# Patient Record
Sex: Female | Born: 1945 | Race: White | Hispanic: No | Marital: Married | Smoking: Current every day smoker
Health system: Southern US, Community
[De-identification: ages and names within clinical notes are randomized; demographics above are authoritative.]

## PROBLEM LIST (undated history)

## (undated) DIAGNOSIS — F439 Reaction to severe stress, unspecified: Secondary | ICD-10-CM

## (undated) DIAGNOSIS — J449 Chronic obstructive pulmonary disease, unspecified: Secondary | ICD-10-CM

## (undated) DIAGNOSIS — I700 Atherosclerosis of aorta: Secondary | ICD-10-CM

## (undated) DIAGNOSIS — M545 Low back pain, unspecified: Secondary | ICD-10-CM

## (undated) DIAGNOSIS — E119 Type 2 diabetes mellitus without complications: Secondary | ICD-10-CM

## (undated) DIAGNOSIS — K219 Gastro-esophageal reflux disease without esophagitis: Secondary | ICD-10-CM

## (undated) DIAGNOSIS — E1140 Type 2 diabetes mellitus with diabetic neuropathy, unspecified: Secondary | ICD-10-CM

## (undated) DIAGNOSIS — F1210 Cannabis abuse, uncomplicated: Secondary | ICD-10-CM

## (undated) DIAGNOSIS — Z7902 Long term (current) use of antithrombotics/antiplatelets: Secondary | ICD-10-CM

## (undated) DIAGNOSIS — G8929 Other chronic pain: Secondary | ICD-10-CM

## (undated) DIAGNOSIS — N200 Calculus of kidney: Secondary | ICD-10-CM

## (undated) DIAGNOSIS — M961 Postlaminectomy syndrome, not elsewhere classified: Secondary | ICD-10-CM

## (undated) DIAGNOSIS — M7542 Impingement syndrome of left shoulder: Secondary | ICD-10-CM

## (undated) DIAGNOSIS — F329 Major depressive disorder, single episode, unspecified: Secondary | ICD-10-CM

## (undated) DIAGNOSIS — I493 Ventricular premature depolarization: Secondary | ICD-10-CM

## (undated) DIAGNOSIS — G4730 Sleep apnea, unspecified: Secondary | ICD-10-CM

## (undated) DIAGNOSIS — Z720 Tobacco use: Secondary | ICD-10-CM

## (undated) DIAGNOSIS — G4733 Obstructive sleep apnea (adult) (pediatric): Secondary | ICD-10-CM

## (undated) DIAGNOSIS — M7582 Other shoulder lesions, left shoulder: Secondary | ICD-10-CM

## (undated) DIAGNOSIS — I447 Left bundle-branch block, unspecified: Secondary | ICD-10-CM

## (undated) DIAGNOSIS — I219 Acute myocardial infarction, unspecified: Secondary | ICD-10-CM

## (undated) DIAGNOSIS — F32A Depression, unspecified: Secondary | ICD-10-CM

## (undated) DIAGNOSIS — I5181 Takotsubo syndrome: Secondary | ICD-10-CM

## (undated) DIAGNOSIS — E785 Hyperlipidemia, unspecified: Secondary | ICD-10-CM

## (undated) DIAGNOSIS — E039 Hypothyroidism, unspecified: Secondary | ICD-10-CM

## (undated) DIAGNOSIS — N281 Cyst of kidney, acquired: Secondary | ICD-10-CM

## (undated) DIAGNOSIS — K5790 Diverticulosis of intestine, part unspecified, without perforation or abscess without bleeding: Secondary | ICD-10-CM

## (undated) DIAGNOSIS — N183 Chronic kidney disease, stage 3 unspecified: Secondary | ICD-10-CM

## (undated) DIAGNOSIS — I2510 Atherosclerotic heart disease of native coronary artery without angina pectoris: Secondary | ICD-10-CM

## (undated) DIAGNOSIS — I10 Essential (primary) hypertension: Secondary | ICD-10-CM

## (undated) DIAGNOSIS — H269 Unspecified cataract: Secondary | ICD-10-CM

## (undated) DIAGNOSIS — Z955 Presence of coronary angioplasty implant and graft: Secondary | ICD-10-CM

## (undated) HISTORY — DX: Hypothyroidism, unspecified: E03.9

## (undated) HISTORY — DX: Low back pain, unspecified: M54.50

## (undated) HISTORY — DX: Other chronic pain: G89.29

## (undated) HISTORY — DX: Hyperlipidemia, unspecified: E78.5

## (undated) HISTORY — DX: Major depressive disorder, single episode, unspecified: F32.9

## (undated) HISTORY — DX: Low back pain: M54.5

## (undated) HISTORY — DX: Unspecified cataract: H26.9

## (undated) HISTORY — PX: APPENDECTOMY: SHX54

## (undated) HISTORY — DX: Tobacco use: Z72.0

## (undated) HISTORY — PX: ABDOMINAL HYSTERECTOMY: SHX81

## (undated) HISTORY — DX: Atherosclerotic heart disease of native coronary artery without angina pectoris: I25.10

## (undated) HISTORY — DX: Diverticulosis of intestine, part unspecified, without perforation or abscess without bleeding: K57.90

## (undated) HISTORY — DX: Essential (primary) hypertension: I10

## (undated) HISTORY — DX: Depression, unspecified: F32.A

## (undated) HISTORY — PX: EYE SURGERY: SHX253

## (undated) HISTORY — PX: BACK SURGERY: SHX140

---

## 2002-01-12 ENCOUNTER — Encounter
Admission: RE | Admit: 2002-01-12 | Discharge: 2002-01-12 | Payer: Self-pay | Admitting: Physical Medicine and Rehabilitation

## 2002-01-12 ENCOUNTER — Encounter: Payer: Self-pay | Admitting: Physical Medicine and Rehabilitation

## 2002-02-14 ENCOUNTER — Encounter: Payer: Self-pay | Admitting: Orthopedic Surgery

## 2002-02-21 ENCOUNTER — Inpatient Hospital Stay (HOSPITAL_COMMUNITY): Admission: RE | Admit: 2002-02-21 | Discharge: 2002-02-22 | Payer: Self-pay | Admitting: Orthopedic Surgery

## 2002-02-21 ENCOUNTER — Encounter: Payer: Self-pay | Admitting: Orthopedic Surgery

## 2004-05-22 ENCOUNTER — Ambulatory Visit: Payer: Self-pay | Admitting: Pain Medicine

## 2004-06-20 ENCOUNTER — Ambulatory Visit: Payer: Self-pay | Admitting: Pain Medicine

## 2004-07-18 ENCOUNTER — Ambulatory Visit: Payer: Self-pay | Admitting: Pain Medicine

## 2004-07-29 ENCOUNTER — Ambulatory Visit: Payer: Self-pay | Admitting: Pain Medicine

## 2004-08-13 ENCOUNTER — Ambulatory Visit: Payer: Self-pay | Admitting: Pain Medicine

## 2004-09-16 ENCOUNTER — Ambulatory Visit: Payer: Self-pay | Admitting: Pain Medicine

## 2004-10-10 ENCOUNTER — Ambulatory Visit: Payer: Self-pay | Admitting: Pain Medicine

## 2004-10-30 ENCOUNTER — Ambulatory Visit: Payer: Self-pay | Admitting: Pain Medicine

## 2004-11-14 ENCOUNTER — Ambulatory Visit: Payer: Self-pay | Admitting: Pain Medicine

## 2006-08-13 ENCOUNTER — Ambulatory Visit: Payer: Self-pay | Admitting: Pain Medicine

## 2006-08-26 ENCOUNTER — Ambulatory Visit: Payer: Self-pay | Admitting: Pain Medicine

## 2006-10-08 ENCOUNTER — Ambulatory Visit: Payer: Self-pay | Admitting: Pain Medicine

## 2006-10-15 ENCOUNTER — Ambulatory Visit: Payer: Self-pay | Admitting: Pain Medicine

## 2006-10-19 ENCOUNTER — Ambulatory Visit: Payer: Self-pay | Admitting: Pain Medicine

## 2006-11-28 ENCOUNTER — Inpatient Hospital Stay: Payer: Self-pay | Admitting: Internal Medicine

## 2006-11-28 ENCOUNTER — Other Ambulatory Visit: Payer: Self-pay

## 2006-12-03 ENCOUNTER — Ambulatory Visit: Payer: Self-pay | Admitting: Pain Medicine

## 2006-12-07 ENCOUNTER — Ambulatory Visit: Payer: Self-pay | Admitting: Pain Medicine

## 2006-12-30 ENCOUNTER — Ambulatory Visit: Payer: Self-pay | Admitting: Pain Medicine

## 2007-01-28 ENCOUNTER — Ambulatory Visit: Payer: Self-pay | Admitting: Pain Medicine

## 2007-02-01 ENCOUNTER — Ambulatory Visit: Payer: Self-pay | Admitting: Pain Medicine

## 2007-02-03 ENCOUNTER — Ambulatory Visit: Payer: Self-pay | Admitting: Pain Medicine

## 2007-03-02 ENCOUNTER — Ambulatory Visit: Payer: Self-pay | Admitting: Pain Medicine

## 2007-03-08 ENCOUNTER — Ambulatory Visit: Payer: Self-pay | Admitting: Pain Medicine

## 2007-03-15 ENCOUNTER — Ambulatory Visit: Payer: Self-pay

## 2007-03-18 ENCOUNTER — Ambulatory Visit: Payer: Self-pay | Admitting: Pain Medicine

## 2007-04-30 ENCOUNTER — Ambulatory Visit: Payer: Self-pay | Admitting: Pain Medicine

## 2007-05-10 ENCOUNTER — Ambulatory Visit: Payer: Self-pay | Admitting: Pain Medicine

## 2007-05-27 ENCOUNTER — Ambulatory Visit: Payer: Self-pay | Admitting: Pain Medicine

## 2007-05-31 ENCOUNTER — Ambulatory Visit: Payer: Self-pay | Admitting: Internal Medicine

## 2007-06-02 ENCOUNTER — Ambulatory Visit: Payer: Self-pay | Admitting: Pain Medicine

## 2007-06-09 ENCOUNTER — Ambulatory Visit: Payer: Self-pay | Admitting: Pain Medicine

## 2007-06-22 ENCOUNTER — Ambulatory Visit: Payer: Self-pay | Admitting: Pain Medicine

## 2007-07-07 ENCOUNTER — Ambulatory Visit: Payer: Self-pay | Admitting: Pain Medicine

## 2007-09-02 ENCOUNTER — Ambulatory Visit: Payer: Self-pay | Admitting: Pain Medicine

## 2007-09-22 ENCOUNTER — Ambulatory Visit: Payer: Self-pay | Admitting: Pain Medicine

## 2007-09-29 ENCOUNTER — Ambulatory Visit: Payer: Self-pay | Admitting: Pain Medicine

## 2007-10-20 ENCOUNTER — Ambulatory Visit: Payer: Self-pay | Admitting: Pain Medicine

## 2008-01-18 ENCOUNTER — Ambulatory Visit: Payer: Self-pay | Admitting: Internal Medicine

## 2008-03-17 ENCOUNTER — Encounter
Admission: RE | Admit: 2008-03-17 | Discharge: 2008-03-17 | Payer: Self-pay | Admitting: Physical Medicine & Rehabilitation

## 2009-05-04 ENCOUNTER — Ambulatory Visit: Payer: Self-pay | Admitting: Internal Medicine

## 2009-05-16 ENCOUNTER — Ambulatory Visit: Payer: Self-pay | Admitting: Internal Medicine

## 2009-05-29 ENCOUNTER — Ambulatory Visit (HOSPITAL_COMMUNITY): Admission: RE | Admit: 2009-05-29 | Discharge: 2009-05-29 | Payer: Self-pay | Admitting: Neurology

## 2010-07-21 HISTORY — PX: CORONARY ANGIOPLASTY: SHX604

## 2010-10-23 LAB — GLUCOSE, CAPILLARY: Glucose-Capillary: 142 mg/dL — ABNORMAL HIGH (ref 70–99)

## 2010-12-06 NOTE — Op Note (Signed)
Sarah Payne, Sarah Payne                          ACCOUNT NO.:  [id number]   MEDICAL RECORD NO.:  [id number]                   PATIENT TYPE:  AMB   LOCATION:  DAY                                  FACILITY:  [facility]   PHYSICIAN:  Ronald A. Darrelyn Hillock, M.D.             DATE OF BIRTH:  [birth year]   DATE OF PROCEDURE:  02/21/2002  DATE OF DISCHARGE:                                 OPERATIVE REPORT   SURGEON:  Georges Lynch. Darrelyn Hillock, M.D.   ASSISTANT:  Patricia Nettle, M.D.   PREOPERATIVE DIAGNOSES:  1. Bulging disk L4-5, central.  2. 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   POSTOPERATIVE DIAGNOSES:  1. Bulging disk L4-5, central.  2. 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   OPERATION:  1. Complete bilateral decompressive lumbar laminectomy and foraminotomies.  2. Exploration of the L4-5 disk space.  No diskectomy was done.   DESCRIPTION OF PROCEDURE:  Under general anesthesia, a routine orthopedic  prep and drape of the back was carried out.  The patient had 1 g of IV Ancef  preop.  Two needles were placed in the back for localization purposes.  X-  ray was taken to verify our position.  Once we went down and stripped the  muscle from the lamina and spinous process, the spinous process was tagged  with a Kocher clamp.  Another x-ray was taken to verify our L4-5 position.  I then excised the spinous process of L4 and did a complete decompression of  L4-5 interspace.  We went above and below the space to make sure there was  no further stenosis.  Basically the problem also was a lateral recess  stenosis.  We decompressed the lateral recesses until we were out even with  the pedicles.  We were able to easily identify both nerve roots.  We traced  those out through the foramen.  We did foraminotomies.  The roots were free.  We then examined the disk space.  She had a bulging disk, but there was no  definite rupture, no compression on the root.  We felt  for stability  purposes to leave well enough alone.  The roots were now nicely  decompressed.  We thoroughly irrigated out the area; another x-ray was taken  at the end to verify our exact position.  We marked the disk space with a  D'Errico.  After this, I then loosely applied some thrombin-soaked Gelfoam  over the dura and closed the wound in layers in the usual fashion.  I left  the superior deep part of the wound open for drainage purposes.  Sterile  dressings were applied.  The patient left the operating room in satisfactory  condition.  She had 30 mg IV Toradol prior to discharge from the OR.  Ronald A. Darrelyn Hillock, M.D.    RAG/MEDQ  D:  02/21/2002  T:  02/25/2002  Job:  [id number]

## 2010-12-06 NOTE — H&P (Signed)
[facility]  Patient:    Sarah Payne, Sarah Payne. Visit Number: [id number] MRN: [id number]          Service Type: Attending:  Georges Lynch. Darrelyn Hillock, M.D. Dictated by:   Druscilla Brownie. Shela Nevin, P.A. Adm. Date:  02/21/02                           History and Physical  DATE OF BIRTH:  05/21/1946  CHIEF COMPLAINT:  "Pain in my back and leg."  HISTORY OF PRESENT ILLNESS:  This 65 year old female has been having progressive problems concerning pain in her low back as well as lower extremity.  The patient has had documented lumbar degenerative disk disease at L3-4 and L4-5.  She has been treated by Dr. Sheran Luz for intradiskal as well as other steroid injections to the lumbar spine.  Unfortunately, she has not benefited with surgical intervention; specifically, now with at least two Celebrex per day.  She also has to use Norco 10 mg for her discomfort.  Due to progressive problems and the fact that she is not relieved with pain medications as well as the previous steroid injections, it was felt she would benefit from surgical intervention and is being admitted for a central decompressive lumbar laminectomy L4-5, with microdiskectomy at the same level. Dr. Noel Gerold will assist.  PAST MEDICAL HISTORY:  The patient has been in relatively good health throughout her lifetime.  She now has: 1. Diabetes type 2. 2. Hypothyroidism. 3. Hypertension.  PAST SURGICAL HISTORY: 1. Bilateral oophorectomy. 2. Hysterectomy. 3. Appendectomy.  CURRENT MEDICATIONS: 1. Hydrocodone for pain. 2. Flexeril as a muscle relaxant. 3. Paxil 25 mg 1 q.d. 4. ______ 4 mg/500 mg 1 b.i.d. 5. Synthroid 112 mcg 1 q.d. 6. Protonix 40 mg 1 q.d.  7. Altace 10 mg 1 b.i.d.  8. Celebrex 200 mg b.i.d.  9. Premarin 0.625 mg q.d. 10. Pravachol 40 mg 1 q.d. 11. Paxil CR 25 mg q.d.  ALLERGIES:  No known drug allergies.  SOCIAL HISTORY:  The patient smokes one pack of cigarettes per day.  Has  no intake of ETOH.  FAMILY HISTORY:  Positive for stroke and MI in the mother.  FAMILY PHYSICIAN:  Dr. Shella Spearing in [place].  REVIEW OF SYSTEMS:  CNS:  No seizure disorder, paralysis, double vision.  The patient has a radiculopathy consistent with her spinal stenosis at L4-5. CARDIOVASCULAR:  No chest pain, no angina, no orthopnea.  GASTROINTESTINAL: No nausea, vomiting, melena, bloody stools.  GENITOURINARY:  No discharge, dysuria, hematuria.  MUSCULOSKELETAL:  Primarily in present illness.  PHYSICAL EXAMINATION:  GENERAL:  Alert, cooperative, friendly.  Somewhat uncomfortable 65 year old white female.  VITAL SIGNS:  Blood pressure 142/90, pulse 80, respirations 12.  HEENT:  Normocephalic.  PERRLA.  EOMs intact.  Oropharynx is clear.  CHEST:  Clear to auscultation.  No rhonchi.  No rales.  HEART:  Regular rate and rhythm.  No murmurs are heard.  ABDOMEN:  Soft and nontender.  Liver, spleen not felt.  GENITOURINARY, RECTAL, PELVIC, BREASTS:  Not done, not pertinent to present illness.  ADMISSION DIAGNOSES:  1. Herniated nucleus pulposus centrally with stenosis at L4-5.  2. Hypertension.  3. Hypothyroidism.  4. Type 2 diabetes.  PLAN:  The patient will be admitted for complete decompression lumbar laminectomy with microdiskectomy at L4-5.  Dr. Noel Gerold will assist.  Will probably put her on a sliding scale postoperatively. Dictated by:   Druscilla Brownie. Underwood III, P.A.  Attending:  Georges Lynch. Darrelyn Hillock, M.D. DD:  02/10/02 TD:  02/14/02 Job: [id number]

## 2011-01-31 ENCOUNTER — Ambulatory Visit: Admitting: Internal Medicine

## 2011-02-05 ENCOUNTER — Inpatient Hospital Stay: Admitting: Internal Medicine

## 2011-02-06 DIAGNOSIS — R7989 Other specified abnormal findings of blood chemistry: Secondary | ICD-10-CM

## 2011-02-06 DIAGNOSIS — R079 Chest pain, unspecified: Secondary | ICD-10-CM

## 2011-02-06 DIAGNOSIS — I2510 Atherosclerotic heart disease of native coronary artery without angina pectoris: Secondary | ICD-10-CM

## 2011-02-07 DIAGNOSIS — I214 Non-ST elevation (NSTEMI) myocardial infarction: Secondary | ICD-10-CM

## 2011-02-10 DIAGNOSIS — R0602 Shortness of breath: Secondary | ICD-10-CM

## 2011-02-12 ENCOUNTER — Inpatient Hospital Stay: Admitting: Internal Medicine

## 2011-02-12 DIAGNOSIS — R079 Chest pain, unspecified: Secondary | ICD-10-CM

## 2011-02-24 ENCOUNTER — Ambulatory Visit (INDEPENDENT_AMBULATORY_CARE_PROVIDER_SITE_OTHER): Payer: Medicare Other | Admitting: Cardiovascular Disease

## 2011-02-24 ENCOUNTER — Encounter: Payer: Self-pay | Admitting: Cardiovascular Disease

## 2011-02-24 DIAGNOSIS — I2510 Atherosclerotic heart disease of native coronary artery without angina pectoris: Secondary | ICD-10-CM | POA: Insufficient documentation

## 2011-02-24 DIAGNOSIS — Z720 Tobacco use: Secondary | ICD-10-CM | POA: Insufficient documentation

## 2011-02-24 DIAGNOSIS — E782 Mixed hyperlipidemia: Secondary | ICD-10-CM | POA: Insufficient documentation

## 2011-02-24 DIAGNOSIS — F17200 Nicotine dependence, unspecified, uncomplicated: Secondary | ICD-10-CM

## 2011-02-24 DIAGNOSIS — R42 Dizziness and giddiness: Secondary | ICD-10-CM

## 2011-02-24 DIAGNOSIS — IMO0001 Reserved for inherently not codable concepts without codable children: Secondary | ICD-10-CM

## 2011-02-24 DIAGNOSIS — E785 Hyperlipidemia, unspecified: Secondary | ICD-10-CM | POA: Insufficient documentation

## 2011-02-24 NOTE — Assessment & Plan Note (Signed)
She does have symptoms of dizziness and his orthostatic on today's visit with systolic pressures in the 90s with standing. We have suggested she liberalize her salt intake and monitor her blood pressure. If she continues to be orthostatic, we could consider Florinef.

## 2011-02-24 NOTE — Progress Notes (Signed)
Patient ID: Sarah Payne, female    DOB: 07-15-1946, 65 y.o.   MRN: [id number]  HPI Comments: 65 year old woman with a long history of smoking, coronary artery disease, diabetes depression, hypertension, chronic low back pain who recently slid down several stairs and hurt her back presented to [facility] with nausea vomiting shortness of breath and chest pain, elevated cardiac enzymes, taken to the cardiac Cath Lab showing an occluded mid left circumflex with stent x3 placed all drug-eluting stents with residual moderate to severe proximal to mid LAD disease after the diagonal, moderate distal LAD disease, ejection fraction 50% we presented to [facility] one week later with chest pain with negative stress test at that time for ischemia who now presents to establish care in the office.  She reports that since her last seen her in the hospital, she has not had any further chest pain. She has stopped smoking 2 weeks ago and wears a nicotine patch. She has been dizzy dizzy with standing. She is not taking any medications for blood pressure or heart rate control secondary to hypotension. Overall she feels well apart from fatigue.  Echocardiogram done February 07, 2011 shows normal LV systolic function, diastolic dysfunction, moderate inferior wall hypokinesis, normal right ventricular systolic pressures.  EKG shows normal sinus rhythm with rate 80 beats per minute with T-wave abnormality in leads V3 to V6, one and aVL   Outpatient Encounter Prescriptions as of 02/24/2011  Medication Sig Dispense Refill  . aspirin 81 MG tablet Take 81 mg by mouth daily.        Marland Kitchen atorvastatin (LIPITOR) 80 MG tablet Take 80 mg by mouth daily.        . Calcium & Magnesium Carbonates (MYLANTA PO) Take by mouth.        . clopidogrel (PLAVIX) 75 MG tablet Take 75 mg by mouth daily.        . Cyanocobalamin (VITAMIN B-12) 1000 MCG SUBL Place under the tongue.        . DULoxetine (CYMBALTA) 30 MG capsule Take 30 mg by mouth daily.        Marland Kitchen  glipiZIDE (GLUCOTROL XL) 2.5 MG 24 hr tablet Take 2.5 mg by mouth daily.        Marland Kitchen levothyroxine (SYNTHROID, LEVOTHROID) 125 MCG tablet Take 125 mcg by mouth daily.        . magnesium oxide (MAG-OX) 400 MG tablet Take 400 mg by mouth daily.        . metFORMIN (GLUMETZA) 1000 MG (MOD) 24 hr tablet Take 1,000 mg by mouth 2 (two) times daily with a meal.        . metoCLOPramide (REGLAN) 10 MG tablet Take 10 mg by mouth 4 (four) times daily.        . nicotine (NICODERM CQ - DOSED IN MG/24 HOURS) 14 mg/24hr patch Place 1 patch onto the skin daily.        . nitroGLYCERIN (NITROSTAT) 0.4 MG SL tablet Place 0.4 mg under the tongue every 5 (five) minutes as needed.        . traZODone (DESYREL) 100 MG tablet Take 100 mg by mouth at bedtime.          Review of Systems  Constitutional: Negative.   HENT: Negative.   Eyes: Negative.   Respiratory: Negative.   Cardiovascular: Negative.   Gastrointestinal: Negative.   Musculoskeletal: Negative.   Skin: Negative.   Neurological: Negative.   Hematological: Negative.   Psychiatric/Behavioral: Negative.   All other systems  reviewed and are negative.    BP 93/66  Pulse 91  Ht 5\' 7"  (1.702 m)  Wt 134 lb (60.782 kg)  BMI 20.99 kg/m2  Physical Exam  Nursing note and vitals reviewed. Constitutional: She is oriented to person, place, and time. She appears well-developed and well-nourished.  HENT:  Head: Normocephalic.  Nose: Nose normal.  Mouth/Throat: Oropharynx is clear and moist.  Eyes: Conjunctivae are normal. Pupils are equal, round, and reactive to light.  Neck: Normal range of motion. Neck supple. No JVD present.  Cardiovascular: Normal rate, regular rhythm, S1 normal, S2 normal, normal heart sounds and intact distal pulses.  Exam reveals no gallop and no friction rub.   No murmur heard. Pulmonary/Chest: Effort normal and breath sounds normal. No respiratory distress. She has no wheezes. She has no rales. She exhibits no tenderness.    Abdominal: Soft. Bowel sounds are normal. She exhibits no distension. There is no tenderness.  Musculoskeletal: Normal range of motion. She exhibits no edema and no tenderness.  Lymphadenopathy:    She has no cervical adenopathy.  Neurological: She is alert and oriented to person, place, and time. Coordination normal.  Skin: Skin is warm and dry. No rash noted. No erythema.  Psychiatric: She has a normal mood and affect. Her behavior is normal. Judgment and thought content normal.         Assessment and Plan

## 2011-02-24 NOTE — Patient Instructions (Signed)
You are doing well. No medication changes were made. Please call us if you have new issues that need to be addressed before your next appt.  We will call you for a follow up Appt. In 6 months  

## 2011-02-24 NOTE — Assessment & Plan Note (Signed)
She stopped smoking 2 weeks ago. We have congratulated her on smoking cessation

## 2011-02-24 NOTE — Assessment & Plan Note (Signed)
No further episodes of chest pain. Will continue aggressive medical management.

## 2011-02-24 NOTE — Assessment & Plan Note (Signed)
We have suggested she check her cholesterol in several months time on the higher dose Lipitor.

## 2011-03-11 ENCOUNTER — Ambulatory Visit (INDEPENDENT_AMBULATORY_CARE_PROVIDER_SITE_OTHER): Payer: Medicare Other | Admitting: Internal Medicine

## 2011-03-11 ENCOUNTER — Encounter: Payer: Self-pay | Admitting: Internal Medicine

## 2011-03-11 VITALS — BP 126/80 | HR 66 | Temp 98.6°F | Resp 14 | Ht 66.0 in | Wt 125.5 lb

## 2011-03-11 DIAGNOSIS — R5381 Other malaise: Secondary | ICD-10-CM

## 2011-03-11 DIAGNOSIS — IMO0001 Reserved for inherently not codable concepts without codable children: Secondary | ICD-10-CM

## 2011-03-11 DIAGNOSIS — R42 Dizziness and giddiness: Secondary | ICD-10-CM

## 2011-03-11 DIAGNOSIS — I2510 Atherosclerotic heart disease of native coronary artery without angina pectoris: Secondary | ICD-10-CM

## 2011-03-11 DIAGNOSIS — E039 Hypothyroidism, unspecified: Secondary | ICD-10-CM

## 2011-03-11 DIAGNOSIS — Z79899 Other long term (current) drug therapy: Secondary | ICD-10-CM

## 2011-03-11 DIAGNOSIS — R5383 Other fatigue: Secondary | ICD-10-CM

## 2011-03-11 DIAGNOSIS — E785 Hyperlipidemia, unspecified: Secondary | ICD-10-CM

## 2011-03-11 DIAGNOSIS — E119 Type 2 diabetes mellitus without complications: Secondary | ICD-10-CM

## 2011-03-11 DIAGNOSIS — F17200 Nicotine dependence, unspecified, uncomplicated: Secondary | ICD-10-CM

## 2011-03-11 NOTE — Progress Notes (Signed)
  Subjective:    Patient ID: Sarah Payne, female    DOB: [birth year], 65 y.o.   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  She had a readmission for severe hypotension secondary to medications and was discharged withouta  betablocker and ACE Inhibitor.  She has had her hospital followup with Dr. Mariah Milling who made no medication changes. She feels generally fatigued but has no dyspnea or chest pain since last discharged.  She quit smoking after her AMI .  Appetite is good.  No LE swelling .    Blood sugars have been well controlled.  Review of Systems  Constitutional: Positive for fatigue. Negative for fever, chills and appetite change.  HENT: Negative for hearing loss, ear pain, nosebleeds, congestion, sore throat, facial swelling, rhinorrhea, sneezing, mouth sores, trouble swallowing, neck pain, neck stiffness, voice change, postnasal drip, sinus pressure, tinnitus and ear discharge.   Eyes: Negative for pain, discharge, redness and visual disturbance.  Respiratory: Negative for cough, chest tightness, shortness of breath, wheezing and stridor.   Cardiovascular: Negative for chest pain, palpitations and leg swelling.  Genitourinary: Negative for frequency.  Musculoskeletal: Positive for back pain. Negative for myalgias and arthralgias.  Skin: Negative for color change and rash.  Neurological: Positive for weakness. Negative for dizziness, light-headedness and headaches.  Hematological: Negative for adenopathy. Bruises/bleeds easily.  Psychiatric/Behavioral: Negative for sleep disturbance. The patient is not nervous/anxious.        Objective:   Physical Exam  Constitutional: She is oriented to person, place, and time. She appears well-developed and well-nourished. No distress.  Eyes: EOM are normal. Pupils are equal, round, and reactive to light.  Neck: Normal range of  motion. Neck supple. No JVD present. No thyromegaly present.  Cardiovascular: Normal rate, regular rhythm, normal heart sounds and intact distal pulses.   Pulmonary/Chest: Effort normal and breath sounds normal. She exhibits no tenderness.  Abdominal: Soft. Bowel sounds are normal.  Musculoskeletal: Normal range of motion. She exhibits no edema.  Neurological: She is alert and oriented to person, place, and time.  Skin: Skin is warm and dry. She is not diaphoretic.  Psychiatric: She has a normal mood and affect.          Assessment & Plan:

## 2011-03-11 NOTE — Patient Instructions (Signed)
Try reducing the trazadone to 1/2 tablet at bedtime . We will refer you for a sleep study and for cardiopulmonary rehabilitation. Return for fasting bloodwork at your convenience.

## 2011-03-12 DIAGNOSIS — R5383 Other fatigue: Secondary | ICD-10-CM | POA: Insufficient documentation

## 2011-03-12 NOTE — Assessment & Plan Note (Signed)
Applauded her for her tobacco cessation.  Continue nicotine patches as needed

## 2011-03-12 NOTE — Assessment & Plan Note (Signed)
Currently asymptomatic.  BP still too soft to start beta blocker or ACE inhibitor.

## 2011-03-12 NOTE — Assessment & Plan Note (Signed)
Secondary to oversedation with trazadone as well as deconditioning and history of DM with vascular dementia all likely contributing.  Decrease trazadone dose.

## 2011-03-12 NOTE — Assessment & Plan Note (Signed)
Repeat lipids due on current lipitor dose.

## 2011-03-18 ENCOUNTER — Other Ambulatory Visit: Payer: Self-pay | Admitting: Internal Medicine

## 2011-03-18 ENCOUNTER — Other Ambulatory Visit (INDEPENDENT_AMBULATORY_CARE_PROVIDER_SITE_OTHER): Payer: Medicare Other | Admitting: *Deleted

## 2011-03-18 DIAGNOSIS — E039 Hypothyroidism, unspecified: Secondary | ICD-10-CM

## 2011-03-18 DIAGNOSIS — E119 Type 2 diabetes mellitus without complications: Secondary | ICD-10-CM

## 2011-03-18 DIAGNOSIS — I2510 Atherosclerotic heart disease of native coronary artery without angina pectoris: Secondary | ICD-10-CM

## 2011-03-18 DIAGNOSIS — Z79899 Other long term (current) drug therapy: Secondary | ICD-10-CM

## 2011-03-18 LAB — COMPREHENSIVE METABOLIC PANEL
ALT: 16 U/L (ref 0–35)
AST: 19 U/L (ref 0–37)
Albumin: 4.1 g/dL (ref 3.5–5.2)
Alkaline Phosphatase: 66 U/L (ref 39–117)
BUN: 21 mg/dL (ref 6–23)
CO2: 28 mEq/L (ref 19–32)
Calcium: 9.2 mg/dL (ref 8.4–10.5)
Chloride: 101 mEq/L (ref 96–112)
Creatinine, Ser: 1 mg/dL (ref 0.4–1.2)
GFR: 60.2 mL/min (ref >60.00)
Glucose, Bld: 157 mg/dL — ABNORMAL HIGH (ref 70–99)
Potassium: 4 mEq/L (ref 3.5–5.1)
Sodium: 140 mEq/L (ref 135–145)
Total Bilirubin: 0.4 mg/dL (ref 0.3–1.2)
Total Protein: 7.2 g/dL (ref 6.0–8.3)

## 2011-03-18 LAB — LIPID PANEL
Cholesterol: 203 mg/dL — ABNORMAL HIGH (ref 0–200)
HDL: 61 mg/dL (ref >39.00)
Total CHOL/HDL Ratio: 3
Triglycerides: 206 mg/dL — ABNORMAL HIGH (ref 0.0–149.0)
VLDL: 41.2 mg/dL — ABNORMAL HIGH (ref 0.0–40.0)

## 2011-03-18 LAB — TSH: TSH: 63.8 u[IU]/mL — ABNORMAL HIGH (ref 0.35–5.50)

## 2011-03-18 LAB — HEMOGLOBIN A1C: Hgb A1c MFr Bld: 7.4 % — ABNORMAL HIGH (ref 4.6–6.5)

## 2011-03-18 LAB — LDL CHOLESTEROL, DIRECT: Direct LDL: 123 mg/dL

## 2011-03-18 LAB — CK: Total CK: 99 U/L (ref 7–177)

## 2011-03-18 NOTE — Telephone Encounter (Signed)
WANT REFILL PAIN MEDS  [facility] [address]

## 2011-03-19 MED ORDER — LEVOTHYROXINE SODIUM 150 MCG PO TABS: 150.0000 ug | ORAL_TABLET | Freq: Every day | ORAL | Status: DC

## 2011-03-19 NOTE — Progress Notes (Signed)
Quick Note:  Thyroid function is very underactive on current synthroid dose. We need to increase her dose immediately. Confirm that she is taking it in the AM 30 minutes prior to other medications and all food. I will e mail new rx to pharmacy ______ 

## 2011-03-19 NOTE — Progress Notes (Signed)
Addended by: Duncan Dull on: 03/19/2011 06:38 PM   Modules accepted: Orders

## 2011-03-27 NOTE — Telephone Encounter (Signed)
I called pharmacy they stated her last refill was 01/14/11.

## 2011-03-28 MED ORDER — HYDROCODONE-ACETAMINOPHEN 5-500 MG PO TABS: 1.0000 | ORAL_TABLET | Freq: Two times a day (BID) | ORAL | Status: DC | PRN

## 2011-04-16 ENCOUNTER — Other Ambulatory Visit: Payer: Self-pay | Admitting: Internal Medicine

## 2011-04-16 MED ORDER — DONEPEZIL HCL 5 MG PO TABS: 5.0000 mg | ORAL_TABLET | Freq: Every day | ORAL | Status: DC

## 2011-04-22 ENCOUNTER — Encounter: Admitting: Cardiovascular Disease

## 2011-04-25 ENCOUNTER — Telehealth: Payer: Self-pay | Admitting: Internal Medicine

## 2011-04-25 NOTE — Telephone Encounter (Signed)
Patient needs to make an appt prior to an x ray being ordered.

## 2011-04-25 NOTE — Telephone Encounter (Signed)
[phone]  Pt called to get rx for her lower back pain she also would like  to get a xray on her back . [facility]

## 2011-04-28 NOTE — Telephone Encounter (Signed)
Appointment 04/30/11 with dr Darrick Huntsman

## 2011-04-30 ENCOUNTER — Ambulatory Visit (INDEPENDENT_AMBULATORY_CARE_PROVIDER_SITE_OTHER): Payer: Medicare Other | Admitting: Internal Medicine

## 2011-04-30 ENCOUNTER — Encounter: Payer: Self-pay | Admitting: Internal Medicine

## 2011-04-30 DIAGNOSIS — E039 Hypothyroidism, unspecified: Secondary | ICD-10-CM

## 2011-04-30 DIAGNOSIS — R5383 Other fatigue: Secondary | ICD-10-CM

## 2011-04-30 DIAGNOSIS — F329 Major depressive disorder, single episode, unspecified: Secondary | ICD-10-CM

## 2011-04-30 DIAGNOSIS — F17200 Nicotine dependence, unspecified, uncomplicated: Secondary | ICD-10-CM

## 2011-04-30 DIAGNOSIS — M549 Dorsalgia, unspecified: Secondary | ICD-10-CM

## 2011-04-30 DIAGNOSIS — F32A Depression, unspecified: Secondary | ICD-10-CM

## 2011-04-30 DIAGNOSIS — E785 Hyperlipidemia, unspecified: Secondary | ICD-10-CM

## 2011-04-30 DIAGNOSIS — R5381 Other malaise: Secondary | ICD-10-CM

## 2011-04-30 LAB — CBC WITH DIFFERENTIAL/PLATELET
Basophils Absolute: 0 10*3/uL (ref 0.0–0.1)
Basophils Relative: 0.3 % (ref 0.0–3.0)
Eosinophils Absolute: 0.1 10*3/uL (ref 0.0–0.7)
Eosinophils Relative: 1 % (ref 0.0–5.0)
HCT: 39.5 % (ref 36.0–46.0)
Hemoglobin: 12.9 g/dL (ref 12.0–15.0)
Lymphocytes Relative: 34.1 % (ref 12.0–46.0)
Lymphs Abs: 2.1 10*3/uL (ref 0.7–4.0)
MCHC: 32.7 g/dL (ref 30.0–36.0)
MCV: 96.6 fl (ref 78.0–100.0)
Monocytes Absolute: 0.4 10*3/uL (ref 0.1–1.0)
Monocytes Relative: 7.3 % (ref 3.0–12.0)
Neutro Abs: 3.5 10*3/uL (ref 1.4–7.7)
Neutrophils Relative %: 57.3 % (ref 43.0–77.0)
Platelets: 301 10*3/uL (ref 150.0–400.0)
RBC: 4.08 Mil/uL (ref 3.87–5.11)
RDW: 14 % (ref 11.5–14.6)
WBC: 6.1 10*3/uL (ref 4.5–10.5)

## 2011-04-30 LAB — LIPID PANEL
Cholesterol: 150 mg/dL (ref 0–200)
HDL: 41.5 mg/dL (ref >39.00)
LDL Cholesterol: 70 mg/dL (ref 0–99)
Total CHOL/HDL Ratio: 4
Triglycerides: 193 mg/dL — ABNORMAL HIGH (ref 0.0–149.0)
VLDL: 38.6 mg/dL (ref 0.0–40.0)

## 2011-04-30 LAB — COMPREHENSIVE METABOLIC PANEL
ALT: 12 U/L (ref 0–35)
AST: 14 U/L (ref 0–37)
Albumin: 4.2 g/dL (ref 3.5–5.2)
Alkaline Phosphatase: 63 U/L (ref 39–117)
BUN: 20 mg/dL (ref 6–23)
CO2: 27 mEq/L (ref 19–32)
Calcium: 9.2 mg/dL (ref 8.4–10.5)
Chloride: 103 mEq/L (ref 96–112)
Creatinine, Ser: 0.8 mg/dL (ref 0.4–1.2)
GFR: 76.96 mL/min (ref >60.00)
Glucose, Bld: 144 mg/dL — ABNORMAL HIGH (ref 70–99)
Potassium: 4.4 mEq/L (ref 3.5–5.1)
Sodium: 140 mEq/L (ref 135–145)
Total Bilirubin: 0.2 mg/dL — ABNORMAL LOW (ref 0.3–1.2)
Total Protein: 7.3 g/dL (ref 6.0–8.3)

## 2011-04-30 LAB — TSH: TSH: 0.73 u[IU]/mL (ref 0.35–5.50)

## 2011-04-30 MED ORDER — MIRTAZAPINE 15 MG PO TABS: 15.0000 mg | ORAL_TABLET | Freq: Every day | ORAL | Status: DC

## 2011-04-30 MED ORDER — OXYCODONE-ACETAMINOPHEN 7.5-325 MG PO TABS: 1.0000 | ORAL_TABLET | Freq: Every evening | ORAL | Status: DC | PRN

## 2011-04-30 NOTE — Patient Instructions (Signed)
Dr. Darrick Huntsman is adding mirtazipine at bedtime to help your depression and your energery level,  She is adding one percocet at bedtime to manage your back and hip pain.  She is referring you to Cardiopulmonary rehab, done at the hospital to help build your strength back up.  Return in one month.

## 2011-04-30 NOTE — Progress Notes (Signed)
Subjective:    Patient ID: Sarah Payne, female    DOB: February 08, 1946, 65 y.o.   MRN: [id number]  HPI  65 yo white female with histoy of early dementia, hypothyrodiism, tobacco abuse and CAD recently hospitalized for AMI, presents with 2 week history of feeling bad.  Seh has been taking her medications as prescribed and is no longer smoking, but feels despondent  ( "I feel unloved by myt husband") and reports frequent tearfulness.  Since her heart atack her husbaqnd has been very distant and detached.  Despite being in the home with her during the day he does not provide companionship or assistance in her day to day activities.  Her son is cooking her meals and her daughter is filling her medication boxes.  Physically she feels dyspneic with minimal exertion, walking on an even surface.  She has been referred to cardiopulmonary rehab but does not remember this referral and and has not attended any sessions sicne  Dc from hospitla in August. Past Medical History  Diagnosis Date  . Diabetes mellitus   . Hypertension   . Hypothyroidism   . Depression   . Vascular dementia   . Coronary artery disease    Current Outpatient Prescriptions on File Prior to Visit  Medication Sig Dispense Refill  . aspirin 81 MG tablet Take 81 mg by mouth daily.        . Calcium & Magnesium Carbonates (MYLANTA PO) Take by mouth.        . clopidogrel (PLAVIX) 75 MG tablet Take 75 mg by mouth daily.        . Cyanocobalamin (VITAMIN B-12) 1000 MCG SUBL Place under the tongue.        . donepezil (ARICEPT) 5 MG tablet Take 1 tablet (5 mg total) by mouth at bedtime.  30 tablet  5  . HYDROcodone-acetaminophen (VICODIN) 5-500 MG per tablet Take 1 tablet by mouth 2 (two) times daily as needed for pain.  60 tablet  3  . levothyroxine (SYNTHROID) 150 MCG tablet Take 1 tablet (150 mcg total) by mouth daily.  30 tablet  3  . magnesium oxide (MAG-OX) 400 MG tablet Take 400 mg by mouth daily.        . metFORMIN (GLUMETZA) 1000 MG (MOD)  24 hr tablet Take 1,000 mg by mouth 2 (two) times daily with a meal.        . nitroGLYCERIN (NITROSTAT) 0.4 MG SL tablet Place 0.4 mg under the tongue every 5 (five) minutes as needed.        . traZODone (DESYREL) 100 MG tablet Take 100 mg by mouth at bedtime.           Review of Systems  Constitutional: Negative for fever, chills and unexpected weight change.  HENT: Negative for hearing loss, ear pain, nosebleeds, congestion, sore throat, facial swelling, rhinorrhea, sneezing, mouth sores, trouble swallowing, neck pain, neck stiffness, voice change, postnasal drip, sinus pressure, tinnitus and ear discharge.   Eyes: Negative for pain, discharge, redness and visual disturbance.  Respiratory: Positive for shortness of breath. Negative for cough, chest tightness, wheezing and stridor.   Cardiovascular: Negative for chest pain, palpitations and leg swelling.  Musculoskeletal: Negative for myalgias and arthralgias.  Skin: Negative for color change and rash.  Neurological: Positive for weakness. Negative for dizziness, light-headedness and headaches.  Hematological: Negative for adenopathy.  Psychiatric/Behavioral: Positive for dysphoric mood.       Objective:   Physical Exam  Constitutional: She is oriented to person,  place, and time. She appears well-developed and well-nourished.  HENT:  Mouth/Throat: Oropharynx is clear and moist.  Eyes: EOM are normal. Pupils are equal, round, and reactive to light. No scleral icterus.  Neck: Normal range of motion. Neck supple. No JVD present. No thyromegaly present.  Cardiovascular: Normal rate, regular rhythm, normal heart sounds and intact distal pulses.   Pulmonary/Chest: Effort normal and breath sounds normal.  Abdominal: Soft. Bowel sounds are normal. She exhibits no mass. There is no tenderness.  Musculoskeletal: Normal range of motion. She exhibits no edema.  Lymphadenopathy:    She has no cervical adenopathy.  Neurological: She is alert and  oriented to person, place, and time.  Skin: Skin is warm and dry.  Psychiatric: She has a normal mood and affect.          Assessment & Plan:

## 2011-05-03 DIAGNOSIS — E039 Hypothyroidism, unspecified: Secondary | ICD-10-CM | POA: Insufficient documentation

## 2011-05-03 MED ORDER — PRAVASTATIN SODIUM 40 MG PO TABS: 40.0000 mg | ORAL_TABLET | Freq: Every evening | ORAL | Status: DC

## 2011-05-03 NOTE — Assessment & Plan Note (Signed)
Thyroid function is finally normalized on current dose of Synthroid,  No changes today.

## 2011-05-03 NOTE — Assessment & Plan Note (Signed)
She was referred to [facility] for C/P rehab at discharge but has not showed for her appt.  Her family will be contacted to remind them that she would benefit from PT>

## 2011-05-03 NOTE — Assessment & Plan Note (Addendum)
She was prescribed a statin at dc from [facility] in August but her current medications were reviewed and lacking. Current LDL is 123, goal i s70,  Will start pravastatin at 40 mg daily

## 2011-05-03 NOTE — Assessment & Plan Note (Signed)
She quit during recent hospitalization for AMI.

## 2011-05-12 ENCOUNTER — Other Ambulatory Visit: Payer: Self-pay | Admitting: Internal Medicine

## 2011-05-12 DIAGNOSIS — M549 Dorsalgia, unspecified: Secondary | ICD-10-CM

## 2011-05-12 MED ORDER — OXYCODONE-ACETAMINOPHEN 7.5-325 MG PO TABS: 1.0000 | ORAL_TABLET | Freq: Two times a day (BID) | ORAL | Status: DC | PRN

## 2011-05-12 NOTE — Telephone Encounter (Signed)
Patient is waiting on a referral for MRI.

## 2011-05-12 NOTE — Telephone Encounter (Signed)
Patient is taking  Percocet 2 times a day.

## 2011-06-10 ENCOUNTER — Ambulatory Visit: Payer: Medicare Other | Admitting: Internal Medicine

## 2011-06-10 DIAGNOSIS — Z0289 Encounter for other administrative examinations: Secondary | ICD-10-CM

## 2011-06-13 ENCOUNTER — Other Ambulatory Visit: Payer: Self-pay | Admitting: Internal Medicine

## 2011-06-13 MED ORDER — CARVEDILOL 3.125 MG PO TABS: 3.1250 mg | ORAL_TABLET | Freq: Two times a day (BID) | ORAL | Status: DC

## 2011-06-13 MED ORDER — ENALAPRIL MALEATE 10 MG PO TABS: 10.0000 mg | ORAL_TABLET | Freq: Every day | ORAL | Status: DC

## 2011-06-20 ENCOUNTER — Other Ambulatory Visit: Payer: Self-pay | Admitting: Internal Medicine

## 2011-06-20 DIAGNOSIS — M549 Dorsalgia, unspecified: Secondary | ICD-10-CM

## 2011-06-20 MED ORDER — OXYCODONE-ACETAMINOPHEN 7.5-325 MG PO TABS: 1.0000 | ORAL_TABLET | Freq: Two times a day (BID) | ORAL | Status: DC | PRN

## 2011-06-20 NOTE — Telephone Encounter (Signed)
Patient notified

## 2011-06-20 NOTE — Telephone Encounter (Signed)
She will need to make appt for the x ray.  i do not handle theses type of requests  by phone

## 2011-06-20 NOTE — Telephone Encounter (Signed)
Patient also wants an x-ray on her back.

## 2011-06-24 ENCOUNTER — Encounter: Payer: Self-pay | Admitting: Internal Medicine

## 2011-06-24 ENCOUNTER — Ambulatory Visit (INDEPENDENT_AMBULATORY_CARE_PROVIDER_SITE_OTHER): Payer: Medicare Other | Admitting: Internal Medicine

## 2011-06-24 VITALS — BP 114/60 | HR 63 | Temp 97.6°F | Resp 14 | Ht 66.0 in | Wt 142.0 lb

## 2011-06-24 DIAGNOSIS — M5126 Other intervertebral disc displacement, lumbar region: Secondary | ICD-10-CM

## 2011-06-24 DIAGNOSIS — M545 Low back pain, unspecified: Secondary | ICD-10-CM

## 2011-06-24 DIAGNOSIS — F329 Major depressive disorder, single episode, unspecified: Secondary | ICD-10-CM

## 2011-06-24 DIAGNOSIS — F32A Depression, unspecified: Secondary | ICD-10-CM | POA: Insufficient documentation

## 2011-06-24 DIAGNOSIS — F17200 Nicotine dependence, unspecified, uncomplicated: Secondary | ICD-10-CM

## 2011-06-24 MED ORDER — DULOXETINE HCL 60 MG PO CPEP: 60.0000 mg | ORAL_CAPSULE | Freq: Every day | ORAL | Status: DC

## 2011-06-24 MED ORDER — HYDROCODONE-ACETAMINOPHEN 5-500 MG PO TABS: 1.0000 | ORAL_TABLET | Freq: Four times a day (QID) | ORAL | Status: DC | PRN

## 2011-06-24 NOTE — Progress Notes (Signed)
Subjective:    Patient ID: Sarah Payne, female    DOB: 1945/08/23, 65 y.o.   MRN: [id number]  HPI  Sarah Payne is a 65 yo white female with a history of CAD s/p PTCA/stent for AMI  In 2012, hypothyroidism, and early dementia secondary to severe untreated hypothyroidism and vascular disease who presents with a 3 month history of low back pain that radiates to suprapubic area .  Her pain began after flexing to the right andwas accompanied by a popping sensation.   She has a history of prior lumbar spine surgery at [facility] several years ago with hardware , and is worried that the hardware has become loose. Her pain is constant, 10 of 10.   Has been taking vicodin for pain control which helps for about 4 to 5 hours . Past Medical History  Diagnosis Date  . Diabetes mellitus   . Hypertension   . Hypothyroidism   . Depression   . Vascular dementia   . Coronary artery disease    Current Outpatient Prescriptions on File Prior to Visit  Medication Sig Dispense Refill  . aspirin 81 MG tablet Take 81 mg by mouth daily.        . Calcium & Magnesium Carbonates (MYLANTA PO) Take by mouth.        . carvedilol (COREG) 3.125 MG tablet Take 1 tablet (3.125 mg total) by mouth 2 (two) times daily.  60 tablet  3  . Cyanocobalamin (VITAMIN B-12) 1000 MCG SUBL Place under the tongue.        . enalapril (VASOTEC) 10 MG tablet Take 1 tablet (10 mg total) by mouth daily.  30 tablet  3  . levothyroxine (SYNTHROID) 150 MCG tablet Take 1 tablet (150 mcg total) by mouth daily.  30 tablet  3  . magnesium oxide (MAG-OX) 400 MG tablet Take 400 mg by mouth daily.        . metFORMIN (GLUMETZA) 1000 MG (MOD) 24 hr tablet Take 1,000 mg by mouth 2 (two) times daily with a meal.        . nitroGLYCERIN (NITROSTAT) 0.4 MG SL tablet Place 0.4 mg under the tongue every 5 (five) minutes as needed.        . pravastatin (PRAVACHOL) 40 MG tablet Take 1 tablet (40 mg total) by mouth every evening.  30 tablet  11  . traZODone (DESYREL) 100  MG tablet Take 100 mg by mouth at bedtime.          Review of Systems  Constitutional: Negative for fever, chills and unexpected weight change.  HENT: Negative for hearing loss, ear pain, nosebleeds, congestion, sore throat, facial swelling, rhinorrhea, sneezing, mouth sores, trouble swallowing, neck pain, neck stiffness, voice change, postnasal drip, sinus pressure, tinnitus and ear discharge.   Eyes: Negative for pain, discharge, redness and visual disturbance.  Respiratory: Negative for cough, chest tightness, shortness of breath, wheezing and stridor.   Cardiovascular: Negative for chest pain, palpitations and leg swelling.  Musculoskeletal: Positive for back pain. Negative for myalgias and arthralgias.  Skin: Negative for color change and rash.  Neurological: Negative for dizziness, weakness, light-headedness and headaches.  Hematological: Negative for adenopathy.  Psychiatric/Behavioral: Positive for sleep disturbance.       Objective:   Physical Exam  Constitutional: She is oriented to person, place, and time. She appears well-developed and well-nourished.  HENT:  Mouth/Throat: Oropharynx is clear and moist.  Eyes: EOM are normal. Pupils are equal, round, and reactive to light. No scleral  icterus.  Neck: Normal range of motion. Neck supple. No JVD present. No thyromegaly present.  Cardiovascular: Normal rate, regular rhythm, normal heart sounds and intact distal pulses.   Pulmonary/Chest: Effort normal and breath sounds normal.  Abdominal: Soft. Bowel sounds are normal. She exhibits no mass. There is no tenderness.  Musculoskeletal: She exhibits tenderness. She exhibits no edema.       Lumbar back: She exhibits decreased range of motion, tenderness, pain and spasm.  Lymphadenopathy:    She has no cervical adenopathy.  Neurological: She is alert and oriented to person, place, and time.  Skin: Skin is warm and dry.  Psychiatric: She has a normal mood and affect.            Assessment & Plan:

## 2011-06-24 NOTE — Patient Instructions (Signed)
Please let me know if the cymbalta higher dose (60 mg ) is helping with your depression.  You can increase your vicodin to 4 pills daily  For control of back pain

## 2011-06-25 ENCOUNTER — Encounter: Payer: Self-pay | Admitting: Internal Medicine

## 2011-06-27 ENCOUNTER — Other Ambulatory Visit: Payer: Self-pay | Admitting: Internal Medicine

## 2011-06-27 MED ORDER — CLOPIDOGREL BISULFATE 75 MG PO TABS: 75.0000 mg | ORAL_TABLET | Freq: Every day | ORAL | Status: DC

## 2011-06-29 ENCOUNTER — Encounter: Payer: Self-pay | Admitting: Internal Medicine

## 2011-06-29 DIAGNOSIS — Z981 Arthrodesis status: Secondary | ICD-10-CM | POA: Insufficient documentation

## 2011-06-29 NOTE — Assessment & Plan Note (Signed)
She has had persistent to moderate to severe  back pain for the past several months which does not radiate to leg. Plain films ordered to rule out hardware displacement and vertebral fracture.

## 2011-06-29 NOTE — Assessment & Plan Note (Signed)
Worsening, likely aggravated by her constant pain.  She is tolerating cymbalta, will increase dose to 60 mg daily.

## 2011-06-29 NOTE — Assessment & Plan Note (Signed)
She has been abstinent since her AMI several months ago.

## 2011-07-04 ENCOUNTER — Telehealth: Payer: Self-pay | Admitting: Internal Medicine

## 2011-07-04 DIAGNOSIS — M545 Low back pain, unspecified: Secondary | ICD-10-CM

## 2011-07-04 MED ORDER — TRAMADOL HCL 50 MG PO TABS: 50.0000 mg | ORAL_TABLET | Freq: Four times a day (QID) | ORAL | Status: AC | PRN

## 2011-07-04 NOTE — Telephone Encounter (Signed)
[phone] Pt walked in wanted to get her rx for vicodin she only has 2 left [facility]

## 2011-07-04 NOTE — Telephone Encounter (Signed)
[name] was given an rx for 120 vicodin on Dec 4th.  If she has gone through 118 pills in 10 days she has taken entirely too many and may have damaged her liver from taking them in excess,  She cannot have any refills,  Because of her dementia I cannot give her any more narcotics at this time.  She should not take tylenol.  She can have tramadol 50 mg one tablet four times daily as needed for pain  Maximum 4 daily  .  You can call that in .

## 2011-07-04 NOTE — Telephone Encounter (Signed)
Patient notified. She will try the tramadol. Rx sent to pharmacy.

## 2011-07-09 ENCOUNTER — Other Ambulatory Visit: Payer: Self-pay | Admitting: *Deleted

## 2011-07-10 ENCOUNTER — Telehealth: Payer: Self-pay | Admitting: *Deleted

## 2011-07-10 DIAGNOSIS — M545 Low back pain, unspecified: Secondary | ICD-10-CM

## 2011-07-10 MED ORDER — HYDROCODONE-ACETAMINOPHEN 5-500 MG PO TABS: 1.0000 | ORAL_TABLET | Freq: Four times a day (QID) | ORAL | Status: DC | PRN

## 2011-07-10 NOTE — Telephone Encounter (Signed)
rx called in

## 2011-07-10 NOTE — Telephone Encounter (Signed)
Ok to refill the vicodin 5/500 one tablet every 6 hours prn pain maxium 4 daily  #120 1 refill

## 2011-07-10 NOTE — Telephone Encounter (Signed)
Refill  On Hydrocodone/Ace last filled on 06/11/11 #60

## 2011-07-11 NOTE — Telephone Encounter (Signed)
Rx called to pharmacy

## 2011-07-22 HISTORY — PX: CORONARY ANGIOPLASTY WITH STENT PLACEMENT: SHX49

## 2011-07-22 HISTORY — PX: CARDIAC CATHETERIZATION: SHX172

## 2011-07-25 ENCOUNTER — Other Ambulatory Visit: Payer: Self-pay | Admitting: Internal Medicine

## 2011-08-25 ENCOUNTER — Telehealth: Payer: Self-pay | Admitting: *Deleted

## 2011-08-25 NOTE — Telephone Encounter (Signed)
That's because her chart says she is taking trazodone.  she should not take both if she is stopping the trazodone he can call in prescription for mirtazapine 15 mg one tablet daily at bedtime quantity 30 with 3 refills

## 2011-08-25 NOTE — Telephone Encounter (Signed)
Refill request for mirtazapine 15 mg's.  Last filled 05/26/11 for quantity of 30.  Pharmacy is [facility].  This isn't on med list.

## 2011-08-25 NOTE — Telephone Encounter (Signed)
Per chart states that she is taking trazodone at bedtime she cannot take trazodone and mirtazapine. If she stops the trazodone she can take mirtazapine 15 mg one tablet daily quantity 30 with 3 refills

## 2011-08-27 ENCOUNTER — Ambulatory Visit (INDEPENDENT_AMBULATORY_CARE_PROVIDER_SITE_OTHER)
Admission: RE | Admit: 2011-08-27 | Discharge: 2011-08-27 | Disposition: A | Discharge status: Home / Self Care | Payer: Medicare Other | Source: Ambulatory Visit | Attending: Internal Medicine | Admitting: Internal Medicine

## 2011-08-27 ENCOUNTER — Encounter: Payer: Self-pay | Admitting: Internal Medicine

## 2011-08-27 ENCOUNTER — Ambulatory Visit (INDEPENDENT_AMBULATORY_CARE_PROVIDER_SITE_OTHER): Payer: Medicare Other | Admitting: Internal Medicine

## 2011-08-27 VITALS — BP 126/80 | HR 84 | Temp 98.4°F | Wt 145.0 lb

## 2011-08-27 DIAGNOSIS — M549 Dorsalgia, unspecified: Secondary | ICD-10-CM

## 2011-08-27 DIAGNOSIS — F17200 Nicotine dependence, unspecified, uncomplicated: Secondary | ICD-10-CM

## 2011-08-27 DIAGNOSIS — E039 Hypothyroidism, unspecified: Secondary | ICD-10-CM

## 2011-08-27 DIAGNOSIS — F329 Major depressive disorder, single episode, unspecified: Secondary | ICD-10-CM

## 2011-08-27 DIAGNOSIS — F32A Depression, unspecified: Secondary | ICD-10-CM

## 2011-08-27 DIAGNOSIS — E1159 Type 2 diabetes mellitus with other circulatory complications: Secondary | ICD-10-CM

## 2011-08-27 DIAGNOSIS — E1151 Type 2 diabetes mellitus with diabetic peripheral angiopathy without gangrene: Secondary | ICD-10-CM

## 2011-08-27 DIAGNOSIS — E559 Vitamin D deficiency, unspecified: Secondary | ICD-10-CM

## 2011-08-27 DIAGNOSIS — Z720 Tobacco use: Secondary | ICD-10-CM

## 2011-08-27 DIAGNOSIS — M5126 Other intervertebral disc displacement, lumbar region: Secondary | ICD-10-CM | POA: Diagnosis not present

## 2011-08-27 DIAGNOSIS — E538 Deficiency of other specified B group vitamins: Secondary | ICD-10-CM

## 2011-08-27 NOTE — Progress Notes (Signed)
Subjective:    Patient ID: Sarah Payne, female    DOB: 05/26/1946, 66 y.o.   MRN: [id number]  HPI  Sarah Payne is a 66 year old white female with a history of vascular dementia chronic low back pain with prior lumbar surgery coronary artery disease with recent acute MI now on Plavix post intervention who returns with persistent low back pain desiring further workup. At her last visit she was referred for x-rays but she did not get them. I explained to her today that she needs to bring her family with her when she comes to appointments because she does not remember what was discussed and does not show them after visit summary each visit. Her pain is persistent and located in the lower lumbar spine and both legs. It is aggravated by walking. It is partially removed with Vicodin. She has no history of recent fall or activity which may have aggravated it.  Past Medical History  Diagnosis Date  . Diabetes mellitus   . Hypertension   . Hypothyroidism   . Depression   . Vascular dementia   . Coronary artery disease     Past Surgical History  Procedure Date  . Back surgery   . Abdominal hysterectomy   . Appendectomy   . Cardiac catheterization   . Coronary angioplasty 2012    stent x 3     Current Outpatient Prescriptions on File Prior to Visit  Medication Sig Dispense Refill  . aspirin 81 MG tablet Take 81 mg by mouth daily.        . Calcium & Magnesium Carbonates (MYLANTA PO) Take by mouth.        . carvedilol (COREG) 3.125 MG tablet Take 1 tablet (3.125 mg total) by mouth 2 (two) times daily.  60 tablet  3  . clopidogrel (PLAVIX) 75 MG tablet Take 1 tablet (75 mg total) by mouth daily.  30 tablet  3  . Cyanocobalamin (VITAMIN B-12) 1000 MCG SUBL Place under the tongue.        . DULoxetine (CYMBALTA) 60 MG capsule Take 1 capsule (60 mg total) by mouth daily.  30 capsule  2  . enalapril (VASOTEC) 10 MG tablet Take 1 tablet (10 mg total) by mouth daily.  30 tablet  3  .  HYDROcodone-acetaminophen (VICODIN) 5-500 MG per tablet Take 1 tablet by mouth 4 (four) times daily as needed for pain.  120 tablet  1  . levothyroxine (SYNTHROID, LEVOTHROID) 150 MCG tablet TAKE 1 TABLET (150 MCG TOTAL) BY MOUTH DAILY.  30 tablet  3  . magnesium oxide (MAG-OX) 400 MG tablet Take 400 mg by mouth daily.        . metFORMIN (GLUMETZA) 1000 MG (MOD) 24 hr tablet Take 1,000 mg by mouth 2 (two) times daily with a meal.        . nitroGLYCERIN (NITROSTAT) 0.4 MG SL tablet Place 0.4 mg under the tongue every 5 (five) minutes as needed.        . pravastatin (PRAVACHOL) 40 MG tablet Take 1 tablet (40 mg total) by mouth every evening.  30 tablet  11  . traZODone (DESYREL) 100 MG tablet Take 1 tablet (100 mg total) by mouth at bedtime.  30 tablet  6    Review of Systems  Constitutional: Negative for fever, chills and unexpected weight change.  HENT: Negative for hearing loss, ear pain, nosebleeds, congestion, sore throat, facial swelling, rhinorrhea, sneezing, mouth sores, trouble swallowing, neck pain, neck stiffness, voice change, postnasal drip,  sinus pressure, tinnitus and ear discharge.   Eyes: Negative for pain, discharge, redness and visual disturbance.  Respiratory: Negative for cough, chest tightness, shortness of breath, wheezing and stridor.   Cardiovascular: Negative for chest pain, palpitations and leg swelling.  Musculoskeletal: Positive for back pain. Negative for myalgias and arthralgias.  Skin: Negative for color change and rash.  Neurological: Negative for dizziness, weakness, light-headedness and headaches.  Hematological: Negative for adenopathy.  Psychiatric/Behavioral: Positive for sleep disturbance.          Objective:   Physical Exam  Constitutional: She is oriented to person, place, and time. She appears well-developed and well-nourished.  HENT:  Mouth/Throat: Oropharynx is clear and moist.  Eyes: EOM are normal. Pupils are equal, round, and reactive to  light. No scleral icterus.  Neck: Normal range of motion. Neck supple. No JVD present. No thyromegaly present.  Cardiovascular: Normal rate, regular rhythm, normal heart sounds and intact distal pulses.   Pulmonary/Chest: Effort normal and breath sounds normal.  Abdominal: Soft. Bowel sounds are normal. She exhibits no mass. There is no tenderness.  Musculoskeletal: She exhibits tenderness. She exhibits no edema.       Lumbar back: She exhibits decreased range of motion, tenderness, pain and spasm.  Lymphadenopathy:    She has no cervical adenopathy.  Neurological: She is alert and oriented to person, place, and time.  Skin: Skin is warm and dry.  Psychiatric: She has a normal mood and affect.          Assessment & Plan:   Diabetes mellitus type 2 with peripheral artery disease Her diabetes has been well-controlled with diet and metformin.  She is due for hemoglobin A1c. She has resumed smoking despite her history of acute MI last year. She is taking aspirin Plavix statin and ACE inhibitor. We'll monitor her diabetic eye exam has been given.  We'll also need to consider that her bilateral lower extremity pain may not be radiation from her back to maybe do to peripheral vascular disease. If her plain films do not show evidence of significant stenosis we will make an evaluation for her with vascular surgery to assess her circulation.  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 Plain films have been ordered to evaluate her current source of pain. There is severe intervertebral disc disease noted on prior films will consider MRI of lumbar spine for further evaluation. Continue judicious use of Vicodin as needed for pain.  Depression Complicated by dementia and chronic pain. Continue Cymbalta and trazodone for insomnia.  Tobacco abuse She had been abstinent up until December secondary to suffering from a recent MI. She has  resumed smoking. I again counseled her on the risks for recurrent stenosis and recurrent MI. She does not appear to be contemplative about quitting currently.    Updated Medication List Outpatient Encounter Prescriptions as of 08/27/2011  Medication Sig Dispense Refill  . aspirin 81 MG tablet Take 81 mg by mouth daily.        . Calcium & Magnesium Carbonates (MYLANTA PO) Take by mouth.        . carvedilol (COREG) 3.125 MG tablet Take 1 tablet (3.125 mg total) by mouth 2 (two) times daily.  60 tablet  3  . clopidogrel (PLAVIX) 75 MG tablet Take 1 tablet (75 mg total) by mouth daily.  30 tablet  3  . Cyanocobalamin (VITAMIN B-12) 1000 MCG SUBL Place under the tongue.        Marland Kitchen  DULoxetine (CYMBALTA) 60 MG capsule Take 1 capsule (60 mg total) by mouth daily.  30 capsule  2  . enalapril (VASOTEC) 10 MG tablet Take 1 tablet (10 mg total) by mouth daily.  30 tablet  3  . HYDROcodone-acetaminophen (VICODIN) 5-500 MG per tablet Take 1 tablet by mouth 4 (four) times daily as needed for pain.  120 tablet  1  . levothyroxine (SYNTHROID, LEVOTHROID) 150 MCG tablet TAKE 1 TABLET (150 MCG TOTAL) BY MOUTH DAILY.  30 tablet  3  . magnesium oxide (MAG-OX) 400 MG tablet Take 400 mg by mouth daily.        . metFORMIN (GLUMETZA) 1000 MG (MOD) 24 hr tablet Take 1,000 mg by mouth 2 (two) times daily with a meal.        . nitroGLYCERIN (NITROSTAT) 0.4 MG SL tablet Place 0.4 mg under the tongue every 5 (five) minutes as needed.        . pravastatin (PRAVACHOL) 40 MG tablet Take 1 tablet (40 mg total) by mouth every evening.  30 tablet  11  . DISCONTD: traZODone (DESYREL) 100 MG tablet Take 100 mg by mouth at bedtime.

## 2011-08-27 NOTE — Telephone Encounter (Signed)
Pt states she is taking trazodone, doesn't want the mirtazepine.  She says that is on automatic refill at her pharmacy, I suggested she have them remove that so that we dont continue to get the refill requests.

## 2011-08-27 NOTE — Patient Instructions (Signed)
Please go to the [facility] at [place] to have your back x rayed today.  They are located on [address] , across from [employer] [employer] [address]

## 2011-08-28 ENCOUNTER — Other Ambulatory Visit: Payer: Self-pay | Admitting: *Deleted

## 2011-08-28 ENCOUNTER — Encounter: Payer: Self-pay | Admitting: Internal Medicine

## 2011-08-28 DIAGNOSIS — E11649 Type 2 diabetes mellitus with hypoglycemia without coma: Secondary | ICD-10-CM | POA: Insufficient documentation

## 2011-08-28 DIAGNOSIS — Z794 Long term (current) use of insulin: Secondary | ICD-10-CM | POA: Insufficient documentation

## 2011-08-28 DIAGNOSIS — E1151 Type 2 diabetes mellitus with diabetic peripheral angiopathy without gangrene: Secondary | ICD-10-CM | POA: Insufficient documentation

## 2011-08-28 MED ORDER — TRAZODONE HCL 100 MG PO TABS: 100.0000 mg | ORAL_TABLET | Freq: Every day | ORAL | Status: DC

## 2011-08-28 NOTE — Assessment & Plan Note (Signed)
Complicated by dementia and chronic pain. Continue Cymbalta and trazodone for insomnia.

## 2011-08-28 NOTE — Assessment & Plan Note (Addendum)
Her diabetes has been well-controlled with diet and metformin.  She is due for hemoglobin A1c. She has resumed smoking despite her history of acute MI last year. She is taking aspirin Plavix statin and ACE inhibitor. We'll monitor her diabetic eye exam has been given.  We'll also need to consider that her bilateral lower extremity pain may not be radiation from her back to maybe do to peripheral vascular disease. If her plain films do not show evidence of significant stenosis we will make an evaluation for her with vascular surgery to assess her circulation.

## 2011-08-28 NOTE — Telephone Encounter (Signed)
Ok to fill trazodone 30 with 6 refills

## 2011-08-28 NOTE — Assessment & Plan Note (Signed)
She has a prior history of lumbar back surgery with fusion. Plain films have been ordered to evaluate her current source of pain. There is severe intervertebral disc disease noted on prior films will consider MRI of lumbar spine for further evaluation. Continue judicious use of Vicodin as needed for pain.

## 2011-08-28 NOTE — Assessment & Plan Note (Signed)
She had been abstinent up until December secondary to suffering from a recent MI. She has resumed smoking. I again counseled her on the risks for recurrent stenosis and recurrent MI. She does not appear to be contemplative about quitting currently.

## 2011-08-28 NOTE — Telephone Encounter (Signed)
Faxed request from [facility], last filled 05/26/11.

## 2011-08-28 NOTE — Telephone Encounter (Signed)
Addended by: Duncan Dull on: 08/28/2011 08:19 AM   Modules accepted: Orders

## 2011-08-29 NOTE — Telephone Encounter (Signed)
Refill denied to pharmacy, pt no longer takes this.

## 2011-09-01 ENCOUNTER — Other Ambulatory Visit: Payer: Self-pay | Admitting: *Deleted

## 2011-09-01 MED ORDER — TRAZODONE HCL 100 MG PO TABS: 100.0000 mg | ORAL_TABLET | Freq: Every day | ORAL | Status: DC

## 2011-09-01 NOTE — Telephone Encounter (Signed)
Faxed request from [facility], last filled 07/30/11.

## 2011-09-15 ENCOUNTER — Encounter: Payer: Self-pay | Admitting: Cardiovascular Disease

## 2011-09-15 DIAGNOSIS — R0789 Other chest pain: Secondary | ICD-10-CM | POA: Diagnosis not present

## 2011-09-15 DIAGNOSIS — R52 Pain, unspecified: Secondary | ICD-10-CM | POA: Diagnosis not present

## 2011-09-15 DIAGNOSIS — R0602 Shortness of breath: Secondary | ICD-10-CM | POA: Diagnosis not present

## 2011-09-15 DIAGNOSIS — R6889 Other general symptoms and signs: Secondary | ICD-10-CM | POA: Diagnosis not present

## 2011-09-15 DIAGNOSIS — F17200 Nicotine dependence, unspecified, uncomplicated: Secondary | ICD-10-CM | POA: Diagnosis not present

## 2011-09-15 DIAGNOSIS — E119 Type 2 diabetes mellitus without complications: Secondary | ICD-10-CM | POA: Diagnosis not present

## 2011-09-15 DIAGNOSIS — R079 Chest pain, unspecified: Secondary | ICD-10-CM | POA: Diagnosis not present

## 2011-09-15 DIAGNOSIS — E785 Hyperlipidemia, unspecified: Secondary | ICD-10-CM | POA: Diagnosis not present

## 2011-09-15 DIAGNOSIS — I10 Essential (primary) hypertension: Secondary | ICD-10-CM | POA: Diagnosis not present

## 2011-09-15 LAB — CBC
HCT: 35.6 % (ref 35.0–47.0)
HGB: 12.1 g/dL (ref 12.0–16.0)
MCH: 32.1 pg (ref 26.0–34.0)
MCHC: 33.9 g/dL (ref 32.0–36.0)
MCV: 95 fL (ref 80–100)
Platelet: 263 10*3/uL (ref 150–440)
RBC: 3.76 10*6/uL — ABNORMAL LOW (ref 3.80–5.20)
RDW: 13.4 % (ref 11.5–14.5)
WBC: 10.1 10*3/uL (ref 3.6–11.0)

## 2011-09-15 LAB — BASIC METABOLIC PANEL
Anion Gap: 13 (ref 7–16)
BUN: 16 mg/dL (ref 7–18)
Calcium, Total: 8.7 mg/dL (ref 8.5–10.1)
Chloride: 107 mmol/L (ref 98–107)
Co2: 24 mmol/L (ref 21–32)
Creatinine: 1.2 mg/dL (ref 0.60–1.30)
EGFR (African American): 58 — ABNORMAL LOW
EGFR (Non-African Amer.): 48 — ABNORMAL LOW
Glucose: 152 mg/dL — ABNORMAL HIGH (ref 65–99)
Osmolality: 291 (ref 275–301)
Potassium: 4 mmol/L (ref 3.5–5.1)
Sodium: 144 mmol/L (ref 136–145)

## 2011-09-15 LAB — MAGNESIUM: Magnesium: 1.3 mg/dL — ABNORMAL LOW

## 2011-09-15 LAB — TROPONIN I: Troponin-I: 0.05 ng/mL

## 2011-09-15 LAB — PRO B NATRIURETIC PEPTIDE: B-Type Natriuretic Peptide: 225 pg/mL — ABNORMAL HIGH (ref 0–125)

## 2011-09-16 ENCOUNTER — Encounter: Payer: Self-pay | Admitting: Cardiovascular Disease

## 2011-09-16 ENCOUNTER — Telehealth: Payer: Self-pay | Admitting: Internal Medicine

## 2011-09-16 ENCOUNTER — Inpatient Hospital Stay: Payer: Self-pay | Admitting: Internal Medicine

## 2011-09-16 DIAGNOSIS — R748 Abnormal levels of other serum enzymes: Secondary | ICD-10-CM

## 2011-09-16 DIAGNOSIS — E785 Hyperlipidemia, unspecified: Secondary | ICD-10-CM | POA: Diagnosis not present

## 2011-09-16 DIAGNOSIS — I10 Essential (primary) hypertension: Secondary | ICD-10-CM | POA: Diagnosis not present

## 2011-09-16 DIAGNOSIS — I219 Acute myocardial infarction, unspecified: Secondary | ICD-10-CM | POA: Diagnosis not present

## 2011-09-16 DIAGNOSIS — R079 Chest pain, unspecified: Secondary | ICD-10-CM | POA: Diagnosis not present

## 2011-09-16 DIAGNOSIS — I2510 Atherosclerotic heart disease of native coronary artery without angina pectoris: Secondary | ICD-10-CM | POA: Diagnosis not present

## 2011-09-16 DIAGNOSIS — E119 Type 2 diabetes mellitus without complications: Secondary | ICD-10-CM | POA: Diagnosis not present

## 2011-09-16 LAB — COMPREHENSIVE METABOLIC PANEL
Albumin: 3.6 g/dL (ref 3.4–5.0)
Alkaline Phosphatase: 56 U/L (ref 50–136)
Anion Gap: 10 (ref 7–16)
BUN: 15 mg/dL (ref 7–18)
Bilirubin,Total: 0.2 mg/dL (ref 0.2–1.0)
Calcium, Total: 8.5 mg/dL (ref 8.5–10.1)
Chloride: 106 mmol/L (ref 98–107)
Co2: 26 mmol/L (ref 21–32)
Creatinine: 0.96 mg/dL (ref 0.60–1.30)
EGFR (African American): >60
EGFR (Non-African Amer.): >60
Glucose: 122 mg/dL — ABNORMAL HIGH (ref 65–99)
Osmolality: 285 (ref 275–301)
Potassium: 3.8 mmol/L (ref 3.5–5.1)
SGOT(AST): 25 U/L (ref 15–37)
SGPT (ALT): 19 U/L
Sodium: 142 mmol/L (ref 136–145)
Total Protein: 6.6 g/dL (ref 6.4–8.2)

## 2011-09-16 LAB — LIPID PANEL
Cholesterol: 250 mg/dL — ABNORMAL HIGH (ref 0–200)
HDL Cholesterol: 28 mg/dL — ABNORMAL LOW (ref 40–60)
Triglycerides: 549 mg/dL — ABNORMAL HIGH (ref 0–200)

## 2011-09-16 LAB — CBC WITH DIFFERENTIAL/PLATELET
Basophil #: 0 10*3/uL (ref 0.0–0.1)
Basophil %: 0.3 %
Eosinophil #: 0.1 10*3/uL (ref 0.0–0.7)
Eosinophil %: 1.5 %
HCT: 34.8 % — ABNORMAL LOW (ref 35.0–47.0)
HGB: 11.8 g/dL — ABNORMAL LOW (ref 12.0–16.0)
Lymphocyte #: 3 10*3/uL (ref 1.0–3.6)
Lymphocyte %: 37.9 %
MCH: 31.8 pg (ref 26.0–34.0)
MCHC: 33.9 g/dL (ref 32.0–36.0)
MCV: 94 fL (ref 80–100)
Monocyte #: 0.5 10*3/uL (ref 0.0–0.7)
Monocyte %: 6.5 %
Neutrophil #: 4.3 10*3/uL (ref 1.4–6.5)
Neutrophil %: 53.8 %
Platelet: 241 10*3/uL (ref 150–440)
RBC: 3.71 10*6/uL — ABNORMAL LOW (ref 3.80–5.20)
RDW: 13.7 % (ref 11.5–14.5)
WBC: 8 10*3/uL (ref 3.6–11.0)

## 2011-09-16 LAB — CK-MB
CK-MB: 2.7 ng/mL (ref 0.5–3.6)
CK-MB: 5.3 ng/mL — ABNORMAL HIGH (ref 0.5–3.6)

## 2011-09-16 LAB — CK TOTAL AND CKMB (NOT AT ARMC)
CK, Total: 183 U/L (ref 21–215)
CK-MB: 6.3 ng/mL — ABNORMAL HIGH (ref 0.5–3.6)

## 2011-09-16 LAB — TROPONIN I
Troponin-I: 1.13 ng/mL — ABNORMAL HIGH
Troponin-I: 0.99 ng/mL — ABNORMAL HIGH

## 2011-09-16 LAB — APTT: Activated PTT: 39.4 secs — ABNORMAL HIGH (ref 23.6–35.9)

## 2011-09-16 NOTE — Telephone Encounter (Signed)
Called to let you know that pt is in [facility] @ [facility]

## 2011-09-17 ENCOUNTER — Encounter: Payer: Self-pay | Admitting: Cardiovascular Disease

## 2011-09-17 DIAGNOSIS — I219 Acute myocardial infarction, unspecified: Secondary | ICD-10-CM | POA: Diagnosis not present

## 2011-09-17 DIAGNOSIS — I10 Essential (primary) hypertension: Secondary | ICD-10-CM | POA: Diagnosis not present

## 2011-09-17 DIAGNOSIS — I200 Unstable angina: Secondary | ICD-10-CM | POA: Diagnosis not present

## 2011-09-17 DIAGNOSIS — E785 Hyperlipidemia, unspecified: Secondary | ICD-10-CM | POA: Diagnosis not present

## 2011-09-17 DIAGNOSIS — E119 Type 2 diabetes mellitus without complications: Secondary | ICD-10-CM | POA: Diagnosis not present

## 2011-09-17 DIAGNOSIS — I2510 Atherosclerotic heart disease of native coronary artery without angina pectoris: Secondary | ICD-10-CM | POA: Diagnosis not present

## 2011-09-17 LAB — MAGNESIUM: Magnesium: 1.8 mg/dL

## 2011-09-17 LAB — APTT: Activated PTT: 57.7 secs — ABNORMAL HIGH (ref 23.6–35.9)

## 2011-09-18 ENCOUNTER — Encounter: Payer: Self-pay | Admitting: Cardiovascular Disease

## 2011-09-18 DIAGNOSIS — E119 Type 2 diabetes mellitus without complications: Secondary | ICD-10-CM | POA: Diagnosis not present

## 2011-09-18 DIAGNOSIS — R079 Chest pain, unspecified: Secondary | ICD-10-CM | POA: Diagnosis not present

## 2011-09-18 DIAGNOSIS — I10 Essential (primary) hypertension: Secondary | ICD-10-CM | POA: Diagnosis not present

## 2011-09-18 DIAGNOSIS — R748 Abnormal levels of other serum enzymes: Secondary | ICD-10-CM | POA: Diagnosis not present

## 2011-09-18 DIAGNOSIS — I219 Acute myocardial infarction, unspecified: Secondary | ICD-10-CM | POA: Diagnosis not present

## 2011-09-18 DIAGNOSIS — E785 Hyperlipidemia, unspecified: Secondary | ICD-10-CM | POA: Diagnosis not present

## 2011-09-18 LAB — BASIC METABOLIC PANEL
Anion Gap: 9 (ref 7–16)
BUN: 10 mg/dL (ref 7–18)
Calcium, Total: 8.5 mg/dL (ref 8.5–10.1)
Chloride: 105 mmol/L (ref 98–107)
Co2: 27 mmol/L (ref 21–32)
Creatinine: 0.93 mg/dL (ref 0.60–1.30)
EGFR (African American): >60
EGFR (Non-African Amer.): >60
Glucose: 114 mg/dL — ABNORMAL HIGH (ref 65–99)
Osmolality: 281 (ref 275–301)
Potassium: 4 mmol/L (ref 3.5–5.1)
Sodium: 141 mmol/L (ref 136–145)

## 2011-09-18 LAB — PLATELET COUNT: Platelet: 211 10*3/uL (ref 150–440)

## 2011-09-18 LAB — CK TOTAL AND CKMB (NOT AT ARMC)
CK, Total: 96 U/L (ref 21–215)
CK-MB: 5.7 ng/mL — ABNORMAL HIGH (ref 0.5–3.6)

## 2011-09-18 LAB — HEMOGLOBIN: HGB: 10.6 g/dL — ABNORMAL LOW (ref 12.0–16.0)

## 2011-09-23 ENCOUNTER — Encounter: Payer: Self-pay | Admitting: Nurse Practitioner

## 2011-09-23 ENCOUNTER — Ambulatory Visit (INDEPENDENT_AMBULATORY_CARE_PROVIDER_SITE_OTHER): Payer: Medicare Other | Admitting: Nurse Practitioner

## 2011-09-23 VITALS — BP 141/98 | HR 70 | Ht 67.0 in | Wt 145.8 lb

## 2011-09-23 DIAGNOSIS — I214 Non-ST elevation (NSTEMI) myocardial infarction: Secondary | ICD-10-CM | POA: Diagnosis not present

## 2011-09-23 DIAGNOSIS — Z720 Tobacco use: Secondary | ICD-10-CM

## 2011-09-23 DIAGNOSIS — E1151 Type 2 diabetes mellitus with diabetic peripheral angiopathy without gangrene: Secondary | ICD-10-CM

## 2011-09-23 DIAGNOSIS — I2510 Atherosclerotic heart disease of native coronary artery without angina pectoris: Secondary | ICD-10-CM

## 2011-09-23 DIAGNOSIS — E785 Hyperlipidemia, unspecified: Secondary | ICD-10-CM

## 2011-09-23 DIAGNOSIS — F17200 Nicotine dependence, unspecified, uncomplicated: Secondary | ICD-10-CM | POA: Diagnosis not present

## 2011-09-23 DIAGNOSIS — R079 Chest pain, unspecified: Secondary | ICD-10-CM | POA: Diagnosis not present

## 2011-09-23 DIAGNOSIS — E1159 Type 2 diabetes mellitus with other circulatory complications: Secondary | ICD-10-CM

## 2011-09-23 NOTE — Patient Instructions (Signed)
Your physician has recommended you make the following change in your medication: START 09/23/11 COREG 3.125 MG TWICE DAILY; START 09/24/11 PLAVIX 75 MG 1 TAB DAILY  Your physician recommends that you schedule a follow-up appointment in: 1 MONTH WITH DR. Mariah Milling

## 2011-09-23 NOTE — Progress Notes (Signed)
Patient Name: Sarah Payne Date of Encounter: 09/23/2011  Primary Care Provider:  Duncan Dull, MD, MD Primary Cardiologist:  Concha Se, MD  Patient Profile  66 year old female with prior history of CAD status post recent non-ST elevation MI who presents for followup.  Problem List   Past Medical History  Diagnosis Date  . Diabetes mellitus   . Hypertension   . Hypothyroidism   . Depression   . Vascular dementia   . Coronary artery disease     a. s/p PCI/DES LCX x 3;  b. NSTEMI 09/16/11;  c.   09/17/11 Cath [facility]) Severe RCA/LCX dzs - RCA treated, LCX intervention pending w/ Dr. Juliann Pares  . Tobacco abuse     a. 45 pack year history - quit 09/16/11  . Chronic low back pain    Past Surgical History  Procedure Date  . Back surgery   . Abdominal hysterectomy   . Appendectomy   . Cardiac catheterization   . Coronary angioplasty 2012    stent x 3     Allergies  No Known Allergies  HPI  66 year old female with the above problem list.  She has a prior history of CAD status post multiple left circumflex interventions in the setting of recurrent in-stent restenosis.  Most recently, patient was hospitalized a week ago at [facility] secondary to recurrent chest pain and elevated cardiac markers.  Catheterization revealed new right coronary artery disease as well as circumflex stenosis.  The right coronary artery was treated and she's currently pending outpatient percutaneous intervention to the circumflex.  Since her discharge less than a week ago, patient reports that she never went to her local pharmacy to pick up her Plavix or Coreg prescriptions.  She has been taking a baby aspirin.  Yesterday, patient felt exhausted for most of the day and noted very mild chest discomfort that lasted for several hours and resolved spontaneously.  She did not take any nitroglycerin as overall she didn't think too much of the discomfort.  She is pain-free this morning and feels that her fatigue  is slightly improved.  She hasn't wearing a nicotine patch and says she's only smoked one cigarette since her discharge.  Unfortunately this was done with the patch in place.  She says she no longer has cigarettes in her home.  Home Medications  Prior to Admission medications   Medication Sig Start Date End Date Taking? Authorizing Provider  aspirin 81 MG tablet Take 81 mg by mouth daily.     Yes Historical Provider, MD  Calcium & Magnesium Carbonates (MYLANTA PO) Take by mouth.     Yes Historical Provider, MD  cyanocobalamin (,VITAMIN B-12,) 1000 MCG/ML injection Inject 1,000 mcg into the muscle once.   Yes Historical Provider, MD  DULoxetine (CYMBALTA) 60 MG capsule Take 1 capsule (60 mg total) by mouth daily. 06/24/11 06/23/12 Yes Duncan Dull, MD  enalapril (VASOTEC) 10 MG tablet Take 10 mg by mouth daily.   Yes Historical Provider, MD  HYDROcodone-acetaminophen (VICODIN) 5-500 MG per tablet Take 1 tablet by mouth 4 (four) times daily as needed for pain. 07/10/11 07/09/12 Yes Duncan Dull, MD  levothyroxine (SYNTHROID, LEVOTHROID) 150 MCG tablet TAKE 1 TABLET (150 MCG TOTAL) BY MOUTH DAILY. 07/25/11  Yes Duncan Dull, MD  metFORMIN (GLUMETZA) 1000 MG (MOD) 24 hr tablet Take 1,000 mg by mouth 2 (two) times daily with a meal.     Yes Historical Provider, MD  nicotine (NICODERM CQ - DOSED IN MG/24 HOURS) 14 mg/24hr  patch Place 1 patch onto the skin daily.   Yes Historical Provider, MD  nitroGLYCERIN (NITROSTAT) 0.4 MG SL tablet Place 0.4 mg under the tongue every 5 (five) minutes as needed.     Yes Historical Provider, MD  pravastatin (PRAVACHOL) 40 MG tablet Take 1 tablet (40 mg total) by mouth every evening. 05/03/11 05/02/12 Yes Duncan Dull, MD  traZODone (DESYREL) 100 MG tablet Take 1 tablet (100 mg total) by mouth at bedtime. 09/01/11  Yes Duncan Dull, MD  carvedilol (COREG) 3.125 MG tablet Take 1 tablet (3.125 mg total) by mouth 2 (two) times daily. 06/13/11 06/12/12  Duncan Dull, MD    clopidogrel (PLAVIX) 75 MG tablet Take 1 tablet (75 mg total) by mouth daily. 06/27/11   Duncan Dull, MD  magnesium oxide (MAG-OX) 400 MG tablet Take 400 mg by mouth daily.      Historical Provider, MD    Family History  Family History  Problem Relation Age of Onset  . Heart attack Mother     First MI @ 34 - Died @ 54  . Heart disease Mother   . Heart disease Father     Died @ 36  . Throat cancer Brother     Social History  History   Social History  . Marital Status: Married    Spouse Name: N/A    Number of Children: N/A  . Years of Education: N/A   Occupational History  . Not on file.   Social History Main Topics  . Smoking status: Former Smoker -- 1.0 packs/day for 45 years    Types: Cigarettes    Quit date: 09/16/2011  . Smokeless tobacco: Never Used   Comment: Has cut back, trying to quit.  . Alcohol Use: No  . Drug Use: No  . Sexually Active: Not on file   Other Topics Concern  . Not on file   Social History Narrative   Lives at home with her husband in [place].  Previously used marijuana - quit.     Review of Systems General:  No chills, fever, night sweats or weight changes.  Cardiovascular:  Mild chest pain yesterday.  No dyspnea on exertion, edema, orthopnea, palpitations, paroxysmal nocturnal dyspnea.  Easy fatigability. Dermatological: No rash, lesions/masses Respiratory: No cough, dyspnea Urologic: No hematuria, dysuria Abdominal:   No nausea, vomiting, diarrhea, bright red blood per rectum, melena, or hematemesis Neurologic:  No visual changes, wkns, changes in mental status. All other systems reviewed and are otherwise negative except as noted above.  Physical Exam  Blood pressure 141/98, pulse 70, height 5\' 7"  (1.702 m), weight 145 lb 12.8 oz (66.134 kg).  General: Pleasant, NAD Psych: Flat affect. Neuro: Alert and oriented X 3. Moves all extremities spontaneously. HEENT: Normal  Neck: Supple without bruits or JVD. Lungs:  Resp  regular and unlabored, CTA. Heart: RRR no s3, s4, or murmurs. Abdomen: Soft, non-tender, non-distended, BS + x 4.  Extremities: No clubbing, cyanosis or edema. DP/PT/Radials 2+ and equal bilaterally.  Right groin site w/o bleeding/bruit/hematoma.  Accessory Clinical Findings  ECG - RSR, 70, no acute st/t changes  Assessment & Plan  1.  CAD s/p NSTEMI:  Pt is s/p recent pci/stenting of RCA with known residual LCX dzs.  Unfortunately, pt did not retrieve her plavix or coreg from the pharmacy following her d/c from the hospital last week.  Yesterday, she had c/p and felt exhausted for much of the day.  C/P resolved last night spontaneously.  Her ECG today is  non-acute and she has had no further chest pain.  Fatigue has improved some.  Discussed case with Dr. Mariah Milling.  In the absence of adequate platelet inhibition since last Thursday, we felt that pt required reloading of p2y12 inhibitor.  She was therefor administered Effient 60mg  PO x 1 here in the office today.  She is going to her pharmacy to pick up her plavix, which she will start tomorrow morning.  She will start coreg tonight and cont her home dose of pravachol.  We've been in touch with Dr. Glennis Brink office and she is scheduled for PCI of the LCX on 3/14 @ 12:30pm.  I advised her that if she has any recurrent, nitrate unresponsive chest pain, that she should present to the [facility] ED for eval.  2.  HTN:  BP up today.  She's going to start coreg tonight.  3.  HL:  Cont pravachol.  4.  Tobacco abuse:  Since d/c, she has been wearing a nicotine patch.  She says she has smoked 1 cigarette (with patch on) since d/c.  I encouraged her to cease all cigarette usage and advised that if she plans on smoking, she should remove the nicotine patch.  5.  DM:  On metformin.  This will need to be held prior to her cath/pci.    Nicolasa Ducking, NP 09/23/2011, 4:37 PM

## 2011-09-24 ENCOUNTER — Other Ambulatory Visit: Payer: Self-pay | Admitting: Internal Medicine

## 2011-09-24 DIAGNOSIS — M545 Low back pain, unspecified: Secondary | ICD-10-CM

## 2011-09-24 MED ORDER — HYDROCODONE-ACETAMINOPHEN 5-500 MG PO TABS: 1.0000 | ORAL_TABLET | Freq: Four times a day (QID) | ORAL | Status: DC | PRN

## 2011-09-25 ENCOUNTER — Ambulatory Visit (INDEPENDENT_AMBULATORY_CARE_PROVIDER_SITE_OTHER): Payer: Medicare Other | Admitting: Internal Medicine

## 2011-09-25 ENCOUNTER — Other Ambulatory Visit: Payer: Self-pay | Admitting: Internal Medicine

## 2011-09-25 ENCOUNTER — Encounter: Payer: Self-pay | Admitting: Internal Medicine

## 2011-09-25 ENCOUNTER — Telehealth: Payer: Self-pay | Admitting: *Deleted

## 2011-09-25 DIAGNOSIS — I798 Other disorders of arteries, arterioles and capillaries in diseases classified elsewhere: Secondary | ICD-10-CM

## 2011-09-25 DIAGNOSIS — I208 Other forms of angina pectoris: Secondary | ICD-10-CM

## 2011-09-25 DIAGNOSIS — I209 Angina pectoris, unspecified: Secondary | ICD-10-CM

## 2011-09-25 DIAGNOSIS — F17200 Nicotine dependence, unspecified, uncomplicated: Secondary | ICD-10-CM

## 2011-09-25 DIAGNOSIS — E1159 Type 2 diabetes mellitus with other circulatory complications: Secondary | ICD-10-CM

## 2011-09-25 DIAGNOSIS — E1151 Type 2 diabetes mellitus with diabetic peripheral angiopathy without gangrene: Secondary | ICD-10-CM

## 2011-09-25 DIAGNOSIS — R079 Chest pain, unspecified: Secondary | ICD-10-CM

## 2011-09-25 DIAGNOSIS — I2510 Atherosclerotic heart disease of native coronary artery without angina pectoris: Secondary | ICD-10-CM

## 2011-09-25 DIAGNOSIS — I739 Peripheral vascular disease, unspecified: Secondary | ICD-10-CM

## 2011-09-25 DIAGNOSIS — I2089 Other forms of angina pectoris: Secondary | ICD-10-CM | POA: Insufficient documentation

## 2011-09-25 DIAGNOSIS — Z720 Tobacco use: Secondary | ICD-10-CM

## 2011-09-25 LAB — CK TOTAL AND CKMB (NOT AT ARMC)
CK, Total: 95 U/L (ref 21–215)
CK-MB: 1.5 ng/mL (ref 0.5–3.6)

## 2011-09-25 LAB — PRO B NATRIURETIC PEPTIDE: B-Type Natriuretic Peptide: 182 pg/mL — ABNORMAL HIGH (ref 0–125)

## 2011-09-25 LAB — TROPONIN I: Troponin-I: 0.06 ng/mL — ABNORMAL HIGH

## 2011-09-25 MED ORDER — ISOSORBIDE MONONITRATE ER 30 MG PO TB24: 30.0000 mg | ORAL_TABLET | Freq: Every day | ORAL | Status: DC

## 2011-09-25 NOTE — Telephone Encounter (Signed)
[facility] called with critical troponin on patient- 0.06, verbally advised DR. TUllo and routed phone note to her.

## 2011-09-25 NOTE — Assessment & Plan Note (Addendum)
She has not been using her NTG.  She was not taking Plavix until Tuesday when Dr. Mariah Milling reviewed her D/C meds and this one was missing although prescribed at discharge.  Her EKG was repeated today . There is a slight change from March 5 with TWI in anterior leads. Cardiac enzymes done at [facility] today were CK 95,  CKMB 1.5 and Trop I was  0.06.  I have discussed her presentation, EKG and troponin levels with Dr. Mariah Milling and reviewed her recent hospitalization. She is supposed to followup with Dr. Juliann Pares in two weeks for PTCA/stent of the mid circumflerx in-stent restenosis that was seen on her 2/27 cath.  I am going to start some Imdur today, which I have [facility] to [facility] on [address].  I have also advised husband to take patient to the [facility] ER if she has any escalation of her chest pain, so that the [facility] cardiologists in [place] will admit her and do her catheterization there.   A total of 60 minutes was spent on patient today, over half of which was coordinating patient care.

## 2011-09-25 NOTE — Progress Notes (Signed)
Subjective:    Patient ID: Sarah Payne, female    DOB: September 18, 1945, 66 y.o.   MRN: [id number]  HPI   Cc : chest pain .   66 yr old white female with history of CAD prior NSTEMI July 2012 with  PTCA/stent to mid circumflex x 3, admitted on Feb 26 with chest pain secondary to NSTEMI presents for hospital followup.  During hospitalization recurrent stenosis of mid curcflex was founf but not stented, and 95% occluded RCA was stented sucessfully. nd, stent placed, awaiting followup cath /stent to be done by [facility].   Discharged one week ago. Monday, 4 days ago had chest pain all day,  Did not take a nitroglycerin,  Saw Dr. Mariah Milling on Tuesday but pain was not present so she was not readmitted .  Pain recurred on Wednesday and is present today . Describes it as heaviness in chest, mild pain.Marland Kitchen accompanied by shortness of breath even at rest.  No nausea,  Appetite good, Pain is reminiscent but less severe than on the day of recent admission .    Past Medical History  Diagnosis Date  . Diabetes mellitus   . Hypertension   . Hypothyroidism   . Depression   . Vascular dementia   . Coronary artery disease     a. s/p PCI/DES LCX x 3;  b. NSTEMI 09/16/11;  c.   09/17/11 Cath [facility]) Severe RCA/LCX dzs - RCA treated, LCX intervention pending w/ Dr. Juliann Pares  . Tobacco abuse     a. 45 pack year history - quit 09/16/11  . Chronic low back pain    Current Outpatient Prescriptions on File Prior to Visit  Medication Sig Dispense Refill  . aspirin 81 MG tablet Take 81 mg by mouth daily.        . Calcium & Magnesium Carbonates (MYLANTA PO) Take by mouth.        . carvedilol (COREG) 3.125 MG tablet Take 1 tablet (3.125 mg total) by mouth 2 (two) times daily.  60 tablet  3  . clopidogrel (PLAVIX) 75 MG tablet Take 1 tablet (75 mg total) by mouth daily.  30 tablet  3  . cyanocobalamin (,VITAMIN B-12,) 1000 MCG/ML injection Inject 1,000 mcg into the muscle once.      . DULoxetine (CYMBALTA) 60 MG capsule Take 1  capsule (60 mg total) by mouth daily.  30 capsule  2  . enalapril (VASOTEC) 10 MG tablet Take 10 mg by mouth daily.      Marland Kitchen HYDROcodone-acetaminophen (VICODIN) 5-500 MG per tablet Take 1 tablet by mouth 4 (four) times daily as needed for pain.  120 tablet  1  . levothyroxine (SYNTHROID, LEVOTHROID) 150 MCG tablet TAKE 1 TABLET (150 MCG TOTAL) BY MOUTH DAILY.  30 tablet  3  . magnesium oxide (MAG-OX) 400 MG tablet Take 400 mg by mouth daily.        . metFORMIN (GLUMETZA) 1000 MG (MOD) 24 hr tablet Take 1,000 mg by mouth 2 (two) times daily with a meal.        . nicotine (NICODERM CQ - DOSED IN MG/24 HOURS) 14 mg/24hr patch Place 1 patch onto the skin daily.      . nitroGLYCERIN (NITROSTAT) 0.4 MG SL tablet Place 0.4 mg under the tongue every 5 (five) minutes as needed.        . pravastatin (PRAVACHOL) 40 MG tablet Take 1 tablet (40 mg total) by mouth every evening.  30 tablet  11  . traZODone (  DESYREL) 100 MG tablet Take 1 tablet (100 mg total) by mouth at bedtime.  30 tablet  3       Review of Systems  Constitutional: Positive for fatigue. Negative for fever, chills and unexpected weight change.  HENT: Negative for hearing loss, ear pain, nosebleeds, congestion, sore throat, facial swelling, rhinorrhea, sneezing, mouth sores, trouble swallowing, neck pain, neck stiffness, voice change, postnasal drip, sinus pressure, tinnitus and ear discharge.   Eyes: Negative for pain, discharge, redness and visual disturbance.  Respiratory: Positive for shortness of breath. Negative for cough, chest tightness, wheezing and stridor.   Cardiovascular: Positive for chest pain. Negative for palpitations and leg swelling.  Musculoskeletal: Negative for myalgias and arthralgias.  Skin: Negative for color change and rash.  Neurological: Negative for dizziness, weakness, light-headedness and headaches.  Hematological: Negative for adenopathy.  Psychiatric/Behavioral: Positive for confusion and dysphoric mood.     BP 126/72  Pulse 90  Temp(Src) 97.9 F (36.6 C) (Oral)  Resp 16  Ht 5\' 7"  (1.702 m)  Wt 143 lb 8 oz (65.091 kg)  BMI 22.48 kg/m2  SpO2 99%     Objective:   Physical Exam  Constitutional: She is oriented to person, place, and time. She appears well-developed and well-nourished.  HENT:  Mouth/Throat: Oropharynx is clear and moist.  Eyes: EOM are normal. Pupils are equal, round, and reactive to light. No scleral icterus.  Neck: Normal range of motion. Neck supple. No JVD present. No thyromegaly present.  Cardiovascular: Normal rate, regular rhythm, normal heart sounds and intact distal pulses.   Pulmonary/Chest: Effort normal and breath sounds normal.  Abdominal: Soft. Bowel sounds are normal. She exhibits no mass. There is no tenderness.  Musculoskeletal: Normal range of motion. She exhibits no edema.  Lymphadenopathy:    She has no cervical adenopathy.  Neurological: She is alert and oriented to person, place, and time.  Skin: Skin is warm and dry.  Psychiatric: Her speech is normal and behavior is normal. Judgment and thought content normal. Cognition and memory are impaired. She exhibits a depressed mood.      Assessment & Plan:   Angina at rest She has not been using her NTG.  She was not taking Plavix until Tuesday when Dr. Mariah Milling reviewed her D/C meds and this one was missing although prescribed at discharge.  Her EKG was repeated today . There is a slight change from March 5 with TWI in anterior leads. Cardiac enzymes done at [facility] today were CK 95,  CKMB 1.5 and Trop I was  0.06.  I have discussed her presentation, EKG and troponin levels with Dr. Mariah Milling and reviewed her recent hospitalization. She is supposed to followup with Dr. Juliann Pares in two weeks for PTCA/stent of the mid circumflerx in-stent restenosis that was seen on her 2/27 cath.  I am going to start some Imdur today, [facility] on [address].  I have also advised husband to take patient to  the [facility] ER if she has any escalation of her chest pain, so that the [facility] cardiologists in [place] will admit her and do her catheterization there.   A total of 60 minutes was spent on patient today, over half of which was coordinating patient care.    CAD, multiple vessel With NSTEMI July 2012 s/p 3 stents to circumflex, followed by NSTEMI Sep 14 2010 s/p stent to RCA on 09/16/10 and plans to restent the mid circumflex by [facility], for unclear reasons, in two  weeks.  Patient has diabetes, vascular dementia, and had resumed tobacco use several months ago. Discharged home on 2/28 on Plavix , ASA. Coreg, lipitor, and vasotec but did not fill the rx for plavix and coreg until Tuesday's cardiology followup.  Was loaded with Effient  in office on March 5 and resumed Plavix on march 6   Diabetes mellitus type 2 with peripheral artery disease Continue metformin and glipizide. She is overdue for hgba1c .  We will have her return next week for recheck and labs.   Tobacco abuse Prior cessation after first MI , then resumed  And had another MI. Now wearing nicotine patch, has had one ciagarette since dc per Cardiology note.  Patient and husband were told in no uncertain terms today  that her continued tobacco use has contributed  significantly to her repeat MI and restenosis of prior stents.     Updated Medication List Outpatient Encounter Prescriptions as of 09/25/2011  Medication Sig Dispense Refill  . aspirin 81 MG tablet Take 81 mg by mouth daily.        . Calcium & Magnesium Carbonates (MYLANTA PO) Take by mouth.        . carvedilol (COREG) 3.125 MG tablet Take 1 tablet (3.125 mg total) by mouth 2 (two) times daily.  60 tablet  3  . clopidogrel (PLAVIX) 75 MG tablet Take 1 tablet (75 mg total) by mouth daily.  30 tablet  3  . cyanocobalamin (,VITAMIN B-12,) 1000 MCG/ML injection Inject 1,000 mcg into the muscle once.      . DULoxetine (CYMBALTA) 60 MG capsule Take 1 capsule (60 mg total)  by mouth daily.  30 capsule  2  . enalapril (VASOTEC) 10 MG tablet Take 10 mg by mouth daily.      Marland Kitchen HYDROcodone-acetaminophen (VICODIN) 5-500 MG per tablet Take 1 tablet by mouth 4 (four) times daily as needed for pain.  120 tablet  1  . levothyroxine (SYNTHROID, LEVOTHROID) 150 MCG tablet TAKE 1 TABLET (150 MCG TOTAL) BY MOUTH DAILY.  30 tablet  3  . magnesium oxide (MAG-OX) 400 MG tablet Take 400 mg by mouth daily.        . metFORMIN (GLUMETZA) 1000 MG (MOD) 24 hr tablet Take 1,000 mg by mouth 2 (two) times daily with a meal.        . nicotine (NICODERM CQ - DOSED IN MG/24 HOURS) 14 mg/24hr patch Place 1 patch onto the skin daily.      . nitroGLYCERIN (NITROSTAT) 0.4 MG SL tablet Place 0.4 mg under the tongue every 5 (five) minutes as needed.        . pravastatin (PRAVACHOL) 40 MG tablet Take 1 tablet (40 mg total) by mouth every evening.  30 tablet  11  . isosorbide mononitrate (IMDUR) 30 MG 24 hr tablet Take 1 tablet (30 mg total) by mouth daily.  30 tablet  6  . traZODone (DESYREL) 100 MG tablet Take 1 tablet (100 mg total) by mouth at bedtime.  30 tablet  3

## 2011-09-25 NOTE — Assessment & Plan Note (Addendum)
Continue metformin and glipizide. She is overdue for hgba1c .  We will have her return next week for recheck and labs.

## 2011-09-25 NOTE — Assessment & Plan Note (Addendum)
With NSTEMI July 2012 s/p 3 stents to circumflex, followed by NSTEMI Sep 14 2010 s/p stent to RCA on 09/16/10 and plans to restent the mid circumflex by [facility], for unclear reasons, in two weeks.  Patient has diabetes, vascular dementia, and had resumed tobacco use several months ago. Discharged home on 2/28 on Plavix , ASA. Coreg, lipitor, and vasotec but did not fill the rx for plavix and coreg until Tuesday's cardiology followup.  Was loaded with Effient  in office on March 5 and resumed Plavix on march 6

## 2011-09-25 NOTE — Telephone Encounter (Signed)
I have discussed her presentation, EKG and troponin levels with Dr. Mariah Milling and reviewed her recent hospitalization. She is not having an MIR.  I have discussed this with her and her husband,  She does not need to be admitted to thr hospital. She is going to start a new medication, Imdur, which [facility] [address].  I have also advised husband to take patient to the [facility] ER if she has any escalation of her chest pain, so that the [facility] cardiologists in [place] will admit her and do her cathterization there.

## 2011-09-25 NOTE — Assessment & Plan Note (Signed)
Prior cessation after first MI , then resumed  And had another MI. Now wearing nicotine patch, has had one ciagarette since dc per Cardiology note.  Patient and husband were told in no uncertain terms today  that her continued tobacco use has contributed  significantly to her repeat MI and restenosis of prior stents.

## 2011-09-29 ENCOUNTER — Other Ambulatory Visit: Payer: Self-pay | Admitting: Internal Medicine

## 2011-09-29 MED ORDER — TRAZODONE HCL 100 MG PO TABS: 100.0000 mg | ORAL_TABLET | Freq: Every day | ORAL | Status: DC

## 2011-10-01 ENCOUNTER — Encounter: Payer: Self-pay | Admitting: Internal Medicine

## 2011-10-02 ENCOUNTER — Ambulatory Visit: Payer: Medicare Other | Admitting: Internal Medicine

## 2011-10-02 ENCOUNTER — Ambulatory Visit: Payer: Self-pay | Admitting: Internal Medicine

## 2011-10-02 DIAGNOSIS — Z9889 Other specified postprocedural states: Secondary | ICD-10-CM | POA: Diagnosis not present

## 2011-10-02 DIAGNOSIS — G8929 Other chronic pain: Secondary | ICD-10-CM | POA: Diagnosis not present

## 2011-10-02 DIAGNOSIS — Z823 Family history of stroke: Secondary | ICD-10-CM | POA: Diagnosis not present

## 2011-10-02 DIAGNOSIS — I10 Essential (primary) hypertension: Secondary | ICD-10-CM | POA: Diagnosis not present

## 2011-10-02 DIAGNOSIS — I672 Cerebral atherosclerosis: Secondary | ICD-10-CM | POA: Diagnosis not present

## 2011-10-02 DIAGNOSIS — F0150 Vascular dementia without behavioral disturbance: Secondary | ICD-10-CM | POA: Diagnosis not present

## 2011-10-02 DIAGNOSIS — Z7982 Long term (current) use of aspirin: Secondary | ICD-10-CM | POA: Diagnosis not present

## 2011-10-02 DIAGNOSIS — E039 Hypothyroidism, unspecified: Secondary | ICD-10-CM | POA: Diagnosis not present

## 2011-10-02 DIAGNOSIS — Z9861 Coronary angioplasty status: Secondary | ICD-10-CM | POA: Diagnosis not present

## 2011-10-02 DIAGNOSIS — E785 Hyperlipidemia, unspecified: Secondary | ICD-10-CM | POA: Diagnosis not present

## 2011-10-02 DIAGNOSIS — Z90710 Acquired absence of both cervix and uterus: Secondary | ICD-10-CM | POA: Diagnosis not present

## 2011-10-02 DIAGNOSIS — I252 Old myocardial infarction: Secondary | ICD-10-CM | POA: Diagnosis not present

## 2011-10-02 DIAGNOSIS — I2510 Atherosclerotic heart disease of native coronary artery without angina pectoris: Secondary | ICD-10-CM | POA: Diagnosis not present

## 2011-10-02 DIAGNOSIS — Z808 Family history of malignant neoplasm of other organs or systems: Secondary | ICD-10-CM | POA: Diagnosis not present

## 2011-10-02 DIAGNOSIS — Z8249 Family history of ischemic heart disease and other diseases of the circulatory system: Secondary | ICD-10-CM | POA: Diagnosis not present

## 2011-10-02 DIAGNOSIS — Z79899 Other long term (current) drug therapy: Secondary | ICD-10-CM | POA: Diagnosis not present

## 2011-10-02 DIAGNOSIS — F329 Major depressive disorder, single episode, unspecified: Secondary | ICD-10-CM | POA: Diagnosis not present

## 2011-10-02 DIAGNOSIS — K219 Gastro-esophageal reflux disease without esophagitis: Secondary | ICD-10-CM | POA: Diagnosis not present

## 2011-10-02 DIAGNOSIS — I200 Unstable angina: Secondary | ICD-10-CM | POA: Diagnosis not present

## 2011-10-02 DIAGNOSIS — K319 Disease of stomach and duodenum, unspecified: Secondary | ICD-10-CM | POA: Diagnosis not present

## 2011-10-02 DIAGNOSIS — M549 Dorsalgia, unspecified: Secondary | ICD-10-CM | POA: Diagnosis not present

## 2011-10-02 DIAGNOSIS — Z0289 Encounter for other administrative examinations: Secondary | ICD-10-CM

## 2011-10-02 DIAGNOSIS — Z7902 Long term (current) use of antithrombotics/antiplatelets: Secondary | ICD-10-CM | POA: Diagnosis not present

## 2011-10-02 DIAGNOSIS — E1169 Type 2 diabetes mellitus with other specified complication: Secondary | ICD-10-CM | POA: Diagnosis not present

## 2011-10-02 DIAGNOSIS — T82897A Other specified complication of cardiac prosthetic devices, implants and grafts, initial encounter: Secondary | ICD-10-CM | POA: Diagnosis not present

## 2011-10-02 LAB — CBC WITH DIFFERENTIAL/PLATELET
Basophil #: 0 10*3/uL (ref 0.0–0.1)
Basophil %: 0.3 %
Eosinophil #: 0.1 10*3/uL (ref 0.0–0.7)
Eosinophil %: 1.4 %
HCT: 34 % — ABNORMAL LOW (ref 35.0–47.0)
HGB: 11.6 g/dL — ABNORMAL LOW (ref 12.0–16.0)
Lymphocyte #: 2 10*3/uL (ref 1.0–3.6)
Lymphocyte %: 18.7 %
MCH: 32.1 pg (ref 26.0–34.0)
MCHC: 34.1 g/dL (ref 32.0–36.0)
MCV: 94 fL (ref 80–100)
Monocyte #: 0.7 10*3/uL (ref 0.0–0.7)
Monocyte %: 6.4 %
Neutrophil #: 7.7 10*3/uL — ABNORMAL HIGH (ref 1.4–6.5)
Neutrophil %: 73.2 %
Platelet: 310 10*3/uL (ref 150–440)
RBC: 3.6 10*6/uL — ABNORMAL LOW (ref 3.80–5.20)
RDW: 13.4 % (ref 11.5–14.5)
WBC: 10.5 10*3/uL (ref 3.6–11.0)

## 2011-10-02 LAB — BASIC METABOLIC PANEL
Anion Gap: 11 (ref 7–16)
BUN: 24 mg/dL — ABNORMAL HIGH (ref 7–18)
Calcium, Total: 9.2 mg/dL (ref 8.5–10.1)
Chloride: 104 mmol/L (ref 98–107)
Co2: 25 mmol/L (ref 21–32)
Creatinine: 1.24 mg/dL (ref 0.60–1.30)
EGFR (African American): 56 — ABNORMAL LOW
EGFR (Non-African Amer.): 46 — ABNORMAL LOW
Glucose: 149 mg/dL — ABNORMAL HIGH (ref 65–99)
Osmolality: 286 (ref 275–301)
Potassium: 4.3 mmol/L (ref 3.5–5.1)
Sodium: 140 mmol/L (ref 136–145)

## 2011-10-03 DIAGNOSIS — R079 Chest pain, unspecified: Secondary | ICD-10-CM

## 2011-10-03 DIAGNOSIS — Z9861 Coronary angioplasty status: Secondary | ICD-10-CM | POA: Diagnosis not present

## 2011-10-03 DIAGNOSIS — I10 Essential (primary) hypertension: Secondary | ICD-10-CM | POA: Diagnosis not present

## 2011-10-03 DIAGNOSIS — R748 Abnormal levels of other serum enzymes: Secondary | ICD-10-CM | POA: Diagnosis not present

## 2011-10-03 DIAGNOSIS — I252 Old myocardial infarction: Secondary | ICD-10-CM | POA: Diagnosis not present

## 2011-10-03 DIAGNOSIS — T82897A Other specified complication of cardiac prosthetic devices, implants and grafts, initial encounter: Secondary | ICD-10-CM | POA: Diagnosis not present

## 2011-10-03 DIAGNOSIS — I2510 Atherosclerotic heart disease of native coronary artery without angina pectoris: Secondary | ICD-10-CM | POA: Diagnosis not present

## 2011-10-03 DIAGNOSIS — Z7902 Long term (current) use of antithrombotics/antiplatelets: Secondary | ICD-10-CM | POA: Diagnosis not present

## 2011-10-03 LAB — BASIC METABOLIC PANEL
Anion Gap: 8 (ref 7–16)
BUN: 19 mg/dL — ABNORMAL HIGH (ref 7–18)
Calcium, Total: 8.5 mg/dL (ref 8.5–10.1)
Chloride: 108 mmol/L — ABNORMAL HIGH (ref 98–107)
Co2: 25 mmol/L (ref 21–32)
Creatinine: 0.95 mg/dL (ref 0.60–1.30)
EGFR (African American): >60
EGFR (Non-African Amer.): >60
Glucose: 130 mg/dL — ABNORMAL HIGH (ref 65–99)
Osmolality: 285 (ref 275–301)
Potassium: 4.1 mmol/L (ref 3.5–5.1)
Sodium: 141 mmol/L (ref 136–145)

## 2011-10-03 LAB — CK TOTAL AND CKMB (NOT AT ARMC)
CK, Total: 64 U/L (ref 21–215)
CK-MB: 4 ng/mL — ABNORMAL HIGH (ref 0.5–3.6)

## 2011-10-07 ENCOUNTER — Other Ambulatory Visit: Payer: Self-pay | Admitting: *Deleted

## 2011-10-08 MED ORDER — DULOXETINE HCL 60 MG PO CPEP: 60.0000 mg | ORAL_CAPSULE | Freq: Every day | ORAL | Status: DC

## 2011-10-10 ENCOUNTER — Other Ambulatory Visit: Payer: Self-pay | Admitting: Internal Medicine

## 2011-10-10 MED ORDER — ATORVASTATIN CALCIUM 80 MG PO TABS: 80.0000 mg | ORAL_TABLET | Freq: Every day | ORAL | Status: DC

## 2011-10-13 ENCOUNTER — Encounter: Payer: Self-pay | Admitting: Internal Medicine

## 2011-10-13 ENCOUNTER — Ambulatory Visit (INDEPENDENT_AMBULATORY_CARE_PROVIDER_SITE_OTHER): Payer: Medicare Other | Admitting: Internal Medicine

## 2011-10-13 VITALS — BP 134/78 | HR 80 | Temp 98.4°F | Resp 16 | Wt 148.5 lb

## 2011-10-13 DIAGNOSIS — E119 Type 2 diabetes mellitus without complications: Secondary | ICD-10-CM | POA: Diagnosis not present

## 2011-10-13 DIAGNOSIS — R51 Headache: Secondary | ICD-10-CM

## 2011-10-13 DIAGNOSIS — E039 Hypothyroidism, unspecified: Secondary | ICD-10-CM | POA: Diagnosis not present

## 2011-10-13 DIAGNOSIS — R197 Diarrhea, unspecified: Secondary | ICD-10-CM

## 2011-10-13 DIAGNOSIS — I739 Peripheral vascular disease, unspecified: Secondary | ICD-10-CM | POA: Diagnosis not present

## 2011-10-13 DIAGNOSIS — R5381 Other malaise: Secondary | ICD-10-CM

## 2011-10-13 DIAGNOSIS — E1159 Type 2 diabetes mellitus with other circulatory complications: Secondary | ICD-10-CM | POA: Diagnosis not present

## 2011-10-13 DIAGNOSIS — E1151 Type 2 diabetes mellitus with diabetic peripheral angiopathy without gangrene: Secondary | ICD-10-CM

## 2011-10-13 DIAGNOSIS — R5383 Other fatigue: Secondary | ICD-10-CM

## 2011-10-13 DIAGNOSIS — E785 Hyperlipidemia, unspecified: Secondary | ICD-10-CM

## 2011-10-13 DIAGNOSIS — E538 Deficiency of other specified B group vitamins: Secondary | ICD-10-CM | POA: Diagnosis not present

## 2011-10-13 DIAGNOSIS — I798 Other disorders of arteries, arterioles and capillaries in diseases classified elsewhere: Secondary | ICD-10-CM | POA: Diagnosis not present

## 2011-10-13 DIAGNOSIS — I2510 Atherosclerotic heart disease of native coronary artery without angina pectoris: Secondary | ICD-10-CM

## 2011-10-13 LAB — COMPLETE METABOLIC PANEL WITH GFR
ALT: 12 U/L (ref 0–35)
AST: 13 U/L (ref 0–37)
Albumin: 4.1 g/dL (ref 3.5–5.2)
Alkaline Phosphatase: 74 U/L (ref 39–117)
BUN: 16 mg/dL (ref 6–23)
CO2: 25 mEq/L (ref 19–32)
Calcium: 9.5 mg/dL (ref 8.4–10.5)
Chloride: 106 mEq/L (ref 96–112)
Creat: 1.06 mg/dL (ref 0.50–1.10)
GFR, Est African American: 65 mL/min
GFR, Est Non African American: 56 mL/min — ABNORMAL LOW
Glucose, Bld: 102 mg/dL — ABNORMAL HIGH (ref 70–99)
Potassium: 4.3 mEq/L (ref 3.5–5.3)
Sodium: 139 mEq/L (ref 135–145)
Total Bilirubin: 0.3 mg/dL (ref 0.3–1.2)
Total Protein: 6.5 g/dL (ref 6.0–8.3)

## 2011-10-13 MED ORDER — PRAVASTATIN SODIUM 80 MG PO TABS: 80.0000 mg | ORAL_TABLET | Freq: Every evening | ORAL | Status: DC

## 2011-10-13 NOTE — Patient Instructions (Signed)
You appear to be dehydrated from the diarrhea  Please collect the next runny stool in the vials we send you home with so we can rule out infection as a cause

## 2011-10-13 NOTE — Progress Notes (Signed)
Patient ID: Sarah Payne, female   DOB: [birth year], 66 y.o.   MRN: [id number]    Patient Active Problem List  Diagnoses  . CAD, multiple vessel  . Tobacco abuse  . Hyperlipidemia  . Dizziness  . Fatigue  . Hypothyroidism  . Depression  . 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 Diabetes mellitus type 2 with peripheral artery disease  . Non-ST elevation myocardial infarction (NSTEMI), subendocardial infarction, subsequent episode of care  . Angina at rest  . Headache  . Diarrhea    Subjective:  CC:   Chief Complaint  Patient presents with  . Headache  . Fatigue    HPI:   Sarah Payne a 66 y.o. female who presents prior to scheduled hospital follow up with a multiple symptoms needing attention.  She has had a headache every day for the last 10  Days,  Apparently since being discharged from the hospital..  She underwent a second cardiac catheterization recently by [facility] for stenting of the LCX that was found to be nearly occluded on prior cath during admission 2/26 for NSTEMI.  She reports extreme fatigue and hypersomnia, as well as frequent loose stools which started around  the time of her last hospitalization and have been occurring several times daily.  They are associated with occasional  nausea and vomiting of watery nonbloody material. She denies any new medications. Has not been taking metformin or her statin because of confusion about her discharge medications.   Past Medical History  Diagnosis Date  . Diabetes mellitus   . Hypertension   . Hypothyroidism   . Depression   . Vascular dementia   . Coronary artery disease     a. s/p PCI/DES LCX x 3;  b. NSTEMI 09/16/11;  c.   09/17/11 Cath [facility]) Severe RCA/LCX dzs - RCA treated, LCX intervention pending w/ Dr. Juliann Pares  . Tobacco abuse     a. 45 pack year history - quit 09/16/11  . Chronic low back pain     Past Surgical History  Procedure Date  . Back surgery   . Abdominal hysterectomy   .  Appendectomy   . Cardiac catheterization   . Coronary angioplasty 2012    stent x 3          The following portions of the patient's history were reviewed and updated as appropriate: Allergies, current medications, and problem list.    Review of Systems:   12 Pt  review of systems was negative except those addressed in the HPI,     History   Social History  . Marital Status: Married    Spouse Name: N/A    Number of Children: N/A  . Years of Education: N/A   Occupational History  . Not on file.   Social History Main Topics  . Smoking status: Former Smoker -- 1.0 packs/day for 45 years    Types: Cigarettes    Quit date: 09/16/2011  . Smokeless tobacco: Never Used   Comment: Has cut back, trying to quit.  . Alcohol Use: No  . Drug Use: No  . Sexually Active: Not on file   Other Topics Concern  . Not on file   Social History Narrative   Lives at home with her husband in [place].  Previously used marijuana - quit.    Objective:  BP 134/78  Pulse 80  Temp(Src) 98.4 F (36.9 C) (Oral)  Resp 16  Wt 148 lb 8 oz (67.359 kg)  SpO2 98%  General appearance: alert, cooperative and appears stated age Ears: normal TM's and external ear canals both ears Throat: lips, mucosa, and tongue normal; teeth and gums normal Neck: no adenopathy, no carotid bruit, supple, symmetrical, trachea midline and thyroid not enlarged, symmetric, no tenderness/mass/nodules Back: symmetric, no curvature. ROM normal. No CVA tenderness. Lungs: clear to auscultation bilaterally Heart: regular rate and rhythm, S1, S2 normal, no murmur, click, rub or gallop Abdomen: soft, non-tender; bowel sounds normal; no masses,  no organomegaly Pulses: 2+ and symmetric Skin: Skin color, texture, turgor normal. No rashes or lesions Lymph nodes: Cervical, supraclavicular, and axillary nodes normal.  Assessment and Plan:  Diabetes mellitus type 2 with peripheral artery disease Given her persistent  nausea will suspend metformin indeifinitely.  hgba1c is 7.2 currently.  Hyperlipidemia LDL is 82 but trigs are 529.  Will repeat prior to adding additional medications.  Continue pravastatin 80 mg daily for now  Hypothyroidism With wide fluctuations due to medication lapses complicated by patient's dementia and family's inconsistent monitoring of medication adherence.  TSH is suppressed today,  Suggesting they have been more diligent.  Will reduce Synthroid dose and recheck in 6 weeks.   Headache With nonfocal neurologic exam.  May be due to Imdur whhich was started several weeks ago pending cardiac cath.  Will suspend for now.   Diarrhea Likely secondary to norovirus.  She had a large normal stool here in the office so I think this problem is likely resolving, but she was sent home with sterile stool containers to fill should the diarrhea return, to rule out C dificile given recent hospitalization. Instructions on dietary modificatiosn for adult diarrhea given to daughter for use if symptoms recur.   CAD, multiple vessel With recent NSTEMI and cardiac cath x 2 with PTCA x 3 of RCA and LCA.  Encourage patient to abstain from tobacco, continue Plavix and all other medications, and will enroll in outpatient C/P rehab at [facility] when feeling better.  Daughterseemss committed to taking patient.     Updated Medication List Outpatient Encounter Prescriptions as of 10/13/2011  Medication Sig Dispense Refill  . aspirin 81 MG tablet Take 81 mg by mouth daily.        . Calcium & Magnesium Carbonates (MYLANTA PO) Take by mouth.        . carvedilol (COREG) 3.125 MG tablet Take 1 tablet (3.125 mg total) by mouth 2 (two) times daily.  60 tablet  3  . clopidogrel (PLAVIX) 75 MG tablet Take 1 tablet (75 mg total) by mouth daily.  30 tablet  3  . cyanocobalamin (,VITAMIN B-12,) 1000 MCG/ML injection Inject 1,000 mcg into the muscle once.      . DULoxetine (CYMBALTA) 60 MG capsule Take 1 capsule (60 mg total)  by mouth daily.  30 capsule  2  . enalapril (VASOTEC) 10 MG tablet Take 10 mg by mouth daily.      Marland Kitchen HYDROcodone-acetaminophen (VICODIN) 5-500 MG per tablet Take 1 tablet by mouth 4 (four) times daily as needed for pain.  120 tablet  1  . levothyroxine (SYNTHROID, LEVOTHROID) 137 MCG tablet Take 1 tablet (137 mcg total) by mouth daily.  30 tablet  3  . magnesium oxide (MAG-OX) 400 MG tablet Take 400 mg by mouth daily.        . nicotine (NICODERM CQ - DOSED IN MG/24 HOURS) 14 mg/24hr patch Place 1 patch onto the skin daily.      . nitroGLYCERIN (NITROSTAT) 0.4 MG SL  tablet Place 0.4 mg under the tongue every 5 (five) minutes as needed.        . pravastatin (PRAVACHOL) 80 MG tablet Take 1 tablet (80 mg total) by mouth every evening.  30 tablet  11  . traZODone (DESYREL) 100 MG tablet Take 1 tablet (100 mg total) by mouth at bedtime.  30 tablet  5  . DISCONTD: atorvastatin (LIPITOR) 80 MG tablet Take 1 tablet (80 mg total) by mouth daily.  90 tablet  3  . DISCONTD: isosorbide mononitrate (IMDUR) 30 MG 24 hr tablet Take 1 tablet (30 mg total) by mouth daily.  30 tablet  6  . DISCONTD: levothyroxine (SYNTHROID, LEVOTHROID) 150 MCG tablet TAKE 1 TABLET (150 MCG TOTAL) BY MOUTH DAILY.  30 tablet  3  . DISCONTD: metFORMIN (GLUMETZA) 1000 MG (MOD) 24 hr tablet Take 1,000 mg by mouth 2 (two) times daily with a meal.        . DISCONTD: pravastatin (PRAVACHOL) 40 MG tablet Take 1 tablet (40 mg total) by mouth every evening.  30 tablet  11     Orders Placed This Encounter  Procedures  . Stool Culture  . Ova and parasite examination  . CBC with Differential  . Magnesium  . CBC with Differential  . LDL cholesterol, direct    No Follow-up on file.

## 2011-10-14 LAB — CBC WITH DIFFERENTIAL/PLATELET
Basophils Absolute: 0 10*3/uL (ref 0.0–0.1)
Basophils Relative: 0.4 % (ref 0.0–3.0)
Eosinophils Absolute: 0.2 10*3/uL (ref 0.0–0.7)
Eosinophils Relative: 2.2 % (ref 0.0–5.0)
HCT: 35.6 % — ABNORMAL LOW (ref 36.0–46.0)
Hemoglobin: 12 g/dL (ref 12.0–15.0)
Lymphocytes Relative: 32.5 % (ref 12.0–46.0)
Lymphs Abs: 2.4 10*3/uL (ref 0.7–4.0)
MCHC: 33.8 g/dL (ref 30.0–36.0)
MCV: 94.7 fl (ref 78.0–100.0)
Monocytes Absolute: 0.6 10*3/uL (ref 0.1–1.0)
Monocytes Relative: 7.6 % (ref 3.0–12.0)
Neutro Abs: 4.2 10*3/uL (ref 1.4–7.7)
Neutrophils Relative %: 57.3 % (ref 43.0–77.0)
Platelets: 296 10*3/uL (ref 150.0–400.0)
RBC: 3.76 Mil/uL — ABNORMAL LOW (ref 3.87–5.11)
RDW: 13.3 % (ref 11.5–14.6)
WBC: 7.3 10*3/uL (ref 4.5–10.5)

## 2011-10-14 LAB — LIPID PANEL
Cholesterol: 164 mg/dL (ref 0–200)
HDL: 37.9 mg/dL — ABNORMAL LOW (ref >39.00)
Total CHOL/HDL Ratio: 4
Triglycerides: 529 mg/dL — ABNORMAL HIGH (ref 0.0–149.0)
VLDL: 105.8 mg/dL — ABNORMAL HIGH (ref 0.0–40.0)

## 2011-10-14 LAB — MAGNESIUM: Magnesium: 1.8 mg/dL (ref 1.5–2.5)

## 2011-10-14 LAB — VITAMIN B12: Vitamin B-12: 542 pg/mL (ref 211–911)

## 2011-10-14 LAB — TSH: TSH: 0.18 u[IU]/mL — ABNORMAL LOW (ref 0.35–5.50)

## 2011-10-14 LAB — LDL CHOLESTEROL, DIRECT: Direct LDL: 81.6 mg/dL

## 2011-10-14 LAB — HEMOGLOBIN A1C: Hgb A1c MFr Bld: 7.2 % — ABNORMAL HIGH (ref 4.6–6.5)

## 2011-10-15 ENCOUNTER — Encounter: Payer: Self-pay | Admitting: Internal Medicine

## 2011-10-15 DIAGNOSIS — R519 Headache, unspecified: Secondary | ICD-10-CM | POA: Insufficient documentation

## 2011-10-15 DIAGNOSIS — R51 Headache: Secondary | ICD-10-CM | POA: Insufficient documentation

## 2011-10-15 DIAGNOSIS — R197 Diarrhea, unspecified: Secondary | ICD-10-CM | POA: Insufficient documentation

## 2011-10-15 MED ORDER — LEVOTHYROXINE SODIUM 137 MCG PO TABS: 137.0000 ug | ORAL_TABLET | Freq: Every day | ORAL | Status: DC

## 2011-10-15 NOTE — Assessment & Plan Note (Signed)
With nonfocal neurologic exam.  May be due to Imdur whhich was started several weeks ago pending cardiac cath.  Will suspend for now.

## 2011-10-15 NOTE — Assessment & Plan Note (Signed)
Given her persistent nausea will suspend metformin indeifinitely.  hgba1c is 7.2 currently.

## 2011-10-15 NOTE — Assessment & Plan Note (Signed)
Likely secondary to norovirus.  She had a large normal stool here in the office so I think this problem is likely resolving, but she was sent home with sterile stool containers to fill should the diarrhea return, to rule out C dificile given recent hospitalization. Instructions on dietary modificatiosn for adult diarrhea given to daughter for use if symptoms recur.

## 2011-10-15 NOTE — Assessment & Plan Note (Signed)
With wide fluctuations due to medication lapses complicated by patient's dementia and family's inconsistent monitoring of medication adherence.  TSH is suppressed today,  Suggesting they have been more diligent.  Will reduce Synthroid dose and recheck in 6 weeks.

## 2011-10-15 NOTE — Assessment & Plan Note (Signed)
With recent NSTEMI and cardiac cath x 2 with PTCA x 3 of RCA and LCA.  Encourage patient to abstain from tobacco, continue Plavix and all other medications, and will enroll in outpatient C/P rehab at [facility] when feeling better.  Daughterseemss committed to taking patient.

## 2011-10-15 NOTE — Assessment & Plan Note (Signed)
LDL is 82 but trigs are 529.  Will repeat prior to adding additional medications.  Continue pravastatin 80 mg daily for now

## 2011-10-16 ENCOUNTER — Ambulatory Visit: Payer: Medicare Other | Admitting: Internal Medicine

## 2011-10-20 ENCOUNTER — Other Ambulatory Visit: Payer: Self-pay | Admitting: Internal Medicine

## 2011-10-20 MED ORDER — DULOXETINE HCL 60 MG PO CPEP: 60.0000 mg | ORAL_CAPSULE | Freq: Every day | ORAL | Status: DC

## 2011-10-21 ENCOUNTER — Other Ambulatory Visit: Payer: Medicare Other

## 2011-10-28 ENCOUNTER — Ambulatory Visit (INDEPENDENT_AMBULATORY_CARE_PROVIDER_SITE_OTHER): Payer: Medicare Other | Admitting: Cardiovascular Disease

## 2011-10-28 DIAGNOSIS — R0989 Other specified symptoms and signs involving the circulatory and respiratory systems: Secondary | ICD-10-CM

## 2011-10-29 ENCOUNTER — Encounter: Payer: Self-pay | Admitting: Cardiovascular Disease

## 2011-10-29 DIAGNOSIS — I252 Old myocardial infarction: Secondary | ICD-10-CM | POA: Diagnosis not present

## 2011-10-29 DIAGNOSIS — Z9861 Coronary angioplasty status: Secondary | ICD-10-CM | POA: Diagnosis not present

## 2011-10-29 DIAGNOSIS — Z5189 Encounter for other specified aftercare: Secondary | ICD-10-CM | POA: Diagnosis not present

## 2011-10-30 NOTE — Progress Notes (Signed)
   Patient ID: Sarah Payne, female    DOB: 1946-04-11, 66 y.o.   MRN: [id number]  HPI Comments: error     Review of Systems    Physical Exam

## 2011-10-31 ENCOUNTER — Encounter: Payer: Self-pay | Admitting: Cardiovascular Disease

## 2011-11-10 ENCOUNTER — Other Ambulatory Visit: Payer: Self-pay | Admitting: Internal Medicine

## 2011-11-10 DIAGNOSIS — E785 Hyperlipidemia, unspecified: Secondary | ICD-10-CM

## 2011-11-10 MED ORDER — CLOPIDOGREL BISULFATE 75 MG PO TABS: 75.0000 mg | ORAL_TABLET | Freq: Every day | ORAL | Status: DC

## 2011-11-10 MED ORDER — TRAZODONE HCL 100 MG PO TABS: 100.0000 mg | ORAL_TABLET | Freq: Every day | ORAL | Status: DC

## 2011-11-10 MED ORDER — PRAVASTATIN SODIUM 80 MG PO TABS: 80.0000 mg | ORAL_TABLET | Freq: Every evening | ORAL | Status: DC

## 2011-11-10 MED ORDER — DONEPEZIL HCL 5 MG PO TABS: 5.0000 mg | ORAL_TABLET | Freq: Every evening | ORAL | Status: DC | PRN

## 2011-11-11 ENCOUNTER — Other Ambulatory Visit: Payer: Self-pay | Admitting: Internal Medicine

## 2011-11-11 MED ORDER — CARVEDILOL 3.125 MG PO TABS: 3.1250 mg | ORAL_TABLET | Freq: Two times a day (BID) | ORAL | Status: DC

## 2011-11-11 MED ORDER — ENALAPRIL MALEATE 10 MG PO TABS: 10.0000 mg | ORAL_TABLET | Freq: Every day | ORAL | Status: DC

## 2011-11-19 ENCOUNTER — Encounter: Payer: Self-pay | Admitting: Cardiovascular Disease

## 2011-11-24 ENCOUNTER — Ambulatory Visit (INDEPENDENT_AMBULATORY_CARE_PROVIDER_SITE_OTHER): Payer: Medicare Other | Admitting: Internal Medicine

## 2011-11-24 ENCOUNTER — Other Ambulatory Visit: Payer: Self-pay | Admitting: Internal Medicine

## 2011-11-24 ENCOUNTER — Encounter: Payer: Self-pay | Admitting: Internal Medicine

## 2011-11-24 ENCOUNTER — Other Ambulatory Visit: Payer: Self-pay | Admitting: *Deleted

## 2011-11-24 VITALS — BP 118/74 | HR 85 | Temp 97.9°F | Resp 18 | Wt 147.2 lb

## 2011-11-24 DIAGNOSIS — M545 Low back pain, unspecified: Secondary | ICD-10-CM

## 2011-11-24 DIAGNOSIS — E039 Hypothyroidism, unspecified: Secondary | ICD-10-CM | POA: Diagnosis not present

## 2011-11-24 DIAGNOSIS — G4700 Insomnia, unspecified: Secondary | ICD-10-CM | POA: Diagnosis not present

## 2011-11-24 DIAGNOSIS — Z79899 Other long term (current) drug therapy: Secondary | ICD-10-CM

## 2011-11-24 DIAGNOSIS — R51 Headache: Secondary | ICD-10-CM | POA: Diagnosis not present

## 2011-11-24 DIAGNOSIS — E1151 Type 2 diabetes mellitus with diabetic peripheral angiopathy without gangrene: Secondary | ICD-10-CM

## 2011-11-24 DIAGNOSIS — E1159 Type 2 diabetes mellitus with other circulatory complications: Secondary | ICD-10-CM | POA: Diagnosis not present

## 2011-11-24 DIAGNOSIS — E785 Hyperlipidemia, unspecified: Secondary | ICD-10-CM | POA: Diagnosis not present

## 2011-11-24 DIAGNOSIS — I798 Other disorders of arteries, arterioles and capillaries in diseases classified elsewhere: Secondary | ICD-10-CM

## 2011-11-24 DIAGNOSIS — I739 Peripheral vascular disease, unspecified: Secondary | ICD-10-CM

## 2011-11-24 DIAGNOSIS — R519 Headache, unspecified: Secondary | ICD-10-CM

## 2011-11-24 LAB — COMPLETE METABOLIC PANEL WITH GFR
ALT: <8 U/L (ref 0–35)
AST: 12 U/L (ref 0–37)
Albumin: 4.1 g/dL (ref 3.5–5.2)
Alkaline Phosphatase: 63 U/L (ref 39–117)
BUN: 18 mg/dL (ref 6–23)
CO2: 24 mEq/L (ref 19–32)
Calcium: 9.6 mg/dL (ref 8.4–10.5)
Chloride: 102 mEq/L (ref 96–112)
Creat: 1.07 mg/dL (ref 0.50–1.10)
GFR, Est African American: 64 mL/min
GFR, Est Non African American: 55 mL/min — ABNORMAL LOW
Glucose, Bld: 160 mg/dL — ABNORMAL HIGH (ref 70–99)
Potassium: 3.9 mEq/L (ref 3.5–5.3)
Sodium: 135 mEq/L (ref 135–145)
Total Bilirubin: 0.3 mg/dL (ref 0.3–1.2)
Total Protein: 6.6 g/dL (ref 6.0–8.3)

## 2011-11-24 MED ORDER — HYDROCODONE-ACETAMINOPHEN 5-500 MG PO TABS: 1.0000 | ORAL_TABLET | Freq: Four times a day (QID) | ORAL | Status: DC | PRN

## 2011-11-24 MED ORDER — AMITRIPTYLINE HCL 150 MG PO TABS: ORAL_TABLET | ORAL | Status: DC

## 2011-11-24 NOTE — Progress Notes (Signed)
Patient ID: NHI BUTRUM, female   DOB: [birth year], 66 y.o.   MRN: [id number]  Patient Active Problem List  Diagnoses  . CAD, multiple vessel  . Tobacco abuse  . Hyperlipidemia  . Dizziness  . Fatigue  . Hypothyroidism  . Depression  . 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 Diabetes mellitus type 2 with peripheral artery disease  . Non-ST elevation myocardial infarction (NSTEMI), subendocardial infarction, subsequent episode of care  . Angina at rest  . Headache  . Diarrhea    Subjective:  CC:   Chief Complaint  Patient presents with  . Follow-up    HPI:   Sarah Payne a 66 y.o. female who presents Follow up on mulitple  chronic issues,  Last seen late April .  unaccompanied by her family.   still having daily occipital headaches which radiate to her neck.  Not responding to vicodin 3 times daily, helping her low back pain , but doesn't touch her headaches which last all day.  Occur daily after she wakes up and has her coffee .    Nausea is recurrent ,  Occurring once or twice weekly generally between 10 and 2.  First meal of the day is a sandwich around noon.  She states that she has stopped her metformin and is often forgetting to take her glipizide as directed.  Sarah Payne is filling her pill box  And checks it daily.     Past Medical History  Diagnosis Date  . Diabetes mellitus   . Hypertension   . Hypothyroidism   . Depression   . Vascular dementia   . Coronary artery disease     a. s/p PCI/DES LCX x 3;  b. NSTEMI 09/16/11;  c.   09/17/11 Cath [facility]) Severe RCA/LCX dzs - RCA treated, LCX intervention pending w/ Dr. Juliann Pares  . Tobacco abuse     a. 45 pack year history - quit 09/16/11  . Chronic low back pain     Past Surgical History  Procedure Date  . Back surgery   . Abdominal hysterectomy   . Appendectomy   . Cardiac catheterization   . Coronary angioplasty 2012    stent x 3          The following portions of the patient's history were  reviewed and updated as appropriate: Allergies, current medications, and problem list.    Review of Systems:   12 Pt  review of systems was negative except those addressed in the HPI,     History   Social History  . Marital Status: Married    Spouse Name: N/A    Number of Children: N/A  . Years of Education: N/A   Occupational History  . Not on file.   Social History Main Topics  . Smoking status: Former Smoker -- 1.0 packs/day for 45 years    Types: Cigarettes    Quit date: 09/16/2011  . Smokeless tobacco: Never Used   Comment: Has cut back, trying to quit.  . Alcohol Use: No  . Drug Use: No  . Sexually Active: Not on file   Other Topics Concern  . Not on file   Social History Narrative   Lives at home with her husband in [place].  Previously used marijuana - quit.    Objective:  BP 118/74  Pulse 85  Temp(Src) 97.9 F (36.6 C) (Oral)  Resp 18  Wt 147 lb 4 oz (66.792 kg)  SpO2 97%  General appearance: alert, cooperative  and appears stated age Ears: normal TM's and external ear canals both ears Throat: lips, mucosa, and tongue normal; teeth and gums normal Neck: no adenopathy, no carotid bruit, supple, symmetrical, trachea midline and thyroid not enlarged, symmetric, no tenderness/mass/nodules Back: symmetric, no curvature. ROM normal. No CVA tenderness. Lungs: clear to auscultation bilaterally Heart: regular rate and rhythm, S1, S2 normal, no murmur, click, rub or gallop Abdomen: soft, non-tender; bowel sounds normal; no masses,  no organomegaly Pulses: 2+ and symmetric Skin: Skin color, texture, turgor normal. No rashes or lesions Lymph nodes: Cervical, supraclavicular, and axillary nodes normal.  Assessment and Plan:  Headache ddx may be cervicalgia, OSA , migraines less likely given the chronicity.  Stopping Imdur did not help. Plain films of cervical spine ordered.   Diabetes mellitus type 2 with peripheral artery disease a1c was 7,2 in late  march,. And metformin was stopped due to persistent diarrhea ,  Her fasting glucose was 160 today.  Will need to add a 2nd agent at next visit.   Hyperlipidemia Her triglycerides were over 500 and LDL was well above goal on current medication.  Her family does not appear to be monitoring her adequately with regard to medications. Labs were drawn at 3 pm but patient may not have been fasting although she stated that she was . Will have her repeat labs before addint Tricor.     Updated Medication List Outpatient Encounter Prescriptions as of 11/24/2011  Medication Sig Dispense Refill  . aspirin 81 MG tablet Take 81 mg by mouth daily.        . Calcium & Magnesium Carbonates (MYLANTA PO) Take by mouth.        . carvedilol (COREG) 3.125 MG tablet Take 1 tablet (3.125 mg total) by mouth 2 (two) times daily.  60 tablet  3  . clopidogrel (PLAVIX) 75 MG tablet Take 1 tablet (75 mg total) by mouth daily.  90 tablet  3  . cyanocobalamin (,VITAMIN B-12,) 1000 MCG/ML injection Inject 1,000 mcg into the muscle once.      . donepezil (ARICEPT) 5 MG tablet Take 1 tablet (5 mg total) by mouth at bedtime as needed.  90 tablet  3  . DULoxetine (CYMBALTA) 60 MG capsule Take 1 capsule (60 mg total) by mouth daily.  30 capsule  2  . enalapril (VASOTEC) 10 MG tablet Take 1 tablet (10 mg total) by mouth daily.  30 tablet  3  . levothyroxine (SYNTHROID, LEVOTHROID) 137 MCG tablet Take 1 tablet (137 mcg total) by mouth daily.  30 tablet  3  . magnesium oxide (MAG-OX) 400 MG tablet Take 400 mg by mouth daily.        . metoprolol succinate (TOPROL-XL) 25 MG 24 hr tablet Take 1/2 tablet daily.      . nitroGLYCERIN (NITROSTAT) 0.4 MG SL tablet Place 0.4 mg under the tongue every 5 (five) minutes as needed.        . pravastatin (PRAVACHOL) 80 MG tablet Take 1 tablet (80 mg total) by mouth every evening.  90 tablet  3  . traZODone (DESYREL) 100 MG tablet Take 1 tablet (100 mg total) by mouth at bedtime.  90 tablet  3  .  DISCONTD: HYDROcodone-acetaminophen (VICODIN) 5-500 MG per tablet Take 1 tablet by mouth 4 (four) times daily as needed for pain.  120 tablet  1  . amitriptyline (ELAVIL) 150 MG tablet Take 1 hour before bedtime every night for insomnia  30 tablet  3  . DISCONTD:  nicotine (NICODERM CQ - DOSED IN MG/24 HOURS) 14 mg/24hr patch Place 1 patch onto the skin daily.         Orders Placed This Encounter  Procedures  . DG Cervical Spine Complete  . TSH  . COMPLETE METABOLIC PANEL WITH GFR    Return in about 1 month (around 12/25/2011).

## 2011-11-24 NOTE — Patient Instructions (Addendum)
We are adding amitryptiline : take 1 tablet one one hour before bedtime for insomnia and headaches.   I am ordering xrays of your neck to look for causes of your headache. Please get them done at the [place] location at [facility] ( on [address] before you get to [employer] course)    We need to get a sleep evaluation because  Of your morning nausea, persistent headaches, and snoring .  Please buy Ms. Hirschmann a nutritional supplement  Like Ensure or carnation instant breakfast drink (powdered: mix with mild or with or almond milk)   PLEASE BRING A FAMILY MEMBER TO YOUR VISITS!!!!!!! YOUR MEMORY PROBLEMS ARE INTERFERING  WITH YOUR PROGRESS  I DO NOT RECOMMEND A TANNING BED.,  YOU HAVE SEVERE SKIN DAMAGE FROM ALL OF YOUR TANNING AND YOUR SKIN IS TOO DRY

## 2011-11-25 ENCOUNTER — Other Ambulatory Visit: Payer: Self-pay | Admitting: Internal Medicine

## 2011-11-25 ENCOUNTER — Encounter: Payer: Self-pay | Admitting: Internal Medicine

## 2011-11-25 DIAGNOSIS — M545 Low back pain, unspecified: Secondary | ICD-10-CM

## 2011-11-25 LAB — TSH: TSH: 0.235 u[IU]/mL — ABNORMAL LOW (ref 0.350–4.500)

## 2011-11-25 NOTE — Assessment & Plan Note (Signed)
ddx may be cervicalgia, OSA , migraines less likely given the chronicity.  Stopping Imdur did not help. Plain films of cervical spine ordered.

## 2011-11-25 NOTE — Assessment & Plan Note (Addendum)
Her triglycerides were over 500 and LDL was well above goal on current medication.  Her family does not appear to be monitoring her adequately with regard to medications. Labs were drawn at 3 pm but patient may not have been fasting although she stated that she was . Will have her repeat labs before addint Tricor.

## 2011-11-25 NOTE — Assessment & Plan Note (Signed)
a1c was 7,2 in late march,. And metformin was stopped due to persistent diarrhea ,  Her fasting glucose was 160 today.  Will need to add a 2nd agent at next visit.

## 2011-11-25 NOTE — Telephone Encounter (Signed)
Opened in error

## 2011-11-26 ENCOUNTER — Ambulatory Visit (INDEPENDENT_AMBULATORY_CARE_PROVIDER_SITE_OTHER)
Admission: RE | Admit: 2011-11-26 | Discharge: 2011-11-26 | Disposition: A | Discharge status: Home / Self Care | Payer: Medicare Other | Source: Ambulatory Visit | Attending: Internal Medicine | Admitting: Internal Medicine

## 2011-11-26 DIAGNOSIS — M5030 Other cervical disc degeneration, unspecified cervical region: Secondary | ICD-10-CM | POA: Diagnosis not present

## 2011-11-26 DIAGNOSIS — M47812 Spondylosis without myelopathy or radiculopathy, cervical region: Secondary | ICD-10-CM | POA: Diagnosis not present

## 2011-11-26 DIAGNOSIS — R519 Headache, unspecified: Secondary | ICD-10-CM

## 2011-11-26 DIAGNOSIS — R51 Headache: Secondary | ICD-10-CM

## 2011-11-28 MED ORDER — LEVOTHYROXINE SODIUM 125 MCG PO TABS: 125.0000 ug | ORAL_TABLET | Freq: Every day | ORAL | Status: DC

## 2011-11-28 NOTE — Progress Notes (Signed)
Addended by: Duncan Dull on: 11/28/2011 05:20 PM   Modules accepted: Orders

## 2011-12-18 ENCOUNTER — Other Ambulatory Visit: Payer: Self-pay | Admitting: Internal Medicine

## 2011-12-18 DIAGNOSIS — M545 Low back pain, unspecified: Secondary | ICD-10-CM

## 2011-12-18 MED ORDER — HYDROCODONE-ACETAMINOPHEN 5-500 MG PO TABS: 1.0000 | ORAL_TABLET | Freq: Four times a day (QID) | ORAL | Status: DC | PRN

## 2011-12-20 ENCOUNTER — Encounter: Payer: Self-pay | Admitting: Cardiovascular Disease

## 2012-02-20 ENCOUNTER — Telehealth: Payer: Self-pay | Admitting: Internal Medicine

## 2012-02-20 NOTE — Telephone Encounter (Signed)
Caller: Melanie/Child; PCP: Duncan Dull; CB#: [phone]; ;Pt number [phone] ; Call regarding Cough/Congestion;  Onset- 2 weeks per pt.  Afebrile. Pt c/o of cough for two weeks that makes her SOB. She states that breathing has gotten worse, she barely takes a few steps and she is out of breath. Emergent s/s of Breathing problems  protocol r/o. Pt to ED. Spoke with nurse Morrie Sheldon in office who agrees pt to ED.

## 2012-02-26 ENCOUNTER — Other Ambulatory Visit: Payer: Self-pay | Admitting: Internal Medicine

## 2012-02-26 DIAGNOSIS — M545 Low back pain, unspecified: Secondary | ICD-10-CM

## 2012-02-26 LAB — HM DIABETES EYE EXAM

## 2012-02-27 MED ORDER — HYDROCODONE-ACETAMINOPHEN 5-500 MG PO TABS: 1.0000 | ORAL_TABLET | Freq: Four times a day (QID) | ORAL | Status: DC | PRN

## 2012-03-06 ENCOUNTER — Other Ambulatory Visit: Payer: Self-pay | Admitting: Internal Medicine

## 2012-04-07 ENCOUNTER — Other Ambulatory Visit: Payer: Self-pay | Admitting: Internal Medicine

## 2012-04-19 ENCOUNTER — Other Ambulatory Visit: Payer: Self-pay | Admitting: Internal Medicine

## 2012-04-19 ENCOUNTER — Other Ambulatory Visit: Payer: Self-pay

## 2012-05-17 ENCOUNTER — Other Ambulatory Visit: Payer: Self-pay | Admitting: Internal Medicine

## 2012-05-18 NOTE — Telephone Encounter (Signed)
Refill authorized .  Patient due for ov asap

## 2012-05-19 ENCOUNTER — Other Ambulatory Visit: Payer: Self-pay

## 2012-05-19 NOTE — Telephone Encounter (Signed)
Hydrocodone Acetaminophen 5-500 mg faxed to [facility] [phone]

## 2012-05-20 ENCOUNTER — Ambulatory Visit (INDEPENDENT_AMBULATORY_CARE_PROVIDER_SITE_OTHER): Payer: Medicare Other | Admitting: Internal Medicine

## 2012-05-20 ENCOUNTER — Encounter: Payer: Self-pay | Admitting: Internal Medicine

## 2012-05-20 ENCOUNTER — Telehealth: Payer: Self-pay | Admitting: Internal Medicine

## 2012-05-20 ENCOUNTER — Emergency Department: Payer: Self-pay | Admitting: Emergency Medicine

## 2012-05-20 VITALS — BP 112/72 | HR 94 | Temp 97.4°F | Ht 66.5 in | Wt 149.2 lb

## 2012-05-20 DIAGNOSIS — J4489 Other specified chronic obstructive pulmonary disease: Secondary | ICD-10-CM

## 2012-05-20 DIAGNOSIS — E039 Hypothyroidism, unspecified: Secondary | ICD-10-CM | POA: Diagnosis not present

## 2012-05-20 DIAGNOSIS — R079 Chest pain, unspecified: Secondary | ICD-10-CM | POA: Diagnosis not present

## 2012-05-20 DIAGNOSIS — F0390 Unspecified dementia without behavioral disturbance: Secondary | ICD-10-CM

## 2012-05-20 DIAGNOSIS — K0500 Acute gingivitis, plaque induced: Secondary | ICD-10-CM | POA: Insufficient documentation

## 2012-05-20 DIAGNOSIS — Z79899 Other long term (current) drug therapy: Secondary | ICD-10-CM | POA: Diagnosis not present

## 2012-05-20 DIAGNOSIS — E1159 Type 2 diabetes mellitus with other circulatory complications: Secondary | ICD-10-CM

## 2012-05-20 DIAGNOSIS — L940 Localized scleroderma [morphea]: Secondary | ICD-10-CM

## 2012-05-20 DIAGNOSIS — L900 Lichen sclerosus et atrophicus: Secondary | ICD-10-CM

## 2012-05-20 DIAGNOSIS — K0510 Chronic gingivitis, plaque induced: Secondary | ICD-10-CM

## 2012-05-20 DIAGNOSIS — I509 Heart failure, unspecified: Secondary | ICD-10-CM

## 2012-05-20 DIAGNOSIS — I10 Essential (primary) hypertension: Secondary | ICD-10-CM | POA: Diagnosis not present

## 2012-05-20 DIAGNOSIS — R531 Weakness: Secondary | ICD-10-CM

## 2012-05-20 DIAGNOSIS — R5381 Other malaise: Secondary | ICD-10-CM

## 2012-05-20 DIAGNOSIS — E119 Type 2 diabetes mellitus without complications: Secondary | ICD-10-CM | POA: Diagnosis not present

## 2012-05-20 DIAGNOSIS — F17200 Nicotine dependence, unspecified, uncomplicated: Secondary | ICD-10-CM

## 2012-05-20 DIAGNOSIS — I739 Peripheral vascular disease, unspecified: Secondary | ICD-10-CM

## 2012-05-20 DIAGNOSIS — I2510 Atherosclerotic heart disease of native coronary artery without angina pectoris: Secondary | ICD-10-CM | POA: Diagnosis not present

## 2012-05-20 DIAGNOSIS — Z23 Encounter for immunization: Secondary | ICD-10-CM | POA: Diagnosis not present

## 2012-05-20 DIAGNOSIS — K117 Disturbances of salivary secretion: Secondary | ICD-10-CM

## 2012-05-20 DIAGNOSIS — N904 Leukoplakia of vulva: Secondary | ICD-10-CM

## 2012-05-20 DIAGNOSIS — Z720 Tobacco use: Secondary | ICD-10-CM

## 2012-05-20 DIAGNOSIS — E1151 Type 2 diabetes mellitus with diabetic peripheral angiopathy without gangrene: Secondary | ICD-10-CM

## 2012-05-20 DIAGNOSIS — Z95818 Presence of other cardiac implants and grafts: Secondary | ICD-10-CM | POA: Diagnosis not present

## 2012-05-20 DIAGNOSIS — J449 Chronic obstructive pulmonary disease, unspecified: Secondary | ICD-10-CM

## 2012-05-20 DIAGNOSIS — E785 Hyperlipidemia, unspecified: Secondary | ICD-10-CM

## 2012-05-20 DIAGNOSIS — Z9079 Acquired absence of other genital organ(s): Secondary | ICD-10-CM | POA: Diagnosis not present

## 2012-05-20 LAB — CBC WITH DIFFERENTIAL/PLATELET
Basophils Absolute: 0 10*3/uL (ref 0.0–0.1)
Basophils Relative: 0.3 % (ref 0.0–3.0)
Eosinophils Absolute: 0 10*3/uL (ref 0.0–0.7)
Eosinophils Relative: 0.6 % (ref 0.0–5.0)
HCT: 40.6 % (ref 36.0–46.0)
Hemoglobin: 13.5 g/dL (ref 12.0–15.0)
Lymphocytes Relative: 21.9 % (ref 12.0–46.0)
Lymphs Abs: 1.6 10*3/uL (ref 0.7–4.0)
MCHC: 33.3 g/dL (ref 30.0–36.0)
MCV: 97.1 fl (ref 78.0–100.0)
Monocytes Absolute: 0.4 10*3/uL (ref 0.1–1.0)
Monocytes Relative: 5.9 % (ref 3.0–12.0)
Neutro Abs: 5.3 10*3/uL (ref 1.4–7.7)
Neutrophils Relative %: 71.3 % (ref 43.0–77.0)
Platelets: 278 10*3/uL (ref 150.0–400.0)
RBC: 4.18 Mil/uL (ref 3.87–5.11)
RDW: 12.9 % (ref 11.5–14.6)
WBC: 7.4 10*3/uL (ref 4.5–10.5)

## 2012-05-20 LAB — COMPREHENSIVE METABOLIC PANEL
ALT: 16 U/L (ref 0–35)
AST: 19 U/L (ref 0–37)
Albumin: 4.1 g/dL (ref 3.5–5.2)
Albumin: 4.7 g/dL (ref 3.4–5.0)
Alkaline Phosphatase: 108 U/L (ref 39–117)
Alkaline Phosphatase: 181 U/L — ABNORMAL HIGH (ref 50–136)
Anion Gap: 13 (ref 7–16)
BUN: 23 mg/dL (ref 6–23)
BUN: 24 mg/dL — ABNORMAL HIGH (ref 7–18)
Bilirubin,Total: 0.3 mg/dL (ref 0.2–1.0)
CO2: 27 mEq/L (ref 19–32)
Calcium, Total: 9.6 mg/dL (ref 8.5–10.1)
Calcium: 9.1 mg/dL (ref 8.4–10.5)
Chloride: 87 mEq/L — ABNORMAL LOW (ref 96–112)
Chloride: 91 mmol/L — ABNORMAL LOW (ref 98–107)
Co2: 24 mmol/L (ref 21–32)
Creatinine, Ser: 1.5 mg/dL — ABNORMAL HIGH (ref 0.4–1.2)
Creatinine: 1.41 mg/dL — ABNORMAL HIGH (ref 0.60–1.30)
EGFR (African American): 46 — ABNORMAL LOW
EGFR (Non-African Amer.): 39 — ABNORMAL LOW
GFR: 37.71 mL/min — ABNORMAL LOW (ref >60.00)
Glucose, Bld: 642 mg/dL (ref 70–99)
Glucose: 571 mg/dL (ref 65–99)
Osmolality: 287 (ref 275–301)
Potassium: 3.9 mEq/L (ref 3.5–5.1)
Potassium: 3.8 mmol/L (ref 3.5–5.1)
SGOT(AST): 17 U/L (ref 15–37)
SGPT (ALT): 23 U/L (ref 12–78)
Sodium: 124 mEq/L — ABNORMAL LOW (ref 135–145)
Sodium: 128 mmol/L — ABNORMAL LOW (ref 136–145)
Total Bilirubin: 0.5 mg/dL (ref 0.3–1.2)
Total Protein: 7.2 g/dL (ref 6.0–8.3)
Total Protein: 8.6 g/dL — ABNORMAL HIGH (ref 6.4–8.2)

## 2012-05-20 LAB — URINALYSIS, COMPLETE
Bacteria: NONE SEEN
Bilirubin,UR: NEGATIVE
Glucose,UR: >=500 mg/dL (ref 0–75)
Ketone: NEGATIVE
Leukocyte Esterase: NEGATIVE
Nitrite: NEGATIVE
Ph: 5 (ref 4.5–8.0)
Protein: NEGATIVE
RBC,UR: 3 /HPF (ref 0–5)
Specific Gravity: 1.029 (ref 1.003–1.030)
Squamous Epithelial: NONE SEEN
WBC UR: <1 /HPF (ref 0–5)

## 2012-05-20 LAB — CBC
HCT: 41 % (ref 35.0–47.0)
HGB: 14.3 g/dL (ref 12.0–16.0)
MCH: 33.6 pg (ref 26.0–34.0)
MCHC: 34.8 g/dL (ref 32.0–36.0)
MCV: 97 fL (ref 80–100)
Platelet: 291 10*3/uL (ref 150–440)
RBC: 4.24 10*6/uL (ref 3.80–5.20)
RDW: 12.7 % (ref 11.5–14.5)
WBC: 9.7 10*3/uL (ref 3.6–11.0)

## 2012-05-20 LAB — TSH: TSH: 13.62 u[IU]/mL — ABNORMAL HIGH (ref 0.35–5.50)

## 2012-05-20 MED ORDER — ZOLPIDEM TARTRATE 5 MG PO TABS: 10.0000 mg | ORAL_TABLET | Freq: Every evening | ORAL | Status: DC | PRN

## 2012-05-20 MED ORDER — CLOBETASOL PROPIONATE 0.05 % EX OINT: TOPICAL_OINTMENT | CUTANEOUS | Status: DC

## 2012-05-20 MED ORDER — CHLORHEXIDINE GLUCONATE 0.12 % MT SOLN: OROMUCOSAL | Status: DC

## 2012-05-20 NOTE — Progress Notes (Addendum)
Patient ID: Sarah Payne, female   DOB: 10/25/1945, 66 y.o.   MRN: [id number]    Patient Active Problem List  Diagnosis  . CAD, multiple vessel  . Tobacco abuse  . Hyperlipidemia  . Dizziness  . Fatigue  . Hypothyroidism  . Depression  . 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 Diabetes mellitus type 2 with peripheral artery disease  . Angina at rest  . Headache  . Diarrhea  . Gingivitis, acute, plaque induced  . Lichen sclerosus et atrophicus    Subjective:  CC:   Chief Complaint  Patient presents with  . Vaginal Pain    HPI:   Sarah Payne a 66 y.o. female who presents Multiple acute and chronic issues.  Patient has 2 vessel CAD with prior PTCA/stents and restenosis secondary to ongoing tobacco abuse and diabetes complicated by dementia who presents with exertional dyspnea (chronic), generalized weakness )chronic) , vaginal burning,  Persistent cough, and gingivitis. Brought in by daughter.  She missed her cardiology appt with [facility] in April due to illness but did not reschedule.  She has not been seen for diabetes or hypothyroid followup since May.  She is still smoking.  Her husband is 100% disabled from PTSD and per daughter is not actively engaged in patient's health matters or even sup[ervising her medication administration.  Patient reports feeling  profoundly weak in the legs,  And has developed painful burning sensation in both legs.  Has occasional dizziness  No chest pain , fevers and chills, no nausea or fluid retention. Has had a cough for 2.5 weeks per daughter.  Her Coughing to the point of gagging, currently nonproductive.  Smoking a pack of cigarettes daily. Per daughter drinks several sodas daily,  Has noticed red gums and teeth loosening and pain with chewing.    Past Medical History  Diagnosis Date  . Diabetes mellitus   . Hypertension   . Hypothyroidism   . Depression   . Vascular dementia   . Coronary artery disease     a. s/p PCI/DES  LCX x 3;  b. NSTEMI 09/16/11;  c.   09/17/11 Cath [facility]) Severe RCA/LCX dzs - RCA treated, LCX intervention pending w/ Dr. Juliann Pares  . Tobacco abuse     a. 45 pack year history - quit 09/16/11  . Chronic low back pain     Past Surgical History  Procedure Date  . Back surgery   . Abdominal hysterectomy   . Appendectomy   . Cardiac catheterization   . Coronary angioplasty 2012    stent x 3          The following portions of the patient's history were reviewed and updated as appropriate: Allergies, current medications, and problem list.    Review of Systems:   12 Pt  review of systems was negative except those addressed in the HPI,     History   Social History  . Marital Status: Married    Spouse Name: N/A    Number of Children: N/A  . Years of Education: N/A   Occupational History  . Not on file.   Social History Main Topics  . Smoking status: Former Smoker -- 1.0 packs/day for 45 years    Types: Cigarettes    Quit date: 09/16/2011  . Smokeless tobacco: Never Used   Comment: Has cut back, trying to quit.  . Alcohol Use: No  . Drug Use: No  . Sexually Active: Not on file   Other  Topics Concern  . Not on file   Social History Narrative   Lives at home with her husband in [place].  Previously used marijuana - quit.    Objective:  BP 112/72  Pulse 94  Temp 97.4 F (36.3 C) (Oral)  Ht 5' 6.5" (1.689 m)  Wt 149 lb 4 oz (67.699 kg)  BMI 23.73 kg/m2  SpO2 95%  General appearance: alert, cooperative and appears stated age Ears: normal TM's and external ear canals both ears Throat: lips, mucosa, and tongue normal; teeth and gums normal Neck: no adenopathy, no carotid bruit, supple, symmetrical, trachea midline and thyroid not enlarged, symmetric, no tenderness/mass/nodules Back: symmetric, no curvature. ROM normal. No CVA tenderness. Lungs: clear to auscultation bilaterally Heart: regular rate and rhythm, S1, S2 normal, no murmur, click, rub or  gallop Abdomen: soft, non-tender; bowel sounds normal; no masses,  no organomegaly Pulses: 2+ and symmetric Skin: Skin color, texture, turgor normal. No rashes or lesions Lymph nodes: Cervical, supraclavicular, and axillary nodes normal. Pelvic exam: vulvar erythema without excoriations, or lesions.  Vaginal vault normal, no erythema or discharge.   Assessment and Plan:\  Gingivitis, acute, plaque induced Secondary to dry mouth and poor oral hygiene with no dental follow up in over one  Year. Chlorhexidine rise twice daily and referral to [facility]  Oral surgery for evaluation.   Diabetes mellitus type 2 with peripheral artery disease Does not check sugars or follow low glyemic index diet.  hgba1c due. Not sure if medications are being taken correctly.   CAD, multiple vessel  She is currently asymptomatic with regard to chest pain. EKG was run today because of her exertional dyspnea. There were no acute changes on it. Her symptoms of exertional dyspnea likely due to stage III of heart failure given her prior MI. at high risk for restenosis given her ongoing tobacco abuse. Per daughter she has been taking her medications as directed using a pill box, however daughter is not supervising their administration daily and husband is not either. We'll reestablish connection with Dr. Mariah Milling as soon as possible. Home health referral for home care and [facility] referral  also underway.  Hyperlipidemia She did not present in a fasting state nor did she return after last visit as requested for repeat labs so her triglycerides were not repeated. Once home health is establish we'll ask them to draw a fasting lipid panel on her so we can treat.  Hypothyroidism Her Synthroid dose was reduced after last visit due to suppressed TSH but she did not return for repeat TSH. That is being drawn today.  Tobacco abuse She buys her own cigarettes. There is very little chance of getting this patient to quit smoking since she  suffers from dementia and does not remember my tirades even 5 minutes after speaking to her. Her daughter is supportive but has her own family to manage. Her husband is nonsupportive because he is disabled from his own mental illness. Her prognosis is poor .  Lichen sclerosus et atrophicus Suspected by vaginal exam today. Trial of clobetasol ointment to be applied daily for 3 weeks. Avoid  douches and to keep area dry between treatments.  Bronchitis, chronic obstructive Secondary to ongoing tobacco abuse. She has no signs of pneumonia or COPD exacerbation currently. Encouraged to quit smoking.   Updated Medication List Outpatient Encounter Prescriptions as of 05/20/2012  Medication Sig Dispense Refill  . aspirin 81 MG tablet Take 81 mg by mouth daily.        Marland Kitchen  Calcium & Magnesium Carbonates (MYLANTA PO) Take by mouth.        . carvedilol (COREG) 3.125 MG tablet Take 1 tablet (3.125 mg total) by mouth 2 (two) times daily.  60 tablet  3  . clopidogrel (PLAVIX) 75 MG tablet Take 1 tablet (75 mg total) by mouth daily.  90 tablet  3  . cyanocobalamin (,VITAMIN B-12,) 1000 MCG/ML injection Inject 1,000 mcg into the muscle once.      . CYMBALTA 60 MG capsule TAKE 1 CAPSULE BY MOUTH DAILY.  30 capsule  2  . donepezil (ARICEPT) 5 MG tablet Take 1 tablet (5 mg total) by mouth at bedtime as needed.  90 tablet  3  . enalapril (VASOTEC) 10 MG tablet Take 1 tablet (10 mg total) by mouth daily.  30 tablet  3  . HYDROcodone-acetaminophen (VICODIN) 5-500 MG per tablet TAKE 1 TABLET BY MOUTH 4 TIMES A DAY AS NEEDED FOR PAIN  120 tablet  1  . levothyroxine (SYNTHROID, LEVOTHROID) 125 MCG tablet Take 1 tablet (125 mcg total) by mouth daily.  90 tablet  3  . magnesium oxide (MAG-OX) 400 MG tablet Take 400 mg by mouth daily.        . metoprolol succinate (TOPROL-XL) 25 MG 24 hr tablet Take 1/2 tablet daily.      . nitroGLYCERIN (NITROSTAT) 0.4 MG SL tablet Place 0.4 mg under the tongue every 5 (five) minutes as  needed.        . pravastatin (PRAVACHOL) 80 MG tablet Take 1 tablet (80 mg total) by mouth every evening.  90 tablet  3  . traZODone (DESYREL) 100 MG tablet Take 1 tablet (100 mg total) by mouth at bedtime.  90 tablet  3  . DISCONTD: amitriptyline (ELAVIL) 150 MG tablet TAKE 1 HOUR BEFORE BEDTIME EVERY NIGHT FOR INSOMNIA  30 tablet  3  . chlorhexidine (PERIDEX) 0.12 % solution Swish 1 capful in mouth for 30 seconds twice daily after brushing teeth until gone  240 mL  1  . clobetasol ointment (TEMOVATE) 0.05 % Apply every night to vaginal folds  30 g  1  . zolpidem (AMBIEN) 5 MG tablet Take 2 tablets (10 mg total) by mouth at bedtime as needed for sleep.  30 tablet  1

## 2012-05-20 NOTE — Assessment & Plan Note (Addendum)
Suspected by vaginal exam today. Trial of clobetasol ointment to be applied daily for 3 weeks. Patient and family did not allow patient to douche and to keep area dry between treatments.

## 2012-05-20 NOTE — Telephone Encounter (Signed)
Patient advised via telephone, appt scheduled with Dr. Darrick Huntsman on 05/21/12 at 3:45.

## 2012-05-20 NOTE — Assessment & Plan Note (Signed)
Secondary to ongoing tobacco abuse. She has no signs of pneumonia or COPD exacerbation currently. Encouraged to quit smoking.

## 2012-05-20 NOTE — Patient Instructions (Addendum)
STOP SMOKING!!!   Stop the amitryptiline.  It is contributing to your dry mouth.  I will call in ambien to help you sleep.  You should drink 3 16 ounce bottles of water or flavored water daily,  And one or less sodas daily   DO NOT USE DOUCHES.  I will call in an ointment for you to apply to the outside of your vaginal nightly until your itching resolves (miniumum of 3 weeks)  I will call in a mouthwash for your gingivitis.  Please keep you appt with Dr. Mariah Milling when his office calls you  I am referring you to [facility] to help you at home. Gingivitis  Gingivitis is an infection of the teeth and bones that support the teeth. Your gums become red, sore, and puffy (swollen). It is caused by germs that build up on your teeth and gums (plaque). HOME CARE  Floss and then brush your teeth.   Brush at least twice a day.   Floss at least once a day.   Avoid sugar between meals.   Do not drink juice before bed. Only drink water.   Make and keep your regular checkups and cleanings with your dentist.   Use any mouth care product or toothpaste as told by your dentist.  GET HELP RIGHT AWAY IF:  You have painful, red tissue around your teeth.   You have trouble chewing.   You have loose or infected teeth.  MAKE SURE YOU:  Understand these instructions.   Will watch your condition.   Will get help right away if you are not doing well or get worse.  Document Released: 08/09/2010 Document Revised: 09/29/2011 Document Reviewed: 08/09/2010 [facility] Patient Information 2013 [place], [place].

## 2012-05-20 NOTE — Telephone Encounter (Signed)
BG extremely high at 642.   Has she taken medication for diabetes in the past? Given that she also has decline in kidney function and BG very high, would recommend that she return to clinic tomorrow and discuss starting Levemir or Lantus. Please instruct pt to monitor BG 2-3 times daily and record values. She MUST limit intake of sugared beverages as discussed with Dr. Darrick Huntsman at her visit.

## 2012-05-20 NOTE — Assessment & Plan Note (Signed)
She buys her own cigarettes. There is very little chance of getting this patient to quit smoking since she suffers from dementia and does not remember my tirades even 5 minutes after speaking to her. Her daughter is supportive but has her own family to manage. Her husband is nonsupportive because he is disabled from his own mental illness. Her prognosis is poor .

## 2012-05-20 NOTE — Progress Notes (Signed)
CALL REPORT [facility], TONYA: GLUCOSE [id number].  Printed and given to Dr. Walker. 

## 2012-05-20 NOTE — Assessment & Plan Note (Signed)
She did not present in a fasting state nor did she return after last visit as requested for repeat labs so her triglycerides were not repeated. Once home health is establish we'll ask them to draw a fasting lipid panel on her so we can treat.

## 2012-05-20 NOTE — Assessment & Plan Note (Signed)
Does not check sugars or follow low glyemic index diet.  hgba1c due. Not sure if medications are being taken correctly.

## 2012-05-20 NOTE — Progress Notes (Signed)
CALL REPORT [facility], TONYA: GLUCOSE 642.  Printed and given to Dr. Dan Humphreys.

## 2012-05-20 NOTE — Assessment & Plan Note (Signed)
Her Synthroid dose was reduced after last visit due to suppressed TSH but she did not return for repeat TSH. That is being drawn today.

## 2012-05-20 NOTE — Assessment & Plan Note (Addendum)
Secondary to dry mouth and poor oral hygiene with no dental follow up in over one  Year. Chlorhexidine rise twice daily and referral to [facility]  Oral surgery for evaluation.

## 2012-05-20 NOTE — Assessment & Plan Note (Signed)
  She is currently asymptomatic with regard to chest pain. Her symptoms of exertional dyspnea likely due to stage III of heart failure given her prior MI. at high risk for restenosis given her ongoing tobacco abuse. Per daughter she has been taking her medications as directed however daughter is now supervising and husband is not either. We'll reestablish connection with Dr. Thomasena Edis as soon as possible. Home health referral for home care and medication administration in order. Tried health network referral also underway.

## 2012-05-21 ENCOUNTER — Encounter: Payer: Self-pay | Admitting: Cardiovascular Disease

## 2012-05-21 ENCOUNTER — Telehealth: Payer: Self-pay | Admitting: Internal Medicine

## 2012-05-21 ENCOUNTER — Ambulatory Visit (INDEPENDENT_AMBULATORY_CARE_PROVIDER_SITE_OTHER): Payer: Medicare Other | Admitting: Cardiovascular Disease

## 2012-05-21 ENCOUNTER — Ambulatory Visit: Payer: Medicare Other | Admitting: Internal Medicine

## 2012-05-21 VITALS — BP 128/82 | HR 81 | Ht 66.0 in | Wt 151.8 lb

## 2012-05-21 DIAGNOSIS — I5022 Chronic systolic (congestive) heart failure: Secondary | ICD-10-CM | POA: Diagnosis not present

## 2012-05-21 DIAGNOSIS — I509 Heart failure, unspecified: Secondary | ICD-10-CM

## 2012-05-21 DIAGNOSIS — R0602 Shortness of breath: Secondary | ICD-10-CM | POA: Diagnosis not present

## 2012-05-21 DIAGNOSIS — I2510 Atherosclerotic heart disease of native coronary artery without angina pectoris: Secondary | ICD-10-CM | POA: Diagnosis not present

## 2012-05-21 MED ORDER — METFORMIN HCL 500 MG PO TABS: ORAL_TABLET | ORAL | Status: DC

## 2012-05-21 MED ORDER — FREESTYLE SYSTEM KIT: 1.0000 | PACK | Status: DC | PRN

## 2012-05-21 MED ORDER — GLIPIZIDE 10 MG PO TABS: ORAL_TABLET | ORAL | Status: DC

## 2012-05-21 MED ORDER — CARVEDILOL 6.25 MG PO TABS: 6.2500 mg | ORAL_TABLET | Freq: Two times a day (BID) | ORAL | Status: DC

## 2012-05-21 NOTE — Telephone Encounter (Signed)
I have no idea.   i have never referred to a periodontist.  Any in [place]?

## 2012-05-21 NOTE — Telephone Encounter (Signed)
[facility] will not see this patient for gingivitis they only extract teeth.  She will need to go to a periodontist  Who would you like me to call

## 2012-05-21 NOTE — Assessment & Plan Note (Signed)
Sarah Payne presents today for further evaluation. She's been having more shortness breath but she also has been having many issues with hypoglycemia and continued cigarette smoking. Her glucose levels yesterday were above 600.  She is not having episodes of angina.  I did notice that she's on metoprolol and carvedilol. We'll discontinue the metoprolol and start her on carvedilol 6.25 mg twice a day.  I've recommended that she stop smoking. I've recommended she get any of the nicotine replacement products which I think will be better for cigarette smoking. I suspect that she has some degree of COPD.  She will follow up with Dr. Mariah Milling in 2-3 months.

## 2012-05-21 NOTE — Assessment & Plan Note (Addendum)
There has been some comment that Sarah Payne had chronic systolic congestive heart failure. Her husband thought that her ejection fraction was around 25. He remembers hearing that number.  We'll schedule her for an echocardiogram for further evaluation. She is already on enalapril and carvedilol. She'll followup with Dr. Mariah Milling.

## 2012-05-21 NOTE — Patient Instructions (Addendum)
Your physician has requested that you have an echocardiogram. Echocardiography is a painless test that uses sound waves to create images of your heart. It provides your doctor with information about the size and shape of your heart and how well your heart's chambers and valves are working. This procedure takes approximately one hour. There are no restrictions for this procedure.  Your physician has recommended you make the following change in your medication:  -stop metoprolol -increase coreg to 6.25 mg twice daily  Your physician has requested that you have an echocardiogram. Echocardiography is a painless test that uses sound waves to create images of your heart. It provides your doctor with information about the size and shape of your heart and how well your heart's chambers and valves are working. This procedure takes approximately one hour. There are no restrictions for this procedure.  Your physician wants you to follow-up in: 2-3 months with Dr. Mariah Milling. You will receive a reminder letter in the mail two months in advance. If you don't receive a letter, please call our office to schedule the follow-up appointment.

## 2012-05-21 NOTE — Telephone Encounter (Signed)
Patient was treated in ER with 2 L IV fluids and sent home but was told by me prior to sending to Er that she did not need to keep her appt today. Not currently taking any meds for diabetes.  Will resuem metformin 500 mg bid and glipizide 10 mg bid.  rtx for these and new glucometer

## 2012-05-21 NOTE — Progress Notes (Signed)
Sarah Payne Date of Birth  24-Feb-1946       [facility]    [place] [address]  [address] [place], [place]  [address]   [place], [place]  [address] [phone]     [phone]   Fax  [phone]    Fax [phone]  Problem List: 1. CAD - s/p stenting of LCx and RCA 2. Diabetes Mellitus - gucose is poorly controlled. 3.  dementia 4. HTN 5. Hypothyroidism 6. hyperlipidemia  History of Present Illness:  Pt has not been back to see Dr. Mariah Milling in a while.  She was seen yesterday by Dr. Darrick Huntsman.  She was found to have markedly elevated glucose level. She was also found to be hypothyroid with a TSH level of 13. She was instructed to go to the ER. She went to the emergency room and received 2 L of normal saline his medicine. Her glucose levels improved to 350 and she was discharged home.    Current Outpatient Prescriptions on File Prior to Visit  Medication Sig Dispense Refill  . aspirin 81 MG tablet Take 81 mg by mouth daily.        . Calcium & Magnesium Carbonates (MYLANTA PO) Take by mouth.        . carvedilol (COREG) 3.125 MG tablet Take 1 tablet (3.125 mg total) by mouth 2 (two) times daily.  60 tablet  3  . chlorhexidine (PERIDEX) 0.12 % solution Swish 1 capful in mouth for 30 seconds twice daily after brushing teeth until gone  240 mL  1  . clobetasol ointment (TEMOVATE) 0.05 % Apply every night to vaginal folds  30 g  1  . clopidogrel (PLAVIX) 75 MG tablet Take 1 tablet (75 mg total) by mouth daily.  90 tablet  3  . cyanocobalamin (,VITAMIN B-12,) 1000 MCG/ML injection Inject 1,000 mcg into the muscle once.      . CYMBALTA 60 MG capsule TAKE 1 CAPSULE BY MOUTH DAILY.  30 capsule  2  . donepezil (ARICEPT) 5 MG tablet Take 1 tablet (5 mg total) by mouth at bedtime as needed.  90 tablet  3  . enalapril (VASOTEC) 10 MG tablet Take 1 tablet (10 mg total) by mouth daily.  30 tablet  3  . HYDROcodone-acetaminophen (VICODIN) 5-500 MG  per tablet TAKE 1 TABLET BY MOUTH 4 TIMES A DAY AS NEEDED FOR PAIN  120 tablet  1  . levothyroxine (SYNTHROID, LEVOTHROID) 125 MCG tablet Take 1 tablet (125 mcg total) by mouth daily.  90 tablet  3  . magnesium oxide (MAG-OX) 400 MG tablet Take 400 mg by mouth daily.        . metoprolol succinate (TOPROL-XL) 25 MG 24 hr tablet Take 1/2 tablet daily.      . nitroGLYCERIN (NITROSTAT) 0.4 MG SL tablet Place 0.4 mg under the tongue every 5 (five) minutes as needed.        . pravastatin (PRAVACHOL) 80 MG tablet Take 1 tablet (80 mg total) by mouth every evening.  90 tablet  3  . traZODone (DESYREL) 100 MG tablet Take 1 tablet (100 mg total) by mouth at bedtime.  90 tablet  3  . zolpidem (AMBIEN) 5 MG tablet Take 2 tablets (10 mg total) by mouth at bedtime as needed for sleep.  30 tablet  1  . DISCONTD: metFORMIN (GLUMETZA) 1000 MG (MOD) 24 hr tablet Take 1,000 mg by mouth 2 (two) times daily with  a meal.          No Known Allergies  Past Medical History  Diagnosis Date  . Diabetes mellitus   . Hypertension   . Hypothyroidism   . Depression   . Vascular dementia   . Coronary artery disease     a. s/p PCI/DES LCX x 3;  b. NSTEMI 09/16/11;  c.   09/17/11 Cath [facility]) Severe RCA/LCX dzs - RCA treated, LCX intervention pending w/ Dr. Juliann Pares  . Tobacco abuse     a. 45 pack year history - quit 09/16/11  . Chronic low back pain     Past Surgical History  Procedure Date  . Back surgery   . Abdominal hysterectomy   . Appendectomy   . Cardiac catheterization   . Coronary angioplasty 2012    stent x 3     History  Smoking status  . Current Every Day Smoker -- 0.2 packs/day  . Types: Cigarettes  Smokeless tobacco  . Never Used  Comment: Has cut back, trying to quit.    History  Alcohol Use No    Family History  Problem Relation Age of Onset  . Heart attack Mother     First MI @ 54 - Died @ 24  . Heart disease Mother   . Heart disease Father     Died @ 61  . Throat cancer Brother      Reviw of Systems:  Reviewed in the HPI.  All other systems are negative.  Physical Exam: Blood pressure 128/82, pulse 81, height 5\' 6"  (1.676 m), weight 151 lb 12 oz (68.833 kg). General: Well developed, well nourished, in no acute distress.  Head: Normocephalic, atraumatic, sclera non-icteric, mucus membranes are moist,   Neck: Supple. Carotids are 2 + without bruits. No JVD  Lungs: Clear bilaterally to auscultation.  Heart: regular rate.  normal  S1 S2. No murmurs, gallops or rubs.  Abdomen: Soft, non-tender, non-distended with normal bowel sounds. No hepatomegaly. No rebound/guarding. No masses.  Msk:  Strength and tone are normal  Extremities: No clubbing or cyanosis. No edema.  Distal pedal pulses are 2+ and equal bilaterally.  Neuro: Alert and oriented X 3. Moves all extremities spontaneously.  Psych:  Responds to questions appropriately with a normal affect.  ECG: Nov. 1, 2013-normal sinus rhythm at 81 beats a minute. She has a first-degree AV block. She has poor R-wave progression is likely due to lead placement.  Assessment / Plan:

## 2012-05-25 ENCOUNTER — Other Ambulatory Visit: Payer: Medicare Other

## 2012-05-25 DIAGNOSIS — R0989 Other specified symptoms and signs involving the circulatory and respiratory systems: Secondary | ICD-10-CM

## 2012-06-02 ENCOUNTER — Telehealth: Payer: Self-pay

## 2012-06-02 MED ORDER — FREESTYLE SYSTEM KIT: 1.0000 | PACK | Status: DC | PRN

## 2012-06-02 NOTE — Telephone Encounter (Signed)
Patient is needing a new rx for meter she stated that the pharmacy do not have the one that you ordered.

## 2012-06-02 NOTE — Telephone Encounter (Signed)
Sent!

## 2012-06-07 ENCOUNTER — Telehealth: Payer: Self-pay | Admitting: Internal Medicine

## 2012-06-07 NOTE — Telephone Encounter (Signed)
Please call in test stripfor her meter.  #6-0/month with 11 refills   Directions,  Check blood sugars 2 times daily .

## 2012-06-07 NOTE — Telephone Encounter (Signed)
Pt came in today  Stating dr Darrick Huntsman called in glucose meter but not the test strips Pt needs test strips called into   [facility]

## 2012-06-09 ENCOUNTER — Other Ambulatory Visit: Payer: Self-pay

## 2012-06-09 ENCOUNTER — Telehealth: Payer: Self-pay

## 2012-06-09 NOTE — Telephone Encounter (Signed)
Patient request Test strips for Freestyle Glucose test strips. Could you please give directions and quantities so I can call it in to pharmacy.

## 2012-06-10 ENCOUNTER — Other Ambulatory Visit: Payer: Self-pay

## 2012-06-10 MED ORDER — GLUCOSE BLOOD VI STRP: ORAL_STRIP | Status: DC

## 2012-06-10 NOTE — Telephone Encounter (Signed)
Glucose blood test strips # [id number] R sent electronic to [facility]

## 2012-06-11 ENCOUNTER — Encounter: Payer: Self-pay | Admitting: Cardiovascular Disease

## 2012-06-16 ENCOUNTER — Other Ambulatory Visit: Payer: Self-pay

## 2012-06-16 ENCOUNTER — Other Ambulatory Visit: Payer: Self-pay | Admitting: Internal Medicine

## 2012-06-16 MED ORDER — DULOXETINE HCL 60 MG PO CPEP: 60.0000 mg | ORAL_CAPSULE | Freq: Every day | ORAL | Status: DC

## 2012-06-16 NOTE — Telephone Encounter (Signed)
Refill request for Cymbalta 60 mg. Ok to refill? 

## 2012-06-30 ENCOUNTER — Encounter: Payer: Self-pay | Admitting: Internal Medicine

## 2012-06-30 ENCOUNTER — Ambulatory Visit (INDEPENDENT_AMBULATORY_CARE_PROVIDER_SITE_OTHER): Payer: Medicare Other | Admitting: Internal Medicine

## 2012-06-30 VITALS — BP 126/82 | HR 81 | Temp 98.5°F | Resp 12 | Ht 67.0 in | Wt 149.2 lb

## 2012-06-30 DIAGNOSIS — I739 Peripheral vascular disease, unspecified: Secondary | ICD-10-CM

## 2012-06-30 DIAGNOSIS — M542 Cervicalgia: Secondary | ICD-10-CM

## 2012-06-30 DIAGNOSIS — R112 Nausea with vomiting, unspecified: Secondary | ICD-10-CM

## 2012-06-30 DIAGNOSIS — E1151 Type 2 diabetes mellitus with diabetic peripheral angiopathy without gangrene: Secondary | ICD-10-CM

## 2012-06-30 DIAGNOSIS — E119 Type 2 diabetes mellitus without complications: Secondary | ICD-10-CM

## 2012-06-30 DIAGNOSIS — F32A Depression, unspecified: Secondary | ICD-10-CM

## 2012-06-30 DIAGNOSIS — R51 Headache: Secondary | ICD-10-CM

## 2012-06-30 DIAGNOSIS — Z720 Tobacco use: Secondary | ICD-10-CM

## 2012-06-30 DIAGNOSIS — M4722 Other spondylosis with radiculopathy, cervical region: Secondary | ICD-10-CM

## 2012-06-30 DIAGNOSIS — F329 Major depressive disorder, single episode, unspecified: Secondary | ICD-10-CM

## 2012-06-30 DIAGNOSIS — E1159 Type 2 diabetes mellitus with other circulatory complications: Secondary | ICD-10-CM

## 2012-06-30 DIAGNOSIS — M5412 Radiculopathy, cervical region: Secondary | ICD-10-CM

## 2012-06-30 DIAGNOSIS — F17200 Nicotine dependence, unspecified, uncomplicated: Secondary | ICD-10-CM

## 2012-06-30 MED ORDER — ONDANSETRON 8 MG PO TBDP: 8.0000 mg | ORAL_TABLET | Freq: Three times a day (TID) | ORAL | Status: DC | PRN

## 2012-06-30 MED ORDER — HYDROCODONE-ACETAMINOPHEN 10-325 MG PO TABS: 1.0000 | ORAL_TABLET | Freq: Three times a day (TID) | ORAL | Status: DC | PRN

## 2012-06-30 NOTE — Progress Notes (Signed)
Patient ID: Sarah Payne, female   DOB: 17-Sep-1945, 66 y.o.   MRN: [id number]  Patient Active Problem List  Diagnosis  . CAD, multiple vessel  . Tobacco abuse  . Hyperlipidemia  . Hypothyroidism  . Depression  . 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 Diabetes mellitus type 2 with peripheral artery disease  . Angina at rest  . Headache  . Lichen sclerosus et atrophicus  . Bronchitis, chronic obstructive  . Chronic systolic congestive heart failure    Subjective:  CC:   Chief Complaint  Patient presents with  . Emesis    HPI:   ADRINA Payne a 66 y.o. female who presents with multiple issues.  Last seen 6 weeks ago with gingitivitis and loss to cardiology followup.   1) Recurrent nausea and occasional vomiting. The daily nausea has been occurring for the past month. It is present when she wakes up .  She has relief only when she is eating,  After 30 minutes it begins to hurt again.  She hs no  change in stools.   Has had several episodes of vomiting of digested food .  No coffee grounds. She has no abdominal pain but noted some muscle spasm under both ribs last week. 2) She has a daily headache in the occipital area and bilateral shoulder pain which is also constant.   Not relieved with current use of vicodin once or twice daily. 3) Diabetes followup.  Her blood sugars running 130 to 180 on metformin and glipizide.    Past Medical History  Diagnosis Date  . Diabetes mellitus   . Hypertension   . Hypothyroidism   . Depression   . Vascular dementia   . Coronary artery disease     a. s/p PCI/DES LCX x 3;  b. NSTEMI 09/16/11;  c.   09/17/11 Cath [facility]) Severe RCA/LCX dzs - RCA treated, LCX intervention pending w/ Dr. Juliann Payne  . Tobacco abuse     a. 45 pack year history - quit 09/16/11  . Chronic low back pain     Past Surgical History  Procedure Date  . Back surgery   . Abdominal hysterectomy   . Appendectomy   . Cardiac catheterization   . Coronary  angioplasty 2012    stent x 3      The following portions of the patient's history were reviewed and updated as appropriate: Allergies, current medications, and problem list.    Review of Systems:   12 Pt  review of systems was negative except those addressed in the HPI,     History   Social History  . Marital Status: Married    Spouse Name: N/A    Number of Children: N/A  . Years of Education: N/A   Occupational History  . Not on file.   Social History Main Topics  . Smoking status: Current Every Day Smoker -- 0.2 packs/day    Types: Cigarettes  . Smokeless tobacco: Never Used     Comment: Has cut back, trying to quit.  . Alcohol Use: No  . Drug Use: No  . Sexually Active: Not on file   Other Topics Concern  . Not on file   Social History Narrative   Lives at home with her husband in [place].  Previously used marijuana - quit.    Objective:  BP 126/82  Pulse 81  Temp 98.5 F (36.9 C) (Oral)  Resp 12  Ht 5\' 7"  (1.702 m)  Wt 149 lb  4 oz (67.699 kg)  BMI 23.38 kg/m2  SpO2 96%  General appearance: alert, cooperative and appears stated age Ears: normal TM's and external ear canals both ears Throat: lips, mucosa, and tongue normal; teeth and gums normal Neck: no adenopathy, no carotid bruit, supple, symmetrical, trachea midline and thyroid not enlarged, symmetric, no tenderness/mass/nodules Back: symmetric, no curvature. ROM normal. No CVA tenderness. Lungs: clear to auscultation bilaterally Heart: regular rate and rhythm, S1, S2 normal, no murmur, click, rub or gallop Abdomen: soft, non-tender; bowel sounds normal; no masses,  no organomegaly Pulses: 2+ and symmetric Skin: Skin color, texture, turgor normal. No rashes or lesions Lymph nodes: Cervical, supraclavicular, and axillary nodes normal.  Assessment and Plan:  Diabetes mellitus type 2 with peripheral artery disease Her DM is uncontrolled despite reports of blood usgars in the 130 to 180  range by husband. hgba1c is now 14. Will start twice daily insulin if there is a responsible family member who can monitor its use.  Metformin stopped due to current history of nausea and vomiting. Januvia samples given to patient to use in the interim .  LDl was 81 on current statin dose several months ago. Reminder for eye exam given.   Depression Currently not controlled due to persistent pain,  Cymbalta dose is maximal.  Patient very pessimistic abut her long term prognosis BC of health issues,  But is not suicidal.   Headache I suspect that her daily headache and shoulder pain are referred from cervical disk disease.  vicodin increased,  tylenol dose reduced, and cervical spine MRI ordered.   Tobacco abuse Spent 3 minutes discussing risk of continued tobacco abuse, including but not limited to CAD, PAD, hypertension, and CA.  She is not interested in pharmacotherapy at this time.  Nausea and vomiting in adult Lipase, H Pylori and CMET normal. Etiology may be gastritis vs gastroparesis vs medication side effect.  metformin stopped, ranitidine twice daily started (given concern for interaction with concurrent use of  PPI and Plavix  and lack of close supervision of medication administration ).  Return n 4 weeks, and if no improvement with changes made will initiate PPI therapy and obtain gastric emptying study.   Updated Medication List Outpatient Encounter Prescriptions as of 06/30/2012  Medication Sig Dispense Refill  . aspirin 81 MG tablet Take 81 mg by mouth daily.        . Calcium & Magnesium Carbonates (MYLANTA PO) Take by mouth.        . carvedilol (COREG) 6.25 MG tablet Take 1 tablet (6.25 mg total) by mouth 2 (two) times daily.  180 tablet  3  . chlorhexidine (PERIDEX) 0.12 % solution Swish 1 capful in mouth for 30 seconds twice daily after brushing teeth until gone  240 mL  1  . clobetasol ointment (TEMOVATE) 0.05 % Apply every night to vaginal folds  30 g  1  . clopidogrel  (PLAVIX) 75 MG tablet Take 1 tablet (75 mg total) by mouth daily.  90 tablet  3  . cyanocobalamin (,VITAMIN B-12,) 1000 MCG/ML injection Inject 1,000 mcg into the muscle once.      . donepezil (ARICEPT) 5 MG tablet Take 1 tablet (5 mg total) by mouth at bedtime as needed.  90 tablet  3  . DULoxetine (CYMBALTA) 60 MG capsule Take 1 capsule (60 mg total) by mouth daily.  30 capsule  2  . enalapril (VASOTEC) 10 MG tablet Take 1 tablet (10 mg total) by mouth daily.  30 tablet  3  . glipiZIDE (GLUCOTROL) 10 MG tablet Before breakfast and supper (evening meal)  60 tablet  3  . glucose blood (FREESTYLE LITE) test strip Check blood sugar 2 times daily.  60 each  11  . glucose monitoring kit (FREESTYLE) monitoring kit 1 each by Does not apply route as needed for other. PHARMACY PLEASE SUBSTITUTE WHATEVER GLUCOMETER YOU CARRY  1 each  1  . levothyroxine (SYNTHROID, LEVOTHROID) 125 MCG tablet Take 1 tablet (125 mcg total) by mouth daily.  90 tablet  3  . magnesium oxide (MAG-OX) 400 MG tablet Take 400 mg by mouth daily.        . nitroGLYCERIN (NITROSTAT) 0.4 MG SL tablet Place 0.4 mg under the tongue every 5 (five) minutes as needed.        . pravastatin (PRAVACHOL) 80 MG tablet Take 1 tablet (80 mg total) by mouth every evening.  90 tablet  3  . traZODone (DESYREL) 100 MG tablet Take 1 tablet (100 mg total) by mouth at bedtime.  90 tablet  3  . zolpidem (AMBIEN) 5 MG tablet Take 2 tablets (10 mg total) by mouth at bedtime as needed for sleep.  30 tablet  1  . [DISCONTINUED] HYDROcodone-acetaminophen (VICODIN) 5-500 MG per tablet TAKE 1 TABLET BY MOUTH 4 TIMES A DAY AS NEEDED FOR PAIN  120 tablet  1  . [DISCONTINUED] metFORMIN (GLUCOPHAGE) 500 MG tablet Before breakfast and supper  180 tablet  3  . HYDROcodone-acetaminophen (NORCO) 10-325 MG per tablet Take 1 tablet by mouth every 8 (eight) hours as needed for pain.  90 tablet  1  . ondansetron (ZOFRAN-ODT) 8 MG disintegrating tablet Take 1 tablet (8 mg total)  by mouth every 8 (eight) hours as needed for nausea.  20 tablet  0  . sitaGLIPtin (JANUVIA) 100 MG tablet Take 1 tablet (100 mg total) by mouth daily.  30 tablet  11

## 2012-06-30 NOTE — Patient Instructions (Addendum)
We will check labs today to rule out liver and kidney casues of nausea  Stop the metformin and your ibuprofen  to see if the nausea resolves  Take the ranitidine  twice daily with breakfast and dinner for your stomach.  If your blood sugars without the metformin start to stay above 160,  Start the samples of Januvia.  One tablet daily   Am ordering an MRI of the cervical spine to investigate your headaches   I have increased the vicodin strength,  You can take it up to 3 times daily

## 2012-07-01 LAB — HEMOGLOBIN A1C: Hgb A1c MFr Bld: 14.2 % — ABNORMAL HIGH (ref 4.6–6.5)

## 2012-07-01 LAB — COMPREHENSIVE METABOLIC PANEL
ALT: 18 U/L (ref 0–35)
AST: 20 U/L (ref 0–37)
Albumin: 4.4 g/dL (ref 3.5–5.2)
Alkaline Phosphatase: 60 U/L (ref 39–117)
BUN: 20 mg/dL (ref 6–23)
CO2: 27 mEq/L (ref 19–32)
Calcium: 9.6 mg/dL (ref 8.4–10.5)
Chloride: 101 mEq/L (ref 96–112)
Creatinine, Ser: 1.1 mg/dL (ref 0.4–1.2)
GFR: 53.66 mL/min — ABNORMAL LOW (ref >60.00)
Glucose, Bld: 134 mg/dL — ABNORMAL HIGH (ref 70–99)
Potassium: 3.9 mEq/L (ref 3.5–5.1)
Sodium: 136 mEq/L (ref 135–145)
Total Bilirubin: 0.5 mg/dL (ref 0.3–1.2)
Total Protein: 7.6 g/dL (ref 6.0–8.3)

## 2012-07-01 LAB — LIPASE: Lipase: 31 U/L (ref 11.0–59.0)

## 2012-07-02 LAB — H. PYLORI ANTIBODY, IGG: H Pylori IgG: NEGATIVE

## 2012-07-03 ENCOUNTER — Telehealth: Payer: Self-pay | Admitting: Internal Medicine

## 2012-07-03 ENCOUNTER — Encounter: Payer: Self-pay | Admitting: Internal Medicine

## 2012-07-03 DIAGNOSIS — R112 Nausea with vomiting, unspecified: Secondary | ICD-10-CM | POA: Insufficient documentation

## 2012-07-03 MED ORDER — SITAGLIPTIN PHOSPHATE 100 MG PO TABS: 100.0000 mg | ORAL_TABLET | Freq: Every day | ORAL | Status: DC

## 2012-07-03 NOTE — Assessment & Plan Note (Signed)
Her DM is uncontrolled despite reports of blood usgars in the 130 to 180 range by husband. hgba1c is now 14. Will start twice daily insulin if there is a responsible family member who can monitor its use.  Metformin stopped due to current history of nausea and vomiting. Januvia samples given to patient to use in the interim .  LDl was 81 on current statin dose several months ago. Reminder for eye exam given.

## 2012-07-03 NOTE — Assessment & Plan Note (Signed)
Spent 3 minutes discussing risk of continued tobacco abuse, including but not limited to CAD, PAD, hypertension, and CA.  She is not interested in pharmacotherapy at this time. 

## 2012-07-03 NOTE — Telephone Encounter (Signed)
Her labs were normal except her a1c was sky high at 14, which  indicates thatthe blood sugar reading they are getting at home are not accurate ore reflective of her control. . Please ask her husband to check her sugars twice daily until she returns in 4 weeks and the start taking the Januvia samples I gave him for her .  She will  need to start insulin  When she returns so make sure the family figures out who is going ot help her give it to herself,

## 2012-07-03 NOTE — Assessment & Plan Note (Addendum)
I suspect that her daily headache and shoulder pain are referred from cervical disk disease.  vicodin increased,  tylenol dose reduced, and cervical spine MRI ordered.

## 2012-07-03 NOTE — Assessment & Plan Note (Addendum)
Lipase, H Pylori and CMET normal. Etiology may be gastritis vs gastroparesis vs medication side effect.  metformin stopped, ranitidine twice daily started (given concern for interaction with concurrent use of  PPI and Plavix  and lack of close supervision of medication administration ).  Return n 4 weeks, and if no improvement with changes made will initiate PPI therapy and obtain gastric emptying study.

## 2012-07-03 NOTE — Assessment & Plan Note (Addendum)
Currently not controlled due to persistent pain,  Cymbalta dose is maximal.  Patient very pessimistic abut her long term prognosis BC of health issues,  But is not suicidal.

## 2012-07-05 NOTE — Telephone Encounter (Signed)
Left detailed message on patient vm with lab results and instructions. 

## 2012-07-09 ENCOUNTER — Ambulatory Visit (HOSPITAL_COMMUNITY)
Admission: RE | Admit: 2012-07-09 | Discharge: 2012-07-09 | Disposition: A | Discharge status: Home / Self Care | Payer: Medicare Other | Source: Ambulatory Visit | Attending: Internal Medicine | Admitting: Internal Medicine

## 2012-07-09 DIAGNOSIS — M5020 Other cervical disc displacement, unspecified cervical region: Secondary | ICD-10-CM | POA: Diagnosis not present

## 2012-07-09 DIAGNOSIS — M47812 Spondylosis without myelopathy or radiculopathy, cervical region: Secondary | ICD-10-CM | POA: Diagnosis not present

## 2012-07-09 DIAGNOSIS — R51 Headache: Secondary | ICD-10-CM | POA: Diagnosis not present

## 2012-07-09 DIAGNOSIS — M542 Cervicalgia: Secondary | ICD-10-CM | POA: Diagnosis not present

## 2012-07-09 DIAGNOSIS — R209 Unspecified disturbances of skin sensation: Secondary | ICD-10-CM | POA: Diagnosis not present

## 2012-07-09 DIAGNOSIS — M4722 Other spondylosis with radiculopathy, cervical region: Secondary | ICD-10-CM

## 2012-07-09 DIAGNOSIS — M5030 Other cervical disc degeneration, unspecified cervical region: Secondary | ICD-10-CM | POA: Diagnosis not present

## 2012-07-09 DIAGNOSIS — M5412 Radiculopathy, cervical region: Secondary | ICD-10-CM | POA: Diagnosis not present

## 2012-07-11 ENCOUNTER — Other Ambulatory Visit: Payer: Self-pay | Admitting: Internal Medicine

## 2012-07-11 DIAGNOSIS — M542 Cervicalgia: Secondary | ICD-10-CM

## 2012-07-19 ENCOUNTER — Telehealth: Payer: Self-pay | Admitting: Internal Medicine

## 2012-07-19 NOTE — Telephone Encounter (Signed)
Patient Information:  Caller Name: Oriana  Phone: [phone]  Patient: Sarah Payne, Sarah Payne  Gender: Female  DOB: 1945-09-16  Age: 66 Years  PCP: Duncan Dull (Adults only)  Office Follow Up:  Does the office need to follow up with this patient?: No  Instructions For The Office: N/A  RN Note:  Advised 911  Symptoms  Reason For Call & Symptoms: Having chest pain that is radiating into her back.  "I am sure that it is my heart that is hurting."   Advised 911.  Reviewed Health History In EMR: N/A  Reviewed Medications In EMR: N/A  Reviewed Allergies In EMR: N/A  Reviewed Surgeries / Procedures: N/A  Date of Onset of Symptoms: 07/18/2012  Guideline(s) Used:  Chest Pain  Disposition Per Guideline:   Call EMS 911 Now  Reason For Disposition Reached:   Chest pain lasting longer than 5 minutes and ANY of the following:  Over 33 years old Over 26 years old and at least one cardiac risk factor (i.e., high blood pressure, diabetes, high cholesterol, obesity, smoker or strong family history of heart disease) Pain is crushing, pressure-like, or heavy  Took nitroglycerin and chest pain was not relieved History of heart disease (i.e., angina, heart attack, bypass surgery, angioplasty, CHF)  Advice Given:  N/A

## 2012-07-27 ENCOUNTER — Ambulatory Visit (INDEPENDENT_AMBULATORY_CARE_PROVIDER_SITE_OTHER): Payer: Medicare Other | Admitting: Internal Medicine

## 2012-07-27 ENCOUNTER — Encounter: Payer: Self-pay | Admitting: Internal Medicine

## 2012-07-27 VITALS — BP 130/84 | HR 73 | Temp 97.7°F | Resp 16 | Wt 146.5 lb

## 2012-07-27 DIAGNOSIS — I208 Other forms of angina pectoris: Secondary | ICD-10-CM

## 2012-07-27 DIAGNOSIS — I2089 Other forms of angina pectoris: Secondary | ICD-10-CM

## 2012-07-27 DIAGNOSIS — I739 Peripheral vascular disease, unspecified: Secondary | ICD-10-CM | POA: Diagnosis not present

## 2012-07-27 DIAGNOSIS — E1159 Type 2 diabetes mellitus with other circulatory complications: Secondary | ICD-10-CM | POA: Diagnosis not present

## 2012-07-27 DIAGNOSIS — E1151 Type 2 diabetes mellitus with diabetic peripheral angiopathy without gangrene: Secondary | ICD-10-CM

## 2012-07-27 DIAGNOSIS — L900 Lichen sclerosus et atrophicus: Secondary | ICD-10-CM

## 2012-07-27 DIAGNOSIS — I209 Angina pectoris, unspecified: Secondary | ICD-10-CM | POA: Diagnosis not present

## 2012-07-27 DIAGNOSIS — L940 Localized scleroderma [morphea]: Secondary | ICD-10-CM

## 2012-07-27 MED ORDER — INSULIN DETEMIR 100 UNIT/ML ~~LOC~~ SOLN: 20.0000 [IU] | Freq: Every day | SUBCUTANEOUS | Status: DC

## 2012-07-27 MED ORDER — CLOBETASOL PROPIONATE 0.05 % EX OINT: TOPICAL_OINTMENT | CUTANEOUS | Status: DC

## 2012-07-27 NOTE — Patient Instructions (Addendum)
Please resume your glipizide and Januvia.  You need both medications along with a daily shot of Insulin to get your diabetes under control  Please make an appt to return with your husband  to see my Nurse any afternoon she is available for a teaching session so Rookie can get comfortable giving you the shot  Pick up the pen from your pharmacy first!!!!  Let us know which mail order pharmacy your insurance wants [place] to use for your 90 day refills on everything   appt with Dr Mariah Milling to be scheduled

## 2012-07-27 NOTE — Progress Notes (Signed)
Patient ID: Sarah Payne, female   DOB: Aug 29, 1945, 67 y.o.   MRN: [id number]  Patient Active Problem List  Diagnosis  . CAD, multiple vessel  . Tobacco abuse  . Hyperlipidemia  . Hypothyroidism  . Depression  . 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 Diabetes mellitus type 2 with peripheral artery disease  . Angina at rest  . Headache  . Lichen sclerosus et atrophicus  . Bronchitis, chronic obstructive  . Chronic systolic congestive heart failure  . Nausea and vomiting in adult    Subjective:  CC:   Chief Complaint  Patient presents with  . Diabetes    HPI:   Sarah Payne a 67 y.o. female who presents for follow up on 1) uncontrolled diabetes.  Her  hgba1c 14.2  Last week.  Blood sugars running 200 to 300 all the time despite using [place] started several weeks ago;  however she  stopped the glipizide for unclear reasons.   Her daughter  fills her pill box every 2 weeks. Although she missed sunday's meds.  States that husband is willing to give her a daily injection. 2) chest pain   Refused to go to ER on Dec 30th for chest pain bc she did not want to sit in th ER. .  Husband has PTSD and cannot sit in the ER bc he is having a difficult time won't see his MD.  Patient is having recurrent chest pain with minimal exertion for several weeks accompanied by episodes of AMS  brought on by exertion in the house. She denies headache and vertigo but feels off balance when it happens, and although she's had no falls she has walked into a wall a few times.  Has no energy to do household activities and receives no help from  her 66 yr old son or  herhusband who works and goes to school.   3) vaginitis. Needs refill on clobetasol.    Past Medical History  Diagnosis Date  . Diabetes mellitus   . Hypertension   . Hypothyroidism   . Depression   . Vascular dementia   . Coronary artery disease     a. s/p PCI/DES LCX x 3;  b. NSTEMI 09/16/11;  c.   09/17/11 Cath [facility]) Severe  RCA/LCX dzs - RCA treated, LCX intervention pending w/ Dr. Juliann Pares  . Tobacco abuse     a. 45 pack year history - quit 09/16/11  . Chronic low back pain     Past Surgical History  Procedure Date  . Back surgery   . Abdominal hysterectomy   . Appendectomy   . Cardiac catheterization   . Coronary angioplasty 2012    stent x 3    The following portions of the patient's history were reviewed and updated as appropriate: Allergies, current medications, and problem list.  Review of Systems:  Patient denies headache, fevers, malaise, unintentional weight loss, skin rash, eye pain, sinus congestion and sinus pain, sore throat, dysphagia,  hemoptysis , cough, dyspnea, wheezing, , palpitations, orthopnea, edema, abdominal pain, nausea, melena, diarrhea, constipation, flank pain, dysuria, hematuria, urinary  Frequency, nocturia, numbness, tingling, seizures,  Focal weakness, Loss of consciousness,  Tremor, insomnia, depression, anxiety, and suicidal ideation.       History   Social History  . Marital Status: Married    Spouse Name: N/A    Number of Children: N/A  . Years of Education: N/A   Occupational History  . Not on file.   Social  History Main Topics  . Smoking status: Current Every Day Smoker -- 0.2 packs/day    Types: Cigarettes  . Smokeless tobacco: Never Used     Comment: Has cut back, trying to quit.  . Alcohol Use: No  . Drug Use: No  . Sexually Active: Not on file   Other Topics Concern  . Not on file   Social History Narrative   Lives at home with her husband in [place].  Previously used marijuana - quit.    Objective:  BP 130/84  Pulse 73  Temp 97.7 F (36.5 C) (Oral)  Resp 16  Wt 146 lb 8 oz (66.452 kg)  SpO2 98%  General appearance: alert, cooperative and appears stated age Ears: normal TM's and external ear canals both ears Throat: lips, mucosa, and tongue normal; teeth and gums normal Neck: no adenopathy, no carotid bruit, supple, symmetrical,  trachea midline and thyroid not enlarged, symmetric, no tenderness/mass/nodules Back: symmetric, no curvature. ROM normal. No CVA tenderness. Lungs: clear to auscultation bilaterally Heart: regular rate and rhythm, S1, S2 normal, no murmur, click, rub or gallop Abdomen: soft, non-tender; bowel sounds normal; no masses,  no organomegaly Pulses: 2+ and symmetric Skin: Skin color, texture, turgor normal. No rashes or lesions Lymph nodes: Cervical, supraclavicular, and axillary nodes normal.  Assessment and Plan:  Angina at rest She needs referral back to Dr. Mariah Milling for assessment of cardiac function given her current chest pain.  She is not  taking a long-acting nitrate.  She has been instructed to call 911  The next time she has chest pain not relieved with NTG x 3.  Diabetes mellitus type 2 with peripheral artery disease She needs to start insulin therapy as her raises the uncontrolled. She will bring her husband back for instruction on how to administer insulin. We'll start with Levemir 20 units daily. Continue Januvia and resume glipizide  Lichen sclerosus et atrophicus resume clobetasol ointment.   Updated Medication List Outpatient Encounter Prescriptions as of 07/27/2012  Medication Sig Dispense Refill  . aspirin 81 MG tablet Take 81 mg by mouth daily.        . Calcium & Magnesium Carbonates (MYLANTA PO) Take by mouth.        . carvedilol (COREG) 6.25 MG tablet Take 1 tablet (6.25 mg total) by mouth 2 (two) times daily.  180 tablet  3  . chlorhexidine (PERIDEX) 0.12 % solution Swish 1 capful in mouth for 30 seconds twice daily after brushing teeth until gone  240 mL  1  . clobetasol ointment (TEMOVATE) 0.05 % Apply every night to vaginal folds  30 g  1  . clopidogrel (PLAVIX) 75 MG tablet Take 1 tablet (75 mg total) by mouth daily.  90 tablet  3  . cyanocobalamin (,VITAMIN B-12,) 1000 MCG/ML injection Inject 1,000 mcg into the muscle once.      . donepezil (ARICEPT) 5 MG tablet Take  1 tablet (5 mg total) by mouth at bedtime as needed.  90 tablet  3  . DULoxetine (CYMBALTA) 60 MG capsule Take 1 capsule (60 mg total) by mouth daily.  30 capsule  2  . enalapril (VASOTEC) 10 MG tablet Take 1 tablet (10 mg total) by mouth daily.  30 tablet  3  . glucose blood (FREESTYLE LITE) test strip Check blood sugar 2 times daily.  60 each  11  . glucose monitoring kit (FREESTYLE) monitoring kit 1 each by Does not apply route as needed for other. PHARMACY PLEASE SUBSTITUTE WHATEVER  GLUCOMETER YOU CARRY  1 each  1  . HYDROcodone-acetaminophen (NORCO) 10-325 MG per tablet Take 1 tablet by mouth every 8 (eight) hours as needed for pain.  90 tablet  1  . levothyroxine (SYNTHROID, LEVOTHROID) 125 MCG tablet Take 1 tablet (125 mcg total) by mouth daily.  90 tablet  3  . magnesium oxide (MAG-OX) 400 MG tablet Take 400 mg by mouth daily.        . nitroGLYCERIN (NITROSTAT) 0.4 MG SL tablet Place 0.4 mg under the tongue every 5 (five) minutes as needed.        . ondansetron (ZOFRAN-ODT) 8 MG disintegrating tablet Take 1 tablet (8 mg total) by mouth every 8 (eight) hours as needed for nausea.  20 tablet  0  . pravastatin (PRAVACHOL) 80 MG tablet Take 1 tablet (80 mg total) by mouth every evening.  90 tablet  3  . traZODone (DESYREL) 100 MG tablet Take 1 tablet (100 mg total) by mouth at bedtime.  90 tablet  3  . zolpidem (AMBIEN) 5 MG tablet Take 2 tablets (10 mg total) by mouth at bedtime as needed for sleep.  30 tablet  1  . [DISCONTINUED] clobetasol ointment (TEMOVATE) 0.05 % Apply every night to vaginal folds  30 g  1  . glipiZIDE (GLUCOTROL) 10 MG tablet Before breakfast and supper (evening meal)  60 tablet  3  . insulin detemir (LEVEMIR FLEXPEN) 100 UNIT/ML injection Inject 20 Units into the skin at bedtime.  15 mL  12  . sitaGLIPtin (JANUVIA) 100 MG tablet Take 1 tablet (100 mg total) by mouth daily.  30 tablet  11     No orders of the defined types were placed in this encounter.    No  Follow-up on file.

## 2012-07-27 NOTE — Assessment & Plan Note (Addendum)
She needs referral back to Dr. Mariah Milling for assessment of cardiac function given her current chest pain.  She is not  taking a long-acting nitrate.  She has been instructed to return should repeat R. she has another episode number and nitrates.

## 2012-07-27 NOTE — Assessment & Plan Note (Signed)
She needs to start insulin therapy as her raises the uncontrolled. She will bring her husband back for instruction on how to administer insulin. We'll start with Levemir 20 units daily. Continue Januvia and resume glipizide

## 2012-07-27 NOTE — Assessment & Plan Note (Signed)
resume clobetasol ointment.

## 2012-07-28 ENCOUNTER — Ambulatory Visit: Payer: Medicare Other

## 2012-07-28 ENCOUNTER — Other Ambulatory Visit: Payer: Self-pay | Admitting: Internal Medicine

## 2012-07-29 ENCOUNTER — Ambulatory Visit: Payer: Medicare Other

## 2012-07-29 ENCOUNTER — Other Ambulatory Visit: Payer: Self-pay | Admitting: Internal Medicine

## 2012-07-29 MED ORDER — NITROGLYCERIN 0.4 MG SL SUBL: 0.4000 mg | SUBLINGUAL_TABLET | SUBLINGUAL | Status: DC | PRN

## 2012-07-29 NOTE — Telephone Encounter (Signed)
Med filled.  

## 2012-07-29 NOTE — Telephone Encounter (Signed)
Pt is needing nitroglycen (Heart Meds). She uses [facility] on [address]

## 2012-07-30 NOTE — Telephone Encounter (Signed)
Routing to Dr. Tullo 

## 2012-08-02 MED ORDER — ZOLPIDEM TARTRATE 5 MG PO TABS: 10.0000 mg | ORAL_TABLET | Freq: Every evening | ORAL | Status: DC | PRN

## 2012-08-02 NOTE — Addendum Note (Signed)
Addended by: Sherlene Shams on: 08/02/2012 01:43 PM   Modules accepted: Orders

## 2012-08-02 NOTE — Telephone Encounter (Signed)
Ok to refill,  Authorized in epic 

## 2012-08-04 ENCOUNTER — Ambulatory Visit: Payer: Medicare Other | Admitting: Internal Medicine

## 2012-08-05 ENCOUNTER — Ambulatory Visit: Payer: Medicare Other

## 2012-08-11 ENCOUNTER — Ambulatory Visit (INDEPENDENT_AMBULATORY_CARE_PROVIDER_SITE_OTHER): Payer: Medicare Other | Admitting: Cardiovascular Disease

## 2012-08-11 ENCOUNTER — Encounter: Payer: Self-pay | Admitting: Cardiovascular Disease

## 2012-08-11 ENCOUNTER — Telehealth: Payer: Self-pay | Admitting: Internal Medicine

## 2012-08-11 VITALS — BP 140/80 | HR 68 | Ht 67.0 in | Wt 146.0 lb

## 2012-08-11 DIAGNOSIS — E118 Type 2 diabetes mellitus with unspecified complications: Secondary | ICD-10-CM

## 2012-08-11 DIAGNOSIS — I209 Angina pectoris, unspecified: Secondary | ICD-10-CM

## 2012-08-11 DIAGNOSIS — Z5181 Encounter for therapeutic drug level monitoring: Secondary | ICD-10-CM | POA: Diagnosis not present

## 2012-08-11 DIAGNOSIS — E785 Hyperlipidemia, unspecified: Secondary | ICD-10-CM

## 2012-08-11 DIAGNOSIS — R0602 Shortness of breath: Secondary | ICD-10-CM | POA: Diagnosis not present

## 2012-08-11 DIAGNOSIS — R079 Chest pain, unspecified: Secondary | ICD-10-CM | POA: Diagnosis not present

## 2012-08-11 DIAGNOSIS — I2510 Atherosclerotic heart disease of native coronary artery without angina pectoris: Secondary | ICD-10-CM

## 2012-08-11 DIAGNOSIS — E1151 Type 2 diabetes mellitus with diabetic peripheral angiopathy without gangrene: Secondary | ICD-10-CM

## 2012-08-11 DIAGNOSIS — Z720 Tobacco use: Secondary | ICD-10-CM

## 2012-08-11 DIAGNOSIS — F0390 Unspecified dementia without behavioral disturbance: Secondary | ICD-10-CM

## 2012-08-11 DIAGNOSIS — I2089 Other forms of angina pectoris: Secondary | ICD-10-CM

## 2012-08-11 DIAGNOSIS — I208 Other forms of angina pectoris: Secondary | ICD-10-CM

## 2012-08-11 DIAGNOSIS — I739 Peripheral vascular disease, unspecified: Secondary | ICD-10-CM

## 2012-08-11 DIAGNOSIS — E1159 Type 2 diabetes mellitus with other circulatory complications: Secondary | ICD-10-CM

## 2012-08-11 DIAGNOSIS — F17200 Nicotine dependence, unspecified, uncomplicated: Secondary | ICD-10-CM

## 2012-08-11 DIAGNOSIS — E1165 Type 2 diabetes mellitus with hyperglycemia: Secondary | ICD-10-CM

## 2012-08-11 MED ORDER — ISOSORBIDE MONONITRATE ER 30 MG PO TB24: 30.0000 mg | ORAL_TABLET | Freq: Every day | ORAL | Status: DC

## 2012-08-11 NOTE — Patient Instructions (Signed)
Please start isosorbide one a day Ok to take with nitro under the tongue  We will schedule a cardiac cath for 1/30 Thursday in Am  Please call us if you have new issues that need to be addressed before your next appt.

## 2012-08-11 NOTE — Telephone Encounter (Signed)
Patient Information:  Caller Name: Lynisha  Phone: [phone]  Patient: Sarah Payne, Sarah Payne  Gender: Female  DOB: [birth year]  Age: 67 Years  PCP: Duncan Dull (Adults only)  Office Follow Up:  Does the office need to follow up with this patient?: Yes  Instructions For The Office: OFFICE PLEASE FOLLOW UP WITH PATIENT REGARDING BS >400  RN Note:  pt feels that  Symptoms  Reason For Call & Symptoms: pt states pt was recently placed on insulin on 08/09/12.  Pt has checked her BS this am and it is 400.  Pt does not take any other medication for DM except insulin at night  Reviewed Health History In EMR: Yes  Reviewed Medications In EMR: Yes  Reviewed Allergies In EMR: Yes  Reviewed Surgeries / Procedures: Yes  Date of Onset of Symptoms: 08/11/2012  Guideline(s) Used:  Diabetes - High Blood Sugar  Disposition Per Guideline:   Discuss with PCP and Callback by Nurse within 1 Hour  Reason For Disposition Reached:   Blood glucose > 400 mg/dl (22 mmol/l)  Advice Given:  Treatment - Liquids  Drink at least one glass (8 oz or 240 ml) of water per hour for the next 4 hours. (Reason: adequate hydration will reduce hyperglycemia).  Generally, you should try to drink 6-8 glasses of water each day.  Call Back If:  Rapid breathing occurs  You become worse.

## 2012-08-11 NOTE — Telephone Encounter (Signed)
Increase her levemir to 30 units  Daily and make appt for patient with her husband who is givng her the insulin   Referral to [facility] underway

## 2012-08-11 NOTE — Assessment & Plan Note (Signed)
Worsening chest pain concerning for underlying coronary artery disease. We will schedule a cardiac catheterization in the next week. We'll start her on isosorbide mononitrate 30 mg daily for symptom relief. Asked her to stay on aspirin and Plavix.

## 2012-08-11 NOTE — Assessment & Plan Note (Signed)
Cholesterol should improve as diabetes numbers improve

## 2012-08-11 NOTE — Assessment & Plan Note (Signed)
She reports sugar levels in the 400s. We have suggested that she call Dr. Darrick Huntsman. She may want to increase her insulin this evening and take additional insulin in the morning.

## 2012-08-11 NOTE — Progress Notes (Signed)
Patient ID: Sarah Payne, female    DOB: Sep 09, 1945, 67 y.o.   MRN: [id number]  HPI Comments: 67 year old woman with a long history of smoking, coronary artery disease, prior stent x3 placed to the mid left circumflex with moderate to severe LAD disease , stent placed to her distal RCA in February 2013, stent to the mid left circumflex in March 2013 , poorly controlled diabetes, depression, hypertension, who presents to the office today with worsening chest pain .  She has had problems in the past with medication compliance including taking her Plavix . She reports today having worsening chest pain over the past month. Chest pain on daily basis having to take nitroglycerin sometimes 2 at a time for chest pain symptoms. Symptoms occur at rest and with stress, intensity 6-7/10. She continues to smoke 1-1/2 packs per day. Recently changed to insulin and her sugar levels have been running 400 on a consistent basis. She takes 20 units in the evening .   she reports her chest pain symptoms are consistent with previous blockage requiring stent.  Echocardiogram February 07, 2011 shows normal LV systolic function, diastolic dysfunction, moderate inferior wall hypokinesis, normal right ventricular systolic pressures.  EKG shows normal sinus rhythm with rate 68 beats per minute with T-wave abnormality in leads V3 to V6, one and aVL   Outpatient Encounter Prescriptions as of 08/11/2012  Medication Sig Dispense Refill  . aspirin 81 MG tablet Take 81 mg by mouth daily.        . Calcium & Magnesium Carbonates (MYLANTA PO) Take by mouth.        . carvedilol (COREG) 6.25 MG tablet Take 1 tablet (6.25 mg total) by mouth 2 (two) times daily.  180 tablet  3  . chlorhexidine (PERIDEX) 0.12 % solution Swish 1 capful in mouth for 30 seconds twice daily after brushing teeth until gone  240 mL  1  . clobetasol ointment (TEMOVATE) 0.05 % Apply every night to vaginal folds  30 g  1  . clopidogrel (PLAVIX) 75 MG tablet Take 1  tablet (75 mg total) by mouth daily.  90 tablet  3  . cyanocobalamin (,VITAMIN B-12,) 1000 MCG/ML injection Inject 1,000 mcg into the muscle once.      . donepezil (ARICEPT) 5 MG tablet Take 1 tablet (5 mg total) by mouth at bedtime as needed.  90 tablet  3  . DULoxetine (CYMBALTA) 60 MG capsule Take 1 capsule (60 mg total) by mouth daily.  30 capsule  2  . enalapril (VASOTEC) 10 MG tablet Take 1 tablet (10 mg total) by mouth daily.  30 tablet  3  . glipiZIDE (GLUCOTROL) 10 MG tablet Before breakfast and supper (evening meal)  60 tablet  3  . glucose blood (FREESTYLE LITE) test strip Check blood sugar 2 times daily.  60 each  11  . glucose monitoring kit (FREESTYLE) monitoring kit 1 each by Does not apply route as needed for other. PHARMACY PLEASE SUBSTITUTE WHATEVER GLUCOMETER YOU CARRY  1 each  1  . HYDROcodone-acetaminophen (NORCO) 10-325 MG per tablet Take 1 tablet by mouth every 8 (eight) hours as needed for pain.  90 tablet  1  . insulin detemir (LEVEMIR FLEXPEN) 100 UNIT/ML injection Inject 20 Units into the skin at bedtime.  15 mL  12  . levothyroxine (SYNTHROID, LEVOTHROID) 125 MCG tablet Take 1 tablet (125 mcg total) by mouth daily.  90 tablet  3  . magnesium oxide (MAG-OX) 400 MG tablet Take 400  mg by mouth daily.        . nitroGLYCERIN (NITROSTAT) 0.4 MG SL tablet Place 1 tablet (0.4 mg total) under the tongue every 5 (five) minutes as needed.  30 tablet  1  . ondansetron (ZOFRAN-ODT) 8 MG disintegrating tablet Take 1 tablet (8 mg total) by mouth every 8 (eight) hours as needed for nausea.  20 tablet  0  . pravastatin (PRAVACHOL) 80 MG tablet Take 1 tablet (80 mg total) by mouth every evening.  90 tablet  3  . sitaGLIPtin (JANUVIA) 100 MG tablet Take 1 tablet (100 mg total) by mouth daily.  30 tablet  11  . traZODone (DESYREL) 100 MG tablet Take 1 tablet (100 mg total) by mouth at bedtime.  90 tablet  3  . zolpidem (AMBIEN) 5 MG tablet Take 2 tablets (10 mg total) by mouth at bedtime as  needed for sleep.  60 tablet  3  . isosorbide mononitrate (IMDUR) 30 MG 24 hr tablet Take 1 tablet (30 mg total) by mouth daily.  30 tablet  6    Review of Systems  Constitutional: Negative.   HENT: Negative.   Eyes: Negative.   Respiratory: Negative.   Cardiovascular: Positive for chest pain.  Gastrointestinal: Negative.   Musculoskeletal: Negative.   Skin: Negative.   Neurological: Negative.   Hematological: Negative.   Psychiatric/Behavioral: Negative.   All other systems reviewed and are negative.    BP 140/80  Pulse 68  Ht 5\' 7"  (1.702 m)  Wt 146 lb (66.225 kg)  BMI 22.87 kg/m2  Physical Exam  Nursing note and vitals reviewed. Constitutional: She is oriented to person, place, and time. She appears well-developed and well-nourished.  HENT:  Head: Normocephalic.  Nose: Nose normal.  Mouth/Throat: Oropharynx is clear and moist.  Eyes: Conjunctivae normal are normal. Pupils are equal, round, and reactive to light.  Neck: Normal range of motion. Neck supple. No JVD present.  Cardiovascular: Normal rate, regular rhythm, S1 normal, S2 normal, normal heart sounds and intact distal pulses.  Exam reveals no gallop and no friction rub.   No murmur heard. Pulmonary/Chest: Effort normal and breath sounds normal. No respiratory distress. She has no wheezes. She has no rales. She exhibits no tenderness.  Abdominal: Soft. Bowel sounds are normal. She exhibits no distension. There is no tenderness.  Musculoskeletal: Normal range of motion. She exhibits no edema and no tenderness.  Lymphadenopathy:    She has no cervical adenopathy.  Neurological: She is alert and oriented to person, place, and time. Coordination normal.  Skin: Skin is warm and dry. No rash noted. No erythema.  Psychiatric: She has a normal mood and affect. Her behavior is normal. Judgment and thought content normal.         Assessment and Plan

## 2012-08-11 NOTE — Assessment & Plan Note (Signed)
She reports that she is going to try an electronic cigarette. We have strongly encouraged her to stop smoking.

## 2012-08-12 LAB — PROTIME-INR
INR: 1 (ref 0.8–1.2)
Prothrombin Time: 10.3 s (ref 9.1–12.0)

## 2012-08-12 LAB — BASIC METABOLIC PANEL
BUN/Creatinine Ratio: 19 (ref 11–26)
BUN: 17 mg/dL (ref 8–27)
CO2: 23 mmol/L (ref 19–28)
Calcium: 9.9 mg/dL (ref 8.6–10.2)
Chloride: 97 mmol/L (ref 97–108)
Creatinine, Ser: 0.9 mg/dL (ref 0.57–1.00)
GFR calc Af Amer: 78 mL/min/{1.73_m2} (ref >59)
GFR calc non Af Amer: 68 mL/min/{1.73_m2} (ref >59)
Glucose: 166 mg/dL — ABNORMAL HIGH (ref 65–99)
Potassium: 3.9 mmol/L (ref 3.5–5.2)
Sodium: 134 mmol/L (ref 134–144)

## 2012-08-12 LAB — CBC WITH DIFFERENTIAL/PLATELET
Basophils Absolute: 0 10*3/uL (ref 0.0–0.2)
Basos: 0 % (ref 0–3)
Eos: 1 % (ref 0–5)
Eosinophils Absolute: 0.1 10*3/uL (ref 0.0–0.4)
HCT: 40.3 % (ref 34.0–46.6)
Hemoglobin: 14 g/dL (ref 11.1–15.9)
Immature Grans (Abs): 0 10*3/uL (ref 0.0–0.1)
Immature Granulocytes: 0 % (ref 0–2)
Lymphocytes Absolute: 2.9 10*3/uL (ref 0.7–3.1)
Lymphs: 24 % (ref 14–46)
MCH: 32.6 pg (ref 26.6–33.0)
MCHC: 34.7 g/dL (ref 31.5–35.7)
MCV: 94 fL (ref 79–97)
Monocytes Absolute: 0.9 10*3/uL (ref 0.1–0.9)
Monocytes: 7 % (ref 4–12)
Neutrophils Absolute: 8.1 10*3/uL — ABNORMAL HIGH (ref 1.4–7.0)
Neutrophils Relative %: 68 % (ref 40–74)
RBC: 4.3 x10E6/uL (ref 3.77–5.28)
RDW: 13.5 % (ref 12.3–15.4)
WBC: 12.1 10*3/uL — ABNORMAL HIGH (ref 3.4–10.8)

## 2012-08-12 NOTE — Telephone Encounter (Signed)
Pt notified. Also stated that next Thursday pt is going to have stint repair with Dr. Mariah Milling.

## 2012-08-14 ENCOUNTER — Other Ambulatory Visit: Payer: Self-pay | Admitting: Internal Medicine

## 2012-08-19 ENCOUNTER — Ambulatory Visit: Payer: Self-pay | Admitting: Cardiovascular Disease

## 2012-08-19 DIAGNOSIS — I798 Other disorders of arteries, arterioles and capillaries in diseases classified elsewhere: Secondary | ICD-10-CM | POA: Diagnosis not present

## 2012-08-19 DIAGNOSIS — E785 Hyperlipidemia, unspecified: Secondary | ICD-10-CM | POA: Diagnosis not present

## 2012-08-19 DIAGNOSIS — E1159 Type 2 diabetes mellitus with other circulatory complications: Secondary | ICD-10-CM | POA: Diagnosis not present

## 2012-08-19 DIAGNOSIS — R0602 Shortness of breath: Secondary | ICD-10-CM | POA: Diagnosis not present

## 2012-08-19 DIAGNOSIS — F17200 Nicotine dependence, unspecified, uncomplicated: Secondary | ICD-10-CM | POA: Diagnosis not present

## 2012-08-19 DIAGNOSIS — R079 Chest pain, unspecified: Secondary | ICD-10-CM | POA: Diagnosis not present

## 2012-08-19 DIAGNOSIS — I209 Angina pectoris, unspecified: Secondary | ICD-10-CM | POA: Diagnosis not present

## 2012-08-19 DIAGNOSIS — I10 Essential (primary) hypertension: Secondary | ICD-10-CM | POA: Diagnosis not present

## 2012-08-19 DIAGNOSIS — Z7982 Long term (current) use of aspirin: Secondary | ICD-10-CM | POA: Diagnosis not present

## 2012-08-19 DIAGNOSIS — I2510 Atherosclerotic heart disease of native coronary artery without angina pectoris: Secondary | ICD-10-CM | POA: Diagnosis not present

## 2012-08-19 DIAGNOSIS — Z79899 Other long term (current) drug therapy: Secondary | ICD-10-CM | POA: Diagnosis not present

## 2012-08-19 HISTORY — PX: CORONARY ANGIOPLASTY WITH STENT PLACEMENT: SHX49

## 2012-08-19 LAB — CK-MB: CK-MB: 11.9 ng/mL — ABNORMAL HIGH (ref 0.5–3.6)

## 2012-08-19 NOTE — Telephone Encounter (Signed)
Pt stating she needs prescriptions and needs to speak with a nurse.  Please call.

## 2012-08-20 ENCOUNTER — Encounter: Payer: Self-pay | Admitting: Cardiovascular Disease

## 2012-08-20 DIAGNOSIS — I209 Angina pectoris, unspecified: Secondary | ICD-10-CM | POA: Diagnosis not present

## 2012-08-20 LAB — BASIC METABOLIC PANEL
Anion Gap: 6 — ABNORMAL LOW (ref 7–16)
BUN: 13 mg/dL (ref 7–18)
Calcium, Total: 9 mg/dL (ref 8.5–10.1)
Chloride: 103 mmol/L (ref 98–107)
Co2: 29 mmol/L (ref 21–32)
Creatinine: 0.71 mg/dL (ref 0.60–1.30)
EGFR (African American): >60
EGFR (Non-African Amer.): >60
Glucose: 188 mg/dL — ABNORMAL HIGH (ref 65–99)
Osmolality: 281 (ref 275–301)
Potassium: 3.7 mmol/L (ref 3.5–5.1)
Sodium: 138 mmol/L (ref 136–145)

## 2012-08-20 MED ORDER — NITROGLYCERIN 0.4 MG SL SUBL: 0.4000 mg | SUBLINGUAL_TABLET | SUBLINGUAL | Status: DC | PRN

## 2012-08-20 MED ORDER — CARVEDILOL 6.25 MG PO TABS: 6.2500 mg | ORAL_TABLET | Freq: Two times a day (BID) | ORAL | Status: DC

## 2012-08-20 MED ORDER — ENALAPRIL MALEATE 10 MG PO TABS: 10.0000 mg | ORAL_TABLET | Freq: Every day | ORAL | Status: DC

## 2012-08-20 MED ORDER — PRAVASTATIN SODIUM 80 MG PO TABS: 80.0000 mg | ORAL_TABLET | Freq: Every evening | ORAL | Status: DC

## 2012-08-20 MED ORDER — GLUCOSE BLOOD VI STRP: ORAL_STRIP | Status: DC

## 2012-08-20 MED ORDER — SITAGLIPTIN PHOSPHATE 100 MG PO TABS: 100.0000 mg | ORAL_TABLET | Freq: Every day | ORAL | Status: DC

## 2012-08-20 MED ORDER — ISOSORBIDE MONONITRATE ER 30 MG PO TB24: 30.0000 mg | ORAL_TABLET | Freq: Every day | ORAL | Status: DC

## 2012-08-20 MED ORDER — CLOPIDOGREL BISULFATE 75 MG PO TABS: 75.0000 mg | ORAL_TABLET | Freq: Every day | ORAL | Status: DC

## 2012-08-20 MED ORDER — LEVOTHYROXINE SODIUM 125 MCG PO TABS: 125.0000 ug | ORAL_TABLET | Freq: Every day | ORAL | Status: DC

## 2012-08-20 MED ORDER — TRAZODONE HCL 100 MG PO TABS: 100.0000 mg | ORAL_TABLET | Freq: Every day | ORAL | Status: DC

## 2012-08-20 MED ORDER — INSULIN DETEMIR 100 UNIT/ML ~~LOC~~ SOLN: 30.0000 [IU] | Freq: Every day | SUBCUTANEOUS | Status: DC

## 2012-08-20 MED ORDER — ZOLPIDEM TARTRATE 5 MG PO TABS: 10.0000 mg | ORAL_TABLET | Freq: Every evening | ORAL | Status: DC | PRN

## 2012-08-20 MED ORDER — DONEPEZIL HCL 5 MG PO TABS: 5.0000 mg | ORAL_TABLET | Freq: Every day | ORAL | Status: DC

## 2012-08-20 MED ORDER — GLIPIZIDE 10 MG PO TABS: ORAL_TABLET | ORAL | Status: DC

## 2012-08-20 NOTE — Telephone Encounter (Signed)
Need a hard copy of all her prescriptions for 90 days so her husband may send them in through the [place]. When this is done, please call patient so she may come to pick them up.Lambert Keto

## 2012-08-20 NOTE — Telephone Encounter (Signed)
If ok to fill just sign the orders and I will grab for you to sign.

## 2012-08-20 NOTE — Telephone Encounter (Signed)
Ok to do , I will sign

## 2012-08-20 NOTE — Telephone Encounter (Signed)
Signed will call pt on Monday.

## 2012-08-23 NOTE — Telephone Encounter (Signed)
Placed up front for pick up. Pt notified.

## 2012-08-27 ENCOUNTER — Encounter: Payer: Medicare Other | Admitting: Cardiovascular Disease

## 2012-08-31 ENCOUNTER — Encounter: Payer: Self-pay | Admitting: Cardiovascular Disease

## 2012-08-31 ENCOUNTER — Ambulatory Visit (INDEPENDENT_AMBULATORY_CARE_PROVIDER_SITE_OTHER): Payer: Medicare Other | Admitting: Cardiovascular Disease

## 2012-08-31 VITALS — BP 108/64 | HR 65 | Ht 67.0 in | Wt 151.0 lb

## 2012-08-31 DIAGNOSIS — E785 Hyperlipidemia, unspecified: Secondary | ICD-10-CM | POA: Diagnosis not present

## 2012-08-31 DIAGNOSIS — E1159 Type 2 diabetes mellitus with other circulatory complications: Secondary | ICD-10-CM

## 2012-08-31 DIAGNOSIS — I739 Peripheral vascular disease, unspecified: Secondary | ICD-10-CM

## 2012-08-31 DIAGNOSIS — Z720 Tobacco use: Secondary | ICD-10-CM

## 2012-08-31 DIAGNOSIS — F17200 Nicotine dependence, unspecified, uncomplicated: Secondary | ICD-10-CM | POA: Diagnosis not present

## 2012-08-31 DIAGNOSIS — E1151 Type 2 diabetes mellitus with diabetic peripheral angiopathy without gangrene: Secondary | ICD-10-CM

## 2012-08-31 DIAGNOSIS — I2510 Atherosclerotic heart disease of native coronary artery without angina pectoris: Secondary | ICD-10-CM

## 2012-08-31 NOTE — Progress Notes (Signed)
Patient ID: Sarah Payne, female    DOB: [birth year], 67 y.o.   MRN: [id number]  HPI Comments: 67 year old woman with a long history of smoking, coronary artery disease, prior stent x3 placed to the mid left circumflex with moderate to severe LAD disease , stent placed to her distal RCA in February 2013, stent to the mid left circumflex in March 2013 , poorly controlled diabetes, depression, hypertension, and recent weight several weeks ago for chest pain, cardiac catheterization 08/19/2012 showing moderate mid LAD disease at the takeoff of the diagonal vessel, severe ostial to mid RCA disease, ejection fraction greater than 55%. She had FFR pressure wire showing severe disease of the RCA with drug-eluting stent x2 placed.   In followup today she reports that her chest pain has resolved Since the stents were placed.she has felt very fatigued. She's not checking her sugars. She is drinking many Pepsi's during the daytime. She ran out of glucose test strips. Blood pressure also running low.  She is sleeping well if she takes her sleeping medication. She is taking her aspirin and Plavix, significant bruising. She is trying to cut back on her smoking, still half pack per day  Echocardiogram February 07, 2011 shows normal LV systolic function, diastolic dysfunction, moderate inferior wall hypokinesis, normal right ventricular systolic pressures.  EKG shows normal sinus rhythm with rate 65 beats per minute with T-wave abnormality in leads V3 to V6, one and aVL   Outpatient Encounter Prescriptions as of 08/31/2012  Medication Sig Dispense Refill  . amitriptyline (ELAVIL) 150 MG tablet TAKE 1 HOUR BEFORE BEDTIME EVERY NIGHT FOR INSOMNIA  30 tablet  3  . aspirin 81 MG tablet Take 81 mg by mouth daily.        . Calcium & Magnesium Carbonates (MYLANTA PO) Take by mouth.        . carvedilol (COREG) 6.25 MG tablet Take 1 tablet (6.25 mg total) by mouth 2 (two) times daily.  180 tablet  3  . chlorhexidine (PERIDEX)  0.12 % solution Swish 1 capful in mouth for 30 seconds twice daily after brushing teeth until gone  240 mL  1  . clobetasol ointment (TEMOVATE) 0.05 % Apply every night to vaginal folds  30 g  1  . clopidogrel (PLAVIX) 75 MG tablet Take 1 tablet (75 mg total) by mouth daily.  90 tablet  3  . cyanocobalamin (,VITAMIN B-12,) 1000 MCG/ML injection Inject 1,000 mcg into the muscle once.      . CYMBALTA 60 MG capsule TAKE 1 CAPSULE BY MOUTH DAILY.  30 capsule  2  . donepezil (ARICEPT) 5 MG tablet Take 1 tablet (5 mg total) by mouth at bedtime.  90 tablet  3  . DULoxetine (CYMBALTA) 60 MG capsule Take 1 capsule (60 mg total) by mouth daily.  30 capsule  2  . enalapril (VASOTEC) 10 MG tablet Take 1 tablet (10 mg total) by mouth daily.  90 tablet  3  . glipiZIDE (GLUCOTROL) 10 MG tablet Before breakfast and supper (evening meal)  180 tablet  3  . glucose blood (FREESTYLE LITE) test strip Check blood sugar 2 times daily.  200 each  3  . glucose monitoring kit (FREESTYLE) monitoring kit 1 each by Does not apply route as needed for other. PHARMACY PLEASE SUBSTITUTE WHATEVER GLUCOMETER YOU CARRY  1 each  1  . HYDROcodone-acetaminophen (NORCO) 10-325 MG per tablet Take 1 tablet by mouth every 8 (eight) hours as needed for pain.  90 tablet  1  . insulin detemir (LEVEMIR FLEXPEN) 100 UNIT/ML injection Inject 30 Units into the skin at bedtime.  45 mL  3  . isosorbide mononitrate (IMDUR) 30 MG 24 hr tablet Take 1 tablet (30 mg total) by mouth daily.  90 tablet  3  . levothyroxine (SYNTHROID, LEVOTHROID) 125 MCG tablet Take 1 tablet (125 mcg total) by mouth daily.  90 tablet  3  . magnesium oxide (MAG-OX) 400 MG tablet Take 400 mg by mouth daily.        . nitroGLYCERIN (NITROSTAT) 0.4 MG SL tablet Place 1 tablet (0.4 mg total) under the tongue every 5 (five) minutes as needed.  30 tablet  1  . ondansetron (ZOFRAN-ODT) 8 MG disintegrating tablet Take 1 tablet (8 mg total) by mouth every 8 (eight) hours as needed for  nausea.  20 tablet  0  . pravastatin (PRAVACHOL) 80 MG tablet Take 1 tablet (80 mg total) by mouth every evening.  90 tablet  3  . sitaGLIPtin (JANUVIA) 100 MG tablet Take 1 tablet (100 mg total) by mouth daily.  90 tablet  3  . traZODone (DESYREL) 100 MG tablet Take 1 tablet (100 mg total) by mouth at bedtime.  90 tablet  3  . zolpidem (AMBIEN) 5 MG tablet Take 2 tablets (10 mg total) by mouth at bedtime as needed for sleep.  180 tablet  3    Review of Systems  Constitutional: Positive for fatigue.  HENT: Negative.   Eyes: Negative.   Respiratory: Negative.   Gastrointestinal: Negative.   Musculoskeletal: Negative.   Skin: Negative.   Neurological: Negative.   Psychiatric/Behavioral: Negative.   All other systems reviewed and are negative.    BP 108/64  Pulse 65  Ht 5\' 7"  (1.702 m)  Wt 151 lb (68.493 kg)  BMI 23.64 kg/m2  Physical Exam  Nursing note and vitals reviewed. Constitutional: She is oriented to person, place, and time. She appears well-developed and well-nourished.  HENT:  Head: Normocephalic.  Nose: Nose normal.  Mouth/Throat: Oropharynx is clear and moist.  Eyes: Conjunctivae are normal. Pupils are equal, round, and reactive to light.  Neck: Normal range of motion. Neck supple. No JVD present.  Cardiovascular: Normal rate, regular rhythm, S1 normal, S2 normal, normal heart sounds and intact distal pulses.  Exam reveals no gallop and no friction rub.   No murmur heard. Pulmonary/Chest: Effort normal and breath sounds normal. No respiratory distress. She has no wheezes. She has no rales. She exhibits no tenderness.  Abdominal: Soft. Bowel sounds are normal. She exhibits no distension. There is no tenderness.  Musculoskeletal: Normal range of motion. She exhibits no edema and no tenderness.  Lymphadenopathy:    She has no cervical adenopathy.  Neurological: She is alert and oriented to person, place, and time. Coordination normal.  Skin: Skin is warm and dry. No  rash noted. No erythema.  Psychiatric: She has a normal mood and affect. Her behavior is normal. Judgment and thought content normal.    Assessment and Plan

## 2012-08-31 NOTE — Assessment & Plan Note (Signed)
Feels better after drug-eluting stent x2 to the proximal and mid RCA. No further angina. We'll continue aspirin Plavix. I recommended she stop smoking, better diabetes control.

## 2012-08-31 NOTE — Assessment & Plan Note (Signed)
Continue statin. Goal LDL less than 70 

## 2012-08-31 NOTE — Assessment & Plan Note (Signed)
We have encouraged her to continue to work on weaning her cigarettes and smoking cessation. She will continue to work on this and does not want any assistance with chantix.  

## 2012-08-31 NOTE — Assessment & Plan Note (Signed)
She needs dietary guidance. Told her to stop drinking Pepsi. She needs followup with Dr. Darrick Huntsman. She reports that she will call and make an appointment.

## 2012-08-31 NOTE — Patient Instructions (Addendum)
  Please hold the enalapril, monitor your blood pressure. If you still feel fatigue, restart a 1/2 dose of enalapril  No Pepsi!  Monitor your sugars  Please call us if you have new issues that need to be addressed before your next appt.  Your physician wants you to follow-up in: 6 months.  You will receive a reminder letter in the mail two months in advance. If you don't receive a letter, please call our office to schedule the follow-up appointment.

## 2012-09-02 ENCOUNTER — Encounter: Payer: Medicare Other | Admitting: Cardiovascular Disease

## 2012-09-14 ENCOUNTER — Other Ambulatory Visit: Payer: Self-pay | Admitting: Internal Medicine

## 2012-09-15 NOTE — Telephone Encounter (Signed)
Ok to refill,  Authorized in epic 

## 2012-09-16 MED ORDER — HYDROCODONE-ACETAMINOPHEN 5-325 MG PO TABS: 1.0000 | ORAL_TABLET | Freq: Four times a day (QID) | ORAL | Status: DC | PRN

## 2012-09-16 NOTE — Telephone Encounter (Signed)
rx called into pharmacy

## 2012-09-16 NOTE — Addendum Note (Signed)
Addended by: Sueanne Margarita on: 09/16/2012 11:38 AM   Modules accepted: Orders, Medications

## 2012-10-06 ENCOUNTER — Encounter: Payer: Self-pay | Admitting: Cardiovascular Disease

## 2012-10-06 ENCOUNTER — Encounter: Payer: Self-pay | Admitting: Internal Medicine

## 2012-10-06 ENCOUNTER — Ambulatory Visit (INDEPENDENT_AMBULATORY_CARE_PROVIDER_SITE_OTHER): Payer: Medicare Other | Admitting: Internal Medicine

## 2012-10-06 VITALS — BP 122/82 | HR 94 | Temp 97.9°F | Resp 16 | Wt 148.8 lb

## 2012-10-06 DIAGNOSIS — G629 Polyneuropathy, unspecified: Secondary | ICD-10-CM

## 2012-10-06 DIAGNOSIS — M549 Dorsalgia, unspecified: Secondary | ICD-10-CM

## 2012-10-06 DIAGNOSIS — G589 Mononeuropathy, unspecified: Secondary | ICD-10-CM

## 2012-10-06 DIAGNOSIS — E039 Hypothyroidism, unspecified: Secondary | ICD-10-CM

## 2012-10-06 DIAGNOSIS — R5383 Other fatigue: Secondary | ICD-10-CM

## 2012-10-06 DIAGNOSIS — R29898 Other symptoms and signs involving the musculoskeletal system: Secondary | ICD-10-CM | POA: Diagnosis not present

## 2012-10-06 DIAGNOSIS — E1159 Type 2 diabetes mellitus with other circulatory complications: Secondary | ICD-10-CM | POA: Diagnosis not present

## 2012-10-06 DIAGNOSIS — R5381 Other malaise: Secondary | ICD-10-CM

## 2012-10-06 DIAGNOSIS — I739 Peripheral vascular disease, unspecified: Secondary | ICD-10-CM

## 2012-10-06 DIAGNOSIS — G4710 Hypersomnia, unspecified: Secondary | ICD-10-CM

## 2012-10-06 DIAGNOSIS — Z9889 Other specified postprocedural states: Secondary | ICD-10-CM

## 2012-10-06 DIAGNOSIS — Z9861 Coronary angioplasty status: Secondary | ICD-10-CM | POA: Diagnosis not present

## 2012-10-06 DIAGNOSIS — Z5189 Encounter for other specified aftercare: Secondary | ICD-10-CM | POA: Diagnosis not present

## 2012-10-06 DIAGNOSIS — E1151 Type 2 diabetes mellitus with diabetic peripheral angiopathy without gangrene: Secondary | ICD-10-CM

## 2012-10-06 NOTE — Progress Notes (Signed)
Patient ID: Sarah Payne, female   DOB: March 19, 1946, 67 y.o.   MRN: [id number]  Patient Active Problem List  Diagnosis  . CAD, multiple vessel  . Tobacco abuse  . Hyperlipidemia  . Hypothyroidism  . Depression  . History of lumbar discectomy  . Diabetes mellitus type 2 with peripheral artery disease  . Angina at rest  . Headache  . Lichen sclerosus et atrophicus  . Bronchitis, chronic obstructive  . Chronic systolic congestive heart failure  . Other malaise and fatigue  . Leg weakness, bilateral    Subjective:  CC:   Chief Complaint  Patient presents with  . Leg issues    legs give out on pt started this am    HPI:   Sarah Payne a 67 y.o. female who presents Fatigue.  She is sleeping 3 to 4 hours during the day and  7 to 8 hours at night. No prior sleep study, doesn't want ot have one .  Her husband has sleep apnea so her sleeping habits are not noticed   Recurrent  Episodes of leg weakness., left greater than right with no serious falls but has been having some stumbling which occurs with rising from chairs or going up and down stairs. Denies claudication .  Reports aht both legs have felt heavy since  Her last cardiac stent was placed about a month ago.  She is attending cardiopulmonary rehab,  But is unable to participate fully in the exercises due to leg weakness.   DM followup:  She is not following a diabetic diet.  She gave up soft drinks last month after Dr Mariah Milling advised her to last month.  She is currently drinking a sweet tea from [employer].  She states that  home she has replaced sugar with Splenda.  Does not know what her sugars are running.  Glucometer is not working most of the time . The RN at cardiopulmonary rehab has offered to evalute her glucometer with next visit so she did not bring it with her today      Past Medical History  Diagnosis Date  . Diabetes mellitus   . Hypertension   . Hypothyroidism   . Depression   . Vascular dementia   . Tobacco  abuse     a. 45 pack year history - quit 09/16/11  . Chronic low back pain   . Coronary artery disease     a. s/p PCI/DES LCX x 3;  b. NSTEMI 09/16/11;  c.   09/17/11 Cath [facility]) Severe RCA/LCX dzs - RCA treated, LCX intervention pending w/ Dr. Juliann Pares    Past Surgical History  Procedure Laterality Date  . Back surgery    . Abdominal hysterectomy    . Appendectomy    . Cardiac catheterization  2013  . Coronary angioplasty  2012    stent x 3   . Coronary angioplasty with stent placement  2013       The following portions of the patient's history were reviewed and updated as appropriate: Allergies, current medications, and problem list.    Review of Systems:   12 Pt  review of systems was negative except those addressed in the HPI,     History   Social History  . Marital Status: Married    Spouse Name: N/A    Number of Children: N/A  . Years of Education: N/A   Occupational History  . Not on file.   Social History Main Topics  . Smoking status: Current  Every Day Smoker -- 0.50 packs/day    Types: Cigarettes  . Smokeless tobacco: Never Used     Comment: Has cut back, trying to quit.  . Alcohol Use: No  . Drug Use: No  . Sexually Active: Not on file   Other Topics Concern  . Not on file   Social History Narrative   Lives at home with her husband in [place].  Previously used marijuana - quit.    Objective:  BP 122/82  Pulse 94  Temp(Src) 97.9 F (36.6 C) (Oral)  Resp 16  Wt 148 lb 12 oz (67.473 kg)  BMI 23.29 kg/m2  SpO2 97%  General appearance: alert, cooperative and appears stated age Ears: normal TM's and external ear canals both ears Throat: lips, mucosa, and tongue normal; teeth and gums normal Neck: no adenopathy, no carotid bruit, supple, symmetrical, trachea midline and thyroid not enlarged, symmetric, no tenderness/mass/nodules Back: symmetric, no curvature. ROM normal. No CVA tenderness. Lungs: clear to auscultation bilaterally Heart:  regular rate and rhythm, S1, S2 normal, no murmur, click, rub or gallop Abdomen: soft, non-tender; bowel sounds normal; no masses,  no organomegaly Pulses: 2+ and symmetric Skin: Skin color, texture, turgor normal. No rashes or lesions Lymph nodes: Cervical, supraclavicular, and axillary nodes normal.  Assessment and Plan:  Other malaise and fatigue Give her hypersomnolence, CAD and COPD, I suspect that she has sleep apnea or nocturnal hypoxia from other causes.  Ordering sleep study.   Leg weakness, bilateral Her neurologic exam today is notable for left greater than right leg weakness with decreased patellar and Achilles reflexes on the left side. She has a history of chronic low back pain with no prior lumbar MRI done. She had a cardiac stent placed in early February, which may preclude MRI imaging at this time. If this is the case we will refer to neurology for EMG/nerve conduction studies.   History of lumbar discectomy She has a prior history of lumbar back surgery with fusion. Plain films in 2012 showed narrowing of the space at L2-L3 and intact hardware from L3 to S1. will consider MRI of lumbar spine for further evaluation. Continue judicious use of Vicodin as needed for pain.    Diabetes mellitus type 2 with peripheral artery disease Uncontrolled.  diabetes control has improved from a hemoglobin A1c of 14.6 to currently 9.9. All previous attempts to maintain and improve diabetes control have been  hindered by lack of  Available family support and patient's vascular dementia.  Her husband has PTSD and cannot be counted on to help monitor her diabetes. Her daughter does which she can but she has run family to take care of. Referral to diabetes education will again be attempted.    Updated Medication List Outpatient Encounter Prescriptions as of 10/06/2012  Medication Sig Dispense Refill  . amitriptyline (ELAVIL) 150 MG tablet TAKE 1 HOUR BEFORE BEDTIME EVERY NIGHT FOR INSOMNIA  30  tablet  3  . aspirin 81 MG tablet Take 81 mg by mouth daily.        . Calcium & Magnesium Carbonates (MYLANTA PO) Take by mouth.        . carvedilol (COREG) 6.25 MG tablet Take 1 tablet (6.25 mg total) by mouth 2 (two) times daily.  180 tablet  3  . chlorhexidine (PERIDEX) 0.12 % solution Swish 1 capful in mouth for 30 seconds twice daily after brushing teeth until gone  240 mL  1  . clobetasol ointment (TEMOVATE) 0.05 % Apply every night  to vaginal folds  30 g  1  . clopidogrel (PLAVIX) 75 MG tablet Take 1 tablet (75 mg total) by mouth daily.  90 tablet  3  . cyanocobalamin (,VITAMIN B-12,) 1000 MCG/ML injection Inject 1,000 mcg into the muscle once.      . donepezil (ARICEPT) 5 MG tablet Take 1 tablet (5 mg total) by mouth at bedtime.  90 tablet  3  . DULoxetine (CYMBALTA) 60 MG capsule Take 1 capsule (60 mg total) by mouth daily.  30 capsule  2  . enalapril (VASOTEC) 10 MG tablet Take 1 tablet (10 mg total) by mouth daily.  90 tablet  3  . glipiZIDE (GLUCOTROL) 10 MG tablet Before breakfast and supper (evening meal)  180 tablet  3  . glucose blood (FREESTYLE LITE) test strip Check blood sugar 2 times daily.  200 each  3  . glucose monitoring kit (FREESTYLE) monitoring kit 1 each by Does not apply route as needed for other. PHARMACY PLEASE SUBSTITUTE WHATEVER GLUCOMETER YOU CARRY  1 each  1  . HYDROcodone-acetaminophen (NORCO/VICODIN) 5-325 MG per tablet Take 1 tablet by mouth every 6 (six) hours as needed for pain.  90 tablet  1  . insulin detemir (LEVEMIR FLEXPEN) 100 UNIT/ML injection Inject 30 Units into the skin at bedtime.  45 mL  3  . isosorbide mononitrate (IMDUR) 30 MG 24 hr tablet Take 1 tablet (30 mg total) by mouth daily.  90 tablet  3  . levothyroxine (SYNTHROID, LEVOTHROID) 125 MCG tablet Take 1 tablet (125 mcg total) by mouth daily.  90 tablet  3  . magnesium oxide (MAG-OX) 400 MG tablet Take 400 mg by mouth daily.        . nitroGLYCERIN (NITROSTAT) 0.4 MG SL tablet Place 1 tablet  (0.4 mg total) under the tongue every 5 (five) minutes as needed.  30 tablet  1  . ondansetron (ZOFRAN-ODT) 8 MG disintegrating tablet Take 1 tablet (8 mg total) by mouth every 8 (eight) hours as needed for nausea.  20 tablet  0  . pravastatin (PRAVACHOL) 80 MG tablet Take 1 tablet (80 mg total) by mouth every evening.  90 tablet  3  . sitaGLIPtin (JANUVIA) 100 MG tablet Take 1 tablet (100 mg total) by mouth daily.  90 tablet  3  . traZODone (DESYREL) 100 MG tablet Take 1 tablet (100 mg total) by mouth at bedtime.  90 tablet  3  . zolpidem (AMBIEN) 5 MG tablet Take 2 tablets (10 mg total) by mouth at bedtime as needed for sleep.  180 tablet  3  . [DISCONTINUED] CYMBALTA 60 MG capsule TAKE 1 CAPSULE BY MOUTH DAILY.  30 capsule  2   No facility-administered encounter medications on file as of 10/06/2012.     Orders Placed This Encounter  Procedures  . MR Lumbar Spine Wo Contrast  . Comprehensive metabolic panel  . Hemoglobin A1c  . TSH  . Ambulatory referral to Neurology  . Ambulatory referral to diabetic education  . Nocturnal polysomnography (NPSG)    Return in about 3 months (around 01/06/2013).

## 2012-10-06 NOTE — Patient Instructions (Addendum)
You leg weakness may be coming from your lower back  I am ordering a neurology referraql for additional studies  Please stop dringing sugary drinks  And follow a low glycemic index diet like the one below   This is  One version of a  "Low GI"  Diet:  It will still lower your blood sugars and allow you to lose 4 to 8  lbs  per month if you follow it carefully and combine it with 30 minutes of aerobic exercise 5 days per week .   All of the foods can be found at grocery stores and in bulk at [employer].  The Atkins protein bars and shakes are available in more varieties at [employer], [employer] and [employer].     7 AM Breakfast:  Choose from the following:  Low carbohydrate Protein  Shakes (I recommend the EAS AdvantEdge "Carb Control" shakes  Or the low carb shakes by Atkins.    2.5 carbs   Arnold's "Sandwhich Thin"toasted  w/ peanut butter (no jelly: about 20 net carbs  "Bagel Thin" with cream cheese and salmon: about 20 carbs   a scrambled egg/bacon/cheese burrito made with Mission's "carb balance" whole wheat tortilla  (about 10 net carbs )   Avoid cereal and bananas, oatmeal and cream of wheat and grits. They are loaded with carbohydrates!   10 AM: high protein snack  Protein bar by Atkins (the snack size, under 200 cal, usually < 6 net carbs).    A stick of cheese:  Around 1 carb,  100 cal     Dannon Light n Fit [place] Yogurt  (80 cal, 8 carbs)  Other so called "protein bars" and Greek yogurts tend to be loaded with carbohydrates.  Remember, in food advertising, the word "energy" is synonymous for " carbohydrate."  Lunch:   A Sandwich using the bread choices listed, Can use any  Eggs,  lunchmeat, grilled meat or canned tuna), avocado, regular mayo/mustard  and cheese.  A Salad using clue cheese, ranch,  Goddess or vinagrette,  No croutons or "confetti" and no "candied nuts" but regular nuts OK.   No pretzels or chips.  Pickles and miniature sweet peppers are a good low carb alternative  The bread is the only source or carbohydrate that can be decreased (Joseph's makes a pita bread and a flat bread that are 50 cal and 4 net carbs ; Toufayan makes a low carb flatbread that's 100 cal and 9 net carbs  and  Mission's carb balance whole wheat tortilla  That is 210 cal and 6 net carbs) Avoid "Low fat dressings, as well as Reyne Dumas and [address] dressings They are loaded with sugar!   3 PM/ Mid day  Snack:  Consider  1 ounce of  almonds, walnuts, pistachios, pecans, peanuts,  Macadamia nuts or a nut medley.  Avoid "granola"; the dried cranberries and raisins are loaded with carbohydrates. Mixed nuts ok if no raisins or cranberries or dried fruit.     6 PM  Dinner:    "mean and green, "  Meat/chicken/fish with a green salad, and broccoli, cauliflower, green beans, spinach, brussel sprouts or  Lima beans::       There is a low carb pasta by Dreamfield's available at [employer] that is acceptable and tastes great only 5 diestible carbs/serving.   Try Michel Angelo's chicken piccata or chicken or eggplant parm over low carb pasta.   Clifton Custard Sanchez's "Carnitas" (pulled pork, no sauce,  0  carbs) or his beef pot roast to make a dinner burrito  Whole wheat pasta is still full of digestible carbs and  Not as low in glycemic index as Dreamfield's.   Jenne rice is still rice,  So skip the rice and noodles if you eat [name] or [place]  9 PM snack :   Breyer's "low carb" fudgsicle or  ice cream bar (Carb Smart line), or  Weight Watcher's ice cream bar , or another "no sugar added" ice cream;  a serving of fresh berries/cherries with whipped cream   Cheese or yoguty  Avoid bananas, pineapple, grapes  and watermelon on a regular basis because they are high in sugar)   Remember that snack Substitutions should be less than 10 carbs per serving and meals < 20 carbs. Remember to subtract fiber grams to get the "net carbs."

## 2012-10-06 NOTE — Assessment & Plan Note (Signed)
Give her hypersomnolence, CAD and COPD, I suspect that she has sleep apnea or nocturnal hypoxia from other causes.  Ordering sleep study.

## 2012-10-07 ENCOUNTER — Telehealth: Payer: Self-pay | Admitting: Internal Medicine

## 2012-10-07 ENCOUNTER — Encounter: Payer: Self-pay | Admitting: Internal Medicine

## 2012-10-07 DIAGNOSIS — R29898 Other symptoms and signs involving the musculoskeletal system: Secondary | ICD-10-CM | POA: Insufficient documentation

## 2012-10-07 LAB — COMPREHENSIVE METABOLIC PANEL
ALT: 11 U/L (ref 0–35)
AST: 12 U/L (ref 0–37)
Albumin: 3.8 g/dL (ref 3.5–5.2)
Alkaline Phosphatase: 76 U/L (ref 39–117)
BUN: 17 mg/dL (ref 6–23)
CO2: 27 mEq/L (ref 19–32)
Calcium: 9.2 mg/dL (ref 8.4–10.5)
Chloride: 100 mEq/L (ref 96–112)
Creatinine, Ser: 1 mg/dL (ref 0.4–1.2)
GFR: 59.22 mL/min — ABNORMAL LOW (ref >60.00)
Glucose, Bld: 269 mg/dL — ABNORMAL HIGH (ref 70–99)
Potassium: 4.1 mEq/L (ref 3.5–5.1)
Sodium: 135 mEq/L (ref 135–145)
Total Bilirubin: 0.4 mg/dL (ref 0.3–1.2)
Total Protein: 6.9 g/dL (ref 6.0–8.3)

## 2012-10-07 LAB — TSH: TSH: 0.17 u[IU]/mL — ABNORMAL LOW (ref 0.35–5.50)

## 2012-10-07 LAB — HEMOGLOBIN A1C: Hgb A1c MFr Bld: 9.9 % — ABNORMAL HIGH (ref 4.6–6.5)

## 2012-10-07 MED ORDER — LEVOTHYROXINE SODIUM 112 MCG PO TABS: 112.0000 ug | ORAL_TABLET | Freq: Every day | ORAL | Status: DC

## 2012-10-07 NOTE — Assessment & Plan Note (Signed)
TSH was overactive on 125 mcg dose. Dose has been  Reduced  to 112 mcg.

## 2012-10-07 NOTE — Addendum Note (Signed)
Addended by: Sherlene Shams on: 10/07/2012 05:46 PM   Modules accepted: Orders, Medications

## 2012-10-07 NOTE — Assessment & Plan Note (Signed)
She has a prior history of lumbar back surgery with fusion. Plain films in 2012 showed narrowing of the space at L2-L3 and intact hardware from L3 to S1. will consider MRI of lumbar spine for further evaluation. Continue judicious use of Vicodin as needed for pain.

## 2012-10-07 NOTE — Telephone Encounter (Signed)
Her diabetes control has improved but she still is still very uncontrolled. Given her problems maintaining a diabetic diet I am referring her to [facility] pharmacy further diabetes education program. I strongly recommend her family do with her.  I am also trying to get an MRI of her lumbar spine ordered, however we may be unable to do this because of her recent stent placement. If that is the case I will be referring her to neurology for EMG nerve conduction studies which will help [place] determine why her legs are so weak.

## 2012-10-07 NOTE — Assessment & Plan Note (Addendum)
Uncontrolled.  diabetes control has improved from a hemoglobin A1c of 14.6 to currently 9.9. All previous attempts to maintain and improve diabetes control have been  hindered by lack of  Available family support and patient's vascular dementia.  Her husband has PTSD and cannot be counted on to help monitor her diabetes. Her daughter does which she can but she has run family to take care of. Referral to diabetes education will again be attempted.

## 2012-10-07 NOTE — Assessment & Plan Note (Signed)
Her neurologic exam today is notable for left greater than right leg weakness with decreased patellar and Achilles reflexes on the left side. She has a history of chronic low back pain with no prior lumbar MRI done. She had a cardiac stent placed in early February, which may preclude MRI imaging at this time. If this is the case we will refer to neurology for EMG/nerve conduction studies.

## 2012-10-08 ENCOUNTER — Encounter: Payer: Self-pay | Admitting: General Practice

## 2012-10-08 NOTE — Telephone Encounter (Signed)
Pt.notified

## 2012-10-14 ENCOUNTER — Other Ambulatory Visit: Payer: Self-pay | Admitting: Internal Medicine

## 2012-10-14 ENCOUNTER — Ambulatory Visit: Payer: Self-pay | Admitting: Internal Medicine

## 2012-10-14 DIAGNOSIS — M545 Low back pain, unspecified: Secondary | ICD-10-CM | POA: Diagnosis not present

## 2012-10-14 DIAGNOSIS — Z09 Encounter for follow-up examination after completed treatment for conditions other than malignant neoplasm: Secondary | ICD-10-CM | POA: Diagnosis not present

## 2012-10-14 DIAGNOSIS — R29898 Other symptoms and signs involving the musculoskeletal system: Secondary | ICD-10-CM | POA: Diagnosis not present

## 2012-10-14 DIAGNOSIS — Z9889 Other specified postprocedural states: Secondary | ICD-10-CM | POA: Diagnosis not present

## 2012-10-14 DIAGNOSIS — G589 Mononeuropathy, unspecified: Secondary | ICD-10-CM | POA: Diagnosis not present

## 2012-10-18 ENCOUNTER — Telehealth: Payer: Self-pay | Admitting: Internal Medicine

## 2012-10-18 DIAGNOSIS — R29898 Other symptoms and signs involving the musculoskeletal system: Secondary | ICD-10-CM

## 2012-10-18 NOTE — Telephone Encounter (Signed)
Her MRI of the lumbar spine showed a significant disc bulge at the L2 level which may be causing her persistent back pain I recommended that she see a neurosurgeon. You'll need to communicate this to her husband because she has dementia and often does not keep appointments. Make sure that they are willing to be seen by  neurosurgeon and whether they have a preference

## 2012-10-18 NOTE — Telephone Encounter (Signed)
Pt has an appt with Dr. Malvin Johns on 4/8.

## 2012-10-19 ENCOUNTER — Encounter: Payer: Self-pay | Admitting: Cardiovascular Disease

## 2012-10-26 ENCOUNTER — Telehealth: Payer: Self-pay | Admitting: General Practice

## 2012-10-26 DIAGNOSIS — R413 Other amnesia: Secondary | ICD-10-CM | POA: Diagnosis not present

## 2012-10-26 DIAGNOSIS — G478 Other sleep disorders: Secondary | ICD-10-CM | POA: Diagnosis not present

## 2012-10-26 DIAGNOSIS — M6281 Muscle weakness (generalized): Secondary | ICD-10-CM | POA: Diagnosis not present

## 2012-10-26 DIAGNOSIS — IMO0001 Reserved for inherently not codable concepts without codable children: Secondary | ICD-10-CM | POA: Diagnosis not present

## 2012-10-26 NOTE — Telephone Encounter (Signed)
Pt had been called in regards to refusal to schedule with [facility] in regards to her diabetes. Waiting for pt to return call to advise whether or not she would like to attend.

## 2012-10-27 NOTE — Telephone Encounter (Signed)
Pt stats she will call when she is ready to attend the classes.

## 2012-10-30 ENCOUNTER — Other Ambulatory Visit: Payer: Self-pay | Admitting: Internal Medicine

## 2012-10-31 MED ORDER — LEVOTHYROXINE SODIUM 112 MCG PO TABS: 112.0000 ug | ORAL_TABLET | Freq: Every day | ORAL | Status: DC

## 2012-10-31 NOTE — Telephone Encounter (Signed)
She has submitted the wrong levothyroxine dose for refill.  Her dose was reduced to 112 mcg after last lab checked showed that her thyroid was overactie on 125 mcg dose. Patient has early dementia and family is supposed to be supervising her medications.  Please notify husband. i have sent the corrected refill to pharmacy

## 2012-10-31 NOTE — Addendum Note (Signed)
Addended by: Sherlene Shams on: 10/31/2012 08:12 AM   Modules accepted: Orders

## 2012-11-01 NOTE — Telephone Encounter (Signed)
Patient notified as instructed and patient has already picked up new medication.

## 2012-11-08 ENCOUNTER — Other Ambulatory Visit: Payer: Self-pay | Admitting: *Deleted

## 2012-11-08 MED ORDER — HYDROCODONE-ACETAMINOPHEN 5-325 MG PO TABS: 1.0000 | ORAL_TABLET | Freq: Four times a day (QID) | ORAL | Status: DC | PRN

## 2012-11-08 NOTE — Telephone Encounter (Signed)
Pt called requesting RF on Hydrocodone. Please advise.

## 2012-11-08 NOTE — Telephone Encounter (Signed)
Ok to refill,  Authorized in epic 

## 2012-11-09 NOTE — Telephone Encounter (Signed)
Phoned Rx into pharmacy

## 2012-11-13 ENCOUNTER — Other Ambulatory Visit: Payer: Self-pay | Admitting: Internal Medicine

## 2012-11-16 ENCOUNTER — Other Ambulatory Visit: Payer: Self-pay | Admitting: *Deleted

## 2012-11-16 MED ORDER — ENALAPRIL MALEATE 10 MG PO TABS: 10.0000 mg | ORAL_TABLET | Freq: Every day | ORAL | Status: DC

## 2012-11-17 NOTE — Telephone Encounter (Signed)
Script faxed to pharmacy

## 2012-11-18 ENCOUNTER — Encounter: Payer: Self-pay | Admitting: Internal Medicine

## 2012-12-07 ENCOUNTER — Other Ambulatory Visit: Payer: Self-pay | Admitting: *Deleted

## 2012-12-07 ENCOUNTER — Other Ambulatory Visit: Payer: Self-pay | Admitting: Internal Medicine

## 2012-12-07 NOTE — Telephone Encounter (Signed)
Pt has refills available, confirmed with pharmacy

## 2012-12-08 MED ORDER — AMITRIPTYLINE HCL 150 MG PO TABS: ORAL_TABLET | ORAL | Status: DC

## 2012-12-08 NOTE — Telephone Encounter (Signed)
Rx sent to pharmacy by escript  

## 2012-12-13 ENCOUNTER — Other Ambulatory Visit: Payer: Self-pay | Admitting: Internal Medicine

## 2012-12-22 ENCOUNTER — Other Ambulatory Visit: Payer: Self-pay | Admitting: *Deleted

## 2012-12-23 ENCOUNTER — Other Ambulatory Visit: Payer: Self-pay | Admitting: *Deleted

## 2012-12-28 ENCOUNTER — Other Ambulatory Visit: Payer: Self-pay | Admitting: *Deleted

## 2013-01-04 MED ORDER — CYANOCOBALAMIN 1000 MCG/ML IJ SOLN: 1000.0000 ug | Freq: Once | INTRAMUSCULAR | Status: DC

## 2013-01-18 ENCOUNTER — Ambulatory Visit (INDEPENDENT_AMBULATORY_CARE_PROVIDER_SITE_OTHER): Payer: Medicare Other | Admitting: Adult Health

## 2013-01-18 ENCOUNTER — Encounter: Payer: Self-pay | Admitting: Adult Health

## 2013-01-18 VITALS — BP 100/66 | HR 73 | Temp 97.8°F | Resp 12 | Wt 153.0 lb

## 2013-01-18 DIAGNOSIS — L940 Localized scleroderma [morphea]: Secondary | ICD-10-CM

## 2013-01-18 DIAGNOSIS — R5381 Other malaise: Secondary | ICD-10-CM

## 2013-01-18 DIAGNOSIS — L900 Lichen sclerosus et atrophicus: Secondary | ICD-10-CM

## 2013-01-18 DIAGNOSIS — G4730 Sleep apnea, unspecified: Secondary | ICD-10-CM

## 2013-01-18 DIAGNOSIS — R946 Abnormal results of thyroid function studies: Secondary | ICD-10-CM | POA: Diagnosis not present

## 2013-01-18 DIAGNOSIS — R7989 Other specified abnormal findings of blood chemistry: Secondary | ICD-10-CM

## 2013-01-18 DIAGNOSIS — R5383 Other fatigue: Secondary | ICD-10-CM

## 2013-01-18 NOTE — Assessment & Plan Note (Signed)
Will evaluate reason for her fatigue. Order sleep study and check TSH

## 2013-01-18 NOTE — Assessment & Plan Note (Signed)
Given the long history with problems and no resolution with current medication, will refer patient to GYN

## 2013-01-18 NOTE — Progress Notes (Signed)
Subjective:    Patient ID: Sarah Payne, female    DOB: March 26, 1946, 67 y.o.   MRN: [id number]  HPI  Patient is a pleasant 67 year old female who presents to clinic with the following concerns:  1. Fatigue - patient reports feeling tired almost immediately after she wakes up. Her husband is with her during the visit and he reports that he has observed patient with episodes of sleep apnea. She has been offered to have a sleep study in the past but had not quite decided on doing this. She is agreeable to evaluate if part of her problem is attributable to sleep apnea. Patient is smoking less than one pack per day. She recently had abnormal TSH. Her levothyroxine level was changed.  2. Burning, irritation, itching of the genitalia. She reports extreme discomfort when urine comes in contact with skin. She has been applying clobetasol for a little over one year without significant improvement. This is becoming extremely painful especially during intercourse. Patient has a hx of lichen sclerosus.  Current Outpatient Prescriptions on File Prior to Visit  Medication Sig Dispense Refill  . amitriptyline (ELAVIL) 150 MG tablet TAKE 1 HOUR BEFORE BEDTIME EVERY NIGHT FOR INSOMNIA  30 tablet  2  . aspirin 81 MG tablet Take 81 mg by mouth daily.        . Calcium & Magnesium Carbonates (MYLANTA PO) Take by mouth.        . carvedilol (COREG) 6.25 MG tablet Take 1 tablet (6.25 mg total) by mouth 2 (two) times daily.  180 tablet  3  . clopidogrel (PLAVIX) 75 MG tablet TAKE 1 TABLET (75 MG TOTAL) BY MOUTH DAILY.  90 tablet  3  . cyanocobalamin (,VITAMIN B-12,) 1000 MCG/ML injection Inject 1 mL (1,000 mcg total) into the muscle once.  1 mL  1  . donepezil (ARICEPT) 5 MG tablet Take 1 tablet (5 mg total) by mouth at bedtime.  90 tablet  3  . DULoxetine (CYMBALTA) 60 MG capsule Take 1 capsule (60 mg total) by mouth daily.  30 capsule  2  . enalapril (VASOTEC) 10 MG tablet Take 1 tablet (10 mg total) by mouth daily.   90 tablet  1  . glipiZIDE (GLUCOTROL) 10 MG tablet Before breakfast and supper (evening meal)  180 tablet  3  . glucose blood (FREESTYLE LITE) test strip Check blood sugar 2 times daily.  200 each  3  . glucose monitoring kit (FREESTYLE) monitoring kit 1 each by Does not apply route as needed for other. PHARMACY PLEASE SUBSTITUTE WHATEVER GLUCOMETER YOU CARRY  1 each  1  . HYDROcodone-acetaminophen (NORCO/VICODIN) 5-325 MG per tablet Take 1 tablet by mouth every 6 (six) hours as needed for pain.  90 tablet  1  . insulin detemir (LEVEMIR FLEXPEN) 100 UNIT/ML injection Inject 30 Units into the skin at bedtime.  45 mL  3  . isosorbide mononitrate (IMDUR) 30 MG 24 hr tablet Take 1 tablet (30 mg total) by mouth daily.  90 tablet  3  . levothyroxine (SYNTHROID, LEVOTHROID) 112 MCG tablet Take 1 tablet (112 mcg total) by mouth daily.  90 tablet  3  . magnesium oxide (MAG-OX) 400 MG tablet Take 400 mg by mouth daily.        . nitroGLYCERIN (NITROSTAT) 0.4 MG SL tablet Place 1 tablet (0.4 mg total) under the tongue every 5 (five) minutes as needed.  30 tablet  1  . ondansetron (ZOFRAN-ODT) 8 MG disintegrating tablet Take 1 tablet (8  mg total) by mouth every 8 (eight) hours as needed for nausea.  20 tablet  0  . pravastatin (PRAVACHOL) 80 MG tablet TAKE 1 TABLET BY MOUTH EVERY DAY  90 tablet  3  . sitaGLIPtin (JANUVIA) 100 MG tablet Take 1 tablet (100 mg total) by mouth daily.  90 tablet  3  . traZODone (DESYREL) 100 MG tablet TAKE 1 TABLET (100 MG TOTAL) BY MOUTH AT BEDTIME.  90 tablet  2  . zolpidem (AMBIEN) 5 MG tablet Take 2 tablets (10 mg total) by mouth at bedtime as needed for sleep.  180 tablet  3  . clobetasol ointment (TEMOVATE) 0.05 % APPLY EVERY NIGHT TO VAGINAL FOLDS  30 g  1   No current facility-administered medications on file prior to visit.     Review of Systems  Constitutional: Positive for fatigue. Negative for fever and chills.  HENT: Negative.   Eyes: Negative.   Respiratory:  Negative.   Cardiovascular: Negative.   Gastrointestinal: Negative.   Genitourinary:       Pain, itching and burning on external genitalia  Neurological: Negative.   Psychiatric/Behavioral: Negative.        Objective:   Physical Exam  Constitutional: She is oriented to person, place, and time. She appears well-developed and well-nourished. No distress.  HENT:  Head: Normocephalic and atraumatic.  Right Ear: External ear normal.  Left Ear: External ear normal.  Nose: Nose normal.  Mouth/Throat: Oropharynx is clear and moist.  Cardiovascular: Normal rate, regular rhythm and normal heart sounds.  Exam reveals no gallop.   No murmur heard. Pulmonary/Chest: Effort normal and breath sounds normal. No respiratory distress. She has no wheezes. She has no rales.  Musculoskeletal: Normal range of motion. She exhibits no edema.  Lymphadenopathy:    She has no cervical adenopathy.  Neurological: She is alert and oriented to person, place, and time.  Skin: Skin is warm and dry.  Psychiatric: She has a normal mood and affect. Her behavior is normal. Judgment and thought content normal.          Assessment & Plan:

## 2013-01-19 ENCOUNTER — Ambulatory Visit: Payer: Self-pay | Admitting: Internal Medicine

## 2013-01-19 DIAGNOSIS — R0609 Other forms of dyspnea: Secondary | ICD-10-CM | POA: Diagnosis not present

## 2013-01-19 DIAGNOSIS — G4734 Idiopathic sleep related nonobstructive alveolar hypoventilation: Secondary | ICD-10-CM | POA: Diagnosis not present

## 2013-01-19 DIAGNOSIS — G2589 Other specified extrapyramidal and movement disorders: Secondary | ICD-10-CM | POA: Diagnosis not present

## 2013-01-19 DIAGNOSIS — I2510 Atherosclerotic heart disease of native coronary artery without angina pectoris: Secondary | ICD-10-CM | POA: Diagnosis not present

## 2013-01-19 DIAGNOSIS — R0902 Hypoxemia: Secondary | ICD-10-CM | POA: Diagnosis not present

## 2013-01-19 DIAGNOSIS — J42 Unspecified chronic bronchitis: Secondary | ICD-10-CM | POA: Diagnosis not present

## 2013-01-19 DIAGNOSIS — G2581 Restless legs syndrome: Secondary | ICD-10-CM | POA: Diagnosis not present

## 2013-01-19 DIAGNOSIS — G4733 Obstructive sleep apnea (adult) (pediatric): Secondary | ICD-10-CM | POA: Diagnosis not present

## 2013-01-19 LAB — TSH: TSH: 18.48 u[IU]/mL — ABNORMAL HIGH (ref 0.35–5.50)

## 2013-01-20 ENCOUNTER — Other Ambulatory Visit: Payer: Self-pay | Admitting: Adult Health

## 2013-01-20 ENCOUNTER — Other Ambulatory Visit: Payer: Self-pay | Admitting: *Deleted

## 2013-01-20 DIAGNOSIS — E039 Hypothyroidism, unspecified: Secondary | ICD-10-CM

## 2013-01-20 MED ORDER — HYDROCODONE-ACETAMINOPHEN 5-325 MG PO TABS: 1.0000 | ORAL_TABLET | Freq: Four times a day (QID) | ORAL | Status: DC | PRN

## 2013-01-20 MED ORDER — LEVOTHYROXINE SODIUM 137 MCG PO CAPS: 137.0000 ug | ORAL_CAPSULE | Freq: Every day | ORAL | Status: DC

## 2013-01-20 NOTE — Progress Notes (Signed)
  TSH 18.48 on 112 mcg levothyroxine. Increase dose to 137 mcg daily. Recheck TSH in 6 weeks.

## 2013-01-20 NOTE — Telephone Encounter (Signed)
Rx called to pharmacy

## 2013-01-20 NOTE — Addendum Note (Signed)
Addended by: Sherlene Shams on: 01/20/2013 04:43 PM   Modules accepted: Orders

## 2013-01-20 NOTE — Telephone Encounter (Signed)
Called pt to advise results of TSH and change in levothyroxine dose. Pt requesting refill on Hydrocodone. Ok?

## 2013-01-20 NOTE — Telephone Encounter (Signed)
Ok to refill,  Authorized in epic 

## 2013-01-20 NOTE — Telephone Encounter (Signed)
Pt notified. Rx called to pharmacy. 

## 2013-01-24 ENCOUNTER — Ambulatory Visit: Payer: Medicare Other | Admitting: Internal Medicine

## 2013-02-02 DIAGNOSIS — B373 Candidiasis of vulva and vagina: Secondary | ICD-10-CM | POA: Diagnosis not present

## 2013-02-02 DIAGNOSIS — N760 Acute vaginitis: Secondary | ICD-10-CM | POA: Diagnosis not present

## 2013-02-03 DIAGNOSIS — N898 Other specified noninflammatory disorders of vagina: Secondary | ICD-10-CM | POA: Diagnosis not present

## 2013-02-09 ENCOUNTER — Ambulatory Visit: Payer: Self-pay | Admitting: Internal Medicine

## 2013-02-09 DIAGNOSIS — G4761 Periodic limb movement disorder: Secondary | ICD-10-CM | POA: Diagnosis not present

## 2013-02-09 DIAGNOSIS — G4733 Obstructive sleep apnea (adult) (pediatric): Secondary | ICD-10-CM | POA: Diagnosis not present

## 2013-02-09 DIAGNOSIS — R0609 Other forms of dyspnea: Secondary | ICD-10-CM | POA: Diagnosis not present

## 2013-02-09 DIAGNOSIS — R0989 Other specified symptoms and signs involving the circulatory and respiratory systems: Secondary | ICD-10-CM | POA: Diagnosis not present

## 2013-02-16 ENCOUNTER — Encounter: Payer: Medicare Other | Admitting: Internal Medicine

## 2013-02-25 ENCOUNTER — Encounter: Payer: Self-pay | Admitting: Internal Medicine

## 2013-02-25 ENCOUNTER — Ambulatory Visit (INDEPENDENT_AMBULATORY_CARE_PROVIDER_SITE_OTHER): Payer: Medicare Other | Admitting: Internal Medicine

## 2013-02-25 VITALS — BP 100/66 | HR 70 | Temp 97.4°F | Resp 14 | Ht 66.0 in | Wt 154.5 lb

## 2013-02-25 DIAGNOSIS — E1159 Type 2 diabetes mellitus with other circulatory complications: Secondary | ICD-10-CM

## 2013-02-25 DIAGNOSIS — E039 Hypothyroidism, unspecified: Secondary | ICD-10-CM

## 2013-02-25 DIAGNOSIS — F17200 Nicotine dependence, unspecified, uncomplicated: Secondary | ICD-10-CM

## 2013-02-25 DIAGNOSIS — Z720 Tobacco use: Secondary | ICD-10-CM

## 2013-02-25 DIAGNOSIS — L940 Localized scleroderma [morphea]: Secondary | ICD-10-CM

## 2013-02-25 DIAGNOSIS — E559 Vitamin D deficiency, unspecified: Secondary | ICD-10-CM

## 2013-02-25 DIAGNOSIS — E1151 Type 2 diabetes mellitus with diabetic peripheral angiopathy without gangrene: Secondary | ICD-10-CM

## 2013-02-25 DIAGNOSIS — L900 Lichen sclerosus et atrophicus: Secondary | ICD-10-CM

## 2013-02-25 DIAGNOSIS — I739 Peripheral vascular disease, unspecified: Secondary | ICD-10-CM

## 2013-02-25 DIAGNOSIS — I509 Heart failure, unspecified: Secondary | ICD-10-CM

## 2013-02-25 DIAGNOSIS — G4733 Obstructive sleep apnea (adult) (pediatric): Secondary | ICD-10-CM

## 2013-02-25 DIAGNOSIS — I5022 Chronic systolic (congestive) heart failure: Secondary | ICD-10-CM

## 2013-02-25 DIAGNOSIS — J449 Chronic obstructive pulmonary disease, unspecified: Secondary | ICD-10-CM

## 2013-02-25 DIAGNOSIS — R5381 Other malaise: Secondary | ICD-10-CM

## 2013-02-25 DIAGNOSIS — Z1239 Encounter for other screening for malignant neoplasm of breast: Secondary | ICD-10-CM

## 2013-02-25 DIAGNOSIS — E785 Hyperlipidemia, unspecified: Secondary | ICD-10-CM

## 2013-02-25 DIAGNOSIS — Z9119 Patient's noncompliance with other medical treatment and regimen: Secondary | ICD-10-CM

## 2013-02-25 DIAGNOSIS — K2970 Gastritis, unspecified, without bleeding: Secondary | ICD-10-CM

## 2013-02-25 DIAGNOSIS — R5383 Other fatigue: Secondary | ICD-10-CM

## 2013-02-25 LAB — COMPREHENSIVE METABOLIC PANEL
ALT: 14 U/L (ref 0–35)
AST: 14 U/L (ref 0–37)
Albumin: 3.9 g/dL (ref 3.5–5.2)
Alkaline Phosphatase: 72 U/L (ref 39–117)
BUN: 13 mg/dL (ref 6–23)
CO2: 26 mEq/L (ref 19–32)
Calcium: 9.6 mg/dL (ref 8.4–10.5)
Chloride: 91 mEq/L — ABNORMAL LOW (ref 96–112)
Creatinine, Ser: 1.2 mg/dL (ref 0.4–1.2)
GFR: 49.83 mL/min — ABNORMAL LOW (ref >60.00)
Glucose, Bld: 320 mg/dL — ABNORMAL HIGH (ref 70–99)
Potassium: 4.4 mEq/L (ref 3.5–5.1)
Sodium: 131 mEq/L — ABNORMAL LOW (ref 135–145)
Total Bilirubin: 0.3 mg/dL (ref 0.3–1.2)
Total Protein: 7.1 g/dL (ref 6.0–8.3)

## 2013-02-25 LAB — CBC WITH DIFFERENTIAL/PLATELET
Basophils Absolute: 0 10*3/uL (ref 0.0–0.1)
Basophils Relative: 0.4 % (ref 0.0–3.0)
Eosinophils Absolute: 0.1 10*3/uL (ref 0.0–0.7)
Eosinophils Relative: 0.6 % (ref 0.0–5.0)
HCT: 41.6 % (ref 36.0–46.0)
Hemoglobin: 13.7 g/dL (ref 12.0–15.0)
Lymphocytes Relative: 24.1 % (ref 12.0–46.0)
Lymphs Abs: 2.3 10*3/uL (ref 0.7–4.0)
MCHC: 32.9 g/dL (ref 30.0–36.0)
MCV: 96.5 fl (ref 78.0–100.0)
Monocytes Absolute: 0.6 10*3/uL (ref 0.1–1.0)
Monocytes Relative: 6 % (ref 3.0–12.0)
Neutro Abs: 6.5 10*3/uL (ref 1.4–7.7)
Neutrophils Relative %: 68.9 % (ref 43.0–77.0)
Platelets: 311 10*3/uL (ref 150.0–400.0)
RBC: 4.31 Mil/uL (ref 3.87–5.11)
RDW: 13.5 % (ref 11.5–14.6)
WBC: 9.5 10*3/uL (ref 4.5–10.5)

## 2013-02-25 LAB — HEMOGLOBIN A1C: Hgb A1c MFr Bld: 12.6 % — ABNORMAL HIGH (ref 4.6–6.5)

## 2013-02-25 LAB — LIPID PANEL
Cholesterol: 212 mg/dL — ABNORMAL HIGH (ref 0–200)
HDL: 37.4 mg/dL — ABNORMAL LOW (ref >39.00)
Total CHOL/HDL Ratio: 6
Triglycerides: 572 mg/dL — ABNORMAL HIGH (ref 0.0–149.0)
VLDL: 114.4 mg/dL — ABNORMAL HIGH (ref 0.0–40.0)

## 2013-02-25 LAB — TSH: TSH: 5.03 u[IU]/mL (ref 0.35–5.50)

## 2013-02-25 LAB — LDL CHOLESTEROL, DIRECT: Direct LDL: 111 mg/dL

## 2013-02-25 MED ORDER — FLUTICASONE PROPIONATE HFA 220 MCG/ACT IN AERO: 1.0000 | INHALATION_SPRAY | Freq: Two times a day (BID) | RESPIRATORY_TRACT | Status: DC

## 2013-02-25 NOTE — Progress Notes (Addendum)
Patient ID: Sarah Payne, female   DOB: [birth year], 67 y.o.   MRN: [id number]  Patient Active Problem List   Diagnosis Date Noted  . Noncompliance with diabetes treatment 02/27/2013  . Obstructive sleep apnea 02/25/2013  . Leg weakness, bilateral 10/07/2012  . Other malaise and fatigue 10/06/2012  . Chronic systolic congestive heart failure 05/21/2012  . Lichen sclerosus et atrophicus 05/20/2012  . Bronchitis, chronic obstructive 05/20/2012  . Headache 10/15/2011  . Angina at rest 09/25/2011  . Diabetes mellitus type 2 with peripheral artery disease 08/28/2011  . History of lumbar discectomy 06/29/2011  . Depression 06/24/2011  . Hypothyroidism 05/03/2011  . CAD, multiple vessel 02/24/2011  . Tobacco abuse 02/24/2011  . Hyperlipidemia 02/24/2011    Subjective:  CC:   Chief Complaint  Patient presents with  . Annual Exam    HPI:   Sarah Payne a 67 y.o. female who presents for her  "annual exam' but was overdue for follow up on chronic issues including vascular dementia, uncontrolled DM Type 2, CAD with prior MI and interventions,  ongoing tobacco abuse and chronic back pain with spinal stenosis suggested by lumbar MRI . She was last seen by me in March, at which time her reports of persistent fatigue resulted in a referral for sleep study and cardiopulmonary rehab program.  She did  not keep the sleep study appt and only attended the orientation rehab appt .  The . Her diabetes was uncontrolled and she was not checking her blood sugars. [facility]. Per  the report I received from [facility] from their singular visit to the home in May, at which time her blood sugar was over 400, they advised patient to  "Make an urgent appt to see me rather than wait for her scheduled appt."  She did not do so and actually  missed her last appointment. However she did see Orville Govern, NP on July 1 and finally agreed to have a sleep study which noted mild sleep apnea and  persistent significant nocturnal hypoxemia with arrhythmias. Patient was fitted for CPAP mask during study and felt significantly more refreshed upon waking after 4 hours of use of CPAP shoe. She is very anxious to start using CPAP.  She is accompanied by her husband today .  Her cc today is "constant heartburn",  patient states that the symptoms are occurring frequently and persist unless she takes the Prevacid which she has been using recently. She denies chest pressure which she is accustomed to having with exertion and with prior admissions for myocardial ischemia.  2) Her second complaint is chronic shortness of breath with minimal exertion. She was referred to cardiopulmonary rehabilitation in March but only went to the orientation due to being exhausted after minimal exertion. She has been advised on multiple occasions to quit smoking given her known coronary artery disease.    She has not had prior pulmonary function testing despite prior referrals.  She is using the  E cigarette which has allowed her to reduce her tobacco abuse to an average of 10 cigarettes a day .     3) DM:  She continues to have elevated blood sugars at home. She has not brought her glucometer or her sugar log with her. She states that her Fasting blood sugar was 130 today but her post prandial sugars remain elevated unless she uses insulin. Her dietary compliance is poor. It is unclear whether she takes her medications as directed. Her husband  is not involved in medication administration admittedly due to his own psychiatric problems.    Past Medical History  Diagnosis Date  . Diabetes mellitus   . Hypertension   . Hypothyroidism   . Depression   . Vascular dementia   . Tobacco abuse     a. 45 pack year history - quit 09/16/11  . Chronic low back pain   . Coronary artery disease     a. s/p PCI/DES LCX x 3;  b. NSTEMI 09/16/11;  c.   09/17/11 Cath [facility]) Severe RCA/LCX dzs - RCA treated, LCX intervention pending w/ Dr.  Juliann Pares    Past Surgical History  Procedure Laterality Date  . Back surgery    . Abdominal hysterectomy    . Appendectomy    . Cardiac catheterization  2013  . Coronary angioplasty  2012    stent x 3   . Coronary angioplasty with stent placement  2013       The following portions of the patient's history were reviewed and updated as appropriate: Allergies, current medications, and problem list.    Review of Systems:   12 Pt  review of systems was negative except those addressed in the HPI,     History   Social History  . Marital Status: Married    Spouse Name: N/A    Number of Children: N/A  . Years of Education: N/A   Occupational History  . Not on file.   Social History Main Topics  . Smoking status: Current Every Day Smoker -- 0.50 packs/day    Types: Cigarettes  . Smokeless tobacco: Never Used     Comment: Has cut back, trying to quit.  . Alcohol Use: No  . Drug Use: No  . Sexually Active: Not on file   Other Topics Concern  . Not on file   Social History Narrative   Lives at home with her husband in [place].  Previously used marijuana - quit.    Objective:  Filed Vitals:   02/25/13 1104  BP: 100/66  Pulse: 70  Temp: 97.4 F (36.3 C)  Resp: 14     General appearance: alert, cooperative and appears stated age Ears: normal TM's and external ear canals both ears Throat: lips, mucosa, and tongue normal; teeth and gums normal Neck: no adenopathy, no carotid bruit, supple, symmetrical, trachea midline and thyroid not enlarged, symmetric, no tenderness/mass/nodules Back: symmetric, no curvature. ROM normal. No CVA tenderness. Lungs: clear to auscultation bilaterally Heart: regular rate and rhythm, S1, S2 normal, no murmur, click, rub or gallop Abdomen: soft, non-tender; bowel sounds normal; no masses,  no organomegaly Pulses: 2+ and symmetric Skin: Skin color, texture, turgor normal. No rashes or lesions Lymph nodes: Cervical,  supraclavicular, and axillary nodes normal.  Assessment and Plan:  Tobacco abuse Smoking cessation instruction/counseling given:  counseled patient on the dangers of tobacco use, advised patient to stop smoking, and reviewed strategies to maximize success. Her barriers to successful quitting including her dementia, and lack of family support.  Obstructive sleep apnea CPAP with supplemental oxygen has been ordered.  Lichen sclerosus et atrophicus Patient has been referred to GYN since her s49mptoms have not resolved with clobetasol.   Hypothyroidism Difficult to maintain therapeutic thyroid function due to  noncompliance with medication secondary to dementia.. Her dose was changed recently and repeat TSH is just within normal limits. No changes today.  Hyperlipidemia Her statin needs to be changed to atorvastatin .  I suspect her triglycerides are elevated because  she has not fasted. We will repeat in 6 weeks. If triglycerides are still elevated at 6 weeks we will consider changing her to Crestor and fenofibrate/TriCor  Diabetes mellitus type 2 with peripheral artery disease Uncontrolled despite addition of Levemir at last visit. A1c is back up to 12.6  She was referred to diabetes education at last visit but did not go.  Again her lack of family support and concurrent dementia are significant barriers to control. I made a referral to [facility] after her last visit for application of available community support measures but they have been ineffective. I will change her insulin today to NPH using  30 units twice daily to start with and will see her back every two weeks  until she can be seen by our endocrinology.. Continue oral medications.  Noncompliance with diabetes treatment Patient is being referred to Dr. Lafe Garin for diabetes management.   Bronchitis, chronic obstructive Pulmonary function tests have been ordered and Flovent metered-dose inhaler given to patient via samples. Instruction has  been given. Patient and husband strongly admonished to continue every attempt to stop smoking.  Unspecified gastritis and gastroduodenitis without mention of hemorrhage Patient advised to continue daily use of Prevacid. She is taking Plavix for history of multiple drug-eluting stents. She has not had a colonoscopy in over 10 years. Her last endoscopy was in 2008 during hospitalization for persistent nausea and vomiting at which time gastric erosions were noted.   A total of 40 minutes was spent with patient more than half of which was spent in counseling, reviewing records from other prviders and coordination of care.  Updated Medication List Outpatient Encounter Prescriptions as of 02/25/2013  Medication Sig Dispense Refill  . amitriptyline (ELAVIL) 150 MG tablet TAKE 1 HOUR BEFORE BEDTIME EVERY NIGHT FOR INSOMNIA  30 tablet  2  . aspirin 81 MG tablet Take 81 mg by mouth daily.        . Calcium & Magnesium Carbonates (MYLANTA PO) Take by mouth.        . carvedilol (COREG) 6.25 MG tablet Take 1 tablet (6.25 mg total) by mouth 2 (two) times daily.  180 tablet  3  . clobetasol ointment (TEMOVATE) 0.05 % APPLY EVERY NIGHT TO VAGINAL FOLDS  30 g  1  . clopidogrel (PLAVIX) 75 MG tablet TAKE 1 TABLET (75 MG TOTAL) BY MOUTH DAILY.  90 tablet  3  . cyanocobalamin (,VITAMIN B-12,) 1000 MCG/ML injection Inject 1 mL (1,000 mcg total) into the muscle once.  1 mL  1  . donepezil (ARICEPT) 5 MG tablet Take 1 tablet (5 mg total) by mouth at bedtime.  90 tablet  3  . DULoxetine (CYMBALTA) 60 MG capsule Take 1 capsule (60 mg total) by mouth daily.  30 capsule  2  . enalapril (VASOTEC) 10 MG tablet Take 1 tablet (10 mg total) by mouth daily.  90 tablet  1  . glipiZIDE (GLUCOTROL) 10 MG tablet Before breakfast and supper (evening meal)  180 tablet  3  . glucose blood (FREESTYLE LITE) test strip Check blood sugar 2 times daily.  200 each  3  . glucose monitoring kit (FREESTYLE) monitoring kit 1 each by Does not  apply route as needed for other. PHARMACY PLEASE SUBSTITUTE WHATEVER GLUCOMETER YOU CARRY  1 each  1  . HYDROcodone-acetaminophen (NORCO/VICODIN) 5-325 MG per tablet Take 1 tablet by mouth every 6 (six) hours as needed for pain.  90 tablet  1  . isosorbide mononitrate (IMDUR) 30 MG 24  hr tablet Take 1 tablet (30 mg total) by mouth daily.  90 tablet  3  . Levothyroxine Sodium 137 MCG CAPS Take 1 capsule (137 mcg total) by mouth daily before breakfast.  30 capsule  2  . magnesium oxide (MAG-OX) 400 MG tablet Take 400 mg by mouth daily.        . nitroGLYCERIN (NITROSTAT) 0.4 MG SL tablet Place 1 tablet (0.4 mg total) under the tongue every 5 (five) minutes as needed.  30 tablet  1  . ondansetron (ZOFRAN-ODT) 8 MG disintegrating tablet Take 1 tablet (8 mg total) by mouth every 8 (eight) hours as needed for nausea.  20 tablet  0  . sitaGLIPtin (JANUVIA) 100 MG tablet Take 1 tablet (100 mg total) by mouth daily.  90 tablet  3  . traZODone (DESYREL) 100 MG tablet TAKE 1 TABLET (100 MG TOTAL) BY MOUTH AT BEDTIME.  90 tablet  2  . zolpidem (AMBIEN) 5 MG tablet Take 2 tablets (10 mg total) by mouth at bedtime as needed for sleep.  180 tablet  3  . [DISCONTINUED] insulin detemir (LEVEMIR FLEXPEN) 100 UNIT/ML injection Inject 30 Units into the skin at bedtime.  45 mL  3  . [DISCONTINUED] pravastatin (PRAVACHOL) 80 MG tablet TAKE 1 TABLET BY MOUTH EVERY DAY  90 tablet  3  . atorvastatin (LIPITOR) 40 MG tablet Take 1 tablet (40 mg total) by mouth daily.  90 tablet  3  . fluticasone (FLOVENT HFA) 220 MCG/ACT inhaler Inhale 1 puff into the lungs 2 (two) times daily.  1 Inhaler  12  . insulin NPH (NOVOLIN N) 100 UNIT/ML injection Inject 30 Units into the skin 2 (two) times daily at 8 am and 10 pm.  1 vial  11  . lansoprazole (PREVACID 24HR) 15 MG capsule Take 1 capsule (15 mg total) by mouth daily.  90 capsule  3   No facility-administered encounter medications on file as of 02/25/2013.

## 2013-02-25 NOTE — Patient Instructions (Addendum)
1) You need to have your annual diabetic eye exam.  I am setting you up for this today at [facility]      I will adjust your insulin once review your labs    2)  I have started you on a breathing medication called flovent.  1 puff twice daily RINSE YOUR MOUTH AFTER EACH USE OR YOU WILL GET A TONGUE INFECTION CALLED THRUSH   I have ordered Pulmonary Function tests to be set up today as well. This will be followed by appt with Dr Kendrick Fries,  the pulmonologist      3) The pulses in your feet are weak.  I have made a referral for a test on your circulation in your legs with [facility]      4) YOU MUST QUIT SMOKING!!!! YOUR LUNGS AND YOUR HEART ARE BEING HURT EVERY TIME YOU SMOKE

## 2013-02-26 LAB — VITAMIN D 25 HYDROXY (VIT D DEFICIENCY, FRACTURES): Vit D, 25-Hydroxy: 41 ng/mL (ref 30–89)

## 2013-02-27 DIAGNOSIS — Z9119 Patient's noncompliance with other medical treatment and regimen: Secondary | ICD-10-CM | POA: Insufficient documentation

## 2013-02-27 DIAGNOSIS — K2970 Gastritis, unspecified, without bleeding: Secondary | ICD-10-CM | POA: Insufficient documentation

## 2013-02-27 MED ORDER — LANSOPRAZOLE 15 MG PO CPDR: 15.0000 mg | DELAYED_RELEASE_CAPSULE | Freq: Every day | ORAL | Status: DC

## 2013-02-27 MED ORDER — ATORVASTATIN CALCIUM 40 MG PO TABS: 40.0000 mg | ORAL_TABLET | Freq: Every day | ORAL | Status: DC

## 2013-02-27 MED ORDER — INSULIN NPH (HUMAN) (ISOPHANE) 100 UNIT/ML ~~LOC~~ SUSP: 30.0000 [IU] | Freq: Two times a day (BID) | SUBCUTANEOUS | Status: DC

## 2013-02-27 NOTE — Assessment & Plan Note (Addendum)
Pulmonary function tests have been ordered and Flovent metered-dose inhaler given to patient via samples. Instruction has been given. Patient and husband strongly admonished to continue every attempt to stop smoking.

## 2013-02-27 NOTE — Assessment & Plan Note (Signed)
Patient advised to continue daily use of Prevacid. She is taking Plavix for history of multiple drug-eluting stents. She has not had a colonoscopy in over 10 years. Her last endoscopy was in 2008 during hospitalization for persistent nausea and vomiting at which time gastric erosions were noted.

## 2013-02-27 NOTE — Assessment & Plan Note (Addendum)
Uncontrolled despite addition of Levemir at last visit. A1c is back up to 12.6  She was referred to diabetes education at last visit but did not go.  Again her lack of family support and concurrent dementia are significant barriers to control. I made a referral to [facility] after her last visit for application of available community support measures but they have been ineffective. I will change her insulin today to NPH using  30 units twice daily to start with and will see her back every two weeks  until she can be seen by our endocrinology.. Continue oral medications.

## 2013-02-27 NOTE — Assessment & Plan Note (Signed)
Patient has been referred to GYN since her s79mptoms have not resolved with clobetasol.

## 2013-02-27 NOTE — Assessment & Plan Note (Addendum)
Difficult to maintain therapeutic thyroid function due to  noncompliance with medication secondary to dementia.. Her dose was changed recently and repeat TSH is just within normal limits. No changes today.

## 2013-02-27 NOTE — Assessment & Plan Note (Addendum)
Her statin needs to be changed to atorvastatin .  I suspect her triglycerides are elevated because she has not fasted. We will repeat in 6 weeks. If triglycerides are still elevated at 6 weeks we will consider changing her to Crestor and fenofibrate/TriCor

## 2013-02-27 NOTE — Assessment & Plan Note (Signed)
Smoking cessation instruction/counseling given:  counseled patient on the dangers of tobacco use, advised patient to stop smoking, and reviewed strategies to maximize success. Her barriers to successful quitting including her dementia, and lack of family support.

## 2013-02-27 NOTE — Assessment & Plan Note (Signed)
CPAP with supplemental oxygen has been ordered.

## 2013-02-27 NOTE — Assessment & Plan Note (Signed)
Patient is being referred to Dr. Lafe Garin for diabetes management.

## 2013-02-27 NOTE — Addendum Note (Signed)
Addended by: Sherlene Shams on: 02/27/2013 09:52 AM   Modules accepted: Orders

## 2013-02-28 ENCOUNTER — Other Ambulatory Visit: Payer: Self-pay | Admitting: Internal Medicine

## 2013-03-02 ENCOUNTER — Ambulatory Visit: Payer: Medicare Other | Admitting: Cardiovascular Disease

## 2013-03-17 ENCOUNTER — Telehealth: Payer: Self-pay | Admitting: Internal Medicine

## 2013-03-17 ENCOUNTER — Other Ambulatory Visit: Payer: Self-pay | Admitting: Internal Medicine

## 2013-03-17 ENCOUNTER — Ambulatory Visit: Payer: Self-pay | Admitting: Internal Medicine

## 2013-03-17 DIAGNOSIS — J449 Chronic obstructive pulmonary disease, unspecified: Secondary | ICD-10-CM | POA: Diagnosis not present

## 2013-03-17 DIAGNOSIS — R0602 Shortness of breath: Secondary | ICD-10-CM | POA: Diagnosis not present

## 2013-03-17 NOTE — Telephone Encounter (Signed)
Patient is requesting needles called in to [facility] [address] For her insulin. She did request a few from our office and Kriste Basque has already brought them up front.

## 2013-03-18 ENCOUNTER — Telehealth: Payer: Self-pay | Admitting: Internal Medicine

## 2013-03-18 DIAGNOSIS — I700 Atherosclerosis of aorta: Secondary | ICD-10-CM | POA: Diagnosis not present

## 2013-03-18 DIAGNOSIS — E119 Type 2 diabetes mellitus without complications: Secondary | ICD-10-CM | POA: Diagnosis not present

## 2013-03-18 DIAGNOSIS — I2510 Atherosclerotic heart disease of native coronary artery without angina pectoris: Secondary | ICD-10-CM | POA: Diagnosis not present

## 2013-03-18 DIAGNOSIS — M79609 Pain in unspecified limb: Secondary | ICD-10-CM | POA: Diagnosis not present

## 2013-03-18 LAB — PULMONARY FUNCTION TEST

## 2013-03-18 MED ORDER — "INSULIN SYRINGE 28G X 1/2"" 1 ML MISC": Status: DC

## 2013-03-18 NOTE — Telephone Encounter (Signed)
Refill completed, see other telephone note. 

## 2013-03-18 NOTE — Telephone Encounter (Signed)
Pt is needing needles for her insulin shot. [facility] [address] Pt is completely out.

## 2013-03-20 ENCOUNTER — Other Ambulatory Visit: Payer: Self-pay | Admitting: Internal Medicine

## 2013-03-23 ENCOUNTER — Telehealth: Payer: Self-pay | Admitting: Internal Medicine

## 2013-03-23 DIAGNOSIS — J449 Chronic obstructive pulmonary disease, unspecified: Secondary | ICD-10-CM

## 2013-03-23 NOTE — Telephone Encounter (Signed)
Got it;  Thanks for passing PFT data along

## 2013-03-23 NOTE — Telephone Encounter (Signed)
Patient has appointment with you in the near future, the results of the pulmonary function tests that were done at [facility]  Pulmonary function tests done at [facility] [place] 03/18/2013 Forced vital capacity 2.6 L he 5% of predicted Forced expiratory volume 1.2 L 67% of predicted Forced expiratory volume/forced vital capacity ratio 75% of predicted. Diffusion 72% of predicted Vital capacity 2.8 L 88% predicted Residual volume 1.45 L 68% of predicted Total lung capacity 4.25 L 78% of predicted. Volume/Flow Loops : normal. These were read by Dr. Belia Heman  I don't believe them for 1 minute, given her extensive tobacco history unless all of her respiratory issues are due to cardiomyopathy

## 2013-03-24 ENCOUNTER — Encounter: Payer: Self-pay | Admitting: Internal Medicine

## 2013-03-24 ENCOUNTER — Ambulatory Visit (INDEPENDENT_AMBULATORY_CARE_PROVIDER_SITE_OTHER): Payer: Medicare Other | Admitting: Cardiovascular Disease

## 2013-03-24 ENCOUNTER — Ambulatory Visit (INDEPENDENT_AMBULATORY_CARE_PROVIDER_SITE_OTHER): Payer: Medicare Other | Admitting: Adult Health

## 2013-03-24 ENCOUNTER — Encounter: Payer: Self-pay | Admitting: *Deleted

## 2013-03-24 ENCOUNTER — Encounter: Payer: Self-pay | Admitting: Adult Health

## 2013-03-24 ENCOUNTER — Encounter: Payer: Self-pay | Admitting: Cardiovascular Disease

## 2013-03-24 VITALS — BP 102/72 | HR 70 | Ht 67.0 in | Wt 155.5 lb

## 2013-03-24 VITALS — BP 118/70 | HR 92 | Temp 98.2°F | Resp 14 | Ht 67.0 in | Wt 155.0 lb

## 2013-03-24 DIAGNOSIS — F17200 Nicotine dependence, unspecified, uncomplicated: Secondary | ICD-10-CM | POA: Diagnosis not present

## 2013-03-24 DIAGNOSIS — Z720 Tobacco use: Secondary | ICD-10-CM

## 2013-03-24 DIAGNOSIS — I2510 Atherosclerotic heart disease of native coronary artery without angina pectoris: Secondary | ICD-10-CM | POA: Diagnosis not present

## 2013-03-24 DIAGNOSIS — R5381 Other malaise: Secondary | ICD-10-CM | POA: Diagnosis not present

## 2013-03-24 DIAGNOSIS — R52 Pain, unspecified: Secondary | ICD-10-CM | POA: Diagnosis not present

## 2013-03-24 DIAGNOSIS — IMO0001 Reserved for inherently not codable concepts without codable children: Secondary | ICD-10-CM

## 2013-03-24 DIAGNOSIS — E1151 Type 2 diabetes mellitus with diabetic peripheral angiopathy without gangrene: Secondary | ICD-10-CM

## 2013-03-24 DIAGNOSIS — E785 Hyperlipidemia, unspecified: Secondary | ICD-10-CM

## 2013-03-24 DIAGNOSIS — R61 Generalized hyperhidrosis: Secondary | ICD-10-CM | POA: Diagnosis not present

## 2013-03-24 LAB — POCT INFLUENZA A/B
Influenza A, POC: NEGATIVE
Influenza B, POC: NEGATIVE

## 2013-03-24 MED ORDER — "INSULIN SYRINGE 28G X 1/2"" 1 ML MISC": Status: DC

## 2013-03-24 MED ORDER — AMOXICILLIN-POT CLAVULANATE 875-125 MG PO TABS: 1.0000 | ORAL_TABLET | Freq: Two times a day (BID) | ORAL | Status: DC

## 2013-03-24 NOTE — Progress Notes (Signed)
Patient ID: Sarah Payne, female    DOB: 1945-11-06, 67 y.o.   MRN: [id number]  HPI Comments: 67 year old woman with a long history of smoking (now smoking electronic cigarette), coronary artery disease, prior stent x3 placed to the mid left circumflex with moderate to severe LAD disease , stent placed to her distal RCA in February 2013, stent to the mid left circumflex in March 2013 , poorly controlled diabetes, depression, hypertension, cardiac catheterization 08/19/2012 showing moderate mid LAD disease at the takeoff of the diagonal vessel, severe ostial to mid RCA disease, ejection fraction greater than 55%. She had FFR pressure wire showing severe disease of the RCA with drug-eluting stent x2 placed.  She reports that she has a virus. Everybody in her family also has upper respiratory infection. Wearing a mask in the exam room today. Significant malaise, aching, sweating, cough. No production/no sputum. She has had this for several days.  She does report having sweating even before she was ill with his virus. She would sweat in the daytime and nighttime. Unrelated to her pain medication. She reports having menopausal symptoms in her late 65s. No significant chest pain or shortness of breath with exertion. No anginal symptoms.  Echocardiogram February 07, 2011 shows normal LV systolic function, diastolic dysfunction, moderate inferior wall hypokinesis, normal right ventricular systolic pressures.  EKG shows normal sinus rhythm with rate 70 beats per minute with T-wave abnormality in leads  one and aVL   Outpatient Encounter Prescriptions as of 03/24/2013  Medication Sig Dispense Refill  . amitriptyline (ELAVIL) 150 MG tablet TAKE 1 HOUR BEFORE BEDTIME EVERY NIGHT FOR INSOMNIA  30 tablet  2  . aspirin 81 MG tablet Take 81 mg by mouth daily.        Marland Kitchen atorvastatin (LIPITOR) 40 MG tablet Take 1 tablet (40 mg total) by mouth daily.  90 tablet  3  . Calcium & Magnesium Carbonates (MYLANTA PO) Take by  mouth.        . carvedilol (COREG) 6.25 MG tablet Take 1 tablet (6.25 mg total) by mouth 2 (two) times daily.  180 tablet  3  . clobetasol ointment (TEMOVATE) 0.05 % APPLY EVERY NIGHT TO VAGINAL FOLDS  30 g  0  . clopidogrel (PLAVIX) 75 MG tablet TAKE 1 TABLET (75 MG TOTAL) BY MOUTH DAILY.  90 tablet  3  . cyanocobalamin (,VITAMIN B-12,) 1000 MCG/ML injection INJECT 1 ML (1,000 MCG TOTAL) INTO THE MUSCLE ONCE.  1 mL  3  . donepezil (ARICEPT) 5 MG tablet Take 1 tablet (5 mg total) by mouth at bedtime.  90 tablet  3  . DULoxetine (CYMBALTA) 60 MG capsule TAKE 1 CAPSULE BY MOUTH DAILY.  30 capsule  2  . enalapril (VASOTEC) 10 MG tablet Take 1 tablet (10 mg total) by mouth daily.  90 tablet  1  . fluticasone (FLOVENT HFA) 220 MCG/ACT inhaler Inhale 1 puff into the lungs 2 (two) times daily.  1 Inhaler  12  . glipiZIDE (GLUCOTROL) 10 MG tablet Before breakfast and supper (evening meal)  180 tablet  3  . glucose blood (FREESTYLE LITE) test strip Check blood sugar 2 times daily.  200 each  3  . glucose monitoring kit (FREESTYLE) monitoring kit 1 each by Does not apply route as needed for other. PHARMACY PLEASE SUBSTITUTE WHATEVER GLUCOMETER YOU CARRY  1 each  1  . HYDROcodone-acetaminophen (NORCO/VICODIN) 5-325 MG per tablet Take 1 tablet by mouth every 6 (six) hours as needed for pain.  90 tablet  1  . insulin NPH (HUMULIN N,NOVOLIN N) 100 UNIT/ML injection Inject 30 Units into the skin 2 (two) times daily at 8 am and 10 pm.      . Insulin Syringe-Needle U-100 (INSULIN SYRINGE 1CC/28G) 28G X 1/2" 1 ML MISC For insulin administration  100 each  5  . isosorbide mononitrate (IMDUR) 30 MG 24 hr tablet Take 1 tablet (30 mg total) by mouth daily.  90 tablet  3  . lansoprazole (PREVACID 24HR) 15 MG capsule Take 1 capsule (15 mg total) by mouth daily.  90 capsule  3  . Levothyroxine Sodium 137 MCG CAPS Take 1 capsule (137 mcg total) by mouth daily before breakfast.  30 capsule  2  . magnesium oxide (MAG-OX) 400  MG tablet Take 400 mg by mouth daily.        . nitroGLYCERIN (NITROSTAT) 0.4 MG SL tablet Place 1 tablet (0.4 mg total) under the tongue every 5 (five) minutes as needed.  30 tablet  1  . ondansetron (ZOFRAN-ODT) 8 MG disintegrating tablet Take 1 tablet (8 mg total) by mouth every 8 (eight) hours as needed for nausea.  20 tablet  0  . sitaGLIPtin (JANUVIA) 100 MG tablet Take 1 tablet (100 mg total) by mouth daily.  90 tablet  3  . traZODone (DESYREL) 100 MG tablet TAKE 1 TABLET (100 MG TOTAL) BY MOUTH AT BEDTIME.  90 tablet  2  . zolpidem (AMBIEN) 5 MG tablet Take 2 tablets (10 mg total) by mouth at bedtime as needed for sleep.  180 tablet  3    Review of Systems  Constitutional: Positive for fatigue.       Malaise, sweating  HENT: Negative.   Eyes: Negative.   Respiratory: Negative.   Gastrointestinal: Negative.   Musculoskeletal: Negative.   Skin: Negative.   Neurological: Negative.   Psychiatric/Behavioral: Negative.   All other systems reviewed and are negative.   BP 102/72  Pulse 70  Ht 5\' 7"  (1.702 m)  Wt 155 lb 8 oz (70.534 kg)  BMI 24.35 kg/m2  Physical Exam  Nursing note and vitals reviewed. Constitutional: She is oriented to person, place, and time. She appears well-developed and well-nourished.  HENT:  Head: Normocephalic.  Nose: Nose normal.  Mouth/Throat: Oropharynx is clear and moist.  Eyes: Conjunctivae are normal. Pupils are equal, round, and reactive to light.  Neck: Normal range of motion. Neck supple. No JVD present.  Cardiovascular: Normal rate, regular rhythm, S1 normal, S2 normal, normal heart sounds and intact distal pulses.  Exam reveals no gallop and no friction rub.   No murmur heard. Pulmonary/Chest: Effort normal and breath sounds normal. No respiratory distress. She has no wheezes. She has no rales. She exhibits no tenderness.  Abdominal: Soft. Bowel sounds are normal. She exhibits no distension. There is no tenderness.  Musculoskeletal: Normal  range of motion. She exhibits no edema and no tenderness.  Lymphadenopathy:    She has no cervical adenopathy.  Neurological: She is alert and oriented to person, place, and time. Coordination normal.  Skin: Skin is warm and dry. No rash noted. No erythema.  Psychiatric: She has a normal mood and affect. Her behavior is normal. Judgment and thought content normal.    Assessment and Plan

## 2013-03-24 NOTE — Patient Instructions (Addendum)
You are doing well. No medication changes were made.  Monitor your blood pressure. If you have dizzy spells from low blood pressure, Hold the enalapril and call the office  Please call us if you have new issues that need to be addressed before your next appt.  Your physician wants you to follow-up in: 6 months.  You will receive a reminder letter in the mail two months in advance. If you don't receive a letter, please call our office to schedule the follow-up appointment.

## 2013-03-24 NOTE — Assessment & Plan Note (Signed)
Recommended she stay on her Lipitor. If repeat lipids continue to be above goal despite medication compliance, may need to add zetia or increase the Lipitor dose to 80 mg daily

## 2013-03-24 NOTE — Assessment & Plan Note (Addendum)
Body aches, fever, cough. Swabbed for flu and negative. Start Augmentin bid x 10 days. RTC if no improvement in 3-4 days.

## 2013-03-24 NOTE — Assessment & Plan Note (Signed)
Viral URI on today's visit. She has followup with Dr. Darrick Huntsman today and early next week.

## 2013-03-24 NOTE — Progress Notes (Signed)
Subjective:    Patient ID: Sarah Payne, female    DOB: [birth year], 67 y.o.   MRN: [id number]  HPI  Patient presents with c/o sinus pressure, chest congestion, drainage, fever of 101. Symptoms began on Sunday. She has not taken any medication other than what she is prescribed. She reports having body aches everywhere. Recent sick contacts, all with the same symptoms. No shortness of breath or chest pain.   Current Outpatient Prescriptions on File Prior to Visit  Medication Sig Dispense Refill  . amitriptyline (ELAVIL) 150 MG tablet TAKE 1 HOUR BEFORE BEDTIME EVERY NIGHT FOR INSOMNIA  30 tablet  2  . aspirin 81 MG tablet Take 81 mg by mouth daily.        Marland Kitchen atorvastatin (LIPITOR) 40 MG tablet Take 1 tablet (40 mg total) by mouth daily.  90 tablet  3  . Calcium & Magnesium Carbonates (MYLANTA PO) Take by mouth.        . carvedilol (COREG) 6.25 MG tablet Take 1 tablet (6.25 mg total) by mouth 2 (two) times daily.  180 tablet  3  . clobetasol ointment (TEMOVATE) 0.05 % APPLY EVERY NIGHT TO VAGINAL FOLDS  30 g  0  . clopidogrel (PLAVIX) 75 MG tablet TAKE 1 TABLET (75 MG TOTAL) BY MOUTH DAILY.  90 tablet  3  . cyanocobalamin (,VITAMIN B-12,) 1000 MCG/ML injection INJECT 1 ML (1,000 MCG TOTAL) INTO THE MUSCLE ONCE.  1 mL  3  . donepezil (ARICEPT) 5 MG tablet Take 1 tablet (5 mg total) by mouth at bedtime.  90 tablet  3  . DULoxetine (CYMBALTA) 60 MG capsule TAKE 1 CAPSULE BY MOUTH DAILY.  30 capsule  2  . enalapril (VASOTEC) 10 MG tablet Take 1 tablet (10 mg total) by mouth daily.  90 tablet  1  . fluticasone (FLOVENT HFA) 220 MCG/ACT inhaler Inhale 1 puff into the lungs 2 (two) times daily.  1 Inhaler  12  . glipiZIDE (GLUCOTROL) 10 MG tablet Before breakfast and supper (evening meal)  180 tablet  3  . glucose blood (FREESTYLE LITE) test strip Check blood sugar 2 times daily.  200 each  3  . glucose monitoring kit (FREESTYLE) monitoring kit 1 each by Does not apply route as needed for other.  PHARMACY PLEASE SUBSTITUTE WHATEVER GLUCOMETER YOU CARRY  1 each  1  . HYDROcodone-acetaminophen (NORCO/VICODIN) 5-325 MG per tablet Take 1 tablet by mouth every 6 (six) hours as needed for pain.  90 tablet  1  . insulin NPH (HUMULIN N,NOVOLIN N) 100 UNIT/ML injection Inject 30 Units into the skin 2 (two) times daily at 8 am and 10 pm.      . Insulin Syringe-Needle U-100 (INSULIN SYRINGE 1CC/28G) 28G X 1/2" 1 ML MISC For insulin administration  100 each  5  . isosorbide mononitrate (IMDUR) 30 MG 24 hr tablet Take 1 tablet (30 mg total) by mouth daily.  90 tablet  3  . lansoprazole (PREVACID 24HR) 15 MG capsule Take 1 capsule (15 mg total) by mouth daily.  90 capsule  3  . Levothyroxine Sodium 137 MCG CAPS Take 1 capsule (137 mcg total) by mouth daily before breakfast.  30 capsule  2  . magnesium oxide (MAG-OX) 400 MG tablet Take 400 mg by mouth daily.        . nitroGLYCERIN (NITROSTAT) 0.4 MG SL tablet Place 1 tablet (0.4 mg total) under the tongue every 5 (five) minutes as needed.  30 tablet  1  .  ondansetron (ZOFRAN-ODT) 8 MG disintegrating tablet Take 1 tablet (8 mg total) by mouth every 8 (eight) hours as needed for nausea.  20 tablet  0  . sitaGLIPtin (JANUVIA) 100 MG tablet Take 1 tablet (100 mg total) by mouth daily.  90 tablet  3  . traZODone (DESYREL) 100 MG tablet TAKE 1 TABLET (100 MG TOTAL) BY MOUTH AT BEDTIME.  90 tablet  2  . zolpidem (AMBIEN) 5 MG tablet Take 2 tablets (10 mg total) by mouth at bedtime as needed for sleep.  180 tablet  3   No current facility-administered medications on file prior to visit.     Review of Systems  Constitutional: Positive for fever and chills.  HENT: Positive for congestion and sinus pressure. Negative for sore throat.   Respiratory: Positive for cough. Negative for shortness of breath and wheezing.   Gastrointestinal: Negative for nausea, vomiting and diarrhea.  Neurological: Positive for weakness.    BP 118/70  Pulse 92  Temp(Src) 98.2 F  (36.8 C) (Oral)  Resp 14  Ht 5\' 7"  (1.702 m)  Wt 155 lb (70.308 kg)  BMI 24.27 kg/m2  SpO2 96%     Objective:   Physical Exam  Constitutional: She is oriented to person, place, and time.  67 y/o female who appears acutely ill.  HENT:  Head: Normocephalic and atraumatic.  Right Ear: External ear normal.  Left Ear: External ear normal.  Mouth/Throat: Oropharynx is clear and moist.  Cardiovascular: Normal rate, regular rhythm, normal heart sounds and intact distal pulses.  Exam reveals no gallop and no friction rub.   No murmur heard. Pulmonary/Chest: Effort normal and breath sounds normal. No respiratory distress. She has no wheezes. She has no rales.  Abdominal: Soft. Bowel sounds are normal.  Lymphadenopathy:    She has cervical adenopathy.  Neurological: She is alert and oriented to person, place, and time.  Skin: Skin is warm.  Psychiatric: She has a normal mood and affect. Her behavior is normal. Judgment and thought content normal.       Assessment & Plan:

## 2013-03-24 NOTE — Patient Instructions (Addendum)
  Start Augmentin twice a day for 10 days.  Stay hydrated by drinking plenty of fluids.  Call if you are not any better by Monday.

## 2013-03-24 NOTE — Assessment & Plan Note (Signed)
Recommended smoking cessation. She reports that she is using electronic cigarette predominantly

## 2013-03-24 NOTE — Assessment & Plan Note (Signed)
Currently with no symptoms of angina. No further workup at this time. Continue current medication regimen. 

## 2013-03-24 NOTE — Assessment & Plan Note (Signed)
Very sweaty on today's visit. Uncertain what part of this is from her viral URI. She does report having this even before she was sick. Etiology is not clear. May need reevaluation once or viral URI has improved.

## 2013-03-25 ENCOUNTER — Encounter: Payer: Self-pay | Admitting: *Deleted

## 2013-03-28 ENCOUNTER — Encounter: Payer: Self-pay | Admitting: Internal Medicine

## 2013-03-28 ENCOUNTER — Ambulatory Visit (INDEPENDENT_AMBULATORY_CARE_PROVIDER_SITE_OTHER): Payer: Medicare Other | Admitting: Internal Medicine

## 2013-03-28 VITALS — BP 108/62 | HR 80 | Temp 98.4°F | Resp 16 | Ht 67.0 in | Wt 158.0 lb

## 2013-03-28 DIAGNOSIS — Z23 Encounter for immunization: Secondary | ICD-10-CM | POA: Diagnosis not present

## 2013-03-28 DIAGNOSIS — Z91199 Patient's noncompliance with other medical treatment and regimen due to unspecified reason: Secondary | ICD-10-CM

## 2013-03-28 DIAGNOSIS — I739 Peripheral vascular disease, unspecified: Secondary | ICD-10-CM | POA: Diagnosis not present

## 2013-03-28 DIAGNOSIS — Z9119 Patient's noncompliance with other medical treatment and regimen: Secondary | ICD-10-CM

## 2013-03-28 DIAGNOSIS — F17200 Nicotine dependence, unspecified, uncomplicated: Secondary | ICD-10-CM | POA: Diagnosis not present

## 2013-03-28 DIAGNOSIS — E1159 Type 2 diabetes mellitus with other circulatory complications: Secondary | ICD-10-CM | POA: Diagnosis not present

## 2013-03-28 DIAGNOSIS — E1151 Type 2 diabetes mellitus with diabetic peripheral angiopathy without gangrene: Secondary | ICD-10-CM

## 2013-03-28 DIAGNOSIS — Z720 Tobacco use: Secondary | ICD-10-CM

## 2013-03-28 MED ORDER — GLUCOSE BLOOD VI STRP: ORAL_STRIP | Status: DC

## 2013-03-28 MED ORDER — ZOLPIDEM TARTRATE 5 MG PO TABS: 5.0000 mg | ORAL_TABLET | Freq: Every evening | ORAL | Status: DC | PRN

## 2013-03-28 MED ORDER — ALBUTEROL SULFATE HFA 108 (90 BASE) MCG/ACT IN AERS: 2.0000 | INHALATION_SPRAY | Freq: Four times a day (QID) | RESPIRATORY_TRACT | Status: DC | PRN

## 2013-03-28 MED ORDER — HYDROCODONE-ACETAMINOPHEN 5-325 MG PO TABS: 1.0000 | ORAL_TABLET | Freq: Four times a day (QID) | ORAL | Status: DC | PRN

## 2013-03-28 NOTE — Assessment & Plan Note (Signed)
Secondary to lack of family support and vascular dementia. Lonf discussion with husband gtoday about her prognosis without control of diabetes.  Referral to Endocrine in late October .

## 2013-03-28 NOTE — Assessment & Plan Note (Addendum)
Increase evening NPH dose to 35 units.  Return in 2 weeks with log of blood sugars .Marland Kitchen  Foot exam normal

## 2013-03-28 NOTE — Patient Instructions (Addendum)
Increase the evening insulin dose to 35 units .  This should helpo lower your fasting sugars to 120 (goal is 80 to 120 for fasting)  Please check sugars in the afternoon 2 hrs after lunch along with fasting   Return in 2 weeks for review of sugars    You received your Pneumonia vaccine and your TDaP (tetanus diptheria  Acellular Pertussis) vaccines today  You missed your appt with Dr Druscilla Brownie on August 21.  It has been rescheduled for  September 10 at 2:15

## 2013-03-28 NOTE — Progress Notes (Signed)
Patient ID: Sarah Payne, female   DOB: 23-Dec-1945, 67 y.o.   MRN: [id number]   Patient Active Problem List   Diagnosis Date Noted  . Sweating 03/24/2013  . Malaise 03/24/2013  . Noncompliance with diabetes treatment 02/27/2013  . Unspecified gastritis and gastroduodenitis without mention of hemorrhage 02/27/2013  . Obstructive sleep apnea 02/25/2013  . Leg weakness, bilateral 10/07/2012  . Other malaise and fatigue 10/06/2012  . Chronic systolic congestive heart failure 05/21/2012  . Lichen sclerosus et atrophicus 05/20/2012  . Bronchitis, chronic obstructive 05/20/2012  . Headache 10/15/2011  . Angina at rest 09/25/2011  . Diabetes mellitus type 2 with peripheral artery disease 08/28/2011  . History of lumbar discectomy 06/29/2011  . Depression 06/24/2011  . Hypothyroidism 05/03/2011  . CAD, multiple vessel 02/24/2011  . Tobacco abuse 02/24/2011  . Hyperlipidemia 02/24/2011    Subjective:  CC:   Chief Complaint  Patient presents with  . Follow-up    blood sugars    HPI:   Sarah Payne a 67 y.o. female who presents Follow up on COPD exacerbation , uncontrolled diabetes mellitus complicated by PAD, CAD and vascular dementia.    hgba1c was recently > 11.  She did not check her sugars for several months due to reports of malfunctioning glucometer. Her husband calims that he spoke with someone at the office requesting assistance in getting a new one but there are no docuemented calls.  Shw missed her appt with [facility] for annual dilated eye exam last week due to illness.  The only CBGs she can relay are the last 2 fastings of 166 , yesterday 161 .  She is taking NPH 30 units at 9 am and 8 PM.  She is requesting refills on her ambien and vicodin     Past Medical History  Diagnosis Date  . Diabetes mellitus   . Hypertension   . Hypothyroidism   . Depression   . Vascular dementia   . Tobacco abuse     a. 45 pack year history - quit 09/16/11  . Chronic low back pain    . Coronary artery disease     a. s/p PCI/DES LCX x 3;  b. NSTEMI 09/16/11;  c.   09/17/11 Cath [facility] Severe RCA/LCX dzs - RCA treated, LCX intervention pending w/ Dr. Juliann Pares    Past Surgical History  Procedure Laterality Date  . Back surgery    . Abdominal hysterectomy    . Appendectomy    . Cardiac catheterization  2013  . Coronary angioplasty  2012    stent x 3   . Coronary angioplasty with stent placement  2013       The following portions of the patient's history were reviewed and updated as appropriate: Allergies, current medications, and problem list.    Review of Systems:   12 Pt  review of systems was negative except those addressed in the HPI,     History   Social History  . Marital Status: Married    Spouse Name: N/A    Number of Children: N/A  . Years of Education: N/A   Occupational History  . Not on file.   Social History Main Topics  . Smoking status: Current Every Day Smoker -- 0.25 packs/day    Types: Cigarettes  . Smokeless tobacco: Never Used     Comment: Has cut back, trying to quit. Using the E-cigarette.  . Alcohol Use: No  . Drug Use: No  . Sexual Activity: Not on  file   Other Topics Concern  . Not on file   Social History Narrative   Lives at home with her husband in [place].  Previously used marijuana - quit.    Objective:  Filed Vitals:   03/28/13 1616  BP: 108/62  Pulse: 80  Temp: 98.4 F (36.9 C)  Resp: 16     General appearance: alert, cooperative and appears stated age Ears: normal TM's and external ear canals both ears Throat: lips, mucosa, and tongue normal; teeth and gums normal Neck: no adenopathy, no carotid bruit, supple, symmetrical, trachea midline and thyroid not enlarged, symmetric, no tenderness/mass/nodules Back: symmetric, no curvature. ROM normal. No CVA tenderness. Lungs: clear to auscultation bilaterally Heart: regular rate and rhythm, S1, S2 normal, no murmur, click, rub or gallop Abdomen:  soft, non-tender; bowel sounds normal; no masses,  no organomegaly Pulses: 2+ and symmetric Skin: Skin color, texture, turgor normal. No rashes or lesions Lymph nodes: Cervical, supraclavicular, and axillary nodes normal.  Assessment and Plan:  Tobacco abuse She has reduced her consumption but not stopped.  Using e cigarettes.  Noncompliance with diabetes treatment Secondary to lack of family support and vascular dementia. Lonf discussion with husband gtoday about her prognosis without control of diabetes.  Referral to Endocrine in late October .   Diabetes mellitus type 2 with peripheral artery disease Increase evening NPH dose to 35 units.  Return in 2 weeks with log of blood sugars .Marland Kitchen  Foot exam normal   A total of 40 minutes was spent with patient more than half of which was spent in counseling, reviewing records from other prviders and coordination of care.   Updated Medication List Outpatient Encounter Prescriptions as of 03/28/2013  Medication Sig Dispense Refill  . amoxicillin-clavulanate (AUGMENTIN) 875-125 MG per tablet Take 1 tablet by mouth 2 (two) times daily.  20 tablet  0  . aspirin 81 MG tablet Take 81 mg by mouth daily.        Marland Kitchen atorvastatin (LIPITOR) 40 MG tablet Take 1 tablet (40 mg total) by mouth daily.  90 tablet  3  . Calcium & Magnesium Carbonates (MYLANTA PO) Take by mouth.        . carvedilol (COREG) 6.25 MG tablet Take 1 tablet (6.25 mg total) by mouth 2 (two) times daily.  180 tablet  3  . clobetasol ointment (TEMOVATE) 0.05 % APPLY EVERY NIGHT TO VAGINAL FOLDS  30 g  0  . clopidogrel (PLAVIX) 75 MG tablet TAKE 1 TABLET (75 MG TOTAL) BY MOUTH DAILY.  90 tablet  3  . cyanocobalamin (,VITAMIN B-12,) 1000 MCG/ML injection INJECT 1 ML (1,000 MCG TOTAL) INTO THE MUSCLE ONCE.  1 mL  3  . donepezil (ARICEPT) 5 MG tablet Take 1 tablet (5 mg total) by mouth at bedtime.  90 tablet  3  . DULoxetine (CYMBALTA) 60 MG capsule TAKE 1 CAPSULE BY MOUTH DAILY.  30 capsule  2   . enalapril (VASOTEC) 10 MG tablet Take 1 tablet (10 mg total) by mouth daily.  90 tablet  1  . fluticasone (FLOVENT HFA) 220 MCG/ACT inhaler Inhale 1 puff into the lungs 2 (two) times daily.  1 Inhaler  12  . glipiZIDE (GLUCOTROL) 10 MG tablet Before breakfast and supper (evening meal)  180 tablet  3  . glucose blood (FREESTYLE LITE) test strip Check blood sugar 2 times daily to check blood sugars  .,  Uncontrolled diabetes mellitus  100 each  11  . glucose monitoring kit (FREESTYLE)  monitoring kit 1 each by Does not apply route as needed for other. PHARMACY PLEASE SUBSTITUTE WHATEVER GLUCOMETER YOU CARRY  1 each  1  . HYDROcodone-acetaminophen (NORCO/VICODIN) 5-325 MG per tablet Take 1 tablet by mouth every 6 (six) hours as needed for pain.  90 tablet  1  . insulin NPH (HUMULIN N,NOVOLIN N) 100 UNIT/ML injection Inject 30 Units into the skin 2 (two) times daily at 8 am and 10 pm.      . Insulin Syringe-Needle U-100 (INSULIN SYRINGE 1CC/28G) 28G X 1/2" 1 ML MISC For insulin administration  100 each  5  . isosorbide mononitrate (IMDUR) 30 MG 24 hr tablet Take 1 tablet (30 mg total) by mouth daily.  90 tablet  3  . lansoprazole (PREVACID 24HR) 15 MG capsule Take 1 capsule (15 mg total) by mouth daily.  90 capsule  3  . Levothyroxine Sodium 137 MCG CAPS Take 1 capsule (137 mcg total) by mouth daily before breakfast.  30 capsule  2  . magnesium oxide (MAG-OX) 400 MG tablet Take 400 mg by mouth daily.        . nitroGLYCERIN (NITROSTAT) 0.4 MG SL tablet Place 1 tablet (0.4 mg total) under the tongue every 5 (five) minutes as needed.  30 tablet  1  . sitaGLIPtin (JANUVIA) 100 MG tablet Take 1 tablet (100 mg total) by mouth daily.  90 tablet  3  . traZODone (DESYREL) 100 MG tablet TAKE 1 TABLET (100 MG TOTAL) BY MOUTH AT BEDTIME.  90 tablet  2  . zolpidem (AMBIEN) 5 MG tablet Take 1 tablet (5 mg total) by mouth at bedtime as needed for sleep.  90 tablet  3  . [DISCONTINUED] glucose blood (FREESTYLE LITE)  test strip Check blood sugar 2 times daily.  200 each  3  . [DISCONTINUED] HYDROcodone-acetaminophen (NORCO/VICODIN) 5-325 MG per tablet Take 1 tablet by mouth every 6 (six) hours as needed for pain.  90 tablet  1  . [DISCONTINUED] zolpidem (AMBIEN) 5 MG tablet Take 2 tablets (10 mg total) by mouth at bedtime as needed for sleep.  180 tablet  3  . albuterol (PROVENTIL HFA;VENTOLIN HFA) 108 (90 BASE) MCG/ACT inhaler Inhale 2 puffs into the lungs every 6 (six) hours as needed for wheezing.  1 Inhaler  11  . amitriptyline (ELAVIL) 150 MG tablet TAKE 1 HOUR BEFORE BEDTIME EVERY NIGHT FOR INSOMNIA  30 tablet  2  . ondansetron (ZOFRAN-ODT) 8 MG disintegrating tablet Take 1 tablet (8 mg total) by mouth every 8 (eight) hours as needed for nausea.  20 tablet  0   No facility-administered encounter medications on file as of 03/28/2013.     Orders Placed This Encounter  Procedures  . Tdap vaccine greater than or equal to 7yo IM  . Pneumococcal polysaccharide vaccine 23-valent greater than or equal to 2yo subcutaneous/IM  . HM DIABETES EYE EXAM    No Follow-up on file.

## 2013-03-28 NOTE — Assessment & Plan Note (Signed)
She has reduced her consumption but not stopped.  Using e cigarettes.

## 2013-03-29 ENCOUNTER — Ambulatory Visit (INDEPENDENT_AMBULATORY_CARE_PROVIDER_SITE_OTHER): Payer: Medicare Other | Admitting: Pulmonary Disease

## 2013-03-29 ENCOUNTER — Encounter: Payer: Self-pay | Admitting: Pulmonary Disease

## 2013-03-29 VITALS — BP 102/62 | HR 74 | Temp 98.2°F | Ht 67.0 in | Wt 157.0 lb

## 2013-03-29 DIAGNOSIS — J441 Chronic obstructive pulmonary disease with (acute) exacerbation: Secondary | ICD-10-CM | POA: Diagnosis not present

## 2013-03-29 DIAGNOSIS — J449 Chronic obstructive pulmonary disease, unspecified: Secondary | ICD-10-CM | POA: Insufficient documentation

## 2013-03-29 MED ORDER — ALBUTEROL SULFATE (2.5 MG/3ML) 0.083% IN NEBU: 2.5000 mg | INHALATION_SOLUTION | Freq: Four times a day (QID) | RESPIRATORY_TRACT | Status: DC | PRN

## 2013-03-29 MED ORDER — TIOTROPIUM BROMIDE MONOHYDRATE 18 MCG IN CAPS: 18.0000 ug | ORAL_CAPSULE | Freq: Every day | RESPIRATORY_TRACT | Status: DC

## 2013-03-29 NOTE — Patient Instructions (Signed)
Start taking the spiriva daily in addition to the Flovent Use the albuterol nebulizer every 4-6 hours at home for the next 2-3 days, then as needed  Keep the albuterol inhaler with you when you go out and use it as needed for shortness of breath (2 puffs every 2-4 hours)  Take mucinex for chest congestoin  Take phenylephrine tablets over the counter as directed for sinus congestion  Take saline rinses (Neil Med rinses with distilled water) at least twice a day for the sinus congestion  We will see you back in 3-4 weeks or sooner if needed

## 2013-03-29 NOTE — Assessment & Plan Note (Addendum)
[name]'s shortness of breath and wheezing in the setting of recent bronchitis are representative of a COPD exacerbation. She needs to have a chest x-ray to rule out another pulmonary infection such as pneumonia but I am not strongly suspicious of this.  I do not feel like starting prednisone is wise right now because of her poor glucose control. She should continue her current antibiotic.  I'm going to start Spiriva and ask her to use albuterol nebulized on a regular basis for the next few days to help her get through this. I've also asked her to start using Mucinex. She was instructed to call us if she does not have improvement in her symptoms by the end of the week and then she will likely need prednisone as well as another antibiotic.  She really needs to quit smoking.

## 2013-03-29 NOTE — Assessment & Plan Note (Signed)
COPD: GOLD Grade A or B, needs mMRC/CAT when not sick Combined recommendations from the [employer], [employer], Designer, television/film set, European Respiratory Society (Qaseem A et al, Ann Intern Med. 2011;155(3):179) recommends tobacco cessation, pulmonary rehab (for symptomatic patients with an FEV1 < 50% predicted), supplemental oxygen (for patients with SaO2 <88% or paO2 <55), and appropriate bronchodilator therapy.  In regards to long acting bronchodilators, they recommend monotherapy (FEV1 60-80% with symptoms weak evidence, FEV1 with symptoms <60% strong evidence), or combination therapy (FEV1 <60% with symptoms, strong recommendation, moderate evidence).  One should also provide patients with annual immunizations and consider therapy for prevention of COPD exacerbations (ie. roflumilast or azithromycin) when appopriate.  -O2 therapy: Not indicated -Immunizations: Next visit -Tobacco use: Advised at length to quit -Exercise: encourage regular exercise -Bronchodilator therapy: Start spiriva -Exacerbation prevention: spiriva

## 2013-03-29 NOTE — Progress Notes (Signed)
Subjective:    Patient ID: Sarah Payne, female    DOB: [birth year], 67 y.o.   MRN: [id number]  HPI  9/9/2014HPI >>   This is a 67 year old female with a past medical history significant for smoking who comes to our clinic today for evaluation of cough, wheezing, and shortness of breath. She's never been diagnosed with COPD but she has smoked at least one pack of cigarettes daily from the age of 49 until currently. She states in the last several weeks she's tried to cut back to at least one half pack of cigarettes daily. For the last 10 days she's had a cough, fatigue, malaise. It started with fevers and chills but those have now subsided. She is producing some sputum but no hemoptysis. She has not had chest pain. She's never been told past that she had any respiratory problems. She's never had a cold this bad in the past. She's never had a chest x-ray to evaluate this illness. She has had some wheezing in the last few days. She was given Flovent a couple weeks ago and she's been using it twice a day but states it hasn't helped a whole lot. She has not used the albuterol inhaler she was given yesterday. She also notes some sinus congestion and has been taking Augmentin for the last 4 days.   Past Medical History  Diagnosis Date  . Diabetes mellitus   . Hypertension   . Hypothyroidism   . Depression   . Vascular dementia   . Tobacco abuse     a. 45 pack year history - quit 09/16/11  . Chronic low back pain   . Coronary artery disease     a. s/p PCI/DES LCX x 3;  b. NSTEMI 09/16/11;  c.   09/17/11 Cath [facility]) Severe RCA/LCX dzs - RCA treated, LCX intervention pending w/ Dr. Juliann Pares     Family History  Problem Relation Age of Onset  . Heart attack Mother     First MI @ 46 - Died @ 103  . Heart disease Mother   . Heart disease Father     Died @ 63  . Throat cancer Brother      History   Social History  . Marital Status: Married    Spouse Name: N/A    Number of Children: N/A  .  Years of Education: N/A   Occupational History  . Not on file.   Social History Main Topics  . Smoking status: Current Every Day Smoker -- 0.25 packs/day    Types: Cigarettes  . Smokeless tobacco: Never Used     Comment: Has cut back, trying to quit. Using the E-cigarette.  . Alcohol Use: No  . Drug Use: No  . Sexual Activity: Not on file   Other Topics Concern  . Not on file   Social History Narrative   Lives at home with her husband in [place].  Previously used marijuana - quit.     No Known Allergies   Outpatient Prescriptions Prior to Visit  Medication Sig Dispense Refill  . albuterol (PROVENTIL HFA;VENTOLIN HFA) 108 (90 BASE) MCG/ACT inhaler Inhale 2 puffs into the lungs every 6 (six) hours as needed for wheezing.  1 Inhaler  11  . amitriptyline (ELAVIL) 150 MG tablet TAKE 1 HOUR BEFORE BEDTIME EVERY NIGHT FOR INSOMNIA  30 tablet  2  . amoxicillin-clavulanate (AUGMENTIN) 875-125 MG per tablet Take 1 tablet by mouth 2 (two) times daily.  20 tablet  0  .  aspirin 81 MG tablet Take 81 mg by mouth daily.        Marland Kitchen atorvastatin (LIPITOR) 40 MG tablet Take 1 tablet (40 mg total) by mouth daily.  90 tablet  3  . Calcium & Magnesium Carbonates (MYLANTA PO) Take by mouth.        . carvedilol (COREG) 6.25 MG tablet Take 1 tablet (6.25 mg total) by mouth 2 (two) times daily.  180 tablet  3  . clobetasol ointment (TEMOVATE) 0.05 % APPLY EVERY NIGHT TO VAGINAL FOLDS  30 g  0  . clopidogrel (PLAVIX) 75 MG tablet TAKE 1 TABLET (75 MG TOTAL) BY MOUTH DAILY.  90 tablet  3  . cyanocobalamin (,VITAMIN B-12,) 1000 MCG/ML injection INJECT 1 ML (1,000 MCG TOTAL) INTO THE MUSCLE ONCE.  1 mL  3  . donepezil (ARICEPT) 5 MG tablet Take 1 tablet (5 mg total) by mouth at bedtime.  90 tablet  3  . DULoxetine (CYMBALTA) 60 MG capsule TAKE 1 CAPSULE BY MOUTH DAILY.  30 capsule  2  . enalapril (VASOTEC) 10 MG tablet Take 1 tablet (10 mg total) by mouth daily.  90 tablet  1  . fluticasone (FLOVENT HFA)  220 MCG/ACT inhaler Inhale 1 puff into the lungs 2 (two) times daily.  1 Inhaler  12  . glipiZIDE (GLUCOTROL) 10 MG tablet Before breakfast and supper (evening meal)  180 tablet  3  . glucose blood (FREESTYLE LITE) test strip Check blood sugar 2 times daily to check blood sugars  .,  Uncontrolled diabetes mellitus  100 each  11  . glucose monitoring kit (FREESTYLE) monitoring kit 1 each by Does not apply route as needed for other. PHARMACY PLEASE SUBSTITUTE WHATEVER GLUCOMETER YOU CARRY  1 each  1  . HYDROcodone-acetaminophen (NORCO/VICODIN) 5-325 MG per tablet Take 1 tablet by mouth every 6 (six) hours as needed for pain.  90 tablet  1  . insulin NPH (HUMULIN N,NOVOLIN N) 100 UNIT/ML injection Inject 30 Units into the skin 2 (two) times daily at 8 am and 10 pm.      . Insulin Syringe-Needle U-100 (INSULIN SYRINGE 1CC/28G) 28G X 1/2" 1 ML MISC For insulin administration  100 each  5  . isosorbide mononitrate (IMDUR) 30 MG 24 hr tablet Take 1 tablet (30 mg total) by mouth daily.  90 tablet  3  . lansoprazole (PREVACID 24HR) 15 MG capsule Take 1 capsule (15 mg total) by mouth daily.  90 capsule  3  . Levothyroxine Sodium 137 MCG CAPS Take 1 capsule (137 mcg total) by mouth daily before breakfast.  30 capsule  2  . magnesium oxide (MAG-OX) 400 MG tablet Take 400 mg by mouth daily.        . nitroGLYCERIN (NITROSTAT) 0.4 MG SL tablet Place 1 tablet (0.4 mg total) under the tongue every 5 (five) minutes as needed.  30 tablet  1  . ondansetron (ZOFRAN-ODT) 8 MG disintegrating tablet Take 1 tablet (8 mg total) by mouth every 8 (eight) hours as needed for nausea.  20 tablet  0  . sitaGLIPtin (JANUVIA) 100 MG tablet Take 1 tablet (100 mg total) by mouth daily.  90 tablet  3  . traZODone (DESYREL) 100 MG tablet TAKE 1 TABLET (100 MG TOTAL) BY MOUTH AT BEDTIME.  90 tablet  2  . zolpidem (AMBIEN) 5 MG tablet Take 1 tablet (5 mg total) by mouth at bedtime as needed for sleep.  90 tablet  3   No  facility-administered medications prior to visit.      Review of Systems  Constitutional: Negative for fever, chills, diaphoresis, activity change, appetite change, fatigue and unexpected weight change.  HENT: Positive for congestion, rhinorrhea, sneezing and postnasal drip. Negative for hearing loss, ear pain, nosebleeds, sore throat, facial swelling, mouth sores, trouble swallowing, neck pain, neck stiffness, dental problem, voice change, sinus pressure, tinnitus and ear discharge.   Eyes: Negative for photophobia, discharge, itching and visual disturbance.  Respiratory: Positive for cough, chest tightness, shortness of breath and wheezing. Negative for apnea, choking and stridor.   Cardiovascular: Negative for chest pain, palpitations and leg swelling.  Gastrointestinal: Negative for nausea, vomiting, abdominal pain, constipation, blood in stool and abdominal distention.  Genitourinary: Negative for dysuria, urgency, frequency, hematuria, flank pain, decreased urine volume and difficulty urinating.  Musculoskeletal: Negative for myalgias, back pain, joint swelling, arthralgias and gait problem.  Skin: Negative for color change, pallor and rash.  Neurological: Negative for dizziness, tremors, seizures, syncope, speech difficulty, weakness, light-headedness, numbness and headaches.  Hematological: Negative for adenopathy. Does not bruise/bleed easily.  Psychiatric/Behavioral: Negative for confusion, sleep disturbance and agitation. The patient is not nervous/anxious.        Objective:   Physical Exam  Filed Vitals:   03/29/13 1417  BP: 102/62  Pulse: 74  Temp: 98.2 F (36.8 C)  TempSrc: Oral  Height: 5\' 7"  (1.702 m)  Weight: 157 lb (71.215 kg)  SpO2: 96%   Gen: well appearing, no acute distress HEENT: NCAT, PERRL, EOMi, OP clear, neck supple without masses PULM: exp wheezing bilaterally, good air movement, hyperresonant to percussion CV: RRR, no mgr, no JVD AB: BS+, soft,  nontender, no hsm Ext: warm, no edema, no clubbing, no cyanosis Derm: no rash or skin breakdown Neuro: A&Ox4, CN II-XII intact, strength 5/5 in all 4 extremities  03/29/2013 simple spirometry > ratio 65% FEV1 1.54L (60% pred)     Assessment & Plan:   COPD, 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 2011;155(3):179) recommends tobacco cessation, pulmonary rehab (for symptomatic patients with an FEV1 < 50% predicted), supplemental oxygen (for patients with SaO2 <88% or paO2 <55), and appropriate bronchodilator therapy.  In regards to long acting bronchodilators, they recommend monotherapy (FEV1 60-80% with symptoms weak evidence, FEV1 with symptoms <60% strong evidence), or combination therapy (FEV1 <60% with symptoms, strong recommendation, moderate evidence).  One should also provide patients with annual immunizations and consider therapy for prevention of COPD exacerbations (ie. roflumilast or azithromycin) when appopriate.  -O2 therapy: Not indicated -Immunizations: Next visit -Tobacco use: Advised at length to quit -Exercise: encourage regular exercise -Bronchodilator therapy: Start spiriva -Exacerbation prevention: spiriva   COPD exacerbation Anzlee's shortness of breath and wheezing in the setting of recent bronchitis are representative of a COPD exacerbation. She needs to have a chest x-ray to rule out another pulmonary infection such as pneumonia but I am not strongly suspicious of this.  I do not feel like starting prednisone is wise right now because of her poor glucose control. She should continue her current antibiotic.  I'm going to start Spiriva and ask her to use albuterol nebulized on a regular basis for the next few days to help her get through this. I've also asked  her to start using Mucinex. She was instructed to call us if she does  not have improvement in her symptoms by the end of the week and then she will likely need prednisone as well as another antibiotic.  She really needs to quit smoking.    Updated Medication List Outpatient Encounter Prescriptions as of 03/29/2013  Medication Sig Dispense Refill  . albuterol (PROVENTIL HFA;VENTOLIN HFA) 108 (90 BASE) MCG/ACT inhaler Inhale 2 puffs into the lungs every 6 (six) hours as needed for wheezing.  1 Inhaler  11  . amitriptyline (ELAVIL) 150 MG tablet TAKE 1 HOUR BEFORE BEDTIME EVERY NIGHT FOR INSOMNIA  30 tablet  2  . amoxicillin-clavulanate (AUGMENTIN) 875-125 MG per tablet Take 1 tablet by mouth 2 (two) times daily.  20 tablet  0  . aspirin 81 MG tablet Take 81 mg by mouth daily.        Marland Kitchen atorvastatin (LIPITOR) 40 MG tablet Take 1 tablet (40 mg total) by mouth daily.  90 tablet  3  . Calcium & Magnesium Carbonates (MYLANTA PO) Take by mouth.        . carvedilol (COREG) 6.25 MG tablet Take 1 tablet (6.25 mg total) by mouth 2 (two) times daily.  180 tablet  3  . clobetasol ointment (TEMOVATE) 0.05 % APPLY EVERY NIGHT TO VAGINAL FOLDS  30 g  0  . clopidogrel (PLAVIX) 75 MG tablet TAKE 1 TABLET (75 MG TOTAL) BY MOUTH DAILY.  90 tablet  3  . cyanocobalamin (,VITAMIN B-12,) 1000 MCG/ML injection INJECT 1 ML (1,000 MCG TOTAL) INTO THE MUSCLE ONCE.  1 mL  3  . donepezil (ARICEPT) 5 MG tablet Take 1 tablet (5 mg total) by mouth at bedtime.  90 tablet  3  . DULoxetine (CYMBALTA) 60 MG capsule TAKE 1 CAPSULE BY MOUTH DAILY.  30 capsule  2  . enalapril (VASOTEC) 10 MG tablet Take 1 tablet (10 mg total) by mouth daily.  90 tablet  1  . fluticasone (FLOVENT HFA) 220 MCG/ACT inhaler Inhale 1 puff into the lungs 2 (two) times daily.  1 Inhaler  12  . glipiZIDE (GLUCOTROL) 10 MG tablet Before breakfast and supper (evening meal)  180 tablet  3  . glucose blood (FREESTYLE LITE) test strip Check blood sugar 2 times  daily to check blood sugars  .,  Uncontrolled diabetes mellitus  100 each  11  . glucose monitoring kit (FREESTYLE) monitoring kit 1 each by Does not apply route as needed for other. PHARMACY PLEASE SUBSTITUTE WHATEVER GLUCOMETER YOU CARRY  1 each  1  . HYDROcodone-acetaminophen (NORCO/VICODIN) 5-325 MG per tablet Take 1 tablet by mouth every 6 (six) hours as needed for pain.  90 tablet  1  . insulin NPH (HUMULIN N,NOVOLIN N) 100 UNIT/ML injection Inject 30 Units into the skin 2 (two) times daily at 8 am and 10 pm.      . Insulin Syringe-Needle U-100 (INSULIN SYRINGE 1CC/28G) 28G X 1/2" 1 ML MISC For insulin administration  100 each  5  . isosorbide mononitrate (IMDUR) 30 MG 24 hr tablet Take 1 tablet (30 mg total) by mouth daily.  90 tablet  3  . lansoprazole (PREVACID 24HR) 15 MG capsule Take 1 capsule (15 mg total) by mouth daily.  90 capsule  3  . Levothyroxine Sodium 137 MCG CAPS Take 1 capsule (137 mcg total) by mouth daily before breakfast.  30 capsule  2  . magnesium oxide (MAG-OX) 400 MG tablet Take 400 mg by mouth daily.        . nitroGLYCERIN (NITROSTAT) 0.4 MG SL  tablet Place 1 tablet (0.4 mg total) under the tongue every 5 (five) minutes as needed.  30 tablet  1  . ondansetron (ZOFRAN-ODT) 8 MG disintegrating tablet Take 1 tablet (8 mg total) by mouth every 8 (eight) hours as needed for nausea.  20 tablet  0  . sitaGLIPtin (JANUVIA) 100 MG tablet Take 1 tablet (100 mg total) by mouth daily.  90 tablet  3  . traZODone (DESYREL) 100 MG tablet TAKE 1 TABLET (100 MG TOTAL) BY MOUTH AT BEDTIME.  90 tablet  2  . zolpidem (AMBIEN) 5 MG tablet Take 1 tablet (5 mg total) by mouth at bedtime as needed for sleep.  90 tablet  3  . albuterol (PROVENTIL) (2.5 MG/3ML) 0.083% nebulizer solution Take 3 mLs (2.5 mg total) by nebulization every 6 (six) hours as needed for wheezing.  75 mL  12  . tiotropium (SPIRIVA HANDIHALER) 18 MCG inhalation capsule Place 1 capsule (18 mcg total) into inhaler and inhale  daily.  30 capsule  2   No facility-administered encounter medications on file as of 03/29/2013.

## 2013-03-30 DIAGNOSIS — H25019 Cortical age-related cataract, unspecified eye: Secondary | ICD-10-CM | POA: Diagnosis not present

## 2013-03-30 LAB — HM DIABETES EYE EXAM

## 2013-04-01 ENCOUNTER — Telehealth: Payer: Self-pay | Admitting: Pulmonary Disease

## 2013-04-01 MED ORDER — LEVOFLOXACIN 500 MG PO TABS: 500.0000 mg | ORAL_TABLET | Freq: Every day | ORAL | Status: DC

## 2013-04-01 MED ORDER — PREDNISONE 10 MG PO TABS: ORAL_TABLET | ORAL | Status: DC

## 2013-04-01 NOTE — Telephone Encounter (Signed)
Spoke with the pt and notified of recs per Dr Vassie Loll She verbalized understanding and states nothing further needed Rxs were sent to pharm and pt instructed to call back for appt if not improving

## 2013-04-01 NOTE — Telephone Encounter (Signed)
Spoke with the pt She states feels no improvement since ov with BQ on 03/29/13 She is having increased SOB, wheezing, hoarseness and prod cough with large amounts of white sputum  She states that she has been taking amoxicillin x 2 wks  Declined appt since too far for her to drive  Please advise, thanks! No Known Allergies

## 2013-04-01 NOTE — Telephone Encounter (Signed)
Per BQ note, Prednisone 10 mg tabs  Take 2 tabs daily with food x 5ds, then 1 tab daily with food x 5ds then STOP This may affect her sugars levaquin 500 x 5 days She has to stop smoking

## 2013-04-05 ENCOUNTER — Telehealth: Payer: Self-pay | Admitting: Internal Medicine

## 2013-04-05 NOTE — Telephone Encounter (Signed)
Pt states she recently started Novolin and now cannot lift her arms.  Pt states she started having arm pain that has progressed to not being able to lift her left arm at all.  She can use her right arm a little.  States it is very painful.

## 2013-04-06 ENCOUNTER — Telehealth: Payer: Self-pay | Admitting: Pulmonary Disease

## 2013-04-06 ENCOUNTER — Encounter: Payer: Self-pay | Admitting: Adult Health

## 2013-04-06 ENCOUNTER — Ambulatory Visit (INDEPENDENT_AMBULATORY_CARE_PROVIDER_SITE_OTHER): Payer: Medicare Other | Admitting: Adult Health

## 2013-04-06 VITALS — BP 128/80 | HR 67 | Temp 98.0°F | Resp 12 | Wt 159.0 lb

## 2013-04-06 DIAGNOSIS — M791 Myalgia, unspecified site: Secondary | ICD-10-CM | POA: Insufficient documentation

## 2013-04-06 DIAGNOSIS — M79609 Pain in unspecified limb: Secondary | ICD-10-CM

## 2013-04-06 DIAGNOSIS — IMO0001 Reserved for inherently not codable concepts without codable children: Secondary | ICD-10-CM

## 2013-04-06 DIAGNOSIS — M79601 Pain in right arm: Secondary | ICD-10-CM

## 2013-04-06 MED ORDER — OXYCODONE-ACETAMINOPHEN 5-325 MG PO TABS: 1.0000 | ORAL_TABLET | ORAL | Status: DC | PRN

## 2013-04-06 NOTE — Telephone Encounter (Signed)
Tried to reach patient have been unable to reach have left message to call office with no return. FYI

## 2013-04-06 NOTE — Telephone Encounter (Signed)
Duplicate message. Pt already has been spoken too. Will sign off message

## 2013-04-06 NOTE — Progress Notes (Signed)
Subjective:    Patient ID: Sarah Payne, female    DOB: [birth year], 67 y.o.   MRN: [id number]  HPI  Patient is a pleasant 67 year old female who presents to clinic with complaints of severe left and right shoulder pain. On 03/24/2013 she was seen in clinic with complaint of recent sick contacts and feeling general malaise. She was started on Augmentin. She did not feel her symptoms improved on the Augmentin so she saw Dr. Kendrick Fries on 03/29/2013. He did not change her antibiotic at the time but reported that if her symptoms did not improve within a few days he would add a prednisone taper and an additional antibiotic. On 04/01/2013 she was still not feeling well and Dr. Kendrick Fries started her on a prednisone taper and Levaquin 500 mg x5 days. On 04/02/2013 she began to experience left shoulder pain and then on September 14 the pain was in both the left and right shoulder with inability to move her left arm. The right arm has a 20-30 abduction. She reports finishing the Levaquin yesterday. She stopped the prednisone on Monday thinking that symptoms may be coming from this medication. She denies pain elsewhere. She does not have any temporal pain or headache. She has not had any chest pain although patient has known cardiac history.   Current Outpatient Prescriptions on File Prior to Visit  Medication Sig Dispense Refill  . albuterol (PROVENTIL HFA;VENTOLIN HFA) 108 (90 BASE) MCG/ACT inhaler Inhale 2 puffs into the lungs every 6 (six) hours as needed for wheezing.  1 Inhaler  11  . albuterol (PROVENTIL) (2.5 MG/3ML) 0.083% nebulizer solution Take 3 mLs (2.5 mg total) by nebulization every 6 (six) hours as needed for wheezing.  75 mL  12  . amitriptyline (ELAVIL) 150 MG tablet TAKE 1 HOUR BEFORE BEDTIME EVERY NIGHT FOR INSOMNIA  30 tablet  2  . aspirin 81 MG tablet Take 81 mg by mouth daily.        Marland Kitchen atorvastatin (LIPITOR) 40 MG tablet Take 1 tablet (40 mg total) by mouth daily.  90 tablet  3  . Calcium &  Magnesium Carbonates (MYLANTA PO) Take by mouth.        . carvedilol (COREG) 6.25 MG tablet Take 1 tablet (6.25 mg total) by mouth 2 (two) times daily.  180 tablet  3  . clobetasol ointment (TEMOVATE) 0.05 % APPLY EVERY NIGHT TO VAGINAL FOLDS  30 g  0  . clopidogrel (PLAVIX) 75 MG tablet TAKE 1 TABLET (75 MG TOTAL) BY MOUTH DAILY.  90 tablet  3  . cyanocobalamin (,VITAMIN B-12,) 1000 MCG/ML injection INJECT 1 ML (1,000 MCG TOTAL) INTO THE MUSCLE ONCE.  1 mL  3  . donepezil (ARICEPT) 5 MG tablet Take 1 tablet (5 mg total) by mouth at bedtime.  90 tablet  3  . DULoxetine (CYMBALTA) 60 MG capsule TAKE 1 CAPSULE BY MOUTH DAILY.  30 capsule  2  . enalapril (VASOTEC) 10 MG tablet Take 1 tablet (10 mg total) by mouth daily.  90 tablet  1  . fluticasone (FLOVENT HFA) 220 MCG/ACT inhaler Inhale 1 puff into the lungs 2 (two) times daily.  1 Inhaler  12  . glipiZIDE (GLUCOTROL) 10 MG tablet Before breakfast and supper (evening meal)  180 tablet  3  . glucose blood (FREESTYLE LITE) test strip Check blood sugar 2 times daily to check blood sugars  .,  Uncontrolled diabetes mellitus  100 each  11  . glucose monitoring kit (FREESTYLE) monitoring kit  1 each by Does not apply route as needed for other. PHARMACY PLEASE SUBSTITUTE WHATEVER GLUCOMETER YOU CARRY  1 each  1  . HYDROcodone-acetaminophen (NORCO/VICODIN) 5-325 MG per tablet Take 1 tablet by mouth every 6 (six) hours as needed for pain.  90 tablet  1  . insulin NPH (HUMULIN N,NOVOLIN N) 100 UNIT/ML injection Inject 30 Units into the skin 2 (two) times daily at 8 am and 10 pm.      . Insulin Syringe-Needle U-100 (INSULIN SYRINGE 1CC/28G) 28G X 1/2" 1 ML MISC For insulin administration  100 each  5  . isosorbide mononitrate (IMDUR) 30 MG 24 hr tablet Take 1 tablet (30 mg total) by mouth daily.  90 tablet  3  . lansoprazole (PREVACID 24HR) 15 MG capsule Take 1 capsule (15 mg total) by mouth daily.  90 capsule  3  . Levothyroxine Sodium 137 MCG CAPS Take 1  capsule (137 mcg total) by mouth daily before breakfast.  30 capsule  2  . magnesium oxide (MAG-OX) 400 MG tablet Take 400 mg by mouth daily.        . nitroGLYCERIN (NITROSTAT) 0.4 MG SL tablet Place 1 tablet (0.4 mg total) under the tongue every 5 (five) minutes as needed.  30 tablet  1  . ondansetron (ZOFRAN-ODT) 8 MG disintegrating tablet Take 1 tablet (8 mg total) by mouth every 8 (eight) hours as needed for nausea.  20 tablet  0  . sitaGLIPtin (JANUVIA) 100 MG tablet Take 1 tablet (100 mg total) by mouth daily.  90 tablet  3  . tiotropium (SPIRIVA HANDIHALER) 18 MCG inhalation capsule Place 1 capsule (18 mcg total) into inhaler and inhale daily.  30 capsule  2  . traZODone (DESYREL) 100 MG tablet TAKE 1 TABLET (100 MG TOTAL) BY MOUTH AT BEDTIME.  90 tablet  2  . zolpidem (AMBIEN) 5 MG tablet Take 1 tablet (5 mg total) by mouth at bedtime as needed for sleep.  90 tablet  3   No current facility-administered medications on file prior to visit.     Review of Systems  HENT: Negative.   Respiratory: Negative.   Cardiovascular: Negative for chest pain and palpitations.  Gastrointestinal: Negative.   Musculoskeletal: Positive for myalgias and arthralgias.       Severe in left shoulder with inability to move arm. Right shoulder also extremely painful but not as severe as the right. Some movement of arm.  Neurological: Negative.   Psychiatric/Behavioral: Negative.   All other systems reviewed and are negative.       Objective:   Physical Exam  Constitutional: She is oriented to person, place, and time.  67 year old female appears uncomfortable and in pain  Cardiovascular: Normal rate, regular rhythm, normal heart sounds and intact distal pulses.  Exam reveals no gallop and no friction rub.   No murmur heard. EKG   Pulmonary/Chest: Effort normal and breath sounds normal. No respiratory distress. She has no wheezes. She has no rales.  Musculoskeletal: She exhibits no edema.  No point  tenderness bilateral shoulders. Inability to abduct left arm. Right arm abduction 20-30 - severe pain with abduction.  Neurological: She is alert and oriented to person, place, and time.  Skin: Skin is warm and dry. No rash noted. No erythema.  Psychiatric: She has a normal mood and affect. Her behavior is normal. Thought content normal.    BP 128/80  Pulse 67  Temp(Src) 98 F (36.7 C) (Oral)  Resp 12  Wt 159 lb (  72.122 kg)  BMI 24.9 kg/m2  SpO2 97%     Assessment & Plan:

## 2013-04-06 NOTE — Assessment & Plan Note (Addendum)
?   Etiology - patient recently started on prednisone taper and levaquin both of which can cause myalgias. Finished levaquin yesterday. She stopped prednisone on Monday thinking it was that. EKG today. Reviewed by Dr. Mariah Milling - shows no acute changes from 03/24/13. Warm compress to area. Rest, immobilize, elevate arms on pillow for support. Percocet short course. RTC if no improvement in 1 week. She has a follow up appt with Dr. Darrick Huntsman on 04/11/13.

## 2013-04-06 NOTE — Telephone Encounter (Signed)
Pt in office seeing Orville Govern, NP

## 2013-04-06 NOTE — Patient Instructions (Signed)
  Percocet 1 tablet every 4 hours as needed for severe pain in your shoulders.  Warm compresses to the shoulders. You can also try applying some ice. Do this for 15-20 min, 3-4 times a day.  Rest your arms. You can support your arms on pillows when you lie down.  Call if your symptoms do not improve within 1 week or sooner if symptoms worsen.

## 2013-04-06 NOTE — Telephone Encounter (Signed)
Did not realize patient had appointment with Orville Govern NP.

## 2013-04-06 NOTE — Telephone Encounter (Signed)
I spoke with pt. She stated Friday she started prednisone. Saturday night her left arm/shoulder and her right arm was in severe pain and could barely move her arms. She stated today her left arm she can not even raise it up it is in that much pain. She took 3 norco yesterday and it did not phase her pain. I advised pt she needs to call her PCP and get in to see them if she is in that much pain. She will do so and nothing further needed

## 2013-04-08 ENCOUNTER — Encounter: Payer: Self-pay | Admitting: *Deleted

## 2013-04-08 ENCOUNTER — Encounter: Payer: Self-pay | Admitting: Internal Medicine

## 2013-04-11 ENCOUNTER — Ambulatory Visit (INDEPENDENT_AMBULATORY_CARE_PROVIDER_SITE_OTHER): Payer: Medicare Other | Admitting: Internal Medicine

## 2013-04-11 ENCOUNTER — Encounter: Payer: Self-pay | Admitting: Internal Medicine

## 2013-04-11 VITALS — BP 128/78 | HR 73 | Temp 97.8°F | Resp 14 | Ht 67.0 in | Wt 161.0 lb

## 2013-04-11 DIAGNOSIS — E1159 Type 2 diabetes mellitus with other circulatory complications: Secondary | ICD-10-CM | POA: Diagnosis not present

## 2013-04-11 DIAGNOSIS — E1151 Type 2 diabetes mellitus with diabetic peripheral angiopathy without gangrene: Secondary | ICD-10-CM

## 2013-04-11 DIAGNOSIS — I739 Peripheral vascular disease, unspecified: Secondary | ICD-10-CM

## 2013-04-11 DIAGNOSIS — M791 Myalgia, unspecified site: Secondary | ICD-10-CM

## 2013-04-11 DIAGNOSIS — K2970 Gastritis, unspecified, without bleeding: Secondary | ICD-10-CM | POA: Diagnosis not present

## 2013-04-11 DIAGNOSIS — IMO0001 Reserved for inherently not codable concepts without codable children: Secondary | ICD-10-CM | POA: Diagnosis not present

## 2013-04-11 DIAGNOSIS — M25519 Pain in unspecified shoulder: Secondary | ICD-10-CM | POA: Diagnosis not present

## 2013-04-11 DIAGNOSIS — M25512 Pain in left shoulder: Secondary | ICD-10-CM

## 2013-04-11 DIAGNOSIS — M25529 Pain in unspecified elbow: Secondary | ICD-10-CM

## 2013-04-11 DIAGNOSIS — M25522 Pain in left elbow: Secondary | ICD-10-CM

## 2013-04-11 MED ORDER — GABAPENTIN 100 MG PO CAPS: 100.0000 mg | ORAL_CAPSULE | Freq: Three times a day (TID) | ORAL | Status: DC

## 2013-04-11 NOTE — Progress Notes (Signed)
Patient ID: Sarah Payne, female   DOB: 10-11-1945, 67 y.o.   MRN: [id number]  Patient Active Problem List   Diagnosis Date Noted  . Pain in joint, upper arm 04/12/2013  . Myalgia 04/06/2013  . COPD exacerbation 03/29/2013  . COPD, moderate 03/29/2013  . Sweating 03/24/2013  . Malaise 03/24/2013  . Noncompliance with diabetes treatment 02/27/2013  . Unspecified gastritis and gastroduodenitis without mention of hemorrhage 02/27/2013  . Obstructive sleep apnea 02/25/2013  . Leg weakness, bilateral 10/07/2012  . Other malaise and fatigue 10/06/2012  . Chronic systolic congestive heart failure 05/21/2012  . Lichen sclerosus et atrophicus 05/20/2012  . Bronchitis, chronic obstructive 05/20/2012  . Headache 10/15/2011  . Angina at rest 09/25/2011  . Diabetes mellitus type 2 with peripheral artery disease 08/28/2011  . History of lumbar discectomy 06/29/2011  . Depression 06/24/2011  . Hypothyroidism 05/03/2011  . CAD, multiple vessel 02/24/2011  . Tobacco abuse 02/24/2011  . Hyperlipidemia 02/24/2011    Subjective:  CC:   Chief Complaint  Patient presents with  . Follow-up    Saw Nurse practioner on 04/06/13 .Cannot lift left arm nausea and vomiting.    HPI:   Sarah Payne a 67 y.o. female who presents  Past Medical History  Diagnosis Date  . Diabetes mellitus   . Hypertension   . Hypothyroidism   . Depression   . Vascular dementia   . Tobacco abuse     a. 45 pack year history - quit 09/16/11  . Chronic low back pain   . Coronary artery disease     a. s/p PCI/DES LCX x 3;  b. NSTEMI 09/16/11;  c.   09/17/11 [facility]) Severe RCA/LCX dzs - RCA treated, LCX intervention pending w/ Dr. Juliann Pares    Past Surgical History  Procedure Laterality Date  . Back surgery    . Abdominal hysterectomy    . Appendectomy    . Cardiac catheterization  2013  . Coronary angioplasty  2012    stent x 3   . Coronary angioplasty with stent placement  2013       The following  portions of the patient's history were reviewed and updated as appropriate: Allergies, current medications, and problem list.    Review of Systems:   12 Pt  review of systems was negative except those addressed in the HPI,     History   Social History  . Marital Status: Married    Spouse Name: N/A    Number of Children: N/A  . Years of Education: N/A   Occupational History  . Not on file.   Social History Main Topics  . Smoking status: Current Every Day Smoker -- 0.25 packs/day    Types: Cigarettes  . Smokeless tobacco: Never Used     Comment: Has cut back, trying to quit. Using the E-cigarette.  . Alcohol Use: No  . Drug Use: No  . Sexual Activity: Not on file   Other Topics Concern  . Not on file   Social History Narrative   Lives at home with her husband in [place].  Previously used marijuana - quit.    Objective:  Filed Vitals:   04/11/13 1536  BP: 128/78  Pulse: 73  Temp: 97.8 F (36.6 C)  Resp: 14     General appearance: alert, cooperative and appears stated age Ears: normal TM's and external ear canals both ears Throat: lips, mucosa, and tongue normal; teeth and gums normal Neck: no adenopathy, no carotid bruit,  supple, symmetrical, trachea midline and thyroid not enlarged, symmetric, no tenderness/mass/nodules Back: symmetric, no curvature. ROM normal. No CVA tenderness. Lungs: clear to auscultation bilaterally Heart: regular rate and rhythm, S1, S2 normal, no murmur, click, rub or gallop Abdomen: soft, non-tender; bowel sounds normal; no masses,  no organomegaly Pulses: 2+ and symmetric Skin: Skin color, texture, turgor normal. No rashes or lesions Lymph nodes: Cervical, supraclavicular, and axillary nodes normal.  Assessment and Plan:  Unspecified gastritis and gastroduodenitis without mention of hemorrhage She has a history of gastric erosions by EGD done in 2008. She was prescribed Prevacid but it is unclear whether she is taking this or  not. I will presume that she is able escalate therapy to Nexium 30 mg daily. 3 weeks of samples given. If symptoms do not improve she will need repeat EGD.  Diabetes mellitus type 2 with peripheral artery disease Uncontrolled, most recent A1c over 11. Patient and family were advised that if they cannot comply with treatment she will be referred to endocrinology. She returns today without a log of her blood sugars but with reports of recurrent hypoglycemia occurring in the afternoon. I have reduced her morning dose of NPH from 30 units to 25 units. Continue 30 units in the evening and return in 2 weeks.  Pain in joint, upper arm The cause of her shoulder pain is unclear. It is unlikely to be severe arthritis since it worsened while taking prednisone. She is now having nausea using ibuprofen. I've asked her stop ibuprofen, elavil and ambien and start gabapentin 100 mg tid and continue the Vicodin as needed. Plain films, CK, the sedimentation rate and CRP all ordered. Referral to neurology for consideration of statin-induced myopathy.  A total of 40 minutes, more than half of which was face to face time, was spent in evaluation and treatment of patient, including reviewing records from other providers and recent laboratory data.   Updated Medication List Outpatient Encounter Prescriptions as of 04/11/2013  Medication Sig Dispense Refill  . albuterol (PROVENTIL HFA;VENTOLIN HFA) 108 (90 BASE) MCG/ACT inhaler Inhale 2 puffs into the lungs every 6 (six) hours as needed for wheezing.  1 Inhaler  11  . albuterol (PROVENTIL) (2.5 MG/3ML) 0.083% nebulizer solution Take 3 mLs (2.5 mg total) by nebulization every 6 (six) hours as needed for wheezing.  75 mL  12  . aspirin 81 MG tablet Take 81 mg by mouth daily.        . Calcium & Magnesium Carbonates (MYLANTA PO) Take by mouth.        . carvedilol (COREG) 6.25 MG tablet Take 1 tablet (6.25 mg total) by mouth 2 (two) times daily.  180 tablet  3  . clobetasol  ointment (TEMOVATE) 0.05 % APPLY EVERY NIGHT TO VAGINAL FOLDS  30 g  0  . clopidogrel (PLAVIX) 75 MG tablet TAKE 1 TABLET (75 MG TOTAL) BY MOUTH DAILY.  90 tablet  3  . cyanocobalamin (,VITAMIN B-12,) 1000 MCG/ML injection INJECT 1 ML (1,000 MCG TOTAL) INTO THE MUSCLE ONCE.  1 mL  3  . donepezil (ARICEPT) 5 MG tablet Take 1 tablet (5 mg total) by mouth at bedtime.  90 tablet  3  . DULoxetine (CYMBALTA) 60 MG capsule TAKE 1 CAPSULE BY MOUTH DAILY.  30 capsule  2  . enalapril (VASOTEC) 10 MG tablet Take 1 tablet (10 mg total) by mouth daily.  90 tablet  1  . fluticasone (FLOVENT HFA) 220 MCG/ACT inhaler Inhale 1 puff into the lungs 2 (  two) times daily.  1 Inhaler  12  . glipiZIDE (GLUCOTROL) 10 MG tablet Before breakfast and supper (evening meal)  180 tablet  3  . glucose blood (FREESTYLE LITE) test strip Check blood sugar 2 times daily to check blood sugars  .,  Uncontrolled diabetes mellitus  100 each  11  . glucose monitoring kit (FREESTYLE) monitoring kit 1 each by Does not apply route as needed for other. PHARMACY PLEASE SUBSTITUTE WHATEVER GLUCOMETER YOU CARRY  1 each  1  . HYDROcodone-acetaminophen (NORCO/VICODIN) 5-325 MG per tablet Take 1 tablet by mouth every 6 (six) hours as needed for pain.  90 tablet  1  . insulin NPH (HUMULIN N,NOVOLIN N) 100 UNIT/ML injection Inject 30 Units into the skin 2 (two) times daily at 8 am and 10 pm.      . Insulin Syringe-Needle U-100 (INSULIN SYRINGE 1CC/28G) 28G X 1/2" 1 ML MISC For insulin administration  100 each  5  . isosorbide mononitrate (IMDUR) 30 MG 24 hr tablet Take 1 tablet (30 mg total) by mouth daily.  90 tablet  3  . Levothyroxine Sodium 137 MCG CAPS Take 1 capsule (137 mcg total) by mouth daily before breakfast.  30 capsule  2  . magnesium oxide (MAG-OX) 400 MG tablet Take 400 mg by mouth daily.        . nitroGLYCERIN (NITROSTAT) 0.4 MG SL tablet Place 1 tablet (0.4 mg total) under the tongue every 5 (five) minutes as needed.  30 tablet  1  .  ondansetron (ZOFRAN-ODT) 8 MG disintegrating tablet Take 1 tablet (8 mg total) by mouth every 8 (eight) hours as needed for nausea.  20 tablet  0  . sitaGLIPtin (JANUVIA) 100 MG tablet Take 1 tablet (100 mg total) by mouth daily.  90 tablet  3  . tiotropium (SPIRIVA HANDIHALER) 18 MCG inhalation capsule Place 1 capsule (18 mcg total) into inhaler and inhale daily.  30 capsule  2  . traZODone (DESYREL) 100 MG tablet TAKE 1 TABLET (100 MG TOTAL) BY MOUTH AT BEDTIME.  90 tablet  2  . [DISCONTINUED] amitriptyline (ELAVIL) 150 MG tablet TAKE 1 HOUR BEFORE BEDTIME EVERY NIGHT FOR INSOMNIA  30 tablet  2  . [DISCONTINUED] atorvastatin (LIPITOR) 40 MG tablet Take 1 tablet (40 mg total) by mouth daily.  90 tablet  3  . [DISCONTINUED] lansoprazole (PREVACID 24HR) 15 MG capsule Take 1 capsule (15 mg total) by mouth daily.  90 capsule  3  . [DISCONTINUED] zolpidem (AMBIEN) 5 MG tablet Take 1 tablet (5 mg total) by mouth at bedtime as needed for sleep.  90 tablet  3  . gabapentin (NEURONTIN) 100 MG capsule Take 1 capsule (100 mg total) by mouth 3 (three) times daily.  90 capsule  3  . [DISCONTINUED] oxyCODONE-acetaminophen (PERCOCET/ROXICET) 5-325 MG per tablet Take 1 tablet by mouth every 4 (four) hours as needed for pain.  45 tablet  0   No facility-administered encounter medications on file as of 04/11/2013.     Orders Placed This Encounter  Procedures  . DG Shoulder Left  . CK  . Aldolase  . Hepatic function panel  . Sedimentation rate  . C-reactive protein    No Follow-up on file.

## 2013-04-11 NOTE — Patient Instructions (Addendum)
For your diabetes:   Your afternoon sugars are too low.    Please decrease your insulin dose to 25 units it the morning and continue 30 units in the evening.    For your shoulder pain :   Trial of gabapentin 100 mg three times daily   To see if it helps your arm pain    Continue the hydrocdone    Stop the elavil (amitryptiline) and the [employer] today and x rays of  Both shoulders when you are feeling better     Neurology evaluation   For your acid reflux and nausea:   Stop the prevacid and start nexium once daily

## 2013-04-12 ENCOUNTER — Encounter: Payer: Self-pay | Admitting: Internal Medicine

## 2013-04-12 ENCOUNTER — Ambulatory Visit: Payer: Self-pay | Admitting: Internal Medicine

## 2013-04-12 DIAGNOSIS — M25529 Pain in unspecified elbow: Secondary | ICD-10-CM | POA: Insufficient documentation

## 2013-04-12 DIAGNOSIS — M19019 Primary osteoarthritis, unspecified shoulder: Secondary | ICD-10-CM | POA: Diagnosis not present

## 2013-04-12 LAB — HEPATIC FUNCTION PANEL
ALT: 11 U/L (ref 0–35)
AST: 16 U/L (ref 0–37)
Albumin: 3.4 g/dL — ABNORMAL LOW (ref 3.5–5.2)
Alkaline Phosphatase: 71 U/L (ref 39–117)
Bilirubin, Direct: 0.1 mg/dL (ref 0.0–0.3)
Total Bilirubin: 0.3 mg/dL (ref 0.3–1.2)
Total Protein: 6.4 g/dL (ref 6.0–8.3)

## 2013-04-12 LAB — C-REACTIVE PROTEIN: CRP: 2.1 mg/dL (ref 0.5–20.0)

## 2013-04-12 LAB — CK: Total CK: 199 U/L — ABNORMAL HIGH (ref 7–177)

## 2013-04-12 LAB — SEDIMENTATION RATE: Sed Rate: 58 mm/hr — ABNORMAL HIGH (ref 0–22)

## 2013-04-12 NOTE — Assessment & Plan Note (Signed)
Uncontrolled, most recent A1c over 11. Patient and family were advised that if they cannot comply with treatment she will be referred to endocrinology. She returns today without a log of her blood sugars but with reports of recurrent hypoglycemia occurring in the afternoon. I have reduced her morning dose of NPH from 30 units to 25 units. Continue 30 units in the evening and return in 2 weeks.

## 2013-04-12 NOTE — Assessment & Plan Note (Signed)
She has a history of gastric erosions by EGD done in 2008. She was prescribed Prevacid but it is unclear whether she is taking this or not. I will presume that she is able escalate therapy to Nexium 30 mg daily. 3 weeks of samples given. If symptoms do not improve she will need repeat EGD.

## 2013-04-12 NOTE — Assessment & Plan Note (Signed)
The cause of her shoulder pain is unclear. It is unlikely to be severe arthritis since it worsened while taking prednisone. She is now having nausea using ibuprofen. I've asked her stop ibuprofen, elavil and ambien and start gabapentin 100 mg tid and continue the Vicodin as needed. Plain films, CK, the sedimentation rate and CRP all ordered. Referral to neurology for consideration of statin-induced myopathy.

## 2013-04-13 LAB — ALDOLASE: Aldolase: 6 U/L (ref <=8.1)

## 2013-04-15 ENCOUNTER — Telehealth: Payer: Self-pay | Admitting: Internal Medicine

## 2013-04-15 NOTE — Telephone Encounter (Signed)
X rays of left shoulder show degenerative changes only.  n fracture.  Neurology referral is in process.

## 2013-04-15 NOTE — Telephone Encounter (Signed)
Patient notified as requested and patient stated shoulder pain is some better compared to last visit.

## 2013-04-20 ENCOUNTER — Ambulatory Visit (INDEPENDENT_AMBULATORY_CARE_PROVIDER_SITE_OTHER): Payer: Medicare Other | Admitting: *Deleted

## 2013-04-20 DIAGNOSIS — Z23 Encounter for immunization: Secondary | ICD-10-CM | POA: Diagnosis not present

## 2013-04-26 ENCOUNTER — Telehealth: Payer: Self-pay | Admitting: Internal Medicine

## 2013-04-26 NOTE — Telephone Encounter (Signed)
Pt left vm, states her arm hurts, could not move it at all yesterday.  Asking if we can give her a shot for the pain or if she will have to go to ER.  States the hydrocodone is not helping.

## 2013-04-26 NOTE — Telephone Encounter (Signed)
Called verified patient  Left shoulder pain has returned pain is much worse and would like to see the NP appointment scheduled. FYI

## 2013-04-27 ENCOUNTER — Encounter (INDEPENDENT_AMBULATORY_CARE_PROVIDER_SITE_OTHER): Payer: Self-pay

## 2013-04-27 ENCOUNTER — Ambulatory Visit (INDEPENDENT_AMBULATORY_CARE_PROVIDER_SITE_OTHER): Payer: Medicare Other | Admitting: Adult Health

## 2013-04-27 ENCOUNTER — Encounter: Payer: Self-pay | Admitting: Adult Health

## 2013-04-27 VITALS — BP 118/78 | HR 82 | Temp 98.0°F | Resp 12 | Wt 161.0 lb

## 2013-04-27 DIAGNOSIS — M25519 Pain in unspecified shoulder: Secondary | ICD-10-CM | POA: Diagnosis not present

## 2013-04-27 DIAGNOSIS — Z79899 Other long term (current) drug therapy: Secondary | ICD-10-CM | POA: Diagnosis not present

## 2013-04-27 DIAGNOSIS — M25512 Pain in left shoulder: Secondary | ICD-10-CM

## 2013-04-27 DIAGNOSIS — M25522 Pain in left elbow: Secondary | ICD-10-CM

## 2013-04-27 DIAGNOSIS — M25529 Pain in unspecified elbow: Secondary | ICD-10-CM

## 2013-04-27 MED ORDER — OXYCODONE-ACETAMINOPHEN 5-325 MG PO TABS: 1.0000 | ORAL_TABLET | Freq: Four times a day (QID) | ORAL | Status: DC | PRN

## 2013-04-27 NOTE — Progress Notes (Signed)
Subjective:    Patient ID: Sarah Payne, female    DOB: 13-Apr-1946, 67 y.o.   MRN: [id number]  HPI  Patient is a pleasant 67 year old female who presents to clinic for followup of left shoulder pain. She was first seen in clinic in the middle of September with new onset left shoulder pain. At the time she denied any injury. She had even stated that she thought it was coming from medication which she had been started on. Patient had been referred to neurology thinking that this pain was possibly myalgia secondary to statin medication. Today the patient "comes clean" with the real reason for her left shoulder pain. She reports that she was having intercourse with her husband which she had not done so in over 4 years. She rolled over on her shoulder and felt something snap. The pain has been ongoing since then. She reports that she did not say this initially because she was too embarrassed. After laughing about it, I explained that it was important for her to tell us what happened because we were trying to figure out why she would have a spontaneous, sudden onset pain without any reported injury.  Current Outpatient Prescriptions on File Prior to Visit  Medication Sig Dispense Refill  . albuterol (PROVENTIL HFA;VENTOLIN HFA) 108 (90 BASE) MCG/ACT inhaler Inhale 2 puffs into the lungs every 6 (six) hours as needed for wheezing.  1 Inhaler  11  . albuterol (PROVENTIL) (2.5 MG/3ML) 0.083% nebulizer solution Take 3 mLs (2.5 mg total) by nebulization every 6 (six) hours as needed for wheezing.  75 mL  12  . aspirin 81 MG tablet Take 81 mg by mouth daily.        . Calcium & Magnesium Carbonates (MYLANTA PO) Take by mouth.        . carvedilol (COREG) 6.25 MG tablet Take 1 tablet (6.25 mg total) by mouth 2 (two) times daily.  180 tablet  3  . clobetasol ointment (TEMOVATE) 0.05 % APPLY EVERY NIGHT TO VAGINAL FOLDS  30 g  0  . clopidogrel (PLAVIX) 75 MG tablet TAKE 1 TABLET (75 MG TOTAL) BY MOUTH DAILY.  90  tablet  3  . cyanocobalamin (,VITAMIN B-12,) 1000 MCG/ML injection INJECT 1 ML (1,000 MCG TOTAL) INTO THE MUSCLE ONCE.  1 mL  3  . donepezil (ARICEPT) 5 MG tablet Take 1 tablet (5 mg total) by mouth at bedtime.  90 tablet  3  . DULoxetine (CYMBALTA) 60 MG capsule TAKE 1 CAPSULE BY MOUTH DAILY.  30 capsule  2  . enalapril (VASOTEC) 10 MG tablet Take 1 tablet (10 mg total) by mouth daily.  90 tablet  1  . fluticasone (FLOVENT HFA) 220 MCG/ACT inhaler Inhale 1 puff into the lungs 2 (two) times daily.  1 Inhaler  12  . gabapentin (NEURONTIN) 100 MG capsule Take 1 capsule (100 mg total) by mouth 3 (three) times daily.  90 capsule  3  . glipiZIDE (GLUCOTROL) 10 MG tablet Before breakfast and supper (evening meal)  180 tablet  3  . glucose blood (FREESTYLE LITE) test strip Check blood sugar 2 times daily to check blood sugars  .,  Uncontrolled diabetes mellitus  100 each  11  . glucose monitoring kit (FREESTYLE) monitoring kit 1 each by Does not apply route as needed for other. PHARMACY PLEASE SUBSTITUTE WHATEVER GLUCOMETER YOU CARRY  1 each  1  . HYDROcodone-acetaminophen (NORCO/VICODIN) 5-325 MG per tablet Take 1 tablet by mouth every 6 (six) hours  as needed for pain.  90 tablet  1  . insulin NPH (HUMULIN N,NOVOLIN N) 100 UNIT/ML injection Inject 30 Units into the skin 2 (two) times daily at 8 am and 10 pm.      . Insulin Syringe-Needle U-100 (INSULIN SYRINGE 1CC/28G) 28G X 1/2" 1 ML MISC For insulin administration  100 each  5  . isosorbide mononitrate (IMDUR) 30 MG 24 hr tablet Take 1 tablet (30 mg total) by mouth daily.  90 tablet  3  . Levothyroxine Sodium 137 MCG CAPS Take 1 capsule (137 mcg total) by mouth daily before breakfast.  30 capsule  2  . magnesium oxide (MAG-OX) 400 MG tablet Take 400 mg by mouth daily.        . nitroGLYCERIN (NITROSTAT) 0.4 MG SL tablet Place 1 tablet (0.4 mg total) under the tongue every 5 (five) minutes as needed.  30 tablet  1  . ondansetron (ZOFRAN-ODT) 8 MG  disintegrating tablet Take 1 tablet (8 mg total) by mouth every 8 (eight) hours as needed for nausea.  20 tablet  0  . sitaGLIPtin (JANUVIA) 100 MG tablet Take 1 tablet (100 mg total) by mouth daily.  90 tablet  3  . tiotropium (SPIRIVA HANDIHALER) 18 MCG inhalation capsule Place 1 capsule (18 mcg total) into inhaler and inhale daily.  30 capsule  2  . traZODone (DESYREL) 100 MG tablet TAKE 1 TABLET (100 MG TOTAL) BY MOUTH AT BEDTIME.  90 tablet  2   No current facility-administered medications on file prior to visit.     Review of Systems  Musculoskeletal:       Left shoulder pain. Improved slightly.       Objective:   Physical Exam  Musculoskeletal:  Considerable left shoulder pain with abduction. Arm in sling          Assessment & Plan:

## 2013-04-27 NOTE — Assessment & Plan Note (Signed)
Apply ice to the area for 15 min 3-4 times a day. Percocet for pain. Refer to Ortho.

## 2013-04-29 DIAGNOSIS — M25519 Pain in unspecified shoulder: Secondary | ICD-10-CM | POA: Diagnosis not present

## 2013-05-02 ENCOUNTER — Telehealth: Payer: Self-pay | Admitting: Internal Medicine

## 2013-05-02 ENCOUNTER — Encounter: Payer: Self-pay | Admitting: Internal Medicine

## 2013-05-02 NOTE — Telephone Encounter (Signed)
I cannot give her anything stronger than oxycodone which Raquel gave her last week during her visit. I understand that she has finally admitted that the pain occurred after hearing a "snap" when she rolled over,  Which means she may have ruptured a tendon.  If she needs something stronger than oxycodone she will have to go to the ER.  I will, however, order an MRI of the shoulder to determine what has been ruptured, if she is willing to have it.

## 2013-05-02 NOTE — Telephone Encounter (Signed)
Pt states she went to see Dr. Claris Gladden At [facility] about her arm pain.  Pt states the doctor tried to raise her arm over her head and the pt had to grab her arm to stop her due to the pain.  Pt states that was the most hateful doctor she has ever met.  Pt states the doctor would not help her and would not give her any pain medication.  Pt states she is in so much pain she even thought about taking bunch of sleeping pills so this all would go away.  States she has been in bed all weekend.  Pt would like a call to discuss this.

## 2013-05-02 NOTE — Telephone Encounter (Signed)
Patient is wanting stronger pain medication.

## 2013-05-03 ENCOUNTER — Emergency Department: Payer: Self-pay | Admitting: Emergency Medicine

## 2013-05-03 DIAGNOSIS — I252 Old myocardial infarction: Secondary | ICD-10-CM | POA: Diagnosis not present

## 2013-05-03 DIAGNOSIS — IMO0002 Reserved for concepts with insufficient information to code with codable children: Secondary | ICD-10-CM | POA: Diagnosis not present

## 2013-05-03 DIAGNOSIS — K219 Gastro-esophageal reflux disease without esophagitis: Secondary | ICD-10-CM | POA: Diagnosis not present

## 2013-05-03 DIAGNOSIS — Z9889 Other specified postprocedural states: Secondary | ICD-10-CM | POA: Diagnosis not present

## 2013-05-03 DIAGNOSIS — Z794 Long term (current) use of insulin: Secondary | ICD-10-CM | POA: Diagnosis not present

## 2013-05-03 DIAGNOSIS — I10 Essential (primary) hypertension: Secondary | ICD-10-CM | POA: Diagnosis not present

## 2013-05-03 DIAGNOSIS — E785 Hyperlipidemia, unspecified: Secondary | ICD-10-CM | POA: Diagnosis not present

## 2013-05-03 DIAGNOSIS — M542 Cervicalgia: Secondary | ICD-10-CM | POA: Diagnosis not present

## 2013-05-03 DIAGNOSIS — Z95818 Presence of other cardiac implants and grafts: Secondary | ICD-10-CM | POA: Diagnosis not present

## 2013-05-03 DIAGNOSIS — T148XXA Other injury of unspecified body region, initial encounter: Secondary | ICD-10-CM | POA: Diagnosis not present

## 2013-05-03 DIAGNOSIS — M19019 Primary osteoarthritis, unspecified shoulder: Secondary | ICD-10-CM | POA: Diagnosis not present

## 2013-05-03 DIAGNOSIS — Z79899 Other long term (current) drug therapy: Secondary | ICD-10-CM | POA: Diagnosis not present

## 2013-05-03 DIAGNOSIS — E119 Type 2 diabetes mellitus without complications: Secondary | ICD-10-CM | POA: Diagnosis not present

## 2013-05-03 DIAGNOSIS — F411 Generalized anxiety disorder: Secondary | ICD-10-CM | POA: Diagnosis not present

## 2013-05-03 NOTE — Telephone Encounter (Signed)
Spoke with pt, had gone to ED and got injection for pain. Has an MRI scheduled for tomorrow though Dr. Claris Gladden.

## 2013-05-07 ENCOUNTER — Emergency Department: Payer: Self-pay | Admitting: Internal Medicine

## 2013-05-07 DIAGNOSIS — E119 Type 2 diabetes mellitus without complications: Secondary | ICD-10-CM | POA: Diagnosis not present

## 2013-05-07 DIAGNOSIS — Z79899 Other long term (current) drug therapy: Secondary | ICD-10-CM | POA: Diagnosis not present

## 2013-05-07 DIAGNOSIS — E7800 Pure hypercholesterolemia, unspecified: Secondary | ICD-10-CM | POA: Diagnosis not present

## 2013-05-07 DIAGNOSIS — I10 Essential (primary) hypertension: Secondary | ICD-10-CM | POA: Diagnosis not present

## 2013-05-07 DIAGNOSIS — S4980XA Other specified injuries of shoulder and upper arm, unspecified arm, initial encounter: Secondary | ICD-10-CM | POA: Diagnosis not present

## 2013-05-07 DIAGNOSIS — Z9889 Other specified postprocedural states: Secondary | ICD-10-CM | POA: Diagnosis not present

## 2013-05-07 DIAGNOSIS — Z7982 Long term (current) use of aspirin: Secondary | ICD-10-CM | POA: Diagnosis not present

## 2013-05-07 DIAGNOSIS — I252 Old myocardial infarction: Secondary | ICD-10-CM | POA: Diagnosis not present

## 2013-05-11 ENCOUNTER — Ambulatory Visit: Payer: Self-pay | Admitting: Orthopedic Surgery

## 2013-05-11 DIAGNOSIS — S46919A Strain of unspecified muscle, fascia and tendon at shoulder and upper arm level, unspecified arm, initial encounter: Secondary | ICD-10-CM | POA: Diagnosis not present

## 2013-05-11 DIAGNOSIS — M19019 Primary osteoarthritis, unspecified shoulder: Secondary | ICD-10-CM | POA: Diagnosis not present

## 2013-05-11 DIAGNOSIS — M67919 Unspecified disorder of synovium and tendon, unspecified shoulder: Secondary | ICD-10-CM | POA: Diagnosis not present

## 2013-05-11 DIAGNOSIS — M25519 Pain in unspecified shoulder: Secondary | ICD-10-CM | POA: Diagnosis not present

## 2013-05-13 ENCOUNTER — Telehealth: Payer: Self-pay | Admitting: Internal Medicine

## 2013-05-13 DIAGNOSIS — M7500 Adhesive capsulitis of unspecified shoulder: Secondary | ICD-10-CM | POA: Diagnosis not present

## 2013-05-13 NOTE — Telephone Encounter (Signed)
Left message for patient to return my call.

## 2013-05-13 NOTE — Telephone Encounter (Signed)
I will not prescribe oxycodone for her shoulder pain.  She needs to stick with Dr Lennox Grumbles for treatment of the shoulder.  Regarding the blood sugars and medication plam given her history of not following directions,  She will need to e mail or call in her blood sugars every 3 days and I will adjust her diabetes medications by phone.

## 2013-05-13 NOTE — Telephone Encounter (Signed)
Pt came in today wanting to talk to cathy.  Ms Crawshaw went to see shalini ramasunder @ [facility] clinic  Dr Claris Gladden does not prescribe any kind of pain meds unless she does surgery.   1)Dr ramasunder wanted to know if ms Worthington could do steriod injection  If yes needs a medication plan if pt blood sugar rises. 2) Can pt take nsaid for 1 month Pt wanted to know if dr Darrick Huntsman would prescribe oxicodone for her shoulder pain

## 2013-05-13 NOTE — Telephone Encounter (Signed)
Patient on telephone conversation is being coached by someone as to identifying pain issue and as to what medication to ask for, I could hear in background someone coach patient to ask for oxycodone and that she could not this pain you will kill yourself. Please advise.

## 2013-05-17 ENCOUNTER — Telehealth: Payer: Self-pay

## 2013-05-17 ENCOUNTER — Telehealth: Payer: Self-pay | Admitting: Internal Medicine

## 2013-05-17 DIAGNOSIS — M25512 Pain in left shoulder: Secondary | ICD-10-CM

## 2013-05-17 MED ORDER — HYDROCODONE-ACETAMINOPHEN 5-325 MG PO TABS: 1.0000 | ORAL_TABLET | Freq: Four times a day (QID) | ORAL | Status: DC | PRN

## 2013-05-17 NOTE — Telephone Encounter (Signed)
Spoke w/ pt.  She is crying and very upset, as she has "frozen shoulder syndrome and the doctor told me it's the worst pain that you can have." Reports that her orthopedic MD wants her to have surgery, but she refuses b/c of her stents.  She does not know the doctor's name, but states that he refuses to give her more pain meds.  She states "nobody will give me pain medicine because of this one doctor.  Even the emergency room doctors won't help me, they just walk out of the room.  My primary care doctor won't help me.  Please help me, Mandi." When I asked what I can do to help he, she states "Get Dr. Mariah Milling to get me some pain medicine." Explained to pt that Dr. Mariah Milling cannot give her pain medication and she should speak with her PCP about a possible referral to the pain clinic.   Pt verbalizes understanding and is agreeable to this.

## 2013-05-17 NOTE — Telephone Encounter (Signed)
Notified patient script ready for pick up and that she needs to Dr. Claris Gladden  Patient refuses stating they will not help her. Referred with MD was advised to call for notes. Called for notes as requested. Reception stated they are to see patient again in November, and that patient has had an MRI on shoulder, they will forward notes.

## 2013-05-17 NOTE — Telephone Encounter (Signed)
Pt asking if she can be referred to Pain Clinic, not Dr. Metta Clines.  Asking if it is okay if she gets the shots?  States the shots will relieve her pain.  Please advise pt.

## 2013-05-17 NOTE — Telephone Encounter (Signed)
I will refill the hydrocodone for 30 days only,  She needs to go back to ramasunder for the steroid injection or accept a referral to the pain clinic.  Which is she willing to do?

## 2013-05-17 NOTE — Telephone Encounter (Signed)
No I do not inject shoulders .  She has an appt with Dr Elvera Lennox in nOvember  She needs to keep that

## 2013-05-17 NOTE — Telephone Encounter (Signed)
Patient ask if we could give shot in shoulder joint?

## 2013-05-17 NOTE — Telephone Encounter (Signed)
Patient returned call today and ask if would give refill for Hydrocodone that Dr Lennox Grumbles says she has frozen shoulder but will not prescribe pain med unless patient has surgery. Please advise

## 2013-05-18 ENCOUNTER — Encounter: Payer: Self-pay | Admitting: Internal Medicine

## 2013-05-18 ENCOUNTER — Ambulatory Visit (INDEPENDENT_AMBULATORY_CARE_PROVIDER_SITE_OTHER): Payer: Medicare Other | Admitting: Internal Medicine

## 2013-05-18 VITALS — BP 140/88 | HR 82 | Temp 98.3°F | Resp 12 | Ht 67.0 in | Wt 157.8 lb

## 2013-05-18 DIAGNOSIS — E1159 Type 2 diabetes mellitus with other circulatory complications: Secondary | ICD-10-CM | POA: Diagnosis not present

## 2013-05-18 DIAGNOSIS — I739 Peripheral vascular disease, unspecified: Secondary | ICD-10-CM | POA: Diagnosis not present

## 2013-05-18 DIAGNOSIS — E1151 Type 2 diabetes mellitus with diabetic peripheral angiopathy without gangrene: Secondary | ICD-10-CM

## 2013-05-18 MED ORDER — GLIPIZIDE 10 MG PO TABS: ORAL_TABLET | ORAL | Status: DC

## 2013-05-18 MED ORDER — GLUCOSE BLOOD VI STRP: ORAL_STRIP | Status: DC

## 2013-05-18 MED ORDER — "INSULIN SYRINGE-NEEDLE U-100 30G X 1/2"" 0.5 ML MISC": Status: DC

## 2013-05-18 MED ORDER — METFORMIN HCL 500 MG PO TABS: 500.0000 mg | ORAL_TABLET | Freq: Two times a day (BID) | ORAL | Status: DC

## 2013-05-18 MED ORDER — SITAGLIPTIN PHOSPHATE 100 MG PO TABS: 100.0000 mg | ORAL_TABLET | Freq: Every day | ORAL | Status: DC

## 2013-05-18 MED ORDER — INSULIN NPH (HUMAN) (ISOPHANE) 100 UNIT/ML ~~LOC~~ SUSP: 30.0000 [IU] | Freq: Two times a day (BID) | SUBCUTANEOUS | Status: DC

## 2013-05-18 NOTE — Patient Instructions (Addendum)
Please return in 1 month with your sugar log.  Continue: - NPH insulin 30 units 2x a day (please inject the second dose at bedtime) - Glipizide 10 mg 2x a day - Januvia 100 mg in am - Please start Metformin 500 mg with dinner x 3 days. If you tolerate this well, add another Metformin tablet (500 mg) with breakfast. Continue with 500 mg of metformin twice a day with breakfast and dinner. Please call me with sugars <80 and >200. We will call in your refills to [facility].  PATIENT INSTRUCTIONS FOR TYPE 2 DIABETES:  DIET AND EXERCISE Diet and exercise is an important part of diabetic treatment.  We recommended aerobic exercise in the form of brisk walking (working between 40-60% of maximal aerobic capacity, similar to brisk walking) for 150 minutes per week (such as 30 minutes five days per week) along with 3 times per week performing 'resistance' training (using various gauge rubber tubes with handles) 5-10 exercises involving the major muscle groups (upper body, lower body and core) performing 10-15 repetitions (or near fatigue) each exercise. Start at half the above goal but build slowly to reach the above goals. If limited by weight, joint pain, or disability, we recommend daily walking in a swimming pool with water up to waist to reduce pressure from joints while allow for adequate exercise.    BLOOD GLUCOSES Monitoring your blood glucoses is important for continued management of your diabetes. Please check your blood glucoses 2-4 times a day: fasting, before meals and at bedtime (you can rotate these measurements - e.g. one day check before the 3 meals, the next day check before 2 of the meals and before bedtime, etc.   HYPOGLYCEMIA (low blood sugar) Hypoglycemia is usually a reaction to not eating, exercising, or taking too much insulin/ other diabetes drugs.  Symptoms include tremors, sweating, hunger, confusion, headache, etc. Treat IMMEDIATELY with 15 grams of Carbs:   4 glucose tablets     cup regular juice/soda   2 tablespoons raisins   4 teaspoons sugar   1 tablespoon honey Recheck blood glucose in 15 mins and repeat above if still symptomatic/blood glucose <100. Please contact our office at [phone] if you have questions about how to next handle your insulin.  RECOMMENDATIONS TO REDUCE YOUR RISK OF DIABETIC COMPLICATIONS: * Take your prescribed MEDICATION(S). * Follow a DIABETIC diet: Complex carbs, fiber rich foods, heart healthy fish twice weekly, (monounsaturated and polyunsaturated) fats * AVOID saturated/trans fats, high fat foods, >2,300 mg salt per day. * EXERCISE at least 5 times a week for 30 minutes or preferably daily.  * DO NOT SMOKE OR DRINK more than 1 drink a day. * Check your FEET every day. Do not wear tightfitting shoes. Contact us if you develop an ulcer * See your EYE doctor once a year or more if needed * Get a FLU shot once a year * Get a PNEUMONIA vaccine once before and once after age 33 years  GOALS:  * Your Hemoglobin A1c of <7%  * fasting sugars need to be <130 * after meals sugars need to be <180 (2h after you start eating) * Your Systolic BP should be 140 or lower  * Your Diastolic BP should be 80 or lower  * Your HDL (Good Cholesterol) should be 40 or higher  * Your LDL (Bad Cholesterol) should be 100 or lower  * Your Triglycerides should be 150 or lower  * Your Urine microalbumin (kidney function) should be <30 *  Your Body Mass Index should be 25 or lower   We will be glad to help you achieve these goals. Our telephone number is: [phone].

## 2013-05-18 NOTE — Progress Notes (Signed)
Patient ID: Sarah Payne, female   DOB: Oct 10, 1945, 67 y.o.   MRN: [id number]  HPI: Sarah Payne is a 67 y.o.-year-old female, referred by her PCP, Dr. Darrick Huntsman, for management of DM2, insulin-dependent, uncontrolled, with complications (CAD, sCHF, PAD, CKD, PN) and also complicated by dementia and medication noncompliance.   Patient has been diagnosed with diabetes in 2003; she started insulin 2013. Last hemoglobin A1c was: Lab Results  Component Value Date   HGBA1C 12.6* 02/25/2013   HGBA1C 9.9* 10/06/2012   HGBA1C 14.2* 06/30/2012   Pt is on a regimen of: - NPH 30 units bid (second dose after dinner) - Glipizide 10 mg bid - Januvia 100 mg daily She was started on Levemir pen ~ 6 mo ago.  Pt checks her sugars 3-4x a day and they are: - am: 145-288 - before lunch: 89-212 - before dinner: 64-219 - bedtime: 91-155 No lows. Lowest sugar was 64 x 1; she has ? hypoglycemia awareness. Highest sugar was 288.  Pt's meals are: - Breakfast: skips; if eats: an egg sandwich or sausage biscuit - Lunch: sandwich - Dinner: meat + veggies + starch - Snacks: she "nibbles" - at night, pork rinds, rarely sweets Drinks 3-4 sweet sodas + tea/day.   - + CKD, last BUN/creatinine:  Lab Results  Component Value Date   BUN 13 02/25/2013   CREATININE 1.2 02/25/2013  She is on Enalapril. - last set of lipids: Lab Results  Component Value Date   CHOL 212* 02/25/2013   HDL 37.40* 02/25/2013   LDLCALC 70 04/30/2011   LDLDIRECT 111.0 02/25/2013   TRIG 572.0* 02/25/2013   CHOLHDL 6 02/25/2013  She is not on a statin >> now off ? She is on ASA 81. - last eye exam was in 6 mo ago. No DR.  - + numbness and tingling in her feet.  Pt has FH of DM in mother, 5 other siblings.  ROS: Constitutional: + weight gain, + fatigue, + hot flushes, + poor sleep, + excessive urination Eyes: + blurry vision, no xerophthalmia ENT: no sore throat, no nodules palpated in throat, no dysphagia/odynophagia, no  hoarseness Cardiovascular: no CP/SOB/palpitations/leg swelling Respiratory: no cough/SOB Gastrointestinal: + N/+ V/no D/C Musculoskeletal: + muscle/+ joint aches Skin: no rashes, + easy bruising and itching Neurological: no tremors/numbness/tingling/dizziness Psychiatric: no depression/+ anxiety Low libido  Past Medical History  Diagnosis Date  . Diabetes mellitus   . Hypertension   . Hypothyroidism   . Depression   . Vascular dementia   . Tobacco abuse     a. 45 pack year history - quit 09/16/11  . Chronic low back pain   . Coronary artery disease     a. s/p PCI/DES LCX x 3;  b. NSTEMI 09/16/11;  c.   09/17/11 Cath [facility]) Severe RCA/LCX dzs - RCA treated, LCX intervention pending w/ Dr. Juliann Pares   Past Surgical History  Procedure Laterality Date  . Back surgery    . Abdominal hysterectomy    . Appendectomy    . Cardiac catheterization  2013  . Coronary angioplasty  2012    stent x 3   . Coronary angioplasty with stent placement  2013   History   Social History  . Marital Status: Married    Spouse Name: N/A    Number of Children: 3   Occupational History  . retired   Social History Main Topics  . Smoking status: Current Every Day Smoker -- 0.25 packs/day    Types: Cigarettes  .  Smokeless tobacco: Never Used     Comment: Has cut back, trying to quit. Using the E-cigarette.  . Alcohol Use: No  . Drug Use: No   Social History Narrative   Lives at home with her husband in [place].  Previously used marijuana - quit.      Regular exercise: no/ pain from a frozen rotator cuff   Caffeine use: coffee daily and pepsi   Current Outpatient Prescriptions on File Prior to Visit  Medication Sig Dispense Refill  . albuterol (PROVENTIL HFA;VENTOLIN HFA) 108 (90 BASE) MCG/ACT inhaler Inhale 2 puffs into the lungs every 6 (six) hours as needed for wheezing.  1 Inhaler  11  . albuterol (PROVENTIL) (2.5 MG/3ML) 0.083% nebulizer solution Take 3 mLs (2.5 mg total) by nebulization  every 6 (six) hours as needed for wheezing.  75 mL  12  . aspirin 81 MG tablet Take 81 mg by mouth daily.        . Calcium & Magnesium Carbonates (MYLANTA PO) Take by mouth.        . carvedilol (COREG) 6.25 MG tablet Take 1 tablet (6.25 mg total) by mouth 2 (two) times daily.  180 tablet  3  . clobetasol ointment (TEMOVATE) 0.05 % APPLY EVERY NIGHT TO VAGINAL FOLDS  30 g  0  . clopidogrel (PLAVIX) 75 MG tablet TAKE 1 TABLET (75 MG TOTAL) BY MOUTH DAILY.  90 tablet  3  . cyanocobalamin (,VITAMIN B-12,) 1000 MCG/ML injection INJECT 1 ML (1,000 MCG TOTAL) INTO THE MUSCLE ONCE.  1 mL  3  . donepezil (ARICEPT) 5 MG tablet Take 1 tablet (5 mg total) by mouth at bedtime.  90 tablet  3  . DULoxetine (CYMBALTA) 60 MG capsule TAKE 1 CAPSULE BY MOUTH DAILY.  30 capsule  2  . enalapril (VASOTEC) 10 MG tablet Take 1 tablet (10 mg total) by mouth daily.  90 tablet  1  . fluticasone (FLOVENT HFA) 220 MCG/ACT inhaler Inhale 1 puff into the lungs 2 (two) times daily.  1 Inhaler  12  . gabapentin (NEURONTIN) 100 MG capsule Take 1 capsule (100 mg total) by mouth 3 (three) times daily.  90 capsule  3  . glucose monitoring kit (FREESTYLE) monitoring kit 1 each by Does not apply route as needed for other. PHARMACY PLEASE SUBSTITUTE WHATEVER GLUCOMETER YOU CARRY  1 each  1  . HYDROcodone-acetaminophen (NORCO/VICODIN) 5-325 MG per tablet Take 1 tablet by mouth every 6 (six) hours as needed for pain.  90 tablet  0  . isosorbide mononitrate (IMDUR) 30 MG 24 hr tablet Take 1 tablet (30 mg total) by mouth daily.  90 tablet  3  . Levothyroxine Sodium 137 MCG CAPS Take 1 capsule (137 mcg total) by mouth daily before breakfast.  30 capsule  2  . magnesium oxide (MAG-OX) 400 MG tablet Take 400 mg by mouth daily.        . nitroGLYCERIN (NITROSTAT) 0.4 MG SL tablet Place 1 tablet (0.4 mg total) under the tongue every 5 (five) minutes as needed.  30 tablet  1  . ondansetron (ZOFRAN-ODT) 8 MG disintegrating tablet Take 1 tablet (8 mg  total) by mouth every 8 (eight) hours as needed for nausea.  20 tablet  0  . oxyCODONE-acetaminophen (PERCOCET/ROXICET) 5-325 MG per tablet Take 1 tablet by mouth every 6 (six) hours as needed for pain.  30 tablet  0  . tiotropium (SPIRIVA HANDIHALER) 18 MCG inhalation capsule Place 1 capsule (18 mcg total) into  inhaler and inhale daily.  30 capsule  2  . traZODone (DESYREL) 100 MG tablet TAKE 1 TABLET (100 MG TOTAL) BY MOUTH AT BEDTIME.  90 tablet  2   No current facility-administered medications on file prior to visit.   No Active Allergies Family History  Problem Relation Age of Onset  . Heart attack Mother     First MI @ 47 - Died @ 18  . Heart disease Mother   . Heart disease Father     Died @ 17  . Throat cancer Brother    PE: BP 140/88  Pulse 82  Temp(Src) 98.3 F (36.8 C) (Oral)  Resp 12  Ht 5\' 7"  (1.702 m)  Wt 157 lb 12.8 oz (71.578 kg)  BMI 24.71 kg/m2  SpO2 97% Wt Readings from Last 3 Encounters:  05/18/13 157 lb 12.8 oz (71.578 kg)  04/27/13 161 lb (73.029 kg)  04/11/13 161 lb (73.029 kg)   Constitutional: normal weight, in NAD Eyes: PERRLA, EOMI, no exophthalmos ENT: moist mucous membranes, no thyromegaly, no cervical lymphadenopathy Cardiovascular: RRR, No MRG Respiratory: CTA B Gastrointestinal: abdomen soft, NT, ND, BS+ Musculoskeletal: no deformities, strength intact in all 4 Skin: moist, warm, no rashes Neurological: no tremor with outstretched hands, DTR normal in all 4  ASSESSMENT: 1. DM2, insulin-dependent, uncontrolled, with complications - med noncompliance (dementia) - CAD - had 3 AMI in 2011 >> 7 stents placed - sCHF - PAD - CKD - PN  PLAN:  1. Patient with long-standing, recently more uncontrolled diabetes, on oral + insulin antidiabetic regimen, with poor med compliance. NPH 30 units bid was added after last HbA1C back 2 mo ago (before she was on 30 units Levemir daily)>> sugars improved per review of her log compared to previous HbA1C.  She tells me she does not miss her meds. - We discussed about options for treatment, and I suggested to:  Patient Instructions  Please return in 1 month with your sugar log.  Continue: - NPH insulin 30 units 2x a day (please inject the second dose at bedtime) - Glipizide 10 mg 2x a day - Januvia 100 mg in am Please start Metformin 500 mg with dinner x 3 days. If you tolerate this well, add another Metformin tablet (500 mg) with breakfast. Continue with 500 mg of metformin twice a day with breakfast and dinner. Please call me with sugars <80 and >200. We will call in your refills to [facility]. - Strongly advised her to start checking sugars at different times of the day - check 2-3 times a day, rotating checks - given sugar log and advised how to fill it and to bring it at next appt  - given foot care handout and explained the principles  - given instructions for hypoglycemia management "15-15 rule"  - Advised to reduce soda intake - advised for yearly eye exams > She is up to date. - Return to clinic in 1 mo with sugar log

## 2013-05-19 ENCOUNTER — Encounter: Payer: Self-pay | Admitting: Internal Medicine

## 2013-05-19 DIAGNOSIS — M25519 Pain in unspecified shoulder: Secondary | ICD-10-CM | POA: Diagnosis not present

## 2013-05-19 MED ORDER — ETODOLAC 400 MG PO TABS: 400.0000 mg | ORAL_TABLET | Freq: Two times a day (BID) | ORAL | Status: DC

## 2013-05-19 NOTE — Telephone Encounter (Signed)
Patient did receive MRI as noted.

## 2013-05-19 NOTE — Telephone Encounter (Signed)
Correction:  On october 24th Dr Claris Gladden ordered specific PT based on the results of Sarah Payne's MRI, 9which  I now have along with her notes) so whatever instructions she wrote out for her therapist m she needs to take that to her first PT meeting  She also watns her to take an anti inflammatory called etodolac , I will send rx to pharmacy.  She needs to follow up with Dr. Elvera Lennox after 4 weeks of PT

## 2013-05-19 NOTE — Telephone Encounter (Signed)
Notified patient as instructed patient. Patient stated she would call Dr. Elvera Lennox office to find the location of PT.

## 2013-05-19 NOTE — Telephone Encounter (Signed)
PT ordered.  Did she get the MRI that Dr Claris Gladden ordered?

## 2013-05-19 NOTE — Telephone Encounter (Signed)
Patient returned call and notified patient of message and patient ask if she could be set up to have physical therapy patient stated Dr. Elvera Lennox stated to primary for referral to physical therapy.

## 2013-05-20 DIAGNOSIS — M25519 Pain in unspecified shoulder: Secondary | ICD-10-CM | POA: Diagnosis not present

## 2013-05-21 DIAGNOSIS — M25519 Pain in unspecified shoulder: Secondary | ICD-10-CM | POA: Diagnosis not present

## 2013-05-24 ENCOUNTER — Ambulatory Visit: Payer: Medicare Other | Admitting: Adult Health

## 2013-05-30 ENCOUNTER — Encounter: Payer: Self-pay | Admitting: Internal Medicine

## 2013-05-30 ENCOUNTER — Ambulatory Visit (INDEPENDENT_AMBULATORY_CARE_PROVIDER_SITE_OTHER): Payer: Medicare Other | Admitting: Internal Medicine

## 2013-05-30 VITALS — BP 128/80 | HR 79 | Temp 97.7°F | Resp 12 | Wt 159.2 lb

## 2013-05-30 DIAGNOSIS — E1151 Type 2 diabetes mellitus with diabetic peripheral angiopathy without gangrene: Secondary | ICD-10-CM

## 2013-05-30 DIAGNOSIS — G894 Chronic pain syndrome: Secondary | ICD-10-CM

## 2013-05-30 DIAGNOSIS — M19019 Primary osteoarthritis, unspecified shoulder: Secondary | ICD-10-CM | POA: Diagnosis not present

## 2013-05-30 DIAGNOSIS — M12812 Other specific arthropathies, not elsewhere classified, left shoulder: Secondary | ICD-10-CM

## 2013-05-30 DIAGNOSIS — M25529 Pain in unspecified elbow: Secondary | ICD-10-CM | POA: Diagnosis not present

## 2013-05-30 DIAGNOSIS — M25522 Pain in left elbow: Secondary | ICD-10-CM

## 2013-05-30 DIAGNOSIS — I739 Peripheral vascular disease, unspecified: Secondary | ICD-10-CM

## 2013-05-30 DIAGNOSIS — E1159 Type 2 diabetes mellitus with other circulatory complications: Secondary | ICD-10-CM | POA: Diagnosis not present

## 2013-05-30 LAB — HM DIABETES FOOT EXAM: HM Diabetic Foot Exam: ABNORMAL

## 2013-05-30 MED ORDER — HYDROCODONE-ACETAMINOPHEN 5-325 MG PO TABS: 1.0000 | ORAL_TABLET | Freq: Four times a day (QID) | ORAL | Status: DC | PRN

## 2013-05-30 MED ORDER — HYDROCODONE-ACETAMINOPHEN 10-325 MG PO TABS: 1.0000 | ORAL_TABLET | Freq: Four times a day (QID) | ORAL | Status: DC | PRN

## 2013-05-30 NOTE — Patient Instructions (Addendum)
Please suspend the metformin for a week to see if the nausea resolves.  Do not omit the insulin dose because it is  slow acting and will take effect hours later.  You can reduce the insulin dose to 15 units if your blood sugar is < 120.  I will refer you to [facility] for a second opinion on your shoulder  You will need to take the MRI images with you on a disk that that the Hospital's Radiology dept  Will give you.  Take this to your appt with the new orthopedist   I have changed your pain pill to a higher strength,  You should not take more than 4 daily (1 every 6 hours )

## 2013-05-30 NOTE — Progress Notes (Signed)
Pre-visit discussion using our clinic review tool. No additional management support is needed unless otherwise documented below in the visit note.  

## 2013-05-30 NOTE — Progress Notes (Signed)
Patient ID: Sarah Payne, female   DOB: May 16, 1946, 66 y.o.   MRN: [id number]   Patient Active Problem List   Diagnosis Date Noted  . Pain in joint, upper arm 04/12/2013  . Myalgia 04/06/2013  . COPD exacerbation 03/29/2013  . COPD, moderate 03/29/2013  . Sweating 03/24/2013  . Malaise 03/24/2013  . Noncompliance with diabetes treatment 02/27/2013  . Unspecified gastritis and gastroduodenitis without mention of hemorrhage 02/27/2013  . Obstructive sleep apnea 02/25/2013  . Leg weakness, bilateral 10/07/2012  . Other malaise and fatigue 10/06/2012  . Chronic systolic congestive heart failure 05/21/2012  . Lichen sclerosus et atrophicus 05/20/2012  . Bronchitis, chronic obstructive 05/20/2012  . Headache 10/15/2011  . Angina at rest 09/25/2011  . Diabetes mellitus type 2 with peripheral artery disease 08/28/2011  . History of lumbar discectomy 06/29/2011  . Depression 06/24/2011  . Hypothyroidism 05/03/2011  . CAD, multiple vessel 02/24/2011  . Tobacco abuse 02/24/2011  . Hyperlipidemia 02/24/2011    Subjective:  CC:   Chief Complaint  Patient presents with  . Follow-up    pain bilateral shoulder and to the top of both arms.    HPI:   Sarah Payne a 67 y.o. female who presents Follow up on chronic issues including uncontrolled DM.  Has beenreferred to Dr. Lafe Garin due to persistent noncompliance resulting in uncontrolled DM.  Was  started on NPH 30 units bid and metformin by Dr Lafe Garin (first OV 10/29) and has suspended the NPH shots at times due to blood sugar being  In normal range.  Taking metformin, glipizide and Januvia.  Has been having a lot of emesis in the morning.   Shoulder pain:  MRI ordered by Ramashunder showed infraspinatal tear, tendinosis and degenerative changes.  Has been advised to have surgery , which she declined becaseu of her extensive CAD ,  But reqeusting articular injection which Dr Claris Gladden has not done, for unclear reasons.,  Patient tearful  today,  In constant pain which is limiting her daily activities.    Past Medical History  Diagnosis Date  . Diabetes mellitus   . Hypertension   . Hypothyroidism   . Depression   . Vascular dementia   . Tobacco abuse     a. 45 pack year history - quit 09/16/11  . Chronic low back pain   . Coronary artery disease     a. s/p PCI/DES LCX x 3;  b. NSTEMI 09/16/11;  c.   09/17/11 Cath [facility]) Severe RCA/LCX dzs - RCA treated, LCX intervention pending w/ Dr. Juliann Pares    Past Surgical History  Procedure Laterality Date  . Back surgery    . Abdominal hysterectomy    . Appendectomy    . Cardiac catheterization  2013  . Coronary angioplasty  2012    stent x 3   . Coronary angioplasty with stent placement  2013       The following portions of the patient's history were reviewed and updated as appropriate: Allergies, current medications, and problem list.    Review of Systems:   12 Pt  review of systems was negative except those addressed in the HPI,     History   Social History  . Marital Status: Married    Spouse Name: N/A    Number of Children: N/A  . Years of Education: N/A   Occupational History  . Not on file.   Social History Main Topics  . Smoking status: Current Every Day Smoker -- 0.25 packs/day  Types: Cigarettes  . Smokeless tobacco: Never Used     Comment: Has cut back, trying to quit. Using the E-cigarette.  . Alcohol Use: No  . Drug Use: No  . Sexual Activity: Not on file   Other Topics Concern  . Not on file   Social History Narrative   Lives at home with her husband in [place].  Previously used marijuana - quit.      Regular exercise: no/ pain from a frozen rotator cuff   Caffeine use: coffee daily and pepsi    Objective:  Filed Vitals:   05/30/13 1603  BP: 128/80  Pulse: 79  Temp: 97.7 F (36.5 C)  Resp: 12     General appearance: alert, cooperative and appears stated age Ears: normal TM's and external ear canals both  ears Throat: lips, mucosa, and tongue normal; teeth and gums normal Neck: no adenopathy, no carotid bruit, supple, symmetrical, trachea midline and thyroid not enlarged, symmetric, no tenderness/mass/nodules Back: symmetric, no curvature. ROM normal. No CVA tenderness. Lungs: clear to auscultation bilaterally Heart: regular rate and rhythm, S1, S2 normal, no murmur, click, rub or gallop Abdomen: soft, non-tender; bowel sounds normal; no masses,  no organomegaly Pulses: 2+ and symmetric Skin: Skin color, texture, turgor normal. No rashes or lesions Lymph nodes: Cervical, supraclavicular, and axillary nodes normal.  Assessment and Plan:  Diabetes mellitus type 2 with peripheral artery disease Follow up with Dr Lafe Garin as advised.,  Explained to patient that the NPH should be continued bid but dose could be reduced to 15 units for normoglycemic readings. Foot exam was done today.  a1c is due  Pain in joint, upper arm MRI showing rotator cuff tendinosis with partial infraspinatus tear.  2nd orthopedic evaluation advised for treatment.  She is using 6 Vicodin daily. With incomplete relief of pain .  Percocet Rx given Referral to [facility]  A total of 40 minutes was spent with patient more than half of which was spent in counseling, reviewing records from other prviders and coordination of care.  Updated Medication List Outpatient Encounter Prescriptions as of 05/30/2013  Medication Sig  . albuterol (PROVENTIL) (2.5 MG/3ML) 0.083% nebulizer solution Take 3 mLs (2.5 mg total) by nebulization every 6 (six) hours as needed for wheezing.  Marland Kitchen aspirin 81 MG tablet Take 81 mg by mouth daily.    . Calcium & Magnesium Carbonates (MYLANTA PO) Take by mouth.    . carvedilol (COREG) 6.25 MG tablet Take 1 tablet (6.25 mg total) by mouth 2 (two) times daily.  . clobetasol ointment (TEMOVATE) 0.05 % APPLY EVERY NIGHT TO VAGINAL FOLDS  . clopidogrel (PLAVIX) 75 MG tablet TAKE 1 TABLET (75 MG TOTAL) BY  MOUTH DAILY.  . cyanocobalamin (,VITAMIN B-12,) 1000 MCG/ML injection INJECT 1 ML (1,000 MCG TOTAL) INTO THE MUSCLE ONCE.  Marland Kitchen donepezil (ARICEPT) 5 MG tablet Take 1 tablet (5 mg total) by mouth at bedtime.  . DULoxetine (CYMBALTA) 60 MG capsule TAKE 1 CAPSULE BY MOUTH DAILY.  Marland Kitchen enalapril (VASOTEC) 10 MG tablet Take 1 tablet (10 mg total) by mouth daily.  . fluticasone (FLOVENT HFA) 220 MCG/ACT inhaler Inhale 1 puff into the lungs 2 (two) times daily.  Marland Kitchen gabapentin (NEURONTIN) 100 MG capsule Take 1 capsule (100 mg total) by mouth 3 (three) times daily.  Marland Kitchen glipiZIDE (GLUCOTROL) 10 MG tablet Before breakfast and supper (evening meal)  . glucose blood (FREESTYLE LITE) test strip Check blood sugar 3 times daily to check blood sugars - Uncontrolled diabetes mellitus  .  glucose monitoring kit (FREESTYLE) monitoring kit 1 each by Does not apply route as needed for other. PHARMACY PLEASE SUBSTITUTE WHATEVER GLUCOMETER YOU CARRY  . insulin NPH (HUMULIN N,NOVOLIN N) 100 UNIT/ML injection Inject 30 Units into the skin 2 (two) times daily at 8 am and 10 pm.  . Insulin Syringe-Needle U-100 (B-D INS SYR ULTRAFINE .5CC/30G) 30G X 1/2" 0.5 ML MISC Inject 2x a day  . isosorbide mononitrate (IMDUR) 30 MG 24 hr tablet Take 1 tablet (30 mg total) by mouth daily.  . Levothyroxine Sodium 137 MCG CAPS Take 1 capsule (137 mcg total) by mouth daily before breakfast.  . magnesium oxide (MAG-OX) 400 MG tablet Take 400 mg by mouth daily.    . metFORMIN (GLUCOPHAGE) 500 MG tablet Take 1 tablet (500 mg total) by mouth 2 (two) times daily with a meal.  . nitroGLYCERIN (NITROSTAT) 0.4 MG SL tablet Place 1 tablet (0.4 mg total) under the tongue every 5 (five) minutes as needed.  . ondansetron (ZOFRAN-ODT) 8 MG disintegrating tablet Take 1 tablet (8 mg total) by mouth every 8 (eight) hours as needed for nausea.  . sitaGLIPtin (JANUVIA) 100 MG tablet Take 1 tablet (100 mg total) by mouth daily.  Marland Kitchen tiotropium (SPIRIVA HANDIHALER) 18  MCG inhalation capsule Place 1 capsule (18 mcg total) into inhaler and inhale daily.  . traZODone (DESYREL) 100 MG tablet TAKE 1 TABLET (100 MG TOTAL) BY MOUTH AT BEDTIME.  . [DISCONTINUED] HYDROcodone-acetaminophen (NORCO/VICODIN) 5-325 MG per tablet Take 1 tablet by mouth every 6 (six) hours as needed for pain.  . [DISCONTINUED] HYDROcodone-acetaminophen (NORCO/VICODIN) 5-325 MG per tablet Take 1 tablet by mouth every 6 (six) hours as needed.  . etodolac (LODINE) 400 MG tablet Take 1 tablet (400 mg total) by mouth 2 (two) times daily.  Marland Kitchen HYDROcodone-acetaminophen (NORCO) 10-325 MG per tablet Take 1 tablet by mouth every 6 (six) hours as needed. MAXIMUM 4 TABLETS DAILY  . [DISCONTINUED] albuterol (PROVENTIL HFA;VENTOLIN HFA) 108 (90 BASE) MCG/ACT inhaler Inhale 2 puffs into the lungs every 6 (six) hours as needed for wheezing.  . [DISCONTINUED] oxyCODONE-acetaminophen (PERCOCET/ROXICET) 5-325 MG per tablet Take 1 tablet by mouth every 6 (six) hours as needed for pain.     Orders Placed This Encounter  Procedures  . Microalbumin / creatinine urine ratio  . Hemoglobin A1c  . Comp Met (CMET)  . Drug Screen, Urine  . AMB referral to orthopedics  . HM DIABETES EYE EXAM  . HM DIABETES FOOT EXAM    No Follow-up on file.

## 2013-05-31 DIAGNOSIS — Z79899 Other long term (current) drug therapy: Secondary | ICD-10-CM | POA: Diagnosis not present

## 2013-05-31 LAB — COMPREHENSIVE METABOLIC PANEL
ALT: 14 U/L (ref 0–35)
AST: 19 U/L (ref 0–37)
Albumin: 4.1 g/dL (ref 3.5–5.2)
Alkaline Phosphatase: 62 U/L (ref 39–117)
BUN: 12 mg/dL (ref 6–23)
CO2: 30 mEq/L (ref 19–32)
Calcium: 8.8 mg/dL (ref 8.4–10.5)
Chloride: 102 mEq/L (ref 96–112)
Creatinine, Ser: 1 mg/dL (ref 0.4–1.2)
GFR: 60.49 mL/min (ref >60.00)
Glucose, Bld: 107 mg/dL — ABNORMAL HIGH (ref 70–99)
Potassium: 3.5 mEq/L (ref 3.5–5.1)
Sodium: 137 mEq/L (ref 135–145)
Total Bilirubin: 0.6 mg/dL (ref 0.3–1.2)
Total Protein: 6.9 g/dL (ref 6.0–8.3)

## 2013-05-31 LAB — DRUG SCREEN, URINE
Amphetamine Screen, Ur: POSITIVE — AB
Barbiturate Quant, Ur: NEGATIVE
Benzodiazepines.: NEGATIVE
Cocaine Metabolites: NEGATIVE
Creatinine,U: 117.23 mg/dL
Marijuana Metabolite: POSITIVE — AB
Methadone: NEGATIVE
Opiates: POSITIVE — AB
Phencyclidine (PCP): NEGATIVE
Propoxyphene: NEGATIVE

## 2013-05-31 LAB — MICROALBUMIN / CREATININE URINE RATIO
Creatinine,U: 115.1 mg/dL
Microalb Creat Ratio: 1 mg/g (ref 0.0–30.0)
Microalb, Ur: 1.2 mg/dL (ref 0.0–1.9)

## 2013-05-31 LAB — HEMOGLOBIN A1C: Hgb A1c MFr Bld: 10 % — ABNORMAL HIGH (ref 4.6–6.5)

## 2013-05-31 NOTE — Assessment & Plan Note (Addendum)
Follow up with Dr Lafe Garin as advised.,  Explained to patient that the NPH should be continued bid but dose could be reduced to 15 units for normoglycemic readings. Foot exam was done today.  a1c is due

## 2013-05-31 NOTE — Assessment & Plan Note (Signed)
MRI showing rotator cuff tendinosis with partial infraspinatus tear.  2nd orthopedic evaluation advised for treatment.  She is using 6 Vicodin daily. With incomplete relief of pain .  Percocet Rx given Referral to [facility]

## 2013-06-01 DIAGNOSIS — M25519 Pain in unspecified shoulder: Secondary | ICD-10-CM | POA: Diagnosis not present

## 2013-06-07 DIAGNOSIS — M7500 Adhesive capsulitis of unspecified shoulder: Secondary | ICD-10-CM | POA: Diagnosis not present

## 2013-06-10 ENCOUNTER — Encounter: Payer: Self-pay | Admitting: Emergency Medicine

## 2013-06-15 ENCOUNTER — Encounter: Payer: Self-pay | Admitting: Internal Medicine

## 2013-06-15 ENCOUNTER — Ambulatory Visit (INDEPENDENT_AMBULATORY_CARE_PROVIDER_SITE_OTHER): Payer: Medicare Other | Admitting: Internal Medicine

## 2013-06-15 VITALS — BP 122/80 | HR 104 | Temp 98.0°F | Resp 12 | Wt 157.0 lb

## 2013-06-15 DIAGNOSIS — I739 Peripheral vascular disease, unspecified: Secondary | ICD-10-CM

## 2013-06-15 DIAGNOSIS — E1151 Type 2 diabetes mellitus with diabetic peripheral angiopathy without gangrene: Secondary | ICD-10-CM

## 2013-06-15 DIAGNOSIS — E1159 Type 2 diabetes mellitus with other circulatory complications: Secondary | ICD-10-CM

## 2013-06-15 MED ORDER — INSULIN NPH (HUMAN) (ISOPHANE) 100 UNIT/ML ~~LOC~~ SUSP: 20.0000 [IU] | Freq: Two times a day (BID) | SUBCUTANEOUS | Status: DC

## 2013-06-15 NOTE — Progress Notes (Signed)
Patient ID: Sarah Payne, female   DOB: 1946/02/26, 67 y.o.   MRN: [id number]  HPI: Sarah Payne is a 67 y.o.-year-old female, returning for f/u for DM2, dx 2003, insulin-dependent since 2013, uncontrolled, with complications (CAD, sCHF, PAD, CKD, PN) and also complicated by dementia and medication noncompliance. Last visit 1 mo ago.  Last hemoglobin A1c was: Lab Results  Component Value Date   HGBA1C 10.0* 05/30/2013   HGBA1C 12.6* 02/25/2013   HGBA1C 9.9* 10/06/2012   Pt is on a regimen of: - NPH 30 units bid (second dose after dinner)  - take 1x a day >> sugars in the 200s when she does not take it - Glipizide 10 mg bid - Januvia 100 mg daily - I advised her to start Metformin at last visit She was started on Levemir pen ~ 7 mo ago.  Pt checks her sugars 3-4x a day and they are: - am: 145-288 >> 80-200 (max 286) - 2h after b'fast: 156-267 - before lunch: 89-212 >> 159-185 - 2h after lunch: 134-230 - before dinner: 64-219 >> 140-202 - 2h after dinner: 71, 305 - bedtime: 91-155 >> 119-170 No lows. Lowest sugar was 71 x 1; she has ? hypoglycemia awareness. Highest sugar was 305.  Pt's meals are: - Breakfast: skips; if eats: an egg sandwich or sausage biscuit - Lunch: sandwich - Dinner: meat + veggies + starch - Snacks: she "nibbles" - at night, pork rinds, rarely sweets Drinks 3-4 sweet sodas + tea/day.   She will have a DM class on 06/23/2013.   Pt describes getting nauseated in am.   - + CKD, last BUN/creatinine:  Lab Results  Component Value Date   BUN 12 05/30/2013   CREATININE 1.0 05/30/2013  She is on Enalapril. - last set of lipids: Lab Results  Component Value Date   CHOL 212* 02/25/2013   HDL 37.40* 02/25/2013   LDLCALC 70 04/30/2011   LDLDIRECT 111.0 02/25/2013   TRIG 572.0* 02/25/2013   CHOLHDL 6 02/25/2013  She is not on a statin. She is on ASA 81. - last eye exam was in 7 mo ago. No DR.  - + numbness and tingling in her feet.  I reviewed pt's medications,  allergies, PMH, social hx, family hx and no changes required, except as mentioned above.  ROS: Constitutional: no weight gain/loss, + fatigue, + chills, + excessive urination Eyes: + blurry vision, no xerophthalmia ENT: no sore throat, no nodules palpated in throat, no dysphagia/odynophagia, no hoarseness Cardiovascular: no CP/+SOB/no palpitations/leg swelling Respiratory: no cough/+ SOB Gastrointestinal: + N/+ V/no D/C Musculoskeletal: + muscle/+ joint aches Skin: no rashes, + easy bruising  Neurological: no tremors/numbness/tingling/dizziness Low libido  PE: BP 122/80  Pulse 104  Temp(Src) 98 F (36.7 C) (Oral)  Resp 12  Wt 157 lb (71.215 kg)  SpO2 97% Wt Readings from Last 3 Encounters:  06/15/13 157 lb (71.215 kg)  05/30/13 159 lb 4 oz (72.235 kg)  05/18/13 157 lb 12.8 oz (71.578 kg)   Constitutional: normal weight, in NAD, strong tobacco smell Eyes: PERRLA, EOMI, no exophthalmos ENT: moist mucous membranes, no thyromegaly, no cervical lymphadenopathy Cardiovascular: RRR, No MRG Respiratory: CTA B Gastrointestinal: abdomen soft, NT, ND, BS+ Musculoskeletal: no deformities, strength intact in all 4 Skin: moist, warm, no rashes, very dry skin Neurological: no tremor with outstretched hands, DTR normal in all 4  ASSESSMENT: 1. DM2, insulin-dependent, uncontrolled, with complications - med noncompliance (dementia) - CAD - had 3 AMI in 2011 >> 7  stents placed - sCHF - PAD - CKD - PN  PLAN:  1. Patient with long-standing uncontrolled diabetes, on oral + insulin antidiabetic regimen, with poor med compliance. She tells me she did not start Metformin yet (picked it up from the pharmacy last time), and she only took NPH once a day as her sugars occaionally dropped after taking the NPH.  - We discussed about options for treatment, and I suggested to:  Patient Instructions  Please return in 1.5 months with your sugar log.  Decrease NPH to 20 units 2x a day - take the  second dose at bedtime. Continue Glipizide. Continue Januvia (can try to stay off this for 1-2 weeks to see if nausea improves).  Please start Metformin 500 mg with dinner x 4 days. If you tolerate this well, add another Metformin tablet (500 mg) with breakfast x 4 days. If you tolerate this well, add another metformin tablet with dinner (total 1000 mg) x 4 days. If you tolerate this well, add another metformin tablets with breakfast (total 1000 mg). Continue with 1000 mg of metformin twice a day with breakfast and dinner. - continue checking sugars at different times of the day - check 2-3 times a day, rotating checks - she did a great job checking these and writing them down - She is up to date with eye exams - Return to clinic in 1 mo with sugar log

## 2013-06-15 NOTE — Patient Instructions (Signed)
Please return in 1.5 months with your sugar log.  Decrease NPH to 20 units 2x a day - take the second dose at bedtime. Continue Glipizide. Continue Januvia (can try to stay off this for 1-2 weeks to see if nausea improves).  Please start Metformin 500 mg with dinner x 4 days. If you tolerate this well, add another Metformin tablet (500 mg) with breakfast x 4 days. If you tolerate this well, add another metformin tablet with dinner (total 1000 mg) x 4 days. If you tolerate this well, add another metformin tablets with breakfast (total 1000 mg). Continue with 1000 mg of metformin twice a day with breakfast and dinner.

## 2013-06-21 ENCOUNTER — Encounter: Payer: Self-pay | Admitting: Internal Medicine

## 2013-07-12 DIAGNOSIS — M7500 Adhesive capsulitis of unspecified shoulder: Secondary | ICD-10-CM | POA: Diagnosis not present

## 2013-07-21 DIAGNOSIS — A498 Other bacterial infections of unspecified site: Secondary | ICD-10-CM

## 2013-07-21 HISTORY — DX: Other bacterial infections of unspecified site: A49.8

## 2013-07-27 ENCOUNTER — Ambulatory Visit: Payer: Medicare Other | Admitting: Internal Medicine

## 2013-07-27 ENCOUNTER — Encounter: Payer: Self-pay | Admitting: Internal Medicine

## 2013-07-27 ENCOUNTER — Telehealth: Payer: Self-pay | Admitting: *Deleted

## 2013-07-27 DIAGNOSIS — Z720 Tobacco use: Secondary | ICD-10-CM

## 2013-07-27 DIAGNOSIS — J449 Chronic obstructive pulmonary disease, unspecified: Secondary | ICD-10-CM

## 2013-07-27 DIAGNOSIS — E1151 Type 2 diabetes mellitus with diabetic peripheral angiopathy without gangrene: Secondary | ICD-10-CM

## 2013-07-27 MED ORDER — TRAZODONE HCL 100 MG PO TABS: ORAL_TABLET | ORAL | Status: DC

## 2013-07-27 MED ORDER — MAGNESIUM OXIDE 400 MG PO TABS: 400.0000 mg | ORAL_TABLET | Freq: Every day | ORAL | Status: DC

## 2013-07-27 MED ORDER — GABAPENTIN 100 MG PO CAPS: 100.0000 mg | ORAL_CAPSULE | Freq: Three times a day (TID) | ORAL | Status: DC

## 2013-07-27 MED ORDER — CARVEDILOL 6.25 MG PO TABS: 6.2500 mg | ORAL_TABLET | Freq: Two times a day (BID) | ORAL | Status: DC

## 2013-07-27 MED ORDER — ALBUTEROL SULFATE (2.5 MG/3ML) 0.083% IN NEBU: 2.5000 mg | INHALATION_SOLUTION | Freq: Four times a day (QID) | RESPIRATORY_TRACT | Status: DC | PRN

## 2013-07-27 MED ORDER — ENALAPRIL MALEATE 10 MG PO TABS: 10.0000 mg | ORAL_TABLET | Freq: Every day | ORAL | Status: DC

## 2013-07-27 MED ORDER — METFORMIN HCL 500 MG PO TABS: 500.0000 mg | ORAL_TABLET | Freq: Two times a day (BID) | ORAL | Status: DC

## 2013-07-27 MED ORDER — LEVOTHYROXINE SODIUM 137 MCG PO CAPS: 137.0000 ug | ORAL_CAPSULE | Freq: Every day | ORAL | Status: DC

## 2013-07-27 MED ORDER — CLOBETASOL PROPIONATE 0.05 % EX OINT: TOPICAL_OINTMENT | CUTANEOUS | Status: DC

## 2013-07-27 MED ORDER — CYANOCOBALAMIN 1000 MCG/ML IJ SOLN: INTRAMUSCULAR | Status: DC

## 2013-07-27 MED ORDER — FLUTICASONE PROPIONATE HFA 220 MCG/ACT IN AERO: 1.0000 | INHALATION_SPRAY | Freq: Two times a day (BID) | RESPIRATORY_TRACT | Status: DC

## 2013-07-27 MED ORDER — DULOXETINE HCL 60 MG PO CPEP: ORAL_CAPSULE | ORAL | Status: DC

## 2013-07-27 MED ORDER — ETODOLAC 400 MG PO TABS: 400.0000 mg | ORAL_TABLET | Freq: Two times a day (BID) | ORAL | Status: DC

## 2013-07-27 MED ORDER — DONEPEZIL HCL 5 MG PO TABS: 5.0000 mg | ORAL_TABLET | Freq: Every day | ORAL | Status: DC

## 2013-07-27 MED ORDER — CLOPIDOGREL BISULFATE 75 MG PO TABS: ORAL_TABLET | ORAL | Status: DC

## 2013-07-27 MED ORDER — SITAGLIPTIN PHOSPHATE 100 MG PO TABS: 100.0000 mg | ORAL_TABLET | Freq: Every day | ORAL | Status: DC

## 2013-07-27 MED ORDER — GLIPIZIDE 10 MG PO TABS: ORAL_TABLET | ORAL | Status: DC

## 2013-07-27 MED ORDER — ISOSORBIDE MONONITRATE ER 30 MG PO TB24: 30.0000 mg | ORAL_TABLET | Freq: Every day | ORAL | Status: DC

## 2013-07-27 NOTE — Telephone Encounter (Signed)
Patient came into office requesting all most of her medications be printed for 90 day supply. She would like them mailed to her home so she may mail them to mail order pharmacy. Prescriptions were printed to be signed and mailed to patient. Some were sent to [facility] in error but the pharmacist was called and instructed to disregard any prescriptions received on this patient .

## 2013-07-27 NOTE — Telephone Encounter (Signed)
Letter with meds placed upfront and  Patient notified.

## 2013-07-27 NOTE — Telephone Encounter (Signed)
Sarah Payne patient returned your call

## 2013-07-27 NOTE — Telephone Encounter (Signed)
Patient came into facility and left med list to be filled on 07/26/13/  Patient called back and stated after three hours that she did not know what was wrong but that if Dr. Derrel Nip did not want to be her Dr. Ky Barban the least could be done is to fill her meds that she is out of meds and she needs them. Accusing staff of not answering her messages. Hour later a second call " i don't know what's wrong with ya'll  You don't answer my call, if Dr. Derrel Nip is firing me fine, but ya'll need to get my meds ready I don't understand why i can't just have my meds." yelling in conversation and demanding. "Should I keep my appointment with her for 07/27/13 or does she want  Me to cancel? If she does fine I won't come but she needs to fill my meds." Patient has no appointment with this facility. Tried to reach patient today to see if I could send scripts electronically patient did not answer left message to return call to office.

## 2013-07-27 NOTE — Telephone Encounter (Signed)
Scripts printed and patient notified to pick up letter placed with scripts,

## 2013-07-27 NOTE — Telephone Encounter (Signed)
Your medication refill request will be addressed according to our refill policy which requires 48 hours notice . I have not fired you  as a patient, I simply can no longer refill your narcotics because your urine drug screen was positive for marijuana, which is illegal in [place].   However, if you  continue to harass my staff., you will have to find yourself another physician because I will not tolerate abuse of my staff .

## 2013-07-27 NOTE — Telephone Encounter (Signed)
I have put the message from Dr. Derrel Nip into a letter from her request and I have had her sign the letter and mailed the letter.

## 2013-08-10 DIAGNOSIS — M7500 Adhesive capsulitis of unspecified shoulder: Secondary | ICD-10-CM | POA: Diagnosis not present

## 2013-08-14 ENCOUNTER — Other Ambulatory Visit: Payer: Self-pay | Admitting: Internal Medicine

## 2013-08-16 ENCOUNTER — Telehealth: Payer: Self-pay | Admitting: Internal Medicine

## 2013-08-16 MED ORDER — CARVEDILOL 6.25 MG PO TABS: 6.2500 mg | ORAL_TABLET | Freq: Two times a day (BID) | ORAL | Status: DC

## 2013-08-16 MED ORDER — ISOSORBIDE MONONITRATE ER 30 MG PO TB24: 30.0000 mg | ORAL_TABLET | Freq: Every day | ORAL | Status: DC

## 2013-08-16 MED ORDER — CLOPIDOGREL BISULFATE 75 MG PO TABS: ORAL_TABLET | ORAL | Status: DC

## 2013-08-16 MED ORDER — LEVOTHYROXINE SODIUM 137 MCG PO CAPS: 137.0000 ug | ORAL_CAPSULE | Freq: Every day | ORAL | Status: DC

## 2013-08-16 NOTE — Telephone Encounter (Signed)
rx sent in electronically 

## 2013-08-16 NOTE — Telephone Encounter (Signed)
The patient has been out of her medication for a couple of days she is needing a weeks worth of her medication called into the pharmacy.  carvedilol (COREG) 6.25 MG tablet   clopidogrel (PLAVIX) 75 MG tablet   isosorbide mononitrate (IMDUR) 30 MG 24 hr tablet   Levothyroxine Sodium 137 MCG CAPS

## 2013-08-17 ENCOUNTER — Ambulatory Visit (INDEPENDENT_AMBULATORY_CARE_PROVIDER_SITE_OTHER): Payer: Medicare Other | Admitting: Internal Medicine

## 2013-08-17 ENCOUNTER — Encounter: Payer: Self-pay | Admitting: Internal Medicine

## 2013-08-17 VITALS — BP 118/72 | HR 70 | Temp 98.0°F | Resp 12 | Wt 147.8 lb

## 2013-08-17 DIAGNOSIS — E1159 Type 2 diabetes mellitus with other circulatory complications: Secondary | ICD-10-CM | POA: Diagnosis not present

## 2013-08-17 DIAGNOSIS — I798 Other disorders of arteries, arterioles and capillaries in diseases classified elsewhere: Secondary | ICD-10-CM | POA: Diagnosis not present

## 2013-08-17 DIAGNOSIS — E1151 Type 2 diabetes mellitus with diabetic peripheral angiopathy without gangrene: Secondary | ICD-10-CM

## 2013-08-17 MED ORDER — METFORMIN HCL 500 MG PO TABS: 1000.0000 mg | ORAL_TABLET | Freq: Two times a day (BID) | ORAL | Status: DC

## 2013-08-17 NOTE — Progress Notes (Signed)
Patient ID: Sarah Payne, female   DOB: May 11, 1946, 68 y.o.   MRN: [id number]  HPI: Sarah Payne is a 68 y.o.-year-old female, returning for f/u for DM2, dx 2003, insulin-dependent since 2013, uncontrolled, with complications (CAD, sCHF, PAD, CKD, PN) and also complicated by dementia and medication noncompliance. Last visit 2 mo ago.  Last hemoglobin A1c was: Lab Results  Component Value Date   HGBA1C 10.0* 05/30/2013   HGBA1C 12.6* 02/25/2013   HGBA1C 9.9* 10/06/2012   Pt is on a regimen of: - NPH 20 << 30 units bid (second dose after dinner) - still takes 30 units NPH once a day in am only if sugars if ~ 200 - Januvia 100 mg daily - takes this every day - Glipizide 10 mg bid - takes this every day - I advised her to start Metformin at last visit - only if sugars <200 at night - now on 500 mg prn  Pt checks her sugars 4-5x a day and they are (no log!): - am: 145-288 >> 80-200 (max 286) >> 68-204 - 2h after b'fast: 156-267 >> ~100 - before lunch: 89-212 >> 159-185 >> 140-170 - 2h after lunch: 134-230 >> lowest 63-204 - before dinner: 64-219 >> 140-202 >> 98 - 2h after dinner: 71, 305 >> ~100 - bedtime: 91-155 >> 119-170 >> ~100 No lows. Lowest sugar was 71 x 1; she has ? hypoglycemia awareness. Highest sugar was 305.  - + CKD, last BUN/creatinine:  Lab Results  Component Value Date   BUN 12 05/30/2013   CREATININE 1.0 05/30/2013  She is on Enalapril. - last set of lipids: Lab Results  Component Value Date   CHOL 212* 02/25/2013   HDL 37.40* 02/25/2013   LDLCALC 70 04/30/2011   LDLDIRECT 111.0 02/25/2013   TRIG 572.0* 02/25/2013   CHOLHDL 6 02/25/2013  She is not on a statin. She is on ASA 81. - last eye exam was in 9 mo ago. No DR.  - + numbness and tingling in her feet.  I reviewed pt's medications, allergies, PMH, social hx, family hx and no changes required, except as mentioned above.  ROS: Constitutional: + weight loss, + fatigue, + chills, + excessive urination Eyes: +  blurry vision, no xerophthalmia ENT: no sore throat, no nodules palpated in throat, no dysphagia/odynophagia, no hoarseness Cardiovascular: + CP/+SOB/no palpitations/leg swelling Respiratory: no cough/+ SOB Gastrointestinal: no N/ V/no D/C Musculoskeletal: + muscle/+ joint aches Skin: no rashes, + easy bruising  Neurological: no tremors/numbness/tingling/dizziness Low libido  PE: BP 118/72  Pulse 70  Temp(Src) 98 F (36.7 C) (Oral)  Resp 12  Wt 147 lb 12.8 oz (67.042 kg)  SpO2 96% Wt Readings from Last 3 Encounters:  08/17/13 147 lb 12.8 oz (67.042 kg)  06/15/13 157 lb (71.215 kg)  05/30/13 159 lb 4 oz (72.235 kg)   Constitutional: normal weight, in NAD, strong tobacco smell Eyes: PERRLA, EOMI, no exophthalmos ENT: moist mucous membranes, no thyromegaly, no cervical lymphadenopathy Cardiovascular: RRR, No MRG Respiratory: CTA B Gastrointestinal: abdomen soft, NT, ND, BS+ Musculoskeletal: no deformities, strength intact in all 4 Skin: moist, warm, no rashes, very dry skin Neurological: no tremor with outstretched hands, DTR normal in all 4  ASSESSMENT: 1. DM2, insulin-dependent, uncontrolled, with complications - med noncompliance (dementia) - CAD - had 3 AMI in 2011 >> 7 stents placed - sCHF - PAD - CKD - PN  PLAN:  1. Patient with long-standing uncontrolled diabetes, on oral + insulin antidiabetic regimen, with poor  med compliance. She lost 10 lbs in last 2 mo! She takes 500 mg Metformin prn and NPH is also on a prn basis! She does not bring a CBG log!  - I suggested to:  Patient Instructions  Please return in 1 month with your sugar log.  Stop NPH. Continue Glipizide. Continue Januvia.  Please add another Metformin tablet (500 mg) with breakfast x 3 days. If you tolerate this well, add another metformin tablet with dinner (total 1000 mg) x 3 days. If you tolerate this well, add another metformin tablets with breakfast (total 1000 mg). Continue with 1000 mg of  metformin twice a day with breakfast and dinner. - If continues to have lows, will decrease the Glipizide - continue checking sugars at different times of the day - check 3 times a day, rotating checks - She is up to date with eye exams >> needs a new one soon - Return to clinic in 1 mo with sugar log

## 2013-08-17 NOTE — Patient Instructions (Signed)
Patient Instructions  Please return in 1 month with your sugar log.  Stop NPH. Continue Glipizide. Continue Januvia.  Please add another Metformin tablet (500 mg) with breakfast x 3 days. If you tolerate this well, add another metformin tablet with dinner (total 1000 mg) x 3 days. If you tolerate this well, add another metformin tablets with breakfast (total 1000 mg). Continue with 1000 mg of metformin twice a day with breakfast and dinner.  Please stop at the lab.

## 2013-08-19 ENCOUNTER — Observation Stay: Payer: Self-pay | Admitting: Internal Medicine

## 2013-08-19 ENCOUNTER — Telehealth: Payer: Self-pay | Admitting: *Deleted

## 2013-08-19 DIAGNOSIS — N058 Unspecified nephritic syndrome with other morphologic changes: Secondary | ICD-10-CM | POA: Diagnosis not present

## 2013-08-19 DIAGNOSIS — Z8249 Family history of ischemic heart disease and other diseases of the circulatory system: Secondary | ICD-10-CM | POA: Diagnosis not present

## 2013-08-19 DIAGNOSIS — Z9119 Patient's noncompliance with other medical treatment and regimen: Secondary | ICD-10-CM | POA: Diagnosis not present

## 2013-08-19 DIAGNOSIS — F3289 Other specified depressive episodes: Secondary | ICD-10-CM | POA: Diagnosis not present

## 2013-08-19 DIAGNOSIS — E785 Hyperlipidemia, unspecified: Secondary | ICD-10-CM | POA: Diagnosis not present

## 2013-08-19 DIAGNOSIS — Z9114 Patient's other noncompliance with medication regimen: Secondary | ICD-10-CM

## 2013-08-19 DIAGNOSIS — J449 Chronic obstructive pulmonary disease, unspecified: Secondary | ICD-10-CM | POA: Diagnosis not present

## 2013-08-19 DIAGNOSIS — M549 Dorsalgia, unspecified: Secondary | ICD-10-CM | POA: Diagnosis not present

## 2013-08-19 DIAGNOSIS — Z9861 Coronary angioplasty status: Secondary | ICD-10-CM | POA: Diagnosis not present

## 2013-08-19 DIAGNOSIS — F0150 Vascular dementia without behavioral disturbance: Secondary | ICD-10-CM | POA: Diagnosis not present

## 2013-08-19 DIAGNOSIS — E1149 Type 2 diabetes mellitus with other diabetic neurological complication: Secondary | ICD-10-CM | POA: Diagnosis not present

## 2013-08-19 DIAGNOSIS — E1142 Type 2 diabetes mellitus with diabetic polyneuropathy: Secondary | ICD-10-CM | POA: Diagnosis not present

## 2013-08-19 DIAGNOSIS — G8929 Other chronic pain: Secondary | ICD-10-CM | POA: Diagnosis not present

## 2013-08-19 DIAGNOSIS — F329 Major depressive disorder, single episode, unspecified: Secondary | ICD-10-CM | POA: Diagnosis not present

## 2013-08-19 DIAGNOSIS — N189 Chronic kidney disease, unspecified: Secondary | ICD-10-CM | POA: Diagnosis not present

## 2013-08-19 DIAGNOSIS — R5383 Other fatigue: Secondary | ICD-10-CM | POA: Diagnosis not present

## 2013-08-19 DIAGNOSIS — I129 Hypertensive chronic kidney disease with stage 1 through stage 4 chronic kidney disease, or unspecified chronic kidney disease: Secondary | ICD-10-CM | POA: Diagnosis not present

## 2013-08-19 DIAGNOSIS — I10 Essential (primary) hypertension: Secondary | ICD-10-CM | POA: Diagnosis not present

## 2013-08-19 DIAGNOSIS — N390 Urinary tract infection, site not specified: Secondary | ICD-10-CM | POA: Diagnosis not present

## 2013-08-19 DIAGNOSIS — Z91199 Patient's noncompliance with other medical treatment and regimen due to unspecified reason: Secondary | ICD-10-CM | POA: Diagnosis not present

## 2013-08-19 DIAGNOSIS — Z823 Family history of stroke: Secondary | ICD-10-CM | POA: Diagnosis not present

## 2013-08-19 DIAGNOSIS — E1169 Type 2 diabetes mellitus with other specified complication: Secondary | ICD-10-CM | POA: Diagnosis not present

## 2013-08-19 DIAGNOSIS — K219 Gastro-esophageal reflux disease without esophagitis: Secondary | ICD-10-CM | POA: Diagnosis not present

## 2013-08-19 DIAGNOSIS — F17200 Nicotine dependence, unspecified, uncomplicated: Secondary | ICD-10-CM | POA: Diagnosis not present

## 2013-08-19 DIAGNOSIS — R5381 Other malaise: Secondary | ICD-10-CM | POA: Diagnosis not present

## 2013-08-19 DIAGNOSIS — E039 Hypothyroidism, unspecified: Secondary | ICD-10-CM | POA: Diagnosis not present

## 2013-08-19 DIAGNOSIS — F0390 Unspecified dementia without behavioral disturbance: Secondary | ICD-10-CM

## 2013-08-19 DIAGNOSIS — E119 Type 2 diabetes mellitus without complications: Secondary | ICD-10-CM | POA: Diagnosis not present

## 2013-08-19 DIAGNOSIS — E1129 Type 2 diabetes mellitus with other diabetic kidney complication: Secondary | ICD-10-CM | POA: Diagnosis not present

## 2013-08-19 DIAGNOSIS — I2510 Atherosclerotic heart disease of native coronary artery without angina pectoris: Secondary | ICD-10-CM | POA: Diagnosis not present

## 2013-08-19 DIAGNOSIS — R0602 Shortness of breath: Secondary | ICD-10-CM | POA: Diagnosis not present

## 2013-08-19 DIAGNOSIS — Z800 Family history of malignant neoplasm of digestive organs: Secondary | ICD-10-CM | POA: Diagnosis not present

## 2013-08-19 DIAGNOSIS — R079 Chest pain, unspecified: Secondary | ICD-10-CM | POA: Diagnosis not present

## 2013-08-19 DIAGNOSIS — I209 Angina pectoris, unspecified: Secondary | ICD-10-CM | POA: Diagnosis not present

## 2013-08-19 LAB — URINALYSIS, COMPLETE
Bilirubin,UR: NEGATIVE
Blood: NEGATIVE
Glucose,UR: NEGATIVE mg/dL (ref 0–75)
Hyaline Cast: 1
Ketone: NEGATIVE
Nitrite: POSITIVE
Ph: 6 (ref 4.5–8.0)
Protein: NEGATIVE
RBC,UR: 1 /HPF (ref 0–5)
Specific Gravity: 1.008 (ref 1.003–1.030)
Squamous Epithelial: 2
WBC UR: 5 /HPF (ref 0–5)

## 2013-08-19 LAB — CBC WITH DIFFERENTIAL/PLATELET
Basophil #: 0.1 10*3/uL (ref 0.0–0.1)
Basophil %: 0.5 %
Eosinophil #: 0.1 10*3/uL (ref 0.0–0.7)
Eosinophil %: 0.8 %
HCT: 43.8 % (ref 35.0–47.0)
HGB: 14.8 g/dL (ref 12.0–16.0)
Lymphocyte #: 2.5 10*3/uL (ref 1.0–3.6)
Lymphocyte %: 24 %
MCH: 31.8 pg (ref 26.0–34.0)
MCHC: 33.8 g/dL (ref 32.0–36.0)
MCV: 94 fL (ref 80–100)
Monocyte #: 0.7 x10 3/mm (ref 0.2–0.9)
Monocyte %: 6.8 %
Neutrophil #: 7 10*3/uL — ABNORMAL HIGH (ref 1.4–6.5)
Neutrophil %: 67.9 %
Platelet: 271 10*3/uL (ref 150–440)
RBC: 4.66 10*6/uL (ref 3.80–5.20)
RDW: 14.3 % (ref 11.5–14.5)
WBC: 10.3 10*3/uL (ref 3.6–11.0)

## 2013-08-19 LAB — DRUG SCREEN, URINE
Amphetamines, Ur Screen: NEGATIVE (ref ?–1000)
Barbiturates, Ur Screen: NEGATIVE (ref ?–200)
Benzodiazepine, Ur Scrn: NEGATIVE (ref ?–200)
Cannabinoid 50 Ng, Ur ~~LOC~~: POSITIVE (ref ?–50)
Cocaine Metabolite,Ur ~~LOC~~: NEGATIVE (ref ?–300)
MDMA (Ecstasy)Ur Screen: NEGATIVE (ref ?–500)
Methadone, Ur Screen: NEGATIVE (ref ?–300)
Opiate, Ur Screen: NEGATIVE (ref ?–300)
Phencyclidine (PCP) Ur S: NEGATIVE (ref ?–25)
Tricyclic, Ur Screen: POSITIVE (ref ?–1000)

## 2013-08-19 LAB — HEMOGLOBIN A1C: Hemoglobin A1C: 7.3 % — ABNORMAL HIGH (ref 4.2–6.3)

## 2013-08-19 LAB — BASIC METABOLIC PANEL
Anion Gap: 6 — ABNORMAL LOW (ref 7–16)
BUN: 20 mg/dL — ABNORMAL HIGH (ref 7–18)
Calcium, Total: 9 mg/dL (ref 8.5–10.1)
Chloride: 100 mmol/L (ref 98–107)
Co2: 27 mmol/L (ref 21–32)
Creatinine: 1.22 mg/dL (ref 0.60–1.30)
EGFR (African American): 54 — ABNORMAL LOW
EGFR (Non-African Amer.): 46 — ABNORMAL LOW
Glucose: 180 mg/dL — ABNORMAL HIGH (ref 65–99)
Osmolality: 274 (ref 275–301)
Potassium: 4.1 mmol/L (ref 3.5–5.1)
Sodium: 133 mmol/L — ABNORMAL LOW (ref 136–145)

## 2013-08-19 LAB — CK TOTAL AND CKMB (NOT AT ARMC)
CK, Total: 125 U/L (ref 21–215)
CK, Total: 120 U/L (ref 21–215)
CK, Total: 107 U/L (ref 21–215)
CK-MB: 1.8 ng/mL (ref 0.5–3.6)
CK-MB: 1.5 ng/mL (ref 0.5–3.6)
CK-MB: 1.4 ng/mL (ref 0.5–3.6)

## 2013-08-19 LAB — TROPONIN I
Troponin-I: <0.02 ng/mL
Troponin-I: <0.02 ng/mL
Troponin-I: <0.02 ng/mL

## 2013-08-19 LAB — TSH: Thyroid Stimulating Horm: 44.2 u[IU]/mL — ABNORMAL HIGH

## 2013-08-19 NOTE — Telephone Encounter (Signed)
Is the referral for [facility] no longer active?  They were seeing her this time last year.  Never mind, they are not helping  [facility] referral in process

## 2013-08-19 NOTE — Telephone Encounter (Signed)
Patient daughter called in reference to patient being sleepy and not being able to stay away. Per daughter her mother had been out of her medication for almost 3 weeks, she did not taper off of them or anything. She thinks on Sunday her mother began taking all her medications again, for about the last 3 days she has had trouble staying awake, off and on intermittent pain between shoulder blades and feeling like short of breath. Informed her to take patient to the ED to be evaluated. While on the phone with patient daughter I asked her if patient went to her appointment with Dr. Renne Crigler, I heard patient in the background tell her no. However patient did show up for appointment, at her appointment all her vitals were within normal limits. Daughter also stated her mother has been confused a lot lately and would like some help from a home health agency. Her mother needs someone to come in to check on her during the day and make sure she is taking her medication as directed. Daughter is taking her to the ED and would like referral to home health if possible.

## 2013-08-19 NOTE — Telephone Encounter (Signed)
Left detailed message on voicemail that referral to [facility] was done and someone will be contacting you.

## 2013-08-20 DIAGNOSIS — R079 Chest pain, unspecified: Secondary | ICD-10-CM | POA: Diagnosis not present

## 2013-08-20 DIAGNOSIS — N390 Urinary tract infection, site not specified: Secondary | ICD-10-CM | POA: Diagnosis not present

## 2013-08-20 DIAGNOSIS — I10 Essential (primary) hypertension: Secondary | ICD-10-CM | POA: Diagnosis not present

## 2013-08-20 DIAGNOSIS — E119 Type 2 diabetes mellitus without complications: Secondary | ICD-10-CM | POA: Diagnosis not present

## 2013-08-20 LAB — COMPREHENSIVE METABOLIC PANEL
Albumin: 3.5 g/dL (ref 3.4–5.0)
Alkaline Phosphatase: 72 U/L
Anion Gap: 4 — ABNORMAL LOW (ref 7–16)
BUN: 19 mg/dL — ABNORMAL HIGH (ref 7–18)
Bilirubin,Total: 0.3 mg/dL (ref 0.2–1.0)
Calcium, Total: 8.8 mg/dL (ref 8.5–10.1)
Chloride: 102 mmol/L (ref 98–107)
Co2: 27 mmol/L (ref 21–32)
Creatinine: 1.2 mg/dL (ref 0.60–1.30)
EGFR (African American): 55 — ABNORMAL LOW
EGFR (Non-African Amer.): 47 — ABNORMAL LOW
Glucose: 143 mg/dL — ABNORMAL HIGH (ref 65–99)
Osmolality: 271 (ref 275–301)
Potassium: 4.2 mmol/L (ref 3.5–5.1)
SGOT(AST): 16 U/L (ref 15–37)
SGPT (ALT): 17 U/L (ref 12–78)
Sodium: 133 mmol/L — ABNORMAL LOW (ref 136–145)
Total Protein: 6.7 g/dL (ref 6.4–8.2)

## 2013-08-20 LAB — CBC WITH DIFFERENTIAL/PLATELET
Basophil #: 0 10*3/uL (ref 0.0–0.1)
Basophil %: 0.5 %
Eosinophil #: 0.1 10*3/uL (ref 0.0–0.7)
Eosinophil %: 0.9 %
HCT: 41 % (ref 35.0–47.0)
HGB: 13.9 g/dL (ref 12.0–16.0)
Lymphocyte #: 2.9 10*3/uL (ref 1.0–3.6)
Lymphocyte %: 30 %
MCH: 32 pg (ref 26.0–34.0)
MCHC: 34 g/dL (ref 32.0–36.0)
MCV: 94 fL (ref 80–100)
Monocyte #: 0.6 x10 3/mm (ref 0.2–0.9)
Monocyte %: 6.7 %
Neutrophil #: 5.9 10*3/uL (ref 1.4–6.5)
Neutrophil %: 61.9 %
Platelet: 243 10*3/uL (ref 150–440)
RBC: 4.34 10*6/uL (ref 3.80–5.20)
RDW: 14.3 % (ref 11.5–14.5)
WBC: 9.5 10*3/uL (ref 3.6–11.0)

## 2013-08-21 LAB — URINE CULTURE

## 2013-08-23 ENCOUNTER — Telehealth: Payer: Self-pay | Admitting: *Deleted

## 2013-08-23 NOTE — Telephone Encounter (Signed)
Patient contacted regarding discharge from [facility] on 08/20/13.  Patient understands to follow up with provider Gollan on 08/31/13 at 0930am at [facility] Patient understands discharge instructions? yes Patient understands medications and regiment? yes Patient understands to bring all medications to this visit? yes

## 2013-08-31 ENCOUNTER — Ambulatory Visit (INDEPENDENT_AMBULATORY_CARE_PROVIDER_SITE_OTHER): Payer: Medicare Other | Admitting: Cardiovascular Disease

## 2013-08-31 ENCOUNTER — Encounter: Payer: Self-pay | Admitting: Cardiovascular Disease

## 2013-08-31 VITALS — BP 100/80 | HR 88 | Ht 67.0 in | Wt 144.5 lb

## 2013-08-31 DIAGNOSIS — J449 Chronic obstructive pulmonary disease, unspecified: Secondary | ICD-10-CM

## 2013-08-31 DIAGNOSIS — R0602 Shortness of breath: Secondary | ICD-10-CM

## 2013-08-31 DIAGNOSIS — E785 Hyperlipidemia, unspecified: Secondary | ICD-10-CM

## 2013-08-31 DIAGNOSIS — I2510 Atherosclerotic heart disease of native coronary artery without angina pectoris: Secondary | ICD-10-CM

## 2013-08-31 DIAGNOSIS — I5022 Chronic systolic (congestive) heart failure: Secondary | ICD-10-CM

## 2013-08-31 DIAGNOSIS — Z720 Tobacco use: Secondary | ICD-10-CM

## 2013-08-31 DIAGNOSIS — E1151 Type 2 diabetes mellitus with diabetic peripheral angiopathy without gangrene: Secondary | ICD-10-CM

## 2013-08-31 NOTE — Assessment & Plan Note (Signed)
Recommended smoking cessation. 

## 2013-08-31 NOTE — Assessment & Plan Note (Addendum)
No changes to the medications were made.

## 2013-08-31 NOTE — Assessment & Plan Note (Signed)
We have encouraged her to continue to work on weaning her cigarettes and smoking cessation. She will continue to work on this and does not want any assistance with chantix.  

## 2013-08-31 NOTE — Progress Notes (Signed)
 Patient ID: Sarah Payne, female    DOB: 06/13/1946, 67 y.o.   MRN: [id number]  HPI Comments: 67-year-old woman with a long history of smoking, coronary artery disease, prior stent x3 placed to the mid left circumflex with moderate to severe LAD disease , stent placed to her distal RCA in February 2013, stent to the mid left circumflex in March 2013 , poorly controlled diabetes with hemoglobin A1c of 10, depression, hypertension, cardiac catheterization 08/19/2012 showing moderate mid LAD disease at the takeoff of the diagonal vessel, severe ostial to mid RCA disease, ejection fraction greater than 55%. She had FFR pressure wire showing severe disease of the RCA with drug-eluting stent x2 placed.  In followup today, she reports that she ran out of her medications and was unable to get refills. She did not feel well, was very sleepy and presented to the hospital. Was evaluated by cardiology on 08/19/2013. It was felt that her atypical chest pain symptoms was secondary to being off her medications. TSH was 44. As recommended that she stop smoking. Hemoglobin A1c at that time was 7.3. It was felt that she had mild dementia and home health referral was made by primary care physician. In followup today, she reports that she continues to be tired. She is requesting refills of her cholesterol medication, Lipitor and nitroglycerin. She was discharged on 08/20/2013  Lab work from August 2014 shows total cholesterol 212, HDL 37, triglycerides 572, LDL 111  No significant chest pain or shortness of breath with exertion. No anginal symptoms.  Echocardiogram February 07, 2011 shows normal LV systolic function, diastolic dysfunction, moderate inferior wall hypokinesis, normal right ventricular systolic pressures.  EKG shows normal sinus rhythm with rate 88 beats per minute with T-wave abnormality in leads  one and aVL   Outpatient Encounter Prescriptions as of 08/31/2013  Medication Sig  . albuterol (PROVENTIL) (2.5  MG/3ML) 0.083% nebulizer solution Take 3 mLs (2.5 mg total) by nebulization every 6 (six) hours as needed for wheezing.  . aspirin 81 MG tablet Take 81 mg by mouth daily.    . Calcium & Magnesium Carbonates (MYLANTA PO) Take by mouth.    . carvedilol (COREG) 6.25 MG tablet Take 1 tablet (6.25 mg total) by mouth 2 (two) times daily.  . clobetasol ointment (TEMOVATE) 0.05 % APPLY EVERY NIGHT TO VAGINAL FOLDS  . clopidogrel (PLAVIX) 75 MG tablet TAKE 1 TABLET (75 MG TOTAL) BY MOUTH DAILY.  . cyanocobalamin (,VITAMIN B-12,) 1000 MCG/ML injection INJECT 1 ML (1,000 MCG TOTAL) INTO THE MUSCLE ONCE.  . donepezil (ARICEPT) 5 MG tablet Take 1 tablet (5 mg total) by mouth at bedtime.  . DULoxetine (CYMBALTA) 60 MG capsule TAKE 1 CAPSULE BY MOUTH DAILY.  . enalapril (VASOTEC) 10 MG tablet Take 1 tablet (10 mg total) by mouth daily.  . etodolac (LODINE) 400 MG tablet Take 1 tablet (400 mg total) by mouth 2 (two) times daily.  . fluticasone (FLOVENT HFA) 220 MCG/ACT inhaler Inhale 1 puff into the lungs 2 (two) times daily.  . gabapentin (NEURONTIN) 100 MG capsule Take 1 capsule (100 mg total) by mouth 3 (three) times daily.  . glipiZIDE (GLUCOTROL) 10 MG tablet Before breakfast and supper (evening meal)  . glucose blood (FREESTYLE LITE) test strip Check blood sugar 3 times daily to check blood sugars - Uncontrolled diabetes mellitus  . glucose monitoring kit (FREESTYLE) monitoring kit 1 each by Does not apply route as needed for other. PHARMACY PLEASE SUBSTITUTE WHATEVER GLUCOMETER YOU CARRY  .   isosorbide mononitrate (IMDUR) 30 MG 24 hr tablet Take 1 tablet (30 mg total) by mouth daily.  . Levothyroxine Sodium 137 MCG CAPS Take 1 capsule (137 mcg total) by mouth daily before breakfast.  . magnesium oxide (MAG-OX) 400 MG tablet Take 1 tablet (400 mg total) by mouth daily.  . metFORMIN (GLUCOPHAGE) 500 MG tablet Take 2 tablets (1,000 mg total) by mouth 2 (two) times daily with a meal.  . nitroGLYCERIN  (NITROSTAT) 0.4 MG SL tablet Place 1 tablet (0.4 mg total) under the tongue every 5 (five) minutes as needed.  . ondansetron (ZOFRAN-ODT) 8 MG disintegrating tablet Take 1 tablet (8 mg total) by mouth every 8 (eight) hours as needed for nausea.  . sitaGLIPtin (JANUVIA) 100 MG tablet Take 1 tablet (100 mg total) by mouth daily.  . tiotropium (SPIRIVA HANDIHALER) 18 MCG inhalation capsule Place 1 capsule (18 mcg total) into inhaler and inhale daily.  . traZODone (DESYREL) 100 MG tablet TAKE 1 TABLET (100 MG TOTAL) BY MOUTH AT BEDTIME.   Review of Systems  Constitutional: Positive for fatigue.       Malaise, sweating  HENT: Negative.   Eyes: Negative.   Respiratory: Negative.   Cardiovascular: Negative.   Gastrointestinal: Negative.   Endocrine: Negative.   Musculoskeletal: Negative.   Skin: Negative.   Allergic/Immunologic: Negative.   Neurological: Negative.   Hematological: Negative.   Psychiatric/Behavioral: Negative.   All other systems reviewed and are negative.   BP 100/80  Pulse 88  Ht 5' 7" (1.702 m)  Wt 144 lb 8 oz (65.545 kg)  BMI 22.63 kg/m2  Physical Exam  Nursing note and vitals reviewed. Constitutional: She is oriented to person, place, and time. She appears well-developed and well-nourished.  HENT:  Head: Normocephalic.  Nose: Nose normal.  Mouth/Throat: Oropharynx is clear and moist.  Eyes: Conjunctivae are normal. Pupils are equal, round, and reactive to light.  Neck: Normal range of motion. Neck supple. No JVD present.  Cardiovascular: Normal rate, regular rhythm, S1 normal, S2 normal, normal heart sounds and intact distal pulses.  Exam reveals no gallop and no friction rub.   No murmur heard. Pulmonary/Chest: Effort normal and breath sounds normal. No respiratory distress. She has no wheezes. She has no rales. She exhibits no tenderness.  Abdominal: Soft. Bowel sounds are normal. She exhibits no distension. There is no tenderness.  Musculoskeletal: Normal  range of motion. She exhibits no edema and no tenderness.  Lymphadenopathy:    She has no cervical adenopathy.  Neurological: She is alert and oriented to person, place, and time. Coordination normal.  Skin: Skin is warm and dry. No rash noted. No erythema.  Psychiatric: She has a normal mood and affect. Her behavior is normal. Judgment and thought content normal.    Assessment and Plan        

## 2013-08-31 NOTE — Assessment & Plan Note (Signed)
Appears to be relatively euvolemic on today's visit. No changes to her medications

## 2013-08-31 NOTE — Patient Instructions (Signed)
You are doing well. No medication changes were made.  We will order an echocardiogram for shortness of breath  Please call us if you have new issues that need to be addressed before your next appt.  Your physician wants you to follow-up in: 6 months.  You will receive a reminder letter in the mail two months in advance. If you don't receive a letter, please call our office to schedule the follow-up appointment.

## 2013-08-31 NOTE — Assessment & Plan Note (Signed)
We have encouraged continued exercise, careful diet management in an effort to lose weight. 

## 2013-08-31 NOTE — Assessment & Plan Note (Signed)
Currently with no symptoms of angina. No further workup at this time. Continue current medication regimen. 

## 2013-09-06 ENCOUNTER — Other Ambulatory Visit: Payer: Medicare Other

## 2013-09-16 ENCOUNTER — Other Ambulatory Visit (INDEPENDENT_AMBULATORY_CARE_PROVIDER_SITE_OTHER): Payer: Medicare Other

## 2013-09-16 ENCOUNTER — Other Ambulatory Visit: Payer: Self-pay

## 2013-09-16 DIAGNOSIS — I509 Heart failure, unspecified: Secondary | ICD-10-CM

## 2013-09-16 DIAGNOSIS — R0602 Shortness of breath: Secondary | ICD-10-CM

## 2013-09-16 DIAGNOSIS — I2510 Atherosclerotic heart disease of native coronary artery without angina pectoris: Secondary | ICD-10-CM | POA: Diagnosis not present

## 2013-09-16 HISTORY — DX: Heart failure, unspecified: I50.9

## 2013-09-18 DIAGNOSIS — E1159 Type 2 diabetes mellitus with other circulatory complications: Secondary | ICD-10-CM | POA: Diagnosis not present

## 2013-09-18 DIAGNOSIS — I798 Other disorders of arteries, arterioles and capillaries in diseases classified elsewhere: Secondary | ICD-10-CM

## 2013-09-18 DIAGNOSIS — I509 Heart failure, unspecified: Secondary | ICD-10-CM

## 2013-09-18 DIAGNOSIS — I5022 Chronic systolic (congestive) heart failure: Secondary | ICD-10-CM

## 2013-09-18 DIAGNOSIS — J449 Chronic obstructive pulmonary disease, unspecified: Secondary | ICD-10-CM

## 2013-09-18 DIAGNOSIS — E785 Hyperlipidemia, unspecified: Secondary | ICD-10-CM

## 2013-09-18 DIAGNOSIS — I2510 Atherosclerotic heart disease of native coronary artery without angina pectoris: Secondary | ICD-10-CM | POA: Diagnosis not present

## 2013-09-18 DIAGNOSIS — R0602 Shortness of breath: Secondary | ICD-10-CM | POA: Diagnosis not present

## 2013-09-18 DIAGNOSIS — F17200 Nicotine dependence, unspecified, uncomplicated: Secondary | ICD-10-CM

## 2013-09-21 ENCOUNTER — Ambulatory Visit: Payer: Medicare Other | Admitting: Cardiovascular Disease

## 2013-10-05 ENCOUNTER — Encounter: Payer: Self-pay | Admitting: Internal Medicine

## 2013-10-05 ENCOUNTER — Ambulatory Visit (INDEPENDENT_AMBULATORY_CARE_PROVIDER_SITE_OTHER): Payer: Medicare Other | Admitting: Internal Medicine

## 2013-10-05 VITALS — BP 114/78 | HR 94 | Temp 97.9°F | Resp 12 | Wt 147.0 lb

## 2013-10-05 DIAGNOSIS — E1159 Type 2 diabetes mellitus with other circulatory complications: Secondary | ICD-10-CM | POA: Diagnosis not present

## 2013-10-05 DIAGNOSIS — I798 Other disorders of arteries, arterioles and capillaries in diseases classified elsewhere: Secondary | ICD-10-CM

## 2013-10-05 DIAGNOSIS — E1151 Type 2 diabetes mellitus with diabetic peripheral angiopathy without gangrene: Secondary | ICD-10-CM

## 2013-10-05 DIAGNOSIS — I2510 Atherosclerotic heart disease of native coronary artery without angina pectoris: Secondary | ICD-10-CM | POA: Diagnosis not present

## 2013-10-05 NOTE — Progress Notes (Signed)
Patient ID: Sarah Payne, female   DOB: January 04, 1946, 68 y.o.   MRN: [id number]  HPI: Sarah Payne is a 68 y.o.-year-old female, returning for f/u for DM2, dx 2003, insulin-dependent since 2013, uncontrolled, with complications (CAD, sCHF, PAD, CKD, PN) and also complicated by dementia and medication noncompliance. Last visit 1.5 mo ago.  Last hemoglobin A1c was: Lab Results  Component Value Date   HGBA1C 10.0* 05/30/2013   HGBA1C 12.6* 02/25/2013   HGBA1C 9.9* 10/06/2012   Pt is on a regimen of: - Januvia 100 mg daily - Glipizide 10 mg bid - Metformin 500 mg at lunch and 500 mg after dinner << despite advice to take 1000 mg bid  We stopped NPH at last visit , but she restarted injecting NPH 20 units in am >> lows in pm  Pt checks her sugars 4-5x a day and they are (per her log - which is great): - am: 145-288 >> 80-200 (max 286) >> 68-204 >> 78-204 - 2h after b'fast: 156-267 >> ~100 >> 131, 135, 146, 149 - before lunch: 89-212 >> 159-185 >> 140-170 >> 78-178 - 2h after lunch: 134-230 >> lowest 63-204 >> 60-189, most <140 - before dinner: 64-219 >> 140-202 >> 98 >> 69-125 - 2h after dinner: 71, 305 >> ~100 >> 79-108 - bedtime: 91-155 >> 119-170 >> ~100 >> 110-149 (253) - nighttime: 54, 58, 145,  No lows. Lowest sugar was 54; she has ? hypoglycemia awareness. Highest sugar was 253, but most of the sugars <170.  - + CKD, last BUN/creatinine:  Lab Results  Component Value Date   BUN 12 05/30/2013   CREATININE 1.0 05/30/2013  She is on Enalapril. - last set of lipids: Lab Results  Component Value Date   CHOL 212* 02/25/2013   HDL 37.40* 02/25/2013   LDLCALC 70 04/30/2011   LDLDIRECT 111.0 02/25/2013   TRIG 572.0* 02/25/2013   CHOLHDL 6 02/25/2013  She is not on a statin. She is on ASA 81. - last eye exam was in 10.5 mo ago. No DR.  - + numbness and tingling in her feet.  I reviewed pt's medications, allergies, PMH, social hx, family hx and no changes required, except as mentioned  above.  ROS: Constitutional: no more weight loss, + fatigue, + feeling hot, + excessive urination Eyes: no more blurry vision, no xerophthalmia ENT: no sore throat, no nodules palpated in throat, no dysphagia/odynophagia, no hoarseness Cardiovascular: no CP/SOB/no palpitations/leg swelling Respiratory: no cough/SOB Gastrointestinal: no N/ V/no D/C Musculoskeletal: + muscle/+ joint aches Skin: no rashes Neurological: no tremors/numbness/tingling/dizziness  PE: BP 114/78  Pulse 94  Temp(Src) 97.9 F (36.6 C) (Oral)  Resp 12  Wt 147 lb (66.679 kg)  SpO2 99% Wt Readings from Last 3 Encounters:  10/05/13 147 lb (66.679 kg)  08/31/13 144 lb 8 oz (65.545 kg)  08/17/13 147 lb 12.8 oz (67.042 kg)   Constitutional: normal weight, in NAD Eyes: PERRLA, EOMI, no exophthalmos ENT: moist mucous membranes, no thyromegaly, no cervical lymphadenopathy Cardiovascular: RRR, No MRG Respiratory: CTA B Gastrointestinal: abdomen soft, NT, ND, BS+ Musculoskeletal: no deformities, strength intact in all 4 Skin: moist, warm, no rashes, very dry skin Neurological: no tremor with outstretched hands, DTR normal in all 4  ASSESSMENT: 1. DM2, insulin-dependent, uncontrolled, with complications - med noncompliance (dementia) - CAD - had 3 AMI in 2011 >> 7 stents placed - sCHF - PAD - CKD - PN  PLAN:  1. Patient with long-standing uncontrolled diabetes, on oral +  insulin antidiabetic regimen, with poor med compliance >> appears to understand the instructions at the time of the visit, but then comes up with her own regimen... I need to give her a more clear set of instructions. She takes 500 mg Metformin only bid and added back NPH in am despite my advice to use Metformin 1000 mg bid and stop NPH advice! She still has lows at night and in the afternoon. - I suggested to:  Patient Instructions  Please continue Januvia 100 mg in am. Decrease Glipizide to 5 mg 2x a day: with breakfast and with  dinner. Increase Metformin to 1000 mg 2x a day: with breakfast and with dinner. Stop NPH.   Breakfast Lunch Dinner  Januvia 100 mg  1 tab x x  Glipizide 10 mg  1/2 tab x 1/2 tab  Metformin 500 mg  2 tabs x 2 tabs   Please stop at the lab. - continue checking sugars at different times of the day - check 3 times a day, rotating checks - doing a great job with this! Now has a binder of logs put together by her future daughter in law! - She is up to date with eye exams >> needs a new one soon - check A1c today. - Return to clinic in 1.5 mo with sugar log   Orders Placed This Encounter  Procedures  . Hemoglobin A1c    Standing Status: Future     Number of Occurrences:      Standing Expiration Date: 10/06/2014   Pt did not stop at the lab >> will check A1c at next visit.

## 2013-10-05 NOTE — Patient Instructions (Addendum)
Please continue Januvia 100 mg in am. Decrease Glipizide to 5 mg 2x a day: with breakfast and with dinner. Increase Metformin to 1000 mg 2x a day: with breakfast and with dinner. Stop NPH.   Breakfast Lunch Dinner  Januvia 100 mg  1 tab x x  Glipizide 10 mg  1/2 tab xx 1/2 tab  Metformin 500 mg  2 tabs x 2 tabs   Please stop at the lab.

## 2013-10-12 DIAGNOSIS — M7500 Adhesive capsulitis of unspecified shoulder: Secondary | ICD-10-CM | POA: Diagnosis not present

## 2013-11-22 ENCOUNTER — Ambulatory Visit (INDEPENDENT_AMBULATORY_CARE_PROVIDER_SITE_OTHER): Payer: Medicare Other | Admitting: Family Medicine

## 2013-11-22 ENCOUNTER — Encounter: Payer: Self-pay | Admitting: Family Medicine

## 2013-11-22 VITALS — BP 100/60 | HR 62 | Temp 98.0°F | Ht 67.0 in | Wt 146.5 lb

## 2013-11-22 DIAGNOSIS — E039 Hypothyroidism, unspecified: Secondary | ICD-10-CM | POA: Diagnosis not present

## 2013-11-22 DIAGNOSIS — Z720 Tobacco use: Secondary | ICD-10-CM

## 2013-11-22 DIAGNOSIS — I798 Other disorders of arteries, arterioles and capillaries in diseases classified elsewhere: Secondary | ICD-10-CM | POA: Diagnosis not present

## 2013-11-22 DIAGNOSIS — R892 Abnormal level of other drugs, medicaments and biological substances in specimens from other organs, systems and tissues: Secondary | ICD-10-CM

## 2013-11-22 DIAGNOSIS — F17200 Nicotine dependence, unspecified, uncomplicated: Secondary | ICD-10-CM

## 2013-11-22 DIAGNOSIS — R825 Elevated urine levels of drugs, medicaments and biological substances: Secondary | ICD-10-CM | POA: Insufficient documentation

## 2013-11-22 DIAGNOSIS — I2510 Atherosclerotic heart disease of native coronary artery without angina pectoris: Secondary | ICD-10-CM

## 2013-11-22 DIAGNOSIS — E1159 Type 2 diabetes mellitus with other circulatory complications: Secondary | ICD-10-CM | POA: Diagnosis not present

## 2013-11-22 DIAGNOSIS — E1151 Type 2 diabetes mellitus with diabetic peripheral angiopathy without gangrene: Secondary | ICD-10-CM

## 2013-11-22 LAB — TSH: TSH: 0.06 u[IU]/mL — ABNORMAL LOW (ref 0.35–4.50)

## 2013-11-22 LAB — T4, FREE: Free T4: 1.58 ng/dL (ref 0.60–1.60)

## 2013-11-22 LAB — HEMOGLOBIN A1C: Hgb A1c MFr Bld: 8 % — ABNORMAL HIGH (ref 4.6–6.5)

## 2013-11-22 MED ORDER — ETODOLAC 400 MG PO TABS: 400.0000 mg | ORAL_TABLET | Freq: Two times a day (BID) | ORAL | Status: DC

## 2013-11-22 NOTE — Patient Instructions (Signed)
It was nice to meet you. I will call you with your lab results.  Please come see me for a medicare wellness visit.

## 2013-11-22 NOTE — Assessment & Plan Note (Signed)
We had a long discussion about this today. She is aware that I am not going to prescribe controlled substances for her at this time but should we need them for any reason, she will need to sign another contract with [place] and undergo UDS. She agreed with plan.

## 2013-11-22 NOTE — Assessment & Plan Note (Signed)
Lab Results  Component Value Date   TSH 5.03 02/25/2013   Recheck thyroid function due to fatigue.

## 2013-11-22 NOTE — Progress Notes (Signed)
Subjective:   Patient ID: Sarah Payne, female    DOB: 17-Oct-1945, 67 y.o.   MRN: [id number]  Sarah Payne is a pleasant 68 y.o. year old female who presents to clinic today with [place]  on 11/22/2013  HPI: Establishing care from Dr. Derrel Nip- she said because "she did not like that I smoked marijuana." UDS results reviewed- on 05/30/13- tested positive for opiates, amphetamines and marijuana.  Per pt, at that time, she had frozen shoulder and was taking hydrocodone for this (Dr. [name]) and does admit to smoking "weed."  She denies taking amphetamines.  DM- was seeing Dr. Cruzita Lederer but does not want to drive to [place] anymore.  Last saw her on 10/05/13- note reviewed. FSBS much better- checking them three times daily.  Brings in log today- ranging 78-139.  Denies episodes of hypoglycemia. On glucotrol 10 mg twice daily, Metformin 1000 mg twice daily, and Januvia 100 mg daily. Does have sweats from Metformin. Lab Results  Component Value Date   HGBA1C 10.0* 05/30/2013   Patient Active Problem List   Diagnosis Date Noted  . Positive urine drug screen 11/22/2013  . Pain in joint, upper arm 04/12/2013  . Myalgia 04/06/2013  . COPD exacerbation 03/29/2013  . COPD, moderate 03/29/2013  . Sweating 03/24/2013  . Malaise 03/24/2013  . Noncompliance with diabetes treatment 02/27/2013  . Unspecified gastritis and gastroduodenitis without mention of hemorrhage 02/27/2013  . Obstructive sleep apnea 02/25/2013  . Leg weakness, bilateral 10/07/2012  . Other malaise and fatigue 10/06/2012  . Chronic systolic congestive heart failure 05/21/2012  . Lichen sclerosus et atrophicus 05/20/2012  . Bronchitis, chronic obstructive 05/20/2012  . Headache 10/15/2011  . Angina at rest 09/25/2011  . Diabetes mellitus type 2 with peripheral artery disease 08/28/2011  . History of lumbar discectomy 06/29/2011  . Depression 06/24/2011  . Hypothyroidism 05/03/2011  . CAD, multiple vessel 02/24/2011  .  Tobacco abuse 02/24/2011  . Hyperlipidemia 02/24/2011   Past Medical History  Diagnosis Date  . Diabetes mellitus   . Hypertension   . Hypothyroidism   . Depression   . Vascular dementia   . Tobacco abuse     a. 45 pack year history - quit 09/16/11  . Chronic low back pain   . Coronary artery disease     a. s/p PCI/DES LCX x 3;  b. NSTEMI 09/16/11;  c.   09/17/11 Cath [facility]) Severe RCA/LCX dzs - RCA treated, LCX intervention pending w/ Dr. Clayborn Bigness   Past Surgical History  Procedure Laterality Date  . Back surgery    . Abdominal hysterectomy    . Appendectomy    . Cardiac catheterization  2013  . Coronary angioplasty  2012    stent x 3   . Coronary angioplasty with stent placement  2013   History  Substance Use Topics  . Smoking status: Current Every Day Smoker -- 0.25 packs/day for 45 years    Types: Cigarettes  . Smokeless tobacco: Never Used     Comment: Has cut back, trying to quit. Using the E-cigarette.  . Alcohol Use: No   Family History  Problem Relation Age of Onset  . Heart attack Mother     First MI @ 84 - Died @ 41  . Heart disease Mother   . Heart disease Father     Died @ 68  . Throat cancer Brother    Allergies  Allergen Reactions  . No Known Allergies    Current Outpatient Prescriptions  on File Prior to Visit  Medication Sig Dispense Refill  . albuterol (PROVENTIL) (2.5 MG/3ML) 0.083% nebulizer solution Take 3 mLs (2.5 mg total) by nebulization every 6 (six) hours as needed for wheezing.  75 mL  3  . aspirin 81 MG tablet Take 81 mg by mouth daily.        Marland Kitchen atorvastatin (LIPITOR) 40 MG tablet Take 1 tablet (40 mg total) by mouth daily.  90 tablet  3  . Calcium & Magnesium Carbonates (MYLANTA PO) Take by mouth.        . carvedilol (COREG) 6.25 MG tablet Take 1 tablet (6.25 mg total) by mouth 2 (two) times daily.  14 tablet  0  . clobetasol ointment (TEMOVATE) 0.05 % APPLY EVERY NIGHT TO VAGINAL FOLDS  60 g  1  . clopidogrel (PLAVIX) 75 MG tablet TAKE  1 TABLET (75 MG TOTAL) BY MOUTH DAILY.  7 tablet  0  . cyanocobalamin (,VITAMIN B-12,) 1000 MCG/ML injection INJECT 1 ML (1,000 MCG TOTAL) INTO THE MUSCLE ONCE.  10 mL  3  . donepezil (ARICEPT) 5 MG tablet Take 1 tablet (5 mg total) by mouth at bedtime.  90 tablet  1  . DULoxetine (CYMBALTA) 60 MG capsule TAKE 1 CAPSULE BY MOUTH DAILY.  90 capsule  1  . enalapril (VASOTEC) 10 MG tablet Take 1 tablet (10 mg total) by mouth daily.  90 tablet  1  . fluticasone (FLOVENT HFA) 220 MCG/ACT inhaler Inhale 1 puff into the lungs 2 (two) times daily.  2 Inhaler  1  . gabapentin (NEURONTIN) 100 MG capsule Take 1 capsule (100 mg total) by mouth 3 (three) times daily.  270 capsule  1  . glipiZIDE (GLUCOTROL) 10 MG tablet Before breakfast and supper (evening meal)  180 tablet  1  . glucose blood (FREESTYLE LITE) test strip Check blood sugar 3 times daily to check blood sugars - Uncontrolled diabetes mellitus  300 each  3  . glucose monitoring kit (FREESTYLE) monitoring kit 1 each by Does not apply route as needed for other. PHARMACY PLEASE SUBSTITUTE WHATEVER GLUCOMETER YOU CARRY  1 each  1  . isosorbide mononitrate (IMDUR) 30 MG 24 hr tablet Take 1 tablet (30 mg total) by mouth daily.  7 tablet  0  . Levothyroxine Sodium 137 MCG CAPS Take 1 capsule (137 mcg total) by mouth daily before breakfast.  7 capsule  0  . magnesium oxide (MAG-OX) 400 MG tablet Take 1 tablet (400 mg total) by mouth daily.  90 tablet  1  . metFORMIN (GLUCOPHAGE) 500 MG tablet Take 2 tablets (1,000 mg total) by mouth 2 (two) times daily with a meal.  180 tablet  1  . nitroGLYCERIN (NITROSTAT) 0.4 MG SL tablet Place 1 tablet (0.4 mg total) under the tongue every 5 (five) minutes as needed.  30 tablet  1  . ondansetron (ZOFRAN-ODT) 8 MG disintegrating tablet Take 1 tablet (8 mg total) by mouth every 8 (eight) hours as needed for nausea.  20 tablet  0  . sitaGLIPtin (JANUVIA) 100 MG tablet Take 1 tablet (100 mg total) by mouth daily.  90 tablet   1  . tiotropium (SPIRIVA HANDIHALER) 18 MCG inhalation capsule Place 1 capsule (18 mcg total) into inhaler and inhale daily.  30 capsule  2  . traZODone (DESYREL) 100 MG tablet TAKE 1 TABLET (100 MG TOTAL) BY MOUTH AT BEDTIME.  90 tablet  1   No current facility-administered medications on file prior to visit.  The PMH, PSH, Social History, Family History, Medications, and allergies have been reviewed in [facility], and have been updated if relevant.   Review of Systems See HPI +fatigue    Objective:    BP 100/60  Pulse 62  Temp(Src) 98 F (36.7 C) (Oral)  Ht 5' 7" (1.702 m)  Wt 146 lb 8 oz (66.452 kg)  BMI 22.94 kg/m2   Physical Exam  Gen:  Alert, pleasant, NAD Psych:  Good eye contact, not anxious or depressed appearing      Assessment & Plan:   Diabetes mellitus type 2 with peripheral artery disease - Plan: Hemoglobin A1c  Hypothyroidism - Plan: TSH, T4, Free  Tobacco abuse Return in about 1 week (around 11/29/2013) for medicare wellness visit.

## 2013-11-22 NOTE — Progress Notes (Signed)
Pre visit review using our clinic review tool, if applicable. No additional management support is needed unless otherwise documented below in the visit note. 

## 2013-11-22 NOTE — Assessment & Plan Note (Signed)
>  35 minutes spent in face to face time with patient, >50% spent in counselling or coordination of care. Explained that a1c has been very high but since her FSBS are improved, if a1c ok today, I can manage this.  She is seeing Dr. Rockey Situ as well. Check labs today. Orders Placed This Encounter  Procedures  . Hemoglobin A1c  . TSH  . T4, Free

## 2013-11-23 ENCOUNTER — Telehealth: Payer: Self-pay

## 2013-11-23 NOTE — Telephone Encounter (Signed)
Left detailed msg on VM per HIPAA letting pt know her Rx for Etodolac is ready to be picked up and placed in the front office

## 2013-11-25 ENCOUNTER — Other Ambulatory Visit: Payer: Self-pay | Admitting: Family Medicine

## 2013-11-25 DIAGNOSIS — IMO0001 Reserved for inherently not codable concepts without codable children: Secondary | ICD-10-CM

## 2013-11-25 DIAGNOSIS — E1165 Type 2 diabetes mellitus with hyperglycemia: Principal | ICD-10-CM

## 2013-11-30 ENCOUNTER — Telehealth: Payer: Self-pay

## 2013-11-30 DIAGNOSIS — M25519 Pain in unspecified shoulder: Secondary | ICD-10-CM | POA: Diagnosis not present

## 2013-11-30 DIAGNOSIS — M7500 Adhesive capsulitis of unspecified shoulder: Secondary | ICD-10-CM | POA: Diagnosis not present

## 2013-11-30 NOTE — Telephone Encounter (Signed)
Relevant patient education assigned to patient using Emmi. ° °

## 2013-12-07 DIAGNOSIS — G909 Disorder of the autonomic nervous system, unspecified: Secondary | ICD-10-CM | POA: Diagnosis not present

## 2013-12-07 DIAGNOSIS — F17200 Nicotine dependence, unspecified, uncomplicated: Secondary | ICD-10-CM | POA: Diagnosis not present

## 2013-12-07 DIAGNOSIS — E1149 Type 2 diabetes mellitus with other diabetic neurological complication: Secondary | ICD-10-CM | POA: Diagnosis not present

## 2013-12-21 ENCOUNTER — Ambulatory Visit (INDEPENDENT_AMBULATORY_CARE_PROVIDER_SITE_OTHER): Payer: Medicare Other | Admitting: Pulmonary Disease

## 2013-12-21 ENCOUNTER — Encounter: Payer: Self-pay | Admitting: Pulmonary Disease

## 2013-12-21 VITALS — BP 116/70 | HR 78 | Ht 67.0 in | Wt 148.0 lb

## 2013-12-21 DIAGNOSIS — I2510 Atherosclerotic heart disease of native coronary artery without angina pectoris: Secondary | ICD-10-CM

## 2013-12-21 DIAGNOSIS — J4489 Other specified chronic obstructive pulmonary disease: Secondary | ICD-10-CM

## 2013-12-21 DIAGNOSIS — J449 Chronic obstructive pulmonary disease, unspecified: Secondary | ICD-10-CM

## 2013-12-21 MED ORDER — ALBUTEROL SULFATE HFA 108 (90 BASE) MCG/ACT IN AERS: 2.0000 | INHALATION_SPRAY | Freq: Four times a day (QID) | RESPIRATORY_TRACT | Status: DC | PRN

## 2013-12-21 MED ORDER — BENZONATATE 100 MG PO CAPS: 100.0000 mg | ORAL_CAPSULE | Freq: Four times a day (QID) | ORAL | Status: DC | PRN

## 2013-12-21 MED ORDER — TIOTROPIUM BROMIDE MONOHYDRATE 18 MCG IN CAPS: 18.0000 ug | ORAL_CAPSULE | Freq: Every day | RESPIRATORY_TRACT | Status: DC

## 2013-12-21 NOTE — Patient Instructions (Signed)
You need to try to suppress your cough to allow your larynx (voice box) to heal.  For three days don't talk, laugh, sing, or clear your throat. Do everything you can to suppress the cough during this time. Use hard candies (sugarless Jolly Ranchers) or non-mint or non-menthol containing cough drops during this time to soothe your throat.  Use a cough suppressant (tessalon) around the clock during this time.  After three days, gradually increase the use of your voice and back off on the cough suppressants.  Restart Spiriva daily Restart albuterol as needed for shortness of breath or cough  We will see you back in 6 months or sooner if needed

## 2013-12-21 NOTE — Assessment & Plan Note (Signed)
This is been a stable interval for [facility]. She needs to resume Spiriva daily.  She has moderate COPD and has not had an exacerbation since her last visit which is a good thing. If she needs surgery she would be low risk of a perioperative pulmonary complication.  Plan: -Resume Spiriva -Resume albuterol -If she needs surgery then she needs to get out of bed as soon as possible, use incentive spirometry, and continue using her Spiriva on a daily basis -Flu shot in the fall -Followup 6 months

## 2013-12-21 NOTE — Progress Notes (Signed)
Subjective:    Patient ID: Sarah Payne, female    DOB: 1946-04-24, 68 y.o.   MRN: [id number]  Synopsis: GOLD GRADE B COPD 03/29/2013 simple spirometry > ratio 65% FEV1 1.54L (60% pred   HPI  12/21/2013 ROV > [name] has been doing okay since the last visit. She stop taking the Spriva on a regular basis and has not been using albuterol very often recently. Unfortunately she may need to have shoulder surgery so she is here today to see me for perioperative risk stratification. She says that lately she has not had much trouble with shortness of breath. She has had a dry cough which is been associated with an itching sensation in her throat. She denies postnasal drip or acid reflux.  Past Medical History  Diagnosis Date  . Diabetes mellitus   . Hypertension   . Hypothyroidism   . Depression   . Vascular dementia   . Tobacco abuse     a. 45 pack year history - quit 09/16/11  . Chronic low back pain   . Coronary artery disease     a. s/p PCI/DES LCX x 3;  b. NSTEMI 09/16/11;  c.   09/17/11 Cath [facility]) Severe RCA/LCX dzs - RCA treated, LCX intervention pending w/ Dr. Clayborn Bigness     Review of Systems  Constitutional: Negative for fever and unexpected weight change.  HENT: Positive for congestion and postnasal drip. Negative for dental problem, ear pain, nosebleeds, rhinorrhea, sinus pressure, sneezing, sore throat and trouble swallowing.   Eyes: Negative for redness and itching.  Respiratory: Positive for cough and shortness of breath. Negative for chest tightness and wheezing.   Cardiovascular: Negative for palpitations and leg swelling.  Gastrointestinal: Negative for nausea and vomiting.  Genitourinary: Negative for dysuria.  Musculoskeletal: Negative for joint swelling.  Skin: Negative for rash.  Neurological: Negative for headaches.  Hematological: Does not bruise/bleed easily.  Psychiatric/Behavioral: Negative for dysphoric mood. The patient is not nervous/anxious.        Objective:    Physical Exam Filed Vitals:   12/21/13 1431  BP: 116/70  Pulse: 78  Height: 5' 7"  (1.702 m)  Weight: 148 lb (67.132 kg)  SpO2: 97%    Gen: well appearing, no acute distress HEENT: NCAT, EOMi, OP clear, PULM: CTA B CV: RRR, no mgr, no JVD AB: BS+, soft, nontender, no hsm Ext: warm, no edema, no clubbing, no cyanosis     Assessment & Plan:   COPD, moderate This is been a stable interval for [place]. She needs to resume Spiriva daily.  She has moderate COPD and has not had an exacerbation since her last visit which is a good thing. If she needs surgery she would be low risk of a perioperative pulmonary complication.  Plan: -Resume Spiriva -Resume albuterol -If she needs surgery then she needs to get out of bed as soon as possible, use incentive spirometry, and continue using her Spiriva on a daily basis -Flu shot in the fall -Followup 6 months    Updated Medication List Outpatient Encounter Prescriptions as of 12/21/2013  Medication Sig  . albuterol (PROVENTIL) (2.5 MG/3ML) 0.083% nebulizer solution Take 3 mLs (2.5 mg total) by nebulization every 6 (six) hours as needed for wheezing.  Marland Kitchen aspirin 81 MG tablet Take 81 mg by mouth daily.    Marland Kitchen atorvastatin (LIPITOR) 40 MG tablet Take 1 tablet (40 mg total) by mouth daily.  . Calcium & Magnesium Carbonates (MYLANTA PO) Take by mouth.    Marland Kitchen  carvedilol (COREG) 6.25 MG tablet Take 1 tablet (6.25 mg total) by mouth 2 (two) times daily.  . clobetasol ointment (TEMOVATE) 0.05 % APPLY EVERY NIGHT TO VAGINAL FOLDS  . clopidogrel (PLAVIX) 75 MG tablet TAKE 1 TABLET (75 MG TOTAL) BY MOUTH DAILY.  . cyanocobalamin (,VITAMIN B-12,) 1000 MCG/ML injection every 30 (thirty) days. INJECT 1 ML (1,000 MCG TOTAL) INTO THE MUSCLE ONCE.  Marland Kitchen donepezil (ARICEPT) 5 MG tablet Take 1 tablet (5 mg total) by mouth at bedtime.  . DULoxetine (CYMBALTA) 60 MG capsule TAKE 1 CAPSULE BY MOUTH DAILY.  Marland Kitchen enalapril (VASOTEC) 10 MG tablet Take 1 tablet (10 mg total)  by mouth daily.  Marland Kitchen etodolac (LODINE) 400 MG tablet Take 1 tablet (400 mg total) by mouth 2 (two) times daily.  . fluticasone (FLOVENT HFA) 220 MCG/ACT inhaler Inhale 1 puff into the lungs 2 (two) times daily.  Marland Kitchen gabapentin (NEURONTIN) 100 MG capsule Take 1 capsule (100 mg total) by mouth 3 (three) times daily.  Marland Kitchen glipiZIDE (GLUCOTROL) 10 MG tablet Before breakfast and supper (evening meal)  . glucose blood (FREESTYLE LITE) test strip Check blood sugar 3 times daily to check blood sugars - Uncontrolled diabetes mellitus  . glucose monitoring kit (FREESTYLE) monitoring kit 1 each by Does not apply route as needed for other. PHARMACY PLEASE SUBSTITUTE WHATEVER GLUCOMETER YOU CARRY  . isosorbide mononitrate (IMDUR) 30 MG 24 hr tablet Take 1 tablet (30 mg total) by mouth daily.  . Levothyroxine Sodium 137 MCG CAPS Take 1 capsule (137 mcg total) by mouth daily before breakfast.  . magnesium oxide (MAG-OX) 400 MG tablet Take 1 tablet (400 mg total) by mouth daily.  . metFORMIN (GLUCOPHAGE) 500 MG tablet Take 2 tablets (1,000 mg total) by mouth 2 (two) times daily with a meal.  . nitroGLYCERIN (NITROSTAT) 0.4 MG SL tablet Place 1 tablet (0.4 mg total) under the tongue every 5 (five) minutes as needed.  . ondansetron (ZOFRAN-ODT) 8 MG disintegrating tablet Take 1 tablet (8 mg total) by mouth every 8 (eight) hours as needed for nausea.  . sitaGLIPtin (JANUVIA) 100 MG tablet Take 1 tablet (100 mg total) by mouth daily.  Marland Kitchen tiotropium (SPIRIVA HANDIHALER) 18 MCG inhalation capsule Place 1 capsule (18 mcg total) into inhaler and inhale daily.  . traZODone (DESYREL) 100 MG tablet TAKE 1 TABLET (100 MG TOTAL) BY MOUTH AT BEDTIME.  . [DISCONTINUED] cyanocobalamin (,VITAMIN B-12,) 1000 MCG/ML injection INJECT 1 ML (1,000 MCG TOTAL) INTO THE MUSCLE ONCE.

## 2013-12-22 ENCOUNTER — Other Ambulatory Visit: Payer: Self-pay

## 2013-12-22 MED ORDER — ALBUTEROL SULFATE HFA 108 (90 BASE) MCG/ACT IN AERS: 2.0000 | INHALATION_SPRAY | Freq: Four times a day (QID) | RESPIRATORY_TRACT | Status: DC | PRN

## 2013-12-22 NOTE — Progress Notes (Signed)
Called [facility] Their e-scribe capabilities are down.  After waiting on hold for over 20 minutes, I printed the rx and faxed it to the direct fax number.  Nothing further needed.

## 2014-01-09 ENCOUNTER — Ambulatory Visit: Payer: Medicare Other | Admitting: Cardiovascular Disease

## 2014-01-18 DIAGNOSIS — R059 Cough, unspecified: Secondary | ICD-10-CM | POA: Diagnosis not present

## 2014-01-18 DIAGNOSIS — J019 Acute sinusitis, unspecified: Secondary | ICD-10-CM | POA: Diagnosis not present

## 2014-01-18 DIAGNOSIS — R05 Cough: Secondary | ICD-10-CM | POA: Diagnosis not present

## 2014-01-18 DIAGNOSIS — E119 Type 2 diabetes mellitus without complications: Secondary | ICD-10-CM | POA: Diagnosis not present

## 2014-01-25 ENCOUNTER — Ambulatory Visit (INDEPENDENT_AMBULATORY_CARE_PROVIDER_SITE_OTHER): Payer: Medicare Other | Admitting: Cardiovascular Disease

## 2014-01-25 ENCOUNTER — Telehealth: Payer: Self-pay

## 2014-01-25 ENCOUNTER — Encounter: Payer: Self-pay | Admitting: Cardiovascular Disease

## 2014-01-25 VITALS — BP 110/70 | HR 84 | Ht 66.0 in | Wt 145.0 lb

## 2014-01-25 DIAGNOSIS — I2510 Atherosclerotic heart disease of native coronary artery without angina pectoris: Secondary | ICD-10-CM | POA: Diagnosis not present

## 2014-01-25 DIAGNOSIS — R079 Chest pain, unspecified: Secondary | ICD-10-CM

## 2014-01-25 DIAGNOSIS — E1159 Type 2 diabetes mellitus with other circulatory complications: Secondary | ICD-10-CM

## 2014-01-25 DIAGNOSIS — F329 Major depressive disorder, single episode, unspecified: Secondary | ICD-10-CM | POA: Diagnosis not present

## 2014-01-25 DIAGNOSIS — J449 Chronic obstructive pulmonary disease, unspecified: Secondary | ICD-10-CM | POA: Diagnosis not present

## 2014-01-25 DIAGNOSIS — E785 Hyperlipidemia, unspecified: Secondary | ICD-10-CM

## 2014-01-25 DIAGNOSIS — F3289 Other specified depressive episodes: Secondary | ICD-10-CM

## 2014-01-25 DIAGNOSIS — Z720 Tobacco use: Secondary | ICD-10-CM

## 2014-01-25 DIAGNOSIS — E038 Other specified hypothyroidism: Secondary | ICD-10-CM

## 2014-01-25 DIAGNOSIS — F32A Depression, unspecified: Secondary | ICD-10-CM

## 2014-01-25 DIAGNOSIS — F17200 Nicotine dependence, unspecified, uncomplicated: Secondary | ICD-10-CM | POA: Diagnosis not present

## 2014-01-25 DIAGNOSIS — I798 Other disorders of arteries, arterioles and capillaries in diseases classified elsewhere: Secondary | ICD-10-CM

## 2014-01-25 DIAGNOSIS — E1151 Type 2 diabetes mellitus with diabetic peripheral angiopathy without gangrene: Secondary | ICD-10-CM

## 2014-01-25 MED ORDER — LEVOFLOXACIN 500 MG PO TABS: 500.0000 mg | ORAL_TABLET | Freq: Every day | ORAL | Status: DC

## 2014-01-25 NOTE — Assessment & Plan Note (Signed)
She reports that she is depressed on today's visit. She also reports poor sleep. Suggested she increase her trazodone up to 150, even 200 mg in the evenings for sleep

## 2014-01-25 NOTE — Assessment & Plan Note (Signed)
Recommended continued aggressive management of her diabetes. Cholesterol climbed last August 2014 with poorly controlled diabetes. We'll continue her on her statin

## 2014-01-25 NOTE — Assessment & Plan Note (Signed)
We have encouraged continued exercise, careful diet management in an effort to lose weight. Most recent hemoglobin A1c of 8

## 2014-01-25 NOTE — Progress Notes (Signed)
Patient ID: Sarah Payne, female    DOB: Mar 19, 1946, 68 y.o.   MRN: [id number]  HPI Comments: 68 year old woman with a long history of smoking, coronary artery disease, prior stent x3 placed to the mid left circumflex with moderate to severe LAD disease , stent placed to her distal RCA in February 2013, stent to the mid left circumflex in March 2013 , poorly controlled diabetes with hemoglobin A1c of 10, depression, hypertension, cardiac catheterization 08/19/2012 showing moderate mid LAD disease at the takeoff of the diagonal vessel, severe ostial to mid RCA disease, ejection fraction greater than 55%. She had FFR pressure wire showing severe disease of the RCA with drug-eluting stent x2 placed.  In followup today, she reports that she has had upper respiratory infection with sinus pressure for the past 3 weeks. She went to urgent care several weeks ago and was given amoxicillin. She states that this did not help her symptoms and she is miserable. Today she has sinus pressure, sinus and chest congestion, severe coughing at nighttime with sputum. Her sleep has been poor. She is requesting further help. No significant chest pain or shortness of breath with exertion. No anginal symptoms.  She also states that she has been very depressed. Prior hemoglobin A1c last September 2014 with 12.6, followup lab work showing improvement down to 10, and 8. She is followed by endocrine at [facility].  Previously ran out of her medications January 2015 and was unable to get refills. She did not feel well, was very sleepy and presented to the hospital. Was evaluated by cardiology on 08/19/2013. It was felt that her atypical chest pain symptoms was secondary to being off her medications. TSH was 44. As recommended that she stop smoking. Hemoglobin A1c at that time was 7.3. It was felt that she had mild dementia and home health referral was made by primary care physician. In followup today, she reports that she continues to  be tired. She is requesting refills of her cholesterol medication, Lipitor and nitroglycerin. She was discharged on 08/20/2013  Lab work from August 2014 shows total cholesterol 212, HDL 37, triglycerides 572, LDL 111. Sugar has improved since that time  Echocardiogram February 07, 2011 shows normal LV systolic function, diastolic dysfunction, moderate inferior wall hypokinesis, normal right ventricular systolic pressures.  EKG shows normal sinus rhythm with rate 84 beats per minute with T-wave abnormality in leads  one and aVL   Outpatient Encounter Prescriptions as of 01/25/2014  Medication Sig  . albuterol (PROVENTIL HFA;VENTOLIN HFA) 108 (90 BASE) MCG/ACT inhaler Inhale 2 puffs into the lungs every 6 (six) hours as needed for wheezing.  Marland Kitchen aspirin 81 MG tablet Take 81 mg by mouth daily.    Marland Kitchen atorvastatin (LIPITOR) 40 MG tablet Take 1 tablet (40 mg total) by mouth daily.  . benzonatate (TESSALON) 100 MG capsule Take 1 capsule (100 mg total) by mouth every 6 (six) hours as needed for cough.  . Calcium & Magnesium Carbonates (MYLANTA PO) Take by mouth.    . carvedilol (COREG) 6.25 MG tablet Take 1 tablet (6.25 mg total) by mouth 2 (two) times daily.  . clobetasol ointment (TEMOVATE) 0.05 % APPLY EVERY NIGHT TO VAGINAL FOLDS  . clopidogrel (PLAVIX) 75 MG tablet TAKE 1 TABLET (75 MG TOTAL) BY MOUTH DAILY.  . cyanocobalamin (,VITAMIN B-12,) 1000 MCG/ML injection every 30 (thirty) days. INJECT 1 ML (1,000 MCG TOTAL) INTO THE MUSCLE ONCE.  Marland Kitchen donepezil (ARICEPT) 5 MG tablet Take 1 tablet (5 mg total) by  mouth at bedtime.  . DULoxetine (CYMBALTA) 60 MG capsule TAKE 1 CAPSULE BY MOUTH DAILY.  Marland Kitchen enalapril (VASOTEC) 10 MG tablet Take 1 tablet (10 mg total) by mouth daily.  Marland Kitchen etodolac (LODINE) 400 MG tablet Take 1 tablet (400 mg total) by mouth 2 (two) times daily.  . fluticasone (FLOVENT HFA) 220 MCG/ACT inhaler Inhale 1 puff into the lungs 2 (two) times daily.  Marland Kitchen gabapentin (NEURONTIN) 100 MG capsule Take  1 capsule (100 mg total) by mouth 3 (three) times daily.  Marland Kitchen glipiZIDE (GLUCOTROL) 10 MG tablet Before breakfast and supper (evening meal)  . glucose blood (FREESTYLE LITE) test strip Check blood sugar 3 times daily to check blood sugars - Uncontrolled diabetes mellitus  . glucose monitoring kit (FREESTYLE) monitoring kit 1 each by Does not apply route as needed for other. PHARMACY PLEASE SUBSTITUTE WHATEVER GLUCOMETER YOU CARRY  . isosorbide mononitrate (IMDUR) 30 MG 24 hr tablet Take 1 tablet (30 mg total) by mouth daily.  . Levothyroxine Sodium 137 MCG CAPS Take 1 capsule (137 mcg total) by mouth daily before breakfast.  . magnesium oxide (MAG-OX) 400 MG tablet Take 1 tablet (400 mg total) by mouth daily.  . metFORMIN (GLUCOPHAGE) 500 MG tablet Take 2 tablets (1,000 mg total) by mouth 2 (two) times daily with a meal.  . nitroGLYCERIN (NITROSTAT) 0.4 MG SL tablet Place 1 tablet (0.4 mg total) under the tongue every 5 (five) minutes as needed.  . ondansetron (ZOFRAN-ODT) 8 MG disintegrating tablet Take 1 tablet (8 mg total) by mouth every 8 (eight) hours as needed for nausea.  . sitaGLIPtin (JANUVIA) 100 MG tablet Take 1 tablet (100 mg total) by mouth daily.  Marland Kitchen tiotropium (SPIRIVA HANDIHALER) 18 MCG inhalation capsule Place 1 capsule (18 mcg total) into inhaler and inhale daily.  . traZODone (DESYREL) 100 MG tablet TAKE 1 TABLET (100 MG TOTAL) BY MOUTH AT BEDTIME.    Review of Systems  Constitutional: Positive for fatigue.  HENT: Positive for congestion and sinus pressure.   Eyes: Negative.   Respiratory: Negative.   Cardiovascular: Negative.   Gastrointestinal: Negative.   Endocrine: Negative.   Musculoskeletal: Negative.   Skin: Negative.   Allergic/Immunologic: Negative.   Neurological: Negative.   Hematological: Negative.   Psychiatric/Behavioral: Negative.   All other systems reviewed and are negative.  BP 110/70  Pulse 84  Ht 5' 6"  (1.676 m)  Wt 145 lb (65.772 kg)  BMI 23.41  kg/m2  Physical Exam  Nursing note and vitals reviewed. Constitutional: She is oriented to person, place, and time. She appears well-developed and well-nourished.  HENT:  Head: Normocephalic.  Nose: Nose normal.  Mouth/Throat: Oropharynx is clear and moist.  Eyes: Conjunctivae are normal. Pupils are equal, round, and reactive to light.  Neck: Normal range of motion. Neck supple. No JVD present.  Cardiovascular: Normal rate, regular rhythm, S1 normal, S2 normal, normal heart sounds and intact distal pulses.  Exam reveals no gallop and no friction rub.   No murmur heard. Pulmonary/Chest: Effort normal and breath sounds normal. No respiratory distress. She has no wheezes. She has no rales. She exhibits no tenderness.  Abdominal: Soft. Bowel sounds are normal. She exhibits no distension. There is no tenderness.  Musculoskeletal: Normal range of motion. She exhibits no edema and no tenderness.  Lymphadenopathy:    She has no cervical adenopathy.  Neurological: She is alert and oriented to person, place, and time. Coordination normal.  Skin: Skin is warm and dry. No rash noted. No  erythema.  Psychiatric: She has a normal mood and affect. Her behavior is normal. Judgment and thought content normal.    Assessment and Plan

## 2014-01-25 NOTE — Patient Instructions (Signed)
You are doing well.  Please start levaquin once a day for 10 days If symptoms do not improve after antibiotics are complete,  Call the office  Try extra 1/2 or whole trazodone for sleep (150 mg up to 200 mg)  Please call us if you have new issues that need to be addressed before your next appt.  Your physician wants you to follow-up in: 6 months.  You will receive a reminder letter in the mail two months in advance. If you don't receive a letter, please call our office to schedule the follow-up appointment

## 2014-01-25 NOTE — Assessment & Plan Note (Signed)
We have encouraged her to continue to work on weaning her cigarettes and smoking cessation. She will continue to work on this and does not want any assistance with chantix.  

## 2014-01-25 NOTE — Assessment & Plan Note (Signed)
She presents today with chest congestion, sinus congestion, URI symptoms. She did not respond to amoxicillin. We have given her a prescription for Levaquin

## 2014-01-25 NOTE — Assessment & Plan Note (Signed)
She reports that she has followup with Dr. Gabriel Carina of endocrinology

## 2014-01-25 NOTE — Telephone Encounter (Signed)
Pt needs surgical clearance from today's visit. Please call.

## 2014-01-26 DIAGNOSIS — M25519 Pain in unspecified shoulder: Secondary | ICD-10-CM | POA: Diagnosis not present

## 2014-02-02 ENCOUNTER — Telehealth: Payer: Self-pay

## 2014-02-02 NOTE — Telephone Encounter (Signed)
Faxed cardiac clearance to [facility] for pt to proceed w/ left shoulder scope to [facility] at [phone]. Per Christell Faith, PA, "Cleared from a cardiac standpoint only.  Had URI symptoms in office on 01/25/14.  If MD wants to stop Plavix for procedure, may do so 5 days prior, then restart.  Prefer to keep 81mg  aspirin on board."

## 2014-02-18 DIAGNOSIS — M25519 Pain in unspecified shoulder: Secondary | ICD-10-CM | POA: Diagnosis not present

## 2014-02-21 ENCOUNTER — Encounter: Payer: Medicare Other | Admitting: Family Medicine

## 2014-02-21 DIAGNOSIS — Z0289 Encounter for other administrative examinations: Secondary | ICD-10-CM

## 2014-02-27 ENCOUNTER — Telehealth: Payer: Self-pay | Admitting: Family Medicine

## 2014-02-27 NOTE — Telephone Encounter (Signed)
Lm on pts vm requesting a call back 

## 2014-02-27 NOTE — Telephone Encounter (Signed)
Pt was cleared for shoulder surgery by Dr. Karl Bales and Dr. Lake Bells. [facility] is stating they need clearance from her PCP before they will schedule her surgery. Pt needs this done asap. Please advise.  [phone]

## 2014-02-28 NOTE — Telephone Encounter (Signed)
Attempted to contact pt; vm did not pickup to leave message 

## 2014-02-28 NOTE — Telephone Encounter (Signed)
Spoke to pts daughter [name]. Surgical clearance appt sched for 03/10/14-first avail

## 2014-03-10 ENCOUNTER — Ambulatory Visit (INDEPENDENT_AMBULATORY_CARE_PROVIDER_SITE_OTHER): Payer: Medicare Other | Admitting: Family Medicine

## 2014-03-10 ENCOUNTER — Encounter: Payer: Self-pay | Admitting: Family Medicine

## 2014-03-10 VITALS — BP 108/76 | HR 65 | Temp 98.5°F | Ht 65.25 in | Wt 138.8 lb

## 2014-03-10 DIAGNOSIS — R634 Abnormal weight loss: Secondary | ICD-10-CM

## 2014-03-10 DIAGNOSIS — Z01818 Encounter for other preprocedural examination: Secondary | ICD-10-CM

## 2014-03-10 DIAGNOSIS — Z720 Tobacco use: Secondary | ICD-10-CM

## 2014-03-10 DIAGNOSIS — F3289 Other specified depressive episodes: Secondary | ICD-10-CM

## 2014-03-10 DIAGNOSIS — I2510 Atherosclerotic heart disease of native coronary artery without angina pectoris: Secondary | ICD-10-CM

## 2014-03-10 DIAGNOSIS — E1159 Type 2 diabetes mellitus with other circulatory complications: Secondary | ICD-10-CM

## 2014-03-10 DIAGNOSIS — F17200 Nicotine dependence, unspecified, uncomplicated: Secondary | ICD-10-CM | POA: Diagnosis not present

## 2014-03-10 DIAGNOSIS — F329 Major depressive disorder, single episode, unspecified: Secondary | ICD-10-CM

## 2014-03-10 DIAGNOSIS — F32A Depression, unspecified: Secondary | ICD-10-CM

## 2014-03-10 DIAGNOSIS — J449 Chronic obstructive pulmonary disease, unspecified: Secondary | ICD-10-CM

## 2014-03-10 DIAGNOSIS — E1151 Type 2 diabetes mellitus with diabetic peripheral angiopathy without gangrene: Secondary | ICD-10-CM

## 2014-03-10 DIAGNOSIS — R197 Diarrhea, unspecified: Secondary | ICD-10-CM | POA: Insufficient documentation

## 2014-03-10 DIAGNOSIS — F4321 Adjustment disorder with depressed mood: Secondary | ICD-10-CM | POA: Insufficient documentation

## 2014-03-10 DIAGNOSIS — E785 Hyperlipidemia, unspecified: Secondary | ICD-10-CM

## 2014-03-10 DIAGNOSIS — I798 Other disorders of arteries, arterioles and capillaries in diseases classified elsewhere: Secondary | ICD-10-CM

## 2014-03-10 MED ORDER — CLONAZEPAM 0.5 MG PO TABS: 0.5000 mg | ORAL_TABLET | Freq: Every evening | ORAL | Status: DC | PRN

## 2014-03-10 NOTE — Assessment & Plan Note (Signed)
Improved- she is cutting back. Urged her to try to quit prior to surgery. The patient indicates understanding of these issues and agrees with the plan.

## 2014-03-10 NOTE — Progress Notes (Signed)
Pre visit review using our clinic review tool, if applicable. No additional management support is needed unless otherwise documented below in the visit note. 

## 2014-03-10 NOTE — Assessment & Plan Note (Signed)
Per pt, improved, likely due to decreased appetite and weight loss. Encouraged her to call Dr. Gabriel Carina to make a follow up appt.

## 2014-03-10 NOTE — Assessment & Plan Note (Signed)
Cleared from cardiac and pulm standpoint. Low risk for surgery.  Discussed that she was advised to hold Plavix 5 days prior to surgery. She may also hold Metformin day of surgery as well. The patient indicates understanding of these issues and agrees with the plan.

## 2014-03-10 NOTE — Patient Instructions (Signed)
Great to see you. Hang in there.  I am so sorry for your recent loses.  Use Klonopin only when you absolutely need it to fall back asleep.  This is habit forming and will make you sleepy.

## 2014-03-10 NOTE — Assessment & Plan Note (Signed)
Deteriorated due to acute grief. She does not want to change antidepressants- feels she is coping ok, just "Sad"   She is having more anxiety and difficulty staying asleep- Given rx for klonopin to use prn insomnia prn qhs. She is aware of sedation and addiction potential- short term use only. The patient indicates understanding of these issues and agrees with the plan.

## 2014-03-10 NOTE — Assessment & Plan Note (Signed)
New- IBS vs gastroparesis. Advised follow up after her surgery- may benefit from Reglan.

## 2014-03-10 NOTE — Progress Notes (Signed)
 Subjective:   Patient ID: Sarah Payne, female    DOB: 09/02/1945, 68 y.o.   MRN: [id number]  Sarah Payne is a pleasant 68 y.o. year old female who presents to clinic today with surgical clearance and Anorexia  on 03/10/2014  HPI:  Plans on having left shoulder scope by Dr. Murphy for frozen shoulder. Has never had issues with anesthesia in past. Has cut back to 1/2 ppd tobacco.  H/o CAD- followed by Dr. Gollan.  Most recently saw him on 01/25/2014- note reviewed. 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   According to telephone note from 02/02/2014- cardiac clearance was faxed from Dr. Gollan to [facility] stating that she was "cleared from cardiac standpoint."  Advised to stop plavix 5 days prior to procedure then restart.  COPD, moderate- followed by Dr. McQuaid.  Per note from 6/3/2-15- he has cleared her for surgery stating that she would be low risk of perioperative pulmonary complication.  DM- sees Dr. Sollum.  Last note we have from her is 12/07/13-  Was supposed to have appointment in August but she thinks she missed it.  Taking Metformin 500 mg twice daily. Checks her FSBS daily- usually 80-90s fasting.  She feels that her diabetes is "better than ever" because of loss of appetite for past 2 months.  Lab Results  Component Value Date   CHOL 212* 02/25/2013   HDL 37.40* 02/25/2013   LDLCALC 70 04/30/2011   LDLDIRECT 111.0 02/25/2013   TRIG 572.0* 02/25/2013   CHOLHDL 6 02/25/2013     Loss of appetite/weight loss-past two months, going through a lot of stressors.  Brother died of cancer and she was taking care of him until his death. Step mother died as well. She states she does not feel depressed, just sad.  Lost of appetite and not sleeping well.  Increased Trazodone has not helped much- still wakes up 3 or four times a night. Denies any SI. Wt Readings from Last 3 Encounters:  03/10/14 138 lb 12  oz (62.937 kg)  01/25/14 145 lb (65.772 kg)  12/21/13 148 lb (67.132 kg)    Lab Results  Component Value Date   HGBA1C 8.0* 11/22/2013   Current Outpatient Prescriptions on File Prior to Visit  Medication Sig Dispense Refill  . albuterol (PROVENTIL HFA;VENTOLIN HFA) 108 (90 BASE) MCG/ACT inhaler Inhale 2 puffs into the lungs every 6 (six) hours as needed for wheezing.  1 Inhaler  11  . aspirin 81 MG tablet Take 81 mg by mouth daily.        . atorvastatin (LIPITOR) 40 MG tablet Take 1 tablet (40 mg total) by mouth daily.  90 tablet  3  . Calcium & Magnesium Carbonates (MYLANTA PO) Take by mouth.        . carvedilol (COREG) 6.25 MG tablet Take 1 tablet (6.25 mg total) by mouth 2 (two) times daily.  14 tablet  0  . clobetasol ointment (TEMOVATE) 0.05 % APPLY EVERY NIGHT TO VAGINAL FOLDS  60 g  1  . clopidogrel (PLAVIX) 75 MG tablet TAKE 1 TABLET (75 MG TOTAL) BY MOUTH DAILY.  7 tablet  0  . cyanocobalamin (,VITAMIN B-12,) 1000 MCG/ML injection every 30 (thirty) days. INJECT 1 ML (1,000 MCG TOTAL) INTO THE MUSCLE ONCE.      . donepezil (ARICEPT) 5 MG tablet Take 1 tablet (5 mg total) by mouth at bedtime.  90 tablet  1  .   DULoxetine (CYMBALTA) 60 MG capsule TAKE 1 CAPSULE BY MOUTH DAILY.  90 capsule  1  . enalapril (VASOTEC) 10 MG tablet Take 1 tablet (10 mg total) by mouth daily.  90 tablet  1  . etodolac (LODINE) 400 MG tablet Take 1 tablet (400 mg total) by mouth 2 (two) times daily.  180 tablet  1  . fluticasone (FLOVENT HFA) 220 MCG/ACT inhaler Inhale 1 puff into the lungs 2 (two) times daily.  2 Inhaler  1  . gabapentin (NEURONTIN) 100 MG capsule Take 1 capsule (100 mg total) by mouth 3 (three) times daily.  270 capsule  1  . glipiZIDE (GLUCOTROL) 10 MG tablet Before breakfast and supper (evening meal)  180 tablet  1  . glucose blood (FREESTYLE LITE) test strip Check blood sugar 3 times daily to check blood sugars - Uncontrolled diabetes mellitus  300 each  3  . glucose monitoring kit  (FREESTYLE) monitoring kit 1 each by Does not apply route as needed for other. PHARMACY PLEASE SUBSTITUTE WHATEVER GLUCOMETER YOU CARRY  1 each  1  . isosorbide mononitrate (IMDUR) 30 MG 24 hr tablet Take 1 tablet (30 mg total) by mouth daily.  7 tablet  0  . Levothyroxine Sodium 137 MCG CAPS Take 1 capsule (137 mcg total) by mouth daily before breakfast.  7 capsule  0  . magnesium oxide (MAG-OX) 400 MG tablet Take 1 tablet (400 mg total) by mouth daily.  90 tablet  1  . metFORMIN (GLUCOPHAGE) 500 MG tablet Take 2 tablets (1,000 mg total) by mouth 2 (two) times daily with a meal.  180 tablet  1  . nitroGLYCERIN (NITROSTAT) 0.4 MG SL tablet Place 1 tablet (0.4 mg total) under the tongue every 5 (five) minutes as needed.  30 tablet  1  . ondansetron (ZOFRAN-ODT) 8 MG disintegrating tablet Take 1 tablet (8 mg total) by mouth every 8 (eight) hours as needed for nausea.  20 tablet  0  . sitaGLIPtin (JANUVIA) 100 MG tablet Take 1 tablet (100 mg total) by mouth daily.  90 tablet  1  . tiotropium (SPIRIVA HANDIHALER) 18 MCG inhalation capsule Place 1 capsule (18 mcg total) into inhaler and inhale daily.  30 capsule  2  . traZODone (DESYREL) 100 MG tablet TAKE 1 TABLET (100 MG TOTAL) BY MOUTH AT BEDTIME.  90 tablet  1   No current facility-administered medications on file prior to visit.    Allergies  Allergen Reactions  . No Known Allergies     Past Medical History  Diagnosis Date  . Diabetes mellitus   . Hypertension   . Hypothyroidism   . Depression   . Vascular dementia   . Tobacco abuse     a. 45 pack year history - quit 09/16/11  . Chronic low back pain   . Coronary artery disease     a. s/p PCI/DES LCX x 3;  b. NSTEMI 09/16/11;  c.   09/17/11 Cath ([facility]) Severe RCA/LCX dzs - RCA treated, LCX intervention pending w/ Dr. Callwood    Past Surgical History  Procedure Laterality Date  . Back surgery    . Abdominal hysterectomy    . Appendectomy    . Cardiac catheterization  2013  .  Coronary angioplasty  2012    stent x 3   . Coronary angioplasty with stent placement  2013    Family History  Problem Relation Age of Onset  . Heart attack Mother     First MI @   107 - Died @ 73  . Heart disease Mother   . Heart disease Father     Died @ 6  . Throat cancer Brother     History   Social History  . Marital Status: Married    Spouse Name: N/A    Number of Children: N/A  . Years of Education: N/A   Occupational History  . Not on file.   Social History Main Topics  . Smoking status: Current Every Day Smoker -- 0.25 packs/day for 45 years    Types: Cigarettes  . Smokeless tobacco: Never Used     Comment: Has cut back, trying to quit.   . Alcohol Use: No  . Drug Use: Yes     Comment: marj  . Sexual Activity: Not on file   Other Topics Concern  . Not on file   Social History Narrative   Lives at home with her husband in [place].  Previously used marijuana - quit.      Regular exercise: no/ pain from a frozen rotator cuff   Caffeine use: coffee daily and pepsi   The PMH, PSH, Social History, Family History, Medications, and allergies have been reviewed in [facility], and have been updated if relevant.  Review of Systems See HPI Denies CP or SOB No nausea +loss of appetite +diarrhea- "everything I eat runs through me" No blood in stool No SI or HI No LE edema No recent cough or fever    Objective:    BP 108/76  Pulse 65  Temp(Src) 98.5 F (36.9 C) (Oral)  Ht 5' 5.25" (1.657 m)  Wt 138 lb 12 oz (62.937 kg)  BMI 22.92 kg/m2  SpO2 99%   Physical Exam  Nursing note and vitals reviewed. Constitutional: She is oriented to person, place, and time. She appears well-developed and well-nourished. No distress.  HENT:  Head: Normocephalic and atraumatic.  Eyes: Pupils are equal, round, and reactive to light.  Neck: Normal range of motion.  Cardiovascular: Normal rate and regular rhythm.   Pulmonary/Chest: Effort normal and breath sounds normal. No  respiratory distress. She has no wheezes.  Abdominal: Soft.  Musculoskeletal: Normal range of motion.  Neurological: She is alert and oriented to person, place, and time.  Skin: Skin is warm and dry.  Psychiatric:  Tearful but appropriate          Assessment & Plan:   Pre-operative clearance  Tobacco abuse  Diabetes mellitus type 2 with peripheral artery disease  Grief No Follow-up on file.

## 2014-03-10 NOTE — Assessment & Plan Note (Signed)
Chronic, followed by pulm. Cleared from pulm for surgery- see note.

## 2014-03-10 NOTE — Assessment & Plan Note (Signed)
New- likely due to acute grief but cannot rule out gastroparesis. See below.

## 2014-03-13 ENCOUNTER — Encounter (HOSPITAL_BASED_OUTPATIENT_CLINIC_OR_DEPARTMENT_OTHER): Payer: Self-pay | Admitting: *Deleted

## 2014-03-13 NOTE — Progress Notes (Signed)
Has seen pcp,cardiology and endocrine-cleared for surgery-will bring all meds and overnight bag in case she has to stay-copd-smokes-needs [employer]

## 2014-03-15 ENCOUNTER — Encounter (HOSPITAL_BASED_OUTPATIENT_CLINIC_OR_DEPARTMENT_OTHER): Payer: Medicare Other | Admitting: Certified Registered"

## 2014-03-15 ENCOUNTER — Encounter (HOSPITAL_BASED_OUTPATIENT_CLINIC_OR_DEPARTMENT_OTHER): Payer: Self-pay | Admitting: Physician Assistant

## 2014-03-15 ENCOUNTER — Encounter (HOSPITAL_BASED_OUTPATIENT_CLINIC_OR_DEPARTMENT_OTHER)
Admission: RE | Disposition: A | Discharge status: Home / Self Care | Payer: Self-pay | Source: Ambulatory Visit | Attending: Orthopedic Surgery

## 2014-03-15 ENCOUNTER — Ambulatory Visit (HOSPITAL_BASED_OUTPATIENT_CLINIC_OR_DEPARTMENT_OTHER)
Admission: RE | Admit: 2014-03-15 | Discharge: 2014-03-15 | Disposition: A | Discharge status: Home / Self Care | Payer: Medicare Other | Source: Ambulatory Visit | Attending: Orthopedic Surgery | Admitting: Orthopedic Surgery

## 2014-03-15 ENCOUNTER — Ambulatory Visit (HOSPITAL_BASED_OUTPATIENT_CLINIC_OR_DEPARTMENT_OTHER): Payer: Medicare Other | Admitting: Certified Registered"

## 2014-03-15 DIAGNOSIS — E039 Hypothyroidism, unspecified: Secondary | ICD-10-CM | POA: Diagnosis present

## 2014-03-15 DIAGNOSIS — F329 Major depressive disorder, single episode, unspecified: Secondary | ICD-10-CM | POA: Diagnosis not present

## 2014-03-15 DIAGNOSIS — G8929 Other chronic pain: Secondary | ICD-10-CM | POA: Insufficient documentation

## 2014-03-15 DIAGNOSIS — M25819 Other specified joint disorders, unspecified shoulder: Secondary | ICD-10-CM | POA: Insufficient documentation

## 2014-03-15 DIAGNOSIS — I509 Heart failure, unspecified: Secondary | ICD-10-CM | POA: Insufficient documentation

## 2014-03-15 DIAGNOSIS — Y929 Unspecified place or not applicable: Secondary | ICD-10-CM | POA: Insufficient documentation

## 2014-03-15 DIAGNOSIS — I252 Old myocardial infarction: Secondary | ICD-10-CM | POA: Diagnosis not present

## 2014-03-15 DIAGNOSIS — M24119 Other articular cartilage disorders, unspecified shoulder: Secondary | ICD-10-CM | POA: Diagnosis not present

## 2014-03-15 DIAGNOSIS — I739 Peripheral vascular disease, unspecified: Secondary | ICD-10-CM | POA: Diagnosis not present

## 2014-03-15 DIAGNOSIS — J449 Chronic obstructive pulmonary disease, unspecified: Secondary | ICD-10-CM | POA: Diagnosis not present

## 2014-03-15 DIAGNOSIS — G4730 Sleep apnea, unspecified: Secondary | ICD-10-CM | POA: Insufficient documentation

## 2014-03-15 DIAGNOSIS — Z7982 Long term (current) use of aspirin: Secondary | ICD-10-CM | POA: Diagnosis not present

## 2014-03-15 DIAGNOSIS — I5022 Chronic systolic (congestive) heart failure: Secondary | ICD-10-CM | POA: Diagnosis present

## 2014-03-15 DIAGNOSIS — M75120 Complete rotator cuff tear or rupture of unspecified shoulder, not specified as traumatic: Secondary | ICD-10-CM | POA: Diagnosis not present

## 2014-03-15 DIAGNOSIS — IMO0002 Reserved for concepts with insufficient information to code with codable children: Secondary | ICD-10-CM | POA: Insufficient documentation

## 2014-03-15 DIAGNOSIS — I10 Essential (primary) hypertension: Secondary | ICD-10-CM | POA: Insufficient documentation

## 2014-03-15 DIAGNOSIS — Z79899 Other long term (current) drug therapy: Secondary | ICD-10-CM | POA: Diagnosis not present

## 2014-03-15 DIAGNOSIS — F17200 Nicotine dependence, unspecified, uncomplicated: Secondary | ICD-10-CM | POA: Diagnosis not present

## 2014-03-15 DIAGNOSIS — F32A Depression, unspecified: Secondary | ICD-10-CM | POA: Diagnosis present

## 2014-03-15 DIAGNOSIS — I2510 Atherosclerotic heart disease of native coronary artery without angina pectoris: Secondary | ICD-10-CM | POA: Diagnosis not present

## 2014-03-15 DIAGNOSIS — Z9119 Patient's noncompliance with other medical treatment and regimen: Secondary | ICD-10-CM | POA: Insufficient documentation

## 2014-03-15 DIAGNOSIS — M19019 Primary osteoarthritis, unspecified shoulder: Secondary | ICD-10-CM | POA: Insufficient documentation

## 2014-03-15 DIAGNOSIS — X58XXXA Exposure to other specified factors, initial encounter: Secondary | ICD-10-CM | POA: Diagnosis not present

## 2014-03-15 DIAGNOSIS — Z794 Long term (current) use of insulin: Secondary | ICD-10-CM | POA: Diagnosis present

## 2014-03-15 DIAGNOSIS — J4489 Other specified chronic obstructive pulmonary disease: Secondary | ICD-10-CM | POA: Diagnosis present

## 2014-03-15 DIAGNOSIS — M545 Low back pain, unspecified: Secondary | ICD-10-CM | POA: Insufficient documentation

## 2014-03-15 DIAGNOSIS — G4733 Obstructive sleep apnea (adult) (pediatric): Secondary | ICD-10-CM | POA: Diagnosis present

## 2014-03-15 DIAGNOSIS — M7582 Other shoulder lesions, left shoulder: Secondary | ICD-10-CM | POA: Diagnosis present

## 2014-03-15 DIAGNOSIS — Z9861 Coronary angioplasty status: Secondary | ICD-10-CM | POA: Diagnosis not present

## 2014-03-15 DIAGNOSIS — R29898 Other symptoms and signs involving the musculoskeletal system: Secondary | ICD-10-CM

## 2014-03-15 DIAGNOSIS — E1165 Type 2 diabetes mellitus with hyperglycemia: Secondary | ICD-10-CM | POA: Diagnosis not present

## 2014-03-15 DIAGNOSIS — M7580 Other shoulder lesions, unspecified shoulder: Secondary | ICD-10-CM

## 2014-03-15 DIAGNOSIS — Z91199 Patient's noncompliance with other medical treatment and regimen due to unspecified reason: Secondary | ICD-10-CM | POA: Diagnosis not present

## 2014-03-15 DIAGNOSIS — F3289 Other specified depressive episodes: Secondary | ICD-10-CM | POA: Diagnosis not present

## 2014-03-15 DIAGNOSIS — M7542 Impingement syndrome of left shoulder: Secondary | ICD-10-CM | POA: Diagnosis present

## 2014-03-15 DIAGNOSIS — G8918 Other acute postprocedural pain: Secondary | ICD-10-CM | POA: Diagnosis not present

## 2014-03-15 DIAGNOSIS — E1151 Type 2 diabetes mellitus with diabetic peripheral angiopathy without gangrene: Secondary | ICD-10-CM | POA: Diagnosis present

## 2014-03-15 DIAGNOSIS — E118 Type 2 diabetes mellitus with unspecified complications: Secondary | ICD-10-CM

## 2014-03-15 DIAGNOSIS — S43429A Sprain of unspecified rotator cuff capsule, initial encounter: Secondary | ICD-10-CM | POA: Insufficient documentation

## 2014-03-15 HISTORY — DX: Impingement syndrome of left shoulder: M75.42

## 2014-03-15 HISTORY — DX: Other shoulder lesions, left shoulder: M75.82

## 2014-03-15 HISTORY — DX: Sleep apnea, unspecified: G47.30

## 2014-03-15 LAB — POCT I-STAT, CHEM 8
BUN: 16 mg/dL (ref 6–23)
Calcium, Ion: 1.27 mmol/L (ref 1.13–1.30)
Chloride: 103 mEq/L (ref 96–112)
Creatinine, Ser: 1 mg/dL (ref 0.50–1.10)
Glucose, Bld: 194 mg/dL — ABNORMAL HIGH (ref 70–99)
HCT: 44 % (ref 36.0–46.0)
Hemoglobin: 15 g/dL (ref 12.0–15.0)
Potassium: 3.4 mEq/L — ABNORMAL LOW (ref 3.7–5.3)
Sodium: 138 mEq/L (ref 137–147)
TCO2: 23 mmol/L (ref 0–100)

## 2014-03-15 LAB — GLUCOSE, CAPILLARY: Glucose-Capillary: 224 mg/dL — ABNORMAL HIGH (ref 70–99)

## 2014-03-15 SURGERY — SHOULDER ARTHROSCOPY WITH SUBACROMIAL DECOMPRESSION AND DISTAL CLAVICLE EXCISION
Anesthesia: Regional | Site: Shoulder | Laterality: Left

## 2014-03-15 MED ORDER — DIAZEPAM 2 MG PO TABS: 2.0000 mg | ORAL_TABLET | Freq: Three times a day (TID) | ORAL | Status: DC | PRN

## 2014-03-15 MED ORDER — FENTANYL CITRATE 0.05 MG/ML IJ SOLN
INTRAMUSCULAR | Status: AC
  Filled 2014-03-15: qty 6

## 2014-03-15 MED ORDER — BUPIVACAINE-EPINEPHRINE 0.25% -1:200000 IJ SOLN
INTRAMUSCULAR | Status: DC | PRN
  Administered 2014-03-15: 10 mL

## 2014-03-15 MED ORDER — FENTANYL CITRATE 0.05 MG/ML IJ SOLN
50.0000 ug | INTRAMUSCULAR | Status: DC | PRN
  Administered 2014-03-15: 100 ug via INTRAVENOUS

## 2014-03-15 MED ORDER — METHYLPREDNISOLONE ACETATE 40 MG/ML IJ SUSP
INTRAMUSCULAR | Status: AC
  Filled 2014-03-15: qty 1

## 2014-03-15 MED ORDER — LACTATED RINGERS IV SOLN
INTRAVENOUS | Status: DC
Start: 2014-03-15 — End: 2014-03-15
  Administered 2014-03-15 (×2): via INTRAVENOUS

## 2014-03-15 MED ORDER — SUCCINYLCHOLINE CHLORIDE 20 MG/ML IJ SOLN
INTRAMUSCULAR | Status: AC
  Filled 2014-03-15: qty 1

## 2014-03-15 MED ORDER — OXYCODONE HCL 5 MG/5ML PO SOLN: 5.0000 mg | Freq: Once | ORAL | Status: DC | PRN

## 2014-03-15 MED ORDER — BUPIVACAINE-EPINEPHRINE (PF) 0.25% -1:200000 IJ SOLN
INTRAMUSCULAR | Status: AC
  Filled 2014-03-15: qty 30

## 2014-03-15 MED ORDER — MIDAZOLAM HCL 2 MG/2ML IJ SOLN
INTRAMUSCULAR | Status: AC
  Filled 2014-03-15: qty 2

## 2014-03-15 MED ORDER — ONDANSETRON HCL 4 MG/2ML IJ SOLN: 4.0000 mg | Freq: Four times a day (QID) | INTRAMUSCULAR | Status: DC | PRN

## 2014-03-15 MED ORDER — PROPOFOL 10 MG/ML IV BOLUS
INTRAVENOUS | Status: DC | PRN
  Administered 2014-03-15: 120 mg via INTRAVENOUS

## 2014-03-15 MED ORDER — DEXAMETHASONE SODIUM PHOSPHATE 4 MG/ML IJ SOLN
INTRAMUSCULAR | Status: DC | PRN
  Administered 2014-03-15: 4 mg via INTRAVENOUS

## 2014-03-15 MED ORDER — FENTANYL CITRATE 0.05 MG/ML IJ SOLN
INTRAMUSCULAR | Status: DC | PRN
  Administered 2014-03-15: 50 ug via INTRAVENOUS

## 2014-03-15 MED ORDER — BUPIVACAINE-EPINEPHRINE (PF) 0.5% -1:200000 IJ SOLN
INTRAMUSCULAR | Status: DC | PRN
  Administered 2014-03-15: 30 mL via PERINEURAL

## 2014-03-15 MED ORDER — ONDANSETRON HCL 4 MG/2ML IJ SOLN
INTRAMUSCULAR | Status: DC | PRN
  Administered 2014-03-15: 4 mg via INTRAVENOUS

## 2014-03-15 MED ORDER — SODIUM CHLORIDE 0.9 % IR SOLN
Status: DC | PRN
  Administered 2014-03-15: 3000 mL

## 2014-03-15 MED ORDER — OXYCODONE HCL 5 MG PO TABS: ORAL_TABLET | ORAL | Status: DC

## 2014-03-15 MED ORDER — PHENYLEPHRINE HCL 10 MG/ML IJ SOLN
10.0000 mg | INTRAVENOUS | Status: DC | PRN
  Administered 2014-03-15: 25 ug/min via INTRAVENOUS

## 2014-03-15 MED ORDER — OXYCODONE HCL 5 MG PO TABS: 5.0000 mg | ORAL_TABLET | Freq: Once | ORAL | Status: DC | PRN

## 2014-03-15 MED ORDER — METHYLPREDNISOLONE ACETATE 80 MG/ML IJ SUSP
INTRAMUSCULAR | Status: DC | PRN
  Administered 2014-03-15: 80 mg

## 2014-03-15 MED ORDER — CHLORHEXIDINE GLUCONATE 4 % EX LIQD: 60.0000 mL | Freq: Once | CUTANEOUS | Status: DC

## 2014-03-15 MED ORDER — FENTANYL CITRATE 0.05 MG/ML IJ SOLN
INTRAMUSCULAR | Status: AC
  Filled 2014-03-15: qty 2

## 2014-03-15 MED ORDER — PROPOFOL 10 MG/ML IV EMUL
INTRAVENOUS | Status: AC
  Filled 2014-03-15: qty 50

## 2014-03-15 MED ORDER — MIDAZOLAM HCL 2 MG/2ML IJ SOLN
1.0000 mg | INTRAMUSCULAR | Status: DC | PRN
  Administered 2014-03-15: 2 mg via INTRAVENOUS

## 2014-03-15 MED ORDER — CEFAZOLIN SODIUM-DEXTROSE 2-3 GM-% IV SOLR
2.0000 g | INTRAVENOUS | Status: AC
  Administered 2014-03-15: 2 g via INTRAVENOUS

## 2014-03-15 MED ORDER — HYDROMORPHONE HCL PF 1 MG/ML IJ SOLN: 0.2500 mg | INTRAMUSCULAR | Status: DC | PRN

## 2014-03-15 MED ORDER — SUCCINYLCHOLINE CHLORIDE 20 MG/ML IJ SOLN
INTRAMUSCULAR | Status: DC | PRN
  Administered 2014-03-15: 50 mg via INTRAVENOUS

## 2014-03-15 MED ORDER — LIDOCAINE HCL (CARDIAC) 20 MG/ML IV SOLN
INTRAVENOUS | Status: DC | PRN
  Administered 2014-03-15: 30 mg via INTRAVENOUS

## 2014-03-15 SURGICAL SUPPLY — 73 items
BENZOIN TINCTURE PRP APPL 2/3 (GAUZE/BANDAGES/DRESSINGS) IMPLANT
BLADE CLIPPER SURG (BLADE) IMPLANT
BLADE CUDA 5.5 (BLADE) IMPLANT
BLADE CUTTER GATOR 3.5 (BLADE) ×2 IMPLANT
BLADE GREAT WHITE 4.2 (BLADE) IMPLANT
BLADE SURG 15 STRL LF DISP TIS (BLADE) IMPLANT
BLADE SURG 15 STRL SS (BLADE)
BNDG COHESIVE 4X5 TAN STRL (GAUZE/BANDAGES/DRESSINGS) IMPLANT
BUR OVAL 6.0 (BURR) ×2 IMPLANT
CANISTER SUCT 3000ML (MISCELLANEOUS) IMPLANT
CANNULA TWIST IN 8.25X7CM (CANNULA) IMPLANT
DECANTER SPIKE VIAL GLASS SM (MISCELLANEOUS) IMPLANT
DRAPE SHOULDER BEACH CHAIR (DRAPES) ×2 IMPLANT
DRAPE U-SHAPE 47X51 STRL (DRAPES) ×4 IMPLANT
DRSG PAD ABDOMINAL 8X10 ST (GAUZE/BANDAGES/DRESSINGS) ×2 IMPLANT
DURAPREP 26ML APPLICATOR (WOUND CARE) ×2 IMPLANT
ELECT REM PT RETURN 9FT ADLT (ELECTROSURGICAL) ×2
ELECTRODE REM PT RTRN 9FT ADLT (ELECTROSURGICAL) ×1 IMPLANT
GAUZE SPONGE 4X4 12PLY STRL (GAUZE/BANDAGES/DRESSINGS) ×2 IMPLANT
GAUZE XEROFORM 1X8 LF (GAUZE/BANDAGES/DRESSINGS) ×2 IMPLANT
GLOVE BIO SURGEON STRL SZ7 (GLOVE) IMPLANT
GLOVE BIOGEL PI IND STRL 7.0 (GLOVE) IMPLANT
GLOVE BIOGEL PI IND STRL 7.5 (GLOVE) ×1 IMPLANT
GLOVE BIOGEL PI INDICATOR 7.0 (GLOVE)
GLOVE BIOGEL PI INDICATOR 7.5 (GLOVE) ×1
GLOVE SS BIOGEL STRL SZ 7.5 (GLOVE) ×1 IMPLANT
GLOVE SUPERSENSE BIOGEL SZ 7.5 (GLOVE) ×1
GOWN STRL REUS W/ TWL LRG LVL3 (GOWN DISPOSABLE) ×3 IMPLANT
GOWN STRL REUS W/TWL LRG LVL3 (GOWN DISPOSABLE) ×3
LOOP 2 FIBERLINK CLOSED (SUTURE) IMPLANT
MANIFOLD NEPTUNE II (INSTRUMENTS) ×2 IMPLANT
NDL SAFETY ECLIPSE 18X1.5 (NEEDLE) ×1 IMPLANT
NDL SUT 6 .5 CRC .975X.05 MAYO (NEEDLE) IMPLANT
NEEDLE 1/2 CIR CATGUT .05X1.09 (NEEDLE) IMPLANT
NEEDLE HYPO 18GX1.5 SHARP (NEEDLE) ×1
NEEDLE MAYO TAPER (NEEDLE)
NEEDLE SCORPION MULTI FIRE (NEEDLE) IMPLANT
PACK ARTHROSCOPY DSU (CUSTOM PROCEDURE TRAY) ×2 IMPLANT
PACK BASIN DAY SURGERY FS (CUSTOM PROCEDURE TRAY) ×2 IMPLANT
PAD ALCOHOL SWAB (MISCELLANEOUS) ×4 IMPLANT
PENCIL BUTTON HOLSTER BLD 10FT (ELECTRODE) IMPLANT
SET ARTHROSCOPY TUBING (MISCELLANEOUS) ×1
SET ARTHROSCOPY TUBING LN (MISCELLANEOUS) ×1 IMPLANT
SHEET MEDIUM DRAPE 40X70 STRL (DRAPES) IMPLANT
SLEEVE SCD COMPRESS KNEE MED (MISCELLANEOUS) IMPLANT
SLING ARM IMMOBILIZER MED (SOFTGOODS) IMPLANT
SLING ARM LRG ADULT FOAM STRAP (SOFTGOODS) IMPLANT
SLING ARM MED ADULT FOAM STRAP (SOFTGOODS) IMPLANT
SLING ARM XL FOAM STRAP (SOFTGOODS) IMPLANT
SLING ULTRA III MED (ORTHOPEDIC SUPPLIES) IMPLANT
SPONGE LAP 4X18 X RAY DECT (DISPOSABLE) IMPLANT
STRIP CLOSURE SKIN 1/2X4 (GAUZE/BANDAGES/DRESSINGS) IMPLANT
SUCTION FRAZIER TIP 10 FR DISP (SUCTIONS) IMPLANT
SUT ETHILON 3 0 PS 1 (SUTURE) ×2 IMPLANT
SUT FIBERWIRE #2 38 T-5 BLUE (SUTURE)
SUT PDS AB 2-0 CT2 27 (SUTURE) IMPLANT
SUT PROLENE 3 0 PS 2 (SUTURE) IMPLANT
SUT TIGER TAPE 7 IN WHITE (SUTURE) IMPLANT
SUT VIC AB 0 SH 27 (SUTURE) IMPLANT
SUT VIC AB 2-0 PS2 27 (SUTURE) IMPLANT
SUT VIC AB 2-0 SH 27 (SUTURE)
SUT VIC AB 2-0 SH 27XBRD (SUTURE) IMPLANT
SUTURE FIBERWR #2 38 T-5 BLUE (SUTURE) IMPLANT
SYR 20CC LL (SYRINGE) IMPLANT
SYR 5ML LL (SYRINGE) ×2 IMPLANT
SYR BULB 3OZ (MISCELLANEOUS) IMPLANT
TAPE FIBER 2MM 7IN #2 BLUE (SUTURE) IMPLANT
TAPE HYPAFIX 6X30 (GAUZE/BANDAGES/DRESSINGS) IMPLANT
TAPE STRIPS DRAPE STRL (GAUZE/BANDAGES/DRESSINGS) ×2 IMPLANT
TOWEL OR 17X24 6PK STRL BLUE (TOWEL DISPOSABLE) ×2 IMPLANT
TUBE CONNECTING 20X1/4 (TUBING) IMPLANT
WAND STAR VAC 90 (SURGICAL WAND) ×2 IMPLANT
WATER STERILE IRR 1000ML POUR (IV SOLUTION) ×2 IMPLANT

## 2014-03-15 NOTE — Transfer of Care (Signed)
Immediate Anesthesia Transfer of Care Note  Patient: Sarah Payne  Procedure(s) Performed: Procedure(s): LEFT SHOULDER ARTHROSCOPY DEBRIDEMENT SUBACROMIAL DECOMPRESSION DISTAL CLAVICLE EXCISION  (Left)  Patient Location: PACU  Anesthesia Type:GA combined with regional for post-op pain  Level of Consciousness: awake, alert , oriented and patient cooperative  Airway & Oxygen Therapy: Patient Spontanous Breathing and Patient connected to face mask oxygen  Post-op Assessment: Report given to PACU RN and Post -op Vital signs reviewed and stable  Post vital signs: Reviewed and stable  Complications: No apparent anesthesia complications

## 2014-03-15 NOTE — Discharge Instructions (Signed)
°  Post Anesthesia Home Care Instructions ° °Activity: °Get plenty of rest for the remainder of the day. A responsible adult should stay with you for 24 hours following the procedure.  °For the next 24 hours, DO NOT: °-Drive a car °-Operate machinery °-Drink alcoholic beverages °-Take any medication unless instructed by your physician °-Make any legal decisions or sign important papers. ° °Meals: °Start with liquid foods such as gelatin or soup. Progress to regular foods as tolerated. Avoid greasy, spicy, heavy foods. If nausea and/or vomiting occur, drink only clear liquids until the nausea and/or vomiting subsides. Call your physician if vomiting continues. ° °Special Instructions/Symptoms: °Your throat may feel dry or sore from the anesthesia or the breathing tube placed in your throat during surgery. If this causes discomfort, gargle with warm salt water. The discomfort should disappear within 24 hours. ° °Regional Anesthesia Blocks ° °1. Numbness or the inability to move the "blocked" extremity may last from 3-48 hours after placement. The length of time depends on the medication injected and your individual response to the medication. If the numbness is not going away after 48 hours, call your surgeon. ° °2. The extremity that is blocked will need to be protected until the numbness is gone and the  Strength has returned. Because you cannot feel it, you will need to take extra care to avoid injury. Because it may be weak, you may have difficulty moving it or using it. You may not know what position it is in without looking at it while the block is in effect. ° °3. For blocks in the legs and feet, returning to weight bearing and walking needs to be done carefully. You will need to wait until the numbness is entirely gone and the strength has returned. You should be able to move your leg and foot normally before you try and bear weight or walk. You will need someone to be with you when you first try to ensure you  do not fall and possibly risk injury. ° °4. Bruising and tenderness at the needle site are common side effects and will resolve in a few days. ° °5. Persistent numbness or new problems with movement should be communicated to the surgeon or the [facility] ([phone])/ [facility] ([phone]). °

## 2014-03-15 NOTE — H&P (Signed)
Sarah Payne is an 68 y.o. female.   Chief Complaint: left shoulder pain HPI: 89 yof with history of left shoulder pain that has failed antiinflammatories, intraarticular cortisone injections and home exercise program.  xrays show a type 2 acromion and MRI shows a partial cuff tear with capsular thickening.  She has pain with limited range of motion that interfers with activities of daily living.  Past Medical History  Diagnosis Date  . Diabetes mellitus   . Hypertension   . Hypothyroidism   . Depression   . Vascular dementia   . Tobacco abuse     a. 45 pack year history - quit 09/16/11  . Chronic low back pain   . Coronary artery disease     a. s/p PCI/DES LCX x 3;  b. NSTEMI 09/16/11;  c.   09/17/11 Cath [facility]) Severe RCA/LCX dzs - RCA treated, LCX intervention pending w/ Dr. Clayborn Bigness  . Sleep apnea     mild-does not use cpap  . Impingement syndrome of left shoulder   . Tendonitis of left rotator cuff     Past Surgical History  Procedure Laterality Date  . Back surgery    . Abdominal hysterectomy    . Appendectomy    . Cardiac catheterization  2013  . Coronary angioplasty  2012    stent x 3   . Coronary angioplasty with stent placement  2013    Family History  Problem Relation Age of Onset  . Heart attack Mother     First MI @ 51 - Died @ 18  . Heart disease Mother   . Heart disease Father     Died @ 64  . Throat cancer Brother    Social History:  reports that she has been smoking Cigarettes.  She has a 22.5 pack-year smoking history. She has never used smokeless tobacco. She reports that she uses illicit drugs (Marijuana). She reports that she does not drink alcohol.  Allergies:  Allergies  Allergen Reactions  . No Known Allergies     No prescriptions prior to admission  No current facility-administered medications for this encounter. Current outpatient prescriptions:albuterol (PROVENTIL HFA;VENTOLIN HFA) 108 (90 BASE) MCG/ACT inhaler, Inhale 2 puffs into the  lungs every 6 (six) hours as needed for wheezing., Disp: 1 Inhaler, Rfl: 11;  aspirin 81 MG tablet, Take 81 mg by mouth daily.  , Disp: , Rfl: ;  atorvastatin (LIPITOR) 40 MG tablet, Take 1 tablet (40 mg total) by mouth daily., Disp: 90 tablet, Rfl: 3 Calcium & Magnesium Carbonates (MYLANTA PO), Take by mouth.  , Disp: , Rfl: ;  carvedilol (COREG) 6.25 MG tablet, Take 1 tablet (6.25 mg total) by mouth 2 (two) times daily., Disp: 14 tablet, Rfl: 0;  clobetasol ointment (TEMOVATE) 0.05 %, APPLY EVERY NIGHT TO VAGINAL FOLDS, Disp: 60 g, Rfl: 1;  clonazePAM (KLONOPIN) 0.5 MG tablet, Take 1 tablet (0.5 mg total) by mouth at bedtime as needed for anxiety., Disp: 20 tablet, Rfl: 1 clopidogrel (PLAVIX) 75 MG tablet, TAKE 1 TABLET (75 MG TOTAL) BY MOUTH DAILY., Disp: 7 tablet, Rfl: 0;  cyanocobalamin (,VITAMIN B-12,) 1000 MCG/ML injection, every 30 (thirty) days. INJECT 1 ML (1,000 MCG TOTAL) INTO THE MUSCLE ONCE., Disp: , Rfl: ;  donepezil (ARICEPT) 5 MG tablet, Take 1 tablet (5 mg total) by mouth at bedtime., Disp: 90 tablet, Rfl: 1 DULoxetine (CYMBALTA) 60 MG capsule, TAKE 1 CAPSULE BY MOUTH DAILY., Disp: 90 capsule, Rfl: 1;  enalapril (VASOTEC) 10 MG tablet, Take  1 tablet (10 mg total) by mouth daily., Disp: 90 tablet, Rfl: 1;  etodolac (LODINE) 400 MG tablet, Take 1 tablet (400 mg total) by mouth 2 (two) times daily., Disp: 180 tablet, Rfl: 1;  fluticasone (FLOVENT HFA) 220 MCG/ACT inhaler, Inhale 1 puff into the lungs 2 (two) times daily., Disp: 2 Inhaler, Rfl: 1 gabapentin (NEURONTIN) 100 MG capsule, Take 1 capsule (100 mg total) by mouth 3 (three) times daily., Disp: 270 capsule, Rfl: 1;  glipiZIDE (GLUCOTROL) 10 MG tablet, Before breakfast and supper (evening meal), Disp: 180 tablet, Rfl: 1;  glucose blood (FREESTYLE LITE) test strip, Check blood sugar 3 times daily to check blood sugars - Uncontrolled diabetes mellitus, Disp: 300 each, Rfl: 3 glucose monitoring kit (FREESTYLE) monitoring kit, 1 each by Does  not apply route as needed for other. PHARMACY PLEASE SUBSTITUTE WHATEVER GLUCOMETER YOU CARRY, Disp: 1 each, Rfl: 1;  isosorbide mononitrate (IMDUR) 30 MG 24 hr tablet, Take 1 tablet (30 mg total) by mouth daily., Disp: 7 tablet, Rfl: 0;  Levothyroxine Sodium 137 MCG CAPS, Take 1 capsule (137 mcg total) by mouth daily before breakfast., Disp: 7 capsule, Rfl: 0 magnesium oxide (MAG-OX) 400 MG tablet, Take 1 tablet (400 mg total) by mouth daily., Disp: 90 tablet, Rfl: 1;  metFORMIN (GLUCOPHAGE) 500 MG tablet, Take 2 tablets (1,000 mg total) by mouth 2 (two) times daily with a meal., Disp: 180 tablet, Rfl: 1;  nitroGLYCERIN (NITROSTAT) 0.4 MG SL tablet, Place 1 tablet (0.4 mg total) under the tongue every 5 (five) minutes as needed., Disp: 30 tablet, Rfl: 1 ondansetron (ZOFRAN-ODT) 8 MG disintegrating tablet, Take 1 tablet (8 mg total) by mouth every 8 (eight) hours as needed for nausea., Disp: 20 tablet, Rfl: 0;  sitaGLIPtin (JANUVIA) 100 MG tablet, Take 1 tablet (100 mg total) by mouth daily., Disp: 90 tablet, Rfl: 1;  tiotropium (SPIRIVA HANDIHALER) 18 MCG inhalation capsule, Place 1 capsule (18 mcg total) into inhaler and inhale daily., Disp: 30 capsule, Rfl: 2 traZODone (DESYREL) 100 MG tablet, TAKE 1 TABLET (100 MG TOTAL) BY MOUTH AT BEDTIME., Disp: 90 tablet, Rfl: 1  No results found for this or any previous visit (from the past 48 hour(s)). No results found.  Review of Systems  Constitutional: Negative.   HENT: Negative.   Eyes: Negative.   Cardiovascular: Negative.   Gastrointestinal: Negative.   Genitourinary: Positive for dysuria.  Musculoskeletal: Positive for back pain and joint pain.       Left shoulder pain  Skin: Negative.   Neurological: Negative.   Endo/Heme/Allergies: Negative.     Height 5' 5.25" (1.657 m), weight 62.596 kg (138 lb). Physical Exam  Constitutional: She is oriented to person, place, and time. She appears well-developed and well-nourished.  HENT:  Head:  Normocephalic and atraumatic.  Mouth/Throat: Oropharynx is clear and moist.  Eyes: Conjunctivae and EOM are normal.  Neck: Neck supple.  Cardiovascular: Normal rate.   Respiratory: Effort normal.  Genitourinary:  Not pertinent to current symptomatology therefore not examined.  Musculoskeletal:  Left shoulder pain with limited range of motion.  She has a positive impingement and pain with rotator cuff stressing.  She has normal sensation and 2+ radial pulses.   Right shoulder has range of motion within normal limits with minimal pain and normal neurologic exam.  Neurological: She is alert and oriented to person, place, and time.  Skin: Skin is warm and dry.  Psychiatric: She has a normal mood and affect.     Assessment Principal Problem:  Impingement syndrome of left shoulder Active Problems:   CAD, multiple vessel   Hypothyroidism   Depression   Diabetes mellitus type 2 with peripheral artery disease   Bronchitis, chronic obstructive   Chronic systolic congestive heart failure   Obstructive sleep apnea   Noncompliance with diabetes treatment   Tendonitis of left rotator cuff    Plan Left shoulder arthroscopy with subacromial decompression with rotator cuff tear repair vs debridement.  The risks, benefits, and possible complications of the procedure were discussed in detail with the patient.  The patient is without question.  , J 03/15/2014, 7:00 AM

## 2014-03-15 NOTE — Anesthesia Postprocedure Evaluation (Signed)
Anesthesia Post Note  Patient: Sarah Payne  Procedure(s) Performed: Procedure(s) (LRB): LEFT SHOULDER ARTHROSCOPY DEBRIDEMENT SUBACROMIAL DECOMPRESSION DEBRIDEMENT LABRUM AND ROTATOR CUFF (Left)  Anesthesia type: General  Patient location: PACU  Post pain: Pain level controlled and Adequate analgesia  Post assessment: Post-op Vital signs reviewed, Patient's Cardiovascular Status Stable, Respiratory Function Stable, Patent Airway and Pain level controlled  Last Vitals:  Filed Vitals:   03/15/14 1015  BP: 138/70  Pulse: 73  Temp:   Resp: 17    Post vital signs: Reviewed and stable  Level of consciousness: awake, alert  and oriented  Complications: No apparent anesthesia complications

## 2014-03-15 NOTE — Interval H&P Note (Signed)
History and Physical Interval Note:  03/15/2014 8:40 AM  Sarah Payne  has presented today for surgery, with the diagnosis of IMPINGEMENT SYNDROME LEFT SHOULDER   The various methods of treatment have been discussed with the patient and family. After consideration of risks, benefits and other options for treatment, the patient has consented to  Procedure(s): LEFT SHOULDER ARTHROSCOPY DEBRIDEMENT SUBACROMIAL DECOMPRESSION DISTAL CLAVICLE EXCISION  (Left) as a surgical intervention .  The patient's history has been reviewed, patient examined, no change in status, stable for surgery.  I have reviewed the patient's chart and labs.  Questions were answered to the patient's satisfaction.     Elsie Saas A

## 2014-03-15 NOTE — Progress Notes (Signed)
Assisted Dr. Hodierne with left, ultrasound guided, interscalene  block. Side rails up, monitors on throughout procedure. See vital signs in flow sheet. Tolerated Procedure well. 

## 2014-03-15 NOTE — Anesthesia Preprocedure Evaluation (Signed)
Anesthesia Evaluation  Patient identified by MRN, date of birth, ID band Patient awake    Reviewed: Allergy & Precautions, H&P , NPO status , Patient's Chart, lab work & pertinent test results  Airway Mallampati: II  Neck ROM: full    Dental   Pulmonary sleep apnea , COPDCurrent Smoker,          Cardiovascular hypertension, + angina + CAD, + Peripheral Vascular Disease and +CHF     Neuro/Psych  Headaches, Depression    GI/Hepatic   Endo/Other  diabetes, Type 2Hypothyroidism   Renal/GU      Musculoskeletal   Abdominal   Peds  Hematology   Anesthesia Other Findings   Reproductive/Obstetrics                           Anesthesia Physical Anesthesia Plan  ASA: III  Anesthesia Plan: General and Regional   Post-op Pain Management: MAC Combined w/ Regional for Post-op pain   Induction: Intravenous  Airway Management Planned: Oral ETT  Additional Equipment:   Intra-op Plan:   Post-operative Plan: Extubation in OR  Informed Consent: I have reviewed the patients History and Physical, chart, labs and discussed the procedure including the risks, benefits and alternatives for the proposed anesthesia with the patient or authorized representative who has indicated his/her understanding and acceptance.     Plan Discussed with: CRNA, Anesthesiologist and Surgeon  Anesthesia Plan Comments:         Anesthesia Quick Evaluation

## 2014-03-15 NOTE — Anesthesia Procedure Notes (Addendum)
Anesthesia Regional Block:  Interscalene brachial plexus block  Pre-Anesthetic Checklist: ,, timeout performed, Correct Patient, Correct Site, Correct Laterality, Correct Procedure, Correct Position, site marked, Risks and benefits discussed,  Surgical consent,  Pre-op evaluation,  At surgeon's request and post-op pain management  Laterality: Left  Prep: chloraprep       Needles:  Injection technique: Single-shot  Needle Type: Echogenic Stimulator Needle     Needle Length: 5cm 5 cm Needle Gauge: 22 and 22 G    Additional Needles:  Procedures: ultrasound guided (picture in chart) and nerve stimulator Interscalene brachial plexus block  Nerve Stimulator or Paresthesia:  Response: biceps flexion, 0.45 mA,   Additional Responses:   Narrative:  Start time: 03/15/2014 8:35 AM End time: 03/15/2014 8:44 AM Injection made incrementally with aspirations every 5 mL.  Performed by: Personally  Anesthesiologist: Dr Marcie Bal  Additional Notes: Functioning IV was confirmed and monitors were applied.  A 61m 22ga Arrow echogenic stimulator needle was used. Sterile prep and drape,hand hygiene and sterile gloves were used.  Negative aspiration and negative test dose prior to incremental administration of local anesthetic. The patient tolerated the procedure well.  Ultrasound guidance: relevent anatomy identified, needle position confirmed, local anesthetic spread visualized around nerve(s), vascular puncture avoided.  Image printed for medical record.    Procedure Name: Intubation Date/Time: 03/15/2014 8:55 AM Performed by: Kenyanna Grzesiak Pre-anesthesia Checklist: Patient identified, Emergency Drugs available, Suction available and Patient being monitored Patient Re-evaluated:Patient Re-evaluated prior to inductionOxygen Delivery Method: Circle System Utilized Preoxygenation: Pre-oxygenation with 100% oxygen Intubation Type: IV induction Ventilation: Mask ventilation without  difficulty Laryngoscope Size: Mac and 3 Grade View: Grade I Tube type: Oral Number of attempts: 1 Airway Equipment and Method: stylet and LTA kit utilized Placement Confirmation: ETT inserted through vocal cords under direct vision,  positive ETCO2 and breath sounds checked- equal and bilateral Secured at: 21 cm Tube secured with: Tape Dental Injury: Teeth and Oropharynx as per pre-operative assessment

## 2014-03-16 NOTE — Op Note (Signed)
NAMEFAYELYNN, Sarah Payne                 ACCOUNT NO.:  [id number]  MEDICAL RECORD NO.:  [id number]  LOCATION:                                 FACILITY:  PHYSICIAN:  [name]. [name], M.D. DATE OF BIRTH:  1945/11/17  DATE OF PROCEDURE:  03/15/2014 DATE OF DISCHARGE:  03/15/2014                              OPERATIVE REPORT   PREOPERATIVE DIAGNOSES: 1. Left shoulder partial rotator cuff and partial labrum tear. 2. Left shoulder degenerative joint disease. 3. Left shoulder impingement.  POSTOPERATIVE DIAGNOSES: 1. Left shoulder partial rotator cuff and partial labrum tear. 2. Left shoulder degenerative joint disease. 3. Left shoulder impingement.  PROCEDURE: 1. Left shoulder EUA followed by arthroscopic debridement, partial     rotator cuff and partial labrum tear. 2. Left shoulder chondroplasty. 3. Left shoulder subacromial decompression.  SURGEON:  [name]. [name], M.D.  ASSISTANT:  Matthew Saras, PA-C  ANESTHESIA:  General.  OPERATIVE TIME:  45 minutes.  COMPLICATIONS:  None.  INDICATION FOR PROCEDURE:  Sarah Payne is a 68 year old woman who has had 2-3 years of increasing left shoulder pain with exam and MRI documenting partial rotator cuff, partial labrum tear, with moderate DJD with impingement.  She has failed multiple conservative modalities and is now to undergo arthroscopy.  DESCRIPTION OF PROCEDURE:  Sarah Payne was brought to the operating room on March 15, 2014, after an interscalene block, was placed in holding room by Anesthesia.  She was placed on operative table in supine position.  She received antibiotics preoperatively for prophylaxis. After being placed under general anesthesia, her left shoulder was examined.  She had full range of motion, her shoulder was stable to ligamentous exam.  She was then placed in beach-chair position.  Her shoulder and arm was prepped using sterile DuraPrep and draped using sterile technique.  Time-out procedure was  called and the correct left shoulder identified.  Initially, through a posterior arthroscopic portal, the arthroscope with a pump attached was placed, through an anterior portal an arthroscopic probe was placed.  On initial inspection, the articular cartilage in the glenohumeral joint showed 50% grade 4 and 50% grade 3 chondromalacia, which was debrided.  She had partial tearing of the anterior, superior, and posterior labrum 25-30%, which was debrided.  The anterior-inferior labrum and anterior-inferior glenohumeral ligament complex was intact.  Biceps tendon anchor was moderately lax but otherwise attached.  The biceps tendon was intact but it was slightly subluxed medially with partial tear of the subscapularis 25% and this was debrided.  The supraspinatus and infraspinatus were intact.  Teres minor was intact.  Inferior capsular recess showed moderate synovitis which was debrided, otherwise, it was free of pathology.  Subacromial space was entered and a lateral arthroscopic portal was made.  Moderately thickened bursitis was resected.  The rotator cuff was somewhat inflamed on the bursal surface, but no evidence of a tear.  Impingement was noted and a subacromial decompression was carried out removing 6-8 mm of the undersurface of the anterior, anterolateral, and anteromedial acromion and CA ligament release carried out as well.  The [facility] joint showed no significant spurring or degenerative changes and thus, this was not disturbed.  At this  point, the shoulder could be brought through full range of motion, with no impingement on the rotator cuff.  At this point, it was felt that all pathology had been satisfactorily addressed.  Instruments were removed.  The portal was closed with 3-0 nylon suture and the shoulder injected with 80 mg of Depo-Medrol and 10 mL of 0.25% Marcaine with epinephrine.  Sterile dressings and a sling applied, and the patient awakened and taken to recovery room in  stable condition.  FOLLOWUP CARE:  Sarah Payne will be followed as an outpatient, on OxyIR and valium with early physical therapy.  She will be seen back in the office in a week for sutures out and followup.     Mike Berntsen A. [name], M.D.     RAW/MEDQ  D:  03/15/2014  T:  03/15/2014  Job:  [id number]

## 2014-03-21 DIAGNOSIS — M25519 Pain in unspecified shoulder: Secondary | ICD-10-CM | POA: Diagnosis not present

## 2014-03-30 ENCOUNTER — Telehealth: Payer: Self-pay

## 2014-03-30 NOTE — Telephone Encounter (Signed)
Pt request refills on 15 medications; pt said there are some refills on the labels on the different bottles. Advised pt to contact [facility] and see if refills available; if no refills available pharmacy will send request for meds that needs refills. Pt will cb if needed.

## 2014-04-03 ENCOUNTER — Other Ambulatory Visit: Payer: Self-pay

## 2014-04-03 NOTE — Telephone Encounter (Signed)
Pt left v/m requesting refill levothyroxine [facility]; last refilled on med list from Dr Derrel Nip for quantity # 7.Please advise. Pt said Dr Deborra Medina has not filled before. And pt said she has been out of med for 1 week. Please advise.

## 2014-04-04 MED ORDER — LEVOTHYROXINE SODIUM 137 MCG PO CAPS: 137.0000 ug | ORAL_CAPSULE | Freq: Every day | ORAL | Status: DC

## 2014-04-04 NOTE — Telephone Encounter (Signed)
Ok to refill one month but needs to come in for TSH and FT4 if she is not seeing an endocrinologist anymore.

## 2014-04-26 ENCOUNTER — Encounter: Payer: Self-pay | Admitting: Family Medicine

## 2014-04-26 ENCOUNTER — Ambulatory Visit (INDEPENDENT_AMBULATORY_CARE_PROVIDER_SITE_OTHER): Payer: Medicare Other | Admitting: Family Medicine

## 2014-04-26 VITALS — BP 128/76 | HR 67 | Temp 97.9°F | Resp 16 | Wt 140.2 lb

## 2014-04-26 DIAGNOSIS — E038 Other specified hypothyroidism: Secondary | ICD-10-CM

## 2014-04-26 DIAGNOSIS — E1159 Type 2 diabetes mellitus with other circulatory complications: Secondary | ICD-10-CM | POA: Diagnosis not present

## 2014-04-26 DIAGNOSIS — Z23 Encounter for immunization: Secondary | ICD-10-CM | POA: Diagnosis not present

## 2014-04-26 DIAGNOSIS — E785 Hyperlipidemia, unspecified: Secondary | ICD-10-CM | POA: Diagnosis not present

## 2014-04-26 DIAGNOSIS — F32A Depression, unspecified: Secondary | ICD-10-CM

## 2014-04-26 DIAGNOSIS — I739 Peripheral vascular disease, unspecified: Secondary | ICD-10-CM

## 2014-04-26 DIAGNOSIS — E1151 Type 2 diabetes mellitus with diabetic peripheral angiopathy without gangrene: Secondary | ICD-10-CM

## 2014-04-26 DIAGNOSIS — I2510 Atherosclerotic heart disease of native coronary artery without angina pectoris: Secondary | ICD-10-CM

## 2014-04-26 DIAGNOSIS — J441 Chronic obstructive pulmonary disease with (acute) exacerbation: Secondary | ICD-10-CM | POA: Diagnosis not present

## 2014-04-26 DIAGNOSIS — F329 Major depressive disorder, single episode, unspecified: Secondary | ICD-10-CM

## 2014-04-26 LAB — LIPID PANEL
Cholesterol: 287 mg/dL — ABNORMAL HIGH (ref 0–200)
HDL: 34.3 mg/dL — ABNORMAL LOW (ref >39.00)
NonHDL: 252.7
Total CHOL/HDL Ratio: 8
Triglycerides: 425 mg/dL — ABNORMAL HIGH (ref 0.0–149.0)
VLDL: 85 mg/dL — ABNORMAL HIGH (ref 0.0–40.0)

## 2014-04-26 LAB — COMPREHENSIVE METABOLIC PANEL
ALT: 14 U/L (ref 0–35)
AST: 14 U/L (ref 0–37)
Albumin: 3.6 g/dL (ref 3.5–5.2)
Alkaline Phosphatase: 68 U/L (ref 39–117)
BUN: 22 mg/dL (ref 6–23)
CO2: 25 mEq/L (ref 19–32)
Calcium: 9.5 mg/dL (ref 8.4–10.5)
Chloride: 99 mEq/L (ref 96–112)
Creatinine, Ser: 1.1 mg/dL (ref 0.4–1.2)
GFR: 55.1 mL/min — ABNORMAL LOW (ref >60.00)
Glucose, Bld: 244 mg/dL — ABNORMAL HIGH (ref 70–99)
Potassium: 4.4 mEq/L (ref 3.5–5.1)
Sodium: 134 mEq/L — ABNORMAL LOW (ref 135–145)
Total Bilirubin: 0.5 mg/dL (ref 0.2–1.2)
Total Protein: 7.4 g/dL (ref 6.0–8.3)

## 2014-04-26 LAB — HEMOGLOBIN A1C: Hgb A1c MFr Bld: 8.8 % — ABNORMAL HIGH (ref 4.6–6.5)

## 2014-04-26 LAB — LDL CHOLESTEROL, DIRECT: Direct LDL: 174.5 mg/dL

## 2014-04-26 LAB — TSH: TSH: 0.99 u[IU]/mL (ref 0.35–4.50)

## 2014-04-26 LAB — T4, FREE: Free T4: 1.03 ng/dL (ref 0.60–1.60)

## 2014-04-26 MED ORDER — ALBUTEROL SULFATE HFA 108 (90 BASE) MCG/ACT IN AERS: 2.0000 | INHALATION_SPRAY | Freq: Four times a day (QID) | RESPIRATORY_TRACT | Status: DC | PRN

## 2014-04-26 MED ORDER — CLOPIDOGREL BISULFATE 75 MG PO TABS
ORAL_TABLET | ORAL | Status: DC
Start: 2014-04-26 — End: 2016-06-06

## 2014-04-26 MED ORDER — ATORVASTATIN CALCIUM 40 MG PO TABS: 40.0000 mg | ORAL_TABLET | Freq: Every day | ORAL | Status: DC

## 2014-04-26 MED ORDER — SITAGLIPTIN PHOSPHATE 100 MG PO TABS: 100.0000 mg | ORAL_TABLET | Freq: Every day | ORAL | Status: DC

## 2014-04-26 MED ORDER — METFORMIN HCL 500 MG PO TABS: 1000.0000 mg | ORAL_TABLET | Freq: Two times a day (BID) | ORAL | Status: DC

## 2014-04-26 MED ORDER — TRAZODONE HCL 100 MG PO TABS
ORAL_TABLET | ORAL | Status: DC
Start: 2014-04-26 — End: 2015-12-03

## 2014-04-26 MED ORDER — ENALAPRIL MALEATE 10 MG PO TABS: 10.0000 mg | ORAL_TABLET | Freq: Every day | ORAL | Status: DC

## 2014-04-26 MED ORDER — ISOSORBIDE MONONITRATE ER 30 MG PO TB24: 30.0000 mg | ORAL_TABLET | Freq: Every day | ORAL | Status: DC

## 2014-04-26 MED ORDER — DULOXETINE HCL 60 MG PO CPEP: ORAL_CAPSULE | ORAL | Status: DC

## 2014-04-26 MED ORDER — CARVEDILOL 6.25 MG PO TABS: 6.2500 mg | ORAL_TABLET | Freq: Two times a day (BID) | ORAL | Status: DC

## 2014-04-26 NOTE — Assessment & Plan Note (Signed)
She still has not kept appointment with an endocrinologist. >25 minutes spent in face to face time with patient, >50% spent in counselling or coordination of care discussing her diabetes and other chronic health issues. She agreed to go see Dr. Gabriel Carina if I referred her. Recheck labs today. She is on a statin and ACEI.

## 2014-04-26 NOTE — Addendum Note (Signed)
Addended by: Geni Bers on: 04/26/2014 07:54 AM   Modules accepted: Orders

## 2014-04-26 NOTE — Assessment & Plan Note (Signed)
Over due for lab work. Orders placed today.

## 2014-04-26 NOTE — Assessment & Plan Note (Signed)
Clinically euthyroid. Will recheck labs today.

## 2014-04-26 NOTE — Progress Notes (Signed)
Pre visit review using our clinic review tool, if applicable. No additional management support is needed unless otherwise documented below in the visit note. 

## 2014-04-26 NOTE — Progress Notes (Signed)
Subjective:   Patient ID: Sarah Payne, female    DOB: November 12, 1945, 68 y.o.   MRN: [id number]  Sarah Payne is a pleasant 68 y.o. year old female who presents to clinic today with Follow-up and Medication Refill  on 04/26/2014  HPI: Established care with me from Dr. Derrel Nip in 11/2013.  S/p left shoulder surgery 1 month ago.  Feels like she is getting better everyday.  Still doing physical therapy.  DM- was seeing Dr. Cruzita Lederer but does not want to drive to [place] anymore.  a1c remains elevated but improved.  She told me that she had an endocrinologist in [place].  She says she must have missed the appointment but "they never call me to remind me."  FSBS much better per pt- checking three times daily.  Running 78-105. Denies episodes of hypoglycemia. On glucotrol 10 mg twice daily, Metformin 1000 mg twice daily, and Januvia 100 mg daily. Sweats from Metformin have resolved. Lab Results  Component Value Date   HGBA1C 8.0* 11/22/2013   HLD- does take Lipitor- cholesterol medication daily.  She has been out of her medication for a couple of weeks. Lab Results  Component Value Date   CHOL 212* 02/25/2013   HDL 37.40* 02/25/2013   LDLCALC 70 04/30/2011   LDLDIRECT 111.0 02/25/2013   TRIG 572.0* 02/25/2013   CHOLHDL 6 02/25/2013   Lab Results  Component Value Date   ALT 14 05/30/2013   AST 19 05/30/2013   ALKPHOS 62 05/30/2013   BILITOT 0.6 05/30/2013    Patient Active Problem List   Diagnosis Date Noted  . Impingement syndrome of left shoulder   . Tendonitis of left rotator cuff   . Pre-operative clearance 03/10/2014  . Grief 03/10/2014  . Loss of weight 03/10/2014  . Diarrhea 03/10/2014  . Positive urine drug screen 11/22/2013  . Pain in joint, upper arm 04/12/2013  . Myalgia 04/06/2013  . COPD exacerbation 03/29/2013  . COPD, moderate 03/29/2013  . Sweating 03/24/2013  . Malaise 03/24/2013  . Noncompliance with diabetes treatment 02/27/2013  . Unspecified gastritis and  gastroduodenitis without mention of hemorrhage 02/27/2013  . Obstructive sleep apnea 02/25/2013  . Leg weakness, bilateral 10/07/2012  . Other malaise and fatigue 10/06/2012  . Chronic systolic congestive heart failure 05/21/2012  . Lichen sclerosus et atrophicus 05/20/2012  . Bronchitis, chronic obstructive 05/20/2012  . Headache 10/15/2011  . Angina at rest 09/25/2011  . Diabetes mellitus type 2 with peripheral artery disease 08/28/2011  . History of lumbar discectomy 06/29/2011  . Depression 06/24/2011  . Hypothyroidism 05/03/2011  . CAD, multiple vessel 02/24/2011  . Tobacco abuse 02/24/2011  . Hyperlipidemia 02/24/2011   Past Medical History  Diagnosis Date  . Diabetes mellitus   . Hypertension   . Hypothyroidism   . Depression   . Vascular dementia   . Tobacco abuse     a. 45 pack year history - quit 09/16/11  . Chronic low back pain   . Coronary artery disease     a. s/p PCI/DES LCX x 3;  b. NSTEMI 09/16/11;  c.   09/17/11 Cath [facility]) Severe RCA/LCX dzs - RCA treated, LCX intervention pending w/ Dr. Clayborn Bigness  . Sleep apnea     mild-does not use cpap  . Impingement syndrome of left shoulder   . Tendonitis of left rotator cuff    Past Surgical History  Procedure Laterality Date  . Back surgery    . Abdominal hysterectomy    . Appendectomy    .  Cardiac catheterization  2013  . Coronary angioplasty  2012    stent x 3   . Coronary angioplasty with stent placement  2013   History  Substance Use Topics  . Smoking status: Current Every Day Smoker -- 0.50 packs/day for 45 years    Types: Cigarettes  . Smokeless tobacco: Never Used     Comment: Has cut back, trying to quit.   . Alcohol Use: No   Family History  Problem Relation Age of Onset  . Heart attack Mother     First MI @ 23 - Died @ 72  . Heart disease Mother   . Heart disease Father     Died @ 25  . Throat cancer Brother    Allergies  Allergen Reactions  . No Known Allergies    Current Outpatient  Prescriptions on File Prior to Visit  Medication Sig Dispense Refill  . aspirin 81 MG tablet Take 81 mg by mouth daily.        . Calcium & Magnesium Carbonates (MYLANTA PO) Take by mouth.        . clobetasol ointment (TEMOVATE) 0.05 % APPLY EVERY NIGHT TO VAGINAL FOLDS  60 g  1  . clonazePAM (KLONOPIN) 0.5 MG tablet Take 1 tablet (0.5 mg total) by mouth at bedtime as needed for anxiety.  20 tablet  1  . cyanocobalamin (,VITAMIN B-12,) 1000 MCG/ML injection every 30 (thirty) days. INJECT 1 ML (1,000 MCG TOTAL) INTO THE MUSCLE ONCE.      Marland Kitchen diazepam (VALIUM) 2 MG tablet Take 1 tablet (2 mg total) by mouth every 8 (eight) hours as needed for muscle spasms.  20 tablet  0  . donepezil (ARICEPT) 5 MG tablet Take 1 tablet (5 mg total) by mouth at bedtime.  90 tablet  1  . etodolac (LODINE) 400 MG tablet Take 1 tablet (400 mg total) by mouth 2 (two) times daily.  180 tablet  1  . fluticasone (FLOVENT HFA) 220 MCG/ACT inhaler Inhale 1 puff into the lungs 2 (two) times daily.  2 Inhaler  1  . gabapentin (NEURONTIN) 100 MG capsule Take 1 capsule (100 mg total) by mouth 3 (three) times daily.  270 capsule  1  . glipiZIDE (GLUCOTROL) 10 MG tablet Before breakfast and supper (evening meal)  180 tablet  1  . glucose blood (FREESTYLE LITE) test strip Check blood sugar 3 times daily to check blood sugars - Uncontrolled diabetes mellitus  300 each  3  . glucose monitoring kit (FREESTYLE) monitoring kit 1 each by Does not apply route as needed for other. PHARMACY PLEASE SUBSTITUTE WHATEVER GLUCOMETER YOU CARRY  1 each  1  . Levothyroxine Sodium 137 MCG CAPS Take 1 capsule (137 mcg total) by mouth daily before breakfast.  30 capsule  0  . magnesium oxide (MAG-OX) 400 MG tablet Take 1 tablet (400 mg total) by mouth daily.  90 tablet  1  . nitroGLYCERIN (NITROSTAT) 0.4 MG SL tablet Place 1 tablet (0.4 mg total) under the tongue every 5 (five) minutes as needed.  30 tablet  1  . ondansetron (ZOFRAN-ODT) 8 MG  disintegrating tablet Take 1 tablet (8 mg total) by mouth every 8 (eight) hours as needed for nausea.  20 tablet  0  . oxyCODONE (ROXICODONE) 5 MG immediate release tablet 1-2 tablets every 4-6 hrs as needed for pain  60 tablet  0  . tiotropium (SPIRIVA HANDIHALER) 18 MCG inhalation capsule Place 1 capsule (18 mcg total) into inhaler  and inhale daily.  30 capsule  2   No current facility-administered medications on file prior to visit.   The PMH, PSH, Social History, Family History, Medications, and allergies have been reviewed in [facility], and have been updated if relevant.   Review of Systems See HPI +fatigue +left shoulder pain- improved ROM No episodes of hypoglycemia- sweats have resolved No CP or SOB No myalgias  No recent cough or wheezing Objective:    BP 128/76  Pulse 67  Temp(Src) 97.9 F (36.6 C) (Oral)  Resp 16  Wt 140 lb 4 oz (63.617 kg)  SpO2 98%   Physical Exam  Gen:  Alert, pleasant, NAD Resp:  CTA bilaterally CVS:  RRR Ext:  No edema +FROM of shoulders bilaterally Psych:  Good eye contact, not anxious or depressed appearing Neuro:  Normal gait, no tremor     Assessment & Plan:   Diabetes mellitus type 2 with peripheral artery disease - Plan: Ambulatory referral to Endocrinology, metFORMIN (GLUCOPHAGE) 500 MG tablet, sitaGLIPtin (JANUVIA) 100 MG tablet, Hemoglobin A1c, Comprehensive metabolic panel  CAD, multiple vessel - Plan: Lipid panel  Other specified hypothyroidism - Plan: TSH, T4, Free  COPD exacerbation  Hyperlipidemia  Depression No Follow-up on file.

## 2014-04-26 NOTE — Patient Instructions (Signed)
Great to see you. I will call you with your lab results.   

## 2014-04-27 ENCOUNTER — Telehealth: Payer: Self-pay | Admitting: Family Medicine

## 2014-04-27 NOTE — Telephone Encounter (Signed)
emmi emailed °

## 2014-05-05 DIAGNOSIS — F17200 Nicotine dependence, unspecified, uncomplicated: Secondary | ICD-10-CM | POA: Insufficient documentation

## 2014-05-05 DIAGNOSIS — G629 Polyneuropathy, unspecified: Secondary | ICD-10-CM | POA: Diagnosis not present

## 2014-05-05 DIAGNOSIS — E1140 Type 2 diabetes mellitus with diabetic neuropathy, unspecified: Secondary | ICD-10-CM | POA: Diagnosis not present

## 2014-05-05 DIAGNOSIS — E782 Mixed hyperlipidemia: Secondary | ICD-10-CM | POA: Diagnosis not present

## 2014-05-08 ENCOUNTER — Ambulatory Visit (INDEPENDENT_AMBULATORY_CARE_PROVIDER_SITE_OTHER): Payer: Medicare Other | Admitting: Cardiovascular Disease

## 2014-05-08 ENCOUNTER — Encounter: Payer: Self-pay | Admitting: Cardiovascular Disease

## 2014-05-08 VITALS — BP 132/82 | HR 68 | Ht 66.0 in | Wt 138.2 lb

## 2014-05-08 DIAGNOSIS — R079 Chest pain, unspecified: Secondary | ICD-10-CM

## 2014-05-08 DIAGNOSIS — E785 Hyperlipidemia, unspecified: Secondary | ICD-10-CM | POA: Diagnosis not present

## 2014-05-08 DIAGNOSIS — I2510 Atherosclerotic heart disease of native coronary artery without angina pectoris: Secondary | ICD-10-CM | POA: Diagnosis not present

## 2014-05-08 DIAGNOSIS — R197 Diarrhea, unspecified: Secondary | ICD-10-CM

## 2014-05-08 DIAGNOSIS — E1159 Type 2 diabetes mellitus with other circulatory complications: Secondary | ICD-10-CM

## 2014-05-08 DIAGNOSIS — Z720 Tobacco use: Secondary | ICD-10-CM

## 2014-05-08 DIAGNOSIS — E1151 Type 2 diabetes mellitus with diabetic peripheral angiopathy without gangrene: Secondary | ICD-10-CM

## 2014-05-08 DIAGNOSIS — I739 Peripheral vascular disease, unspecified: Secondary | ICD-10-CM

## 2014-05-08 MED ORDER — NITROGLYCERIN 0.4 MG SL SUBL: 0.4000 mg | SUBLINGUAL_TABLET | SUBLINGUAL | Status: DC | PRN

## 2014-05-08 MED ORDER — OMEPRAZOLE 20 MG PO CPDR: 20.0000 mg | DELAYED_RELEASE_CAPSULE | Freq: Two times a day (BID) | ORAL | Status: DC

## 2014-05-08 NOTE — Assessment & Plan Note (Signed)
Etiology of her diarrhea for the past month, recent nausea vomiting for the past 3 days is unclear. Possibly from stopping Cymbalta acutely, now restarting. Recent symptoms over the past weekend possibly viral

## 2014-05-08 NOTE — Assessment & Plan Note (Signed)
Currently with no symptoms of angina. No further workup at this time. Continue current medication regimen. Atypical fleeting sharp pains in her chest which she describes as a sharp nerve pain. No further workup at this time

## 2014-05-08 NOTE — Patient Instructions (Addendum)
Your next appointment will be scheduled in our new office located at :  [address], [place], [place] [address]  Please start omeprazole one pill twice a day Nitro for prolonged chest pain  Please call us if you have new issues that need to be addressed before your next appt.  Your physician wants you to follow-up in: 6 months.  You will receive a reminder letter in the mail two months in advance. If you don't receive a letter, please call our office to schedule the follow-up appointment.

## 2014-05-08 NOTE — Assessment & Plan Note (Signed)
Stressed the importance of taking her cholesterol medication. Several episodes of running out of her medications

## 2014-05-08 NOTE — Assessment & Plan Note (Signed)
We have encouraged her to continue to work on weaning her cigarettes and smoking cessation. She will continue to work on this and does not want any assistance with chantix.  

## 2014-05-08 NOTE — Progress Notes (Signed)
Patient ID: Sarah Payne, female    DOB: Nov 24, 1945, 68 y.o.   MRN: [id number]  HPI Comments: 68 year old woman with a long history of smoking, coronary artery disease, prior stent x3 placed to the mid left circumflex with moderate to severe LAD disease , stent placed to her distal RCA in February 2013, stent to the mid left circumflex in March 2013 , poorly controlled diabetes with hemoglobin A1c of 10, depression, hypertension, cardiac catheterization 08/19/2012 showing moderate mid LAD disease at the takeoff of the diagonal vessel, severe ostial to mid RCA disease, ejection fraction greater than 55%. She had FFR pressure wire showing severe disease of the RCA with drug-eluting stent x2 placed.  In followup today, she reports that she has been out of her medications for one month. Was restarted on her medications 4 days ago. History of running out of her medications such as in January 2015. TSH at that time 44 Started having diarrhea approximately one month ago, nausea vomiting started over the weekend 3 days ago. Is having sharp fleeting chest pain at rest off and on, not with exertion Overall does not feel well going off her medications and now restarting. Poor sleep which has been a chronic issue. History of depression  Lab work showing hemoglobin A1c 8.8, total cholesterol 287, LDL 174  No shortness of breath with exertion. No typical anginal symptoms.  Echocardiogram February 07, 2011 shows normal LV systolic function, diastolic dysfunction, moderate inferior wall hypokinesis, normal right ventricular systolic pressures.  EKG shows normal sinus rhythm with rate 68 beats per minute with T-wave abnormality in leads  one and aVL   Outpatient Encounter Prescriptions as of 05/08/2014  Medication Sig  . albuterol (PROVENTIL HFA;VENTOLIN HFA) 108 (90 BASE) MCG/ACT inhaler Inhale 2 puffs into the lungs every 6 (six) hours as needed for wheezing.  Marland Kitchen aspirin 81 MG tablet Take 81 mg by mouth daily.     Marland Kitchen atorvastatin (LIPITOR) 40 MG tablet Take 1 tablet (40 mg total) by mouth daily.  . Calcium & Magnesium Carbonates (MYLANTA PO) Take by mouth.    . carvedilol (COREG) 6.25 MG tablet Take 1 tablet (6.25 mg total) by mouth 2 (two) times daily.  . clobetasol ointment (TEMOVATE) 0.05 % APPLY EVERY NIGHT TO VAGINAL FOLDS  . clonazePAM (KLONOPIN) 0.5 MG tablet Take 1 tablet (0.5 mg total) by mouth at bedtime as needed for anxiety.  . clopidogrel (PLAVIX) 75 MG tablet TAKE 1 TABLET (75 MG TOTAL) BY MOUTH DAILY.  . cyanocobalamin (,VITAMIN B-12,) 1000 MCG/ML injection every 30 (thirty) days. INJECT 1 ML (1,000 MCG TOTAL) INTO THE MUSCLE ONCE.  Marland Kitchen diazepam (VALIUM) 2 MG tablet Take 1 tablet (2 mg total) by mouth every 8 (eight) hours as needed for muscle spasms.  Marland Kitchen donepezil (ARICEPT) 5 MG tablet Take 1 tablet (5 mg total) by mouth at bedtime.  . DULoxetine (CYMBALTA) 60 MG capsule TAKE 1 CAPSULE BY MOUTH DAILY.  Marland Kitchen enalapril (VASOTEC) 10 MG tablet Take 1 tablet (10 mg total) by mouth daily.  Marland Kitchen etodolac (LODINE) 400 MG tablet Take 1 tablet (400 mg total) by mouth 2 (two) times daily.  . fluticasone (FLOVENT HFA) 220 MCG/ACT inhaler Inhale 1 puff into the lungs 2 (two) times daily.  Marland Kitchen gabapentin (NEURONTIN) 100 MG capsule Take 1 capsule (100 mg total) by mouth 3 (three) times daily.  Marland Kitchen glipiZIDE (GLUCOTROL) 10 MG tablet Before breakfast and supper (evening meal)  . glucose blood (FREESTYLE LITE) test strip Check blood sugar  3 times daily to check blood sugars - Uncontrolled diabetes mellitus  . glucose monitoring kit (FREESTYLE) monitoring kit 1 each by Does not apply route as needed for other. PHARMACY PLEASE SUBSTITUTE WHATEVER GLUCOMETER YOU CARRY  . isosorbide mononitrate (IMDUR) 30 MG 24 hr tablet Take 1 tablet (30 mg total) by mouth daily.  . Levothyroxine Sodium 137 MCG CAPS Take 1 capsule (137 mcg total) by mouth daily before breakfast.  . magnesium oxide (MAG-OX) 400 MG tablet Take 1 tablet (400  mg total) by mouth daily.  . metFORMIN (GLUCOPHAGE) 500 MG tablet Take 2 tablets (1,000 mg total) by mouth 2 (two) times daily with a meal.  . nitroGLYCERIN (NITROSTAT) 0.4 MG SL tablet Place 1 tablet (0.4 mg total) under the tongue every 5 (five) minutes as needed.  . ondansetron (ZOFRAN-ODT) 8 MG disintegrating tablet Take 1 tablet (8 mg total) by mouth every 8 (eight) hours as needed for nausea.  Marland Kitchen oxyCODONE (ROXICODONE) 5 MG immediate release tablet 1-2 tablets every 4-6 hrs as needed for pain  . sitaGLIPtin (JANUVIA) 100 MG tablet Take 1 tablet (100 mg total) by mouth daily.  Marland Kitchen tiotropium (SPIRIVA HANDIHALER) 18 MCG inhalation capsule Place 1 capsule (18 mcg total) into inhaler and inhale daily.  . traZODone (DESYREL) 100 MG tablet TAKE 1 TABLET (100 MG TOTAL) BY MOUTH AT BEDTIME.    Review of Systems  Constitutional: Positive for fatigue.  HENT: Negative.   Eyes: Negative.   Respiratory: Negative.   Cardiovascular: Negative.   Gastrointestinal: Positive for nausea and diarrhea.  Endocrine: Negative.   Musculoskeletal: Negative.   Skin: Negative.   Allergic/Immunologic: Negative.   Neurological: Positive for weakness.  Hematological: Negative.   Psychiatric/Behavioral: Negative.   All other systems reviewed and are negative.  BP 132/82  Pulse 68  Ht _0  (1.676 m)  Wt 138 lb 4 oz (62.71 kg)  BMI 22.32 kg/m2  Physical Exam  Nursing note and vitals reviewed. Constitutional: She is oriented to person, place, and time. She appears well-developed and well-nourished.  HENT:  Head: Normocephalic.  Nose: Nose normal.  Mouth/Throat: Oropharynx is clear and moist.  Eyes: Conjunctivae are normal. Pupils are equal, round, and reactive to light.  Neck: Normal range of motion. Neck supple. No JVD present.  Cardiovascular: Normal rate, regular rhythm, S1 normal, S2 normal, normal heart sounds and intact distal pulses.  Exam reveals no gallop and no friction rub.   No murmur  heard. Pulmonary/Chest: Effort normal and breath sounds normal. No respiratory distress. She has no wheezes. She has no rales. She exhibits no tenderness.  Abdominal: Soft. Bowel sounds are normal. She exhibits no distension. There is no tenderness.  Musculoskeletal: Normal range of motion. She exhibits no edema and no tenderness.  Lymphadenopathy:    She has no cervical adenopathy.  Neurological: She is alert and oriented to person, place, and time. Coordination normal.  Skin: Skin is warm and dry. No rash noted. No erythema.  Psychiatric: She has a normal mood and affect. Her behavior is normal. Judgment and thought content normal.    Assessment and Plan

## 2014-05-08 NOTE — Assessment & Plan Note (Signed)
We have encouraged  careful diet management. Stressed the importance of medication compliance

## 2014-05-15 ENCOUNTER — Emergency Department: Payer: Self-pay | Admitting: Emergency Medicine

## 2014-05-15 ENCOUNTER — Ambulatory Visit: Payer: Medicare Other | Admitting: Internal Medicine

## 2014-05-15 DIAGNOSIS — Z7902 Long term (current) use of antithrombotics/antiplatelets: Secondary | ICD-10-CM | POA: Diagnosis not present

## 2014-05-15 DIAGNOSIS — Z720 Tobacco use: Secondary | ICD-10-CM | POA: Diagnosis not present

## 2014-05-15 DIAGNOSIS — Z79899 Other long term (current) drug therapy: Secondary | ICD-10-CM | POA: Diagnosis not present

## 2014-05-15 DIAGNOSIS — R1084 Generalized abdominal pain: Secondary | ICD-10-CM | POA: Diagnosis not present

## 2014-05-15 DIAGNOSIS — R197 Diarrhea, unspecified: Secondary | ICD-10-CM | POA: Diagnosis not present

## 2014-05-15 DIAGNOSIS — Z7982 Long term (current) use of aspirin: Secondary | ICD-10-CM | POA: Diagnosis not present

## 2014-05-15 DIAGNOSIS — E119 Type 2 diabetes mellitus without complications: Secondary | ICD-10-CM | POA: Diagnosis not present

## 2014-05-15 DIAGNOSIS — I10 Essential (primary) hypertension: Secondary | ICD-10-CM | POA: Diagnosis not present

## 2014-05-15 DIAGNOSIS — R112 Nausea with vomiting, unspecified: Secondary | ICD-10-CM | POA: Diagnosis not present

## 2014-05-15 DIAGNOSIS — R1110 Vomiting, unspecified: Secondary | ICD-10-CM | POA: Diagnosis not present

## 2014-05-15 DIAGNOSIS — R109 Unspecified abdominal pain: Secondary | ICD-10-CM | POA: Diagnosis not present

## 2014-05-15 DIAGNOSIS — Z8719 Personal history of other diseases of the digestive system: Secondary | ICD-10-CM | POA: Diagnosis not present

## 2014-05-15 DIAGNOSIS — N390 Urinary tract infection, site not specified: Secondary | ICD-10-CM | POA: Diagnosis not present

## 2014-05-15 DIAGNOSIS — Z0289 Encounter for other administrative examinations: Secondary | ICD-10-CM

## 2014-05-15 LAB — URINALYSIS, COMPLETE
Bilirubin,UR: NEGATIVE
Blood: NEGATIVE
Glucose,UR: NEGATIVE mg/dL (ref 0–75)
Hyaline Cast: 11
Ketone: NEGATIVE
Nitrite: NEGATIVE
Ph: 5 (ref 4.5–8.0)
Protein: NEGATIVE
RBC,UR: 1 /HPF (ref 0–5)
Specific Gravity: 1.019 (ref 1.003–1.030)
Squamous Epithelial: <1
WBC UR: 14 /HPF (ref 0–5)

## 2014-05-15 LAB — CBC WITH DIFFERENTIAL/PLATELET
Basophil #: 0 10*3/uL (ref 0.0–0.1)
Basophil %: 0.5 %
Eosinophil #: 0.1 10*3/uL (ref 0.0–0.7)
Eosinophil %: 1.2 %
HCT: 42.1 % (ref 35.0–47.0)
HGB: 13.5 g/dL (ref 12.0–16.0)
Lymphocyte #: 2.4 10*3/uL (ref 1.0–3.6)
Lymphocyte %: 26 %
MCH: 30.4 pg (ref 26.0–34.0)
MCHC: 32.1 g/dL (ref 32.0–36.0)
MCV: 95 fL (ref 80–100)
Monocyte #: 0.7 x10 3/mm (ref 0.2–0.9)
Monocyte %: 7.3 %
Neutrophil #: 6 10*3/uL (ref 1.4–6.5)
Neutrophil %: 65 %
Platelet: 297 10*3/uL (ref 150–440)
RBC: 4.46 10*6/uL (ref 3.80–5.20)
RDW: 14.9 % — ABNORMAL HIGH (ref 11.5–14.5)
WBC: 9.3 10*3/uL (ref 3.6–11.0)

## 2014-05-15 LAB — COMPREHENSIVE METABOLIC PANEL
Albumin: 3.8 g/dL (ref 3.4–5.0)
Alkaline Phosphatase: 81 U/L
Anion Gap: 8 (ref 7–16)
BUN: 15 mg/dL (ref 7–18)
Bilirubin,Total: 0.3 mg/dL (ref 0.2–1.0)
Calcium, Total: 8.9 mg/dL (ref 8.5–10.1)
Chloride: 104 mmol/L (ref 98–107)
Co2: 25 mmol/L (ref 21–32)
Creatinine: 1.1 mg/dL (ref 0.60–1.30)
EGFR (African American): >60
EGFR (Non-African Amer.): 53 — ABNORMAL LOW
Glucose: 188 mg/dL — ABNORMAL HIGH (ref 65–99)
Osmolality: 280 (ref 275–301)
Potassium: 4 mmol/L (ref 3.5–5.1)
SGOT(AST): 18 U/L (ref 15–37)
SGPT (ALT): 20 U/L
Sodium: 137 mmol/L (ref 136–145)
Total Protein: 7.3 g/dL (ref 6.4–8.2)

## 2014-05-15 LAB — LIPASE, BLOOD: Lipase: 251 U/L (ref 73–393)

## 2014-05-17 ENCOUNTER — Encounter: Payer: Self-pay | Admitting: Gastroenterology

## 2014-05-17 ENCOUNTER — Ambulatory Visit (INDEPENDENT_AMBULATORY_CARE_PROVIDER_SITE_OTHER): Payer: Medicare Other | Admitting: Family Medicine

## 2014-05-17 ENCOUNTER — Encounter: Payer: Self-pay | Admitting: Family Medicine

## 2014-05-17 VITALS — BP 110/66 | HR 74 | Temp 98.1°F | Wt 140.8 lb

## 2014-05-17 DIAGNOSIS — I739 Peripheral vascular disease, unspecified: Secondary | ICD-10-CM

## 2014-05-17 DIAGNOSIS — E1159 Type 2 diabetes mellitus with other circulatory complications: Secondary | ICD-10-CM

## 2014-05-17 DIAGNOSIS — K5730 Diverticulosis of large intestine without perforation or abscess without bleeding: Secondary | ICD-10-CM | POA: Diagnosis not present

## 2014-05-17 DIAGNOSIS — R197 Diarrhea, unspecified: Secondary | ICD-10-CM | POA: Diagnosis not present

## 2014-05-17 DIAGNOSIS — R1110 Vomiting, unspecified: Secondary | ICD-10-CM

## 2014-05-17 DIAGNOSIS — I2510 Atherosclerotic heart disease of native coronary artery without angina pectoris: Secondary | ICD-10-CM | POA: Diagnosis not present

## 2014-05-17 DIAGNOSIS — E1151 Type 2 diabetes mellitus with diabetic peripheral angiopathy without gangrene: Secondary | ICD-10-CM

## 2014-05-17 LAB — CBC WITH DIFFERENTIAL/PLATELET
Basophils Absolute: 0 10*3/uL (ref 0.0–0.1)
Basophils Relative: 0.3 % (ref 0.0–3.0)
Eosinophils Absolute: 0.1 10*3/uL (ref 0.0–0.7)
Eosinophils Relative: 0.9 % (ref 0.0–5.0)
HCT: 36.8 % (ref 36.0–46.0)
Hemoglobin: 12.1 g/dL (ref 12.0–15.0)
Lymphocytes Relative: 21.4 % (ref 12.0–46.0)
Lymphs Abs: 1.9 10*3/uL (ref 0.7–4.0)
MCHC: 33 g/dL (ref 30.0–36.0)
MCV: 92.7 fl (ref 78.0–100.0)
Monocytes Absolute: 0.6 10*3/uL (ref 0.1–1.0)
Monocytes Relative: 7.1 % (ref 3.0–12.0)
Neutro Abs: 6.4 10*3/uL (ref 1.4–7.7)
Neutrophils Relative %: 70.3 % (ref 43.0–77.0)
Platelets: 243 10*3/uL (ref 150.0–400.0)
RBC: 3.97 Mil/uL (ref 3.87–5.11)
RDW: 14.9 % (ref 11.5–15.5)
WBC: 9.1 10*3/uL (ref 4.0–10.5)

## 2014-05-17 LAB — COMPREHENSIVE METABOLIC PANEL
ALT: 7 U/L (ref 0–35)
AST: 16 U/L (ref 0–37)
Albumin: 3.3 g/dL — ABNORMAL LOW (ref 3.5–5.2)
Alkaline Phosphatase: 67 U/L (ref 39–117)
BUN: 8 mg/dL (ref 6–23)
CO2: 25 mEq/L (ref 19–32)
Calcium: 9.1 mg/dL (ref 8.4–10.5)
Chloride: 103 mEq/L (ref 96–112)
Creatinine, Ser: 1 mg/dL (ref 0.4–1.2)
GFR: 58.25 mL/min — ABNORMAL LOW (ref >60.00)
Glucose, Bld: 194 mg/dL — ABNORMAL HIGH (ref 70–99)
Potassium: 3.8 mEq/L (ref 3.5–5.1)
Sodium: 136 mEq/L (ref 135–145)
Total Bilirubin: 0.5 mg/dL (ref 0.2–1.2)
Total Protein: 6.6 g/dL (ref 6.0–8.3)

## 2014-05-17 LAB — LIPASE: Lipase: 44 U/L (ref 11.0–59.0)

## 2014-05-17 MED ORDER — METOCLOPRAMIDE HCL 5 MG PO TABS: 5.0000 mg | ORAL_TABLET | Freq: Three times a day (TID) | ORAL | Status: DC

## 2014-05-17 NOTE — Progress Notes (Signed)
Subjective:    Patient ID: Sarah Payne, female    DOB: 01-11-1946, 68 y.o.   MRN: [id number]  HPI  Here for ER follow up.  Went to [facility] on 10/26 for diarrhea and abdominal pain. Notes from [facility] reviewed.  She had acute onset of abdominal pain and diarrhea- so severe, she has been incontinent multiple times.  Vomited only once.  Stools now normal color but watery.    CBC wnl CMET unremarkable Lipase 251 CT of abdomen/pelvis with contrast- large bowel diverticulosis without evidence of acute inflammatory change.  Given Macrobid for UTI and 7 day course of flagyl for "diarrhea."  No further vomiting but she feels her diarrhea is actually worse.  Was in hospital last month for shoulder surgery.  No recent abx.  Does have h/o CAD- EKG wnl.  Per pt, evaluated by her cardiologist in ER and told her it was "probably reflux."  Current Outpatient Prescriptions on File Prior to Visit  Medication Sig Dispense Refill  . albuterol (PROVENTIL HFA;VENTOLIN HFA) 108 (90 BASE) MCG/ACT inhaler Inhale 2 puffs into the lungs every 6 (six) hours as needed for wheezing.  1 Inhaler  11  . aspirin 81 MG tablet Take 81 mg by mouth daily.        Marland Kitchen atorvastatin (LIPITOR) 40 MG tablet Take 1 tablet (40 mg total) by mouth daily.  90 tablet  3  . Calcium & Magnesium Carbonates (MYLANTA PO) Take by mouth.        . carvedilol (COREG) 6.25 MG tablet Take 1 tablet (6.25 mg total) by mouth 2 (two) times daily.  180 tablet  3  . clobetasol ointment (TEMOVATE) 0.05 % APPLY EVERY NIGHT TO VAGINAL FOLDS  60 g  1  . clonazePAM (KLONOPIN) 0.5 MG tablet Take 1 tablet (0.5 mg total) by mouth at bedtime as needed for anxiety.  20 tablet  1  . clopidogrel (PLAVIX) 75 MG tablet TAKE 1 TABLET (75 MG TOTAL) BY MOUTH DAILY.  90 tablet  3  . cyanocobalamin (,VITAMIN B-12,) 1000 MCG/ML injection every 30 (thirty) days. INJECT 1 ML (1,000 MCG TOTAL) INTO THE MUSCLE ONCE.      Marland Kitchen diazepam (VALIUM) 2 MG tablet Take 1 tablet (2 mg  total) by mouth every 8 (eight) hours as needed for muscle spasms.  20 tablet  0  . donepezil (ARICEPT) 5 MG tablet Take 1 tablet (5 mg total) by mouth at bedtime.  90 tablet  1  . DULoxetine (CYMBALTA) 60 MG capsule TAKE 1 CAPSULE BY MOUTH DAILY.  90 capsule  3  . enalapril (VASOTEC) 10 MG tablet Take 1 tablet (10 mg total) by mouth daily.  90 tablet  3  . etodolac (LODINE) 400 MG tablet Take 1 tablet (400 mg total) by mouth 2 (two) times daily.  180 tablet  1  . fluticasone (FLOVENT HFA) 220 MCG/ACT inhaler Inhale 1 puff into the lungs 2 (two) times daily.  2 Inhaler  1  . gabapentin (NEURONTIN) 100 MG capsule Take 1 capsule (100 mg total) by mouth 3 (three) times daily.  270 capsule  1  . glipiZIDE (GLUCOTROL) 10 MG tablet Before breakfast and supper (evening meal)  180 tablet  1  . glucose blood (FREESTYLE LITE) test strip Check blood sugar 3 times daily to check blood sugars - Uncontrolled diabetes mellitus  300 each  3  . glucose monitoring kit (FREESTYLE) monitoring kit 1 each by Does not apply route as needed for other. PHARMACY  PLEASE SUBSTITUTE WHATEVER GLUCOMETER YOU CARRY  1 each  1  . isosorbide mononitrate (IMDUR) 30 MG 24 hr tablet Take 1 tablet (30 mg total) by mouth daily.  90 tablet  3  . Levothyroxine Sodium 137 MCG CAPS Take 1 capsule (137 mcg total) by mouth daily before breakfast.  30 capsule  0  . magnesium oxide (MAG-OX) 400 MG tablet Take 1 tablet (400 mg total) by mouth daily.  90 tablet  1  . metFORMIN (GLUCOPHAGE) 500 MG tablet Take 2 tablets (1,000 mg total) by mouth 2 (two) times daily with a meal.  180 tablet  3  . nitroGLYCERIN (NITROSTAT) 0.4 MG SL tablet Place 1 tablet (0.4 mg total) under the tongue every 5 (five) minutes as needed.  25 tablet  2  . omeprazole (PRILOSEC) 20 MG capsule Take 1 capsule (20 mg total) by mouth 2 (two) times daily before a meal.  60 capsule  11  . ondansetron (ZOFRAN-ODT) 8 MG disintegrating tablet Take 1 tablet (8 mg total) by mouth  every 8 (eight) hours as needed for nausea.  20 tablet  0  . oxyCODONE (ROXICODONE) 5 MG immediate release tablet 1-2 tablets every 4-6 hrs as needed for pain  60 tablet  0  . sitaGLIPtin (JANUVIA) 100 MG tablet Take 1 tablet (100 mg total) by mouth daily.  90 tablet  3  . tiotropium (SPIRIVA HANDIHALER) 18 MCG inhalation capsule Place 1 capsule (18 mcg total) into inhaler and inhale daily.  30 capsule  2  . traZODone (DESYREL) 100 MG tablet TAKE 1 TABLET (100 MG TOTAL) BY MOUTH AT BEDTIME.  90 tablet  3   No current facility-administered medications on file prior to visit.    Allergies  Allergen Reactions  . No Known Allergies     Past Medical History  Diagnosis Date  . Diabetes mellitus   . Hypertension   . Hypothyroidism   . Depression   . Vascular dementia   . Tobacco abuse     a. 45 pack year history - quit 09/16/11  . Chronic low back pain   . Coronary artery disease     a. s/p PCI/DES LCX x 3;  b. NSTEMI 09/16/11;  c.   09/17/11 Cath [facility]) Severe RCA/LCX dzs - RCA treated, LCX intervention pending w/ Dr. Clayborn Bigness  . Sleep apnea     mild-does not use cpap  . Impingement syndrome of left shoulder   . Tendonitis of left rotator cuff     Past Surgical History  Procedure Laterality Date  . Back surgery    . Abdominal hysterectomy    . Appendectomy    . Cardiac catheterization  2013  . Coronary angioplasty  2012    stent x 3   . Coronary angioplasty with stent placement  2013    Family History  Problem Relation Age of Onset  . Heart attack Mother     First MI @ 25 - Died @ 66  . Heart disease Mother   . Heart disease Father     Died @ 77  . Throat cancer Brother     History   Social History  . Marital Status: Married    Spouse Name: N/A    Number of Children: N/A  . Years of Education: N/A   Occupational History  . Not on file.   Social History Main Topics  . Smoking status: Current Every Day Smoker -- 0.50 packs/day for 45 years    Types: Cigarettes    .  Smokeless tobacco: Never Used     Comment: Has cut back, trying to quit.   . Alcohol Use: No  . Drug Use: Yes    Special: Marijuana     Comment: smoked a "couple of nights ago"  . Sexual Activity: Not on file   Other Topics Concern  . Not on file   Social History Narrative   Lives at home with her husband in [place].  Previously used marijuana - quit.      Regular exercise: no/ pain from a frozen rotator cuff   Caffeine use: coffee daily and pepsi   The PMH, PSH, Social History, Family History, Medications, and allergies have been reviewed in [facility], and have been updated if relevant.   Review of Systems  Constitutional: Positive for fatigue. Negative for fever.  Gastrointestinal: Positive for vomiting, abdominal pain and diarrhea. Negative for nausea and blood in stool.  Genitourinary: Negative.   All other systems reviewed and are negative.      Objective:   Physical Exam  Nursing note and vitals reviewed. Constitutional: She is oriented to person, place, and time. She appears well-developed and well-nourished. No distress.  HENT:  Head: Normocephalic and atraumatic.  Abdominal: Soft. Normal appearance and bowel sounds are normal. There is generalized tenderness. There is no rebound.  Neurological: She is alert and oriented to person, place, and time.  Skin: Skin is warm and dry.  Psychiatric: She has a normal mood and affect. Her behavior is normal. Judgment and thought content normal.   BP 110/66  Pulse 74  Temp(Src) 98.1 F (36.7 C) (Oral)  Wt 140 lb 12 oz (63.844 kg)  SpO2 98% Wt Readings from Last 3 Encounters:  05/17/14 140 lb 12 oz (63.844 kg)  05/08/14 138 lb 4 oz (62.71 kg)  04/26/14 140 lb 4 oz (63.617 kg)          Assessment & Plan:

## 2014-05-17 NOTE — Patient Instructions (Signed)
Good to see you. Try Reglan this evening and with meals.  I will call you with your results from today.  Please stop by to see Rosaria Ferries after you go to the lab to set up your appointment with a stomach doctor.

## 2014-05-17 NOTE — Addendum Note (Signed)
Addended by: Daralene Milch C on: 05/17/2014 01:50 PM   Modules accepted: Orders

## 2014-05-17 NOTE — Progress Notes (Signed)
Pre visit review using our clinic review tool, if applicable. No additional management support is needed unless otherwise documented below in the visit note. 

## 2014-05-17 NOTE — Assessment & Plan Note (Signed)
New- persistent. Wide differential diagnosis at this point.  Could be viral although symptoms are concerning for other processes. Given poorly controlled diabetes, I question if she has a component of gastroparesis. Given rx for reglan- advised to use for a day or two and not to use longer than that if it does not improve her symptoms.  She will call me with an update. Will also order C diff given recent hospitalization. Given coronary disease- cardiac evaluation was appropriate. Will also refer to GI. Repeat labs today given severity of symptoms. Push fluids.

## 2014-05-19 DIAGNOSIS — R197 Diarrhea, unspecified: Secondary | ICD-10-CM | POA: Diagnosis not present

## 2014-05-20 LAB — C. DIFFICILE GDH AND TOXIN A/B
C. difficile GDH: NOT DETECTED
C. difficile Toxin A/B: NOT DETECTED

## 2014-05-23 ENCOUNTER — Encounter: Payer: Self-pay | Admitting: Gastroenterology

## 2014-05-23 ENCOUNTER — Telehealth: Payer: Self-pay | Admitting: *Deleted

## 2014-05-23 ENCOUNTER — Ambulatory Visit (INDEPENDENT_AMBULATORY_CARE_PROVIDER_SITE_OTHER): Payer: Medicare Other | Admitting: Gastroenterology

## 2014-05-23 VITALS — BP 110/60 | HR 63 | Ht 66.0 in | Wt 141.0 lb

## 2014-05-23 DIAGNOSIS — K219 Gastro-esophageal reflux disease without esophagitis: Secondary | ICD-10-CM

## 2014-05-23 DIAGNOSIS — Z7901 Long term (current) use of anticoagulants: Secondary | ICD-10-CM | POA: Diagnosis not present

## 2014-05-23 DIAGNOSIS — I2510 Atherosclerotic heart disease of native coronary artery without angina pectoris: Secondary | ICD-10-CM

## 2014-05-23 DIAGNOSIS — R197 Diarrhea, unspecified: Secondary | ICD-10-CM | POA: Diagnosis not present

## 2014-05-23 DIAGNOSIS — R1110 Vomiting, unspecified: Secondary | ICD-10-CM

## 2014-05-23 DIAGNOSIS — R112 Nausea with vomiting, unspecified: Secondary | ICD-10-CM | POA: Insufficient documentation

## 2014-05-23 MED ORDER — NA SULFATE-K SULFATE-MG SULF 17.5-3.13-1.6 GM/177ML PO SOLN: ORAL | Status: DC

## 2014-05-23 NOTE — Patient Instructions (Addendum)
You have been given a separate informational sheet regarding your tobacco use, the importance of quitting and local resources to help you quit.  You have been scheduled for a colonoscopy/endoscopy. Please follow written instructions given to you at your visit today.  Please pick up your prep kit at the pharmacy within the next 1-3 days. If you use inhalers (even only as needed), please bring them with you on the day of your procedure. Your physician has requested that you go to www.[email or web address].com (SENT TO E-MAIL)and enter the access code given to you at your visit today. This web site gives a general overview about your procedure. However, you should still follow specific instructions given to you by our office regarding your preparation for the procedure.

## 2014-05-23 NOTE — Progress Notes (Signed)
05/23/2014 Sarah Payne [id number] Feb 03, 1946   HISTORY OF PRESENT ILLNESS:  This is a 68 year old female who is new to our practice and was referred here by Dr. Deborra Medina for ongoing issues with diarrhea, nausea, and vomiting.  Patient is her today with her daughter.  Has never had colonoscopy; has not seen a GI doctor in probably 30 years.  She says that 3 weeks ago she had sudden onset of diarrhea, nausea, and vomiting.  She refers to this as an "upset stomach" or "belly ache"; says that it is the worst that she has ever had.  Had had incontinence of her bowels during the night since she's had diarrhea.  Having about 6 or so diarrhea stools per day.  Some lower abdominal pains.  Poor appetite.  Has lost 7 pounds since this began.  Denies blood in her stool.  Cdiff was negative but other stool studies were not checked.  Prior to three weeks ago she was feeling just fine.  Went to the ED in [place] where she had CT scan of the abdomen and pelvis with contrast that showed diverticulosis without diverticulitis and hepatic steatosis.  Recent CBC, lipase, TSH, and CMP were unremarkable.  Was given Zofran, which does help with the nausea to some degree.  None of her medications are new except for the Januvia.  She is on Plavix, which is prescribed by Dr. Evert Kohl for her CAD with PCI and stents placed in the past.  She also reports long-standing reflux issues that have been overall well controlled with omeprazole 20 mg BID for the past 3 years.    Past Medical History  Diagnosis Date  . Diabetes mellitus   . Hypertension   . Hypothyroidism   . Depression   . Vascular dementia   . Tobacco abuse     a. 45 pack year history - quit 09/16/11  . Chronic low back pain   . Coronary artery disease     a. s/p PCI/DES LCX x 3;  b. NSTEMI 09/16/11;  c.   09/17/11 Cath [facility]) Severe RCA/LCX dzs - RCA treated, LCX intervention pending w/ Dr. Clayborn Bigness  . Sleep apnea     mild-does not use cpap  . Impingement  syndrome of left shoulder   . Tendonitis of left rotator cuff   . Diverticulosis    Past Surgical History  Procedure Laterality Date  . Back surgery    . Abdominal hysterectomy    . Appendectomy    . Cardiac catheterization  2013  . Coronary angioplasty  2012    stent x 3   . Coronary angioplasty with stent placement  2013    reports that she has been smoking Cigarettes.  She has a 22.5 pack-year smoking history. She has never used smokeless tobacco. She reports that she uses illicit drugs (Marijuana). She reports that she does not drink alcohol. family history includes Heart attack in her mother; Heart disease in her father and mother; Throat cancer in her brother. Allergies  Allergen Reactions  . No Known Allergies       Outpatient Encounter Prescriptions as of 05/23/2014  Medication Sig  . albuterol (PROVENTIL HFA;VENTOLIN HFA) 108 (90 BASE) MCG/ACT inhaler Inhale 2 puffs into the lungs every 6 (six) hours as needed for wheezing.  Marland Kitchen aspirin 81 MG tablet Take 81 mg by mouth daily.    Marland Kitchen atorvastatin (LIPITOR) 40 MG tablet Take 1 tablet (40 mg total) by mouth daily.  . carvedilol (  COREG) 6.25 MG tablet Take 1 tablet (6.25 mg total) by mouth 2 (two) times daily.  . clonazePAM (KLONOPIN) 0.5 MG tablet Take 1 tablet (0.5 mg total) by mouth at bedtime as needed for anxiety.  . clopidogrel (PLAVIX) 75 MG tablet TAKE 1 TABLET (75 MG TOTAL) BY MOUTH DAILY.  . cyanocobalamin (,VITAMIN B-12,) 1000 MCG/ML injection every 30 (thirty) days. INJECT 1 ML (1,000 MCG TOTAL) INTO THE MUSCLE ONCE.  Marland Kitchen diazepam (VALIUM) 2 MG tablet Take 1 tablet (2 mg total) by mouth every 8 (eight) hours as needed for muscle spasms.  Marland Kitchen donepezil (ARICEPT) 5 MG tablet Take 1 tablet (5 mg total) by mouth at bedtime.  . DULoxetine (CYMBALTA) 60 MG capsule TAKE 1 CAPSULE BY MOUTH DAILY.  Marland Kitchen enalapril (VASOTEC) 10 MG tablet Take 1 tablet (10 mg total) by mouth daily.  Marland Kitchen etodolac (LODINE) 400 MG tablet Take 1 tablet (400 mg  total) by mouth 2 (two) times daily.  . fluticasone (FLOVENT HFA) 220 MCG/ACT inhaler Inhale 1 puff into the lungs 2 (two) times daily.  Marland Kitchen gabapentin (NEURONTIN) 100 MG capsule Take 1 capsule (100 mg total) by mouth 3 (three) times daily.  Marland Kitchen glipiZIDE (GLUCOTROL) 10 MG tablet Before breakfast and supper (evening meal)  . glucose blood (FREESTYLE LITE) test strip Check blood sugar 3 times daily to check blood sugars - Uncontrolled diabetes mellitus  . glucose monitoring kit (FREESTYLE) monitoring kit 1 each by Does not apply route as needed for other. PHARMACY PLEASE SUBSTITUTE WHATEVER GLUCOMETER YOU CARRY  . isosorbide mononitrate (IMDUR) 30 MG 24 hr tablet Take 1 tablet (30 mg total) by mouth daily.  . Levothyroxine Sodium 137 MCG CAPS Take 1 capsule (137 mcg total) by mouth daily before breakfast.  . magnesium oxide (MAG-OX) 400 MG tablet Take 1 tablet (400 mg total) by mouth daily.  . metFORMIN (GLUCOPHAGE) 500 MG tablet Take 2 tablets (1,000 mg total) by mouth 2 (two) times daily with a meal.  . metoCLOPramide (REGLAN) 5 MG tablet Take 1 tablet (5 mg total) by mouth 4 (four) times daily -  before meals and at bedtime.  . nitroGLYCERIN (NITROSTAT) 0.4 MG SL tablet Place 1 tablet (0.4 mg total) under the tongue every 5 (five) minutes as needed.  Marland Kitchen omeprazole (PRILOSEC) 20 MG capsule Take 1 capsule (20 mg total) by mouth 2 (two) times daily before a meal.  . ondansetron (ZOFRAN-ODT) 8 MG disintegrating tablet Take 1 tablet (8 mg total) by mouth every 8 (eight) hours as needed for nausea.  Marland Kitchen oxyCODONE (ROXICODONE) 5 MG immediate release tablet 1-2 tablets every 4-6 hrs as needed for pain  . sitaGLIPtin (JANUVIA) 100 MG tablet Take 1 tablet (100 mg total) by mouth daily.  Marland Kitchen tiotropium (SPIRIVA HANDIHALER) 18 MCG inhalation capsule Place 1 capsule (18 mcg total) into inhaler and inhale daily.  . traZODone (DESYREL) 100 MG tablet TAKE 1 TABLET (100 MG TOTAL) BY MOUTH AT BEDTIME.  . Calcium &  Magnesium Carbonates (MYLANTA PO) Take by mouth.    . clobetasol ointment (TEMOVATE) 0.05 % APPLY EVERY NIGHT TO VAGINAL FOLDS  . Na Sulfate-K Sulfate-Mg Sulf (SUPREP BOWEL PREP) SOLN USE PER PREP INSTRUCTIONS     REVIEW OF SYSTEMS  : All other systems reviewed and negative except where noted in the History of Present Illness.   PHYSICAL EXAM: BP 110/60 mmHg  Pulse 63  Ht _0  (1.676 m)  Wt 141 lb (63.957 kg)  BMI 22.77 kg/m2 General: Well developed white  female in no acute distress Head: Normocephalic and atraumatic Eyes:  Sclerae anicteric, conjunctiva pink. Ears: Normal auditory acuity Lungs: Clear throughout to auscultation Heart: Regular rate and rhythm Abdomen: Soft, non-distended.  Normal bowel sounds.  Mild lower abdominal TTP without R/R/G.Marland Kitchen Rectal:  Will be done at the time of colonoscopy. Musculoskeletal: Symmetrical with no gross deformities  Skin: No lesions on visible extremities Extremities: No edema  Neurological: Alert oriented x 4, grossly non-focal Psychological:  Alert and cooperative. Normal mood and affect  ASSESSMENT AND PLAN: -Diarrhea with nausea, vomiting, and abdominal pain:  Sudden onset 3 weeks ago.  Sounds infectious.  Cdiff negative.  CT scan negative.  Will check stool GI pathogen panel and O & P to rule out other infectious sources.  If infectious sources ruled out then ok to use Imodium for now.  Or could consider treating her empirically with a course of flagyl to see if this helps.  Will schedule for colonoscopy as well since she has never had one and she will need that for evaluation if no other diarrhea source is found.  The risks, benefits, and alternatives were discussed with the patient and she consents to proceed.  The risks benefits and alternatives to a temporary hold of anti-coagulants/anti-platelets for the procedure were discussed with the patient she consents to proceed. Obtain clearance from Dr. Evert Kohl regarding holding her plavix prior  to procedure.  Continue Zofran prn for now. -Chronic reflux:  Overall controlled with omeprazole 20 mg BID, but in the face on this ongoing nausea and vomiting recently as well (which sounds like it may be part of an infectious process), will evaluate with EGD as well.  The risks, benefits, and alternatives were discussed with the patient and she consents to proceed.

## 2014-05-23 NOTE — Telephone Encounter (Signed)
  05/23/2014   RE: SATHVIKA OJO DOB: Feb 19, 1946 MRN: [id number]   Dear Dr. Rockey Situ,    We have scheduled the above patient for an Colonoscopy and Endoscopy. Our records show that she is on anticoagulation therapy.   Please advise as to how long the patient may come off her therapy of Plavix prior to the procedure, which is scheduled for 07-25-2014  Please route the completed form to Evette Georges., CMA   Sincerely,    Hope Pigeon

## 2014-05-24 NOTE — Progress Notes (Signed)
Jess I think this came to me in error.

## 2014-05-24 NOTE — Progress Notes (Signed)
Recommend empiric therapy with Cipro and Flagyl for presumed infectious enteritis.  This is following stool pathogen panel.  Would defer colonoscopy until acute GI symptoms have entirely subsided.

## 2014-05-25 ENCOUNTER — Other Ambulatory Visit (INDEPENDENT_AMBULATORY_CARE_PROVIDER_SITE_OTHER): Payer: Medicare Other

## 2014-05-25 DIAGNOSIS — R1110 Vomiting, unspecified: Secondary | ICD-10-CM

## 2014-05-25 DIAGNOSIS — R197 Diarrhea, unspecified: Secondary | ICD-10-CM

## 2014-05-26 ENCOUNTER — Telehealth: Payer: Self-pay | Admitting: Internal Medicine

## 2014-05-26 LAB — GASTROINTESTINAL PATHOGEN PANEL PCR
C. difficile Tox A/B, PCR: NEGATIVE
Campylobacter, PCR: NEGATIVE
Cryptosporidium, PCR: NEGATIVE
E coli (ETEC) LT/ST PCR: NEGATIVE
E coli (STEC) stx1/stx2, PCR: POSITIVE — CR
E coli 0157, PCR: NEGATIVE
Giardia lamblia, PCR: NEGATIVE
Norovirus, PCR: NEGATIVE
Rotavirus A, PCR: NEGATIVE
Salmonella, PCR: NEGATIVE
Shigella, PCR: NEGATIVE

## 2014-05-26 LAB — OVA AND PARASITE EXAMINATION: OP: NONE SEEN

## 2014-05-26 NOTE — Telephone Encounter (Signed)
GI path panel + for STEC E. Coli Best not to treat with abx or imodium, both have been associated with higher rates of HUS Informed pt by phone.  Encourage hydration, supportive care, hand washing Will alert Dr. Deatra Ina and Alonza Bogus, [facility]

## 2014-05-28 NOTE — Telephone Encounter (Signed)
Ok to come off plavix for 5 days Would stay on ASA

## 2014-05-29 ENCOUNTER — Telehealth: Payer: Self-pay | Admitting: *Deleted

## 2014-05-29 NOTE — Telephone Encounter (Signed)
Patient wanted [place] to know that she has food poisoning from meat at [facility]   Patient notified her doctor and [employer]  Wanted Janett Billow to be aware as well Will forward message

## 2014-05-29 NOTE — Telephone Encounter (Signed)
Patient notified that she can hold Plavix 5 days prior to procedure Patient notified to continue Aspirin  Patient verbalized understanding  Patient wanted [place] to know that she has food poisoning from meat at [facility]   Patient notified her doctor and [employer]  Wanted Sarah Payne to be aware as well Will forward message

## 2014-06-09 ENCOUNTER — Telehealth: Payer: Self-pay

## 2014-06-09 NOTE — Telephone Encounter (Signed)
No she would need to be seen for this.

## 2014-06-09 NOTE — Telephone Encounter (Signed)
Pt said Dr Deborra Medina called in [place] taking 1 -2 tabs daily prn for nausea (pt spelled med for me) to [place]. Not on med list and [facility] does not know what med is. I asked pt if could be Reglan and she said no. Pt request note sent to Dr Deborra Medina for refill. Please advise.pt request cb.

## 2014-06-09 NOTE — Telephone Encounter (Signed)
Spoke to pt and appt sched for 11/23

## 2014-06-12 ENCOUNTER — Ambulatory Visit (INDEPENDENT_AMBULATORY_CARE_PROVIDER_SITE_OTHER): Payer: Medicare Other | Admitting: Family Medicine

## 2014-06-12 ENCOUNTER — Encounter: Payer: Self-pay | Admitting: Family Medicine

## 2014-06-12 ENCOUNTER — Emergency Department: Payer: Self-pay | Admitting: Emergency Medicine

## 2014-06-12 VITALS — BP 144/82 | HR 68 | Temp 98.2°F | Wt 138.2 lb

## 2014-06-12 DIAGNOSIS — R197 Diarrhea, unspecified: Secondary | ICD-10-CM

## 2014-06-12 DIAGNOSIS — I2510 Atherosclerotic heart disease of native coronary artery without angina pectoris: Secondary | ICD-10-CM

## 2014-06-12 DIAGNOSIS — K2970 Gastritis, unspecified, without bleeding: Secondary | ICD-10-CM

## 2014-06-12 DIAGNOSIS — R110 Nausea: Secondary | ICD-10-CM

## 2014-06-12 DIAGNOSIS — R1110 Vomiting, unspecified: Secondary | ICD-10-CM | POA: Diagnosis not present

## 2014-06-12 DIAGNOSIS — K2990 Gastroduodenitis, unspecified, without bleeding: Secondary | ICD-10-CM | POA: Diagnosis not present

## 2014-06-12 DIAGNOSIS — R112 Nausea with vomiting, unspecified: Secondary | ICD-10-CM

## 2014-06-12 DIAGNOSIS — E119 Type 2 diabetes mellitus without complications: Secondary | ICD-10-CM | POA: Diagnosis not present

## 2014-06-12 DIAGNOSIS — I10 Essential (primary) hypertension: Secondary | ICD-10-CM | POA: Diagnosis not present

## 2014-06-12 LAB — URINALYSIS, COMPLETE
Bacteria: NONE SEEN
Bilirubin,UR: NEGATIVE
Blood: NEGATIVE
Glucose,UR: NEGATIVE mg/dL (ref 0–75)
Ketone: NEGATIVE
Nitrite: NEGATIVE
Ph: 5 (ref 4.5–8.0)
Protein: NEGATIVE
RBC,UR: 1 /HPF (ref 0–5)
Specific Gravity: 1.01 (ref 1.003–1.030)
Squamous Epithelial: 2
WBC UR: 6 /HPF (ref 0–5)

## 2014-06-12 LAB — COMPREHENSIVE METABOLIC PANEL
Albumin: 3.5 g/dL (ref 3.4–5.0)
Alkaline Phosphatase: 73 U/L
Anion Gap: 9 (ref 7–16)
BUN: 18 mg/dL (ref 7–18)
Bilirubin,Total: 0.3 mg/dL (ref 0.2–1.0)
Calcium, Total: 8.9 mg/dL (ref 8.5–10.1)
Chloride: 102 mmol/L (ref 98–107)
Co2: 26 mmol/L (ref 21–32)
Creatinine: 1.28 mg/dL (ref 0.60–1.30)
EGFR (African American): 54 — ABNORMAL LOW
EGFR (Non-African Amer.): 44 — ABNORMAL LOW
Glucose: 153 mg/dL — ABNORMAL HIGH (ref 65–99)
Osmolality: 279 (ref 275–301)
Potassium: 3.8 mmol/L (ref 3.5–5.1)
SGOT(AST): 21 U/L (ref 15–37)
SGPT (ALT): 16 U/L
Sodium: 137 mmol/L (ref 136–145)
Total Protein: 6.9 g/dL (ref 6.4–8.2)

## 2014-06-12 LAB — CBC WITH DIFFERENTIAL/PLATELET
Basophil #: 0 10*3/uL (ref 0.0–0.1)
Basophil %: 0.5 %
Eosinophil #: 0.1 10*3/uL (ref 0.0–0.7)
Eosinophil %: 1.4 %
HCT: 39.5 % (ref 35.0–47.0)
HGB: 12.7 g/dL (ref 12.0–16.0)
Lymphocyte #: 2.9 10*3/uL (ref 1.0–3.6)
Lymphocyte %: 31.6 %
MCH: 30.5 pg (ref 26.0–34.0)
MCHC: 32.2 g/dL (ref 32.0–36.0)
MCV: 95 fL (ref 80–100)
Monocyte #: 0.5 x10 3/mm (ref 0.2–0.9)
Monocyte %: 4.9 %
Neutrophil #: 5.7 10*3/uL (ref 1.4–6.5)
Neutrophil %: 61.6 %
Platelet: 269 10*3/uL (ref 150–440)
RBC: 4.17 10*6/uL (ref 3.80–5.20)
RDW: 14.6 % — ABNORMAL HIGH (ref 11.5–14.5)
WBC: 9.2 10*3/uL (ref 3.6–11.0)

## 2014-06-12 LAB — LIPASE, BLOOD: Lipase: 217 U/L (ref 73–393)

## 2014-06-12 LAB — MAGNESIUM: Magnesium: 1.1 mg/dL — ABNORMAL LOW

## 2014-06-12 MED ORDER — ONDANSETRON 8 MG PO TBDP
8.0000 mg | ORAL_TABLET | Freq: Three times a day (TID) | ORAL | Status: DC | PRN
Start: 2014-06-12 — End: 2014-08-16

## 2014-06-12 NOTE — Progress Notes (Signed)
Pre visit review using our clinic review tool, if applicable. No additional management support is needed unless otherwise documented below in the visit note. 

## 2014-06-12 NOTE — Assessment & Plan Note (Signed)
Deteriorated with persistent nausea and weight loss. + STEC E.coli.  I am concerned about dehydration.  Advised that she go to [facility] for fluids, needs to have renal function/electrolytes checked as well (defered her since she is going to [facility]).  Zofran eRx sent as well.  Triage nurse at [facility] contacted. The patient indicates understanding of these issues and agrees with the plan.

## 2014-06-12 NOTE — Progress Notes (Signed)
Subjective:    Patient ID: Sarah Payne, female    DOB: 05-Dec-1945, 68 y.o.   MRN: [id number]  HPI  Here for persistent diarrhea, nausea and poor po intake.  Went to [facility] on 10/26 for diarrhea and abdominal pain. Notes from [facility] reviewed.  She had acute onset of abdominal pain and diarrhea- so severe, she has been incontinent multiple times.  Vomited only once.  Stools now normal color but watery.    CBC wnl CMET unremarkable Lipase 251 CT of abdomen/pelvis with contrast- large bowel diverticulosis without evidence of acute inflammatory change.  Given Macrobid for UTI and 7 day course of flagyl for "diarrhea."   Does have h/o CAD- EKG wnl.  Per pt, evaluated by her cardiologist in ER and told her it was "probably reflux."  I referred her to GI- she saw Janett Billow on 11/3- GI path + for STEC E. Coli.  Dr. Hilarie Fredrickson advised supportive care. Does have endoscopy scheduled for 07/25/14.  She says she is actually feeling worse.  She is a diabetic and has not been able to eat much.  Vomiting has stopped but still constantly nauseated with diarrhea.  Wants something for her nausea.  I did send in rx for reglan at last OV but she did not pick it up.  Current Outpatient Prescriptions on File Prior to Visit  Medication Sig Dispense Refill  . albuterol (PROVENTIL HFA;VENTOLIN HFA) 108 (90 BASE) MCG/ACT inhaler Inhale 2 puffs into the lungs every 6 (six) hours as needed for wheezing. 1 Inhaler 11  . aspirin 81 MG tablet Take 81 mg by mouth daily.      Marland Kitchen atorvastatin (LIPITOR) 40 MG tablet Take 1 tablet (40 mg total) by mouth daily. 90 tablet 3  . Calcium & Magnesium Carbonates (MYLANTA PO) Take by mouth.      . carvedilol (COREG) 6.25 MG tablet Take 1 tablet (6.25 mg total) by mouth 2 (two) times daily. 180 tablet 3  . clobetasol ointment (TEMOVATE) 0.05 % APPLY EVERY NIGHT TO VAGINAL FOLDS 60 g 1  . clonazePAM (KLONOPIN) 0.5 MG tablet Take 1 tablet (0.5 mg total) by mouth at bedtime as needed for  anxiety. 20 tablet 1  . clopidogrel (PLAVIX) 75 MG tablet TAKE 1 TABLET (75 MG TOTAL) BY MOUTH DAILY. 90 tablet 3  . cyanocobalamin (,VITAMIN B-12,) 1000 MCG/ML injection every 30 (thirty) days. INJECT 1 ML (1,000 MCG TOTAL) INTO THE MUSCLE ONCE.    Marland Kitchen diazepam (VALIUM) 2 MG tablet Take 1 tablet (2 mg total) by mouth every 8 (eight) hours as needed for muscle spasms. 20 tablet 0  . donepezil (ARICEPT) 5 MG tablet Take 1 tablet (5 mg total) by mouth at bedtime. 90 tablet 1  . DULoxetine (CYMBALTA) 60 MG capsule TAKE 1 CAPSULE BY MOUTH DAILY. 90 capsule 3  . enalapril (VASOTEC) 10 MG tablet Take 1 tablet (10 mg total) by mouth daily. 90 tablet 3  . etodolac (LODINE) 400 MG tablet Take 1 tablet (400 mg total) by mouth 2 (two) times daily. 180 tablet 1  . fluticasone (FLOVENT HFA) 220 MCG/ACT inhaler Inhale 1 puff into the lungs 2 (two) times daily. 2 Inhaler 1  . gabapentin (NEURONTIN) 100 MG capsule Take 1 capsule (100 mg total) by mouth 3 (three) times daily. 270 capsule 1  . glipiZIDE (GLUCOTROL) 10 MG tablet Before breakfast and supper (evening meal) 180 tablet 1  . glucose blood (FREESTYLE LITE) test strip Check blood sugar 3 times daily to check  blood sugars - Uncontrolled diabetes mellitus 300 each 3  . glucose monitoring kit (FREESTYLE) monitoring kit 1 each by Does not apply route as needed for other. PHARMACY PLEASE SUBSTITUTE WHATEVER GLUCOMETER YOU CARRY 1 each 1  . isosorbide mononitrate (IMDUR) 30 MG 24 hr tablet Take 1 tablet (30 mg total) by mouth daily. 90 tablet 3  . Levothyroxine Sodium 137 MCG CAPS Take 1 capsule (137 mcg total) by mouth daily before breakfast. 30 capsule 0  . magnesium oxide (MAG-OX) 400 MG tablet Take 1 tablet (400 mg total) by mouth daily. 90 tablet 1  . metFORMIN (GLUCOPHAGE) 500 MG tablet Take 2 tablets (1,000 mg total) by mouth 2 (two) times daily with a meal. 180 tablet 3  . metoCLOPramide (REGLAN) 5 MG tablet Take 1 tablet (5 mg total) by mouth 4 (four) times  daily -  before meals and at bedtime. 20 tablet 0  . Na Sulfate-K Sulfate-Mg Sulf (SUPREP BOWEL PREP) SOLN USE PER PREP INSTRUCTIONS 1 Bottle 0  . nitroGLYCERIN (NITROSTAT) 0.4 MG SL tablet Place 1 tablet (0.4 mg total) under the tongue every 5 (five) minutes as needed. 25 tablet 2  . omeprazole (PRILOSEC) 20 MG capsule Take 1 capsule (20 mg total) by mouth 2 (two) times daily before a meal. 60 capsule 11  . oxyCODONE (ROXICODONE) 5 MG immediate release tablet 1-2 tablets every 4-6 hrs as needed for pain 60 tablet 0  . sitaGLIPtin (JANUVIA) 100 MG tablet Take 1 tablet (100 mg total) by mouth daily. 90 tablet 3  . tiotropium (SPIRIVA HANDIHALER) 18 MCG inhalation capsule Place 1 capsule (18 mcg total) into inhaler and inhale daily. 30 capsule 2  . traZODone (DESYREL) 100 MG tablet TAKE 1 TABLET (100 MG TOTAL) BY MOUTH AT BEDTIME. 90 tablet 3   No current facility-administered medications on file prior to visit.    Allergies  Allergen Reactions  . No Known Allergies     Past Medical History  Diagnosis Date  . Diabetes mellitus   . Hypertension   . Hypothyroidism   . Depression   . Vascular dementia   . Tobacco abuse     a. 45 pack year history - quit 09/16/11  . Chronic low back pain   . Coronary artery disease     a. s/p PCI/DES LCX x 3;  b. NSTEMI 09/16/11;  c.   09/17/11 Cath [facility]) Severe RCA/LCX dzs - RCA treated, LCX intervention pending w/ Dr. Clayborn Bigness  . Sleep apnea     mild-does not use cpap  . Impingement syndrome of left shoulder   . Tendonitis of left rotator cuff   . Diverticulosis     Past Surgical History  Procedure Laterality Date  . Back surgery    . Abdominal hysterectomy    . Appendectomy    . Cardiac catheterization  2013  . Coronary angioplasty  2012    stent x 3   . Coronary angioplasty with stent placement  2013    Family History  Problem Relation Age of Onset  . Heart attack Mother     First MI @ 25 - Died @ 65  . Heart disease Mother   . Heart  disease Father     Died @ 59  . Throat cancer Brother     History   Social History  . Marital Status: Married    Spouse Name: N/A    Number of Children: N/A  . Years of Education: N/A   Occupational History  .  Not on file.   Social History Main Topics  . Smoking status: Current Every Day Smoker -- 0.50 packs/day for 45 years    Types: Cigarettes  . Smokeless tobacco: Never Used     Comment: Has cut back, trying to quit.   . Alcohol Use: No  . Drug Use: Yes    Special: Marijuana     Comment: smoked a "couple of nights ago"  . Sexual Activity: Not on file   Other Topics Concern  . Not on file   Social History Narrative   Lives at home with her husband in [place].  Previously used marijuana - quit.      Regular exercise: no/ pain from a frozen rotator cuff   Caffeine use: coffee daily and pepsi   The PMH, PSH, Social History, Family History, Medications, and allergies have been reviewed in [facility], and have been updated if relevant.   Review of Systems  Constitutional: Positive for fatigue. Negative for fever.  Gastrointestinal: Positive for vomiting, abdominal pain and diarrhea. Negative for nausea and blood in stool.  Genitourinary: Negative.   All other systems reviewed and are negative.      Objective:   Physical Exam  Constitutional: She is oriented to person, place, and time.  Appears thin, tired today  HENT:  Head: Normocephalic.  Abdominal: Soft.  Neurological: She is alert and oriented to person, place, and time.  Skin: Skin is warm and dry.  Psychiatric: She has a normal mood and affect. Her behavior is normal. Thought content normal.  Nursing note and vitals reviewed.  BP 144/82 mmHg  Pulse 68  Temp(Src) 98.2 F (36.8 C) (Oral)  Wt 138 lb 4 oz (62.71 kg)  SpO2 96% Wt Readings from Last 3 Encounters:  06/12/14 138 lb 4 oz (62.71 kg)  05/23/14 141 lb (63.957 kg)  05/17/14 140 lb 12 oz (63.844 kg)          Assessment & Plan:

## 2014-06-13 ENCOUNTER — Telehealth: Payer: Self-pay | Admitting: Family Medicine

## 2014-06-13 DIAGNOSIS — R197 Diarrhea, unspecified: Secondary | ICD-10-CM

## 2014-06-13 NOTE — Telephone Encounter (Signed)
Pt called stating the dr at hospital wanted pt to have a c-diff test done and pt wants to know if she could come here to get the test kit. Her daughter wants to pick up test today

## 2014-06-13 NOTE — Telephone Encounter (Signed)
I ordered that test on 10/30 and it was negative.  How is she feeling today?

## 2014-06-13 NOTE — Telephone Encounter (Signed)
Pt states that she will contact her daughter and inform her that she has had previous test completed and will contact us should she decide to repeat it

## 2014-06-13 NOTE — Telephone Encounter (Signed)
We can certainly recheck c diff if she would like.

## 2014-06-13 NOTE — Telephone Encounter (Signed)
Spoke to pt and advised that she has already had cdiff test completed, and was advised of results. Pt states that she was seen at [facility] on 11/23 and "is not feeling any better." She was given "a bag of fluids," and magnesium while at the ED. She states the Dr at the ED has questions if [facility] had turned into CDiff. Pt is questioning if she is needing to repeat test. Records requested from [facility]

## 2014-06-14 DIAGNOSIS — R197 Diarrhea, unspecified: Secondary | ICD-10-CM | POA: Diagnosis not present

## 2014-06-14 NOTE — Addendum Note (Signed)
Addended by: Ellamae Sia on: 06/14/2014 04:55 PM   Modules accepted: Orders

## 2014-06-15 LAB — C. DIFFICILE GDH AND TOXIN A/B
C. difficile GDH: DETECTED — AB
C. difficile Toxin A/B: NOT DETECTED

## 2014-06-15 LAB — CLOSTRIDIUM DIFFICILE BY PCR: Toxigenic C. Difficile by PCR: DETECTED — CR

## 2014-06-16 ENCOUNTER — Telehealth: Payer: Self-pay | Admitting: *Deleted

## 2014-06-16 MED ORDER — METRONIDAZOLE 500 MG PO TABS: 500.0000 mg | ORAL_TABLET | Freq: Three times a day (TID) | ORAL | Status: DC

## 2014-06-16 NOTE — Telephone Encounter (Signed)
plz notify C diff test returned positive. Will treat with flagyl course 500mg  tid x 14 days - if not improving on this please let us know as may need different antibiotic (she was on shorter flagyl course earlier in the month).

## 2014-06-16 NOTE — Telephone Encounter (Signed)
Received call report from [facility] lab, that c dif was detected in pt's specimen.

## 2014-06-16 NOTE — Telephone Encounter (Signed)
Patient notified. Advised her that C-Diff was very contagious and to avoid contact with people if at all possible and to be diligent about bleaching surfaces after use, using a separate restroom if possible and hand washing. She verbalized understanding.

## 2014-06-29 ENCOUNTER — Encounter: Payer: Medicare Other | Admitting: Family Medicine

## 2014-07-07 ENCOUNTER — Telehealth: Payer: Self-pay | Admitting: Internal Medicine

## 2014-07-07 NOTE — Telephone Encounter (Signed)
Patient called and said she was diagnosed with c-diff.  Patient said she's not having anymore symptoms.  Patient's 66 year old grandson is coming to visit and she wants to know if she could still be contagious?

## 2014-07-07 NOTE — Telephone Encounter (Signed)
Pt states she does want to get retested--look at telephone note from Dr Deborra Medina stated she was okay with pt having c diff stool test again

## 2014-07-07 NOTE — Telephone Encounter (Signed)
She is considered contagious until she has a negative c diff stool

## 2014-07-10 ENCOUNTER — Other Ambulatory Visit: Payer: Medicare Other

## 2014-07-10 DIAGNOSIS — R197 Diarrhea, unspecified: Secondary | ICD-10-CM

## 2014-07-11 ENCOUNTER — Telehealth: Payer: Self-pay | Admitting: Family Medicine

## 2014-07-11 LAB — C. DIFFICILE GDH AND TOXIN A/B
C. difficile GDH: NOT DETECTED
C. difficile Toxin A/B: NOT DETECTED

## 2014-07-11 NOTE — Telephone Encounter (Signed)
Pt request call back with c diff results  [phone]

## 2014-07-21 HISTORY — PX: CARDIAC CATHETERIZATION: SHX172

## 2014-07-25 ENCOUNTER — Telehealth: Payer: Self-pay

## 2014-07-25 ENCOUNTER — Encounter: Payer: Medicare Other | Admitting: Gastroenterology

## 2014-07-25 NOTE — Telephone Encounter (Signed)
-----   Message from Marvel Plan sent at 07/24/2014 12:54 PM EST ----- Pt has cancelled her procedure that was scheduled for 07/25/14 with Dr. Deatra Ina.  She states she had e. Coli and C. Diff and has been violently ill.  Will call back to schedule another procedure.

## 2014-07-25 NOTE — Telephone Encounter (Signed)
ok 

## 2014-07-25 NOTE — Telephone Encounter (Signed)
Procedure cancelled.

## 2014-08-01 ENCOUNTER — Ambulatory Visit (INDEPENDENT_AMBULATORY_CARE_PROVIDER_SITE_OTHER): Payer: Medicare Other | Admitting: Internal Medicine

## 2014-08-01 ENCOUNTER — Encounter: Payer: Self-pay | Admitting: Internal Medicine

## 2014-08-01 ENCOUNTER — Observation Stay: Payer: Self-pay | Admitting: Internal Medicine

## 2014-08-01 VITALS — BP 110/82 | HR 72 | Temp 98.1°F | Wt 138.0 lb

## 2014-08-01 DIAGNOSIS — E785 Hyperlipidemia, unspecified: Secondary | ICD-10-CM | POA: Diagnosis not present

## 2014-08-01 DIAGNOSIS — R0789 Other chest pain: Secondary | ICD-10-CM | POA: Diagnosis not present

## 2014-08-01 DIAGNOSIS — K3184 Gastroparesis: Secondary | ICD-10-CM | POA: Diagnosis not present

## 2014-08-01 DIAGNOSIS — R5383 Other fatigue: Secondary | ICD-10-CM

## 2014-08-01 DIAGNOSIS — I358 Other nonrheumatic aortic valve disorders: Secondary | ICD-10-CM | POA: Diagnosis not present

## 2014-08-01 DIAGNOSIS — N289 Disorder of kidney and ureter, unspecified: Secondary | ICD-10-CM | POA: Diagnosis not present

## 2014-08-01 DIAGNOSIS — I350 Nonrheumatic aortic (valve) stenosis: Secondary | ICD-10-CM

## 2014-08-01 DIAGNOSIS — Z955 Presence of coronary angioplasty implant and graft: Secondary | ICD-10-CM | POA: Diagnosis not present

## 2014-08-01 DIAGNOSIS — J449 Chronic obstructive pulmonary disease, unspecified: Secondary | ICD-10-CM | POA: Diagnosis not present

## 2014-08-01 DIAGNOSIS — E039 Hypothyroidism, unspecified: Secondary | ICD-10-CM | POA: Diagnosis not present

## 2014-08-01 DIAGNOSIS — R079 Chest pain, unspecified: Secondary | ICD-10-CM

## 2014-08-01 DIAGNOSIS — R9431 Abnormal electrocardiogram [ECG] [EKG]: Secondary | ICD-10-CM | POA: Diagnosis not present

## 2014-08-01 DIAGNOSIS — Z800 Family history of malignant neoplasm of digestive organs: Secondary | ICD-10-CM | POA: Diagnosis not present

## 2014-08-01 DIAGNOSIS — F0150 Vascular dementia without behavioral disturbance: Secondary | ICD-10-CM | POA: Diagnosis not present

## 2014-08-01 DIAGNOSIS — I252 Old myocardial infarction: Secondary | ICD-10-CM | POA: Diagnosis not present

## 2014-08-01 DIAGNOSIS — Z794 Long term (current) use of insulin: Secondary | ICD-10-CM | POA: Diagnosis not present

## 2014-08-01 DIAGNOSIS — Z823 Family history of stroke: Secondary | ICD-10-CM | POA: Diagnosis not present

## 2014-08-01 DIAGNOSIS — R0602 Shortness of breath: Secondary | ICD-10-CM | POA: Diagnosis not present

## 2014-08-01 DIAGNOSIS — F17200 Nicotine dependence, unspecified, uncomplicated: Secondary | ICD-10-CM | POA: Diagnosis not present

## 2014-08-01 DIAGNOSIS — I25110 Atherosclerotic heart disease of native coronary artery with unstable angina pectoris: Secondary | ICD-10-CM | POA: Diagnosis not present

## 2014-08-01 DIAGNOSIS — Z7982 Long term (current) use of aspirin: Secondary | ICD-10-CM | POA: Diagnosis not present

## 2014-08-01 DIAGNOSIS — E781 Pure hyperglyceridemia: Secondary | ICD-10-CM | POA: Diagnosis not present

## 2014-08-01 DIAGNOSIS — K219 Gastro-esophageal reflux disease without esophagitis: Secondary | ICD-10-CM | POA: Diagnosis not present

## 2014-08-01 DIAGNOSIS — K319 Disease of stomach and duodenum, unspecified: Secondary | ICD-10-CM | POA: Diagnosis not present

## 2014-08-01 DIAGNOSIS — Z8249 Family history of ischemic heart disease and other diseases of the circulatory system: Secondary | ICD-10-CM | POA: Diagnosis not present

## 2014-08-01 DIAGNOSIS — I10 Essential (primary) hypertension: Secondary | ICD-10-CM | POA: Diagnosis not present

## 2014-08-01 DIAGNOSIS — E1143 Type 2 diabetes mellitus with diabetic autonomic (poly)neuropathy: Secondary | ICD-10-CM | POA: Diagnosis not present

## 2014-08-01 DIAGNOSIS — M549 Dorsalgia, unspecified: Secondary | ICD-10-CM | POA: Diagnosis not present

## 2014-08-01 DIAGNOSIS — I2510 Atherosclerotic heart disease of native coronary artery without angina pectoris: Secondary | ICD-10-CM | POA: Diagnosis not present

## 2014-08-01 LAB — PRO B NATRIURETIC PEPTIDE: B-Type Natriuretic Peptide: 91 pg/mL (ref 0–125)

## 2014-08-01 LAB — CBC
HCT: 37.3 % (ref 35.0–47.0)
HGB: 12.2 g/dL (ref 12.0–16.0)
MCH: 31.7 pg (ref 26.0–34.0)
MCHC: 32.8 g/dL (ref 32.0–36.0)
MCV: 97 fL (ref 80–100)
Platelet: 279 10*3/uL (ref 150–440)
RBC: 3.86 10*6/uL (ref 3.80–5.20)
RDW: 15.4 % — ABNORMAL HIGH (ref 11.5–14.5)
WBC: 8.2 10*3/uL (ref 3.6–11.0)

## 2014-08-01 LAB — BASIC METABOLIC PANEL
Anion Gap: 8 (ref 7–16)
BUN: 13 mg/dL (ref 7–18)
Calcium, Total: 8.8 mg/dL (ref 8.5–10.1)
Chloride: 96 mmol/L — ABNORMAL LOW (ref 98–107)
Co2: 29 mmol/L (ref 21–32)
Creatinine: 1.33 mg/dL — ABNORMAL HIGH (ref 0.60–1.30)
EGFR (African American): 51 — ABNORMAL LOW
EGFR (Non-African Amer.): 42 — ABNORMAL LOW
Glucose: 299 mg/dL — ABNORMAL HIGH (ref 65–99)
Osmolality: 278 (ref 275–301)
Potassium: 3.5 mmol/L (ref 3.5–5.1)
Sodium: 133 mmol/L — ABNORMAL LOW (ref 136–145)

## 2014-08-01 LAB — TROPONIN I
Troponin-I: <0.02 ng/mL
Troponin-I: <0.02 ng/mL
Troponin-I: <0.02 ng/mL

## 2014-08-01 LAB — PROTIME-INR
INR: 0.9
Prothrombin Time: 12.2 secs (ref 11.5–14.7)

## 2014-08-01 LAB — CK TOTAL AND CKMB (NOT AT ARMC)
CK, Total: 273 U/L — ABNORMAL HIGH (ref 26–192)
CK, Total: 264 U/L — ABNORMAL HIGH (ref 26–192)
CK, Total: 272 U/L — ABNORMAL HIGH (ref 26–192)
CK-MB: 4.9 ng/mL — ABNORMAL HIGH (ref 0.5–3.6)
CK-MB: 4.3 ng/mL — ABNORMAL HIGH (ref 0.5–3.6)
CK-MB: 4.5 ng/mL — ABNORMAL HIGH (ref 0.5–3.6)

## 2014-08-01 LAB — LIPASE, BLOOD: Lipase: 217 U/L (ref 73–393)

## 2014-08-01 NOTE — Patient Instructions (Signed)

## 2014-08-01 NOTE — Progress Notes (Signed)
Subjective:    Patient ID: Sarah Payne, female    DOB: 02/07/1946, 69 y.o.   MRN: [id number]  HPI  Pt presents to the clinic today with c/o fatigue. She reports she was treated for [facility] 05/2014. She did have a negative Cdiff 07/10/14. Her stool has returned to normal. Her appetite is normal. She is nauseated at times. But she can't seem to get over the fatigue. She also reports chest pain that radiates to the middle of her back. This comes and goes. She also c/o worsening shortness of breath, with exertion. She is not on oxygen. She does have CHF, COPD, DM2, CAD. She has a follow up with her cardiologist next week.  Review of Systems      Past Medical History  Diagnosis Date  . Diabetes mellitus   . Hypertension   . Hypothyroidism   . Depression   . Vascular dementia   . Tobacco abuse     a. 45 pack year history - quit 09/16/11  . Chronic low back pain   . Coronary artery disease     a. s/p PCI/DES LCX x 3;  b. NSTEMI 09/16/11;  c.   09/17/11 [facility]) Severe RCA/LCX dzs - RCA treated, LCX intervention pending w/ Dr. Clayborn Bigness  . Sleep apnea     mild-does not use cpap  . Impingement syndrome of left shoulder   . Tendonitis of left rotator cuff   . Diverticulosis     Current Outpatient Prescriptions  Medication Sig Dispense Refill  . albuterol (PROVENTIL HFA;VENTOLIN HFA) 108 (90 BASE) MCG/ACT inhaler Inhale 2 puffs into the lungs every 6 (six) hours as needed for wheezing. 1 Inhaler 11  . aspirin 81 MG tablet Take 81 mg by mouth daily.      Marland Kitchen atorvastatin (LIPITOR) 40 MG tablet Take 1 tablet (40 mg total) by mouth daily. 90 tablet 3  . Calcium & Magnesium Carbonates (MYLANTA PO) Take by mouth.      . carvedilol (COREG) 6.25 MG tablet Take 1 tablet (6.25 mg total) by mouth 2 (two) times daily. 180 tablet 3  . clobetasol ointment (TEMOVATE) 0.05 % APPLY EVERY NIGHT TO VAGINAL FOLDS 60 g 1  . clonazePAM (KLONOPIN) 0.5 MG tablet Take 1 tablet (0.5 mg total) by mouth at bedtime  as needed for anxiety. 20 tablet 1  . clopidogrel (PLAVIX) 75 MG tablet TAKE 1 TABLET (75 MG TOTAL) BY MOUTH DAILY. 90 tablet 3  . cyanocobalamin (,VITAMIN B-12,) 1000 MCG/ML injection every 30 (thirty) days. INJECT 1 ML (1,000 MCG TOTAL) INTO THE MUSCLE ONCE.    Marland Kitchen diazepam (VALIUM) 2 MG tablet Take 1 tablet (2 mg total) by mouth every 8 (eight) hours as needed for muscle spasms. 20 tablet 0  . DULoxetine (CYMBALTA) 60 MG capsule TAKE 1 CAPSULE BY MOUTH DAILY. 90 capsule 3  . enalapril (VASOTEC) 10 MG tablet Take 1 tablet (10 mg total) by mouth daily. 90 tablet 3  . etodolac (LODINE) 400 MG tablet Take 1 tablet (400 mg total) by mouth 2 (two) times daily. 180 tablet 1  . fluticasone (FLOVENT HFA) 220 MCG/ACT inhaler Inhale 1 puff into the lungs 2 (two) times daily. 2 Inhaler 1  . gabapentin (NEURONTIN) 100 MG capsule Take 1 capsule (100 mg total) by mouth 3 (three) times daily. 270 capsule 1  . glipiZIDE (GLUCOTROL) 10 MG tablet Before breakfast and supper (evening meal) 180 tablet 1  . glucose blood (FREESTYLE LITE) test strip Check blood sugar  3 times daily to check blood sugars - Uncontrolled diabetes mellitus 300 each 3  . glucose monitoring kit (FREESTYLE) monitoring kit 1 each by Does not apply route as needed for other. PHARMACY PLEASE SUBSTITUTE WHATEVER GLUCOMETER YOU CARRY 1 each 1  . isosorbide mononitrate (IMDUR) 30 MG 24 hr tablet Take 1 tablet (30 mg total) by mouth daily. 90 tablet 3  . Levothyroxine Sodium 137 MCG CAPS Take 1 capsule (137 mcg total) by mouth daily before breakfast. 30 capsule 0  . magnesium oxide (MAG-OX) 400 MG tablet Take 1 tablet (400 mg total) by mouth daily. 90 tablet 1  . metFORMIN (GLUCOPHAGE) 500 MG tablet Take 2 tablets (1,000 mg total) by mouth 2 (two) times daily with a meal. 180 tablet 3  . Na Sulfate-K Sulfate-Mg Sulf (SUPREP BOWEL PREP) SOLN USE PER PREP INSTRUCTIONS 1 Bottle 0  . nitroGLYCERIN (NITROSTAT) 0.4 MG SL tablet Place 1 tablet (0.4 mg total)  under the tongue every 5 (five) minutes as needed. 25 tablet 2  . omeprazole (PRILOSEC) 20 MG capsule Take 1 capsule (20 mg total) by mouth 2 (two) times daily before a meal. 60 capsule 11  . ondansetron (ZOFRAN-ODT) 8 MG disintegrating tablet Take 1 tablet (8 mg total) by mouth every 8 (eight) hours as needed for nausea. 20 tablet 0  . oxyCODONE (ROXICODONE) 5 MG immediate release tablet 1-2 tablets every 4-6 hrs as needed for pain 60 tablet 0  . sitaGLIPtin (JANUVIA) 100 MG tablet Take 1 tablet (100 mg total) by mouth daily. 90 tablet 3  . tiotropium (SPIRIVA HANDIHALER) 18 MCG inhalation capsule Place 1 capsule (18 mcg total) into inhaler and inhale daily. 30 capsule 2  . traZODone (DESYREL) 100 MG tablet TAKE 1 TABLET (100 MG TOTAL) BY MOUTH AT BEDTIME. 90 tablet 3  . donepezil (ARICEPT) 5 MG tablet Take 1 tablet (5 mg total) by mouth at bedtime. 90 tablet 1   No current facility-administered medications for this visit.    Allergies  Allergen Reactions  . No Known Allergies     Family History  Problem Relation Age of Onset  . Heart attack Mother     First MI @ 39 - Died @ 66  . Heart disease Mother   . Heart disease Father     Died @ 65  . Throat cancer Brother     History   Social History  . Marital Status: Married    Spouse Name: N/A    Number of Children: N/A  . Years of Education: N/A   Occupational History  . Not on file.   Social History Main Topics  . Smoking status: Current Every Day Smoker -- 0.50 packs/day for 45 years    Types: Cigarettes  . Smokeless tobacco: Never Used     Comment: Has cut back, trying to quit.   . Alcohol Use: No  . Drug Use: Yes    Special: Marijuana     Comment: smoked a "couple of nights ago"  . Sexual Activity: Not on file   Other Topics Concern  . Not on file   Social History Narrative   Lives at home with her husband in [place].  Previously used marijuana - quit.      Regular exercise: no/ pain from a frozen rotator  cuff   Caffeine use: coffee daily and pepsi     Constitutional: Pt reports fatigue. Denies fever, malaise, headache or abrupt weight changes.  Respiratory: Pt reports shortness of breath. Denies difficulty  breathing, cough or sputum production.   Cardiovascular: Pt reports chest pain. Denies chest tightness, palpitations or swelling in the hands or feet.  Gastrointestinal: Denies abdominal pain, bloating, constipation, diarrhea or blood in the stool.   Skin: Denies redness, rashes, lesions or ulcercations.  Neurological: Denies dizziness, difficulty with memory, difficulty with speech or problems with balance and coordination.   No other specific complaints in a complete review of systems (except as listed in HPI above).  Objective:   Physical Exam   BP 110/82 mmHg  Pulse 72  Temp(Src) 98.1 F (36.7 C) (Oral)  Wt 138 lb (62.596 kg)  SpO2 98% Wt Readings from Last 3 Encounters:  08/01/14 138 lb (62.596 kg)  06/12/14 138 lb 4 oz (62.71 kg)  05/23/14 141 lb (63.957 kg)    General: Appears her stated age, chronically ill appearing in NAD. Skin: Warm, dry and intact.  Cardiovascular: Normal rate and rhythm. S1,S2 noted.  No murmur, rubs or gallops noted. No JVD or BLE edema. Pulmonary/Chest: Normal effort and positive vesicular breath sounds. No respiratory distress. No wheezes, rales or ronchi noted.  Neurological: Alert and oriented.    BMET    Component Value Date/Time   NA 136 05/17/2014 1414   NA 134 08/11/2012 1603   K 3.8 05/17/2014 1414   CL 103 05/17/2014 1414   CO2 25 05/17/2014 1414   GLUCOSE 194* 05/17/2014 1414   GLUCOSE 166* 08/11/2012 1603   BUN 8 05/17/2014 1414   BUN 17 08/11/2012 1603   CREATININE 1.0 05/17/2014 1414   CREATININE 1.07 11/24/2011 1541   CALCIUM 9.1 05/17/2014 1414   GFRNONAA 68 08/11/2012 1603   GFRNONAA 55* 11/24/2011 1541   GFRAA 78 08/11/2012 1603   GFRAA 64 11/24/2011 1541    Lipid Panel     Component Value Date/Time   CHOL  287* 04/26/2014 0801   TRIG * 04/26/2014 0801    425.0 Triglyceride is over 400; calculations on Lipids are invalid.   HDL 34.30* 04/26/2014 0801   CHOLHDL 8 04/26/2014 0801   VLDL 85.0* 04/26/2014 0801   LDLCALC 70 04/30/2011 1341    CBC    Component Value Date/Time   WBC 9.1 05/17/2014 1414   WBC 12.1* 08/11/2012 1603   RBC 3.97 05/17/2014 1414   RBC 4.30 08/11/2012 1603   HGB 12.1 05/17/2014 1414   HCT 36.8 05/17/2014 1414   PLT 243.0 05/17/2014 1414   MCV 92.7 05/17/2014 1414   MCH 32.6 08/11/2012 1603   MCHC 33.0 05/17/2014 1414   MCHC 34.7 08/11/2012 1603   RDW 14.9 05/17/2014 1414   RDW 13.5 08/11/2012 1603   LYMPHSABS 1.9 05/17/2014 1414   LYMPHSABS 2.9 08/11/2012 1603   MONOABS 0.6 05/17/2014 1414   EOSABS 0.1 05/17/2014 1414   EOSABS 0.1 08/11/2012 1603   BASOSABS 0.0 05/17/2014 1414   BASOSABS 0.0 08/11/2012 1603    Hgb A1C Lab Results  Component Value Date   HGBA1C 8.8* 04/26/2014        Assessment & Plan:  Fatigue, chest pain and SOB:  Will check ECG today- new t wave inversions in V1-V6, PVC's noted, questionable bundle branch block. Discussed case with Dr. Silvio Pate and Dr. Deborra Medina- they agree to call [phone]  and have pt transferred to [facility].

## 2014-08-01 NOTE — Progress Notes (Signed)
Pre visit review using our clinic review tool, if applicable. No additional management support is needed unless otherwise documented below in the visit note. 

## 2014-08-02 DIAGNOSIS — E785 Hyperlipidemia, unspecified: Secondary | ICD-10-CM | POA: Diagnosis not present

## 2014-08-02 DIAGNOSIS — I200 Unstable angina: Secondary | ICD-10-CM | POA: Diagnosis not present

## 2014-08-02 DIAGNOSIS — E119 Type 2 diabetes mellitus without complications: Secondary | ICD-10-CM | POA: Diagnosis not present

## 2014-08-02 DIAGNOSIS — E039 Hypothyroidism, unspecified: Secondary | ICD-10-CM | POA: Diagnosis not present

## 2014-08-02 DIAGNOSIS — R079 Chest pain, unspecified: Secondary | ICD-10-CM | POA: Diagnosis not present

## 2014-08-02 HISTORY — PX: LEFT HEART CATH AND CORONARY ANGIOGRAPHY: CATH118249

## 2014-08-02 LAB — CBC WITH DIFFERENTIAL/PLATELET
Basophil #: 0 10*3/uL (ref 0.0–0.1)
Basophil %: 0.5 %
Eosinophil #: 0 10*3/uL (ref 0.0–0.7)
Eosinophil %: 0.8 %
HCT: 37 % (ref 35.0–47.0)
HGB: 12.2 g/dL (ref 12.0–16.0)
Lymphocyte #: 2.4 10*3/uL (ref 1.0–3.6)
Lymphocyte %: 39.6 %
MCH: 31.8 pg (ref 26.0–34.0)
MCHC: 33 g/dL (ref 32.0–36.0)
MCV: 96 fL (ref 80–100)
Monocyte #: 0.5 x10 3/mm (ref 0.2–0.9)
Monocyte %: 7.9 %
Neutrophil #: 3.1 10*3/uL (ref 1.4–6.5)
Neutrophil %: 51.2 %
Platelet: 234 10*3/uL (ref 150–440)
RBC: 3.84 10*6/uL (ref 3.80–5.20)
RDW: 15.6 % — ABNORMAL HIGH (ref 11.5–14.5)
WBC: 6 10*3/uL (ref 3.6–11.0)

## 2014-08-02 LAB — BASIC METABOLIC PANEL
Anion Gap: 8 (ref 7–16)
BUN: 15 mg/dL (ref 7–18)
Calcium, Total: 8.5 mg/dL (ref 8.5–10.1)
Chloride: 100 mmol/L (ref 98–107)
Co2: 28 mmol/L (ref 21–32)
Creatinine: 1.24 mg/dL (ref 0.60–1.30)
EGFR (African American): 56 — ABNORMAL LOW
EGFR (Non-African Amer.): 46 — ABNORMAL LOW
Glucose: 137 mg/dL — ABNORMAL HIGH (ref 65–99)
Osmolality: 275 (ref 275–301)
Potassium: 3.1 mmol/L — ABNORMAL LOW (ref 3.5–5.1)
Sodium: 136 mmol/L (ref 136–145)

## 2014-08-02 LAB — TSH: Thyroid Stimulating Horm: >100 u[IU]/mL

## 2014-08-02 LAB — HEMOGLOBIN A1C: Hemoglobin A1C: 10.9 % — ABNORMAL HIGH (ref 4.2–6.3)

## 2014-08-02 LAB — LIPID PANEL
Cholesterol: 292 mg/dL — ABNORMAL HIGH (ref 0–200)
HDL Cholesterol: 34 mg/dL — ABNORMAL LOW (ref 40–60)
Ldl Cholesterol, Calc: 180 mg/dL — ABNORMAL HIGH (ref 0–100)
Triglycerides: 388 mg/dL — ABNORMAL HIGH (ref 0–200)
VLDL Cholesterol, Calc: 78 mg/dL — ABNORMAL HIGH (ref 5–40)

## 2014-08-02 LAB — T4, FREE: Free Thyroxine: 0.18 ng/dL — ABNORMAL LOW (ref 0.76–1.46)

## 2014-08-03 ENCOUNTER — Encounter: Payer: Self-pay | Admitting: Cardiology

## 2014-08-07 ENCOUNTER — Ambulatory Visit: Payer: Self-pay | Admitting: Internal Medicine

## 2014-08-07 DIAGNOSIS — E119 Type 2 diabetes mellitus without complications: Secondary | ICD-10-CM | POA: Diagnosis not present

## 2014-08-08 ENCOUNTER — Ambulatory Visit (INDEPENDENT_AMBULATORY_CARE_PROVIDER_SITE_OTHER): Payer: Medicare Other | Admitting: Cardiovascular Disease

## 2014-08-08 ENCOUNTER — Encounter: Payer: Self-pay | Admitting: Cardiovascular Disease

## 2014-08-08 VITALS — BP 90/64 | HR 58 | Ht 66.0 in | Wt 139.0 lb

## 2014-08-08 DIAGNOSIS — E1151 Type 2 diabetes mellitus with diabetic peripheral angiopathy without gangrene: Secondary | ICD-10-CM

## 2014-08-08 DIAGNOSIS — I951 Orthostatic hypotension: Secondary | ICD-10-CM | POA: Diagnosis not present

## 2014-08-08 DIAGNOSIS — E1159 Type 2 diabetes mellitus with other circulatory complications: Secondary | ICD-10-CM

## 2014-08-08 DIAGNOSIS — I2510 Atherosclerotic heart disease of native coronary artery without angina pectoris: Secondary | ICD-10-CM | POA: Diagnosis not present

## 2014-08-08 DIAGNOSIS — E785 Hyperlipidemia, unspecified: Secondary | ICD-10-CM | POA: Diagnosis not present

## 2014-08-08 DIAGNOSIS — J4489 Other specified chronic obstructive pulmonary disease: Secondary | ICD-10-CM

## 2014-08-08 DIAGNOSIS — Z720 Tobacco use: Secondary | ICD-10-CM

## 2014-08-08 DIAGNOSIS — R079 Chest pain, unspecified: Secondary | ICD-10-CM | POA: Diagnosis not present

## 2014-08-08 DIAGNOSIS — I739 Peripheral vascular disease, unspecified: Secondary | ICD-10-CM

## 2014-08-08 DIAGNOSIS — J449 Chronic obstructive pulmonary disease, unspecified: Secondary | ICD-10-CM

## 2014-08-08 MED ORDER — ENALAPRIL MALEATE 5 MG PO TABS: 5.0000 mg | ORAL_TABLET | Freq: Every day | ORAL | Status: DC

## 2014-08-08 MED ORDER — CARVEDILOL 3.125 MG PO TABS: 6.2500 mg | ORAL_TABLET | Freq: Two times a day (BID) | ORAL | Status: DC

## 2014-08-08 NOTE — Assessment & Plan Note (Signed)
Blood pressure very low today. We have recommended that she decrease the carvedilol down to 3.125 mill grams twice a day, decrease the enalapril down to 5 mg daily. If blood pressure continues to run low, suggested that she call the office.

## 2014-08-08 NOTE — Assessment & Plan Note (Signed)
Cholesterol poorly controlled likely secondary to medication noncompliance. Stressed the importance of staying on her statin, goal LDL less than 70. Better diabetes control will help. This was discussed with her

## 2014-08-08 NOTE — Patient Instructions (Addendum)
You are doing well.  Work on sugars Stay on the atorvastatin 40 mg daily Try to quit smoking  Cut the down the enalapril down to 5 mg a day (1/2 pill) Cut the coreg down to 3.125 mg twice a day (1/2 pill twice a day)  Please call us if you have new issues that need to be addressed before your next appt.  Your physician wants you to follow-up in: 6 months.  You will receive a reminder letter in the mail two months in advance. If you don't receive a letter, please call our office to schedule the follow-up appointment.

## 2014-08-08 NOTE — Progress Notes (Signed)
Patient ID: Sarah Payne, female    DOB: 11-02-1945, 69 y.o.   MRN: [id number]  HPI Comments: 69 year old woman with a long history of smoking, coronary artery disease, prior stent x3 placed to the mid left circumflex with moderate to severe LAD disease , stent placed to her distal RCA in February 2013, stent to the mid left circumflex in March 2013 , poorly controlled diabetes with hemoglobin A1c of 10, depression, hypertension, cardiac catheterization 08/19/2012 showing moderate mid LAD disease at the takeoff of the diagonal vessel, severe ostial to mid RCA disease, ejection fraction greater than 55%. She had FFR pressure wire showing severe disease of the RCA with drug-eluting stent x2 placed. Recent C. difficile late 2015 after antibiotics Previous history of running out of her medications  She presents today for follow-up of her coronary artery disease  Recently in the hospital January 12, discharged 08/02/2014. Diagnosed with unstable angina. Had cardiac catheterization showing severe stable coronary disease, Echocardiogram with normal ejection fraction Total cholesterol 292  Since her discharge, she denies any further chest pain. She reports that she is taking her medications. Recently been to diabetes instruction classes, scheduled to start insulin. She has been compliant with her statin per the patient She has developed a cough since her discharge from the hospital. She does not want antibiotics given prior history of C. Difficile at the end of 2015  EKG on today's visit shows normal sinus rhythm with rate 58 bpm, no significant ST abnormality, there are T-wave abnormality anterolateral leads  Details of the echocardiogram 08/01/2014; ejection fraction 55-60%, normal right ventricular systolic pressure, normal right heart systolic function Catheterization details, unchanged from prior catheterization. Lab report has not been signed off in the computer. Catheterization by Dr. Ellyn Hack.  Medical management was recommended  Previous Lab work showing hemoglobin A1c 8.8, total cholesterol 287, LDL 174  Echocardiogram February 07, 2011 shows normal LV systolic function, diastolic dysfunction, moderate inferior wall hypokinesis, normal right ventricular systolic pressures.  Allergies  Allergen Reactions  . No Known Allergies     Outpatient Encounter Prescriptions as of 08/08/2014  Medication Sig  . albuterol (PROVENTIL HFA;VENTOLIN HFA) 108 (90 BASE) MCG/ACT inhaler Inhale 2 puffs into the lungs every 6 (six) hours as needed for wheezing.  Marland Kitchen aspirin 81 MG tablet Take 81 mg by mouth daily.    Marland Kitchen atorvastatin (LIPITOR) 40 MG tablet Take 1 tablet (40 mg total) by mouth daily.  . Calcium & Magnesium Carbonates (MYLANTA PO) Take by mouth.    . carvedilol (COREG) 6.25 MG tablet Take 1 tablet (6.25 mg total) by mouth 2 (two) times daily.  . clobetasol ointment (TEMOVATE) 0.05 % APPLY EVERY NIGHT TO VAGINAL FOLDS  . clonazePAM (KLONOPIN) 0.5 MG tablet Take 1 tablet (0.5 mg total) by mouth at bedtime as needed for anxiety.  . clopidogrel (PLAVIX) 75 MG tablet TAKE 1 TABLET (75 MG TOTAL) BY MOUTH DAILY.  . cyanocobalamin (,VITAMIN B-12,) 1000 MCG/ML injection every 30 (thirty) days. INJECT 1 ML (1,000 MCG TOTAL) INTO THE MUSCLE ONCE.  Marland Kitchen diazepam (VALIUM) 2 MG tablet Take 1 tablet (2 mg total) by mouth every 8 (eight) hours as needed for muscle spasms.  Marland Kitchen donepezil (ARICEPT) 5 MG tablet Take 1 tablet (5 mg total) by mouth at bedtime.  . DULoxetine (CYMBALTA) 60 MG capsule TAKE 1 CAPSULE BY MOUTH DAILY.  Marland Kitchen enalapril (VASOTEC) 10 MG tablet Take 1 tablet (10 mg total) by mouth daily.  Marland Kitchen etodolac (LODINE) 400 MG tablet Take  1 tablet (400 mg total) by mouth 2 (two) times daily.  . fluticasone (FLOVENT HFA) 220 MCG/ACT inhaler Inhale 1 puff into the lungs 2 (two) times daily.  Marland Kitchen gabapentin (NEURONTIN) 100 MG capsule Take 1 capsule (100 mg total) by mouth 3 (three) times daily.  Marland Kitchen glipiZIDE  (GLUCOTROL) 10 MG tablet Before breakfast and supper (evening meal)  . glucose blood (FREESTYLE LITE) test strip Check blood sugar 3 times daily to check blood sugars - Uncontrolled diabetes mellitus  . glucose monitoring kit (FREESTYLE) monitoring kit 1 each by Does not apply route as needed for other. PHARMACY PLEASE SUBSTITUTE WHATEVER GLUCOMETER YOU CARRY  . isosorbide mononitrate (IMDUR) 30 MG 24 hr tablet Take 1 tablet (30 mg total) by mouth daily.  . Levothyroxine Sodium 137 MCG CAPS Take 1 capsule (137 mcg total) by mouth daily before breakfast.  . magnesium oxide (MAG-OX) 400 MG tablet Take 1 tablet (400 mg total) by mouth daily.  . metFORMIN (GLUCOPHAGE) 500 MG tablet Take 2 tablets (1,000 mg total) by mouth 2 (two) times daily with a meal.  . Na Sulfate-K Sulfate-Mg Sulf (SUPREP BOWEL PREP) SOLN USE PER PREP INSTRUCTIONS  . nitroGLYCERIN (NITROSTAT) 0.4 MG SL tablet Place 1 tablet (0.4 mg total) under the tongue every 5 (five) minutes as needed.  Marland Kitchen omeprazole (PRILOSEC) 20 MG capsule Take 1 capsule (20 mg total) by mouth 2 (two) times daily before a meal.  . ondansetron (ZOFRAN-ODT) 8 MG disintegrating tablet Take 1 tablet (8 mg total) by mouth every 8 (eight) hours as needed for nausea.  Marland Kitchen oxyCODONE (ROXICODONE) 5 MG immediate release tablet 1-2 tablets every 4-6 hrs as needed for pain  . sitaGLIPtin (JANUVIA) 100 MG tablet Take 1 tablet (100 mg total) by mouth daily.  Marland Kitchen tiotropium (SPIRIVA HANDIHALER) 18 MCG inhalation capsule Place 1 capsule (18 mcg total) into inhaler and inhale daily.  . traZODone (DESYREL) 100 MG tablet TAKE 1 TABLET (100 MG TOTAL) BY MOUTH AT BEDTIME.  . metoCLOPramide (REGLAN) 5 MG tablet Take 5 mg by mouth.   . metroNIDAZOLE (FLAGYL) 500 MG tablet Take 500 mg by mouth.     Past Medical History  Diagnosis Date  . Diabetes mellitus   . Hypertension   . Hypothyroidism   . Depression   . Vascular dementia   . Tobacco abuse     a. 45 pack year history - quit  09/16/11  . Chronic low back pain   . Coronary artery disease     a. s/p PCI/DES LCX x 3;  b. NSTEMI 09/16/11;  c.   09/17/11 Cath [facility]) Severe RCA/LCX dzs - RCA treated, LCX intervention pending w/ Dr. Clayborn Bigness  . Sleep apnea     mild-does not use cpap  . Impingement syndrome of left shoulder   . Tendonitis of left rotator cuff   . Diverticulosis     Past Surgical History  Procedure Laterality Date  . Back surgery    . Abdominal hysterectomy    . Appendectomy    . Cardiac catheterization  2013  . Coronary angioplasty  2012    stent x 3   . Coronary angioplasty with stent placement  2013  . Cardiac catheterization  2016    Social History  reports that she has been smoking Cigarettes.  She has a 11.25 pack-year smoking history. She has never used smokeless tobacco. She reports that she uses illicit drugs (Marijuana). She reports that she does not drink alcohol.  Family History family history  includes Heart attack in her mother; Heart disease in her father and mother; Throat cancer in her brother.   Review of Systems  Constitutional: Negative.   Respiratory: Positive for cough.   Cardiovascular: Negative.   Gastrointestinal: Positive for nausea and diarrhea.  Endocrine: Negative.   Musculoskeletal: Negative.   Skin: Negative.   Neurological: Negative.   Hematological: Negative.   Psychiatric/Behavioral: Negative.   All other systems reviewed and are negative.  BP 90/64 mmHg  Pulse 58  Ht _0  (1.676 m)  Wt 139 lb (63.05 kg)  BMI 22.45 kg/m2 Blood pressure low even on recheck, systolic of 80 Physical Exam  Constitutional: She is oriented to person, place, and time. She appears well-developed and well-nourished.  HENT:  Head: Normocephalic.  Nose: Nose normal.  Mouth/Throat: Oropharynx is clear and moist.  Eyes: Conjunctivae are normal. Pupils are equal, round, and reactive to light.  Neck: Normal range of motion. Neck supple. No JVD present.  Cardiovascular:  Normal rate, regular rhythm, S1 normal, S2 normal, normal heart sounds and intact distal pulses.  Exam reveals no gallop and no friction rub.   No murmur heard. Pulmonary/Chest: Effort normal and breath sounds normal. No respiratory distress. She has no wheezes. She has no rales. She exhibits no tenderness.  Abdominal: Soft. Bowel sounds are normal. She exhibits no distension. There is no tenderness.  Musculoskeletal: Normal range of motion. She exhibits no edema or tenderness.  Lymphadenopathy:    She has no cervical adenopathy.  Neurological: She is alert and oriented to person, place, and time. Coordination normal.  Skin: Skin is warm and dry. No rash noted. No erythema.  Psychiatric: She has a normal mood and affect. Her behavior is normal. Judgment and thought content normal.    Assessment and Plan  Nursing note and vitals reviewed.

## 2014-08-08 NOTE — Assessment & Plan Note (Signed)
She reports worsening cough in the past week or so, particularly since she left the hospital. She does not want antibiotics on today's visit given recent C. difficile at the end of 2015. Recommended she call if symptoms get worse

## 2014-08-08 NOTE — Assessment & Plan Note (Signed)
Stressed importance of following up with endocrine, starting her insulin as planned, goal hemoglobin A1c less than 7. Recent lab work results discussed with her

## 2014-08-08 NOTE — Assessment & Plan Note (Signed)
Recent catheterization showing severe three-vessel disease. Medical management recommended. Discussed the importance of better diabetes control, compliance with her cholesterol medication, smoking cessation

## 2014-08-08 NOTE — Assessment & Plan Note (Signed)
We have encouraged her to continue to work on weaning her cigarettes and smoking cessation. She will continue to work on this and does not want any assistance with chantix.  

## 2014-08-16 ENCOUNTER — Encounter: Payer: Self-pay | Admitting: Family Medicine

## 2014-08-16 ENCOUNTER — Ambulatory Visit (INDEPENDENT_AMBULATORY_CARE_PROVIDER_SITE_OTHER): Payer: Medicare Other | Admitting: Family Medicine

## 2014-08-16 VITALS — BP 142/82 | HR 61 | Temp 98.0°F | Wt 139.5 lb

## 2014-08-16 DIAGNOSIS — E1159 Type 2 diabetes mellitus with other circulatory complications: Secondary | ICD-10-CM | POA: Diagnosis not present

## 2014-08-16 DIAGNOSIS — I739 Peripheral vascular disease, unspecified: Secondary | ICD-10-CM

## 2014-08-16 DIAGNOSIS — F4323 Adjustment disorder with mixed anxiety and depressed mood: Secondary | ICD-10-CM

## 2014-08-16 DIAGNOSIS — E038 Other specified hypothyroidism: Secondary | ICD-10-CM

## 2014-08-16 DIAGNOSIS — E785 Hyperlipidemia, unspecified: Secondary | ICD-10-CM | POA: Diagnosis not present

## 2014-08-16 DIAGNOSIS — R634 Abnormal weight loss: Secondary | ICD-10-CM | POA: Diagnosis not present

## 2014-08-16 DIAGNOSIS — Z720 Tobacco use: Secondary | ICD-10-CM

## 2014-08-16 DIAGNOSIS — I2510 Atherosclerotic heart disease of native coronary artery without angina pectoris: Secondary | ICD-10-CM

## 2014-08-16 DIAGNOSIS — E1151 Type 2 diabetes mellitus with diabetic peripheral angiopathy without gangrene: Secondary | ICD-10-CM

## 2014-08-16 DIAGNOSIS — I951 Orthostatic hypotension: Secondary | ICD-10-CM | POA: Diagnosis not present

## 2014-08-16 LAB — TSH: TSH: 60.34 u[IU]/mL — ABNORMAL HIGH (ref 0.35–4.50)

## 2014-08-16 LAB — T4, FREE: Free T4: 0.3 ng/dL — ABNORMAL LOW (ref 0.60–1.60)

## 2014-08-16 MED ORDER — ESCITALOPRAM OXALATE 10 MG PO TABS
10.0000 mg | ORAL_TABLET | Freq: Every day | ORAL | Status: DC
Start: 2014-08-16 — End: 2015-02-01

## 2014-08-16 MED ORDER — INSULIN GLARGINE 100 UNIT/ML ~~LOC~~ SOLN: SUBCUTANEOUS | Status: DC

## 2014-08-16 MED ORDER — LEVOTHYROXINE SODIUM 150 MCG PO CAPS: 150.0000 ug | ORAL_CAPSULE | Freq: Every day | ORAL | Status: DC

## 2014-08-16 MED ORDER — CYANOCOBALAMIN 1000 MCG/ML IJ SOLN: 1000.0000 ug | INTRAMUSCULAR | Status: DC

## 2014-08-16 NOTE — Assessment & Plan Note (Signed)
Will need to recheck thyroid studies today. A1c, thyroid and lipids all poorly controlled in hospital and are likely effecting results of one another. She absolutely needs to take her synthroid as directed- she needs refilled today which I will refill but she must see endocrinology. The patient indicates understanding of these issues and agrees with the plan. Orders Placed This Encounter  Procedures  . TSH  . T4, Free

## 2014-08-16 NOTE — Assessment & Plan Note (Signed)
Poorly controlled and non compliant. Advised taking Lantus as well as her other rxs and to call Dr. Gabriel Carina today. The Sarah Payne indicates understanding of these issues and agrees with the plan.

## 2014-08-16 NOTE — Assessment & Plan Note (Signed)
Encouraged her to continue cutting back and then quit smoking.

## 2014-08-16 NOTE — Progress Notes (Signed)
Subjective:   Patient ID: Sarah Payne, female    DOB: [birth year], 69 y.o.   MRN: [id number]  Sarah Payne is a pleasant 69 y.o. year old female who presents to clinic today with Hospitalization Follow-up  on 10/27/8117  HPI:  Complicated medical history, including CAD s/p stent with current long term tobacco use, PAD, poorly controlled DM, hypothyroidism and depression.  Hospitalized 1/23- 1/13 - [facility]- with chest pain- notes reviewed. Diagnosed with unstable angina- cardiac cath was done which showed severe yet stable CAD.  EF normal. Lab Results  Component Value Date   CHOL 287* 04/26/2014   HDL 34.30* 04/26/2014   LDLCALC 70 04/30/2011   LDLDIRECT 174.5 04/26/2014   TRIG * 04/26/2014    425.0 Triglyceride is over 400; calculations on Lipids are invalid.   CHOLHDL 8 04/26/2014   Was followed by endocrinology for her hypothyroidism and diabetes but she has not been compliant with follow up. Did not want to drive to [place] anymore to see Dr. Cruzita Lederer so I referred her to another endocrinology group in [facility]- Dr. Gabriel Carina. Also going to diabetic educator now. Checking FSBS twice daily- today FSBS 199.  In hospital, LDL 180 and her TSH was very elevated as well, with FT4 of 0.18 a1c 10.9  She is out of her levothyroxine.   Lantus 15 units added to her home diabetes rx.  Saw Dr. Rockey Situ on 1/19-16- note reviewed. Hypotensive so coreg and enalapril doses decreased.  She has been trying to cut back on smoking- down to 3 cigs per day.  Has been very "depressed" since she was discharged.  Step mom died and brother had cancer all within in this past year.  She is tired of feeling sick.  Not sleeping well.  Tearful.  Appetite not good- Wt Readings from Last 3 Encounters:  08/16/14 139 lb 8 oz (63.277 kg)  08/08/14 139 lb (63.05 kg)  08/01/14 138 lb (62.596 kg)   Denies SI or HI.  Current Outpatient Prescriptions on File Prior to Visit  Medication Sig Dispense Refill  .  albuterol (PROVENTIL HFA;VENTOLIN HFA) 108 (90 BASE) MCG/ACT inhaler Inhale 2 puffs into the lungs every 6 (six) hours as needed for wheezing. 1 Inhaler 11  . aspirin 81 MG tablet Take 81 mg by mouth daily.      Marland Kitchen atorvastatin (LIPITOR) 40 MG tablet Take 1 tablet (40 mg total) by mouth daily. 90 tablet 3  . Calcium & Magnesium Carbonates (MYLANTA PO) Take by mouth.      . carvedilol (COREG) 3.125 MG tablet Take 2 tablets (6.25 mg total) by mouth 2 (two) times daily. 180 tablet 3  . clobetasol ointment (TEMOVATE) 0.05 % APPLY EVERY NIGHT TO VAGINAL FOLDS 60 g 1  . clonazePAM (KLONOPIN) 0.5 MG tablet Take 1 tablet (0.5 mg total) by mouth at bedtime as needed for anxiety. 20 tablet 1  . clopidogrel (PLAVIX) 75 MG tablet TAKE 1 TABLET (75 MG TOTAL) BY MOUTH DAILY. 90 tablet 3  . cyanocobalamin (,VITAMIN B-12,) 1000 MCG/ML injection every 30 (thirty) days. INJECT 1 ML (1,000 MCG TOTAL) INTO THE MUSCLE ONCE.    Marland Kitchen diazepam (VALIUM) 2 MG tablet Take 1 tablet (2 mg total) by mouth every 8 (eight) hours as needed for muscle spasms. 20 tablet 0  . DULoxetine (CYMBALTA) 60 MG capsule TAKE 1 CAPSULE BY MOUTH DAILY. 90 capsule 3  . enalapril (VASOTEC) 5 MG tablet Take 1 tablet (5 mg total) by mouth daily. 90 tablet  3  . etodolac (LODINE) 400 MG tablet Take 1 tablet (400 mg total) by mouth 2 (two) times daily. 180 tablet 1  . fluticasone (FLOVENT HFA) 220 MCG/ACT inhaler Inhale 1 puff into the lungs 2 (two) times daily. 2 Inhaler 1  . gabapentin (NEURONTIN) 100 MG capsule Take 1 capsule (100 mg total) by mouth 3 (three) times daily. 270 capsule 1  . glipiZIDE (GLUCOTROL) 10 MG tablet Before breakfast and supper (evening meal) 180 tablet 1  . glucose blood (FREESTYLE LITE) test strip Check blood sugar 3 times daily to check blood sugars - Uncontrolled diabetes mellitus 300 each 3  . glucose monitoring kit (FREESTYLE) monitoring kit 1 each by Does not apply route as needed for other. PHARMACY PLEASE SUBSTITUTE  WHATEVER GLUCOMETER YOU CARRY 1 each 1  . isosorbide mononitrate (IMDUR) 30 MG 24 hr tablet Take 1 tablet (30 mg total) by mouth daily. 90 tablet 3  . magnesium oxide (MAG-OX) 400 MG tablet Take 1 tablet (400 mg total) by mouth daily. 90 tablet 1  . metFORMIN (GLUCOPHAGE) 500 MG tablet Take 2 tablets (1,000 mg total) by mouth 2 (two) times daily with a meal. 180 tablet 3  . Na Sulfate-K Sulfate-Mg Sulf (SUPREP BOWEL PREP) SOLN USE PER PREP INSTRUCTIONS 1 Bottle 0  . nitroGLYCERIN (NITROSTAT) 0.4 MG SL tablet Place 1 tablet (0.4 mg total) under the tongue every 5 (five) minutes as needed. 25 tablet 2  . omeprazole (PRILOSEC) 20 MG capsule Take 1 capsule (20 mg total) by mouth 2 (two) times daily before a meal. 60 capsule 11  . sitaGLIPtin (JANUVIA) 100 MG tablet Take 1 tablet (100 mg total) by mouth daily. 90 tablet 3  . tiotropium (SPIRIVA HANDIHALER) 18 MCG inhalation capsule Place 1 capsule (18 mcg total) into inhaler and inhale daily. 30 capsule 2  . traZODone (DESYREL) 100 MG tablet TAKE 1 TABLET (100 MG TOTAL) BY MOUTH AT BEDTIME. 90 tablet 3  . donepezil (ARICEPT) 5 MG tablet Take 1 tablet (5 mg total) by mouth at bedtime. 90 tablet 1   No current facility-administered medications on file prior to visit.    Allergies  Allergen Reactions  . No Known Allergies     Past Medical History  Diagnosis Date  . Diabetes mellitus   . Hypertension   . Hypothyroidism   . Depression   . Vascular dementia   . Tobacco abuse     a. 45 pack year history - quit 09/16/11  . Chronic low back pain   . Coronary artery disease     a. s/p PCI/DES LCX x 3;  b. NSTEMI 09/16/11;  c.   09/17/11 Cath [facility] Severe RCA/LCX dzs - RCA treated, LCX intervention pending w/ Dr. Clayborn Bigness  . Sleep apnea     mild-does not use cpap  . Impingement syndrome of left shoulder   . Tendonitis of left rotator cuff   . Diverticulosis     Past Surgical History  Procedure Laterality Date  . Back surgery    . Abdominal  hysterectomy    . Appendectomy    . Cardiac catheterization  2013  . Coronary angioplasty  2012    stent x 3   . Coronary angioplasty with stent placement  2013  . Cardiac catheterization  2016    Family History  Problem Relation Age of Onset  . Heart attack Mother     First MI @ 35 - Died @ 6  . Heart disease Mother   .  Heart disease Father     Died @ 2  . Throat cancer Brother     History   Social History  . Marital Status: Married    Spouse Name: N/A    Number of Children: N/A  . Years of Education: N/A   Occupational History  . Not on file.   Social History Main Topics  . Smoking status: Current Every Day Smoker -- 0.25 packs/day for 45 years    Types: Cigarettes  . Smokeless tobacco: Never Used     Comment: Has cut back, trying to quit.   . Alcohol Use: No  . Drug Use: Yes    Special: Marijuana     Comment: smoked a "couple of nights ago"  . Sexual Activity: Not on file   Other Topics Concern  . Not on file   Social History Narrative   Lives at home with her husband in [place].  Previously used marijuana - quit.      Regular exercise: no/ pain from a frozen rotator cuff   Caffeine use: coffee daily and pepsi   The PMH, PSH, Social History, Family History, Medications, and allergies have been reviewed in [facility], and have been updated if relevant.  Review of Systems  Constitutional: Positive for fatigue.  Respiratory: Positive for cough. Negative for shortness of breath.   Cardiovascular: Negative.   Gastrointestinal: Positive for diarrhea. Negative for nausea, blood in stool, anal bleeding and rectal pain.  Musculoskeletal: Negative.   Psychiatric/Behavioral: Positive for sleep disturbance and dysphoric mood. Negative for suicidal ideas and self-injury. The patient is nervous/anxious.   All other systems reviewed and are negative.      Objective:    BP 142/82 mmHg  Pulse 61  Temp(Src) 98 F (36.7 C) (Oral)  Wt 139 lb 8 oz (63.277 kg)  SpO2  98%   Physical Exam  Constitutional: She is oriented to person, place, and time. She appears well-developed and well-nourished. No distress.  HENT:  Head: Normocephalic.  Eyes: Conjunctivae are normal.  Neck: Normal range of motion.  Cardiovascular: Normal rate and regular rhythm.   Pulmonary/Chest: Breath sounds normal. No respiratory distress. She has no wheezes. She has no rales. She exhibits no tenderness.  Abdominal: Soft.  Musculoskeletal: Normal range of motion. She exhibits no edema.  Neurological: She is alert and oriented to person, place, and time. No cranial nerve deficit.  Skin: Skin is warm and dry.  Psychiatric:  [place], good eye contact and pleasant  Nursing note and vitals reviewed.         Assessment & Plan:   CAD, multiple vessel  Other specified hypothyroidism  Diabetes mellitus type 2 with peripheral artery disease No Follow-up on file.

## 2014-08-16 NOTE — Assessment & Plan Note (Signed)
New- Discussed tx options. She is declining psychotherapy but does want to try rx. eRx sent for lexapro 10 mg daily. She will call me in 3 weeks with an update. The patient indicates understanding of these issues and agrees with the plan.

## 2014-08-16 NOTE — Assessment & Plan Note (Signed)
Has gained a few pounds which is reassuring. Likely due to both depression and fear of eating due to bout of c diff. She is trying to slowly increase her intake.

## 2014-08-16 NOTE — Progress Notes (Signed)
Pre visit review using our clinic review tool, if applicable. No additional management support is needed unless otherwise documented below in the visit note. 

## 2014-08-16 NOTE — Assessment & Plan Note (Signed)
Resolved with decreased dosages of coreg and enalapril. BP almost at goal for diabetic today.

## 2014-08-16 NOTE — Patient Instructions (Addendum)
Good to see you. Please call Dr. Joycie Peek office today and take your thyroid and diabetes medications as prescribed. We will call you with your lab results. We are starting Lexapro 10 mg daily- please call me in a few weeks with an update.   How to Kick the Smoking Habit   Why should I quit smoking?  Quitting smoking is the most important thing you can do for your health. Smoking can cause cancer, lung disease, heart disease, and many other health problems. Secondhand smoke can be dangerous too. It can cause lung cancer and heart disease in adults. It can make asthma worse or cause ear infections in kids.   You'll see benefits as soon as you quit smoking. Your heart rate and blood pressure will go down. You'll breathe easier. It will be easier to exercise. Your sense of smell and taste will be better. You'll lower your risk of cancer, lung disease, and heart disease. You'll even live longer!   Why is it so hard to quit smoking?  Nicotine is a strong drug. Your body becomes addicted to nicotine when you smoke. You may have withdrawal symptoms or cravings when you stop smoking. You may become anxious or irritable. You might have trouble sleeping or want to eat more. These symptoms are usually worst the first week after quitting. The good news is nicotine withdrawal symptoms only last a few weeks for most people.  The routines and habits that go along with smoking can make it tough to quit too. Some people often smoke a cigarette when they drive, after a meal, or when they're on the phone. Smoking can become a part of these routines. After you quit smoking these habits can be a trigger to make you want to smoke again. It's important to separate smoking from these routines when you quit.  How can I make it easier to quit?  You don't have to quit "cold [place]." You can double or triple the chance that you'll stop smoking if you use a medicine and counseling together. There are many medicines  available. These medicines work in different ways to help manage nicotine withdrawal. Many can be bought off the shelves at your local pharmacy. Some require a prescription. Talk to your pharmacist or prescriber about what medicines may be right for you.  It is very important to have counseling when you quit. Medicines can help you cope with nicotine withdrawal. Counseling can help you develop skills to break smoking habits. There are lots of counseling options available. Many of these are free. Some options are local support groups, telephone quitlines, online services, and texting programs.   Start thinking now about how you plan to quit. Think about why you want to quit. Look at triggers that make you want to smoke. Plan for challenges you might face when trying to quit. Talk to your pharmacist about how to get help.   Where can I learn more?  Toll-free Quitlines and Websites:  In the [place].: 1-800-QUIT-NOW(1-571-548-4313); http://smokefree.gov    These websites include online support, live chat, and text messaging programs.

## 2014-08-16 NOTE — Assessment & Plan Note (Signed)
Recent stable cardiac cath and recent follow up with cardiology. No changes made today. Strongly advised smoking cessation again today.

## 2014-08-18 ENCOUNTER — Telehealth: Payer: Self-pay

## 2014-08-18 NOTE — Telephone Encounter (Signed)
Pt request status of thyroid med refill to [facility] [address].advised pt Dr Deborra Medina sent electronically to [address] on 08/16/14. Pt will ck with pharmacy.

## 2014-08-21 ENCOUNTER — Ambulatory Visit: Payer: Self-pay | Admitting: Internal Medicine

## 2014-08-30 ENCOUNTER — Encounter: Payer: Self-pay | Admitting: Family Medicine

## 2014-08-30 ENCOUNTER — Telehealth: Payer: Self-pay | Admitting: Family Medicine

## 2014-08-30 ENCOUNTER — Ambulatory Visit (INDEPENDENT_AMBULATORY_CARE_PROVIDER_SITE_OTHER): Payer: Medicare Other | Admitting: Family Medicine

## 2014-08-30 VITALS — BP 170/100 | HR 64 | Temp 97.9°F | Wt 138.8 lb

## 2014-08-30 DIAGNOSIS — E038 Other specified hypothyroidism: Secondary | ICD-10-CM | POA: Diagnosis not present

## 2014-08-30 DIAGNOSIS — F4323 Adjustment disorder with mixed anxiety and depressed mood: Secondary | ICD-10-CM | POA: Diagnosis not present

## 2014-08-30 DIAGNOSIS — I2510 Atherosclerotic heart disease of native coronary artery without angina pectoris: Secondary | ICD-10-CM

## 2014-08-30 DIAGNOSIS — Z720 Tobacco use: Secondary | ICD-10-CM

## 2014-08-30 DIAGNOSIS — R110 Nausea: Secondary | ICD-10-CM

## 2014-08-30 DIAGNOSIS — R197 Diarrhea, unspecified: Secondary | ICD-10-CM | POA: Diagnosis not present

## 2014-08-30 LAB — COMPREHENSIVE METABOLIC PANEL
ALT: 10 U/L (ref 0–35)
AST: 12 U/L (ref 0–37)
Albumin: 4.3 g/dL (ref 3.5–5.2)
Alkaline Phosphatase: 60 U/L (ref 39–117)
BUN: 15 mg/dL (ref 6–23)
CO2: 31 mEq/L (ref 19–32)
Calcium: 9.5 mg/dL (ref 8.4–10.5)
Chloride: 102 mEq/L (ref 96–112)
Creatinine, Ser: 1.02 mg/dL (ref 0.40–1.20)
GFR: 57.54 mL/min — ABNORMAL LOW (ref >60.00)
Glucose, Bld: 156 mg/dL — ABNORMAL HIGH (ref 70–99)
Potassium: 3.8 mEq/L (ref 3.5–5.1)
Sodium: 139 mEq/L (ref 135–145)
Total Bilirubin: 0.4 mg/dL (ref 0.2–1.2)
Total Protein: 6.9 g/dL (ref 6.0–8.3)

## 2014-08-30 LAB — CBC WITH DIFFERENTIAL/PLATELET
Basophils Absolute: 0 10*3/uL (ref 0.0–0.1)
Basophils Relative: 0.5 % (ref 0.0–3.0)
Eosinophils Absolute: 0 10*3/uL (ref 0.0–0.7)
Eosinophils Relative: 0.7 % (ref 0.0–5.0)
HCT: 34.1 % — ABNORMAL LOW (ref 36.0–46.0)
Hemoglobin: 11.4 g/dL — ABNORMAL LOW (ref 12.0–15.0)
Lymphocytes Relative: 30.4 % (ref 12.0–46.0)
Lymphs Abs: 1.9 10*3/uL (ref 0.7–4.0)
MCHC: 33.6 g/dL (ref 30.0–36.0)
MCV: 94.8 fl (ref 78.0–100.0)
Monocytes Absolute: 0.4 10*3/uL (ref 0.1–1.0)
Monocytes Relative: 7 % (ref 3.0–12.0)
Neutro Abs: 3.9 10*3/uL (ref 1.4–7.7)
Neutrophils Relative %: 61.4 % (ref 43.0–77.0)
Platelets: 315 10*3/uL (ref 150.0–400.0)
RBC: 3.6 Mil/uL — ABNORMAL LOW (ref 3.87–5.11)
RDW: 16.1 % — ABNORMAL HIGH (ref 11.5–15.5)
WBC: 6.4 10*3/uL (ref 4.0–10.5)

## 2014-08-30 MED ORDER — ONDANSETRON 4 MG PO TBDP
4.0000 mg | ORAL_TABLET | Freq: Once | ORAL | Status: AC
  Administered 2014-08-30: 4 mg via ORAL

## 2014-08-30 MED ORDER — METRONIDAZOLE 500 MG PO TABS: 500.0000 mg | ORAL_TABLET | Freq: Three times a day (TID) | ORAL | Status: DC

## 2014-08-30 MED ORDER — ONDANSETRON HCL 4 MG PO TABS: 4.0000 mg | ORAL_TABLET | Freq: Three times a day (TID) | ORAL | Status: DC | PRN

## 2014-08-30 NOTE — Patient Instructions (Addendum)
I am also concerned for repeat C diff infection. zofran for nausea. Blood work today. We have sent you home with c diff test as well. Collect sample then start flagyl antibiotic. If worsening dehydration, you may need to go to ER for IV fluids.  Clostridium Difficile Infection Clostridium difficile (C. difficile) is a bacteria found in the intestinal tract or colon. Under certain conditions, it causes diarrhea and sometimes severe disease. The severe form of the disease is known as pseudomembranous colitis (often called C. difficile colitis). This disease can damage the lining of the colon or cause the colon to become enlarged (toxic megacolon). CAUSES Your colon normally contains many different bacteria, including C. difficile. The balance of bacteria in your colon can change during illness. This is especially true when you take antibiotic medicine. Taking antibiotics may allow the C. difficile to grow, multiply excessively, and make a toxin that then causes illness. The elderly and people with certain medical conditions have a greater risk of getting C. difficile infections. SYMPTOMS  Watery diarrhea.  Fever.  Fatigue.  Loss of appetite.  Nausea.  Abdominal swelling, pain, or tenderness.  Dehydration. DIAGNOSIS Your symptoms may make your caregiver suspect a C. difficile infection, especially if you have used antibiotics in the preceding weeks. However, there are only 2 ways to know for certain whether you have a C. difficile infection:  A lab test that finds the toxin in your stool.  The specific appearance of an abnormality (pseudomembrane) in your colon. This can only be seen by doing a sigmoidoscopy or colonoscopy. These procedures involve passing an instrument through your rectum to look at the inside of your colon. Your caregiver will help determine if these tests are necessary. TREATMENT  Most people are successfully treated with one of two specific antibiotics, usually  given by mouth. Other antibiotics you are receiving are stopped if possible.  Intravenous (IV) fluids and correction of electrolyte imbalance may be necessary.  Rarely, surgery may be needed to remove the infected part of the intestines.  Careful hand washing by you and your caregivers is important to prevent the spread of infection. In the hospital, your caregivers may also put on gowns and gloves to prevent the spread of the C. difficile bacteria. Your room is also cleaned regularly with a solution containing bleach or a product that is known to kill C. difficile. HOME CARE INSTRUCTIONS  Drink enough fluids to keep your urine clear or pale yellow. Avoid milk, caffeine, and alcohol.  Ask your caregiver for specific rehydration instructions.  Try eating small, frequent meals rather than large meals.  Take your antibiotics as directed. Finish them even if you start to feel better.  Do not use medicines to slow diarrhea. This could delay healing or cause complications.  Wash your hands thoroughly after using the bathroom and before preparing food.  Make sure people who live with you wash their hands often, too.  Carefully disinfect all surfaces with a product that contains chlorine bleach. SEEK MEDICAL CARE IF:  Diarrhea persists longer than expected or recurs after completing your course of antibiotic treatment for the C. difficile infection.  You have trouble staying hydrated. SEEK IMMEDIATE MEDICAL CARE IF:  You develop a new fever.  You have increasing abdominal pain or tenderness.  There is blood in your stools, or your stools are dark black and tarry.  You cannot hold down food or liquids. MAKE SURE YOU:  Understand these instructions.  Will watch your condition.  Will get  help right away if you are not doing well or get worse. Document Released: 04/16/2005 Document Revised: 11/21/2013 Document Reviewed: 12/13/2010 [facility] Patient Information 2015 [place], [place].  This information is not intended to replace advice given to you by your health care provider. Make sure you discuss any questions you have with your health care provider.

## 2014-08-30 NOTE — Assessment & Plan Note (Signed)
Remains depressed with recent illnesses - continue cymbalta and recently started lexapro.

## 2014-08-30 NOTE — Telephone Encounter (Signed)
Pt already has appt with Dr Darnell Level on 08/30/14 at 12:30 PM.

## 2014-08-30 NOTE — Assessment & Plan Note (Signed)
Blood pressure uncontrolled today, anticipate to stress of current diarrheal illness. Will need close monitoring when feeling better.

## 2014-08-30 NOTE — Progress Notes (Signed)
Pre visit review using our clinic review tool, if applicable. No additional management support is needed unless otherwise documented below in the visit note. 

## 2014-08-30 NOTE — Assessment & Plan Note (Signed)
Continue to encourage cessation, down to 5 cig/day

## 2014-08-30 NOTE — Telephone Encounter (Signed)
PLEASE NOTE: All timestamps contained within this report are represented as [place] Standard Time. CONFIDENTIALTY NOTICE: This fax transmission is intended only for the addressee. It contains information that is legally privileged, confidential or otherwise protected from use or disclosure. If you are not the intended recipient, you are strictly prohibited from reviewing, disclosing, copying using or disseminating any of this information or taking any action in reliance on or regarding this information. If you have received this fax in error, please notify us immediately by telephone so that we can arrange for its return to [place]. Phone: [phone], Toll-Free: [phone], Fax: [phone] Page: 1 of 1 Call Id: [id number] [place] Patient Name: Sarah Payne DOB: 1946/06/23 Initial Comment Caller states she thinks she has c--diff and light headed when she stands, Nurse Assessment Nurse: Ronnald Ramp, RN, Miranda Date/Time (Eastern Time): 08/30/2014 10:18:54 AM Confirm and document reason for call. If symptomatic, describe symptoms. ---Caller states she is having nausea, feeling lightheaded, and diarrhea for the last 2-3 days. This morning she is having diarrhea (lose of control of stool during the night). Concerned she could be having symptoms of C-Diff again. She had been treated up until a couple of weeks ago and had a negative stool. Has the patient traveled out of the country within the last 30 days? ---No Does the patient require triage? ---Yes Related visit to physician within the last 2 weeks? ---No Does the PT have any chronic conditions? (i.e. diabetes, asthma, etc.) ---Yes List chronic conditions. ---Heart, High Cholesterol, Diabetes, HTN Guidelines Guideline Title Affirmed Question Affirmed Notes Diarrhea [1] Age > 60 years AND [2] > 6 diarrhea stools in past 24 hours Final Disposition  User See Physician within 4 Hours (or PCP triage) Ronnald Ramp, RN, Miranda Comments Appt scheduled for 1230 with Dr. Danise Mina

## 2014-08-30 NOTE — Assessment & Plan Note (Addendum)
Recurrent diarrheal episodes over last 3 days along with nausea, and stool incontinence, but no fevers, blood in stool, or abdominal pain.  In setting of recent STEC Ecoli infection then C diff infection 05/2014. Concern for recurrent C diff infection Does not seem dehydrated today. Will check CBC, CMP, stool cultures and c diff EIA today and then start empiric flagyl 500mg  tid x 2 weeks while we await culture results. Discussed importance of maintaining hydration status as well as red flags to seek urgent care. Pt agrees with plan

## 2014-08-30 NOTE — Assessment & Plan Note (Signed)
Just restarted levothyroxine. Will call in 3 wks to schedule f/u lab appt.

## 2014-08-30 NOTE — Progress Notes (Addendum)
BP 170/100 mmHg  Pulse 64  Temp(Src) 97.9 F (36.6 C) (Oral)  Wt 138 lb 12 oz (62.937 kg)   CC: diarrhea  Subjective:    Patient ID: Sarah Payne, female    DOB: 1945/09/30, 69 y.o.   MRN: [id number]  HPI: Sarah Payne is a 69 y.o. female presenting on 08/30/2014 for Diarrhea and Dizziness   Complicated patient of Dr Hulen Shouts with diabetes, hypertension, hypothyroidism, depression, vascular dementia, tobacco abuse (down to 5 cig/day), CAD, and mild sleep apnea presents with 3d h/o nausea, watery diarrhea (3-4 x/day) associated with stool incontinence. Feels exhausted.   No fevers/chills, nausea, abdominal pain. No blood or mucous in stool. No new foods or restaurants. No recent travel. No sick contacts at home.  Recent E coli infection that led to C diff infection treated with flagyl 500mg  tid for 2 weeks. This resolved sxs.   Was off thyroid med for 1 month, now back on this. Restarted levothyroxine 1 week ago.   Recently started on antidepressant lexapro 10mg  QHS, also on cymbalta 60mg  daily.   Strong CAD hx s/p 5 stents.   Relevant past medical, surgical, family and social history reviewed and updated as indicated. Interim medical history since our last visit reviewed. Allergies and medications reviewed and updated. Current Outpatient Prescriptions on File Prior to Visit  Medication Sig  . albuterol (PROVENTIL HFA;VENTOLIN HFA) 108 (90 BASE) MCG/ACT inhaler Inhale 2 puffs into the lungs every 6 (six) hours as needed for wheezing.  Marland Kitchen aspirin 81 MG tablet Take 81 mg by mouth daily.    Marland Kitchen atorvastatin (LIPITOR) 40 MG tablet Take 1 tablet (40 mg total) by mouth daily.  . Calcium & Magnesium Carbonates (MYLANTA PO) Take by mouth.    . carvedilol (COREG) 3.125 MG tablet Take 2 tablets (6.25 mg total) by mouth 2 (two) times daily.  . clobetasol ointment (TEMOVATE) 0.05 % APPLY EVERY NIGHT TO VAGINAL FOLDS  . clonazePAM (KLONOPIN) 0.5 MG tablet Take 1 tablet (0.5 mg total) by mouth at  bedtime as needed for anxiety.  . clopidogrel (PLAVIX) 75 MG tablet TAKE 1 TABLET (75 MG TOTAL) BY MOUTH DAILY.  . cyanocobalamin (,VITAMIN B-12,) 1000 MCG/ML injection Inject 1 mL (1,000 mcg total) into the muscle every 30 (thirty) days. INJECT 1 ML (1,000 MCG TOTAL) INTO THE MUSCLE ONCE.  Marland Kitchen diazepam (VALIUM) 2 MG tablet Take 1 tablet (2 mg total) by mouth every 8 (eight) hours as needed for muscle spasms.  . DULoxetine (CYMBALTA) 60 MG capsule TAKE 1 CAPSULE BY MOUTH DAILY.  Marland Kitchen enalapril (VASOTEC) 5 MG tablet Take 1 tablet (5 mg total) by mouth daily.  Marland Kitchen escitalopram (LEXAPRO) 10 MG tablet Take 1 tablet (10 mg total) by mouth at bedtime.  . fluticasone (FLOVENT HFA) 220 MCG/ACT inhaler Inhale 1 puff into the lungs 2 (two) times daily.  Marland Kitchen gabapentin (NEURONTIN) 100 MG capsule Take 1 capsule (100 mg total) by mouth 3 (three) times daily.  Marland Kitchen glipiZIDE (GLUCOTROL) 10 MG tablet Before breakfast and supper (evening meal)  . glucose blood (FREESTYLE LITE) test strip Check blood sugar 3 times daily to check blood sugars - Uncontrolled diabetes mellitus  . glucose monitoring kit (FREESTYLE) monitoring kit 1 each by Does not apply route as needed for other. PHARMACY PLEASE SUBSTITUTE WHATEVER GLUCOMETER YOU CARRY  . insulin glargine (LANTUS) 100 UNIT/ML injection 15 units qhs  . isosorbide mononitrate (IMDUR) 30 MG 24 hr tablet Take 1 tablet (30 mg total) by mouth daily.  Marland Kitchen  Levothyroxine Sodium 150 MCG CAPS Take 1 capsule (150 mcg total) by mouth daily before breakfast.  . magnesium oxide (MAG-OX) 400 MG tablet Take 1 tablet (400 mg total) by mouth daily.  . metFORMIN (GLUCOPHAGE) 500 MG tablet Take 2 tablets (1,000 mg total) by mouth 2 (two) times daily with a meal.  . Na Sulfate-K Sulfate-Mg Sulf (SUPREP BOWEL PREP) SOLN USE PER PREP INSTRUCTIONS  . nitroGLYCERIN (NITROSTAT) 0.4 MG SL tablet Place 1 tablet (0.4 mg total) under the tongue every 5 (five) minutes as needed.  Marland Kitchen omeprazole (PRILOSEC) 20 MG  capsule Take 1 capsule (20 mg total) by mouth 2 (two) times daily before a meal.  . sitaGLIPtin (JANUVIA) 100 MG tablet Take 1 tablet (100 mg total) by mouth daily.  Marland Kitchen tiotropium (SPIRIVA HANDIHALER) 18 MCG inhalation capsule Place 1 capsule (18 mcg total) into inhaler and inhale daily.  . traZODone (DESYREL) 100 MG tablet TAKE 1 TABLET (100 MG TOTAL) BY MOUTH AT BEDTIME.  Marland Kitchen donepezil (ARICEPT) 5 MG tablet Take 1 tablet (5 mg total) by mouth at bedtime.  Marland Kitchen etodolac (LODINE) 400 MG tablet Take 1 tablet (400 mg total) by mouth 2 (two) times daily. (Patient not taking: Reported on 08/30/2014)   No current facility-administered medications on file prior to visit.    Review of Systems Per HPI unless specifically indicated above     Objective:    BP 170/100 mmHg  Pulse 64  Temp(Src) 97.9 F (36.6 C) (Oral)  Wt 138 lb 12 oz (62.937 kg)  Wt Readings from Last 3 Encounters:  08/30/14 138 lb 12 oz (62.937 kg)  08/16/14 139 lb 8 oz (63.277 kg)  08/08/14 139 lb (63.05 kg)    Physical Exam  Constitutional: She appears well-developed and well-nourished. No distress.  Thin, tired but nontoxic  HENT:  Mouth/Throat: Oropharynx is clear and moist. No oropharyngeal exudate.  Cardiovascular: Normal rate, regular rhythm, normal heart sounds and intact distal pulses.   No murmur heard. Pulmonary/Chest: Effort normal and breath sounds normal. No respiratory distress. She has no wheezes. She has no rales.  Abdominal: Soft. Normal appearance and bowel sounds are normal. She exhibits no distension and no mass. There is no hepatosplenomegaly. There is no tenderness (but there is diffuse discomfort). There is no rebound, no guarding and no CVA tenderness.  Musculoskeletal: She exhibits no edema.  Skin: Skin is warm and dry.  Nursing note and vitals reviewed.  Results for orders placed or performed in visit on 08/16/14  TSH  Result Value Ref Range   TSH 60.34 (H) 0.35 - 4.50 uIU/mL  T4, Free  Result  Value Ref Range   Free T4 0.30 (L) 0.60 - 1.60 ng/dL  orthostatic vital signs negative today.    Assessment & Plan:   Problem List Items Addressed This Visit    Tobacco abuse    Continue to encourage cessation, down to 5 cig/day      Hypothyroidism    Just restarted levothyroxine. Will call in 3 wks to schedule f/u lab appt.      Diarrhea - Primary    Recurrent diarrheal episodes over last 3 days along with nausea, and stool incontinence, but no fevers, blood in stool, or abdominal pain.  In setting of recent STEC Ecoli infection then C diff infection 05/2014. Concern for recurrent C diff infection Does not seem dehydrated today. Will check CBC, CMP, stool cultures and c diff EIA today and then start empiric flagyl 528m tid x 2 weeks while  we await culture results. Discussed importance of maintaining hydration status as well as red flags to seek urgent care. Pt agrees with plan       Relevant Orders   C. difficile GDH and Toxin A/B   Stool culture   CAD, multiple vessel    Blood pressure uncontrolled today, anticipate to stress of current diarrheal illness. Will need close monitoring when feeling better.      Adjustment disorder with mixed anxiety and depressed mood    Remains depressed with recent illnesses - continue cymbalta and recently started lexapro.       Other Visit Diagnoses    Nausea        Relevant Medications    ondansetron (ZOFRAN-ODT) disintegrating tablet 4 mg (Completed)    Other Relevant Orders    Comprehensive metabolic panel    CBC with Differential/Platelet        Follow up plan: Return if symptoms worsen or fail to improve.

## 2014-09-05 NOTE — Addendum Note (Signed)
Addended by: Ellamae Sia on: 09/05/2014 10:49 AM   Modules accepted: Orders

## 2014-09-06 DIAGNOSIS — R197 Diarrhea, unspecified: Secondary | ICD-10-CM | POA: Diagnosis not present

## 2014-09-06 NOTE — Addendum Note (Signed)
Addended by: Ellamae Sia on: 09/06/2014 10:15 AM   Modules accepted: Orders

## 2014-09-07 LAB — C. DIFFICILE GDH AND TOXIN A/B
C. difficile GDH: NOT DETECTED
C. difficile Toxin A/B: NOT DETECTED
Source: 0

## 2014-09-10 LAB — STOOL CULTURE

## 2014-09-18 ENCOUNTER — Telehealth: Payer: Self-pay | Admitting: Family Medicine

## 2014-09-18 MED ORDER — PROMETHAZINE HCL 12.5 MG PO TABS: 12.5000 mg | ORAL_TABLET | Freq: Three times a day (TID) | ORAL | Status: DC | PRN

## 2014-09-18 NOTE — Telephone Encounter (Signed)
Phenergan is stronger than zofran but it causes more sedation.  I can send this in but if her symptoms are lasting this long, she really needs to see GI again.  Please advise her to call GI to reschedule an appointment and I will send in rx for phenergan.

## 2014-09-18 NOTE — Telephone Encounter (Signed)
[facility] Patient Name: Sarah Payne DOB: [birth year] Initial Comment Caller states taking abx for c-diff, very nauseated can't eat Nurse Assessment Nurse: Donalynn Furlong, RN, Myna Hidalgo Date/Time Eilene Ghazi Time): 09/18/2014 12:14:43 PM Confirm and document reason for call. If symptomatic, describe symptoms. ---Caller states she is taking her nausea med, but can only eat "[employer]. I want to have an appetite to eat other things too, but the medicine doesn't last long". States "poop sample" was negative for C-Diff. Has been taking antibiotics and Zofran, however would like to try something different for nausea. Has the patient traveled out of the country within the last 30 days? ---Not Applicable Does the patient require triage? ---No Guidelines Guideline Title Affirmed Question Affirmed Notes Medication Question Call Caller has NON-URGENT medication question about med that PCP prescribed and triager unable to answer question Final Disposition User Call PCP within 24 Hours Donalynn Furlong, RN, Myna Hidalgo Comments "Wants to try a different nausea medicine"

## 2014-09-18 NOTE — Telephone Encounter (Signed)
Spoke to pt and advised per Dr Aron; pt verbally expressed understanding.  

## 2014-09-25 ENCOUNTER — Telehealth: Payer: Self-pay | Admitting: Gastroenterology

## 2014-09-25 NOTE — Telephone Encounter (Signed)
A user error has taken place.

## 2014-09-25 NOTE — Telephone Encounter (Signed)
Spoke with patient and she is having diarrhea and nausea. She states she had a stool study that was negative for c. Diff but she is still sick. Scheduled with Dr. Deatra Ina on Wednesday at 9:45 AM.

## 2014-09-27 ENCOUNTER — Ambulatory Visit (INDEPENDENT_AMBULATORY_CARE_PROVIDER_SITE_OTHER): Payer: Medicare Other | Admitting: Gastroenterology

## 2014-09-27 ENCOUNTER — Other Ambulatory Visit: Payer: Medicare Other

## 2014-09-27 ENCOUNTER — Encounter: Payer: Self-pay | Admitting: Gastroenterology

## 2014-09-27 VITALS — BP 150/92 | HR 84 | Ht 66.0 in | Wt 139.5 lb

## 2014-09-27 DIAGNOSIS — R197 Diarrhea, unspecified: Secondary | ICD-10-CM

## 2014-09-27 DIAGNOSIS — I2510 Atherosclerotic heart disease of native coronary artery without angina pectoris: Secondary | ICD-10-CM

## 2014-09-27 MED ORDER — VANCOMYCIN HCL 125 MG PO CAPS: 125.0000 mg | ORAL_CAPSULE | Freq: Four times a day (QID) | ORAL | Status: DC

## 2014-09-27 NOTE — Progress Notes (Signed)
      History of Present Illness:  Sarah Payne has returned for evaluation of diarrhea.  She was seen in November, 2015 what turned out to be an Escherichia coli infection.  She claims she was treated with antibiotics but I do not see that indicated on her record.  In late November she tested positive for C. difficile and was treated with Flagyl.  She did well until approximately 3 weeks ago.  Diarrhea has recurred and is daily event that occurs multiple times accompanied by urgency and incontinence.  C. difficile from several weeks ago was negative.  She was empirically treated with Flagyl without improvement.  She complains of nausea and mild abdominal discomfort.    Review of Systems: Pertinent positive and negative review of systems were noted in the above HPI section. All other review of systems were otherwise negative.    Current Medications, Allergies, Past Medical History, Past Surgical History, Family History and Social History were reviewed in [place] record  Vital signs were reviewed in today's medical record. Physical Exam: General: Slightly ill-appearing female in no acute distress On abdominal exam is mild.  Umbilical tenderness without guarding or rebound  See Assessment and Plan under Problem List

## 2014-09-27 NOTE — Patient Instructions (Addendum)
Go to the basement for labs today Discontinue Flagyl A new prescription will be sent to your pharmacy (Vancomycin)  Purchase Flora Q over the counter

## 2014-09-27 NOTE — Assessment & Plan Note (Signed)
She initially had pseudomembranous colitis that improved with Flagyl.  Now with 3 week history of severe diarrhea, C. difficile toxin negative, without improvement with Flagyl.  Recurrent Pseudomonas colitis and enteric infections should be ruled out.  Postinfectious IBS is also a consideration.  Recommendations #1 repeat stool pathogen panel #2 empiric therapy with vancomycin 125 mg 4 times a day; patient was instructed to call back in 5-6 days to report progress #3 to consider colonoscopy pending results of above (note patient is on Plavix)

## 2014-10-04 ENCOUNTER — Telehealth: Payer: Self-pay | Admitting: Gastroenterology

## 2014-10-05 LAB — GI PATHOGEN PANEL BY PCR, STOOL
C difficile Toxin A/B: NOT DETECTED
Campylobacter: NOT DETECTED
Cryptosporidium: NOT DETECTED
E. coli O157: NOT DETECTED
Enterotoxigenic E coli (ETEC): NOT DETECTED
Giardia lamblia: NOT DETECTED
Norovirus GI/GII: NOT DETECTED
Rotavirus A: NOT DETECTED
Salmonella: NOT DETECTED
Shiga Toxin-producing E. coli: NOT DETECTED
Shigella: NOT DETECTED

## 2014-10-05 NOTE — Telephone Encounter (Signed)
Spoke with the patient. She is actually feeling better today. She states she has "turned the corner". Will complete antibiotics. Understands to call if she acutely worsens or fails to continue improving.

## 2014-10-17 ENCOUNTER — Telehealth: Payer: Self-pay | Admitting: Gastroenterology

## 2014-10-17 NOTE — Telephone Encounter (Signed)
Dr. Deatra Ina would like the patient to have a colonoscopy asap. May the pt hold her plavix for the colonoscopy? Dr. Rockey Situ please advise.

## 2014-10-17 NOTE — Telephone Encounter (Signed)
Pt states she has been on 3 different antibiotics for cdiff. States she has 3 pills of vancomycin left and she is still having 3-4 diarrhea stools/day. States there is a foul odor to the stool, her stomach feels "sick" and she is having some nausea. Pts gi pathogen panel from 09/27/14 was negative. Please advise.

## 2014-10-17 NOTE — Telephone Encounter (Signed)
DC vancomycin. Begin Florastor one daily Patient needs colonoscopy ASAP.  Check to see whether we can hold Plavix

## 2014-10-17 NOTE — Telephone Encounter (Signed)
It should be okay to hold the Plavix at least 5 days Would continue aspirin 81 mg daily We will call patient and tell him to expect call on the timing of the colonoscopy

## 2014-10-17 NOTE — Telephone Encounter (Signed)
Dr. Deatra Ina pt may hold her plavix for 5 days prior to colon. 1st available slot is May 3rd in the [facility]. Does she need to be done prior to this date? Please advise.

## 2014-10-18 ENCOUNTER — Other Ambulatory Visit: Payer: Self-pay

## 2014-10-18 DIAGNOSIS — R197 Diarrhea, unspecified: Secondary | ICD-10-CM

## 2014-10-18 NOTE — Telephone Encounter (Signed)
Yes.  I have opened up a hospital morning in the middle of April for procedures.

## 2014-10-18 NOTE — Telephone Encounter (Signed)
Pt scheduled for previsit 10/25/14@3 :30pm, colon scheduled at [facility] 11/02/14@8 :30am, pt to arrive there at 7am. Pt aware of appt.

## 2014-10-25 ENCOUNTER — Ambulatory Visit (AMBULATORY_SURGERY_CENTER): Payer: Self-pay

## 2014-10-25 VITALS — Ht 66.5 in | Wt 140.0 lb

## 2014-10-25 DIAGNOSIS — R197 Diarrhea, unspecified: Secondary | ICD-10-CM

## 2014-10-25 MED ORDER — SUPREP BOWEL PREP KIT 17.5-3.13-1.6 GM/177ML PO SOLN: 1.0000 | Freq: Once | ORAL | Status: DC

## 2014-10-25 NOTE — Progress Notes (Signed)
No allergies to eggs or soy No diet/weight loss meds No home oxygen No past problems with anesthesia   

## 2014-11-02 ENCOUNTER — Encounter (HOSPITAL_COMMUNITY)
Admission: RE | Disposition: A | Discharge status: Home / Self Care | Payer: Self-pay | Source: Ambulatory Visit | Attending: Gastroenterology

## 2014-11-02 ENCOUNTER — Ambulatory Visit (HOSPITAL_COMMUNITY)
Admission: RE | Admit: 2014-11-02 | Discharge: 2014-11-02 | Disposition: A | Discharge status: Home / Self Care | Payer: Medicare Other | Source: Ambulatory Visit | Attending: Gastroenterology | Admitting: Gastroenterology

## 2014-11-02 ENCOUNTER — Encounter (HOSPITAL_COMMUNITY): Payer: Self-pay | Admitting: *Deleted

## 2014-11-02 DIAGNOSIS — D123 Benign neoplasm of transverse colon: Secondary | ICD-10-CM | POA: Diagnosis not present

## 2014-11-02 DIAGNOSIS — D122 Benign neoplasm of ascending colon: Secondary | ICD-10-CM | POA: Diagnosis not present

## 2014-11-02 DIAGNOSIS — K5730 Diverticulosis of large intestine without perforation or abscess without bleeding: Secondary | ICD-10-CM | POA: Insufficient documentation

## 2014-11-02 DIAGNOSIS — K648 Other hemorrhoids: Secondary | ICD-10-CM | POA: Diagnosis not present

## 2014-11-02 DIAGNOSIS — D126 Benign neoplasm of colon, unspecified: Secondary | ICD-10-CM

## 2014-11-02 DIAGNOSIS — Z800 Family history of malignant neoplasm of digestive organs: Secondary | ICD-10-CM

## 2014-11-02 DIAGNOSIS — D120 Benign neoplasm of cecum: Secondary | ICD-10-CM

## 2014-11-02 DIAGNOSIS — D124 Benign neoplasm of descending colon: Secondary | ICD-10-CM | POA: Diagnosis not present

## 2014-11-02 DIAGNOSIS — R197 Diarrhea, unspecified: Secondary | ICD-10-CM

## 2014-11-02 HISTORY — PX: COLONOSCOPY: SHX5424

## 2014-11-02 SURGERY — COLONOSCOPY
Anesthesia: Moderate Sedation

## 2014-11-02 MED ORDER — MIDAZOLAM HCL 5 MG/5ML IJ SOLN
INTRAMUSCULAR | Status: DC | PRN
  Administered 2014-11-02: 2 mg via INTRAVENOUS
  Administered 2014-11-02 (×3): 1 mg via INTRAVENOUS

## 2014-11-02 MED ORDER — FENTANYL CITRATE 0.05 MG/ML IJ SOLN
INTRAMUSCULAR | Status: AC
  Filled 2014-11-02: qty 4

## 2014-11-02 MED ORDER — DIPHENHYDRAMINE HCL 50 MG/ML IJ SOLN
INTRAMUSCULAR | Status: AC
  Filled 2014-11-02: qty 1

## 2014-11-02 MED ORDER — MIDAZOLAM HCL 5 MG/ML IJ SOLN
INTRAMUSCULAR | Status: AC
  Filled 2014-11-02: qty 3

## 2014-11-02 MED ORDER — FENTANYL CITRATE 0.05 MG/ML IJ SOLN
INTRAMUSCULAR | Status: DC | PRN
  Administered 2014-11-02: 25 ug via INTRAVENOUS
  Administered 2014-11-02 (×2): 12.5 ug via INTRAVENOUS
  Administered 2014-11-02: 25 ug via INTRAVENOUS

## 2014-11-02 MED ORDER — DIPHENHYDRAMINE HCL 50 MG/ML IJ SOLN
INTRAMUSCULAR | Status: DC | PRN
  Administered 2014-11-02 (×2): 12.5 mg via INTRAVENOUS

## 2014-11-02 MED ORDER — SODIUM CHLORIDE 0.9 % IV SOLN
INTRAVENOUS | Status: DC
  Administered 2014-11-02: 08:00:00 via INTRAVENOUS

## 2014-11-02 NOTE — Op Note (Signed)
[facility] [place], [id number]   COLONOSCOPY PROCEDURE REPORT     EXAM DATE: 11/02/2014  PATIENT NAME:      Sarah Payne, Sarah Payne           MR #:      [id number]  BIRTHDATE:       [birth year]      VISIT #:     [id number]  ATTENDING:     Inda Castle, MD     STATUS:     outpatient ASSISTANT:      Elspeth Cho and Tory Emerald  INDICATIONS:  The patient is a 69 yr old female here for a colonoscopy due to unexplained diarrhea and patient's immediate family history of colon cancer. PROCEDURE PERFORMED:     Colonoscopy with snare polypectomy Colonoscopy with biopsy MEDICATIONS:     Versed 5 mg IV, Fentanyl 75 mcg IV, and Benadryl 25 mg IV ESTIMATED BLOOD LOSS:     None  CONSENT: The patient understands the risks and benefits of the procedure and understands that these risks include, but are not limited to: sedation, allergic reaction, infection, perforation and/or bleeding. Alternative means of evaluation and treatment include, among others: physical exam, x-rays, and/or surgical intervention. The patient elects to proceed with this endoscopic procedure.  DESCRIPTION OF PROCEDURE: During intra-op preparation period all mechanical & medical equipment was checked for proper function. Hand hygiene and appropriate measures for infection prevention was taken. After the risks, benefits and alternatives of the procedure were thoroughly explained, Informed consent was verified, confirmed and timeout was successfully executed by the treatment team. A digital exam revealed no abnormalities of the rectum. The Pentax Adult Colon [id number] endoscope was introduced through the anus and advanced to the ileum. (Suprep was used) excellent. The instrument was then slowly withdrawn as the colon was fully examined.Estimated blood loss is zero unless otherwise noted in this procedure report.   COLON FINDINGS: Two sessile polyps measuring 6 mm in size  were found at the cecum.  Polypectomies were performed with a cold snare.  The resection was complete, the polyp tissue was completely retrieved and sent to histology.   A sessile polyp measuring 8 mm in size was found in the distal transverse colon.  A polypectomy was performed with a cold snare.  The resection was complete, the polyp tissue was completely retrieved and sent to histology.   There was mild diverticulosis noted in the sigmoid colon.   Internal hemorrhoids were found.   Random biopsies were taken throughout the colon to rule out microscopic colitis. Retroflexed views revealed no abnormalities. The scope was then completely withdrawn from the patient and the procedure terminated. SCOPE WITHDRAWAL TIME: 12 minutes    ADVERSE EVENTS:      There were no immediate complications.  IMPRESSIONS:     1.  Two sessile polyps were found at the cecum; polypectomies were performed with a cold snare 2.  Sessile polyp was found in the distal transverse colon; polypectomy was performed with a cold snare 3.  Mild diverticulosis was noted in the sigmoid colon 4.  Internal hemorrhoids  RECOMMENDATIONS:     If the polyp(s) removed today are proven to be adenomatous (pre-cancerous) polyps, you will need a colonoscopy in 3 years.  Otherwise you should have colonoscopy every 5 years, in view of your family history of colon cancer.  You will receive a letter within 1-2 weeks with the results of your biopsy as well as final  recommendations.  Please call my office if you have not received a letter after 3 weeks. begin Flora Q 1 tab daily and fiber supplementation daily office visit 3-4 weeks resume Plavix in 5 days RECALL:  _____________________________ Inda Castle, MD eSigned:  Inda Castle, MD 11/02/2014 9:33 AM   cc:  Ria Bush, M.D.   CPT CODES: ICD CODES:  The ICD and CPT codes recommended by this software are interpretations from the data that the clinical  staff has captured with the software.  The verification of the translation of this report to the ICD and CPT codes and modifiers is the sole responsibility of the health care institution and practicing physician where this report was generated.  [employer] will not be held responsible for the validity of the ICD and CPT codes included on this report.  AMA assumes no liability for data contained or not contained herein. CPT is a Designer, television/film set of the [employer].   PATIENT NAME:  Ayesha, Markwell MR#: [id number]

## 2014-11-02 NOTE — H&P (Signed)
   History of Present Illness: Ms. Lintner has returned for evaluation of diarrhea. She was seen in November, 2015 what turned out to be an Escherichia coli infection. She claims she was treated with antibiotics but I do not see that indicated on her record. In late November she tested positive for C. difficile and was treated with Flagyl. She did well until approximately 3 weeks ago. Diarrhea has recurred and is daily event that occurs multiple times accompanied by urgency and incontinence. C. difficile from several weeks ago was negative. She was empirically treated with Flagyl without improvement. She complains of nausea and mild abdominal discomfort.  Stool pathogen panel was negative and patient did not improve after another round of vancomycin.    Review of Systems: Pertinent positive and negative review of systems were noted in the above HPI section. All other review of systems were otherwise negative.    Current Medications, Allergies, Past Medical History, Past Surgical History, Family History and Social History were reviewed in [place] record  Vital signs were reviewed in today's medical record. Physical Exam: General: Slightly ill-appearing female in no acute distress On abdominal exam is mild. Umbilical tenderness without guarding or rebound  Impression - persistent diarrhea with history of pseudomembranous colitis several months ago   Plan-colonoscopy

## 2014-11-02 NOTE — Discharge Instructions (Signed)
Resume Plavix in 5 days  Colonoscopy, Care After These instructions give you information on caring for yourself after your procedure. Your doctor may also give you more specific instructions. Call your doctor if you have any problems or questions after your procedure. HOME CARE  Do not drive for 24 hours.  Do not sign important papers or use machinery for 24 hours.  You may shower.  You may go back to your usual activities, but go slower for the first 24 hours.  Take rest breaks often during the first 24 hours.  Walk around or use warm packs on your belly (abdomen) if you have belly cramping or gas.  Drink enough fluids to keep your pee (urine) clear or pale yellow.  Resume your normal diet. Avoid heavy or fried foods.  Avoid drinking alcohol for 24 hours or as told by your doctor.  Only take medicines as told by your doctor. If a tissue sample (biopsy) was taken during the procedure:   Do not take aspirin or blood thinners for 7 days, or as told by your doctor.  Do not drink alcohol for 7 days, or as told by your doctor.  Eat soft foods for the first 24 hours. GET HELP IF: You still have a small amount of blood in your poop (stool) 2-3 days after the procedure. GET HELP RIGHT AWAY IF:  You have more than a small amount of blood in your poop.  You see clumps of tissue (blood clots) in your poop.  Your belly is puffy (swollen).  You feel sick to your stomach (nauseous) or throw up (vomit).  You have a fever.  You have belly pain that gets worse and medicine does not help. MAKE SURE YOU:  Understand these instructions.  Will watch your condition.  Will get help right away if you are not doing well or get worse. Document Released: 08/09/2010 Document Revised: 07/12/2013 Document Reviewed: 03/14/2013 [facility] Patient Information 2015 [place], [place]. This information is not intended to replace advice given to you by your health care provider. Make sure you discuss  any questions you have with your health care provider.   Conscious Sedation, Adult, Care After Refer to this sheet in the next few weeks. These instructions provide you with information on caring for yourself after your procedure. Your health care provider may also give you more specific instructions. Your treatment has been planned according to current medical practices, but problems sometimes occur. Call your health care provider if you have any problems or questions after your procedure. WHAT TO EXPECT AFTER THE PROCEDURE  After your procedure:  You may feel sleepy, clumsy, and have poor balance for several hours.  Vomiting may occur if you eat too soon after the procedure. HOME CARE INSTRUCTIONS  Do not participate in any activities where you could become injured for at least 24 hours. Do not:  Drive.  Swim.  Ride a bicycle.  Operate heavy machinery.  Cook.  Use power tools.  Climb ladders.  Work from a high place.  Do not make important decisions or sign legal documents until you are improved.  If you vomit, drink water, juice, or soup when you can drink without vomiting. Make sure you have little or no nausea before eating solid foods.  Only take over-the-counter or prescription medicines for pain, discomfort, or fever as directed by your health care provider.  Make sure you and your family fully understand everything about the medicines given to you, including what side effects may  occur.  You should not drink alcohol, take sleeping pills, or take medicines that cause drowsiness for at least 24 hours.  If you smoke, do not smoke without supervision.  If you are feeling better, you may resume normal activities 24 hours after you were sedated.  Keep all appointments with your health care provider. SEEK MEDICAL CARE IF:  Your skin is pale or bluish in color.  You continue to feel nauseous or vomit.  Your pain is getting worse and is not helped by  medicine.  You have bleeding or swelling.  You are still sleepy or feeling clumsy after 24 hours. SEEK IMMEDIATE MEDICAL CARE IF:  You develop a rash.  You have difficulty breathing.  You develop any type of allergic problem.  You have a fever. MAKE SURE YOU:  Understand these instructions.  Will watch your condition.  Will get help right away if you are not doing well or get worse. Document Released: 04/27/2013 Document Reviewed: 04/27/2013 [facility] Patient Information 2015 [place], [place]. This information is not intended to replace advice given to you by your health care provider. Make sure you discuss any questions you have with your health care provider.

## 2014-11-03 ENCOUNTER — Encounter (HOSPITAL_COMMUNITY): Payer: Self-pay | Admitting: Gastroenterology

## 2014-11-03 ENCOUNTER — Other Ambulatory Visit: Payer: Self-pay

## 2014-11-03 ENCOUNTER — Ambulatory Visit (INDEPENDENT_AMBULATORY_CARE_PROVIDER_SITE_OTHER): Payer: Medicare Other | Admitting: Internal Medicine

## 2014-11-03 ENCOUNTER — Encounter: Payer: Self-pay | Admitting: Gastroenterology

## 2014-11-03 VITALS — BP 104/76 | HR 66 | Temp 97.8°F | Wt 141.5 lb

## 2014-11-03 DIAGNOSIS — R112 Nausea with vomiting, unspecified: Secondary | ICD-10-CM | POA: Diagnosis not present

## 2014-11-03 DIAGNOSIS — I2510 Atherosclerotic heart disease of native coronary artery without angina pectoris: Secondary | ICD-10-CM

## 2014-11-03 DIAGNOSIS — R197 Diarrhea, unspecified: Secondary | ICD-10-CM | POA: Diagnosis not present

## 2014-11-03 MED ORDER — FLORA-Q PO CAPS: 1.0000 | ORAL_CAPSULE | Freq: Every day | ORAL | Status: DC

## 2014-11-03 MED ORDER — CHOLESTYRAMINE LIGHT 4 G PO PACK: 4.0000 g | PACK | Freq: Two times a day (BID) | ORAL | Status: DC

## 2014-11-03 NOTE — Progress Notes (Signed)
Pre visit review using our clinic review tool, if applicable. No additional management support is needed unless otherwise documented below in the visit note. 

## 2014-11-03 NOTE — Patient Instructions (Signed)

## 2014-11-03 NOTE — Progress Notes (Signed)
Subjective:    Patient ID: Sarah Payne, female    DOB: 12/15/1945, 69 y.o.   MRN: [id number]  HPI  Pt presents to the clinic today withy c/o vomiting and diarrhea. She had a colonoscopy yesterday by Dr. Deatra Ina. He excised a few polyps, found some internal hemorrhoids and diverticulosis. This has been a persistent issue for her since 05/2014. She developed CDIFF at that time and was treated with a course of Flagyl. She had recurrent symptoms 08/30/2014, and although her CDIFF culture at that time was negative, she was treated empirically with Flagyl again, although this time her symptoms did not improve. She saw Dr. Deatra Ina 09/27/14 for the same. He suggested that they repeat stool studies and obtain a colonoscopy, which she had done yesterday. The stool study results are not yet available. She is concerned because she is having 3-4 loose stools per day, it is not watery or bloody. She has associated nausea with occasional vomiting but denies abdominal cramping. She reports she has decreased appetite, she reports she has lost 15 lbs since this all started but looking in the EMR, her weight is stable. She is taking Phenergan but she reports it does not help at all. She is not taking any Zofran. She has not tried OTC. She has not follow up appt scheduled with GI.  Review of Systems      Past Medical History  Diagnosis Date  . Diabetes mellitus   . Hypertension   . Hypothyroidism   . Depression   . Vascular dementia   . Tobacco abuse     a. 45 pack year history - quit 09/16/11  . Chronic low back pain   . Coronary artery disease     a. s/p PCI/DES LCX x 3;  b. NSTEMI 09/16/11;  c.   09/17/11 Cath [facility]) Severe RCA/LCX dzs - RCA treated, LCX intervention pending w/ Dr. Clayborn Bigness  . Sleep apnea     mild-does not use cpap  . Impingement syndrome of left shoulder   . Tendonitis of left rotator cuff   . Diverticulosis     Current Outpatient Prescriptions  Medication Sig Dispense Refill  .  albuterol (PROVENTIL HFA;VENTOLIN HFA) 108 (90 BASE) MCG/ACT inhaler Inhale 2 puffs into the lungs every 6 (six) hours as needed for wheezing. 1 Inhaler 11  . aspirin 81 MG tablet Take 81 mg by mouth daily.      Marland Kitchen atorvastatin (LIPITOR) 40 MG tablet Take 1 tablet (40 mg total) by mouth daily. 90 tablet 3  . Calcium & Magnesium Carbonates (MYLANTA PO) Take by mouth.      . carvedilol (COREG) 3.125 MG tablet Take 2 tablets (6.25 mg total) by mouth 2 (two) times daily. 180 tablet 3  . clonazePAM (KLONOPIN) 0.5 MG tablet Take 1 tablet (0.5 mg total) by mouth at bedtime as needed for anxiety. 20 tablet 1  . clopidogrel (PLAVIX) 75 MG tablet TAKE 1 TABLET (75 MG TOTAL) BY MOUTH DAILY. 90 tablet 3  . cyanocobalamin (,VITAMIN B-12,) 1000 MCG/ML injection Inject 1 mL (1,000 mcg total) into the muscle every 30 (thirty) days. INJECT 1 ML (1,000 MCG TOTAL) INTO THE MUSCLE ONCE. 1 mL 0  . diazepam (VALIUM) 2 MG tablet Take 1 tablet (2 mg total) by mouth every 8 (eight) hours as needed for muscle spasms. 20 tablet 0  . DULoxetine (CYMBALTA) 60 MG capsule TAKE 1 CAPSULE BY MOUTH DAILY. 90 capsule 3  . enalapril (VASOTEC) 5 MG tablet Take 1  tablet (5 mg total) by mouth daily. 90 tablet 3  . escitalopram (LEXAPRO) 10 MG tablet Take 1 tablet (10 mg total) by mouth at bedtime. 30 tablet 0  . fluticasone (FLOVENT HFA) 220 MCG/ACT inhaler Inhale 1 puff into the lungs 2 (two) times daily. 2 Inhaler 1  . glipiZIDE (GLUCOTROL) 10 MG tablet Before breakfast and supper (evening meal) 180 tablet 1  . glucose blood (FREESTYLE LITE) test strip Check blood sugar 3 times daily to check blood sugars - Uncontrolled diabetes mellitus 300 each 3  . glucose monitoring kit (FREESTYLE) monitoring kit 1 each by Does not apply route as needed for other. PHARMACY PLEASE SUBSTITUTE WHATEVER GLUCOMETER YOU CARRY 1 each 1  . insulin glargine (LANTUS) 100 UNIT/ML injection 15 units qhs 10 mL 0  . isosorbide mononitrate (IMDUR) 30 MG 24 hr  tablet Take 1 tablet (30 mg total) by mouth daily. 90 tablet 3  . Levothyroxine Sodium 150 MCG CAPS Take 1 capsule (150 mcg total) by mouth daily before breakfast. 30 capsule 0  . metFORMIN (GLUCOPHAGE) 500 MG tablet Take 2 tablets (1,000 mg total) by mouth 2 (two) times daily with a meal. 180 tablet 3  . metroNIDAZOLE (FLAGYL) 500 MG tablet Take 1 tablet (500 mg total) by mouth 3 (three) times daily. Avoid alcohol 42 tablet 0  . Na Sulfate-K Sulfate-Mg Sulf (SUPREP BOWEL PREP) SOLN USE PER PREP INSTRUCTIONS 1 Bottle 0  . nitroGLYCERIN (NITROSTAT) 0.4 MG SL tablet Place 1 tablet (0.4 mg total) under the tongue every 5 (five) minutes as needed. 25 tablet 2  . omeprazole (PRILOSEC) 20 MG capsule Take 1 capsule (20 mg total) by mouth 2 (two) times daily before a meal. 60 capsule 11  . ondansetron (ZOFRAN) 4 MG tablet Take 1 tablet (4 mg total) by mouth 3 (three) times daily as needed for nausea or vomiting. 30 tablet 0  . promethazine (PHENERGAN) 12.5 MG tablet Take 1 tablet (12.5 mg total) by mouth every 8 (eight) hours as needed for nausea or vomiting. 20 tablet 0  . sitaGLIPtin (JANUVIA) 100 MG tablet Take 1 tablet (100 mg total) by mouth daily. 90 tablet 3  . SUPREP BOWEL PREP SOLN Take 1 kit by mouth once. 354 mL 0  . tiotropium (SPIRIVA HANDIHALER) 18 MCG inhalation capsule Place 1 capsule (18 mcg total) into inhaler and inhale daily. 30 capsule 2  . traZODone (DESYREL) 100 MG tablet TAKE 1 TABLET (100 MG TOTAL) BY MOUTH AT BEDTIME. 90 tablet 3   No current facility-administered medications for this visit.    Allergies  Allergen Reactions  . No Known Allergies     Family History  Problem Relation Age of Onset  . Heart attack Mother     First MI @ 67 - Died @ 20  . Heart disease Mother   . Heart disease Father     Died @ 94  . Throat cancer Brother   . Liver cancer Brother   . Colon cancer Sister     History   Social History  . Marital Status: Married    Spouse Name: N/A  .  Number of Children: N/A  . Years of Education: N/A   Occupational History  . Not on file.   Social History Main Topics  . Smoking status: Current Every Day Smoker -- 0.25 packs/day for 45 years    Types: Cigarettes  . Smokeless tobacco: Never Used     Comment: Has cut back, trying to quit.   Marland Kitchen  Alcohol Use: No  . Drug Use: Yes    Special: Marijuana     Comment: last night 4.13.16  . Sexual Activity: Not on file   Other Topics Concern  . Not on file   Social History Narrative   Lives at home with her husband in [place].  Previously used marijuana - quit.      Regular exercise: no/ pain from a frozen rotator cuff   Caffeine use: coffee daily and pepsi     Constitutional: Denies fever, malaise, fatigue, headache or abrupt weight changes.  Respiratory: Denies difficulty breathing, shortness of breath, cough or sputum production.   Cardiovascular: Denies chest pain, chest tightness, palpitations or swelling in the hands or feet.  Gastrointestinal: Pt reports vomiting and diarrhea. Denies abdominal pain, bloating, constipation, or blood in the stool.   No other specific complaints in a complete review of systems (except as listed in HPI above).  Objective:   Physical Exam   BP 104/76 mmHg  Pulse 66  Temp(Src) 97.8 F (36.6 C) (Oral)  Wt 141 lb 8 oz (64.184 kg)  SpO2 97% Wt Readings from Last 3 Encounters:  11/03/14 141 lb 8 oz (64.184 kg)  10/25/14 140 lb (63.504 kg)  09/27/14 139 lb 8 oz (63.277 kg)    General: Appears her stated age, chronically ill appearing in NAD. Skin: Warm, dry and intact.  Cardiovascular: Normal rate and rhythm. S1,S2 noted.  No murmur, rubs or gallops noted.  Pulmonary/Chest: Normal effort and positive vesicular breath sounds. No respiratory distress. No wheezes, rales or ronchi noted.  Abdomen: Soft and nontender. Hyperactivee bowel sounds, no bruits noted. No distention or masses noted.   BMET    Component Value Date/Time   NA 139  08/30/2014 1356   NA 134 08/11/2012 1603   K 3.8 08/30/2014 1356   CL 102 08/30/2014 1356   CO2 31 08/30/2014 1356   GLUCOSE 156* 08/30/2014 1356   GLUCOSE 166* 08/11/2012 1603   BUN 15 08/30/2014 1356   BUN 17 08/11/2012 1603   CREATININE 1.02 08/30/2014 1356   CREATININE 1.07 11/24/2011 1541   CALCIUM 9.5 08/30/2014 1356   GFRNONAA 68 08/11/2012 1603   GFRNONAA 55* 11/24/2011 1541   GFRAA 78 08/11/2012 1603   GFRAA 64 11/24/2011 1541    Lipid Panel     Component Value Date/Time   CHOL 287* 04/26/2014 0801   TRIG * 04/26/2014 0801    425.0 Triglyceride is over 400; calculations on Lipids are invalid.   HDL 34.30* 04/26/2014 0801   CHOLHDL 8 04/26/2014 0801   VLDL 85.0* 04/26/2014 0801   LDLCALC 70 04/30/2011 1341    CBC    Component Value Date/Time   WBC 6.4 08/30/2014 1356   WBC 12.1* 08/11/2012 1603   RBC 3.60* 08/30/2014 1356   RBC 4.30 08/11/2012 1603   HGB 11.4* 08/30/2014 1356   HCT 34.1* 08/30/2014 1356   PLT 315.0 08/30/2014 1356   MCV 94.8 08/30/2014 1356   MCH 32.6 08/11/2012 1603   MCHC 33.6 08/30/2014 1356   MCHC 34.7 08/11/2012 1603   RDW 16.1* 08/30/2014 1356   RDW 13.5 08/11/2012 1603   LYMPHSABS 1.9 08/30/2014 1356   LYMPHSABS 2.9 08/11/2012 1603   MONOABS 0.4 08/30/2014 1356   EOSABS 0.0 08/30/2014 1356   EOSABS 0.1 08/11/2012 1603   BASOSABS 0.0 08/30/2014 1356   BASOSABS 0.0 08/11/2012 1603    Hgb A1C Lab Results  Component Value Date   HGBA1C 8.8* 04/26/2014  Assessment & Plan:   Nausea, Vomiting, and Diarrhea, persistent:  Dr. Kelby Fam colonoscopy note reviewed 2 most recent office visits related to this problem reviewed Recent labwork reviewed Advised her to stop Phenergan and start Zofran Try Imodium no more than 1 tab BID for now Try a probiotic like Align Go ahead and schedule a GI appt with Dr. Deatra Ina  RTC as needed or if symptoms persist or worsen

## 2014-11-07 ENCOUNTER — Ambulatory Visit: Admitting: Family Medicine

## 2014-11-10 NOTE — Consult Note (Signed)
General Aspect 69 year old woman with a long history of smoking, coronary artery disease, prior stent x3 placed to the mid left circumflex with moderate to severe LAD disease , stent placed to her distal RCA in February 2013, stent to the mid left circumflex in March 2013 , poorly controlled diabetes, depression, hypertension, who presents to the office 08/11/2012 with worsening chest pain .  She has had problems in the past with medication compliance including taking her Plavix . She reports today having worsening chest pain over the past month. Chest pain on daily basis having to take nitroglycerin sometimes 2 at a time for chest pain symptoms. Symptoms occur at rest and with stress, intensity 6-7/10. She continues to smoke 1-1/2 packs per day. Recently changed to insulin and her sugar levels have been running 400 on a consistent basis. She takes 20 units in the evening .    she reports her chest pain symptoms are consistent with previous blockage requiring stent.   Echocardiogram February 07, 2011 shows normal LV systolic function, diastolic dysfunction, moderate inferior wall hypokinesis, normal right ventricular systolic pressures.  EKG shows normal sinus rhythm with rate 68 beats per minute with T-wave abnormality in leads V3 to V6, one and aVL    Present Illness . Outpatient Encounter Prescriptions as of 08/11/2012   Medication  Sig  Dispense  Refill   ???  aspirin 81 MG tablet  Take 81 mg by mouth daily.            ???  carvedilol (COREG) 6.25 MG tablet  Take 1 tablet (6.25 mg total) by mouth 2 (two) times daily.   180 tablet   3   ???  clopidogrel (PLAVIX) 75 MG tablet  Take 1 tablet (75 mg total) by mouth daily.   90 tablet   3       ???  donepezil (ARICEPT) 5 MG tablet  Take 1 tablet (5 mg total) by mouth at bedtime as needed.   90 tablet   ???  DULoxetine (CYMBALTA) 60 MG capsule  Take 1 capsule (60 mg total) by mouth daily.   30 capsule   2   ???  enalapril (VASOTEC) 10 MG tablet  Take  1 tablet (10 mg total) by mouth daily.   30 tablet   3   ???  glipiZIDE (GLUCOTROL) 10 MG tablet  Before breakfast and supper (evening meal)   60 tablet   3   ???  HYDROcodone-acetaminophen (NORCO) 10-325 MG per tablet  Take 1 tablet by mouth every 8 (eight) hours as needed for pain.   90 tablet   1  insulin detemir (LEVEMIR FLEXPEN) 100 UNIT/ML injection  Inject 20 Units into the skin at bedtime.   15 mL   12   ???  levothyroxine (SYNTHROID, LEVOTHROID) 125 MCG tablet  Take 1 tablet (125 mcg total) by mouth daily.   90 tablet   3   ???  pravastatin (PRAVACHOL) 80 MG tablet  Take 1 tablet (80 mg total) by mouth every evening.   90 tablet   3   ???  sitaGLIPtin (JANUVIA) 100 MG tablet  Take 1 tablet (100 mg total) by mouth daily.   30 tablet   11  traZODone (DESYREL) 100 MG tablet  Take 1 tablet (100 mg total) by mouth at bedtime.   90 tablet   3   ???  zolpidem (AMBIEN) 5 MG tablet  Take 2 tablets (10 mg total) by mouth at bedtime as needed  for sleep.   60 tablet   3   ???  isosorbide mononitrate (IMDUR) 30 MG 24 hr tablet  Take 1 tablet (30 mg total) by mouth daily.   Lab Results: Cardiology:  30-Jan-14 10:05    Ventricular Rate 57   Atrial Rate 57   P-R Interval 214   QRS Duration 86   QT 432   QTc 420   P Axis 73   R Axis 68   T Axis 91   ECG interpretation Sinus bradycardia with 1st degree A-V block Otherwise normal ECG When compared with ECG of 02-Oct-2011 14:27, No significant change was found ----------unconfirmed---------- Confirmed by OVERREAD, NOT ([id number]), editor PEARSON, BARBARA ([id number]) on 08/19/2012 12:13:44 PM    No Known Allergies:     Impression Assessment and Plan       Angina at rest -  Worsening chest pain concerning for underlying coronary artery disease. We'll start her on isosorbide mononitrate 30 mg daily for symptom relief. Asked her to stay on aspirin and Plavix. We'll schedule a cardiac catheterization for 08/19/2012  Diabetes mellitus type 2 with peripheral  artery disease -  She reports sugar levels in the 400s. We have suggested that she call Dr. Derrel Nip. She may want to increase her insulin this evening and take additional insulin in the morning.  Tobacco abuse -  She reports that she is going to try an electronic cigarette. We have strongly encouraged her to stop smoking.  Hyperlipidemia -  Cholesterol should improve as diabetes numbers improve   Electronic Signatures: Ida Rogue (MD)  (Signed 30-Jan-14 13:51)  Authored: General Aspect/Present Illness, Labs, Allergies, Impression/Plan   Last Updated: 30-Jan-14 13:51 by Ida Rogue (MD)

## 2014-11-10 NOTE — Discharge Summary (Signed)
Dates of Admission and Diagnosis:   Date of Admission 19-Aug-2012    Date of Discharge 20-Aug-2012    Admitting Diagnosis angina, CAD    Final Diagnosis angina,    Discharge Diagnosis 1 right coronary artery stenosis    2 s/p PCI with stent x 2 placed to proximal to mid RCA    3 smoker    4 PAD     Chief Complaint/History of Present Illness chest pain as an outpt, worsening angina cardiac cath scheduled for 08/19/2012   Routine Chem:  31-Jan-14 04:19    BUN 13   Creatinine (comp) 0.71   Sodium, Serum 138   Potassium, Serum 3.7   Chloride, Serum 103   CO2, Serum 29   Calcium (Total), Serum 9.0   Anion Gap  6   Osmolality (calc) 281   eGFR (African American) >60   eGFR (Non-African American) >60 (eGFR values <38m/min/1.73 m2 may be an indication of chronic kidney disease (CKD). Calculated eGFR is useful in patients with stable renal function. The eGFR calculation will not be reliable in acutely ill patients when serum creatinine is changing rapidly. It is not useful in  patients on dialysis. The eGFR calculation may not be applicable to patients at the low and high extremes of body sizes, pregnant women, and vegetarians.)   Hospital Course:   Hospital Course cardiac cath yesterday: stent x 2 placed to RCA also with moderate mid LAd disease (medical management recommended for this lesion)    Condition on Discharge Good   DISCHARGE INSTRUCTIONS HOME MEDS:  Medication Reconciliation:  Patient's Home Medications at Discharge:     Medication Instructions  levothyroxine 125 mcg (0.125 mg) oral tablet  1 tab(s) orally once a day in the morning ..Marland Kitchen0 mins prior to food and other medications   clopidogrel 75 mg oral tablet  1 tab(s) orally once a day   trazodone 100 mg oral tablet  1 tab(s) orally once a day (at bedtime)   nitrostat 0.4 mg sublingual tablet  0.5 tab(s) sublingual every 5 minutes, As Needed for chest pain not relieved with mylanta   vitamin b-12 1000  mcg/ml injectable solution  Inject 1 milliliter(s) subcutaneous once a month. **pt last shot was on 08/2011**   vasotec 10 mg oral tablet  1 tab(s) orally once a day   coreg 6.25 mg oral tablet  1 tab(s) orally 2 times a day   cymbalta 661moral delayed release capsule  1   once a day   glipizide 10 mg oral tablet, extended releas  1   once a day   mylanta   orally ,no dose specified   peridex 0.12% mucous membrane liquid  15 milliliter(s) mucous membrane 2 times a day   temovate 0.05% topical cream  Apply topically to affected area    aricept 5 mg oral tablet  1 tab(s) orally once a day (at bedtime), As Needed   norco 10 mg-325 mg oral tablet  tab(s) orally  every 8 hours ,for Pain   levemir flexpen 100 units/ml subcutaneous solution  20 unit(s) subcutaneous once a day (at bedtime)   magnesium oxide 400 mg (241.3 mg elemental magnesium) oral tablet  1 tab(s) orally once a day   pravastatin 80 mg oral tablet  1 tab(s) orally once a day (at bedtime)   januvia 100 mg oral tablet  1 tab(s) orally once a day   ambien 5 mg oral tablet  1 tab(s) orally once a day (at bedtime), As  Needed   imdur 30 mg oral tablet, extended release  1 tab(s) orally once a day (in the morning)   aspirin enteric coated 325 mg oral delayed release tablet  1 tab(s) orally once a day   aspirin 325 mg oral delayed release tablet  1 tab(s) orally once a day   glipizide 10 mg oral tablet  1 tab(s) orally 2 times a day (before meals)   duloxetine 60 mg oral delayed release capsule  1 cap(s) orally once a day     PRESCRIPTIONS: no new medications   STOP TAKING THE FOLLOWING MEDICATION(S):    zofran odt 8 mg oral tablet, disintegrating: 1 tab(s) orally every 8 hours, As Needed- for Nausea, Vomiting   Physician's Instructions:   Home Health? No    Treatments None    Dressing Care Replace dressing as necessary.  May shower.    Home Oxygen? No    Diet Regular    Diet Consistency Regular Consistency    Activity  Limitations As tolerated  No heavy lifting  no heavy lifting until wednesday, 08/25/12    Referrals None    Return to Work 1 week    Time frame for Follow Up Appointment 1-2 weeks   Electronic Signatures: Ida Rogue (MD)  (Signed 31-Jan-14 10:56)  Authored: ADMISSION DATE AND DIAGNOSIS, CHIEF COMPLAINT/HPI, PERTINENT [facility] MEDS, PATIENT INSTRUCTIONS   Last Updated: 31-Jan-14 10:56 by Ida Rogue (MD)

## 2014-11-11 NOTE — H&P (Signed)
PATIENT NAME:  Sarah Payne, GRULKE MR#:  [id number] DATE OF BIRTH:  1946/04/14  DATE OF ADMISSION:  08/19/2013  PRIMARY CARE PHYSICIAN: Deborra Medina, MD  CARDIOLOGIST: Minna Merritts, MD  The patient is a 69 year old Caucasian female with past medical history significant for history of diabetes mellitus, hypertension, hyperlipidemia, coronary artery disease, status post MI in February 2013 with multiple angioplasties in the past, most recent in January 2014 to proximal  RCA, 2 stents were placed, who presents to the hospital with complaints of feeling weak and not staying awake. Apparently, the patient was also complaining of chest pains. The patient describes the chest pain as achiness in the mid to left side of the sternal area, radiating to  between shoulder blades area. She had this pain on Tuesday all day long, with some help with nitroglycerin. She is also complaining of not being able to stay awake. She was apparently evaluated for the  obstructive sleep apnea per recommendations of Dr. Rockey Situ, and she was found not to have obstructive sleep apnea. However, she also admits of smoking marijuana and tells me that she does this at least twice a day.   PAST MEDICAL HISTORY: Significant for history of coronary artery disease, status post non-Q-wave MI in February 2013. The patient had 3 stents to mid left circumflex, as well as moderate to severe left anterior descending  coronary artery stenosis, stents in distal RCA were placed in February 2013, stents in mid left circumflex in March 2013, status post 2 stents in proximal to mid RCA in January 2014. History of hypertension, hyperlipidemia, diabetes mellitus, depression, vascular dementia, gastroesophageal reflux disease, chronic back pain, diabetic gastropathy, hypothyroidism, chronic smoker with history of ongoing smoking, history of depression, marijuana use in the past as well as currently.  PAST SURGICAL HISTORY: Back surgery, hysterectomy,  appendectomy.  MEDICATIONS: According to medical records, the patient is on albuterol nebulizers every 6 hours as needed, aspirin 81 mg p.o. daily, calcium with magnesium once daily, carvedilol 6.25 mg p.o. twice daily, clobetasol 0.05% to vaginal folds daily, clopidogrel 75 mg p.o. daily, vitamin B12 with 1000 mcg intramuscularly once monthly, duloxetine 60 mg p.o. daily, donepezil 5 mg p.o. at bedtime, enalapril 10 mg p.o. daily, etodolac 400 mg p.o. twice daily, fluticasone propionate 220 mcg inhalation twice daily, gabapentin 100 mg p.o. 3 times daily, glipizide 10 mg p.o. twice daily, insulin N 20 units twice daily, isosorbide mononitrate 30 mg p.o. daily, levothyroxine 137 mcg p.o. daily, magnesium oxide 400 mg p.o. daily, metformin 500 mg p.o. twice daily, nitroglycerin 0.4 mg p.o. every 5 minutes as needed, Zofran 8 mg every 8 hours as needed, Januvia 100 mg p.o. daily, Spiriva 18 mcg inhalation daily, Desyrel 100 mg p.o. at bedtime, and it is unclear if she is taking 200 mg or 100 mg of trazodone (which is Desyrel).   FAMILY HISTORY: Hypertension in the patient's mother, CVA, myocardial infarction. The patient's father was healthy. The patient's brother died of throat cancer.   SOCIAL HISTORY: The patient is extensive tobacco user, 1 pack a day for the past 35 years or so. No alcohol abuse, marijuana use. She is married and lives with her husband.   REVIEW OF SYSTEMS: Positive for chest pains, weakness, fatigue, feeling cold all the time, weight loss of approximately 10 pounds in the past one month, poor p.o. intake, some cough as well as wheezes as well as shortness of breath, intermittent arrhythmias as well as dyspnea on exertion as well as feeling  lightheaded, and some intermittent diarrhea.  CONSTITUTIONAL: Otherwise denies any fevers, weight gain.  EYES: Denies any double vision, glaucoma or cataracts.  EARS, NOSE, THROAT: Denies any tinnitus, allergies, epistaxis, sinus pain, dentures,  difficulty swallowing. RESPIRATORY: Denies any hemoptysis, asthma, COPD.  CARDIOVASCULAR: Denies any  orthopnea, edema, palpitations or syncope.  GASTROINTESTINAL: Denies any nausea, vomiting, rectal bleeding, change in bowel habits.  GENITOURINARY: Denies dysuria, hematuria, frequency or incontinence. ENDOCRINE: Denies any polydipsia, nocturia, thyroid problems, heat or cold intolerance or thirst. HEMATOLOGIC: Denies any anemia, easy bruising, bleeding, swollen glands. SKIN: Denies any acne, rashes, lesions, change in moles. MUSCULOSKELETAL: Denies arthritis, cramps, swelling.  NEUROLOGIC: No numbness, epilepsy or tremor. PSYCHIATRIC: Denies anxiety, insomnia, depression.   PHYSICAL EXAMINATION:   VITAL SIGNS: On arrival to the hospital, the patient's temperature is 97.7, pulse 81, respiration rate 18, blood pressure 143/92. Saturation was 98% on room air. GENERAL: This is a well-developed, well-nourished Caucasian female, pale and grayish looking, lying on the stretcher.  HEENT: Her pupils are equal, reactive to light. Extraocular movements intact. No icterus or conjunctivitis. Has normal hearing. No pharyngeal erythema. Mucosa is moist.  NECK: No masses. Supple, nontender. Thyroid not enlarged. No adenopathy. No JVD or carotid bruits bilaterally. Full range of motion.  LUNGS: Clear to auscultation in all fields. A few crackles were heard anteriorly, but there were no significant rales, rhonchi or wheezing. No labored inspirations, increased effort, dullness to percussion, overt respiratory distress.  CARDIOVASCULAR: S1, S2 appreciated. No murmurs, gallops or rubs noted. PMI not lateralized. Chest is nontender to palpation. Pedal pulses 1+. No lower extremity edema, calf tenderness or cyanosis was noted.  ABDOMEN: Soft, nontender. Bowel sounds were present. No hepatosplenomegaly or masses were noted.  RECTAL: Deferred.  MUSCULOSKELETAL: Muscle strength: Able to move all extremities. No  cyanosis, degenerative joint disease or kyphosis. Gait was not tested.  SKIN: Denies any rashes, lesion, erythema, nodularity or induration.  BACK: There was no tenderness on spine palpation or thoracic spine palpation or CVA tenderness on percussion.  PSYCHIATRIC: The patient is alert, oriented to person and place, cooperative. Memory is good. No significant confusion, agitation or depression was noted.  LABORATORY DATA: Glucose 180, BUN 20, sodium 133; otherwise, BMP was unremarkable. Cardiac enzymes: Troponin was less than 0.02. CBC within normal limits, except absolute neutrophil count was elevated at 7.0, which was about 68%. Urinalysis revealed 5 white blood cells and positive for nitrites, 1+ leukocyte esterase.   ASSESSMENT AND PLAN: 1.  Chest pain, concerning for possible unstable angina: Admit patient to medical floor. Continue her on Coreg, nitroglycerin, Plavix, as well as aspirin. Get cardiology consultation. I discussed the patient's case with Dr. Fletcher Anon, and cardiology will see her shortly. Questionable cardiac catheterization.  2.  Hypertension: We will continue medications as well as adding nitroglycerin topically. 3.  Diabetes mellitus: Will get hemoglobin A1c. Will continue home medications, except metformin for possible cardiac catheterization.  4.  Somnolence: Get urine drug screen as well as TSH. Will follow.  5.  Tobacco abuse: Discussed cessation for about 4 minutes. Nicotine replacement therapy will be initiated.  6.  Questionable urinary tract infection: Get urine cultures. Start the patient on antibiotics orally.   TIME SPENT: 50 minutes.    ____________________________ Theodoro Grist, MD rv:jcm D: 08/19/2013 15:00:10 ET T: 08/19/2013 15:34:55 ET JOB#: [id number]  cc: Theodoro Grist, MD, <Dictator> Minna Merritts, MD Deborra Medina, MD  [place] MD ELECTRONICALLY SIGNED 09/20/2013 19:43

## 2014-11-11 NOTE — Discharge Summary (Signed)
PATIENT NAME:  Sarah Payne, Sarah Payne MR#:  [id number] DATE OF BIRTH:  [birth year]  DATE OF ADMISSION:  08/19/2013 DATE OF DISCHARGE:  08/20/2013  For a detailed note, please take a look at the history and physical done on admission by Dr. Ether Griffins.   DIAGNOSES AT DISCHARGE: As follows: 1.  Chest pain, likely related to stable angina. 2.  History of coronary artery disease, status post percutaneous coronary intervention. 3.  Chronic obstructive pulmonary disease.  4.  Ongoing tobacco abuse.  5.  Diabetes.  6.  Diabetic neuropathy.  7.  Hypothyroidism.  8.  Dementia.  9.  Depression.   DIET: The patient is being discharged on a low-sodium, low-fat, American Diabetic Association diet.   ACTIVITY: As tolerated.   FOLLOWUP: With Dr. Fletcher Anon in the next 1 to 2 weeks. Also follow up with Dr. Derrel Nip in the next 1 to 2 weeks.   DISCHARGE MEDICATIONS: Plavix 75 mg daily, trazodone 100 mg at bedtime, Vasotec 10 mg daily, Coreg 6.25 mg b.i.d., Mylanta as needed, magnesium oxide 400 mg daily, Januvia 100 mg daily, Imdur 30 mg daily, glipizide 10 mg b.i.d., aspirin 81 mg daily, Cymbalta 60 mg daily, Synthroid 137 mcg daily, sublingual nitroglycerin as needed, Norco 10/325 one tab q.6 hours as needed, vitamin B12 with 1000 mcg monthly, albuterol nebulizer q.6 hours as needed, Aricept 5 mg at bedtime, Spiriva 1 puff daily, clobetasol topical ointment to be applied daily as needed, Zofran 8 mg disintegrating tab q.8 hours as needed, metformin 500 mg 2 tabs b.i.d., gabapentin 100 mg t.i.d., Flovent 1 puff b.i.d., etodolac 400 mg b.i.d., Pravachol 20 mg at bedtime.   CONSULTANTS DURING THE HOSPITAL COURSE: Dr. Rockey Situ from cardiology.   PERTINENT STUDIES DONE DURING THE HOSPITAL COURSE: As follows: Three sets of cardiac markers checked which were negative. Urine toxicology positive for marijuana and tricyclic antidepressants. A chest x-ray done on admission showing no acute cardiopulmonary disease.   HOSPITAL COURSE:  This is a 69 year old female with medical problems as mentioned above, presented to the hospital due to chest pain.  1.  Chest pain: The most likely cause of the patient's chest pain was related to her stable angina. The patient apparently had been out of her home medications including her aspirin, Plavix, her Imdur for about 2 weeks prior to coming to the hospital and was not taking them. She was seen by cardiology, who did not think that the patient needed acute cardiac intervention but continue aggressive medical management. She was maintained back on her aspirin, Plavix, and beta blocker. She was actually not on a statin, which was started prior to discharge. She is currently chest pain-free and hemodynamically stable, and her cardiac markers were negative. She is therefore being discharged home with close followup with her cardiologist, Dr. Rockey Situ, as an outpatient.  2.  Diabetes: The patient will resume her metformin, Januvia, and glipizide. Her blood sugars remain stable.  3.  Diabetic neuropathy: The patient was maintained on Neurontin. She will resume that.  4.  Hypertension: The patient was maintained on her Coreg and enalapril. She will resume that.  5.  Hypothyroidism: The patient's TSH was significantly elevated. This was likely secondary to noncompliance, as she had not taken her medications in 2 weeks. She will resume her Synthroid and likely have a TSH and thyroid function tests checked in the next 6 weeks.  6.  Dementia: The patient was maintained on her Aricept, and she will resume that upon discharge too.  7.  COPD:  The patient had no evidence of an acute COPD exacerbation. She was maintained on some p.r.n. DuoNebs. She will resume albuterol nebulizers as stated.   CODE STATUS: The patient is a full code.   DISPOSITION: She is being discharged home with followup with her cardiologist and also her primary care physician in the next 1 to 2 weeks.   TIME SPENT: 40 minutes.    ____________________________ Belia Heman. Verdell Carmine, MD [id number] D: 08/20/2013 15:22:54 ET T: 08/20/2013 20:47:27 ET JOB#: [id number]  cc: Belia Heman. Verdell Carmine, MD, <Dictator> Muhammad A. Fletcher Anon, MD Deborra Medina, MD Henreitta Leber MD ELECTRONICALLY SIGNED 08/25/2013 11:49

## 2014-11-11 NOTE — Consult Note (Signed)
General Aspect Sarah Payne is a 69yo Caucasian female w/ PMHx s/f dementia (mild), CAD (s/p multiple PCIs), DM2 w/ diabetic nephropathy, neuropathy, ongoing tobacco abuse, COPD, hypothyroidism, HTN, HLD, h/o marijuana use who was admitted to [facility] today with chest pain.   She is a patient of Dr. Donivan Scull. Coronary history-  s/p multiple PCIs to [facility], stent dRCA 08/2011 in setting of NSTEMI, 75% prox-mid LCx ISR 09/2011 s/p DES. Repeat cath 07/2012- DES-60% pRCA (FFR 0.77, overlapped w/ mRCA stent), DES-70% mRCA ISR, 70% LAD stenosis distal to D1 (FFR 1.0), jailed RV branch w/ TIMI 1 flow. She has been maintained on ASA, Plavix, Coreg, Imdur, NTG SL PRN. She reports running out of all meds the 2nd week of December, and only being able to refill them last Sunday. Issues with dementia as well per daughter.   Since that time, she reports increased somnolence (had been w/o Synthroid for hypothyroidism). Over the past 2 weeks, she has been experiencing two types of chest discomfort. She notes substernal chest pain radiating between her shoulder blades while laying flat, not aggravated by exertion, alleviated by NTG SL and sitting up/leaning forward and lasting for only a few minutes. C/w her prior anginal pain per pt. Denies associated diaphoresis or nausea. She has also noted increased DOE when walking from one room to another. She continues to smoke < 1/2 PPD. She endorses a cough. Denies fever or chills. She also described upper chest tightness relieved with rubbing the affected area. Denies PND, orthopnea, LE edema, weight increase. + palpitations, - syncope. She had a prolonged episode of interscapular/substernal pain yseterday. She presented to the ED mostly for hypersomnolence/fatigue; however, given her cardiac history and chest pain c/w prior angina, cardiology has been requested to consult.   Present Illness In the ED, EKG revealed unchanged lateral TWIs from prior tracings. Initial TnI WNL. U/a- 1+ LEs,  positive nitrites. CXR- no acute process. TSH 44.2. Hgb A1C 7.3%. CBC WNL. BNP- Na 133.  PAST MEDICAL HISTORY CAD s/p multiple PCIs Uncontrolled DM2 w/ nephrologic/vascular sequelae Hypothyroidism HTN HLD Ongoing tobacco abuse H/o marijuana use Chronic pain  PAST SURGICAL HISTORY Multiple prior cardiac catheterizations/PCIs TAH Appendectomy Back surgery  SOCIAL HISTORY Smokes < 1/2 PPD. Denies EtOH abuse. H/o marijuana use, denies recent illicit drug use.   FAMILY HISTORY Heart disease- mother, first MI 38, passed in 17s Heart disease- father, passeda at age 37 Throat CA- brother  ALLERGIES No known drug allergies   Physical Exam:  GEN no acute distress   HEENT pink conjunctivae, PERRL, hearing intact to voice   NECK supple  No masses  trachea midline  no JVD or bruits   RESP normal resp effort  clear BS  no use of accessory muscles  no appreciable wheezes, rales or rhonchi   CARD Regular rate and rhythm  Normal, S1, S2  No murmur   ABD denies tenderness  soft  normal BS   EXTR negative cyanosis/clubbing, negative edema   SKIN normal to palpation   NEURO follows commands, motor/sensory function intact   PSYCH alert, A+O to time, place, person   Review of Systems:  Subjective/Chief Complaint chest pain, hypersomnolence   General: Fatigue   Respiratory: Frequent cough  Short of breath   Cardiovascular: Chest pain or discomfort  Tightness  Palpitations  Dyspnea   Review of Systems: All other systems were reviewed and found to be negative   Home Medications: Medication Instructions Status  glipiZIDE 10 mg oral tablet 1 tab(s) orally  2 times a day (before meals) Active  aspirin 81 mg oral tablet 1 tab(s) orally once a day Active  Cymbalta 60 mg oral delayed release capsule 1 cap(s) orally once a day Active  levothyroxine 137 mcg (0.137 mg) oral tablet 1 tab(s) orally once a day Active  Nitrostat 0.4 mg sublingual tablet 1 tab(s) sublingual every 5  minutes, As Needed Active  Norco 10 mg-325 mg oral tablet 1 tab(s) orally every 6 hours, As Needed Active  Vitamin B-12 1000 mcg/mL injectable solution 1 milliliter(s) injectable once Active  albuterol 2.5 mg/3 mL (0.083%) inhalation solution 3 milliliter(s) inhaled every 6 hours, As Needed - for Wheezing Active  clobetasol topical 0.05% topical ointment Apply topically to affected area once a day Active  donepezil 5 mg oral tablet 1 tab(s) orally once a day (at bedtime) Active  Spiriva 18 mcg inhalation capsule 1 cap(s) inhaled once a day Active  Zofran ODT 8 mg oral tablet, disintegrating 1 tab(s) orally every 8 hours, As Needed Active  metFORMIN 500 mg oral tablet 2 tab(s) orally 2 times a day Active  gabapentin 100 mg oral capsule 1 cap(s) orally 3 times a day Active  Flovent HFA CFC free 220 mcg/inh inhalation aerosol 1 puff(s) inhaled 2 times a day Active  etodolac 400 mg oral tablet 1 tab(s) orally 2 times a day Active  clopidogrel 75 mg oral tablet 1 tab(s) orally once a day Active  trazodone 100 mg oral tablet 1 tab(s) orally once a day (at bedtime) Active  Vasotec 10 mg oral tablet 1 tab(s) orally once a day Active  Coreg 6.25 mg oral tablet 1 tab(s) orally 2 times a day Active  Mylanta  orally ,no dose specified Active  magnesium oxide 400 mg (241.3 mg elemental magnesium) oral tablet 1 tab(s) orally once a day Active  Januvia 100 mg oral tablet 1 tab(s) orally once a day Active  Imdur 30 mg oral tablet, extended release 1 tab(s) orally once a day (in the morning) Active   Lab Results:  Thyroid:  30-Jan-15 12:19   Thyroid Stimulating Hormone  44.2 (0.45-4.50 (International Unit)  ----------------------- Pregnant patients have  different reference  ranges for TSH:  - - - - - - - - - -  Pregnant, first trimetser:  0.36 - 2.50 uIU/mL)  Routine Chem:  30-Jan-15 12:19   Hemoglobin A1c ([facility])  7.3 (The American Diabetes Association recommends that a primary goal of therapy  should be <7% and that physicians should reevaluate the treatment regimen in patients with HbA1c values consistently >8%.)  Glucose, Serum  180  BUN  20  Creatinine (comp) 1.22  Sodium, Serum  133  Potassium, Serum 4.1  Chloride, Serum 100  CO2, Serum 27  Calcium (Total), Serum 9.0  Anion Gap  6  Osmolality (calc) 274  eGFR (African American)  54  eGFR (Non-African American)  46 (eGFR values <78m/min/1.73 m2 may be an indication of chronic kidney disease (CKD). Calculated eGFR is useful in patients with stable renal function. The eGFR calculation will not be reliable in acutely ill patients when serum creatinine is changing rapidly. It is not useful in  patients on dialysis. The eGFR calculation may not be applicable to patients at the low and high extremes of body sizes, pregnant women, and vegetarians.)  Cardiac:  30-Jan-15 12:19   Troponin I < 0.02 (0.00-0.05 0.05 ng/mL or less: NEGATIVE  Repeat testing in 3-6 hrs  if clinically indicated. >0.05 ng/mL: POTENTIAL  MYOCARDIAL INJURY. Repeat  testing in 3-6 hrs if  clinically indicated. NOTE: An increase or decrease  of 30% or more on serial  testing suggests a  clinically important change)  Routine UA:  30-Jan-15 12:19   Color (UA) Yellow  Clarity (UA) Clear  Glucose (UA) Negative  Bilirubin (UA) Negative  Ketones (UA) Negative  Specific Gravity (UA) 1.008  Blood (UA) Negative  pH (UA) 6.0  Protein (UA) Negative  Nitrite (UA) Positive  Leukocyte Esterase (UA) 1+ (Result(s) reported on 19 Aug 2013 at 12:55PM.)  RBC (UA) 1 /HPF  WBC (UA) 5 /HPF  Bacteria (UA) TRACE  Epithelial Cells (UA) 2 /HPF  Hyaline Cast (UA) 1 /LPF (Result(s) reported on 19 Aug 2013 at 12:55PM.)  Routine Hem:  30-Jan-15 12:19   WBC (CBC) 10.3  RBC (CBC) 4.66  Hemoglobin (CBC) 14.8  Hematocrit (CBC) 43.8  Platelet Count (CBC) 271  MCV 94  MCH 31.8  MCHC 33.8  RDW 14.3  Neutrophil % 67.9  Lymphocyte % 24.0  Monocyte % 6.8   Eosinophil % 0.8  Basophil % 0.5  Neutrophil #  7.0  Lymphocyte # 2.5  Monocyte # 0.7  Eosinophil # 0.1  Basophil # 0.1 (Result(s) reported on 19 Aug 2013 at 12:46PM.)   EKG:  Interpretation NSR, TWIs I, aVL, no ST changes otherwise   Rate 74   EKG Comparision Not changed from  03/2013 office tracing   Radiology Results: XRay:    30-Jan-15 13:49, Chest PA and Lateral  Chest PA and Lateral   REASON FOR EXAM:    dyspnea  COMMENTS:       PROCEDURE: DXR - DXR CHEST PA (OR AP) AND LATERAL  - Aug 19 2013  1:49PM     CLINICAL DATA:  Shortness of breath    EXAM:  CHEST  2 VIEW    COMPARISON:  02/12/2011    FINDINGS:  Cardiac shadow is stable. The lungs are clear bilaterally. No focal  infiltrate or sizable effusion is noted. Diffuse coronary stenting  is noted. No acute bony abnormality is seen.     IMPRESSION:  No acute abnormality noted.      Electronically Signed    By: Inez Catalina M.D.    On: 08/19/2013 13:54         Verified By: Everlene Farrier, M.D.,    No Known Allergies:    Impression 69yo Caucasian female w/ PMHx s/f dementia (mild), CAD (s/p multiple PCIs), DM2 w/ diabetic nephropathy, neuropathy, ongoing tobacco abuse, COPD, hypothyroidism, HTN, HLD, h/o marijuana use who was admitted to [facility] today with chest pain.   1. Chest pain The patient has an extensive ischemic cardiac history requiring multiple PCIs, most recently 07/2012. Stable w/o anginal symptoms on 03/2013 follow-up w/ Dr. Rockey Situ. Since that time, she has been without her medication for the past 1.5 months, only restarted them this past weekend. Miscommunication w/ PCP. Daughter notes a concern w/ taking meds appropriately d/t dementia as well. She continues to smoke cigarettes. She has been without several of her antianginals during this time. She has had to use NTG SL x 2 and had an episode of chest pain lasting "all day" yesterday, per daughter. Also DOE. Despite this, initial TnI WNL. EKG w/o  new ischemic findings. No STEs or PR depressions. Hx limited by memory deficitis. Description also overlaps w/ chronic pain complaints. She states the discomfort is similar to prior anginal episodes. Recent cough. DDx includes angina, pericarditis, pleuritis, chest wall pain, chronic pain. We  know she has stable angina at baseline. Unclear if this has been more unstable d/t disease progression/ISR or lack of antianginals.  -- Restart cardiac meds- ASA, Plavix, carvedilol, Imdur, NTG SL PRN. Could consider increasing Imdur +/- Ranexa or amlodipine (BP permitting). STOP SMOKING (vasoconstriction, esp w/ RCA dz, jailed RV). -- Would benefit from high-potency statin if able to tolerate -- Cycle cardiac biomarkers for r/o -- Short trial of NSAIDs for inflammatory etiology (recent cough, ? viral) -- Outpatient ischemic evaluation if rules out  2. Hypothyroidism, uncontrolled TSH elevated, off Synthroid for several weeks. Explains hypersomnolence. Contributing to DOE? -- Restart Synthroid +/- titrate per primary team   Plan 3. UTI Evidenced on u/a.  -- Cover empirically per primary team  4. Dementia Seemingly impeding her ability to take her medications reliably, manage refills, etc.  -- Home health referral has been made by PCP  5. Ongoing tobacco abuse Needs to stop smoking.  -- Offer nicotine replacement therapy for cessation assistance  6. DM2 w/ diabetic nephropathy, neuropathy Hgb A1C 7.3% -- CKD too far advanced for ACEi benefit? Need room for antianginals also -- Glycemic control per primary team   Electronic Signatures for Addendum Section:  Kathlyn Sacramento (MD) (Signed Addendum 30-Jan-15 17:31)  The patient was seen and examined. Agree with the above. She has known history of CAD with multiple PCIs and multiple chronic medical conditions. She ran out of her medications since December. She presented to the ED due to fatigue and sleeping for 3 days. She has chronic chest pain. No  murmurs by exam.  Negative TnI. TSH is very elevated.  I suspect that most of her symptoms are due to untreated hypothyroidism. I recommend resuming home medications. No further cardiac work up is recommended. An outpatient stress test can be considered if she continues to have chest pain with medical therapy.   Electronic Signatures: Meriel Pica (PA-C)  (Signed 30-Jan-15 16:10)  Authored: General Aspect/Present Illness, History and Physical Exam, Review of System, Home Medications, Labs, EKG , Radiology, Allergies, Impression/Plan Kathlyn Sacramento (MD)  (Signed 30-Jan-15 17:31)  Co-Signer: General Aspect/Present Illness, History and Physical Exam, Review of System, Home Medications, Labs, EKG , Radiology, Allergies, Impression/Plan   Last Updated: 30-Jan-15 17:31 by Kathlyn Sacramento (MD)

## 2014-11-12 NOTE — H&P (Signed)
PATIENT NAME:  Sarah Payne, Sarah Payne MR#:  [id number] DATE OF BIRTH:  11/12/1945  DATE OF ADMISSION:  09/15/2011  PRIMARY CARE PHYSICIAN:  Dr. Derrel Nip  HISTORY OF PRESENT ILLNESS:  The patient is a 69 year old Caucasian female with past medical history significant for history of non-Q-wave myocardial infarction, status post cardiac catheterization 01/2011 which revealed a left circumflex occlusion, status post three drug-eluting stent placements by Dr. Rockey Situ.  She presented back to the hospital with complaints of chest pains. According to the patient she was doing well up until 3:00 p.m. on the day of admission when she started having midsternal chest pain while she was lying in bed. Pain was sharp, constant, with no alleviating or aggravating factors, radiating to the right shoulder as well as the right arm, 10/10 in intensity initially and now down to 4 to 5/10 in intensity. She admits lightheadedness as well as nausea but no vomiting or feeling sweaty as well as short of breath. She had very similar pains in the past; in fact she was admitted for non-Q-wave myocardial infarction in 07/12, and she states that her pain is very similar to the same she had in 01/2011. She denies any other symptoms other than as mentioned above. She had cardiac catheterization done in July 2012 which revealed, as mentioned above, left circumflex occlusion, status post three drug-eluting stent placements by Dr. Rockey Situ at the same time. She had a stress test done at the end of 01/2011, which showed no inducible ischemia. Ejection fraction was found to be normal at 56%.  PAST MEDICAL HISTORY:  1. Diabetes mellitus.  2. Hypertension.  3. Hyperlipidemia.  4. Hypothyroidism.  5. Depression. 6. Non-Q-wave myocardial infarction in 01/2011. Normal recent stress test in 01/2011 with ejection fraction of 56%, normal. 7. Status post cardiac catheterization by Dr. Rockey Situ with three drug-eluting stents placed in the left circumflex as  mentioned above.  8. History of vascular dementia. 9. Gastroesophageal reflux disease.  10. Chronic back pains, on opiates.  11. History of diabetic gastropathy. 12. Smoking ongoing. 13. History of marijuana use in the past.   PAST SURGICAL HISTORY: 1. Back surgery.  2. Hysterectomy.  3. Appendectomy.   MEDICATIONS: According to the medical records the patient was on: 1. Levothyroxine 125 mg p.o. daily.  2. Aspirin 81 mg p.o. daily.  3. Nicotine patch daily, however she is not using anymore. 4. Reglan 10 mg 3 times daily as needed.  5. Metformin 1 gram twice daily.  6. Hydroxyzine 25 mg q. 6 hours as needed.   7. Trazodone 100 mg p.o. daily.  8. Lipitor 80 mg p.o. daily.  9. Glipizide 2.5 mg p.o. daily.  10. B12 shot monthly.  11. Cymbalta 30 mg p.o. daily.  12. Vasotec 10 mg p.o. daily.  13. Coreg 3.125 mg p.o. daily.  14. Plavix 75 mg p.o. daily.  15. Nitroglycerin as needed.   FAMILY HISTORY: The patient's mother had hypertension, cerebrovascular accident, and myocardial infarction. The patient's father is healthy. The patient's brother died of throat cancer.   SOCIAL HISTORY: The patient has extensive history of tobacco abuse, 1 pack per day for the past 30 years, quit for a short period of time and now she is back smoking. Apparently she was using some nicotine replacement therapy at home. However, the patch caused her to have a rash. No alcohol abuse. Used to smoke marijuana but now quit. She is married, lives with her husband.  REVIEW OF SYSTEMS:  Positive for fatigue and weakness,  pains in her chest, some shortness of breath and nausea earlier today, and diarrhea earlier today as well as dry nasal cavity. Otherwise denies fevers, chills, weight loss or gain. EYES: In regards to eyes, denies any blurry vision, double vision, glaucoma, or cataracts. ENT: Denies any tinnitus, allergies, epistaxis, sinus pain, dentures, or difficulty swallowing. RESPIRATORY: Denies any cough,  wheeze, asthma, or chronic obstructive pulmonary disease. CARDIOVASCULAR: Denies orthopnea, edema, arrhythmias, or palpitations. However, the patient admits that initially when she first had chest pain she had some palpitations. Denies any syncope. GASTROINTESTINAL: Denies any diarrhea now or vomiting. No hematemesis. No rectal bleeding or change in bowel habits. GENITOURINARY: Denies dysuria, hematuria, frequency, or incontinence. ENDOCRINE: Denies any polydipsia, nocturia, thyroid problems, heat or cold intolerance, or thirst. HEMATOLOGIC: Denies anemia, easy bruising, bleeding, or swollen glands. SKIN: Denies any acne, rashes, lesions, or change in moles. MUSCULOSKELETAL: Denies arthritis, cramps, swelling, or gout. NEUROLOGIC:  Denies numbness, epilepsy, or tremor. PSYCHIATRIC: Denies anxiety, insomnia, or depression.   PHYSICAL EXAMINATION:  VITAL SIGNS: On arrival to the hospital, temperature 95.6, pulse 68, respiration rate 24, blood pressure 137/82, saturation 99% on oxygen therapy.   GENERAL: This is a well-developed, well-nourished Caucasian female in no significant distress lying on the stretcher.  HEENT:  Pupils equal and reactive to light.  Extraocular movements intact.  No icterus or conjunctivitis. Has normal hearing. No pharyngeal erythema. Mucosa is moist.   NECK: No masses, supple, nontender. Thyroid is not enlarged. No adenopathy. No JVD or carotid bruits bilaterally. Full range of motion.   LUNGS: A few rhonchi were heard as well as some rales. However, seem to improve when she takes a deep breath. No diminished breath sounds or wheezing. No labored inspiration, increased effort, dullness to percussion, or overt respiratory distress.   CARDIOVASCULAR: S1, S2 appreciated.  No murmurs, rubs, or gallops noted. PMI not lateralized. Chest is tender to palpation.  However, the patient denies that this tenderness is what  brought her to the Emergency Room.  No lower extremity edema. Normal  pedal pulses. No calf tenderness or cyanosis was noted.  Abdomen was otherwise soft, nontender. Bowel sounds are present. No hepatosplenomegaly or masses were noted.   RECTAL: Deferred.   MUSCULOSKELETAL: Able to move all extremities. No cyanosis, degenerative joint disease, or kyphosis. Gait is not tested.   SKIN: No rashes, lesions, erythema, nodularity, or induration.  It warm and dry to palpation.   LYMPH: No adenopathy in the cervical region.   NEUROLOGIC: Cranial nerves grossly intact. Sensory is intact. No dysarthria or aphasia.   PSYCH: The patient is alert, oriented to time, person, and place, cooperative. Memory is good. No significant confusion, agitation, or depression noted.   LABORATORY/ DIAGNOSTIC DATA:  09/15/2011 glucose 152. Beta-type natriuretic peptide of  225, otherwise unremarkable study. The patient's liver enzymes are not done. Cardiac enzymes, troponin 0.05. White blood cell count is normal at 10.1, hemoglobin 12.1, platelets 263. EKG showed normal sinus rhythm at 65 beats per minute, first degree A-V block, nonspecific T-wave abnormality. No acute ST-T changes were noted. The patient's EKG is compared to prior EKG done on the 25th of July 2012. At that time the patient had ST depressions in V3 as well as V4 and V5. However those ST depressions are gone now. No acute ST-T changes were noted otherwise.   RADIOLOGIC STUDIES: Chest x-ray, portable single view:  No acute cardiopulmonary disease.   ASSESSMENT AND PLAN:  1. Chest pain:  Admit the patient to  observation and telemetry. We will start the patient on Coreg, nitroglycerin topically as well as continue her on aspirin, Plavix, and heparin subcutaneous. We will follow her cardiac enzymes times three.  We will get  cardiologist Dr. Rockey Situ to see the patient. 2. Hypertension:  Seems to be well controlled. Add nitroglycerin topically for now.  3. Hyperlipidemia: Continue Lipitor. 4. Diabetes mellitus: Continue glipizide  as well as sliding scale insulin.  5. Ongoing tobacco abuse: Nicotine replacement.  This was discussed with the patient for four minutes and we will be giving the patient nicotine replacement therapy upon discharge.   TIME SPENT:  50 minutes. ____________________________ Theodoro Grist, MD rv:bjt D:  09/15/2011 21:33:30 ET         T: 09/16/2011 06:42:04 ET        JOB#: [id number]  cc: Deborra Medina, MD [place] MD ELECTRONICALLY SIGNED 09/17/2011 14:32

## 2014-11-12 NOTE — Consult Note (Signed)
General Aspect 69 year old female with past medical history of diabetes, hypertension, hypothyroidism, depression,  smoking since age 30 yo, chronic low back pain, CAD with three vessel disease with occluded mid LCX, s/p PCI with DES stent x 3 placed, now presenting with chest pain. Cardiology was consulted for chest pain and elevated cardiac enz.  She reports chest pain started yesterday at 3pm. Sx were severe, center of her chest, similar to previous epsiodes of chest pain in July prior to PCI.   Pain did not radiate. No lightheadedness or dizziness. She has been taking her medications as prescribed. She is smoking again after she stopped for a few months. Chest pain resolved in the late PM. Currently she is pain-free. Prior to this epsiode, she had been feeling well.    She does have chronic back pain and hx of back surgery with rods.    Present Illness . Cardiac cath: Occluded mid LCX after OM2 (likely culprit for symptoms and NSTEMI). Severely diseased small OM2, moderate to Severe proximal to Mid LAD disease after Diagonal branch. moderate distal LAD disease. Moderate PDA and PLV disease.  SOCIAL HISTORY: The patient has smoked for the past 30 years. She denies any alcohol. She used to smoke marijuana, has now quit. She lives with her husband.   FAMILY HISTORY: Mother had hypertension, CVA, and MI.  Father was healthy.  Brother died of throat cancer.   Physical Exam:   GEN WD, WN, NAD    HEENT red conjunctivae    NECK supple  No masses    RESP normal resp effort  clear BS    CARD Regular rate and rhythm  No murmur    ABD denies tenderness  soft    LYMPH negative axillae    EXTR negative edema    NEURO cranial nerves intact, motor/sensory function intact    PSYCH alert, A+O to time, place, person, good insight   Review of Systems:   Subjective/Chief Complaint Chest pain    Skin: No Complaints    ENT: No Complaints    Eyes: No Complaints    Neck: No Complaints     Respiratory: No Complaints    Cardiovascular: Chest pain or discomfort    Gastrointestinal: No Complaints    Genitourinary: No Complaints    Vascular: No Complaints    Musculoskeletal: No Complaints    Neurologic: No Complaints    Hematologic: No Complaints    Endocrine: No Complaints    Psychiatric: No Complaints    Review of Systems: All other systems were reviewed and found to be negative    Medications/Allergies Reviewed Medications/Allergies reviewed     Anxiety:    Diabetes:    GERD:    Back Pain:    HTN:    Hypothyroid:    cardiac stent placed friday x three.:    mi:    Back Surgery:    Hysterectomy:        Admit Diagnosis:   CHEST PAIN: 16-Sep-2011, Active, CHEST PAIN      Admit Reason:   Chest pain: (786.50) Active, ICD9, Unspecified chest pain  Home Medications: Medication Instructions Status  metformin 1000 mg oral tablet 1 tab(s) orally 2 times a day Active  levothyroxine 125 mcg (0.125 mg) oral tablet 1 tab(s) orally once a day in the morning .Marland Kitchen30 mins prior to food and other medications Active  Cymbalta 30 mg oral delayed release capsule 1 cap(s) orally once a day Active  clopidogrel 75 mg oral tablet 1  tab(s) orally once a day Active  atorvastatin 80 mg oral tablet 1 tab(s) orally once a day (at bedtime) Active  glipiZIDE 2.5 mg oral tablet, extended release 1 tab(s) orally 30 minutes before meals. **do not crush** Active  trazodone 100 mg oral tablet 1 tab(s) orally once a day (at bedtime) Active  Bayer Aspirin Regimen 325 mg oral delayed release tablet 1 tab(s) orally once a day Active  nicotine 14 mg/24 hr transdermal film, extended release 1 patch transdermal once a day Active  magnesium oxide 400 mg oral tablet 1 tab(s) orally 2 times a day with food Active  Mylanta 1  Tablespoon for chest pain before resorting to nitroglycerin. Active  Nitrostat 0.4 mg sublingual tablet 0.5 tab(s) sublingual every 5 minutes, As Needed for  chest pain not relieved with mylanta Active  Vitamin B-12 1000 mcg/mL injectable solution Inject 1 milliliter(s) subcutaneous once a month. **pt last shot was on 08/2011** Active     Routine Hem:  26-Feb-13 04:23    WBC (CBC) 8.0   RBC (CBC) 3.71   Hemoglobin (CBC) 11.8   Hematocrit (CBC) 34.8   Platelet Count (CBC) 241   MCV 94   MCH 31.8   MCHC 33.9   RDW 13.7  Routine Chem:  26-Feb-13 04:23    Glucose, Serum 122   BUN 15   Creatinine (comp) 0.96   Sodium, Serum 142   Potassium, Serum 3.8   Chloride, Serum 106   CO2, Serum 26   Calcium (Total), Serum 8.5   Anion Gap 10   Osmolality (calc) 285   eGFR (African American) >60   eGFR (Non-African American) >60  Cardiac:  26-Feb-13 04:23    Troponin I 1.13   CPK-MB, Serum 6.3  Routine Hem:  26-Feb-13 04:23    Neutrophil % 53.8   Lymphocyte % 37.9   Monocyte % 6.5   Eosinophil % 1.5   Basophil % 0.3   Neutrophil # 4.3   Lymphocyte # 3.0   Monocyte # 0.5   Eosinophil # 0.1   Basophil # 0.0  Hepatic:  26-Feb-13 04:23    Bilirubin, Total 0.2   Alkaline Phosphatase 56   SGPT (ALT) 19   SGOT (AST) 25   Total Protein, Serum 6.6   Albumin, Serum 3.6  Cardiac:  26-Feb-13 04:23    CK, Total 183   EKG:   Interpretation EKG shows NSR with nonspecific T wave abn    Rate 53   Radiology Results: XRay:    25-Feb-13 17:12, Chest Portable Single View   Chest Portable Single View    REASON FOR EXAM:    Chest Pain  COMMENTS:       PROCEDURE: DXR - DXR PORTABLE CHEST SINGLE VIEW  - Sep 15 2011  5:12PM     RESULT: Comparison is made to the study dated 12 February 2011.    The patient is rotated toward the left. Cardiac monitoring electrodes are   present. The lungs are clear. The heart and pulmonary vessels are normal.   The bony and mediastinal structures are unremarkable. There is no   effusion. There is no pneumothorax or evidence of congestive failure.    IMPRESSION:  No acute cardiopulmonary  disease.        Verified By: Sundra Aland, M.D., MD    No Known Allergies:   Vital Signs/Nurse's Notes: **Vital Signs.:   26-Feb-13 03:47   Vital Signs Type Q 4hr   Temperature Temperature (F) 97.9  Celsius 36.6   Temperature Source oral   Pulse Pulse 58   Pulse source per Dinamap   Respirations Respirations 20   Systolic BP Systolic BP 072   Diastolic BP (mmHg) Diastolic BP (mmHg) 68   Mean BP 82   Pulse Ox % Pulse Ox % 96   Pulse Ox Activity Level  At rest   Oxygen Delivery Room Air/ 21 %     Impression 69 year old female with past medical history of diabetes, hypertension, hypothyroidism, depression,  smoking since age 3 yo, chronic low back, three vessel CAD with occluded mid LCX, s/p PCI with DES stent x 3 placed to LCX, now presenting with chest pain.   Symptoms concerning for unstable angina,  cardiac enz climbing consistent with NSTEMI Currently on heparin, b-blocker, aspririn, plavix, ACE, statin Nonspecific EKG changes noted. Currentlt with mild discomforft, much improved on medical management.   A/P: 1) Chest pain NSTEMI, cardiac cath planned for today Would continue heparin ggt, b-blocker, (hold ACE) continue plavix/asa.  2)Smoking  nicotine patch, counseling  3) CAD, h/o PCI three vessel disease by previous cardiac cath in 2012 Cath planned today  4) CHronic back pain on pain meds, s/p surgery   Electronic Signatures: Ida Rogue (MD)  (Signed 26-Feb-13 09:17)  Authored: General Aspect/Present Illness, History and Physical Exam, Review of System, Past Medical History, Health Issues, Home Medications, Labs, EKG , Radiology, Allergies, Vital Signs/Nurse's Notes, Impression/Plan   Last Updated: 26-Feb-13 09:17 by Ida Rogue (MD)

## 2014-11-12 NOTE — Discharge Summary (Signed)
PATIENT NAME:  Sarah Payne, Sarah Payne MR#:  [id number] DATE OF BIRTH:  Jul 17, 1946  DATE OF ADMISSION:  09/16/2011 DATE OF DISCHARGE:  09/18/2011  ADMITTING PHYSICIAN: Dr. Theodoro Grist. DISCHARGING PHYSICIAN: Dr. Deanne Coffer.  PRIMARY CARE PHYSICIAN:  Dr. Derrel Nip.   CONSULTANTS:  1. Dr. Rockey Situ. 2. Dr. Clayborn Bigness.   ADMITTING DIAGNOSIS: Chest pain.   DISCHARGE DIAGNOSES:  1. Chest pain secondary to non-ST elevated myocardial infarction.  2. Coronary artery disease status post prior stent status post cardiac catheterization requiring percutaneous coronary intervention of the right coronary artery.  3. Hyperlipidemia. 4. Hypertension. 5. Diabetes. 6. Ongoing tobacco abuse.   TESTS DONE DURING THIS HOSPITALIZATION:  1. Chest x-ray on 09/15/2011 showed no acute cardiopulmonary disease.  2. Cardiac catheterization was done by Dr. Clayborn Bigness on 09/17/2011 showed percutaneous coronary intervention stent RCA. Mid circumflex in-stent restenosis was not stented, but will be re-evaluated in two weeks   HOSPITAL COURSE: Initial history and physical were done by Dr. Ether Griffins. Please refer to her note dated 09/15/2011 for complete details. In brief, this is a 69 year old white female with past medical history of non-ST myocardial infarction status post cardiac catheterization 01/2011 which revealed left circumflex occlusion, status post three drug-eluting stent placements. She has presented back to the hospital with complaints of chest pain. She had very similar complaints when she had her last myocardial infarction. She had elevated troponins and was admitted to the hospitalist service.  1. Non-ST elevated myocardial infarction. The patient was seen by Dr. Rockey Situ. She had diagnostic cardiac catheterization done on 09/17/2011 which showed 95% RCA stenosis and LAD lesion proximal. She was on heparin drip, aspirin, Plavix, Coreg, enalapril and statin. She had the above-noted cardiac catheterization done by Dr. Clayborn Bigness  on 02/27 which only stented the RCA. He will re-evaluate the patient's stents again in two weeks.  2. Coronary artery disease status post prior stents. The patient was continued on aspirin, Plavix, Coreg and heparin drip.  3. Hypertension. The patient was continued on Coreg and enalapril and was stable.  4. Hyperlipidemia. The patient was given Lipitor.  5. She has hypertriglyceridemia noted on labs. She had TriCor added.  6. Diabetes. The patient was on Glipizide, sliding scale insulin. Metformin was held due to her cardiac catheterization.  7. Nicotine abuse. The patient was put on nicotine patch. She was counseled about cessation at length.  8. Deep vein thrombosis prophylaxis. The patient was on heparin drip.   DISCHARGE PHYSICAL EXAMINATION:   The patient was discharged on 09/18/2011. The patient's temperature was 98.5, heart rate 70, blood pressure 121/78, respiratory rate 14, sating 96% on room air. LUNGS: Clear to auscultation. CARDIOVASCULAR: Regular rate and rhythm. ABDOMEN: Benign.   DISCHARGE MEDICATIONS:  1. Metformin 1000 mg 1 tablet twice a day.  2. Levothyroxine 1 tablet daily in the morning 30 minutes prior to food. 3. Cymbalta 30 mg once a day. 4. Plavix 75 mg daily.  5. Atorvastatin 80 mg daily.  6. Glipizide 2.5 mg extended-release 1 tablet daily.  7. Trazodone 100 mg at bedtime.  8. Nitrostat 0.4 mg 1/2 tablet sublingual every five minutes p.r.n. chest pain. 9. Vitamin B12 1000 mcg injected monthly.  10. Aspirin 81 mg daily.  11. Reglan 10 mg t.i.d.  12. Coreg 3.125 mg p.o. b.i.d.  13. Vasotec 10 mg daily.   DIET: Low sodium ADA, low fat diet.   ACTIVITY: No exertional activity.  FOLLOW-UP:  1. Followup with Dr. Rockey Situ in one week. 2. Followup with Dr. Clayborn Bigness for  cardiac catheterization in two weeks.  3. See Dr. Derrel Nip next week as well.   CODE STATUS: FULL CODE.   Thank you for allowing me to participate in the care of this patient.   TOTAL TIME SPENT ON  DISCHARGE: 45 minutes.    ____________________________ Judeth Horn Royden Purl, MD aaf:ap D: 09/22/2011 13:46:52 ET T: 09/23/2011 11:04:09 ET JOB#: [id number]  cc: Mike Craze A. Royden Purl, MD, <Dictator> Minna Merritts, MD Deborra Medina, MD Dwayne D. Clayborn Bigness, MD Joaquin Bend MD ELECTRONICALLY SIGNED 09/23/2011 14:06

## 2014-11-12 NOTE — H&P (Signed)
PATIENT NAME:  Sarah Payne, Sarah Payne MR#:  [id number] DATE OF BIRTH:  August 10, 1945  DATE OF ADMISSION:  10/02/2011  PRIMARY CARE PHYSICIAN: Deborra Medina, MD   CARDIOLOGIST: Ida Rogue, MD     INDICATIONS: 75% in-stent proximal circumflex with angina and chest pain.   HISTORY OF PRESENT ILLNESS: Sarah Payne is a 69 year old white female with a history of non-Q-wave myocardial infarction on 09/15/2011.  She ended up having an angioplasty and stenting of the proximal circumflex with three overlapping drug-eluting stents. She did reasonably well. She presented with chest pain about a month later and was found to have worsening disease of the circumflex but a new lesion in the right coronary artery. She had angioplasty and stent in the right coronary artery and did reasonably well. She now presents today for staged intervention of the circumflex of the in-stent lesion, which is 75%. The patient was placed on anticoagulation by Dr. Rockey Situ and should be on Plavix. There is some question about whether she continued to take her regular dose today, and she may have gotten a bolus loading dose of Effient, but today got 300 of Plavix preprocedure and now is prepared for angioplasty and stenting.   PAST MEDICAL HISTORY:  1. Diabetes. 2. Hypertension. 3. Hyperlipidemia. 4. Hyperthyroidism. 5. Depression. 6. Non-Q-wave myocardial infarction.  7. Cardiac catheterization.  8. Vascular dementia.  9. Gastroesophageal reflux.  10. Chronic back pains.  11. Diabetic gastropathy.     PAST SURGICAL HISTORY:  1. Angioplasty and stenting of circumflex on 01/25/2011. Non-Q-wave myocardial infarction for which she had angioplasty and stenting of the proximal circumflex with drug-eluting stent at that time.  2. Subsequent angioplasty and stenting the end of February 2013 to the right coronary artery.  3. Back surgery. 4. Hysterectomy. 5. Appendectomy.   MEDICATIONS:  1. Levothyroxine 225 mcg a day.  2. Aspirin 81 mg a  day.  3. NicoDerm patch 21 mg a day.  4. Reglan 10 mg t.i.d. as needed.  5. Metformin 1 gram b.i.d.   6. Hydroxyzine 25 every 6 hours p.r.n.  7. Trazodone 100 mg a day.  8. Lipitor 10 mg a day.  9. Glipizide 2.5 mg a day.  10. B12 shots monthly.  11. Cymbalta 30 mg a day.  12. Vasotec 10 mg a day.  13. Coreg 3.125 mg b.i.d.  14. Plavix 75 mg a day.  15. Nitroglycerin p.r.n.   FAMILY HISTORY: Cerebrovascular accident, myocardial infarction, throat cancer.   SOCIAL HISTORY: Smoking. Denies alcohol consumption. She is married, lives with her husband.   PHYSICAL EXAMINATION:  VITAL SIGNS: Blood pressure was 140/80, pulse 65, respiratory rate 18, afebrile.   HEENT: Normocephalic, atraumatic. Pupils are reactive to light.   NECK: Exam was supple.  No jugular venous distention, bruit or adenopathy.   LUNGS: Exam clear to auscultation, no wheezing, rhonchi, or rales.  HEART: Regular rate and rhythm.   ABDOMEN: Exam is benign.   EXTREMITIES: Exam within normal limits.   NEUROLOGIC: Exam is intact.   SKIN: Exam is normal.   LABORATORY AND DIAGNOSTIC DATA: Pending.   ASSESSMENT:  1. Unstable angina. 2. Coronary artery disease. 3. In-stent restenosis.  4. Hyperlipidemia.  5. Hypertension.  6. History of smoking.  7. Diabetes.   PLAN: Proceed with angioplasty and stenting of the proximal circumflex in-stent 75%. Hopefully we will place a drug-eluting stent inside the stent to treat.   ____________________________ Loran Senters. Clayborn Bigness, MD ddc:cbb D: 10/02/2011 14:28:32 ET T: 10/02/2011 15:08:58 ET JOB#: [id number]  cc: Dalissa Lovin D. Clayborn Bigness, MD, <Dictator> Yolonda Kida MD ELECTRONICALLY SIGNED 10/08/2011 8:29

## 2014-11-19 NOTE — Consult Note (Signed)
General Aspect Primary Cardiologist: Dr. Rockey Situ, MD _________________  69 year old female with history of CAD (s/p multiple PCIs), DM2 w/ diabetic nephropathy, ongoing tobacco abuse, COPD, mild dementia, hypothyroidism, HTN, HLD, h/o marijuana use who was sent to [facility] from her PCP's office today 2/2 back pain and abnormal EKG with new lateral TWI.  ________________  PMH: 1. CAD s/p multiple PCIs as below 2. DM2 3. Diabetic neuropathy 4. Ongoing tobacco abuse 5. COPD 6. Mild demtia 7. Hypothyroidism 8. HTN 9. HLD 10. H/o MJ abuse _________________   Present Illness She has known CAD s/p multiple PCIs to [facility], stent dRCA 08/2011 in setting of NSTEMI, 75% prox-mid LCx ISR 09/2011 s/p DES. Repeat cath 07/2012- DES-60% pRCA (FFR 0.77, overlapped w/ mRCA stent), DES-70% mRCA ISR, 70% LAD stenosis distal to D1 (FFR 1.0), jailed RV branch w/ TIMI 1 flow. She has been maintained on ASA, Plavix, Coreg, Imdur, NTG SL PRN. She reports running out of all meds multiple times in the past, once in December 2014, a second time January 2015.   She was recently diagnosed with C diff infection in November 2015 and has recovered from this for the most part except for her energy. She continues to be fatigued. She was C diff negative on 07/10/2014. Stool is normal and appetite is normal.   She was seen by her PCP's NP today for a 1-2 week history of non-exertional mib-back pain that is tender to palpation. She denies any chest pain. Pain is similar to pain during prior admissions. She reports taking all of her medications daily and that she has not ran out of any. She denies any SOB, nausea, vomiting, or diaphoresis. At her PCP's office an EKG was taken that showed new TWI along the lateral leads prompting her to be sent to [facility] for further evaluation. EKG here shows improvement of this TWI. Troponin is negative x 1 so far. CXR without acute abnormality. She is currently without any back/chest pain.   Physical  Exam:  GEN well developed, no acute distress   HEENT hearing intact to voice   NECK supple   RESP normal resp effort  wheezing   CARD Regular rate and rhythm  No murmur   ABD denies tenderness  soft   EXTR negative edema   SKIN patient is TTP along the left thoracic paraspinal muscles   NEURO cranial nerves intact   PSYCH alert   Review of Systems:  Subjective/Chief Complaint back pain   General: No Complaints   Skin: No Complaints   ENT: No Complaints   Eyes: No Complaints   Neck: No Complaints   Respiratory: No Complaints   Cardiovascular: No Complaints   Gastrointestinal: No Complaints   Genitourinary: No Complaints   Vascular: No Complaints   Musculoskeletal: positive for back pain   Neurologic: No Complaints   Hematologic: No Complaints   Endocrine: No Complaints   Psychiatric: No Complaints   Review of Systems: All other systems were reviewed and found to be negative   Medications/Allergies Reviewed Medications/Allergies reviewed   Family & Social History:  Family and Social History:  Family History Coronary Artery Disease  mother: cad s/p mi; father cad   Social History negative ETOH, negative Illicit drugs   + Tobacco Current (within 1 year)     Hyperlipidemia:    Anxiety:    Diabetes:    GERD:    Back Pain:    HTN:    Hypothyroid:    Cardiac Stent x  3:    mi:    Back Surgery:    Hysterectomy:        Admit Diagnosis:   ANGINA: Onset Date: 01-Aug-2014, Status: Active, Description: ANGINA  Home Medications: Medication Instructions Status  metoclopramide 5 mg tablet 1 tab(s) orally 3 to 4 times a day, As Needed Active  Pravachol 20 mg oral tablet 1 tab(s) orally once a day (at bedtime) Active  glipiZIDE 10 mg oral tablet 1 tab(s) orally 2 times a day (before meals) Active  aspirin 81 mg oral tablet 1 tab(s) orally once a day Active  Cymbalta 60 mg oral delayed release capsule 1 cap(s) orally once a day Active   levothyroxine 137 mcg (0.137 mg) oral tablet 1 tab(s) orally once a day Active  Nitrostat 0.4 mg sublingual tablet 1 tab(s) sublingual every 5 minutes, As Needed Active  Vitamin B-12 1000 mcg/mL injectable solution 1 milliliter(s) injectable once Active  albuterol 2.5 mg/3 mL (0.083%) inhalation solution 3 milliliter(s) inhaled every 6 hours, As Needed - for Wheezing Active  donepezil 5 mg oral tablet 1 tab(s) orally once a day (at bedtime) Active  Spiriva 18 mcg inhalation capsule 1 cap(s) inhaled once a day Active  Zofran ODT 8 mg oral tablet, disintegrating 1 tab(s) orally every 8 hours, As Needed Active  metFORMIN 500 mg oral tablet 2 tab(s) orally 2 times a day Active  gabapentin 100 mg oral capsule 1 cap(s) orally 3 times a day Active  etodolac 400 mg oral tablet 1 tab(s) orally 2 times a day Active  clopidogrel 75 mg oral tablet 1 tab(s) orally once a day Active  trazodone 100 mg oral tablet 1 tab(s) orally once a day (at bedtime) Active  Vasotec 10 mg oral tablet 1 tab(s) orally once a day Active  Coreg 6.25 mg oral tablet 1 tab(s) orally 2 times a day Active  magnesium oxide 400 mg (241.3 mg elemental magnesium) oral tablet 1 tab(s) orally once a day Active  Januvia 100 mg oral tablet 1 tab(s) orally once a day Active  Imdur 30 mg oral tablet, extended release 1 tab(s) orally once a day (in the morning) Active   Lab Results:  Routine Chem:  12-Jan-16 12:53   Lipase 217 (Result(s) reported on 01 Aug 2014 at 02:43PM.)  Glucose, Serum  299  BUN 13  Creatinine (comp)  1.33  Sodium, Serum  133  Potassium, Serum 3.5  Chloride, Serum  96  CO2, Serum 29  Calcium (Total), Serum 8.8  Anion Gap 8  Osmolality (calc) 278  eGFR (African American)  51  eGFR (Non-African American)  42 (eGFR values <12m/min/1.73 m2 may be an indication of chronic kidney disease (CKD). Calculated eGFR, using the MRDR Study equation, is useful in  patients with stable renal function. The eGFR  calculation will not be reliable in acutely ill patients when serum creatinine is changing rapidly. It is not useful in patients on dialysis. The eGFR calculation may not be applicable to patients at the low and high extremes of body sizes, pregnant women, and vegetarians.)  B-Type Natriuretic Peptide ([facility]) 91 (Result(s) reported on 01 Aug 2014 at 01:25PM.)  Cardiac:  12-Jan-16 12:53   Troponin I < 0.02 (0.00-0.05 0.05 ng/mL or less: NEGATIVE  Repeat testing in 3-6 hrs  if clinically indicated. >0.05 ng/mL: POTENTIAL  MYOCARDIAL INJURY. Repeat  testing in 3-6 hrs if  clinically indicated. NOTE: An increase or decrease  of 30% or more on serial  testing suggests a  clinically important  change)  CK, Total  273  CPK-MB, Serum  4.9 (Result(s) reported on 01 Aug 2014 at 04:02PM.)  Routine Coag:  12-Jan-16 12:53   Prothrombin 12.2  INR 0.9 (INR reference interval applies to patients on anticoagulant therapy. A single INR therapeutic range for coumarins is not optimal for all indications; however, the suggested range for most indications is 2.0 - 3.0. Exceptions to the INR Reference Range may include: Prosthetic heart valves, acute myocardial infarction, prevention of myocardial infarction, and combinations of aspirin and anticoagulant. The need for a higher or lower target INR must be assessed individually. Reference: The Pharmacology and Management of the Vitamin K  antagonists: the seventh ACCP Conference on Antithrombotic and Thrombolytic Therapy. PNTIR.4431 Sept:126 (3suppl): [id number]. A HCT value >55% may artifactually increase the PT.  In one study,  the increase was an average of 25%. Reference:  "Effect on Routine and Special Coagulation Testing Values of Citrate Anticoagulant Adjustment in Patients with High HCT Values." American Journal of Clinical Pathology 2006;126:400-405.)  Routine Hem:  12-Jan-16 12:53   WBC (CBC) 8.2  RBC (CBC) 3.86  Hemoglobin (CBC) 12.2   Hematocrit (CBC) 37.3  Platelet Count (CBC) 279 (Result(s) reported on 01 Aug 2014 at 01:05PM.)  MCV 97  MCH 31.7  MCHC 32.8  RDW  15.4   EKG:  EKG Interp. by me   Interpretation EKG shows NSR, 62, rare PVC, new lateral TWI (improving from PCP's office)   Radiology Results: XRay:    12-Jan-16 13:07, Chest Portable Single View  Chest Portable Single View   REASON FOR EXAM:    cp  COMMENTS:       PROCEDURE: DXR - DXR PORTABLE CHEST SINGLE VIEW  - Aug 01 2014  1:07PM     CLINICAL DATA:  Chest pain started this week.  COPD.  Cough.    EXAM:  PORTABLE CHEST - 1 VIEW    COMPARISON:  08/19/2013    FINDINGS:  The heart size and mediastinal contours are within normal limits.  Both lungs are clear. The visualized skeletal structures are  unremarkable.     IMPRESSION:  No active disease.      Electronically Signed    By: Kathreen Devoid    On: 08/01/2014 13:43         Verified By: Jennette Banker, M.D., MD    No Known Allergies:   Vital Signs/Nurse's Notes: **Vital Signs.:   12-Jan-16 15:58  Vital Signs Type Admission  Temperature Temperature (F) 98  Celsius 36.6  Temperature Source oral  Pulse Pulse 63  Respirations Respirations 18  Systolic BP Systolic BP 540  Diastolic BP (mmHg) Diastolic BP (mmHg) 81  Mean BP 96  Pulse Ox % Pulse Ox % 96  Pulse Ox Activity Level  At rest  Oxygen Delivery Room Air/ 21 %    Impression 69 year old female with history of CAD (s/p multiple PCIs as above), DM2 w/ diabetic nephropathy, ongoing tobacco abuse, COPD, mild dementia, hypothyroidism, HTN, HLD, h/o marijuana use who was sent to [facility] from her PCP's office today 2/2 back pain and abnormal EKG with new lateral TWI.  1. Back pain: anginal equivalent Similar to prior angional episodes, priro to stent placement ("not muscle or ligament, this is my heart!") -She is currently pain free. New EKG changes Long discussion with her, including risks and benefits of testing. She  prefers cardiac cath given sx are similar to previous anginal sx. Will schedule cardiac cath tomorrow #3 in Am  2. Abnormal EKG: -New lateral TWI that is improving when compared to study that was done at PCP's office -Initial troponin negative Normal EF on echo -Continue current cardiac medications, including aspirin and Plavix -Could advance Imdur to 60 mg daily cardiac cath tomorrow  3. Renal insufficiency:  -Likely 2/2 dehydration -Monitor  4. Ongoing tobacco abuse: -Cessation  5. Hypothyroidism: -Check TSH, previously uncontrolled -Possibly driving force if uncontrolled   6. DM2: -Uncontrolled -Per IM   Electronic Signatures: Rise Mu (PA-C)  (Signed 12-Jan-16 17:10)  Authored: General Aspect/Present Illness, History and Physical Exam, Review of System, Family & Social History, Past Medical History, Home Medications, Labs, EKG , Radiology, Allergies, Vital Signs/Nurse's Notes, Impression/Plan Ida Rogue (MD)  (Signed 12-Jan-16 19:50)  Authored: General Aspect/Present Illness, History and Physical Exam, Review of System, Family & Social History, Health Issues, EKG , Vital Signs/Nurse's Notes, Impression/Plan  Co-Signer: General Aspect/Present Illness, History and Physical Exam, Review of System, Family & Social History, Past Medical History, Home Medications, Labs, EKG , Radiology, Allergies, Vital Signs/Nurse's Notes, Impression/Plan   Last Updated: 12-Jan-16 19:50 by Ida Rogue (MD)

## 2014-11-19 NOTE — H&P (Signed)
PATIENT NAME:  Sarah Payne, Sarah Payne MR#:  [id number] DATE OF BIRTH:  21-Jul-1946  DATE OF ADMISSION:  08/01/2014  PRIMARY CARE PHYSICIAN: Marciano Sequin. Deborra Medina, MD with [facility].   PRIMARY CARDIOLOGIST: Minna Merritts, MD with [facility].  CHIEF COMPLAINT: Chest pain.   REFERRING EMERGENCY ROOM PHYSICIAN: Lennette Bihari A. Paduchowski, MD  CHIEF COMPLAINT: Chest pain on and off.   HISTORY OF PRESENT ILLNESS: A 69 year old female who has history of coronary artery disease and stent placement, hyperlipidemia, diabetes, hypertension, hypothyroid and smoking ongoing. She lives with family and states for the last 1 week she has been feeling some chest and back pain with every time, whenever she tried to walk even minimal 10-15 steps, from her bedroom to the bathroom. She also feels short of breath with that, and these symptoms go away when she takes some rest. On my further questioning, she said that when she had her MI 69 years old, she had this similar pain, which was in her back and not in her chest. For this pain, she did not seek any attention until today. She continued taking all of her usual medications. Finally yesterday night, she started taking nitroglycerin also, which was helping to relieve the pain. Today, she went to her PMD's office. She has an appointment with Dr. Rockey Situ next week, and so she thought she would just speak to Dr. Rockey Situ next week when she goes over there. When she went to work PMD's office today, after listening to the symptoms, they did an EKG and found some new changes and sent her to the Emergency Room today.   In the Emergency Room, her troponin was found to be negative, but there were some T-wave inversion changes, which are new compared to previous EKG, and so given to the hospitalist team for further management.   REVIEW OF SYSTEMS:  CONSTITUTIONAL: Negative for fever, fatigue, weakness, pain, or weight loss.  EYES: No blurred or double vision, discharge, or redness.   EARS, NOSE, THROAT: No tinnitus, ear pain, or hearing loss.  RESPIRATORY: No cough, wheezing. No tightness or shortness of breath.  CARDIOVASCULAR: There is some chest pain, but no edema, arrhythmia, or palpitation.  GASTROINTESTINAL: No nausea, vomiting, diarrhea, abdominal pain.  GENITOURINARY: No dysuria, hematuria, increased frequency.  ENDOCRINE: No heat or cold intolerance. No excessive sweating.  SKIN: No acne, rashes, or lesions.  MUSCULOSKELETAL: No pain or swelling in the joints.  NEUROLOGICAL: No numbness, weakness, tremor, or vertigo.  PSYCHIATRIC: No anxiety, insomnia, bipolar disorder.   PAST MEDICAL HISTORY:  1.  Coronary artery disease, status post non-Q-wave myocardial infarction in February 2013  stent to mid circumflex, moderate to severe left anterior descending coronary artery stenosis, and stent in the distal RCA. She again had myocardial infarction in January 2014, where she had 2 stents in the proximal and mid RCA.  2.  History of hypertension.  3.  Hyperlipidemia.  4.  Diabetes.  5.  History of depression.  6.  Vascular dementia.  7.  Gastroesophageal reflux disease.  8.  Chronic back pain.  9.  Diabetic gastropathy.  10.  Hypothyroidism.  11.  Chronic smoker with ongoing smoking.  12.  Marijuana use in the past.   PAST SURGICAL HISTORY: Back surgery, hysterectomy, and gallbladder surgery.   FAMILY HISTORY: Hypertension, cerebrovascular accident and myocardial infarction in her mother. Father had dementia, but overall healthy. Brother died of throat cancer.   SOCIAL HISTORY: Extensive tobacco use, 1 pack of cigarettes every day for  the last almost 40 years. No alcohol. She uses marijuana. She lives with her husband.   HOME MEDICATIONS:  1.  Zofran 8 mg every 8 hours as needed.  2.  Vitamin B12 1000 mcg injection once a month.  3.  Vasotec 10 mg oral tablet once a day.  4.  Trazodone 100 mg oral once a day.  5.  Spiriva 18 mcg inhalation once a day.  6.   Pravachol 20 mg oral tablet once a day.  7.  Nitroglycerin 0.4 mg tablet every 5 minutes as needed for chest pain.  8.  Metoclopramide 5 mg oral tablet 3 to 4 times a day as needed for nausea.  9.  Metformin 500 mg oral 2 tablets 2 times a day.  10.  Magnesium oxide 400 mg once a day.  11.  Levothyroxine 137 mcg once a day.  12.  Januvia 100 mg oral tablet once a day.  13.  Imdur 30 mg oral tablet once a day.  14.  Glipizide 10 mg oral tablet 2 times a day.  15.  Gabapentin 100 mg oral 3 times a day.  16.  Donepezil 5 mg oral tablet once a day.  17.  Cymbalta 60 mg oral once a day. 18.  carvedilol 6.25 mg oral 2 times a day.  19.  Plavix 75 mg oral once a day.  20.  Aspirin 81 mg once a day.  21.  Albuterol 3 mL inhalation every 6 hours as needed.   PHYSICAL EXAMINATION:  VITAL SIGNS: In ER, temperature 98, pulse 62, respirations 18, blood pressure 124/96 and pulse oximetry 100 on room air.   LABORATORY DATA: Potassium 3.5, chloride 96, CO2 of 29, calcium 8.8. Lipase 217. Troponin less than 0.02. WBC 8.2, hemoglobin 12.2, platelet count 279,000. INR is 0.9. Chest X-ray: No active disease. EKG shows some T-wave inversion in lateral chest leads, which is new.   ASSESSMENT AND PLAN: A 69 year old female who has an extensive cardiac history and hypertension, hyperlipidemia, active smoker, came to the Emergency Room with having exertional chest pain for the last 1 week.  1.  Exercise-induced angina. As the patient has a strong cardiac history, we will admit to telemetry floor, follow serial troponins. Currently troponin is negative. If it started going up then we might have to start on heparin or Lovenox. Will call cardiology consult. Meanwhile, Dr. Rockey Situ to decide further course. Will continue her cardiac medications over here, aspirin, Plavix and beta blockers meanwhile.  2.  Diabetes. Continued home medication while she is in the hospital. Blood sugar is stable.  3.  Hypertension. Continue  carvedilol, enalapril, isosorbide mononitrate.  4.  Hyperlipidemia. Continue pravastatin.  5.  Smoking. Smoking cessation counseling is done for 4 minutes. She agreed to try Nicotrol inhalers while here in the hospital.   CODE STATUS: Full Code.   TOTAL TIME SPENT ON THIS ADMISSION: 50 minutes.   ____________________________ Ceasar Lund Anselm Jungling, MD vgv:MT D: 08/01/2014 15:35:05 ET T: 08/01/2014 16:23:37 ET JOB#: [id number]  cc: Ceasar Lund. Anselm Jungling, MD, <Dictator> Marciano Sequin. Deborra Medina, MD Minna Merritts, MD Rosalio Macadamia [facility] MD ELECTRONICALLY SIGNED 08/14/2014 23:09

## 2014-11-19 NOTE — Discharge Summary (Signed)
PATIENT NAME:  Sarah Payne, Sarah Payne MR#:  [id number] DATE OF BIRTH:  Jun 11, 1946  DATE OF ADMISSION:  08/01/2014 DATE OF DISCHARGE:  08/02/2014  ADMITTING DIAGNOSIS: Unstable angina.   DISCHARGE DIAGNOSES:  1.  Back pain, suspected cardiac angina with normal cardiac enzymes x 3, normal echocardiogram, and no new changes on cardiac catheterization performed on 08/02/2014 by Dr. Rockey Situ.  2.  Hyperlipidemia, hypertriglyceridemia due to poorly controlled hypothyroidism.  3.  Hypothyroidism with TSH of 100.  4.  Diabetes mellitus with hemoglobin A1c 10.9, now on insulin therapy.  5.  Tobacco abuse, ongoing.   DISCHARGE CONDITION: Stable.   DISCHARGE MEDICATIONS:  1. The patient is to continue Plavix 75 mg p.o. daily.  2. Trazodone 100 mg p.o. at bedtime.  3. Vasotec 20 mg p.o. daily.  4. Coreg 6.25 mg p.o. twice daily.  5. Magnesium oxide 400 mg p.o. daily.  6. Januvia 100 mg p.o. daily.  7. Glipizide 10 mg p.o. twice daily before meals.  8. Aspirin 81 mg p.o. daily.  9. Cymbalta 60 mg p.o. daily.  10. Nitrostat 0.4 mg sublingually every 5 minutes as needed.  11. Vitamin B12, 1000 mcg injectable once monthly.  12. Albuterol 3 mL every 6 hours as needed.  13. Donepezil 5 mg p.o. at bedtime.  14. Spiriva 1 inhalation once daily.  15. Zofran ODT 8 mg every 8 hours as needed.  16. Metformin 500 mg 2 tablets twice daily.  17. Gabapentin 100 mg 3 times daily.  18. Etodolac 400 mg p.o. twice daily.  19. Pravachol 10 mg p.o. at bedtime.  20. Reglan 5 mg 3-4 times daily as needed.  21. Imdur 60 mg p.o. daily.  22. Levothyroxine 150 mcg p.o. daily.  23. Nicotine oral inhaler 1 inhalation every 1-2 hours as needed.  24. Insulin Lantus 15 units subcutaneously at bedtime.   HOME OXYGEN: None.   DIET: 2 gram salt, low-fat, low-cholesterol, carbohydrate -controlled diet, regular consistency.  ACTIVITY LIMITATIONS: As tolerated.   FOLLOW-UP APPOINTMENTS:  With Dr. Arnette Norris in 2 days after  discharge, Dr. Rockey Situ in 1 week after discharge. The patient was also advised to follow up with [place] for diabetic teaching and insulin injections. She was agreeable to have home health nurse to come to her home once only. She otherwise refused home health, physical therapy, or RN visits.    CONSULTANTS: Care management, social work, Christell Faith, [place] for Dr. Fletcher Anon, Dr. Rockey Situ.   RADIOLOGIC STUDIES: Chest x-ray portable single view 08/01/2014 showed no active disease. Echocardiogram 08/01/2014 showed left ventricular ejection fraction by visual estimation 55%-60%, normal global left ventricular systolic function, impaired relaxation pattern of left ventricular diastolic filling, normal right ventricular size and systolic function, mild aortic valve sclerosis without stenosis, normal right ventricular systolic pressure.   HOSPITAL COURSE:  The patient is a 69 year old female who presented to the hospital with complaints of back pains on and off. Please refer to Dr. Nichola Sizer admission note on 08/01/2014.  Apparently she was having some chest as well as back pain with exertion for the past 1 week. On arrival to the Emergency Room she was noted to have some EKG changes which were new and she was admitted. In the Emergency Room the patient's vital signs, temperature was 98, pulse was 62, respiration rate was 18, blood pressure 124/96, saturation was 100% on room air. Physical exam was unremarkable. The patient's EKG showed T wave inversions in lateral leads which were new. The patient's laboratory data done on  arrival to the hospital 08/01/2014 revealed glucose level of 299, creatinine of 1.33, sodium 133, otherwise BMP was unremarkable. The patient's lipase level was normal at 217. Cardiac enzymes revealed mild elevation of CK total of 273, MB fraction was 4.9, however troponin was normal at less than 0.02 through all 3 sets of cardiac enzymes. The patient's CBC was within normal limits with white blood  cell count 8.2, hemoglobin 12.2, platelet count 279,000. Coagulation panel was unremarkable. The patient's chest x-ray was normal. The patient was admitted to the hospital for further evaluation. Her cardiac enzymes were cycled and consultation with cardiologist was obtained, Mr. Christell Faith saw the patient in consultation 08/01/2014 and recommended cardiac catheterization because of concerns of anginal equivalent.  The patient underwent cardiac catheterization and according to the cardiologist's note post cardiac catheterization the patient's cardiac catheterization revealed stable disease when compared to prior study from December 2014. According to cardiologist if she still has symptoms she may benefit from a nuclear stress test to evaluate the LAD.   On the day of discharge the patient denied any chest pains. She was able to ambulate with no significant discomfort. She is being discharged home with the above-mentioned medications on followup.  Her Imdur however was advanced to 60 mg p.o. daily dose.   In regards to the hyperlipidemia the patient's lipid panel was performed while she was in the hospital and it was found to be markedly abnormal. The patient's LDL was found to be 180, total cholesterol was 292, triglycerides were 388, and HDL was 34. It was felt that the patient's hyperlipidemia and hypertriglyceridemia were likely due to poorly controlled diabetes as well as hypothyroidism. The patient's hemoglobin A1c was found to be 10.9. The patient was advised to advance her insulin dose and follow up with [employer].    In regards to thyroid function, the patient's TSH was checked and was found to be more than 100, the patient's free thyroxine was low at 0.18. The patient's Synthroid was advanced to current doses and the patient was advised to take Synthroid very early in the morning and not to eat or drink for approximately 1 hour after she takes Synthroid to allow absorption. It is recommended to  follow the patient's TSH as an outpatient in the next few weeks after discharge.   In regards to tobacco abuse, the patient was counseled and nicotine replacement therapy was initiated. The patient felt satisfactory and is being discharged home with the above-mentioned medications and followup.   On the day of discharge temperature was 98.7, pulse was 59-62, respiration rate was 18-20, blood pressure 109/69, saturation was 97% on room air at rest.   TIME SPENT: 40 minutes.   ____________________________ Theodoro Grist, MD rv:bu D: 08/02/2014 19:46:21 ET T: 08/02/2014 20:50:15 ET JOB#: [id number]  cc: Theodoro Grist, MD, <Dictator> Marciano Sequin. Deborra Medina, MD Minna Merritts, MD [place] MD ELECTRONICALLY SIGNED 08/10/2014 10:03

## 2014-11-28 ENCOUNTER — Telehealth: Payer: Self-pay | Admitting: Gastroenterology

## 2014-11-29 ENCOUNTER — Ambulatory Visit (INDEPENDENT_AMBULATORY_CARE_PROVIDER_SITE_OTHER): Payer: Medicare Other | Admitting: Physician Assistant

## 2014-11-29 ENCOUNTER — Encounter: Payer: Self-pay | Admitting: Physician Assistant

## 2014-11-29 VITALS — BP 108/70 | HR 72 | Ht 64.75 in | Wt 133.4 lb

## 2014-11-29 DIAGNOSIS — R110 Nausea: Secondary | ICD-10-CM | POA: Diagnosis not present

## 2014-11-29 DIAGNOSIS — R634 Abnormal weight loss: Secondary | ICD-10-CM

## 2014-11-29 DIAGNOSIS — Z8619 Personal history of other infectious and parasitic diseases: Secondary | ICD-10-CM | POA: Diagnosis not present

## 2014-11-29 DIAGNOSIS — I2510 Atherosclerotic heart disease of native coronary artery without angina pectoris: Secondary | ICD-10-CM | POA: Diagnosis not present

## 2014-11-29 DIAGNOSIS — R197 Diarrhea, unspecified: Secondary | ICD-10-CM

## 2014-11-29 MED ORDER — SACCHAROMYCES BOULARDII 250 MG PO CAPS: 250.0000 mg | ORAL_CAPSULE | Freq: Two times a day (BID) | ORAL | Status: DC

## 2014-11-29 MED ORDER — ONDANSETRON HCL 4 MG PO TABS: ORAL_TABLET | ORAL | Status: DC

## 2014-11-29 NOTE — Progress Notes (Signed)
Patient ID: Sarah Payne, female   DOB: 1945-09-14, 69 y.o.   MRN: [id number]   Subjective:    Patient ID: Sarah Payne, female    DOB: Apr 15, 1946, 69 y.o.   MRN: [id number]  HPI Sarah Payne is a 70 year old white female known to Dr. Deatra Ina. HEENT in March 2016 at that time complaining of persistent diarrhea. She comes in today with complaints of ongoing diarrhea abdominal discomfort nausea queasiness fatigue and weight loss. Patient. He had an Escherichia coli infectious colitis in November 2015 and then shortly after that developed recurrent diarrhea and tested positive for C. difficile. She had been treated with a course of metronidazole and says that her symptoms improved but the diarrhea came back a few weeks later. Basically ever since that time she has had ongoing problems with diarrhea with 6 or 7 bowel movements per day. Other symptoms as outlined above. She has lost about 18 pounds over the past 7 months. She says she has no energy and feels bad all the time. She was retested with a GI pathogen panel in March and C. difficile negative. She was given a prescription for vancomycin and a probiotic but did not understand that she should proceed with the vancomycin even when the stool cultures came back negative. Exline She also had colonoscopy in April 2016 for the diarrhea random biopsies were done and these were unremarkable she did have 3 tubular adenomas removed and is now slated for 3 year interval follow-up. At that time there was no obvious evidence for C. difficile.  Review of Systems Pertinent positive and negative review of systems were noted in the above HPI section.  All other review of systems was otherwise negative.  Outpatient Encounter Prescriptions as of 11/29/2014  Medication Sig  . albuterol (PROVENTIL HFA;VENTOLIN HFA) 108 (90 BASE) MCG/ACT inhaler Inhale 2 puffs into the lungs every 6 (six) hours as needed for wheezing.  Marland Kitchen aspirin 81 MG tablet Take 81 mg by mouth daily.    Marland Kitchen  atorvastatin (LIPITOR) 40 MG tablet Take 1 tablet (40 mg total) by mouth daily.  . Calcium & Magnesium Carbonates (MYLANTA PO) Take by mouth.    . carvedilol (COREG) 3.125 MG tablet Take 2 tablets (6.25 mg total) by mouth 2 (two) times daily.  . cholestyramine light (PREVALITE) 4 G packet Take 1 packet (4 g total) by mouth 2 (two) times daily.  . clonazePAM (KLONOPIN) 0.5 MG tablet Take 1 tablet (0.5 mg total) by mouth at bedtime as needed for anxiety.  . clopidogrel (PLAVIX) 75 MG tablet TAKE 1 TABLET (75 MG TOTAL) BY MOUTH DAILY.  . cyanocobalamin (,VITAMIN B-12,) 1000 MCG/ML injection Inject 1 mL (1,000 mcg total) into the muscle every 30 (thirty) days. INJECT 1 ML (1,000 MCG TOTAL) INTO THE MUSCLE ONCE.  Marland Kitchen diazepam (VALIUM) 2 MG tablet Take 1 tablet (2 mg total) by mouth every 8 (eight) hours as needed for muscle spasms.  . DULoxetine (CYMBALTA) 60 MG capsule TAKE 1 CAPSULE BY MOUTH DAILY.  Marland Kitchen enalapril (VASOTEC) 5 MG tablet Take 1 tablet (5 mg total) by mouth daily.  Marland Kitchen escitalopram (LEXAPRO) 10 MG tablet Take 1 tablet (10 mg total) by mouth at bedtime.  Marland Kitchen FLORA-Q (FLORA-Q) CAPS capsule Take 1 capsule by mouth daily.  . fluticasone (FLOVENT HFA) 220 MCG/ACT inhaler Inhale 1 puff into the lungs 2 (two) times daily.  Marland Kitchen glipiZIDE (GLUCOTROL) 10 MG tablet Before breakfast and supper (evening meal)  . glucose blood (FREESTYLE LITE) test strip Check  blood sugar 3 times daily to check blood sugars - Uncontrolled diabetes mellitus  . glucose monitoring kit (FREESTYLE) monitoring kit 1 each by Does not apply route as needed for other. PHARMACY PLEASE SUBSTITUTE WHATEVER GLUCOMETER YOU CARRY  . insulin glargine (LANTUS) 100 UNIT/ML injection 15 units qhs  . isosorbide mononitrate (IMDUR) 30 MG 24 hr tablet Take 1 tablet (30 mg total) by mouth daily.  . Levothyroxine Sodium 150 MCG CAPS Take 1 capsule (150 mcg total) by mouth daily before breakfast.  . metFORMIN (GLUCOPHAGE) 500 MG tablet Take 2 tablets  (1,000 mg total) by mouth 2 (two) times daily with a meal.  . metroNIDAZOLE (FLAGYL) 500 MG tablet Take 1 tablet (500 mg total) by mouth 3 (three) times daily. Avoid alcohol  . Na Sulfate-K Sulfate-Mg Sulf (SUPREP BOWEL PREP) SOLN USE PER PREP INSTRUCTIONS  . nitroGLYCERIN (NITROSTAT) 0.4 MG SL tablet Place 1 tablet (0.4 mg total) under the tongue every 5 (five) minutes as needed.  Marland Kitchen omeprazole (PRILOSEC) 20 MG capsule Take 1 capsule (20 mg total) by mouth 2 (two) times daily before a meal.  . ondansetron (ZOFRAN) 4 MG tablet Take 1 tab every 6 hours as needed for nausea.  . promethazine (PHENERGAN) 12.5 MG tablet Take 1 tablet (12.5 mg total) by mouth every 8 (eight) hours as needed for nausea or vomiting.  . sitaGLIPtin (JANUVIA) 100 MG tablet Take 1 tablet (100 mg total) by mouth daily.  Marland Kitchen tiotropium (SPIRIVA HANDIHALER) 18 MCG inhalation capsule Place 1 capsule (18 mcg total) into inhaler and inhale daily.  . traZODone (DESYREL) 100 MG tablet TAKE 1 TABLET (100 MG TOTAL) BY MOUTH AT BEDTIME.  . [DISCONTINUED] ondansetron (ZOFRAN) 4 MG tablet Take 1 tablet (4 mg total) by mouth 3 (three) times daily as needed for nausea or vomiting.  . [DISCONTINUED] SUPREP BOWEL PREP SOLN Take 1 kit by mouth once.  . saccharomyces boulardii (FLORASTOR) 250 MG capsule Take 1 capsule (250 mg total) by mouth 2 (two) times daily.   No facility-administered encounter medications on file as of 11/29/2014.   Allergies  Allergen Reactions  . No Known Allergies    Patient Active Problem List   Diagnosis Date Noted  . Benign neoplasm of colon 11/02/2014  . Family history of colon cancer 11/02/2014  . Internal hemorrhoids 11/02/2014  . Adjustment disorder with mixed anxiety and depressed mood 08/16/2014  . Orthostatic hypotension 08/08/2014  . Nausea with vomiting 05/23/2014  . Esophageal reflux 05/23/2014  . Chronic anticoagulation 05/23/2014  . Diverticulosis of colon without hemorrhage 05/17/2014  .  Impingement syndrome of left shoulder   . Tendonitis of left rotator cuff   . Pre-operative clearance 03/10/2014  . Grief 03/10/2014  . Loss of weight 03/10/2014  . Diarrhea 03/10/2014  . Positive urine drug screen 11/22/2013  . Pain in joint, upper arm 04/12/2013  . Myalgia 04/06/2013  . COPD exacerbation 03/29/2013  . COPD, moderate 03/29/2013  . Sweating 03/24/2013  . Malaise 03/24/2013  . Noncompliance with diabetes treatment 02/27/2013  . Gastritis and gastroduodenitis 02/27/2013  . Obstructive sleep apnea 02/25/2013  . Leg weakness, bilateral 10/07/2012  . Other malaise and fatigue 10/06/2012  . Chronic systolic congestive heart failure 05/21/2012  . Lichen sclerosus et atrophicus 05/20/2012  . Bronchitis, chronic obstructive 05/20/2012  . Headache(784.0) 10/15/2011  . Angina at rest 09/25/2011  . Diabetes mellitus type 2 with peripheral artery disease 08/28/2011  . History of lumbar discectomy 06/29/2011  . Depression 06/24/2011  . Hypothyroidism 05/03/2011  .  CAD, multiple vessel 02/24/2011  . Tobacco abuse 02/24/2011  . Hyperlipidemia 02/24/2011   History   Social History  . Marital Status: Married    Spouse Name: N/A  . Number of Children: N/A  . Years of Education: N/A   Occupational History  . Not on file.   Social History Main Topics  . Smoking status: Current Every Day Smoker -- 0.25 packs/day for 45 years    Types: Cigarettes  . Smokeless tobacco: Never Used     Comment: Has cut back, trying to quit.   . Alcohol Use: No  . Drug Use: Yes    Special: Marijuana     Comment: last night 4.13.16  . Sexual Activity: Not on file   Other Topics Concern  . Not on file   Social History Narrative   Lives at home with her husband in [place].  Previously used marijuana - quit.      Regular exercise: no/ pain from a frozen rotator cuff   Caffeine use: coffee daily and pepsi    Ms. Burby's family history includes Colon cancer in her sister; Heart  attack in her mother; Heart disease in her father and mother; Liver cancer in her brother; Throat cancer in her brother.      Objective:    Filed Vitals:   11/29/14 1408  BP: 108/70  Pulse: 72    Physical Exam  well-developed older white female in no acute distress, pleasant blood pressure 108/70 pulse 72 height 5 foot 4 weight 133. HEENT; nontraumatic normocephalic EOMI PERRLA sclera anicteric, Supple; no JVD, Cardiovascular; regular rate and rhythm with S1-S2 no murmur or gallop, Pulmonary; clear bilaterally, Abdomen; soft she has some tenderness in the left mid and left lower quadrant there is no guarding or rebound no palpable mass or hepatosplenomegaly bowel sounds are hyperactive, Rectal ;exam not done, Ext; no clubbing cyanosis or edema skin warm and dry, Psych; mood and affect appropriate       Assessment & Plan:   #1 69 yo female with 7 month  hx of diarrhea, nausea, abdominal discomfort ,fatigue,and weight loss. Pt with documented cdiff 05/2014- brief resolution after a course of flagyl- I susoect she has had relapsed Cdiff  Random bx negative for microscopic colitis #2 adenomatous polyps #3 CAD- on Plavix #4 AODM #5 OSA #6 CHF  Plan;Will proceed with treatment for presumed Cdiff with vancomycin 250 mg 4 x daily x 14 days Florastor BID x one month Pt is asked to call in 10-12 days if improved but still symptomatic will give a longer tapered course of Vancomycin . Zofran 4 mg po q 6 hours prn nausea Will see back in office in 3 weeks    Amy Genia Harold PA-C 11/29/2014   Cc: Lucille Passy, MD

## 2014-11-29 NOTE — Telephone Encounter (Signed)
States she has intense nausea. She has not been able to eat normally for a week. She can force liquids. She says she cannot go on like this. Appointment made evaluation. She states none of the medications help with her symptoms.

## 2014-11-29 NOTE — Patient Instructions (Signed)
We sent prescriptions to [facility]. 1. Florastor probiotic 2. Zofran 4 mg for nausea.  Call us back in 10-12 days if you are not feeling better as we may need a longer course of the antibiotic ( Vancomycin).   Call after you get back from the beach and make a follow up appointment to see [facility].

## 2014-11-30 NOTE — Progress Notes (Signed)
Reviewed and agree with management. Sarah Payne D. Yailene Badia, M.D., FACG  

## 2014-12-19 ENCOUNTER — Telehealth: Payer: Self-pay | Admitting: Gastroenterology

## 2014-12-19 NOTE — Telephone Encounter (Signed)
Noted  

## 2014-12-21 ENCOUNTER — Other Ambulatory Visit: Payer: Self-pay | Admitting: Family Medicine

## 2014-12-21 ENCOUNTER — Encounter: Payer: Self-pay | Admitting: Family Medicine

## 2014-12-21 ENCOUNTER — Ambulatory Visit (INDEPENDENT_AMBULATORY_CARE_PROVIDER_SITE_OTHER): Payer: Medicare Other | Admitting: Family Medicine

## 2014-12-21 VITALS — BP 138/90 | HR 76 | Temp 98.2°F | Wt 137.5 lb

## 2014-12-21 DIAGNOSIS — R197 Diarrhea, unspecified: Secondary | ICD-10-CM

## 2014-12-21 DIAGNOSIS — E538 Deficiency of other specified B group vitamins: Secondary | ICD-10-CM

## 2014-12-21 DIAGNOSIS — E039 Hypothyroidism, unspecified: Secondary | ICD-10-CM

## 2014-12-21 DIAGNOSIS — E038 Other specified hypothyroidism: Secondary | ICD-10-CM | POA: Diagnosis not present

## 2014-12-21 DIAGNOSIS — I2510 Atherosclerotic heart disease of native coronary artery without angina pectoris: Secondary | ICD-10-CM | POA: Diagnosis not present

## 2014-12-21 LAB — TSH: TSH: 53.83 u[IU]/mL — ABNORMAL HIGH (ref 0.35–4.50)

## 2014-12-21 LAB — T4, FREE: Free T4: 0.21 ng/dL — ABNORMAL LOW (ref 0.60–1.60)

## 2014-12-21 MED ORDER — LEVOTHYROXINE SODIUM 175 MCG PO TABS: 175.0000 ug | ORAL_TABLET | Freq: Every day | ORAL | Status: DC

## 2014-12-21 MED ORDER — LEVOTHYROXINE SODIUM 150 MCG PO CAPS: 150.0000 ug | ORAL_CAPSULE | Freq: Every day | ORAL | Status: DC

## 2014-12-21 MED ORDER — CYANOCOBALAMIN 1000 MCG/ML IJ SOLN
1000.0000 ug | Freq: Once | INTRAMUSCULAR | Status: AC
  Administered 2014-12-21: 1000 ug via INTRAMUSCULAR

## 2014-12-21 NOTE — Assessment & Plan Note (Signed)
Has not been well controlled. No with recent weight loss and that she had been out of rx for a month when we lasted checked thyroid panel, will recheck today. eRx refilled.

## 2014-12-21 NOTE — Addendum Note (Signed)
Addended by: Modena Nunnery on: 12/21/2014 08:32 AM   Modules accepted: Orders

## 2014-12-21 NOTE — Progress Notes (Signed)
Subjective:   Patient ID: Sarah Payne, female    DOB: 23-Oct-1945, 69 y.o.   MRN: [id number]  Sarah Payne is a pleasant 69 y.o. year old female who presents to clinic today with Follow-up  on 12/21/2014  HPI:  Has been following with GI for past 7 months for persistent diarrhea, nausea and weight loss. In November 2015, developed infectious E coli and then was tested for C diff due to recurrent diarrhea and tested positive.  Had been treated with flagyl and symptoms initially resolved but since that time, ongoing issues with intermittent diarrhea- 6 or 7 BMs per day.  Has lost almost 20 pounds during this period of time.  In March 2016, C diff neg.  Given vancomycin and probiotic but did not take vancomycin since her stool cx neg for c diff.  Colonoscopy by Dr. Deatra Ina in 10/2014- neg, no signs of C. Diff.   3 year recall due to tubular adenomas.     Last saw Nicoletta Ba on 5/11.   Note reviewed.  Prescribed Vancomycin 250 mg 4 times daily x 14 days, Florastor twice daily.  Still weak but already feeling better.  Has not had diarrhea or needed anti emetics in 6 days.  Hypothyroidism- TSH very high in January.  Had been out of her synthroid for almost a month at that time.  Has restarted it but needs refill again today.    Lab Results  Component Value Date   TSH 60.34* 08/16/2014    Current Outpatient Prescriptions on File Prior to Visit  Medication Sig Dispense Refill  . albuterol (PROVENTIL HFA;VENTOLIN HFA) 108 (90 BASE) MCG/ACT inhaler Inhale 2 puffs into the lungs every 6 (six) hours as needed for wheezing. 1 Inhaler 11  . aspirin 81 MG tablet Take 81 mg by mouth daily.      Marland Kitchen atorvastatin (LIPITOR) 40 MG tablet Take 1 tablet (40 mg total) by mouth daily. 90 tablet 3  . Calcium & Magnesium Carbonates (MYLANTA PO) Take by mouth.      . carvedilol (COREG) 3.125 MG tablet Take 2 tablets (6.25 mg total) by mouth 2 (two) times daily. 180 tablet 3  . cholestyramine light (PREVALITE)  4 G packet Take 1 packet (4 g total) by mouth 2 (two) times daily. 60 packet 6  . clonazePAM (KLONOPIN) 0.5 MG tablet Take 1 tablet (0.5 mg total) by mouth at bedtime as needed for anxiety. 20 tablet 1  . clopidogrel (PLAVIX) 75 MG tablet TAKE 1 TABLET (75 MG TOTAL) BY MOUTH DAILY. 90 tablet 3  . cyanocobalamin (,VITAMIN B-12,) 1000 MCG/ML injection Inject 1 mL (1,000 mcg total) into the muscle every 30 (thirty) days. INJECT 1 ML (1,000 MCG TOTAL) INTO THE MUSCLE ONCE. 1 mL 0  . diazepam (VALIUM) 2 MG tablet Take 1 tablet (2 mg total) by mouth every 8 (eight) hours as needed for muscle spasms. 20 tablet 0  . DULoxetine (CYMBALTA) 60 MG capsule TAKE 1 CAPSULE BY MOUTH DAILY. 90 capsule 3  . enalapril (VASOTEC) 5 MG tablet Take 1 tablet (5 mg total) by mouth daily. 90 tablet 3  . escitalopram (LEXAPRO) 10 MG tablet Take 1 tablet (10 mg total) by mouth at bedtime. 30 tablet 0  . FLORA-Q (FLORA-Q) CAPS capsule Take 1 capsule by mouth daily. 30 capsule 1  . fluticasone (FLOVENT HFA) 220 MCG/ACT inhaler Inhale 1 puff into the lungs 2 (two) times daily. 2 Inhaler 1  . glipiZIDE (GLUCOTROL) 10 MG tablet Before  breakfast and supper (evening meal) 180 tablet 1  . glucose blood (FREESTYLE LITE) test strip Check blood sugar 3 times daily to check blood sugars - Uncontrolled diabetes mellitus 300 each 3  . glucose monitoring kit (FREESTYLE) monitoring kit 1 each by Does not apply route as needed for other. PHARMACY PLEASE SUBSTITUTE WHATEVER GLUCOMETER YOU CARRY 1 each 1  . insulin glargine (LANTUS) 100 UNIT/ML injection 15 units qhs 10 mL 0  . isosorbide mononitrate (IMDUR) 30 MG 24 hr tablet Take 1 tablet (30 mg total) by mouth daily. 90 tablet 3  . Levothyroxine Sodium 150 MCG CAPS Take 1 capsule (150 mcg total) by mouth daily before breakfast. 30 capsule 0  . metFORMIN (GLUCOPHAGE) 500 MG tablet Take 2 tablets (1,000 mg total) by mouth 2 (two) times daily with a meal. 180 tablet 3  . nitroGLYCERIN  (NITROSTAT) 0.4 MG SL tablet Place 1 tablet (0.4 mg total) under the tongue every 5 (five) minutes as needed. 25 tablet 2  . omeprazole (PRILOSEC) 20 MG capsule Take 1 capsule (20 mg total) by mouth 2 (two) times daily before a meal. 60 capsule 11  . saccharomyces boulardii (FLORASTOR) 250 MG capsule Take 1 capsule (250 mg total) by mouth 2 (two) times daily. 60 capsule 0  . sitaGLIPtin (JANUVIA) 100 MG tablet Take 1 tablet (100 mg total) by mouth daily. 90 tablet 3  . tiotropium (SPIRIVA HANDIHALER) 18 MCG inhalation capsule Place 1 capsule (18 mcg total) into inhaler and inhale daily. 30 capsule 2  . traZODone (DESYREL) 100 MG tablet TAKE 1 TABLET (100 MG TOTAL) BY MOUTH AT BEDTIME. 90 tablet 3  . ondansetron (ZOFRAN) 4 MG tablet Take 1 tab every 6 hours as needed for nausea. (Patient not taking: Reported on 12/21/2014) 30 tablet 0  . promethazine (PHENERGAN) 12.5 MG tablet Take 1 tablet (12.5 mg total) by mouth every 8 (eight) hours as needed for nausea or vomiting. (Patient not taking: Reported on 12/21/2014) 20 tablet 0   No current facility-administered medications on file prior to visit.    Allergies  Allergen Reactions  . No Known Allergies     Past Medical History  Diagnosis Date  . Diabetes mellitus   . Hypertension   . Hypothyroidism   . Depression   . Vascular dementia   . Tobacco abuse     a. 45 pack year history - quit 09/16/11  . Chronic low back pain   . Coronary artery disease     a. s/p PCI/DES LCX x 3;  b. NSTEMI 09/16/11;  c.   09/17/11 Cath [facility]) Severe RCA/LCX dzs - RCA treated, LCX intervention pending w/ Dr. Clayborn Bigness  . Sleep apnea     mild-does not use cpap  . Impingement syndrome of left shoulder   . Tendonitis of left rotator cuff   . Diverticulosis     Past Surgical History  Procedure Laterality Date  . Back surgery    . Abdominal hysterectomy    . Appendectomy    . Cardiac catheterization  2013  . Coronary angioplasty  2012    stent x 3   . Coronary  angioplasty with stent placement  2013  . Cardiac catheterization  2016  . Abdominal hysterectomy    . Colonoscopy N/A 11/02/2014    Procedure: COLONOSCOPY;  Surgeon: Inda Castle, MD;  Location: [place];  Service: Endoscopy;  Laterality: N/A;    Family History  Problem Relation Age of Onset  . Heart attack Mother  First MI @ 49 - Died @ 77  . Heart disease Mother   . Heart disease Father     Died @ 44  . Throat cancer Brother   . Liver cancer Brother   . Colon cancer Sister     History   Social History  . Marital Status: Married    Spouse Name: N/A  . Number of Children: N/A  . Years of Education: N/A   Occupational History  . Not on file.   Social History Main Topics  . Smoking status: Current Every Day Smoker -- 0.25 packs/day for 45 years    Types: Cigarettes  . Smokeless tobacco: Never Used     Comment: Has cut back, trying to quit.   . Alcohol Use: No  . Drug Use: Yes    Special: Marijuana     Comment: last night 4.13.16  . Sexual Activity: Not on file   Other Topics Concern  . Not on file   Social History Narrative   Lives at home with her husband in [place].  Previously used marijuana - quit.      Regular exercise: no/ pain from a frozen rotator cuff   Caffeine use: coffee daily and pepsi   The PMH, PSH, Social History, Family History, Medications, and allergies have been reviewed in [facility], and have been updated if relevant.    Review of Systems  Constitutional: Positive for fatigue. Negative for fever.  HENT: Negative.   Eyes: Negative.   Respiratory: Negative.   Cardiovascular: Negative.   Gastrointestinal: Negative.   Genitourinary: Negative.   Musculoskeletal: Negative.   Neurological: Negative.   Hematological: Negative.   Psychiatric/Behavioral: Negative.   All other systems reviewed and are negative.      Objective:    BP 138/90 mmHg  Pulse 76  Temp(Src) 98.2 F (36.8 C) (Oral)  Wt 137 lb 8 oz (62.37 kg)  SpO2  97%  Wt Readings from Last 3 Encounters:  12/21/14 137 lb 8 oz (62.37 kg)  11/29/14 133 lb 6 oz (60.499 kg)  11/03/14 141 lb 8 oz (64.184 kg)    Physical Exam  Constitutional: She is oriented to person, place, and time. She appears well-developed and well-nourished. No distress.  HENT:  Head: Normocephalic and atraumatic.  Eyes: Conjunctivae are normal.  Neck: Normal range of motion. Neck supple.  Cardiovascular: Normal rate.   Pulmonary/Chest: Effort normal.  Abdominal: Soft. Bowel sounds are normal. She exhibits no distension. There is no tenderness.  Musculoskeletal: She exhibits no edema.  Neurological: She is alert and oriented to person, place, and time. No cranial nerve deficit.  Skin: Skin is warm and dry.  Psychiatric: She has a normal mood and affect. Her behavior is normal. Judgment and thought content normal.  Nursing note and vitals reviewed.         Assessment & Plan:   Diarrhea No Follow-up on file.

## 2014-12-21 NOTE — Assessment & Plan Note (Signed)
>  25 minutes spent in face to face time with patient, >50% spent in counselling or coordination of care Improving- notes and studies reviewed with pt today. She will let me and GI know if symptoms return. The patient indicates understanding of these issues and agrees with the plan.

## 2014-12-21 NOTE — Progress Notes (Signed)
Pre visit review using our clinic review tool, if applicable. No additional management support is needed unless otherwise documented below in the visit note. 

## 2015-01-01 DIAGNOSIS — H2512 Age-related nuclear cataract, left eye: Secondary | ICD-10-CM | POA: Diagnosis not present

## 2015-01-01 LAB — HM DIABETES EYE EXAM

## 2015-01-02 DIAGNOSIS — E1149 Type 2 diabetes mellitus with other diabetic neurological complication: Secondary | ICD-10-CM | POA: Diagnosis not present

## 2015-01-03 ENCOUNTER — Encounter: Payer: Self-pay | Admitting: Gastroenterology

## 2015-01-03 ENCOUNTER — Ambulatory Visit (INDEPENDENT_AMBULATORY_CARE_PROVIDER_SITE_OTHER): Payer: Medicare Other | Admitting: Gastroenterology

## 2015-01-03 VITALS — BP 142/98 | HR 76 | Ht 65.5 in | Wt 133.2 lb

## 2015-01-03 DIAGNOSIS — I2510 Atherosclerotic heart disease of native coronary artery without angina pectoris: Secondary | ICD-10-CM

## 2015-01-03 DIAGNOSIS — G629 Polyneuropathy, unspecified: Secondary | ICD-10-CM | POA: Diagnosis not present

## 2015-01-03 DIAGNOSIS — R197 Diarrhea, unspecified: Secondary | ICD-10-CM

## 2015-01-03 DIAGNOSIS — F17200 Nicotine dependence, unspecified, uncomplicated: Secondary | ICD-10-CM | POA: Diagnosis not present

## 2015-01-03 DIAGNOSIS — E1149 Type 2 diabetes mellitus with other diabetic neurological complication: Secondary | ICD-10-CM | POA: Diagnosis not present

## 2015-01-03 MED ORDER — VANCOMYCIN HCL 250 MG PO CAPS: ORAL_CAPSULE | ORAL | Status: DC

## 2015-01-03 NOTE — Patient Instructions (Signed)
Go to the basement for your lab kit today  Your follow up appointment with Dr Deatra Ina is scheduled on 03/12/2015 at 8:45am

## 2015-01-03 NOTE — Progress Notes (Signed)
      History of Present Illness:  Sarah Payne is complaining of recurrent diarrhea.  On her 2 week course of vancomycin she was symptom-free.  She remained so for 2 weeks.  Beginning 4 days ago she developed diarrhea 3-4 times a day.  It is accompanied by urgency.  Symptoms are similar to what she has had in the past.    Review of Systems: Pertinent positive and negative review of systems were noted in the above HPI section. All other review of systems were otherwise negative.    Current Medications, Allergies, Past Medical History, Past Surgical History, Family History and Social History were reviewed in [place] record  Vital signs were reviewed in today's medical record. Physical Exam: General: Well developed , well nourished, no acute distress   See Assessment and Plan under Problem List

## 2015-01-03 NOTE — Assessment & Plan Note (Signed)
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  While she tested negative in the past she clearly has responded to vancomycin.  Postinfectious diarrhea is less likely.  Recommendations #1 repeat stool for C. difficile toxin, stool lactoferrin #2 tapering course of vancomycin over the next month #3 to consider fecal transplantation if she yet again has recurrent diarrhea and we can demonstrate that she is positive for C. difficile toxin

## 2015-01-04 LAB — HEMOGLOBIN A1C: Hgb A1c MFr Bld: 8.6 % — AB (ref 4.0–6.0)

## 2015-01-10 ENCOUNTER — Telehealth: Payer: Self-pay

## 2015-01-10 NOTE — Telephone Encounter (Signed)
Diabetic Bundle. Left voicemail advising pt her A1C blood test is due. Pt advised to contact PCP's office to schedule.  

## 2015-01-16 ENCOUNTER — Telehealth: Payer: Self-pay | Admitting: Family Medicine

## 2015-01-16 NOTE — Telephone Encounter (Signed)
Pt called to let us know that her diabetic doctor is taking care of her diabetes, if you have any questions you cn call pt at home number listed, thanks.

## 2015-01-18 ENCOUNTER — Telehealth: Payer: Self-pay | Admitting: Gastroenterology

## 2015-01-18 NOTE — Telephone Encounter (Signed)
Patient will follow this plan.

## 2015-01-18 NOTE — Telephone Encounter (Signed)
She is a little better. She is still on Vancomycin. She is having semi formed stools about 2 to 3 a day. Her main complaint is nausea without vomiting that is causing anorexia. She is low on energy, wanting to sleep all day, a little headache and a little low back ache. She has Zofran 4mg . She starts out the day feeling nauseated. We discussed hydration and bland foods, small amounts, etc. Could sh start out the day with a Zofran to ward off the nausea, then she maybe wouldn't get dehydrated and weak through the day. Please advise.

## 2015-01-18 NOTE — Telephone Encounter (Signed)
Yes

## 2015-01-19 DIAGNOSIS — Z9841 Cataract extraction status, right eye: Secondary | ICD-10-CM

## 2015-01-19 DIAGNOSIS — H269 Unspecified cataract: Secondary | ICD-10-CM

## 2015-01-19 HISTORY — DX: Cataract extraction status, left eye: Z98.41

## 2015-01-19 HISTORY — DX: Unspecified cataract: H26.9

## 2015-01-23 ENCOUNTER — Encounter
Admission: RE | Admit: 2015-01-23 | Discharge: 2015-01-23 | Disposition: A | Discharge status: Home / Self Care | Payer: Medicare Other | Source: Ambulatory Visit | Attending: Ophthalmology | Admitting: Ophthalmology

## 2015-01-23 DIAGNOSIS — I10 Essential (primary) hypertension: Secondary | ICD-10-CM | POA: Diagnosis not present

## 2015-01-23 DIAGNOSIS — H2512 Age-related nuclear cataract, left eye: Secondary | ICD-10-CM | POA: Diagnosis not present

## 2015-01-23 DIAGNOSIS — Z01810 Encounter for preprocedural cardiovascular examination: Secondary | ICD-10-CM | POA: Insufficient documentation

## 2015-01-24 ENCOUNTER — Other Ambulatory Visit: Payer: Medicare Other

## 2015-01-24 ENCOUNTER — Telehealth: Payer: Self-pay | Admitting: *Deleted

## 2015-01-24 NOTE — Telephone Encounter (Signed)
Lm on pts vm regarding scheduling mammogram

## 2015-01-30 ENCOUNTER — Encounter: Payer: Self-pay | Admitting: *Deleted

## 2015-01-30 ENCOUNTER — Ambulatory Visit: Payer: Medicare Other | Admitting: Anesthesiology

## 2015-01-30 ENCOUNTER — Ambulatory Visit
Admission: RE | Admit: 2015-01-30 | Discharge: 2015-01-30 | Disposition: A | Discharge status: Home / Self Care | Payer: Medicare Other | Source: Ambulatory Visit | Attending: Ophthalmology | Admitting: Ophthalmology

## 2015-01-30 ENCOUNTER — Encounter
Admission: RE | Disposition: A | Discharge status: Home / Self Care | Payer: Self-pay | Source: Ambulatory Visit | Attending: Ophthalmology

## 2015-01-30 DIAGNOSIS — G4730 Sleep apnea, unspecified: Secondary | ICD-10-CM | POA: Diagnosis not present

## 2015-01-30 DIAGNOSIS — F17200 Nicotine dependence, unspecified, uncomplicated: Secondary | ICD-10-CM | POA: Insufficient documentation

## 2015-01-30 DIAGNOSIS — I2510 Atherosclerotic heart disease of native coronary artery without angina pectoris: Secondary | ICD-10-CM | POA: Insufficient documentation

## 2015-01-30 DIAGNOSIS — I252 Old myocardial infarction: Secondary | ICD-10-CM | POA: Diagnosis not present

## 2015-01-30 DIAGNOSIS — M1990 Unspecified osteoarthritis, unspecified site: Secondary | ICD-10-CM | POA: Insufficient documentation

## 2015-01-30 DIAGNOSIS — I739 Peripheral vascular disease, unspecified: Secondary | ICD-10-CM | POA: Insufficient documentation

## 2015-01-30 DIAGNOSIS — Z955 Presence of coronary angioplasty implant and graft: Secondary | ICD-10-CM | POA: Insufficient documentation

## 2015-01-30 DIAGNOSIS — R51 Headache: Secondary | ICD-10-CM | POA: Insufficient documentation

## 2015-01-30 DIAGNOSIS — F329 Major depressive disorder, single episode, unspecified: Secondary | ICD-10-CM | POA: Diagnosis not present

## 2015-01-30 DIAGNOSIS — I10 Essential (primary) hypertension: Secondary | ICD-10-CM | POA: Diagnosis not present

## 2015-01-30 DIAGNOSIS — E780 Pure hypercholesterolemia: Secondary | ICD-10-CM | POA: Diagnosis not present

## 2015-01-30 DIAGNOSIS — I509 Heart failure, unspecified: Secondary | ICD-10-CM | POA: Diagnosis not present

## 2015-01-30 DIAGNOSIS — E119 Type 2 diabetes mellitus without complications: Secondary | ICD-10-CM | POA: Insufficient documentation

## 2015-01-30 DIAGNOSIS — J449 Chronic obstructive pulmonary disease, unspecified: Secondary | ICD-10-CM | POA: Insufficient documentation

## 2015-01-30 DIAGNOSIS — H2512 Age-related nuclear cataract, left eye: Secondary | ICD-10-CM | POA: Insufficient documentation

## 2015-01-30 HISTORY — PX: CATARACT EXTRACTION W/PHACO: SHX586

## 2015-01-30 LAB — GLUCOSE, CAPILLARY: Glucose-Capillary: 146 mg/dL — ABNORMAL HIGH (ref 65–99)

## 2015-01-30 SURGERY — PHACOEMULSIFICATION, CATARACT, WITH IOL INSERTION
Anesthesia: Monitor Anesthesia Care | Site: Eye | Laterality: Left | Wound class: Clean

## 2015-01-30 MED ORDER — EPINEPHRINE HCL 1 MG/ML IJ SOLN
INTRAMUSCULAR | Status: AC
  Filled 2015-01-30: qty 1

## 2015-01-30 MED ORDER — EPINEPHRINE HCL 1 MG/ML IJ SOLN
INTRAMUSCULAR | Status: DC | PRN
  Administered 2015-01-30: 200 mL

## 2015-01-30 MED ORDER — TETRACAINE HCL 0.5 % OP SOLN
1.0000 [drp] | OPHTHALMIC | Status: DC | PRN
  Administered 2015-01-30: 1 [drp] via OPHTHALMIC

## 2015-01-30 MED ORDER — SODIUM CHLORIDE 0.9 % IV SOLN
INTRAVENOUS | Status: DC
  Administered 2015-01-30 (×2): via INTRAVENOUS

## 2015-01-30 MED ORDER — TETRACAINE HCL 0.5 % OP SOLN
OPHTHALMIC | Status: AC
  Administered 2015-01-30: 1 [drp] via OPHTHALMIC
  Filled 2015-01-30: qty 2

## 2015-01-30 MED ORDER — POVIDONE-IODINE 5 % OP SOLN
OPHTHALMIC | Status: AC
  Administered 2015-01-30: 1 via OPHTHALMIC
  Filled 2015-01-30: qty 30

## 2015-01-30 MED ORDER — MOXIFLOXACIN HCL 0.5 % OP SOLN: 1.0000 [drp] | OPHTHALMIC | Status: DC | PRN

## 2015-01-30 MED ORDER — CEFUROXIME OPHTHALMIC INJECTION 1 MG/0.1 ML
INJECTION | OPHTHALMIC | Status: AC
  Filled 2015-01-30: qty 0.1

## 2015-01-30 MED ORDER — MOXIFLOXACIN HCL 0.5 % OP SOLN
OPHTHALMIC | Status: DC | PRN
Start: 2015-01-30 — End: 2015-01-30
  Administered 2015-01-30: 1 [drp] via OPHTHALMIC

## 2015-01-30 MED ORDER — MOXIFLOXACIN HCL 0.5 % OP SOLN
OPHTHALMIC | Status: AC
  Filled 2015-01-30: qty 3

## 2015-01-30 MED ORDER — CARBACHOL 0.01 % IO SOLN
INTRAOCULAR | Status: DC | PRN
  Administered 2015-01-30: 0.5 mL via INTRAOCULAR

## 2015-01-30 MED ORDER — ARMC OPHTHALMIC DILATING GEL
OPHTHALMIC | Status: AC
  Administered 2015-01-30: 1 via OPHTHALMIC
  Filled 2015-01-30: qty 0.25

## 2015-01-30 MED ORDER — NA CHONDROIT SULF-NA HYALURON 40-17 MG/ML IO SOLN
INTRAOCULAR | Status: AC
  Filled 2015-01-30: qty 1

## 2015-01-30 MED ORDER — POVIDONE-IODINE 5 % OP SOLN
1.0000 "application " | OPHTHALMIC | Status: DC | PRN
  Administered 2015-01-30: 1 via OPHTHALMIC

## 2015-01-30 MED ORDER — MIDAZOLAM HCL 2 MG/2ML IJ SOLN
INTRAMUSCULAR | Status: DC | PRN
  Administered 2015-01-30: 1 mg via INTRAVENOUS

## 2015-01-30 MED ORDER — ARMC OPHTHALMIC DILATING GEL
1.0000 "application " | OPHTHALMIC | Status: DC | PRN
  Administered 2015-01-30: 1 via OPHTHALMIC

## 2015-01-30 MED ORDER — CEFUROXIME OPHTHALMIC INJECTION 1 MG/0.1 ML
INJECTION | OPHTHALMIC | Status: DC | PRN
  Administered 2015-01-30: 0.1 mL via INTRACAMERAL

## 2015-01-30 MED ORDER — FENTANYL CITRATE (PF) 100 MCG/2ML IJ SOLN
INTRAMUSCULAR | Status: DC | PRN
  Administered 2015-01-30: 50 ug via INTRAVENOUS

## 2015-01-30 SURGICAL SUPPLY — 20 items
CANNULA ANT/CHMB 27GA (MISCELLANEOUS) ×2 IMPLANT
GLOVE BIO SURGEON STRL SZ8 (GLOVE) ×2 IMPLANT
GLOVE BIOGEL M 6.5 STRL (GLOVE) ×2 IMPLANT
GLOVE SURG LX 8.0 MICRO (GLOVE) ×1
GLOVE SURG LX STRL 8.0 MICRO (GLOVE) ×1 IMPLANT
GOWN STRL REUS W/ TWL LRG LVL3 (GOWN DISPOSABLE) ×2 IMPLANT
GOWN STRL REUS W/TWL LRG LVL3 (GOWN DISPOSABLE) ×2
LENS IOL TECNIS 22.0 (Intraocular Lens) ×2 IMPLANT
LENS IOL TECNIS MONO 1P 22.0 (Intraocular Lens) ×1 IMPLANT
PACK CATARACT (MISCELLANEOUS) ×2 IMPLANT
PACK CATARACT BRASINGTON LX (MISCELLANEOUS) ×2 IMPLANT
PACK EYE AFTER SURG (MISCELLANEOUS) ×2 IMPLANT
SOL BSS BAG (MISCELLANEOUS) ×2
SOL PREP PVP 2OZ (MISCELLANEOUS) ×2
SOLUTION BSS BAG (MISCELLANEOUS) ×1 IMPLANT
SOLUTION PREP PVP 2OZ (MISCELLANEOUS) ×1 IMPLANT
SYR 5ML LL (SYRINGE) ×2 IMPLANT
SYR TB 1ML 27GX1/2 LL (SYRINGE) ×2 IMPLANT
WATER STERILE IRR 1000ML POUR (IV SOLUTION) ×2 IMPLANT
WIPE NON LINTING 3.25X3.25 (MISCELLANEOUS) ×2 IMPLANT

## 2015-01-30 NOTE — Op Note (Signed)
PREOPERATIVE DIAGNOSIS:  Nuclear sclerotic cataract of the left eye.   POSTOPERATIVE DIAGNOSIS:  nuclear sclerotic cataract left eye   OPERATIVE PROCEDURE:  Procedure(s): CATARACT EXTRACTION PHACO AND INTRAOCULAR LENS PLACEMENT (IOC)   SURGEON:  Birder Robson, MD.   ANESTHESIA:   Anesthesiologist: Gunnar Bulla, MD CRNA: Nelda Marseille, CRNA  1.      Managed anesthesia care. 2.      Topical tetracaine drops followed by 2% Xylocaine jelly applied in the preoperative holding area.   COMPLICATIONS:  None.   TECHNIQUE:   Stop and chop   DESCRIPTION OF PROCEDURE:  The patient was examined and consented in the preoperative holding area where the aforementioned topical anesthesia was applied to the left eye and then brought back to the Operating Room where the left eye was prepped and draped in the usual sterile ophthalmic fashion and a lid speculum was placed. A paracentesis was created with the side port blade and the anterior chamber was filled with viscoelastic. A near clear corneal incision was performed with the steel keratome. A continuous curvilinear capsulorrhexis was performed with a cystotome followed by the capsulorrhexis forceps. Hydrodissection and hydrodelineation were carried out with BSS on a blunt cannula. The lens was removed in a stop and chop  technique and the remaining cortical material was removed with the irrigation-aspiration handpiece. The capsular bag was inflated with viscoelastic and the Technis ZCB00 lens was placed in the capsular bag without complication. The remaining viscoelastic was removed from the eye with the irrigation-aspiration handpiece. The wounds were hydrated. The anterior chamber was flushed with Miostat and the eye was inflated to physiologic pressure. 0.1 mL of cefuroxime concentration 10 mg/mL was placed in the anterior chamber. The wounds were found to be water tight. The eye was dressed with Vigamox. The patient was given protective glasses to wear  throughout the day and a shield with which to sleep tonight. The patient was also given drops with which to begin a drop regimen today and will follow-up with me in one day.  Implant Name Type Inv. Item Serial No. Manufacturer Lot No. LRB No. Used  LENS IMPL INTRAOC ZCB00 22.0 - [id number] Intraocular Lens LENS IMPL INTRAOC ZCB00 22.0 [id number] AMO  Left 1  LENS IMPL INTRAOC ZCB00 22.0 - [id number] Intraocular Lens LENS IMPL INTRAOC ZCB00 22.0 [id number] AMO   Left 1   Procedure(s) with comments: CATARACT EXTRACTION PHACO AND INTRAOCULAR LENS PLACEMENT (IOC) (Left) - [place] 00:47 AP% 19.3 CDE 9.26 fluid pack lot #[id number]  Electronically signed: Hay[place] 01/30/2015 12:26 PM

## 2015-01-30 NOTE — Anesthesia Procedure Notes (Signed)
Procedure Name: MAC Date/Time: 01/30/2015 12:08 PM Performed by: Nelda Marseille Pre-anesthesia Checklist: Patient identified, Emergency Drugs available, Suction available, Patient being monitored and Timeout performed Oxygen Delivery Method: Nasal cannula

## 2015-01-30 NOTE — Anesthesia Preprocedure Evaluation (Signed)
Anesthesia Evaluation    Airway Mallampati: II  TM Distance: >3 FB     Dental  (+) Upper Dentures, Lower Dentures   Pulmonary sleep apnea , COPDCurrent Smoker,          Cardiovascular hypertension, + angina + CAD, + Peripheral Vascular Disease and +CHF     Neuro/Psych  Headaches, PSYCHIATRIC DISORDERS Depression    GI/Hepatic   Endo/Other  diabetesHypothyroidism   Renal/GU      Musculoskeletal  (+) Arthritis -,   Abdominal   Peds  Hematology   Anesthesia Other Findings   Reproductive/Obstetrics                             Anesthesia Physical Anesthesia Plan  ASA: II  Anesthesia Plan: MAC   Post-op Pain Management:    Induction:   Airway Management Planned: Nasal Cannula  Additional Equipment:   Intra-op Plan:   Post-operative Plan:   Informed Consent: I have reviewed the patients History and Physical, chart, labs and discussed the procedure including the risks, benefits and alternatives for the proposed anesthesia with the patient or authorized representative who has indicated his/her understanding and acceptance.     Plan Discussed with: CRNA  Anesthesia Plan Comments:         Anesthesia Quick Evaluation

## 2015-01-30 NOTE — H&P (Signed)
  All labs reviewed. Abnormal studies sent to patients PCP when indicated.  Previous H&P reviewed, patient examined, there are NO CHANGES.  Sarah Payne LOUIS7/12/201611:55 AM

## 2015-01-30 NOTE — Discharge Instructions (Signed)

## 2015-01-30 NOTE — Transfer of Care (Signed)
Immediate Anesthesia Transfer of Care Note  Patient: Sarah Payne  Procedure(s) Performed: Procedure(s) with comments: CATARACT EXTRACTION PHACO AND INTRAOCULAR LENS PLACEMENT (IOC) (Left) - [place] 00:47 AP% 19.3 CDE 9.26 fluid pack lot #[id number]  Patient Location: PACU  Anesthesia Type:MAC  Level of Consciousness: awake, alert  and oriented  Airway & Oxygen Therapy: Patient Spontanous Breathing  Post-op Assessment: Report given to RN and Post -op Vital signs reviewed and stable  Post vital signs: Reviewed and stable  Last Vitals:  Filed Vitals:   01/30/15 1112  BP: 155/74  Pulse: 60  Temp: 36.7 C  Resp: 14    Complications: No apparent anesthesia complications

## 2015-01-30 NOTE — Anesthesia Postprocedure Evaluation (Signed)
  Anesthesia Post-op Note  Patient: Sarah Payne  Procedure(s) Performed: Procedure(s) with comments: CATARACT EXTRACTION PHACO AND INTRAOCULAR LENS PLACEMENT (IOC) (Left) - [place] 00:47 AP% 19.3 CDE 9.26 fluid pack lot #[id number]  Anesthesia type:MAC  Patient location: PACU  Post pain: Pain level controlled  Post assessment: Post-op Vital signs reviewed, Patient's Cardiovascular Status Stable, Respiratory Function Stable, Patent Airway and No signs of Nausea or vomiting  Post vital signs: Reviewed and stable  Last Vitals:  Filed Vitals:   01/30/15 1112  BP: 155/74  Pulse: 60  Temp: 36.7 C  Resp: 14    Level of consciousness: awake, alert  and patient cooperative  Complications: No apparent anesthesia complications

## 2015-02-01 ENCOUNTER — Ambulatory Visit (INDEPENDENT_AMBULATORY_CARE_PROVIDER_SITE_OTHER): Payer: Medicare Other | Admitting: Cardiovascular Disease

## 2015-02-01 ENCOUNTER — Encounter: Payer: Self-pay | Admitting: Cardiovascular Disease

## 2015-02-01 VITALS — BP 136/84 | HR 70 | Wt 136.0 lb

## 2015-02-01 DIAGNOSIS — J449 Chronic obstructive pulmonary disease, unspecified: Secondary | ICD-10-CM

## 2015-02-01 DIAGNOSIS — E1151 Type 2 diabetes mellitus with diabetic peripheral angiopathy without gangrene: Secondary | ICD-10-CM

## 2015-02-01 DIAGNOSIS — I2510 Atherosclerotic heart disease of native coronary artery without angina pectoris: Secondary | ICD-10-CM | POA: Diagnosis not present

## 2015-02-01 DIAGNOSIS — E785 Hyperlipidemia, unspecified: Secondary | ICD-10-CM

## 2015-02-01 DIAGNOSIS — I5022 Chronic systolic (congestive) heart failure: Secondary | ICD-10-CM | POA: Diagnosis not present

## 2015-02-01 DIAGNOSIS — E1159 Type 2 diabetes mellitus with other circulatory complications: Secondary | ICD-10-CM

## 2015-02-01 DIAGNOSIS — Z720 Tobacco use: Secondary | ICD-10-CM

## 2015-02-01 DIAGNOSIS — I739 Peripheral vascular disease, unspecified: Secondary | ICD-10-CM

## 2015-02-01 NOTE — Assessment & Plan Note (Signed)
Improved hemoglobin A1c, managed by endocrinology On insulin twice a day

## 2015-02-01 NOTE — Assessment & Plan Note (Signed)
We have encouraged her to continue to work on weaning her cigarettes and smoking cessation. She will continue to work on this and does not want any assistance with chantix.  

## 2015-02-01 NOTE — Assessment & Plan Note (Signed)
Hemoglobin A1c has improved, recommended she stay on her Lipitor 40 mg daily. Goal LDL less than 70. We'll recheck on her next visit

## 2015-02-01 NOTE — Patient Instructions (Signed)
You are doing well. No medication changes were made.  Please call us if you have new issues that need to be addressed before your next appt.  Your physician wants you to follow-up in: 6 months.  You will receive a reminder letter in the mail two months in advance. If you don't receive a letter, please call our office to schedule the follow-up appointment.   

## 2015-02-01 NOTE — Assessment & Plan Note (Signed)
Currently with no symptoms of angina. No further workup at this time. Continue current medication regimen. 

## 2015-02-01 NOTE — Assessment & Plan Note (Signed)
She is taking very few inhalers. She reports she does not need them Still continues to smoke

## 2015-02-01 NOTE — Assessment & Plan Note (Signed)
Euvolemic on today's visit. We will continue her Coreg, enalapril, isosorbide

## 2015-02-01 NOTE — Progress Notes (Signed)
Patient ID: Sarah Payne, female    DOB: Apr 14, 1946, 69 y.o.   MRN: [id number]  HPI Comments: 69 year old woman with a long history of smoking, coronary artery disease, prior stent x3 placed to the mid left circumflex with moderate to severe LAD disease , stent placed to her distal RCA in February 2013, stent to the mid left circumflex in March 2013 , poorly controlled diabetes with hemoglobin A1c of 10, depression, hypertension, cardiac catheterization 08/19/2012 showing moderate mid LAD disease at the takeoff of the diagonal vessel, severe ostial to mid RCA disease, ejection fraction greater than 55%. She had FFR pressure wire showing severe disease of the RCA with drug-eluting stent x2 placed. Recent C. difficile late 2015 after antibiotics Previous history of running out of her medications She presents today for follow-up of her coronary artery disease  In follow-up today, she reports that she is doing well She reports that her diarrhea has resolved after taking vancomycin She has lost weight, hemoglobin A1c has improved down to 8.7. Managed by endocrine at [facility]. She is taking insulin twice a day as well as other meds. Metformin held for diarrhea She denies any symptoms concerning for angina Compliant with her Lipitor Still smoking daily, trying to quit  EKG not performed on today's visit  Other past medical history  in the hospital January 12, discharged 08/02/2014. Diagnosed with unstable angina. Had cardiac catheterization showing severe stable coronary disease, Echocardiogram with normal ejection fraction Total cholesterol 292  Since her discharge, she denies any further chest pain. She reports that she is taking her medications. Recently been to diabetes instruction classes, scheduled to start insulin. She has been compliant with her statin per the patient She has developed a cough since her discharge from the hospital. She does not want antibiotics given prior history of C.  Difficile at the end of 2015  Details of the echocardiogram 08/01/2014; ejection fraction 55-60%, normal right ventricular systolic pressure, normal right heart systolic function Catheterization details, unchanged from prior catheterization. Lab report has not been signed off in the computer. Catheterization by Dr. Ellyn Hack. Medical management was recommended  Previous Lab work showing hemoglobin A1c 8.8, total cholesterol 287, LDL 174  Echocardiogram February 07, 2011 shows normal LV systolic function, diastolic dysfunction, moderate inferior wall hypokinesis, normal right ventricular systolic pressures.  Allergies  Allergen Reactions  . No Known Allergies     Outpatient Encounter Prescriptions as of 02/01/2015  Medication Sig  . albuterol (PROVENTIL HFA;VENTOLIN HFA) 108 (90 BASE) MCG/ACT inhaler Inhale 2 puffs into the lungs every 6 (six) hours as needed for wheezing. (Patient not taking: Reported on 01/30/2015)  . aspirin 81 MG tablet Take 81 mg by mouth daily.    Marland Kitchen atorvastatin (LIPITOR) 40 MG tablet Take 1 tablet (40 mg total) by mouth daily.  . Calcium & Magnesium Carbonates (MYLANTA PO) Take by mouth.    . carvedilol (COREG) 3.125 MG tablet Take 2 tablets (6.25 mg total) by mouth 2 (two) times daily.  . cholestyramine light (PREVALITE) 4 G packet Take 1 packet (4 g total) by mouth 2 (two) times daily. (Patient not taking: Reported on 01/30/2015)  . clonazePAM (KLONOPIN) 0.5 MG tablet Take 1 tablet (0.5 mg total) by mouth at bedtime as needed for anxiety.  . clopidogrel (PLAVIX) 75 MG tablet TAKE 1 TABLET (75 MG TOTAL) BY MOUTH DAILY.  . cyanocobalamin (,VITAMIN B-12,) 1000 MCG/ML injection Inject 1 mL (1,000 mcg total) into the muscle every 30 (thirty) days. INJECT 1 ML (  1,000 MCG TOTAL) INTO THE MUSCLE ONCE.  Marland Kitchen donepezil (ARICEPT) 10 MG tablet Take by mouth.  . DULoxetine (CYMBALTA) 60 MG capsule TAKE 1 CAPSULE BY MOUTH DAILY.  Marland Kitchen enalapril (VASOTEC) 5 MG tablet Take 1 tablet (5 mg total)  by mouth daily.  Marland Kitchen FLORA-Q (FLORA-Q) CAPS capsule Take 1 capsule by mouth daily.  Marland Kitchen glipiZIDE (GLUCOTROL) 10 MG tablet Before breakfast and supper (evening meal)  . glucose blood (FREESTYLE LITE) test strip Check blood sugar 3 times daily to check blood sugars - Uncontrolled diabetes mellitus  . glucose monitoring kit (FREESTYLE) monitoring kit 1 each by Does not apply route as needed for other. PHARMACY PLEASE SUBSTITUTE WHATEVER GLUCOMETER YOU CARRY  . insulin glargine (LANTUS) 100 UNIT/ML injection 15 units qhs  . isosorbide mononitrate (IMDUR) 30 MG 24 hr tablet Take 1 tablet (30 mg total) by mouth daily.  . lansoprazole (PREVACID) 15 MG capsule Take by mouth.  . levothyroxine (SYNTHROID) 175 MCG tablet Take 1 tablet (175 mcg total) by mouth daily before breakfast.  . Levothyroxine Sodium 150 MCG CAPS Take 1 capsule (150 mcg total) by mouth daily before breakfast. (Patient not taking: Reported on 01/30/2015)  . magnesium 30 MG tablet Take by mouth.  . metFORMIN (GLUCOPHAGE) 500 MG tablet Take 2 tablets (1,000 mg total) by mouth 2 (two) times daily with a meal. (Patient not taking: Reported on 01/30/2015)  . nitroGLYCERIN (NITROSTAT) 0.4 MG SL tablet Place 1 tablet (0.4 mg total) under the tongue every 5 (five) minutes as needed.  Marland Kitchen omeprazole (PRILOSEC) 20 MG capsule Take 1 capsule (20 mg total) by mouth 2 (two) times daily before a meal.  . ondansetron (ZOFRAN) 4 MG tablet Take 1 tab every 6 hours as needed for nausea.  . promethazine (PHENERGAN) 12.5 MG tablet Take 1 tablet (12.5 mg total) by mouth every 8 (eight) hours as needed for nausea or vomiting.  . sitaGLIPtin (JANUVIA) 100 MG tablet Take 1 tablet (100 mg total) by mouth daily.  . traZODone (DESYREL) 100 MG tablet TAKE 1 TABLET (100 MG TOTAL) BY MOUTH AT BEDTIME.  . [DISCONTINUED] alum & mag hydroxide-simeth (MAALOX/MYLANTA) 200-200-20 MG/5ML suspension Take by mouth.  . [DISCONTINUED] diazepam (VALIUM) 2 MG tablet Take 1 tablet (2 mg  total) by mouth every 8 (eight) hours as needed for muscle spasms. (Patient not taking: Reported on 01/30/2015)  . [DISCONTINUED] escitalopram (LEXAPRO) 10 MG tablet Take 1 tablet (10 mg total) by mouth at bedtime.  . [DISCONTINUED] fluticasone (FLOVENT HFA) 220 MCG/ACT inhaler Inhale 1 puff into the lungs 2 (two) times daily. (Patient not taking: Reported on 01/30/2015)  . [DISCONTINUED] saccharomyces boulardii (FLORASTOR) 250 MG capsule Take 1 capsule (250 mg total) by mouth 2 (two) times daily. (Patient not taking: Reported on 01/30/2015)  . [DISCONTINUED] vancomycin (VANCOCIN) 250 MG capsule Use 1 cap  four times a day for 1 week Use 1 cap three times a day for 1 week Use 1 cap two times a day for 1 week Use 1 cap a day for one week Then Discontinue (Patient not taking: Reported on 01/30/2015)   No facility-administered encounter medications on file as of 02/01/2015.    Past Medical History  Diagnosis Date  . Diabetes mellitus   . Hypertension   . Hypothyroidism   . Depression   . Vascular dementia   . Tobacco abuse     a. 45 pack year history - quit 09/16/11  . Chronic low back pain   . Coronary artery disease  a. s/p PCI/DES LCX x 3;  b. NSTEMI 09/16/11;  c.   09/17/11 Cath [facility]) Severe RCA/LCX dzs - RCA treated, LCX intervention pending w/ Dr. Clayborn Bigness  . Sleep apnea     mild-does not use cpap  . Impingement syndrome of left shoulder   . Tendonitis of left rotator cuff   . Diverticulosis     Past Surgical History  Procedure Laterality Date  . Back surgery    . Abdominal hysterectomy    . Appendectomy    . Cardiac catheterization  2013  . Coronary angioplasty  2012    stent x 3   . Coronary angioplasty with stent placement  2013  . Cardiac catheterization  2016  . Abdominal hysterectomy    . Colonoscopy N/A 11/02/2014    Procedure: COLONOSCOPY;  Surgeon: Inda Castle, MD;  Location: [facility];  Service: Endoscopy;  Laterality: N/A;  . Cataract extraction w/phaco  Left 01/30/2015    Procedure: CATARACT EXTRACTION PHACO AND INTRAOCULAR LENS PLACEMENT (IOC);  Surgeon: Birder Robson, MD;  Location: [facility];  Service: Ophthalmology;  Laterality: Left;  [place] 00:47 AP% 19.3 CDE 9.26 fluid pack lot #[id number]    Social History  reports that she has been smoking Cigarettes.  She has a 11.25 pack-year smoking history. She has never used smokeless tobacco. She reports that she uses illicit drugs (Marijuana). She reports that she does not drink alcohol.  Family History family history includes Colon cancer in her sister; Heart attack in her mother; Heart disease in her father and mother; Liver cancer in her brother; Throat cancer in her brother.   Review of Systems  Constitutional: Negative.   Respiratory: Negative.   Cardiovascular: Negative.   Gastrointestinal: Positive for nausea and diarrhea.  Endocrine: Negative.   Musculoskeletal: Negative.   Neurological: Negative.   Hematological: Negative.   Psychiatric/Behavioral: Negative.   All other systems reviewed and are negative.  BP 136/84 mmHg  Pulse 70  Wt 136 lb (61.689 kg)  Physical Exam  Constitutional: She is oriented to person, place, and time. She appears well-developed and well-nourished.  HENT:  Head: Normocephalic.  Nose: Nose normal.  Mouth/Throat: Oropharynx is clear and moist.  Eyes: Conjunctivae are normal. Pupils are equal, round, and reactive to light.  Neck: Normal range of motion. Neck supple. No JVD present.  Cardiovascular: Normal rate, regular rhythm, S1 normal, S2 normal, normal heart sounds and intact distal pulses.  Exam reveals no gallop and no friction rub.   No murmur heard. Pulmonary/Chest: Effort normal and breath sounds normal. No respiratory distress. She has no wheezes. She has no rales. She exhibits no tenderness.  Abdominal: Soft. Bowel sounds are normal. She exhibits no distension. There is no tenderness.  Musculoskeletal: Normal range of motion. She exhibits  no edema or tenderness.  Lymphadenopathy:    She has no cervical adenopathy.  Neurological: She is alert and oriented to person, place, and time. Coordination normal.  Skin: Skin is warm and dry. No rash noted. No erythema.  Psychiatric: She has a normal mood and affect. Her behavior is normal. Judgment and thought content normal.    Assessment and Plan  Nursing note and vitals reviewed.

## 2015-02-08 MED ORDER — FENTANYL CITRATE (PF) 100 MCG/2ML IJ SOLN
INTRAMUSCULAR | Status: AC
  Filled 2015-02-08: qty 2

## 2015-02-09 DIAGNOSIS — H2511 Age-related nuclear cataract, right eye: Secondary | ICD-10-CM | POA: Diagnosis not present

## 2015-02-12 ENCOUNTER — Encounter: Payer: Self-pay | Admitting: *Deleted

## 2015-02-12 DIAGNOSIS — I509 Heart failure, unspecified: Secondary | ICD-10-CM | POA: Diagnosis not present

## 2015-02-12 DIAGNOSIS — R51 Headache: Secondary | ICD-10-CM | POA: Diagnosis not present

## 2015-02-12 DIAGNOSIS — E780 Pure hypercholesterolemia: Secondary | ICD-10-CM | POA: Diagnosis not present

## 2015-02-12 DIAGNOSIS — K219 Gastro-esophageal reflux disease without esophagitis: Secondary | ICD-10-CM | POA: Diagnosis not present

## 2015-02-12 DIAGNOSIS — H2511 Age-related nuclear cataract, right eye: Secondary | ICD-10-CM | POA: Diagnosis not present

## 2015-02-12 DIAGNOSIS — I252 Old myocardial infarction: Secondary | ICD-10-CM | POA: Diagnosis not present

## 2015-02-12 DIAGNOSIS — G4730 Sleep apnea, unspecified: Secondary | ICD-10-CM | POA: Diagnosis not present

## 2015-02-12 DIAGNOSIS — J449 Chronic obstructive pulmonary disease, unspecified: Secondary | ICD-10-CM | POA: Diagnosis not present

## 2015-02-12 DIAGNOSIS — M1990 Unspecified osteoarthritis, unspecified site: Secondary | ICD-10-CM | POA: Diagnosis not present

## 2015-02-12 DIAGNOSIS — E119 Type 2 diabetes mellitus without complications: Secondary | ICD-10-CM | POA: Diagnosis not present

## 2015-02-12 DIAGNOSIS — Z955 Presence of coronary angioplasty implant and graft: Secondary | ICD-10-CM | POA: Diagnosis not present

## 2015-02-12 DIAGNOSIS — I10 Essential (primary) hypertension: Secondary | ICD-10-CM | POA: Diagnosis not present

## 2015-02-12 DIAGNOSIS — F17200 Nicotine dependence, unspecified, uncomplicated: Secondary | ICD-10-CM | POA: Diagnosis not present

## 2015-02-12 DIAGNOSIS — I739 Peripheral vascular disease, unspecified: Secondary | ICD-10-CM | POA: Diagnosis not present

## 2015-02-12 DIAGNOSIS — I2510 Atherosclerotic heart disease of native coronary artery without angina pectoris: Secondary | ICD-10-CM | POA: Diagnosis not present

## 2015-02-13 ENCOUNTER — Encounter: Payer: Self-pay | Admitting: *Deleted

## 2015-02-13 ENCOUNTER — Ambulatory Visit: Payer: Medicare Other | Admitting: Anesthesiology

## 2015-02-13 ENCOUNTER — Ambulatory Visit
Admission: RE | Admit: 2015-02-13 | Discharge: 2015-02-13 | Disposition: A | Discharge status: Home / Self Care | Payer: Medicare Other | Source: Ambulatory Visit | Attending: Ophthalmology | Admitting: Ophthalmology

## 2015-02-13 ENCOUNTER — Encounter
Admission: RE | Disposition: A | Discharge status: Home / Self Care | Payer: Self-pay | Source: Ambulatory Visit | Attending: Ophthalmology

## 2015-02-13 DIAGNOSIS — I252 Old myocardial infarction: Secondary | ICD-10-CM | POA: Insufficient documentation

## 2015-02-13 DIAGNOSIS — E119 Type 2 diabetes mellitus without complications: Secondary | ICD-10-CM | POA: Insufficient documentation

## 2015-02-13 DIAGNOSIS — M1990 Unspecified osteoarthritis, unspecified site: Secondary | ICD-10-CM | POA: Diagnosis not present

## 2015-02-13 DIAGNOSIS — I10 Essential (primary) hypertension: Secondary | ICD-10-CM | POA: Insufficient documentation

## 2015-02-13 DIAGNOSIS — I2510 Atherosclerotic heart disease of native coronary artery without angina pectoris: Secondary | ICD-10-CM | POA: Insufficient documentation

## 2015-02-13 DIAGNOSIS — K219 Gastro-esophageal reflux disease without esophagitis: Secondary | ICD-10-CM | POA: Insufficient documentation

## 2015-02-13 DIAGNOSIS — I509 Heart failure, unspecified: Secondary | ICD-10-CM | POA: Insufficient documentation

## 2015-02-13 DIAGNOSIS — F17200 Nicotine dependence, unspecified, uncomplicated: Secondary | ICD-10-CM | POA: Diagnosis not present

## 2015-02-13 DIAGNOSIS — E780 Pure hypercholesterolemia: Secondary | ICD-10-CM | POA: Insufficient documentation

## 2015-02-13 DIAGNOSIS — R51 Headache: Secondary | ICD-10-CM | POA: Insufficient documentation

## 2015-02-13 DIAGNOSIS — H2511 Age-related nuclear cataract, right eye: Secondary | ICD-10-CM | POA: Insufficient documentation

## 2015-02-13 DIAGNOSIS — I739 Peripheral vascular disease, unspecified: Secondary | ICD-10-CM | POA: Insufficient documentation

## 2015-02-13 DIAGNOSIS — J449 Chronic obstructive pulmonary disease, unspecified: Secondary | ICD-10-CM | POA: Insufficient documentation

## 2015-02-13 DIAGNOSIS — G4730 Sleep apnea, unspecified: Secondary | ICD-10-CM | POA: Insufficient documentation

## 2015-02-13 DIAGNOSIS — Z955 Presence of coronary angioplasty implant and graft: Secondary | ICD-10-CM | POA: Insufficient documentation

## 2015-02-13 HISTORY — DX: Acute myocardial infarction, unspecified: I21.9

## 2015-02-13 HISTORY — DX: Gastro-esophageal reflux disease without esophagitis: K21.9

## 2015-02-13 HISTORY — PX: CATARACT EXTRACTION W/PHACO: SHX586

## 2015-02-13 LAB — URINE DRUG SCREEN, QUALITATIVE (ARMC ONLY)
Amphetamines, Ur Screen: NOT DETECTED
Barbiturates, Ur Screen: NOT DETECTED
Benzodiazepine, Ur Scrn: NOT DETECTED
Cannabinoid 50 Ng, Ur ~~LOC~~: POSITIVE — AB
Cocaine Metabolite,Ur ~~LOC~~: NOT DETECTED
MDMA (Ecstasy)Ur Screen: NOT DETECTED
Methadone Scn, Ur: NOT DETECTED
Opiate, Ur Screen: NOT DETECTED
Phencyclidine (PCP) Ur S: NOT DETECTED
Tricyclic, Ur Screen: NOT DETECTED

## 2015-02-13 LAB — GLUCOSE, CAPILLARY: Glucose-Capillary: 190 mg/dL — ABNORMAL HIGH (ref 65–99)

## 2015-02-13 SURGERY — PHACOEMULSIFICATION, CATARACT, WITH IOL INSERTION
Anesthesia: Monitor Anesthesia Care | Laterality: Right

## 2015-02-13 MED ORDER — TETRACAINE HCL 0.5 % OP SOLN
OPHTHALMIC | Status: AC
  Administered 2015-02-13: 1 [drp] via OPHTHALMIC
  Filled 2015-02-13: qty 2

## 2015-02-13 MED ORDER — CARBACHOL 0.01 % IO SOLN
INTRAOCULAR | Status: DC | PRN
  Administered 2015-02-13: .5 mL via INTRAOCULAR

## 2015-02-13 MED ORDER — MOXIFLOXACIN HCL 0.5 % OP SOLN
OPHTHALMIC | Status: AC
  Filled 2015-02-13: qty 3

## 2015-02-13 MED ORDER — PHENYLEPHRINE HCL 10 % OP SOLN
3.0000 [drp] | OPHTHALMIC | Status: DC | PRN
  Administered 2015-02-13: 3 [drp] via OPHTHALMIC

## 2015-02-13 MED ORDER — TETRACAINE HCL 0.5 % OP SOLN
1.0000 [drp] | OPHTHALMIC | Status: DC | PRN
  Administered 2015-02-13: 1 [drp] via OPHTHALMIC

## 2015-02-13 MED ORDER — SODIUM CHLORIDE 0.9 % IV SOLN
INTRAVENOUS | Status: DC
  Administered 2015-02-13: 10:00:00 via INTRAVENOUS

## 2015-02-13 MED ORDER — MOXIFLOXACIN HCL 0.5 % OP SOLN
1.0000 [drp] | Freq: Once | OPHTHALMIC | Status: AC
  Administered 2015-02-13: 2 [drp] via OPHTHALMIC
  Filled 2015-02-13: qty 3

## 2015-02-13 MED ORDER — CEFUROXIME OPHTHALMIC INJECTION 1 MG/0.1 ML
INJECTION | OPHTHALMIC | Status: DC | PRN
  Administered 2015-02-13: .1 mL via INTRACAMERAL

## 2015-02-13 MED ORDER — POVIDONE-IODINE 5 % OP SOLN
1.0000 "application " | OPHTHALMIC | Status: DC | PRN
  Administered 2015-02-13: 1 via OPHTHALMIC

## 2015-02-13 MED ORDER — EPINEPHRINE HCL 1 MG/ML IJ SOLN
INTRAOCULAR | Status: DC | PRN
  Administered 2015-02-13: 250 mL

## 2015-02-13 MED ORDER — NA CHONDROIT SULF-NA HYALURON 40-17 MG/ML IO SOLN
INTRAOCULAR | Status: AC
  Filled 2015-02-13: qty 1

## 2015-02-13 MED ORDER — EPINEPHRINE HCL 1 MG/ML IJ SOLN
INTRAMUSCULAR | Status: AC
  Filled 2015-02-13: qty 1

## 2015-02-13 MED ORDER — CEFUROXIME OPHTHALMIC INJECTION 1 MG/0.1 ML
INJECTION | OPHTHALMIC | Status: AC
Start: 2015-02-13 — End: 2015-02-13
  Filled 2015-02-13: qty 0.1

## 2015-02-13 MED ORDER — PHENYLEPHRINE HCL 10 % OP SOLN
OPHTHALMIC | Status: AC
  Administered 2015-02-13: 10:00:00
  Filled 2015-02-13: qty 5

## 2015-02-13 MED ORDER — ARMC OPHTHALMIC DILATING GEL
1.0000 "application " | OPHTHALMIC | Status: DC | PRN
  Administered 2015-02-13 (×2): 1 via OPHTHALMIC

## 2015-02-13 MED ORDER — MIDAZOLAM HCL 2 MG/2ML IJ SOLN
INTRAMUSCULAR | Status: DC | PRN
  Administered 2015-02-13: 1 mg via INTRAVENOUS

## 2015-02-13 MED ORDER — FENTANYL CITRATE (PF) 100 MCG/2ML IJ SOLN
INTRAMUSCULAR | Status: DC | PRN
  Administered 2015-02-13: 50 ug via INTRAVENOUS

## 2015-02-13 MED ORDER — POVIDONE-IODINE 5 % OP SOLN
OPHTHALMIC | Status: AC
  Administered 2015-02-13: 1 via OPHTHALMIC
  Filled 2015-02-13: qty 30

## 2015-02-13 SURGICAL SUPPLY — 20 items
CANNULA ANT/CHMB 27GA (MISCELLANEOUS) ×2 IMPLANT
GLOVE BIO SURGEON STRL SZ8 (GLOVE) ×4 IMPLANT
GLOVE BIOGEL M 6.5 STRL (GLOVE) ×2 IMPLANT
GLOVE SURG LX 8.0 MICRO (GLOVE) ×1
GLOVE SURG LX STRL 8.0 MICRO (GLOVE) ×1 IMPLANT
GOWN STRL REUS W/ TWL LRG LVL3 (GOWN DISPOSABLE) ×2 IMPLANT
GOWN STRL REUS W/TWL LRG LVL3 (GOWN DISPOSABLE) ×2
LENS IOL TECNIS 22.0 (Intraocular Lens) ×2 IMPLANT
LENS IOL TECNIS MONO 1P 22.0 (Intraocular Lens) ×1 IMPLANT
PACK CATARACT (MISCELLANEOUS) ×2 IMPLANT
PACK CATARACT BRASINGTON LX (MISCELLANEOUS) ×2 IMPLANT
PACK EYE AFTER SURG (MISCELLANEOUS) ×2 IMPLANT
SOL BSS BAG (MISCELLANEOUS) ×2
SOL PREP PVP 2OZ (MISCELLANEOUS)
SOLUTION BSS BAG (MISCELLANEOUS) ×1 IMPLANT
SOLUTION PREP PVP 2OZ (MISCELLANEOUS) IMPLANT
SYR 5ML LL (SYRINGE) ×2 IMPLANT
SYR TB 1ML 27GX1/2 LL (SYRINGE) ×2 IMPLANT
WATER STERILE IRR 1000ML POUR (IV SOLUTION) ×2 IMPLANT
WIPE NON LINTING 3.25X3.25 (MISCELLANEOUS) ×2 IMPLANT

## 2015-02-13 NOTE — Progress Notes (Signed)
UDS - pos for marijuana - Dr Andree Elk aware of same

## 2015-02-13 NOTE — Anesthesia Postprocedure Evaluation (Signed)
  Anesthesia Post-op Note  Patient: Sarah Payne  Procedure(s) Performed: Procedure(s) with comments: CATARACT EXTRACTION PHACO AND INTRAOCULAR LENS PLACEMENT (IOC) (Right) - cassette lot # [id number] H [place]  00:29.9 AP  20.7 CDE  6.20  Anesthesia type:MAC  Patient location: PACU  Post pain: Pain level controlled  Post assessment: Post-op Vital signs reviewed, Patient's Cardiovascular Status Stable, Respiratory Function Stable, Patent Airway and No signs of Nausea or vomiting  Post vital signs: Reviewed and stable  Last Vitals:  Filed Vitals:   02/13/15 1116  BP: 152/99  Pulse:   Temp:   Resp:     Level of consciousness: awake, alert  and patient cooperative  Complications: No apparent anesthesia complications

## 2015-02-13 NOTE — Anesthesia Preprocedure Evaluation (Signed)
Anesthesia Evaluation  Patient identified by MRN, date of birth, ID band Patient awake    Reviewed: Allergy & Precautions, H&P , NPO status , Patient's Chart, lab work & pertinent test results, reviewed documented beta blocker date and time   Airway Mallampati: II  TM Distance: >3 FB Neck ROM: full    Dental no notable dental hx. (+) Teeth Intact   Pulmonary neg pulmonary ROS, sleep apnea , COPDCurrent Smoker,  breath sounds clear to auscultation  Pulmonary exam normal       Cardiovascular Exercise Tolerance: Poor hypertension, + angina with exertion + CAD, + Past MI, + Peripheral Vascular Disease and +CHF negative cardio ROS  Rhythm:regular Rate:Normal     Neuro/Psych  Headaches, negative neurological ROS  negative psych ROS   GI/Hepatic negative GI ROS, Neg liver ROS, GERD-  ,  Endo/Other  negative endocrine ROSdiabetesHypothyroidism   Renal/GU      Musculoskeletal   Abdominal   Peds  Hematology negative hematology ROS (+)   Anesthesia Other Findings   Reproductive/Obstetrics negative OB ROS                             Anesthesia Physical Anesthesia Plan  ASA: III  Anesthesia Plan: MAC   Post-op Pain Management:    Induction:   Airway Management Planned:   Additional Equipment:   Intra-op Plan:   Post-operative Plan:   Informed Consent: I have reviewed the patients History and Physical, chart, labs and discussed the procedure including the risks, benefits and alternatives for the proposed anesthesia with the patient or authorized representative who has indicated his/her understanding and acceptance.     Plan Discussed with: CRNA  Anesthesia Plan Comments:         Anesthesia Quick Evaluation

## 2015-02-13 NOTE — Transfer of Care (Signed)
Immediate Anesthesia Transfer of Care Note  Patient: Sarah Payne  Procedure(s) Performed: Procedure(s) with comments: CATARACT EXTRACTION PHACO AND INTRAOCULAR LENS PLACEMENT (IOC) (Right) - cassette lot # [id number] H [place]  00:29.9 AP  20.7 CDE  6.20  Patient Location: PACU  Anesthesia Type:MAC  Level of Consciousness: awake, alert , oriented and patient cooperative  Airway & Oxygen Therapy: Patient Spontanous Breathing  Post-op Assessment: Report given to RN and Post -op Vital signs reviewed and stable  Post vital signs: Reviewed and stable  Last Vitals:  Filed Vitals:   02/13/15 1115  BP: 152/99  Pulse:   Temp: 36.6 C  Resp: 16    Complications: No apparent anesthesia complications

## 2015-02-13 NOTE — Op Note (Signed)
PREOPERATIVE DIAGNOSIS:  Nuclear sclerotic cataract of the right eye.   POSTOPERATIVE DIAGNOSIS: nuclear sclerotic cataract right eye   OPERATIVE PROCEDURE:  Procedure(s): CATARACT EXTRACTION PHACO AND INTRAOCULAR LENS PLACEMENT (IOC)   SURGEON:  Birder Robson, MD.   ANESTHESIA:  Anesthesiologist: Molli Barrows, MD CRNA: Bernardo Heater, CRNA  1.      Managed anesthesia care. 2.      Topical tetracaine drops followed by 2% Xylocaine jelly applied in the preoperative holding area.   COMPLICATIONS:  None.   TECHNIQUE:   Stop and chop   DESCRIPTION OF PROCEDURE:  The patient was examined and consented in the preoperative holding area where the aforementioned topical anesthesia was applied to the right eye and then brought back to the Operating Room where the right eye was prepped and draped in the usual sterile ophthalmic fashion and a lid speculum was placed. A paracentesis was created with the side port blade and the anterior chamber was filled with viscoelastic. A near clear corneal incision was performed with the steel keratome. A continuous curvilinear capsulorrhexis was performed with a cystotome followed by the capsulorrhexis forceps. Hydrodissection and hydrodelineation were carried out with BSS on a blunt cannula. The lens was removed in a stop and chop  technique and the remaining cortical material was removed with the irrigation-aspiration handpiece. The capsular bag was inflated with viscoelastic and the Technis ZCB00  lens was placed in the capsular bag without complication. The remaining viscoelastic was removed from the eye with the irrigation-aspiration handpiece. The wounds were hydrated. The anterior chamber was flushed with Miostat and the eye was inflated to physiologic pressure. 0.1 mL of cefuroxime concentration 10 mg/mL was placed in the anterior chamber. The wounds were found to be water tight. The eye was dressed with Vigamox. The patient was given protective glasses to  wear throughout the day and a shield with which to sleep tonight. The patient was also given drops with which to begin a drop regimen today and will follow-up with me in one day.  Implant Name Type Inv. Item Serial No. Manufacturer Lot No. LRB No. Used  LENS IMPL INTRAOC ZCB00 22.0 - [id number] Intraocular Lens LENS IMPL INTRAOC ZCB00 22.0 [id number]   Right 1   Procedure(s) with comments: CATARACT EXTRACTION PHACO AND INTRAOCULAR LENS PLACEMENT (IOC) (Right) - cassette lot # [id number] H [place]  00:29.9 AP  20.7 CDE  6.20  Electronically signed: [place] 02/13/2015 11:12 AM

## 2015-02-13 NOTE — H&P (Signed)
  All labs reviewed. Abnormal studies sent to patients PCP when indicated.  Previous H&P reviewed, patient examined, there are NO CHANGES.  Sarah Payne LOU[place]7/26/201610:07 AM

## 2015-02-13 NOTE — Discharge Instructions (Signed)
AMBULATORY SURGERY  °DISCHARGE INSTRUCTIONS ° ° °1) The drugs that you were given will stay in your system until tomorrow so for the next 24 hours you should not: ° °A) Drive an automobile °B) Make any legal decisions °C) Drink any alcoholic beverage ° ° °2) You may resume regular meals tomorrow.  Today it is better to start with liquids and gradually work up to solid foods. ° °You may eat anything you prefer, but it is better to start with liquids, then soup and crackers, and gradually work up to solid foods. ° ° °3) Please notify your doctor immediately if you have any unusual bleeding, trouble breathing, redness and pain at the surgery site, drainage, fever, or pain not relieved by medication. ° ° ° °4) Additional Instructions: ° ° ° °Cataract Surgery °Care After °Refer to this sheet in the next few weeks. These instructions provide you with information on caring for yourself after your procedure. Your caregiver may also give you more specific instructions. Your treatment has been planned according to current medical practices, but problems sometimes occur. Call your caregiver if you have any problems or questions after your procedure.  °HOME CARE INSTRUCTIONS  °· Avoid strenuous activities as directed by your caregiver. °· Ask your caregiver when you can resume driving. °· Use eyedrops or other medicines to help healing and control pressure inside your eye as directed by your caregiver. °· Only take over-the-counter or prescription medicines for pain, discomfort, or fever as directed by your caregiver. °· Do not to touch or rub your eyes. °· You may be instructed to use a protective shield during the first few days and nights after surgery. If not, wear sunglasses to protect your eyes. This is to protect the eye from pressure or from being accidentally bumped. °· Keep the area around your eye clean and dry. Avoid swimming or allowing water to hit you directly in the face while showering. Keep soap and shampoo  out of your eyes. °· Do not bend or lift heavy objects. Bending increases pressure in the eye. You can walk, climb stairs, and do light household chores. °· Do not put a contact lens into the eye that had surgery until your caregiver says it is okay to do so. °· Ask your doctor when you can return to work. This will depend on the kind of work that you do. If you work in a dusty environment, you may be advised to wear protective eyewear for a period of time. °· Ask your caregiver when it will be safe to engage in sexual activity. °· Continue with your regular eye exams as directed by your caregiver. °What to expect: °· It is normal to feel itching and mild discomfort for a few days after cataract surgery. Some fluid discharge is also common, and your eye may be sensitive to light and touch. °· After 1 to 2 days, even moderate discomfort should disappear. In most cases, healing will take about 6 weeks. °· If you received an intraocular lens (IOL), you may notice that colors are very bright or have a blue tinge. Also, if you have been in bright sunlight, everything may appear reddish for a few hours. If you see these color tinges, it is because your lens is clear and no longer cloudy. Within a few months after receiving an IOL, these extra colors should go away. When you have healed, you will probably need new glasses. °SEEK MEDICAL CARE IF:  °· You have increased bruising around your   eye. °· You have discomfort not helped by medicine. °SEEK IMMEDIATE MEDICAL CARE IF:  °· You have a  fever. °· You have a worsening or sudden vision loss. °· You have redness, swelling, or increasing pain in the eye. °· You have a thick discharge from the eye that had surgery. °MAKE SURE YOU: °· Understand these instructions. °· Will watch your condition. °· Will get help right away if you are not doing well or get worse. °Document Released: 01/24/2005 Document Revised: 09/29/2011 Document Reviewed: 02/28/2011 °Exit[employer]® Patient  Information ©2015 [employer]. This information is not intended to replace advice given to you by your health care provider. Make sure you discuss any questions you have with your health care provider. ° ° ° ° °Please contact your physician with any problems or Same Day Surgery at [phone], Monday through Friday 6 am to 4 pm, or [place] at [place] Main number at [phone]. °

## 2015-02-19 ENCOUNTER — Other Ambulatory Visit: Payer: Self-pay | Admitting: Family Medicine

## 2015-02-23 DIAGNOSIS — E1165 Type 2 diabetes mellitus with hyperglycemia: Secondary | ICD-10-CM | POA: Diagnosis not present

## 2015-02-23 DIAGNOSIS — F17200 Nicotine dependence, unspecified, uncomplicated: Secondary | ICD-10-CM | POA: Diagnosis not present

## 2015-02-23 DIAGNOSIS — E1149 Type 2 diabetes mellitus with other diabetic neurological complication: Secondary | ICD-10-CM | POA: Diagnosis not present

## 2015-02-23 DIAGNOSIS — E1142 Type 2 diabetes mellitus with diabetic polyneuropathy: Secondary | ICD-10-CM | POA: Diagnosis not present

## 2015-02-27 NOTE — Telephone Encounter (Signed)
Left 2nd voicemail requesting pt to call office back 

## 2015-03-01 ENCOUNTER — Other Ambulatory Visit: Payer: Self-pay | Admitting: Family Medicine

## 2015-03-01 NOTE — Telephone Encounter (Signed)
Pt left v/m requesting status of levothyroxine refill; spoke to [place] at [address] and rx ready for pick up.pt voiced understanding and will pick up at pharmacy.

## 2015-03-12 ENCOUNTER — Ambulatory Visit (INDEPENDENT_AMBULATORY_CARE_PROVIDER_SITE_OTHER): Payer: Medicare Other | Admitting: Family Medicine

## 2015-03-12 ENCOUNTER — Ambulatory Visit: Payer: Medicare Other | Admitting: Gastroenterology

## 2015-03-12 ENCOUNTER — Encounter: Payer: Self-pay | Admitting: Family Medicine

## 2015-03-12 VITALS — BP 148/92 | HR 63 | Temp 98.0°F | Ht 65.5 in | Wt 145.2 lb

## 2015-03-12 DIAGNOSIS — Z0000 Encounter for general adult medical examination without abnormal findings: Secondary | ICD-10-CM

## 2015-03-12 DIAGNOSIS — F4323 Adjustment disorder with mixed anxiety and depressed mood: Secondary | ICD-10-CM

## 2015-03-12 DIAGNOSIS — I10 Essential (primary) hypertension: Secondary | ICD-10-CM

## 2015-03-12 DIAGNOSIS — E038 Other specified hypothyroidism: Secondary | ICD-10-CM

## 2015-03-12 DIAGNOSIS — E785 Hyperlipidemia, unspecified: Secondary | ICD-10-CM

## 2015-03-12 DIAGNOSIS — I2510 Atherosclerotic heart disease of native coronary artery without angina pectoris: Secondary | ICD-10-CM

## 2015-03-12 DIAGNOSIS — J449 Chronic obstructive pulmonary disease, unspecified: Secondary | ICD-10-CM

## 2015-03-12 DIAGNOSIS — Z720 Tobacco use: Secondary | ICD-10-CM | POA: Diagnosis not present

## 2015-03-12 DIAGNOSIS — E1151 Type 2 diabetes mellitus with diabetic peripheral angiopathy without gangrene: Secondary | ICD-10-CM

## 2015-03-12 DIAGNOSIS — E039 Hypothyroidism, unspecified: Secondary | ICD-10-CM | POA: Diagnosis not present

## 2015-03-12 DIAGNOSIS — E1159 Type 2 diabetes mellitus with other circulatory complications: Secondary | ICD-10-CM | POA: Diagnosis not present

## 2015-03-12 DIAGNOSIS — I739 Peripheral vascular disease, unspecified: Secondary | ICD-10-CM

## 2015-03-12 LAB — CBC WITH DIFFERENTIAL/PLATELET
Basophils Absolute: 0 10*3/uL (ref 0.0–0.1)
Basophils Relative: 0.5 % (ref 0.0–3.0)
Eosinophils Absolute: 0.1 10*3/uL (ref 0.0–0.7)
Eosinophils Relative: 1.7 % (ref 0.0–5.0)
HCT: 38.4 % (ref 36.0–46.0)
Hemoglobin: 12.7 g/dL (ref 12.0–15.0)
Lymphocytes Relative: 23.8 % (ref 12.0–46.0)
Lymphs Abs: 1.8 10*3/uL (ref 0.7–4.0)
MCHC: 33 g/dL (ref 30.0–36.0)
MCV: 95.5 fl (ref 78.0–100.0)
Monocytes Absolute: 0.5 10*3/uL (ref 0.1–1.0)
Monocytes Relative: 6.5 % (ref 3.0–12.0)
Neutro Abs: 5.2 10*3/uL (ref 1.4–7.7)
Neutrophils Relative %: 67.5 % (ref 43.0–77.0)
Platelets: 270 10*3/uL (ref 150.0–400.0)
RBC: 4.02 Mil/uL (ref 3.87–5.11)
RDW: 14.6 % (ref 11.5–15.5)
WBC: 7.7 10*3/uL (ref 4.0–10.5)

## 2015-03-12 LAB — COMPREHENSIVE METABOLIC PANEL
ALT: 10 U/L (ref 0–35)
AST: 14 U/L (ref 0–37)
Albumin: 4 g/dL (ref 3.5–5.2)
Alkaline Phosphatase: 66 U/L (ref 39–117)
BUN: 15 mg/dL (ref 6–23)
CO2: 33 mEq/L — ABNORMAL HIGH (ref 19–32)
Calcium: 9.5 mg/dL (ref 8.4–10.5)
Chloride: 101 mEq/L (ref 96–112)
Creatinine, Ser: 1.22 mg/dL — ABNORMAL HIGH (ref 0.40–1.20)
GFR: 46.72 mL/min — ABNORMAL LOW (ref >60.00)
Glucose, Bld: 128 mg/dL — ABNORMAL HIGH (ref 70–99)
Potassium: 4.4 mEq/L (ref 3.5–5.1)
Sodium: 140 mEq/L (ref 135–145)
Total Bilirubin: 0.3 mg/dL (ref 0.2–1.2)
Total Protein: 6.7 g/dL (ref 6.0–8.3)

## 2015-03-12 LAB — T4, FREE: Free T4: 0.57 ng/dL — ABNORMAL LOW (ref 0.60–1.60)

## 2015-03-12 LAB — LIPID PANEL
Cholesterol: 225 mg/dL — ABNORMAL HIGH (ref 0–200)
HDL: 57.2 mg/dL (ref >39.00)
LDL Cholesterol: 146 mg/dL — ABNORMAL HIGH (ref 0–99)
NonHDL: 167.52
Total CHOL/HDL Ratio: 4
Triglycerides: 109 mg/dL (ref 0.0–149.0)
VLDL: 21.8 mg/dL (ref 0.0–40.0)

## 2015-03-12 LAB — TSH: TSH: 40.79 u[IU]/mL — ABNORMAL HIGH (ref 0.35–4.50)

## 2015-03-12 MED ORDER — INSULIN GLARGINE 100 UNIT/ML ~~LOC~~ SOLN: SUBCUTANEOUS | Status: DC

## 2015-03-12 MED ORDER — NICOTINE 14 MG/24HR TD PT24: 14.0000 mg | MEDICATED_PATCH | Freq: Every day | TRANSDERMAL | Status: DC

## 2015-03-12 NOTE — Patient Instructions (Signed)
Great to see you. Please think about getting a yearly low radiation chest CT to look for lung cancer.

## 2015-03-12 NOTE — Assessment & Plan Note (Signed)
Due for labs. Continue current dose of lipitor.

## 2015-03-12 NOTE — Progress Notes (Signed)
69 yo pleasant female with h/o CAD, COPD, poorly controlled DM, HLD and hypothyroidism here for CPX and follow up of chronic medical conditions.  I have personally reviewed the Medicare Annual Wellness questionnaire and have noted 1. The patient's medical and social history 2. Their use of alcohol, tobacco or illicit drugs 3. Their current medications and supplements 4. The patient's functional ability including ADL's, fall risks, home safety risks and hearing or visual             impairment. 5. Diet and physical activities 6. Evidence for depression or mood disorders  Colonoscopy 11/02/14- 10 year recall Tdap 03/28/13 Pneumovax 03/28/13 Prevnar 13 04/26/14 Remote h/o hysterectomy Declines zostavax  End of life wishes discussed and updated in Social History.  The roster of all physicians providing medical care to patient - is listed in the CareTeams section of the chart.  Wt Readings from Last 3 Encounters:  03/12/15 145 lb 4 oz (65.885 kg)  02/13/15 136 lb (61.689 kg)  02/01/15 136 lb (61.689 kg)     Uncontrolled type 2 diabetes - followed by endocrinology at [facility]. Last seen on 02/23/2015- note reviewed-  Increased lantus dose to 30 units in PM and advised to continue to increase dose 3 units every 3 days until fasting FSBS less than 130.  Advised to check FSBS twice daily- FSBS having been around 120 fasting now.  Currently taking Lantus 20 units every morning and 30 units at bedtime.   Lab Results  Component Value Date   HGBA1C 8.6* 01/04/2015    CAD- saw Dr. Rockey Situ on 02/01/15.  Note reviewed. Advised to continue current rxs.  Tobacco abuse-  Smoking less than 1 ppd.  She thinks "she may be ready to quit."  HLD- taking lipitor 40 mg daily. Lab Results  Component Value Date   CHOL 292* 08/02/2014   HDL 34* 08/02/2014   LDLCALC 180* 08/02/2014   LDLDIRECT 174.5 04/26/2014   TRIG 388* 08/02/2014   CHOLHDL 8 04/26/2014   Lab Results  Component Value Date   ALT 10  08/30/2014   AST 12 08/30/2014   ALKPHOS 60 08/30/2014   BILITOT 0.4 08/30/2014   Hypothyroidism- currently taking synthroid 175 mcg daily. Very tired but was out of her synthroid for 1 month.  Lab Results  Component Value Date   TSH 53.83* 12/21/2014   Current Outpatient Prescriptions on File Prior to Visit  Medication Sig Dispense Refill  . aspirin 81 MG tablet Take 81 mg by mouth daily.      Marland Kitchen atorvastatin (LIPITOR) 40 MG tablet Take 1 tablet (40 mg total) by mouth daily. 90 tablet 3  . Calcium & Magnesium Carbonates (MYLANTA PO) Take by mouth.      . carvedilol (COREG) 3.125 MG tablet Take 2 tablets (6.25 mg total) by mouth 2 (two) times daily. 180 tablet 3  . clopidogrel (PLAVIX) 75 MG tablet TAKE 1 TABLET (75 MG TOTAL) BY MOUTH DAILY. 90 tablet 3  . cyanocobalamin (,VITAMIN B-12,) 1000 MCG/ML injection Inject 1 mL (1,000 mcg total) into the muscle every 30 (thirty) days. INJECT 1 ML (1,000 MCG TOTAL) INTO THE MUSCLE ONCE. 1 mL 0  . donepezil (ARICEPT) 10 MG tablet Take by mouth.    . DULoxetine (CYMBALTA) 60 MG capsule TAKE 1 CAPSULE BY MOUTH DAILY. 90 capsule 3  . enalapril (VASOTEC) 5 MG tablet Take 1 tablet (5 mg total) by mouth daily. 90 tablet 3  . glipiZIDE (GLUCOTROL) 10 MG tablet Before breakfast and  supper (evening meal) 180 tablet 1  . glucose blood (FREESTYLE LITE) test strip Check blood sugar 3 times daily to check blood sugars - Uncontrolled diabetes mellitus 300 each 3  . glucose monitoring kit (FREESTYLE) monitoring kit 1 each by Does not apply route as needed for other. PHARMACY PLEASE SUBSTITUTE WHATEVER GLUCOMETER YOU CARRY 1 each 1  . insulin glargine (LANTUS) 100 UNIT/ML injection 15 units qhs 10 mL 0  . isosorbide mononitrate (IMDUR) 30 MG 24 hr tablet Take 1 tablet (30 mg total) by mouth daily. 90 tablet 3  . lansoprazole (PREVACID) 15 MG capsule Take by mouth.    . levothyroxine (SYNTHROID, LEVOTHROID) 175 MCG tablet TAKE ONE TABLET BY MOUTH ONCE DAILY BEFORE  BREAKFAST 30 tablet 3  . magnesium 30 MG tablet Take by mouth.    . nitroGLYCERIN (NITROSTAT) 0.4 MG SL tablet Place 1 tablet (0.4 mg total) under the tongue every 5 (five) minutes as needed. 25 tablet 2  . ondansetron (ZOFRAN) 4 MG tablet Take 1 tab every 6 hours as needed for nausea. 30 tablet 0  . sitaGLIPtin (JANUVIA) 100 MG tablet Take 1 tablet (100 mg total) by mouth daily. 90 tablet 3  . traZODone (DESYREL) 100 MG tablet TAKE 1 TABLET (100 MG TOTAL) BY MOUTH AT BEDTIME. 90 tablet 3  . zolpidem (AMBIEN) 10 MG tablet Take by mouth at bedtime as needed for sleep.     No current facility-administered medications on file prior to visit.    Allergies  Allergen Reactions  . No Known Allergies     Past Medical History  Diagnosis Date  . Diabetes mellitus   . Hypertension   . Hypothyroidism   . Depression   . Vascular dementia   . Tobacco abuse     a. 45 pack year history - quit 09/16/11  . Chronic low back pain   . Coronary artery disease     a. s/p PCI/DES LCX x 3;  b. NSTEMI 09/16/11;  c.   09/17/11 Cath [facility]) Severe RCA/LCX dzs - RCA treated, LCX intervention pending w/ Dr. Clayborn Bigness  . Sleep apnea     mild-does not use cpap  . Impingement syndrome of left shoulder   . Tendonitis of left rotator cuff   . Diverticulosis   . Myocardial infarction     x 5  . GERD (gastroesophageal reflux disease)   . Anginal pain     Past Surgical History  Procedure Laterality Date  . Back surgery    . Abdominal hysterectomy    . Appendectomy    . Abdominal hysterectomy    . Colonoscopy N/A 11/02/2014    Procedure: COLONOSCOPY;  Surgeon: Inda Castle, MD;  Location: [place];  Service: Endoscopy;  Laterality: N/A;  . Cataract extraction w/phaco Left 01/30/2015    Procedure: CATARACT EXTRACTION PHACO AND INTRAOCULAR LENS PLACEMENT (IOC);  Surgeon: Birder Robson, MD;  Location: [facility];  Service: Ophthalmology;  Laterality: Left;  [place] 00:47 AP% 19.3 CDE 9.26 fluid pack lot  #[id number]  . Cardiac catheterization  2013  . Coronary angioplasty  2012    stent x 3   . Coronary angioplasty with stent placement  2013  . Cardiac catheterization  2016  . Cataract extraction w/phaco Right 02/13/2015    Procedure: CATARACT EXTRACTION PHACO AND INTRAOCULAR LENS PLACEMENT (IOC);  Surgeon: Birder Robson, MD;  Location: [facility];  Service: Ophthalmology;  Laterality: Right;  cassette lot # [id number] H [place]  00:29.9 AP  20.7 CDE  6.20  Family History  Problem Relation Age of Onset  . Heart attack Mother     First MI @ 46 - Died @ 33  . Heart disease Mother   . Heart disease Father     Died @ 36  . Throat cancer Brother   . Liver cancer Brother   . Colon cancer Sister     Social History   Social History  . Marital Status: Married    Spouse Name: N/A  . Number of Children: N/A  . Years of Education: N/A   Occupational History  . Not on file.   Social History Main Topics  . Smoking status: Current Every Day Smoker -- 0.25 packs/day for 45 years    Types: Cigarettes  . Smokeless tobacco: Never Used     Comment: Has cut back, trying to quit.   . Alcohol Use: No  . Drug Use: Yes    Special: Marijuana     Comment: last night 4.13.16  . Sexual Activity: Not on file   Other Topics Concern  . Not on file   Social History Narrative   Lives at home with her husband in [place].  Previously used marijuana - quit.      Regular exercise: no/ pain from a frozen rotator cuff   Caffeine use: coffee daily and pepsi   The PMH, PSH, Social History, Family History, Medications, and allergies have been reviewed in [facility], and have been updated if relevant.  Review of Systems  Constitutional: Positive for fatigue. Negative for unexpected weight change.  HENT: Negative.   Eyes: Negative.   Respiratory: Negative.   Gastrointestinal: Negative.   Endocrine: Negative.   Genitourinary: Negative.   Musculoskeletal: Positive for back pain.  Skin: Negative.    Allergic/Immunologic: Negative.   Neurological: Negative.   Hematological: Negative.   Psychiatric/Behavioral: Negative.  Negative for agitation.  All other systems reviewed and are negative.      Physical Exam  Constitutional: She is oriented to person, place, and time. She appears well-developed and well-nourished. No distress.  HENT:  Head: Normocephalic and atraumatic.  Eyes: Conjunctivae are normal.  Neck: Normal range of motion. Neck supple. No thyromegaly present.  Cardiovascular: Normal rate, regular rhythm, normal heart sounds and intact distal pulses.   Pulmonary/Chest: Effort normal and breath sounds normal. No respiratory distress. She has no wheezes.  Abdominal: Soft.  Musculoskeletal: Normal range of motion. She exhibits no edema.  Neurological: She is alert and oriented to person, place, and time. No cranial nerve deficit.  Skin: Skin is warm and dry.  Psychiatric: She has a normal mood and affect. Her behavior is normal. Judgment and thought content normal.  Nursing note and vitals reviewed.  BP 148/92 mmHg  Pulse 63  Temp(Src) 98 F (36.7 C) (Oral)  Ht 5' 5.5" (1.664 m)  Wt 145 lb 4 oz (65.885 kg)  BMI 23.79 kg/m2  SpO2 97%

## 2015-03-12 NOTE — Assessment & Plan Note (Signed)
Smoking cessation instruction/counseling given:  counseled patient on the dangers of tobacco use, advised patient to stop smoking, and reviewed strategies to maximize success   eRx sent for Nicoderm

## 2015-03-12 NOTE — Assessment & Plan Note (Signed)
Improving. Followed by endo Continue current rxs. Pneumococcal vaccines UTD.

## 2015-03-12 NOTE — Progress Notes (Signed)
Pre visit review using our clinic review tool, if applicable. No additional management support is needed unless otherwise documented below in the visit note. 

## 2015-03-12 NOTE — Assessment & Plan Note (Signed)
Followed by cardiology. Continue current rxs- on ASA, plavix, statin, imdur. She is considering smoking cessation. See below.

## 2015-03-12 NOTE — Assessment & Plan Note (Signed)
Due to have labs rechecked. Just restarted synthroid. She is having symptoms of hypothyroidism.

## 2015-03-12 NOTE — Addendum Note (Signed)
Addended by: Lucille Passy on: 03/12/2015 11:30 AM   Modules accepted: Miquel Dunn

## 2015-03-12 NOTE — Assessment & Plan Note (Addendum)
The patients weight, height, BMI and visual acuity have been recorded in the chart I have made referrals, counseling and provided education to the patient based review of the above and I have provided the pt with a written personalized care plan for preventive services.  Declines zostavax, mammogram, DEXA  Discussed low radiation dose CT for lung cancer screening- she will think about it.  Orders Placed This Encounter  Procedures  . Hemoglobin A1c  . CBC with Differential/Platelet  . Comprehensive metabolic panel  . Lipid panel  . TSH  . T4, Free  . Hepatitis C Antibody

## 2015-03-13 LAB — HEPATITIS C ANTIBODY: HCV Ab: NEGATIVE

## 2015-03-14 DIAGNOSIS — E1165 Type 2 diabetes mellitus with hyperglycemia: Secondary | ICD-10-CM | POA: Diagnosis not present

## 2015-03-16 DIAGNOSIS — F17200 Nicotine dependence, unspecified, uncomplicated: Secondary | ICD-10-CM | POA: Diagnosis not present

## 2015-03-16 DIAGNOSIS — E039 Hypothyroidism, unspecified: Secondary | ICD-10-CM | POA: Diagnosis not present

## 2015-03-16 DIAGNOSIS — E1149 Type 2 diabetes mellitus with other diabetic neurological complication: Secondary | ICD-10-CM | POA: Diagnosis not present

## 2015-03-16 DIAGNOSIS — E1165 Type 2 diabetes mellitus with hyperglycemia: Secondary | ICD-10-CM | POA: Diagnosis not present

## 2015-04-19 ENCOUNTER — Ambulatory Visit (INDEPENDENT_AMBULATORY_CARE_PROVIDER_SITE_OTHER): Payer: Medicare Other | Admitting: Family Medicine

## 2015-04-19 ENCOUNTER — Encounter: Payer: Self-pay | Admitting: Family Medicine

## 2015-04-19 VITALS — BP 120/86 | HR 80 | Temp 97.7°F | Wt 142.5 lb

## 2015-04-19 DIAGNOSIS — I2510 Atherosclerotic heart disease of native coronary artery without angina pectoris: Secondary | ICD-10-CM

## 2015-04-19 DIAGNOSIS — R21 Rash and other nonspecific skin eruption: Secondary | ICD-10-CM | POA: Insufficient documentation

## 2015-04-19 MED ORDER — IVERMECTIN 0.5 % EX LOTN
TOPICAL_LOTION | CUTANEOUS | Status: DC
Start: 2015-04-19 — End: 2015-08-07

## 2015-04-19 NOTE — Patient Instructions (Signed)
Head and Pubic Lice °Lice are tiny, light [place] insects with claws on the ends of their legs. They are small parasites that live on the human body. Lice often make their home in your hair. They hatch from little round eggs (nits), which are attached to the base of hairs. They spread by: °· Direct contact with an infested person. °· Infested personal items such as combs, brushes, towels, clothing, pillow cases and sheets. °The parasite that causes your condition may also live in clothes which have been worn within the week before treatment. Therefore, it is necessary to wash your clothes, bed linens, towels, combs and brushes. Any woolens can be put in an air-tight plastic bag for one week. You need to use fresh clothes, towels and sheets after your treatment is completed. Re-treatment is usually not necessary if instructions are followed. If necessary, treatment may be repeated in 7 days. The entire family may require treatment. Sexual partners should be treated if the nits are present in the pubic area. °TREATMENT °· Apply enough medicated shampoo or cream to wet hair and skin in and around the infected areas. °· Work thoroughly into hair and leave in according to instructions. °· Add a small amount of water until a good lather forms. °· Rinse thoroughly. °· Towel briskly. °· When hair is dry, any remaining nits, cream or shampoo may be removed with a fine-tooth comb or tweezers. The nits resemble dandruff; however they are glued to the hair follicle and are difficult to brush out. Frequent fine combing and shampoos are necessary. A towel soaked in white vinegar and left on the hair for 2 hours will also help soften the glue which holds the nits on the hair. °Medicated shampoo or cream should not be used on children or pregnant women without a caregiver's prescription or instructions. °SEEK MEDICAL CARE IF:  °· You or your child develops sores that look infected. °· The rash does not go away in one week. °· The  lice or nits return or persist in spite of treatment. °Document Released: 07/07/2005 Document Revised: 09/29/2011 Document Reviewed: 02/03/2007 °ExitCare® Patient Information ©2015 [employer]. This information is not intended to replace advice given to you by your health care Sarah Payne. Make sure you discuss any questions you have with your health care Sarah Payne. ° °

## 2015-04-19 NOTE — Assessment & Plan Note (Signed)
New- consistent with lice. Discussed treatment and prevention of lice infestation. Rx sent for sklice. Call or return to clinic prn if these symptoms worsen or fail to improve as anticipated. The patient indicates understanding of these issues and agrees with the plan.

## 2015-04-19 NOTE — Progress Notes (Signed)
Pre visit review using our clinic review tool, if applicable. No additional management support is needed unless otherwise documented below in the visit note. 

## 2015-04-19 NOTE — Progress Notes (Signed)
Subjective:   Patient ID: Sarah Payne, female    DOB: [birth year], 69 y.o.   MRN: [id number]  Sarah Payne is a pleasant 69 y.o. year old female who presents to clinic today with Rash  on 04/19/2015  HPI:  Head has been very itching for days, especially around her ears and back of neck.  [place] daughter was treated for lice last week but she says she has had her own head checked multiple times and has been told she does not have lice.  Current Outpatient Prescriptions on File Prior to Visit  Medication Sig Dispense Refill  . aspirin 81 MG tablet Take 81 mg by mouth daily.      Marland Kitchen atorvastatin (LIPITOR) 40 MG tablet Take 1 tablet (40 mg total) by mouth daily. 90 tablet 3  . Calcium & Magnesium Carbonates (MYLANTA PO) Take by mouth.      . carvedilol (COREG) 3.125 MG tablet Take 2 tablets (6.25 mg total) by mouth 2 (two) times daily. 180 tablet 3  . clopidogrel (PLAVIX) 75 MG tablet TAKE 1 TABLET (75 MG TOTAL) BY MOUTH DAILY. 90 tablet 3  . cyanocobalamin (,VITAMIN B-12,) 1000 MCG/ML injection Inject 1 mL (1,000 mcg total) into the muscle every 30 (thirty) days. INJECT 1 ML (1,000 MCG TOTAL) INTO THE MUSCLE ONCE. 1 mL 0  . donepezil (ARICEPT) 10 MG tablet Take by mouth.    . DULoxetine (CYMBALTA) 60 MG capsule TAKE 1 CAPSULE BY MOUTH DAILY. 90 capsule 3  . enalapril (VASOTEC) 5 MG tablet Take 1 tablet (5 mg total) by mouth daily. 90 tablet 3  . glipiZIDE (GLUCOTROL) 10 MG tablet Before breakfast and supper (evening meal) 180 tablet 1  . glucose blood (FREESTYLE LITE) test strip Check blood sugar 3 times daily to check blood sugars - Uncontrolled diabetes mellitus 300 each 3  . glucose monitoring kit (FREESTYLE) monitoring kit 1 each by Does not apply route as needed for other. PHARMACY PLEASE SUBSTITUTE WHATEVER GLUCOMETER YOU CARRY 1 each 1  . insulin glargine (LANTUS) 100 UNIT/ML injection 20 units every morning and 30 units qhs 10 mL 0  . isosorbide mononitrate (IMDUR) 30 MG 24 hr tablet  Take 1 tablet (30 mg total) by mouth daily. 90 tablet 3  . lansoprazole (PREVACID) 15 MG capsule Take by mouth.    . levothyroxine (SYNTHROID, LEVOTHROID) 175 MCG tablet TAKE ONE TABLET BY MOUTH ONCE DAILY BEFORE BREAKFAST 30 tablet 3  . magnesium 30 MG tablet Take by mouth.    . nicotine (NICODERM CQ) 14 mg/24hr patch Place 1 patch (14 mg total) onto the skin daily. 28 patch 0  . nitroGLYCERIN (NITROSTAT) 0.4 MG SL tablet Place 1 tablet (0.4 mg total) under the tongue every 5 (five) minutes as needed. 25 tablet 2  . ondansetron (ZOFRAN) 4 MG tablet Take 1 tab every 6 hours as needed for nausea. 30 tablet 0  . sitaGLIPtin (JANUVIA) 100 MG tablet Take 1 tablet (100 mg total) by mouth daily. 90 tablet 3  . traZODone (DESYREL) 100 MG tablet TAKE 1 TABLET (100 MG TOTAL) BY MOUTH AT BEDTIME. 90 tablet 3  . zolpidem (AMBIEN) 10 MG tablet Take by mouth at bedtime as needed for sleep.     No current facility-administered medications on file prior to visit.    Allergies  Allergen Reactions  . No Known Allergies     Past Medical History  Diagnosis Date  . Diabetes mellitus   . Hypertension   .  Hypothyroidism   . Depression   . Vascular dementia   . Tobacco abuse     a. 45 pack year history - quit 09/16/11  . Chronic low back pain   . Coronary artery disease     a. s/p PCI/DES LCX x 3;  b. NSTEMI 09/16/11;  c.   09/17/11 Cath [facility]) Severe RCA/LCX dzs - RCA treated, LCX intervention pending w/ Dr. Clayborn Bigness  . Sleep apnea     mild-does not use cpap  . Impingement syndrome of left shoulder   . Tendonitis of left rotator cuff   . Diverticulosis   . Myocardial infarction     x 5  . GERD (gastroesophageal reflux disease)   . Anginal pain     Past Surgical History  Procedure Laterality Date  . Back surgery    . Abdominal hysterectomy    . Appendectomy    . Abdominal hysterectomy    . Colonoscopy N/A 11/02/2014    Procedure: COLONOSCOPY;  Surgeon: Inda Castle, MD;  Location: [place];  Service: Endoscopy;  Laterality: N/A;  . Cataract extraction w/phaco Left 01/30/2015    Procedure: CATARACT EXTRACTION PHACO AND INTRAOCULAR LENS PLACEMENT (IOC);  Surgeon: Birder Robson, MD;  Location: [facility];  Service: Ophthalmology;  Laterality: Left;  [place] 00:47 AP% 19.3 CDE 9.26 fluid pack lot #[id number]  . Cardiac catheterization  2013  . Coronary angioplasty  2012    stent x 3   . Coronary angioplasty with stent placement  2013  . Cardiac catheterization  2016  . Cataract extraction w/phaco Right 02/13/2015    Procedure: CATARACT EXTRACTION PHACO AND INTRAOCULAR LENS PLACEMENT (IOC);  Surgeon: Birder Robson, MD;  Location: [facility];  Service: Ophthalmology;  Laterality: Right;  cassette lot # [id number] H [place]  00:29.9 AP  20.7 CDE  6.20    Family History  Problem Relation Age of Onset  . Heart attack Mother     First MI @ 29 - Died @ 66  . Heart disease Mother   . Heart disease Father     Died @ 62  . Throat cancer Brother   . Liver cancer Brother   . Colon cancer Sister     Social History   Social History  . Marital Status: Married    Spouse Name: N/A  . Number of Children: N/A  . Years of Education: N/A   Occupational History  . Not on file.   Social History Main Topics  . Smoking status: Current Every Day Smoker -- 0.25 packs/day for 45 years    Types: Cigarettes  . Smokeless tobacco: Never Used     Comment: Has cut back, trying to quit.   . Alcohol Use: No  . Drug Use: Yes    Special: Marijuana     Comment: last night 4.13.16  . Sexual Activity: Not on file   Other Topics Concern  . Not on file   Social History Narrative   Lives at home with her husband in [place].  Previously used marijuana - quit.      Regular exercise: no/ pain from a frozen rotator cuff   Caffeine use: coffee daily and pepsi      Does not have a living will.   Daughters and husband know her wishes- would desire CPR but not prolonged life support if futile   The  PMH, PSH, Social History, Family History, Medications, and allergies have been reviewed in [facility], and have been updated if relevant.   Review  of Systems  Constitutional: Negative.   Skin: Positive for rash.  Neurological: Negative.   Hematological: Negative.   Psychiatric/Behavioral: Negative.   All other systems reviewed and are negative.      Objective:    BP 120/86 mmHg  Pulse 80  Temp(Src) 97.7 F (36.5 C) (Oral)  Wt 142 lb 8 oz (64.638 kg)   Physical Exam  Constitutional: She is oriented to person, place, and time. She appears well-developed and well-nourished. No distress.  HENT:  Head: Normocephalic.    Eyes: Conjunctivae are normal.  Cardiovascular: Normal rate.   Pulmonary/Chest: Effort normal.  Neurological: She is alert and oriented to person, place, and time. No cranial nerve deficit.  Skin:  Excoriation marks behind ears bilaterally and neck at base of scalp  Psychiatric: She has a normal mood and affect. Her behavior is normal. Judgment and thought content normal.  Nursing note and vitals reviewed.         Assessment & Plan:   Rash and nonspecific skin eruption No Follow-up on file.

## 2015-05-29 ENCOUNTER — Ambulatory Visit: Payer: Medicare Other

## 2015-06-01 ENCOUNTER — Ambulatory Visit (INDEPENDENT_AMBULATORY_CARE_PROVIDER_SITE_OTHER): Payer: Medicare Other | Admitting: Family Medicine

## 2015-06-01 VITALS — BP 130/80 | HR 78 | Temp 97.9°F | Ht 65.5 in | Wt 145.1 lb

## 2015-06-01 DIAGNOSIS — R5383 Other fatigue: Secondary | ICD-10-CM | POA: Insufficient documentation

## 2015-06-01 DIAGNOSIS — R531 Weakness: Secondary | ICD-10-CM | POA: Diagnosis not present

## 2015-06-01 DIAGNOSIS — Z23 Encounter for immunization: Secondary | ICD-10-CM | POA: Diagnosis not present

## 2015-06-01 DIAGNOSIS — I2510 Atherosclerotic heart disease of native coronary artery without angina pectoris: Secondary | ICD-10-CM

## 2015-06-01 LAB — CBC
HCT: 44.3 % (ref 36.0–46.0)
Hemoglobin: 14.8 g/dL (ref 12.0–15.0)
MCHC: 33.4 g/dL (ref 30.0–36.0)
MCV: 90.9 fl (ref 78.0–100.0)
Platelets: 339 10*3/uL (ref 150.0–400.0)
RBC: 4.87 Mil/uL (ref 3.87–5.11)
RDW: 14 % (ref 11.5–15.5)
WBC: 11.3 10*3/uL — ABNORMAL HIGH (ref 4.0–10.5)

## 2015-06-01 LAB — COMPREHENSIVE METABOLIC PANEL
ALT: 14 U/L (ref 0–35)
AST: 15 U/L (ref 0–37)
Albumin: 4.1 g/dL (ref 3.5–5.2)
Alkaline Phosphatase: 96 U/L (ref 39–117)
BUN: 15 mg/dL (ref 6–23)
CO2: 27 mEq/L (ref 19–32)
Calcium: 9.6 mg/dL (ref 8.4–10.5)
Chloride: 92 mEq/L — ABNORMAL LOW (ref 96–112)
Creatinine, Ser: 1.35 mg/dL — ABNORMAL HIGH (ref 0.40–1.20)
GFR: 41.54 mL/min — ABNORMAL LOW (ref >60.00)
Glucose, Bld: 456 mg/dL — ABNORMAL HIGH (ref 70–99)
Potassium: 4.3 mEq/L (ref 3.5–5.1)
Sodium: 129 mEq/L — ABNORMAL LOW (ref 135–145)
Total Bilirubin: 0.5 mg/dL (ref 0.2–1.2)
Total Protein: 6.7 g/dL (ref 6.0–8.3)

## 2015-06-01 LAB — TSH: TSH: 53.23 u[IU]/mL — ABNORMAL HIGH (ref 0.35–4.50)

## 2015-06-01 NOTE — Progress Notes (Signed)
Subjective:  Patient ID: Sarah Payne, female    DOB: June 25, 1946  Age: 69 y.o. MRN: [id number]  CC: Weakness, fatigue  HPI:  69 year old female with a complicated past medical history including tobacco abuse, hypothyroidism, CAD, systolic heart failure, COPD, diabetes 2 with complications presents for an acute visit with the above complaints.  Weakness/fatigue  Patient states that she has not been feeling well since this past Thursday after suffering a fall in the middle of the night.  She states that she fell after going to the restroom. She states that she fell and hit the floor.  She states that since that time she's been experiencing significant weakness and feeling fatigued/exhausted.  She denies any associated fever. She does state that she always has chills.  She states that she has shortness of breath but this is at baseline. Denies chest pain.  No exacerbating or relieving factors.  No recent illness.  Patient is unsure of what is going on.  Social Hx   Social History   Social History  . Marital Status: Married    Spouse Name: N/A  . Number of Children: N/A  . Years of Education: N/A   Social History Main Topics  . Smoking status: Current Every Day Smoker -- 0.25 packs/day for 45 years    Types: Cigarettes  . Smokeless tobacco: Never Used     Comment: Has cut back, trying to quit.   . Alcohol Use: No  . Drug Use: Yes    Special: Marijuana     Comment: last night 4.13.16  . Sexual Activity: Not on file   Other Topics Concern  . Not on file   Social History Narrative   Lives at home with her husband in [place].  Previously used marijuana - quit.      Regular exercise: no/ pain from a frozen rotator cuff   Caffeine use: coffee daily and pepsi      Does not have a living will.   Daughters and husband know her wishes- would desire CPR but not prolonged life support if futile   Review of Systems  Constitutional: Positive for chills. Negative for  fever.  Respiratory: Positive for shortness of breath.        SOB @ baseline.  Cardiovascular: Negative for chest pain.   Objective:  BP 130/80 mmHg  Pulse 78  Temp(Src) 97.9 F (36.6 C) (Oral)  Ht 5' 5.5" (1.664 m)  Wt 145 lb 2 oz (65.828 kg)  BMI 23.77 kg/m2  SpO2 95%  BP/Weight 06/01/2015 04/19/2015 [id number]  Systolic BP [id number]  Diastolic BP 80 86 92  Wt. (Lbs) 145.13 142.5 145.25  BMI 23.77 23.34 23.79   Physical Exam  Constitutional: She appears well-developed. No distress.  HENT:  Head: Normocephalic and atraumatic.  Mouth/Throat: Oropharynx is clear and moist.  Eyes: Conjunctivae are normal.  Cardiovascular: Normal rate and regular rhythm.   No LE edema.   Pulmonary/Chest: Effort normal. She has no wheezes. She has no rales.  Abdominal: Soft. She exhibits no distension. There is no tenderness.  Neurological: She is alert.  Skin:  No bruising noted from recent fall.  Psychiatric: She has a normal mood and affect.  Vitals reviewed.   Lab Results  Component Value Date   WBC 7.7 03/12/2015   HGB 12.7 03/12/2015   HCT 38.4 03/12/2015   PLT 270.0 03/12/2015   GLUCOSE 128* 03/12/2015   CHOL 225* 03/12/2015   TRIG 109.0 03/12/2015   HDL 57.20 03/12/2015  LDLDIRECT 174.5 04/26/2014   LDLCALC 146* 03/12/2015   ALT 10 03/12/2015   AST 14 03/12/2015   NA 140 03/12/2015   K 4.4 03/12/2015   CL 101 03/12/2015   CREATININE 1.22* 03/12/2015   BUN 15 03/12/2015   CO2 33* 03/12/2015   TSH 40.79* 03/12/2015   INR 0.9 08/01/2014   HGBA1C 8.6* 01/04/2015   MICROALBUR 1.2 05/30/2013    Assessment & Plan:   Problem List Items Addressed This Visit    Fatigue - Primary    This is likely multifactorial in nature given patient's long list of medical problems. Obtaining labs today to assess for underlying organic pathology. History and exam unrevealing today. She denies any chest pain. No evidence of volume overload. No signs of ischemia. Advised her to  follow-up closely with her PCP.        Relevant Orders   CBC   Comprehensive metabolic panel   TSH    Other Visit Diagnoses    Weakness        Relevant Orders    CBC    Comprehensive metabolic panel    TSH      Follow-up: Advised close follow-up with PCP.  Thersa Salt, DO

## 2015-06-01 NOTE — Patient Instructions (Signed)
It was nice to see you today.  Your exam was normal.  We will check some labs today.  Follow up closely with Dr. Deborra Medina  Take care  Dr. Lacinda Axon

## 2015-06-01 NOTE — Assessment & Plan Note (Signed)
This is likely multifactorial in nature given patient's long list of medical problems. Obtaining labs today to assess for underlying organic pathology. History and exam unrevealing today. She denies any chest pain. No evidence of volume overload. No signs of ischemia. Advised her to follow-up closely with her PCP.

## 2015-06-01 NOTE — Progress Notes (Signed)
Pre visit review using our clinic review tool, if applicable. No additional management support is needed unless otherwise documented below in the visit note. 

## 2015-06-05 ENCOUNTER — Ambulatory Visit (INDEPENDENT_AMBULATORY_CARE_PROVIDER_SITE_OTHER): Payer: Medicare Other | Admitting: Family Medicine

## 2015-06-05 ENCOUNTER — Encounter: Payer: Self-pay | Admitting: Family Medicine

## 2015-06-05 ENCOUNTER — Telehealth: Payer: Self-pay | Admitting: Family Medicine

## 2015-06-05 VITALS — BP 113/59 | HR 51 | Temp 97.7°F | Ht 65.5 in | Wt 148.5 lb

## 2015-06-05 DIAGNOSIS — Z91199 Patient's noncompliance with other medical treatment and regimen due to unspecified reason: Secondary | ICD-10-CM

## 2015-06-05 DIAGNOSIS — E1151 Type 2 diabetes mellitus with diabetic peripheral angiopathy without gangrene: Secondary | ICD-10-CM

## 2015-06-05 DIAGNOSIS — R42 Dizziness and giddiness: Secondary | ICD-10-CM

## 2015-06-05 DIAGNOSIS — R5383 Other fatigue: Secondary | ICD-10-CM

## 2015-06-05 DIAGNOSIS — I2510 Atherosclerotic heart disease of native coronary artery without angina pectoris: Secondary | ICD-10-CM | POA: Diagnosis not present

## 2015-06-05 DIAGNOSIS — Z9119 Patient's noncompliance with other medical treatment and regimen: Secondary | ICD-10-CM | POA: Diagnosis not present

## 2015-06-05 DIAGNOSIS — R112 Nausea with vomiting, unspecified: Secondary | ICD-10-CM

## 2015-06-05 DIAGNOSIS — E038 Other specified hypothyroidism: Secondary | ICD-10-CM

## 2015-06-05 LAB — GLUCOSE, POCT (MANUAL RESULT ENTRY): POC Glucose: 204 mg/dl — AB (ref 70–99)

## 2015-06-05 MED ORDER — ONDANSETRON HCL 4 MG PO TABS: ORAL_TABLET | ORAL | Status: DC

## 2015-06-05 NOTE — Assessment & Plan Note (Signed)
No clear infection. Doubt Gi viral illness as no diarrhea, fever.  May be due to poor sugar control . Treat with zofran prn. Try to eat regular healthy meals.

## 2015-06-05 NOTE — Progress Notes (Signed)
Pre visit review using our clinic review tool, if applicable. No additional management support is needed unless otherwise documented below in the visit note. 

## 2015-06-05 NOTE — Telephone Encounter (Signed)
Pt has appt 06/05/15 at 9:45 with Dr Diona Browner.

## 2015-06-05 NOTE — Patient Instructions (Addendum)
Push water. Keep meds the same. Check blood sugar 3-4 times a day.  Bring meds to appointment.   Dr. Gabriel Carina will call you for an appointment.

## 2015-06-05 NOTE — Telephone Encounter (Signed)
Patient Name: Sarah Payne DOB: 11/13/1945 Initial Comment Caller states they're dizzy, has fell twice, feels like they're drunk. Walking into walls. Nurse Assessment Nurse: Ronnald Ramp, RN, Miranda Date/Time (Eastern Time): 06/05/2015 8:48:19 AM Confirm and document reason for call. If symptomatic, describe symptoms. ---Caller states she has had a dizziness and feeling unsteady since Thursday. She fell x 2 on Thursday. She was seen on Thursday but no diagnosis or directives given. She states the drew labs but she has not heard anything about the results. She does not feel like she is getting better. Also with nausea (vomited x 1 on Saturday). Has the patient traveled out of the country within the last 30 days? ---Not Applicable Does the patient have any new or worsening symptoms? ---Yes Will a triage be completed? ---Yes Related visit to physician within the last 2 weeks? ---Yes Does the PT have any chronic conditions? (i.e. diabetes, asthma, etc.) ---Yes List chronic conditions. ---Heart, Thyroid, Diabetes, Guidelines Guideline Title Affirmed Question Affirmed Notes Dizziness - Vertigo [1] MODERATE dizziness (e.g., vertigo; feels very unsteady, interferes with normal activities) AND [2] has NOT been evaluated by physician for this Final Disposition User See Physician within 24 Hours Ronnald Ramp, RN, Miranda Comments Appt scheduled for today at 9:45 am with Dr. Diona Browner. Referrals REFERRED TO PCP OFFICE

## 2015-06-05 NOTE — Assessment & Plan Note (Addendum)
Pt back on thyroid medication.  Given pt and PCP confusion in past.. Asked Dr. Gabriel Carina to take over treatment of thyroid. She has agreed.

## 2015-06-05 NOTE — Assessment & Plan Note (Signed)
Spoke with her  ENDO, Dr. Gabriel Carina . She recommended having pt take blood sugar Rx for strips given) 3-4 times daily, push fluids, and continue other med. Dr. Joycie Peek office will call pt with appointment to be seen in next few days.

## 2015-06-05 NOTE — Progress Notes (Signed)
Subjective:    Patient ID: Sarah Payne, female    DOB: 1946/02/13, 69 y.o.   MRN: [id number]  HPI  69 year old female pt of Dr. Hulen Shouts with history of CAD,hypothyroidism, DM presents with  continue weakness and fatogue.  She was seen for weakness and fatigue at [facility] on 11/11 by Dr. Lacinda Axon. Had lab eval CBG was found to be 456!! TSH elevated at 53.  sodium low but  Normal after corrected for hyperglycemia.  Today she report nausea, out of balance, weakness. She fell twice last week. She  Had emesis 2 times over the weekend, none since. No diarrhea NO VERTIGO. She has not had any food and liquids today .Marland Kitchen Because of nausea.  She reports she has been taking morning dose, but often forgets night time dose often. Taking glucotrol and [place] every day.  She is out of test strips so cannot check blood sugar. Reviewed last OV 03/2015 of ENDO. Was to start Trul[facility] but  [facility] it. She never called [facility]O regarding it.  She was out of thyroid med for 1 week prior to blood test. Now back on in last 5 days.  She is seeing Dr. Gabriel Carina.. Last OV September.   Review of Systems  Constitutional: Positive for fatigue. Negative for fever.  HENT: Negative for ear pain.   Eyes: Negative for pain.  Respiratory: Positive for shortness of breath. Negative for cough, chest tightness and wheezing.        She is a smoker  Cardiovascular: Negative for chest pain, palpitations and leg swelling.  Gastrointestinal: Negative for abdominal pain.  Genitourinary: Negative for dysuria.  Musculoskeletal: Positive for back pain.       Objective:   Physical Exam  Constitutional: Vital signs are normal. She appears well-developed and well-nourished. She appears lethargic. She is cooperative.  Non-toxic appearance. She does not appear ill. No distress.  HENT:  Head: Normocephalic.  Right Ear: Hearing, tympanic membrane, external ear and ear canal normal. Tympanic membrane is not erythematous, not  retracted and not bulging.  Left Ear: Hearing, tympanic membrane, external ear and ear canal normal. Tympanic membrane is not erythematous, not retracted and not bulging.  Nose: No mucosal edema or rhinorrhea. Right sinus exhibits no maxillary sinus tenderness and no frontal sinus tenderness. Left sinus exhibits no maxillary sinus tenderness and no frontal sinus tenderness.  Mouth/Throat: Uvula is midline, oropharynx is clear and moist and mucous membranes are normal.  Eyes: Conjunctivae, EOM and lids are normal. Pupils are equal, round, and reactive to light. Lids are everted and swept, no foreign bodies found.  Neck: Trachea normal and normal range of motion. Neck supple. Carotid bruit is not present. No thyroid mass and no thyromegaly present.  Cardiovascular: Normal rate, regular rhythm, S1 normal, S2 normal, normal heart sounds, intact distal pulses and normal pulses.  Exam reveals no gallop and no friction rub.   No murmur heard. Pulmonary/Chest: Effort normal and breath sounds normal. No tachypnea. No respiratory distress. She has no decreased breath sounds. She has no wheezes. She has no rhonchi. She has no rales.  Abdominal: Soft. Normal appearance and bowel sounds are normal. There is no tenderness.  Neurological: She appears lethargic.  Skin: Skin is warm, dry and intact. No rash noted.  Psychiatric: Her speech is normal and behavior is normal. Judgment and thought content normal. Her mood appears not anxious. Cognition and memory are normal. She does not exhibit a depressed mood.  Assessment & Plan:

## 2015-06-05 NOTE — Assessment & Plan Note (Signed)
Likely due to noncompliance and poorly controlled glucose and poor thyroid control.

## 2015-06-18 ENCOUNTER — Ambulatory Visit (INDEPENDENT_AMBULATORY_CARE_PROVIDER_SITE_OTHER): Payer: Medicare Other | Admitting: Family Medicine

## 2015-06-18 ENCOUNTER — Encounter: Payer: Self-pay | Admitting: Family Medicine

## 2015-06-18 VITALS — BP 149/104 | HR 65 | Temp 98.2°F | Wt 148.0 lb

## 2015-06-18 DIAGNOSIS — I2510 Atherosclerotic heart disease of native coronary artery without angina pectoris: Secondary | ICD-10-CM

## 2015-06-18 DIAGNOSIS — R112 Nausea with vomiting, unspecified: Secondary | ICD-10-CM

## 2015-06-18 DIAGNOSIS — E038 Other specified hypothyroidism: Secondary | ICD-10-CM | POA: Diagnosis not present

## 2015-06-18 DIAGNOSIS — Z9119 Patient's noncompliance with other medical treatment and regimen: Secondary | ICD-10-CM | POA: Diagnosis not present

## 2015-06-18 DIAGNOSIS — Z91199 Patient's noncompliance with other medical treatment and regimen due to unspecified reason: Secondary | ICD-10-CM

## 2015-06-18 LAB — CBC WITH DIFFERENTIAL/PLATELET
Basophils Absolute: 0 10*3/uL (ref 0.0–0.1)
Basophils Relative: 0.2 % (ref 0.0–3.0)
Eosinophils Absolute: 0.1 10*3/uL (ref 0.0–0.7)
Eosinophils Relative: 1.4 % (ref 0.0–5.0)
HCT: 39.1 % (ref 36.0–46.0)
Hemoglobin: 12.8 g/dL (ref 12.0–15.0)
Lymphocytes Relative: 26.2 % (ref 12.0–46.0)
Lymphs Abs: 2 10*3/uL (ref 0.7–4.0)
MCHC: 32.7 g/dL (ref 30.0–36.0)
MCV: 93.6 fl (ref 78.0–100.0)
Monocytes Absolute: 0.6 10*3/uL (ref 0.1–1.0)
Monocytes Relative: 7.4 % (ref 3.0–12.0)
Neutro Abs: 5 10*3/uL (ref 1.4–7.7)
Neutrophils Relative %: 64.8 % (ref 43.0–77.0)
Platelets: 225 10*3/uL (ref 150.0–400.0)
RBC: 4.18 Mil/uL (ref 3.87–5.11)
RDW: 14.6 % (ref 11.5–15.5)
WBC: 7.8 10*3/uL (ref 4.0–10.5)

## 2015-06-18 LAB — COMPREHENSIVE METABOLIC PANEL
ALT: 12 U/L (ref 0–35)
AST: 14 U/L (ref 0–37)
Albumin: 3.9 g/dL (ref 3.5–5.2)
Alkaline Phosphatase: 68 U/L (ref 39–117)
BUN: 14 mg/dL (ref 6–23)
CO2: 30 mEq/L (ref 19–32)
Calcium: 9.5 mg/dL (ref 8.4–10.5)
Chloride: 100 mEq/L (ref 96–112)
Creatinine, Ser: 1.01 mg/dL (ref 0.40–1.20)
GFR: 58.06 mL/min — ABNORMAL LOW (ref >60.00)
Glucose, Bld: 211 mg/dL — ABNORMAL HIGH (ref 70–99)
Potassium: 4.1 mEq/L (ref 3.5–5.1)
Sodium: 137 mEq/L (ref 135–145)
Total Bilirubin: 0.5 mg/dL (ref 0.2–1.2)
Total Protein: 6.8 g/dL (ref 6.0–8.3)

## 2015-06-18 LAB — H. PYLORI ANTIBODY, IGG: H Pylori IgG: NEGATIVE

## 2015-06-18 LAB — TSH: TSH: 0.52 u[IU]/mL (ref 0.35–4.50)

## 2015-06-18 LAB — LIPASE: Lipase: 43 U/L (ref 11.0–59.0)

## 2015-06-18 MED ORDER — METOCLOPRAMIDE HCL 10 MG PO TABS: 10.0000 mg | ORAL_TABLET | Freq: Four times a day (QID) | ORAL | Status: DC

## 2015-06-18 NOTE — Assessment & Plan Note (Signed)
Deteriorated with multiple medical problems that are not under good control. Fasting FSBS today is 197.  She admits to not checking her finger sticks but says she is taking her rxs as prescribed.  Has follow up scheduled with endo. I question if symptoms are related to some gastroparesis- start reglan, check labs today. The patient indicates understanding of these issues and agrees with the plan.  Orders Placed This Encounter  Procedures  . Comprehensive metabolic panel  . TSH  . CBC with Differential/Platelet  . H. pylori antibody, IgG  . Lipase

## 2015-06-18 NOTE — Progress Notes (Signed)
Subjective:   Patient ID: Sarah Payne, female    DOB: 1946-03-13, 69 y.o.   MRN: [id number]  Sarah Payne is a pleasant 69 y.o. year old female who presents to clinic today with Fatigue and Nausea  on 06/18/2015  HPI:  Worsening nausea and vomiting.  Chronic issue.  Followed by Dr. Deatra Ina.  Was last seen by GI in 12/2014.  Saw my partner, Dr. Diona Browner recently on 06/05/3015- note reviewed. Was seen several days prior to that by Dr. Lacinda Axon for weakness.  Persistent complaints of nausea, vomiting, weakness.   Has fallen twice.  Gets dizzy right before she falls. No vertigo.  Past week, just nauseated, no vomiting.  Seeing Dr. Gabriel Carina for poorly controlled DM and now she is also managing her hypothyroidism- compliance has been a persistent issue. She has not been checking her FSBS.     Lab Results  Component Value Date   TSH 53.23* 06/01/2015   Lab Results  Component Value Date   HGBA1C 8.6* 01/04/2015   Dr. Diona Browner refilled her rx for zofran, Sarah Payne feels this is not helping.   Has a h/o c diff colitis but this "feels different."  No diarrhea.    Recent Results (from the past 2160 hour(s))  CBC     Status: Abnormal   Collection Time: 06/01/15 11:50 AM  Result Value Ref Range   WBC 11.3 (H) 4.0 - 10.5 K/uL   RBC 4.87 3.87 - 5.11 Mil/uL   Platelets 339.0 150.0 - 400.0 K/uL   Hemoglobin 14.8 12.0 - 15.0 g/dL   HCT 44.3 36.0 - 46.0 %   MCV 90.9 78.0 - 100.0 fl   MCHC 33.4 30.0 - 36.0 g/dL   RDW 14.0 11.5 - 15.5 %  Comprehensive metabolic panel     Status: Abnormal   Collection Time: 06/01/15 11:50 AM  Result Value Ref Range   Sodium 129 (L) 135 - 145 mEq/L   Potassium 4.3 3.5 - 5.1 mEq/L   Chloride 92 (L) 96 - 112 mEq/L   CO2 27 19 - 32 mEq/L   Glucose, Bld 456 (H) 70 - 99 mg/dL   BUN 15 6 - 23 mg/dL   Creatinine, Ser 1.35 (H) 0.40 - 1.20 mg/dL   Total Bilirubin 0.5 0.2 - 1.2 mg/dL   Alkaline Phosphatase 96 39 - 117 U/L   AST 15 0 - 37 U/L   ALT 14 0 - 35  U/L   Total Protein 6.7 6.0 - 8.3 g/dL   Albumin 4.1 3.5 - 5.2 g/dL   Calcium 9.6 8.4 - 10.5 mg/dL   GFR 41.54 (L) >60.00 mL/min  TSH     Status: Abnormal   Collection Time: 06/01/15 11:50 AM  Result Value Ref Range   TSH 53.23 (H) 0.35 - 4.50 uIU/mL  POCT glucose (manual entry)     Status: Abnormal   Collection Time: 06/05/15 11:06 AM  Result Value Ref Range   POC Glucose 204 (A) 70 - 99 mg/dl    Review of Systems  Constitutional: Positive for fatigue. Negative for fever.  Eyes: Negative.   Respiratory: Negative.   Cardiovascular: Negative.   Gastrointestinal: Positive for nausea, abdominal pain and abdominal distention. Negative for vomiting, diarrhea, constipation, blood in stool, anal bleeding and rectal pain.  Endocrine: Positive for polydipsia and polyuria.  Genitourinary: Negative.   Musculoskeletal: Positive for back pain. Negative for myalgias and arthralgias.  Neurological: Positive for dizziness and weakness. Negative for tremors, seizures, syncope, facial asymmetry,  speech difficulty, light-headedness, numbness and headaches.  All other systems reviewed and are negative.      Objective:    BP 149/104 mmHg  Pulse 65  Temp(Src) 98.2 F (36.8 C) (Oral)  Wt 148 lb (67.132 kg)  SpO2 98%  Wt Readings from Last 3 Encounters:  06/18/15 148 lb (67.132 kg)  06/05/15 148 lb 8 oz (67.359 kg)  06/01/15 145 lb 2 oz (65.828 kg)    Physical Exam  Constitutional: She is oriented to person, place, and time. She appears well-developed and well-nourished. No distress.  HENT:  Head: Normocephalic and atraumatic.  Dix hallpike neg  Eyes: Conjunctivae are normal.  Cardiovascular: Normal rate.   Pulmonary/Chest: Effort normal.  Abdominal: Soft. She exhibits no distension. There is tenderness. There is no rebound and no guarding.  Musculoskeletal: Normal range of motion.  Neurological: She is alert and oriented to person, place, and time. No cranial nerve deficit.  Skin:  Skin is warm and dry.  Psychiatric: She has a normal mood and affect. Her behavior is normal. Judgment and thought content normal.  Nursing note and vitals reviewed.         Assessment & Plan:   Nausea and vomiting, intractability of vomiting not specified, unspecified vomiting type No Follow-up on file.

## 2015-06-21 ENCOUNTER — Telehealth: Payer: Self-pay | Admitting: *Deleted

## 2015-06-21 NOTE — Telephone Encounter (Signed)
Sarah Payne, we are the PCP, but the documentation will need to be done by the person who gave it

## 2015-06-21 NOTE — Telephone Encounter (Signed)
Please send to PCP

## 2015-06-21 NOTE — Telephone Encounter (Signed)
I sent you a staff message, but i'm not sure if you received it. This pt was seen in office on 11/28 and states that she received a flu shot at her 06/01/15 appt in her R arm. pls document accordingly. Thanks

## 2015-06-21 NOTE — Telephone Encounter (Signed)
This is not our patient. Notify PCP.

## 2015-07-09 DIAGNOSIS — E1142 Type 2 diabetes mellitus with diabetic polyneuropathy: Secondary | ICD-10-CM | POA: Diagnosis not present

## 2015-07-09 DIAGNOSIS — F17200 Nicotine dependence, unspecified, uncomplicated: Secondary | ICD-10-CM | POA: Diagnosis not present

## 2015-07-09 DIAGNOSIS — Z794 Long term (current) use of insulin: Secondary | ICD-10-CM | POA: Diagnosis not present

## 2015-07-09 DIAGNOSIS — E1165 Type 2 diabetes mellitus with hyperglycemia: Secondary | ICD-10-CM | POA: Diagnosis not present

## 2015-07-09 DIAGNOSIS — E039 Hypothyroidism, unspecified: Secondary | ICD-10-CM | POA: Diagnosis not present

## 2015-07-22 HISTORY — PX: SHOULDER SURGERY: SHX246

## 2015-08-07 ENCOUNTER — Ambulatory Visit (INDEPENDENT_AMBULATORY_CARE_PROVIDER_SITE_OTHER): Payer: Medicare Other | Admitting: Family Medicine

## 2015-08-07 ENCOUNTER — Encounter: Payer: Self-pay | Admitting: Family Medicine

## 2015-08-07 VITALS — BP 128/82 | HR 64 | Temp 98.1°F | Wt 145.0 lb

## 2015-08-07 DIAGNOSIS — R1030 Lower abdominal pain, unspecified: Secondary | ICD-10-CM

## 2015-08-07 DIAGNOSIS — R350 Frequency of micturition: Secondary | ICD-10-CM | POA: Diagnosis not present

## 2015-08-07 DIAGNOSIS — E1151 Type 2 diabetes mellitus with diabetic peripheral angiopathy without gangrene: Secondary | ICD-10-CM

## 2015-08-07 DIAGNOSIS — R81 Glycosuria: Secondary | ICD-10-CM | POA: Diagnosis not present

## 2015-08-07 LAB — COMPREHENSIVE METABOLIC PANEL
ALT: 7 U/L (ref 0–35)
AST: 8 U/L (ref 0–37)
Albumin: 3.8 g/dL (ref 3.5–5.2)
Alkaline Phosphatase: 87 U/L (ref 39–117)
BUN: 14 mg/dL (ref 6–23)
CO2: 29 mEq/L (ref 19–32)
Calcium: 9.6 mg/dL (ref 8.4–10.5)
Chloride: 101 mEq/L (ref 96–112)
Creatinine, Ser: 1 mg/dL (ref 0.40–1.20)
GFR: 58.7 mL/min — ABNORMAL LOW (ref >60.00)
Glucose, Bld: 304 mg/dL — ABNORMAL HIGH (ref 70–99)
Potassium: 3.9 mEq/L (ref 3.5–5.1)
Sodium: 137 mEq/L (ref 135–145)
Total Bilirubin: 0.3 mg/dL (ref 0.2–1.2)
Total Protein: 7 g/dL (ref 6.0–8.3)

## 2015-08-07 LAB — CBC WITH DIFFERENTIAL/PLATELET
Basophils Absolute: 0 10*3/uL (ref 0.0–0.1)
Basophils Relative: 0.4 % (ref 0.0–3.0)
Eosinophils Absolute: 0.1 10*3/uL (ref 0.0–0.7)
Eosinophils Relative: 0.8 % (ref 0.0–5.0)
HCT: 39.8 % (ref 36.0–46.0)
Hemoglobin: 13.2 g/dL (ref 12.0–15.0)
Lymphocytes Relative: 26.4 % (ref 12.0–46.0)
Lymphs Abs: 2.2 10*3/uL (ref 0.7–4.0)
MCHC: 33.2 g/dL (ref 30.0–36.0)
MCV: 91.8 fl (ref 78.0–100.0)
Monocytes Absolute: 0.6 10*3/uL (ref 0.1–1.0)
Monocytes Relative: 7.7 % (ref 3.0–12.0)
Neutro Abs: 5.4 10*3/uL (ref 1.4–7.7)
Neutrophils Relative %: 64.7 % (ref 43.0–77.0)
Platelets: 273 10*3/uL (ref 150.0–400.0)
RBC: 4.33 Mil/uL (ref 3.87–5.11)
RDW: 13.4 % (ref 11.5–15.5)
WBC: 8.4 10*3/uL (ref 4.0–10.5)

## 2015-08-07 LAB — POC URINALSYSI DIPSTICK (AUTOMATED)
Bilirubin, UA: NEGATIVE
Blood, UA: NEGATIVE
Ketones, UA: NEGATIVE
Leukocytes, UA: NEGATIVE
Nitrite, UA: NEGATIVE
Protein, UA: NEGATIVE
Spec Grav, UA: 1.02
Urobilinogen, UA: 0.2
pH, UA: 6

## 2015-08-07 LAB — LIPASE: Lipase: 53 U/L (ref 11.0–59.0)

## 2015-08-07 NOTE — Assessment & Plan Note (Signed)
No acute abdomen today. UA/micro with glucosuria but no evidence of infection. Concern for hyperglycemia leading to some of these symptoms  Further eval abdominal pain with labwork - CBC, CMP, lipase. Will update with results.

## 2015-08-07 NOTE — Patient Instructions (Addendum)
I am worried about high sugars causing some of your symptoms. I want you to push fluids and take lantus and novolog per Dr Joycie Peek recommendations.  labwork today to evaluate for other causes of abdominal and back pain.  If worsening pain, or nausea/vomiting, please seek urgent care. No urine infection today.

## 2015-08-07 NOTE — Progress Notes (Signed)
Pre visit review using our clinic review tool, if applicable. No additional management support is needed unless otherwise documented below in the visit note. 

## 2015-08-07 NOTE — Assessment & Plan Note (Signed)
Concern for hyperglycemia accounting for several sxs today - further eval with labwork including lipase, CBC, CMP today. Advised push fluids, reviewed insulin dosing - pt thinks she received 2 lantus pens instead of novolog flexpen and has been taking her insulin incorrectly - will go directly to pharmacy from here to pick up novolog.  Has f/u with endo scheduled for next week.

## 2015-08-07 NOTE — Progress Notes (Signed)
BP 128/82 mmHg  Pulse 64  Temp(Src) 98.1 F (36.7 C) (Oral)  Wt 145 lb (65.772 kg)   CC: "I'm hurting"  Subjective:    Patient ID: Sarah Payne, female    DOB: 1946/03/10, 70 y.o.   MRN: [id number]  HPI: Sarah Payne is a 70 y.o. female presenting on 08/07/2015 for Urinary Tract Infection   2-3 wk h/o lower abdominal, lower back pain, urgency, frequency and urinary burning. Pain described as dull cramping ache. Self treating with increased water intake. Also tried naprosyn which helped.   No fevers/chills, nausea/vomiting, hematuria, blood in stool or diarrhea/constipation. No yeast infection sxs (rash, discharge)  No h/o kidney infection in the past.   Uncontrolled diabetes despite reported compliance with glipizide 68m bid, lantus 20u am 30u pm, januvia 1019mdaily. Endorses sugars out of control recently >200 over last 4-5 months. Sees Dr SoGabriel Carinandocrinologist - added ?mealtime insulin - pt very unclear about this, thinks both shots she receives are actually lantus. We will call [facility] to verify what insulin she is on. Has f/u with endo next week. Verified with [facility] - pt should be on lantus 40u QPM, novolog flexpen 12u TID AC. Pt will go to pharmacy today to verify this is what they gave her. Med list updated.  Lab Results  Component Value Date   HGBA1C 8.6* 01/04/2015     On aspirin/plavix in h/o CAD with 5 MI.  Continued smoker.  Relevant past medical, surgical, family and social history reviewed and updated as indicated. Interim medical history since our last visit reviewed. Allergies and medications reviewed and updated. Current Outpatient Prescriptions on File Prior to Visit  Medication Sig  . aspirin 81 MG tablet Take 81 mg by mouth daily.    . Marland Kitchentorvastatin (LIPITOR) 40 MG tablet Take 1 tablet (40 mg total) by mouth daily.  . Calcium & Magnesium Carbonates (MYLANTA PO) Take by mouth.    . carvedilol (COREG) 3.125 MG tablet Take 2 tablets (6.25 mg total) by mouth  2 (two) times daily.  . clopidogrel (PLAVIX) 75 MG tablet TAKE 1 TABLET (75 MG TOTAL) BY MOUTH DAILY.  . cyanocobalamin (,VITAMIN B-12,) 1000 MCG/ML injection Inject 1 mL (1,000 mcg total) into the muscle every 30 (thirty) days. INJECT 1 ML (1,000 MCG TOTAL) INTO THE MUSCLE ONCE.  . Marland Kitchenonepezil (ARICEPT) 10 MG tablet Take by mouth.  . DULoxetine (CYMBALTA) 60 MG capsule TAKE 1 CAPSULE BY MOUTH DAILY.  . Marland Kitchennalapril (VASOTEC) 5 MG tablet Take 1 tablet (5 mg total) by mouth daily.  . Marland KitchenlipiZIDE (GLUCOTROL) 10 MG tablet Before breakfast and supper (evening meal)  . glucose blood (FREESTYLE LITE) test strip Check blood sugar 3 times daily to check blood sugars - Uncontrolled diabetes mellitus  . glucose monitoring kit (FREESTYLE) monitoring kit 1 each by Does not apply route as needed for other. PHARMACY PLEASE SUBSTITUTE WHATEVER GLUCOMETER YOU CARRY  . isosorbide mononitrate (IMDUR) 30 MG 24 hr tablet Take 1 tablet (30 mg total) by mouth daily.  . lansoprazole (PREVACID) 15 MG capsule Take by mouth.  . levothyroxine (SYNTHROID, LEVOTHROID) 175 MCG tablet TAKE ONE TABLET BY MOUTH ONCE DAILY BEFORE BREAKFAST  . magnesium 30 MG tablet Take by mouth.  . metoCLOPramide (REGLAN) 10 MG tablet Take 1 tablet (10 mg total) by mouth 4 (four) times daily.  . nicotine (NICODERM CQ) 14 mg/24hr patch Place 1 patch (14 mg total) onto the skin daily.  . nitroGLYCERIN (NITROSTAT) 0.4 MG SL tablet Place  1 tablet (0.4 mg total) under the tongue every 5 (five) minutes as needed.  . ondansetron (ZOFRAN) 4 MG tablet Take 1 tab every 6 hours as needed for nausea.  . sitaGLIPtin (JANUVIA) 100 MG tablet Take 1 tablet (100 mg total) by mouth daily.  . traZODone (DESYREL) 100 MG tablet TAKE 1 TABLET (100 MG TOTAL) BY MOUTH AT BEDTIME.  Marland Kitchen zolpidem (AMBIEN) 10 MG tablet Take by mouth at bedtime as needed for sleep.   No current facility-administered medications on file prior to visit.   Past Medical History  Diagnosis Date  .  Diabetes mellitus   . Hypertension   . Hypothyroidism   . Depression   . Vascular dementia   . Tobacco abuse     a. 45 pack year history - quit 09/16/11  . Chronic low back pain   . Coronary artery disease     a. s/p PCI/DES LCX x 3;  b. NSTEMI 09/16/11;  c.   09/17/11 Cath [facility]) Severe RCA/LCX dzs - RCA treated, LCX intervention pending w/ Dr. Clayborn Bigness  . Sleep apnea     mild-does not use cpap  . Impingement syndrome of left shoulder   . Tendonitis of left rotator cuff   . Diverticulosis   . Myocardial infarction ([place])     x 5  . GERD (gastroesophageal reflux disease)   . Anginal pain ([place])     Review of Systems Per HPI unless specifically indicated in ROS section     Objective:    BP 128/82 mmHg  Pulse 64  Temp(Src) 98.1 F (36.7 C) (Oral)  Wt 145 lb (65.772 kg)  Wt Readings from Last 3 Encounters:  08/07/15 145 lb (65.772 kg)  06/18/15 148 lb (67.132 kg)  06/05/15 148 lb 8 oz (67.359 kg)    Physical Exam  Constitutional: She appears well-developed and well-nourished. No distress.  Abdominal: Soft. Bowel sounds are normal. She exhibits no distension and no mass. There is no hepatosplenomegaly. There is tenderness (mild) in the right lower quadrant and suprapubic area. There is CVA tenderness. There is no rigidity, no rebound, no guarding and negative Murphy's sign.  Musculoskeletal: She exhibits no edema.  Generalized tenderness to palpation lumbar spine without point tenderness  Skin: Skin is warm and dry. No rash noted.  Nursing note and vitals reviewed.  Results for orders placed or performed in visit on 08/07/15  POCT Urinalysis Dipstick (Automated)  Result Value Ref Range   Color, UA Yellow    Clarity, UA Clear    Glucose, UA 3+    Bilirubin, UA Negative    Ketones, UA Negative    Spec Grav, UA 1.020    Blood, UA Negative    pH, UA 6.0    Protein, UA Negative    Urobilinogen, UA 0.2    Nitrite, UA Negative    Leukocytes, UA Negative Negative    Micro: WBC 0 RBC rare Bact tr Casts none Epi 0    Assessment & Plan:   Problem List Items Addressed This Visit    Lower abdominal pain - Primary    No acute abdomen today. UA/micro with glucosuria but no evidence of infection. Concern for hyperglycemia leading to some of these symptoms  Further eval abdominal pain with labwork - CBC, CMP, lipase. Will update with results.       Relevant Orders   Comprehensive metabolic panel   CBC with Differential/Platelet   Lipase   Diabetes mellitus type 2 with peripheral artery disease ([place])  Concern for hyperglycemia accounting for several sxs today - further eval with labwork including lipase, CBC, CMP today. Advised push fluids, reviewed insulin dosing - pt thinks she received 2 lantus pens instead of novolog flexpen and has been taking her insulin incorrectly - will go directly to pharmacy from here to pick up novolog.  Has f/u with endo scheduled for next week.       Relevant Medications   insulin aspart (NOVOLOG FLEXPEN) 100 UNIT/ML FlexPen   Insulin Glargine (LANTUS SOLOSTAR) 100 UNIT/ML Solostar Pen   Other Relevant Orders   Comprehensive metabolic panel    Other Visit Diagnoses    Urine frequency        Relevant Orders    POCT Urinalysis Dipstick (Automated) (Completed)    Glucosuria        Relevant Orders    Comprehensive metabolic panel        Follow up plan: Return if symptoms worsen or fail to improve.

## 2015-08-08 ENCOUNTER — Encounter: Payer: Self-pay | Admitting: Cardiovascular Disease

## 2015-08-08 ENCOUNTER — Ambulatory Visit (INDEPENDENT_AMBULATORY_CARE_PROVIDER_SITE_OTHER): Payer: Medicare Other | Admitting: Cardiovascular Disease

## 2015-08-08 VITALS — BP 120/80 | HR 89 | Ht 66.5 in | Wt 142.0 lb

## 2015-08-08 DIAGNOSIS — R0602 Shortness of breath: Secondary | ICD-10-CM

## 2015-08-08 DIAGNOSIS — I208 Other forms of angina pectoris: Secondary | ICD-10-CM | POA: Diagnosis not present

## 2015-08-08 DIAGNOSIS — E1151 Type 2 diabetes mellitus with diabetic peripheral angiopathy without gangrene: Secondary | ICD-10-CM

## 2015-08-08 DIAGNOSIS — Z720 Tobacco use: Secondary | ICD-10-CM

## 2015-08-08 DIAGNOSIS — J449 Chronic obstructive pulmonary disease, unspecified: Secondary | ICD-10-CM

## 2015-08-08 DIAGNOSIS — M549 Dorsalgia, unspecified: Secondary | ICD-10-CM

## 2015-08-08 DIAGNOSIS — I5022 Chronic systolic (congestive) heart failure: Secondary | ICD-10-CM

## 2015-08-08 DIAGNOSIS — E785 Hyperlipidemia, unspecified: Secondary | ICD-10-CM

## 2015-08-08 MED ORDER — ATORVASTATIN CALCIUM 80 MG PO TABS: 80.0000 mg | ORAL_TABLET | Freq: Every day | ORAL | Status: DC

## 2015-08-08 MED ORDER — VARENICLINE TARTRATE 1 MG PO TABS: 1.0000 mg | ORAL_TABLET | Freq: Two times a day (BID) | ORAL | Status: DC

## 2015-08-08 MED ORDER — EZETIMIBE 10 MG PO TABS: 10.0000 mg | ORAL_TABLET | Freq: Every day | ORAL | Status: DC

## 2015-08-08 NOTE — Progress Notes (Signed)
Patient ID: Sarah Payne, female    DOB: May 29, 1946, 70 y.o.   MRN: [id number]  HPI Comments: 70 year old woman with a long history of smoking, coronary artery disease, prior stent x3 placed to the mid left circumflex with moderate to severe LAD disease , stent placed to her distal RCA in February 2013, stent to the mid left circumflex in March 2013 , poorly controlled diabetes with hemoglobin A1c of 10, depression, hypertension, cardiac catheterization 08/19/2012 showing moderate mid LAD disease at the takeoff of the diagonal vessel, severe ostial to mid RCA disease, ejection fraction greater than 55%. She had FFR pressure wire showing severe disease of the RCA with drug-eluting stent x2 placed. Recent C. difficile late 2015 after antibiotics Previous history of running out of her medications She presents today for follow-up of her coronary artery disease  In follow-up today, she denies any significant chest pain on exertion. She continues to smoke. She is interested in Chantix Diabetes poorly controlled, recent lab work showing glucose 300, managed by endocrinology. Has developed mid and lower back pain for the past 2 weeks Previous lab work reviewed with her showing total cholesterol 225. She has been compliant on Lipitor 40 mg daily.  EKG on today's visit shows normal sinus rhythm with rate 90 bpm, no significant ST or T-wave changes  Other past medical history  in the hospital January 12, discharged 08/02/2014. Diagnosed with unstable angina. Had cardiac catheterization showing severe stable coronary disease, Echocardiogram with normal ejection fraction Total cholesterol 292  Since her discharge, she denies any further chest pain. She reports that she is taking her medications. Recently been to diabetes instruction classes, scheduled to start insulin. She has been compliant with her statin per the patient She has developed a cough since her discharge from the hospital. She does not want  antibiotics given prior history of C. Difficile at the end of 2015  Details of the echocardiogram 08/01/2014; ejection fraction 55-60%, normal right ventricular systolic pressure, normal right heart systolic function Catheterization details, unchanged from prior catheterization. Lab report has not been signed off in the computer. Catheterization by Dr. Ellyn Hack. Medical management was recommended  Previous Lab work showing hemoglobin A1c 8.8, total cholesterol 287, LDL 174  Echocardiogram February 07, 2011 shows normal LV systolic function, diastolic dysfunction, moderate inferior wall hypokinesis, normal right ventricular systolic pressures.  Allergies  Allergen Reactions  . No Known Allergies     Outpatient Encounter Prescriptions as of 08/08/2015  Medication Sig  . aspirin 81 MG tablet Take 81 mg by mouth daily.    Marland Kitchen atorvastatin (LIPITOR) 80 MG tablet Take 1 tablet (80 mg total) by mouth daily.  . Calcium & Magnesium Carbonates (MYLANTA PO) Take by mouth.    . carvedilol (COREG) 3.125 MG tablet Take 2 tablets (6.25 mg total) by mouth 2 (two) times daily.  . clopidogrel (PLAVIX) 75 MG tablet TAKE 1 TABLET (75 MG TOTAL) BY MOUTH DAILY.  . cyanocobalamin (,VITAMIN B-12,) 1000 MCG/ML injection Inject 1 mL (1,000 mcg total) into the muscle every 30 (thirty) days. INJECT 1 ML (1,000 MCG TOTAL) INTO THE MUSCLE ONCE.  Marland Kitchen donepezil (ARICEPT) 10 MG tablet Take by mouth.  . DULoxetine (CYMBALTA) 60 MG capsule TAKE 1 CAPSULE BY MOUTH DAILY.  Marland Kitchen enalapril (VASOTEC) 5 MG tablet Take 1 tablet (5 mg total) by mouth daily.  Marland Kitchen glipiZIDE (GLUCOTROL) 10 MG tablet Before breakfast and supper (evening meal)  . glucose blood (FREESTYLE LITE) test strip Check blood sugar 3 times  daily to check blood sugars - Uncontrolled diabetes mellitus  . glucose monitoring kit (FREESTYLE) monitoring kit 1 each by Does not apply route as needed for other. PHARMACY PLEASE SUBSTITUTE WHATEVER GLUCOMETER YOU CARRY  . insulin aspart  (NOVOLOG FLEXPEN) 100 UNIT/ML FlexPen Inject 12 Units into the skin 3 (three) times daily with meals.  . Insulin Glargine (LANTUS SOLOSTAR) 100 UNIT/ML Solostar Pen Inject 40 Units into the skin as directed. Daily between 8PM and 10PM  . isosorbide mononitrate (IMDUR) 30 MG 24 hr tablet Take 1 tablet (30 mg total) by mouth daily.  . lansoprazole (PREVACID) 15 MG capsule Take by mouth.  . levothyroxine (SYNTHROID, LEVOTHROID) 175 MCG tablet TAKE ONE TABLET BY MOUTH ONCE DAILY BEFORE BREAKFAST  . magnesium 30 MG tablet Take by mouth.  . metoCLOPramide (REGLAN) 10 MG tablet Take 1 tablet (10 mg total) by mouth 4 (four) times daily.  . nicotine (NICODERM CQ) 14 mg/24hr patch Place 1 patch (14 mg total) onto the skin daily.  . nitroGLYCERIN (NITROSTAT) 0.4 MG SL tablet Place 1 tablet (0.4 mg total) under the tongue every 5 (five) minutes as needed.  . ondansetron (ZOFRAN) 4 MG tablet Take 1 tab every 6 hours as needed for nausea.  . sitaGLIPtin (JANUVIA) 100 MG tablet Take 1 tablet (100 mg total) by mouth daily.  . traZODone (DESYREL) 100 MG tablet TAKE 1 TABLET (100 MG TOTAL) BY MOUTH AT BEDTIME.  Marland Kitchen zolpidem (AMBIEN) 10 MG tablet Take by mouth at bedtime as needed for sleep.  . [DISCONTINUED] atorvastatin (LIPITOR) 40 MG tablet Take 1 tablet (40 mg total) by mouth daily.  Marland Kitchen ezetimibe (ZETIA) 10 MG tablet Take 1 tablet (10 mg total) by mouth daily.  . varenicline (CHANTIX CONTINUING MONTH PAK) 1 MG tablet Take 1 tablet (1 mg total) by mouth 2 (two) times daily.   No facility-administered encounter medications on file as of 08/08/2015.    Past Medical History  Diagnosis Date  . Diabetes mellitus   . Hypertension   . Hypothyroidism   . Depression   . Vascular dementia   . Tobacco abuse     a. 45 pack year history - quit 09/16/11  . Chronic low back pain   . Coronary artery disease     a. s/p PCI/DES LCX x 3;  b. NSTEMI 09/16/11;  c.   09/17/11 Cath [facility]) Severe RCA/LCX dzs - RCA treated, LCX  intervention pending w/ Dr. Clayborn Bigness  . Sleep apnea     mild-does not use cpap  . Impingement syndrome of left shoulder   . Tendonitis of left rotator cuff   . Diverticulosis   . Myocardial infarction ([place])     x 5  . GERD (gastroesophageal reflux disease)   . Anginal pain [facility])     Past Surgical History  Procedure Laterality Date  . Back surgery    . Appendectomy    . Abdominal hysterectomy    . Colonoscopy N/A 11/02/2014    Procedure: COLONOSCOPY;  Surgeon: Inda Castle, MD;  Location: [place];  Service: Endoscopy;  Laterality: N/A;  . Cataract extraction w/phaco Left 01/30/2015    Procedure: CATARACT EXTRACTION PHACO AND INTRAOCULAR LENS PLACEMENT (IOC);  Surgeon: Birder Robson, MD;  Location: [facility];  Service: Ophthalmology;  Laterality: Left;  [place] 00:47   . Cardiac catheterization  2013  . Coronary angioplasty  2012    stent x 3   . Coronary angioplasty with stent placement  2013  . Cardiac catheterization  2016  . Cataract extraction w/phaco Right 02/13/2015    Procedure: CATARACT EXTRACTION PHACO AND INTRAOCULAR LENS PLACEMENT (IOC);  Surgeon: Birder Robson, MD;  Location: [facility];  Service: Ophthalmology;  Laterality: Right;  cassette lot # [id number] H [place]  00:29.9 AP  20.7 CDE  6.20    Social History  reports that she has been smoking Cigarettes.  She has a 11.25 pack-year smoking history. She has never used smokeless tobacco. She reports that she uses illicit drugs (Marijuana). She reports that she does not drink alcohol.  Family History family history includes Colon cancer in her sister; Heart attack in her mother; Heart disease in her father and mother; Liver cancer in her brother; Throat cancer in her brother.   Review of Systems  Constitutional: Negative.   Respiratory: Negative.   Cardiovascular: Negative.   Gastrointestinal: Negative.   Endocrine: Negative.   Musculoskeletal: Negative.   Neurological: Negative.   Hematological: Negative.    Psychiatric/Behavioral: Negative.   All other systems reviewed and are negative.  BP 120/80 mmHg  Pulse 89  Ht 5' 6.5" (1.689 m)  Wt 142 lb (64.411 kg)  BMI 22.58 kg/m2  Physical Exam  Constitutional: She is oriented to person, place, and time. She appears well-developed and well-nourished.  HENT:  Head: Normocephalic.  Nose: Nose normal.  Mouth/Throat: Oropharynx is clear and moist.  Eyes: Conjunctivae are normal. Pupils are equal, round, and reactive to light.  Neck: Normal range of motion. Neck supple. No JVD present.  Cardiovascular: Normal rate, regular rhythm, S1 normal, S2 normal, normal heart sounds and intact distal pulses.  Exam reveals no gallop and no friction rub.   No murmur heard. Pulmonary/Chest: Effort normal and breath sounds normal. No respiratory distress. She has no wheezes. She has no rales. She exhibits no tenderness.  Abdominal: Soft. Bowel sounds are normal. She exhibits no distension. There is no tenderness.  Musculoskeletal: Normal range of motion. She exhibits no edema or tenderness.  Lymphadenopathy:    She has no cervical adenopathy.  Neurological: She is alert and oriented to person, place, and time. Coordination normal.  Skin: Skin is warm and dry. No rash noted. No erythema.  Psychiatric: She has a normal mood and affect. Her behavior is normal. Judgment and thought content normal.    Assessment and Plan  Nursing note and vitals reviewed.

## 2015-08-08 NOTE — Assessment & Plan Note (Signed)
Recommended she continue on her carvedilol, enalapril , Imdur  Appears relatively euvolemic

## 2015-08-08 NOTE — Assessment & Plan Note (Signed)
We have provided a prescription for Chantix  Long discussion concerning other options for smoking cessation

## 2015-08-08 NOTE — Assessment & Plan Note (Signed)
She has close follow-up with endocrinology Reports sugars have been very elevated recently We have encouraged continued exercise, careful diet management

## 2015-08-08 NOTE — Patient Instructions (Signed)
You are doing well.  Please increase the lipitor up to 80 mg daily Please start zetia one a day  Start chantix slowly by 1/2 doses up to twice a day over a few weeks  Please call us if you have new issues that need to be addressed before your next appt.  Your physician wants you to follow-up in: 6 months.  You will receive a reminder letter in the mail two months in advance. If you don't receive a letter, please call our office to schedule the follow-up appointment.

## 2015-08-08 NOTE — Assessment & Plan Note (Signed)
We have recommended she increase Lipitor up to 80 mg daily, we will also start zetia  10 mg daily  recheck lipid panel in several months time, goal LDL less than 70  Numbers may improve as diabetes numbers decrease

## 2015-08-08 NOTE — Assessment & Plan Note (Signed)
She currently denies any symptoms concerning for angina No further ischemia workup at this time Smoking cessation recommended

## 2015-08-08 NOTE — Assessment & Plan Note (Signed)
Smoking cessation recommended  currently not on inhalers

## 2015-08-10 ENCOUNTER — Encounter: Payer: Self-pay | Admitting: Family Medicine

## 2015-08-10 ENCOUNTER — Ambulatory Visit (INDEPENDENT_AMBULATORY_CARE_PROVIDER_SITE_OTHER)
Admission: RE | Admit: 2015-08-10 | Discharge: 2015-08-10 | Disposition: A | Discharge status: Home / Self Care | Payer: Medicare Other | Source: Ambulatory Visit | Attending: Family Medicine | Admitting: Family Medicine

## 2015-08-10 ENCOUNTER — Ambulatory Visit (INDEPENDENT_AMBULATORY_CARE_PROVIDER_SITE_OTHER): Payer: Medicare Other | Admitting: Family Medicine

## 2015-08-10 VITALS — BP 110/64 | HR 68 | Temp 97.8°F | Wt 146.0 lb

## 2015-08-10 DIAGNOSIS — R1030 Lower abdominal pain, unspecified: Secondary | ICD-10-CM | POA: Diagnosis not present

## 2015-08-10 DIAGNOSIS — M545 Low back pain, unspecified: Secondary | ICD-10-CM

## 2015-08-10 DIAGNOSIS — I208 Other forms of angina pectoris: Secondary | ICD-10-CM

## 2015-08-10 DIAGNOSIS — E1151 Type 2 diabetes mellitus with diabetic peripheral angiopathy without gangrene: Secondary | ICD-10-CM | POA: Diagnosis not present

## 2015-08-10 DIAGNOSIS — M4327 Fusion of spine, lumbosacral region: Secondary | ICD-10-CM | POA: Diagnosis not present

## 2015-08-10 NOTE — Assessment & Plan Note (Signed)
Reviewed insulin regimen. Endorses low sugars after 14u mealtime novolog. Advised she was prescribed 12 u novolog with meals but if hypoglycemia to decrease mealtime insulin until endo appt on Wednesday.

## 2015-08-10 NOTE — Addendum Note (Signed)
Addended by: Ria Bush on: 08/10/2015 11:52 AM   Modules accepted: Level of Service

## 2015-08-10 NOTE — Assessment & Plan Note (Signed)
Update lumbar films today given back pain + h/o back surgeries.

## 2015-08-10 NOTE — Assessment & Plan Note (Addendum)
She does not have acute abdomen today. Persistent lower abd pain > lower back pain today, new symptom of loose stools and chills in significant h/o C diff infection last year - will recheck this today. Pt agrees.

## 2015-08-10 NOTE — Progress Notes (Signed)
Pre visit review using our clinic review tool, if applicable. No additional management support is needed unless otherwise documented below in the visit note. 

## 2015-08-10 NOTE — Patient Instructions (Addendum)
Lower back xray today Pass by lab for stool test for C diff. We will be in touch with results.

## 2015-08-10 NOTE — Progress Notes (Signed)
BP 110/64 mmHg  Pulse 68  Temp(Src) 97.8 F (36.6 C) (Oral)  Wt 146 lb (66.225 kg)   CC: f/u visit  Subjective:    Patient ID: Sarah Payne, female    DOB: [birth year], 70 y.o.   MRN: [id number]  HPI: Sarah Payne is a 70 y.o. female presenting on 08/10/2015 for Follow-up   See prior note for details - briefly, seen here with 2-3 wk h/o lower abd and lower back pain described as dull cramping ache. Labwork stable. UA negative for infection. Concern for symptoms from hyperglycemia as she was not taking insulin prescribed by endo appropriately. Since then, has started taking appropriate insulin regimen of lantus 40u nightly and novolog 12 u TID AC. Brings log of sugars over last 2 days - 88-296. Has been prescribed trulicity.   Lower back has improved some. Persistent lower abdominal pain and now bowels have loosened. Loose to watery stools that started yesterday. Has had 1 episode of loose stool today. Bad day yesterday - even had accident at [facility]. Possible chill yesterday.   No blood in stool, no fevers. No new restaurants, new foods, no sick contacts at home.   H/o prolonged C diff infection last year that lasted 7 months - worried about recurrence.   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  Continued smoker.  Relevant past medical, surgical, family and social history reviewed and updated as indicated. Interim medical history since our last visit reviewed. Allergies and medications reviewed and updated. Current Outpatient Prescriptions on File Prior to Visit  Medication Sig  . aspirin 81 MG tablet Take 81 mg by mouth daily.    Marland Kitchen atorvastatin (LIPITOR) 80 MG tablet Take 1 tablet (80 mg total) by mouth daily.  . Calcium & Magnesium Carbonates (MYLANTA PO) Take by mouth.    . carvedilol (COREG) 3.125 MG tablet Take 2 tablets (6.25 mg total) by mouth 2 (two) times daily.  . clopidogrel (PLAVIX) 75 MG tablet TAKE 1 TABLET (75 MG TOTAL) BY MOUTH DAILY.  . cyanocobalamin (,VITAMIN  B-12,) 1000 MCG/ML injection Inject 1 mL (1,000 mcg total) into the muscle every 30 (thirty) days. INJECT 1 ML (1,000 MCG TOTAL) INTO THE MUSCLE ONCE.  Marland Kitchen donepezil (ARICEPT) 10 MG tablet Take by mouth.  . DULoxetine (CYMBALTA) 60 MG capsule TAKE 1 CAPSULE BY MOUTH DAILY.  Marland Kitchen enalapril (VASOTEC) 5 MG tablet Take 1 tablet (5 mg total) by mouth daily.  Marland Kitchen ezetimibe (ZETIA) 10 MG tablet Take 1 tablet (10 mg total) by mouth daily.  Marland Kitchen glipiZIDE (GLUCOTROL) 10 MG tablet Before breakfast and supper (evening meal)  . glucose blood (FREESTYLE LITE) test strip Check blood sugar 3 times daily to check blood sugars - Uncontrolled diabetes mellitus  . glucose monitoring kit (FREESTYLE) monitoring kit 1 each by Does not apply route as needed for other. PHARMACY PLEASE SUBSTITUTE WHATEVER GLUCOMETER YOU CARRY  . insulin aspart (NOVOLOG FLEXPEN) 100 UNIT/ML FlexPen Inject 12 Units into the skin 3 (three) times daily with meals.  . Insulin Glargine (LANTUS SOLOSTAR) 100 UNIT/ML Solostar Pen Inject 40 Units into the skin as directed. Daily between 8PM and 10PM  . isosorbide mononitrate (IMDUR) 30 MG 24 hr tablet Take 1 tablet (30 mg total) by mouth daily.  . lansoprazole (PREVACID) 15 MG capsule Take by mouth.  . levothyroxine (SYNTHROID, LEVOTHROID) 175 MCG tablet TAKE ONE TABLET BY MOUTH ONCE DAILY BEFORE BREAKFAST  . magnesium 30 MG tablet Take by mouth.  . metoCLOPramide (REGLAN)  10 MG tablet Take 1 tablet (10 mg total) by mouth 4 (four) times daily.  . nicotine (NICODERM CQ) 14 mg/24hr patch Place 1 patch (14 mg total) onto the skin daily.  . nitroGLYCERIN (NITROSTAT) 0.4 MG SL tablet Place 1 tablet (0.4 mg total) under the tongue every 5 (five) minutes as needed.  . ondansetron (ZOFRAN) 4 MG tablet Take 1 tab every 6 hours as needed for nausea.  . sitaGLIPtin (JANUVIA) 100 MG tablet Take 1 tablet (100 mg total) by mouth daily.  . traZODone (DESYREL) 100 MG tablet TAKE 1 TABLET (100 MG TOTAL) BY MOUTH AT BEDTIME.   . varenicline (CHANTIX CONTINUING MONTH PAK) 1 MG tablet Take 1 tablet (1 mg total) by mouth 2 (two) times daily.  Marland Kitchen zolpidem (AMBIEN) 10 MG tablet Take by mouth at bedtime as needed for sleep.   No current facility-administered medications on file prior to visit.    Review of Systems Per HPI unless specifically indicated in ROS section     Objective:    BP 110/64 mmHg  Pulse 68  Temp(Src) 97.8 F (36.6 C) (Oral)  Wt 146 lb (66.225 kg)  Wt Readings from Last 3 Encounters:  08/10/15 146 lb (66.225 kg)  08/08/15 142 lb (64.411 kg)  08/07/15 145 lb (65.772 kg)    Physical Exam  Constitutional: She appears well-developed and well-nourished. No distress.  HENT:  Mouth/Throat: Oropharynx is clear and moist. No oropharyngeal exudate.  Cardiovascular: Normal rate, regular rhythm, normal heart sounds and intact distal pulses.   No murmur heard. Pulmonary/Chest: Effort normal and breath sounds normal. No respiratory distress. She has no wheezes. She has no rales.  Abdominal: Soft. Normal appearance and bowel sounds are normal. She exhibits no distension and no mass. There is no hepatosplenomegaly. There is tenderness (mild) in the right lower quadrant, epigastric area and left lower quadrant. There is no rigidity, no rebound, no guarding, no CVA tenderness and negative Murphy's sign.  Musculoskeletal: She exhibits no edema.  + pain lower lumbar midline spine pain No paraspinous mm tenderness  Skin: Skin is warm and dry. No rash noted.  Nursing note and vitals reviewed.  Results for orders placed or performed in visit on 08/07/15  Comprehensive metabolic panel  Result Value Ref Range   Sodium 137 135 - 145 mEq/L   Potassium 3.9 3.5 - 5.1 mEq/L   Chloride 101 96 - 112 mEq/L   CO2 29 19 - 32 mEq/L   Glucose, Bld 304 (H) 70 - 99 mg/dL   BUN 14 6 - 23 mg/dL   Creatinine, Ser 1.00 0.40 - 1.20 mg/dL   Total Bilirubin 0.3 0.2 - 1.2 mg/dL   Alkaline Phosphatase 87 39 - 117 U/L   AST 8  0 - 37 U/L   ALT 7 0 - 35 U/L   Total Protein 7.0 6.0 - 8.3 g/dL   Albumin 3.8 3.5 - 5.2 g/dL   Calcium 9.6 8.4 - 10.5 mg/dL   GFR 58.70 (L) >60.00 mL/min  CBC with Differential/Platelet  Result Value Ref Range   WBC 8.4 4.0 - 10.5 K/uL   RBC 4.33 3.87 - 5.11 Mil/uL   Hemoglobin 13.2 12.0 - 15.0 g/dL   HCT 39.8 36.0 - 46.0 %   MCV 91.8 78.0 - 100.0 fl   MCHC 33.2 30.0 - 36.0 g/dL   RDW 13.4 11.5 - 15.5 %   Platelets 273.0 150.0 - 400.0 K/uL   Neutrophils Relative % 64.7 43.0 - 77.0 %  Lymphocytes Relative 26.4 12.0 - 46.0 %   Monocytes Relative 7.7 3.0 - 12.0 %   Eosinophils Relative 0.8 0.0 - 5.0 %   Basophils Relative 0.4 0.0 - 3.0 %   Neutro Abs 5.4 1.4 - 7.7 K/uL   Lymphs Abs 2.2 0.7 - 4.0 K/uL   Monocytes Absolute 0.6 0.1 - 1.0 K/uL   Eosinophils Absolute 0.1 0.0 - 0.7 K/uL   Basophils Absolute 0.0 0.0 - 0.1 K/uL  Lipase  Result Value Ref Range   Lipase 53.0 11.0 - 59.0 U/L  POCT Urinalysis Dipstick (Automated)  Result Value Ref Range   Color, UA Yellow    Clarity, UA Clear    Glucose, UA 3+    Bilirubin, UA Negative    Ketones, UA Negative    Spec Grav, UA 1.020    Blood, UA Negative    pH, UA 6.0    Protein, UA Negative    Urobilinogen, UA 0.2    Nitrite, UA Negative    Leukocytes, UA Negative Negative      Assessment & Plan:   Problem List Items Addressed This Visit    Midline low back pain without sciatica    Update lumbar films today given back pain + h/o back surgeries.       Relevant Orders   DG Lumbar Spine Complete   Lower abdominal pain - Primary    She does not have acute abdomen today. Persistent lower abd pain > lower back pain today, new symptom of loose stools and chills in significant h/o C diff infection last year - will recheck this today. Pt agrees.       Relevant Orders   C. difficile GDH and Toxin A/B   Diabetes mellitus type 2 with peripheral artery disease ([place])    Reviewed insulin regimen. Endorses low sugars after 14u  mealtime novolog. Advised she was prescribed 12 u novolog with meals but if hypoglycemia to decrease mealtime insulin until endo appt on Wednesday.          Follow up plan: Return if symptoms worsen or fail to improve.

## 2015-08-13 DIAGNOSIS — R1030 Lower abdominal pain, unspecified: Secondary | ICD-10-CM | POA: Diagnosis not present

## 2015-08-13 DIAGNOSIS — R109 Unspecified abdominal pain: Secondary | ICD-10-CM | POA: Diagnosis not present

## 2015-08-14 LAB — C. DIFFICILE GDH AND TOXIN A/B
C. difficile GDH: NOT DETECTED
C. difficile Toxin A/B: NOT DETECTED

## 2015-08-17 ENCOUNTER — Ambulatory Visit
Admission: RE | Admit: 2015-08-17 | Discharge: 2015-08-17 | Disposition: A | Discharge status: Home / Self Care | Payer: Medicare Other | Source: Ambulatory Visit | Attending: Family Medicine | Admitting: Family Medicine

## 2015-08-17 ENCOUNTER — Other Ambulatory Visit: Payer: Self-pay | Admitting: Family Medicine

## 2015-08-17 DIAGNOSIS — N281 Cyst of kidney, acquired: Secondary | ICD-10-CM | POA: Insufficient documentation

## 2015-08-17 DIAGNOSIS — R1030 Lower abdominal pain, unspecified: Secondary | ICD-10-CM

## 2015-08-17 DIAGNOSIS — I2510 Atherosclerotic heart disease of native coronary artery without angina pectoris: Secondary | ICD-10-CM | POA: Insufficient documentation

## 2015-08-17 DIAGNOSIS — R197 Diarrhea, unspecified: Secondary | ICD-10-CM

## 2015-08-17 DIAGNOSIS — R109 Unspecified abdominal pain: Secondary | ICD-10-CM | POA: Diagnosis not present

## 2015-08-17 DIAGNOSIS — K5730 Diverticulosis of large intestine without perforation or abscess without bleeding: Secondary | ICD-10-CM | POA: Insufficient documentation

## 2015-08-17 DIAGNOSIS — I700 Atherosclerosis of aorta: Secondary | ICD-10-CM | POA: Diagnosis not present

## 2015-08-17 MED ORDER — IOHEXOL 300 MG/ML  SOLN
100.0000 mL | Freq: Once | INTRAMUSCULAR | Status: AC | PRN
  Administered 2015-08-17: 100 mL via INTRAVENOUS

## 2015-08-20 ENCOUNTER — Other Ambulatory Visit: Payer: Self-pay | Admitting: Family Medicine

## 2015-08-20 MED ORDER — METRONIDAZOLE 500 MG PO TABS: 500.0000 mg | ORAL_TABLET | Freq: Three times a day (TID) | ORAL | Status: DC

## 2015-08-20 MED ORDER — CIPROFLOXACIN HCL 500 MG PO TABS: 500.0000 mg | ORAL_TABLET | Freq: Two times a day (BID) | ORAL | Status: DC

## 2015-08-21 ENCOUNTER — Telehealth: Payer: Self-pay | Admitting: Family Medicine

## 2015-08-21 NOTE — Telephone Encounter (Signed)
Patient returned Kim's call. °

## 2015-08-21 NOTE — Telephone Encounter (Signed)
Spoke with patient.

## 2015-08-27 ENCOUNTER — Telehealth: Payer: Self-pay

## 2015-08-27 DIAGNOSIS — K529 Noninfective gastroenteritis and colitis, unspecified: Secondary | ICD-10-CM

## 2015-08-27 DIAGNOSIS — R1030 Lower abdominal pain, unspecified: Secondary | ICD-10-CM

## 2015-08-27 NOTE — Telephone Encounter (Signed)
Patient notified and verbalized understanding. 

## 2015-08-27 NOTE — Telephone Encounter (Signed)
Pt has been on abx for 6 days and does not feel better; pt states she cannot eat anything;pt can keep pepsi down; last vomited 08/25/15; last 24 hours watery diarrhea x 2. Urinating normally. No fever. Please see CT abd result note. [facility] [address]. Pt request cb.

## 2015-08-27 NOTE — Telephone Encounter (Signed)
referral placed back to GI.  Will try and expedite. If fever,recurrent vomiting, worsening abd pain or unable to keep liquids down, please seek ER care.

## 2015-08-28 ENCOUNTER — Encounter: Payer: Self-pay | Admitting: Gastroenterology

## 2015-08-28 ENCOUNTER — Ambulatory Visit (INDEPENDENT_AMBULATORY_CARE_PROVIDER_SITE_OTHER): Payer: Medicare Other | Admitting: Gastroenterology

## 2015-08-28 VITALS — BP 150/90 | HR 98 | Ht 66.5 in | Wt 137.0 lb

## 2015-08-28 DIAGNOSIS — I208 Other forms of angina pectoris: Secondary | ICD-10-CM

## 2015-08-28 DIAGNOSIS — R197 Diarrhea, unspecified: Secondary | ICD-10-CM | POA: Diagnosis not present

## 2015-08-28 DIAGNOSIS — R110 Nausea: Secondary | ICD-10-CM | POA: Diagnosis not present

## 2015-08-28 MED ORDER — ONDANSETRON HCL 4 MG PO TABS: ORAL_TABLET | ORAL | Status: DC

## 2015-08-28 NOTE — Telephone Encounter (Signed)
Appt made with [facility] and patient aware. Appt 08/28/15 with GI P.A.

## 2015-08-28 NOTE — Patient Instructions (Signed)
Your physician has requested that you go to the basement for  lab work before leaving today. Clear liquid diet advanced to bland diet as tolerated. We have sent the following medications to your pharmacy for you to pick up at your convenience: Zofran

## 2015-09-05 ENCOUNTER — Telehealth: Payer: Self-pay | Admitting: Gastroenterology

## 2015-09-05 NOTE — Telephone Encounter (Signed)
Left a message for patient to call back. 

## 2015-09-05 NOTE — Telephone Encounter (Signed)
Patient states she did not know she needed to have stool studies or labs. She states she is still having diarrhea and vomiting. She will come to lab today to get kit and do labs.

## 2015-09-07 ENCOUNTER — Other Ambulatory Visit (INDEPENDENT_AMBULATORY_CARE_PROVIDER_SITE_OTHER): Payer: Medicare Other

## 2015-09-07 DIAGNOSIS — R110 Nausea: Secondary | ICD-10-CM | POA: Diagnosis not present

## 2015-09-07 DIAGNOSIS — R197 Diarrhea, unspecified: Secondary | ICD-10-CM

## 2015-09-07 LAB — IGA: IgA: 231 mg/dL (ref 68–378)

## 2015-09-08 LAB — CLOSTRIDIUM DIFFICILE BY PCR: Toxigenic C. Difficile by PCR: NOT DETECTED

## 2015-09-08 LAB — FECAL LACTOFERRIN, QUANT: Lactoferrin: NEGATIVE

## 2015-09-11 ENCOUNTER — Encounter: Payer: Self-pay | Admitting: Gastroenterology

## 2015-09-11 DIAGNOSIS — R197 Diarrhea, unspecified: Secondary | ICD-10-CM | POA: Insufficient documentation

## 2015-09-11 DIAGNOSIS — R110 Nausea: Secondary | ICD-10-CM | POA: Insufficient documentation

## 2015-09-11 LAB — TISSUE TRANSGLUTAMINASE, IGA: Tissue Transglutaminase Ab, IgA: 1 U/mL (ref <4)

## 2015-09-11 NOTE — Progress Notes (Signed)
     08/28/2015 Sarah Payne [id number] 10/13/1945   History of Present Illness:  This is a 70 year old female who was previously known to Dr. Deatra Ina.  Will be followed by Dr. Silverio Decamp in his absence.  Has recurrent complaints of nausea, vomiting, and diarrhea.  Did have [place] in 05/2014.  Returns to our office today with the same complaints.  Says that for the past 3 weeks she had these symptoms.  Even had stool incontinence at times.  Currently she is feeling better just since yesterday.  Her appetite was poor throughout this as well and she says that she has not eaten much.  Was also having some lower abdominal pain that radiated around to her back.  CT scan 08/17/15 showed "Long segment of mild symmetric descending and sigmoid wall thickening of the background of diverticulosis, likely representing colitis, which may be infectious, inflammatory, or ischemic.  No evidence of focal diverticulitis, perforation, or abscess formation."  Saw her PCP and was started on Cipro and Flagyl on 1/30 empirically for 10 days course to treat infectious source.  Cdiff was negative.  Last colonoscopy 10/2014 with some polyps (tubular adenomas), diverticulosis, and internal hemorrhoids.  Random biopsies normal.   Current Medications, Allergies, Past Medical History, Past Surgical History, Family History and Social History were reviewed in [employer] record.   Physical Exam: BP 150/90 mmHg  Pulse 98  Ht 5' 6.5" (1.689 m)  Wt 137 lb (62.143 kg)  BMI 21.78 kg/m2 General: Well developed white female in no acute distress Head: Normocephalic and atraumatic Eyes:  Sclerae anicteric, conjunctiva pink  Ears: Normal auditory acuity Lungs: Clear throughout to auscultation Heart: Regular rate and rhythm Abdomen: Soft, non-distended.  Normal bowel sounds.  Non-tender. Musculoskeletal: Symmetrical with no gross deformities  Extremities: No edema  Neurological: Alert oriented x 4, grossly  non-focal Psychological:  Alert and cooperative. Normal mood and affect  Assessment and Recommendations: -Nausea, vomiting, and diarrhea:  Symptoms resolved at the point.  Certainly sounds infectious and CT scan showing segmental colitis (suspect infectious over inflammatory and ischemic), but was somewhat prolonged.  She's also been seen a number of other times over the past year in our office for these same complaints, mostly diarrhea.  She did have Cdiff in 05/2014, but that was negative recently. Will check celiac labs.  Also, if diarrhea returns then will check stool studies for fecal elastase and O&P.  Will give Zofran to keep on-hand for nausea.  Complete course of cipro and flagyl that was given to her by her PCP.

## 2015-09-12 NOTE — Progress Notes (Signed)
Reviewed and agree with documentation and assessment and plan. K. Veena Stacy Sailer , MD   

## 2015-09-20 ENCOUNTER — Encounter: Payer: Self-pay | Admitting: Family Medicine

## 2015-09-20 ENCOUNTER — Other Ambulatory Visit: Payer: Self-pay | Admitting: Family Medicine

## 2015-09-20 ENCOUNTER — Ambulatory Visit (INDEPENDENT_AMBULATORY_CARE_PROVIDER_SITE_OTHER): Payer: Medicare Other | Admitting: Family Medicine

## 2015-09-20 VITALS — BP 160/92 | HR 74 | Temp 98.4°F | Wt 143.5 lb

## 2015-09-20 DIAGNOSIS — I10 Essential (primary) hypertension: Secondary | ICD-10-CM | POA: Diagnosis not present

## 2015-09-20 DIAGNOSIS — E1151 Type 2 diabetes mellitus with diabetic peripheral angiopathy without gangrene: Secondary | ICD-10-CM | POA: Diagnosis not present

## 2015-09-20 DIAGNOSIS — E038 Other specified hypothyroidism: Secondary | ICD-10-CM | POA: Diagnosis not present

## 2015-09-20 DIAGNOSIS — E785 Hyperlipidemia, unspecified: Secondary | ICD-10-CM

## 2015-09-20 DIAGNOSIS — I208 Other forms of angina pectoris: Secondary | ICD-10-CM

## 2015-09-20 DIAGNOSIS — Z794 Long term (current) use of insulin: Principal | ICD-10-CM

## 2015-09-20 DIAGNOSIS — E039 Hypothyroidism, unspecified: Secondary | ICD-10-CM

## 2015-09-20 DIAGNOSIS — E1140 Type 2 diabetes mellitus with diabetic neuropathy, unspecified: Secondary | ICD-10-CM

## 2015-09-20 LAB — COMPREHENSIVE METABOLIC PANEL
ALT: 9 U/L (ref 0–35)
AST: 11 U/L (ref 0–37)
Albumin: 4.1 g/dL (ref 3.5–5.2)
Alkaline Phosphatase: 90 U/L (ref 39–117)
BUN: 14 mg/dL (ref 6–23)
CO2: 29 mEq/L (ref 19–32)
Calcium: 9.6 mg/dL (ref 8.4–10.5)
Chloride: 96 mEq/L (ref 96–112)
Creatinine, Ser: 1.06 mg/dL (ref 0.40–1.20)
GFR: 54.87 mL/min — ABNORMAL LOW (ref >60.00)
Glucose, Bld: 313 mg/dL — ABNORMAL HIGH (ref 70–99)
Potassium: 3.8 mEq/L (ref 3.5–5.1)
Sodium: 132 mEq/L — ABNORMAL LOW (ref 135–145)
Total Bilirubin: 0.3 mg/dL (ref 0.2–1.2)
Total Protein: 7 g/dL (ref 6.0–8.3)

## 2015-09-20 LAB — LIPID PANEL
Cholesterol: 339 mg/dL — ABNORMAL HIGH (ref 0–200)
HDL: 57.1 mg/dL (ref >39.00)
NonHDL: 281.93
Total CHOL/HDL Ratio: 6
Triglycerides: 224 mg/dL — ABNORMAL HIGH (ref 0.0–149.0)
VLDL: 44.8 mg/dL — ABNORMAL HIGH (ref 0.0–40.0)

## 2015-09-20 LAB — TSH: TSH: 51.35 u[IU]/mL — ABNORMAL HIGH (ref 0.35–4.50)

## 2015-09-20 LAB — LDL CHOLESTEROL, DIRECT: Direct LDL: 237 mg/dL

## 2015-09-20 LAB — T4, FREE: Free T4: 0.27 ng/dL — ABNORMAL LOW (ref 0.60–1.60)

## 2015-09-20 LAB — HEMOGLOBIN A1C: Hgb A1c MFr Bld: 10.8 % — ABNORMAL HIGH (ref 4.6–6.5)

## 2015-09-20 NOTE — Progress Notes (Addendum)
70 yo pleasant female with h/o CAD, COPD, poorly controlled DM, HLD and hypothyroidism here for follow up of chronic medical conditions.   Uncontrolled type 2 diabetes - was followed by endocrinology at [facility].   Currently taking lantus dose to 30 units in PM and advised to continue to increase dose 3 units every 3 days until fasting FSBS less than 130.  Advised to check FSBS twice daily- FSBS having been around 130 fasting. Currently taking Lantus 20 units every morning and 30 units at bedtime. Per pt, "they dropped me because I missed two appointments."  Lab Results  Component Value Date   HGBA1C 8.6* 01/04/2015    CAD- last saw Dr. Rockey Situ on 08/08/15.  Note reviewed. Advised to increase Lipitor to 80 mg daily and added Zetia.  LDL goal less than 70.   Lab Results  Component Value Date   CHOL 225* 03/12/2015   HDL 57.20 03/12/2015   LDLCALC 146* 03/12/2015   LDLDIRECT 174.5 04/26/2014   TRIG 109.0 03/12/2015   CHOLHDL 4 03/12/2015   Lab Results  Component Value Date   ALT 7 08/07/2015   AST 8 08/07/2015   ALKPHOS 87 08/07/2015   BILITOT 0.3 08/07/2015   Hypothyroidism- currently taking synthroid 175 mcg daily.  BP very elevated today- has not been taking her blood pressure medication for over a week.   Had diarrhea last week. Those symptoms have improved.  Denies HA, blurred vision, CP or SOB.  Lab Results  Component Value Date   TSH 0.52 06/18/2015   Current Outpatient Prescriptions on File Prior to Visit  Medication Sig Dispense Refill  . aspirin 81 MG tablet Take 81 mg by mouth daily.      Marland Kitchen atorvastatin (LIPITOR) 80 MG tablet Take 1 tablet (80 mg total) by mouth daily. 90 tablet 3  . Calcium & Magnesium Carbonates (MYLANTA PO) Take by mouth.      . carvedilol (COREG) 3.125 MG tablet Take 2 tablets (6.25 mg total) by mouth 2 (two) times daily. 180 tablet 3  . clopidogrel (PLAVIX) 75 MG tablet TAKE 1 TABLET (75 MG TOTAL) BY MOUTH DAILY. 90 tablet 3  . DULoxetine  (CYMBALTA) 60 MG capsule TAKE 1 CAPSULE BY MOUTH DAILY. 90 capsule 3  . enalapril (VASOTEC) 5 MG tablet Take 1 tablet (5 mg total) by mouth daily. 90 tablet 3  . ezetimibe (ZETIA) 10 MG tablet Take 1 tablet (10 mg total) by mouth daily. 90 tablet 3  . glipiZIDE (GLUCOTROL) 10 MG tablet Before breakfast and supper (evening meal) 180 tablet 1  . glucose blood (FREESTYLE LITE) test strip Check blood sugar 3 times daily to check blood sugars - Uncontrolled diabetes mellitus 300 each 3  . glucose monitoring kit (FREESTYLE) monitoring kit 1 each by Does not apply route as needed for other. PHARMACY PLEASE SUBSTITUTE WHATEVER GLUCOMETER YOU CARRY 1 each 1  . insulin aspart (NOVOLOG FLEXPEN) 100 UNIT/ML FlexPen Inject 12 Units into the skin 3 (three) times daily with meals.    . Insulin Glargine (LANTUS SOLOSTAR) 100 UNIT/ML Solostar Pen Inject 40 Units into the skin as directed. Daily between 8PM and 10PM    . isosorbide mononitrate (IMDUR) 30 MG 24 hr tablet Take 1 tablet (30 mg total) by mouth daily. 90 tablet 3  . lansoprazole (PREVACID) 15 MG capsule Take by mouth.    . levothyroxine (SYNTHROID, LEVOTHROID) 175 MCG tablet TAKE ONE TABLET BY MOUTH ONCE DAILY BEFORE BREAKFAST 30 tablet 3  . metoCLOPramide (REGLAN)  10 MG tablet Take 1 tablet (10 mg total) by mouth 4 (four) times daily. 90 tablet 0  . nitroGLYCERIN (NITROSTAT) 0.4 MG SL tablet Place 1 tablet (0.4 mg total) under the tongue every 5 (five) minutes as needed. 25 tablet 2  . ondansetron (ZOFRAN) 4 MG tablet Take 1 tab every 6 hours as needed for nausea. 30 tablet 2  . sitaGLIPtin (JANUVIA) 100 MG tablet Take 1 tablet (100 mg total) by mouth daily. 90 tablet 3  . traZODone (DESYREL) 100 MG tablet TAKE 1 TABLET (100 MG TOTAL) BY MOUTH AT BEDTIME. 90 tablet 3  . magnesium 30 MG tablet Take by mouth. Reported on 09/20/2015     No current facility-administered medications on file prior to visit.    Allergies  Allergen Reactions  . No Known  Allergies     Past Medical History  Diagnosis Date  . Diabetes mellitus   . Hypertension   . Hypothyroidism   . Depression   . Vascular dementia   . Tobacco abuse     a. 45 pack year history - quit 09/16/11  . Chronic low back pain   . Coronary artery disease     a. s/p PCI/DES LCX x 3;  b. NSTEMI 09/16/11;  c.   09/17/11 [facility]) Severe RCA/LCX dzs - RCA treated, LCX intervention pending w/ Dr. Clayborn Bigness  . Sleep apnea     mild-does not use cpap  . Impingement syndrome of left shoulder   . Tendonitis of left rotator cuff   . Diverticulosis   . Myocardial infarction ([place])     x 5  . GERD (gastroesophageal reflux disease)   . Anginal pain [facility])     Past Surgical History  Procedure Laterality Date  . Back surgery    . Appendectomy    . Abdominal hysterectomy    . Colonoscopy N/A 11/02/2014    Procedure: COLONOSCOPY;  Surgeon: Inda Castle, MD;  Location: [facility];  Service: Endoscopy;  Laterality: N/A;  . Cataract extraction w/phaco Left 01/30/2015    Procedure: CATARACT EXTRACTION PHACO AND INTRAOCULAR LENS PLACEMENT (IOC);  Surgeon: Birder Robson, MD;  Location: [facility] ORS;  Service: Ophthalmology;  Laterality: Left;  [place] 00:47   . Cardiac catheterization  2013  . Coronary angioplasty  2012    stent x 3   . Coronary angioplasty with stent placement  2013  . Cardiac catheterization  2016  . Cataract extraction w/phaco Right 02/13/2015    Procedure: CATARACT EXTRACTION PHACO AND INTRAOCULAR LENS PLACEMENT (IOC);  Surgeon: Birder Robson, MD;  Location: [facility] OR[facility];  Service: Ophthalmology;  Laterality: Right;  cassette lot # [id number] H [place]  00:29.9 AP  20.7 CDE  6.20    Family History  Problem Relation Age of Onset  . Heart attack Mother     First MI @ 59 - Died @ 27  . Heart disease Mother   . Heart disease Father     Died @ 36  . Throat cancer Brother   . Liver cancer Brother   . Colon cancer Sister     Social History   Social History  . Marital Status:  Married    Spouse Name: N/A  . Number of Children: N/A  . Years of Education: N/A   Occupational History  . Not on file.   Social History Main Topics  . Smoking status: Current Every Day Smoker -- 0.25 packs/day for 45 years    Types: Cigarettes  . Smokeless tobacco: Never Used  Comment: Has cut back, trying to quit.   . Alcohol Use: No  . Drug Use: Yes    Special: Marijuana     Comment: last night 4.13.16  . Sexual Activity: Not on file   Other Topics Concern  . Not on file   Social History Narrative   Lives at home with her husband in [place].  Previously used marijuana - quit.      Regular exercise: no/ pain from a frozen rotator cuff   Caffeine use: coffee daily and pepsi      Does not have a living will.   Daughters and husband know her wishes- would desire CPR but not prolonged life support if futile   The PMH, PSH, Social History, Family History, Medications, and allergies have been reviewed in [facility], and have been updated if relevant. .lat Review of Systems  Constitutional: Negative for fatigue and unexpected weight change.  HENT: Negative.   Eyes: Negative.   Respiratory: Negative.   Gastrointestinal: Negative.   Endocrine: Negative.   Genitourinary: Negative.   Musculoskeletal: Negative for back pain.  Skin: Negative.   Allergic/Immunologic: Negative.   Neurological: Negative.   Hematological: Negative.   Psychiatric/Behavioral: Negative.  Negative for agitation.  All other systems reviewed and are negative.      Physical Exam  Constitutional: She is oriented to person, place, and time. She appears well-developed and well-nourished. No distress.  HENT:  Head: Normocephalic and atraumatic.  Eyes: Conjunctivae are normal.  Neck: Normal range of motion. Neck supple. No thyromegaly present.  Cardiovascular: Normal rate, regular rhythm, normal heart sounds and intact distal pulses.   Pulmonary/Chest: Effort normal and breath sounds normal. No  respiratory distress. She has no wheezes.  Abdominal: Soft.  Musculoskeletal: Normal range of motion. She exhibits no edema.  Neurological: She is alert and oriented to person, place, and time. No cranial nerve deficit.  Skin: Skin is warm and dry.  Psychiatric: She has a normal mood and affect. Her behavior is normal. Judgment and thought content normal.  Nursing note and vitals reviewed.  BP 160/92 mmHg  Pulse 74  Temp(Src) 98.4 F (36.9 C) (Oral)  Wt 143 lb 8 oz (65.091 kg)  SpO2 98% BP Readings from Last 3 Encounters:  09/20/15 160/92  08/28/15 150/90  08/10/15 110/64

## 2015-09-20 NOTE — Assessment & Plan Note (Signed)
Recheck labs today given change in rxs. The patient indicates understanding of these issues and agrees with the plan.

## 2015-09-20 NOTE — Progress Notes (Signed)
Pre visit review using our clinic review tool, if applicable. No additional management support is needed unless otherwise documented below in the visit note. 

## 2015-09-20 NOTE — Patient Instructions (Signed)
Great to see you. I will call you with your lab results from today.  Please take your blood pressure as soon as you get home.

## 2015-09-20 NOTE — Assessment & Plan Note (Signed)
Check a1c today.  If reasonable control, I can manage her diabetes meds.  If not, will refer to another endocrinologist. The patient indicates understanding of these issues and agrees with the plan.

## 2015-09-20 NOTE — Assessment & Plan Note (Signed)
Was followed by endo. Recheck labs today. The patient indicates understanding of these issues and agrees with the plan.

## 2015-09-20 NOTE — Assessment & Plan Note (Signed)
Elevated today- asymptomatic.  I advised her to take her rx as soon as she gets home. The patient indicates understanding of these issues and agrees with the plan.

## 2015-10-03 DIAGNOSIS — E119 Type 2 diabetes mellitus without complications: Secondary | ICD-10-CM | POA: Diagnosis not present

## 2015-10-08 ENCOUNTER — Telehealth: Payer: Self-pay | Admitting: Gastroenterology

## 2015-10-08 ENCOUNTER — Encounter: Payer: Self-pay | Admitting: Family Medicine

## 2015-10-08 ENCOUNTER — Ambulatory Visit (INDEPENDENT_AMBULATORY_CARE_PROVIDER_SITE_OTHER): Payer: Medicare Other | Admitting: Family Medicine

## 2015-10-08 VITALS — BP 152/86 | HR 82 | Temp 98.2°F | Wt 146.5 lb

## 2015-10-08 DIAGNOSIS — I10 Essential (primary) hypertension: Secondary | ICD-10-CM | POA: Diagnosis not present

## 2015-10-08 DIAGNOSIS — I208 Other forms of angina pectoris: Secondary | ICD-10-CM

## 2015-10-08 DIAGNOSIS — Z9119 Patient's noncompliance with other medical treatment and regimen: Secondary | ICD-10-CM | POA: Diagnosis not present

## 2015-10-08 DIAGNOSIS — Z91199 Patient's noncompliance with other medical treatment and regimen due to unspecified reason: Secondary | ICD-10-CM

## 2015-10-08 DIAGNOSIS — E038 Other specified hypothyroidism: Secondary | ICD-10-CM

## 2015-10-08 DIAGNOSIS — E785 Hyperlipidemia, unspecified: Secondary | ICD-10-CM

## 2015-10-08 MED ORDER — LEVOTHYROXINE SODIUM 175 MCG PO TABS: ORAL_TABLET | ORAL | Status: DC

## 2015-10-08 NOTE — Progress Notes (Signed)
70 yo pleasant female with h/o CAD, COPD, poorly controlled DM, HLD and hypothyroidism here for 2 week follow up.  Uncontrolled type 2 diabetes - when I saw her two weeks ago, she admitted that she was not following up with endo. I checked and a1c which was very elevated, appt made for her to see Dr. Cruzita Lederer- first available- next week, and advised her to come see me here today.  Currently taking lantus dose to 30 units in PM and advised to continue to increase dose 3 units every 3 days until fasting FSBS less than 130.  Advised to check FSBS twice daily- FSBS having been around 130 fasting. Also takes Januvia 100 mg daily.   Lab Results  Component Value Date   HGBA1C 10.8* 09/20/2015     Lab Results  Component Value Date   CHOL 339* 09/20/2015   HDL 57.10 09/20/2015   LDLCALC 146* 03/12/2015   LDLDIRECT 237.0 09/20/2015   TRIG 224.0* 09/20/2015   CHOLHDL 6 09/20/2015   Lab Results  Component Value Date   ALT 9 09/20/2015   AST 11 09/20/2015   ALKPHOS 90 09/20/2015   BILITOT 0.3 09/20/2015   Hypothyroidism- currently taking synthroid 175 mcg daily.  Thyroid studies also very abnormal but she admitted to not being compliant with her medications in the weeks prior. She still has not restarted this rx.   Lab Results  Component Value Date   TSH 51.35* 09/20/2015   BP was also very elevated- had not been taking her blood pressure medication for over a week but was asymptomatic.  Current Outpatient Prescriptions on File Prior to Visit  Medication Sig Dispense Refill  . aspirin 81 MG tablet Take 81 mg by mouth daily.      Marland Kitchen atorvastatin (LIPITOR) 80 MG tablet Take 1 tablet (80 mg total) by mouth daily. 90 tablet 3  . Calcium & Magnesium Carbonates (MYLANTA PO) Take by mouth.      . carvedilol (COREG) 3.125 MG tablet Take 2 tablets (6.25 mg total) by mouth 2 (two) times daily. 180 tablet 3  . clopidogrel (PLAVIX) 75 MG tablet TAKE 1 TABLET (75 MG TOTAL) BY MOUTH DAILY. 90  tablet 3  . DULoxetine (CYMBALTA) 60 MG capsule TAKE 1 CAPSULE BY MOUTH DAILY. 90 capsule 3  . enalapril (VASOTEC) 5 MG tablet Take 1 tablet (5 mg total) by mouth daily. 90 tablet 3  . ezetimibe (ZETIA) 10 MG tablet Take 1 tablet (10 mg total) by mouth daily. 90 tablet 3  . glipiZIDE (GLUCOTROL) 10 MG tablet Before breakfast and supper (evening meal) 180 tablet 1  . glucose blood (FREESTYLE LITE) test strip Check blood sugar 3 times daily to check blood sugars - Uncontrolled diabetes mellitus 300 each 3  . glucose monitoring kit (FREESTYLE) monitoring kit 1 each by Does not apply route as needed for other. PHARMACY PLEASE SUBSTITUTE WHATEVER GLUCOMETER YOU CARRY 1 each 1  . insulin aspart (NOVOLOG FLEXPEN) 100 UNIT/ML FlexPen Inject 12 Units into the skin 3 (three) times daily with meals.    . Insulin Glargine (LANTUS SOLOSTAR) 100 UNIT/ML Solostar Pen Inject 40 Units into the skin as directed. Daily between 8PM and 10PM    . isosorbide mononitrate (IMDUR) 30 MG 24 hr tablet Take 1 tablet (30 mg total) by mouth daily. 90 tablet 3  . lansoprazole (PREVACID) 15 MG capsule Take by mouth.    . magnesium 30 MG tablet Take by mouth. Reported on 09/20/2015    . metoCLOPramide (  REGLAN) 10 MG tablet Take 1 tablet (10 mg total) by mouth 4 (four) times daily. 90 tablet 0  . nitroGLYCERIN (NITROSTAT) 0.4 MG SL tablet Place 1 tablet (0.4 mg total) under the tongue every 5 (five) minutes as needed. 25 tablet 2  . ondansetron (ZOFRAN) 4 MG tablet Take 1 tab every 6 hours as needed for nausea. 30 tablet 2  . sitaGLIPtin (JANUVIA) 100 MG tablet Take 1 tablet (100 mg total) by mouth daily. 90 tablet 3  . traZODone (DESYREL) 100 MG tablet TAKE 1 TABLET (100 MG TOTAL) BY MOUTH AT BEDTIME. 90 tablet 3   No current facility-administered medications on file prior to visit.    Allergies  Allergen Reactions  . No Known Allergies     Past Medical History  Diagnosis Date  . Diabetes mellitus   . Hypertension   .  Hypothyroidism   . Depression   . Vascular dementia   . Tobacco abuse     a. 45 pack year history - quit 09/16/11  . Chronic low back pain   . Coronary artery disease     a. s/p PCI/DES LCX x 3;  b. NSTEMI 09/16/11;  c.   09/17/11 [facility]) Severe RCA/LCX dzs - RCA treated, LCX intervention pending w/ Dr. Clayborn Bigness  . Sleep apnea     mild-does not use cpap  . Impingement syndrome of left shoulder   . Tendonitis of left rotator cuff   . Diverticulosis   . Myocardial infarction ([place])     x 5  . GERD (gastroesophageal reflux disease)   . Anginal pain [facility])     Past Surgical History  Procedure Laterality Date  . Back surgery    . Appendectomy    . Abdominal hysterectomy    . Colonoscopy N/A 11/02/2014    Procedure: COLONOSCOPY;  Surgeon: Inda Castle, MD;  Location: [place];  Service: Endoscopy;  Laterality: N/A;  . Cataract extraction w/phaco Left 01/30/2015    Procedure: CATARACT EXTRACTION PHACO AND INTRAOCULAR LENS PLACEMENT (IOC);  Surgeon: Birder Robson, MD;  Location: [facility];  Service: Ophthalmology;  Laterality: Left;  [place] 00:47   . Cardiac catheterization  2013  . Coronary angioplasty  2012    stent x 3   . Coronary angioplasty with stent placement  2013  . Cardiac catheterization  2016  . Cataract extraction w/phaco Right 02/13/2015    Procedure: CATARACT EXTRACTION PHACO AND INTRAOCULAR LENS PLACEMENT (IOC);  Surgeon: Birder Robson, MD;  Location: [facility];  Service: Ophthalmology;  Laterality: Right;  cassette lot # [id number] H [place]  00:29.9 AP  20.7 CDE  6.20    Family History  Problem Relation Age of Onset  . Heart attack Mother     First MI @ 39 - Died @ 52  . Heart disease Mother   . Heart disease Father     Died @ 32  . Throat cancer Brother   . Liver cancer Brother   . Colon cancer Sister     Social History   Social History  . Marital Status: Married    Spouse Name: N/A  . Number of Children: N/A  . Years of Education: N/A   Occupational  History  . Not on file.   Social History Main Topics  . Smoking status: Current Every Day Smoker -- 0.25 packs/day for 45 years    Types: Cigarettes  . Smokeless tobacco: Never Used     Comment: Has cut back, trying to quit.   Marland Kitchen  Alcohol Use: No  . Drug Use: Yes    Special: Marijuana     Comment: last night 4.13.16  . Sexual Activity: Not on file   Other Topics Concern  . Not on file   Social History Narrative   Lives at home with her husband in [place].  Previously used marijuana - quit.      Regular exercise: no/ pain from a frozen rotator cuff   Caffeine use: coffee daily and pepsi      Does not have a living will.   Daughters and husband know her wishes- would desire CPR but not prolonged life support if futile   The PMH, PSH, Social History, Family History, Medications, and allergies have been reviewed in [facility], and have been updated if relevant. .lat Review of Systems  Constitutional: Negative for fatigue and unexpected weight change.  HENT: Negative.   Eyes: Negative.   Respiratory: Negative.   Gastrointestinal: Negative.   Endocrine: Negative.   Genitourinary: Negative.   Musculoskeletal: Negative for back pain.  Skin: Negative.   Allergic/Immunologic: Negative.   Neurological: Negative.   Hematological: Negative.   Psychiatric/Behavioral: Negative.  Negative for agitation.  All other systems reviewed and are negative.      Physical Exam  Constitutional: She is oriented to person, place, and time. She appears well-developed and well-nourished. No distress.  HENT:  Head: Normocephalic and atraumatic.  Eyes: Conjunctivae are normal.  Neck: Normal range of motion. Neck supple. No thyromegaly present.  Cardiovascular: Normal rate, regular rhythm, normal heart sounds and intact distal pulses.   Pulmonary/Chest: Effort normal and breath sounds normal. No respiratory distress. She has no wheezes.  Abdominal: Soft.  Musculoskeletal: Normal range of motion. She  exhibits no edema.  Neurological: She is alert and oriented to person, place, and time. No cranial nerve deficit.  Skin: Skin is warm and dry.  Psychiatric: She has a normal mood and affect. Her behavior is normal. Judgment and thought content normal.  Nursing note and vitals reviewed.  BP 152/86 mmHg  Pulse 82  Temp(Src) 98.2 F (36.8 C) (Oral)  Wt 146 lb 8 oz (66.452 kg)  SpO2 94% BP Readings from Last 3 Encounters:  10/08/15 152/86  09/20/15 160/92  08/28/15 150/90

## 2015-10-08 NOTE — Assessment & Plan Note (Signed)
Improved but still not quite at goal. She did not take rx yet today but states that she has restarted this as well.

## 2015-10-08 NOTE — Assessment & Plan Note (Signed)
Pt states that she has not received rx yet from mail order. 30 day supply sent to [facility] and she will pick up and restart rx today.

## 2015-10-08 NOTE — Progress Notes (Signed)
Pre visit review using our clinic review tool, if applicable. No additional management support is needed unless otherwise documented below in the visit note. 

## 2015-10-08 NOTE — Telephone Encounter (Signed)
Spoke with patient and she states her PCP wants her to see GI for constipation and abdominal pain. Scheduled with Alonza Bogus, PA on 10/12/15 at 10:00 AM.

## 2015-10-08 NOTE — Assessment & Plan Note (Signed)
Per pt, she states that now that she is feeling better, she has restarted all of her rxs. She has appt scheduled with Dr. Cruzita Lederer on Monday.

## 2015-10-12 ENCOUNTER — Encounter: Payer: Self-pay | Admitting: Gastroenterology

## 2015-10-12 ENCOUNTER — Ambulatory Visit (INDEPENDENT_AMBULATORY_CARE_PROVIDER_SITE_OTHER): Payer: Medicare Other | Admitting: Gastroenterology

## 2015-10-12 ENCOUNTER — Telehealth: Payer: Self-pay

## 2015-10-12 VITALS — BP 158/98 | HR 72 | Ht 64.75 in | Wt 147.1 lb

## 2015-10-12 DIAGNOSIS — R1013 Epigastric pain: Secondary | ICD-10-CM | POA: Insufficient documentation

## 2015-10-12 DIAGNOSIS — R112 Nausea with vomiting, unspecified: Secondary | ICD-10-CM | POA: Diagnosis not present

## 2015-10-12 DIAGNOSIS — I208 Other forms of angina pectoris: Secondary | ICD-10-CM

## 2015-10-12 DIAGNOSIS — R1110 Vomiting, unspecified: Secondary | ICD-10-CM | POA: Insufficient documentation

## 2015-10-12 MED ORDER — PANTOPRAZOLE SODIUM 40 MG PO TBEC: 40.0000 mg | DELAYED_RELEASE_TABLET | Freq: Every day | ORAL | Status: DC

## 2015-10-12 NOTE — Patient Instructions (Addendum)
  You have been scheduled for an endoscopy. Please follow written instructions given to you at your visit today. If you use inhalers (even only as needed), please bring them with you on the day of your procedure. Your physician has requested that you go to www.startemmi.com and enter the access code given to you at your visit today. This web site gives a general overview about your procedure. However, you should still follow specific instructions given to you by our office regarding your preparation for the procedure.  You will be contacted by our office prior to your procedure for directions on holding your Plavix.  If you do not hear from our office 1 week prior to your scheduled procedure, please call [phone] to discuss.   I appreciate the opportunity to care for you.

## 2015-10-12 NOTE — Progress Notes (Signed)
     10/12/2015 NINTI ANTILL [id number] [birth year]   History of Present Illness:  This is a 70 year old female returning for ongoing complaints of nausea and vomiting.  Was seen here in February for complaints of nausea, vomiting, and diarrhea.  The diarrhea has resolved, but continues to have nausea and vomiting.  Also with upper abdominal pain.  Does not matter whether or not she has eaten.  Usually just vomits yellow acid/bile.  Has been diabetic for 10 years or so.  Is on insulin and PO medication.  Despite her ongoing complaints, she has managed to gain 10 pounds since her last visit in February.    CT scan 08/17/15 showed "Long segment of mild symmetric descending and sigmoid wall thickening of the background of diverticulosis, likely representing colitis, which may be infectious, inflammatory, or ischemic.  No evidence of focal diverticulitis, perforation, or abscess formation."  Saw her PCP and was started on Cipro and Flagyl on 1/30 empirically for 10 days course to treat infectious source.  Cdiff was negative.  Once again, diarrhea has resolved.  Last colonoscopy 10/2014 with some polyps (tubular adenomas), diverticulosis, and internal hemorrhoids.  Random biopsies normal.  Has CAD and is on Plavix by her cardiologist, Dr. Rockey Situ.   Current Medications, Allergies, Past Medical History, Past Surgical History, Family History and Social History were reviewed in [employer] record.   Physical Exam: BP 158/98 mmHg  Pulse 72  Ht 5' 4.75" (1.645 m)  Wt 147 lb 2 oz (66.735 kg)  BMI 24.66 kg/m2 General: Well developed white female in no acute distress Head: Normocephalic and atraumatic Eyes:  Sclerae anicteric, conjunctiva pink  Ears: Normal auditory acuity Lungs: Clear throughout to auscultation Heart: Regular rate and rhythm Abdomen: Soft, non-distended.  Normal bowel sounds.  Mild diffuse TTP. Musculoskeletal: Symmetrical with no gross deformities    Extremities: No edema  Neurological: Alert oriented x 4, grossly non-focal Psychological:  Alert and cooperative. Normal mood and affect  Assessment and Recommendations: -Nausea, vomiting:  Ongoing.  Will schedule EGD with Dr. Silverio Decamp to rule out ulcer disease, etc.  Will start her on pantoprazole 40 mg daily.  ? If she diabetic gastroparesis with long-standing DM and will need GES.  ? HIDA scan. -CAD on Plavix:  Hold Plavix for 5 days before procedure - will instruct when and how to resume after procedure. Risks and benefits of procedure including bleeding, perforation, infection, missed lesions, medication reactions and possible hospitalization or surgery if complications occur explained. Additional rare but real risk of cardiovascular event such as heart attack or ischemia/infarct of other organs off Plavix explained and need to seek urgent help if this occurs. Will communicate by phone or EMR with patient's prescribing provider, Dr. Rockey Situ, that to confirm holding Plavix is reasonable in this case.

## 2015-10-12 NOTE — Telephone Encounter (Signed)
[facility] [address] [place] [address]  10/12/2015   RE: Sarah Payne DOB: August 23, 1945 MRN: [id number]   Dear Ida Rogue MD,    We have scheduled the above patient for an endoscopic procedure. Our records show that she is on anticoagulation therapy.   Please advise as to how long the patient may come off her therapy of Plavix prior to the EGD procedure, which is scheduled for 11/06/15.  Please fax back/ or route the completed form to [place], [place] at [phone].   Sincerely,    Dr. Silverio Decamp

## 2015-10-14 NOTE — Telephone Encounter (Signed)
Ok to come off plavix for 5 days prior to procedure, Stay on aspirin Restart plavix once procedure complete

## 2015-10-15 ENCOUNTER — Ambulatory Visit: Payer: Medicare Other | Admitting: Internal Medicine

## 2015-10-15 NOTE — Progress Notes (Signed)
Reviewed and agree with documentation and assessment and plan. K. Veena Inessa Wardrop , MD   

## 2015-10-15 NOTE — Telephone Encounter (Signed)
Patient informed to hold the Plavix 5 days prior to procedure and to continue the ASA.  She verbalized understanding.

## 2015-10-19 ENCOUNTER — Encounter: Payer: Self-pay | Admitting: Internal Medicine

## 2015-10-23 ENCOUNTER — Telehealth: Payer: Self-pay | Admitting: Gastroenterology

## 2015-10-23 NOTE — Telephone Encounter (Signed)
Ok to refill 

## 2015-10-23 NOTE — Telephone Encounter (Signed)
Jess, Can patient have this refill of Zofran. Looks like you seen her in March

## 2015-10-24 MED ORDER — ONDANSETRON HCL 4 MG PO TABS: ORAL_TABLET | ORAL | Status: DC

## 2015-10-24 NOTE — Telephone Encounter (Signed)
REFILLED PATIENTS ZOFRAN

## 2015-11-06 ENCOUNTER — Encounter: Payer: Self-pay | Admitting: Gastroenterology

## 2015-11-06 ENCOUNTER — Ambulatory Visit (AMBULATORY_SURGERY_CENTER): Payer: Medicare Other | Admitting: Gastroenterology

## 2015-11-06 VITALS — BP 133/65 | HR 63 | Temp 97.7°F | Resp 17 | Ht 64.75 in | Wt 147.0 lb

## 2015-11-06 DIAGNOSIS — R112 Nausea with vomiting, unspecified: Secondary | ICD-10-CM | POA: Diagnosis not present

## 2015-11-06 DIAGNOSIS — E119 Type 2 diabetes mellitus without complications: Secondary | ICD-10-CM | POA: Diagnosis not present

## 2015-11-06 DIAGNOSIS — R1013 Epigastric pain: Secondary | ICD-10-CM | POA: Diagnosis not present

## 2015-11-06 DIAGNOSIS — I252 Old myocardial infarction: Secondary | ICD-10-CM | POA: Diagnosis not present

## 2015-11-06 LAB — GLUCOSE, CAPILLARY
Glucose-Capillary: 243 mg/dL — ABNORMAL HIGH (ref 65–99)
Glucose-Capillary: 192 mg/dL — ABNORMAL HIGH (ref 65–99)

## 2015-11-06 MED ORDER — SODIUM CHLORIDE 0.9 % IV SOLN: 500.0000 mL | INTRAVENOUS | Status: DC

## 2015-11-06 NOTE — Op Note (Addendum)
[facility] Patient Name: Sarah Payne Procedure Date: 11/06/2015 2:18 PM MRN: [id number] Endoscopist: Mauri Pole , MD Age: 70 Date of Birth: 1946-06-10 Gender: Female Procedure:                Upper GI endoscopy Indications:              Persistent vomiting of unknown cause, Functional                            Dyspepsia Medicines:                Monitored Anesthesia Care Procedure:                Pre-Anesthesia Assessment:                           - Prior to the procedure, a History and Physical                            was performed, and patient medications and                            allergies were reviewed. The patient's tolerance of                            previous anesthesia was also reviewed. The risks                            and benefits of the procedure and the sedation                            options and risks were discussed with the patient.                            All questions were answered, and informed consent                            was obtained. Prior Anticoagulants: The patient has                            taken no previous anticoagulant or antiplatelet                            agents. ASA Grade Assessment: II - A patient with                            mild systemic disease. After reviewing the risks                            and benefits, the patient was deemed in                            satisfactory condition to undergo the procedure.  After obtaining informed consent, the endoscope was                            passed under direct vision. Throughout the                            procedure, the patient's blood pressure, pulse, and                            oxygen saturations were monitored continuously. The                            Model GIF-[id number]) scope was introduced                            through the mouth, and advanced to the second part                            of  duodenum. The upper GI endoscopy was                            accomplished without difficulty. The patient                            tolerated the procedure well. Scope In: Scope Out: Findings:                 Biopsies were taken with a cold forceps on the                            anterior wall of the gastric antrum, on the greater                            curvature of the gastric antrum, on the lesser                            curvature of the gastric antrum and on the                            posterior wall of the gastric antrum for                            Helicobacter pylori testing.                           The exam was otherwise without abnormality. Complications:            No immediate complications. Estimated Blood Loss:     Estimated blood loss was minimal. Impression:               - The examination was otherwise normal.                           - Biopsies were taken with a cold forceps for  Helicobacter pylori testing. Recommendation:           - Patient has a contact number available for                            emergencies. The signs and symptoms of potential                            delayed complications were discussed with the                            patient. Return to normal activities tomorrow.                            Written discharge instructions were provided to the                            patient.                           - Resume previous diet.                           - Continue present medications.                           - Await pathology results.                           - No repeat upper endoscopy.                           - Return to GI office in 2 months. Mauri Pole, MD 11/06/2015 2:41:15 PM This report has been signed electronically. Addendum Number: 1   Addendum Date: 11/06/2015 2:47:44 PM      Ok to restart Plavix today Mauri Pole, MD 11/06/2015 2:47:57 PM This report has been  signed electronically.

## 2015-11-06 NOTE — Progress Notes (Signed)
Called to room to assist during endoscopic procedure.  Patient ID and intended procedure confirmed with present staff. Received instructions for my participation in the procedure from the performing physician.  

## 2015-11-06 NOTE — Patient Instructions (Addendum)
YOU HAD AN ENDOSCOPIC PROCEDURE TODAY AT [facility]:   Refer to the procedure report that was given to you for any specific questions about what was found during the examination.  If the procedure report does not answer your questions, please call your gastroenterologist to clarify.  If you requested that your care partner not be given the details of your procedure findings, then the procedure report has been included in a sealed envelope for you to review at your convenience later.  YOU SHOULD EXPECT: Some feelings of bloating in the abdomen. Passage of more gas than usual.  Walking can help get rid of the air that was put into your GI tract during the procedure and reduce the bloating. If you had a lower endoscopy (such as a colonoscopy or flexible sigmoidoscopy) you may notice spotting of blood in your stool or on the toilet paper. If you underwent a bowel prep for your procedure, you may not have a normal bowel movement for a few days.  Please Note:  You might notice some irritation and congestion in your nose or some drainage.  This is from the oxygen used during your procedure.  There is no need for concern and it should clear up in a day or so.  SYMPTOMS TO REPORT IMMEDIATELY:   Following upper endoscopy (EGD)  Vomiting of blood or coffee ground material  New chest pain or pain under the shoulder blades  Painful or persistently difficult swallowing  New shortness of breath  Fever of 100F or higher  Black, tarry-looking stools  For urgent or emergent issues, a gastroenterologist can be reached at any hour by calling [phone].   DIET: Your first meal following the procedure should be a small meal and then it is ok to progress to your normal diet. Heavy or fried foods are harder to digest and may make you feel nauseous or bloated.  Likewise, meals heavy in dairy and vegetables can increase bloating.  Drink plenty of fluids but you should avoid alcoholic beverages for  24 hours.  ACTIVITY:  You should plan to take it easy for the rest of today and you should NOT DRIVE or use heavy machinery until tomorrow (because of the sedation medicines used during the test).    FOLLOW UP: Our staff will call the number listed on your records the next business day following your procedure to check on you and address any questions or concerns that you may have regarding the information given to you following your procedure. If we do not reach you, we will leave a message.  However, if you are feeling well and you are not experiencing any problems, there is no need to return our call.  We will assume that you have returned to your regular daily activities without incident.  If any biopsies were taken you will be contacted by phone or by letter within the next 1-3 weeks.  Please call us at [phone] if you have not heard about the biopsies in 3 weeks.    SIGNATURES/CONFIDENTIALITY: You and/or your care partner have signed paperwork which will be entered into your electronic medical record.  These signatures attest to the fact that that the information above on your After Visit Summary has been reviewed and is understood.  Full responsibility of the confidentiality of this discharge information lies with you and/or your care-partner.  Await pathology results. Resume Plavix tomorrow.

## 2015-11-06 NOTE — Progress Notes (Signed)
Patient awakening,vss,report to rn 

## 2015-11-07 ENCOUNTER — Telehealth: Payer: Self-pay | Admitting: *Deleted

## 2015-11-07 NOTE — Telephone Encounter (Signed)
  Follow up Call-  Call back number 11/06/2015  Post procedure Call Back phone  # [phone]  Permission to leave phone message Yes     Patient questions:  Do you have a fever, pain , or abdominal swelling? No. Pain Score  0 *  Have you tolerated food without any problems? Yes.    Have you been able to return to your normal activities? Yes.    Do you have any questions about your discharge instructions: Diet   No. Medications  No. Follow up visit  No.  Do you have questions or concerns about your Care? No.  Actions: * If pain score is 4 or above: No action needed, pain <4.

## 2015-11-08 ENCOUNTER — Other Ambulatory Visit: Payer: Self-pay

## 2015-11-14 ENCOUNTER — Encounter: Payer: Self-pay | Admitting: Gastroenterology

## 2015-11-28 ENCOUNTER — Other Ambulatory Visit: Payer: Self-pay

## 2015-11-28 MED ORDER — LEVOTHYROXINE SODIUM 175 MCG PO TABS: ORAL_TABLET | ORAL | Status: DC

## 2015-11-28 NOTE — Telephone Encounter (Signed)
Pt request refill levothyroxine 175 mcg to [facility] [address]. Pt seen 10/08/15 and TSH was abnormal; will refill # 30 and discuss further refills at 12/03/15 appt. Pt voiced understanding.

## 2015-12-03 ENCOUNTER — Ambulatory Visit (INDEPENDENT_AMBULATORY_CARE_PROVIDER_SITE_OTHER): Payer: Medicare Other | Admitting: Family Medicine

## 2015-12-03 ENCOUNTER — Encounter: Payer: Self-pay | Admitting: Family Medicine

## 2015-12-03 VITALS — BP 136/84 | HR 66 | Temp 98.2°F | Wt 141.2 lb

## 2015-12-03 DIAGNOSIS — I208 Other forms of angina pectoris: Secondary | ICD-10-CM | POA: Diagnosis not present

## 2015-12-03 DIAGNOSIS — N760 Acute vaginitis: Secondary | ICD-10-CM | POA: Diagnosis not present

## 2015-12-03 DIAGNOSIS — E038 Other specified hypothyroidism: Secondary | ICD-10-CM

## 2015-12-03 DIAGNOSIS — E1151 Type 2 diabetes mellitus with diabetic peripheral angiopathy without gangrene: Secondary | ICD-10-CM | POA: Diagnosis not present

## 2015-12-03 MED ORDER — FLUCONAZOLE 150 MG PO TABS: ORAL_TABLET | ORAL | Status: DC

## 2015-12-03 MED ORDER — DULOXETINE HCL 60 MG PO CPEP: ORAL_CAPSULE | ORAL | Status: DC

## 2015-12-03 MED ORDER — TRAZODONE HCL 100 MG PO TABS: ORAL_TABLET | ORAL | Status: DC

## 2015-12-03 MED ORDER — ENALAPRIL MALEATE 5 MG PO TABS: 5.0000 mg | ORAL_TABLET | Freq: Every day | ORAL | Status: DC

## 2015-12-03 NOTE — Progress Notes (Signed)
Pre visit review using our clinic review tool, if applicable. No additional management support is needed unless otherwise documented below in the visit note. 

## 2015-12-03 NOTE — Assessment & Plan Note (Signed)
Continue current rx.  Follow up with Dr. Cruzita Lederer.

## 2015-12-03 NOTE — Assessment & Plan Note (Signed)
Remains poorly controlled. Increase dose of Lantus to 43 units nightly. Keep follow up with Dr. Cruzita Lederer next week.

## 2015-12-03 NOTE — Assessment & Plan Note (Signed)
New- likely exacerbated by poorly controlled DM. Diflucan 150 mg po every 3 days x 3 doses.

## 2015-12-03 NOTE — Patient Instructions (Signed)
Please keep your appointment with Dr. Cruzita Lederer. Increase your Lantus to 43 units nightly.

## 2015-12-03 NOTE — Progress Notes (Signed)
70 yo pleasant female with h/o CAD, COPD, poorly controlled DM, HLD and hypothyroidism here for 2 week follow up.  Uncontrolled type 2 diabetes - when I saw her  In March she admitted that she was not following up with endo. I checked and a1c which was very elevated, appt made for her to see Dr. Cruzita Lederer- first available-3/27 which she did not keep. Now has appt with Dr. Cruzita Lederer on 12/11/15.  Currently taking lantus dose to 40 units in PM and 14 units of novalog.  Advised to check FSBS twice daily- FSBS having been around 150s- 200s Also takes Januvia 100 mg daily.  Lab Results  Component Value Date   HGBA1C 10.8* 09/20/2015     Lab Results  Component Value Date   CHOL 339* 09/20/2015   HDL 57.10 09/20/2015   LDLCALC 146* 03/12/2015   LDLDIRECT 237.0 09/20/2015   TRIG 224.0* 09/20/2015   CHOLHDL 6 09/20/2015   Lab Results  Component Value Date   ALT 9 09/20/2015   AST 11 09/20/2015   ALKPHOS 90 09/20/2015   BILITOT 0.3 09/20/2015   Hypothyroidism- currently taking synthroid 175 mcg daily.  Thyroid studies also very abnormal but she admitted to not being compliant with her medications in the weeks prior. She still has not restarted this rx.   Lab Results  Component Value Date   TSH 51.35* 09/20/2015   ?yeast infection- over a week and a half or redness and irritation of the vulva extending to rectum.  OTC topical monistat has not been effective.   Current Outpatient Prescriptions on File Prior to Visit  Medication Sig Dispense Refill  . aspirin 81 MG tablet Take 81 mg by mouth daily.      Marland Kitchen atorvastatin (LIPITOR) 80 MG tablet Take 1 tablet (80 mg total) by mouth daily. 90 tablet 3  . Calcium & Magnesium Carbonates (MYLANTA PO) Take by mouth.      . carvedilol (COREG) 3.125 MG tablet Take 2 tablets (6.25 mg total) by mouth 2 (two) times daily. 180 tablet 3  . clopidogrel (PLAVIX) 75 MG tablet TAKE 1 TABLET (75 MG TOTAL) BY MOUTH DAILY. 90 tablet 3  . DULoxetine  (CYMBALTA) 60 MG capsule TAKE 1 CAPSULE BY MOUTH DAILY. 90 capsule 3  . enalapril (VASOTEC) 5 MG tablet Take 1 tablet (5 mg total) by mouth daily. 90 tablet 3  . ezetimibe (ZETIA) 10 MG tablet Take 1 tablet (10 mg total) by mouth daily. 90 tablet 3  . glipiZIDE (GLUCOTROL) 10 MG tablet Before breakfast and supper (evening meal) 180 tablet 1  . glucose blood (FREESTYLE LITE) test strip Check blood sugar 3 times daily to check blood sugars - Uncontrolled diabetes mellitus 300 each 3  . glucose monitoring kit (FREESTYLE) monitoring kit 1 each by Does not apply route as needed for other. PHARMACY PLEASE SUBSTITUTE WHATEVER GLUCOMETER YOU CARRY 1 each 1  . insulin aspart (NOVOLOG FLEXPEN) 100 UNIT/ML FlexPen Inject 12 Units into the skin 3 (three) times daily with meals.    . Insulin Glargine (LANTUS SOLOSTAR) 100 UNIT/ML Solostar Pen Inject 40 Units into the skin as directed. Daily between 8PM and 10PM    . isosorbide mononitrate (IMDUR) 30 MG 24 hr tablet Take 1 tablet (30 mg total) by mouth daily. 90 tablet 3  . lansoprazole (PREVACID) 15 MG capsule Take by mouth. Reported on 11/06/2015    . levothyroxine (SYNTHROID, LEVOTHROID) 175 MCG tablet TAKE ONE TABLET BY MOUTH ONCE DAILY BEFORE BREAKFAST 30 tablet  0  . magnesium 30 MG tablet Take by mouth. Reported on 09/20/2015    . metoCLOPramide (REGLAN) 10 MG tablet Take 1 tablet (10 mg total) by mouth 4 (four) times daily. 90 tablet 0  . nitroGLYCERIN (NITROSTAT) 0.4 MG SL tablet Place 1 tablet (0.4 mg total) under the tongue every 5 (five) minutes as needed. 25 tablet 2  . ondansetron (ZOFRAN) 4 MG tablet Take 1 tab every 6 hours as needed for nausea. 30 tablet 2  . pantoprazole (PROTONIX) 40 MG tablet Take 1 tablet (40 mg total) by mouth daily. 90 tablet 3  . sitaGLIPtin (JANUVIA) 100 MG tablet Take 1 tablet (100 mg total) by mouth daily. 90 tablet 3  . traZODone (DESYREL) 100 MG tablet TAKE 1 TABLET (100 MG TOTAL) BY MOUTH AT BEDTIME. 90 tablet 3   No  current facility-administered medications on file prior to visit.    Allergies  Allergen Reactions  . No Known Allergies     Past Medical History  Diagnosis Date  . Diabetes mellitus   . Hypertension   . Hypothyroidism   . Depression   . Vascular dementia   . Tobacco abuse     a. 45 pack year history - quit 09/16/11  . Chronic low back pain   . Coronary artery disease     a. s/p PCI/DES LCX x 3;  b. NSTEMI 09/16/11;  c.   09/17/11 Cath [facility]) Severe RCA/LCX dzs - RCA treated, LCX intervention pending w/ Dr. Clayborn Bigness  . Sleep apnea     mild-does not use cpap  . Impingement syndrome of left shoulder   . Tendonitis of left rotator cuff   . Diverticulosis   . Myocardial infarction ([employer])     x 5  . GERD (gastroesophageal reflux disease)   . Anginal pain ([place])   . Cataract     BILATERAL REMOVED  . Hyperlipidemia     Past Surgical History  Procedure Laterality Date  . Back surgery    . Appendectomy    . Abdominal hysterectomy    . Colonoscopy N/A 11/02/2014    Procedure: COLONOSCOPY;  Surgeon: Inda Castle, MD;  Location: [facility];  Service: Endoscopy;  Laterality: N/A;  . Cataract extraction w/phaco Left 01/30/2015    Procedure: CATARACT EXTRACTION PHACO AND INTRAOCULAR LENS PLACEMENT (IOC);  Surgeon: Birder Robson, MD;  Location: [facility];  Service: Ophthalmology;  Laterality: Left;  [place] 00:47   . Cardiac catheterization  2013  . Coronary angioplasty  2012    stent x 3   . Coronary angioplasty with stent placement  2013  . Cardiac catheterization  2016  . Cataract extraction w/phaco Right 02/13/2015    Procedure: CATARACT EXTRACTION PHACO AND INTRAOCULAR LENS PLACEMENT (IOC);  Surgeon: Birder Robson, MD;  Location: [facility];  Service: Ophthalmology;  Laterality: Right;  cassette lot # [id number] H [place]  00:29.9 AP  20.7 CDE  6.20    Family History  Problem Relation Age of Onset  . Heart attack Mother     First MI @ 50 - Died @ 32  . Heart disease Mother   . Heart  disease Father     Died @ 20  . Throat cancer Brother   . Liver cancer Brother   . Colon cancer Sister     Social History   Social History  . Marital Status: Married    Spouse Name: N/A  . Number of Children: N/A  . Years of Education: N/A   Occupational  History  . Not on file.   Social History Main Topics  . Smoking status: Current Every Day Smoker -- 0.25 packs/day for 45 years    Types: Cigarettes  . Smokeless tobacco: Never Used     Comment: Has cut back, trying to quit.   . Alcohol Use: No  . Drug Use: Yes    Special: Marijuana     Comment: last night 4.13.16  . Sexual Activity: Not on file   Other Topics Concern  . Not on file   Social History Narrative   Lives at home with her husband in [place].  Previously used marijuana - quit.      Regular exercise: no/ pain from a frozen rotator cuff   Caffeine use: coffee daily and pepsi      Does not have a living will.   Daughters and husband know her wishes- would desire CPR but not prolonged life support if futile   The PMH, PSH, Social History, Family History, Medications, and allergies have been reviewed in [facility], and have been updated if relevant. .lat Review of Systems  Constitutional: Negative for fatigue and unexpected weight change.  HENT: Negative.   Eyes: Negative.   Respiratory: Negative.   Gastrointestinal: Negative.   Endocrine: Negative.   Genitourinary: Positive for vaginal discharge and vaginal pain.  Musculoskeletal: Negative for back pain.  Skin: Negative.   Allergic/Immunologic: Negative.   Neurological: Negative.   Hematological: Negative.   Psychiatric/Behavioral: Negative.  Negative for agitation.  All other systems reviewed and are negative.      Physical Exam  Constitutional: She is oriented to person, place, and time. She appears well-developed and well-nourished. No distress.  HENT:  Head: Normocephalic and atraumatic.  Eyes: Conjunctivae are normal.  Neck: Normal range of  motion. Neck supple. No thyromegaly present.  Cardiovascular: Normal rate, regular rhythm, normal heart sounds and intact distal pulses.   Pulmonary/Chest: Effort normal and breath sounds normal. No respiratory distress. She has no wheezes.  Abdominal: Soft.  Genitourinary:     Musculoskeletal: Normal range of motion. She exhibits no edema.  Neurological: She is alert and oriented to person, place, and time. No cranial nerve deficit.  Skin: Skin is warm and dry.  Psychiatric: She has a normal mood and affect. Her behavior is normal. Judgment and thought content normal.  Nursing note and vitals reviewed.  BP 136/84 mmHg  Pulse 66  Temp(Src) 98.2 F (36.8 C) (Oral)  Wt 141 lb 4 oz (64.071 kg)  SpO2 98% BP Readings from Last 3 Encounters:  12/03/15 136/84  11/06/15 133/65  10/12/15 158/98

## 2015-12-03 NOTE — Addendum Note (Signed)
Addended by: Lucille Passy on: 12/03/2015 02:46 PM   Modules accepted: Orders

## 2015-12-11 ENCOUNTER — Encounter: Payer: Self-pay | Admitting: Internal Medicine

## 2015-12-11 ENCOUNTER — Ambulatory Visit (INDEPENDENT_AMBULATORY_CARE_PROVIDER_SITE_OTHER): Payer: Medicare Other | Admitting: Internal Medicine

## 2015-12-11 VITALS — BP 124/78 | HR 85 | Temp 98.0°F | Resp 12 | Wt 145.0 lb

## 2015-12-11 DIAGNOSIS — E1151 Type 2 diabetes mellitus with diabetic peripheral angiopathy without gangrene: Secondary | ICD-10-CM

## 2015-12-11 DIAGNOSIS — I208 Other forms of angina pectoris: Secondary | ICD-10-CM | POA: Diagnosis not present

## 2015-12-11 MED ORDER — GLIPIZIDE 10 MG PO TABS: ORAL_TABLET | ORAL | Status: DC

## 2015-12-11 NOTE — Progress Notes (Signed)
Patient ID: Sarah Payne, female   DOB: 07/04/1946, 70 y.o.   MRN: [id number]  HPI: Sarah Payne is a 70 y.o.-year-old female, returning for f/u for DM2, dx 2003, insulin-dependent since 2013, uncontrolled, with complications (CAD, sCHF, PAD, CKD, PN) and also complicated by dementia and medication noncompliance. She also has hypothyroidism, also uncontrolled. Last visit >2 years ago!  DM2: Last hemoglobin A1c was: Lab Results  Component Value Date   HGBA1C 10.8* 09/20/2015   HGBA1C 8.6* 01/04/2015   HGBA1C 10.9* 08/02/2014   Pt is on a regimen of:   Breakfast Lunch Dinner Bedtime   Januvia 100 mg  1 tab     Glipizide 10 mg  1 tab  1 tab   Lantus     40 units   NovoLog  Skips b'fast 14 units  Not taking    She has been on NPH insulin in the past, which we were able to stop at last visit. Since then, she was started on Lantus and NovoLog by PCP.  Pt checks her sugars 4-5x a day and they are: - am: 145-288 >> 80-200 (max 286) >> 68-204 >> 78-204 >> 99-317 - 2h after b'fast: 156-267 >> ~100 >> 131, 135, 146, 149 >> 224, 355 - before lunch: 89-212 >> 159-185 >> 140-170 >> 78-178 >> 60-311 - 2h after lunch: 134-230 >> lowest 63-204 >> 60-189, most <140 >> 94-438 - before dinner: 64-219 >> 140-202 >> 98 >> 69-125 >> 68- 249 - 2h after dinner: 71, 305 >> ~100 >> 79-108 >> 370 - bedtime: 91-155 >> 119-170 >> ~100 >> 110-149 (253) >> see above. - nighttime: 54, 58, 145,  + lows. Lowest sugar was 54 >> 60; she has ? hypoglycemia awareness.  Highest sugar was 253 >> 398.  - + CKD, last BUN/creatinine:  Lab Results  Component Value Date   BUN 14 09/20/2015   CREATININE 1.06 09/20/2015  She is on Enalapril. - last set of lipids: Lab Results  Component Value Date   CHOL 339* 09/20/2015   HDL 57.10 09/20/2015   LDLCALC 146* 03/12/2015   LDLDIRECT 237.0 09/20/2015   TRIG 224.0* 09/20/2015   CHOLHDL 6 09/20/2015  She is on Lipitor.She is on Zetia. She is on ASA 81. - last eye exam  was in 2016. No DR. She had cataract sx. - + numbness and tingling in her feet.  Hypothyroidism: She is on levothyroxine 175 g daily  She takes levothyroxine: - in am, fasting - with water - Separated by more than 30 minutes from breakfast - On calcium, acid reflux medicines >> she takes them along with levothyroxine! - No multivitamins - No iron   Latest TFTs: Lab Results  Component Value Date   TSH 51.35* 09/20/2015   TSH 0.52 06/18/2015   TSH 53.23* 06/01/2015   TSH 40.79* 03/12/2015   TSH 53.83* 12/21/2014   TSH 60.34* 08/16/2014   TSH > 100 08/02/2014   TSH 0.99 04/26/2014   TSH 0.06* 11/22/2013   TSH 44.2* 08/19/2013   FREET4 0.27* 09/20/2015   FREET4 0.57* 03/12/2015   FREET4 0.21* 12/21/2014   FREET4 0.30* 08/16/2014   FREET4 1.03 04/26/2014   FREET4 1.58 11/22/2013   ROS: Constitutional: no more weight loss, + fatigue, + excessive urination, Especially nocturia Eyes: no more blurry vision, no xerophthalmia ENT: no sore throat, no nodules palpated in throat, no dysphagia/odynophagia, no hoarseness Cardiovascular: no CP/SOB/no palpitations/leg swelling Respiratory: no cough/SOB Gastrointestinal: no N/ V/no D/C Musculoskeletal: no muscle/joint  aches Skin: no rashes Neurological: no tremors/numbness/tingling/dizziness  I reviewed pt's medications, allergies, PMH, social hx, family hx, and changes were documented in the history of present illness. Otherwise, unchanged from my initial visit note.  PE: BP 124/78 mmHg  Pulse 85  Temp(Src) 98 F (36.7 C) (Oral)  Resp 12  Wt 145 lb (65.772 kg)  SpO2 98% Body mass index is 24.31 kg/(m^2). Wt Readings from Last 3 Encounters:  12/11/15 145 lb (65.772 kg)  12/03/15 141 lb 4 oz (64.071 kg)  11/06/15 147 lb (66.679 kg)   Constitutional: normal weight, in NAD Eyes: PERRLA, EOMI, no exophthalmos ENT: moist mucous membranes, no thyromegaly, no cervical lymphadenopathy Cardiovascular: RRR, No MRG Respiratory: CTA  B Gastrointestinal: abdomen soft, NT, ND, BS+ Musculoskeletal: no deformities, strength intact in all 4 Skin: moist, warm, no rashes, very dry skin Neurological: no tremor with outstretched hands, DTR normal in all 4  ASSESSMENT: 1. DM2, insulin-dependent, uncontrolled, with complications - med noncompliance (dementia) - CAD - had 3 AMI in 2011 >> 7 stents placed - sCHF - PAD - CKD - PN  2. Hypothyroidism - Uncontrolled  PLAN:  1. Patient with long-standing uncontrolled diabetes, on oral + insulin antidiabetic regimen, with history of poor med compliance, now returning after a long absence, with very poor control of her diabetes, despite starting a basal-bolus insulin regimen since last visit. She mentions that she is usually not taking the NovoLog except for the lunchtime dose, because she drops her sugars very low if she is takes it. We discussed that this is usually caused by too much NovoLog, and decided to reduce the doses so she can take it consistently. - Because her lows could also be due to too much basal insulin, with decreased the Lantus by 10 units and decreased the glipizide to 5 mg twice a day - I suggested to:  Patient Instructions  Please decrease: - Lantus to 30 units at bedtime - Novolog (take this 10 min before a meal): 5 units before a small meal 7 units before a regular meal 9 units before a larger meal - Glipizide to 5 mg 2x a day  Continue: - Januvia 100 mg daily in am, before b'fast    Breakfast Lunch Dinner Bedtime   Januvia 100 mg  1 tab     Glipizide 10 mg  1/2 tab  1/2 tab   Lantus     30 units   NovoLog  5-9 units 5-9 units 5-9 units    Please return in 1.5 months with your sugar log.   - continue checking sugars at different times of the day - check 3 times a day, rotating checks - She is up to date with eye exams >> needs a new one soon - Reviewed her latest HbA1c, which was very high, at 10.8% - Return to clinic in 1.5 mo with sugar  log   2. Hypothyroidism - Patient's thyroid tests have been very uncontrolled, and this could be from poor medication compliance +/- taking her levothyroxine along with calcium and PPIs. - We discussed about taking levothyroxine correctly: Every day, fasting, separated by at least 30 minutes from breakfast, and separated by at least 4 hours from calcium, iron, multivitamins, acid reflux medicines. She will move the calcium and acid reflux medicines more than 4 hours later.  - For now, will continue levothyroxine 175 g daily - I will recheck her TFTs at next visit   - We did discuss about the importance of getting  her thyroid disease under control and about consequences of prolonged hypothyroidism   - time spent with the patient: 40 minutes, of which >50% was spent in obtaining information about her diabetes and hypothyroidism, reviewing her previous labs, evaluations, and treatments, counseling her about both her endocrine conditions (please see the discussed topics above), and developing a plan to further treat them.

## 2015-12-11 NOTE — Patient Instructions (Signed)
Please continue Levothyroxine 175 mcg daily.  Take the thyroid hormone every day, with water, at least 30 minutes before breakfast, separated by at least 4 hours from: - acid reflux medications - calcium - iron - multivitamins  Please decrease: - Lantus to 30 units at bedtime - Novolog (take this 10 min before a meal): 5 units before a small meal 7 units before a regular meal 9 units before a larger meal - Glipizide to 5 mg 2x a day  Continue: - Januvia 100 mg daily in am, before b'fast    Breakfast Lunch Dinner Bedtime   Januvia 100 mg  1 tab     Glipizide 10 mg  1/2 tab  1/2 tab   Lantus     30 units   NovoLog  5-9 units 5-9 units 5-9 units    Please return in 1.5 months with your sugar log.

## 2015-12-28 ENCOUNTER — Ambulatory Visit: Payer: Medicare Other | Admitting: Gastroenterology

## 2016-01-01 ENCOUNTER — Other Ambulatory Visit: Payer: Self-pay | Admitting: *Deleted

## 2016-01-01 ENCOUNTER — Other Ambulatory Visit: Payer: Self-pay

## 2016-01-01 ENCOUNTER — Encounter (HOSPITAL_COMMUNITY)
Admission: EM | Disposition: A | Discharge status: Home / Self Care | Payer: Self-pay | Source: Home / Self Care | Attending: Cardiology

## 2016-01-01 ENCOUNTER — Telehealth: Payer: Self-pay | Admitting: Cardiovascular Disease

## 2016-01-01 ENCOUNTER — Emergency Department: Payer: Medicare Other

## 2016-01-01 ENCOUNTER — Ambulatory Visit (HOSPITAL_COMMUNITY): Admission: EM | Admit: 2016-01-01 | Payer: Medicare Other | Admitting: Interventional Cardiology

## 2016-01-01 ENCOUNTER — Inpatient Hospital Stay (HOSPITAL_COMMUNITY)
Admission: EM | Admit: 2016-01-01 | Discharge: 2016-01-02 | DRG: 286 | Disposition: A | Discharge status: Home / Self Care | Payer: Medicare Other | Attending: Cardiology | Admitting: Cardiology

## 2016-01-01 ENCOUNTER — Emergency Department
Admission: EM | Admit: 2016-01-01 | Discharge: 2016-01-01 | Disposition: A | Discharge status: Other Acute Inpatient Hospital | Payer: Medicare Other | Attending: Student | Admitting: Student

## 2016-01-01 ENCOUNTER — Encounter: Payer: Self-pay | Admitting: Emergency Medicine

## 2016-01-01 ENCOUNTER — Encounter (HOSPITAL_COMMUNITY): Payer: Self-pay | Admitting: *Deleted

## 2016-01-01 DIAGNOSIS — F17210 Nicotine dependence, cigarettes, uncomplicated: Secondary | ICD-10-CM | POA: Diagnosis not present

## 2016-01-01 DIAGNOSIS — Z794 Long term (current) use of insulin: Secondary | ICD-10-CM | POA: Diagnosis not present

## 2016-01-01 DIAGNOSIS — F17200 Nicotine dependence, unspecified, uncomplicated: Secondary | ICD-10-CM | POA: Diagnosis present

## 2016-01-01 DIAGNOSIS — E785 Hyperlipidemia, unspecified: Secondary | ICD-10-CM | POA: Diagnosis not present

## 2016-01-01 DIAGNOSIS — F329 Major depressive disorder, single episode, unspecified: Secondary | ICD-10-CM | POA: Diagnosis present

## 2016-01-01 DIAGNOSIS — G8929 Other chronic pain: Secondary | ICD-10-CM | POA: Diagnosis present

## 2016-01-01 DIAGNOSIS — I25119 Atherosclerotic heart disease of native coronary artery with unspecified angina pectoris: Secondary | ICD-10-CM | POA: Diagnosis present

## 2016-01-01 DIAGNOSIS — E119 Type 2 diabetes mellitus without complications: Secondary | ICD-10-CM | POA: Insufficient documentation

## 2016-01-01 DIAGNOSIS — I10 Essential (primary) hypertension: Secondary | ICD-10-CM | POA: Insufficient documentation

## 2016-01-01 DIAGNOSIS — Z720 Tobacco use: Secondary | ICD-10-CM | POA: Diagnosis not present

## 2016-01-01 DIAGNOSIS — R079 Chest pain, unspecified: Secondary | ICD-10-CM

## 2016-01-01 DIAGNOSIS — I493 Ventricular premature depolarization: Secondary | ICD-10-CM

## 2016-01-01 DIAGNOSIS — J449 Chronic obstructive pulmonary disease, unspecified: Secondary | ICD-10-CM | POA: Insufficient documentation

## 2016-01-01 DIAGNOSIS — I5181 Takotsubo syndrome: Secondary | ICD-10-CM | POA: Diagnosis not present

## 2016-01-01 DIAGNOSIS — M545 Low back pain: Secondary | ICD-10-CM | POA: Diagnosis present

## 2016-01-01 DIAGNOSIS — I213 ST elevation (STEMI) myocardial infarction of unspecified site: Secondary | ICD-10-CM | POA: Diagnosis not present

## 2016-01-01 DIAGNOSIS — Z7982 Long term (current) use of aspirin: Secondary | ICD-10-CM

## 2016-01-01 DIAGNOSIS — I429 Cardiomyopathy, unspecified: Secondary | ICD-10-CM | POA: Diagnosis not present

## 2016-01-01 DIAGNOSIS — I5021 Acute systolic (congestive) heart failure: Secondary | ICD-10-CM

## 2016-01-01 DIAGNOSIS — F430 Acute stress reaction: Secondary | ICD-10-CM

## 2016-01-01 DIAGNOSIS — F1210 Cannabis abuse, uncomplicated: Secondary | ICD-10-CM | POA: Diagnosis not present

## 2016-01-01 DIAGNOSIS — Z7902 Long term (current) use of antithrombotics/antiplatelets: Secondary | ICD-10-CM

## 2016-01-01 DIAGNOSIS — Z955 Presence of coronary angioplasty implant and graft: Secondary | ICD-10-CM | POA: Diagnosis not present

## 2016-01-01 DIAGNOSIS — E1151 Type 2 diabetes mellitus with diabetic peripheral angiopathy without gangrene: Secondary | ICD-10-CM | POA: Diagnosis not present

## 2016-01-01 DIAGNOSIS — K219 Gastro-esophageal reflux disease without esophagitis: Secondary | ICD-10-CM | POA: Diagnosis present

## 2016-01-01 DIAGNOSIS — E782 Mixed hyperlipidemia: Secondary | ICD-10-CM | POA: Diagnosis present

## 2016-01-01 DIAGNOSIS — E039 Hypothyroidism, unspecified: Secondary | ICD-10-CM | POA: Diagnosis present

## 2016-01-01 DIAGNOSIS — I110 Hypertensive heart disease with heart failure: Secondary | ICD-10-CM | POA: Diagnosis present

## 2016-01-01 DIAGNOSIS — I252 Old myocardial infarction: Secondary | ICD-10-CM

## 2016-01-01 DIAGNOSIS — I2121 ST elevation (STEMI) myocardial infarction involving left circumflex coronary artery: Secondary | ICD-10-CM | POA: Diagnosis not present

## 2016-01-01 DIAGNOSIS — I425 Other restrictive cardiomyopathy: Secondary | ICD-10-CM | POA: Diagnosis not present

## 2016-01-01 DIAGNOSIS — Z8249 Family history of ischemic heart disease and other diseases of the circulatory system: Secondary | ICD-10-CM

## 2016-01-01 DIAGNOSIS — Z79899 Other long term (current) drug therapy: Secondary | ICD-10-CM | POA: Insufficient documentation

## 2016-01-01 DIAGNOSIS — I2129 ST elevation (STEMI) myocardial infarction involving other sites: Secondary | ICD-10-CM | POA: Insufficient documentation

## 2016-01-01 DIAGNOSIS — I2510 Atherosclerotic heart disease of native coronary artery without angina pectoris: Secondary | ICD-10-CM | POA: Diagnosis present

## 2016-01-01 DIAGNOSIS — F0150 Vascular dementia without behavioral disturbance: Secondary | ICD-10-CM | POA: Diagnosis present

## 2016-01-01 HISTORY — DX: Cannabis abuse, uncomplicated: F12.10

## 2016-01-01 HISTORY — DX: Ventricular premature depolarization: I49.3

## 2016-01-01 HISTORY — DX: Takotsubo syndrome: I51.81

## 2016-01-01 HISTORY — DX: Chronic obstructive pulmonary disease, unspecified: J44.9

## 2016-01-01 HISTORY — PX: CARDIAC CATHETERIZATION: SHX172

## 2016-01-01 HISTORY — DX: Acute systolic (congestive) heart failure: I50.21

## 2016-01-01 LAB — MRSA PCR SCREENING: MRSA by PCR: NEGATIVE

## 2016-01-01 LAB — BASIC METABOLIC PANEL
Anion gap: 11 (ref 5–15)
Anion gap: 8 (ref 5–15)
BUN: 16 mg/dL (ref 6–20)
BUN: 13 mg/dL (ref 6–20)
CO2: 24 mmol/L (ref 22–32)
CO2: 24 mmol/L (ref 22–32)
Calcium: 9.6 mg/dL (ref 8.9–10.3)
Calcium: 9.6 mg/dL (ref 8.9–10.3)
Chloride: 100 mmol/L — ABNORMAL LOW (ref 101–111)
Chloride: 104 mmol/L (ref 101–111)
Creatinine, Ser: 0.99 mg/dL (ref 0.44–1.00)
Creatinine, Ser: 0.8 mg/dL (ref 0.44–1.00)
GFR calc Af Amer: >60 mL/min (ref >60)
GFR calc Af Amer: >60 mL/min (ref >60)
GFR calc non Af Amer: 58 mL/min — ABNORMAL LOW (ref >60)
GFR calc non Af Amer: >60 mL/min (ref >60)
Glucose, Bld: 292 mg/dL — ABNORMAL HIGH (ref 65–99)
Glucose, Bld: 261 mg/dL — ABNORMAL HIGH (ref 65–99)
Potassium: 3.8 mmol/L (ref 3.5–5.1)
Potassium: 3.8 mmol/L (ref 3.5–5.1)
Sodium: 135 mmol/L (ref 135–145)
Sodium: 136 mmol/L (ref 135–145)

## 2016-01-01 LAB — POCT I-STAT, CHEM 8
BUN: 14 mg/dL (ref 6–20)
Calcium, Ion: 1.2 mmol/L (ref 1.13–1.30)
Chloride: 101 mmol/L (ref 101–111)
Creatinine, Ser: 0.8 mg/dL (ref 0.44–1.00)
Glucose, Bld: 266 mg/dL — ABNORMAL HIGH (ref 65–99)
HCT: 40 % (ref 36.0–46.0)
Hemoglobin: 13.6 g/dL (ref 12.0–15.0)
Potassium: 3.8 mmol/L (ref 3.5–5.1)
Sodium: 137 mmol/L (ref 135–145)
TCO2: 23 mmol/L (ref 0–100)

## 2016-01-01 LAB — CBC WITH DIFFERENTIAL/PLATELET
Basophils Absolute: 0 10*3/uL (ref 0.0–0.1)
Basophils Relative: 0 %
Eosinophils Absolute: 0.1 10*3/uL (ref 0.0–0.7)
Eosinophils Relative: 1 %
HCT: 38.8 % (ref 36.0–46.0)
Hemoglobin: 12.9 g/dL (ref 12.0–15.0)
Lymphocytes Relative: 24 %
Lymphs Abs: 2.1 10*3/uL (ref 0.7–4.0)
MCH: 30.4 pg (ref 26.0–34.0)
MCHC: 33.2 g/dL (ref 30.0–36.0)
MCV: 91.3 fL (ref 78.0–100.0)
Monocytes Absolute: 0.7 10*3/uL (ref 0.1–1.0)
Monocytes Relative: 7 %
Neutro Abs: 6 10*3/uL (ref 1.7–7.7)
Neutrophils Relative %: 68 %
Platelets: 251 10*3/uL (ref 150–400)
RBC: 4.25 MIL/uL (ref 3.87–5.11)
RDW: 13.2 % (ref 11.5–15.5)
WBC: 8.8 10*3/uL (ref 4.0–10.5)

## 2016-01-01 LAB — CBC
HCT: 40.5 % (ref 35.0–47.0)
Hemoglobin: 13.7 g/dL (ref 12.0–16.0)
MCH: 31 pg (ref 26.0–34.0)
MCHC: 33.8 g/dL (ref 32.0–36.0)
MCV: 91.9 fL (ref 80.0–100.0)
Platelets: 248 10*3/uL (ref 150–440)
RBC: 4.41 MIL/uL (ref 3.80–5.20)
RDW: 13.8 % (ref 11.5–14.5)
WBC: 7.5 10*3/uL (ref 3.6–11.0)

## 2016-01-01 LAB — APTT
aPTT: 31 seconds (ref 24–36)
aPTT: 109 seconds — ABNORMAL HIGH (ref 24–37)

## 2016-01-01 LAB — PROTIME-INR
INR: 0.92
INR: 1.01 (ref 0.00–1.49)
Prothrombin Time: 12.6 seconds (ref 11.4–15.0)
Prothrombin Time: 13.5 seconds (ref 11.6–15.2)

## 2016-01-01 LAB — POCT I-STAT TROPONIN I: Troponin i, poc: 1.67 ng/mL (ref 0.00–0.08)

## 2016-01-01 LAB — GLUCOSE, CAPILLARY
Glucose-Capillary: 218 mg/dL — ABNORMAL HIGH (ref 65–99)
Glucose-Capillary: 206 mg/dL — ABNORMAL HIGH (ref 65–99)

## 2016-01-01 LAB — TROPONIN I: Troponin I: 0.81 ng/mL — ABNORMAL HIGH (ref <0.031)

## 2016-01-01 LAB — POCT ACTIVATED CLOTTING TIME: Activated Clotting Time: 153 seconds

## 2016-01-01 SURGERY — LEFT HEART CATH AND CORONARY ANGIOGRAPHY
Anesthesia: LOCAL

## 2016-01-01 MED ORDER — LIDOCAINE HCL (PF) 1 % IJ SOLN
INTRAMUSCULAR | Status: DC | PRN
  Administered 2016-01-01: 14 mL

## 2016-01-01 MED ORDER — INSULIN GLARGINE 100 UNIT/ML ~~LOC~~ SOLN
30.0000 [IU] | Freq: Every day | SUBCUTANEOUS | Status: DC
  Administered 2016-01-01: 30 [IU] via SUBCUTANEOUS
  Filled 2016-01-01 (×2): qty 0.3

## 2016-01-01 MED ORDER — ALPRAZOLAM 0.25 MG PO TABS: 0.2500 mg | ORAL_TABLET | Freq: Two times a day (BID) | ORAL | Status: DC | PRN

## 2016-01-01 MED ORDER — EZETIMIBE 10 MG PO TABS: 10.0000 mg | ORAL_TABLET | Freq: Every day | ORAL | Status: DC

## 2016-01-01 MED ORDER — CARVEDILOL 6.25 MG PO TABS
6.2500 mg | ORAL_TABLET | Freq: Two times a day (BID) | ORAL | Status: DC
Start: 2016-01-01 — End: 2016-01-01

## 2016-01-01 MED ORDER — LEVOTHYROXINE SODIUM 75 MCG PO TABS
175.0000 ug | ORAL_TABLET | Freq: Every day | ORAL | Status: DC
  Administered 2016-01-02: 175 ug via ORAL
  Filled 2016-01-01: qty 1

## 2016-01-01 MED ORDER — SODIUM CHLORIDE 0.9% FLUSH: 3.0000 mL | INTRAVENOUS | Status: DC | PRN

## 2016-01-01 MED ORDER — IOPAMIDOL (ISOVUE-370) INJECTION 76%
INTRAVENOUS | Status: AC
  Filled 2016-01-01: qty 100

## 2016-01-01 MED ORDER — HEPARIN (PORCINE) IN NACL 2-0.9 UNIT/ML-% IJ SOLN
INTRAMUSCULAR | Status: DC | PRN
  Administered 2016-01-01: 1000 mL

## 2016-01-01 MED ORDER — LIDOCAINE HCL (PF) 1 % IJ SOLN
INTRAMUSCULAR | Status: AC
  Filled 2016-01-01: qty 30

## 2016-01-01 MED ORDER — TRAZODONE HCL 50 MG PO TABS
100.0000 mg | ORAL_TABLET | Freq: Every evening | ORAL | Status: DC | PRN
  Administered 2016-01-02: 100 mg via ORAL
  Filled 2016-01-01: qty 2

## 2016-01-01 MED ORDER — FENTANYL CITRATE (PF) 100 MCG/2ML IJ SOLN
INTRAMUSCULAR | Status: DC | PRN
  Administered 2016-01-01: 25 ug via INTRAVENOUS

## 2016-01-01 MED ORDER — ACETAMINOPHEN 325 MG PO TABS
650.0000 mg | ORAL_TABLET | ORAL | Status: DC | PRN
  Administered 2016-01-02: 650 mg via ORAL
  Filled 2016-01-01: qty 2

## 2016-01-01 MED ORDER — MIDAZOLAM HCL 2 MG/2ML IJ SOLN
INTRAMUSCULAR | Status: DC | PRN
  Administered 2016-01-01: 1 mg via INTRAVENOUS

## 2016-01-01 MED ORDER — FENTANYL CITRATE (PF) 100 MCG/2ML IJ SOLN
INTRAMUSCULAR | Status: AC
  Filled 2016-01-01: qty 2

## 2016-01-01 MED ORDER — CLOPIDOGREL BISULFATE 75 MG PO TABS: 75.0000 mg | ORAL_TABLET | Freq: Every day | ORAL | Status: DC

## 2016-01-01 MED ORDER — INSULIN GLARGINE 100 UNIT/ML SOLOSTAR PEN
30.0000 [IU] | PEN_INJECTOR | SUBCUTANEOUS | Status: DC
Start: 2016-01-01 — End: 2016-01-01

## 2016-01-01 MED ORDER — GLIPIZIDE 5 MG PO TABS
5.0000 mg | ORAL_TABLET | Freq: Two times a day (BID) | ORAL | Status: DC
  Administered 2016-01-01 – 2016-01-02 (×2): 5 mg via ORAL
  Filled 2016-01-01 (×4): qty 1

## 2016-01-01 MED ORDER — DULOXETINE HCL 60 MG PO CPEP: 60.0000 mg | ORAL_CAPSULE | Freq: Every day | ORAL | Status: DC

## 2016-01-01 MED ORDER — CLOPIDOGREL BISULFATE 75 MG PO TABS
75.0000 mg | ORAL_TABLET | Freq: Every day | ORAL | Status: DC
  Administered 2016-01-02: 75 mg via ORAL
  Filled 2016-01-01: qty 1

## 2016-01-01 MED ORDER — LORAZEPAM 2 MG/ML IJ SOLN
0.5000 mg | Freq: Once | INTRAMUSCULAR | Status: AC
  Administered 2016-01-01: 0.5 mg via INTRAVENOUS

## 2016-01-01 MED ORDER — PANTOPRAZOLE SODIUM 40 MG PO TBEC: 40.0000 mg | DELAYED_RELEASE_TABLET | Freq: Every day | ORAL | Status: DC

## 2016-01-01 MED ORDER — ENALAPRIL MALEATE 5 MG PO TABS
5.0000 mg | ORAL_TABLET | Freq: Every day | ORAL | Status: DC
Start: 2016-01-02 — End: 2016-01-02
  Administered 2016-01-02: 5 mg via ORAL
  Filled 2016-01-01: qty 1

## 2016-01-01 MED ORDER — ONDANSETRON HCL 4 MG/2ML IJ SOLN: 4.0000 mg | Freq: Four times a day (QID) | INTRAMUSCULAR | Status: DC | PRN

## 2016-01-01 MED ORDER — ENALAPRIL MALEATE 5 MG PO TABS: 5.0000 mg | ORAL_TABLET | Freq: Every day | ORAL | Status: DC

## 2016-01-01 MED ORDER — LORAZEPAM 2 MG/ML IJ SOLN
INTRAMUSCULAR | Status: AC
  Administered 2016-01-01: 0.5 mg via INTRAVENOUS
  Filled 2016-01-01: qty 1

## 2016-01-01 MED ORDER — INSULIN ASPART 100 UNIT/ML ~~LOC~~ SOLN
9.0000 [IU] | Freq: Three times a day (TID) | SUBCUTANEOUS | Status: DC
Start: 2016-01-02 — End: 2016-01-02
  Administered 2016-01-02: 9 [IU] via SUBCUTANEOUS

## 2016-01-01 MED ORDER — PANTOPRAZOLE SODIUM 40 MG PO TBEC
40.0000 mg | DELAYED_RELEASE_TABLET | Freq: Every day | ORAL | Status: DC
Start: 2016-01-02 — End: 2016-01-02
  Administered 2016-01-02: 40 mg via ORAL
  Filled 2016-01-01: qty 1

## 2016-01-01 MED ORDER — HEPARIN (PORCINE) IN NACL 2-0.9 UNIT/ML-% IJ SOLN
INTRAMUSCULAR | Status: AC
  Filled 2016-01-01: qty 1000

## 2016-01-01 MED ORDER — ASPIRIN EC 81 MG PO TBEC
81.0000 mg | DELAYED_RELEASE_TABLET | Freq: Every day | ORAL | Status: DC
  Administered 2016-01-02: 81 mg via ORAL
  Filled 2016-01-01: qty 1

## 2016-01-01 MED ORDER — HEPARIN BOLUS VIA INFUSION: 3800.0000 [IU] | Freq: Once | INTRAVENOUS | Status: DC

## 2016-01-01 MED ORDER — METOCLOPRAMIDE HCL 10 MG PO TABS
10.0000 mg | ORAL_TABLET | Freq: Four times a day (QID) | ORAL | Status: DC
  Administered 2016-01-01 – 2016-01-02 (×3): 10 mg via ORAL
  Filled 2016-01-01 (×3): qty 1

## 2016-01-01 MED ORDER — ASPIRIN 81 MG PO TABS: 81.0000 mg | ORAL_TABLET | Freq: Every day | ORAL | Status: DC

## 2016-01-01 MED ORDER — IOPAMIDOL (ISOVUE-370) INJECTION 76%
INTRAVENOUS | Status: DC | PRN
  Administered 2016-01-01: 40 mL via INTRAVENOUS

## 2016-01-01 MED ORDER — ATORVASTATIN CALCIUM 80 MG PO TABS
80.0000 mg | ORAL_TABLET | Freq: Every day | ORAL | Status: DC
Start: 2016-01-01 — End: 2016-01-01

## 2016-01-01 MED ORDER — ASPIRIN 81 MG PO CHEW
324.0000 mg | CHEWABLE_TABLET | Freq: Once | ORAL | Status: AC
  Administered 2016-01-01: 324 mg via ORAL

## 2016-01-01 MED ORDER — MIDAZOLAM HCL 2 MG/2ML IJ SOLN
INTRAMUSCULAR | Status: AC
  Filled 2016-01-01: qty 2

## 2016-01-01 MED ORDER — EZETIMIBE 10 MG PO TABS
10.0000 mg | ORAL_TABLET | Freq: Every day | ORAL | Status: DC
  Administered 2016-01-02: 10 mg via ORAL
  Filled 2016-01-01: qty 1

## 2016-01-01 MED ORDER — ISOSORBIDE MONONITRATE ER 30 MG PO TB24: 30.0000 mg | ORAL_TABLET | Freq: Every day | ORAL | Status: DC

## 2016-01-01 MED ORDER — ONDANSETRON HCL 4 MG PO TABS: 4.0000 mg | ORAL_TABLET | Freq: Three times a day (TID) | ORAL | Status: DC | PRN

## 2016-01-01 MED ORDER — LINAGLIPTIN 5 MG PO TABS
5.0000 mg | ORAL_TABLET | Freq: Every day | ORAL | Status: DC
  Administered 2016-01-02: 5 mg via ORAL
  Filled 2016-01-01: qty 1

## 2016-01-01 MED ORDER — CLOPIDOGREL BISULFATE 75 MG PO TABS
600.0000 mg | ORAL_TABLET | Freq: Once | ORAL | Status: AC
Start: 2016-01-01 — End: 2016-01-01
  Administered 2016-01-01: 600 mg via ORAL

## 2016-01-01 MED ORDER — ONDANSETRON HCL 4 MG/2ML IJ SOLN
4.0000 mg | Freq: Once | INTRAMUSCULAR | Status: AC
  Administered 2016-01-01: 4 mg via INTRAVENOUS

## 2016-01-01 MED ORDER — NITROGLYCERIN 0.4 MG SL SUBL: 0.4000 mg | SUBLINGUAL_TABLET | SUBLINGUAL | Status: DC | PRN

## 2016-01-01 MED ORDER — HEPARIN SODIUM (PORCINE) 5000 UNIT/ML IJ SOLN
60.0000 [IU]/kg | Freq: Once | INTRAMUSCULAR | Status: AC
  Administered 2016-01-01: 3800 [IU] via INTRAVENOUS

## 2016-01-01 MED ORDER — NITROGLYCERIN 0.4 MG SL SUBL
0.4000 mg | SUBLINGUAL_TABLET | SUBLINGUAL | Status: DC | PRN
  Administered 2016-01-01: 0.4 mg via SUBLINGUAL

## 2016-01-01 MED ORDER — VERAPAMIL HCL 2.5 MG/ML IV SOLN
INTRAVENOUS | Status: AC
  Filled 2016-01-01: qty 2

## 2016-01-01 MED ORDER — BOOST / RESOURCE BREEZE PO LIQD
1.0000 | Freq: Three times a day (TID) | ORAL | Status: DC
  Administered 2016-01-01 – 2016-01-02 (×2): 1 via ORAL

## 2016-01-01 MED ORDER — HEPARIN SODIUM (PORCINE) 1000 UNIT/ML IJ SOLN
INTRAMUSCULAR | Status: AC
  Filled 2016-01-01: qty 1

## 2016-01-01 MED ORDER — SODIUM CHLORIDE 0.9 % IV SOLN
INTRAVENOUS | Status: DC | PRN
  Administered 2016-01-01: 75 mL/h via INTRAVENOUS

## 2016-01-01 MED ORDER — SODIUM CHLORIDE 0.9% FLUSH
3.0000 mL | Freq: Two times a day (BID) | INTRAVENOUS | Status: DC
  Administered 2016-01-02: 3 mL via INTRAVENOUS

## 2016-01-01 MED ORDER — ISOSORBIDE MONONITRATE ER 30 MG PO TB24
30.0000 mg | ORAL_TABLET | Freq: Every day | ORAL | Status: DC
  Administered 2016-01-02: 30 mg via ORAL
  Filled 2016-01-01: qty 1

## 2016-01-01 MED ORDER — SODIUM CHLORIDE 0.9% FLUSH
3.0000 mL | Freq: Two times a day (BID) | INTRAVENOUS | Status: DC
  Administered 2016-01-01 – 2016-01-02 (×3): 3 mL via INTRAVENOUS

## 2016-01-01 MED ORDER — DULOXETINE HCL 60 MG PO CPEP
60.0000 mg | ORAL_CAPSULE | Freq: Every day | ORAL | Status: DC
Start: 2016-01-02 — End: 2016-01-02
  Administered 2016-01-02: 60 mg via ORAL
  Filled 2016-01-01: qty 1

## 2016-01-01 MED ORDER — ATORVASTATIN CALCIUM 80 MG PO TABS
80.0000 mg | ORAL_TABLET | Freq: Every day | ORAL | Status: DC
Start: 2016-01-02 — End: 2016-01-02
  Administered 2016-01-02: 80 mg via ORAL
  Filled 2016-01-01: qty 1

## 2016-01-01 MED ORDER — INSULIN ASPART 100 UNIT/ML FLEXPEN: 5.0000 [IU] | PEN_INJECTOR | Freq: Three times a day (TID) | SUBCUTANEOUS | Status: DC

## 2016-01-01 MED ORDER — CARVEDILOL 6.25 MG PO TABS
6.2500 mg | ORAL_TABLET | Freq: Two times a day (BID) | ORAL | Status: DC
  Administered 2016-01-01 – 2016-01-02 (×2): 6.25 mg via ORAL
  Filled 2016-01-01 (×2): qty 1

## 2016-01-01 MED ORDER — SODIUM CHLORIDE 0.9 % IV SOLN: 250.0000 mL | INTRAVENOUS | Status: DC | PRN

## 2016-01-01 MED ORDER — ONDANSETRON HCL 4 MG/2ML IJ SOLN
INTRAMUSCULAR | Status: AC
  Administered 2016-01-01: 4 mg via INTRAVENOUS
  Filled 2016-01-01: qty 2

## 2016-01-01 MED ORDER — HEPARIN (PORCINE) IN NACL 100-0.45 UNIT/ML-% IJ SOLN: 750.0000 [IU]/h | INTRAMUSCULAR | Status: DC

## 2016-01-01 MED ORDER — SODIUM CHLORIDE 0.9 % IV SOLN
250.0000 mL | INTRAVENOUS | Status: DC | PRN
  Administered 2016-01-01: 10 mL/h via INTRAVENOUS

## 2016-01-01 SURGICAL SUPPLY — 10 items
CATH INFINITI 5FR MULTPACK ANG (CATHETERS) ×2 IMPLANT
GLIDESHEATH SLEND SS 6F .021 (SHEATH) IMPLANT
KIT HEART LEFT (KITS) ×2 IMPLANT
PACK CARDIAC CATHETERIZATION (CUSTOM PROCEDURE TRAY) ×2 IMPLANT
SHEATH PINNACLE 6F 10CM (SHEATH) ×2 IMPLANT
SYR MEDRAD MARK V 150ML (SYRINGE) ×2 IMPLANT
TRANSDUCER W/STOPCOCK (MISCELLANEOUS) ×2 IMPLANT
TUBING CIL FLEX 10 FLL-RA (TUBING) ×2 IMPLANT
WIRE EMERALD 3MM-J .035X150CM (WIRE) ×2 IMPLANT
WIRE SAFE-T 1.5MM-J .035X260CM (WIRE) IMPLANT

## 2016-01-01 NOTE — H&P (Addendum)
Patient ID: Sarah Payne MRN: [id number], DOB/AGE: [birth year]   Admit date: 01/01/2016   Primary Physician: Arnette Norris, MD Primary Cardiologist:Dr Gollan  Pt. Profile:  STEMI  Problem List  Past Medical History  Diagnosis Date  . Diabetes mellitus   . Hypertension   . Hypothyroidism   . Depression   . Vascular dementia   . Tobacco abuse     a. 45 pack year history - quit 09/16/11  . Chronic low back pain   . Coronary artery disease     a. s/p PCI/DES LCX x 3;  b. NSTEMI 09/16/11;  c.   09/17/11 Cath [facility]) Severe RCA/LCX dzs - RCA treated, LCX intervention pending w/ Dr. Clayborn Bigness  . Sleep apnea     mild-does not use cpap  . Impingement syndrome of left shoulder   . Tendonitis of left rotator cuff   . Diverticulosis   . Myocardial infarction ([place])     x 5  . GERD (gastroesophageal reflux disease)   . Anginal pain ([place])   . Cataract     BILATERAL REMOVED  . Hyperlipidemia     Past Surgical History  Procedure Laterality Date  . Back surgery    . Appendectomy    . Abdominal hysterectomy    . Colonoscopy N/A 11/02/2014    Procedure: COLONOSCOPY;  Surgeon: Inda Castle, MD;  Location: [place];  Service: Endoscopy;  Laterality: N/A;  . Cataract extraction w/phaco Left 01/30/2015    Procedure: CATARACT EXTRACTION PHACO AND INTRAOCULAR LENS PLACEMENT (IOC);  Surgeon: Birder Robson, MD;  Location: [facility];  Service: Ophthalmology;  Laterality: Left;  [place] 00:47   . Cardiac catheterization  2013  . Coronary angioplasty  2012    stent x 3   . Coronary angioplasty with stent placement  2013  . Cardiac catheterization  2016  . Cataract extraction w/phaco Right 02/13/2015    Procedure: CATARACT EXTRACTION PHACO AND INTRAOCULAR LENS PLACEMENT (IOC);  Surgeon: Birder Robson, MD;  Location: [facility];  Service: Ophthalmology;  Laterality: Right;  cassette lot # [id number] H [place]  00:29.9 AP  20.7 CDE  6.20     Allergies  Allergies  Allergen Reactions  . No Known  Allergies     HPI  Patient is a 70 y.o. female with a PMHx of coronary artery disease, prior stents times 3 laced into the mid left circumflex with moderate to severe LAD disease , stent placed to her distal RCA in February 2013, stent to the mid left circumflex in March 2013 , poorly controlled diabetes with hemoglobin A1c of 10, depression, hypertension, cardiac catheterization 08/19/2012 showing moderate mid LAD disease at the takeoff of the diagonal vessel, severe ostial to mid RCA disease, ejection fraction greater than 55%. She had FFR pressure wire showing severe disease of the RCA with drug-eluting stent x2 placed. She is an ongoing smoker.  Patient has been through incredible stress, her nephew committed suicide a week ago and her sister passed away this morning. She developed acute retrosternal chest tightness radiating to her back, she states this is her typical angina slightly milder than her prior heart attacks.Her pain improved with nitroglycerin that she received in the ambulance. Currently 2 out of 10. She has mild shortness of breath no diaphoresis no nausea. Until yesterday she had no orthopnea, paroxysmal nocturnal dyspnea no palpitations or syncope. She has been compliant to all of her medicines.  Home Medications  Prior to Admission medications   Medication Sig Start Date  End Date Taking? Authorizing Provider  aspirin 81 MG tablet Take 81 mg by mouth daily.      Historical Provider, MD  atorvastatin (LIPITOR) 80 MG tablet Take 1 tablet (80 mg total) by mouth daily. 08/08/15   Minna Merritts, MD  Calcium & Magnesium Carbonates (MYLANTA PO) Take by mouth.      Historical Provider, MD  carvedilol (COREG) 3.125 MG tablet Take 2 tablets (6.25 mg total) by mouth 2 (two) times daily. 08/08/14   Minna Merritts, MD  clopidogrel (PLAVIX) 75 MG tablet TAKE 1 TABLET (75 MG TOTAL) BY MOUTH DAILY. 04/26/14   Lucille Passy, MD  DULoxetine (CYMBALTA) 60 MG capsule TAKE 1 CAPSULE BY MOUTH  DAILY. 12/03/15   Lucille Passy, MD  enalapril (VASOTEC) 5 MG tablet Take 1 tablet (5 mg total) by mouth daily. 12/03/15   Lucille Passy, MD  ezetimibe (ZETIA) 10 MG tablet Take 1 tablet (10 mg total) by mouth daily. 08/08/15   Minna Merritts, MD  fluconazole (DIFLUCAN) 150 MG tablet 1 tab by mouth every 3 days 12/03/15   Lucille Passy, MD  glipiZIDE (GLUCOTROL) 10 MG tablet Take 5 mg Before breakfast and supper 12/11/15   Philemon Kingdom, MD  glucose blood (FREESTYLE LITE) test strip Check blood sugar 3 times daily to check blood sugars - Uncontrolled diabetes mellitus 05/18/13   Philemon Kingdom, MD  glucose monitoring kit (FREESTYLE) monitoring kit 1 each by Does not apply route as needed for other. PHARMACY PLEASE SUBSTITUTE WHATEVER GLUCOMETER YOU CARRY 06/02/12   Crecencio Mc, MD  insulin aspart (NOVOLOG FLEXPEN) 100 UNIT/ML FlexPen Inject 5-9 Units into the skin 3 (three) times daily with meals.    Historical Provider, MD  Insulin Glargine (LANTUS SOLOSTAR) 100 UNIT/ML Solostar Pen Inject 30 Units into the skin as directed.    Historical Provider, MD  isosorbide mononitrate (IMDUR) 30 MG 24 hr tablet Take 1 tablet (30 mg total) by mouth daily. 04/26/14   Lucille Passy, MD  lansoprazole (PREVACID) 15 MG capsule Take by mouth. Reported on 11/06/2015    Historical Provider, MD  levothyroxine (SYNTHROID, LEVOTHROID) 175 MCG tablet TAKE ONE TABLET BY MOUTH ONCE DAILY BEFORE BREAKFAST 11/28/15   Lucille Passy, MD  magnesium 30 MG tablet Take by mouth. Reported on 09/20/2015    Historical Provider, MD  metoCLOPramide (REGLAN) 10 MG tablet Take 1 tablet (10 mg total) by mouth 4 (four) times daily. 06/18/15   Lucille Passy, MD  nitroGLYCERIN (NITROSTAT) 0.4 MG SL tablet Place 1 tablet (0.4 mg total) under the tongue every 5 (five) minutes as needed. 05/08/14   Minna Merritts, MD  ondansetron (ZOFRAN) 4 MG tablet Take 1 tab every 6 hours as needed for nausea. 10/24/15   Jessica D Zehr, PA-C  pantoprazole  (PROTONIX) 40 MG tablet Take 1 tablet (40 mg total) by mouth daily. 10/12/15   Jessica D Zehr, PA-C  sitaGLIPtin (JANUVIA) 100 MG tablet Take 1 tablet (100 mg total) by mouth daily. 04/26/14   Lucille Passy, MD  traZODone (DESYREL) 100 MG tablet TAKE 1 TABLET (100 MG TOTAL) BY MOUTH AT BEDTIME. 12/03/15   Lucille Passy, MD    Family History  Family History  Problem Relation Age of Onset  . Heart attack Mother     First MI @ 8 - Died @ 72  . Heart disease Mother   . Heart disease Father     Died @ 34  .  Throat cancer Brother   . Liver cancer Brother   . Colon cancer Sister     Social History  Social History   Social History  . Marital Status: Married    Spouse Name: N/A  . Number of Children: N/A  . Years of Education: N/A   Occupational History  . Not on file.   Social History Main Topics  . Smoking status: Current Every Day Smoker -- 0.25 packs/day for 45 years    Types: Cigarettes  . Smokeless tobacco: Never Used     Comment: Has cut back, trying to quit.   . Alcohol Use: No  . Drug Use: Yes    Special: Marijuana     Comment: last night 4.13.16  . Sexual Activity: Not on file   Other Topics Concern  . Not on file   Social History Narrative   Lives at home with her husband in [place].  Previously used marijuana - quit.      Regular exercise: no/ pain from a frozen rotator cuff   Caffeine use: coffee daily and pepsi      Does not have a living will.   Daughters and husband know her wishes- would desire CPR but not prolonged life support if futile     Review of Systems General:  No chills, fever, night sweats or weight changes.  Cardiovascular:  No chest pain, dyspnea on exertion, edema, orthopnea, palpitations, paroxysmal nocturnal dyspnea. Dermatological: No rash, lesions/masses Respiratory: No cough, dyspnea Urologic: No hematuria, dysuria Abdominal:   No nausea, vomiting, diarrhea, bright red blood per rectum, melena, or hematemesis Neurologic:  No  visual changes, wkns, changes in mental status. All other systems reviewed and are otherwise negative except as noted above.  Physical Exam  Blood pressure 114/88, pulse 85, temperature 97.9 F (36.6 C), temperature source Oral, resp. rate 16, SpO2 100 %.  General: Pleasant, NAD Psych: Normal affect. Neuro: Alert and oriented X 3. Moves all extremities spontaneously. HEENT: Normal  Neck: Supple without bruits or JVD. Lungs:  Resp regular and unlabored, CTA. Heart: RRR no s3, s4, or murmurs. Abdomen: Soft, non-tender, non-distended, BS + x 4.  Extremities: No clubbing, cyanosis or edema. DP/PT/Radials 2+ and equal bilaterally.  Labs   Recent Labs  01/01/16 1144  TROPONINI 0.81*   Lab Results  Component Value Date   WBC 8.8 01/01/2016   HGB 13.6 01/01/2016   HCT 40.0 01/01/2016   MCV 91.3 01/01/2016   PLT 251 01/01/2016    Recent Labs Lab 01/01/16 1144 01/01/16 1318  NA 135 137  K 3.8 3.8  CL 100* 101  CO2 24  --   BUN 16 14  CREATININE 0.99 0.80  CALCIUM 9.6  --   GLUCOSE 292* 266*   Lab Results  Component Value Date   CHOL 339* 09/20/2015   HDL 57.10 09/20/2015   LDLCALC 146* 03/12/2015   TRIG 224.0* 09/20/2015   No results found for: DDIMER Invalid input(s): POCBNP   Radiology/Studies  No results found.  Echocardiogram 09/16/2013 - Left ventricle: The cavity size was normal. Systolic function was normal. The estimated ejection fraction was in the range of 55% to 60%. Numerous images concerning for moderate inferior wall hypokinesis. Doppler parameters are consistent with abnormal left ventricular relaxation (grade 1 diastolic dysfunction). - Left atrium: The atrium was normal in size. - Right ventricle: Systolic function was normal. - Pulmonary arteries: Systolic pressure was within the normal range.  ECG: sinus rhythm with 1 mm ST elevation in  the inferolateral leads,that are new when compared to prior EKG.    ASSESSMENT AND  PLAN  STEMI CAD Hypertensive heart disease without CHF Hyperlipidemia Smoking  The patient was admitted with acute STEMI, in the settings of known CAD active chest pain relieved by nitroglycerin, and ST elevation in the inferolateral leads she'll be taken acutely to the Cath Lab by Dr. Irish Lack. STEMI pager was initiated. She is hemodynamically stable.her initial troponin is 0.8, follow-up here 1.67.   Signed, Ena Dawley, MD, [facility] 01/01/2016, 1:32 PM

## 2016-01-01 NOTE — Telephone Encounter (Signed)
Pt contacted office stating she has had 2 recent deaths in the family. Her nephew committed suicide last week, and her sister died this am. Pt is questioning if she "can have something for her nerves." Pt is currently on cymbalta. pls advise

## 2016-01-01 NOTE — ED Provider Notes (Signed)
[facility] Emergency Department Provider Note   ____________________________________________  Time seen: Approximately 11:55 AM  I have reviewed the triage vital signs and the nursing notes.   HISTORY  Chief Complaint Chest Pain    HPI Sarah Payne is a 70 y.o. female smoking, coronary artery disease, prior stent x3 placed to the mid left circumflex with moderate to severe LAD disease , stent placed to her distal RCA in February 2013, stent to the mid left circumflex in March 2013 , poorly controlled diabetes with hemoglobin A1c of 10, depression, hypertension, cardiac catheterization 08/19/2012 showing moderate mid LAD disease at the takeoff of the diagonal vessel, severe ostial to mid RCA disease, ejection fraction greater than 55% who presents for evaluation of several hours of chest pain today, gradual onset, constant, currently moderate. Patient reports that she developed this pain this morning after hearing that her sister had died. She reports that she is also having back pain which she had previously when she had a heart attack, she is not sure whether not the chest and back pain are related. Pain does radiate to the back at times.    Past Medical History  Diagnosis Date  . Diabetes mellitus   . Hypertension   . Hypothyroidism   . Depression   . Vascular dementia   . Tobacco abuse     a. 45 pack year history - quit 09/16/11  . Chronic low back pain   . Coronary artery disease     a. s/p PCI/DES LCX x 3;  b. NSTEMI 09/16/11;  c.   09/17/11 Cath [facility]) Severe RCA/LCX dzs - RCA treated, LCX intervention pending w/ Dr. Clayborn Bigness  . Sleep apnea     mild-does not use cpap  . Impingement syndrome of left shoulder   . Tendonitis of left rotator cuff   . Diverticulosis   . Myocardial infarction ([place])     x 5  . GERD (gastroesophageal reflux disease)   . Anginal pain ([place])   . Cataract     BILATERAL REMOVED  . Hyperlipidemia     Patient Active  Problem List   Diagnosis Date Noted  . Vaginitis and vulvovaginitis 12/03/2015  . Emesis 10/12/2015  . HTN (hypertension) 09/20/2015  . Nausea with vomiting 06/05/2015  . Benign neoplasm of colon 11/02/2014  . Family history of colon cancer 11/02/2014  . Internal hemorrhoids 11/02/2014  . Adjustment disorder with mixed anxiety and depressed mood 08/16/2014  . Esophageal reflux 05/23/2014  . Chronic anticoagulation 05/23/2014  . Diverticulosis of colon without hemorrhage 05/17/2014  . COPD, moderate ([place]) 03/29/2013  . Noncompliance with diabetes treatment 02/27/2013  . Obstructive sleep apnea 02/25/2013  . Chronic systolic congestive heart failure ([place]) 05/21/2012  . Lichen sclerosus et atrophicus 05/20/2012  . Bronchitis, chronic obstructive ([place]) 05/20/2012  . Angina at rest [facility]) 09/25/2011  . Diabetes mellitus type 2 with peripheral artery disease ([place]) 08/28/2011  . History of lumbar discectomy 06/29/2011  . Depression 06/24/2011  . Hypothyroidism 05/03/2011  . CAD, multiple vessel 02/24/2011  . Tobacco abuse 02/24/2011  . Hyperlipidemia 02/24/2011    Past Surgical History  Procedure Laterality Date  . Back surgery    . Appendectomy    . Abdominal hysterectomy    . Colonoscopy N/A 11/02/2014    Procedure: COLONOSCOPY;  Surgeon: Inda Castle, MD;  Location: [place];  Service: Endoscopy;  Laterality: N/A;  . Cataract extraction w/phaco Left 01/30/2015    Procedure: CATARACT EXTRACTION PHACO AND  INTRAOCULAR LENS PLACEMENT (IOC);  Surgeon: Birder Robson, MD;  Location: [facility] ORS;  Service: Ophthalmology;  Laterality: Left;  [place] 00:47   . Cardiac catheterization  2013  . Coronary angioplasty  2012    stent x 3   . Coronary angioplasty with stent placement  2013  . Cardiac catheterization  2016  . Cataract extraction w/phaco Right 02/13/2015    Procedure: CATARACT EXTRACTION PHACO AND INTRAOCULAR LENS PLACEMENT (IOC);  Surgeon: Birder Robson, MD;  Location: [facility]  ORS;  Service: Ophthalmology;  Laterality: Right;  cassette lot # [id number] H [place]  00:29.9 AP  20.7 CDE  6.20    Current Outpatient Rx  Name  Route  Sig  Dispense  Refill  . aspirin 81 MG tablet   Oral   Take 81 mg by mouth daily.           Marland Kitchen atorvastatin (LIPITOR) 80 MG tablet   Oral   Take 1 tablet (80 mg total) by mouth daily.   90 tablet   3   . Calcium & Magnesium Carbonates (MYLANTA PO)   Oral   Take by mouth.           . carvedilol (COREG) 3.125 MG tablet   Oral   Take 2 tablets (6.25 mg total) by mouth 2 (two) times daily.   180 tablet   3   . clopidogrel (PLAVIX) 75 MG tablet      TAKE 1 TABLET (75 MG TOTAL) BY MOUTH DAILY.   90 tablet   3   . DULoxetine (CYMBALTA) 60 MG capsule      TAKE 1 CAPSULE BY MOUTH DAILY.   90 capsule   3   . enalapril (VASOTEC) 5 MG tablet   Oral   Take 1 tablet (5 mg total) by mouth daily.   90 tablet   3   . ezetimibe (ZETIA) 10 MG tablet   Oral   Take 1 tablet (10 mg total) by mouth daily.   90 tablet   3   . fluconazole (DIFLUCAN) 150 MG tablet      1 tab by mouth every 3 days   3 tablet   0   . glipiZIDE (GLUCOTROL) 10 MG tablet      Take 5 mg Before breakfast and supper   180 tablet   1   . glucose blood (FREESTYLE LITE) test strip      Check blood sugar 3 times daily to check blood sugars - Uncontrolled diabetes mellitus   300 each   3   . glucose monitoring kit (FREESTYLE) monitoring kit   Does not apply   1 each by Does not apply route as needed for other. PHARMACY PLEASE SUBSTITUTE WHATEVER GLUCOMETER YOU CARRY   1 each   1   . insulin aspart (NOVOLOG FLEXPEN) 100 UNIT/ML FlexPen   Subcutaneous   Inject 5-9 Units into the skin 3 (three) times daily with meals.         . Insulin Glargine (LANTUS SOLOSTAR) 100 UNIT/ML Solostar Pen   Subcutaneous   Inject 30 Units into the skin as directed.         . isosorbide mononitrate (IMDUR) 30 MG 24 hr tablet   Oral   Take 1 tablet (30 mg total) by  mouth daily.   90 tablet   3   . lansoprazole (PREVACID) 15 MG capsule   Oral   Take by mouth. Reported on 11/06/2015         .  levothyroxine (SYNTHROID, LEVOTHROID) 175 MCG tablet      TAKE ONE TABLET BY MOUTH ONCE DAILY BEFORE BREAKFAST   30 tablet   0   . magnesium 30 MG tablet   Oral   Take by mouth. Reported on 09/20/2015         . metoCLOPramide (REGLAN) 10 MG tablet   Oral   Take 1 tablet (10 mg total) by mouth 4 (four) times daily.   90 tablet   0   . nitroGLYCERIN (NITROSTAT) 0.4 MG SL tablet   Sublingual   Place 1 tablet (0.4 mg total) under the tongue every 5 (five) minutes as needed.   25 tablet   2   . ondansetron (ZOFRAN) 4 MG tablet      Take 1 tab every 6 hours as needed for nausea.   30 tablet   2   . pantoprazole (PROTONIX) 40 MG tablet   Oral   Take 1 tablet (40 mg total) by mouth daily.   90 tablet   3   . sitaGLIPtin (JANUVIA) 100 MG tablet   Oral   Take 1 tablet (100 mg total) by mouth daily.   90 tablet   3   . traZODone (DESYREL) 100 MG tablet      TAKE 1 TABLET (100 MG TOTAL) BY MOUTH AT BEDTIME.   90 tablet   3     Allergies No known allergies  Family History  Problem Relation Age of Onset  . Heart attack Mother     First MI @ 25 - Died @ 65  . Heart disease Mother   . Heart disease Father     Died @ 74  . Throat cancer Brother   . Liver cancer Brother   . Colon cancer Sister     Social History Social History  Substance Use Topics  . Smoking status: Current Every Day Smoker -- 0.25 packs/day for 45 years    Types: Cigarettes  . Smokeless tobacco: Never Used     Comment: Has cut back, trying to quit.   . Alcohol Use: No    Review of Systems Constitutional: No fever/chills Eyes: No visual changes. ENT: No sore throat. Cardiovascular: + chest pain. Respiratory: Denies shortness of breath. Gastrointestinal: No abdominal pain.  No nausea, no vomiting.  No diarrhea.  No constipation. Genitourinary: Negative  for dysuria. Musculoskeletal: Positive for back pain. Skin: Negative for rash. Neurological: Negative for headaches, focal weakness or numbness.  10-point ROS otherwise negative.  ____________________________________________   PHYSICAL EXAM:  VITAL SIGNS: ED Triage Vitals  Enc Vitals Group     BP 01/01/16 1137 133/95 mmHg     Pulse Rate 01/01/16 1137 88     Resp 01/01/16 1137 18     Temp 01/01/16 1137 97.7 F (36.5 C)     Temp Source 01/01/16 1137 Oral     SpO2 01/01/16 1137 98 %     Weight 01/01/16 1137 140 lb (63.504 kg)     Height 01/01/16 1137 '5\' 6"'$  (1.676 m)     Head Cir --      Peak Flow --      Pain Score 01/01/16 1138 5     Pain Loc --      Pain Edu? --      Excl. in [place]? --     Constitutional: Alert and oriented. Generally well-appearing but intermittently tearful when discussing her sister. Eyes: Conjunctivae are normal. PERRL. EOMI. Head: Atraumatic. Nose: No congestion/rhinnorhea. Mouth/Throat: Mucous  membranes are moist.  Oropharynx non-erythematous. Neck: No stridor. Supple without meningismus. Cardiovascular: Normal rate, regular rhythm. Grossly normal heart sounds.  Good peripheral circulation. Respiratory: Normal respiratory effort.  No retractions. Lungs CTAB. Gastrointestinal: Soft and nontender. No distention.  No CVA tenderness. Genitourinary: deferred Musculoskeletal: No lower extremity tenderness nor edema.  No joint effusions. Neurologic:  Normal speech and language. 5 out of 5 strength bilateral upper and lower extremities, sensation intact to light touch throughout. Skin:  Skin is warm, dry and intact. No rash noted. Psychiatric: Mood and affect are normal. Speech and behavior are normal.  ____________________________________________   LABS (all labs ordered are listed, but only abnormal results are displayed)  Labs Reviewed  CBC  PROTIME-INR  APTT  BASIC METABOLIC PANEL  TROPONIN I  HEPARIN LEVEL (UNFRACTIONATED)    ____________________________________________  EKG  ED ECG REPORT I, Joanne Gavel, the attending physician, personally viewed and interpreted this ECG.   Date: 01/01/2016  EKG Time: 11:43  Rate: 92  Rhythm: normal sinus rhythm  Axis: normal  Intervals:none  ST&T Change: One millimeter of discordant ST elevation in V4, V5, no reciprocal changes, multiple PVCs.  ED ECG REPORT I, Joanne Gavel, the attending physician, personally viewed and interpreted this ECG.   Date: 01/01/2016  EKG Time: 11:51  Rate: 89  Rhythm: normal sinus rhythm  Axis: normal  Intervals:none  ST&T Change: 1 mm concordant ST elevation in V5, V6 without reciprocal change. Multiple PVCs   ____________________________________________  RADIOLOGY  None ____________________________________________   PROCEDURES  Procedure(s) performed: None  Critical Care performed: Yes. Total critical care time spent 40 minutes.  CRITICAL CARE Performed by: Loura Pardon A   Total critical care time: 40 minutes  Critical care time was exclusive of separately billable procedures and treating other patients.  Critical care was necessary to treat or prevent imminent or life-threatening deterioration.  Critical care was time spent personally by me on the following activities: development of treatment plan with patient and/or surrogate as well as nursing, discussions with consultants, evaluation of patient's response to treatment, examination of patient, obtaining history from patient or surrogate, ordering and performing treatments and interventions, ordering and review of laboratory studies, ordering and review of radiographic studies, pulse oximetry and re-evaluation of patient's condition.  ____________________________________________   INITIAL IMPRESSION / ASSESSMENT AND PLAN / ED COURSE  Pertinent labs & imaging results that were available during my care of the patient were reviewed by me and considered in  my medical decision making (see chart for details).  Sarah Payne is a 70 y.o. female smoking, coronary artery disease, prior stent x3 placed to the mid left circumflex with moderate to severe LAD disease , stent placed to her distal RCA in February 2013, stent to the mid left circumflex in March 2013 , poorly controlled diabetes with hemoglobin A1c of 10, depression, hypertension, cardiac catheterization 08/19/2012 showing moderate mid LAD disease at the takeoff of the diagonal vessel, severe ostial to mid RCA disease, ejection fraction greater than 55% who presents for evaluation of several hours of chest pain today. On exam, she is intermittently tearful but does not appear to be in any pain, not diaphoretic. Her vital signs are stable, she is afebrile. EKG is concerning for possible STEMI given 1 mm discordant millimeter ST elevation in V4, V5. Repeat EKG shows  1 mm concordant  ST elevation in V5 and V6, no reciprocal change. We'll obtain screening labs, discuss with [facility] cardiology as we have no STEMI  coverage today. She has good peripheral circulation and I think acute aortic dissection is much less likely.  ----------------------------------------- 12:06 PM on 01/01/2016 ----------------------------------------- Discussed with Dr. Pernell Dupre of [facility] cardiology who Has reviewed the EKGs which may represent  STEMI though not definite given only 1 mm of ST elevation, no reciprocal change. However, EKG certainly is changed from prior and the patient has multiple risk factors for ACS, her EKG is also changing/evolving since arrival to the emergency department. Dr. Tamala Julian recommends heparin load, aspirin, Plavix, nitroglycerin and transport to [facility] as we have no STEMI coverage here. She will be evaluated on arrival and the decision will be made at that time whether not to take her to the catheterization lab emergently. She reports her chest pain is down to a 1 out of 10 after sublingual nitroglycerin.    ____________________________________________   FINAL CLINICAL IMPRESSION(S) / ED DIAGNOSES  Final diagnoses:  Chest pain, unspecified chest pain type  ST elevation myocardial infarction (STEMI) of lateral wall (HCC)      NEW MEDICATIONS STARTED DURING THIS VISIT:  New Prescriptions   No medications on file     Note:  This document was prepared using Dragon voice recognition software and may include unintentional dictation errors.    Joanne Gavel, MD 01/01/16 1258

## 2016-01-01 NOTE — ED Notes (Signed)
Pt to ER as transfer from [facility]. Pt reports sudden onset chest pain after finding out sister passed away this morning. Radiates into back. Received 324 mg asprin and 1 nitro as well as plavix and ativan. Minimal chest pain on arrival. Cardiac hx. STEMI team at bedside.

## 2016-01-01 NOTE — Telephone Encounter (Signed)
I am so sorry for her loss.  We can call in an rx for a few xanax to help with severe panic attacks.  Please call in rx as entered below.

## 2016-01-01 NOTE — Progress Notes (Signed)
eLink Physician-Brief Progress Note Patient Name: Sarah Payne DOB: 04/07/1946 MRN: [id number]   Date of Service  01/01/2016  HPI/Events of Note  70 yo female with history of severe CAD, poorly controlled DM. Now with CP/STEMI after 2 recent deaths in the family over 2 weeks.  Currently pt awake, alert, vitals stable. Conversant and appears comfortable.   eICU Interventions  Continue management per cardiology.         Laverle Hobby 01/01/2016, 4:35 PM

## 2016-01-01 NOTE — Progress Notes (Signed)
ANTICOAGULATION CONSULT NOTE - Initial Consult  Pharmacy Consult for Heparin  Indication: chest pain/ACS  Allergies  Allergen Reactions  . No Known Allergies     Patient Measurements: Height: 5\' 6"  (167.6 cm) Weight: 140 lb (63.504 kg) IBW/kg (Calculated) : 59.3 Heparin Dosing Weight: 63.5 kg   Vital Signs: Temp: 97.7 F (36.5 C) (06/13 1202) Temp Source: Oral (06/13 1137) BP: 144/101 mmHg (06/13 1202) Pulse Rate: 97 (06/13 1202)  Labs: No results for input(s): HGB, HCT, PLT, APTT, LABPROT, INR, HEPARINUNFRC, HEPRLOWMOCWT, CREATININE, CKTOTAL, CKMB, TROPONINI in the last 72 hours.  CrCl cannot be calculated (Patient has no serum creatinine result on file.).   Medical History: Past Medical History  Diagnosis Date  . Diabetes mellitus   . Hypertension   . Hypothyroidism   . Depression   . Vascular dementia   . Tobacco abuse     a. 45 pack year history - quit 09/16/11  . Chronic low back pain   . Coronary artery disease     a. s/p PCI/DES LCX x 3;  b. NSTEMI 09/16/11;  c.   09/17/11 Cath [facility]) Severe RCA/LCX dzs - RCA treated, LCX intervention pending w/ Dr. Clayborn Bigness  . Sleep apnea     mild-does not use cpap  . Impingement syndrome of left shoulder   . Tendonitis of left rotator cuff   . Diverticulosis   . Myocardial infarction ([place])     x 5  . GERD (gastroesophageal reflux disease)   . Anginal pain ([place])   . Cataract     BILATERAL REMOVED  . Hyperlipidemia     Medications:   (Not in a hospital admission) Scheduled:   Infusions:  . heparin      Assessment: Pharmacy consulted to dose and monitor heparin therapy in this 69 year old female being treated for acute coronary syndrome. Patient has been having active chest pain with significant shortness of breath. Patient has a PMH significant for HTN, tobacco abuse, CAD, MI x 3, HLD, Type II DM, etc.    Goal of Therapy:  Heparin level 0.3-0.7 units/ml Monitor platelets by anticoagulation protocol: Yes    Plan:  Give 3800 units bolus x 1 Start heparin infusion at 750 units/hr Check anti-Xa level in 6 hours and daily while on heparin Continue to monitor H&H and platelets   Heparin level ordered to be drawn @ 1700  Nieve Rojero D 01/01/2016,12:38 PM

## 2016-01-01 NOTE — ED Notes (Signed)
C/O chronic cough.  Cough became painful to back and chest this morning after finding out that her sister had died.  Also c/o headache.

## 2016-01-01 NOTE — ED Provider Notes (Signed)
CSN: [id number]     Arrival date & time 01/01/16  1250 History   First MD Initiated Contact with Patient 01/01/16 1254     No chief complaint on file.    (Consider location/radiation/quality/duration/timing/severity/associated sxs/prior Treatment) HPI 70 year old female who presents with chest pain. History of CAD s/p stenting x 3, DM, HTN. Reports several hours of chest pain this morning after hearing news of the death of a family member. Presented to [facility] with new EKG changes. Initially Code STEMI, but upon further discussion with cardiology Dr. Tamala Julian, it was recommended that she be transferred to [facility] ED for re-evaluation. Received 1SL nitro, full dose aspirin, plavix and heparin bolus PTA. She on arrival reports minimal chest discomfort.   Past Medical History  Diagnosis Date  . Diabetes mellitus   . Hypertension   . Hypothyroidism   . Depression   . Vascular dementia   . Tobacco abuse     a. 45 pack year history - quit 09/16/11  . Chronic low back pain   . Coronary artery disease     a. s/p PCI/DES LCX x 3;  b. NSTEMI 09/16/11;  c.   09/17/11 Cath [facility]) Severe RCA/LCX dzs - RCA treated, LCX intervention pending w/ Dr. Clayborn Bigness  . Sleep apnea     mild-does not use cpap  . Impingement syndrome of left shoulder   . Tendonitis of left rotator cuff   . Diverticulosis   . Myocardial infarction ([place])     x 5  . GERD (gastroesophageal reflux disease)   . Anginal pain ([place])   . Cataract     BILATERAL REMOVED  . Hyperlipidemia    Past Surgical History  Procedure Laterality Date  . Back surgery    . Appendectomy    . Abdominal hysterectomy    . Colonoscopy N/A 11/02/2014    Procedure: COLONOSCOPY;  Surgeon: Inda Castle, MD;  Location: [place];  Service: Endoscopy;  Laterality: N/A;  . Cataract extraction w/phaco Left 01/30/2015    Procedure: CATARACT EXTRACTION PHACO AND INTRAOCULAR LENS PLACEMENT (IOC);  Surgeon: Birder Robson, MD;  Location: [facility]  ORS;  Service: Ophthalmology;  Laterality: Left;  [place] 00:47   . Cardiac catheterization  2013  . Coronary angioplasty  2012    stent x 3   . Coronary angioplasty with stent placement  2013  . Cardiac catheterization  2016  . Cataract extraction w/phaco Right 02/13/2015    Procedure: CATARACT EXTRACTION PHACO AND INTRAOCULAR LENS PLACEMENT (IOC);  Surgeon: Birder Robson, MD;  Location: [facility] ORS;  Service: Ophthalmology;  Laterality: Right;  cassette lot # [id number] H [place]  00:29.9 AP  20.7 CDE  6.20   Family History  Problem Relation Age of Onset  . Heart attack Mother     First MI @ 60 - Died @ 67  . Heart disease Mother   . Heart disease Father     Died @ 68  . Throat cancer Brother   . Liver cancer Brother   . Colon cancer Sister    Social History  Substance Use Topics  . Smoking status: Current Every Day Smoker -- 0.25 packs/day for 45 years    Types: Cigarettes  . Smokeless tobacco: Never Used     Comment: Has cut back, trying to quit.   . Alcohol Use: No   OB History    No data available     Review of Systems  Constitutional: Negative for diaphoresis.  Respiratory: Positive for shortness  of breath.   Cardiovascular: Positive for chest pain.  Gastrointestinal: Negative for nausea and vomiting.  All other systems reviewed and are negative.    Allergies  No known allergies  Home Medications   Prior to Admission medications   Medication Sig Start Date End Date Taking? Authorizing Provider  aspirin 81 MG tablet Take 81 mg by mouth daily.      Historical Provider, MD  atorvastatin (LIPITOR) 80 MG tablet Take 1 tablet (80 mg total) by mouth daily. 08/08/15   Minna Merritts, MD  Calcium & Magnesium Carbonates (MYLANTA PO) Take by mouth.      Historical Provider, MD  carvedilol (COREG) 3.125 MG tablet Take 2 tablets (6.25 mg total) by mouth 2 (two) times daily. 08/08/14   Minna Merritts, MD  clopidogrel (PLAVIX) 75 MG tablet TAKE 1 TABLET (75 MG TOTAL) BY MOUTH DAILY.  04/26/14   Lucille Passy, MD  DULoxetine (CYMBALTA) 60 MG capsule TAKE 1 CAPSULE BY MOUTH DAILY. 12/03/15   Lucille Passy, MD  enalapril (VASOTEC) 5 MG tablet Take 1 tablet (5 mg total) by mouth daily. 12/03/15   Lucille Passy, MD  ezetimibe (ZETIA) 10 MG tablet Take 1 tablet (10 mg total) by mouth daily. 08/08/15   Minna Merritts, MD  fluconazole (DIFLUCAN) 150 MG tablet 1 tab by mouth every 3 days 12/03/15   Lucille Passy, MD  glipiZIDE (GLUCOTROL) 10 MG tablet Take 5 mg Before breakfast and supper 12/11/15   Philemon Kingdom, MD  glucose blood (FREESTYLE LITE) test strip Check blood sugar 3 times daily to check blood sugars - Uncontrolled diabetes mellitus 05/18/13   Philemon Kingdom, MD  glucose monitoring kit (FREESTYLE) monitoring kit 1 each by Does not apply route as needed for other. PHARMACY PLEASE SUBSTITUTE WHATEVER GLUCOMETER YOU CARRY 06/02/12   Crecencio Mc, MD  insulin aspart (NOVOLOG FLEXPEN) 100 UNIT/ML FlexPen Inject 5-9 Units into the skin 3 (three) times daily with meals.    Historical Provider, MD  Insulin Glargine (LANTUS SOLOSTAR) 100 UNIT/ML Solostar Pen Inject 30 Units into the skin as directed.    Historical Provider, MD  isosorbide mononitrate (IMDUR) 30 MG 24 hr tablet Take 1 tablet (30 mg total) by mouth daily. 04/26/14   Lucille Passy, MD  lansoprazole (PREVACID) 15 MG capsule Take by mouth. Reported on 11/06/2015    Historical Provider, MD  levothyroxine (SYNTHROID, LEVOTHROID) 175 MCG tablet TAKE ONE TABLET BY MOUTH ONCE DAILY BEFORE BREAKFAST 11/28/15   Lucille Passy, MD  magnesium 30 MG tablet Take by mouth. Reported on 09/20/2015    Historical Provider, MD  metoCLOPramide (REGLAN) 10 MG tablet Take 1 tablet (10 mg total) by mouth 4 (four) times daily. 06/18/15   Lucille Passy, MD  nitroGLYCERIN (NITROSTAT) 0.4 MG SL tablet Place 1 tablet (0.4 mg total) under the tongue every 5 (five) minutes as needed. 05/08/14   Minna Merritts, MD  ondansetron (ZOFRAN) 4 MG tablet Take 1 tab  every 6 hours as needed for nausea. 10/24/15   Jessica D Zehr, PA-C  pantoprazole (PROTONIX) 40 MG tablet Take 1 tablet (40 mg total) by mouth daily. 10/12/15   Jessica D Zehr, PA-C  sitaGLIPtin (JANUVIA) 100 MG tablet Take 1 tablet (100 mg total) by mouth daily. 04/26/14   Lucille Passy, MD  traZODone (DESYREL) 100 MG tablet TAKE 1 TABLET (100 MG TOTAL) BY MOUTH AT BEDTIME. 12/03/15   Lucille Passy, MD   BP  114/88 mmHg  Pulse 85  Temp(Src) 97.9 F (36.6 C) (Oral)  Resp 16  SpO2 100% Physical Exam Physical Exam  Nursing note and vitals reviewed. Constitutional:  non-toxic, and in no acute distress Head: Normocephalic and atraumatic.  Mouth/Throat: Oropharynx is clear and moist.  Neck: Normal range of motion. Neck supple.  Cardiovascular: Normal rate and regular rhythm.   Pulmonary/Chest: Effort normal and breath sounds normal.  Abdominal: Soft. There is no tenderness. There is no rebound and no guarding.  Musculoskeletal: Normal range of motion.  Neurological: Alert, no facial droop, fluent speech, moves all extremities symmetrically Skin: Skin is warm and dry.  Psychiatric: Cooperative  ED Course  Procedures (including critical care time) Labs Review Labs Reviewed  CBC WITH DIFFERENTIAL/PLATELET  BASIC METABOLIC PANEL  PROTIME-INR  APTT  I-STAT CHEM 8, ED  I-STAT TROPOININ, ED    Imaging Review No results found. I have personally reviewed and evaluated these images and lab results as part of my medical decision-making.   EKG Interpretation None     CRITICAL CARE Performed by: Forde Dandy   Total critical care time: 30 minutes  Critical care time was exclusive of separately billable procedures and treating other patients.  Critical care was necessary to treat or prevent imminent or life-threatening deterioration.  Critical care was time spent personally by me on the following activities: development of treatment plan with patient and/or surrogate as well as nursing,  discussions with consultants, evaluation of patient's response to treatment, examination of patient, obtaining history from patient or surrogate, ordering and performing treatments and interventions, ordering and review of laboratory studies, ordering and review of radiographic studies, pulse oximetry and re-evaluation of patient's condition.  MDM   Final diagnoses:  Chest pain, unspecified chest pain type    Evaluated by Dr. Meda Coffee from cardiology on arrival. Repeat ekg c/f anterior ST elevation and flattening of t-waves laterally. Dr. Tamala Julian to accept for catheterization for potential STEMI.     Forde Dandy, MD 01/01/16 1310

## 2016-01-01 NOTE — Telephone Encounter (Signed)
Rx called in to requested pharmacy 

## 2016-01-01 NOTE — ED Notes (Signed)
Clothing belongings given to daughter Binnie Kand

## 2016-01-01 NOTE — Telephone Encounter (Signed)
Pt daughter in law calling stating pt is having CP   Pt c/o of Chest Pain: STAT if CP now or developed within 24 hours  1. Are you having CP right now? Yes    2. Are you experiencing any other symptoms (ex. SOB, nausea, vomiting, sweating)? Lot of SOB  3. How long have you been experiencing CP? A week now but getting worst.  4. Is your CP continuous or coming and going? consistent   5. Have you taken Nitroglycerin? She doesn't have any  ?

## 2016-01-01 NOTE — Telephone Encounter (Signed)
Spoke w/ pt's daughter, Cassandria Santee. She states that their family is going through a lot, 2 deaths in the past 2 weeks. Pt just received call that her sister passed away. Pt is having active chest pain, 5/10 pain, w/ significant SOB. Pt ran out of nitro, no longer has any on hand. Advised Tabatha to take pt to the ED for eval. She is agreeable, as pt's sx are worsening. She will proceed there now.

## 2016-01-02 ENCOUNTER — Telehealth: Payer: Self-pay | Admitting: Physician Assistant

## 2016-01-02 ENCOUNTER — Encounter (HOSPITAL_COMMUNITY): Payer: Self-pay

## 2016-01-02 ENCOUNTER — Inpatient Hospital Stay (HOSPITAL_COMMUNITY): Payer: Medicare Other

## 2016-01-02 ENCOUNTER — Other Ambulatory Visit: Payer: Self-pay | Admitting: Physician Assistant

## 2016-01-02 DIAGNOSIS — I429 Cardiomyopathy, unspecified: Secondary | ICD-10-CM

## 2016-01-02 DIAGNOSIS — I493 Ventricular premature depolarization: Secondary | ICD-10-CM

## 2016-01-02 DIAGNOSIS — I5181 Takotsubo syndrome: Principal | ICD-10-CM

## 2016-01-02 DIAGNOSIS — I425 Other restrictive cardiomyopathy: Secondary | ICD-10-CM

## 2016-01-02 DIAGNOSIS — I5021 Acute systolic (congestive) heart failure: Secondary | ICD-10-CM

## 2016-01-02 DIAGNOSIS — F430 Acute stress reaction: Secondary | ICD-10-CM

## 2016-01-02 LAB — COMPREHENSIVE METABOLIC PANEL
ALT: 13 U/L — ABNORMAL LOW (ref 14–54)
AST: 20 U/L (ref 15–41)
Albumin: 3.1 g/dL — ABNORMAL LOW (ref 3.5–5.0)
Alkaline Phosphatase: 70 U/L (ref 38–126)
Anion gap: 8 (ref 5–15)
BUN: 10 mg/dL (ref 6–20)
CO2: 24 mmol/L (ref 22–32)
Calcium: 9.1 mg/dL (ref 8.9–10.3)
Chloride: 103 mmol/L (ref 101–111)
Creatinine, Ser: 0.92 mg/dL (ref 0.44–1.00)
GFR calc Af Amer: >60 mL/min (ref >60)
GFR calc non Af Amer: >60 mL/min (ref >60)
Glucose, Bld: 280 mg/dL — ABNORMAL HIGH (ref 65–99)
Potassium: 3.8 mmol/L (ref 3.5–5.1)
Sodium: 135 mmol/L (ref 135–145)
Total Bilirubin: 0.7 mg/dL (ref 0.3–1.2)
Total Protein: 5.8 g/dL — ABNORMAL LOW (ref 6.5–8.1)

## 2016-01-02 LAB — ECHOCARDIOGRAM COMPLETE
E decel time: 215 msec
E/e' ratio: 10.85
FS: 25 % — AB (ref 28–44)
Height: 66 in
IVS/LV PW RATIO, ED: 2.31
LA ID, A-P, ES: 32 mm
LA diam end sys: 32 mm
LA diam index: 1.84 cm/m2
LA vol A4C: 16.1 ml
LA vol index: 14.6 mL/m2
LA vol: 25.4 mL
LV E/e' medial: 10.85
LV E/e'average: 10.85
LV PW d: 8.28 mm — AB (ref 0.6–1.1)
LV e' LATERAL: 6.2 cm/s
LVOT area: 3.46 cm2
LVOT diameter: 21 mm
MV Dec: 215
MV pk A vel: 82.6 m/s
MV pk E vel: 67.3 m/s
TAPSE: 11.5 mm
TDI e' lateral: 6.2
TDI e' medial: 4.03
Weight: 2275.15 oz

## 2016-01-02 LAB — GLUCOSE, CAPILLARY
Glucose-Capillary: 230 mg/dL — ABNORMAL HIGH (ref 65–99)
Glucose-Capillary: 181 mg/dL — ABNORMAL HIGH (ref 65–99)
Glucose-Capillary: 208 mg/dL — ABNORMAL HIGH (ref 65–99)

## 2016-01-02 MED ORDER — PERFLUTREN LIPID MICROSPHERE
INTRAVENOUS | Status: AC
  Filled 2016-01-02: qty 10

## 2016-01-02 MED ORDER — ALPRAZOLAM 0.25 MG PO TABS: 0.2500 mg | ORAL_TABLET | Freq: Two times a day (BID) | ORAL | Status: DC | PRN

## 2016-01-02 MED ORDER — LIVING BETTER WITH HEART FAILURE BOOK: Freq: Once | Status: DC

## 2016-01-02 MED ORDER — PERFLUTREN LIPID MICROSPHERE
1.0000 mL | INTRAVENOUS | Status: AC | PRN
  Administered 2016-01-02: 2 mL via INTRAVENOUS
  Filled 2016-01-02: qty 10

## 2016-01-02 MED FILL — Verapamil HCl IV Soln 2.5 MG/ML: INTRAVENOUS | Qty: 2 | Status: AC

## 2016-01-02 NOTE — Progress Notes (Signed)
Patient Name: Sarah Payne Date of Encounter: 01/02/2016  Active Problems:   Pain in the chest   Acute systolic heart failure (HCC)   Acute systolic (congestive) heart failure ([place])   Length of Stay: 1  SUBJECTIVE  She feels significantly better today, denies chest pain or SOB.   CURRENT MEDS . aspirin EC  81 mg Oral Daily  . atorvastatin  80 mg Oral Daily  . carvedilol  6.25 mg Oral BID  . clopidogrel  75 mg Oral Daily  . DULoxetine  60 mg Oral Daily  . enalapril  5 mg Oral Daily  . ezetimibe  10 mg Oral Daily  . feeding supplement  1 Container Oral TID BM  . glipiZIDE  5 mg Oral BID AC  . insulin aspart  9 Units Subcutaneous TID WC  . insulin glargine  30 Units Subcutaneous Q2200  . isosorbide mononitrate  30 mg Oral Daily  . levothyroxine  175 mcg Oral QAC breakfast  . linagliptin  5 mg Oral Daily  . metoCLOPramide  10 mg Oral QID  . pantoprazole  40 mg Oral Daily  . sodium chloride flush  3 mL Intravenous Q12H  . sodium chloride flush  3 mL Intravenous Q12H   OBJECTIVE  Filed Vitals:   01/02/16 0600 01/02/16 0700 01/02/16 0724 01/02/16 0800  BP: 113/68 96/69 96/69  106/75  Pulse: 72 71 68 73  Temp:   97.3 F (36.3 C)   TempSrc:   Oral   Resp: 13 19 14    Height:      Weight:      SpO2: 93% 93% 98% 98%    Intake/Output Summary (Last 24 hours) at 01/02/16 0854 Last data filed at 01/02/16 0800  Gross per 24 hour  Intake    880 ml  Output   1250 ml  Net   -370 ml   Filed Weights   01/01/16 1551  Weight: 142 lb 3.2 oz (64.5 kg)    PHYSICAL EXAM  General: Pleasant, NAD. Neuro: Alert and oriented X 3. Moves all extremities spontaneously. Psych: Normal affect. HEENT:  Normal  Neck: Supple without bruits or JVD. Lungs:  Resp regular and unlabored, CTA. Heart: RRR no s3, s4, or murmurs. Abdomen: Soft, non-tender, non-distended, BS + x 4.  Extremities: No clubbing, cyanosis or edema. DP/PT/Radials 2+ and equal bilaterally.  Accessory Clinical  Findings  CBC  Recent Labs  01/01/16 1144 01/01/16 1312 01/01/16 1318  WBC 7.5 8.8  --   NEUTROABS  --  6.0  --   HGB 13.7 12.9 13.6  HCT 40.5 38.8 40.0  MCV 91.9 91.3  --   PLT 248 251  --    Basic Metabolic Panel  Recent Labs  01/01/16 1144 01/01/16 1312 01/01/16 1318  NA 135 136 137  K 3.8 3.8 3.8  CL 100* 104 101  CO2 24 24  --   GLUCOSE 292* 261* 266*  BUN 16 13 14   CREATININE 0.99 0.80 0.80  CALCIUM 9.6 9.6  --     Recent Labs  01/01/16 1144  TROPONINI 0.81*   TELE:   ECG  ECHO: pending  LHC: 01/01/2016  Patent stents in the circumflex and RCA.  There is severe left ventricular systolic dysfunction in a pattern of Takotsubo cardiomyopathy. Medical therapy.    ASSESSMENT AND PLAN  70 year old female with prior CAD  1. STEMI - patent stents but severe left ventricular systolic dysfunction in a pattern of Takotsubo cardiomyopathy.  Her telemetry shows frequent PVCs in a pattern of trigeminy but no nsVTs, she denies palpitations, dizziness, no SOB. Ideally I would keep her another night in the hospital, however she is insisting on discharge because she wants to attend her sister's funeral. Given the circumstances we will discharge with precaations - rest at home for at least a week.  Follow up in our clinic on Monday 01/06/16 with repeat echocardiogram to reassess LVEF.  Home meds: ASA, carvedilol, enalapril, she likes to take nitrates, at the next visit if no symptoms, decrease imdur and increase carvedilol and ACEI. Advised not to take sl NTG as her BP is soft.  Given the circumstances of two tragic events in her family resulting in her condition we will give her Xanax 0.5 mg po x 20 tablets. No refills.   2. CAD - continue ASA, plavix atorvastatin, enalapril, carvedilol.   3. HLP - atorvastatin  4. HTN - rather soft BP   Signed, Ena Dawley MD, [facility] 01/02/2016

## 2016-01-02 NOTE — Progress Notes (Signed)
DC instructions given to pt at this time.  Verbalized understanding of all instructions.  CHF education given prophylactically.  No c/o pain.  No s/s of any acute distress.

## 2016-01-02 NOTE — Discharge Summary (Signed)
Discharge Summary    Patient ID: Sarah Payne,  MRN: [id number], DOB/AGE: 1946/01/25 70 y.o.  Admit date: 01/01/2016 Discharge date: 01/02/2016  Primary Care Provider: Arnette Norris Primary Cardiologist: Dr. Rockey Situ  Discharge Diagnoses    Principal Problem:   Takotsubo cardiomyopathy Active Problems:   CAD, multiple vessel   Tobacco abuse   Hyperlipidemia   HTN (hypertension)   Pain in the chest   Acute systolic (congestive) heart failure (HCC)   Frequent PVCs   Diabetes mellitus with circulatory complication (HCC)   Acute stress reaction    Diagnostic Studies/Procedures    1. Cath & echo this admission - see below. _____________   History of Present Illness & Hospital Course    Sarah Payne is a 70 y/o F with CAD s/p multiple PCIs, history of mild dementia, DM2 with diabetic nephropathy, neuropathy, tobacco abuse, h/o marijuana use, COPD, hypothyroidism, HTN, HLD who presented to [facility] as a possible STEMI. Per prior history she has history of multiple PCIs to the [facility], stent to [facility] 08/2011 in setting of NSTEMI, DES to prox-mid LCx for ISR 09/2011, DESx2 to prox-mRCA 07/2012. She has occasional history of chest pain as well as history of running out of her medications. Repeat cath 07/2014 without PCI after FFR of RCA. She was admitted yesterday with complaints of acute retrosternal chest tightness radiating to her back, reminiscent of prior angina. She has been under incredible stress, her nephew committed suicide a week ago and her sister passed away earlier that morning. EKG was suggestive of acute STEMI with 1 mm ST elevation in the inferolateral leads,that are new when compared to prior EKG. She underwent urgent LHC which showed patent stents in the Cx and RCA, with severe left ventricular systolic dysfunction in a pattern of Takotsubo cardiomyopathy. Medical therapy was recommended. 2D echo today showed severe focal basal hypertrophy of the septum, EF 25-30%, with akinesis of the  mid-apicalanteroseptal, lateral, inferior, inferoseptal, and apical myocardium; grade 1 DD. Telemetry showed PVCs in a pattern of trigeminy at times but no NSVT. Dr. Meda Coffee wished to keep the patient another day for recovery but the patient insisted on discharge because she wants to attend her sister's funeral. Dr. Meda Coffee felt that given the circumstances the patient could be discharged with precautions to rest, and return for any recurrent symptoms. She also authorized a short term prescription of Xanax to help with acute stress reaction to the patient's recent tragic events. She will follow-up in the office early next week with an echocardiogram. I spoke with the [facility] scheduling staff who will be working on accommodating this early follow-up. They will call her with her appointments. Discharge instructions are listed below.  Dr. Meda Coffee recommended that at follow-up appointment if she did not have any symptoms, to decrease Imdur and increase carvedilol and ACEI (she reports the patient likes to take nitrates). Dr. Meda Coffee advised her not to take sl NTG as her BP is soft. Regarding med list, I clarified that the patient does in fact take 2 PPIs, neither of which is currently known to affect clopidogrel. She was given education regarding observing for CHF sx, along with recommendation for salt/fluid restriction prior to DC.  Consultants: N/A _____________  Discharge Vitals Blood pressure 118/82, pulse 69, temperature 97.6 F (36.4 C), temperature source Oral, resp. rate 14, height 5' 6"  (1.676 m), weight 142 lb 3.2 oz (64.5 kg), SpO2 98 %.  Filed Weights   01/01/16 1551  Weight: 142 lb 3.2 oz (64.5 kg)  Labs & Radiologic Studies    CBC  Recent Labs  01/01/16 1144 01/01/16 1312 01/01/16 1318  WBC 7.5 8.8  --   NEUTROABS  --  6.0  --   HGB 13.7 12.9 13.6  HCT 40.5 38.8 40.0  MCV 91.9 91.3  --   PLT 248 251  --    Basic Metabolic Panel  Recent Labs  01/01/16 1312 01/01/16 1318  01/02/16 0935  NA 136 137 135  K 3.8 3.8 3.8  CL 104 101 103  CO2 24  --  24  GLUCOSE 261* 266* 280*  BUN 13 14 10   CREATININE 0.80 0.80 0.92  CALCIUM 9.6  --  9.1   Liver Function Tests  Recent Labs  01/02/16 0935  AST 20  ALT 13*  ALKPHOS 70  BILITOT 0.7  PROT 5.8*  ALBUMIN 3.1*   Cardiac Enzymes  Recent Labs  01/01/16 1144  TROPONINI 0.81*  _____________  No results found. Disposition   Pt is being discharged home today in good condition.  Follow-up Plans & Appointments    Follow-up Information    Follow up with [facility].   Specialty:  Cardiology   Why:  Office will call you for your followup appointment. Call office if you have not heard back by Friday morning.   Contact information:   [address] [place] [phone]     Discharge Instructions    Diet - low sodium heart healthy    Complete by:  As directed      Increase activity slowly    Complete by:  As directed   No driving for 1 week. Once you resume driving, do not drive while taking alprazolam until you know how this affects you, as it can make you sleepy or impaired. No lifting over 10 lbs for 2 weeks. No sexual activity for 2 weeks. You may not return to work until cleared by cardiologist, if applicable. Keep procedure site clean & dry. If you notice increased pain, swelling, bleeding or pus, call/return!  You may shower, but no soaking baths/hot tubs/pools for 1 week.  One of your heart tests showed weakness of the heart muscle this admission. This may make you more susceptible to weight gain from fluid retention, which can lead to symptoms that we call heart failure. Please follow these special instructions: 1. Follow a low-salt diet and watch your fluid intake. In general, you should not be taking in more than 2 liters of fluid per day (no more than 8 glasses per day). Some patients are restricted to less than 1.5 liters of fluid per  day (no more than 6 glasses per day). This includes sources of water in foods like soup, coffee, tea, milk, etc. 2. Weigh yourself on the same scale at same time of day and keep a log. 3. Call your doctor: (Anytime you feel any of the following symptoms)  - 3-4 pound weight gain in 1-2 days or 2 pounds overnight  - Shortness of breath, with or without a dry hacking cough  - Swelling in the hands, feet or stomach  - If you have to sleep on extra pillows at night in order to breathe   IT IS IMPORTANT TO LET YOUR DOCTOR KNOW EARLY ON IF YOU ARE HAVING SYMPTOMS SO WE CAN HELP YOU!           Discharge Medications   Current Discharge Medication List    START taking these medications  Details  ALPRAZolam (XANAX) 0.25 MG tablet Take 1 tablet (0.25 mg total) by mouth 2 (two) times daily as needed for anxiety. Qty: 20 tablet, Refills: 0      CONTINUE these medications which have NOT CHANGED   Details  aspirin 81 MG tablet Take 81 mg by mouth daily.      atorvastatin (LIPITOR) 80 MG tablet Take 1 tablet (80 mg total) by mouth daily.     Calcium & Magnesium Carbonates (MYLANTA PO) Take 15 mLs by mouth as needed (for indegestion).     carvedilol (COREG) 3.125 MG tablet Take 2 tablets (6.25 mg total) by mouth 2 (two) times daily.     clopidogrel (PLAVIX) 75 MG tablet TAKE 1 TABLET (75 MG TOTAL) BY MOUTH DAILY.     DULoxetine (CYMBALTA) 60 MG capsule TAKE 1 CAPSULE BY MOUTH DAILY.     enalapril (VASOTEC) 5 MG tablet Take 1 tablet (5 mg total) by mouth daily.     ezetimibe (ZETIA) 10 MG tablet Take 1 tablet (10 mg total) by mouth daily.     glipiZIDE (GLUCOTROL) 10 MG tablet Take 5 mg Before breakfast and supper    Associated Diagnoses: Diabetes mellitus type 2 with peripheral artery disease (HCC)    glucose blood (FREESTYLE LITE) test strip Check blood sugar 3 times daily to check blood sugars - Uncontrolled diabetes mellitus    Associated Diagnoses: Diabetes mellitus type 2  with peripheral artery disease (HCC)    glucose monitoring kit (FREESTYLE) monitoring kit 1 each by Does not apply route as needed for other. PHARMACY PLEASE SUBSTITUTE WHATEVER GLUCOMETER YOU CARRY     insulin aspart (NOVOLOG FLEXPEN) 100 UNIT/ML FlexPen Inject 12-14 Units into the skin 3 (three) times daily with meals. Normally takes 12 units    Insulin Glargine (LANTUS SOLOSTAR) 100 UNIT/ML Solostar Pen Inject 40 Units into the skin at bedtime.     isosorbide mononitrate (IMDUR) 30 MG 24 hr tablet Take 1 tablet (30 mg total) by mouth daily.     lansoprazole (PREVACID) 15 MG capsule Take 15 mg by mouth daily. Reported on 11/06/2015    levothyroxine (SYNTHROID, LEVOTHROID) 175 MCG tablet TAKE ONE TABLET BY MOUTH ONCE DAILY BEFORE BREAKFAST     magnesium 30 MG tablet Take 30 mg by mouth daily. Reported on 09/20/2015    metoCLOPramide (REGLAN) 10 MG tablet Take 1 tablet (10 mg total) by mouth 4 (four) times daily.     ondansetron (ZOFRAN) 4 MG tablet Take 1 tab every 6 hours as needed for nausea.     pantoprazole (PROTONIX) 40 MG tablet Take 1 tablet (40 mg total) by mouth daily.     sitaGLIPtin (JANUVIA) 100 MG tablet Take 1 tablet (100 mg total) by mouth daily.    Associated Diagnoses: Diabetes mellitus type 2 with peripheral artery disease (HCC)    traZODone (DESYREL) 100 MG tablet TAKE 1 TABLET (100 MG TOTAL) BY MOUTH AT BEDTIME.       STOP taking these medications     fluconazole (DIFLUCAN) 150 MG tablet - no longer taking      nitroGLYCERIN (NITROSTAT) 0.4 MG SL tablet - stopped due to hypotension          Allergies:  Allergies  Allergen Reactions  . No Known Allergies     Aspirin prescribed at discharge?  Yes High Intensity Statin Prescribed? (Lipitor 40-20m or Crestor 20-435m: Yes Beta Blocker Prescribed? Yes For EF <40%, was ACEI/ARB Prescribed? Yes ADP Receptor Inhibitor Prescribed? (i.e. Plavix  etc.-Includes Medically Managed Patients): Yes For EF  <40%, Aldosterone Inhibitor Prescribed? No: hypotension Was EF assessed during THIS hospitalization? Yes Was Cardiac Rehab II ordered? (Included Medically managed Patients): No: Takotsubo event rather than true MI.   Outstanding Labs/Studies   None  Duration of Discharge Encounter   Greater than 30 minutes including physician time.  Signed, Charlie Pitter PA-C 01/02/2016, 11:42 AM

## 2016-01-02 NOTE — Telephone Encounter (Signed)
Hi there - this is a mutual patient of our cardiology and yours. We are discharging her today after she was diagnosed with Takotsubo (stress-induced) cardiomyopathy which we suspect was related to the unfortunate back-to-back deaths in her family. Per the doctor I am working with, she will be discharged with a paper prescription for Xanax short-term.  I noticed a prescription for this was also sent in by your office yesterday as well - but it was sent to mail order, so it won't likely be available to the patient for 7-14 days. I just wanted to give you a heads up that the patient was given a prescription to fill locally immediately today by [place] in case you wanted to addend or cancel the one that would be arriving in 1-2 weeks. We gave her 0.25mg  BID PRN, #20.  Thanks. Cathalina Barcia PA-C

## 2016-01-02 NOTE — Telephone Encounter (Signed)
Thank you so much for letting me know and for taking such good care of her.

## 2016-01-02 NOTE — Progress Notes (Signed)
  Echocardiogram 2D Echocardiogram with Definity has been performed.  Darlina Sicilian M 01/02/2016, 8:58 AM

## 2016-01-02 NOTE — Care Management Important Message (Signed)
Important Message  Patient Details  Name: Sarah Payne MRN: [id number] Date of Birth: 1945/09/10   Medicare Important Message Given:  Yes    Loann Quill 01/02/2016, 8:07 AM

## 2016-01-03 ENCOUNTER — Telehealth: Payer: Self-pay | Admitting: Internal Medicine

## 2016-01-03 MED ORDER — LEVOTHYROXINE SODIUM 175 MCG PO TABS: ORAL_TABLET | ORAL | Status: DC

## 2016-01-03 NOTE — Telephone Encounter (Signed)
Please call in refill for thyroid med to [facility] she has 4 pills left

## 2016-01-03 NOTE — Telephone Encounter (Signed)
rx submitted.  

## 2016-01-07 ENCOUNTER — Ambulatory Visit (INDEPENDENT_AMBULATORY_CARE_PROVIDER_SITE_OTHER): Payer: Medicare Other | Admitting: Physician Assistant

## 2016-01-07 ENCOUNTER — Encounter: Payer: Self-pay | Admitting: Physician Assistant

## 2016-01-07 VITALS — BP 120/80 | HR 79 | Ht 66.0 in | Wt 140.4 lb

## 2016-01-07 DIAGNOSIS — E785 Hyperlipidemia, unspecified: Secondary | ICD-10-CM

## 2016-01-07 DIAGNOSIS — I5022 Chronic systolic (congestive) heart failure: Secondary | ICD-10-CM | POA: Diagnosis not present

## 2016-01-07 DIAGNOSIS — I2510 Atherosclerotic heart disease of native coronary artery without angina pectoris: Secondary | ICD-10-CM | POA: Diagnosis not present

## 2016-01-07 DIAGNOSIS — I10 Essential (primary) hypertension: Secondary | ICD-10-CM

## 2016-01-07 DIAGNOSIS — I5181 Takotsubo syndrome: Secondary | ICD-10-CM | POA: Diagnosis not present

## 2016-01-07 DIAGNOSIS — Z720 Tobacco use: Secondary | ICD-10-CM

## 2016-01-07 DIAGNOSIS — I208 Other forms of angina pectoris: Secondary | ICD-10-CM

## 2016-01-07 MED ORDER — CARVEDILOL 12.5 MG PO TABS: 12.5000 mg | ORAL_TABLET | Freq: Two times a day (BID) | ORAL | Status: DC

## 2016-01-07 MED ORDER — ENALAPRIL MALEATE 10 MG PO TABS: 10.0000 mg | ORAL_TABLET | Freq: Every day | ORAL | Status: DC

## 2016-01-07 NOTE — Patient Instructions (Signed)
Medication Instructions:  Your physician has recommended you make the following change in your medication:  INCREASE coreg to 12.5mg  twice daily INCREASE enalapril to 10mg  once daily STOP taking imdur   Labwork: none  Testing/Procedures: Keep June 27 echo appointment  Follow-Up: Your physician recommends that you schedule a follow-up appointment in: 3 weeks with Christell Faith, PA-C   Any Other Special Instructions Will Be Listed Below (If Applicable).     If you need a refill on your cardiac medications before your next appointment, please call your pharmacy.

## 2016-01-07 NOTE — Progress Notes (Signed)
Cardiology Office Note Date:  01/07/2016  Patient ID:  Sarah, Payne 02-17-1946, MRN [id number] PCP:  Arnette Norris, MD  Cardiologist:  Dr. Rockey Situ, MD    Chief Complaint: Hospital follow up  History of Present Illness: Sarah Payne is a 70 y.o. female with history of CAD s/p multiple PCIs, history of mild dementia, DM2 with diabetic neurpathy, tobacco abuse, history of marijuana abuse, COPD, hypothyroidism, HTN, and HLD who presents for hospital follow up of Takotsubo cardiomyopathy at [facility] from 6/13-6/14/17.   Per prior history she has history of multiple PCIs to the [facility], stent to [facility] 08/2011 in setting of NSTEMI, DES to prox-mid LCx for ISR 09/2011, DESx2 to prox-mRCA 07/2012. She has occasional history of chest pain as well as history of running out of her medications. Repeat cath 07/2014 without PCI after FFR of RCA. She presented as a possible STEMI on 6/13 with acute onset of retrosternal chest pain. She had been under increased stress as of late with her nephew committing suicide the week prior to her admission and her sister passed away that morning as well. EKG was suggestive of acute STEMI with 1 mm ST elevation in the inferolateral leads,that are new when compared to prior EKG. She underwent urgent LHC which showed patent stents in the Cx and RCA, with severe left ventricular systolic dysfunction in a pattern of Takotsubo cardiomyopathy. Medical therapy was recommended. 2D echo on 01/02/16 showed severe focal basal hypertrophy of the septum, EF 25-30%, with akinesis of the mid-apicalanteroseptal, lateral, inferior, inferoseptal, and apical myocardium; grade 1 DD. Telemetry showed PVCs in a pattern of trigeminy at times but no NSVT. Cardiology want to keep the patient for another day for recovery, however the patient insisted on discharge to be able to go to her sister's funeral.   Since her admission, she has done well with the help of her daughter. She has been eating a  low-sodium diet and limiting her PO fluid consumption. She has had a weight stable at 140 pounds daily with daily weights. She denies any LE edema. She has chronic 2-pillow orthopnea for years. She is tolerating all of her medications without an issue. She does not have a BP cuff yet. She has cut back on her tobacco abuse to 0.25 ppd, previously at 1 ppd. She has not had any further chest pain. She continues to be tearful at times given her above loses.   decrease Imdur  Increase Coreg/ACEi  check echo  breathing  weight  fluids  Past Medical History  Diagnosis Date  . Diabetes mellitus   . Hypertension   . Hypothyroidism   . Depression   . Vascular dementia   . Tobacco abuse   . Chronic low back pain   . Coronary artery disease     a. multiple PCIs to the mLCx. b. stent to dRCA 08/2011 in setting of NSTEMI. c. DES to prox-mid LCx for ISR 09/2011. d.  DESx2 to prox-mRCA 07/2012. e. Takotsubo event 12/2015 with patent stents.  . Sleep apnea     mild-does not use cpap  . Impingement syndrome of left shoulder   . Tendonitis of left rotator cuff   . Diverticulosis   . Myocardial infarction ([place])     x 5  . GERD (gastroesophageal reflux disease)   . Cataract     BILATERAL REMOVED  . Hyperlipidemia   . Marijuana abuse   . COPD (chronic obstructive pulmonary disease) ([place])   . Takotsubo  cardiomyopathy     a. 12/2015 - nephew committed suicide 1 week prior, sister died the morning of presentation - initially called a STEMI; cath with patent stents. LVEF 25-30%.  . Frequent PVCs     a. Noted in hospital 12/2015.    Past Surgical History  Procedure Laterality Date  . Back surgery    . Appendectomy    . Abdominal hysterectomy    . Colonoscopy N/A 11/02/2014    Procedure: COLONOSCOPY;  Surgeon: Inda Castle, MD;  Location: [place];  Service: Endoscopy;  Laterality: N/A;  . Cataract extraction w/phaco Left 01/30/2015    Procedure: CATARACT EXTRACTION PHACO AND INTRAOCULAR LENS  PLACEMENT (IOC);  Surgeon: Birder Robson, MD;  Location: [facility];  Service: Ophthalmology;  Laterality: Left;  [place] 00:47   . Cardiac catheterization  2013  . Coronary angioplasty  2012    stent x 3   . Coronary angioplasty with stent placement  2013  . Cardiac catheterization  2016  . Cataract extraction w/phaco Right 02/13/2015    Procedure: CATARACT EXTRACTION PHACO AND INTRAOCULAR LENS PLACEMENT (IOC);  Surgeon: Birder Robson, MD;  Location: [facility];  Service: Ophthalmology;  Laterality: Right;  cassette lot # [id number] H [place]  00:29.9 AP  20.7 CDE  6.20  . Cardiac catheterization N/A 01/01/2016    Procedure: Left Heart Cath and Coronary Angiography;  Surgeon: Jettie Booze, MD;  Location: [facility];  Service: Cardiovascular;  Laterality: N/A;    Current Outpatient Prescriptions  Medication Sig Dispense Refill  . ALPRAZolam (XANAX) 0.25 MG tablet Take 1 tablet (0.25 mg total) by mouth 2 (two) times daily as needed for anxiety. 20 tablet 0  . aspirin 81 MG tablet Take 81 mg by mouth daily.      Marland Kitchen atorvastatin (LIPITOR) 80 MG tablet Take 1 tablet (80 mg total) by mouth daily. 90 tablet 3  . Calcium & Magnesium Carbonates (MYLANTA PO) Take 15 mLs by mouth as needed (for indegestion).     . carvedilol (COREG) 3.125 MG tablet Take 2 tablets (6.25 mg total) by mouth 2 (two) times daily. 180 tablet 3  . clopidogrel (PLAVIX) 75 MG tablet TAKE 1 TABLET (75 MG TOTAL) BY MOUTH DAILY. 90 tablet 3  . DULoxetine (CYMBALTA) 60 MG capsule TAKE 1 CAPSULE BY MOUTH DAILY. 90 capsule 3  . enalapril (VASOTEC) 5 MG tablet Take 1 tablet (5 mg total) by mouth daily. 90 tablet 3  . ezetimibe (ZETIA) 10 MG tablet Take 1 tablet (10 mg total) by mouth daily. 90 tablet 3  . glipiZIDE (GLUCOTROL) 10 MG tablet Take 5 mg Before breakfast and supper 180 tablet 1  . glucose blood (FREESTYLE LITE) test strip Check blood sugar 3 times daily to check blood sugars - Uncontrolled diabetes mellitus 300 each 3  .  glucose monitoring kit (FREESTYLE) monitoring kit 1 each by Does not apply route as needed for other. PHARMACY PLEASE SUBSTITUTE WHATEVER GLUCOMETER YOU CARRY 1 each 1  . insulin aspart (NOVOLOG FLEXPEN) 100 UNIT/ML FlexPen Inject 12-14 Units into the skin 3 (three) times daily with meals. Normally takes 12 units    . Insulin Glargine (LANTUS SOLOSTAR) 100 UNIT/ML Solostar Pen Inject 40 Units into the skin at bedtime.     . isosorbide mononitrate (IMDUR) 30 MG 24 hr tablet Take 1 tablet (30 mg total) by mouth daily. 90 tablet 3  . lansoprazole (PREVACID) 15 MG capsule Take 15 mg by mouth daily. Reported on 11/06/2015    .  levothyroxine (SYNTHROID, LEVOTHROID) 175 MCG tablet TAKE ONE TABLET BY MOUTH ONCE DAILY BEFORE BREAKFAST 30 tablet 2  . magnesium 30 MG tablet Take 30 mg by mouth daily. Reported on 09/20/2015    . metoCLOPramide (REGLAN) 10 MG tablet Take 1 tablet (10 mg total) by mouth 4 (four) times daily. 90 tablet 0  . ondansetron (ZOFRAN) 4 MG tablet Take 1 tab every 6 hours as needed for nausea. 30 tablet 2  . pantoprazole (PROTONIX) 40 MG tablet Take 1 tablet (40 mg total) by mouth daily. 90 tablet 3  . sitaGLIPtin (JANUVIA) 100 MG tablet Take 1 tablet (100 mg total) by mouth daily. 90 tablet 3  . traZODone (DESYREL) 100 MG tablet TAKE 1 TABLET (100 MG TOTAL) BY MOUTH AT BEDTIME. (Patient taking differently: Take 200 mg by mouth at bedtime. ) 90 tablet 3   No current facility-administered medications for this visit.    Allergies:   No known allergies   Social History:  The patient  reports that she has been smoking Cigarettes.  She has a 45 pack-year smoking history. She has never used smokeless tobacco. She reports that she uses illicit drugs (Marijuana). She reports that she does not drink alcohol.   Family History:  The patient's family history includes Colon cancer in her sister; Heart attack in her mother; Heart disease in her father and mother; Liver cancer in her brother; Throat  cancer in her brother.  ROS:   Review of Systems  Constitutional: Positive for malaise/fatigue. Negative for fever, chills, weight loss and diaphoresis.  HENT: Negative for congestion.   Eyes: Negative for discharge and redness.  Respiratory: Positive for cough. Negative for hemoptysis, sputum production, shortness of breath and wheezing.   Cardiovascular: Positive for orthopnea. Negative for chest pain, palpitations, claudication, leg swelling and PND.       Stable 2-pillow orthopnea for "years"  Gastrointestinal: Negative for heartburn, nausea, vomiting and abdominal pain.  Musculoskeletal: Negative for myalgias and falls.  Skin: Negative for rash.  Neurological: Negative for dizziness, tingling, tremors, sensory change, speech change, focal weakness, loss of consciousness and weakness.  Endo/Heme/Allergies: Does not bruise/bleed easily.  Psychiatric/Behavioral: Positive for substance abuse. The patient is not nervous/anxious.        Ongoing tobacco abuse, currently at 0.25 ppd, previously 1 ppd  All other systems reviewed and are negative.    PHYSICAL EXAM:  VS:  BP 120/80 mmHg  Pulse 79  Ht 5' 6"  (1.676 m)  Wt 140 lb 6.4 oz (63.685 kg)  BMI 22.67 kg/m2 BMI: Body mass index is 22.67 kg/(m^2). Well nourished, well developed, in no acute distress HEENT: normocephalic, atraumatic Neck: no JVD, carotid bruits or masses Cardiac: normal S1, S2; RRR; no murmurs, rubs, or gallops Lungs: clear to auscultation bilaterally, no wheezing, rhonchi or rales Abd: soft, nontender, no hepatomegaly, + BS MS: no deformity or atrophy Ext: no edema Skin: warm and dry, no rash Neuro:  moves all extremities spontaneously, no focal abnormalities noted, follows commands Psych: euthymic mood, full affect   EKG:  Was ordered and interpreted by me today. Shows NER, 79 bpm, nonspecific anterolateral st/t changes  Recent Labs: 09/20/2015: TSH 51.35* 01/01/2016: Hemoglobin 13.6; Platelets 251 01/02/2016:  ALT 13*; BUN 10; Creatinine, Ser 0.92; Potassium 3.8; Sodium 135  03/12/2015: LDL Cholesterol 146* 09/20/2015: Cholesterol 339*; Direct LDL 237.0; HDL 57.10; Total CHOL/HDL Ratio 6; Triglycerides 224.0*; VLDL 44.8*   Estimated Creatinine Clearance: 55.5 mL/min (by C-G formula based on Cr of 0.92).  Wt Readings from Last 3 Encounters:  01/07/16 140 lb 6.4 oz (63.685 kg)  01/01/16 142 lb 3.2 oz (64.5 kg)  01/01/16 140 lb (63.504 kg)     Other studies reviewed: Additional studies/records reviewed today include: summarized above  ASSESSMENT AND PLAN:  1. Chronic systolic CHF/Takotsubo cardiomyopathy: Doing well. She does not appear to be volume overloaded at this time. She is scheduled for repeat echo 01/15/16 at 2 PM. Increase Coreg to 12.5 mg bid and enalapril to 10 mg daily. CHF education provided, including diet, fluids, and warning signs. Get BP cuff.   2. CAD as above: Cardiac cath as above without evidence of occlusive CAD. Continue aspirin 81 mg and Plavix 75 mg daily. Increase Coreg as above. Increase enalapril as above. No further angina. Stop Imdur to allow for titration of her HF medications.   3. HLD: Lipitor 80 mg daily.   4. HTN: Well controlled today at 120/80. Increase Coreg to 12.5 mg bid and enalapril to 10 mg daily as above. Healthy diet as above.   5. Stress reaction: On Xanax s/p hospital discharge given her above loses. Has only needed two tabs (one thte day of her sister's funeral and a 2nd today pre-office visit). Further refill will need to come from her PCP.  6. Care: Her daughter is now her primary care taker and will need to be off work at times for her mother's health and office visits. She will drop FMLA paperwork by our office for [place] to complete.   7. Tobacco abuse: She is working on cessation on her own at this time.   Disposition: F/u with me 1-2 weeks after echo on 01/15/16  Current medicines are reviewed at length with the patient today.  The patient did  not have any concerns regarding medicines.  Melvern Banker PA-C 01/07/2016 2:25 PM     [address], [place] [address] [phone]

## 2016-01-08 ENCOUNTER — Telehealth: Payer: Self-pay

## 2016-01-08 ENCOUNTER — Telehealth: Payer: Self-pay | Admitting: Internal Medicine

## 2016-01-08 DIAGNOSIS — E1151 Type 2 diabetes mellitus with diabetic peripheral angiopathy without gangrene: Secondary | ICD-10-CM

## 2016-01-08 MED ORDER — GLIPIZIDE 10 MG PO TABS: ORAL_TABLET | ORAL | Status: DC

## 2016-01-08 MED ORDER — INSULIN GLARGINE 100 UNIT/ML SOLOSTAR PEN: 40.0000 [IU] | PEN_INJECTOR | Freq: Every day | SUBCUTANEOUS | Status: DC

## 2016-01-08 MED ORDER — INSULIN PEN NEEDLE 32G X 4 MM MISC: Status: DC

## 2016-01-08 MED ORDER — INSULIN ASPART 100 UNIT/ML FLEXPEN: 12.0000 [IU] | PEN_INJECTOR | Freq: Three times a day (TID) | SUBCUTANEOUS | Status: DC

## 2016-01-08 MED ORDER — LEVOTHYROXINE SODIUM 175 MCG PO TABS: ORAL_TABLET | ORAL | Status: DC

## 2016-01-08 NOTE — Telephone Encounter (Signed)
PT called and needs her Glipizide and Levothyroxine called into [facility] on [address] in [place]. PT also needs Novolog, Lantus, and Pen Needles sent into the [facility]

## 2016-01-08 NOTE — Telephone Encounter (Signed)
Pt called for refills on protonix, zofran and zetia; per pts med list pt should have available refills and pt will ck with pharmacies.pt will have pharmacy cb if needed.

## 2016-01-08 NOTE — Telephone Encounter (Signed)
Rx's submitted per pt's request listed below.

## 2016-01-09 ENCOUNTER — Emergency Department
Admission: EM | Admit: 2016-01-09 | Discharge: 2016-01-09 | Disposition: A | Discharge status: Home / Self Care | Payer: Medicare Other | Attending: Emergency Medicine | Admitting: Emergency Medicine

## 2016-01-09 ENCOUNTER — Telehealth: Payer: Self-pay | Admitting: Cardiovascular Disease

## 2016-01-09 ENCOUNTER — Emergency Department: Payer: Medicare Other

## 2016-01-09 DIAGNOSIS — G8918 Other acute postprocedural pain: Secondary | ICD-10-CM | POA: Insufficient documentation

## 2016-01-09 DIAGNOSIS — R1030 Lower abdominal pain, unspecified: Secondary | ICD-10-CM | POA: Insufficient documentation

## 2016-01-09 DIAGNOSIS — I252 Old myocardial infarction: Secondary | ICD-10-CM | POA: Diagnosis not present

## 2016-01-09 DIAGNOSIS — I2510 Atherosclerotic heart disease of native coronary artery without angina pectoris: Secondary | ICD-10-CM | POA: Insufficient documentation

## 2016-01-09 DIAGNOSIS — I5022 Chronic systolic (congestive) heart failure: Secondary | ICD-10-CM | POA: Insufficient documentation

## 2016-01-09 DIAGNOSIS — Z794 Long term (current) use of insulin: Secondary | ICD-10-CM | POA: Diagnosis not present

## 2016-01-09 DIAGNOSIS — E785 Hyperlipidemia, unspecified: Secondary | ICD-10-CM | POA: Insufficient documentation

## 2016-01-09 DIAGNOSIS — E039 Hypothyroidism, unspecified: Secondary | ICD-10-CM | POA: Insufficient documentation

## 2016-01-09 DIAGNOSIS — Z79899 Other long term (current) drug therapy: Secondary | ICD-10-CM | POA: Diagnosis not present

## 2016-01-09 DIAGNOSIS — Z7982 Long term (current) use of aspirin: Secondary | ICD-10-CM | POA: Diagnosis not present

## 2016-01-09 DIAGNOSIS — E119 Type 2 diabetes mellitus without complications: Secondary | ICD-10-CM | POA: Insufficient documentation

## 2016-01-09 DIAGNOSIS — S301XXA Contusion of abdominal wall, initial encounter: Secondary | ICD-10-CM | POA: Diagnosis not present

## 2016-01-09 DIAGNOSIS — R1031 Right lower quadrant pain: Secondary | ICD-10-CM

## 2016-01-09 DIAGNOSIS — F329 Major depressive disorder, single episode, unspecified: Secondary | ICD-10-CM | POA: Insufficient documentation

## 2016-01-09 DIAGNOSIS — I110 Hypertensive heart disease with heart failure: Secondary | ICD-10-CM | POA: Diagnosis not present

## 2016-01-09 DIAGNOSIS — F17210 Nicotine dependence, cigarettes, uncomplicated: Secondary | ICD-10-CM | POA: Diagnosis not present

## 2016-01-09 DIAGNOSIS — J449 Chronic obstructive pulmonary disease, unspecified: Secondary | ICD-10-CM | POA: Insufficient documentation

## 2016-01-09 DIAGNOSIS — R52 Pain, unspecified: Secondary | ICD-10-CM

## 2016-01-09 DIAGNOSIS — Z9889 Other specified postprocedural states: Secondary | ICD-10-CM

## 2016-01-09 DIAGNOSIS — T814XXA Infection following a procedure, initial encounter: Secondary | ICD-10-CM | POA: Diagnosis not present

## 2016-01-09 LAB — CBC WITH DIFFERENTIAL/PLATELET
Basophils Absolute: 0.1 10*3/uL (ref 0–0.1)
Basophils Relative: 1 %
Eosinophils Absolute: 0.1 10*3/uL (ref 0–0.7)
Eosinophils Relative: 1 %
HCT: 40.6 % (ref 35.0–47.0)
Hemoglobin: 13.9 g/dL (ref 12.0–16.0)
Lymphocytes Relative: 20 %
Lymphs Abs: 2.2 10*3/uL (ref 1.0–3.6)
MCH: 31.1 pg (ref 26.0–34.0)
MCHC: 34.2 g/dL (ref 32.0–36.0)
MCV: 91 fL (ref 80.0–100.0)
Monocytes Absolute: 0.7 10*3/uL (ref 0.2–0.9)
Monocytes Relative: 6 %
Neutro Abs: 8.2 10*3/uL — ABNORMAL HIGH (ref 1.4–6.5)
Neutrophils Relative %: 72 %
Platelets: 266 10*3/uL (ref 150–440)
RBC: 4.47 MIL/uL (ref 3.80–5.20)
RDW: 13.1 % (ref 11.5–14.5)
WBC: 11.3 10*3/uL — ABNORMAL HIGH (ref 3.6–11.0)

## 2016-01-09 LAB — COMPREHENSIVE METABOLIC PANEL
ALT: 16 U/L (ref 14–54)
AST: 18 U/L (ref 15–41)
Albumin: 4.2 g/dL (ref 3.5–5.0)
Alkaline Phosphatase: 81 U/L (ref 38–126)
Anion gap: 10 (ref 5–15)
BUN: 16 mg/dL (ref 6–20)
CO2: 25 mmol/L (ref 22–32)
Calcium: 9.8 mg/dL (ref 8.9–10.3)
Chloride: 101 mmol/L (ref 101–111)
Creatinine, Ser: 0.92 mg/dL (ref 0.44–1.00)
GFR calc Af Amer: >60 mL/min (ref >60)
GFR calc non Af Amer: >60 mL/min (ref >60)
Glucose, Bld: 242 mg/dL — ABNORMAL HIGH (ref 65–99)
Potassium: 4.1 mmol/L (ref 3.5–5.1)
Sodium: 136 mmol/L (ref 135–145)
Total Bilirubin: 0.4 mg/dL (ref 0.3–1.2)
Total Protein: 7.5 g/dL (ref 6.5–8.1)

## 2016-01-09 LAB — TROPONIN I: Troponin I: <0.03 ng/mL (ref <0.031)

## 2016-01-09 LAB — PROTIME-INR
INR: 1.01
Prothrombin Time: 13.5 seconds (ref 11.4–15.0)

## 2016-01-09 MED ORDER — ONDANSETRON HCL 4 MG/2ML IJ SOLN
4.0000 mg | Freq: Once | INTRAMUSCULAR | Status: AC
  Administered 2016-01-09: 4 mg via INTRAVENOUS
  Filled 2016-01-09: qty 2

## 2016-01-09 MED ORDER — MORPHINE SULFATE (PF) 4 MG/ML IV SOLN
4.0000 mg | Freq: Once | INTRAVENOUS | Status: AC
  Administered 2016-01-09: 4 mg via INTRAVENOUS
  Filled 2016-01-09: qty 1

## 2016-01-09 MED ORDER — TRAMADOL HCL 50 MG PO TABS: 50.0000 mg | ORAL_TABLET | Freq: Four times a day (QID) | ORAL | Status: AC | PRN

## 2016-01-09 NOTE — Discharge Instructions (Signed)
Please seek medical attention for any high fevers, chest pain, shortness of breath, change in behavior, persistent vomiting, bloody stool or any other new or concerning symptoms.  Hematoma A hematoma is a collection of blood. The collection of blood can turn into a hard, painful lump under the skin. Your skin may turn blue or yellow if the hematoma is close to the surface of the skin. Most hematomas get better in a few days to weeks. Some hematomas are serious and need medical care. Hematomas can be very small or very big. HOME CARE  Apply ice to the injured area:  Put ice in a plastic bag.  Place a towel between your skin and the bag.  Leave the ice on for 20 minutes, 2-3 times a day for the first 1 to 2 days.  After the first 2 days, switch to using warm packs on the injured area.  Raise (elevate) the injured area to lessen pain and puffiness (swelling). You may also wrap the area with an elastic bandage. Make sure the bandage is not wrapped too tight.  If you have a painful hematoma on your leg or foot, you may use crutches for a couple days.  Only take medicines as told by your doctor. GET HELP RIGHT AWAY IF:   Your pain gets worse.  Your pain is not controlled with medicine.  You have a fever.  Your puffiness gets worse.  Your skin turns more blue or yellow.  Your skin over the hematoma breaks or starts bleeding.  Your hematoma is in your chest or belly (abdomen) and you are short of breath, feel weak, or have a change in consciousness.  Your hematoma is on your scalp and you have a headache that gets worse or a change in alertness or consciousness. MAKE SURE YOU:   Understand these instructions.  Will watch your condition.  Will get help right away if you are not doing well or get worse.   This information is not intended to replace advice given to you by your health care provider. Make sure you discuss any questions you have with your health care provider.     Document Released: 08/14/2004 Document Revised: 03/09/2013 Document Reviewed: 12/15/2012 Elsevier Interactive Patient Education [employer].

## 2016-01-09 NOTE — ED Notes (Signed)

## 2016-01-09 NOTE — ED Notes (Signed)
Patient transported to Ultrasound via stretcher 

## 2016-01-09 NOTE — ED Provider Notes (Signed)
-----------------------------------------   4:56 PM on 01/09/2016 -----------------------------------------  IMPRESSION: 1. Right groin subcutaneous 7.9 cm hematoma without pseudoaneurysm or extravasation. 2. Suggestion of possible small AV fistula. If symptoms persist, CTA pelvis with contrast may be useful for further characterization.  On exam at this time I did not feel a thrill, nor did I hear a bruit. Patient was tender over the right groin with ecchymosis present. Will discuss with vascular surgery  ----------------------------------------- 6:26 PM on 01/09/2016 -----------------------------------------  Discussed with Dr. Delana Meyer who reviewed the imaging. Given normal venous waveform thinks unlikely AV fistula or possibly a small one. Is willing to follow up with patient in clinic. Patient has appointment with cardiologist tomorrow. Will plan on discharge home with pain medication. Think pain likely from hematoma.  Nance Pear, MD 01/09/16 [phone]

## 2016-01-09 NOTE — Telephone Encounter (Signed)
Spoke with patient and she reports that she has a lump to her right groin that is bigger in size, site is warm to touch, having pain in her abdomen, hip, and down right leg. Pain is 8 to 9 on scale of 1-10. She reports trouble sitting down and also with changes in position. Based on her reports I instructed her that she should go to emergency room for further evaluation. She verbalized understanding and reports that she should be there in 30 minutes. Called ED and spoke with Raliegh Ip RN in [facility] ED to let her know that patient would be coming in for evaluation.

## 2016-01-09 NOTE — ED Provider Notes (Signed)
 [facility] Emergency Department Provider Note   ____________________________________________  Time seen: Approximately 2:27 PM  I have reviewed the triage vital signs and the nursing notes.   HISTORY  Chief Complaint Post-op Problem    HPI Sarah Payne is a 70 y.o. female who is complaining of severe pain in her right groin that started in the past 24 hours. She also has a knot in that area. Patient had a cardiac catheter done about one week ago at [facility] and was drawn fine up to this point. Patient denies any numbness tingling or weakness to her extremities. Patient also denies any fever or chills or difficulty urinating or having a bowel movement. She called her cardiologist who told her to come and have the leg evaluated for a clot or an aneurysm.   Past Medical History  Diagnosis Date  . Diabetes mellitus   . Hypertension   . Hypothyroidism   . Depression   . Vascular dementia   . Tobacco abuse   . Chronic low back pain   . Coronary artery disease     a. multiple PCIs to the mLCx. b. stent to dRCA 08/2011 in setting of NSTEMI. c. DES to prox-mid LCx for ISR 09/2011. d.  DESx2 to prox-mRCA 07/2012. e. Takotsubo event 12/2015 with patent stents.  . Sleep apnea     mild-does not use cpap  . Impingement syndrome of left shoulder   . Tendonitis of left rotator cuff   . Diverticulosis   . Myocardial infarction (HCC)     x 5  . GERD (gastroesophageal reflux disease)   . Cataract     BILATERAL REMOVED  . Hyperlipidemia   . Marijuana abuse   . COPD (chronic obstructive pulmonary disease) (HCC)   . Takotsubo cardiomyopathy     a. 12/2015 - nephew committed suicide 1 week prior, sister died the morning of presentation - initially called a STEMI; cath with patent stents. LVEF 25-30%.  . Frequent PVCs     a. Noted in hospital 12/2015.    Patient Active Problem List   Diagnosis Date Noted  . Takotsubo cardiomyopathy 01/02/2016  .  Frequent PVCs 01/02/2016  . Diabetes mellitus with circulatory complication (HCC) 01/02/2016  . Acute stress reaction 01/02/2016  . Acute systolic (congestive) heart failure (HCC) 01/01/2016  . Pain in the chest   . Vaginitis and vulvovaginitis 12/03/2015  . Emesis 10/12/2015  . HTN (hypertension) 09/20/2015  . Benign neoplasm of colon 11/02/2014  . Family history of colon cancer 11/02/2014  . Internal hemorrhoids 11/02/2014  . Adjustment disorder with mixed anxiety and depressed mood 08/16/2014  . Esophageal reflux 05/23/2014  . Diverticulosis of colon without hemorrhage 05/17/2014  . COPD, moderate (HCC) 03/29/2013  . Noncompliance with diabetes treatment 02/27/2013  . Obstructive sleep apnea 02/25/2013  . Chronic systolic congestive heart failure (HCC) 05/21/2012  . Lichen sclerosus et atrophicus 05/20/2012  . Bronchitis, chronic obstructive (HCC) 05/20/2012  . Diabetes mellitus type 2 with peripheral artery disease (HCC) 08/28/2011  . History of lumbar discectomy 06/29/2011  . Depression 06/24/2011  . Hypothyroidism 05/03/2011  . CAD, multiple vessel 02/24/2011  . Tobacco abuse 02/24/2011  . Hyperlipidemia 02/24/2011    Past Surgical History  Procedure Laterality Date  . Back surgery    . Appendectomy    . Abdominal hysterectomy    . Colonoscopy N/A 11/02/2014    Procedure: COLONOSCOPY;  Surgeon: Robert D Kaplan, MD;  Location: [facility] ENDOSCOPY;  Service: Endoscopy;    Laterality: N/A;  . Cataract extraction w/phaco Left 01/30/2015    Procedure: CATARACT EXTRACTION PHACO AND INTRAOCULAR LENS PLACEMENT (IOC);  Surgeon: William Porfilio, MD;  Location: [facility] ORS;  Service: Ophthalmology;  Laterality: Left;  US 00:47   . Cardiac catheterization  2013  . Coronary angioplasty  2012    stent x 3   . Coronary angioplasty with stent placement  2013  . Cardiac catheterization  2016  . Cataract extraction w/phaco Right 02/13/2015    Procedure: CATARACT EXTRACTION PHACO AND INTRAOCULAR  LENS PLACEMENT (IOC);  Surgeon: William Porfilio, MD;  Location: [facility] ORS;  Service: Ophthalmology;  Laterality: Right;  cassette lot # [id number]  00:29.9 AP  20.7 CDE  6.20  . Cardiac catheterization N/A 01/01/2016    Procedure: Left Heart Cath and Coronary Angiography;  Surgeon: Jayadeep S Varanasi, MD;  Location: [facility] INVASIVE CV LAB;  Service: Cardiovascular;  Laterality: N/A;    Current Outpatient Rx  Name  Route  Sig  Dispense  Refill  . ALPRAZolam (XANAX) 0.25 MG tablet   Oral   Take 1 tablet (0.25 mg total) by mouth 2 (two) times daily as needed for anxiety.   20 tablet   0   . aspirin 81 MG tablet   Oral   Take 81 mg by mouth daily.           . atorvastatin (LIPITOR) 80 MG tablet   Oral   Take 1 tablet (80 mg total) by mouth daily.   90 tablet   3   . Calcium & Magnesium Carbonates (MYLANTA PO)   Oral   Take 15 mLs by mouth as needed (for indegestion).          . carvedilol (COREG) 12.5 MG tablet   Oral   Take 1 tablet (12.5 mg total) by mouth 2 (two) times daily.   60 tablet   3   . clopidogrel (PLAVIX) 75 MG tablet      TAKE 1 TABLET (75 MG TOTAL) BY MOUTH DAILY.   90 tablet   3   . DULoxetine (CYMBALTA) 60 MG capsule      TAKE 1 CAPSULE BY MOUTH DAILY.   90 capsule   3   . enalapril (VASOTEC) 10 MG tablet   Oral   Take 1 tablet (10 mg total) by mouth daily.   30 tablet   3   . ezetimibe (ZETIA) 10 MG tablet   Oral   Take 1 tablet (10 mg total) by mouth daily.   90 tablet   3   . glipiZIDE (GLUCOTROL) 10 MG tablet      Take 5 mg Before breakfast and supper   180 tablet   1   . glucose blood (FREESTYLE LITE) test strip      Check blood sugar 3 times daily to check blood sugars - Uncontrolled diabetes mellitus   300 each   3   . glucose monitoring kit (FREESTYLE) monitoring kit   Does not apply   1 each by Does not apply route as needed for other. PHARMACY PLEASE SUBSTITUTE WHATEVER GLUCOMETER YOU CARRY   1 each   1   . insulin  aspart (NOVOLOG FLEXPEN) 100 UNIT/ML FlexPen   Subcutaneous   Inject 12-14 Units into the skin 3 (three) times daily with meals. Normally takes 12 units   15 mL   3   . Insulin Glargine (LANTUS SOLOSTAR) 100 UNIT/ML Solostar Pen   Subcutaneous   Inject 40 Units into   the skin at bedtime.   15 mL   3   . Insulin Pen Needle (INSUPEN PEN NEEDLES) 32G X 4 MM MISC      Use to inject insulin 4 times daily.   250 each   1   . lansoprazole (PREVACID) 15 MG capsule   Oral   Take 15 mg by mouth daily. Reported on 11/06/2015         . levothyroxine (SYNTHROID, LEVOTHROID) 175 MCG tablet      TAKE ONE TABLET BY MOUTH ONCE DAILY BEFORE BREAKFAST   30 tablet   2   . magnesium 30 MG tablet   Oral   Take 30 mg by mouth daily. Reported on 09/20/2015         . metoCLOPramide (REGLAN) 10 MG tablet   Oral   Take 1 tablet (10 mg total) by mouth 4 (four) times daily.   90 tablet   0   . ondansetron (ZOFRAN) 4 MG tablet      Take 1 tab every 6 hours as needed for nausea.   30 tablet   2   . pantoprazole (PROTONIX) 40 MG tablet   Oral   Take 1 tablet (40 mg total) by mouth daily.   90 tablet   3   . sitaGLIPtin (JANUVIA) 100 MG tablet   Oral   Take 1 tablet (100 mg total) by mouth daily.   90 tablet   3   . traZODone (DESYREL) 100 MG tablet      TAKE 1 TABLET (100 MG TOTAL) BY MOUTH AT BEDTIME. Patient taking differently: Take 200 mg by mouth at bedtime.    90 tablet   3     Allergies No known allergies  Family History  Problem Relation Age of Onset  . Heart attack Mother     First MI @ 16 - Died @ 61  . Heart disease Mother   . Heart disease Father     Died @ 63  . Throat cancer Brother   . Liver cancer Brother   . Colon cancer Sister     Social History Social History  Substance Use Topics  . Smoking status: Current Every Day Smoker -- 1.00 packs/day for 45 years    Types: Cigarettes  . Smokeless tobacco: Never Used     Comment: Has cut back, trying to  quit.   . Alcohol Use: No    Review of Systems Constitutional: No fever/chills Eyes: No visual changes. ENT: No sore throat. Cardiovascular: Denies chest pain. Respiratory: Denies shortness of breath. Gastrointestinal: No abdominal pain.  No nausea, no vomiting.  No diarrhea.  No constipation. Genitourinary: Negative for dysuria. Musculoskeletal: Negative for back pain.Patient was severe pain in her right groin and a knot in her right groin. Skin: Negative for rash. Neurological: Negative for headaches, focal weakness or numbness.  10-point ROS otherwise negative.  ____________________________________________   PHYSICAL EXAM:  VITAL SIGNS: ED Triage Vitals  Enc Vitals Group     BP 01/09/16 1346 82/56 mmHg     Pulse Rate 01/09/16 1346 55     Resp 01/09/16 1346 18     Temp 01/09/16 1346 97.8 F (36.6 C)     Temp Source 01/09/16 1346 Oral     SpO2 01/09/16 1346 96 %     Weight 01/09/16 1346 140 lb (63.504 kg)     Height 01/09/16 1346 5' 6" (1.676 m)     Head Cir --  Peak Flow --      Pain Score 01/09/16 1402 10     Pain Loc --      Pain Edu? --      Excl. in GC? --     Constitutional: Alert and oriented. Well appearing and in Moderate acute distress. Eyes: Conjunctivae are normal. PERRL. EOMI. Head: Atraumatic. Nose: No congestion/rhinnorhea. Mouth/Throat: Mucous membranes are moist.  Oropharynx non-erythematous. Neck: No stridor.   Cardiovascular: Normal rate, regular rhythm. Grossly normal heart sounds.  Good peripheral circulation. Respiratory: Normal respiratory effort.  No retractions. Lungs CTAB. Gastrointestinal: Soft and nontender. No distention. No abdominal bruits. No CVA tenderness. Musculoskeletal: Patient with tenderness to her right groin and obvious hematoma to the lower abdomen and upper right groin area. She still has good pulses throughout. She has limited range of motion at that hip secondary to pain. no edema.  No joint effusions. Neurologic:   Normal speech and language. No gross focal neurologic deficits are appreciated. No gait instability. Skin:  Skin is warm, dry and intact. No rash noted. Psychiatric: Mood and affect are normal. Speech and behavior are normal.  ____________________________________________   LABS (all labs ordered are listed, but only abnormal results are displayed)  Labs Reviewed  CBC WITH DIFFERENTIAL/PLATELET - Abnormal; Notable for the following:    WBC 11.3 (*)    Neutro Abs 8.2 (*)    All other components within normal limits  COMPREHENSIVE METABOLIC PANEL - Abnormal; Notable for the following:    Glucose, Bld 242 (*)    All other components within normal limits  TROPONIN I  PROTIME-INR   ____________________________________________  EKG   ____________________________________________  RADIOLOGY   ____________________________________________   PROCEDURES  Procedure(s) performed: None  Critical Care performed: No ____________________________________________   INITIAL IMPRESSION / ASSESSMENT AND PLAN / ED COURSE  Pertinent labs & imaging results that were available during my care of the patient were reviewed by me and considered in my medical decision making (see chart for details). 3:00 PM Patient will be given some pain and nausea medicine and she will get Doppler of her right lower extremity limited to rule out hematoma or pseudoaneurysm status post a cardiac catheter. Patient will be signed out to Dr. Goodman at 4 PM. ____________________________________________   FINAL CLINICAL IMPRESSION(S) / ED DIAGNOSES  Final diagnoses:  Pain  Right groin pain  Recent surgical procedure on lower extremity      NEW MEDICATIONS STARTED DURING THIS VISIT:  New Prescriptions   No medications on file     Note:  This document was prepared using Dragon voice recognition software and may include unintentional dictation errors.    Josepha M , MD 01/09/16 1500 

## 2016-01-09 NOTE — Telephone Encounter (Signed)
-   LMOV  With patient son Marylyn Ishihara  regarding release of information packet.  - Mailed release packet to patient.  -Faxed over paperwork to [facility]

## 2016-01-09 NOTE — Telephone Encounter (Signed)
No answer. No voicemail. 

## 2016-01-09 NOTE — ED Notes (Signed)
Pt presents today with c/o pain to her R groin. Pt states approx 8 days ago had a cardiac cath, states this morning woke up with pain to the site, pt c/o bruising at this time, no obvious bruising noted on observation by this RN. Pt states she thinks she is having bleeding from her artery, no obvious swelling noted at this time.

## 2016-01-09 NOTE — ED Notes (Signed)
Report received from [place], [place]. This RN assumed patient care.

## 2016-01-09 NOTE — Telephone Encounter (Signed)
Patient having pain s/p R Cath on Tuesday 6/13 C/o Abdomen hip and R Leg pain 8-9 on pain scale Can't lay on R side struggling to stand and sit on toilet  Site  Has lump bigger now that post procedure and feels warm/hot like fever to touch  Patient denies redness but states the site is real bruised.  Please call to discuss.

## 2016-01-10 ENCOUNTER — Encounter: Payer: Self-pay | Admitting: Cardiovascular Disease

## 2016-01-10 ENCOUNTER — Ambulatory Visit (INDEPENDENT_AMBULATORY_CARE_PROVIDER_SITE_OTHER): Payer: Medicare Other | Admitting: Cardiovascular Disease

## 2016-01-10 VITALS — BP 98/65 | HR 58 | Ht 66.0 in | Wt 140.2 lb

## 2016-01-10 DIAGNOSIS — I5022 Chronic systolic (congestive) heart failure: Secondary | ICD-10-CM

## 2016-01-10 DIAGNOSIS — I5181 Takotsubo syndrome: Secondary | ICD-10-CM

## 2016-01-10 DIAGNOSIS — E785 Hyperlipidemia, unspecified: Secondary | ICD-10-CM | POA: Diagnosis not present

## 2016-01-10 DIAGNOSIS — E1151 Type 2 diabetes mellitus with diabetic peripheral angiopathy without gangrene: Secondary | ICD-10-CM

## 2016-01-10 DIAGNOSIS — I2510 Atherosclerotic heart disease of native coronary artery without angina pectoris: Secondary | ICD-10-CM | POA: Diagnosis not present

## 2016-01-10 NOTE — Patient Instructions (Signed)
Medication Instructions:  Please check pressure in the AM daily  Cut the enalapril in 1/2 daily Am  If pressures continue to run low, Cut the isosorbide in 1/2 daily in the evening  For groin Take it easy,  If you get more swelling and pain, Lay down, apply pressure, call me We would do a CT scan  Echocardiogram in 3 months   Follow-Up: It was a pleasure seeing you in the office today. Please call us if you have new issues that need to be addressed before your next appt.  [phone]  Your physician wants you to follow-up in: 3 months after echocardiogram You will receive a reminder letter in the mail two months in advance. If you don't receive a letter, please call our office to schedule the follow-up appointment.  If you need a refill on your cardiac medications before your next appointment, please call your pharmacy.

## 2016-01-10 NOTE — Addendum Note (Signed)
Addended by: Dede Query R on: 01/10/2016 11:05 AM   Modules accepted: Orders

## 2016-01-10 NOTE — Progress Notes (Signed)
Patient ID: Sarah Payne, female   DOB: 04/21/1946, 70 y.o.   MRN: [id number] Cardiology Office Note  Date:  01/10/2016   ID:  Sarah Payne, Sarah Payne 08-15-1945, MRN [id number]  PCP:  Arnette Norris, MD   Chief Complaint  Patient presents with  . other    Groin pain and low BP. Meds reviewed verbally with pt.    HPI:  70 year old woman with a long history of smoking, coronary artery disease, prior stent x3 placed to the mid left circumflex with moderate to severe LAD disease , stent placed to her distal RCA in February 2013, stent to the mid left circumflex in March 2013 , poorly controlled diabetes with hemoglobin A1c of 10, depression, hypertension, cardiac catheterization 08/19/2012 showing moderate mid LAD disease at the takeoff of the diagonal vessel, severe ostial to mid RCA disease, ejection fraction greater than 55%. She had FFR pressure wire showing severe disease of the RCA with drug-eluting stent x2 placed. C. 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 as ejection fraction was depressed below her baseline ejection fraction which was previously normal in 2015  She reports that she is doing well for at least a week following the procedure with no groin issues Presenting to the ER yesterday for groin pain, acute onset  ultrasound performed in the emergency room Ultrasound confirmed 7.9 cm x 2.8 x 1 cm hematoma Unable to exclude small AV fistula,  Pain is somewhat better today, able to ambulate Denies excessive activity  Blood pressure  running low, enalapril was increased while she was in the hospital after catheterization  Echocardiogram 01/02/2016 showing ejection fraction 25-30%, felt secondary to stress cardiomyopathy  Continues to have problems with her diabetes, hemoglobin A1c of 10 compliant on Lipitor 40 mg daily.   Other past medical history in the hospital January 12, discharged 08/02/2014. Diagnosed with unstable angina. Had cardiac catheterization showing severe stable coronary disease, Echocardiogram with normal ejection fraction Total cholesterol 292  Since her discharge, she denies any further chest pain. She reports that she is taking her medications. Recently been to diabetes instruction classes, scheduled to start insulin. She has been compliant with her statin per the patient She has developed a cough since her discharge from the hospital. She does not want antibiotics given prior history of C. Difficile at the end of 2015  Details of the echocardiogram 08/01/2014; ejection fraction 55-60%, normal right ventricular systolic pressure, normal right heart systolic function Catheterization details, unchanged from prior catheterization. Lab report has not been signed off in the computer. Catheterization by Dr. Ellyn Hack. Medical management was recommended  Previous Lab work showing hemoglobin A1c 8.8, total cholesterol 287, LDL 174  Echocardiogram February 07, 2011 shows normal LV systolic function, diastolic dysfunction, moderate inferior wall hypokinesis, normal right ventricular systolic pressures.  PMH:   has a past medical history of Diabetes mellitus; Hypertension; Hypothyroidism; Depression; Vascular dementia; Tobacco abuse; Chronic low back pain; Coronary artery disease; Sleep apnea; Impingement syndrome of left shoulder; Tendonitis of left rotator cuff; Diverticulosis; Myocardial infarction ([place]); GERD (gastroesophageal reflux disease); Cataract; Hyperlipidemia; Marijuana abuse; COPD (chronic obstructive  pulmonary disease) ([place]); Takotsubo cardiomyopathy;  and Frequent PVCs.  PSH:    Past Surgical History  Procedure Laterality Date  . Back surgery    . Appendectomy    . Abdominal hysterectomy    . Colonoscopy N/A 11/02/2014    Procedure: COLONOSCOPY;  Surgeon: Inda Castle, MD;  Location: [facility];  Service: Endoscopy;  Laterality: N/A;  . Cataract extraction w/phaco Left 01/30/2015    Procedure: CATARACT EXTRACTION PHACO AND INTRAOCULAR LENS PLACEMENT (IOC);  Surgeon: Birder Robson, MD;  Location: [facility];  Service: Ophthalmology;  Laterality: Left;  [place] 00:47   . Cardiac catheterization  2013  . Coronary angioplasty  2012    stent x 3   . Coronary angioplasty with stent placement  2013  . Cardiac catheterization  2016  . Cataract extraction w/phaco Right 02/13/2015    Procedure: CATARACT EXTRACTION PHACO AND INTRAOCULAR LENS PLACEMENT (IOC);  Surgeon: Birder Robson, MD;  Location: [facility];  Service: Ophthalmology;  Laterality: Right;  cassette lot # [id number]  00:29.9 AP  20.7 CDE  6.20  . Cardiac catheterization N/A 01/01/2016    Procedure: Left Heart Cath and Coronary Angiography;  Surgeon: Jettie Booze, MD;  Location: [facility];  Service: Cardiovascular;  Laterality: N/A;    Current Outpatient Prescriptions  Medication Sig Dispense Refill  . ALPRAZolam (XANAX) 0.25 MG tablet Take 1 tablet (0.25 mg total) by mouth 2 (two) times daily as needed for anxiety. 20 tablet 0  . aspirin 81 MG tablet Take 81 mg by mouth daily.      Marland Kitchen atorvastatin (LIPITOR) 80 MG tablet Take 1 tablet (80 mg total) by mouth daily. 90 tablet 3  . Calcium & Magnesium Carbonates (MYLANTA PO) Take 15 mLs by mouth as needed (for indegestion).     . carvedilol (COREG) 3.125 MG tablet Take 6.25 mg by mouth 2 (two) times daily with a meal.    . clopidogrel (PLAVIX) 75 MG tablet TAKE 1 TABLET (75 MG TOTAL) BY MOUTH DAILY. 90 tablet 3  . DULoxetine (CYMBALTA) 60 MG capsule TAKE 1 CAPSULE BY  MOUTH DAILY. 90 capsule 3  . enalapril (VASOTEC) 10 MG tablet Take 1 tablet (10 mg total) by mouth daily. 30 tablet 3  . ezetimibe (ZETIA) 10 MG tablet Take 1 tablet (10 mg total) by mouth daily. 90 tablet 3  . glipiZIDE (GLUCOTROL) 10 MG tablet Take 5 mg Before breakfast and supper 180 tablet 1  . glucose blood (FREESTYLE LITE) test strip Check blood sugar 3 times daily to check blood sugars - Uncontrolled diabetes mellitus 300 each 3  . glucose monitoring kit (FREESTYLE) monitoring kit 1 each by Does not apply route as needed for other. PHARMACY PLEASE SUBSTITUTE WHATEVER GLUCOMETER YOU CARRY 1 each 1  . insulin aspart (NOVOLOG FLEXPEN) 100 UNIT/ML FlexPen Inject 12-14 Units into the skin 3 (three) times daily with meals. Normally takes 12 units 15 mL 3  . Insulin Glargine (LANTUS SOLOSTAR) 100 UNIT/ML Solostar Pen Inject 40 Units into the skin at bedtime. 15 mL 3  . Insulin Pen Needle (INSUPEN PEN NEEDLES) 32G X 4 MM MISC Use to inject insulin 4 times daily. 250 each 1  . isosorbide mononitrate (IMDUR) 30 MG 24 hr tablet Take 30 mg by mouth daily.    . lansoprazole (PREVACID) 15 MG capsule Take 15 mg by mouth daily. Reported on 11/06/2015    . levothyroxine (SYNTHROID, LEVOTHROID) 175 MCG tablet TAKE ONE TABLET BY MOUTH ONCE DAILY BEFORE BREAKFAST 30 tablet 2  .  magnesium 30 MG tablet Take 30 mg by mouth daily. Reported on 09/20/2015    . ondansetron (ZOFRAN) 4 MG tablet Take 1 tab every 6 hours as needed for nausea. 30 tablet 2  . pantoprazole (PROTONIX) 40 MG tablet Take 1 tablet (40 mg total) by mouth daily. 90 tablet 3  . sitaGLIPtin (JANUVIA) 100 MG tablet Take 1 tablet (100 mg total) by mouth daily. 90 tablet 3  . traMADol (ULTRAM) 50 MG tablet Take 1 tablet (50 mg total) by mouth every 6 (six) hours as needed. 12 tablet 0  . traZODone (DESYREL) 100 MG tablet TAKE 1 TABLET (100 MG TOTAL) BY MOUTH AT BEDTIME. (Patient taking differently: Take 200 mg by mouth at bedtime. ) 90 tablet 3   No  current facility-administered medications for this visit.     Allergies:   No known allergies   Social History:  The patient  reports that she has been smoking Cigarettes.  She has a 45 pack-year smoking history. She has never used smokeless tobacco. She reports that she uses illicit drugs (Marijuana). She reports that she does not drink alcohol.   Family History:   family history includes Colon cancer in her sister; Heart attack in her mother; Heart disease in her father and mother; Liver cancer in her brother; Throat cancer in her brother.    Review of Systems: Review of Systems  Constitutional: Negative.   Respiratory: Negative.   Cardiovascular: Negative.   Gastrointestinal: Negative.   Musculoskeletal: Negative.        Right groin pain  Neurological: Negative.   Psychiatric/Behavioral: Negative.   All other systems reviewed and are negative.    PHYSICAL EXAM: VS:  BP 98/65 mmHg  Pulse 58  Ht _0  (1.676 m)  Wt 140 lb 4 oz (63.617 kg)  BMI 22.65 kg/m2 , BMI Body mass index is 22.65 kg/(m^2). GEN: Well nourished, well developed, in no acute distress HEENT: normal Neck: no JVD, carotid bruits, or masses Cardiac: RRR; no murmurs, rubs, or gallops,no edema  Respiratory:  clear to auscultation bilaterally, normal work of breathing GI: soft, nontender, nondistended, + BS MS: no deformity or atrophy Skin: warm and dry, no rash Neuro:  Strength and sensation are intact Psych: euthymic mood, full affect Right groin was not inspected, family in the exam room with her today    Recent Labs: 09/20/2015: TSH 51.35* 01/09/2016: ALT 16; BUN 16; Creatinine, Ser 0.92; Hemoglobin 13.9; Platelets 266; Potassium 4.1; Sodium 136    Lipid Panel Lab Results  Component Value Date   CHOL 339* 09/20/2015   HDL 57.10 09/20/2015   LDLCALC 146* 03/12/2015   TRIG 224.0* 09/20/2015      Wt Readings from Last 3 Encounters:  01/10/16 140 lb 4 oz (63.617 kg)  01/09/16 140 lb (63.504 kg)   01/07/16 140 lb 6.4 oz (63.685 kg)       ASSESSMENT AND PLAN:  CAD, multiple vessel Patent stents on recent cardiac catheterization Postop right groin hematoma  Diabetes mellitus type 2 with peripheral artery disease (HCC) Recommended strict diet, no potato, low carbohydrates, she has follow-up with endocrine  Chronic systolic congestive heart failure (HCC) Appears relatively euvolemic on today's visit, blood pressure running low She feels symptomatic, lightheaded since medication change with higher enalapril Initial systolic pressure in the 00X on arrival, repeat in the 90s Recommended she decrease enalapril back to 5 mg daily If blood pressure continues to run low and she is symptomatic, we'll decrease his isosorbide down to  15 mg daily   hyperlipidemia Encouraged her to stay on her Lipitor  Takotsubo cardiomyopathy  Hospital records reviewed, recent loss in the family   depressed ejection fraction on echocardiogram  recommended repeat echocardiogram in 3 months time   groin hematoma  We'll need to monitor closely, feels better today, softer, less pain on ambulation  Recommended if she has recurrent symptoms of pain and swelling that she lay down, apply pressure, call our office  We would likely need a CT scan     Total encounter time more than 25 minutes  Greater than 50% was spent in counseling and coordination of care with the patient    Disposition:   F/U  3 months  No orders of the defined types were placed in this encounter.     Signed, Esmond Plants, M.D., Ph.D. 01/10/2016  [place], [place]  \

## 2016-01-15 ENCOUNTER — Other Ambulatory Visit: Payer: Medicare Other

## 2016-01-15 ENCOUNTER — Telehealth: Payer: Self-pay | Admitting: Cardiovascular Disease

## 2016-01-15 NOTE — Telephone Encounter (Signed)
Faxed FMLA forms to Raina Mina at [employer]

## 2016-01-15 NOTE — Telephone Encounter (Signed)
LMOV to patient regarding release of information packet.  - Mailed release packet to patient.  Received records reques VOYA, FMLA, forwarded to [facility] for processing.

## 2016-01-16 ENCOUNTER — Other Ambulatory Visit: Payer: Self-pay

## 2016-01-16 ENCOUNTER — Telehealth: Payer: Self-pay | Admitting: Internal Medicine

## 2016-01-16 DIAGNOSIS — E1151 Type 2 diabetes mellitus with diabetic peripheral angiopathy without gangrene: Secondary | ICD-10-CM

## 2016-01-16 MED ORDER — GLIPIZIDE 10 MG PO TABS: ORAL_TABLET | ORAL | Status: DC

## 2016-01-16 NOTE — Telephone Encounter (Signed)
Pt called and requested that her Glipizide be refilled and sent to [place] [place] in [place].

## 2016-02-01 ENCOUNTER — Ambulatory Visit: Payer: Medicare Other | Admitting: Physician Assistant

## 2016-02-01 ENCOUNTER — Telehealth: Payer: Self-pay | Admitting: Cardiovascular Disease

## 2016-02-01 NOTE — Telephone Encounter (Signed)
Patient called and is complaining of diarreah since her heart attack (1 month ago). Please call patient.

## 2016-02-01 NOTE — Telephone Encounter (Signed)
Patient states that she has had diarrhea since her heart attack. Told her to contact her primary care physician as they may be able to assess this problem. She verbalized understanding and had no further questions at this time.

## 2016-02-05 ENCOUNTER — Encounter: Payer: Self-pay | Admitting: Family Medicine

## 2016-02-05 ENCOUNTER — Ambulatory Visit: Payer: Medicare Other | Admitting: Internal Medicine

## 2016-02-05 ENCOUNTER — Ambulatory Visit (INDEPENDENT_AMBULATORY_CARE_PROVIDER_SITE_OTHER): Payer: Medicare Other | Admitting: Family Medicine

## 2016-02-05 VITALS — BP 102/60 | HR 61 | Temp 98.0°F | Wt 142.5 lb

## 2016-02-05 DIAGNOSIS — R1031 Right lower quadrant pain: Secondary | ICD-10-CM | POA: Insufficient documentation

## 2016-02-05 DIAGNOSIS — I208 Other forms of angina pectoris: Secondary | ICD-10-CM | POA: Diagnosis not present

## 2016-02-05 DIAGNOSIS — R197 Diarrhea, unspecified: Secondary | ICD-10-CM | POA: Insufficient documentation

## 2016-02-05 LAB — COMPREHENSIVE METABOLIC PANEL
ALT: 12 U/L (ref 0–35)
AST: 11 U/L (ref 0–37)
Albumin: 4 g/dL (ref 3.5–5.2)
Alkaline Phosphatase: 84 U/L (ref 39–117)
BUN: 19 mg/dL (ref 6–23)
CO2: 32 mEq/L (ref 19–32)
Calcium: 9.6 mg/dL (ref 8.4–10.5)
Chloride: 100 mEq/L (ref 96–112)
Creatinine, Ser: 1.01 mg/dL (ref 0.40–1.20)
GFR: 57.95 mL/min — ABNORMAL LOW (ref >60.00)
Glucose, Bld: 152 mg/dL — ABNORMAL HIGH (ref 70–99)
Potassium: 3.9 mEq/L (ref 3.5–5.1)
Sodium: 137 mEq/L (ref 135–145)
Total Bilirubin: 0.4 mg/dL (ref 0.2–1.2)
Total Protein: 6.9 g/dL (ref 6.0–8.3)

## 2016-02-05 LAB — POC URINALSYSI DIPSTICK (AUTOMATED)
Bilirubin, UA: NEGATIVE
Blood, UA: NEGATIVE
Ketones, UA: NEGATIVE
Leukocytes, UA: NEGATIVE
Nitrite, UA: NEGATIVE
Protein, UA: NEGATIVE
Spec Grav, UA: 1.025
Urobilinogen, UA: 0.2
pH, UA: 6

## 2016-02-05 NOTE — Progress Notes (Signed)
 Subjective:   Patient ID: Sarah Payne, female    DOB: 10/25/1945, 69 y.o.   MRN: [id number]  Sarah Payne is a pleasant 69 y.o. year old female who presents to clinic today with [place] Pain  on 02/05/2016  HPI:  Has had diarrhea and abdominal pain intermittently since her MI one month ago, acutely worsening over past 4 days.  H/o recurrent C diff, nausea vomiting and diarrhea- followed by GI. Was last seen by Jessica Zehr, PA on 10/12/15.  Notes reviewed.  Upper endoscopy on 11/06/15, colonoscopy 11/02/14.  Takes zofran as needed for nausea.  Feels feverish, no blood in stool.  CLINICAL DATA: Lower abdominal pain for 3 weeks. Diarrhea.  EXAM: CT ABDOMEN AND PELVIS WITH CONTRAST  TECHNIQUE: Multidetector CT imaging of the abdomen and pelvis was performed using the standard protocol following bolus administration of intravenous contrast.  CONTRAST: 100mL OMNIPAQUE IOHEXOL 300 MG/ML SOLN  COMPARISON: 05/15/2014  FINDINGS: Lower chest: No acute findings. Calcified atherosclerotic disease of the coronary arteries and cardiac stents are seen. There is a minimal pericardial effusion. The heart is normal in size.  Hepatobiliary: No masses or other significant abnormality.  Pancreas: No mass, inflammatory changes, or other significant abnormality.  Spleen: Within normal limits in size and appearance.  Adrenals/Urinary Tract: No masses identified. No evidence of hydronephrosis. Left partially exophytic benign-appearing renal cysts are noted. There is bilateral nonspecific perirenal fat stranding.  Stomach/Bowel: No evidence of obstruction, or abnormal fluid collections. There is a long segment of diffuse symmetric mild bowel wall thickening of the descending colon and sigmoid colon, on the background of scattered diverticulosis.  Vascular/Lymphatic: No pathologically enlarged lymph nodes. No evidence of abdominal aortic aneurysm. There is tortuosity  and advanced noncalcified and calcified atherosclerotic disease of the aorta. There is a segment of the infrarenal abdominal aorta, slightly inferior to the takeoff of the left renal artery, heavily affected by noncalcified plaque, where there is a focal intramural thrombus or ulcerated noncalcified plaque along the left posterior lateral aortic wall, image 37 /sequence 2 and image 70/ sequence 6.  Reproductive: No mass or other significant abnormality, post hysterectomy.  Other: None.  Musculoskeletal: No suspicious bone lesions identified. Post L3-S1 lumpectomy and posterior and anterior spinal fusion with intact orthopedic hardware, and extensive osteoarthritic changes.  IMPRESSION: Long segment of mild symmetric descending and sigmoid colon wall thickening on the background of scattered diverticulosis, likely representing colitis, which may be infectious inflammatory or ischemic. No evidence of focal diverticulitis, perforation or abscess formation.  Advance calcified and noncalcified atherosclerotic disease of the aorta. Focal intramural thrombus versus ulcerated noncalcified plaque within proximal infrarenal abdominal aorta.  Minimal, likely trivial, pericardial effusion.   Electronically Signed  By: Dobrinka Dimitrova M.D.  On: 08/17/2015 12:10 Current Outpatient Prescriptions on File Prior to Visit  Medication Sig Dispense Refill  . ALPRAZolam (XANAX) 0.25 MG tablet Take 1 tablet (0.25 mg total) by mouth 2 (two) times daily as needed for anxiety. 20 tablet 0  . aspirin 81 MG tablet Take 81 mg by mouth daily.      . atorvastatin (LIPITOR) 80 MG tablet Take 1 tablet (80 mg total) by mouth daily. 90 tablet 3  . Calcium & Magnesium Carbonates (MYLANTA PO) Take 15 mLs by mouth as needed (for indegestion).     . carvedilol (COREG) 3.125 MG tablet Take 6.25 mg by mouth 2 (two) times daily with a meal.    . clopidogrel (PLAVIX) 75 MG tablet TAKE 1 TABLET (75   MG  TOTAL) BY MOUTH DAILY. 90 tablet 3  . DULoxetine (CYMBALTA) 60 MG capsule TAKE 1 CAPSULE BY MOUTH DAILY. 90 capsule 3  . enalapril (VASOTEC) 10 MG tablet Take 1 tablet (10 mg total) by mouth daily. 30 tablet 3  . ezetimibe (ZETIA) 10 MG tablet Take 1 tablet (10 mg total) by mouth daily. 90 tablet 3  . glipiZIDE (GLUCOTROL) 10 MG tablet Take 5 mg Before breakfast and supper 180 tablet 1  . glucose blood (FREESTYLE LITE) test strip Check blood sugar 3 times daily to check blood sugars - Uncontrolled diabetes mellitus 300 each 3  . glucose monitoring kit (FREESTYLE) monitoring kit 1 each by Does not apply route as needed for other. PHARMACY PLEASE SUBSTITUTE WHATEVER GLUCOMETER YOU CARRY 1 each 1  . insulin aspart (NOVOLOG FLEXPEN) 100 UNIT/ML FlexPen Inject 12-14 Units into the skin 3 (three) times daily with meals. Normally takes 12 units 15 mL 3  . Insulin Glargine (LANTUS SOLOSTAR) 100 UNIT/ML Solostar Pen Inject 40 Units into the skin at bedtime. 15 mL 3  . Insulin Pen Needle (INSUPEN PEN NEEDLES) 32G X 4 MM MISC Use to inject insulin 4 times daily. 250 each 1  . isosorbide mononitrate (IMDUR) 30 MG 24 hr tablet Take 30 mg by mouth daily.    . lansoprazole (PREVACID) 15 MG capsule Take 15 mg by mouth daily. Reported on 11/06/2015    . levothyroxine (SYNTHROID, LEVOTHROID) 175 MCG tablet TAKE ONE TABLET BY MOUTH ONCE DAILY BEFORE BREAKFAST 30 tablet 2  . magnesium 30 MG tablet Take 30 mg by mouth daily. Reported on 09/20/2015    . ondansetron (ZOFRAN) 4 MG tablet Take 1 tab every 6 hours as needed for nausea. 30 tablet 2  . pantoprazole (PROTONIX) 40 MG tablet Take 1 tablet (40 mg total) by mouth daily. 90 tablet 3  . sitaGLIPtin (JANUVIA) 100 MG tablet Take 1 tablet (100 mg total) by mouth daily. 90 tablet 3  . traMADol (ULTRAM) 50 MG tablet Take 1 tablet (50 mg total) by mouth every 6 (six) hours as needed. 12 tablet 0  . traZODone (DESYREL) 100 MG tablet TAKE 1 TABLET (100 MG TOTAL) BY MOUTH AT  BEDTIME. (Patient taking differently: Take 200 mg by mouth at bedtime. ) 90 tablet 3   No current facility-administered medications on file prior to visit.    Allergies  Allergen Reactions  . No Known Allergies     Past Medical History  Diagnosis Date  . Diabetes mellitus   . Hypertension   . Hypothyroidism   . Depression   . Vascular dementia   . Tobacco abuse   . Chronic low back pain   . Coronary artery disease     a. multiple PCIs to the mLCx. b. stent to dRCA 08/2011 in setting of NSTEMI. c. DES to prox-mid LCx for ISR 09/2011. d.  DESx2 to prox-mRCA 07/2012. e. Takotsubo event 12/2015 with patent stents.  . Sleep apnea     mild-does not use cpap  . Impingement syndrome of left shoulder   . Tendonitis of left rotator cuff   . Diverticulosis   . Myocardial infarction ([place])     x 5  . GERD (gastroesophageal reflux disease)   . Cataract     BILATERAL REMOVED  . Hyperlipidemia   . Marijuana abuse   . COPD (chronic obstructive pulmonary disease) ([place])   . Takotsubo cardiomyopathy     a. 12/2015 - nephew committed suicide 1 week prior, sister  died the morning of presentation - initially called a STEMI; cath with patent stents. LVEF 25-30%.  . Frequent PVCs     a. Noted in hospital 12/2015.    Past Surgical History  Procedure Laterality Date  . Back surgery    . Appendectomy    . Abdominal hysterectomy    . Colonoscopy N/A 11/02/2014    Procedure: COLONOSCOPY;  Surgeon: Robert D Kaplan, MD;  Location: [facility];  Service: Endoscopy;  Laterality: N/A;  . Cataract extraction w/phaco Left 01/30/2015    Procedure: CATARACT EXTRACTION PHACO AND INTRAOCULAR LENS PLACEMENT (IOC);  Surgeon: William Porfilio, MD;  Location: [facility];  Service: Ophthalmology;  Laterality: Left;  [place] 00:47   . Cardiac catheterization  2013  . Coronary angioplasty  2012    stent x 3   . Coronary angioplasty with stent placement  2013  . Cardiac catheterization  2016  . Cataract extraction w/phaco  Right 02/13/2015    Procedure: CATARACT EXTRACTION PHACO AND INTRAOCULAR LENS PLACEMENT (IOC);  Surgeon: William Porfilio, MD;  Location: [facility];  Service: Ophthalmology;  Laterality: Right;  cassette lot # [id number]  00:29.9 AP  20.7 CDE  6.20  . Cardiac catheterization N/A 01/01/2016    Procedure: Left Heart Cath and Coronary Angiography;  Surgeon: Jayadeep S Varanasi, MD;  Location: [facility];  Service: Cardiovascular;  Laterality: N/A;    Family History  Problem Relation Age of Onset  . Heart attack Mother     First MI @ 54 - Died @ 73  . Heart disease Mother   . Heart disease Father     Died @ 90  . Throat cancer Brother   . Liver cancer Brother   . Colon cancer Sister     Social History   Social History  . Marital Status: Married    Spouse Name: N/A  . Number of Children: N/A  . Years of Education: N/A   Occupational History  . Not on file.   Social History Main Topics  . Smoking status: Current Every Day Smoker -- 1.00 packs/day for 45 years    Types: Cigarettes  . Smokeless tobacco: Never Used     Comment: Has cut back, trying to quit.   . Alcohol Use: No  . Drug Use: Yes    Special: Marijuana     Comment: last night 4.13.16  . Sexual Activity: Not on file   Other Topics Concern  . Not on file   Social History Narrative   Lives at home with her husband in [place].  Previously used marijuana - quit.      Regular exercise: no/ pain from a frozen rotator cuff   Caffeine use: coffee daily and pepsi      Does not have a living will.   Daughters and husband know her wishes- would desire CPR but not prolonged life support if futile   The PMH, PSH, Social History, Family History, Medications, and allergies have been reviewed in [facility], and have been updated if relevant.    Review of Systems  Constitutional: Positive for fever and fatigue.  Gastrointestinal: Positive for abdominal pain and diarrhea. Negative for nausea, constipation and blood in  stool.  Genitourinary: Negative.   Musculoskeletal: Negative.   Neurological: Negative.   All other systems reviewed and are negative.      Objective:    BP 102/60 mmHg  Pulse 61  Temp(Src) 98 F (36.7 C) (Oral)  Wt 142 lb 8 oz (64.638 kg)    SpO2 96%   Physical Exam  Constitutional: She is oriented to person, place, and time. She appears well-developed and well-nourished. No distress.  HENT:  Head: Normocephalic.  Eyes: Conjunctivae are normal.  Cardiovascular: Normal rate.   Pulmonary/Chest: Effort normal.  Abdominal:  TTP over right lower quadrant +guarding, no rebound tenderness  Musculoskeletal: Normal range of motion.  Neurological: She is alert and oriented to person, place, and time. No cranial nerve deficit.  Skin: Skin is warm and dry. She is not diaphoretic.  Psychiatric: She has a normal mood and affect. Her behavior is normal. Judgment and thought content normal.  Nursing note and vitals reviewed.         Assessment & Plan:   No diagnosis found. No Follow-up on file.   

## 2016-02-05 NOTE — Patient Instructions (Signed)
Please stop by to see Sarah Payne on your way out.  I am so sorry for your recent losses.

## 2016-02-05 NOTE — Assessment & Plan Note (Signed)
New with diarrhea- per pt, similar to previous symptoms of colitis. CBC, c diff toxin, CMET and CT of abd/pelvis for further evaluation. The patient indicates understanding of these issues and agrees with the plan.

## 2016-02-05 NOTE — Addendum Note (Signed)
Addended by: Ellamae Sia on: 02/05/2016 02:25 PM   Modules accepted: Orders

## 2016-02-05 NOTE — Progress Notes (Signed)
Pre visit review using our clinic review tool, if applicable. No additional management support is needed unless otherwise documented below in the visit note. 

## 2016-02-06 ENCOUNTER — Ambulatory Visit (INDEPENDENT_AMBULATORY_CARE_PROVIDER_SITE_OTHER): Payer: Medicare Other | Admitting: Internal Medicine

## 2016-02-06 ENCOUNTER — Encounter: Payer: Self-pay | Admitting: Internal Medicine

## 2016-02-06 ENCOUNTER — Ambulatory Visit: Payer: Medicare Other | Admitting: Cardiovascular Disease

## 2016-02-06 ENCOUNTER — Ambulatory Visit (INDEPENDENT_AMBULATORY_CARE_PROVIDER_SITE_OTHER)
Admission: RE | Admit: 2016-02-06 | Discharge: 2016-02-06 | Disposition: A | Discharge status: Home / Self Care | Payer: Medicare Other | Source: Ambulatory Visit | Attending: Family Medicine | Admitting: Family Medicine

## 2016-02-06 VITALS — BP 140/84 | HR 55 | Ht 66.5 in | Wt 142.0 lb

## 2016-02-06 DIAGNOSIS — K5790 Diverticulosis of intestine, part unspecified, without perforation or abscess without bleeding: Secondary | ICD-10-CM | POA: Diagnosis not present

## 2016-02-06 DIAGNOSIS — E1151 Type 2 diabetes mellitus with diabetic peripheral angiopathy without gangrene: Secondary | ICD-10-CM

## 2016-02-06 DIAGNOSIS — R197 Diarrhea, unspecified: Secondary | ICD-10-CM | POA: Diagnosis not present

## 2016-02-06 DIAGNOSIS — I208 Other forms of angina pectoris: Secondary | ICD-10-CM | POA: Diagnosis not present

## 2016-02-06 DIAGNOSIS — R1031 Right lower quadrant pain: Secondary | ICD-10-CM | POA: Diagnosis not present

## 2016-02-06 DIAGNOSIS — E038 Other specified hypothyroidism: Secondary | ICD-10-CM

## 2016-02-06 LAB — CBC WITH DIFFERENTIAL/PLATELET
Basophils Absolute: 0 10*3/uL (ref 0.0–0.1)
Basophils Relative: 0.3 % (ref 0.0–3.0)
Eosinophils Absolute: 0.2 10*3/uL (ref 0.0–0.7)
Eosinophils Relative: 2 % (ref 0.0–5.0)
HCT: 39.6 % (ref 36.0–46.0)
Hemoglobin: 13.1 g/dL (ref 12.0–15.0)
Lymphocytes Relative: 25.9 % (ref 12.0–46.0)
Lymphs Abs: 2.7 10*3/uL (ref 0.7–4.0)
MCHC: 33.1 g/dL (ref 30.0–36.0)
MCV: 91.5 fl (ref 78.0–100.0)
Monocytes Absolute: 0.5 10*3/uL (ref 0.1–1.0)
Monocytes Relative: 4.8 % (ref 3.0–12.0)
Neutro Abs: 7 10*3/uL (ref 1.4–7.7)
Neutrophils Relative %: 67 % (ref 43.0–77.0)
Platelets: 245 10*3/uL (ref 150.0–400.0)
RBC: 4.32 Mil/uL (ref 3.87–5.11)
RDW: 13.2 % (ref 11.5–15.5)
WBC: 10.4 10*3/uL (ref 4.0–10.5)

## 2016-02-06 LAB — T4, FREE: Free T4: 2.16 ng/dL — ABNORMAL HIGH (ref 0.60–1.60)

## 2016-02-06 LAB — TSH: TSH: 0.24 u[IU]/mL — ABNORMAL LOW (ref 0.35–4.50)

## 2016-02-06 LAB — HEMOGLOBIN A1C: Hgb A1c MFr Bld: 11.2 % — ABNORMAL HIGH (ref 4.6–6.5)

## 2016-02-06 MED ORDER — LEVOTHYROXINE SODIUM 150 MCG PO TABS: 150.0000 ug | ORAL_TABLET | Freq: Every day | ORAL | Status: DC

## 2016-02-06 NOTE — Progress Notes (Signed)
Patient ID: Sarah Payne, female   DOB: 08-15-1945, 70 y.o.   MRN: [id number]  HPI: Sarah Payne is a 70 y.o.-year-old female, returning for f/u for DM2, dx 2003, insulin-dependent since 2013, uncontrolled, with complications (CAD, sCHF, PAD, CKD, PN) and also complicated by dementia and medication noncompliance. She also has hypothyroidism, also uncontrolled. Last visit 2 mo ago.  Her sister died this summer. Her nephew killed himself this summer, also. She had an AMI earlier this month.She is very depressed.  She also has colitis.  DM2: Last hemoglobin A1c was: Lab Results  Component Value Date   HGBA1C 10.8* 09/20/2015   HGBA1C 8.6* 01/04/2015   HGBA1C 10.9* 08/02/2014   Pt is on a regimen of: - Lantus 30 >> 40 units at bedtime - Novolog (take this 10 min before a meal): taking 14 units with lunch only  - Glipizide to 5 mg 2x a day - Januvia 100 mg daily in am, before b'fast   Breakfast Lunch Dinner Bedtime   Januvia 100 mg  1 tab     Glipizide 10 mg  1/2 tab  1/2 tab   Lantus     30 >> 40 units   NovoLog   5-9 units       She has been on NPH insulin in the past, which we were able to stop at last visit. Since then, she was started on Lantus and NovoLog by PCP.  Pt checks her sugars 4-5x a day and they are: - am: 145-288 >> 80-200 (max 286) >> 68-204 >> 78-204 >> 99-317 >> 160-277, 282 - 2h after b'fast: 156-267 >> ~100 >> 131, 135, 146, 149 >> 224, 355 >> 178 - before lunch: 89-212 >> 159-185 >> 140-170 >> 78-178 >> 60-311 >> 217 - 2h after lunch: 134-230 >> lowest 63-204 >> 60-189, most <140 >> 94-438 >> 79-166  - before dinner: 64-219 >> 140-202 >> 98 >> 69-125 >> 68- 249 >> 72 - 2h after dinner: 71, 305 >> ~100 >> 79-108 >> 370 >> 278 (300-400 in the hospital) - bedtime: 91-155 >> 119-170 >> ~100 >> 110-149 (253) >> see above >> 192 - nighttime: 54, 58, 145 >> 270 + lows. Lowest sugar was 54 >> 60 >> 72; she has ? hypoglycemia awareness.  Highest sugar was 253 >> 398  >> 400s.  - + CKD, last BUN/creatinine:  Lab Results  Component Value Date   BUN 16 01/09/2016   CREATININE 0.92 01/09/2016  She is on Enalapril. - last set of lipids: Lab Results  Component Value Date   CHOL 339* 09/20/2015   HDL 57.10 09/20/2015   LDLCALC 146* 03/12/2015   LDLDIRECT 237.0 09/20/2015   TRIG 224.0* 09/20/2015   CHOLHDL 6 09/20/2015  She is on Lipitor.She is on Zetia. She is on ASA 81. - last eye exam was in 2016. No DR. She had cataract sx. - + numbness and tingling in her feet.  Hypothyroidism: She is on levothyroxine 175 g - now takes it daily.  She takes levothyroxine: - in am, fasting - with water - Separated by more than 30 minutes from breakfast - On calcium, acid reflux medicines >> later - No multivitamins - No iron   Latest TFTs: Lab Results  Component Value Date   TSH 51.35* 09/20/2015   TSH 0.52 06/18/2015   TSH 53.23* 06/01/2015   TSH 40.79* 03/12/2015   TSH 53.83* 12/21/2014   TSH 60.34* 08/16/2014   TSH > 100 08/02/2014  TSH 0.99 04/26/2014   TSH 0.06* 11/22/2013   TSH 44.2* 08/19/2013   FREET4 0.27* 09/20/2015   FREET4 0.57* 03/12/2015   FREET4 0.21* 12/21/2014   FREET4 0.30* 08/16/2014   FREET4 1.03 04/26/2014   FREET4 1.58 11/22/2013   ROS: Constitutional: no more weight loss, + fatigue, no urination, no nocturia, + feeling hot Eyes: no more blurry vision, no xerophthalmia ENT: no sore throat, no nodules palpated in throat, no dysphagia/odynophagia, no hoarseness Cardiovascular: no CP/SOB/no palpitations/leg swelling Respiratory: + cough/no SOB Gastrointestinal: + N/ V/+ D/no C/+ heartburn Musculoskeletal: + both: muscle/joint aches Skin: no rashes Neurological: no tremors/numbness/tingling/dizziness  I reviewed pt's medications, allergies, PMH, social hx, family hx, and changes were documented in the history of present illness. Otherwise, unchanged from my initial visit note.  PE: BP 140/84 mmHg  Pulse 55  Ht 5'  6.5" (1.689 m)  Wt 142 lb (64.411 kg)  BMI 22.58 kg/m2  SpO2 97% Body mass index is 22.58 kg/(m^2). Wt Readings from Last 3 Encounters:  02/06/16 142 lb (64.411 kg)  02/05/16 142 lb 8 oz (64.638 kg)  01/10/16 140 lb 4 oz (63.617 kg)   Constitutional: normal weight, in NAD Eyes: PERRLA, EOMI, no exophthalmos ENT: moist mucous membranes, no thyromegaly, no cervical lymphadenopathy Cardiovascular: RRR, No MRG Respiratory: CTA B Gastrointestinal: abdomen soft, NT, ND, BS+ Musculoskeletal: no deformities, strength intact in all 4 Skin: moist, warm, no rashes, very dry skin Neurological: no tremor with outstretched hands, DTR normal in all 4  ASSESSMENT: 1. DM2, insulin-dependent, uncontrolled, with complications - med noncompliance (dementia) - CAD - had 3 AMI in 2011 >> 7 stents placed; new AMI February 15, 2016 after her sister's death - sCHF - PAD - CKD - PN  2. Hypothyroidism - Uncontrolled  PLAN:  1. Patient with long-standing uncontrolled diabetes, on oral + insulin antidiabetic regimen, with history of poor med compliance, now slightly better control. Highest sugars are at bedtime and subsequently in am >> will add Novolog with dinner (did not add as advised at last visit) - I suggested to:  Patient Instructions   Please continue - Lantus 40 units at bedtime - Glipizide 5 mg 2x a day - Januvia 100 mg daily in am, before b'fast  Change: - Novolog (take this 10 min before a meal): 10-14 units before lunch 8-12 units before dinner   Breakfast Lunch Dinner Bedtime   Januvia 100 mg  1 tab     Glipizide 10 mg  1/2 tab  1/2 tab   Lantus     40 units   NovoLog   1-14 units 8-12 units    Please return in 1.5 months with your sugar log.   - continue checking sugars at different times of the day - check 3 times a day, rotating checks - She is up to date with eye exams >> needs a new one soon - Reviewed her latest HbA1c, which was very high, at 10.8% >> recheck today - Return to  clinic in 1.5 mo with sugar log   2. Hypothyroidism - Patient's thyroid tests have been very uncontrolled, and this could be from poor medication compliance +/- taking her levothyroxine along with calcium and PPIs. - We discussed about taking levothyroxine correctly: Every day, fasting, separated by at least 30 minutes from breakfast, and separated by at least 4 hours from calcium, iron, multivitamins, acid reflux medicines. She is now taking it correctly- For now, will continue levothyroxine 175 g daily - I will recheck her TFTs  today  Needs a refill LT4 at [facility] on 02/06/2016  Component Date Value Ref Range Status  . Free T4 02/06/2016 2.16* 0.60 - 1.60 ng/dL Final  . TSH 02/06/2016 0.24* 0.35 - 4.50 uIU/mL Final  . Hgb A1c MFr Bld 02/06/2016 11.2* 4.6 - 6.5 % Final   Glycemic Control Guidelines for People with Diabetes:Non Diabetic:  <6%Goal of Therapy: <7%Additional Action Suggested:  >8%    HbA1c very high >> needs compliance with meds! TSH low >> will need to decrease the LT4 dose to 150 mcg daily. Will recheck TFTs at next visit.

## 2016-02-06 NOTE — Patient Instructions (Signed)
Please continue Levothyroxine 175 mcg daily.  Take the thyroid hormone every day, with water, at least 30 minutes before breakfast, separated by at least 4 hours from: - acid reflux medications - calcium - iron - multivitamins  Please continue - Lantus 40 units at bedtime - Glipizide 5 mg 2x a day - Januvia 100 mg daily in am, before b'fast  Change: - Novolog (take this 10 min before a meal): 10-14 units before lunch 8-12 units before dinner   Breakfast Lunch Dinner Bedtime   Januvia 100 mg  1 tab     Glipizide 10 mg  1/2 tab  1/2 tab   Lantus     40 units   NovoLog   1-14 units 8-12 units    Please return in 1.5 months with your sugar log.

## 2016-02-06 NOTE — Addendum Note (Signed)
Addended by: Daralene Milch C on: 02/06/2016 01:07 PM   Modules accepted: Orders

## 2016-02-07 LAB — C. DIFFICILE GDH AND TOXIN A/B
C. difficile GDH: NOT DETECTED
C. difficile Toxin A/B: NOT DETECTED

## 2016-03-27 ENCOUNTER — Ambulatory Visit: Payer: Medicare Other | Admitting: Internal Medicine

## 2016-04-04 ENCOUNTER — Ambulatory Visit (INDEPENDENT_AMBULATORY_CARE_PROVIDER_SITE_OTHER)
Admission: RE | Admit: 2016-04-04 | Discharge: 2016-04-04 | Disposition: A | Discharge status: Home / Self Care | Payer: Medicare Other | Source: Ambulatory Visit | Attending: Primary Care | Admitting: Primary Care

## 2016-04-04 ENCOUNTER — Encounter: Payer: Self-pay | Admitting: Primary Care

## 2016-04-04 ENCOUNTER — Ambulatory Visit: Payer: Medicare Other | Admitting: Family Medicine

## 2016-04-04 ENCOUNTER — Ambulatory Visit (INDEPENDENT_AMBULATORY_CARE_PROVIDER_SITE_OTHER): Payer: Medicare Other | Admitting: Primary Care

## 2016-04-04 VITALS — BP 124/86 | HR 76 | Temp 98.6°F | Ht 66.5 in | Wt 146.1 lb

## 2016-04-04 DIAGNOSIS — M549 Dorsalgia, unspecified: Secondary | ICD-10-CM | POA: Diagnosis not present

## 2016-04-04 DIAGNOSIS — I639 Cerebral infarction, unspecified: Secondary | ICD-10-CM | POA: Diagnosis not present

## 2016-04-04 DIAGNOSIS — I208 Other forms of angina pectoris: Secondary | ICD-10-CM

## 2016-04-04 LAB — POC URINALSYSI DIPSTICK (AUTOMATED)
Bilirubin, UA: NEGATIVE
Blood, UA: NEGATIVE
Glucose, UA: NEGATIVE
Ketones, UA: NEGATIVE
Leukocytes, UA: NEGATIVE
Nitrite, UA: NEGATIVE
Protein, UA: NEGATIVE
Spec Grav, UA: >=1.03
Urobilinogen, UA: NEGATIVE
pH, UA: 6

## 2016-04-04 LAB — CBC WITH DIFFERENTIAL/PLATELET
Basophils Absolute: 0 cells/uL (ref 0–200)
Basophils Relative: 0 %
Eosinophils Absolute: 170 cells/uL (ref 15–500)
Eosinophils Relative: 2 %
HCT: 39 % (ref 35.0–45.0)
Hemoglobin: 13.1 g/dL (ref 11.7–15.5)
Lymphocytes Relative: 30 %
Lymphs Abs: 2550 cells/uL (ref 850–3900)
MCH: 30.4 pg (ref 27.0–33.0)
MCHC: 33.6 g/dL (ref 32.0–36.0)
MCV: 90.5 fL (ref 80.0–100.0)
MPV: 11.4 fL (ref 7.5–12.5)
Monocytes Absolute: 510 cells/uL (ref 200–950)
Monocytes Relative: 6 %
Neutro Abs: 5270 cells/uL (ref 1500–7800)
Neutrophils Relative %: 62 %
Platelets: 267 10*3/uL (ref 140–400)
RBC: 4.31 MIL/uL (ref 3.80–5.10)
RDW: 14.2 % (ref 11.0–15.0)
WBC: 8.5 10*3/uL (ref 3.8–10.8)

## 2016-04-04 MED ORDER — METHOCARBAMOL 500 MG PO TABS: 500.0000 mg | ORAL_TABLET | Freq: Three times a day (TID) | ORAL | 0 refills | Status: DC | PRN

## 2016-04-04 NOTE — Progress Notes (Signed)
Subjective:    Patient ID: Sarah Payne, female    DOB: [birth year], 70 y.o.   MRN: [id number]  HPI  Ms. Sparlin is a 70 year old female who presents today with a chief complaint of urinary frequency. She also reports pelvic, flank, and lower back pain, nausea. Her symptoms have been present for the past 3 days. She describes her low back pain as a tightness/spasm. She's taken AZO without any improvement. Denies dysuria, hematuria, vaginal discharge, vaginal itching, recent injury or trauma, history of kidney stones. She doesn't drink much water, as she mostly drinks sweet tea and pepsi.   Review of Systems  Constitutional: Negative for fatigue and fever.  Respiratory: Negative for shortness of breath.   Gastrointestinal: Negative for abdominal pain and nausea.  Genitourinary: Positive for flank pain, frequency and pelvic pain. Negative for difficulty urinating, dysuria, hematuria and vaginal discharge.  Neurological: Negative for dizziness and weakness.       Past Medical History:  Diagnosis Date  . Cataract    BILATERAL REMOVED  . Chronic low back pain   . COPD (chronic obstructive pulmonary disease) ([place])   . Coronary artery disease    a. multiple PCIs to the mLCx. b. stent to dRCA 08/2011 in setting of NSTEMI. c. DES to prox-mid LCx for ISR 09/2011. d.  DESx2 to prox-mRCA 07/2012. e. Takotsubo event 12/2015 with patent stents.  . Depression   . Diabetes mellitus   . Diverticulosis   . Frequent PVCs    a. Noted in hospital 12/2015.  Marland Kitchen GERD (gastroesophageal reflux disease)   . Hyperlipidemia   . Hypertension   . Hypothyroidism   . Impingement syndrome of left shoulder   . Marijuana abuse   . Myocardial infarction ([place])    x 5  . Sleep apnea    mild-does not use cpap  . Takotsubo cardiomyopathy    a. 12/2015 - nephew committed suicide 1 week prior, sister died the morning of presentation - initially called a STEMI; cath with patent stents. LVEF 25-30%.  . Tendonitis of left  rotator cuff   . Tobacco abuse   . Vascular dementia      Social History   Social History  . Marital status: Married    Spouse name: N/A  . Number of children: N/A  . Years of education: N/A   Occupational History  . Not on file.   Social History Main Topics  . Smoking status: Current Every Day Smoker    Packs/day: 1.00    Years: 45.00    Types: Cigarettes  . Smokeless tobacco: Never Used     Comment: Has cut back, trying to quit.   . Alcohol use No  . Drug use:     Types: Marijuana     Comment: last night 4.13.16  . Sexual activity: Not on file   Other Topics Concern  . Not on file   Social History Narrative   Lives at home with her husband in [place].  Previously used marijuana - quit.      Regular exercise: no/ pain from a frozen rotator cuff   Caffeine use: coffee daily and pepsi      Does not have a living will.   Daughters and husband know her wishes- would desire CPR but not prolonged life support if futile    Past Surgical History:  Procedure Laterality Date  . ABDOMINAL HYSTERECTOMY    . APPENDECTOMY    . BACK SURGERY    .  CARDIAC CATHETERIZATION  2013  . CARDIAC CATHETERIZATION  2016  . CARDIAC CATHETERIZATION N/A 01/01/2016   Procedure: Left Heart Cath and Coronary Angiography;  Surgeon: Jettie Booze, MD;  Location: [facility];  Service: Cardiovascular;  Laterality: N/A;  . CATARACT EXTRACTION W/PHACO Left 01/30/2015   Procedure: CATARACT EXTRACTION PHACO AND INTRAOCULAR LENS PLACEMENT (IOC);  Surgeon: Birder Robson, MD;  Location: [facility];  Service: Ophthalmology;  Laterality: Left;  [place] 00:47   . CATARACT EXTRACTION W/PHACO Right 02/13/2015   Procedure: CATARACT EXTRACTION PHACO AND INTRAOCULAR LENS PLACEMENT (IOC);  Surgeon: Birder Robson, MD;  Location: [facility];  Service: Ophthalmology;  Laterality: Right;  cassette lot # [id number] H [place]  00:29.9 AP  20.7 CDE  6.20  . COLONOSCOPY N/A 11/02/2014   Procedure: COLONOSCOPY;   Surgeon: Inda Castle, MD;  Location: [facility];  Service: Endoscopy;  Laterality: N/A;  . CORONARY ANGIOPLASTY  2012   stent x 3   . CORONARY ANGIOPLASTY WITH STENT PLACEMENT  2013    Family History  Problem Relation Age of Onset  . Heart attack Mother     First MI @ 32 - Died @ 50  . Heart disease Mother   . Heart disease Father     Died @ 52  . Throat cancer Brother   . Liver cancer Brother   . Colon cancer Sister     Allergies  Allergen Reactions  . No Known Allergies     Current Outpatient Prescriptions on File Prior to Visit  Medication Sig Dispense Refill  . ALPRAZolam (XANAX) 0.25 MG tablet Take 1 tablet (0.25 mg total) by mouth 2 (two) times daily as needed for anxiety. 20 tablet 0  . aspirin 81 MG tablet Take 81 mg by mouth daily.      Marland Kitchen atorvastatin (LIPITOR) 80 MG tablet Take 1 tablet (80 mg total) by mouth daily. 90 tablet 3  . Calcium & Magnesium Carbonates (MYLANTA PO) Take 15 mLs by mouth as needed (for indegestion).     . carvedilol (COREG) 3.125 MG tablet Take 6.25 mg by mouth 2 (two) times daily with a meal.    . clopidogrel (PLAVIX) 75 MG tablet TAKE 1 TABLET (75 MG TOTAL) BY MOUTH DAILY. 90 tablet 3  . DULoxetine (CYMBALTA) 60 MG capsule TAKE 1 CAPSULE BY MOUTH DAILY. 90 capsule 3  . enalapril (VASOTEC) 10 MG tablet Take 1 tablet (10 mg total) by mouth daily. 30 tablet 3  . ezetimibe (ZETIA) 10 MG tablet Take 1 tablet (10 mg total) by mouth daily. 90 tablet 3  . glipiZIDE (GLUCOTROL) 10 MG tablet Take 5 mg Before breakfast and supper (Patient taking differently: Take 5 mg Before breakfast and supper) 180 tablet 1  . glucose blood (FREESTYLE LITE) test strip Check blood sugar 3 times daily to check blood sugars - Uncontrolled diabetes mellitus 300 each 3  . glucose monitoring kit (FREESTYLE) monitoring kit 1 each by Does not apply route as needed for other. PHARMACY PLEASE SUBSTITUTE WHATEVER GLUCOMETER YOU CARRY 1 each 1  . insulin aspart (NOVOLOG  FLEXPEN) 100 UNIT/ML FlexPen Inject 12-14 Units into the skin 3 (three) times daily with meals. Normally takes 12 units 15 mL 3  . Insulin Glargine (LANTUS SOLOSTAR) 100 UNIT/ML Solostar Pen Inject 40 Units into the skin at bedtime. 15 mL 3  . Insulin Pen Needle (INSUPEN PEN NEEDLES) 32G X 4 MM MISC Use to inject insulin 4 times daily. 250 each 1  . isosorbide mononitrate (  IMDUR) 30 MG 24 hr tablet Take 30 mg by mouth daily.    . lansoprazole (PREVACID) 15 MG capsule Take 15 mg by mouth daily. Reported on 11/06/2015    . levothyroxine (SYNTHROID, LEVOTHROID) 150 MCG tablet Take 1 tablet (150 mcg total) by mouth daily. 45 tablet 1  . magnesium 30 MG tablet Take 30 mg by mouth daily. Reported on 09/20/2015    . ondansetron (ZOFRAN) 4 MG tablet Take 1 tab every 6 hours as needed for nausea. 30 tablet 2  . pantoprazole (PROTONIX) 40 MG tablet Take 1 tablet (40 mg total) by mouth daily. 90 tablet 3  . sitaGLIPtin (JANUVIA) 100 MG tablet Take 1 tablet (100 mg total) by mouth daily. 90 tablet 3  . traMADol (ULTRAM) 50 MG tablet Take 1 tablet (50 mg total) by mouth every 6 (six) hours as needed. 12 tablet 0  . traZODone (DESYREL) 100 MG tablet TAKE 1 TABLET (100 MG TOTAL) BY MOUTH AT BEDTIME. (Patient taking differently: Take 200 mg by mouth at bedtime. ) 90 tablet 3   No current facility-administered medications on file prior to visit.     BP 124/86   Pulse 76   Temp 98.6 F (37 C) (Oral)   Ht 5' 6.5" (1.689 m)   Wt 146 lb 1.9 oz (66.3 kg)   SpO2 98%   BMI 23.23 kg/m    Objective:   Physical Exam  Constitutional: She is oriented to person, place, and time. She appears well-nourished.  Appears uncomfortable  Neck: Neck supple.  Cardiovascular: Normal rate and regular rhythm.   Pulmonary/Chest: Effort normal and breath sounds normal.  Abdominal: Soft. Bowel sounds are normal. There is tenderness in the suprapubic area. There is CVA tenderness. There is no tenderness at McBurney's point and  negative Murphy's sign.  Neurological: She is alert and oriented to person, place, and time.  Skin: Skin is warm and dry.          Assessment & Plan:  Back/flank pain:  Also with urinary frequency for the past 3 days. No vaginal symptoms or other urinary symptoms. No improvement with AZO OTC. Exam today with CVA tenderness bilaterally, suprapubic tenderness. Appears uncomfortable, not acutely ill or sickly. Vital stable. UA: Negative. Culture sent given symptoms and presentation. KUB today to rule out renal stone. CBC and CMP obtained today given moderate CVA tenderness, concern for pyelonephritis although lacks other symptoms. Could be MSK related given most of pain to lower back. We'll trial short-term supply of Robaxin to use as needed. Will await results. Emergency department precautions provided over the weekend. Patient appears stable for outpatient treatment at this time.  Sheral Flow, NP

## 2016-04-04 NOTE — Patient Instructions (Signed)
Complete lab and xray work prior to leaving today. I will notify you of your results once received.   You may take Robaxin tablets every 8 hours as needed for low back pain/smasm.  Ensure you are consuming 64 ounces of water daily.  Please go to Urgent Care or the Emergency Department over the weekend if your symptoms progress.  It was a pleasure meeting you!

## 2016-04-04 NOTE — Addendum Note (Signed)
Addended by: Royann Shivers A on: 04/04/2016 05:21 PM   Modules accepted: Orders

## 2016-04-04 NOTE — Progress Notes (Signed)
Pre visit review using our clinic review tool, if applicable. No additional management support is needed unless otherwise documented below in the visit note. 

## 2016-04-05 LAB — BASIC METABOLIC PANEL
BUN: 19 mg/dL (ref 7–25)
CO2: 24 mmol/L (ref 20–31)
Calcium: 9.4 mg/dL (ref 8.6–10.4)
Chloride: 102 mmol/L (ref 98–110)
Creat: 1.06 mg/dL — ABNORMAL HIGH (ref 0.50–0.99)
Glucose, Bld: 151 mg/dL — ABNORMAL HIGH (ref 65–99)
Potassium: 4.2 mmol/L (ref 3.5–5.3)
Sodium: 139 mmol/L (ref 135–146)

## 2016-04-06 LAB — URINE CULTURE

## 2016-04-07 ENCOUNTER — Telehealth: Payer: Self-pay | Admitting: Family Medicine

## 2016-04-07 NOTE — Telephone Encounter (Signed)
Patient visited last week with back pain, not abdominal pain. Is she having abdominal pain? If so where is her pain located? Any vomiting or diarrhea?

## 2016-04-07 NOTE — Telephone Encounter (Signed)
Pt called stating her stomach pain is not any better she is at a pain level of 10   And wanted to know what she needed to do  Pt stated she had xray done friday

## 2016-04-08 NOTE — Telephone Encounter (Signed)
Patient stated that she had this pain since last Wednesday. The pain is on the lower left side and pain can be felt to the lower back. Patient had diarrhea and lots of nausea.

## 2016-04-09 ENCOUNTER — Other Ambulatory Visit: Payer: Self-pay

## 2016-04-09 ENCOUNTER — Ambulatory Visit (INDEPENDENT_AMBULATORY_CARE_PROVIDER_SITE_OTHER): Payer: Medicare Other

## 2016-04-09 DIAGNOSIS — E1151 Type 2 diabetes mellitus with diabetic peripheral angiopathy without gangrene: Secondary | ICD-10-CM

## 2016-04-09 DIAGNOSIS — I5022 Chronic systolic (congestive) heart failure: Secondary | ICD-10-CM

## 2016-04-09 DIAGNOSIS — I2510 Atherosclerotic heart disease of native coronary artery without angina pectoris: Secondary | ICD-10-CM

## 2016-04-09 DIAGNOSIS — I5181 Takotsubo syndrome: Secondary | ICD-10-CM

## 2016-04-09 DIAGNOSIS — E785 Hyperlipidemia, unspecified: Secondary | ICD-10-CM

## 2016-04-09 NOTE — Telephone Encounter (Signed)
This is a difficult case with recurrent pain that has been followed by GI.  I would ask her to follow up with GI since your work up was appropriate and negative. I question if this is referred pain from her abdomen What are your thoughts?

## 2016-04-09 NOTE — Telephone Encounter (Signed)
Thanks. I agree. With a normal CT scan in July, unremarkable assessment, normal labs and UA recently, I really don't have much to add. I'll have her follow up with GI.  Sarah Payne, please call patient and have her follow up with her GI doctor if her symptoms have persisted. Both Dr. Deborra Medina and are unsure of the cause of her pain as her recent labs and CT scan in July was stable.

## 2016-04-09 NOTE — Telephone Encounter (Signed)
Spoken and notified patient of [name]'s comments. Patient verbalized understanding. 

## 2016-04-09 NOTE — Telephone Encounter (Signed)
Sarah Payne,  Take a look at my note from her visit. Normal labs. CT abdomen/pelvis completed in July and looks good.  Any recommendations?

## 2016-04-10 ENCOUNTER — Telehealth: Payer: Self-pay | Admitting: Gastroenterology

## 2016-04-10 ENCOUNTER — Other Ambulatory Visit: Payer: Self-pay

## 2016-04-10 DIAGNOSIS — R1013 Epigastric pain: Secondary | ICD-10-CM

## 2016-04-10 NOTE — Telephone Encounter (Signed)
Based on description likely musculoskeletal/spasm causing the bandlike pain around her lower abdomen radiating to the back. Agree with using Robaxin as needed. I reviewed the recent labs, and x-ray. Please advise patient to do lipase, amylase and LFT to exclude possible acute pancreatitis though seems unlikely

## 2016-04-10 NOTE — Progress Notes (Signed)
Labs ordered. Patient agrees to come in tomorrow.

## 2016-04-10 NOTE — Telephone Encounter (Signed)
Patient has been seen by her PCP. Was told to contact GI. She used a laxative on Monday. Not constipated or having any diarrhea. Her abdomen hurts below her waist wrapping around to her back. She cannot pinpoint the area. UTI was ruled out. Abdominal xray unremarkable. She has not taken the Robaxin given to her by PCP. She has not tried heat. Afebrile.Hurts standing or sitting but not when lying down.I have asked her to try the Robaxin and a low setting of her heating pad. Is there anything else she should do. No appointment openings for 2 weeks. No APP openings until October.

## 2016-04-11 ENCOUNTER — Other Ambulatory Visit: Payer: Medicare Other

## 2016-04-11 ENCOUNTER — Telehealth: Payer: Self-pay | Admitting: Internal Medicine

## 2016-04-11 ENCOUNTER — Other Ambulatory Visit (INDEPENDENT_AMBULATORY_CARE_PROVIDER_SITE_OTHER): Payer: Medicare Other

## 2016-04-11 DIAGNOSIS — R1013 Epigastric pain: Secondary | ICD-10-CM | POA: Diagnosis not present

## 2016-04-11 DIAGNOSIS — R748 Abnormal levels of other serum enzymes: Secondary | ICD-10-CM

## 2016-04-11 LAB — HEPATIC FUNCTION PANEL
ALT: 12 U/L (ref 0–35)
AST: 10 U/L (ref 0–37)
Albumin: 3.8 g/dL (ref 3.5–5.2)
Alkaline Phosphatase: 95 U/L (ref 39–117)
Bilirubin, Direct: 0 mg/dL (ref 0.0–0.3)
Total Bilirubin: 0.3 mg/dL (ref 0.2–1.2)
Total Protein: 6.9 g/dL (ref 6.0–8.3)

## 2016-04-11 LAB — AMYLASE: Amylase: 32 U/L (ref 27–131)

## 2016-04-11 LAB — LIPASE: Lipase: 55 U/L (ref 11.0–59.0)

## 2016-04-11 NOTE — Telephone Encounter (Signed)
Patient need a refill of medication levothyroxine (SYNTHROID, LEVOTHROID) [facility] [address], [place] [phone] (Phone) [phone] (Fax)

## 2016-04-17 ENCOUNTER — Encounter: Payer: Self-pay | Admitting: Internal Medicine

## 2016-04-17 ENCOUNTER — Ambulatory Visit (INDEPENDENT_AMBULATORY_CARE_PROVIDER_SITE_OTHER): Payer: Medicare Other | Admitting: Internal Medicine

## 2016-04-17 VITALS — BP 122/76 | HR 78 | Ht 66.5 in | Wt 148.0 lb

## 2016-04-17 DIAGNOSIS — E039 Hypothyroidism, unspecified: Secondary | ICD-10-CM | POA: Diagnosis not present

## 2016-04-17 DIAGNOSIS — E1151 Type 2 diabetes mellitus with diabetic peripheral angiopathy without gangrene: Secondary | ICD-10-CM

## 2016-04-17 DIAGNOSIS — Z23 Encounter for immunization: Secondary | ICD-10-CM

## 2016-04-17 DIAGNOSIS — I208 Other forms of angina pectoris: Secondary | ICD-10-CM

## 2016-04-17 NOTE — Patient Instructions (Addendum)
Please continue Levothyroxine 150 mcg daily.  Take the thyroid hormone every day, with water, at least 30 minutes before breakfast, separated by at least 4 hours from: - acid reflux medications - calcium - iron - multivitamins  Please continue - Lantus 40 units at bedtime - Glipizide 5 mg 2x a day - Januvia 100 mg daily in am, before b'fast - Novolog (take this 10 min before a meal): 14-18 units before L and D    Breakfast Lunch Dinner Bedtime   Januvia 100 mg  1 tab     Glipizide 10 mg  1/2 tab  1/2 tab   Lantus     40 units   NovoLog   14 units for a smaller meal 18 units for a larger meal 14 units for a smaller meal 18 units for a larger meal    Please stop at the lab.  Please return in 1.5 months with your sugar log.

## 2016-04-17 NOTE — Progress Notes (Signed)
Patient ID: Sarah Payne, female   DOB: 21-Mar-1946, 70 y.o.   MRN: [id number]  HPI: Sarah Payne is a 70 y.o.-year-old female, returning for f/u for DM2, dx 2003, insulin-dependent since 2013, uncontrolled, with complications (CAD, sCHF, PAD, CKD, PN) and also complicated by dementia and medication noncompliance. She also has hypothyroidism, also uncontrolled. Last visit 2.5 mo ago.  Her sister died, her nephew killed himself and she had an AMI in summer 2017. She is still very depressed. She also has back pain.  Latest HbA1c was very high, at 11.2% >> noncompliance with meds!  DM2: Last hemoglobin A1c was: Lab Results  Component Value Date   HGBA1C 11.2 (H) 02/06/2016   HGBA1C 10.8 (H) 09/20/2015   HGBA1C 8.6 (A) 01/04/2015   Pt is on a regimen of: - Lantus 40 units at bedtime - Glipizide 5 mg 2x a day - Januvia 100 mg daily in am, before b'fast - Novolog (take this 10 min before a meal): 10-14 units before lunch 8-12 units before dinner   Breakfast Lunch Dinner Bedtime   Januvia 100 mg  1 tab     Glipizide 10 mg  1/2 tab  1/2 tab   Lantus     40 units (out of this for 1 week)  NovoLog   14 units (misses this) 14 units    She has been on NPH insulin in the past, which we were able to stop at last visit. Since then, she was started on Lantus and NovoLog by PCP.  Pt checks her sugars 1-2x a day and they are: - am: 145-288 >> 80-200 (max 286) >> 68-204 >> 78-204 >> 99-317 >> 160-277, 282 >> >> 140-379, 433 - 2h after b'fast: 156-267 >> ~100 >> 131, 135, 146, 149 >> 224, 355 >> 178 >> 340 - before lunch: 89-212 >> 159-185 >> 140-170 >> 78-178 >> 60-311 >> 217 >> n/c - 2h after lunch: 134-230 >> lowest 63-204 >> 60-189, most <140 >> 94-438 >> 79-166 >> 197-416 - before dinner: 64-219 >> 140-202 >> 98 >> 69-125 >> 68- 249 >> 72 >> 131-260 - 2h after dinner: 71, 305 >> ~100 >> 79-108 >> 370 >> 278 (300-400 in the hospital) >> n/c - bedtime: 91-155 >> 119-170 >> ~100 >> 110-149 (253)  >> see above >> 192 >> 382 - nighttime: 54, 58, 145 >> 270 >> n/c + lows. Lowest sugar was 54 >> 60 >> 72 >> 140; she has ? hypoglycemia awareness.  Highest sugar was 253 >> 398 >> 400s >> 433.  - + CKD, last BUN/creatinine:  Lab Results  Component Value Date   BUN 19 04/04/2016   CREATININE 1.06 (H) 04/04/2016  She is on Enalapril. - last set of lipids: Lab Results  Component Value Date   CHOL 339 (H) 09/20/2015   HDL 57.10 09/20/2015   LDLCALC 146 (H) 03/12/2015   LDLDIRECT 237.0 09/20/2015   TRIG 224.0 (H) 09/20/2015   CHOLHDL 6 09/20/2015  She is on Lipitor.She is on Zetia. She is on ASA 81. - last eye exam was in 2016. No DR. She had cataract sx. - + numbness and tingling in her feet.  Hypothyroidism: She is on levothyroxine 175 >> 150 g - takes it daily.  She takes levothyroxine: - in am, fasting - with water - Separated by more than 30 minutes from breakfast - On calcium, acid reflux medicines >> later in the day - No multivitamins - No iron   Latest TSH  low >> we decreased LT4 to 150: Lab Results  Component Value Date   TSH 0.24 (L) 02/06/2016   TSH 51.35 (H) 09/20/2015   TSH 0.52 06/18/2015   TSH 53.23 (H) 06/01/2015   TSH 40.79 (H) 03/12/2015   TSH 53.83 (H) 12/21/2014   TSH 60.34 (H) 08/16/2014   TSH > 100 08/02/2014   TSH 0.99 04/26/2014   TSH 0.06 (L) 11/22/2013   FREET4 2.16 (H) 02/06/2016   FREET4 0.27 (L) 09/20/2015   FREET4 0.57 (L) 03/12/2015   FREET4 0.21 (L) 12/21/2014   FREET4 0.30 (L) 08/16/2014   FREET4 1.03 04/26/2014   FREET4 1.58 11/22/2013   ROS: Constitutional: no more weight loss, no fatigue, no urination, no nocturia Eyes: no more blurry vision, no xerophthalmia ENT: no sore throat, no nodules palpated in throat, no dysphagia/odynophagia, no hoarseness Cardiovascular: no CP/SOB/no palpitations/leg swelling Respiratory: + cough/no SOB Gastrointestinal: no N/ V/D/C/heartburn Musculoskeletal: + both: muscle/joint aches Skin:  no rashes Neurological: no tremors/numbness/tingling/dizziness  I reviewed pt's medications, allergies, PMH, social hx, family hx, and changes were documented in the history of present illness. Otherwise, unchanged from my initial visit note.  PE: BP 122/76   Pulse 78   Ht 5' 6.5" (1.689 m)   Wt 148 lb (67.1 kg)   BMI 23.53 kg/m  Body mass index is 23.53 kg/m. Wt Readings from Last 3 Encounters:  04/17/16 148 lb (67.1 kg)  04/04/16 146 lb 1.9 oz (66.3 kg)  02/06/16 142 lb (64.4 kg)   Constitutional: normal weight, in NAD Eyes: PERRLA, EOMI, no exophthalmos ENT: moist mucous membranes, no thyromegaly, no cervical lymphadenopathy Cardiovascular: RRR, No MRG Respiratory: CTA B Gastrointestinal: abdomen soft, NT, ND, BS+ Musculoskeletal: no deformities, strength intact in all 4 Skin: moist, warm, no rashes, very dry skin Neurological: no tremor with outstretched hands, DTR normal in all 4  ASSESSMENT: 1. DM2, insulin-dependent, uncontrolled, with complications - med noncompliance (dementia) - CAD - had 3 AMI in 2011 >> 7 stents placed; new AMI 2016-01-26 after her sister's death - sCHF - PAD - CKD - PN  2. Hypothyroidism - Uncontrolled  PLAN:  1. Patient with long-standing uncontrolled diabetes, on oral + insulin antidiabetic regimen, with poor med compliance  >> needs compliance with meds!. I strongly advised her to take NovoLog before each meal but we will also increase the dose for larger meals.  - Latest HbA1c was very high, at 11.2%. - I suggested to:  Patient Instructions   Please continue Levothyroxine 150 mcg daily.  Take the thyroid hormone every day, with water, at least 30 minutes before breakfast, separated by at least 4 hours from: - acid reflux medications - calcium - iron - multivitamins  Please continue - Lantus 40 units at bedtime - Glipizide 5 mg 2x a day - Januvia 100 mg daily in am, before b'fast - Novolog (take this 10 min before a meal): 14-18  units before L and D    Breakfast Lunch Dinner Bedtime   Januvia 100 mg  1 tab     Glipizide 10 mg  1/2 tab  1/2 tab   Lantus     40 units   NovoLog   14 units for a smaller meal 18 units for a larger meal 14 units for a smaller meal 18 units for a larger meal    Please stop at the lab.  Please return in 1.5 months with your sugar log.   - continue checking sugars at different times of the  day - check 3 times a day, rotating checks - She is up to date with eye exams >> needs a new one  - will give her the flu shot today - Return to clinic in 1.5 mo with sugar log   2. Hypothyroidism - Patient's thyroid tests have been very uncontrolled, and this could be from poor medication compliance +/- taking her levothyroxine along with calcium and PPIs. - We discussed about taking levothyroxine correctly: Every day, fasting, separated by at least 30 minutes from breakfast, and separated by at least 4 hours from calcium, iron, multivitamins, acid reflux medicines. She is now taking it correctly- For now, will continue levothyroxine 150 g daily - I will recheck her TFTs today  Office Visit on 04/17/2016  Component Date Value Ref Range Status  . TSH 04/18/2016 0.36  0.35 - 4.50 uIU/mL Final  . Free T4 04/18/2016 1.21  0.60 - 1.60 ng/dL Final   Normal thyroid tests!  Philemon Kingdom, MD PhD [facility]

## 2016-04-18 LAB — TSH: TSH: 0.36 u[IU]/mL (ref 0.35–4.50)

## 2016-04-18 LAB — T4, FREE: Free T4: 1.21 ng/dL (ref 0.60–1.60)

## 2016-04-21 ENCOUNTER — Emergency Department
Admission: EM | Admit: 2016-04-21 | Discharge: 2016-04-21 | Disposition: A | Discharge status: Home / Self Care | Payer: Medicare Other | Attending: Emergency Medicine | Admitting: Emergency Medicine

## 2016-04-21 ENCOUNTER — Encounter: Payer: Self-pay | Admitting: Emergency Medicine

## 2016-04-21 DIAGNOSIS — J449 Chronic obstructive pulmonary disease, unspecified: Secondary | ICD-10-CM | POA: Insufficient documentation

## 2016-04-21 DIAGNOSIS — E039 Hypothyroidism, unspecified: Secondary | ICD-10-CM | POA: Diagnosis not present

## 2016-04-21 DIAGNOSIS — F1290 Cannabis use, unspecified, uncomplicated: Secondary | ICD-10-CM | POA: Diagnosis not present

## 2016-04-21 DIAGNOSIS — Z955 Presence of coronary angioplasty implant and graft: Secondary | ICD-10-CM | POA: Diagnosis not present

## 2016-04-21 DIAGNOSIS — Z7984 Long term (current) use of oral hypoglycemic drugs: Secondary | ICD-10-CM | POA: Diagnosis not present

## 2016-04-21 DIAGNOSIS — F17210 Nicotine dependence, cigarettes, uncomplicated: Secondary | ICD-10-CM | POA: Insufficient documentation

## 2016-04-21 DIAGNOSIS — I2510 Atherosclerotic heart disease of native coronary artery without angina pectoris: Secondary | ICD-10-CM | POA: Insufficient documentation

## 2016-04-21 DIAGNOSIS — Z7982 Long term (current) use of aspirin: Secondary | ICD-10-CM | POA: Insufficient documentation

## 2016-04-21 DIAGNOSIS — M545 Low back pain, unspecified: Secondary | ICD-10-CM

## 2016-04-21 DIAGNOSIS — E119 Type 2 diabetes mellitus without complications: Secondary | ICD-10-CM | POA: Diagnosis not present

## 2016-04-21 DIAGNOSIS — I10 Essential (primary) hypertension: Secondary | ICD-10-CM | POA: Diagnosis not present

## 2016-04-21 DIAGNOSIS — Z794 Long term (current) use of insulin: Secondary | ICD-10-CM | POA: Insufficient documentation

## 2016-04-21 DIAGNOSIS — Z79899 Other long term (current) drug therapy: Secondary | ICD-10-CM | POA: Diagnosis not present

## 2016-04-21 LAB — URINALYSIS COMPLETE WITH MICROSCOPIC (ARMC ONLY)
Bacteria, UA: NONE SEEN
Bilirubin Urine: NEGATIVE
Glucose, UA: >500 mg/dL — AB
Hgb urine dipstick: NEGATIVE
Ketones, ur: NEGATIVE mg/dL
Leukocytes, UA: NEGATIVE
Nitrite: NEGATIVE
Protein, ur: NEGATIVE mg/dL
RBC / HPF: NONE SEEN RBC/hpf (ref 0–5)
Specific Gravity, Urine: 1.017 (ref 1.005–1.030)
pH: 7 (ref 5.0–8.0)

## 2016-04-21 MED ORDER — OXYCODONE-ACETAMINOPHEN 5-325 MG PO TABS: 1.0000 | ORAL_TABLET | ORAL | 0 refills | Status: DC | PRN

## 2016-04-21 MED ORDER — KETOROLAC TROMETHAMINE 60 MG/2ML IM SOLN
60.0000 mg | Freq: Once | INTRAMUSCULAR | Status: AC
  Administered 2016-04-21: 60 mg via INTRAMUSCULAR
  Filled 2016-04-21: qty 2

## 2016-04-21 NOTE — ED Triage Notes (Signed)
Patient states she has had lumbar back pain X 2 weeks.  Denies injury.  Has been seen by "every one of my Doctors".  Placed on muscle relaxer with no relief.  Denies radiation of pain.  Patient states there "is a little bit of weakness in my legs".  Denies any difficulty with bowels or bladder.  Describes pain as "hard steady pain".

## 2016-04-21 NOTE — ED Notes (Addendum)
Pt presents with lower back pain x 2 weeks. Saw PCP but has had no relief from muscle relaxers prescribed then. She states she did not call back to PCP and decided to come here because pain was too bad. Pt states she had back surgery several years ago for same type of problem. Pt states her hips have begun to hurt as well. Pt alert & oriented with NAD noted.

## 2016-04-23 ENCOUNTER — Telehealth: Payer: Self-pay | Admitting: Family Medicine

## 2016-04-23 NOTE — Telephone Encounter (Signed)
Patient Name: Sarah Payne  DOB: 07-04-1946    Initial Comment Caller states she's on Tramodol, and Oxycodone. She went to ER for lower back pain, that goes to the stomach. She states it's past a 10. Severe pain.   Nurse Assessment  Nurse: Raphael Gibney, RN, Vera Date/Time (Eastern Time): 04/23/2016 12:51:18 PM  Confirm and document reason for call. If symptomatic, describe symptoms. You must click the next button to save text entered. ---Caller states she is on tramadol and oxycodone for back pain. Went to the ER on Sunday. She had spinal fusion 6 years ago. Pain is radiating from her back to her abd. Pain level 10+.  Has the patient traveled out of the country within the last 30 days? ---Not Applicable  Does the patient have any new or worsening symptoms? ---Yes  Will a triage be completed? ---Yes  Related visit to physician within the last 2 weeks? ---Yes  Does the PT have any chronic conditions? (i.e. diabetes, asthma, etc.) ---Yes  List chronic conditions. ---spinal fusion; MI x 7; diabetes; thyroid  Is this a behavioral health or substance abuse call? ---No     Guidelines    Guideline Title Affirmed Question Affirmed Notes  Back Pain Weakness of a leg or foot (e.g., unable to bear weight, dragging foot)    Final Disposition User   Go to ED Now (or PCP triage) Raphael Gibney, RN, Vera    Comments  Pt states she can not come into the office within 4 hrs as her spouse has a cardilogy appt today.  Called back line and spoke to [place] and gave report that pt is having severe back pain with triage outcome of go to ER now (or PCP triage) but states she can not go to the ER or come into the office within 4 hrs as her spouse has a cardology appt.   Referrals  GO TO FACILITY REFUSED   Disagree/Comply: Comply

## 2016-05-06 ENCOUNTER — Ambulatory Visit (INDEPENDENT_AMBULATORY_CARE_PROVIDER_SITE_OTHER): Payer: Medicare Other | Admitting: Family Medicine

## 2016-05-06 ENCOUNTER — Other Ambulatory Visit: Payer: Self-pay | Admitting: Family Medicine

## 2016-05-06 ENCOUNTER — Ambulatory Visit (INDEPENDENT_AMBULATORY_CARE_PROVIDER_SITE_OTHER)
Admission: RE | Admit: 2016-05-06 | Discharge: 2016-05-06 | Disposition: A | Discharge status: Home / Self Care | Payer: Medicare Other | Source: Ambulatory Visit | Attending: Family Medicine | Admitting: Family Medicine

## 2016-05-06 ENCOUNTER — Encounter: Payer: Self-pay | Admitting: Family Medicine

## 2016-05-06 VITALS — BP 168/102 | HR 98 | Temp 98.3°F | Wt 147.5 lb

## 2016-05-06 DIAGNOSIS — M5440 Lumbago with sciatica, unspecified side: Secondary | ICD-10-CM

## 2016-05-06 DIAGNOSIS — I208 Other forms of angina pectoris: Secondary | ICD-10-CM | POA: Diagnosis not present

## 2016-05-06 DIAGNOSIS — Z981 Arthrodesis status: Secondary | ICD-10-CM

## 2016-05-06 DIAGNOSIS — M545 Low back pain, unspecified: Secondary | ICD-10-CM | POA: Insufficient documentation

## 2016-05-06 NOTE — Progress Notes (Signed)
SUBJECTIVE:  Sarah Payne is a 70 y.o. female who complains of low back pain for 3 week(s), positional with bending or lifting, with radiation down the legs. Precipitating factors: none recalled by the patient.  There is no numbness in the legs.  Went to ER on 04/21/16 because pain was so severe.  Notes reviewed.  Appears she left prior to being seen.  Was given toradol IM injection.  She did not have much relief with this.  She is asking for narcotics today, very tearful.  She does have a h/o of prior back surgeries.  She has not contacted her neurosurgeon.  Current Outpatient Prescriptions on File Prior to Visit  Medication Sig Dispense Refill  . ALPRAZolam (XANAX) 0.25 MG tablet Take 1 tablet (0.25 mg total) by mouth 2 (two) times daily as needed for anxiety. 20 tablet 0  . aspirin 81 MG tablet Take 81 mg by mouth daily.      Marland Kitchen atorvastatin (LIPITOR) 80 MG tablet Take 1 tablet (80 mg total) by mouth daily. 90 tablet 3  . Calcium & Magnesium Carbonates (MYLANTA PO) Take 15 mLs by mouth as needed (for indegestion).     . carvedilol (COREG) 3.125 MG tablet Take 6.25 mg by mouth 2 (two) times daily with a meal.    . clopidogrel (PLAVIX) 75 MG tablet TAKE 1 TABLET (75 MG TOTAL) BY MOUTH DAILY. 90 tablet 3  . DULoxetine (CYMBALTA) 60 MG capsule TAKE 1 CAPSULE BY MOUTH DAILY. 90 capsule 3  . enalapril (VASOTEC) 10 MG tablet Take 1 tablet (10 mg total) by mouth daily. 30 tablet 3  . ezetimibe (ZETIA) 10 MG tablet Take 1 tablet (10 mg total) by mouth daily. 90 tablet 3  . glipiZIDE (GLUCOTROL) 10 MG tablet Take 5 mg Before breakfast and supper (Patient taking differently: Take 5 mg Before breakfast and supper) 180 tablet 1  . glucose blood (FREESTYLE LITE) test strip Check blood sugar 3 times daily to check blood sugars - Uncontrolled diabetes mellitus 300 each 3  . glucose monitoring kit (FREESTYLE) monitoring kit 1 each by Does not apply route as needed for other. PHARMACY PLEASE SUBSTITUTE  WHATEVER GLUCOMETER YOU CARRY 1 each 1  . insulin aspart (NOVOLOG FLEXPEN) 100 UNIT/ML FlexPen Inject 12-14 Units into the skin 3 (three) times daily with meals. Normally takes 12 units 15 mL 3  . Insulin Glargine (LANTUS SOLOSTAR) 100 UNIT/ML Solostar Pen Inject 40 Units into the skin at bedtime. 15 mL 3  . Insulin Pen Needle (INSUPEN PEN NEEDLES) 32G X 4 MM MISC Use to inject insulin 4 times daily. 250 each 1  . isosorbide mononitrate (IMDUR) 30 MG 24 hr tablet Take 30 mg by mouth daily.    . lansoprazole (PREVACID) 15 MG capsule Take 15 mg by mouth daily. Reported on 11/06/2015    . levothyroxine (SYNTHROID, LEVOTHROID) 150 MCG tablet Take 1 tablet (150 mcg total) by mouth daily. 45 tablet 1  . magnesium 30 MG tablet Take 30 mg by mouth daily. Reported on 09/20/2015    . methocarbamol (ROBAXIN) 500 MG tablet Take 1 tablet (500 mg total) by mouth every 8 (eight) hours as needed for muscle spasms. 15 tablet 0  . ondansetron (ZOFRAN) 4 MG tablet Take 1 tab every 6 hours as needed for nausea. 30 tablet 2  . oxyCODONE-acetaminophen (ROXICET) 5-325 MG tablet Take 1 tablet by mouth every 4 (four) hours as needed for severe pain. 20 tablet 0  . pantoprazole (PROTONIX) 40 MG  tablet Take 1 tablet (40 mg total) by mouth daily. 90 tablet 3  . sitaGLIPtin (JANUVIA) 100 MG tablet Take 1 tablet (100 mg total) by mouth daily. 90 tablet 3  . traMADol (ULTRAM) 50 MG tablet Take 1 tablet (50 mg total) by mouth every 6 (six) hours as needed. 12 tablet 0  . traZODone (DESYREL) 100 MG tablet TAKE 1 TABLET (100 MG TOTAL) BY MOUTH AT BEDTIME. (Patient taking differently: Take 200 mg by mouth at bedtime. ) 90 tablet 3   No current facility-administered medications on file prior to visit.     Allergies  Allergen Reactions  . No Known Allergies     Past Medical History:  Diagnosis Date  . Cataract    BILATERAL REMOVED  . Chronic low back pain   . COPD (chronic obstructive pulmonary disease) ([place])   . Coronary  artery disease    a. multiple PCIs to the mLCx. b. stent to dRCA 08/2011 in setting of NSTEMI. c. DES to prox-mid LCx for ISR 09/2011. d.  DESx2 to prox-mRCA 07/2012. e. Takotsubo event 12/2015 with patent stents.  . Depression   . Diabetes mellitus   . Diverticulosis   . Frequent PVCs    a. Noted in hospital 12/2015.  Marland Kitchen GERD (gastroesophageal reflux disease)   . Hyperlipidemia   . Hypertension   . Hypothyroidism   . Impingement syndrome of left shoulder   . Marijuana abuse   . Myocardial infarction    x 5  . Sleep apnea    mild-does not use cpap  . Takotsubo cardiomyopathy    a. 12/2015 - nephew committed suicide 1 week prior, sister died the morning of presentation - initially called a STEMI; cath with patent stents. LVEF 25-30%.  . Tendonitis of left rotator cuff   . Tobacco abuse   . Vascular dementia     Past Surgical History:  Procedure Laterality Date  . ABDOMINAL HYSTERECTOMY    . APPENDECTOMY    . BACK SURGERY    . CARDIAC CATHETERIZATION  2013  . CARDIAC CATHETERIZATION  2016  . CARDIAC CATHETERIZATION N/A 01/01/2016   Procedure: Left Heart Cath and Coronary Angiography;  Surgeon: Jettie Booze, MD;  Location: [facility];  Service: Cardiovascular;  Laterality: N/A;  . CATARACT EXTRACTION W/PHACO Left 01/30/2015   Procedure: CATARACT EXTRACTION PHACO AND INTRAOCULAR LENS PLACEMENT (IOC);  Surgeon: Birder Robson, MD;  Location: [facility];  Service: Ophthalmology;  Laterality: Left;  [place] 00:47   . CATARACT EXTRACTION W/PHACO Right 02/13/2015   Procedure: CATARACT EXTRACTION PHACO AND INTRAOCULAR LENS PLACEMENT (IOC);  Surgeon: Birder Robson, MD;  Location: [facility];  Service: Ophthalmology;  Laterality: Right;  cassette lot # [id number] H [place]  00:29.9 AP  20.7 CDE  6.20  . COLONOSCOPY N/A 11/02/2014   Procedure: COLONOSCOPY;  Surgeon: Inda Castle, MD;  Location: [facility];  Service: Endoscopy;  Laterality: N/A;  . CORONARY ANGIOPLASTY  2012   stent x 3   .  CORONARY ANGIOPLASTY WITH STENT PLACEMENT  2013    Family History  Problem Relation Age of Onset  . Heart attack Mother     First MI @ 67 - Died @ 33  . Heart disease Mother   . Heart disease Father     Died @ 37  . Throat cancer Brother   . Liver cancer Brother   . Colon cancer Sister     Social History   Social History  . Marital status: Married  Spouse name: N/A  . Number of children: N/A  . Years of education: N/A   Occupational History  . Not on file.   Social History Main Topics  . Smoking status: Current Every Day Smoker    Packs/day: 1.00    Years: 45.00    Types: Cigarettes  . Smokeless tobacco: Never Used     Comment: Has cut back, trying to quit.   . Alcohol use No  . Drug use:     Types: Marijuana     Comment: last night 4.13.16  . Sexual activity: Not on file   Other Topics Concern  . Not on file   Social History Narrative   Lives at home with her husband in [place].  Previously used marijuana - quit.      Regular exercise: no/ pain from a frozen rotator cuff   Caffeine use: coffee daily and pepsi      Does not have a living will.   Daughters and husband know her wishes- would desire CPR but not prolonged life support if futile   The PMH, PSH, Social History, Family History, Medications, and allergies have been reviewed in [facility], and have been updated if relevant.  OBJECTIVE: BP (!) 168/102   Pulse 98   Temp 98.3 F (36.8 C) (Oral)   Wt 147 lb 8 oz (66.9 kg)   SpO2 97%   BMI 23.45 kg/m   BP (!) 168/102   Pulse 98   Temp 98.3 F (36.8 C) (Oral)   Wt 147 lb 8 oz (66.9 kg)   SpO2 97%   BMI 23.45 kg/m   Patient appears to be in mild to moderate pain, antalgic gait noted. Lumbosacral spine area reveals no local tenderness or mass.  Painful and reduced LS ROM noted. Straight leg raise is negative at 45 degrees on bilateral. DTR's, motor strength and sensation normal, including heel and toe gait.  Peripheral pulses are palpable. X-Ray:  ordered, but results not yet available.  ASSESSMENT:  Severe back pain  PLAN: Xray today, will likely need MRI. She is very tearful, asking for narcotics which I did not prescribe.  I did advise that she call her neurosurgeon as soon as possible.  For acute pain, rest, intermittent application of heat (do not sleep on heating pad), analgesics and muscle relaxants are recommended. Discussed longer term treatment plan of prn NSAID's and discussed a home back care exercise program with flexion exercise routine. Proper lifting with avoidance of heavy lifting discussed.

## 2016-05-06 NOTE — Progress Notes (Signed)
Pre visit review using our clinic review tool, if applicable. No additional management support is needed unless otherwise documented below in the visit note. 

## 2016-05-07 MED ORDER — OXYCODONE-ACETAMINOPHEN 5-325 MG PO TABS: 1.0000 | ORAL_TABLET | ORAL | 0 refills | Status: DC | PRN

## 2016-05-07 NOTE — Progress Notes (Signed)
Spoke to pt and informed her Rx is available for pickup from the front desk 

## 2016-05-09 ENCOUNTER — Telehealth: Payer: Self-pay | Admitting: Internal Medicine

## 2016-05-09 ENCOUNTER — Other Ambulatory Visit: Payer: Self-pay

## 2016-05-09 MED ORDER — INSULIN GLARGINE 100 UNIT/ML SOLOSTAR PEN: 40.0000 [IU] | PEN_INJECTOR | Freq: Every day | SUBCUTANEOUS | 3 refills | Status: DC

## 2016-05-09 NOTE — Telephone Encounter (Signed)
Pt needs her Lantus refilled and sent to [facility].

## 2016-05-09 NOTE — ED Provider Notes (Signed)
[facility] Emergency Department Provider Note    First MD Initiated Contact with Patient 04/21/16 1234     (approximate)  I have reviewed the triage vital signs and the nursing notes.   HISTORY  Chief Complaint Back Pain   HPI Sarah Payne is a 70 y.o. female with history of nontraumatic low back pain 2 weeks. Patient denies any lower extremity weakness numbness or gait instability. Patient states that she saw her PCP for this and was prescribed muscle relaxants without improvement of pain. She states that her current pain score is 9 out of 10.   Past Medical History:  Diagnosis Date  . Cataract    BILATERAL REMOVED  . Chronic low back pain   . COPD (chronic obstructive pulmonary disease) ([place])   . Coronary artery disease    a. multiple PCIs to the mLCx. b. stent to dRCA 08/2011 in setting of NSTEMI. c. DES to prox-mid LCx for ISR 09/2011. d.  DESx2 to prox-mRCA 07/2012. e. Takotsubo event 12/2015 with patent stents.  . Depression   . Diabetes mellitus   . Diverticulosis   . Frequent PVCs    a. Noted in hospital 12/2015.  Marland Kitchen GERD (gastroesophageal reflux disease)   . Hyperlipidemia   . Hypertension   . Hypothyroidism   . Impingement syndrome of left shoulder   . Marijuana abuse   . Myocardial infarction    x 5  . Sleep apnea    mild-does not use cpap  . Takotsubo cardiomyopathy    a. 12/2015 - nephew committed suicide 1 week prior, sister died the morning of presentation - initially called a STEMI; cath with patent stents. LVEF 25-30%.  . Tendonitis of left rotator cuff   . Tobacco abuse   . Vascular dementia     Patient Active Problem List   Diagnosis Date Noted  . Lumbar pain 05/06/2016  . Diarrhea 02/05/2016  . RLQ abdominal pain 02/05/2016  . Takotsubo cardiomyopathy 01/02/2016  . Frequent PVCs 01/02/2016  . Diabetes mellitus with circulatory complication ([place]) 64/33/2951  . Acute stress reaction 01/02/2016  . Acute systolic  (congestive) heart failure 01/01/2016  . Emesis 10/12/2015  . HTN (hypertension) 09/20/2015  . Benign neoplasm of colon 11/02/2014  . Family history of colon cancer 11/02/2014  . Internal hemorrhoids 11/02/2014  . Adjustment disorder with mixed anxiety and depressed mood 08/16/2014  . Esophageal reflux 05/23/2014  . Diverticulosis of colon without hemorrhage 05/17/2014  . COPD, moderate ([place]) 03/29/2013  . Noncompliance with diabetes treatment 02/27/2013  . Obstructive sleep apnea 02/25/2013  . Chronic systolic congestive heart failure ([place]) 05/21/2012  . Lichen sclerosus et atrophicus 05/20/2012  . Bronchitis, chronic obstructive ([place]) 05/20/2012  . 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 History of lumbar discectomy 06/29/2011  . Depression 06/24/2011  . Hypothyroidism 05/03/2011  . CAD, multiple vessel 02/24/2011  . Tobacco abuse 02/24/2011  . Hyperlipidemia 02/24/2011    Past Surgical History:  Procedure Laterality Date  . ABDOMINAL HYSTERECTOMY    . APPENDECTOMY    . BACK SURGERY    . CARDIAC CATHETERIZATION  2013  . CARDIAC CATHETERIZATION  2016  . CARDIAC CATHETERIZATION N/A 01/01/2016   Procedure: Left Heart Cath and Coronary Angiography;  Surgeon: Jettie Booze, MD;  Location: [place] [facility];  Service: Cardiovascular;  Laterality: N/A;  . CATARACT EXTRACTION W/PHACO Left 01/30/2015   Procedure: CATARACT EXTRACTION PHACO AND INTRAOCULAR LENS PLACEMENT (IOC);  Surgeon: Gwyndolyn Saxon  Porfilio, MD;  Location: [facility];  Service: Ophthalmology;  Laterality: Left;  [place] 00:47   . CATARACT EXTRACTION W/PHACO Right 02/13/2015   Procedure: CATARACT EXTRACTION PHACO AND INTRAOCULAR LENS PLACEMENT (IOC);  Surgeon: Birder Robson, MD;  Location: [facility];  Service: Ophthalmology;  Laterality: Right;  cassette lot # [id number] H [place]  00:29.9 AP  20.7 CDE  6.20  . COLONOSCOPY N/A 11/02/2014   Procedure: COLONOSCOPY;  Surgeon: Inda Castle, MD;   Location: [place];  Service: Endoscopy;  Laterality: N/A;  . CORONARY ANGIOPLASTY  2012   stent x 3   . CORONARY ANGIOPLASTY WITH STENT PLACEMENT  2013    Prior to Admission medications   Medication Sig Start Date End Date Taking? Authorizing Provider  ALPRAZolam (XANAX) 0.25 MG tablet Take 1 tablet (0.25 mg total) by mouth 2 (two) times daily as needed for anxiety. 01/02/16   Dayna N Dunn, PA-C  aspirin 81 MG tablet Take 81 mg by mouth daily.      Historical Provider, MD  atorvastatin (LIPITOR) 80 MG tablet Take 1 tablet (80 mg total) by mouth daily. 08/08/15   Minna Merritts, MD  Calcium & Magnesium Carbonates (MYLANTA PO) Take 15 mLs by mouth as needed (for indegestion).     Historical Provider, MD  carvedilol (COREG) 3.125 MG tablet Take 6.25 mg by mouth 2 (two) times daily with a meal.    Historical Provider, MD  clopidogrel (PLAVIX) 75 MG tablet TAKE 1 TABLET (75 MG TOTAL) BY MOUTH DAILY. 04/26/14   Lucille Passy, MD  DULoxetine (CYMBALTA) 60 MG capsule TAKE 1 CAPSULE BY MOUTH DAILY. 12/03/15   Lucille Passy, MD  enalapril (VASOTEC) 10 MG tablet Take 1 tablet (10 mg total) by mouth daily. 01/07/16   Rise Mu, PA-C  ezetimibe (ZETIA) 10 MG tablet Take 1 tablet (10 mg total) by mouth daily. 08/08/15   Minna Merritts, MD  glipiZIDE (GLUCOTROL) 10 MG tablet Take 5 mg Before breakfast and supper Patient taking differently: Take 5 mg Before breakfast and supper 01/16/16   Philemon Kingdom, MD  glucose blood (FREESTYLE LITE) test strip Check blood sugar 3 times daily to check blood sugars - Uncontrolled diabetes mellitus 05/18/13   Philemon Kingdom, MD  glucose monitoring kit (FREESTYLE) monitoring kit 1 each by Does not apply route as needed for other. PHARMACY PLEASE SUBSTITUTE WHATEVER GLUCOMETER YOU CARRY 06/02/12   Crecencio Mc, MD  insulin aspart (NOVOLOG FLEXPEN) 100 UNIT/ML FlexPen Inject 12-14 Units into the skin 3 (three) times daily with meals. Normally takes 12 units 01/08/16    Philemon Kingdom, MD  Insulin Glargine (LANTUS SOLOSTAR) 100 UNIT/ML Solostar Pen Inject 40 Units into the skin at bedtime. 01/08/16   Philemon Kingdom, MD  Insulin Pen Needle (INSUPEN PEN NEEDLES) 32G X 4 MM MISC Use to inject insulin 4 times daily. 01/08/16   Philemon Kingdom, MD  isosorbide mononitrate (IMDUR) 30 MG 24 hr tablet Take 30 mg by mouth daily.    Historical Provider, MD  lansoprazole (PREVACID) 15 MG capsule Take 15 mg by mouth daily. Reported on 11/06/2015    Historical Provider, MD  levothyroxine (SYNTHROID, LEVOTHROID) 150 MCG tablet Take 1 tablet (150 mcg total) by mouth daily. 02/06/16   Philemon Kingdom, MD  magnesium 30 MG tablet Take 30 mg by mouth daily. Reported on 09/20/2015    Historical Provider, MD  methocarbamol (ROBAXIN) 500 MG tablet Take 1 tablet (500 mg total) by mouth every 8 (eight) hours as  needed for muscle spasms. 04/04/16   [name], NP  ondansetron (ZOFRAN) 4 MG tablet Take 1 tab every 6 hours as needed for nausea. 10/24/15   Laban Emperor Zehr, PA-C  oxyCODONE-acetaminophen (ROXICET) 5-325 MG tablet Take 1 tablet by mouth every 4 (four) hours as needed for severe pain. 05/07/16   Lucille Passy, MD  pantoprazole (PROTONIX) 40 MG tablet Take 1 tablet (40 mg total) by mouth daily. 10/12/15   Jessica D Zehr, PA-C  sitaGLIPtin (JANUVIA) 100 MG tablet Take 1 tablet (100 mg total) by mouth daily. 04/26/14   Lucille Passy, MD  traMADol (ULTRAM) 50 MG tablet Take 1 tablet (50 mg total) by mouth every 6 (six) hours as needed. 01/09/16 01/08/17  Nance Pear, MD  traZODone (DESYREL) 100 MG tablet TAKE 1 TABLET (100 MG TOTAL) BY MOUTH AT BEDTIME. Patient taking differently: Take 200 mg by mouth at bedtime.  12/03/15   Lucille Passy, MD    Allergies No known allergies  Family History  Problem Relation Age of Onset  . Heart attack Mother     First MI @ 41 - Died @ 20  . Heart disease Mother   . Heart disease Father     Died @ 27  . Throat cancer Brother   . Liver cancer  Brother   . Colon cancer Sister     Social History Social History  Substance Use Topics  . Smoking status: Current Every Day Smoker    Packs/day: 1.00    Years: 45.00    Types: Cigarettes  . Smokeless tobacco: Never Used     Comment: Has cut back, trying to quit.   . Alcohol use No    Review of Systems Constitutional: No fever/chills Eyes: No visual changes. ENT: No sore throat. Cardiovascular: Denies chest pain. Respiratory: Denies shortness of breath. Gastrointestinal: No abdominal pain.  No nausea, no vomiting.  No diarrhea.  No constipation. Genitourinary: Negative for dysuria. Musculoskeletal: Positive for back pain. Skin: Negative for rash. Neurological: Negative for headaches, focal weakness or numbness.  10-point ROS otherwise negative.  ____________________________________________   PHYSICAL EXAM:  VITAL SIGNS: ED Triage Vitals  Enc Vitals Group     BP 04/21/16 1047 138/76     Pulse Rate 04/21/16 1047 79     Resp 04/21/16 1047 20     Temp 04/21/16 1047 98.3 F (36.8 C)     Temp Source 04/21/16 1047 Oral     SpO2 04/21/16 1047 96 %     Weight 04/21/16 1048 145 lb (65.8 kg)     Height 04/21/16 1048 5' 6.5" (1.689 m)     Head Circumference --      Peak Flow --      Pain Score 04/21/16 1049 10     Pain Loc --      Pain Edu? --      Excl. in [place]? --     Constitutional: Alert and oriented. Well appearing and in no acute distress. Eyes: Conjunctivae are normal. PERRL. EOMI. Head: Atraumatic. Mouth/Throat: Mucous membranes are moist.  Oropharynx non-erythematous. Cardiovascular: Normal rate, regular rhythm. Good peripheral circulation. Grossly normal heart sounds. Respiratory: Normal respiratory effort.  No retractions. Lungs CTAB. Gastrointestinal: Soft and nontender. No distention.  Musculoskeletal: No lower extremity tenderness nor edema. No gross deformities of extremities. Neurologic:  Normal speech and language. No gross focal neurologic deficits  are appreciated.  Skin:  Skin is warm, dry and intact. No rash noted. Psychiatric: Mood and  affect are normal. Speech and behavior are normal.  ____________________________________________   LABS (all labs ordered are listed, but only abnormal results are displayed)  Labs Reviewed  URINALYSIS COMPLETEWITH MICROSCOPIC ([place]) - Abnormal; Notable for the following:       Result Value   Color, Urine STRAW (*)    APPearance CLEAR (*)    Glucose, UA >500 (*)    Squamous Epithelial / LPF 0-5 (*)    All other components within normal limits     Procedures      INITIAL IMPRESSION / ASSESSMENT AND PLAN / ED COURSE  Pertinent labs & imaging results that were available during my care of the patient were reviewed by me and considered in my medical decision making (see chart for details).  Patient received his 60 mg IM Toradol emergency Department with improvement of pain   Clinical Course    ____________________________________________  FINAL CLINICAL IMPRESSION(S) / ED DIAGNOSES  Final diagnoses:  Acute bilateral low back pain without sciatica     MEDICATIONS GIVEN DURING THIS VISIT:  Medications  ketorolac (TORADOL) injection 60 mg (60 mg Intramuscular Given 04/21/16 1335)     NEW OUTPATIENT MEDICATIONS STARTED DURING THIS VISIT:  Discharge Medication List as of 04/21/2016  2:03 PM      Discharge Medication List as of 04/21/2016  2:03 PM      Discharge Medication List as of 04/21/2016  2:03 PM       Note:  This document was prepared using Dragon voice recognition software and may include unintentional dictation errors.    Gregor Hams, MD 05/12/16 [phone]

## 2016-05-15 DIAGNOSIS — M961 Postlaminectomy syndrome, not elsewhere classified: Secondary | ICD-10-CM | POA: Diagnosis not present

## 2016-05-23 ENCOUNTER — Ambulatory Visit (INDEPENDENT_AMBULATORY_CARE_PROVIDER_SITE_OTHER): Payer: Medicare Other | Admitting: Cardiovascular Disease

## 2016-05-23 ENCOUNTER — Encounter: Payer: Self-pay | Admitting: Cardiovascular Disease

## 2016-05-23 VITALS — BP 130/70 | HR 70 | Ht 66.5 in | Wt 151.5 lb

## 2016-05-23 DIAGNOSIS — M546 Pain in thoracic spine: Secondary | ICD-10-CM

## 2016-05-23 DIAGNOSIS — I208 Other forms of angina pectoris: Secondary | ICD-10-CM | POA: Diagnosis not present

## 2016-05-23 DIAGNOSIS — E7800 Pure hypercholesterolemia, unspecified: Secondary | ICD-10-CM | POA: Diagnosis not present

## 2016-05-23 DIAGNOSIS — I5022 Chronic systolic (congestive) heart failure: Secondary | ICD-10-CM | POA: Diagnosis not present

## 2016-05-23 DIAGNOSIS — I10 Essential (primary) hypertension: Secondary | ICD-10-CM

## 2016-05-23 DIAGNOSIS — E1151 Type 2 diabetes mellitus with diabetic peripheral angiopathy without gangrene: Secondary | ICD-10-CM | POA: Diagnosis not present

## 2016-05-23 DIAGNOSIS — G8929 Other chronic pain: Secondary | ICD-10-CM

## 2016-05-23 DIAGNOSIS — I2510 Atherosclerotic heart disease of native coronary artery without angina pectoris: Secondary | ICD-10-CM

## 2016-05-23 NOTE — Patient Instructions (Addendum)
Medication Instructions:   No medication changes made  Labwork:  No new labs needed  Testing/Procedures:  No further testing at this time   Follow-Up: It was a pleasure seeing you in the office today. Please call us if you have new issues that need to be addressed before your next appt.  [phone]  Your physician wants you to follow-up in: 6 months.  You will receive a reminder letter in the mail two months in advance. If you don't receive a letter, please call our office to schedule the follow-up appointment.  If you need a refill on your cardiac medications before your next appointment, please call your pharmacy.    Recheck in

## 2016-05-23 NOTE — Progress Notes (Signed)
Cardiology Office Note  Date:  05/23/2016   ID:  Sarah Payne, DOB 12-17-1945, MRN [id number]  PCP:  Arnette Norris, MD   Chief Complaint  Patient presents with  . other    6 month follow up. Meds reviewed by the pt. verbally. "doing well."     HPI:  70 year old woman with a long history of smoking, coronary artery disease, prior stent x3 placed to the mid left circumflex with moderate to severe LAD disease , stent placed to her distal RCA in February 2013, stent to the mid left circumflex in March 2013 , poorly controlled diabetes with hemoglobin A1c of 10, depression, hypertension, cardiac catheterization 08/19/2012 showing moderate mid LAD disease at the takeoff of the diagonal vessel, severe ostial to mid RCA disease, ejection fraction greater than 55%. She had FFR pressure wire showing severe disease of the RCA with drug-eluting stent x2 placed. C. difficile late 2015 after antibiotics Previous history of running out of her medications She presents today for follow-up of her coronary artery disease,  Recent hospitalization at [facility], cardiac catheterization performed showing patent stents Postop procedure complication of groin hematoma  She presented to  01/01/2016, significant stress in the family Troponin elevation, felt to be acute STEMI, cardiac catheterization confirming patent stents Felt to be stress related cardiomyopathy as ejection fraction was depressed Echocardiogram June 2017, ejection fraction 25-30% She had follow-up echocardiogram   September 2017 showing normal LV function  On today's visit, she reports having severe back pain, Has an appt next Wednesday with neurosurgery.  Was told she might need a nerve stimulator  Limited in her ability to move, exert herself Has had previous cortisone shots to her back Reports she is scheduled to see primary care for routine physical and lab work  Denies any cardiac issues, no shortness of breath, no chest pain on  exertion Previous cardiac workup reviewed with her  Other past medical history reviewed Continues to have problems with her diabetes, hemoglobin A1c of 11 compliant on Lipitor 40 mg daily.  in the hospital January 12, discharged 08/02/2014. Diagnosed with unstable angina. Had cardiac catheterization showing severe stable coronary disease, Echocardiogram with normal ejection fraction Total cholesterol 292  Since her discharge, she denies any further chest pain. She reports that she is taking her medications. Recently been to diabetes instruction classes, scheduled to start insulin. She has been compliant with her statin per the patient She has developed a cough since her discharge from the hospital. She does not want antibiotics given prior history of C. Difficile at the end of 2015  Details of the echocardiogram 08/01/2014; ejection fraction 55-60%, normal right ventricular systolic pressure, normal right heart systolic function Catheterization details, unchanged from prior catheterization. Lab report has not been signed off in the computer. Catheterization by Dr. Ellyn Hack. Medical management was recommended  Previous Lab work showing hemoglobin A1c 8.8, total cholesterol 287, LDL 174  Echocardiogram February 07, 2011 shows normal LV systolic function, diastolic dysfunction, moderate inferior wall hypokinesis, normal right ventricular systolic pressures.  PMH:   has a past medical history of Cataract; Chronic low back pain; COPD (chronic obstructive pulmonary disease) ([place]); Coronary artery disease; Depression; Diabetes mellitus; Diverticulosis; Frequent PVCs; GERD (gastroesophageal reflux disease); Hyperlipidemia; Hypertension; Hypothyroidism; Impingement syndrome of left shoulder; Marijuana abuse; Myocardial infarction; Sleep apnea; Takotsubo cardiomyopathy; Tendonitis of left rotator cuff; Tobacco abuse; and Vascular dementia.  PSH:    Past Surgical History:  Procedure Laterality Date   . ABDOMINAL HYSTERECTOMY    .  APPENDECTOMY    . BACK SURGERY    . CARDIAC CATHETERIZATION  2013  . CARDIAC CATHETERIZATION  2016  . CARDIAC CATHETERIZATION N/A 01/01/2016   Procedure: Left Heart Cath and Coronary Angiography;  Surgeon: Jettie Booze, MD;  Location: [place] [facility];  Service: Cardiovascular;  Laterality: N/A;  . CATARACT EXTRACTION W/PHACO Left 01/30/2015   Procedure: CATARACT EXTRACTION PHACO AND INTRAOCULAR LENS PLACEMENT (IOC);  Surgeon: Birder Robson, MD;  Location: [facility];  Service: Ophthalmology;  Laterality: Left;  [place] 00:47   . CATARACT EXTRACTION W/PHACO Right 02/13/2015   Procedure: CATARACT EXTRACTION PHACO AND INTRAOCULAR LENS PLACEMENT (IOC);  Surgeon: Birder Robson, MD;  Location: [facility];  Service: Ophthalmology;  Laterality: Right;  cassette lot # [id number] H [place]  00:29.9 AP  20.7 CDE  6.20  . COLONOSCOPY N/A 11/02/2014   Procedure: COLONOSCOPY;  Surgeon: Inda Castle, MD;  Location: [place];  Service: Endoscopy;  Laterality: N/A;  . CORONARY ANGIOPLASTY  2012   stent x 3   . CORONARY ANGIOPLASTY WITH STENT PLACEMENT  2013    Current Outpatient Prescriptions  Medication Sig Dispense Refill  . ALPRAZolam (XANAX) 0.25 MG tablet Take 1 tablet (0.25 mg total) by mouth 2 (two) times daily as needed for anxiety. 20 tablet 0  . aspirin 81 MG tablet Take 81 mg by mouth daily.      Marland Kitchen atorvastatin (LIPITOR) 80 MG tablet Take 1 tablet (80 mg total) by mouth daily. 90 tablet 3  . Calcium & Magnesium Carbonates (MYLANTA PO) Take 15 mLs by mouth as needed (for indegestion).     . carvedilol (COREG) 3.125 MG tablet Take 6.25 mg by mouth 2 (two) times daily with a meal.    . clopidogrel (PLAVIX) 75 MG tablet TAKE 1 TABLET (75 MG TOTAL) BY MOUTH DAILY. 90 tablet 3  . DULoxetine (CYMBALTA) 60 MG capsule TAKE 1 CAPSULE BY MOUTH DAILY. 90 capsule 3  . enalapril (VASOTEC) 10 MG tablet Take 1 tablet (10 mg total) by mouth daily. 30 tablet 3  . ezetimibe  (ZETIA) 10 MG tablet Take 1 tablet (10 mg total) by mouth daily. 90 tablet 3  . glipiZIDE (GLUCOTROL) 10 MG tablet Take 5 mg Before breakfast and supper (Patient taking differently: Take 5 mg Before breakfast and supper) 180 tablet 1  . glucose blood (FREESTYLE LITE) test strip Check blood sugar 3 times daily to check blood sugars - Uncontrolled diabetes mellitus 300 each 3  . glucose monitoring kit (FREESTYLE) monitoring kit 1 each by Does not apply route as needed for other. PHARMACY PLEASE SUBSTITUTE WHATEVER GLUCOMETER YOU CARRY 1 each 1  . insulin aspart (NOVOLOG FLEXPEN) 100 UNIT/ML FlexPen Inject 12-14 Units into the skin 3 (three) times daily with meals. Normally takes 12 units 15 mL 3  . Insulin Glargine (LANTUS SOLOSTAR) 100 UNIT/ML Solostar Pen Inject 40 Units into the skin at bedtime. 15 mL 3  . Insulin Pen Needle (INSUPEN PEN NEEDLES) 32G X 4 MM MISC Use to inject insulin 4 times daily. 250 each 1  . isosorbide mononitrate (IMDUR) 30 MG 24 hr tablet Take 30 mg by mouth daily.    . lansoprazole (PREVACID) 15 MG capsule Take 15 mg by mouth daily. Reported on 11/06/2015    . levothyroxine (SYNTHROID, LEVOTHROID) 150 MCG tablet Take 1 tablet (150 mcg total) by mouth daily. 45 tablet 1  . magnesium 30 MG tablet Take 30 mg by mouth daily. Reported on 09/20/2015    . methocarbamol (  ROBAXIN) 500 MG tablet Take 1 tablet (500 mg total) by mouth every 8 (eight) hours as needed for muscle spasms. 15 tablet 0  . ondansetron (ZOFRAN) 4 MG tablet Take 1 tab every 6 hours as needed for nausea. 30 tablet 2  . oxyCODONE-acetaminophen (ROXICET) 5-325 MG tablet Take 1 tablet by mouth every 4 (four) hours as needed for severe pain. 20 tablet 0  . pantoprazole (PROTONIX) 40 MG tablet Take 1 tablet (40 mg total) by mouth daily. 90 tablet 3  . sitaGLIPtin (JANUVIA) 100 MG tablet Take 1 tablet (100 mg total) by mouth daily. 90 tablet 3  . traMADol (ULTRAM) 50 MG tablet Take 1 tablet (50 mg total) by mouth every 6  (six) hours as needed. 12 tablet 0  . traZODone (DESYREL) 100 MG tablet TAKE 1 TABLET (100 MG TOTAL) BY MOUTH AT BEDTIME. (Patient taking differently: Take 200 mg by mouth at bedtime. ) 90 tablet 3   No current facility-administered medications for this visit.      Allergies:   No known allergies   Social History:  The patient  reports that she has been smoking Cigarettes.  She has a 45.00 pack-year smoking history. She has never used smokeless tobacco. She reports that she uses drugs, including Marijuana. She reports that she does not drink alcohol.   Family History:   family history includes Colon cancer in her sister; Heart attack in her mother; Heart disease in her father and mother; Liver cancer in her brother; Throat cancer in her brother.    Review of Systems: Review of Systems  Constitutional: Negative.   Respiratory: Negative.   Cardiovascular: Negative.   Gastrointestinal: Negative.   Musculoskeletal: Positive for back pain.  Neurological: Negative.   Psychiatric/Behavioral: Negative.   All other systems reviewed and are negative.    PHYSICAL EXAM: VS:  BP 130/70 (BP Location: Left Arm, Patient Position: Sitting, Cuff Size: Normal)   Pulse 70   Ht 5' 6.5" (1.689 m)   Wt 151 lb 8 oz (68.7 kg)   BMI 24.09 kg/m  , BMI Body mass index is 24.09 kg/m. GEN: Well nourished, well developed, in no acute distress  HEENT: normal  Neck: no JVD, carotid bruits, or masses Cardiac: RRR; no murmurs, rubs, or gallops,no edema  Respiratory:  clear to auscultation bilaterally, normal work of breathing GI: soft, nontender, nondistended, + BS MS: no deformity or atrophy  Skin: warm and dry, no rash Neuro:  Strength and sensation are intact Psych: euthymic mood, full affect    Recent Labs: 04/04/2016: BUN 19; Creat 1.06; Hemoglobin 13.1; Platelets 267; Potassium 4.2; Sodium 139 04/11/2016: ALT 12 04/17/2016: TSH 0.36    Lipid Panel Lab Results  Component Value Date   CHOL 339  (H) 09/20/2015   HDL 57.10 09/20/2015   LDLCALC 146 (H) 03/12/2015   TRIG 224.0 (H) 09/20/2015      Wt Readings from Last 3 Encounters:  05/23/16 151 lb 8 oz (68.7 kg)  05/06/16 147 lb 8 oz (66.9 kg)  04/21/16 145 lb (65.8 kg)       ASSESSMENT AND PLAN:  CAD, multiple vessel - Plan: EKG 12-Lead Currently with no symptoms of angina. No further workup at this time. Continue current medication regimen.  Chronic systolic congestive heart failure (HCC) - Plan: EKG 12-Lead Recent echocardiogram with normal ejection fraction We'll continue carvedilol, enalapril, Imdur  Pure hypercholesterolemia She reports lab work to be done through primary care at routine physical  Diabetes mellitus type 2  with peripheral artery disease ([place]) Long discussion concerning her diabetes control She reports has follow-up with primary care and endocrine  Essential hypertension Blood pressure is well controlled on today's visit. No changes made to the medications.  Back pain Long discussion concerning her chronic back pain   scheduled for Dr. visits next week, possibly to discuss nerve stimulator   Total encounter time more than 25 minutes  Greater than 50% was spent in counseling and coordination of care with the patient   Disposition:   F/U  6 months   Orders Placed This Encounter  Procedures  . EKG 12-Lead     Signed, Esmond Plants, M.D., Ph.D. 05/23/2016  [place], [place]

## 2016-05-30 ENCOUNTER — Ambulatory Visit: Payer: Medicare Other

## 2016-06-02 ENCOUNTER — Ambulatory Visit (INDEPENDENT_AMBULATORY_CARE_PROVIDER_SITE_OTHER): Payer: Medicare Other | Admitting: Family Medicine

## 2016-06-02 ENCOUNTER — Encounter: Payer: Self-pay | Admitting: Family Medicine

## 2016-06-02 VITALS — HR 69 | Temp 98.0°F | Wt 149.5 lb

## 2016-06-02 DIAGNOSIS — F329 Major depressive disorder, single episode, unspecified: Secondary | ICD-10-CM

## 2016-06-02 DIAGNOSIS — E1151 Type 2 diabetes mellitus with diabetic peripheral angiopathy without gangrene: Secondary | ICD-10-CM

## 2016-06-02 DIAGNOSIS — Z9889 Other specified postprocedural states: Secondary | ICD-10-CM

## 2016-06-02 DIAGNOSIS — Z9119 Patient's noncompliance with other medical treatment and regimen: Secondary | ICD-10-CM

## 2016-06-02 DIAGNOSIS — E7800 Pure hypercholesterolemia, unspecified: Secondary | ICD-10-CM | POA: Diagnosis not present

## 2016-06-02 DIAGNOSIS — Z0000 Encounter for general adult medical examination without abnormal findings: Secondary | ICD-10-CM

## 2016-06-02 DIAGNOSIS — J449 Chronic obstructive pulmonary disease, unspecified: Secondary | ICD-10-CM

## 2016-06-02 DIAGNOSIS — E1351 Other specified diabetes mellitus with diabetic peripheral angiopathy without gangrene: Secondary | ICD-10-CM

## 2016-06-02 DIAGNOSIS — F32A Depression, unspecified: Secondary | ICD-10-CM

## 2016-06-02 DIAGNOSIS — I5022 Chronic systolic (congestive) heart failure: Secondary | ICD-10-CM | POA: Diagnosis not present

## 2016-06-02 DIAGNOSIS — Z720 Tobacco use: Secondary | ICD-10-CM

## 2016-06-02 DIAGNOSIS — I208 Other forms of angina pectoris: Secondary | ICD-10-CM | POA: Diagnosis not present

## 2016-06-02 DIAGNOSIS — I10 Essential (primary) hypertension: Secondary | ICD-10-CM

## 2016-06-02 DIAGNOSIS — Z91199 Patient's noncompliance with other medical treatment and regimen due to unspecified reason: Secondary | ICD-10-CM

## 2016-06-02 DIAGNOSIS — I2510 Atherosclerotic heart disease of native coronary artery without angina pectoris: Secondary | ICD-10-CM | POA: Diagnosis not present

## 2016-06-02 DIAGNOSIS — M545 Low back pain: Secondary | ICD-10-CM

## 2016-06-02 DIAGNOSIS — E038 Other specified hypothyroidism: Secondary | ICD-10-CM

## 2016-06-02 LAB — COMPREHENSIVE METABOLIC PANEL
ALT: 15 U/L (ref 0–35)
AST: 15 U/L (ref 0–37)
Albumin: 4.3 g/dL (ref 3.5–5.2)
Alkaline Phosphatase: 110 U/L (ref 39–117)
BUN: 15 mg/dL (ref 6–23)
CO2: 33 mEq/L — ABNORMAL HIGH (ref 19–32)
Calcium: 10.2 mg/dL (ref 8.4–10.5)
Chloride: 96 mEq/L (ref 96–112)
Creatinine, Ser: 1.04 mg/dL (ref 0.40–1.20)
GFR: 55.97 mL/min — ABNORMAL LOW (ref >60.00)
Glucose, Bld: 306 mg/dL — ABNORMAL HIGH (ref 70–99)
Potassium: 4.2 mEq/L (ref 3.5–5.1)
Sodium: 136 mEq/L (ref 135–145)
Total Bilirubin: 0.4 mg/dL (ref 0.2–1.2)
Total Protein: 7.3 g/dL (ref 6.0–8.3)

## 2016-06-02 LAB — CBC WITH DIFFERENTIAL/PLATELET
Basophils Absolute: 0 10*3/uL (ref 0.0–0.1)
Basophils Relative: 0.3 % (ref 0.0–3.0)
Eosinophils Absolute: 0.1 10*3/uL (ref 0.0–0.7)
Eosinophils Relative: 1.4 % (ref 0.0–5.0)
HCT: 43.7 % (ref 36.0–46.0)
Hemoglobin: 14.7 g/dL (ref 12.0–15.0)
Lymphocytes Relative: 25.6 % (ref 12.0–46.0)
Lymphs Abs: 2.5 10*3/uL (ref 0.7–4.0)
MCHC: 33.7 g/dL (ref 30.0–36.0)
MCV: 91.9 fl (ref 78.0–100.0)
Monocytes Absolute: 0.6 10*3/uL (ref 0.1–1.0)
Monocytes Relative: 5.9 % (ref 3.0–12.0)
Neutro Abs: 6.5 10*3/uL (ref 1.4–7.7)
Neutrophils Relative %: 66.8 % (ref 43.0–77.0)
Platelets: 289 10*3/uL (ref 150.0–400.0)
RBC: 4.75 Mil/uL (ref 3.87–5.11)
RDW: 14.6 % (ref 11.5–15.5)
WBC: 9.8 10*3/uL (ref 4.0–10.5)

## 2016-06-02 LAB — LIPID PANEL
Cholesterol: 301 mg/dL — ABNORMAL HIGH (ref 0–200)
HDL: 45.1 mg/dL (ref >39.00)
NonHDL: 255.58
Total CHOL/HDL Ratio: 7
Triglycerides: 387 mg/dL — ABNORMAL HIGH (ref 0.0–149.0)
VLDL: 77.4 mg/dL — ABNORMAL HIGH (ref 0.0–40.0)

## 2016-06-02 LAB — LDL CHOLESTEROL, DIRECT: Direct LDL: 207 mg/dL

## 2016-06-02 LAB — HEMOGLOBIN A1C: Hgb A1c MFr Bld: 11.7 % — ABNORMAL HIGH (ref 4.6–6.5)

## 2016-06-02 NOTE — Assessment & Plan Note (Signed)
S/p NSTEMI, CATH, stents On ASA, plavix, beta blocker, statin. Followed by cardiology.

## 2016-06-02 NOTE — Assessment & Plan Note (Signed)
Continue current rx. Followed by endo.

## 2016-06-02 NOTE — Assessment & Plan Note (Signed)
Followed by neurosurgery.  Has another appt this week. ? Possible nerve stimulator placement.

## 2016-06-02 NOTE — Assessment & Plan Note (Addendum)
With history of noncompliance. Remains poorly controlled. Followed by endo. Has appt scheduled for next week.

## 2016-06-02 NOTE — Patient Instructions (Signed)
Great to see you.  We will call you with your results and you can view them online. 

## 2016-06-02 NOTE — Assessment & Plan Note (Signed)
Followed by cardiology. Normotensive today. No changes made to rxs.

## 2016-06-02 NOTE — Assessment & Plan Note (Signed)
The patients weight, height, BMI and visual acuity have been recorded in the chart.  Cognitive function assessed.   I have made referrals, counseling and provided education to the patient based review of the above and I have provided the pt with a written personalized care plan for preventive services.  

## 2016-06-02 NOTE — Progress Notes (Addendum)
Subjective:   Patient ID: Sarah Payne, female    DOB: 1946/03/19, 70 y.o.   MRN: [id number]  Sarah Payne is a pleasant 70 y.o. year old female who presents to clinic today with Annual Exam (Medicare)  and follow up of chronic medical conditions on 06/02/2016  HPI:  I have personally reviewed the Medicare Annual Wellness questionnaire and have noted 1. The patient's medical and social history 2. Their use of alcohol, tobacco or illicit drugs 3. Their current medications and supplements 4. The patient's functional ability including ADL's, fall risks, home safety risks and hearing or visual             impairment. 5. Diet and physical activities 6. Evidence for depression or mood disorders  End of life wishes discussed and updated in Social History.  The roster of all physicians providing medical care to patient - is listed in the CareTeams section of the chart.   Severe back pain- referred her to see neurosurgery.  She has had severe low back pain for more than 6 weeks.  History of fusion 2008. Pain radiates to right hip and groin/lower abdomen, sometimes radiates to both thighs.  Saw Dr. Izora Ribas on 05/15/16- note reviewed.  Has follow up scheduled two days from now (06/04/16). ? Nerve stimulator as treatment option  Remote h/o hysterectomy Colonoscopy 11/02/14 ? Mammogram- she is refusing these at this time. Pneumovax 03/28/13 Prevnar 13 04/26/14 Influenza vaccine 04/17/16  DM- followed by Dr. Cruzita Lederer. Last seen on 04/17/16.  Note reviewed. Remains poorly controlled due to non compliance. Advised to increase dose of novolog, continue januvia, glipizide and lantus. Lab Results  Component Value Date   HGBA1C 11.2 (H) 02/06/2016   Hypothyroidism- also followed by Dr. Cruzita Lederer, endo. Lab Results  Component Value Date   TSH 0.36 04/17/2016   Note reviewed from 04/17/16.   CAD, HTN, CHF- h/o stents, MI- followed by Dr. Rockey Situ.  Last saw him on 05/23/16- note  reviewed. Advised to continue carvedilol, enalapril, Imdur and also encouraged compliance with rxs.  Lab Results  Component Value Date   CREATININE 1.06 (H) 04/04/2016    Does take statin when she remembers. Lab Results  Component Value Date   CHOL 339 (H) 09/20/2015   HDL 57.10 09/20/2015   LDLCALC 146 (H) 03/12/2015   LDLDIRECT 237.0 09/20/2015   TRIG 224.0 (H) 09/20/2015   CHOLHDL 6 09/20/2015   Lab Results  Component Value Date   ALT 12 04/11/2016   AST 10 04/11/2016   ALKPHOS 95 04/11/2016   BILITOT 0.3 04/11/2016    Current Outpatient Prescriptions on File Prior to Visit  Medication Sig Dispense Refill  . ALPRAZolam (XANAX) 0.25 MG tablet Take 1 tablet (0.25 mg total) by mouth 2 (two) times daily as needed for anxiety. 20 tablet 0  . aspirin 81 MG tablet Take 81 mg by mouth daily.      Marland Kitchen atorvastatin (LIPITOR) 80 MG tablet Take 1 tablet (80 mg total) by mouth daily. 90 tablet 3  . Calcium & Magnesium Carbonates (MYLANTA PO) Take 15 mLs by mouth as needed (for indegestion).     . carvedilol (COREG) 3.125 MG tablet Take 6.25 mg by mouth 2 (two) times daily with a meal.    . clopidogrel (PLAVIX) 75 MG tablet TAKE 1 TABLET (75 MG TOTAL) BY MOUTH DAILY. 90 tablet 3  . enalapril (VASOTEC) 10 MG tablet Take 1 tablet (10 mg total) by mouth daily. 30 tablet 3  . ezetimibe (  ZETIA) 10 MG tablet Take 1 tablet (10 mg total) by mouth daily. 90 tablet 3  . glipiZIDE (GLUCOTROL) 10 MG tablet Take 5 mg Before breakfast and supper (Patient taking differently: Take 5 mg Before breakfast and supper) 180 tablet 1  . glucose blood (FREESTYLE LITE) test strip Check blood sugar 3 times daily to check blood sugars - Uncontrolled diabetes mellitus 300 each 3  . glucose monitoring kit (FREESTYLE) monitoring kit 1 each by Does not apply route as needed for other. PHARMACY PLEASE SUBSTITUTE WHATEVER GLUCOMETER YOU CARRY 1 each 1  . insulin aspart (NOVOLOG FLEXPEN) 100 UNIT/ML FlexPen Inject 12-14  Units into the skin 3 (three) times daily with meals. Normally takes 12 units 15 mL 3  . Insulin Glargine (LANTUS SOLOSTAR) 100 UNIT/ML Solostar Pen Inject 40 Units into the skin at bedtime. 15 mL 3  . Insulin Pen Needle (INSUPEN PEN NEEDLES) 32G X 4 MM MISC Use to inject insulin 4 times daily. 250 each 1  . isosorbide mononitrate (IMDUR) 30 MG 24 hr tablet Take 30 mg by mouth daily.    . lansoprazole (PREVACID) 15 MG capsule Take 15 mg by mouth daily. Reported on 11/06/2015    . levothyroxine (SYNTHROID, LEVOTHROID) 150 MCG tablet Take 1 tablet (150 mcg total) by mouth daily. 45 tablet 1  . magnesium 30 MG tablet Take 30 mg by mouth daily. Reported on 09/20/2015    . methocarbamol (ROBAXIN) 500 MG tablet Take 1 tablet (500 mg total) by mouth every 8 (eight) hours as needed for muscle spasms. 15 tablet 0  . ondansetron (ZOFRAN) 4 MG tablet Take 1 tab every 6 hours as needed for nausea. 30 tablet 2  . oxyCODONE-acetaminophen (ROXICET) 5-325 MG tablet Take 1 tablet by mouth every 4 (four) hours as needed for severe pain. 20 tablet 0  . pantoprazole (PROTONIX) 40 MG tablet Take 1 tablet (40 mg total) by mouth daily. 90 tablet 3  . sitaGLIPtin (JANUVIA) 100 MG tablet Take 1 tablet (100 mg total) by mouth daily. 90 tablet 3  . traMADol (ULTRAM) 50 MG tablet Take 1 tablet (50 mg total) by mouth every 6 (six) hours as needed. 12 tablet 0  . traZODone (DESYREL) 100 MG tablet TAKE 1 TABLET (100 MG TOTAL) BY MOUTH AT BEDTIME. (Patient taking differently: Take 200 mg by mouth at bedtime. ) 90 tablet 3   No current facility-administered medications on file prior to visit.     Allergies  Allergen Reactions  . No Known Allergies     Past Medical History:  Diagnosis Date  . Cataract    BILATERAL REMOVED  . Chronic low back pain   . COPD (chronic obstructive pulmonary disease) ([place])   . Coronary artery disease    a. multiple PCIs to the mLCx. b. stent to dRCA 08/2011 in setting of NSTEMI. c. DES to  prox-mid LCx for ISR 09/2011. d.  DESx2 to prox-mRCA 07/2012. e. Takotsubo event 12/2015 with patent stents.  . Depression   . Diabetes mellitus   . Diverticulosis   . Frequent PVCs    a. Noted in hospital 12/2015.  Marland Kitchen GERD (gastroesophageal reflux disease)   . Hyperlipidemia   . Hypertension   . Hypothyroidism   . Impingement syndrome of left shoulder   . Marijuana abuse   . Myocardial infarction    x 5  . Sleep apnea    mild-does not use cpap  . Takotsubo cardiomyopathy    a. 12/2015 - nephew committed suicide  1 week prior, sister died the morning of presentation - initially called a STEMI; cath with patent stents. LVEF 25-30%.  . Tendonitis of left rotator cuff   . Tobacco abuse   . Vascular dementia     Past Surgical History:  Procedure Laterality Date  . ABDOMINAL HYSTERECTOMY    . APPENDECTOMY    . BACK SURGERY    . CARDIAC CATHETERIZATION  2013  . CARDIAC CATHETERIZATION  2016  . CARDIAC CATHETERIZATION N/A 01/01/2016   Procedure: Left Heart Cath and Coronary Angiography;  Surgeon: Jettie Booze, MD;  Location: [facility];  Service: Cardiovascular;  Laterality: N/A;  . CATARACT EXTRACTION W/PHACO Left 01/30/2015   Procedure: CATARACT EXTRACTION PHACO AND INTRAOCULAR LENS PLACEMENT (IOC);  Surgeon: Birder Robson, MD;  Location: [facility];  Service: Ophthalmology;  Laterality: Left;  [place] 00:47   . CATARACT EXTRACTION W/PHACO Right 02/13/2015   Procedure: CATARACT EXTRACTION PHACO AND INTRAOCULAR LENS PLACEMENT (IOC);  Surgeon: Birder Robson, MD;  Location: [facility];  Service: Ophthalmology;  Laterality: Right;  cassette lot # [id number] H [place]  00:29.9 AP  20.7 CDE  6.20  . COLONOSCOPY N/A 11/02/2014   Procedure: COLONOSCOPY;  Surgeon: Inda Castle, MD;  Location: [facility];  Service: Endoscopy;  Laterality: N/A;  . CORONARY ANGIOPLASTY  2012   stent x 3   . CORONARY ANGIOPLASTY WITH STENT PLACEMENT  2013    Family History  Problem Relation Age of Onset  .  Heart attack Mother     First MI @ 14 - Died @ 34  . Heart disease Mother   . Heart disease Father     Died @ 63  . Throat cancer Brother   . Liver cancer Brother   . Colon cancer Sister     Social History   Social History  . Marital status: Married    Spouse name: N/A  . Number of children: N/A  . Years of education: N/A   Occupational History  . Not on file.   Social History Main Topics  . Smoking status: Current Every Day Smoker    Packs/day: 1.00    Years: 45.00    Types: Cigarettes  . Smokeless tobacco: Never Used     Comment: Has cut back, trying to quit.   . Alcohol use No  . Drug use:     Types: Marijuana     Comment: last night 4.13.16  . Sexual activity: Not on file   Other Topics Concern  . Not on file   Social History Narrative   Lives at home with her husband in [place].  Previously used marijuana - quit.      Regular exercise: no/ pain from a frozen rotator cuff   Caffeine use: coffee daily and pepsi      Does not have a living will.   Daughters and husband know her wishes- would desire CPR but not prolonged life support if futile   The PMH, PSH, Social History, Family History, Medications, and allergies have been reviewed in [facility], and have been updated if relevant.      Review of Systems  Constitutional: Negative.   HENT: Negative.   Eyes: Negative.   Respiratory: Negative.   Cardiovascular: Negative.   Gastrointestinal: Positive for diarrhea and nausea. Negative for rectal pain.  Endocrine: Negative.   Genitourinary: Negative.   Musculoskeletal: Positive for back pain.  Skin: Negative.   Allergic/Immunologic: Negative.   Hematological: Negative.   Psychiatric/Behavioral: Negative.   All other  systems reviewed and are negative.      Objective:    Pulse 69   Temp 98 F (36.7 C) (Oral)   Wt 149 lb 8 oz (67.8 kg)   SpO2 96%   BMI 23.77 kg/m    Physical Exam  Constitutional: She is oriented to person, place, and time. She  appears well-developed and well-nourished. No distress.  HENT:  Head: Normocephalic.  Eyes: Conjunctivae are normal.  Neck: Normal range of motion. No thyromegaly present.  Cardiovascular: Normal rate and regular rhythm.   Pulmonary/Chest: Effort normal and breath sounds normal.  Abdominal: Soft.  Musculoskeletal: Normal range of motion.  Neurological: She is alert and oriented to person, place, and time. No cranial nerve deficit.  Skin: Skin is warm and dry. She is not diaphoretic.  Psychiatric: She has a normal mood and affect. Her behavior is normal. Judgment and thought content normal.  Nursing note and vitals reviewed.         Assessment & Plan:   Medicare annual wellness visit, subsequent - Plan: CBC with Differential/Platelet  CAD, multiple vessel  Chronic systolic congestive heart failure (HCC)  COPD, moderate (HCC)  Depression, unspecified depression type  Diabetes mellitus type 2 with peripheral artery disease ([place]) - Plan: Hemoglobin A1c  Essential hypertension  Pure hypercholesterolemia - Plan: Comprehensive metabolic panel, Lipid panel  Other specified hypothyroidism  Tobacco abuse  Noncompliance with diabetes treatment  Low back pain, unspecified back pain laterality, unspecified chronicity, with sciatica presence unspecified  History of lumbar discectomy  Other specified diabetes mellitus with diabetic peripheral angiopathy without gangrene, unspecified long term insulin use status ([place]) No Follow-up on file.

## 2016-06-02 NOTE — Progress Notes (Signed)
Pre visit review using our clinic review tool, if applicable. No additional management support is needed unless otherwise documented below in the visit note. 

## 2016-06-02 NOTE — Assessment & Plan Note (Signed)
Due for repeat lipid panel today. No changes made to rxs. Stressed importance of compliance.

## 2016-06-04 DIAGNOSIS — G894 Chronic pain syndrome: Secondary | ICD-10-CM | POA: Diagnosis not present

## 2016-06-04 DIAGNOSIS — M5416 Radiculopathy, lumbar region: Secondary | ICD-10-CM | POA: Diagnosis not present

## 2016-06-06 ENCOUNTER — Telehealth: Payer: Self-pay | Admitting: Cardiovascular Disease

## 2016-06-06 ENCOUNTER — Other Ambulatory Visit: Payer: Self-pay

## 2016-06-06 ENCOUNTER — Other Ambulatory Visit: Payer: Self-pay | Admitting: Internal Medicine

## 2016-06-06 DIAGNOSIS — E1151 Type 2 diabetes mellitus with diabetic peripheral angiopathy without gangrene: Secondary | ICD-10-CM

## 2016-06-06 MED ORDER — INSULIN GLARGINE 100 UNIT/ML SOLOSTAR PEN: 40.0000 [IU] | PEN_INJECTOR | Freq: Every day | SUBCUTANEOUS | 3 refills | Status: DC

## 2016-06-06 MED ORDER — ATORVASTATIN CALCIUM 80 MG PO TABS: 80.0000 mg | ORAL_TABLET | Freq: Every day | ORAL | 3 refills | Status: DC

## 2016-06-06 MED ORDER — CARVEDILOL 3.125 MG PO TABS: 6.2500 mg | ORAL_TABLET | Freq: Two times a day (BID) | ORAL | 3 refills | Status: DC

## 2016-06-06 MED ORDER — INSULIN ASPART 100 UNIT/ML FLEXPEN: 12.0000 [IU] | PEN_INJECTOR | Freq: Three times a day (TID) | SUBCUTANEOUS | 3 refills | Status: DC

## 2016-06-06 MED ORDER — ISOSORBIDE MONONITRATE ER 30 MG PO TB24: 30.0000 mg | ORAL_TABLET | Freq: Every day | ORAL | 3 refills | Status: DC

## 2016-06-06 MED ORDER — CLOPIDOGREL BISULFATE 75 MG PO TABS: ORAL_TABLET | ORAL | 3 refills | Status: DC

## 2016-06-06 MED ORDER — PANTOPRAZOLE SODIUM 40 MG PO TBEC: 40.0000 mg | DELAYED_RELEASE_TABLET | Freq: Every day | ORAL | 3 refills | Status: DC

## 2016-06-06 MED ORDER — GLIPIZIDE 10 MG PO TABS
ORAL_TABLET | ORAL | 1 refills | Status: DC
Start: 2016-06-06 — End: 2016-08-14

## 2016-06-06 MED ORDER — ENALAPRIL MALEATE 10 MG PO TABS: 10.0000 mg | ORAL_TABLET | Freq: Every day | ORAL | 3 refills | Status: DC

## 2016-06-06 NOTE — Telephone Encounter (Signed)
Patient need a refill of all her diabetic medication  Send to  [place], [place] [address] [phone] (Phone) [phone] (Fax)

## 2016-06-06 NOTE — Telephone Encounter (Signed)
Sent!

## 2016-06-06 NOTE — Telephone Encounter (Signed)
°*  STAT* If patient is at the pharmacy, call can be transferred to refill team.   1. Which medications need to be refilled? (please list name of each medication and dose if known)  Isosorbide  Lisinopril  Ond Protonix  SirFLIPTIN  Carvedilol Clopidogrel  Atorvastatin ENALAPRIL  2. Which pharmacy/location (including street and city if local pharmacy) is medication to be sent to? [facility] on [address]  3. Do they need a 30 day or 90 day supply? 90 day

## 2016-06-10 NOTE — Telephone Encounter (Signed)
Lantus sent 06/06/16.  Levothyroxine rx is pending, ok to refill?

## 2016-06-10 NOTE — Telephone Encounter (Signed)
Pt needs her Lantus and her Thyroid medication refilled and sent to [facility] on [address].

## 2016-06-11 MED ORDER — LEVOTHYROXINE SODIUM 150 MCG PO TABS: 150.0000 ug | ORAL_TABLET | Freq: Every day | ORAL | 1 refills | Status: DC

## 2016-06-11 NOTE — Telephone Encounter (Signed)
thanks

## 2016-06-11 NOTE — Telephone Encounter (Signed)
Refill submitted. 

## 2016-06-19 ENCOUNTER — Ambulatory Visit: Payer: Medicare Other | Admitting: Internal Medicine

## 2016-06-23 ENCOUNTER — Ambulatory Visit (INDEPENDENT_AMBULATORY_CARE_PROVIDER_SITE_OTHER): Payer: Medicare Other | Admitting: Internal Medicine

## 2016-06-23 ENCOUNTER — Encounter: Payer: Self-pay | Admitting: Internal Medicine

## 2016-06-23 VITALS — BP 142/80 | HR 73 | Wt 151.0 lb

## 2016-06-23 DIAGNOSIS — E039 Hypothyroidism, unspecified: Secondary | ICD-10-CM

## 2016-06-23 DIAGNOSIS — I208 Other forms of angina pectoris: Secondary | ICD-10-CM

## 2016-06-23 DIAGNOSIS — E1151 Type 2 diabetes mellitus with diabetic peripheral angiopathy without gangrene: Secondary | ICD-10-CM

## 2016-06-23 MED ORDER — INSULIN GLARGINE 100 UNIT/ML SOLOSTAR PEN: 40.0000 [IU] | PEN_INJECTOR | Freq: Every day | SUBCUTANEOUS | 3 refills | Status: DC

## 2016-06-23 NOTE — Progress Notes (Signed)
Patient ID: Sarah Payne, female   DOB: [birth year], 70 y.o.   MRN: [id number]  HPI: Sarah Payne is a 70 y.o.-year-old female, returning for f/u for DM2, dx 2003, insulin-dependent since 2013, uncontrolled, with complications (CAD, sCHF, PAD, CKD, PN) and also complicated by dementia and medication noncompliance. She also has hypothyroidism, also uncontrolled. Last visit 2.5 mo ago.  She again feels very stressed: Her sister died, her nephew killed himself and she had an AMI in summer 2017. She also has back pain >> now on pain meds started 2 weeks ago >> better. She will have back surgery.  DM2: Last hemoglobin A1c remains very high: Lab Results  Component Value Date   HGBA1C 11.7 (H) 06/02/2016   HGBA1C 11.2 (H) 02/06/2016   HGBA1C 10.8 (H) 09/20/2015   Pt is on a regimen of: - Lantus 40 units at bedtime - Glipizide 5 mg 2x a day - Januvia 100 mg daily in am, before b'fast - Novolog (take this 10 min before a meal):   Breakfast Lunch Dinner Bedtime   Januvia 100 mg  1 tab     Glipizide 10 mg  1/2 tab  1/2 tab   Lantus     40 units   NovoLog   14 units for a smaller meal 18 units for a larger meal 14 units for a smaller meal 18 units for a larger meal     Pt checks her sugars 1-2x a day and they are extremely fluctuating: - am:  68-204 >> 78-204 >> 99-317 >> 160-277, 282 >> >> 140-379, 433 >> 82, 129, 182-345 - 2h after b'fast: 156-267 >> ~100 >> 131, 135, 146, 149 >> 224, 355 >> 178 >> 340 >> n/c - before lunch: 89-212 >> 159-185 >> 140-170 >> 78-178 >> 60-311 >> 217 >> n/c >> 172-334 - 2h after lunch: lowest 63-204 >> 60-189, most <140 >> 94-438 >> 79-166 >> 197-416 >> 74, 81, 163-299 - before dinner: 64-219 >> 140-202 >> 98 >> 69-125 >> 68- 249 >> 72 >> 131-260 >> 68, 169-278 - 2h after dinner: 71, 305 >> ~100 >> 79-108 >> 370 >> 278 (300-400 in the hospital) >> n/c >> 77-277 - bedtime: 91-155 >> 119-170 >> ~100 >> 110-149 (253) >> see above >> 192 >> 382 >> 273-352 -  nighttime: 54, 58, 145 >> 270 >> n/c + lows. Lowest sugar was 54 >> 60 >> 72 >> 140 >> 68; she has ? hypoglycemia awareness.  Highest sugar was 253 >> 398 >> 400s >> 433 >> 352.  - + CKD, last BUN/creatinine:  Lab Results  Component Value Date   BUN 15 06/02/2016   CREATININE 1.04 06/02/2016  She is on Enalapril. - last set of lipids: Lab Results  Component Value Date   CHOL 301 (H) 06/02/2016   HDL 45.10 06/02/2016   LDLCALC 146 (H) 03/12/2015   LDLDIRECT 207.0 06/02/2016   TRIG 387.0 (H) 06/02/2016   CHOLHDL 7 06/02/2016  She is on Lipitor.She is on Zetia. She is on ASA 81. - last eye exam was in 2016. No DR. She had cataract sx. - + numbness and tingling in her feet.  Hypothyroidism: She is on levothyroxine 150 g - takes it daily.  She takes levothyroxine: - in am, fasting - with water - Separated by more than 30 minutes from breakfast - On calcium, acid reflux medicines >> later in the day - No multivitamins - No iron   Latest TSH: Lab Results  Component Value Date   TSH 0.36 04/17/2016   TSH 0.24 (L) 02/06/2016   TSH 51.35 (H) 09/20/2015   TSH 0.52 06/18/2015   TSH 53.23 (H) 06/01/2015   TSH 40.79 (H) 03/12/2015   TSH 53.83 (H) 12/21/2014   TSH 60.34 (H) 08/16/2014   TSH > 100 08/02/2014   TSH 0.99 04/26/2014   FREET4 1.21 04/17/2016   FREET4 2.16 (H) 02/06/2016   FREET4 0.27 (L) 09/20/2015   FREET4 0.57 (L) 03/12/2015   FREET4 0.21 (L) 12/21/2014   FREET4 0.30 (L) 08/16/2014   FREET4 1.03 04/26/2014   FREET4 1.58 11/22/2013   ROS: Constitutional: no weight loss/gain, no fatigue, no urination, no nocturia Eyes: no more blurry vision, no xerophthalmia ENT: no sore throat, no nodules neck, no dysphagia/odynophagia, no hoarseness Cardiovascular: no CP/SOB/no palpitations/leg swelling Respiratory: + cough/no SOB Gastrointestinal: no N/ V/D/C/heartburn Musculoskeletal: + both: muscle/joint aches Skin: no rashes Neurological: no  tremors/numbness/tingling/dizziness  I reviewed pt's medications, allergies, PMH, social hx, family hx, and changes were documented in the history of present illness. Otherwise, unchanged from my initial visit note.  PE: BP (!) 142/80   Pulse 73   Wt 151 lb (68.5 kg)   BMI 24.01 kg/m  Body mass index is 24.01 kg/m. Wt Readings from Last 3 Encounters:  06/23/16 151 lb (68.5 kg)  06/02/16 149 lb 8 oz (67.8 kg)  05/23/16 151 lb 8 oz (68.7 kg)   Constitutional: normal weight, in NAD Eyes: PERRLA, EOMI, no exophthalmos ENT: moist mucous membranes, no thyromegaly, no cervical lymphadenopathy Cardiovascular: RRR, No MRG Respiratory: CTA B Gastrointestinal: abdomen soft, NT, ND, BS+ Musculoskeletal: no deformities, strength intact in all 4 Skin: moist, warm, no rashes, very dry skin Neurological: no tremor with outstretched hands, DTR normal in all 4  ASSESSMENT: 1. DM2, insulin-dependent, uncontrolled, with complications - med noncompliance (dementia) - CAD - had 3 AMI in 2011 >> 7 stents placed; new AMI 02/20/16 after her sister's death - sCHF - PAD - CKD - PN  2. Hypothyroidism - Uncontrolled  PLAN:  1. Patient with long-standing uncontrolled diabetes, on oral + insulin antidiabetic regimen, with poor med compliance. She tells me she is now compliant with most doses, however, her sugars are very variable, with mostly hyperglycemia but also several lows. Because of the low blood sugars, we cannot increase her insulin doses. We discussed about trying to eliminate the lows and then start to increase her insulin doses. We'll stop her glipizide today In the meantime, she needs to work on compliance. I will also advised her to keep food and activity log for to 2 weeks before her next visit. - Latest HbA1c was very high, at 11.2%. - I suggested to:  Patient Instructions  Please stop Glipizide.  Please continue: - Lantus 40 units at bedtime - Januvia 100 mg daily in am, before  b'fast - Novolog (take this 10 min before a meal): 14-18 units before L and D    Breakfast Lunch Dinner Bedtime   Januvia 100 mg  1 tab     Lantus     40 units   NovoLog   14 units for a smaller meal 18 units for a larger meal 14 units for a smaller meal 18 units for a larger meal    Please start a food and activity log for 2 weeks before next visit. Marland Kitchen  Please return in 1.5 months with your sugar log.    - continue checking sugars at different times of the  day - check 3 times a day, rotating checks - She needs a new eye exam - We gave her the flu shot at last - Return to clinic in 1.5 mo with sugar log   2. Hypothyroidism - Patient's thyroid tests have been very uncontrolled, and this could have been from poor medication compliance +/- taking her levothyroxine along with calcium and PPIs. Latest levels from last visit were normal, though. - We discussed about taking levothyroxine correctly: Every day, fasting, separated by at least 30 minutes from breakfast, and separated by at least 4 hours from calcium, iron, multivitamins, acid reflux medicines. She is now taking it correctly. - For now, will continue levothyroxine 150 g daily  Philemon Kingdom, MD PhD [facility] Endocrinology

## 2016-06-23 NOTE — Patient Instructions (Addendum)
Please continue Levothyroxine 150 mcg daily.  Take the thyroid hormone every day, with water, at least 30 minutes before breakfast, separated by at least 4 hours from: - acid reflux medications - calcium - iron - multivitamins  Please stop Glipizide.  Please continue: - Lantus 40 units at bedtime - Januvia 100 mg daily in am, before b'fast - Novolog (take this 10 min before a meal): 14-18 units before L and D    Breakfast Lunch Dinner Bedtime   Januvia 100 mg  1 tab     Lantus     40 units   NovoLog   14 units for a smaller meal 18 units for a larger meal 14 units for a smaller meal 18 units for a larger meal    Please start a food and activity log for 2 weeks before next visit. Marland Kitchen  Please return in 1.5 months with your sugar log.

## 2016-06-25 DIAGNOSIS — Z01818 Encounter for other preprocedural examination: Secondary | ICD-10-CM | POA: Diagnosis not present

## 2016-06-25 DIAGNOSIS — G894 Chronic pain syndrome: Secondary | ICD-10-CM | POA: Diagnosis not present

## 2016-06-25 DIAGNOSIS — M5416 Radiculopathy, lumbar region: Secondary | ICD-10-CM | POA: Diagnosis not present

## 2016-06-27 ENCOUNTER — Other Ambulatory Visit: Payer: Self-pay

## 2016-07-17 DIAGNOSIS — M549 Dorsalgia, unspecified: Secondary | ICD-10-CM | POA: Diagnosis not present

## 2016-07-17 DIAGNOSIS — G8929 Other chronic pain: Secondary | ICD-10-CM | POA: Diagnosis not present

## 2016-08-08 ENCOUNTER — Telehealth: Payer: Self-pay | Admitting: Internal Medicine

## 2016-08-08 ENCOUNTER — Other Ambulatory Visit: Payer: Self-pay

## 2016-08-08 MED ORDER — LEVOTHYROXINE SODIUM 150 MCG PO TABS: 150.0000 ug | ORAL_TABLET | Freq: Every day | ORAL | 1 refills | Status: DC

## 2016-08-08 MED ORDER — INSULIN GLARGINE 100 UNIT/ML SOLOSTAR PEN
40.0000 [IU] | PEN_INJECTOR | Freq: Every day | SUBCUTANEOUS | 3 refills | Status: DC
Start: 2016-08-08 — End: 2016-09-22

## 2016-08-08 NOTE — Telephone Encounter (Signed)
Refill of  levothyroxine (SYNTHROID, LEVOTHROID) 150 MCG tablet   Insulin Glargine (LANTUS SOLOSTAR) 100 UNIT/ML Solostar Pen

## 2016-08-11 DIAGNOSIS — M5416 Radiculopathy, lumbar region: Secondary | ICD-10-CM | POA: Diagnosis not present

## 2016-08-11 DIAGNOSIS — G894 Chronic pain syndrome: Secondary | ICD-10-CM | POA: Diagnosis not present

## 2016-08-14 ENCOUNTER — Ambulatory Visit (INDEPENDENT_AMBULATORY_CARE_PROVIDER_SITE_OTHER): Payer: Medicare Other | Admitting: Internal Medicine

## 2016-08-14 ENCOUNTER — Encounter: Payer: Self-pay | Admitting: Internal Medicine

## 2016-08-14 VITALS — BP 128/88 | HR 77 | Ht 66.0 in | Wt 141.0 lb

## 2016-08-14 DIAGNOSIS — E039 Hypothyroidism, unspecified: Secondary | ICD-10-CM | POA: Diagnosis not present

## 2016-08-14 DIAGNOSIS — E1151 Type 2 diabetes mellitus with diabetic peripheral angiopathy without gangrene: Secondary | ICD-10-CM

## 2016-08-14 MED ORDER — SITAGLIPTIN PHOSPHATE 100 MG PO TABS: 100.0000 mg | ORAL_TABLET | Freq: Every day | ORAL | 3 refills | Status: DC

## 2016-08-14 NOTE — Patient Instructions (Addendum)
Please continue: - Lantus 40 units at bedtime - Januvia 100 mg daily in am, before b'fast  Stop Glipizide.  Change: - Novolog (take this 10 min before a meal): 14-18 units before L and D    Breakfast Lunch Dinner Bedtime   Januvia 100 mg  1 tab     Lantus     40 units   NovoLog   14 units for a smaller meal 18 units for a larger meal 14 units for a smaller meal 18 units for a larger meal    Do not skip Novolog or Lantus.  Please return in 3 months with your sugar log.

## 2016-08-14 NOTE — Progress Notes (Signed)
Patient ID: Sarah Payne, female   DOB: [birth year], 71 y.o.   MRN: [id number]  HPI: Sarah Payne is a 71 y.o.-year-old female, returning for f/u for DM2, dx 2003, insulin-dependent since 2013, uncontrolled, with complications (CAD, sCHF, PAD, CKD, PN) and also complicated by dementia and medication noncompliance. She also has hypothyroidism, also uncontrolled. Last visit 2.5 mo ago.  Will need to have back sx soon. On Oxycodone.  DM2: Last hemoglobin A1c remains very high: Lab Results  Component Value Date   HGBA1C 11.7 (H) 06/02/2016   HGBA1C 11.2 (H) 02/06/2016   HGBA1C 10.8 (H) 09/20/2015   Pt is on a regimen of: - Lantus 40 units at bedtime (misses doses) - Glipizide 5 mg 1x a day in am (did not stop it as advised at last visit) - Januvia 100 mg daily in am, before b'fast - Novolog (take this 10 min before a meal):   Breakfast Lunch Dinner Bedtime   Januvia 100 mg  1 tab     Glipizide 10 mg  1/2 tab     Lantus     40 units   NovoLog  Despite multiple promptings, she still takes Novolog inconsistently, 2h after meals!  14 units for a smaller meal 18 units for a larger meal 14 units for a smaller meal 18 units for a larger meal     Pt checks her sugars 1-2x a day and they are extremely fluctuating: - am:  68-204 >> 78-204 >> 99-317 >> 160-277, 282 >> >> 140-379, 433 >> 82, 129, 182-345 >> 110-338, 376 - 2h after b'fast: 156-267 >> ~100 >> 131, 135, 146, 149 >> 224, 355 >> 178 >> 340 >> n/c - before lunch: 89-212 >> 159-185 >> 140-170 >> 78-178 >> 60-311 >> 217 >> n/c >> 172-334 >> 219 - 2h after lunch: lowest 63-204 >> 60-189, most <140 >> 94-438 >> 79-166 >> 197-416 >> 74, 81, 163-299 >> 57, 100-319 - before dinner: 64-219 >> 140-202 >> 98 >> 69-125 >> 68- 249 >> 72 >> 131-260 >> 68, 169-278 >> 74-299 - 2h after dinner: 71, 305 >> ~100 >> 79-108 >> 370 >> 278 (300-400 in the hospital) >> n/c >> 77-277 >> 94-240 - bedtime: 91-155 >> 119-170 >> ~100 >> 110-149 (253) >> see  above >> 192 >> 382 >> 273-352 >> 62, 126-296 - nighttime: 54, 58, 145 >> 270 >> n/c + lows. Lowest sugar was 54 >> 60 >> 72 >> 140 >> 68 >> 57 x1; she has ? hypoglycemia awareness.  Highest sugar was 253 >> 398 >> 400s >> 433 >> 352 >> 376.  - + CKD, last BUN/creatinine:  Lab Results  Component Value Date   BUN 15 06/02/2016   CREATININE 1.04 06/02/2016  She is on Enalapril. - last set of lipids: Lab Results  Component Value Date   CHOL 301 (H) 06/02/2016   HDL 45.10 06/02/2016   LDLCALC 146 (H) 03/12/2015   LDLDIRECT 207.0 06/02/2016   TRIG 387.0 (H) 06/02/2016   CHOLHDL 7 06/02/2016  She is on Lipitor.She is on Zetia. She is on ASA 81. - last eye exam was in 2016. No DR. She had cataract sx. - + numbness and tingling in her feet.  Hypothyroidism: She is on levothyroxine 150 g - takes it daily.  She takes levothyroxine: - in am, fasting - with water - Separated by more than 30 minutes from breakfast - On calcium, acid reflux medicines >> later in the day - No  multivitamins - No iron   Latest TSH: Lab Results  Component Value Date   TSH 0.36 04/17/2016   TSH 0.24 (L) 02/06/2016   TSH 51.35 (H) 09/20/2015   TSH 0.52 06/18/2015   TSH 53.23 (H) 06/01/2015   TSH 40.79 (H) 03/12/2015   TSH 53.83 (H) 12/21/2014   TSH 60.34 (H) 08/16/2014   TSH > 100 08/02/2014   TSH 0.99 04/26/2014   FREET4 1.21 04/17/2016   FREET4 2.16 (H) 02/06/2016   FREET4 0.27 (L) 09/20/2015   FREET4 0.57 (L) 03/12/2015   FREET4 0.21 (L) 12/21/2014   FREET4 0.30 (L) 08/16/2014   FREET4 1.03 04/26/2014   FREET4 1.58 11-25-2013   Her sister died, her nephew killed himself and she had an AMI in summer 2017.  ROS: Constitutional: no weight loss/gain, _+ fatigue, + excessive urination, + poor sleep Eyes: no more blurry vision, no xerophthalmia ENT: no sore throat, no nodules neck, no dysphagia/odynophagia, no hoarseness Cardiovascular: no CP/SOB/no palpitations/leg swelling Respiratory: no  cough/no SOB Gastrointestinal: no N/ V/D/C/heartburn Musculoskeletal: no muscle/joint aches Skin: no rashes Neurological: no tremors/numbness/tingling/dizziness  I reviewed pt's medications, allergies, PMH, social hx, family hx, and changes were documented in the history of present illness. Otherwise, unchanged from my initial visit note.  PE: BP 128/88 (BP Location: Left Arm, Patient Position: Sitting)   Pulse 77   Ht 5\' 6"  (1.676 m)   Wt 141 lb (64 kg)   SpO2 98%   BMI 22.76 kg/m  Body mass index is 22.76 kg/m. Wt Readings from Last 3 Encounters:  08/14/16 141 lb (64 kg)  06/23/16 151 lb (68.5 kg)  06/02/16 149 lb 8 oz (67.8 kg)   Constitutional: normal weight, in NAD Eyes: PERRLA, EOMI, no exophthalmos ENT: moist mucous membranes, no thyromegaly, no cervical lymphadenopathy Cardiovascular: RRR, No MRG Respiratory: CTA B Gastrointestinal: abdomen soft, NT, ND, BS+ Musculoskeletal: no deformities, strength intact in all 4 Skin: moist, warm, no rashes, very dry skin Neurological: no tremor with outstretched hands, DTR normal in all 4  ASSESSMENT: 1. DM2, insulin-dependent, uncontrolled, with complications - med noncompliance (dementia) - CAD - had 3 AMI in 2011 >> 7 stents placed; new AMI February 21, 2016 after her sister's death - sCHF - PAD - CKD - PN  2. Hypothyroidism - Uncontrolled  PLAN:  1. Patient with long-standing uncontrolled diabetes, on oral + insulin antidiabetic regimen, with poor med compliance. Despite multiple promptings, she still takes Novolog inconsistently, 2h after meals! As a consequence, sugars are high after meals and drop after correction.  - We again went over correct insulin dosing and given new logs. Strongly advised to check sugars and take Novolog 10 min before eating and always take the Lantus. - Latest HbA1c was very high, at 11.2%. - I suggested to:  Patient Instructions   Please continue: - Lantus 40 units at bedtime - Januvia 100 mg  daily in am, before b'fast  Stop Glipizide.  Change: - Novolog (take this 10 min before a meal): 14-18 units before L and D    Breakfast Lunch Dinner Bedtime   Januvia 100 mg  1 tab     Lantus     40 units   NovoLog   14 units for a smaller meal 18 units for a larger meal 14 units for a smaller meal 18 units for a larger meal    Do not skip Novolog or Lantus.  Please return in 3 months with your sugar log.   - continue checking sugars  at different times of the day - check 3 times a day, rotating checks - She needs a new eye exam >> again advised to schedule - Return to clinic in 3 mo with sugar log   2. Hypothyroidism - Patient's thyroid tests have been very uncontrolled, and this could have been from poor medication compliance +/- taking her levothyroxine along with calcium and PPIs. Latest levels from last visit were normal, though. - We discussed about taking levothyroxine correctly: Every day, fasting, separated by at least 30 minutes from breakfast, and separated by at least 4 hours from calcium, iron, multivitamins, acid reflux medicines. She is now taking it correctly. - will continue levothyroxine 150 g daily  Philemon Kingdom, MD PhD [facility] Endocrinology

## 2016-08-15 ENCOUNTER — Telehealth: Payer: Self-pay | Admitting: Cardiovascular Disease

## 2016-08-15 NOTE — Telephone Encounter (Signed)
Received cardiac clearance request for pt to proceed w/ spinal cord stimulator trial w/ Dr. Tyler Pita. "We would need for Sarah Payne to hold her Plavix 7 days prior and for the duration of the trial which would be 3-5 days". Please send clearance and recommendation to [facility] @ [phone], Attn:  Jacklynn Bue.

## 2016-08-17 NOTE — Telephone Encounter (Signed)
Would call and confirm there has been no change in her cardiac symptoms Make sure no sx concerning for unstable angina If no change, would be acceptable risk for back surgery and ok to hold plavix for 7 days before and few days after Restart plavix when done Stay on aspirin

## 2016-08-18 ENCOUNTER — Other Ambulatory Visit: Payer: Self-pay

## 2016-08-18 MED ORDER — INSULIN PEN NEEDLE 32G X 4 MM MISC: 1 refills | Status: DC

## 2016-08-18 NOTE — Telephone Encounter (Addendum)
Spoke w/ pt.  She denies any recent chest pain or SOB. Asked her to call back if we can be of further assistance.   Routed to fax # provided.

## 2016-08-18 NOTE — Telephone Encounter (Signed)
rx submitted.  

## 2016-08-18 NOTE — Telephone Encounter (Signed)
Refill  Of  Insulin Pen Needle (INSUPEN PEN NEEDLES) 32G X 4 MM MISC 250 each 1 01/08/2016    Sig: Use to inject insulin 4 times daily.    [facility] [address], [place] [id number] (Phone) [phone] (Fax)

## 2016-09-03 DIAGNOSIS — R4189 Other symptoms and signs involving cognitive functions and awareness: Secondary | ICD-10-CM | POA: Insufficient documentation

## 2016-09-03 DIAGNOSIS — F1210 Cannabis abuse, uncomplicated: Secondary | ICD-10-CM | POA: Insufficient documentation

## 2016-09-03 DIAGNOSIS — F4323 Adjustment disorder with mixed anxiety and depressed mood: Secondary | ICD-10-CM | POA: Diagnosis not present

## 2016-09-04 ENCOUNTER — Other Ambulatory Visit: Payer: Self-pay

## 2016-09-04 ENCOUNTER — Telehealth: Payer: Self-pay | Admitting: Internal Medicine

## 2016-09-04 DIAGNOSIS — E1151 Type 2 diabetes mellitus with diabetic peripheral angiopathy without gangrene: Secondary | ICD-10-CM

## 2016-09-04 MED ORDER — GLUCOSE BLOOD VI STRP: ORAL_STRIP | 5 refills | Status: DC

## 2016-09-04 NOTE — Telephone Encounter (Signed)
rx submitted.  

## 2016-09-04 NOTE — Telephone Encounter (Signed)
Pt called in and needs a refill of her Freestyle Lite Test Strips, testing 5x daily sent to [facility] on [address] in [place].

## 2016-09-15 ENCOUNTER — Other Ambulatory Visit: Payer: Medicare Other

## 2016-09-15 DIAGNOSIS — G894 Chronic pain syndrome: Secondary | ICD-10-CM | POA: Diagnosis not present

## 2016-09-15 DIAGNOSIS — M5416 Radiculopathy, lumbar region: Secondary | ICD-10-CM | POA: Diagnosis not present

## 2016-09-16 ENCOUNTER — Ambulatory Visit (INDEPENDENT_AMBULATORY_CARE_PROVIDER_SITE_OTHER): Payer: Medicare Other | Admitting: Family Medicine

## 2016-09-16 ENCOUNTER — Encounter: Payer: Self-pay | Admitting: Family Medicine

## 2016-09-16 VITALS — BP 132/70 | HR 89 | Temp 97.8°F | Ht 66.0 in | Wt 142.4 lb

## 2016-09-16 DIAGNOSIS — I2510 Atherosclerotic heart disease of native coronary artery without angina pectoris: Secondary | ICD-10-CM | POA: Diagnosis not present

## 2016-09-16 DIAGNOSIS — E7800 Pure hypercholesterolemia, unspecified: Secondary | ICD-10-CM | POA: Diagnosis not present

## 2016-09-16 DIAGNOSIS — E1351 Other specified diabetes mellitus with diabetic peripheral angiopathy without gangrene: Secondary | ICD-10-CM

## 2016-09-16 DIAGNOSIS — E039 Hypothyroidism, unspecified: Secondary | ICD-10-CM

## 2016-09-16 DIAGNOSIS — E1151 Type 2 diabetes mellitus with diabetic peripheral angiopathy without gangrene: Secondary | ICD-10-CM | POA: Diagnosis not present

## 2016-09-16 LAB — HEMOGLOBIN A1C: Hgb A1c MFr Bld: 10.4 % — ABNORMAL HIGH (ref 4.6–6.5)

## 2016-09-16 NOTE — Progress Notes (Signed)
Pre visit review using our clinic review tool, if applicable. No additional management support is needed unless otherwise documented below in the visit note. 

## 2016-09-16 NOTE — Progress Notes (Signed)
Subjective:   Patient ID: Sarah Payne, female    DOB: 13-Aug-1945, 71 y.o.   MRN: [id number]  Sarah Payne is a pleasant 71 y.o. year old female who presents to clinic today with Follow-up  and follow up of chronic medical conditions on 09/16/2016  HPI:   DM- followed by Dr. Cruzita Lederer. Last seen on 08/14/16.  Note reviewed. Remains poorly controlled due to non compliance. Advised to stop glipizide.  Continue lantus and januvia and novalog dose changed.  She is having low blood sugars at home- often in the 50s and she is symptomatic with this.  Having high FSBS as well.  Brings her meter in with her today to compare to our glucometer.  Lab Results  Component Value Date   HGBA1C 11.7 (H) 06/02/2016   Hypothyroidism- also followed by Dr. Cruzita Lederer, endo. Lab Results  Component Value Date   TSH 0.36 04/17/2016    CAD, HTN, CHF- h/o stents, MI- followed by Dr. Rockey Situ.  Last saw him on 05/23/16- note reviewed. Advised to continue carvedilol, enalapril, Imdur and also encouraged compliance with rxs.  Lab Results  Component Value Date   CREATININE 1.04 06/02/2016     Lab Results  Component Value Date   CHOL 301 (H) 06/02/2016   HDL 45.10 06/02/2016   LDLCALC 146 (H) 03/12/2015   LDLDIRECT 207.0 06/02/2016   TRIG 387.0 (H) 06/02/2016   CHOLHDL 7 06/02/2016   Lab Results  Component Value Date   ALT 15 06/02/2016   AST 15 06/02/2016   ALKPHOS 110 06/02/2016   BILITOT 0.4 06/02/2016    Current Outpatient Prescriptions on File Prior to Visit  Medication Sig Dispense Refill  . ALPRAZolam (XANAX) 0.25 MG tablet Take 1 tablet (0.25 mg total) by mouth 2 (two) times daily as needed for anxiety. 20 tablet 0  . aspirin 81 MG tablet Take 81 mg by mouth daily.      Marland Kitchen atorvastatin (LIPITOR) 80 MG tablet Take 1 tablet (80 mg total) by mouth daily. 90 tablet 3  . Calcium & Magnesium Carbonates (MYLANTA PO) Take 15 mLs by mouth as needed (for indegestion).     . carvedilol (COREG) 3.125  MG tablet Take 2 tablets (6.25 mg total) by mouth 2 (two) times daily with a meal. 360 tablet 3  . clopidogrel (PLAVIX) 75 MG tablet TAKE 1 TABLET (75 MG TOTAL) BY MOUTH DAILY. 90 tablet 3  . enalapril (VASOTEC) 10 MG tablet Take 1 tablet (10 mg total) by mouth daily. 90 tablet 3  . ezetimibe (ZETIA) 10 MG tablet Take 1 tablet (10 mg total) by mouth daily. 90 tablet 3  . glucose blood (FREESTYLE LITE) test strip Check blood sugar 5 times daily to check blood sugars - Uncontrolled diabetes mellitus 400 each 5  . glucose monitoring kit (FREESTYLE) monitoring kit 1 each by Does not apply route as needed for other. PHARMACY PLEASE SUBSTITUTE WHATEVER GLUCOMETER YOU CARRY 1 each 1  . insulin aspart (NOVOLOG FLEXPEN) 100 UNIT/ML FlexPen Inject 12-14 Units into the skin 3 (three) times daily with meals. Normally takes 12 units 15 mL 3  . Insulin Glargine (LANTUS SOLOSTAR) 100 UNIT/ML Solostar Pen Inject 40 Units into the skin at bedtime. 15 mL 3  . Insulin Pen Needle (INSUPEN PEN NEEDLES) 32G X 4 MM MISC Use to inject insulin 4 times daily. 250 each 1  . isosorbide mononitrate (IMDUR) 30 MG 24 hr tablet Take 1 tablet (30 mg total) by mouth daily. 90 tablet  3  . lansoprazole (PREVACID) 15 MG capsule Take 15 mg by mouth daily. Reported on 11/06/2015    . levothyroxine (SYNTHROID, LEVOTHROID) 150 MCG tablet Take 1 tablet (150 mcg total) by mouth daily. 45 tablet 1  . magnesium 30 MG tablet Take 30 mg by mouth daily. Reported on 09/20/2015    . methocarbamol (ROBAXIN) 500 MG tablet Take 1 tablet (500 mg total) by mouth every 8 (eight) hours as needed for muscle spasms. 15 tablet 0  . ondansetron (ZOFRAN) 4 MG tablet Take 1 tab every 6 hours as needed for nausea. 30 tablet 2  . oxyCODONE-acetaminophen (ROXICET) 5-325 MG tablet Take 1 tablet by mouth every 4 (four) hours as needed for severe pain. 20 tablet 0  . pantoprazole (PROTONIX) 40 MG tablet Take 1 tablet (40 mg total) by mouth daily. 90 tablet 3  .  sitaGLIPtin (JANUVIA) 100 MG tablet Take 1 tablet (100 mg total) by mouth daily. 90 tablet 3  . traMADol (ULTRAM) 50 MG tablet Take 1 tablet (50 mg total) by mouth every 6 (six) hours as needed. 12 tablet 0  . traZODone (DESYREL) 100 MG tablet TAKE 1 TABLET (100 MG TOTAL) BY MOUTH AT BEDTIME. (Patient taking differently: Take 200 mg by mouth at bedtime. ) 90 tablet 3   No current facility-administered medications on file prior to visit.     Allergies  Allergen Reactions  . No Known Allergies     Past Medical History:  Diagnosis Date  . Cataract    BILATERAL REMOVED  . Chronic low back pain   . COPD (chronic obstructive pulmonary disease) ([place])   . Coronary artery disease    a. multiple PCIs to the mLCx. b. stent to dRCA 08/2011 in setting of NSTEMI. c. DES to prox-mid LCx for ISR 09/2011. d.  DESx2 to prox-mRCA 07/2012. e. Takotsubo event 12/2015 with patent stents.  . Depression   . Diabetes mellitus   . Diverticulosis   . Frequent PVCs    a. Noted in hospital 12/2015.  Marland Kitchen GERD (gastroesophageal reflux disease)   . Hyperlipidemia   . Hypertension   . Hypothyroidism   . Impingement syndrome of left shoulder   . Marijuana abuse   . Myocardial infarction    x 5  . Sleep apnea    mild-does not use cpap  . Takotsubo cardiomyopathy    a. 12/2015 - nephew committed suicide 1 week prior, sister died the morning of presentation - initially called a STEMI; cath with patent stents. LVEF 25-30%.  . Tendonitis of left rotator cuff   . Tobacco abuse   . Vascular dementia     Past Surgical History:  Procedure Laterality Date  . ABDOMINAL HYSTERECTOMY    . APPENDECTOMY    . BACK SURGERY    . CARDIAC CATHETERIZATION  2013  . CARDIAC CATHETERIZATION  2016  . CARDIAC CATHETERIZATION N/A 01/01/2016   Procedure: Left Heart Cath and Coronary Angiography;  Surgeon: Jettie Booze, MD;  Location: [facility];  Service: Cardiovascular;  Laterality: N/A;  . CATARACT EXTRACTION W/PHACO  Left 01/30/2015   Procedure: CATARACT EXTRACTION PHACO AND INTRAOCULAR LENS PLACEMENT (IOC);  Surgeon: Birder Robson, MD;  Location: [facility];  Service: Ophthalmology;  Laterality: Left;  [place] 00:47   . CATARACT EXTRACTION W/PHACO Right 02/13/2015   Procedure: CATARACT EXTRACTION PHACO AND INTRAOCULAR LENS PLACEMENT (IOC);  Surgeon: Birder Robson, MD;  Location: [facility];  Service: Ophthalmology;  Laterality: Right;  cassette lot # [id number] H [place]  00:29.9 AP  20.7 CDE  6.20  . COLONOSCOPY N/A 11/02/2014   Procedure: COLONOSCOPY;  Surgeon: Inda Castle, MD;  Location: [facility];  Service: Endoscopy;  Laterality: N/A;  . CORONARY ANGIOPLASTY  2012   stent x 3   . CORONARY ANGIOPLASTY WITH STENT PLACEMENT  2013    Family History  Problem Relation Age of Onset  . Heart attack Mother     First MI @ 40 - Died @ 43  . Heart disease Mother   . Heart disease Father     Died @ 17  . Throat cancer Brother   . Liver cancer Brother   . Colon cancer Sister     Social History   Social History  . Marital status: Married    Spouse name: N/A  . Number of children: N/A  . Years of education: N/A   Occupational History  . Not on file.   Social History Main Topics  . Smoking status: Current Every Day Smoker    Packs/day: 1.00    Years: 45.00    Types: Cigarettes  . Smokeless tobacco: Never Used     Comment: Has cut back, trying to quit.   . Alcohol use No  . Drug use: Yes    Types: Marijuana     Comment: last night 4.13.16  . Sexual activity: Not on file   Other Topics Concern  . Not on file   Social History Narrative   Lives at home with her husband in [place].  Previously used marijuana - quit.      Regular exercise: no/ pain from a frozen rotator cuff   Caffeine use: coffee daily and pepsi      Does not have a living will.   Daughters and husband know her wishes- would desire CPR but not prolonged life support if futile   The PMH, PSH, Social History, Family History,  Medications, and allergies have been reviewed in [facility], and have been updated if relevant.      Review of Systems  Constitutional: Negative.   HENT: Negative.   Eyes: Negative.   Respiratory: Negative.   Cardiovascular: Negative.   Gastrointestinal: Positive for diarrhea. Negative for nausea and rectal pain.  Endocrine: Negative.   Genitourinary: Negative.   Musculoskeletal: Negative.   Skin: Negative.   Allergic/Immunologic: Negative.   Neurological: Positive for light-headedness.  Hematological: Negative.   Psychiatric/Behavioral: Negative.   All other systems reviewed and are negative.      Objective:    BP 132/70 (BP Location: Right Arm, Patient Position: Sitting, Cuff Size: Normal)   Pulse 89   Temp 97.8 F (36.6 C) (Oral)   Ht 5' 6"  (1.676 m)   Wt 142 lb 6.4 oz (64.6 kg)   SpO2 97%   BMI 22.98 kg/m    Physical Exam  Constitutional: She is oriented to person, place, and time. She appears well-developed and well-nourished. No distress.  HENT:  Head: Normocephalic.  Eyes: Conjunctivae are normal.  Neck: Normal range of motion. No thyromegaly present.  Cardiovascular: Normal rate and regular rhythm.   Pulmonary/Chest: Effort normal and breath sounds normal.  Abdominal: Soft.  Musculoskeletal: Normal range of motion.  Neurological: She is alert and oriented to person, place, and time. No cranial nerve deficit.  Skin: Skin is warm and dry. She is not diaphoretic.  Psychiatric: She has a normal mood and affect. Her behavior is normal. Judgment and thought content normal.  Nursing note and vitals reviewed.  Assessment & Plan:   Type II diabetes mellitus with peripheral circulatory disorder (HCC) - Plan: Hemoglobin A1c  CAD, multiple vessel  Pure hypercholesterolemia  Hypothyroidism, unspecified type  Diabetes mellitus type 2 with peripheral artery disease ([place])  Other specified diabetes mellitus with diabetic peripheral angiopathy without  gangrene, unspecified long term insulin use status ([place]) No Follow-up on file.

## 2016-09-16 NOTE — Assessment & Plan Note (Signed)
FSBS here is 324, compared to her meter- 282. Advised calling Dr. Cruzita Lederer ASAP when she leaves here and also to get a new glucometer. a1c checked as well. The patient indicates understanding of these issues and agrees with the plan.

## 2016-09-17 ENCOUNTER — Telehealth: Payer: Self-pay

## 2016-09-17 NOTE — Telephone Encounter (Signed)
-----   Message from Philemon Kingdom, MD sent at 09/17/2016  8:04 AM EST ----- Almyra Free, this patient is having fluctuating blood sugars and I need to see her sooner than her appointment in April. Can she come tomorrow at 4:15?

## 2016-09-17 NOTE — Telephone Encounter (Signed)
Called patient to advise of lab work, and per Dr.Gherghe's note, she would like her to come in tomorrow to be seen instead of April. Patient understood and is coming then. No other questions at this time.

## 2016-09-18 ENCOUNTER — Ambulatory Visit (INDEPENDENT_AMBULATORY_CARE_PROVIDER_SITE_OTHER): Payer: Medicare Other | Admitting: Internal Medicine

## 2016-09-18 ENCOUNTER — Encounter: Payer: Self-pay | Admitting: Internal Medicine

## 2016-09-18 VITALS — BP 118/62 | HR 98 | Ht 66.0 in | Wt 140.0 lb

## 2016-09-18 DIAGNOSIS — E1151 Type 2 diabetes mellitus with diabetic peripheral angiopathy without gangrene: Secondary | ICD-10-CM

## 2016-09-18 DIAGNOSIS — E039 Hypothyroidism, unspecified: Secondary | ICD-10-CM

## 2016-09-18 DIAGNOSIS — I2510 Atherosclerotic heart disease of native coronary artery without angina pectoris: Secondary | ICD-10-CM | POA: Diagnosis not present

## 2016-09-18 NOTE — Patient Instructions (Addendum)
Please continue: - Januvia 100 mg in am, before b'fast  Decrease: - Lantus to 30 units at bedtime - Novolog to: 8 units before a smaller meal 10 units before a regular meal 12 units before a larger meal  If sugars before the meal are: 70 or lower, do not take insulin before that meal 71- 99, take no more than 6 units with that meal 100 or above, take the whole dose of insulin  Do not skip Novolog or Lantus.  Please return in 1.5 months with your sugar log.

## 2016-09-18 NOTE — Progress Notes (Signed)
Patient ID: Sarah Payne, female   DOB: [birth year], 71 y.o.   MRN: [id number]  HPI: Sarah Payne is a 71 y.o.-year-old female, returning for f/u for DM2, dx 2003, insulin-dependent since 2013, uncontrolled, with complications (CAD, sCHF, PAD, CKD, PN) and also complicated by dementia and medication noncompliance, also hypothyroidism, uncontrolled. Last visit 1.5 mo ago.  DM2: Last hemoglobin A1c remains very high: Lab Results  Component Value Date   HGBA1C 10.4 (H) 09/16/2016   HGBA1C 11.7 (H) 06/02/2016   HGBA1C 11.2 (H) 02/06/2016   Pt is on a regimen of: - Lantus 40 units at bedtime - Januvia 100 mg daily in am, before b'fast - Novolog (take this 10 min before a meal): 14-18 units before L and D    Breakfast Lunch Dinner Bedtime   Januvia 100 mg  1 tab     Lantus     40 units   NovoLog   14 units for a smaller meal 18 units for a larger meal 14 units for a smaller meal 18 units for a larger meal    Pt checks her sugars 1-2x a day and they are still extremely fluctuating: - am: 160-277, 282 >> 140-379, 433 >> 82, 129, 182-345 >> 110-338, 376 >> 80 x1, 100 x1, 170-267 - 2h after b'fast:  131, 135, 146, 149 >> 224, 355 >> 178 >> 340 >> n/c >> 245-400 - before lunch:  78-178 >> 60-311 >> 217 >> n/c >> 172-334 >> 219 >> 57 (correction of 400), 137-256 - 2h after lunch:  94-438 >> 79-166 >> 197-416 >> 74, 81, 163-299 >> 57, 100-319 >> 69 x1, 70 x1, 93-298 - before dinner: 69-125 >> 68- 249 >> 72 >> 131-260 >> 68, 169-278 >> 74-299 >> 56, 74-222 - 2h after dinner: 278 (300-400 in the hospital) >> n/c >> 77-277 >> 94-240 >> 55-285 - bedtime: 110-149 (253) >> see above >> 192 >> 382 >> 273-352 >> 62, 126-296 >> 69, 150-242 - nighttime: 54, 58, 145 >> 270 >> n/c + lows. Lowest sugar was 57 x1 >> 55; she has ? hypoglycemia awareness.  Highest sugar was 376 >> 400  - + CKD, last BUN/creatinine:  Lab Results  Component Value Date   BUN 15 06/02/2016   CREATININE 1.04 06/02/2016  She  is on Enalapril. - last set of lipids: Lab Results  Component Value Date   CHOL 301 (H) 06/02/2016   HDL 45.10 06/02/2016   LDLCALC 146 (H) 03/12/2015   LDLDIRECT 207.0 06/02/2016   TRIG 387.0 (H) 06/02/2016   CHOLHDL 7 06/02/2016  She is on Lipitor.She is on Zetia. She is on ASA 81. - last eye exam was in 2016. No DR. She had cataract sx. - + numbness and tingling in her feet.  Hypothyroidism: She is on levothyroxine 150 g - takes it daily.  She takes levothyroxine: - in am, fasting - with water - Separated by more than 30 minutes from breakfast - On calcium, acid reflux medicines >> later in the day - No multivitamins - No iron   Latest TSH: Lab Results  Component Value Date   TSH 0.36 04/17/2016   TSH 0.24 (L) 02/06/2016   TSH 51.35 (H) 09/20/2015   TSH 0.52 06/18/2015   TSH 53.23 (H) 06/01/2015   TSH 40.79 (H) 03/12/2015   TSH 53.83 (H) 12/21/2014   TSH 60.34 (H) 08/16/2014   TSH > 100 08/02/2014   TSH 0.99 04/26/2014   FREET4 1.21 04/17/2016  FREET4 2.16 (H) 02/06/2016   FREET4 0.27 (L) 09/20/2015   FREET4 0.57 (L) 03/12/2015   FREET4 0.21 (L) 12/21/2014   FREET4 0.30 (L) 08/16/2014   FREET4 1.03 04/26/2014   FREET4 1.58 2013/11/24   Her sister died, her nephew killed himself and she had an AMI in summer 2017.  ROS: Constitutional: no weight loss/gain, + fatigue, + excessive urination, + poor sleep Eyes: no more blurry vision, no xerophthalmia ENT: no sore throat, no nodules neck, no dysphagia/odynophagia, no hoarseness Cardiovascular: no CP/SOB/+ palpitations/no leg swelling Respiratory: no cough/no SOB Gastrointestinal: no N/ V/D/C/heartburn Musculoskeletal: no muscle/joint aches Skin: no rashes Neurological: no tremors/numbness/tingling/dizziness  I reviewed pt's medications, allergies, PMH, social hx, family hx, and changes were documented in the history of present illness. Otherwise, unchanged from my initial visit note.  PE: BP 118/62 (BP  Location: Left Arm, Patient Position: Sitting)   Pulse 98   Ht 5\' 6"  (1.676 m)   Wt 140 lb (63.5 kg)   SpO2 96%   BMI 22.60 kg/m  Body mass index is 22.6 kg/m. Wt Readings from Last 3 Encounters:  09/18/16 140 lb (63.5 kg)  09/16/16 142 lb 6.4 oz (64.6 kg)  08/14/16 141 lb (64 kg)   Constitutional: normal weight, in NAD Eyes: PERRLA, EOMI, no exophthalmos ENT: moist mucous membranes, no thyromegaly, no cervical lymphadenopathy Cardiovascular: Tachycardia, RR, No MRG Respiratory: CTA B Gastrointestinal: abdomen soft, NT, ND, BS+ Musculoskeletal: no deformities, strength intact in all 4 Skin: moist, warm, no rashes, very dry skin Neurological: + Mild tremor with outstretched hands, DTR normal in all 4  ASSESSMENT: 1. DM2, insulin-dependent, uncontrolled, with complications - med noncompliance (dementia) - CAD - had 3 AMI in 2011 >> 7 stents placed; new AMI Feb 20, 2016 after her sister's death - sCHF - PAD - CKD - PN  2. Hypothyroidism - Uncontrolled  PLAN:  1. Patient with long-standing uncontrolled diabetes, on oral + insulin antidiabetic regimen, with poor med compliance, which is slowly improving. She used to take her mealtime insulin after she ate, with subsequent large fluctuations in her blood sugars. She still has fluctuations, mainly due to increased hypoglycemia after meals which prompts her to avoid taking her long-acting insulin, with subsequent hyperglycemia. Therefore, today, we will decrease her NovoLog doses with meals and I will also give her instructions about NovoLog dosing give her sugars are lower before meals. We'll also decrease Lantus at bedtime to avoid hypoglycemia during the night. - Latest HbA1c reviewed: slightly better, at 10.4%. - I suggested to:  Patient Instructions  Please continue: - Januvia 100 mg in am, before b'fast  Decrease: - Lantus to 30 units at bedtime - Novolog to: 8 units before a smaller meal 10 units before a regular meal 12  units before a larger meal  If sugars before the meal are: 70 or lower, do not take insulin before that meal 71- 99, take no more than 6 units with that meal 100 or above, take the whole dose of insulin  Do not skip Novolog or Lantus.  Please return in 1.5 months with your sugar log.    - continue checking sugars at different times of the day - check 3 times a day, rotating checks - She needs a new eye exam >> again advised to schedule - Return to clinic in 1.5 mo with sugar log   2. Hypothyroidism - Patient's thyroid tests have been very uncontrolled, and this could have been from poor medication compliance +/- taking her levothyroxine  along with calcium and PPIs. Latest levels from last visit were normal. - However, today, she complains of feeling shaky, chest discomfort, and she also has tachycardia on exam. Therefore, I will repeat her TFTs today. - We discussed about taking levothyroxine correctly: Every day, fasting, separated by at least 30 minutes from breakfast, and separated by at least 4 hours from calcium, iron, multivitamins, acid reflux medicines. She is now taking it correctly. - For now, will continue levothyroxine 150 g daily  Component     Latest Ref Rng & Units 09/18/2016  TSH     0.35 - 4.50 uIU/mL 0.07 (L)  T4,Free(Direct)     0.60 - 1.60 ng/dL 0.97   Pt's TFTs show thyrotoxicosis >> will need to decrease her LT4 to 125 mcg daily. Will need to recheck TFTs in 1.5 months. We'll also ask her to skip the dose of levothyroxine for the next 2 days.  - time spent with the patient: 40 min, of which >50% was spent in reviewing her log and insulin doses, discussing her hypo- and hyper-glycemic episodes, reviewing previous labs and developing a plan to avoid hypo- and hyper-glycemia. We also addressed her hypothyroidism today.   Philemon Kingdom, MD PhD [facility] Endocrinology

## 2016-09-19 ENCOUNTER — Telehealth: Payer: Self-pay | Admitting: Cardiovascular Disease

## 2016-09-19 LAB — T4, FREE: Free T4: 0.97 ng/dL (ref 0.60–1.60)

## 2016-09-19 LAB — TSH: TSH: 0.07 u[IU]/mL — ABNORMAL LOW (ref 0.35–4.50)

## 2016-09-19 MED ORDER — LEVOTHYROXINE SODIUM 125 MCG PO TABS: 125.0000 ug | ORAL_TABLET | Freq: Every day | ORAL | 1 refills | Status: DC

## 2016-09-19 NOTE — Telephone Encounter (Signed)
Pt states she has had the "jitters" for about 3 weeks now. She thought it was her sugar, but she went to her "sugar Dr." yesterday,  and her Dr. Rockey Situ her her HR was elevated. Pt states the Dr. Did not tell her what her HR was. She just told her it was elevated. Pt states 2 days ago her chest was hurting. She states it has eased off, and is not hurting as much.

## 2016-09-19 NOTE — Telephone Encounter (Signed)
Patient states that has been having some chest soreness the last couple of days. She states this started on Wednesday when she had a stressful episode and developed chest pain. She states that she went home and rested and it resolved. Then she started experiencing chest soreness which would hurt worse when she pushes on it. She denied any other symptoms at this time. Instructed her to try one dose of ibuprofen to see if that would help. Scheduled her to come in to see Dr. Rockey Situ on 09/30/16 at 2:20PM. Instructed her to go to emergency room if symptoms persist or worsen and if she should develop any additional symptoms such as shortness of breath. She verbalized understanding of our conversation, agreement with plan, and had no further questions at this time.

## 2016-09-22 ENCOUNTER — Telehealth: Payer: Self-pay | Admitting: Internal Medicine

## 2016-09-22 ENCOUNTER — Other Ambulatory Visit: Payer: Self-pay

## 2016-09-22 MED ORDER — INSULIN ASPART 100 UNIT/ML FLEXPEN: 12.0000 [IU] | PEN_INJECTOR | Freq: Three times a day (TID) | SUBCUTANEOUS | 3 refills | Status: DC

## 2016-09-22 MED ORDER — INSULIN GLARGINE 100 UNIT/ML SOLOSTAR PEN: 40.0000 [IU] | PEN_INJECTOR | Freq: Every day | SUBCUTANEOUS | 3 refills | Status: DC

## 2016-09-22 NOTE — Telephone Encounter (Signed)
Pt needs her Lantus and Novolog refilled and sent to the [facility] on [address] in [place].

## 2016-09-29 NOTE — Progress Notes (Signed)
Cardiology Office Note  Date:  09/30/2016   ID:  Sarah Payne, DOB 11/01/1945, MRN [id number]  PCP:  Arnette Norris, MD   Chief Complaint  Patient presents with  . other    Pt. c/o chest pain about 1 week ago but has subsided since; pt. was having some stress with a family member.     HPI:  71 year old woman with a long history of smoking, coronary artery disease, prior stent x3 placed to the mid left circumflex with moderate to severe LAD disease , stent placed to her distal RCA in February 2013, stent to the mid left circumflex in March 2013 , poorly controlled diabetes with hemoglobin A1c of 10, depression, hypertension, cardiac catheterization 08/19/2012 showing moderate mid LAD disease at the takeoff of the diagonal vessel, severe ostial to mid RCA disease, ejection fraction greater than 55%. She had FFR pressure wire showing severe disease of the RCA with drug-eluting stent x2 placed. C. difficile late 2015 after antibiotics Previous history of running out of her medications She presents today for follow-up of her coronary artery disease, Recent hospitalization at [facility], cardiac catheterization performed showing patent stents Postop procedure complication of groin hematoma  Stress at home, Grandson "committed" to psych ward  Still smoking a little HBA1C 10 Taking her lipitor 80 daily with zetia (compliant per the patient)  Chronic severe back pain, Seen by neurosurgery.  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 Diagnosed with unstable angina. Had cardiac catheterization showing severe stable coronary disease, Echocardiogram with normal ejection fraction Total cholesterol 292  Since her discharge, she denies any further chest pain. She reports that she is taking her medications. Recently been to diabetes instruction classes, scheduled to start insulin. She has been compliant with her statin per the patient She has developed a cough since her discharge from the hospital. She does not want antibiotics given prior history of C. Difficile at the end of 2015  Details of the echocardiogram 08/01/2014; ejection fraction 55-60%, normal right ventricular systolic pressure, normal right heart systolic function Catheterization details, unchanged from prior catheterization. Lab report has not been signed off in the computer. Catheterization by Dr. Ellyn Hack. Medical management was recommended  Previous Lab work showing hemoglobin A1c 8.8, total cholesterol 287, LDL 174  Echocardiogram February 07, 2011 shows normal LV systolic function, diastolic dysfunction, moderate inferior wall hypokinesis, normal right ventricular systolic pressures.    PMH:   has a past medical history of Cataract; Chronic low back pain; COPD (chronic obstructive pulmonary disease) ([place]); Coronary artery disease; Depression; Diabetes mellitus; Diverticulosis; Frequent PVCs; GERD (gastroesophageal reflux disease); Hyperlipidemia; Hypertension; Hypothyroidism; Impingement syndrome of left shoulder; Marijuana abuse; Myocardial infarction; Sleep apnea; Takotsubo cardiomyopathy; Tendonitis of left rotator  cuff; Tobacco  abuse; and Vascular dementia.  PSH:    Past Surgical History:  Procedure Laterality Date  . ABDOMINAL HYSTERECTOMY    . APPENDECTOMY    . BACK SURGERY    . CARDIAC CATHETERIZATION  2013  . CARDIAC CATHETERIZATION  2016  . CARDIAC CATHETERIZATION N/A 01/01/2016   Procedure: Left Heart Cath and Coronary Angiography;  Surgeon: Jettie Booze, MD;  Location: [facility];  Service: Cardiovascular;  Laterality: N/A;  . CATARACT EXTRACTION W/PHACO Left 01/30/2015   Procedure: CATARACT EXTRACTION PHACO AND INTRAOCULAR LENS PLACEMENT (IOC);  Surgeon: Birder Robson, MD;  Location: [facility];  Service: Ophthalmology;  Laterality: Left;  [place] 00:47   . CATARACT EXTRACTION W/PHACO Right 02/13/2015   Procedure: CATARACT EXTRACTION PHACO AND INTRAOCULAR LENS PLACEMENT (IOC);  Surgeon: Birder Robson, MD;  Location: [facility];  Service: Ophthalmology;  Laterality: Right;  cassette lot # [id number] H [place]  00:29.9 AP  20.7 CDE  6.20  . COLONOSCOPY N/A 11/02/2014   Procedure: COLONOSCOPY;  Surgeon: Inda Castle, MD;  Location: [facility];  Service: Endoscopy;  Laterality: N/A;  . CORONARY ANGIOPLASTY  2012   stent x 3   . CORONARY ANGIOPLASTY WITH STENT PLACEMENT  2013    Current Outpatient Prescriptions  Medication Sig Dispense Refill  . ALPRAZolam (XANAX) 0.25 MG tablet Take 1 tablet (0.25 mg total) by mouth 2 (two) times daily as needed for anxiety. 20 tablet 0  . aspirin 81 MG tablet Take 81 mg by mouth daily.      . Calcium & Magnesium Carbonates (MYLANTA PO) Take 15 mLs by mouth as needed (for indegestion).     . carvedilol (COREG) 3.125 MG tablet Take 2 tablets (6.25 mg total) by mouth 2 (two) times daily with a meal. 360 tablet 3  . clopidogrel (PLAVIX) 75 MG tablet TAKE 1 TABLET (75 MG TOTAL) BY MOUTH DAILY. 90 tablet 3  . enalapril (VASOTEC) 10 MG tablet Take 1 tablet (10 mg total) by mouth daily. 90 tablet 3  . ezetimibe (ZETIA) 10 MG tablet Take 1 tablet (10 mg total)  by mouth daily. 90 tablet 3  . glucose blood (FREESTYLE LITE) test strip Check blood sugar 5 times daily to check blood sugars - Uncontrolled diabetes mellitus 400 each 5  . glucose monitoring kit (FREESTYLE) monitoring kit 1 each by Does not apply route as needed for other. PHARMACY PLEASE SUBSTITUTE WHATEVER GLUCOMETER YOU CARRY 1 each 1  . insulin aspart (NOVOLOG FLEXPEN) 100 UNIT/ML FlexPen Inject 12-14 Units into the skin 3 (three) times daily with meals. Normally takes 12 units 15 mL 3  . Insulin Glargine (LANTUS SOLOSTAR) 100 UNIT/ML Solostar Pen Inject 40 Units into the skin at bedtime. 15 mL 3  . Insulin Pen Needle (INSUPEN PEN NEEDLES) 32G X 4 MM MISC Use to inject insulin 4 times daily. 250 each 1  . isosorbide mononitrate (IMDUR) 30 MG 24 hr tablet Take 1 tablet (30 mg total) by mouth daily. 90 tablet 3  . lansoprazole (PREVACID) 15 MG capsule Take 15 mg by mouth daily. Reported on 11/06/2015    . levothyroxine (SYNTHROID, LEVOTHROID) 125 MCG tablet Take 1 tablet (125 mcg total) by mouth daily. 45 tablet 1  . magnesium 30 MG tablet Take 30 mg by mouth daily. Reported on 09/20/2015    . methocarbamol (ROBAXIN) 500 MG tablet Take 1 tablet (500 mg total) by mouth every 8 (eight) hours as needed for muscle spasms. 15 tablet 0  . ondansetron (ZOFRAN) 4 MG  tablet Take 1 tab every 6 hours as needed for nausea. 30 tablet 2  . oxyCODONE-acetaminophen (ROXICET) 5-325 MG tablet Take 1 tablet by mouth every 4 (four) hours as needed for severe pain. 20 tablet 0  . pantoprazole (PROTONIX) 40 MG tablet Take 1 tablet (40 mg total) by mouth daily. 90 tablet 3  . sitaGLIPtin (JANUVIA) 100 MG tablet Take 1 tablet (100 mg total) by mouth daily. 90 tablet 3  . traMADol (ULTRAM) 50 MG tablet Take 1 tablet (50 mg total) by mouth every 6 (six) hours as needed. 12 tablet 0  . traZODone (DESYREL) 100 MG tablet TAKE 1 TABLET (100 MG TOTAL) BY MOUTH AT BEDTIME. (Patient taking differently: Take 200 mg by mouth at  bedtime. ) 90 tablet 3  . rosuvastatin (CRESTOR) 40 MG tablet Take 1 tablet (40 mg total) by mouth daily. 90 tablet 3   No current facility-administered medications for this visit.      Allergies:   No known allergies   Social History:  The patient  reports that she has been smoking Cigarettes.  She has a 45.00 pack-year smoking history. She has never used smokeless tobacco. She reports that she uses drugs, including Marijuana. She reports that she does not drink alcohol.   Family History:   family history includes Colon cancer in her sister; Heart attack in her mother; Heart disease in her father and mother; Liver cancer in her brother; Throat cancer in her brother.    Review of Systems: Review of Systems  Constitutional: Negative.   Respiratory: Positive for cough.   Cardiovascular: Negative.   Gastrointestinal: Negative.   Musculoskeletal: Negative.   Neurological: Negative.   Psychiatric/Behavioral: The patient is nervous/anxious.   All other systems reviewed and are negative.    PHYSICAL EXAM: VS:  BP 140/82 (BP Location: Left Arm, Patient Position: Sitting, Cuff Size: Normal)   Ht 5' 6.5" (1.689 m)   Wt 141 lb 12 oz (64.3 kg)   BMI 22.54 kg/m  , BMI Body mass index is 22.54 kg/m. GEN: Well nourished, well developed, in no acute distress  HEENT: normal  Neck: no JVD, + carotid bruits, no masses Cardiac: RRR; no murmurs, rubs, or gallops,no edema  Respiratory:  clear to auscultation bilaterally, normal work of breathing GI: soft, nontender, nondistended, + BS MS: no deformity or atrophy  Skin: warm and dry, no rash Neuro:  Strength and sensation are intact Psych: euthymic mood, full affect    Recent Labs: 06/02/2016: ALT 15; BUN 15; Creatinine, Ser 1.04; Hemoglobin 14.7; Platelets 289.0; Potassium 4.2; Sodium 136 09/18/2016: TSH 0.07    Lipid Panel Lab Results  Component Value Date   CHOL 301 (H) 06/02/2016   HDL 45.10 06/02/2016   LDLCALC 146 (H) 03/12/2015    TRIG 387.0 (H) 06/02/2016      Wt Readings from Last 3 Encounters:  09/30/16 141 lb 12 oz (64.3 kg)  09/18/16 140 lb (63.5 kg)  09/16/16 142 lb 6.4 oz (64.6 kg)       ASSESSMENT AND PLAN:  CAD, multiple vessel - Plan: EKG 12-Lead, VAS US CAROTID Currently with no symptoms of angina. No further workup at this time. Continue current medication regimen.  Mixed hyperlipidemia - Plan: EKG 12-Lead, VAS US CAROTID Long discussion with her concerning various treatment options She reports that she is compliant with her Lipitor and Zetia Dramatic climb in her numbers over the past 4 years Recommended she stop the Lipitor, start Crestor 40 mg daily with Zetia  Recheck cholesterol in 2-3 months If numbers continue to run high, she would likely benefit from praluent or repatha New Medications discussed with her  Diabetes mellitus type 2 with peripheral artery disease ([place]) - stressed importance of strict diet, working closely with endocrinology, medication compliance Numbers still markedly elevated typically in light of coronary disease, PAD  Chronic systolic congestive heart failure ([place]) - Plan: EKG 12-Lead, VAS US CAROTID Appears relatively euvolemic on today's visit. No medication changes made  She is on ACE inhibitor, Imdur, carvedilol   Essential hypertension - Plan: EKG 12-Lead, VAS US CAROTID Medications as above, well controlled   Tobacco abuse - Plan: EKG 12-Lead, VAS US CAROTID Still smoking  We have encouraged her to continue to work on weaning her cigarettes and smoking cessation. She will continue to work on this and does not want any assistance with chantix.   COPD, moderate ([place]) - Plan: EKG 12-Lead, VAS US CAROTID Smoking cessation recommended   Total encounter time more than 25 minutes  Greater than 50% was spent in counseling and coordination of care with the patient   Disposition:   F/U  6 months   Orders Placed This Encounter  Procedures  . EKG 12-Lead      Signed, Esmond Plants, M.D., Ph.D. 09/30/2016  [place], [place]

## 2016-09-30 ENCOUNTER — Ambulatory Visit (INDEPENDENT_AMBULATORY_CARE_PROVIDER_SITE_OTHER): Payer: Medicare Other | Admitting: Cardiovascular Disease

## 2016-09-30 ENCOUNTER — Encounter: Payer: Self-pay | Admitting: Cardiovascular Disease

## 2016-09-30 VITALS — BP 140/82 | Ht 66.5 in | Wt 141.8 lb

## 2016-09-30 DIAGNOSIS — E1151 Type 2 diabetes mellitus with diabetic peripheral angiopathy without gangrene: Secondary | ICD-10-CM

## 2016-09-30 DIAGNOSIS — E782 Mixed hyperlipidemia: Secondary | ICD-10-CM | POA: Diagnosis not present

## 2016-09-30 DIAGNOSIS — I2510 Atherosclerotic heart disease of native coronary artery without angina pectoris: Secondary | ICD-10-CM | POA: Diagnosis not present

## 2016-09-30 DIAGNOSIS — I5022 Chronic systolic (congestive) heart failure: Secondary | ICD-10-CM

## 2016-09-30 DIAGNOSIS — I10 Essential (primary) hypertension: Secondary | ICD-10-CM | POA: Diagnosis not present

## 2016-09-30 DIAGNOSIS — J449 Chronic obstructive pulmonary disease, unspecified: Secondary | ICD-10-CM | POA: Diagnosis not present

## 2016-09-30 DIAGNOSIS — Z720 Tobacco use: Secondary | ICD-10-CM

## 2016-09-30 MED ORDER — ATORVASTATIN CALCIUM 80 MG PO TABS: 80.0000 mg | ORAL_TABLET | Freq: Every day | ORAL | 3 refills | Status: DC

## 2016-09-30 MED ORDER — ENALAPRIL MALEATE 10 MG PO TABS: 10.0000 mg | ORAL_TABLET | Freq: Every day | ORAL | 3 refills | Status: DC

## 2016-09-30 MED ORDER — ISOSORBIDE MONONITRATE ER 30 MG PO TB24
30.0000 mg | ORAL_TABLET | Freq: Every day | ORAL | 3 refills | Status: DC
Start: 2016-09-30 — End: 2017-02-03

## 2016-09-30 MED ORDER — ROSUVASTATIN CALCIUM 40 MG PO TABS: 40.0000 mg | ORAL_TABLET | Freq: Every day | ORAL | 3 refills | Status: DC

## 2016-09-30 MED ORDER — EZETIMIBE 10 MG PO TABS: 10.0000 mg | ORAL_TABLET | Freq: Every day | ORAL | 3 refills | Status: DC

## 2016-09-30 MED ORDER — CARVEDILOL 3.125 MG PO TABS: 6.2500 mg | ORAL_TABLET | Freq: Two times a day (BID) | ORAL | 3 refills | Status: DC

## 2016-09-30 MED ORDER — CLOPIDOGREL BISULFATE 75 MG PO TABS
ORAL_TABLET | ORAL | 3 refills | Status: DC
Start: 2016-09-30 — End: 2017-02-03

## 2016-09-30 NOTE — Patient Instructions (Addendum)
Medication Instructions:   Please stop the lipitor Start crestor one a day Stay on zetia   Labwork:  Check cholesterol in 2 to months, fasting  Testing/Procedures:  We will order a carotid arterial ultrasound for bruit, known PAD   I recommend watching educational videos on topics of interest to you at:       [email or web address]  Enter code: HEARTCARE    Follow-Up: It was a pleasure seeing you in the office today. Please call us if you have new issues that need to be addressed before your next appt.  [phone]  Your physician wants you to follow-up in: 6 months.  You will receive a reminder letter in the mail two months in advance. If you don't receive a letter, please call our office to schedule the follow-up appointment.  If you need a refill on your cardiac medications before your next appointment, please call your pharmacy.

## 2016-10-21 ENCOUNTER — Other Ambulatory Visit: Payer: Self-pay

## 2016-10-21 ENCOUNTER — Telehealth: Payer: Self-pay | Admitting: Internal Medicine

## 2016-10-21 DIAGNOSIS — E1151 Type 2 diabetes mellitus with diabetic peripheral angiopathy without gangrene: Secondary | ICD-10-CM

## 2016-10-21 MED ORDER — INSULIN PEN NEEDLE 32G X 4 MM MISC: 1 refills | Status: DC

## 2016-10-21 MED ORDER — GLUCOSE BLOOD VI STRP: ORAL_STRIP | 5 refills | Status: DC

## 2016-10-21 NOTE — Telephone Encounter (Signed)
Patient is requesting refill on rx  glucose blood (FREESTYLE LITE) test strip and needs both sets of needles refilled. Please call patient to advise.   [facility] [address] [place] [phone] (Phone) [phone] (Fax)

## 2016-10-21 NOTE — Telephone Encounter (Signed)
Submitted

## 2016-10-23 ENCOUNTER — Telehealth: Payer: Self-pay | Admitting: Internal Medicine

## 2016-10-23 ENCOUNTER — Other Ambulatory Visit: Payer: Self-pay

## 2016-10-23 MED ORDER — INSULIN ASPART 100 UNIT/ML FLEXPEN: 12.0000 [IU] | PEN_INJECTOR | Freq: Three times a day (TID) | SUBCUTANEOUS | 3 refills | Status: DC

## 2016-10-23 MED ORDER — INSULIN GLARGINE 100 UNIT/ML SOLOSTAR PEN: 40.0000 [IU] | PEN_INJECTOR | Freq: Every day | SUBCUTANEOUS | 3 refills | Status: DC

## 2016-10-23 NOTE — Telephone Encounter (Signed)
Refill of   insulin aspart (NOVOLOG FLEXPEN) 100 UNIT/ML FlexPen  Insulin Glargine (LANTUS SOLOSTAR) 100 UNIT/ML Solostar Pen 15 mL   [facility] [address], [place] - [place] [phone] (Phone) [phone] (Fax)

## 2016-10-23 NOTE — Telephone Encounter (Signed)
Submitted

## 2016-11-12 ENCOUNTER — Ambulatory Visit: Payer: Medicare Other | Admitting: Internal Medicine

## 2016-11-24 ENCOUNTER — Telehealth: Payer: Self-pay | Admitting: *Deleted

## 2016-11-24 NOTE — Telephone Encounter (Signed)
Pt is requesting to know if she can have a Rx for 800mg  of ibuprofen. Medication is not on pts current or Hx med list. She reports she has been taking her husbands Rx and is the only thing that helps with her day to day pain.  Advised OV may be required. pls advise

## 2016-11-27 ENCOUNTER — Other Ambulatory Visit: Payer: Self-pay | Admitting: Cardiovascular Disease

## 2016-11-27 ENCOUNTER — Ambulatory Visit: Payer: Medicare Other

## 2016-11-27 DIAGNOSIS — J449 Chronic obstructive pulmonary disease, unspecified: Secondary | ICD-10-CM

## 2016-11-27 DIAGNOSIS — I2510 Atherosclerotic heart disease of native coronary artery without angina pectoris: Secondary | ICD-10-CM

## 2016-11-27 DIAGNOSIS — R0989 Other specified symptoms and signs involving the circulatory and respiratory systems: Secondary | ICD-10-CM | POA: Diagnosis not present

## 2016-11-27 DIAGNOSIS — E782 Mixed hyperlipidemia: Secondary | ICD-10-CM

## 2016-11-27 DIAGNOSIS — E1151 Type 2 diabetes mellitus with diabetic peripheral angiopathy without gangrene: Secondary | ICD-10-CM

## 2016-11-27 DIAGNOSIS — I10 Essential (primary) hypertension: Secondary | ICD-10-CM

## 2016-11-27 DIAGNOSIS — Z720 Tobacco use: Secondary | ICD-10-CM

## 2016-11-27 DIAGNOSIS — I5022 Chronic systolic (congestive) heart failure: Secondary | ICD-10-CM

## 2016-11-27 LAB — VAS US CAROTID
LEFT ECA DIAS: -17 cm/s
LEFT VERTEBRAL DIAS: -16 cm/s
Left CCA dist dias: -38 cm/s
Left CCA dist sys: -115 cm/s
Left CCA prox dias: 16 cm/s
Left CCA prox sys: 77 cm/s
Left ICA dist dias: -38 cm/s
Left ICA dist sys: -126 cm/s
Left ICA prox dias: 14 cm/s
Left ICA prox sys: 37 cm/s
RIGHT ECA DIAS: -15 cm/s
RIGHT VERTEBRAL DIAS: -20 cm/s
Right CCA prox dias: 15 cm/s
Right CCA prox sys: 65 cm/s
Right cca dist sys: -86 cm/s

## 2016-11-28 ENCOUNTER — Other Ambulatory Visit: Payer: Self-pay

## 2016-11-28 ENCOUNTER — Telehealth: Payer: Self-pay | Admitting: Internal Medicine

## 2016-11-28 MED ORDER — LEVOTHYROXINE SODIUM 125 MCG PO TABS: 125.0000 ug | ORAL_TABLET | Freq: Every day | ORAL | 1 refills | Status: DC

## 2016-11-28 NOTE — Telephone Encounter (Signed)
Submitted to pharmacy 

## 2016-11-28 NOTE — Telephone Encounter (Signed)
Refill of   levothyroxine (SYNTHROID, LEVOTHROID) 125 MCG tablet Insulin Glargine (LANTUS SOLOSTAR) 100 UNIT/ML Solostar Pen 15 mL   Send to  Insulin Glargine (LANTUS SOLOSTAR) 100 UNIT/ML Solostar Pen 15 mL

## 2016-11-30 IMAGING — DX DG ABDOMEN 1V
1 series · 1 of 1 positions shown · non-contrast
Comparison: CT 02/06/2016

CLINICAL DATA: CVA tenderness

EXAM:
ABDOMEN - 1 VIEW

[abdomen kub]
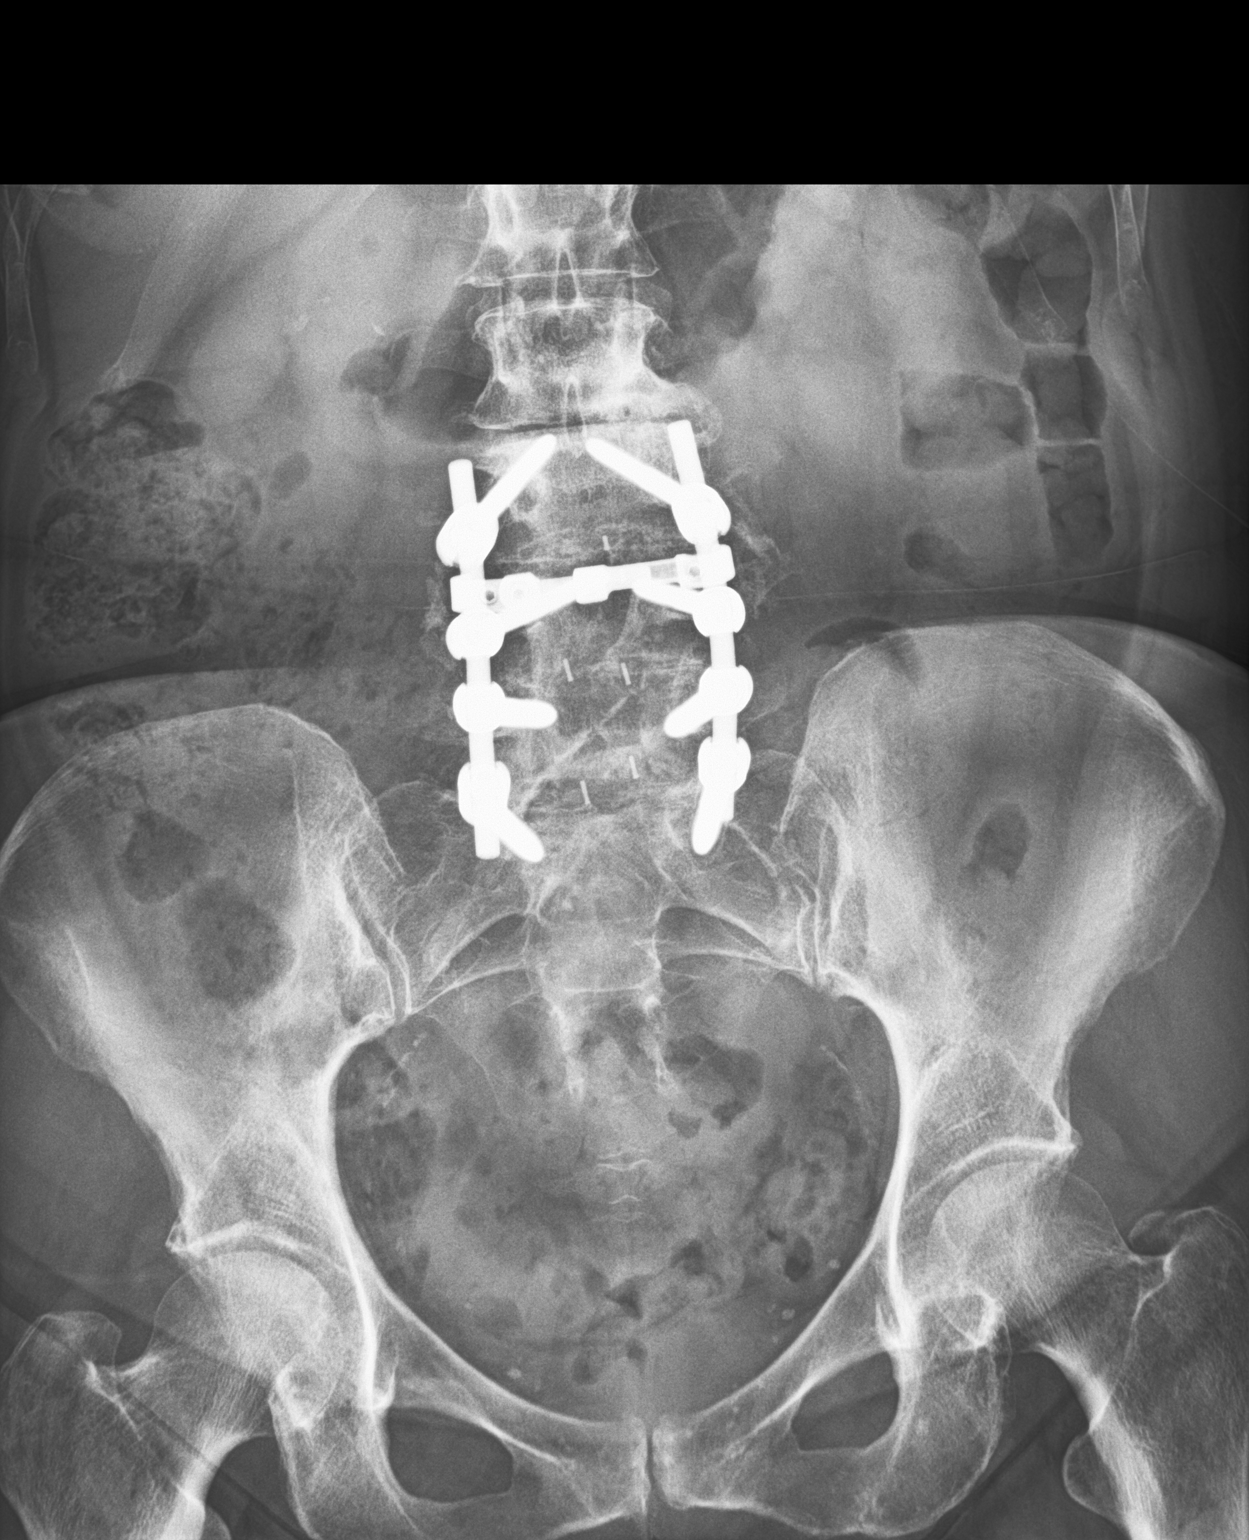

[1 of 1 positions shown; findings below may reference images not displayed]

FINDINGS: Calcification projecting of the right kidney were shown to be
vascular atherosclerotic calcifications on prior CT. Nonobstructive
bowel gas pattern. No suspicious calcifications or organomegaly. No
free air. Postoperative changes in the lower lumbar spine from
posterior fusion. No acute bony abnormality.
IMPRESSION: No acute findings.

## 2016-12-16 ENCOUNTER — Other Ambulatory Visit: Payer: Self-pay

## 2016-12-16 MED ORDER — INSULIN GLARGINE 100 UNIT/ML SOLOSTAR PEN: 40.0000 [IU] | PEN_INJECTOR | Freq: Every day | SUBCUTANEOUS | 3 refills | Status: DC

## 2016-12-16 NOTE — Telephone Encounter (Signed)
Patient states that pharmacy did not get the Lantus rx. The pharmacy did get the levothyroxine. Please resend.

## 2016-12-16 NOTE — Telephone Encounter (Signed)
Submitted the Lantus

## 2017-01-01 IMAGING — DX DG LUMBAR SPINE COMPLETE 4+V
5 series · 5 of 5 positions shown · non-contrast
Comparison: Abdominal radiograph 04/04/2016

CLINICAL DATA: Low back pain

EXAM:
LUMBAR SPINE - COMPLETE 4+ VIEW

[l-spine ap]
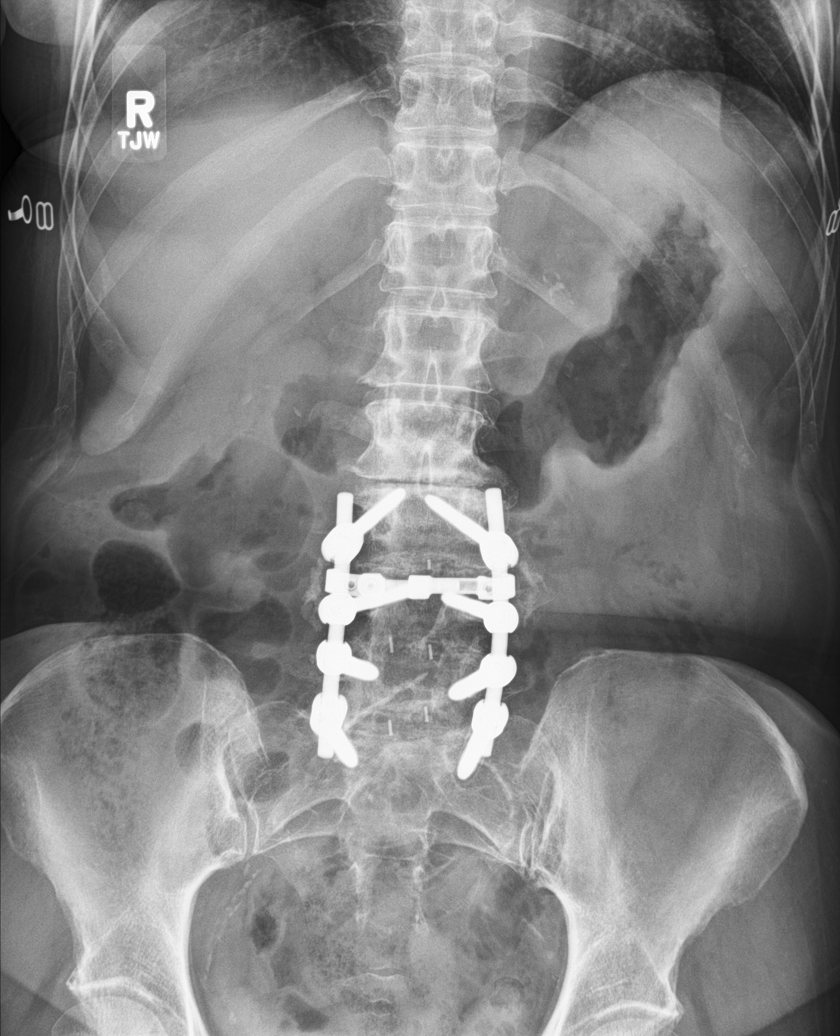

[l-spine obl (1 of 2)]
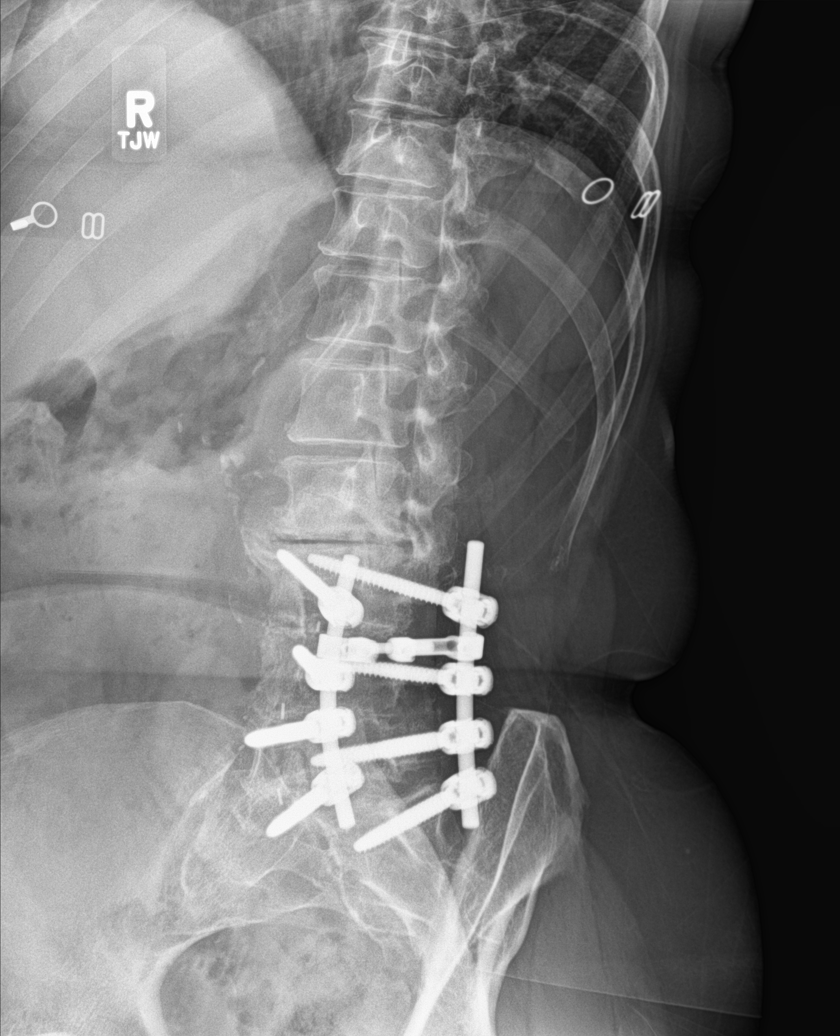

[l-spine obl (2 of 2)]
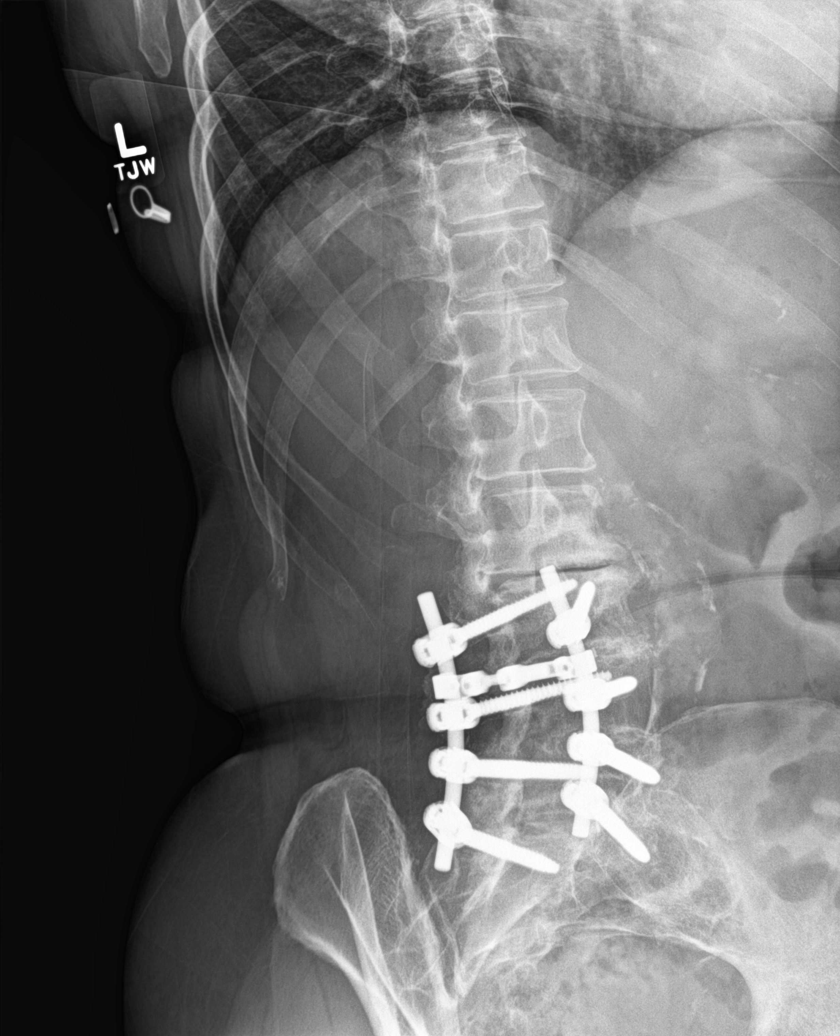

[l-spine lat]
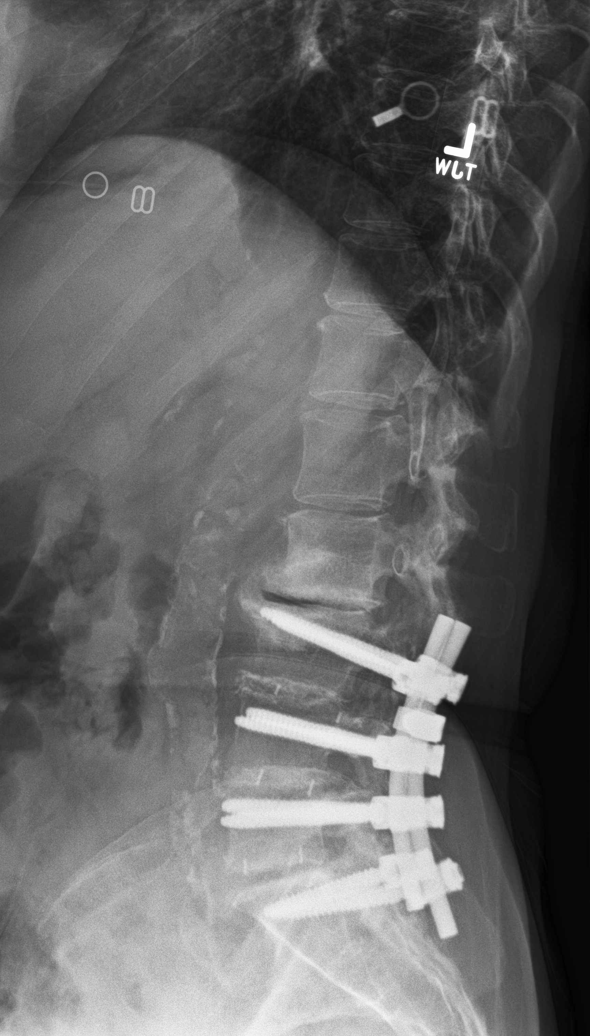

[l-spine l5/s1]
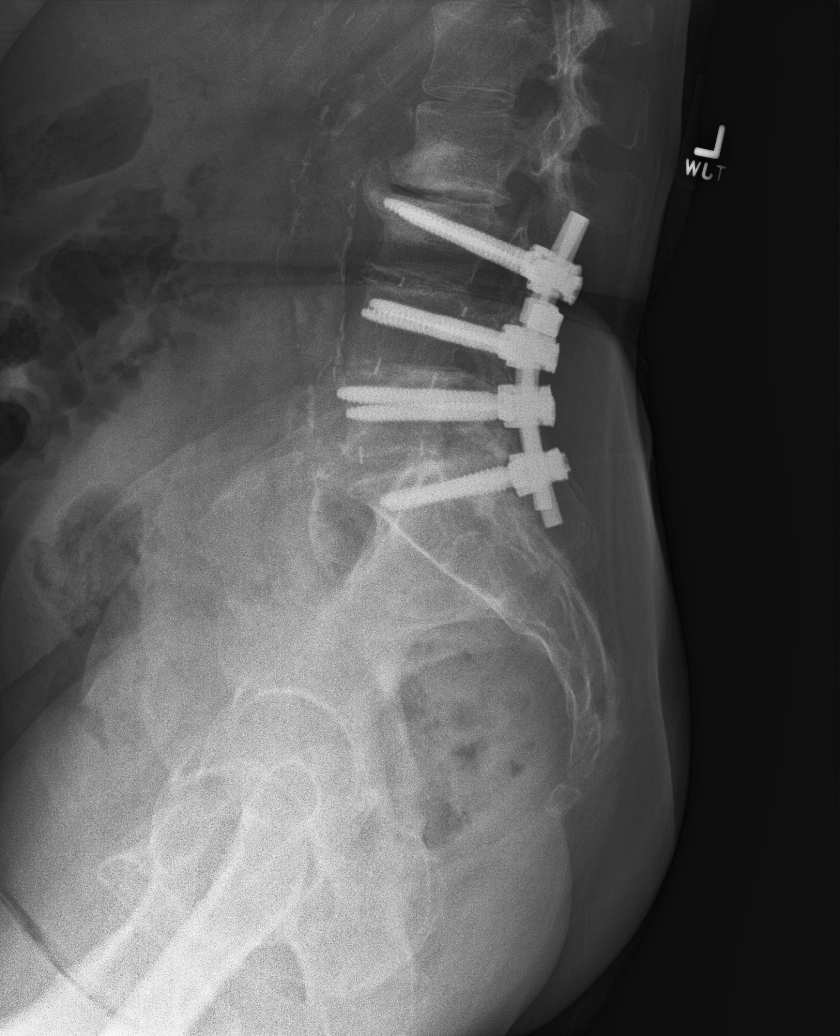

[5 of 5 positions shown; findings below may reference images not displayed]

FINDINGS: There is posterior fusion hardware extending from L3-S1 with
bilateral transpedicular screws and spinal rods, with a crossbar at
the L3-4 level. There is disc spacer material at L3-4, L4-5 and
L5-S1. Alignment at diffuse segments is normal. There is no clear
evidence of hardware loosening or infection.

There is marked narrowing of the intervertebral disc space at L2-L3
with associated formation of an anterior osteophyte. No acute
fracture. The sacroiliac joints and visualized pelvic structures are
unremarkable.
IMPRESSION: 1. Posterior spinal fusion from L3-S1 in normal alignment. No
hardware abnormality.
2. Adjacent segment disease at L2-L3 with disc space narrowing,
osteophyte formation and endplate sclerosis.

## 2017-01-08 ENCOUNTER — Other Ambulatory Visit: Payer: Self-pay

## 2017-01-08 ENCOUNTER — Telehealth: Payer: Self-pay | Admitting: Internal Medicine

## 2017-01-08 MED ORDER — INSULIN ASPART 100 UNIT/ML FLEXPEN: 12.0000 [IU] | PEN_INJECTOR | Freq: Three times a day (TID) | SUBCUTANEOUS | 3 refills | Status: DC

## 2017-01-08 MED ORDER — INSULIN GLARGINE 100 UNIT/ML SOLOSTAR PEN: 40.0000 [IU] | PEN_INJECTOR | Freq: Every day | SUBCUTANEOUS | 3 refills | Status: DC

## 2017-01-08 MED ORDER — LEVOTHYROXINE SODIUM 125 MCG PO TABS: 125.0000 ug | ORAL_TABLET | Freq: Every day | ORAL | 1 refills | Status: DC

## 2017-01-08 MED ORDER — INSULIN PEN NEEDLE 32G X 4 MM MISC: 1 refills | Status: DC

## 2017-01-08 NOTE — Telephone Encounter (Signed)
Made in error

## 2017-01-12 ENCOUNTER — Ambulatory Visit (INDEPENDENT_AMBULATORY_CARE_PROVIDER_SITE_OTHER): Payer: Medicare Other | Admitting: Family Medicine

## 2017-01-12 ENCOUNTER — Encounter: Payer: Self-pay | Admitting: Family Medicine

## 2017-01-12 DIAGNOSIS — G4700 Insomnia, unspecified: Secondary | ICD-10-CM | POA: Diagnosis not present

## 2017-01-12 DIAGNOSIS — I2510 Atherosclerotic heart disease of native coronary artery without angina pectoris: Secondary | ICD-10-CM

## 2017-01-12 MED ORDER — TRAZODONE HCL 100 MG PO TABS: 200.0000 mg | ORAL_TABLET | Freq: Every day | ORAL | 3 refills | Status: DC

## 2017-01-12 NOTE — Assessment & Plan Note (Signed)
>  25 min spent with patient, at least half of which was spent on counseling insomnia.  The problem of recurrent insomnia is discussed. Avoidance of caffeine sources is strongly encouraged. Sleep hygiene issues are reviewed.  Trazodone eRx refills sent. Call or return to clinic prn if these symptoms worsen or fail to improve as anticipated. The patient indicates understanding of these issues and agrees with the plan.

## 2017-01-12 NOTE — Progress Notes (Signed)
Subjective:   Patient ID: Sarah Payne, female    DOB: 1946/03/29, 71 y.o.   MRN: [id number]  JEFFRIE STANDER is a pleasant 71 y.o. year old female who presents to clinic today with Personal Problem  on 01/12/2017  HPI:   Insomnia- ran out of her trazodone. Was taking 200 mg qhs and felt it was very effective.  Wants to restart this.  Under more stress lately with her husband's failing health.  Denies feeling depressed. No SI or HI.  She is anxious but feels sleeping better would help with this as welll.  Current Outpatient Prescriptions on File Prior to Visit  Medication Sig Dispense Refill  . ALPRAZolam (XANAX) 0.25 MG tablet Take 1 tablet (0.25 mg total) by mouth 2 (two) times daily as needed for anxiety. 20 tablet 0  . aspirin 81 MG tablet Take 81 mg by mouth daily.      . Calcium & Magnesium Carbonates (MYLANTA PO) Take 15 mLs by mouth as needed (for indegestion).     . carvedilol (COREG) 3.125 MG tablet Take 2 tablets (6.25 mg total) by mouth 2 (two) times daily with a meal. 360 tablet 3  . clopidogrel (PLAVIX) 75 MG tablet TAKE 1 TABLET (75 MG TOTAL) BY MOUTH DAILY. 90 tablet 3  . enalapril (VASOTEC) 10 MG tablet Take 1 tablet (10 mg total) by mouth daily. 90 tablet 3  . ezetimibe (ZETIA) 10 MG tablet Take 1 tablet (10 mg total) by mouth daily. 90 tablet 3  . glucose blood (FREESTYLE LITE) test strip Check blood sugar 5 times daily to check blood sugars - Uncontrolled diabetes mellitus 400 each 5  . glucose monitoring kit (FREESTYLE) monitoring kit 1 each by Does not apply route as needed for other. PHARMACY PLEASE SUBSTITUTE WHATEVER GLUCOMETER YOU CARRY 1 each 1  . insulin aspart (NOVOLOG FLEXPEN) 100 UNIT/ML FlexPen Inject 12-14 Units into the skin 3 (three) times daily with meals. Normally takes 12 units 15 mL 3  . Insulin Glargine (LANTUS SOLOSTAR) 100 UNIT/ML Solostar Pen Inject 40 Units into the skin at bedtime. 15 mL 3  . Insulin Pen Needle (INSUPEN PEN NEEDLES) 32G X  4 MM MISC Use to inject insulin 4 times daily. 250 each 1  . isosorbide mononitrate (IMDUR) 30 MG 24 hr tablet Take 1 tablet (30 mg total) by mouth daily. 90 tablet 3  . lansoprazole (PREVACID) 15 MG capsule Take 15 mg by mouth daily. Reported on 11/06/2015    . levothyroxine (SYNTHROID, LEVOTHROID) 125 MCG tablet Take 1 tablet (125 mcg total) by mouth daily. 45 tablet 1  . magnesium 30 MG tablet Take 30 mg by mouth daily. Reported on 09/20/2015    . methocarbamol (ROBAXIN) 500 MG tablet Take 1 tablet (500 mg total) by mouth every 8 (eight) hours as needed for muscle spasms. 15 tablet 0  . ondansetron (ZOFRAN) 4 MG tablet Take 1 tab every 6 hours as needed for nausea. 30 tablet 2  . oxyCODONE-acetaminophen (ROXICET) 5-325 MG tablet Take 1 tablet by mouth every 4 (four) hours as needed for severe pain. 20 tablet 0  . pantoprazole (PROTONIX) 40 MG tablet Take 1 tablet (40 mg total) by mouth daily. 90 tablet 3  . rosuvastatin (CRESTOR) 40 MG tablet Take 1 tablet (40 mg total) by mouth daily. 90 tablet 3  . sitaGLIPtin (JANUVIA) 100 MG tablet Take 1 tablet (100 mg total) by mouth daily. 90 tablet 3   No current facility-administered medications on file  prior to visit.     Allergies  Allergen Reactions  . No Known Allergies     Past Medical History:  Diagnosis Date  . Cataract    BILATERAL REMOVED  . Chronic low back pain   . COPD (chronic obstructive pulmonary disease) ([place])   . Coronary artery disease    a. multiple PCIs to the mLCx. b. stent to dRCA 08/2011 in setting of NSTEMI. c. DES to prox-mid LCx for ISR 09/2011. d.  DESx2 to prox-mRCA 07/2012. e. Takotsubo event 12/2015 with patent stents.  . Depression   . Diabetes mellitus   . Diverticulosis   . Frequent PVCs    a. Noted in hospital 12/2015.  Marland Kitchen GERD (gastroesophageal reflux disease)   . Hyperlipidemia   . Hypertension   . Hypothyroidism   . Impingement syndrome of left shoulder   . Marijuana abuse   . Myocardial infarction ([place])     x 5  . Sleep apnea    mild-does not use cpap  . Takotsubo cardiomyopathy    a. 12/2015 - nephew committed suicide 1 week prior, sister died the morning of presentation - initially called a STEMI; cath with patent stents. LVEF 25-30%.  . Tendonitis of left rotator cuff   . Tobacco abuse   . Vascular dementia     Past Surgical History:  Procedure Laterality Date  . ABDOMINAL HYSTERECTOMY    . APPENDECTOMY    . BACK SURGERY    . CARDIAC CATHETERIZATION  2013  . CARDIAC CATHETERIZATION  2016  . CARDIAC CATHETERIZATION N/A 01/01/2016   Procedure: Left Heart Cath and Coronary Angiography;  Surgeon: Jettie Booze, MD;  Location: [facility];  Service: Cardiovascular;  Laterality: N/A;  . CATARACT EXTRACTION W/PHACO Left 01/30/2015   Procedure: CATARACT EXTRACTION PHACO AND INTRAOCULAR LENS PLACEMENT (IOC);  Surgeon: Birder Robson, MD;  Location: [facility];  Service: Ophthalmology;  Laterality: Left;  [place] 00:47   . CATARACT EXTRACTION W/PHACO Right 02/13/2015   Procedure: CATARACT EXTRACTION PHACO AND INTRAOCULAR LENS PLACEMENT (IOC);  Surgeon: Birder Robson, MD;  Location: [facility];  Service: Ophthalmology;  Laterality: Right;  cassette lot # [id number] H [place]  00:29.9 AP  20.7 CDE  6.20  . COLONOSCOPY N/A 11/02/2014   Procedure: COLONOSCOPY;  Surgeon: Inda Castle, MD;  Location: [facility];  Service: Endoscopy;  Laterality: N/A;  . CORONARY ANGIOPLASTY  2012   stent x 3   . CORONARY ANGIOPLASTY WITH STENT PLACEMENT  2013    Family History  Problem Relation Age of Onset  . Heart attack Mother        First MI @ 29 - Died @ 58  . Heart disease Mother   . Heart disease Father        Died @ 69  . Throat cancer Brother   . Liver cancer Brother   . Colon cancer Sister     Social History   Social History  . Marital status: Married    Spouse name: N/A  . Number of children: N/A  . Years of education: N/A   Occupational History  . Not on file.   Social History  Main Topics  . Smoking status: Current Every Day Smoker    Packs/day: 1.00    Years: 45.00    Types: Cigarettes  . Smokeless tobacco: Never Used     Comment: Has cut back, trying to quit.   . Alcohol use No  . Drug use: Yes    Types: Marijuana  Comment: last night 4.13.16  . Sexual activity: Not on file   Other Topics Concern  . Not on file   Social History Narrative   Lives at home with her husband in [place].  Previously used marijuana - quit.      Regular exercise: no/ pain from a frozen rotator cuff   Caffeine use: coffee daily and pepsi      Does not have a living will.   Daughters and husband know her wishes- would desire CPR but not prolonged life support if futile   The PMH, PSH, Social History, Family History, Medications, and allergies have been reviewed in [facility], and have been updated if relevant.   Review of Systems  Psychiatric/Behavioral: Positive for sleep disturbance. Negative for agitation, behavioral problems, confusion, decreased concentration, dysphoric mood, hallucinations, self-injury and suicidal ideas. The patient is nervous/anxious. The patient is not hyperactive.   All other systems reviewed and are negative.      Objective:    BP (!) 142/88 (BP Location: Left Arm, Patient Position: Sitting, Cuff Size: Normal)   Pulse 75   Temp 97.4 F (36.3 C) (Oral)   Wt 142 lb 4 oz (64.5 kg)   SpO2 96%   BMI 22.62 kg/m    Physical Exam  Constitutional: She is oriented to person, place, and time. She appears well-developed and well-nourished. No distress.  HENT:  Head: Normocephalic and atraumatic.  Eyes: Conjunctivae are normal.  Cardiovascular: Normal rate.   Pulmonary/Chest: Effort normal.  Musculoskeletal: Normal range of motion.  Neurological: She is alert and oriented to person, place, and time. No cranial nerve deficit.  Skin: Skin is warm and dry. She is not diaphoretic.  Psychiatric: She has a normal mood and affect. Her behavior is  normal. Judgment and thought content normal.  Nursing note and vitals reviewed.         Assessment & Plan:   Insomnia, unspecified type No Follow-up on file.

## 2017-01-26 ENCOUNTER — Telehealth: Payer: Self-pay | Admitting: Internal Medicine

## 2017-01-26 ENCOUNTER — Telehealth: Payer: Self-pay

## 2017-01-26 NOTE — Telephone Encounter (Signed)
Called home number, no answer and no voicemail. Called the mobile number on file, it was the daughters number I left a message explaining that I needed to discuss insulin changes with Ms.Carandang for the patient to call us back to discuss.  Gave call back number.

## 2017-01-26 NOTE — Telephone Encounter (Signed)
Attempted to contact patient again. No answer, and no voicemail to leave a message. Will try again.

## 2017-01-26 NOTE — Telephone Encounter (Signed)
Called home number, no answer and no voicemail. Called the mobile number on file, it was the daughters number I left a message explaining that I needed to discuss insulin changes with Ms.Mans for the patient to call us back to discuss.  Gave call back number.

## 2017-01-26 NOTE — Telephone Encounter (Signed)
Please advise. Thank you

## 2017-01-26 NOTE — Telephone Encounter (Signed)
Sarah Payne, can you please call pt:  Please increase: - Lantus to 40 units at bedtime - Novolog to: 10 units before a smaller meal 12-14 units before a regular meal   16 units before a larger meal She also needs to schedule an appt.

## 2017-01-26 NOTE — Telephone Encounter (Signed)
BS have been consistently higher, she has been going thru a lot of stress  7/8 255 AM 7/4 334 AM, after lunch 117, before dinner 210; before bed it was 263 7/6 341 AM; after lunch 258;  7/7 159 AM 7/9 right now 432  Scattered, sorry

## 2017-01-27 ENCOUNTER — Telehealth: Payer: Self-pay

## 2017-01-27 MED ORDER — INSULIN ASPART 100 UNIT/ML FLEXPEN: 12.0000 [IU] | PEN_INJECTOR | Freq: Three times a day (TID) | SUBCUTANEOUS | 3 refills | Status: DC

## 2017-01-27 MED ORDER — LEVOTHYROXINE SODIUM 125 MCG PO TABS: 125.0000 ug | ORAL_TABLET | Freq: Every day | ORAL | 1 refills | Status: DC

## 2017-01-27 MED ORDER — INSULIN GLARGINE 100 UNIT/ML SOLOSTAR PEN: 40.0000 [IU] | PEN_INJECTOR | Freq: Every day | SUBCUTANEOUS | 3 refills | Status: DC

## 2017-01-27 NOTE — Telephone Encounter (Signed)
Called and notified patient of changes in insulin, patient made appointment at next available. Patient will call if she has any other issues.

## 2017-02-03 ENCOUNTER — Telehealth: Payer: Self-pay | Admitting: Cardiovascular Disease

## 2017-02-03 ENCOUNTER — Other Ambulatory Visit: Payer: Self-pay | Admitting: *Deleted

## 2017-02-03 MED ORDER — EZETIMIBE 10 MG PO TABS: 10.0000 mg | ORAL_TABLET | Freq: Every day | ORAL | 3 refills | Status: DC

## 2017-02-03 MED ORDER — ENALAPRIL MALEATE 10 MG PO TABS: 10.0000 mg | ORAL_TABLET | Freq: Every day | ORAL | 3 refills | Status: DC

## 2017-02-03 MED ORDER — ISOSORBIDE MONONITRATE ER 30 MG PO TB24: 30.0000 mg | ORAL_TABLET | Freq: Every day | ORAL | 3 refills | Status: DC

## 2017-02-03 MED ORDER — CARVEDILOL 3.125 MG PO TABS: 6.2500 mg | ORAL_TABLET | Freq: Two times a day (BID) | ORAL | 3 refills | Status: DC

## 2017-02-03 MED ORDER — PANTOPRAZOLE SODIUM 40 MG PO TBEC: 40.0000 mg | DELAYED_RELEASE_TABLET | Freq: Every day | ORAL | 3 refills | Status: DC

## 2017-02-03 MED ORDER — CLOPIDOGREL BISULFATE 75 MG PO TABS: ORAL_TABLET | ORAL | 3 refills | Status: DC

## 2017-02-03 MED ORDER — ROSUVASTATIN CALCIUM 40 MG PO TABS: 40.0000 mg | ORAL_TABLET | Freq: Every day | ORAL | 3 refills | Status: DC

## 2017-02-03 NOTE — Telephone Encounter (Signed)
Requested Prescriptions   Signed Prescriptions Disp Refills  . carvedilol (COREG) 3.125 MG tablet 360 tablet 3    Sig: Take 2 tablets (6.25 mg total) by mouth 2 (two) times daily with a meal.    Authorizing Provider: Minna Merritts    Ordering User: LOPEZ, MARINA C  . clopidogrel (PLAVIX) 75 MG tablet 90 tablet 3    Sig: TAKE 1 TABLET (75 MG TOTAL) BY MOUTH DAILY.    Authorizing Provider: Minna Merritts    Ordering User: Eugenio Hoes, MARINA C  . enalapril (VASOTEC) 10 MG tablet 90 tablet 3    Sig: Take 1 tablet (10 mg total) by mouth daily.    Authorizing Provider: Minna Merritts    Ordering User: Othelia Pulling C  . ezetimibe (ZETIA) 10 MG tablet 90 tablet 3    Sig: Take 1 tablet (10 mg total) by mouth daily.    Authorizing Provider: Minna Merritts    Ordering User: Britt Bottom  . isosorbide mononitrate (IMDUR) 30 MG 24 hr tablet 90 tablet 3    Sig: Take 1 tablet (30 mg total) by mouth daily.    Authorizing Provider: Minna Merritts    Ordering User: Eugenio Hoes, MARINA C  . rosuvastatin (CRESTOR) 40 MG tablet 90 tablet 3    Sig: Take 1 tablet (40 mg total) by mouth daily.    Authorizing Provider: Minna Merritts    Ordering User: Eugenio Hoes, MARINA C  . pantoprazole (PROTONIX) 40 MG tablet 90 tablet 3    Sig: Take 1 tablet (40 mg total) by mouth daily.    Authorizing Provider: Minna Merritts    Ordering User: Britt Bottom

## 2017-02-03 NOTE — Telephone Encounter (Signed)
Pt calling asking if we can send in refills on her medication all the ones we prescribed to [facility] Please advise.

## 2017-02-03 NOTE — Telephone Encounter (Signed)
Requested Prescriptions   Signed Prescriptions Disp Refills  . carvedilol (COREG) 3.125 MG tablet 360 tablet 3    Sig: Take 2 tablets (6.25 mg total) by mouth 2 (two) times daily with a meal.    Authorizing Provider: Minna Merritts    Ordering User: Sharmel Ballantine C  . clopidogrel (PLAVIX) 75 MG tablet 90 tablet 3    Sig: TAKE 1 TABLET (75 MG TOTAL) BY MOUTH DAILY.    Authorizing Provider: Minna Merritts    Ordering User: Eugenio Hoes, Awilda Covin C  . enalapril (VASOTEC) 10 MG tablet 90 tablet 3    Sig: Take 1 tablet (10 mg total) by mouth daily.    Authorizing Provider: Minna Merritts    Ordering User: Othelia Pulling C  . ezetimibe (ZETIA) 10 MG tablet 90 tablet 3    Sig: Take 1 tablet (10 mg total) by mouth daily.    Authorizing Provider: Minna Merritts    Ordering User: Britt Bottom  . isosorbide mononitrate (IMDUR) 30 MG 24 hr tablet 90 tablet 3    Sig: Take 1 tablet (30 mg total) by mouth daily.    Authorizing Provider: Minna Merritts    Ordering User: Eugenio Hoes, Evangelene Vora C  . rosuvastatin (CRESTOR) 40 MG tablet 90 tablet 3    Sig: Take 1 tablet (40 mg total) by mouth daily.    Authorizing Provider: Minna Merritts    Ordering User: Eugenio Hoes, Lauree Yurick C  . pantoprazole (PROTONIX) 40 MG tablet 90 tablet 3    Sig: Take 1 tablet (40 mg total) by mouth daily.    Authorizing Provider: Minna Merritts    Ordering User: Britt Bottom

## 2017-04-13 ENCOUNTER — Ambulatory Visit: Payer: Self-pay | Admitting: Internal Medicine

## 2017-04-13 DIAGNOSIS — Z0289 Encounter for other administrative examinations: Secondary | ICD-10-CM

## 2017-04-17 ENCOUNTER — Encounter: Payer: Self-pay | Admitting: Internal Medicine

## 2017-04-17 ENCOUNTER — Ambulatory Visit (INDEPENDENT_AMBULATORY_CARE_PROVIDER_SITE_OTHER): Payer: Medicare Other | Admitting: Internal Medicine

## 2017-04-17 ENCOUNTER — Other Ambulatory Visit: Payer: Self-pay

## 2017-04-17 VITALS — BP 140/88 | HR 71 | Ht 66.0 in | Wt 144.0 lb

## 2017-04-17 DIAGNOSIS — E1151 Type 2 diabetes mellitus with diabetic peripheral angiopathy without gangrene: Secondary | ICD-10-CM

## 2017-04-17 DIAGNOSIS — Z23 Encounter for immunization: Secondary | ICD-10-CM

## 2017-04-17 DIAGNOSIS — I2510 Atherosclerotic heart disease of native coronary artery without angina pectoris: Secondary | ICD-10-CM

## 2017-04-17 DIAGNOSIS — E039 Hypothyroidism, unspecified: Secondary | ICD-10-CM | POA: Diagnosis not present

## 2017-04-17 LAB — POCT GLYCOSYLATED HEMOGLOBIN (HGB A1C): Hemoglobin A1C: 11

## 2017-04-17 LAB — T4, FREE: Free T4: 0.78 ng/dL (ref 0.60–1.60)

## 2017-04-17 LAB — TSH: TSH: 2.38 u[IU]/mL (ref 0.35–4.50)

## 2017-04-17 MED ORDER — INSULIN ASPART 100 UNIT/ML FLEXPEN: 12.0000 [IU] | PEN_INJECTOR | Freq: Three times a day (TID) | SUBCUTANEOUS | 3 refills | Status: DC

## 2017-04-17 MED ORDER — LEVOTHYROXINE SODIUM 125 MCG PO TABS: 125.0000 ug | ORAL_TABLET | Freq: Every day | ORAL | 3 refills | Status: DC

## 2017-04-17 MED ORDER — SITAGLIPTIN PHOSPHATE 100 MG PO TABS: 100.0000 mg | ORAL_TABLET | Freq: Every day | ORAL | 3 refills | Status: DC

## 2017-04-17 MED ORDER — INSULIN GLARGINE 100 UNIT/ML SOLOSTAR PEN: 40.0000 [IU] | PEN_INJECTOR | Freq: Every day | SUBCUTANEOUS | 3 refills | Status: DC

## 2017-04-17 NOTE — Progress Notes (Signed)
Patient ID: Sarah Payne, female   DOB: Oct 17, 1945, 71 y.o.   MRN: [id number]  HPI: [name] is a 71 y.o.-year-old female, returning for f/u for DM2, dx 2003, insulin-dependent since 2013, uncontrolled, with complications (CAD, sCHF, PAD, CKD, PN) and also complicated by dementia and medication noncompliance, also hypothyroidism, uncontrolled. Last visit 6 mo ago.  She is not compliant with her meds and appts.  Her husband had a stroke and sister died 2 weeks ago from cancer.  DM2: Last hemoglobin A1c still high: Lab Results  Component Value Date   HGBA1C 10.4 (H) 09/16/2016   HGBA1C 11.7 (H) 06/02/2016   HGBA1C 11.2 (H) 02/06/2016   Pt is on a regimen of: - Januvia 100 mg in am, before b'fast >> stopped... Unclear why? - Lantus 40 units at bedtime  - Novolog: she was off this for 2 weeks, restarted 1 mo ago 10 units before a smaller meal 12-14 units before a regular meal   16 units before a larger meal If sugars before the meal are: 70 or lower, do not take insulin before that meal 71- 99, take no more than 6 units with that meal 100 or above, take the whole dose of insulin  Pt checks her sugars 1-2x a day - downloaded meter: - am:110-338, 376 >> 80 x1, 100 x1, 170-267 >> 175, 220-257, 300 - 2h after b'fast: 340 >> n/c >> 245-400 >> n/c  - before lunch:   219 >> 57 (correction of 400), 137-256 >> 124, 266, 368 - 2h after lunch:   57, 100-319 >> 69 x1, 70 x1, 93-298 >> n/c - before dinner: 68, 169-278 >> 74-299 >> 56, 74-222 >> 179-230 - 2h after dinner: n/c >> 77-277 >> 94-240 >> 55-285 >> n/c - bedtime: 273-352 >> 62, 126-296 >> 69, 150-242 >> n/c - nighttime: 54, 58, 145 >> 270 >> n/c Lowest sugar was 57 x1 >> 55 >> 79  Highest sugar was 376 >> 400 >> 398  - + CKD, last BUN/creatinine:  Lab Results  Component Value Date   BUN 15 06/02/2016   CREATININE 1.04 06/02/2016  On Enalapril. - + HL; last set of lipids: Lab Results  Component Value Date   CHOL 301 (H)  06/02/2016   HDL 45.10 06/02/2016   LDLCALC 146 (H) 03/12/2015   LDLDIRECT 207.0 06/02/2016   TRIG 387.0 (H) 06/02/2016   CHOLHDL 7 06/02/2016  She is on Lipitor and Zetia. She is on ASA 81 - last eye exam was in 2016 >> No DR She had cataract sx. - she has numbness and tingling in her feet.  Hypothyroidism: She is on levothyroxine 125 mcg daily >> decreased dose at last visit >> did not return for repeat labs...  She takes the LT4: - in am - fasting - at least 30 min from b'fast - no Fe, MVI - no calcium, acid reflux medicines  - not on Biotin  Latest TSH was suppressed: Lab Results  Component Value Date   TSH 0.07 (L) 09/18/2016   TSH 0.36 04/17/2016   TSH 0.24 (L) 02/06/2016   TSH 51.35 (H) 09/20/2015   TSH 0.52 06/18/2015   TSH 53.23 (H) 06/01/2015   TSH 40.79 (H) 03/12/2015   TSH 53.83 (H) 12/21/2014   TSH 60.34 (H) 08/16/2014   TSH > 100 08/02/2014   FREET4 0.97 09/18/2016   FREET4 1.21 04/17/2016   FREET4 2.16 (H) 02/06/2016   FREET4 0.27 (L) 09/20/2015   FREET4 0.57 (L)  03/12/2015   FREET4 0.21 (L) 12/21/2014   FREET4 0.30 (L) 08/16/2014   FREET4 1.03 04/26/2014   FREET4 1.58 12/05/13   Her sister died, her nephew killed himself and she had an AMI in summer 2017.  ROS: Constitutional: no weight gain/no weight loss, no fatigue, no subjective hyperthermia, no subjective hypothermia Eyes: no blurry vision, no xerophthalmia ENT: no sore throat, no nodules palpated in throat, no dysphagia, no odynophagia, no hoarseness Cardiovascular: no CP/no SOB/no palpitations/no leg swelling Respiratory: no cough/no SOB/no wheezing Gastrointestinal: no N/no V/no D/no C/no acid reflux Musculoskeletal: no muscle aches/no joint aches Skin: no rashes, no hair loss Neurological: no tremors/no numbness/no tingling/no dizziness  I reviewed pt's medications, allergies, PMH, social hx, family hx, and changes were documented in the history of present illness. Otherwise,  unchanged from my initial visit note.  PE: BP 140/88 (BP Location: Left Arm, Patient Position: Sitting)   Pulse 71   Ht 5\' 6"  (1.676 m)   Wt 144 lb (65.3 kg)   SpO2 97%   BMI 23.24 kg/m  Body mass index is 23.24 kg/m. Wt Readings from Last 3 Encounters:  04/17/17 144 lb (65.3 kg)  01/12/17 142 lb 4 oz (64.5 kg)  09/30/16 141 lb 12 oz (64.3 kg)   Constitutional: normal weight, in NAD Eyes: PERRLA, EOMI, no exophthalmos ENT: moist mucous membranes, no thyromegaly, no cervical lymphadenopathy Cardiovascular: RRR, No MRG Respiratory: CTA B Gastrointestinal: abdomen soft, NT, ND, BS+ Musculoskeletal: no deformities, strength intact in all 4 Skin: moist, warm, no rashes Neurological: no tremor with outstretched hands, DTR normal in all 4  ASSESSMENT: 1. DM2, insulin-dependent, uncontrolled, with complications - med noncompliance (dementia) - CAD - had 3 AMI in 2011 >> 7 stents placed; new AMI 02/29/2016 after her sister's death - sCHF - PAD - CKD - PN  2. Hypothyroidism - Uncontrolled  PLAN:  1. Patient with long-standing uncontrolled diabetes, With history of medication noncompliance and also noncompliant with the appointments. She returns after a long absence. Sugars are higher. She had a period of time off her rapid acting insulin >> sugars much worse, and they have not increased significantly yet. We will refill all her medicines today for a year, so she does not run out anymore. We will also increase NovoLog doses and add back Januvia (she does not remember that she was supposed to be on this).  - I suggested to:  Patient Instructions  Please continue: - Lantus 40 units at bedtime  Please restart: - Januvia 100 mg in am, before b'fast  Please increase: - Novolog: 14 units before a smaller meal 18 units before a regular meal   22 units before a larger meal or if you have dessert  If sugars before the meal are: 70 or lower, do not take insulin before that meal 71-  99, take no more than 8 units with that meal 100 or above, take the whole dose of insulin  Do not skip insulin doses.  Please stop at the lab.  Please return in 3 months with your sugar log.    - today, HbA1c is 11% (higher) - continue checking sugars at different times of the day - check 3x a day, rotating checks - advised for yearly eye exams >> she needs one - will give her the flu shot today - will refill her Rx's - Return to clinic in 3 mo with sugar log   2. Hypothyroidism - Patient's thyroid tests have been very uncontrolled, and this  could have been from poor medication compliance +/- taking her levothyroxine along with calcium and PPIs. Now off these and not missing doses - latest thyroid labs reviewed with pt >> TSH was low >> we decreased her LT4 dose to 125 mcg daily - pt feels good on this dose. - we discussed about taking the thyroid hormone every day, with water, >30 minutes before breakfast, separated by >4 hours from acid reflux medications, calcium, iron, multivitamins. Pt. is taking it correctly now. - will check thyroid tests today: TSH and fT4 - If labs are abnormal, she will need to return for repeat TFTs in 1.5 months - OTW, RTC in 3 mo  Office Visit on 04/17/2017  Component Date Value Ref Range Status  . Hemoglobin A1C 04/17/2017 11.0   Final  . Free T4 04/17/2017 0.78  0.60 - 1.60 ng/dL Final   Comment: Specimens from patients who are undergoing biotin therapy and /or ingesting biotin supplements may contain high levels of biotin.  The higher biotin concentration in these specimens interferes with this Free T4 assay.  Specimens that contain high levels  of biotin may cause false high results for this Free T4 assay.  Please interpret results in light of the total clinical presentation of the patient.    Marland Kitchen TSH 04/17/2017 2.38  0.35 - 4.50 uIU/mL Final   Needs refills LT4.  Philemon Kingdom, MD PhD [facility]

## 2017-04-17 NOTE — Patient Instructions (Addendum)
Please continue: - Lantus 40 units at bedtime  Please restart: - Januvia 100 mg in am, before b'fast  Please increase: - Novolog: 14 units before a smaller meal 18 units before a regular meal   22 units before a larger meal or if you have dessert  If sugars before the meal are: 70 or lower, do not take insulin before that meal 71- 99, take no more than 8 units with that meal 100 or above, take the whole dose of insulin  Do not skip insulin doses.  Please stop at the lab.  Please return in 3 months with your sugar log.

## 2017-04-30 ENCOUNTER — Telehealth: Payer: Self-pay | Admitting: Internal Medicine

## 2017-04-30 NOTE — Telephone Encounter (Signed)
Patient stated she need a refill of synthroid, she is out haven't had any in a week. Please advise

## 2017-05-01 MED ORDER — LEVOTHYROXINE SODIUM 125 MCG PO TABS: 125.0000 ug | ORAL_TABLET | Freq: Every day | ORAL | 3 refills | Status: DC

## 2017-05-01 NOTE — Telephone Encounter (Signed)
Patient advised script called in ,re sent script .

## 2017-05-18 ENCOUNTER — Encounter: Payer: Self-pay | Admitting: Primary Care

## 2017-05-18 ENCOUNTER — Ambulatory Visit (INDEPENDENT_AMBULATORY_CARE_PROVIDER_SITE_OTHER): Payer: Medicare Other | Admitting: Primary Care

## 2017-05-18 VITALS — BP 132/78 | HR 96 | Temp 97.7°F | Ht 66.0 in | Wt 145.4 lb

## 2017-05-18 DIAGNOSIS — G8929 Other chronic pain: Secondary | ICD-10-CM | POA: Diagnosis not present

## 2017-05-18 DIAGNOSIS — I2510 Atherosclerotic heart disease of native coronary artery without angina pectoris: Secondary | ICD-10-CM

## 2017-05-18 DIAGNOSIS — M549 Dorsalgia, unspecified: Secondary | ICD-10-CM

## 2017-05-18 MED ORDER — CYCLOBENZAPRINE HCL 10 MG PO TABS: 10.0000 mg | ORAL_TABLET | Freq: Three times a day (TID) | ORAL | 0 refills | Status: DC | PRN

## 2017-05-18 NOTE — Progress Notes (Signed)
Subjective:    Patient ID: Sarah Payne, female    DOB: 1946-05-27, 71 y.o.   MRN: [id number]  HPI  Sarah Payne is a 71 year old female with a significant cardiac history,  history of depression, chronic back pain, lumbar discectomy, spinal fusion who presents today with a chief complaint of acute on chronic back pain.   Her pain is located to the mid thoracic to lumbar spine that has been present for the past three weeks. She was once managed on Oxycodone for which she's not taken in the last several months. She's been taking Ibuprofen 600 mg in the morning and 600 mg at night without much improvement. She's also taking methocarbamol (old Rx) and celebrex (mother's Rx) with better improvement. She denies numbness/tinlging/pain to lower extremities, bowel and bladder incontinence, groin numbness, recent injury/trauma.   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 She's been told in the past that she needs another surgery but she's been unable to follow back up with the neurosurgeon due to the driving distance.   Review of Systems  Musculoskeletal: Positive for arthralgias, back pain and myalgias.  Skin: Negative for color change.  Neurological: Negative for weakness and numbness.       Past Medical History:  Diagnosis Date  . Cataract    BILATERAL REMOVED  . Chronic low back pain   . COPD (chronic obstructive pulmonary disease) ([place])   . Coronary artery disease    a. multiple PCIs to the mLCx. b. stent to dRCA 08/2011 in setting of NSTEMI. c. DES to prox-mid LCx for ISR 09/2011. d.  DESx2 to prox-mRCA 07/2012. e. Takotsubo event 12/2015 with patent stents.  . Depression   . Diabetes mellitus   . Diverticulosis   . Frequent PVCs    a. Noted in hospital 12/2015.  Marland Kitchen GERD  (gastroesophageal reflux disease)   . Hyperlipidemia   . Hypertension   . Hypothyroidism   . Impingement syndrome of left shoulder   . Marijuana abuse   . Myocardial infarction ([place])    x 5  . Sleep apnea    mild-does not use cpap  . Takotsubo cardiomyopathy    a. 12/2015 - nephew committed suicide 1 week prior, sister died the morning of presentation - initially called a STEMI; cath with patent stents. LVEF 25-30%.  . Tendonitis of left rotator cuff   . Tobacco abuse   . Vascular dementia      Social History   Social History  . Marital status: Married    Spouse name: N/A  . Number of children: N/A  . Years of education: N/A   Occupational History  . Not on file.   Social History Main Topics  . Smoking status: Current Every Day Smoker    Packs/day: 1.00    Years: 45.00    Types: Cigarettes  . Smokeless tobacco: Never Used     Comment: Has cut back, trying to quit.   . Alcohol use No  . Drug use: Yes    Types: Marijuana     Comment: last night 4.13.16  . Sexual activity: Not on file   Other Topics Concern  . Not on file   Social History Narrative   Lives at home with her husband in [place].  Previously used marijuana - quit.  Regular exercise: no/ pain from a frozen rotator cuff   Caffeine use: coffee daily and pepsi      Does not have a living will.   Daughters and husband know her wishes- would desire CPR but not prolonged life support if futile    Past Surgical History:  Procedure Laterality Date  . ABDOMINAL HYSTERECTOMY    . APPENDECTOMY    . BACK SURGERY    . CARDIAC CATHETERIZATION  2013  . CARDIAC CATHETERIZATION  2016  . CARDIAC CATHETERIZATION N/A 01/01/2016   Procedure: Left Heart Cath and Coronary Angiography;  Surgeon: Jettie Booze, MD;  Location: [facility];  Service: Cardiovascular;  Laterality: N/A;  . CATARACT EXTRACTION W/PHACO Left 01/30/2015   Procedure: CATARACT EXTRACTION PHACO AND INTRAOCULAR LENS PLACEMENT  (IOC);  Surgeon: Birder Robson, MD;  Location: [facility];  Service: Ophthalmology;  Laterality: Left;  [place] 00:47   . CATARACT EXTRACTION W/PHACO Right 02/13/2015   Procedure: CATARACT EXTRACTION PHACO AND INTRAOCULAR LENS PLACEMENT (IOC);  Surgeon: Birder Robson, MD;  Location: [facility];  Service: Ophthalmology;  Laterality: Right;  cassette lot # [id number] H [place]  00:29.9 AP  20.7 CDE  6.20  . COLONOSCOPY N/A 11/02/2014   Procedure: COLONOSCOPY;  Surgeon: Inda Castle, MD;  Location: [facility];  Service: Endoscopy;  Laterality: N/A;  . CORONARY ANGIOPLASTY  2012   stent x 3   . CORONARY ANGIOPLASTY WITH STENT PLACEMENT  2013    Family History  Problem Relation Age of Onset  . Heart attack Mother        First MI @ 69 - Died @ 2  . Heart disease Mother   . Heart disease Father        Died @ 71  . Throat cancer Brother   . Liver cancer Brother   . Colon cancer Sister     Allergies  Allergen Reactions  . No Known Allergies     Current Outpatient Prescriptions on File Prior to Visit  Medication Sig Dispense Refill  . ALPRAZolam (XANAX) 0.25 MG tablet Take 1 tablet (0.25 mg total) by mouth 2 (two) times daily as needed for anxiety. 20 tablet 0  . aspirin 81 MG tablet Take 81 mg by mouth daily.      . Calcium & Magnesium Carbonates (MYLANTA PO) Take 15 mLs by mouth as needed (for indegestion).     . carvedilol (COREG) 3.125 MG tablet Take 2 tablets (6.25 mg total) by mouth 2 (two) times daily with a meal. 360 tablet 3  . clopidogrel (PLAVIX) 75 MG tablet TAKE 1 TABLET (75 MG TOTAL) BY MOUTH DAILY. 90 tablet 3  . enalapril (VASOTEC) 10 MG tablet Take 1 tablet (10 mg total) by mouth daily. 90 tablet 3  . ezetimibe (ZETIA) 10 MG tablet Take 1 tablet (10 mg total) by mouth daily. 90 tablet 3  . glucose blood (FREESTYLE LITE) test strip Check blood sugar 5 times daily to check blood sugars - Uncontrolled diabetes mellitus 400 each 5  . glucose monitoring kit (FREESTYLE) monitoring kit  1 each by Does not apply route as needed for other. PHARMACY PLEASE SUBSTITUTE WHATEVER GLUCOMETER YOU CARRY 1 each 1  . insulin aspart (NOVOLOG FLEXPEN) 100 UNIT/ML FlexPen Inject 12-14 Units into the skin 3 (three) times daily with meals. Normally takes 12 units 15 pen 3  . Insulin Glargine (LANTUS SOLOSTAR) 100 UNIT/ML Solostar Pen Inject 40 Units into the skin at bedtime. 15 pen 3  . Insulin Pen  Needle (INSUPEN PEN NEEDLES) 32G X 4 MM MISC Use to inject insulin 4 times daily. 250 each 1  . isosorbide mononitrate (IMDUR) 30 MG 24 hr tablet Take 1 tablet (30 mg total) by mouth daily. 90 tablet 3  . lansoprazole (PREVACID) 15 MG capsule Take 15 mg by mouth daily. Reported on 11/06/2015    . levothyroxine (SYNTHROID, LEVOTHROID) 125 MCG tablet Take 1 tablet (125 mcg total) by mouth daily. 90 tablet 3  . magnesium 30 MG tablet Take 30 mg by mouth daily. Reported on 09/20/2015    . ondansetron (ZOFRAN) 4 MG tablet Take 1 tab every 6 hours as needed for nausea. 30 tablet 2  . pantoprazole (PROTONIX) 40 MG tablet Take 1 tablet (40 mg total) by mouth daily. 90 tablet 3  . rosuvastatin (CRESTOR) 40 MG tablet Take 1 tablet (40 mg total) by mouth daily. 90 tablet 3  . sitaGLIPtin (JANUVIA) 100 MG tablet Take 1 tablet (100 mg total) by mouth daily. 90 tablet 3  . traZODone (DESYREL) 100 MG tablet Take 2 tablets (200 mg total) by mouth at bedtime. 180 tablet 3  . oxyCODONE-acetaminophen (ROXICET) 5-325 MG tablet Take 1 tablet by mouth every 4 (four) hours as needed for severe pain. (Patient not taking: Reported on 05/18/2017) 20 tablet 0   No current facility-administered medications on file prior to visit.     BP 132/78   Pulse 96   Temp 97.7 F (36.5 C) (Oral)   Ht _0  (1.676 m)   Wt 145 lb 6.4 oz (66 kg)   SpO2 97%   BMI 23.47 kg/m    Objective:   Physical Exam  Constitutional: She appears well-nourished.  Neck: Neck supple.  Cardiovascular: Normal rate and regular rhythm.     Pulmonary/Chest: Effort normal and breath sounds normal.  Musculoskeletal:       Back:  Moderate tenderness to bilateral thoracic and lumbar spine  Skin: Skin is warm and dry.          Assessment & Plan:

## 2017-05-18 NOTE — Patient Instructions (Signed)
You may take the cyclobenzaprine 10 mg tablets three times daily as needed for muscle spasms. Caution as these may cause drowsiness.  Continue Ibuprofen 600 mg twice daily. You can alternate with tylenol in between.  You will be contacted regarding your referral to physical therapy.  Please let us know if you have not heard back within one week.   Start stretching as discussed today, take a look at the information below.   It was a pleasure to see you today!   Back Exercises The following exercises strengthen the muscles that help to support the back. They also help to keep the lower back flexible. Doing these exercises can help to prevent back pain or lessen existing pain. If you have back pain or discomfort, try doing these exercises 2-3 times each day or as told by your health care provider. When the pain goes away, do them once each day, but increase the number of times that you repeat the steps for each exercise (do more repetitions). If you do not have back pain or discomfort, do these exercises once each day or as told by your health care provider. Exercises Single Knee to Chest  Repeat these steps 3-5 times for each leg: 1. Lie on your back on a firm bed or the floor with your legs extended. 2. Bring one knee to your chest. Your other leg should stay extended and in contact with the floor. 3. Hold your knee in place by grabbing your knee or thigh. 4. Pull on your knee until you feel a gentle stretch in your lower back. 5. Hold the stretch for 10-30 seconds. 6. Slowly release and straighten your leg.  Pelvic Tilt  Repeat these steps 5-10 times: 1. Lie on your back on a firm bed or the floor with your legs extended. 2. Bend your knees so they are pointing toward the ceiling and your feet are flat on the floor. 3. Tighten your lower abdominal muscles to press your lower back against the floor. This motion will tilt your pelvis so your tailbone points up toward the ceiling instead  of pointing to your feet or the floor. 4. With gentle tension and even breathing, hold this position for 5-10 seconds.  Cat-Cow  Repeat these steps until your lower back becomes more flexible: 1. Get into a hands-and-knees position on a firm surface. Keep your hands under your shoulders, and keep your knees under your hips. You may place padding under your knees for comfort. 2. Let your head hang down, and point your tailbone toward the floor so your lower back becomes rounded like the back of a cat. 3. Hold this position for 5 seconds. 4. Slowly lift your head and point your tailbone up toward the ceiling so your back forms a sagging arch like the back of a cow. 5. Hold this position for 5 seconds.  Press-Ups  Repeat these steps 5-10 times: 1. Lie on your abdomen (face-down) on the floor. 2. Place your palms near your head, about shoulder-width apart. 3. While you keep your back as relaxed as possible and keep your hips on the floor, slowly straighten your arms to raise the top half of your body and lift your shoulders. Do not use your back muscles to raise your upper torso. You may adjust the placement of your hands to make yourself more comfortable. 4. Hold this position for 5 seconds while you keep your back relaxed. 5. Slowly return to lying flat on the floor.  Constance Haw  Repeat these steps 10 times: 1. Lie on your back on a firm surface. 2. Bend your knees so they are pointing toward the ceiling and your feet are flat on the floor. 3. Tighten your buttocks muscles and lift your buttocks off of the floor until your waist is at almost the same height as your knees. You should feel the muscles working in your buttocks and the back of your thighs. If you do not feel these muscles, slide your feet 1-2 inches farther away from your buttocks. 4. Hold this position for 3-5 seconds. 5. Slowly lower your hips to the starting position, and allow your buttocks muscles to relax completely.  If  this exercise is too easy, try doing it with your arms crossed over your chest. Abdominal Crunches  Repeat these steps 5-10 times: 1. Lie on your back on a firm bed or the floor with your legs extended. 2. Bend your knees so they are pointing toward the ceiling and your feet are flat on the floor. 3. Cross your arms over your chest. 4. Tip your chin slightly toward your chest without bending your neck. 5. Tighten your abdominal muscles and slowly raise your trunk (torso) high enough to lift your shoulder blades a tiny bit off of the floor. Avoid raising your torso higher than that, because it can put too much stress on your low back and it does not help to strengthen your abdominal muscles. 6. Slowly return to your starting position.  Back Lifts Repeat these steps 5-10 times: 1. Lie on your abdomen (face-down) with your arms at your sides, and rest your forehead on the floor. 2. Tighten the muscles in your legs and your buttocks. 3. Slowly lift your chest off of the floor while you keep your hips pressed to the floor. Keep the back of your head in line with the curve in your back. Your eyes should be looking at the floor. 4. Hold this position for 3-5 seconds. 5. Slowly return to your starting position.  Contact a health care provider if:  Your back pain or discomfort gets much worse when you do an exercise.  Your back pain or discomfort does not lessen within 2 hours after you exercise. If you have any of these problems, stop doing these exercises right away. Do not do them again unless your health care provider says that you can. Get help right away if:  You develop sudden, severe back pain. If this happens, stop doing the exercises right away. Do not do them again unless your health care provider says that you can. This information is not intended to replace advice given to you by your health care provider. Make sure you discuss any questions you have with your health care  provider. Document Released: 08/14/2004 Document Revised: 11/14/2015 Document Reviewed: 08/31/2014 Elsevier Interactive Patient Education  2017 [employer].

## 2017-05-19 DIAGNOSIS — G8929 Other chronic pain: Secondary | ICD-10-CM | POA: Insufficient documentation

## 2017-05-19 DIAGNOSIS — M549 Dorsalgia, unspecified: Secondary | ICD-10-CM

## 2017-05-19 NOTE — Assessment & Plan Note (Addendum)
Acute on chronic issue today. Seems very much MSK given tenderness and presentation. Rx for Flexeril sent to pharmacy. Also discussed use of heating pads and stretching. Referral placed to physical therapy for further evaluation. Consider plain films vs neurosurgery consultation if no improvement.

## 2017-06-02 ENCOUNTER — Ambulatory Visit: Payer: Medicare Other

## 2017-06-02 ENCOUNTER — Ambulatory Visit: Payer: Medicare Other | Attending: Primary Care

## 2017-06-08 ENCOUNTER — Ambulatory Visit: Payer: Medicare Other

## 2017-06-23 ENCOUNTER — Ambulatory Visit (INDEPENDENT_AMBULATORY_CARE_PROVIDER_SITE_OTHER): Payer: Medicare Other | Admitting: Primary Care

## 2017-06-23 DIAGNOSIS — I2510 Atherosclerotic heart disease of native coronary artery without angina pectoris: Secondary | ICD-10-CM | POA: Diagnosis not present

## 2017-06-23 DIAGNOSIS — I5181 Takotsubo syndrome: Secondary | ICD-10-CM | POA: Diagnosis not present

## 2017-06-23 DIAGNOSIS — I5022 Chronic systolic (congestive) heart failure: Secondary | ICD-10-CM

## 2017-06-23 DIAGNOSIS — E1151 Type 2 diabetes mellitus with diabetic peripheral angiopathy without gangrene: Secondary | ICD-10-CM | POA: Diagnosis not present

## 2017-06-23 DIAGNOSIS — E039 Hypothyroidism, unspecified: Secondary | ICD-10-CM

## 2017-06-23 DIAGNOSIS — K219 Gastro-esophageal reflux disease without esophagitis: Secondary | ICD-10-CM

## 2017-06-23 DIAGNOSIS — M549 Dorsalgia, unspecified: Secondary | ICD-10-CM | POA: Diagnosis not present

## 2017-06-23 DIAGNOSIS — I10 Essential (primary) hypertension: Secondary | ICD-10-CM

## 2017-06-23 DIAGNOSIS — G4700 Insomnia, unspecified: Secondary | ICD-10-CM

## 2017-06-23 DIAGNOSIS — E782 Mixed hyperlipidemia: Secondary | ICD-10-CM | POA: Diagnosis not present

## 2017-06-23 DIAGNOSIS — G8929 Other chronic pain: Secondary | ICD-10-CM | POA: Diagnosis not present

## 2017-06-23 MED ORDER — CYCLOBENZAPRINE HCL 10 MG PO TABS: 5.0000 mg | ORAL_TABLET | Freq: Three times a day (TID) | ORAL | 0 refills | Status: DC | PRN

## 2017-06-23 NOTE — Assessment & Plan Note (Addendum)
Very problematic and debilitating for patient, although Improved since initiation of cyclobenzaprine, couldn't attend PT given pain. Referral placed to pain management for further evaluation.

## 2017-06-23 NOTE — Progress Notes (Signed)
Subjective:    Patient ID: Sarah Payne, female    DOB: [birth year], 71 y.o.   MRN: [id number]  HPI  [name] is a 71 year old female who presents today to transfer care from Dr. Deborra Medina.  1) CAD/CHF/Takosdubo cardiomyopathy/Hypertension/Hyperlipidemia: Currently managed on aspirin 81 mg, carvedilol, Imdur, clopidogrel, enalapril, Zetia, rosuvastatin. Currently following with cardiology Rockey Situ) with last visit in March 2018. Follows every six months.   2) Type 2 Diabetes: Currently managed on Lantus 40 units at bedtime and Novolog 12-14 units three times daily, and Januvia 100 mg. Currently following with endocrinology. Last visit in September 2018 with A1C of 11.0. Due for next visit in December 2018.   3) Hypothyroidism: Currently managed on levothyroxine 125 mcg. TSH in September 2018 unremarkable.  4) GERD: Currently managed on prevacid 15 mg and pantoprazole 40 mg. She's not taken either medications in months. She doesn't take them together.   5) Insomnia: Currently managed on Trazodone 200 mg, she will fall asleep without difficulty, but will wake at 3am-4 am most every night. She often forgets to take her Trazodone, no recent use.   6) Chronic Back Pain: Present for years. History of back surgery in 2009. Currently taking cyclobenzaprine tablets that were prescribed in late October. She did not attend physical therapy due to pain. She was once seeing pain management years ago, underwent injections without much improvement so It was then decided to move forward with surgery at that time (2009).   Review of Systems  Eyes: Negative for visual disturbance.  Respiratory: Negative for shortness of breath.   Cardiovascular: Negative for chest pain.  Gastrointestinal:       Denies esophageal reflux  Musculoskeletal: Positive for back pain.  Psychiatric/Behavioral: Positive for sleep disturbance.       Past Medical History:  Diagnosis Date  . Cataract    BILATERAL REMOVED  . Chronic  low back pain   . COPD (chronic obstructive pulmonary disease) ([place])   . Coronary artery disease    a. multiple PCIs to the mLCx. b. stent to dRCA 08/2011 in setting of NSTEMI. c. DES to prox-mid LCx for ISR 09/2011. d.  DESx2 to prox-mRCA 07/2012. e. Takotsubo event 12/2015 with patent stents.  . Depression   . Diabetes mellitus   . Diverticulosis   . Frequent PVCs    a. Noted in hospital 12/2015.  Marland Kitchen GERD (gastroesophageal reflux disease)   . Hyperlipidemia   . Hypertension   . Hypothyroidism   . Impingement syndrome of left shoulder   . Marijuana abuse   . Myocardial infarction ([place])    x 5  . Sleep apnea    mild-does not use cpap  . Takotsubo cardiomyopathy    a. 12/2015 - nephew committed suicide 1 week prior, sister died the morning of presentation - initially called a STEMI; cath with patent stents. LVEF 25-30%.  . Tendonitis of left rotator cuff   . Tobacco abuse   . Vascular dementia      Social History   Socioeconomic History  . Marital status: Married    Spouse name: Not on file  . Number of children: Not on file  . Years of education: Not on file  . Highest education level: Not on file  Social Needs  . Financial resource strain: Not on file  . Food insecurity - worry: Not on file  . Food insecurity - inability: Not on file  . Transportation needs - medical: Not on file  . Transportation  needs - non-medical: Not on file  Occupational History  . Not on file  Tobacco Use  . Smoking status: Current Every Day Smoker    Packs/day: 1.00    Years: 45.00    Pack years: 45.00    Types: Cigarettes  . Smokeless tobacco: Never Used  . Tobacco comment: Has cut back, trying to quit.   Substance and Sexual Activity  . Alcohol use: No    Alcohol/week: 0.0 oz  . Drug use: Yes    Types: Marijuana    Comment: last night 4.13.16  . Sexual activity: Not on file  Other Topics Concern  . Not on file  Social History Narrative   Lives at home with her husband in [place].   Previously used marijuana - quit.      Regular exercise: no/ pain from a frozen rotator cuff   Caffeine use: coffee daily and pepsi      Does not have a living will.   Daughters and husband know her wishes- would desire CPR but not prolonged life support if futile    Past Surgical History:  Procedure Laterality Date  . ABDOMINAL HYSTERECTOMY    . APPENDECTOMY    . BACK SURGERY    . CARDIAC CATHETERIZATION  2013  . CARDIAC CATHETERIZATION  2016  . CARDIAC CATHETERIZATION N/A 01/01/2016   Procedure: Left Heart Cath and Coronary Angiography;  Surgeon: Jettie Booze, MD;  Location: [facility];  Service: Cardiovascular;  Laterality: N/A;  . CATARACT EXTRACTION W/PHACO Left 01/30/2015   Procedure: CATARACT EXTRACTION PHACO AND INTRAOCULAR LENS PLACEMENT (IOC);  Surgeon: Birder Robson, MD;  Location: [facility];  Service: Ophthalmology;  Laterality: Left;  [place] 00:47   . CATARACT EXTRACTION W/PHACO Right 02/13/2015   Procedure: CATARACT EXTRACTION PHACO AND INTRAOCULAR LENS PLACEMENT (IOC);  Surgeon: Birder Robson, MD;  Location: [facility];  Service: Ophthalmology;  Laterality: Right;  cassette lot # [id number] H [place]  00:29.9 AP  20.7 CDE  6.20  . COLONOSCOPY N/A 11/02/2014   Procedure: COLONOSCOPY;  Surgeon: Inda Castle, MD;  Location: [facility];  Service: Endoscopy;  Laterality: N/A;  . CORONARY ANGIOPLASTY  2012   stent x 3   . CORONARY ANGIOPLASTY WITH STENT PLACEMENT  2013    Family History  Problem Relation Age of Onset  . Heart attack Mother        First MI @ 66 - Died @ 64  . Heart disease Mother   . Heart disease Father        Died @ 65  . Throat cancer Brother   . Liver cancer Brother   . Colon cancer Sister     Allergies  Allergen Reactions  . No Known Allergies     Current Outpatient Medications on File Prior to Visit  Medication Sig Dispense Refill  . aspirin 81 MG tablet Take 81 mg by mouth daily.      . Calcium & Magnesium Carbonates (MYLANTA PO)  Take 15 mLs by mouth as needed (for indegestion).     . carvedilol (COREG) 3.125 MG tablet Take 2 tablets (6.25 mg total) by mouth 2 (two) times daily with a meal. 360 tablet 3  . clopidogrel (PLAVIX) 75 MG tablet TAKE 1 TABLET (75 MG TOTAL) BY MOUTH DAILY. 90 tablet 3  . enalapril (VASOTEC) 10 MG tablet Take 1 tablet (10 mg total) by mouth daily. 90 tablet 3  . ezetimibe (ZETIA) 10 MG tablet Take 1 tablet (10 mg total) by mouth  daily. 90 tablet 3  . glucose blood (FREESTYLE LITE) test strip Check blood sugar 5 times daily to check blood sugars - Uncontrolled diabetes mellitus 400 each 5  . glucose monitoring kit (FREESTYLE) monitoring kit 1 each by Does not apply route as needed for other. PHARMACY PLEASE SUBSTITUTE WHATEVER GLUCOMETER YOU CARRY 1 each 1  . insulin aspart (NOVOLOG FLEXPEN) 100 UNIT/ML FlexPen Inject 12-14 Units into the skin 3 (three) times daily with meals. Normally takes 12 units 15 pen 3  . Insulin Glargine (LANTUS SOLOSTAR) 100 UNIT/ML Solostar Pen Inject 40 Units into the skin at bedtime. 15 pen 3  . Insulin Pen Needle (INSUPEN PEN NEEDLES) 32G X 4 MM MISC Use to inject insulin 4 times daily. 250 each 1  . isosorbide mononitrate (IMDUR) 30 MG 24 hr tablet Take 1 tablet (30 mg total) by mouth daily. 90 tablet 3  . lansoprazole (PREVACID) 15 MG capsule Take 15 mg by mouth daily. Reported on 11/06/2015    . levothyroxine (SYNTHROID, LEVOTHROID) 125 MCG tablet Take 1 tablet (125 mcg total) by mouth daily. 90 tablet 3  . pantoprazole (PROTONIX) 40 MG tablet Take 1 tablet (40 mg total) by mouth daily. 90 tablet 3  . rosuvastatin (CRESTOR) 40 MG tablet Take 1 tablet (40 mg total) by mouth daily. 90 tablet 3  . sitaGLIPtin (JANUVIA) 100 MG tablet Take 1 tablet (100 mg total) by mouth daily. 90 tablet 3  . traZODone (DESYREL) 100 MG tablet Take 2 tablets (200 mg total) by mouth at bedtime. 180 tablet 3   No current facility-administered medications on file prior to visit.     BP  136/82   Pulse 72   Temp 98.2 F (36.8 C) (Oral)   Ht 5' 6"  (1.676 m)   Wt 146 lb 6.4 oz (66.4 kg)   SpO2 97%   BMI 23.63 kg/m    Objective:   Physical Exam  Constitutional: She appears well-nourished.  Neck: Neck supple.  Cardiovascular: Normal rate and regular rhythm.  Pulmonary/Chest: Effort normal and breath sounds normal.  Musculoskeletal:       Back:  Ambulates well in the clinic. Mild decrease in ROM with forward flexion.   Skin: Skin is warm and dry.          Assessment & Plan:

## 2017-06-23 NOTE — Assessment & Plan Note (Signed)
TSH stable, continue levothyroxine 125 mcg.

## 2017-06-23 NOTE — Assessment & Plan Note (Signed)
Encouraged to stop smoking. Continue plavix, aspirin, Crestor.

## 2017-06-23 NOTE — Assessment & Plan Note (Signed)
Uncontrolled. Following with endocrinology. Compliant to insulin and Januvia.

## 2017-06-23 NOTE — Assessment & Plan Note (Signed)
Following with cardiology. 

## 2017-06-23 NOTE — Assessment & Plan Note (Signed)
Discussed to use Trazodone as prescribed for insomnia.

## 2017-06-23 NOTE — Assessment & Plan Note (Signed)
Following with cardiology. Stable in the office today. Continue current regimen.

## 2017-06-23 NOTE — Patient Instructions (Signed)
Continue cyclobenzaprine 10 mg tablets. Take 1/2 to 1 tablet by mouth every 8 hours as needed for muscle spasms.   You will be contacted regarding your referral to pain management.  Please let us know if you have not heard back within one week.   Please schedule a medicare wellness visit with our nurse and physical with me in 6 months.  It was a pleasure to see you today!   Back Exercises The following exercises strengthen the muscles that help to support the back. They also help to keep the lower back flexible. Doing these exercises can help to prevent back pain or lessen existing pain. If you have back pain or discomfort, try doing these exercises 2-3 times each day or as told by your health care provider. When the pain goes away, do them once each day, but increase the number of times that you repeat the steps for each exercise (do more repetitions). If you do not have back pain or discomfort, do these exercises once each day or as told by your health care provider. Exercises Single Knee to Chest  Repeat these steps 3-5 times for each leg: 1. Lie on your back on a firm bed or the floor with your legs extended. 2. Bring one knee to your chest. Your other leg should stay extended and in contact with the floor. 3. Hold your knee in place by grabbing your knee or thigh. 4. Pull on your knee until you feel a gentle stretch in your lower back. 5. Hold the stretch for 10-30 seconds. 6. Slowly release and straighten your leg.  Pelvic Tilt  Repeat these steps 5-10 times: 1. Lie on your back on a firm bed or the floor with your legs extended. 2. Bend your knees so they are pointing toward the ceiling and your feet are flat on the floor. 3. Tighten your lower abdominal muscles to press your lower back against the floor. This motion will tilt your pelvis so your tailbone points up toward the ceiling instead of pointing to your feet or the floor. 4. With gentle tension and even breathing, hold  this position for 5-10 seconds.  Cat-Cow  Repeat these steps until your lower back becomes more flexible: 1. Get into a hands-and-knees position on a firm surface. Keep your hands under your shoulders, and keep your knees under your hips. You may place padding under your knees for comfort. 2. Let your head hang down, and point your tailbone toward the floor so your lower back becomes rounded like the back of a cat. 3. Hold this position for 5 seconds. 4. Slowly lift your head and point your tailbone up toward the ceiling so your back forms a sagging arch like the back of a cow. 5. Hold this position for 5 seconds.  Press-Ups  Repeat these steps 5-10 times: 1. Lie on your abdomen (face-down) on the floor. 2. Place your palms near your head, about shoulder-width apart. 3. While you keep your back as relaxed as possible and keep your hips on the floor, slowly straighten your arms to raise the top half of your body and lift your shoulders. Do not use your back muscles to raise your upper torso. You may adjust the placement of your hands to make yourself more comfortable. 4. Hold this position for 5 seconds while you keep your back relaxed. 5. Slowly return to lying flat on the floor.  Bridges  Repeat these steps 10 times: 1. Lie on your back on a  firm surface. 2. Bend your knees so they are pointing toward the ceiling and your feet are flat on the floor. 3. Tighten your buttocks muscles and lift your buttocks off of the floor until your waist is at almost the same height as your knees. You should feel the muscles working in your buttocks and the back of your thighs. If you do not feel these muscles, slide your feet 1-2 inches farther away from your buttocks. 4. Hold this position for 3-5 seconds. 5. Slowly lower your hips to the starting position, and allow your buttocks muscles to relax completely.  If this exercise is too easy, try doing it with your arms crossed over your  chest. Abdominal Crunches  Repeat these steps 5-10 times: 1. Lie on your back on a firm bed or the floor with your legs extended. 2. Bend your knees so they are pointing toward the ceiling and your feet are flat on the floor. 3. Cross your arms over your chest. 4. Tip your chin slightly toward your chest without bending your neck. 5. Tighten your abdominal muscles and slowly raise your trunk (torso) high enough to lift your shoulder blades a tiny bit off of the floor. Avoid raising your torso higher than that, because it can put too much stress on your low back and it does not help to strengthen your abdominal muscles. 6. Slowly return to your starting position.  Back Lifts Repeat these steps 5-10 times: 1. Lie on your abdomen (face-down) with your arms at your sides, and rest your forehead on the floor. 2. Tighten the muscles in your legs and your buttocks. 3. Slowly lift your chest off of the floor while you keep your hips pressed to the floor. Keep the back of your head in line with the curve in your back. Your eyes should be looking at the floor. 4. Hold this position for 3-5 seconds. 5. Slowly return to your starting position.  Contact a health care provider if:  Your back pain or discomfort gets much worse when you do an exercise.  Your back pain or discomfort does not lessen within 2 hours after you exercise. If you have any of these problems, stop doing these exercises right away. Do not do them again unless your health care provider says that you can. Get help right away if:  You develop sudden, severe back pain. If this happens, stop doing the exercises right away. Do not do them again unless your health care provider says that you can. This information is not intended to replace advice given to you by your health care provider. Make sure you discuss any questions you have with your health care provider. Document Released: 08/14/2004 Document Revised: 11/14/2015 Document  Reviewed: 08/31/2014 Elsevier Interactive Patient Education  2017 [employer].

## 2017-06-23 NOTE — Assessment & Plan Note (Signed)
Overdue for lipid panel, not fasting today. Will have her return for lipids when fasting.  Continue Crestor and Zetia as prescribed,

## 2017-06-23 NOTE — Assessment & Plan Note (Signed)
Discussed to avoid combination of Prevacid and pantoprazole. No recent use of either.

## 2017-07-17 ENCOUNTER — Other Ambulatory Visit: Payer: Self-pay

## 2017-07-17 ENCOUNTER — Telehealth: Payer: Self-pay | Admitting: Cardiovascular Disease

## 2017-07-17 ENCOUNTER — Ambulatory Visit (INDEPENDENT_AMBULATORY_CARE_PROVIDER_SITE_OTHER): Payer: Medicare Other | Admitting: Internal Medicine

## 2017-07-17 ENCOUNTER — Encounter: Payer: Self-pay | Admitting: Internal Medicine

## 2017-07-17 VITALS — BP 150/102 | HR 81 | Ht 66.0 in | Wt 147.4 lb

## 2017-07-17 DIAGNOSIS — I2510 Atherosclerotic heart disease of native coronary artery without angina pectoris: Secondary | ICD-10-CM | POA: Diagnosis not present

## 2017-07-17 DIAGNOSIS — E1151 Type 2 diabetes mellitus with diabetic peripheral angiopathy without gangrene: Secondary | ICD-10-CM | POA: Diagnosis not present

## 2017-07-17 DIAGNOSIS — E039 Hypothyroidism, unspecified: Secondary | ICD-10-CM | POA: Diagnosis not present

## 2017-07-17 LAB — POCT GLYCOSYLATED HEMOGLOBIN (HGB A1C): Hemoglobin A1C: 10.8

## 2017-07-17 MED ORDER — INSULIN GLARGINE 100 UNIT/ML SOLOSTAR PEN: 40.0000 [IU] | PEN_INJECTOR | Freq: Every day | SUBCUTANEOUS | 3 refills | Status: DC

## 2017-07-17 MED ORDER — EZETIMIBE 10 MG PO TABS: 10.0000 mg | ORAL_TABLET | Freq: Every day | ORAL | 3 refills | Status: DC

## 2017-07-17 MED ORDER — ENALAPRIL MALEATE 10 MG PO TABS: 10.0000 mg | ORAL_TABLET | Freq: Every day | ORAL | 3 refills | Status: DC

## 2017-07-17 MED ORDER — CLOPIDOGREL BISULFATE 75 MG PO TABS: ORAL_TABLET | ORAL | 3 refills | Status: DC

## 2017-07-17 MED ORDER — CARVEDILOL 3.125 MG PO TABS: 6.2500 mg | ORAL_TABLET | Freq: Two times a day (BID) | ORAL | 3 refills | Status: DC

## 2017-07-17 MED ORDER — INSULIN ASPART 100 UNIT/ML FLEXPEN: 14.0000 [IU] | PEN_INJECTOR | Freq: Three times a day (TID) | SUBCUTANEOUS | 3 refills | Status: DC

## 2017-07-17 MED ORDER — PANTOPRAZOLE SODIUM 40 MG PO TBEC: 40.0000 mg | DELAYED_RELEASE_TABLET | Freq: Every day | ORAL | 3 refills | Status: DC

## 2017-07-17 MED ORDER — ISOSORBIDE MONONITRATE ER 30 MG PO TB24: 30.0000 mg | ORAL_TABLET | Freq: Every day | ORAL | 3 refills | Status: DC

## 2017-07-17 MED ORDER — ROSUVASTATIN CALCIUM 40 MG PO TABS: 40.0000 mg | ORAL_TABLET | Freq: Every day | ORAL | 3 refills | Status: DC

## 2017-07-17 NOTE — Progress Notes (Signed)
Patient ID: Sarah Payne, female   DOB: [birth year], 71 y.o.   MRN: [id number]  HPI: Sarah Payne is a 71 y.o.-year-old female, returning for f/u for DM2, dx 2003, insulin-dependent since 2013, uncontrolled, with complications (CAD, sCHF, PAD, CKD, PN) and also complicated by dementia and medication noncompliance, also hypothyroidism, uncontrolled. Last visit 3 mo ago.  Her brother also died since last visit.  She tells me that out of 13 children, there are only for left.  DM2: Last hemoglobin A1c: Lab Results  Component Value Date   HGBA1C 11.0 04/17/2017   HGBA1C 10.4 (H) 09/16/2016   HGBA1C 11.7 (H) 06/02/2016   Pt is on a regimen of: - Lantus 40 units at bedtime - Januvia 100 mg in am, before b'fast - Novolog: 14 units before a smaller meal. Only uses this and takes it after meals.     If sugars before the meal are: 70 or lower, do not take insulin before that meal 71- 99, take no more than 8 units with that meal 100 or above, take the whole dose of insulin  Pt checks her sugars 1-2 times a day-  she only checked -for a period of approximately 1 week  Before this appointment: - am:110-338, 376 >> 80 x1, 100 x1, 170-267 >> 175, 220-257, 300 >> 174-341 - 2h after b'fast: 340 >> n/c >> 245-400 >> n/c >> 98 - before lunch:   219 >> 57 (correction of 400), 137-256 >> 124, 266, 368 >> 422 - 2h after lunch:   57, 100-319 >> 69 x1, 70 x1, 93-298 >> n/c >> 117-361 - before dinner: 68, 169-278 >> 74-299 >> 56, 74-222 >> 179-230 >> 210, 269 - 2h after dinner: n/c >> 77-277 >> 94-240 >> 55-285 >> n/c >> 189 - bedtime: 273-352 >> 62, 126-296 >> 69, 150-242 >> n/c >> 263 - nighttime: 54, 58, 145 >> 270 >> n/c Lowest sugar was 57 x1 >> 55 >> 79 >> 98 Highest sugar was 376 >> 400 >> 398 >> 422  -+ CKD, last BUN/creatinine:  Lab Results  Component Value Date   BUN 15 06/02/2016   CREATININE 1.04 06/02/2016  On enalapril. -+ HL; last set of lipids: Lab Results  Component Value Date   CHOL 301 (H) 06/02/2016   HDL 45.10 06/02/2016   LDLCALC 146 (H) 03/12/2015   LDLDIRECT 207.0 06/02/2016   TRIG 387.0 (H) 06/02/2016   CHOLHDL 7 06/02/2016  She is on Crestor and Zetia. She is on ASA 81 - last eye exam was in 2016: No DR.  She had cataract sx. -+ Numbness and tingling in her feet.  Hypothyroidism: -Previous history of medication noncompliance  Pt is on levothyroxine 125 mcg daily, taken: - in am - fasting - at least 30 min from b'fast - no Ca, Fe, MVI, PPIs - not on Biotin  Latest TSH was normal: Lab Results  Component Value Date   TSH 2.38 04/17/2017   TSH 0.07 (L) 09/18/2016   TSH 0.36 04/17/2016   TSH 0.24 (L) 02/06/2016   TSH 51.35 (H) 09/20/2015   TSH 0.52 06/18/2015   TSH 53.23 (H) 06/01/2015   TSH 40.79 (H) 03/12/2015   TSH 53.83 (H) 12/21/2014   TSH 60.34 (H) 08/16/2014   FREET4 0.78 04/17/2017   FREET4 0.97 09/18/2016   FREET4 1.21 04/17/2016   FREET4 2.16 (H) 02/06/2016   FREET4 0.27 (L) 09/20/2015   FREET4 0.57 (L) 03/12/2015   FREET4 0.21 (L) 12/21/2014   FREET4  0.30 (L) 08/16/2014   FREET4 1.03 04/26/2014   FREET4 1.58 11/22/2013   Pt denies: - feeling nodules in neck - hoarseness - dysphagia - choking - SOB with lying down  Her sister died, her nephew killed himself and she had an AMI in summer 2017.  ROS: Constitutional: + weight gain/no weight loss, + fatigue, no subjective hyperthermia, no subjective hypothermia Eyes: no blurry vision, no xerophthalmia ENT: no sore throat, + see HPI Cardiovascular: no CP/no SOB/no palpitations/no leg swelling Respiratory: no cough/no SOB/no wheezing Gastrointestinal: no N/no V/no D/no C/no acid reflux Musculoskeletal: + muscle aches/no joint aches Skin: no rashes, no hair loss Neurological: no tremors/+ numbness/+ tingling/no dizziness  I reviewed pt's medications, allergies, PMH, social hx, family hx, and changes were documented in the history of present illness. Otherwise, unchanged  from my initial visit note.  PE: BP (!) 150/102   Pulse 81   Ht 5\' 6"  (1.676 m)   Wt 147 lb 6.4 oz (66.9 kg)   SpO2 98%   BMI 23.79 kg/m  Body mass index is 23.79 kg/m. Wt Readings from Last 3 Encounters:  07/17/17 147 lb 6.4 oz (66.9 kg)  06/23/17 146 lb 6.4 oz (66.4 kg)  05/18/17 145 lb 6.4 oz (66 kg)   Constitutional: Normal weight, in NAD Eyes: PERRLA, EOMI, no exophthalmos ENT: moist mucous membranes, no thyromegaly, no cervical lymphadenopathy Cardiovascular: RRR, No MRG Respiratory: CTA B Gastrointestinal: abdomen soft, NT, ND, BS+ Musculoskeletal: no deformities, strength intact in all 4 Skin: moist, warm, no rashes Neurological: no tremor with outstretched hands, DTR normal in all 4  ASSESSMENT: 1. DM2, insulin-dependent, uncontrolled, with complications - med noncompliance (dementia) - CAD - had 3 AMI in 2011 >> 7 stents placed; new AMI 2016/03/15 after her sister's death - sCHF - PAD - CKD - PN  2. Hypothyroidism - Uncontrolled  PLAN:  1. Patient with long-standing, uncontrolled, diabetes, with history of medication and visit noncompliance, returning with only few sugars checked since last visit.  Also, she did not follow instructions about increasing her NovoLog and taking it before meals.  She takes the lowest dose possible and takes it after meals.  I advised her that she does need more insulin with her meals and gave her new NovoLog dosing instructions.  We also discussed that it would be best to take it 10-50 minutes before she actually plans to eat. - No other changes are necessary for now. - I suggested to:  Patient Instructions  Please continue: - Lantus 40 units at bedtime - Januvia 100 mg in am, before b'fast  Increase: - Novolog: (14 units before a smaller meal) 18 units before a regular meal   22 units before a larger meal or if you have dessert  If sugars before the meal are: 70 or lower, do not take insulin before that meal 71- 99, take no  more than 8 units with that meal 100 or above, take the whole dose of insulin  Do not skip insulin doses.  Please return in 3 months with your sugar log.       - today, HbA1c is 10.8% (not significantly improved) - continue checking sugars at different times of the day - check 3x a day, rotating checks - advised for yearly eye exams >> she is not up-to-date. - Return to clinic in 3 mo with sugar log   2. Hypothyroidism - Patient's thyroid tests have been very uncontrolled, and this could have been from poor medication compliance +/-  taking her levothyroxine along with calcium and PPIs.  Now off these medications and not missing doses. - latest thyroid labs reviewed with pt >> normal 3 months ago - she continues on LT4 125 Mcg daily - pt feels good on this dose. - we discussed about taking the thyroid hormone every day, with water, >30 minutes before breakfast, separated by >4 hours from acid reflux medications, calcium, iron, multivitamins. Pt. is taking it correctly   Philemon Kingdom, MD PhD [facility]

## 2017-07-17 NOTE — Telephone Encounter (Signed)
°*  STAT* If patient is at the pharmacy, call can be transferred to refill team.   1. Which medications need to be refilled? (please list name of each medication and dose if known)  Carvedilol (COREG) 3.125 Plavix 75 MG Enalapril 10 MG Zetia 10 MG IMDUR 30 MG PROTONIX 40 MG CRESTOR 40 MG   2. Which pharmacy/location (including street and city if local pharmacy) is medication to be sent to? [place]  3. Do they need a 30 day or 90 day supply? 90 day

## 2017-07-17 NOTE — Addendum Note (Signed)
Addended by: Drucilla Schmidt on: 07/17/2017 11:31 AM   Modules accepted: Orders

## 2017-07-17 NOTE — Telephone Encounter (Signed)
Requested Prescriptions   Signed Prescriptions Disp Refills  . carvedilol (COREG) 3.125 MG tablet 360 tablet 3    Sig: Take 2 tablets (6.25 mg total) by mouth 2 (two) times daily with a meal.    Authorizing Provider: Minna Merritts    Ordering User: Janan Ridge  . clopidogrel (PLAVIX) 75 MG tablet 90 tablet 3    Sig: TAKE 1 TABLET (75 MG TOTAL) BY MOUTH DAILY.    Authorizing Provider: Minna Merritts    Ordering User: Janan Ridge enalapril (VASOTEC) 10 MG tablet 90 tablet 3    Sig: Take 1 tablet (10 mg total) by mouth daily.    Authorizing Provider: Minna Merritts    Ordering User: Janan Ridge ezetimibe (ZETIA) 10 MG tablet 90 tablet 3    Sig: Take 1 tablet (10 mg total) by mouth daily.    Authorizing Provider: Minna Merritts    Ordering User: Janan Ridge pantoprazole (PROTONIX) 40 MG tablet 90 tablet 3    Sig: Take 1 tablet (40 mg total) by mouth daily.    Authorizing Provider: Minna Merritts    Ordering User: Janan Ridge isosorbide mononitrate (IMDUR) 30 MG 24 hr tablet 90 tablet 3    Sig: Take 1 tablet (30 mg total) by mouth daily.    Authorizing Provider: Minna Merritts    Ordering User: Janan Ridge rosuvastatin (CRESTOR) 40 MG tablet 90 tablet 3    Sig: Take 1 tablet (40 mg total) by mouth daily.    Authorizing Provider: Minna Merritts    Ordering User: Janan Ridge

## 2017-07-17 NOTE — Patient Instructions (Addendum)
Please continue: - Lantus 40 units at bedtime - Januvia 100 mg in am, before b'fast  Increase: - Novolog: (14 units before a smaller meal) 18 units before a regular meal   22 units before a larger meal or if you have dessert  If sugars before the meal are: 70 or lower, do not take insulin before that meal 71- 99, take no more than 8 units with that meal 100 or above, take the whole dose of insulin  Do not skip insulin doses.  Please return in 3 months with your sugar log.

## 2017-07-23 ENCOUNTER — Ambulatory Visit (INDEPENDENT_AMBULATORY_CARE_PROVIDER_SITE_OTHER): Payer: Medicare Other | Admitting: Cardiovascular Disease

## 2017-07-23 ENCOUNTER — Encounter: Payer: Self-pay | Admitting: Cardiovascular Disease

## 2017-07-23 VITALS — BP 154/82 | HR 64 | Ht 66.5 in | Wt 147.5 lb

## 2017-07-23 DIAGNOSIS — J449 Chronic obstructive pulmonary disease, unspecified: Secondary | ICD-10-CM | POA: Diagnosis not present

## 2017-07-23 DIAGNOSIS — I5022 Chronic systolic (congestive) heart failure: Secondary | ICD-10-CM

## 2017-07-23 DIAGNOSIS — Z720 Tobacco use: Secondary | ICD-10-CM

## 2017-07-23 DIAGNOSIS — I2510 Atherosclerotic heart disease of native coronary artery without angina pectoris: Secondary | ICD-10-CM

## 2017-07-23 DIAGNOSIS — E1151 Type 2 diabetes mellitus with diabetic peripheral angiopathy without gangrene: Secondary | ICD-10-CM | POA: Diagnosis not present

## 2017-07-23 DIAGNOSIS — I10 Essential (primary) hypertension: Secondary | ICD-10-CM

## 2017-07-23 NOTE — Patient Instructions (Signed)

## 2017-07-23 NOTE — Progress Notes (Signed)
Cardiology Office Note  Date:  07/23/2017   ID:  Sarah Payne, DOB 22-Apr-1946, MRN [id number]  PCP:  [name], NP   Chief Complaint  Patient presents with  . Other    over due 6 monht follow up. Patient current does not have any medications that Dr. Rockey Situ prescribed. Patient denies chest pain and SOB. Meds reviewed verbally with patient.     HPI:  72 year old woman with a long history of  smoking,  coronary artery disease,  prior stent x3 placed to the mid left circumflex with moderate to severe LAD disease ,  stent placed to her distal RCA in February 2013,  stent to the mid left circumflex in March 2013 ,  poorly controlled diabetes with hemoglobin A1c of 10,  depression,  hypertension,  cardiac catheterization 08/19/2012 showing moderate mid LAD disease at the takeoff of the diagonal vessel, severe ostial to mid RCA disease, ejection fraction greater than 55%. She had FFR pressure wire showing severe disease of the RCA with drug-eluting stent x2 placed. C. difficile late 2015 after antibiotics Previous history of running out of her medications She presents today for follow-up of her coronary artery disease, Recent hospitalization at [facility], cardiac catheterization performed showing patent stents Postop procedure complication of groin hematoma  In follow-up today she is not taking many of her medications Prescriptions ran out Came by our office December 28, they were renewed Still waiting for delivery from the [place]  Denies any chest pain or shortness of breath on exertion Continues to smoke 1 pack a day, starting a vapor cigarette  No regular exercise program Diabetes continues to be poorly controlled Lab work reviewed A1c 10.8  No recent lipid panel available, previous total cholesterol 300 Ran out of her Zetia and Crestor  EKG personally reviewed by myself on todays visit normal sinus rhythm with rate 82 bpm no significant ST or T-wave changes  Other past  medical history reviewed At baseline with Chronic severe back pain, Seen by neurosurgery.  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 Diagnosed with unstable angina. Had cardiac catheterization showing severe stable coronary disease, Echocardiogram with normal ejection fraction Total cholesterol 292  Since her discharge, she denies any further chest pain. She reports that she is taking her medications. Recently been to diabetes instruction classes, scheduled to start insulin. She has been compliant with her statin per the patient She has developed a cough since her discharge from the hospital. She does not want antibiotics given prior history of C. Difficile at the end of 2015  Details of the echocardiogram 08/01/2014; ejection fraction 55-60%, normal right ventricular systolic pressure, normal right heart systolic function Catheterization details, unchanged from prior catheterization. Lab report has not been signed off in the computer. Catheterization by Dr. Ellyn Hack. Medical management was recommended  Echocardiogram February 07, 2011 shows normal LV systolic function, diastolic dysfunction, moderate inferior wall hypokinesis, normal right ventricular systolic pressures.    PMH:   has a past medical  history of Cataract, Chronic low back pain, COPD (chronic obstructive pulmonary disease) ([place]), Coronary artery disease, Depression,  Diabetes mellitus, Diverticulosis, Frequent PVCs, GERD (gastroesophageal reflux disease), Hyperlipidemia, Hypertension, Hypothyroidism, Impingement syndrome of left shoulder, Marijuana abuse, Myocardial infarction [facility]), Sleep apnea, Takotsubo cardiomyopathy, Tendonitis of left rotator cuff, Tobacco abuse, and Vascular dementia.  PSH:    Past Surgical History:  Procedure Laterality Date  . ABDOMINAL HYSTERECTOMY    . APPENDECTOMY    . BACK SURGERY    . CARDIAC CATHETERIZATION  2013  . CARDIAC CATHETERIZATION  2016  . CARDIAC CATHETERIZATION N/A 01/01/2016   Procedure: Left Heart Cath and Coronary Angiography;  Surgeon: Jettie Booze, MD;  Location: [facility];  Service: Cardiovascular;  Laterality: N/A;  . CATARACT EXTRACTION W/PHACO Left 01/30/2015   Procedure: CATARACT EXTRACTION PHACO AND INTRAOCULAR LENS PLACEMENT (IOC);  Surgeon: Birder Robson, MD;  Location: [facility];  Service: Ophthalmology;  Laterality: Left;  [place] 00:47   . CATARACT EXTRACTION W/PHACO Right 02/13/2015   Procedure: CATARACT EXTRACTION PHACO AND INTRAOCULAR LENS PLACEMENT (IOC);  Surgeon: Birder Robson, MD;  Location: [facility];  Service: Ophthalmology;  Laterality: Right;  cassette lot # [id number] H [place]  00:29.9 AP  20.7 CDE  6.20  . COLONOSCOPY N/A 11/02/2014   Procedure: COLONOSCOPY;  Surgeon: Inda Castle, MD;  Location: [facility];  Service: Endoscopy;  Laterality: N/A;  . CORONARY ANGIOPLASTY  2012   stent x 3   . CORONARY ANGIOPLASTY WITH STENT PLACEMENT  2013    Current Outpatient Medications  Medication Sig Dispense Refill  . aspirin 81 MG tablet Take 81 mg by mouth daily.      . Calcium & Magnesium Carbonates (MYLANTA PO) Take 15 mLs by mouth as needed (for indegestion).     . carvedilol (COREG) 3.125 MG tablet Take 2 tablets (6.25 mg total) by mouth 2 (two) times daily with a meal. 360 tablet 3  . clopidogrel (PLAVIX) 75 MG tablet TAKE 1 TABLET (75 MG TOTAL) BY MOUTH DAILY. 90 tablet 3  .  cyclobenzaprine (FLEXERIL) 10 MG tablet Take 0.5-1 tablets (5-10 mg total) by mouth 3 (three) times daily as needed for muscle spasms. 30 tablet 0  . enalapril (VASOTEC) 10 MG tablet Take 1 tablet (10 mg total) by mouth daily. 90 tablet 3  . ezetimibe (ZETIA) 10 MG tablet Take 1 tablet (10 mg total) by mouth daily. 90 tablet 3  . glucose blood (FREESTYLE LITE) test strip Check blood sugar 5 times daily to check blood sugars - Uncontrolled diabetes mellitus 400 each 5  . glucose monitoring kit (FREESTYLE) monitoring kit 1 each by Does not apply route as needed for other. PHARMACY PLEASE SUBSTITUTE WHATEVER GLUCOMETER YOU CARRY 1 each 1  . insulin aspart (NOVOLOG FLEXPEN) 100 UNIT/ML FlexPen Inject 14-22 Units into the skin 3 (three) times daily with meals. Normally takes 12 units 15 pen 3  . Insulin Glargine (LANTUS SOLOSTAR) 100 UNIT/ML Solostar Pen Inject 40 Units into the skin at bedtime. 15 pen 3  . Insulin Pen Needle (INSUPEN PEN NEEDLES) 32G X 4 MM MISC Use to inject insulin 4 times daily. 250 each 1  . isosorbide mononitrate (IMDUR) 30 MG 24 hr tablet Take 1 tablet (30 mg total) by mouth daily. 90 tablet 3  . lansoprazole (PREVACID) 15 MG capsule Take 15 mg by mouth daily. Reported on 11/06/2015    . levothyroxine (SYNTHROID, LEVOTHROID) 125 MCG tablet Take 1 tablet (125 mcg total) by mouth daily. 90 tablet  3  . pantoprazole (PROTONIX) 40 MG tablet Take 1 tablet (40 mg total) by mouth daily. 90 tablet 3  . rosuvastatin (CRESTOR) 40 MG tablet Take 1 tablet (40 mg total) by mouth daily. 90 tablet 3  . sitaGLIPtin (JANUVIA) 100 MG tablet Take 1 tablet (100 mg total) by mouth daily. 90 tablet 3  . traZODone (DESYREL) 100 MG tablet Take 2 tablets (200 mg total) by mouth at bedtime. 180 tablet 3   No current facility-administered medications for this visit.      Allergies:   No known allergies   Social History:  The patient  reports that she has been smoking cigarettes.  She has a 45.00  pack-year smoking history. she has never used smokeless tobacco. She reports that she uses drugs. Drug: Marijuana. She reports that she does not drink alcohol.   Family History:   family history includes Colon cancer in her sister; Heart attack in her mother; Heart disease in her father and mother; Liver cancer in her brother; Throat cancer in her brother.    Review of Systems: Review of Systems  Constitutional: Negative.   Respiratory: Negative.   Cardiovascular: Negative.   Gastrointestinal: Negative.   Musculoskeletal: Negative.   Neurological: Negative.   Psychiatric/Behavioral: Negative.   All other systems reviewed and are negative.    PHYSICAL EXAM: VS:  BP (!) 154/82 (BP Location: Left Arm, Patient Position: Sitting, Cuff Size: Normal)   Pulse 64   Ht 5' 6.5" (1.689 m)   Wt 147 lb 8 oz (66.9 kg)   BMI 23.45 kg/m  , BMI Body mass index is 23.45 kg/m. GEN: Well nourished, well developed, in no acute distress  HEENT: normal  Neck: no JVD, + carotid bruits, no masses Cardiac: RRR; no murmurs, rubs, or gallops,no edema  Respiratory:  clear to auscultation bilaterally, normal work of breathing GI: soft, nontender, nondistended, + BS MS: no deformity or atrophy  Skin: warm and dry, no rash Neuro:  Strength and sensation are intact Psych: euthymic mood, full affect    Recent Labs: 04/17/2017: TSH 2.38    Lipid Panel Lab Results  Component Value Date   CHOL 301 (H) 06/02/2016   HDL 45.10 06/02/2016   LDLCALC 146 (H) 03/12/2015   TRIG 387.0 (H) 06/02/2016      Wt Readings from Last 3 Encounters:  07/23/17 147 lb 8 oz (66.9 kg)  07/17/17 147 lb 6.4 oz (66.9 kg)  06/23/17 146 lb 6.4 oz (66.4 kg)       ASSESSMENT AND PLAN:  CAD, multiple vessel -  Currently with no symptoms of angina. No further workup at this time. Continue current medication regimen.  Mixed hyperlipidemia -  Recommended that she stay on Crestor and Zetia  Diabetes mellitus type 2 with  peripheral artery disease ([place]) -  Working with endocrinology Numbers continue to be poorly controlled Taking insulin 4 times a day per the patient Prior history of medication compliance  Chronic systolic congestive heart failure (H[facility]) -  Currently not on medications, they have been renewed She is on ACE inhibitor, Imdur, carvedilol   Essential hypertension - Plan: EKG 12-Lead, VAS US CAROTID Blood pressure elevated as she is not taking her medications, they should arrive in the mail tomorrow  Tobacco abuse - Plan: EKG 12-Lead, VAS US CAROTID Still smoking .  Discussed with her again today We have encouraged her to continue to work on weaning her cigarettes and smoking cessation. She will continue to work on this and  does not want any assistance with chantix.   COPD, moderate ([place]) - Plan: EKG 12-Lead, VAS US CAROTID Smoking cessation recommended   Total encounter time more than 25 minutes  Greater than 50% was spent in counseling and coordination of care with the patient   Disposition:   F/U  6 months   No orders of the defined types were placed in this encounter.    Signed, Esmond Plants, M.D., Ph.D. 07/23/2017  [place], [place]

## 2017-08-25 ENCOUNTER — Ambulatory Visit
Payer: Medicare Other | Attending: Student in an Organized Health Care Education/Training Program | Admitting: Student in an Organized Health Care Education/Training Program

## 2017-08-25 ENCOUNTER — Encounter: Payer: Self-pay | Admitting: Student in an Organized Health Care Education/Training Program

## 2017-08-25 ENCOUNTER — Other Ambulatory Visit: Payer: Self-pay

## 2017-08-25 VITALS — BP 155/80 | HR 79 | Temp 98.0°F | Resp 16 | Ht 67.0 in | Wt 145.0 lb

## 2017-08-25 DIAGNOSIS — F4323 Adjustment disorder with mixed anxiety and depressed mood: Secondary | ICD-10-CM | POA: Diagnosis not present

## 2017-08-25 DIAGNOSIS — J449 Chronic obstructive pulmonary disease, unspecified: Secondary | ICD-10-CM | POA: Diagnosis not present

## 2017-08-25 DIAGNOSIS — K219 Gastro-esophageal reflux disease without esophagitis: Secondary | ICD-10-CM | POA: Insufficient documentation

## 2017-08-25 DIAGNOSIS — Z955 Presence of coronary angioplasty implant and graft: Secondary | ICD-10-CM | POA: Insufficient documentation

## 2017-08-25 DIAGNOSIS — I5022 Chronic systolic (congestive) heart failure: Secondary | ICD-10-CM | POA: Insufficient documentation

## 2017-08-25 DIAGNOSIS — F1290 Cannabis use, unspecified, uncomplicated: Secondary | ICD-10-CM | POA: Insufficient documentation

## 2017-08-25 DIAGNOSIS — M4802 Spinal stenosis, cervical region: Secondary | ICD-10-CM | POA: Insufficient documentation

## 2017-08-25 DIAGNOSIS — L280 Lichen simplex chronicus: Secondary | ICD-10-CM | POA: Insufficient documentation

## 2017-08-25 DIAGNOSIS — M545 Low back pain, unspecified: Secondary | ICD-10-CM

## 2017-08-25 DIAGNOSIS — G4700 Insomnia, unspecified: Secondary | ICD-10-CM | POA: Diagnosis not present

## 2017-08-25 DIAGNOSIS — M5106 Intervertebral disc disorders with myelopathy, lumbar region: Secondary | ICD-10-CM | POA: Diagnosis not present

## 2017-08-25 DIAGNOSIS — M5136 Other intervertebral disc degeneration, lumbar region: Secondary | ICD-10-CM | POA: Diagnosis not present

## 2017-08-25 DIAGNOSIS — Z800 Family history of malignant neoplasm of digestive organs: Secondary | ICD-10-CM | POA: Diagnosis not present

## 2017-08-25 DIAGNOSIS — E1169 Type 2 diabetes mellitus with other specified complication: Secondary | ICD-10-CM | POA: Insufficient documentation

## 2017-08-25 DIAGNOSIS — Z9114 Patient's other noncompliance with medication regimen: Secondary | ICD-10-CM | POA: Diagnosis not present

## 2017-08-25 DIAGNOSIS — I5181 Takotsubo syndrome: Secondary | ICD-10-CM | POA: Diagnosis not present

## 2017-08-25 DIAGNOSIS — G4733 Obstructive sleep apnea (adult) (pediatric): Secondary | ICD-10-CM | POA: Insufficient documentation

## 2017-08-25 DIAGNOSIS — I110 Hypertensive heart disease with heart failure: Secondary | ICD-10-CM | POA: Diagnosis not present

## 2017-08-25 DIAGNOSIS — F329 Major depressive disorder, single episode, unspecified: Secondary | ICD-10-CM | POA: Diagnosis not present

## 2017-08-25 DIAGNOSIS — I739 Peripheral vascular disease, unspecified: Secondary | ICD-10-CM | POA: Insufficient documentation

## 2017-08-25 DIAGNOSIS — I2510 Atherosclerotic heart disease of native coronary artery without angina pectoris: Secondary | ICD-10-CM | POA: Insufficient documentation

## 2017-08-25 DIAGNOSIS — F17210 Nicotine dependence, cigarettes, uncomplicated: Secondary | ICD-10-CM | POA: Insufficient documentation

## 2017-08-25 DIAGNOSIS — Z794 Long term (current) use of insulin: Secondary | ICD-10-CM | POA: Insufficient documentation

## 2017-08-25 DIAGNOSIS — M4716 Other spondylosis with myelopathy, lumbar region: Secondary | ICD-10-CM | POA: Diagnosis not present

## 2017-08-25 DIAGNOSIS — E039 Hypothyroidism, unspecified: Secondary | ICD-10-CM | POA: Diagnosis not present

## 2017-08-25 DIAGNOSIS — Z981 Arthrodesis status: Secondary | ICD-10-CM | POA: Diagnosis not present

## 2017-08-25 DIAGNOSIS — E785 Hyperlipidemia, unspecified: Secondary | ICD-10-CM | POA: Diagnosis not present

## 2017-08-25 DIAGNOSIS — K5730 Diverticulosis of large intestine without perforation or abscess without bleeding: Secondary | ICD-10-CM | POA: Diagnosis not present

## 2017-08-25 DIAGNOSIS — Z79899 Other long term (current) drug therapy: Secondary | ICD-10-CM | POA: Insufficient documentation

## 2017-08-25 DIAGNOSIS — G8929 Other chronic pain: Secondary | ICD-10-CM | POA: Diagnosis not present

## 2017-08-25 DIAGNOSIS — Z7982 Long term (current) use of aspirin: Secondary | ICD-10-CM | POA: Insufficient documentation

## 2017-08-25 DIAGNOSIS — I252 Old myocardial infarction: Secondary | ICD-10-CM | POA: Insufficient documentation

## 2017-08-25 MED ORDER — AMITRIPTYLINE HCL 25 MG PO TABS: ORAL_TABLET | ORAL | 1 refills | Status: DC

## 2017-08-25 MED ORDER — MELOXICAM 15 MG PO TABS: 15.0000 mg | ORAL_TABLET | Freq: Every day | ORAL | 0 refills | Status: AC

## 2017-08-25 NOTE — Progress Notes (Signed)
Patient's Name: Sarah Payne  MRN: [id number]  Referring Provider: [name], NP  DOB: 08/20/1945  PCP: [name], NP  DOS: 08/25/2017  Note by: Gillis Santa, MD  Service setting: Ambulatory outpatient  Specialty: Interventional Pain Management  Location: [facility] (AMB) Pain Management Facility  Visit type: Initial Patient Evaluation  Patient type: New Patient   Primary Reason(s) for Visit: Encounter for initial evaluation of one or more chronic problems (new to examiner) potentially causing chronic pain, and posing a threat to normal musculoskeletal function. (Level of risk: High) CC: Back Pain (lower)  HPI  Sarah Payne is a 72 y.o. year old, female patient, who comes today to see [place] for the first time for an initial evaluation of her chronic pain. She has CAD, multiple vessel; Tobacco abuse; Hyperlipidemia; Hypothyroidism; Depression; History of lumbar discectomy; Diabetes mellitus type 2 with peripheral artery disease ([place]); Lichen sclerosus et atrophicus; Bronchitis, chronic obstructive ([place]); Chronic systolic congestive heart failure ([place]); Obstructive sleep apnea; Noncompliance with diabetes treatment; COPD, moderate ([place]); Diverticulosis of colon without hemorrhage; Esophageal reflux; Adjustment disorder with mixed anxiety and depressed mood; Benign neoplasm of colon; Family history of colon cancer; Internal hemorrhoids; HTN (hypertension); Emesis; Acute systolic (congestive) heart failure ([place]); Takotsubo cardiomyopathy; Frequent PVCs; Insomnia; and Chronic bilateral back pain on their problem list. Today she comes in for evaluation of her Back Pain (lower)  Pain Assessment: Location: Lower Back Radiating: hips/buttocks bilateral Onset: More than a month ago Duration: Chronic pain Quality: Aching, Sharp, Discomfort(severe pain, deep) Severity:  3/10 (self-reported pain score)  Note: Reported level is compatible with observation.                         When using our objective  Pain Scale, levels between 6 and 10/10 are said to belong in an emergency room, as it progressively worsens from a 6/10, described as severely limiting, requiring emergency care not usually available at an outpatient pain management facility. At a 6/10 level, communication becomes difficult and requires great effort. Assistance to reach the emergency department may be required. Facial flushing and profuse sweating along with potentially dangerous increases in heart rate and blood pressure will be evident. Effect on ADL: 'I cant do anything" Timing: Constant Modifying factors: OTC   Onset and Duration: Present longer than 3 months Cause of pain: Unknown Severity: No change since onset Timing: During activity or exercise and After activity or exercise Aggravating Factors: Bending, Lifiting, Motion, Prolonged standing and Twisting Alleviating Factors: Hot packs, Lying down and TENS Associated Problems: Fatigue and Pain that does not allow patient to sleep Quality of Pain: Agonizing, Deep, Disabling, Pressure-like, Sharp and Throbbing Previous Examinations or Tests: MRI scan and X-rays Previous Treatments: Relaxation therapy and TENS  The patient comes into the clinics today for the first time for a chronic pain management evaluation.   72 year old female who presents with a chief complaint of axial low back pain that radiates into bilateral buttocks occasionally.  Patient has a history of a posterior fusion from L3-S1 over 10 years ago.  Patient describes a very focal area that is exquisitely painful especially with lumbar extension.  She has been taking ibuprofen and Tylenol for her pain.  Her last opioid fill was September 16, 2016 for Percocet 5 mg, quantity 60.  Her previous pain doctor was in [place] but this was too far for her.  Patient's prior urine drug screens have been positive for THC.  She states  that this was a one-time event however when asked about obtaining a UDS on today's visit,  patient stated that it would be positive for THC as well.  Upon further discussion, patient states that she does smoke marijuana to help out with her pain symptoms.  Is also helps out with her nausea as well.  Patient does have a very significant cardiac history which includes coronary artery disease status post 7 MIs with over 4 stents in place.  Patient is currently anticoagulated with Plavix has been on this medication for many years and has never been off of it.  Today I took the time to provide the patient with information regarding my pain practice. The patient was informed that my practice is divided into two sections: an interventional pain management section, as well as a completely separate and distinct medication management section. I explained that I have procedure days for my interventional therapies, and evaluation days for follow-ups and medication management. Because of the amount of documentation required during both, they are kept separated. This means that there is the possibility that she may be scheduled for a procedure on one day, and medication management the next. I have also informed her that because of staffing and facility limitations, I no longer take patients for medication management only. To illustrate the reasons for this, I gave the patient the example of surgeons, and how inappropriate it would be to refer a patient to his/her care, just to write for the post-surgical antibiotics on a surgery done by a different surgeon.   Because interventional pain management is my board-certified specialty, the patient was informed that joining my practice means that they are open to any and all interventional therapies. I made it clear that this does not mean that they will be forced to have any procedures done. What this means is that I believe interventional therapies to be essential part of the diagnosis and proper management of chronic pain conditions. Therefore, patients not  interested in these interventional alternatives will be better served under the care of a different practitioner.  The patient was also made aware of my Comprehensive Pain Management Safety Guidelines where by joining my practice, they limit all of their nerve blocks and joint injections to those done by our practice, for as long as we are retained to manage their care.   Historic Controlled Substance Pharmacotherapy Review  PMP and historical list of controlled substances:  Her last opioid fill was September 16, 2016 for Percocet 5 mg, quantity 60  MME/day: 15 mg/day Medications: The patient did not bring the medication(s) to the appointment, as requested in our "New Patient Package" Pharmacodynamics: Desired effects: Analgesia: The patient reports >50% benefit. Reported improvement in function: The patient reports medication allows her to accomplish basic ADLs. Clinically meaningful improvement in function (CMIF): Sustained CMIF goals met Perceived effectiveness: Described as relatively effective, allowing for increase in activities of daily living (ADL) Undesirable effects: Side-effects or Adverse reactions: None reported Historical Monitoring: The patient  reports that she uses drugs. Drug: Marijuana. List of all UDS Test(s): Lab Results  Component Value Date   MDMA NONE DETECTED 02/13/2015   MDMA NEGATIVE 08/19/2013   COCAINSCRNUR NONE DETECTED 02/13/2015   COCAINSCRNUR NEGATIVE 08/19/2013   COCAINSCRNUR NEG 05/30/2013   PCPSCRNUR NONE DETECTED 02/13/2015   PCPSCRNUR NEGATIVE 08/19/2013   PCPSCRNUR NEG 05/30/2013   THCU POSITIVE (A) 02/13/2015   THCU POSITIVE 08/19/2013   List of other Serum/Urine Drug Screening Test(s):  Lab Results  Component  Value Date   COCAINSCRNUR NONE DETECTED 02/13/2015   COCAINSCRNUR NEGATIVE 08/19/2013   COCAINSCRNUR NEG 05/30/2013   THCU POSITIVE (A) 02/13/2015   THCU POSITIVE 08/19/2013   Historical Background Evaluation: [place] PMP: Six (6) year  initial data search conducted.             [place] Department of public safety, offender search: Editor, commissioning Information) Non-contributory Risk Assessment Profile: Aberrant behavior: None observed or detected today Risk factors for fatal opioid overdose: None identified today Fatal overdose hazard ratio (HR): Calculation deferred Non-fatal overdose hazard ratio (HR): Calculation deferred Risk of opioid abuse or dependence: 0.7-3.0% with doses ? 36 MME/day and 6.1-26% with doses ? 120 MME/day. Substance use disorder (SUD) risk level: Moderate Opioid risk tool (ORT) (Total Score): 0 Opioid Risk Tool - 08/25/17 1337      Family History of Substance Abuse   Alcohol  Negative    Illegal Drugs  Negative    Rx Drugs  Negative      Personal History of Substance Abuse   Alcohol  Negative    Illegal Drugs  Negative    Rx Drugs  Negative      Psychological Disease   Psychological Disease  Negative      Total Score   Opioid Risk Tool Scoring  0    Opioid Risk Interpretation  Low Risk      ORT Scoring interpretation table:  Score <3 = Low Risk for SUD  Score between 4-7 = Moderate Risk for SUD  Score >8 = High Risk for Opioid Abuse   PHQ-2 Depression Scale:  Total score: 0  PHQ-2 Scoring interpretation table: (Score and probability of major depressive disorder)  Score 0 = No depression  Score 1 = 15.4% Probability  Score 2 = 21.1% Probability  Score 3 = 38.4% Probability  Score 4 = 45.5% Probability  Score 5 = 56.4% Probability  Score 6 = 78.6% Probability   PHQ-9 Depression Scale:  Total score: 0  PHQ-9 Scoring interpretation table:  Score 0-4 = No depression  Score 5-9 = Mild depression  Score 10-14 = Moderate depression  Score 15-19 = Moderately severe depression  Score 20-27 = Severe depression (2.4 times higher risk of SUD and 2.89 times higher risk of overuse)   Pharmacologic Plan: No opioid analgesics. Sarah Payne states being against the use of "opioid analgesics", as part of  her treatment plan. Initial impression: Poor candidate for opioid analgesics.  Concomitant marijuana use.  We will not be candidate for opioid therapy at our clinic.  However I will continue to support her and manage her pain through non-opioid analgesics.  Meds   Current Outpatient Medications:  .  aspirin 81 MG tablet, Take 81 mg by mouth daily.  , Disp: , Rfl:  .  carvedilol (COREG) 3.125 MG tablet, Take 2 tablets (6.25 mg total) by mouth 2 (two) times daily with a meal., Disp: 360 tablet, Rfl: 3 .  clopidogrel (PLAVIX) 75 MG tablet, TAKE 1 TABLET (75 MG TOTAL) BY MOUTH DAILY., Disp: 90 tablet, Rfl: 3 .  cyclobenzaprine (FLEXERIL) 10 MG tablet, Take 0.5-1 tablets (5-10 mg total) by mouth 3 (three) times daily as needed for muscle spasms., Disp: 30 tablet, Rfl: 0 .  enalapril (VASOTEC) 10 MG tablet, Take 1 tablet (10 mg total) by mouth daily., Disp: 90 tablet, Rfl: 3 .  ezetimibe (ZETIA) 10 MG tablet, Take 1 tablet (10 mg total) by mouth daily., Disp: 90 tablet, Rfl: 3 .  glucose blood (  FREESTYLE LITE) test strip, Check blood sugar 5 times daily to check blood sugars - Uncontrolled diabetes mellitus, Disp: 400 each, Rfl: 5 .  glucose monitoring kit (FREESTYLE) monitoring kit, 1 each by Does not apply route as needed for other. PHARMACY PLEASE SUBSTITUTE WHATEVER GLUCOMETER YOU CARRY, Disp: 1 each, Rfl: 1 .  insulin aspart (NOVOLOG FLEXPEN) 100 UNIT/ML FlexPen, Inject 14-22 Units into the skin 3 (three) times daily with meals. Normally takes 12 units, Disp: 15 pen, Rfl: 3 .  Insulin Glargine (LANTUS SOLOSTAR) 100 UNIT/ML Solostar Pen, Inject 40 Units into the skin at bedtime., Disp: 15 pen, Rfl: 3 .  Insulin Pen Needle (INSUPEN PEN NEEDLES) 32G X 4 MM MISC, Use to inject insulin 4 times daily., Disp: 250 each, Rfl: 1 .  isosorbide mononitrate (IMDUR) 30 MG 24 hr tablet, Take 1 tablet (30 mg total) by mouth daily., Disp: 90 tablet, Rfl: 3 .  lansoprazole (PREVACID) 15 MG capsule, Take 15 mg by  mouth daily. Reported on 11/06/2015, Disp: , Rfl:  .  levothyroxine (SYNTHROID, LEVOTHROID) 125 MCG tablet, Take 1 tablet (125 mcg total) by mouth daily., Disp: 90 tablet, Rfl: 3 .  pantoprazole (PROTONIX) 40 MG tablet, Take 1 tablet (40 mg total) by mouth daily., Disp: 90 tablet, Rfl: 3 .  rosuvastatin (CRESTOR) 40 MG tablet, Take 1 tablet (40 mg total) by mouth daily., Disp: 90 tablet, Rfl: 3 .  sitaGLIPtin (JANUVIA) 100 MG tablet, Take 1 tablet (100 mg total) by mouth daily., Disp: 90 tablet, Rfl: 3 .  traZODone (DESYREL) 100 MG tablet, Take 2 tablets (200 mg total) by mouth at bedtime., Disp: 180 tablet, Rfl: 3 .  amitriptyline (ELAVIL) 25 MG tablet, 25 mg nightly for 2 weeks then 50 mg nightly, Disp: 60 tablet, Rfl: 1 .  Calcium & Magnesium Carbonates (MYLANTA PO), Take 15 mLs by mouth as needed (for indegestion). , Disp: , Rfl:  .  meloxicam (MOBIC) 15 MG tablet, Take 1 tablet (15 mg total) by mouth daily., Disp: 30 tablet, Rfl: 0  Imaging Review  Cervical Imaging: Cervical MR wo contrast:  Results for orders placed during the hospital encounter of 07/09/12  MR Cervical Spine Wo Contrast   Narrative *RADIOLOGY REPORT*  Clinical Data: 72 year old female with severe headache.  Cervical spine disease, bilateral hand numbness and headaches.  MRI CERVICAL SPINE WITHOUT CONTRAST  Technique:  Multiplanar and multiecho pulse sequences of the cervical spine, to include the craniocervical junction and cervicothoracic junction, were obtained according to standard protocol without intravenous contrast.  Comparison: [facility] cervical radiographs 11/26/2011.  Findings: Cervicomedullary junction is within normal limits. Spinal cord signal is within normal limits at all visualized levels.  Visualized paraspinal soft tissues are within normal limits.  Vertebral height and alignment within normal limits. No marrow edema or evidence of acute osseous abnormality.  C2-C3:  Severe right  facet hypertrophy.  Mild to moderate ligament flavum hypertrophy.  No spinal stenosis.  Mild to moderate right C3 foraminal stenosis.  C3-C4:  Small cephalad disc protrusion with annular tear. Narrowing the ventral CSF space but no significant spinal stenosis. Mild to moderate right facet hypertrophy.  No significant foraminal stenosis.  C4-C5:  Mild circumferential disc bulge.  Right greater than left uncovertebral hypertrophy.  Mild facet hypertrophy also greater on the right.  No spinal stenosis.  Moderate to severe right and mild left C5 foraminal stenosis.  C5-C6:  Small caudal central disc protrusion with annular tear. Mild ligament flavum hypertrophy.  No significant spinal  stenosis. Mild facet and uncovertebral hypertrophy greater on the left. Moderate left C6 foraminal stenosis.  C6-C7:  Minimal disc bulge.  No stenosis.  C7-T1:  Mild disc bulge.  Right greater than left uncovertebral hypertrophy.  No significant stenosis. Negative visualized upper thoracic levels.  IMPRESSION: 1.  Severe facet degeneration at C2-C3 on the right. Multilevel other cervical facet degeneration is less pronounced.  Moderate right C3 and moderate to severe right C5 and left C6 foraminal stenosis. 2.  Multiple small cervical disc herniations.  No significant cervical spinal stenosis.   Original Report Authenticated By: Roselyn Reef, M.D.    Cervical DG complete:  Results for orders placed during the hospital encounter of 11/26/11  DG Cervical Spine Complete   Narrative *RADIOLOGY REPORT*  Clinical Data: Recurrent occipital headache and neck pain  CERVICAL SPINE - COMPLETE 4+ VIEW  Comparison: None.  Findings: Five views of the cervical spine submitted.  No acute fracture or subluxation.  C1-C2 relationship is unremarkable. There is mild disc space flattening with mild anterior spurring at C5-C6 level.  Minimal disc space flattening noted at C4-C5 and C6- C7 level.  Mild disc  space flattening with mild anterior spurring noted at C7-T1 level.  Mild anterior spurring noted lower endplate of C4 and C6 vertebral body. Bilateral mild narrowing of the neural foramina at C5-C6 level.  No prevertebral soft tissue swelling.  Cervical airway is patent.  IMPRESSION:  No acute fracture or subluxation.  Degenerative changes as described above.  Original Report Authenticated By: Lahoma Crocker, M.D.  it. Lumbar CT w contrast:  Results for orders placed in visit on 01/12/02  CT Lumbar Spine W Contrast   Narrative FINDINGS CLINICAL DATA:  BACK PAIN WITHOUT RADIATION. LUMBAR MYELOGRAM FOLLOWING INFORMED CONSENT, STERILE PREPARATION OF THE BACK, AND ADEQUATE LOCAL ANESTHESIA, A LUMBAR PUNCTURE WAS PERFORMED USING A 22 GAUGE NEEDLE AT L2-3 FROM A LEFT PARASPINOUS APPROACH. FLUID WAS CLEAR AND COLORLESS.  15 CC OF OMNIPAQUE WAS INSTILLED INTO THE SUBARACHNOID SPACE.  AP, LATERAL, AND OBLIQUE VIEWS DEMONSTRATE MILD WAIST-LIKE NARROWING AT L4-5 WITH MILD EFFACEMENT OF BOTH L5 ROOTS.  NO OTHER AREAS OF SPINAL STENOSIS ARE SEEN.  THERE IS DISK SPACE NARROWING AT L3-4 AND L4-5.  A MODERATE SIZED VENTRAL DEFECT IS SEEN AT L4-5 REPRESENTING DISK MATERIAL.  A SIMILAR SLIGHTLY LESS PROMINENT DEFECT IS SEEN AT L3-4.   MILD ANNULAR BULGING IS IDENTIFIED AT L2-3. FLEXION/EXTENSION SHOW FAIR RANGE OF MOTION WITHOUT ABNORMAL MOVEMENT. POST MYELOGRAM CT THE LOWER FIVE DISK SPACES WERE EXAMINED: L1-2:  NORMAL INTERSPACE. L2-3:  MILD ANNULAR BULGING.  NO STENOSIS OR DISK PROTRUSION. L3-4:  MILD FACET HYPERTROPHY.  BROAD-BASED CENTRAL DISK PROTRUSION.  NO L4 NERVE ROOT ENCROACHMENT. SOME AIR IS SEEN IN THE FORAMEN AT L3-4 ON THE LEFT.  I SUSPECT THIS IS RELATED TO MY LUMBAR PUNCTURE. L4-5:  BROAD-BASED DISK PROTRUSION CENTRALLY WITH ASSOCIATED POSTERIOR ELEMENT HYPERTROPHY.  MILD BILATERAL L5 NERVE ROOT EFFACEMENT IS SEEN. L5-S1:  NORMAL INTERSPACE. IMPRESSION 1)    BROAD-BASED DISK  PROTRUSION L4-5 CENTRALLY WITH POSTERIOR ELEMENT HYPERTROPHY; MILD BILATERAL L5 NERVE ROOT ENCROACHMENT IS PRESENT. 2)   SIMILAR, BUT SMALLER DISK PROTRUSION L3-4 WITHOUT L4 NERVE ROOT ENCROACHMENT. 3)   MILD ANNULAR BULGING L2-3.   Results for orders placed during the hospital encounter of 08/27/11  DG Lumbar Spine 2-3 Views   Narrative *RADIOLOGY REPORT*  Clinical Data: 72 year old female with back pain radiating to both lower extremities.  LUMBAR SPINE - 2-3 VIEW  Comparison: None.  Findings: Normal lumbar segmentation.  Previous transpedicular and interbody fusion from L3-L4 to L5-S1 plus decompression. Transpedicular hardware appears intact.  Severe disc space loss, vacuum disc phenomena, and endplate spurring at X0-R6.  Negative visualized sacrum and SI joints.  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  Original Report Authenticated By: Randall An, M.D.   Lumbar DG (Complete) 4+V:  Results for orders placed during the hospital encounter of 05/06/16  DG Lumbar Spine Complete   Narrative CLINICAL DATA:  Low back pain  EXAM: LUMBAR SPINE - COMPLETE 4+ VIEW  COMPARISON:  Abdominal radiograph 04/04/2016  FINDINGS: There is posterior fusion hardware extending from L3-S1 with bilateral transpedicular screws and spinal rods, with a crossbar at the L3-4 level. There is disc spacer material at L3-4, L4-5 and L5-S1. Alignment at diffuse segments is normal. There is no clear evidence of hardware loosening or infection.  There is marked narrowing of the intervertebral disc space at L2-L3 with associated formation of an anterior osteophyte. No acute fracture. The sacroiliac joints and visualized pelvic structures are unremarkable.  IMPRESSION: 1. Posterior spinal fusion from L3-S1 in normal alignment. No hardware abnormality. 2. 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   Electronically Signed   By: Ulyses Jarred M.D.   On: 05/06/2016 14:18     Lumbar DG Myelogram views:  Results for orders placed in visit on 01/12/02  DG Myelogram Lumbar   Narrative FINDINGS CLINICAL DATA:  BACK PAIN WITHOUT RADIATION. LUMBAR MYELOGRAM FOLLOWING INFORMED CONSENT, STERILE PREPARATION OF THE BACK, AND ADEQUATE LOCAL ANESTHESIA, A LUMBAR PUNCTURE WAS PERFORMED USING A 22 GAUGE NEEDLE AT L2-3 FROM A LEFT PARASPINOUS APPROACH. FLUID WAS CLEAR AND COLORLESS.  15 CC OF OMNIPAQUE WAS INSTILLED INTO THE SUBARACHNOID SPACE.  AP, LATERAL, AND OBLIQUE VIEWS DEMONSTRATE MILD WAIST-LIKE NARROWING AT L4-5 WITH MILD EFFACEMENT OF BOTH L5 ROOTS.  NO OTHER AREAS OF SPINAL STENOSIS ARE SEEN.  THERE IS DISK SPACE NARROWING AT L3-4 AND L4-5.  A MODERATE SIZED VENTRAL DEFECT IS SEEN AT L4-5 REPRESENTING DISK MATERIAL.  A SIMILAR SLIGHTLY LESS PROMINENT DEFECT IS SEEN AT L3-4.   MILD ANNULAR BULGING IS IDENTIFIED AT L2-3. FLEXION/EXTENSION SHOW FAIR RANGE OF MOTION WITHOUT ABNORMAL MOVEMENT. POST MYELOGRAM CT THE LOWER FIVE DISK SPACES WERE EXAMINED: L1-2:  NORMAL INTERSPACE. L2-3:  MILD ANNULAR BULGING.  NO STENOSIS OR DISK PROTRUSION. L3-4:  MILD FACET HYPERTROPHY.  BROAD-BASED CENTRAL DISK PROTRUSION.  NO L4 NERVE ROOT ENCROACHMENT. SOME AIR IS SEEN IN THE FORAMEN AT L3-4 ON THE LEFT.  I SUSPECT THIS IS RELATED TO MY LUMBAR PUNCTURE. L4-5:  BROAD-BASED DISK PROTRUSION CENTRALLY WITH ASSOCIATED POSTERIOR ELEMENT HYPERTROPHY.  MILD BILATERAL L5 NERVE ROOT EFFACEMENT IS SEEN. L5-S1:  NORMAL INTERSPACE. IMPRESSION 1)    BROAD-BASED DISK PROTRUSION L4-5 CENTRALLY WITH POSTERIOR ELEMENT HYPERTROPHY; MILD BILATERAL L5 NERVE ROOT ENCROACHMENT IS PRESENT. 2)   SIMILAR, BUT SMALLER DISK PROTRUSION L3-4 WITHOUT L4 NERVE ROOT ENCROACHMENT. 3)   MILD ANNULAR BULGING L2-3.    Complexity Note: Imaging results reviewed. Results shared with Sarah Payne, using  Layman's terms.                         ROS  Cardiovascular History: Heart trouble, High blood pressure, Heart attack ( Date: has had 7; last one was 2 years ago), Heart surgery and Blood thinners:  Anticoagulant Pulmonary or Respiratory History: Smoking  and Snoring  Neurological History: No reported neurological signs or symptoms such as seizures, abnormal skin sensations, urinary and/or fecal incontinence, being born with an abnormal open spine and/or a tethered spinal cord Review of Past Neurological Studies: No results found for this or any previous visit. Psychological-Psychiatric History: No reported psychological or psychiatric signs or symptoms such as difficulty sleeping, anxiety, depression, delusions or hallucinations (schizophrenial), mood swings (bipolar disorders) or suicidal ideations or attempts Gastrointestinal History: No reported gastrointestinal signs or symptoms such as vomiting or evacuating blood, reflux, heartburn, alternating episodes of diarrhea and constipation, inflamed or scarred liver, or pancreas or irrregular and/or infrequent bowel movements Genitourinary History: No reported renal or genitourinary signs or symptoms such as difficulty voiding or producing urine, peeing blood, non-functioning kidney, kidney stones, difficulty emptying the bladder, difficulty controlling the flow of urine, or chronic kidney disease Hematological History: Brusing easily Endocrine History: No reported endocrine signs or symptoms such as high or low blood sugar, rapid heart rate due to high thyroid levels, obesity or weight gain due to slow thyroid or thyroid disease Rheumatologic History: No reported rheumatological signs and symptoms such as fatigue, joint pain, tenderness, swelling, redness, heat, stiffness, decreased range of motion, with or without associated rash Musculoskeletal History: Negative for myasthenia gravis, muscular dystrophy, multiple sclerosis or malignant  hyperthermia Work History: Disabled  Allergies  Sarah Payne is allergic to no known allergies.  Laboratory Chemistry  Inflammation Markers (CRP: Acute Phase) (ESR: Chronic Phase) Lab Results  Component Value Date   CRP 2.1 04/11/2013   ESRSEDRATE 58 (H) 04/11/2013                 Rheumatology Markers No results found for: Elayne Guerin, [facility]              Renal Function Markers Lab Results  Component Value Date   BUN 15 06/02/2016   CREATININE 1.04 06/02/2016   GFRAA >60 01/09/2016   GFRNONAA >60 01/09/2016                 Hepatic Function Markers Lab Results  Component Value Date   AST 15 06/02/2016   ALT 15 06/02/2016   ALBUMIN 4.3 06/02/2016   ALKPHOS 110 06/02/2016   HCVAB NEGATIVE 03/12/2015   AMYLASE 32 04/11/2016   LIPASE 55.0 04/11/2016                 Electrolytes Lab Results  Component Value Date   NA 136 06/02/2016   K 4.2 06/02/2016   CL 96 06/02/2016   CALCIUM 10.2 06/02/2016   MG 1.1 (L) 06/12/2014                 Neuropathy Markers Lab Results  Component Value Date   VITAMINB12 542 10/13/2011   HGBA1C 10.8 07/17/2017                 Bone Pathology Markers Lab Results  Component Value Date   VD25OH 41 02/25/2013                 Coagulation Parameters Lab Results  Component Value Date   INR 1.01 01/09/2016   LABPROT 13.5 01/09/2016   APTT 109 (H) 01/01/2016   PLT 289.0 06/02/2016                 Cardiovascular Markers Lab Results  Component Value Date   BNP 91 08/01/2014   CKTOTAL 272 (H) 08/01/2014   CKMB 4.5 (H) 08/01/2014  TROPONINI <0.03 01/09/2016   HGB 14.7 06/02/2016   HCT 43.7 06/02/2016                 CA Markers No results found for: CEA, CA125, LABCA2               Note: Lab results reviewed.  [place]  Drug: Sarah Payne  reports that she uses drugs. Drug: Marijuana. Alcohol:  reports that she does not drink alcohol. Tobacco:  reports that she has been smoking cigarettes.  She has  a 45.00 pack-year smoking history. she has never used smokeless tobacco. Medical:  has a past medical history of Cataract, Chronic low back pain, COPD (chronic obstructive pulmonary disease) ([place]), Coronary artery disease, Depression, Diabetes mellitus, Diverticulosis, Frequent PVCs, GERD (gastroesophageal reflux disease), Hyperlipidemia, Hypertension, Hypothyroidism, Impingement syndrome of left shoulder, Marijuana abuse, Myocardial infarction [facility]), Sleep apnea, Takotsubo cardiomyopathy, Tendonitis of left rotator cuff, Tobacco abuse, and Vascular dementia. Family: family history includes Colon cancer in her sister; Heart attack in her mother; Heart disease in her father and mother; Liver cancer in her brother; Throat cancer in her brother.  Past Surgical History:  Procedure Laterality Date  . ABDOMINAL HYSTERECTOMY    . APPENDECTOMY    . BACK SURGERY    . CARDIAC CATHETERIZATION  2013  . CARDIAC CATHETERIZATION  2016  . CARDIAC CATHETERIZATION N/A 01/01/2016   Procedure: Left Heart Cath and Coronary Angiography;  Surgeon: Jettie Booze, MD;  Location: [facility];  Service: Cardiovascular;  Laterality: N/A;  . CATARACT EXTRACTION W/PHACO Left 01/30/2015   Procedure: CATARACT EXTRACTION PHACO AND INTRAOCULAR LENS PLACEMENT (IOC);  Surgeon: Birder Robson, MD;  Location: [facility];  Service: Ophthalmology;  Laterality: Left;  [place] 00:47   . CATARACT EXTRACTION W/PHACO Right 02/13/2015   Procedure: CATARACT EXTRACTION PHACO AND INTRAOCULAR LENS PLACEMENT (IOC);  Surgeon: Birder Robson, MD;  Location: [facility];  Service: Ophthalmology;  Laterality: Right;  cassette lot # [id number] H [place]  00:29.9 AP  20.7 CDE  6.20  . COLONOSCOPY N/A 11/02/2014   Procedure: COLONOSCOPY;  Surgeon: Inda Castle, MD;  Location: [facility];  Service: Endoscopy;  Laterality: N/A;  . CORONARY ANGIOPLASTY  2012   stent x 3   . CORONARY ANGIOPLASTY WITH STENT PLACEMENT  2013   Active Ambulatory Problems     Diagnosis Date Noted  . CAD, multiple vessel 02/24/2011  . Tobacco abuse 02/24/2011  . Hyperlipidemia 02/24/2011  . Hypothyroidism 05/03/2011  . Depression 06/24/2011  . History of lumbar discectomy 06/29/2011  . Diabetes mellitus type 2 with peripheral artery disease ([place]) 08/28/2011  . Lichen sclerosus et atrophicus 05/20/2012  . Bronchitis, chronic obstructive ([place]) 05/20/2012  . Chronic systolic congestive heart failure ([place]) 05/21/2012  . Obstructive sleep apnea 02/25/2013  . Noncompliance with diabetes treatment 02/27/2013  . COPD, moderate ([place]) 03/29/2013  . Diverticulosis of colon without hemorrhage 05/17/2014  . Esophageal reflux 05/23/2014  . Adjustment disorder with mixed anxiety and depressed mood 08/16/2014  . Benign neoplasm of colon 11/02/2014  . Family history of colon cancer 11/02/2014  . Internal hemorrhoids 11/02/2014  . HTN (hypertension) 09/20/2015  . Emesis 10/12/2015  . Acute systolic (congestive) heart failure ([place]) 01/01/2016  . Takotsubo cardiomyopathy 01/02/2016  . Frequent PVCs 01/02/2016  . Insomnia 01/12/2017  . Chronic bilateral back pain 05/19/2017   Resolved Ambulatory Problems    Diagnosis Date Noted  . Dizziness 02/24/2011  . Fatigue 03/12/2011  . Non-ST elevation myocardial infarction (NSTEMI), subendocardial infarction,  subsequent episode of care ([place]) 09/23/2011  . Angina at rest [facility]) 09/25/2011  . Headache(784.0) 10/15/2011  . Diarrhea 10/15/2011  . Gingivitis, acute, plaque induced 05/20/2012  . Nausea and vomiting in adult 07/03/2012  . Other malaise and fatigue 10/06/2012  . Leg weakness, bilateral 10/07/2012  . Gastritis and gastroduodenitis 02/27/2013  . Sweating 03/24/2013  . Malaise 03/24/2013  . COPD exacerbation ([place]) 03/29/2013  . Myalgia 04/06/2013  . Pain in joint, upper arm 04/12/2013  . Positive urine drug screen 11/22/2013  . Pre-operative clearance 03/10/2014  . Grief 03/10/2014  . Loss of weight 03/10/2014  .  Diarrhea 03/10/2014  . Impingement syndrome of left shoulder   . Tendonitis of left rotator cuff   . Vomiting and diarrhea 05/17/2014  . Nausea with vomiting 05/23/2014  . Orthostatic hypotension 08/08/2014  . Medicare annual wellness visit, subsequent 03/12/2015  . Rash and nonspecific skin eruption 04/19/2015  . Fatigue 06/01/2015  . Nausea with vomiting 06/05/2015  . Lower abdominal pain 08/07/2015  . Midline low back pain without sciatica 08/10/2015  . Diarrhea 09/11/2015  . Nausea without vomiting 09/11/2015  . Epigastric pain 10/12/2015  . Vaginitis and vulvovaginitis 12/03/2015  . Pain in the chest   . Acute stress reaction 01/02/2016  . Diarrhea 02/05/2016  . RLQ abdominal pain 02/05/2016  . Lumbar pain 05/06/2016  . Medicare annual wellness visit, subsequent 06/02/2016   Past Medical History:  Diagnosis Date  . Cataract   . Chronic low back pain   . COPD (chronic obstructive pulmonary disease) ([place])   . Coronary artery disease   . Depression   . Diabetes mellitus   . Diverticulosis   . Frequent PVCs   . GERD (gastroesophageal reflux disease)   . Hyperlipidemia   . Hypertension   . Hypothyroidism   . Impingement syndrome of left shoulder   . Marijuana abuse   . Myocardial infarction ([place])   . Sleep apnea   . Takotsubo cardiomyopathy   . Tendonitis of left rotator cuff   . Tobacco abuse   . Vascular dementia    Constitutional Exam  General appearance: Well nourished, well developed, and well hydrated. In no apparent acute distress Vitals:   08/25/17 1325  BP: (!) 155/80  Pulse: 79  Resp: 16  Temp: 98 F (36.7 C)  SpO2: 100%  Weight: 145 lb (65.8 kg)  Height: 5' 7" (1.702 m)   BMI Assessment: Estimated body mass index is 22.71 kg/m as calculated from the following:   Height as of this encounter: 5' 7" (1.702 m).   Weight as of this encounter: 145 lb (65.8 kg).  BMI interpretation table: BMI level Category Range association with higher incidence  of chronic pain  <18 kg/m2 Underweight   18.5-24.9 kg/m2 Ideal body weight   25-29.9 kg/m2 Overweight Increased incidence by 20%  30-34.9 kg/m2 Obese (Class I) Increased incidence by 68%  35-39.9 kg/m2 Severe obesity (Class II) Increased incidence by 136%  >40 kg/m2 Extreme obesity (Class III) Increased incidence by 254%   BMI Readings from Last 4 Encounters:  08/25/17 22.71 kg/m  07/23/17 23.45 kg/m  07/17/17 23.79 kg/m  06/23/17 23.63 kg/m   Wt Readings from Last 4 Encounters:  08/25/17 145 lb (65.8 kg)  07/23/17 147 lb 8 oz (66.9 kg)  07/17/17 147 lb 6.4 oz (66.9 kg)  06/23/17 146 lb 6.4 oz (66.4 kg)  Psych/Mental status: Alert, oriented x 3 (person, place, & time)       Eyes: PERLA Respiratory:  No evidence of acute respiratory distress  Cervical Spine Area Exam  Skin & Axial Inspection: No masses, redness, edema, swelling, or associated skin lesions Alignment: Symmetrical Functional ROM: Unrestricted ROM      Stability: No instability detected Muscle Tone/Strength: Functionally intact. No obvious neuro-muscular anomalies detected. Sensory (Neurological): Unimpaired Palpation: No palpable anomalies              Upper Extremity (UE) Exam    Side: Right upper extremity  Side: Left upper extremity  Skin & Extremity Inspection: Skin color, temperature, and hair growth are WNL. No peripheral edema or cyanosis. No masses, redness, swelling, asymmetry, or associated skin lesions. No contractures.  Skin & Extremity Inspection: Skin color, temperature, and hair growth are WNL. No peripheral edema or cyanosis. No masses, redness, swelling, asymmetry, or associated skin lesions. No contractures.  Functional ROM: Unrestricted ROM          Functional ROM: Unrestricted ROM          Muscle Tone/Strength: Functionally intact. No obvious neuro-muscular anomalies detected.  Muscle Tone/Strength: Functionally intact. No obvious neuro-muscular anomalies detected.  Sensory (Neurological):  Unimpaired          Sensory (Neurological): Unimpaired          Palpation: No palpable anomalies              Palpation: No palpable anomalies              Specialized Test(s): Deferred         Specialized Test(s): Deferred          Thoracic Spine Area Exam  Skin & Axial Inspection: No masses, redness, or swelling Alignment: Symmetrical Functional ROM: Unrestricted ROM Stability: No instability detected Muscle Tone/Strength: Functionally intact. No obvious neuro-muscular anomalies detected. Sensory (Neurological): Unimpaired Muscle strength & Tone: No palpable anomalies  Lumbar Spine Area Exam  Skin & Axial Inspection: Well healed scar from previous spine surgery detected Alignment: Scoliosis detected Functional ROM: Decreased ROM, bilaterally Stability: No instability detected Muscle Tone/Strength: Functionally intact. No obvious neuro-muscular anomalies detected. Sensory (Neurological): Articular pain pattern Palpation: Complains of area being tender to palpation       Provocative Tests: Lumbar Hyperextension and rotation test: Positive bilaterally for facet joint pain. Lumbar Lateral bending test: Positive due to fusion restriction. Patrick's Maneuver: evaluation deferred today                    Gait & Posture Assessment  Ambulation: Unassisted Gait: Relatively normal for age and body habitus Posture: WNL   Lower Extremity Exam    Side: Right lower extremity  Side: Left lower extremity  Skin & Extremity Inspection: Skin color, temperature, and hair growth are WNL. No peripheral edema or cyanosis. No masses, redness, swelling, asymmetry, or associated skin lesions. No contractures.  Skin & Extremity Inspection: Skin color, temperature, and hair growth are WNL. No peripheral edema or cyanosis. No masses, redness, swelling, asymmetry, or associated skin lesions. No contractures.  Functional ROM: Unrestricted ROM          Functional ROM: Unrestricted ROM          Muscle  Tone/Strength: Functionally intact. No obvious neuro-muscular anomalies detected.  Muscle Tone/Strength: Functionally intact. No obvious neuro-muscular anomalies detected.  Sensory (Neurological): Unimpaired  Sensory (Neurological): Unimpaired  Palpation: No palpable anomalies  Palpation: No palpable anomalies   Assessment  Primary Diagnosis & Pertinent Problem List: The primary encounter diagnosis was History of lumbar fusion. Diagnoses of Lumbar degenerative disc  disease, Lumbar spondylosis with myelopathy, and Chronic bilateral low back pain without sciatica were also pertinent to this visit.  Visit Diagnosis (New problems to examiner): 1. History of lumbar fusion   2. Lumbar degenerative disc disease   3. Lumbar spondylosis with myelopathy   4. Chronic bilateral low back pain without sciatica    General Recommendations: The pain condition that the patient suffers from is best treated with a multidisciplinary approach that involves an increase in physical activity to prevent de-conditioning and worsening of the pain cycle, as well as psychological counseling (formal and/or informal) to address the co-morbid psychological affects of pain. Treatment will often involve judicious use of pain medications and interventional procedures to decrease the pain, allowing the patient to participate in the physical activity that will ultimately produce long-lasting pain reductions. The goal of the multidisciplinary approach is to return the patient to a higher level of overall function and to restore their ability to perform activities of daily living.  72 year old female who presents with a chief complaint of axial low back pain that radiates into bilateral buttocks occasionally.  Patient has a history of a posterior fusion from L3-S1 over 10 years ago.  Patient describes a very focal area that is exquisitely painful especially with lumbar extension.  She has been taking ibuprofen and Tylenol for her pain.  Patient does utilize marijuana for pain control and nausea management.  I had an extensive discussion with the patient that she would not be a candidate for opioid therapy if she is utilizing marijuana.  Patient does find marijuana beneficial and states that she prefers that over opioid medications which I think is reasonable.  Treatment plan will consist of non-opioid analgesics.  In regards to interventional therapies, the patient could benefit from a lumbar epidural steroid injection however given her cardiac history and multiple stents, I am reluctant to have her discontinue her Plavix given her history of coronary artery disease.  Patient does have severely limited lumbar extension due to her lumbar spinal fusion  I also had a discussion with the patient about stopping smoking.  This is going to have many downstream positive effects in terms of her cardiovascular health, energy levels, fatigue, overall pain.  Today we discussed starting amitriptyline 25 mg nightly for 2 weeks and increasing to 50 mg nightly thereafter.  I will also prescribe her meloxicam 15 mg daily for 30 days.  I have instructed the patient to discontinue ibuprofen or any other NSAIDs while she is taking the meloxicam.  Patient will follow-up in 4 weeks for medication management.  Pharmacotherapy (current): Medications ordered:  Meds ordered this encounter  Medications  . amitriptyline (ELAVIL) 25 MG tablet    Sig: 25 mg nightly for 2 weeks then 50 mg nightly    Dispense:  60 tablet    Refill:  1  . meloxicam (MOBIC) 15 MG tablet    Sig: Take 1 tablet (15 mg total) by mouth daily.    Dispense:  30 tablet    Refill:  0    Do not place this medication, or any other prescription from our practice, on "Automatic Refill". Patient may have prescription filled one day early if pharmacy is closed on scheduled refill date.   Medications administered during this visit: Sarah Payne. Sarah Payne had no medications administered during this  visit.    Provider-requested follow-up: Return in about 5 weeks (around 09/29/2017) for Medication Management.  Future Appointments  Date Time Provider [place]  09/29/2017  9:45 AM  Gillis Santa, MD [facility] None  10/16/2017 10:30 AM Philemon Kingdom, MD [facility] None    Primary Care Physician: [name], NP Location: [facility] Note by: Gillis Santa, M.D, Date: 08/25/2017; Time: 3:00 PM  There are no Patient Instructions on file for this visit.

## 2017-08-25 NOTE — Progress Notes (Signed)
Safety precautions to be maintained throughout the outpatient stay will include: orient to surroundings, keep bed in low position, maintain call bell within reach at all times, provide assistance with transfer out of bed and ambulation.  

## 2017-09-29 ENCOUNTER — Ambulatory Visit
Payer: Medicare Other | Attending: Student in an Organized Health Care Education/Training Program | Admitting: Student in an Organized Health Care Education/Training Program

## 2017-09-29 ENCOUNTER — Other Ambulatory Visit: Payer: Self-pay

## 2017-09-29 ENCOUNTER — Encounter: Payer: Self-pay | Admitting: Student in an Organized Health Care Education/Training Program

## 2017-09-29 VITALS — BP 155/87 | HR 77 | Temp 97.0°F | Ht 67.0 in | Wt 145.0 lb

## 2017-09-29 DIAGNOSIS — I5022 Chronic systolic (congestive) heart failure: Secondary | ICD-10-CM | POA: Diagnosis not present

## 2017-09-29 DIAGNOSIS — M5106 Intervertebral disc disorders with myelopathy, lumbar region: Secondary | ICD-10-CM | POA: Insufficient documentation

## 2017-09-29 DIAGNOSIS — I2510 Atherosclerotic heart disease of native coronary artery without angina pectoris: Secondary | ICD-10-CM | POA: Diagnosis not present

## 2017-09-29 DIAGNOSIS — Z90710 Acquired absence of both cervix and uterus: Secondary | ICD-10-CM | POA: Diagnosis not present

## 2017-09-29 DIAGNOSIS — G8929 Other chronic pain: Secondary | ICD-10-CM | POA: Insufficient documentation

## 2017-09-29 DIAGNOSIS — M545 Low back pain, unspecified: Secondary | ICD-10-CM

## 2017-09-29 DIAGNOSIS — Z981 Arthrodesis status: Secondary | ICD-10-CM | POA: Diagnosis not present

## 2017-09-29 DIAGNOSIS — Z7982 Long term (current) use of aspirin: Secondary | ICD-10-CM | POA: Diagnosis not present

## 2017-09-29 DIAGNOSIS — Z794 Long term (current) use of insulin: Secondary | ICD-10-CM | POA: Diagnosis not present

## 2017-09-29 DIAGNOSIS — E119 Type 2 diabetes mellitus without complications: Secondary | ICD-10-CM | POA: Diagnosis not present

## 2017-09-29 DIAGNOSIS — F17210 Nicotine dependence, cigarettes, uncomplicated: Secondary | ICD-10-CM | POA: Insufficient documentation

## 2017-09-29 DIAGNOSIS — K219 Gastro-esophageal reflux disease without esophagitis: Secondary | ICD-10-CM | POA: Insufficient documentation

## 2017-09-29 DIAGNOSIS — Z9119 Patient's noncompliance with other medical treatment and regimen: Secondary | ICD-10-CM | POA: Insufficient documentation

## 2017-09-29 DIAGNOSIS — M4716 Other spondylosis with myelopathy, lumbar region: Secondary | ICD-10-CM | POA: Diagnosis not present

## 2017-09-29 DIAGNOSIS — G4733 Obstructive sleep apnea (adult) (pediatric): Secondary | ICD-10-CM | POA: Diagnosis not present

## 2017-09-29 DIAGNOSIS — G4700 Insomnia, unspecified: Secondary | ICD-10-CM | POA: Diagnosis not present

## 2017-09-29 DIAGNOSIS — M5136 Other intervertebral disc degeneration, lumbar region: Secondary | ICD-10-CM | POA: Diagnosis not present

## 2017-09-29 DIAGNOSIS — Z79899 Other long term (current) drug therapy: Secondary | ICD-10-CM | POA: Diagnosis not present

## 2017-09-29 DIAGNOSIS — J449 Chronic obstructive pulmonary disease, unspecified: Secondary | ICD-10-CM | POA: Insufficient documentation

## 2017-09-29 DIAGNOSIS — Z9889 Other specified postprocedural states: Secondary | ICD-10-CM | POA: Insufficient documentation

## 2017-09-29 DIAGNOSIS — E785 Hyperlipidemia, unspecified: Secondary | ICD-10-CM | POA: Diagnosis not present

## 2017-09-29 DIAGNOSIS — F329 Major depressive disorder, single episode, unspecified: Secondary | ICD-10-CM | POA: Diagnosis not present

## 2017-09-29 DIAGNOSIS — I110 Hypertensive heart disease with heart failure: Secondary | ICD-10-CM | POA: Insufficient documentation

## 2017-09-29 DIAGNOSIS — E039 Hypothyroidism, unspecified: Secondary | ICD-10-CM | POA: Insufficient documentation

## 2017-09-29 DIAGNOSIS — Z5181 Encounter for therapeutic drug level monitoring: Secondary | ICD-10-CM | POA: Insufficient documentation

## 2017-09-29 MED ORDER — MELOXICAM 15 MG PO TABS: 15.0000 mg | ORAL_TABLET | Freq: Every day | ORAL | 2 refills | Status: DC

## 2017-09-29 MED ORDER — AMITRIPTYLINE HCL 50 MG PO TABS: 50.0000 mg | ORAL_TABLET | Freq: Every day | ORAL | 2 refills | Status: DC

## 2017-09-29 NOTE — Patient Instructions (Signed)
You have got prescriptiions at the pharmacy to pick up.

## 2017-09-29 NOTE — Progress Notes (Signed)
Safety precautions to be maintained throughout the outpatient stay will include: orient to surroundings, keep bed in low position, maintain call bell within reach at all times, provide assistance with transfer out of bed and ambulation.  

## 2017-09-29 NOTE — Progress Notes (Signed)
Patient's Name: Sarah Payne  MRN: [id number]  Referring Provider: [name], NP  DOB: Dec 18, 1945  PCP: [name], NP  DOS: 09/29/2017  Note by: Gillis Santa, MD  Service setting: Ambulatory outpatient  Specialty: Interventional Pain Management  Location: [facility] (AMB) Pain Management Facility    Patient type: Established   Primary Reason(s) for Visit: Encounter for prescription drug management. (Level of risk: moderate)  CC: Back Pain (low)  HPI  Sarah Payne is a 72 y.o. year old, female patient, who comes today for a medication management evaluation. She has CAD, multiple vessel; Tobacco abuse; Hyperlipidemia; Hypothyroidism; Depression; History of lumbar fusion; Diabetes mellitus type 2 with peripheral artery disease ([place]); Lichen sclerosus et atrophicus; Bronchitis, chronic obstructive ([place]); Chronic systolic congestive heart failure ([place]); Obstructive sleep apnea; Noncompliance with diabetes treatment; COPD, moderate ([place]); Diverticulosis of colon without hemorrhage; Esophageal reflux; Adjustment disorder with mixed anxiety and depressed mood; Benign neoplasm of colon; Family history of colon cancer; Internal hemorrhoids; HTN (hypertension); Emesis; Acute systolic (congestive) heart failure ([place]); Takotsubo cardiomyopathy; Frequent PVCs; Insomnia; Chronic bilateral back pain; Lumbar degenerative disc disease; and Lumbar spondylosis with myelopathy on their problem list. Her primarily concern today is the Back Pain (low)  Pain Assessment: Location: Lower Back Radiating: radiates dwon both legs to thighs in the whole leg Onset: More than a month ago Duration: Chronic pain Quality: Throbbing Severity: 0-No pain/10 (self-reported pain score)  Note: Reported level is compatible with observation.                         When using our objective Pain Scale, levels between 6 and 10/10 are said to belong in an emergency room, as it progressively worsens from a 6/10, described as severely  limiting, requiring emergency care not usually available at an outpatient pain management facility. At a 6/10 level, communication becomes difficult and requires great effort. Assistance to reach the emergency department may be required. Facial flushing and profuse sweating along with potentially dangerous increases in heart rate and blood pressure will be evident. Effect on ADL:   Timing: Intermittent Modifying factors: medication  Sarah Payne was last scheduled for an appointment on 08/25/2017 for medication management. During today's appointment we reviewed Sarah Payne's chronic pain status, as well as her outpatient medication regimen.  The patient  reports that she uses drugs. Drug: Marijuana. Her body mass index is 22.71 kg/m.  Further details on both, my assessment(s), as well as the proposed treatment plan, please see below.  Controlled Substance Pharmacotherapy Assessment REMS (Risk Evaluation and Mitigation Strategy)  Non-opioid management only given active and regular marijuana use.  Laboratory Chemistry  Inflammation Markers (CRP: Acute Phase) (ESR: Chronic Phase) Lab Results  Component Value Date   CRP 2.1 04/11/2013   ESRSEDRATE 58 (H) 04/11/2013                         Rheumatology Markers No results found for: RF, ANA, Therisa Doyne, [facility]              Renal Function Markers Lab Results  Component Value Date   BUN 15 06/02/2016   CREATININE 1.04 06/02/2016   GFRAA >60 01/09/2016   GFRNONAA >60 01/09/2016                 Hepatic Function Markers Lab Results  Component Value Date   AST 15 06/02/2016   ALT 15 06/02/2016   ALBUMIN  4.3 06/02/2016   ALKPHOS 110 06/02/2016   HCVAB NEGATIVE 03/12/2015   AMYLASE 32 04/11/2016   LIPASE 55.0 04/11/2016                 Electrolytes Lab Results  Component Value Date   NA 136 06/02/2016   K 4.2 06/02/2016   CL 96 06/02/2016   CALCIUM 10.2 06/02/2016   MG 1.1 (L) 06/12/2014                         Neuropathy Markers Lab Results  Component Value Date   QHUTMLYY50 354 10/13/2011   HGBA1C 10.8 07/17/2017                 Bone Pathology Markers Lab Results  Component Value Date   VD25OH 41 02/25/2013                         Coagulation Parameters Lab Results  Component Value Date   INR 1.01 01/09/2016   LABPROT 13.5 01/09/2016   APTT 109 (H) 01/01/2016   PLT 289.0 06/02/2016                 Cardiovascular Markers Lab Results  Component Value Date   BNP 91 08/01/2014   CKTOTAL 272 (H) 08/01/2014   CKMB 4.5 (H) 08/01/2014   TROPONINI <0.03 01/09/2016   HGB 14.7 06/02/2016   HCT 43.7 06/02/2016                 CA Markers No results found for: CEA, CA125, LABCA2               Note: Lab results reviewed.  Recent Diagnostic Imaging Results  DG Lumbar Spine Complete CLINICAL DATA:  Low back pain  EXAM: LUMBAR SPINE - COMPLETE 4+ VIEW  COMPARISON:  Abdominal radiograph 04/04/2016  FINDINGS: There is posterior fusion hardware extending from L3-S1 with bilateral transpedicular screws and spinal rods, with a crossbar at the L3-4 level. There is disc spacer material at L3-4, L4-5 and L5-S1. Alignment at diffuse segments is normal. There is no clear evidence of hardware loosening or infection.  There is marked narrowing of the intervertebral disc space at L2-L3 with associated formation of an anterior osteophyte. No acute fracture. The sacroiliac joints and visualized pelvic structures are unremarkable.  IMPRESSION: 1. Posterior spinal fusion from L3-S1 in normal alignment. No hardware abnormality. 2. Adjacent segment disease at L2-L3 with disc space narrowing, osteophyte formation and endplate sclerosis.  Electronically Signed   By: Ulyses Jarred M.D.   On: 05/06/2016 14:18  Complexity Note: Imaging results reviewed. Results shared with Ms. Owens Shark, using Layman's terms.                         Meds   Current Outpatient Medications:  .  aspirin 81  MG tablet, Take 81 mg by mouth daily.  , Disp: , Rfl:  .  Calcium & Magnesium Carbonates (MYLANTA PO), Take 15 mLs by mouth as needed (for indegestion). , Disp: , Rfl:  .  carvedilol (COREG) 3.125 MG tablet, Take 2 tablets (6.25 mg total) by mouth 2 (two) times daily with a meal., Disp: 360 tablet, Rfl: 3 .  clopidogrel (PLAVIX) 75 MG tablet, TAKE 1 TABLET (75 MG TOTAL) BY MOUTH DAILY., Disp: 90 tablet, Rfl: 3 .  cyclobenzaprine (FLEXERIL) 10 MG tablet, Take 0.5-1 tablets (5-10 mg total) by mouth 3 (  three) times daily as needed for muscle spasms., Disp: 30 tablet, Rfl: 0 .  enalapril (VASOTEC) 10 MG tablet, Take 1 tablet (10 mg total) by mouth daily., Disp: 90 tablet, Rfl: 3 .  ezetimibe (ZETIA) 10 MG tablet, Take 1 tablet (10 mg total) by mouth daily., Disp: 90 tablet, Rfl: 3 .  glucose blood (FREESTYLE LITE) test strip, Check blood sugar 5 times daily to check blood sugars - Uncontrolled diabetes mellitus, Disp: 400 each, Rfl: 5 .  glucose monitoring kit (FREESTYLE) monitoring kit, 1 each by Does not apply route as needed for other. PHARMACY PLEASE SUBSTITUTE WHATEVER GLUCOMETER YOU CARRY, Disp: 1 each, Rfl: 1 .  insulin aspart (NOVOLOG FLEXPEN) 100 UNIT/ML FlexPen, Inject 14-22 Units into the skin 3 (three) times daily with meals. Normally takes 12 units, Disp: 15 pen, Rfl: 3 .  Insulin Glargine (LANTUS SOLOSTAR) 100 UNIT/ML Solostar Pen, Inject 40 Units into the skin at bedtime., Disp: 15 pen, Rfl: 3 .  Insulin Pen Needle (INSUPEN PEN NEEDLES) 32G X 4 MM MISC, Use to inject insulin 4 times daily., Disp: 250 each, Rfl: 1 .  isosorbide mononitrate (IMDUR) 30 MG 24 hr tablet, Take 1 tablet (30 mg total) by mouth daily., Disp: 90 tablet, Rfl: 3 .  lansoprazole (PREVACID) 15 MG capsule, Take 15 mg by mouth daily. Reported on 11/06/2015, Disp: , Rfl:  .  levothyroxine (SYNTHROID, LEVOTHROID) 125 MCG tablet, Take 1 tablet (125 mcg total) by mouth daily., Disp: 90 tablet, Rfl: 3 .  meloxicam (MOBIC) 15 MG  tablet, Take 1 tablet (15 mg total) by mouth daily., Disp: 30 tablet, Rfl: 2 .  pantoprazole (PROTONIX) 40 MG tablet, Take 1 tablet (40 mg total) by mouth daily., Disp: 90 tablet, Rfl: 3 .  rosuvastatin (CRESTOR) 40 MG tablet, Take 1 tablet (40 mg total) by mouth daily., Disp: 90 tablet, Rfl: 3 .  sitaGLIPtin (JANUVIA) 100 MG tablet, Take 1 tablet (100 mg total) by mouth daily., Disp: 90 tablet, Rfl: 3 .  traZODone (DESYREL) 100 MG tablet, Take 2 tablets (200 mg total) by mouth at bedtime., Disp: 180 tablet, Rfl: 3 .  amitriptyline (ELAVIL) 50 MG tablet, Take 1 tablet (50 mg total) by mouth at bedtime., Disp: 30 tablet, Rfl: 2  ROS  Constitutional: Denies any fever or chills Gastrointestinal: No reported hemesis, hematochezia, vomiting, or acute GI distress Musculoskeletal: Denies any acute onset joint swelling, redness, loss of ROM, or weakness Neurological: No reported episodes of acute onset apraxia, aphasia, dysarthria, agnosia, amnesia, paralysis, loss of coordination, or loss of consciousness  Allergies  Sarah Payne is allergic to no known allergies.  [place]  Drug: Sarah Payne  reports that she uses drugs. Drug: Marijuana. Alcohol:  reports that she does not drink alcohol. Tobacco:  reports that she has been smoking cigarettes.  She has a 45.00 pack-year smoking history. she has never used smokeless tobacco. Medical:  has a past medical history of Cataract, Chronic low back pain, COPD (chronic obstructive pulmonary disease) ([place]), Coronary artery disease, Depression, Diabetes mellitus, Diverticulosis, Frequent PVCs, GERD (gastroesophageal reflux disease), Hyperlipidemia, Hypertension, Hypothyroidism, Impingement syndrome of left shoulder, Marijuana abuse, Myocardial infarction [facility]), Sleep apnea, Takotsubo cardiomyopathy, Tendonitis of left rotator cuff, Tobacco abuse, and Vascular dementia. Surgical: Sarah Payne  has a past surgical history that includes Back surgery; Appendectomy; Abdominal  hysterectomy; Colonoscopy (N/A, 11/02/2014); Cataract extraction w/PHACO (Left, 01/30/2015); Cardiac catheterization (2013); Coronary angioplasty (2012); Coronary angioplasty with stent (2013); Cardiac catheterization (2016); Cataract extraction w/PHACO (Right, 02/13/2015); and Cardiac  catheterization (N/A, 01/01/2016). Family: family history includes Colon cancer in her sister; Heart attack in her mother; Heart disease in her father and mother; Liver cancer in her brother; Throat cancer in her brother.  Constitutional Exam  General appearance: Well nourished, well developed, and well hydrated. In no apparent acute distress Vitals:   09/29/17 1008 09/29/17 1009  BP: (!) 144/101 (!) 155/87  Pulse: 77   Temp: (!) 97 F (36.1 C)   SpO2: 100%   Weight: 145 lb (65.8 kg)   Height: '5\' 7"'$  (1.702 m)    BMI Assessment: Estimated body mass index is 22.71 kg/m as calculated from the following:   Height as of this encounter: '5\' 7"'$  (1.702 m).   Weight as of this encounter: 145 lb (65.8 kg).  BMI interpretation table: BMI level Category Range association with higher incidence of chronic pain  <18 kg/m2 Underweight   18.5-24.9 kg/m2 Ideal body weight   25-29.9 kg/m2 Overweight Increased incidence by 20%  30-34.9 kg/m2 Obese (Class I) Increased incidence by 68%  35-39.9 kg/m2 Severe obesity (Class II) Increased incidence by 136%  >40 kg/m2 Extreme obesity (Class III) Increased incidence by 254%   BMI Readings from Last 4 Encounters:  09/29/17 22.71 kg/m  08/25/17 22.71 kg/m  07/23/17 23.45 kg/m  07/17/17 23.79 kg/m   Wt Readings from Last 4 Encounters:  09/29/17 145 lb (65.8 kg)  08/25/17 145 lb (65.8 kg)  07/23/17 147 lb 8 oz (66.9 kg)  07/17/17 147 lb 6.4 oz (66.9 kg)  Psych/Mental status: Alert, oriented x 3 (person, place, & time)       Eyes: PERLA Respiratory: No evidence of acute respiratory distress  Cervical Spine Area Exam  Skin & Axial Inspection: No masses, redness, edema,  swelling, or associated skin lesions Alignment: Symmetrical Functional ROM: Unrestricted ROM      Stability: No instability detected Muscle Tone/Strength: Functionally intact. No obvious neuro-muscular anomalies detected. Sensory (Neurological): Unimpaired Palpation: No palpable anomalies              Upper Extremity (UE) Exam    Side: Right upper extremity  Side: Left upper extremity  Skin & Extremity Inspection: Skin color, temperature, and hair growth are WNL. No peripheral edema or cyanosis. No masses, redness, swelling, asymmetry, or associated skin lesions. No contractures.  Skin & Extremity Inspection: Skin color, temperature, and hair growth are WNL. No peripheral edema or cyanosis. No masses, redness, swelling, asymmetry, or associated skin lesions. No contractures.  Functional ROM: Unrestricted ROM          Functional ROM: Unrestricted ROM          Muscle Tone/Strength: Functionally intact. No obvious neuro-muscular anomalies detected.  Muscle Tone/Strength: Functionally intact. No obvious neuro-muscular anomalies detected.  Sensory (Neurological): Unimpaired          Sensory (Neurological): Unimpaired          Palpation: No palpable anomalies              Palpation: No palpable anomalies              Specialized Test(s): Deferred         Specialized Test(s): Deferred          Thoracic Spine Area Exam  Skin & Axial Inspection: No masses, redness, or swelling Alignment: Symmetrical Functional ROM: Unrestricted ROM Stability: No instability detected Muscle Tone/Strength: Functionally intact. No obvious neuro-muscular anomalies detected. Sensory (Neurological): Unimpaired Muscle strength & Tone: No palpable anomalies  Lumbar Spine Area Exam  Skin & Axial Inspection: Well healed scar from previous spine surgery detected Alignment: Symmetrical Functional ROM: Decreased ROM      Stability: No instability detected Muscle Tone/Strength: Functionally intact. No obvious neuro-muscular  anomalies detected. Sensory (Neurological): Unimpaired Palpation: No palpable anomalies       Provocative Tests: Lumbar Hyperextension and rotation test: Positive due to fusion restriction. Lumbar Lateral bending test: Positive due to fusion restriction. Patrick's Maneuver: evaluation deferred today                    Gait & Posture Assessment  Ambulation: Unassisted Gait: Relatively normal for age and body habitus Posture: WNL   Lower Extremity Exam    Side: Right lower extremity  Side: Left lower extremity  Skin & Extremity Inspection: Skin color, temperature, and hair growth are WNL. No peripheral edema or cyanosis. No masses, redness, swelling, asymmetry, or associated skin lesions. No contractures.  Skin & Extremity Inspection: Skin color, temperature, and hair growth are WNL. No peripheral edema or cyanosis. No masses, redness, swelling, asymmetry, or associated skin lesions. No contractures.  Functional ROM: Unrestricted ROM          Functional ROM: Unrestricted ROM          Muscle Tone/Strength: Functionally intact. No obvious neuro-muscular anomalies detected.  Muscle Tone/Strength: Functionally intact. No obvious neuro-muscular anomalies detected.  Sensory (Neurological): Unimpaired  Sensory (Neurological): Unimpaired  Palpation: No palpable anomalies  Palpation: No palpable anomalies   Assessment  Primary Diagnosis & Pertinent Problem List: The primary encounter diagnosis was History of lumbar fusion. Diagnoses of Lumbar degenerative disc disease, Lumbar spondylosis with myelopathy, and Chronic bilateral low back pain without sciatica were also pertinent to this visit.  Status Diagnosis  Controlled Controlled Controlled 1. History of lumbar fusion   2. Lumbar degenerative disc disease   3. Lumbar spondylosis with myelopathy   4. Chronic bilateral low back pain without sciatica     Problems updated and reviewed during this visit: Problem  Lumbar Degenerative Disc Disease   Lumbar Spondylosis With Myelopathy  History of Lumbar Fusion   General Recommendations: The pain condition that the patient suffers from is best treated with a multidisciplinary approach that involves an increase in physical activity to prevent de-conditioning and worsening of the pain cycle, as well as psychological counseling (formal and/or informal) to address the co-morbid psychological affects of pain. Treatment will often involve judicious use of pain medications and interventional procedures to decrease the pain, allowing the patient to participate in the physical activity that will ultimately produce long-lasting pain reductions. The goal of the multidisciplinary approach is to return the patient to a higher level of overall function and to restore their ability to perform activities of daily living.  72 year old female who presents with a chief complaint of axial low back pain that radiates into bilateral buttocks occasionally.  Patient has a history of a posterior fusion from L3-S1 over 10 years ago.  Patient describes a very focal area that is exquisitely painful especially with lumbar extension.  She has been taking ibuprofen and Tylenol for her pain. Patient does utilize marijuana for pain control and nausea management.  I had an extensive discussion with the patient that she would not be a candidate for opioid therapy if she is utilizing marijuana.  Patient does find marijuana beneficial and states that she prefers that over opioid medications which I think is reasonable.  Treatment plan will consist of non-opioid analgesics.  In regards to interventional therapies, the  patient could benefit from a lumbar epidural steroid injection however given her cardiac history and multiple stents, I am reluctant to have her discontinue her Plavix given her history of coronary artery disease.  Patient does have severely limited lumbar extension due to her lumbar spinal fusion.  Patient returns today for  follow-up and medication refill.  She finds significant benefit after starting meloxicam 15 mg daily along with amitriptyline 50 mill grams nightly.  She states that she no longer has any significant pain and is rating her pain score today is 0.  She is able to perform activities of daily living with greater ease and has improved functional status.   Plan of Care  Pharmacotherapy (Medications Ordered): Meds ordered this encounter  Medications  . meloxicam (MOBIC) 15 MG tablet    Sig: Take 1 tablet (15 mg total) by mouth daily.    Dispense:  30 tablet    Refill:  2  . amitriptyline (ELAVIL) 50 MG tablet    Sig: Take 1 tablet (50 mg total) by mouth at bedtime.    Dispense:  30 tablet    Refill:  2   Future considerations: Gabapentin, Lyrica, tizanidine, Cymbalta  Provider-requested follow-up: Return in about 3 months (around 12/30/2017) for Medication Management. Time Note: Greater than 50% of the 25 minute(s) of face-to-face time spent with Sarah Payne, was spent in counseling/coordination of care regarding: Sarah Payne primary cause of pain, the treatment plan, medication side effects, realistic expectations and the goals of pain management (increased in functionality). Future Appointments  Date Time Provider [place]  10/16/2017 10:30 AM Philemon Kingdom, MD [id number] None  12/29/2017  9:45 AM Gillis Santa, MD [facility] None    Primary Care Physician: [name], NP Location: [facility] Note by: Gillis Santa, M.D Date: 09/29/2017; Time: 10:30 AM  Patient Instructions  You have got prescriptiions at the pharmacy to pick up.

## 2017-10-16 ENCOUNTER — Encounter: Payer: Self-pay | Admitting: Internal Medicine

## 2017-10-16 ENCOUNTER — Ambulatory Visit (INDEPENDENT_AMBULATORY_CARE_PROVIDER_SITE_OTHER): Payer: Medicare Other | Admitting: Internal Medicine

## 2017-10-16 VITALS — BP 186/102 | HR 95 | Ht 67.0 in | Wt 147.0 lb

## 2017-10-16 DIAGNOSIS — I2510 Atherosclerotic heart disease of native coronary artery without angina pectoris: Secondary | ICD-10-CM

## 2017-10-16 DIAGNOSIS — E1151 Type 2 diabetes mellitus with diabetic peripheral angiopathy without gangrene: Secondary | ICD-10-CM

## 2017-10-16 LAB — POCT GLYCOSYLATED HEMOGLOBIN (HGB A1C): Hemoglobin A1C: 10.2

## 2017-10-16 NOTE — Patient Instructions (Addendum)
Please continue: - Lantus 40 units at bedtime - Januvia 100 mg in am, before b'fast  Please increase: - NovoLog 18 units before a smaller meal   22 units before a larger meal or if you have dessert  If sugars before the meal are: 70 or lower, do not take insulin before that meal 71- 99, take no more than 8 units with that meal 100 or above, take the whole dose of insulin  Do not skip insulin doses. Take Novolog with you at lunch.  Please return in 3 months with your sugar log.

## 2017-10-16 NOTE — Progress Notes (Addendum)
Patient ID: Sarah Payne, female   DOB: June 17, 1946, 72 y.o.   MRN: [id number]  HPI: Sarah Payne is a 72 y.o.-year-old female, returning for f/u for DM2, dx 2003, insulin-dependent since 2013, uncontrolled, with complications (CAD, sCHF, PAD, CKD, PN) and also complicated by dementia and medication noncompliance, also hypothyroidism, uncontrolled. Last visit 3 mo ago.  DM2: Last hemoglobin A1c: Lab Results  Component Value Date   HGBA1C 10.8 07/17/2017   HGBA1C 11.0 04/17/2017   HGBA1C 10.4 (H) 09/16/2016   Pt is on a regimen of: - Lantus 40 units at bedtime - Januvia 100 mg in am, before b'fast - NovoLog (skips lunch insulin usually, she is still not taking the higher dose is recommended) (14 units before a smaller meal)    If sugars before the meal are: 70 or lower, do not take insulin before that meal 71- 99, take no more than 8 units with that meal 100 or above, take the whole dose of insulin  Pt checks her sugars 1-2x a day: - am: 174-341 >> 101-190, 235, 288 (forgot Lantus) - 2h after b'fast: 245-400 >> n/c >> 98 >> 190, 210 - before lunch: 124, 266, 368 >> 422 >> 200 - 2h after lunch: n/c >> 117-361 >> 200-230 - before dinner: 179-230 >> 210, 269 >> 78 x1 (not eating, sick) - 2h after dinner: 55-285 >> n/c >> 189 >> 270-289  - bedtime:  69, 150-242 >> n/c >> 263 >> n/c - nighttime: 54, 58, 145 >> 270 >> n/c Lowest sugar was 98 >> 78. Highest sugar was 422 >> 288.  -+ Mild CKD, last BUN/creatinine:  Lab Results  Component Value Date   BUN 15 06/02/2016   CREATININE 1.04 06/02/2016  On enalapril. -+ HL; last set of lipids: Lab Results  Component Value Date   CHOL 301 (H) 06/02/2016   HDL 45.10 06/02/2016   LDLCALC 146 (H) 03/12/2015   LDLDIRECT 207.0 06/02/2016   TRIG 387.0 (H) 06/02/2016   CHOLHDL 7 06/02/2016  She is on Crestor and Zetia. She is on ASA 81. - last eye exam was in 2016: No DR.  She had cataract surgery. -+ Numbness and tingling in her  feet.  Hypothyroidism: -Previous history of  medication noncompliance  Pt is on levothyroxine 125 mcg daily, taken: - in am - fasting - at least 30 min from b'fast - no Ca, Fe, MVI, PPIs - not on Biotin  Latest TSH was normal: Lab Results  Component Value Date   TSH 2.38 04/17/2017   TSH 0.07 (L) 09/18/2016   TSH 0.36 04/17/2016   TSH 0.24 (L) 02/06/2016   TSH 51.35 (H) 09/20/2015   TSH 0.52 06/18/2015   TSH 53.23 (H) 06/01/2015   TSH 40.79 (H) 03/12/2015   TSH 53.83 (H) 12/21/2014   TSH 60.34 (H) 08/16/2014   FREET4 0.78 04/17/2017   FREET4 0.97 09/18/2016   FREET4 1.21 04/17/2016   FREET4 2.16 (H) 02/06/2016   FREET4 0.27 (L) 09/20/2015   FREET4 0.57 (L) 03/12/2015   FREET4 0.21 (L) 12/21/2014   FREET4 0.30 (L) 08/16/2014   FREET4 1.03 04/26/2014   FREET4 1.58 11/22/2013   Pt denies: - feeling nodules in neck - hoarseness - dysphagia - choking - SOB with lying down  Her sister died, her nephew killed himself and she had an AMI in summer 2017.  ROS: Constitutional: no weight gain/no weight loss, no fatigue, no subjective hyperthermia, no subjective hypothermia Eyes: no blurry vision, no xerophthalmia  ENT: no sore throat, + see HPI Cardiovascular: no CP/no SOB/no palpitations/no leg swelling Respiratory: no cough/no SOB/no wheezing Gastrointestinal: no N/no V/no D/no C/no acid reflux Musculoskeletal: no muscle aches/+ joint aches Skin: no rashes, no hair loss Neurological: no tremors/+ numbness/+ tingling/no dizziness  I reviewed pt's medications, allergies, PMH, social hx, family hx, and changes were documented in the history of present illness. Otherwise, unchanged from my initial visit note.  PE: BP (!) 186/102   Pulse 95   Ht 5\' 7"  (1.702 m)   Wt 147 lb (66.7 kg)   SpO2 97%   BMI 23.02 kg/m  Body mass index is 23.02 kg/m. Wt Readings from Last 3 Encounters:  10/16/17 147 lb (66.7 kg)  09/29/17 145 lb (65.8 kg)  08/25/17 145 lb (65.8 kg)    Constitutional: normal weight, in NAD Eyes: PERRLA, EOMI, no exophthalmos ENT: moist mucous membranes, no thyromegaly, no cervical lymphadenopathy Cardiovascular: no tachycardia at the time of exam, RR, No MRG Respiratory: CTA B Gastrointestinal: abdomen soft, NT, ND, BS+ Musculoskeletal: no deformities, strength intact in all 4 Skin: moist, warm, no rashes Neurological: no tremor with outstretched hands, DTR normal in all 4  ASSESSMENT: 1. DM2, insulin-dependent, uncontrolled, with complications - med noncompliance (dementia) - CAD - had 3 AMI in 2011 >> 7 stents placed; new AMI 2016-02-25 after her sister's death - sCHF - PAD - CKD - PN  2. Hypothyroidism - Uncontrolled  PLAN:  1. Patient with long-standing, uncontrolled, diabetes, with history of medication and visit noncompliance.  At last visit, she only had few sugars checked and she was not following instructions about increasing her NovoLog and taking it before meals.  She was taking the lowest dose recommended only and was taking it after meals.  We again discussed about how to take NovoLog correctly but otherwise we did not change her regimen at last visit. - At this visit, sugars are better, especially in the morning, but the increase throughout the day.   - She is still not taking the recommended higher doses of NovoLog.  Also, she is asking me today whether she needs to take this before after a meal.  We discussed multiple times and I wrote it down for her that this needs to be taken before meals.  I emphasized this again today, and advised her to take it 10-15 minutes before a meal.  I again advised her to increase the NovoLog doses.  She is also missing NovoLog doses at lunch during the weekdays and whenever she eats out so sugars are higher In the second half of the day.  I advised her to take the NovoLog pen with her when she eats out. -We will not change the Lantus dose since this appears appropriate. - I suggested to:   Patient Instructions  Please continue: - Lantus 40 units at bedtime - Januvia 100 mg in am, before b'fast  Please increase: - NovoLog 18 units before a smaller meal   22 units before a larger meal or if you have dessert  If sugars before the meal are: 70 or lower, do not take insulin before that meal 71- 99, take no more than 8 units with that meal 100 or above, take the whole dose of insulin  Do not skip insulin doses. Take Novolog with you at lunch.  Please return in 3 months with your sugar log.    - today, HbA1c is 10.2% (slightly he lower, but still high) - continue checking sugars at different  times of the day - check 3-4x a day, rotating checks - advised for yearly eye exams >> she is UTD - Return to clinic in 3 mo with sugar log    2. Hypothyroidism - Patient's TFTs have been very uncontrolled, due to poor medication compliance and taking her levothyroxine along with calcium and PPIs in the past.  She is now off these interfering substances and not missing doses. - latest thyroid labs reviewed with pt >> normal 03/2017 - she continues on LT4 125 mcg daily - pt feels good on this dose. - we discussed about taking the thyroid hormone every day, with water, >30 minutes before breakfast, separated by >4 hours from acid reflux medications, calcium, iron, multivitamins. Pt. is taking it correctly.  Addendum: Blood pressure checked at the end of the appointment was 154/96, improved.  Philemon Kingdom, MD PhD [facility]

## 2017-10-19 ENCOUNTER — Encounter: Payer: Self-pay | Admitting: Gastroenterology

## 2017-11-11 ENCOUNTER — Other Ambulatory Visit: Payer: Self-pay | Admitting: Internal Medicine

## 2017-11-11 DIAGNOSIS — E1151 Type 2 diabetes mellitus with diabetic peripheral angiopathy without gangrene: Secondary | ICD-10-CM

## 2017-11-13 ENCOUNTER — Other Ambulatory Visit: Payer: Self-pay | Admitting: Primary Care

## 2017-11-13 DIAGNOSIS — E039 Hypothyroidism, unspecified: Secondary | ICD-10-CM

## 2017-11-13 DIAGNOSIS — I10 Essential (primary) hypertension: Secondary | ICD-10-CM

## 2017-11-13 DIAGNOSIS — E785 Hyperlipidemia, unspecified: Secondary | ICD-10-CM

## 2017-11-17 ENCOUNTER — Ambulatory Visit (INDEPENDENT_AMBULATORY_CARE_PROVIDER_SITE_OTHER): Payer: Medicare Other

## 2017-11-17 VITALS — BP 152/98 | HR 68 | Temp 98.0°F | Ht 65.0 in | Wt 147.5 lb

## 2017-11-17 DIAGNOSIS — E785 Hyperlipidemia, unspecified: Secondary | ICD-10-CM

## 2017-11-17 DIAGNOSIS — E039 Hypothyroidism, unspecified: Secondary | ICD-10-CM

## 2017-11-17 DIAGNOSIS — Z0000 Encounter for general adult medical examination without abnormal findings: Secondary | ICD-10-CM

## 2017-11-17 DIAGNOSIS — R300 Dysuria: Secondary | ICD-10-CM | POA: Diagnosis not present

## 2017-11-17 DIAGNOSIS — F5101 Primary insomnia: Secondary | ICD-10-CM

## 2017-11-17 DIAGNOSIS — I10 Essential (primary) hypertension: Secondary | ICD-10-CM | POA: Diagnosis not present

## 2017-11-17 DIAGNOSIS — R350 Frequency of micturition: Secondary | ICD-10-CM

## 2017-11-17 LAB — POC URINALSYSI DIPSTICK (AUTOMATED)
Bilirubin, UA: NEGATIVE
Blood, UA: NEGATIVE
Glucose, UA: NEGATIVE
Ketones, UA: NEGATIVE
Leukocytes, UA: NEGATIVE
Nitrite, UA: NEGATIVE
Protein, UA: NEGATIVE
Spec Grav, UA: >=1.03 — AB (ref 1.010–1.025)
Urobilinogen, UA: 0.2 E.U./dL
pH, UA: 6 (ref 5.0–8.0)

## 2017-11-17 LAB — LDL CHOLESTEROL, DIRECT: Direct LDL: 152 mg/dL

## 2017-11-17 LAB — COMPREHENSIVE METABOLIC PANEL
ALT: 12 U/L (ref 0–35)
AST: 14 U/L (ref 0–37)
Albumin: 3.9 g/dL (ref 3.5–5.2)
Alkaline Phosphatase: 76 U/L (ref 39–117)
BUN: 12 mg/dL (ref 6–23)
CO2: 32 mEq/L (ref 19–32)
Calcium: 9.3 mg/dL (ref 8.4–10.5)
Chloride: 102 mEq/L (ref 96–112)
Creatinine, Ser: 0.99 mg/dL (ref 0.40–1.20)
GFR: 58.99 mL/min — ABNORMAL LOW (ref >60.00)
Glucose, Bld: 125 mg/dL — ABNORMAL HIGH (ref 70–99)
Potassium: 3.6 mEq/L (ref 3.5–5.1)
Sodium: 141 mEq/L (ref 135–145)
Total Bilirubin: 0.3 mg/dL (ref 0.2–1.2)
Total Protein: 6.6 g/dL (ref 6.0–8.3)

## 2017-11-17 LAB — LIPID PANEL
Cholesterol: 218 mg/dL — ABNORMAL HIGH (ref 0–200)
HDL: 43 mg/dL (ref >39.00)
NonHDL: 175.16
Total CHOL/HDL Ratio: 5
Triglycerides: 201 mg/dL — ABNORMAL HIGH (ref 0.0–149.0)
VLDL: 40.2 mg/dL — ABNORMAL HIGH (ref 0.0–40.0)

## 2017-11-17 LAB — TSH: TSH: 10.5 u[IU]/mL — ABNORMAL HIGH (ref 0.35–4.50)

## 2017-11-17 NOTE — Progress Notes (Signed)
Patient has requested refill of Trazodone 100 mg tablets. [place] is pharmacy.

## 2017-11-17 NOTE — Progress Notes (Signed)
Subjective:   Sarah Payne is a 72 y.o. female who presents for Medicare Annual (Subsequent) preventive examination.  Review of Systems:  N/A Cardiac Risk Factors include: advanced age (>22mn, >>58women);diabetes mellitus;dyslipidemia;hypertension;smoking/ tobacco exposure     Objective:     Vitals: BP (!) 152/98 (BP Location: Right Arm, Patient Position: Sitting, Cuff Size: Normal)   Pulse 68   Temp 98 F (36.7 C) (Oral)   Ht 5' 5"  (1.651 m) Comment: no shoes  Wt 147 lb 8 oz (66.9 kg)   SpO2 96%   BMI 24.55 kg/m   Body mass index is 24.55 kg/m.  Advanced Directives 11/17/2017 09/29/2017 04/21/2016 01/01/2016 01/01/2016 02/13/2015 01/30/2015  Does Patient Have a Medical Advance Directive? No No No No No No No  Would patient like information on creating a medical advance directive? Yes (MAU/Ambulatory/Procedural Areas - Information given) - No - patient declined information No - patient declined information Yes - Educational materials given No - patient declined information No - patient declined information    Tobacco Social History   Tobacco Use  Smoking Status Current Every Day Smoker  . Packs/day: 1.00  . Years: 45.00  . Pack years: 45.00  . Types: Cigarettes  Smokeless Tobacco Never Used  Tobacco Comment   Has cut back, trying to quit.      Ready to quit: Yes Counseling given: No Comment: Has cut back, trying to quit.    Clinical Intake:  Pre-visit preparation completed: Yes  Pain : No/denies pain Pain Score: 4      Nutritional Status: BMI of 19-24  Normal Nutritional Risks: None Diabetes: Yes CBG done?: No Did pt. bring in CBG monitor from home?: No  How often do you need to have someone help you when you read instructions, pamphlets, or other written materials from your doctor or pharmacy?: 1 - Never What is the last grade level you completed in school?: 12th grade + cosmoetology school  Interpreter Needed?: No  Comments: pt lives with  spouse Information entered by :: LPinson, LPN  Past Medical History:  Diagnosis Date  . Cataract    BILATERAL REMOVED  . Chronic low back pain   . COPD (chronic obstructive pulmonary disease) ([place]   . Coronary artery disease    a. multiple PCIs to the mLCx. b. stent to dRCA 08/2011 in setting of NSTEMI. c. DES to prox-mid LCx for ISR 09/2011. d.  DESx2 to prox-mRCA 07/2012. e. Takotsubo event 12/2015 with patent stents.  . Depression   . Diabetes mellitus   . Diverticulosis   . Frequent PVCs    a. Noted in hospital 12/2015.  .Marland KitchenGERD (gastroesophageal reflux disease)   . Hyperlipidemia   . Hypertension   . Hypothyroidism   . Impingement syndrome of left shoulder   . Marijuana abuse   . Myocardial infarction ([place]    x 5  . Sleep apnea    mild-does not use cpap  . Takotsubo cardiomyopathy    a. 12/2015 - nephew committed suicide 1 week prior, sister died the morning of presentation - initially called a STEMI; cath with patent stents. LVEF 25-30%.  . Tendonitis of left rotator cuff   . Tobacco abuse   . Vascular dementia    Past Surgical History:  Procedure Laterality Date  . ABDOMINAL HYSTERECTOMY    . APPENDECTOMY    . BACK SURGERY    . CARDIAC CATHETERIZATION  2013  . CARDIAC CATHETERIZATION  2016  . CARDIAC CATHETERIZATION N/A 01/01/2016  Procedure: Left Heart Cath and Coronary Angiography;  Surgeon: Jettie Booze, MD;  Location: [place] [facility];  Service: Cardiovascular;  Laterality: N/A;  . CATARACT EXTRACTION W/PHACO Left 01/30/2015   Procedure: CATARACT EXTRACTION PHACO AND INTRAOCULAR LENS PLACEMENT (IOC);  Surgeon: Birder Robson, MD;  Location: [facility];  Service: Ophthalmology;  Laterality: Left;  [place] 00:47   . CATARACT EXTRACTION W/PHACO Right 02/13/2015   Procedure: CATARACT EXTRACTION PHACO AND INTRAOCULAR LENS PLACEMENT (IOC);  Surgeon: Birder Robson, MD;  Location: [facility];  Service: Ophthalmology;  Laterality: Right;  cassette lot # [id number] H [place]   00:29.9 AP  20.7 CDE  6.20  . COLONOSCOPY N/A 11/02/2014   Procedure: COLONOSCOPY;  Surgeon: Inda Castle, MD;  Location: [place];  Service: Endoscopy;  Laterality: N/A;  . CORONARY ANGIOPLASTY  2012   stent x 3   . CORONARY ANGIOPLASTY WITH STENT PLACEMENT  2013   Family History  Problem Relation Age of Onset  . Heart attack Mother        First MI @ 14 - Died @ 67  . Heart disease Mother   . Heart disease Father        Died @ 68  . Throat cancer Brother   . Liver cancer Brother   . Colon cancer Sister    Social History   Socioeconomic History  . Marital status: Married    Spouse name: Not on file  . Number of children: Not on file  . Years of education: Not on file  . Highest education level: Not on file  Occupational History  . Not on file  Social Needs  . Financial resource strain: Not on file  . Food insecurity:    Worry: Not on file    Inability: Not on file  . Transportation needs:    Medical: Not on file    Non-medical: Not on file  Tobacco Use  . Smoking status: Current Every Day Smoker    Packs/day: 1.00    Years: 45.00    Pack years: 45.00    Types: Cigarettes  . Smokeless tobacco: Never Used  . Tobacco comment: Has cut back, trying to quit.   Substance and Sexual Activity  . Alcohol use: No    Alcohol/week: 0.0 oz  . Drug use: Yes    Types: Marijuana    Comment: last night 4.13.16  . Sexual activity: Not on file  Lifestyle  . Physical activity:    Days per week: Not on file    Minutes per session: Not on file  . Stress: Not on file  Relationships  . Social connections:    Talks on phone: Not on file    Gets together: Not on file    Attends religious service: Not on file    Active member of club or organization: Not on file    Attends meetings of clubs or organizations: Not on file    Relationship status: Not on file  Other Topics Concern  . Not on file  Social History Narrative   Lives at home with her husband in [place].   Previously used marijuana - quit.      Regular exercise: no/ pain from a frozen rotator cuff   Caffeine use: coffee daily and pepsi      Does not have a living will.   Daughters and husband know her wishes- would desire CPR but not prolonged life support if futile    Outpatient Encounter Medications as of 11/17/2017  Medication  Sig  . amitriptyline (ELAVIL) 50 MG tablet Take 1 tablet (50 mg total) by mouth at bedtime.  Marland Kitchen aspirin 81 MG tablet Take 81 mg by mouth daily.    . Calcium & Magnesium Carbonates (MYLANTA PO) Take 15 mLs by mouth as needed (for indegestion).   . carvedilol (COREG) 3.125 MG tablet Take 2 tablets (6.25 mg total) by mouth 2 (two) times daily with a meal.  . clopidogrel (PLAVIX) 75 MG tablet TAKE 1 TABLET (75 MG TOTAL) BY MOUTH DAILY.  . cyclobenzaprine (FLEXERIL) 10 MG tablet Take 0.5-1 tablets (5-10 mg total) by mouth 3 (three) times daily as needed for muscle spasms.  . enalapril (VASOTEC) 10 MG tablet Take 1 tablet (10 mg total) by mouth daily.  Marland Kitchen ezetimibe (ZETIA) 10 MG tablet Take 1 tablet (10 mg total) by mouth daily.  Marland Kitchen glucose blood (FREESTYLE LITE) test strip USE 1 STRIP TO CHECK BLOOD SUGAR 5 TIMES DAILY FOR UNCONTROLLED DIABETES MELLITUS  . glucose monitoring kit (FREESTYLE) monitoring kit 1 each by Does not apply route as needed for other. PHARMACY PLEASE SUBSTITUTE WHATEVER GLUCOMETER YOU CARRY  . insulin aspart (NOVOLOG FLEXPEN) 100 UNIT/ML FlexPen Inject 14-22 Units into the skin 3 (three) times daily with meals. Normally takes 12 units  . Insulin Glargine (LANTUS SOLOSTAR) 100 UNIT/ML Solostar Pen Inject 40 Units into the skin at bedtime.  . Insulin Pen Needle (INSUPEN PEN NEEDLES) 32G X 4 MM MISC Use to inject insulin 4 times daily.  . isosorbide mononitrate (IMDUR) 30 MG 24 hr tablet Take 1 tablet (30 mg total) by mouth daily.  . lansoprazole (PREVACID) 15 MG capsule Take 15 mg by mouth daily. Reported on 11/06/2015  . levothyroxine (SYNTHROID,  LEVOTHROID) 125 MCG tablet Take 1 tablet (125 mcg total) by mouth daily.  . meloxicam (MOBIC) 15 MG tablet Take 1 tablet (15 mg total) by mouth daily.  . pantoprazole (PROTONIX) 40 MG tablet Take 1 tablet (40 mg total) by mouth daily.  . rosuvastatin (CRESTOR) 40 MG tablet Take 1 tablet (40 mg total) by mouth daily.  . sitaGLIPtin (JANUVIA) 100 MG tablet Take 1 tablet (100 mg total) by mouth daily.  . traZODone (DESYREL) 100 MG tablet Take 2 tablets (200 mg total) by mouth at bedtime.   No facility-administered encounter medications on file as of 11/17/2017.     Activities of Daily Living In your present state of health, do you have any difficulty performing the following activities: 11/17/2017  Hearing? N  Vision? N  Difficulty concentrating or making decisions? Y  Walking or climbing stairs? Y  Dressing or bathing? N  Doing errands, shopping? N  Preparing Food and eating ? N  Using the Toilet? N  In the past six months, have you accidently leaked urine? N  Do you have problems with loss of bowel control? N  Managing your Medications? N  Managing your Finances? N  Housekeeping or managing your Housekeeping? N  Some recent data might be hidden    Patient Care Team: [name], NP as PCP - General (Internal Medicine) Rockey Situ, Kathlene November, MD (Cardiology) Inda Castle, MD (Inactive) as Consulting Physician (Gastroenterology) Meade Maw, MD as Consulting Physician (Neurosurgery) Philemon Kingdom, MD as Consulting Physician (Internal Medicine) Minna Merritts, MD as Consulting Physician (Cardiology)    Assessment:   This is a routine wellness examination for [place].  Exercise Activities and Dietary recommendations Current Exercise Habits: The patient does not participate in regular exercise at present, Exercise limited  by: None identified  Goals    . Patient Stated     Starting 11/17/2017, I will continue to take medications as prescribed.        Fall  Risk Fall Risk  11/17/2017 09/29/2017 08/25/2017 06/27/2016 06/02/2016  Falls in the past year? No No No No No  Comment - - - Emmi Telephone Survey: data to providers prior to load -   Depression Screen PHQ 2/9 Scores 11/17/2017 09/29/2017 08/25/2017 06/02/2016  PHQ - 2 Score 5 0 0 0  PHQ- 9 Score 16 - - -    .Cognitive Function MMSE - Mini Mental State Exam 11/17/2017  Orientation to time 5  Orientation to Place 5  Registration 3  Attention/ Calculation 0  Recall 3  Language- name 2 objects 0  Language- repeat 1  Language- follow 3 step command 3  Language- read & follow direction 0  Write a sentence 0  Copy design 0  Total score 20     PLEASE NOTE: A Mini-Cog screen was completed. Maximum score is 20. A value of 0 denotes this part of [place] MMSE was not completed or the patient failed this part of the Mini-Cog screening.   Mini-Cog Screening Orientation to Time - Max 5 pts Orientation to Place - Max 5 pts Registration - Max 3 pts Recall - Max 3 pts Language Repeat - Max 1 pts Language Follow 3 Step Command - Max 3 pts     Immunization History  Administered Date(s) Administered  . Influenza Split 05/20/2012  . Influenza, High Dose Seasonal PF 04/17/2016, 04/17/2017  . Influenza, Seasonal, Injecte, Preservative Fre 05/07/2006  . Influenza,inj,Quad PF,6+ Mos 04/20/2013, 04/26/2014, 06/01/2015  . Pneumococcal Conjugate-13 04/26/2014  . Pneumococcal Polysaccharide-23 03/28/2013  . Tdap 03/28/2013   Screening Tests Health Maintenance  Topic Date Due  . FOOT EXAM  11/24/2017 (Originally 06/02/2017)  . MAMMOGRAM  03/09/2018 (Originally 03/01/1998)  . OPHTHALMOLOGY EXAM  11/18/2018 (Originally 01/01/2016)  . INFLUENZA VACCINE  02/18/2018  . HEMOGLOBIN A1C  04/18/2018  . TETANUS/TDAP  03/29/2023  . COLONOSCOPY  11/01/2024  . DEXA SCAN  Completed  . Hepatitis C Screening  Completed  . PNA vac Low Risk Adult  Completed      Plan:     I have personally reviewed, addressed,  and noted the following in the patient's chart:  A. Medical and social history B. Use of alcohol, tobacco or illicit drugs  C. Current medications and supplements D. Functional ability and status E.  Nutritional status F.  Physical activity G. Advance directives H. List of other physicians I.  Hospitalizations, surgeries, and ER visits in previous 12 months J.  [place] to include hearing, vision, cognitive, depression L. Referrals and appointments - none  In addition, I have reviewed and discussed with patient certain preventive protocols, quality metrics, and best practice recommendations. A written personalized care plan for preventive services as well as general preventive health recommendations were provided to patient.  See attached scanned questionnaire for additional information.   Signed,   Lindell Noe, MHA, BS, LPN Health Coach

## 2017-11-17 NOTE — Progress Notes (Signed)
PCP notes:   Health maintenance:  Foot exam - PCP please address at next appt  Abnormal screenings:   Depression score: 16 Depression screen [facility] 2/9 11/17/2017 09/29/2017 08/25/2017 06/02/2016 03/12/2015  Decreased Interest 3 0 0 0 0  Down, Depressed, Hopeless 2 0 0 0 1  PHQ - 2 Score 5 0 0 0 1  Altered sleeping 2 - - - -  Tired, decreased energy 3 - - - -  Change in appetite 2 - - - -  Feeling bad or failure about yourself  2 - - - -  Trouble concentrating 0 - - - -  Moving slowly or fidgety/restless 1 - - - -  Suicidal thoughts 1 - - - -  PHQ-9 Score 16 - - - -  Difficult doing work/chores Very difficult - - - -  Some recent data might be hidden    Patient concerns:   Urinary frequency with burning - UA and C&S ordered by PCP  Gastric discomfort - per pt, she feels a "sick stomach"   Refill request for Trazodone - PCP notified.   Itching inside both ears  Nurse concerns:  None  Next PCP appt:   11/24/17 @ 1400

## 2017-11-17 NOTE — Patient Instructions (Signed)
Sarah Payne , Thank you for taking time to come for your Medicare Wellness Visit. I appreciate your ongoing commitment to your health goals. Please review the following plan we discussed and let me know if I can assist you in the future.   These are the goals we discussed: Goals    . Patient Stated     Starting 11/17/2017, I will continue to take medications as prescribed.        This is a list of the screening recommended for you and due dates:  Health Maintenance  Topic Date Due  . Complete foot exam   11/24/2017*  . Mammogram  03/09/2018*  . Eye exam for diabetics  11/18/2018*  . Flu Shot  02/18/2018  . Hemoglobin A1C  04/18/2018  . Tetanus Vaccine  03/29/2023  . Colon Cancer Screening  11/01/2024  . DEXA scan (bone density measurement)  Completed  .  Hepatitis C: One time screening is recommended by Center for Disease Control  (CDC) for  adults born from [birth year] through 1963.   Completed  . Pneumonia vaccines  Completed  *Topic was postponed. The date shown is not the original due date.   Preventive Care for Adults  A healthy lifestyle and preventive care can promote health and wellness. Preventive health guidelines for adults include the following key practices.  . A routine yearly physical is a good way to check with your health care provider about your health and preventive screening. It is a chance to share any concerns and updates on your health and to receive a thorough exam.  . Visit your dentist for a routine exam and preventive care every 6 months. Brush your teeth twice a day and floss once a day. Good oral hygiene prevents tooth decay and gum disease.  . The frequency of eye exams is based on your age, health, family medical history, use  of contact lenses, and other factors. Follow your health care provider's recommendations for frequency of eye exams.  . Eat a healthy diet. Foods like vegetables, fruits, whole grains, low-fat dairy products, and lean protein foods  contain the nutrients you need without too many calories. Decrease your intake of foods high in solid fats, added sugars, and salt. Eat the right amount of calories for you. Get information about a proper diet from your health care provider, if necessary.  . Regular physical exercise is one of the most important things you can do for your health. Most adults should get at least 150 minutes of moderate-intensity exercise (any activity that increases your heart rate and causes you to sweat) each week. In addition, most adults need muscle-strengthening exercises on 2 or more days a week.  Silver Sneakers may be a benefit available to you. To determine eligibility, you may visit the website: www.silversneakers.com or contact program at (928) 863-0774 Mon-Fri between 8AM-8PM.   . Maintain a healthy weight. The body mass index (BMI) is a screening tool to identify possible weight problems. It provides an estimate of body fat based on height and weight. Your health care provider can find your BMI and can help you achieve or maintain a healthy weight.   For adults 20 years and older: ? A BMI below 18.5 is considered underweight. ? A BMI of 18.5 to 24.9 is normal. ? A BMI of 25 to 29.9 is considered overweight. ? A BMI of 30 and above is considered obese.   . Maintain normal blood lipids and cholesterol levels by exercising and minimizing your intake  of saturated fat. Eat a balanced diet with plenty of fruit and vegetables. Blood tests for lipids and cholesterol should begin at age 62 and be repeated every 5 years. If your lipid or cholesterol levels are high, you are over 50, or you are at high risk for heart disease, you may need your cholesterol levels checked more frequently. Ongoing high lipid and cholesterol levels should be treated with medicines if diet and exercise are not working.  . If you smoke, find out from your health care provider how to quit. If you do not use tobacco, please do not  start.  . If you choose to drink alcohol, please do not consume more than 2 drinks per day. One drink is considered to be 12 ounces (355 mL) of beer, 5 ounces (148 mL) of wine, or 1.5 ounces (44 mL) of liquor.  . If you are 52-35 years old, ask your health care provider if you should take aspirin to prevent strokes.  . Use sunscreen. Apply sunscreen liberally and repeatedly throughout the day. You should seek shade when your shadow is shorter than you. Protect yourself by wearing long sleeves, pants, a wide-brimmed hat, and sunglasses year round, whenever you are outdoors.  . Once a month, do a whole body skin exam, using a mirror to look at the skin on your back. Tell your health care provider of new moles, moles that have irregular borders, moles that are larger than a pencil eraser, or moles that have changed in shape or color.

## 2017-11-18 LAB — URINE CULTURE
MICRO NUMBER:: 90525273
Result:: NO GROWTH
SPECIMEN QUALITY:: ADEQUATE

## 2017-11-18 MED ORDER — TRAZODONE HCL 100 MG PO TABS: 200.0000 mg | ORAL_TABLET | Freq: Every day | ORAL | 1 refills | Status: DC

## 2017-11-18 NOTE — Progress Notes (Signed)
I reviewed health advisor's note, was available for consultation, and agree with documentation and plan.  Refilled Trazodone.

## 2017-11-24 ENCOUNTER — Encounter: Payer: Self-pay | Admitting: Primary Care

## 2017-11-24 ENCOUNTER — Ambulatory Visit (INDEPENDENT_AMBULATORY_CARE_PROVIDER_SITE_OTHER): Payer: Medicare Other | Admitting: Primary Care

## 2017-11-24 VITALS — BP 150/90 | HR 68 | Temp 98.0°F | Ht 65.0 in | Wt 147.2 lb

## 2017-11-24 DIAGNOSIS — Z1211 Encounter for screening for malignant neoplasm of colon: Secondary | ICD-10-CM | POA: Diagnosis not present

## 2017-11-24 DIAGNOSIS — G8929 Other chronic pain: Secondary | ICD-10-CM

## 2017-11-24 DIAGNOSIS — F5101 Primary insomnia: Secondary | ICD-10-CM | POA: Diagnosis not present

## 2017-11-24 DIAGNOSIS — E039 Hypothyroidism, unspecified: Secondary | ICD-10-CM | POA: Diagnosis not present

## 2017-11-24 DIAGNOSIS — I10 Essential (primary) hypertension: Secondary | ICD-10-CM | POA: Diagnosis not present

## 2017-11-24 DIAGNOSIS — E1151 Type 2 diabetes mellitus with diabetic peripheral angiopathy without gangrene: Secondary | ICD-10-CM | POA: Diagnosis not present

## 2017-11-24 DIAGNOSIS — E2839 Other primary ovarian failure: Secondary | ICD-10-CM | POA: Diagnosis not present

## 2017-11-24 DIAGNOSIS — D126 Benign neoplasm of colon, unspecified: Secondary | ICD-10-CM | POA: Diagnosis not present

## 2017-11-24 DIAGNOSIS — I5022 Chronic systolic (congestive) heart failure: Secondary | ICD-10-CM

## 2017-11-24 DIAGNOSIS — I2510 Atherosclerotic heart disease of native coronary artery without angina pectoris: Secondary | ICD-10-CM | POA: Diagnosis not present

## 2017-11-24 DIAGNOSIS — K219 Gastro-esophageal reflux disease without esophagitis: Secondary | ICD-10-CM | POA: Diagnosis not present

## 2017-11-24 DIAGNOSIS — J449 Chronic obstructive pulmonary disease, unspecified: Secondary | ICD-10-CM

## 2017-11-24 DIAGNOSIS — I5181 Takotsubo syndrome: Secondary | ICD-10-CM

## 2017-11-24 DIAGNOSIS — E782 Mixed hyperlipidemia: Secondary | ICD-10-CM

## 2017-11-24 DIAGNOSIS — M549 Dorsalgia, unspecified: Secondary | ICD-10-CM

## 2017-11-24 DIAGNOSIS — F4323 Adjustment disorder with mixed anxiety and depressed mood: Secondary | ICD-10-CM

## 2017-11-24 MED ORDER — TRAZODONE HCL 100 MG PO TABS: 100.0000 mg | ORAL_TABLET | Freq: Every day | ORAL | 1 refills | Status: DC

## 2017-11-24 MED ORDER — ENALAPRIL MALEATE 20 MG PO TABS
ORAL_TABLET | ORAL | 0 refills | Status: DC
Start: 2017-11-24 — End: 2018-05-17

## 2017-11-24 NOTE — Assessment & Plan Note (Signed)
Following with endocrinology. Home blood glucose levels seem improved. Continue current regimen.

## 2017-11-24 NOTE — Patient Instructions (Addendum)
We've increased your blood pressure medication, enalapril, to 20 mg. You may take two of your 10 mg tablets to equal 20 mg until your bottle is empty. I sent a new prescription to your pharmacy.  Start monitoring your blood pressure daily, around the same time of day, for the next 2 weeks.  Ensure that you have rested for 30 minutes prior to checking your blood pressure. Record your readings and bring them to your next visit.  You will be called regarding your bone density scan and colonoscopy.  Continue taking your levothyroxine as prescribed, on an empty stomach with water only. Do not take any other medications within 30 minutes.   Please consider meeting with one of our therapists as discussed.  Schedule a lab only appointment in 4 weeks to repeat your thyroid function.  Schedule a follow up visit in 2 weeks for blood pressure check.  It was a pleasure to see you today!

## 2017-11-24 NOTE — Assessment & Plan Note (Signed)
LDL above goal. Endorses compliance to rosuvastatin and Zetia. Will notify cardiology regarding recent labs.

## 2017-11-24 NOTE — Assessment & Plan Note (Signed)
Following with cardiology. 

## 2017-11-24 NOTE — Assessment & Plan Note (Signed)
Due for repeat colonoscopy, referral placed to GI. 

## 2017-11-24 NOTE — Assessment & Plan Note (Signed)
Doing well on Trazodone 100 mg HS, amitriptyline 50 mg HS, and cyclobenzaprine.

## 2017-11-24 NOTE — Progress Notes (Signed)
Subjective:    Patient ID: Sarah Payne, female    DOB: February 16, 1946, 72 y.o.   MRN: [id number]  HPI  Sarah Payne is a 72 year old female who presents today for Reserve Part 2. She saw our Health Advisor last week.  During her visit with our health advisor she endorsed urinary frequency with burning. Urinalysis was negative, urine culture was negative.  She's checking her glucose levels 4-5 times daily. She is following with endocrinology. AM fasting: 110-115 2 hours after lunch: 170's Bedtime: 150-160's.   She is taking her levothyroxine every morning with water only. She is not eating or taking her other medications within 30 minutes. She is not taking her pantoprazole at all. Denies calcium use. She follows with endocrinology.   She endorses compliance to her atorvastatin 40 mg BID and Zetia once daily. She also follows with cardiology.  She's feeling down/depressed as her husband is going through depression and relies on her for certain needs. She is managed on Trazodone 100 mg nightly and amitriptyline 50 mg HS. She has never met with a therapist in the past. PHQ 9 score of 16 on 11/17/17  BP Readings from Last 3 Encounters:  11/24/17 (!) 150/90  11/17/17 (!) 152/98  10/16/17 (!) 186/102      Immunizations: -Tetanus: Completed in 2014 -Influenza: Completed last sesaon -Pneumonia: Completed both. UTD -Shingles: Declines  Eye exam: Overdue, plans on scheduling. Colonoscopy: Completed in 2016, due now.  Dexa: Due, agrees Pap Smear: Hysterectomy  Mammogram: Declines  Hep C Screen: Completed in 2016   Review of Systems  Constitutional: Negative for unexpected weight change.  HENT: Negative for rhinorrhea.   Respiratory: Negative for cough and shortness of breath.   Cardiovascular: Negative for chest pain.  Gastrointestinal: Negative for constipation and diarrhea.  Genitourinary: Negative for difficulty urinating and menstrual problem.  Musculoskeletal: Negative for  arthralgias and myalgias.  Skin: Negative for rash.  Allergic/Immunologic: Negative for environmental allergies.  Neurological: Negative for dizziness, numbness and headaches.  Psychiatric/Behavioral: Negative for suicidal ideas.       See HPI. PHQ 9 score of 16   Wt Readings from Last 3 Encounters:  11/24/17 147 lb 4 oz (66.8 kg)  11/17/17 147 lb 8 oz (66.9 kg)  10/16/17 147 lb (66.7 kg)        Past Medical History:  Diagnosis Date  . Cataract    BILATERAL REMOVED  . Chronic low back pain   . COPD (chronic obstructive pulmonary disease) ([place])   . Coronary artery disease    a. multiple PCIs to the mLCx. b. stent to dRCA 08/2011 in setting of NSTEMI. c. DES to prox-mid LCx for ISR 09/2011. d.  DESx2 to prox-mRCA 07/2012. e. Takotsubo event 12/2015 with patent stents.  . Depression   . Diabetes mellitus   . Diverticulosis   . Frequent PVCs    a. Noted in hospital 12/2015.  [place]land Kitchen GERD (gastroesophageal reflux disease)   . Hyperlipidemia   . Hypertension   . Hypothyroidism   . Impingement syndrome of left shoulder   . Marijuana abuse   . Myocardial infarction ([place])    x 5  . Sleep apnea    mild-does not use cpap  . Takotsubo cardiomyopathy    a. 12/2015 - nephew committed suicide 1 week prior, sister died the morning of presentation - initially called a STEMI; cath with patent stents. LVEF 25-30%.  . Tendonitis of left rotator cuff   . Tobacco abuse   .  Vascular dementia      Social History   Socioeconomic History  . Marital status: Married    Spouse name: Not on file  . Number of children: Not on file  . Years of education: Not on file  . Highest education level: Not on file  Occupational History  . Not on file  Social Needs  . Financial resource strain: Not on file  . Food insecurity:    Worry: Not on file    Inability: Not on file  . Transportation needs:    Medical: Not on file    Non-medical: Not on file  Tobacco Use  . Smoking status: Current Every Day Smoker      Packs/day: 1.00    Years: 45.00    Pack years: 45.00    Types: Cigarettes  . Smokeless tobacco: Never Used  . Tobacco comment: Has cut back, trying to quit.   Substance and Sexual Activity  . Alcohol use: No    Alcohol/week: 0.0 oz  . Drug use: Yes    Types: Marijuana    Comment: last night 4.13.16  . Sexual activity: Not on file  Lifestyle  . Physical activity:    Days per week: Not on file    Minutes per session: Not on file  . Stress: Not on file  Relationships  . Social connections:    Talks on phone: Not on file    Gets together: Not on file    Attends religious service: Not on file    Active member of club or organization: Not on file    Attends meetings of clubs or organizations: Not on file    Relationship status: Not on file  . Intimate partner violence:    Fear of current or ex partner: Not on file    Emotionally abused: Not on file    Physically abused: Not on file    Forced sexual activity: Not on file  Other Topics Concern  . Not on file  Social History Narrative   Lives at home with her husband in [place].  Previously used marijuana - quit.      Regular exercise: no/ pain from a frozen rotator cuff   Caffeine use: coffee daily and pepsi      Does not have a living will.   Daughters and husband know her wishes- would desire CPR but not prolonged life support if futile    Past Surgical History:  Procedure Laterality Date  . ABDOMINAL HYSTERECTOMY    . APPENDECTOMY    . BACK SURGERY    . CARDIAC CATHETERIZATION  2013  . CARDIAC CATHETERIZATION  2016  . CARDIAC CATHETERIZATION N/A 01/01/2016   Procedure: Left Heart Cath and Coronary Angiography;  Surgeon: Jettie Booze, MD;  Location: [facility];  Service: Cardiovascular;  Laterality: N/A;  . CATARACT EXTRACTION W/PHACO Left 01/30/2015   Procedure: CATARACT EXTRACTION PHACO AND INTRAOCULAR LENS PLACEMENT (IOC);  Surgeon: Birder Robson, MD;  Location: [facility];  Service:  Ophthalmology;  Laterality: Left;  [place] 00:47   . CATARACT EXTRACTION W/PHACO Right 02/13/2015   Procedure: CATARACT EXTRACTION PHACO AND INTRAOCULAR LENS PLACEMENT (IOC);  Surgeon: Birder Robson, MD;  Location: [facility];  Service: Ophthalmology;  Laterality: Right;  cassette lot # [id number] H [place]  00:29.9 AP  20.7 CDE  6.20  . COLONOSCOPY N/A 11/02/2014   Procedure: COLONOSCOPY;  Surgeon: Inda Castle, MD;  Location: [facility];  Service: Endoscopy;  Laterality: N/A;  . CORONARY ANGIOPLASTY  2012  stent x 3   . CORONARY ANGIOPLASTY WITH STENT PLACEMENT  2013    Family History  Problem Relation Age of Onset  . Heart attack Mother        First MI @ 3 - Died @ 56  . Heart disease Mother   . Heart disease Father        Died @ 8  . Throat cancer Brother   . Liver cancer Brother   . Colon cancer Sister     Allergies  Allergen Reactions  . No Known Allergies     Current Outpatient Medications on File Prior to Visit  Medication Sig Dispense Refill  . amitriptyline (ELAVIL) 50 MG tablet Take 1 tablet (50 mg total) by mouth at bedtime. 30 tablet 2  . aspirin 81 MG tablet Take 81 mg by mouth daily.      . Calcium & Magnesium Carbonates (MYLANTA PO) Take 15 mLs by mouth as needed (for indegestion).     . carvedilol (COREG) 3.125 MG tablet Take 2 tablets (6.25 mg total) by mouth 2 (two) times daily with a meal. 360 tablet 3  . clopidogrel (PLAVIX) 75 MG tablet TAKE 1 TABLET (75 MG TOTAL) BY MOUTH DAILY. 90 tablet 3  . cyclobenzaprine (FLEXERIL) 10 MG tablet Take 0.5-1 tablets (5-10 mg total) by mouth 3 (three) times daily as needed for muscle spasms. 30 tablet 0  . ezetimibe (ZETIA) 10 MG tablet Take 1 tablet (10 mg total) by mouth daily. 90 tablet 3  . glucose blood (FREESTYLE LITE) test strip USE 1 STRIP TO CHECK BLOOD SUGAR 5 TIMES DAILY FOR UNCONTROLLED DIABETES MELLITUS 400 each 5  . glucose monitoring kit (FREESTYLE) monitoring kit 1 each by Does not apply route as needed for  other. PHARMACY PLEASE SUBSTITUTE WHATEVER GLUCOMETER YOU CARRY 1 each 1  . insulin aspart (NOVOLOG FLEXPEN) 100 UNIT/ML FlexPen Inject 14-22 Units into the skin 3 (three) times daily with meals. Normally takes 12 units 15 pen 3  . Insulin Glargine (LANTUS SOLOSTAR) 100 UNIT/ML Solostar Pen Inject 40 Units into the skin at bedtime. 15 pen 3  . Insulin Pen Needle (INSUPEN PEN NEEDLES) 32G X 4 MM MISC Use to inject insulin 4 times daily. 250 each 1  . isosorbide mononitrate (IMDUR) 30 MG 24 hr tablet Take 1 tablet (30 mg total) by mouth daily. 90 tablet 3  . lansoprazole (PREVACID) 15 MG capsule Take 15 mg by mouth daily. Reported on 11/06/2015    . levothyroxine (SYNTHROID, LEVOTHROID) 125 MCG tablet Take 1 tablet (125 mcg total) by mouth daily. 90 tablet 3  . meloxicam (MOBIC) 15 MG tablet Take 1 tablet (15 mg total) by mouth daily. 30 tablet 2  . pantoprazole (PROTONIX) 40 MG tablet Take 1 tablet (40 mg total) by mouth daily. 90 tablet 3  . rosuvastatin (CRESTOR) 40 MG tablet Take 1 tablet (40 mg total) by mouth daily. 90 tablet 3  . sitaGLIPtin (JANUVIA) 100 MG tablet Take 1 tablet (100 mg total) by mouth daily. 90 tablet 3   No current facility-administered medications on file prior to visit.     BP (!) 150/90   Pulse 68   Temp 98 F (36.7 C) (Oral)   Ht 5' 5" (1.651 m)   Wt 147 lb 4 oz (66.8 kg)   SpO2 96%   BMI 24.50 kg/m    Objective:   Physical Exam  Constitutional: She is oriented to person, place, and time. She appears well-nourished.  HENT:  Right Ear: Tympanic membrane and ear canal normal.  Left Ear: Tympanic membrane and ear canal normal.  Nose: Nose normal.  Mouth/Throat: Oropharynx is clear and moist.  Eyes: Pupils are equal, round, and reactive to light. Conjunctivae and EOM are normal.  Neck: Neck supple. No thyromegaly present.  Cardiovascular: Normal rate and regular rhythm.  No murmur heard. Pulmonary/Chest: Effort normal and breath sounds normal. She has no  rales.  Abdominal: Soft. Bowel sounds are normal. There is no tenderness.  Musculoskeletal: Normal range of motion.  Lymphadenopathy:    She has no cervical adenopathy.  Neurological: She is alert and oriented to person, place, and time. She has normal reflexes. No cranial nerve deficit.  Skin: Skin is warm and dry. No rash noted.  Psychiatric: She has a normal mood and affect.          Assessment & Plan:

## 2017-11-24 NOTE — Assessment & Plan Note (Signed)
Doing better on amitriptyline HS and cyclobenzaprine. Discussed to continue reduced dose of Trazodone at 100 mg HS to avoid excess serotonin.

## 2017-11-24 NOTE — Assessment & Plan Note (Signed)
Following with endocrinology. Recent TSH of 10. She is taking medication appropriately, no PPI use, calcium, magnesium use at all. Will repeat TSH in 4 weeks, will send result to endocrinology as FYI.

## 2017-11-24 NOTE — Assessment & Plan Note (Signed)
Managed on ACE, beta blocker. Appears euvolemic, weight stable. Following with cardiology.

## 2017-11-24 NOTE — Assessment & Plan Note (Signed)
PHQ 9 score of 16 last week. Managed on Trazodone and amitriptyline. Discussed options for treatment including therapy as she would likely benefit. She will think about this and notify us at her next visit in 2 weeks. If she declines then consider psychiatry evaluation.

## 2017-11-24 NOTE — Assessment & Plan Note (Signed)
Not taking either pantoprazole or lansoprazole. Continue off both.

## 2017-11-24 NOTE — Assessment & Plan Note (Signed)
Stable, not on inhalers. Lungs clear on exam.

## 2017-11-24 NOTE — Assessment & Plan Note (Signed)
Uncontrolled. Increase enalapril to 20 mg. Little to no room for beta blocker adjustment. Continue Imdur.   Follow up in 2 weeks for BP check.

## 2017-11-24 NOTE — Assessment & Plan Note (Signed)
Managed on statin, LDL above goal. Will send recent lipid panel results to cardiology as FYI. Will treat hypertension today.

## 2017-12-08 ENCOUNTER — Telehealth: Payer: Self-pay | Admitting: *Deleted

## 2017-12-08 ENCOUNTER — Ambulatory Visit: Payer: Medicare Other | Admitting: Primary Care

## 2017-12-08 DIAGNOSIS — I2510 Atherosclerotic heart disease of native coronary artery without angina pectoris: Secondary | ICD-10-CM

## 2017-12-08 DIAGNOSIS — Z0289 Encounter for other administrative examinations: Secondary | ICD-10-CM

## 2017-12-08 NOTE — Telephone Encounter (Signed)
-----   Message from Minna Merritts, MD sent at 11/25/2017 11:21 PM EDT ----- Regarding: RE: Lipids Triage,  Can we see if she would be interested in repatha or praluent If yes, we can put a script through thx TG  ----- Message ----- From: [name], NP Sent: 11/24/2017   5:07 PM To: Minna Merritts, MD Subject: Lipids                                         Hi Dr. Rockey Situ,  I saw our patient today for follow up. Lipids with improved LDL of 152 on Zetia 10 mg and rosuvastatin 40 mg.   She is compliant to her aspirin, rosuvastatin, Zetia. Given her significant history of CAD do you think she'd be a good candidate for the PCSK9 inhibitors via lipid clinic?  I'm really not sure if there's anything else that I can do for her so I just wanted to give you the heads up.  Have a good evening! Allie Bossier, NP-C

## 2017-12-08 NOTE — Telephone Encounter (Signed)
No answer. No voicemail. 

## 2017-12-18 NOTE — Telephone Encounter (Signed)
Spoke with patient and reviewed Dr. Donivan Scull recommendations regarding start of injectable cholesterol medication. She stated that she has so much going on right now. Reviewed medication options and our lipid clinic in [place]. She was agreeable with this plan and will place referral for her to go and speak with pharmacist regarding these medications.

## 2017-12-28 ENCOUNTER — Encounter: Payer: Self-pay | Admitting: Family Medicine

## 2017-12-28 ENCOUNTER — Encounter: Payer: Self-pay | Admitting: Primary Care

## 2017-12-28 ENCOUNTER — Ambulatory Visit (INDEPENDENT_AMBULATORY_CARE_PROVIDER_SITE_OTHER): Payer: Medicare Other | Admitting: Family Medicine

## 2017-12-28 VITALS — BP 102/68 | HR 94 | Temp 97.6°F | Ht 65.0 in | Wt 145.2 lb

## 2017-12-28 DIAGNOSIS — K529 Noninfective gastroenteritis and colitis, unspecified: Secondary | ICD-10-CM

## 2017-12-28 DIAGNOSIS — R112 Nausea with vomiting, unspecified: Secondary | ICD-10-CM | POA: Diagnosis not present

## 2017-12-28 DIAGNOSIS — I2510 Atherosclerotic heart disease of native coronary artery without angina pectoris: Secondary | ICD-10-CM | POA: Diagnosis not present

## 2017-12-28 MED ORDER — ONDANSETRON 4 MG PO TBDP
8.0000 mg | ORAL_TABLET | Freq: Once | ORAL | Status: AC
  Administered 2017-12-28: 8 mg via ORAL

## 2017-12-28 MED ORDER — ONDANSETRON 8 MG PO TBDP: 8.0000 mg | ORAL_TABLET | Freq: Three times a day (TID) | ORAL | 0 refills | Status: DC | PRN

## 2017-12-28 NOTE — Progress Notes (Signed)
Subjective:    Patient ID: Sarah Payne, female    DOB: 08/25/1945, 72 y.o.   MRN: [id number]  HPI This is a 72 yo female who presents today with nausea and vomiting. Started yesterday morning when she was drinking her coffee. Vomited 5 times yesterday, none today. Some crampy abdominal pain, no sharp or severe pain. No diarrhea, a little constipation recently. Subjective low grade fever yesterday. No sick contacts. Has not recently eaten out. Previously was feeling fine. Blood sugars running normal. Has been able to keep water down today, ate a piece of dry toast yesterday and threw it up. No dysuria, no hematuria, no back pain. Slept well last night.    Past Medical History:  Diagnosis Date  . Cataract    BILATERAL REMOVED  . Chronic low back pain   . COPD (chronic obstructive pulmonary disease) ([place])   . Coronary artery disease    a. multiple PCIs to the mLCx. b. stent to dRCA 08/2011 in setting of NSTEMI. c. DES to prox-mid LCx for ISR 09/2011. d.  DESx2 to prox-mRCA 07/2012. e. Takotsubo event 12/2015 with patent stents.  . Depression   . Diabetes mellitus   . Diverticulosis   . Frequent PVCs    a. Noted in hospital 12/2015.  Marland Kitchen GERD (gastroesophageal reflux disease)   . Hyperlipidemia   . Hypertension   . Hypothyroidism   . Impingement syndrome of left shoulder   . Marijuana abuse   . Myocardial infarction ([place])    x 5  . Sleep apnea    mild-does not use cpap  . Takotsubo cardiomyopathy    a. 12/2015 - nephew committed suicide 1 week prior, sister died the morning of presentation - initially called a STEMI; cath with patent stents. LVEF 25-30%.  . Tendonitis of left rotator cuff   . Tobacco abuse   . Vascular dementia    Past Surgical History:  Procedure Laterality Date  . ABDOMINAL HYSTERECTOMY    . APPENDECTOMY    . BACK SURGERY    . CARDIAC CATHETERIZATION  2013  . CARDIAC CATHETERIZATION  2016  . CARDIAC CATHETERIZATION N/A 01/01/2016   Procedure: Left Heart Cath and  Coronary Angiography;  Surgeon: Jettie Booze, MD;  Location: [facility];  Service: Cardiovascular;  Laterality: N/A;  . CATARACT EXTRACTION W/PHACO Left 01/30/2015   Procedure: CATARACT EXTRACTION PHACO AND INTRAOCULAR LENS PLACEMENT (IOC);  Surgeon: Birder Robson, MD;  Location: [facility];  Service: Ophthalmology;  Laterality: Left;  [place] 00:47   . CATARACT EXTRACTION W/PHACO Right 02/13/2015   Procedure: CATARACT EXTRACTION PHACO AND INTRAOCULAR LENS PLACEMENT (IOC);  Surgeon: Birder Robson, MD;  Location: [facility];  Service: Ophthalmology;  Laterality: Right;  cassette lot # [id number] H [place]  00:29.9 AP  20.7 CDE  6.20  . COLONOSCOPY N/A 11/02/2014   Procedure: COLONOSCOPY;  Surgeon: Inda Castle, MD;  Location: [facility];  Service: Endoscopy;  Laterality: N/A;  . CORONARY ANGIOPLASTY  2012   stent x 3   . CORONARY ANGIOPLASTY WITH STENT PLACEMENT  2013   Family History  Problem Relation Age of Onset  . Heart attack Mother        First MI @ 110 - Died @ 62  . Heart disease Mother   . Heart disease Father        Died @ 90  . Throat cancer Brother   . Liver cancer Brother   . Colon cancer Sister    Social History  Tobacco Use  . Smoking status: Current Every Day Smoker    Packs/day: 1.00    Years: 45.00    Pack years: 45.00    Types: Cigarettes  . Smokeless tobacco: Never Used  . Tobacco comment: Has cut back, trying to quit.   Substance Use Topics  . Alcohol use: No    Alcohol/week: 0.0 oz  . Drug use: Yes    Types: Marijuana    Comment: last night 4.13.16      Review of Systems Per HPI    Objective:   Physical Exam  Constitutional: She is oriented to person, place, and time. She appears well-developed and well-nourished. No distress.  HENT:  Head: Normocephalic and atraumatic.  Eyes: Conjunctivae are normal.  Cardiovascular: Normal rate, regular rhythm and normal heart sounds.  Pulmonary/Chest: Effort normal and breath sounds normal.    Abdominal: Soft. Bowel sounds are increased. There is tenderness in the epigastric area. There is no rigidity, no rebound and no guarding.  Neurological: She is alert and oriented to person, place, and time.  Skin: Skin is warm and dry. She is not diaphoretic.  Vitals reviewed.     BP 102/68   Pulse 94   Temp 97.6 F (36.4 C) (Oral)   Ht 5\' 5"  (1.651 m)   Wt 145 lb 4 oz (65.9 kg)   BMI 24.17 kg/m  Wt Readings from Last 3 Encounters:  12/28/17 145 lb 4 oz (65.9 kg)  11/24/17 147 lb 4 oz (66.8 kg)  11/17/17 147 lb 8 oz (66.9 kg)       Assessment & Plan:  1. Non-intractable vomiting with nausea, unspecified vomiting type - ondansetron (ZOFRAN-ODT) disintegrating tablet 8 mg - ondansetron (ZOFRAN-ODT) 8 MG disintegrating tablet; Take 1 tablet (8 mg total) by mouth every 8 (eight) hours as needed for nausea.  Dispense: 20 tablet; Refill: 0  2. Gastroenteritis - Provided written and verbal information regarding diagnosis and treatment. - Instructed on hydration, advancing diet, insulin administration, RTC/ER precautions reviewed   Clarene Reamer, FNP-BC  [facility] [place]  12/28/2017 9:02 PM

## 2017-12-28 NOTE — Patient Instructions (Addendum)
Good to see you today  I hope you are feeling better soon  Continue to drink enough water to make your urine light yellow  Start with small amounts bland food  Hold your Novolog insulin until your blood sugar is over 200, continue to take your night time insulin    Viral Gastroenteritis, Adult Viral gastroenteritis is also known as the stomach flu. This condition is caused by certain germs (viruses). These germs can be passed from person to person very easily (are very contagious). This condition can cause sudden watery poop (diarrhea), fever, and throwing up (vomiting). Having watery poop and throwing up can make you feel weak and cause you to get dehydrated. Dehydration can make you tired and thirsty, make you have a dry mouth, and make it so you pee (urinate) less often. Older adults and people with other diseases or a weak defense system (immune system) are at higher risk for dehydration. It is important to replace the fluids that you lose from having watery poop and throwing up. Follow these instructions at home: Follow instructions from your doctor about how to care for yourself at home. Eating and drinking  Follow these instructions as told by your doctor:  Take an oral rehydration solution (ORS). This is a drink that is sold at pharmacies and stores.  Drink clear fluids in small amounts as you are able, such as: ? Water. ? Ice chips. ? Diluted fruit juice. ? Low-calorie sports drinks.  Eat bland, easy-to-digest foods in small amounts as you are able, such as: ? Bananas. ? Applesauce. ? Rice. ? Low-fat (lean) meats. ? Toast. ? Crackers.  Avoid fluids that have a lot of sugar or caffeine in them.  Avoid alcohol.  Avoid spicy or fatty foods.  General instructions  Drink enough fluid to keep your pee (urine) clear or pale yellow.  Wash your hands often. If you cannot use soap and water, use hand sanitizer.  Make sure that all people in your home wash their hands  well and often.  Rest at home while you get better.  Take over-the-counter and prescription medicines only as told by your doctor.  Watch your condition for any changes.  Take a warm bath to help with any burning or pain from having watery poop.  Keep all follow-up visits as told by your doctor. This is important. Contact a doctor if:  You cannot keep fluids down.  Your symptoms get worse.  You have new symptoms.  You feel light-headed or dizzy.  You have muscle cramps. Get help right away if:  You have chest pain.  You feel very weak or you pass out (faint).  You see blood in your throw-up.  Your throw-up looks like coffee grounds.  You have bloody or black poop (stools) or poop that look like tar.  You have a very bad headache, a stiff neck, or both.  You have a rash.  You have very bad pain, cramping, or bloating in your belly (abdomen).  You have trouble breathing.  You are breathing very quickly.  Your heart is beating very quickly.  Your skin feels cold and clammy.  You feel confused.  You have pain when you pee.  You have signs of dehydration, such as: ? Dark pee, hardly any pee, or no pee. ? Cracked lips. ? Dry mouth. ? Sunken eyes. ? Sleepiness. ? Weakness. This information is not intended to replace advice given to you by your health care provider. Make sure you discuss any  questions you have with your health care provider. Document Released: 12/24/2007 Document Revised: 01/25/2016 Document Reviewed: 03/13/2015 Elsevier Interactive Patient Education  2017 [employer].

## 2017-12-29 ENCOUNTER — Encounter: Payer: Medicare Other | Admitting: Student in an Organized Health Care Education/Training Program

## 2018-01-01 ENCOUNTER — Telehealth: Payer: Self-pay | Admitting: Internal Medicine

## 2018-01-01 NOTE — Telephone Encounter (Signed)
Faxed through epic

## 2018-01-01 NOTE — Telephone Encounter (Signed)
[facility] is calling in regards for order that was put in for glucose monitor They need office notes from the most recent visit  [facility]- [phone]

## 2018-01-05 ENCOUNTER — Encounter: Payer: Self-pay | Admitting: *Deleted

## 2018-01-05 ENCOUNTER — Ambulatory Visit
Admission: RE | Admit: 2018-01-05 | Discharge: 2018-01-05 | Disposition: A | Discharge status: Home / Self Care | Payer: Medicare Other | Source: Ambulatory Visit | Attending: Primary Care | Admitting: Primary Care

## 2018-01-05 DIAGNOSIS — E2839 Other primary ovarian failure: Secondary | ICD-10-CM | POA: Diagnosis not present

## 2018-01-05 DIAGNOSIS — Z13820 Encounter for screening for osteoporosis: Secondary | ICD-10-CM | POA: Diagnosis not present

## 2018-01-11 ENCOUNTER — Other Ambulatory Visit: Payer: Self-pay | Admitting: Student in an Organized Health Care Education/Training Program

## 2018-01-12 ENCOUNTER — Ambulatory Visit: Payer: Medicare Other | Admitting: Pharmacist

## 2018-01-12 NOTE — Progress Notes (Deleted)
Patient ID: [name]                 DOB: 1946/06/14                    MRN: [id number]     HPI: Sarah Payne is a 72 y.o. female patient referred to lipid clinic by Dr Sarah Payne. PMH is significant for CAD s/p DES x3, STEMI 12/2015, DM, HTN, and depression. Her LDL remains 152 above goal < 70 on max dose rosuvastatin and ezetimibe and she presents to lipid clinic for further management.  Still smoking 1 PPD [id number] [employer]  Current Medications: rosuvastatin 27m daily, ezetimibe 160mdaily Risk Factors: CAD s/p DES, STEMI, DM, HTN, tobacco abuse LDL goal: <7015mL  Diet:   Exercise:   Family History: Mother with first MI at 54,81ied at 73.30ather died at 90 35om heart disease.  Social History: The patient  reports that she smokes 1 PPD.  She has a 45.00 pack-year smoking history. she has never used smokeless tobacco. She reports that she uses drugs. Drug: Marijuana. She reports that she does not drink alcohol.   Labs: 11/17/17: TC 218, TG 201, HDL 43, LDL-D 152, nonHDL 175 (rosuvastatin 29m32mily, ezetimibe 10mg65mly)  Past Medical History:  Diagnosis Date  . Cataract    BILATERAL REMOVED  . Chronic low back pain   . COPD (chronic obstructive pulmonary disease) ([facility]) [place] Coronary artery disease    a. multiple PCIs to the mLCx. b. stent to dRCA 08/2011 in setting of NSTEMI. c. DES to prox-mid LCx for ISR 09/2011. d.  DESx2 to prox-mRCA 07/2012. e. Takotsubo event 12/2015 with patent stents.  . Depression   . Diabetes mellitus   . Diverticulosis   . Frequent PVCs    a. Noted in hospital 12/2015.  . GERMarland Kitchen (gastroesophageal reflux disease)   . Hyperlipidemia   . Hypertension   . Hypothyroidism   . Impingement syndrome of left shoulder   . Marijuana abuse   . Myocardial infarction ([facility]) [place]  . Sleep apnea    mild-does not use cpap  . Takotsubo cardiomyopathy    a. 12/2015 - nephew committed suicide 1 week prior, sister died the morning of presentation - initially  called a STEMI; cath with patent stents. LVEF 25-30%.  . Tendonitis of left rotator cuff   . Tobacco abuse   . Vascular dementia     Current Outpatient Medications on File Prior to Visit  Medication Sig Dispense Refill  . amitriptyline (ELAVIL) 50 MG tablet Take 1 tablet (50 mg total) by mouth at bedtime. 30 tablet 2  . aspirin 81 MG tablet Take 81 mg by mouth daily.      . Calcium & Magnesium Carbonates (MYLANTA PO) Take 15 mLs by mouth as needed (for indegestion).     . carvedilol (COREG) 3.125 MG tablet Take 2 tablets (6.25 mg total) by mouth 2 (two) times daily with a meal. 360 tablet 3  . clopidogrel (PLAVIX) 75 MG tablet TAKE 1 TABLET (75 MG TOTAL) BY MOUTH DAILY. 90 tablet 3  . cyclobenzaprine (FLEXERIL) 10 MG tablet Take 0.5-1 tablets (5-10 mg total) by mouth 3 (three) times daily as needed for muscle spasms. 30 tablet 0  . enalapril (VASOTEC) 20 MG tablet Take 1 tablet by mouth once daily for blood pressure. 90 tablet 0  . ezetimibe (ZETIA) 10 MG tablet Take 1 tablet (10 mg  total) by mouth daily. 90 tablet 3  . glucose blood (FREESTYLE LITE) test strip USE 1 STRIP TO CHECK BLOOD SUGAR 5 TIMES DAILY FOR UNCONTROLLED DIABETES MELLITUS 400 each 5  . glucose monitoring kit (FREESTYLE) monitoring kit 1 each by Does not apply route as needed for other. PHARMACY PLEASE SUBSTITUTE WHATEVER GLUCOMETER YOU CARRY 1 each 1  . insulin aspart (NOVOLOG FLEXPEN) 100 UNIT/ML FlexPen Inject 14-22 Units into the skin 3 (three) times daily with meals. Normally takes 12 units 15 pen 3  . Insulin Glargine (LANTUS SOLOSTAR) 100 UNIT/ML Solostar Pen Inject 40 Units into the skin at bedtime. 15 pen 3  . Insulin Pen Needle (INSUPEN PEN NEEDLES) 32G X 4 MM MISC Use to inject insulin 4 times daily. 250 each 1  . isosorbide mononitrate (IMDUR) 30 MG 24 hr tablet Take 1 tablet (30 mg total) by mouth daily. 90 tablet 3  . levothyroxine (SYNTHROID, LEVOTHROID) 125 MCG tablet Take 1 tablet (125 mcg total) by mouth  daily. 90 tablet 3  . meloxicam (MOBIC) 15 MG tablet Take 1 tablet (15 mg total) by mouth daily. 30 tablet 2  . ondansetron (ZOFRAN-ODT) 8 MG disintegrating tablet Take 1 tablet (8 mg total) by mouth every 8 (eight) hours as needed for nausea. 20 tablet 0  . pantoprazole (PROTONIX) 40 MG tablet Take 1 tablet (40 mg total) by mouth daily. 90 tablet 3  . rosuvastatin (CRESTOR) 40 MG tablet Take 1 tablet (40 mg total) by mouth daily. 90 tablet 3  . sitaGLIPtin (JANUVIA) 100 MG tablet Take 1 tablet (100 mg total) by mouth daily. 90 tablet 3  . traZODone (DESYREL) 100 MG tablet Take 1 tablet (100 mg total) by mouth at bedtime. 90 tablet 1   No current facility-administered medications on file prior to visit.     Allergies  Allergen Reactions  . No Known Allergies     Assessment/Plan:  1. Hyperlipidemia -

## 2018-01-18 ENCOUNTER — Ambulatory Visit (INDEPENDENT_AMBULATORY_CARE_PROVIDER_SITE_OTHER): Payer: Medicare Other | Admitting: Internal Medicine

## 2018-01-18 ENCOUNTER — Encounter: Payer: Self-pay | Admitting: Internal Medicine

## 2018-01-18 VITALS — BP 146/80 | HR 70 | Ht 65.0 in | Wt 147.6 lb

## 2018-01-18 DIAGNOSIS — E782 Mixed hyperlipidemia: Secondary | ICD-10-CM | POA: Diagnosis not present

## 2018-01-18 DIAGNOSIS — E1151 Type 2 diabetes mellitus with diabetic peripheral angiopathy without gangrene: Secondary | ICD-10-CM

## 2018-01-18 DIAGNOSIS — E039 Hypothyroidism, unspecified: Secondary | ICD-10-CM

## 2018-01-18 DIAGNOSIS — I2510 Atherosclerotic heart disease of native coronary artery without angina pectoris: Secondary | ICD-10-CM | POA: Diagnosis not present

## 2018-01-18 LAB — T4, FREE: Free T4: 0.95 ng/dL (ref 0.60–1.60)

## 2018-01-18 LAB — POCT GLYCOSYLATED HEMOGLOBIN (HGB A1C): Hemoglobin A1C: 9.2 % — AB (ref 4.0–5.6)

## 2018-01-18 LAB — TSH: TSH: 2.26 u[IU]/mL (ref 0.35–4.50)

## 2018-01-18 NOTE — Addendum Note (Signed)
Addended by: Drucilla Schmidt on: 01/18/2018 01:56 PM   Modules accepted: Orders

## 2018-01-18 NOTE — Patient Instructions (Signed)
Please continue: - Lantus 40 units at bedtime - Januvia 100 mg in am, before b'fast - NovoLog 14 units before a smaller meal 15 units before a larger meal or if you have dessert  If sugars before the meal are: 70 or lower, do not take insulin before that meal 71- 99, take no more than 10 units with that meal 100 or above, take the whole dose of insulin  Do not skip any insulin doses.  Please return in 3 months with your sugar log.

## 2018-01-18 NOTE — Progress Notes (Signed)
Patient ID: Sarah Payne, female   DOB: [birth year], 72 y.o.   MRN: [id number]  HPI: Sarah Payne is a 72 y.o.-year-old female, returning for f/u for DM2, dx 2003, insulin-dependent since 2013, uncontrolled, with complications (CAD, sCHF, PAD, CKD, PN) and also complicated by dementia and medication noncompliance, also hypothyroidism, uncontrolled. Last visit 3 months ago.  Since last visit, and especially since her last visit with PCP in 10/2017, she started to take her medication more consistently and her sugars improved.  They are still fluctuating, as she is still missing quite a few doses, especially NovoLog.  DM2: Last hemoglobin A1c: Lab Results  Component Value Date   HGBA1C 10.2 10/16/2017   HGBA1C 10.8 07/17/2017   HGBA1C 11.0 04/17/2017   Pt is on a regimen of: - Lantus 40 units at bedtime - Januvia 100 mg in am, before b'fast - NovoLog - skips doses 14 units before a smaller meal 15 units before a larger meal or if you have dessert If sugars before the meal are: 70 or lower, do not take insulin before that meal 71- 99, take no more than 8 units with that meal 100 or above, take the whole dose of insulin  Pt checks her sugars 1-2X a day: - am: 174-341 >> 101-190, 235, 288 (forgot Lantus) >> 109-187, 227, 255 - 2h after b'fast: 245-400 >> n/c >> 98 >> 190, 210 >> 98-170 - before lunch: 124, 266, 368 >> 422 >> 200 >> n/c - 2h after lunch: n/c >> 117-361 >> 200-230 >> 64-181, 207, 217 - before dinner: 179-230 >> 210, 269 >> 78 x1 (not eating, sick) >> 59-130 - 2h after dinner: 55-285 >> n/c >> 189 >> 270-289 >> 92, 198-282, 334 (sick) - bedtime:  69, 150-242 >> n/c >> 263 >> n/c >> 56, 96-207, 258 - nighttime: 54, 58, 145 >> 270 >> n/c Lowest sugar was 98 >> 78 >> 56. Highest sugar was 422 >> 288 >> 334.  -+ Mild CKD, last BUN/creatinine:  Lab Results  Component Value Date   BUN 12 11/17/2017   CREATININE 0.99 11/17/2017  On enalapril. -+ HL; last set of  lipids: Lab Results  Component Value Date   CHOL 218 (H) 11/17/2017   HDL 43.00 11/17/2017   LDLCALC 146 (H) 03/12/2015   LDLDIRECT 152.0 11/17/2017   TRIG 201.0 (H) 11/17/2017   CHOLHDL 5 11/17/2017  She is on Crestor and Zetia. She is on  ASA 81. - last eye exam was in 2016, no DR.   she has a history of cataract surgery. - + Numbness and tingling in her feet.  Hypothyroidism: -With medication noncompliance  Pt is on levothyroxine 125 mcg daily, taken: - in am - fasting - at least 30 min from b'fast - no Ca, Fe, MVI, PPIs - not on Biotin  Latest TSH was again high: Lab Results  Component Value Date   TSH 10.50 (H) 11/17/2017   TSH 2.38 04/17/2017   TSH 0.07 (L) 09/18/2016   TSH 0.36 04/17/2016   TSH 0.24 (L) 02/06/2016   TSH 51.35 (H) 09/20/2015   TSH 0.52 06/18/2015   TSH 53.23 (H) 06/01/2015   TSH 40.79 (H) 03/12/2015   TSH 53.83 (H) 12/21/2014   FREET4 0.78 04/17/2017   FREET4 0.97 09/18/2016   FREET4 1.21 04/17/2016   FREET4 2.16 (H) 02/06/2016   FREET4 0.27 (L) 09/20/2015   FREET4 0.57 (L) 03/12/2015   FREET4 0.21 (L) 12/21/2014   FREET4 0.30 (L) 08/16/2014  FREET4 1.03 04/26/2014   FREET4 1.58 11/22/2013   Pt denies: - feeling nodules in neck - hoarseness - dysphagia - choking - SOB with lying down  Her sister died, her nephew killed himself and she had an AMI in summer 2017.  ROS: Constitutional: no weight gain/no weight loss, no fatigue, no subjective hyperthermia, no subjective hypothermia Eyes: no blurry vision, no xerophthalmia ENT: no sore throat, no nodules palpated in throat, no dysphagia, no odynophagia, no hoarseness Cardiovascular: no CP/no SOB/no palpitations/no leg swelling Respiratory: no cough/no SOB/no wheezing Gastrointestinal: no N/no V/no D/no C/no acid reflux Musculoskeletal: no muscle aches/no joint aches Skin: no rashes, no hair loss Neurological: no tremors/+ numbness/+ tingling/no dizziness  I reviewed pt's  medications, allergies, PMH, social hx, family hx, and changes were documented in the history of present illness. Otherwise, unchanged from my initial visit note.  Past Medical History:  Diagnosis Date  . Cataract    BILATERAL REMOVED  . Chronic low back pain   . COPD (chronic obstructive pulmonary disease) ([place])   . Coronary artery disease    a. multiple PCIs to the mLCx. b. stent to dRCA 08/2011 in setting of NSTEMI. c. DES to prox-mid LCx for ISR 09/2011. d.  DESx2 to prox-mRCA 07/2012. e. Takotsubo event 12/2015 with patent stents.  . Depression   . Diabetes mellitus   . Diverticulosis   . Frequent PVCs    a. Noted in hospital 12/2015.  Marland Kitchen GERD (gastroesophageal reflux disease)   . Hyperlipidemia   . Hypertension   . Hypothyroidism   . Impingement syndrome of left shoulder   . Marijuana abuse   . Myocardial infarction ([place])    x 5  . Sleep apnea    mild-does not use cpap  . Takotsubo cardiomyopathy    a. 12/2015 - nephew committed suicide 1 week prior, sister died the morning of presentation - initially called a STEMI; cath with patent stents. LVEF 25-30%.  . Tendonitis of left rotator cuff   . Tobacco abuse   . Vascular dementia    Past Surgical History:  Procedure Laterality Date  . ABDOMINAL HYSTERECTOMY    . APPENDECTOMY    . BACK SURGERY    . CARDIAC CATHETERIZATION  2013  . CARDIAC CATHETERIZATION  2016  . CARDIAC CATHETERIZATION N/A 01/01/2016   Procedure: Left Heart Cath and Coronary Angiography;  Surgeon: Jettie Booze, MD;  Location: [facility];  Service: Cardiovascular;  Laterality: N/A;  . CATARACT EXTRACTION W/PHACO Left 01/30/2015   Procedure: CATARACT EXTRACTION PHACO AND INTRAOCULAR LENS PLACEMENT (IOC);  Surgeon: Birder Robson, MD;  Location: [facility];  Service: Ophthalmology;  Laterality: Left;  [place] 00:47   . CATARACT EXTRACTION W/PHACO Right 02/13/2015   Procedure: CATARACT EXTRACTION PHACO AND INTRAOCULAR LENS PLACEMENT (IOC);  Surgeon:  Birder Robson, MD;  Location: [facility];  Service: Ophthalmology;  Laterality: Right;  cassette lot # [id number] H [place]  00:29.9 AP  20.7 CDE  6.20  . COLONOSCOPY N/A 11/02/2014   Procedure: COLONOSCOPY;  Surgeon: Inda Castle, MD;  Location: [facility];  Service: Endoscopy;  Laterality: N/A;  . CORONARY ANGIOPLASTY  2012   stent x 3   . CORONARY ANGIOPLASTY WITH STENT PLACEMENT  2013   Social History   Socioeconomic History  . Marital status: Married    Spouse name: Not on file  . Number of children: Not on file  . Years of education: Not on file  . Highest education level: Not on file  Occupational History  . Not on file  Social Needs  . Financial resource strain: Not on file  . Food insecurity:    Worry: Not on file    Inability: Not on file  . Transportation needs:    Medical: Not on file    Non-medical: Not on file  Tobacco Use  . Smoking status: Current Every Day Smoker    Packs/day: 1.00    Years: 45.00    Pack years: 45.00    Types: Cigarettes  . Smokeless tobacco: Never Used  . Tobacco comment: Has cut back, trying to quit.   Substance and Sexual Activity  . Alcohol use: No    Alcohol/week: 0.0 oz  . Drug use: Yes    Types: Marijuana    Comment: last night 4.13.16  . Sexual activity: Not on file  Lifestyle  . Physical activity:    Days per week: Not on file    Minutes per session: Not on file  . Stress: Not on file  Relationships  . Social connections:    Talks on phone: Not on file    Gets together: Not on file    Attends religious service: Not on file    Active member of club or organization: Not on file    Attends meetings of clubs or organizations: Not on file    Relationship status: Not on file  . Intimate partner violence:    Fear of current or ex partner: Not on file    Emotionally abused: Not on file    Physically abused: Not on file    Forced sexual activity: Not on file  Other Topics Concern  . Not on file  Social History Narrative    Lives at home with her husband in [place].  Previously used marijuana - quit.      Regular exercise: no/ pain from a frozen rotator cuff   Caffeine use: coffee daily and pepsi      Does not have a living will.   Daughters and husband know her wishes- would desire CPR but not prolonged life support if futile   Current Outpatient Medications on File Prior to Visit  Medication Sig Dispense Refill  . amitriptyline (ELAVIL) 50 MG tablet Take 1 tablet (50 mg total) by mouth at bedtime. 30 tablet 2  . aspirin 81 MG tablet Take 81 mg by mouth daily.      . Calcium & Magnesium Carbonates (MYLANTA PO) Take 15 mLs by mouth as needed (for indegestion).     . carvedilol (COREG) 3.125 MG tablet Take 2 tablets (6.25 mg total) by mouth 2 (two) times daily with a meal. 360 tablet 3  . clopidogrel (PLAVIX) 75 MG tablet TAKE 1 TABLET (75 MG TOTAL) BY MOUTH DAILY. 90 tablet 3  . cyclobenzaprine (FLEXERIL) 10 MG tablet Take 0.5-1 tablets (5-10 mg total) by mouth 3 (three) times daily as needed for muscle spasms. 30 tablet 0  . enalapril (VASOTEC) 20 MG tablet Take 1 tablet by mouth once daily for blood pressure. 90 tablet 0  . ezetimibe (ZETIA) 10 MG tablet Take 1 tablet (10 mg total) by mouth daily. 90 tablet 3  . glucose blood (FREESTYLE LITE) test strip USE 1 STRIP TO CHECK BLOOD SUGAR 5 TIMES DAILY FOR UNCONTROLLED DIABETES MELLITUS 400 each 5  . glucose monitoring kit (FREESTYLE) monitoring kit 1 each by Does not apply route as needed for other. PHARMACY PLEASE SUBSTITUTE WHATEVER GLUCOMETER YOU CARRY 1 each 1  . insulin aspart (NOVOLOG  FLEXPEN) 100 UNIT/ML FlexPen Inject 14-22 Units into the skin 3 (three) times daily with meals. Normally takes 12 units 15 pen 3  . Insulin Glargine (LANTUS SOLOSTAR) 100 UNIT/ML Solostar Pen Inject 40 Units into the skin at bedtime. 15 pen 3  . Insulin Pen Needle (INSUPEN PEN NEEDLES) 32G X 4 MM MISC Use to inject insulin 4 times daily. 250 each 1  . isosorbide mononitrate  (IMDUR) 30 MG 24 hr tablet Take 1 tablet (30 mg total) by mouth daily. 90 tablet 3  . levothyroxine (SYNTHROID, LEVOTHROID) 125 MCG tablet Take 1 tablet (125 mcg total) by mouth daily. 90 tablet 3  . meloxicam (MOBIC) 15 MG tablet Take 1 tablet (15 mg total) by mouth daily. 30 tablet 2  . ondansetron (ZOFRAN-ODT) 8 MG disintegrating tablet Take 1 tablet (8 mg total) by mouth every 8 (eight) hours as needed for nausea. 20 tablet 0  . pantoprazole (PROTONIX) 40 MG tablet Take 1 tablet (40 mg total) by mouth daily. 90 tablet 3  . rosuvastatin (CRESTOR) 40 MG tablet Take 1 tablet (40 mg total) by mouth daily. 90 tablet 3  . sitaGLIPtin (JANUVIA) 100 MG tablet Take 1 tablet (100 mg total) by mouth daily. 90 tablet 3  . traZODone (DESYREL) 100 MG tablet Take 1 tablet (100 mg total) by mouth at bedtime. 90 tablet 1   No current facility-administered medications on file prior to visit.    Allergies  Allergen Reactions  . No Known Allergies    Family History  Problem Relation Age of Onset  . Heart attack Mother        First MI @ 24 - Died @ 51  . Heart disease Mother   . Heart disease Father        Died @ 36  . Throat cancer Brother   . Liver cancer Brother   . Colon cancer Sister     PE: BP (!) 146/80   Pulse 70   Ht 5' 5"  (1.651 m)   Wt 147 lb 9.6 oz (67 kg)   SpO2 97%   BMI 24.56 kg/m  Body mass index is 24.56 kg/m. Wt Readings from Last 3 Encounters:  01/18/18 147 lb 9.6 oz (67 kg)  12/28/17 145 lb 4 oz (65.9 kg)  11/24/17 147 lb 4 oz (66.8 kg)   Constitutional: Normal weight, in NAD Eyes: PERRLA, EOMI, no exophthalmos ENT: moist mucous membranes, no thyromegaly, no cervical lymphadenopathy Cardiovascular: RRR, No MRG Respiratory: CTA B Gastrointestinal: abdomen soft, NT, ND, BS+ Musculoskeletal: no deformities, strength intact in all 4 Skin: moist, warm, no rashes Neurological: no tremor with outstretched hands, DTR normal in all 4  ASSESSMENT: 1. DM2,  insulin-dependent, uncontrolled, with complications - med noncompliance (dementia) - CAD - had 3 AMI in 2011 >> 7 stents placed; new AMI 2016/02/19 after her sister's death - sCHF - PAD - CKD - PN  2. Hypothyroidism - Uncontrolled  3. HL  PLAN:  1. Patient with long-standing, uncontrolled, type 2 diabetes, with history of medication and visit noncompliance.  We again discussed about the importance of taking all the NovoLog doses as prescribed, before every meal, at last visit.  The sugars were a little better especially in the morning, but increasing during the day.  She was still not taking the entire dose of NovoLog recommended.  We increased her NovoLog doses then. - At this visit, she is missing less doses, but still misses a significant amount of NovoLog, especially when she is  out as she does not take her NovoLog pen with her.  We discussed about how to do so and the fact that she should not miss any doses.  I am afraid that these high postprandial CBGs that she gets after missing her insulin injections will skew her HbA1c towards higher numbers. - I suggested to:  Patient Instructions  Please continue: - Lantus 40 units at bedtime - Januvia 100 mg in am, before b'fast - NovoLog 14 units before a smaller meal 15 units before a larger meal or if you have dessert  If sugars before the meal are: 70 or lower, do not take insulin before that meal 71- 99, take no more than 10 units with that meal 100 or above, take the whole dose of insulin  Do not skip any insulin doses.  Please return in 3 months with your sugar log.    - today, HbA1c is 9.2% (better, but still high) - continue checking sugars at different times of the day - check 3x a day, rotating checks - advised for yearly eye exams >> she is not UTD - Return to clinic in 3 mo with sugar log     2. Hypothyroidism - Patient with very uncontrolled hypothyroidism due to poor medication compliance - latest thyroid labs  reviewed with pt >> TSH again high 10/2017 while checked by PCP.  Levothyroxine dose was not changed then as she was missing doses - she continues on LT4 125 mcg daily - pt feels good on this dose. - we discussed about taking the thyroid hormone every day, with water, >30 minutes before breakfast, separated by >4 hours from acid reflux medications, calcium, iron, multivitamins. Pt. is taking it correctly. - will check thyroid tests today: TSH and fT4 - If labs are abnormal, she will need to return for repeat TFTs in 1.5 months  3. HL - Reviewed latest lipid panel in 10/2017 >> LDL higher (missing meds) - Continues crestor and zetia without side effects.   Philemon Kingdom, MD PhD [facility]

## 2018-01-19 ENCOUNTER — Ambulatory Visit: Payer: Medicare Other | Attending: Nurse Practitioner | Admitting: Nurse Practitioner

## 2018-01-19 ENCOUNTER — Ambulatory Visit
Admission: RE | Admit: 2018-01-19 | Discharge: 2018-01-19 | Disposition: A | Discharge status: Home / Self Care | Payer: Medicare Other | Source: Ambulatory Visit | Attending: Nurse Practitioner | Admitting: Nurse Practitioner

## 2018-01-19 ENCOUNTER — Encounter: Payer: Self-pay | Admitting: Nurse Practitioner

## 2018-01-19 ENCOUNTER — Other Ambulatory Visit: Payer: Self-pay

## 2018-01-19 VITALS — BP 180/100 | HR 76 | Temp 98.2°F | Ht 65.0 in | Wt 147.0 lb

## 2018-01-19 DIAGNOSIS — M542 Cervicalgia: Secondary | ICD-10-CM | POA: Diagnosis not present

## 2018-01-19 DIAGNOSIS — Z5181 Encounter for therapeutic drug level monitoring: Secondary | ICD-10-CM | POA: Diagnosis not present

## 2018-01-19 DIAGNOSIS — M25511 Pain in right shoulder: Secondary | ICD-10-CM | POA: Insufficient documentation

## 2018-01-19 DIAGNOSIS — E039 Hypothyroidism, unspecified: Secondary | ICD-10-CM | POA: Diagnosis not present

## 2018-01-19 DIAGNOSIS — F329 Major depressive disorder, single episode, unspecified: Secondary | ICD-10-CM | POA: Diagnosis not present

## 2018-01-19 DIAGNOSIS — Z800 Family history of malignant neoplasm of digestive organs: Secondary | ICD-10-CM | POA: Insufficient documentation

## 2018-01-19 DIAGNOSIS — M19011 Primary osteoarthritis, right shoulder: Secondary | ICD-10-CM | POA: Diagnosis not present

## 2018-01-19 DIAGNOSIS — Z955 Presence of coronary angioplasty implant and graft: Secondary | ICD-10-CM | POA: Insufficient documentation

## 2018-01-19 DIAGNOSIS — Z9889 Other specified postprocedural states: Secondary | ICD-10-CM | POA: Insufficient documentation

## 2018-01-19 DIAGNOSIS — Z9841 Cataract extraction status, right eye: Secondary | ICD-10-CM | POA: Insufficient documentation

## 2018-01-19 DIAGNOSIS — I509 Heart failure, unspecified: Secondary | ICD-10-CM | POA: Diagnosis not present

## 2018-01-19 DIAGNOSIS — Z794 Long term (current) use of insulin: Secondary | ICD-10-CM | POA: Insufficient documentation

## 2018-01-19 DIAGNOSIS — Z9119 Patient's noncompliance with other medical treatment and regimen: Secondary | ICD-10-CM | POA: Insufficient documentation

## 2018-01-19 DIAGNOSIS — D126 Benign neoplasm of colon, unspecified: Secondary | ICD-10-CM | POA: Diagnosis not present

## 2018-01-19 DIAGNOSIS — I10 Essential (primary) hypertension: Secondary | ICD-10-CM | POA: Diagnosis not present

## 2018-01-19 DIAGNOSIS — M5030 Other cervical disc degeneration, unspecified cervical region: Secondary | ICD-10-CM | POA: Diagnosis not present

## 2018-01-19 DIAGNOSIS — F1210 Cannabis abuse, uncomplicated: Secondary | ICD-10-CM | POA: Insufficient documentation

## 2018-01-19 DIAGNOSIS — G4733 Obstructive sleep apnea (adult) (pediatric): Secondary | ICD-10-CM | POA: Insufficient documentation

## 2018-01-19 DIAGNOSIS — Z7902 Long term (current) use of antithrombotics/antiplatelets: Secondary | ICD-10-CM | POA: Insufficient documentation

## 2018-01-19 DIAGNOSIS — E1165 Type 2 diabetes mellitus with hyperglycemia: Secondary | ICD-10-CM | POA: Insufficient documentation

## 2018-01-19 DIAGNOSIS — J449 Chronic obstructive pulmonary disease, unspecified: Secondary | ICD-10-CM | POA: Insufficient documentation

## 2018-01-19 DIAGNOSIS — K5730 Diverticulosis of large intestine without perforation or abscess without bleeding: Secondary | ICD-10-CM | POA: Insufficient documentation

## 2018-01-19 DIAGNOSIS — E1136 Type 2 diabetes mellitus with diabetic cataract: Secondary | ICD-10-CM | POA: Insufficient documentation

## 2018-01-19 DIAGNOSIS — L900 Lichen sclerosus et atrophicus: Secondary | ICD-10-CM | POA: Insufficient documentation

## 2018-01-19 DIAGNOSIS — E782 Mixed hyperlipidemia: Secondary | ICD-10-CM | POA: Diagnosis not present

## 2018-01-19 DIAGNOSIS — E1149 Type 2 diabetes mellitus with other diabetic neurological complication: Secondary | ICD-10-CM | POA: Diagnosis not present

## 2018-01-19 DIAGNOSIS — I2510 Atherosclerotic heart disease of native coronary artery without angina pectoris: Secondary | ICD-10-CM | POA: Insufficient documentation

## 2018-01-19 DIAGNOSIS — I493 Ventricular premature depolarization: Secondary | ICD-10-CM | POA: Insufficient documentation

## 2018-01-19 DIAGNOSIS — G894 Chronic pain syndrome: Secondary | ICD-10-CM | POA: Diagnosis not present

## 2018-01-19 DIAGNOSIS — I110 Hypertensive heart disease with heart failure: Secondary | ICD-10-CM | POA: Insufficient documentation

## 2018-01-19 DIAGNOSIS — M4716 Other spondylosis with myelopathy, lumbar region: Secondary | ICD-10-CM | POA: Insufficient documentation

## 2018-01-19 DIAGNOSIS — F17210 Nicotine dependence, cigarettes, uncomplicated: Secondary | ICD-10-CM | POA: Insufficient documentation

## 2018-01-19 DIAGNOSIS — G4700 Insomnia, unspecified: Secondary | ICD-10-CM | POA: Diagnosis not present

## 2018-01-19 DIAGNOSIS — M50323 Other cervical disc degeneration at C6-C7 level: Secondary | ICD-10-CM | POA: Diagnosis not present

## 2018-01-19 DIAGNOSIS — Z791 Long term (current) use of non-steroidal anti-inflammatories (NSAID): Secondary | ICD-10-CM | POA: Insufficient documentation

## 2018-01-19 DIAGNOSIS — Z79899 Other long term (current) drug therapy: Secondary | ICD-10-CM | POA: Insufficient documentation

## 2018-01-19 DIAGNOSIS — Z8249 Family history of ischemic heart disease and other diseases of the circulatory system: Secondary | ICD-10-CM | POA: Insufficient documentation

## 2018-01-19 DIAGNOSIS — Z7982 Long term (current) use of aspirin: Secondary | ICD-10-CM | POA: Insufficient documentation

## 2018-01-19 MED ORDER — MELOXICAM 15 MG PO TABS: 15.0000 mg | ORAL_TABLET | Freq: Every day | ORAL | 2 refills | Status: DC

## 2018-01-19 MED ORDER — CYCLOBENZAPRINE HCL 5 MG PO TABS: 5.0000 mg | ORAL_TABLET | Freq: Three times a day (TID) | ORAL | 0 refills | Status: DC | PRN

## 2018-01-19 MED ORDER — AMITRIPTYLINE HCL 50 MG PO TABS: 50.0000 mg | ORAL_TABLET | Freq: Every day | ORAL | 2 refills | Status: DC

## 2018-01-19 NOTE — Patient Instructions (Addendum)
  ____________________________________________________________________________________________  Appointment Policy Summary  It is our goal and responsibility to provide the medical community with assistance in the evaluation and management of patients with chronic pain. Unfortunately our resources are limited. Because we do not have an unlimited amount of time, or available appointments, we are required to closely monitor and manage their use. The following rules exist to maximize their use:  Patient's responsibilities: 1. Punctuality:  At what time should I arrive? You should be physically present in our office 30 minutes before your scheduled appointment. Your scheduled appointment is with your assigned healthcare provider. However, it takes 5-10 minutes to be "checked-in", and another 15 minutes for the nurses to do the admission. If you arrive to our office at the time you were given for your appointment, you will end up being at least 20-25 minutes late to your appointment with the provider. 2. Tardiness:  What happens if I arrive only a few minutes after my scheduled appointment time? You will need to reschedule your appointment. The cutoff is your appointment time. This is why it is so important that you arrive at least 30 minutes before that appointment. If you have an appointment scheduled for 10:00 AM and you arrive at 10:01, you will be required to reschedule your appointment.  3. Plan ahead:  Always assume that you will encounter traffic on your way in. Plan for it. If you are dependent on a driver, make sure they understand these rules and the need to arrive early. 4. Other appointments and responsibilities:  Avoid scheduling any other appointments before or after your pain clinic appointments.  5. Be prepared:  Write down everything that you need to discuss with your healthcare provider and give this information to the admitting nurse. Write down the medications that you will need  refilled. Bring your pills and bottles (even the empty ones), to all of your appointments, except for those where a procedure is scheduled. 6. No children or pets:  Find someone to take care of them. It is not appropriate to bring them in. 7. Scheduling changes:  We request "advanced notification" of any changes or cancellations. 8. Advanced notification:  Defined as a time period of more than 24 hours prior to the originally scheduled appointment. This allows for the appointment to be offered to other patients. 9. Rescheduling:  When a visit is rescheduled, it will require the cancellation of the original appointment. For this reason they both fall within the category of "Cancellations".  10. Cancellations:  They require advanced notification. Any cancellation less than 24 hours before the  appointment will be recorded as a "No Show". 11. No Show:  Defined as an unkept appointment where the patient failed to notify or declare to the practice their intention or inability to keep the appointment.  Corrective process for repeat offenders:  1. Tardiness: Three (3) episodes of rescheduling due to late arrivals will be recorded as one (1) "No Show". 2. Cancellation or reschedule: Three (3) cancellations or rescheduling will be recorded as one (1) "No Show". 3. "No Shows": Three (3) "No Shows" within a 12 month period will result in discharge from the practice.  ____________________________________________________________________________________________   

## 2018-01-19 NOTE — Progress Notes (Signed)
Patient's Name: Sarah Payne  MRN: [id number]  Referring Provider: [name], NP  DOB: 06-21-1946  PCP: [name], NP  DOS: 01/19/2018  Note by: Vevelyn Francois NP  Service setting: Ambulatory outpatient  Specialty: Interventional Pain Management  Location: [facility] (AMB) Pain Management Facility    Patient type: Established    Primary Reason(s) for Visit: Encounter for prescription drug management. (Level of risk: moderate)  CC: Arm Pain (right)  HPI  Sarah Payne is a 72 y.o. year old, female patient, who comes today for a medication management evaluation. She has CAD, multiple vessel; Tobacco abuse; Mixed hyperlipidemia; Hypothyroidism; Depression; History of lumbar fusion; Type II diabetes mellitus with neurological manifestations ([place]); Lichen sclerosus et atrophicus; Bronchitis, chronic obstructive ([place]); Chronic systolic congestive heart failure ([place]); Obstructive sleep apnea; Noncompliance with diabetes treatment; COPD, moderate ([place]); Diverticulosis of colon without hemorrhage; Esophageal reflux; Adjustment disorder with mixed anxiety and depressed mood; Benign neoplasm of colon; Internal hemorrhoids; HTN (hypertension); Acute systolic (congestive) heart failure ([place]); Takotsubo cardiomyopathy; Frequent PVCs; Insomnia; Chronic bilateral back pain; Lumbar degenerative disc disease; Lumbar spondylosis with myelopathy; Cannabis use disorder, mild, abuse; Cognitive changes; Peripheral polyneuropathy; Tobacco dependence; Type 2 diabetes mellitus, uncontrolled ([place]); Neck pain; and Right shoulder pain on their problem list. Her primarily concern today is the Arm Pain (right)  Pain Assessment: Location: Right Arm Radiating: Radiates down arm to arm becomes Numb down to three finger next to thumb Onset: More than a month ago Duration: Chronic pain Quality: Numbness, Aching, Burning, Throbbing, Constant, Discomfort Severity: 7 /10 (subjective, self-reported pain score)  Note: Reported  level is compatible with observation. Clinically the patient looks like a 2/10 A 2/10 is viewed as "Mild to Moderate" and described as noticeable and distracting. Impossible to hide from other people. More frequent flare-ups. Still possible to adapt and function close to normal. It can be very annoying and may have occasional stronger flare-ups. With discipline, patients may get used to it and adapt. Information on the proper use of the pain scale provided to the patient today. When using our objective Pain Scale, levels between 6 and 10/10 are said to belong in an emergency room, as it progressively worsens from a 6/10, described as severely limiting, requiring emergency care not usually available at an outpatient pain management facility. At a 6/10 level, communication becomes difficult and requires great effort. Assistance to reach the emergency department may be required. Facial flushing and profuse sweating along with potentially dangerous increases in heart rate and blood pressure will be evident. Effect on ADL: limits daily activities Timing: Constant Modifying factors: lay down, meds  BP: (!) 180/100  HR: 76  Sarah Payne was last scheduled for an appointment on Visit date not found for medication management. During today's appointment we reviewed [name]'s chronic pain status, as well as her outpatient medication regimen. She admits that her back pain is doing well. She states that she is having right arm pain. She states that she is the primary care giver for her husband secondary to his stroke. She denies doing anything that may have caused this pain. She felt like it worse after waking up on morning a week or so ago. She admits there is numbness and weakness. She has the numbness in her right hand fingers 1-3. She admits that she did have left rotator cuff repair in the past. She admits that she did have PT after her rotator cuff surgery.   The patient  reports that she has  current or past drug  history. Drug: Marijuana. Her body mass index is 24.46 kg/m.  Further details on both, my assessment(s), as well as the proposed treatment plan, please see below.   Laboratory Chemistry  Inflammation Markers (CRP: Acute Phase) (ESR: Chronic Phase) Lab Results  Component Value Date   CRP 2.1 04/11/2013   ESRSEDRATE 58 (H) 04/11/2013                         Rheumatology Markers No results found for: RF, ANA, LABURIC, URICUR, LYMEIGGIGMAB, LYMEABIGMQN, HLAB27                      Renal Function Markers Lab Results  Component Value Date   BUN 12 11/17/2017   CREATININE 0.99 11/17/2017   BCR 19 08/11/2012   GFRAA >60 01/09/2016   GFRNONAA >60 01/09/2016                             Hepatic Function Markers Lab Results  Component Value Date   AST 14 11/17/2017   ALT 12 11/17/2017   ALBUMIN 3.9 11/17/2017   ALKPHOS 76 11/17/2017   HCVAB NEGATIVE 03/12/2015   AMYLASE 32 04/11/2016   LIPASE 55.0 04/11/2016                        Electrolytes Lab Results  Component Value Date   NA 141 11/17/2017   K 3.6 11/17/2017   CL 102 11/17/2017   CALCIUM 9.3 11/17/2017   MG 1.1 (L) 06/12/2014                        Neuropathy Markers Lab Results  Component Value Date   VITAMINB12 542 10/13/2011   HGBA1C 9.2 (A) 01/18/2018                        Bone Pathology Markers Lab Results  Component Value Date   VD25OH 41 02/25/2013                         Coagulation Parameters Lab Results  Component Value Date   INR 1.01 01/09/2016   LABPROT 13.5 01/09/2016   APTT 109 (H) 01/01/2016   PLT 289.0 06/02/2016                        Cardiovascular Markers Lab Results  Component Value Date   BNP 91 08/01/2014   CKTOTAL 272 (H) 08/01/2014   CKMB 4.5 (H) 08/01/2014   TROPONINI <0.03 01/09/2016   HGB 14.7 06/02/2016   HCT 43.7 06/02/2016                         CA Markers No results found for: CEA, CA125, LABCA2                      Note: Lab results reviewed.  Recent  Diagnostic Imaging Results  DG Bone Density EXAM: DUAL X-RAY ABSORPTIOMETRY (DXA) FOR BONE MINERAL DENSITY  IMPRESSION: Dear Dr. Carlis Abbott,  Your patient Sarah Payne completed a BMD test on 01/05/2018 using the [place] (analysis version: 14.10) manufactured by [employer]. The following summarizes the results of our evaluation.  PATIENT BIOGRAPHICAL: Name: Sonni, Barse Patient ID:  [id number] Birth Date: 12/12/1945 Height: 65.0 in. Gender: Female Exam Date: 01/05/2018 Weight: 146.0 lbs. Indications: COPD, Diabetic, Hypothyroid Fractures: Treatments: Synthroid  ASSESSMENT: The BMD measured at Femur Neck Left is 0.930 g/cm2 with a T-score of -0.8. This patient is considered normal according to [facility]) criteria. Lumbar spine was not utilized due to surgical hardware.  Site Region Measured Measured WHO Young Adult BMD Date       Age      Classification T-score DualFemur Neck Left 01/05/2018 69.8 Normal -0.8 0.930 g/cm2 DualFemur Neck Left 05/16/2009 61.2 Normal 0.0 1.035 g/cm2  DualFemur Total Mean 01/05/2018 69.8 Normal -0.3 0.972 g/cm2 DualFemur Total Mean 05/16/2009 61.2 Normal 0.4 1.054 g/cm2  Left Forearm Radius 33% 01/05/2018 69.8 Normal -0.1 0.868 g/cm2 Left Forearm Radius 33% 05/16/2009 61.2 Normal 0.0 0.879 g/cm2  World Health Organization [facility]) criteria for post-menopausal, Caucasian Women: Normal:       T-score at or above -1 SD Osteopenia:   T-score between -1 and -2.5 SD Osteoporosis: T-score at or below -2.5 SD  RECOMMENDATIONS: 1. All patients should optimize calcium and vitamin D intake. 2. Consider FDA-approved medical therapies in postmenopausal women and men aged 86 years and older, based on the following: a. A hip or vertebral(clinical or morphometric) fracture b. T-score < -2.5 at the femoral neck or spine after appropriate evaluation to exclude secondary causes c. Low bone mass (T-score between -1.0 and -2.5 at  the femoral neck or spine) and a 10-year probability of a hip fracture > 3% or a 10-year probability of a major osteoporosis-related fracture > 20% based on the [place]-adapted WHO algorithm d. Clinician judgment and/or patient preferences may indicate treatment for people with 10-year fracture probabilities above or below these levels  FOLLOW-UP: People with diagnosed cases of osteoporosis or at high risk for fracture should have regular bone mineral density tests. For patients eligible for Medicare, routine testing is allowed once every 2 years. The testing frequency can be increased to one year for patients who have rapidly progressing disease, those who are receiving or discontinuing medical therapy to restore bone mass, or have additional risk factors.  I have reviewed this report, and agree with the above findings.  [facility] Radiology  Electronically Signed   By: Lowella Grip III M.D.   On: 01/05/2018 13:26  Complexity Note: Imaging results reviewed. Results shared with Ms. Owens Shark, using Layman's terms.                         Meds   Current Outpatient Medications:  .  aspirin 81 MG tablet, Take 81 mg by mouth daily.  , Disp: , Rfl:  .  carvedilol (COREG) 3.125 MG tablet, Take 2 tablets (6.25 mg total) by mouth 2 (two) times daily with a meal., Disp: 360 tablet, Rfl: 3 .  enalapril (VASOTEC) 20 MG tablet, Take 1 tablet by mouth once daily for blood pressure., Disp: 90 tablet, Rfl: 0 .  ezetimibe (ZETIA) 10 MG tablet, Take 1 tablet (10 mg total) by mouth daily., Disp: 90 tablet, Rfl: 3 .  glucose blood (FREESTYLE LITE) test strip, USE 1 STRIP TO CHECK BLOOD SUGAR 5 TIMES DAILY FOR UNCONTROLLED DIABETES MELLITUS, Disp: 400 each, Rfl: 5 .  glucose monitoring kit (FREESTYLE) monitoring kit, 1 each by Does not apply route as needed for other. PHARMACY PLEASE SUBSTITUTE WHATEVER GLUCOMETER YOU CARRY, Disp: 1 each, Rfl: 1 .  insulin aspart (NOVOLOG FLEXPEN) 100 UNIT/ML FlexPen,  Inject  14-22 Units into the skin 3 (three) times daily with meals. Normally takes 12 units, Disp: 15 pen, Rfl: 3 .  Insulin Glargine (LANTUS SOLOSTAR) 100 UNIT/ML Solostar Pen, Inject 40 Units into the skin at bedtime., Disp: 15 pen, Rfl: 3 .  Insulin Pen Needle (INSUPEN PEN NEEDLES) 32G X 4 MM MISC, Use to inject insulin 4 times daily., Disp: 250 each, Rfl: 1 .  isosorbide mononitrate (IMDUR) 30 MG 24 hr tablet, Take 1 tablet (30 mg total) by mouth daily., Disp: 90 tablet, Rfl: 3 .  levothyroxine (SYNTHROID, LEVOTHROID) 125 MCG tablet, Take 1 tablet (125 mcg total) by mouth daily., Disp: 90 tablet, Rfl: 3 .  ondansetron (ZOFRAN-ODT) 8 MG disintegrating tablet, Take 1 tablet (8 mg total) by mouth every 8 (eight) hours as needed for nausea., Disp: 20 tablet, Rfl: 0 .  pantoprazole (PROTONIX) 40 MG tablet, Take 1 tablet (40 mg total) by mouth daily., Disp: 90 tablet, Rfl: 3 .  rosuvastatin (CRESTOR) 40 MG tablet, Take 1 tablet (40 mg total) by mouth daily., Disp: 90 tablet, Rfl: 3 .  sitaGLIPtin (JANUVIA) 100 MG tablet, Take 1 tablet (100 mg total) by mouth daily., Disp: 90 tablet, Rfl: 3 .  traZODone (DESYREL) 100 MG tablet, Take 1 tablet (100 mg total) by mouth at bedtime., Disp: 90 tablet, Rfl: 1 .  amitriptyline (ELAVIL) 50 MG tablet, Take 1 tablet (50 mg total) by mouth at bedtime., Disp: 30 tablet, Rfl: 2 .  cyclobenzaprine (FLEXERIL) 5 MG tablet, Take 1 tablet (5 mg total) by mouth 3 (three) times daily as needed for muscle spasms., Disp: 90 tablet, Rfl: 0 .  meloxicam (MOBIC) 15 MG tablet, Take 1 tablet (15 mg total) by mouth daily., Disp: 30 tablet, Rfl: 2  ROS  Constitutional: Denies any fever or chills Gastrointestinal: No reported hemesis, hematochezia, vomiting, or acute GI distress Musculoskeletal: Denies any acute onset joint swelling, redness, loss of ROM, or weakness Neurological: No reported episodes of acute onset apraxia, aphasia, dysarthria, agnosia, amnesia, paralysis, loss of  coordination, or loss of consciousness  Allergies  Ms. Claycomb is allergic to no known allergies.  [place]  Drug: Ms. Wiederholt  reports that she has current or past drug history. Drug: Marijuana. Alcohol:  reports that she does not drink alcohol. Tobacco:  reports that she has been smoking cigarettes.  She has a 45.00 pack-year smoking history. She has never used smokeless tobacco. Medical:  has a past medical history of Cataract, Chronic low back pain, COPD (chronic obstructive pulmonary disease) ([place]), Coronary artery disease, Depression, Diabetes mellitus, Diverticulosis, Frequent PVCs, GERD (gastroesophageal reflux disease), Hyperlipidemia, Hypertension, Hypothyroidism, Impingement syndrome of left shoulder, Marijuana abuse, Myocardial infarction [facility]), Sleep apnea, Takotsubo cardiomyopathy, Tendonitis of left rotator cuff, Tobacco abuse, and Vascular dementia. Surgical: Ms. Quaranta  has a past surgical history that includes Back surgery; Appendectomy; Abdominal hysterectomy; Colonoscopy (N/A, 11/02/2014); Cataract extraction w/PHACO (Left, 01/30/2015); Cardiac catheterization (2013); Coronary angioplasty (2012); Coronary angioplasty with stent (2013); Cardiac catheterization (2016); Cataract extraction w/PHACO (Right, 02/13/2015); and Cardiac catheterization (N/A, 01/01/2016). Family: family history includes Colon cancer in her sister; Heart attack in her mother; Heart disease in her father and mother; Liver cancer in her brother; Throat cancer in her brother.  Constitutional Exam  General appearance: Well nourished, well developed, and well hydrated. In no apparent acute distress Vitals:   01/19/18 0837  BP: (!) 180/100  Pulse: 76  Temp: 98.2 F (36.8 C)  SpO2: 100%  Weight: 147 lb (66.7 kg)  Height: 5'  5" (1.651 m)   BMI Assessment: Estimated body mass index is 24.46 kg/m as calculated from the following:   Height as of this encounter: 5' 5"  (1.651 m).   Weight as of this encounter: 147 lb (66.7  kg). Psych/Mental status: Alert, oriented x 3 (person, place, & time)       Eyes: PERLA Respiratory: No evidence of acute respiratory distress  Cervical Spine Area Exam  Skin & Axial Inspection: No masses, redness, edema, swelling, or associated skin lesions Alignment: Symmetrical Functional ROM: Unrestricted ROM      painful with right rotation Stability: No instability detected Muscle Tone/Strength: Functionally intact. No obvious neuro-muscular anomalies detected. Sensory (Neurological): Unimpaired Palpation: Complains of area being tender to palpation Cervical compression test positive      on the right  Upper Extremity (UE) Exam    Side: Right upper extremity  Side: Left upper extremity  Skin & Extremity Inspection: Skin color, temperature, and hair growth are WNL. No peripheral edema or cyanosis. No masses, redness, swelling, asymmetry, or associated skin lesions. No contractures.  Skin & Extremity Inspection: Skin color, temperature, and hair growth are WNL. No peripheral edema or cyanosis. No masses, redness, swelling, asymmetry, or associated skin lesions. No contractures.  Functional ROM: Pain restricted ROM for shoulder  Functional ROM: Unrestricted ROM          Muscle Tone/Strength: Functionally intact. No obvious neuro-muscular anomalies detected.  Muscle Tone/Strength: Functionally intact. No obvious neuro-muscular anomalies detected.  Sensory (Neurological): Unimpaired          Sensory (Neurological): Unimpaired          Palpation: Tender              Palpation: No palpable anomalies              Provocative Test(s):  Phalen's test: deferred Tinel's test: deferred Apley's scratch test (touch opposite shoulder):  Action 1 (Across chest): deferred Action 2 (Overhead): Decreased ROM Action 3 (LB reach): Decreased ROM   Provocative Test(s):  Phalen's test: deferred Tinel's test: deferred Apley's scratch test (touch opposite shoulder):  Action 1 (Across chest):  deferred Action 2 (Overhead): deferred Action 3 (LB reach): deferred    Gait & Posture Assessment  Ambulation: Unassisted Gait: Relatively normal for age and body habitus Posture: WNL    Assessment  Primary Diagnosis & Pertinent Problem List: The primary encounter diagnosis was Neck pain. Diagnoses of Right shoulder pain, unspecified chronicity and Lumbar spondylosis with myelopathy were also pertinent to this visit.  Status Diagnosis  Worsening Worsening Controlled 1. Neck pain   2. Right shoulder pain, unspecified chronicity   3. Lumbar spondylosis with myelopathy     Problems updated and reviewed during this visit: Problem  Neck Pain  Right Shoulder Pain  Cannabis Use Disorder, Mild, Abuse  Cognitive Changes  Type 2 Diabetes Mellitus, Uncontrolled ([facility])  Peripheral Polyneuropathy  Tobacco Dependence  Type II Diabetes Mellitus With Neurological Manifestations ([facility])   ABIs done by [facility] Aug 2014 1.22 right   1.23 left    Mixed Hyperlipidemia   Plan of Care  Pharmacotherapy (Medications Ordered): Meds ordered this encounter  Medications  . amitriptyline (ELAVIL) 50 MG tablet    Sig: Take 1 tablet (50 mg total) by mouth at bedtime.    Dispense:  30 tablet    Refill:  2    Order Specific Question:   Supervising Provider    Answer:   Milinda Pointer [phone]  . meloxicam (MOBIC) 15 MG tablet  Sig: Take 1 tablet (15 mg total) by mouth daily.    Dispense:  30 tablet    Refill:  2    Order Specific Question:   Supervising Provider    Answer:   Milinda Pointer [phone]  . cyclobenzaprine (FLEXERIL) 5 MG tablet    Sig: Take 1 tablet (5 mg total) by mouth 3 (three) times daily as needed for muscle spasms.    Dispense:  90 tablet    Refill:  0    Do not place this medication, or any other prescription from our practice, on "Automatic Refill". Patient may have prescription filled one day early if pharmacy is closed on scheduled refill date.    Order Specific  Question:   Supervising Provider    Answer:   Milinda Pointer [phone]   New Prescriptions   CYCLOBENZAPRINE (FLEXERIL) 5 MG TABLET    Take 1 tablet (5 mg total) by mouth 3 (three) times daily as needed for muscle spasms.   Medications administered today: Megan Salon. [place] had no medications administered during this visit. Lab-work, procedure(s), and/or referral(s): Orders Placed This Encounter  Procedures  . DG Cervical Spine With Flex & Extend  . DG Shoulder Right   Imaging and/or referral(s): None  Interventional therapies: Planned, scheduled, and/or pending:   Not at this time.    Provider-requested follow-up: Return in about 3 months (around 04/21/2018) for MedMgmt with Me Donella Stade Edison Pace).  Future Appointments  Date Time Provider [place]  04/21/2018  9:00 AM Vevelyn Francois, NP [facility] None  05/04/2018 10:15 AM Philemon Kingdom, MD [facility] None  11/24/2018 10:00 AM Eustace Pen, LPN [facility]  11/21/6501  2:20 PM [name], NP [facility]   Primary Care Physician: [name], NP Location: [facility] Management Facility Note by: Vevelyn Francois NP Date: 01/19/2018; Time: 12:51 PM  Pain Score Disclaimer: We use the NRS-11 scale. This is a self-reported, subjective measurement of pain severity with only modest accuracy. It is used primarily to identify changes within a particular patient. It must be understood that outpatient pain scales are significantly less accurate that those used for research, where they can be applied under ideal controlled circumstances with minimal exposure to variables. In reality, the score is likely to be a combination of pain intensity and pain affect, where pain affect describes the degree of emotional arousal or changes in action readiness caused by the sensory experience of pain. Factors such as social and work situation, setting, emotional state, anxiety levels, expectation, and prior pain experience  may influence pain perception and show large inter-individual differences that may also be affected by time variables.  Patient instructions provided during this appointment: Patient Instructions  ____________________________________________________________________________________________  Appointment Policy Summary  It is our goal and responsibility to provide the medical community with assistance in the evaluation and management of patients with chronic pain. Unfortunately our resources are limited. Because we do not have an unlimited amount of time, or available appointments, we are required to closely monitor and manage their use. The following rules exist to maximize their use:  Patient's responsibilities: 1. Punctuality:  At what time should I arrive? You should be physically present in our office 30 minutes before your scheduled appointment. Your scheduled appointment is with your assigned healthcare provider. However, it takes 5-10 minutes to be "checked-in", and another 15 minutes for the nurses to do the admission. If you arrive to our office at the time you were given for your appointment,  you will end up being at least 20-25 minutes late to your appointment with the provider. 2. Tardiness:  What happens if I arrive only a few minutes after my scheduled appointment time? You will need to reschedule your appointment. The cutoff is your appointment time. This is why it is so important that you arrive at least 30 minutes before that appointment. If you have an appointment scheduled for 10:00 AM and you arrive at 10:01, you will be required to reschedule your appointment.  3. Plan ahead:  Always assume that you will encounter traffic on your way in. Plan for it. If you are dependent on a driver, make sure they understand these rules and the need to arrive early. 4. Other appointments and responsibilities:  Avoid scheduling any other appointments before or after your pain clinic appointments.   5. Be prepared:  Write down everything that you need to discuss with your healthcare provider and give this information to the admitting nurse. Write down the medications that you will need refilled. Bring your pills and bottles (even the empty ones), to all of your appointments, except for those where a procedure is scheduled. 6. No children or pets:  Find someone to take care of them. It is not appropriate to bring them in. 7. Scheduling changes:  We request "advanced notification" of any changes or cancellations. 8. Advanced notification:  Defined as a time period of more than 24 hours prior to the originally scheduled appointment. This allows for the appointment to be offered to other patients. 9. Rescheduling:  When a visit is rescheduled, it will require the cancellation of the original appointment. For this reason they both fall within the category of "Cancellations".  10. Cancellations:  They require advanced notification. Any cancellation less than 24 hours before the  appointment will be recorded as a "No Show". 11. No Show:  Defined as an unkept appointment where the patient failed to notify or declare to the practice their intention or inability to keep the appointment.  Corrective process for repeat offenders:  1. Tardiness: Three (3) episodes of rescheduling due to late arrivals will be recorded as one (1) "No Show". 2. Cancellation or reschedule: Three (3) cancellations or rescheduling will be recorded as one (1) "No Show". 3. "No Shows": Three (3) "No Shows" within a 12 month period will result in discharge from the practice. ____________________________________________________________________________________________  .

## 2018-01-20 NOTE — Progress Notes (Signed)
Results were reviewed and found to be: mildly abnormal  No acute injury or pathology identified  Review would suggest interventional pain management techniques may be of benefit 

## 2018-01-22 ENCOUNTER — Encounter: Payer: Self-pay | Admitting: Emergency Medicine

## 2018-01-22 ENCOUNTER — Emergency Department
Admission: EM | Admit: 2018-01-22 | Discharge: 2018-01-22 | Disposition: A | Discharge status: Home / Self Care | Payer: Medicare Other | Attending: Emergency Medicine | Admitting: Emergency Medicine

## 2018-01-22 ENCOUNTER — Other Ambulatory Visit: Payer: Self-pay

## 2018-01-22 DIAGNOSIS — I110 Hypertensive heart disease with heart failure: Secondary | ICD-10-CM | POA: Insufficient documentation

## 2018-01-22 DIAGNOSIS — E039 Hypothyroidism, unspecified: Secondary | ICD-10-CM | POA: Insufficient documentation

## 2018-01-22 DIAGNOSIS — J449 Chronic obstructive pulmonary disease, unspecified: Secondary | ICD-10-CM | POA: Diagnosis not present

## 2018-01-22 DIAGNOSIS — M5030 Other cervical disc degeneration, unspecified cervical region: Secondary | ICD-10-CM | POA: Diagnosis not present

## 2018-01-22 DIAGNOSIS — Z7982 Long term (current) use of aspirin: Secondary | ICD-10-CM | POA: Diagnosis not present

## 2018-01-22 DIAGNOSIS — F0150 Vascular dementia without behavioral disturbance: Secondary | ICD-10-CM | POA: Insufficient documentation

## 2018-01-22 DIAGNOSIS — M5412 Radiculopathy, cervical region: Secondary | ICD-10-CM

## 2018-01-22 DIAGNOSIS — I2510 Atherosclerotic heart disease of native coronary artery without angina pectoris: Secondary | ICD-10-CM | POA: Diagnosis not present

## 2018-01-22 DIAGNOSIS — F17210 Nicotine dependence, cigarettes, uncomplicated: Secondary | ICD-10-CM | POA: Diagnosis not present

## 2018-01-22 DIAGNOSIS — I252 Old myocardial infarction: Secondary | ICD-10-CM | POA: Insufficient documentation

## 2018-01-22 DIAGNOSIS — M5010 Cervical disc disorder with radiculopathy, unspecified cervical region: Secondary | ICD-10-CM | POA: Insufficient documentation

## 2018-01-22 DIAGNOSIS — Z794 Long term (current) use of insulin: Secondary | ICD-10-CM | POA: Insufficient documentation

## 2018-01-22 DIAGNOSIS — E119 Type 2 diabetes mellitus without complications: Secondary | ICD-10-CM | POA: Insufficient documentation

## 2018-01-22 DIAGNOSIS — Z955 Presence of coronary angioplasty implant and graft: Secondary | ICD-10-CM | POA: Diagnosis not present

## 2018-01-22 DIAGNOSIS — F1210 Cannabis abuse, uncomplicated: Secondary | ICD-10-CM | POA: Insufficient documentation

## 2018-01-22 DIAGNOSIS — Z79899 Other long term (current) drug therapy: Secondary | ICD-10-CM | POA: Diagnosis not present

## 2018-01-22 DIAGNOSIS — F329 Major depressive disorder, single episode, unspecified: Secondary | ICD-10-CM | POA: Diagnosis not present

## 2018-01-22 DIAGNOSIS — M542 Cervicalgia: Secondary | ICD-10-CM | POA: Diagnosis present

## 2018-01-22 DIAGNOSIS — I5022 Chronic systolic (congestive) heart failure: Secondary | ICD-10-CM | POA: Insufficient documentation

## 2018-01-22 MED ORDER — GABAPENTIN 300 MG PO CAPS: 300.0000 mg | ORAL_CAPSULE | Freq: Three times a day (TID) | ORAL | 0 refills | Status: DC

## 2018-01-22 MED ORDER — KETOROLAC TROMETHAMINE 10 MG PO TABS: 10.0000 mg | ORAL_TABLET | Freq: Three times a day (TID) | ORAL | 0 refills | Status: DC

## 2018-01-22 MED ORDER — KETOROLAC TROMETHAMINE 30 MG/ML IJ SOLN
30.0000 mg | Freq: Once | INTRAMUSCULAR | Status: AC
  Administered 2018-01-22: 30 mg via INTRAMUSCULAR
  Filled 2018-01-22: qty 1

## 2018-01-22 NOTE — ED Notes (Signed)
See triage note  Presents with pain to right side of neck which is moving into right arm and fingers    Denies any injury  Good pulses

## 2018-01-22 NOTE — ED Provider Notes (Signed)
[facility] Emergency Department Provider Note ____________________________________________  Time seen: 1103  I have reviewed the triage vital signs and the nursing notes.  HISTORY  Chief Complaint  Neck Pain  HPI Sarah Payne is a 72 y.o. female since herself to the ED for evaluation of continued neck pain with some right upper remedy radicular symptoms.  Patient apparently was evaluated by her provider at the pain clinic for the same complaint last week.  She admits that x-rays were performed, but reports she was never given the results of the x-ray images.  She presents today with complaints of tingling in the right index middle and long fingers.  She denies any preceding injury, accident, trauma.  Patient does have a history of chronic DDD lumbar spine.  She has most recently been placed on meloxicam and cyclobenzaprine for this complaint.  Past Medical History:  Diagnosis Date  . Cataract    BILATERAL REMOVED  . Chronic low back pain   . COPD (chronic obstructive pulmonary disease) ([place])   . Coronary artery disease    a. multiple PCIs to the mLCx. b. stent to dRCA 08/2011 in setting of NSTEMI. c. DES to prox-mid LCx for ISR 09/2011. d.  DESx2 to prox-mRCA 07/2012. e. Takotsubo event 12/2015 with patent stents.  . Depression   . Diabetes mellitus   . Diverticulosis   . Frequent PVCs    a. Noted in hospital 12/2015.  Marland Kitchen GERD (gastroesophageal reflux disease)   . Hyperlipidemia   . Hypertension   . Hypothyroidism   . Impingement syndrome of left shoulder   . Marijuana abuse   . Myocardial infarction ([place])    x 5  . Sleep apnea    mild-does not use cpap  . Takotsubo cardiomyopathy    a. 12/2015 - nephew committed suicide 1 week prior, sister died the morning of presentation - initially called a STEMI; cath with patent stents. LVEF 25-30%.  . Tendonitis of left rotator cuff   . Tobacco abuse   . Vascular dementia     Patient Active Problem List   Diagnosis Date Noted  . Neck pain 01/19/2018  . Right shoulder pain 01/19/2018  . Chronic pain syndrome 01/19/2018  . Lumbar degenerative disc disease 09/29/2017  . Lumbar spondylosis with myelopathy 09/29/2017  . Chronic bilateral back pain 05/19/2017  . Insomnia 01/12/2017  . Cannabis use disorder, mild, abuse 09/03/2016  . Cognitive changes 09/03/2016  . Takotsubo cardiomyopathy 01/02/2016  . Frequent PVCs 01/02/2016  . Acute systolic (congestive) heart failure ([place]) 01/01/2016  . HTN (hypertension) 09/20/2015  . Type 2 diabetes mellitus, uncontrolled ([place]) 02/23/2015  . Benign neoplasm of colon 11/02/2014  . Internal hemorrhoids 11/02/2014  . Adjustment disorder with mixed anxiety and depressed mood 08/16/2014  . Esophageal reflux 05/23/2014  . Diverticulosis of colon without hemorrhage 05/17/2014  . Peripheral polyneuropathy 05/05/2014  . Tobacco dependence 05/05/2014  . COPD, moderate ([place]) 03/29/2013  . Noncompliance with diabetes treatment 02/27/2013  . Obstructive sleep apnea 02/25/2013  . Chronic systolic congestive heart failure ([place]) 05/21/2012  . Lichen sclerosus et atrophicus 05/20/2012  . Bronchitis, chronic obstructive ([place]) 05/20/2012  . Type II diabetes mellitus with neurological manifestations ([place]) 08/28/2011  . History of lumbar fusion 06/29/2011  . Depression 06/24/2011  . Hypothyroidism 05/03/2011  . CAD, multiple vessel 02/24/2011  . Tobacco abuse 02/24/2011  . Mixed hyperlipidemia 02/24/2011    Past Surgical History:  Procedure Laterality Date  . ABDOMINAL HYSTERECTOMY    . APPENDECTOMY    .  BACK SURGERY    . CARDIAC CATHETERIZATION  2013  . CARDIAC CATHETERIZATION  2016  . CARDIAC CATHETERIZATION N/A 01/01/2016   Procedure: Left Heart Cath and Coronary Angiography;  Surgeon: Jettie Booze, MD;  Location: [facility];  Service: Cardiovascular;  Laterality: N/A;  . CATARACT EXTRACTION W/PHACO Left 01/30/2015   Procedure: CATARACT  EXTRACTION PHACO AND INTRAOCULAR LENS PLACEMENT (IOC);  Surgeon: Birder Robson, MD;  Location: [facility];  Service: Ophthalmology;  Laterality: Left;  [place] 00:47   . CATARACT EXTRACTION W/PHACO Right 02/13/2015   Procedure: CATARACT EXTRACTION PHACO AND INTRAOCULAR LENS PLACEMENT (IOC);  Surgeon: Birder Robson, MD;  Location: [facility];  Service: Ophthalmology;  Laterality: Right;  cassette lot # [id number] H [place]  00:29.9 AP  20.7 CDE  6.20  . COLONOSCOPY N/A 11/02/2014   Procedure: COLONOSCOPY;  Surgeon: Inda Castle, MD;  Location: [place];  Service: Endoscopy;  Laterality: N/A;  . CORONARY ANGIOPLASTY  2012   stent x 3   . CORONARY ANGIOPLASTY WITH STENT PLACEMENT  2013    Prior to Admission medications   Medication Sig Start Date End Date Taking? Authorizing Provider  amitriptyline (ELAVIL) 50 MG tablet Take 1 tablet (50 mg total) by mouth at bedtime. 01/19/18   Vevelyn Francois, NP  aspirin 81 MG tablet Take 81 mg by mouth daily.      [provider]  carvedilol (COREG) 3.125 MG tablet Take 2 tablets (6.25 mg total) by mouth 2 (two) times daily with a meal. 07/17/17   Gollan, Kathlene November, MD  cyclobenzaprine (FLEXERIL) 5 MG tablet Take 1 tablet (5 mg total) by mouth 3 (three) times daily as needed for muscle spasms. 01/19/18 02/18/18  Vevelyn Francois, NP  enalapril (VASOTEC) 20 MG tablet Take 1 tablet by mouth once daily for blood pressure. 11/24/17   [name], NP  ezetimibe (ZETIA) 10 MG tablet Take 1 tablet (10 mg total) by mouth daily. 07/17/17   Minna Merritts, MD  gabapentin (NEURONTIN) 300 MG capsule Take 1 capsule (300 mg total) by mouth 3 (three) times daily. 01/22/18 02/21/18  Iszabella Hebenstreit, Dannielle Karvonen, PA-C  glucose blood (FREESTYLE LITE) test strip USE 1 STRIP TO CHECK BLOOD SUGAR 5 TIMES DAILY FOR UNCONTROLLED DIABETES MELLITUS 11/11/17   Philemon Kingdom, MD  glucose monitoring kit (FREESTYLE) monitoring kit 1 each by Does not apply route as needed for other. PHARMACY  PLEASE SUBSTITUTE WHATEVER GLUCOMETER YOU CARRY 06/02/12   Crecencio Mc, MD  insulin aspart (NOVOLOG FLEXPEN) 100 UNIT/ML FlexPen Inject 14-22 Units into the skin 3 (three) times daily with meals. Normally takes 12 units 07/17/17   Philemon Kingdom, MD  Insulin Glargine (LANTUS SOLOSTAR) 100 UNIT/ML Solostar Pen Inject 40 Units into the skin at bedtime. 07/17/17   Philemon Kingdom, MD  Insulin Pen Needle (INSUPEN PEN NEEDLES) 32G X 4 MM MISC Use to inject insulin 4 times daily. 01/08/17   Philemon Kingdom, MD  isosorbide mononitrate (IMDUR) 30 MG 24 hr tablet Take 1 tablet (30 mg total) by mouth daily. 07/17/17   Minna Merritts, MD  ketorolac (TORADOL) 10 MG tablet Take 1 tablet (10 mg total) by mouth every 8 (eight) hours. 01/22/18   Tyrone Pautsch, Dannielle Karvonen, PA-C  levothyroxine (SYNTHROID, LEVOTHROID) 125 MCG tablet Take 1 tablet (125 mcg total) by mouth daily. 05/01/17   Philemon Kingdom, MD  meloxicam (MOBIC) 15 MG tablet Take 1 tablet (15 mg total) by mouth daily. 01/19/18   Dionisio David  M, NP  ondansetron (ZOFRAN-ODT) 8 MG disintegrating tablet Take 1 tablet (8 mg total) by mouth every 8 (eight) hours as needed for nausea. 12/28/17   Elby Beck, FNP  pantoprazole (PROTONIX) 40 MG tablet Take 1 tablet (40 mg total) by mouth daily. 07/17/17   Minna Merritts, MD  rosuvastatin (CRESTOR) 40 MG tablet Take 1 tablet (40 mg total) by mouth daily. 07/17/17 07/17/18  Minna Merritts, MD  sitaGLIPtin (JANUVIA) 100 MG tablet Take 1 tablet (100 mg total) by mouth daily. 04/17/17   Philemon Kingdom, MD  traZODone (DESYREL) 100 MG tablet Take 1 tablet (100 mg total) by mouth at bedtime. 11/24/17   [name], NP    Allergies No known allergies  Family History  Problem Relation Age of Onset  . Heart attack Mother        First MI @ 56 - Died @ 59  . Heart disease Mother   . Heart disease Father        Died @ 33  . Throat cancer Brother   . Liver cancer Brother   . Colon cancer  Sister     Social History Social History   Tobacco Use  . Smoking status: Current Every Day Smoker    Packs/day: 1.00    Years: 45.00    Pack years: 45.00    Types: Cigarettes  . Smokeless tobacco: Never Used  . Tobacco comment: Has cut back, trying to quit.   Substance Use Topics  . Alcohol use: No    Alcohol/week: 0.0 oz  . Drug use: Yes    Types: Marijuana    Comment: last night 4.13.16    Review of Systems  Constitutional: Negative for fever. Eyes: Negative for visual changes. ENT: Negative for sore throat. Cardiovascular: Negative for chest pain. Respiratory: Negative for shortness of breath. Gastrointestinal: Negative for abdominal pain, vomiting and diarrhea. Genitourinary: Negative for dysuria. Musculoskeletal: Negative for back pain.  Reports neck pain as above. Skin: Negative for rash. Neurological: Negative for headaches, focal weakness or numbness.  Reports RUE radicular symptoms as above. ____________________________________________  PHYSICAL EXAM:  VITAL SIGNS: ED Triage Vitals  Enc Vitals Group     BP 01/22/18 1031 (!) 154/84     Pulse Rate 01/22/18 1031 67     Resp 01/22/18 1031 16     Temp --      Temp src --      SpO2 01/22/18 1031 97 %     Weight 01/22/18 1032 147 lb (66.7 kg)     Height 01/22/18 1032 _0  (1.651 m)     Head Circumference --      Peak Flow --      Pain Score 01/22/18 1032 8     Pain Loc --      Pain Edu? --      Excl. in [place]? --     Constitutional: Alert and oriented. Well appearing and in no distress. Head: Normocephalic and atraumatic. Eyes: Conjunctivae are normal. Normal extraocular movements Neck: Supple.  Normal range of motion without crepitus.   Cardiovascular: Normal rate, regular rhythm. Normal distal pulses. Respiratory: Normal respiratory effort. No wheezes/rales/rhonchi. Musculoskeletal: Nontender with normal range of motion in all extremities.  Neurologic: Cranial nerves II through XII grossly intact.   Normal UE DTRs bilaterally.  Normal gait without ataxia. Normal speech and language. No gross focal neurologic deficits are appreciated. Skin:  Skin is warm, dry and intact. No rash noted. ____________________________________________   OIZTIWPYK  Cervical Spine (01/19/18)  IMPRESSION: Mild multilevel degenerative disc and facet joint change. No evidence of ligamentous instability. No acute bony abnormality. ____________________________________________  PROCEDURES  Procedures Toradol 30 mg IM ____________________________________________  INITIAL IMPRESSION / ASSESSMENT AND PLAN / ED COURSE  Patient has radicular symptoms to the right upper extremity, likely due to her significant DDD and bone spurs of the cervical spine.  She reports significant improvement of her symptoms following IM medication administration.  She will be discharged with a prescription for ketorolac that she may dose in lieu of her meloxicam for the next 5 days.  She is also given a prescription for gabapentin which she may start this evening.  She will follow-up with her primary provider, pain management specialist, or return to the ED as discussed. ____________________________________________  FINAL CLINICAL IMPRESSION(S) / ED DIAGNOSES  Final diagnoses:  Cervical radiculopathy  DDD (degenerative disc disease), cervical      Carmie End, Dannielle Karvonen, PA-C 01/22/18 1845    Arta Silence, MD 01/23/18 1103

## 2018-01-22 NOTE — ED Triage Notes (Signed)
Pt to ED via POV c/o neck pain x 1 week. Pt states that the pain radiates down her right arm and 3 fingers on her right hand are numb. Pt denies any known injury. Pt states that she was seen at the pain clinic last week for same complaint and had x-rays done but has not heard back from these. Pt is in NAD at this time.

## 2018-01-22 NOTE — Discharge Instructions (Signed)
You appear to be having nerve irritation which may represent radiculopathy from your cervical degenerative disc disease. Take the prescription meds as directed. Put your previously prescribed Meloxicam for the next 5 days.

## 2018-01-26 NOTE — Progress Notes (Signed)
Results were reviewed and found to be: mildly abnormal  No acute injury or pathology identified  Review would suggest interventional pain management techniques may be of benefit 

## 2018-02-01 ENCOUNTER — Telehealth: Payer: Self-pay

## 2018-02-01 NOTE — Telephone Encounter (Signed)
Pt called and states pain in neck is worse and radiating down her right arm with numbness from elbow to fingers, she's having a hard time using her arm. Pt state's from her last visit [facility] advised her that we might have to refer her to someone who could fix her problem. ? Referral

## 2018-02-01 NOTE — Telephone Encounter (Signed)
Her xrays did not show anything significant  I will but in for a NCS and have her follow up with Dr. Holley Raring for a CESI . If she wants surgery then we can have her evaluated by neurosurgery I will allow them to order an MRI if she wants to do this options.

## 2018-02-02 NOTE — Telephone Encounter (Signed)
Patient notified of above.  Will discuss at next appointment with Dr Holley Raring.

## 2018-02-09 ENCOUNTER — Ambulatory Visit: Payer: Self-pay | Admitting: *Deleted

## 2018-02-09 NOTE — Telephone Encounter (Signed)
Pt reports 1 blister lower abdomen, under pannus with clear drainage and foul odor.  Noted 3 days ago. "Horrible odor from blister."  States fluid filled blister is 1 in long and 1/2 in wide. States entire fold is red, itchy and "Inflammed." States she has an incision line at area from OR 20 years ago and 'It's along there." States area has been moist. Has been placing Clobetasol  cream on area she had from previous "problem with skin." (RX 2015) Afebrile. Appt made with Glenda Chroman for tomorrow. Care advise given per protocol. Instructed pt to stop the clobetasol ointment,gently cleanse entire area with antibacterial soap, rinse well,dry well, keep pannus dry, apply dry sterile non adherent dressing to blister.  Reason for Disposition . Looks like a boil or deep ulcer    Fluid filled blister but with foul odor and drainage  Answer Assessment - Initial Assessment Questions 1. APPEARANCE of SORES: "What do the sores look like?"     Blister, fluid filled 2. NUMBER: "How many sores are there?"     one 3. SIZE: "How big is the largest sore?"     1 in x 1/2 in wide 4. LOCATION: "Where are the sores located?"     Lower abdomen, under pannus 5. ONSET: "When did the sores begin?"     Noted 3 days ago 6. CAUSE: "What do you think is causing the sores?"     Unsure 7. OTHER SYMPTOMS: "Do you have any other symptoms?" (e.g., fever, new weakness)    No.  Itchy and burning, foul odor, clear drainage  Protocols used: SORES-A-AH

## 2018-02-09 NOTE — Telephone Encounter (Signed)
Pt has 30' appt on 02/10/18 at 2pm with Glenda Chroman FNP.

## 2018-02-10 ENCOUNTER — Ambulatory Visit: Payer: Medicare Other | Attending: Nurse Practitioner | Admitting: Nurse Practitioner

## 2018-02-10 ENCOUNTER — Encounter: Payer: Self-pay | Admitting: Nurse Practitioner

## 2018-02-10 ENCOUNTER — Ambulatory Visit (INDEPENDENT_AMBULATORY_CARE_PROVIDER_SITE_OTHER): Payer: Medicare Other | Admitting: Family Medicine

## 2018-02-10 ENCOUNTER — Encounter: Payer: Self-pay | Admitting: Family Medicine

## 2018-02-10 VITALS — BP 114/60 | HR 79 | Temp 97.7°F | Ht 65.0 in | Wt 148.5 lb

## 2018-02-10 VITALS — BP 139/84 | HR 65 | Temp 97.5°F | Resp 16 | Ht 65.0 in | Wt 147.0 lb

## 2018-02-10 DIAGNOSIS — G894 Chronic pain syndrome: Secondary | ICD-10-CM | POA: Diagnosis not present

## 2018-02-10 DIAGNOSIS — Z794 Long term (current) use of insulin: Secondary | ICD-10-CM | POA: Insufficient documentation

## 2018-02-10 DIAGNOSIS — E782 Mixed hyperlipidemia: Secondary | ICD-10-CM | POA: Insufficient documentation

## 2018-02-10 DIAGNOSIS — I5022 Chronic systolic (congestive) heart failure: Secondary | ICD-10-CM | POA: Insufficient documentation

## 2018-02-10 DIAGNOSIS — I110 Hypertensive heart disease with heart failure: Secondary | ICD-10-CM | POA: Diagnosis not present

## 2018-02-10 DIAGNOSIS — I2510 Atherosclerotic heart disease of native coronary artery without angina pectoris: Secondary | ICD-10-CM | POA: Diagnosis not present

## 2018-02-10 DIAGNOSIS — L304 Erythema intertrigo: Secondary | ICD-10-CM

## 2018-02-10 DIAGNOSIS — M4712 Other spondylosis with myelopathy, cervical region: Secondary | ICD-10-CM | POA: Diagnosis not present

## 2018-02-10 DIAGNOSIS — Z981 Arthrodesis status: Secondary | ICD-10-CM | POA: Insufficient documentation

## 2018-02-10 DIAGNOSIS — M5126 Other intervertebral disc displacement, lumbar region: Secondary | ICD-10-CM | POA: Insufficient documentation

## 2018-02-10 DIAGNOSIS — M48061 Spinal stenosis, lumbar region without neurogenic claudication: Secondary | ICD-10-CM | POA: Diagnosis not present

## 2018-02-10 DIAGNOSIS — M545 Low back pain: Secondary | ICD-10-CM | POA: Diagnosis not present

## 2018-02-10 DIAGNOSIS — M5136 Other intervertebral disc degeneration, lumbar region: Secondary | ICD-10-CM | POA: Insufficient documentation

## 2018-02-10 DIAGNOSIS — D126 Benign neoplasm of colon, unspecified: Secondary | ICD-10-CM | POA: Insufficient documentation

## 2018-02-10 DIAGNOSIS — M79601 Pain in right arm: Secondary | ICD-10-CM

## 2018-02-10 DIAGNOSIS — I252 Old myocardial infarction: Secondary | ICD-10-CM | POA: Insufficient documentation

## 2018-02-10 DIAGNOSIS — J449 Chronic obstructive pulmonary disease, unspecified: Secondary | ICD-10-CM | POA: Insufficient documentation

## 2018-02-10 DIAGNOSIS — M4802 Spinal stenosis, cervical region: Secondary | ICD-10-CM | POA: Insufficient documentation

## 2018-02-10 DIAGNOSIS — Z9119 Patient's noncompliance with other medical treatment and regimen: Secondary | ICD-10-CM | POA: Insufficient documentation

## 2018-02-10 DIAGNOSIS — R51 Headache: Secondary | ICD-10-CM | POA: Insufficient documentation

## 2018-02-10 DIAGNOSIS — K219 Gastro-esophageal reflux disease without esophagitis: Secondary | ICD-10-CM | POA: Insufficient documentation

## 2018-02-10 DIAGNOSIS — F329 Major depressive disorder, single episode, unspecified: Secondary | ICD-10-CM | POA: Diagnosis not present

## 2018-02-10 DIAGNOSIS — M79602 Pain in left arm: Secondary | ICD-10-CM | POA: Diagnosis not present

## 2018-02-10 DIAGNOSIS — E1165 Type 2 diabetes mellitus with hyperglycemia: Secondary | ICD-10-CM | POA: Insufficient documentation

## 2018-02-10 DIAGNOSIS — G4700 Insomnia, unspecified: Secondary | ICD-10-CM | POA: Insufficient documentation

## 2018-02-10 DIAGNOSIS — M25511 Pain in right shoulder: Secondary | ICD-10-CM | POA: Insufficient documentation

## 2018-02-10 DIAGNOSIS — M47812 Spondylosis without myelopathy or radiculopathy, cervical region: Secondary | ICD-10-CM | POA: Insufficient documentation

## 2018-02-10 DIAGNOSIS — F4323 Adjustment disorder with mixed anxiety and depressed mood: Secondary | ICD-10-CM | POA: Insufficient documentation

## 2018-02-10 DIAGNOSIS — I493 Ventricular premature depolarization: Secondary | ICD-10-CM | POA: Diagnosis not present

## 2018-02-10 DIAGNOSIS — I5181 Takotsubo syndrome: Secondary | ICD-10-CM | POA: Insufficient documentation

## 2018-02-10 DIAGNOSIS — M542 Cervicalgia: Secondary | ICD-10-CM

## 2018-02-10 DIAGNOSIS — K5730 Diverticulosis of large intestine without perforation or abscess without bleeding: Secondary | ICD-10-CM | POA: Insufficient documentation

## 2018-02-10 DIAGNOSIS — L900 Lichen sclerosus et atrophicus: Secondary | ICD-10-CM | POA: Insufficient documentation

## 2018-02-10 DIAGNOSIS — G8929 Other chronic pain: Secondary | ICD-10-CM | POA: Diagnosis not present

## 2018-02-10 DIAGNOSIS — G4733 Obstructive sleep apnea (adult) (pediatric): Secondary | ICD-10-CM | POA: Diagnosis not present

## 2018-02-10 DIAGNOSIS — Z7982 Long term (current) use of aspirin: Secondary | ICD-10-CM | POA: Insufficient documentation

## 2018-02-10 DIAGNOSIS — E039 Hypothyroidism, unspecified: Secondary | ICD-10-CM | POA: Insufficient documentation

## 2018-02-10 DIAGNOSIS — F17210 Nicotine dependence, cigarettes, uncomplicated: Secondary | ICD-10-CM | POA: Insufficient documentation

## 2018-02-10 DIAGNOSIS — Z79899 Other long term (current) drug therapy: Secondary | ICD-10-CM | POA: Insufficient documentation

## 2018-02-10 MED ORDER — NYSTATIN 100000 UNIT/GM EX CREA: 1.0000 "application " | TOPICAL_CREAM | Freq: Two times a day (BID) | CUTANEOUS | 0 refills | Status: DC

## 2018-02-10 NOTE — Progress Notes (Signed)
Subjective:    Patient ID: Sarah [facility], female    DOB: 05/02/1946, 72 y.o.   MRN: [id number]  HPI This is a 72 yo female who presents today with skin irritation on lower abdomen. States this is at site of hysterectomy. Started 4-5 days ago. Some clear drainage. She has been applying clobetasol cream which seems to be making it worse. She reports good blood sugar control. Denies additional location of rash.   Past Medical History:  Diagnosis Date  . Cataract    BILATERAL REMOVED  . Chronic low back pain   . COPD (chronic obstructive pulmonary disease) ([place])   . Coronary artery disease    a. multiple PCIs to the mLCx. b. stent to dRCA 08/2011 in setting of NSTEMI. c. DES to prox-mid LCx for ISR 09/2011. d.  DESx2 to prox-mRCA 07/2012. e. Takotsubo event 12/2015 with patent stents.  . Depression   . Diabetes mellitus   . Diverticulosis   . Frequent PVCs    a. Noted in hospital 12/2015.  Marland Kitchen GERD (gastroesophageal reflux disease)   . Hyperlipidemia   . Hypertension   . Hypothyroidism   . Impingement syndrome of left shoulder   . Marijuana abuse   . Myocardial infarction ([place])    x 5  . Sleep apnea    mild-does not use cpap  . Takotsubo cardiomyopathy    a. 12/2015 - nephew committed suicide 1 week prior, sister died the morning of presentation - initially called a STEMI; cath with patent stents. LVEF 25-30%.  . Tendonitis of left rotator cuff   . Tobacco abuse   . Vascular dementia    Past Surgical History:  Procedure Laterality Date  . ABDOMINAL HYSTERECTOMY    . APPENDECTOMY    . BACK SURGERY    . CARDIAC CATHETERIZATION  2013  . CARDIAC CATHETERIZATION  2016  . CARDIAC CATHETERIZATION N/A 01/01/2016   Procedure: Left Heart Cath and Coronary Angiography;  Surgeon: Jettie Booze, MD;  Location: [facility];  Service: Cardiovascular;  Laterality: N/A;  . CATARACT EXTRACTION W/PHACO Left 01/30/2015   Procedure: CATARACT EXTRACTION PHACO AND INTRAOCULAR LENS PLACEMENT  (IOC);  Surgeon: Birder Robson, MD;  Location: [facility];  Service: Ophthalmology;  Laterality: Left;  [place] 00:47   . CATARACT EXTRACTION W/PHACO Right 02/13/2015   Procedure: CATARACT EXTRACTION PHACO AND INTRAOCULAR LENS PLACEMENT (IOC);  Surgeon: Birder Robson, MD;  Location: [facility];  Service: Ophthalmology;  Laterality: Right;  cassette lot # [id number] H [place]  00:29.9 AP  20.7 CDE  6.20  . COLONOSCOPY N/A 11/02/2014   Procedure: COLONOSCOPY;  Surgeon: Inda Castle, MD;  Location: [place];  Service: Endoscopy;  Laterality: N/A;  . CORONARY ANGIOPLASTY  2012   stent x 3   . CORONARY ANGIOPLASTY WITH STENT PLACEMENT  2013   Family History  Problem Relation Age of Onset  . Heart attack Mother        First MI @ 21 - Died @ 46  . Heart disease Mother   . Heart disease Father        Died @ 13  . Throat cancer Brother   . Liver cancer Brother   . Colon cancer Sister    Social History   Tobacco Use  . Smoking status: Current Every Day Smoker    Packs/day: 1.00    Years: 45.00    Pack years: 45.00    Types: Cigarettes  . Smokeless tobacco: Never Used  . Tobacco comment:  Has cut back, trying to quit.   Substance Use Topics  . Alcohol use: No    Alcohol/week: 0.0 oz  . Drug use: Yes    Types: Marijuana    Comment: last night 4.13.16      Review of Systems Per HPI    Objective:   Physical Exam  Constitutional: She is oriented to person, place, and time. She appears well-developed and well-nourished. No distress.  HENT:  Head: Normocephalic and atraumatic.  Eyes: Conjunctivae are normal.  Cardiovascular: Normal rate.  Pulmonary/Chest: Effort normal.  Neurological: She is alert and oriented to person, place, and time.  Skin: Skin is warm and dry. Rash noted. She is not diaphoretic.  Lower abdomen with small pannus, area under pannus with confluent, erythematous, macular rash. Slightly moist appearing, no frank drainage.   Psychiatric: She has a normal mood and  affect. Her behavior is normal. Judgment and thought content normal.  Vitals reviewed.     BP 114/60 (BP Location: Right Arm, Patient Position: Sitting, Cuff Size: Normal)   Pulse 79   Temp 97.7 F (36.5 C) (Oral)   Ht 5\' 5"  (1.651 m)   Wt 148 lb 8 oz (67.4 kg)   SpO2 98%   BMI 24.71 kg/m  Wt Readings from Last 3 Encounters:  02/10/18 148 lb 8 oz (67.4 kg)  02/10/18 147 lb (66.7 kg)  01/22/18 147 lb (66.7 kg)       Assessment & Plan:  1. [place]trigo - Provided written and verbal information regarding diagnosis and treatment. - RTC precautions reviewed - Encouraged her to keep area clean and dry, call place guaze in pannus to help with dryness and irritation - nystatin cream (MYCOSTATIN); Apply 1 application topically 2 (two) times daily. Apply a thin layer to clean and dry skin for up to 3 weeks.  Dispense: 30 g; Refill: 0   Clarene Reamer, FNP-BC  [facility]  02/10/2018 2:24 PM

## 2018-02-10 NOTE — Progress Notes (Signed)
Patient's Name: Sarah Payne  MRN: [id number]  Referring Provider: [name], NP  DOB: 10/02/1945  PCP: [name], NP  DOS: 02/10/2018  Note by: Vevelyn Francois NP  Service setting: Ambulatory outpatient  Specialty: Interventional Pain Management  Location: [facility] (AMB) Pain Management Facility    Patient type: Established    Primary Reason(s) for Visit: Evaluation of chronic illnesses with exacerbation, or progression (Level of risk: moderate) CC: Neck Pain; Back Pain (lower bilateral ); and Arm Pain (bilateral, right is worse )  HPI  Sarah Payne is a 72 y.o. year old, female patient, who comes today for a follow-up evaluation. She has CAD, multiple vessel; Tobacco abuse; Mixed hyperlipidemia; Hypothyroidism; Depression; History of lumbar fusion; Type II diabetes mellitus with neurological manifestations ([place]); Lichen sclerosus et atrophicus; Bronchitis, chronic obstructive ([place]); Chronic systolic congestive heart failure ([place]); Obstructive sleep apnea; Noncompliance with diabetes treatment; COPD, moderate ([place]); Diverticulosis of colon without hemorrhage; Esophageal reflux; Adjustment disorder with mixed anxiety and depressed mood; Benign neoplasm of colon; Internal hemorrhoids; HTN (hypertension); Acute systolic (congestive) heart failure ([place]); Takotsubo cardiomyopathy; Frequent PVCs; Insomnia; Chronic bilateral back pain; Lumbar degenerative disc disease; Lumbar spondylosis with myelopathy; Cannabis use disorder, mild, abuse; Cognitive changes; Peripheral polyneuropathy; Tobacco dependence; Type 2 diabetes mellitus, uncontrolled ([place]); Neck pain; Right shoulder pain; Chronic pain syndrome; Chronic neck pain; Chronic pain of right upper extremity; and Cervical spondylosis on their problem list. [name] was last seen on 01/19/2018. Her primarily concern today is the Neck Pain; Back Pain (lower bilateral ); and Arm Pain (bilateral, right is worse )  Pain Assessment: Location: Left, Right  Neck(lower back) Radiating: neck pain is going down both arms, worse on the right  Onset: More than a month ago Duration: Chronic pain Quality: Discomfort, Aching, Tingling, Numbness(right arm is numb from elbow down and the first three fingers., ) Severity: 2 /10 (subjective, self-reported pain score)  Note: Reported level is compatible with observation.                          Effect on ADL: when in pain she is unable to do anything.  has good days and bad days.  Timing: Constant Modifying factors: medications BP: 139/84  HR: 65  Further details on both, my assessment(s), as well as the proposed treatment plan, please see below. She states that she is in today to have her injection scheduled. She admits that she was seen in the ER because the pain got so bad in her right arms. She states that the medication was not efective. Sheadmits that she wants to get the injection to see if this woud help with her arm pain. She states that she was given the result of her images but the apt CESI was not sehcedule. She does not want to have any surgery. She states that "one rod in her spine was enough."    Laboratory Chemistry  Inflammation Markers (CRP: Acute Phase) (ESR: Chronic Phase) Lab Results  Component Value Date   CRP 2.1 04/11/2013   ESRSEDRATE 58 (H) 04/11/2013                         Rheumatology Markers No results found for: RF, ANA, LABURIC, URICUR, LYMEIGGIGMAB, LYMEABIGMQN, HLAB27                      Renal Function Markers Lab Results  Component Value Date  BUN 12 11/17/2017   CREATININE 0.99 11/17/2017   BCR 19 08/11/2012   GFRAA >60 01/09/2016   GFRNONAA >60 01/09/2016                             Hepatic Function Markers Lab Results  Component Value Date   AST 14 11/17/2017   ALT 12 11/17/2017   ALBUMIN 3.9 11/17/2017   ALKPHOS 76 11/17/2017   HCVAB NEGATIVE 03/12/2015   AMYLASE 32 04/11/2016   LIPASE 55.0 04/11/2016                        Electrolytes Lab  Results  Component Value Date   NA 141 11/17/2017   K 3.6 11/17/2017   CL 102 11/17/2017   CALCIUM 9.3 11/17/2017   MG 1.1 (L) 06/12/2014                        Neuropathy Markers Lab Results  Component Value Date   VITAMINB12 542 10/13/2011   HGBA1C 9.2 (A) 01/18/2018                        Bone Pathology Markers Lab Results  Component Value Date   VD25OH 41 02/25/2013                         Coagulation Parameters Lab Results  Component Value Date   INR 1.01 01/09/2016   LABPROT 13.5 01/09/2016   APTT 109 (H) 01/01/2016   PLT 289.0 06/02/2016                        Cardiovascular Markers Lab Results  Component Value Date   BNP 91 08/01/2014   CKTOTAL 272 (H) 08/01/2014   CKMB 4.5 (H) 08/01/2014   TROPONINI <0.03 01/09/2016   HGB 14.7 06/02/2016   HCT 43.7 06/02/2016                         CA Markers No results found for: CEA, CA125, LABCA2                      Note: Lab results reviewed.  Recent Diagnostic Imaging Review  Cervical Imaging: Cervical MR wo contrast:  Results for orders placed during the hospital encounter of 07/09/12  MR Cervical Spine Wo Contrast   Narrative *RADIOLOGY REPORT*  Clinical Data: 72 year old female with severe headache.  Cervical spine disease, bilateral hand numbness and headaches.  MRI CERVICAL SPINE WITHOUT CONTRAST  Technique:  Multiplanar and multiecho pulse sequences of the cervical spine, to include the craniocervical junction and cervicothoracic junction, were obtained according to standard protocol without intravenous contrast.  Comparison: [place] cervical radiographs 11/26/2011.  Findings: Cervicomedullary junction is within normal limits. Spinal cord signal is within normal limits at all visualized levels.  Visualized paraspinal soft tissues are within normal limits.  Vertebral height and alignment within normal limits. No marrow edema or evidence of acute osseous abnormality.  C2-C3:   Severe right facet hypertrophy.  Mild to moderate ligament flavum hypertrophy.  No spinal stenosis.  Mild to moderate right C3 foraminal stenosis.  C3-C4:  Small cephalad disc protrusion with annular tear. Narrowing the ventral CSF space but no significant spinal stenosis. Mild to moderate right facet hypertrophy.  No significant foraminal  stenosis.  C4-C5:  Mild circumferential disc bulge.  Right greater than left uncovertebral hypertrophy.  Mild facet hypertrophy also greater on the right.  No spinal stenosis.  Moderate to severe right and mild left C5 foraminal stenosis.  C5-C6:  Small caudal central disc protrusion with annular tear. Mild ligament flavum hypertrophy.  No significant spinal stenosis. Mild facet and uncovertebral hypertrophy greater on the left. Moderate left C6 foraminal stenosis.  C6-C7:  Minimal disc bulge.  No stenosis.  C7-T1:  Mild disc bulge.  Right greater than left uncovertebral hypertrophy.  No significant stenosis. Negative visualized upper thoracic levels.  IMPRESSION: 1.  Severe facet degeneration at C2-C3 on the right. Multilevel other cervical facet degeneration is less pronounced.  Moderate right C3 and moderate to severe right C5 and left C6 foraminal stenosis. 2.  Multiple small cervical disc herniations.  No significant cervical spinal stenosis.   Original Report Authenticated By: Roselyn Reef, M.D.    Cervical DG Bending/F/E views:  Results for orders placed during the hospital encounter of 01/19/18  DG Cervical Spine With Flex & Extend   Narrative CLINICAL DATA:  Neck pain radiating into the right shoulder with right hand numbness and tingling for the past week. No known injury.  EXAM: CERVICAL SPINE COMPLETE WITH FLEXION AND EXTENSION VIEWS  COMPARISON:  MRI of the cervical spine of October 15, 2006  FINDINGS: The cervical vertebral bodies are preserved in height. There is mild disc space narrowing at C4-5, C5-6, and C6-7. There  are anterior endplate osteophytes at these levels. There is mild facet joint hypertrophy. The spinous processes are intact. As the patient moves from the neutral to the flexed and to the extended positions. There is no significant instability of the vertebral bodies. The odontoid is intact. The prevertebral soft tissue spaces are normal.  IMPRESSION: Mild multilevel degenerative disc and facet joint change. No evidence of ligamentous instability. No acute bony abnormality.   Electronically Signed   By: David  Martinique M.D.   On: 01/19/2018 13:17    Cervical DG complete:  Results for orders placed during the hospital encounter of 11/26/11  DG Cervical Spine Complete   Narrative *RADIOLOGY REPORT*  Clinical Data: Recurrent occipital headache and neck pain  CERVICAL SPINE - COMPLETE 4+ VIEW  Comparison: None.  Findings: Five views of the cervical spine submitted.  No acute fracture or subluxation.  C1-C2 relationship is unremarkable. There is mild disc space flattening with mild anterior spurring at C5-C6 level.  Minimal disc space flattening noted at C4-C5 and C6- C7 level.  Mild disc space flattening with mild anterior spurring noted at C7-T1 level.  Mild anterior spurring noted lower endplate of C4 and C6 vertebral body. Bilateral mild narrowing of the neural foramina at C5-C6 level.  No prevertebral soft tissue swelling.  Cervical airway is patent.  IMPRESSION:  No acute fracture or subluxation.  Degenerative changes as described above.  Original Report Authenticated By: Lahoma Crocker, M.D.  Shoulder Imaging: Shoulder-R DG:  Results for orders placed during the hospital encounter of 01/19/18  DG Shoulder Right   Narrative CLINICAL DATA:  Neck pain radiating into the right shoulder and right hand with numbness and tingling for the past week. No report of injury.  EXAM: RIGHT SHOULDER - 2+ VIEW  COMPARISON:  No recent studies in [facility]  FINDINGS: The bones are  subjectively adequately mineralized. There is minimal spurring from the inferior lip of the glenoid. The joint spaces reasonably well-maintained. The [facility] joint exhibits mild  narrowing without significant osteophyte formation. The subacromial subdeltoid space is normal.  IMPRESSION: Mild degenerative change of the glenohumeral and AC joints without significant joint space loss. No acute fracture or dislocation.   Electronically Signed   By: David  Martinique M.D.   On: 01/19/2018 13:18    Shoulder-L DG:  Results for orders placed in visit on 05/02/13  DG Shoulder Left   Lumbosacral Imaging:  Results for orders placed in visit on 01/12/02  CT Lumbar Spine W Contrast   Narrative FINDINGS CLINICAL DATA:  BACK PAIN WITHOUT RADIATION. LUMBAR MYELOGRAM FOLLOWING INFORMED CONSENT, STERILE PREPARATION OF THE BACK, AND ADEQUATE LOCAL ANESTHESIA, A LUMBAR PUNCTURE WAS PERFORMED USING A 22 GAUGE NEEDLE AT L2-3 FROM A LEFT PARASPINOUS APPROACH. FLUID WAS CLEAR AND COLORLESS.  15 CC OF OMNIPAQUE WAS INSTILLED INTO THE SUBARACHNOID SPACE.  AP, LATERAL, AND OBLIQUE VIEWS DEMONSTRATE MILD WAIST-LIKE NARROWING AT L4-5 WITH MILD EFFACEMENT OF BOTH L5 ROOTS.  NO OTHER AREAS OF SPINAL STENOSIS ARE SEEN.  THERE IS DISK SPACE NARROWING AT L3-4 AND L4-5.  A MODERATE SIZED VENTRAL DEFECT IS SEEN AT L4-5 REPRESENTING DISK MATERIAL.  A SIMILAR SLIGHTLY LESS PROMINENT DEFECT IS SEEN AT L3-4.   MILD ANNULAR BULGING IS IDENTIFIED AT L2-3. FLEXION/EXTENSION SHOW FAIR RANGE OF MOTION WITHOUT ABNORMAL MOVEMENT. POST MYELOGRAM CT THE LOWER FIVE DISK SPACES WERE EXAMINED: L1-2:  NORMAL INTERSPACE. L2-3:  MILD ANNULAR BULGING.  NO STENOSIS OR DISK PROTRUSION. L3-4:  MILD FACET HYPERTROPHY.  BROAD-BASED CENTRAL DISK PROTRUSION.  NO L4 NERVE ROOT ENCROACHMENT. SOME AIR IS SEEN IN THE FORAMEN AT L3-4 ON THE LEFT.  I SUSPECT THIS IS RELATED TO MY LUMBAR PUNCTURE. L4-5:  BROAD-BASED DISK PROTRUSION CENTRALLY WITH  ASSOCIATED POSTERIOR ELEMENT HYPERTROPHY.  MILD BILATERAL L5 NERVE ROOT EFFACEMENT IS SEEN. L5-S1:  NORMAL INTERSPACE. IMPRESSION 1)    BROAD-BASED DISK PROTRUSION L4-5 CENTRALLY WITH POSTERIOR ELEMENT HYPERTROPHY; MILD BILATERAL L5 NERVE ROOT ENCROACHMENT IS PRESENT. 2)   SIMILAR, BUT SMALLER DISK PROTRUSION L3-4 WITHOUT L4 NERVE ROOT ENCROACHMENT. 3)   MILD ANNULAR BULGING L2-3.   Results for orders placed during the hospital encounter of 08/27/11  DG Lumbar Spine 2-3 Views   Narrative *RADIOLOGY REPORT*  Clinical Data: 72 year old female with back pain radiating to both lower extremities.  LUMBAR SPINE - 2-3 VIEW  Comparison: None.  Findings: Normal lumbar segmentation.  Previous transpedicular and interbody fusion from L3-L4 to L5-S1 plus decompression. Transpedicular hardware appears intact.  Severe disc space loss, vacuum disc phenomena, and endplate spurring at W4-X3.  Negative visualized sacrum and SI joints.  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  Original Report Authenticated By: Randall An, M.D.   Lumbar DG (Complete) 4+V:  Results for orders placed during the hospital encounter of 05/06/16  DG Lumbar Spine Complete   Narrative CLINICAL DATA:  Low back pain  EXAM: LUMBAR SPINE - COMPLETE 4+ VIEW  COMPARISON:  Abdominal radiograph 04/04/2016  FINDINGS: There is posterior fusion hardware extending from L3-S1 with bilateral transpedicular screws and spinal rods, with a crossbar at the L3-4 level. There is disc spacer material at L3-4, L4-5 and L5-S1. Alignment at diffuse segments is normal. There is no clear evidence of hardware loosening or infection.  There is marked narrowing of the intervertebral disc space at L2-L3 with associated formation of an anterior osteophyte. No acute fracture. The sacroiliac joints and visualized pelvic structures are unremarkable.  IMPRESSION: 1.  Posterior spinal fusion from L3-S1 in  normal alignment. No hardware abnormality. 2. Adjacent segment disease at L2-L3 with disc space narrowing, osteophyte formation and endplate sclerosis.   Electronically Signed   By: Ulyses Jarred M.D.   On: 05/06/2016 14:18    Lumbar DG Myelogram views:  Results for orders placed in visit on 01/12/02  DG Myelogram Lumbar   Narrative FINDINGS CLINICAL DATA:  BACK PAIN WITHOUT RADIATION. LUMBAR MYELOGRAM FOLLOWING INFORMED CONSENT, STERILE PREPARATION OF THE BACK, AND ADEQUATE LOCAL ANESTHESIA, A LUMBAR PUNCTURE WAS PERFORMED USING A 22 GAUGE NEEDLE AT L2-3 FROM A LEFT PARASPINOUS APPROACH. FLUID WAS CLEAR AND COLORLESS.  15 CC OF OMNIPAQUE WAS INSTILLED INTO THE SUBARACHNOID SPACE.  AP, LATERAL, AND OBLIQUE VIEWS DEMONSTRATE MILD WAIST-LIKE NARROWING AT L4-5 WITH MILD EFFACEMENT OF BOTH L5 ROOTS.  NO OTHER AREAS OF SPINAL STENOSIS ARE SEEN.  THERE IS DISK SPACE NARROWING AT L3-4 AND L4-5.  A MODERATE SIZED VENTRAL DEFECT IS SEEN AT L4-5 REPRESENTING DISK MATERIAL.  A SIMILAR SLIGHTLY LESS PROMINENT DEFECT IS SEEN AT L3-4.   MILD ANNULAR BULGING IS IDENTIFIED AT L2-3. FLEXION/EXTENSION SHOW FAIR RANGE OF MOTION WITHOUT ABNORMAL MOVEMENT. POST MYELOGRAM CT THE LOWER FIVE DISK SPACES WERE EXAMINED: L1-2:  NORMAL INTERSPACE. L2-3:  MILD ANNULAR BULGING.  NO STENOSIS OR DISK PROTRUSION. L3-4:  MILD FACET HYPERTROPHY.  BROAD-BASED CENTRAL DISK PROTRUSION.  NO L4 NERVE ROOT ENCROACHMENT. SOME AIR IS SEEN IN THE FORAMEN AT L3-4 ON THE LEFT.  I SUSPECT THIS IS RELATED TO MY LUMBAR PUNCTURE. L4-5:  BROAD-BASED DISK PROTRUSION CENTRALLY WITH ASSOCIATED POSTERIOR ELEMENT HYPERTROPHY.  MILD BILATERAL L5 NERVE ROOT EFFACEMENT IS SEEN. L5-S1:  NORMAL INTERSPACE. IMPRESSION 1)    BROAD-BASED DISK PROTRUSION L4-5 CENTRALLY WITH POSTERIOR ELEMENT HYPERTROPHY; MILD BILATERAL L5 NERVE ROOT ENCROACHMENT IS PRESENT. 2)   SIMILAR, BUT SMALLER DISK PROTRUSION L3-4  WITHOUT L4 NERVE ROOT ENCROACHMENT. 3)   MILD ANNULAR BULGING L2-3.  Complexity Note: Imaging results reviewed. Results shared with Ms. Owens Shark, using Layman's terms.                         Meds   Current Outpatient Medications:  .  amitriptyline (ELAVIL) 50 MG tablet, Take 1 tablet (50 mg total) by mouth at bedtime., Disp: 30 tablet, Rfl: 2 .  aspirin 81 MG tablet, Take 81 mg by mouth daily.  , Disp: , Rfl:  .  carvedilol (COREG) 3.125 MG tablet, Take 2 tablets (6.25 mg total) by mouth 2 (two) times daily with a meal., Disp: 360 tablet, Rfl: 3 .  cyclobenzaprine (FLEXERIL) 5 MG tablet, Take 1 tablet (5 mg total) by mouth 3 (three) times daily as needed for muscle spasms., Disp: 90 tablet, Rfl: 0 .  enalapril (VASOTEC) 20 MG tablet, Take 1 tablet by mouth once daily for blood pressure., Disp: 90 tablet, Rfl: 0 .  ezetimibe (ZETIA) 10 MG tablet, Take 1 tablet (10 mg total) by mouth daily., Disp: 90 tablet, Rfl: 3 .  gabapentin (NEURONTIN) 300 MG capsule, Take 1 capsule (300 mg total) by mouth 3 (three) times daily., Disp: 90 capsule, Rfl: 0 .  glucose blood (FREESTYLE LITE) test strip, USE 1 STRIP TO CHECK BLOOD SUGAR 5 TIMES DAILY FOR UNCONTROLLED DIABETES MELLITUS, Disp: 400 each, Rfl: 5 .  glucose monitoring kit (FREESTYLE) monitoring kit, 1 each by Does not apply route as needed for other. PHARMACY PLEASE SUBSTITUTE WHATEVER GLUCOMETER YOU CARRY, Disp: 1 each, Rfl: 1 .  insulin aspart (NOVOLOG FLEXPEN) 100 UNIT/ML FlexPen,  Inject 14-22 Units into the skin 3 (three) times daily with meals. Normally takes 12 units, Disp: 15 pen, Rfl: 3 .  Insulin Glargine (LANTUS SOLOSTAR) 100 UNIT/ML Solostar Pen, Inject 40 Units into the skin at bedtime., Disp: 15 pen, Rfl: 3 .  Insulin Pen Needle (INSUPEN PEN NEEDLES) 32G X 4 MM MISC, Use to inject insulin 4 times daily., Disp: 250 each, Rfl: 1 .  isosorbide mononitrate (IMDUR) 30 MG 24 hr tablet, Take 1 tablet (30 mg total) by mouth daily., Disp: 90 tablet,  Rfl: 3 .  ketorolac (TORADOL) 10 MG tablet, Take 1 tablet (10 mg total) by mouth every 8 (eight) hours., Disp: 15 tablet, Rfl: 0 .  levothyroxine (SYNTHROID, LEVOTHROID) 125 MCG tablet, Take 1 tablet (125 mcg total) by mouth daily., Disp: 90 tablet, Rfl: 3 .  ondansetron (ZOFRAN-ODT) 8 MG disintegrating tablet, Take 1 tablet (8 mg total) by mouth every 8 (eight) hours as needed for nausea., Disp: 20 tablet, Rfl: 0 .  pantoprazole (PROTONIX) 40 MG tablet, Take 1 tablet (40 mg total) by mouth daily., Disp: 90 tablet, Rfl: 3 .  rosuvastatin (CRESTOR) 40 MG tablet, Take 1 tablet (40 mg total) by mouth daily., Disp: 90 tablet, Rfl: 3 .  sitaGLIPtin (JANUVIA) 100 MG tablet, Take 1 tablet (100 mg total) by mouth daily., Disp: 90 tablet, Rfl: 3 .  traZODone (DESYREL) 100 MG tablet, Take 1 tablet (100 mg total) by mouth at bedtime., Disp: 90 tablet, Rfl: 1 .  nystatin cream (MYCOSTATIN), Apply 1 application topically 2 (two) times daily. Apply a thin layer to clean and dry skin for up to 3 weeks., Disp: 30 g, Rfl: 0  ROS  Constitutional: Denies any fever or chills Gastrointestinal: No reported hemesis, hematochezia, vomiting, or acute GI distress Musculoskeletal: Denies any acute onset joint swelling, redness, loss of ROM, or weakness Neurological: No reported episodes of acute onset apraxia, aphasia, dysarthria, agnosia, amnesia, paralysis, loss of coordination, or loss of consciousness  Allergies  Ms. Mysliwiec is allergic to no known allergies.  [place]  Drug: Ms. Dorion  reports that she has current or past drug history. Drug: Marijuana. Alcohol:  reports that she does not drink alcohol. Tobacco:  reports that she has been smoking cigarettes.  She has a 45.00 pack-year smoking history. She has never used smokeless tobacco. Medical:  has a past medical history of Cataract, Chronic low back pain, COPD (chronic obstructive pulmonary disease) ([place]), Coronary artery disease, Depression, Diabetes mellitus,  Diverticulosis, Frequent PVCs, GERD (gastroesophageal reflux disease), Hyperlipidemia, Hypertension, Hypothyroidism, Impingement syndrome of left shoulder, Marijuana abuse, Myocardial infarction [facility]), Sleep apnea, Takotsubo cardiomyopathy, Tendonitis of left rotator cuff, Tobacco abuse, and Vascular dementia. Surgical: Ms. Tarpley  has a past surgical history that includes Back surgery; Appendectomy; Abdominal hysterectomy; Colonoscopy (N/A, 11/02/2014); Cataract extraction w/PHACO (Left, 01/30/2015); Cardiac catheterization (2013); Coronary angioplasty (2012); Coronary angioplasty with stent (2013); Cardiac catheterization (2016); Cataract extraction w/PHACO (Right, 02/13/2015); and Cardiac catheterization (N/A, 01/01/2016). Family: family history includes Colon cancer in her sister; Heart attack in her mother; Heart disease in her father and mother; Liver cancer in her brother; Throat cancer in her brother.  Constitutional Exam  General appearance: Well nourished, well developed, and well hydrated. In no apparent acute distress Vitals:   02/10/18 1034  BP: 139/84  Pulse: 65  Resp: 16  Temp: (!) 97.5 F (36.4 C)  TempSrc: Oral  SpO2: 98%  Weight: 147 lb (66.7 kg)  Height: 5' 5" (1.651 m)   BMI Assessment: Estimated  body mass index is 24.46 kg/m as calculated from the following:   Height as of this encounter: 5' 5" (1.651 m).   Weight as of this encounter: 147 lb (66.7 kg). Psych/Mental status: Alert, oriented x 3 (person, place, & time)       Eyes: PERLA Respiratory: No evidence of acute respiratory distress  Cervical Spine Area Exam  Skin & Axial Inspection: No masses, redness, edema, swelling, or associated skin lesions Alignment: Symmetrical Functional ROM: Decreased ROM      Stability: No instability detected Muscle Tone/Strength: Guarding observed Sensory (Neurological): Movement-associated pain Palpation: Complains of area being tender to palpation Cervical compression test  positive for cervical facet disease  Upper Extremity (UE) Exam    Side: Right upper extremity  Side: Left upper extremity  Skin & Extremity Inspection: Skin color, temperature, and hair growth are WNL. No peripheral edema or cyanosis. No masses, redness, swelling, asymmetry, or associated skin lesions. No contractures.  Skin & Extremity Inspection: Skin color, temperature, and hair growth are WNL. No peripheral edema or cyanosis. No masses, redness, swelling, asymmetry, or associated skin lesions. No contractures.  Functional ROM: adequate  ROM for wrist and hand  Functional ROM: Unrestricted ROM          Muscle Tone/Strength: Movement possible against some resistance (4/5)  Muscle Tone/Strength: Functionally intact. No obvious neuro-muscular anomalies detected.  Sensory (Neurological): Dermatomal pain pattern        forearm and finger digit 1-3  Sensory (Neurological): Unimpaired          Palpation: No palpable anomalies              Palpation: No palpable anomalies              Provocative Test(s):  Phalen's test: deferred Tinel's test: deferred Apley's scratch test (touch opposite shoulder):  Action 1 (Across chest): deferred Action 2 (Overhead): deferred Action 3 (LB reach): deferred   Provocative Test(s):  Phalen's test: deferred Tinel's test: deferred Apley's scratch test (touch opposite shoulder):  Action 1 (Across chest): deferred Action 2 (Overhead): deferred Action 3 (LB reach): deferred    Gait & Posture Assessment  Ambulation: Unassisted Gait: Relatively normal for age and body habitus Posture: WNL   Assessment  Primary Diagnosis & Pertinent Problem List: The primary encounter diagnosis was Osteoarthritis of cervical spine, unspecified spinal osteoarthritis complication status. Diagnoses of Chronic pain of right upper extremity, Chronic neck pain, and Chronic pain syndrome were also pertinent to this visit.  Status Diagnosis  Controlled Controlled Controlled 1.  Osteoarthritis of cervical spine, unspecified spinal osteoarthritis complication status   2. Chronic pain of right upper extremity   3. Chronic neck pain   4. Chronic pain syndrome     Problems updated and reviewed during this visit: Problem  Chronic Neck Pain  Chronic Pain of Right Upper Extremity  Cervical Spondylosis  Neck Pain   Plan of Care  Pharmacotherapy (Medications Ordered): No orders of the defined types were placed in this encounter. Medications administered today: Megan Salon. Kees had no medications administered during this visit. Lab-work, procedure(s), and/or referral(s): Orders Placed This Encounter  Procedures  . Cervical Epidural Injection   Imaging and/or referral(s): None  Interventional therapies: Planned, scheduled, and/or pending:   Not at this time. Spoke with patient on the phone. She admits that she can discontinue the use of Plavix and she has had this clearance in the past. We will send a cardiac clearance to Dr Rockey Situ office . Patient was made  aware that if she did not hear from our office that she was to call us by July 30 to see if he has approved her discontinuing the Plavix.    Considering:   [facility]    Provider-requested follow-up: Return for Appointment As Scheduled, in addition, Procedure(w/Sedation), w/ Dr. Holley Raring.  Future Appointments  Date Time Provider [place]  02/24/2018  9:30 AM Gillis Santa, MD [facility] None  04/21/2018  9:00 AM Vevelyn Francois, NP [facility] None  05/04/2018 10:15 AM Philemon Kingdom, MD [facility] None  11/24/2018 10:00 AM Eustace Pen, LPN [facility]  11/22/979  2:20 PM [name], NP [facility]   Primary Care Physician: [name], NP Location: [facility] Note by: Vevelyn Francois NP Date: 02/10/2018; Time: 4:56 PM  Pain Score Disclaimer: We use the NRS-11 scale. This is a self-reported, subjective measurement of pain severity with only modest  accuracy. It is used primarily to identify changes within a particular patient. It must be understood that outpatient pain scales are significantly less accurate that those used for research, where they can be applied under ideal controlled circumstances with minimal exposure to variables. In reality, the score is likely to be a combination of pain intensity and pain affect, where pain affect describes the degree of emotional arousal or changes in action readiness caused by the sensory experience of pain. Factors such as social and work situation, setting, emotional state, anxiety levels, expectation, and prior pain experience may influence pain perception and show large inter-individual differences that may also be affected by time variables.  Patient instructions provided during this appointment: Patient Instructions   ____________________________________________________________________________________________  Pain Scale  Introduction: The pain score used by this practice is the Verbal Numerical Rating Scale (VNRS-11). This is an 11-point scale. It is for adults and children 10 years or older. There are significant differences in how the pain score is reported, used, and applied. Forget everything you learned in the past and learn this scoring system.  General Information: The scale should reflect your current level of pain. Unless you are specifically asked for the level of your worst pain, or your average pain. If you are asked for one of these two, then it should be understood that it is over the past 24 hours.  Basic Activities of Daily Living (ADL): Personal hygiene, dressing, eating, transferring, and using restroom.  Instructions: Most patients tend to report their level of pain as a combination of two factors, their physical pain and their psychosocial pain. This last one is also known as "suffering" and it is reflection of how physical pain affects you socially and psychologically. From  now on, report them separately. From this point on, when asked to report your pain level, report only your physical pain. Use the following table for reference.  Pain Clinic Pain Levels (0-5/10)  Pain Level Score  Description  No Pain 0   Mild pain 1 Nagging, annoying, but does not interfere with basic activities of daily living (ADL). Patients are able to eat, bathe, get dressed, toileting (being able to get on and off the toilet and perform personal hygiene functions), transfer (move in and out of bed or a chair without assistance), and maintain continence (able to control bladder and bowel functions). Blood pressure and heart rate are unaffected. A normal heart rate for a healthy adult ranges from 60 to 100 bpm (beats per minute).   Mild to moderate pain 2 Noticeable and distracting. Impossible  to hide from other people. More frequent flare-ups. Still possible to adapt and function close to normal. It can be very annoying and may have occasional stronger flare-ups. With discipline, patients may get used to it and adapt.   Moderate pain 3 Interferes significantly with activities of daily living (ADL). It becomes difficult to feed, bathe, get dressed, get on and off the toilet or to perform personal hygiene functions. Difficult to get in and out of bed or a chair without assistance. Very distracting. With effort, it can be ignored when deeply involved in activities.   Moderately severe pain 4 Impossible to ignore for more than a few minutes. With effort, patients may still be able to manage work or participate in some social activities. Very difficult to concentrate. Signs of autonomic nervous system discharge are evident: dilated pupils (mydriasis); mild sweating (diaphoresis); sleep interference. Heart rate becomes elevated (>115 bpm). Diastolic blood pressure (lower number) rises above 100 mmHg. Patients find relief in laying down and not moving.   Severe pain 5 Intense and extremely unpleasant.  Associated with frowning face and frequent crying. Pain overwhelms the senses.  Ability to do any activity or maintain social relationships becomes significantly limited. Conversation becomes difficult. Pacing back and forth is common, as getting into a comfortable position is nearly impossible. Pain wakes you up from deep sleep. Physical signs will be obvious: pupillary dilation; increased sweating; goosebumps; brisk reflexes; cold, clammy hands and feet; nausea, vomiting or dry heaves; loss of appetite; significant sleep disturbance with inability to fall asleep or to remain asleep. When persistent, significant weight loss is observed due to the complete loss of appetite and sleep deprivation.  Blood pressure and heart rate becomes significantly elevated. Caution: If elevated blood pressure triggers a pounding headache associated with blurred vision, then the patient should immediately seek attention at an urgent or emergency care unit, as these may be signs of an impending stroke.    Emergency Department Pain Levels (6-10/10)  Emergency Room Pain 6 Severely limiting. Requires emergency care and should not be seen or managed at an outpatient pain management facility. Communication becomes difficult and requires great effort. Assistance to reach the emergency department may be required. Facial flushing and profuse sweating along with potentially dangerous increases in heart rate and blood pressure will be evident.   Distressing pain 7 Self-care is very difficult. Assistance is required to transport, or use restroom. Assistance to reach the emergency department will be required. Tasks requiring coordination, such as bathing and getting dressed become very difficult.   Disabling pain 8 Self-care is no longer possible. At this level, pain is disabling. The individual is unable to do even the most "basic" activities such as walking, eating, bathing, dressing, transferring to a bed, or toileting. Fine motor  skills are lost. It is difficult to think clearly.   Incapacitating pain 9 Pain becomes incapacitating. Thought processing is no longer possible. Difficult to remember your own name. Control of movement and coordination are lost.   The worst pain imaginable 10 At this level, most patients pass out from pain. When this level is reached, collapse of the autonomic nervous system occurs, leading to a sudden drop in blood pressure and heart rate. This in turn results in a temporary and dramatic drop in blood flow to the brain, leading to a loss of consciousness. Fainting is one of the body's self defense mechanisms. Passing out puts the brain in a calmed state and causes it to shut down for a while,  in order to begin the healing process.    Summary: 1. Refer to this scale when providing [place] with your pain level. 2. Be accurate and careful when reporting your pain level. This will help with your care. 3. Over-reporting your pain level will lead to loss of credibility. 4. Even a level of 1/10 means that there is pain and will be treated at our facility. 5. High, inaccurate reporting will be documented as "Symptom Exaggeration", leading to loss of credibility and suspicions of possible secondary gains such as obtaining more narcotics, or wanting to appear disabled, for fraudulent reasons. 6. Only pain levels of 5 or below will be seen at our facility. 7. Pain levels of 6 and above will be sent to the Emergency Department and the appointment cancelled. ____________________________________________________________________________________________   ____________________________________________________________________________________________  General Risks and Possible Complications  Patient Responsibilities: It is important that you read this as it is part of your informed consent. It is our duty to inform you of the risks and possible complications associated with treatments offered to you. It is your  responsibility as a patient to read this and to ask questions about anything that is not clear or that you believe was not covered in this document.  Patient's Rights: You have the right to refuse treatment. You also have the right to change your mind, even after initially having agreed to have the treatment done. However, under this last option, if you wait until the last second to change your mind, you may be charged for the materials used up to that point.  Introduction: Medicine is not an Chief Strategy Officer. Everything in Medicine, including the lack of treatment(s), carries the potential for danger, harm, or loss (which is by definition: Risk). In Medicine, a complication is a secondary problem, condition, or disease that can aggravate an already existing one. All treatments carry the risk of possible complications. The fact that a side effects or complications occurs, does not imply that the treatment was conducted incorrectly. It must be clearly understood that these can happen even when everything is done following the highest safety standards.  No treatment: You can choose not to proceed with the proposed treatment alternative. The "PRO(s)" would include: avoiding the risk of complications associated with the therapy. The "CON(s)" would include: not getting any of the treatment benefits. These benefits fall under one of three categories: diagnostic; therapeutic; and/or palliative. Diagnostic benefits include: getting information which can ultimately lead to improvement of the disease or symptom(s). Therapeutic benefits are those associated with the successful treatment of the disease. Finally, palliative benefits are those related to the decrease of the primary symptoms, without necessarily curing the condition (example: decreasing the pain from a flare-up of a chronic condition, such as incurable terminal cancer).  General Risks and Complications: These are associated to most interventional treatments.  They can occur alone, or in combination. They fall under one of the following six (6) categories: no benefit or worsening of symptoms; bleeding; infection; nerve damage; allergic reactions; and/or death. 1. No benefits or worsening of symptoms: In Medicine there are no guarantees, only probabilities. No healthcare provider can ever guarantee that a medical treatment will work, they can only state the probability that it may. Furthermore, there is always the possibility that the condition may worsen, either directly, or indirectly, as a consequence of the treatment. 2. Bleeding: This is more common if the patient is taking a blood thinner, either prescription or over the counter (example: Goody Powders, Fish oil, Aspirin,  Garlic, etc.), or if suffering a condition associated with impaired coagulation (example: Hemophilia, cirrhosis of the liver, low platelet counts, etc.). However, even if you do not have one on these, it can still happen. If you have any of these conditions, or take one of these drugs, make sure to notify your treating physician. 3. Infection: This is more common in patients with a compromised immune system, either due to disease (example: diabetes, cancer, human immunodeficiency virus [HIV], etc.), or due to medications or treatments (example: therapies used to treat cancer and rheumatological diseases). However, even if you do not have one on these, it can still happen. If you have any of these conditions, or take one of these drugs, make sure to notify your treating physician. 4. Nerve Damage: This is more common when the treatment is an invasive one, but it can also happen with the use of medications, such as those used in the treatment of cancer. The damage can occur to small secondary nerves, or to large primary ones, such as those in the spinal cord and brain. This damage may be temporary or permanent and it may lead to impairments that can range from temporary numbness to permanent  paralysis and/or brain death. 5. Allergic Reactions: Any time a substance or material comes in contact with our body, there is the possibility of an allergic reaction. These can range from a mild skin rash (contact dermatitis) to a severe systemic reaction (anaphylactic reaction), which can result in death. 6. Death: In general, any medical intervention can result in death, most of the time due to an unforeseen complication. ____________________________________________________________________________________________  Epidural Steroid Injection Patient Information  Description: The epidural space surrounds the nerves as they exit the spinal cord.  In some patients, the nerves can be compressed and inflamed by a bulging disc or a tight spinal canal (spinal stenosis).  By injecting steroids into the epidural space, we can bring irritated nerves into direct contact with a potentially helpful medication.  These steroids act directly on the irritated nerves and can reduce swelling and inflammation which often leads to decreased pain.  Epidural steroids may be injected anywhere along the spine and from the neck to the low back depending upon the location of your pain.   After numbing the skin with local anesthetic (like Novocaine), a small needle is passed into the epidural space slowly.  You may experience a sensation of pressure while this is being done.  The entire block usually last less than 10 minutes.  Conditions which may be treated by epidural steroids:   Low back and leg pain  Neck and arm pain  Spinal stenosis  Post-laminectomy syndrome  Herpes zoster (shingles) pain  Pain from compression fractures  Preparation for the injection:  1. Do not eat any solid food or dairy products within 8 hours of your appointment.  2. You may drink clear liquids up to 3 hours before appointment.  Clear liquids include water, black coffee, juice or soda.  No milk or cream please. 3. You may take your  regular medication, including pain medications, with a sip of water before your appointment  Diabetics should hold regular insulin (if taken separately) and take 1/2 normal NPH dos the morning of the procedure.  Carry some sugar containing items with you to your appointment. 4. A driver must accompany you and be prepared to drive you home after your procedure.  5. Bring all your current medications with your. 6. An IV may be inserted and sedation  may be given at the discretion of the physician.   7. A blood pressure cuff, EKG and other monitors will often be applied during the procedure.  Some patients may need to have extra oxygen administered for a short period. 8. You will be asked to provide medical information, including your allergies, prior to the procedure.  We must know immediately if you are taking blood thinners (like Coumadin/Warfarin)  Or if you are allergic to IV iodine contrast (dye). We must know if you could possible be pregnant.  Possible side-effects:  Bleeding from needle site  Infection (rare, may require surgery)  Nerve injury (rare)  Numbness & tingling (temporary)  Difficulty urinating (rare, temporary)  Spinal headache ( a headache worse with upright posture)  Light -headedness (temporary)  Pain at injection site (several days)  Decreased blood pressure (temporary)  Weakness in arm/leg (temporary)  Pressure sensation in back/neck (temporary)  Call if you experience:  Fever/chills associated with headache or increased back/neck pain.  Headache worsened by an upright position.  New onset weakness or numbness of an extremity below the injection site  Hives or difficulty breathing (go to the emergency room)  Inflammation or drainage at the infection site  Severe back/neck pain  Any new symptoms which are concerning to you  Please note:  Although the local anesthetic injected can often make your back or neck feel good for several hours after the  injection, the pain will likely return.  It takes 3-7 days for steroids to work in the epidural space.  You may not notice any pain relief for at least that one week.  If effective, we will often do a series of three injections spaced 3-6 weeks apart to maximally decrease your pain.  After the initial series, we generally will wait several months before considering a repeat injection of the same type.  If you have any questions, please call [phone] [facility]

## 2018-02-10 NOTE — Patient Instructions (Addendum)
____________________________________________________________________________________________  Pain Scale  Introduction: The pain score used by this practice is the Verbal Numerical Rating Scale (VNRS-11). This is an 11-point scale. It is for adults and children 10 years or older. There are significant differences in how the pain score is reported, used, and applied. Forget everything you learned in the past and learn this scoring system.  General Information: The scale should reflect your current level of pain. Unless you are specifically asked for the level of your worst pain, or your average pain. If you are asked for one of these two, then it should be understood that it is over the past 24 hours.  Basic Activities of Daily Living (ADL): Personal hygiene, dressing, eating, transferring, and using restroom.  Instructions: Most patients tend to report their level of pain as a combination of two factors, their physical pain and their psychosocial pain. This last one is also known as "suffering" and it is reflection of how physical pain affects you socially and psychologically. From now on, report them separately. From this point on, when asked to report your pain level, report only your physical pain. Use the following table for reference.  Pain Clinic Pain Levels (0-5/10)  Pain Level Score  Description  No Pain 0   Mild pain 1 Nagging, annoying, but does not interfere with basic activities of daily living (ADL). Patients are able to eat, bathe, get dressed, toileting (being able to get on and off the toilet and perform personal hygiene functions), transfer (move in and out of bed or a chair without assistance), and maintain continence (able to control bladder and bowel functions). Blood pressure and heart rate are unaffected. A normal heart rate for a healthy adult ranges from 60 to 100 bpm (beats per minute).   Mild to moderate pain 2 Noticeable and distracting. Impossible to hide from other  people. More frequent flare-ups. Still possible to adapt and function close to normal. It can be very annoying and may have occasional stronger flare-ups. With discipline, patients may get used to it and adapt.   Moderate pain 3 Interferes significantly with activities of daily living (ADL). It becomes difficult to feed, bathe, get dressed, get on and off the toilet or to perform personal hygiene functions. Difficult to get in and out of bed or a chair without assistance. Very distracting. With effort, it can be ignored when deeply involved in activities.   Moderately severe pain 4 Impossible to ignore for more than a few minutes. With effort, patients may still be able to manage work or participate in some social activities. Very difficult to concentrate. Signs of autonomic nervous system discharge are evident: dilated pupils (mydriasis); mild sweating (diaphoresis); sleep interference. Heart rate becomes elevated (>115 bpm). Diastolic blood pressure (lower number) rises above 100 mmHg. Patients find relief in laying down and not moving.   Severe pain 5 Intense and extremely unpleasant. Associated with frowning face and frequent crying. Pain overwhelms the senses.  Ability to do any activity or maintain social relationships becomes significantly limited. Conversation becomes difficult. Pacing back and forth is common, as getting into a comfortable position is nearly impossible. Pain wakes you up from deep sleep. Physical signs will be obvious: pupillary dilation; increased sweating; goosebumps; brisk reflexes; cold, clammy hands and feet; nausea, vomiting or dry heaves; loss of appetite; significant sleep disturbance with inability to fall asleep or to remain asleep. When persistent, significant weight loss is observed due to the complete loss of appetite and sleep deprivation.  Blood   pressure and heart rate becomes significantly elevated. Caution: If elevated blood pressure triggers a pounding headache  associated with blurred vision, then the patient should immediately seek attention at an urgent or emergency care unit, as these may be signs of an impending stroke.    Emergency Department Pain Levels (6-10/10)  Emergency Room Pain 6 Severely limiting. Requires emergency care and should not be seen or managed at an outpatient pain management facility. Communication becomes difficult and requires great effort. Assistance to reach the emergency department may be required. Facial flushing and profuse sweating along with potentially dangerous increases in heart rate and blood pressure will be evident.   Distressing pain 7 Self-care is very difficult. Assistance is required to transport, or use restroom. Assistance to reach the emergency department will be required. Tasks requiring coordination, such as bathing and getting dressed become very difficult.   Disabling pain 8 Self-care is no longer possible. At this level, pain is disabling. The individual is unable to do even the most "basic" activities such as walking, eating, bathing, dressing, transferring to a bed, or toileting. Fine motor skills are lost. It is difficult to think clearly.   Incapacitating pain 9 Pain becomes incapacitating. Thought processing is no longer possible. Difficult to remember your own name. Control of movement and coordination are lost.   The worst pain imaginable 10 At this level, most patients pass out from pain. When this level is reached, collapse of the autonomic nervous system occurs, leading to a sudden drop in blood pressure and heart rate. This in turn results in a temporary and dramatic drop in blood flow to the brain, leading to a loss of consciousness. Fainting is one of the body's self defense mechanisms. Passing out puts the brain in a calmed state and causes it to shut down for a while, in order to begin the healing process.    Summary: 1. Refer to this scale when providing [place] with your pain level. 2. Be  accurate and careful when reporting your pain level. This will help with your care. 3. Over-reporting your pain level will lead to loss of credibility. 4. Even a level of 1/10 means that there is pain and will be treated at our facility. 5. High, inaccurate reporting will be documented as "Symptom Exaggeration", leading to loss of credibility and suspicions of possible secondary gains such as obtaining more narcotics, or wanting to appear disabled, for fraudulent reasons. 6. Only pain levels of 5 or below will be seen at our facility. 7. Pain levels of 6 and above will be sent to the Emergency Department and the appointment cancelled. ____________________________________________________________________________________________   ____________________________________________________________________________________________  General Risks and Possible Complications  Patient Responsibilities: It is important that you read this as it is part of your informed consent. It is our duty to inform you of the risks and possible complications associated with treatments offered to you. It is your responsibility as a patient to read this and to ask questions about anything that is not clear or that you believe was not covered in this document.  Patient's Rights: You have the right to refuse treatment. You also have the right to change your mind, even after initially having agreed to have the treatment done. However, under this last option, if you wait until the last second to change your mind, you may be charged for the materials used up to that point.  Introduction: Medicine is not an Chief Strategy Officer. Everything in Medicine, including the lack of treatment(s), carries the  potential for danger, harm, or loss (which is by definition: Risk). In Medicine, a complication is a secondary problem, condition, or disease that can aggravate an already existing one. All treatments carry the risk of possible complications. The  fact that a side effects or complications occurs, does not imply that the treatment was conducted incorrectly. It must be clearly understood that these can happen even when everything is done following the highest safety standards.  No treatment: You can choose not to proceed with the proposed treatment alternative. The "PRO(s)" would include: avoiding the risk of complications associated with the therapy. The "CON(s)" would include: not getting any of the treatment benefits. These benefits fall under one of three categories: diagnostic; therapeutic; and/or palliative. Diagnostic benefits include: getting information which can ultimately lead to improvement of the disease or symptom(s). Therapeutic benefits are those associated with the successful treatment of the disease. Finally, palliative benefits are those related to the decrease of the primary symptoms, without necessarily curing the condition (example: decreasing the pain from a flare-up of a chronic condition, such as incurable terminal cancer).  General Risks and Complications: These are associated to most interventional treatments. They can occur alone, or in combination. They fall under one of the following six (6) categories: no benefit or worsening of symptoms; bleeding; infection; nerve damage; allergic reactions; and/or death. 1. No benefits or worsening of symptoms: In Medicine there are no guarantees, only probabilities. No healthcare provider can ever guarantee that a medical treatment will work, they can only state the probability that it may. Furthermore, there is always the possibility that the condition may worsen, either directly, or indirectly, as a consequence of the treatment. 2. Bleeding: This is more common if the patient is taking a blood thinner, either prescription or over the counter (example: Goody Powders, Fish oil, Aspirin, Garlic, etc.), or if suffering a condition associated with impaired coagulation (example: Hemophilia,  cirrhosis of the liver, low platelet counts, etc.). However, even if you do not have one on these, it can still happen. If you have any of these conditions, or take one of these drugs, make sure to notify your treating physician. 3. Infection: This is more common in patients with a compromised immune system, either due to disease (example: diabetes, cancer, human immunodeficiency virus [HIV], etc.), or due to medications or treatments (example: therapies used to treat cancer and rheumatological diseases). However, even if you do not have one on these, it can still happen. If you have any of these conditions, or take one of these drugs, make sure to notify your treating physician. 4. Nerve Damage: This is more common when the treatment is an invasive one, but it can also happen with the use of medications, such as those used in the treatment of cancer. The damage can occur to small secondary nerves, or to large primary ones, such as those in the spinal cord and brain. This damage may be temporary or permanent and it may lead to impairments that can range from temporary numbness to permanent paralysis and/or brain death. 5. Allergic Reactions: Any time a substance or material comes in contact with our body, there is the possibility of an allergic reaction. These can range from a mild skin rash (contact dermatitis) to a severe systemic reaction (anaphylactic reaction), which can result in death. 6. Death: In general, any medical intervention can result in death, most of the time due to an unforeseen complication. ____________________________________________________________________________________________  Epidural Steroid Injection Patient Information  Description: The epidural space  surrounds the nerves as they exit the spinal cord.  In some patients, the nerves can be compressed and inflamed by a bulging disc or a tight spinal canal (spinal stenosis).  By injecting steroids into the epidural space, we can  bring irritated nerves into direct contact with a potentially helpful medication.  These steroids act directly on the irritated nerves and can reduce swelling and inflammation which often leads to decreased pain.  Epidural steroids may be injected anywhere along the spine and from the neck to the low back depending upon the location of your pain.   After numbing the skin with local anesthetic (like Novocaine), a small needle is passed into the epidural space slowly.  You may experience a sensation of pressure while this is being done.  The entire block usually last less than 10 minutes.  Conditions which may be treated by epidural steroids:   Low back and leg pain  Neck and arm pain  Spinal stenosis  Post-laminectomy syndrome  Herpes zoster (shingles) pain  Pain from compression fractures  Preparation for the injection:  1. Do not eat any solid food or dairy products within 8 hours of your appointment.  2. You may drink clear liquids up to 3 hours before appointment.  Clear liquids include water, black coffee, juice or soda.  No milk or cream please. 3. You may take your regular medication, including pain medications, with a sip of water before your appointment  Diabetics should hold regular insulin (if taken separately) and take 1/2 normal NPH dos the morning of the procedure.  Carry some sugar containing items with you to your appointment. 4. A driver must accompany you and be prepared to drive you home after your procedure.  5. Bring all your current medications with your. 6. An IV may be inserted and sedation may be given at the discretion of the physician.   7. A blood pressure cuff, EKG and other monitors will often be applied during the procedure.  Some patients may need to have extra oxygen administered for a short period. 8. You will be asked to provide medical information, including your allergies, prior to the procedure.  We must know immediately if you are taking blood thinners  (like Coumadin/Warfarin)  Or if you are allergic to IV iodine contrast (dye). We must know if you could possible be pregnant.  Possible side-effects:  Bleeding from needle site  Infection (rare, may require surgery)  Nerve injury (rare)  Numbness & tingling (temporary)  Difficulty urinating (rare, temporary)  Spinal headache ( a headache worse with upright posture)  Light -headedness (temporary)  Pain at injection site (several days)  Decreased blood pressure (temporary)  Weakness in arm/leg (temporary)  Pressure sensation in back/neck (temporary)  Call if you experience:  Fever/chills associated with headache or increased back/neck pain.  Headache worsened by an upright position.  New onset weakness or numbness of an extremity below the injection site  Hives or difficulty breathing (go to the emergency room)  Inflammation or drainage at the infection site  Severe back/neck pain  Any new symptoms which are concerning to you  Please note:  Although the local anesthetic injected can often make your back or neck feel good for several hours after the injection, the pain will likely return.  It takes 3-7 days for steroids to work in the epidural space.  You may not notice any pain relief for at least that one week.  If effective, we will often do a series of  three injections spaced 3-6 weeks apart to maximally decrease your pain.  After the initial series, we generally will wait several months before considering a repeat injection of the same type.  If you have any questions, please call [phone] [facility]

## 2018-02-10 NOTE — Progress Notes (Signed)
Safety precautions to be maintained throughout the outpatient stay will include: orient to surroundings, keep bed in low position, maintain call bell within reach at all times, provide assistance with transfer out of bed and ambulation.   Went to ED approx 3 weeks ago for severe pain in right arm and was found to have problems with the top 4 vertebrae.

## 2018-02-10 NOTE — Patient Instructions (Signed)
Apply cream twice a day for up to 3 weeks,  If not better in 5-7 days or if worse, please come in for follow up  Skin Yeast Infection Skin yeast infection is a condition in which there is an overgrowth of yeast (candida) that normally lives on the skin. This condition usually occurs in areas of the skin that are constantly warm and moist, such as the armpits or the groin. What are the causes? This condition is caused by a change in the normal balance of the yeast and bacteria that live on the skin. What increases the risk? This condition is more likely to develop in:  People who are obese.  Pregnant women.  Women who take birth control pills.  People who have diabetes.  People who take antibiotic medicines.  People who take steroid medicines.  People who are malnourished.  People who have a weak defense (immune) system.  People who are 74 years of age or older.  What are the signs or symptoms? Symptoms of this condition include:  A red, swollen area of the skin.  Bumps on the skin.  Itchiness.  How is this diagnosed? This condition is diagnosed with a medical history and physical exam. Your health care provider may check for yeast by taking light scrapings of the skin to be viewed under a microscope. How is this treated? This condition is treated with medicine. Medicines may be prescribed or be available over-the-counter. The medicines may be:  Taken by mouth (orally).  Applied as a cream.  Follow these instructions at home:  Take or apply over-the-counter and prescription medicines only as told by your health care provider.  Eat more yogurt. This may help to keep your yeast infection from returning.  Maintain a healthy weight. If you need help losing weight, talk with your health care provider.  Keep your skin clean and dry.  If you have diabetes, keep your blood sugar under control. Contact a health care provider if:  Your symptoms go away and then  return.  Your symptoms do not get better with treatment.  Your symptoms get worse.  Your rash spreads.  You have a fever or chills.  You have new symptoms.  You have new warmth or redness of your skin. This information is not intended to replace advice given to you by your health care provider. Make sure you discuss any questions you have with your health care provider. Document Released: 03/25/2011 Document Revised: 03/02/2016 Document Reviewed: 01/08/2015 Elsevier Interactive Patient Education  [employer].

## 2018-02-17 ENCOUNTER — Telehealth: Payer: Self-pay | Admitting: *Deleted

## 2018-02-17 NOTE — Telephone Encounter (Signed)
Per Dr. Rockey Situ, ok to stop Plavix 7 days for procedure. Recommends to remain on ASA 81 mg daily. Attempted to contact patient at both numbers, messages left at both numbers.

## 2018-02-24 ENCOUNTER — Ambulatory Visit: Payer: Medicare Other | Admitting: Student in an Organized Health Care Education/Training Program

## 2018-03-01 ENCOUNTER — Ambulatory Visit: Payer: Medicare Other | Admitting: Student in an Organized Health Care Education/Training Program

## 2018-03-03 ENCOUNTER — Ambulatory Visit: Payer: Medicare Other | Admitting: Student in an Organized Health Care Education/Training Program

## 2018-03-18 ENCOUNTER — Other Ambulatory Visit: Payer: Self-pay | Admitting: Nurse Practitioner

## 2018-03-24 ENCOUNTER — Telehealth: Payer: Self-pay | Admitting: Nurse Practitioner

## 2018-03-24 ENCOUNTER — Other Ambulatory Visit: Payer: Self-pay | Admitting: Nurse Practitioner

## 2018-03-24 MED ORDER — CYCLOBENZAPRINE HCL 5 MG PO TABS: 5.0000 mg | ORAL_TABLET | Freq: Three times a day (TID) | ORAL | 0 refills | Status: DC | PRN

## 2018-03-24 NOTE — Telephone Encounter (Addendum)
Patient states she is out of meds and needs them sent in to pharmacy,  flexiril and gabapentin.

## 2018-03-24 NOTE — Telephone Encounter (Signed)
Crystal,            This patient did not get any refills of her Flexeril at last med refill appt. You do not prescribe Gabapentin, so I can explain that to her. You did give two refills on the Elavil, but none on the Flexeril. Please advise.  Thanks  --      Anderson [place]

## 2018-03-24 NOTE — Telephone Encounter (Signed)
Sent!

## 2018-03-25 NOTE — Telephone Encounter (Signed)
Called patient and left voicemail that Flexeril had been e-scribed and that she will need to check who prescribed her Gabapentin and call them for refills of that medication.

## 2018-04-15 ENCOUNTER — Encounter: Payer: Self-pay | Admitting: Nurse Practitioner

## 2018-04-15 ENCOUNTER — Ambulatory Visit: Payer: Medicare Other | Attending: Nurse Practitioner | Admitting: Nurse Practitioner

## 2018-04-15 VITALS — BP 136/84 | HR 64 | Temp 98.2°F | Resp 16 | Ht 66.0 in | Wt 142.0 lb

## 2018-04-15 DIAGNOSIS — M542 Cervicalgia: Secondary | ICD-10-CM | POA: Diagnosis not present

## 2018-04-15 DIAGNOSIS — M79601 Pain in right arm: Secondary | ICD-10-CM | POA: Diagnosis not present

## 2018-04-15 DIAGNOSIS — K5730 Diverticulosis of large intestine without perforation or abscess without bleeding: Secondary | ICD-10-CM | POA: Diagnosis not present

## 2018-04-15 DIAGNOSIS — M25511 Pain in right shoulder: Secondary | ICD-10-CM | POA: Insufficient documentation

## 2018-04-15 DIAGNOSIS — F1210 Cannabis abuse, uncomplicated: Secondary | ICD-10-CM | POA: Insufficient documentation

## 2018-04-15 DIAGNOSIS — G629 Polyneuropathy, unspecified: Secondary | ICD-10-CM | POA: Diagnosis not present

## 2018-04-15 DIAGNOSIS — G8929 Other chronic pain: Secondary | ICD-10-CM

## 2018-04-15 DIAGNOSIS — G894 Chronic pain syndrome: Secondary | ICD-10-CM | POA: Diagnosis not present

## 2018-04-15 DIAGNOSIS — I5022 Chronic systolic (congestive) heart failure: Secondary | ICD-10-CM | POA: Diagnosis not present

## 2018-04-15 DIAGNOSIS — M47812 Spondylosis without myelopathy or radiculopathy, cervical region: Secondary | ICD-10-CM

## 2018-04-15 DIAGNOSIS — K648 Other hemorrhoids: Secondary | ICD-10-CM | POA: Insufficient documentation

## 2018-04-15 DIAGNOSIS — I5181 Takotsubo syndrome: Secondary | ICD-10-CM | POA: Insufficient documentation

## 2018-04-15 DIAGNOSIS — M5136 Other intervertebral disc degeneration, lumbar region: Secondary | ICD-10-CM | POA: Diagnosis not present

## 2018-04-15 DIAGNOSIS — I110 Hypertensive heart disease with heart failure: Secondary | ICD-10-CM | POA: Insufficient documentation

## 2018-04-15 DIAGNOSIS — G4733 Obstructive sleep apnea (adult) (pediatric): Secondary | ICD-10-CM | POA: Insufficient documentation

## 2018-04-15 DIAGNOSIS — M79651 Pain in right thigh: Secondary | ICD-10-CM | POA: Insufficient documentation

## 2018-04-15 DIAGNOSIS — M19011 Primary osteoarthritis, right shoulder: Secondary | ICD-10-CM | POA: Insufficient documentation

## 2018-04-15 DIAGNOSIS — I252 Old myocardial infarction: Secondary | ICD-10-CM | POA: Insufficient documentation

## 2018-04-15 DIAGNOSIS — M545 Low back pain: Secondary | ICD-10-CM | POA: Diagnosis not present

## 2018-04-15 DIAGNOSIS — I2510 Atherosclerotic heart disease of native coronary artery without angina pectoris: Secondary | ICD-10-CM | POA: Diagnosis not present

## 2018-04-15 DIAGNOSIS — E1140 Type 2 diabetes mellitus with diabetic neuropathy, unspecified: Secondary | ICD-10-CM | POA: Diagnosis not present

## 2018-04-15 DIAGNOSIS — Z9119 Patient's noncompliance with other medical treatment and regimen: Secondary | ICD-10-CM | POA: Insufficient documentation

## 2018-04-15 DIAGNOSIS — M79652 Pain in left thigh: Secondary | ICD-10-CM | POA: Insufficient documentation

## 2018-04-15 DIAGNOSIS — M25512 Pain in left shoulder: Secondary | ICD-10-CM | POA: Insufficient documentation

## 2018-04-15 DIAGNOSIS — M7542 Impingement syndrome of left shoulder: Secondary | ICD-10-CM | POA: Insufficient documentation

## 2018-04-15 DIAGNOSIS — Z9889 Other specified postprocedural states: Secondary | ICD-10-CM | POA: Insufficient documentation

## 2018-04-15 DIAGNOSIS — E1149 Type 2 diabetes mellitus with other diabetic neurological complication: Secondary | ICD-10-CM | POA: Insufficient documentation

## 2018-04-15 DIAGNOSIS — Z794 Long term (current) use of insulin: Secondary | ICD-10-CM | POA: Insufficient documentation

## 2018-04-15 DIAGNOSIS — E782 Mixed hyperlipidemia: Secondary | ICD-10-CM | POA: Diagnosis not present

## 2018-04-15 DIAGNOSIS — E039 Hypothyroidism, unspecified: Secondary | ICD-10-CM | POA: Insufficient documentation

## 2018-04-15 DIAGNOSIS — F17210 Nicotine dependence, cigarettes, uncomplicated: Secondary | ICD-10-CM | POA: Diagnosis not present

## 2018-04-15 DIAGNOSIS — Z981 Arthrodesis status: Secondary | ICD-10-CM | POA: Diagnosis not present

## 2018-04-15 DIAGNOSIS — J449 Chronic obstructive pulmonary disease, unspecified: Secondary | ICD-10-CM | POA: Insufficient documentation

## 2018-04-15 DIAGNOSIS — M4712 Other spondylosis with myelopathy, cervical region: Secondary | ICD-10-CM | POA: Diagnosis not present

## 2018-04-15 DIAGNOSIS — K219 Gastro-esophageal reflux disease without esophagitis: Secondary | ICD-10-CM | POA: Insufficient documentation

## 2018-04-15 DIAGNOSIS — F329 Major depressive disorder, single episode, unspecified: Secondary | ICD-10-CM | POA: Insufficient documentation

## 2018-04-15 DIAGNOSIS — Z79899 Other long term (current) drug therapy: Secondary | ICD-10-CM | POA: Insufficient documentation

## 2018-04-15 MED ORDER — GABAPENTIN 100 MG PO CAPS: 100.0000 mg | ORAL_CAPSULE | Freq: Three times a day (TID) | ORAL | 0 refills | Status: DC

## 2018-04-15 NOTE — Progress Notes (Signed)
Safety precautions to be maintained throughout the outpatient stay will include: orient to surroundings, keep bed in low position, maintain call bell within reach at all times, provide assistance with transfer out of bed and ambulation.  

## 2018-04-15 NOTE — Progress Notes (Signed)
Patient's Name: Sarah Payne  MRN: [id number]  Referring Provider: [name], NP  DOB: 11-Apr-1946  PCP: [name], NP  DOS: 04/15/2018  Note by: Vevelyn Francois NP  Service setting: Ambulatory outpatient  Specialty: Interventional Pain Management  Location: [facility] (AMB) Pain Management Facility    Patient type: Established    Primary Reason(s) for Visit: Encounter for prescription drug management. (Level of risk: moderate)  CC: Back Pain (lumbar middle) and Shoulder Pain (bilateral s/p rotator cuff repair on Left )  HPI  Sarah Payne is a 72 y.o. year old, female patient, who comes today for a medication management evaluation. She has CAD, multiple vessel; Tobacco abuse; Mixed hyperlipidemia; Hypothyroidism; Depression; History of lumbar fusion; Type II diabetes mellitus with neurological manifestations ([place]); Lichen sclerosus et atrophicus; Bronchitis, chronic obstructive ([place]); Chronic systolic congestive heart failure ([place]); Obstructive sleep apnea; Noncompliance with diabetes treatment; COPD, moderate ([place]); Diverticulosis of colon without hemorrhage; Esophageal reflux; Adjustment disorder with mixed anxiety and depressed mood; Benign neoplasm of colon; Internal hemorrhoids; HTN (hypertension); Acute systolic (congestive) heart failure ([place]); Takotsubo cardiomyopathy; Frequent PVCs; Insomnia; Chronic bilateral back pain; Lumbar degenerative disc disease; Lumbar spondylosis with myelopathy; Cannabis use disorder, mild, abuse; Cognitive changes; Peripheral polyneuropathy; Tobacco dependence; Type 2 diabetes mellitus, uncontrolled ([place]); Neck pain; Right shoulder pain; Chronic pain syndrome; Chronic neck pain; Chronic pain of right upper extremity; and Cervical spondylosis on their problem list. Her primarily concern today is the Back Pain (lumbar middle) and Shoulder Pain (bilateral s/p rotator cuff repair on Left )  Pain Assessment: Location: Lower, Mid Back Radiating: shoulder pain  into right arm causing numbness in first 3 digits.  back pain travels down both legs to about mid thigh and hamstring area.  Onset: More than a month ago Duration: Chronic pain Quality: Discomfort, Numbness, Sharp, Constant Severity: 6 /10 (subjective, self-reported pain score)  Note: Reported level is compatible with observation. Clinically the patient looks like a 1/10 A 1/10 is viewed as "Mild" and described as nagging, annoying, but not interfering with basic activities of daily living (ADL). Sarah Payne is able to eat, bathe, get dressed, do toileting (being able to get on and off the toilet and perform personal hygiene functions), transfer (move in and out of bed or a chair without assistance), and maintain continence (able to control bladder and bowel functions). Physiologic parameters such as blood pressure and heart rate apear wnl. Information on the proper use of the pain scale provided to the patient today. When using our objective Pain Scale, levels between 6 and 10/10 are said to belong in an emergency room, as it progressively worsens from a 6/10, described as severely limiting, requiring emergency care not usually available at an outpatient pain management facility. At a 6/10 level, communication becomes difficult and requires great effort. Assistance to reach the emergency department may be required. Facial flushing and profuse sweating along with potentially dangerous increases in heart rate and blood pressure will be evident. Effect on ADL: barely doing anything at all, does not function well.  unable to household chores  Timing: Constant Modifying factors: patient takes meloxicam and was taking gabapentin as well which had been prescribed to her at the ED.  She is currently out x 1 month and the pain has returned.  BP: 136/84  HR: 64  Sarah Payne was last scheduled for an appointment on 03/24/2018 for medication management. During today's appointment we reviewed Sarah Payne's chronic pain  status, as well as her outpatient  medication regimen. She has numbness in both arms. She has weakness only on occasion when the pain is severe like now since being out of the Gabapentin. She denies any other concerns today.   The patient  reports that she has current or past drug history. Drug: Marijuana. Her body mass index is 22.92 kg/m.   Further details on both, my assessment(s), as well as the proposed treatment plan, please see below.  Laboratory Chemistry  Inflammation Markers (CRP: Acute Phase) (ESR: Chronic Phase) Lab Results  Component Value Date   CRP 2.1 04/11/2013   ESRSEDRATE 58 (H) 04/11/2013                         Rheumatology Markers No results found for: RF, ANA, LABURIC, URICUR, LYMEIGGIGMAB, LYMEABIGMQN, HLAB27                      Renal Function Markers Lab Results  Component Value Date   BUN 12 11/17/2017   CREATININE 0.99 11/17/2017   BCR 19 08/11/2012   GFRAA >60 01/09/2016   GFRNONAA >60 01/09/2016                             Hepatic Function Markers Lab Results  Component Value Date   AST 14 11/17/2017   ALT 12 11/17/2017   ALBUMIN 3.9 11/17/2017   ALKPHOS 76 11/17/2017   HCVAB NEGATIVE 03/12/2015   AMYLASE 32 04/11/2016   LIPASE 55.0 04/11/2016                        Electrolytes Lab Results  Component Value Date   NA 141 11/17/2017   K 3.6 11/17/2017   CL 102 11/17/2017   CALCIUM 9.3 11/17/2017   MG 1.1 (L) 06/12/2014                        Neuropathy Markers Lab Results  Component Value Date   JOITGPQD82 641 10/13/2011   HGBA1C 9.2 (A) 01/18/2018                        CNS Tests No results found for: COLORCSF, APPEARCSF, RBCCOUNTCSF, WBCCSF, POLYSCSF, LYMPHSCSF, EOSCSF, PROTEINCSF, GLUCCSF, JCVIRUS, CSFOLI, IGGCSF                      Bone Pathology Markers Lab Results  Component Value Date   VD25OH 41 02/25/2013                         Coagulation Parameters Lab Results  Component Value Date   INR 1.01 01/09/2016    LABPROT 13.5 01/09/2016   APTT 109 (H) 01/01/2016   PLT 289.0 06/02/2016                        Cardiovascular Markers Lab Results  Component Value Date   BNP 91 08/01/2014   CKTOTAL 272 (H) 08/01/2014   CKMB 4.5 (H) 08/01/2014   TROPONINI <0.03 01/09/2016   HGB 14.7 06/02/2016   HCT 43.7 06/02/2016                         CA Markers No results found for: CEA, CA125, LABCA2  Note: Lab results reviewed.  Recent Diagnostic Imaging Results  DG Shoulder Right CLINICAL DATA:  Neck pain radiating into the right shoulder and right hand with numbness and tingling for the past week. No report of injury.  EXAM: RIGHT SHOULDER - 2+ VIEW  COMPARISON:  No recent studies in [facility]  FINDINGS: The bones are subjectively adequately mineralized. There is minimal spurring from the inferior lip of the glenoid. The joint spaces reasonably well-maintained. The [facility] joint exhibits mild narrowing without significant osteophyte formation. The subacromial subdeltoid space is normal.  IMPRESSION: Mild degenerative change of the glenohumeral and AC joints without significant joint space loss. No acute fracture or dislocation.  Electronically Signed   By: David  Martinique M.D.   On: 01/19/2018 13:18 DG Cervical Spine With Flex & Extend CLINICAL DATA:  Neck pain radiating into the right shoulder with right hand numbness and tingling for the past week. No known injury.  EXAM: CERVICAL SPINE COMPLETE WITH FLEXION AND EXTENSION VIEWS  COMPARISON:  MRI of the cervical spine of October 15, 2006  FINDINGS: The cervical vertebral bodies are preserved in height. There is mild disc space narrowing at C4-5, C5-6, and C6-7. There are anterior endplate osteophytes at these levels. There is mild facet joint hypertrophy. The spinous processes are intact. As the patient moves from the neutral to the flexed and to the extended positions. There is no significant instability of the vertebral  bodies. The odontoid is intact. The prevertebral soft tissue spaces are normal.  IMPRESSION: Mild multilevel degenerative disc and facet joint change. No evidence of ligamentous instability. No acute bony abnormality.  Electronically Signed   By: David  Martinique M.D.   On: 01/19/2018 13:17  Complexity Note: Imaging results reviewed. Results shared with Sarah Payne, using Layman's terms.                         Meds   Current Outpatient Medications:  .  amitriptyline (ELAVIL) 50 MG tablet, Take 1 tablet (50 mg total) by mouth at bedtime., Disp: 30 tablet, Rfl: 2 .  aspirin 81 MG tablet, Take 81 mg by mouth daily.  , Disp: , Rfl:  .  carvedilol (COREG) 3.125 MG tablet, Take 2 tablets (6.25 mg total) by mouth 2 (two) times daily with a meal., Disp: 360 tablet, Rfl: 3 .  clopidogrel (PLAVIX) 75 MG tablet, Take 75 mg by mouth daily., Disp: , Rfl:  .  cyclobenzaprine (FLEXERIL) 5 MG tablet, Take 1 tablet (5 mg total) by mouth 3 (three) times daily as needed for muscle spasms., Disp: 90 tablet, Rfl: 0 .  enalapril (VASOTEC) 20 MG tablet, Take 1 tablet by mouth once daily for blood pressure., Disp: 90 tablet, Rfl: 0 .  ezetimibe (ZETIA) 10 MG tablet, Take 1 tablet (10 mg total) by mouth daily., Disp: 90 tablet, Rfl: 3 .  glucose blood (FREESTYLE LITE) test strip, USE 1 STRIP TO CHECK BLOOD SUGAR 5 TIMES DAILY FOR UNCONTROLLED DIABETES MELLITUS, Disp: 400 each, Rfl: 5 .  glucose monitoring kit (FREESTYLE) monitoring kit, 1 each by Does not apply route as needed for other. PHARMACY PLEASE SUBSTITUTE WHATEVER GLUCOMETER YOU CARRY, Disp: 1 each, Rfl: 1 .  insulin aspart (NOVOLOG FLEXPEN) 100 UNIT/ML FlexPen, Inject 14-22 Units into the skin 3 (three) times daily with meals. Normally takes 12 units, Disp: 15 pen, Rfl: 3 .  Insulin Glargine (LANTUS SOLOSTAR) 100 UNIT/ML Solostar Pen, Inject 40 Units into the skin at bedtime., Disp: 15  pen, Rfl: 3 .  Insulin Pen Needle (INSUPEN PEN NEEDLES) 32G X 4 MM MISC,  Use to inject insulin 4 times daily., Disp: 250 each, Rfl: 1 .  isosorbide mononitrate (IMDUR) 30 MG 24 hr tablet, Take 1 tablet (30 mg total) by mouth daily., Disp: 90 tablet, Rfl: 3 .  ketorolac (TORADOL) 10 MG tablet, Take 1 tablet (10 mg total) by mouth every 8 (eight) hours., Disp: 15 tablet, Rfl: 0 .  levothyroxine (SYNTHROID, LEVOTHROID) 125 MCG tablet, Take 1 tablet (125 mcg total) by mouth daily., Disp: 90 tablet, Rfl: 3 .  nystatin cream (MYCOSTATIN), Apply 1 application topically 2 (two) times daily. Apply a thin layer to clean and dry skin for up to 3 weeks., Disp: 30 g, Rfl: 0 .  ondansetron (ZOFRAN-ODT) 8 MG disintegrating tablet, Take 1 tablet (8 mg total) by mouth every 8 (eight) hours as needed for nausea., Disp: 20 tablet, Rfl: 0 .  pantoprazole (PROTONIX) 40 MG tablet, Take 1 tablet (40 mg total) by mouth daily., Disp: 90 tablet, Rfl: 3 .  rosuvastatin (CRESTOR) 40 MG tablet, Take 1 tablet (40 mg total) by mouth daily., Disp: 90 tablet, Rfl: 3 .  sitaGLIPtin (JANUVIA) 100 MG tablet, Take 1 tablet (100 mg total) by mouth daily., Disp: 90 tablet, Rfl: 3 .  traZODone (DESYREL) 100 MG tablet, Take 1 tablet (100 mg total) by mouth at bedtime., Disp: 90 tablet, Rfl: 1 .  gabapentin (NEURONTIN) 100 MG capsule, Take 1 capsule (100 mg total) by mouth every 8 (eight) hours., Disp: 90 capsule, Rfl: 0 .  gabapentin (NEURONTIN) 300 MG capsule, Take 1 capsule (300 mg total) by mouth 3 (three) times daily., Disp: 90 capsule, Rfl: 0  ROS  Constitutional: Denies any fever or chills Gastrointestinal: No reported hemesis, hematochezia, vomiting, or acute GI distress Musculoskeletal: Denies any acute onset joint swelling, redness, loss of ROM, or weakness Neurological: No reported episodes of acute onset apraxia, aphasia, dysarthria, agnosia, amnesia, paralysis, loss of coordination, or loss of consciousness  Allergies  Sarah Payne is allergic to no known allergies.  [place]  Drug: Sarah Payne   reports that she has current or past drug history. Drug: Marijuana. Alcohol:  reports that she does not drink alcohol. Tobacco:  reports that she has been smoking cigarettes. She has a 45.00 pack-year smoking history. She has never used smokeless tobacco. Medical:  has a past medical history of Cataract, Chronic low back pain, COPD (chronic obstructive pulmonary disease) ([place]), Coronary artery disease, Depression, Diabetes mellitus, Diverticulosis, Frequent PVCs, GERD (gastroesophageal reflux disease), Hyperlipidemia, Hypertension, Hypothyroidism, Impingement syndrome of left shoulder, Marijuana abuse, Myocardial infarction [facility]), Sleep apnea, Takotsubo cardiomyopathy, Tendonitis of left rotator cuff, Tobacco abuse, and Vascular dementia. Surgical: Sarah Payne  has a past surgical history that includes Back surgery; Appendectomy; Abdominal hysterectomy; Colonoscopy (N/A, 11/02/2014); Cataract extraction w/PHACO (Left, 01/30/2015); Cardiac catheterization (2013); Coronary angioplasty (2012); Coronary angioplasty with stent (2013); Cardiac catheterization (2016); Cataract extraction w/PHACO (Right, 02/13/2015); and Cardiac catheterization (N/A, 01/01/2016). Family: family history includes Colon cancer in her sister; Heart attack in her mother; Heart disease in her father and mother; Liver cancer in her brother; Throat cancer in her brother.  Constitutional Exam  General appearance: Well nourished, well developed, and well hydrated. In no apparent acute distress Vitals:   04/15/18 0815  BP: 136/84  Pulse: 64  Resp: 16  Temp: 98.2 F (36.8 C)  TempSrc: Oral  SpO2: 100%  Weight: 142 lb (64.4 kg)  Height: 5' 6"  (1.676 m)  BMI Assessment: Estimated body mass index is 22.92 kg/m as calculated from the following:   Height as of this encounter: 5' 6"  (1.676 m).   Weight as of this encounter: 142 lb (64.4 kg). Psych/Mental status: Alert, oriented x 3 (person, place, & time)       Eyes: PERLA Respiratory:  No evidence of acute respiratory distress  Cervical Spine Area Exam  Skin & Axial Inspection: No masses, redness, edema, swelling, or associated skin lesions Alignment: Symmetrical Functional ROM: Unrestricted ROM      Stability: No instability detected Muscle Tone/Strength: Functionally intact. No obvious neuro-muscular anomalies detected. Sensory (Neurological): Unimpaired Palpation: No palpable anomalies              Upper Extremity (UE) Exam    Side: Right upper extremity  Side: Left upper extremity  Skin & Extremity Inspection: Skin color, temperature, and hair growth are WNL. No peripheral edema or cyanosis. No masses, redness, swelling, asymmetry, or associated skin lesions. No contractures.  Skin & Extremity Inspection: Skin color, temperature, and hair growth are WNL. No peripheral edema or cyanosis. No masses, redness, swelling, asymmetry, or associated skin lesions. No contractures.  Functional ROM: Unrestricted ROM          Functional ROM: Unrestricted ROM          Muscle Tone/Strength: Functionally intact. No obvious neuro-muscular anomalies detected.  Muscle Tone/Strength: Functionally intact. No obvious neuro-muscular anomalies detected.  Sensory (Neurological): Unimpaired          Sensory (Neurological): Unimpaired          Palpation: No palpable anomalies              Palpation: No palpable anomalies              Provocative Test(s):  Phalen's test: deferred Tinel's test: deferred Apley's scratch test (touch opposite shoulder):  Action 1 (Across chest): deferred Action 2 (Overhead): deferred Action 3 (LB reach): deferred   Provocative Test(s):  Phalen's test: deferred Tinel's test: deferred Apley's scratch test (touch opposite shoulder):  Action 1 (Across chest): deferred Action 2 (Overhead): deferred Action 3 (LB reach): deferred    Gait & Posture Assessment  Ambulation: Unassisted Gait: Relatively normal for age and body habitus Posture: WNL   Lower Extremity  Exam    Side: Right lower extremity  Side: Left lower extremity  Stability: No instability observed          Stability: No instability observed          Skin & Extremity Inspection: Skin color, temperature, and hair growth are WNL. No peripheral edema or cyanosis. No masses, redness, swelling, asymmetry, or associated skin lesions. No contractures.  Skin & Extremity Inspection: Skin color, temperature, and hair growth are WNL. No peripheral edema or cyanosis. No masses, redness, swelling, asymmetry, or associated skin lesions. No contractures.  Functional ROM: Unrestricted ROM                  Functional ROM: Unrestricted ROM                  Muscle Tone/Strength: Functionally intact. No obvious neuro-muscular anomalies detected.  Muscle Tone/Strength: Functionally intact. No obvious neuro-muscular anomalies detected.  Sensory (Neurological): Unimpaired  Sensory (Neurological): Unimpaired  Palpation: No palpable anomalies  Palpation: No palpable anomalies   Assessment  Primary Diagnosis & Pertinent Problem List: The primary encounter diagnosis was Osteoarthritis of cervical spine, unspecified spinal osteoarthritis complication status. Diagnoses of Chronic pain of right upper extremity,  Peripheral polyneuropathy, and Chronic pain syndrome were also pertinent to this visit.  Status Diagnosis  Persistent Worsening Worsening 1. Osteoarthritis of cervical spine, unspecified spinal osteoarthritis complication status   2. Chronic pain of right upper extremity   3. Peripheral polyneuropathy   4. Chronic pain syndrome     Problems updated and reviewed during this visit: No problems updated. Plan of Care  Pharmacotherapy (Medications Ordered): Meds ordered this encounter  Medications  . gabapentin (NEURONTIN) 100 MG capsule    Sig: Take 1 capsule (100 mg total) by mouth every 8 (eight) hours.    Dispense:  90 capsule    Refill:  0    Do not place this medication, or any other prescription from  our practice, on "Automatic Refill". Patient may have prescription filled one day early if pharmacy is closed on scheduled refill date.    Order Specific Question:   Supervising Provider    Answer:   Milinda Pointer [phone]   New Prescriptions   GABAPENTIN (NEURONTIN) 100 MG CAPSULE    Take 1 capsule (100 mg total) by mouth every 8 (eight) hours.   Medications administered today: Sarah Payne had no medications administered during this visit. Lab-work, procedure(s), and/or referral(s): No orders of the defined types were placed in this encounter.  Imaging and/or referral(s): None  Interventional therapies: Planned, scheduled, and/or pending:   Not at this time.   Provider-requested follow-up: Return for Appointment As Scheduled.  Future Appointments  Date Time Provider [place]  05/04/2018 10:15 AM Philemon Kingdom, MD [facility] None  05/12/2018  9:30 AM Vevelyn Francois, NP [facility] None  11/24/2018 10:00 AM Eustace Pen, LPN [facility]  08/26/6387  2:20 PM [name], NP [facility]   Primary Care Physician: [name], NP Location: [facility] Outpatient [facility] Management Facility Note by: Vevelyn Francois NP Date: 04/15/2018; Time: 9:28 AM  Pain Score Disclaimer: We use the NRS-11 scale. This is a self-reported, subjective measurement of pain severity with only modest accuracy. It is used primarily to identify changes within a particular patient. It must be understood that outpatient pain scales are significantly less accurate that those used for research, where they can be applied under ideal controlled circumstances with minimal exposure to variables. In reality, the score is likely to be a combination of pain intensity and pain affect, where pain affect describes the degree of emotional arousal or changes in action readiness caused by the sensory experience of pain. Factors such as social and work situation, setting, emotional state, anxiety  levels, expectation, and prior pain experience may influence pain perception and show large inter-individual differences that may also be affected by time variables.  Patient instructions provided during this appointment: Patient Instructions   ____________________________________________________________________________________________  Medication Rules  Applies to: All patients receiving prescriptions (written or electronic).  Pharmacy of record: Pharmacy where electronic prescriptions will be sent. If written prescriptions are taken to a different pharmacy, please inform the nursing staff. The pharmacy listed in the electronic medical record should be the one where you would like electronic prescriptions to be sent.  Prescription refills: Only during scheduled appointments. Applies to both, written and electronic prescriptions.  NOTE: The following applies primarily to controlled substances (Opioid* Pain Medications).   Patient's responsibilities: 1. Pain Pills: Bring all pain pills to every appointment (except for procedure appointments). 2. Pill Bottles: Bring pills in original pharmacy bottle. Always bring newest bottle. Bring bottle, even if empty. 3. Medication refills: You are responsible  for knowing and keeping track of what medications you need refilled. The day before your appointment, write a list of all prescriptions that need to be refilled. Bring that list to your appointment and give it to the admitting nurse. Prescriptions will be written only during appointments. If you forget a medication, it will not be "Called in", "Faxed", or "electronically sent". You will need to get another appointment to get these prescribed. 4. Prescription Accuracy: You are responsible for carefully inspecting your prescriptions before leaving our office. Have the discharge nurse carefully go over each prescription with you, before taking them home. Make sure that your name is accurately spelled, that  your address is correct. Check the name and dose of your medication to make sure it is accurate. Check the number of pills, and the written instructions to make sure they are clear and accurate. Make sure that you are given enough medication to last until your next medication refill appointment. 5. Taking Medication: Take medication as prescribed. Never take more pills than instructed. Never take medication more frequently than prescribed. Taking less pills or less frequently is permitted and encouraged, when it comes to controlled substances (written prescriptions).  6. Inform other Doctors: Always inform, all of your healthcare providers, of all the medications you take. 7. Pain Medication from other Providers: You are not allowed to accept any additional pain medication from any other Doctor or Healthcare provider. There are two exceptions to this rule. (see below) In the event that you require additional pain medication, you are responsible for notifying us, as stated below. 8. Medication Agreement: You are responsible for carefully reading and following our Medication Agreement. This must be signed before receiving any prescriptions from our practice. Safely store a copy of your signed Agreement. Violations to the Agreement will result in no further prescriptions. (Additional copies of our Medication Agreement are available upon request.) 9. Laws, Rules, & Regulations: All patients are expected to follow all Federal and [employer], Rules, [employer]. Ignorance of the Laws does not constitute a valid excuse. The use of any illegal substances is prohibited. 10. Adopted CDC guidelines & recommendations: Target dosing levels will be at or below 60 MME/day. Use of benzodiazepines** is not recommended.  Exceptions: There are only two exceptions to the rule of not receiving pain medications from other Healthcare Providers. 1. Exception #1 (Emergencies): In the event of an emergency (i.e.:  accident requiring emergency care), you are allowed to receive additional pain medication. However, you are responsible for: As soon as you are able, call our office [phone], at any time of the day or night, and leave a message stating your name, the date and nature of the emergency, and the name and dose of the medication prescribed. In the event that your call is answered by a member of our staff, make sure to document and save the date, time, and the name of the person that took your information.  2. Exception #2 (Planned Surgery): In the event that you are scheduled by another doctor or dentist to have any type of surgery or procedure, you are allowed (for a period no longer than 30 days), to receive additional pain medication, for the acute post-op pain. However, in this case, you are responsible for picking up a copy of our "Post-op Pain Management for Surgeons" handout, and giving it to your surgeon or dentist. This document is available at our office, and does not require an appointment to obtain it. Simply go to  our office during business hours (Monday-Thursday from 8:00 AM to 4:00 PM) (Friday 8:00 AM to 12:00 Noon) or if you have a scheduled appointment with [place], prior to your surgery, and ask for it by name. In addition, you will need to provide [place] with your name, name of your surgeon, type of surgery, and date of procedure or surgery.  *Opioid medications include: morphine, codeine, oxycodone, oxymorphone, hydrocodone, hydromorphone, meperidine, tramadol, tapentadol, buprenorphine, fentanyl, methadone. **Benzodiazepine medications include: diazepam (Valium), alprazolam (Xanax), clonazepam (Klonopine), lorazepam (Ativan), clorazepate (Tranxene), chlordiazepoxide (Librium), estazolam (Prosom), oxazepam (Serax), temazepam (Restoril), triazolam (Halcion) (Last updated:  09/17/2017) ____________________________________________________________________________________________   ____________________________________________________________________________________________  Pain Scale  Introduction: The pain score used by this practice is the Verbal Numerical Rating Scale (VNRS-11). This is an 11-point scale. It is for adults and children 10 years or older. There are significant differences in how the pain score is reported, used, and applied. Forget everything you learned in the past and learn this scoring system.  General Information: The scale should reflect your current level of pain. Unless you are specifically asked for the level of your worst pain, or your average pain. If you are asked for one of these two, then it should be understood that it is over the past 24 hours.  Basic Activities of Daily Living (ADL): Personal hygiene, dressing, eating, transferring, and using restroom.  Instructions: Most patients tend to report their level of pain as a combination of two factors, their physical pain and their psychosocial pain. This last one is also known as "suffering" and it is reflection of how physical pain affects you socially and psychologically. From now on, report them separately. From this point on, when asked to report your pain level, report only your physical pain. Use the following table for reference.  Pain Clinic Pain Levels (0-5/10)  Pain Level Score  Description  No Pain 0   Mild pain 1 Nagging, annoying, but does not interfere with basic activities of daily living (ADL). Patients are able to eat, bathe, get dressed, toileting (being able to get on and off the toilet and perform personal hygiene functions), transfer (move in and out of bed or a chair without assistance), and maintain continence (able to control bladder and bowel functions). Blood pressure and heart rate are unaffected. A normal heart rate for a healthy adult ranges from 60 to 100 bpm  (beats per minute).   Mild to moderate pain 2 Noticeable and distracting. Impossible to hide from other people. More frequent flare-ups. Still possible to adapt and function close to normal. It can be very annoying and may have occasional stronger flare-ups. With discipline, patients may get used to it and adapt.   Moderate pain 3 Interferes significantly with activities of daily living (ADL). It becomes difficult to feed, bathe, get dressed, get on and off the toilet or to perform personal hygiene functions. Difficult to get in and out of bed or a chair without assistance. Very distracting. With effort, it can be ignored when deeply involved in activities.   Moderately severe pain 4 Impossible to ignore for more than a few minutes. With effort, patients may still be able to manage work or participate in some social activities. Very difficult to concentrate. Signs of autonomic nervous system discharge are evident: dilated pupils (mydriasis); mild sweating (diaphoresis); sleep interference. Heart rate becomes elevated (>115 bpm). Diastolic blood pressure (lower number) rises above 100 mmHg. Patients find relief in laying down and not moving.   Severe pain 5 Intense and  extremely unpleasant. Associated with frowning face and frequent crying. Pain overwhelms the senses.  Ability to do any activity or maintain social relationships becomes significantly limited. Conversation becomes difficult. Pacing back and forth is common, as getting into a comfortable position is nearly impossible. Pain wakes you up from deep sleep. Physical signs will be obvious: pupillary dilation; increased sweating; goosebumps; brisk reflexes; cold, clammy hands and feet; nausea, vomiting or dry heaves; loss of appetite; significant sleep disturbance with inability to fall asleep or to remain asleep. When persistent, significant weight loss is observed due to the complete loss of appetite and sleep deprivation.  Blood pressure and heart  rate becomes significantly elevated. Caution: If elevated blood pressure triggers a pounding headache associated with blurred vision, then the patient should immediately seek attention at an urgent or emergency care unit, as these may be signs of an impending stroke.    Emergency Department Pain Levels (6-10/10)  Emergency Room Pain 6 Severely limiting. Requires emergency care and should not be seen or managed at an outpatient pain management facility. Communication becomes difficult and requires great effort. Assistance to reach the emergency department may be required. Facial flushing and profuse sweating along with potentially dangerous increases in heart rate and blood pressure will be evident.   Distressing pain 7 Self-care is very difficult. Assistance is required to transport, or use restroom. Assistance to reach the emergency department will be required. Tasks requiring coordination, such as bathing and getting dressed become very difficult.   Disabling pain 8 Self-care is no longer possible. At this level, pain is disabling. The individual is unable to do even the most "basic" activities such as walking, eating, bathing, dressing, transferring to a bed, or toileting. Fine motor skills are lost. It is difficult to think clearly.   Incapacitating pain 9 Pain becomes incapacitating. Thought processing is no longer possible. Difficult to remember your own name. Control of movement and coordination are lost.   The worst pain imaginable 10 At this level, most patients pass out from pain. When this level is reached, collapse of the autonomic nervous system occurs, leading to a sudden drop in blood pressure and heart rate. This in turn results in a temporary and dramatic drop in blood flow to the brain, leading to a loss of consciousness. Fainting is one of the body's self defense mechanisms. Passing out puts the brain in a calmed state and causes it to shut down for a while, in order to begin the  healing process.    Summary: 1. Refer to this scale when providing [place] with your pain level. 2. Be accurate and careful when reporting your pain level. This will help with your care. 3. Over-reporting your pain level will lead to loss of credibility. 4. Even a level of 1/10 means that there is pain and will be treated at our facility. 5. High, inaccurate reporting will be documented as "Symptom Exaggeration", leading to loss of credibility and suspicions of possible secondary gains such as obtaining more narcotics, or wanting to appear disabled, for fraudulent reasons. 6. Only pain levels of 5 or below will be seen at our facility. 7. Pain levels of 6 and above will be sent to the Emergency Department and the appointment cancelled. ____________________________________________________________________________________________   Gabapentin 100 mg 1 capsule three times per day escribed to pharmacy

## 2018-04-15 NOTE — Patient Instructions (Addendum)
____________________________________________________________________________________________  Medication Rules  Applies to: All patients receiving prescriptions (written or electronic).  Pharmacy of record: Pharmacy where electronic prescriptions will be sent. If written prescriptions are taken to a different pharmacy, please inform the nursing staff. The pharmacy listed in the electronic medical record should be the one where you would like electronic prescriptions to be sent.  Prescription refills: Only during scheduled appointments. Applies to both, written and electronic prescriptions.  NOTE: The following applies primarily to controlled substances (Opioid* Pain Medications).   Patient's responsibilities: 1. Pain Pills: Bring all pain pills to every appointment (except for procedure appointments). 2. Pill Bottles: Bring pills in original pharmacy bottle. Always bring newest bottle. Bring bottle, even if empty. 3. Medication refills: You are responsible for knowing and keeping track of what medications you need refilled. The day before your appointment, write a list of all prescriptions that need to be refilled. Bring that list to your appointment and give it to the admitting nurse. Prescriptions will be written only during appointments. If you forget a medication, it will not be "Called in", "Faxed", or "electronically sent". You will need to get another appointment to get these prescribed. 4. Prescription Accuracy: You are responsible for carefully inspecting your prescriptions before leaving our office. Have the discharge nurse carefully go over each prescription with you, before taking them home. Make sure that your name is accurately spelled, that your address is correct. Check the name and dose of your medication to make sure it is accurate. Check the number of pills, and the written instructions to make sure they are clear and accurate. Make sure that you are given enough medication to last  until your next medication refill appointment. 5. Taking Medication: Take medication as prescribed. Never take more pills than instructed. Never take medication more frequently than prescribed. Taking less pills or less frequently is permitted and encouraged, when it comes to controlled substances (written prescriptions).  6. Inform other Doctors: Always inform, all of your healthcare providers, of all the medications you take. 7. Pain Medication from other Providers: You are not allowed to accept any additional pain medication from any other Doctor or Healthcare provider. There are two exceptions to this rule. (see below) In the event that you require additional pain medication, you are responsible for notifying us, as stated below. 8. Medication Agreement: You are responsible for carefully reading and following our Medication Agreement. This must be signed before receiving any prescriptions from our practice. Safely store a copy of your signed Agreement. Violations to the Agreement will result in no further prescriptions. (Additional copies of our Medication Agreement are available upon request.) 9. Laws, Rules, & Regulations: All patients are expected to follow all Federal and State Laws, Statutes, Rules, & Regulations. Ignorance of the Laws does not constitute a valid excuse. The use of any illegal substances is prohibited. 10. Adopted CDC guidelines & recommendations: Target dosing levels will be at or below 60 MME/day. Use of benzodiazepines** is not recommended.  Exceptions: There are only two exceptions to the rule of not receiving pain medications from other Healthcare Providers. 1. Exception #1 (Emergencies): In the event of an emergency (i.e.: accident requiring emergency care), you are allowed to receive additional pain medication. However, you are responsible for: As soon as you are able, call our office [phone], at any time of the day or night, and leave a message stating your name, the  date and nature of the emergency, and the name and dose of the medication   prescribed. In the event that your call is answered by a member of our staff, make sure to document and save the date, time, and the name of the person that took your information.  2. Exception #2 (Planned Surgery): In the event that you are scheduled by another doctor or dentist to have any type of surgery or procedure, you are allowed (for a period no longer than 30 days), to receive additional pain medication, for the acute post-op pain. However, in this case, you are responsible for picking up a copy of our "Post-op Pain Management for Surgeons" handout, and giving it to your surgeon or dentist. This document is available at our office, and does not require an appointment to obtain it. Simply go to our office during business hours (Monday-Thursday from 8:00 AM to 4:00 PM) (Friday 8:00 AM to 12:00 Noon) or if you have a scheduled appointment with us, prior to your surgery, and ask for it by name. In addition, you will need to provide us with your name, name of your surgeon, type of surgery, and date of procedure or surgery.  *Opioid medications include: morphine, codeine, oxycodone, oxymorphone, hydrocodone, hydromorphone, meperidine, tramadol, tapentadol, buprenorphine, fentanyl, methadone. **Benzodiazepine medications include: diazepam (Valium), alprazolam (Xanax), clonazepam (Klonopine), lorazepam (Ativan), clorazepate (Tranxene), chlordiazepoxide (Librium), estazolam (Prosom), oxazepam (Serax), temazepam (Restoril), triazolam (Halcion) (Last updated: 09/17/2017) ____________________________________________________________________________________________   ____________________________________________________________________________________________  Pain Scale  Introduction: The pain score used by this practice is the Verbal Numerical Rating Scale (VNRS-11). This is an 11-point scale. It is for adults and children 10 years or  older. There are significant differences in how the pain score is reported, used, and applied. Forget everything you learned in the past and learn this scoring system.  General Information: The scale should reflect your current level of pain. Unless you are specifically asked for the level of your worst pain, or your average pain. If you are asked for one of these two, then it should be understood that it is over the past 24 hours.  Basic Activities of Daily Living (ADL): Personal hygiene, dressing, eating, transferring, and using restroom.  Instructions: Most patients tend to report their level of pain as a combination of two factors, their physical pain and their psychosocial pain. This last one is also known as "suffering" and it is reflection of how physical pain affects you socially and psychologically. From now on, report them separately. From this point on, when asked to report your pain level, report only your physical pain. Use the following table for reference.  Pain Clinic Pain Levels (0-5/10)  Pain Level Score  Description  No Pain 0   Mild pain 1 Nagging, annoying, but does not interfere with basic activities of daily living (ADL). Patients are able to eat, bathe, get dressed, toileting (being able to get on and off the toilet and perform personal hygiene functions), transfer (move in and out of bed or a chair without assistance), and maintain continence (able to control bladder and bowel functions). Blood pressure and heart rate are unaffected. A normal heart rate for a healthy adult ranges from 60 to 100 bpm (beats per minute).   Mild to moderate pain 2 Noticeable and distracting. Impossible to hide from other people. More frequent flare-ups. Still possible to adapt and function close to normal. It can be very annoying and may have occasional stronger flare-ups. With discipline, patients may get used to it and adapt.   Moderate pain 3 Interferes significantly with activities of daily  living (ADL).   It becomes difficult to feed, bathe, get dressed, get on and off the toilet or to perform personal hygiene functions. Difficult to get in and out of bed or a chair without assistance. Very distracting. With effort, it can be ignored when deeply involved in activities.   Moderately severe pain 4 Impossible to ignore for more than a few minutes. With effort, patients may still be able to manage work or participate in some social activities. Very difficult to concentrate. Signs of autonomic nervous system discharge are evident: dilated pupils (mydriasis); mild sweating (diaphoresis); sleep interference. Heart rate becomes elevated (>115 bpm). Diastolic blood pressure (lower number) rises above 100 mmHg. Patients find relief in laying down and not moving.   Severe pain 5 Intense and extremely unpleasant. Associated with frowning face and frequent crying. Pain overwhelms the senses.  Ability to do any activity or maintain social relationships becomes significantly limited. Conversation becomes difficult. Pacing back and forth is common, as getting into a comfortable position is nearly impossible. Pain wakes you up from deep sleep. Physical signs will be obvious: pupillary dilation; increased sweating; goosebumps; brisk reflexes; cold, clammy hands and feet; nausea, vomiting or dry heaves; loss of appetite; significant sleep disturbance with inability to fall asleep or to remain asleep. When persistent, significant weight loss is observed due to the complete loss of appetite and sleep deprivation.  Blood pressure and heart rate becomes significantly elevated. Caution: If elevated blood pressure triggers a pounding headache associated with blurred vision, then the patient should immediately seek attention at an urgent or emergency care unit, as these may be signs of an impending stroke.    Emergency Department Pain Levels (6-10/10)  Emergency Room Pain 6 Severely limiting. Requires emergency care  and should not be seen or managed at an outpatient pain management facility. Communication becomes difficult and requires great effort. Assistance to reach the emergency department may be required. Facial flushing and profuse sweating along with potentially dangerous increases in heart rate and blood pressure will be evident.   Distressing pain 7 Self-care is very difficult. Assistance is required to transport, or use restroom. Assistance to reach the emergency department will be required. Tasks requiring coordination, such as bathing and getting dressed become very difficult.   Disabling pain 8 Self-care is no longer possible. At this level, pain is disabling. The individual is unable to do even the most "basic" activities such as walking, eating, bathing, dressing, transferring to a bed, or toileting. Fine motor skills are lost. It is difficult to think clearly.   Incapacitating pain 9 Pain becomes incapacitating. Thought processing is no longer possible. Difficult to remember your own name. Control of movement and coordination are lost.   The worst pain imaginable 10 At this level, most patients pass out from pain. When this level is reached, collapse of the autonomic nervous system occurs, leading to a sudden drop in blood pressure and heart rate. This in turn results in a temporary and dramatic drop in blood flow to the brain, leading to a loss of consciousness. Fainting is one of the body's self defense mechanisms. Passing out puts the brain in a calmed state and causes it to shut down for a while, in order to begin the healing process.    Summary: 1. Refer to this scale when providing us with your pain level. 2. Be accurate and careful when reporting your pain level. This will help with your care. 3. Over-reporting your pain level will lead to loss of credibility. 4. Even   a level of 1/10 means that there is pain and will be treated at our facility. 5. High, inaccurate reporting will be  documented as "Symptom Exaggeration", leading to loss of credibility and suspicions of possible secondary gains such as obtaining more narcotics, or wanting to appear disabled, for fraudulent reasons. 6. Only pain levels of 5 or below will be seen at our facility. 7. Pain levels of 6 and above will be sent to the Emergency Department and the appointment cancelled. ____________________________________________________________________________________________   Gabapentin 100 mg 1 capsule three times per day escribed to pharmacy

## 2018-04-21 ENCOUNTER — Ambulatory Visit: Payer: Medicare Other | Admitting: Nurse Practitioner

## 2018-05-04 ENCOUNTER — Ambulatory Visit (INDEPENDENT_AMBULATORY_CARE_PROVIDER_SITE_OTHER): Payer: Medicare Other | Admitting: Internal Medicine

## 2018-05-04 ENCOUNTER — Encounter: Payer: Self-pay | Admitting: Internal Medicine

## 2018-05-04 VITALS — BP 160/100 | HR 102 | Ht 66.0 in | Wt 145.0 lb

## 2018-05-04 DIAGNOSIS — E1149 Type 2 diabetes mellitus with other diabetic neurological complication: Secondary | ICD-10-CM | POA: Diagnosis not present

## 2018-05-04 DIAGNOSIS — I2510 Atherosclerotic heart disease of native coronary artery without angina pectoris: Secondary | ICD-10-CM | POA: Diagnosis not present

## 2018-05-04 DIAGNOSIS — E782 Mixed hyperlipidemia: Secondary | ICD-10-CM

## 2018-05-04 DIAGNOSIS — Z23 Encounter for immunization: Secondary | ICD-10-CM

## 2018-05-04 DIAGNOSIS — E039 Hypothyroidism, unspecified: Secondary | ICD-10-CM

## 2018-05-04 LAB — POCT GLYCOSYLATED HEMOGLOBIN (HGB A1C): Hemoglobin A1C: 10.2 % — AB (ref 4.0–5.6)

## 2018-05-04 MED ORDER — INSULIN GLARGINE 100 UNIT/ML SOLOSTAR PEN: 50.0000 [IU] | PEN_INJECTOR | Freq: Every day | SUBCUTANEOUS | 3 refills | Status: DC

## 2018-05-04 NOTE — Addendum Note (Signed)
Addended by: Cardell Peach I on: 05/04/2018 01:53 PM   Modules accepted: Orders

## 2018-05-04 NOTE — Patient Instructions (Addendum)
Please increase: - Lantus 50 units at bedtime (if not improving, increase to 55-60 units)  Please use: - Novolog 10-14 units before coffee  Increase: - NovoLog 14 units before lunch 18 units before dinner  If sugars before the meal are: 70 or lower, do not take insulin before that meal 71- 99, take no more than 12 units with that meal 100 or above, take the whole dose of insulin  Do not skip any insulin doses.   Please schedule a new eye exam.  Please return in 3 months with your sugar log.

## 2018-05-04 NOTE — Progress Notes (Signed)
Patient ID: Sarah Payne, female   DOB: [birth year], 72 y.o.   MRN: [id number]  HPI: Sarah Payne is a 72 y.o.-year-old female, returning for f/u for DM2, dx 2003, insulin-dependent since 2013, uncontrolled, with complications (CAD, sCHF, PAD, CKD, PN) and also complicated by dementia and medication noncompliance, also hypothyroidism, uncontrolled. Last visit 3.5 months ago.  She again tells me that she ran out of Lantus for approximately a week >> sugars very high at that time, and they did not improve yet after she restarted it a week ago.  DM2: Last hemoglobin A1c: Lab Results  Component Value Date   HGBA1C 9.2 (A) 01/18/2018   HGBA1C 10.2 10/16/2017   HGBA1C 10.8 07/17/2017   Pt is on a regimen of: - Lantus 40 units at bedtime >> out for 1 week, getting Novolog 18 units at bedtime then - now back on 40 units of Lantus  - NovoLog 14 units before a smaller meal 15 units before a larger meal or if you have dessert  If sugars before the meal are: 70 or lower, do not take insulin before that meal 71- 99, take no more than 10 units with that meal 100 or above, take the whole dose of insulin  Freestyle Libre CGM parameters: - average: 254+/-78.4 - coefficient of variation: 30.9% (19-25%) - time in range:  - low (<70): 0% - normal (70-180): 17% - high (>180): 83%  CBG 25-75%: - am: 101-190, 235, 288 >> 109-187, 227, 255 >> 180-270 - 2h after b'fast: 98 >> 190, 210 >> 98-170 >> 220-340 - before lunch: 124, 266, 368 >> 422 >> 200 >> n/c >> 220-280 - 2h after lunch:200-230 >> 64-181, 207, 217 >> 240-280 - before dinner:  210, 269 >> 78 x1 >> 59-130 >> 180-260 - 2h after dinner:  270-289 >> 92, 198-282, 334 (sick) >> 220-340 - bedtime: n/c >> 263 >> n/c >> 56, 96-207, 258 >> see above - nighttime: 54, 58, 145 >> 270 >> n/c >> 200-310 Lowest sugar was 98 >> 78 >> 56 >> 73. Highest sugar was 422 >> 288 >> 334 >> 400s  -+ Mild CKD, last BUN/creatinine:  Lab Results  Component  Value Date   BUN 12 11/17/2017   CREATININE 0.99 11/17/2017  On enalapril. -+ HL; last set of lipids: Lab Results  Component Value Date   CHOL 218 (H) 11/17/2017   HDL 43.00 11/17/2017   LDLCALC 146 (H) 03/12/2015   LDLDIRECT 152.0 11/17/2017   TRIG 201.0 (H) 11/17/2017   CHOLHDL 5 11/17/2017  She is on Crestor and Zetia. She is on ASA 81. - last eye exam was in 2016: No DR.  + History of cataract surgery.  She did not schedule an appointment yet despite multiple promptings. - + Numbness and tingling in her feet.  Hypothyroidism: -Management hindered by medication noncompliance  Pt is on levothyroxine 125 mcg daily, taken: - in am - fasting - at least 30 min from b'fast - no Ca, Fe, MVI, PPIs - not on Biotin  Latest TSH was normal: Lab Results  Component Value Date   TSH 2.26 01/18/2018   TSH 10.50 (H) 11/17/2017   TSH 2.38 04/17/2017   TSH 0.07 (L) 09/18/2016   TSH 0.36 04/17/2016   TSH 0.24 (L) 02/06/2016   TSH 51.35 (H) 09/20/2015   TSH 0.52 06/18/2015   TSH 53.23 (H) 06/01/2015   TSH 40.79 (H) 03/12/2015   FREET4 0.95 01/18/2018   FREET4 0.78 04/17/2017  FREET4 0.97 09/18/2016   FREET4 1.21 04/17/2016   FREET4 2.16 (H) 02/06/2016   FREET4 0.27 (L) 09/20/2015   FREET4 0.57 (L) 03/12/2015   FREET4 0.21 (L) 12/21/2014   FREET4 0.30 (L) 08/16/2014   FREET4 1.03 04/26/2014   Pt denies: - feeling nodules in neck - hoarseness - dysphagia - choking - SOB with lying down  Her sister died, her nephew killed himself and she had an AMI in summer 2017.  ROS: Constitutional: no weight gain/no weight loss, no fatigue, no subjective hyperthermia, no subjective hypothermia Eyes: no blurry vision, no xerophthalmia ENT: no sore throat, + see HPI Cardiovascular: no CP/no SOB/no palpitations/no leg swelling Respiratory: no cough/no SOB/no wheezing Gastrointestinal: no N/no V/no D/no C/no acid reflux Musculoskeletal: no muscle aches/no joint aches Skin: no rashes,  no hair loss Neurological: no tremors/no numbness/no tingling/no dizziness  I reviewed pt's medications, allergies, PMH, social hx, family hx, and changes were documented in the history of present illness. Otherwise, unchanged from my initial visit note.  Past Medical History:  Diagnosis Date  . Cataract    BILATERAL REMOVED  . Chronic low back pain   . COPD (chronic obstructive pulmonary disease) ([place])   . Coronary artery disease    a. multiple PCIs to the mLCx. b. stent to dRCA 08/2011 in setting of NSTEMI. c. DES to prox-mid LCx for ISR 09/2011. d.  DESx2 to prox-mRCA 07/2012. e. Takotsubo event 12/2015 with patent stents.  . Depression   . Diabetes mellitus   . Diverticulosis   . Frequent PVCs    a. Noted in hospital 12/2015.  Marland Kitchen GERD (gastroesophageal reflux disease)   . Hyperlipidemia   . Hypertension   . Hypothyroidism   . Impingement syndrome of left shoulder   . Marijuana abuse   . Myocardial infarction ([place])    x 5  . Sleep apnea    mild-does not use cpap  . Takotsubo cardiomyopathy    a. 12/2015 - nephew committed suicide 1 week prior, sister died the morning of presentation - initially called a STEMI; cath with patent stents. LVEF 25-30%.  . Tendonitis of left rotator cuff   . Tobacco abuse   . Vascular dementia    Past Surgical History:  Procedure Laterality Date  . ABDOMINAL HYSTERECTOMY    . APPENDECTOMY    . BACK SURGERY    . CARDIAC CATHETERIZATION  2013  . CARDIAC CATHETERIZATION  2016  . CARDIAC CATHETERIZATION N/A 01/01/2016   Procedure: Left Heart Cath and Coronary Angiography;  Surgeon: Jettie Booze, MD;  Location: [facility];  Service: Cardiovascular;  Laterality: N/A;  . CATARACT EXTRACTION W/PHACO Left 01/30/2015   Procedure: CATARACT EXTRACTION PHACO AND INTRAOCULAR LENS PLACEMENT (IOC);  Surgeon: Birder Robson, MD;  Location: [facility];  Service: Ophthalmology;  Laterality: Left;  [place] 00:47   . CATARACT EXTRACTION W/PHACO Right 02/13/2015    Procedure: CATARACT EXTRACTION PHACO AND INTRAOCULAR LENS PLACEMENT (IOC);  Surgeon: Birder Robson, MD;  Location: [facility];  Service: Ophthalmology;  Laterality: Right;  cassette lot # [id number] H [place]  00:29.9 AP  20.7 CDE  6.20  . COLONOSCOPY N/A 11/02/2014   Procedure: COLONOSCOPY;  Surgeon: Inda Castle, MD;  Location: [facility];  Service: Endoscopy;  Laterality: N/A;  . CORONARY ANGIOPLASTY  2012   stent x 3   . CORONARY ANGIOPLASTY WITH STENT PLACEMENT  2013   Social History   Socioeconomic History  . Marital status: Married    Spouse name: Not  on file  . Number of children: Not on file  . Years of education: Not on file  . Highest education level: Not on file  Occupational History  . Not on file  Social Needs  . Financial resource strain: Not on file  . Food insecurity:    Worry: Not on file    Inability: Not on file  . Transportation needs:    Medical: Not on file    Non-medical: Not on file  Tobacco Use  . Smoking status: Current Every Day Smoker    Packs/day: 1.00    Years: 45.00    Pack years: 45.00    Types: Cigarettes  . Smokeless tobacco: Never Used  . Tobacco comment: Has cut back, trying to quit.   Substance and Sexual Activity  . Alcohol use: No    Alcohol/week: 0.0 standard drinks  . Drug use: Yes    Types: Marijuana    Comment: last night [id number]  . Sexual activity: Not on file  Lifestyle  . Physical activity:    Days per week: Not on file    Minutes per session: Not on file  . Stress: Not on file  Relationships  . Social connections:    Talks on phone: Not on file    Gets together: Not on file    Attends religious service: Not on file    Active member of club or organization: Not on file    Attends meetings of clubs or organizations: Not on file    Relationship status: Not on file  . Intimate partner violence:    Fear of current or ex partner: Not on file    Emotionally abused: Not on file    Physically abused: Not on file    Forced  sexual activity: Not on file  Other Topics Concern  . Not on file  Social History Narrative   Lives at home with her husband in [place].  Previously used marijuana - quit.      Regular exercise: no/ pain from a frozen rotator cuff   Caffeine use: coffee daily and pepsi      Does not have a living will.   Daughters and husband know her wishes- would desire CPR but not prolonged life support if futile   Current Outpatient Medications on File Prior to Visit  Medication Sig Dispense Refill  . amitriptyline (ELAVIL) 50 MG tablet Take 1 tablet (50 mg total) by mouth at bedtime. 30 tablet 2  . aspirin 81 MG tablet Take 81 mg by mouth daily.      . carvedilol (COREG) 3.125 MG tablet Take 2 tablets (6.25 mg total) by mouth 2 (two) times daily with a meal. 360 tablet 3  . clopidogrel (PLAVIX) 75 MG tablet Take 75 mg by mouth daily.    . enalapril (VASOTEC) 20 MG tablet Take 1 tablet by mouth once daily for blood pressure. 90 tablet 0  . ezetimibe (ZETIA) 10 MG tablet Take 1 tablet (10 mg total) by mouth daily. 90 tablet 3  . gabapentin (NEURONTIN) 100 MG capsule Take 1 capsule (100 mg total) by mouth every 8 (eight) hours. 90 capsule 0  . gabapentin (NEURONTIN) 300 MG capsule Take 1 capsule (300 mg total) by mouth 3 (three) times daily. 90 capsule 0  . glucose blood (FREESTYLE LITE) test strip USE 1 STRIP TO CHECK BLOOD SUGAR 5 TIMES DAILY FOR UNCONTROLLED DIABETES MELLITUS 400 each 5  . glucose monitoring kit (FREESTYLE) monitoring kit 1 each by Does not  apply route as needed for other. PHARMACY PLEASE SUBSTITUTE WHATEVER GLUCOMETER YOU CARRY 1 each 1  . insulin aspart (NOVOLOG FLEXPEN) 100 UNIT/ML FlexPen Inject 14-22 Units into the skin 3 (three) times daily with meals. Normally takes 12 units 15 pen 3  . Insulin Glargine (LANTUS SOLOSTAR) 100 UNIT/ML Solostar Pen Inject 40 Units into the skin at bedtime. 15 pen 3  . Insulin Pen Needle (INSUPEN PEN NEEDLES) 32G X 4 MM MISC Use to inject insulin  4 times daily. 250 each 1  . isosorbide mononitrate (IMDUR) 30 MG 24 hr tablet Take 1 tablet (30 mg total) by mouth daily. 90 tablet 3  . ketorolac (TORADOL) 10 MG tablet Take 1 tablet (10 mg total) by mouth every 8 (eight) hours. 15 tablet 0  . levothyroxine (SYNTHROID, LEVOTHROID) 125 MCG tablet Take 1 tablet (125 mcg total) by mouth daily. 90 tablet 3  . nystatin cream (MYCOSTATIN) Apply 1 application topically 2 (two) times daily. Apply a thin layer to clean and dry skin for up to 3 weeks. 30 g 0  . ondansetron (ZOFRAN-ODT) 8 MG disintegrating tablet Take 1 tablet (8 mg total) by mouth every 8 (eight) hours as needed for nausea. 20 tablet 0  . pantoprazole (PROTONIX) 40 MG tablet Take 1 tablet (40 mg total) by mouth daily. 90 tablet 3  . rosuvastatin (CRESTOR) 40 MG tablet Take 1 tablet (40 mg total) by mouth daily. 90 tablet 3  . sitaGLIPtin (JANUVIA) 100 MG tablet Take 1 tablet (100 mg total) by mouth daily. 90 tablet 3  . traZODone (DESYREL) 100 MG tablet Take 1 tablet (100 mg total) by mouth at bedtime. 90 tablet 1   No current facility-administered medications on file prior to visit.    Allergies  Allergen Reactions  . No Known Allergies    Family History  Problem Relation Age of Onset  . Heart attack Mother        First MI @ 20 - Died @ 40  . Heart disease Mother   . Heart disease Father        Died @ 20  . Throat cancer Brother   . Liver cancer Brother   . Colon cancer Sister     PE: BP (!) 160/100   Pulse (!) 102   Ht _0  (1.676 m) Comment: measured  Wt 145 lb (65.8 kg)   SpO2 98%   BMI 23.40 kg/m  Body mass index is 23.4 kg/m. Wt Readings from Last 3 Encounters:  05/04/18 145 lb (65.8 kg)  04/15/18 142 lb (64.4 kg)  02/10/18 148 lb 8 oz (67.4 kg)   Constitutional: Normal weight, in NAD Eyes: PERRLA, EOMI, no exophthalmos ENT: moist mucous membranes, no thyromegaly, no cervical lymphadenopathy Cardiovascular: tachycardia, RR, No MRG Respiratory: CTA  B Gastrointestinal: abdomen soft, NT, ND, BS+ Musculoskeletal: no deformities, strength intact in all 4 Skin: moist, warm, no rashes Neurological: no tremor with outstretched hands, DTR normal in all 4  ASSESSMENT: 1. DM2, insulin-dependent, uncontrolled, with complications - med noncompliance (dementia) - CAD - had 3 AMI in 2011 >> 7 stents placed; new AMI 02/07/16 after her sister's death - sCHF - PAD - CKD - PN  2. Hypothyroidism - Uncontrolled  3. HL  PLAN:  1. Patient with long-standing, uncontrolled, type 2 diabetes, insulin-dependent, with history of medication and visit noncompliance.  At each visit we discussed about the importance of taking all the NovoLog doses as prescribed, before each meal.  At last visit, sugars  were a little bit better especially in the morning but they were increasing during the day.  She was still missing many NovoLog doses especially if she was out and did not take her NovoLog pen with her.  We discussed about the importance of doing so.  Otherwise, we did not change her regimen at that time. -At this visit, she tells me that she again ran out of Lantus since last visit and was not able to refill it for a week.  She did not let us know about it.  I advised her to let us know if this happens in the future.  In an effort to keep her sugars down, she was injecting an extra dose of NovoLog, 18 units, at bedtime, but without success.  She was able to restart the Lantus approximately a week ago but her sugars are still very high in the 200s to 400s since then.  We will increase her Lantus dose and also her NovoLog with meals.  In the morning, per review of first CGM download, her sugars increase significantly after her morning coffee, for which she is not bolusing.  She does not eat breakfast.  I advised her to bolus 10 units and may need even 14 units before the coffee. - I suggested to:  Patient Instructions  Please increase: - Lantus 50 units at bedtime (if  not improving, increase to 55-60 units)  Please use: - Novolog 10-14 units before coffee  Increase: - NovoLog 14 units before lunch 18 units before dinner  If sugars before the meal are: 70 or lower, do not take insulin before that meal 71- 99, take no more than 12 units with that meal 100 or above, take the whole dose of insulin  Do not skip any insulin doses.   Please schedule a new eye exam.  Please return in 3 months with your sugar log.    - today, HbA1c is 10.2% (increased) - continue checking sugars at different times of the day - check 3x a day, rotating checks - advised to schedule a new yearly eye exams >> she is not UTD - + Flu shot today - Return to clinic in 3 mo with sugar log      2. Hypothyroidism - Patient with very uncontrolled hypothyroidism due to poor medication compliance. - however, latest thyroid labs reviewed with pt >> normal 01/2018 - she continues on LT4 125 mcg daily - pt feels good on this dose. - we discussed about taking the thyroid hormone every day, with water, >30 minutes before breakfast, separated by >4 hours from acid reflux medications, calcium, iron, multivitamins. Pt. is taking it correctly.  3. HL - Reviewed latest lipid panel from 10/2017: LDL was higher than before, triglycerides were also high, as was her total cholesterol.  She admitted for missing medication doses. Lab Results  Component Value Date   CHOL 218 (H) 11/17/2017   HDL 43.00 11/17/2017   LDLCALC 146 (H) 03/12/2015   LDLDIRECT 152.0 11/17/2017   TRIG 201.0 (H) 11/17/2017   CHOLHDL 5 11/17/2017  - Continues Crestor and Zetia without side effects.   Philemon Kingdom, MD PhD [facility]

## 2018-05-12 ENCOUNTER — Encounter: Payer: Self-pay | Admitting: Nurse Practitioner

## 2018-05-12 ENCOUNTER — Other Ambulatory Visit: Payer: Self-pay

## 2018-05-12 ENCOUNTER — Ambulatory Visit: Payer: Medicare Other | Attending: Nurse Practitioner | Admitting: Nurse Practitioner

## 2018-05-12 VITALS — BP 180/100 | HR 77 | Temp 97.8°F | Resp 16 | Ht 66.0 in | Wt 145.0 lb

## 2018-05-12 DIAGNOSIS — Z7982 Long term (current) use of aspirin: Secondary | ICD-10-CM | POA: Diagnosis not present

## 2018-05-12 DIAGNOSIS — M79601 Pain in right arm: Secondary | ICD-10-CM | POA: Diagnosis not present

## 2018-05-12 DIAGNOSIS — E039 Hypothyroidism, unspecified: Secondary | ICD-10-CM | POA: Diagnosis not present

## 2018-05-12 DIAGNOSIS — Z9842 Cataract extraction status, left eye: Secondary | ICD-10-CM | POA: Insufficient documentation

## 2018-05-12 DIAGNOSIS — M47812 Spondylosis without myelopathy or radiculopathy, cervical region: Secondary | ICD-10-CM | POA: Insufficient documentation

## 2018-05-12 DIAGNOSIS — Z90710 Acquired absence of both cervix and uterus: Secondary | ICD-10-CM | POA: Insufficient documentation

## 2018-05-12 DIAGNOSIS — I252 Old myocardial infarction: Secondary | ICD-10-CM | POA: Insufficient documentation

## 2018-05-12 DIAGNOSIS — G4733 Obstructive sleep apnea (adult) (pediatric): Secondary | ICD-10-CM | POA: Insufficient documentation

## 2018-05-12 DIAGNOSIS — K219 Gastro-esophageal reflux disease without esophagitis: Secondary | ICD-10-CM | POA: Insufficient documentation

## 2018-05-12 DIAGNOSIS — Z800 Family history of malignant neoplasm of digestive organs: Secondary | ICD-10-CM | POA: Diagnosis not present

## 2018-05-12 DIAGNOSIS — Z9841 Cataract extraction status, right eye: Secondary | ICD-10-CM | POA: Insufficient documentation

## 2018-05-12 DIAGNOSIS — Z7902 Long term (current) use of antithrombotics/antiplatelets: Secondary | ICD-10-CM | POA: Insufficient documentation

## 2018-05-12 DIAGNOSIS — F0150 Vascular dementia without behavioral disturbance: Secondary | ICD-10-CM | POA: Insufficient documentation

## 2018-05-12 DIAGNOSIS — I2510 Atherosclerotic heart disease of native coronary artery without angina pectoris: Secondary | ICD-10-CM | POA: Insufficient documentation

## 2018-05-12 DIAGNOSIS — M545 Low back pain: Secondary | ICD-10-CM | POA: Diagnosis present

## 2018-05-12 DIAGNOSIS — Z8249 Family history of ischemic heart disease and other diseases of the circulatory system: Secondary | ICD-10-CM | POA: Insufficient documentation

## 2018-05-12 DIAGNOSIS — Z955 Presence of coronary angioplasty implant and graft: Secondary | ICD-10-CM | POA: Insufficient documentation

## 2018-05-12 DIAGNOSIS — Z79890 Hormone replacement therapy (postmenopausal): Secondary | ICD-10-CM | POA: Insufficient documentation

## 2018-05-12 DIAGNOSIS — F1210 Cannabis abuse, uncomplicated: Secondary | ICD-10-CM | POA: Insufficient documentation

## 2018-05-12 DIAGNOSIS — M4716 Other spondylosis with myelopathy, lumbar region: Secondary | ICD-10-CM | POA: Diagnosis not present

## 2018-05-12 DIAGNOSIS — M7542 Impingement syndrome of left shoulder: Secondary | ICD-10-CM | POA: Diagnosis not present

## 2018-05-12 DIAGNOSIS — J449 Chronic obstructive pulmonary disease, unspecified: Secondary | ICD-10-CM | POA: Insufficient documentation

## 2018-05-12 DIAGNOSIS — F17210 Nicotine dependence, cigarettes, uncomplicated: Secondary | ICD-10-CM | POA: Diagnosis not present

## 2018-05-12 DIAGNOSIS — Z794 Long term (current) use of insulin: Secondary | ICD-10-CM | POA: Insufficient documentation

## 2018-05-12 DIAGNOSIS — I110 Hypertensive heart disease with heart failure: Secondary | ICD-10-CM | POA: Insufficient documentation

## 2018-05-12 DIAGNOSIS — E1142 Type 2 diabetes mellitus with diabetic polyneuropathy: Secondary | ICD-10-CM | POA: Insufficient documentation

## 2018-05-12 DIAGNOSIS — I5022 Chronic systolic (congestive) heart failure: Secondary | ICD-10-CM | POA: Insufficient documentation

## 2018-05-12 DIAGNOSIS — Z981 Arthrodesis status: Secondary | ICD-10-CM | POA: Insufficient documentation

## 2018-05-12 DIAGNOSIS — G8929 Other chronic pain: Secondary | ICD-10-CM

## 2018-05-12 DIAGNOSIS — G629 Polyneuropathy, unspecified: Secondary | ICD-10-CM

## 2018-05-12 DIAGNOSIS — G894 Chronic pain syndrome: Secondary | ICD-10-CM | POA: Insufficient documentation

## 2018-05-12 DIAGNOSIS — Z9049 Acquired absence of other specified parts of digestive tract: Secondary | ICD-10-CM | POA: Diagnosis not present

## 2018-05-12 DIAGNOSIS — Z79899 Other long term (current) drug therapy: Secondary | ICD-10-CM | POA: Diagnosis not present

## 2018-05-12 DIAGNOSIS — F4323 Adjustment disorder with mixed anxiety and depressed mood: Secondary | ICD-10-CM | POA: Insufficient documentation

## 2018-05-12 DIAGNOSIS — E782 Mixed hyperlipidemia: Secondary | ICD-10-CM | POA: Insufficient documentation

## 2018-05-12 MED ORDER — AMITRIPTYLINE HCL 50 MG PO TABS: 50.0000 mg | ORAL_TABLET | Freq: Every day | ORAL | 2 refills | Status: DC

## 2018-05-12 MED ORDER — GABAPENTIN 100 MG PO CAPS: 100.0000 mg | ORAL_CAPSULE | Freq: Three times a day (TID) | ORAL | 2 refills | Status: DC

## 2018-05-12 NOTE — Progress Notes (Signed)
Safety precautions to be maintained throughout the outpatient stay will include: orient to surroundings, keep bed in low position, maintain call bell within reach at all times, provide assistance with transfer out of bed and ambulation.  

## 2018-05-12 NOTE — Progress Notes (Addendum)
Patient's Name: Sarah Payne  MRN: [id number]  Referring Provider: [name], NP  DOB: August 02, 1945  PCP: [name], NP  DOS: 05/12/2018  Note by: Vevelyn Francois NP  Service setting: Ambulatory outpatient  Specialty: Interventional Pain Management  Location: [facility] (AMB) Pain Management Facility    Patient type: Established    Primary Reason(s) for Visit: Encounter for prescription drug management. (Level of risk: moderate)  CC: Shoulder Pain (bilateral) and Back Pain (upper,mid, and lower)  HPI  Sarah Payne is a 72 y.o. year old, female patient, who comes today for a medication management evaluation. She has CAD, multiple vessel; Tobacco abuse; Mixed hyperlipidemia; Hypothyroidism; Depression; History of lumbar fusion; Type II diabetes mellitus with neurological manifestations ([place]); Lichen sclerosus et atrophicus; Bronchitis, chronic obstructive ([place]); Chronic systolic congestive heart failure ([place]); Obstructive sleep apnea; Noncompliance with diabetes treatment; COPD, moderate ([place]); Diverticulosis of colon without hemorrhage; Esophageal reflux; Adjustment disorder with mixed anxiety and depressed mood; Benign neoplasm of colon; Internal hemorrhoids; HTN (hypertension); Acute systolic (congestive) heart failure ([place]); Takotsubo cardiomyopathy; Frequent PVCs; Insomnia; Chronic bilateral back pain; Lumbar degenerative disc disease; Lumbar spondylosis with myelopathy; Cannabis use disorder, mild, abuse; Cognitive changes; Peripheral polyneuropathy; Tobacco dependence; Neck pain; Right shoulder pain; Chronic pain syndrome; Chronic neck pain; Chronic pain of right upper extremity; and Cervical spondylosis on their problem list. Her primarily concern today is the Shoulder Pain (bilateral) and Back Pain (upper,mid, and lower)  Pain Assessment: Location: Mid, Lower, Upper Back Radiating: both shoulders Onset: More than a month ago Duration: Chronic pain Quality: Constant, Discomfort,  Sharp Severity: 5 /10 (subjective, self-reported pain score)  Note: Reported level is compatible with observation.                          Effect on ADL:   Timing: Constant Modifying factors: medications, heat, rest BP: (!) 180/100  HR: 77  Sarah Payne was last scheduled for an appointment on 04/15/2018 for medication management. During today's appointment we reviewed Sarah Payne chronic pain status, as well as her outpatient medication regimen. She admits that she is doing well with her current regimen. She admits that this helps her to function and that she is able to tolerate her pain.  She denies any side effects. She denies any concerns with her pain.  The patient  reports that she has current or past drug history. Drug: Marijuana. Her body mass index is 23.4 kg/m.  Further details on both, my assessment(s), as well as the proposed treatment plan, please see below.  Laboratory Chemistry  Inflammation Markers (CRP: Acute Phase) (ESR: Chronic Phase) Lab Results  Component Value Date   CRP 2.1 04/11/2013   ESRSEDRATE 58 (H) 04/11/2013                         Rheumatology Markers No results found for: RF, ANA, LABURIC, URICUR, LYMEIGGIGMAB, LYMEABIGMQN, HLAB27                      Renal Function Markers Lab Results  Component Value Date   BUN 12 11/17/2017   CREATININE 0.99 11/17/2017   BCR 19 08/11/2012   GFRAA >60 01/09/2016   GFRNONAA >60 01/09/2016                             Hepatic Function Markers Lab Results  Component Value Date  AST 14 11/17/2017   ALT 12 11/17/2017   ALBUMIN 3.9 11/17/2017   ALKPHOS 76 11/17/2017   HCVAB NEGATIVE 03/12/2015   AMYLASE 32 04/11/2016   LIPASE 55.0 04/11/2016                        Electrolytes Lab Results  Component Value Date   NA 141 11/17/2017   K 3.6 11/17/2017   CL 102 11/17/2017   CALCIUM 9.3 11/17/2017   MG 1.1 (L) 06/12/2014                        Neuropathy Markers Lab Results  Component Value Date    ATFTDDUK02 542 10/13/2011   HGBA1C 10.2 (A) 05/04/2018                        CNS Tests No results found for: COLORCSF, APPEARCSF, RBCCOUNTCSF, WBCCSF, POLYSCSF, LYMPHSCSF, EOSCSF, PROTEINCSF, GLUCCSF, JCVIRUS, CSFOLI, IGGCSF                      Bone Pathology Markers Lab Results  Component Value Date   VD25OH 41 02/25/2013                         Coagulation Parameters Lab Results  Component Value Date   INR 1.01 01/09/2016   LABPROT 13.5 01/09/2016   APTT 109 (H) 01/01/2016   PLT 289.0 06/02/2016                        Cardiovascular Markers Lab Results  Component Value Date   BNP 91 08/01/2014   CKTOTAL 272 (H) 08/01/2014   CKMB 4.5 (H) 08/01/2014   TROPONINI <0.03 01/09/2016   HGB 14.7 06/02/2016   HCT 43.7 06/02/2016                         CA Markers No results found for: CEA, CA125, LABCA2                      Note: Lab results reviewed.  Recent Diagnostic Imaging Results  DG Shoulder Right CLINICAL DATA:  Neck pain radiating into the right shoulder and right hand with numbness and tingling for the past week. No report of injury.  EXAM: RIGHT SHOULDER - 2+ VIEW  COMPARISON:  No recent studies in [facility]  FINDINGS: The bones are subjectively adequately mineralized. There is minimal spurring from the inferior lip of the glenoid. The joint spaces reasonably well-maintained. The [facility] joint exhibits mild narrowing without significant osteophyte formation. The subacromial subdeltoid space is normal.  IMPRESSION: Mild degenerative change of the glenohumeral and AC joints without significant joint space loss. No acute fracture or dislocation.  Electronically Signed   By: David  Martinique M.D.   On: 01/19/2018 13:18 DG Cervical Spine With Flex & Extend CLINICAL DATA:  Neck pain radiating into the right shoulder with right hand numbness and tingling for the past week. No known injury.  EXAM: CERVICAL SPINE COMPLETE WITH FLEXION AND EXTENSION  VIEWS  COMPARISON:  MRI of the cervical spine of October 15, 2006  FINDINGS: The cervical vertebral bodies are preserved in height. There is mild disc space narrowing at C4-5, C5-6, and C6-7. There are anterior endplate osteophytes at these levels. There is mild facet joint hypertrophy. The spinous processes are intact.  As the patient moves from the neutral to the flexed and to the extended positions. There is no significant instability of the vertebral bodies. The odontoid is intact. The prevertebral soft tissue spaces are normal.  IMPRESSION: Mild multilevel degenerative disc and facet joint change. No evidence of ligamentous instability. No acute bony abnormality.  Electronically Signed   By: David  Martinique M.D.   On: 01/19/2018 13:17  Complexity Note: Imaging results reviewed. Results shared with Sarah Payne, using Layman's terms.                         Meds   Current Outpatient Medications:  .  amitriptyline (ELAVIL) 50 MG tablet, Take 1 tablet (50 mg total) by mouth at bedtime., Disp: 30 tablet, Rfl: 2 .  aspirin 81 MG tablet, Take 81 mg by mouth daily.  , Disp: , Rfl:  .  carvedilol (COREG) 3.125 MG tablet, Take 2 tablets (6.25 mg total) by mouth 2 (two) times daily with a meal., Disp: 360 tablet, Rfl: 3 .  clopidogrel (PLAVIX) 75 MG tablet, Take 75 mg by mouth daily., Disp: , Rfl:  .  enalapril (VASOTEC) 20 MG tablet, Take 1 tablet by mouth once daily for blood pressure., Disp: 90 tablet, Rfl: 0 .  ezetimibe (ZETIA) 10 MG tablet, Take 1 tablet (10 mg total) by mouth daily., Disp: 90 tablet, Rfl: 3 .  gabapentin (NEURONTIN) 100 MG capsule, Take 1 capsule (100 mg total) by mouth every 8 (eight) hours., Disp: 90 capsule, Rfl: 2 .  glucose blood (FREESTYLE LITE) test strip, USE 1 STRIP TO CHECK BLOOD SUGAR 5 TIMES DAILY FOR UNCONTROLLED DIABETES MELLITUS, Disp: 400 each, Rfl: 5 .  glucose monitoring kit (FREESTYLE) monitoring kit, 1 each by Does not apply route as needed for other.  PHARMACY PLEASE SUBSTITUTE WHATEVER GLUCOMETER YOU CARRY, Disp: 1 each, Rfl: 1 .  insulin aspart (NOVOLOG FLEXPEN) 100 UNIT/ML FlexPen, Inject 14-22 Units into the skin 3 (three) times daily with meals. Normally takes 12 units, Disp: 15 pen, Rfl: 3 .  Insulin Glargine (LANTUS SOLOSTAR) 100 UNIT/ML Solostar Pen, Inject 50 Units into the skin at bedtime., Disp: 15 pen, Rfl: 3 .  Insulin Pen Needle (INSUPEN PEN NEEDLES) 32G X 4 MM MISC, Use to inject insulin 4 times daily., Disp: 250 each, Rfl: 1 .  isosorbide mononitrate (IMDUR) 30 MG 24 hr tablet, Take 1 tablet (30 mg total) by mouth daily., Disp: 90 tablet, Rfl: 3 .  levothyroxine (SYNTHROID, LEVOTHROID) 125 MCG tablet, Take 1 tablet (125 mcg total) by mouth daily., Disp: 90 tablet, Rfl: 3 .  nystatin cream (MYCOSTATIN), Apply 1 application topically 2 (two) times daily. Apply a thin layer to clean and dry skin for up to 3 weeks., Disp: 30 g, Rfl: 0 .  ondansetron (ZOFRAN-ODT) 8 MG disintegrating tablet, Take 1 tablet (8 mg total) by mouth every 8 (eight) hours as needed for nausea., Disp: 20 tablet, Rfl: 0 .  pantoprazole (PROTONIX) 40 MG tablet, Take 1 tablet (40 mg total) by mouth daily., Disp: 90 tablet, Rfl: 3 .  rosuvastatin (CRESTOR) 40 MG tablet, Take 1 tablet (40 mg total) by mouth daily., Disp: 90 tablet, Rfl: 3 .  traZODone (DESYREL) 100 MG tablet, Take 1 tablet (100 mg total) by mouth at bedtime., Disp: 90 tablet, Rfl: 1 .  gabapentin (NEURONTIN) 300 MG capsule, Take 1 capsule (300 mg total) by mouth 3 (three) times daily., Disp: 90 capsule, Rfl: 0 .  ketorolac (  TORADOL) 10 MG tablet, Take 1 tablet (10 mg total) by mouth every 8 (eight) hours. (Patient not taking: Reported on 05/12/2018), Disp: 15 tablet, Rfl: 0  ROS  Constitutional: Denies any fever or chills Gastrointestinal: No reported hemesis, hematochezia, vomiting, or acute GI distress Musculoskeletal: Denies any acute onset joint swelling, redness, loss of ROM, or  weakness Neurological: No reported episodes of acute onset apraxia, aphasia, dysarthria, agnosia, amnesia, paralysis, loss of coordination, or loss of consciousness  Allergies  Sarah Payne is allergic to no known allergies.  [place]  Drug: Sarah Payne  reports that she has current or past drug history. Drug: Marijuana. Alcohol:  reports that she does not drink alcohol. Tobacco:  reports that she has been smoking cigarettes. She has a 45.00 pack-year smoking history. She has never used smokeless tobacco. Medical:  has a past medical history of Cataract, Chronic low back pain, COPD (chronic obstructive pulmonary disease) ([place]), Coronary artery disease, Depression, Diabetes mellitus, Diverticulosis, Frequent PVCs, GERD (gastroesophageal reflux disease), Hyperlipidemia, Hypertension, Hypothyroidism, Impingement syndrome of left shoulder, Marijuana abuse, Myocardial infarction [facility]), Sleep apnea, Takotsubo cardiomyopathy, Tendonitis of left rotator cuff, Tobacco abuse, and Vascular dementia. Surgical: Sarah Payne  has a past surgical history that includes Back surgery; Appendectomy; Abdominal hysterectomy; Colonoscopy (N/A, 11/02/2014); Cataract extraction w/PHACO (Left, 01/30/2015); Cardiac catheterization (2013); Coronary angioplasty (2012); Coronary angioplasty with stent (2013); Cardiac catheterization (2016); Cataract extraction w/PHACO (Right, 02/13/2015); and Cardiac catheterization (N/A, 01/01/2016). Family: family history includes Colon cancer in her sister; Heart attack in her mother; Heart disease in her father and mother; Liver cancer in her brother; Throat cancer in her brother.  Constitutional Exam  General appearance: Well nourished, well developed, and well hydrated. In no apparent acute distress Vitals:   05/12/18 0954  BP: (!) 180/100  Pulse: 77  Resp: 16  Temp: 97.8 F (36.6 C)  TempSrc: Oral  SpO2: 98%  Weight: 145 lb (65.8 kg)  Height: 5' 6"  (1.676 m)   BMI Assessment: Estimated body  mass index is 23.4 kg/m as calculated from the following:   Height as of this encounter: 5' 6"  (1.676 m).   Weight as of this encounter: 145 lb (65.8 kg). Psych/Mental status: Alert, oriented x 3 (person, place, & time)       Eyes: PERLA Respiratory: No evidence of acute respiratory distress  Cervical Spine Area Exam  Skin & Axial Inspection: No masses, redness, edema, swelling, or associated skin lesions Alignment: Symmetrical Functional ROM: Adequate ROM      Stability: No instability detected Muscle Tone/Strength: Functionally intact. No obvious neuro-muscular anomalies detected. Sensory (Neurological): Unimpaired Palpation: No palpable anomalies              Upper Extremity (UE) Exam    Side: Right upper extremity  Side: Left upper extremity  Skin & Extremity Inspection: Skin color, temperature, and hair growth are WNL. No peripheral edema or cyanosis. No masses, redness, swelling, asymmetry, or associated skin lesions. No contractures.  Skin & Extremity Inspection: Skin color, temperature, and hair growth are WNL. No peripheral edema or cyanosis. No masses, redness, swelling, asymmetry, or associated skin lesions. No contractures.  Functional ROM: Unrestricted ROM          Functional ROM: Unrestricted ROM          Muscle Tone/Strength: Functionally intact. No obvious neuro-muscular anomalies detected.  Muscle Tone/Strength: Functionally intact. No obvious neuro-muscular anomalies detected.  Sensory (Neurological): Unimpaired          Sensory (Neurological): Unimpaired  Palpation: No palpable anomalies              Palpation: No palpable anomalies                   Thoracic Spine Area Exam  Skin & Axial Inspection: No masses, redness, or swelling Alignment: Symmetrical Functional ROM: Unrestricted ROM Stability: No instability detected Muscle Tone/Strength: Functionally intact. No obvious neuro-muscular anomalies detected. Sensory (Neurological): Unimpaired Muscle  strength & Tone: No palpable anomalies  Gait & Posture Assessment  Ambulation: Unassisted Gait: Relatively normal for age and body habitus Posture: WNL   Assessment  Primary Diagnosis & Pertinent Problem List: The primary encounter diagnosis was Cervical spondylosis. Diagnoses of Chronic pain of right upper extremity, Peripheral polyneuropathy, and Chronic pain syndrome were also pertinent to this visit.  Status Diagnosis  Controlled Controlled Controlled 1. Cervical spondylosis   2. Chronic pain of right upper extremity   3. Peripheral polyneuropathy   4. Chronic pain syndrome     Problems updated and reviewed during this visit: No problems updated. Plan of Care  Pharmacotherapy (Medications Ordered): Meds ordered this encounter  Medications  . gabapentin (NEURONTIN) 100 MG capsule    Sig: Take 1 capsule (100 mg total) by mouth every 8 (eight) hours.    Dispense:  90 capsule    Refill:  2    Do not place this medication, or any other prescription from our practice, on "Automatic Refill". Patient may have prescription filled one day early if pharmacy is closed on scheduled refill date.    Order Specific Question:   Supervising Provider    Answer:   Milinda Pointer [phone]  . amitriptyline (ELAVIL) 50 MG tablet    Sig: Take 1 tablet (50 mg total) by mouth at bedtime.    Dispense:  30 tablet    Refill:  2    Order Specific Question:   Supervising Provider    AnswerMilinda Pointer [phone]   New Prescriptions   No medications on file   Medications administered today: Sarah Payne had no medications administered during this visit. Lab-work, procedure(s), and/or referral(s): No orders of the defined types were placed in this encounter.  Imaging and/or referral(s): None  Interventional therapies: Planned, scheduled, and/or pending:   Not at this time.   Provider-requested follow-up: Return in about 3 months (around 08/12/2018) for MedMgmt.  Future  Appointments  Date Time Provider [place]  05/17/2018  9:40 AM Minna Merritts, MD [facility]  08/10/2018  9:45 AM Vevelyn Francois, NP [facility] None  08/25/2018  9:00 AM Philemon Kingdom, MD [facility] None  11/24/2018 10:00 AM Eustace Pen, LPN [facility]  11/24/4330  2:20 PM [name], NP [facility]   Primary Care Physician: [name], NP Location: [facility]patient [facility] Facility Note by: Vevelyn Francois NP Date: 05/12/2018; Time: 11:17 AM  Pain Score Disclaimer: We use the NRS-11 scale. This is a self-reported, subjective measurement of pain severity with only modest accuracy. It is used primarily to identify changes within a particular patient. It must be understood that outpatient pain scales are significantly less accurate that those used for research, where they can be applied under ideal controlled circumstances with minimal exposure to variables. In reality, the score is likely to be a combination of pain intensity and pain affect, where pain affect describes the degree of emotional arousal or changes in action readiness caused by the sensory experience of pain. Factors such  as social and work situation, setting, emotional state, anxiety levels, expectation, and prior pain experience may influence pain perception and show large inter-individual differences that may also be affected by time variables.  Patient instructions provided during this appointment: Patient Instructions    Elavil 50 mg and gabapentin 100 mg escribed to your pharmacy

## 2018-05-12 NOTE — Patient Instructions (Addendum)
   Elavil 50 mg and gabapentin 100 mg escribed to your pharmacy

## 2018-05-16 NOTE — Progress Notes (Signed)
Cardiology Office Note  Date:  05/17/2018   ID:  Sarah Payne, Sarah Payne 12/28/1945, MRN [id number]  PCP:  [name], NP   Chief Complaint  Patient presents with  . OTHER    6 month f/u c/o elevated BP, COPD, sob and coughing. Meds reviewed verbally with pt.    HPI:  72 year old woman with a long history of  smoking,  coronary artery disease,  prior stent x3 placed to the mid left circumflex with moderate to severe LAD disease ,  stent placed to her distal RCA in February 2013,  stent to the mid left circumflex in March 2013 ,  poorly controlled diabetes with hemoglobin A1c of 10,  depression,  hypertension,  cardiac catheterization 08/19/2012 showing moderate mid LAD disease at the takeoff of the diagonal vessel, severe ostial to mid RCA disease, ejection fraction greater than 55%. She had FFR pressure wire showing severe disease of the RCA with drug-eluting stent x2 placed. C. difficile late 2015 after antibiotics Previous history of running out of her medications cardiac catheterization performed showing patent stents 12/2015 Postop procedure complication of groin hematoma She presents today for follow-up of her coronary artery disease,   Husband a major stressor, He is not motivated, prior stroke, sits around and sleeps all day, up half the night Does not work, does not help except the house or do groceries or any of the housework She reports he is giving away their money to the kids He has drained their 401K and savings Kids also taking advantage of him taking the money also she helps take care of the kids/grandkids for free  Reports that she is out of some of her medications including the Crestor No chest pain, continues to smoke No exercise plan Continues to work on her diabetes with primary care Previous hemoglobin A1c in the 10 range  EKG personally reviewed by myself on todays visit normal sinus rhythm with rate 64 bpm no significant ST or T-wave  changes  Other past medical history reviewed At baseline with Chronic severe back pain, Seen by neurosurgery.  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 Diagnosed with unstable angina. Had cardiac catheterization showing severe stable coronary disease, Echocardiogram with normal ejection fraction Total cholesterol 292  Since her discharge, she denies any further chest pain. She reports that she is taking her medications. Recently been to diabetes instruction classes, scheduled to start insulin. She has been compliant with her statin per the patient She has developed a cough since her discharge from the hospital. She does not want antibiotics given prior history of C. Difficile at the end of 2015  Details of the echocardiogram 08/01/2014; ejection fraction 55-60%, normal right ventricular systolic pressure, normal right heart systolic function Catheterization details, unchanged from prior catheterization. Lab report has not been signed off in the computer. Catheterization by Dr. Ellyn Hack. Medical management was recommended  Echocardiogram February 07, 2011 shows normal LV systolic function, diastolic dysfunction, moderate inferior wall hypokinesis, normal right ventricular  systolic pressures.    PMH:   has a past medical history of Cataract, Chronic low back pain, COPD (chronic obstructive pulmonary disease) ([place]), Coronary  artery disease, Depression, Diabetes mellitus, Diverticulosis, Frequent PVCs, GERD (gastroesophageal reflux disease), Hyperlipidemia, Hypertension, Hypothyroidism, Impingement syndrome of left shoulder, Marijuana abuse, Myocardial infarction [facility]), Sleep apnea, Takotsubo cardiomyopathy, Tendonitis of left rotator cuff, Tobacco abuse, and Vascular dementia.  PSH:    Past Surgical History:  Procedure Laterality Date  . ABDOMINAL HYSTERECTOMY    . APPENDECTOMY    . BACK SURGERY    . CARDIAC CATHETERIZATION  2013  . CARDIAC CATHETERIZATION  2016  . CARDIAC CATHETERIZATION N/A 01/01/2016   Procedure: Left Heart Cath and Coronary Angiography;  Surgeon: Jettie Booze, MD;  Location: [facility];  Service: Cardiovascular;  Laterality: N/A;  . CATARACT EXTRACTION W/PHACO Left 01/30/2015   Procedure: CATARACT EXTRACTION PHACO AND INTRAOCULAR LENS PLACEMENT (IOC);  Surgeon: Birder Robson, MD;  Location: [facility];  Service: Ophthalmology;  Laterality: Left;  [place] 00:47   . CATARACT EXTRACTION W/PHACO Right 02/13/2015   Procedure: CATARACT EXTRACTION PHACO AND INTRAOCULAR LENS PLACEMENT (IOC);  Surgeon: Birder Robson, MD;  Location: [facility];  Service: Ophthalmology;  Laterality: Right;  cassette lot # [id number] H [place]  00:29.9 AP  20.7 CDE  6.20  . COLONOSCOPY N/A 11/02/2014   Procedure: COLONOSCOPY;  Surgeon: Inda Castle, MD;  Location: [place];  Service: Endoscopy;  Laterality: N/A;  . CORONARY ANGIOPLASTY  2012   stent x 3   . CORONARY ANGIOPLASTY WITH STENT PLACEMENT  2013    Current Outpatient Medications  Medication Sig Dispense Refill  . amitriptyline (ELAVIL) 50 MG tablet Take 1 tablet (50 mg total) by mouth at bedtime. 30 tablet 2  . aspirin 81 MG tablet Take 81 mg by mouth daily.      . clopidogrel (PLAVIX) 75 MG tablet Take 1 tablet (75 mg total) by mouth daily. 90 tablet 3  . enalapril (VASOTEC) 20 MG tablet Take 1 tablet by mouth once daily for blood pressure. 90 tablet  3  . ezetimibe (ZETIA) 10 MG tablet Take 1 tablet (10 mg total) by mouth daily. 90 tablet 3  . gabapentin (NEURONTIN) 300 MG capsule Take 1 capsule (300 mg total) by mouth 3 (three) times daily. 90 capsule 0  . glucose blood (FREESTYLE LITE) test strip USE 1 STRIP TO CHECK BLOOD SUGAR 5 TIMES DAILY FOR UNCONTROLLED DIABETES MELLITUS 400 each 5  . glucose monitoring kit (FREESTYLE) monitoring kit 1 each by Does not apply route as needed for other. PHARMACY PLEASE SUBSTITUTE WHATEVER GLUCOMETER YOU CARRY 1 each 1  . insulin aspart (NOVOLOG FLEXPEN) 100 UNIT/ML FlexPen Inject 14-22 Units into the skin 3 (three) times daily with meals. Normally takes 12 units 15 pen 3  . Insulin Glargine (LANTUS SOLOSTAR) 100 UNIT/ML Solostar Pen Inject 50 Units into the skin at bedtime. 15 pen 3  . Insulin Pen Needle (INSUPEN PEN NEEDLES) 32G X 4 MM MISC Use to inject insulin 4 times daily. 250 each 1  . isosorbide mononitrate (IMDUR) 30 MG 24 hr tablet Take 1 tablet (30 mg total) by mouth daily. 90 tablet 3  . ketorolac (TORADOL) 10 MG tablet Take 1 tablet (10 mg total) by mouth every 8 (eight) hours. 15 tablet 0  . levothyroxine (SYNTHROID, LEVOTHROID) 125 MCG tablet Take 1 tablet (125 mcg total) by mouth daily. 90 tablet 3  . nystatin cream (MYCOSTATIN) Apply 1 application topically 2 (two) times  daily. Apply a thin layer to clean and dry skin for up to 3 weeks. 30 g 0  . ondansetron (ZOFRAN-ODT) 8 MG disintegrating tablet Take 1 tablet (8 mg total) by mouth every 8 (eight) hours as needed for nausea. 20 tablet 0  . pantoprazole (PROTONIX) 40 MG tablet Take 1 tablet (40 mg total) by mouth daily. 90 tablet 3  . rosuvastatin (CRESTOR) 40 MG tablet Take 1 tablet (40 mg total) by mouth daily. 90 tablet 3  . traZODone (DESYREL) 100 MG tablet Take 1 tablet (100 mg total) by mouth at bedtime. 90 tablet 1  . carvedilol (COREG) 6.25 MG tablet Take 1 tablet (6.25 mg total) by mouth 2 (two) times daily. 180 tablet 3   No  current facility-administered medications for this visit.      Allergies:   No known allergies   Social History:  The patient  reports that she has been smoking cigarettes. She has a 45.00 pack-year smoking history. She has never used smokeless tobacco. She reports that she has current or past drug history. Drug: Marijuana. She reports that she does not drink alcohol.   Family History:   family history includes Colon cancer in her sister; Heart attack in her mother; Heart disease in her father and mother; Liver cancer in her brother; Throat cancer in her brother.    Review of Systems: Review of Systems  Constitutional: Negative.   Respiratory: Negative.   Cardiovascular: Negative.   Gastrointestinal: Negative.   Musculoskeletal: Negative.   Neurological: Negative.   Psychiatric/Behavioral: The patient is nervous/anxious.   All other systems reviewed and are negative.    PHYSICAL EXAM: VS:  BP 128/70 (BP Location: Left Arm, Patient Position: Sitting, Cuff Size: Normal)   Pulse 64   Ht 5' 6"  (1.676 m)   Wt 147 lb 12 oz (67 kg)   BMI 23.85 kg/m  , BMI Body mass index is 23.85 kg/m. Constitutional:  oriented to person, place, and time. No distress.  HENT:  Head: Normocephalic and atraumatic.  Eyes:  no discharge. No scleral icterus.  Neck: Normal range of motion. Neck supple. No JVD present.  Cardiovascular: Normal rate, regular rhythm, normal heart sounds and intact distal pulses. Exam reveals no gallop and no friction rub. No edema No murmur heard. Pulmonary/Chest: Effort normal and breath sounds normal. No stridor. No respiratory distress.  no wheezes.  no rales.  no tenderness.  Abdominal: Soft.  no distension.  no tenderness.  Musculoskeletal: Normal range of motion.  no  tenderness or deformity.  Neurological:  normal muscle tone. Coordination normal. No atrophy Skin: Skin is warm and dry. No rash noted. not diaphoretic.  Psychiatric:  normal mood and affect. behavior is  normal. Thought content normal.    Recent Labs: 11/17/2017: ALT 12; BUN 12; Creatinine, Ser 0.99; Potassium 3.6; Sodium 141 01/18/2018: TSH 2.26    Lipid Panel Lab Results  Component Value Date   CHOL 218 (H) 11/17/2017   HDL 43.00 11/17/2017   LDLCALC 146 (H) 03/12/2015   TRIG 201.0 (H) 11/17/2017     Wt Readings from Last 3 Encounters:  05/17/18 147 lb 12 oz (67 kg)  05/12/18 145 lb (65.8 kg)  05/04/18 145 lb (65.8 kg)     ASSESSMENT AND PLAN:  CAD, multiple vessel -  Denies any symptoms concerning for angina Stressed the importance of better diabetes control, also better control of her cholesterol Suspect medication noncompliance an issue Lots of stress at home  Mixed hyperlipidemia -  Recommended that she stay on Crestor and Zetia New prescription sent in  Diabetes mellitus type 2 with peripheral artery disease ([place]) -  Working with endocrinology Numbers continue to be poorly controlled Prior history of medication compliance Stressed the importance of taking her insulin  Chronic systolic congestive heart failure (HCC) -  on ACE inhibitor, Imdur, carvedilol  New prescription sent in  Essential hypertension - Plan: EKG 12-Lead, VAS US CAROTID Blood pressure better,  Tobacco abuse - Plan: EKG 12-Lead, VAS US CAROTID Still smoking .  Discussed with her again today She does not want Chantix  COPD, moderate ([place]) - Plan: EKG 12-Lead, VAS US CAROTID Smoking cessation recommended Breathing stable, chronic cough  Anxiety/stress Husband causing lots of stressors as are the children   Total encounter time more than 45 minutes  Greater than 50% was spent in counseling and coordination of care with the patient   Disposition:   F/U  6 months   Orders Placed This Encounter  Procedures  . EKG 12-Lead     Signed, Esmond Plants, M.D., Ph.D. 05/17/2018  [place], [place]

## 2018-05-17 ENCOUNTER — Encounter: Payer: Self-pay | Admitting: Cardiovascular Disease

## 2018-05-17 ENCOUNTER — Ambulatory Visit (INDEPENDENT_AMBULATORY_CARE_PROVIDER_SITE_OTHER): Payer: Medicare Other | Admitting: Cardiovascular Disease

## 2018-05-17 VITALS — BP 128/70 | HR 64 | Ht 66.0 in | Wt 147.8 lb

## 2018-05-17 DIAGNOSIS — E1151 Type 2 diabetes mellitus with diabetic peripheral angiopathy without gangrene: Secondary | ICD-10-CM | POA: Diagnosis not present

## 2018-05-17 DIAGNOSIS — Z720 Tobacco use: Secondary | ICD-10-CM | POA: Diagnosis not present

## 2018-05-17 DIAGNOSIS — I2510 Atherosclerotic heart disease of native coronary artery without angina pectoris: Secondary | ICD-10-CM

## 2018-05-17 DIAGNOSIS — I10 Essential (primary) hypertension: Secondary | ICD-10-CM | POA: Diagnosis not present

## 2018-05-17 DIAGNOSIS — I5022 Chronic systolic (congestive) heart failure: Secondary | ICD-10-CM | POA: Diagnosis not present

## 2018-05-17 DIAGNOSIS — J449 Chronic obstructive pulmonary disease, unspecified: Secondary | ICD-10-CM

## 2018-05-17 DIAGNOSIS — I25118 Atherosclerotic heart disease of native coronary artery with other forms of angina pectoris: Secondary | ICD-10-CM | POA: Diagnosis not present

## 2018-05-17 DIAGNOSIS — E782 Mixed hyperlipidemia: Secondary | ICD-10-CM | POA: Diagnosis not present

## 2018-05-17 MED ORDER — ISOSORBIDE MONONITRATE ER 30 MG PO TB24: 30.0000 mg | ORAL_TABLET | Freq: Every day | ORAL | 3 refills | Status: DC

## 2018-05-17 MED ORDER — PANTOPRAZOLE SODIUM 40 MG PO TBEC: 40.0000 mg | DELAYED_RELEASE_TABLET | Freq: Every day | ORAL | 3 refills | Status: DC

## 2018-05-17 MED ORDER — ROSUVASTATIN CALCIUM 40 MG PO TABS: 40.0000 mg | ORAL_TABLET | Freq: Every day | ORAL | 3 refills | Status: DC

## 2018-05-17 MED ORDER — EZETIMIBE 10 MG PO TABS: 10.0000 mg | ORAL_TABLET | Freq: Every day | ORAL | 3 refills | Status: DC

## 2018-05-17 MED ORDER — CARVEDILOL 6.25 MG PO TABS: 6.2500 mg | ORAL_TABLET | Freq: Two times a day (BID) | ORAL | 3 refills | Status: DC

## 2018-05-17 MED ORDER — CLOPIDOGREL BISULFATE 75 MG PO TABS: 75.0000 mg | ORAL_TABLET | Freq: Every day | ORAL | 3 refills | Status: DC

## 2018-05-17 MED ORDER — ENALAPRIL MALEATE 20 MG PO TABS: ORAL_TABLET | ORAL | 3 refills | Status: DC

## 2018-05-17 NOTE — Patient Instructions (Addendum)

## 2018-06-08 ENCOUNTER — Other Ambulatory Visit: Payer: Self-pay | Admitting: Internal Medicine

## 2018-07-29 ENCOUNTER — Other Ambulatory Visit: Payer: Self-pay | Admitting: Nurse Practitioner

## 2018-08-10 ENCOUNTER — Encounter: Payer: Medicare Other | Admitting: Nurse Practitioner

## 2018-08-23 ENCOUNTER — Encounter: Payer: Self-pay | Admitting: Nurse Practitioner

## 2018-08-23 ENCOUNTER — Ambulatory Visit: Payer: Medicare HMO | Attending: Nurse Practitioner | Admitting: Nurse Practitioner

## 2018-08-23 ENCOUNTER — Other Ambulatory Visit: Payer: Self-pay

## 2018-08-23 VITALS — BP 142/86 | HR 69 | Temp 97.9°F | Ht 66.0 in | Wt 145.0 lb

## 2018-08-23 DIAGNOSIS — M79601 Pain in right arm: Secondary | ICD-10-CM | POA: Diagnosis not present

## 2018-08-23 DIAGNOSIS — M5136 Other intervertebral disc degeneration, lumbar region: Secondary | ICD-10-CM | POA: Diagnosis not present

## 2018-08-23 DIAGNOSIS — M51369 Other intervertebral disc degeneration, lumbar region without mention of lumbar back pain or lower extremity pain: Secondary | ICD-10-CM

## 2018-08-23 DIAGNOSIS — G629 Polyneuropathy, unspecified: Secondary | ICD-10-CM

## 2018-08-23 DIAGNOSIS — G8929 Other chronic pain: Secondary | ICD-10-CM | POA: Diagnosis not present

## 2018-08-23 DIAGNOSIS — M47812 Spondylosis without myelopathy or radiculopathy, cervical region: Secondary | ICD-10-CM | POA: Diagnosis not present

## 2018-08-23 MED ORDER — GABAPENTIN 100 MG PO CAPS: 100.0000 mg | ORAL_CAPSULE | Freq: Three times a day (TID) | ORAL | 2 refills | Status: DC

## 2018-08-23 MED ORDER — AMITRIPTYLINE HCL 50 MG PO TABS: 50.0000 mg | ORAL_TABLET | Freq: Every day | ORAL | 2 refills | Status: DC
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
## 2018-08-24 ENCOUNTER — Encounter: Payer: Self-pay | Admitting: Emergency Medicine

## 2018-08-24 ENCOUNTER — Emergency Department: Payer: Medicare HMO

## 2018-08-24 ENCOUNTER — Other Ambulatory Visit: Payer: Self-pay

## 2018-08-24 ENCOUNTER — Emergency Department
Admission: EM | Admit: 2018-08-24 | Discharge: 2018-08-24 | Disposition: A | Discharge status: Home / Self Care | Payer: Medicare HMO | Attending: Emergency Medicine | Admitting: Emergency Medicine

## 2018-08-24 DIAGNOSIS — F329 Major depressive disorder, single episode, unspecified: Secondary | ICD-10-CM | POA: Insufficient documentation

## 2018-08-24 DIAGNOSIS — Z7902 Long term (current) use of antithrombotics/antiplatelets: Secondary | ICD-10-CM | POA: Insufficient documentation

## 2018-08-24 DIAGNOSIS — I2510 Atherosclerotic heart disease of native coronary artery without angina pectoris: Secondary | ICD-10-CM | POA: Insufficient documentation

## 2018-08-24 DIAGNOSIS — F1210 Cannabis abuse, uncomplicated: Secondary | ICD-10-CM | POA: Diagnosis not present

## 2018-08-24 DIAGNOSIS — F17210 Nicotine dependence, cigarettes, uncomplicated: Secondary | ICD-10-CM | POA: Insufficient documentation

## 2018-08-24 DIAGNOSIS — J449 Chronic obstructive pulmonary disease, unspecified: Secondary | ICD-10-CM | POA: Diagnosis not present

## 2018-08-24 DIAGNOSIS — I252 Old myocardial infarction: Secondary | ICD-10-CM | POA: Diagnosis not present

## 2018-08-24 DIAGNOSIS — I110 Hypertensive heart disease with heart failure: Secondary | ICD-10-CM | POA: Insufficient documentation

## 2018-08-24 DIAGNOSIS — Z7982 Long term (current) use of aspirin: Secondary | ICD-10-CM | POA: Diagnosis not present

## 2018-08-24 DIAGNOSIS — R1310 Dysphagia, unspecified: Secondary | ICD-10-CM

## 2018-08-24 DIAGNOSIS — Z955 Presence of coronary angioplasty implant and graft: Secondary | ICD-10-CM | POA: Insufficient documentation

## 2018-08-24 DIAGNOSIS — Z794 Long term (current) use of insulin: Secondary | ICD-10-CM | POA: Insufficient documentation

## 2018-08-24 DIAGNOSIS — J181 Lobar pneumonia, unspecified organism: Secondary | ICD-10-CM | POA: Diagnosis not present

## 2018-08-24 DIAGNOSIS — K209 Esophagitis, unspecified without bleeding: Secondary | ICD-10-CM

## 2018-08-24 DIAGNOSIS — I5020 Unspecified systolic (congestive) heart failure: Secondary | ICD-10-CM | POA: Insufficient documentation

## 2018-08-24 DIAGNOSIS — E119 Type 2 diabetes mellitus without complications: Secondary | ICD-10-CM | POA: Insufficient documentation

## 2018-08-24 DIAGNOSIS — E039 Hypothyroidism, unspecified: Secondary | ICD-10-CM | POA: Insufficient documentation

## 2018-08-24 DIAGNOSIS — Z79899 Other long term (current) drug therapy: Secondary | ICD-10-CM | POA: Diagnosis not present

## 2018-08-24 DIAGNOSIS — R079 Chest pain, unspecified: Secondary | ICD-10-CM | POA: Diagnosis not present

## 2018-08-24 DIAGNOSIS — F4323 Adjustment disorder with mixed anxiety and depressed mood: Secondary | ICD-10-CM | POA: Diagnosis not present

## 2018-08-24 DIAGNOSIS — J90 Pleural effusion, not elsewhere classified: Secondary | ICD-10-CM | POA: Diagnosis not present

## 2018-08-24 LAB — BASIC METABOLIC PANEL
Anion gap: 8 (ref 5–15)
BUN: 17 mg/dL (ref 8–23)
CO2: 28 mmol/L (ref 22–32)
Calcium: 9.2 mg/dL (ref 8.9–10.3)
Chloride: 99 mmol/L (ref 98–111)
Creatinine, Ser: 1.18 mg/dL — ABNORMAL HIGH (ref 0.44–1.00)
GFR calc Af Amer: 54 mL/min — ABNORMAL LOW (ref >60)
GFR calc non Af Amer: 47 mL/min — ABNORMAL LOW (ref >60)
Glucose, Bld: 342 mg/dL — ABNORMAL HIGH (ref 70–99)
Potassium: 3.8 mmol/L (ref 3.5–5.1)
Sodium: 135 mmol/L (ref 135–145)

## 2018-08-24 LAB — CBC
HCT: 43 % (ref 36.0–46.0)
Hemoglobin: 14.3 g/dL (ref 12.0–15.0)
MCH: 30.7 pg (ref 26.0–34.0)
MCHC: 33.3 g/dL (ref 30.0–36.0)
MCV: 92.3 fL (ref 80.0–100.0)
Platelets: 274 10*3/uL (ref 150–400)
RBC: 4.66 MIL/uL (ref 3.87–5.11)
RDW: 12.8 % (ref 11.5–15.5)
WBC: 10.9 10*3/uL — ABNORMAL HIGH (ref 4.0–10.5)
nRBC: 0 % (ref 0.0–0.2)

## 2018-08-24 LAB — GLUCOSE, CAPILLARY: Glucose-Capillary: 366 mg/dL — ABNORMAL HIGH (ref 70–99)

## 2018-08-24 LAB — TROPONIN I: Troponin I: <0.03 ng/mL (ref <0.03)

## 2018-08-24 MED ORDER — FAMOTIDINE 40 MG PO TABS: 40.0000 mg | ORAL_TABLET | Freq: Every evening | ORAL | 1 refills | Status: DC

## 2018-08-24 MED ORDER — SUCRALFATE 1 G PO TABS: 1.0000 g | ORAL_TABLET | Freq: Four times a day (QID) | ORAL | 0 refills | Status: DC

## 2018-08-24 MED ORDER — ALUM & MAG HYDROXIDE-SIMETH 200-200-20 MG/5ML PO SUSP
30.0000 mL | Freq: Once | ORAL | Status: AC
  Administered 2018-08-24: 30 mL via ORAL
  Filled 2018-08-24: qty 30

## 2018-08-24 MED ORDER — SODIUM CHLORIDE 0.9 % IV BOLUS
1000.0000 mL | Freq: Once | INTRAVENOUS | Status: AC
  Administered 2018-08-24: 1000 mL via INTRAVENOUS

## 2018-08-24 MED ORDER — LIDOCAINE VISCOUS HCL 2 % MT SOLN
15.0000 mL | Freq: Once | OROMUCOSAL | Status: AC
  Administered 2018-08-24: 15 mL via ORAL
  Filled 2018-08-24: qty 15

## 2018-08-24 NOTE — ED Notes (Signed)
Sent rainbow to lab. Gave urine cup with bag to pt. Waiting on urine sample.

## 2018-08-24 NOTE — ED Provider Notes (Signed)
[facility] Emergency Department Provider Note  ____________________________________________   I have reviewed the triage vital signs and the nursing notes.   HISTORY  Chief Complaint Difficulty swallowing  History limited by: Not Limited   HPI Sarah Payne is a 73 y.o. female who presents to the emergency department today because of concerns for difficulty with swallowing.  She states that this is now happened a couple of times over the past 2 weeks.  She will go to swallow something and feel like it gets stuck in her esophagus.  When this happened she might have some difficulty with breathing.  She is then able to get the food or liquid down.  She states that she has never had this happen to her before.  She does have siblings that have required esophageal dilations in the past. This does not remind her of when she has had heart issues in the past.   Per medical record review patient has a history of CAD.   Past Medical History:  Diagnosis Date  . Cataract    BILATERAL REMOVED  . Chronic low back pain   . COPD (chronic obstructive pulmonary disease) ([place])   . Coronary artery disease    a. multiple PCIs to the mLCx. b. stent to dRCA 08/2011 in setting of NSTEMI. c. DES to prox-mid LCx for ISR 09/2011. d.  DESx2 to prox-mRCA 07/2012. e. Takotsubo event 12/2015 with patent stents.  . Depression   . Diabetes mellitus   . Diverticulosis   . Frequent PVCs    a. Noted in hospital 12/2015.  Marland Kitchen GERD (gastroesophageal reflux disease)   . Hyperlipidemia   . Hypertension   . Hypothyroidism   . Impingement syndrome of left shoulder   . Marijuana abuse   . Myocardial infarction ([place])    x 5  . Sleep apnea    mild-does not use cpap  . Takotsubo cardiomyopathy    a. 12/2015 - nephew committed suicide 1 week prior, sister died the morning of presentation - initially called a STEMI; cath with patent stents. LVEF 25-30%.  . Tendonitis of left rotator cuff   . Tobacco  abuse   . Vascular dementia     Patient Active Problem List   Diagnosis Date Noted  . Atherosclerosis of native coronary artery of native heart with stable angina pectoris ([place]) 05/17/2018  . Chronic neck pain 02/10/2018  . Chronic pain of right upper extremity 02/10/2018  . Cervical spondylosis 02/10/2018  . Neck pain 01/19/2018  . Right shoulder pain 01/19/2018  . Chronic pain syndrome 01/19/2018  . Lumbar degenerative disc disease 09/29/2017  . Lumbar spondylosis with myelopathy 09/29/2017  . Chronic bilateral back pain 05/19/2017  . Insomnia 01/12/2017  . Cannabis use disorder, mild, abuse 09/03/2016  . Cognitive changes 09/03/2016  . Takotsubo cardiomyopathy 01/02/2016  . Frequent PVCs 01/02/2016  . Acute systolic (congestive) heart failure ([place]) 01/01/2016  . HTN (hypertension) 09/20/2015  . Benign neoplasm of colon 11/02/2014  . Internal hemorrhoids 11/02/2014  . Adjustment disorder with mixed anxiety and depressed mood 08/16/2014  . Esophageal reflux 05/23/2014  . Diverticulosis of colon without hemorrhage 05/17/2014  . Peripheral polyneuropathy 05/05/2014  . Tobacco dependence 05/05/2014  . COPD, moderate ([place]) 03/29/2013  . Noncompliance with diabetes treatment 02/27/2013  . Obstructive sleep apnea 02/25/2013  . Chronic systolic congestive heart failure ([place]) 05/21/2012  . Lichen sclerosus et atrophicus 05/20/2012  . Bronchitis, chronic obstructive ([place]) 05/20/2012  . Diabetes mellitus type 2 with peripheral  artery disease ([place]) 08/28/2011  . History of lumbar fusion 06/29/2011  . Depression 06/24/2011  . Hypothyroidism 05/03/2011  . CAD, multiple vessel 02/24/2011  . Tobacco abuse 02/24/2011  . Mixed hyperlipidemia 02/24/2011    Past Surgical History:  Procedure Laterality Date  . ABDOMINAL HYSTERECTOMY    . APPENDECTOMY    . BACK SURGERY    . CARDIAC CATHETERIZATION  2013  . CARDIAC CATHETERIZATION  2016  . CARDIAC CATHETERIZATION N/A 01/01/2016    Procedure: Left Heart Cath and Coronary Angiography;  Surgeon: Jettie Booze, MD;  Location: [facility];  Service: Cardiovascular;  Laterality: N/A;  . CATARACT EXTRACTION W/PHACO Left 01/30/2015   Procedure: CATARACT EXTRACTION PHACO AND INTRAOCULAR LENS PLACEMENT (IOC);  Surgeon: Birder Robson, MD;  Location: [facility];  Service: Ophthalmology;  Laterality: Left;  [place] 00:47   . CATARACT EXTRACTION W/PHACO Right 02/13/2015   Procedure: CATARACT EXTRACTION PHACO AND INTRAOCULAR LENS PLACEMENT (IOC);  Surgeon: Birder Robson, MD;  Location: [facility];  Service: Ophthalmology;  Laterality: Right;  cassette lot # [id number] H [place]  00:29.9 AP  20.7 CDE  6.20  . COLONOSCOPY N/A 11/02/2014   Procedure: COLONOSCOPY;  Surgeon: Inda Castle, MD;  Location: [place];  Service: Endoscopy;  Laterality: N/A;  . CORONARY ANGIOPLASTY  2012   stent x 3   . CORONARY ANGIOPLASTY WITH STENT PLACEMENT  2013    Prior to Admission medications   Medication Sig Start Date End Date Taking? Authorizing Provider  amitriptyline (ELAVIL) 50 MG tablet Take 1 tablet (50 mg total) by mouth at bedtime. 08/23/18   Vevelyn Francois, NP  aspirin 81 MG tablet Take 81 mg by mouth daily.      [provider]  carvedilol (COREG) 6.25 MG tablet Take 1 tablet (6.25 mg total) by mouth 2 (two) times daily. 05/17/18   Minna Merritts, MD  clopidogrel (PLAVIX) 75 MG tablet Take 1 tablet (75 mg total) by mouth daily. 05/17/18   Minna Merritts, MD  enalapril (VASOTEC) 20 MG tablet Take 1 tablet by mouth once daily for blood pressure. 05/17/18   Minna Merritts, MD  ezetimibe (ZETIA) 10 MG tablet Take 1 tablet (10 mg total) by mouth daily. 05/17/18   Minna Merritts, MD  gabapentin (NEURONTIN) 100 MG capsule Take 1 capsule (100 mg total) by mouth every 8 (eight) hours. 08/23/18 11/21/18  Vevelyn Francois, NP  gabapentin (NEURONTIN) 300 MG capsule Take 1 capsule (300 mg total) by mouth 3 (three) times daily. 01/22/18  05/17/18  Menshew, Dannielle Karvonen, PA-C  glucose blood (FREESTYLE LITE) test strip USE 1 STRIP TO CHECK BLOOD SUGAR 5 TIMES DAILY FOR UNCONTROLLED DIABETES MELLITUS 11/11/17   Philemon Kingdom, MD  glucose monitoring kit (FREESTYLE) monitoring kit 1 each by Does not apply route as needed for other. PHARMACY PLEASE SUBSTITUTE WHATEVER GLUCOMETER YOU CARRY 06/02/12   Crecencio Mc, MD  insulin aspart (NOVOLOG FLEXPEN) 100 UNIT/ML FlexPen Inject 14-22 Units into the skin 3 (three) times daily with meals. Normally takes 12 units 07/17/17   Philemon Kingdom, MD  Insulin Glargine (LANTUS SOLOSTAR) 100 UNIT/ML Solostar Pen Inject 50 Units into the skin at bedtime. 05/04/18   Philemon Kingdom, MD  Insulin Pen Needle (INSUPEN PEN NEEDLES) 32G X 4 MM MISC Use to inject insulin 4 times daily. 01/08/17   Philemon Kingdom, MD  isosorbide mononitrate (IMDUR) 30 MG 24 hr tablet Take 1 tablet (30 mg total) by mouth daily. 05/17/18  Minna Merritts, MD  ketorolac (TORADOL) 10 MG tablet Take 1 tablet (10 mg total) by mouth every 8 (eight) hours. Patient not taking: Reported on 08/23/2018 01/22/18   Menshew, Dannielle Karvonen, PA-C  levothyroxine (SYNTHROID, LEVOTHROID) 125 MCG tablet TAKE 1 TABLET BY MOUTH ONCE DAILY 06/08/18   Philemon Kingdom, MD  nystatin cream (MYCOSTATIN) Apply 1 application topically 2 (two) times daily. Apply a thin layer to clean and dry skin for up to 3 weeks. 02/10/18   Elby Beck, FNP  ondansetron (ZOFRAN-ODT) 8 MG disintegrating tablet Take 1 tablet (8 mg total) by mouth every 8 (eight) hours as needed for nausea. 12/28/17   Elby Beck, FNP  pantoprazole (PROTONIX) 40 MG tablet Take 1 tablet (40 mg total) by mouth daily. 05/17/18   Minna Merritts, MD  rosuvastatin (CRESTOR) 40 MG tablet Take 1 tablet (40 mg total) by mouth daily. 05/17/18 05/17/19  Minna Merritts, MD  traZODone (DESYREL) 100 MG tablet Take 1 tablet (100 mg total) by mouth at bedtime. 11/24/17   [name], NP    Allergies No known allergies  Family History  Problem Relation Age of Onset  . Heart attack Mother        First MI @ 43 - Died @ 42  . Heart disease Mother   . Heart disease Father        Died @ 79  . Throat cancer Brother   . Liver cancer Brother   . Colon cancer Sister     Social History Social History   Tobacco Use  . Smoking status: Current Every Day Smoker    Packs/day: 1.00    Years: 45.00    Pack years: 45.00    Types: Cigarettes  . Smokeless tobacco: Never Used  . Tobacco comment: Has cut back, trying to quit.   Substance Use Topics  . Alcohol use: No    Alcohol/week: 0.0 standard drinks  . Drug use: Yes    Types: Marijuana    Comment: last night 4.13.16    Review of Systems Constitutional: No fever/chills Eyes: No visual changes. ENT: No sore throat. Cardiovascular: Positive for chest discomfort.  Respiratory: Denies shortness of breath. Gastrointestinal: Positive for difficulty swallowing.   Genitourinary: Negative for dysuria. Musculoskeletal: Negative for back pain. Skin: Negative for rash. Neurological: Negative for headaches, focal weakness or numbness.  ____________________________________________   PHYSICAL EXAM:  VITAL SIGNS: ED Triage Vitals  Enc Vitals Group     BP 08/24/18 1408 108/88     Pulse Rate 08/24/18 1408 73     Resp 08/24/18 1408 20     Temp 08/24/18 1408 97.8 F (36.6 C)     Temp Source 08/24/18 1408 Oral     SpO2 08/24/18 1408 98 %     Weight 08/24/18 1409 145 lb (65.8 kg)     Height 08/24/18 1409 _0  (1.676 m)     Head Circumference --      Peak Flow --      Pain Score 08/24/18 1423 6   Constitutional: Alert and oriented.  Eyes: Conjunctivae are normal.  ENT      Head: Normocephalic and atraumatic.      Nose: No congestion/rhinnorhea.      Mouth/Throat: Mucous membranes are moist.      Neck: No stridor. Hematological/Lymphatic/Immunilogical: No cervical lymphadenopathy. Cardiovascular:  Normal rate, regular rhythm.  No murmurs, rubs, or gallops.  Respiratory: Normal respiratory effort without tachypnea nor retractions. Breath sounds  are clear and equal bilaterally. No wheezes/rales/rhonchi. Gastrointestinal: Soft and non tender. No rebound. No guarding.  Genitourinary: Deferred Musculoskeletal: Normal range of motion in all extremities. No lower extremity edema. Neurologic:  Normal speech and language. No gross focal neurologic deficits are appreciated.  Skin:  Skin is warm, dry and intact. No rash noted. Psychiatric: Mood and affect are normal. Speech and behavior are normal. Patient exhibits appropriate insight and judgment.  ____________________________________________    LABS (pertinent positives/negatives)  Trop <0.03 CBC wbc 10.9, hgb 14.3, plt 274 BMP wnl except glu 342, cr 1.18  ____________________________________________   EKG  I, Nance Pear, attending physician, personally viewed and interpreted this EKG  EKG Time: 1403 Rate: 70 Rhythm: normal sinus rhythm Axis: normal Intervals: qtc 475 QRS: narrow, q waves v1, v2, v3 ST changes: no st elevation Impression: abnormal ekg   ____________________________________________    RADIOLOGY  CXR No acute abnormality  ____________________________________________   PROCEDURES  Procedures  ____________________________________________   INITIAL IMPRESSION / ASSESSMENT AND PLAN / ED COURSE  Pertinent labs & imaging results that were available during my care of the patient were reviewed by me and considered in my medical decision making (see chart for details).   Patient presented to the emergency department today with concerns for some difficulty with swallowing.  Patient was also planing of some chest pain.  Work-up here without concerning elevation of troponin.  She did feel better after GI cocktail.  At this point I think esophagitis likely.  Also discussed with patient possibility of  esophageal strictures.  Do think she would benefit from GI evaluation so will give patient GI follow-up information.   ____________________________________________   FINAL CLINICAL IMPRESSION(S) / ED DIAGNOSES  Final diagnoses:  Nonspecific chest pain  Esophagitis  Dysphagia, unspecified type     Note: This dictation was prepared with Dragon dictation. Any transcriptional errors that result from this process are unintentional     Nance Pear, MD 08/24/18 1921

## 2018-08-24 NOTE — Discharge Instructions (Addendum)
Please seek medical attention for any high fevers, chest pain, shortness of breath, change in behavior, persistent vomiting, bloody stool or any other new or concerning symptoms.  

## 2018-08-24 NOTE — ED Notes (Signed)
ED Provider at bedside. 

## 2018-08-24 NOTE — ED Triage Notes (Signed)
Pt arrived with complaints of central chest pain that feels like a discomfort. Pt states the pain started with 2 episodes where food was stuck in pt's throat (this happened 2 weeks prior). Pt states with her significant heart history she wanted to come in and get checked due to the pain intensifying over the last 2 weeks.

## 2018-08-25 ENCOUNTER — Ambulatory Visit (INDEPENDENT_AMBULATORY_CARE_PROVIDER_SITE_OTHER): Payer: Medicare HMO | Admitting: Internal Medicine

## 2018-08-25 ENCOUNTER — Encounter: Payer: Self-pay | Admitting: Internal Medicine

## 2018-08-25 ENCOUNTER — Encounter: Payer: Self-pay | Admitting: Gastroenterology

## 2018-08-25 VITALS — BP 128/80 | HR 84 | Ht 66.0 in | Wt 143.0 lb

## 2018-08-25 DIAGNOSIS — E039 Hypothyroidism, unspecified: Secondary | ICD-10-CM | POA: Diagnosis not present

## 2018-08-25 DIAGNOSIS — E1151 Type 2 diabetes mellitus with diabetic peripheral angiopathy without gangrene: Secondary | ICD-10-CM | POA: Diagnosis not present

## 2018-08-25 DIAGNOSIS — E782 Mixed hyperlipidemia: Secondary | ICD-10-CM

## 2018-08-25 LAB — POCT GLYCOSYLATED HEMOGLOBIN (HGB A1C): Hemoglobin A1C: 13.1 % — AB (ref 4.0–5.6)

## 2018-08-25 MED ORDER — INSULIN GLARGINE 100 UNIT/ML SOLOSTAR PEN: 44.0000 [IU] | PEN_INJECTOR | Freq: Every day | SUBCUTANEOUS | 3 refills | Status: DC

## 2018-08-25 MED ORDER — FREESTYLE LIBRE 14 DAY SENSOR MISC: 1.0000 | 3 refills | Status: DC

## 2018-08-25 NOTE — Patient Instructions (Addendum)
Please continue: - Lantus 44 units at bedtime  Please increase: - Novolog   20 units before coffee 20 units before lunch 20 units before dinner If sugars before the meal are: 70 or lower, do not take NovoLog before that meal 71- 99, take no more than 15 units with that meal 100 or above, take the whole dose of insulin  Stop sodas!

## 2018-08-25 NOTE — Progress Notes (Signed)
Patient ID: Sarah Payne, female   DOB: September 14, 1945, 73 y.o.   MRN: [id number]  HPI: Sarah Payne is a 73 y.o.-year-old female, returning for f/u for DM2, dx 2003, insulin-dependent since 2013, uncontrolled, with complications (CAD, sCHF, PAD, CKD, PN) and also complicated by dementia and medication noncompliance, also hypothyroidism, uncontrolled. Last visit 4 months ago.  DM2: -Management hindered by medication noncompliance and soda drinking.  Last hemoglobin A1c: Lab Results  Component Value Date   HGBA1C 10.2 (A) 05/04/2018   HGBA1C 9.2 (A) 01/18/2018   HGBA1C 10.2 10/16/2017   Pt is on a regimen of: - Lantus 50 >> 44 units at bedtime - Novolog   10-14 >> 16 units before coffee 14 >> 16 units before lunch 18 >> 16 units before dinner If sugars before the meal are: 70 or lower, do not take NovoLog before that meal 71- 99, take no more than 12 units with that meal 100 or above, take the whole dose of insulin  Freestyle libre CGM parameters: - average: 254+/-78.4 >> 330 +86.8 - coefficient of variation: 30.9% >> 26.3%(19-25%) - time in range:  - low (<70): 0% >> 0% - normal (70-180): 17% >> 6% - high (>180): 83% >> 94%  Glucose 25-75%:  Lowest sugar was 73 >> 63 by CGM. Highest sugar was 400s >> 461.  -+ Mild CKD, last BUN/creatinine:  Lab Results  Component Value Date   BUN 17 08/24/2018   CREATININE 1.18 (H) 08/24/2018  On enalapril. -+ HL; last set of lipids: Lab Results  Component Value Date   CHOL 218 (H) 11/17/2017   HDL 43.00 11/17/2017   LDLCALC 146 (H) 03/12/2015   LDLDIRECT 152.0 11/17/2017   TRIG 201.0 (H) 11/17/2017   CHOLHDL 5 11/17/2017  She is on Crestor and Zetia, but missed many doses of the medicines before the above lipid panel returned. She is on ASA 81. - last eye exam was in 2016: No DR.  + History of cataract surgery.  She did not schedule an appointment yet despite multiple promptings. - + Numbness and tingling in her  feet.  Hypothyroidism: -Management hindered by medication noncompliance  Pt is on levothyroxine 125 mcg daily, taken: - in am - fasting - at least 30 min from b'fast - no Ca, Fe, MVI, PPIs - not on Biotin  Latest TSH was normal: Lab Results  Component Value Date   TSH 2.26 01/18/2018   TSH 10.50 (H) 11/17/2017   TSH 2.38 04/17/2017   TSH 0.07 (L) 09/18/2016   TSH 0.36 04/17/2016   TSH 0.24 (L) 02/06/2016   TSH 51.35 (H) 09/20/2015   TSH 0.52 06/18/2015   TSH 53.23 (H) 06/01/2015   TSH 40.79 (H) 03/12/2015   FREET4 0.95 01/18/2018   FREET4 0.78 04/17/2017   FREET4 0.97 09/18/2016   FREET4 1.21 04/17/2016   FREET4 2.16 (H) 02/06/2016   FREET4 0.27 (L) 09/20/2015   FREET4 0.57 (L) 03/12/2015   FREET4 0.21 (L) 12/21/2014   FREET4 0.30 (L) 08/16/2014   FREET4 1.03 04/26/2014   Pt denies: - feeling nodules in neck - hoarseness - dysphagia - choking - SOB with lying down  Her sister died, her nephew killed himself and she had an AMI in summer 2017.  ROS: Constitutional: no weight gain/no weight loss, no fatigue, no subjective hyperthermia, no subjective hypothermia Eyes: no blurry vision, no xerophthalmia ENT: no sore throat, + see HPI Cardiovascular: no CP/no SOB/no palpitations/no leg swelling Respiratory: no cough/no SOB/no wheezing Gastrointestinal:  no N/no V/no D/no C/no acid reflux Musculoskeletal: no muscle aches/no joint aches Skin: no rashes, no hair loss Neurological: no tremors/no numbness/no tingling/no dizziness  I reviewed pt's medications, allergies, PMH, social hx, family hx, and changes were documented in the history of present illness. Otherwise, unchanged from my initial visit note.  Past Medical History:  Diagnosis Date  . Cataract    BILATERAL REMOVED  . Chronic low back pain   . COPD (chronic obstructive pulmonary disease) ([place])   . Coronary artery disease    a. multiple PCIs to the mLCx. b. stent to dRCA 08/2011 in setting of NSTEMI. c.  DES to prox-mid LCx for ISR 09/2011. d.  DESx2 to prox-mRCA 07/2012. e. Takotsubo event 12/2015 with patent stents.  . Depression   . Diabetes mellitus   . Diverticulosis   . Frequent PVCs    a. Noted in hospital 12/2015.  Marland Kitchen GERD (gastroesophageal reflux disease)   . Hyperlipidemia   . Hypertension   . Hypothyroidism   . Impingement syndrome of left shoulder   . Marijuana abuse   . Myocardial infarction ([place])    x 5  . Sleep apnea    mild-does not use cpap  . Takotsubo cardiomyopathy    a. 12/2015 - nephew committed suicide 1 week prior, sister died the morning of presentation - initially called a STEMI; cath with patent stents. LVEF 25-30%.  . Tendonitis of left rotator cuff   . Tobacco abuse   . Vascular dementia    Past Surgical History:  Procedure Laterality Date  . ABDOMINAL HYSTERECTOMY    . APPENDECTOMY    . BACK SURGERY    . CARDIAC CATHETERIZATION  2013  . CARDIAC CATHETERIZATION  2016  . CARDIAC CATHETERIZATION N/A 01/01/2016   Procedure: Left Heart Cath and Coronary Angiography;  Surgeon: Jettie Booze, MD;  Location: [place] [facility];  Service: Cardiovascular;  Laterality: N/A;  . CATARACT EXTRACTION W/PHACO Left 01/30/2015   Procedure: CATARACT EXTRACTION PHACO AND INTRAOCULAR LENS PLACEMENT (IOC);  Surgeon: Birder Robson, MD;  Location: [facility];  Service: Ophthalmology;  Laterality: Left;  [place] 00:47   . CATARACT EXTRACTION W/PHACO Right 02/13/2015   Procedure: CATARACT EXTRACTION PHACO AND INTRAOCULAR LENS PLACEMENT (IOC);  Surgeon: Birder Robson, MD;  Location: [facility];  Service: Ophthalmology;  Laterality: Right;  cassette lot # [id number] H [place]  00:29.9 AP  20.7 CDE  6.20  . COLONOSCOPY N/A 11/02/2014   Procedure: COLONOSCOPY;  Surgeon: Inda Castle, MD;  Location: [place];  Service: Endoscopy;  Laterality: N/A;  . CORONARY ANGIOPLASTY  2012   stent x 3   . CORONARY ANGIOPLASTY WITH STENT PLACEMENT  2013   Social History   Socioeconomic History   . Marital status: Married    Spouse name: Not on file  . Number of children: Not on file  . Years of education: Not on file  . Highest education level: Not on file  Occupational History  . Not on file  Social Needs  . Financial resource strain: Not on file  . Food insecurity:    Worry: Not on file    Inability: Not on file  . Transportation needs:    Medical: Not on file    Non-medical: Not on file  Tobacco Use  . Smoking status: Current Every Day Smoker    Packs/day: 1.00    Years: 45.00    Pack years: 45.00    Types: Cigarettes  . Smokeless tobacco: Never Used  .  Tobacco comment: Has cut back, trying to quit.   Substance and Sexual Activity  . Alcohol use: No    Alcohol/week: 0.0 standard drinks  . Drug use: Yes    Types: Marijuana    Comment: last night [id number]  . Sexual activity: Not on file  Lifestyle  . Physical activity:    Days per week: Not on file    Minutes per session: Not on file  . Stress: Not on file  Relationships  . Social connections:    Talks on phone: Not on file    Gets together: Not on file    Attends religious service: Not on file    Active member of club or organization: Not on file    Attends meetings of clubs or organizations: Not on file    Relationship status: Not on file  . Intimate partner violence:    Fear of current or ex partner: Not on file    Emotionally abused: Not on file    Physically abused: Not on file    Forced sexual activity: Not on file  Other Topics Concern  . Not on file  Social History Narrative   Lives at home with her husband in [place].  Previously used marijuana - quit.      Regular exercise: no/ pain from a frozen rotator cuff   Caffeine use: coffee daily and pepsi      Does not have a living will.   Daughters and husband know her wishes- would desire CPR but not prolonged life support if futile   Current Outpatient Medications on File Prior to Visit  Medication Sig Dispense Refill  . amitriptyline  (ELAVIL) 50 MG tablet Take 1 tablet (50 mg total) by mouth at bedtime. 30 tablet 2  . aspirin 81 MG tablet Take 81 mg by mouth daily.      . carvedilol (COREG) 6.25 MG tablet Take 1 tablet (6.25 mg total) by mouth 2 (two) times daily. 180 tablet 3  . clopidogrel (PLAVIX) 75 MG tablet Take 1 tablet (75 mg total) by mouth daily. 90 tablet 3  . enalapril (VASOTEC) 20 MG tablet Take 1 tablet by mouth once daily for blood pressure. 90 tablet 3  . ezetimibe (ZETIA) 10 MG tablet Take 1 tablet (10 mg total) by mouth daily. 90 tablet 3  . famotidine (PEPCID) 40 MG tablet Take 1 tablet (40 mg total) by mouth every evening. 30 tablet 1  . gabapentin (NEURONTIN) 100 MG capsule Take 1 capsule (100 mg total) by mouth every 8 (eight) hours. 90 capsule 2  . gabapentin (NEURONTIN) 300 MG capsule Take 1 capsule (300 mg total) by mouth 3 (three) times daily. 90 capsule 0  . glucose blood (FREESTYLE LITE) test strip USE 1 STRIP TO CHECK BLOOD SUGAR 5 TIMES DAILY FOR UNCONTROLLED DIABETES MELLITUS 400 each 5  . glucose monitoring kit (FREESTYLE) monitoring kit 1 each by Does not apply route as needed for other. PHARMACY PLEASE SUBSTITUTE WHATEVER GLUCOMETER YOU CARRY 1 each 1  . insulin aspart (NOVOLOG FLEXPEN) 100 UNIT/ML FlexPen Inject 14-22 Units into the skin 3 (three) times daily with meals. Normally takes 12 units 15 pen 3  . Insulin Glargine (LANTUS SOLOSTAR) 100 UNIT/ML Solostar Pen Inject 50 Units into the skin at bedtime. 15 pen 3  . Insulin Pen Needle (INSUPEN PEN NEEDLES) 32G X 4 MM MISC Use to inject insulin 4 times daily. 250 each 1  . isosorbide mononitrate (IMDUR) 30 MG 24 hr tablet Take 1  tablet (30 mg total) by mouth daily. 90 tablet 3  . ketorolac (TORADOL) 10 MG tablet Take 1 tablet (10 mg total) by mouth every 8 (eight) hours. (Patient not taking: Reported on 08/23/2018) 15 tablet 0  . levothyroxine (SYNTHROID, LEVOTHROID) 125 MCG tablet TAKE 1 TABLET BY MOUTH ONCE DAILY 90 tablet 3  . nystatin cream  (MYCOSTATIN) Apply 1 application topically 2 (two) times daily. Apply a thin layer to clean and dry skin for up to 3 weeks. 30 g 0  . ondansetron (ZOFRAN-ODT) 8 MG disintegrating tablet Take 1 tablet (8 mg total) by mouth every 8 (eight) hours as needed for nausea. 20 tablet 0  . pantoprazole (PROTONIX) 40 MG tablet Take 1 tablet (40 mg total) by mouth daily. 90 tablet 3  . rosuvastatin (CRESTOR) 40 MG tablet Take 1 tablet (40 mg total) by mouth daily. 90 tablet 3  . sucralfate (CARAFATE) 1 g tablet Take 1 tablet (1 g total) by mouth 4 (four) times daily. 60 tablet 0  . traZODone (DESYREL) 100 MG tablet Take 1 tablet (100 mg total) by mouth at bedtime. 90 tablet 1   No current facility-administered medications on file prior to visit.    Allergies  Allergen Reactions  . No Known Allergies    Family History  Problem Relation Age of Onset  . Heart attack Mother        First MI @ 69 - Died @ 53  . Heart disease Mother   . Heart disease Father        Died @ 44  . Throat cancer Brother   . Liver cancer Brother   . Colon cancer Sister     PE: BP 128/80   Pulse 84   Ht _0  (1.676 m) Comment: measured  Wt 143 lb (64.9 kg)   SpO2 97%   BMI 23.08 kg/m  Body mass index is 23.08 kg/m. Wt Readings from Last 3 Encounters:  08/25/18 143 lb (64.9 kg)  08/24/18 145 lb (65.8 kg)  08/23/18 145 lb (65.8 kg)   Constitutional: Normal weight, in NAD Eyes: PERRLA, EOMI, no exophthalmos ENT: moist mucous membranes, no thyromegaly, no cervical lymphadenopathy Cardiovascular: RRR, No MRG Respiratory: CTA B Gastrointestinal: abdomen soft, NT, ND, BS+ Musculoskeletal: no deformities, strength intact in all 4 Skin: moist, warm, no rashes Neurological: no tremor with outstretched hands, DTR normal in all 4  ASSESSMENT: 1. DM2, insulin-dependent, uncontrolled, with complications - med noncompliance (dementia) - CAD - had 3 AMI in 2011 >> 7 stents placed; new AMI 2016-02-16 after her sister's  death - sCHF - PAD - CKD - PN  2. Hypothyroidism - Uncontrolled  3. HL  PLAN:  1. Patient with longstanding, uncontrolled, type 2 diabetes, insulin-dependent, with history of medication and visit noncompliance.  At each visit we discussed about the importance of taking all the NovoLog doses as prescribed, before each meal.  However, she is still missing a lot of NovoLog doses.  At last visit she again ran out of Lantus and was off the medication for a week.  He did not let us know about it.  Even though she restarted Lantus right before last visit, sugars were still very high in the 200s to 400s.  At that time, we increased her insulin doses.  As her sugars were increasing significantly after her morning coffee, I advised her to bolus NovoLog before this, also.  Of note, she usually does not eat breakfast. -At this visit, her sugars are extremely  high. She is drinking regular sodas.  At this point, we discussed that I have been seeing her for >5 years without any progress in her diabetes care.  She is either not taking her medicines as prescribed or running out of them.  Now she also mentions that she is drinking regular sodas despite advice in the past about stopping these.  Due to the lack of progress over the last years, we discussed that I do not feel that I can help her manage her diabetes anymore.  We will increase the doses of her NovoLog for now and strongly advised her to stop sodas.  We will not schedule another appointment. - I suggested to:  Patient Instructions  Please continue: - Lantus 44 units at bedtime  Please increase: - Novolog   20 units before coffee 20 units before lunch 20 units before dinner If sugars before the meal are: 70 or lower, do not take NovoLog before that meal 71- 99, take no more than 15 units with that meal 100 or above, take the whole dose of insulin  Stop sodas!  - today, HbA1c is 13.1% (higher, terrible) - continue checking sugars at different  times of the day - check 4x a day, rotating checks - advised for yearly eye exams >> she is not UTD      2. Hypothyroidism - Patient with very uncontrolled hypothyroidism due to poor medication compliance.  Since last visit, she mentions that she took it daily. - latest thyroid labs reviewed with pt >> normal 01/2018 - she continues on LT4 125 Mcg daily - pt feels good on this dose. - we discussed about taking the thyroid hormone every day, with water, >30 minutes before breakfast, separated by >4 hours from acid reflux medications, calcium, iron, multivitamins. Pt. is taking it correctly. - will check thyroid tests today: TSH and fT4 - If labs are abnormal, she will need to return for repeat TFTs in 1.5 months  3. HL - Reviewed latest lipid panel from 10/2017: LDL high, triglycerides also high.  She admitted for missing medication doses Lab Results  Component Value Date   CHOL 218 (H) 11/17/2017   HDL 43.00 11/17/2017   LDLCALC 146 (H) 03/12/2015   LDLDIRECT 152.0 11/17/2017   TRIG 201.0 (H) 11/17/2017   CHOLHDL 5 11/17/2017  - Continues Crestor and Zetia without side effects.  Pt did not stop at the lab...  Philemon Kingdom, MD PhD [facility] End[facility]

## 2018-09-01 ENCOUNTER — Encounter: Payer: Self-pay | Admitting: Internal Medicine

## 2018-09-07 ENCOUNTER — Encounter: Payer: Self-pay | Admitting: Primary Care

## 2018-09-07 ENCOUNTER — Ambulatory Visit (INDEPENDENT_AMBULATORY_CARE_PROVIDER_SITE_OTHER): Payer: Medicare HMO | Admitting: Primary Care

## 2018-09-07 DIAGNOSIS — E1151 Type 2 diabetes mellitus with diabetic peripheral angiopathy without gangrene: Secondary | ICD-10-CM

## 2018-09-07 MED ORDER — FREESTYLE LIBRE 14 DAY SENSOR MISC: 1.0000 | 3 refills | Status: DC

## 2018-09-07 MED ORDER — INSULIN ASPART 100 UNIT/ML FLEXPEN: 20.0000 [IU] | PEN_INJECTOR | Freq: Three times a day (TID) | SUBCUTANEOUS | 3 refills | Status: DC

## 2018-09-07 NOTE — Patient Instructions (Signed)
I sent refills for the [facility] sensor and your Novolog.  You will be contacted regarding your referral to endocrinology.  Please let us know if you have not been contacted within one week.   Start checking your blood sugars 3-4 times daily as discussed. Complete the glucose log sheet as provided.  Resume Lantus 44 units at bedtime and Novolog 20 units three times daily with meals. Do not inject if you see blood sugars less than 70.  Stop drinking regular sodas. Take a look at the information below.  Schedule a follow up visit for 2 weeks for diabetes checks, bring your glucose logs.  It was a pleasure to see you today!    Diabetes Mellitus and Nutrition, Adult When you have diabetes (diabetes mellitus), it is very important to have healthy eating habits because your blood sugar (glucose) levels are greatly affected by what you eat and drink. Eating healthy foods in the appropriate amounts, at about the same times every day, can help you:  Control your blood glucose.  Lower your risk of heart disease.  Improve your blood pressure.  Reach or maintain a healthy weight. Every person with diabetes is different, and each person has different needs for a meal plan. Your health care provider may recommend that you work with a diet and nutrition specialist (dietitian) to make a meal plan that is best for you. Your meal plan may vary depending on factors such as:  The calories you need.  The medicines you take.  Your weight.  Your blood glucose, blood pressure, and cholesterol levels.  Your activity level.  Other health conditions you have, such as heart or kidney disease. How do carbohydrates affect me? Carbohydrates, also called carbs, affect your blood glucose level more than any other type of food. Eating carbs naturally raises the amount of glucose in your blood. Carb counting is a method for keeping track of how many carbs you eat. Counting carbs is important to keep your blood  glucose at a healthy level, especially if you use insulin or take certain oral diabetes medicines. It is important to know how many carbs you can safely have in each meal. This is different for every person. Your dietitian can help you calculate how many carbs you should have at each meal and for each snack. Foods that contain carbs include:  Bread, cereal, rice, pasta, and crackers.  Potatoes and corn.  Peas, beans, and lentils.  Milk and yogurt.  Fruit and juice.  Desserts, such as cakes, cookies, ice cream, and candy. How does alcohol affect me? Alcohol can cause a sudden decrease in blood glucose (hypoglycemia), especially if you use insulin or take certain oral diabetes medicines. Hypoglycemia can be a life-threatening condition. Symptoms of hypoglycemia (sleepiness, dizziness, and confusion) are similar to symptoms of having too much alcohol. If your health care provider says that alcohol is safe for you, follow these guidelines:  Limit alcohol intake to no more than 1 drink per day for nonpregnant women and 2 drinks per day for men. One drink equals 12 oz of beer, 5 oz of wine, or 1 oz of hard liquor.  Do not drink on an empty stomach.  Keep yourself hydrated with water, diet soda, or unsweetened iced tea.  Keep in mind that regular soda, juice, and other mixers may contain a lot of sugar and must be counted as carbs. What are tips for following this plan?  Reading food labels  Start by checking the serving size on  the "Nutrition Facts" label of packaged foods and drinks. The amount of calories, carbs, fats, and other nutrients listed on the label is based on one serving of the item. Many items contain more than one serving per package.  Check the total grams (g) of carbs in one serving. You can calculate the number of servings of carbs in one serving by dividing the total carbs by 15. For example, if a food has 30 g of total carbs, it would be equal to 2 servings of  carbs.  Check the number of grams (g) of saturated and trans fats in one serving. Choose foods that have low or no amount of these fats.  Check the number of milligrams (mg) of salt (sodium) in one serving. Most people should limit total sodium intake to less than 2,300 mg per day.  Always check the nutrition information of foods labeled as "low-fat" or "nonfat". These foods may be higher in added sugar or refined carbs and should be avoided.  Talk to your dietitian to identify your daily goals for nutrients listed on the label. Shopping  Avoid buying canned, premade, or processed foods. These foods tend to be high in fat, sodium, and added sugar.  Shop around the outside edge of the grocery store. This includes fresh fruits and vegetables, bulk grains, fresh meats, and fresh dairy. Cooking  Use low-heat cooking methods, such as baking, instead of high-heat cooking methods like deep frying.  Cook using healthy oils, such as olive, canola, or sunflower oil.  Avoid cooking with butter, cream, or high-fat meats. Meal planning  Eat meals and snacks regularly, preferably at the same times every day. Avoid going long periods of time without eating.  Eat foods high in fiber, such as fresh fruits, vegetables, beans, and whole grains. Talk to your dietitian about how many servings of carbs you can eat at each meal.  Eat 4-6 ounces (oz) of lean protein each day, such as lean meat, chicken, fish, eggs, or tofu. One oz of lean protein is equal to: ? 1 oz of meat, chicken, or fish. ? 1 egg. ?  cup of tofu.  Eat some foods each day that contain healthy fats, such as avocado, nuts, seeds, and fish. Lifestyle  Check your blood glucose regularly.  Exercise regularly as told by your health care provider. This may include: ? 150 minutes of moderate-intensity or vigorous-intensity exercise each week. This could be brisk walking, biking, or water aerobics. ? Stretching and doing strength  exercises, such as yoga or weightlifting, at least 2 times a week.  Take medicines as told by your health care provider.  Do not use any products that contain nicotine or tobacco, such as cigarettes and e-cigarettes. If you need help quitting, ask your health care provider.  Work with a Social worker or diabetes educator to identify strategies to manage stress and any emotional and social challenges. Questions to ask a health care provider  Do I need to meet with a diabetes educator?  Do I need to meet with a dietitian?  What number can I call if I have questions?  When are the best times to check my blood glucose? Where to find more information:  American Diabetes Association: diabetes.org  Academy of Nutrition and Dietetics: www.eatright.[employer] of Diabetes and Digestive and Kidney Diseases (NIH): [employer] Summary  A healthy meal plan will help you control your blood glucose and maintain a healthy lifestyle.  Working with a diet and nutrition specialist (dietitian) can  help you make a meal plan that is best for you.  Keep in mind that carbohydrates (carbs) and alcohol have immediate effects on your blood glucose levels. It is important to count carbs and to use alcohol carefully. This information is not intended to replace advice given to you by your health care provider. Make sure you discuss any questions you have with your health care provider. Document Released: 04/03/2005 Document Revised: 02/04/2017 Document Reviewed: 08/11/2016 Elsevier Interactive Patient Education  2019 [employer].

## 2018-09-07 NOTE — Progress Notes (Signed)
Subjective:    Patient ID: Sarah Payne, female    DOB: 03/13/1946, 73 y.o.   MRN: [id number]  HPI  Sarah Payne is a 73 year old female who presents today to discuss type 2 diabetes.  She was following with endocrinology (Dr. Cruzita Lederer) for the last five years with her last visit being 08/25/18. During that visit it was noted several times that she was non compliant to her medication regimen, continues to follow an unhealthy diet despite recommendations, and has failed to schedule follow up visits as requested. Her last visit note also stated that her diabetes care would no longer be handled by her endocrinologist as she's had lack of progress over the last five years due to reasons mentioned above. It was recommended (again) that she stop drinking regular sodas. Her Lantus of 44 units was continued at bedtime, her Novolog was increased to 20 units before coffee, lunch, dinner. Her A1C that day was 13.1.  A1C from February 2020: 13.1 A1C from October 2019: 10.2 A1C from July 2019: 9.2 A1C from March 2019: 10.2 A1C from December 2018: 10.8  Today she endorses that she is weaning herself off of soda, she had one soda today. She is working on increasing her water intake, she does not like diet soda. She is compliant to her Lantus and is injecting 44 units nightly and Novolog 20 units three times daily.  She ran out of her Novolog 3-4 days ago, she's been adding in an extra 20 units of Lantus in the morning along with her 44 units in the evening.  She is not checking her glucose levels as she ran out of the [place] sensors.   Wt Readings from Last 3 Encounters:  09/07/18 136 lb 8 oz (61.9 kg)  08/25/18 143 lb (64.9 kg)  08/24/18 145 lb (65.8 kg)     Review of Systems  Constitutional: Positive for fatigue.  Neurological: Positive for numbness. Negative for dizziness and headaches.       Past Medical History:  Diagnosis Date  . Cataract    BILATERAL REMOVED  . Chronic low back pain   .  COPD (chronic obstructive pulmonary disease) ([place])   . Coronary artery disease    a. multiple PCIs to the mLCx. b. stent to dRCA 08/2011 in setting of NSTEMI. c. DES to prox-mid LCx for ISR 09/2011. d.  DESx2 to prox-mRCA 07/2012. e. Takotsubo event 12/2015 with patent stents.  . Depression   . Diabetes mellitus   . Diverticulosis   . Frequent PVCs    a. Noted in hospital 12/2015.  Marland Kitchen GERD (gastroesophageal reflux disease)   . Hyperlipidemia   . Hypertension   . Hypothyroidism   . Impingement syndrome of left shoulder   . Marijuana abuse   . Myocardial infarction ([place])    x 5  . Sleep apnea    mild-does not use cpap  . Takotsubo cardiomyopathy    a. 12/2015 - nephew committed suicide 1 week prior, sister died the morning of presentation - initially called a STEMI; cath with patent stents. LVEF 25-30%.  . Tendonitis of left rotator cuff   . Tobacco abuse   . Vascular dementia      Social History   Socioeconomic History  . Marital status: Married    Spouse name: Not on file  . Number of children: Not on file  . Years of education: Not on file  . Highest education level: Not on file  Occupational History  .  Not on file  Social Needs  . Financial resource strain: Not on file  . Food insecurity:    Worry: Not on file    Inability: Not on file  . Transportation needs:    Medical: Not on file    Non-medical: Not on file  Tobacco Use  . Smoking status: Current Every Day Smoker    Packs/day: 1.00    Years: 45.00    Pack years: 45.00    Types: Cigarettes  . Smokeless tobacco: Never Used  . Tobacco comment: Has cut back, trying to quit.   Substance and Sexual Activity  . Alcohol use: No    Alcohol/week: 0.0 standard drinks  . Drug use: Yes    Types: Marijuana    Comment: last night 4.13.16  . Sexual activity: Not on file  Lifestyle  . Physical activity:    Days per week: Not on file    Minutes per session: Not on file  . Stress: Not on file  Relationships  . Social  connections:    Talks on phone: Not on file    Gets together: Not on file    Attends religious service: Not on file    Active member of club or organization: Not on file    Attends meetings of clubs or organizations: Not on file    Relationship status: Not on file  . Intimate partner violence:    Fear of current or ex partner: Not on file    Emotionally abused: Not on file    Physically abused: Not on file    Forced sexual activity: Not on file  Other Topics Concern  . Not on file  Social History Narrative   Lives at home with her husband in [place].  Previously used marijuana - quit.      Regular exercise: no/ pain from a frozen rotator cuff   Caffeine use: coffee daily and pepsi      Does not have a living will.   Daughters and husband know her wishes- would desire CPR but not prolonged life support if futile    Past Surgical History:  Procedure Laterality Date  . ABDOMINAL HYSTERECTOMY    . APPENDECTOMY    . BACK SURGERY    . CARDIAC CATHETERIZATION  2013  . CARDIAC CATHETERIZATION  2016  . CARDIAC CATHETERIZATION N/A 01/01/2016   Procedure: Left Heart Cath and Coronary Angiography;  Surgeon: Jettie Booze, MD;  Location: [facility];  Service: Cardiovascular;  Laterality: N/A;  . CATARACT EXTRACTION W/PHACO Left 01/30/2015   Procedure: CATARACT EXTRACTION PHACO AND INTRAOCULAR LENS PLACEMENT (IOC);  Surgeon: Birder Robson, MD;  Location: [facility];  Service: Ophthalmology;  Laterality: Left;  [place] 00:47   . CATARACT EXTRACTION W/PHACO Right 02/13/2015   Procedure: CATARACT EXTRACTION PHACO AND INTRAOCULAR LENS PLACEMENT (IOC);  Surgeon: Birder Robson, MD;  Location: [facility];  Service: Ophthalmology;  Laterality: Right;  cassette lot # [id number] H [place]  00:29.9 AP  20.7 CDE  6.20  . COLONOSCOPY N/A 11/02/2014   Procedure: COLONOSCOPY;  Surgeon: Inda Castle, MD;  Location: [facility];  Service: Endoscopy;  Laterality: N/A;  . CORONARY ANGIOPLASTY  2012    stent x 3   . CORONARY ANGIOPLASTY WITH STENT PLACEMENT  2013    Family History  Problem Relation Age of Onset  . Heart attack Mother        First MI @ 6 - Died @ 26  . Heart disease Mother   . Heart  disease Father        Died @ 52  . Throat cancer Brother   . Liver cancer Brother   . Colon cancer Sister     Allergies  Allergen Reactions  . No Known Allergies     Current Outpatient Medications on File Prior to Visit  Medication Sig Dispense Refill  . amitriptyline (ELAVIL) 50 MG tablet Take 1 tablet (50 mg total) by mouth at bedtime. 30 tablet 2  . aspirin 81 MG tablet Take 81 mg by mouth daily.      . carvedilol (COREG) 6.25 MG tablet Take 1 tablet (6.25 mg total) by mouth 2 (two) times daily. 180 tablet 3  . clopidogrel (PLAVIX) 75 MG tablet Take 1 tablet (75 mg total) by mouth daily. 90 tablet 3  . enalapril (VASOTEC) 20 MG tablet Take 1 tablet by mouth once daily for blood pressure. 90 tablet 3  . ezetimibe (ZETIA) 10 MG tablet Take 1 tablet (10 mg total) by mouth daily. 90 tablet 3  . famotidine (PEPCID) 40 MG tablet Take 1 tablet (40 mg total) by mouth every evening. 30 tablet 1  . gabapentin (NEURONTIN) 100 MG capsule Take 1 capsule (100 mg total) by mouth every 8 (eight) hours. 90 capsule 2  . glucose blood (FREESTYLE LITE) test strip USE 1 STRIP TO CHECK BLOOD SUGAR 5 TIMES DAILY FOR UNCONTROLLED DIABETES MELLITUS 400 each 5  . glucose monitoring kit (FREESTYLE) monitoring kit 1 each by Does not apply route as needed for other. PHARMACY PLEASE SUBSTITUTE WHATEVER GLUCOMETER YOU CARRY 1 each 1  . Insulin Glargine (LANTUS SOLOSTAR) 100 UNIT/ML Solostar Pen Inject 44 Units into the skin at bedtime. 15 pen 3  . Insulin Pen Needle (INSUPEN PEN NEEDLES) 32G X 4 MM MISC Use to inject insulin 4 times daily. 250 each 1  . isosorbide mononitrate (IMDUR) 30 MG 24 hr tablet Take 1 tablet (30 mg total) by mouth daily. 90 tablet 3  . ketorolac (TORADOL) 10 MG tablet Take 1 tablet (10  mg total) by mouth every 8 (eight) hours. 15 tablet 0  . levothyroxine (SYNTHROID, LEVOTHROID) 125 MCG tablet TAKE 1 TABLET BY MOUTH ONCE DAILY 90 tablet 3  . nystatin cream (MYCOSTATIN) Apply 1 application topically 2 (two) times daily. Apply a thin layer to clean and dry skin for up to 3 weeks. 30 g 0  . ondansetron (ZOFRAN-ODT) 8 MG disintegrating tablet Take 1 tablet (8 mg total) by mouth every 8 (eight) hours as needed for nausea. 20 tablet 0  . pantoprazole (PROTONIX) 40 MG tablet Take 1 tablet (40 mg total) by mouth daily. 90 tablet 3  . rosuvastatin (CRESTOR) 40 MG tablet Take 1 tablet (40 mg total) by mouth daily. 90 tablet 3  . sucralfate (CARAFATE) 1 g tablet Take 1 tablet (1 g total) by mouth 4 (four) times daily. 60 tablet 0  . traZODone (DESYREL) 100 MG tablet Take 1 tablet (100 mg total) by mouth at bedtime. 90 tablet 1  . gabapentin (NEURONTIN) 300 MG capsule Take 1 capsule (300 mg total) by mouth 3 (three) times daily. 90 capsule 0   No current facility-administered medications on file prior to visit.     BP 136/86   Pulse 81   Temp 98.2 F (36.8 C) (Oral)   Ht 5' 6" (1.676 m)   Wt 136 lb 8 oz (61.9 kg)   SpO2 97%   BMI 22.03 kg/m    Objective:   Physical Exam  Constitutional: She appears well-nourished.  Cardiovascular: Normal rate and regular rhythm.  Respiratory: Effort normal. She has no wheezes. She has no rales.  Wet cough during exam  Skin: Skin is warm and dry.  Psychiatric: She has a normal mood and affect.           Assessment & Plan:

## 2018-09-07 NOTE — Assessment & Plan Note (Signed)
Uncontrolled, recently dismissed by current endocrinologist. We had a long discussion today regarding the importance of compliance and follow up, she verbalized understanding. We will refer her to another endocrinologist but in the mean time will manage her here.  I sent refills for her Novolog and [place] sensor.  We cannot make any adjustments as she is not checking glucose. She will start now. Continue Lantus 44 units HS and Novolog 20 units TID as prescribed. We will see her back in 2 weeks with glucose logs. She verbalized understanding and will follow up.

## 2018-09-09 ENCOUNTER — Other Ambulatory Visit: Payer: Self-pay

## 2018-09-09 ENCOUNTER — Ambulatory Visit (INDEPENDENT_AMBULATORY_CARE_PROVIDER_SITE_OTHER): Payer: Medicare HMO | Admitting: Gastroenterology

## 2018-09-09 ENCOUNTER — Ambulatory Visit: Payer: Medicare HMO | Admitting: Gastroenterology

## 2018-09-09 ENCOUNTER — Encounter: Payer: Self-pay | Admitting: Gastroenterology

## 2018-09-09 VITALS — BP 106/65 | HR 67 | Ht 66.0 in | Wt 139.2 lb

## 2018-09-09 DIAGNOSIS — R1310 Dysphagia, unspecified: Secondary | ICD-10-CM

## 2018-09-09 NOTE — Progress Notes (Signed)
Sarah Bellows MD, MRCP(U.K) [address]  [place]  [place], [place] [address]  Main: [phone]  Fax: [phone]   Gastroenterology Consultation  Referring Provider:     [name], NP Primary Care Physician:  [name], NP Primary Gastroenterologist:  Dr. Jonathon Payne  Reason for Consultation:     Dysphagia         HPI:   Sarah Payne is a 73 y.o. y/o female referred for consultation & management  by Dr. Carlis Abbott, Leticia Penna, NP.    She has been previously a patient of [facility] last seen in 2017 for nausea or vomiting . Concern for diabetic gastroparesis in the past . Last EGD in 10/2015 that showed no abnormalities. Never had any heartburn.   She says she started having issues with swallowing water a month back, a week later was eating a taco , had a piece of shell get stuck, finally went down. Had a sore throat for a few days.  Says diabetes poorly controlled. She lost 9 lbs , she is a smoker. She is on blood thinners. No family history of esophageal cancer. Sister had her throat stretched. Does not have lower jaw teeth. No history of asthma or allergies. She is on famotidine. Seen at the ER recently.      Past Medical History:  Diagnosis Date  . Cataract    BILATERAL REMOVED  . Chronic low back pain   . COPD (chronic obstructive pulmonary disease) ([place])   . Coronary artery disease    a. multiple PCIs to the mLCx. b. stent to dRCA 08/2011 in setting of NSTEMI. c. DES to prox-mid LCx for ISR 09/2011. d.  DESx2 to prox-mRCA 07/2012. e. Takotsubo event 12/2015 with patent stents.  . Depression   . Diabetes mellitus   . Diverticulosis   . Frequent PVCs    a. Noted in hospital 12/2015.  Marland Kitchen GERD (gastroesophageal reflux disease)   . Hyperlipidemia   . Hypertension   . Hypothyroidism   . Impingement syndrome of left shoulder   . Marijuana abuse   . Myocardial infarction ([place])    x 5  . Sleep apnea    mild-does not use cpap  . Takotsubo cardiomyopathy    a.  12/2015 - nephew committed suicide 1 week prior, sister died the morning of presentation - initially called a STEMI; cath with patent stents. LVEF 25-30%.  . Tendonitis of left rotator cuff   . Tobacco abuse   . Vascular dementia     Past Surgical History:  Procedure Laterality Date  . ABDOMINAL HYSTERECTOMY    . APPENDECTOMY    . BACK SURGERY    . CARDIAC CATHETERIZATION  2013  . CARDIAC CATHETERIZATION  2016  . CARDIAC CATHETERIZATION N/A 01/01/2016   Procedure: Left Heart Cath and Coronary Angiography;  Surgeon: Jettie Booze, MD;  Location: [facility];  Service: Cardiovascular;  Laterality: N/A;  . CATARACT EXTRACTION W/PHACO Left 01/30/2015   Procedure: CATARACT EXTRACTION PHACO AND INTRAOCULAR LENS PLACEMENT (IOC);  Surgeon: Birder Robson, MD;  Location: [facility];  Service: Ophthalmology;  Laterality: Left;  [place] 00:47   . CATARACT EXTRACTION W/PHACO Right 02/13/2015   Procedure: CATARACT EXTRACTION PHACO AND INTRAOCULAR LENS PLACEMENT (IOC);  Surgeon: Birder Robson, MD;  Location: [facility];  Service: Ophthalmology;  Laterality: Right;  cassette lot # [id number] H [place]  00:29.9 AP  20.7 CDE  6.20  . COLONOSCOPY N/A 11/02/2014   Procedure: COLONOSCOPY;  Surgeon: Herbie Baltimore  Shaaron Adler, MD;  Location: [facility];  Service: Endoscopy;  Laterality: N/A;  . CORONARY ANGIOPLASTY  2012   stent x 3   . CORONARY ANGIOPLASTY WITH STENT PLACEMENT  2013    Prior to Admission medications   Medication Sig Start Date End Date Taking? Authorizing Provider  amitriptyline (ELAVIL) 50 MG tablet Take 1 tablet (50 mg total) by mouth at bedtime. 08/23/18  Yes Vevelyn Francois, NP  aspirin 81 MG tablet Take 81 mg by mouth daily.     Yes [provider]  carvedilol (COREG) 6.25 MG tablet Take 1 tablet (6.25 mg total) by mouth 2 (two) times daily. 05/17/18  Yes Minna Merritts, MD  clopidogrel (PLAVIX) 75 MG tablet Take 1 tablet (75 mg total) by mouth daily. 05/17/18  Yes Gollan, Kathlene November, MD   Continuous Blood Gluc Sensor (FREESTYLE LIBRE 14 DAY SENSOR) MISC 1 each by Does not apply route every 14 (fourteen) days. Change every 2 weeks 09/07/18  Yes [name], NP  enalapril (VASOTEC) 20 MG tablet Take 1 tablet by mouth once daily for blood pressure. 05/17/18  Yes Minna Merritts, MD  ezetimibe (ZETIA) 10 MG tablet Take 1 tablet (10 mg total) by mouth daily. 05/17/18  Yes Minna Merritts, MD  famotidine (PEPCID) 40 MG tablet Take 1 tablet (40 mg total) by mouth every evening. 08/24/18 08/24/19 Yes Nance Pear, MD  gabapentin (NEURONTIN) 100 MG capsule Take 1 capsule (100 mg total) by mouth every 8 (eight) hours. 08/23/18 11/21/18 Yes King, Crystal M, NP  glucose blood (FREESTYLE LITE) test strip USE 1 STRIP TO CHECK BLOOD SUGAR 5 TIMES DAILY FOR UNCONTROLLED DIABETES MELLITUS 11/11/17  Yes Philemon Kingdom, MD  glucose monitoring kit (FREESTYLE) monitoring kit 1 each by Does not apply route as needed for other. PHARMACY PLEASE SUBSTITUTE WHATEVER GLUCOMETER YOU CARRY 06/02/12  Yes Crecencio Mc, MD  insulin aspart (NOVOLOG FLEXPEN) 100 UNIT/ML FlexPen Inject 20 Units into the skin 3 (three) times daily with meals. 09/07/18  Yes [name], NP  Insulin Glargine (LANTUS SOLOSTAR) 100 UNIT/ML Solostar Pen Inject 44 Units into the skin at bedtime. 08/25/18  Yes Philemon Kingdom, MD  Insulin Pen Needle (INSUPEN PEN NEEDLES) 32G X 4 MM MISC Use to inject insulin 4 times daily. 01/08/17  Yes Philemon Kingdom, MD  isosorbide mononitrate (IMDUR) 30 MG 24 hr tablet Take 1 tablet (30 mg total) by mouth daily. 05/17/18  Yes Gollan, Kathlene November, MD  ketorolac (TORADOL) 10 MG tablet Take 1 tablet (10 mg total) by mouth every 8 (eight) hours. 01/22/18  Yes Menshew, Dannielle Karvonen, PA-C  levothyroxine (SYNTHROID, LEVOTHROID) 125 MCG tablet TAKE 1 TABLET BY MOUTH ONCE DAILY 06/08/18  Yes Philemon Kingdom, MD  ondansetron (ZOFRAN-ODT) 8 MG disintegrating tablet Take 1 tablet (8 mg total) by mouth  every 8 (eight) hours as needed for nausea. 12/28/17  Yes Elby Beck, FNP  pantoprazole (PROTONIX) 40 MG tablet Take 1 tablet (40 mg total) by mouth daily. 05/17/18  Yes Minna Merritts, MD  rosuvastatin (CRESTOR) 40 MG tablet Take 1 tablet (40 mg total) by mouth daily. 05/17/18 05/17/19 Yes Gollan, Kathlene November, MD  sucralfate (CARAFATE) 1 g tablet Take 1 tablet (1 g total) by mouth 4 (four) times daily. 08/24/18  Yes Nance Pear, MD  traZODone (DESYREL) 100 MG tablet Take 1 tablet (100 mg total) by mouth at bedtime. 11/24/17  Yes [name], NP  gabapentin (NEURONTIN) 300 MG capsule  Take 1 capsule (300 mg total) by mouth 3 (three) times daily. 01/22/18 05/17/18  Menshew, Dannielle Karvonen, PA-C  nystatin cream (MYCOSTATIN) Apply 1 application topically 2 (two) times daily. Apply a thin layer to clean and dry skin for up to 3 weeks. Patient not taking: Reported on 09/09/2018 02/10/18   [place]by Beck, FNP    Family History  Problem Relation Age of Onset  . Heart attack Mother        First MI @ 31 - Died @ 44  . Heart disease Mother   . Heart disease Father        Died @ 62  . Throat cancer Brother   . Liver cancer Brother   . Colon cancer Sister      Social History   Tobacco Use  . Smoking status: Current Every Day Smoker    Packs/day: 1.00    Years: 45.00    Pack years: 45.00    Types: Cigarettes  . Smokeless tobacco: Never Used  . Tobacco comment: Has cut back, trying to quit.   Substance Use Topics  . Alcohol use: No    Alcohol/week: 0.0 standard drinks  . Drug use: Yes    Types: Marijuana    Comment: last night 4.13.16    Allergies as of 09/09/2018 - Review Complete 09/09/2018  Allergen Reaction Noted  . No known allergies  08/31/2013    Review of Systems:    All systems reviewed and negative except where noted in HPI.   Physical Exam:  BP 106/65   Pulse 67   Ht _0  (1.676 m)   Wt 139 lb 3.2 oz (63.1 kg)   BMI 22.47 kg/m  No LMP recorded.  Patient has had a hysterectomy. Psych:  Alert and cooperative. Normal mood and affect. General:   Alert,  Well-developed, well-nourished, pleasant and cooperative in NAD Head:  Normocephalic and atraumatic. Eyes:  Sclera clear, no icterus.   Conjunctiva pink. Ears:  Normal auditory acuity. Nose:  No deformity, discharge, or lesions. Mouth:  No deformity or lesions,oropharynx pink & moist. Neck:  Supple; no masses or thyromegaly. Lungs:  Respirations even and unlabored.  Clear throughout to auscultation.   No wheezes, crackles, or rhonchi. No acute distress. Heart:  Regular rate and rhythm; no murmurs, clicks, rubs, or gallops. Abdomen:  Normal bowel sounds.  No bruits.  Soft, non-tender and non-distended without masses, hepatosplenomegaly or hernias noted.  No guarding or rebound tenderness.    Neurologic:  Alert and oriented x3;  grossly normal neurologically. Skin:  Intact without significant lesions or rashes. No jaundice. Lymph Nodes:  No significant cervical adenopathy. Psych:  Alert and cooperative. Normal mood and affect.  Imaging Studies: Dg Chest 2 View  Result Date: 08/24/2018 CLINICAL DATA:  Pain in chest, hx of 5 MI, hx of hypertension, COPD, on blood thinners, smoker EXAM: CHEST - 2 VIEW COMPARISON:  08/01/2014 FINDINGS: Cardiomediastinal silhouette is normal. The lungs are free of focal consolidations and pleural effusions. No pulmonary edema. Coronary stent grafts are partially image. There is lumbar fusion, partially evaluated. There is atherosclerotic calcification of the abdominal aorta. LEFT shoulder arthropathy. IMPRESSION: No evidence for acute cardiopulmonary abnormality. Aortic atherosclerosis. (ICD10-I70.0) the Electronically Signed   By: Nolon Nations M.D.   On: 08/24/2018 15:21    Assessment and Plan:   EMILIYA CHRETIEN is a 73 y.o. y/o female has been referred for dysphagia.   Plan  1. Continue Famotidine  2. EGD I have discussed  alternative options, risks &  benefits,  which include, but are not limited to, bleeding, infection, perforation,respiratory complication & drug reaction.  The patient agrees with this plan & written consent will be obtained.    Cardiac clearance for plavix holding instructions.   Follow up in 8 weeks   Dr Sarah Bellows MD,MRCP(U.K)

## 2018-09-10 ENCOUNTER — Telehealth: Payer: Self-pay | Admitting: Cardiovascular Disease

## 2018-09-10 NOTE — Telephone Encounter (Signed)
° °  [facility] Pre-operative Risk Assessment    Request for surgical clearance:  1. What type of surgery is being performed? Endoscopy   2. When is this surgery scheduled? TBD  3. What type of clearance is required (medical clearance vs. Pharmacy clearance to hold med vs. Both)? both  4. Are there any medications that need to be held prior to surgery and how long? Plavix 75 mg po q d please advise  5. Practice name and name of physician performing surgery?  GI  6. What is your office phone number [phone]    7.   What is your office fax number [phone]  8.   Anesthesia type (None, local, MAC, general) ? General    Clarisse Gouge 09/10/2018, 11:15 AM  _________________________________________________________________   (provider comments below)

## 2018-09-12 NOTE — Telephone Encounter (Signed)
Would hold plavix 5 days prior to endoscopy, stay on asa Restart plavix when procedure complete

## 2018-09-13 NOTE — Telephone Encounter (Signed)
   Primary Cardiologist: Ida Rogue, MD  Chart reviewed as part of pre-operative protocol coverage. Patient was contacted 09/13/2018 in reference to pre-operative risk assessment for pending surgery as outlined below.  Sarah Payne was last seen on 05/17/2018 by Dr. Rockey Situ.  Since that day, Sarah Payne has been stable from a cardiac perspective. She has no new cardiac complaints. Her main complaint is difficulty swallowing.   Therefore, based on ACC/AHA guidelines, the patient would be at acceptable risk for the planned procedure without further cardiovascular testing.   Per Dr. Rockey Situ, Would hold plavix 5 days prior to endoscopy, stay on asa Restart plavix when procedure complete.  I will route this recommendation to the requesting party via Epic fax function and remove from pre-op pool.  Please call with questions.  Daune Perch, NP 09/13/2018, 11:22 AM

## 2018-09-13 NOTE — Telephone Encounter (Signed)
Routing back to [place], [place].

## 2018-09-16 ENCOUNTER — Telehealth: Payer: Self-pay | Admitting: Gastroenterology

## 2018-09-16 NOTE — Telephone Encounter (Signed)
Pt is calling  She states we have received the ok to get off her rx Plavix and is ready to schedule EGD she states waiting for  this apt has giving her anxiety she is ready to have it done please call pt

## 2018-09-16 NOTE — Telephone Encounter (Signed)
Spoke with pt and informed her that we have received her cardiac clearance and holding instructions for Plavix. Dr. Rockey Situ instructs pt to hold the Plavix 5 das prior to the endoscopy procedure and to resume immediately after the procedure. Pt understands, EGD procedure has been scheduled.

## 2018-09-20 ENCOUNTER — Encounter: Payer: Self-pay | Admitting: Student

## 2018-09-21 ENCOUNTER — Ambulatory Visit: Payer: Medicare HMO | Admitting: Certified Registered"

## 2018-09-21 ENCOUNTER — Ambulatory Visit
Admission: RE | Admit: 2018-09-21 | Discharge: 2018-09-21 | Disposition: A | Discharge status: Home / Self Care | Payer: Medicare HMO | Attending: Gastroenterology | Admitting: Gastroenterology

## 2018-09-21 ENCOUNTER — Encounter
Admission: RE | Disposition: A | Discharge status: Home / Self Care | Payer: Self-pay | Source: Home / Self Care | Attending: Gastroenterology

## 2018-09-21 ENCOUNTER — Other Ambulatory Visit: Payer: Self-pay

## 2018-09-21 ENCOUNTER — Ambulatory Visit: Payer: Medicare HMO | Admitting: Primary Care

## 2018-09-21 DIAGNOSIS — K228 Other specified diseases of esophagus: Secondary | ICD-10-CM | POA: Diagnosis not present

## 2018-09-21 DIAGNOSIS — E782 Mixed hyperlipidemia: Secondary | ICD-10-CM | POA: Diagnosis not present

## 2018-09-21 DIAGNOSIS — K2970 Gastritis, unspecified, without bleeding: Secondary | ICD-10-CM | POA: Diagnosis not present

## 2018-09-21 DIAGNOSIS — Z79899 Other long term (current) drug therapy: Secondary | ICD-10-CM | POA: Insufficient documentation

## 2018-09-21 DIAGNOSIS — E119 Type 2 diabetes mellitus without complications: Secondary | ICD-10-CM | POA: Diagnosis not present

## 2018-09-21 DIAGNOSIS — F0150 Vascular dementia without behavioral disturbance: Secondary | ICD-10-CM | POA: Insufficient documentation

## 2018-09-21 DIAGNOSIS — F329 Major depressive disorder, single episode, unspecified: Secondary | ICD-10-CM | POA: Insufficient documentation

## 2018-09-21 DIAGNOSIS — I252 Old myocardial infarction: Secondary | ICD-10-CM | POA: Diagnosis not present

## 2018-09-21 DIAGNOSIS — I5022 Chronic systolic (congestive) heart failure: Secondary | ICD-10-CM | POA: Diagnosis not present

## 2018-09-21 DIAGNOSIS — Z79890 Hormone replacement therapy (postmenopausal): Secondary | ICD-10-CM | POA: Insufficient documentation

## 2018-09-21 DIAGNOSIS — J449 Chronic obstructive pulmonary disease, unspecified: Secondary | ICD-10-CM | POA: Insufficient documentation

## 2018-09-21 DIAGNOSIS — E785 Hyperlipidemia, unspecified: Secondary | ICD-10-CM | POA: Diagnosis not present

## 2018-09-21 DIAGNOSIS — K2960 Other gastritis without bleeding: Secondary | ICD-10-CM | POA: Insufficient documentation

## 2018-09-21 DIAGNOSIS — Z7982 Long term (current) use of aspirin: Secondary | ICD-10-CM | POA: Insufficient documentation

## 2018-09-21 DIAGNOSIS — Z791 Long term (current) use of non-steroidal anti-inflammatories (NSAID): Secondary | ICD-10-CM | POA: Insufficient documentation

## 2018-09-21 DIAGNOSIS — I110 Hypertensive heart disease with heart failure: Secondary | ICD-10-CM | POA: Diagnosis not present

## 2018-09-21 DIAGNOSIS — Z955 Presence of coronary angioplasty implant and graft: Secondary | ICD-10-CM | POA: Diagnosis not present

## 2018-09-21 DIAGNOSIS — G4730 Sleep apnea, unspecified: Secondary | ICD-10-CM | POA: Diagnosis not present

## 2018-09-21 DIAGNOSIS — E039 Hypothyroidism, unspecified: Secondary | ICD-10-CM | POA: Diagnosis not present

## 2018-09-21 DIAGNOSIS — K219 Gastro-esophageal reflux disease without esophagitis: Secondary | ICD-10-CM | POA: Insufficient documentation

## 2018-09-21 DIAGNOSIS — I509 Heart failure, unspecified: Secondary | ICD-10-CM | POA: Insufficient documentation

## 2018-09-21 DIAGNOSIS — Z794 Long term (current) use of insulin: Secondary | ICD-10-CM | POA: Insufficient documentation

## 2018-09-21 DIAGNOSIS — I2510 Atherosclerotic heart disease of native coronary artery without angina pectoris: Secondary | ICD-10-CM | POA: Diagnosis not present

## 2018-09-21 DIAGNOSIS — F17210 Nicotine dependence, cigarettes, uncomplicated: Secondary | ICD-10-CM | POA: Diagnosis not present

## 2018-09-21 DIAGNOSIS — R1310 Dysphagia, unspecified: Secondary | ICD-10-CM | POA: Diagnosis not present

## 2018-09-21 HISTORY — PX: ESOPHAGOGASTRODUODENOSCOPY (EGD) WITH PROPOFOL: SHX5813

## 2018-09-21 LAB — GLUCOSE, CAPILLARY: Glucose-Capillary: 212 mg/dL — ABNORMAL HIGH (ref 70–99)

## 2018-09-21 SURGERY — ESOPHAGOGASTRODUODENOSCOPY (EGD) WITH PROPOFOL
Anesthesia: General

## 2018-09-21 MED ORDER — SODIUM CHLORIDE 0.9 % IV SOLN
INTRAVENOUS | Status: DC
  Administered 2018-09-21: 09:00:00 via INTRAVENOUS

## 2018-09-21 MED ORDER — LIDOCAINE HCL (CARDIAC) PF 100 MG/5ML IV SOSY
PREFILLED_SYRINGE | INTRAVENOUS | Status: DC | PRN
  Administered 2018-09-21: 80 mg via INTRATRACHEAL

## 2018-09-21 MED ORDER — PROPOFOL 10 MG/ML IV BOLUS
INTRAVENOUS | Status: DC | PRN
  Administered 2018-09-21 (×3): 20 mg via INTRAVENOUS
  Administered 2018-09-21: 50 mg via INTRAVENOUS

## 2018-09-21 MED ORDER — GLYCOPYRROLATE 0.2 MG/ML IJ SOLN
INTRAMUSCULAR | Status: DC | PRN
  Administered 2018-09-21: 0.2 mg via INTRAVENOUS

## 2018-09-21 NOTE — Anesthesia Post-op Follow-up Note (Signed)
Anesthesia QCDR form completed.        

## 2018-09-21 NOTE — H&P (Signed)
Sarah Bellows, MD [address] [place], [place], [place], [id number], [place], [place], [address] Phone: [phone]  Fax: [phone]  Primary Care Physician:  [name], NP   Pre-Procedure History & Physical: HPI:  Sarah Payne is a 73 y.o. female is here for an endoscopy    Past Medical History:  Diagnosis Date  . Cataract    BILATERAL REMOVED  . Chronic low back pain   . COPD (chronic obstructive pulmonary disease) ([facility])   . Coronary artery disease    a. multiple PCIs to the mLCx. b. stent to dRCA 08/2011 in setting of NSTEMI. c. DES to prox-mid LCx for ISR 09/2011. d.  DESx2 to prox-mRCA 07/2012. e. Takotsubo event 12/2015 with patent stents.  . Depression   . Diabetes mellitus   . Diverticulosis   . Frequent PVCs    a. Noted in hospital 12/2015.  Marland Kitchen GERD (gastroesophageal reflux disease)   . Hyperlipidemia   . Hypertension   . Hypothyroidism   . Impingement syndrome of left shoulder   . Marijuana abuse   . 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 Sleep apnea    mild-does not use cpap  . Takotsubo cardiomyopathy    a. 12/2015 - nephew committed suicide 1 week prior, sister died the morning of presentation - initially called a STEMI; cath with patent stents. LVEF 25-30%.  . Tendonitis of left rotator cuff   . Tobacco abuse   . Vascular dementia     Past Surgical History:  Procedure Laterality Date  . ABDOMINAL HYSTERECTOMY    . APPENDECTOMY    . BACK SURGERY    . CARDIAC CATHETERIZATION  2013  . CARDIAC CATHETERIZATION  2016  . CARDIAC CATHETERIZATION N/A 01/01/2016   Procedure: Left Heart Cath and Coronary Angiography;  Surgeon: Jettie Booze, MD;  Location: [facility];  Service: Cardiovascular;  Laterality: N/A;  . CATARACT EXTRACTION W/PHACO Left 01/30/2015   Procedure: CATARACT EXTRACTION PHACO AND INTRAOCULAR LENS PLACEMENT (IOC);  Surgeon: Birder Robson, MD;  Location: [facility];  Service:  Ophthalmology;  Laterality: Left;  [place] 00:47   . CATARACT EXTRACTION W/PHACO Right 02/13/2015   Procedure: CATARACT EXTRACTION PHACO AND INTRAOCULAR LENS PLACEMENT (IOC);  Surgeon: Birder Robson, MD;  Location: [facility];  Service: Ophthalmology;  Laterality: Right;  cassette lot # [id number] H [place]  00:29.9 AP  20.7 CDE  6.20  . COLONOSCOPY N/A 11/02/2014   Procedure: COLONOSCOPY;  Surgeon: Inda Castle, MD;  Location: [facility];  Service: Endoscopy;  Laterality: N/A;  . CORONARY ANGIOPLASTY  2012   stent x 3   . CORONARY ANGIOPLASTY WITH STENT PLACEMENT  2013    Prior to Admission medications   Medication Sig Start Date End Date Taking? Authorizing Provider  amitriptyline (ELAVIL) 50 MG tablet Take 1 tablet (50 mg total) by mouth at bedtime. 08/23/18  Yes Vevelyn Francois, NP  aspirin 81 MG tablet Take 81 mg by mouth daily.     Yes [provider]  carvedilol (COREG) 6.25 MG tablet Take 1 tablet (6.25 mg total) by mouth 2 (two) times daily. 05/17/18  Yes Minna Merritts, MD  enalapril (VASOTEC) 20 MG tablet Take 1 tablet by mouth once daily for blood pressure. 05/17/18  Yes Minna Merritts, MD  ezetimibe (ZETIA) 10 MG tablet Take 1 tablet (10 mg total) by mouth daily. 05/17/18  Yes  Minna Merritts, MD  gabapentin (NEURONTIN) 100 MG capsule Take 1 capsule (100 mg total) by mouth every 8 (eight) hours. 08/23/18 11/21/18 Yes King, Diona Foley, NP  insulin aspart (NOVOLOG FLEXPEN) 100 UNIT/ML FlexPen Inject 20 Units into the skin 3 (three) times daily with meals. 09/07/18  Yes [name], NP  Insulin Glargine (LANTUS SOLOSTAR) 100 UNIT/ML Solostar Pen Inject 44 Units into the skin at bedtime. 08/25/18  Yes Philemon Kingdom, MD  isosorbide mononitrate (IMDUR) 30 MG 24 hr tablet Take 1 tablet (30 mg total) by mouth daily. 05/17/18  Yes Gollan, Kathlene November, MD  levothyroxine (SYNTHROID, LEVOTHROID) 125 MCG tablet TAKE 1 TABLET BY MOUTH ONCE DAILY 06/08/18  Yes Philemon Kingdom, MD    rosuvastatin (CRESTOR) 40 MG tablet Take 1 tablet (40 mg total) by mouth daily. 05/17/18 05/17/19 Yes Gollan, Kathlene November, MD  traZODone (DESYREL) 100 MG tablet Take 1 tablet (100 mg total) by mouth at bedtime. 11/24/17  Yes [name], NP  clopidogrel (PLAVIX) 75 MG tablet Take 1 tablet (75 mg total) by mouth daily. 05/17/18   Minna Merritts, MD  Continuous Blood Gluc Sensor (FREESTYLE LIBRE 14 DAY SENSOR) MISC 1 each by Does not apply route every 14 (fourteen) days. Change every 2 weeks 09/07/18   [name], NP  famotidine (PEPCID) 40 MG tablet Take 1 tablet (40 mg total) by mouth every evening. 08/24/18 08/24/19  Nance Pear, MD  gabapentin (NEURONTIN) 300 MG capsule Take 1 capsule (300 mg total) by mouth 3 (three) times daily. 01/22/18 05/17/18  Menshew, Dannielle Karvonen, PA-C  glucose blood (FREESTYLE LITE) test strip USE 1 STRIP TO CHECK BLOOD SUGAR 5 TIMES DAILY FOR UNCONTROLLED DIABETES MELLITUS 11/11/17   Philemon Kingdom, MD  glucose monitoring kit (FREESTYLE) monitoring kit 1 each by Does not apply route as needed for other. PHARMACY PLEASE SUBSTITUTE WHATEVER GLUCOMETER YOU CARRY 06/02/12   Crecencio Mc, MD  Insulin Pen Needle (INSUPEN PEN NEEDLES) 32G X 4 MM MISC Use to inject insulin 4 times daily. 01/08/17   Philemon Kingdom, MD  ketorolac (TORADOL) 10 MG tablet Take 1 tablet (10 mg total) by mouth every 8 (eight) hours. 01/22/18   Menshew, Dannielle Karvonen, PA-C  nystatin cream (MYCOSTATIN) Apply 1 application topically 2 (two) times daily. Apply a thin layer to clean and dry skin for up to 3 weeks. Patient not taking: Reported on 09/09/2018 02/10/18   Elby Beck, FNP  ondansetron (ZOFRAN-ODT) 8 MG disintegrating tablet Take 1 tablet (8 mg total) by mouth every 8 (eight) hours as needed for nausea. 12/28/17   Elby Beck, FNP  pantoprazole (PROTONIX) 40 MG tablet Take 1 tablet (40 mg total) by mouth daily. 05/17/18   Minna Merritts, MD  sucralfate (CARAFATE)  1 g tablet Take 1 tablet (1 g total) by mouth 4 (four) times daily. 08/24/18   Nance Pear, MD    Allergies as of 09/17/2018 - Review Complete 09/09/2018  Allergen Reaction Noted  . No known allergies  08/31/2013    Family History  Problem Relation Age of Onset  . Heart attack Mother        First MI @ 51 - Died @ 58  . Heart disease Mother   . Heart disease Father        Died @ 27  . Throat cancer Brother   . Liver cancer Brother   . Colon cancer Sister     Social History   Socioeconomic  History  . Marital status: Married    Spouse name: Not on file  . Number of children: Not on file  . Years of education: Not on file  . Highest education level: Not on file  Occupational History  . Not on file  Social Needs  . Financial resource strain: Not on file  . Food insecurity:    Worry: Not on file    Inability: Not on file  . Transportation needs:    Medical: Not on file    Non-medical: Not on file  Tobacco Use  . Smoking status: Current Every Day Smoker    Packs/day: 1.00    Years: 45.00    Pack years: 45.00    Types: Cigarettes  . Smokeless tobacco: Never Used  . Tobacco comment: Has cut back, trying to quit.   Substance and Sexual Activity  . Alcohol use: No    Alcohol/week: 0.0 standard drinks  . Drug use: Yes    Types: Marijuana    Comment: last noc  . Sexual activity: Not on file  Lifestyle  . Physical activity:    Days per week: Not on file    Minutes per session: Not on file  . Stress: Not on file  Relationships  . Social connections:    Talks on phone: Not on file    Gets together: Not on file    Attends religious service: Not on file    Active member of club or organization: Not on file    Attends meetings of clubs or organizations: Not on file    Relationship status: Not on file  . Intimate partner violence:    Fear of current or ex partner: Not on file    Emotionally abused: Not on file    Physically abused: Not on file    Forced sexual  activity: Not on file  Other Topics Concern  . Not on file  Social History Narrative   Lives at home with her husband in [place].  Previously used marijuana - quit.      Regular exercise: no/ pain from a frozen rotator cuff   Caffeine use: coffee daily and pepsi      Does not have a living will.   Daughters and husband know her wishes- would desire CPR but not prolonged life support if futile    Review of Systems: See HPI, otherwise negative ROS  Physical Exam: BP (!) 161/90   Pulse 73   Temp (!) 96.9 F (36.1 C) (Tympanic)   Resp (!) 22   Ht 5' 6.5" (1.689 m)   Wt 62.6 kg   SpO2 95%   BMI 21.94 kg/m  General:   Alert,  pleasant and cooperative in NAD Head:  Normocephalic and atraumatic. Neck:  Supple; no masses or thyromegaly. Lungs:  Clear throughout to auscultation, normal respiratory effort.    Heart:  +S1, +S2, Regular rate and rhythm, No edema. Abdomen:  Soft, nontender and nondistended. Normal bowel sounds, without guarding, and without rebound.   Neurologic:  Alert and  oriented x4;  grossly normal neurologically.  Impression/Plan: Sarah Payne is here for an endoscopy  to be performed for  evaluation of dysphagia and possible dilation     Risks, benefits, limitations, and alternatives regarding endoscopy and dilation  have been reviewed with the patient.  Questions have been answered.  All parties agreeable.   Sarah Bellows, MD  09/21/2018, 9:40 AM

## 2018-09-21 NOTE — Anesthesia Postprocedure Evaluation (Signed)
Anesthesia Post Note  Patient: ADEOLA DENNEN  Procedure(s) Performed: ESOPHAGOGASTRODUODENOSCOPY (EGD) WITH PROPOFOL (N/A )  Patient location during evaluation: Endoscopy Anesthesia Type: General Level of consciousness: awake and alert and oriented Pain management: pain level controlled Vital Signs Assessment: post-procedure vital signs reviewed and stable Respiratory status: spontaneous breathing, nonlabored ventilation and respiratory function stable Cardiovascular status: blood pressure returned to baseline and stable Postop Assessment: no signs of nausea or vomiting Anesthetic complications: no     Last Vitals:  Vitals:   09/21/18 1028 09/21/18 1029  BP: (!) 170/90 (!) 154/88  Pulse:    Resp:    Temp:    SpO2:      Last Pain:  Vitals:   09/21/18 1034  TempSrc:   PainSc: 0-No pain                 Kaila Devries

## 2018-09-21 NOTE — Transfer of Care (Signed)
Immediate Anesthesia Transfer of Care Note  Patient: Sarah Payne  Procedure(s) Performed: ESOPHAGOGASTRODUODENOSCOPY (EGD) WITH PROPOFOL (N/A )  Patient Location: Endoscopy Unit  Anesthesia Type:General  Level of Consciousness: awake, alert , oriented and patient cooperative  Airway & Oxygen Therapy: Patient Spontanous Breathing and Patient connected to face mask oxygen  Post-op Assessment: Report given to RN and Post -op Vital signs reviewed and stable  Post vital signs: Reviewed and stable  Last Vitals:  Vitals Value Taken Time  BP 135/98 09/21/2018 10:08 AM  Temp 36.1 C 09/21/2018 10:08 AM  Pulse 85 09/21/2018 10:09 AM  Resp 10 09/21/2018 10:09 AM  SpO2 100 % 09/21/2018 10:09 AM  Vitals shown include unvalidated device data.  Last Pain:  Vitals:   09/21/18 1008  TempSrc: Tympanic         Complications: No apparent anesthesia complications

## 2018-09-21 NOTE — Anesthesia Preprocedure Evaluation (Signed)
Anesthesia Evaluation  Patient identified by MRN, date of birth, ID band Patient awake    Reviewed: Allergy & Precautions, NPO status , Patient's Chart, lab work & pertinent test results  History of Anesthesia Complications Negative for: history of anesthetic complications  Airway Mallampati: II  TM Distance: >3 FB Neck ROM: Full    Dental  (+) Poor Dentition, Missing   Pulmonary sleep apnea , COPD,  COPD inhaler, Current Smoker,    breath sounds clear to auscultation- rhonchi (-) wheezing      Cardiovascular hypertension, Pt. on medications (-) angina+ CAD, + Past MI, + Cardiac Stents and +CHF   Rhythm:Regular Rate:Normal - Systolic murmurs and - Diastolic murmurs    Neuro/Psych neg Seizures PSYCHIATRIC DISORDERS Depression Dementia    GI/Hepatic Neg liver ROS, GERD  ,  Endo/Other  diabetes, Oral Hypoglycemic AgentsHypothyroidism   Renal/GU negative Renal ROS     Musculoskeletal  (+) Arthritis ,   Abdominal (+) - obese,   Peds  Hematology negative hematology ROS (+)   Anesthesia Other Findings Past Medical History: No date: Cataract     Comment:  BILATERAL REMOVED No date: Chronic low back pain No date: COPD (chronic obstructive pulmonary disease) ([facility]) No date: Coronary artery disease     Comment:  a. multiple PCIs to the mLCx. b. stent to dRCA 08/2011 in              setting of NSTEMI. c. DES to prox-mid LCx for ISR 09/2011.              d.  DESx2 to prox-mRCA 07/2012. e. Takotsubo event 12/2015               with patent stents. No date: Depression No date: Diabetes mellitus No date: Diverticulosis No date: Frequent PVCs     Comment:  a. Noted in hospital 12/2015. No date: GERD (gastroesophageal reflux disease) No date: Hyperlipidemia No date: Hypertension No date: Hypothyroidism No date: Impingement syndrome of left shoulder No date: Marijuana abuse No date: Myocardial infarction ([facility])     Comment:  x  5 No date: Sleep apnea     Comment:  mild-does not use cpap No date: Takotsubo cardiomyopathy     Comment:  a. 12/2015 - nephew committed suicide 1 week prior,               sister died the morning of presentation - initially               called a STEMI; cath with patent stents. LVEF 25-30%. No date: Tendonitis of left rotator cuff No date: Tobacco abuse No date: Vascular dementia   Reproductive/Obstetrics                             Anesthesia Physical Anesthesia Plan  ASA: III  Anesthesia Plan: General   Post-op Pain Management:    Induction: Intravenous  PONV Risk Score and Plan: 1 and Propofol infusion  Airway Management Planned: Natural Airway  Additional Equipment:   Intra-op Plan:   Post-operative Plan:   Informed Consent: I have reviewed the patients History and Physical, chart, labs and discussed the procedure including the risks, benefits and alternatives for the proposed anesthesia with the patient or authorized representative who has indicated his/her understanding and acceptance.     Dental advisory given  Plan Discussed with: CRNA and Anesthesiologist  Anesthesia Plan Comments:  Anesthesia Quick Evaluation  

## 2018-09-22 ENCOUNTER — Encounter: Payer: Self-pay | Admitting: Gastroenterology

## 2018-09-22 NOTE — Op Note (Signed)
[facility] Patient Name: Sarah Payne Procedure Date: 09/21/2018 9:51 AM MRN: [id number] Account #: [id number] Date of Birth: 19-Apr-1946 Admit Type: Outpatient Age: 73 Room: [facility] ENDO ROOM 1 Gender: Female Note Status: Finalized Procedure:            Upper GI endoscopy Indications:          Dysphagia Providers:            [name] MD, MD Referring MD:         [name] (Referring MD) Medicines:            Monitored Anesthesia Care Complications:        No immediate complications. Procedure:            Pre-Anesthesia Assessment:                       - ASA Grade Assessment: III - A patient with severe                        systemic disease.                       After obtaining informed consent, the endoscope was                        passed under direct vision. Throughout the procedure,                        the patient's blood pressure, pulse, and oxygen                        saturations were monitored continuously. The Endoscope                        was introduced through the mouth, and advanced to the                        third part of duodenum. The upper GI endoscopy was                        accomplished with ease. The patient tolerated the                        procedure well. Findings:      The examined duodenum was normal.      Localized moderate inflammation characterized by congestion (edema) and       erythema was found in the gastric body and in the gastric antrum.       Biopsies were taken with a cold forceps for histology.      Normal mucosa was found in the entire esophagus. Biopsies were taken       with a cold forceps for histology. A TTS dilator was passed through the       scope. Dilation with a 15-16.5-18 mm balloon dilator was performed to 18       mm. Impression:           - Normal examined duodenum.                       - Gastritis. Biopsied.                       -  Normal mucosa was found in the  entire esophagus.                        Biopsied. Dilated. Recommendation:       - Discharge patient to home (with escort).                       - Resume previous diet.                       - Resume Plavix (clopidogrel) at prior dose today.                       - Await pathology results.                       - Return to my office in 2 weeks.                       - Continue PPI Procedure Code(s):    --- Professional ---                       [phone], Esophagogastroduodenoscopy, flexible, transoral;                        with transendoscopic balloon dilation of esophagus                        (less than 30 mm diameter)                       43239, 59, Esophagogastroduodenoscopy, flexible,                        transoral; with biopsy, single or multiple Diagnosis Code(s):    --- Professional ---                       K29.70, Gastritis, unspecified, without bleeding                       R13.10, Dysphagia, unspecified CPT copyright 2018 American Medical Association. All rights reserved. The codes documented in this report are preliminary and upon coder review may  be revised to meet current compliance requirements. [name], MD [name] MD, MD 09/21/2018 10:05:36 AM This report has been signed electronically. Number of Addenda: 0 Note Initiated On: 09/21/2018 9:51 AM      [facility]

## 2018-09-23 LAB — SURGICAL PATHOLOGY

## 2018-09-24 ENCOUNTER — Encounter: Payer: Self-pay | Admitting: Primary Care

## 2018-09-24 ENCOUNTER — Ambulatory Visit (INDEPENDENT_AMBULATORY_CARE_PROVIDER_SITE_OTHER): Payer: Medicare HMO | Admitting: Primary Care

## 2018-09-24 DIAGNOSIS — E1151 Type 2 diabetes mellitus with diabetic peripheral angiopathy without gangrene: Secondary | ICD-10-CM | POA: Diagnosis not present

## 2018-09-24 MED ORDER — INSULIN GLARGINE 100 UNIT/ML SOLOSTAR PEN: 44.0000 [IU] | PEN_INJECTOR | Freq: Every day | SUBCUTANEOUS | 5 refills | Status: DC

## 2018-09-24 MED ORDER — GLUCOSE BLOOD VI STRP: ORAL_STRIP | 5 refills | Status: DC

## 2018-09-24 MED ORDER — INSULIN ASPART 100 UNIT/ML FLEXPEN: PEN_INJECTOR | SUBCUTANEOUS | 5 refills | Status: DC

## 2018-09-24 MED ORDER — INSULIN ASPART 100 UNIT/ML FLEXPEN: PEN_INJECTOR | SUBCUTANEOUS | 0 refills | Status: DC

## 2018-09-24 NOTE — Patient Instructions (Signed)
We've increased your Novolog to 24 units before supper. Continue 20 units before breakfast and lunch.   Continue Lantus 44 units in the evening.  Continue to reduce your regular soda consumption and sweet tea consumption.  Congratulations on those changes, I'm so proud!  Continue to monitor your glucose levels as discussed.  Please schedule a follow up appointment in 1 month for diabetes check. Call me sooner if you see readings consistently above 200 or below 70.  It was a pleasure to see you today!

## 2018-09-24 NOTE — Assessment & Plan Note (Signed)
Checking glucose readings 3-4 times daily which seem much better. Commended her on reducing soda consumption and encouraged to continue. Suspect her diet to largely be contributing to her uncontrolled diabetes.  She is running higher before dinner so we will increase Novolog to 24 units before dinner and continue 20 units before breakfast and lunch. Continue Lantus 44 units HS for now.  We will plan to see her back in 1 month with glucose logs. She will call sooner if she sees readings consistently above 200 or below 70.

## 2018-09-24 NOTE — Progress Notes (Signed)
Subjective:    Patient ID: Sarah Payne, female    DOB: April 07, 1946, 73 y.o.   MRN: [id number]  HPI  Sarah Payne is a 73 year old female who presents today for follow up of diabetes. She was last evaluated two weeks ago. Recently dismissed from endocrinology due to lack of compliance to her diet and prescribed regimen. During her last visit she endorsed that she wasn't checking her glucose levels so her medication was not adjusted, she was asked to return 2 weeks later with glucose logs.   Since her last visit she's limiting drinking regular sodas and is doing one Pepsi every other day. She was once drinking 6-7 Pepsi's daily.   Current medications include: Lantus 44 units HS, Novolog 20 units TID  She is checking her blood glucose 3-4 times daily and is getting readings of: AM fasting: 117, 142, 140, 93, 153 2 hours after breakfast: 122, 222, 69, 110, 152 Before lunch: 280, 168, 111 2 hours after lunch: 166, 218, 91, 224, 160 Before dinner: 164, 104 2 hours after dinner: 264, 166, 301, 139, 263, 229  Last A1C: 13.1 in early February 2020. A1C has not been below 10 in 2 years. Last Eye Exam: Due in April 2020 Last Foot Exam: Due Pneumonia Vaccination: Completed in October 2015 ACE/ARB: ACE Statin: Crestor  Diet currently consists of:  Breakfast: English muffin with egg/sausage/cheese Lunch: Salad, sandwich  Dinner: Meat, vegetables, potatoes/rice Snacks: None Desserts: None Beverages: Diet soda, regular soda every other day, water, semi-sweet tea  Exercise: She is not exercising  BP Readings from Last 3 Encounters:  09/24/18 120/78  09/21/18 (!) 154/88  09/09/18 106/65     Review of Systems  Respiratory: Negative for shortness of breath.   Cardiovascular: Negative for chest pain.  Neurological: Negative for dizziness, weakness and headaches.       Past Medical History:  Diagnosis Date  . Cataract    BILATERAL REMOVED  . Chronic low back pain   . COPD (chronic  obstructive pulmonary disease) ([place])   . Coronary artery disease    a. multiple PCIs to the mLCx. b. stent to dRCA 08/2011 in setting of NSTEMI. c. DES to prox-mid LCx for ISR 09/2011. d.  DESx2 to prox-mRCA 07/2012. e. Takotsubo event 12/2015 with patent stents.  . Depression   . Diabetes mellitus   . Diverticulosis   . Frequent PVCs    a. Noted in hospital 12/2015.  Marland Kitchen GERD (gastroesophageal reflux disease)   . Hyperlipidemia   . Hypertension   . Hypothyroidism   . Impingement syndrome of left shoulder   . Marijuana abuse   . Myocardial infarction ([place])    x 5  . Sleep apnea    mild-does not use cpap  . Takotsubo cardiomyopathy    a. 12/2015 - nephew committed suicide 1 week prior, sister died the morning of presentation - initially called a STEMI; cath with patent stents. LVEF 25-30%.  . Tendonitis of left rotator cuff   . Tobacco abuse   . Vascular dementia      Social History   Socioeconomic History  . Marital status: Married    Spouse name: Not on file  . Number of children: Not on file  . Years of education: Not on file  . Highest education level: Not on file  Occupational History  . Not on file  Social Needs  . Financial resource strain: Not on file  . Food insecurity:    Worry: Not  on file    Inability: Not on file  . Transportation needs:    Medical: Not on file    Non-medical: Not on file  Tobacco Use  . Smoking status: Current Every Day Smoker    Packs/day: 1.00    Years: 45.00    Pack years: 45.00    Types: Cigarettes  . Smokeless tobacco: Never Used  . Tobacco comment: Has cut back, trying to quit.   Substance and Sexual Activity  . Alcohol use: No    Alcohol/week: 0.0 standard drinks  . Drug use: Yes    Types: Marijuana    Comment: last noc  . Sexual activity: Not on file  Lifestyle  . Physical activity:    Days per week: Not on file    Minutes per session: Not on file  . Stress: Not on file  Relationships  . Social connections:    Talks on  phone: Not on file    Gets together: Not on file    Attends religious service: Not on file    Active member of club or organization: Not on file    Attends meetings of clubs or organizations: Not on file    Relationship status: Not on file  . Intimate partner violence:    Fear of current or ex partner: Not on file    Emotionally abused: Not on file    Physically abused: Not on file    Forced sexual activity: Not on file  Other Topics Concern  . Not on file  Social History Narrative   Lives at home with her husband in [place].  Previously used marijuana - quit.      Regular exercise: no/ pain from a frozen rotator cuff   Caffeine use: coffee daily and pepsi      Does not have a living will.   Daughters and husband know her wishes- would desire CPR but not prolonged life support if futile    Past Surgical History:  Procedure Laterality Date  . ABDOMINAL HYSTERECTOMY    . APPENDECTOMY    . BACK SURGERY    . CARDIAC CATHETERIZATION  2013  . CARDIAC CATHETERIZATION  2016  . CARDIAC CATHETERIZATION N/A 01/01/2016   Procedure: Left Heart Cath and Coronary Angiography;  Surgeon: Jettie Booze, MD;  Location: [facility];  Service: Cardiovascular;  Laterality: N/A;  . CATARACT EXTRACTION W/PHACO Left 01/30/2015   Procedure: CATARACT EXTRACTION PHACO AND INTRAOCULAR LENS PLACEMENT (IOC);  Surgeon: Birder Robson, MD;  Location: [facility];  Service: Ophthalmology;  Laterality: Left;  [place] 00:47   . CATARACT EXTRACTION W/PHACO Right 02/13/2015   Procedure: CATARACT EXTRACTION PHACO AND INTRAOCULAR LENS PLACEMENT (IOC);  Surgeon: Birder Robson, MD;  Location: [facility];  Service: Ophthalmology;  Laterality: Right;  cassette lot # [id number] H [place]  00:29.9 AP  20.7 CDE  6.20  . COLONOSCOPY N/A 11/02/2014   Procedure: COLONOSCOPY;  Surgeon: Inda Castle, MD;  Location: [place];  Service: Endoscopy;  Laterality: N/A;  . CORONARY ANGIOPLASTY  2012   stent x 3   . CORONARY  ANGIOPLASTY WITH STENT PLACEMENT  2013  . ESOPHAGOGASTRODUODENOSCOPY (EGD) WITH PROPOFOL N/A 09/21/2018   Procedure: ESOPHAGOGASTRODUODENOSCOPY (EGD) WITH PROPOFOL;  Surgeon: [name], MD;  Location: [facility];  Service: Gastroenterology;  Laterality: N/A;    Family History  Problem Relation Age of Onset  . Heart attack Mother        First MI @ 33 - Died @ 52  . Heart  disease Mother   . Heart disease Father        Died @ 55  . Throat cancer Brother   . Liver cancer Brother   . Colon cancer Sister     Allergies  Allergen Reactions  . No Known Allergies     Current Outpatient Medications on File Prior to Visit  Medication Sig Dispense Refill  . amitriptyline (ELAVIL) 50 MG tablet Take 1 tablet (50 mg total) by mouth at bedtime. 30 tablet 2  . aspirin 81 MG tablet Take 81 mg by mouth daily.      . carvedilol (COREG) 6.25 MG tablet Take 1 tablet (6.25 mg total) by mouth 2 (two) times daily. 180 tablet 3  . clopidogrel (PLAVIX) 75 MG tablet Take 1 tablet (75 mg total) by mouth daily. 90 tablet 3  . Continuous Blood Gluc Sensor (FREESTYLE LIBRE 14 DAY SENSOR) MISC 1 each by Does not apply route every 14 (fourteen) days. Change every 2 weeks 6 each 3  . enalapril (VASOTEC) 20 MG tablet Take 1 tablet by mouth once daily for blood pressure. 90 tablet 3  . ezetimibe (ZETIA) 10 MG tablet Take 1 tablet (10 mg total) by mouth daily. 90 tablet 3  . famotidine (PEPCID) 40 MG tablet Take 1 tablet (40 mg total) by mouth every evening. 30 tablet 1  . gabapentin (NEURONTIN) 100 MG capsule Take 1 capsule (100 mg total) by mouth every 8 (eight) hours. 90 capsule 2  . glucose monitoring kit (FREESTYLE) monitoring kit 1 each by Does not apply route as needed for other. PHARMACY PLEASE SUBSTITUTE WHATEVER GLUCOMETER YOU CARRY 1 each 1  . Insulin Pen Needle (INSUPEN PEN NEEDLES) 32G X 4 MM MISC Use to inject insulin 4 times daily. 250 each 1  . isosorbide mononitrate (IMDUR) 30 MG 24 hr tablet Take 1  tablet (30 mg total) by mouth daily. 90 tablet 3  . ketorolac (TORADOL) 10 MG tablet Take 1 tablet (10 mg total) by mouth every 8 (eight) hours. 15 tablet 0  . levothyroxine (SYNTHROID, LEVOTHROID) 125 MCG tablet TAKE 1 TABLET BY MOUTH ONCE DAILY 90 tablet 3  . nystatin cream (MYCOSTATIN) Apply 1 application topically 2 (two) times daily. Apply a thin layer to clean and dry skin for up to 3 weeks. 30 g 0  . ondansetron (ZOFRAN-ODT) 8 MG disintegrating tablet Take 1 tablet (8 mg total) by mouth every 8 (eight) hours as needed for nausea. 20 tablet 0  . pantoprazole (PROTONIX) 40 MG tablet Take 1 tablet (40 mg total) by mouth daily. 90 tablet 3  . rosuvastatin (CRESTOR) 40 MG tablet Take 1 tablet (40 mg total) by mouth daily. 90 tablet 3  . sucralfate (CARAFATE) 1 g tablet Take 1 tablet (1 g total) by mouth 4 (four) times daily. 60 tablet 0  . traZODone (DESYREL) 100 MG tablet Take 1 tablet (100 mg total) by mouth at bedtime. 90 tablet 1  . gabapentin (NEURONTIN) 300 MG capsule Take 1 capsule (300 mg total) by mouth 3 (three) times daily. 90 capsule 0   No current facility-administered medications on file prior to visit.     BP 120/78   Pulse 63   Temp 98.1 F (36.7 C) (Oral)   Ht _0  (1.676 m)   Wt 140 lb 4 oz (63.6 kg)   SpO2 99%   BMI 22.64 kg/m    Objective:   Physical Exam  Constitutional: She appears well-nourished.  Neck: Neck supple.  Cardiovascular:  Normal rate and regular rhythm.  Respiratory: Effort normal and breath sounds normal.  Skin: Skin is warm and dry.           Assessment & Plan:

## 2018-09-30 ENCOUNTER — Encounter: Payer: Self-pay | Admitting: Gastroenterology

## 2018-10-18 ENCOUNTER — Telehealth: Payer: Self-pay | Admitting: Primary Care

## 2018-10-18 NOTE — Telephone Encounter (Signed)
Those readings look much better than her prior readings. Have her bring in her log sheets and I will take a look.

## 2018-10-18 NOTE — Telephone Encounter (Signed)
Noted. Please have patient make sure she's checking her blood sugars as we've discussed during her prior visits. She is to notify me if her glucose is running at or above 200 consistently.  She is also to work very hard on her diet by liming Pepsi as discussed.

## 2018-10-18 NOTE — Telephone Encounter (Signed)
Spoken to patient to see if we can get to do a Web Ex or telephone call. Decline offer.  Patient wants to wait. I did reschedule her follow up to June as requested. She does not want to come to the office any time soon due to covid-19.

## 2018-10-18 NOTE — Telephone Encounter (Signed)
Spoken and notified patient of Sarah Payne comments. Patient is concern that her sugar is dropping. Patient stated that her highest blood sugar was las week 244 and lowest is 56.  Sunday morning 93 and at night 108 Monday morning 78 and at night 235  Patient stated that she can bring the blood sugar log sheet if you want her too. Patient stated that overall she feels okay but when it drops she have some shakes.

## 2018-10-19 NOTE — Telephone Encounter (Signed)
Spoken and notified patient of [name]'s comments. Patient verbalized understanding.  

## 2018-10-21 ENCOUNTER — Ambulatory Visit: Payer: Medicare HMO | Admitting: Gastroenterology

## 2018-10-25 ENCOUNTER — Ambulatory Visit: Payer: Medicare HMO | Admitting: Primary Care

## 2018-10-28 ENCOUNTER — Telehealth: Payer: Self-pay | Admitting: Primary Care

## 2018-10-28 DIAGNOSIS — E1151 Type 2 diabetes mellitus with diabetic peripheral angiopathy without gangrene: Secondary | ICD-10-CM

## 2018-10-28 NOTE — Telephone Encounter (Signed)
Message left for patient to return my call.  

## 2018-10-28 NOTE — Telephone Encounter (Signed)
Pt dropped off Sugar readings for Anda Kraft. Placed in Chan's hands.

## 2018-10-28 NOTE — Assessment & Plan Note (Addendum)
Patient dropped off glucose logs for March and April.  Glucose readings have improved on current regimen, but there are lower readings before and after meals. We will inquire regarding her dose and timing of injections. Consider reducing Novolog dose prior to meals.  AM fasting readings are well controlled, continue current dose of Lantus at 44 units.

## 2018-10-28 NOTE — Telephone Encounter (Signed)
Please thank her for these glucose readings. The numbers look much better!  Is she checking her sugars before injecting her Novolog meal time insulin? She has some lower readings before lunch so I need to know if she is checking her sugars before or after her lunch Novolog injection.  Also, how much is she injecting before each meal?

## 2018-11-01 NOTE — Telephone Encounter (Signed)
Spoken and notified patient of Sarah Payne comments. She check after injection. Patient stated that she inject the 2 hours after breakfast/lunch and 20 units.

## 2018-11-01 NOTE — Telephone Encounter (Signed)
Please notify patient that she needs to check her blood sugars before meals when injecting the Novolog insulin. This is a meal time insulin and can really lower her numbers too low if she has a low number to begin with.  Have her check sugars:  Before breakfast, lunch, dinner, bedtime for now. If she sees numbers below 100 before meals then she needs to reduce her Novolog down to 10 units before every meal. She can also check sugars 2 hours after any meal but doesn't have to at this point.

## 2018-11-02 NOTE — Telephone Encounter (Signed)
Spoken and notified patient of [name]'s comments. Patient verbalized understanding.  

## 2018-11-02 NOTE — Telephone Encounter (Signed)
Message left for patient to return my call.  

## 2018-11-15 ENCOUNTER — Ambulatory Visit: Payer: Medicare HMO | Attending: Nurse Practitioner | Admitting: Nurse Practitioner

## 2018-11-15 ENCOUNTER — Other Ambulatory Visit: Payer: Self-pay

## 2018-11-15 ENCOUNTER — Encounter: Payer: Self-pay | Admitting: Nurse Practitioner

## 2018-11-15 DIAGNOSIS — G8929 Other chronic pain: Secondary | ICD-10-CM | POA: Diagnosis not present

## 2018-11-15 DIAGNOSIS — M4716 Other spondylosis with myelopathy, lumbar region: Secondary | ICD-10-CM

## 2018-11-15 DIAGNOSIS — G629 Polyneuropathy, unspecified: Secondary | ICD-10-CM

## 2018-11-15 DIAGNOSIS — M5136 Other intervertebral disc degeneration, lumbar region: Secondary | ICD-10-CM

## 2018-11-15 DIAGNOSIS — M7918 Myalgia, other site: Secondary | ICD-10-CM

## 2018-11-15 DIAGNOSIS — G894 Chronic pain syndrome: Secondary | ICD-10-CM

## 2018-11-15 MED ORDER — AMITRIPTYLINE HCL 50 MG PO TABS: 50.0000 mg | ORAL_TABLET | Freq: Every day | ORAL | 2 refills | Status: DC

## 2018-11-15 MED ORDER — CYCLOBENZAPRINE HCL 5 MG PO TABS: 5.0000 mg | ORAL_TABLET | Freq: Three times a day (TID) | ORAL | 2 refills | Status: AC | PRN

## 2018-11-15 MED ORDER — GABAPENTIN 300 MG PO CAPS: 300.0000 mg | ORAL_CAPSULE | Freq: Three times a day (TID) | ORAL | 2 refills | Status: DC

## 2018-11-15 NOTE — Progress Notes (Signed)
Pain Management Encounter Note - Virtual Visit via Telephone Telehealth (real-time audio visits between healthcare provider and patient).  Patient's Phone No. & Preferred Pharmacy:  [phone] (home); There is no such number on file (mobile).; (Preferred) [phone]  [facility] [place], [place] [address] Phone: [phone] Fax: [phone]  [facility] - [address] [place] [address] Phone: [phone] Fax: [phone]  [facility] [address], [place] - [place] [facility] [place] [address] Phone: [phone] Fax: [phone]   Pre-screening note:  Our staff contacted Ms. Owens Shark and offered her an "in person", "face-to-face" appointment versus a telephone encounter. She indicated preferring the telephone encounter, at this time.  Reason for Virtual Visit: COVID-19*  Social distancing based on CDC and AMA recommendations.   I contacted [name] on 11/15/2018 at 9:47 AM by telephone and clearly identified myself as Dionisio David, NP. I verified that I was speaking with the correct person using two identifiers (Name and date of birth: Nov 25, 1945).  Advanced Informed Consent I sought verbal advanced consent from [name] for telemedicine interactions and virtual visit. I informed Ms. Owens Shark of the security and privacy concerns, risks, and limitations associated with performing an evaluation and management service by telephone. I also informed Ms. Turbyfill of the availability of "in person" appointments and I informed her of the possibility of a patient responsible charge related to this service. Ms. Borchardt expressed understanding and agreed to proceed.   Historic Elements   Ms. JAELYN BOURGOIN is a 73 y.o. year old, female patient evaluated today after her last encounter by our practice on 08/23/2018. Ms. Bartnick  has a past medical history  of Cataract, Chronic low back pain, COPD (chronic obstructive pulmonary disease) ([place]), Coronary artery disease, Depression, Diabetes mellitus, Diverticulosis, Frequent PVCs, GERD (gastroesophageal reflux disease), Hyperlipidemia, Hypertension, Hypothyroidism, Impingement syndrome of left shoulder, Marijuana abuse, Myocardial infarction [facility]), Sleep apnea, Takotsubo cardiomyopathy, Tendonitis of left rotator cuff, Tobacco abuse, and Vascular dementia. She also  has a past surgical history that includes Back surgery; Appendectomy; Abdominal hysterectomy; Colonoscopy (N/A, 11/02/2014); Cataract extraction w/PHACO (Left, 01/30/2015); Cardiac catheterization (2013); Coronary angioplasty (2012); Coronary angioplasty with stent (2013); Cardiac catheterization (2016); Cataract extraction w/PHACO (Right, 02/13/2015); Cardiac catheterization (N/A, 01/01/2016); and Esophagogastroduodenoscopy (egd) with propofol (N/A, 09/21/2018). Ms. Boehner has a current medication list which includes the following prescription(s): amitriptyline, aspirin, carvedilol, clopidogrel, freestyle libre 14 day sensor, cyclobenzaprine, enalapril, ezetimibe, famotidine, gabapentin, glucose blood, glucose monitoring kit, insulin aspart, insulin glargine, insulin pen needle, isosorbide mononitrate, ketorolac, levothyroxine, nystatin cream, ondansetron, pantoprazole, rosuvastatin, sucralfate, and trazodone. She  reports that she has been smoking cigarettes. She has a 45.00 pack-year smoking history. She has never used smokeless tobacco. She reports current drug use. Drug: Marijuana. She reports that she does not drink alcohol. Ms. Deines is allergic to no known allergies.   HPI  I last saw her on 08/23/2018. She is being evaluated for medication management. She is having 3/10 lower back pain. She denies any leg pain. She denies numbness, tingling or weakness. She has been using the Gabapentin BID.  She has been having the Gabapentin TID. She feels like the  tightness in her muscles is what is what is causing her to hurt more. She feels like to have her muscle relaxer started back to help with pain at night.   Pharmacotherapy Assessment  Analgesic: None  MME/day:0 mg/day.   Monitoring: Pharmacotherapy: No side-effects or adverse reactions reported. [place] PMP: PDMP reviewed during this encounter.       Compliance: No problems identified. Plan: Refer to "POC".  Review of recent tests  DG Chest 2 View CLINICAL DATA:  Pain in chest, hx of 5 MI, hx of hypertension, COPD, on blood thinners, smoker  EXAM: CHEST - 2 VIEW  COMPARISON:  08/01/2014  FINDINGS: Cardiomediastinal silhouette is normal. The lungs are free of focal consolidations and pleural effusions. No pulmonary edema. Coronary stent grafts are partially image. There is lumbar fusion, partially evaluated. There is atherosclerotic calcification of the abdominal aorta.  LEFT shoulder arthropathy.  IMPRESSION: No evidence for acute cardiopulmonary abnormality. Aortic atherosclerosis. (ICD10-I70.0) the  Electronically Signed   By: Nolon Nations M.D.   On: 08/24/2018 15:21   Admission on 09/21/2018, Discharged on 09/21/2018  Component Date Value Ref Range Status  . Glucose-Capillary 09/21/2018 212* 70 - 99 mg/dL Final  . SURGICAL PATHOLOGY 09/21/2018    Final                   Value:Surgical Pathology CASE: [id number] PATIENT: Alazia Siglin Surgical Pathology Report     SPECIMEN SUBMITTED: A. Esophagus, r/o EOE; cbxs B. Stomach, r/o gastritis; cbxs  CLINICAL HISTORY: None provided  PRE-OPERATIVE DIAGNOSIS: Dysphagia R13.10  POST-OPERATIVE DIAGNOSIS: None provided.     DIAGNOSIS: A.  ESOPHAGUS; COLD BIOPSY: - SQUAMOUS MUCOSA WITH FOCAL GLYCOGEN ACANTHOSIS, NON-SPECIFIC. - NEGATIVE FOR EOSINOPHILS, DYSPLASIA, AND MALIGNANCY.  B.  STOMACH; COLD BIOPSY: - REACTIVE GASTRITIS. - NEGATIVE FOR H. PYLORI, DYSPLASIA, AND MALIGNANCY.  Comment: On endoscopy,  glycogen acanthosis can appear as a white colored plaque and represents a mild increase in glycogen within the squamous cells. Glycogen acanthosis is a non-specific finding but can mimic some of the endoscopic findings of eosinophilic esophagitis.  GROSS DESCRIPTION: A. Labeled: Esophagus C BXs, rule out EOE Received: Formalin Tissue fragment(s): Multiple Size: Aggregate, 0.6 x                          0.2 x 0.1 cm Description: Pale-tan soft tissue fragments Entirely submitted in 1 cassette.  B. Labeled: Gastric C BXs, rule out gastritis Received: Formalin Tissue fragment(s): Multiple Size: Aggregate, 1.0 x 0.2 x 0.2 cm Description: Tan soft tissue fragments Entirely submitted in 1 cassette.   Final Diagnosis performed by Quay Burow, MD.   Electronically signed 09/23/2018 2:16:44PM The electronic signature indicates that the named Attending Pathologist has evaluated the specimen  Technical component performed at [facility], [address] [place], [place] [address] Lab: [phone] Dir: Rush Farmer, MD, MMM  Professional component performed at [facility], [facility], [place], [place], [place] [address] Lab: [phone] Dir: Dellia Nims. Rubinas, MD    Assessment  The primary encounter diagnosis was Lumbar spondylosis with myelopathy. Diagnoses of Peripheral polyneuropathy, Lumbar degenerative disc disease, Chronic pain syndrome, and Chronic musculoskeletal pain were also pertinent to this visit.  Plan of Care  I am having Megan Salon. Owens Shark maintain her aspirin, glucose monitoring kit, Insulin Pen Needle, traZODone, ondansetron, ketorolac, nystatin cream, clopidogrel, enalapril, ezetimibe, isosorbide mononitrate, pantoprazole, rosuvastatin, carvedilol, levothyroxine, sucralfate, famotidine, FreeStyle Libre 14 Day Sensor, glucose blood, Insulin Glargine, insulin aspart, amitriptyline, cyclobenzaprine, and gabapentin.  Pharmacotherapy (Medications Ordered): Meds  ordered this encounter  Medications  . amitriptyline (ELAVIL) 50 MG tablet    Sig: Take 1 tablet (50 mg total) by mouth at bedtime.  Dispense:  30 tablet    Refill:  2    Order Specific Question:   Supervising Provider    Answer:   Milinda Pointer [phone]  . cyclobenzaprine (FLEXERIL) 5 MG tablet    Sig: Take 1 tablet (5 mg total) by mouth 3 (three) times daily as needed for muscle spasms.    Dispense:  90 tablet    Refill:  2    Do not place this medication, or any other prescription from our practice, on "Automatic Refill". Patient may have prescription filled one day early if pharmacy is closed on scheduled refill date.    Order Specific Question:   Supervising Provider    Answer:   Milinda Pointer [phone]  . DISCONTD: gabapentin (NEURONTIN) 300 MG capsule    Sig: Take 1 capsule (300 mg total) by mouth 3 (three) times daily.    Dispense:  90 capsule    Refill:  2    Order Specific Question:   Supervising Provider    Answer:   Milinda Pointer [phone]  . gabapentin (NEURONTIN) 300 MG capsule    Sig: Take 1 capsule (300 mg total) by mouth 3 (three) times daily.    Dispense:  90 capsule    Refill:  2    Order Specific Question:   Supervising Provider    Answer:   Milinda Pointer [phone]   Orders:  No orders of the defined types were placed in this encounter.  Follow-up plan:   Return in about 3 months (around 02/14/2019).   I discussed the assessment and treatment plan with the patient. The patient was provided an opportunity to ask questions and all were answered. The patient agreed with the plan and demonstrated an understanding of the instructions.  Patient advised to call back or seek an in-person evaluation if the symptoms or condition worsens.  Total duration of non-face-to-face encounter: 15 minutes.  Note by: Dionisio David, NP Date: 11/15/2018; Time: 10:04 AM  Disclaimer:  * Given the special circumstances of the COVID-19 pandemic, the federal  government has announced that the Office for Civil Rights (OCR) will exercise its enforcement discretion and will not impose penalties on physicians using telehealth in the event of noncompliance with regulatory requirements under the [place] (HIPAA) in connection with the good faith provision of telehealth during the IFBPP-94 national public health emergency. ([place])

## 2018-11-15 NOTE — Patient Instructions (Signed)
______________________________________________________________________________________________  Specialty Pain Scale  Introduction:  There are significant differences in how pain is reported. The word pain usually refers to physical pain, but it is also a common synonym of suffering. The medical community uses a scale from 0 (zero) to 10 (ten) to report pain level. Zero (0) is described as "no pain", while ten (10) is described as "the worse pain you can imagine". The problem with this scale is that physical pain is reported along with suffering. Suffering refers to mental pain, or more often yet it refers to any unpleasant feeling, emotion or aversion associated with the perception of harm or threat of harm. It is the psychological component of pain.  Pain Specialists prefer to separate the two components. The pain scale used by this practice is the Verbal Numerical Rating Scale (VNRS-11). This scale is for the physical pain only. DO NOT INCLUDE how your pain psychologically affects you. This scale is for adults 21 years of age and older. It has 11 (eleven) levels. The 1st level is 0/10. This means: "right now, I have no pain". In the context of pain management, it also means: "right now, my physical pain is under control with the current therapy".  General Information:  The scale should reflect your current level of pain. Unless you are specifically asked for the level of your worst pain, or your average pain. If you are asked for one of these two, then it should be understood that it is over the past 24 hours.  Levels 1 (one) through 5 (five) are described below, and can be treated as an outpatient. Ambulatory pain management facilities such as ours are more than adequate to treat these levels. Levels 6 (six) through 10 (ten) are also described below, however, these must be treated as a hospitalized patient. While levels 6 (six) and 7 (seven) may be evaluated at an urgent care facility, levels 8  (eight) through 10 (ten) constitute medical emergencies and as such, they belong in a hospital's emergency department. When having these levels (as described below), do not come to our office. Our facility is not equipped to manage these levels. Go directly to an urgent care facility or an emergency department to be evaluated.  Definitions:  Activities of Daily Living (ADL): Activities of daily living (ADL or ADLs) is a term used in healthcare to refer to people's daily self-care activities. Health professionals often use a person's ability or inability to perform ADLs as a measurement of their functional status, particularly in regard to people post injury, with disabilities and the elderly. There are two ADL levels: Basic and Instrumental. Basic Activities of Daily Living (BADL  or BADLs) consist of self-care tasks that include: Bathing and showering; personal hygiene and grooming (including brushing/combing/styling hair); dressing; Toilet hygiene (getting to the toilet, cleaning oneself, and getting back up); eating and self-feeding (not including cooking or chewing and swallowing); functional mobility, often referred to as "transferring", as measured by the ability to walk, get in and out of bed, and get into and out of a chair; the broader definition (moving from one place to another while performing activities) is useful for people with different physical abilities who are still able to get around independently. Basic ADLs include the things many people do when they get up in the morning and get ready to go out of the house: get out of bed, go to the toilet, bathe, dress, groom, and eat. On the average, loss of function typically follows a particular order.   Hygiene is the first to go, followed by loss of toilet use and locomotion. The last to go is the ability to eat. When there is only one remaining area in which the person is independent, there is a 62.9% chance that it is eating and only a 3.5% chance  that it is hygiene. Instrumental Activities of Daily Living (IADL or IADLs) are not necessary for fundamental functioning, but they let an individual live independently in a community. IADL consist of tasks that include: cleaning and maintaining the house; home establishment and maintenance; care of others (including selecting and supervising caregivers); care of pets; child rearing; managing money; managing financials (investments, etc.); meal preparation and cleanup; shopping for groceries and necessities; moving within the community; safety procedures and emergency responses; health management and maintenance (taking prescribed medications); and using the telephone or other form of communication.  Instructions:  Most patients tend to report their pain as a combination of two factors, their physical pain and their psychosocial pain. This last one is also known as "suffering" and it is reflection of how physical pain affects you socially and psychologically. From now on, report them separately.  From this point on, when asked to report your pain level, report only your physical pain. Use the following table for reference.  Pain Clinic Pain Levels (0-5/10)  Pain Level Score  Description  No Pain 0   Mild pain 1 Nagging, annoying, but does not interfere with basic activities of daily living (ADL). Patients are able to eat, bathe, get dressed, toileting (being able to get on and off the toilet and perform personal hygiene functions), transfer (move in and out of bed or a chair without assistance), and maintain continence (able to control bladder and bowel functions). Blood pressure and heart rate are unaffected. A normal heart rate for a healthy adult ranges from 60 to 100 bpm (beats per minute).   Mild to moderate pain 2 Noticeable and distracting. Impossible to hide from other people. More frequent flare-ups. Still possible to adapt and function close to normal. It can be very annoying and may have  occasional stronger flare-ups. With discipline, patients may get used to it and adapt.   Moderate pain 3 Interferes significantly with activities of daily living (ADL). It becomes difficult to feed, bathe, get dressed, get on and off the toilet or to perform personal hygiene functions. Difficult to get in and out of bed or a chair without assistance. Very distracting. With effort, it can be ignored when deeply involved in activities.   Moderately severe pain 4 Impossible to ignore for more than a few minutes. With effort, patients may still be able to manage work or participate in some social activities. Very difficult to concentrate. Signs of autonomic nervous system discharge are evident: dilated pupils (mydriasis); mild sweating (diaphoresis); sleep interference. Heart rate becomes elevated (>115 bpm). Diastolic blood pressure (lower number) rises above 100 mmHg. Patients find relief in laying down and not moving.   Severe pain 5 Intense and extremely unpleasant. Associated with frowning face and frequent crying. Pain overwhelms the senses.  Ability to do any activity or maintain social relationships becomes significantly limited. Conversation becomes difficult. Pacing back and forth is common, as getting into a comfortable position is nearly impossible. Pain wakes you up from deep sleep. Physical signs will be obvious: pupillary dilation; increased sweating; goosebumps; brisk reflexes; cold, clammy hands and feet; nausea, vomiting or dry heaves; loss of appetite; significant sleep disturbance with inability to fall asleep or to   remain asleep. When persistent, significant weight loss is observed due to the complete loss of appetite and sleep deprivation.  Blood pressure and heart rate becomes significantly elevated. Caution: If elevated blood pressure triggers a pounding headache associated with blurred vision, then the patient should immediately seek attention at an urgent or emergency care unit, as  these may be signs of an impending stroke.    Emergency Department Pain Levels (6-10/10)  Emergency Room Pain 6 Severely limiting. Requires emergency care and should not be seen or managed at an outpatient pain management facility. Communication becomes difficult and requires great effort. Assistance to reach the emergency department may be required. Facial flushing and profuse sweating along with potentially dangerous increases in heart rate and blood pressure will be evident.   Distressing pain 7 Self-care is very difficult. Assistance is required to transport, or use restroom. Assistance to reach the emergency department will be required. Tasks requiring coordination, such as bathing and getting dressed become very difficult.   Disabling pain 8 Self-care is no longer possible. At this level, pain is disabling. The individual is unable to do even the most "basic" activities such as walking, eating, bathing, dressing, transferring to a bed, or toileting. Fine motor skills are lost. It is difficult to think clearly.   Incapacitating pain 9 Pain becomes incapacitating. Thought processing is no longer possible. Difficult to remember your own name. Control of movement and coordination are lost.   The worst pain imaginable 10 At this level, most patients pass out from pain. When this level is reached, collapse of the autonomic nervous system occurs, leading to a sudden drop in blood pressure and heart rate. This in turn results in a temporary and dramatic drop in blood flow to the brain, leading to a loss of consciousness. Fainting is one of the body's self defense mechanisms. Passing out puts the brain in a calmed state and causes it to shut down for a while, in order to begin the healing process.    Summary: 1.   Refer to this scale when providing [place] with your pain level. 2.   Be accurate and careful when reporting your pain level. This will help with your care. 3.   Over-reporting your pain level  will lead to loss of credibility. 4.   Even a level of 1/10 means that there is pain and will be treated at our facility. 5.   High, inaccurate reporting will be documented as "Symptom Exaggeration", leading to loss of credibility and suspicions of possible secondary gains such as obtaining more narcotics, or wanting to appear disabled, for fraudulent reasons. 6.   Only pain levels of 5 or below will be seen at our facility. 7.   Pain levels of 6 and above will be sent to the Emergency Department and the appointment cancelled.  ______________________________________________________________________________________________    ____________________________________________________________________________________________  Appointment Policy Summary  It is our goal and responsibility to provide the medical community with assistance in the evaluation and management of patients with chronic pain. Unfortunately our resources are limited. Because we do not have an unlimited amount of time, or available appointments, we are required to closely monitor and manage their use. The following rules exist to maximize their use:  Patient's responsibilities: 1. Punctuality:  At what time should I arrive? You should be physically present in our office 30 minutes before your scheduled appointment. Your scheduled appointment is with your assigned healthcare provider. However, it takes 5-10 minutes to be "checked-in", and another 15 minutes  for the nurses to do the admission. If you arrive to our office at the time you were given for your appointment, you will end up being at least 20-25 minutes late to your appointment with the provider. 2. Tardiness:  What happens if I arrive only a few minutes after my scheduled appointment time? You will need to reschedule your appointment. The cutoff is your appointment time. This is why it is so important that you arrive at least 30 minutes before that appointment. If you have an  appointment scheduled for 10:00 AM and you arrive at 10:01, you will be required to reschedule your appointment.  3. Plan ahead:  Always assume that you will encounter traffic on your way in. Plan for it. If you are dependent on a driver, make sure they understand these rules and the need to arrive early. 4. Other appointments and responsibilities:  Avoid scheduling any other appointments before or after your pain clinic appointments.  5. Be prepared:  Write down everything that you need to discuss with your healthcare provider and give this information to the admitting nurse. Write down the medications that you will need refilled. Bring your pills and bottles (even the empty ones), to all of your appointments, except for those where a procedure is scheduled. 6. No children or pets:  Find someone to take care of them. It is not appropriate to bring them in. 7. Scheduling changes:  We request "advanced notification" of any changes or cancellations. 8. Advanced notification:  Defined as a time period of more than 24 hours prior to the originally scheduled appointment. This allows for the appointment to be offered to other patients. 9. Rescheduling:  When a visit is rescheduled, it will require the cancellation of the original appointment. For this reason they both fall within the category of "Cancellations".  10. Cancellations:  They require advanced notification. Any cancellation less than 24 hours before the  appointment will be recorded as a "No Show". 11. No Show:  Defined as an unkept appointment where the patient failed to notify or declare to the practice their intention or inability to keep the appointment.  Corrective process for repeat offenders:  1. Tardiness: Three (3) episodes of rescheduling due to late arrivals will be recorded as one (1) "No Show". 2. Cancellation or reschedule: Three (3) cancellations or rescheduling will be recorded as one (1) "No Show". 3. "No Shows": Three  (3) "No Shows" within a 12 month period will result in discharge from the practice. ____________________________________________________________________________________________

## 2018-11-24 ENCOUNTER — Ambulatory Visit: Payer: Medicare HMO

## 2018-11-25 ENCOUNTER — Other Ambulatory Visit (INDEPENDENT_AMBULATORY_CARE_PROVIDER_SITE_OTHER): Payer: Medicare HMO

## 2018-11-25 ENCOUNTER — Other Ambulatory Visit: Payer: Self-pay

## 2018-11-25 DIAGNOSIS — E039 Hypothyroidism, unspecified: Secondary | ICD-10-CM

## 2018-11-25 LAB — TSH: TSH: 0.09 u[IU]/mL — ABNORMAL LOW (ref 0.35–4.50)

## 2018-11-26 ENCOUNTER — Ambulatory Visit (INDEPENDENT_AMBULATORY_CARE_PROVIDER_SITE_OTHER): Payer: Medicare HMO | Admitting: Primary Care

## 2018-11-26 ENCOUNTER — Encounter: Payer: Self-pay | Admitting: Primary Care

## 2018-11-26 DIAGNOSIS — E1151 Type 2 diabetes mellitus with diabetic peripheral angiopathy without gangrene: Secondary | ICD-10-CM | POA: Diagnosis not present

## 2018-11-26 DIAGNOSIS — M549 Dorsalgia, unspecified: Secondary | ICD-10-CM

## 2018-11-26 DIAGNOSIS — G8929 Other chronic pain: Secondary | ICD-10-CM

## 2018-11-26 DIAGNOSIS — G4700 Insomnia, unspecified: Secondary | ICD-10-CM

## 2018-11-26 DIAGNOSIS — E039 Hypothyroidism, unspecified: Secondary | ICD-10-CM

## 2018-11-26 DIAGNOSIS — I2510 Atherosclerotic heart disease of native coronary artery without angina pectoris: Secondary | ICD-10-CM

## 2018-11-26 DIAGNOSIS — E782 Mixed hyperlipidemia: Secondary | ICD-10-CM

## 2018-11-26 DIAGNOSIS — I25118 Atherosclerotic heart disease of native coronary artery with other forms of angina pectoris: Secondary | ICD-10-CM | POA: Diagnosis not present

## 2018-11-26 DIAGNOSIS — F32A Depression, unspecified: Secondary | ICD-10-CM

## 2018-11-26 DIAGNOSIS — K219 Gastro-esophageal reflux disease without esophagitis: Secondary | ICD-10-CM | POA: Diagnosis not present

## 2018-11-26 DIAGNOSIS — I5022 Chronic systolic (congestive) heart failure: Secondary | ICD-10-CM | POA: Diagnosis not present

## 2018-11-26 DIAGNOSIS — J449 Chronic obstructive pulmonary disease, unspecified: Secondary | ICD-10-CM

## 2018-11-26 DIAGNOSIS — I10 Essential (primary) hypertension: Secondary | ICD-10-CM

## 2018-11-26 DIAGNOSIS — I5181 Takotsubo syndrome: Secondary | ICD-10-CM

## 2018-11-26 DIAGNOSIS — F4323 Adjustment disorder with mixed anxiety and depressed mood: Secondary | ICD-10-CM

## 2018-11-26 DIAGNOSIS — G629 Polyneuropathy, unspecified: Secondary | ICD-10-CM

## 2018-11-26 DIAGNOSIS — F329 Major depressive disorder, single episode, unspecified: Secondary | ICD-10-CM

## 2018-11-26 DIAGNOSIS — M542 Cervicalgia: Secondary | ICD-10-CM

## 2018-11-26 DIAGNOSIS — G894 Chronic pain syndrome: Secondary | ICD-10-CM

## 2018-11-26 MED ORDER — LEVOTHYROXINE SODIUM 112 MCG PO TABS: ORAL_TABLET | ORAL | 1 refills | Status: DC

## 2018-11-26 NOTE — Assessment & Plan Note (Signed)
Compliant to clopidogrel, statin, and aspirin. Following with cardiology. Asymptomatic.

## 2018-11-26 NOTE — Assessment & Plan Note (Signed)
BP Readings from Last 3 Encounters:  09/24/18 120/78  09/21/18 (!) 154/88  09/09/18 106/65   Prior stable readings. Continue current regimen.

## 2018-11-26 NOTE — Assessment & Plan Note (Signed)
Following with pain management, doing well on prescribed regimen.

## 2018-11-26 NOTE — Assessment & Plan Note (Signed)
Following with cardiology. 

## 2018-11-26 NOTE — Assessment & Plan Note (Signed)
Denies concerns for depression and anxiety.  Using Trazodone for sleep infrequently. Continue to monitor.

## 2018-11-26 NOTE — Assessment & Plan Note (Signed)
Compliant to statin therapy, repeat lipids pending. °

## 2018-11-26 NOTE — Assessment & Plan Note (Signed)
Glucose under much better control and at times too low. Discussed that she needs to check glucose readings prior to injecting Novolog with meals rather than just 2 hours after.  Reduce Novolog to 10 untis TID with meals. Continue Lantus 44 units HS. She will drop of log sheets.  Follow up in month.

## 2018-11-26 NOTE — Assessment & Plan Note (Signed)
Following with pain management, doing well on prescribed regimen. Continue same.

## 2018-11-26 NOTE — Assessment & Plan Note (Signed)
Asymptomatic. Continue ACE and beta blocker.

## 2018-11-26 NOTE — Assessment & Plan Note (Signed)
Following with GI. Endorses not taking PPI, H2 blocker, or Carafate.

## 2018-11-26 NOTE — Patient Instructions (Signed)
We've reduced the dose of your levothyroxine to 112 mcg. I sent this to [facility] in [place] as requested.  Be sure to take your levothyroxine (thyroid medication) every morning on an empty stomach with water only. No food or other medications for 30 minutes. No heartburn medication, iron pills, calcium, vitamin D, or magnesium pills within four hours of taking levothyroxine.   Check your blood sugars before each meal before you inject Novolog. Do not inject if your blood sugar is below 80.  We've reduced the dose of your Novolog to 10 units before meals to prevent you from dropping too low after.  Continue Lantus 44 units daily.  We will see you next month as scheduled.  It was a pleasure to see you today!

## 2018-11-26 NOTE — Progress Notes (Signed)
Subjective:    Patient ID: Sarah Payne, female    DOB: 06/14/1946, 73 y.o.   MRN: [id number]  HPI     Sarah Payne - 73 y.o. female  MRN [id number]  Date of Birth: May 20, 1946  PCP: [name], NP  This service was provided via telemedicine. Phone Visit performed on 11/26/2018    Rationale for phone visit along with limitations reviewed. Patient consented to telephone encounter.    Location of patient: Home Location of provider: Office at [employer] @ [facility] Name of referring provider: N/A   Names of persons and role in encounter: Provider: [name], NP  Patient: Sarah Payne  Other: N/A   Time on call: 12 min and 02 seconds   Subjective: Chief Complaint  Patient presents with  . Annual Exam     HPI:  Sarah Payne is a 73 year old female who presents today for follow up of medical problems.  1) Type 2 Diabetes:  Current medications include: Lantus 44 units HS, Novolog 20 units TID.  She is checking her blood glucose 3-4 times daily and is getting readings of:  AM fasting: 170 2 hours after breakfast: 46, 50, 70 2 hours after lunch: 130-150's Before dinner 65-91 Bedtime: 130-150's  Last A1C: 13.1 in February 2020 Last Eye Exam: No recent  Last Foot Exam: Completed in March 2020 Pneumonia Vaccination: Completed in 2015 ACE/ARB: ACE Statin: Crestor  2) Chronic Pain: Currently following with pain management and is managed on cyclobenzaprine 5 mg, gabapentin 300 mg TID, amitriptyline 50 mg ketorolac 10 mg. Overall doing well.   3) Essential Hypertension/Hyperlipidemia/CHF/CAD: Currently managed on carvedilol 6.25 mg BID, enalapril 20 mg, isosorbide mononitrate 30 mg, clopidogrel 75 mg, rosuvastatin 40 mg, Zetia 10 mg. She is following with cardiology with her last visit being in October 2019. She denies chest pain, dizziness, shortness of breath.   4) Hypothyroidism: Currently managed on levothyroxine 125 mcg. Her recent TSH was 0.09  compared to 2.26 10 months ago. She takes her levothyroxine every morning with coffee on an empty stomach, no food or other medications for 30 minutes. Recent TSH of 0.09.   Objective/Observations:   No physical exam or vital signs collected unless specifically identified below.   There were no vitals taken for this visit.   Respiratory status: speaks in complete sentences without evident shortness of breath.   Assessment/Plan:  See problem based charting.  No problem-specific Assessment & Plan notes found for this encounter.   I discussed the assessment and treatment plan with the patient. The patient was provided an opportunity to ask questions and all were answered. The patient agreed with the plan and demonstrated an understanding of the instructions.  Lab Orders  No laboratory test(s) ordered today    No orders of the defined types were placed in this encounter.   The patient was advised to call back or seek an in-person evaluation if the symptoms worsen or if the condition fails to improve as anticipated.  [name], NP    Review of Systems  Eyes: Negative for visual disturbance.  Respiratory: Negative for shortness of breath.   Cardiovascular: Negative for chest pain.  Neurological: Negative for dizziness and headaches.       Past Medical History:  Diagnosis Date  . Cataract    BILATERAL REMOVED  . Chronic low back pain   . COPD (chronic obstructive pulmonary disease) ([place])   . Coronary artery disease  a. multiple PCIs to the mLCx. b. stent to dRCA 08/2011 in setting of NSTEMI. c. DES to prox-mid LCx for ISR 09/2011. d.  DESx2 to prox-mRCA 07/2012. e. Takotsubo event 12/2015 with patent stents.  . Depression   . Diabetes mellitus   . Diverticulosis   . Frequent PVCs    a. Noted in hospital 12/2015.  Marland Kitchen GERD (gastroesophageal reflux disease)   . Hyperlipidemia   . Hypertension   . Hypothyroidism   . Impingement syndrome of left shoulder   . Marijuana  abuse   . Myocardial infarction ([place])    x 5  . Sleep apnea    mild-does not use cpap  . Takotsubo cardiomyopathy    a. 12/2015 - nephew committed suicide 1 week prior, sister died the morning of presentation - initially called a STEMI; cath with patent stents. LVEF 25-30%.  . Tendonitis of left rotator cuff   . Tobacco abuse   . Vascular dementia      Social History   Socioeconomic History  . Marital status: Married    Spouse name: Not on file  . Number of children: Not on file  . Years of education: Not on file  . Highest education level: Not on file  Occupational History  . Not on file  Social Needs  . Financial resource strain: Not on file  . Food insecurity:    Worry: Not on file    Inability: Not on file  . Transportation needs:    Medical: Not on file    Non-medical: Not on file  Tobacco Use  . Smoking status: Current Every Day Smoker    Packs/day: 1.00    Years: 45.00    Pack years: 45.00    Types: Cigarettes  . Smokeless tobacco: Never Used  . Tobacco comment: Has cut back, trying to quit.   Substance and Sexual Activity  . Alcohol use: No    Alcohol/week: 0.0 standard drinks  . Drug use: Yes    Types: Marijuana    Comment: last noc  . Sexual activity: Not on file  Lifestyle  . Physical activity:    Days per week: Not on file    Minutes per session: Not on file  . Stress: Not on file  Relationships  . Social connections:    Talks on phone: Not on file    Gets together: Not on file    Attends religious service: Not on file    Active member of club or organization: Not on file    Attends meetings of clubs or organizations: Not on file    Relationship status: Not on file  . Intimate partner violence:    Fear of current or ex partner: Not on file    Emotionally abused: Not on file    Physically abused: Not on file    Forced sexual activity: Not on file  Other Topics Concern  . Not on file  Social History Narrative   Lives at home with her husband  in [place].  Previously used marijuana - quit.      Regular exercise: no/ pain from a frozen rotator cuff   Caffeine use: coffee daily and pepsi      Does not have a living will.   Daughters and husband know her wishes- would desire CPR but not prolonged life support if futile    Past Surgical History:  Procedure Laterality Date  . ABDOMINAL HYSTERECTOMY    . APPENDECTOMY    . BACK SURGERY    .  CARDIAC CATHETERIZATION  2013  . CARDIAC CATHETERIZATION  2016  . CARDIAC CATHETERIZATION N/A 01/01/2016   Procedure: Left Heart Cath and Coronary Angiography;  Surgeon: Jettie Booze, MD;  Location: [facility];  Service: Cardiovascular;  Laterality: N/A;  . CATARACT EXTRACTION W/PHACO Left 01/30/2015   Procedure: CATARACT EXTRACTION PHACO AND INTRAOCULAR LENS PLACEMENT (IOC);  Surgeon: Birder Robson, MD;  Location: [facility];  Service: Ophthalmology;  Laterality: Left;  [place] 00:47   . CATARACT EXTRACTION W/PHACO Right 02/13/2015   Procedure: CATARACT EXTRACTION PHACO AND INTRAOCULAR LENS PLACEMENT (IOC);  Surgeon: Birder Robson, MD;  Location: [facility];  Service: Ophthalmology;  Laterality: Right;  cassette lot # [id number] H [place]  00:29.9 AP  20.7 CDE  6.20  . COLONOSCOPY N/A 11/02/2014   Procedure: COLONOSCOPY;  Surgeon: Inda Castle, MD;  Location: [facility];  Service: Endoscopy;  Laterality: N/A;  . CORONARY ANGIOPLASTY  2012   stent x 3   . CORONARY ANGIOPLASTY WITH STENT PLACEMENT  2013  . ESOPHAGOGASTRODUODENOSCOPY (EGD) WITH PROPOFOL N/A 09/21/2018   Procedure: ESOPHAGOGASTRODUODENOSCOPY (EGD) WITH PROPOFOL;  Surgeon: [name], MD;  Location: [facility];  Service: Gastroenterology;  Laterality: N/A;    Family History  Problem Relation Age of Onset  . Heart attack Mother        First MI @ 26 - Died @ 59  . Heart disease Mother   . Heart disease Father        Died @ 35  . Throat cancer Brother   . Liver cancer Brother   . Colon cancer Sister     Allergies   Allergen Reactions  . No Known Allergies     Current Outpatient Medications on File Prior to Visit  Medication Sig Dispense Refill  . amitriptyline (ELAVIL) 50 MG tablet Take 1 tablet (50 mg total) by mouth at bedtime. 30 tablet 2  . aspirin 81 MG tablet Take 81 mg by mouth daily.      . carvedilol (COREG) 6.25 MG tablet Take 1 tablet (6.25 mg total) by mouth 2 (two) times daily. 180 tablet 3  . clopidogrel (PLAVIX) 75 MG tablet Take 1 tablet (75 mg total) by mouth daily. 90 tablet 3  . Continuous Blood Gluc Sensor (FREESTYLE LIBRE 14 DAY SENSOR) MISC 1 each by Does not apply route every 14 (fourteen) days. Change every 2 weeks 6 each 3  . cyclobenzaprine (FLEXERIL) 5 MG tablet Take 1 tablet (5 mg total) by mouth 3 (three) times daily as needed for muscle spasms. 90 tablet 2  . enalapril (VASOTEC) 20 MG tablet Take 1 tablet by mouth once daily for blood pressure. 90 tablet 3  . ezetimibe (ZETIA) 10 MG tablet Take 1 tablet (10 mg total) by mouth daily. 90 tablet 3  . gabapentin (NEURONTIN) 300 MG capsule Take 1 capsule (300 mg total) by mouth 3 (three) times daily. 90 capsule 2  . glucose blood (FREESTYLE LITE) test strip USE 1 STRIP TO CHECK BLOOD SUGAR 5 TIMES DAILY FOR UNCONTROLLED DIABETES MELLITUS 400 each 5  . glucose monitoring kit (FREESTYLE) monitoring kit 1 each by Does not apply route as needed for other. PHARMACY PLEASE SUBSTITUTE WHATEVER GLUCOMETER YOU CARRY 1 each 1  . insulin aspart (NOVOLOG FLEXPEN) 100 UNIT/ML FlexPen Inject 20 units before breakfast and lunch, inject 24 units before supper. 30 mL 5  . Insulin Glargine (LANTUS SOLOSTAR) 100 UNIT/ML Solostar Pen Inject 44 Units into the skin at bedtime. 15 mL 5  . Insulin  Pen Needle (INSUPEN PEN NEEDLES) 32G X 4 MM MISC Use to inject insulin 4 times daily. 250 each 1  . isosorbide mononitrate (IMDUR) 30 MG 24 hr tablet Take 1 tablet (30 mg total) by mouth daily. 90 tablet 3  . ketorolac (TORADOL) 10 MG tablet Take 1 tablet (10  mg total) by mouth every 8 (eight) hours. 15 tablet 0  . nystatin cream (MYCOSTATIN) Apply 1 application topically 2 (two) times daily. Apply a thin layer to clean and dry skin for up to 3 weeks. 30 g 0  . ondansetron (ZOFRAN-ODT) 8 MG disintegrating tablet Take 1 tablet (8 mg total) by mouth every 8 (eight) hours as needed for nausea. 20 tablet 0  . rosuvastatin (CRESTOR) 40 MG tablet Take 1 tablet (40 mg total) by mouth daily. 90 tablet 3  . traZODone (DESYREL) 100 MG tablet Take 1 tablet (100 mg total) by mouth at bedtime. 90 tablet 1  . [DISCONTINUED] levothyroxine (SYNTHROID, LEVOTHROID) 125 MCG tablet TAKE 1 TABLET BY MOUTH ONCE DAILY 90 tablet 3   No current facility-administered medications on file prior to visit.     There were no vitals taken for this visit.   Objective:   Physical Exam  Constitutional: She is oriented to person, place, and time. She appears well-nourished.  Respiratory: Effort normal.  Neurological: She is alert and oriented to person, place, and time.  Psychiatric: She has a normal mood and affect.           Assessment & Plan:

## 2018-11-26 NOTE — Assessment & Plan Note (Signed)
TSH too low, over treated with levothyroxine 125 mcg. Discussed proper directions for taking levothyroxine and to take with water only. Reduce dose to 112 mcg with repeat labs at next visit.

## 2018-11-26 NOTE — Assessment & Plan Note (Signed)
Using Trazodone HS seldomly. Continue same.

## 2018-11-26 NOTE — Assessment & Plan Note (Signed)
Asymptomatic. Doing well overall.

## 2018-11-26 NOTE — Assessment & Plan Note (Signed)
Following with pain management, doing well on gabapentin.

## 2018-11-29 ENCOUNTER — Other Ambulatory Visit: Payer: Medicare HMO

## 2018-11-29 NOTE — Addendum Note (Signed)
Addended by: Ellamae Sia on: 11/29/2018 12:49 PM   Modules accepted: Orders

## 2018-12-01 ENCOUNTER — Ambulatory Visit (INDEPENDENT_AMBULATORY_CARE_PROVIDER_SITE_OTHER): Payer: Medicare HMO | Admitting: Gastroenterology

## 2018-12-01 ENCOUNTER — Ambulatory Visit: Payer: Medicare HMO | Admitting: Gastroenterology

## 2018-12-01 DIAGNOSIS — R1310 Dysphagia, unspecified: Secondary | ICD-10-CM | POA: Diagnosis not present

## 2018-12-01 NOTE — Progress Notes (Signed)
[name] , MD [address]  [place]  [place],  [address]  Main: [phone]  Fax: [phone]   Primary Care Physician: [name], NP  Virtual Visit via Telephone Note  I connected with patient on 12/01/18 at  9:00 AM EDT by telephone and verified that I am speaking with the correct person using two identifiers.   I discussed the limitations, risks, security and privacy concerns of performing an evaluation and management service by telephone and the availability of in person appointments. I also discussed with the patient that there may be a patient responsible charge related to this service. The patient expressed understanding and agreed to proceed.  Location of Patient: Home Location of Provider: Home Persons involved: Patient and provider only   History of Present Illness: Chief Complaint  Patient presents with  . Follow-up    Dysphagia    HPI: Sarah Payne is a 73 y.o. female   Summary of history : She is here today to follow up for dysphagia , initially seen in 08/2018, symptoms began in 07/2018 , for solids , absent teeth in lower jaw , on famotidine .She has been previously a patient of [facility] last seen in 2017 for nausea or vomiting . Concern for diabetic gastroparesis in the past . EGD in 10/2015 that showed no abnormalities. Never had any heartburn.    Interval history   09/09/2018-  12/01/2018  09/21/2018: EGD:  Empirical dilation to 18 F, gastritis , esophagus BX- no EOE, reactive gastritis also noted in the stomach on BX.   Swallowing better, no issues with swallowing . She is yet to get her lower teeth which she will after the COVID she states. Taking her Famotidine.  No GERD symptoms.   Current Outpatient Medications  Medication Sig Dispense Refill  . amitriptyline (ELAVIL) 50 MG tablet Take 1 tablet (50 mg total) by mouth at bedtime. 30 tablet 2  . aspirin 81 MG tablet Take 81 mg by mouth daily.      . carvedilol (COREG) 6.25 MG  tablet Take 1 tablet (6.25 mg total) by mouth 2 (two) times daily. 180 tablet 3  . clopidogrel (PLAVIX) 75 MG tablet Take 1 tablet (75 mg total) by mouth daily. 90 tablet 3  . Continuous Blood Gluc Sensor (FREESTYLE LIBRE 14 DAY SENSOR) MISC 1 each by Does not apply route every 14 (fourteen) days. Change every 2 weeks 6 each 3  . cyclobenzaprine (FLEXERIL) 5 MG tablet Take 1 tablet (5 mg total) by mouth 3 (three) times daily as needed for muscle spasms. 90 tablet 2  . enalapril (VASOTEC) 20 MG tablet Take 1 tablet by mouth once daily for blood pressure. 90 tablet 3  . ezetimibe (ZETIA) 10 MG tablet Take 1 tablet (10 mg total) by mouth daily. 90 tablet 3  . gabapentin (NEURONTIN) 300 MG capsule Take 1 capsule (300 mg total) by mouth 3 (three) times daily. 90 capsule 2  . glucose blood (FREESTYLE LITE) test strip USE 1 STRIP TO CHECK BLOOD SUGAR 5 TIMES DAILY FOR UNCONTROLLED DIABETES MELLITUS 400 each 5  . glucose monitoring kit (FREESTYLE) monitoring kit 1 each by Does not apply route as needed for other. PHARMACY PLEASE SUBSTITUTE WHATEVER GLUCOMETER YOU CARRY 1 each 1  . insulin aspart (NOVOLOG FLEXPEN) 100 UNIT/ML FlexPen Inject 20 units before breakfast and lunch, inject 24 units before supper. 30 mL 5  . Insulin Glargine (LANTUS SOLOSTAR) 100 UNIT/ML Solostar Pen Inject 44 Units into  the skin at bedtime. 15 mL 5  . Insulin Pen Needle (INSUPEN PEN NEEDLES) 32G X 4 MM MISC Use to inject insulin 4 times daily. 250 each 1  . isosorbide mononitrate (IMDUR) 30 MG 24 hr tablet Take 1 tablet (30 mg total) by mouth daily. 90 tablet 3  . levothyroxine (SYNTHROID) 112 MCG tablet Take 1 tablet by mouth every morning on an empty stomach. No food or other medications for 30 minutes. 30 tablet 1  . nystatin cream (MYCOSTATIN) Apply 1 application topically 2 (two) times daily. Apply a thin layer to clean and dry skin for up to 3 weeks. 30 g 0  . ondansetron (ZOFRAN-ODT) 8 MG disintegrating tablet Take 1 tablet  (8 mg total) by mouth every 8 (eight) hours as needed for nausea. 20 tablet 0  . rosuvastatin (CRESTOR) 40 MG tablet Take 1 tablet (40 mg total) by mouth daily. 90 tablet 3  . traZODone (DESYREL) 100 MG tablet Take 1 tablet (100 mg total) by mouth at bedtime. 90 tablet 1  . ketorolac (TORADOL) 10 MG tablet Take 1 tablet (10 mg total) by mouth every 8 (eight) hours. (Patient not taking: Reported on 12/01/2018) 15 tablet 0   No current facility-administered medications for this visit.     Allergies as of 12/01/2018 - Review Complete 12/01/2018  Allergen Reaction Noted  . No known allergies  08/31/2013    Review of Systems:    All systems reviewed and negative except where noted in HPI.   Observations/Objective:  Labs: CMP     Component Value Date/Time   NA 135 08/24/2018 1425   NA 136 08/02/2014 0408   K 3.8 08/24/2018 1425   K 3.1 (L) 08/02/2014 0408   CL 99 08/24/2018 1425   CL 100 08/02/2014 0408   CO2 28 08/24/2018 1425   CO2 28 08/02/2014 0408   GLUCOSE 342 (H) 08/24/2018 1425   GLUCOSE 137 (H) 08/02/2014 0408   BUN 17 08/24/2018 1425   BUN 15 08/02/2014 0408   CREATININE 1.18 (H) 08/24/2018 1425   CREATININE 1.06 (H) 04/04/2016 1721   CALCIUM 9.2 08/24/2018 1425   CALCIUM 8.5 08/02/2014 0408   PROT 6.6 11/17/2017 1009   PROT 6.9 06/12/2014 1703   ALBUMIN 3.9 11/17/2017 1009   ALBUMIN 3.5 06/12/2014 1703   AST 14 11/17/2017 1009   AST 21 06/12/2014 1703   ALT 12 11/17/2017 1009   ALT 16 06/12/2014 1703   ALKPHOS 76 11/17/2017 1009   ALKPHOS 73 06/12/2014 1703   BILITOT 0.3 11/17/2017 1009   BILITOT 0.3 06/12/2014 1703   GFRNONAA 47 (L) 08/24/2018 1425   GFRNONAA 46 (L) 08/02/2014 0408   GFRNONAA 47 (L) 08/20/2013 0507   GFRNONAA 55 (L) 11/24/2011 1541   GFRAA 54 (L) 08/24/2018 1425   GFRAA 56 (L) 08/02/2014 0408   GFRAA 55 (L) 08/20/2013 0507   GFRAA 64 11/24/2011 1541   Lab Results  Component Value Date   WBC 10.9 (H) 08/24/2018   HGB 14.3 08/24/2018    HCT 43.0 08/24/2018   MCV 92.3 08/24/2018   PLT 274 08/24/2018    Imaging Studies: No results found.  Assessment and Plan:   Sarah Payne is a 73 y.o. y/o female here to follow up  for dysphagia. S/p dilation and doing well, no symptoms  Plan  1. Continue Famotidine    F/u PRN    I discussed the assessment and treatment plan with the patient. The patient was provided an opportunity  to ask questions and all were answered. The patient agreed with the plan and demonstrated an understanding of the instructions.   The patient was advised to call back or seek an in-person evaluation if the symptoms worsen or if the condition fails to improve as anticipated.  I provided 11 minutes of non-face-to-face time during this encounter.  Dr [name] MD,MRCP [facility]) Gastroenterology/Hepatology Pager: [phone]   Speech recognition software was used to dictate this note.

## 2018-12-24 ENCOUNTER — Other Ambulatory Visit (INDEPENDENT_AMBULATORY_CARE_PROVIDER_SITE_OTHER): Payer: Medicare HMO

## 2018-12-24 ENCOUNTER — Telehealth: Payer: Self-pay | Admitting: Primary Care

## 2018-12-24 DIAGNOSIS — E1151 Type 2 diabetes mellitus with diabetic peripheral angiopathy without gangrene: Secondary | ICD-10-CM

## 2018-12-24 DIAGNOSIS — I10 Essential (primary) hypertension: Secondary | ICD-10-CM | POA: Diagnosis not present

## 2018-12-24 DIAGNOSIS — E782 Mixed hyperlipidemia: Secondary | ICD-10-CM

## 2018-12-24 LAB — LIPID PANEL
Cholesterol: 219 mg/dL — ABNORMAL HIGH (ref 0–200)
HDL: 38.5 mg/dL — ABNORMAL LOW (ref >39.00)
NonHDL: 180.17
Total CHOL/HDL Ratio: 6
Triglycerides: 307 mg/dL — ABNORMAL HIGH (ref 0.0–149.0)
VLDL: 61.4 mg/dL — ABNORMAL HIGH (ref 0.0–40.0)

## 2018-12-24 LAB — COMPREHENSIVE METABOLIC PANEL
ALT: 10 U/L (ref 0–35)
AST: 12 U/L (ref 0–37)
Albumin: 4.3 g/dL (ref 3.5–5.2)
Alkaline Phosphatase: 80 U/L (ref 39–117)
BUN: 19 mg/dL (ref 6–23)
CO2: 31 mEq/L (ref 19–32)
Calcium: 9.7 mg/dL (ref 8.4–10.5)
Chloride: 100 mEq/L (ref 96–112)
Creatinine, Ser: 1.05 mg/dL (ref 0.40–1.20)
GFR: 51.69 mL/min — ABNORMAL LOW (ref >60.00)
Glucose, Bld: 92 mg/dL (ref 70–99)
Potassium: 3.9 mEq/L (ref 3.5–5.1)
Sodium: 137 mEq/L (ref 135–145)
Total Bilirubin: 0.4 mg/dL (ref 0.2–1.2)
Total Protein: 7.1 g/dL (ref 6.0–8.3)

## 2018-12-24 LAB — LDL CHOLESTEROL, DIRECT: Direct LDL: 143 mg/dL

## 2018-12-24 LAB — HEMOGLOBIN A1C: Hgb A1c MFr Bld: 10.3 % — ABNORMAL HIGH (ref 4.6–6.5)

## 2018-12-24 NOTE — Telephone Encounter (Signed)
Patient dropped off her blood sugar readings for the month for her appointment with Anda Kraft on 12/27/18. Form is in the rx tower.

## 2018-12-27 ENCOUNTER — Ambulatory Visit (INDEPENDENT_AMBULATORY_CARE_PROVIDER_SITE_OTHER): Payer: Medicare HMO | Admitting: Primary Care

## 2018-12-27 DIAGNOSIS — E1151 Type 2 diabetes mellitus with diabetic peripheral angiopathy without gangrene: Secondary | ICD-10-CM

## 2018-12-27 DIAGNOSIS — E782 Mixed hyperlipidemia: Secondary | ICD-10-CM | POA: Diagnosis not present

## 2018-12-27 NOTE — Progress Notes (Signed)
Subjective:    Patient ID: Sarah Payne, female    DOB: 1945-11-24, 73 y.o.   MRN: [id number]  HPI     Sarah Payne - 73 y.o. female  MRN [id number]  Date of Birth: 05-15-1946  PCP: [name], NP  This service was provided via telemedicine. Phone Visit performed on 12/27/2018    Rationale for phone visit along with limitations reviewed. Patient consented to telephone encounter.    Location of patient: Home Location of provider: Office Sarah @ [facility] Name of referring provider: N/A   Names of persons and role in encounter: Provider: [name], NP  Patient: Sarah Payne  Other: N/A   Time on call: 8 min and 37 seconds    Subjective: No chief complaint on file.    HPI:  Sarah Payne is a 73 year old female who presents today for follow up of diabetes.  Current medications include: Lantus 44 units, Novolog 20 units breakfast and lunch, 18 before dinner.  She is checking her blood glucose 3-4 times daily and is getting readings of:  AM fasting: high 100's-low 200's Before lunch: high 90's-mid 100's Before dinner: low 100's-low 200's Bedtime: 295, 171, 242  Last A1C: 13.1 in February 2020, 10.3 in June 2020 Last Eye Exam: Due Last Foot Exam: Due in Spring 2021 Pneumonia Vaccination: Due in Fall 2020 ACE/ARB: ACE Statin: Crestor 40 mg, Zetia. Recent LDL of 143  Diet currently consists of:  Breakfast: Skips Lunch: Sandwich, french fries Dinner: Meat, vegetables, starch Snacks: Peanut butter crackers  Desserts: None Beverages: Diet soda, regular Pepsi at times. Some water.  Exercise: She is not exercising, some walking.   Objective/Observations:   No physical exam or vital signs collected unless specifically identified below.   There were no vitals taken for this visit.   Respiratory status: speaks in complete sentences without evident shortness of breath.   Assessment/Plan:  See problem based charting.  No problem-specific  Assessment & Plan notes found for this encounter.   I discussed the assessment and treatment plan with the patient. The patient was provided an opportunity to ask questions and all were answered. The patient agreed with the plan and demonstrated an understanding of the instructions.  Lab Orders  No laboratory test(s) ordered today    No orders of the defined types were placed in this encounter.   The patient was advised to call back or seek an in-person evaluation if the symptoms worsen or if the condition fails to improve as anticipated.  [name], NP    Review of Systems  Eyes: Negative for visual disturbance.  Respiratory: Negative for shortness of breath.   Cardiovascular: Negative for chest pain.  Neurological: Negative for dizziness and headaches.       Past Medical History:  Diagnosis Date  . Cataract    BILATERAL REMOVED  . Chronic low back pain   . COPD (chronic obstructive pulmonary disease) ([place])   . Coronary artery disease    a. multiple PCIs to the mLCx. b. stent to dRCA 08/2011 in setting of NSTEMI. c. DES to prox-mid LCx for ISR 09/2011. d.  DESx2 to prox-mRCA 07/2012. e. Takotsubo event 12/2015 with patent stents.  . Depression   . Diabetes mellitus   . Diverticulosis   . Frequent PVCs    a. Noted in hospital 12/2015.  Marland Kitchen GERD (gastroesophageal reflux disease)   . Hyperlipidemia   . Hypertension   . Hypothyroidism   .  Impingement syndrome of left shoulder   . Marijuana abuse   . Myocardial infarction ([place])    x 5  . Sleep apnea    mild-does not use cpap  . Takotsubo cardiomyopathy    a. 12/2015 - nephew committed suicide 1 week prior, sister died the morning of presentation - initially called a STEMI; cath with patent stents. LVEF 25-30%.  . Tendonitis of left rotator cuff   . Tobacco abuse   . Vascular dementia      Social History   Socioeconomic History  . Marital status: Married    Spouse name: Not on file  . Number of children: Not on  file  . Years of education: Not on file  . Highest education level: Not on file  Occupational History  . Not on file  Social Needs  . Financial resource strain: Not on file  . Food insecurity:    Worry: Not on file    Inability: Not on file  . Transportation needs:    Medical: Not on file    Non-medical: Not on file  Tobacco Use  . Smoking status: Current Every Day Smoker    Packs/day: 1.00    Years: 45.00    Pack years: 45.00    Types: Cigarettes  . Smokeless tobacco: Never Used  . Tobacco comment: Has cut back, trying to quit.   Substance and Sexual Activity  . Alcohol use: No    Alcohol/week: 0.0 standard drinks  . Drug use: Yes    Types: Marijuana    Comment: last noc  . Sexual activity: Not on file  Lifestyle  . Physical activity:    Days per week: Not on file    Minutes per session: Not on file  . Stress: Not on file  Relationships  . Social connections:    Talks on phone: Not on file    Gets together: Not on file    Attends religious service: Not on file    Active member of club or organization: Not on file    Attends meetings of clubs or organizations: Not on file    Relationship status: Not on file  . Intimate partner violence:    Fear of current or ex partner: Not on file    Emotionally abused: Not on file    Physically abused: Not on file    Forced sexual activity: Not on file  Other Topics Concern  . Not on file  Social History Narrative   Lives at home with her husband in [place].  Previously used marijuana - quit.      Regular exercise: no/ pain from a frozen rotator cuff   Caffeine use: coffee daily and pepsi      Does not have a living will.   Daughters and husband know her wishes- would desire CPR but not prolonged life support if futile    Past Surgical History:  Procedure Laterality Date  . ABDOMINAL HYSTERECTOMY    . APPENDECTOMY    . BACK SURGERY    . CARDIAC CATHETERIZATION  2013  . CARDIAC CATHETERIZATION  2016  . CARDIAC  CATHETERIZATION N/A 01/01/2016   Procedure: Left Heart Cath and Coronary Angiography;  Surgeon: Jettie Booze, MD;  Location: [place] [facility];  Service: Cardiovascular;  Laterality: N/A;  . CATARACT EXTRACTION W/PHACO Left 01/30/2015   Procedure: CATARACT EXTRACTION PHACO AND INTRAOCULAR LENS PLACEMENT (IOC);  Surgeon: Birder Robson, MD;  Location: [facility];  Service: Ophthalmology;  Laterality: Left;  [place] 00:47   .  CATARACT EXTRACTION W/PHACO Right 02/13/2015   Procedure: CATARACT EXTRACTION PHACO AND INTRAOCULAR LENS PLACEMENT (IOC);  Surgeon: Birder Robson, MD;  Location: [facility];  Service: Ophthalmology;  Laterality: Right;  cassette lot # [id number] H [place]  00:29.9 AP  20.7 CDE  6.20  . COLONOSCOPY N/A 11/02/2014   Procedure: COLONOSCOPY;  Surgeon: Inda Castle, MD;  Location: [place];  Service: Endoscopy;  Laterality: N/A;  . CORONARY ANGIOPLASTY  2012   stent x 3   . CORONARY ANGIOPLASTY WITH STENT PLACEMENT  2013  . ESOPHAGOGASTRODUODENOSCOPY (EGD) WITH PROPOFOL N/A 09/21/2018   Procedure: ESOPHAGOGASTRODUODENOSCOPY (EGD) WITH PROPOFOL;  Surgeon: [name], MD;  Location: [facility];  Service: Gastroenterology;  Laterality: N/A;    Family History  Problem Relation Age of Onset  . Heart attack Mother        First MI @ 15 - Died @ 1  . Heart disease Mother   . Heart disease Father        Died @ 66  . Throat cancer Brother   . Liver cancer Brother   . Colon cancer Sister     Allergies  Allergen Reactions  . No Known Allergies     Current Outpatient Medications on File Prior to Visit  Medication Sig Dispense Refill  . amitriptyline (ELAVIL) 50 MG tablet Take 1 tablet (50 mg total) by mouth at bedtime. 30 tablet 2  . aspirin 81 MG tablet Take 81 mg by mouth daily.      . carvedilol (COREG) 6.25 MG tablet Take 1 tablet (6.25 mg total) by mouth 2 (two) times daily. 180 tablet 3  . clopidogrel (PLAVIX) 75 MG tablet Take 1 tablet (75 mg total) by mouth daily. 90  tablet 3  . Continuous Blood Gluc Sensor (FREESTYLE LIBRE 14 DAY SENSOR) MISC 1 each by Does not apply route every 14 (fourteen) days. Change every 2 weeks 6 each 3  . cyclobenzaprine (FLEXERIL) 5 MG tablet Take 1 tablet (5 mg total) by mouth 3 (three) times daily as needed for muscle spasms. 90 tablet 2  . enalapril (VASOTEC) 20 MG tablet Take 1 tablet by mouth once daily for blood pressure. 90 tablet 3  . ezetimibe (ZETIA) 10 MG tablet Take 1 tablet (10 mg total) by mouth daily. 90 tablet 3  . gabapentin (NEURONTIN) 300 MG capsule Take 1 capsule (300 mg total) by mouth 3 (three) times daily. 90 capsule 2  . glucose blood (FREESTYLE LITE) test strip USE 1 STRIP TO CHECK BLOOD SUGAR 5 TIMES DAILY FOR UNCONTROLLED DIABETES MELLITUS 400 each 5  . glucose monitoring kit (FREESTYLE) monitoring kit 1 each by Does not apply route as needed for other. PHARMACY PLEASE SUBSTITUTE WHATEVER GLUCOMETER YOU CARRY 1 each 1  . insulin aspart (NOVOLOG FLEXPEN) 100 UNIT/ML FlexPen Inject 20 units before breakfast and lunch, inject 24 units before supper. 30 mL 5  . Insulin Glargine (LANTUS SOLOSTAR) 100 UNIT/ML Solostar Pen Inject 44 Units into the skin at bedtime. 15 mL 5  . Insulin Pen Needle (INSUPEN PEN NEEDLES) 32G X 4 MM MISC Use to inject insulin 4 times daily. 250 each 1  . isosorbide mononitrate (IMDUR) 30 MG 24 hr tablet Take 1 tablet (30 mg total) by mouth daily. 90 tablet 3  . ketorolac (TORADOL) 10 MG tablet Take 1 tablet (10 mg total) by mouth every 8 (eight) hours. (Patient not taking: Reported on 12/01/2018) 15 tablet 0  . levothyroxine (SYNTHROID) 112 MCG tablet Take 1 tablet by mouth  every morning on an empty stomach. No food or other medications for 30 minutes. 30 tablet 1  . nystatin cream (MYCOSTATIN) Apply 1 application topically 2 (two) times daily. Apply a thin layer to clean and dry skin for up to 3 weeks. 30 g 0  . ondansetron (ZOFRAN-ODT) 8 MG disintegrating tablet Take 1 tablet (8 mg total)  by mouth every 8 (eight) hours as needed for nausea. 20 tablet 0  . rosuvastatin (CRESTOR) 40 MG tablet Take 1 tablet (40 mg total) by mouth daily. 90 tablet 3  . traZODone (DESYREL) 100 MG tablet Take 1 tablet (100 mg total) by mouth at bedtime. 90 tablet 1   No current facility-administered medications on file prior to visit.     There were no vitals taken for this visit.   Objective:   Physical Exam  Constitutional: She is oriented to person, place, and time.  Respiratory: Effort normal.  Neurological: She is alert and oriented to person, place, and time.  Psychiatric: She has a normal mood and affect.           Assessment & Plan:

## 2018-12-27 NOTE — Assessment & Plan Note (Signed)
Compliant to Crestor and Zetia. Recent lipid panel above goal but overall stable for her. Continue current regimen.

## 2018-12-27 NOTE — Assessment & Plan Note (Signed)
Decrease in A1C from 13.1 to 10.3, commended her on this improvement.   Compliant to prescribed regimen, also checking glucose as directed. Discussed to continue to limit intake of Pepsi and junk food. Increase water intake.  Increase Novolog evening to 20 units. Continue 20 units with breakfast and lunch. Continue Lantus 44 units HS for now, consider increasing if AM fasting readings remain above 150.   We will see her back in the office in one month for follow up.

## 2018-12-27 NOTE — Patient Instructions (Signed)
We've increased your Novolog to 20 units in the evening. Continue injecting 20 units with breakfast and lunch.  Continue injecting 44 units of Lantus in the evening.   Reduce junk food and Pepsi. Increase water intake.  Please schedule a follow up appointment in 1 month.  It was a pleasure to see you today!

## 2019-01-17 ENCOUNTER — Ambulatory Visit (INDEPENDENT_AMBULATORY_CARE_PROVIDER_SITE_OTHER)
Admission: RE | Admit: 2019-01-17 | Discharge: 2019-01-17 | Disposition: A | Discharge status: Home / Self Care | Payer: Medicare HMO | Source: Ambulatory Visit | Attending: Primary Care | Admitting: Primary Care

## 2019-01-17 ENCOUNTER — Ambulatory Visit (INDEPENDENT_AMBULATORY_CARE_PROVIDER_SITE_OTHER): Payer: Medicare HMO | Admitting: Primary Care

## 2019-01-17 ENCOUNTER — Encounter: Payer: Self-pay | Admitting: Primary Care

## 2019-01-17 ENCOUNTER — Other Ambulatory Visit: Payer: Self-pay

## 2019-01-17 VITALS — BP 130/86 | HR 73 | Temp 97.5°F | Ht 66.0 in | Wt 147.8 lb

## 2019-01-17 DIAGNOSIS — M25512 Pain in left shoulder: Secondary | ICD-10-CM | POA: Insufficient documentation

## 2019-01-17 DIAGNOSIS — F5101 Primary insomnia: Secondary | ICD-10-CM | POA: Diagnosis not present

## 2019-01-17 DIAGNOSIS — M19012 Primary osteoarthritis, left shoulder: Secondary | ICD-10-CM | POA: Diagnosis not present

## 2019-01-17 DIAGNOSIS — I10 Essential (primary) hypertension: Secondary | ICD-10-CM

## 2019-01-17 DIAGNOSIS — E039 Hypothyroidism, unspecified: Secondary | ICD-10-CM

## 2019-01-17 HISTORY — DX: Pain in left shoulder: M25.512

## 2019-01-17 LAB — BASIC METABOLIC PANEL
BUN: 16 mg/dL (ref 6–23)
CO2: 23 mEq/L (ref 19–32)
Calcium: 9.2 mg/dL (ref 8.4–10.5)
Chloride: 103 mEq/L (ref 96–112)
Creatinine, Ser: 0.92 mg/dL (ref 0.40–1.20)
GFR: 60.2 mL/min (ref >60.00)
Glucose, Bld: 134 mg/dL — ABNORMAL HIGH (ref 70–99)
Potassium: 4.2 mEq/L (ref 3.5–5.1)
Sodium: 137 mEq/L (ref 135–145)

## 2019-01-17 LAB — TSH: TSH: 1.43 u[IU]/mL (ref 0.35–4.50)

## 2019-01-17 MED ORDER — KETOROLAC TROMETHAMINE 60 MG/2ML IM SOLN
60.0000 mg | Freq: Once | INTRAMUSCULAR | Status: AC
  Administered 2019-01-17: 60 mg via INTRAMUSCULAR

## 2019-01-17 MED ORDER — PREDNISONE 20 MG PO TABS: ORAL_TABLET | ORAL | 0 refills | Status: DC

## 2019-01-17 MED ORDER — TRAZODONE HCL 100 MG PO TABS: 100.0000 mg | ORAL_TABLET | Freq: Every day | ORAL | 1 refills | Status: DC

## 2019-01-17 NOTE — Addendum Note (Signed)
Addended by: Jacqualin Combes on: 01/17/2019 10:11 AM   Modules accepted: Orders

## 2019-01-17 NOTE — Progress Notes (Signed)
Subjective:    Patient ID: Sarah [facility], female    DOB: 01-07-1946, 73 y.o.   MRN: [id number]  HPI  Sarah [facility] is a 73 year old female with a history of CHF, CAD, Type 2 Diabetes, COPD, Hypothyroidism, Chronic shoulder pain, chronic MSK pain who presents today with a chief complaint of shoulder pain.   Her pain is located to the left shoulder which began about three weeks ago which starts to the left mid shoulder with radiation down to her mid biceps region. She underwent "shoulder surgery" three years ago, thinks this was rotator cuff repair.   She's been lifting her grandchild more frequently for which she thinks aggravated her discomfort. She is following with pain management and is taking gabapentin and Aleve without improvement to her left shoulder.   Review of Systems  Constitutional: Negative for fever.  Respiratory: Negative for shortness of breath.   Cardiovascular: Negative for chest pain.  Musculoskeletal: Positive for arthralgias. Negative for joint swelling.  Skin: Negative for color change.       Past Medical History:  Diagnosis Date  . Cataract    BILATERAL REMOVED  . Chronic low back pain   . COPD (chronic obstructive pulmonary disease) ([place])   . Coronary artery disease    a. multiple PCIs to the mLCx. b. stent to dRCA 08/2011 in setting of NSTEMI. c. DES to prox-mid LCx for ISR 09/2011. d.  DESx2 to prox-mRCA 07/2012. e. Takotsubo event 12/2015 with patent stents.  . Depression   . Diabetes mellitus   . Diverticulosis   . Frequent PVCs    a. Noted in hospital 12/2015.  Marland Kitchen GERD (gastroesophageal reflux disease)   . Hyperlipidemia   . Hypertension   . Hypothyroidism   . Impingement syndrome of left shoulder   . Marijuana abuse   . Myocardial infarction ([place])    x 5  . Sleep apnea    mild-does not use cpap  . Takotsubo cardiomyopathy    a. 12/2015 - nephew committed suicide 1 week prior, sister died the morning of presentation - initially called a STEMI; cath  with patent stents. LVEF 25-30%.  . Tendonitis of left rotator cuff   . Tobacco abuse   . Vascular dementia      Social History   Socioeconomic History  . Marital status: Married    Spouse name: Not on file  . Number of children: Not on file  . Years of education: Not on file  . Highest education level: Not on file  Occupational History  . Not on file  Social Needs  . Financial resource strain: Not on file  . Food insecurity    Worry: Not on file    Inability: Not on file  . Transportation needs    Medical: Not on file    Non-medical: Not on file  Tobacco Use  . Smoking status: Current Every Day Smoker    Packs/day: 1.00    Years: 45.00    Pack years: 45.00    Types: Cigarettes  . Smokeless tobacco: Never Used  . Tobacco comment: Has cut back, trying to quit.   Substance and Sexual Activity  . Alcohol use: No    Alcohol/week: 0.0 standard drinks  . Drug use: Yes    Types: Marijuana    Comment: last noc  . Sexual activity: Not on file  Lifestyle  . Physical activity    Days per week: Not on file    Minutes per session: Not  on file  . Stress: Not on file  Relationships  . Social Herbalist on phone: Not on file    Gets together: Not on file    Attends religious service: Not on file    Active member of club or organization: Not on file    Attends meetings of clubs or organizations: Not on file    Relationship status: Not on file  . Intimate partner violence    Fear of current or ex partner: Not on file    Emotionally abused: Not on file    Physically abused: Not on file    Forced sexual activity: Not on file  Other Topics Concern  . Not on file  Social History Narrative   Lives at home with her husband in [place].  Previously used marijuana - quit.      Regular exercise: no/ pain from a frozen rotator cuff   Caffeine use: coffee daily and pepsi      Does not have a living will.   Daughters and husband know her wishes- would desire CPR but  not prolonged life support if futile    Past Surgical History:  Procedure Laterality Date  . ABDOMINAL HYSTERECTOMY    . APPENDECTOMY    . BACK SURGERY    . CARDIAC CATHETERIZATION  2013  . CARDIAC CATHETERIZATION  2016  . CARDIAC CATHETERIZATION N/A 01/01/2016   Procedure: Left Heart Cath and Coronary Angiography;  Surgeon: Jettie Booze, MD;  Location: [place] [facility];  Service: Cardiovascular;  Laterality: N/A;  . CATARACT EXTRACTION W/PHACO Left 01/30/2015   Procedure: CATARACT EXTRACTION PHACO AND INTRAOCULAR LENS PLACEMENT (IOC);  Surgeon: Birder Robson, MD;  Location: [facility];  Service: Ophthalmology;  Laterality: Left;  [place] 00:47   . CATARACT EXTRACTION W/PHACO Right 02/13/2015   Procedure: CATARACT EXTRACTION PHACO AND INTRAOCULAR LENS PLACEMENT (IOC);  Surgeon: Birder Robson, MD;  Location: [facility];  Service: Ophthalmology;  Laterality: Right;  cassette lot # [id number] H [place]  00:29.9 AP  20.7 CDE  6.20  . COLONOSCOPY N/A 11/02/2014   Procedure: COLONOSCOPY;  Surgeon: Inda Castle, MD;  Location: [place];  Service: Endoscopy;  Laterality: N/A;  . CORONARY ANGIOPLASTY  2012   stent x 3   . CORONARY ANGIOPLASTY WITH STENT PLACEMENT  2013  . ESOPHAGOGASTRODUODENOSCOPY (EGD) WITH PROPOFOL N/A 09/21/2018   Procedure: ESOPHAGOGASTRODUODENOSCOPY (EGD) WITH PROPOFOL;  Surgeon: [name], MD;  Location: [facility];  Service: Gastroenterology;  Laterality: N/A;  . SHOULDER SURGERY Left 2017    Family History  Problem Relation Age of Onset  . Heart attack Mother        First MI @ 54 - Died @ 14  . Heart disease Mother   . Heart disease Father        Died @ 36  . Throat cancer Brother   . Liver cancer Brother   . Colon cancer Sister     Allergies  Allergen Reactions  . No Known Allergies     Current Outpatient Medications on File Prior to Visit  Medication Sig Dispense Refill  . amitriptyline (ELAVIL) 50 MG tablet Take 1 tablet (50 mg total) by mouth at  bedtime. 30 tablet 2  . aspirin 81 MG tablet Take 81 mg by mouth daily.      . carvedilol (COREG) 6.25 MG tablet Take 1 tablet (6.25 mg total) by mouth 2 (two) times daily. 180 tablet 3  . clopidogrel (PLAVIX) 75 MG tablet Take 1  tablet (75 mg total) by mouth daily. 90 tablet 3  . Continuous Blood Gluc Sensor (FREESTYLE LIBRE 14 DAY SENSOR) MISC 1 each by Does not apply route every 14 (fourteen) days. Change every 2 weeks 6 each 3  . cyclobenzaprine (FLEXERIL) 5 MG tablet Take 1 tablet (5 mg total) by mouth 3 (three) times daily as needed for muscle spasms. 90 tablet 2  . enalapril (VASOTEC) 20 MG tablet Take 1 tablet by mouth once daily for blood pressure. 90 tablet 3  . ezetimibe (ZETIA) 10 MG tablet Take 1 tablet (10 mg total) by mouth daily. 90 tablet 3  . gabapentin (NEURONTIN) 300 MG capsule Take 1 capsule (300 mg total) by mouth 3 (three) times daily. 90 capsule 2  . glucose blood (FREESTYLE LITE) test strip USE 1 STRIP TO CHECK BLOOD SUGAR 5 TIMES DAILY FOR UNCONTROLLED DIABETES MELLITUS 400 each 5  . glucose monitoring kit (FREESTYLE) monitoring kit 1 each by Does not apply route as needed for other. PHARMACY PLEASE SUBSTITUTE WHATEVER GLUCOMETER YOU CARRY 1 each 1  . insulin aspart (NOVOLOG FLEXPEN) 100 UNIT/ML FlexPen Inject 20 units before breakfast and lunch, inject 24 units before supper. 30 mL 5  . Insulin Glargine (LANTUS SOLOSTAR) 100 UNIT/ML Solostar Pen Inject 44 Units into the skin at bedtime. 15 mL 5  . Insulin Pen Needle (INSUPEN PEN NEEDLES) 32G X 4 MM MISC Use to inject insulin 4 times daily. 250 each 1  . isosorbide mononitrate (IMDUR) 30 MG 24 hr tablet Take 1 tablet (30 mg total) by mouth daily. 90 tablet 3  . ketorolac (TORADOL) 10 MG tablet Take 1 tablet (10 mg total) by mouth every 8 (eight) hours. 15 tablet 0  . levothyroxine (SYNTHROID) 112 MCG tablet Take 1 tablet by mouth every morning on an empty stomach. No food or other medications for 30 minutes. 30 tablet 1  .  nystatin cream (MYCOSTATIN) Apply 1 application topically 2 (two) times daily. Apply a thin layer to clean and dry skin for up to 3 weeks. 30 g 0  . ondansetron (ZOFRAN-ODT) 8 MG disintegrating tablet Take 1 tablet (8 mg total) by mouth every 8 (eight) hours as needed for nausea. 20 tablet 0  . rosuvastatin (CRESTOR) 40 MG tablet Take 1 tablet (40 mg total) by mouth daily. 90 tablet 3   No current facility-administered medications on file prior to visit.     BP 130/86   Pulse 73   Temp (!) 97.5 F (36.4 C) (Tympanic)   Ht 5' 6"  (1.676 m)   Wt 147 lb 12 oz (67 kg)   SpO2 96%   BMI 23.85 kg/m    Objective:   Physical Exam  Constitutional: She appears well-nourished.  Respiratory: Effort normal.  Musculoskeletal:     Left shoulder: She exhibits decreased range of motion and pain. She exhibits no tenderness.     Comments: Moderate decrease in ROM to left shoulder and upper extremity due to pain. 5/5 strength noted.  Skin: Skin is warm and dry. No erythema.           Assessment & Plan:

## 2019-01-17 NOTE — Assessment & Plan Note (Signed)
Repeat TSH pending as last TSH too low.  Compliant to levothyroxine 112 mcg and is taking appropriately.

## 2019-01-17 NOTE — Patient Instructions (Addendum)
Stop by the lab and xray prior to leaving today. I will notify you of your results once received.   Start the prednisone tablets tomorrow. Take 2 tablets daily for three days, then 1 tablet daily for three days.  Schedule a follow up visit with Dr. Lorelei Pont if no improvement in your symptoms within one week.  It was a pleasure to see you today!

## 2019-01-17 NOTE — Assessment & Plan Note (Signed)
Acute x 3 weeks, has been lifting her three year old granddaughter. Exam today with marked decrease in ROM due to pain, strength appears intact.  IM Toradol 60 mg provided today. Rx for prednisone course sent to pharmacy. Discussed to monitor glucose readings closely given risks for hyperglycemia on prednisone.  If no improvement after one week then she will be scheduled to see Dr. Lorelei Pont.

## 2019-01-23 NOTE — Progress Notes (Signed)
Sarah Crescenzo T. Sarah Cecere, MD Primary Care and Sports Medicine [facility] at [name], [address] Phone: [phone]   FAX: [phone]  ARMYA WESTERHOFF - 73 y.o. female   MRN [id number]   Date of Birth: 11/19/1945  Visit Date: 01/24/2019   PCP: [name], NP   Referred by: [name], NP  Chief Complaint  Patient presents with   Shoulder Pain    Left   Subjective:   Sarah Payne is a 73 y.o. very pleasant female patient who presents with the following: shoulder pain  The patient noted above presents with shoulder pain that has been ongoing for 1 month. there is no history of trauma or accident. The patient denies neck pain or radicular symptoms. No shoulder blade pain Denies dislocation, subluxation, separation of the shoulder. The patient does complain of pain with flexion, abduction, and terminal motion.  Significant restriction of motion. she describes a deep ache around the shoulder, and sometimes it will wake the patient up at night.  Diabetic patient who has chronic pain on gabapentin, elavil, and recently on oral steroids for shoulder pain:  H/o shoulder pain s/p:  Frozen shoulder.  Lab Results  Component Value Date   HGBA1C 10.3 (H) 12/24/2018    Intraarticular injection - great pain relief   Medications Tried: OTC, oral pred Ice or Heat: minimal help Tried PT: No  Prior shoulder Injury: No Prior surgery: per below Prior fracture: No  Diabetic patient who has chronic pain on gabapentin, elavil, and recently on oral steroids for shoulder pain:  H/o shoulder pain s/p:  Frozen shoulder.  Lab Results  Component Value Date   HGBA1C 10.3 (H) 12/24/2018    Intraarticular injection - great pain relief  NAME:  CHEN, HOLZMAN                 ACCOUNT NO.:  [id number]  MEDICAL RECORD NO.:  [id number]  LOCATION:                                 FACILITY:  PHYSICIAN:  [name]. Sarah Payne, M.D. DATE OF  BIRTH:  Oct 15, 1945  DATE OF PROCEDURE:  03/15/2014 DATE OF DISCHARGE:  03/15/2014                              OPERATIVE REPORT   PREOPERATIVE DIAGNOSES: 1. Left shoulder partial rotator cuff and partial labrum tear. 2. Left shoulder degenerative joint disease. 3. Left shoulder impingement.  POSTOPERATIVE DIAGNOSES: 1. Left shoulder partial rotator cuff and partial labrum tear. 2. Left shoulder degenerative joint disease. 3. Left shoulder impingement.  PROCEDURE: 1. Left shoulder EUA followed by arthroscopic debridement, partial     rotator cuff and partial labrum tear. 2. Left shoulder chondroplasty. 3. Left shoulder subacromial decompression.   Past Medical History, Surgical History, Social History, Family History, Medications, and allergies reviewed and updated if relevant.   Patient Active Problem List   Diagnosis Date Noted   Acute pain of left shoulder 01/17/2019   Chronic musculoskeletal pain 11/15/2018   Atherosclerosis of native coronary artery of native heart with stable angina pectoris ([place]) 05/17/2018   Chronic neck pain 02/10/2018   Chronic pain of right upper extremity 02/10/2018   Cervical spondylosis 02/10/2018   Neck pain 01/19/2018   Right shoulder pain 01/19/2018   Chronic pain  syndrome 01/19/2018   Lumbar degenerative disc disease 09/29/2017   Lumbar spondylosis with myelopathy 09/29/2017   Chronic bilateral back pain 05/19/2017   Insomnia 01/12/2017   Cannabis use disorder, mild, abuse 09/03/2016   Cognitive changes 09/03/2016   Takotsubo cardiomyopathy 01/02/2016   Frequent PVCs 17/40/8144   Acute systolic (congestive) heart failure ([place]) 01/01/2016   HTN (hypertension) 09/20/2015   Benign neoplasm of colon 11/02/2014   Internal hemorrhoids 11/02/2014   Adjustment disorder with mixed anxiety and depressed mood 08/16/2014   Esophageal reflux 05/23/2014   Diverticulosis of colon without hemorrhage 05/17/2014    Peripheral polyneuropathy 05/05/2014   Tobacco dependence 05/05/2014   COPD, moderate ([place]) 03/29/2013   Noncompliance with diabetes treatment 02/27/2013   Obstructive sleep apnea 81/85/6314   Chronic systolic congestive heart failure ([place]) 97/08/6376   Lichen sclerosus et atrophicus 05/20/2012   Bronchitis, chronic obstructive ([place]) 05/20/2012   Diabetes mellitus type 2 with peripheral artery disease ([place]) 08/28/2011   History of lumbar fusion 06/29/2011   Depression 06/24/2011   Hypothyroidism 05/03/2011   CAD, multiple vessel 02/24/2011   Tobacco abuse 02/24/2011   Mixed hyperlipidemia 02/24/2011    Past Medical History:  Diagnosis Date   Cataract    BILATERAL REMOVED   Chronic low back pain    COPD (chronic obstructive pulmonary disease) (HCC)    Coronary artery disease    a. multiple PCIs to the mLCx. b. stent to dRCA 08/2011 in setting of NSTEMI. c. DES to prox-mid LCx for ISR 09/2011. d.  DESx2 to prox-mRCA 07/2012. e. Takotsubo event 12/2015 with patent stents.   Depression    Diabetes mellitus    Diverticulosis    Frequent PVCs    a. Noted in hospital 12/2015.   GERD (gastroesophageal reflux disease)    Hyperlipidemia    Hypertension    Hypothyroidism    Impingement syndrome of left shoulder    Marijuana abuse    Myocardial infarction (HCC)    x 5   Sleep apnea    mild-does not use cpap   Takotsubo cardiomyopathy    a. 12/2015 - nephew committed suicide 1 week prior, sister died the morning of presentation - initially called a STEMI; cath with patent stents. LVEF 25-30%.   Tendonitis of left rotator cuff    Tobacco abuse    Vascular dementia     Past Surgical History:  Procedure Laterality Date   ABDOMINAL HYSTERECTOMY     APPENDECTOMY     BACK SURGERY     CARDIAC CATHETERIZATION  2013   CARDIAC CATHETERIZATION  2016   CARDIAC CATHETERIZATION N/A 01/01/2016   Procedure: Left Heart Cath and Coronary Angiography;  Surgeon:  Jettie Booze, MD;  Location: [facility];  Service: Cardiovascular;  Laterality: N/A;   CATARACT EXTRACTION W/PHACO Left 01/30/2015   Procedure: CATARACT EXTRACTION PHACO AND INTRAOCULAR LENS PLACEMENT (IOC);  Surgeon: Birder Robson, MD;  Location: [facility];  Service: Ophthalmology;  Laterality: Left;  [place] 00:47    CATARACT EXTRACTION W/PHACO Right 02/13/2015   Procedure: CATARACT EXTRACTION PHACO AND INTRAOCULAR LENS PLACEMENT (IOC);  Surgeon: Birder Robson, MD;  Location: [facility];  Service: Ophthalmology;  Laterality: Right;  cassette lot # [id number] H [place]  00:29.9 AP  20.7 CDE  6.20   COLONOSCOPY N/A 11/02/2014   Procedure: COLONOSCOPY;  Surgeon: Inda Castle, MD;  Location: [facility];  Service: Endoscopy;  Laterality: N/A;   CORONARY ANGIOPLASTY  2012   stent x 3    CORONARY ANGIOPLASTY  WITH STENT PLACEMENT  2013   ESOPHAGOGASTRODUODENOSCOPY (EGD) WITH PROPOFOL N/A 09/21/2018   Procedure: ESOPHAGOGASTRODUODENOSCOPY (EGD) WITH PROPOFOL;  Surgeon: [name], MD;  [facility];  Service: Gastroenterology;  Laterality: N/A;   SHOULDER SURGERY Left 2017    Social History   Socioeconomic History   Marital status: Married    Spouse name: Not on file   Number of children: Not on file   Years of education: Not on file   Highest education level: Not on file  Occupational History   Not on file  Social Needs   Financial resource strain: Not on file   Food insecurity    Worry: Not on file    Inability: Not on file   Transportation needs    Medical: Not on file    Non-medical: Not on file  Tobacco Use   Smoking status: Current Every Day Smoker    Packs/day: 1.00    Years: 45.00    Pack years: 45.00    Types: Cigarettes   Smokeless tobacco: Never Used   Tobacco comment: Has cut back, trying to quit.   Substance and Sexual Activity   Alcohol use: No    Alcohol/week: 0.0 standard drinks   Drug use: Yes    Types: Marijuana     Comment: last noc   Sexual activity: Not on file  Lifestyle   Physical activity    Days per week: Not on file    Minutes per session: Not on file   Stress: Not on file  Relationships   Social connections    Talks on phone: Not on file    Gets together: Not on file    Attends religious service: Not on file    Active member of club or organization: Not on file    Attends meetings of clubs or organizations: Not on file    Relationship status: Not on file   Intimate partner violence    Fear of current or ex partner: Not on file    Emotionally abused: Not on file    Physically abused: Not on file    Forced sexual activity: Not on file  Other Topics Concern   Not on file  Social History Narrative   Lives at home with her husband in [place].  Previously used marijuana - quit.      Regular exercise: no/ pain from a frozen rotator cuff   Caffeine use: coffee daily and pepsi      Does not have a living will.   Daughters and husband know her wishes- would desire CPR but not prolonged life support if futile    Family History  Problem Relation Age of Onset   Heart attack Mother        First MI @ 3 - Died @ 50   Heart disease Mother    Heart disease Father        Died @ 42   Throat cancer Brother    Liver cancer Brother    Colon cancer Sister     Allergies  Allergen Reactions   No Known Allergies     Medication list reviewed and updated in full in [place].  GEN: No fevers, chills. Nontoxic. Primarily MSK c/o today. MSK: Detailed in the HPI GI: tolerating PO intake without difficulty Neuro: No numbness, parasthesias, or tingling associated. Otherwise the pertinent positives of the ROS are noted above.    Objective:   Blood pressure 140/82, pulse 80, temperature 98.1 F (36.7 C), temperature  source Temporal, height 5' 6"  (1.676 m), weight 144 lb 8 oz (65.5 kg), SpO2 96 %.   GEN: WDWN, NAD, Non-toxic, Alert & Oriented x 3 HEENT: Atraumatic,  Normocephalic.  Ears and Nose: No external deformity. EXTR: No clubbing/cyanosis/edema NEURO: Normal gait.  PSYCH: Normally interactive. Conversant. Not depressed or anxious appearing.  Calm demeanor.   Shoulder: R and L Inspection: No muscle wasting or winging Ecchymosis/edema: neg  AC joint, scapula, clavicle: NT Cervical spine: NT, full ROM Spurling's: neg ABNORMAL SIDE TESTED: L UNLESS OTHERWISE NOTED, THE CONTRALATERAL SIDE HAS FULL RANGE OF MOTION. Abduction: 5/5, LIMITED TO 100 DEGREES Flexion: 5/5, LIMITED TO 100 DEGNO ROM  IR, lift-off: 5/5. TESTED AT 90 DEGREES OF ABDUCTION, LIMITED TO 0 DEGREES ER at neutral:  5/5, TESTED AT 90 DEGREES OF ABDUCTION, LIMITED TO 30 DEGREES AC crossover and compression: PAIN Drop Test: neg Empty Can: neg Supraspinatus insertion: NT Bicipital groove: NT ALL OTHER SPECIAL TESTING EQUIVOCAL GIVEN LOSS OF MOTION C5-T1 intact Sensation intact Grip 5/5   Assessment and Plan:     ICD-10-CM   1. Adhesive capsulitis of left shoulder  M75.02 methylPREDNISolone acetate (DEPO-MEDROL) injection 80 mg  2. Acute pain of left shoulder  M25.512   3. Diabetes mellitus type 2 with peripheral artery disease (HCC)  E11.51     >25 minutes spent in face to face time with patient, >50% spent in counselling or coordination of care  Patient was given a systematic ROM protocol from [employer] to be done daily.  The average length of total symptoms is 12-18 months going through 3 different phases in the freezing and thawing process. Reviewed all with patient.   Tylenol or NSAID of choice prn for pain relief Intraarticular shoulder injections discussed with patient, which have good evidence for accelerating the thawing phase.  Patient will be sent for formal PT for aggressive frozen shoulder ROM when freezing phase is oever. Will need RTC str and scapular stabilization to fix underlying mechanics.  I appreciate the opportunity to evaluate this very friendly  patient. If you have any question regarding her care or prognosis, do not hesitate to ask.  Intraarticular Shoulder Aspiration/Injection Procedure Note SYDNEI OHAVER Sep 14, 1945 Date of procedure: 01/24/2019  Procedure: Large Joint Aspiration / Injection of Shoulder, Intraarticular, LEFT Indications: Pain  Procedure Details Verbal consent was obtained from the patient. Risks including infection explained and contrasted with benefits and alternatives. Patient prepped with Chloraprep and Ethyl Chloride used for anesthesia. An intraarticular shoulder injection was performed using the posterior approach; needle placed into joint capsule without difficulty. The patient tolerated the procedure well and had decreased pain post injection. No complications. Injection: 8 cc of Lidocaine 1% and 2 mL Depo-Medrol 40 mg. Needle: 21 gauge, 2 inch   Follow-up: Return in about 8 weeks (around 03/21/2019).  Meds ordered this encounter  Medications   methylPREDNISolone acetate (DEPO-MEDROL) injection 80 mg   No orders of the defined types were placed in this encounter.   Signed,  Maud Deed. Carrell Palmatier, MD   Patient's Medications  New Prescriptions   No medications on file  Previous Medications   AMITRIPTYLINE (ELAVIL) 50 MG TABLET    Take 1 tablet (50 mg total) by mouth at bedtime.   ASPIRIN 81 MG TABLET    Take 81 mg by mouth daily.     CARVEDILOL (COREG) 6.25 MG TABLET    Take 1 tablet (6.25 mg total) by mouth 2 (two) times daily.   CLOPIDOGREL (PLAVIX) 75 MG TABLET  Take 1 tablet (75 mg total) by mouth daily.   CONTINUOUS BLOOD GLUC SENSOR (FREESTYLE LIBRE 14 DAY SENSOR) MISC    1 each by Does not apply route every 14 (fourteen) days. Change every 2 weeks   CYCLOBENZAPRINE (FLEXERIL) 5 MG TABLET    Take 1 tablet (5 mg total) by mouth 3 (three) times daily as needed for muscle spasms.   ENALAPRIL (VASOTEC) 20 MG TABLET    Take 1 tablet by mouth once daily for blood pressure.   EZETIMIBE (ZETIA)  10 MG TABLET    Take 1 tablet (10 mg total) by mouth daily.   GABAPENTIN (NEURONTIN) 300 MG CAPSULE    Take 1 capsule (300 mg total) by mouth 3 (three) times daily.   GLUCOSE BLOOD (FREESTYLE LITE) TEST STRIP    USE 1 STRIP TO CHECK BLOOD SUGAR 5 TIMES DAILY FOR UNCONTROLLED DIABETES MELLITUS   GLUCOSE MONITORING KIT (FREESTYLE) MONITORING KIT    1 each by Does not apply route as needed for other. PHARMACY PLEASE SUBSTITUTE WHATEVER GLUCOMETER YOU CARRY   INSULIN ASPART (NOVOLOG FLEXPEN) 100 UNIT/ML FLEXPEN    Inject 20 units before breakfast and lunch, inject 24 units before supper.   INSULIN GLARGINE (LANTUS SOLOSTAR) 100 UNIT/ML SOLOSTAR PEN    Inject 44 Units into the skin at bedtime.   INSULIN PEN NEEDLE (INSUPEN PEN NEEDLES) 32G X 4 MM MISC    Use to inject insulin 4 times daily.   ISOSORBIDE MONONITRATE (IMDUR) 30 MG 24 HR TABLET    Take 1 tablet (30 mg total) by mouth daily.   KETOROLAC (TORADOL) 10 MG TABLET    Take 1 tablet (10 mg total) by mouth every 8 (eight) hours.   LEVOTHYROXINE (SYNTHROID) 112 MCG TABLET    Take 1 tablet by mouth every morning on an empty stomach. No food or other medications for 30 minutes.   NYSTATIN CREAM (MYCOSTATIN)    Apply 1 application topically 2 (two) times daily. Apply a thin layer to clean and dry skin for up to 3 weeks.   ONDANSETRON (ZOFRAN-ODT) 8 MG DISINTEGRATING TABLET    Take 1 tablet (8 mg total) by mouth every 8 (eight) hours as needed for nausea.   PREDNISONE (DELTASONE) 20 MG TABLET    Take 2 tablets for 3 days, then 1 tablet for 3 days.   ROSUVASTATIN (CRESTOR) 40 MG TABLET    Take 1 tablet (40 mg total) by mouth daily.   TRAZODONE (DESYREL) 100 MG TABLET    Take 1 tablet (100 mg total) by mouth at bedtime. As needed for sleep.  Modified Medications   No medications on file  Discontinued Medications   No medications on file

## 2019-01-24 ENCOUNTER — Ambulatory Visit (INDEPENDENT_AMBULATORY_CARE_PROVIDER_SITE_OTHER): Payer: Medicare HMO | Admitting: Family Medicine

## 2019-01-24 ENCOUNTER — Other Ambulatory Visit: Payer: Self-pay

## 2019-01-24 ENCOUNTER — Encounter: Payer: Self-pay | Admitting: Family Medicine

## 2019-01-24 VITALS — BP 140/82 | HR 80 | Temp 98.1°F | Ht 66.0 in | Wt 144.5 lb

## 2019-01-24 DIAGNOSIS — E1151 Type 2 diabetes mellitus with diabetic peripheral angiopathy without gangrene: Secondary | ICD-10-CM | POA: Diagnosis not present

## 2019-01-24 DIAGNOSIS — M7502 Adhesive capsulitis of left shoulder: Secondary | ICD-10-CM

## 2019-01-24 DIAGNOSIS — M25512 Pain in left shoulder: Secondary | ICD-10-CM

## 2019-01-24 MED ORDER — METHYLPREDNISOLONE ACETATE 40 MG/ML IJ SUSP
80.0000 mg | Freq: Once | INTRAMUSCULAR | Status: AC
  Administered 2019-01-24: 80 mg via INTRA_ARTICULAR

## 2019-01-27 ENCOUNTER — Other Ambulatory Visit: Payer: Self-pay

## 2019-01-27 DIAGNOSIS — E1151 Type 2 diabetes mellitus with diabetic peripheral angiopathy without gangrene: Secondary | ICD-10-CM

## 2019-01-27 MED ORDER — NOVOLOG FLEXPEN 100 UNIT/ML ~~LOC~~ SOPN: PEN_INJECTOR | SUBCUTANEOUS | 5 refills | Status: DC

## 2019-01-27 NOTE — Telephone Encounter (Signed)
Pt left v/m requesting refill novolog pen to [facility] [address]. Pt has one pen left.Please advise.

## 2019-01-27 NOTE — Telephone Encounter (Signed)
Question on direction. Does it need to change to Inject 20 units before breakfast and lunch, inject 20 units before supper, according to the notes on 12/27/2018? Or keep it at Inject 20 units before breakfast and lunch, inject 24 units before supper?  Last prescribed on 09/24/2018. Last seen on 01/17/2019.

## 2019-01-27 NOTE — Telephone Encounter (Signed)
Noted.  Changes made to prescription.  Refill sent to pharmacy.

## 2019-02-03 ENCOUNTER — Encounter: Payer: Self-pay | Admitting: Student in an Organized Health Care Education/Training Program

## 2019-02-03 ENCOUNTER — Other Ambulatory Visit: Payer: Self-pay

## 2019-02-03 ENCOUNTER — Ambulatory Visit
Payer: Medicare HMO | Attending: Student in an Organized Health Care Education/Training Program | Admitting: Student in an Organized Health Care Education/Training Program

## 2019-02-03 NOTE — Progress Notes (Signed)
I attempted to call the patient however no response. Voicemail left instructing patient to call front desk office at [phone] to reschedule appointment. -Dr Natividad Schlosser  

## 2019-02-08 ENCOUNTER — Other Ambulatory Visit: Payer: Self-pay | Admitting: Primary Care

## 2019-02-08 DIAGNOSIS — E039 Hypothyroidism, unspecified: Secondary | ICD-10-CM

## 2019-03-20 NOTE — Progress Notes (Signed)
Sarah Coultas T. Kem Parcher, MD Primary Care and Sports Medicine [facility] at [facility] [place], [address] Phone: [phone]  FAX: [phone]  Sarah Payne - 73 y.o. female  MRN [id number]  Date of Birth: 1945/10/29  Visit Date: 03/21/2019  PCP: [name], NP  Referred by: [name], NP  Chief Complaint  Patient presents with  . Follow-up    Left Shoulder   Subjective:   Sarah Payne is a 73 y.o. very pleasant female patient with Body mass index is 24.41 kg/m. who presents with the following:  She is here in follow-up for left-sided adhesive capsulitis.  I saw her on January 24, 2019.  At that point she had had symptoms for about 1 month.  She also has a chronic pain patient who is on gabapentin, Elavil, issue recently prior to my office visit had taken some oral steroids.  She also has a quite elevated hemoglobin A1c, recently greater than 10.  In 2015 she does have a significant history for a left-sided partial rotator cuff tear and labral tear as well as some arthritis of the left glenohumeral joint.  At that point Dr. Lovena Neighbours did a debridement and partial rotator cuff tear and partial labral tear and left shoulder chondroplasty as well as a subacromial decompression.  She is here today in follow-up.  On her last office visit I did do an intra-articular injection on the left.  She is doing much better, approx 80% with decreased pain and increased rom  Past Medical History, Surgical History, Social History, Family History, Problem List, Medications, and Allergies have been reviewed and updated if relevant.  Patient Active Problem List   Diagnosis Date Noted  . Acute pain of left shoulder 01/17/2019  . Chronic musculoskeletal pain 11/15/2018  . Atherosclerosis of native coronary artery of native heart with stable angina pectoris ([place]) 05/17/2018  . Chronic neck pain 02/10/2018  . Chronic pain of right upper extremity  02/10/2018  . Cervical spondylosis 02/10/2018  . Neck pain 01/19/2018  . Right shoulder pain 01/19/2018  . Chronic pain syndrome 01/19/2018  . Lumbar degenerative disc disease 09/29/2017  . Lumbar spondylosis with myelopathy 09/29/2017  . Chronic bilateral back pain 05/19/2017  . Insomnia 01/12/2017  . Cannabis use disorder, mild, abuse 09/03/2016  . Cognitive changes 09/03/2016  . Takotsubo cardiomyopathy 01/02/2016  . Frequent PVCs 01/02/2016  . Acute systolic (congestive) heart failure ([place]) 01/01/2016  . HTN (hypertension) 09/20/2015  . Benign neoplasm of colon 11/02/2014  . Internal hemorrhoids 11/02/2014  . Adjustment disorder with mixed anxiety and depressed mood 08/16/2014  . Esophageal reflux 05/23/2014  . Diverticulosis of colon without hemorrhage 05/17/2014  . Peripheral polyneuropathy 05/05/2014  . Tobacco dependence 05/05/2014  . COPD, moderate ([place]) 03/29/2013  . Noncompliance with diabetes treatment 02/27/2013  . Obstructive sleep apnea 02/25/2013  . Chronic systolic congestive heart failure ([place]) 05/21/2012  . Lichen sclerosus et atrophicus 05/20/2012  . Bronchitis, chronic obstructive ([place]) 05/20/2012  . Diabetes mellitus type 2 with peripheral artery disease ([place]) 08/28/2011  . History of lumbar fusion 06/29/2011  . Depression 06/24/2011  . Hypothyroidism 05/03/2011  . CAD, multiple vessel 02/24/2011  . Tobacco abuse 02/24/2011  . Mixed hyperlipidemia 02/24/2011    Past Medical History:  Diagnosis Date  . Cataract    BILATERAL REMOVED  . Chronic low back pain   . COPD (chronic obstructive pulmonary disease) ([place])   . Coronary artery disease  a. multiple PCIs to the mLCx. b. stent to dRCA 08/2011 in setting of NSTEMI. c. DES to prox-mid LCx for ISR 09/2011. d.  DESx2 to prox-mRCA 07/2012. e. Takotsubo event 12/2015 with patent stents.  . Depression   . Diabetes mellitus   . Diverticulosis   . Frequent PVCs    a. Noted in hospital 12/2015.  Marland Kitchen GERD  (gastroesophageal reflux disease)   . Hyperlipidemia   . Hypertension   . Hypothyroidism   . Impingement syndrome of left shoulder   . Marijuana abuse   . 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 Sleep apnea    mild-does not use cpap  . Takotsubo cardiomyopathy    a. 12/2015 - nephew committed suicide 1 week prior, sister died the morning of presentation - initially called a STEMI; cath with patent stents. LVEF 25-30%.  . Tendonitis of left rotator cuff   . Tobacco abuse   . Vascular dementia     Past Surgical History:  Procedure Laterality Date  . ABDOMINAL HYSTERECTOMY    . APPENDECTOMY    . BACK SURGERY    . CARDIAC CATHETERIZATION  2013  . CARDIAC CATHETERIZATION  2016  . CARDIAC CATHETERIZATION N/A 01/01/2016   Procedure: Left Heart Cath and Coronary Angiography;  Surgeon: Jettie Booze, MD;  Location: [place] [facility];  Service: Cardiovascular;  Laterality: N/A;  . CATARACT EXTRACTION W/PHACO Left 01/30/2015   Procedure: CATARACT EXTRACTION PHACO AND INTRAOCULAR LENS PLACEMENT (IOC);  Surgeon: Birder Robson, MD;  Location: [facility] OR[facility];  Service: Ophthalmology;  Laterality: Left;  [place] 00:47   . CATARACT EXTRACTION W/PHACO Right 02/13/2015   Procedure: CATARACT EXTRACTION PHACO AND INTRAOCULAR LENS PLACEMENT (IOC);  Surgeon: Birder Robson, MD;  Location: [facility];  Service: Ophthalmology;  Laterality: Right;  cassette lot # [id number] H [id number]  00:29.9 AP  20.7 CDE  6.20  . COLONOSCOPY N/A 11/02/2014   Procedure: COLONOSCOPY;  Surgeon: Inda Castle, MD;  Location: [facility];  Service: Endoscopy;  Laterality: N/A;  . CORONARY ANGIOPLASTY  2012   stent x 3   . CORONARY ANGIOPLASTY WITH STENT PLACEMENT  2013  . ESOPHAGOGASTRODUODENOSCOPY (EGD) WITH PROPOFOL N/A 09/21/2018   Procedure: ESOPHAGOGASTRODUODENOSCOPY (EGD) WITH PROPOFOL;  Surgeon: [name], MD;  Location: [facility] ENDOSCOPY;  Service: Gastroenterology;  Laterality: N/A;  . SHOULDER SURGERY Left 2017    Social History    Socioeconomic History  . Marital status: Married    Spouse name: Not on file  . Number of children: Not on file  . Years of education: Not on file  . Highest education level: Not on file  Occupational History  . Not on file  Social Needs  . Financial resource strain: Not on file  . Food insecurity    Worry: Not on file    Inability: Not on file  . Transportation needs    Medical: Not on file    Non-medical: Not on file  Tobacco Use  . Smoking status: Current Every Day Smoker    Packs/day: 1.00    Years: 45.00    Pack years: 45.00    Types: Cigarettes  . Smokeless tobacco: Never Used  . Tobacco comment: Has cut back, trying to quit.   Substance and Sexual Activity  . Alcohol use: No    Alcohol/week: 0.0 standard drinks  . Drug use: Yes    Types: Marijuana    Comment: last noc  . Sexual activity: Not on file  Lifestyle  . Physical  activity    Days per week: Not on file    Minutes per session: Not on file  . Stress: Not on file  Relationships  . Social Herbalist on phone: Not on file    Gets together: Not on file    Attends religious service: Not on file    Active member of club or organization: Not on file    Attends meetings of clubs or organizations: Not on file    Relationship status: Not on file  . Intimate partner violence    Fear of current or ex partner: Not on file    Emotionally abused: Not on file    Physically abused: Not on file    Forced sexual activity: Not on file  Other Topics Concern  . Not on file  Social History Narrative   Lives at home with her husband in [place].  Previously used marijuana - quit.      Regular exercise: no/ pain from a frozen rotator cuff   Caffeine use: coffee daily and pepsi      Does not have a living will.   Daughters and husband know her wishes- would desire CPR but not prolonged life support if futile    Family History  Problem Relation Age of Onset  . Heart attack Mother        First MI @ 43  - Died @ 51  . Heart disease Mother   . Heart disease Father        Died @ 34  . Throat cancer Brother   . Liver cancer Brother   . Colon cancer Sister     Allergies  Allergen Reactions  . No Known Allergies     Medication list reviewed and updated in full in [place].  GEN: No fevers, chills. Nontoxic. Primarily MSK c/o today. MSK: Detailed in the HPI GI: tolerating PO intake without difficulty Neuro: No numbness, parasthesias, or tingling associated. Otherwise the pertinent positives of the ROS are noted above.   Objective:   BP (!) 150/78   Pulse 66   Temp 97.9 F (36.6 C) (Temporal)   Ht _0  (1.676 m)   Wt 151 lb 4 oz (68.6 kg)   SpO2 95%   BMI 24.41 kg/m    GEN: WDWN, NAD, Non-toxic, Alert & Oriented x 3 HEENT: Atraumatic, Normocephalic.  Ears and Nose: No external deformity. EXTR: No clubbing/cyanosis/edema NEURO: Normal gait.  PSYCH: Normally interactive. Conversant. Not depressed or anxious appearing.  Calm demeanor.   Shoulder: R and L Inspection: No muscle wasting or winging Ecchymosis/edema: neg  AC joint, scapula, clavicle: NT Cervical spine: NT, full ROM Spurling's: neg ABNORMAL SIDE TESTED: L UNLESS OTHERWISE NOTED, THE CONTRALATERAL SIDE HAS FULL RANGE OF MOTION. Abduction: 5/5, LIMITED TO 160 DEGREES Flexion: 5/5, LIMITED TO 160 DEGNO ROM  IR, lift-off: 5/5. TESTED AT 90 DEGREES OF ABDUCTION, LIMITED TO 15 DEGREES ER at neutral:  5/5, TESTED AT 90 DEGREES OF ABDUCTION, LIMITED TO 65 DEGREES AC crossover and compression: PAIN Drop Test: neg Empty Can: neg Supraspinatus insertion: NT Bicipital groove: NT ALL OTHER SPECIAL TESTING EQUIVOCAL GIVEN LOSS OF MOTION C5-T1 intact Sensation intact Grip 5/5   Radiology: No results found.  Assessment and Plan:     ICD-10-CM   1. Adhesive capsulitis of left shoulder  M75.02   2. Diabetes mellitus type 2 with peripheral artery disease (HCC)  E11.51    Doing a lot better.  ROM and  pain  are improved. F/u with me if stalls out, but in thawing phase  Follow-up: No follow-ups on file.  No orders of the defined types were placed in this encounter.  No orders of the defined types were placed in this encounter.   Signed,  Maud Deed. Jacey Eckerson, MD   Outpatient Encounter Medications as of 03/21/2019  Medication Sig  . aspirin 81 MG tablet Take 81 mg by mouth daily.    . carvedilol (COREG) 6.25 MG tablet Take 1 tablet (6.25 mg total) by mouth 2 (two) times daily.  . clopidogrel (PLAVIX) 75 MG tablet Take 1 tablet (75 mg total) by mouth daily.  . Continuous Blood Gluc Sensor (FREESTYLE LIBRE 14 DAY SENSOR) MISC 1 each by Does not apply route every 14 (fourteen) days. Change every 2 weeks  . enalapril (VASOTEC) 20 MG tablet Take 1 tablet by mouth once daily for blood pressure.  . EUTHYROX 112 MCG tablet TAKE 1 TABLET BY MOUTH IN THE MORNING ON  AN  EMPTY  STOMACH.  NO  FOOD  OR  OTHER  MEDICATIONS  FOR  30  MINUTES  . ezetimibe (ZETIA) 10 MG tablet Take 1 tablet (10 mg total) by mouth daily.  Marland Kitchen glucose blood (FREESTYLE LITE) test strip USE 1 STRIP TO CHECK BLOOD SUGAR 5 TIMES DAILY FOR UNCONTROLLED DIABETES MELLITUS  . glucose monitoring kit (FREESTYLE) monitoring kit 1 each by Does not apply route as needed for other. PHARMACY PLEASE SUBSTITUTE WHATEVER GLUCOMETER YOU CARRY  . insulin aspart (NOVOLOG FLEXPEN) 100 UNIT/ML FlexPen Inject 20 units before breakfast, lunch, dinner.  . Insulin Pen Needle (INSUPEN PEN NEEDLES) 32G X 4 MM MISC Use to inject insulin 4 times daily.  . isosorbide mononitrate (IMDUR) 30 MG 24 hr tablet Take 1 tablet (30 mg total) by mouth daily.  Marland Kitchen ketorolac (TORADOL) 10 MG tablet Take 1 tablet (10 mg total) by mouth every 8 (eight) hours.  Marland Kitchen nystatin cream (MYCOSTATIN) Apply 1 application topically 2 (two) times daily. Apply a thin layer to clean and dry skin for up to 3 weeks.  . ondansetron (ZOFRAN-ODT) 8 MG disintegrating tablet Take 1 tablet (8 mg  total) by mouth every 8 (eight) hours as needed for nausea.  . predniSONE (DELTASONE) 20 MG tablet Take 2 tablets for 3 days, then 1 tablet for 3 days.  . rosuvastatin (CRESTOR) 40 MG tablet Take 1 tablet (40 mg total) by mouth daily.  . traZODone (DESYREL) 100 MG tablet Take 1 tablet (100 mg total) by mouth at bedtime. As needed for sleep.  . [DISCONTINUED] Insulin Glargine (LANTUS SOLOSTAR) 100 UNIT/ML Solostar Pen Inject 44 Units into the skin at bedtime.  Marland Kitchen amitriptyline (ELAVIL) 50 MG tablet Take 1 tablet (50 mg total) by mouth at bedtime.  . gabapentin (NEURONTIN) 300 MG capsule Take 1 capsule (300 mg total) by mouth 3 (three) times daily.   No facility-administered encounter medications on file as of 03/21/2019.

## 2019-03-21 ENCOUNTER — Ambulatory Visit (INDEPENDENT_AMBULATORY_CARE_PROVIDER_SITE_OTHER): Payer: Medicare HMO | Admitting: Family Medicine

## 2019-03-21 ENCOUNTER — Telehealth: Payer: Self-pay | Admitting: Primary Care

## 2019-03-21 ENCOUNTER — Encounter: Payer: Self-pay | Admitting: Family Medicine

## 2019-03-21 ENCOUNTER — Other Ambulatory Visit: Payer: Self-pay

## 2019-03-21 VITALS — BP 150/78 | HR 66 | Temp 97.9°F | Ht 66.0 in | Wt 151.2 lb

## 2019-03-21 DIAGNOSIS — M7502 Adhesive capsulitis of left shoulder: Secondary | ICD-10-CM

## 2019-03-21 DIAGNOSIS — E1151 Type 2 diabetes mellitus with diabetic peripheral angiopathy without gangrene: Secondary | ICD-10-CM

## 2019-03-21 MED ORDER — LANTUS SOLOSTAR 100 UNIT/ML ~~LOC~~ SOPN: 48.0000 [IU] | PEN_INJECTOR | Freq: Every day | SUBCUTANEOUS | 5 refills | Status: DC

## 2019-03-21 NOTE — Telephone Encounter (Signed)
Please thank patient for the glucose logs. I'd like for her to increase her Lantus (night time insulin) to 48 units.  I'd like to see her back in the office for an appointment in early October, please schedule.

## 2019-03-21 NOTE — Telephone Encounter (Signed)
Spoken and notified patient of [name]'s comments. Patient verbalized understanding.  

## 2019-03-21 NOTE — Telephone Encounter (Signed)
Placed in [name]'s inbox. 

## 2019-03-21 NOTE — Telephone Encounter (Signed)
Pt dropped off blood sugar readings. Placed on cart.

## 2019-03-22 ENCOUNTER — Encounter: Payer: Self-pay | Admitting: Primary Care

## 2019-03-22 ENCOUNTER — Ambulatory Visit (INDEPENDENT_AMBULATORY_CARE_PROVIDER_SITE_OTHER)
Admission: RE | Admit: 2019-03-22 | Discharge: 2019-03-22 | Disposition: A | Discharge status: Home / Self Care | Payer: Medicare HMO | Source: Ambulatory Visit | Attending: Primary Care | Admitting: Primary Care

## 2019-03-22 ENCOUNTER — Ambulatory Visit (INDEPENDENT_AMBULATORY_CARE_PROVIDER_SITE_OTHER): Payer: Medicare HMO | Admitting: Primary Care

## 2019-03-22 DIAGNOSIS — R8290 Unspecified abnormal findings in urine: Secondary | ICD-10-CM | POA: Insufficient documentation

## 2019-03-22 DIAGNOSIS — R140 Abdominal distension (gaseous): Secondary | ICD-10-CM | POA: Insufficient documentation

## 2019-03-22 DIAGNOSIS — N949 Unspecified condition associated with female genital organs and menstrual cycle: Secondary | ICD-10-CM | POA: Diagnosis not present

## 2019-03-22 DIAGNOSIS — N9489 Other specified conditions associated with female genital organs and menstrual cycle: Secondary | ICD-10-CM

## 2019-03-22 HISTORY — DX: Unspecified condition associated with female genital organs and menstrual cycle: N94.9

## 2019-03-22 HISTORY — DX: Other specified conditions associated with female genital organs and menstrual cycle: N94.89

## 2019-03-22 LAB — POC URINALSYSI DIPSTICK (AUTOMATED)
Bilirubin, UA: NEGATIVE
Blood, UA: NEGATIVE
Glucose, UA: POSITIVE — AB
Ketones, UA: NEGATIVE
Leukocytes, UA: NEGATIVE
Nitrite, UA: NEGATIVE
Protein, UA: NEGATIVE
Spec Grav, UA: 1.01 (ref 1.010–1.025)
Urobilinogen, UA: 0.2 E.U./dL
pH, UA: 7 (ref 5.0–8.0)

## 2019-03-22 NOTE — Assessment & Plan Note (Addendum)
Noted on exam today abdominal exam otherwise unremarkable.  Check abdominal plain films. Consider CT scan if she develops abdominal pain, the distention becomes worse, she develops fevers.  She is having bowel movements, numerous bowel movements today.  Blockage unlikely.  Perhaps distention is secondary to constipation.  Await results.

## 2019-03-22 NOTE — Patient Instructions (Signed)
Complete xray(s) prior to leaving today. I will notify you of your results once received.  We will be in touch with your wet prep results and urine culture results once received.  I will be in touch with your xray result later today.  It was a pleasure to see you today!

## 2019-03-22 NOTE — Assessment & Plan Note (Signed)
Atrophy present on exam, otherwise unremarkable. Wet prep collected for further evaluation of symptoms. Urinalysis overall unremarkable, culture sent.  She was the me a picture of the cream that she has at home for which she has been applying.

## 2019-03-22 NOTE — Assessment & Plan Note (Signed)
UA today without obvious cause. Culture sent. Wet prep pending for further evaluation.

## 2019-03-22 NOTE — Progress Notes (Signed)
Subjective:    Patient ID: Sarah Payne, female    DOB: 02-11-1946, 73 y.o.   MRN: [id number]  HPI  Sarah Payne is a 73 year old female with a history of CAD, CHF, Type 2 Diabetes, Hypothyroidism, chronic pain, tobacco abuse, GERD who presents today with a chief complaint of foul smelling urine.  She also reports "rawness" to the vagina and rectum and will experience burning when wiping after urinating and having bowel movements. Also with urinary frequency, and abdominal bloating. This began about 2-3 weeks ago.   She typically has one bowel movement daily, yesterday she had 4-5 bowel movements. She has a cream that she's using at home that was prescribed several years ago with temporary improvement to her burning.  She denies vaginal bleeding, vaginal itching, vaginal discharge, hematuria, flank pain, nausea, vomiting. She has noticed a scant amount of bright red rectal bleeding on the toilet paper when wiping after a bowel movement.  She is eating and drinking well without difficulty.  Review of Systems  Constitutional: Negative for fever.  Gastrointestinal: Negative for abdominal pain, constipation, diarrhea, nausea and vomiting.  Genitourinary: Positive for frequency. Negative for vaginal bleeding and vaginal discharge.  Skin:       Burning to the skin of her vagina and rectum when wiping.       Past Medical History:  Diagnosis Date  . Cataract    BILATERAL REMOVED  . Chronic low back pain   . COPD (chronic obstructive pulmonary disease) ([place])   . Coronary artery disease    a. multiple PCIs to the mLCx. b. stent to dRCA 08/2011 in setting of NSTEMI. c. DES to prox-mid LCx for ISR 09/2011. d.  DESx2 to prox-mRCA 07/2012. e. Takotsubo event 12/2015 with patent stents.  . Depression   . Diabetes mellitus   . Diverticulosis   . Frequent PVCs    a. Noted in hospital 12/2015.  Marland Kitchen GERD (gastroesophageal reflux disease)   . Hyperlipidemia   . Hypertension   . Hypothyroidism   .  Impingement syndrome of left shoulder   . Marijuana abuse   . Myocardial infarction ([place])    x 5  . Sleep apnea    mild-does not use cpap  . Takotsubo cardiomyopathy    a. 12/2015 - nephew committed suicide 1 week prior, sister died the morning of presentation - initially called a STEMI; cath with patent stents. LVEF 25-30%.  . Tendonitis of left rotator cuff   . Tobacco abuse   . Vascular dementia      Social History   Socioeconomic History  . Marital status: Married    Spouse name: Not on file  . Number of children: Not on file  . Years of education: Not on file  . Highest education level: Not on file  Occupational History  . Not on file  Social Needs  . Financial resource strain: Not on file  . Food insecurity    Worry: Not on file    Inability: Not on file  . Transportation needs    Medical: Not on file    Non-medical: Not on file  Tobacco Use  . Smoking status: Current Every Day Smoker    Packs/day: 1.00    Years: 45.00    Pack years: 45.00    Types: Cigarettes  . Smokeless tobacco: Never Used  . Tobacco comment: Has cut back, trying to quit.   Substance and Sexual Activity  . Alcohol use: No    Alcohol/week:  0.0 standard drinks  . Drug use: Yes    Types: Marijuana    Comment: last noc  . Sexual activity: Not on file  Lifestyle  . Physical activity    Days per week: Not on file    Minutes per session: Not on file  . Stress: Not on file  Relationships  . Social Herbalist on phone: Not on file    Gets together: Not on file    Attends religious service: Not on file    Active member of club or organization: Not on file    Attends meetings of clubs or organizations: Not on file    Relationship status: Not on file  . Intimate partner violence    Fear of current or ex partner: Not on file    Emotionally abused: Not on file    Physically abused: Not on file    Forced sexual activity: Not on file  Other Topics Concern  . Not on file  Social  History Narrative   Lives at home with her husband in [place].  Previously used marijuana - quit.      Regular exercise: no/ pain from a frozen rotator cuff   Caffeine use: coffee daily and pepsi      Does not have a living will.   Daughters and husband know her wishes- would desire CPR but not prolonged life support if futile    Past Surgical History:  Procedure Laterality Date  . ABDOMINAL HYSTERECTOMY    . APPENDECTOMY    . BACK SURGERY    . CARDIAC CATHETERIZATION  2013  . CARDIAC CATHETERIZATION  2016  . CARDIAC CATHETERIZATION N/A 01/01/2016   Procedure: Left Heart Cath and Coronary Angiography;  Surgeon: Jettie Booze, MD;  Location: [facility];  Service: Cardiovascular;  Laterality: N/A;  . CATARACT EXTRACTION W/PHACO Left 01/30/2015   Procedure: CATARACT EXTRACTION PHACO AND INTRAOCULAR LENS PLACEMENT (IOC);  Surgeon: Birder Robson, MD;  Location: [facility] ORS;  Service: Ophthalmology;  Laterality: Left;  [place] 00:47   . CATARACT EXTRACTION W/PHACO Right 02/13/2015   Procedure: CATARACT EXTRACTION PHACO AND INTRAOCULAR LENS PLACEMENT (IOC);  Surgeon: Birder Robson, MD;  Location: [facility];  Service: Ophthalmology;  Laterality: Right;  cassette lot # [id number] H [place]  00:29.9 AP  20.7 CDE  6.20  . COLONOSCOPY N/A 11/02/2014   Procedure: COLONOSCOPY;  Surgeon: Inda Castle, MD;  Location: [facility];  Service: Endoscopy;  Laterality: N/A;  . CORONARY ANGIOPLASTY  2012   stent x 3   . CORONARY ANGIOPLASTY WITH STENT PLACEMENT  2013  . ESOPHAGOGASTRODUODENOSCOPY (EGD) WITH PROPOFOL N/A 09/21/2018   Procedure: ESOPHAGOGASTRODUODENOSCOPY (EGD) WITH PROPOFOL;  Surgeon: [name], MD;  Location: [facility]COPY;  Service: Gastroenterology;  Laterality: N/A;  . SHOULDER SURGERY Left 2017    Family History  Problem Relation Age of Onset  . Heart attack Mother        First MI @ 38 - Died @ 39  . Heart disease Mother   . Heart disease Father        Died @ 55  . Throat  cancer Brother   . Liver cancer Brother   . Colon cancer Sister     Allergies  Allergen Reactions  . No Known Allergies     Current Outpatient Medications on File Prior to Visit  Medication Sig Dispense Refill  . amitriptyline (ELAVIL) 50 MG tablet Take 1 tablet (50 mg total) by mouth at bedtime. 30 tablet  2  . aspirin 81 MG tablet Take 81 mg by mouth daily.      . carvedilol (COREG) 6.25 MG tablet Take 1 tablet (6.25 mg total) by mouth 2 (two) times daily. 180 tablet 3  . clopidogrel (PLAVIX) 75 MG tablet Take 1 tablet (75 mg total) by mouth daily. 90 tablet 3  . Continuous Blood Gluc Sensor (FREESTYLE LIBRE 14 DAY SENSOR) MISC 1 each by Does not apply route every 14 (fourteen) days. Change every 2 weeks 6 each 3  . enalapril (VASOTEC) 20 MG tablet Take 1 tablet by mouth once daily for blood pressure. 90 tablet 3  . EUTHYROX 112 MCG tablet TAKE 1 TABLET BY MOUTH IN THE MORNING ON  AN  EMPTY  STOMACH.  NO  FOOD  OR  OTHER  MEDICATIONS  FOR  30  MINUTES 90 tablet 1  . ezetimibe (ZETIA) 10 MG tablet Take 1 tablet (10 mg total) by mouth daily. 90 tablet 3  . gabapentin (NEURONTIN) 300 MG capsule Take 1 capsule (300 mg total) by mouth 3 (three) times daily. 90 capsule 2  . insulin aspart (NOVOLOG FLEXPEN) 100 UNIT/ML FlexPen Inject 20 units before breakfast, lunch, dinner. 30 mL 5  . Insulin Glargine (LANTUS SOLOSTAR) 100 UNIT/ML Solostar Pen Inject 48 Units into the skin at bedtime. 15 mL 5  . Insulin Pen Needle (INSUPEN PEN NEEDLES) 32G X 4 MM MISC Use to inject insulin 4 times daily. 250 each 1  . isosorbide mononitrate (IMDUR) 30 MG 24 hr tablet Take 1 tablet (30 mg total) by mouth daily. 90 tablet 3  . nystatin cream (MYCOSTATIN) Apply 1 application topically 2 (two) times daily. Apply a thin layer to clean and dry skin for up to 3 weeks. 30 g 0  . rosuvastatin (CRESTOR) 40 MG tablet Take 1 tablet (40 mg total) by mouth daily. 90 tablet 3  . traZODone (DESYREL) 100 MG tablet Take 1 tablet  (100 mg total) by mouth at bedtime. As needed for sleep. 90 tablet 1   No current facility-administered medications on file prior to visit.     There were no vitals taken for this visit.   Objective:   Physical Exam  Constitutional: She appears well-nourished.  Neck: Neck supple.  Cardiovascular: Normal rate and regular rhythm.  Respiratory: Effort normal and breath sounds normal.  GI: Soft. Bowel sounds are normal. She exhibits distension. There is no abdominal tenderness.  Skin: Skin is warm and dry.  Psychiatric: She has a normal mood and affect.           Assessment & Plan:

## 2019-03-24 LAB — WET PREP BY MOLECULAR PROBE
Candida species: NOT DETECTED
Gardnerella vaginalis: NOT DETECTED
MICRO NUMBER:: 834680
SPECIMEN QUALITY:: ADEQUATE
Trichomonas vaginosis: NOT DETECTED

## 2019-03-24 LAB — URINE CULTURE
MICRO NUMBER:: 834681
SPECIMEN QUALITY:: ADEQUATE

## 2019-04-25 ENCOUNTER — Encounter: Payer: Self-pay | Admitting: Primary Care

## 2019-04-25 ENCOUNTER — Ambulatory Visit (INDEPENDENT_AMBULATORY_CARE_PROVIDER_SITE_OTHER): Payer: Medicare HMO | Admitting: Primary Care

## 2019-04-25 ENCOUNTER — Other Ambulatory Visit: Payer: Self-pay

## 2019-04-25 VITALS — BP 136/76 | HR 82 | Temp 97.6°F | Ht 66.0 in | Wt 153.8 lb

## 2019-04-25 DIAGNOSIS — Z23 Encounter for immunization: Secondary | ICD-10-CM | POA: Diagnosis not present

## 2019-04-25 DIAGNOSIS — E1151 Type 2 diabetes mellitus with diabetic peripheral angiopathy without gangrene: Secondary | ICD-10-CM

## 2019-04-25 LAB — POCT GLYCOSYLATED HEMOGLOBIN (HGB A1C): Hemoglobin A1C: 9 % — AB (ref 4.0–5.6)

## 2019-04-25 MED ORDER — LANTUS SOLOSTAR 100 UNIT/ML ~~LOC~~ SOPN: 50.0000 [IU] | PEN_INJECTOR | Freq: Every day | SUBCUTANEOUS | 5 refills | Status: DC

## 2019-04-25 NOTE — Patient Instructions (Signed)
Continue Lantus at 50 units nightly.  Reduce your Novolog to 20 units before meals if eating.  Continue to work on [employer].  Start exercising. You should be getting 150 minutes of exercise weekly.  Please schedule a follow up appointment in 3 months for diabetes check.  It was a pleasure to see you today!

## 2019-04-25 NOTE — Addendum Note (Signed)
Addended by: Jacqualin Combes on: 04/25/2019 02:37 PM   Modules accepted: Orders

## 2019-04-25 NOTE — Assessment & Plan Note (Signed)
A1C today at 9.0 which has improved since her last check. Commended her on dietary changes, encouraged exercise.  She does have some readings below 60 before meals, discussed to reduce Novolog to 20 units before meals when eating. Also discussed to notify me if she sees lows below 70, 2 hours after eating.  Continue Lantus 50 units HS.  Follow up in 3 months.

## 2019-04-25 NOTE — Progress Notes (Signed)
Subjective:    Patient ID: Sarah Payne, female    DOB: Nov 22, 1945, 73 y.o.   MRN: [id number]  HPI  Ms. Janvier is a 73 year old female who presents today for follow up of type 2 diabetes.  Current medications include: Lantus 50 units HS, Novolog 22 units TID with meals. Mostly injecting Novolog 22 units BID as she doesn't eat breakfast.   She is checking her blood glucose 3-4 times daily and is getting readings of:  AM fasting low to high 100's.  2 hours after breakfast: 78, 176, 251, 182, 241, 87 2 hours after lunch: 178, 59, 111, 98, 82, 244 Before dinner: 211, 94, 71, 66, 82, 218, 118, 129, 53, 109 2 hours after dinner: 120, 128, 255, 123, 92  Last A1C: 10.3 in June 2020 Last Eye Exam:  No recent exam Last Foot Exam: Completed in March 2020 Pneumonia Vaccination: Completed in 2015 ACE/ARB: Enalapril  Statin: Crestor  Since her last visit she is only doing zero calorie sodas. She is working on eating a diet with more protein and vegetables.   BP Readings from Last 3 Encounters:  04/25/19 136/76  03/21/19 (!) 150/78  01/24/19 140/82      Review of Systems  Eyes: Negative for visual disturbance.  Respiratory: Negative for shortness of breath.   Cardiovascular: Negative for chest pain.  Neurological: Negative for dizziness and headaches.       Past Medical History:  Diagnosis Date  . Cataract    BILATERAL REMOVED  . Chronic low back pain   . COPD (chronic obstructive pulmonary disease) ([place])   . Coronary artery disease    a. multiple PCIs to the mLCx. b. stent to dRCA 08/2011 in setting of NSTEMI. c. DES to prox-mid LCx for ISR 09/2011. d.  DESx2 to prox-mRCA 07/2012. e. Takotsubo event 12/2015 with patent stents.  . Depression   . Diabetes mellitus   . Diverticulosis   . Frequent PVCs    a. Noted in hospital 12/2015.  Marland Kitchen GERD (gastroesophageal reflux disease)   . Hyperlipidemia   . Hypertension   . Hypothyroidism   . Impingement syndrome of left shoulder   .  Marijuana abuse   . Myocardial infarction ([place])    x 5  . Sleep apnea    mild-does not use cpap  . Takotsubo cardiomyopathy    a. 12/2015 - nephew committed suicide 1 week prior, sister died the morning of presentation - initially called a STEMI; cath with patent stents. LVEF 25-30%.  . Tendonitis of left rotator cuff   . Tobacco abuse   . Vascular dementia      Social History   Socioeconomic History  . Marital status: Married    Spouse name: Not on file  . Number of children: Not on file  . Years of education: Not on file  . Highest education level: Not on file  Occupational History  . Not on file  Social Needs  . Financial resource strain: Not on file  . Food insecurity    Worry: Not on file    Inability: Not on file  . Transportation needs    Medical: Not on file    Non-medical: Not on file  Tobacco Use  . Smoking status: Current Every Day Smoker    Packs/day: 1.00    Years: 45.00    Pack years: 45.00    Types: Cigarettes  . Smokeless tobacco: Never Used  . Tobacco comment: Has cut back, trying to  quit.   Substance and Sexual Activity  . Alcohol use: No    Alcohol/week: 0.0 standard drinks  . Drug use: Yes    Types: Marijuana    Comment: last noc  . Sexual activity: Not on file  Lifestyle  . Physical activity    Days per week: Not on file    Minutes per session: Not on file  . Stress: Not on file  Relationships  . Social Herbalist on phone: Not on file    Gets together: Not on file    Attends religious service: Not on file    Active member of club or organization: Not on file    Attends meetings of clubs or organizations: Not on file    Relationship status: Not on file  . Intimate partner violence    Fear of current or ex partner: Not on file    Emotionally abused: Not on file    Physically abused: Not on file    Forced sexual activity: Not on file  Other Topics Concern  . Not on file  Social History Narrative   Lives at home with her  husband in [place].  Previously used marijuana - quit.      Regular exercise: no/ pain from a frozen rotator cuff   Caffeine use: coffee daily and pepsi      Does not have a living will.   Daughters and husband know her wishes- would desire CPR but not prolonged life support if futile    Past Surgical History:  Procedure Laterality Date  . ABDOMINAL HYSTERECTOMY    . APPENDECTOMY    . BACK SURGERY    . CARDIAC CATHETERIZATION  2013  . CARDIAC CATHETERIZATION  2016  . CARDIAC CATHETERIZATION N/A 01/01/2016   Procedure: Left Heart Cath and Coronary Angiography;  Surgeon: Jettie Booze, MD;  Location: [facility];  Service: Cardiovascular;  Laterality: N/A;  . CATARACT EXTRACTION W/PHACO Left 01/30/2015   Procedure: CATARACT EXTRACTION PHACO AND INTRAOCULAR LENS PLACEMENT (IOC);  Surgeon: Birder Robson, MD;  Location: [facility];  Service: Ophthalmology;  Laterality: Left;  [place] 00:47   . CATARACT EXTRACTION W/PHACO Right 02/13/2015   Procedure: CATARACT EXTRACTION PHACO AND INTRAOCULAR LENS PLACEMENT (IOC);  Surgeon: Birder Robson, MD;  Location: [facility];  Service: Ophthalmology;  Laterality: Right;  cassette lot # [id number] H [id number]  00:29.9 AP  20.7 CDE  6.20  . COLONOSCOPY N/A 11/02/2014   Procedure: COLONOSCOPY;  Surgeon: Inda Castle, MD;  Location: [facility];  Service: Endoscopy;  Laterality: N/A;  . CORONARY ANGIOPLASTY  2012   stent x 3   . CORONARY ANGIOPLASTY WITH STENT PLACEMENT  2013  . ESOPHAGOGASTRODUODENOSCOPY (EGD) WITH PROPOFOL N/A 09/21/2018   Procedure: ESOPHAGOGASTRODUODENOSCOPY (EGD) WITH PROPOFOL;  Surgeon: [name], MD;  Location: [facility];  Service: Gastroenterology;  Laterality: N/A;  . SHOULDER SURGERY Left 2017    Family History  Problem Relation Age of Onset  . Heart attack Mother        First MI @ 26 - Died @ 66  . Heart disease Mother   . Heart disease Father        Died @ 66  . Throat cancer Brother   . Liver cancer Brother    . Colon cancer Sister     Allergies  Allergen Reactions  . No Known Allergies     Current Outpatient Medications on File Prior to Visit  Medication Sig Dispense Refill  . amitriptyline (  ELAVIL) 50 MG tablet Take 1 tablet (50 mg total) by mouth at bedtime. 30 tablet 2  . aspirin 81 MG tablet Take 81 mg by mouth daily.      . carvedilol (COREG) 6.25 MG tablet Take 1 tablet (6.25 mg total) by mouth 2 (two) times daily. 180 tablet 3  . clopidogrel (PLAVIX) 75 MG tablet Take 1 tablet (75 mg total) by mouth daily. 90 tablet 3  . Continuous Blood Gluc Sensor (FREESTYLE LIBRE 14 DAY SENSOR) MISC 1 each by Does not apply route every 14 (fourteen) days. Change every 2 weeks 6 each 3  . enalapril (VASOTEC) 20 MG tablet Take 1 tablet by mouth once daily for blood pressure. 90 tablet 3  . EUTHYROX 112 MCG tablet TAKE 1 TABLET BY MOUTH IN THE MORNING ON  AN  EMPTY  STOMACH.  NO  FOOD  OR  OTHER  MEDICATIONS  FOR  30  MINUTES 90 tablet 1  . ezetimibe (ZETIA) 10 MG tablet Take 1 tablet (10 mg total) by mouth daily. 90 tablet 3  . insulin aspart (NOVOLOG FLEXPEN) 100 UNIT/ML FlexPen Inject 20 units before breakfast, lunch, dinner. 30 mL 5  . Insulin Glargine (LANTUS SOLOSTAR) 100 UNIT/ML Solostar Pen Inject 48 Units into the skin at bedtime. 15 mL 5  . Insulin Pen Needle (INSUPEN PEN NEEDLES) 32G X 4 MM MISC Use to inject insulin 4 times daily. 250 each 1  . isosorbide mononitrate (IMDUR) 30 MG 24 hr tablet Take 1 tablet (30 mg total) by mouth daily. 90 tablet 3  . nystatin cream (MYCOSTATIN) Apply 1 application topically 2 (two) times daily. Apply a thin layer to clean and dry skin for up to 3 weeks. 30 g 0  . rosuvastatin (CRESTOR) 40 MG tablet Take 1 tablet (40 mg total) by mouth daily. 90 tablet 3  . traZODone (DESYREL) 100 MG tablet Take 1 tablet (100 mg total) by mouth at bedtime. As needed for sleep. 90 tablet 1  . gabapentin (NEURONTIN) 300 MG capsule Take 1 capsule (300 mg total) by mouth 3 (three)  times daily. 90 capsule 2   No current facility-administered medications on file prior to visit.     BP 136/76   Pulse 82   Temp 97.6 F (36.4 C) (Temporal)   Ht 5\' 6"  (1.676 m)   Wt 153 lb 12 oz (69.7 kg)   SpO2 98%   BMI 24.82 kg/m    Objective:   Physical Exam  Constitutional: She appears well-nourished.  Neck: Neck supple.  Cardiovascular: Normal rate and regular rhythm.  Respiratory: Effort normal and breath sounds normal.  Skin: Skin is warm and dry.  Psychiatric: She has a normal mood and affect.           Assessment & Plan:

## 2019-05-02 ENCOUNTER — Telehealth: Payer: Self-pay | Admitting: *Deleted

## 2019-05-02 NOTE — Telephone Encounter (Signed)
Attempted to call for pre appointment review of meds/allergies. No answer, no answering maching.

## 2019-05-03 ENCOUNTER — Encounter: Payer: Self-pay | Admitting: Student in an Organized Health Care Education/Training Program

## 2019-05-03 ENCOUNTER — Other Ambulatory Visit: Payer: Self-pay

## 2019-05-03 ENCOUNTER — Ambulatory Visit
Payer: Medicare HMO | Attending: Student in an Organized Health Care Education/Training Program | Admitting: Student in an Organized Health Care Education/Training Program

## 2019-05-03 ENCOUNTER — Telehealth: Payer: Self-pay | Admitting: *Deleted

## 2019-05-03 DIAGNOSIS — M47812 Spondylosis without myelopathy or radiculopathy, cervical region: Secondary | ICD-10-CM

## 2019-05-03 DIAGNOSIS — G894 Chronic pain syndrome: Secondary | ICD-10-CM | POA: Diagnosis not present

## 2019-05-03 DIAGNOSIS — M4716 Other spondylosis with myelopathy, lumbar region: Secondary | ICD-10-CM | POA: Diagnosis not present

## 2019-05-03 DIAGNOSIS — G629 Polyneuropathy, unspecified: Secondary | ICD-10-CM | POA: Diagnosis not present

## 2019-05-03 DIAGNOSIS — M7918 Myalgia, other site: Secondary | ICD-10-CM | POA: Diagnosis not present

## 2019-05-03 DIAGNOSIS — M5136 Other intervertebral disc degeneration, lumbar region: Secondary | ICD-10-CM

## 2019-05-03 DIAGNOSIS — G8929 Other chronic pain: Secondary | ICD-10-CM | POA: Diagnosis not present

## 2019-05-03 MED ORDER — GABAPENTIN 300 MG PO CAPS: 300.0000 mg | ORAL_CAPSULE | Freq: Three times a day (TID) | ORAL | 5 refills | Status: DC

## 2019-05-03 NOTE — Progress Notes (Signed)
Pain Management Virtual Encounter Note - Virtual Visit via Telephone Telehealth (real-time audio visits between healthcare provider and patient).   Patient's Phone No. & Preferred Pharmacy:  [phone] (home); There is no such number on file (mobile).; (Preferred) [phone] [email or web address]  [facility] - [place] [place] [address] Phone: [phone] Fax: [phone]  [facility] - [address] [place] [address] Phone: [phone] Fax: [phone]  [facility] [address], [place] - [place] [address] Phone: [phone] Fax: [phone]    Pre-screening note:  Our staff contacted Ms. [name] and offered her an "in person", "face-to-face" appointment versus a telephone encounter. She indicated preferring the telephone encounter, at this time.   Reason for Virtual Visit: COVID-19*  Social distancing based on CDC and AMA recommendations.   I contacted Sarah Payne on 05/03/2019 via telephone.      I clearly identified myself as Gillis Santa, MD. I verified that I was speaking with the correct person using two identifiers (Name: Sarah Payne, and date of birth: Oct 19, 1945).  Advanced Informed Consent I sought verbal advanced consent from Sarah Payne for virtual visit interactions. I informed Sarah Payne of possible security and privacy concerns, risks, and limitations associated with providing "not-in-person" medical evaluation and management services. I also informed Sarah Payne of the availability of "in-person" appointments. Finally, I informed her that there would be a charge for the virtual visit and that she could be  personally, fully or partially, financially responsible for it. Sarah Payne expressed understanding and agreed to proceed.   Historic Elements   Sarah Payne is a 73 y.o. year old, female  patient evaluated today after her last encounter by our practice on 05/02/2019. Sarah Payne  has a past medical history of Cataract, Chronic low back pain, COPD (chronic obstructive pulmonary disease) ([place]), Coronary artery disease, Depression, Diabetes mellitus, Diverticulosis, Frequent PVCs, GERD (gastroesophageal reflux disease), Hyperlipidemia, Hypertension, Hypothyroidism, Impingement syndrome of left shoulder, Marijuana abuse, Myocardial infarction [facility]), Sleep apnea, Takotsubo cardiomyopathy, Tendonitis of left rotator cuff, Tobacco abuse, and Vascular dementia. She also  has a past surgical history that includes Back surgery; Appendectomy; Abdominal hysterectomy; Colonoscopy (N/A, 11/02/2014); Cataract extraction w/PHACO (Left, 01/30/2015); Cardiac catheterization (2013); Coronary angioplasty (2012); Coronary angioplasty with stent (2013); Cardiac catheterization (2016); Cataract extraction w/PHACO (Right, 02/13/2015); Cardiac catheterization (N/A, 01/01/2016); Esophagogastroduodenoscopy (egd) with propofol (N/A, 09/21/2018); and Shoulder surgery (Left, 2017). Sarah Payne has a current medication list which includes the following prescription(s): amitriptyline, aspirin, carvedilol, clopidogrel, freestyle libre 14 day sensor, enalapril, euthyrox, ezetimibe, gabapentin, novolog flexpen, lantus solostar, insulin pen needle, isosorbide mononitrate, nystatin cream, rosuvastatin, and trazodone. She  reports that she has been smoking cigarettes. She has a 45.00 pack-year smoking history. She has never used smokeless tobacco. She reports current drug use. Drug: Marijuana. She reports that she does not drink alcohol. Sarah Payne is allergic to no known allergies.   HPI  Today, she is being contacted for medication management.   No change in medical history since last visit.  Patient's pain is at baseline.  Patient continues multimodal pain regimen as prescribed.  States that it provides pain relief and improvement in  functional status.  Refill of Gabapentin as below, continues Amitriptyline 50 mg qhs (does not need refill on this).   Laboratory Chemistry Profile (12 mo)  Renal: 01/17/2019: BUN 16; Creatinine, Ser 0.92  Lab Results  Component Value Date   GFR 60.20 01/17/2019   GFRAA 54 (L) 08/24/2018   GFRNONAA 47 (L) 08/24/2018   Hepatic: 12/24/2018: Albumin 4.3 Lab Results  Component Value Date   AST 12 12/24/2018   ALT 10 12/24/2018   Other: No results found for requested labs within last 8760 hours. Note: Above Lab results reviewed.  Imaging  Last 90 days:  Dg Abd 2 Views  Result Date: 03/22/2019 CLINICAL DATA:  Abdominal bloating and distention. EXAM: ABDOMEN - 2 VIEW COMPARISON:  Abdominal x-ray dated April 04, 2016. FINDINGS: The bowel gas pattern is normal. There is no evidence of free air. No large colonic stool burden. No radio-opaque calculi or other significant radiographic abnormality is seen. Calcifications projecting over the right kidney are vascular as demonstrated on prior CTs. No acute osseous abnormality. Prior L3-S1 PLIF with unchanged loosening of the left S1 pedicle screw. Severe degenerative disc disease at L1-L2 and L2-L3. L1-L2 spondylosis has progressed since 2017. IMPRESSION: No acute findings. Electronically Signed   By: Titus Dubin M.D.   On: 03/22/2019 17:57    Assessment  The primary encounter diagnosis was Chronic pain syndrome. Diagnoses of Peripheral polyneuropathy, Lumbar spondylosis with myelopathy, Lumbar degenerative disc disease, Chronic musculoskeletal pain, and Cervical spondylosis were also pertinent to this visit.  Plan of Care  I am having Sarah Payne maintain her aspirin, Insulin Pen Needle, nystatin cream, clopidogrel, enalapril, ezetimibe, isosorbide mononitrate, rosuvastatin, carvedilol, FreeStyle Libre 14 Day Sensor, amitriptyline, traZODone, NovoLOG FlexPen, Euthyrox, Lantus SoloStar, and gabapentin.  Pharmacotherapy (Medications  Ordered): Meds ordered this encounter  Medications  . gabapentin (NEURONTIN) 300 MG capsule    Sig: Take 1 capsule (300 mg total) by mouth 3 (three) times daily.    Dispense:  90 capsule    Refill:  5    Follow-up plan:   Return in about 6 months (around 11/01/2019) for Medication Management.    Recent Visits No visits were found meeting these conditions.  Showing recent visits within past 90 days and meeting all other requirements   Today's Visits Date Type Provider Dept  05/03/19 Office Visit Gillis Santa, MD [facility]  Showing today's visits and meeting all other requirements   Future Appointments No visits were found meeting these conditions.  Showing future appointments within next 90 days and meeting all other requirements   I discussed the assessment and treatment plan with the patient. The patient was provided an opportunity to ask questions and all were answered. The patient agreed with the plan and demonstrated an understanding of the instructions.  Patient advised to call back or seek an in-person evaluation if the symptoms or condition worsens.  Total duration of non-face-to-face encounter:86minutes.  Note by: Gillis Santa, MD Date: 05/03/2019; Time: 11:43 AM  Note: This dictation was prepared with Dragon dictation. Any transcriptional errors that may result from this process are unintentional.  Disclaimer:  * Given the special circumstances of the COVID-19 pandemic, the federal government has announced that the Office for Civil Rights (OCR) will exercise its enforcement discretion and will not impose penalties on physicians using telehealth in the event of noncompliance with regulatory requirements under the [place] (HIPAA) in connection with the good faith provision of telehealth during the XX123456 national public health emergency. ([place])

## 2019-05-04 ENCOUNTER — Telehealth: Payer: Self-pay | Admitting: *Deleted

## 2019-05-04 DIAGNOSIS — G629 Polyneuropathy, unspecified: Secondary | ICD-10-CM

## 2019-05-05 NOTE — Telephone Encounter (Signed)
Spoke with patient and informed her that Gabapentin was sent to Mail order pharmacy.

## 2019-05-05 NOTE — Telephone Encounter (Signed)
Dr Holley Raring, this patient is out of her Gabapentin and it was sent to mail order and should have been sent to [facility] according to patient.  Patient states she is going out of town and cant wait for it to come via mail.

## 2019-05-09 MED ORDER — GABAPENTIN 300 MG PO CAPS: 300.0000 mg | ORAL_CAPSULE | Freq: Three times a day (TID) | ORAL | 5 refills | Status: DC

## 2019-05-09 NOTE — Telephone Encounter (Signed)
Requested Prescriptions   Signed Prescriptions Disp Refills  . gabapentin (NEURONTIN) 300 MG capsule 90 capsule 5    Sig: Take 1 capsule (300 mg total) by mouth 3 (three) times daily.    Authorizing Provider: Denese Killings to [name], please call and inform patient

## 2019-05-09 NOTE — Telephone Encounter (Signed)
Patient informed. 

## 2019-07-05 ENCOUNTER — Other Ambulatory Visit: Payer: Self-pay | Admitting: Primary Care

## 2019-07-05 DIAGNOSIS — F5101 Primary insomnia: Secondary | ICD-10-CM

## 2019-08-25 ENCOUNTER — Other Ambulatory Visit: Payer: Self-pay | Admitting: Primary Care

## 2019-08-25 DIAGNOSIS — E039 Hypothyroidism, unspecified: Secondary | ICD-10-CM

## 2019-08-25 NOTE — Progress Notes (Deleted)
Office Visit    Patient Name: Sarah Payne Date of Encounter: 08/25/2019  Primary Care Provider:  [name], NP Primary Cardiologist:  Ida Rogue, MD Electrophysiologist:  None   Chief Complaint    Sarah Payne is a 74 y.o. female with a hx of CAD, tobacco abuse, chronic pain syndrome, DM2, depression, HTN presents today for follow-up of her CAD  Past Medical History    Past Medical History:  Diagnosis Date  . Cataract    BILATERAL REMOVED  . Chronic low back pain   . COPD (chronic obstructive pulmonary disease) ([place])   . Coronary artery disease    a. multiple PCIs to the mLCx. b. stent to dRCA 08/2011 in setting of NSTEMI. c. DES to prox-mid LCx for ISR 09/2011. d.  DESx2 to prox-mRCA 07/2012. e. Takotsubo event 12/2015 with patent stents.  . Depression   . Diabetes mellitus   . Diverticulosis   . Frequent PVCs    a. Noted in hospital 12/2015.  Marland Kitchen GERD (gastroesophageal reflux disease)   . Hyperlipidemia   . Hypertension   . Hypothyroidism   . Impingement syndrome of left shoulder   . Marijuana abuse   . Myocardial infarction ([place])    x 5  . Sleep apnea    mild-does not use cpap  . Takotsubo cardiomyopathy    a. 12/2015 - nephew committed suicide 1 week prior, sister died the morning of presentation - initially called a STEMI; cath with patent stents. LVEF 25-30%.  . Tendonitis of left rotator cuff   . Tobacco abuse   . Vascular dementia    Past Surgical History:  Procedure Laterality Date  . ABDOMINAL HYSTERECTOMY    . APPENDECTOMY    . BACK SURGERY    . CARDIAC CATHETERIZATION  2013  . CARDIAC CATHETERIZATION  2016  . CARDIAC CATHETERIZATION N/A 01/01/2016   Procedure: Left Heart Cath and Coronary Angiography;  Surgeon: Jettie Booze, MD;  Location: [facility];  Service: Cardiovascular;  Laterality: N/A;  . CATARACT EXTRACTION W/PHACO Left 01/30/2015   Procedure: CATARACT EXTRACTION PHACO AND INTRAOCULAR LENS PLACEMENT (IOC);  Surgeon:  Birder Robson, MD;  Location: [facility];  Service: Ophthalmology;  Laterality: Left;  [place] 00:47   . CATARACT EXTRACTION W/PHACO Right 02/13/2015   Procedure: CATARACT EXTRACTION PHACO AND INTRAOCULAR LENS PLACEMENT (IOC);  Surgeon: Birder Robson, MD;  Location: [facility];  Service: Ophthalmology;  Laterality: Right;  cassette lot # [id number] H [place]  00:29.9 AP  20.7 CDE  6.20  . COLONOSCOPY N/A 11/02/2014   Procedure: COLONOSCOPY;  Surgeon: Inda Castle, MD;  Location: [facility];  Service: Endoscopy;  Laterality: N/A;  . CORONARY ANGIOPLASTY  2012   stent x 3   . CORONARY ANGIOPLASTY WITH STENT PLACEMENT  2013  . ESOPHAGOGASTRODUODENOSCOPY (EGD) WITH PROPOFOL N/A 09/21/2018   Procedure: ESOPHAGOGASTRODUODENOSCOPY (EGD) WITH PROPOFOL;  Surgeon: [name], MD;  Location: [facility];  Service: Gastroenterology;  Laterality: N/A;  . SHOULDER SURGERY Left 2017    Allergies  Allergies  Allergen Reactions  . No Known Allergies     History of Present Illness    Sarah Payne is a 74 y.o. female with a hx of CAD (s/p DEX x3 to RCA and DESx1 LCx), tobacco abuse, chronic pain syndrome, DM2, depression, HTN, COPD last seen 04/2018 by Dr. Rockey Situ.  Her CAD is s/p DES x1 to mid left circumflex and to RCA x3 with moderate to severe LAD disease.  08/2011 distal RCA 95% stenosed, DES stent placed. 09/2011 prox circumflex with in-stent restenosis treated with DES to proximal circumflex. 2014 cath moderate mid LAD disease at take off of diagonal vessel. Moderate to severe RCA disease from ostium to mid RCA. 60-70% mid RCA stenosis prior to previously placed distal stent. LCx stent patent.  FFR of the LAD was 1 (not significnat). FFR of mid RCA was flow-limiting so DESx2 placed to mid RCA and proximal RCA. STEMI 01/01/2016 with cath showing patent stents, felt to be stress related cardiomyopathy. Echo 12/2015 EF 25-30%. Follow up echo 03/2016 normal LVEF.     EKGs/Labs/Other Studies Reviewed:   The  following studies were reviewed today:  Cardiac cath 12/2015  Patent stents in the circumflex and RCA.  There is severe left ventricular systolic dysfunction in a pattern of Takotsubo cardiomyopathy.   Medical therapy.  Echo 03/2016 Left ventricle: The cavity size was normal. Wall thickness was    normal. Systolic function was normal. The estimated ejection    fraction was in the range of 60% to 65%. Wall motion was normal;    there were no regional wall motion abnormalities. Doppler    parameters are consistent with abnormal left ventricular    relaxation (grade 1 diastolic dysfunction).  - Left atrium: The atrium was normal in size.  - Pulmonary arteries: Systolic pressure could not be accurately    estimated.  - Pericardium, extracardiac: A trivial pericardial effusion was    identified.   Carotid duplex 11/2016 Bilateral carotic with <39% stenosis EKG:  EKG is *** ordered today.  The ekg ordered today demonstrates ***  Recent Labs: 12/24/2018: ALT 10 01/17/2019: BUN 16; Creatinine, Ser 0.92; Potassium 4.2; Sodium 137; TSH 1.43  Recent Lipid Panel    Component Value Date/Time   CHOL 219 (H) 12/24/2018 1227   CHOL 292 (H) 08/02/2014 0408   TRIG 307.0 (H) 12/24/2018 1227   TRIG 388 (H) 08/02/2014 0408   HDL 38.50 (L) 12/24/2018 1227   HDL 34 (L) 08/02/2014 0408   CHOLHDL 6 12/24/2018 1227   VLDL 61.4 (H) 12/24/2018 1227   VLDL 78 (H) 08/02/2014 0408   LDLCALC 146 (H) 03/12/2015 1136   LDLCALC 180 (H) 08/02/2014 0408   LDLDIRECT 143.0 12/24/2018 1227    Home Medications   No outpatient medications have been marked as taking for the 08/29/19 encounter (Appointment) with Loel Dubonnet, NP.      Review of Systems    ***   ROS All other systems reviewed and are otherwise negative except as noted above.  Physical Exam    VS:  There were no vitals taken for this visit. , BMI There is no height or weight on file to calculate BMI. GEN: Well nourished, well developed,  in no acute distress. HEENT: normal. Neck: Supple, no JVD, carotid bruits, or masses. Cardiac: ***RRR, no murmurs, rubs, or gallops. No clubbing, cyanosis, edema.  ***Radials/DP/PT 2+ and equal bilaterally.  Respiratory:  ***Respirations regular and unlabored, clear to auscultation bilaterally. GI: Soft, nontender, nondistended, BS + x 4. MS: No deformity or atrophy. Skin: Warm and dry, no rash. Neuro:  Strength and sensation are intact. Psych: Normal affect.  Accessory Clinical Findings    ECG personally reviewed by me today - *** - no acute changes.  Assessment & Plan    1. CAD -  2. HTN -  3. HLD - Lipid profile 12/2018 with total cholesterol 219, HDL 38.5, LDL 143.  4. Tobacco abuse - Smoking  cessation encouraged. Recommend utilization of [id number]. 5. COPD -  6. DM2 - 04/25/19 A1c 9.0.   Disposition: Follow up {follow up:15908} with ***   Loel Dubonnet, NP 08/25/2019, 7:54 AM

## 2019-08-29 ENCOUNTER — Encounter: Payer: Self-pay | Admitting: Family

## 2019-08-29 ENCOUNTER — Encounter: Payer: Self-pay | Admitting: Student in an Organized Health Care Education/Training Program

## 2019-08-29 ENCOUNTER — Ambulatory Visit: Payer: Medicare HMO | Admitting: Family

## 2019-08-29 ENCOUNTER — Ambulatory Visit (INDEPENDENT_AMBULATORY_CARE_PROVIDER_SITE_OTHER): Payer: Medicare PPO | Admitting: Family

## 2019-08-29 ENCOUNTER — Other Ambulatory Visit: Payer: Self-pay

## 2019-08-29 VITALS — BP 130/80 | HR 72 | Ht 66.0 in | Wt 155.0 lb

## 2019-08-29 DIAGNOSIS — E785 Hyperlipidemia, unspecified: Secondary | ICD-10-CM

## 2019-08-29 DIAGNOSIS — I739 Peripheral vascular disease, unspecified: Secondary | ICD-10-CM

## 2019-08-29 DIAGNOSIS — J449 Chronic obstructive pulmonary disease, unspecified: Secondary | ICD-10-CM | POA: Diagnosis not present

## 2019-08-29 DIAGNOSIS — I25118 Atherosclerotic heart disease of native coronary artery with other forms of angina pectoris: Secondary | ICD-10-CM | POA: Diagnosis not present

## 2019-08-29 DIAGNOSIS — Z720 Tobacco use: Secondary | ICD-10-CM

## 2019-08-29 DIAGNOSIS — E782 Mixed hyperlipidemia: Secondary | ICD-10-CM | POA: Diagnosis not present

## 2019-08-29 DIAGNOSIS — I5022 Chronic systolic (congestive) heart failure: Secondary | ICD-10-CM

## 2019-08-29 MED ORDER — CHANTIX STARTING MONTH PAK 0.5 MG X 11 & 1 MG X 42 PO TABS: ORAL_TABLET | ORAL | 0 refills | Status: DC

## 2019-08-29 MED ORDER — VARENICLINE TARTRATE 1 MG PO TABS: 1.0000 mg | ORAL_TABLET | Freq: Two times a day (BID) | ORAL | 1 refills | Status: DC

## 2019-08-29 NOTE — Progress Notes (Signed)
Office Visit    Patient Name: Sarah Payne Date of Encounter: 08/29/2019  Primary Care Provider:  [name], NP Primary Cardiologist:  Ida Rogue, MD Electrophysiologist:  None   Chief Complaint    Sarah Payne is a 74 y.o. female with a hx of CAD, COPD, HLD, stress cardiomyopathy presents today for follow up of CAD   Past Medical History    Past Medical History:  Diagnosis Date  . Cataract    BILATERAL REMOVED  . Chronic low back pain   . COPD (chronic obstructive pulmonary disease) ([place])   . Coronary artery disease    a. multiple PCIs to the mLCx. b. stent to dRCA 08/2011 in setting of NSTEMI. c. DES to prox-mid LCx for ISR 09/2011. d.  DESx2 to prox-mRCA 07/2012. e. Takotsubo event 12/2015 with patent stents.  . Depression   . Diabetes mellitus   . Diverticulosis   . Frequent PVCs    a. Noted in hospital 12/2015.  Marland Kitchen GERD (gastroesophageal reflux disease)   . Hyperlipidemia   . Hypertension   . Hypothyroidism   . Impingement syndrome of left shoulder   . Marijuana abuse   . Myocardial infarction ([place])    x 5  . Sleep apnea    mild-does not use cpap  . Takotsubo cardiomyopathy    a. 12/2015 - nephew committed suicide 1 week prior, sister died the morning of presentation - initially called a STEMI; cath with patent stents. LVEF 25-30%.  . Tendonitis of left rotator cuff   . Tobacco abuse   . Vascular dementia    Past Surgical History:  Procedure Laterality Date  . ABDOMINAL HYSTERECTOMY    . APPENDECTOMY    . BACK SURGERY    . CARDIAC CATHETERIZATION  2013  . CARDIAC CATHETERIZATION  2016  . CARDIAC CATHETERIZATION N/A 01/01/2016   Procedure: Left Heart Cath and Coronary Angiography;  Surgeon: Jettie Booze, MD;  Location: [facility];  Service: Cardiovascular;  Laterality: N/A;  . CATARACT EXTRACTION W/PHACO Left 01/30/2015   Procedure: CATARACT EXTRACTION PHACO AND INTRAOCULAR LENS PLACEMENT (IOC);  Surgeon: Birder Robson, MD;   Location: [facility];  Service: Ophthalmology;  Laterality: Left;  [place] 00:47   . CATARACT EXTRACTION W/PHACO Right 02/13/2015   Procedure: CATARACT EXTRACTION PHACO AND INTRAOCULAR LENS PLACEMENT (IOC);  Surgeon: Birder Robson, MD;  Location: [facility];  Service: Ophthalmology;  Laterality: Right;  cassette lot # [id number] H [place]  00:29.9 AP  20.7 CDE  6.20  . COLONOSCOPY N/A 11/02/2014   Procedure: COLONOSCOPY;  Surgeon: Inda Castle, MD;  Location: [facility];  Service: Endoscopy;  Laterality: N/A;  . CORONARY ANGIOPLASTY  2012   stent x 3   . CORONARY ANGIOPLASTY WITH STENT PLACEMENT  2013  . ESOPHAGOGASTRODUODENOSCOPY (EGD) WITH PROPOFOL N/A 09/21/2018   Procedure: ESOPHAGOGASTRODUODENOSCOPY (EGD) WITH PROPOFOL;  Surgeon: [name], MD;  Location: [facility];  Service: Gastroenterology;  Laterality: N/A;  . SHOULDER SURGERY Left 2017    Allergies  Allergies  Allergen Reactions  . No Known Allergies     History of Present Illness    Sarah Payne is a 74 y.o. female with a hx of CAD, COPD, HLD, DM2, tobacco abuse, stress cardiomyopathy, depression last seen 05/17/2018 by Dr. Rockey Situ.  Reports feeling overall.  Tells me she has been very careful in the setting of the coronavirus pandemic.  She has taken up "dot painting" at home to stay busy.   Reports no  chest pain, pressure, tightness.  Reports no shortness of breath at rest.  Reports her dyspnea on exertion is stable and she attributes it mostly to her COPD.  Reports her chronic cough with COPD is stable.  Does endorse she has some pain in the bottom of her right foot with walking.  Gets better with rest.  Tells me this is has been ongoing for a few months but appears to be getting more bothersome.  She has not tried medication.  She is on gabapentin for her chronic back pain and reports her symptoms in her foot did not change.  No orthopnea, PND, edema.  She is very interested in quitting smoking and wants to utilize Chantix.    EKGs/Labs/Other Studies Reviewed:   The following studies were reviewed today:  EKG:  EKG is ordered today.  The ekg ordered today demonstrates SR 72 bpm with PVC and no acute ST/T wave inversion.   Recent Labs: 12/24/2018: ALT 10 01/17/2019: BUN 16; Creatinine, Ser 0.92; Potassium 4.2; Sodium 137; TSH 1.43  Recent Lipid Panel    Component Value Date/Time   CHOL 219 (H) 12/24/2018 1227   CHOL 292 (H) 08/02/2014 0408   TRIG 307.0 (H) 12/24/2018 1227   TRIG 388 (H) 08/02/2014 0408   HDL 38.50 (L) 12/24/2018 1227   HDL 34 (L) 08/02/2014 0408   CHOLHDL 6 12/24/2018 1227   VLDL 61.4 (H) 12/24/2018 1227   VLDL 78 (H) 08/02/2014 0408   LDLCALC 146 (H) 03/12/2015 1136   LDLCALC 180 (H) 08/02/2014 0408   LDLDIRECT 143.0 12/24/2018 1227    Home Medications   Current Meds  Medication Sig  . amitriptyline (ELAVIL) 50 MG tablet Take 1 tablet (50 mg total) by mouth at bedtime.  Marland Kitchen aspirin 81 MG tablet Take 81 mg by mouth daily.    . carvedilol (COREG) 6.25 MG tablet Take 1 tablet (6.25 mg total) by mouth 2 (two) times daily.  . clopidogrel (PLAVIX) 75 MG tablet Take 1 tablet (75 mg total) by mouth daily.  . Continuous Blood Gluc Sensor (FREESTYLE LIBRE 14 DAY SENSOR) MISC 1 each by Does not apply route every 14 (fourteen) days. Change every 2 weeks  . enalapril (VASOTEC) 20 MG tablet Take 1 tablet by mouth once daily for blood pressure.  . EUTHYROX 112 MCG tablet TAKE 1 TABLET BY MOUTH IN THE MORNING ON  AN  EMPTY  STOMACH.  NO  FOOD  OR  OTHER  MEDICATIONS  FOR  30  MINUTES  . ezetimibe (ZETIA) 10 MG tablet Take 1 tablet (10 mg total) by mouth daily.  Marland Kitchen gabapentin (NEURONTIN) 300 MG capsule Take 1 capsule (300 mg total) by mouth 3 (three) times daily.  . insulin aspart (NOVOLOG FLEXPEN) 100 UNIT/ML FlexPen Inject 20 units before breakfast, lunch, dinner.  . Insulin Glargine (LANTUS SOLOSTAR) 100 UNIT/ML Solostar Pen Inject 50 Units into the skin at bedtime.  . Insulin Pen Needle (INSUPEN  PEN NEEDLES) 32G X 4 MM MISC Use to inject insulin 4 times daily.  . isosorbide mononitrate (IMDUR) 30 MG 24 hr tablet Take 1 tablet (30 mg total) by mouth daily.  Marland Kitchen nystatin cream (MYCOSTATIN) Apply 1 application topically 2 (two) times daily. Apply a thin layer to clean and dry skin for up to 3 weeks.  . rosuvastatin (CRESTOR) 40 MG tablet Take 1 tablet (40 mg total) by mouth daily.  . traZODone (DESYREL) 100 MG tablet TAKE 1 TABLET BY MOUTH AT BEDTIME AS NEEDED FOR SLEEP  Review of Systems      Review of Systems  Constitution: Negative for chills, fever and malaise/fatigue.  Cardiovascular: Positive for claudication and dyspnea on exertion. Negative for chest pain, leg swelling, near-syncope, orthopnea, palpitations and syncope.  Respiratory: Positive for cough. Negative for shortness of breath and wheezing.   Gastrointestinal: Negative for nausea and vomiting.  Neurological: Negative for dizziness, light-headedness and weakness.   All other systems reviewed and are otherwise negative except as noted above.  Physical Exam    VS:  BP 130/80 (BP Location: Left Arm, Patient Position: Sitting, Cuff Size: Normal)   Pulse 72   Ht 5\' 6"  (1.676 m)   Wt 155 lb (70.3 kg)   SpO2 98%   BMI 25.02 kg/m  , BMI Body mass index is 25.02 kg/m. GEN: Well nourished, well developed, in no acute distress. HEENT: normal. Neck: Supple, no JVD, carotid bruits, or masses. Cardiac: RRR, no murmurs, rubs, or gallops. No clubbing, cyanosis, edema.  Radials 2+ and equal bilaterally. PT 1+ and equal bilaterally. Respiratory:  Respirations regular and unlabored, clear to auscultation bilaterally. GI: Soft, nontender, nondistended, BS + x 4. MS: No deformity or atrophy. Skin: Warm and dry, no rash. Neuro:  Strength and sensation are intact. Psych: Normal affect.  Assessment & Plan    1. CAD - Stable with no anginal symptoms. No indication for ischemic evaluation at this time. Continue GDMT aspirin,  plavix, beta blocker, statin, zetia.   2. HLD, LDL goal <70 - Most recent LDL 143. Update lipid panel today. She is presently on Crestor 40mg  daily and Zetia 10mg  daily. Reports compliance. Anticipate she will need referral to lipid clinic for initiation of PCSK9i.  3. Chronic systolic heart failure - Euvolemic and well compensated on exam. Echo 03/2016 LVEF 60-65%. Continue beta blocker/ARB. Continue low sodium diet.   4. HTN - BP well controlled. Continue present antihypertensive regimen.   5. Tobacco abuse - Smoking cessation encouraged. Recommend utilization of 1800QUITNOW. Chantix starter pack provided.  6. COPD - Reports cough and DOE are stable at baseline. Smoking cessation encouraged. Follows with her PCP>   7. Claudication - Reports lower extremity pain with ambulation worse in her RLE than LLE. Has been present over months but has gotten worse. ABI and lower extremity duplex to rule out PAD. At risk due to hx of CAD, smoking.   Disposition: ABI/LE duplex for claudication. Follow up in 6 month(s) with Dr. Rockey Situ or APP.   Loel Dubonnet, NP 08/29/2019, 9:05 AM

## 2019-08-29 NOTE — Patient Instructions (Addendum)
Medication Instructions:  Your physician has recommended you make the following change in your medication:  Start Chantix: - Starter pack x 1 month - Continuing dose pack x 2 months  Call the office when you are nearing the end of your continuing dose pack prescription so we can reassess how you are doing with your smoking. If needed, when can extend this refill for you a bit.   *If you need a refill on your cardiac medications before your next appointment, please call your pharmacy*  Lab Work: Your physician recommends that you return for lab work today: lipid panel, CMET.  If you have labs (blood work) drawn today and your tests are completely normal, you will receive your results only by: Marland Kitchen MyChart Message (if you have MyChart) OR . A paper copy in the mail If you have any lab test that is abnormal or we need to change your treatment, we will call you to review the results.  Testing/Procedures: You had an EKG today. It showed sinus rhythm which is a good result!  Your physician has requested that you have an ankle brachial index (ABI). During this test an ultrasound and blood pressure cuff are used to evaluate the arteries that supply the arms and legs with blood. Allow thirty minutes for this exam. There are no restrictions or special instructions.  Your physician has requested that you have a lower extremity arterial duplex. This test is an ultrasound of the arteries in the legs. It looks at arterial blood flow in the legs. Allow one hour for Lower Arterial scans. There are no restrictions or special instructions  Follow-Up: At [facility], you and your health needs are our priority.  As part of our continuing mission to provide you with exceptional heart care, we have created designated Provider Care Teams.  These Care Teams include your primary Cardiologist (physician) and Advanced Practice Providers (APPs -  Physician Assistants and Nurse Practitioners) who all work together to  provide you with the care you need, when you need it.  Your next appointment:   6 month(s)  The format for your next appointment:   In Person  Provider:   Ida Rogue, MD  Other Instructions   Ankle-Brachial Index Test Why am I having this test? The ankle-brachial index (ABI) test is used to diagnose peripheral vascular disease (PVD). PVD is also known as peripheral arterial disease (PAD). PVD is the blocking or hardening of the arteries anywhere within the circulatory system beyond the heart. PVD is caused by:  Cholesterol deposits in your blood vessels (atherosclerosis). This is the most common cause of this condition.  Irritation and swelling (inflammation) in the blood vessels.  Blood clots in the vessels. Cholesterol deposits cause arteries to narrow. Normal delivery of oxygen to your tissues is affected, causing muscle pain and fatigue. This is called claudication. PVD means that there may also be a buildup of cholesterol:  In your heart. This increases the risk of heart attacks.  In your brain. This increases the risk of strokes. What is being tested? The ankle-brachial index test measures the blood flow in your arms and legs. The blood flow will show if blood vessels in your legs have been narrowed by cholesterol deposits. How do I prepare for this test?  Wear loose clothing.  Do not use any tobacco products, including cigarettes, chewing tobacco, or e-cigarettes, for at least 30 minutes before the test. What happens during the test?  1. You will lie down in a resting  position. 2. Your health care provider will use a blood pressure machine and a small ultrasound device (Doppler) to measure the systolic pressures on your upper arms and ankles. Systolic pressure is the pressure inside your arteries when your heart pumps. 3. Systolic pressure measurements will be taken several times, and at several points, on both the ankle and the arm. 4. Your health care provider  will divide the highest systolic pressure of the ankle by the highest systolic pressure of the arm. The result is the ankle-brachial pressure ratio, or ABI. Sometimes this test will be repeated after you have exercised on a treadmill for 5 minutes. You may have leg pain during the exercise portion of the test if you suffer from PVD. If the index number drops after exercise, this may show that PVD is present. How are the results reported? Your test results will be reported as a value that shows the ratio of your ankle pressure to your arm pressure (ABI ratio). Your health care provider will compare your results to normal ranges that were established after testing a large group of people (reference ranges). Reference ranges may vary among labs and hospitals. For this test, a common reference range is:  ABI ratio of 0.9 to 1.3. What do the results mean? An ABI ratio that is below the reference range is considered abnormal and may indicate PVD in the legs. Talk with your health care provider about what your results mean. Questions to ask your health care provider Ask your health care provider, or the department that is doing the test:  When will my results be ready?  How will I get my results?  What are my treatment options?  What other tests do I need?  What are my next steps? Summary  The ankle-brachial index (ABI) test is used to diagnose peripheral vascular disease (PVD). PVD is also known as peripheral arterial disease (PAD).  The ankle-brachial index test measures the blood flow in your arms and legs.  The highest systolic pressure of the ankle is divided by the highest systolic pressure of the arm. The result is the ABI ratio.  An ABI ratio that is below 0.9 is considered abnormal and may indicate PVD in the legs. This information is not intended to replace advice given to you by your health care provider. Make sure you discuss any questions you have with your health care  provider. Document Revised: 04/29/2018 Document Reviewed: 03/31/2017 Elsevier Patient Education  2020 [employer].

## 2019-08-30 ENCOUNTER — Ambulatory Visit
Payer: Medicare PPO | Attending: Student in an Organized Health Care Education/Training Program | Admitting: Student in an Organized Health Care Education/Training Program

## 2019-08-30 ENCOUNTER — Encounter: Payer: Self-pay | Admitting: Student in an Organized Health Care Education/Training Program

## 2019-08-30 ENCOUNTER — Telehealth: Payer: Self-pay

## 2019-08-30 DIAGNOSIS — M4716 Other spondylosis with myelopathy, lumbar region: Secondary | ICD-10-CM

## 2019-08-30 DIAGNOSIS — G8929 Other chronic pain: Secondary | ICD-10-CM

## 2019-08-30 DIAGNOSIS — M5136 Other intervertebral disc degeneration, lumbar region: Secondary | ICD-10-CM | POA: Diagnosis not present

## 2019-08-30 DIAGNOSIS — G629 Polyneuropathy, unspecified: Secondary | ICD-10-CM | POA: Diagnosis not present

## 2019-08-30 DIAGNOSIS — G894 Chronic pain syndrome: Secondary | ICD-10-CM | POA: Diagnosis not present

## 2019-08-30 DIAGNOSIS — E785 Hyperlipidemia, unspecified: Secondary | ICD-10-CM

## 2019-08-30 DIAGNOSIS — M47812 Spondylosis without myelopathy or radiculopathy, cervical region: Secondary | ICD-10-CM

## 2019-08-30 DIAGNOSIS — M7918 Myalgia, other site: Secondary | ICD-10-CM | POA: Diagnosis not present

## 2019-08-30 LAB — LIPID PANEL
Chol/HDL Ratio: 5.9 ratio — ABNORMAL HIGH (ref 0.0–4.4)
Cholesterol, Total: 200 mg/dL — ABNORMAL HIGH (ref 100–199)
HDL: 34 mg/dL — ABNORMAL LOW (ref >39)
LDL Chol Calc (NIH): 131 mg/dL — ABNORMAL HIGH (ref 0–99)
Triglycerides: 197 mg/dL — ABNORMAL HIGH (ref 0–149)
VLDL Cholesterol Cal: 35 mg/dL (ref 5–40)

## 2019-08-30 LAB — COMPREHENSIVE METABOLIC PANEL
ALT: 14 IU/L (ref 0–32)
AST: 10 IU/L (ref 0–40)
Albumin/Globulin Ratio: 1.9 (ref 1.2–2.2)
Albumin: 4.1 g/dL (ref 3.7–4.7)
Alkaline Phosphatase: 91 IU/L (ref 39–117)
BUN/Creatinine Ratio: 14 (ref 12–28)
BUN: 15 mg/dL (ref 8–27)
Bilirubin Total: 0.4 mg/dL (ref 0.0–1.2)
CO2: 24 mmol/L (ref 20–29)
Calcium: 9.6 mg/dL (ref 8.7–10.3)
Chloride: 99 mmol/L (ref 96–106)
Creatinine, Ser: 1.11 mg/dL — ABNORMAL HIGH (ref 0.57–1.00)
GFR calc Af Amer: 58 mL/min/{1.73_m2} — ABNORMAL LOW (ref >59)
GFR calc non Af Amer: 50 mL/min/{1.73_m2} — ABNORMAL LOW (ref >59)
Globulin, Total: 2.2 g/dL (ref 1.5–4.5)
Glucose: 284 mg/dL — ABNORMAL HIGH (ref 65–99)
Potassium: 4.7 mmol/L (ref 3.5–5.2)
Sodium: 138 mmol/L (ref 134–144)
Total Protein: 6.3 g/dL (ref 6.0–8.5)

## 2019-08-30 MED ORDER — AMITRIPTYLINE HCL 50 MG PO TABS: 50.0000 mg | ORAL_TABLET | Freq: Every day | ORAL | 5 refills | Status: DC

## 2019-08-30 MED ORDER — GABAPENTIN 300 MG PO CAPS: 300.0000 mg | ORAL_CAPSULE | Freq: Three times a day (TID) | ORAL | 5 refills | Status: DC

## 2019-08-30 NOTE — Progress Notes (Signed)
Patient: Sarah Payne  Service Category: E/M  Provider: Gillis Santa, MD  DOB: 1946/01/28  DOS: 08/30/2019  Location: Office  MRN: [id number]  Setting: Ambulatory outpatient  Referring Provider: [name], NP  Type: Established Patient  Specialty: Interventional Pain Management  PCP: [name], NP  Location: Home  Delivery: TeleHealth     Virtual Encounter - Pain Management PROVIDER NOTE: Information contained herein reflects review and annotations entered in association with encounter. Interpretation of such information and data should be left to medically-trained personnel. Information provided to patient can be located elsewhere in the medical record under "Patient Instructions". Document created using STT-dictation technology, any transcriptional errors that may result from process are unintentional.    Contact & Pharmacy Preferred: [phone] Home: [phone] (home) Mobile: There is no such number on file (mobile). E-mail: [email or web address] .[facility] [address] [place] [address] Phone: [phone] Fax: [phone]   Pre-screening  Sarah Payne offered "in-person" vs "virtual" encounter. She indicated preferring virtual for this encounter.   Reason COVID-19*  Social distancing based on CDC and AMA recommendations.   I contacted Sarah Payne on 08/30/2019 via telephone.      I clearly identified myself as Gillis Santa, MD. I verified that I was speaking with the correct person using two identifiers (Name: Sarah Payne, and date of birth: October 04, 1945).  This visit was completed via telephone due to the restrictions of the COVID-19 pandemic. All issues as above were discussed and addressed but no physical exam was performed. If it was felt that the patient should be evaluated in the office, they were directed there. The patient verbally consented to this visit. Patient was unable to complete an audio/visual  visit due to Technical difficulties and/or Lack of internet. Due to the catastrophic nature of the COVID-19 pandemic, this visit was done through audio contact only.  Location of the patient: home address (see Epic for details)  Location of the provider: office   Consent I sought verbal advanced consent from Sarah Payne for virtual visit interactions. I informed Sarah Payne of possible security and privacy concerns, risks, and limitations associated with providing "not-in-person" medical evaluation and management services. I also informed Sarah Payne of the availability of "in-person" appointments. Finally, I informed her that there would be a charge for the virtual visit and that she could be  personally, fully or partially, financially responsible for it. Sarah Payne expressed understanding and agreed to proceed.   Historic Elements   Sarah Payne is a 74 y.o. year old, female patient evaluated today after her last contact with our practice on 05/04/2019. Sarah Payne  has a past medical history of Cataract, Chronic low back pain, COPD (chronic obstructive pulmonary disease) ([place]), Coronary artery disease, Depression, Diabetes mellitus, Diverticulosis, Frequent PVCs, GERD (gastroesophageal reflux disease), Hyperlipidemia, Hypertension, Hypothyroidism, Impingement syndrome of left shoulder, Marijuana abuse, Myocardial infarction [facility]), Sleep apnea, Takotsubo cardiomyopathy, Tendonitis of left rotator cuff, Tobacco abuse, and Vascular dementia. She also  has a past surgical history that includes Back surgery; Appendectomy; Abdominal hysterectomy; Colonoscopy (N/A, 11/02/2014); Cataract extraction w/PHACO (Left, 01/30/2015); Cardiac catheterization (2013); Coronary angioplasty (2012); Coronary angioplasty with stent (2013); Cardiac catheterization (2016); Cataract extraction w/PHACO (Right, 02/13/2015); Cardiac catheterization (N/A, 01/01/2016); Esophagogastroduodenoscopy (egd) with propofol (N/A, 09/21/2018); and  Shoulder surgery (Left, 2017). Sarah Payne has a current medication list which includes the following prescription(s): amitriptyline, aspirin, carvedilol, clopidogrel, freestyle libre 14 day sensor, enalapril,  euthyrox, ezetimibe, gabapentin, novolog flexpen, lantus solostar, insulin pen needle, isosorbide mononitrate, nystatin cream, rosuvastatin, trazodone, varenicline, chantix starting month pak, and amitriptyline. She  reports that she has been smoking cigarettes. She has a 45.00 pack-year smoking history. She has never used smokeless tobacco. She reports current drug use. Drug: Marijuana. She reports that she does not drink alcohol. Sarah Payne is allergic to no known allergies.   HPI  Today, she is being contacted for medication management.   No change in medical history since last visit.  Patient's pain is at baseline.  Patient continues multimodal pain regimen as prescribed.  States that it provides pain relief and improvement in functional status.  Continues Gabapentin as prescribed, refill Amitriptyline.   Laboratory Chemistry Profile   Renal Lab Results  Component Value Date   BUN 15 08/29/2019   CREATININE 1.11 (H) 08/29/2019   BCR 14 08/29/2019   GFR 60.20 01/17/2019   GFRAA 58 (L) 08/29/2019   GFRNONAA 50 (L) 08/29/2019    Hepatic Lab Results  Component Value Date   AST 10 08/29/2019   ALT 14 08/29/2019   ALBUMIN 4.1 08/29/2019   ALKPHOS 91 08/29/2019   HCVAB NEGATIVE 03/12/2015   AMYLASE 32 04/11/2016   LIPASE 55.0 04/11/2016    Electrolytes Lab Results  Component Value Date   NA 138 08/29/2019   K 4.7 08/29/2019   CL 99 08/29/2019   CALCIUM 9.6 08/29/2019   MG 1.1 (L) 06/12/2014    Bone Lab Results  Component Value Date   VD25OH 41 02/25/2013    Coagulation Lab Results  Component Value Date   INR 1.01 01/09/2016   LABPROT 13.5 01/09/2016   APTT 109 (H) 01/01/2016   PLT 274 08/24/2018    Cardiovascular Lab Results  Component Value Date   BNP 91  08/01/2014   CKTOTAL 272 (H) 08/01/2014   CKMB 4.5 (H) 08/01/2014   TROPONINI <0.03 08/24/2018   HGB 14.3 08/24/2018   HCT 43.0 08/24/2018    Inflammation (CRP: Acute Phase) (ESR: Chronic Phase) Lab Results  Component Value Date   CRP 2.1 04/11/2013   ESRSEDRATE 58 (H) 04/11/2013      Note: Above Lab results reviewed.  Imaging  DG Abd 2 Views CLINICAL DATA:  Abdominal bloating and distention.  EXAM: ABDOMEN - 2 VIEW  COMPARISON:  Abdominal x-ray dated April 04, 2016.  FINDINGS: The bowel gas pattern is normal. There is no evidence of free air. No large colonic stool burden. No radio-opaque calculi or other significant radiographic abnormality is seen. Calcifications projecting over the right kidney are vascular as demonstrated on prior CTs. No acute osseous abnormality. Prior L3-S1 PLIF with unchanged loosening of the left S1 pedicle screw. Severe degenerative disc disease at L1-L2 and L2-L3. L1-L2 spondylosis has progressed since 2017.  IMPRESSION: No acute findings.  Electronically Signed   By: Titus Dubin M.D.   On: 03/22/2019 17:57  Assessment  The primary encounter diagnosis was Peripheral polyneuropathy. Diagnoses of Lumbar spondylosis with myelopathy, Lumbar degenerative disc disease, Chronic musculoskeletal pain, Osteoarthritis of cervical spine, unspecified spinal osteoarthritis complication status, and Chronic pain syndrome were also pertinent to this visit.  Plan of Care  I am having Sarah Payne start on amitriptyline. I am also having her maintain her aspirin, Insulin Pen Needle, nystatin cream, clopidogrel, enalapril, ezetimibe, isosorbide mononitrate, rosuvastatin, carvedilol, FreeStyle Libre 14 Day Sensor, amitriptyline, NovoLOG FlexPen, Lantus SoloStar, traZODone, Euthyrox, Chantix Starting [employer], varenicline, and gabapentin.  Pharmacotherapy (Medications Ordered): Meds ordered  this encounter  Medications  . gabapentin (NEURONTIN) 300  MG capsule    Sig: Take 1 capsule (300 mg total) by mouth 3 (three) times daily.    Dispense:  90 capsule    Refill:  5  . amitriptyline (ELAVIL) 50 MG tablet    Sig: Take 1 tablet (50 mg total) by mouth at bedtime.    Dispense:  30 tablet    Refill:  5   Follow-up plan:   Return in about 6 months (around 02/27/2020) for Medication Management, in person.    Recent Visits No visits were found meeting these conditions.  Showing recent visits within past 90 days and meeting all other requirements   Today's Visits Date Type Provider Dept  08/30/19 Office Visit Gillis Santa, MD [facility]  Showing today's visits and meeting all other requirements   Future Appointments Date Type Provider Dept  11/01/19 Appointment Gillis Santa, MD [facility]  Showing future appointments within next 90 days and meeting all other requirements   I discussed the assessment and treatment plan with the patient. The patient was provided an opportunity to ask questions and all were answered. The patient agreed with the plan and demonstrated an understanding of the instructions.  Patient advised to call back or seek an in-person evaluation if the symptoms or condition worsens.  Duration of encounter: 15 minutes.  Note by: Gillis Santa, MD Date: 08/30/2019; Time: 1:52 PM

## 2019-08-30 NOTE — Telephone Encounter (Signed)
-----   Message from Loel Dubonnet, NP sent at 08/30/2019  9:27 AM EST ----- Glucose elevated, continue to monitor at home and follow with PCP for DM2. Kidney function mild decline, recommend she stay well hydrated. Normal liver function and electrolytes.. Cholesterol panel shows her LDL remains above goal of 70 (it was 131). As discussed in office visit, we will refer to lipid clinic for consideration of PCSK9i.

## 2019-08-30 NOTE — Telephone Encounter (Signed)
Call to patient to review results and POC from provider.   Pt verbalized understanding and in agreement with POC. Order placed for lipid clinic.   Advised pt to call for any further questions or concerns. Confirmed upcoming echo appt.

## 2019-09-01 ENCOUNTER — Ambulatory Visit: Payer: Medicare PPO | Admitting: Physician Assistant

## 2019-09-06 ENCOUNTER — Other Ambulatory Visit: Payer: Self-pay | Admitting: Primary Care

## 2019-09-06 DIAGNOSIS — I739 Peripheral vascular disease, unspecified: Secondary | ICD-10-CM

## 2019-09-14 ENCOUNTER — Telehealth: Payer: Self-pay | Admitting: Primary Care

## 2019-09-14 NOTE — Telephone Encounter (Signed)
Spoken to patient and she stated that she did agree for [facility] to send over the order. I placed in [name]'s inbox.

## 2019-09-14 NOTE — Telephone Encounter (Signed)
Caryl Pina calling to check status on an order that was faxed 2/23

## 2019-09-15 ENCOUNTER — Ambulatory Visit: Payer: Medicare PPO | Attending: Internal Medicine

## 2019-09-15 DIAGNOSIS — Z23 Encounter for immunization: Secondary | ICD-10-CM

## 2019-09-15 NOTE — Telephone Encounter (Signed)
Completed and handed to [place].

## 2019-09-15 NOTE — Progress Notes (Signed)
   Covid-19 Vaccination Clinic  Name:  Sarah Payne    MRN: [id number] DOB: 06-14-1946  09/15/2019  [name] was observed post Covid-19 immunization for 15 minutes without incidence. She was provided with Vaccine Information Sheet and instruction to access the V-Safe system.   [name] was instructed to call 911 with any severe reactions post vaccine: Marland Kitchen Difficulty breathing  . Swelling of your face and throat  . A fast heartbeat  . A bad rash all over your body  . Dizziness and weakness    Immunizations Administered    Name Date Dose VIS Date Route   Pfizer COVID-19 Vaccine 09/15/2019  8:26 AM 0.3 mL 07/01/2019 Intramuscular   Manufacturer: [place]   Lot: [id number]   [place]: [id number]

## 2019-09-19 ENCOUNTER — Telehealth: Payer: Self-pay

## 2019-09-19 ENCOUNTER — Telehealth: Payer: Self-pay | Admitting: Primary Care

## 2019-09-19 NOTE — Telephone Encounter (Signed)
I received a call from [place] with [employer]. They are trying to get patient approved for her [facility]. He states they need a copy of her medication list with a list of the test strips she should use, and how often she should test, as well as her most recent A1C. I do not see a list of test  information on the medication list, and how often she should test her blood sugars. Vallarie Mare, please advise.  He states this information can be faxed to [phone].

## 2019-09-19 NOTE — Telephone Encounter (Signed)
Arbie Cookey called wanting to know if you received written detail order for glucose monitoring  machine supply

## 2019-09-19 NOTE — Telephone Encounter (Signed)
This was faxed on Thursday. Well re-fax form.

## 2019-09-20 NOTE — Telephone Encounter (Signed)
This is not from [facility]. Sarah Payne is from [place] and did received fax for requested office visit. Will fax.

## 2019-09-20 NOTE — Telephone Encounter (Signed)
[place] left v/m about continuous glucose monitor. Mickel Baas said it is needed in pts note that pt test 3 x a day. Please fax to [phone]. I did not see instructions to ck BS tid in last note or on med list.Please advise.

## 2019-09-21 NOTE — Telephone Encounter (Signed)
Mickel Baas called back today about this again. I advised Mickel Baas that I am faxing over information right now.

## 2019-09-26 ENCOUNTER — Other Ambulatory Visit: Payer: Self-pay

## 2019-09-26 DIAGNOSIS — E1151 Type 2 diabetes mellitus with diabetic peripheral angiopathy without gangrene: Secondary | ICD-10-CM

## 2019-09-26 DIAGNOSIS — L900 Lichen sclerosus et atrophicus: Secondary | ICD-10-CM

## 2019-09-26 MED ORDER — CLOBETASOL PROPIONATE 0.05 % EX OINT: TOPICAL_OINTMENT | CUTANEOUS | 0 refills | Status: DC

## 2019-09-26 MED ORDER — FREESTYLE LITE TEST VI STRP: ORAL_STRIP | 5 refills | Status: DC

## 2019-09-26 NOTE — Telephone Encounter (Signed)
Pt left v/m requesting refill clobetasol propionate and diabetic test strips for freestyle light.

## 2019-09-26 NOTE — Telephone Encounter (Signed)
clobetasol 0.05% was last prescribed by Dr Derrel Nip on 10/2014. Patient stated that, she have not had this problem since then and this is the only thing that works on the rawness of the vaginal folds. It gets so bad that sometimes bleed. Patient stated that she will make appointment if Anda Kraft needs her but please refill this.

## 2019-09-26 NOTE — Telephone Encounter (Signed)
Noted, refills sent to pharmacy. 

## 2019-09-27 NOTE — Telephone Encounter (Signed)
Spoken and notified patient of [name]'s comments. Patient verbalized understanding.  

## 2019-09-30 ENCOUNTER — Telehealth: Payer: Self-pay | Admitting: Primary Care

## 2019-09-30 NOTE — Chronic Care Management (AMB) (Signed)
  Chronic Care Management   Note  09/30/2019 Name: Sarah Payne MRN: [id number] DOB: [birth year]  Sarah Payne is a 74 y.o. year old female who is a primary care patient of [name], NP. I reached out to [name] by phone today in response to a referral sent by Ms. Megan Salon Horne's PCP, [name], NP.   Sarah Payne was given information about Chronic Care Management services today including:  1. CCM service includes personalized support from designated clinical staff supervised by her physician, including individualized plan of care and coordination with other care providers 2. 24/7 contact phone numbers for assistance for urgent and routine care needs. 3. Service will only be billed when office clinical staff spend 20 minutes or more in a month to coordinate care. 4. Only one practitioner may furnish and bill the service in a calendar month. 5. The patient may stop CCM services at any time (effective at the end of the month) by phone call to the office staff.   Patient agreed to services and verbal consent obtained.   Follow up plan:   Raynicia Dukes [employer] Scheduler

## 2019-10-03 ENCOUNTER — Other Ambulatory Visit: Payer: Self-pay

## 2019-10-03 ENCOUNTER — Ambulatory Visit (INDEPENDENT_AMBULATORY_CARE_PROVIDER_SITE_OTHER): Payer: Medicare PPO

## 2019-10-03 DIAGNOSIS — I739 Peripheral vascular disease, unspecified: Secondary | ICD-10-CM

## 2019-10-11 ENCOUNTER — Ambulatory Visit: Payer: Medicare PPO | Attending: Internal Medicine

## 2019-10-11 DIAGNOSIS — Z23 Encounter for immunization: Secondary | ICD-10-CM

## 2019-10-11 NOTE — Progress Notes (Signed)
   Covid-19 Vaccination Clinic  Name:  Sarah Payne    MRN: [id number] DOB: 12-01-1945  10/11/2019  [name] was observed post Covid-19 immunization for 15 minutes without incident. She was provided with Vaccine Information Sheet and instruction to access the V-Safe system.   [name] was instructed to call 911 with any severe reactions post vaccine: Marland Kitchen Difficulty breathing  . Swelling of face and throat  . A fast heartbeat  . A bad rash all over body  . Dizziness and weakness   Immunizations Administered    Name Date Dose VIS Date Route   Pfizer COVID-19 Vaccine 10/11/2019  8:24 AM 0.3 mL 07/01/2019 Intramuscular   Manufacturer: [place]   Lot: [id number]   [place]: [id number]

## 2019-10-19 ENCOUNTER — Telehealth: Payer: Medicare PPO

## 2019-10-22 ENCOUNTER — Telehealth: Payer: Self-pay

## 2019-10-22 DIAGNOSIS — E1151 Type 2 diabetes mellitus with diabetic peripheral angiopathy without gangrene: Secondary | ICD-10-CM

## 2019-10-22 DIAGNOSIS — I10 Essential (primary) hypertension: Secondary | ICD-10-CM

## 2019-10-22 NOTE — Telephone Encounter (Signed)
I would like to request a referral for Sarah Payne to chronic care management pharmacy services for the following conditions:   Essential hypertension, benign  [I10]  Diabetes mellitus type 2 with peripheral artery disease [E11.51]  [name], PharmD Clinical Pharmacist [facility] at [facility] [phone]

## 2019-10-24 NOTE — Telephone Encounter (Signed)
Noted  Referral placed.

## 2019-10-28 ENCOUNTER — Other Ambulatory Visit: Payer: Self-pay

## 2019-10-28 ENCOUNTER — Telehealth: Payer: Self-pay

## 2019-10-28 ENCOUNTER — Ambulatory Visit: Payer: Medicare PPO

## 2019-10-28 DIAGNOSIS — Z720 Tobacco use: Secondary | ICD-10-CM

## 2019-10-28 DIAGNOSIS — F17200 Nicotine dependence, unspecified, uncomplicated: Secondary | ICD-10-CM

## 2019-10-28 DIAGNOSIS — F329 Major depressive disorder, single episode, unspecified: Secondary | ICD-10-CM

## 2019-10-28 DIAGNOSIS — E782 Mixed hyperlipidemia: Secondary | ICD-10-CM

## 2019-10-28 DIAGNOSIS — E1151 Type 2 diabetes mellitus with diabetic peripheral angiopathy without gangrene: Secondary | ICD-10-CM

## 2019-10-28 DIAGNOSIS — I10 Essential (primary) hypertension: Secondary | ICD-10-CM

## 2019-10-28 DIAGNOSIS — K219 Gastro-esophageal reflux disease without esophagitis: Secondary | ICD-10-CM

## 2019-10-28 DIAGNOSIS — G894 Chronic pain syndrome: Secondary | ICD-10-CM

## 2019-10-28 DIAGNOSIS — G4700 Insomnia, unspecified: Secondary | ICD-10-CM

## 2019-10-28 DIAGNOSIS — I2510 Atherosclerotic heart disease of native coronary artery without angina pectoris: Secondary | ICD-10-CM

## 2019-10-28 DIAGNOSIS — J449 Chronic obstructive pulmonary disease, unspecified: Secondary | ICD-10-CM

## 2019-10-28 DIAGNOSIS — E039 Hypothyroidism, unspecified: Secondary | ICD-10-CM

## 2019-10-28 DIAGNOSIS — F4323 Adjustment disorder with mixed anxiety and depressed mood: Secondary | ICD-10-CM

## 2019-10-28 DIAGNOSIS — I5022 Chronic systolic (congestive) heart failure: Secondary | ICD-10-CM

## 2019-10-28 DIAGNOSIS — F32A Depression, unspecified: Secondary | ICD-10-CM

## 2019-10-28 MED ORDER — CHANTIX STARTING MONTH PAK 0.5 MG X 11 & 1 MG X 42 PO TABS: ORAL_TABLET | ORAL | 0 refills | Status: DC

## 2019-10-28 NOTE — Telephone Encounter (Signed)
Patient reports Chantix was started by cardiology but she never started the medication due to cost. The prescription was sent to [facility] and it was not covered by insurance. She is still interested in quitting smoking (current use 1 PPD) with Chantix and would like to know if you would send the starter pack to [facility]?  [name], PharmD Clinical Pharmacist [facility] at [facility] [phone]

## 2019-10-28 NOTE — Addendum Note (Signed)
Addended by: Verlon Au on: 10/28/2019 04:58 PM   Modules accepted: Orders

## 2019-10-28 NOTE — Telephone Encounter (Signed)
Hi,  Lipid panel 08/2019 total cholesterol 200, HDL 34, LDL 131, triglycerides 197. At office visit she assured me she was on both Crestor 40mg  daily and Zetia 10mg  daily. However, now I question whether she was taking it. Doesn't look her her LDL has ever been at goal of <70.   At the time I recommended a lipid clinic referral. Looks like it was accidentally not placed. It was placed today.   When you speak with her next week let me know what she is/is not taking. If she has not been taking Crestor/Zetia would like to resume both at previous doses. I am happy to send in if I need to. She may still require PCSK9i and conversation with pharmacist in the lipid clinic, but that way we've seen what response we might get from oral medications.   In regards to Plavix, her stents were patent at cath 2017 and she has no bleeding complications. As she continues to smoke DAPT with aspirin and Plavix may be beneficial for protection. However, if it is going to cause more confusion in regards to her medication regimen and compliance aspirin alone would be acceptable.   Loel Dubonnet, NP

## 2019-10-28 NOTE — Telephone Encounter (Signed)
Will cc to cardiology, looks like she may be headed to the lipid clinic?  See other phone note and have her scheduled for next week. (April 12th-16th). She seems to be very misinformed and confused regarding her regimen. We need to get her back on track. Let me know when she has been scheduled.

## 2019-10-28 NOTE — Telephone Encounter (Signed)
Patient reports needing refills on rosuvastatin, zetia, and plavix. She has been out of these medications for unknown period of time. Reports that PCP usually sends all medication refills to the [place]. Let me know if more information is needed.  Thanks!  [name], PharmD Clinical Pharmacist [facility] at [facility] [phone]

## 2019-10-28 NOTE — Telephone Encounter (Signed)
This is regarding rosuvastatin, zetia, and plavix.

## 2019-10-28 NOTE — Patient Instructions (Signed)
October 28, 2019  Dear Sarah Payne,  It was a pleasure meeting you during our initial appointment on October 28, 2019. Below is a summary of the goals we discussed and components of chronic care management. Please contact me anytime with questions or concerns.   Visit Information  Goals Addressed            This Visit's Progress   . Pharmacy Care Plan       CARE PLAN ENTRY  Current Barriers:  . Chronic Disease Management support, education, and care coordination needs related to diabetes, hyperlipidemia, tobacco use, chronic pain, depression, CAD, CHF, hypertension, COPD, GERD, hypothyroidism, neuropathy  Pharmacist Clinical Goal(s):  Marland Kitchen Improve diabetes control with fasting blood glucose goal within 80-130 mg/dL and 2 hours after meals goal of less than 180 mg/dL. Prevent hypoglycemia (blood sugar less than 70). Patient will take Novolog 20 units 5-15 minutes before meals and Lantus 60 units at bedtime. Do not take Novolog if not eating and avoid delaying the dose until after meals. Continue to monitor blood glucose daily and recommend keeping a written log of your readings and meals to assess cause of low blood sugars. Will follow up in 2 weeks.  . Reduce cholesterol with LDL goal of less than 70. Patient will resume rosuvastatin, Zetia, and Plavix.  . Work towards goal of smoking cessation. Recommend avoiding electronic cigarettes. Patient plans to call 1-800 QUIT NOW for smoking counseling services. Will consult with Anda Kraft regarding Chantix prescription and cost.  . Resolve vaginal itching and burning. Patient will schedule appointment for evaluation with Anda Kraft.  . Remain up to date on vaccinations. Recommend the 2-doses series of the shingles vaccine (Shingrix).   Interventions: . Comprehensive medication review performed . Requested refills for rosuvastatin, Zetia, and Plavix.   Patient Self Care Activities:  . Self administers medications as prescribed . Self-monitors blood pressure  and blood glucose  Initial goal documentation       Sarah Payne was given information about Chronic Care Management services today including:  1. CCM service includes personalized support from designated clinical staff supervised by her physician, including individualized plan of care and coordination with other care providers 2. 24/7 contact phone numbers for assistance for urgent and routine care needs. 3. Standard insurance, coinsurance, copays and deductibles apply for chronic care management only during months in which we provide at least 20 minutes of these services. Most insurances cover these services at 100%, however patients may be responsible for any copay, coinsurance and/or deductible if applicable. This service may help you avoid the need for more expensive face-to-face services. 4. Only one practitioner may furnish and bill the service in a calendar month. 5. The patient may stop CCM services at any time (effective at the end of the month) by phone call to the office staff.  Patient agreed to services and verbal consent obtained.   The patient verbalized understanding of instructions provided today and agreed to receive a mailed copy of patient instruction and/or educational materials. Telephone follow up appointment with pharmacy team member scheduled for: 11/11/19 at 8:30 AM (telephone)  [name], PharmD Clinical Pharmacist [place] at [facility] [phone]   Preventing Hypoglycemia Hypoglycemia occurs when the level of sugar (glucose) in the blood is too low. Hypoglycemia can happen in people who do or do not have diabetes (diabetes mellitus). It can develop quickly, and it can be a medical emergency. For most people with diabetes, a blood glucose level below 70 mg/dL (  3.9 mmol/L) is considered hypoglycemia. Glucose is a type of sugar that provides the body's main source of energy. Certain hormones (insulin and glucagon) control the level of glucose in  the blood. Insulin lowers blood glucose, and glucagon increases blood glucose. Hypoglycemia can result from having too much insulin in the bloodstream, or from not eating enough food that contains glucose. Your risk for hypoglycemia is higher:  If you take insulin or diabetes medicines to help lower your blood glucose or help your body make more insulin.  If you skip or delay a meal or snack.  If you are ill.  During and after exercise. You can prevent hypoglycemia by working with your health care provider to adjust your meal plan as needed and by taking other precautions. How can hypoglycemia affect me? Mild symptoms Mild hypoglycemia may not cause any symptoms. If you do have symptoms, they may include:  Hunger.  Anxiety.  Sweating and feeling clammy.  Dizziness or feeling light-headed.  Sleepiness.  Nausea.  Increased heart rate.  Headache.  Blurry vision.  Irritability.  Tingling or numbness around the mouth, lips, or tongue.  A change in coordination.  Restless sleep. If mild hypoglycemia is not recognized and treated, it can quickly become moderate or severe hypoglycemia. Moderate symptoms Moderate hypoglycemia can cause:  Mental confusion and poor judgment.  Behavior changes.  Weakness.  Irregular heartbeat. Severe symptoms Severe hypoglycemia is a medical emergency. It can cause:  Fainting.  Seizures.  Loss of consciousness (coma).  Death. What nutrition changes can be made?  Work with your health care provider or diet and nutrition specialist (dietitian) to make a healthy meal plan that is right for you. Follow your meal plan carefully.  Eat meals at regular times.  If recommended by your health care provider, have snacks between meals.  Donot skip or delay meals or snacks. You can be at risk for hypoglycemia if you are not getting enough carbohydrates. What lifestyle changes can be made?   Work closely with your health care provider  to manage your blood glucose. Make sure you know: ? Your goal blood glucose levels. ? How and when to check your blood glucose. ? The symptoms of hypoglycemia. It is important to treat it right away to keep it from becoming severe.  Do not drink alcohol on an empty stomach.  When you are ill, check your blood glucose more often than usual. Follow your sick day plan whenever you cannot eat or drink normally. Make this plan in advance with your health care provider.  Always check your blood glucose before, during, and after exercise. How is this treated? This condition can often be treated by immediately eating or drinking something that contains sugar, such as:  Fruit juice, 4-6 oz (120-150 mL).  Regular (not diet) soda, 4-6 oz (120-150 mL).  Low-fat milk, 4 oz (120 mL).  Several pieces of hard candy.  Sugar or honey, 1 Tbsp (15 mL). Treating hypoglycemia if you have diabetes If you are alert and able to swallow safely, follow the 15:15 rule:  Take 15 grams of a rapid-acting carbohydrate. Talk with your health care provider about how much you should take.  Rapid-acting options include: ? Glucose pills (take 15 grams). ? 6-8 pieces of hard candy. ? 4-6 oz (120-150 mL) of fruit juice. ? 4-6 oz (120-150 mL) of regular (not diet) soda.  Check your blood glucose 15 minutes after you take the carbohydrate.  If the repeat blood glucose level is still  at or below 70 mg/dL (3.9 mmol/L), take 15 grams of a carbohydrate again.  If your blood glucose level does not increase above 70 mg/dL (3.9 mmol/L) after 3 tries, seek emergency medical care.  After your blood glucose level returns to normal, eat a meal or a snack within 1 hour. Treating severe hypoglycemia Severe hypoglycemia is when your blood glucose level is at or below 54 mg/dL (3 mmol/L). Severe hypoglycemia is a medical emergency. Get medical help right away. If you have severe hypoglycemia and you cannot eat or drink, you may  need an injection of glucagon. A family member or close friend should learn how to check your blood glucose and how to give you a glucagon injection. Ask your health care provider if you need to have an emergency glucagon injection kit available. Severe hypoglycemia may need to be treated in a hospital. The treatment may include getting glucose through an IV. You may also need treatment for the cause of your hypoglycemia. Where to find more information  American Diabetes Association: www.diabetes.[employer] of Diabetes and Digestive and Kidney Diseases: [employer] Contact a health care provider if:  You have problems keeping your blood glucose in your target range.  You have frequent episodes of hypoglycemia. Get help right away if:  You continue to have hypoglycemia symptoms after eating or drinking something containing glucose.  Your blood glucose level is at or below 54 mg/dL (3 mmol/L).  You faint.  You have a seizure. These symptoms may represent a serious problem that is an emergency. Do not wait to see if the symptoms will go away. Get medical help right away. Call your local emergency services (911 in the [place] Summary  Know the symptoms of hypoglycemia, and when you are at risk for it (such as during exercise or when you are sick). Check your blood glucose often when you are at risk for hypoglycemia.  Hypoglycemia can develop quickly, and it can be dangerous if it is not treated right away. If you have a history of severe hypoglycemia, make sure you know how to use your glucagon injection kit.  Make sure you know how to treat hypoglycemia. Keep a carbohydrate snack available when you may be at risk for hypoglycemia. This information is not intended to replace advice given to you by your health care provider. Make sure you discuss any questions you have with your health care provider. Document Revised: 10/29/2018 Document Reviewed: 03/04/2017 Elsevier  Patient Education  [place].

## 2019-10-28 NOTE — Telephone Encounter (Signed)
Verbal from [facility], missed order for ref to lipid clinic. Order placed at this time. Provider aware.

## 2019-10-28 NOTE — Telephone Encounter (Signed)
Please notify patient that I will send the starter pack of Chantix to her mail pharmacy. She has to choose her quit date before she starts Chantix. Quit date must be within the first week.  She is also overdue for diabetes follow up with me. Have this scheduled for next week if possible.

## 2019-10-28 NOTE — Chronic Care Management (AMB) (Signed)
Chronic Care Management Pharmacy  Name: Sarah Payne  MRN: [id number] DOB: November 11, 1945  Chief Complaint/ HPI  Sarah Payne,  74 y.o., female presents for their Initial CCM visit with the clinical pharmacist via telephone.  PCP : Sarah Koch, NP  Their chronic conditions include: diabetes, hyperlipidemia, tobacco use, chronic pain, depression, CAD, CHF, hypertension, COPD, GERD, hypothyroidism, neuropathy  Patient concerns: vaginal burning/itching all day for the past year, denies odor, applying cortisone cream once or twice daily per pcp with some relief, asking if related to any medications --> do not believe this is medication induced, referred to PCP for office visit; reports new medications in the last 6 months include: cyclobenzaprine, amitriptyline, gabapentin   Office Visits:  04/25/19: Sarah Payne - continue Lantus 50 units qhs, reduce Novolog to 20 units before meals, begin exercising, rtc 3 months  Consult Visit:  08/30/19: Peripheral polyneuropathy - pain is at baselines, continues gabapentin, refill amitriptyline  08/29/19: Cardiology - CAD - continue aspirin, plavix, beta blocker, statin, zetia, LDL above goal on crestor and zetia - anticipate referral to lipid clinic, begin Chantix start pack, rule out PAD, rtc 6 months, chf, copd stable   Allergies  Allergen Reactions  . No Known Allergies    Medications: Outpatient Encounter Medications as of 10/28/2019  Medication Sig  . amitriptyline (ELAVIL) 50 MG tablet Take 1 tablet (50 mg total) by mouth at bedtime.  Marland Kitchen amitriptyline (ELAVIL) 50 MG tablet Take 1 tablet (50 mg total) by mouth at bedtime.  Marland Kitchen aspirin 81 MG tablet Take 81 mg by mouth daily.    . carvedilol (COREG) 6.25 MG tablet Take 1 tablet (6.25 mg total) by mouth 2 (two) times daily.  . clobetasol ointment (TEMOVATE) 0.05 % APPLY EVERY NIGHT TO VAGINAL FOLDS  . clopidogrel (PLAVIX) 75 MG tablet Take 1 tablet (75 mg total) by mouth daily.  . Continuous Blood  Gluc Sensor (FREESTYLE LIBRE 14 DAY SENSOR) MISC 1 each by Does not apply route every 14 (fourteen) days. Change every 2 weeks  . enalapril (VASOTEC) 20 MG tablet Take 1 tablet by mouth once daily for blood pressure.  . EUTHYROX 112 MCG tablet TAKE 1 TABLET BY MOUTH IN THE MORNING ON  AN  EMPTY  STOMACH.  NO  FOOD  OR  OTHER  MEDICATIONS  FOR  30  MINUTES  . ezetimibe (ZETIA) 10 MG tablet Take 1 tablet (10 mg total) by mouth daily.  Marland Kitchen gabapentin (NEURONTIN) 300 MG capsule Take 1 capsule (300 mg total) by mouth 3 (three) times daily.  Marland Kitchen glucose blood (FREESTYLE LITE) test strip Use as instructed to test blood sugar 3 times a day  . insulin aspart (NOVOLOG FLEXPEN) 100 UNIT/ML FlexPen Inject 20 units before breakfast, lunch, dinner.  . Insulin Glargine (LANTUS SOLOSTAR) 100 UNIT/ML Solostar Pen Inject 50 Units into the skin at bedtime.  . Insulin Pen Needle (INSUPEN PEN NEEDLES) 32G X 4 MM MISC Use to inject insulin 4 times daily.  . isosorbide mononitrate (IMDUR) 30 MG 24 hr tablet Take 1 tablet (30 mg total) by mouth daily.  Marland Kitchen nystatin cream (MYCOSTATIN) Apply 1 application topically 2 (two) times daily. Apply a thin layer to clean and dry skin for up to 3 weeks.  . rosuvastatin (CRESTOR) 40 MG tablet Take 1 tablet (40 mg total) by mouth daily.  . traZODone (DESYREL) 100 MG tablet TAKE 1 TABLET BY MOUTH AT BEDTIME AS NEEDED FOR SLEEP  . varenicline (CHANTIX CONTINUING MONTH PAK) 1  MG tablet Take 1 tablet (1 mg total) by mouth 2 (two) times daily.  . varenicline (CHANTIX STARTING MONTH PAK) 0.5 MG X 11 & 1 MG X 42 tablet Take one 0.5 mg tablet by mouth once daily for 3 days, then increase to one 0.5 mg tablet twice daily for 4 days, then increase to one 1 mg tablet twice daily.   No facility-administered encounter medications on file as of 10/28/2019.   Current Diagnosis/Assessment: Goals    . Patient Stated     Starting 11/17/2017, I will continue to take medications as prescribed.     . Pharmacy  Care Plan     CARE PLAN ENTRY  Current Barriers:  . Chronic Disease Management support, education, and care coordination needs related to diabetes, hyperlipidemia, tobacco use, chronic pain, depression, CAD, CHF, hypertension, COPD, GERD, hypothyroidism, neuropathy  Pharmacist Clinical Goal(s):  Marland Kitchen Improve diabetes control with fasting blood glucose goal within 80-130 mg/dL and 2 hours after meals goal of less than 180 mg/dL. Prevent hypoglycemia (blood sugar less than 70). Patient will take Novolog 20 units 5-15 minutes before meals and Lantus 60 units at bedtime. Do not take Novolog if not eating and avoid delaying the dose until after meals. Continue to monitor blood glucose daily and recommend keeping a written log of your readings and meals to assess cause of low blood sugars. Will follow up in 2 weeks.  . Reduce cholesterol with LDL goal of less than 70. Patient will resume rosuvastatin, Zetia, and Plavix.  . Work towards goal of smoking cessation. Recommend avoiding electronic cigarettes. Patient plans to call 1-800 QUIT NOW for smoking counseling services. Will consult with Sarah Payne regarding Chantix prescription and cost.  . Resolve vaginal itching and burning. Patient will schedule appointment for evaluation with Sarah Payne.  . Remain up to date on vaccinations. Recommend the 2-doses series of the shingles vaccine (Shingrix).   Interventions: . Comprehensive medication review performed . Requested refills for rosuvastatin, Zetia, and Plavix.   Patient Self Care Activities:  . Self administers medications as prescribed . Self-monitors blood pressure and blood glucose  Initial goal documentation      Hyperlipidemia   Lipid Panel     Component Value Date/Time   CHOL 200 (H) 08/29/2019 0947   CHOL 292 (H) 08/02/2014 0408   TRIG 197 (H) 08/29/2019 0947   TRIG 388 (H) 08/02/2014 0408   HDL 34 (L) 08/29/2019 0947   HDL 34 (L) 08/02/2014 0408   CHOLHDL 5.9 (H) 08/29/2019 0947   CHOLHDL 6  12/24/2018 1227   VLDL 61.4 (H) 12/24/2018 1227   VLDL 78 (H) 08/02/2014 0408   LDLCALC 131 (H) 08/29/2019 0947   LDLCALC 180 (H) 08/02/2014 0408   LDLDIRECT 143.0 12/24/2018 1227   LABVLDL 35 08/29/2019 0947    LDL goal < 70 Patient has failed these meds in past: none Patient is currently uncontrolled on the following medications:   Rosuvastatin 40 mg - 1 tablet daily (denies taking currently)  Zetia 10 mg - 1 tablet daily (denies taking currently)  Aspirin 81 mg - 1 tablet daily  Clopidogrel 75 mg - 1 tablet daily (denies taking currently)  Isosorbide mononitrate 30 mg - 1 tablet daily   We discussed: pt realized she is out of rosuvastatin, zetia, and plavix - reports she needs new prescriptions sent to [place]  Plan: Continue current medications; Resume rosuvastatin, Zetia and Plavix as soon as possible. Will request refills.   Hypertension   CMP Latest Ref Rng &  Units 08/29/2019 01/17/2019 12/24/2018  Glucose 65 - 99 mg/dL 284(H) 134(H) 92  BUN 8 - 27 mg/dL 15 16 19   Creatinine 0.57 - 1.00 mg/dL 1.11(H) 0.92 1.05  Sodium 134 - 144 mmol/L 138 137 137  Potassium 3.5 - 5.2 mmol/L 4.7 4.2 3.9  Chloride 96 - 106 mmol/L 99 103 100  CO2 20 - 29 mmol/L 24 23 31   Calcium 8.7 - 10.3 mg/dL 9.6 9.2 9.7  Total Protein 6.0 - 8.5 g/dL 6.3 - 7.1  Total Bilirubin 0.0 - 1.2 mg/dL 0.4 - 0.4  Alkaline Phos 39 - 117 IU/L 91 - 80  AST 0 - 40 IU/L 10 - 12  ALT 0 - 32 IU/L 14 - 10   Office blood pressures are: BP Readings from Last 3 Encounters:  08/29/19 130/80  04/25/19 136/76  03/21/19 (!) 150/78   BP goal < 140/90 mmHg  Patient has failed these meds in the past: none Patient checks BP at home: 2-3 days a week Patient home BP readings are ranging: 120-130/90 mmHg  Patient is currently controlled on the following medications:   Enalapril 20 mg - 1 tablet daily   Carvedilol 6.25 mg - 1 tablet BID  We discussed: reports taking carvedilol 2 tablets in the morning rather than BID due to  forgetting second dose   Plan: Continue current medications  COPD / Tobacco   Last spirometry score: 03/2013 (pre) FEV1 60% Gold Grade: Gold 2 (FEV1 50-79%) Current COPD Classification:  A (low sx, <2 exacerbations/yr)  Eosinophil count:   Lab Results  Component Value Date/Time   EOSPCT 1.4 06/02/2016 11:37 AM   EOSPCT 0.8 08/02/2014 04:08 AM  %                               Eos (Absolute):  Lab Results  Component Value Date/Time   EOSABS 0.1 06/02/2016 11:37 AM   EOSABS 0.0 08/02/2014 04:08 AM   Tobacco Status:  Social History   Tobacco Use  Smoking Status Current Every Day Smoker  . Packs/day: 1.00  . Years: 45.00  . Pack years: 45.00  . Types: Cigarettes  Smokeless Tobacco Never Used  Tobacco Comment   Has cut back, trying to quit.    Symptoms: denies SOB as long as not walking a long distance or upstairs, limits ability to exercise, denies cough Recent hospitalizations: denies   Patient has failed these meds in past: reports used to be on Spiriva and albuterol - ran out and never restarted, open to trying again Patient is currently controlled on the following medications:   No pharmacotherapy  Chantix 1 mg - BID (reports was not covered by insurance - did not start)  Tobacco use: Current - 1 PPD; Electronic cigarettes - uses this around others when outside of the house   We discussed: Unable to exercise due to SOB; encouraged calling 1-800 QUIT NOW for counseling on quit date, patient remains interested in Chantix, reports she never started it due to cost/not covered by her insurance, she would like the Chantix prescription sent to the [place], discouraged replacing cigarettes with e-cigarette use  Plan: Will assess COPD/CAT score at follow up. Consult with Sarah Payne to see if she would like to send Chantix prescription to [place].   Diabetes   Recent Relevant Labs: Lab Results  Component Value Date/Time   HGBA1C 9.0 (A) 04/25/2019 09:06 AM   HGBA1C 10.3 (H) 12/24/2018  12:27 PM  HGBA1C 13.1 (A) 08/25/2018 11:25 AM   HGBA1C 10.4 (H) 09/16/2016 11:53 AM   HGBA1C 10.9 (H) 08/02/2014 04:08 AM   HGBA1C 7.3 (H) 08/19/2013 12:19 PM   MICROALBUR 1.2 05/30/2013 04:48 PM    Checking BG: 3-5 times per day [facility])  Recent FBG Readings: 202, 141, 127, 159, 210, 185, 207, 185, 145, 355 (today - just coffee with 3 packs of splenda- confirms taking Lantus last night) Recent pre-meal BG readings: 118,  Recent 2hr PP BG readings:  210, 207, 58 (after lunch), 75 (after lunch), 188, 115, 175, 116, 180, 58 (after lunch), 119, 148, 170 Recent HS BG readings: none reported Hypoglycemia:   About once weekly around 3-4 PM, jitters - drinks glass of orange juice  3 AM - 38 (3 weeks ago) - reports this has happened 3-4 times in the past 3 months   Patient has tried these meds in past: metformin (diarrhea), glipizide, Januvia - unknown, Trulicity - insurance coverage issue Patient is currently uncontrolled on the following medications:   Novolog - 22 units before meals   Lantus - 60 units qhs   Freestyle Libre  Last diabetic eye exam:  Lab Results  Component Value Date/Time   HMDIABEYEEXA done, [employer] 03/30/2013 12:00 AM    Last diabetic foot exam:  Lab Results  Component Value Date/Time   HMDIABFOOTEX abnormal 05/30/2013 12:00 AM    We discussed: most days only eats 2 meals (lunch and dinner), if sugar is 200 in the morning - will go ahead and take Novolog even if skipping breakfast; takes Novolog right after lunch and dinner usually, but will check her BG before the meal and if < 150, will not take Novolog and wait until 2 hours after to check and take Novolog; If BG is < 150 after dinner, skips Lantus, but reports taking it most nights --> counseled extensively on skipping Novolog if not eating and only taking Novolog before meals, not waiting 2 hours after  Diet:  Lunch: salad or sandwich  Dinner: grilled meat and 2 vegetables (creamed potatoes,  rice) with biscuits or cornbread   Denies much appetite, eats small meals  Plan: Continue current medications; Recommend reducing mealtime insulin from 22 to 20 units as instructed by PCP due to afternoon and overnight hypoglycemia; strongly recommend avoiding Novolog if not eating and only taking before or right after eating if she forgets beforehand; follow up in 2 weeks   Heart Failure   Type: Systolic  Last ejection fraction: 03/2016 60-65% NYHA Class: II (slight limitation of activity) AHA HF Stage: C (Heart disease and symptoms present)  Patient has failed these meds in past: none reported Patient is currently controlled on the following medications:   Enalapril 20 mg - 1 tablet daily   Carvedilol 6.25 mg - 1 tablet BID  We discussed: denies swelling in legs, SOB stable, followed by cardiology  Plan: Continue current medications  Hypothyroidism   TSH  Date Value Ref Range Status  01/17/2019 1.43 0.35 - 4.50 uIU/mL Final    Patient has failed these meds in past: none Patient is currently controlled on the following medications:   Euthyrox 112 mcg - 1 tablet in the morning on empty stomach  We discussed: takes 30 minutes before all medications  Plan: Continue current medications  Depression/Insomnia   Patient has failed these meds in past: none  Patient is currently controlled on the following medications:   Trazodone 100mg  - 1 tablet qhs PRN  We discussed:  takes PRN sleep, not every night, sleeping well   Plan: Continue current medications  Neuropathy   Patient has failed these meds in past: none reported Patient is currently controlled on the following medications:   Amitriptyline 50 mg - 1 tablet qhs  Gabapentin 300 mg - 1 capsule TID   We discussed: working very well, sometimes misses bedtime dose if falls asleep prior to taking, usually only takes gabapentin BID   Plan: Continue current medications  Vaccines   Reviewed and discussed patient's  vaccination history.    Immunization History  Administered Date(s) Administered  . Influenza Split 05/20/2012  . Influenza, High Dose Seasonal PF 04/17/2016, 04/17/2017, 05/04/2018  . Influenza, Seasonal, Injecte, Preservative Fre 05/07/2006  . Influenza,inj,Quad PF,6+ Mos 04/20/2013, 04/26/2014, 06/01/2015, 04/25/2019  . PFIZER SARS-COV-2 Vaccination 09/15/2019, 10/11/2019  . Pneumococcal Conjugate-13 04/26/2014  . Pneumococcal Polysaccharide-23 03/28/2013, 04/25/2019  . Tdap 03/28/2013   Plan: Recommended patient receive Shingrix vaccine. Pt declines interest due to low concern about shingles.  Medication Management  Misc: Nystatin cream - BID  OTCs: denies   Pharmacy/Benefits: [facility] and [facility]   Adherence: denies missed doses, fills 4 week pillbox  Affordability: denies concerns, medications are free through [place] Follow Up:  11/11/19 at 8:30 AM (telephone)  [name], PharmD Clinical Pharmacist [facility] at [facility] [phone]

## 2019-10-28 NOTE — Telephone Encounter (Signed)
Message left for patient to return my call.  

## 2019-10-28 NOTE — Telephone Encounter (Signed)
Spoken to patient and she is not sure what do. She thought that her primary Sarah Payne was refilling these Rx. She did not realized that Dr Donivan Scull office refill them.  Patient asked can Sarah Payne sent refill them to the [place]? Please advise.

## 2019-10-28 NOTE — Telephone Encounter (Signed)
See other phone note

## 2019-10-31 NOTE — Telephone Encounter (Signed)
Sarah Payne, will you get her scheduled? Raquel Sarna is out today. Would like to see her this week.

## 2019-10-31 NOTE — Telephone Encounter (Signed)
Spoken and notified patient of [name]'s comments. Patient verbalized understanding.  

## 2019-10-31 NOTE — Telephone Encounter (Signed)
Spoken and notified patient of Sarah Payne comments. Patient verbalized understanding.  Appointment schedule on 11/02/2019

## 2019-11-01 ENCOUNTER — Encounter: Payer: Medicare HMO | Admitting: Student in an Organized Health Care Education/Training Program

## 2019-11-02 ENCOUNTER — Encounter: Payer: Self-pay | Admitting: Primary Care

## 2019-11-02 ENCOUNTER — Ambulatory Visit (INDEPENDENT_AMBULATORY_CARE_PROVIDER_SITE_OTHER): Payer: Medicare PPO | Admitting: Primary Care

## 2019-11-02 ENCOUNTER — Other Ambulatory Visit: Payer: Self-pay

## 2019-11-02 VITALS — BP 116/72 | HR 78 | Temp 96.5°F | Ht 66.0 in | Wt 148.1 lb

## 2019-11-02 DIAGNOSIS — E1151 Type 2 diabetes mellitus with diabetic peripheral angiopathy without gangrene: Secondary | ICD-10-CM | POA: Diagnosis not present

## 2019-11-02 DIAGNOSIS — I10 Essential (primary) hypertension: Secondary | ICD-10-CM

## 2019-11-02 DIAGNOSIS — E039 Hypothyroidism, unspecified: Secondary | ICD-10-CM | POA: Diagnosis not present

## 2019-11-02 DIAGNOSIS — L900 Lichen sclerosus et atrophicus: Secondary | ICD-10-CM

## 2019-11-02 DIAGNOSIS — E782 Mixed hyperlipidemia: Secondary | ICD-10-CM | POA: Diagnosis not present

## 2019-11-02 DIAGNOSIS — I5022 Chronic systolic (congestive) heart failure: Secondary | ICD-10-CM

## 2019-11-02 DIAGNOSIS — N949 Unspecified condition associated with female genital organs and menstrual cycle: Secondary | ICD-10-CM

## 2019-11-02 DIAGNOSIS — I2510 Atherosclerotic heart disease of native coronary artery without angina pectoris: Secondary | ICD-10-CM

## 2019-11-02 LAB — POC URINALSYSI DIPSTICK (AUTOMATED)
Bilirubin, UA: NEGATIVE
Blood, UA: NEGATIVE
Glucose, UA: POSITIVE — AB
Ketones, UA: NEGATIVE
Leukocytes, UA: NEGATIVE
Nitrite, UA: NEGATIVE
Protein, UA: NEGATIVE
Spec Grav, UA: 1.015 (ref 1.010–1.025)
Urobilinogen, UA: 0.2 E.U./dL
pH, UA: 6 (ref 5.0–8.0)

## 2019-11-02 LAB — POCT GLYCOSYLATED HEMOGLOBIN (HGB A1C): Hemoglobin A1C: 11.2 % — AB (ref 4.0–5.6)

## 2019-11-02 MED ORDER — ENALAPRIL MALEATE 20 MG PO TABS: ORAL_TABLET | ORAL | 3 refills | Status: DC

## 2019-11-02 MED ORDER — NOVOLOG FLEXPEN 100 UNIT/ML ~~LOC~~ SOPN: PEN_INJECTOR | SUBCUTANEOUS | 5 refills | Status: DC

## 2019-11-02 MED ORDER — CLOPIDOGREL BISULFATE 75 MG PO TABS: 75.0000 mg | ORAL_TABLET | Freq: Every day | ORAL | 3 refills | Status: DC

## 2019-11-02 MED ORDER — ROSUVASTATIN CALCIUM 40 MG PO TABS: 40.0000 mg | ORAL_TABLET | Freq: Every evening | ORAL | 3 refills | Status: DC

## 2019-11-02 MED ORDER — EZETIMIBE 10 MG PO TABS: 10.0000 mg | ORAL_TABLET | Freq: Every day | ORAL | 3 refills | Status: DC

## 2019-11-02 MED ORDER — LANTUS SOLOSTAR 100 UNIT/ML ~~LOC~~ SOPN: 25.0000 [IU] | PEN_INJECTOR | Freq: Two times a day (BID) | SUBCUTANEOUS | 5 refills | Status: DC

## 2019-11-02 MED ORDER — NOVOLOG FLEXPEN 100 UNIT/ML ~~LOC~~ SOPN: PEN_INJECTOR | SUBCUTANEOUS | 0 refills | Status: DC

## 2019-11-02 MED ORDER — ISOSORBIDE MONONITRATE ER 30 MG PO TB24: 30.0000 mg | ORAL_TABLET | Freq: Every day | ORAL | 3 refills | Status: DC

## 2019-11-02 MED ORDER — ESTRADIOL 0.1 MG/GM VA CREA: 1.0000 | TOPICAL_CREAM | VAGINAL | 2 refills | Status: DC

## 2019-11-02 NOTE — Telephone Encounter (Signed)
Patient notified and verbalized understanding that these prescriptions have been sent in.

## 2019-11-02 NOTE — Assessment & Plan Note (Signed)
Out of Crestor and Zetia for 2 months, refills provided today. Repeat lipids next visit.

## 2019-11-02 NOTE — Assessment & Plan Note (Signed)
Stable in the office today, refills provided for enalapril. Discussed to start carvedilol BID, she verbalized understanding.   Repeat BMP next visit.

## 2019-11-02 NOTE — Patient Instructions (Addendum)
Start the estrace cream, apply three times weekly for vaginal burning.  I sent these medications to [facility] mail order:  Rosuvastatin (Crestor) - cholesterol  Ezetimibe (Zetia) - cholesterol  Enalapril - blood pressure Clopidogrel (Plavix) - heart  Divide your Lantus to 25 units in the morning and 25 units in the evening. Continue Novolog 20 units before lunch. Increase Novolog to 25 units before dinner.  Reduce your consumption of Pepsi.   Please schedule a follow up appointment in 3 months for diabetes check.  It was a pleasure to see you today!

## 2019-11-02 NOTE — Assessment & Plan Note (Signed)
Appears euvolemic. Discussed to increase carvedilol to BID dosing, she verbalized understanding.

## 2019-11-02 NOTE — Progress Notes (Signed)
Subjective:    Patient ID: Sarah Payne, female    DOB: 25-Oct-1945, 74 y.o.   MRN: [id number]  HPI  This visit occurred during the SARS-CoV-2 public health emergency.  Safety protocols were in place, including screening questions prior to the visit, additional usage of staff PPE, and extensive cleaning of exam room while observing appropriate contact time as indicated for disinfecting solutions.   Sarah Payne is a 74 year old female with a history of CAD, uncontrolled type 2 diabetes, CHF, hypertension, takotsubo cardiomyopathy, COPD, sleep apnea, hypothyroidism, depression, hyperlipidemia, anxiety and depression who presents today for follow up of diabetes and medication reconciliation.   1) Type 2 Diabetes:   Current medications include: Lantus 50 units HS, Novolog 20 units TID with meals. She is not taking her morning dose of Novolog as she never eats breakfast. She is running low on her Novolog supply and is needing refills immediately.   She is checking her blood glucose 4-5 times daily and is getting readings of:  AM fasting: 140's Before lunch: 150's 2 hours after lunch: 120-130's Before dinner: 150-160  She's woken up during the night with hypoglycemia symptoms three times within the last several months: Readings of 38, 48, and 58.   Last A1C: 9.0 in October 2020, 11.2 today Last Foot Exam: Due today Pneumonia Vaccination: Completed last in 2020 ACE/ARB: ACE-I Statin: Crestor  She is now back on her Pepsi and is drinking two small cans daily.   2) Medication Reconciliation:  Previously getting medication refills from the mail order pharmacy, has not heard anything back in one month and has therefore not had numerous medications for 1-2 months.  She is very confused about her medication regimen, but she knows she's not taking everything on her list.   She is taking only one of her carvedilol pills daily, not two. Has done this for years. She is compliant to her enalapril  and isosorbide. Also compliant to trazodone, gabapentin, amitriptyline.   She has not taken clopidogrel, Zetia, rosuvastatin for at least two months.  3) Vaginal Itching/Burning: Chronic for years, persistent over the last 8 months. She is using clobetasol cream 1-2 times daily with temporary improvement. During her last visit we completed a Wet Prep which was negative. She did have evidence of vaginal atrophy on exam. History of hysterectomy.  BP Readings from Last 3 Encounters:  11/02/19 116/72  08/29/19 130/80  04/25/19 136/76         Review of Systems  Respiratory: Negative for shortness of breath.   Cardiovascular: Negative for chest pain.  Genitourinary: Positive for frequency.       Vaginal burning and itching  Neurological: Negative for dizziness and headaches.       Past Medical History:  Diagnosis Date  . Cataract    BILATERAL REMOVED  . Chronic low back pain   . COPD (chronic obstructive pulmonary disease) ([place])   . Coronary artery disease    a. multiple PCIs to the mLCx. b. stent to dRCA 08/2011 in setting of NSTEMI. c. DES to prox-mid LCx for ISR 09/2011. d.  DESx2 to prox-mRCA 07/2012. e. Takotsubo event 12/2015 with patent stents.  . Depression   . Diabetes mellitus   . Diverticulosis   . Frequent PVCs    a. Noted in hospital 12/2015.  Marland Kitchen GERD (gastroesophageal reflux disease)   . Hyperlipidemia   . Hypertension   . Hypothyroidism   . Impingement syndrome of left shoulder   . Marijuana abuse   .  Myocardial infarction ([place])    x 5  . Sleep apnea    mild-does not use cpap  . Takotsubo cardiomyopathy    a. 12/2015 - nephew committed suicide 1 week prior, sister died the morning of presentation - initially called a STEMI; cath with patent stents. LVEF 25-30%.  . Tendonitis of left rotator cuff   . Tobacco abuse   . Vascular dementia      Social History   Socioeconomic History  . Marital status: Married    Spouse name: Not on file  . Number of children:  Not on file  . Years of education: Not on file  . Highest education level: Not on file  Occupational History  . Not on file  Tobacco Use  . Smoking status: Current Every Day Smoker    Packs/day: 1.00    Years: 45.00    Pack years: 45.00    Types: Cigarettes  . Smokeless tobacco: Never Used  . Tobacco comment: Has cut back, trying to quit.   Substance and Sexual Activity  . Alcohol use: No    Alcohol/week: 0.0 standard drinks  . Drug use: Yes    Types: Marijuana    Comment: last noc  . Sexual activity: Not on file  Other Topics Concern  . Not on file  Social History Narrative   Lives at home with her husband in [place].  Previously used marijuana - quit.      Regular exercise: no/ pain from a frozen rotator cuff   Caffeine use: coffee daily and pepsi      Does not have a living will.   Daughters and husband know her wishes- would desire CPR but not prolonged life support if futile   Social Determinants of Health   Financial Resource Strain:   . Difficulty of Paying Living Expenses:   Food Insecurity:   . Worried About Charity fundraiser in the Last Year:   . Arboriculturist in the Last Year:   Transportation Needs:   . Film/video editor (Medical):   Marland Kitchen Lack of Transportation (Non-Medical):   Physical Activity:   . Days of Exercise per Week:   . Minutes of Exercise per Session:   Stress:   . Feeling of Stress :   Social Connections:   . Frequency of Communication with Friends and Family:   . Frequency of Social Gatherings with Friends and Family:   . Attends Religious Services:   . Active Member of Clubs or Organizations:   . Attends Archivist Meetings:   Marland Kitchen Marital Status:   Intimate Partner Violence:   . Fear of Current or Ex-Partner:   . Emotionally Abused:   Marland Kitchen Physically Abused:   . Sexually Abused:     Past Surgical History:  Procedure Laterality Date  . ABDOMINAL HYSTERECTOMY    . APPENDECTOMY    . BACK SURGERY    . CARDIAC  CATHETERIZATION  2013  . CARDIAC CATHETERIZATION  2016  . CARDIAC CATHETERIZATION N/A 01/01/2016   Procedure: Left Heart Cath and Coronary Angiography;  Surgeon: Jettie Booze, MD;  Location: [facility];  Service: Cardiovascular;  Laterality: N/A;  . CATARACT EXTRACTION W/PHACO Left 01/30/2015   Procedure: CATARACT EXTRACTION PHACO AND INTRAOCULAR LENS PLACEMENT (IOC);  Surgeon: Birder Robson, MD;  Location: [facility];  Service: Ophthalmology;  Laterality: Left;  [place] 00:47   . CATARACT EXTRACTION W/PHACO Right 02/13/2015   Procedure: CATARACT EXTRACTION PHACO AND INTRAOCULAR LENS PLACEMENT (IOC);  Surgeon: Birder Robson, MD;  Location: [facility];  Service: Ophthalmology;  Laterality: Right;  cassette lot # [id number] H [id number]  00:29.9 AP  20.7 CDE  6.20  . COLONOSCOPY N/A 11/02/2014   Procedure: COLONOSCOPY;  Surgeon: Inda Castle, MD;  Location: [facility];  Service: Endoscopy;  Laterality: N/A;  . CORONARY ANGIOPLASTY  2012   stent x 3   . CORONARY ANGIOPLASTY WITH STENT PLACEMENT  2013  . ESOPHAGOGASTRODUODENOSCOPY (EGD) WITH PROPOFOL N/A 09/21/2018   Procedure: ESOPHAGOGASTRODUODENOSCOPY (EGD) WITH PROPOFOL;  Surgeon: [name], MD;  Location: [facility];  Service: Gastroenterology;  Laterality: N/A;  . SHOULDER SURGERY Left 2017    Family History  Problem Relation Age of Onset  . Heart attack Mother        First MI @ 73 - Died @ 55  . Heart disease Mother   . Heart disease Father        Died @ 38  . Throat cancer Brother   . Liver cancer Brother   . Colon cancer Sister     Allergies  Allergen Reactions  . No Known Allergies     Current Outpatient Medications on File Prior to Visit  Medication Sig Dispense Refill  . amitriptyline (ELAVIL) 50 MG tablet Take 1 tablet (50 mg total) by mouth at bedtime. 30 tablet 5  . aspirin 81 MG tablet Take 81 mg by mouth daily.      . clobetasol ointment (TEMOVATE) 0.05 % APPLY EVERY NIGHT TO VAGINAL FOLDS 30 g 0  .  Continuous Blood Gluc Sensor (FREESTYLE LIBRE 14 DAY SENSOR) MISC 1 each by Does not apply route every 14 (fourteen) days. Change every 2 weeks 6 each 3  . EUTHYROX 112 MCG tablet TAKE 1 TABLET BY MOUTH IN THE MORNING ON  AN  EMPTY  STOMACH.  NO  FOOD  OR  OTHER  MEDICATIONS  FOR  30  MINUTES 90 tablet 1  . gabapentin (NEURONTIN) 300 MG capsule Take 1 capsule (300 mg total) by mouth 3 (three) times daily. 90 capsule 5  . glucose blood (FREESTYLE LITE) test strip Use as instructed to test blood sugar 3 times a day 100 each 5  . Insulin Pen Needle (INSUPEN PEN NEEDLES) 32G X 4 MM MISC Use to inject insulin 4 times daily. 250 each 1  . nystatin cream (MYCOSTATIN) Apply 1 application topically 2 (two) times daily. Apply a thin layer to clean and dry skin for up to 3 weeks. 30 g 0  . traZODone (DESYREL) 100 MG tablet TAKE 1 TABLET BY MOUTH AT BEDTIME AS NEEDED FOR SLEEP 90 tablet 1  . carvedilol (COREG) 6.25 MG tablet Take 1 tablet (6.25 mg total) by mouth 2 (two) times daily. (Patient not taking: Reported on 11/02/2019) 180 tablet 3  . isosorbide mononitrate (IMDUR) 30 MG 24 hr tablet Take 1 tablet (30 mg total) by mouth daily. (Patient not taking: Reported on 11/02/2019) 90 tablet 3   No current facility-administered medications on file prior to visit.    BP 116/72   Pulse 78   Temp (!) 96.5 F (35.8 C) (Temporal)   Ht 5\' 6"  (1.676 m)   Wt 148 lb 1 oz (67.2 kg)   SpO2 98%   BMI 23.90 kg/m    Objective:   Physical Exam  Constitutional: She appears well-nourished.  Cardiovascular: Normal rate and regular rhythm.  Respiratory: Effort normal and breath sounds normal.  Musculoskeletal:     Cervical back: Neck supple.  Skin:  Skin is warm and dry.  Psychiatric: She has a normal mood and affect.           Assessment & Plan:

## 2019-11-02 NOTE — Assessment & Plan Note (Signed)
Compliant to levothyroxine, repeat TSH next visit.

## 2019-11-02 NOTE — Assessment & Plan Note (Signed)
Refills provided for clopidogrel, rosuvastatin.  Repeat lipids next visit as she's been off of medications for 2 months.   Need to gain better control over diabetes, unfortunately she has a long history of uncontrolled A1C results despite endocrinology evaluation.   Asymptomatic. Following with cardiology.

## 2019-11-02 NOTE — Assessment & Plan Note (Signed)
Suspect vaginal atrophy to be the cause for symptoms. Very temporary improvement with clobetasol.  UA today negative except for glucosuria.  Rx for low dose estrogen cream provided.  Discussed to work on diabetes control.  She will update.

## 2019-11-02 NOTE — Addendum Note (Signed)
Addended by: Jacqualin Combes on: 11/02/2019 02:26 PM   Modules accepted: Orders

## 2019-11-02 NOTE — Assessment & Plan Note (Signed)
Uncontrolled and worse since last visit. A1C today of 11.2 which is likely secondary to her soda intake.   Given hypoglycemic episodes during the night, we will split her Lantus to 25 units AM and HS.   Continue Novolog 20 units before lunch, increase Novolog to 25 units before dinner.  Strongly advised she change her diet, stop sodas.  We will plan to see her back in 3 months.

## 2019-11-02 NOTE — Addendum Note (Signed)
Addended by: Loel Dubonnet on: 11/02/2019 01:18 PM   Modules accepted: Orders

## 2019-11-02 NOTE — Telephone Encounter (Signed)
Hi Belenda Cruise,  Thank you for letting me know! I appreciate all of your time helping Miss Spicer.   I have sent a refill of the Imdur to the [facility]. As she was taking it regularly with BP and anginal symptoms well controlled, I would opt to continue.   I will route this to our triage team and ask them to call her to let me know her Imdur refill will be arriving.   Best, Loel Dubonnet, NP

## 2019-11-02 NOTE — Telephone Encounter (Signed)
Vito Berger,  I met with [name] today and cleared up quite a bit of confusion on her end.  She is taking carvedilol once daily, has been doing this for years. I told her to increase to BID.  She has not taken rosuvastatin, Zetia, clopidogrel in 2 months, I refilled these today. I will repeat lipids and CMP at our next visit which will be in three months.   She is compliant to isosorbide mononitrite, I did not refill but she will need refills sent to [facility]. Will you please send if you prefer for her to continue?  I refilled enalapril for which she has been compliant.   Let me know if you have any questions!

## 2019-11-11 ENCOUNTER — Telehealth: Payer: Self-pay

## 2019-11-11 ENCOUNTER — Telehealth: Payer: Medicare PPO

## 2019-11-11 NOTE — Telephone Encounter (Signed)
[facility] - Client Nonclinical Telephone Record AccessNurse Client [place] [facility] - Client Client Site [place] Physician Alma Friendly - NP Contact Type Call Who Is Calling Patient / Member / Family / Caregiver Caller Name Trinidad Phone Number [phone] Patient Name NATALEY DESTEFANIS Patient DOB August 20, 1945 Call Type Message Only Information Provided Reason for Call Request to [facility] Appointment Initial Comment Caller has appt. in the morning she needs to cancel. Additional Comment Disp. Time Disposition Final User 11/10/2019 5:30:26 PM General Information Provided Yes Scharlene Corn Call Closed By: Scharlene Corn Transaction Date/Time: 11/10/2019 5:28:33 PM (ET)

## 2019-11-11 NOTE — Telephone Encounter (Signed)
Attempted to call patient for CCM appointment today at 8:30 AM, unable to reach. Will cancel appointment as patient requested, thank you.  [name], PharmD Clinical Pharmacist [facility] [phone]

## 2020-01-16 ENCOUNTER — Ambulatory Visit (INDEPENDENT_AMBULATORY_CARE_PROVIDER_SITE_OTHER): Payer: Medicare PPO | Admitting: Family Medicine

## 2020-01-16 ENCOUNTER — Encounter: Payer: Self-pay | Admitting: Family Medicine

## 2020-01-16 ENCOUNTER — Other Ambulatory Visit: Payer: Self-pay

## 2020-01-16 VITALS — BP 124/86 | HR 70 | Temp 97.9°F | Ht 66.0 in | Wt 148.5 lb

## 2020-01-16 DIAGNOSIS — M7502 Adhesive capsulitis of left shoulder: Secondary | ICD-10-CM

## 2020-01-16 DIAGNOSIS — I2510 Atherosclerotic heart disease of native coronary artery without angina pectoris: Secondary | ICD-10-CM | POA: Diagnosis not present

## 2020-01-16 DIAGNOSIS — M19012 Primary osteoarthritis, left shoulder: Secondary | ICD-10-CM | POA: Diagnosis not present

## 2020-01-16 DIAGNOSIS — J449 Chronic obstructive pulmonary disease, unspecified: Secondary | ICD-10-CM

## 2020-01-16 DIAGNOSIS — E1151 Type 2 diabetes mellitus with diabetic peripheral angiopathy without gangrene: Secondary | ICD-10-CM | POA: Diagnosis not present

## 2020-01-16 MED ORDER — METHYLPREDNISOLONE ACETATE 40 MG/ML IJ SUSP
80.0000 mg | Freq: Once | INTRAMUSCULAR | Status: AC
  Administered 2020-01-16: 80 mg via INTRA_ARTICULAR

## 2020-01-16 NOTE — Progress Notes (Signed)
Sarah Payne T. Shermaine Rivet, MD, [place]  Primary Care and Sports Medicine [facility] [place], [address]  Phone: [phone]  FAX: [phone]  VANESA RENIER - 74 y.o. female  MRN [id number]  Date of Birth: [birth year]  Date: 01/16/2020  PCP: [name], NP  Referral: [name], NP  Chief Complaint  Patient presents with  . Shoulder Pain    left    This visit occurred during the SARS-CoV-2 public health emergency.  Safety protocols were in place, including screening questions prior to the visit, additional usage of staff PPE, and extensive cleaning of exam room while observing appropriate contact time as indicated for disinfecting solutions.   Subjective:   Sarah Payne is a 74 y.o. very pleasant female patient with Body mass index is 23.97 kg/m. who presents with the following:  She is a pleasant young lady at 30, with older than age appearance who presents with some ongoing left-sided shoulder pain.  I have seen her a couple of times in the last several years with some shoulder pain.  She does have left greater than right shoulder pain, but she still has pain in the right shoulder much of the time.  She has some significant loss of motion, and a deep dull ache in the left shoulder.  The radiological images were independently reviewed by myself in the office and results were reviewed with the patient. My independent interpretation of images: From her January 17, 2019 left-sided shoulder exam there is some significant evidence for moderate amount of glenohumeral arthritis.  She does have some joint space narrowing at the glenohumeral joint as well as some quite large inferior spurring.  She also does have some acromioclavicular osteoarthritis as well.  No fractures or dislocations. Electronically Signed  By: Owens Loffler, MD On: 01/16/2020 11:20 AM EDT   L shoulder frozen shoulder and [place] OA.  Hurting  fairly bad Husband had a stroke and cannot use his l side.  A lot of needed care  Lab Results  Component Value Date   HGBA1C 11.2 (A) 11/02/2019    Doing better with her insulin - about 120, and dropped down very low.   Review of Systems is noted in the HPI, as appropriate   Objective:   BP 124/86   Pulse 70   Temp 97.9 F (36.6 C) (Skin)   Ht 5\' 6"  (1.676 m)   Wt 148 lb 8 oz (67.4 kg)   BMI 23.97 kg/m    GEN: No acute distress; alert,appropriate. PULM: Breathing comfortably in no respiratory distress PSYCH: Normally interactive.   Shoulder: R and L Inspection: No muscle wasting or winging Ecchymosis/edema: neg  AC joint, scapula, clavicle: NT Cervical spine: NT, full ROM Spurling's: neg ABNORMAL SIDE TESTED: Left, but both are impaired UNLESS OTHERWISE NOTED, THE CONTRALATERAL SIDE HAS FULL RANGE OF MOTION. Abduction: 5/5, LIMITED TO 90 DEGREES Flexion: 5/5, LIMITED TO 90 DEGNO ROM  IR, lift-off: 5/5. TESTED AT 90 DEGREES OF ABDUCTION, LIMITED TO 10 DEGREES ER at neutral:  5/5, TESTED AT 90 DEGREES OF ABDUCTION, LIMITED TO 20 DEGREES AC crossover and compression: PAIN Drop Test: neg Empty Can: neg Supraspinatus insertion: NT Bicipital groove: NT ALL OTHER SPECIAL TESTING EQUIVOCAL GIVEN LOSS OF MOTION C5-T1 intact Sensation intact Grip 5/5    Radiology: No results found.  Assessment and Plan:     ICD-10-CM   1. Glenohumeral arthritis, left  M19.012   2.  Diabetes mellitus type 2 with peripheral artery disease (HCC)  E11.51   3. Bronchitis, chronic obstructive (HCC)  J44.9   4. CAD, multiple vessel  I25.10   5. Adhesive capsulitis of left shoulder  M75.02    Fairly significant glenohumeral arthritis on the left and I think this is limiting the patient's overall function and quality of life.  She very well could have some capsular thickening as well which would be playing a role with motion limitation, and she does have poorly controlled diabetes.   Certainly she has some extensive chronic medical problems that are not helping things as well.  [facility] range of motion protocol  Intraarticular Shoulder Aspiration/Injection Procedure Note DANNIE WOOLEN [birth year] Date of procedure: 01/16/2020  Procedure: Large Joint Aspiration / Injection of Shoulder, Intraarticular, L Indications: Pain  Procedure Details Verbal consent was obtained from the patient. Risks including infection explained and contrasted with benefits and alternatives. Patient prepped with Chloraprep and Ethyl Chloride used for anesthesia. An intraarticular shoulder injection was performed using the posterior approach; needle placed into joint capsule without difficulty. The patient tolerated the procedure well and had decreased pain post injection. No complications. Injection: 8 cc of Lidocaine 1% and 2 mL Depo-Medrol 40 mg. Needle: 21 gauge, 2 inch   Follow-up: Follow-up based on pain   Signed,  Kooper Chriswell T. Alynn Ellithorpe, MD   Outpatient Encounter Medications as of 01/16/2020  Medication Sig  . amitriptyline (ELAVIL) 50 MG tablet Take 1 tablet (50 mg total) by mouth at bedtime.  Marland Kitchen aspirin 81 MG tablet Take 81 mg by mouth daily.    . carvedilol (COREG) 6.25 MG tablet Take 1 tablet (6.25 mg total) by mouth 2 (two) times daily.  . clobetasol ointment (TEMOVATE) 0.05 % APPLY EVERY NIGHT TO VAGINAL FOLDS  . clopidogrel (PLAVIX) 75 MG tablet Take 1 tablet (75 mg total) by mouth daily.  . Continuous Blood Gluc Sensor (FREESTYLE LIBRE 14 DAY SENSOR) MISC 1 each by Does not apply route every 14 (fourteen) days. Change every 2 weeks  . enalapril (VASOTEC) 20 MG tablet Take 1 tablet by mouth once daily for blood pressure.  Marland Kitchen estradiol (ESTRACE VAGINAL) 0.1 MG/GM vaginal cream Place 1 Applicatorful vaginally 3 (three) times a week.  . EUTHYROX 112 MCG tablet TAKE 1 TABLET BY MOUTH IN THE MORNING ON  AN  EMPTY  STOMACH.  NO  FOOD  OR  OTHER  MEDICATIONS  FOR  30  MINUTES  . ezetimibe  (ZETIA) 10 MG tablet Take 1 tablet (10 mg total) by mouth daily. For cholesterol.  . gabapentin (NEURONTIN) 300 MG capsule Take 1 capsule (300 mg total) by mouth 3 (three) times daily.  Marland Kitchen glucose blood (FREESTYLE LITE) test strip Use as instructed to test blood sugar 3 times a day  . insulin aspart (NOVOLOG FLEXPEN) 100 UNIT/ML FlexPen Inject 20 units before lunch, and inject 25 units before dinner.  . insulin glargine (LANTUS SOLOSTAR) 100 UNIT/ML Solostar Pen Inject 25 Units into the skin 2 (two) times daily.  . Insulin Pen Needle (INSUPEN PEN NEEDLES) 32G X 4 MM MISC Use to inject insulin 4 times daily.  . isosorbide mononitrate (IMDUR) 30 MG 24 hr tablet Take 1 tablet (30 mg total) by mouth daily.  Marland Kitchen nystatin cream (MYCOSTATIN) Apply 1 application topically 2 (two) times daily. Apply a thin layer to clean and dry skin for up to 3 weeks.  . rosuvastatin (CRESTOR) 40 MG tablet Take 1 tablet (40 mg total) by mouth every  evening. For cholesterol.  . traZODone (DESYREL) 100 MG tablet TAKE 1 TABLET BY MOUTH AT BEDTIME AS NEEDED FOR SLEEP   No facility-administered encounter medications on file as of 01/16/2020.

## 2020-01-16 NOTE — Addendum Note (Signed)
Addended by: Emelia Salisbury C on: 01/16/2020 04:42 PM   Modules accepted: Orders

## 2020-02-01 ENCOUNTER — Other Ambulatory Visit: Payer: Self-pay

## 2020-02-01 ENCOUNTER — Ambulatory Visit (INDEPENDENT_AMBULATORY_CARE_PROVIDER_SITE_OTHER): Payer: Medicare PPO | Admitting: Primary Care

## 2020-02-01 VITALS — BP 126/78 | HR 74 | Temp 95.9°F | Ht 69.0 in | Wt 148.5 lb

## 2020-02-01 DIAGNOSIS — E782 Mixed hyperlipidemia: Secondary | ICD-10-CM

## 2020-02-01 DIAGNOSIS — E039 Hypothyroidism, unspecified: Secondary | ICD-10-CM | POA: Diagnosis not present

## 2020-02-01 DIAGNOSIS — E1151 Type 2 diabetes mellitus with diabetic peripheral angiopathy without gangrene: Secondary | ICD-10-CM | POA: Diagnosis not present

## 2020-02-01 LAB — LIPID PANEL
Cholesterol: 95 mg/dL (ref 0–200)
HDL: 37.9 mg/dL — ABNORMAL LOW (ref >39.00)
LDL Cholesterol: 31 mg/dL (ref 0–99)
NonHDL: 56.62
Total CHOL/HDL Ratio: 2
Triglycerides: 130 mg/dL (ref 0.0–149.0)
VLDL: 26 mg/dL (ref 0.0–40.0)

## 2020-02-01 LAB — BASIC METABOLIC PANEL
BUN: 26 mg/dL — ABNORMAL HIGH (ref 6–23)
CO2: 31 mEq/L (ref 19–32)
Calcium: 9.5 mg/dL (ref 8.4–10.5)
Chloride: 100 mEq/L (ref 96–112)
Creatinine, Ser: 1.06 mg/dL (ref 0.40–1.20)
GFR: 50.97 mL/min — ABNORMAL LOW (ref >60.00)
Glucose, Bld: 218 mg/dL — ABNORMAL HIGH (ref 70–99)
Potassium: 4.3 mEq/L (ref 3.5–5.1)
Sodium: 138 mEq/L (ref 135–145)

## 2020-02-01 LAB — POCT GLYCOSYLATED HEMOGLOBIN (HGB A1C): Hemoglobin A1C: 9.5 % — AB (ref 4.0–5.6)

## 2020-02-01 LAB — TSH: TSH: 0.02 u[IU]/mL — ABNORMAL LOW (ref 0.35–4.50)

## 2020-02-01 NOTE — Progress Notes (Signed)
Subjective:    Patient ID: Sarah Payne, female    DOB: 08-06-1945, 74 y.o.   MRN: [id number]  HPI  This visit occurred during the SARS-CoV-2 public health emergency.  Safety protocols were in place, including screening questions prior to the visit, additional usage of staff PPE, and extensive cleaning of exam room while observing appropriate contact time as indicated for disinfecting solutions.   [name] is a 74 year old female with a history of type 2 diabetes, CAD, OSA, COPD,   Current medications include: Lantus 25 units BID, Novolog 20 units before lunch, 25 units before dinner.   She is actually injecting 50 units of Lantus at night, she is not following instructions as discussed last visit. She is actually injecting 18-22 units of Novolog before lunch and before dinner.   She is checking her blood glucose several times daily and is getting readings of:  AM fasting: 120's Before lunch: low 100's  Before dinner: 120's  Last A1C: 11.2 in April 2021, 9.5 today Last Eye Exam: Due, she will schedule  Last Foot Exam: Due Pneumonia Vaccination: Completed in 2020 ACE/ARB: Enalapril  Statin: Crestor  BP Readings from Last 3 Encounters:  02/01/20 126/78  01/16/20 124/86  11/02/19 116/72   She Is doing better with her diet, reduced Pepsi down to once nightly after dinner. Mostly drinking Pepsi Zero.   Review of Systems  Respiratory: Negative for shortness of breath.   Cardiovascular: Negative for chest pain.  Neurological: Positive for numbness. Negative for dizziness and headaches.       Chronic bilateral lower extremity numbness and tingling.        Past Medical History:  Diagnosis Date  . Acute systolic (congestive) heart failure ([place]) 01/01/2016  . Cataract    BILATERAL REMOVED  . Chronic low back pain   . COPD (chronic obstructive pulmonary disease) ([place])   . Coronary artery disease    a. multiple PCIs to the mLCx. b. stent to dRCA 08/2011 in setting of NSTEMI. c.  DES to prox-mid LCx for ISR 09/2011. d.  DESx2 to prox-mRCA 07/2012. e. Takotsubo event 12/2015 with patent stents.  . Depression   . Diabetes mellitus   . Diverticulosis   . Frequent PVCs    a. Noted in hospital 12/2015.  Marland Kitchen GERD (gastroesophageal reflux disease)   . Hyperlipidemia   . Hypertension   . Hypothyroidism   . Impingement syndrome of left shoulder   . Marijuana abuse   . Myocardial infarction ([place])    x 5  . Sleep apnea    mild-does not use cpap  . Takotsubo cardiomyopathy    a. 12/2015 - nephew committed suicide 1 week prior, sister died the morning of presentation - initially called a STEMI; cath with patent stents. LVEF 25-30%.  . Tendonitis of left rotator cuff   . Tobacco abuse   . Vascular dementia      Social History   Socioeconomic History  . Marital status: Married    Spouse name: Not on file  . Number of children: Not on file  . Years of education: Not on file  . Highest education level: Not on file  Occupational History  . Not on file  Tobacco Use  . Smoking status: Current Every Day Smoker    Packs/day: 1.00    Years: 45.00    Pack years: 45.00    Types: Cigarettes  . Smokeless tobacco: Never Used  . Tobacco comment: Has cut back, trying to  quit.   Vaping Use  . Vaping Use: Never used  Substance and Sexual Activity  . Alcohol use: No    Alcohol/week: 0.0 standard drinks  . Drug use: Yes    Types: Marijuana    Comment: last noc  . Sexual activity: Not on file  Other Topics Concern  . Not on file  Social History Narrative   Lives at home with her husband in [place].  Previously used marijuana - quit.      Regular exercise: no/ pain from a frozen rotator cuff   Caffeine use: coffee daily and pepsi      Does not have a living will.   Daughters and husband know her wishes- would desire CPR but not prolonged life support if futile   Social Determinants of Health   Financial Resource Strain:   . Difficulty of Paying Living Expenses:    Food Insecurity:   . Worried About Charity fundraiser in the Last Year:   . Arboriculturist in the Last Year:   Transportation Needs:   . Film/video editor (Medical):   Marland Kitchen Lack of Transportation (Non-Medical):   Physical Activity:   . Days of Exercise per Week:   . Minutes of Exercise per Session:   Stress:   . Feeling of Stress :   Social Connections:   . Frequency of Communication with Friends and Family:   . Frequency of Social Gatherings with Friends and Family:   . Attends Religious Services:   . Active Member of Clubs or Organizations:   . Attends Archivist Meetings:   Marland Kitchen Marital Status:   Intimate Partner Violence:   . Fear of Current or Ex-Partner:   . Emotionally Abused:   Marland Kitchen Physically Abused:   . Sexually Abused:     Past Surgical History:  Procedure Laterality Date  . ABDOMINAL HYSTERECTOMY    . APPENDECTOMY    . BACK SURGERY    . CARDIAC CATHETERIZATION  2013  . CARDIAC CATHETERIZATION  2016  . CARDIAC CATHETERIZATION N/A 01/01/2016   Procedure: Left Heart Cath and Coronary Angiography;  Surgeon: Jettie Booze, MD;  Location: [facility];  Service: Cardiovascular;  Laterality: N/A;  . CATARACT EXTRACTION W/PHACO Left 01/30/2015   Procedure: CATARACT EXTRACTION PHACO AND INTRAOCULAR LENS PLACEMENT (IOC);  Surgeon: Birder Robson, MD;  Location: [facility];  Service: Ophthalmology;  Laterality: Left;  [place] 00:47   . CATARACT EXTRACTION W/PHACO Right 02/13/2015   Procedure: CATARACT EXTRACTION PHACO AND INTRAOCULAR LENS PLACEMENT (IOC);  Surgeon: Birder Robson, MD;  Location: [facility];  Service: Ophthalmology;  Laterality: Right;  cassette lot # [id number] H [place]  00:29.9 AP  20.7 CDE  6.20  . COLONOSCOPY N/A 11/02/2014   Procedure: COLONOSCOPY;  Surgeon: Inda Castle, MD;  Location: [place];  Service: Endoscopy;  Laterality: N/A;  . CORONARY ANGIOPLASTY  2012   stent x 3   . CORONARY ANGIOPLASTY WITH STENT PLACEMENT  2013  .  ESOPHAGOGASTRODUODENOSCOPY (EGD) WITH PROPOFOL N/A 09/21/2018   Procedure: ESOPHAGOGASTRODUODENOSCOPY (EGD) WITH PROPOFOL;  Surgeon: [name], MD;  Location: [facility];  Service: Gastroenterology;  Laterality: N/A;  . SHOULDER SURGERY Left 2017    Family History  Problem Relation Age of Onset  . Heart attack Mother        First MI @ 39 - Died @ 4  . Heart disease Mother   . Heart disease Father        Died @ 75  .  Throat cancer Brother   . Liver cancer Brother   . Colon cancer Sister     Allergies  Allergen Reactions  . No Known Allergies     Current Outpatient Medications on File Prior to Visit  Medication Sig Dispense Refill  . amitriptyline (ELAVIL) 50 MG tablet Take 1 tablet (50 mg total) by mouth at bedtime. 30 tablet 5  . aspirin 81 MG tablet Take 81 mg by mouth daily.      . carvedilol (COREG) 6.25 MG tablet Take 1 tablet (6.25 mg total) by mouth 2 (two) times daily. 180 tablet 3  . clobetasol ointment (TEMOVATE) 0.05 % APPLY EVERY NIGHT TO VAGINAL FOLDS 30 g 0  . clopidogrel (PLAVIX) 75 MG tablet Take 1 tablet (75 mg total) by mouth daily. 90 tablet 3  . Continuous Blood Gluc Sensor (FREESTYLE LIBRE 14 DAY SENSOR) MISC 1 each by Does not apply route every 14 (fourteen) days. Change every 2 weeks 6 each 3  . enalapril (VASOTEC) 20 MG tablet Take 1 tablet by mouth once daily for blood pressure. 90 tablet 3  . estradiol (ESTRACE VAGINAL) 0.1 MG/GM vaginal cream Place 1 Applicatorful vaginally 3 (three) times a week. 42.5 g 2  . EUTHYROX 112 MCG tablet TAKE 1 TABLET BY MOUTH IN THE MORNING ON  AN  EMPTY  STOMACH.  NO  FOOD  OR  OTHER  MEDICATIONS  FOR  30  MINUTES 90 tablet 1  . ezetimibe (ZETIA) 10 MG tablet Take 1 tablet (10 mg total) by mouth daily. For cholesterol. 90 tablet 3  . gabapentin (NEURONTIN) 300 MG capsule Take 1 capsule (300 mg total) by mouth 3 (three) times daily. 90 capsule 5  . glucose blood (FREESTYLE LITE) test strip Use as instructed to test blood  sugar 3 times a day 100 each 5  . insulin aspart (NOVOLOG FLEXPEN) 100 UNIT/ML FlexPen Inject 20 units before lunch, and inject 25 units before dinner. 15 mL 0  . insulin glargine (LANTUS SOLOSTAR) 100 UNIT/ML Solostar Pen Inject 25 Units into the skin 2 (two) times daily. 15 mL 5  . Insulin Pen Needle (INSUPEN PEN NEEDLES) 32G X 4 MM MISC Use to inject insulin 4 times daily. 250 each 1  . isosorbide mononitrate (IMDUR) 30 MG 24 hr tablet Take 1 tablet (30 mg total) by mouth daily. 90 tablet 3  . nystatin cream (MYCOSTATIN) Apply 1 application topically 2 (two) times daily. Apply a thin layer to clean and dry skin for up to 3 weeks. 30 g 0  . rosuvastatin (CRESTOR) 40 MG tablet Take 1 tablet (40 mg total) by mouth every evening. For cholesterol. 90 tablet 3  . traZODone (DESYREL) 100 MG tablet TAKE 1 TABLET BY MOUTH AT BEDTIME AS NEEDED FOR SLEEP 90 tablet 1   No current facility-administered medications on file prior to visit.    BP 126/78   Pulse 74   Temp (!) 95.9 F (35.5 C) (Temporal)   Ht 5\' 9"  (1.753 m)   Wt 148 lb 8 oz (67.4 kg)   SpO2 97%   BMI 21.93 kg/m    Objective:   Physical Exam Cardiovascular:     Rate and Rhythm: Normal rate and regular rhythm.  Pulmonary:     Effort: Pulmonary effort is normal.     Breath sounds: Normal breath sounds.  Musculoskeletal:     Cervical back: Neck supple.  Skin:    General: Skin is warm and dry.  Assessment & Plan:

## 2020-02-01 NOTE — Assessment & Plan Note (Signed)
Compliant to levothyroxine, taking correctly. Repeat TSH pending.

## 2020-02-01 NOTE — Assessment & Plan Note (Signed)
Compliant to both Crestor and Zetia, repeat lipid panel pending.

## 2020-02-01 NOTE — Patient Instructions (Addendum)
Divide your Lantus (insulin glargine) to 25 units twice daily.  Continue Novolog twice daily, you can do 18-25 units twice daily.  Stop by the lab prior to leaving today. I will notify you of your results once received.   Please schedule a follow up appointment in 3 months for diabetes check.  It was a pleasure to see you today!   Diabetes Mellitus and Nutrition, Adult When you have diabetes (diabetes mellitus), it is very important to have healthy eating habits because your blood sugar (glucose) levels are greatly affected by what you eat and drink. Eating healthy foods in the appropriate amounts, at about the same times every day, can help you:  Control your blood glucose.  Lower your risk of heart disease.  Improve your blood pressure.  Reach or maintain a healthy weight. Every person with diabetes is different, and each person has different needs for a meal plan. Your health care provider may recommend that you work with a diet and nutrition specialist (dietitian) to make a meal plan that is best for you. Your meal plan may vary depending on factors such as:  The calories you need.  The medicines you take.  Your weight.  Your blood glucose, blood pressure, and cholesterol levels.  Your activity level.  Other health conditions you have, such as heart or kidney disease. How do carbohydrates affect me? Carbohydrates, also called carbs, affect your blood glucose level more than any other type of food. Eating carbs naturally raises the amount of glucose in your blood. Carb counting is a method for keeping track of how many carbs you eat. Counting carbs is important to keep your blood glucose at a healthy level, especially if you use insulin or take certain oral diabetes medicines. It is important to know how many carbs you can safely have in each meal. This is different for every person. Your dietitian can help you calculate how many carbs you should have at each meal and for each  snack. Foods that contain carbs include:  Bread, cereal, rice, pasta, and crackers.  Potatoes and corn.  Peas, beans, and lentils.  Milk and yogurt.  Fruit and juice.  Desserts, such as cakes, cookies, ice cream, and candy. How does alcohol affect me? Alcohol can cause a sudden decrease in blood glucose (hypoglycemia), especially if you use insulin or take certain oral diabetes medicines. Hypoglycemia can be a life-threatening condition. Symptoms of hypoglycemia (sleepiness, dizziness, and confusion) are similar to symptoms of having too much alcohol. If your health care provider says that alcohol is safe for you, follow these guidelines:  Limit alcohol intake to no more than 1 drink per day for nonpregnant women and 2 drinks per day for men. One drink equals 12 oz of beer, 5 oz of wine, or 1 oz of hard liquor.  Do not drink on an empty stomach.  Keep yourself hydrated with water, diet soda, or unsweetened iced tea.  Keep in mind that regular soda, juice, and other mixers may contain a lot of sugar and must be counted as carbs. What are tips for following this plan?  Reading food labels  Start by checking the serving size on the "Nutrition Facts" label of packaged foods and drinks. The amount of calories, carbs, fats, and other nutrients listed on the label is based on one serving of the item. Many items contain more than one serving per package.  Check the total grams (g) of carbs in one serving. You can calculate the number  of servings of carbs in one serving by dividing the total carbs by 15. For example, if a food has 30 g of total carbs, it would be equal to 2 servings of carbs.  Check the number of grams (g) of saturated and trans fats in one serving. Choose foods that have low or no amount of these fats.  Check the number of milligrams (mg) of salt (sodium) in one serving. Most people should limit total sodium intake to less than 2,300 mg per day.  Always check the  nutrition information of foods labeled as "low-fat" or "nonfat". These foods may be higher in added sugar or refined carbs and should be avoided.  Talk to your dietitian to identify your daily goals for nutrients listed on the label. Shopping  Avoid buying canned, premade, or processed foods. These foods tend to be high in fat, sodium, and added sugar.  Shop around the outside edge of the grocery store. This includes fresh fruits and vegetables, bulk grains, fresh meats, and fresh dairy. Cooking  Use low-heat cooking methods, such as baking, instead of high-heat cooking methods like deep frying.  Cook using healthy oils, such as olive, canola, or sunflower oil.  Avoid cooking with butter, cream, or high-fat meats. Meal planning  Eat meals and snacks regularly, preferably at the same times every day. Avoid going long periods of time without eating.  Eat foods high in fiber, such as fresh fruits, vegetables, beans, and whole grains. Talk to your dietitian about how many servings of carbs you can eat at each meal.  Eat 4-6 ounces (oz) of lean protein each day, such as lean meat, chicken, fish, eggs, or tofu. One oz of lean protein is equal to: ? 1 oz of meat, chicken, or fish. ? 1 egg. ?  cup of tofu.  Eat some foods each day that contain healthy fats, such as avocado, nuts, seeds, and fish. Lifestyle  Check your blood glucose regularly.  Exercise regularly as told by your health care provider. This may include: ? 150 minutes of moderate-intensity or vigorous-intensity exercise each week. This could be brisk walking, biking, or water aerobics. ? Stretching and doing strength exercises, such as yoga or weightlifting, at least 2 times a week.  Take medicines as told by your health care provider.  Do not use any products that contain nicotine or tobacco, such as cigarettes and e-cigarettes. If you need help quitting, ask your health care provider.  Work with a Social worker or diabetes  educator to identify strategies to manage stress and any emotional and social challenges. Questions to ask a health care provider  Do I need to meet with a diabetes educator?  Do I need to meet with a dietitian?  What number can I call if I have questions?  When are the best times to check my blood glucose? Where to find more information:  American Diabetes Association: diabetes.org  Academy of Nutrition and Dietetics: www.eatright.[employer] of Diabetes and Digestive and Kidney Diseases (NIH): [employer] Summary  A healthy meal plan will help you control your blood glucose and maintain a healthy lifestyle.  Working with a diet and nutrition specialist (dietitian) can help you make a meal plan that is best for you.  Keep in mind that carbohydrates (carbs) and alcohol have immediate effects on your blood glucose levels. It is important to count carbs and to use alcohol carefully. This information is not intended to replace advice given to you by your health care provider.  Make sure you discuss any questions you have with your health care provider. Document Revised: 06/19/2017 Document Reviewed: 08/11/2016 Elsevier Patient Education  2020 [employer].

## 2020-02-01 NOTE — Assessment & Plan Note (Signed)
A1C of 9.5 today which is an improvement from 11.2 in April 2021.   She is not following instructions in regards to Lantus, still injecting 50 units at night. Discussed to divide dose to 25 units BID rather than 50 units HS. Continue Novolog, discussed to adjust between 18-25 units BID.  Continue to work on diet.  Compliant to statin therapy and ARB. Foot exam today. She will schedule eye exam.  Follow up in 3 months.

## 2020-02-02 ENCOUNTER — Other Ambulatory Visit: Payer: Self-pay | Admitting: Primary Care

## 2020-02-02 DIAGNOSIS — E039 Hypothyroidism, unspecified: Secondary | ICD-10-CM

## 2020-02-08 ENCOUNTER — Other Ambulatory Visit: Payer: Self-pay

## 2020-02-08 ENCOUNTER — Other Ambulatory Visit (INDEPENDENT_AMBULATORY_CARE_PROVIDER_SITE_OTHER): Payer: Medicare PPO

## 2020-02-08 DIAGNOSIS — E039 Hypothyroidism, unspecified: Secondary | ICD-10-CM | POA: Diagnosis not present

## 2020-02-08 LAB — TSH: TSH: 0.02 u[IU]/mL — ABNORMAL LOW (ref 0.35–4.50)

## 2020-02-09 ENCOUNTER — Other Ambulatory Visit: Payer: Self-pay | Admitting: Primary Care

## 2020-02-09 DIAGNOSIS — E039 Hypothyroidism, unspecified: Secondary | ICD-10-CM

## 2020-02-09 MED ORDER — EUTHYROX 100 MCG PO TABS: ORAL_TABLET | ORAL | 1 refills | Status: DC

## 2020-02-23 ENCOUNTER — Encounter: Payer: Medicare PPO | Admitting: Student in an Organized Health Care Education/Training Program

## 2020-02-25 NOTE — Progress Notes (Signed)
Cardiology Office Note  Date:  02/27/2020   ID:  Sarah Payne, Sarah Payne [birth year], MRN [id number]  PCP:  [name], NP   Chief Complaint  Patient presents with  . Other    6 month follow up. 6 month follow up. Meds reviewed verbally with patient.     HPI:  74 year old woman with a long history of  smoking,  coronary artery disease,  prior stent x3 placed to the mid left circumflex with moderate to severe LAD disease ,  stent placed to her distal RCA in February 2013,  stent to the mid left circumflex in March 2013 ,  poorly controlled diabetes with hemoglobin A1c of 10,  depression,  hypertension,  cardiac catheterization 08/19/2012 showing moderate mid LAD disease at the takeoff of the diagonal vessel, severe ostial to mid RCA disease, ejection fraction greater than 55%. She had FFR pressure wire showing severe disease of the RCA with drug-eluting stent x2 placed. C. difficile late 2015 after antibiotics Previous history of running out of her medications cardiac catheterization performed showing patent stents 12/2015 Postop procedure complication of groin hematoma She presents today for follow-up of her coronary artery disease,   Last seen in clinic by myself October 2019 At that time was having stress secondary to her husband Seen by one of our providers in the office February 2021, records reviewed Still smoking at the time Chronic stable shortness of breath from COPD Chronic uncontrolled diabetes Lower extremity arterial Doppler was ordered for my extremity pain on the right.  The left  Recent studies reviewed in detail Extremity arterial Doppler ABIs normal bilaterally  Some SOB with exertion, chronic issue Feels it is from her COPD Still smoking 1 ppd chronic cough, chronic bronchitis  Lab work reviewed in detail HBA1c 9.5 Down from 31 Having eye problems  Just had teeth pulled  Previously, Husband a major stressor,  EKG personally reviewed by myself on  todays visit nsr rate 78 bpm ST and T wave ABn I and AVL, Unchanged  Other past medical history reviewed At baseline with Chronic severe back pain, Seen by neurosurgery.  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    PMH:   has a past medical history of Acute systolic (congestive) heart failure ([place]) (01/01/2016), Cataract, Chronic low back pain, COPD (chronic obstructive pulmonary disease) ([place]), Coronary artery disease, Depression, Diabetes mellitus, Diverticulosis, Frequent PVCs, GERD (gastroesophageal reflux disease), Hyperlipidemia, Hypertension, Hypothyroidism, Impingement syndrome of left shoulder, Marijuana abuse, Myocardial infarction [facility]), Sleep apnea,  Takotsubo cardiomyopathy, Tendonitis of left rotator cuff, Tobacco abuse, and Vascular dementia.  PSH:    Past Surgical History:  Procedure Laterality Date  . ABDOMINAL HYSTERECTOMY    . APPENDECTOMY    . BACK SURGERY    . CARDIAC CATHETERIZATION  2013  . CARDIAC CATHETERIZATION  2016  . CARDIAC CATHETERIZATION N/A 01/01/2016   Procedure: Left Heart Cath and Coronary Angiography;  Surgeon: Jettie Booze, MD;  Location: [place] [facility];  Service:  Cardiovascular;  Laterality: N/A;  . CATARACT EXTRACTION W/PHACO Left 01/30/2015   Procedure: CATARACT EXTRACTION PHACO AND INTRAOCULAR LENS PLACEMENT (IOC);  Surgeon: Birder Robson, MD;  Location: [facility] ORS;   Service: Ophthalmology;  Laterality: Left;  [place] 00:47   . CATARACT EXTRACTION W/PHACO Right 02/13/2015   Procedure: CATARACT EXTRACTION PHACO AND INTRAOCULAR LENS PLACEMENT (IOC);  Surgeon: Birder Robson, MD;  Location: [facility] OR[facility];  Service: Ophthalmology;  Laterality: Right;  cassette lot # [id number] H [place]  00:29.9 AP  20.7 CDE  6.20  . COLONOSCOPY N/A 11/02/2014   Procedure: COLONOSCOPY;  Surgeon: Inda Castle, MD;  Location: [place];  Service: Endoscopy;  Laterality: N/A;  . CORONARY ANGIOPLASTY  2012   stent x 3   . CORONARY ANGIOPLASTY WITH STENT PLACEMENT  2013  . ESOPHAGOGASTRODUODENOSCOPY (EGD) WITH PROPOFOL N/A 09/21/2018   Procedure: ESOPHAGOGASTRODUODENOSCOPY (EGD) WITH PROPOFOL;  Surgeon: [name], MD;  [facility];  Service: Gastroenterology;  Laterality: N/A;  . SHOULDER SURGERY Left 2017    Current Outpatient Medications  Medication Sig Dispense Refill  . amitriptyline (ELAVIL) 50 MG tablet Take 1 tablet (50 mg total) by mouth at bedtime. 30 tablet 5  . aspirin 81 MG tablet Take 81 mg by mouth daily.      . carvedilol (COREG) 6.25 MG tablet Take 1 tablet (6.25 mg total) by mouth 2 (two) times daily. 180 tablet 3  . clobetasol ointment (TEMOVATE) 0.05 % APPLY EVERY NIGHT TO VAGINAL FOLDS 30 g 0  . clopidogrel (PLAVIX) 75 MG tablet Take 1 tablet (75 mg total) by mouth daily. 90 tablet 3  . Continuous Blood Gluc Sensor (FREESTYLE LIBRE 14 DAY SENSOR) MISC 1 each by Does not apply route every 14 (fourteen) days. Change every 2 weeks 6 each 3  . enalapril (VASOTEC) 20 MG tablet Take 1 tablet by mouth once daily for blood pressure. 90 tablet 3  . estradiol (ESTRACE VAGINAL) 0.1 MG/GM vaginal cream Place 1 Applicatorful vaginally 3 (three) times a week. 42.5 g 2  . EUTHYROX 100 MCG tablet Take 1 tablet by mouth every morning on an empty stomach with water only.  No food or other medications for 30 minutes. 30 tablet 1  . ezetimibe (ZETIA) 10 MG tablet Take 1 tablet  (10 mg total) by mouth daily. For cholesterol. 90 tablet 3  . glucose blood (FREESTYLE LITE) test strip Use as instructed to test blood sugar 3 times a day 100 each 5  . insulin aspart (NOVOLOG FLEXPEN) 100 UNIT/ML FlexPen Inject 20 units before lunch, and inject 25 units before dinner. 15 mL 0  . insulin glargine (LANTUS SOLOSTAR) 100 UNIT/ML Solostar Pen Inject 25 Units into the skin 2 (two) times daily. 15 mL 5  . Insulin Pen Needle (INSUPEN PEN NEEDLES) 32G X 4 MM MISC Use to inject insulin 4 times daily. 250 each 1  . isosorbide mononitrate (IMDUR) 30 MG 24 hr tablet Take 1 tablet (30 mg total) by mouth daily. 90 tablet 3  . nystatin cream (MYCOSTATIN) Apply 1 application topically 2 (two) times daily. Apply a thin layer to clean and dry skin for up to 3 weeks. 30 g 0  . rosuvastatin (CRESTOR) 40 MG tablet Take 1 tablet (40 mg total) by mouth every evening. For cholesterol. 90 tablet 3  . traZODone (DESYREL) 100 MG tablet TAKE 1 TABLET BY MOUTH AT BEDTIME AS NEEDED FOR SLEEP 90 tablet 1  . gabapentin (NEURONTIN) 300 MG capsule Take 1 capsule (300 mg total) by  mouth 3 (three) times daily. 90 capsule 5   No current facility-administered medications for this visit.     Allergies:   No known allergies   Social History:  The patient  reports that she has been smoking cigarettes. She has a 45.00 pack-year smoking history. She has never used smokeless tobacco. She reports current drug use. Drug: Marijuana. She reports that she does not drink alcohol.   Family History:   family history includes Colon cancer in her sister; Heart attack in her mother; Heart disease in her father and mother; Liver cancer in her brother; Throat cancer in her brother.    Review of Systems: Review of Systems  Constitutional: Negative.   HENT: Negative.   Respiratory: Negative.   Cardiovascular: Negative.   Gastrointestinal: Negative.   Musculoskeletal: Negative.   Neurological: Negative.    Psychiatric/Behavioral: Negative.   All other systems reviewed and are negative.    PHYSICAL EXAM: VS:  BP 124/84 (BP Location: Left Arm, Patient Position: Sitting, Cuff Size: Normal)   Pulse 78   Ht 5\' 6"  (1.676 m)   Wt 146 lb (66.2 kg)   BMI 23.57 kg/m  , BMI Body mass index is 23.57 kg/m. Constitutional:  oriented to person, place, and time. No distress.  HENT:  Head: Grossly normal Eyes:  no discharge. No scleral icterus.  Neck: No JVD, no carotid bruits  Cardiovascular: Regular rate and rhythm, no murmurs appreciated Pulmonary/Chest: Clear to auscultation bilaterally, no wheezes or rails Abdominal: Soft.  no distension.  no tenderness.  Musculoskeletal: Normal range of motion Neurological:  normal muscle tone. Coordination normal. No atrophy Skin: Skin warm and dry Psychiatric: normal affect, pleasant  Recent Labs: 08/29/2019: ALT 14 02/01/2020: BUN 26; Creatinine, Ser 1.06; Potassium 4.3; Sodium 138 02/08/2020: TSH 0.02    Lipid Panel Lab Results  Component Value Date   CHOL 95 02/01/2020   HDL 37.90 (L) 02/01/2020   LDLCALC 31 02/01/2020   TRIG 130.0 02/01/2020     Wt Readings from Last 3 Encounters:  02/27/20 146 lb (66.2 kg)  02/01/20 148 lb 8 oz (67.4 kg)  01/16/20 148 lb 8 oz (67.4 kg)     ASSESSMENT AND PLAN:  CAD, multiple vessel -  Currently with no symptoms of angina. No further workup at this time. Continue current medication regimen. Long discussion concerning smoking cessation, better diabetes control  Mixed hyperlipidemia -  Stay on Crestor with Zetia, goal LDL less than 70  Diabetes mellitus type 2 with peripheral artery disease ([place]) -  Reports she is no longer working with endocrine She is taking some changes to her diet, A1c improved but still not at goal Numbers discussed with her in detail  Chronic systolic congestive heart failure (H[facility]) -  on ACE inhibitor, Imdur, carvedilol  Medications refilled, appears euvolemic  Essential  hypertension - Plan: EKG 12-Lead, VAS US CAROTID Blood pressure is well controlled on today's visit. No changes made to the medications.  Stable  Tobacco abuse - Plan: EKG 12-Lead, VAS US CAROTID Still smoking .  Discussed with her again today She does not want Chantix  She is using a vapor cigarette, encouraged her to quit  COPD, moderate ([place]) - Plan: EKG 12-Lead, VAS US CAROTID Still smoking, likely with chronic bronchitis given cough today Started on albuterol inhaler at her request   Total encounter time more than 25 minutes  Greater than 50% was spent in counseling and coordination of care with the patient   No orders of  the defined types were placed in this encounter.    Signed, Esmond Plants, M.D., Ph.D. 02/27/2020  [place], [place]

## 2020-02-27 ENCOUNTER — Other Ambulatory Visit: Payer: Self-pay

## 2020-02-27 ENCOUNTER — Ambulatory Visit (INDEPENDENT_AMBULATORY_CARE_PROVIDER_SITE_OTHER): Payer: Medicare PPO | Admitting: Cardiovascular Disease

## 2020-02-27 ENCOUNTER — Encounter: Payer: Self-pay | Admitting: Cardiovascular Disease

## 2020-02-27 VITALS — BP 124/84 | HR 78 | Ht 66.0 in | Wt 146.0 lb

## 2020-02-27 DIAGNOSIS — E782 Mixed hyperlipidemia: Secondary | ICD-10-CM

## 2020-02-27 DIAGNOSIS — I739 Peripheral vascular disease, unspecified: Secondary | ICD-10-CM

## 2020-02-27 DIAGNOSIS — J449 Chronic obstructive pulmonary disease, unspecified: Secondary | ICD-10-CM | POA: Diagnosis not present

## 2020-02-27 DIAGNOSIS — Z720 Tobacco use: Secondary | ICD-10-CM | POA: Diagnosis not present

## 2020-02-27 DIAGNOSIS — I25118 Atherosclerotic heart disease of native coronary artery with other forms of angina pectoris: Secondary | ICD-10-CM

## 2020-02-27 DIAGNOSIS — E1151 Type 2 diabetes mellitus with diabetic peripheral angiopathy without gangrene: Secondary | ICD-10-CM

## 2020-02-27 DIAGNOSIS — I5022 Chronic systolic (congestive) heart failure: Secondary | ICD-10-CM

## 2020-02-27 DIAGNOSIS — I10 Essential (primary) hypertension: Secondary | ICD-10-CM | POA: Diagnosis not present

## 2020-02-27 MED ORDER — ALBUTEROL SULFATE HFA 108 (90 BASE) MCG/ACT IN AERS
2.0000 | INHALATION_SPRAY | Freq: Four times a day (QID) | RESPIRATORY_TRACT | 0 refills | Status: DC | PRN
Start: 2020-02-27 — End: 2022-01-14

## 2020-02-27 NOTE — Patient Instructions (Addendum)
Medication Instructions:  Albuterol inhaler as needed  If you need a refill on your cardiac medications before your next appointment, please call your pharmacy.    Lab work: No new labs needed   If you have labs (blood work) drawn today and your tests are completely normal, you will receive your results only by: Marland Kitchen MyChart Message (if you have MyChart) OR . A paper copy in the mail If you have any lab test that is abnormal or we need to change your treatment, we will call you to review the results.   Testing/Procedures: No new testing needed   Follow-Up: At [facility], you and your health needs are our priority.  As part of our continuing mission to provide you with exceptional heart care, we have created designated Provider Care Teams.  These Care Teams include your primary Cardiologist (physician) and Advanced Practice Providers (APPs -  Physician Assistants and Nurse Practitioners) who all work together to provide you with the care you need, when you need it.  . You will need a follow up appointment in 12 months   . Providers on your designated Care Team:   . Murray Hodgkins, NP . Christell Faith, PA-C . [name], PA-C  Any Other Special Instructions Will Be Listed Below (If Applicable).  COVID-19 Vaccine Information can be found at: ShippingScam.co.uk For questions related to vaccine distribution or appointments, please email [email or web address] or call [phone].

## 2020-03-12 DIAGNOSIS — E119 Type 2 diabetes mellitus without complications: Secondary | ICD-10-CM | POA: Diagnosis not present

## 2020-04-02 ENCOUNTER — Encounter: Payer: Self-pay | Admitting: Primary Care

## 2020-04-02 ENCOUNTER — Other Ambulatory Visit: Payer: Self-pay

## 2020-04-02 ENCOUNTER — Ambulatory Visit (INDEPENDENT_AMBULATORY_CARE_PROVIDER_SITE_OTHER): Payer: Medicare PPO | Admitting: Primary Care

## 2020-04-02 VITALS — BP 144/82 | HR 78 | Temp 96.4°F | Ht 66.0 in | Wt 148.0 lb

## 2020-04-02 DIAGNOSIS — R42 Dizziness and giddiness: Secondary | ICD-10-CM | POA: Diagnosis not present

## 2020-04-02 DIAGNOSIS — E039 Hypothyroidism, unspecified: Secondary | ICD-10-CM | POA: Diagnosis not present

## 2020-04-02 DIAGNOSIS — R55 Syncope and collapse: Secondary | ICD-10-CM | POA: Diagnosis not present

## 2020-04-02 DIAGNOSIS — R350 Frequency of micturition: Secondary | ICD-10-CM

## 2020-04-02 DIAGNOSIS — Z23 Encounter for immunization: Secondary | ICD-10-CM | POA: Diagnosis not present

## 2020-04-02 LAB — BASIC METABOLIC PANEL
BUN: 19 mg/dL (ref 6–23)
CO2: 32 mEq/L (ref 19–32)
Calcium: 9.1 mg/dL (ref 8.4–10.5)
Chloride: 99 mEq/L (ref 96–112)
Creatinine, Ser: 1.11 mg/dL (ref 0.40–1.20)
GFR: 48.31 mL/min — ABNORMAL LOW (ref >60.00)
Glucose, Bld: 197 mg/dL — ABNORMAL HIGH (ref 70–99)
Potassium: 4 mEq/L (ref 3.5–5.1)
Sodium: 137 mEq/L (ref 135–145)

## 2020-04-02 LAB — CBC
HCT: 40.1 % (ref 36.0–46.0)
Hemoglobin: 13.7 g/dL (ref 12.0–15.0)
MCHC: 34.2 g/dL (ref 30.0–36.0)
MCV: 94 fl (ref 78.0–100.0)
Platelets: 246 10*3/uL (ref 150.0–400.0)
RBC: 4.27 Mil/uL (ref 3.87–5.11)
RDW: 13.6 % (ref 11.5–15.5)
WBC: 9.8 10*3/uL (ref 4.0–10.5)

## 2020-04-02 LAB — POC URINALSYSI DIPSTICK (AUTOMATED)
Bilirubin, UA: NEGATIVE
Blood, UA: NEGATIVE
Glucose, UA: NEGATIVE
Ketones, UA: NEGATIVE
Leukocytes, UA: NEGATIVE
Nitrite, UA: NEGATIVE
Protein, UA: NEGATIVE
Spec Grav, UA: 1.01 (ref 1.010–1.025)
Urobilinogen, UA: 0.2 E.U./dL
pH, UA: 6 (ref 5.0–8.0)

## 2020-04-02 LAB — TSH: TSH: 0.06 u[IU]/mL — ABNORMAL LOW (ref 0.35–4.50)

## 2020-04-02 NOTE — Assessment & Plan Note (Signed)
Acute for the last 10 days.  ECG today with NSR with rate of 68. Doesn't appear to be different from ECG in August 2021.  Positive orthostatic changes in vital signs from laying to sitting. Suspect a component of this to be from dehydration as she drinks NO water.   UA today negative. Labs pending today including TSH, BMP, CBC.  Discussed to monitor glucose and report readings that are consistently at or above 150.  She is ambulating well in the office, normal neuro exam, and is in no distress today. Await results.

## 2020-04-02 NOTE — Patient Instructions (Addendum)
Start drinking water, you must have water everyday. You need 64 ounces of water daily.  Stop by the lab prior to leaving today. I will notify you of your results once received.   Please update Dr. Donivan Scull office regarding these symptoms. Your blood pressure is dropping with position changes .  Please notify me if your blood sugars remain at or above 150 consistently.  It was a pleasure to see you today!   Influenza (Flu) Vaccine (Inactivated or Recombinant): What You Need to Know 1. Why get vaccinated? Influenza vaccine can prevent influenza (flu). Flu is a contagious disease that spreads around the [place] every year, usually between October and May. Anyone can get the flu, but it is more dangerous for some people. Infants and young children, people 38 years of age and older, pregnant women, and people with certain health conditions or a weakened immune system are at greatest risk of flu complications. Pneumonia, bronchitis, sinus infections and ear infections are examples of flu-related complications. If you have a medical condition, such as heart disease, cancer or diabetes, flu can make it worse. Flu can cause fever and chills, sore throat, muscle aches, fatigue, cough, headache, and runny or stuffy nose. Some people may have vomiting and diarrhea, though this is more common in children than adults. Each year thousands of people in the [place] die from flu, and many more are hospitalized. Flu vaccine prevents millions of illnesses and flu-related visits to the doctor each year. 2. Influenza vaccine CDC recommends everyone 10 months of age and older get vaccinated every flu season. Children 6 months through 22 years of age may need 2 doses during a single flu season. Everyone else needs only 1 dose each flu season. It takes about 2 weeks for protection to develop after vaccination. There are many flu viruses, and they are always changing. Each year a new flu vaccine is made to  protect against three or four viruses that are likely to cause disease in the upcoming flu season. Even when the vaccine doesn't exactly match these viruses, it may still provide some protection. Influenza vaccine does not cause flu. Influenza vaccine may be given at the same time as other vaccines. 3. Talk with your health care provider Tell your vaccine provider if the person getting the vaccine:  Has had an allergic reaction after a previous dose of influenza vaccine, or has any severe, life-threatening allergies.  Has ever had Guillain-Barr Syndrome (also called GBS). In some cases, your health care provider may decide to postpone influenza vaccination to a future visit. People with minor illnesses, such as a cold, may be vaccinated. People who are moderately or severely ill should usually wait until they recover before getting influenza vaccine. Your health care provider can give you more information. 4. Risks of a vaccine reaction  Soreness, redness, and swelling where shot is given, fever, muscle aches, and headache can happen after influenza vaccine.  There may be a very small increased risk of Guillain-Barr Syndrome (GBS) after inactivated influenza vaccine (the flu shot). Young children who get the flu shot along with pneumococcal vaccine (PCV13), and/or DTaP vaccine at the same time might be slightly more likely to have a seizure caused by fever. Tell your health care provider if a child who is getting flu vaccine has ever had a seizure. People sometimes faint after medical procedures, including vaccination. Tell your provider if you feel dizzy or have vision changes or ringing in the ears. As with any medicine,  there is a very remote chance of a vaccine causing a severe allergic reaction, other serious injury, or death. 5. What if there is a serious problem? An allergic reaction could occur after the vaccinated person leaves the clinic. If you see signs of a severe allergic  reaction (hives, swelling of the face and throat, difficulty breathing, a fast heartbeat, dizziness, or weakness), call 9-1-1 and get the person to the nearest hospital. For other signs that concern you, call your health care provider. Adverse reactions should be reported to the Vaccine Adverse Event Reporting System (VAERS). Your health care provider will usually file this report, or you can do it yourself. Visit the VAERS website at www.vaers.SamedayNews.es or call 857-076-5268.VAERS is only for reporting reactions, and VAERS staff do not give medical advice. 6. The National Vaccine Injury Compensation Program The [employer] Vaccine Injury Compensation Program (VICP) is a federal program that was created to compensate people who may have been injured by certain vaccines. Visit the VICP website at GoldCloset.com.ee or call 843-743-5203 to learn about the program and about filing a claim. There is a time limit to file a claim for compensation. 7. How can I learn more?  Ask your healthcare provider.  Call your local or state health department.  Contact the Centers for Disease Control and Prevention (CDC): ? Call 979-103-3085 (1-800-CDC-INFO) or ? Visit CDC's https://gibson.com/ Vaccine Information Statement (Interim) Inactivated Influenza Vaccine (03/04/2018) This information is not intended to replace advice given to you by your health care provider. Make sure you discuss any questions you have with your health care provider. Document Revised: 10/26/2018 Document Reviewed: 03/08/2018 Elsevier Patient Education  [place].

## 2020-04-02 NOTE — Progress Notes (Signed)
Subjective:    Patient ID: Sarah Payne, female    DOB: 18-Apr-1946, 74 y.o.   MRN: [id number]  HPI  This visit occurred during the SARS-CoV-2 public health emergency.  Safety protocols were in place, including screening questions prior to the visit, additional usage of staff PPE, and extensive cleaning of exam room while observing appropriate contact time as indicated for disinfecting solutions.   Sarah Payne is a 74 year old female with a significant medical history including type 2 diabetes, CAD, CHF, COPD, hypothyroidism, chronic pain syndrome, insomnia, tobacco abuse, peripheral neuropathy who presents today with a chief complaint of dizziness.  Her dizziness began about 10 days ago, occurs only when standing from a seated position, "everything goes black". She is not sleeping well, just about 3-4 hours nightly. She denies syncope, but feels pre-syncopal. She's stumbled a few times in her home due to symptoms, denies falling. "I feel drunk" when symptoms began.   She is checking her blood sugars regularly and is getting readings of low 200's. This morning her glucose reading was about 160. She is compliant to her levothyroxine 100 mcg daily. She is following with cardiology, has not yet contacted their office. She has been checking her blood pressure at home over the last 2-3 days which is running 180's/80's.    She is drinking no water during the day. She is urinating constantly during the day and evening, this began about 2-3 weeks ago. She denies hematuria, dysuria, unilateral numbness, unilateral weakness, changes in speech.  BP Readings from Last 3 Encounters:  04/02/20 (!) 144/82  02/27/20 124/84  02/01/20 126/78   Wt Readings from Last 3 Encounters:  04/02/20 148 lb (67.1 kg)  02/27/20 146 lb (66.2 kg)  02/01/20 148 lb 8 oz (67.4 kg)     Review of Systems  Constitutional: Negative for fever.  Genitourinary: Positive for frequency and urgency. Negative for dysuria and  hematuria.  Neurological: Positive for dizziness and light-headedness.       Past Medical History:  Diagnosis Date  . Acute systolic (congestive) heart failure ([facility]) 01/01/2016  . Cataract    BILATERAL REMOVED  . Chronic low back pain   . COPD (chronic obstructive pulmonary disease) ([place])   . Coronary artery disease    a. multiple PCIs to the mLCx. b. stent to dRCA 08/2011 in setting of NSTEMI. c. DES to prox-mid LCx for ISR 09/2011. d.  DESx2 to prox-mRCA 07/2012. e. Takotsubo event 12/2015 with patent stents.  . Depression   . Diabetes mellitus   . Diverticulosis   . Frequent PVCs    a. Noted in hospital 12/2015.  Marland Kitchen GERD (gastroesophageal reflux disease)   . Hyperlipidemia   . Hypertension   . Hypothyroidism   . Impingement syndrome of left shoulder   . Marijuana abuse   . Myocardial infarction ([place])    x 5  . Sleep apnea    mild-does not use cpap  . Takotsubo cardiomyopathy    a. 12/2015 - nephew committed suicide 1 week prior, sister died the morning of presentation - initially called a STEMI; cath with patent stents. LVEF 25-30%.  . Tendonitis of left rotator cuff   . Tobacco abuse   . Vascular dementia      Social History   Socioeconomic History  . Marital status: Married    Spouse name: Not on file  . Number of children: Not on file  . Years of education: Not on file  . Highest education level:  Not on file  Occupational History  . Not on file  Tobacco Use  . Smoking status: Current Every Day Smoker    Packs/day: 1.00    Years: 45.00    Pack years: 45.00    Types: Cigarettes  . Smokeless tobacco: Never Used  . Tobacco comment: Has cut back, trying to quit.   Vaping Use  . Vaping Use: Never used  Substance and Sexual Activity  . Alcohol use: No    Alcohol/week: 0.0 standard drinks  . Drug use: Yes    Types: Marijuana    Comment: last noc  . Sexual activity: Not on file  Other Topics Concern  . Not on file  Social History Narrative   Lives at home with  her husband in [place].  Previously used marijuana - quit.      Regular exercise: no/ pain from a frozen rotator cuff   Caffeine use: coffee daily and pepsi      Does not have a living will.   Daughters and husband know her wishes- would desire CPR but not prolonged life support if futile   Social Determinants of Health   Financial Resource Strain:   . Difficulty of Paying Living Expenses: Not on file  Food Insecurity:   . Worried About Charity fundraiser in the Last Year: Not on file  . Ran Out of Food in the Last Year: Not on file  Transportation Needs:   . Lack of Transportation (Medical): Not on file  . Lack of Transportation (Non-Medical): Not on file  Physical Activity:   . Days of Exercise per Week: Not on file  . Minutes of Exercise per Session: Not on file  Stress:   . Feeling of Stress : Not on file  Social Connections:   . Frequency of Communication with Friends and Family: Not on file  . Frequency of Social Gatherings with Friends and Family: Not on file  . Attends Religious Services: Not on file  . Active Member of Clubs or Organizations: Not on file  . Attends Archivist Meetings: Not on file  . Marital Status: Not on file  Intimate Partner Violence:   . Fear of Current or Ex-Partner: Not on file  . Emotionally Abused: Not on file  . Physically Abused: Not on file  . Sexually Abused: Not on file    Past Surgical History:  Procedure Laterality Date  . ABDOMINAL HYSTERECTOMY    . APPENDECTOMY    . BACK SURGERY    . CARDIAC CATHETERIZATION  2013  . CARDIAC CATHETERIZATION  2016  . CARDIAC CATHETERIZATION N/A 01/01/2016   Procedure: Left Heart Cath and Coronary Angiography;  Surgeon: Jettie Booze, MD;  Location: [facility];  Service: Cardiovascular;  Laterality: N/A;  . CATARACT EXTRACTION W/PHACO Left 01/30/2015   Procedure: CATARACT EXTRACTION PHACO AND INTRAOCULAR LENS PLACEMENT (IOC);  Surgeon: Birder Robson, MD;  Location:  [facility];  Service: Ophthalmology;  Laterality: Left;  [place] 00:47   . CATARACT EXTRACTION W/PHACO Right 02/13/2015   Procedure: CATARACT EXTRACTION PHACO AND INTRAOCULAR LENS PLACEMENT (IOC);  Surgeon: Birder Robson, MD;  Location: [facility];  Service: Ophthalmology;  Laterality: Right;  cassette lot # [id number] H [place]  00:29.9 AP  20.7 CDE  6.20  . COLONOSCOPY N/A 11/02/2014   Procedure: COLONOSCOPY;  Surgeon: Inda Castle, MD;  Location: [facility];  Service: Endoscopy;  Laterality: N/A;  . CORONARY ANGIOPLASTY  2012   stent x 3   . CORONARY  ANGIOPLASTY WITH STENT PLACEMENT  2013  . ESOPHAGOGASTRODUODENOSCOPY (EGD) WITH PROPOFOL N/A 09/21/2018   Procedure: ESOPHAGOGASTRODUODENOSCOPY (EGD) WITH PROPOFOL;  Surgeon: [name], MD;  Location: [facility];  Service: Gastroenterology;  Laterality: N/A;  . SHOULDER SURGERY Left 2017    Family History  Problem Relation Age of Onset  . Heart attack Mother        First MI @ 16 - Died @ 45  . Heart disease Mother   . Heart disease Father        Died @ 94  . Throat cancer Brother   . Liver cancer Brother   . Colon cancer Sister     Allergies  Allergen Reactions  . No Known Allergies     Current Outpatient Medications on File Prior to Visit  Medication Sig Dispense Refill  . albuterol (VENTOLIN HFA) 108 (90 Base) MCG/ACT inhaler Inhale 2 puffs into the lungs every 6 (six) hours as needed for wheezing or shortness of breath. 8 g 0  . amitriptyline (ELAVIL) 50 MG tablet Take 1 tablet (50 mg total) by mouth at bedtime. 30 tablet 5  . aspirin 81 MG tablet Take 81 mg by mouth daily.      . carvedilol (COREG) 6.25 MG tablet Take 1 tablet (6.25 mg total) by mouth 2 (two) times daily. 180 tablet 3  . clobetasol ointment (TEMOVATE) 0.05 % APPLY EVERY NIGHT TO VAGINAL FOLDS 30 g 0  . clopidogrel (PLAVIX) 75 MG tablet Take 1 tablet (75 mg total) by mouth daily. 90 tablet 3  . Continuous Blood Gluc Sensor (FREESTYLE LIBRE 14 DAY SENSOR) MISC 1 each  by Does not apply route every 14 (fourteen) days. Change every 2 weeks 6 each 3  . enalapril (VASOTEC) 20 MG tablet Take 1 tablet by mouth once daily for blood pressure. 90 tablet 3  . estradiol (ESTRACE VAGINAL) 0.1 MG/GM vaginal cream Place 1 Applicatorful vaginally 3 (three) times a week. 42.5 g 2  . EUTHYROX 100 MCG tablet Take 1 tablet by mouth every morning on an empty stomach with water only.  No food or other medications for 30 minutes. 30 tablet 1  . ezetimibe (ZETIA) 10 MG tablet Take 1 tablet (10 mg total) by mouth daily. For cholesterol. 90 tablet 3  . glucose blood (FREESTYLE LITE) test strip Use as instructed to test blood sugar 3 times a day 100 each 5  . insulin aspart (NOVOLOG FLEXPEN) 100 UNIT/ML FlexPen Inject 20 units before lunch, and inject 25 units before dinner. 15 mL 0  . insulin glargine (LANTUS SOLOSTAR) 100 UNIT/ML Solostar Pen Inject 25 Units into the skin 2 (two) times daily. 15 mL 5  . Insulin Pen Needle (INSUPEN PEN NEEDLES) 32G X 4 MM MISC Use to inject insulin 4 times daily. 250 each 1  . isosorbide mononitrate (IMDUR) 30 MG 24 hr tablet Take 1 tablet (30 mg total) by mouth daily. 90 tablet 3  . nystatin cream (MYCOSTATIN) Apply 1 application topically 2 (two) times daily. Apply a thin layer to clean and dry skin for up to 3 weeks. 30 g 0  . rosuvastatin (CRESTOR) 40 MG tablet Take 1 tablet (40 mg total) by mouth every evening. For cholesterol. 90 tablet 3  . traZODone (DESYREL) 100 MG tablet TAKE 1 TABLET BY MOUTH AT BEDTIME AS NEEDED FOR SLEEP 90 tablet 1  . gabapentin (NEURONTIN) 300 MG capsule Take 1 capsule (300 mg total) by mouth 3 (three) times daily. 90 capsule 5  No current facility-administered medications on file prior to visit.    BP (!) 144/82   Pulse 78   Temp (!) 96.4 F (35.8 C) (Temporal)   Ht 5\' 6"  (1.676 m)   Wt 148 lb (67.1 kg)   SpO2 98%   BMI 23.89 kg/m    Objective:   Physical Exam Eyes:     Extraocular Movements: Extraocular  movements intact.  Cardiovascular:     Rate and Rhythm: Normal rate and regular rhythm.  Pulmonary:     Effort: Pulmonary effort is normal.     Breath sounds: Normal breath sounds.  Musculoskeletal:     Cervical back: Neck supple.  Skin:    General: Skin is warm and dry.  Neurological:     General: No focal deficit present.     Mental Status: She is alert and oriented to person, place, and time.     Cranial Nerves: No cranial nerve deficit.  Psychiatric:        Mood and Affect: Mood normal.            Assessment & Plan:

## 2020-04-03 ENCOUNTER — Other Ambulatory Visit: Payer: Self-pay | Admitting: Primary Care

## 2020-04-03 ENCOUNTER — Telehealth: Payer: Self-pay | Admitting: Cardiovascular Disease

## 2020-04-03 ENCOUNTER — Other Ambulatory Visit: Payer: Self-pay

## 2020-04-03 DIAGNOSIS — E039 Hypothyroidism, unspecified: Secondary | ICD-10-CM

## 2020-04-03 MED ORDER — LEVOTHYROXINE SODIUM 75 MCG PO TABS: ORAL_TABLET | ORAL | 0 refills | Status: DC

## 2020-04-03 NOTE — Progress Notes (Signed)
sh

## 2020-04-03 NOTE — Telephone Encounter (Signed)
Pt c/o BP issue: STAT if pt c/o blurred vision, one-sided weakness or slurred speech  1. What are your last 5 BP readings?   182/86 see also pcp note    2. Are you having any other symptoms (ex. Dizziness, headache, blurred vision, passed out)?    Blacked vision numbness in scalp and light headed when standing   3. What is your BP issue? Elevated bp but drops when standing    Scheduled 9/ at 130 walker

## 2020-04-04 NOTE — Telephone Encounter (Signed)
Spoke with the patient. Patient sts that she is doing ok today. She does complain of numbness of her scalp. She was seen by her pcp on 04/02/20 for postural dizziness. Pt report having orthostatic BP taken at that visit. She denies pre-syncope or syncope. Her pcp thought that some of the patients symptoms could be related to dehydration. Patient sts that she has increased her intake of water. Encourage the patient to stay hydrated. She is scheduled in our office on 04/05/20 with Laurann Montana, NP. Adv the patient to keep that appt. She is to contact our office sooner if needed.

## 2020-04-05 ENCOUNTER — Encounter: Payer: Self-pay | Admitting: Family

## 2020-04-05 ENCOUNTER — Ambulatory Visit (INDEPENDENT_AMBULATORY_CARE_PROVIDER_SITE_OTHER): Payer: Medicare PPO | Admitting: Family

## 2020-04-05 ENCOUNTER — Other Ambulatory Visit: Payer: Self-pay

## 2020-04-05 VITALS — BP 115/81 | HR 78 | Ht 66.0 in | Wt 147.5 lb

## 2020-04-05 DIAGNOSIS — Z720 Tobacco use: Secondary | ICD-10-CM

## 2020-04-05 DIAGNOSIS — J449 Chronic obstructive pulmonary disease, unspecified: Secondary | ICD-10-CM | POA: Diagnosis not present

## 2020-04-05 DIAGNOSIS — R0600 Dyspnea, unspecified: Secondary | ICD-10-CM

## 2020-04-05 DIAGNOSIS — I6523 Occlusion and stenosis of bilateral carotid arteries: Secondary | ICD-10-CM | POA: Diagnosis not present

## 2020-04-05 DIAGNOSIS — R0609 Other forms of dyspnea: Secondary | ICD-10-CM

## 2020-04-05 DIAGNOSIS — E785 Hyperlipidemia, unspecified: Secondary | ICD-10-CM | POA: Diagnosis not present

## 2020-04-05 DIAGNOSIS — R55 Syncope and collapse: Secondary | ICD-10-CM

## 2020-04-05 DIAGNOSIS — I2510 Atherosclerotic heart disease of native coronary artery without angina pectoris: Secondary | ICD-10-CM | POA: Diagnosis not present

## 2020-04-05 MED ORDER — CLOPIDOGREL BISULFATE 75 MG PO TABS: 75.0000 mg | ORAL_TABLET | Freq: Every day | ORAL | 3 refills | Status: DC

## 2020-04-05 NOTE — Patient Instructions (Signed)
Medication Instructions:  No medication changes today.   *If you need a refill on your cardiac medications before your next appointment, please call your pharmacy*   Lab Work: No lab work today.   If you have labs (blood work) drawn today and your tests are completely normal, you will receive your results only by: Marland Kitchen MyChart Message (if you have MyChart) OR . A paper copy in the mail If you have any lab test that is abnormal or we need to change your treatment, we will call you to review the results.   Testing/Procedures: Your physician has requested that you have a carotid duplex. This test is an ultrasound of the carotid arteries in your neck. It looks at blood flow through these arteries that supply the brain with blood. Allow one hour for this exam. There are no restrictions or special instructions.  Your physician has requested that you have an echocardiogram. Echocardiography is a painless test that uses sound waves to create images of your heart. It provides your doctor with information about the size and shape of your heart and how well your heart's chambers and valves are working. This procedure takes approximately one hour. There are no restrictions for this procedure.  Follow-Up: At [facility], you and your health needs are our priority.  As part of our continuing mission to provide you with exceptional heart care, we have created designated Provider Care Teams.  These Care Teams include your primary Cardiologist (physician) and Advanced Practice Providers (APPs -  Physician Assistants and Nurse Practitioners) who all work together to provide you with the care you need, when you need it.  We recommend signing up for the patient portal called "MyChart".  Sign up information is provided on this After Visit Summary.  MyChart is used to connect with patients for Virtual Visits (Telemedicine).  Patients are able to view lab/test results, encounter notes, upcoming appointments, etc.   Non-urgent messages can be sent to your provider as well.   To learn more about what you can do with MyChart, go to [email or web address].    Your next appointment:   After your echocardiogram and carotid duplex  The format for your next appointment:   In Person  Provider:    You may see Ida Rogue, MD or one of the following Advanced Practice Providers on your designated Care Team:  Murray Hodgkins, NP  Christell Faith, PA-C  Laurann Montana, NP  [name], PA-C  Other Instructions   Orthostatic Hypotension Blood pressure is a measurement of how strongly, or weakly, your blood is pressing against the walls of your arteries. Orthostatic hypotension is a sudden drop in blood pressure that happens when you quickly change positions, such as when you get up from sitting or lying down. Arteries are blood vessels that carry blood from your heart throughout your body. When blood pressure is too low, you may not get enough blood to your brain or to the rest of your organs. This can cause weakness, light-headedness, rapid heartbeat, and fainting. This can last for just a few seconds or for up to a few minutes. Orthostatic hypotension is usually not a serious problem. However, if it happens frequently or gets worse, it may be a sign of something more serious. What are the causes? This condition may be caused by:  Sudden changes in posture, such as standing up quickly after you have been sitting or lying down.  Blood loss.  Loss of body fluids (dehydration).  Heart problems.  Hormone (  endocrine) problems.  Pregnancy.  Severe infection.  Lack of certain nutrients.  Severe allergic reactions (anaphylaxis).  Certain medicines, such as blood pressure medicine or medicines that make the body lose excess fluids (diuretics). Sometimes, this condition can be caused by not taking medicine as directed, such as taking too much of a certain medicine. What increases the risk? The  following factors may make you more likely to develop this condition:  Age. Risk increases as you get older.  Conditions that affect the heart or the central nervous system.  Taking certain medicines, such as blood pressure medicine or diuretics.  Being pregnant. What are the signs or symptoms? Symptoms of this condition may include:  Weakness.  Light-headedness.  Dizziness.  Blurred vision.  Fatigue.  Rapid heartbeat.  Fainting, in severe cases. How is this diagnosed? This condition is diagnosed based on:  Your medical history.  Your symptoms.  Your blood pressure measurement. Your health care provider will check your blood pressure when you are: ? Lying down. ? Sitting. ? Standing. A blood pressure reading is recorded as two numbers, such as "120 over 80" (or 120/80). The first ("top") number is called the systolic pressure. It is a measure of the pressure in your arteries as your heart beats. The second ("bottom") number is called the diastolic pressure. It is a measure of the pressure in your arteries when your heart relaxes between beats. Blood pressure is measured in a unit called mm Hg. Healthy blood pressure for most adults is 120/80. If your blood pressure is below 90/60, you may be diagnosed with hypotension. Other information or tests that may be used to diagnose orthostatic hypotension include:  Your other vital signs, such as your heart rate and temperature.  Blood tests.  Tilt table test. For this test, you will be safely secured to a table that moves you from a lying position to an upright position. Your heart rhythm and blood pressure will be monitored during the test. How is this treated? This condition may be treated by:  Changing your diet. This may involve eating more salt (sodium) or drinking more water.  Taking medicines to raise your blood pressure.  Changing the dosage of certain medicines you are taking that might be lowering your blood  pressure.  Wearing compression stockings. These stockings help to prevent blood clots and reduce swelling in your legs. In some cases, you may need to go to the hospital for:  Fluid replacement. This means you will receive fluids through an IV.  Blood replacement. This means you will receive donated blood through an IV (transfusion).  Treating an infection or heart problems, if this applies.  Monitoring. You may need to be monitored while medicines that you are taking wear off. Follow these instructions at home: Eating and drinking   Drink enough fluid to keep your urine pale yellow.  Eat a healthy diet, and follow instructions from your health care provider about eating or drinking restrictions. A healthy diet includes: ? Fresh fruits and vegetables. ? Whole grains. ? Lean meats. ? Low-fat dairy products.  Eat extra salt only as directed. Do not add extra salt to your diet unless your health care provider told you to do that.  Eat frequent, small meals.  Avoid standing up suddenly after eating. Medicines  Take over-the-counter and prescription medicines only as told by your health care provider. ? Follow instructions from your health care provider about changing the dosage of your current medicines, if this applies. ? Do  not stop or adjust any of your medicines on your own. General instructions   Wear compression stockings as told by your health care provider.  Get up slowly from lying down or sitting positions. This gives your blood pressure a chance to adjust.  Avoid hot showers and excessive heat as directed by your health care provider.  Return to your normal activities as told by your health care provider. Ask your health care provider what activities are safe for you.  Do not use any products that contain nicotine or tobacco, such as cigarettes, e-cigarettes, and chewing tobacco. If you need help quitting, ask your health care provider.  Keep all follow-up visits  as told by your health care provider. This is important. Contact a health care provider if you:  Vomit.  Have diarrhea.  Have a fever for more than 2-3 days.  Feel more thirsty than usual.  Feel weak and tired. Get help right away if you:  Have chest pain.  Have a fast or irregular heartbeat.  Develop numbness in any part of your body.  Cannot move your arms or your legs.  Have trouble speaking.  Become sweaty or feel light-headed.  Faint.  Feel short of breath.  Have trouble staying awake.  Feel confused. Summary  Orthostatic hypotension is a sudden drop in blood pressure that happens when you quickly change positions.  Orthostatic hypotension is usually not a serious problem.  It is diagnosed by having your blood pressure taken lying down, sitting, and then standing.  It may be treated by changing your diet or adjusting your medicines. This information is not intended to replace advice given to you by your health care provider. Make sure you discuss any questions you have with your health care provider. Document Revised: 12/31/2017 Document Reviewed: 12/31/2017 Elsevier Patient Education  [place].

## 2020-04-05 NOTE — Progress Notes (Signed)
Office Visit    Patient Name: Sarah Payne Date of Encounter: 04/05/2020  Primary Care Provider:  [name], NP Primary Cardiologist:  Ida Rogue, MD Electrophysiologist:  None   Chief Complaint    Sarah Payne is a 74 y.o. female with a hx of CAD, COPD, HLD, stress cardiomyopathy presents today for orthostatic hypotension.   Past Medical History    Past Medical History:  Diagnosis Date  . Acute systolic (congestive) heart failure ([place]) 01/01/2016  . Cataract    BILATERAL REMOVED  . Chronic low back pain   . COPD (chronic obstructive pulmonary disease) ([place])   . Coronary artery disease    a. multiple PCIs to the mLCx. b. stent to dRCA 08/2011 in setting of NSTEMI. c. DES to prox-mid LCx for ISR 09/2011. d.  DESx2 to prox-mRCA 07/2012. e. Takotsubo event 12/2015 with patent stents.  . Depression   . Diabetes mellitus   . Diverticulosis   . Frequent PVCs    a. Noted in hospital 12/2015.  Marland Kitchen GERD (gastroesophageal reflux disease)   . Hyperlipidemia   . Hypertension   . Hypothyroidism   . Impingement syndrome of left shoulder   . Marijuana abuse   . Myocardial infarction ([place])    x 5  . Sleep apnea    mild-does not use cpap  . Takotsubo cardiomyopathy    a. 12/2015 - nephew committed suicide 1 week prior, sister died the morning of presentation - initially called a STEMI; cath with patent stents. LVEF 25-30%.  . Tendonitis of left rotator cuff   . Tobacco abuse   . Vascular dementia    Past Surgical History:  Procedure Laterality Date  . ABDOMINAL HYSTERECTOMY    . APPENDECTOMY    . BACK SURGERY    . CARDIAC CATHETERIZATION  2013  . CARDIAC CATHETERIZATION  2016  . CARDIAC CATHETERIZATION N/A 01/01/2016   Procedure: Left Heart Cath and Coronary Angiography;  Surgeon: Jettie Booze, MD;  Location: [facility];  Service: Cardiovascular;  Laterality: N/A;  . CATARACT EXTRACTION W/PHACO Left 01/30/2015   Procedure: CATARACT EXTRACTION PHACO AND  INTRAOCULAR LENS PLACEMENT (IOC);  Surgeon: Birder Robson, MD;  Location: [facility];  Service: Ophthalmology;  Laterality: Left;  [place] 00:47   . CATARACT EXTRACTION W/PHACO Right 02/13/2015   Procedure: CATARACT EXTRACTION PHACO AND INTRAOCULAR LENS PLACEMENT (IOC);  Surgeon: Birder Robson, MD;  Location: [facility];  Service: Ophthalmology;  Laterality: Right;  cassette lot # [id number] H [place]  00:29.9 AP  20.7 CDE  6.20  . COLONOSCOPY N/A 11/02/2014   Procedure: COLONOSCOPY;  Surgeon: Inda Castle, MD;  Location: [facility];  Service: Endoscopy;  Laterality: N/A;  . CORONARY ANGIOPLASTY  2012   stent x 3   . CORONARY ANGIOPLASTY WITH STENT PLACEMENT  2013  . ESOPHAGOGASTRODUODENOSCOPY (EGD) WITH PROPOFOL N/A 09/21/2018   Procedure: ESOPHAGOGASTRODUODENOSCOPY (EGD) WITH PROPOFOL;  Surgeon: [name], MD;  Location: [facility];  Service: Gastroenterology;  Laterality: N/A;  . SHOULDER SURGERY Left 2017    Allergies  Allergies  Allergen Reactions  . No Known Allergies     History of Present Illness    Sarah Payne is a 74 y.o. female with a hx of CAD, COPD, HLD, DM2, tobacco abuse, stress cardiomyopathy, depression last seen 02/27/20 by Dr. Rockey Situ  She is in cardiac catheterization 12/2015 with patent stents in the circumflex and RCA.  Noted severely depressed LVEF with the pattern of Takotsubo cardiomyopathy.  Echo  12/2015 EF 25-30% with wall motion abnormalities consider Takotsubo cardiomyopathy, grade 1 diastolic dysfunction.  Repeat echocardiogram 03/2016 EF 60 to 65%, noRWMA, grade 1 diastolic dysfunction, and trivial pericardial effusion, no significant valvular abnormalities.  She underwent carotid duplex 11/2016 with bilateral less than 39% stenosis.  She was seen in clinic 08/29/2019.  Noted lower extremity discomfort and was scheduled for ABIs.  Subsequent ABIs 10/04/2019 within normal range with no evidence of PAD.  Seen by Dr. Rockey Situ 02/27/2020 for 84-month follow-up.  She reported no  anginal symptoms.  Her blood pressure was well controlled.  Smoking cessation was encouraged.  No changes made.  Seen by primary care 04/02/2020 with 10-day history of dizziness with position changes.  Endorsed presyncopal symptoms.  Blood sugars at home routinely low 200s.  Blood pressure at home routinely 180s over 100s.  Poor p.o. intake noted.  Her EKG independently reviewed to normal sinus rhythm 60 bpm.  She had positive orthostatic vital changes 1 moving from laying to sitting position.  UA unremarkable.  Lab work 04/02/2020 creatinine 1.11, GFR 48.31, TSH 0.06.  Her levothyroxine dose was decreased to 75 mcg by her primary care provider.  She enjoys dot painting at home to stay busy and playing games on her tablet.  Reports since she was seen by her primary care earlier this week she has increased her fluid intake.  She has been drinking Sprite 0 and water predominantly.  She also has been making position changes slowly.  Reports no recurrent lightheaded, dizziness, near syncope.  Notes numbness to the top of her head that is present at all times. No noted exacerbating or relieving factors.   Tells me her BP at home on Monday was very high. She checked it yesterday again, but tells me it was a little on the high side approximately 160/86.   Tells me she feels like she is "panting". Tells me she has notices more dyspnea on exertion over the last few weeks. Tells me she has been coughing and notices improvement with Albuterol inhaler. She is using it about twice per day.   Reports no shortness of breath at rest. Reports no chest pain, pressure, or tightness. No edema, orthopnea, PND. Reports no palpitations.   Orthostatic VS for the past 24 hrs:  BP- Lying Pulse- Lying BP- Sitting Pulse- Sitting BP- Standing at 0 minutes Pulse- Standing at 0 minutes  04/05/20 1329 129/83 75 114/79 76 121/77 81      EKGs/Labs/Other Studies Reviewed:   The following studies were reviewed today:  EKG:  EKG  ordered today. EKG performed today demonstrates NSR 78 bpm with stable TWI lead I and aVL with T-wave abnormality in lateral leads similar to previous EKGs.   Recent Labs: 08/29/2019: ALT 14 04/02/2020: BUN 19; Creatinine, Ser 1.11; Hemoglobin 13.7; Platelets 246.0; Potassium 4.0; Sodium 137; TSH 0.06  Recent Lipid Panel    Component Value Date/Time   CHOL 95 02/01/2020 1234   CHOL 200 (H) 08/29/2019 0947   CHOL 292 (H) 08/02/2014 0408   TRIG 130.0 02/01/2020 1234   TRIG 388 (H) 08/02/2014 0408   HDL 37.90 (L) 02/01/2020 1234   HDL 34 (L) 08/29/2019 0947   HDL 34 (L) 08/02/2014 0408   CHOLHDL 2 02/01/2020 1234   VLDL 26.0 02/01/2020 1234   VLDL 78 (H) 08/02/2014 0408   LDLCALC 31 02/01/2020 1234   LDLCALC 131 (H) 08/29/2019 0947   LDLCALC 180 (H) 08/02/2014 0408   LDLDIRECT 143.0 12/24/2018 1227  Home Medications   No outpatient medications have been marked as taking for the 04/05/20 encounter (Appointment) with Loel Dubonnet, NP.    Review of Systems    Review of Systems  Constitutional: Negative for chills, fever and malaise/fatigue.  Cardiovascular: Positive for dyspnea on exertion. Negative for chest pain, leg swelling, near-syncope, orthopnea, palpitations and syncope.  Respiratory: Positive for cough. Negative for shortness of breath and wheezing.   Gastrointestinal: Negative for nausea and vomiting.  Neurological: Negative for dizziness, light-headedness and weakness.   All other systems reviewed and are otherwise negative except as noted above.  Physical Exam    VS:  There were no vitals taken for this visit. , BMI There is no height or weight on file to calculate BMI. GEN: Well nourished, well developed, in no acute distress. HEENT: normal. Neck: Supple, no JVD, carotid bruits, or masses. Cardiac: RRR, no murmurs, rubs, or gallops. No clubbing, cyanosis, edema.  Radials 2+ and equal bilaterally. PT 1+ and equal bilaterally. Respiratory:  Respirations regular  and unlabored, clear to auscultation bilaterally. GI: Soft, nontender, nondistended, BS + x 4. MS: No deformity or atrophy. Skin: Warm and dry, no rash. Neuro:  Strength and sensation are intact. Psych: Normal affect.  Assessment & Plan    1. HTN/orthostatic hypotension - Episode Monday orthostatic hypotension. Reports lightheadedness, near syncope, "black vision". Evaluated by PCP noted to be drinking no water. Symptoms have improved since drinking fluids and making position changes slowly. Orthostatic vital signs today with BP drop 129/83 to 114/79 from laying to sitting but improved to 121/77 and 1225/83 when standing. HR remained stable. BP well controlled. Continue present antihypertensive regimen. Orthostatic precautions encouraged.   2. Carotid artery stenosis - Episode of near syncope, lightheadedness, vision changes. Carotid duplex ordered to exclude worsening stenosis as last study 2018 with bilateral 1-39% stenosis.   3. CAD - Reports no chest pain, pressure, tightness. Endorses worsening DOE over the last few weeks. Likely etiology COPD vs HF. EKG today NSR 78 bpm with known TWI lead I and aVL with stable T wave abnormalities in lateral leads when compared to previous. Continue GDMT aspirin, plavix, beta blocker, long acting nitrate, statin, zetia. Plavix refill provided. No indication for ischemic evaluation at this time, echo ordered due to DOE and will assess for wall motion abnormalities.   4. HLD, LDL goal <70 - Lipid panel 02/01/20 with total cholesterol 95, triglycerides 130, LDL 31, HDL 37.9. Crestor 40mg  daily and Zetia 10mg  daily.  5. Chronic systolic heart failure - Euvolemic and well compensated on exam. Echo 03/2016 LVEF 60-65%. Continue beta blocker/ARB. Continue low sodium diet. Notes worsening DOE, plan for echocardiogram.   6. Tobacco abuse - Smoking cessation encouraged. Not interested in quitting at this time.  Recommend utilization of [id number].   7. COPD -  Reports cough stable at baseline. Endorses worsening DOE over last few weeks. Smoking cessation encouraged. Follows with her PCP. Continue PRN Albuterol. May benefit from maintenance inhaler.   Disposition: Plan for carotid duplex and echocardiogram. Follow up after echo/carotid duplex with Dr. Rockey Situ or APP.   Loel Dubonnet, NP 04/05/2020, 12:56 PM

## 2020-04-18 ENCOUNTER — Other Ambulatory Visit: Payer: Medicare PPO

## 2020-04-19 ENCOUNTER — Other Ambulatory Visit: Payer: Self-pay | Admitting: Family

## 2020-04-19 DIAGNOSIS — I6523 Occlusion and stenosis of bilateral carotid arteries: Secondary | ICD-10-CM

## 2020-04-19 DIAGNOSIS — R0600 Dyspnea, unspecified: Secondary | ICD-10-CM

## 2020-05-02 ENCOUNTER — Ambulatory Visit (INDEPENDENT_AMBULATORY_CARE_PROVIDER_SITE_OTHER): Payer: Medicare PPO

## 2020-05-02 ENCOUNTER — Other Ambulatory Visit: Payer: Self-pay

## 2020-05-02 DIAGNOSIS — I6523 Occlusion and stenosis of bilateral carotid arteries: Secondary | ICD-10-CM | POA: Diagnosis not present

## 2020-05-02 DIAGNOSIS — R0600 Dyspnea, unspecified: Secondary | ICD-10-CM

## 2020-05-02 LAB — ECHOCARDIOGRAM COMPLETE
AR max vel: 2.82 cm2
AV Area VTI: 2.69 cm2
AV Area mean vel: 2.77 cm2
AV Mean grad: 2 mmHg
AV Peak grad: 4.1 mmHg
Ao pk vel: 1.01 m/s
Area-P 1/2: 2.37 cm2
Calc EF: 50.2 %
S' Lateral: 3 cm
Single Plane A2C EF: 49.7 %
Single Plane A4C EF: 51.3 %

## 2020-05-03 ENCOUNTER — Encounter: Payer: Self-pay | Admitting: Primary Care

## 2020-05-03 ENCOUNTER — Other Ambulatory Visit: Payer: Self-pay | Admitting: Student in an Organized Health Care Education/Training Program

## 2020-05-03 ENCOUNTER — Ambulatory Visit (INDEPENDENT_AMBULATORY_CARE_PROVIDER_SITE_OTHER): Payer: Medicare PPO | Admitting: Primary Care

## 2020-05-03 VITALS — BP 118/82 | HR 72 | Temp 97.9°F | Ht 66.0 in | Wt 151.0 lb

## 2020-05-03 DIAGNOSIS — G894 Chronic pain syndrome: Secondary | ICD-10-CM

## 2020-05-03 DIAGNOSIS — G629 Polyneuropathy, unspecified: Secondary | ICD-10-CM

## 2020-05-03 DIAGNOSIS — E1151 Type 2 diabetes mellitus with diabetic peripheral angiopathy without gangrene: Secondary | ICD-10-CM

## 2020-05-03 LAB — POCT GLYCOSYLATED HEMOGLOBIN (HGB A1C): Hemoglobin A1C: 10.3 % — AB (ref 4.0–5.6)

## 2020-05-03 MED ORDER — LANTUS SOLOSTAR 100 UNIT/ML ~~LOC~~ SOPN: 30.0000 [IU] | PEN_INJECTOR | Freq: Two times a day (BID) | SUBCUTANEOUS | 3 refills | Status: DC

## 2020-05-03 NOTE — Progress Notes (Signed)
Subjective:    Patient ID: Sarah Payne, female    DOB: [birth year], 74 y.o.   MRN: [id number]  HPI  This visit occurred during the SARS-CoV-2 public health emergency.  Safety protocols were in place, including screening questions prior to the visit, additional usage of staff PPE, and extensive cleaning of exam room while observing appropriate contact time as indicated for disinfecting solutions.   Sarah Payne is a 74 year old female with a history of uncontrolled type 2 diabetes, CAD, postural dizziness, hypertension, Takotsubo cardiomyopathy, chronic bronchitis, COPD, hypothyroidism, chronic pain syndrome who presents today for follow up of diabetes.  Current medications include: Lantus 25 units BID, Novolog 20-25 units TID with meals. She is actually injecting Lantus 50 units at bedtime, which she began about 3 weeks ago.   She is checking her blood glucose 6-7 times daily and is getting readings ranging 200's-300's. Two hours after a meal her blood glucose is in the low 100's.   Highest reading of 404 Lowest reading of 38 on week ago, woke up at 2 am.   Last A1C: 9.5 in July 2021, 10.3 today Last Eye Exam: Due Last Foot Exam: UTD Pneumonia Vaccination: UTD ACE/ARB: ACE-I Statin: Crestor  BP Readings from Last 3 Encounters:  05/03/20 118/82  04/05/20 115/81  04/02/20 (!) 144/82      Review of Systems  Eyes: Negative for visual disturbance.  Respiratory: Negative for shortness of breath.   Cardiovascular: Negative for chest pain.  Neurological: Positive for dizziness and numbness.       Past Medical History:  Diagnosis Date  . Acute systolic (congestive) heart failure ([place]) 01/01/2016  . Cataract    BILATERAL REMOVED  . Chronic low back pain   . COPD (chronic obstructive pulmonary disease) ([place])   . Coronary artery disease    a. multiple PCIs to the mLCx. b. stent to dRCA 08/2011 in setting of NSTEMI. c. DES to prox-mid LCx for ISR 09/2011. d.  DESx2 to prox-mRCA  07/2012. e. Takotsubo event 12/2015 with patent stents.  . Depression   . Diabetes mellitus   . Diverticulosis   . Frequent PVCs    a. Noted in hospital 12/2015.  Marland Kitchen GERD (gastroesophageal reflux disease)   . Hyperlipidemia   . Hypertension   . Hypothyroidism   . Impingement syndrome of left shoulder   . Marijuana abuse   . Myocardial infarction ([place])    x 5  . Sleep apnea    mild-does not use cpap  . Takotsubo cardiomyopathy    a. 12/2015 - nephew committed suicide 1 week prior, sister died the morning of presentation - initially called a STEMI; cath with patent stents. LVEF 25-30%.  . Tendonitis of left rotator cuff   . Tobacco abuse   . Vascular dementia      Social History   Socioeconomic History  . Marital status: Married    Spouse name: Not on file  . Number of children: Not on file  . Years of education: Not on file  . Highest education level: Not on file  Occupational History  . Not on file  Tobacco Use  . Smoking status: Current Every Day Smoker    Packs/day: 1.00    Years: 45.00    Pack years: 45.00    Types: Cigarettes  . Smokeless tobacco: Never Used  . Tobacco comment: Has cut back, trying to quit.   Vaping Use  . Vaping Use: Never used  Substance and Sexual Activity  .  Alcohol use: No    Alcohol/week: 0.0 standard drinks  . Drug use: Yes    Types: Marijuana    Comment: last noc  . Sexual activity: Not on file  Other Topics Concern  . Not on file  Social History Narrative   Lives at home with her husband in [place].  Previously used marijuana - quit.      Regular exercise: no/ pain from a frozen rotator cuff   Caffeine use: coffee daily and pepsi      Does not have a living will.   Daughters and husband know her wishes- would desire CPR but not prolonged life support if futile   Social Determinants of Health   Financial Resource Strain:   . Difficulty of Paying Living Expenses: Not on file  Food Insecurity:   . Worried About Sales executive in the Last Year: Not on file  . Ran Out of Food in the Last Year: Not on file  Transportation Needs:   . Lack of Transportation (Medical): Not on file  . Lack of Transportation (Non-Medical): Not on file  Physical Activity:   . Days of Exercise per Week: Not on file  . Minutes of Exercise per Session: Not on file  Stress:   . Feeling of Stress : Not on file  Social Connections:   . Frequency of Communication with Friends and Family: Not on file  . Frequency of Social Gatherings with Friends and Family: Not on file  . Attends Religious Services: Not on file  . Active Member of Clubs or Organizations: Not on file  . Attends Archivist Meetings: Not on file  . Marital Status: Not on file  Intimate Partner Violence:   . Fear of Current or Ex-Partner: Not on file  . Emotionally Abused: Not on file  . Physically Abused: Not on file  . Sexually Abused: Not on file    Past Surgical History:  Procedure Laterality Date  . ABDOMINAL HYSTERECTOMY    . APPENDECTOMY    . BACK SURGERY    . CARDIAC CATHETERIZATION  2013  . CARDIAC CATHETERIZATION  2016  . CARDIAC CATHETERIZATION N/A 01/01/2016   Procedure: Left Heart Cath and Coronary Angiography;  Surgeon: Jettie Booze, MD;  Location: [facility];  Service: Cardiovascular;  Laterality: N/A;  . CATARACT EXTRACTION W/PHACO Left 01/30/2015   Procedure: CATARACT EXTRACTION PHACO AND INTRAOCULAR LENS PLACEMENT (IOC);  Surgeon: Birder Robson, MD;  Location: [facility];  Service: Ophthalmology;  Laterality: Left;  [place] 00:47   . CATARACT EXTRACTION W/PHACO Right 02/13/2015   Procedure: CATARACT EXTRACTION PHACO AND INTRAOCULAR LENS PLACEMENT (IOC);  Surgeon: Birder Robson, MD;  Location: [facility];  Service: Ophthalmology;  Laterality: Right;  cassette lot # [id number] H [place]  00:29.9 AP  20.7 CDE  6.20  . COLONOSCOPY N/A 11/02/2014   Procedure: COLONOSCOPY;  Surgeon: Inda Castle, MD;  Location: [facility];  Service:  Endoscopy;  Laterality: N/A;  . CORONARY ANGIOPLASTY  2012   stent x 3   . CORONARY ANGIOPLASTY WITH STENT PLACEMENT  2013  . ESOPHAGOGASTRODUODENOSCOPY (EGD) WITH PROPOFOL N/A 09/21/2018   Procedure: ESOPHAGOGASTRODUODENOSCOPY (EGD) WITH PROPOFOL;  Surgeon: [name], MD;  Location[facility];  Service: Gastroenterology;  Laterality: N/A;  . SHOULDER SURGERY Left 2017    Family History  Problem Relation Age of Onset  . Heart attack Mother        First MI @ 58 - Died @ 64  . Heart  disease Mother   . Heart disease Father        Died @ 37  . Throat cancer Brother   . Liver cancer Brother   . Colon cancer Sister     Allergies  Allergen Reactions  . No Known Allergies     Current Outpatient Medications on File Prior to Visit  Medication Sig Dispense Refill  . albuterol (VENTOLIN HFA) 108 (90 Base) MCG/ACT inhaler Inhale 2 puffs into the lungs every 6 (six) hours as needed for wheezing or shortness of breath. 8 g 0  . aspirin 81 MG tablet Take 81 mg by mouth daily.      . carvedilol (COREG) 6.25 MG tablet Take 1 tablet (6.25 mg total) by mouth 2 (two) times daily. 180 tablet 3  . clobetasol ointment (TEMOVATE) 0.05 % APPLY EVERY NIGHT TO VAGINAL FOLDS 30 g 0  . clopidogrel (PLAVIX) 75 MG tablet Take 1 tablet (75 mg total) by mouth daily. 90 tablet 3  . enalapril (VASOTEC) 20 MG tablet Take 1 tablet by mouth once daily for blood pressure. 90 tablet 3  . estradiol (ESTRACE VAGINAL) 0.1 MG/GM vaginal cream Place 1 Applicatorful vaginally 3 (three) times a week. 42.5 g 2  . ezetimibe (ZETIA) 10 MG tablet Take 1 tablet (10 mg total) by mouth daily. For cholesterol. 90 tablet 3  . gabapentin (NEURONTIN) 300 MG capsule Take 1 capsule (300 mg total) by mouth 3 (three) times daily. 90 capsule 5  . insulin aspart (NOVOLOG FLEXPEN) 100 UNIT/ML FlexPen Inject 20 units before lunch, and inject 25 units before dinner. 15 mL 0  . Insulin Pen Needle (INSUPEN PEN NEEDLES) 32G X 4 MM MISC Use to  inject insulin 4 times daily. 250 each 1  . isosorbide mononitrate (IMDUR) 30 MG 24 hr tablet Take 1 tablet (30 mg total) by mouth daily. 90 tablet 3  . levothyroxine (SYNTHROID) 75 MCG tablet Take 1 tablet by mouth every morning on an empty stomach with water only.  No food or other medications for 30 minutes. 90 tablet 0  . rosuvastatin (CRESTOR) 40 MG tablet Take 1 tablet (40 mg total) by mouth every evening. For cholesterol. 90 tablet 3  . traZODone (DESYREL) 100 MG tablet TAKE 1 TABLET BY MOUTH AT BEDTIME AS NEEDED FOR SLEEP 90 tablet 1   No current facility-administered medications on file prior to visit.    BP 118/82   Pulse 72   Temp 97.9 F (36.6 C) (Temporal)   Ht 5\' 6"  (1.676 m)   Wt 151 lb (68.5 kg)   SpO2 (!) 72%   BMI 24.37 kg/m    Objective:   Physical Exam Cardiovascular:     Rate and Rhythm: Normal rate and regular rhythm.  Pulmonary:     Effort: Pulmonary effort is normal.     Breath sounds: Normal breath sounds.  Musculoskeletal:     Cervical back: Neck supple.  Skin:    General: Skin is warm and dry.            Assessment & Plan:

## 2020-05-03 NOTE — Assessment & Plan Note (Signed)
Uncontrolled and worse with A1C today of 10.3.  She decided to move her Lantus to HS despite instructions to divide dose. Glucose readings 2 hours after a meal seem well controlled....  Given her increase in A1C coupled with her medical history, we will increase her Lantus to 60 units and divide the dose to BID. She verbalized understanding. Continue Novolog as is.  Follow up in 3 months. She will notify sooner if glucose readings remain at or above 200.

## 2020-05-03 NOTE — Patient Instructions (Addendum)
We've increased your Lantus to 30 units twice daily.   Continue Novolog at 20-25 units before meals.  Continue to check your blood sugars as discussed. Notify me if you continue to see readings at or above 200 consistently, or if you drop below 80 consistently.  Please schedule a follow up appointment in 3 months for diabetes check.  It was a pleasure to see you today!

## 2020-05-04 ENCOUNTER — Other Ambulatory Visit: Payer: Self-pay

## 2020-05-04 ENCOUNTER — Ambulatory Visit (INDEPENDENT_AMBULATORY_CARE_PROVIDER_SITE_OTHER): Payer: Medicare PPO | Admitting: Family

## 2020-05-04 ENCOUNTER — Encounter: Payer: Self-pay | Admitting: Family

## 2020-05-04 VITALS — BP 140/84 | HR 96 | Ht 66.0 in | Wt 150.0 lb

## 2020-05-04 DIAGNOSIS — I5189 Other ill-defined heart diseases: Secondary | ICD-10-CM

## 2020-05-04 DIAGNOSIS — I5022 Chronic systolic (congestive) heart failure: Secondary | ICD-10-CM

## 2020-05-04 DIAGNOSIS — I25118 Atherosclerotic heart disease of native coronary artery with other forms of angina pectoris: Secondary | ICD-10-CM

## 2020-05-04 DIAGNOSIS — J449 Chronic obstructive pulmonary disease, unspecified: Secondary | ICD-10-CM

## 2020-05-04 DIAGNOSIS — I10 Essential (primary) hypertension: Secondary | ICD-10-CM

## 2020-05-04 DIAGNOSIS — R0600 Dyspnea, unspecified: Secondary | ICD-10-CM

## 2020-05-04 DIAGNOSIS — R0609 Other forms of dyspnea: Secondary | ICD-10-CM

## 2020-05-04 DIAGNOSIS — I6523 Occlusion and stenosis of bilateral carotid arteries: Secondary | ICD-10-CM

## 2020-05-04 DIAGNOSIS — Z720 Tobacco use: Secondary | ICD-10-CM

## 2020-05-04 MED ORDER — FUROSEMIDE 20 MG PO TABS: 20.0000 mg | ORAL_TABLET | Freq: Every day | ORAL | 2 refills | Status: DC

## 2020-05-04 NOTE — Progress Notes (Signed)
Office Visit    Patient Name: Sarah Payne Date of Encounter: 05/04/2020  Primary Care Provider:  [name], NP Primary Cardiologist:  Ida Rogue, MD Electrophysiologist:  None   Chief Complaint    Sarah Payne is a 74 y.o. female with a hx of CAD, COPD, HLD, stress cardiomyopathy presents today for follow up of echo and carotid duplex.  Past Medical History    Past Medical History:  Diagnosis Date  . Acute systolic (congestive) heart failure ([place]) 01/01/2016  . Cataract    BILATERAL REMOVED  . Chronic low back pain   . COPD (chronic obstructive pulmonary disease) ([place])   . Coronary artery disease    a. multiple PCIs to the mLCx. b. stent to dRCA 08/2011 in setting of NSTEMI. c. DES to prox-mid LCx for ISR 09/2011. d.  DESx2 to prox-mRCA 07/2012. e. Takotsubo event 12/2015 with patent stents.  . Depression   . Diabetes mellitus   . Diverticulosis   . Frequent PVCs    a. Noted in hospital 12/2015.  Marland Kitchen GERD (gastroesophageal reflux disease)   . Hyperlipidemia   . Hypertension   . Hypothyroidism   . Impingement syndrome of left shoulder   . Marijuana abuse   . Myocardial infarction ([place])    x 5  . Sleep apnea    mild-does not use cpap  . Takotsubo cardiomyopathy    a. 12/2015 - nephew committed suicide 1 week prior, sister died the morning of presentation - initially called a STEMI; cath with patent stents. LVEF 25-30%.  . Tendonitis of left rotator cuff   . Tobacco abuse   . Vascular dementia    Past Surgical History:  Procedure Laterality Date  . ABDOMINAL HYSTERECTOMY    . APPENDECTOMY    . BACK SURGERY    . CARDIAC CATHETERIZATION  2013  . CARDIAC CATHETERIZATION  2016  . CARDIAC CATHETERIZATION N/A 01/01/2016   Procedure: Left Heart Cath and Coronary Angiography;  Surgeon: Jettie Booze, MD;  Location: [facility];  Service: Cardiovascular;  Laterality: N/A;  . CATARACT EXTRACTION W/PHACO Left 01/30/2015   Procedure: CATARACT EXTRACTION  PHACO AND INTRAOCULAR LENS PLACEMENT (IOC);  Surgeon: Birder Robson, MD;  Location: [facility];  Service: Ophthalmology;  Laterality: Left;  [place] 00:47   . CATARACT EXTRACTION W/PHACO Right 02/13/2015   Procedure: CATARACT EXTRACTION PHACO AND INTRAOCULAR LENS PLACEMENT (IOC);  Surgeon: Birder Robson, MD;  Location: [facility];  Service: Ophthalmology;  Laterality: Right;  cassette lot # [id number] H [place]  00:29.9 AP  20.7 CDE  6.20  . COLONOSCOPY N/A 11/02/2014   Procedure: COLONOSCOPY;  Surgeon: Inda Castle, MD;  Location: [facility];  Service: Endoscopy;  Laterality: N/A;  . CORONARY ANGIOPLASTY  2012   stent x 3   . CORONARY ANGIOPLASTY WITH STENT PLACEMENT  2013  . ESOPHAGOGASTRODUODENOSCOPY (EGD) WITH PROPOFOL N/A 09/21/2018   Procedure: ESOPHAGOGASTRODUODENOSCOPY (EGD) WITH PROPOFOL;  Surgeon: [name], MD;  Location: [facility];  Service: Gastroenterology;  Laterality: N/A;  . SHOULDER SURGERY Left 2017    Allergies  Allergies  Allergen Reactions  . No Known Allergies     History of Present Illness    Sarah Payne is a 74 y.o. female with a hx of CAD, COPD, HLD, DM2, tobacco abuse, stress cardiomyopathy, depression last seen 02/27/20 by Dr. Rockey Situ  She is in cardiac catheterization 12/2015 with patent stents in the circumflex and RCA.  Noted severely depressed LVEF with the pattern of  Takotsubo cardiomyopathy.  Echo 12/2015 EF 25-30% with wall motion abnormalities consider Takotsubo cardiomyopathy, grade 1 diastolic dysfunction.  Repeat echocardiogram 03/2016 EF 60 to 65%, noRWMA, grade 1 diastolic dysfunction, and trivial pericardial effusion, no significant valvular abnormalities.  She underwent carotid duplex 11/2016 with bilateral less than 39% stenosis.  She was seen in clinic 08/29/2019.  Noted lower extremity discomfort and was scheduled for ABIs.  Subsequent ABIs 10/04/2019 within normal range with no evidence of PAD.  Seen by Dr. Rockey Situ 02/27/2020 for 61-month follow-up.  She  reported no anginal symptoms.  Her blood pressure was well controlled.  Smoking cessation was encouraged.  No changes made.  Seen by primary care 04/02/2020 with 10-day history of dizziness with position changes.  Endorsed presyncopal symptoms.  Blood sugars at home routinely low 200s.  Blood pressure at home routinely 180s over 100s.  Poor p.o. intake noted.  Her EKG independently reviewed to normal sinus rhythm 60 bpm.  She had positive orthostatic vital changes 1 moving from laying to sitting position.  UA unremarkable.  Lab work 04/02/2020 creatinine 1.11, GFR 48.31, TSH 0.06.  Her levothyroxine dose was decreased to 75 mcg by her primary care provider.  She was seen in cardiology follow-up.  She was recommended for echocardiogram and carotid duplex.  Carotid duplex 04/2020 with stable bilateral 1-39% stenosis.  Echo 04/26/2020 with LVEF 50 to 42%, grade 2 diastolic dysfunction, no significant valvular abnormalities.  She enjoys dot painting at home to stay busy and playing games on her tablet.  Also enjoys spending time with her dog who is a Chiropractor.  Reports occasional lightheadedness but no recurrent near syncope nor syncope.  Endorses continued dyspnea on exertion.  We reviewed her testing results in detail.  We discussed indication for low-sodium diet.  EKGs/Labs/Other Studies Reviewed:   The following studies were reviewed today: Carotid duplex 04/26/2020 Summary:  Right Carotid: Velocities in the right ICA are consistent with a 1-39%  stenosis.                Non-hemodynamically significant plaque <50% noted in the  CCA.                The ECA appears <50% stenosed.   Left Carotid: Velocities in the left ICA are consistent with a 1-39%  stenosis.               Non-hemodynamically significant plaque <50% noted in the  CCA.               The ECA appears <50% stenosed.   Vertebrals:  Bilateral vertebral arteries demonstrate antegrade flow.  Subclavians: Normal flow hemodynamics were  seen in bilateral subclavian               arteries.   Echo 05/02/2020  1. Left ventricular ejection fraction, by estimation, is 50 to 55%. The  left ventricle has low normal function. The left ventricle has no regional  wall motion abnormalities. There is mild left ventricular hypertrophy.  Left ventricular diastolic  parameters are consistent with Grade II diastolic dysfunction  (pseudonormalization).   2. Right ventricular systolic function is normal. The right ventricular  size is normal. Mildly increased right ventricular wall thickness.   3. The mitral valve is normal in structure. No evidence of mitral valve  regurgitation. No evidence of mitral stenosis.   4. The aortic valve is grossly normal. Aortic valve regurgitation is not  visualized. Mild aortic valve sclerosis is present, with no evidence of  aortic valve stenosis.   5. The inferior vena cava is normal in size with greater than 50%  respiratory variability, suggesting right atrial pressure of 3 mmHg.   EKG: No EKG today.  Recent Labs: 08/29/2019: ALT 14 04/02/2020: BUN 19; Creatinine, Ser 1.11; Hemoglobin 13.7; Platelets 246.0; Potassium 4.0; Sodium 137; TSH 0.06  Recent Lipid Panel    Component Value Date/Time   CHOL 95 02/01/2020 1234   CHOL 200 (H) 08/29/2019 0947   CHOL 292 (H) 08/02/2014 0408   TRIG 130.0 02/01/2020 1234   TRIG 388 (H) 08/02/2014 0408   HDL 37.90 (L) 02/01/2020 1234   HDL 34 (L) 08/29/2019 0947   HDL 34 (L) 08/02/2014 0408   CHOLHDL 2 02/01/2020 1234   VLDL 26.0 02/01/2020 1234   VLDL 78 (H) 08/02/2014 0408   LDLCALC 31 02/01/2020 1234   LDLCALC 131 (H) 08/29/2019 0947   LDLCALC 180 (H) 08/02/2014 0408   LDLDIRECT 143.0 12/24/2018 1227    Home Medications   Current Meds  Medication Sig  . albuterol (VENTOLIN HFA) 108 (90 Base) MCG/ACT inhaler Inhale 2 puffs into the lungs every 6 (six) hours as needed for wheezing or shortness of breath.  Marland Kitchen aspirin 81 MG tablet Take 81 mg by mouth  daily.    . carvedilol (COREG) 6.25 MG tablet Take 1 tablet (6.25 mg total) by mouth 2 (two) times daily.  . clobetasol ointment (TEMOVATE) 0.05 % APPLY EVERY NIGHT TO VAGINAL FOLDS  . clopidogrel (PLAVIX) 75 MG tablet Take 1 tablet (75 mg total) by mouth daily.  . enalapril (VASOTEC) 20 MG tablet Take 1 tablet by mouth once daily for blood pressure.  Marland Kitchen estradiol (ESTRACE VAGINAL) 0.1 MG/GM vaginal cream Place 1 Applicatorful vaginally 3 (three) times a week.  . EUTHYROX 112 MCG tablet Take 112 mcg by mouth every morning.  . ezetimibe (ZETIA) 10 MG tablet Take 1 tablet (10 mg total) by mouth daily. For cholesterol.  . insulin aspart (NOVOLOG FLEXPEN) 100 UNIT/ML FlexPen Inject 20 units before lunch, and inject 25 units before dinner.  . insulin glargine (LANTUS SOLOSTAR) 100 UNIT/ML Solostar Pen Inject 30 Units into the skin 2 (two) times daily.  . Insulin Pen Needle (INSUPEN PEN NEEDLES) 32G X 4 MM MISC Use to inject insulin 4 times daily.  . isosorbide mononitrate (IMDUR) 30 MG 24 hr tablet Take 1 tablet (30 mg total) by mouth daily.  Marland Kitchen levothyroxine (SYNTHROID) 75 MCG tablet Take 1 tablet by mouth every morning on an empty stomach with water only.  No food or other medications for 30 minutes.  . rosuvastatin (CRESTOR) 40 MG tablet Take 1 tablet (40 mg total) by mouth every evening. For cholesterol.  . traZODone (DESYREL) 100 MG tablet TAKE 1 TABLET BY MOUTH AT BEDTIME AS NEEDED FOR SLEEP    Review of Systems   All other systems reviewed and are otherwise negative except as noted above.  Physical Exam    VS:  BP (!) 160/90 (BP Location: Left Arm, Patient Position: Sitting, Cuff Size: Normal)   Pulse 96   Ht 5\' 6"  (1.676 m)   Wt 150 lb (68 kg)   SpO2 98%   BMI 24.21 kg/m  , BMI Body mass index is 24.21 kg/m. GEN: Well nourished, well developed, in no acute distress. HEENT: normal. Neck: Supple, no JVD, carotid bruits, or masses. Cardiac: RRR, no murmurs, rubs, or gallops. No  clubbing, cyanosis, edema.  Radials 2+ and equal bilaterally. PT 1+ and equal bilaterally. Respiratory:  Respirations regular and unlabored, clear to auscultation bilaterally. GI: Soft, nontender, nondistended, BS + x 4. MS: No deformity or atrophy. Skin: Warm and dry, no rash. Neuro:  Strength and sensation are intact. Psych: Normal affect.  Assessment & Plan    1. HTN/orthostatic hypotension -BP mildly elevated today though at primary care office yesterday was well controlled.  Reports occasional lightheadedness but no recurrent near syncope nor syncope.  Carotid duplex without significant stenosis.  Echo with LVEF 50 to 94%, grade 2 diastolic dysfunction, no significant valvular maladies.  Continue to stay adequately hydrated and make position changes slowly.    2. Carotid artery stenosis -carotid duplex 05/03/2019 with bilateral 1-39% stenosis stable compared to previous study 2018.  Continue aspirin, Zetia, statin, Plavix.  3. CAD - Reports no chest pain, pressure, tightness.  Anticipate her worsening DOE is in the setting of diastolic dysfunction. Continue GDMT aspirin, plavix, beta blocker, long acting nitrate, statin, zetia.No indication for ischemic evaluation at this time.  4. HLD, LDL goal <70 - Lipid panel 02/01/20 with total cholesterol 95, triglycerides 130, LDL 31, HDL 37.9. Crestor 40mg  daily and Zetia 10mg  daily.  5. Chronic systolic heart failure - Euvolemic and well compensated on exam.  Notes continued dyspnea on exertion.  Echo 03/2016 LVEF 60-65%.  Echo 04/26/2020 LVEF 50 to 50%, grade 2 diastolic dysfunction.  Continue beta blocker/ARB. Continue low sodium diet.  Start Lasix 20 mg daily.  Will request primary care to add on BNP to her upcoming lab work.  6. Tobacco abuse - Smoking cessation encouraged. Not interested in quitting at this time.  Recommend utilization of [id number].   7. COPD - Reports cough stable at baseline.  Contributory to dyspnea.  Smoking cessation  encouraged. Follows with her PCP. Continue PRN Albuterol.  Disposition:  Follow up in 2 months with Dr. Rockey Situ or APP.   Loel Dubonnet, NP 05/04/2020, 2:31 PM

## 2020-05-04 NOTE — Patient Instructions (Addendum)
Medication Instructions:  Your physician has recommended you make the following change in your medication:   START Furosemide (Lasix) 20mg  daily  *If you need a refill on your cardiac medications before your next appointment, please call your pharmacy*   Lab Work: No lab work today. We will ask your primary care provider to add on a BMP to your upcoming lab work in November.   If you have labs (blood work) drawn today and your tests are completely normal, you will receive your results only by: Marland Kitchen MyChart Message (if you have MyChart) OR . A paper copy in the mail If you have any lab test that is abnormal or we need to change your treatment, we will call you to review the results.  Testing/Procedures: Your carotid ultrasound was good! It was stable compared to previous.   Your echocardiogram (ultrasound of the heart) shows that your heart pumping function is low normal. It showed your heart is moderately stiff - this is called diastolic dysfunction - it can cause shortness of breath.  Follow-Up: At [facility], you and your health needs are our priority.  As part of our continuing mission to provide you with exceptional heart care, we have created designated Provider Care Teams.  These Care Teams include your primary Cardiologist (physician) and Advanced Practice Providers (APPs -  Physician Assistants and Nurse Practitioners) who all work together to provide you with the care you need, when you need it.  We recommend signing up for the patient portal called "MyChart".  Sign up information is provided on this After Visit Summary.  MyChart is used to connect with patients for Virtual Visits (Telemedicine).  Patients are able to view lab/test results, encounter notes, upcoming appointments, etc.  Non-urgent messages can be sent to your provider as well.   To learn more about what you can do with MyChart, go to [email or web address].    Your next appointment:   2 month(s)  The format  for your next appointment:   In Person  Provider:   You may see Ida Rogue, MD or one of the following Advanced Practice Providers on your designated Care Team:    Murray Hodgkins, NP  Christell Faith, PA-C  [name], PA-C  Laurann Montana, NP  Cadence Kathlen Mody, [place]  Other Instructions   Recommend low salt diet. This will help prevent you from hanging onto extra fluid.

## 2020-05-17 ENCOUNTER — Other Ambulatory Visit: Payer: Self-pay | Admitting: Primary Care

## 2020-05-17 DIAGNOSIS — E039 Hypothyroidism, unspecified: Secondary | ICD-10-CM

## 2020-05-17 DIAGNOSIS — I10 Essential (primary) hypertension: Secondary | ICD-10-CM

## 2020-06-04 ENCOUNTER — Other Ambulatory Visit: Payer: Self-pay | Admitting: Primary Care

## 2020-06-04 ENCOUNTER — Other Ambulatory Visit (INDEPENDENT_AMBULATORY_CARE_PROVIDER_SITE_OTHER): Payer: Medicare PPO

## 2020-06-04 ENCOUNTER — Other Ambulatory Visit: Payer: Self-pay

## 2020-06-04 DIAGNOSIS — I10 Essential (primary) hypertension: Secondary | ICD-10-CM | POA: Diagnosis not present

## 2020-06-04 DIAGNOSIS — E039 Hypothyroidism, unspecified: Secondary | ICD-10-CM

## 2020-06-04 LAB — BASIC METABOLIC PANEL
BUN: 22 mg/dL (ref 6–23)
CO2: 31 mEq/L (ref 19–32)
Calcium: 9.7 mg/dL (ref 8.4–10.5)
Chloride: 97 mEq/L (ref 96–112)
Creatinine, Ser: 1.27 mg/dL — ABNORMAL HIGH (ref 0.40–1.20)
GFR: 42.32 mL/min — ABNORMAL LOW (ref >60.00)
Glucose, Bld: 162 mg/dL — ABNORMAL HIGH (ref 70–99)
Potassium: 4.3 mEq/L (ref 3.5–5.1)
Sodium: 135 mEq/L (ref 135–145)

## 2020-06-04 LAB — TSH: TSH: 7.56 u[IU]/mL — ABNORMAL HIGH (ref 0.35–4.50)

## 2020-06-04 MED ORDER — LEVOTHYROXINE SODIUM 88 MCG PO TABS: ORAL_TABLET | ORAL | 0 refills | Status: DC

## 2020-06-05 ENCOUNTER — Telehealth: Payer: Self-pay | Admitting: *Deleted

## 2020-06-05 DIAGNOSIS — I5189 Other ill-defined heart diseases: Secondary | ICD-10-CM

## 2020-06-05 MED ORDER — FUROSEMIDE 20 MG PO TABS: 20.0000 mg | ORAL_TABLET | ORAL | 2 refills | Status: DC

## 2020-06-05 NOTE — Telephone Encounter (Signed)
-----   Message from Loel Dubonnet, NP sent at 06/05/2020  7:42 AM EST ----- On 05/04/20 Sarah Payne was started on Lasix 20mg  daily. Her lab work with her PCP yesterday shows mild decline in kidney function with normal potassium. It is likely that she is mildly dehydrated from the fluid pill. Please call Sarah Payne and ask her to take her Lasix 20mg  every other day. Repeat labs can be checked at her clinic visit in December.    Thanks! Loel Dubonnet, NP

## 2020-06-05 NOTE — Telephone Encounter (Signed)
Patient's next appointment is 07/04/20 with Dr Rockey Situ.   Attempted to reach patient with no answer or VM after several rings.   Called patient's daughter, [name], ok per DPR. She verbalized understanding to decrease furosemide to 20 mg every other day and will let the patient know. Med list updated.

## 2020-06-07 ENCOUNTER — Ambulatory Visit: Payer: Medicare PPO | Admitting: Student in an Organized Health Care Education/Training Program

## 2020-06-07 DIAGNOSIS — H04222 Epiphora due to insufficient drainage, left lacrimal gland: Secondary | ICD-10-CM | POA: Diagnosis not present

## 2020-06-19 ENCOUNTER — Other Ambulatory Visit: Payer: Self-pay

## 2020-06-19 ENCOUNTER — Ambulatory Visit
Payer: Medicare PPO | Attending: Student in an Organized Health Care Education/Training Program | Admitting: Student in an Organized Health Care Education/Training Program

## 2020-06-19 ENCOUNTER — Encounter: Payer: Self-pay | Admitting: Student in an Organized Health Care Education/Training Program

## 2020-06-19 VITALS — BP 132/84 | HR 70 | Temp 97.0°F | Ht 67.0 in | Wt 148.0 lb

## 2020-06-19 DIAGNOSIS — M5136 Other intervertebral disc degeneration, lumbar region: Secondary | ICD-10-CM | POA: Insufficient documentation

## 2020-06-19 DIAGNOSIS — G894 Chronic pain syndrome: Secondary | ICD-10-CM | POA: Insufficient documentation

## 2020-06-19 DIAGNOSIS — M4716 Other spondylosis with myelopathy, lumbar region: Secondary | ICD-10-CM | POA: Insufficient documentation

## 2020-06-19 DIAGNOSIS — M47812 Spondylosis without myelopathy or radiculopathy, cervical region: Secondary | ICD-10-CM | POA: Insufficient documentation

## 2020-06-19 DIAGNOSIS — F17200 Nicotine dependence, unspecified, uncomplicated: Secondary | ICD-10-CM | POA: Diagnosis not present

## 2020-06-19 DIAGNOSIS — G8929 Other chronic pain: Secondary | ICD-10-CM | POA: Insufficient documentation

## 2020-06-19 DIAGNOSIS — Z720 Tobacco use: Secondary | ICD-10-CM | POA: Insufficient documentation

## 2020-06-19 DIAGNOSIS — M7918 Myalgia, other site: Secondary | ICD-10-CM | POA: Insufficient documentation

## 2020-06-19 DIAGNOSIS — G629 Polyneuropathy, unspecified: Secondary | ICD-10-CM | POA: Insufficient documentation

## 2020-06-19 MED ORDER — MELOXICAM 15 MG PO TABS: 15.0000 mg | ORAL_TABLET | Freq: Every day | ORAL | 5 refills | Status: AC

## 2020-06-19 MED ORDER — AMITRIPTYLINE HCL 50 MG PO TABS: 50.0000 mg | ORAL_TABLET | Freq: Every day | ORAL | 5 refills | Status: DC

## 2020-06-19 MED ORDER — GABAPENTIN 300 MG PO CAPS: 300.0000 mg | ORAL_CAPSULE | Freq: Three times a day (TID) | ORAL | 5 refills | Status: DC

## 2020-06-19 NOTE — Progress Notes (Signed)
Safety precautions to be maintained throughout the outpatient stay will include: orient to surroundings, keep bed in low position, maintain call bell within reach at all times, provide assistance with transfer out of bed and ambulation.  

## 2020-06-19 NOTE — Progress Notes (Signed)
PROVIDER NOTE: Information contained herein reflects review and annotations entered in association with encounter. Interpretation of such information and data should be left to medically-trained personnel. Information provided to patient can be located elsewhere in the medical record under "Patient Instructions". Document created using STT-dictation technology, any transcriptional errors that may result from process are unintentional.    Patient: Sarah Payne  Service Category: E/M  Provider: Gillis Santa, MD  DOB: [birth year]  DOS: 06/19/2020  Specialty: Interventional Pain Management  MRN: [id number]  Setting: Ambulatory outpatient  PCP: [name], NP  Type: Established Patient    Referring Provider: [name], NP  Location: Office  Delivery: Face-to-face     HPI  [name], a 74 y.o. year old female, is here today because of her Lumbar spondylosis with myelopathy [M47.16]. [name] primary complain today is Back Pain Last encounter: My last encounter with her was on 05/03/2020. Pertinent problems: [name] has CAD, multiple vessel; History of lumbar fusion; Diabetes mellitus type 2 with peripheral artery disease ([place]); Bronchitis, chronic obstructive ([place]); Obstructive sleep apnea; COPD, moderate ([place]); Lumbar degenerative disc disease; Lumbar spondylosis with myelopathy; Cannabis use disorder, mild, abuse; Peripheral polyneuropathy; Tobacco dependence; Chronic pain syndrome; Chronic neck pain; Chronic pain of right upper extremity; Cervical spondylosis; and Chronic musculoskeletal pain on their pertinent problem list. Pain Assessment: Severity of Chronic pain is reported as a 7 /10. Location: Back Lower, Mid/pain radiaties down to her knee. Onset: More than a month ago. Quality: Sharp. Timing: Constant. Modifying factor(s): meds. Vitals:  height is 5' 7"  (1.702 m) and weight is 148 lb (67.1 kg). Her temperature is 97 F (36.1 C) (abnormal). Her blood pressure is 132/84  and her pulse is 70. Her oxygen saturation is 100%.   Reason for encounter: medication management.   No change in medical history since last visit.  Patient's pain is at baseline.  Patient continues multimodal pain regimen as prescribed.  States that it provides pain relief and improvement in functional status.  ROS  Constitutional: Denies any fever or chills Gastrointestinal: No reported hemesis, hematochezia, vomiting, or acute GI distress Musculoskeletal: Low back pain Neurological: No reported episodes of acute onset apraxia, aphasia, dysarthria, agnosia, amnesia, paralysis, loss of coordination, or loss of consciousness  Medication Review  Insulin Pen Needle, albuterol, amitriptyline, aspirin, carvedilol, clobetasol ointment, clopidogrel, enalapril, estradiol, ezetimibe, furosemide, gabapentin, insulin aspart, insulin glargine, isosorbide mononitrate, levothyroxine, meloxicam, rosuvastatin, and traZODone  History Review  Allergy: [name] is allergic to no known allergies. Drug: [name]  reports current drug use. Drug: Marijuana. Alcohol:  reports no history of alcohol use. Tobacco:  reports that she has been smoking cigarettes. She has a 45.00 pack-year smoking history. She has never used smokeless tobacco. Social: [name]  reports that she has been smoking cigarettes. She has a 45.00 pack-year smoking history. She has never used smokeless tobacco. She reports current drug use. Drug: Marijuana. She reports that she does not drink alcohol. Medical:  has a past medical history of Acute systolic (congestive) heart failure ([place]) (01/01/2016), Cataract, Chronic low back pain, COPD (chronic obstructive pulmonary disease) ([place]), Coronary artery disease, Depression, Diabetes mellitus, Diverticulosis, Frequent PVCs, GERD (gastroesophageal reflux disease), Hyperlipidemia, Hypertension, Hypothyroidism, Impingement syndrome of left shoulder, Marijuana abuse, Myocardial infarction [facility]), Sleep  apnea, Takotsubo cardiomyopathy, Tendonitis of left rotator cuff, Tobacco abuse, and Vascular dementia. Surgical: [name]  has a past surgical history that includes Back surgery; Appendectomy; Abdominal hysterectomy; Colonoscopy (N/A, 11/02/2014); Cataract extraction  w/PHACO (Left, 01/30/2015); Cardiac catheterization (2013); Coronary angioplasty (2012); Coronary angioplasty with stent (2013); Cardiac catheterization (2016); Cataract extraction w/PHACO (Right, 02/13/2015); Cardiac catheterization (N/A, 01/01/2016); Esophagogastroduodenoscopy (egd) with propofol (N/A, 09/21/2018); and Shoulder surgery (Left, 2017). Family: family history includes Colon cancer in her sister; Heart attack in her mother; Heart disease in her father and mother; Liver cancer in her brother; Throat cancer in her brother.  Laboratory Chemistry Profile   Renal Lab Results  Component Value Date   Sarah Payne 22 06/04/2020   CREATININE 1.27 (H) 06/04/2020   BCR 14 08/29/2019   GFR 42.32 (L) 06/04/2020   GFRAA 58 (L) 08/29/2019   GFRNONAA 50 (L) 08/29/2019     Hepatic Lab Results  Component Value Date   AST 10 08/29/2019   ALT 14 08/29/2019   ALBUMIN 4.1 08/29/2019   ALKPHOS 91 08/29/2019   HCVAB NEGATIVE 03/12/2015   AMYLASE 32 04/11/2016   LIPASE 55.0 04/11/2016     Electrolytes Lab Results  Component Value Date   NA 135 06/04/2020   K 4.3 06/04/2020   CL 97 06/04/2020   CALCIUM 9.7 06/04/2020   MG 1.1 (L) 06/12/2014     Bone Lab Results  Component Value Date   VD25OH 41 02/25/2013     Inflammation (CRP: Acute Phase) (ESR: Chronic Phase) Lab Results  Component Value Date   CRP 2.1 04/11/2013   ESRSEDRATE 58 (H) 04/11/2013       Note: Above Lab results reviewed.  Recent Imaging Review  VAS US CAROTID Carotid Arterial Duplex Study  Indications:       Multiple episodes of near-syncope and some syncope with LOC. Risk Factors:      Hypertension, hyperlipidemia, Diabetes, current smoker,                     coronary artery disease, PAD. Comparison Study:  11/27/16 carotid duplex exam showed RICA velocities of 107/30                    cm/s and LICA velocities of 09/38 cm/s  Performing Technologist: Pilar Jarvis RDMS, RVT, RDCS    Examination Guidelines: A complete evaluation includes B-mode imaging, spectral Doppler, color Doppler, and power Doppler as needed of all accessible portions of each vessel. Bilateral testing is considered an integral part of a complete examination. Limited examinations for reoccurring indications may be performed as noted.    Right Carotid Findings: +----------+--------+--------+--------+-------------------------------+--------+           PSV cm/sEDV cm/sStenosisPlaque Description             Comments +----------+--------+--------+--------+-------------------------------+--------+ CCA Prox  69      20              homogeneous, smooth and diffuse         +----------+--------+--------+--------+-------------------------------+--------+ CCA Mid                           homogeneous, smooth and diffuse         +----------+--------+--------+--------+-------------------------------+--------+ CCA Distal92      21                                                      +----------+--------+--------+--------+-------------------------------+--------+ ICA Prox  76      13  1-39%   focal and calcific                      +----------+--------+--------+--------+-------------------------------+--------+ ICA Mid   52      14      1-39%                                           +----------+--------+--------+--------+-------------------------------+--------+ ICA Distal47      16                                                      +----------+--------+--------+--------+-------------------------------+--------+ ECA       124     22                                                       +----------+--------+--------+--------+-------------------------------+--------+  +----------+--------+-------+----------------+-------------------+           PSV cm/sEDV cmsDescribe        Arm Pressure (mmHG) +----------+--------+-------+----------------+-------------------+ [id number]             Multiphasic, [id number]                 +----------+--------+-------+----------------+-------------------+  +---------+--------+--+--------+--+---------+ VertebralPSV cm/s33EDV cm/s13Antegrade +---------+--------+--+--------+--+---------+     Left Carotid Findings: +----------+--------+--------+--------+-------------------------------+--------+           PSV cm/sEDV cm/sStenosisPlaque Description             Comments +----------+--------+--------+--------+-------------------------------+--------+ CCA Prox  66      15              hypoechoic, homogeneous, smooth                                           and diffuse                             +----------+--------+--------+--------+-------------------------------+--------+ CCA Mid                           hypoechoic, diffuse,                                                      homogeneous and smooth                  +----------+--------+--------+--------+-------------------------------+--------+ CCA Distal89      22              diffuse, hypoechoic,                                                      homogeneous and smooth                  +----------+--------+--------+--------+-------------------------------+--------+  ICA Prox  54      13      1-39%   hyperechoic and smooth                  +----------+--------+--------+--------+-------------------------------+--------+ ICA Mid   52      19      1-39%                                           +----------+--------+--------+--------+-------------------------------+--------+ ICA Distal83      30                                                       +----------+--------+--------+--------+-------------------------------+--------+ ECA       109     10                                                      +----------+--------+--------+--------+-------------------------------+--------+  +----------+--------+--------+----------------+-------------------+           PSV cm/sEDV cm/sDescribe        Arm Pressure (mmHG) +----------+--------+--------+----------------+-------------------+ [id number]              Multiphasic, [id number]                 +----------+--------+--------+----------------+-------------------+  +---------+--------+--+--------+--+---------+ VertebralPSV cm/s62EDV cm/s19Antegrade +---------+--------+--+--------+--+---------+        Summary: Right Carotid: Velocities in the right ICA are consistent with a 1-39% stenosis.                Non-hemodynamically significant plaque <50% noted in the CCA.                The ECA appears <50% stenosed.  Left Carotid: Velocities in the left ICA are consistent with a 1-39% stenosis.               Non-hemodynamically significant plaque <50% noted in the CCA.               The ECA appears <50% stenosed.  Vertebrals:  Bilateral vertebral arteries demonstrate antegrade flow. Subclavians: Normal flow hemodynamics were seen in bilateral subclavian              arteries.  *See table(s) above for measurements and observations.    Electronically signed by Ida Rogue MD on 05/02/2020 at 9:39:57 PM.      Final   ECHOCARDIOGRAM COMPLETE    ECHOCARDIOGRAM REPORT       Patient Name:   Sarah Payne Date of Exam: 05/02/2020 Medical Rec #:  [id number]     Height:       66.0 in Accession #:    [id number]    Weight:       147.5 lb Date of Birth:  26-May-1946     BSA:          1.757 m Patient Age:    63 years      BP:           122/82 mmHg Patient Gender: F             HR:  64 bpm. Exam Location:   [place]  Procedure: 2D Echo, Cardiac Doppler and Color Doppler  Indications:    R55 Syncope; R06.02 SOB   History:        Patient has prior history of Echocardiogram examinations, most                 recent 04/09/2016. H/o Takotsubo CMY, COPD,                 Signs/Symptoms:Syncope and Shortness of Breath; Risk                 Factors:Current Smoker and Sleep Apnea.   Sonographer:    Pilar Jarvis RDMS, RVT, RDCS Referring Phys: [id number] [place]   1. Left ventricular ejection fraction, by estimation, is 50 to 55%. The left ventricle has low normal function. The left ventricle has no regional wall motion abnormalities. There is mild left ventricular hypertrophy. Left ventricular diastolic  parameters are consistent with Grade II diastolic dysfunction (pseudonormalization).  2. Right ventricular systolic function is normal. The right ventricular size is normal. Mildly increased right ventricular wall thickness.  3. The mitral valve is normal in structure. No evidence of mitral valve regurgitation. No evidence of mitral stenosis.  4. The aortic valve is grossly normal. Aortic valve regurgitation is not visualized. Mild aortic valve sclerosis is present, with no evidence of aortic valve stenosis.  5. The inferior vena cava is normal in size with greater than 50% respiratory variability, suggesting right atrial pressure of 3 mmHg.  FINDINGS  Left Ventricle: Left ventricular ejection fraction, by estimation, is 50 to 55%. The left ventricle has low normal function. The left ventricle has no regional wall motion abnormalities. The left ventricular internal cavity size was normal in size.  There is mild left ventricular hypertrophy. Left ventricular diastolic parameters are consistent with Grade II diastolic dysfunction (pseudonormalization).  Right Ventricle: The right ventricular size is normal. Mildly increased right ventricular wall thickness. Right ventricular systolic  function is normal.  Left Atrium: Left atrial size was normal in size.  Right Atrium: Right atrial size was normal in size.  Pericardium: There is no evidence of pericardial effusion.  Mitral Valve: The mitral valve is normal in structure. No evidence of mitral valve regurgitation. No evidence of mitral valve stenosis.  Tricuspid Valve: The tricuspid valve is normal in structure. Tricuspid valve regurgitation is not demonstrated. No evidence of tricuspid stenosis.  Aortic Valve: The aortic valve is grossly normal. Aortic valve regurgitation is not visualized. Mild aortic valve sclerosis is present, with no evidence of aortic valve stenosis. Aortic valve mean gradient measures 2.0 mmHg. Aortic valve peak gradient  measures 4.1 mmHg. Aortic valve area, by VTI measures 2.69 cm.  Pulmonic Valve: The pulmonic valve was not well visualized. Pulmonic valve regurgitation is not visualized. No evidence of pulmonic stenosis.  Aorta: The aortic root is normal in size and structure.  Venous: The inferior vena cava is normal in size with greater than 50% respiratory variability, suggesting right atrial pressure of 3 mmHg.  IAS/Shunts: No atrial level shunt detected by color flow Doppler.    LEFT VENTRICLE PLAX 2D LVIDd:         3.90 cm     Diastology LVIDs:         3.00 cm     LV e' medial:    2.95 cm/s LV PW:         1.10 cm     LV E/e'  medial:  20.6 LV IVS:        1.30 cm     LV e' lateral:   4.33 cm/s LVOT diam:     2.00 cm     LV E/e' lateral: 14.0 LV SV:         54 LV SV Index:   31 LVOT Area:     3.14 cm   LV Volumes (MOD) LV vol d, MOD A2C: 62.2 ml LV vol d, MOD A4C: 68.2 ml LV vol s, MOD A2C: 31.3 ml LV vol s, MOD A4C: 33.2 ml LV SV MOD A2C:     30.9 ml LV SV MOD A4C:     68.2 ml LV SV MOD BP:      32.7 ml  RIGHT VENTRICLE            IVC RV Basal diam:  2.80 cm    IVC diam: 1.30 cm RV S prime:     8.59 cm/s TAPSE (M-mode): 1.9 cm  LEFT ATRIUM           Index       RIGHT  ATRIUM           Index LA diam:      2.80 cm 1.59 cm/m  RA Area:     10.10 cm LA Vol (A2C): 28.8 ml 16.39 ml/m RA Volume:   19.80 ml  11.27 ml/m LA Vol (A4C): 32.6 ml 18.55 ml/m  AORTIC VALVE                   PULMONIC VALVE AV Area (Vmax):    2.82 cm    PV Vmax:       1.34 m/s AV Area (Vmean):   2.77 cm    PV Peak grad:  7.2 mmHg AV Area (VTI):     2.69 cm AV Vmax:           101.00 cm/s AV Vmean:          73.900 cm/s AV VTI:            0.202 m AV Peak Grad:      4.1 mmHg AV Mean Grad:      2.0 mmHg LVOT Vmax:         90.70 cm/s LVOT Vmean:        65.100 cm/s LVOT VTI:          0.173 m LVOT/AV VTI ratio: 0.86   AORTA Ao Root diam: 3.20 cm Ao Asc diam:  3.20 cm  MITRAL VALVE MV Area (PHT): 2.37 cm    SHUNTS MV Decel Time: 320 msec    Systemic VTI:  0.17 m MV E velocity: 60.80 cm/s  Systemic Diam: 2.00 cm MV A velocity: 83.10 cm/s MV E/A ratio:  0.73  [name] Sable MD Electronically signed by [name] Sable MD Signature Date/Time: 05/02/2020/6:50:17 PM      Final   Note: Reviewed        Physical Exam  General appearance: Well nourished, well developed, and well hydrated. In no apparent acute distress Mental status: Alert, oriented x 3 (person, place, & time)       Respiratory: No evidence of acute respiratory distress Eyes: PERLA Vitals: BP 132/84   Pulse 70   Temp (!) 97 F (36.1 C)   Ht 5' 7"  (1.702 m)   Wt 148 lb (67.1 kg)   SpO2 100%   BMI 23.18 kg/m  BMI: Estimated body mass index is 23.18 kg/m as calculated  from the following:   Height as of this encounter: 5' 7"  (1.702 m).   Weight as of this encounter: 148 lb (67.1 kg). Ideal: Ideal body weight: 61.6 kg (135 lb 12.9 oz) Adjusted ideal body weight: 63.8 kg (140 lb 10.9 oz)  Lumbar Spine Area Exam  Skin & Axial Inspection: No masses, redness, or swelling Alignment: Symmetrical Functional ROM: Unrestricted ROM       Stability: No instability detected Muscle Tone/Strength: Functionally  intact. No obvious neuro-muscular anomalies detected. Sensory (Neurological): Musculoskeletal pain pattern  Lower Extremity Exam    Side: Right lower extremity  Side: Left lower extremity  Stability: No instability observed          Stability: No instability observed          Skin & Extremity Inspection: Skin color, temperature, and hair growth are WNL. No peripheral edema or cyanosis. No masses, redness, swelling, asymmetry, or associated skin lesions. No contractures.  Skin & Extremity Inspection: Skin color, temperature, and hair growth are WNL. No peripheral edema or cyanosis. No masses, redness, swelling, asymmetry, or associated skin lesions. No contractures.  Functional ROM: Unrestricted ROM                  Functional ROM: Unrestricted ROM                  Muscle Tone/Strength: Functionally intact. No obvious neuro-muscular anomalies detected.  Muscle Tone/Strength: Functionally intact. No obvious neuro-muscular anomalies detected.  Sensory (Neurological): Unimpaired        Sensory (Neurological): Unimpaired        DTR: Patellar: deferred today Achilles: deferred today Plantar: deferred today  DTR: Patellar: deferred today Achilles: deferred today Plantar: deferred today  Palpation: No palpable anomalies  Palpation: No palpable anomalies    Assessment   Status Diagnosis  Controlled Controlled Controlled 1. Lumbar spondylosis with myelopathy   2. Peripheral polyneuropathy   3. Chronic pain syndrome   4. Lumbar degenerative disc disease   5. Chronic musculoskeletal pain   6. Osteoarthritis of cervical spine, unspecified spinal osteoarthritis complication status      Updated Problems: Problem  Chronic Musculoskeletal Pain  Chronic Neck Pain  Chronic Pain of Right Upper Extremity  Cervical Spondylosis  Chronic Pain Syndrome  Lumbar Degenerative Disc Disease  Lumbar Spondylosis With Myelopathy  Cannabis Use Disorder, Mild, Abuse  Peripheral Polyneuropathy  Tobacco  Dependence  COPD, moderate (HCC)   03/29/2013 simple spirometry > ratio 65% FEV1 1.54L (60% pred)   Obstructive Sleep Apnea   Mild, By recent study,  however she was noted to develop persistent hypoxemia and arrhythmias with sleep. CPAP ordered    Bronchitis, chronic obstructive (HCC)   Pulmonary function tests done at [facility] 03/18/2013  Forced vital capacity 2.6 L he 5% of predicted  Forced expiratory volume 1.2 L 67% of predicted  Forced expiratory volume/forced vital capacity ratio 75% of predicted. Diffusion 72% of predicted  Vital capacity 2.8 L 88% predicted  Residual volume 1.45 L 68% of predicted  Total lung capacity 4.25 L 78% of predicted.  Volume/Flow Loops : normal. These were read by Dr. Mortimer Fries   Diabetes Mellitus Type 2 With Peripheral Artery Disease (Hcc)   ABIs done by [facility] Aug 2014 1.22 right   1.23 left    History of Lumbar Fusion  Cad, Multiple Vessel   Recent NSTEMI and cardiac cath x 2 with PTCA x 3 of RCA and LC.      Plan of Care  Sarah Payne has a current medication list which includes the following long-term medication(s): carvedilol, trazodone, albuterol, amitriptyline, enalapril, ezetimibe, furosemide, gabapentin, novolog flexpen, lantus solostar, isosorbide mononitrate, levothyroxine, and rosuvastatin.  Pharmacotherapy (Medications Ordered): Meds ordered this encounter  Medications  . gabapentin (NEURONTIN) 300 MG capsule    Sig: Take 1 capsule (300 mg total) by mouth 3 (three) times daily.    Dispense:  90 capsule    Refill:  5  . meloxicam (MOBIC) 15 MG tablet    Sig: Take 1 tablet (15 mg total) by mouth daily.    Dispense:  30 tablet    Refill:  5  . amitriptyline (ELAVIL) 50 MG tablet    Sig: Take 1 tablet (50 mg total) by mouth at bedtime.    Dispense:  30 tablet    Refill:  5   Smoking cessation instruction/counseling given:  counseled patient on the dangers of tobacco use, advised patient to stop smoking, and reviewed strategies  to maximize success  Follow-up plan:   Return in about 6 months (around 12/17/2020) for Medication Management, in person.   Recent Visits No visits were found meeting these conditions. Showing recent visits within past 90 days and meeting all other requirements Today's Visits Date Type Provider Dept  06/19/20 Office Visit Gillis Santa, MD [facility]  Showing today's visits and meeting all other requirements Future Appointments No visits were found meeting these conditions. Showing future appointments within next 90 days and meeting all other requirements  I discussed the assessment and treatment plan with the patient. The patient was provided an opportunity to ask questions and all were answered. The patient agreed with the plan and demonstrated an understanding of the instructions.  Patient advised to call back or seek an in-person evaluation if the symptoms or condition worsens.  Duration of encounter: 90mnutes.  Note by: BGillis Santa MD Date: 06/19/2020; Time: 11:22 AM

## 2020-07-04 ENCOUNTER — Other Ambulatory Visit: Payer: Self-pay

## 2020-07-04 ENCOUNTER — Encounter: Payer: Self-pay | Admitting: Cardiovascular Disease

## 2020-07-04 ENCOUNTER — Ambulatory Visit (INDEPENDENT_AMBULATORY_CARE_PROVIDER_SITE_OTHER): Payer: Medicare PPO | Admitting: Cardiovascular Disease

## 2020-07-04 VITALS — BP 138/86 | HR 82 | Ht 67.0 in | Wt 154.0 lb

## 2020-07-04 DIAGNOSIS — I2510 Atherosclerotic heart disease of native coronary artery without angina pectoris: Secondary | ICD-10-CM | POA: Diagnosis not present

## 2020-07-04 DIAGNOSIS — I10 Essential (primary) hypertension: Secondary | ICD-10-CM | POA: Diagnosis not present

## 2020-07-04 DIAGNOSIS — I6523 Occlusion and stenosis of bilateral carotid arteries: Secondary | ICD-10-CM | POA: Diagnosis not present

## 2020-07-04 DIAGNOSIS — I5189 Other ill-defined heart diseases: Secondary | ICD-10-CM

## 2020-07-04 DIAGNOSIS — E782 Mixed hyperlipidemia: Secondary | ICD-10-CM

## 2020-07-04 MED ORDER — ENALAPRIL MALEATE 20 MG PO TABS: ORAL_TABLET | ORAL | 3 refills | Status: DC

## 2020-07-04 MED ORDER — ISOSORBIDE MONONITRATE ER 30 MG PO TB24
30.0000 mg | ORAL_TABLET | Freq: Every day | ORAL | 3 refills | Status: DC
Start: 2020-07-04 — End: 2021-01-04

## 2020-07-04 MED ORDER — VARENICLINE TARTRATE 1 MG PO TABS: 1.0000 mg | ORAL_TABLET | Freq: Two times a day (BID) | ORAL | 3 refills | Status: DC

## 2020-07-04 MED ORDER — ROSUVASTATIN CALCIUM 40 MG PO TABS: 40.0000 mg | ORAL_TABLET | Freq: Every evening | ORAL | 3 refills | Status: DC

## 2020-07-04 MED ORDER — CLOPIDOGREL BISULFATE 75 MG PO TABS: 75.0000 mg | ORAL_TABLET | Freq: Every day | ORAL | 3 refills | Status: DC

## 2020-07-04 MED ORDER — EZETIMIBE 10 MG PO TABS: 10.0000 mg | ORAL_TABLET | Freq: Every day | ORAL | 3 refills | Status: DC

## 2020-07-04 NOTE — Progress Notes (Signed)
Cardiology Office Note  Date:  07/04/2020   ID:  Sarah Payne, Sarah Payne 03/14/1946, MRN [id number]  PCP:  [name], NP   Chief Complaint  Patient presents with  . Follow-up    2 Months follow up and c/o having bilateral calf pain.  Medications verbally reviewed with patient.     HPI:  74 year old woman with a long history of  smoking,  coronary artery disease,  prior stent x3 placed to the mid left circumflex with moderate to severe LAD disease ,  stent placed to her distal RCA in February 2013,  stent to the mid left circumflex in March 2013 ,  poorly controlled diabetes with hemoglobin A1c of 10,  depression,  hypertension,  cardiac catheterization 08/19/2012 showing moderate mid LAD disease at the takeoff of the diagonal vessel, severe ostial to mid RCA disease, ejection fraction greater than 55%. She had FFR pressure wire showing severe disease of the RCA with drug-eluting stent x2 placed. C. difficile late 2015 after antibiotics Previous history of running out of her medications cardiac catheterization performed showing patent stents 12/2015 She presents today for follow-up of her coronary artery disease,   Recent thyroid issues,sugars high, hyperthyroidism Dose of thyroid medication adjusted per the patient Now feeling better  Seen several months ago, started on Lasix daily by our group Pumping renal function, cut back to Lasix every other day Still having constipation, dry mouth Denies shortness of breath, leg swelling, abdominal distention  HBA1C 10.3 three months ago, Feels like the numbers will be better on the next check  On crestor, zetia daily Total chol <100  Still smoking, 1 ppd  Left eye tearing "like crazy"  Also with significant left leg weakness with walking, calf gets weak, almost falls down Sx started 2-3 months ago, getting worse   Echo 10/21 reviewed 1. Left ventricular ejection fraction, by estimation, is 50 to 55%. The  left  ventricle has low normal function. The left ventricle has no regional  wall motion abnormalities. There is mild left ventricular hypertrophy.  Left ventricular diastolic  parameters are consistent with Grade II diastolic dysfunction  (pseudonormalization).  2. Right ventricular systolic function is normal. The right ventricular  size is normal. Mildly increased right ventricular wall thickness.  3. The mitral valve is normal in structure. No evidence of mitral valve  regurgitation. No evidence of mitral stenosis.  4. The aortic valve is grossly normal. Aortic valve regurgitation is not  visualized. Mild aortic valve sclerosis is present, with no evidence of  aortic valve stenosis.  5. The inferior vena cava is normal in size with greater than 50%  respiratory variability, suggesting right atrial pressure of 3 mmHg.   Other past medical history reviewed  Chronic severe back pain, Seen by neurosurgery.  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 as ejection fraction was depressed Echocardiogram June 2017, ejection fraction 25-30% She had follow-up echocardiogram   September 2017 showing normal LV functio   PMH:   has a past medical history of Acute systolic (congestive) heart failure ([place]) (01/01/2016), Cataract, Chronic low back pain, COPD (chronic obstructive pulmonary disease) ([place]), Coronary artery disease, Depression, Diabetes mellitus, Diverticulosis,  Frequent PVCs, GERD (gastroesophageal reflux disease), Hyperlipidemia, Hypertension, Hypothyroidism, Impingement syndrome of left shoulder, Marijuana abuse, Myocardial infarction  [facility]), Sleep apnea, Takotsubo cardiomyopathy, Tendonitis of left rotator cuff, Tobacco abuse, and Vascular dementia.  PSH:    Past Surgical History:  Procedure Laterality Date  . ABDOMINAL HYSTERECTOMY    . APPENDECTOMY    . BACK SURGERY    . CARDIAC CATHETERIZATION  2013  . CARDIAC CATHETERIZATION  2016  . CARDIAC CATHETERIZATION N/A 01/01/2016   Procedure: Left Heart Cath and Coronary Angiography;  Surgeon: Jettie Booze, MD;  Location: [place] [facility];  Service: Cardiovascular;  Laterality: N/A;  . CATARACT EXTRACTION W/PHACO Left 01/30/2015   Procedure: CATARACT EXTRACTION PHACO AND INTRAOCULAR LENS PLACEMENT (IOC);  Surgeon: Birder Robson, MD;  Location: [facility] OR[facility];  Service: Ophthalmology;  Laterality: Left;  [place] 00:47   . CATARACT EXTRACTION W/PHACO Right 02/13/2015   Procedure: CATARACT EXTRACTION PHACO AND INTRAOCULAR LENS PLACEMENT (IOC);  Surgeon: Birder Robson, MD;  Location: [facility] OR[facility];  Service: Ophthalmology;  Laterality: Right;  cassette lot # [id number] H [place]  00:29.9 AP  20.7 CDE  6.20  . COLONOSCOPY N/A 11/02/2014   Procedure: COLONOSCOPY;  Surgeon: Inda Castle, MD;  Location: [place];  Service: Endoscopy;  Laterality: N/A;  . CORONARY ANGIOPLASTY  2012   stent x 3   . CORONARY ANGIOPLASTY WITH STENT PLACEMENT  2013  . ESOPHAGOGASTRODUODENOSCOPY (EGD) WITH PROPOFOL N/A 09/21/2018   Procedure: ESOPHAGOGASTRODUODENOSCOPY (EGD) WITH PROPOFOL;  Surgeon: [name], MD;  [facility] END[facility];  Service: Gastroenterology;  Laterality: N/A;  . SHOULDER SURGERY Left 2017    Current Outpatient Medications  Medication Sig Dispense Refill  . albuterol (VENTOLIN HFA) 108 (90 Base) MCG/ACT inhaler Inhale 2 puffs into the lungs every 6 (six) hours as needed for wheezing or shortness of breath. 8 g 0  . amitriptyline (ELAVIL) 50 MG tablet Take 1 tablet (50 mg total) by mouth at bedtime. 30 tablet 5  . carvedilol (COREG) 6.25 MG tablet Take 1 tablet (6.25 mg total) by  mouth 2 (two) times daily. 180 tablet 3  . clobetasol ointment (TEMOVATE) 0.05 % APPLY EVERY NIGHT TO VAGINAL FOLDS 30 g 0  . clopidogrel (PLAVIX) 75 MG tablet Take 1 tablet (75 mg total) by mouth daily. 90 tablet 3  . enalapril (VASOTEC) 20 MG tablet Take 1 tablet by mouth once daily for blood pressure. 90 tablet 3  . ezetimibe (ZETIA) 10 MG tablet Take 1 tablet (10 mg total) by mouth daily. For cholesterol. 90 tablet 3  . furosemide (LASIX) 20 MG tablet Take 1 tablet (20 mg total) by mouth every other day. 30 tablet 2  . gabapentin (NEURONTIN) 300 MG capsule Take 1 capsule (300 mg total) by mouth 3 (three) times daily. 90 capsule 5  . insulin aspart (NOVOLOG FLEXPEN) 100 UNIT/ML FlexPen Inject 20 units before lunch, and inject 25 units before dinner. 15 mL 0  . insulin glargine (LANTUS SOLOSTAR) 100 UNIT/ML Solostar Pen Inject 30 Units into the skin 2 (two) times daily. 30 mL 3  . Insulin Pen Needle (INSUPEN PEN NEEDLES) 32G X 4 MM MISC Use to inject insulin 4 times daily. 250 each 1  . isosorbide mononitrate (IMDUR) 30 MG 24 hr tablet Take 1 tablet (30 mg total) by mouth daily. 90 tablet 3  . levothyroxine (SYNTHROID) 88 MCG tablet Take 1 tablet by mouth every morning on an empty stomach with water only.  No food or other medications for 30 minutes. [place]  tablet 0  . meloxicam (MOBIC) 15 MG tablet Take 1 tablet (15 mg total) by mouth daily. 30 tablet 5  . rosuvastatin (CRESTOR) 40 MG tablet Take 1 tablet (40 mg total) by mouth every evening. For cholesterol. 90 tablet 3  . traZODone (DESYREL) 100 MG tablet TAKE 1 TABLET BY MOUTH AT BEDTIME AS NEEDED FOR SLEEP 90 tablet 1   No current facility-administered medications for this visit.     Allergies:   No known allergies   Social History:  The patient  reports that she has been smoking cigarettes. She has a 45.00 pack-year smoking history. She has never used smokeless tobacco. She reports current drug use. Drug: Marijuana. She reports that she  does not drink alcohol.   Family History:   family history includes Colon cancer in her sister; Heart attack in her mother; Heart disease in her father and mother; Liver cancer in her brother; Throat cancer in her brother.    Review of Systems: Review of Systems  Constitutional: Negative.   HENT: Negative.   Respiratory: Negative.   Cardiovascular: Negative.   Gastrointestinal: Negative.   Musculoskeletal: Negative.   Neurological: Negative.   Psychiatric/Behavioral: Negative.   All other systems reviewed and are negative.    PHYSICAL EXAM: VS:  BP 138/86 (BP Location: Left Arm, Patient Position: Sitting, Cuff Size: Normal)   Pulse 82   Ht 5\' 7"  (1.702 m)   Wt 154 lb (69.9 kg)   SpO2 98%   BMI 24.12 kg/m  , BMI Body mass index is 24.12 kg/m. Constitutional:  oriented to person, place, and time. No distress.  HENT:  Head: Grossly normal Eyes:  no discharge. No scleral icterus.  Neck: No JVD, no carotid bruits  Cardiovascular: Regular rate and rhythm, no murmurs appreciated Pulmonary/Chest: Clear to auscultation bilaterally, no wheezes or rails Abdominal: Soft.  no distension.  no tenderness.  Musculoskeletal: Normal range of motion Neurological:  normal muscle tone. Coordination normal. No atrophy Skin: Skin warm and dry Psychiatric: normal affect, pleasant  Recent Labs: 08/29/2019: ALT 14 04/02/2020: Hemoglobin 13.7; Platelets 246.0 06/04/2020: BUN 22; Creatinine, Ser 1.27; Potassium 4.3; Sodium 135; TSH 7.56    Lipid Panel Lab Results  Component Value Date   CHOL 95 02/01/2020   HDL 37.90 (L) 02/01/2020   LDLCALC 31 02/01/2020   TRIG 130.0 02/01/2020     Wt Readings from Last 3 Encounters:  07/04/20 154 lb (69.9 kg)  06/19/20 148 lb (67.1 kg)  05/04/20 150 lb (68 kg)     ASSESSMENT AND PLAN:  CAD, multiple vessel -  Currently with no symptoms of angina. No further workup at this time. Continue current medication regimen. Long discussion concerning  smoking cessation, better diabetes control A1c chronically elevated  Mixed hyperlipidemia -  Stay on Crestor with Zetia, goal LDL less than 70  Diabetes mellitus type 2 with peripheral artery disease (HCC) -  A1c typically running 9 up to 10 Reports she is watching her diet closely In the past has worked with endocrine  Chronic systolic congestive heart failure ([place]) -  on ACE inhibitor, Imdur, carvedilol  Recommend she take Lasix as needed She is having constipation, climbing creatinine  Essential hypertension -  Blood pressure is well controlled on today's visit. No changes made to the medications.  Tobacco abuse - We have sent in generic Chantix to the [facility] She reports regular Chantix branded through [facility] was too expensive Discussed other strategies for smoking cessation  COPD, moderate ([place]) -  Still smoking, Chronic cough, stable  Leg weakness Worsening symptoms past 2 to 3 months left leg, gives out, goes cold and numb Unable to differentiate between a neuropathy versus PAD High risk of underlying PAD, has never had arterial Dopplers only ABIs Has not had aorta evaluated in many years apart from a CT scan 2017 showing severe disease Aortoiliac and lower extremity arterial Dopplers ordered    Total encounter time more than 25 minutes  Greater than 50% was spent in counseling and coordination of care with the patient   No orders of the defined types were placed in this encounter.    Signed, Esmond Plants, M.D., Ph.D. 07/04/2020  [place], [place]

## 2020-07-04 NOTE — Patient Instructions (Addendum)
Medication Instructions:  Lasix/furosemide daily only as needed for leg edema/swelling, SOB, fluid retention   If you need a refill on your cardiac medications before your next appointment, please call your pharmacy.    Lab work: No new labs needed   If you have labs (blood work) drawn today and your tests are completely normal, you will receive your results only by: Marland Kitchen MyChart Message (if you have MyChart) OR . A paper copy in the mail If you have any lab test that is abnormal or we need to change your treatment, we will call you to review the results.   Testing/Procedures:  [place] of aorta and bilateral lower extremities to rule out any blockages or DVT (Deep vein thrombosis) is when a blood clot forms in a deep vein. These clots usually develop in the lower leg, thigh, or pelvis    Follow-Up: At [facility], you and your health needs are our priority.  As part of our continuing mission to provide you with exceptional heart care, we have created designated Provider Care Teams.  These Care Teams include your primary Cardiologist (physician) and Advanced Practice Providers (APPs -  Physician Assistants and Nurse Practitioners) who all work together to provide you with the care you need, when you need it.  . You will need a follow up appointment in 6 months  . Providers on your designated Care Team:   . Murray Hodgkins, NP . Christell Faith, PA-C . [name], PA-C  Any Other Special Instructions Will Be Listed Below (If Applicable).  COVID-19 Vaccine Information can be found at: ShippingScam.co.uk For questions related to vaccine distribution or appointments, please email [email or web address] or call [phone].

## 2020-07-05 DIAGNOSIS — H04222 Epiphora due to insufficient drainage, left lacrimal gland: Secondary | ICD-10-CM | POA: Diagnosis not present

## 2020-07-17 ENCOUNTER — Telehealth: Payer: Self-pay

## 2020-07-17 ENCOUNTER — Other Ambulatory Visit: Payer: Self-pay

## 2020-07-17 MED ORDER — INSUPEN PEN NEEDLES 32G X 4 MM MISC: 1 refills | Status: AC

## 2020-07-17 NOTE — Telephone Encounter (Signed)
Called and let patient know needles sent to Mail order. Will call if any issues.

## 2020-07-17 NOTE — Telephone Encounter (Signed)
Pt left v/m that diabetic needles were sent to [facility] and are too expensive and pt request the diabetic needles be sent to [facility] meds by mail.

## 2020-07-25 ENCOUNTER — Other Ambulatory Visit: Payer: Self-pay | Admitting: Cardiovascular Disease

## 2020-07-25 DIAGNOSIS — R29898 Other symptoms and signs involving the musculoskeletal system: Secondary | ICD-10-CM

## 2020-07-25 DIAGNOSIS — R209 Unspecified disturbances of skin sensation: Secondary | ICD-10-CM

## 2020-08-03 ENCOUNTER — Ambulatory Visit (INDEPENDENT_AMBULATORY_CARE_PROVIDER_SITE_OTHER): Payer: Medicare PPO | Admitting: Primary Care

## 2020-08-03 ENCOUNTER — Encounter: Payer: Self-pay | Admitting: Primary Care

## 2020-08-03 ENCOUNTER — Other Ambulatory Visit: Payer: Self-pay

## 2020-08-03 VITALS — BP 164/88 | HR 79 | Temp 98.6°F | Ht 67.0 in | Wt 154.0 lb

## 2020-08-03 DIAGNOSIS — E782 Mixed hyperlipidemia: Secondary | ICD-10-CM | POA: Diagnosis not present

## 2020-08-03 DIAGNOSIS — E039 Hypothyroidism, unspecified: Secondary | ICD-10-CM

## 2020-08-03 DIAGNOSIS — E1151 Type 2 diabetes mellitus with diabetic peripheral angiopathy without gangrene: Secondary | ICD-10-CM

## 2020-08-03 DIAGNOSIS — I10 Essential (primary) hypertension: Secondary | ICD-10-CM | POA: Diagnosis not present

## 2020-08-03 LAB — POCT GLYCOSYLATED HEMOGLOBIN (HGB A1C): Hemoglobin A1C: 10.4 % — AB (ref 4.0–5.6)

## 2020-08-03 LAB — COMPREHENSIVE METABOLIC PANEL
ALT: 22 U/L (ref 0–35)
AST: 23 U/L (ref 0–37)
Albumin: 4.2 g/dL (ref 3.5–5.2)
Alkaline Phosphatase: 68 U/L (ref 39–117)
BUN: 27 mg/dL — ABNORMAL HIGH (ref 6–23)
CO2: 29 mEq/L (ref 19–32)
Calcium: 9.5 mg/dL (ref 8.4–10.5)
Chloride: 100 mEq/L (ref 96–112)
Creatinine, Ser: 1.21 mg/dL — ABNORMAL HIGH (ref 0.40–1.20)
GFR: 44.8 mL/min — ABNORMAL LOW (ref >60.00)
Glucose, Bld: 210 mg/dL — ABNORMAL HIGH (ref 70–99)
Potassium: 4.6 mEq/L (ref 3.5–5.1)
Sodium: 137 mEq/L (ref 135–145)
Total Bilirubin: 0.5 mg/dL (ref 0.2–1.2)
Total Protein: 6.3 g/dL (ref 6.0–8.3)

## 2020-08-03 LAB — LIPID PANEL
Cholesterol: 102 mg/dL (ref 0–200)
HDL: 38.7 mg/dL — ABNORMAL LOW (ref >39.00)
LDL Cholesterol: 29 mg/dL (ref 0–99)
NonHDL: 63.01
Total CHOL/HDL Ratio: 3
Triglycerides: 171 mg/dL — ABNORMAL HIGH (ref 0.0–149.0)
VLDL: 34.2 mg/dL (ref 0.0–40.0)

## 2020-08-03 LAB — TSH: TSH: 4.62 u[IU]/mL — ABNORMAL HIGH (ref 0.35–4.50)

## 2020-08-03 MED ORDER — NOVOLOG FLEXPEN 100 UNIT/ML ~~LOC~~ SOPN: PEN_INJECTOR | SUBCUTANEOUS | 3 refills | Status: DC

## 2020-08-03 NOTE — Patient Instructions (Addendum)
Stop by the lab prior to leaving today. I will notify you of your results once received.   Inject 30 units of your Lantus in the morning and 30 units of Lantus in the evening. We discussed this last time.   Inject Novolog 25 units before breakfast, lunch, and dinner if you eat.  Continue to check your blood sugars, call me if they remain at or above 150 consistently after 2 weeks.   It is important that you improve your diet. Please limit carbohydrates in the form of white bread, rice, pasta, sweets, fast food, fried food, sugary drinks, etc. Increase your consumption of fresh fruits and vegetables, whole grains, lean protein.  Ensure you are consuming 64 ounces of water daily.  Start exercising. You should be getting 150 minutes of moderate intensity exercise weekly.  Please schedule a follow up appointment in 3 months.  It was a pleasure to see you today!   Diabetes Mellitus and Nutrition, Adult When you have diabetes, or diabetes mellitus, it is very important to have healthy eating habits because your blood sugar (glucose) levels are greatly affected by what you eat and drink. Eating healthy foods in the right amounts, at about the same times every day, can help you:  Control your blood glucose.  Lower your risk of heart disease.  Improve your blood pressure.  Reach or maintain a healthy weight. What can affect my meal plan? Every person with diabetes is different, and each person has different needs for a meal plan. Your health care provider may recommend that you work with a dietitian to make a meal plan that is best for you. Your meal plan may vary depending on factors such as:  The calories you need.  The medicines you take.  Your weight.  Your blood glucose, blood pressure, and cholesterol levels.  Your activity level.  Other health conditions you have, such as heart or kidney disease. How do carbohydrates affect me? Carbohydrates, also called carbs, affect your  blood glucose level more than any other type of food. Eating carbs naturally raises the amount of glucose in your blood. Carb counting is a method for keeping track of how many carbs you eat. Counting carbs is important to keep your blood glucose at a healthy level, especially if you use insulin or take certain oral diabetes medicines. It is important to know how many carbs you can safely have in each meal. This is different for every person. Your dietitian can help you calculate how many carbs you should have at each meal and for each snack. How does alcohol affect me? Alcohol can cause a sudden decrease in blood glucose (hypoglycemia), especially if you use insulin or take certain oral diabetes medicines. Hypoglycemia can be a life-threatening condition. Symptoms of hypoglycemia, such as sleepiness, dizziness, and confusion, are similar to symptoms of having too much alcohol.  Do not drink alcohol if: ? Your health care provider tells you not to drink. ? You are pregnant, may be pregnant, or are planning to become pregnant.  If you drink alcohol: ? Do not drink on an empty stomach. ? Limit how much you use to:  0-1 drink a day for women.  0-2 drinks a day for men. ? Be aware of how much alcohol is in your drink. In the [place] one drink equals one 12 oz bottle of beer (355 mL), one 5 oz glass of wine (148 mL), or one 1 oz glass of hard liquor (44 mL). ? Keep yourself  hydrated with water, diet soda, or unsweetened iced tea.  Keep in mind that regular soda, juice, and other mixers may contain a lot of sugar and must be counted as carbs. What are tips for following this plan? Reading food labels  Start by checking the serving size on the "Nutrition Facts" label of packaged foods and drinks. The amount of calories, carbs, fats, and other nutrients listed on the label is based on one serving of the item. Many items contain more than one serving per package.  Check the total grams (g) of carbs in  one serving. You can calculate the number of servings of carbs in one serving by dividing the total carbs by 15. For example, if a food has 30 g of total carbs per serving, it would be equal to 2 servings of carbs.  Check the number of grams (g) of saturated fats and trans fats in one serving. Choose foods that have a low amount or none of these fats.  Check the number of milligrams (mg) of salt (sodium) in one serving. Most people should limit total sodium intake to less than 2,300 mg per day.  Always check the nutrition information of foods labeled as "low-fat" or "nonfat." These foods may be higher in added sugar or refined carbs and should be avoided.  Talk to your dietitian to identify your daily goals for nutrients listed on the label. Shopping  Avoid buying canned, pre-made, or processed foods. These foods tend to be high in fat, sodium, and added sugar.  Shop around the outside edge of the grocery store. This is where you will most often find fresh fruits and vegetables, bulk grains, fresh meats, and fresh dairy. Cooking  Use low-heat cooking methods, such as baking, instead of high-heat cooking methods like deep frying.  Cook using healthy oils, such as olive, canola, or sunflower oil.  Avoid cooking with butter, cream, or high-fat meats. Meal planning  Eat meals and snacks regularly, preferably at the same times every day. Avoid going long periods of time without eating.  Eat foods that are high in fiber, such as fresh fruits, vegetables, beans, and whole grains. Talk with your dietitian about how many servings of carbs you can eat at each meal.  Eat 4-6 oz (112-168 g) of lean protein each day, such as lean meat, chicken, fish, eggs, or tofu. One ounce (oz) of lean protein is equal to: ? 1 oz (28 g) of meat, chicken, or fish. ? 1 egg. ?  cup (62 g) of tofu.  Eat some foods each day that contain healthy fats, such as avocado, nuts, seeds, and fish.   What foods should I  eat? Fruits Berries. Apples. Oranges. Peaches. Apricots. Plums. Grapes. Mango. Papaya. Pomegranate. Kiwi. Cherries. Vegetables Lettuce. Spinach. Leafy greens, including kale, chard, collard greens, and mustard greens. Beets. Cauliflower. Cabbage. Broccoli. Carrots. Green beans. Tomatoes. Peppers. Onions. Cucumbers. Brussels sprouts. Grains Whole grains, such as whole-wheat or whole-grain bread, crackers, tortillas, cereal, and pasta. Unsweetened oatmeal. Quinoa. [place] or wild rice. Meats and other proteins Seafood. Poultry without skin. Lean cuts of poultry and beef. Tofu. Nuts. Seeds. Dairy Low-fat or fat-free dairy products such as milk, yogurt, and cheese. The items listed above may not be a complete list of foods and beverages you can eat. Contact a dietitian for more information. What foods should I avoid? Fruits Fruits canned with syrup. Vegetables Canned vegetables. Frozen vegetables with butter or cream sauce. Grains Refined white flour and flour products such as  bread, pasta, snack foods, and cereals. Avoid all processed foods. Meats and other proteins Fatty cuts of meat. Poultry with skin. Breaded or fried meats. Processed meat. Avoid saturated fats. Dairy Full-fat yogurt, cheese, or milk. Beverages Sweetened drinks, such as soda or iced tea. The items listed above may not be a complete list of foods and beverages you should avoid. Contact a dietitian for more information. Questions to ask a health care provider  Do I need to meet with a diabetes educator?  Do I need to meet with a dietitian?  What number can I call if I have questions?  When are the best times to check my blood glucose? Where to find more information:  American Diabetes Association: diabetes.org  Academy of Nutrition and Dietetics: www.eatright.[employer] of Diabetes and Digestive and Kidney Diseases: [employer].dk  Association of Diabetes Care and Education Specialists:  www.diabeteseducator.org Summary  It is important to have healthy eating habits because your blood sugar (glucose) levels are greatly affected by what you eat and drink.  A healthy meal plan will help you control your blood glucose and maintain a healthy lifestyle.  Your health care provider may recommend that you work with a dietitian to make a meal plan that is best for you.  Keep in mind that carbohydrates (carbs) and alcohol have immediate effects on your blood glucose levels. It is important to count carbs and to use alcohol carefully. This information is not intended to replace advice given to you by your health care provider. Make sure you discuss any questions you have with your health care provider. Document Revised: 06/14/2019 Document Reviewed: 06/14/2019 Elsevier Patient Education  2021 [employer].

## 2020-08-03 NOTE — Progress Notes (Signed)
Subjective:    Patient ID: Sarah Payne, female    DOB: October 05, 1945, 75 y.o.   MRN: [id number]  HPI  This visit occurred during the SARS-CoV-2 public health emergency.  Safety protocols were in place, including screening questions prior to the visit, additional usage of staff PPE, and extensive cleaning of exam room while observing appropriate contact time as indicated for disinfecting solutions.   Sarah Payne is a 75 year old female with a significant medical history including CAD, uncontrolled diabetes, COPD, GERD, hypothyroidism, chronic pain who presents today for follow up of diabetes and hypothyroidism.  1) Type 2 Diabetes:   Current medications include: Lantus 30 units BID, Novolog 20 units before lunch and 25 units before dinner. She is injecting   She decided to change her regimen to: she is injecting 22 units of Novolog before breakfast, 22 units before lunch, 22 units before dinner. She is injecting Lantus 50 units at bedtime.   She is checking her blood glucose several times daily and is getting readings ranging: 120-200 now, 200-300's one month ago.   She did not bring her glucose sheet. She doesn't really remember what her readings are 2 hours after meals, thinks they are about 150-160. She is now eating breakfast, previously was not. She believes that her thyroid function was causing her blood sugars to be so high.   Last A1C:  10.3 in October 2021, 10.4 today Last Eye Exam: Due Last Foot Exam: UTD Pneumonia Vaccination: UTD ACE/ARB: Enalapril  Statin: Crestor   She is Pepsi Zero, she will drink one pepsi if her sugar is "ok" (around 120).   2) Hypothyroidism: Currently managed on levothyroxine 88 mcg. She is taking her levothyroxine every morning on an empty stomach with water only. She is not taking any medication for 30 minutes. She does not take heartburn medication, calcium, vitamins within 4 hours.   She is feeling better, thinks her 88 mcg dose is better. She is  due for repeat TSH today.  Review of Systems  Eyes: Negative for visual disturbance.  Respiratory: Negative for shortness of breath.   Cardiovascular: Negative for chest pain.  Neurological: Negative for dizziness and headaches.       Past Medical History:  Diagnosis Date  . Acute systolic (congestive) heart failure ([place]) 01/01/2016  . Cataract    BILATERAL REMOVED  . Chronic low back pain   . COPD (chronic obstructive pulmonary disease) ([place])   . Coronary artery disease    a. multiple PCIs to the mLCx. b. stent to dRCA 08/2011 in setting of NSTEMI. c. DES to prox-mid LCx for ISR 09/2011. d.  DESx2 to prox-mRCA 07/2012. e. Takotsubo event 12/2015 with patent stents.  . Depression   . Diabetes mellitus   . Diverticulosis   . Frequent PVCs    a. Noted in hospital 12/2015.  Marland Kitchen GERD (gastroesophageal reflux disease)   . Hyperlipidemia   . Hypertension   . Hypothyroidism   . Impingement syndrome of left shoulder   . Marijuana abuse   . Myocardial infarction ([place])    x 5  . Sleep apnea    mild-does not use cpap  . Takotsubo cardiomyopathy    a. 12/2015 - nephew committed suicide 1 week prior, sister died the morning of presentation - initially called a STEMI; cath with patent stents. LVEF 25-30%.  . Tendonitis of left rotator cuff   . Tobacco abuse   . Vascular dementia      Social History  Socioeconomic History  . Marital status: Married    Spouse name: Not on file  . Number of children: Not on file  . Years of education: Not on file  . Highest education level: Not on file  Occupational History  . Not on file  Tobacco Use  . Smoking status: Current Every Day Smoker    Packs/day: 1.00    Years: 45.00    Pack years: 45.00    Types: Cigarettes  . Smokeless tobacco: Never Used  . Tobacco comment: Has cut back, trying to quit.   Vaping Use  . Vaping Use: Never used  Substance and Sexual Activity  . Alcohol use: No    Alcohol/week: 0.0 standard drinks  . Drug use: Yes     Types: Marijuana    Comment: last noc  . Sexual activity: Not on file  Other Topics Concern  . Not on file  Social History Narrative   Lives at home with her husband in [place].  Previously used marijuana - quit.      Regular exercise: no/ pain from a frozen rotator cuff   Caffeine use: coffee daily and pepsi      Does not have a living will.   Daughters and husband know her wishes- would desire CPR but not prolonged life support if futile   Social Determinants of Health   Financial Resource Strain: Not on file  Food Insecurity: Not on file  Transportation Needs: Not on file  Physical Activity: Not on file  Stress: Not on file  Social Connections: Not on file  Intimate Partner Violence: Not on file    Past Surgical History:  Procedure Laterality Date  . ABDOMINAL HYSTERECTOMY    . APPENDECTOMY    . BACK SURGERY    . CARDIAC CATHETERIZATION  2013  . CARDIAC CATHETERIZATION  2016  . CARDIAC CATHETERIZATION N/A 01/01/2016   Procedure: Left Heart Cath and Coronary Angiography;  Surgeon: Jettie Booze, MD;  Location: [facility];  Service: Cardiovascular;  Laterality: N/A;  . CATARACT EXTRACTION W/PHACO Left 01/30/2015   Procedure: CATARACT EXTRACTION PHACO AND INTRAOCULAR LENS PLACEMENT (IOC);  Surgeon: Birder Robson, MD;  Location: [facility];  Service: Ophthalmology;  Laterality: Left;  [place] 00:47   . CATARACT EXTRACTION W/PHACO Right 02/13/2015   Procedure: CATARACT EXTRACTION PHACO AND INTRAOCULAR LENS PLACEMENT (IOC);  Surgeon: Birder Robson, MD;  Location: [facility];  Service: Ophthalmology;  Laterality: Right;  cassette lot # [id number] H [place]  00:29.9 AP  20.7 CDE  6.20  . COLONOSCOPY N/A 11/02/2014   Procedure: COLONOSCOPY;  Surgeon: Inda Castle, MD;  Location: [facility];  Service: Endoscopy;  Laterality: N/A;  . CORONARY ANGIOPLASTY  2012   stent x 3   . CORONARY ANGIOPLASTY WITH STENT PLACEMENT  2013  . ESOPHAGOGASTRODUODENOSCOPY (EGD) WITH PROPOFOL  N/A 09/21/2018   Procedure: ESOPHAGOGASTRODUODENOSCOPY (EGD) WITH PROPOFOL;  Surgeon: [name], MD;  Location: [facility];  Service: Gastroenterology;  Laterality: N/A;  . SHOULDER SURGERY Left 2017    Family History  Problem Relation Age of Onset  . Heart attack Mother        First MI @ 72 - Died @ 26  . Heart disease Mother   . Heart disease Father        Died @ 1  . Throat cancer Brother   . Liver cancer Brother   . Colon cancer Sister     Allergies  Allergen Reactions  . No Known Allergies  Current Outpatient Medications on File Prior to Visit  Medication Sig Dispense Refill  . albuterol (VENTOLIN HFA) 108 (90 Base) MCG/ACT inhaler Inhale 2 puffs into the lungs every 6 (six) hours as needed for wheezing or shortness of breath. 8 g 0  . amitriptyline (ELAVIL) 50 MG tablet Take 1 tablet (50 mg total) by mouth at bedtime. 30 tablet 5  . carvedilol (COREG) 6.25 MG tablet Take 1 tablet (6.25 mg total) by mouth 2 (two) times daily. 180 tablet 3  . clobetasol ointment (TEMOVATE) 0.05 % APPLY EVERY NIGHT TO VAGINAL FOLDS 30 g 0  . clopidogrel (PLAVIX) 75 MG tablet Take 1 tablet (75 mg total) by mouth daily. 90 tablet 3  . enalapril (VASOTEC) 20 MG tablet Take 1 tablet by mouth once daily for blood pressure. 90 tablet 3  . ezetimibe (ZETIA) 10 MG tablet Take 1 tablet (10 mg total) by mouth daily. For cholesterol. 90 tablet 3  . furosemide (LASIX) 20 MG tablet Take 1 tablet (20 mg total) by mouth daily as needed for edema. 30 tablet 2  . gabapentin (NEURONTIN) 300 MG capsule Take 1 capsule (300 mg total) by mouth 3 (three) times daily. 90 capsule 5  . insulin aspart (NOVOLOG FLEXPEN) 100 UNIT/ML FlexPen Inject 20 units before lunch, and inject 25 units before dinner. 15 mL 0  . insulin glargine (LANTUS SOLOSTAR) 100 UNIT/ML Solostar Pen Inject 30 Units into the skin 2 (two) times daily. 30 mL 3  . Insulin Pen Needle (INSUPEN PEN NEEDLES) 32G X 4 MM MISC Use to inject insulin 4  times daily. 250 each 1  . isosorbide mononitrate (IMDUR) 30 MG 24 hr tablet Take 1 tablet (30 mg total) by mouth daily. 90 tablet 3  . levothyroxine (SYNTHROID) 88 MCG tablet Take 1 tablet by mouth every morning on an empty stomach with water only.  No food or other medications for 30 minutes. 90 tablet 0  . meloxicam (MOBIC) 15 MG tablet Take 1 tablet (15 mg total) by mouth daily. 30 tablet 5  . neomycin-polymyxin b-dexamethasone (MAXITROL) 3.5-10000-0.1 SUSP Place 1 drop into the left eye 3 (three) times daily.    . rosuvastatin (CRESTOR) 40 MG tablet Take 1 tablet (40 mg total) by mouth every evening. For cholesterol. 90 tablet 3  . traZODone (DESYREL) 100 MG tablet TAKE 1 TABLET BY MOUTH AT BEDTIME AS NEEDED FOR SLEEP 90 tablet 1  . varenicline (CHANTIX) 1 MG tablet Take 1 tablet (1 mg total) by mouth 2 (two) times daily. 60 tablet 3   No current facility-administered medications on file prior to visit.    BP (!) 164/88 Comment: has not had meds  Pulse 79   Temp 98.6 F (37 C) (Temporal)   Ht 5\' 7"  (1.702 m)   Wt 154 lb (69.9 kg)   SpO2 97%   BMI 24.12 kg/m    Objective:   Physical Exam Constitutional:      Appearance: She is well-nourished.  Cardiovascular:     Rate and Rhythm: Normal rate and regular rhythm.  Pulmonary:     Effort: Pulmonary effort is normal.     Breath sounds: Normal breath sounds.  Musculoskeletal:     Cervical back: Neck supple.  Skin:    General: Skin is warm and dry.  Psychiatric:        Mood and Affect: Mood and affect normal.            Assessment & Plan:

## 2020-08-03 NOTE — Assessment & Plan Note (Signed)
She is taking levothyroxine correctly, continue 88 mcg for now. Repeat TSH pending.

## 2020-08-03 NOTE — Assessment & Plan Note (Signed)
Uncontrolled with A1C of 10.4 today.  She did not follow instructions regarding her Lantus dose or Novolog dose. Long discussion about the need to follow instructions. I went over her instructions today numerous times, typed them up on her AVS, highlighted these medications on her AVS.  Start Lantus 30 units BID. Increase Novolog to 25 units three times daily.   She will check glucose and notify me in a few weeks if readings remain above 150 consistently.  Follow up in 3 months.

## 2020-08-07 ENCOUNTER — Telehealth: Payer: Self-pay | Admitting: *Deleted

## 2020-08-07 ENCOUNTER — Other Ambulatory Visit: Payer: Self-pay

## 2020-08-07 DIAGNOSIS — E1151 Type 2 diabetes mellitus with diabetic peripheral angiopathy without gangrene: Secondary | ICD-10-CM

## 2020-08-07 MED ORDER — LANTUS SOLOSTAR 100 UNIT/ML ~~LOC~~ SOPN: 30.0000 [IU] | PEN_INJECTOR | Freq: Two times a day (BID) | SUBCUTANEOUS | 3 refills | Status: DC

## 2020-08-07 NOTE — Telephone Encounter (Signed)
Called patient to give labs results and discussed issues with patient. We have called in meds as she has requested.

## 2020-08-07 NOTE — Telephone Encounter (Signed)
Patient left a voicemail stating that she has not gotten her Lantus yet. Patient stated that she is completely out and needs a call back about getting it filled.

## 2020-08-15 ENCOUNTER — Telehealth: Payer: Self-pay

## 2020-08-15 NOTE — Telephone Encounter (Signed)
Pt left v/m requesting refill for free style light test strips.

## 2020-08-16 ENCOUNTER — Other Ambulatory Visit: Payer: Self-pay

## 2020-08-16 MED ORDER — FREESTYLE LITE TEST VI STRP: ORAL_STRIP | 12 refills | Status: DC

## 2020-08-16 NOTE — Telephone Encounter (Signed)
Refill sent in to mail order l/m to let patient know.

## 2020-08-21 ENCOUNTER — Telehealth: Payer: Self-pay

## 2020-08-21 NOTE — Chronic Care Management (AMB) (Signed)
Chronic Care Management Pharmacy Assistant   Name: Sarah Payne  MRN: [id number] DOB: Nov 21, 1945  Reason for Encounter: Schedule CCM appointment  Patient Question:  1.  Have you seen any other providers since your last visit? Yes  08/03/20- Alma Friendly, NP- PCP. Increased Novolog flex pen from 20 units before lunch and 25 before dinner to 25 units before lunch and after dinner.   07/04/20- Dr. Ida Rogue- Cardiology- Restarted patient on Chantix, increased furosemide from every other day to daily.    06/19/20- Dr. Gillis Santa- Pain management- Started patient on amitriptyline 50 mg and meloxicam 15 mg.    ORDERS ONLY: 11/15/21Alma Friendly, NP decreased levothyroxine from 88 mcg to 75 mcg   05/04/20- Laurann Montana, NP- Cardiology. Started patient on furosemide 20 mg.    05/03/20- Alma Friendly, NP- Increased Lantus Solostar from 25 units twice daily to 30 units twice a day.  04/05/20- Laurann Montana, NP- Cardiology  ORDERS ONLY: 04/03/20- Alma Friendly, NP increased synthroid from 75 mcg to 100 mcg  04/02/20- Alma Friendly, NP   02/27/20- Dr. Ida Rogue- Cardiology- Started patient on  Ventolin 108 (90 base)    PCP : [name], NP  Allergies:   Allergies  Allergen Reactions  . No Known Allergies     Medications: Outpatient Encounter Medications as of 08/21/2020  Medication Sig  . albuterol (VENTOLIN HFA) 108 (90 Base) MCG/ACT inhaler Inhale 2 puffs into the lungs every 6 (six) hours as needed for wheezing or shortness of breath.  Marland Kitchen amitriptyline (ELAVIL) 50 MG tablet Take 1 tablet (50 mg total) by mouth at bedtime.  . carvedilol (COREG) 6.25 MG tablet Take 1 tablet (6.25 mg total) by mouth 2 (two) times daily.  . clobetasol ointment (TEMOVATE) 0.05 % APPLY EVERY NIGHT TO VAGINAL FOLDS  . clopidogrel (PLAVIX) 75 MG tablet Take 1 tablet (75 mg total) by mouth daily.  . enalapril (VASOTEC) 20 MG tablet Take 1 tablet by mouth once daily  for blood pressure.  . ezetimibe (ZETIA) 10 MG tablet Take 1 tablet (10 mg total) by mouth daily. For cholesterol.  . furosemide (LASIX) 20 MG tablet Take 1 tablet (20 mg total) by mouth daily as needed for edema.  . gabapentin (NEURONTIN) 300 MG capsule Take 1 capsule (300 mg total) by mouth 3 (three) times daily.  Marland Kitchen glucose blood (FREESTYLE LITE) test strip Test up to four times a day as needed. DX  . insulin aspart (NOVOLOG FLEXPEN) 100 UNIT/ML FlexPen Inject 25 units before breakfast, 25 units before lunch, and inject 25 units before dinner.  . insulin glargine (LANTUS SOLOSTAR) 100 UNIT/ML Solostar Pen Inject 30 Units into the skin 2 (two) times daily.  . Insulin Pen Needle (INSUPEN PEN NEEDLES) 32G X 4 MM MISC Use to inject insulin 4 times daily.  . isosorbide mononitrate (IMDUR) 30 MG 24 hr tablet Take 1 tablet (30 mg total) by mouth daily.  Marland Kitchen levothyroxine (SYNTHROID) 88 MCG tablet Take 1 tablet by mouth every morning on an empty stomach with water only.  No food or other medications for 30 minutes.  . meloxicam (MOBIC) 15 MG tablet Take 1 tablet (15 mg total) by mouth daily.  Marland Kitchen neomycin-polymyxin b-dexamethasone (MAXITROL) 3.5-10000-0.1 SUSP Place 1 drop into the left eye 3 (three) times daily.  . rosuvastatin (CRESTOR) 40 MG tablet Take 1 tablet (40 mg total) by mouth every evening. For cholesterol.  . traZODone (DESYREL) 100 MG tablet TAKE 1 TABLET BY MOUTH AT  BEDTIME AS NEEDED FOR SLEEP  . varenicline (CHANTIX) 1 MG tablet Take 1 tablet (1 mg total) by mouth 2 (two) times daily.   No facility-administered encounter medications on file as of 08/21/2020.    Current Diagnosis: Patient Active Problem List   Diagnosis Date Noted  . Abdominal bloating 03/22/2019  . Vaginal burning 03/22/2019  . Foul smelling urine 03/22/2019  . Acute pain of left shoulder 01/17/2019  . Chronic musculoskeletal pain 11/15/2018  . Atherosclerosis of native coronary artery of native heart with stable  angina pectoris ([place]) 05/17/2018  . Chronic neck pain 02/10/2018  . Chronic pain of right upper extremity 02/10/2018  . Cervical spondylosis 02/10/2018  . Neck pain 01/19/2018  . Right shoulder pain 01/19/2018  . Chronic pain syndrome 01/19/2018  . Lumbar degenerative disc disease 09/29/2017  . Lumbar spondylosis with myelopathy 09/29/2017  . Chronic bilateral back pain 05/19/2017  . Insomnia 01/12/2017  . Cannabis use disorder, mild, abuse 09/03/2016  . Cognitive changes 09/03/2016  . Takotsubo cardiomyopathy 01/02/2016  . Frequent PVCs 01/02/2016  . HTN (hypertension) 09/20/2015  . Benign neoplasm of colon 11/02/2014  . Internal hemorrhoids 11/02/2014  . Adjustment disorder with mixed anxiety and depressed mood 08/16/2014  . Esophageal reflux 05/23/2014  . Diverticulosis of colon without hemorrhage 05/17/2014  . Peripheral polyneuropathy 05/05/2014  . Tobacco dependence 05/05/2014  . COPD, moderate ([place]) 03/29/2013  . Noncompliance with diabetes treatment 02/27/2013  . Obstructive sleep apnea 02/25/2013  . Chronic systolic congestive heart failure ([place]) 05/21/2012  . Lichen sclerosus et atrophicus 05/20/2012  . Bronchitis, chronic obstructive ([place]) 05/20/2012  . Diabetes mellitus type 2 with peripheral artery disease ([place]) 08/28/2011  . History of lumbar fusion 06/29/2011  . Depression 06/24/2011  . Hypothyroidism 05/03/2011  . CAD, multiple vessel 02/24/2011  . Tobacco abuse 02/24/2011  . Mixed hyperlipidemia 02/24/2011  . Postural dizziness with near syncope 02/24/2011    Per [name], Pharm. D, [name]  was contacted to schedule CCM follow up appointment. Patient's last visit with Sharyn Lull was 10/28/19. Follow up appointment was scheduled for 3/9/2 at 3:00.   . Are you having any problems with your medications? Not at this time  . Any concerns patient would like to discuss with the pharmacist? Patient denies any concerns.  Patient reminded to have all  medications, supplements and any blood glucose and blood pressure readings available for review with [name], Pharm. D, at their telephone visit on 09/26/20.  Follow-Up:  Pharmacist Review and Scheduled Follow-Up With Clinical Pharmacist  [name], CPP notified  Margaretmary Dys, [place] [phone]  Total time spent for month: 21:32

## 2020-08-22 ENCOUNTER — Other Ambulatory Visit: Payer: Self-pay | Admitting: Cardiovascular Disease

## 2020-08-22 DIAGNOSIS — R209 Unspecified disturbances of skin sensation: Secondary | ICD-10-CM

## 2020-08-22 DIAGNOSIS — R29898 Other symptoms and signs involving the musculoskeletal system: Secondary | ICD-10-CM

## 2020-08-23 ENCOUNTER — Other Ambulatory Visit: Payer: Self-pay

## 2020-08-23 ENCOUNTER — Ambulatory Visit (INDEPENDENT_AMBULATORY_CARE_PROVIDER_SITE_OTHER): Payer: Medicare PPO

## 2020-08-23 ENCOUNTER — Other Ambulatory Visit: Payer: Self-pay | Admitting: Primary Care

## 2020-08-23 DIAGNOSIS — R209 Unspecified disturbances of skin sensation: Secondary | ICD-10-CM

## 2020-08-23 DIAGNOSIS — E039 Hypothyroidism, unspecified: Secondary | ICD-10-CM

## 2020-08-23 DIAGNOSIS — R29898 Other symptoms and signs involving the musculoskeletal system: Secondary | ICD-10-CM

## 2020-08-27 ENCOUNTER — Telehealth: Payer: Self-pay

## 2020-08-27 NOTE — Telephone Encounter (Signed)
Able to reach pt regarding her recent dopplar [place] , Dr. Rockey Situ had a chance to review her results and advised   "Lower extremity arterial study  No significant stenosis, no significant aneurysm  Leg arterial study:  Left: 75-99% stenosis noted in the superficial femoral artery. Total occlusion of the mid-distal SFA could not be ruled-out due to calcified plaque"  Pt already schedule with Dr. Fletcher Anon for 2/10 at 08:30am for review of her recent arterial study, pt states she is planning to keep appt d/t severity of her recent results, then further plan of care will be discuss. Pt agreeable wit plan, otherwise all questions or concerns were address and no additional concerns at this time.

## 2020-08-29 NOTE — Progress Notes (Signed)
Cardiology Office Note   Date:  08/30/2020   ID:  Sarah, Payne 1945-09-15, MRN [id number]  PCP:  Sarah Koch, NP  Cardiologist:  Dr. Rockey Situ  Chief Complaint  Patient presents with  . Follow-up    Review results of LEA/ABI      History of Present Illness: Sarah Payne is a 75 y.o. female who was referred by Dr. Rockey Situ for evaluation and management of peripheral arterial disease. She has known history of coronary artery disease status post multiple PCIs, chronic systolic heart failure, tobacco use, poorly controlled diabetes, essential hypertension and hyperlipidemia.  She continues to smoke 1 pack/day. She reports severe left calf claudication that started about 2 months ago and has been getting worse.  It happens after walking less than a block.  Frequently after she walks, her left calf starts throbbing with significant numbness.  She also has discomfort in the left calf and foot at night when she is resting.  No lower extremity ulceration.  She underwent recent noninvasive vascular studies which showed an ABI of 1.08 on the right and 0.63 on the left with absent toe pressure. Duplex showed no significant aortoiliac disease. There was evidence of subtotal occlusion in the distal left SFA.  She denies chest pain or worsening dyspnea.  Past Medical History:  Diagnosis Date  . Acute systolic (congestive) heart failure ([place]) 01/01/2016  . Cataract    BILATERAL REMOVED  . Chronic low back pain   . COPD (chronic obstructive pulmonary disease) ([place])   . Coronary artery disease    a. multiple PCIs to the mLCx. b. stent to dRCA 08/2011 in setting of NSTEMI. c. DES to prox-mid LCx for ISR 09/2011. d.  DESx2 to prox-mRCA 07/2012. e. Takotsubo event 12/2015 with patent stents.  . Depression   . Diabetes mellitus   . Diverticulosis   . Frequent PVCs    a. Noted in hospital 12/2015.  Marland Kitchen GERD (gastroesophageal reflux disease)   . Hyperlipidemia   . Hypertension   .  Hypothyroidism   . Impingement syndrome of left shoulder   . Marijuana abuse   . Myocardial infarction ([place])    x 5  . Sleep apnea    mild-does not use cpap  . Takotsubo cardiomyopathy    a. 12/2015 - nephew committed suicide 1 week prior, sister died the morning of presentation - initially called a STEMI; cath with patent stents. LVEF 25-30%.  . Tendonitis of left rotator cuff   . Tobacco abuse   . Vascular dementia     Past Surgical History:  Procedure Laterality Date  . ABDOMINAL HYSTERECTOMY    . APPENDECTOMY    . BACK SURGERY    . CARDIAC CATHETERIZATION  2013  . CARDIAC CATHETERIZATION  2016  . CARDIAC CATHETERIZATION N/A 01/01/2016   Procedure: Left Heart Cath and Coronary Angiography;  Surgeon: Jettie Booze, MD;  Location: [facility];  Service: Cardiovascular;  Laterality: N/A;  . CATARACT EXTRACTION W/PHACO Left 01/30/2015   Procedure: CATARACT EXTRACTION PHACO AND INTRAOCULAR LENS PLACEMENT (IOC);  Surgeon: Birder Robson, MD;  Location: [facility];  Service: Ophthalmology;  Laterality: Left;  [place] 00:47   . CATARACT EXTRACTION W/PHACO Right 02/13/2015   Procedure: CATARACT EXTRACTION PHACO AND INTRAOCULAR LENS PLACEMENT (IOC);  Surgeon: Birder Robson, MD;  Location: [facility];  Service: Ophthalmology;  Laterality: Right;  cassette lot # [id number] H [place]  00:29.9 AP  20.7 CDE  6.20  . COLONOSCOPY N/A 11/02/2014  Procedure: COLONOSCOPY;  Surgeon: Inda Castle, MD;  Location: [facility];  Service: Endoscopy;  Laterality: N/A;  . CORONARY ANGIOPLASTY  2012   stent x 3   . CORONARY ANGIOPLASTY WITH STENT PLACEMENT  2013  . ESOPHAGOGASTRODUODENOSCOPY (EGD) WITH PROPOFOL N/A 09/21/2018   Procedure: ESOPHAGOGASTRODUODENOSCOPY (EGD) WITH PROPOFOL;  Surgeon: [name], MD;  Location: [facility];  Service: Gastroenterology;  Laterality: N/A;  . SHOULDER SURGERY Left 2017     Current Outpatient Medications  Medication Sig Dispense Refill  . albuterol (VENTOLIN  HFA) 108 (90 Base) MCG/ACT inhaler Inhale 2 puffs into the lungs every 6 (six) hours as needed for wheezing or shortness of breath. 8 g 0  . amitriptyline (ELAVIL) 50 MG tablet Take 1 tablet (50 mg total) by mouth at bedtime. 30 tablet 5  . carvedilol (COREG) 6.25 MG tablet Take 1 tablet (6.25 mg total) by mouth 2 (two) times daily. 180 tablet 3  . clobetasol ointment (TEMOVATE) 0.05 % APPLY EVERY NIGHT TO VAGINAL FOLDS 30 g 0  . clopidogrel (PLAVIX) 75 MG tablet Take 1 tablet (75 mg total) by mouth daily. 90 tablet 3  . enalapril (VASOTEC) 20 MG tablet Take 1 tablet by mouth once daily for blood pressure. 90 tablet 3  . ezetimibe (ZETIA) 10 MG tablet Take 1 tablet (10 mg total) by mouth daily. For cholesterol. 90 tablet 3  . furosemide (LASIX) 20 MG tablet Take 1 tablet (20 mg total) by mouth daily as needed for edema. 30 tablet 2  . gabapentin (NEURONTIN) 300 MG capsule Take 1 capsule (300 mg total) by mouth 3 (three) times daily. 90 capsule 5  . glucose blood (FREESTYLE LITE) test strip Test up to four times a day as needed. DX 100 each 12  . insulin aspart (NOVOLOG FLEXPEN) 100 UNIT/ML FlexPen Inject 25 units before breakfast, 25 units before lunch, and inject 25 units before dinner. 30 mL 3  . insulin glargine (LANTUS SOLOSTAR) 100 UNIT/ML Solostar Pen Inject 30 Units into the skin 2 (two) times daily. 30 mL 3  . Insulin Pen Needle (INSUPEN PEN NEEDLES) 32G X 4 MM MISC Use to inject insulin 4 times daily. 250 each 1  . isosorbide mononitrate (IMDUR) 30 MG 24 hr tablet Take 1 tablet (30 mg total) by mouth daily. 90 tablet 3  . levothyroxine (SYNTHROID) 88 MCG tablet Take 1 tablet by mouth every morning on an empty stomach with water only.  No food or other medications for 30 minutes. 90 tablet 0  . meloxicam (MOBIC) 15 MG tablet Take 1 tablet (15 mg total) by mouth daily. 30 tablet 5  . neomycin-polymyxin b-dexamethasone (MAXITROL) 3.5-10000-0.1 SUSP Place 1 drop into the left eye 3 (three) times  daily.    . rosuvastatin (CRESTOR) 40 MG tablet Take 1 tablet (40 mg total) by mouth every evening. For cholesterol. 90 tablet 3  . traZODone (DESYREL) 100 MG tablet TAKE 1 TABLET BY MOUTH AT BEDTIME AS NEEDED FOR SLEEP 90 tablet 1  . varenicline (CHANTIX) 1 MG tablet Take 1 tablet (1 mg total) by mouth 2 (two) times daily. (Patient not taking: Reported on 08/30/2020) 60 tablet 3   No current facility-administered medications for this visit.    Allergies:   No known allergies    Social History:  The patient  reports that she has been smoking cigarettes. She has a 45.00 pack-year smoking history. She has never used smokeless tobacco. She reports current drug use. Drug: Marijuana. She reports that she  does not drink alcohol.   Family History:  The patient's family history includes Colon cancer in her sister; Heart attack in her mother; Heart disease in her father and mother; Liver cancer in her brother; Throat cancer in her brother.    ROS:  Please see the history of present illness.   Otherwise, review of systems are positive for none.   All other systems are reviewed and negative.    PHYSICAL EXAM: VS:  BP (!) 142/92   Pulse 69   Ht 5\' 7"  (1.702 m)   Wt 159 lb (72.1 kg)   BMI 24.90 kg/m  , BMI Body mass index is 24.9 kg/m. GEN: Well nourished, well developed, in no acute distress  HEENT: normal  Neck: no JVD, carotid bruits, or masses Cardiac: RRR; no murmurs, rubs, or gallops,no edema  Respiratory:  clear to auscultation bilaterally, normal work of breathing GI: soft, nontender, nondistended, + BS MS: no deformity or atrophy  Skin: warm and dry, no rash Neuro:  Strength and sensation are intact Psych: euthymic mood, full affect Vascular: Femoral pulses normal bilaterally.  Distal pulses are palpable on the right side but not the left side   EKG:  EKG is ordered today. The ekg ordered today demonstrates sinus rhythm with first-degree AV block and possible old septal  infarct.   Recent Labs: 04/02/2020: Hemoglobin 13.7; Platelets 246.0 08/03/2020: ALT 22; BUN 27; Creatinine, Ser 1.21; Potassium 4.6; Sodium 137; TSH 4.62    Lipid Panel    Component Value Date/Time   CHOL 102 08/03/2020 1005   CHOL 200 (H) 08/29/2019 0947   CHOL 292 (H) 08/02/2014 0408   TRIG 171.0 (H) 08/03/2020 1005   TRIG 388 (H) 08/02/2014 0408   HDL 38.70 (L) 08/03/2020 1005   HDL 34 (L) 08/29/2019 0947   HDL 34 (L) 08/02/2014 0408   CHOLHDL 3 08/03/2020 1005   VLDL 34.2 08/03/2020 1005   VLDL 78 (H) 08/02/2014 0408   LDLCALC 29 08/03/2020 1005   LDLCALC 131 (H) 08/29/2019 0947   LDLCALC 180 (H) 08/02/2014 0408   LDLDIRECT 143.0 12/24/2018 1227      Wt Readings from Last 3 Encounters:  08/30/20 159 lb (72.1 kg)  08/03/20 154 lb (69.9 kg)  07/04/20 154 lb (69.9 kg)        No flowsheet data found.    ASSESSMENT AND PLAN:  1. Peripheral arterial disease: Severe left calf claudication with some rest pain at night.  No lower extremity ulceration.  He has subtotal occlusion of the left distal SFA which is the likely culprit.  Given severity of her symptoms, I recommend proceeding with abdominal aortogram with left lower extremity angiography and possible endovascular intervention.  I discussed the procedure in details as well as risk and benefits. Planned access is via the right common femoral artery.  Plan 4 hours of hydration given mild chronic kidney disease. She is already on dual antiplatelet therapy with aspirin and clopidogrel  2. Coronary artery disease involving native coronary arteries: No anginal symptoms at the present time.  3. Hyperlipidemia: Continue treatment with rosuvastatin and Zetia.  Most recent lipid profile showed an LDL of 29.  4. Chronic systolic heart failure: She appears to be euvolemic  5. Essential hypertension: Blood pressure is mildly elevated but she has not taken her medications.  6. Tobacco use: Smoking cessation is  advised    Disposition:   FU with me in 1 month  Signed,  Kathlyn Sacramento, MD  08/30/2020 9:07 AM  [facility]

## 2020-08-29 NOTE — H&P (View-Only) (Signed)
Cardiology Office Note   Date:  08/30/2020   ID:  Sarah, Payne Oct 27, 1945, MRN [id number]  PCP:  [name], NP  Cardiologist:  Dr. Rockey Situ  Chief Complaint  Patient presents with  . Follow-up    Review results of LEA/ABI      History of Present Illness: Sarah Payne is a 75 y.o. female who was referred by Dr. Rockey Situ for evaluation and management of peripheral arterial disease. She has known history of coronary artery disease status post multiple PCIs, chronic systolic heart failure, tobacco use, poorly controlled diabetes, essential hypertension and hyperlipidemia.  She continues to smoke 1 pack/day. She reports severe left calf claudication that started about 2 months ago and has been getting worse.  It happens after walking less than a block.  Frequently after she walks, her left calf starts throbbing with significant numbness.  She also has discomfort in the left calf and foot at night when she is resting.  No lower extremity ulceration.  She underwent recent noninvasive vascular studies which showed an ABI of 1.08 on the right and 0.63 on the left with absent toe pressure. Duplex showed no significant aortoiliac disease. There was evidence of subtotal occlusion in the distal left SFA.  She denies chest pain or worsening dyspnea.  Past Medical History:  Diagnosis Date  . Acute systolic (congestive) heart failure ([place]) 01/01/2016  . Cataract    BILATERAL REMOVED  . Chronic low back pain   . COPD (chronic obstructive pulmonary disease) ([place])   . Coronary artery disease    a. multiple PCIs to the mLCx. b. stent to dRCA 08/2011 in setting of NSTEMI. c. DES to prox-mid LCx for ISR 09/2011. d.  DESx2 to prox-mRCA 07/2012. e. Takotsubo event 12/2015 with patent stents.  . Depression   . Diabetes mellitus   . Diverticulosis   . Frequent PVCs    a. Noted in hospital 12/2015.  Marland Kitchen GERD (gastroesophageal reflux disease)   . Hyperlipidemia   . Hypertension   .  Hypothyroidism   . Impingement syndrome of left shoulder   . Marijuana abuse   . Myocardial infarction ([place])    x 5  . Sleep apnea    mild-does not use cpap  . Takotsubo cardiomyopathy    a. 12/2015 - nephew committed suicide 1 week prior, sister died the morning of presentation - initially called a STEMI; cath with patent stents. LVEF 25-30%.  . Tendonitis of left rotator cuff   . Tobacco abuse   . Vascular dementia     Past Surgical History:  Procedure Laterality Date  . ABDOMINAL HYSTERECTOMY    . APPENDECTOMY    . BACK SURGERY    . CARDIAC CATHETERIZATION  2013  . CARDIAC CATHETERIZATION  2016  . CARDIAC CATHETERIZATION N/A 01/01/2016   Procedure: Left Heart Cath and Coronary Angiography;  Surgeon: Jettie Booze, MD;  Location: [facility];  Service: Cardiovascular;  Laterality: N/A;  . CATARACT EXTRACTION W/PHACO Left 01/30/2015   Procedure: CATARACT EXTRACTION PHACO AND INTRAOCULAR LENS PLACEMENT (IOC);  Surgeon: Birder Robson, MD;  Location: [facility];  Service: Ophthalmology;  Laterality: Left;  [place] 00:47   . CATARACT EXTRACTION W/PHACO Right 02/13/2015   Procedure: CATARACT EXTRACTION PHACO AND INTRAOCULAR LENS PLACEMENT (IOC);  Surgeon: Birder Robson, MD;  Location: [facility];  Service: Ophthalmology;  Laterality: Right;  cassette lot # [id number] H [place]  00:29.9 AP  20.7 CDE  6.20  . COLONOSCOPY N/A 11/02/2014  Procedure: COLONOSCOPY;  Surgeon: Inda Castle, MD;  Location: [facility];  Service: Endoscopy;  Laterality: N/A;  . CORONARY ANGIOPLASTY  2012   stent x 3   . CORONARY ANGIOPLASTY WITH STENT PLACEMENT  2013  . ESOPHAGOGASTRODUODENOSCOPY (EGD) WITH PROPOFOL N/A 09/21/2018   Procedure: ESOPHAGOGASTRODUODENOSCOPY (EGD) WITH PROPOFOL;  Surgeon: [name], MD;  Location: [facility];  Service: Gastroenterology;  Laterality: N/A;  . SHOULDER SURGERY Left 2017     Current Outpatient Medications  Medication Sig Dispense Refill  . albuterol (VENTOLIN  HFA) 108 (90 Base) MCG/ACT inhaler Inhale 2 puffs into the lungs every 6 (six) hours as needed for wheezing or shortness of breath. 8 g 0  . amitriptyline (ELAVIL) 50 MG tablet Take 1 tablet (50 mg total) by mouth at bedtime. 30 tablet 5  . carvedilol (COREG) 6.25 MG tablet Take 1 tablet (6.25 mg total) by mouth 2 (two) times daily. 180 tablet 3  . clobetasol ointment (TEMOVATE) 0.05 % APPLY EVERY NIGHT TO VAGINAL FOLDS 30 g 0  . clopidogrel (PLAVIX) 75 MG tablet Take 1 tablet (75 mg total) by mouth daily. 90 tablet 3  . enalapril (VASOTEC) 20 MG tablet Take 1 tablet by mouth once daily for blood pressure. 90 tablet 3  . ezetimibe (ZETIA) 10 MG tablet Take 1 tablet (10 mg total) by mouth daily. For cholesterol. 90 tablet 3  . furosemide (LASIX) 20 MG tablet Take 1 tablet (20 mg total) by mouth daily as needed for edema. 30 tablet 2  . gabapentin (NEURONTIN) 300 MG capsule Take 1 capsule (300 mg total) by mouth 3 (three) times daily. 90 capsule 5  . glucose blood (FREESTYLE LITE) test strip Test up to four times a day as needed. DX 100 each 12  . insulin aspart (NOVOLOG FLEXPEN) 100 UNIT/ML FlexPen Inject 25 units before breakfast, 25 units before lunch, and inject 25 units before dinner. 30 mL 3  . insulin glargine (LANTUS SOLOSTAR) 100 UNIT/ML Solostar Pen Inject 30 Units into the skin 2 (two) times daily. 30 mL 3  . Insulin Pen Needle (INSUPEN PEN NEEDLES) 32G X 4 MM MISC Use to inject insulin 4 times daily. 250 each 1  . isosorbide mononitrate (IMDUR) 30 MG 24 hr tablet Take 1 tablet (30 mg total) by mouth daily. 90 tablet 3  . levothyroxine (SYNTHROID) 88 MCG tablet Take 1 tablet by mouth every morning on an empty stomach with water only.  No food or other medications for 30 minutes. 90 tablet 0  . meloxicam (MOBIC) 15 MG tablet Take 1 tablet (15 mg total) by mouth daily. 30 tablet 5  . neomycin-polymyxin b-dexamethasone (MAXITROL) 3.5-10000-0.1 SUSP Place 1 drop into the left eye 3 (three) times  daily.    . rosuvastatin (CRESTOR) 40 MG tablet Take 1 tablet (40 mg total) by mouth every evening. For cholesterol. 90 tablet 3  . traZODone (DESYREL) 100 MG tablet TAKE 1 TABLET BY MOUTH AT BEDTIME AS NEEDED FOR SLEEP 90 tablet 1  . varenicline (CHANTIX) 1 MG tablet Take 1 tablet (1 mg total) by mouth 2 (two) times daily. (Patient not taking: Reported on 08/30/2020) 60 tablet 3   No current facility-administered medications for this visit.    Allergies:   No known allergies    Social History:  The patient  reports that she has been smoking cigarettes. She has a 45.00 pack-year smoking history. She has never used smokeless tobacco. She reports current drug use. Drug: Marijuana. She reports that she  does not drink alcohol.   Family History:  The patient's family history includes Colon cancer in her sister; Heart attack in her mother; Heart disease in her father and mother; Liver cancer in her brother; Throat cancer in her brother.    ROS:  Please see the history of present illness.   Otherwise, review of systems are positive for none.   All other systems are reviewed and negative.    PHYSICAL EXAM: VS:  BP (!) 142/92   Pulse 69   Ht 5\' 7"  (1.702 m)   Wt 159 lb (72.1 kg)   BMI 24.90 kg/m  , BMI Body mass index is 24.9 kg/m. GEN: Well nourished, well developed, in no acute distress  HEENT: normal  Neck: no JVD, carotid bruits, or masses Cardiac: RRR; no murmurs, rubs, or gallops,no edema  Respiratory:  clear to auscultation bilaterally, normal work of breathing GI: soft, nontender, nondistended, + BS MS: no deformity or atrophy  Skin: warm and dry, no rash Neuro:  Strength and sensation are intact Psych: euthymic mood, full affect Vascular: Femoral pulses normal bilaterally.  Distal pulses are palpable on the right side but not the left side   EKG:  EKG is ordered today. The ekg ordered today demonstrates sinus rhythm with first-degree AV block and possible old septal  infarct.   Recent Labs: 04/02/2020: Hemoglobin 13.7; Platelets 246.0 08/03/2020: ALT 22; BUN 27; Creatinine, Ser 1.21; Potassium 4.6; Sodium 137; TSH 4.62    Lipid Panel    Component Value Date/Time   CHOL 102 08/03/2020 1005   CHOL 200 (H) 08/29/2019 0947   CHOL 292 (H) 08/02/2014 0408   TRIG 171.0 (H) 08/03/2020 1005   TRIG 388 (H) 08/02/2014 0408   HDL 38.70 (L) 08/03/2020 1005   HDL 34 (L) 08/29/2019 0947   HDL 34 (L) 08/02/2014 0408   CHOLHDL 3 08/03/2020 1005   VLDL 34.2 08/03/2020 1005   VLDL 78 (H) 08/02/2014 0408   LDLCALC 29 08/03/2020 1005   LDLCALC 131 (H) 08/29/2019 0947   LDLCALC 180 (H) 08/02/2014 0408   LDLDIRECT 143.0 12/24/2018 1227      Wt Readings from Last 3 Encounters:  08/30/20 159 lb (72.1 kg)  08/03/20 154 lb (69.9 kg)  07/04/20 154 lb (69.9 kg)        No flowsheet data found.    ASSESSMENT AND PLAN:  1. Peripheral arterial disease: Severe left calf claudication with some rest pain at night.  No lower extremity ulceration.  He has subtotal occlusion of the left distal SFA which is the likely culprit.  Given severity of her symptoms, I recommend proceeding with abdominal aortogram with left lower extremity angiography and possible endovascular intervention.  I discussed the procedure in details as well as risk and benefits. Planned access is via the right common femoral artery.  Plan 4 hours of hydration given mild chronic kidney disease. She is already on dual antiplatelet therapy with aspirin and clopidogrel  2. Coronary artery disease involving native coronary arteries: No anginal symptoms at the present time.  3. Hyperlipidemia: Continue treatment with rosuvastatin and Zetia.  Most recent lipid profile showed an LDL of 29.  4. Chronic systolic heart failure: She appears to be euvolemic  5. Essential hypertension: Blood pressure is mildly elevated but she has not taken her medications.  6. Tobacco use: Smoking cessation is  advised    Disposition:   FU with me in 1 month  Signed,  Kathlyn Sacramento, MD  08/30/2020 9:07 AM  [facility]

## 2020-08-30 ENCOUNTER — Encounter: Payer: Self-pay | Admitting: Cardiovascular Disease

## 2020-08-30 ENCOUNTER — Ambulatory Visit (INDEPENDENT_AMBULATORY_CARE_PROVIDER_SITE_OTHER): Payer: Medicare PPO | Admitting: Cardiovascular Disease

## 2020-08-30 ENCOUNTER — Other Ambulatory Visit: Payer: Self-pay

## 2020-08-30 VITALS — BP 142/92 | HR 69 | Ht 67.0 in | Wt 159.0 lb

## 2020-08-30 DIAGNOSIS — I739 Peripheral vascular disease, unspecified: Secondary | ICD-10-CM

## 2020-08-30 DIAGNOSIS — I5022 Chronic systolic (congestive) heart failure: Secondary | ICD-10-CM

## 2020-08-30 DIAGNOSIS — I2510 Atherosclerotic heart disease of native coronary artery without angina pectoris: Secondary | ICD-10-CM

## 2020-08-30 DIAGNOSIS — Z720 Tobacco use: Secondary | ICD-10-CM | POA: Diagnosis not present

## 2020-08-30 DIAGNOSIS — E785 Hyperlipidemia, unspecified: Secondary | ICD-10-CM

## 2020-08-30 NOTE — Patient Instructions (Signed)
Medication Instructions:  Your physician recommends that you continue on your current medications as directed. Please refer to the Current Medication list given to you today.  *If you need a refill on your cardiac medications before your next appointment, please call your pharmacy*   Lab Work: Bmet and Cbc today If you have labs (blood work) drawn today and your tests are completely normal, you will receive your results only by: Marland Kitchen MyChart Message (if you have MyChart) OR . A paper copy in the mail If you have any lab test that is abnormal or we need to change your treatment, we will call you to review the results.   Testing/Procedures: You are scheduled scheduled for an abdominal aortogram. Please see the instructions for the procedure below.   Follow-Up: At [facility], you and your health needs are our priority.  As part of our continuing mission to provide you with exceptional heart care, we have created designated Provider Care Teams.  These Care Teams include your primary Cardiologist (physician) and Advanced Practice Providers (APPs -  Physician Assistants and Nurse Practitioners) who all work together to provide you with the care you need, when you need it.  We recommend signing up for the patient portal called "MyChart".  Sign up information is provided on this After Visit Summary.  MyChart is used to connect with patients for Virtual Visits (Telemedicine).  Patients are able to view lab/test results, encounter notes, upcoming appointments, etc.  Non-urgent messages can be sent to your provider as well.   To learn more about what you can do with MyChart, go to [email or web address].    Your next appointment:   4 week(s)  The format for your next appointment:   In Person  Provider:   You may see Kathlyn Sacramento, MD or one of the following Advanced Practice Providers on your designated Care Team:    Murray Hodgkins, NP  Christell Faith, PA-C  [name],  PA-C  Cadence Kathlen Mody, [place]  Laurann Montana, NP    Other Instructions    [facility] [place], [place] [address] Dept: [place]: [place]  08/30/2020  You are scheduled for a Peripheral Angiogram on Wednesday, February 23 with Dr. Kathlyn Sacramento.  1. Please arrive at the [facility] (Main Entrance A) at [employer]: [address] [place], [place] [address] at 7:30 AM (This time is two hours before your procedure to ensure your preparation). Free valet parking service is available.   Special note: Every effort is made to have your procedure done on time. Please understand that emergencies sometimes delay scheduled procedures.  2. Diet: Do not eat solid foods after midnight.  The patient may have clear liquids until 5am upon the day of the procedure.  3. Labs: You will need to have blood drawn today (Bmet, Cbc)  You will need a COVID test on Monday 09/10/20. Please report to the [facility] medical arts building drive up test site between 8am-1pm. We ask that you self quarantine after your COVID test up to the day of the procedure.  4. Medication instructions in preparation for your procedure:   Contrast Allergy: No      Stop taking, Lasix (Furosemide)  Wednesday, February 23,   Take only 12 units of insulin the night before your procedure. Do not take any insulin on the day of the procedure.    On the morning of your  procedure, take your Aspirin and any morning medicines NOT listed above.  You may use sips of water.  5. Plan for one night stay--bring personal belongings. 6. Bring a current list of your medications and current insurance cards. 7. You MUST have a responsible person to drive you home. 8. Someone MUST be with you the first 24 hours after you arrive home or your discharge will be delayed. 9. Please wear clothes that are easy to  get on and off and wear slip-on shoes.  Thank you for allowing [place] to care for you!   -- [facility]

## 2020-08-31 LAB — BASIC METABOLIC PANEL
BUN/Creatinine Ratio: 15 (ref 12–28)
BUN: 17 mg/dL (ref 8–27)
CO2: 23 mmol/L (ref 20–29)
Calcium: 9 mg/dL (ref 8.7–10.3)
Chloride: 102 mmol/L (ref 96–106)
Creatinine, Ser: 1.15 mg/dL — ABNORMAL HIGH (ref 0.57–1.00)
GFR calc Af Amer: 55 mL/min/{1.73_m2} — ABNORMAL LOW (ref >59)
GFR calc non Af Amer: 48 mL/min/{1.73_m2} — ABNORMAL LOW (ref >59)
Glucose: 215 mg/dL — ABNORMAL HIGH (ref 65–99)
Potassium: 4.2 mmol/L (ref 3.5–5.2)
Sodium: 138 mmol/L (ref 134–144)

## 2020-08-31 LAB — CBC WITH DIFFERENTIAL/PLATELET
Basophils Absolute: 0 10*3/uL (ref 0.0–0.2)
Basos: 0 %
EOS (ABSOLUTE): 0.1 10*3/uL (ref 0.0–0.4)
Eos: 2 %
Hematocrit: 41.5 % (ref 34.0–46.6)
Hemoglobin: 13.8 g/dL (ref 11.1–15.9)
Immature Grans (Abs): 0 10*3/uL (ref 0.0–0.1)
Immature Granulocytes: 0 %
Lymphocytes Absolute: 2.6 10*3/uL (ref 0.7–3.1)
Lymphs: 30 %
MCH: 30.9 pg (ref 26.6–33.0)
MCHC: 33.3 g/dL (ref 31.5–35.7)
MCV: 93 fL (ref 79–97)
Monocytes Absolute: 0.6 10*3/uL (ref 0.1–0.9)
Monocytes: 7 %
Neutrophils Absolute: 5.1 10*3/uL (ref 1.4–7.0)
Neutrophils: 61 %
Platelets: 242 10*3/uL (ref 150–450)
RBC: 4.47 x10E6/uL (ref 3.77–5.28)
RDW: 12.6 % (ref 11.7–15.4)
WBC: 8.4 10*3/uL (ref 3.4–10.8)

## 2020-09-03 ENCOUNTER — Ambulatory Visit: Payer: Medicare PPO | Admitting: Family Medicine

## 2020-09-03 DIAGNOSIS — Z0289 Encounter for other administrative examinations: Secondary | ICD-10-CM

## 2020-09-03 NOTE — Progress Notes (Deleted)
    Debera Sterba T. Shenique Childers, MD, [place]  Primary Care and Sports Medicine [facility] at [facility] [place], [address]  Phone: [phone]  FAX: [phone]  WHITLEE SLUDER - 75 y.o. female  MRN [id number]  Date of Birth: 12-21-1945  Date: 09/03/2020  PCP: [name], NP  Referral: [name], NP  No chief complaint on file.   This visit occurred during the SARS-CoV-2 public health emergency.  Safety protocols were in place, including screening questions prior to the visit, additional usage of staff PPE, and extensive cleaning of exam room while observing appropriate contact time as indicated for disinfecting solutions.   Subjective:   Sarah Payne is a 75 y.o. very pleasant female patient with There is no height or weight on file to calculate BMI. who presents with the following:  She is a pleasant lady who is here for follow-up regarding her shoulder.  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  She also has some significant glenohumeral arthritis on the left.  These films were independently reviewed by myself on the day of office visit, and she has at least moderate level of osteoarthritis of the glenohumeral joint.  There is significant osteophytosis present and loss of joint space.  She is here today in follow-up to discuss additional options and treatment.  Review of Systems is noted in the HPI, as appropriate   Objective:   There were no vitals taken for this visit.  ***  Radiology:   Assessment and Plan:   ***

## 2020-09-07 ENCOUNTER — Other Ambulatory Visit (INDEPENDENT_AMBULATORY_CARE_PROVIDER_SITE_OTHER): Payer: Medicare PPO

## 2020-09-07 ENCOUNTER — Other Ambulatory Visit: Payer: Self-pay

## 2020-09-07 DIAGNOSIS — E039 Hypothyroidism, unspecified: Secondary | ICD-10-CM | POA: Diagnosis not present

## 2020-09-07 LAB — TSH: TSH: 5.36 u[IU]/mL — ABNORMAL HIGH (ref 0.35–4.50)

## 2020-09-09 ENCOUNTER — Other Ambulatory Visit: Payer: Self-pay | Admitting: Primary Care

## 2020-09-09 DIAGNOSIS — E039 Hypothyroidism, unspecified: Secondary | ICD-10-CM

## 2020-09-09 MED ORDER — LEVOTHYROXINE SODIUM 100 MCG PO TABS: ORAL_TABLET | ORAL | 0 refills | Status: DC

## 2020-09-10 ENCOUNTER — Other Ambulatory Visit
Admission: RE | Admit: 2020-09-10 | Discharge: 2020-09-10 | Disposition: A | Discharge status: Home / Self Care | Payer: Medicare PPO | Source: Ambulatory Visit | Attending: Cardiovascular Disease | Admitting: Cardiovascular Disease

## 2020-09-10 ENCOUNTER — Encounter: Payer: Self-pay | Admitting: Family Medicine

## 2020-09-10 ENCOUNTER — Other Ambulatory Visit: Payer: Self-pay

## 2020-09-10 ENCOUNTER — Ambulatory Visit (INDEPENDENT_AMBULATORY_CARE_PROVIDER_SITE_OTHER): Payer: Medicare PPO | Admitting: Family Medicine

## 2020-09-10 VITALS — BP 142/84 | HR 75 | Temp 97.0°F | Ht 67.0 in | Wt 156.5 lb

## 2020-09-10 DIAGNOSIS — Z01812 Encounter for preprocedural laboratory examination: Secondary | ICD-10-CM | POA: Diagnosis not present

## 2020-09-10 DIAGNOSIS — M19012 Primary osteoarthritis, left shoulder: Secondary | ICD-10-CM

## 2020-09-10 DIAGNOSIS — Z20822 Contact with and (suspected) exposure to covid-19: Secondary | ICD-10-CM | POA: Diagnosis not present

## 2020-09-10 LAB — SARS CORONAVIRUS 2 (TAT 6-24 HRS): SARS Coronavirus 2: NEGATIVE

## 2020-09-10 MED ORDER — TRIAMCINOLONE ACETONIDE 40 MG/ML IJ SUSP
40.0000 mg | Freq: Once | INTRAMUSCULAR | Status: AC
Start: 2020-09-10 — End: 2020-09-10
  Administered 2020-09-10: 40 mg via INTRA_ARTICULAR

## 2020-09-10 MED ORDER — TRIAMCINOLONE ACETONIDE 40 MG/ML IJ SUSP
40.0000 mg | Freq: Once | INTRAMUSCULAR | Status: DC
Start: 2020-09-10 — End: 2020-09-10

## 2020-09-10 NOTE — Progress Notes (Signed)
Sarah Payne T. Sarah Giron, MD, [place]  Primary Care and Sports Medicine [facility] at [facility] [place], [address]  Phone: [phone]  FAX: [phone]  Sarah Payne - 75 y.o. female  MRN [id number]  Date of Birth: April 05, 1946  Date: 09/10/2020  PCP: [name], NP  Referral: [name], NP  Chief Complaint  Patient presents with  . Shoulder Pain    This visit occurred during the SARS-CoV-2 public health emergency.  Safety protocols were in place, including screening questions prior to the visit, additional usage of staff PPE, and extensive cleaning of exam room while observing appropriate contact time as indicated for disinfecting solutions.   Subjective:   Sarah Payne is a 75 y.o. very pleasant female patient with Body mass index is 24.51 kg/m. who presents with the following:  Abdominal aortogram in 2 days, 09/12/2020.  3rd week, no real known accidents or anything.  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  We reviewed her shoulder x-rays from June 2020.  At that point she had rather severe glenohumeral osteoarthritis.  Her motion has continued to worsen.  She has pain in her shoulder much of the time.  She has had a flareup for some unknown reason for the last 3 weeks.  Previously when I did do an intra-articular injection, she had release of symptoms for approaching a year.  Review of Systems is noted in the HPI, as appropriate   Objective:   BP (!) 142/84   Pulse 75   Temp (!) 97 F (36.1 C) (Temporal)   Ht 5\' 7"  (1.702 m)   Wt 156 lb 8 oz (71 kg)   SpO2 97%   BMI 24.51 kg/m   Left shoulder: Tender to palpation at the [facility] joint.  Tender almost globally around the humerus.  Tender in the bicipital groove.  She has marked restriction in all directions with the shoulder on the left.  Pain with virtually all  movement.  Radiology: CLINICAL DATA:  Left shoulder pain for several weeks, no known injury, initial encounter  EXAM: LEFT SHOULDER - 2+ VIEW  COMPARISON:  None.  FINDINGS: Degenerative changes of the glenohumeral articulation and acromioclavicular joint are seen. No fracture or dislocation is noted. The underlying rib cage is unremarkable.  IMPRESSION: Degenerative change without acute abnormality.   Electronically Signed   By: Inez Catalina M.D.   On: 01/17/2019 10:17   Assessment and Plan:     ICD-10-CM   1. Glenohumeral arthritis, left  M19.012 triamcinolone acetonide (KENALOG-40) injection 40 mg    DISCONTINUED: triamcinolone acetonide (KENALOG-40) injection 40 mg   Glenohumeral osteoarthritis, severe, with exacerbation.  I would think almost certainly she has had advancement of disease.  Given her pain level right now and lack of movement, she and I both decided that additional radiographs would not alter her plan of care.  I did confirm with Dr. Rockey Situ that this would be appropriate and not cause any issues with her upcoming aortogram.  Intraarticular Shoulder Aspiration/Injection Procedure Note TRISTEN PENNINO 08/03/1945 Date of procedure: 09/10/2020  Procedure: Large Joint Aspiration / Injection of Shoulder, Intraarticular, L Indications: Pain  Procedure Details Verbal consent was obtained from the patient. Risks explained and contrasted with benefits and alternatives. Patient prepped with Chloraprep and Ethyl Chloride used for anesthesia. An intraarticular shoulder injection was performed using the posterior approach; needle placed into joint capsule without difficulty. The  patient tolerated the procedure well and had decreased pain post injection. No complications. Injection: 9 cc of Lidocaine 1% and 1 mL Kenalog 40 mg. Needle: 21 gauge, 2 inch  Meds ordered this encounter  Medications  . DISCONTD: triamcinolone acetonide (KENALOG-40) injection 40 mg   . triamcinolone acetonide (KENALOG-40) injection 40 mg   Medications Discontinued During This Encounter  Medication Reason  . triamcinolone acetonide (KENALOG-40) injection 40 mg    No orders of the defined types were placed in this encounter.   Follow-up: No follow-ups on file.  Signed,  Maud Deed. Stephane Niemann, MD   Outpatient Encounter Medications as of 09/10/2020  Medication Sig  . albuterol (VENTOLIN HFA) 108 (90 Base) MCG/ACT inhaler Inhale 2 puffs into the lungs every 6 (six) hours as needed for wheezing or shortness of breath.  Marland Kitchen amitriptyline (ELAVIL) 50 MG tablet Take 1 tablet (50 mg total) by mouth at bedtime.  . carvedilol (COREG) 6.25 MG tablet Take 1 tablet (6.25 mg total) by mouth 2 (two) times daily.  . clobetasol ointment (TEMOVATE) 0.05 % APPLY EVERY NIGHT TO VAGINAL FOLDS  . clopidogrel (PLAVIX) 75 MG tablet Take 1 tablet (75 mg total) by mouth daily.  . enalapril (VASOTEC) 20 MG tablet Take 1 tablet by mouth once daily for blood pressure.  . ezetimibe (ZETIA) 10 MG tablet Take 1 tablet (10 mg total) by mouth daily. For cholesterol.  . furosemide (LASIX) 20 MG tablet Take 1 tablet (20 mg total) by mouth daily as needed for edema.  . gabapentin (NEURONTIN) 300 MG capsule Take 1 capsule (300 mg total) by mouth 3 (three) times daily.  Marland Kitchen glucose blood (FREESTYLE LITE) test strip Test up to four times a day as needed. DX  . insulin aspart (NOVOLOG FLEXPEN) 100 UNIT/ML FlexPen Inject 25 units before breakfast, 25 units before lunch, and inject 25 units before dinner.  . insulin glargine (LANTUS SOLOSTAR) 100 UNIT/ML Solostar Pen Inject 30 Units into the skin 2 (two) times daily.  . Insulin Pen Needle (INSUPEN PEN NEEDLES) 32G X 4 MM MISC Use to inject insulin 4 times daily.  . isosorbide mononitrate (IMDUR) 30 MG 24 hr tablet Take 1 tablet (30 mg total) by mouth daily.  Marland Kitchen levothyroxine (SYNTHROID) 100 MCG tablet Take 1 tablet by mouth every morning on an empty stomach with  water only.  No food or other medications for 30 minutes.  . meloxicam (MOBIC) 15 MG tablet Take 1 tablet (15 mg total) by mouth daily.  Marland Kitchen neomycin-polymyxin b-dexamethasone (MAXITROL) 3.5-10000-0.1 SUSP Place 1 drop into the left eye 3 (three) times daily.  . rosuvastatin (CRESTOR) 40 MG tablet Take 1 tablet (40 mg total) by mouth every evening. For cholesterol.  . traZODone (DESYREL) 100 MG tablet TAKE 1 TABLET BY MOUTH AT BEDTIME AS NEEDED FOR SLEEP  . varenicline (CHANTIX) 1 MG tablet Take 1 tablet (1 mg total) by mouth 2 (two) times daily.  . [EXPIRED] triamcinolone acetonide (KENALOG-40) injection 40 mg   . [DISCONTINUED] triamcinolone acetonide (KENALOG-40) injection 40 mg    No facility-administered encounter medications on file as of 09/10/2020.

## 2020-09-11 ENCOUNTER — Telehealth: Payer: Self-pay | Admitting: *Deleted

## 2020-09-11 NOTE — Telephone Encounter (Addendum)
Pt contacted pre-abdominal aortogram  scheduled at [facility] for: Wednesday September 12, 2020 12 Noon Verified arrival time and place: [facility]) at: 7:30 AM-pre-procedure hydration   No solid food after midnight prior to cath, clear liquids until 5 AM day of procedure.  Hold: Lasix-day AM of procedure-GFR 48 Enalapril-AM of procedure-GFR 48 Insulin-AM of procedure/ 1/2 usual Insulin HS prior to procedure  Except hold medications AM meds can be  taken pre-cath with sips of water including: ASA 81 mg Plavix 75 mg  Confirmed patient has responsible adult to drive home post procedure and be with patient first 24 hours after arriving home: yes  You are allowed ONE visitor in the waiting room during the time you are at the hospital for your procedure. Both you and your visitor must wear a mask once you enter the hospital.     Reviewed procedure/mask/visitor instructions pre-procedure hydration with patient.

## 2020-09-12 ENCOUNTER — Encounter (HOSPITAL_COMMUNITY)
Admission: RE | Disposition: A | Discharge status: Home / Self Care | Payer: Medicare PPO | Source: Home / Self Care | Attending: Cardiovascular Disease

## 2020-09-12 ENCOUNTER — Other Ambulatory Visit: Payer: Self-pay

## 2020-09-12 ENCOUNTER — Ambulatory Visit (HOSPITAL_COMMUNITY)
Admission: RE | Admit: 2020-09-12 | Discharge: 2020-09-12 | Disposition: A | Discharge status: Home / Self Care | Payer: Medicare PPO | Attending: Cardiovascular Disease | Admitting: Cardiovascular Disease

## 2020-09-12 DIAGNOSIS — Z794 Long term (current) use of insulin: Secondary | ICD-10-CM | POA: Insufficient documentation

## 2020-09-12 DIAGNOSIS — I130 Hypertensive heart and chronic kidney disease with heart failure and stage 1 through stage 4 chronic kidney disease, or unspecified chronic kidney disease: Secondary | ICD-10-CM | POA: Insufficient documentation

## 2020-09-12 DIAGNOSIS — Z79890 Hormone replacement therapy (postmenopausal): Secondary | ICD-10-CM | POA: Insufficient documentation

## 2020-09-12 DIAGNOSIS — Z7902 Long term (current) use of antithrombotics/antiplatelets: Secondary | ICD-10-CM | POA: Insufficient documentation

## 2020-09-12 DIAGNOSIS — I739 Peripheral vascular disease, unspecified: Secondary | ICD-10-CM

## 2020-09-12 DIAGNOSIS — E1151 Type 2 diabetes mellitus with diabetic peripheral angiopathy without gangrene: Secondary | ICD-10-CM | POA: Diagnosis not present

## 2020-09-12 DIAGNOSIS — I70222 Atherosclerosis of native arteries of extremities with rest pain, left leg: Secondary | ICD-10-CM | POA: Diagnosis not present

## 2020-09-12 DIAGNOSIS — N189 Chronic kidney disease, unspecified: Secondary | ICD-10-CM | POA: Diagnosis not present

## 2020-09-12 DIAGNOSIS — Z79899 Other long term (current) drug therapy: Secondary | ICD-10-CM | POA: Insufficient documentation

## 2020-09-12 DIAGNOSIS — F17210 Nicotine dependence, cigarettes, uncomplicated: Secondary | ICD-10-CM | POA: Insufficient documentation

## 2020-09-12 DIAGNOSIS — E785 Hyperlipidemia, unspecified: Secondary | ICD-10-CM | POA: Diagnosis not present

## 2020-09-12 DIAGNOSIS — I5022 Chronic systolic (congestive) heart failure: Secondary | ICD-10-CM | POA: Insufficient documentation

## 2020-09-12 DIAGNOSIS — I2510 Atherosclerotic heart disease of native coronary artery without angina pectoris: Secondary | ICD-10-CM | POA: Diagnosis not present

## 2020-09-12 DIAGNOSIS — E1122 Type 2 diabetes mellitus with diabetic chronic kidney disease: Secondary | ICD-10-CM | POA: Insufficient documentation

## 2020-09-12 HISTORY — PX: PERIPHERAL VASCULAR BALLOON ANGIOPLASTY: CATH118281

## 2020-09-12 HISTORY — PX: PERIPHERAL VASCULAR ATHERECTOMY: CATH118256

## 2020-09-12 HISTORY — PX: ABDOMINAL AORTOGRAM W/LOWER EXTREMITY: CATH118223

## 2020-09-12 LAB — GLUCOSE, CAPILLARY: Glucose-Capillary: 194 mg/dL — ABNORMAL HIGH (ref 70–99)

## 2020-09-12 LAB — POCT ACTIVATED CLOTTING TIME: Activated Clotting Time: 267 seconds

## 2020-09-12 SURGERY — ABDOMINAL AORTOGRAM W/LOWER EXTREMITY
Anesthesia: LOCAL

## 2020-09-12 MED ORDER — FENTANYL CITRATE (PF) 100 MCG/2ML IJ SOLN
INTRAMUSCULAR | Status: DC | PRN
  Administered 2020-09-12: 25 ug via INTRAVENOUS
  Administered 2020-09-12 (×2): 50 ug via INTRAVENOUS

## 2020-09-12 MED ORDER — SODIUM CHLORIDE 0.9% FLUSH: 3.0000 mL | Freq: Two times a day (BID) | INTRAVENOUS | Status: DC

## 2020-09-12 MED ORDER — SODIUM CHLORIDE 0.9 % IV SOLN: 250.0000 mL | INTRAVENOUS | Status: DC | PRN

## 2020-09-12 MED ORDER — MIDAZOLAM HCL 2 MG/2ML IJ SOLN
INTRAMUSCULAR | Status: DC | PRN
  Administered 2020-09-12 (×2): 1 mg via INTRAVENOUS

## 2020-09-12 MED ORDER — ASPIRIN 81 MG PO CHEW: 81.0000 mg | CHEWABLE_TABLET | ORAL | Status: DC

## 2020-09-12 MED ORDER — HEPARIN SODIUM (PORCINE) 1000 UNIT/ML IJ SOLN
INTRAMUSCULAR | Status: DC | PRN
  Administered 2020-09-12: 6000 [IU] via INTRAVENOUS

## 2020-09-12 MED ORDER — CLOPIDOGREL BISULFATE 75 MG PO TABS
75.0000 mg | ORAL_TABLET | Freq: Once | ORAL | Status: AC
  Administered 2020-09-12: 75 mg via ORAL
  Filled 2020-09-12: qty 1

## 2020-09-12 MED ORDER — HEPARIN (PORCINE) IN NACL 1000-0.9 UT/500ML-% IV SOLN
INTRAVENOUS | Status: DC | PRN
  Administered 2020-09-12: 500 mL

## 2020-09-12 MED ORDER — ACETAMINOPHEN 325 MG PO TABS: 650.0000 mg | ORAL_TABLET | ORAL | Status: DC | PRN

## 2020-09-12 MED ORDER — FENTANYL CITRATE (PF) 100 MCG/2ML IJ SOLN
INTRAMUSCULAR | Status: AC
  Filled 2020-09-12: qty 2

## 2020-09-12 MED ORDER — VERAPAMIL HCL 2.5 MG/ML IV SOLN
INTRAVENOUS | Status: AC
  Filled 2020-09-12: qty 2

## 2020-09-12 MED ORDER — HYDRALAZINE HCL 20 MG/ML IJ SOLN: 5.0000 mg | INTRAMUSCULAR | Status: DC | PRN

## 2020-09-12 MED ORDER — SODIUM CHLORIDE 0.9% FLUSH: 3.0000 mL | INTRAVENOUS | Status: DC | PRN

## 2020-09-12 MED ORDER — HEPARIN SODIUM (PORCINE) 1000 UNIT/ML IJ SOLN
INTRAMUSCULAR | Status: AC
  Filled 2020-09-12: qty 1

## 2020-09-12 MED ORDER — NITROGLYCERIN IN D5W 200-5 MCG/ML-% IV SOLN
INTRAVENOUS | Status: AC
  Filled 2020-09-12: qty 250

## 2020-09-12 MED ORDER — LIDOCAINE HCL (PF) 1 % IJ SOLN
INTRAMUSCULAR | Status: DC | PRN
  Administered 2020-09-12: 30 mL

## 2020-09-12 MED ORDER — MIDAZOLAM HCL 2 MG/2ML IJ SOLN
INTRAMUSCULAR | Status: AC
  Filled 2020-09-12: qty 2

## 2020-09-12 MED ORDER — IODIXANOL 320 MG/ML IV SOLN
INTRAVENOUS | Status: DC | PRN
  Administered 2020-09-12: 115 mL via INTRA_ARTERIAL

## 2020-09-12 MED ORDER — HEPARIN (PORCINE) IN NACL 1000-0.9 UT/500ML-% IV SOLN
INTRAVENOUS | Status: AC
  Filled 2020-09-12: qty 1000

## 2020-09-12 MED ORDER — VIPERSLIDE LUBRICANT OPTIME: TOPICAL | Status: DC | PRN

## 2020-09-12 MED ORDER — ONDANSETRON HCL 4 MG/2ML IJ SOLN: 4.0000 mg | Freq: Four times a day (QID) | INTRAMUSCULAR | Status: DC | PRN

## 2020-09-12 MED ORDER — LIDOCAINE HCL (PF) 1 % IJ SOLN
INTRAMUSCULAR | Status: AC
  Filled 2020-09-12: qty 30

## 2020-09-12 MED ORDER — SODIUM CHLORIDE 0.9 % IV SOLN: INTRAVENOUS | Status: DC

## 2020-09-12 MED ORDER — NITROGLYCERIN 1 MG/10 ML FOR IR/CATH LAB
INTRA_ARTERIAL | Status: DC | PRN
  Administered 2020-09-12 (×2): 200 ug via INTRA_ARTERIAL

## 2020-09-12 SURGICAL SUPPLY — 30 items
BALLN COYOTE OTW 4X120X150 (BALLOONS) ×4
BALLOON COYOTE OTW 4X120X150 (BALLOONS) ×3 IMPLANT
CATH ANGIO 5F PIGTAIL 65CM (CATHETERS) ×4 IMPLANT
CATH CROSS OVER TEMPO 5F (CATHETERS) ×4 IMPLANT
CATH QUICKCROSS .018X135CM (MICROCATHETER) ×4 IMPLANT
CATH STRAIGHT 5FR 65CM (CATHETERS) ×4 IMPLANT
CLOSURE PERCLOSE PROSTYLE (VASCULAR PRODUCTS) ×4 IMPLANT
DCB RANGER 5.0X200 150 (BALLOONS) ×3 IMPLANT
DEVICE EMBOSHIELD NAV6 4.0-7.0 (FILTER) ×4 IMPLANT
DIAMONDBACK SOLID OAS 1.5MM (CATHETERS) ×4
KIT ENCORE 26 ADVANTAGE (KITS) ×4 IMPLANT
KIT MICROPUNCTURE NIT STIFF (SHEATH) ×4 IMPLANT
KIT PV (KITS) ×4 IMPLANT
LUBRICANT VIPERSLIDE CORONARY (MISCELLANEOUS) ×4 IMPLANT
RANGER DCB 5.0X200 150 (BALLOONS) ×4
SHEATH PINNACLE 5F 10CM (SHEATH) ×4 IMPLANT
SHEATH PINNACLE ST 6F 45CM (SHEATH) ×4 IMPLANT
SHEATH PROBE COVER 6X72 (BAG) ×4 IMPLANT
STOPCOCK MORSE 400PSI 3WAY (MISCELLANEOUS) ×4 IMPLANT
SYR MEDRAD MARK 7 150ML (SYRINGE) ×4 IMPLANT
SYSTEM DIMNDBCK SLD OAS 1.5MM (CATHETERS) ×3 IMPLANT
TAPE VIPERTRACK RADIOPAQ (MISCELLANEOUS) ×3 IMPLANT
TAPE VIPERTRACK RADIOPAQUE (MISCELLANEOUS) ×4
TRANSDUCER W/STOPCOCK (MISCELLANEOUS) ×4 IMPLANT
TRAY PV CATH (CUSTOM PROCEDURE TRAY) ×4 IMPLANT
TUBING CIL FLEX 10 FLL-RA (TUBING) ×4 IMPLANT
WIRE BENTSON .035X145CM (WIRE) ×4 IMPLANT
WIRE ROSEN-J .035X180CM (WIRE) ×4 IMPLANT
WIRE RUNTHROUGH .014X300CM (WIRE) ×4 IMPLANT
WIRE VIPER ADVANCE .017X335CM (WIRE) ×8 IMPLANT

## 2020-09-12 NOTE — Discharge Instructions (Signed)
Femoral Site Care  This sheet gives you information about how to care for yourself after your procedure. Your health care provider may also give you more specific instructions. If you have problems or questions, contact your health care provider. What can I expect after the procedure? After the procedure, it is common to have:  Bruising that usually fades within 1-2 weeks.  Tenderness at the site. Follow these instructions at home: Wound care  Follow instructions from your health care provider about how to take care of your insertion site. Make sure you: ? Wash your hands with soap and water before you change your bandage (dressing). If soap and water are not available, use hand sanitizer. ? Change your dressing as told by your health care provider. ? Leave stitches (sutures), skin glue, or adhesive strips in place. These skin closures may need to stay in place for 2 weeks or longer. If adhesive strip edges start to loosen and curl up, you may trim the loose edges. Do not remove adhesive strips completely unless your health care provider tells you to do that.  Do not take baths, swim, or use a hot tub until your health care provider approves.  You may shower 24-48 hours after the procedure or as told by your health care provider. ? Gently wash the site with plain soap and water. ? Pat the area dry with a clean towel. ? Do not rub the site. This may cause bleeding.  Do not apply powder or lotion to the site. Keep the site clean and dry.  Check your femoral site every day for signs of infection. Check for: ? Redness, swelling, or pain. ? Fluid or blood. ? Warmth. ? Pus or a bad smell. Activity  For the first 2-3 days after your procedure, or as long as directed: ? Avoid climbing stairs as much as possible. ? Do not squat.  Do not lift anything that is heavier than 10 lb (4.5 kg), or the limit that you are told, until your health care provider says that it is safe.  Rest as  directed. ? Avoid sitting for a long time without moving. Get up to take short walks every 1-2 hours.  Do not drive for 24 hours if you were given a medicine to help you relax (sedative). General instructions  Take over-the-counter and prescription medicines only as told by your health care provider.  Keep all follow-up visits as told by your health care provider. This is important. Contact a health care provider if you have:  A fever or chills.  You have redness, swelling, or pain around your insertion site. Get help right away if:  The catheter insertion area swells very fast.  You pass out.  You suddenly start to sweat or your skin gets clammy.  The catheter insertion area is bleeding, and the bleeding does not stop when you hold steady pressure on the area.  The area near or just beyond the catheter insertion site becomes pale, cool, tingly, or numb. These symptoms may represent a serious problem that is an emergency. Do not wait to see if the symptoms will go away. Get medical help right away. Call your local emergency services (911 in the [place] Do not drive yourself to the hospital. Summary  After the procedure, it is common to have bruising that usually fades within 1-2 weeks.  Check your femoral site every day for signs of infection.  Do not lift anything that is heavier than 10 lb (4.5 kg), or   the limit that you are told, until your health care provider says that it is safe. This information is not intended to replace advice given to you by your health care provider. Make sure you discuss any questions you have with your health care provider. Document Revised: 03/09/2020 Document Reviewed: 03/09/2020 Elsevier Patient Education  2021 [employer]  

## 2020-09-12 NOTE — Interval H&P Note (Signed)
History and Physical Interval Note:  09/12/2020 12:21 PM  Sarah Payne  has presented today for surgery, with the diagnosis of PAD.  The various methods of treatment have been discussed with the patient and family. After consideration of risks, benefits and other options for treatment, the patient has consented to  Procedure(s): ABDOMINAL AORTOGRAM W/LOWER EXTREMITY (N/A) as a surgical intervention.  The patient's history has been reviewed, patient examined, no change in status, stable for surgery.  I have reviewed the patient's chart and labs.  Questions were answered to the patient's satisfaction.     Kathlyn Sacramento

## 2020-09-12 NOTE — Progress Notes (Signed)
Discharge instructions reviewed with pt and her daughter in law (via telephone) both voice understanding.

## 2020-09-13 ENCOUNTER — Telehealth: Payer: Self-pay

## 2020-09-13 ENCOUNTER — Encounter (HOSPITAL_COMMUNITY): Payer: Self-pay | Admitting: Cardiovascular Disease

## 2020-09-13 DIAGNOSIS — I739 Peripheral vascular disease, unspecified: Secondary | ICD-10-CM

## 2020-09-13 NOTE — Telephone Encounter (Signed)
-----   Message from Wellington Hampshire, MD sent at 09/12/2020  2:17 PM EST ----- Status post left SFA angioplasty.  Schedule ABI and LEA within 2 weeks and follow-up with me after.

## 2020-09-13 NOTE — Telephone Encounter (Signed)
Attempted to schedule 2-28 vascular exam.  On hold.    No ans no vm

## 2020-09-14 NOTE — Telephone Encounter (Signed)
Attempted to schedule.  No ans no vm  

## 2020-09-14 NOTE — Telephone Encounter (Signed)
Scheduled

## 2020-09-17 ENCOUNTER — Other Ambulatory Visit: Payer: Self-pay

## 2020-09-17 ENCOUNTER — Other Ambulatory Visit: Payer: Self-pay | Admitting: Cardiovascular Disease

## 2020-09-17 ENCOUNTER — Ambulatory Visit (INDEPENDENT_AMBULATORY_CARE_PROVIDER_SITE_OTHER): Payer: Medicare PPO

## 2020-09-17 DIAGNOSIS — I739 Peripheral vascular disease, unspecified: Secondary | ICD-10-CM

## 2020-09-17 DIAGNOSIS — Z9862 Peripheral vascular angioplasty status: Secondary | ICD-10-CM

## 2020-09-20 ENCOUNTER — Other Ambulatory Visit: Payer: Self-pay

## 2020-09-20 DIAGNOSIS — I739 Peripheral vascular disease, unspecified: Secondary | ICD-10-CM

## 2020-09-25 NOTE — Progress Notes (Signed)
Chronic Care Management Pharmacy Note  09/27/2020 Name:  Sarah Payne MRN:  [id number] DOB:  1945-08-05  Subjective: Sarah Payne is an 75 y.o. year old female who is a primary patient of [name], NP.  The CCM team was consulted for assistance with disease management and care coordination needs.    Engaged with patient by telephone for follow up visit in response to provider referral for pharmacy case management and/or care coordination services.   Consent to Services:  The patient was given information about Chronic Care Management services, agreed to services, and gave verbal consent prior to initiation of services.  Please see initial visit note for detailed documentation.   Patient Care Team: [name], NP as PCP - General (Internal Medicine) Rockey Situ Kathlene November, MD as PCP - Cardiology (Cardiology) Minna Merritts, MD (Cardiology) Inda Castle, MD (Inactive) as Consulting Physician (Gastroenterology) Meade Maw, MD as Consulting Physician (Neurosurgery) Philemon Kingdom, MD as Consulting Physician (Internal Medicine) Rockey Situ Kathlene November, MD as Consulting Physician (Cardiology) [name], [facility] as Pharmacist (Pharmacist)  Recent office visits:  09/09/20 - Labs - TSH elevated, Increase levothyroxine to 100 mcg daily  08/03/20- Alma Friendly, NP- PCP. Inject Lantus 30 units BID. Inject Novolog 25 units before breakfast, lunch, and dinner if you eat.  10/14/21Alma Friendly, NP- Increased Lantus Solostar from 25 units twice daily to 30 units twice a day.  Recent consult visits:  08/30/20 -Hedy Jacob, MD - Cardiology - PAD, Planned access is via the right common femoral artery.    07/04/20- Dr. Ida Rogue- Cardiology- Restarted patient on Chantix, increased furosemide from every other day to daily.   06/19/20- Dr. Gillis Santa- Pain management- Started patient on amitriptyline 50 mg and meloxicam 15 mg.   05/04/20- Laurann Montana, NP- Cardiology. Started patient on furosemide 20 mg.   04/05/20- Laurann Montana, NP- Cardiology  02/27/20- Dr. Ida Rogue- Cardiology- Started patient on  Ventolin 108 (90 base)  Hospital visits: 09/12/20 - ED - PAD  Objective:  Lab Results  Component Value Date   CREATININE 1.15 (H) 08/30/2020   BUN 17 08/30/2020   GFR 44.80 (L) 08/03/2020   GFRNONAA 48 (L) 08/30/2020   GFRAA 55 (L) 08/30/2020   NA 138 08/30/2020   K 4.2 08/30/2020   CALCIUM 9.0 08/30/2020   CO2 23 08/30/2020    Lab Results  Component Value Date/Time   HGBA1C 10.4 (A) 08/03/2020 09:17 AM   HGBA1C 10.3 (A) 05/03/2020 11:14 AM   HGBA1C 10.3 (H) 12/24/2018 12:27 PM   HGBA1C 10.4 (H) 09/16/2016 11:53 AM   HGBA1C 10.9 (H) 08/02/2014 04:08 AM   HGBA1C 7.3 (H) 08/19/2013 12:19 PM   GFR 44.80 (L) 08/03/2020 10:05 AM   GFR 42.32 (L) 06/04/2020 09:29 AM   MICROALBUR 1.2 05/30/2013 04:48 PM    Last diabetic Eye exam: DUE Lab Results  Component Value Date/Time   HMDIABEYEEXA done, [facility] 03/30/2013 12:00 AM    Last diabetic Foot exam:  02/01/20 with PCP   Lab Results  Component Value Date   CHOL 102 08/03/2020   HDL 38.70 (L) 08/03/2020   LDLCALC 29 08/03/2020   LDLDIRECT 143.0 12/24/2018   TRIG 171.0 (H) 08/03/2020   CHOLHDL 3 08/03/2020    Hepatic Function Latest Ref Rng & Units 08/03/2020 08/29/2019 12/24/2018  Total Protein 6.0 - 8.3 g/dL 6.3 6.3 7.1  Albumin 3.5 - 5.2 g/dL 4.2 4.1 4.3  AST 0 - 37 U/L 23 10 12  ALT 0 - 35 U/L 22 14 10   Alk Phosphatase 39 - 117 U/L 68 91 80  Total Bilirubin 0.2 - 1.2 mg/dL 0.5 0.4 0.4  Bilirubin, Direct 0.0 - 0.3 mg/dL - - -    Lab Results  Component Value Date/Time   TSH 5.36 (H) 09/07/2020 09:55 AM   TSH 4.62 (H) 08/03/2020 10:05 AM   FREET4 0.95 01/18/2018 10:40 AM   FREET4 0.78 04/17/2017 09:43 AM    CBC Latest Ref Rng & Units 08/30/2020 04/02/2020 08/24/2018  WBC 3.4 - 10.8 x10E3/uL 8.4 9.8 10.9(H)  Hemoglobin 11.1 - 15.9 g/dL 13.8 13.7 14.3   Hematocrit 34.0 - 46.6 % 41.5 40.1 43.0  Platelets 150 - 450 x10E3/uL 242 246.0 274    Lab Results  Component Value Date/Time   VD25OH 41 02/25/2013 11:37 AM    Clinical ASCVD: Yes  The ASCVD Risk score Mikey Bussing DC Jr., et al., 2013) failed to calculate for the following reasons:   The patient has a prior MI or stroke diagnosis    Depression screen [facility] 352 2/9 04/02/2020 05/12/2018 04/15/2018  Decreased Interest 3 0 0  Down, Depressed, Hopeless 3 0 0  PHQ - 2 Score 6 0 0  Altered sleeping 3 - -  Tired, decreased energy 3 - -  Change in appetite 0 - -  Feeling bad or failure about yourself  0 - -  Trouble concentrating 0 - -  Moving slowly or fidgety/restless 0 - -  Suicidal thoughts 0 - -  PHQ-9 Score 12 - -  Difficult doing work/chores Extremely dIfficult - -  Some recent data might be hidden    Social History   Tobacco Use  Smoking Status Current Every Day Smoker  . Packs/day: 1.00  . Years: 45.00  . Pack years: 45.00  . Types: Cigarettes  Smokeless Tobacco Never Used  Tobacco Comment   Has cut back, trying to quit.    BP Readings from Last 3 Encounters:  09/27/20 (!) 156/84  09/12/20 (!) 160/72  09/10/20 (!) 142/84   Pulse Readings from Last 3 Encounters:  09/27/20 61  09/12/20 (!) 58  09/10/20 75   Wt Readings from Last 3 Encounters:  09/27/20 155 lb (70.3 kg)  09/12/20 157 lb (71.2 kg)  09/10/20 156 lb 8 oz (71 kg)    Assessment/Interventions: Review of patient past medical history, allergies, medications, health status, including review of consultants reports, laboratory and other test data, was performed as part of comprehensive evaluation and provision of chronic care management services.   SDOH:  (Social Determinants of Health) assessments and interventions performed: Yes SDOH Interventions   Flowsheet Row Most Recent Value  SDOH Interventions   Financial Strain Interventions Intervention Not Indicated  [Medications affordable]      CCM Care  Plan  Allergies  Allergen Reactions  . No Known Allergies     Medications Reviewed Today    Reviewed by Francella Solian (Medical Assistant) on 09/27/20 at [phone]  Med List Status: <None>  Medication Order Taking? Sig Documenting Provider Last Dose Status Informant  albuterol (VENTOLIN HFA) 108 (90 Base) MCG/ACT inhaler [id number] Yes Inhale 2 puffs into the lungs every 6 (six) hours as needed for wheezing or shortness of breath. Minna Merritts, MD Taking Active Self  amitriptyline (ELAVIL) 50 MG tablet [id number] Yes Take 50 mg by mouth at bedtime as needed for sleep. [provider] Taking Active   carvedilol (COREG) 6.25 MG tablet [id number] Yes Take 1 tablet (6.25 mg  total) by mouth 2 (two) times daily. Minna Merritts, MD Taking Active Self  clopidogrel (PLAVIX) 75 MG tablet [id number] Yes Take 1 tablet (75 mg total) by mouth daily. Minna Merritts, MD Taking Active Self  enalapril (VASOTEC) 20 MG tablet [id number] Yes Take 20 mg by mouth daily. [provider] Taking Active   ezetimibe (ZETIA) 10 MG tablet [id number] Yes Take 1 tablet (10 mg total) by mouth daily. For cholesterol. Minna Merritts, MD Taking Active Self  furosemide (LASIX) 20 MG tablet [id number] Yes Take 1 tablet (20 mg total) by mouth daily as needed for edema. Minna Merritts, MD Taking Active Self  gabapentin (NEURONTIN) 300 MG capsule [id number] Yes Take 1 capsule (300 mg total) by mouth 3 (three) times daily. Gillis Santa, MD Taking Active Self  glucose blood (FREESTYLE LITE) test strip [id number] Yes Test up to four times a day as needed. DX [name], NP Taking Active Self  insulin aspart (NOVOLOG FLEXPEN) 100 UNIT/ML FlexPen [id number] Yes Inject 22 Units into the skin 3 (three) times daily with meals. [provider] Taking Active   insulin glargine (LANTUS SOLOSTAR) 100 UNIT/ML Solostar Pen [id number] Yes Inject 30 Units into the skin 2 (two) times daily. [name], NP  Taking Active Self  Insulin Pen Needle (INSUPEN PEN NEEDLES) 32G X 4 MM MISC [id number] Yes Use to inject insulin 4 times daily. [name], NP Taking Active Self  isosorbide mononitrate (IMDUR) 30 MG 24 hr tablet [id number] Yes Take 1 tablet (30 mg total) by mouth daily. Minna Merritts, MD Taking Active Self  levothyroxine (SYNTHROID) 100 MCG tablet [id number] Yes Take 100 mcg by mouth daily before breakfast. On an empty stomach with water only. No food or other medications for 30 minutes. [provider] Taking Active   meloxicam (MOBIC) 15 MG tablet [id number] Yes Take 1 tablet (15 mg total) by mouth daily. Gillis Santa, MD Taking Active Self  neomycin-polymyxin b-dexamethasone (MAXITROL) 3.5-10000-0.1 SUSP [id number] Yes Place 1 drop into the left eye 3 (three) times daily. [provider] Taking Active Self  rosuvastatin (CRESTOR) 40 MG tablet [id number] Yes Take 1 tablet (40 mg total) by mouth every evening. For cholesterol. Minna Merritts, MD Taking Active Self  traZODone (DESYREL) 100 MG tablet [id number] Yes Take 100 mg by mouth at bedtime as needed for sleep. [provider] Taking Active   Med List Note Landis Martins, RN 02/11/18 0086): 02-11-18 Message sent to Dr. Rockey Situ in Epic for permission to stop Plavix 7 days. DW          Patient Active Problem List   Diagnosis Date Noted  . Abdominal bloating 03/22/2019  . Vaginal burning 03/22/2019  . Foul smelling urine 03/22/2019  . Acute pain of left shoulder 01/17/2019  . Chronic musculoskeletal pain 11/15/2018  . Atherosclerosis of native coronary artery of native heart with stable angina pectoris ([place]) 05/17/2018  . Chronic neck pain 02/10/2018  . Chronic pain of right upper extremity 02/10/2018  . Cervical spondylosis 02/10/2018  . Neck pain 01/19/2018  . Right shoulder pain 01/19/2018  . Chronic pain syndrome 01/19/2018  . Lumbar degenerative disc disease 09/29/2017  . Lumbar spondylosis  with myelopathy 09/29/2017  . Chronic bilateral back pain 05/19/2017  . Insomnia 01/12/2017  . Cannabis use disorder, mild, abuse 09/03/2016  . Cognitive changes 09/03/2016  . Takotsubo cardiomyopathy 01/02/2016  . Frequent PVCs 01/02/2016  . HTN (hypertension) 09/20/2015  . Benign neoplasm of colon  11/02/2014  . Internal hemorrhoids 11/02/2014  . Adjustment disorder with mixed anxiety and depressed mood 08/16/2014  . Esophageal reflux 05/23/2014  . Diverticulosis of colon without hemorrhage 05/17/2014  . Peripheral polyneuropathy 05/05/2014  . Tobacco dependence 05/05/2014  . COPD, moderate ([place]) 03/29/2013  . Noncompliance with diabetes treatment 02/27/2013  . Obstructive sleep apnea 02/25/2013  . Chronic systolic congestive heart failure ([place]) 05/21/2012  . Lichen sclerosus et atrophicus 05/20/2012  . Bronchitis, chronic obstructive ([place]) 05/20/2012  . Diabetes mellitus type 2 with peripheral artery disease ([place]) 08/28/2011  . History of lumbar fusion 06/29/2011  . Depression 06/24/2011  . Hypothyroidism 05/03/2011  . CAD, multiple vessel 02/24/2011  . Tobacco abuse 02/24/2011  . Mixed hyperlipidemia 02/24/2011  . Postural dizziness with near syncope 02/24/2011    Immunization History  Administered Date(s) Administered  . Fluad Quad(high Dose 65+) 04/02/2020  . Influenza Split 05/20/2012  . Influenza, High Dose Seasonal PF 04/17/2016, 04/17/2017, 05/04/2018  . Influenza, Seasonal, Injecte, Preservative Fre 05/07/2006  . Influenza,inj,Quad PF,6+ Mos 04/20/2013, 04/26/2014, 06/01/2015, 04/25/2019  . PFIZER(Purple Top)SARS-COV-2 Vaccination 09/15/2019, 10/11/2019, 06/05/2020  . Pneumococcal Conjugate-13 04/26/2014  . Pneumococcal Polysaccharide-23 03/28/2013, 04/25/2019  . Tdap 03/28/2013    Conditions to be addressed/monitored:  Hypertension and Diabetes  Care Plan : [place]  Updates made by [name], [facility] since 09/27/2020 12:00 AM    Problem:  CHL AMB "PATIENT-SPECIFIC PROBLEM"     Long-Range Goal: Disease Management   Start Date: 09/26/2020  Priority: High  Note:    Current Barriers:  . Unable to achieve control of diabetes and blood pressure   Pharmacist Clinical Goal(s):  Marland Kitchen Over the next 90 days, patient will achieve control of diabetes  as evidenced by home BG log through collaboration with PharmD and provider.   Interventions: . 1:1 collaboration with [name], NP regarding development and update of comprehensive plan of care as evidenced by provider attestation and co-signature . Inter-disciplinary care team collaboration (see longitudinal plan of care) . Comprehensive medication review performed; medication list updated in electronic medical record  Hypertension (BP goal <130/80) -Follows with cardiology, reports she has been seen once a week for the past month and BP controlled Query Controlled -Current treatment: . Enalapril 20 mg - 1 tablet daily . Carvedilol 6.29m - 1 BID . Isosorbide mononitrate 30 mg - 1 tablet daily -Medications previously tried: none  -Current home readings: not monitoring BP right now, focusing on sugars -Denies hypotensive/hypertensive symptoms -Educated on BP goals and benefits of medications for prevention of heart attack, stroke and kidney damage; -Counseled to monitor BP at home with symptoms, document, and provide log at future appointments -Recommended to continue current medication; Keep close eye on BP. Follow up call in 3 weeks for BP check.  Hyperlipidemia: (LDL goal < 70) -Controlled -Current treatment: . Rosuvastatin 40 mg - 1 tablet daily . Zetia 10 mg - 1 tablet daily  -Medications previously tried: none -Recommended to continue current medication  Diabetes (A1c goal <7%) -Uncontrolled -Current medications: . Novolog flexpen - 25 units TID before meals (pt taking 30 units TID) . Lantus solostar - 30 units BID (taking 60 units at night) -Medications  previously tried: none -Current home glucose readings: reports checking 5-6 times daily before meals, after meals, bedtime  She ran out of sheets to keep sugar on, using a book now.  Reports on Monday night she changed the Lantus back to once daily because fasting readings were in 200s.  .Marland Kitchen  Fasting glucose: 116 yesterday morning, 120 this morning . Post prandial glucose: 120s or lower the rest of the day o Lowest - 58 the other day, drank bottle of orange juice, came back up to above 70 (reports she may have missed breakfast this day), denies other lows -Current meal patterns: denies any dietary changes, limits potatoes, sweets -Current exercise: walking more, taking dog and walking down street and back about 2-3 times per week -Educated on Prevention and management of hypoglycemic episodes; -Counseled to check feet daily and get yearly eye exams -Recommended to continue current medication; Schedule eye exam. PCP visit next month.  Heart Failure (Goal: manage symptoms and prevent exacerbations) -Controlled -Last ejection fraction: 50-55% (Date: 10/21) -HF type: Systolic  -Last cardio follow up, 08/29/19 -Current treatment: . Lasix 20 mg - 1 tablet daily PRN . Enalapril 20 mg - 1 tablet daily . Carvedilol 6.45m - 1 BID -Medications previously tried: none   -Denies any swelling or SOB. Has not used fluid pill.  -Educated on Importance of weighing daily; if you gain more than 3 pounds in one day or 5 pounds in one week, take Lasix -Recommended to continue current medication  Recent medication changes: -Increased levothyroxine to 100 mcg on Monday 09/24/20 per patient report  Patient Goals/Self-Care Activities . Over the next 30 days, patient will:  - check glucose before meals and bedtime, document, and provide at future appointments weigh daily, and contact provider if weight gain of 3 pounds in 24 hours or 5 pounds in 7 days  -schedule diabetic eye exam    Follow Up Plan: Next PCP  appointment scheduled for:  November 06, 2020 CMA call in 3 weeks to check BP at home. See if patient has scheduled eye exam. Check on BG log.  Medication Assistance: None required.  Patient affirms current coverage meets needs.     [facility]  [facility] [place]Phone: [phone]: [phone] [address] [address]Phone: [phone]: [phone] [place] medication compliance[place]   Care Plan and Follow Up Patient Decision:  Patient agrees to Care Plan and Follow-up.  MDebbora Dus PharmD Clinical Pharmacist [facility]Primary [facility] at [phone]

## 2020-09-26 ENCOUNTER — Other Ambulatory Visit: Payer: Self-pay

## 2020-09-26 ENCOUNTER — Ambulatory Visit (INDEPENDENT_AMBULATORY_CARE_PROVIDER_SITE_OTHER): Payer: Medicare PPO

## 2020-09-26 DIAGNOSIS — E782 Mixed hyperlipidemia: Secondary | ICD-10-CM | POA: Diagnosis not present

## 2020-09-26 DIAGNOSIS — E1151 Type 2 diabetes mellitus with diabetic peripheral angiopathy without gangrene: Secondary | ICD-10-CM | POA: Diagnosis not present

## 2020-09-26 DIAGNOSIS — I5022 Chronic systolic (congestive) heart failure: Secondary | ICD-10-CM

## 2020-09-27 ENCOUNTER — Other Ambulatory Visit: Payer: Self-pay

## 2020-09-27 ENCOUNTER — Ambulatory Visit (INDEPENDENT_AMBULATORY_CARE_PROVIDER_SITE_OTHER): Payer: Medicare PPO | Admitting: Cardiovascular Disease

## 2020-09-27 ENCOUNTER — Encounter: Payer: Self-pay | Admitting: Cardiovascular Disease

## 2020-09-27 VITALS — BP 156/84 | HR 61 | Ht 67.0 in | Wt 155.0 lb

## 2020-09-27 DIAGNOSIS — I2510 Atherosclerotic heart disease of native coronary artery without angina pectoris: Secondary | ICD-10-CM | POA: Diagnosis not present

## 2020-09-27 DIAGNOSIS — E785 Hyperlipidemia, unspecified: Secondary | ICD-10-CM | POA: Diagnosis not present

## 2020-09-27 DIAGNOSIS — I739 Peripheral vascular disease, unspecified: Secondary | ICD-10-CM | POA: Diagnosis not present

## 2020-09-27 DIAGNOSIS — I10 Essential (primary) hypertension: Secondary | ICD-10-CM | POA: Diagnosis not present

## 2020-09-27 DIAGNOSIS — I5022 Chronic systolic (congestive) heart failure: Secondary | ICD-10-CM | POA: Diagnosis not present

## 2020-09-27 NOTE — Progress Notes (Signed)
Cardiology Office Note   Date:  09/27/2020   ID:  Deveney, Bayon 1946-01-30, MRN [id number]  PCP:  [name], NP  Cardiologist:  Dr. Rockey Situ  Chief Complaint  Patient presents with  . Follow-up    4 weeks      History of Present Illness: Sarah Payne is a 75 y.o. female who is here today for follow-up visit regarding peripheral arterial disease.   She has known history of coronary artery disease status post multiple PCIs, chronic systolic heart failure, tobacco use, poorly controlled diabetes, essential hypertension and hyperlipidemia.  She continues to smoke 1 pack/day. She was seen recently for severe left calf claudication with some rest pain at night.   Noninvasive vascular studies showed an ABI of 1.08 on the right and 0.63 on the left with absent toe pressure. Duplex showed no significant aortoiliac disease. There was evidence of subtotal occlusion in the distal left SFA. I proceeded with angiography last month which showed no significant aortoiliac disease.  On the left side, there was severe calcified disease in the distal SFA with two-vessel runoff below the knee with occluded anterior tibial artery.  Posterior tibial artery had moderate diffuse disease in the mid to distal segment.  I performed successful orbital atherectomy and drug-coated balloon angioplasty to the left SFA.  Postprocedure ABI improved to normal with patent left SFA. She reports complete resolution of left calf claudication.  She is doing very well with no chest pain or shortness of breath.  Past Medical History:  Diagnosis Date  . Acute systolic (congestive) heart failure ([place]) 01/01/2016  . Cataract    BILATERAL REMOVED  . Chronic low back pain   . COPD (chronic obstructive pulmonary disease) ([place])   . Coronary artery disease    a. multiple PCIs to the mLCx. b. stent to dRCA 08/2011 in setting of NSTEMI. c. DES to prox-mid LCx for ISR 09/2011. d.  DESx2 to prox-mRCA 07/2012. e. Takotsubo  event 12/2015 with patent stents.  . Depression   . Diabetes mellitus   . Diverticulosis   . Frequent PVCs    a. Noted in hospital 12/2015.  Marland Kitchen GERD (gastroesophageal reflux disease)   . Hyperlipidemia   . Hypertension   . Hypothyroidism   . Impingement syndrome of left shoulder   . Marijuana abuse   . Myocardial infarction ([place])    x 5  . Sleep apnea    mild-does not use cpap  . Takotsubo cardiomyopathy    a. 12/2015 - nephew committed suicide 1 week prior, sister died the morning of presentation - initially called a STEMI; cath with patent stents. LVEF 25-30%.  . Tendonitis of left rotator cuff   . Tobacco abuse   . Vascular dementia     Past Surgical History:  Procedure Laterality Date  . ABDOMINAL AORTOGRAM W/LOWER EXTREMITY N/A 09/12/2020   Procedure: ABDOMINAL AORTOGRAM W/LOWER EXTREMITY;  Surgeon: Wellington Hampshire, MD;  Location: [place] [facility];  Service: Cardiovascular;  Laterality: N/A;  . ABDOMINAL HYSTERECTOMY    . APPENDECTOMY    . BACK SURGERY    . CARDIAC CATHETERIZATION  2013  . CARDIAC CATHETERIZATION  2016  . CARDIAC CATHETERIZATION N/A 01/01/2016   Procedure: Left Heart Cath and Coronary Angiography;  Surgeon: Jettie Booze, MD;  Location: [place] [facility];  Service: Cardiovascular;  Laterality: N/A;  . CATARACT EXTRACTION W/PHACO Left 01/30/2015   Procedure: CATARACT EXTRACTION PHACO AND INTRAOCULAR LENS PLACEMENT (IOC);  Surgeon:  Birder Robson, MD;  Location: [facility];  Service: Ophthalmology;  Laterality: Left;  [place] 00:47   . CATARACT EXTRACTION W/PHACO Right 02/13/2015   Procedure: CATARACT EXTRACTION PHACO AND INTRAOCULAR LENS PLACEMENT (IOC);  Surgeon: Birder Robson, MD;  Location: [facility];  Service: Ophthalmology;  Laterality: Right;  cassette lot # [id number] H [place]  00:29.9 AP  20.7 CDE  6.20  . COLONOSCOPY N/A 11/02/2014   Procedure: COLONOSCOPY;  Surgeon: Inda Castle, MD;  Location: [facility];  Service: Endoscopy;  Laterality: N/A;  .  CORONARY ANGIOPLASTY  2012   stent x 3   . CORONARY ANGIOPLASTY WITH STENT PLACEMENT  2013  . ESOPHAGOGASTRODUODENOSCOPY (EGD) WITH PROPOFOL N/A 09/21/2018   Procedure: ESOPHAGOGASTRODUODENOSCOPY (EGD) WITH PROPOFOL;  Surgeon: [name], MD;  Location: [facility];  Service: Gastroenterology;  Laterality: N/A;  . PERIPHERAL VASCULAR ATHERECTOMY Left 09/12/2020   Procedure: PERIPHERAL VASCULAR ATHERECTOMY;  Surgeon: Wellington Hampshire, MD;  Location: [place] [facility];  Service: Cardiovascular;  Laterality: Left;  . PERIPHERAL VASCULAR BALLOON ANGIOPLASTY  09/12/2020   Procedure: PERIPHERAL VASCULAR BALLOON ANGIOPLASTY;  Surgeon: Wellington Hampshire, MD;  Location: [place] [facility];  Service: Cardiovascular;;  . SHOULDER SURGERY Left 2017     Current Outpatient Medications  Medication Sig Dispense Refill  . albuterol (VENTOLIN HFA) 108 (90 Base) MCG/ACT inhaler Inhale 2 puffs into the lungs every 6 (six) hours as needed for wheezing or shortness of breath. 8 g 0  . carvedilol (COREG) 6.25 MG tablet Take 1 tablet (6.25 mg total) by mouth 2 (two) times daily. 180 tablet 3  . clopidogrel (PLAVIX) 75 MG tablet Take 1 tablet (75 mg total) by mouth daily. 90 tablet 3  . ezetimibe (ZETIA) 10 MG tablet Take 1 tablet (10 mg total) by mouth daily. For cholesterol. 90 tablet 3  . furosemide (LASIX) 20 MG tablet Take 1 tablet (20 mg total) by mouth daily as needed for edema. 30 tablet 2  . gabapentin (NEURONTIN) 300 MG capsule Take 1 capsule (300 mg total) by mouth 3 (three) times daily. 90 capsule 5  . glucose blood (FREESTYLE LITE) test strip Test up to four times a day as needed. DX 100 each 12  . insulin glargine (LANTUS SOLOSTAR) 100 UNIT/ML Solostar Pen Inject 30 Units into the skin 2 (two) times daily. 30 mL 3  . Insulin Pen Needle (INSUPEN PEN NEEDLES) 32G X 4 MM MISC Use to inject insulin 4 times daily. 250 each 1  . isosorbide mononitrate (IMDUR) 30 MG 24 hr tablet Take 1 tablet (30 mg total)  by mouth daily. 90 tablet 3  . levothyroxine (SYNTHROID) 100 MCG tablet Take 1 tablet by mouth every morning on an empty stomach with water only.  No food or other medications for 30 minutes. (Patient taking differently: Take 100 mcg by mouth daily before breakfast. on an empty stomach with water only.  No food or other medications for 30 minutes.) 90 tablet 0  . meloxicam (MOBIC) 15 MG tablet Take 1 tablet (15 mg total) by mouth daily. 30 tablet 5  . neomycin-polymyxin b-dexamethasone (MAXITROL) 3.5-10000-0.1 SUSP Place 1 drop into the left eye 3 (three) times daily.    . rosuvastatin (CRESTOR) 40 MG tablet Take 1 tablet (40 mg total) by mouth every evening. For cholesterol. 90 tablet 3  . traZODone (DESYREL) 100 MG tablet TAKE 1 TABLET BY MOUTH AT BEDTIME AS NEEDED FOR SLEEP (Patient taking differently: Take 100 mg by mouth at bedtime as needed for  sleep.) 90 tablet 1   No current facility-administered medications for this visit.    Allergies:   No known allergies    Social History:  The patient  reports that she has been smoking cigarettes. She has a 45.00 pack-year smoking history. She has never used smokeless tobacco. She reports current drug use. Drug: Marijuana. She reports that she does not drink alcohol.   Family History:  The patient's family history includes Colon cancer in her sister; Heart attack in her mother; Heart disease in her father and mother; Liver cancer in her brother; Throat cancer in her brother.    ROS:  Please see the history of present illness.   Otherwise, review of systems are positive for none.   All other systems are reviewed and negative.    PHYSICAL EXAM: VS:  BP (!) 156/84   Pulse 61   Ht 5\' 7"  (1.702 m)   Wt 155 lb (70.3 kg)   BMI 24.28 kg/m  , BMI Body mass index is 24.28 kg/m. GEN: Well nourished, well developed, in no acute distress  HEENT: normal  Neck: no JVD, carotid bruits, or masses Cardiac: RRR; no murmurs, rubs, or gallops,no edema   Respiratory:  clear to auscultation bilaterally, normal work of breathing GI: soft, nontender, nondistended, + BS MS: no deformity or atrophy  Skin: warm and dry, no rash Neuro:  Strength and sensation are intact Psych: euthymic mood, full affect Vascular: Femoral pulses normal bilaterally.  No groin hematoma.  She has palpable posterior tibial pulse on the left.   EKG:  EKG is not ordered today.    Recent Labs: 08/03/2020: ALT 22 08/30/2020: BUN 17; Creatinine, Ser 1.15; Hemoglobin 13.8; Platelets 242; Potassium 4.2; Sodium 138 09/07/2020: TSH 5.36    Lipid Panel    Component Value Date/Time   CHOL 102 08/03/2020 1005   CHOL 200 (H) 08/29/2019 0947   CHOL 292 (H) 08/02/2014 0408   TRIG 171.0 (H) 08/03/2020 1005   TRIG 388 (H) 08/02/2014 0408   HDL 38.70 (L) 08/03/2020 1005   HDL 34 (L) 08/29/2019 0947   HDL 34 (L) 08/02/2014 0408   CHOLHDL 3 08/03/2020 1005   VLDL 34.2 08/03/2020 1005   VLDL 78 (H) 08/02/2014 0408   LDLCALC 29 08/03/2020 1005   LDLCALC 131 (H) 08/29/2019 0947   LDLCALC 180 (H) 08/02/2014 0408   LDLDIRECT 143.0 12/24/2018 1227      Wt Readings from Last 3 Encounters:  09/27/20 155 lb (70.3 kg)  09/12/20 157 lb (71.2 kg)  09/10/20 156 lb 8 oz (71 kg)        No flowsheet data found.    ASSESSMENT AND PLAN:  1. Peripheral arterial disease: Status post recent orbital atherectomy and drug-coated balloon angioplasty to the left SFA with excellent results.  Complete resolution of left calf claudication.  She is already on dual antiplatelet therapy with aspirin and clopidogrel.  Repeat vascular studies in 6 months.  2. Coronary artery disease involving native coronary arteries: No anginal symptoms at the present time.  3. Hyperlipidemia: Continue treatment with rosuvastatin and Zetia.  Most recent lipid profile showed an LDL of 29.  4. Chronic systolic heart failure: She appears to be euvolemic  5. Essential hypertension: Blood pressure is mildly  elevated.  Might need to consider another antihypertensive medication such as amlodipine or a thiazide diuretic.  Not able to increase carvedilol given baseline bradycardia.  6. Tobacco use: Smoking cessation is advised    Disposition:   FU  with me in 6 months  Signed,  Kathlyn Sacramento, MD  09/27/2020 9:46 AM    [place]

## 2020-09-27 NOTE — Patient Instructions (Signed)
Dear Sarah Payne,  Below is a summary of the goals we discussed during our follow up appointment on September 27, 2020. Please contact me anytime with questions or concerns.    Visit Information  Goals:   - Schedule diabetic eye exam  - Continue monitoring before meals and bedtime   - Call if weight gain > 3 lbs in 24 hours or > 5 lbs in 1 week   Patient Care Plan: CCM Pharmacy Care Plan    Problem Identified: CHL AMB "PATIENT-SPECIFIC PROBLEM"     Long-Range Goal: Disease Management   Start Date: 09/26/2020  Priority: High  Note:    Current Barriers:  . Unable to achieve control of diabetes and blood pressure   Pharmacist Clinical Goal(s):  Marland Kitchen Over the next 90 days, patient will achieve control of diabetes  as evidenced by home BG log through collaboration with PharmD and provider.   Interventions: . 1:1 collaboration with [name], NP regarding development and update of comprehensive plan of care as evidenced by provider attestation and co-signature . Inter-disciplinary care team collaboration (see longitudinal plan of care) . Comprehensive medication review performed; medication list updated in electronic medical record  Hypertension (BP goal <130/80) -Follows with cardiology, reports she has been seen once a week for the past month and BP controlled Query Controlled -Current treatment: . Enalapril 20 mg - 1 tablet daily . Carvedilol 6.$RemoveBefore'25mg'[id number]$  - 1 BID . Isosorbide mononitrate 30 mg - 1 tablet daily -Medications previously tried: none  -Current home readings: not monitoring BP right now, focusing on sugars -Denies hypotensive/hypertensive symptoms -Educated on BP goals and benefits of medications for prevention of heart attack, stroke and kidney damage; -Counseled to monitor BP at home with symptoms, document, and provide log at future appointments -Recommended to continue current medication; Keep close eye on BP. Follow up call in 3 weeks for BP  check.  Hyperlipidemia: (LDL goal < 70) -Controlled -Current treatment: . Rosuvastatin 40 mg - 1 tablet daily . Zetia 10 mg - 1 tablet daily  -Medications previously tried: none -Recommended to continue current medication  Diabetes (A1c goal <7%) -Uncontrolled -Current medications: . Novolog flexpen - 25 units TID before meals (pt taking 30 units TID) . Lantus solostar - 30 units BID (taking 60 units at night) -Medications previously tried: none -Current home glucose readings: reports checking 5-6 times daily before meals, after meals, bedtime  She ran out of sheets to keep sugar on, using a book now.  Reports on Monday night she changed the Lantus back to once daily because fasting readings were in 200s.  . Fasting glucose: 116 yesterday morning, 120 this morning . Post prandial glucose: 120s or lower the rest of the day o Lowest - 58 the other day, drank bottle of orange juice, came back up to above 70 (reports she may have missed breakfast this day), denies other lows -Current meal patterns: denies any dietary changes, limits potatoes, sweets -Current exercise: walking more, taking dog and walking down street and back about 2-3 times per week -Educated on Prevention and management of hypoglycemic episodes; -Counseled to check feet daily and get yearly eye exams -Recommended to continue current medication; Schedule eye exam. PCP visit next month.  Heart Failure (Goal: manage symptoms and prevent exacerbations) -Controlled -Last ejection fraction: 50-55% (Date: 10/21) -HF type: Systolic  -Last cardio follow up, 08/29/19 -Current treatment: . Lasix 20 mg - 1 tablet daily PRN . Enalapril 20 mg - 1 tablet daily . Carvedilol  6.84m - 1 BID -Medications previously tried: none   -Denies any swelling or SOB. Has not used fluid pill.  -Educated on Importance of weighing daily; if you gain more than 3 pounds in one day or 5 pounds in one week, take Lasix -Recommended to continue current  medication  Recent medication changes: -Increased levothyroxine to 100 mcg on Monday 09/24/20 per patient report  Patient Goals/Self-Care Activities . Over the next 30 days, patient will:  - check glucose before meals and bedtime, document, and provide at future appointments weigh daily, and contact provider if weight gain of 3 pounds in 24 hours or 5 pounds in 7 days  -schedule diabetic eye exam    Follow Up Plan: Next PCP appointment scheduled for:  November 06, 2020 CMA call in 3 weeks to check BP at home. See if patient has scheduled eye exam. Check on BG log.  Medication Assistance: None required.  Patient affirms current coverage meets needs.      The patient verbalized understanding of instructions, educational materials, and care plan provided today and agreed to receive a mailed copy of patient instructions, educational materials, and care plan.  The pharmacy team will reach out to the patient again over the next 30 days.   MDebbora Dus PharmD Clinical Pharmacist LY[facility]Primary [facility] at [phone] Preventing Hypoglycemia Hypoglycemia occurs when the level of sugar (glucose) in the blood is too low. Hypoglycemia can happen in people who do or do not have diabetes (diabetes mellitus). It can develop quickly, and it can be a medical emergency. For most people with diabetes, a blood glucose level below 70 mg/dL (3.9 mmol/L) is considered hypoglycemia. Glucose is a type of sugar that provides the body's main source of energy. Certain hormones (insulin and glucagon) control the level of glucose in the blood. Insulin lowers blood glucose, and glucagon increases blood glucose. Hypoglycemia can result from having too much insulin in the bloodstream, or from not eating enough food that contains glucose. Your risk for hypoglycemia is higher:  If you take insulin or diabetes medicines to help lower your blood glucose or help your body make more insulin.  If you skip or  delay a meal or snack.  If you are ill.  During and after exercise. You can prevent hypoglycemia by working with your health care provider to adjust your meal plan as needed and by taking other precautions. How can hypoglycemia affect me? Mild symptoms Mild hypoglycemia may not cause any symptoms. If you do have symptoms, they may include:  Hunger.  Anxiety.  Sweating and feeling clammy.  Dizziness or feeling light-headed.  Sleepiness.  Nausea.  Increased heart rate.  Headache.  Blurry vision.  Irritability.  Tingling or numbness around the mouth, lips, or tongue.  A change in coordination.  Restless sleep. If mild hypoglycemia is not recognized and treated, it can quickly become moderate or severe hypoglycemia. Moderate symptoms Moderate hypoglycemia can cause:  Mental confusion and poor judgment.  Behavior changes.  Weakness.  Irregular heartbeat. Severe symptoms Severe hypoglycemia is a medical emergency. It can cause:  Fainting.  Seizures.  Loss of consciousness (coma).  Death. What nutrition changes can be made?  Work with your health care provider or diet and nutrition specialist (dietitian) to make a healthy meal plan that is right for you. Follow your meal plan carefully.  Eat meals at regular times.  If recommended by your health care provider, have snacks between meals.  Donot skip or delay meals  or snacks. You can be at risk for hypoglycemia if you are not getting enough carbohydrates. What lifestyle changes can be made?  Work closely with your health care provider to manage your blood glucose. Make sure you know: ? Your goal blood glucose levels. ? How and when to check your blood glucose. ? The symptoms of hypoglycemia. It is important to treat it right away to keep it from becoming severe.  Do not drink alcohol on an empty stomach.  When you are ill, check your blood glucose more often than usual. Follow your sick day plan  whenever you cannot eat or drink normally. Make this plan in advance with your health care provider.  Always check your blood glucose before, during, and after exercise.   How is this treated? This condition can often be treated by immediately eating or drinking something that contains sugar, such as:  Fruit juice, 4-6 oz (120-150 mL).  Regular (not diet) soda, 4-6 oz (120-150 mL).  Low-fat milk, 4 oz (120 mL).  Several pieces of hard candy.  Sugar or honey, 1 Tbsp (15 mL). Treating hypoglycemia if you have diabetes If you are alert and able to swallow safely, follow the 15:15 rule:  Take 15 grams of a rapid-acting carbohydrate. Talk with your health care provider about how much you should take.  Rapid-acting options include: ? Glucose pills (take 15 grams). ? 6-8 pieces of hard candy. ? 4-6 oz (120-150 mL) of fruit juice. ? 4-6 oz (120-150 mL) of regular (not diet) soda.  Check your blood glucose 15 minutes after you take the carbohydrate.  If the repeat blood glucose level is still at or below 70 mg/dL (3.9 mmol/L), take 15 grams of a carbohydrate again.  If your blood glucose level does not increase above 70 mg/dL (3.9 mmol/L) after 3 tries, seek emergency medical care.  After your blood glucose level returns to normal, eat a meal or a snack within 1 hour. Treating severe hypoglycemia Severe hypoglycemia is when your blood glucose level is at or below 54 mg/dL (3 mmol/L). Severe hypoglycemia is a medical emergency. Get medical help right away. If you have severe hypoglycemia and you cannot eat or drink, you may need an injection of glucagon. A family member or close friend should learn how to check your blood glucose and how to give you a glucagon injection. Ask your health care provider if you need to have an emergency glucagon injection kit available. Severe hypoglycemia may need to be treated in a hospital. The treatment may include getting glucose through an IV. You may  also need treatment for the cause of your hypoglycemia. Where to find more information  American Diabetes Association: www.diabetes.[employer] of Diabetes and Digestive and Kidney Diseases: [employer] Contact a health care provider if:  You have problems keeping your blood glucose in your target range.  You have frequent episodes of hypoglycemia. Get help right away if:  You continue to have hypoglycemia symptoms after eating or drinking something containing glucose.  Your blood glucose level is at or below 54 mg/dL (3 mmol/L).  You faint.  You have a seizure. These symptoms may represent a serious problem that is an emergency. Do not wait to see if the symptoms will go away. Get medical help right away. Call your local emergency services (911 in the [place] Summary  Know the symptoms of hypoglycemia, and when you are at risk for it (such as during exercise or when you are sick). Check  your blood glucose often when you are at risk for hypoglycemia.  Hypoglycemia can develop quickly, and it can be dangerous if it is not treated right away. If you have a history of severe hypoglycemia, make sure you know how to use your glucagon injection kit.  Make sure you know how to treat hypoglycemia. Keep a carbohydrate snack available when you may be at risk for hypoglycemia. This information is not intended to replace advice given to you by your health care provider. Make sure you discuss any questions you have with your health care provider. Document Revised: 10/29/2018 Document Reviewed: 03/04/2017 Elsevier Patient Education  2021 [employer].

## 2020-09-27 NOTE — Patient Instructions (Signed)

## 2020-09-27 NOTE — Progress Notes (Signed)
I have personally reviewed this encounter including the documentation in this note and have collaborated with the care management provider regarding care management and care coordination activities to include development and update of the comprehensive care plan. I am certifying that I agree with the content of this note and encounter as supervising physician.    

## 2020-10-03 ENCOUNTER — Other Ambulatory Visit: Payer: Self-pay | Admitting: Primary Care

## 2020-10-03 DIAGNOSIS — G4700 Insomnia, unspecified: Secondary | ICD-10-CM

## 2020-10-10 DIAGNOSIS — E119 Type 2 diabetes mellitus without complications: Secondary | ICD-10-CM | POA: Diagnosis not present

## 2020-10-10 LAB — HM DIABETES EYE EXAM

## 2020-10-18 ENCOUNTER — Encounter: Payer: Self-pay | Admitting: Primary Care

## 2020-10-30 ENCOUNTER — Telehealth: Payer: Self-pay

## 2020-10-30 ENCOUNTER — Other Ambulatory Visit: Payer: Self-pay

## 2020-10-30 MED ORDER — FREESTYLE LITE TEST VI STRP: ORAL_STRIP | 0 refills | Status: DC

## 2020-10-30 NOTE — Telephone Encounter (Signed)
Refilled called in see myChart

## 2020-10-30 NOTE — Telephone Encounter (Signed)
Patient called and needs her Freestyle Lite test strips sent to [facility] on [address] so that she can get a refill today.

## 2020-10-31 ENCOUNTER — Telehealth: Payer: Self-pay

## 2020-10-31 NOTE — Progress Notes (Addendum)
Chronic Care Management Pharmacy Assistant   Name: Sarah Payne  MRN: [id number] DOB: 12-04-1945  Reason for Encounter: Disease State/ Hypertension, Diabetes   Conditions to be addressed/monitored: HTN and DMII  Recent office visits:  No recent office visits  Recent consult visits: No recent consult visits  Hospital visits:  No recent hospital visits  Medications: Outpatient Encounter Medications as of 10/31/2020  Medication Sig   albuterol (VENTOLIN HFA) 108 (90 Base) MCG/ACT inhaler Inhale 2 puffs into the lungs every 6 (six) hours as needed for wheezing or shortness of breath.   amitriptyline (ELAVIL) 50 MG tablet Take 50 mg by mouth at bedtime as needed for sleep.   carvedilol (COREG) 6.25 MG tablet Take 1 tablet (6.25 mg total) by mouth 2 (two) times daily.   clopidogrel (PLAVIX) 75 MG tablet Take 1 tablet (75 mg total) by mouth daily.   enalapril (VASOTEC) 20 MG tablet Take 20 mg by mouth daily.   ezetimibe (ZETIA) 10 MG tablet Take 1 tablet (10 mg total) by mouth daily. For cholesterol.   furosemide (LASIX) 20 MG tablet Take 1 tablet (20 mg total) by mouth daily as needed for edema.   gabapentin (NEURONTIN) 300 MG capsule Take 1 capsule (300 mg total) by mouth 3 (three) times daily.   glucose blood (FREESTYLE LITE) test strip Test up to four times a day as needed. DX   insulin aspart (NOVOLOG FLEXPEN) 100 UNIT/ML FlexPen Inject 22 Units into the skin 3 (three) times daily with meals.   insulin glargine (LANTUS SOLOSTAR) 100 UNIT/ML Solostar Pen Inject 30 Units into the skin 2 (two) times daily.   Insulin Pen Needle (INSUPEN PEN NEEDLES) 32G X 4 MM MISC Use to inject insulin 4 times daily.   isosorbide mononitrate (IMDUR) 30 MG 24 hr tablet Take 1 tablet (30 mg total) by mouth daily.   levothyroxine (SYNTHROID) 100 MCG tablet Take 100 mcg by mouth daily before breakfast. On an empty stomach with water only. No food or other medications for 30 minutes.   meloxicam (MOBIC)  15 MG tablet Take 1 tablet (15 mg total) by mouth daily.   neomycin-polymyxin b-dexamethasone (MAXITROL) 3.5-10000-0.1 SUSP Place 1 drop into the left eye 3 (three) times daily.   rosuvastatin (CRESTOR) 40 MG tablet Take 1 tablet (40 mg total) by mouth every evening. For cholesterol.   traZODone (DESYREL) 100 MG tablet TAKE 1 TABLET BY MOUTH AT BEDTIME AS NEEDED FOR SLEEP   No facility-administered encounter medications on file as of 10/31/2020.   Reviewed chart prior to disease state call. Spoke with patient regarding BP  Recent Office Vitals: BP Readings from Last 3 Encounters:  09/27/20 (!) 156/84  09/12/20 (!) 160/72  09/10/20 (!) 142/84   Pulse Readings from Last 3 Encounters:  09/27/20 61  09/12/20 (!) 58  09/10/20 75    Wt Readings from Last 3 Encounters:  09/27/20 155 lb (70.3 kg)  09/12/20 157 lb (71.2 kg)  09/10/20 156 lb 8 oz (71 kg)       Kidney Function Lab Results  Component Value Date/Time   CREATININE 1.15 (H) 08/30/2020 09:41 AM   CREATININE 1.21 (H) 08/03/2020 10:05 AM   CREATININE 1.06 (H) 04/04/2016 05:21 PM   CREATININE 1.24 08/02/2014 04:08 AM   CREATININE 1.33 (H) 08/01/2014 12:53 PM   CREATININE 1.07 11/24/2011 03:41 PM   GFR 44.80 (L) 08/03/2020 10:05 AM   GFRNONAA 48 (L) 08/30/2020 09:41 AM   GFRNONAA 46 (L) 08/02/2014 04:08 AM  GFRNONAA 47 (L) 08/20/2013 05:07 AM   GFRNONAA 55 (L) 11/24/2011 03:41 PM   GFRAA 55 (L) 08/30/2020 09:41 AM   GFRAA 56 (L) 08/02/2014 04:08 AM   GFRAA 55 (L) 08/20/2013 05:07 AM   GFRAA 64 11/24/2011 03:41 PM    BMP Latest Ref Rng & Units 08/30/2020 08/03/2020 06/04/2020  Glucose 65 - 99 mg/dL 215(H) 210(H) 162(H)  BUN 8 - 27 mg/dL 17 27(H) 22  Creatinine 0.57 - 1.00 mg/dL 1.15(H) 1.21(H) 1.27(H)  BUN/Creat Ratio 12 - 28 15 - -  Sodium 134 - 144 mmol/L 138 137 135  Potassium 3.5 - 5.2 mmol/L 4.2 4.6 4.3  Chloride 96 - 106 mmol/L 102 100 97  CO2 20 - 29 mmol/L 23 29 31   Calcium 8.7 - 10.3 mg/dL 9.0 9.5 9.7    Multiple attempts made to reach patient starting on 10/31/20. Was able to connect with patient 11/06/20.  Current antihypertensive regimen:  Enalapril 20mg  take one tablet daily Carvedilol 6.25mg  take one tablet twice daily  Patient verbally confirms she is taking the above medications as directed. Yes Denies missing any doses. States she just received refill of all of her medications from [facility]  How often are you checking your Blood Pressure? several times per month  she checks her blood pressure in the morning after taking her medication.  Current home BP readings:   DATE:             BP               PULSE  -  159/91  -  -  229/102 -  -  154/52  -  11/06/20 192/109- patient checked while on the phone with me 11/06/20.  Wrist or arm cuff: Arm Caffeine intake: Yes Salt intake: Tries to limit OTC medications including pseudoephedrine or NSAIDs?  Any readings above 180/120? Yes- 229/102 - 10/25/20 If yes any symptoms of hypertensive emergency? patient denies any symptoms of high blood pressure other than chest pain while at the hospital after husbands heart attack. States nurse checked BP and checked her out.  What recent interventions/DTPs have been made by any provider to improve Blood Pressure control since last CPP Visit:  Educated on Importance of weighing daily; if you gain more than 3 pounds in one day or 5 pounds in one week, take Lasix  Any recent hospitalizations or ED visits since last visit with CPP? No  What diet changes have been made to improve Blood Pressure Control?  Diet has not been as consistent due to husband's recent heart attack and open heart surgery.   What exercise is being done to improve your Blood Pressure Control?  None at this time.  Adherence Review: Is the patient currently on ACE/ARB medication? Yes Does the patient have >5 day gap between last estimated fill dates? CPP to review   Recent Relevant Labs: Lab Results  Component Value  Date/Time   HGBA1C 10.4 (A) 08/03/2020 09:17 AM   HGBA1C 10.3 (A) 05/03/2020 11:14 AM   HGBA1C 10.3 (H) 12/24/2018 12:27 PM   HGBA1C 10.4 (H) 09/16/2016 11:53 AM   HGBA1C 10.9 (H) 08/02/2014 04:08 AM   HGBA1C 7.3 (H) 08/19/2013 12:19 PM   MICROALBUR 1.2 05/30/2013 04:48 PM    Kidney Function Lab Results  Component Value Date/Time   CREATININE 1.15 (H) 08/30/2020 09:41 AM   CREATININE 1.21 (H) 08/03/2020 10:05 AM   CREATININE 1.06 (H) 04/04/2016 05:21 PM   CREATININE 1.24 08/02/2014 04:08 AM  CREATININE 1.33 (H) 08/01/2014 12:53 PM   CREATININE 1.07 11/24/2011 03:41 PM   GFR 44.80 (L) 08/03/2020 10:05 AM   GFRNONAA 48 (L) 08/30/2020 09:41 AM   GFRNONAA 46 (L) 08/02/2014 04:08 AM   GFRNONAA 47 (L) 08/20/2013 05:07 AM   GFRNONAA 55 (L) 11/24/2011 03:41 PM   GFRAA 55 (L) 08/30/2020 09:41 AM   GFRAA 56 (L) 08/02/2014 04:08 AM   GFRAA 55 (L) 08/20/2013 05:07 AM   GFRAA 64 11/24/2011 03:41 PM    Current antihyperglycemic regimen:  Novolog 100 units inject 22 units into skin three times daily with meals. Lantus 100 units inject 30 units into skin twice daily. (States she injects 60 units at night only- not twice a day)  What recent interventions/DTPs have been made to improve glycemic control:  None from providers. Patient decided to not inject 30 units of Lantus twice daily but to inject 60 units at bedtime.   Have there been any recent hospitalizations or ED visits since last visit with CPP? No   Patient reports hypoglycemic symptoms, including Shaky and Nervous/irritable when BG is below 70.   Patient denies hyperglycemic symptoms, including blurry vision, excessive thirst, fatigue, polyuria and weakness   How often are you checking your blood sugar? twice daily   What are your blood sugars ranging?  Fasting: 124, 49, 62 179, 140, 138, 52, 194, 67, 113, 67, 97, 187- states she forgot to take insulin day before due to being at hospital with husband.  Before meals: N/A After  meals: 129, 67 Bedtime: N/A  During the week, how often does your blood glucose drop below 70? Twice a week or more.    Are you checking your feet daily/regularly? Yes. Denies wounds or sores.  Adherence Review: Is the patient currently on a STATIN medication? Yes Is the patient currently on ACE/ARB medication? Yes Does the patient have >5 day gap between last estimated fill dates? CPP to review  Star Rating Drugs: Rosuvastatin 40mg  ( No fill History) Enalapril 20mg  11-25-2019 90DS- states it just got refilled from [facility]   Patient states husband recently had heart attack and has undergone open heart surgery. She is aware that her blood pressure is not ideal and can be quite dangerous. Patient inquired about PCP appointment during call. Explained to her she missed the appointment this morning. Patient stated she has not been home and has had a lot on her mind. Message sent to clinic to contact patient to reschedule.    Follow-Up:  Pharmacist Review  [name], CPP notified  Margaretmary Dys, [place] Assistant [phone]  I have reviewed the care management and care coordination activities outlined in this encounter and I am certifying that I agree with the content of this note. Agree - Re-schedule PCP visit.   [name], PharmD Clinical Pharmacist [facility] at [facility] [phone]

## 2020-11-06 ENCOUNTER — Ambulatory Visit: Payer: Medicare PPO | Admitting: Primary Care

## 2020-11-06 DIAGNOSIS — Z0289 Encounter for other administrative examinations: Secondary | ICD-10-CM

## 2020-11-07 ENCOUNTER — Telehealth: Payer: Self-pay

## 2020-11-07 NOTE — Telephone Encounter (Signed)
Can you call to make appointment  

## 2020-11-07 NOTE — Telephone Encounter (Signed)
-----   Message from Anthony sent at 11/06/2020  9:49 AM EDT ----- Regarding: Appt Contacted patient today to review BP/BG she inquired about her next appointment. Informed her that it was supposed to be today at 9:20. She states she forgot. Her husband had open heart surgery following a heart attack about 10-14 days ago. She asked if someone would call to reschedule.  Thank you, Ria Comment

## 2020-11-08 NOTE — Telephone Encounter (Signed)
Pt r/s for 4/27

## 2020-11-14 ENCOUNTER — Encounter: Payer: Self-pay | Admitting: Primary Care

## 2020-11-14 ENCOUNTER — Other Ambulatory Visit: Payer: Self-pay

## 2020-11-14 ENCOUNTER — Ambulatory Visit (INDEPENDENT_AMBULATORY_CARE_PROVIDER_SITE_OTHER): Payer: Medicare PPO | Admitting: Primary Care

## 2020-11-14 VITALS — BP 134/78 | HR 77 | Temp 98.6°F | Ht 67.0 in | Wt 153.0 lb

## 2020-11-14 DIAGNOSIS — E039 Hypothyroidism, unspecified: Secondary | ICD-10-CM

## 2020-11-14 DIAGNOSIS — E1151 Type 2 diabetes mellitus with diabetic peripheral angiopathy without gangrene: Secondary | ICD-10-CM

## 2020-11-14 LAB — POCT GLYCOSYLATED HEMOGLOBIN (HGB A1C): Hemoglobin A1C: 8.5 % — AB (ref 4.0–5.6)

## 2020-11-14 LAB — TSH: TSH: 0.21 u[IU]/mL — ABNORMAL LOW (ref 0.35–4.50)

## 2020-11-14 LAB — GLUCOSE, POCT (MANUAL RESULT ENTRY): POC Glucose: 153 mg/dl — AB (ref 70–99)

## 2020-11-14 MED ORDER — NOVOLOG FLEXPEN 100 UNIT/ML ~~LOC~~ SOPN: PEN_INJECTOR | SUBCUTANEOUS | 3 refills | Status: DC

## 2020-11-14 NOTE — Assessment & Plan Note (Signed)
She appears to be taking levothyroxine correct. Repeat TSH pending. Continue levothyroxine 100 mg.

## 2020-11-14 NOTE — Patient Instructions (Signed)
Stop by the lab prior to leaving today. I will notify you of your results once received.   For the Novolog (meal insulin):  Inject 22 units with breakfast and lunch Inject 14 units with dinner  Continue Lantus 60 units at bedtime  Please call me if you continue to notice drops in your blood sugar below 80.  Please schedule a follow up appointment in 3 months for a physical.  It was a pleasure to see you today!

## 2020-11-14 NOTE — Assessment & Plan Note (Signed)
Improved with A1C today of 8.5! Better but not at goal, she really needs to be below 7, but given her frailty below 8 would be acceptable.  Given drops in glucose readings in the evening we will reduce her dinner time Novolog to 14 units, continue 22 units at breakfast and lunch. Continue Lantus 60 units HS.  We will plan to see her back in 3 months. She will report back sooner if she continues to notice episodes of hypoglycemia.

## 2020-11-14 NOTE — Progress Notes (Signed)
Subjective:    Patient ID: Sarah Payne, female    DOB: [birth year], 75 y.o.   MRN: [id number]  HPI  Sarah Payne is a very pleasant 75 y.o. female with a history of uncontrolled diabetes, CAD, CHF, hypertension, cardiomyopathy, COPD, OSA, hypothyroidism who presents today for diabetes follow up and repeat TSH.  1) Type 2 Diabetes:  Current medications include: Lantus 30 units BID, Novolog 22 units TID with meals. She is actually injecting Lantus 60 units   She is checking her blood glucose 4-5 times daily and is getting readings of 40's-160's. She is dropping in the 40's and 50's before dinner and during the night.   Last A1C: 10.4 in January 2022, 8.5 today Last Eye Exam: UTD Last Foot Exam: UTD Pneumonia Vaccination: 2020 ACE/ARB: Enalapril  Statin: Crestor  2) Hypothyroidism: Currently managed on levothyroxine 100 mcg. Last TSH of 5.36 in February 2022. She is taking her levothyroxine every morning on an empty stomach with water only, no food or other medications for 30 minutes. No heartburn medications or vitamins within 4 hours.    BP Readings from Last 3 Encounters:  11/14/20 134/78  09/27/20 (!) 156/84  09/12/20 (!) 160/72        Review of Systems  Eyes: Negative for visual disturbance.  Respiratory: Negative for shortness of breath.   Cardiovascular: Negative for chest pain.  Neurological: Negative for dizziness.         Past Medical History:  Diagnosis Date  . Acute systolic (congestive) heart failure ([place]) 01/01/2016  . Cataract    BILATERAL REMOVED  . Chronic low back pain   . COPD (chronic obstructive pulmonary disease) ([place])   . Coronary artery disease    a. multiple PCIs to the mLCx. b. stent to dRCA 08/2011 in setting of NSTEMI. c. DES to prox-mid LCx for ISR 09/2011. d.  DESx2 to prox-mRCA 07/2012. e. Takotsubo event 12/2015 with patent stents.  . Depression   . Diabetes mellitus   . Diverticulosis   . Frequent PVCs    a. Noted in hospital  12/2015.  Marland Kitchen GERD (gastroesophageal reflux disease)   . Hyperlipidemia   . Hypertension   . Hypothyroidism   . Impingement syndrome of left shoulder   . Marijuana abuse   . Myocardial infarction ([place])    x 5  . Sleep apnea    mild-does not use cpap  . Takotsubo cardiomyopathy    a. 12/2015 - nephew committed suicide 1 week prior, sister died the morning of presentation - initially called a STEMI; cath with patent stents. LVEF 25-30%.  . Tendonitis of left rotator cuff   . Tobacco abuse   . Vascular dementia     Social History   Socioeconomic History  . Marital status: Married    Spouse name: Not on file  . Number of children: Not on file  . Years of education: Not on file  . Highest education level: Not on file  Occupational History  . Not on file  Tobacco Use  . Smoking status: Current Every Day Smoker    Packs/day: 1.00    Years: 45.00    Pack years: 45.00    Types: Cigarettes  . Smokeless tobacco: Never Used  . Tobacco comment: Has cut back, trying to quit.   Vaping Use  . Vaping Use: Never used  Substance and Sexual Activity  . Alcohol use: No    Alcohol/week: 0.0 standard drinks  . Drug use: Yes  Types: Marijuana    Comment: last noc  . Sexual activity: Not on file  Other Topics Concern  . Not on file  Social History Narrative   Lives at home with her husband in [place].  Previously used marijuana - quit.      Regular exercise: no/ pain from a frozen rotator cuff   Caffeine use: coffee daily and pepsi      Does not have a living will.   Daughters and husband know her wishes- would desire CPR but not prolonged life support if futile   Social Determinants of Health   Financial Resource Strain: Low Risk   . Difficulty of Paying Living Expenses: Not very hard  Food Insecurity: Not on file  Transportation Needs: Not on file  Physical Activity: Not on file  Stress: Not on file  Social Connections: Not on file  Intimate Partner Violence: Not on file     Past Surgical History:  Procedure Laterality Date  . ABDOMINAL AORTOGRAM W/LOWER EXTREMITY N/A 09/12/2020   Procedure: ABDOMINAL AORTOGRAM W/LOWER EXTREMITY;  Surgeon: Wellington Hampshire, MD;  Location: [facility];  Service: Cardiovascular;  Laterality: N/A;  . ABDOMINAL HYSTERECTOMY    . APPENDECTOMY    . BACK SURGERY    . CARDIAC CATHETERIZATION  2013  . CARDIAC CATHETERIZATION  2016  . CARDIAC CATHETERIZATION N/A 01/01/2016   Procedure: Left Heart Cath and Coronary Angiography;  Surgeon: Jettie Booze, MD;  Location: [facility];  Service: Cardiovascular;  Laterality: N/A;  . CATARACT EXTRACTION W/PHACO Left 01/30/2015   Procedure: CATARACT EXTRACTION PHACO AND INTRAOCULAR LENS PLACEMENT (IOC);  Surgeon: Birder Robson, MD;  Location: [facility];  Service: Ophthalmology;  Laterality: Left;  [place] 00:47   . CATARACT EXTRACTION W/PHACO Right 02/13/2015   Procedure: CATARACT EXTRACTION PHACO AND INTRAOCULAR LENS PLACEMENT (IOC);  Surgeon: Birder Robson, MD;  Location: [facility];  Service: Ophthalmology;  Laterality: Right;  cassette lot # [id number] H [place]  00:29.9 AP  20.7 CDE  6.20  . COLONOSCOPY N/A 11/02/2014   Procedure: COLONOSCOPY;  Surgeon: Inda Castle, MD;  Location: [facility];  Service: Endoscopy;  Laterality: N/A;  . CORONARY ANGIOPLASTY  2012   stent x 3   . CORONARY ANGIOPLASTY WITH STENT PLACEMENT  2013  . ESOPHAGOGASTRODUODENOSCOPY (EGD) WITH PROPOFOL N/A 09/21/2018   Procedure: ESOPHAGOGASTRODUODENOSCOPY (EGD) WITH PROPOFOL;  Surgeon: [name], MD;  Location: [facility];  Service: Gastroenterology;  Laterality: N/A;  . PERIPHERAL VASCULAR ATHERECTOMY Left 09/12/2020   Procedure: PERIPHERAL VASCULAR ATHERECTOMY;  Surgeon: Wellington Hampshire, MD;  Location: [facility];  Service: Cardiovascular;  Laterality: Left;  . PERIPHERAL VASCULAR BALLOON ANGIOPLASTY  09/12/2020   Procedure: PERIPHERAL VASCULAR BALLOON ANGIOPLASTY;  Surgeon: Wellington Hampshire, MD;  Location: [facility];  Service: Cardiovascular;;  . SHOULDER SURGERY Left 2017    Family History  Problem Relation Age of Onset  . Heart attack Mother        First MI @ 10 - Died @ 27  . Heart disease Mother   . Heart disease Father        Died @ 70  . Throat cancer Brother   . Liver cancer Brother   . Colon cancer Sister     Allergies  Allergen Reactions  . No Known Allergies     Current Outpatient Medications on File Prior to Visit  Medication Sig Dispense Refill  . albuterol (VENTOLIN HFA) 108 (90 Base) MCG/ACT inhaler Inhale 2  puffs into the lungs every 6 (six) hours as needed for wheezing or shortness of breath. 8 g 0  . amitriptyline (ELAVIL) 50 MG tablet Take 50 mg by mouth at bedtime as needed for sleep.    . carvedilol (COREG) 6.25 MG tablet Take 1 tablet (6.25 mg total) by mouth 2 (two) times daily. 180 tablet 3  . clopidogrel (PLAVIX) 75 MG tablet Take 1 tablet (75 mg total) by mouth daily. 90 tablet 3  . enalapril (VASOTEC) 20 MG tablet Take 20 mg by mouth daily.    Marland Kitchen ezetimibe (ZETIA) 10 MG tablet Take 1 tablet (10 mg total) by mouth daily. For cholesterol. 90 tablet 3  . furosemide (LASIX) 20 MG tablet Take 1 tablet (20 mg total) by mouth daily as needed for edema. 30 tablet 2  . gabapentin (NEURONTIN) 300 MG capsule Take 1 capsule (300 mg total) by mouth 3 (three) times daily. 90 capsule 5  . glucose blood (FREESTYLE LITE) test strip Test up to four times a day as needed. DX 100 each 0  . insulin aspart (NOVOLOG FLEXPEN) 100 UNIT/ML FlexPen Inject 22 Units into the skin 3 (three) times daily with meals.    . insulin glargine (LANTUS SOLOSTAR) 100 UNIT/ML Solostar Pen Inject 30 Units into the skin 2 (two) times daily. (Patient taking differently: Inject 60 Units into the skin at bedtime.) 30 mL 3  . Insulin Pen Needle (INSUPEN PEN NEEDLES) 32G X 4 MM MISC Use to inject insulin 4 times daily. 250 each 1  . isosorbide mononitrate (IMDUR) 30 MG 24 hr  tablet Take 1 tablet (30 mg total) by mouth daily. 90 tablet 3  . levothyroxine (SYNTHROID) 100 MCG tablet Take 100 mcg by mouth daily before breakfast. On an empty stomach with water only. No food or other medications for 30 minutes.    . meloxicam (MOBIC) 15 MG tablet Take 1 tablet (15 mg total) by mouth daily. 30 tablet 5  . neomycin-polymyxin b-dexamethasone (MAXITROL) 3.5-10000-0.1 SUSP Place 1 drop into the left eye 3 (three) times daily.    . rosuvastatin (CRESTOR) 40 MG tablet Take 1 tablet (40 mg total) by mouth every evening. For cholesterol. 90 tablet 3  . traZODone (DESYREL) 100 MG tablet TAKE 1 TABLET BY MOUTH AT BEDTIME AS NEEDED FOR SLEEP 90 tablet 0   No current facility-administered medications on file prior to visit.    BP 134/78   Pulse 77   Temp 98.6 F (37 C) (Temporal)   Ht 5\' 7"  (1.702 m)   Wt 153 lb (69.4 kg)   SpO2 97%   BMI 23.96 kg/m  Objective:   Physical Exam Cardiovascular:     Rate and Rhythm: Normal rate and regular rhythm.  Pulmonary:     Effort: Pulmonary effort is normal.     Breath sounds: Normal breath sounds.  Musculoskeletal:     Cervical back: Neck supple.  Skin:    General: Skin is warm and dry.           Assessment & Plan:      This visit occurred during the SARS-CoV-2 public health emergency.  Safety protocols were in place, including screening questions prior to the visit, additional usage of staff PPE, and extensive cleaning of exam room while observing appropriate contact time as indicated for disinfecting solutions.

## 2020-11-15 ENCOUNTER — Other Ambulatory Visit: Payer: Self-pay | Admitting: Primary Care

## 2020-11-15 DIAGNOSIS — E039 Hypothyroidism, unspecified: Secondary | ICD-10-CM

## 2020-11-15 MED ORDER — LEVOTHYROXINE SODIUM 88 MCG PO TABS: ORAL_TABLET | ORAL | 3 refills | Status: DC

## 2020-11-15 MED ORDER — LEVOTHYROXINE SODIUM 100 MCG PO TABS
ORAL_TABLET | ORAL | 3 refills | Status: DC
Start: 2020-11-15 — End: 2021-09-27

## 2020-11-26 ENCOUNTER — Other Ambulatory Visit: Payer: Self-pay

## 2020-11-26 DIAGNOSIS — E1151 Type 2 diabetes mellitus with diabetic peripheral angiopathy without gangrene: Secondary | ICD-10-CM

## 2020-11-26 NOTE — Telephone Encounter (Signed)
Patient called requesting a refill on her Novolog Pen, Lantus Pen, and Freestyle Test Strips to be sent to [place], [place].

## 2020-11-27 ENCOUNTER — Telehealth: Payer: Self-pay

## 2020-11-27 ENCOUNTER — Other Ambulatory Visit: Payer: Self-pay

## 2020-11-27 DIAGNOSIS — E1151 Type 2 diabetes mellitus with diabetic peripheral angiopathy without gangrene: Secondary | ICD-10-CM

## 2020-11-27 MED ORDER — LANTUS SOLOSTAR 100 UNIT/ML ~~LOC~~ SOPN: 60.0000 [IU] | PEN_INJECTOR | Freq: Every day | SUBCUTANEOUS | 3 refills | Status: DC

## 2020-11-27 MED ORDER — FREESTYLE LITE TEST VI STRP: ORAL_STRIP | 0 refills | Status: DC

## 2020-11-27 MED ORDER — NOVOLOG FLEXPEN 100 UNIT/ML ~~LOC~~ SOPN: PEN_INJECTOR | SUBCUTANEOUS | 3 refills | Status: DC

## 2020-11-27 NOTE — Addendum Note (Signed)
Addended by: [name] on: 11/27/2020 06:30 PM   Modules accepted: Orders

## 2020-11-27 NOTE — Chronic Care Management (AMB) (Signed)
    Chronic Care Management Pharmacy Assistant   Name: Sarah Payne  MRN: [id number] DOB: June 02, 1946   Reason for Encounter: Refill Request    Medications: Outpatient Encounter Medications as of 11/27/2020  Medication Sig  . albuterol (VENTOLIN HFA) 108 (90 Base) MCG/ACT inhaler Inhale 2 puffs into the lungs every 6 (six) hours as needed for wheezing or shortness of breath.  Marland Kitchen amitriptyline (ELAVIL) 50 MG tablet Take 50 mg by mouth at bedtime as needed for sleep.  . carvedilol (COREG) 6.25 MG tablet Take 1 tablet (6.25 mg total) by mouth 2 (two) times daily.  . clopidogrel (PLAVIX) 75 MG tablet Take 1 tablet (75 mg total) by mouth daily.  . enalapril (VASOTEC) 20 MG tablet Take 20 mg by mouth daily.  Marland Kitchen ezetimibe (ZETIA) 10 MG tablet Take 1 tablet (10 mg total) by mouth daily. For cholesterol.  . furosemide (LASIX) 20 MG tablet Take 1 tablet (20 mg total) by mouth daily as needed for edema.  . gabapentin (NEURONTIN) 300 MG capsule Take 1 capsule (300 mg total) by mouth 3 (three) times daily.  Marland Kitchen glucose blood (FREESTYLE LITE) test strip Test up to four times a day as needed. DX  . insulin aspart (NOVOLOG FLEXPEN) 100 UNIT/ML FlexPen Inject 22 units with breakfast and lunch, inject 14 units with dinner.  . insulin glargine (LANTUS SOLOSTAR) 100 UNIT/ML Solostar Pen Inject 30 Units into the skin 2 (two) times daily. (Patient taking differently: Inject 60 Units into the skin at bedtime.)  . Insulin Pen Needle (INSUPEN PEN NEEDLES) 32G X 4 MM MISC Use to inject insulin 4 times daily.  . isosorbide mononitrate (IMDUR) 30 MG 24 hr tablet Take 1 tablet (30 mg total) by mouth daily.  Marland Kitchen levothyroxine (SYNTHROID) 100 MCG tablet Take 1 tablet by mouth on an empty stomach with water only on Sunday, Monday, and Tuesday. No food or other medications for 30 minutes.  Marland Kitchen levothyroxine (SYNTHROID) 88 MCG tablet Take 1 tablet by mouth every morning on an empty stomach with water only on Wednesday, Thursday,  Friday, Saturday.  No food or other medications for 30 minutes.  . meloxicam (MOBIC) 15 MG tablet Take 1 tablet (15 mg total) by mouth daily.  Marland Kitchen neomycin-polymyxin b-dexamethasone (MAXITROL) 3.5-10000-0.1 SUSP Place 1 drop into the left eye 3 (three) times daily.  . rosuvastatin (CRESTOR) 40 MG tablet Take 1 tablet (40 mg total) by mouth every evening. For cholesterol.  . traZODone (DESYREL) 100 MG tablet TAKE 1 TABLET BY MOUTH AT BEDTIME AS NEEDED FOR SLEEP   No facility-administered encounter medications on file as of 11/27/2020.   Patient requested refill for both her Novolog and Lantus to [facility]. She also notes she is completely out of Freestyle Lite test strips. She would like 1 box be sent to [facility] in [place] and a full prescription be sent to [facility].   Follow-Up:  Pharmacist Review  [name], CPP notified  Margaretmary Dys, [place] [phone]  Total time spent for month:  CPA 28

## 2020-11-27 NOTE — Telephone Encounter (Signed)
Spoke to patient refills have been sent as she requested.

## 2020-11-27 NOTE — Chronic Care Management (AMB) (Addendum)
Chronic Care Management Pharmacy Assistant   Name: Sarah Payne  MRN: [id number] DOB: May 28, 1946  Reason for Encounter: Disease State - Diabetes and Hypertension   Recent office visits:  11/14/20- Alma Friendly, NP- Ordered A1C, TSH. Change levothyroxine to the following: levothyroxine 100 mcg on Sunday, Monday, Tuesday (3 days), Levothyroxine 88 mcg on Wed, Thurs, Fri, Sat (4 days). Decrease Novolog from 22 units TID to 22 units with breakfast and lunch and 14 units at dinner. Continue 60 units of Lantus at bedtime. Follow up in 3 months.  Recent consult visits:  None since last CCM contact  Hospital visits:  2/23/22[facility] admission for Abdominal aortogram with lower extremity.  Medications: Outpatient Encounter Medications as of 11/27/2020  Medication Sig   albuterol (VENTOLIN HFA) 108 (90 Base) MCG/ACT inhaler Inhale 2 puffs into the lungs every 6 (six) hours as needed for wheezing or shortness of breath.   amitriptyline (ELAVIL) 50 MG tablet Take 50 mg by mouth at bedtime as needed for sleep.   carvedilol (COREG) 6.25 MG tablet Take 1 tablet (6.25 mg total) by mouth 2 (two) times daily.   clopidogrel (PLAVIX) 75 MG tablet Take 1 tablet (75 mg total) by mouth daily.   enalapril (VASOTEC) 20 MG tablet Take 20 mg by mouth daily.   ezetimibe (ZETIA) 10 MG tablet Take 1 tablet (10 mg total) by mouth daily. For cholesterol.   furosemide (LASIX) 20 MG tablet Take 1 tablet (20 mg total) by mouth daily as needed for edema.   gabapentin (NEURONTIN) 300 MG capsule Take 1 capsule (300 mg total) by mouth 3 (three) times daily.   glucose blood (FREESTYLE LITE) test strip Test up to four times a day as needed. DX   insulin aspart (NOVOLOG FLEXPEN) 100 UNIT/ML FlexPen Inject 22 units with breakfast and lunch, inject 14 units with dinner.   insulin glargine (LANTUS SOLOSTAR) 100 UNIT/ML Solostar Pen Inject 30 Units into the skin 2 (two) times daily. (Patient taking differently: Inject 60  Units into the skin at bedtime.)   Insulin Pen Needle (INSUPEN PEN NEEDLES) 32G X 4 MM MISC Use to inject insulin 4 times daily.   isosorbide mononitrate (IMDUR) 30 MG 24 hr tablet Take 1 tablet (30 mg total) by mouth daily.   levothyroxine (SYNTHROID) 100 MCG tablet Take 1 tablet by mouth on an empty stomach with water only on Sunday, Monday, and Tuesday. No food or other medications for 30 minutes.   levothyroxine (SYNTHROID) 88 MCG tablet Take 1 tablet by mouth every morning on an empty stomach with water only on Wednesday, Thursday, Friday, Saturday.  No food or other medications for 30 minutes.   meloxicam (MOBIC) 15 MG tablet Take 1 tablet (15 mg total) by mouth daily.   neomycin-polymyxin b-dexamethasone (MAXITROL) 3.5-10000-0.1 SUSP Place 1 drop into the left eye 3 (three) times daily.   rosuvastatin (CRESTOR) 40 MG tablet Take 1 tablet (40 mg total) by mouth every evening. For cholesterol.   traZODone (DESYREL) 100 MG tablet TAKE 1 TABLET BY MOUTH AT BEDTIME AS NEEDED FOR SLEEP   No facility-administered encounter medications on file as of 11/27/2020.     Recent Relevant Labs: Lab Results  Component Value Date/Time   HGBA1C 8.5 (A) 11/14/2020 11:14 AM   HGBA1C 10.4 (A) 08/03/2020 09:17 AM   HGBA1C 10.3 (H) 12/24/2018 12:27 PM   HGBA1C 10.4 (H) 09/16/2016 11:53 AM   HGBA1C 10.9 (H) 08/02/2014 04:08 AM   HGBA1C 7.3 (H) 08/19/2013 12:19 PM  MICROALBUR 1.2 05/30/2013 04:48 PM    Kidney Function Lab Results  Component Value Date/Time   CREATININE 1.15 (H) 08/30/2020 09:41 AM   CREATININE 1.21 (H) 08/03/2020 10:05 AM   CREATININE 1.06 (H) 04/04/2016 05:21 PM   CREATININE 1.24 08/02/2014 04:08 AM   CREATININE 1.33 (H) 08/01/2014 12:53 PM   CREATININE 1.07 11/24/2011 03:41 PM   GFR 44.80 (L) 08/03/2020 10:05 AM   GFRNONAA 48 (L) 08/30/2020 09:41 AM   GFRNONAA 46 (L) 08/02/2014 04:08 AM   GFRNONAA 47 (L) 08/20/2013 05:07 AM   GFRNONAA 55 (L) 11/24/2011 03:41 PM   GFRAA 55 (L)  08/30/2020 09:41 AM   GFRAA 56 (L) 08/02/2014 04:08 AM   GFRAA 55 (L) 08/20/2013 05:07 AM   GFRAA 64 11/24/2011 03:41 PM     Current antihyperglycemic regimen:  Novolog 22 units with breakfast and lunch and 14 units at dinner.  Lantus 60 units at bedtime.   Patient verbally confirms she is taking the above medications as directed. Yes  What recent interventions/DTPs have been made to improve glycemic control:  Decreased Novolog from 22 units TID to 22 units with breakfast and lunch and 14 units at dinner  Have there been any recent hospitalizations or ED visits since last visit with CPP? No  Patient denies hypoglycemic symptoms, including Pale, Sweaty, Shaky, Hungry, Nervous/irritable and Vision changes  Patient denies hyperglycemic symptoms, including blurry vision, excessive thirst, fatigue, polyuria and weakness  How often are you checking your blood sugar? 6-7 times a day  What are your blood sugars ranging? - States she is completely out of test strips. Does not have any readings.  On insulin? Yes  How many units: Novolog 22 units with breakfast and lunch and 14 units at dinner.  Lantus 60 units at bedtime  During the week, how often does your blood glucose drop below 70? Not in the last week.   Are you checking your feet daily/regularly? Yes  Adherence Review: Is the patient currently on a STATIN medication? Yes Is the patient currently on ACE/ARB medication? Yes Does the patient have >5 day gap between last estimated fill dates? CPP to review   Recent Office Vitals: BP Readings from Last 3 Encounters:  11/14/20 134/78  09/27/20 (!) 156/84  09/12/20 (!) 160/72   Pulse Readings from Last 3 Encounters:  11/14/20 77  09/27/20 61  09/12/20 (!) 58    Wt Readings from Last 3 Encounters:  11/14/20 153 lb (69.4 kg)  09/27/20 155 lb (70.3 kg)  09/12/20 157 lb (71.2 kg)     Kidney Function Lab Results  Component Value Date/Time   CREATININE 1.15 (H) 08/30/2020  09:41 AM   CREATININE 1.21 (H) 08/03/2020 10:05 AM   CREATININE 1.06 (H) 04/04/2016 05:21 PM   CREATININE 1.24 08/02/2014 04:08 AM   CREATININE 1.33 (H) 08/01/2014 12:53 PM   CREATININE 1.07 11/24/2011 03:41 PM   GFR 44.80 (L) 08/03/2020 10:05 AM   GFRNONAA 48 (L) 08/30/2020 09:41 AM   GFRNONAA 46 (L) 08/02/2014 04:08 AM   GFRNONAA 47 (L) 08/20/2013 05:07 AM   GFRNONAA 55 (L) 11/24/2011 03:41 PM   GFRAA 55 (L) 08/30/2020 09:41 AM   GFRAA 56 (L) 08/02/2014 04:08 AM   GFRAA 55 (L) 08/20/2013 05:07 AM   GFRAA 64 11/24/2011 03:41 PM    BMP Latest Ref Rng & Units 08/30/2020 08/03/2020 06/04/2020  Glucose 65 - 99 mg/dL 215(H) 210(H) 162(H)  BUN 8 - 27 mg/dL 17 27(H) 22  Creatinine 0.57 -  1.00 mg/dL 1.15(H) 1.21(H) 1.27(H)  BUN/Creat Ratio 12 - 28 15 - -  Sodium 134 - 144 mmol/L 138 137 135  Potassium 3.5 - 5.2 mmol/L 4.2 4.6 4.3  Chloride 96 - 106 mmol/L 102 100 97  CO2 20 - 29 mmol/L 23 29 31   Calcium 8.7 - 10.3 mg/dL 9.0 9.5 9.7     Current antihypertensive regimen:  Enalapril 20mg  take one tablet daily Carvedilol 6.25mg  take one tablet twice daily   Patient verbally confirms she is taking the above medications as directed. Yes  How often are you checking your Blood Pressure? infrequently  No readings since last contact.  Wrist or arm cuff: Arm Caffeine intake: yes Salt intake: Tries to limit salt.  OTC medications including pseudoephedrine or NSAIDs? No  Any readings above 180/120? Unknown If yes any symptoms of hypertensive emergency? patient denies any symptoms of high blood pressure  What recent interventions/DTPs have been made by any provider to improve Blood Pressure control since last CPP Visit:  No recent interventions for hypertension.   Any recent hospitalizations or ED visits since last visit with CPP? No  What diet changes have been made to improve Blood Pressure Control?  No changes in diet  What exercise is being done to improve your Blood Pressure  Control?  States she has been moderately active recently  Adherence Review: Is the patient currently on ACE/ARB medication? Yes Does the patient have >5 day gap between last estimated fill dates? CPP to review   Star Rating Drugs:  Medication:  Last Fill: Day Supply Enalapril 20 mg No refill history Rosuvastatin 40 mg No refill history   Patient states she feels like she is doing better since last time the CCM team contacted her. She is completely out of test strips. She requested 1 box be sent to [facility] and a full prescription be sent to [facility]. She is also requesting a refill on both her Novolog and Lantus to be sent to [facility].   Advised patient that someone from the CCM team will call her in 2 weeks to check in and get updated readings of both BP and BG. Patient agreed with this.  Follow-Up:  Pharmacist Review  [name], CPP notified  Margaretmary Dys, [place] Assistant [phone]  I have reviewed the care management and care coordination activities outlined in this encounter and I am certifying that I agree with the content of this note. No further action required.  [name], PharmD Clinical Pharmacist [facility] at [facility] [phone]

## 2020-11-27 NOTE — Telephone Encounter (Signed)
Did you send 1 box of Freestyle lite strips to [facility]? It looks like the only thing that was refilled was the Freestyle lite strips to [place].  I went ahead and refilled the Novolog and Lantus to [place] now.

## 2020-12-11 NOTE — Chronic Care Management (AMB) (Addendum)
Contacted patient for second outreach this month to discuss blood pressure and blood sugar readings.   Recent Relevant Labs: Lab Results  Component Value Date/Time   HGBA1C 8.5 (A) 11/14/2020 11:14 AM   HGBA1C 10.4 (A) 08/03/2020 09:17 AM   HGBA1C 10.3 (H) 12/24/2018 12:27 PM   HGBA1C 10.4 (H) 09/16/2016 11:53 AM   HGBA1C 10.9 (H) 08/02/2014 04:08 AM   HGBA1C 7.3 (H) 08/19/2013 12:19 PM   MICROALBUR 1.2 05/30/2013 04:48 PM    Kidney Function Lab Results  Component Value Date/Time   CREATININE 1.15 (H) 08/30/2020 09:41 AM   CREATININE 1.21 (H) 08/03/2020 10:05 AM   CREATININE 1.06 (H) 04/04/2016 05:21 PM   CREATININE 1.24 08/02/2014 04:08 AM   CREATININE 1.33 (H) 08/01/2014 12:53 PM   CREATININE 1.07 11/24/2011 03:41 PM   GFR 44.80 (L) 08/03/2020 10:05 AM   GFRNONAA 48 (L) 08/30/2020 09:41 AM   GFRNONAA 46 (L) 08/02/2014 04:08 AM   GFRNONAA 47 (L) 08/20/2013 05:07 AM   GFRNONAA 55 (L) 11/24/2011 03:41 PM   GFRAA 55 (L) 08/30/2020 09:41 AM   GFRAA 56 (L) 08/02/2014 04:08 AM   GFRAA 55 (L) 08/20/2013 05:07 AM   GFRAA 64 11/24/2011 03:41 PM   Current antihyperglycemic regimen:  Novolog 22 units with breakfast and lunch and 14 units at dinner Lantus 60 units at bedtime  Patient verbally confirms she is taking the above medications as directed. Yes  Patient denies hypoglycemic symptoms, including Pale, Sweaty, Shaky, Hungry, Nervous/irritable and Vision changes  Patient denies hyperglycemic symptoms, including blurry vision, excessive thirst, fatigue, polyuria and weakness- but does note she has felt overall bad.  How often are you checking your blood sugar? 1-2 times daily  What are your blood sugars ranging?  Fasting: 398, 275, 209, 167 Before meals: N/A After meals: N/A Bedtime: 277  States that when she has a change to her thyroid medicaiton her blood sugars normally run high. She states her thyroid medication was changed about 3 weeks ago and she is noticing her blood  sugar start to come back within normal range.   On insulin? Yes How many units: Novolog 22 units with breakfast and lunch and 14 units at dinner.  Lantus 60 units at bedtime  Recent Office Vitals: BP Readings from Last 3 Encounters:  11/14/20 134/78  09/27/20 (!) 156/84  09/12/20 (!) 160/72   Pulse Readings from Last 3 Encounters:  11/14/20 77  09/27/20 61  09/12/20 (!) 58    Wt Readings from Last 3 Encounters:  11/14/20 153 lb (69.4 kg)  09/27/20 155 lb (70.3 kg)  09/12/20 157 lb (71.2 kg)     Kidney Function Lab Results  Component Value Date/Time   CREATININE 1.15 (H) 08/30/2020 09:41 AM   CREATININE 1.21 (H) 08/03/2020 10:05 AM   CREATININE 1.06 (H) 04/04/2016 05:21 PM   CREATININE 1.24 08/02/2014 04:08 AM   CREATININE 1.33 (H) 08/01/2014 12:53 PM   CREATININE 1.07 11/24/2011 03:41 PM   GFR 44.80 (L) 08/03/2020 10:05 AM   GFRNONAA 48 (L) 08/30/2020 09:41 AM   GFRNONAA 46 (L) 08/02/2014 04:08 AM   GFRNONAA 47 (L) 08/20/2013 05:07 AM   GFRNONAA 55 (L) 11/24/2011 03:41 PM   GFRAA 55 (L) 08/30/2020 09:41 AM   GFRAA 56 (L) 08/02/2014 04:08 AM   GFRAA 55 (L) 08/20/2013 05:07 AM   GFRAA 64 11/24/2011 03:41 PM    BMP Latest Ref Rng & Units 08/30/2020 08/03/2020 06/04/2020  Glucose 65 - 99 mg/dL 215(H) 210(H) 162(H)  BUN 8 - 27 mg/dL 17 27(H) 22  Creatinine 0.57 - 1.00 mg/dL 1.15(H) 1.21(H) 1.27(H)  BUN/Creat Ratio 12 - 28 15 - -  Sodium 134 - 144 mmol/L 138 137 135  Potassium 3.5 - 5.2 mmol/L 4.2 4.6 4.3  Chloride 96 - 106 mmol/L 102 100 97  CO2 20 - 29 mmol/L 23 29 31   Calcium 8.7 - 10.3 mg/dL 9.0 9.5 9.7     Current antihypertensive regimen:  Enalapril 20 mg  - take one tablet daily Carvedilol 6.25 mg - take one tablet twice daily   Patient verbally confirms she is taking the above medications as directed. Yes  How often are you checking your Blood Pressure? infrequently  she checks her blood pressure in the morning after taking her medication.  Current  home BP readings:   DATE:             BP               PULSE  -  144/72  -  -  140/75  -  Any readings above 180/120? No If yes any symptoms of hypertensive emergency? patient denies any symptoms of high blood pressure  Patient is aware CCM team will follow up with her in about 2 weeks.   Follow-Up:  Pharmacist Review  [name], CPP notified  Margaretmary Dys, [place] Assistant [phone]  I have reviewed the care management and care coordination activities outlined in this encounter and I am certifying that I agree with the content of this note. No further action required.  [name], PharmD Clinical Pharmacist [facility] at [facility] [phone]

## 2020-12-13 ENCOUNTER — Encounter: Payer: Medicare PPO | Admitting: Student in an Organized Health Care Education/Training Program

## 2021-01-02 ENCOUNTER — Ambulatory Visit: Payer: Medicare PPO | Admitting: Cardiovascular Disease

## 2021-01-02 ENCOUNTER — Ambulatory Visit (INDEPENDENT_AMBULATORY_CARE_PROVIDER_SITE_OTHER): Payer: Medicare PPO

## 2021-01-02 ENCOUNTER — Other Ambulatory Visit: Payer: Self-pay

## 2021-01-02 DIAGNOSIS — E782 Mixed hyperlipidemia: Secondary | ICD-10-CM

## 2021-01-02 DIAGNOSIS — I10 Essential (primary) hypertension: Secondary | ICD-10-CM

## 2021-01-02 DIAGNOSIS — E1151 Type 2 diabetes mellitus with diabetic peripheral angiopathy without gangrene: Secondary | ICD-10-CM | POA: Diagnosis not present

## 2021-01-02 NOTE — Progress Notes (Signed)
Chronic Care Management Pharmacy Note  01/02/2021 Name:  Sarah Payne MRN:  [id number] DOB:  [birth year]  Subjective: Sarah Payne is an 75 y.o. year old female who is a primary patient of [name], NP.  The CCM team was consulted for assistance with disease management and care coordination needs.    Engaged with patient by telephone for follow up visit in response to provider referral for pharmacy case management and/or care coordination services.   Consent to Services:  The patient was given information about Chronic Care Management services, agreed to services, and gave verbal consent prior to initiation of services.  Please see initial visit note for detailed documentation.   Patient Care Team: [name], NP as PCP - General (Internal Medicine) Rockey Situ Kathlene November, MD as PCP - Cardiology (Cardiology) Minna Merritts, MD (Cardiology) Inda Castle, MD (Inactive) as Consulting Physician (Gastroenterology) Meade Maw, MD as Consulting Physician (Neurosurgery) Philemon Kingdom, MD as Consulting Physician (Internal Medicine) Minna Merritts, MD as Consulting Physician (Cardiology) [name], [facility] as Pharmacist (Pharmacist) Birder Robson, MD as Referring Physician (Ophthalmology)  Recent office visits: 11/14/20- Alma Friendly, NP- Ordered A1C, TSH. Change levothyroxine to the following: levothyroxine 100 mcg on Sunday, Monday, Tuesday (3 days), Levothyroxine 88 mcg on Wed, Thurs, Fri, Sat (4 days). Decrease Novolog from 22 units TID to 22 units with breakfast and lunch and 14 units at dinner. Continue 60 units of Lantus at bedtime. Follow up in 3 months.  Recent consult visits: None since last CCM contact  Hospital visits: 09/12/20 - ED - PAD  Objective:  Lab Results  Component Value Date   CREATININE 1.15 (H) 08/30/2020   BUN 17 08/30/2020   GFR 44.80 (L) 08/03/2020   GFRNONAA 48 (L) 08/30/2020   GFRAA 55 (L) 08/30/2020   NA 138  08/30/2020   K 4.2 08/30/2020   CALCIUM 9.0 08/30/2020   CO2 23 08/30/2020    Lab Results  Component Value Date/Time   HGBA1C 8.5 (A) 11/14/2020 11:14 AM   HGBA1C 10.4 (A) 08/03/2020 09:17 AM   HGBA1C 10.3 (H) 12/24/2018 12:27 PM   HGBA1C 10.4 (H) 09/16/2016 11:53 AM   HGBA1C 10.9 (H) 08/02/2014 04:08 AM   HGBA1C 7.3 (H) 08/19/2013 12:19 PM   GFR 44.80 (L) 08/03/2020 10:05 AM   GFR 42.32 (L) 06/04/2020 09:29 AM   MICROALBUR 1.2 05/30/2013 04:48 PM    Last diabetic Eye exam:  Lab Results  Component Value Date/Time   HMDIABEYEEXA No Retinopathy 10/10/2020 12:00 AM    Last diabetic Foot exam:  02/01/20 with PCP   Lab Results  Component Value Date   CHOL 102 08/03/2020   HDL 38.70 (L) 08/03/2020   LDLCALC 29 08/03/2020   LDLDIRECT 143.0 12/24/2018   TRIG 171.0 (H) 08/03/2020   CHOLHDL 3 08/03/2020    Hepatic Function Latest Ref Rng & Units 08/03/2020 08/29/2019 12/24/2018  Total Protein 6.0 - 8.3 g/dL 6.3 6.3 7.1  Albumin 3.5 - 5.2 g/dL 4.2 4.1 4.3  AST 0 - 37 U/L _0 ALT 0 - 35 U/L _1 Alk Phosphatase 39 - 117 U/L 68 91 80  Total Bilirubin 0.2 - 1.2 mg/dL 0.5 0.4 0.4  Bilirubin, Direct 0.0 - 0.3 mg/dL - - -    Lab Results  Component Value Date/Time   TSH 0.21 (L) 11/14/2020 11:35 AM   TSH 5.36 (H) 09/07/2020 09:55 AM   FREET4 0.95 01/18/2018 10:40 AM  FREET4 0.78 04/17/2017 09:43 AM    CBC Latest Ref Rng & Units 08/30/2020 04/02/2020 08/24/2018  WBC 3.4 - 10.8 x10E3/uL 8.4 9.8 10.9(H)  Hemoglobin 11.1 - 15.9 g/dL 13.8 13.7 14.3  Hematocrit 34.0 - 46.6 % 41.5 40.1 43.0  Platelets 150 - 450 x10E3/uL 242 246.0 274    Lab Results  Component Value Date/Time   VD25OH 41 02/25/2013 11:37 AM    Clinical ASCVD: Yes  The ASCVD Risk score Mikey Bussing DC Jr., et al., 2013) failed to calculate for the following reasons:   The patient has a prior MI or stroke diagnosis    Depression screen [facility] 2/9 04/02/2020 05/12/2018 04/15/2018  Decreased Interest 3 0 0  Down,  Depressed, Hopeless 3 0 0  PHQ - 2 Score 6 0 0  Altered sleeping 3 - -  Tired, decreased energy 3 - -  Change in appetite 0 - -  Feeling bad or failure about yourself  0 - -  Trouble concentrating 0 - -  Moving slowly or fidgety/restless 0 - -  Suicidal thoughts 0 - -  PHQ-9 Score 12 - -  Difficult doing work/chores Extremely dIfficult - -  Some recent data might be hidden    Social History   Tobacco Use  Smoking Status Every Day   Packs/day: 1.00   Years: 45.00   Pack years: 45.00   Types: Cigarettes  Smokeless Tobacco Never  Tobacco Comments   Has cut back, trying to quit.    BP Readings from Last 3 Encounters:  11/14/20 134/78  09/27/20 (!) 156/84  09/12/20 (!) 160/72   Pulse Readings from Last 3 Encounters:  11/14/20 77  09/27/20 61  09/12/20 (!) 58   Wt Readings from Last 3 Encounters:  11/14/20 153 lb (69.4 kg)  09/27/20 155 lb (70.3 kg)  09/12/20 157 lb (71.2 kg)    Assessment/Interventions: Review of patient past medical history, allergies, medications, health status, including review of consultants reports, laboratory and other test data, was performed as part of comprehensive evaluation and provision of chronic care management services.   SDOH:  (Social Determinants of Health) assessments and interventions performed: Yes    CCM Care Plan  Allergies  Allergen Reactions   No Known Allergies     Medications Reviewed Today     Reviewed by [name], NP (Nurse Practitioner) on 11/14/20 at 69  Med List Status: <None>   Medication Order Taking? Sig Documenting Provider Last Dose Status Informant  albuterol (VENTOLIN HFA) 108 (90 Base) MCG/ACT inhaler [id number] Yes Inhale 2 puffs into the lungs every 6 (six) hours as needed for wheezing or shortness of breath. Minna Merritts, MD Taking Active Self  amitriptyline (ELAVIL) 50 MG tablet [id number] Yes Take 50 mg by mouth at bedtime as needed for sleep. [provider] Taking Active    carvedilol (COREG) 6.25 MG tablet [id number] Yes Take 1 tablet (6.25 mg total) by mouth 2 (two) times daily. Minna Merritts, MD Taking Active Self  clopidogrel (PLAVIX) 75 MG tablet [id number] Yes Take 1 tablet (75 mg total) by mouth daily. Minna Merritts, MD Taking Active Self  enalapril (VASOTEC) 20 MG tablet [id number] Yes Take 20 mg by mouth daily. [provider] Taking Active   ezetimibe (ZETIA) 10 MG tablet [id number] Yes Take 1 tablet (10 mg total) by mouth daily. For cholesterol. Minna Merritts, MD Taking Active Self  furosemide (LASIX) 20 MG tablet [id number] Yes Take 1 tablet (20 mg total) by mouth  daily as needed for edema. Minna Merritts, MD Taking Active Self  gabapentin (NEURONTIN) 300 MG capsule [id number] Yes Take 1 capsule (300 mg total) by mouth 3 (three) times daily. Gillis Santa, MD Taking Active Self  glucose blood (FREESTYLE LITE) test strip [id number] Yes Test up to four times a day as needed. DX [name], NP Taking Active   insulin aspart (NOVOLOG FLEXPEN) 100 UNIT/ML FlexPen [id number]  Inject 22 units with breakfast and lunch, inject 14 units with dinner. [name], NP  Active   insulin glargine (LANTUS SOLOSTAR) 100 UNIT/ML Solostar Pen [id number] Yes Inject 30 Units into the skin 2 (two) times daily.  Patient taking differently: Inject 60 Units into the skin at bedtime.   [name], NP Taking Active Self  Insulin Pen Needle (INSUPEN PEN NEEDLES) 32G X 4 MM MISC [id number] Yes Use to inject insulin 4 times daily. [name], NP Taking Active Self  isosorbide mononitrate (IMDUR) 30 MG 24 hr tablet [id number] Yes Take 1 tablet (30 mg total) by mouth daily. Minna Merritts, MD Taking Active Self  levothyroxine (SYNTHROID) 100 MCG tablet [id number] Yes Take 100 mcg by mouth daily before breakfast. On an empty stomach with water only. No food or other medications for 30 minutes. [provider] Taking Active   meloxicam  (MOBIC) 15 MG tablet [id number] Yes Take 1 tablet (15 mg total) by mouth daily. Gillis Santa, MD Taking Active Self  neomycin-polymyxin b-dexamethasone (MAXITROL) 3.5-10000-0.1 SUSP [id number] Yes Place 1 drop into the left eye 3 (three) times daily. [provider] Taking Active Self  rosuvastatin (CRESTOR) 40 MG tablet [id number] Yes Take 1 tablet (40 mg total) by mouth every evening. For cholesterol. Minna Merritts, MD Taking Active Self  traZODone (DESYREL) 100 MG tablet [id number] Yes TAKE 1 TABLET BY MOUTH AT BEDTIME AS NEEDED FOR SLEEP [name], NP Taking Active   Med List Note Landis Martins, [place] 02/11/18 [id number]): 02-11-18 Message sent to Dr. Rockey Situ in Epic for permission to stop Plavix 7 days. DW            Patient Active Problem List   Diagnosis Date Noted   Abdominal bloating 03/22/2019   Vaginal burning 03/22/2019   Foul smelling urine 03/22/2019   Acute pain of left shoulder 01/17/2019   Chronic musculoskeletal pain 11/15/2018   Atherosclerosis of native coronary artery of native heart with stable angina pectoris ([place]) 05/17/2018   Chronic neck pain 02/10/2018   Chronic pain of right upper extremity 02/10/2018   Cervical spondylosis 02/10/2018   Neck pain 01/19/2018   Right shoulder pain 01/19/2018   Chronic pain syndrome 01/19/2018   Lumbar degenerative disc disease 09/29/2017   Lumbar spondylosis with myelopathy 09/29/2017   Chronic bilateral back pain 05/19/2017   Insomnia 01/12/2017   Cannabis use disorder, mild, abuse 09/03/2016   Cognitive changes 09/03/2016   Takotsubo cardiomyopathy 01/02/2016   Frequent PVCs 01/02/2016   HTN (hypertension) 09/20/2015   Benign neoplasm of colon 11/02/2014   Internal hemorrhoids 11/02/2014   Adjustment disorder with mixed anxiety and depressed mood 08/16/2014   Esophageal reflux 05/23/2014   Diverticulosis of colon without hemorrhage 05/17/2014   Peripheral polyneuropathy 05/05/2014   Tobacco dependence  05/05/2014   COPD, moderate ([place]) 03/29/2013   Noncompliance with diabetes treatment 02/27/2013   Obstructive sleep apnea 82/50/5397   Chronic systolic congestive heart failure ([place]) 67/34/1937   Lichen sclerosus et atrophicus 05/20/2012   Bronchitis, chronic obstructive ([place]) 05/20/2012  Diabetes mellitus type 2 with peripheral artery disease ([place]) 08/28/2011   History of lumbar fusion 06/29/2011   Depression 06/24/2011   Hypothyroidism 05/03/2011   CAD, multiple vessel 02/24/2011   Tobacco abuse 02/24/2011   Mixed hyperlipidemia 02/24/2011   Postural dizziness with near syncope 02/24/2011    Immunization History  Administered Date(s) Administered   Fluad Quad(high Dose 65+) 04/02/2020   Influenza Split 05/20/2012   Influenza, High Dose Seasonal PF 04/17/2016, 04/17/2017, 05/04/2018   Influenza, Seasonal, Injecte, Preservative Fre 05/07/2006   Influenza,inj,Quad PF,6+ Mos 04/20/2013, 04/26/2014, 06/01/2015, 04/25/2019   PFIZER(Purple Top)SARS-COV-2 Vaccination 09/15/2019, 10/11/2019, 06/05/2020   Pneumococcal Conjugate-13 04/26/2014   Pneumococcal Polysaccharide-23 03/28/2013, 04/25/2019   Tdap 03/28/2013    Conditions to be addressed/monitored:  Hypertension and Diabetes  Patient Care Plan: CCM Pharmacy Care Plan     Problem Identified: CHL AMB "PATIENT-SPECIFIC PROBLEM"      Long-Range Goal: Disease Management   Start Date: 09/26/2020  This Visit's Progress: On track  Priority: High  Note:    Current Barriers:  Unable to achieve control of diabetes and blood pressure   Pharmacist Clinical Goal(s):  Over the next 90 days, patient will achieve control of diabetes as evidenced by home BG log through collaboration with PharmD and provider.   Interventions: 1:1 collaboration with [name], NP regarding development and update of comprehensive plan of care as evidenced by provider attestation and co-signature Inter-disciplinary care team collaboration (see  longitudinal plan of care) Comprehensive medication review performed; medication list updated in electronic medical record  Hypertension (BP goal <130/80) -Controlled - per last clinic reading -Current treatment: Enalapril 20 mg - 1 tablet daily Carvedilol 6.55m - 1 BID Isosorbide mononitrate 30 mg - 1 tablet daily -Medications previously tried: none  -Current home readings: none to report today -Reports some lightheadedness. Denies low BP when checked at home. Denies hypertensive symptoms -Educated on BP goals and benefits of medications for prevention of heart attack, stroke and kidney damage; -Counseled to monitor BP at home with symptoms, document, and provide log at future appointments -Recommended to continue current medication  Hyperlipidemia: (LDL goal < 70) -Controlled - LDL 29 -Current treatment: Rosuvastatin 40 mg - 1 tablet daily Zetia 10 mg - 1 tablet daily  -Medications previously tried: none -Recommended to continue current medication  Diabetes (A1c goal <7%) -Uncontrolled - improving, A1c 8.5% 10/2020 -Current medications: Novolog flexpen - Inject 22 before breakfast and lunch and 14 before dinner Lantus solostar - Inject 60 units every evening  -Medications previously tried: none -Current home glucose readings:  June 5 - Fasting 137, After lunch - 265 (gave herself shot) - dropped to 69 before dinner, 184 bedtime (before nightime shot) June 6 - Fasting - 96 (only check that day) June  7 - Fasting 188, Bedtime 112 June 8 - Fasting 161, Bedtime "after nighttime shot" 69 (drank a Pepsi) June 9 - Fasting 240, none further checks documented June 10 - Fasting 118, before dinner 197 (gave herself a shot), 2 hours after dinner 53 June 11 - Before lunch 158, none further checks documented  June 12 - Fasting 169 June 13 - Fasting 158, After lunch 241 (missed insulin this morning) June 14 - Fasting 189 -Current meal patterns:  - Breakfast: slice of toast with butter  or blackberry jam, coffee with splenda, french vanilla creamer, zero sugar.  - Lunch: 2-3 PM in afternoon, light lunch. If she goes out to eat, she takes her Novolog after she gets home. Eats  out with sister once a week at the [employer] (salad bar). Encouraged to take her insulin with her to lunch. She may also pick up fast food for lunch. She reports minimal appetite. She denies much food intake.  - Evening meal: 6:30-7 PM - cooks meat and 2 vegetables 7 days a week. Last night she had chicken casserole with green beans. She loves potatoes.  - Snacks: about 2 days per week, she has 1/2 hershey bar - Drinks: water, coffee, sprite zero, diet pepsi.  -Recommended to continue current medication; Counseled to take Novolog before meals to avoid hypoglycemia. Reviewed rule of 15. Reduce potatoes.   Patient Goals/Self-Care Activities Over the next 90 days, patient will:  - check glucose before meals and bedtime, document, and provide at future appointments -check blood pressure at least once weekly, call if > 140/90 on repeat check  Follow Up Plan: CCM follow up in 3 months      Medication Assistance: None required.  Patient affirms current coverage meets needs.  Patient's preferred pharmacy is:  [facility] [address] [place] - [place] [address] Phone: [phone] Fax: [phone]  [place], [place] [address] Phone: [phone] Fax: [phone]  Reports medication compliance. Most meds from [place] at no cost.   Care Plan and Follow Up Patient Decision:  Patient agrees to Care Plan and Follow-up.  [name], PharmD Clinical Pharmacist [facility] [phone]

## 2021-01-03 ENCOUNTER — Telehealth: Payer: Self-pay

## 2021-01-03 ENCOUNTER — Telehealth: Payer: Self-pay | Admitting: Cardiovascular Disease

## 2021-01-03 NOTE — Telephone Encounter (Signed)
Was able to return call to Mrs. Sarah Payne, she reports "black out" spells. Reports "I get to the edge of the recliner, stand up and walk to the kitchen and when I step up to the kitchen everything gets like I wanna black out". Also reports, "When I get back to the living room and turn around to sit back dowbn, everything goes black and I don't remember how I get from the kitchen back to the chair". Goes on to say that she has fell a few times and bump into the walls and furniture and has bruising d/t these spells.   Stated her BP has been as high as 163/100 and low as 102/60. Stated her "bottom number has been 100 consistently" Also reports stressors that her husband has had triple bypass at [facility] a few weeks ago.   Advised d/t hx of DM that could be the cause of blackouts and dizziness, reports A1C has improved to 8.1 and she manges her BS 5 times a day and they have not dropped.   Advised If her BP drops when standing that could cause the dizziness or blackout spells, encourage her to move slow and keep record of her BP. Advised to stay hydrated, drink plenty of fluids (water) as she reports she does not.  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 She cancel appt with [facility] on 6/15 d/t being with her husband since he just had surgery, able to get her schedule tomorrow, 6/17 at 10:00 am. In the mean time she will drink more fluids, change positions slowly, and get a record from her BP machine to bring in during her office for review.

## 2021-01-03 NOTE — Telephone Encounter (Signed)
Pt c/o Syncope: STAT if syncope occurred within 30 minutes and pt complains of lightheadedness High Priority if episode of passing out, completely, today or in last 24 hours   Did you pass out today? no  When is the last time you passed out? N/a  Has this occurred multiple times? Black outs happening for 2 weeks  Did you have any symptoms prior to passing out? Has not passed out however blacks out when standing up from chair. Reports BP has been running higher than usual (diastolic near 282 ), no chest pain, no sob  Please advise

## 2021-01-03 NOTE — Telephone Encounter (Signed)
Pt is wanting a refill or her meds. She missed her 5/26 meed refill appt due to husband having quadruple bypass surgery.. Dr.Lateef schedule is booked out at least 2 months for med mgmt.

## 2021-01-04 ENCOUNTER — Ambulatory Visit (INDEPENDENT_AMBULATORY_CARE_PROVIDER_SITE_OTHER): Payer: Medicare PPO | Admitting: Physician Assistant

## 2021-01-04 ENCOUNTER — Encounter: Payer: Self-pay | Admitting: Physician Assistant

## 2021-01-04 ENCOUNTER — Other Ambulatory Visit: Payer: Self-pay

## 2021-01-04 VITALS — BP 176/100 | HR 70 | Ht 67.0 in | Wt 154.2 lb

## 2021-01-04 DIAGNOSIS — Z720 Tobacco use: Secondary | ICD-10-CM

## 2021-01-04 DIAGNOSIS — E039 Hypothyroidism, unspecified: Secondary | ICD-10-CM

## 2021-01-04 DIAGNOSIS — E1169 Type 2 diabetes mellitus with other specified complication: Secondary | ICD-10-CM | POA: Diagnosis not present

## 2021-01-04 DIAGNOSIS — E781 Pure hyperglyceridemia: Secondary | ICD-10-CM

## 2021-01-04 DIAGNOSIS — I2510 Atherosclerotic heart disease of native coronary artery without angina pectoris: Secondary | ICD-10-CM | POA: Diagnosis not present

## 2021-01-04 DIAGNOSIS — I779 Disorder of arteries and arterioles, unspecified: Secondary | ICD-10-CM | POA: Diagnosis not present

## 2021-01-04 DIAGNOSIS — J449 Chronic obstructive pulmonary disease, unspecified: Secondary | ICD-10-CM

## 2021-01-04 DIAGNOSIS — I5189 Other ill-defined heart diseases: Secondary | ICD-10-CM | POA: Diagnosis not present

## 2021-01-04 DIAGNOSIS — R55 Syncope and collapse: Secondary | ICD-10-CM

## 2021-01-04 DIAGNOSIS — I5043 Acute on chronic combined systolic (congestive) and diastolic (congestive) heart failure: Secondary | ICD-10-CM | POA: Diagnosis not present

## 2021-01-04 DIAGNOSIS — I739 Peripheral vascular disease, unspecified: Secondary | ICD-10-CM | POA: Diagnosis not present

## 2021-01-04 DIAGNOSIS — R42 Dizziness and giddiness: Secondary | ICD-10-CM | POA: Diagnosis not present

## 2021-01-04 DIAGNOSIS — R7989 Other specified abnormal findings of blood chemistry: Secondary | ICD-10-CM

## 2021-01-04 DIAGNOSIS — I493 Ventricular premature depolarization: Secondary | ICD-10-CM | POA: Diagnosis not present

## 2021-01-04 DIAGNOSIS — I447 Left bundle-branch block, unspecified: Secondary | ICD-10-CM

## 2021-01-04 DIAGNOSIS — R9431 Abnormal electrocardiogram [ECG] [EKG]: Secondary | ICD-10-CM

## 2021-01-04 DIAGNOSIS — I10 Essential (primary) hypertension: Secondary | ICD-10-CM

## 2021-01-04 DIAGNOSIS — E785 Hyperlipidemia, unspecified: Secondary | ICD-10-CM | POA: Diagnosis not present

## 2021-01-04 MED ORDER — EZETIMIBE 10 MG PO TABS: 10.0000 mg | ORAL_TABLET | Freq: Every day | ORAL | 0 refills | Status: DC

## 2021-01-04 MED ORDER — ENALAPRIL MALEATE 20 MG PO TABS: 20.0000 mg | ORAL_TABLET | Freq: Every day | ORAL | 0 refills | Status: DC

## 2021-01-04 MED ORDER — EZETIMIBE 10 MG PO TABS: 10.0000 mg | ORAL_TABLET | Freq: Every day | ORAL | 3 refills | Status: DC

## 2021-01-04 MED ORDER — CLOPIDOGREL BISULFATE 75 MG PO TABS: 75.0000 mg | ORAL_TABLET | Freq: Every day | ORAL | 0 refills | Status: DC

## 2021-01-04 MED ORDER — CARVEDILOL 6.25 MG PO TABS: 6.2500 mg | ORAL_TABLET | Freq: Two times a day (BID) | ORAL | 0 refills | Status: DC

## 2021-01-04 MED ORDER — ISOSORBIDE MONONITRATE ER 30 MG PO TB24: 30.0000 mg | ORAL_TABLET | Freq: Every day | ORAL | 3 refills | Status: DC

## 2021-01-04 MED ORDER — FUROSEMIDE 20 MG PO TABS: 20.0000 mg | ORAL_TABLET | Freq: Every day | ORAL | 0 refills | Status: DC | PRN

## 2021-01-04 MED ORDER — ISOSORBIDE MONONITRATE ER 30 MG PO TB24: 30.0000 mg | ORAL_TABLET | Freq: Every day | ORAL | 0 refills | Status: DC

## 2021-01-04 MED ORDER — ROSUVASTATIN CALCIUM 40 MG PO TABS: 40.0000 mg | ORAL_TABLET | Freq: Every evening | ORAL | 0 refills | Status: DC

## 2021-01-04 MED ORDER — FUROSEMIDE 20 MG PO TABS
20.0000 mg | ORAL_TABLET | Freq: Every day | ORAL | 0 refills | Status: DC | PRN
Start: 2021-01-04 — End: 2021-04-12

## 2021-01-04 NOTE — Progress Notes (Addendum)
Years   Office Visit    Patient Name: Sarah Payne Date of Encounter: 01/04/2021  PCP:  [name], NP   [place]  Cardiologist:  Ida Rogue, MD  Advanced Practice Provider:  No care team member to display Electrophysiologist:  None :[phone]}   Chief Complaint    Chief Complaint  Patient presents with   Follow-up    Pt c/o black outs and elevated BP  Pt states she has not had any black outs this week, doing better by standing slowly and standing still for a minute before moving. Pt states she gets exhausted easily, but is not sure if this could be caused by her thyroid.    orthostatic BP    During orthostatics, when pt stood up she was talking--BP dropped from 162/93 to 134/84. However when taking again while standing, BP went back up to 179/101. Pt stated she was feeling lightheaded when the drop happened.    75 y.o. female with history of CAD s/p prior PCI/DES to the distal RCA (08/2011) and mid left circumflex (08/6832), chronic systolic heart failure, PAD, hyperlipidemia, hypertension, poorly controlled diabetes, stress cardiomyopathy, COPD and prior tobacco use, hypothyroidism, sleep apnea, GERD, PVCs, and who presents today with report of presyncope and exhaustion.  Past Medical History    Past Medical History:  Diagnosis Date   Acute systolic (congestive) heart failure ([place]) 01/01/2016   Cataract    BILATERAL REMOVED   Chronic low back pain    COPD (chronic obstructive pulmonary disease) (HCC)    Coronary artery disease    a. multiple PCIs to the mLCx. b. stent to dRCA 08/2011 in setting of NSTEMI. c. DES to prox-mid LCx for ISR 09/2011. d.  DESx2 to prox-mRCA 07/2012. e. Takotsubo event 12/2015 with patent stents.   Depression    Diabetes mellitus    Diverticulosis    Frequent PVCs    a. Noted in hospital 12/2015.   GERD (gastroesophageal reflux disease)    Hyperlipidemia    Hypertension    Hypothyroidism    Impingement syndrome  of left shoulder    Marijuana abuse    Myocardial infarction (HCC)    x 5   Sleep apnea    mild-does not use cpap   Takotsubo cardiomyopathy    a. 12/2015 - nephew committed suicide 1 week prior, sister died the morning of presentation - initially called a STEMI; cath with patent stents. LVEF 25-30%.   Tendonitis of left rotator cuff    Tobacco abuse    Vascular dementia    Past Surgical History:  Procedure Laterality Date   ABDOMINAL AORTOGRAM W/LOWER EXTREMITY N/A 09/12/2020   Procedure: ABDOMINAL AORTOGRAM W/LOWER EXTREMITY;  Surgeon: Wellington Hampshire, MD;  Location: [facility];  Service: Cardiovascular;  Laterality: N/A;   ABDOMINAL HYSTERECTOMY     APPENDECTOMY     BACK SURGERY     CARDIAC CATHETERIZATION  2013   CARDIAC CATHETERIZATION  2016   CARDIAC CATHETERIZATION N/A 01/01/2016   Procedure: Left Heart Cath and Coronary Angiography;  Surgeon: Jettie Booze, MD;  Location: [facility];  Service: Cardiovascular;  Laterality: N/A;   CATARACT EXTRACTION W/PHACO Left 01/30/2015   Procedure: CATARACT EXTRACTION PHACO AND INTRAOCULAR LENS PLACEMENT (IOC);  Surgeon: Birder Robson, MD;  Location: [facility];  Service: Ophthalmology;  Laterality: Left;  [place] 00:47    CATARACT EXTRACTION W/PHACO Right 02/13/2015   Procedure: CATARACT EXTRACTION PHACO AND INTRAOCULAR LENS PLACEMENT (IOC);  Surgeon: Birder Robson, MD;  Location: [facility];  Service: Ophthalmology;  Laterality: Right;  cassette lot # [id number] H [place]  00:29.9 AP  20.7 CDE  6.20   COLONOSCOPY N/A 11/02/2014   Procedure: COLONOSCOPY;  Surgeon: Inda Castle, MD;  Location: [facility];  Service: Endoscopy;  Laterality: N/A;   CORONARY ANGIOPLASTY  2012   stent x 3    CORONARY ANGIOPLASTY WITH STENT PLACEMENT  2013   ESOPHAGOGASTRODUODENOSCOPY (EGD) WITH PROPOFOL N/A 09/21/2018   Procedure: ESOPHAGOGASTRODUODENOSCOPY (EGD) WITH PROPOFOL;  Surgeon: [name], MD;  Location: [facility];  Service:  Gastroenterology;  Laterality: N/A;   PERIPHERAL VASCULAR ATHERECTOMY Left 09/12/2020   Procedure: PERIPHERAL VASCULAR ATHERECTOMY;  Surgeon: Wellington Hampshire, MD;  Location: [facility];  Service: Cardiovascular;  Laterality: Left;   PERIPHERAL VASCULAR BALLOON ANGIOPLASTY  09/12/2020   Procedure: PERIPHERAL VASCULAR BALLOON ANGIOPLASTY;  Surgeon: Wellington Hampshire, MD;  Location: [facility];  Service: Cardiovascular;;   SHOULDER SURGERY Left 2017    Allergies  Allergies  Allergen Reactions   No Known Allergies     History of Present Illness    Sarah Payne is a 75 y.o. female with PMH as above.  She has history of CAD s/p prior PCI/DES to the distal RCA (08/2011) and mid left circumflex (03/2329), chronic systolic heart failure, PAD, hyperlipidemia, hypertension, poorly controlled diabetes, stress cardiomyopathy, COPD and prior tobacco use, hypothyroidism, and sleep apnea.  She was last seen by her primary cardiologist 07/04/2020.  At that time, she denied symptoms consistent with angina.  She is continued on her current medications.  Smoking cessation was advised, as well as glycemic control.  It was noted she had previously been worked up by endocrine with A1c running high.  Given recent constipation and elevated creatinine, it was recommended she only take her Lasix on an as-needed basis.  A generic Chantix was sent for refill.  She reported Chantix through [facility] was too expensive.  She had chronic and stable cough with consideration of ongoing tobacco use and COPD.  She reported worsening leg weakness over the last 2 to 3 months.  She felt as if her legs gave out more cold and numb.  It was noted that it was difficult to differentiate between neuropathy versus PAD.  Aortoiliac and lower extremity Dopplers were ordered.  She was seen in office 06/2021 for PAD and 09/27/2020 for follow-up regarding PAD.  At that time, she continued to smoke 1 pack/day.  It was noted that she  recently reported severe left calf claudication with some rest pain at night.  Vascular studies showed ABI of 1.08 on the right and 0.63 on the left with absent toe pressure.  Duplex without significant aortic iliac disease.  There is evidence of subtotal occlusion in the distal left SFA.  It was noted that last month, Dr. Fletcher Anon had proceeded with angiography with no significant aorto iliac disease.  Successful orbital atherectomy and drug-coated balloon angioplasty was performed to the left SFA.  Post procedure ABI improved to normal with patent left SFA.  She reported complete resolution of left calf claudication after that time and was doing well without any chest pain or shortness of breath.  Repeat vascular studies were recommended in 6 months.  BP was mildly elevated with recommendation to consider another antihypertensive medication in the near future if needed.  Escalation of carvedilol was precluded by baseline bradycardia.  Smoking cessation was advised.  Today, 01/04/2021, she returns to clinic  and notes increasing frequency and severity of episodes of presyncope.  Specifically, over the last 2 weeks, she reports "black outs."  No syncope or falls.  Today, she describes these episodes as occurring when she stands up and then walks the length of a room from the couch to the door.  She reports her vision begins to darken and she ultimately falls into the nearest chair or couch, so that she does not hit the floor and injure herself.  She does admit that she stands up rather quickly, though she states that the standing action is not what gives her the feeling of presyncope, but rather standing and walking.  She feels unsteady on her feet.  She often runs into door frames.  Of note, claudication symptoms remain improved s/p her intervention as described above.  She reports poor hydration and that she does not like drinking water.  Several solutions discussed, including drinking herbal tea, with patient  agreeable to try herbal tea moving forward.  She reports tachypalpitations and often feels her heart racing and pounding in her chest.  She feels exhausted all of the time and will nap almost every day.  She denies any chest pain or symptoms similar to that before her cardiac interventions as described above.  No chest pain or shortness of breath at rest or with exertion.  No nausea, emesis, or diarrhea.  She denies any signs or symptoms of volume overload.  A1c discussed with patient report that it has actually improved from 8.5.  Recent thyroid labs also discussed with patient report that she is struggled to find the correct dose of her thyroid medication and has also seen several endocrinologist without help.  She reports ongoing tobacco use at 1 pack/day.  We reviewed several smoking cessation tactics with patient preference to use Chantix.  She does not wish to try nicotine patches.  She does have history of apnea but has not tolerated a CPAP.  She notes her blood pressure has been running high at home and wonders the reason for this with diastolic near 109 at times.  BP today 176/100.  Weight 154.2 pounds with previous clinic weight 155 pounds (09/2020).  No signs or symptoms of bleeding.  Reds vest today 37%.  Home Medications   Current Outpatient Medications  Medication Instructions   albuterol (VENTOLIN HFA) 108 (90 Base) MCG/ACT inhaler 2 puffs, Inhalation, Every 6 hours PRN   amitriptyline (ELAVIL) 50 mg, Oral, At bedtime PRN   carvedilol (COREG) 6.25 mg, Oral, 2 times daily   clopidogrel (PLAVIX) 75 mg, Oral, Daily   enalapril (VASOTEC) 20 mg, Oral, Daily   ezetimibe (ZETIA) 10 mg, Oral, Daily, For cholesterol.   furosemide (LASIX) 20 mg, Oral, Daily PRN   gabapentin (NEURONTIN) 300 mg, Oral, 3 times daily   glucose blood (FREESTYLE LITE) test strip Test up to four times a day as needed. DX   insulin aspart (NOVOLOG FLEXPEN) 100 UNIT/ML FlexPen Inject 22 units with breakfast and lunch,  inject 14 units with dinner.   Insulin Pen Needle (INSUPEN PEN NEEDLES) 32G X 4 MM MISC Use to inject insulin 4 times daily.   isosorbide mononitrate (IMDUR) 30 mg, Oral, Daily   Lantus SoloStar 60 Units, Subcutaneous, Daily at bedtime   levothyroxine (SYNTHROID) 100 MCG tablet Take 1 tablet by mouth on an empty stomach with water only on Sunday, Monday, and Tuesday. No food or other medications for 30 minutes.   levothyroxine (SYNTHROID) 88 MCG tablet Take 1 tablet by mouth  every morning on an empty stomach with water only on Wednesday, Thursday, Friday, Saturday.  No food or other medications for 30 minutes.   neomycin-polymyxin b-dexamethasone (MAXITROL) 3.5-10000-0.1 SUSP 1 drop, Left Eye, 3 times daily   rosuvastatin (CRESTOR) 40 mg, Oral, Every evening, For cholesterol.   traZODone (DESYREL) 100 MG tablet TAKE 1 TABLET BY MOUTH AT BEDTIME AS NEEDED FOR SLEEP     Review of Systems    She reports presyncope when standing and walking, described as a darkening of her vision and feeling of instability.  She notes palpitations and elevated blood pressure. She denies chest pain, palpitations, dyspnea, pnd, orthopnea, n, v, syncope, edema, weight gain, or early satiety.   All other systems reviewed and are otherwise negative except as noted above.  Physical Exam    VS:  BP (!) 176/100   Pulse 70   Ht 5\' 7"  (1.702 m)   Wt 154 lb 3.2 oz (69.9 kg)   BMI 24.15 kg/m  , BMI Body mass index is 24.15 kg/m. GEN: Well nourished, well developed, in no acute distress. HEENT: normal. Neck: Supple, no JVD, left carotid bruit, no right carotid bruit, or masses. Cardiac: RRR, no murmurs, rubs, or gallops. No clubbing, cyanosis.mild bilateral nonpitting edema.  Radials/DP/PT 2+ and equal bilaterally.  Respiratory:  Respirations regular and unlabored, coarse breath sounds bilaterally, some bibasilar crackles. GI: Soft, nontender, nondistended, BS + x 4. MS: no deformity or atrophy. Skin: warm and dry, no  rash. Neuro:  Strength and sensation are intact. Psych: Normal affect.  Accessory Clinical Findings    ECG personally reviewed by me today -NSR, 72 bpm, prolonged PR interval at 2 4 ms/first-degree AV block, left bundle branch block (new) with QRS 138 ms, prolonged QTC at 501 ms, poor R wave progression V1 through V3, reviewed with DOD same day and before patient left the office- no acute changes.  VITALS Reviewed today   Temp Readings from Last 3 Encounters:  11/14/20 98.6 F (37 C) (Temporal)  09/12/20 98.8 F (37.1 C) (Oral)  09/10/20 (!) 97 F (36.1 C) (Temporal)   BP Readings from Last 3 Encounters:  01/04/21 (!) 176/100  11/14/20 134/78  09/27/20 (!) 156/84   Pulse Readings from Last 3 Encounters:  01/04/21 70  11/14/20 77  09/27/20 61    Wt Readings from Last 3 Encounters:  01/04/21 154 lb 3.2 oz (69.9 kg)  11/14/20 153 lb (69.4 kg)  09/27/20 155 lb (70.3 kg)     LABS  reviewed today   Lab Results  Component Value Date   WBC 8.4 08/30/2020   HGB 13.8 08/30/2020   HCT 41.5 08/30/2020   MCV 93 08/30/2020   PLT 242 08/30/2020   Lab Results  Component Value Date   CREATININE 1.15 (H) 08/30/2020   BUN 17 08/30/2020   NA 138 08/30/2020   K 4.2 08/30/2020   CL 102 08/30/2020   CO2 23 08/30/2020   Lab Results  Component Value Date   ALT 22 08/03/2020   AST 23 08/03/2020   ALKPHOS 68 08/03/2020   BILITOT 0.5 08/03/2020   Lab Results  Component Value Date   CHOL 102 08/03/2020   HDL 38.70 (L) 08/03/2020   LDLCALC 29 08/03/2020   LDLDIRECT 143.0 12/24/2018   TRIG 171.0 (H) 08/03/2020   CHOLHDL 3 08/03/2020    Lab Results  Component Value Date   HGBA1C 8.5 (A) 11/14/2020   Lab Results  Component Value Date   TSH 0.21 (L)  11/14/2020     STUDIES/PROCEDURES reviewed today   Echo 05/02/2020  1. Left ventricular ejection fraction, by estimation, is 50 to 55%. The  left ventricle has low normal function. The left ventricle has no regional  wall  motion abnormalities. There is mild left ventricular hypertrophy.  Left ventricular diastolic  parameters are consistent with Grade II diastolic dysfunction  (pseudonormalization).   2. Right ventricular systolic function is normal. The right ventricular  size is normal. Mildly increased right ventricular wall thickness.   3. The mitral valve is normal in structure. No evidence of mitral valve  regurgitation. No evidence of mitral stenosis.   4. The aortic valve is grossly normal. Aortic valve regurgitation is not  visualized. Mild aortic valve sclerosis is present, with no evidence of  aortic valve stenosis.   5. The inferior vena cava is normal in size with greater than 50%  respiratory variability, suggesting right atrial pressure of 3 mmHg.   Carotid study 05/02/2020 Summary:  Right Carotid: Velocities in the right ICA are consistent with a 1-39%  stenosis.                 Non-hemodynamically significant plaque <50% noted in the  CCA.                 The ECA appears <50% stenosed.   Left Carotid: Velocities in the left ICA are consistent with a 1-39%  stenosis.                Non-hemodynamically significant plaque <50% noted in the  CCA.                The ECA appears <50% stenosed.   Vertebrals:  Bilateral vertebral arteries demonstrate antegrade flow.  Subclavians: Normal flow hemodynamics were seen in bilateral subclavian               arteries.   01/01/2016 Left heart catheterization and coronary angiography Patent stents in the circumflex and RCA. There is severe left ventricular systolic dysfunction in a pattern of Takotsubo cardiomyopathy. Medical therapy.  Assessment & Plan    Presyncope --Reports episodes of presyncope when standing and then moving to walk across the room.  Orthostatic BP reading obtained today with findings not consistent with orthostasis.  In fact, patient is volume up today with recommendations for diuresis as below. Reviewed strict  recommendations for fluid and salt restrictions.  While fluid restrictions reviewed, hydration also encouraged.  Given known PAD and faint left carotid bruit, will also obtain carotid studies.  As below, we we will get an echo to reassess EF, valvular function, and rule out acute structural changes.  Discussed today that BP and volume status could be contributing to her presentation today, as well as known COPD and thyroid dysfunction.  TSH abnormal with recommendation for thyroid control as well to reduce episodes.  Will recheck TSH, free T4.  Future considerations could include cardiac monitoring.  Will defer for now.  Recommended slow position changes and that she avoid driving until further work-up of presyncope.  Smoking cessation recommended.  Acute on chronic combined systolic and diastolic heart failure History of Takotsubo cardiomyopathy -- Volume up today, confirmed by Reds vest at 37%.  Previous echo as above with EF 50 to 55%, NR WMA, mild LVH, G2 DD.  Mild RVH and aortic sclerosis.  Previous history of Takotsubo cardiomyopathy with most recent 2017 Showing patent stents in the circumflex and RCA, as well as severe LV dysfunction consistent with  Takotsubo. She was continued on medical therapy.  She is volume overloaded today, which is contributing to her elevated BP.  Given volume overload, will get another echo to reassess EF, wall motion, and rule out acute structural changes or valvular abnormalities.  Will obtain BMET to ensure stable renal function and electrolytes today.  She is on Lasix 20 mg daily as needed for edema--we will increase her diuresis.  Recommend increase diuresis for 3 days with Lasix 20 mg daily for 3 days then drop down to usual as needed Lasix 20 mg.  As below, if BP remains elevated following this increased diuresis, additional antihypertensives medication changes recommended.  For now, continue current Coreg and enalapril, as well as Lasix with 3 days of increased dose  diuresis as above.  Discussed compression stockings and leg elevation.  Salt and fluid restrictions reviewed as below.  Coronary artery disease without angina --No anginal symptoms at this time.  Will get an echo as above and given her symptoms of presyncope and recent volume overload. In addition, will want to reassess EF and WM given EKG today with LBBB and reviewed with DOD same day before pt left office given her symptoms. Continue current medications including DAPT, Imdur, Coreg, Crestor, Zetia.  Aggressive risk factor modification recommended.  Optimize BP control recommended.  Glycemic and LDL /Tg control recommended as below. Complete smoking cessation recommended.  Lifestyle changes with diet and increased activity discussed.  Prolonged Qtc, EKG with LBBB --Qtc today prolonged at 501 ms with recommendation to avoid QT prolonging medications. Avoid antiemetics and allergy medicines. LBBB reviewed with DOD as above and given presyncope sx. Will get an echo as above to reassess EF and WM.   Essential hypertension, goal BP less than 130/80 --BP uncontrolled at 176/100.  Suspect some element of volume overload is contributing to her elevated BP.  Also noted was uncontrolled thyroid.  Fluid and salt restrictions reviewed with total daily fluids under 2 L/day and salt under 2 g/day.  Recommended she monitor her BP at home and contact the office if BP continues to be elevated after increased diuresis for 3 days (above).  If BP still elevated after 3 days of increased diuresis, and as previously recommended, consider additional antihypertensive medication such as amlodipine or thiazide diuretic.  Today, provided BP measurement guidelines with recommendation that she call our office for immediate recommendations if BP consistently over 335 systolic or 90 diastolic.  MyChart message can be sent for BP consistently over 130/80.  Of note, previously carvedilol was not increased due to baseline bradycardia.  She  is not bradycardic today, and at RTC, if ongoing room in heart rate, could consider escalation of Coreg.  For now, continue current Coreg, Imdur, enalapril, and Lasix with 3 days of increased dose as above.  Hyperlipidemia, goal LDL below 70 Hypertriglyceridemia --07/2020 total cholesterol 102, LDL 29, triglycerides 171.0.  LDL at goal of below 70.  Continue current Crestor 40 mg daily and Zetia 10 mg daily.  Consider addition of Vascepa at RTC for triglyceride control moving forward.  Peripheral arterial disease s/p recent intervention to left SFA --No recurrent claudication symptoms.  S/p recent orbital atherectomy and drug-coated balloon angioplasty to the left SFA with resolution of left calf claudication.  Recommend repeat vascular studies in 6 months.  Continue Crestor, Zetia, and DAPT with ASA and clopidogrel.  Mild bilateral carotid artery disease (R/LICA 1 to 45%, CCA with plaque <50%, ECA l<50% stenosed) --Reports presyncope.  No amaurosis fugax.  Previous  04/2020 carotids with 1 to 39% bilateral left and right internal carotid stenosis and nonhemodynamically significant plaque less than 50% noted in the common carotid with right and left external carotid less than 50% stenosed.  Does report darkening of vision with changes in position.  We will plan to update carotid studies given mild left carotid bruit as above.  Continue Crestor, Zetia, and DAPT.  Abnormal TSH Hypothyroidism --Abnormal TSH noted with patient report this has been ongoing for some time.  Most recent 10/2020 TSH 0.21.  Will recheck a TSH and free T4 today.  We discussed that this can contribute to elevated BP, heart rate, and exacerbation of volume status.  If ongoing abnormal TSH, consider endocrinology referral.  Will reach out to patient's PCP.  Continue current Synthroid.  Tobacco use, current COPD -- Smoking 1 pack/day.  Smoking cessation recommended.  Discussed several options to help her quit smoking with patient  report that Chantix is the only option that she is willing to try at this time.  Consider that COPD could be contributing to her symptoms listed above.  DM2 -- Most recent 10/2020 A1c 8.5.  Strict glycemic control recommended for cardiovascular/PAD RF modification.    Medication changes: 3 days of increased diuresis with Lasix 20 mg daily then as needed +/- antihypertensive changes as above or if she calls into the office with SBP consistently over 081 systolic or 90 diastolic as above. Labs ordered: BMET, TSH, free T4 Studies / Imaging ordered: Echo, carotids Future considerations: Additional antihypertensives or adjustment of current antihypertensives, reassessment of volume status Disposition: RTC after testing as above  *Please be aware that the above documentation was completed voice recognition software and may contain dictation errors.    Arvil Chaco, PA-C 01/04/2021

## 2021-01-04 NOTE — Patient Instructions (Signed)
Medication Instructions:  TAKE LASIX 20MG  DAILY FOR THREE DAYS THEN MAY RESUME TAKING DAILY AS NEEDED.  *If you need a refill on your cardiac medications before your next appointment, please call your pharmacy*   Lab Work: BMP, TSH, and Free T4  If you have labs (blood work) drawn today and your tests are completely normal, you will receive your results only by: [place] (if you have MyChart) OR A paper copy in the mail If you have any lab test that is abnormal or we need to change your treatment, we will call you to review the results.   Testing/Procedures: Your physician has requested that you have an echocardiogram. Echocardiography is a painless test that uses sound waves to create images of your heart. It provides your doctor with information about the size and shape of your heart and how well your heart's chambers and valves are working. This procedure takes approximately one hour. There are no restrictions for this procedure.  Your physician has requested that you have a carotid duplex. This test is an ultrasound of the carotid arteries in your neck. It looks at blood flow through these arteries that supply the brain with blood. Allow one hour for this exam. There are no restrictions or special instructions.    Follow-Up: At [facility], you and your health needs are our priority.  As part of our continuing mission to provide you with exceptional heart care, we have created designated Provider Care Teams.  These Care Teams include your primary Cardiologist (physician) and Advanced Practice Providers (APPs -  Physician Assistants and Nurse Practitioners) who all work together to provide you with the care you need, when you need it.  We recommend signing up for the patient portal called "MyChart".  Sign up information is provided on this After Visit Summary.  MyChart is used to connect with patients for Virtual Visits (Telemedicine).  Patients are able to view lab/test results,  encounter notes, upcoming appointments, etc.  Non-urgent messages can be sent to your provider as well.   To learn more about what you can do with MyChart, go to [email or web address].    Your next appointment:   F/U after testing  The format for your next appointment:   In Person  Provider:   You may see Ida Rogue, MD or one of the following Advanced Practice Providers on your designated Care Team:   Murray Hodgkins, NP Christell Faith, PA-C [name], PA-C Cadence Kathlen Mody, [place] Laurann Montana, NP   Other Instructions Please monitor your blood pressure and call our office if the top number is consistently higher than 160 and/or the bottom number is higher than 90.

## 2021-01-05 LAB — BASIC METABOLIC PANEL
BUN/Creatinine Ratio: 15 (ref 12–28)
BUN: 19 mg/dL (ref 8–27)
CO2: 25 mmol/L (ref 20–29)
Calcium: 9.2 mg/dL (ref 8.7–10.3)
Chloride: 100 mmol/L (ref 96–106)
Creatinine, Ser: 1.31 mg/dL — ABNORMAL HIGH (ref 0.57–1.00)
Glucose: 118 mg/dL — ABNORMAL HIGH (ref 65–99)
Potassium: 4.4 mmol/L (ref 3.5–5.2)
Sodium: 139 mmol/L (ref 134–144)
eGFR: 43 mL/min/{1.73_m2} — ABNORMAL LOW (ref >59)

## 2021-01-05 LAB — T4, FREE: Free T4: 1.34 ng/dL (ref 0.82–1.77)

## 2021-01-05 LAB — TSH: TSH: 1.14 u[IU]/mL (ref 0.450–4.500)

## 2021-01-06 ENCOUNTER — Encounter: Payer: Self-pay | Admitting: Physician Assistant

## 2021-01-08 ENCOUNTER — Other Ambulatory Visit
Admission: RE | Admit: 2021-01-08 | Discharge: 2021-01-08 | Disposition: A | Discharge status: Home / Self Care | Payer: Medicare PPO | Attending: Physician Assistant | Admitting: Physician Assistant

## 2021-01-08 ENCOUNTER — Telehealth: Payer: Self-pay | Admitting: *Deleted

## 2021-01-08 DIAGNOSIS — Z79899 Other long term (current) drug therapy: Secondary | ICD-10-CM

## 2021-01-08 DIAGNOSIS — I5022 Chronic systolic (congestive) heart failure: Secondary | ICD-10-CM | POA: Insufficient documentation

## 2021-01-08 LAB — BASIC METABOLIC PANEL
Anion gap: 7 (ref 5–15)
BUN: 28 mg/dL — ABNORMAL HIGH (ref 8–23)
CO2: 28 mmol/L (ref 22–32)
Calcium: 9.2 mg/dL (ref 8.9–10.3)
Chloride: 102 mmol/L (ref 98–111)
Creatinine, Ser: 1.31 mg/dL — ABNORMAL HIGH (ref 0.44–1.00)
GFR, Estimated: 43 mL/min — ABNORMAL LOW (ref >60)
Glucose, Bld: 190 mg/dL — ABNORMAL HIGH (ref 70–99)
Potassium: 3.6 mmol/L (ref 3.5–5.1)
Sodium: 137 mmol/L (ref 135–145)

## 2021-01-08 NOTE — Telephone Encounter (Signed)
-----   Message from Arvil Chaco, PA-C sent at 01/07/2021  1:07 PM EDT ----- Labs show --Thyroid function now WNL, which is reassuring given previous labs and good news given patient's concern for abnormal thyroid as contributing to her symptoms. --Kidney function bumped from 4 months ago with suspicion this is due to volume overload noted and significantly elevated pressures.  Recommend BP control and improvement in volume status.  Given we increased diuresis for 3 days, lets recheck a BMET in 1 week to ensure stable renal function and electrolytes if patient agreeable. --Potassium WNL at this time and before increased diuresis. --As recommended in clinic, she should call the office if BP consistently elevated above 160 on the top and 90 on the bottom.  Recommendations: Repeat BMET in 1 week.

## 2021-01-08 NOTE — Telephone Encounter (Signed)
Pt notified of lab results and provider's recc.  Voiced understanding. Pt will repeat BMET in 1 week at the [facility]. Lab orders placed.  Pt will contact office for BP consistently >160/90.

## 2021-01-09 NOTE — Addendum Note (Signed)
Addended by: Ernie Hew D on: 01/09/2021 04:01 PM   Modules accepted: Orders

## 2021-01-12 ENCOUNTER — Telehealth: Payer: Self-pay | Admitting: Physician Assistant

## 2021-01-12 ENCOUNTER — Other Ambulatory Visit: Payer: Self-pay | Admitting: Physician Assistant

## 2021-01-12 DIAGNOSIS — I10 Essential (primary) hypertension: Secondary | ICD-10-CM

## 2021-01-12 DIAGNOSIS — I5033 Acute on chronic diastolic (congestive) heart failure: Secondary | ICD-10-CM

## 2021-01-12 DIAGNOSIS — E876 Hypokalemia: Secondary | ICD-10-CM

## 2021-01-12 MED ORDER — POTASSIUM CHLORIDE CRYS ER 10 MEQ PO TBCR: EXTENDED_RELEASE_TABLET | ORAL | 1 refills | Status: DC

## 2021-01-12 MED ORDER — SPIRONOLACTONE 25 MG PO TABS: 25.0000 mg | ORAL_TABLET | Freq: Every day | ORAL | 3 refills | Status: DC

## 2021-01-12 NOTE — Telephone Encounter (Signed)
Discussed plan to start spironolactone and low potassium on labs with KCL tab on days she takes her lasix.  She reports that she does not often take her lasix.  Repeat labs scheduled.

## 2021-01-16 ENCOUNTER — Ambulatory Visit
Admission: RE | Admit: 2021-01-16 | Discharge: 2021-01-16 | Disposition: A | Discharge status: Home / Self Care | Payer: Medicare PPO | Source: Ambulatory Visit | Attending: Student in an Organized Health Care Education/Training Program | Admitting: Student in an Organized Health Care Education/Training Program

## 2021-01-16 ENCOUNTER — Other Ambulatory Visit: Payer: Self-pay

## 2021-01-16 ENCOUNTER — Encounter: Payer: Self-pay | Admitting: Student in an Organized Health Care Education/Training Program

## 2021-01-16 ENCOUNTER — Ambulatory Visit (HOSPITAL_BASED_OUTPATIENT_CLINIC_OR_DEPARTMENT_OTHER): Payer: Medicare PPO | Admitting: Student in an Organized Health Care Education/Training Program

## 2021-01-16 VITALS — BP 154/101 | HR 70 | Temp 97.0°F | Resp 16 | Ht 67.0 in | Wt 154.0 lb

## 2021-01-16 DIAGNOSIS — G8929 Other chronic pain: Secondary | ICD-10-CM | POA: Insufficient documentation

## 2021-01-16 DIAGNOSIS — M4716 Other spondylosis with myelopathy, lumbar region: Secondary | ICD-10-CM | POA: Diagnosis not present

## 2021-01-16 DIAGNOSIS — M7918 Myalgia, other site: Secondary | ICD-10-CM | POA: Insufficient documentation

## 2021-01-16 DIAGNOSIS — Z981 Arthrodesis status: Secondary | ICD-10-CM | POA: Diagnosis not present

## 2021-01-16 DIAGNOSIS — M533 Sacrococcygeal disorders, not elsewhere classified: Secondary | ICD-10-CM | POA: Diagnosis not present

## 2021-01-16 DIAGNOSIS — G894 Chronic pain syndrome: Secondary | ICD-10-CM | POA: Insufficient documentation

## 2021-01-16 DIAGNOSIS — G629 Polyneuropathy, unspecified: Secondary | ICD-10-CM | POA: Diagnosis not present

## 2021-01-16 DIAGNOSIS — Z720 Tobacco use: Secondary | ICD-10-CM

## 2021-01-16 MED ORDER — GABAPENTIN 300 MG PO CAPS: 300.0000 mg | ORAL_CAPSULE | Freq: Three times a day (TID) | ORAL | 5 refills | Status: DC

## 2021-01-16 NOTE — Progress Notes (Signed)
Safety precautions to be maintained throughout the outpatient stay will include: orient to surroundings, keep bed in low position, maintain call bell within reach at all times, provide assistance with transfer out of bed and ambulation.  

## 2021-01-16 NOTE — Progress Notes (Signed)
PROVIDER NOTE: Information contained herein reflects review and annotations entered in association with encounter. Interpretation of such information and data should be left to medically-trained personnel. Information provided to patient can be located elsewhere in the medical record under "Patient Instructions". Document created using STT-dictation technology, any transcriptional errors that may result from process are unintentional.    Patient: Sarah Payne  Service Category: E/M  Provider: Gillis Santa, MD  DOB: [birth year]  DOS: 01/16/2021  Specialty: Interventional Pain Management  MRN: [id number]  Setting: Ambulatory outpatient  PCP: [name], NP  Type: Established Patient    Referring Provider: [name], NP  Location: Office  Delivery: Face-to-face     HPI  Sarah Payne, a 75 y.o. year old female, is here today because of her Lumbar spondylosis with myelopathy [M47.16]. Sarah Payne primary complain today is Back Pain (Entire back ) and Leg Pain (Bilateral ) Last encounter: My last encounter with her was on 06/19/20 Pertinent problems: Sarah Payne has CAD, multiple vessel; History of lumbar fusion; Diabetes mellitus type 2 with peripheral artery disease ([place]); Bronchitis, chronic obstructive ([place]); Obstructive sleep apnea; COPD, moderate ([place]); Lumbar degenerative disc disease; Lumbar spondylosis with myelopathy; Cannabis use disorder, mild, abuse; Peripheral polyneuropathy; Tobacco dependence; Chronic pain syndrome; Chronic neck pain; Chronic pain of right upper extremity; Cervical spondylosis; and Chronic musculoskeletal pain on their pertinent problem list. Pain Assessment: Severity of Chronic pain is reported as a 4 /10. Location: Back (legs bilaterally) Upper, Mid, Lower, Left, Right/denies. Onset: More than a month ago. Quality: Discomfort, Constant, Sharp. Timing: Constant. Modifying factor(s): gabapentin, advil and occassionally mobic at night. Vitals:  height is 5' 7"   (1.702 m) and weight is 154 lb (69.9 kg). Her temporal temperature is 97 F (36.1 C) (abnormal). Her blood pressure is 154/101 (abnormal) and her pulse is 70. Her respiration is 16 and oxygen saturation is 99%.   Reason for encounter: medication management.   Patient presents today for medication management, specifically refill of her gabapentin.  She states that she has been having increased lower back pain, right buttock, right hip pain that radiates into her posterior lateral thigh.  This is worse on the right side.  No inciting or traumatic event.  She takes amitriptyline as needed  We discussed bowel regimen and ways to reduce her risk of constipation.  She already utilizes a stool softener and we discussed the addition of MiraLAX if she goes more than 48 hours without having a bowel movement.  ROS  Constitutional: Denies any fever or chills Gastrointestinal: No reported hemesis, hematochezia, vomiting, or acute GI distress Musculoskeletal:  Low back pain Neurological: No reported episodes of acute onset apraxia, aphasia, dysarthria, agnosia, amnesia, paralysis, loss of coordination, or loss of consciousness  Medication Review  Insulin Pen Needle, albuterol, amitriptyline, carvedilol, clopidogrel, enalapril, ezetimibe, furosemide, gabapentin, glucose blood, insulin aspart, insulin glargine, isosorbide mononitrate, levothyroxine, meloxicam, neomycin-polymyxin b-dexamethasone, potassium chloride, rosuvastatin, spironolactone, and traZODone  History Review  Allergy: Sarah Payne is allergic to no known allergies. Drug: Sarah Payne  reports current drug use. Drug: Marijuana. Alcohol:  reports no history of alcohol use. Tobacco:  reports that she has been smoking cigarettes. She has a 45.00 pack-year smoking history. She has never used smokeless tobacco. Social: Sarah Payne  reports that she has been smoking cigarettes. She has a 45.00 pack-year smoking history. She has never used smokeless tobacco.  She reports current drug use. Drug: Marijuana. She reports that she does not drink alcohol. Medical:  has a past medical history of Acute systolic (congestive) heart failure ([place]) (01/01/2016), Cataract, Chronic low back pain, COPD (chronic obstructive pulmonary disease) ([place]), Coronary artery disease, Depression, Diabetes mellitus, Diverticulosis, Frequent PVCs, GERD (gastroesophageal reflux disease), Hyperlipidemia, Hypertension, Hypothyroidism, Impingement syndrome of left shoulder, Marijuana abuse, Myocardial infarction [facility]), Sleep apnea, Takotsubo cardiomyopathy, Tendonitis of left rotator cuff, Tobacco abuse, and Vascular dementia. Surgical: Sarah Payne  has a past surgical history that includes Back surgery; Appendectomy; Abdominal hysterectomy; Colonoscopy (N/A, 11/02/2014); Cataract extraction w/PHACO (Left, 01/30/2015); Cardiac catheterization (2013); Coronary angioplasty (2012); Coronary angioplasty with stent (2013); Cardiac catheterization (2016); Cataract extraction w/PHACO (Right, 02/13/2015); Cardiac catheterization (N/A, 01/01/2016); Esophagogastroduodenoscopy (egd) with propofol (N/A, 09/21/2018); Shoulder surgery (Left, 2017); ABDOMINAL AORTOGRAM W/LOWER EXTREMITY (N/A, 09/12/2020); PERIPHERAL VASCULAR ATHERECTOMY (Left, 09/12/2020); and PERIPHERAL VASCULAR BALLOON ANGIOPLASTY (09/12/2020). Family: family history includes Colon cancer in her sister; Heart attack in her mother; Heart disease in her father and mother; Liver cancer in her brother; Throat cancer in her brother.  Laboratory Chemistry Profile   Renal Lab Results  Component Value Date   BUN 28 (H) 01/08/2021   CREATININE 1.31 (H) 01/08/2021   BCR 15 01/04/2021   GFR 44.80 (L) 08/03/2020   GFRAA 55 (L) 08/30/2020   GFRNONAA 43 (L) 01/08/2021     Hepatic Lab Results  Component Value Date   AST 23 08/03/2020   ALT 22 08/03/2020   ALBUMIN 4.2 08/03/2020   ALKPHOS 68 08/03/2020   HCVAB NEGATIVE 03/12/2015   AMYLASE 32 04/11/2016    LIPASE 55.0 04/11/2016     Electrolytes Lab Results  Component Value Date   NA 137 01/08/2021   K 3.6 01/08/2021   CL 102 01/08/2021   CALCIUM 9.2 01/08/2021   MG 1.1 (L) 06/12/2014     Bone Lab Results  Component Value Date   VD25OH 41 02/25/2013     Inflammation (CRP: Acute Phase) (ESR: Chronic Phase) Lab Results  Component Value Date   CRP 2.1 04/11/2013   ESRSEDRATE 58 (H) 04/11/2013       Note: Above Lab results reviewed.  Recent Imaging Review  VAS [place] LOWER EXTREMITY ARTERIAL DUPLEX LOWER EXTREMITY ARTERIAL DUPLEX STUDY  Indications: Peripheral artery disease.  High Risk Factors: Hypertension, hyperlipidemia, Diabetes, current smoker,                    coronary artery disease.  Other Factors: Patient states that since her procedure, she has no more left leg                claudication symptoms.  Vascular Interventions: 09/12/2020-Successful orbital atherectomy and drug-coated                         balloon angioplasty to the left SFA. Current ABI:            right 1.04; Left 1.08  Comparison Study: 08/23/2020-75-99% stenosis noted in the superficial femoral                   artery. Total occlusion of the mid-distal SFA could not be                   ruled-out due to calcified plaque.  Performing Technologist: Wilkie Aye RVT    Examination Guidelines: A complete evaluation includes B-mode imaging, spectral Doppler, color Doppler, and power Doppler as needed of all accessible portions of each vessel. Bilateral testing is considered an integral part of a complete examination.  Limited examinations for reoccurring indications may be performed as noted.        +----------+--------+-----+--------+--------+--------+ LEFT      PSV cm/sRatioStenosisWaveformComments +----------+--------+-----+--------+--------+--------+ CFA Prox  102                  biphasic         +----------+--------+-----+--------+--------+--------+ DFA       82                    biphasic         +----------+--------+-----+--------+--------+--------+ SFA Prox  91                   biphasic         +----------+--------+-----+--------+--------+--------+ SFA Mid   93                   biphasic         +----------+--------+-----+--------+--------+--------+ SFA Distal153                  biphasic         +----------+--------+-----+--------+--------+--------+ POP Prox  62                   biphasic         +----------+--------+-----+--------+--------+--------+ POP Distal89                   biphasic         +----------+--------+-----+--------+--------+--------+ TP Trunk  69                   biphasic         +----------+--------+-----+--------+--------+--------+    Left: Marked improvement is noted compared to previous study. Atherosclerosis in the common femoral, femoral and popliteal arteries. No signficiant stenosis in the SFA, s/p atherectomy.    See table(s) above for measurements and observations.  Suggest follow up study in 6 months.  Electronically signed by Carlyle Dolly MD on 09/19/2020 at 9:42:28 AM.       Final   VAS [place] ABI WITH/WO TBI LOWER EXTREMITY DOPPLER STUDY  Indications: Peripheral artery disease.  High Risk Factors: Hypertension, hyperlipidemia, Diabetes, current smoker,                    coronary artery disease.  Other Factors: Patient states she has no left leg claudication symptoms since                her procedure.  Vascular Interventions: Successful orbital atherectomy and drug-coated balloon                         angioplasty to the left SFA.  Comparison Study: 08/23/20- Right ABI 1.08; Left ABi 0.63  Performing Technologist: Wilkie Aye RVT    Examination Guidelines: A complete evaluation includes at minimum, Doppler waveform signals and systolic blood pressure reading at the level of bilateral brachial, anterior tibial, and posterior tibial arteries, when  vessel segments are accessible. Bilateral testing is considered an integral part of a complete examination. Photoelectric Plethysmograph (PPG) waveforms and toe systolic pressure readings are included as required and additional duplex testing as needed. Limited examinations for reoccurring indications may be performed as noted.    ABI Findings: +---------+------------------+-----+----------+--------+ Right    Rt Pressure (mmHg)IndexWaveform  Comment  +---------+------------------+-----+----------+--------+ Brachial 148                                       +---------+------------------+-----+----------+--------+  ATA      151               1.02 biphasic           +---------+------------------+-----+----------+--------+ PTA      154               1.04 biphasic           +---------+------------------+-----+----------+--------+ PERO     151               1.02 monophasic         +---------+------------------+-----+----------+--------+ Great Toe118               0.80 Normal             +---------+------------------+-----+----------+--------+  +---------+------------------+-----+--------+-------+ Left     Lt Pressure (mmHg)IndexWaveformComment +---------+------------------+-----+--------+-------+ Brachial 145                                    +---------+------------------+-----+--------+-------+ ATA      160               1.08 biphasic        +---------+------------------+-----+--------+-------+ PTA      154               1.04 biphasic        +---------+------------------+-----+--------+-------+ PERO     146               0.99 biphasic        +---------+------------------+-----+--------+-------+ Great Toe98                0.66 Normal          +---------+------------------+-----+--------+-------+  +-------+-----------+-----------+------------+------------+ ABI/TBIToday's ABIToday's TBIPrevious ABIPrevious  TBI +-------+-----------+-----------+------------+------------+ Right  1.04       0.80       1.08        0.67         +-------+-----------+-----------+------------+------------+ Left   1.08       0.66       0.63        absent       +-------+-----------+-----------+------------+------------+     Right ABIs appear essentially unchanged compared to prior study on 08/2020. Left ABIs and TBIs appear increased compared to prior study on 08/2020.   Summary: Right: Resting right ankle-brachial index is within normal range. No evidence of significant right lower extremity arterial disease. The right toe-brachial index is normal.  Left: Resting left ankle-brachial index is within normal range. No evidence of significant left lower extremity arterial disease. The left toe-brachial index is abnormal.    *See table(s) above for measurements and observations.   Suggest follow up study in 6 months.  Electronically signed by Carlyle Dolly MD on 09/19/2020 at 9:06:36 AM.       Final   Note: Reviewed        Physical Exam  General appearance: Well nourished, well developed, and well hydrated. In no apparent acute distress Mental status: Alert, oriented x 3 (person, place, & time)       Respiratory: No evidence of acute respiratory distress Eyes: PERLA Vitals: BP (!) 154/101 (BP Location: Left Arm, Patient Position: Sitting, Cuff Size: Normal)   Pulse 70   Temp (!) 97 F (36.1 C) (Temporal)   Resp 16   Ht 5' 7"  (1.702 m)   Wt 154 lb (69.9 kg)   SpO2 99%   BMI 24.12 kg/m  BMI: Estimated body mass index is 24.12 kg/m as calculated from the following:   Height as of this encounter: 5' 7"  (1.702 m).   Weight as of this encounter: 154 lb (69.9 kg). Ideal: Ideal body weight: 61.6 kg (135 lb 12.9 oz) Adjusted ideal body weight: 64.9 kg (143 lb 1.3 oz)  Lumbar Spine Area Exam  Skin & Axial Inspection: No masses, redness, or swelling Alignment: Symmetrical Functional ROM: Pain  restricted ROM       Stability: No instability detected Muscle Tone/Strength: Functionally intact. No obvious neuro-muscular anomalies detected. Sensory (Neurological): Musculoskeletal pain pattern  Lower Extremity Exam    Side: Right lower extremity  Side: Left lower extremity  Stability: No instability observed          Stability: No instability observed          Skin & Extremity Inspection: Skin color, temperature, and hair growth are WNL. No peripheral edema or cyanosis. No masses, redness, swelling, asymmetry, or associated skin lesions. No contractures.  Skin & Extremity Inspection: Skin color, temperature, and hair growth are WNL. No peripheral edema or cyanosis. No masses, redness, swelling, asymmetry, or associated skin lesions. No contractures.  Functional ROM: Pain restricted ROM of the right hip joint, right SI joint                  Functional ROM: Unrestricted ROM                  Muscle Tone/Strength: Functionally intact. No obvious neuro-muscular anomalies detected.  Muscle Tone/Strength: Functionally intact. No obvious neuro-muscular anomalies detected.  Sensory (Neurological): Musculoskeletal pain pattern        Sensory (Neurological): Unimpaired        DTR: Patellar: deferred today Achilles: deferred today Plantar: deferred today  DTR: Patellar: deferred today Achilles: deferred today Plantar: deferred today  Palpation: No palpable anomalies  Palpation: No palpable anomalies    Assessment   Status Diagnosis  Controlled Controlled Controlled 1. Lumbar spondylosis with myelopathy   2. Peripheral polyneuropathy   3. Chronic pain syndrome   4. Chronic musculoskeletal pain      Plan of Care   [name] has a current medication list which includes the following long-term medication(s): albuterol, carvedilol, enalapril, ezetimibe, furosemide, novolog flexpen, lantus solostar, isosorbide mononitrate, levothyroxine, levothyroxine, potassium chloride,  rosuvastatin, spironolactone, trazodone, and gabapentin.  Pharmacotherapy (Medications Ordered): Meds ordered this encounter  Medications   gabapentin (NEURONTIN) 300 MG capsule    Sig: Take 1 capsule (300 mg total) by mouth 3 (three) times daily.    Dispense:  90 capsule    Refill:  5   Given increased lower back, hip and buttock pain, recommend imaging studies as below.  Will call patient results.  Encourage patient to take amitriptyline nightly regularly.  Continue gabapentin as prescribed.  Orders Placed This Encounter  Procedures   DG Si Joints    Standing Status:   Future    Standing Expiration Date:   02/15/2021    Scheduling Instructions:     Imaging must be done as soon as possible. Inform patient that order will expire within 30 days and I will not renew it.    Order Specific Question:   Reason for Exam (SYMPTOM  OR DIAGNOSIS REQUIRED)    Answer:   Sacroiliac joint pain    Order Specific Question:   Preferred imaging location?    Answer:   [facility]    Order Specific Question:   Call  Results- Best Contact Number?    Answer:   [phone] ([facility])    Order Specific Question:   Release to patient    Answer:   Immediate   DG HIP UNILAT W OR W/O PELVIS 2-3 VIEWS RIGHT    Please describe any evidence of DJD, such as joint narrowing, asymmetry, cysts, or any anomalies in bone density, production, or erosion.    Standing Status:   Future    Standing Expiration Date:   02/15/2021    Scheduling Instructions:     Imaging must be done as soon as possible. Inform patient that order will expire within 30 days and I will not renew it.    Order Specific Question:   Reason for Exam (SYMPTOM  OR DIAGNOSIS REQUIRED)    Answer:   Sacroiliac joint pain    Order Specific Question:   Preferred imaging location?    Answer:   [facility]    Order Specific Question:   Call Results- Best Contact Number?    Answer:   [phone] ([facility])    Order Specific  Question:   Release to patient    Answer:   Immediate   DG HIP UNILAT W OR W/O PELVIS 2-3 VIEWS LEFT    Please describe any evidence of DJD, such as joint narrowing, asymmetry, cysts, or any anomalies in bone density, production, or erosion.    Standing Status:   Future    Standing Expiration Date:   02/15/2021    Scheduling Instructions:     Imaging must be done as soon as possible. Inform patient that order will expire within 30 days and I will not renew it.    Order Specific Question:   Reason for Exam (SYMPTOM  OR DIAGNOSIS REQUIRED)    Answer:   Sacroiliac joint pain    Order Specific Question:   Preferred imaging location?    Answer:   [facility]    Order Specific Question:   Call Results- Best Contact Number?    Answer:   [phone] ([facility])   DG Lumbar Spine Complete W/Bend    Patient presents with axial pain with possible radicular component.  In addition to any acute findings, please report on:  1. Facet (Zygapophyseal) joint DJD (Hypertrophy, space narrowing, subchondral sclerosis, and/or osteophyte formation) 2. DDD and/or IVDD (Loss of disc height, desiccation or "Black disc disease") 3. Pars defects 4. Spondylolisthesis, spondylosis, and/or spondyloarthropathies (include Degree/Grade of displacement in mm) 5. Vertebral body Fractures, including age (old, new/acute) 38. Modic Type Changes 7. Demineralization 8. Bone pathology 9. Central, Lateral Recess, and/or Foraminal Stenosis (include AP diameter of stenosis in mm) 10. Surgical changes (hardware type, status, and presence of fibrosis)  NOTE: Please specify level(s) and laterality. If applicable: Please indicate ROM and/or evidence of instability (>30mm displacement between flexion and extension views)    Standing Status:   Future    Standing Expiration Date:   02/15/2021    Scheduling Instructions:     Imaging must be done as soon as possible. Inform patient that order will expire within 30 days and I  will not renew it.    Order Specific Question:   Reason for Exam (SYMPTOM  OR DIAGNOSIS REQUIRED)    Answer:   Low back pain    Order Specific Question:   Preferred imaging location?    Answer:   [facility]    Order Specific Question:   Call Results- Best Contact Number?    Answer:   [phone] ([facility])    Order Specific Question:   Radiology Contrast Protocol - do NOT remove file path    Answer:   [email or web address]    Order Specific Question:   Release to patient    Answer:   Immediate     Smoking cessation instruction/counseling given:  counseled patient on the dangers of tobacco use, advised patient to stop smoking, and reviewed strategies to maximize success  Follow-up plan:   Return in about 6 months (around 07/18/2021) for Medication Management, in person.   Recent Visits No visits were found meeting these conditions. Showing recent visits within past 90 days and meeting all other requirements Today's Visits Date Type Provider Dept  01/16/21 Office Visit Gillis Santa, MD [facility]  Showing today's visits and meeting all other requirements Future Appointments No visits were found meeting these conditions. Showing future appointments within next 90 days and meeting all other requirements I discussed the assessment and treatment plan with the patient. The patient was provided an opportunity to ask questions and all were answered. The patient agreed with the plan and demonstrated an understanding of the instructions.  Patient advised to call back or seek an in-person evaluation if the symptoms or condition worsens.  Duration of encounter: 13mnutes.  Note by: BGillis Santa MD Date: 01/16/2021; Time: 1:30 PM

## 2021-01-18 ENCOUNTER — Telehealth: Payer: Self-pay

## 2021-01-18 NOTE — Chronic Care Management (AMB) (Addendum)
Chronic Care Management Pharmacy Assistant   Name: Sarah Payne  MRN: [id number] DOB: [birth year]  Reason for Encounter: Disease State  HTN   Recent office visits:  None since last CCM contact  Recent consult visits:  01/16/21 [facility] - Pain management - refilled Gabapentin 300 mg and encouraged patient to take amitriptyline nightly regularly 01/04/21 - Cardiology - No medication changes  Hospital visits 09/12/20 -  [facility] Admission for Abdominal aortagram with lower extremity   Medications: Outpatient Encounter Medications as of 01/18/2021  Medication Sig   albuterol (VENTOLIN HFA) 108 (90 Base) MCG/ACT inhaler Inhale 2 puffs into the lungs every 6 (six) hours as needed for wheezing or shortness of breath.   amitriptyline (ELAVIL) 50 MG tablet Take 50 mg by mouth at bedtime as needed for sleep.   carvedilol (COREG) 6.25 MG tablet Take 1 tablet (6.25 mg total) by mouth 2 (two) times daily.   clopidogrel (PLAVIX) 75 MG tablet Take 1 tablet (75 mg total) by mouth daily.   enalapril (VASOTEC) 20 MG tablet Take 1 tablet (20 mg total) by mouth daily.   ezetimibe (ZETIA) 10 MG tablet Take 1 tablet (10 mg total) by mouth daily. For cholesterol.   furosemide (LASIX) 20 MG tablet Take 1 tablet (20 mg total) by mouth daily as needed for edema.   gabapentin (NEURONTIN) 300 MG capsule Take 1 capsule (300 mg total) by mouth 3 (three) times daily.   glucose blood (FREESTYLE LITE) test strip Test up to four times a day as needed. DX   insulin aspart (NOVOLOG FLEXPEN) 100 UNIT/ML FlexPen Inject 22 units with breakfast and lunch, inject 14 units with dinner.   insulin glargine (LANTUS SOLOSTAR) 100 UNIT/ML Solostar Pen Inject 60 Units into the skin at bedtime.   Insulin Pen Needle (INSUPEN PEN NEEDLES) 32G X 4 MM MISC Use to inject insulin 4 times daily.   isosorbide mononitrate (IMDUR) 30 MG 24 hr tablet Take 1 tablet (30 mg total) by mouth daily.   levothyroxine (SYNTHROID) 100 MCG  tablet Take 1 tablet by mouth on an empty stomach with water only on Sunday, Monday, and Tuesday. No food or other medications for 30 minutes.   levothyroxine (SYNTHROID) 88 MCG tablet Take 1 tablet by mouth every morning on an empty stomach with water only on Wednesday, Thursday, Friday, Saturday.  No food or other medications for 30 minutes.   meloxicam (MOBIC) 15 MG tablet Take 15 mg by mouth daily.   neomycin-polymyxin b-dexamethasone (MAXITROL) 3.5-10000-0.1 SUSP Place 1 drop into the left eye 3 (three) times daily.   potassium chloride (KLOR-CON) 10 MEQ tablet Only take on days you take your lasix. Take 1 tablet (10 mEq total) by mouth for 1 dose on those days you take your lasix 20mg  pill.   rosuvastatin (CRESTOR) 40 MG tablet Take 1 tablet (40 mg total) by mouth every evening. For cholesterol.   spironolactone (ALDACTONE) 25 MG tablet Take 1 tablet (25 mg total) by mouth daily.   traZODone (DESYREL) 100 MG tablet TAKE 1 TABLET BY MOUTH AT BEDTIME AS NEEDED FOR SLEEP   No facility-administered encounter medications on file as of 01/18/2021.    Recent Office Vitals: BP Readings from Last 3 Encounters:  01/16/21 (!) 154/101  01/04/21 (!) 176/100  11/14/20 134/78   Pulse Readings from Last 3 Encounters:  01/16/21 70  01/04/21 70  11/14/20 77    Wt Readings from Last 3 Encounters:  01/16/21 154 lb (69.9 kg)  01/04/21 154 lb 3.2 oz (69.9 kg)  11/14/20 153 lb (69.4 kg)     Kidney Function Lab Results  Component Value Date/Time   CREATININE 1.31 (H) 01/08/2021 11:55 AM   CREATININE 1.31 (H) 01/04/2021 12:11 PM   CREATININE 1.06 (H) 04/04/2016 05:21 PM   CREATININE 1.24 08/02/2014 04:08 AM   CREATININE 1.33 (H) 08/01/2014 12:53 PM   CREATININE 1.07 11/24/2011 03:41 PM   GFR 44.80 (L) 08/03/2020 10:05 AM   GFRNONAA 43 (L) 01/08/2021 11:55 AM   GFRNONAA 46 (L) 08/02/2014 04:08 AM   GFRNONAA 47 (L) 08/20/2013 05:07 AM   GFRNONAA 55 (L) 11/24/2011 03:41 PM   GFRAA 55 (L)  08/30/2020 09:41 AM   GFRAA 56 (L) 08/02/2014 04:08 AM   GFRAA 55 (L) 08/20/2013 05:07 AM   GFRAA 64 11/24/2011 03:41 PM    BMP Latest Ref Rng & Units 01/08/2021 01/04/2021 08/30/2020  Glucose 70 - 99 mg/dL 190(H) 118(H) 215(H)  BUN 8 - 23 mg/dL 28(H) 19 17  Creatinine 0.44 - 1.00 mg/dL 1.31(H) 1.31(H) 1.15(H)  BUN/Creat Ratio 12 - 28 - 15 15  Sodium 135 - 145 mmol/L 137 139 138  Potassium 3.5 - 5.1 mmol/L 3.6 4.4 4.2  Chloride 98 - 111 mmol/L 102 100 102  CO2 22 - 32 mmol/L 28 25 23   Calcium 8.9 - 10.3 mg/dL 9.2 9.2 9.0   Current antihypertensive regimen:   Enalapril 20 mg  - take one tablet daily Carvedilol 6.25 mg - take one tablet twice daily  Adherence Review: Is the patient currently on ACE/ARB medication? No Does the patient have >5 day gap between last estimated fill dates? CPP to review   Star Rating Drugs: Medication:  Last Fill: Day Supply Rosuvastatin 40mg  01/04/21 90 Lantus    08/08/20 25 Novolog  11/27/20 90  Patient uses [facility] mail order  Follow-Up:  Pharmacist Review  [name], CPP notified  Avel Sensor, [place] Assistant [phone]  Attempted contact with [name] 3 times on 01/23/21,01/29/21,02/01/21. Unsuccessful outreach, will attempt again next month.    I have reviewed the care management and care coordination activities outlined in this encounter and I am certifying that I agree with the content of this note. No further action required.  [name], PharmD Clinical Pharmacist [facility] at [facility] [phone]

## 2021-01-22 NOTE — Patient Instructions (Signed)
Dear Sarah Payne,  Below is a summary of the goals we discussed during our follow up appointment on January 02, 2021. Please contact me anytime with questions or concerns.   Visit Information   Patient Care Plan: CCM Pharmacy Care Plan     Problem Identified: CHL AMB "PATIENT-SPECIFIC PROBLEM"      Long-Range Goal: Disease Management   Start Date: 09/26/2020  This Visit's Progress: On track  Priority: High  Note:    Current Barriers:  Unable to achieve control of diabetes and blood pressure   Pharmacist Clinical Goal(s):  Over the next 90 days, patient will achieve control of diabetes as evidenced by home BG log through collaboration with PharmD and provider.   Interventions: 1:1 collaboration with [name], NP regarding development and update of comprehensive plan of care as evidenced by provider attestation and co-signature Inter-disciplinary care team collaboration (see longitudinal plan of care) Comprehensive medication review performed; medication list updated in electronic medical record  Hypertension (BP goal <130/80) -Controlled - per last clinic reading -Current treatment: Enalapril 20 mg - 1 tablet daily Carvedilol 6.25mg  - 1 BID Isosorbide mononitrate 30 mg - 1 tablet daily -Medications previously tried: none  -Current home readings: none to report today -Reports some lightheadedness. Denies low BP when checked at home. Denies hypertensive symptoms -Educated on BP goals and benefits of medications for prevention of heart attack, stroke and kidney damage; -Counseled to monitor BP at home with symptoms, document, and provide log at future appointments -Recommended to continue current medication  Hyperlipidemia: (LDL goal < 70) -Controlled - LDL 29 -Current treatment: Rosuvastatin 40 mg - 1 tablet daily Zetia 10 mg - 1 tablet daily  -Medications previously tried: none -Recommended to continue current medication  Diabetes (A1c goal <7%) -Uncontrolled  - improving, A1c 8.5% 10/2020 -Current medications: Novolog flexpen - Inject 22 before breakfast and lunch and 14 before dinner Lantus solostar - Inject 60 units every evening  -Medications previously tried: none -Current home glucose readings:  June 5 - Fasting 137, After lunch - 265 (gave herself shot) - dropped to 69 before dinner, 184 bedtime (before nightime shot) June 6 - Fasting - 96 (only check that day) June  7 - Fasting 188, Bedtime 112 June 8 - Fasting 161, Bedtime "after nighttime shot" 69 (drank a Pepsi) June 9 - Fasting 240, none further checks documented June 10 - Fasting 118, before dinner 197 (gave herself a shot), 2 hours after dinner 53 June 11 - Before lunch 158, none further checks documented  June 12 - Fasting 169 June 13 - Fasting 158, After lunch 241 (missed insulin this morning) June 14 - Fasting 189 -Current meal patterns:  - Breakfast: slice of toast with butter or blackberry jam, coffee with splenda, french vanilla creamer, zero sugar.  - Lunch: 2-3 PM in afternoon, light lunch. If she goes out to eat, she takes her Novolog after she gets home. Eats out with sister once a week at the [employer] (salad bar). Encouraged to take her insulin with her to lunch. She may also pick up fast food for lunch. She reports minimal appetite. She denies much food intake.  - Evening meal: 6:30-7 PM - cooks meat and 2 vegetables 7 days a week. Last night she had chicken casserole with green beans. She loves potatoes.  - Snacks: about 2 days per week, she has 1/2 hershey bar - Drinks: water, coffee, sprite zero, diet pepsi.  -Recommended to continue current medication; Counseled to  take Novolog before meals to avoid hypoglycemia. Reviewed rule of 15. Reduce potatoes.   Patient Goals/Self-Care Activities Over the next 90 days, patient will:  - check glucose before meals and bedtime, document, and provide at future appointments -check blood pressure at least once weekly, call if  > 140/90 on repeat check  Follow Up Plan: CCM follow up in 3 months       Patient verbalizes understanding of instructions provided today and agrees to view in [place].   [name], PharmD Clinical Pharmacist [facility] at [facility] [phone]

## 2021-01-30 ENCOUNTER — Telehealth: Payer: Self-pay

## 2021-01-30 NOTE — Telephone Encounter (Signed)
Called patient and informed her that SI xray was negative and do look up piriformis release exercises and do at home. Spelled piriformis for patient. Patient with understanding.

## 2021-02-06 ENCOUNTER — Ambulatory Visit (INDEPENDENT_AMBULATORY_CARE_PROVIDER_SITE_OTHER): Payer: Medicare PPO

## 2021-02-06 DIAGNOSIS — Z0000 Encounter for general adult medical examination without abnormal findings: Secondary | ICD-10-CM

## 2021-02-06 NOTE — Progress Notes (Signed)
Subjective:   Sarah Payne is a 75 y.o. female who presents for Medicare Annual (Subsequent) preventive examination.  Review of Systems: N/A     I connected with the patient today by telephone and verified that I am speaking with the correct person using two identifiers. Location patient: home Location nurse: work Persons participating in the telephone visit: patient, nurse.   I discussed the limitations, risks, security and privacy concerns of performing an evaluation and management service by telephone and the availability of in person appointments. I also discussed with the patient that there may be a patient responsible charge related to this service. The patient expressed understanding and verbally consented to this telephonic visit.        Cardiac Risk Factors include: advanced age (>21men, >72 women);diabetes mellitus;hypertension     Objective:    Today's Vitals   There is no height or weight on file to calculate BMI.  Advanced Directives 02/06/2021 09/12/2020 09/21/2018 08/24/2018 01/22/2018 01/19/2018 11/17/2017  Does Patient Have a Medical Advance Directive? No No No No No No No  Does patient want to make changes to medical advance directive? No - Patient declined - - - - - -  Would patient like information on creating a medical advance directive? - Yes (MAU/Ambulatory/Procedural Areas - Information given) No - Patient declined No - Patient declined No - Patient declined No - Patient declined Yes (MAU/Ambulatory/Procedural Areas - Information given)    Current Medications (verified) Outpatient Encounter Medications as of 02/06/2021  Medication Sig   albuterol (VENTOLIN HFA) 108 (90 Base) MCG/ACT inhaler Inhale 2 puffs into the lungs every 6 (six) hours as needed for wheezing or shortness of breath.   amitriptyline (ELAVIL) 50 MG tablet Take 50 mg by mouth at bedtime as needed for sleep.   carvedilol (COREG) 6.25 MG tablet Take 1 tablet (6.25 mg total) by mouth 2 (two) times  daily.   clopidogrel (PLAVIX) 75 MG tablet Take 1 tablet (75 mg total) by mouth daily.   enalapril (VASOTEC) 20 MG tablet Take 1 tablet (20 mg total) by mouth daily.   ezetimibe (ZETIA) 10 MG tablet Take 1 tablet (10 mg total) by mouth daily. For cholesterol.   furosemide (LASIX) 20 MG tablet Take 1 tablet (20 mg total) by mouth daily as needed for edema.   gabapentin (NEURONTIN) 300 MG capsule Take 1 capsule (300 mg total) by mouth 3 (three) times daily.   glucose blood (FREESTYLE LITE) test strip Test up to four times a day as needed. DX   insulin aspart (NOVOLOG FLEXPEN) 100 UNIT/ML FlexPen Inject 22 units with breakfast and lunch, inject 14 units with dinner.   insulin glargine (LANTUS SOLOSTAR) 100 UNIT/ML Solostar Pen Inject 60 Units into the skin at bedtime.   Insulin Pen Needle (INSUPEN PEN NEEDLES) 32G X 4 MM MISC Use to inject insulin 4 times daily.   isosorbide mononitrate (IMDUR) 30 MG 24 hr tablet Take 1 tablet (30 mg total) by mouth daily.   levothyroxine (SYNTHROID) 100 MCG tablet Take 1 tablet by mouth on an empty stomach with water only on Sunday, Monday, and Tuesday. No food or other medications for 30 minutes.   levothyroxine (SYNTHROID) 88 MCG tablet Take 1 tablet by mouth every morning on an empty stomach with water only on Wednesday, Thursday, Friday, Saturday.  No food or other medications for 30 minutes.   meloxicam (MOBIC) 15 MG tablet Take 15 mg by mouth daily.   neomycin-polymyxin b-dexamethasone (MAXITROL) 3.5-10000-0.1 SUSP Place  1 drop into the left eye 3 (three) times daily.   potassium chloride (KLOR-CON) 10 MEQ tablet Only take on days you take your lasix. Take 1 tablet (10 mEq total) by mouth for 1 dose on those days you take your lasix 20mg  pill.   rosuvastatin (CRESTOR) 40 MG tablet Take 1 tablet (40 mg total) by mouth every evening. For cholesterol.   spironolactone (ALDACTONE) 25 MG tablet Take 1 tablet (25 mg total) by mouth daily.   traZODone (DESYREL) 100 MG  tablet TAKE 1 TABLET BY MOUTH AT BEDTIME AS NEEDED FOR SLEEP   No facility-administered encounter medications on file as of 02/06/2021.    Allergies (verified) No known allergies   History: Past Medical History:  Diagnosis Date   Acute systolic (congestive) heart failure (HCC) 01/01/2016   Cataract    BILATERAL REMOVED   Chronic low back pain    COPD (chronic obstructive pulmonary disease) (HCC)    Coronary artery disease    a. multiple PCIs to the mLCx. b. stent to dRCA 08/2011 in setting of NSTEMI. c. DES to prox-mid LCx for ISR 09/2011. d.  DESx2 to prox-mRCA 07/2012. e. Takotsubo event 12/2015 with patent stents.   Depression    Diabetes mellitus    Diverticulosis    Frequent PVCs    a. Noted in hospital 12/2015.   GERD (gastroesophageal reflux disease)    Hyperlipidemia    Hypertension    Hypothyroidism    Impingement syndrome of left shoulder    Marijuana abuse    Myocardial infarction (HCC)    x 5   Sleep apnea    mild-does not use cpap   Takotsubo cardiomyopathy    a. 12/2015 - nephew committed suicide 1 week prior, sister died the morning of presentation - initially called a STEMI; cath with patent stents. LVEF 25-30%.   Tendonitis of left rotator cuff    Tobacco abuse    Vascular dementia    Past Surgical History:  Procedure Laterality Date   ABDOMINAL AORTOGRAM W/LOWER EXTREMITY N/A 09/12/2020   Procedure: ABDOMINAL AORTOGRAM W/LOWER EXTREMITY;  Surgeon: Wellington Hampshire, MD;  Location: [place] [facility];  Service: Cardiovascular;  Laterality: N/A;   ABDOMINAL HYSTERECTOMY     APPENDECTOMY     BACK SURGERY     CARDIAC CATHETERIZATION  2013   CARDIAC CATHETERIZATION  2016   CARDIAC CATHETERIZATION N/A 01/01/2016   Procedure: Left Heart Cath and Coronary Angiography;  Surgeon: Jettie Booze, MD;  Location: [place] [facility];  Service: Cardiovascular;  Laterality: N/A;   CATARACT EXTRACTION W/PHACO Left 01/30/2015   Procedure: CATARACT EXTRACTION PHACO AND  INTRAOCULAR LENS PLACEMENT (IOC);  Surgeon: Birder Robson, MD;  Location: [facility];  Service: Ophthalmology;  Laterality: Left;  [place] 00:47    CATARACT EXTRACTION W/PHACO Right 02/13/2015   Procedure: CATARACT EXTRACTION PHACO AND INTRAOCULAR LENS PLACEMENT (IOC);  Surgeon: Birder Robson, MD;  Location: [facility];  Service: Ophthalmology;  Laterality: Right;  cassette lot # [id number] H [place]  00:29.9 AP  20.7 CDE  6.20   COLONOSCOPY N/A 11/02/2014   Procedure: COLONOSCOPY;  Surgeon: Inda Castle, MD;  Location: [place];  Service: Endoscopy;  Laterality: N/A;   CORONARY ANGIOPLASTY  2012   stent x 3    CORONARY ANGIOPLASTY WITH STENT PLACEMENT  2013   ESOPHAGOGASTRODUODENOSCOPY (EGD) WITH PROPOFOL N/A 09/21/2018   Procedure: ESOPHAGOGASTRODUODENOSCOPY (EGD) WITH PROPOFOL;  Surgeon: [name], MD;  Location: [facility];  Service: Gastroenterology;  Laterality: N/A;  PERIPHERAL VASCULAR ATHERECTOMY Left 09/12/2020   Procedure: PERIPHERAL VASCULAR ATHERECTOMY;  Surgeon: Wellington Hampshire, MD;  Location: [place] [facility];  Service: Cardiovascular;  Laterality: Left;   PERIPHERAL VASCULAR BALLOON ANGIOPLASTY  09/12/2020   Procedure: PERIPHERAL VASCULAR BALLOON ANGIOPLASTY;  Surgeon: Wellington Hampshire, MD;  Location: [place] [facility];  Service: Cardiovascular;;   SHOULDER SURGERY Left 2017   Family History  Problem Relation Age of Onset   Heart attack Mother        First MI @ 3 - Died @ 65   Heart disease Mother    Heart disease Father        Died @ 19   Throat cancer Brother    Liver cancer Brother    Colon cancer Sister    Social History   Socioeconomic History   Marital status: Married    Spouse name: Not on file   Number of children: Not on file   Years of education: Not on file   Highest education level: Not on file  Occupational History   Not on file  Tobacco Use   Smoking status: Every Day    Packs/day: 1.00    Years: 45.00    Pack years: 45.00    Types:  Cigarettes   Smokeless tobacco: Never   Tobacco comments:    Has cut back, trying to quit.   Vaping Use   Vaping Use: Never used  Substance and Sexual Activity   Alcohol use: No    Alcohol/week: 0.0 standard drinks   Drug use: Yes    Types: Marijuana    Comment: last noc   Sexual activity: Not on file  Other Topics Concern   Not on file  Social History Narrative   Lives at home with her husband in [place].  Previously used marijuana - quit.      Regular exercise: no/ pain from a frozen rotator cuff   Caffeine use: coffee daily and pepsi      Does not have a living will.   Daughters and husband know her wishes- would desire CPR but not prolonged life support if futile   Social Determinants of Health   Financial Resource Strain: Low Risk    Difficulty of Paying Living Expenses: Not hard at all  Food Insecurity: No Food Insecurity   Worried About Charity fundraiser in the Last Year: Never true   [place] in the Last Year: Never true  Transportation Needs: No Transportation Needs   Lack of Transportation (Medical): No   Lack of Transportation (Non-Medical): No  Physical Activity: Inactive   Days of Exercise per Week: 0 days   Minutes of Exercise per Session: 0 min  Stress: No Stress Concern Present   Feeling of Stress : Not at all  Social Connections: Not on file    Tobacco Counseling Ready to quit: Not Answered Counseling given: Not Answered Tobacco comments: Has cut back, trying to quit.    Clinical Intake:  Pre-visit preparation completed: Yes  Pain : 0-10 Pain Type: Chronic pain Pain Location: Back Pain Descriptors / Indicators: Aching Pain Onset: More than a month ago Pain Frequency: Intermittent     Nutritional Risks: None Diabetes: Yes CBG done?: No Did pt. bring in CBG monitor from home?: No  How often do you need to have someone help you when you read instructions, pamphlets, or other written materials from your doctor or pharmacy?: 1  - Never  Diabetic: Yes Nutrition Risk Assessment:  Has the patient had any N/V/D within the last 2 months?  No  Does the patient have any non-healing wounds?  No  Has the patient had any unintentional weight loss or weight gain?  No   Diabetes:  Is the patient diabetic?  Yes  If diabetic, was a CBG obtained today?  No  telephone visit  Did the patient bring in their glucometer from home?  No  telephone visit  How often do you monitor your CBG's? daily.   Financial Strains and Diabetes Management:  Are you having any financial strains with the device, your supplies or your medication? No .  Does the patient want to be seen by Chronic Care Management for management of their diabetes?  No  Would the patient like to be referred to a Nutritionist or for Diabetic Management?  No   Diabetic Exams:  Diabetic Eye Exam: Completed 10/10/2020 Diabetic Foot Exam: Overdue, Pt has been advised about the importance in completing this exam. Pt is scheduled for diabetic foot exam on 02/15/2021.   Interpreter Needed?: No  Information entered by :: CJohnson, RN   Activities of Daily Living In your present state of health, do you have any difficulty performing the following activities: 02/06/2021 09/12/2020  Hearing? N N  Vision? N N  Difficulty concentrating or making decisions? Y N  Comment has memory loss -  Walking or climbing stairs? N Y  Dressing or bathing? N N  Doing errands, shopping? N -  Preparing Food and eating ? N -  Using the Toilet? N -  In the past six months, have you accidently leaked urine? Y -  Comment Patient wears a liner -  Do you have problems with loss of bowel control? N -  Managing your Medications? N -  Managing your Finances? N -  Housekeeping or managing your Housekeeping? N -  Some recent data might be hidden    Patient Care Team: [name], NP as PCP - General (Internal Medicine) Rockey Situ Kathlene November, MD as PCP - Cardiology (Cardiology) Minna Merritts, MD (Cardiology) Inda Castle, MD (Inactive) as Consulting Physician (Gastroenterology) Meade Maw, MD as Consulting Physician (Neurosurgery) Philemon Kingdom, MD as Consulting Physician (Internal Medicine) Minna Merritts, MD as Consulting Physician (Cardiology) [name], [facility] as Pharmacist (Pharmacist) Birder Robson, MD as Referring Physician (Ophthalmology)  Indicate any recent Medical Services you may have received from other than [facility] providers in the past year (date may be approximate).     Assessment:   This is a routine wellness examination for [place].  Hearing/Vision screen Vision Screening - Comments:: Patient gets annual eye exams   Dietary issues and exercise activities discussed: Current Exercise Habits: The patient does not participate in regular exercise at present, Exercise limited by: orthopedic condition(s)   Goals Addressed             This Visit's Progress    Patient Stated       02/06/2021, I will maintain and continue medications as prescribed.        Depression Screen PHQ 2/9 Scores 02/06/2021 04/02/2020 05/12/2018 04/15/2018 01/19/2018 11/17/2017 09/29/2017  PHQ - 2 Score 0 6 0 0 6 5 0  PHQ- 9 Score 0 12 - - 14 16 -    Fall Risk Fall Risk  02/06/2021 01/16/2021 06/19/2020 04/02/2020 08/23/2018  Falls in the past year? 0 0 0 0 0  Comment - - - - -  Number falls in past yr: 0 0 -  0 -  Injury with Fall? 0 - - 0 -  Risk for fall due to : Medication side effect;Impaired balance/gait No Fall Risks - Impaired balance/gait -  Follow up Falls evaluation completed;Falls prevention discussed Falls evaluation completed - - -    FALL RISK PREVENTION PERTAINING TO THE HOME:  Any stairs in or around the home? Yes  If so, are there any without handrails? No  Home free of loose throw rugs in walkways, pet beds, electrical cords, etc? Yes  Adequate lighting in your home to reduce risk of falls? Yes   ASSISTIVE DEVICES UTILIZED TO  PREVENT FALLS:  Life alert? No  Use of a cane, walker or w/c? No  Grab bars in the bathroom? No  Shower chair or bench in shower? No  Elevated toilet seat or a handicapped toilet? No   TIMED UP AND GO:  Was the test performed?  N/A telephone visit .    Cognitive Function: MMSE - Mini Mental State Exam 02/06/2021 11/17/2017  Not completed: Refused -  Orientation to time - 5  Orientation to Place - 5  Registration - 3  Attention/ Calculation - 0  Recall - 3  Language- name 2 objects - 0  Language- repeat - 1  Language- follow 3 step command - 3  Language- read & follow direction - 0  Write a sentence - 0  Copy design - 0  Total score - 20  Mini Cog  Mini-Cog screen was not completed. Patient refused. Maximum score is 22. A value of 0 denotes this part of the MMSE was not completed or the patient failed this part of the Mini-Cog screening.       Immunizations Immunization History  Administered Date(s) Administered   Fluad Quad(high Dose 65+) 04/02/2020   Influenza Split 05/20/2012   Influenza, High Dose Seasonal PF 04/17/2016, 04/17/2017, 05/04/2018   Influenza, Seasonal, Injecte, Preservative Fre 05/07/2006   Influenza,inj,Quad PF,6+ Mos 04/20/2013, 04/26/2014, 06/01/2015, 04/25/2019   PFIZER(Purple Top)SARS-COV-2 Vaccination 09/15/2019, 10/11/2019, 06/05/2020   Pneumococcal Conjugate-13 04/26/2014   Pneumococcal Polysaccharide-23 03/28/2013, 04/25/2019   Tdap 03/28/2013    TDAP status: Up to date  Flu Vaccine status: Up to date  Pneumococcal vaccine status: Up to date  Covid-19 vaccine status: Completed 3 vaccines  Qualifies for Shingles Vaccine? Yes   Zostavax completed No   Shingrix Completed?: No.    Education has been provided regarding the importance of this vaccine. Patient has been advised to call insurance company to determine out of pocket expense if they have not yet received this vaccine. Advised may also receive vaccine at local pharmacy or Health  Dept. Verbalized acceptance and understanding.  Screening Tests Health Maintenance  Topic Date Due   Zoster Vaccines- Shingrix (1 of 2) Never done   COVID-19 Vaccine (4 - Booster for Pfizer series) 09/05/2020   FOOT EXAM  01/31/2021   INFLUENZA VACCINE  02/18/2021   HEMOGLOBIN A1C  05/16/2021   OPHTHALMOLOGY EXAM  10/10/2021   TETANUS/TDAP  03/29/2023   COLONOSCOPY (Pts 45-23yrs Insurance coverage will need to be confirmed)  11/01/2024   DEXA SCAN  Completed   Hepatitis C Screening  Completed   PNA vac Low Risk Adult  Completed   HPV VACCINES  Aged Out    Health Maintenance  Health Maintenance Due  Topic Date Due   Zoster Vaccines- Shingrix (1 of 2) Never done   COVID-19 Vaccine (4 - Booster for [place] series) 09/05/2020   FOOT EXAM  01/31/2021  Colorectal cancer screening: Type of screening: Colonoscopy. Completed 11/02/2014. Repeat every 10 years  Mammogram status: declined  Bone Density status: Completed 01/05/2018. Results reflect: Bone density results: NORMAL. Repeat every 5 years.  Lung Cancer Screening: (Low Dose CT Chest recommended if Age 57-80 years, 30 pack-year currently smoking OR have quit w/in 15years.) does qualify.   Lung Cancer Screening Referral: declined  Additional Screening:  Hepatitis C Screening: does qualify; Completed 03/12/2015  Vision Screening: Recommended annual ophthalmology exams for early detection of glaucoma and other disorders of the eye. Is the patient up to date with their annual eye exam?  Yes  Who is the provider or what is the name of the office in which the patient attends annual eye exams?  Dr. [name] If pt is not established with a provider, would they like to be referred to a provider to establish care? No .   Dental Screening: Recommended annual dental exams for proper oral hygiene  Community Resource Referral / Chronic Care Management: CRR required this visit?  No   CCM required this visit?  No      Plan:     I  have personally reviewed and noted the following in the patient's chart:   Medical and social history Use of alcohol, tobacco or illicit drugs  Current medications and supplements including opioid prescriptions.  Functional ability and status Nutritional status Physical activity Advanced directives List of other physicians Hospitalizations, surgeries, and ER visits in previous 12 months Vitals Screenings to include cognitive, depression, and falls Referrals and appointments  In addition, I have reviewed and discussed with patient certain preventive protocols, quality metrics, and best practice recommendations. A written personalized care plan for preventive services as well as general preventive health recommendations were provided to patient.   Due to this being a telephonic visit, the after visit summary with patients personalized plan was offered to patient via office or my-chart. Patient preferred to pick up at office at next visit or via mychart.   Andrez Grime, LPN   [id number]

## 2021-02-06 NOTE — Patient Instructions (Addendum)
Sarah Payne , Thank you for taking time to come for your Medicare Wellness Visit. I appreciate your ongoing commitment to your health goals. Please review the following plan we discussed and let me know if I can assist you in the future.   Screening recommendations/referrals: Colonoscopy: Up to date, completed 11/02/2014, due 10/2024 Mammogram: declined Bone Density: Up to date, completed 01/05/2018, due in 5 years  Recommended yearly ophthalmology/optometry visit for glaucoma screening and checkup Recommended yearly dental visit for hygiene and checkup  Vaccinations: Influenza vaccine: Up to date, completed 03/23/2020, due 02/2021 Pneumococcal vaccine: Completed series Tdap vaccine: Up to date, completed 03/28/2013, due 03/2023 Shingles vaccine: due, check with your insurance regarding coverage if interested    Covid-19:completed 3 vaccines  Advanced directives: Advance directive discussed with you today. Even though you declined this today please call our office should you change your mind and we can give you the proper paperwork for you to fill out.   Conditions/risks identified: diabetes, hypertension   Next appointment: Follow up in one year for your annual wellness visit    Preventive Care 76 Years and Older, Female Preventive care refers to lifestyle choices and visits with your health care provider that can promote health and wellness. What does preventive care include? A yearly physical exam. This is also called an annual well check. Dental exams once or twice a year. Routine eye exams. Ask your health care provider how often you should have your eyes checked. Personal lifestyle choices, including: Daily care of your teeth and gums. Regular physical activity. Eating a healthy diet. Avoiding tobacco and drug use. Limiting alcohol use. Practicing safe sex. Taking low-dose aspirin every day. Taking vitamin and mineral supplements as recommended by your health care provider. What  happens during an annual well check? The services and screenings done by your health care provider during your annual well check will depend on your age, overall health, lifestyle risk factors, and family history of disease. Counseling  Your health care provider may ask you questions about your: Alcohol use. Tobacco use. Drug use. Emotional well-being. Home and relationship well-being. Sexual activity. Eating habits. History of falls. Memory and ability to understand (cognition). Work and work Statistician. Reproductive health. Screening  You may have the following tests or measurements: Height, weight, and BMI. Blood pressure. Lipid and cholesterol levels. These may be checked every 5 years, or more frequently if you are over 53 years old. Skin check. Lung cancer screening. You may have this screening every year starting at age 53 if you have a 30-pack-year history of smoking and currently smoke or have quit within the past 15 years. Fecal occult blood test (FOBT) of the stool. You may have this test every year starting at age 74. Flexible sigmoidoscopy or colonoscopy. You may have a sigmoidoscopy every 5 years or a colonoscopy every 10 years starting at age 36. Hepatitis C blood test. Hepatitis B blood test. Sexually transmitted disease (STD) testing. Diabetes screening. This is done by checking your blood sugar (glucose) after you have not eaten for a while (fasting). You may have this done every 1-3 years. Bone density scan. This is done to screen for osteoporosis. You may have this done starting at age 78. Mammogram. This may be done every 1-2 years. Talk to your health care provider about how often you should have regular mammograms. Talk with your health care provider about your test results, treatment options, and if necessary, the need for more tests. Vaccines  Your health care  provider may recommend certain vaccines, such as: Influenza vaccine. This is recommended every  year. Tetanus, diphtheria, and acellular pertussis (Tdap, Td) vaccine. You may need a Td booster every 10 years. Zoster vaccine. You may need this after age 48. Pneumococcal 13-valent conjugate (PCV13) vaccine. One dose is recommended after age 23. Pneumococcal polysaccharide (PPSV23) vaccine. One dose is recommended after age 65. Talk to your health care provider about which screenings and vaccines you need and how often you need them. This information is not intended to replace advice given to you by your health care provider. Make sure you discuss any questions you have with your health care provider. Document Released: 08/03/2015 Document Revised: 03/26/2016 Document Reviewed: 05/08/2015 Elsevier Interactive Patient Education  2017 [place] Prevention in the Home Falls can cause injuries. They can happen to people of all ages. There are many things you can do to make your home safe and to help prevent falls. What can I do on the outside of my home? Regularly fix the edges of walkways and driveways and fix any cracks. Remove anything that might make you trip as you walk through a door, such as a raised step or threshold. Trim any bushes or trees on the path to your home. Use bright outdoor lighting. Clear any walking paths of anything that might make someone trip, such as rocks or tools. Regularly check to see if handrails are loose or broken. Make sure that both sides of any steps have handrails. Any raised decks and porches should have guardrails on the edges. Have any leaves, snow, or ice cleared regularly. Use sand or salt on walking paths during winter. Clean up any spills in your garage right away. This includes oil or grease spills. What can I do in the bathroom? Use night lights. Install grab bars by the toilet and in the tub and shower. Do not use towel bars as grab bars. Use non-skid mats or decals in the tub or shower. If you need to sit down in the shower, use a  plastic, non-slip stool. Keep the floor dry. Clean up any water that spills on the floor as soon as it happens. Remove soap buildup in the tub or shower regularly. Attach bath mats securely with double-sided non-slip rug tape. Do not have throw rugs and other things on the floor that can make you trip. What can I do in the bedroom? Use night lights. Make sure that you have a light by your bed that is easy to reach. Do not use any sheets or blankets that are too big for your bed. They should not hang down onto the floor. Have a firm chair that has side arms. You can use this for support while you get dressed. Do not have throw rugs and other things on the floor that can make you trip. What can I do in the kitchen? Clean up any spills right away. Avoid walking on wet floors. Keep items that you use a lot in easy-to-reach places. If you need to reach something above you, use a strong step stool that has a grab bar. Keep electrical cords out of the way. Do not use floor polish or wax that makes floors slippery. If you must use wax, use non-skid floor wax. Do not have throw rugs and other things on the floor that can make you trip. What can I do with my stairs? Do not leave any items on the stairs. Make sure that there are handrails on both  sides of the stairs and use them. Fix handrails that are broken or loose. Make sure that handrails are as long as the stairways. Check any carpeting to make sure that it is firmly attached to the stairs. Fix any carpet that is loose or worn. Avoid having throw rugs at the top or bottom of the stairs. If you do have throw rugs, attach them to the floor with carpet tape. Make sure that you have a light switch at the top of the stairs and the bottom of the stairs. If you do not have them, ask someone to add them for you. What else can I do to help prevent falls? Wear shoes that: Do not have high heels. Have rubber bottoms. Are comfortable and fit you  well. Are closed at the toe. Do not wear sandals. If you use a stepladder: Make sure that it is fully opened. Do not climb a closed stepladder. Make sure that both sides of the stepladder are locked into place. Ask someone to hold it for you, if possible. Clearly mark and make sure that you can see: Any grab bars or handrails. First and last steps. Where the edge of each step is. Use tools that help you move around (mobility aids) if they are needed. These include: Canes. Walkers. Scooters. Crutches. Turn on the lights when you go into a dark area. Replace any light bulbs as soon as they burn out. Set up your furniture so you have a clear path. Avoid moving your furniture around. If any of your floors are uneven, fix them. If there are any pets around you, be aware of where they are. Review your medicines with your doctor. Some medicines can make you feel dizzy. This can increase your chance of falling. Ask your doctor what other things that you can do to help prevent falls. This information is not intended to replace advice given to you by your health care provider. Make sure you discuss any questions you have with your health care provider. Document Released: 05/03/2009 Document Revised: 12/13/2015 Document Reviewed: 08/11/2014 Elsevier Interactive Patient Education  2017 [employer]

## 2021-02-06 NOTE — Progress Notes (Signed)
PCP notes:  Health Maintenance: Shingrix- due Mammogram- declined Lung cancer screening- declined Foot exam- due    Abnormal Screenings: none   Patient concerns: Impaired balance/blackouts   Nurse concerns: none   Next PCP appt.: 02/15/2021 @ 8:40 am

## 2021-02-14 ENCOUNTER — Other Ambulatory Visit: Payer: Self-pay | Admitting: Physician Assistant

## 2021-02-14 DIAGNOSIS — R42 Dizziness and giddiness: Secondary | ICD-10-CM

## 2021-02-14 DIAGNOSIS — R55 Syncope and collapse: Secondary | ICD-10-CM

## 2021-02-15 ENCOUNTER — Encounter: Payer: Self-pay | Admitting: Primary Care

## 2021-02-15 ENCOUNTER — Ambulatory Visit (INDEPENDENT_AMBULATORY_CARE_PROVIDER_SITE_OTHER): Payer: Medicare PPO | Admitting: Primary Care

## 2021-02-15 ENCOUNTER — Other Ambulatory Visit: Payer: Self-pay | Admitting: Primary Care

## 2021-02-15 ENCOUNTER — Other Ambulatory Visit: Payer: Self-pay

## 2021-02-15 VITALS — BP 144/70 | HR 71 | Temp 98.2°F | Ht 65.75 in | Wt 154.0 lb

## 2021-02-15 DIAGNOSIS — D126 Benign neoplasm of colon, unspecified: Secondary | ICD-10-CM

## 2021-02-15 DIAGNOSIS — F4323 Adjustment disorder with mixed anxiety and depressed mood: Secondary | ICD-10-CM

## 2021-02-15 DIAGNOSIS — I5022 Chronic systolic (congestive) heart failure: Secondary | ICD-10-CM

## 2021-02-15 DIAGNOSIS — I25118 Atherosclerotic heart disease of native coronary artery with other forms of angina pectoris: Secondary | ICD-10-CM

## 2021-02-15 DIAGNOSIS — Z0000 Encounter for general adult medical examination without abnormal findings: Secondary | ICD-10-CM | POA: Diagnosis not present

## 2021-02-15 DIAGNOSIS — G629 Polyneuropathy, unspecified: Secondary | ICD-10-CM

## 2021-02-15 DIAGNOSIS — E2839 Other primary ovarian failure: Secondary | ICD-10-CM

## 2021-02-15 DIAGNOSIS — E782 Mixed hyperlipidemia: Secondary | ICD-10-CM

## 2021-02-15 DIAGNOSIS — Z720 Tobacco use: Secondary | ICD-10-CM

## 2021-02-15 DIAGNOSIS — Z23 Encounter for immunization: Secondary | ICD-10-CM | POA: Diagnosis not present

## 2021-02-15 DIAGNOSIS — E1151 Type 2 diabetes mellitus with diabetic peripheral angiopathy without gangrene: Secondary | ICD-10-CM | POA: Diagnosis not present

## 2021-02-15 DIAGNOSIS — K219 Gastro-esophageal reflux disease without esophagitis: Secondary | ICD-10-CM | POA: Diagnosis not present

## 2021-02-15 DIAGNOSIS — I5181 Takotsubo syndrome: Secondary | ICD-10-CM

## 2021-02-15 DIAGNOSIS — I10 Essential (primary) hypertension: Secondary | ICD-10-CM | POA: Diagnosis not present

## 2021-02-15 DIAGNOSIS — G894 Chronic pain syndrome: Secondary | ICD-10-CM

## 2021-02-15 DIAGNOSIS — E039 Hypothyroidism, unspecified: Secondary | ICD-10-CM

## 2021-02-15 DIAGNOSIS — J449 Chronic obstructive pulmonary disease, unspecified: Secondary | ICD-10-CM

## 2021-02-15 DIAGNOSIS — F32A Depression, unspecified: Secondary | ICD-10-CM

## 2021-02-15 DIAGNOSIS — G4700 Insomnia, unspecified: Secondary | ICD-10-CM

## 2021-02-15 DIAGNOSIS — Z1211 Encounter for screening for malignant neoplasm of colon: Secondary | ICD-10-CM

## 2021-02-15 DIAGNOSIS — Z0001 Encounter for general adult medical examination with abnormal findings: Secondary | ICD-10-CM | POA: Insufficient documentation

## 2021-02-15 LAB — POCT GLYCOSYLATED HEMOGLOBIN (HGB A1C): Hemoglobin A1C: 8.4 % — AB (ref 4.0–5.6)

## 2021-02-15 MED ORDER — FREESTYLE LIBRE 14 DAY SENSOR MISC: 1 refills | Status: DC

## 2021-02-15 MED ORDER — FREESTYLE LIBRE 14 DAY READER DEVI: 0 refills | Status: DC

## 2021-02-15 MED ORDER — ZOSTER VAC RECOMB ADJUVANTED 50 MCG/0.5ML IM SUSR: 0.5000 mL | Freq: Once | INTRAMUSCULAR | 1 refills | Status: AC

## 2021-02-15 NOTE — Assessment & Plan Note (Signed)
Denies concerns today.  Infrequent use of albuterol inhaler. Offered lung cancer screening for which she declines.

## 2021-02-15 NOTE — Assessment & Plan Note (Signed)
Well controlled according to labs from January 2022. Continue Crestor 40 mg and Zetia 10 mg.

## 2021-02-15 NOTE — Assessment & Plan Note (Signed)
She is compliant to both doses of levothyroxine and is taking correctly. Recent TSH reviewed from cardiology and is under good control.  Continue levothyroxine 100 mcg Sunday-Tuesday, and levothyroxine 88 mcg Wed-Sat.

## 2021-02-15 NOTE — Assessment & Plan Note (Signed)
Following with pain management. Continue current regimen.

## 2021-02-15 NOTE — Patient Instructions (Signed)
Take the shingles vaccine to the pharmacy.  You will be contacted regarding your referral to GI for the colonoscopy.  Please let us know if you have not been contacted within two weeks.   Eat a snack in between lunch and dinner to avoid drops in blood sugar. Keep working on your diet!  I sent the Freestyle Libre to your pharmacy.   Please schedule a follow up visit for 3 months for diabetes check.  It was a pleasure to see you today!  Preventive Care 73 Years and Older, Female Preventive care refers to lifestyle choices and visits with your health care provider that can promote health and wellness. This includes: A yearly physical exam. This is also called an annual wellness visit. Regular dental and eye exams. Immunizations. Screening for certain conditions. Healthy lifestyle choices, such as: Eating a healthy diet. Getting regular exercise. Not using drugs or products that contain nicotine and tobacco. Limiting alcohol use. What can I expect for my preventive care visit? Physical exam Your health care provider will check your: Height and weight. These may be used to calculate your BMI (body mass index). BMI is a measurement that tells if you are at a healthy weight. Heart rate and blood pressure. Body temperature. Skin for abnormal spots. Counseling Your health care provider may ask you questions about your: Past medical problems. Family's medical history. Alcohol, tobacco, and drug use. Emotional well-being. Home life and relationship well-being. Sexual activity. Diet, exercise, and sleep habits. History of falls. Memory and ability to understand (cognition). Work and work Statistician. Pregnancy and menstrual history. Access to firearms. What immunizations do I need?  Vaccines are usually given at various ages, according to a schedule. Your health care provider will recommend vaccines for you based on your age, medicalhistory, and lifestyle or other factors, such as  travel or where you work. What tests do I need? Blood tests Lipid and cholesterol levels. These may be checked every 5 years, or more often depending on your overall health. Hepatitis C test. Hepatitis B test. Screening Lung cancer screening. You may have this screening every year starting at age 54 if you have a 30-pack-year history of smoking and currently smoke or have quit within the past 15 years. Colorectal cancer screening. All adults should have this screening starting at age 73 and continuing until age 76. Your health care provider may recommend screening at age 1 if you are at increased risk. You will have tests every 1-10 years, depending on your results and the type of screening test. Diabetes screening. This is done by checking your blood sugar (glucose) after you have not eaten for a while (fasting). You may have this done every 1-3 years. Mammogram. This may be done every 1-2 years. Talk with your health care provider about how often you should have regular mammograms. Abdominal aortic aneurysm (AAA) screening. You may need this if you are a current or former smoker. BRCA-related cancer screening. This may be done if you have a family history of breast, ovarian, tubal, or peritoneal cancers. Other tests STD (sexually transmitted disease) testing, if you are at risk. Bone density scan. This is done to screen for osteoporosis. You may have this done starting at age 73 Talk with your health care provider about your test results, treatment options,and if necessary, the need for more tests. Follow these instructions at home: Eating and drinking  Eat a diet that includes fresh fruits and vegetables, whole grains, lean protein, and low-fat dairy products. Limit  your intake of foods with high amounts of sugar, saturated fats, and salt. Take vitamin and mineral supplements as recommended by your health care provider. Do not drink alcohol if your health care provider tells you not  to drink. If you drink alcohol: Limit how much you have to 0-1 drink a day. Be aware of how much alcohol is in your drink. In the [place] one drink equals one 12 oz bottle of beer (355 mL), one 5 oz glass of wine (148 mL), or one 1 oz glass of hard liquor (44 mL).  Lifestyle Take daily care of your teeth and gums. Brush your teeth every morning and night with fluoride toothpaste. Floss one time each day. Stay active. Exercise for at least 30 minutes 5 or more days each week. Do not use any products that contain nicotine or tobacco, such as cigarettes, e-cigarettes, and chewing tobacco. If you need help quitting, ask your health care provider. Do not use drugs. If you are sexually active, practice safe sex. Use a condom or other form of protection in order to prevent STIs (sexually transmitted infections). Talk with your health care provider about taking a low-dose aspirin or statin. Find healthy ways to cope with stress, such as: Meditation, yoga, or listening to music. Journaling. Talking to a trusted person. Spending time with friends and family. Safety Always wear your seat belt while driving or riding in a vehicle. Do not drive: If you have been drinking alcohol. Do not ride with someone who has been drinking. When you are tired or distracted. While texting. Wear a helmet and other protective equipment during sports activities. If you have firearms in your house, make sure you follow all gun safety procedures. What's next? Visit your health care provider once a year for an annual wellness visit. Ask your health care provider how often you should have your eyes and teeth checked. Stay up to date on all vaccines. This information is not intended to replace advice given to you by your health care provider. Make sure you discuss any questions you have with your healthcare provider. Document Revised: 06/27/2020 Document Reviewed: 07/01/2018 Elsevier Patient Education  2022 [employer].

## 2021-02-15 NOTE — Assessment & Plan Note (Signed)
Denies concerns today. Continue to monitor.  Not on treatment.

## 2021-02-15 NOTE — Assessment & Plan Note (Signed)
Overall stable today and also with log sheets she's bringing from home.   Continue enalapril 20 mg, carvedilol 6.25 mg BID, Imdur 30 mg, spironolactone 25 mg.   Recent labs from cardiology reviewed.

## 2021-02-15 NOTE — Assessment & Plan Note (Signed)
Overdue for repeat colonoscopy. Referral placed to GI.

## 2021-02-15 NOTE — Assessment & Plan Note (Signed)
Appears euvolemic today, using furosemide 20 mg PRN.  Continue carvedilol 6.25 mg BID, Imdur 30 mg, spironolactone 25 mg daily, furosemide 20 mg PRN.

## 2021-02-15 NOTE — Assessment & Plan Note (Signed)
A1C today of 8.4!! Improving but not at goal, discussed to keep working on diet.   She is experiencing episodes of hypoglycemia in between lunch and dinner once weekly so we will have her eat a healthy snack in between.   Continue Lantus 60 units nightly and Novolog 22 units with breakfast and lunch and 14 units with dinner.   Follow up in 3 months

## 2021-02-15 NOTE — Progress Notes (Signed)
Subjective:    Patient ID: Sarah Payne, female    DOB: 27-Mar-1946, 75 y.o.   MRN: [id number]  HPI  Sarah Payne is a very pleasant 75 y.o. female who presents today for complete physical and follow up of chronic conditions.  Immunizations: -Tetanus: 2014 -Influenza: Due this season  -Covid-19: 3 vaccines -Shingles: Never completed  -Pneumonia: Prevnar 13 in 2015, Pneumovax in 2020  Diet: Blue. She has cut back on Pepsi  Exercise: No regular exercise.  Eye exam: Completes annually  Dental exam: No recent visit   Mammogram: No recent imaging, declines  Dexa: No recent imaging Colonoscopy: Completed in 2016  Lung Cancer Screening: Smoked since the age of 47, is smoking 1 PPD. Never completed. Declines today.   BP Readings from Last 3 Encounters:  02/15/21 (!) 144/70  01/16/21 (!) 154/101  01/04/21 (!) 176/100       Review of Systems  Constitutional:  Negative for unexpected weight change.  HENT:  Negative for rhinorrhea.   Respiratory:  Negative for shortness of breath.   Cardiovascular:  Negative for chest pain.  Gastrointestinal:  Positive for constipation. Negative for diarrhea.  Genitourinary:  Negative for difficulty urinating.  Musculoskeletal:  Negative for arthralgias and myalgias.  Skin:  Negative for rash.  Allergic/Immunologic: Negative for environmental allergies.  Neurological:  Positive for dizziness. Negative for headaches.  Psychiatric/Behavioral:  The patient is not nervous/anxious.         Past Medical History:  Diagnosis Date   Acute systolic (congestive) heart failure ([place]) 01/01/2016   Cataract    BILATERAL REMOVED   Chronic low back pain    COPD (chronic obstructive pulmonary disease) (HCC)    Coronary artery disease    a. multiple PCIs to the mLCx. b. stent to dRCA 08/2011 in setting of NSTEMI. c. DES to prox-mid LCx for ISR 09/2011. d.  DESx2 to prox-mRCA 07/2012. e. Takotsubo event 12/2015 with patent stents.   Depression     Diabetes mellitus    Diverticulosis    Frequent PVCs    a. Noted in hospital 12/2015.   GERD (gastroesophageal reflux disease)    Hyperlipidemia    Hypertension    Hypothyroidism    Impingement syndrome of left shoulder    Marijuana abuse    Myocardial infarction (HCC)    x 5   Sleep apnea    mild-does not use cpap   Takotsubo cardiomyopathy    a. 12/2015 - nephew committed suicide 1 week prior, sister died the morning of presentation - initially called a STEMI; cath with patent stents. LVEF 25-30%.   Tendonitis of left rotator cuff    Tobacco abuse    Vascular dementia     Social History   Socioeconomic History   Marital status: Married    Spouse name: Not on file   Number of children: Not on file   Years of education: Not on file   Highest education level: Not on file  Occupational History   Not on file  Tobacco Use   Smoking status: Every Day    Packs/day: 1.00    Years: 45.00    Pack years: 45.00    Types: Cigarettes   Smokeless tobacco: Never   Tobacco comments:    Has cut back, trying to quit.   Vaping Use   Vaping Use: Never used  Substance and Sexual Activity   Alcohol use: No    Alcohol/week: 0.0 standard drinks   Drug use: Yes  Types: Marijuana    Comment: last noc   Sexual activity: Not on file  Other Topics Concern   Not on file  Social History Narrative   Lives at home with her husband in [place].  Previously used marijuana - quit.      Regular exercise: no/ pain from a frozen rotator cuff   Caffeine use: coffee daily and pepsi      Does not have a living will.   Daughters and husband know her wishes- would desire CPR but not prolonged life support if futile   Social Determinants of Health   Financial Resource Strain: Low Risk    Difficulty of Paying Living Expenses: Not hard at all  Food Insecurity: No Food Insecurity   Worried About Charity fundraiser in the Last Year: Never true   [place] in the Last Year: Never true   Transportation Needs: No Transportation Needs   Lack of Transportation (Medical): No   Lack of Transportation (Non-Medical): No  Physical Activity: Inactive   Days of Exercise per Week: 0 days   Minutes of Exercise per Session: 0 min  Stress: No Stress Concern Present   Feeling of Stress : Not at all  Social Connections: Not on file  Intimate Partner Violence: Not At Risk   Fear of Current or Ex-Partner: No   Emotionally Abused: No   Physically Abused: No   Sexually Abused: No    Past Surgical History:  Procedure Laterality Date   ABDOMINAL AORTOGRAM W/LOWER EXTREMITY N/A 09/12/2020   Procedure: ABDOMINAL AORTOGRAM W/LOWER EXTREMITY;  Surgeon: Wellington Hampshire, MD;  Location: [facility];  Service: Cardiovascular;  Laterality: N/A;   ABDOMINAL HYSTERECTOMY     APPENDECTOMY     BACK SURGERY     CARDIAC CATHETERIZATION  2013   CARDIAC CATHETERIZATION  2016   CARDIAC CATHETERIZATION N/A 01/01/2016   Procedure: Left Heart Cath and Coronary Angiography;  Surgeon: Jettie Booze, MD;  Location: [place] [facility];  Service: Cardiovascular;  Laterality: N/A;   CATARACT EXTRACTION W/PHACO Left 01/30/2015   Procedure: CATARACT EXTRACTION PHACO AND INTRAOCULAR LENS PLACEMENT (IOC);  Surgeon: Birder Robson, MD;  Location: [facility];  Service: Ophthalmology;  Laterality: Left;  [place] 00:47    CATARACT EXTRACTION W/PHACO Right 02/13/2015   Procedure: CATARACT EXTRACTION PHACO AND INTRAOCULAR LENS PLACEMENT (IOC);  Surgeon: Birder Robson, MD;  Location: [facility];  Service: Ophthalmology;  Laterality: Right;  cassette lot # [id number] H [id number]  00:29.9 AP  20.7 CDE  6.20   COLONOSCOPY N/A 11/02/2014   Procedure: COLONOSCOPY;  Surgeon: Inda Castle, MD;  Location: [place];  Service: Endoscopy;  Laterality: N/A;   CORONARY ANGIOPLASTY  2012   stent x 3    CORONARY ANGIOPLASTY WITH STENT PLACEMENT  2013   ESOPHAGOGASTRODUODENOSCOPY (EGD) WITH PROPOFOL N/A 09/21/2018   Procedure:  ESOPHAGOGASTRODUODENOSCOPY (EGD) WITH PROPOFOL;  Surgeon: [name], MD;  Location: [facility];  Service: Gastroenterology;  Laterality: N/A;   PERIPHERAL VASCULAR ATHERECTOMY Left 09/12/2020   Procedure: PERIPHERAL VASCULAR ATHERECTOMY;  Surgeon: Wellington Hampshire, MD;  Location: [place] [facility];  Service: Cardiovascular;  Laterality: Left;   PERIPHERAL VASCULAR BALLOON ANGIOPLASTY  09/12/2020   Procedure: PERIPHERAL VASCULAR BALLOON ANGIOPLASTY;  Surgeon: Wellington Hampshire, MD;  Location: [place] [facility];  Service: Cardiovascular;;   SHOULDER SURGERY Left 2017    Family History  Problem Relation Age of Onset   Heart attack Mother  First MI @ 60 - Died @ 67   Heart disease Mother    Heart disease Father        Died @ 59   Throat cancer Brother    Liver cancer Brother    Colon cancer Sister     Allergies  Allergen Reactions   No Known Allergies     Current Outpatient Medications on File Prior to Visit  Medication Sig Dispense Refill   albuterol (VENTOLIN HFA) 108 (90 Base) MCG/ACT inhaler Inhale 2 puffs into the lungs every 6 (six) hours as needed for wheezing or shortness of breath. 8 g 0   amitriptyline (ELAVIL) 50 MG tablet Take 50 mg by mouth at bedtime as needed for sleep.     carvedilol (COREG) 6.25 MG tablet Take 1 tablet (6.25 mg total) by mouth 2 (two) times daily. 180 tablet 0   clopidogrel (PLAVIX) 75 MG tablet Take 1 tablet (75 mg total) by mouth daily. 90 tablet 0   enalapril (VASOTEC) 20 MG tablet Take 1 tablet (20 mg total) by mouth daily. 90 tablet 0   ezetimibe (ZETIA) 10 MG tablet Take 1 tablet (10 mg total) by mouth daily. For cholesterol. 90 tablet 0   furosemide (LASIX) 20 MG tablet Take 1 tablet (20 mg total) by mouth daily as needed for edema. 90 tablet 0   gabapentin (NEURONTIN) 300 MG capsule Take 1 capsule (300 mg total) by mouth 3 (three) times daily. 90 capsule 5   glucose blood (FREESTYLE LITE) test strip Test up to four times a day as  needed. DX 100 each 0   insulin aspart (NOVOLOG FLEXPEN) 100 UNIT/ML FlexPen Inject 22 units with breakfast and lunch, inject 14 units with dinner. 45 mL 3   insulin glargine (LANTUS SOLOSTAR) 100 UNIT/ML Solostar Pen Inject 60 Units into the skin at bedtime. 45 mL 3   Insulin Pen Needle (INSUPEN PEN NEEDLES) 32G X 4 MM MISC Use to inject insulin 4 times daily. 250 each 1   isosorbide mononitrate (IMDUR) 30 MG 24 hr tablet Take 1 tablet (30 mg total) by mouth daily. 90 tablet 0   levothyroxine (SYNTHROID) 100 MCG tablet Take 1 tablet by mouth on an empty stomach with water only on Sunday, Monday, and Tuesday. No food or other medications for 30 minutes. 36 tablet 3   levothyroxine (SYNTHROID) 88 MCG tablet Take 1 tablet by mouth every morning on an empty stomach with water only on Wednesday, Thursday, Friday, Saturday.  No food or other medications for 30 minutes. 48 tablet 3   meloxicam (MOBIC) 15 MG tablet Take 15 mg by mouth daily.     neomycin-polymyxin b-dexamethasone (MAXITROL) 3.5-10000-0.1 SUSP Place 1 drop into the left eye 3 (three) times daily.     potassium chloride (KLOR-CON) 10 MEQ tablet Only take on days you take your lasix. Take 1 tablet (10 mEq total) by mouth for 1 dose on those days you take your lasix '20mg'$  pill. 30 tablet 1   rosuvastatin (CRESTOR) 40 MG tablet Take 1 tablet (40 mg total) by mouth every evening. For cholesterol. 90 tablet 0   spironolactone (ALDACTONE) 25 MG tablet Take 1 tablet (25 mg total) by mouth daily. 90 tablet 3   traZODone (DESYREL) 100 MG tablet TAKE 1 TABLET BY MOUTH AT BEDTIME AS NEEDED FOR SLEEP 90 tablet 0   No current facility-administered medications on file prior to visit.    BP (!) 144/70 (BP Location: Left Arm, Patient Position: Sitting, Cuff Size:  Large)   Pulse 71   Temp 98.2 F (36.8 C) (Temporal)   Ht 5' 5.75" (1.67 m)   Wt 154 lb (69.9 kg)   SpO2 97%   BMI 25.05 kg/m  Objective:   Physical Exam HENT:     Right Ear: Tympanic  membrane and ear canal normal.     Left Ear: Tympanic membrane and ear canal normal.     Nose: Nose normal.  Eyes:     Conjunctiva/sclera: Conjunctivae normal.     Pupils: Pupils are equal, round, and reactive to light.  Neck:     Thyroid: No thyromegaly.  Cardiovascular:     Rate and Rhythm: Normal rate and regular rhythm.     Heart sounds: No murmur heard. Pulmonary:     Effort: Pulmonary effort is normal.     Breath sounds: Normal breath sounds. No rales.  Abdominal:     General: Bowel sounds are normal.     Palpations: Abdomen is soft.     Tenderness: There is no abdominal tenderness.  Musculoskeletal:        General: Normal range of motion.     Cervical back: Neck supple.  Lymphadenopathy:     Cervical: No cervical adenopathy.  Skin:    General: Skin is warm and dry.     Findings: No rash.  Neurological:     Mental Status: She is alert and oriented to person, place, and time.     Cranial Nerves: No cranial nerve deficit.     Deep Tendon Reflexes: Reflexes are normal and symmetric.  Psychiatric:        Mood and Affect: Mood normal.          Assessment & Plan:      This visit occurred during the SARS-CoV-2 public health emergency.  Safety protocols were in place, including screening questions prior to the visit, additional usage of staff PPE, and extensive cleaning of exam room while observing appropriate contact time as indicated for disinfecting solutions.

## 2021-02-15 NOTE — Assessment & Plan Note (Signed)
No concerns today. Not on daily treatment, continue to monitor.

## 2021-02-15 NOTE — Assessment & Plan Note (Signed)
Rx for Shingrix provided. Other vaccines UTD. Mammogram overdue, she declines despite recommendations.  Colonoscopy overdue, referral placed to GI. Bone density scan due, orders placed.  Exam today stable. Labs reviewed.

## 2021-02-15 NOTE — Assessment & Plan Note (Signed)
Follows with cardiology 

## 2021-02-15 NOTE — Assessment & Plan Note (Signed)
Continue rosuvastatin 40 mg and Zetia 10 mg daily. Lipid panel from January 2022 reviewed.

## 2021-02-15 NOTE — Assessment & Plan Note (Signed)
Following with pain management, continue gabapentin 300 mg TID.

## 2021-02-15 NOTE — Assessment & Plan Note (Signed)
Doing well on Trazodone 50 mg HS, continue same.

## 2021-02-15 NOTE — Assessment & Plan Note (Signed)
Offered lung cancer screening for which she declines.

## 2021-02-15 NOTE — Assessment & Plan Note (Signed)
Stable, declines concerns today.

## 2021-02-16 MED ORDER — FREESTYLE LIBRE 2 READER DEVI: 0 refills | Status: DC

## 2021-02-16 MED ORDER — FREESTYLE LIBRE 2 SENSOR MISC: 3 refills | Status: DC

## 2021-02-19 ENCOUNTER — Telehealth: Payer: Self-pay | Admitting: Cardiovascular Disease

## 2021-02-19 NOTE — Telephone Encounter (Signed)
*  STAT* If patient is at the pharmacy, call can be transferred to refill team.   1. Which medications need to be refilled? (please list name of each medication and dose if known) albuterol 108 MCG/ACT inhaler   2. Which pharmacy/location (including street and city if local pharmacy) is medication to be sent to? [place]   3. Do they need a 30 day or 90 day supply? 90 day

## 2021-02-20 NOTE — Telephone Encounter (Signed)
[place], [place] - 02/19/2021  4:17 PM Wynema Birch, RN  Sent: Wed February 20, 2021 10:03 AM  To: Emily Filbert, RN          Message  Needs PCP to continue refills  Dr. Rockey Situ filled for one time, future by PCP

## 2021-02-20 NOTE — Telephone Encounter (Signed)
Pt has been notified.

## 2021-02-20 NOTE — Telephone Encounter (Signed)
Please advise if ok to refill Albuterol inhaler last filled by [facility]. Medication not a cardiac medication.

## 2021-02-27 ENCOUNTER — Other Ambulatory Visit: Payer: Medicare PPO

## 2021-03-05 ENCOUNTER — Ambulatory Visit: Payer: Medicare PPO | Admitting: Physician Assistant

## 2021-03-18 ENCOUNTER — Telehealth: Payer: Self-pay

## 2021-03-18 NOTE — Chronic Care Management (AMB) (Addendum)
Chronic Care Management Pharmacy Assistant   Name: Sarah Payne  MRN: [id number] DOB: [birth year]  Reason for Encounter: Diabetes   Recent office visits:  02/15/21 - PCP - Patient presented for annual physical. She is experiencing episodes of hypoglycemia in between lunch and dinner once weekly so we will have her eat a healthy snack in between. Follow up 3 months.  Recent consult visits:  None since last CCM visit  Hospital visits:  None in previous 6 months  Medications: Outpatient Encounter Medications as of 03/18/2021  Medication Sig   albuterol (VENTOLIN HFA) 108 (90 Base) MCG/ACT inhaler Inhale 2 puffs into the lungs every 6 (six) hours as needed for wheezing or shortness of breath.   amitriptyline (ELAVIL) 50 MG tablet Take 50 mg by mouth at bedtime as needed for sleep.   carvedilol (COREG) 6.25 MG tablet Take 1 tablet (6.25 mg total) by mouth 2 (two) times daily.   clopidogrel (PLAVIX) 75 MG tablet Take 1 tablet (75 mg total) by mouth daily.   Continuous Blood Gluc Receiver (FREESTYLE LIBRE 2 READER) DEVI Use with sensor to check blood sugars 6 times daily.   Continuous Blood Gluc Sensor (FREESTYLE LIBRE 2 SENSOR) MISC Use to check blood sugars 6 times daily.   enalapril (VASOTEC) 20 MG tablet Take 1 tablet (20 mg total) by mouth daily.   ezetimibe (ZETIA) 10 MG tablet Take 1 tablet (10 mg total) by mouth daily. For cholesterol.   furosemide (LASIX) 20 MG tablet Take 1 tablet (20 mg total) by mouth daily as needed for edema.   gabapentin (NEURONTIN) 300 MG capsule Take 1 capsule (300 mg total) by mouth 3 (three) times daily.   glucose blood (FREESTYLE LITE) test strip Test up to four times a day as needed. DX   insulin aspart (NOVOLOG FLEXPEN) 100 UNIT/ML FlexPen Inject 22 units with breakfast and lunch, inject 14 units with dinner.   insulin glargine (LANTUS SOLOSTAR) 100 UNIT/ML Solostar Pen Inject 60 Units into the skin at bedtime.   Insulin Pen Needle (INSUPEN PEN  NEEDLES) 32G X 4 MM MISC Use to inject insulin 4 times daily.   isosorbide mononitrate (IMDUR) 30 MG 24 hr tablet Take 1 tablet (30 mg total) by mouth daily.   levothyroxine (SYNTHROID) 100 MCG tablet Take 1 tablet by mouth on an empty stomach with water only on Sunday, Monday, and Tuesday. No food or other medications for 30 minutes.   levothyroxine (SYNTHROID) 88 MCG tablet Take 1 tablet by mouth every morning on an empty stomach with water only on Wednesday, Thursday, Friday, Saturday.  No food or other medications for 30 minutes.   meloxicam (MOBIC) 15 MG tablet Take 15 mg by mouth daily.   neomycin-polymyxin b-dexamethasone (MAXITROL) 3.5-10000-0.1 SUSP Place 1 drop into the left eye 3 (three) times daily.   potassium chloride (KLOR-CON) 10 MEQ tablet Only take on days you take your lasix. Take 1 tablet (10 mEq total) by mouth for 1 dose on those days you take your lasix '20mg'$  pill.   rosuvastatin (CRESTOR) 40 MG tablet Take 1 tablet (40 mg total) by mouth every evening. For cholesterol.   spironolactone (ALDACTONE) 25 MG tablet Take 1 tablet (25 mg total) by mouth daily.   traZODone (DESYREL) 100 MG tablet TAKE 1 TABLET BY MOUTH AT BEDTIME AS NEEDED FOR SLEEP   No facility-administered encounter medications on file as of 03/18/2021.     Recent Relevant Labs: Lab Results  Component Value Date/Time  HGBA1C 8.4 (A) 02/15/2021 09:17 AM   HGBA1C 8.5 (A) 11/14/2020 11:14 AM   HGBA1C 10.3 (H) 12/24/2018 12:27 PM   HGBA1C 10.4 (H) 09/16/2016 11:53 AM   HGBA1C 10.9 (H) 08/02/2014 04:08 AM   HGBA1C 7.3 (H) 08/19/2013 12:19 PM   MICROALBUR 1.2 05/30/2013 04:48 PM    Kidney Function Lab Results  Component Value Date/Time   CREATININE 1.31 (H) 01/08/2021 11:55 AM   CREATININE 1.31 (H) 01/04/2021 12:11 PM   CREATININE 1.06 (H) 04/04/2016 05:21 PM   CREATININE 1.24 08/02/2014 04:08 AM   CREATININE 1.33 (H) 08/01/2014 12:53 PM   CREATININE 1.07 11/24/2011 03:41 PM   GFR 44.80 (L) 08/03/2020  10:05 AM   GFRNONAA 43 (L) 01/08/2021 11:55 AM   GFRNONAA 46 (L) 08/02/2014 04:08 AM   GFRNONAA 47 (L) 08/20/2013 05:07 AM   GFRNONAA 55 (L) 11/24/2011 03:41 PM   GFRAA 55 (L) 08/30/2020 09:41 AM   GFRAA 56 (L) 08/02/2014 04:08 AM   GFRAA 55 (L) 08/20/2013 05:07 AM   GFRAA 64 11/24/2011 03:41 PM   Contacted patient on 03/19/21 to discuss diabetes disease state.   Current antihyperglycemic regimen:  Novolog 22 units with breakfast and lunch and 14 units at dinner Lantus 60 units at bedtime    Patient verbally confirms she is taking the above medications as directed. Yes  - The patient states she has skipped morning dose of Novolog some due to mid-day sugars being very low  - 40s  What diet changes have been made to improve diabetes control? She uses  zero sugar sodas, trying to add more water.  What recent interventions/DTPs have been made to improve glycemic control:  PCP recommended snack between meals  Have there been any recent hospitalizations or ED visits since last visit with CPP? No  Patient denies hypoglycemic symptoms, including Pale, Sweaty, Shaky, Hungry, Nervous/irritable, and Vision changes  Patient denies hyperglycemic symptoms, including blurry vision, excessive thirst, fatigue, polyuria, and weakness  How often are you checking your blood sugar? 3-4 times daily [facility])  What are your blood sugars ranging?  Fasting: 03/19/21   175  (usually 130-140s) After meals: mid-day running 40's after lunch (2-3 PM)  During the week, how often does your blood glucose drop below 70? The Patient reported that Saturday 03/16/21 she had episode of low BG and ended up in the floor in her bathroom, no injury reported.  Overall BG < 70 about Once a week   Reviewed the following: The 15-15 rule for low sugars: If sugar reading below 70, have 15 grams of carbohydrate to raise your blood sugar and check it after 15 minutes. If it's still below 70 mg/dL, have another serving. 15  grams of carbs may be: -Glucose tablets (see instructions) -Gel tube (see instructions) -4 ounces (1/2 cup) of juice or regular soda (not diet) -1 tablespoon of sugar, honey, or corn syrup -Hard candies, jellybeans or gumdrops--see food label for how many to consume  Repeat these steps until your blood sugar is at least 70 mg/dL. Once your blood sugar is back to normal, eat a meal or snack to make sure it doesn't lower again.    Are you checking your feet daily/regularly? Yes  Adherence Review: Is the patient currently on a STATIN medication? Yes - rosuvastatin Is the patient currently on ACE/ARB medication? Yes - enalapril Does the patient have >5 day gap between last estimated fill dates? No gap identified  Counseled patient on importance of annual eye and  foot exam.   Star Rating Drugs:  Medication:  Last Fill: Day Supply Rosuvastatin '40mg'$      01/04/21            90 Lantus                         08/08/20            25 Novolog                       11/27/20            90  Patient did inquire about re-ordering FreeStyle Libre. Sharyn Lull will call patient later today to discuss.  Upcoming appts: PCP appointment on 05/21/21 and Cardiology appointment on 04/12/21  [name], CPP notified  Avel Sensor, [place] Assistant [phone]  I have reviewed the care management and care coordination activities outlined in this encounter and I am certifying that I agree with the content of this note. See addendum.  [name], PharmD Clinical Pharmacist [facility] at [facility] [phone]

## 2021-03-19 NOTE — Telephone Encounter (Addendum)
Contacted patient to discuss reported hypoglycemia 03/19/21 at 4:30 PM.  She reports BG 120-130 fasting most days. She drinks coffee when she wakes up and her sugar goes up to 240-245 (this is around 11 AM). Then she takes 20 units of Novolog and eats lunch. Her sugar drops to 40-45 around 2 PM daily. She is alert and oriented at that time but feels bad. She eats and sugar comes back up to normal range. She also takes 22 units Novolog with supper and 60 units of Lantus daily at bedtime.  Recommend - reduce Novolog before lunch to 10 units. Follow up call in 2 days to review food diary, glucose log, and insulin admin.  [name], PharmD Clinical Pharmacist [facility] at [facility] [phone]

## 2021-03-22 ENCOUNTER — Telehealth: Payer: Self-pay | Admitting: Primary Care

## 2021-03-22 DIAGNOSIS — E1151 Type 2 diabetes mellitus with diabetic peripheral angiopathy without gangrene: Secondary | ICD-10-CM

## 2021-03-22 NOTE — Telephone Encounter (Signed)
  Encourage patient to contact the pharmacy for refills or they can request refills through [place]:  Please schedule appointment if longer than 1 year  NEXT APPOINTMENT DATE:  MEDICATION: Continuous Blood Gluc Sensor (FREESTYLE LIBRE 2 SENSOR) MISC  Is the patient out of medication? yes  PHARMACY: [facility] [address]   Let patient know to contact pharmacy at the end of the day to make sure medication is ready.  Please notify patient to allow 48-72 hours to process  CLINICAL FILLS OUT ALL BELOW:   LAST REFILL:  QTY:  REFILL DATE:    OTHER COMMENTS:    Okay for refill?  Please advise

## 2021-03-26 NOTE — Telephone Encounter (Signed)
Called no answer no vm. 

## 2021-03-26 NOTE — Telephone Encounter (Signed)
  Encourage patient to contact the pharmacy for refills or they can request refills through [place]:  Please schedule appointment if longer than 1 year  NEXT APPOINTMENT DATE:  MEDICATION:Continuous Blood Gluc Receiver (FREESTYLE LIBRE 2 READER) DEVI  Is the patient out of medication?   [place], [place] [address]   Let patient know to contact pharmacy at the end of the day to make sure medication is ready.  Please notify patient to allow 48-72 hours to process  CLINICAL FILLS OUT ALL BELOW:   LAST REFILL:  QTY:  REFILL DATE:    OTHER COMMENTS:    Okay for refill?  Please advise

## 2021-03-26 NOTE — Telephone Encounter (Signed)
Clarify, does she need the device or sensors?  Okay to fill for sensors.

## 2021-03-28 MED ORDER — FREESTYLE LIBRE 2 SENSOR MISC: 3 refills | Status: DC

## 2021-03-28 NOTE — Telephone Encounter (Signed)
Called patient has device only needs sensors

## 2021-03-28 NOTE — Telephone Encounter (Signed)
Refills sent to pharmacy. 

## 2021-04-03 ENCOUNTER — Telehealth: Payer: Self-pay | Admitting: Physician Assistant

## 2021-04-03 MED ORDER — ISOSORBIDE MONONITRATE ER 30 MG PO TB24: ORAL_TABLET | ORAL | 0 refills | Status: DC

## 2021-04-03 NOTE — Telephone Encounter (Signed)
I reviewed the message below with Dr. Rockey Situ. Dr. Donivan Scull recommendations include: 1) do not take any coreg tonight 2) starting tomorrow, move the isosorbide to the PM  3) take morning medications with food 4) increase fluid intake/ hydrate 5) only take lasix as needed for swelling 6) call back if having recurrent symptoms of syncope/ pre-syncope  I have called and notified of the above MD recommendations. She then advised that she has been taking both doses of her coreg in the AM for a total of 12.5 mg QD.  I have advised the patient she will need to take the coreg 6.25 mg BID as prescribed to give her more even coverage on her BP.  She is aware she may take the imdur at the same time in the evening as she takes the coreg.  The patient voices understanding and is agreeable of the above recommendations.

## 2021-04-03 NOTE — Telephone Encounter (Signed)
Patient states she just passed out and her BP is 83/63.  Pt c/o BP issue: STAT if pt c/o blurred vision, one-sided weakness or slurred speech  1. What are your last 5 BP readings?  Just now 83/632.  2. Are you having any other symptoms (ex. Dizziness, headache, blurred vision, passed out)?  Weak and exhausted, when stands up everything goes black.  Patient states she does not feel any pain.   3. What is your BP issue? Too low

## 2021-04-03 NOTE — Telephone Encounter (Signed)
Call transferred directly to this RN. The patient advised that prior to calling this morning, she had gotten up and walked into the kitchen to fix her husband some coffee. She started to feel "weak in the knees" and proceeded to go to the living room to sit down. Upon walking into the living room, she felt lightheaded and passed out. She advised she "woke up" and realized she had fallen. She advised she did not hit her head.  She took her BP upon her arousal and it as 83/63.   She confirms she has had an "upset stomach" x 2 weeks, but no diarrhea until a small amount this morning. She states she also vomited acid this morning.  She reports now that she just feels weak and tired. She advised she has been drinking fluids, but probably not enough.   She has been monitoring her BP at home since her last office visit in June with [name], [place].  Her highest reading in the last 2 weeks was on 03/19/21- 140/86 in the AM & 103/64 in the PM.   She advised she takes her AM meds when she first wakes up, with no food These include: Coreg 6.25 mg Enalapril 20 mg  Isosorbide 30 mg Spironolactone 25 mg She took a PRN Lasix this morning as well because "I didn't feel well." She took all of her other regular medications this morning as well.   PM meds only include  Coreg 6.25 mg  I have advised the patient that she will: 1) need to hydrate today 2) when sitting or laying, elevated her lower extremities 3) change positions slowly  She is aware I will review with Dr. Leanna Battles, PA to see if they recommend a reduction in the dose of any of her medications, or if they recommend we just split how she is taking her tablets so she is not everything in the AM.  She is aware I will call her back with provider recommendations and is agreeable.

## 2021-04-09 ENCOUNTER — Telehealth: Payer: Self-pay

## 2021-04-09 NOTE — Progress Notes (Addendum)
Chronic Care Management Pharmacy Assistant   Name: Sarah Payne  MRN: [id number] DOB: Mar 08, 1946  Reason for Encounter: Hypertension Disease State   Recent office visits:  None since last CCM contact  Recent consult visits:  04/03/2021 - Sarah Payne, [place] (Cardiology) - Telephone - Patient called in office as she passed out and her BP was 83/63; feeling weak and exhausted, when stands up everything goes black. Patient not in any pain. Diarrhea x2 weeks; vomited acid on this date. Advised to hydrate today, elevate her lower extremities when sitting or laying and change positions slowly.   Hospital visits:  None in previous 6 months  Medications: Outpatient Encounter Medications as of 04/09/2021  Medication Sig   albuterol (VENTOLIN HFA) 108 (90 Base) MCG/ACT inhaler Inhale 2 puffs into the lungs every 6 (six) hours as needed for wheezing or shortness of breath.   amitriptyline (ELAVIL) 50 MG tablet Take 50 mg by mouth at bedtime as needed for sleep.   carvedilol (COREG) 6.25 MG tablet Take 1 tablet (6.25 mg total) by mouth 2 (two) times daily.   clopidogrel (PLAVIX) 75 MG tablet Take 1 tablet (75 mg total) by mouth daily.   Continuous Blood Gluc Receiver (FREESTYLE LIBRE 2 READER) DEVI Use with sensor to check blood sugars 6 times daily.   Continuous Blood Gluc Sensor (FREESTYLE LIBRE 2 SENSOR) MISC Use to check blood sugars 6 times daily.   enalapril (VASOTEC) 20 MG tablet Take 1 tablet (20 mg total) by mouth daily.   ezetimibe (ZETIA) 10 MG tablet Take 1 tablet (10 mg total) by mouth daily. For cholesterol.   furosemide (LASIX) 20 MG tablet Take 1 tablet (20 mg total) by mouth daily as needed for edema.   gabapentin (NEURONTIN) 300 MG capsule Take 1 capsule (300 mg total) by mouth 3 (three) times daily.   glucose blood (FREESTYLE LITE) test strip Test up to four times a day as needed. DX   insulin aspart (NOVOLOG FLEXPEN) 100 UNIT/ML FlexPen Inject 22 units with breakfast and  lunch, inject 14 units with dinner.   insulin glargine (LANTUS SOLOSTAR) 100 UNIT/ML Solostar Pen Inject 60 Units into the skin at bedtime.   Insulin Pen Needle (INSUPEN PEN NEEDLES) 32G X 4 MM MISC Use to inject insulin 4 times daily.   isosorbide mononitrate (IMDUR) 30 MG 24 hr tablet Take 1 tablet (30 mg) by mouth once daily at bedtime   levothyroxine (SYNTHROID) 100 MCG tablet Take 1 tablet by mouth on an empty stomach with water only on Sunday, Monday, and Tuesday. No food or other medications for 30 minutes.   levothyroxine (SYNTHROID) 88 MCG tablet Take 1 tablet by mouth every morning on an empty stomach with water only on Wednesday, Thursday, Friday, Saturday.  No food or other medications for 30 minutes.   meloxicam (MOBIC) 15 MG tablet Take 15 mg by mouth daily.   neomycin-polymyxin b-dexamethasone (MAXITROL) 3.5-10000-0.1 SUSP Place 1 drop into the left eye 3 (three) times daily.   potassium chloride (KLOR-CON) 10 MEQ tablet Only take on days you take your lasix. Take 1 tablet (10 mEq total) by mouth for 1 dose on those days you take your lasix 20mg  pill.   rosuvastatin (CRESTOR) 40 MG tablet Take 1 tablet (40 mg total) by mouth every evening. For cholesterol.   spironolactone (ALDACTONE) 25 MG tablet Take 1 tablet (25 mg total) by mouth daily.   traZODone (DESYREL) 100 MG tablet TAKE 1 TABLET BY MOUTH AT BEDTIME AS  NEEDED FOR SLEEP   No facility-administered encounter medications on file as of 04/09/2021.    Recent Office Vitals: BP Readings from Last 3 Encounters:  02/15/21 (!) 144/70  01/16/21 (!) 154/101  01/04/21 (!) 176/100   Pulse Readings from Last 3 Encounters:  02/15/21 71  01/16/21 70  01/04/21 70    Wt Readings from Last 3 Encounters:  02/15/21 154 lb (69.9 kg)  01/16/21 154 lb (69.9 kg)  01/04/21 154 lb 3.2 oz (69.9 kg)     Kidney Function Lab Results  Component Value Date/Time   CREATININE 1.31 (H) 01/08/2021 11:55 AM   CREATININE 1.31 (H) 01/04/2021 12:11  PM   CREATININE 1.06 (H) 04/04/2016 05:21 PM   CREATININE 1.24 08/02/2014 04:08 AM   CREATININE 1.33 (H) 08/01/2014 12:53 PM   CREATININE 1.07 11/24/2011 03:41 PM   GFR 44.80 (L) 08/03/2020 10:05 AM   GFRNONAA 43 (L) 01/08/2021 11:55 AM   GFRNONAA 46 (L) 08/02/2014 04:08 AM   GFRNONAA 47 (L) 08/20/2013 05:07 AM   GFRNONAA 55 (L) 11/24/2011 03:41 PM   GFRAA 55 (L) 08/30/2020 09:41 AM   GFRAA 56 (L) 08/02/2014 04:08 AM   GFRAA 55 (L) 08/20/2013 05:07 AM   GFRAA 64 11/24/2011 03:41 PM    BMP Latest Ref Rng & Units 01/08/2021 01/04/2021 08/30/2020  Glucose 70 - 99 mg/dL 190(H) 118(H) 215(H)  BUN 8 - 23 mg/dL 28(H) 19 17  Creatinine 0.44 - 1.00 mg/dL 1.31(H) 1.31(H) 1.15(H)  BUN/Creat Ratio 12 - 28 - 15 15  Sodium 135 - 145 mmol/L 137 139 138  Potassium 3.5 - 5.1 mmol/L 3.6 4.4 4.2  Chloride 98 - 111 mmol/L 102 100 102  CO2 22 - 32 mmol/L 28 25 23   Calcium 8.9 - 10.3 mg/dL 9.2 9.2 9.0   Contacted patient on 04/12/2021 to discuss hypertension disease state  Current antihypertensive regimen:  Enalapril 20 mg  - take one tablet daily Carvedilol 6.25 mg - take one tablet   Patient verbally confirms she is taking the above medications as directed. Yes  How often are you checking your Blood Pressure? Patient takes her blood pressure at home. She checks it one to two times a day. The first reading is when she gets up; before she eats.   Patient states she has been having dizzy spells and everything goes black. This has been going on for a month. When things start going dark the patient will go to the living and fall on the couch so she doesn't pass out. Patient has passed out two times (April 14, 2021 and 04/03/2021). Patient has been trying to get in to PCP, but can not drive due to dizzy spells. Patient would like to talk to someone at [facility], NP office. Patient has appointment with her heart doctor today at 11:00am for follow-up when they checked the arteries in her neck .   Current  home BP readings:  04/11/2021 - 192/77 04/10/2021 - did not take 04/09/2021 - 129/88 04/08/2021 - 104/77 04/07/2021 - 132/81 04/06/2021 - 112/70 04/05/2021 - 119/79 morning - evening 122/76 04/04/2021 - 122/77 morning - evening  98/67  04/02/2021 - 83/63 morning  - Patient passed out right before reading - evening 108/65 04/01/2021 - 128/91 morning - evening 96/56 03/31/2021 - 83/53 morning  - evening 85/53  Wrist or arm cuff: Arm Caffeine intake: 2 cups of coffee every day. Salt intake: Does salt food, but watches it.  OTC medications including pseudoephedrine or NSAIDs? Advil 3x day  Any  readings above 180/120? No  What recent interventions/DTPs have been made by any provider to improve Blood Pressure control since last CPP Visit: None, patient has been taking her medication as she should and checking her BP 1-2 times a day.   Any recent hospitalizations or ED visits since last visit with CPP? No  What diet changes have been made to improve Blood Pressure Control?  Patient does salt food, but pays attention to how much she adds.   What exercise is being done to improve your Blood Pressure Control?  Patient states she does not exercise. She is too tired all the time to do anything.   Adherence Review: Is the patient currently on ACE/ARB medication? Yes Does the patient have >5 day gap between last estimated fill dates? No  Star Rating Drugs:  Medication:  Last Fill: Day Supply Rosuvastatin 40mg     01/17/2021 90 Enalapril  01/17/2021 90    Lantus                         02/05/2021 75 Novolog                      02/05/2021 77 Verified fill dates with [facility]   Care Gaps: Annual wellness visit in last year? No Most Recent BP reading: 140/70 on 02/15/2021  If Diabetic: Most recent A1C reading: 8.4 on 02/15/2021 Last eye exam / retinopathy screening: 10/10/2020 Last diabetic foot exam: 02/01/2020  Cardiology appointment on 04/11/2021 for   Echocardiogram Cardiology appointment on 04/12/2021 for results/follow-up to Echo.  [name], CPP notified  [name], [place] Clinical Pharmacy Assistant [phone]   I have reviewed the care management and care coordination activities outlined in this encounter and I am certifying that I agree with the content of this note. Scheduled CCM follow up 04/22/21.   [name], PharmD Clinical Pharmacist [facility] [address] [phone]

## 2021-04-10 ENCOUNTER — Other Ambulatory Visit: Payer: Self-pay | Admitting: Physician Assistant

## 2021-04-10 ENCOUNTER — Other Ambulatory Visit: Payer: Self-pay | Admitting: Primary Care

## 2021-04-10 DIAGNOSIS — G4700 Insomnia, unspecified: Secondary | ICD-10-CM

## 2021-04-11 ENCOUNTER — Ambulatory Visit (INDEPENDENT_AMBULATORY_CARE_PROVIDER_SITE_OTHER): Payer: Medicare PPO

## 2021-04-11 ENCOUNTER — Other Ambulatory Visit: Payer: Self-pay

## 2021-04-11 DIAGNOSIS — R55 Syncope and collapse: Secondary | ICD-10-CM | POA: Diagnosis not present

## 2021-04-11 DIAGNOSIS — R42 Dizziness and giddiness: Secondary | ICD-10-CM

## 2021-04-11 DIAGNOSIS — E1169 Type 2 diabetes mellitus with other specified complication: Secondary | ICD-10-CM

## 2021-04-11 LAB — ECHOCARDIOGRAM COMPLETE
AR max vel: 2.24 cm2
AV Area VTI: 2.34 cm2
AV Area mean vel: 2.22 cm2
AV Mean grad: 3 mmHg
AV Peak grad: 6.7 mmHg
Ao pk vel: 1.29 m/s
Area-P 1/2: 5.88 cm2
Calc EF: 54.8 %
Single Plane A2C EF: 51.3 %
Single Plane A4C EF: 54.1 %

## 2021-04-12 ENCOUNTER — Ambulatory Visit (INDEPENDENT_AMBULATORY_CARE_PROVIDER_SITE_OTHER): Payer: Medicare PPO

## 2021-04-12 ENCOUNTER — Ambulatory Visit (INDEPENDENT_AMBULATORY_CARE_PROVIDER_SITE_OTHER): Payer: Medicare PPO | Admitting: Physician Assistant

## 2021-04-12 ENCOUNTER — Encounter: Payer: Self-pay | Admitting: Physician Assistant

## 2021-04-12 VITALS — BP 141/82 | HR 69 | Ht 67.0 in | Wt 153.4 lb

## 2021-04-12 DIAGNOSIS — I447 Left bundle-branch block, unspecified: Secondary | ICD-10-CM

## 2021-04-12 DIAGNOSIS — R7989 Other specified abnormal findings of blood chemistry: Secondary | ICD-10-CM

## 2021-04-12 DIAGNOSIS — I10 Essential (primary) hypertension: Secondary | ICD-10-CM | POA: Diagnosis not present

## 2021-04-12 DIAGNOSIS — R55 Syncope and collapse: Secondary | ICD-10-CM | POA: Diagnosis not present

## 2021-04-12 DIAGNOSIS — I739 Peripheral vascular disease, unspecified: Secondary | ICD-10-CM

## 2021-04-12 DIAGNOSIS — I5032 Chronic diastolic (congestive) heart failure: Secondary | ICD-10-CM | POA: Diagnosis not present

## 2021-04-12 DIAGNOSIS — I779 Disorder of arteries and arterioles, unspecified: Secondary | ICD-10-CM

## 2021-04-12 DIAGNOSIS — Z8639 Personal history of other endocrine, nutritional and metabolic disease: Secondary | ICD-10-CM | POA: Diagnosis not present

## 2021-04-12 DIAGNOSIS — E1169 Type 2 diabetes mellitus with other specified complication: Secondary | ICD-10-CM

## 2021-04-12 DIAGNOSIS — E1151 Type 2 diabetes mellitus with diabetic peripheral angiopathy without gangrene: Secondary | ICD-10-CM

## 2021-04-12 DIAGNOSIS — I2510 Atherosclerotic heart disease of native coronary artery without angina pectoris: Secondary | ICD-10-CM

## 2021-04-12 DIAGNOSIS — E785 Hyperlipidemia, unspecified: Secondary | ICD-10-CM

## 2021-04-12 DIAGNOSIS — R42 Dizziness and giddiness: Secondary | ICD-10-CM | POA: Diagnosis not present

## 2021-04-12 DIAGNOSIS — Z122 Encounter for screening for malignant neoplasm of respiratory organs: Secondary | ICD-10-CM

## 2021-04-12 DIAGNOSIS — E039 Hypothyroidism, unspecified: Secondary | ICD-10-CM | POA: Diagnosis not present

## 2021-04-12 DIAGNOSIS — R9431 Abnormal electrocardiogram [ECG] [EKG]: Secondary | ICD-10-CM

## 2021-04-12 DIAGNOSIS — I5033 Acute on chronic diastolic (congestive) heart failure: Secondary | ICD-10-CM

## 2021-04-12 DIAGNOSIS — E876 Hypokalemia: Secondary | ICD-10-CM

## 2021-04-12 DIAGNOSIS — J449 Chronic obstructive pulmonary disease, unspecified: Secondary | ICD-10-CM

## 2021-04-12 MED ORDER — CLOPIDOGREL BISULFATE 75 MG PO TABS: 75.0000 mg | ORAL_TABLET | Freq: Every day | ORAL | 3 refills | Status: DC

## 2021-04-12 MED ORDER — POTASSIUM CHLORIDE CRYS ER 10 MEQ PO TBCR: EXTENDED_RELEASE_TABLET | ORAL | 3 refills | Status: DC

## 2021-04-12 MED ORDER — EZETIMIBE 10 MG PO TABS: 10.0000 mg | ORAL_TABLET | Freq: Every day | ORAL | 3 refills | Status: DC

## 2021-04-12 MED ORDER — ROSUVASTATIN CALCIUM 40 MG PO TABS: 40.0000 mg | ORAL_TABLET | Freq: Every evening | ORAL | 3 refills | Status: DC

## 2021-04-12 MED ORDER — CARVEDILOL 6.25 MG PO TABS
6.2500 mg | ORAL_TABLET | Freq: Two times a day (BID) | ORAL | 3 refills | Status: DC
Start: 2021-04-12 — End: 2021-06-12

## 2021-04-12 MED ORDER — ENALAPRIL MALEATE 20 MG PO TABS: 20.0000 mg | ORAL_TABLET | Freq: Every day | ORAL | 3 refills | Status: DC

## 2021-04-12 MED ORDER — FUROSEMIDE 20 MG PO TABS: 20.0000 mg | ORAL_TABLET | Freq: Every day | ORAL | 3 refills | Status: DC | PRN

## 2021-04-12 MED ORDER — SPIRONOLACTONE 25 MG PO TABS: 25.0000 mg | ORAL_TABLET | Freq: Every day | ORAL | 3 refills | Status: DC

## 2021-04-12 NOTE — Progress Notes (Signed)
Years   Office Visit    Patient Name: Sarah Payne Date of Encounter: 04/12/2021  PCP:  [name], NP   [facility]  Cardiologist:  Ida Rogue, MD  Advanced Practice Provider:  No care team member to display Electrophysiologist:  None :[phone]}   Chief Complaint    Chief Complaint  Patient presents with   Other    Post echo/carotid c/o syncope/dizziness. Meds reviewed verbally with pt.    75 y.o. female with history of CAD s/p prior PCI/DES to the distal RCA (08/2011) and mid left circumflex (01/8294), chronic systolic heart failure, PAD, hyperlipidemia, hypertension, poorly controlled diabetes, stress cardiomyopathy, COPD and prior tobacco use, hypothyroidism, sleep apnea, GERD, PVCs, and who presents today for 3 mo follow-up.  Past Medical History    Past Medical History:  Diagnosis Date   Acute systolic (congestive) heart failure ([place]) 01/01/2016   Cataract    BILATERAL REMOVED   Chronic low back pain    COPD (chronic obstructive pulmonary disease) (HCC)    Coronary artery disease    a. multiple PCIs to the mLCx. b. stent to dRCA 08/2011 in setting of NSTEMI. c. DES to prox-mid LCx for ISR 09/2011. d.  DESx2 to prox-mRCA 07/2012. e. Takotsubo event 12/2015 with patent stents.   Depression    Diabetes mellitus    Diverticulosis    Frequent PVCs    a. Noted in hospital 12/2015.   GERD (gastroesophageal reflux disease)    Hyperlipidemia    Hypertension    Hypothyroidism    Impingement syndrome of left shoulder    Marijuana abuse    Myocardial infarction (HCC)    x 5   Sleep apnea    mild-does not use cpap   Takotsubo cardiomyopathy    a. 12/2015 - nephew committed suicide 1 week prior, sister died the morning of presentation - initially called a STEMI; cath with patent stents. LVEF 25-30%.   Tendonitis of left rotator cuff    Tobacco abuse    Vascular dementia    Past Surgical History:  Procedure Laterality Date   ABDOMINAL  AORTOGRAM W/LOWER EXTREMITY N/A 09/12/2020   Procedure: ABDOMINAL AORTOGRAM W/LOWER EXTREMITY;  Surgeon: [place] Hampshire, MD;  Location: [facility];  Service: Cardiovascular;  Laterality: N/A;   ABDOMINAL HYSTERECTOMY     APPENDECTOMY     BACK SURGERY     CARDIAC CATHETERIZATION  2013   CARDIAC CATHETERIZATION  2016   CARDIAC CATHETERIZATION N/A 01/01/2016   Procedure: Left Heart Cath and Coronary Angiography;  Surgeon: Jettie Booze, MD;  Location: [facility];  Service: Cardiovascular;  Laterality: N/A;   CATARACT EXTRACTION W/PHACO Left 01/30/2015   Procedure: CATARACT EXTRACTION PHACO AND INTRAOCULAR LENS PLACEMENT (IOC);  Surgeon: Birder Robson, MD;  Location: [facility];  Service: Ophthalmology;  Laterality: Left;  [place] 00:47    CATARACT EXTRACTION W/PHACO Right 02/13/2015   Procedure: CATARACT EXTRACTION PHACO AND INTRAOCULAR LENS PLACEMENT (IOC);  Surgeon: Birder Robson, MD;  Location: [facility];  Service: Ophthalmology;  Laterality: Right;  cassette lot # [id number] H [place]  00:29.9 AP  20.7 CDE  6.20   COLONOSCOPY N/A 11/02/2014   Procedure: COLONOSCOPY;  Surgeon: Inda Castle, MD;  Location: [facility];  Service: Endoscopy;  Laterality: N/A;   CORONARY ANGIOPLASTY  2012   stent x 3    CORONARY ANGIOPLASTY WITH STENT PLACEMENT  2013   ESOPHAGOGASTRODUODENOSCOPY (EGD) WITH PROPOFOL N/A 09/21/2018   Procedure: ESOPHAGOGASTRODUODENOSCOPY (EGD)  WITH PROPOFOL;  Surgeon: [name], MD;  Location: [facility];  Service: Gastroenterology;  Laterality: N/A;   PERIPHERAL VASCULAR ATHERECTOMY Left 09/12/2020   Procedure: PERIPHERAL VASCULAR ATHERECTOMY;  Surgeon: [place] Hampshire, MD;  Location: [facility];  Service: Cardiovascular;  Laterality: Left;   PERIPHERAL VASCULAR BALLOON ANGIOPLASTY  09/12/2020   Procedure: PERIPHERAL VASCULAR BALLOON ANGIOPLASTY;  Surgeon: Wellington Hampshire, MD;  Location: [facility];  Service: Cardiovascular;;   SHOULDER SURGERY  Left 2017    Allergies  Allergies  Allergen Reactions   No Known Allergies     History of Present Illness    Sarah Payne is a 75 y.o. female with PMH as above.  She has history of CAD s/p prior PCI/DES to the distal RCA (08/2011) and mid left circumflex (01/5169), chronic systolic heart failure, PAD, hyperlipidemia, hypertension, poorly controlled diabetes, stress cardiomyopathy, COPD and prior tobacco use, hypothyroidism, and sleep apnea.  Seen 01/04/2021 with increasing frequency and severity of episodes of presyncope.  She noted "black outs."  Episodes occurred when she tood up and walked the length of a room from the couch to the door.  She reported her vision would start to darken. She was standing too quickly.  She felt unsteady on her feet.  She often ran into door frames.  Claudication symptoms remained improved s/p her intervention.  She reported poor hydration. She did not like water.  She often felt her heart racing and pounding in her chest.  She felt exhausted all of the time and would nap almost every day.  A1c discussed with patient report it improved from 8.5.  Recent thyroid labs discussed.  She noted ongoing tobacco use at 1 pack/day.  She reported she did not tolerate a CPAP.  BP 176/100.  Reds vest 37%.  Diuresis was increased for 3 days.  She was instructed to call the office if ongoing elevated SBP over 160 or DBP over 90, at which time additional antihypertensive changes could be made.  Labs were obtained, including BMET, TSH, and free T4.  Echo and carotids ordered and as below with findings reassuring.  Today, 04/12/2021, she returns to clinic and notes ongoing episodes of syncope.  She continues to note that syncope is preceded by blackening of her vision.  She is still getting up too quickly.  She is unable to go to the kitchen and sit back down, estimated 5 to 10 feet, and without symptoms.  She is unable to handle the stairs very well.  She reports bloating all of the time  and constipation/bowel issues.  She reports bendopnea.  She denies any chest pain or shortness of breath.  She does feel her heart race if she has been and moving for some time.  She reports ongoing fatigue.  She notes stressors, being the only caretaker to her spouse.  Today she denies previous reported intolerance to CPAP and reports she is unaware of any history of sleep apnea. BP log brought in today with some entries indicating EtOH use as part of the influencing factors. Orthostatics positive. She continues to note she does not like H2O.   Home Medications   Current Outpatient Medications  Medication Instructions   albuterol (VENTOLIN HFA) 108 (90 Base) MCG/ACT inhaler 2 puffs, Inhalation, Every 6 hours PRN   amitriptyline (ELAVIL) 50 mg, Oral, At bedtime PRN   carvedilol (COREG) 6.25 mg, Oral, 2 times daily   clopidogrel (PLAVIX) 75 mg, Oral, Daily   Continuous Blood Gluc  Receiver (FREESTYLE LIBRE 2 READER) DEVI Use with sensor to check blood sugars 6 times daily.   Continuous Blood Gluc Sensor (FREESTYLE LIBRE 2 SENSOR) MISC Use to check blood sugars 6 times daily.   enalapril (VASOTEC) 20 mg, Oral, Daily   ezetimibe (ZETIA) 10 mg, Oral, Daily, For cholesterol.   furosemide (LASIX) 20 mg, Oral, Daily PRN   gabapentin (NEURONTIN) 300 mg, Oral, 3 times daily   glucose blood (FREESTYLE LITE) test strip Test up to four times a day as needed. DX   insulin aspart (NOVOLOG FLEXPEN) 100 UNIT/ML FlexPen Inject 22 units with breakfast and lunch, inject 14 units with dinner.   Insulin Pen Needle (INSUPEN PEN NEEDLES) 32G X 4 MM MISC Use to inject insulin 4 times daily.   isosorbide mononitrate (IMDUR) 30 MG 24 hr tablet Take 1 tablet (30 mg) by mouth once daily at bedtime   Lantus SoloStar 60 Units, Subcutaneous, Daily at bedtime   levothyroxine (SYNTHROID) 100 MCG tablet Take 1 tablet by mouth on an empty stomach with water only on Sunday, Monday, and Tuesday. No food or other medications for 30  minutes.   levothyroxine (SYNTHROID) 88 MCG tablet Take 1 tablet by mouth every morning on an empty stomach with water only on Wednesday, Thursday, Friday, Saturday.  No food or other medications for 30 minutes.   meloxicam (MOBIC) 15 mg, Oral, Daily   neomycin-polymyxin b-dexamethasone (MAXITROL) 3.5-10000-0.1 SUSP 1 drop, Left Eye, 3 times daily   potassium chloride (KLOR-CON) 10 MEQ tablet Only take on days you take your lasix. Take 1 tablet (10 mEq total) by mouth for 1 dose on those days you take your lasix 20mg  pill.   rosuvastatin (CRESTOR) 40 mg, Oral, Every evening, For cholesterol.   spironolactone (ALDACTONE) 25 mg, Oral, Daily   traZODone (DESYREL) 100 MG tablet TAKE 1 TABLET BY MOUTH AT BEDTIME AS NEEDED FOR SLEEP     Review of Systems    She reports syncope when standing and walking, described as a darkening of her vision and feeling of instability.  She reports bendopnea and abdominal distention.  She is constipated.  Orthostatics positive with pt report she does not like water and recent BP logs showing EtOH use often. She notes ongoing fatigue.  She reports stressors. She denies chest pain, dyspnea, pnd, orthopnea, n, v, edema, weight gain, or early satiety.    All other systems reviewed and are otherwise negative except as noted above.  Physical Exam    VS:  BP (!) 141/82 (BP Location: Left Arm, Patient Position: Sitting, Cuff Size: Normal)   Pulse 69   Ht 5\' 7"  (1.702 m)   Wt 153 lb 6 oz (69.6 kg)   SpO2 97%   BMI 24.02 kg/m  , BMI Body mass index is 24.02 kg/m. GEN: Well nourished, well developed, in no acute distress. HEENT: normal. Neck: Supple, no JVD, left carotid bruit, no right carotid bruit, or masses. Cardiac: RRR, no murmurs, rubs, or gallops. No clubbing, cyanosis.mild bilateral nonpitting edema.  Radials/DP/PT 2+ and equal bilaterally.  Respiratory:  Respirations regular and unlabored, coarse breath sounds bilaterally, bilateral expiratory wheezing. GI:  Soft, nontender, nondistended, BS + x 4. MS: no deformity or atrophy. Skin: warm and dry, no rash. Neuro:  Strength and sensation are intact. Psych: Normal affect.  Accessory Clinical Findings    ECG personally reviewed by me today -NSR, 69 bpm, first-degree AV block with PR interval 216 ms, left bundle branch block is seen and previously reviewed  by DOD at prior visit and QRS 148 ms, QTC 477 ms- no acute changes.  VITALS Reviewed today   Temp Readings from Last 3 Encounters:  02/15/21 98.2 F (36.8 C) (Temporal)  01/16/21 (!) 97 F (36.1 C) (Temporal)  11/14/20 98.6 F (37 C) (Temporal)   BP Readings from Last 3 Encounters:  04/12/21 (!) 141/82  02/15/21 (!) 144/70  01/16/21 (!) 154/101   Pulse Readings from Last 3 Encounters:  04/12/21 69  02/15/21 71  01/16/21 70    Wt Readings from Last 3 Encounters:  04/12/21 153 lb 6 oz (69.6 kg)  02/15/21 154 lb (69.9 kg)  01/16/21 154 lb (69.9 kg)     LABS  reviewed today   Lab Results  Component Value Date   WBC 8.4 08/30/2020   HGB 13.8 08/30/2020   HCT 41.5 08/30/2020   MCV 93 08/30/2020   PLT 242 08/30/2020   Lab Results  Component Value Date   CREATININE 1.31 (H) 01/08/2021   BUN 28 (H) 01/08/2021   NA 137 01/08/2021   K 3.6 01/08/2021   CL 102 01/08/2021   CO2 28 01/08/2021   Lab Results  Component Value Date   ALT 22 08/03/2020   AST 23 08/03/2020   ALKPHOS 68 08/03/2020   BILITOT 0.5 08/03/2020   Lab Results  Component Value Date   CHOL 102 08/03/2020   HDL 38.70 (L) 08/03/2020   LDLCALC 29 08/03/2020   LDLDIRECT 143.0 12/24/2018   TRIG 171.0 (H) 08/03/2020   CHOLHDL 3 08/03/2020    Lab Results  Component Value Date   HGBA1C 8.4 (A) 02/15/2021   Lab Results  Component Value Date   TSH 1.140 01/04/2021     STUDIES/PROCEDURES reviewed today   Carotids 04/11/21 Summary:  Right Carotid: Velocities in the right ICA are consistent with a 1-39%  stenosis.                  Non-hemodynamically significant plaque <50% noted in the  CCA. The                 ECA appears <50% stenosed.   Left Carotid: Velocities in the left ICA are consistent with a 1-39%  stenosis.                Non-hemodynamically significant plaque <50% noted in the  CCA. The                ECA appears <50% stenosed.   Vertebrals:  Bilateral vertebral arteries demonstrate antegrade flow.  Subclavians: Normal flow hemodynamics were seen in bilateral subclavian               arteries.   Echo 04/11/21  1. Left ventricular ejection fraction, by estimation, is 55%. Left  ventricular ejection fraction by 2D MOD biplane is 54.8 %. The left  ventricle has normal function. The left ventricle has no regional wall  motion abnormalities. There is mild left  ventricular hypertrophy. Left ventricular diastolic parameters are  consistent with Grade I diastolic dysfunction (impaired relaxation).   2. Right ventricular systolic function is normal. The right ventricular  size is normal.   3. The mitral valve was not well visualized. No evidence of mitral valve  regurgitation.   4. The aortic valve is tricuspid. Aortic valve regurgitation is not  visualized. Mild aortic valve sclerosis is present, with no evidence of  aortic valve stenosis.   5. The inferior vena cava is normal in size with greater  than 50%  respiratory variability, suggesting right atrial pressure of 3 mmHg.   Echo 05/02/2020  1. Left ventricular ejection fraction, by estimation, is 50 to 55%. The  left ventricle has low normal function. The left ventricle has no regional  wall motion abnormalities. There is mild left ventricular hypertrophy.  Left ventricular diastolic  parameters are consistent with Grade II diastolic dysfunction  (pseudonormalization).   2. Right ventricular systolic function is normal. The right ventricular  size is normal. Mildly increased right ventricular wall thickness.   3. The mitral valve is normal in  structure. No evidence of mitral valve  regurgitation. No evidence of mitral stenosis.   4. The aortic valve is grossly normal. Aortic valve regurgitation is not  visualized. Mild aortic valve sclerosis is present, with no evidence of  aortic valve stenosis.   5. The inferior vena cava is normal in size with greater than 50%  respiratory variability, suggesting right atrial pressure of 3 mmHg.   Carotid study 05/02/2020 Summary:  Right Carotid: Velocities in the right ICA are consistent with a 1-39%  stenosis.                 Non-hemodynamically significant plaque <50% noted in the  CCA.                 The ECA appears <50% stenosed.   Left Carotid: Velocities in the left ICA are consistent with a 1-39%  stenosis.                Non-hemodynamically significant plaque <50% noted in the  CCA.                The ECA appears <50% stenosed.   Vertebrals:  Bilateral vertebral arteries demonstrate antegrade flow.  Subclavians: Normal flow hemodynamics were seen in bilateral subclavian               arteries.   01/01/2016 Left heart catheterization and coronary angiography Patent stents in the circumflex and RCA. There is severe left ventricular systolic dysfunction in a pattern of Takotsubo cardiomyopathy. Medical therapy.  Assessment & Plan    Syncope, Orthostatic hypotension --Reports episodes of syncope. Positive orthostatics today. EtOH use noted on review of logs in detail. She avoids water and does quick position changes.  Echo and carotids reviewed and reassuring. Discussed today that elevated BP, EtOH, DM2, COPD, and dehydration / orthostatic hypotension could all be possible contributing factors.  Also considered is possible ischemia with recommendation for further ischemic workup with cath if ongoing sx and Zio unrevealing and s/p hydration and reduction of EtOH and treatment of OSA. Will order Zio-AT today to exclude arrhythmia. CT for lung CA screening ordered per pt  preference. Reassess at RTC and consider cath at that time if Zio and CT unrevealing.  She does have RF for CAD including smoking, known history of CAD, PAD, and uncontrolled LDL and BP. Recommended slow position changes and that she avoid driving until further work-up of presyncope.  Smoking cessation recommended. Check BMET, CBC.  Chronic combined systolic and diastolic heart failure History of Takotsubo cardiomyopathy --Denies SOB with orthostatics positive with low H2O and EtOH use noted. Will order BMET. Previous history of Takotsubo cardiomyopathy. Cath 2017 with patent stents in the circumflex and RCA, as well as severe LV dysfunction consistent with Takotsubo. Echo as above and reassuring. If ongoing sx at RTC, and if Zio-AT and CT unrevealing, schedule for cath at RTC. Will order lung CA screening /  CT today. Recommend hydration though with total daily fluid under 2L and salt under 2g. Continue Coreg and enalapril. Discussed compression stockings, abd binding, and leg elevation.  EtOH cessation recommended.   Coronary artery disease without angina --No anginal symptoms at this time, though syncope concerning.  Live Zip ordered. Continue current medications including DAPT, Imdur, Coreg, Crestor, Zetia.  Aggressive risk factor modification recommended.  BP control recommended with consideration of positive orthostatics and recent syncope - avoid salt.  Glycemic and LDL /Tg control recommended as below. Complete smoking cessation recommended, as well as avoid EtOH.  CT ordered for lung CA screening. Lifestyle changes with diet and increased activity discussed. If ongoing sx and Zio AT/ CT unrevealing, recommend consider cath at RTC.  Prolonged Qtc, EKG with LBBB --Qtc improved from previous prolonged at Qtc 501 ms and as reviewed by DOD in detail at that time with recommendation to avoid QT prolonging medications. Echo reassuring. Will check electrolytes with BMET given Qtc. Ensure K stable. Cath if  ongoing sx as above and following Zio AT and CT.   Essential hypertension, goal BP less than 130/80 --Suboptimal control. Consider as contributing to sx. Discussed recommended fluid and salt restrictions. Avoid EtOH and Na. Increase activity as tolerated.  Hyperlipidemia, goal LDL below 70 Hypertriglyceridemia --Continue current Crestor 40 mg daily and Zetia 10 mg daily.  Consider PCSK9i at RTC.  Peripheral arterial disease s/p recent intervention to left SFA --No recurrent claudication symptoms.  S/p recent orbital atherectomy and drug-coated balloon angioplasty to the left SFA with resolution of left calf claudication.  Repeat studies as recommended. Continue Crestor, Zetia, and DAPT with ASA and clopidogrel. Consider PCSK9i.  Mild bilateral carotid artery disease  --Most recent studies as above. Recommendations for RF prevention as under PAD.  Hypothyroidism --Per PCP.  Tobacco and EtOH use, current / COPD -- Smoking 1 pack/day.  Smoking and EtOH cessation recommended.  Ordered lung CA / CT.  DM2 -- Most recent 10/2020 A1c 8.5.  Strict glycemic control recommended for cardiovascular/PAD RF modification.    Disposition: RTC after Zio AT and reassess sx. If ongoing and CT/Zio unrevealing, catheterization should be considered at that time.   *Please be aware that the above documentation was completed voice recognition software and may contain dictation errors.    Arvil Chaco, PA-C 04/12/2021

## 2021-04-12 NOTE — Patient Instructions (Addendum)
Medication Instructions:  Your physician recommends that you continue on your current medications as directed. Please refer to the Current Medication list given to you today.  *If you need a refill on your cardiac medications before your next appointment, please call your pharmacy*   Lab Work: Bmp, Cbc- Today   If you have labs (blood work) drawn today and your tests are completely normal, you will receive your results only by: [place] (if you have MyChart) OR A paper copy in the mail If you have any lab test that is abnormal or we need to change your treatment, we will call you to review the results.   Testing/Procedures: Your physician has ordered for you to wear a zio monitor for 14 days *instructions below*  Your physician has ordered a CT of your lungs   Follow-Up: Follow up in 2 months with Dr. Rockey Situ  Other Instructions ZIO AT Long term monitor-Live Telemetry  Your physician has requested you wear a ZIO patch monitor for 14 days.  This is a single patch monitor. [employer] supplies one patch monitor per enrollment. Additional  stickers are not available.  Please do not apply patch if you will be having a Nuclear Stress Test, Echocardiogram, Cardiac CT, MRI,  or Chest Xray during the period you would be wearing the monitor. The patch cannot be worn during  these tests. You cannot remove and re-apply the ZIO AT patch monitor.  Your ZIO patch monitor will be mailed 3 day USPS to your address on file. It may take 3-5 days to  receive your monitor after you have been enrolled.  Once you have received your monitor, please review the enclosed instructions. Your monitor has  already been registered assigning a specific monitor serial # to you.   Billing and Patient Assistance Program information  Sarah Payne has been supplied with any insurance information on record for billing. [employer] offers a sliding scale Patient Assistance Program for patients without insurance, or whose   insurance does not completely cover the cost of the ZIO patch monitor. You must apply for the  Patient Assistance Program to qualify for the discounted rate. To apply, call [employer] at [phone],  select option 4, select option 2 , ask to apply for the Patient Assistance Program, (you can request an  interpreter if needed). [employer] will ask your household income and how many people are in your  household. [employer] will quote your out-of-pocket cost based on this information. They will also be able  to set up a 12 month interest free payment plan if needed.  Applying the monitor   Shave hair from upper left chest.  Hold the abrader disc by orange tab. Rub the abrader in 40 strokes over left upper chest as indicated in  your monitor instructions.  Clean area with 4 enclosed alcohol pads. Use all pads to ensure the area is cleaned thoroughly. Let  dry.  Apply patch as indicated in monitor instructions. Patch will be placed under collarbone on left side of  chest with arrow pointing upward.  Rub patch adhesive wings for 2 minutes. Remove the white label marked "1". Remove the white label  marked "2". Rub patch adhesive wings for 2 additional minutes.  While looking in a mirror, press and release button in center of patch. A small green light will flash 3-4  times. This will be your only indicator that the monitor has been turned on.  Do not shower for the first 24 hours. You may shower after  the first 24 hours.  Press the button if you feel a symptom. You will hear a small click. Record Date, Time and Symptom in  the Patient Log.   Starting the Gateway  In your kit there is a Hydrographic surveyor box the size of a cellphone. This is Airline pilot. It transmits all your  recorded data to [facility]. This box must always stay within 10 feet of you. Open the box and push the *  button. There will be a light that blinks orange and then green a few times. When the light stops  blinking, the Gateway is  connected to the ZIO patch. Call [employer] at [phone] to confirm your monitor is transmitting.  Returning your monitor  Remove your patch and place it inside the [place]. In the lower half of the Gateway there is a white  bag with prepaid postage on it. Place Gateway in bag and seal. Mail package back to [place] as soon as  possible. Your physician should have your final report approximately 7 days after you have mailed back  your monitor. Call [place] at [phone] if you have questions regarding your ZIO AT  patch monitor. Call them immediately if you see an orange light blinking on your monitor.  If your monitor falls off in less than 4 days, contact our Monitor department at [phone]. If your  monitor becomes loose or falls off after 4 days call [employer] at [phone] for suggestions on  securing your monitor

## 2021-04-13 LAB — BASIC METABOLIC PANEL
BUN/Creatinine Ratio: 19 (ref 12–28)
BUN: 42 mg/dL — ABNORMAL HIGH (ref 8–27)
CO2: 20 mmol/L (ref 20–29)
Calcium: 10 mg/dL (ref 8.7–10.3)
Chloride: 97 mmol/L (ref 96–106)
Creatinine, Ser: 2.24 mg/dL — ABNORMAL HIGH (ref 0.57–1.00)
Glucose: 189 mg/dL — ABNORMAL HIGH (ref 65–99)
Potassium: 5.6 mmol/L — ABNORMAL HIGH (ref 3.5–5.2)
Sodium: 136 mmol/L (ref 134–144)
eGFR: 23 mL/min/{1.73_m2} — ABNORMAL LOW (ref >59)

## 2021-04-13 LAB — CBC
Hematocrit: 40 % (ref 34.0–46.6)
Hemoglobin: 13.6 g/dL (ref 11.1–15.9)
MCH: 31.3 pg (ref 26.6–33.0)
MCHC: 34 g/dL (ref 31.5–35.7)
MCV: 92 fL (ref 79–97)
Platelets: 380 10*3/uL (ref 150–450)
RBC: 4.34 x10E6/uL (ref 3.77–5.28)
RDW: 12.6 % (ref 11.7–15.4)
WBC: 9.7 10*3/uL (ref 3.4–10.8)

## 2021-04-15 ENCOUNTER — Other Ambulatory Visit: Payer: Self-pay | Admitting: *Deleted

## 2021-04-15 DIAGNOSIS — I5033 Acute on chronic diastolic (congestive) heart failure: Secondary | ICD-10-CM

## 2021-04-15 DIAGNOSIS — R55 Syncope and collapse: Secondary | ICD-10-CM

## 2021-04-15 DIAGNOSIS — Z8639 Personal history of other endocrine, nutritional and metabolic disease: Secondary | ICD-10-CM

## 2021-04-16 ENCOUNTER — Telehealth: Payer: Self-pay | Admitting: Physician Assistant

## 2021-04-16 NOTE — Telephone Encounter (Signed)
The patient was seen by [name], PA on 04/12/21. She needed to have a CT of the chest for lung cancer screening scheduled.  I have reached out to the patient to discuss what may be good dates/ times for her to have this done.  She advised :  October 10, 11, 12, 13, 24, 25, 26, or 27.  I have called radiology scheduling and have set the patient up to have this done on Monday 05/13/21 at 11:30 am (arrive at 11:15 am) at the [facility] on [address].   The patient has been made aware of this appointment date/ time/ location. I have have also advised her she may call [phone] to reschedule if needed.  There are no special instructions for the patient prior to this scan.  The patient voices understanding and is agreeable.   Message sent to precert as well.

## 2021-04-19 ENCOUNTER — Other Ambulatory Visit (INDEPENDENT_AMBULATORY_CARE_PROVIDER_SITE_OTHER): Payer: Medicare PPO

## 2021-04-19 ENCOUNTER — Other Ambulatory Visit: Payer: Self-pay

## 2021-04-19 DIAGNOSIS — I5033 Acute on chronic diastolic (congestive) heart failure: Secondary | ICD-10-CM | POA: Diagnosis not present

## 2021-04-19 DIAGNOSIS — Z8639 Personal history of other endocrine, nutritional and metabolic disease: Secondary | ICD-10-CM | POA: Diagnosis not present

## 2021-04-19 DIAGNOSIS — R55 Syncope and collapse: Secondary | ICD-10-CM | POA: Diagnosis not present

## 2021-04-20 LAB — BASIC METABOLIC PANEL
BUN/Creatinine Ratio: 17 (ref 12–28)
BUN: 29 mg/dL — ABNORMAL HIGH (ref 8–27)
CO2: 21 mmol/L (ref 20–29)
Calcium: 9.5 mg/dL (ref 8.7–10.3)
Chloride: 99 mmol/L (ref 96–106)
Creatinine, Ser: 1.67 mg/dL — ABNORMAL HIGH (ref 0.57–1.00)
Glucose: 179 mg/dL — ABNORMAL HIGH (ref 70–99)
Potassium: 4.9 mmol/L (ref 3.5–5.2)
Sodium: 139 mmol/L (ref 134–144)
eGFR: 32 mL/min/{1.73_m2} — ABNORMAL LOW (ref >59)

## 2021-04-20 LAB — CBC
Hematocrit: 39.6 % (ref 34.0–46.6)
Hemoglobin: 13.6 g/dL (ref 11.1–15.9)
MCH: 31.3 pg (ref 26.6–33.0)
MCHC: 34.3 g/dL (ref 31.5–35.7)
MCV: 91 fL (ref 79–97)
Platelets: 320 10*3/uL (ref 150–450)
RBC: 4.35 x10E6/uL (ref 3.77–5.28)
RDW: 13.1 % (ref 11.7–15.4)
WBC: 9.4 10*3/uL (ref 3.4–10.8)

## 2021-04-22 ENCOUNTER — Ambulatory Visit (INDEPENDENT_AMBULATORY_CARE_PROVIDER_SITE_OTHER): Payer: Medicare PPO

## 2021-04-22 ENCOUNTER — Other Ambulatory Visit: Payer: Self-pay | Admitting: Physician Assistant

## 2021-04-22 ENCOUNTER — Telehealth: Payer: Self-pay | Admitting: Physician Assistant

## 2021-04-22 ENCOUNTER — Other Ambulatory Visit: Payer: Self-pay

## 2021-04-22 DIAGNOSIS — I10 Essential (primary) hypertension: Secondary | ICD-10-CM

## 2021-04-22 DIAGNOSIS — E1151 Type 2 diabetes mellitus with diabetic peripheral angiopathy without gangrene: Secondary | ICD-10-CM

## 2021-04-22 NOTE — Progress Notes (Signed)
Chronic Care Management Pharmacy Note  04/22/2021 Name:  Sarah Payne MRN:  [id number] DOB:  Aug 18, 1945  Summary: Discussed diabetes and HTN. Patient is experiencing episodes of syncope which she is following closely with cardiology. Recent CMP, Lasix and spironolactone on hold for 1 week from today. She is also experiencing low blood glucose (40-70) about 2 hours after lunch several times/week. Reports taking Novolog 15-22 units before lunch. Discussed ways to prevent lows including avoid alcohol, takes 5-10 minutes before meal, do not skip meals, do not take more than recommended dose.   Recommendations: Rule of 15 reviewed. Recommend reducing lunchtime Novolog dose from 22 units to 15 units and adjust by increments of 1-2 units as needed to prevent low BG.  Plan:  -CCM call for dietary log in 2 weeks -PCP visit 05/21/21 -Provided CCM contact number again for questions/concerns   Subjective: Sarah Payne is an 75 y.o. year old female who is a primary patient of [name], NP.  The CCM team was consulted for assistance with disease management and care coordination needs.    Engaged with patient by telephone for follow up visit in response to provider referral for pharmacy case management and/or care coordination services.   Consent to Services:  The patient was given information about Chronic Care Management services, agreed to services, and gave verbal consent prior to initiation of services.  Please see initial visit note for detailed documentation.   Patient Care Team: [name], NP as PCP - General (Internal Medicine) Rockey Situ Kathlene November, MD as PCP - Cardiology (Cardiology) Minna Merritts, MD (Cardiology) Inda Castle, MD (Inactive) as Consulting Physician (Gastroenterology) Meade Maw, MD as Consulting Physician (Neurosurgery) Philemon Kingdom, MD as Consulting Physician (Internal Medicine) Rockey Situ Kathlene November, MD as Consulting Physician  (Cardiology) [name], [facility] as Pharmacist (Pharmacist) Birder Robson, MD as Referring Physician (Ophthalmology)  Recent office visits: None since last CCM contact   Recent consult visits:  04/22/21 - [name], [place] (Cardiology) - Telephone - Advised her to hold her spironolactone, lasix, and potassium supplementation again and cut her vasotec in half until we are able to repeat a BMET again in 1 week. Monitor blood pressure  04/03/2021 [name], PA (Cardiology) - Telephone - Patient called in office as she passed out and her BP was 83/63; feeling weak and exhausted, when stands up everything goes black. Patient not in any pain. Diarrhea x2 weeks; vomited acid on this date. Advised to hydrate today, elevate her lower extremities when sitting or laying and change positions slowly.   Hospital visits: None in previous 6 months  Objective:  Lab Results  Component Value Date   CREATININE 1.67 (H) 04/19/2021   BUN 29 (H) 04/19/2021   GFR 44.80 (L) 08/03/2020   GFRNONAA 43 (L) 01/08/2021   GFRAA 55 (L) 08/30/2020   NA 139 04/19/2021   K 4.9 04/19/2021   CALCIUM 9.5 04/19/2021   CO2 21 04/19/2021    Lab Results  Component Value Date/Time   HGBA1C 8.4 (A) 02/15/2021 09:17 AM   HGBA1C 8.5 (A) 11/14/2020 11:14 AM   HGBA1C 10.3 (H) 12/24/2018 12:27 PM   HGBA1C 10.4 (H) 09/16/2016 11:53 AM   HGBA1C 10.9 (H) 08/02/2014 04:08 AM   HGBA1C 7.3 (H) 08/19/2013 12:19 PM   GFR 44.80 (L) 08/03/2020 10:05 AM   GFR 42.32 (L) 06/04/2020 09:29 AM   MICROALBUR 1.2 05/30/2013 04:48 PM    Last diabetic Eye exam:  Lab Results  Component Value Date/Time   HMDIABEYEEXA No Retinopathy 10/10/2020 12:00 AM    Last diabetic Foot exam:  02/01/20 with PCP   Lab Results  Component Value Date   CHOL 102 08/03/2020   HDL 38.70 (L) 08/03/2020   LDLCALC 29 08/03/2020   LDLDIRECT 143.0 12/24/2018   TRIG 171.0 (H) 08/03/2020   CHOLHDL 3 08/03/2020    Hepatic Function Latest Ref  Rng & Units 08/03/2020 08/29/2019 12/24/2018  Total Protein 6.0 - 8.3 g/dL 6.3 6.3 7.1  Albumin 3.5 - 5.2 g/dL 4.2 4.1 4.3  AST 0 - 37 U/L _0 ALT 0 - 35 U/L _1 Alk Phosphatase 39 - 117 U/L 68 91 80  Total Bilirubin 0.2 - 1.2 mg/dL 0.5 0.4 0.4  Bilirubin, Direct 0.0 - 0.3 mg/dL - - -    Lab Results  Component Value Date/Time   TSH 1.140 01/04/2021 12:11 PM   TSH 0.21 (L) 11/14/2020 11:35 AM   FREET4 1.34 01/04/2021 12:11 PM   FREET4 0.95 01/18/2018 10:40 AM    CBC Latest Ref Rng & Units 04/19/2021 04/12/2021 08/30/2020  WBC 3.4 - 10.8 x10E3/uL 9.4 9.7 8.4  Hemoglobin 11.1 - 15.9 g/dL 13.6 13.6 13.8  Hematocrit 34.0 - 46.6 % 39.6 40.0 41.5  Platelets 150 - 450 x10E3/uL 320 380 242    Lab Results  Component Value Date/Time   VD25OH 41 02/25/2013 11:37 AM    Clinical ASCVD: Yes  The ASCVD Risk score (Arnett DK, et al., 2019) failed to calculate for the following reasons:   The patient has a prior MI or stroke diagnosis    Depression screen [facility] 2/9 02/06/2021 04/02/2020 05/12/2018  Decreased Interest 0 3 0  Down, Depressed, Hopeless 0 3 0  PHQ - 2 Score 0 6 0  Altered sleeping 0 3 -  Tired, decreased energy 0 3 -  Change in appetite 0 0 -  Feeling bad or failure about yourself  0 0 -  Trouble concentrating 0 0 -  Moving slowly or fidgety/restless 0 0 -  Suicidal thoughts 0 0 -  PHQ-9 Score 0 12 -  Difficult doing work/chores Not difficult at all Extremely dIfficult -  Some recent data might be hidden    Social History   Tobacco Use  Smoking Status Every Day   Packs/day: 1.00   Years: 45.00   Pack years: 45.00   Types: Cigarettes  Smokeless Tobacco Never  Tobacco Comments   Has cut back, trying to quit.    BP Readings from Last 3 Encounters:  04/12/21 (!) 141/82  02/15/21 (!) 144/70  01/16/21 (!) 154/101   Pulse Readings from Last 3 Encounters:  04/12/21 69  02/15/21 71  01/16/21 70   Wt Readings from Last 3 Encounters:  04/12/21 153 lb 6 oz (69.6  kg)  02/15/21 154 lb (69.9 kg)  01/16/21 154 lb (69.9 kg)    Assessment/Interventions: Review of patient past medical history, allergies, medications, health status, including review of consultants reports, laboratory and other test data, was performed as part of comprehensive evaluation and provision of chronic care management services.   SDOH:  (Social Determinants of Health) assessments and interventions performed: Yes    CCM Care Plan  Allergies  Allergen Reactions   No Known Allergies     Medications Reviewed Today     Reviewed by Britt Bottom, CMA (Certified Medical Assistant) on 04/12/21 at 1122  Med List Status: <None>   Medication Order Taking? Sig  Documenting Provider Last Dose Status Informant  albuterol (VENTOLIN HFA) 108 (90 Base) MCG/ACT inhaler [id number] No Inhale 2 puffs into the lungs every 6 (six) hours as needed for wheezing or shortness of breath. Minna Merritts, MD Taking Active Self  amitriptyline (ELAVIL) 50 MG tablet [id number] No Take 50 mg by mouth at bedtime as needed for sleep. [provider] Taking Active   carvedilol (COREG) 6.25 MG tablet [id number] No Take 1 tablet (6.25 mg total) by mouth 2 (two) times daily. [name] D, PA-C Taking Active   clopidogrel (PLAVIX) 75 MG tablet [id number] No Take 1 tablet (75 mg total) by mouth daily. [name] D, PA-C Taking Active   Continuous Blood Gluc Receiver (FREESTYLE LIBRE 2 READER) [place] [id number]  Use with sensor to check blood sugars 6 times daily. [name], NP  Active   Continuous Blood Gluc Sensor (FREESTYLE LIBRE 2 SENSOR) [place] [id number]  Use to check blood sugars 6 times daily. [name], NP  Active   enalapril (VASOTEC) 20 MG tablet [id number] No Take 1 tablet (20 mg total) by mouth daily. [name] D, PA-C Taking Active   ezetimibe (ZETIA) 10 MG tablet [id number] No Take 1 tablet (10 mg total) by mouth daily. For cholesterol. [name] D, PA-C  Taking Active   furosemide (LASIX) 20 MG tablet [id number] No Take 1 tablet (20 mg total) by mouth daily as needed for edema. [name] D, PA-C Taking Active   gabapentin (NEURONTIN) 300 MG capsule [id number] No Take 1 capsule (300 mg total) by mouth 3 (three) times daily. Gillis Santa, MD Taking Active   glucose blood (FREESTYLE LITE) test strip [id number] No Test up to four times a day as needed. DX [name], NP Taking Active   insulin aspart (NOVOLOG FLEXPEN) 100 UNIT/ML FlexPen [id number] No Inject 22 units with breakfast and lunch, inject 14 units with dinner. [name], NP Taking Active   insulin glargine (LANTUS SOLOSTAR) 100 UNIT/ML Solostar Pen [id number] No Inject 60 Units into the skin at bedtime. [name], NP Taking Active   Insulin Pen Needle (INSUPEN PEN NEEDLES) 32G X 4 MM MISC [id number] No Use to inject insulin 4 times daily. [name], NP Taking Active Self  isosorbide mononitrate (IMDUR) 30 MG 24 hr tablet [id number]  Take 1 tablet (30 mg) by mouth once daily at bedtime Minna Merritts, MD  Active   levothyroxine (SYNTHROID) 100 MCG tablet [id number] No Take 1 tablet by mouth on an empty stomach with water only on Sunday, Monday, and Tuesday. No food or other medications for 30 minutes. [name], NP Taking Active   levothyroxine (SYNTHROID) 88 MCG tablet [id number] No Take 1 tablet by mouth every morning on an empty stomach with water only on Wednesday, Thursday, Friday, Saturday.  No food or other medications for 30 minutes. [name], NP Taking Active   meloxicam (MOBIC) 15 MG tablet [id number] No Take 15 mg by mouth daily. [provider] Taking Active   neomycin-polymyxin b-dexamethasone (MAXITROL) 3.5-10000-0.1 SUSP [id number] No Place 1 drop into the left eye 3 (three) times daily. [provider] Taking Active Self  potassium chloride (KLOR-CON) 10 MEQ tablet [id number] No Only take on days you take your  lasix. Take 1 tablet (10 mEq total) by mouth for 1 dose on those days you take your lasix 20m pill. VMarrianne MoodD, PA-C Taking Active   rosuvastatin (CRESTOR) 40 MG tablet [id number] Take 1  tablet (40 mg total) by mouth every evening. For cholesterol. [name] D, PA-C Taking Active   spironolactone (ALDACTONE) 25 MG tablet [id number] No Take 1 tablet (25 mg total) by mouth daily. [name] D, PA-C Taking Active   traZODone (DESYREL) 100 MG tablet [id number]  TAKE 1 TABLET BY MOUTH AT BEDTIME AS NEEDED FOR SLEEP [name], NP  Active   Med List Note Landis Martins, [place] 02/11/18 [id number]): 02-11-18 Message sent to Dr. Rockey Situ in Epic for permission to stop Plavix 7 days. DW            Patient Active Problem List   Diagnosis Date Noted   Preventative health care 02/15/2021   Abdominal bloating 03/22/2019   Vaginal burning 03/22/2019   Foul smelling urine 03/22/2019   Acute pain of left shoulder 01/17/2019   Chronic musculoskeletal pain 11/15/2018   Atherosclerosis of native coronary artery of native heart with stable angina pectoris ([place]) 05/17/2018   Chronic neck pain 02/10/2018   Chronic pain of right upper extremity 02/10/2018   Cervical spondylosis 02/10/2018   Neck pain 01/19/2018   Right shoulder pain 01/19/2018   Chronic pain syndrome 01/19/2018   Lumbar degenerative disc disease 09/29/2017   Lumbar spondylosis with myelopathy 09/29/2017   Chronic bilateral back pain 05/19/2017   Insomnia 01/12/2017   Cannabis use disorder, mild, abuse 09/03/2016   Cognitive changes 09/03/2016   Takotsubo cardiomyopathy 01/02/2016   Frequent PVCs 01/02/2016   HTN (hypertension) 09/20/2015   Benign neoplasm of colon 11/02/2014   Internal hemorrhoids 11/02/2014   Adjustment disorder with mixed anxiety and depressed mood 08/16/2014   Esophageal reflux 05/23/2014   Diverticulosis of colon without hemorrhage 05/17/2014   Peripheral polyneuropathy 05/05/2014    Tobacco dependence 05/05/2014   COPD, moderate ([place]) 03/29/2013   Noncompliance with diabetes treatment 02/27/2013   Obstructive sleep apnea 95/03/3266   Chronic systolic congestive heart failure ([place]) 12/45/8099   Lichen sclerosus et atrophicus 05/20/2012   Bronchitis, chronic obstructive ([place]) 05/20/2012   Diabetes mellitus type 2 with peripheral artery disease ([place]) 08/28/2011   History of lumbar fusion 06/29/2011   Depression 06/24/2011   Hypothyroidism 05/03/2011   CAD, multiple vessel 02/24/2011   Tobacco abuse 02/24/2011   Mixed hyperlipidemia 02/24/2011   Postural dizziness with near syncope 02/24/2011    Immunization History  Administered Date(s) Administered   Fluad Quad(high Dose 65+) 04/02/2020   Influenza Split 05/20/2012   Influenza, High Dose Seasonal PF 04/17/2016, 04/17/2017, 05/04/2018   Influenza, Seasonal, Injecte, Preservative Fre 05/07/2006   Influenza,inj,Quad PF,6+ Mos 04/20/2013, 04/26/2014, 06/01/2015, 04/25/2019   PFIZER(Purple Top)SARS-COV-2 Vaccination 09/15/2019, 10/11/2019, 06/05/2020   Pneumococcal Conjugate-13 04/26/2014   Pneumococcal Polysaccharide-23 03/28/2013, 04/25/2019   Tdap 03/28/2013    Conditions to be addressed/monitored:  Hypertension and Diabetes  Current Barriers:  Unable to achieve control of diabetes and blood pressure   Pharmacist Clinical Goal(s):  Patient will achieve control of diabetes and blood pressure as evidenced by home BG /BP log through collaboration with PharmD and provider.   Interventions: 1:1 collaboration with [name], NP regarding development and update of comprehensive plan of care as evidenced by provider attestation and co-signature Inter-disciplinary care team collaboration (see longitudinal plan of care) Comprehensive medication review performed; medication list updated in electronic medical record  Hypertension (BP goal <130/80) -Not ideally controlled - per last clinic reading, syncope -  see cardio notes 04/22/21 -Current treatment: Enalapril 20 mg - 1/2 tablet daily  Carvedilol 6.6m - 1 BID Isosorbide  mononitrate 30 mg - 1 tablet daily -Medications previously tried: Lasix and spironolactone on hold x 1 week 04/22/21 -Current home readings: none to report today -See recent cardio notes - pt should home monitor. Recommend tobacco and alcohol cessation. -Counseled to monitor BP at home with symptoms, document, and provide log at future appointments -Recommended to continue current medication; Keep record of home BP daily.  Hyperlipidemia: (LDL goal < 70) -Controlled - LDL 29 -Current treatment: Rosuvastatin 40 mg - 1 tablet daily Zetia 10 mg - 1 tablet daily  -Medications previously tried: none -Recommended to continue current medication  Diabetes (A1c goal <7%) -Uncontrolled - improving, A1c 8.4% 729/2022, reports lows after lunch without explanation -Current medications: Novolog flexpen - Inject 22 before breakfast and lunch and 14 before dinner Lantus solostar - Inject 60 units every evening (11 PM) -Medications previously tried: none -Current home glucose readings: She uses the [employer] 2 system with reader. She has an alarm when it reaches 70. She scans about 6-7/day. Fasting: 200-250 in morning Before lunch 200-250 2 hours after lunch: 50-70 -Current meal patterns: She admits to drinking a pepsi if BG is "normal". She has been drinking more sprite lately. - Breakfast: Around 11 AM slice of toast with butter or blackberry jam, coffee with splenda, french vanilla creamer with no sugar.  - Lunch: 2-3 PM in afternoon, light lunch. Eats out 2-3 days per week. - Evening meal: 6:30-7 PM - cooks meat and 2 vegetables 7 days a week (ex: chicken casserole with green beans, potatoes) - Snacks: about 2 days per week, she has 1/2 hershey bar - Drinks: water, coffee, sprite zero, diet pepsi.  -Recommended to reduce Novolog before lunch to 15 units and further if  continued hypoglycemia. Please keep dietary and insulin log. Avoid sodas.  Patient Goals/Self-Care Activities Over the next 90 days, patient will:  - check glucose before meals and bedtime, document, and provide at future appointments - check blood pressure at least once weekly, call if > 140/90 on repeat check  Follow Up Plan:  -CCM call for dietary log in 2 weeks  -PCP visit 05/21/21  Medication Assistance: None required.  Patient affirms current coverage meets needs.  Star Rating Drugs:  Medication:                Last Fill:         Day Supply Rosuvastatin 53m    01/17/2021           90 Enalapril                      01/17/2021      90                                 Lantus                         02/05/2021      75 Novolog                      02/05/2021     77 Verified fill dates with [facility]   [facility]  [facility] [place]Phone: [phone]: [phone] [facility] [place]  [address] Phone: [phone] Fax: [phone]  [facility] [place] - [address] [place] [address] Phone: [phone] Fax: [phone]  Reports medication compliance. Most meds from [place] at no cost.   Care Plan and Follow Up Patient Decision:  Patient agrees to Care Plan and Follow-up.  [name], PharmD Clinical Pharmacist [facility] at [facility] [phone]

## 2021-04-22 NOTE — Telephone Encounter (Signed)
Spoke with patient regarding the lab result. Advised her to hold her spironolactone, lasix, and potassium supplementation again and cut her vasotec in half until we are able to repeat a BMET again in 1 week. Given her blood pressure has been elevated, we discussed that she should monitor her BP and call the office if her BP is consistently elevated above 160 on the top or 90 on the bottom with pt understanding.

## 2021-04-22 NOTE — Patient Instructions (Signed)
Visit Information  Patient Care Plan: CCM Pharmacy Care Plan     Problem Identified: CHL AMB "PATIENT-SPECIFIC PROBLEM"      Long-Range Goal: Disease Management   Start Date: 09/26/2020  This Visit's Progress: Not on track  Recent Progress: On track  Priority: High  Note:    Current Barriers:  Unable to achieve control of diabetes and blood pressure - improving  Pharmacist Clinical Goal(s):  Patient will achieve control of diabetes and blood pressure as evidenced by home BG /BP log through collaboration with PharmD and provider.   Interventions: 1:1 collaboration with [name], NP regarding development and update of comprehensive plan of care as evidenced by provider attestation and co-signature Inter-disciplinary care team collaboration (see longitudinal plan of care) Comprehensive medication review performed; medication list updated in electronic medical record  Hypertension (BP goal <130/80) -Not ideally controlled - per last clinic reading, syncope - see cardio notes 04/22/21 -Current treatment: Enalapril 20 mg - 1/2 tablet daily  Carvedilol 6.25mg  - 1 BID Isosorbide mononitrate 30 mg - 1 tablet daily -Medications previously tried: Lasix and spironolactone on hold x 1 week 04/22/21 -Current home readings: none to report today -See recent cardio notes - pt should home monitor. Recommend tobacco and alcohol cessation. -Counseled to monitor BP at home with symptoms, document, and provide log at future appointments -Recommended to continue current medication; Keep record of home BP daily.  Hyperlipidemia: (LDL goal < 70) -Controlled - LDL 29 -Current treatment: Rosuvastatin 40 mg - 1 tablet daily Zetia 10 mg - 1 tablet daily  -Medications previously tried: none -Recommended to continue current medication  Diabetes (A1c goal <7%) -Uncontrolled - improving, A1c 8.4% 729/2022, reports lows after lunch without explanation -Current medications: Novolog flexpen -  Inject 22 before breakfast and lunch and 14 before dinner Lantus solostar - Inject 60 units every evening (11 PM) -Medications previously tried: none -Current home glucose readings: She uses the [employer] 2. She uses the reader. She has an alarm when it reaches 70. She scans about 6-7/day. Fasting: 200-250 in morning Before lunch 200-250 2 hours after lunch: 50-70 -Current meal patterns: She admits to drinking a pepsi if BG is "normal". She has been drinking more sprite lately. - Breakfast: Around 11 AM slice of toast with butter or blackberry jam, coffee with splenda, french vanilla creamer with no sugar.  - Lunch: 2-3 PM in afternoon, light lunch. Eats out 2-3 days per week. - Evening meal: 6:30-7 PM - cooks meat and 2 vegetables 7 days a week (ex: chicken casserole with green beans, potatoes) - Snacks: about 2 days per week, she has 1/2 hershey bar - Drinks: water, coffee, sprite zero, diet pepsi.  -Recommended to reduce Novolog before lunch to 15 units for now. Please keep dietary and insulin log. Avoid sodas.  Patient Goals/Self-Care Activities Over the next 90 days, patient will:  - check glucose before meals and bedtime, document, and provide at future appointments - check blood pressure at least once weekly, call if > 140/90 on repeat check  Follow Up Plan:  -CMA call for dietary log in 2 weeks  -PCP visit 05/21/21      Patient verbalizes understanding of instructions provided today and agrees to view in [place].   [name], PharmD Clinical Pharmacist [facility] at [facility] [phone]

## 2021-04-30 ENCOUNTER — Telehealth: Payer: Self-pay

## 2021-04-30 NOTE — Chronic Care Management (AMB) (Addendum)
Chronic Care Management Pharmacy Assistant   Name: Sarah Payne  MRN: [id number] DOB: 16-Oct-1945  Reason for Encounter: Reminder Call   Conditions to be addressed/monitored: CAD, HTN, COPD, and DMII   Medications: Outpatient Encounter Medications as of 04/30/2021  Medication Sig   albuterol (VENTOLIN HFA) 108 (90 Base) MCG/ACT inhaler Inhale 2 puffs into the lungs every 6 (six) hours as needed for wheezing or shortness of breath.   amitriptyline (ELAVIL) 50 MG tablet Take 50 mg by mouth at bedtime as needed for sleep.   carvedilol (COREG) 6.25 MG tablet Take 1 tablet (6.25 mg total) by mouth 2 (two) times daily.   clopidogrel (PLAVIX) 75 MG tablet Take 1 tablet (75 mg total) by mouth daily.   Continuous Blood Gluc Receiver (FREESTYLE LIBRE 2 READER) DEVI Use with sensor to check blood sugars 6 times daily.   Continuous Blood Gluc Sensor (FREESTYLE LIBRE 2 SENSOR) MISC Use to check blood sugars 6 times daily.   enalapril (VASOTEC) 20 MG tablet Take 1 tablet (20 mg total) by mouth daily.   ezetimibe (ZETIA) 10 MG tablet Take 1 tablet (10 mg total) by mouth daily. For cholesterol.   furosemide (LASIX) 20 MG tablet Take 1 tablet (20 mg total) by mouth daily as needed for edema.   gabapentin (NEURONTIN) 300 MG capsule Take 1 capsule (300 mg total) by mouth 3 (three) times daily.   glucose blood (FREESTYLE LITE) test strip Test up to four times a day as needed. DX   insulin aspart (NOVOLOG FLEXPEN) 100 UNIT/ML FlexPen Inject 22 units with breakfast and lunch, inject 14 units with dinner.   insulin glargine (LANTUS SOLOSTAR) 100 UNIT/ML Solostar Pen Inject 60 Units into the skin at bedtime.   Insulin Pen Needle (INSUPEN PEN NEEDLES) 32G X 4 MM MISC Use to inject insulin 4 times daily.   isosorbide mononitrate (IMDUR) 30 MG 24 hr tablet Take 1 tablet (30 mg) by mouth once daily at bedtime   levothyroxine (SYNTHROID) 100 MCG tablet Take 1 tablet by mouth on an empty stomach with water only  on Sunday, Monday, and Tuesday. No food or other medications for 30 minutes.   levothyroxine (SYNTHROID) 88 MCG tablet Take 1 tablet by mouth every morning on an empty stomach with water only on Wednesday, Thursday, Friday, Saturday.  No food or other medications for 30 minutes.   meloxicam (MOBIC) 15 MG tablet Take 15 mg by mouth daily.   neomycin-polymyxin b-dexamethasone (MAXITROL) 3.5-10000-0.1 SUSP Place 1 drop into the left eye 3 (three) times daily.   potassium chloride (KLOR-CON) 10 MEQ tablet Only take on days you take your lasix. Take 1 tablet (10 mEq total) by mouth for 1 dose on those days you take your lasix 20mg  pill.   rosuvastatin (CRESTOR) 40 MG tablet Take 1 tablet (40 mg total) by mouth every evening. For cholesterol.   spironolactone (ALDACTONE) 25 MG tablet Take 1 tablet (25 mg total) by mouth daily.   traZODone (DESYREL) 100 MG tablet TAKE 1 TABLET BY MOUTH AT BEDTIME AS NEEDED FOR SLEEP   No facility-administered encounter medications on file as of 04/30/2021.   Sarah Payne did not answer the telephone to remind her of her upcoming telephone visit with [name] on 05/07/21 at 3:30pm. Patient was reminded to have all medications, supplements and any blood glucose and blood pressure readings available for review at appointment.  No answer, no voicemail available.   Star Rating Drugs: Medication:  Last Fill: Day  Supply Novolog  11/27/20 -     champva meds by mail Lantus   08/08/20 25  champva  meds by mail  Rosuvastatin 40mg  04/12/21 90  champva meds by mail  [name], CPP notified  Avel Sensor, [place] Assistant [phone]  I have reviewed the care management and care coordination activities outlined in this encounter and I am certifying that I agree with the content of this note. No further action required.  [name], PharmD Clinical Pharmacist [facility] [phone]

## 2021-05-07 ENCOUNTER — Telehealth: Payer: Medicare PPO

## 2021-05-08 DIAGNOSIS — I471 Supraventricular tachycardia, unspecified: Secondary | ICD-10-CM

## 2021-05-08 HISTORY — DX: Supraventricular tachycardia: I47.1

## 2021-05-08 HISTORY — DX: Supraventricular tachycardia, unspecified: I47.10

## 2021-05-13 ENCOUNTER — Ambulatory Visit
Admission: RE | Admit: 2021-05-13 | Discharge: 2021-05-13 | Disposition: A | Discharge status: Home / Self Care | Payer: Medicare PPO | Source: Ambulatory Visit | Attending: Physician Assistant | Admitting: Physician Assistant

## 2021-05-13 ENCOUNTER — Other Ambulatory Visit: Payer: Self-pay

## 2021-05-13 DIAGNOSIS — R911 Solitary pulmonary nodule: Secondary | ICD-10-CM | POA: Diagnosis not present

## 2021-05-13 DIAGNOSIS — F17210 Nicotine dependence, cigarettes, uncomplicated: Secondary | ICD-10-CM | POA: Insufficient documentation

## 2021-05-13 DIAGNOSIS — Z122 Encounter for screening for malignant neoplasm of respiratory organs: Secondary | ICD-10-CM | POA: Insufficient documentation

## 2021-05-13 DIAGNOSIS — I2510 Atherosclerotic heart disease of native coronary artery without angina pectoris: Secondary | ICD-10-CM | POA: Insufficient documentation

## 2021-05-13 DIAGNOSIS — I700 Atherosclerosis of aorta: Secondary | ICD-10-CM | POA: Diagnosis not present

## 2021-05-15 ENCOUNTER — Other Ambulatory Visit: Payer: Self-pay

## 2021-05-15 ENCOUNTER — Emergency Department: Payer: Medicare PPO

## 2021-05-15 ENCOUNTER — Emergency Department
Admission: EM | Admit: 2021-05-15 | Discharge: 2021-05-15 | Disposition: A | Discharge status: Home / Self Care | Payer: Medicare PPO | Attending: Emergency Medicine | Admitting: Emergency Medicine

## 2021-05-15 DIAGNOSIS — E1151 Type 2 diabetes mellitus with diabetic peripheral angiopathy without gangrene: Secondary | ICD-10-CM | POA: Diagnosis not present

## 2021-05-15 DIAGNOSIS — I130 Hypertensive heart and chronic kidney disease with heart failure and stage 1 through stage 4 chronic kidney disease, or unspecified chronic kidney disease: Secondary | ICD-10-CM | POA: Diagnosis not present

## 2021-05-15 DIAGNOSIS — S46912A Strain of unspecified muscle, fascia and tendon at shoulder and upper arm level, left arm, initial encounter: Secondary | ICD-10-CM | POA: Diagnosis not present

## 2021-05-15 DIAGNOSIS — E039 Hypothyroidism, unspecified: Secondary | ICD-10-CM | POA: Insufficient documentation

## 2021-05-15 DIAGNOSIS — Z794 Long term (current) use of insulin: Secondary | ICD-10-CM | POA: Diagnosis not present

## 2021-05-15 DIAGNOSIS — S4992XA Unspecified injury of left shoulder and upper arm, initial encounter: Secondary | ICD-10-CM | POA: Diagnosis present

## 2021-05-15 DIAGNOSIS — Z85038 Personal history of other malignant neoplasm of large intestine: Secondary | ICD-10-CM | POA: Insufficient documentation

## 2021-05-15 DIAGNOSIS — Z79899 Other long term (current) drug therapy: Secondary | ICD-10-CM | POA: Insufficient documentation

## 2021-05-15 DIAGNOSIS — F0390 Unspecified dementia without behavioral disturbance: Secondary | ICD-10-CM | POA: Diagnosis not present

## 2021-05-15 DIAGNOSIS — J449 Chronic obstructive pulmonary disease, unspecified: Secondary | ICD-10-CM | POA: Insufficient documentation

## 2021-05-15 DIAGNOSIS — I5021 Acute systolic (congestive) heart failure: Secondary | ICD-10-CM | POA: Insufficient documentation

## 2021-05-15 DIAGNOSIS — F17210 Nicotine dependence, cigarettes, uncomplicated: Secondary | ICD-10-CM | POA: Diagnosis not present

## 2021-05-15 DIAGNOSIS — X58XXXA Exposure to other specified factors, initial encounter: Secondary | ICD-10-CM | POA: Diagnosis not present

## 2021-05-15 DIAGNOSIS — N189 Chronic kidney disease, unspecified: Secondary | ICD-10-CM | POA: Diagnosis not present

## 2021-05-15 DIAGNOSIS — M19012 Primary osteoarthritis, left shoulder: Secondary | ICD-10-CM | POA: Diagnosis not present

## 2021-05-15 DIAGNOSIS — Z955 Presence of coronary angioplasty implant and graft: Secondary | ICD-10-CM | POA: Diagnosis not present

## 2021-05-15 DIAGNOSIS — I25119 Atherosclerotic heart disease of native coronary artery with unspecified angina pectoris: Secondary | ICD-10-CM | POA: Insufficient documentation

## 2021-05-15 MED ORDER — LIDOCAINE 5 % EX PTCH: 1.0000 | MEDICATED_PATCH | Freq: Two times a day (BID) | CUTANEOUS | 0 refills | Status: AC

## 2021-05-15 MED ORDER — LIDOCAINE 5 % EX PTCH
1.0000 | MEDICATED_PATCH | CUTANEOUS | Status: DC
  Administered 2021-05-15: 1 via TRANSDERMAL
  Filled 2021-05-15: qty 1

## 2021-05-15 NOTE — Discharge Instructions (Addendum)
Call the orthopedic number to get an ER follow-up.  Take tylenol 1 g every 6-8 hours and use the lidocaine patches for pain as well.  Try to avoid ibuprofen, NSAIDs, Motrin  IMPRESSION: Advanced left glenohumeral osteoarthrosis.

## 2021-05-15 NOTE — ED Triage Notes (Addendum)
Pt c/o left shoulder pain, reports surgery to left should 5 years ago, pt reports hx of "frozen should". Pt reports feeling like bone is out of place in shoulder and unable to raise arm out to side, but can move it anteriorly and posteriorly. NAD noted at this time.

## 2021-05-15 NOTE — ED Provider Notes (Signed)
[facility] Emergency Department Provider Note  ____________________________________________   Event Date/Time   First MD Initiated Contact with Patient 05/15/21 1534     (approximate)  I have reviewed the triage vital signs and the nursing notes.   HISTORY  Chief Complaint Shoulder Pain    HPI COBI ALDAPE is a 75 y.o. female with CHF, hypertension, hyperlipidemia, CKD who comes in with concerns for left shoulder pain.  Patient reports left shoulder pain for over a month.  Reports having shoulder surgery 5 years ago and having a frozen shoulder.  She states that this does not feel as bad as that but it is getting similar.  She reports decreased movements in the shoulder secondary to pain has been taking some ibuprofen, nothing makes it better, worse with movement.  Denies any chest pain, shortness of breath.  Does not remember who to follow-up with from her orthopedic team           Past Medical History:  Diagnosis Date   Acute systolic (congestive) heart failure ([place]) 01/01/2016   Cataract    BILATERAL REMOVED   Chronic low back pain    COPD (chronic obstructive pulmonary disease) (HCC)    Coronary artery disease    a. multiple PCIs to the mLCx. b. stent to dRCA 08/2011 in setting of NSTEMI. c. DES to prox-mid LCx for ISR 09/2011. d.  DESx2 to prox-mRCA 07/2012. e. Takotsubo event 12/2015 with patent stents.   Depression    Diabetes mellitus    Diverticulosis    Frequent PVCs    a. Noted in hospital 12/2015.   GERD (gastroesophageal reflux disease)    Hyperlipidemia    Hypertension    Hypothyroidism    Impingement syndrome of left shoulder    Marijuana abuse    Myocardial infarction (HCC)    x 5   Sleep apnea    mild-does not use cpap   Takotsubo cardiomyopathy    a. 12/2015 - nephew committed suicide 1 week prior, sister died the morning of presentation - initially called a STEMI; cath with patent stents. LVEF 25-30%.   Tendonitis of left  rotator cuff    Tobacco abuse    Vascular dementia     Patient Active Problem List   Diagnosis Date Noted   Preventative health care 02/15/2021   Abdominal bloating 03/22/2019   Vaginal burning 03/22/2019   Foul smelling urine 03/22/2019   Acute pain of left shoulder 01/17/2019   Chronic musculoskeletal pain 11/15/2018   Atherosclerosis of native coronary artery of native heart with stable angina pectoris ([place]) 05/17/2018   Chronic neck pain 02/10/2018   Chronic pain of right upper extremity 02/10/2018   Cervical spondylosis 02/10/2018   Neck pain 01/19/2018   Right shoulder pain 01/19/2018   Chronic pain syndrome 01/19/2018   Lumbar degenerative disc disease 09/29/2017   Lumbar spondylosis with myelopathy 09/29/2017   Chronic bilateral back pain 05/19/2017   Insomnia 01/12/2017   Cannabis use disorder, mild, abuse 09/03/2016   Cognitive changes 09/03/2016   Takotsubo cardiomyopathy 01/02/2016   Frequent PVCs 01/02/2016   HTN (hypertension) 09/20/2015   Benign neoplasm of colon 11/02/2014   Internal hemorrhoids 11/02/2014   Adjustment disorder with mixed anxiety and depressed mood 08/16/2014   Esophageal reflux 05/23/2014   Diverticulosis of colon without hemorrhage 05/17/2014   Peripheral polyneuropathy 05/05/2014   Tobacco dependence 05/05/2014   COPD, moderate ([place]) 03/29/2013   Noncompliance with diabetes treatment 02/27/2013   Obstructive sleep apnea  16/04/9603   Chronic systolic congestive heart failure ([place]) 54/03/8118   Lichen sclerosus et atrophicus 05/20/2012   Bronchitis, chronic obstructive ([place]) 05/20/2012   Diabetes mellitus type 2 with peripheral artery disease ([place]) 08/28/2011   History of lumbar fusion 06/29/2011   Depression 06/24/2011   Hypothyroidism 05/03/2011   CAD, multiple vessel 02/24/2011   Tobacco abuse 02/24/2011   Mixed hyperlipidemia 02/24/2011   Postural dizziness with near syncope 02/24/2011    Past Surgical History:  Procedure  Laterality Date   ABDOMINAL AORTOGRAM W/LOWER EXTREMITY N/A 09/12/2020   Procedure: ABDOMINAL AORTOGRAM W/LOWER EXTREMITY;  Surgeon: Wellington Hampshire, MD;  Location: [facility];  Service: Cardiovascular;  Laterality: N/A;   ABDOMINAL HYSTERECTOMY     APPENDECTOMY     BACK SURGERY     CARDIAC CATHETERIZATION  2013   CARDIAC CATHETERIZATION  2016   CARDIAC CATHETERIZATION N/A 01/01/2016   Procedure: Left Heart Cath and Coronary Angiography;  Surgeon: Jettie Booze, MD;  Location: [facility];  Service: Cardiovascular;  Laterality: N/A;   CATARACT EXTRACTION W/PHACO Left 01/30/2015   Procedure: CATARACT EXTRACTION PHACO AND INTRAOCULAR LENS PLACEMENT (IOC);  Surgeon: Birder Robson, MD;  Location: [facility];  Service: Ophthalmology;  Laterality: Left;  [place] 00:47    CATARACT EXTRACTION W/PHACO Right 02/13/2015   Procedure: CATARACT EXTRACTION PHACO AND INTRAOCULAR LENS PLACEMENT (IOC);  Surgeon: Birder Robson, MD;  Location: [facility];  Service: Ophthalmology;  Laterality: Right;  cassette lot # [id number] H [place]  00:29.9 AP  20.7 CDE  6.20   COLONOSCOPY N/A 11/02/2014   Procedure: COLONOSCOPY;  Surgeon: Inda Castle, MD;  Location: [place];  Service: Endoscopy;  Laterality: N/A;   CORONARY ANGIOPLASTY  2012   stent x 3    CORONARY ANGIOPLASTY WITH STENT PLACEMENT  2013   ESOPHAGOGASTRODUODENOSCOPY (EGD) WITH PROPOFOL N/A 09/21/2018   Procedure: ESOPHAGOGASTRODUODENOSCOPY (EGD) WITH PROPOFOL;  Surgeon: [name], MD;  Location: [facility];  Service: Gastroenterology;  Laterality: N/A;   PERIPHERAL VASCULAR ATHERECTOMY Left 09/12/2020   Procedure: PERIPHERAL VASCULAR ATHERECTOMY;  Surgeon: Wellington Hampshire, MD;  Location: [facility];  Service: Cardiovascular;  Laterality: Left;   PERIPHERAL VASCULAR BALLOON ANGIOPLASTY  09/12/2020   Procedure: PERIPHERAL VASCULAR BALLOON ANGIOPLASTY;  Surgeon: Wellington Hampshire, MD;  Location: [facility];  Service:  Cardiovascular;;   SHOULDER SURGERY Left 2017    Prior to Admission medications   Medication Sig Start Date End Date Taking? Authorizing Provider  albuterol (VENTOLIN HFA) 108 (90 Base) MCG/ACT inhaler Inhale 2 puffs into the lungs every 6 (six) hours as needed for wheezing or shortness of breath. 02/27/20   Minna Merritts, MD  amitriptyline (ELAVIL) 50 MG tablet Take 50 mg by mouth at bedtime as needed for sleep.    [provider]  carvedilol (COREG) 6.25 MG tablet Take 1 tablet (6.25 mg total) by mouth 2 (two) times daily. 04/12/21   [name] D, PA-C  clopidogrel (PLAVIX) 75 MG tablet Take 1 tablet (75 mg total) by mouth daily. 04/12/21   [name] D, PA-C  Continuous Blood Gluc Receiver (FREESTYLE LIBRE 2 READER) DEVI Use with sensor to check blood sugars 6 times daily. 02/16/21   [name], NP  Continuous Blood Gluc Sensor (FREESTYLE LIBRE 2 SENSOR) MISC Use to check blood sugars 6 times daily. 03/28/21   [name], NP  enalapril (VASOTEC) 20 MG tablet Take 1 tablet (20 mg total) by mouth daily. 04/12/21  [name] D, PA-C  ezetimibe (ZETIA) 10 MG tablet Take 1 tablet (10 mg total) by mouth daily. For cholesterol. 04/12/21   [name] D, PA-C  furosemide (LASIX) 20 MG tablet Take 1 tablet (20 mg total) by mouth daily as needed for edema. 04/12/21   [name] D, PA-C  gabapentin (NEURONTIN) 300 MG capsule Take 1 capsule (300 mg total) by mouth 3 (three) times daily. 01/16/21 07/15/21  Gillis Santa, MD  glucose blood (FREESTYLE LITE) test strip Test up to four times a day as needed. DX 11/27/20   [name], NP  insulin aspart (NOVOLOG FLEXPEN) 100 UNIT/ML FlexPen Inject 22 units with breakfast and lunch, inject 14 units with dinner. 11/27/20   [name], NP  insulin glargine (LANTUS SOLOSTAR) 100 UNIT/ML Solostar Pen Inject 60 Units into the skin at bedtime. 11/27/20   [name], NP  Insulin Pen Needle  (INSUPEN PEN NEEDLES) 32G X 4 MM MISC Use to inject insulin 4 times daily. 07/17/20   [name], NP  isosorbide mononitrate (IMDUR) 30 MG 24 hr tablet Take 1 tablet (30 mg) by mouth once daily at bedtime 04/03/21   Minna Merritts, MD  levothyroxine (SYNTHROID) 100 MCG tablet Take 1 tablet by mouth on an empty stomach with water only on Sunday, Monday, and Tuesday. No food or other medications for 30 minutes. 11/15/20   [name], NP  levothyroxine (SYNTHROID) 88 MCG tablet Take 1 tablet by mouth every morning on an empty stomach with water only on Wednesday, Thursday, Friday, Saturday.  No food or other medications for 30 minutes. 11/15/20   [name], NP  meloxicam (MOBIC) 15 MG tablet Take 15 mg by mouth daily.    [provider]  neomycin-polymyxin b-dexamethasone (MAXITROL) 3.5-10000-0.1 SUSP Place 1 drop into the left eye 3 (three) times daily. 07/05/20   [provider]  potassium chloride (KLOR-CON) 10 MEQ tablet Only take on days you take your lasix. Take 1 tablet (10 mEq total) by mouth for 1 dose on those days you take your lasix 20mg  pill. 04/12/21   [name] D, PA-C  rosuvastatin (CRESTOR) 40 MG tablet Take 1 tablet (40 mg total) by mouth every evening. For cholesterol. 04/12/21   [name] D, PA-C  spironolactone (ALDACTONE) 25 MG tablet Take 1 tablet (25 mg total) by mouth daily. 04/12/21   [name] D, PA-C  traZODone (DESYREL) 100 MG tablet TAKE 1 TABLET BY MOUTH AT BEDTIME AS NEEDED FOR SLEEP 04/10/21   [name], NP    Allergies No known allergies  Family History  Problem Relation Age of Onset   Heart attack Mother        First MI @ 56 - Died @ 75   Heart disease Mother    Heart disease Father        Died @ 70   Throat cancer Brother    Liver cancer Brother    Colon cancer Sister     Social History Social History   Tobacco Use   Smoking status: Every Day    Packs/day: 1.00    Years: 45.00     Pack years: 45.00    Types: Cigarettes   Smokeless tobacco: Never   Tobacco comments:    Has cut back, trying to quit.   Vaping Use   Vaping Use: Never used  Substance Use Topics   Alcohol use: No    Alcohol/week: 0.0 standard drinks  Drug use: Yes    Types: Marijuana    Comment: last noc      Review of Systems Constitutional: No fever/chills Eyes: No visual changes. ENT: No sore throat. Cardiovascular: Denies chest pain. Respiratory: Denies shortness of breath. Gastrointestinal: No abdominal pain.  No nausea, no vomiting.  No diarrhea.  No constipation. Genitourinary: Negative for dysuria. Musculoskeletal: Shoulder pain Skin: Negative for rash. Neurological: Negative for headaches, focal weakness or numbness. All other ROS negative ____________________________________________   PHYSICAL EXAM:  VITAL SIGNS: ED Triage Vitals  Enc Vitals Group     BP 05/15/21 1501 121/67     Pulse Rate 05/15/21 1501 83     Resp 05/15/21 1501 17     Temp 05/15/21 1501 98.3 F (36.8 C)     Temp Source 05/15/21 1501 Oral     SpO2 05/15/21 1501 95 %     Weight 05/15/21 1357 150 lb (68 kg)     Height 05/15/21 1357 5\' 7"  (1.702 m)     Head Circumference --      Peak Flow --      Pain Score 05/15/21 1357 8     Pain Loc --      Pain Edu? --      Excl. in [place]? --     Constitutional: Alert and oriented. Well appearing and in no acute distress. Eyes: Conjunctivae are normal. EOMI. Head: Atraumatic. Nose: No congestion/rhinnorhea. Mouth/Throat: Mucous membranes are moist.   Neck: No stridor. Trachea Midline. FROM Cardiovascular: Normal rate, regular rhythm. Grossly normal heart sounds.  Good peripheral circulation. Respiratory: Normal respiratory effort.  No retractions. Lungs CTAB. Gastrointestinal: Soft and nontender. No distention. No abdominal bruits.  Musculoskeletal: Neurovascularly intact in the left hand.  No swelling noted.  Decreased range of motion secondary to pain but  able to get the arm partially raise and able to roll her shoulder backwards Neurologic:  Normal speech and language. No gross focal neurologic deficits are appreciated.  Skin:  Skin is warm, dry and intact. No rash noted. Psychiatric: Mood and affect are normal. Speech and behavior are normal. GU: Deferred   ___ RADIOLOGY   Official radiology report(s): DG Shoulder Left  Result Date: 05/15/2021 CLINICAL DATA:  Left shoulder pain History of adhesive capsulitis EXAM: LEFT SHOULDER - 2+ VIEW COMPARISON:  01/17/2019 FINDINGS: Advanced spurring of the glenohumeral joint. Irregularity of the lesser tuberosity suspicious for subscapularis tendinosis. No fracture or dislocation. IMPRESSION: Advanced left glenohumeral osteoarthrosis. Electronically Signed   By: Miachel Roux M.D.   On: 05/15/2021 15:04    ____________________________________________   PROCEDURES  Procedure(s) performed (including Critical Care):  Procedures   ____________________________________________   INITIAL IMPRESSION / ASSESSMENT AND PLAN / ED COURSE  JERELYN TRIMARCO was evaluated in Emergency Department on 05/15/2021 for the symptoms described in the history of present illness. She was evaluated in the context of the global COVID-19 pandemic, which necessitated consideration that the patient might be at risk for infection with the SARS-CoV-2 virus that causes COVID-19. Institutional protocols and algorithms that pertain to the evaluation of patients at risk for COVID-19 are in a state of rapid change based on information released by regulatory bodies including the CDC and federal and state organizations. These policies and algorithms were followed during the patient's care in the ED.    Patient comes in with left shoulder pain.  Denies any falls, history of dislocation.  X-ray ordered and is negative for dislocation or fracture.  No evidence of DVT.  Neurovascularly intact.  Suspect most likely ligamental issue.  Patient  denies any chest pain and this is not an equivalent of ACS given his been going on for a month and very much with movement  will give orthopedics follow-up.  Prior BMP shows elevated creatinine.  Instructed to avoid Advil and use Tylenol and the lidocaine patches only  I discussed the provisional nature of ED diagnosis, the treatment so far, the ongoing plan of care, follow up appointments and return precautions with the patient and any family or support people present. They expressed understanding and agreed with the plan, discharged home.          ____________________________________________   FINAL CLINICAL IMPRESSION(S) / ED DIAGNOSES   Final diagnoses:  Strain of left shoulder, initial encounter      MEDICATIONS GIVEN DURING THIS VISIT:  Medications - No data to display   ED Discharge Orders     None        Note:  This document was prepared using Dragon voice recognition software and may include unintentional dictation errors.    Vanessa [place], MD 05/15/21 [phone]

## 2021-05-20 ENCOUNTER — Other Ambulatory Visit: Payer: Medicare PPO

## 2021-05-20 DIAGNOSIS — E1151 Type 2 diabetes mellitus with diabetic peripheral angiopathy without gangrene: Secondary | ICD-10-CM

## 2021-05-20 DIAGNOSIS — I10 Essential (primary) hypertension: Secondary | ICD-10-CM | POA: Diagnosis not present

## 2021-05-21 ENCOUNTER — Ambulatory Visit: Payer: Medicare PPO | Admitting: Primary Care

## 2021-05-28 ENCOUNTER — Telehealth: Payer: Self-pay

## 2021-05-28 NOTE — Telephone Encounter (Signed)
Attempted to call patient. Phone just rang out. No VM available. Will attempt again at a later time.

## 2021-05-28 NOTE — Progress Notes (Addendum)
Chronic Care Management Pharmacy Assistant   Name: Sarah Payne  MRN: [id number] DOB: [birth year]  Reason for Encounter: CCM (Diabetes Disease State)   Recent office visits:  None since last CCM contact  Recent consult visits:  05/13/2021 - Radiology - Patient presented for low dose CT.   Hospital visits:  05/15/2021 [facility] ED - Patient presented for strain of left shoulder. Start:  lidocaine (LIDODERM) 5 % - Place 1 patch onto the skin every 12 hours for 5 days.   Medications: Outpatient Encounter Medications as of 05/28/2021  Medication Sig   albuterol (VENTOLIN HFA) 108 (90 Base) MCG/ACT inhaler Inhale 2 puffs into the lungs every 6 (six) hours as needed for wheezing or shortness of breath.   amitriptyline (ELAVIL) 50 MG tablet Take 50 mg by mouth at bedtime as needed for sleep.   carvedilol (COREG) 6.25 MG tablet Take 1 tablet (6.25 mg total) by mouth 2 (two) times daily.   clopidogrel (PLAVIX) 75 MG tablet Take 1 tablet (75 mg total) by mouth daily.   Continuous Blood Gluc Receiver (FREESTYLE LIBRE 2 READER) DEVI Use with sensor to check blood sugars 6 times daily.   Continuous Blood Gluc Sensor (FREESTYLE LIBRE 2 SENSOR) MISC Use to check blood sugars 6 times daily.   enalapril (VASOTEC) 20 MG tablet Take 1 tablet (20 mg total) by mouth daily.   ezetimibe (ZETIA) 10 MG tablet Take 1 tablet (10 mg total) by mouth daily. For cholesterol.   furosemide (LASIX) 20 MG tablet Take 1 tablet (20 mg total) by mouth daily as needed for edema.   gabapentin (NEURONTIN) 300 MG capsule Take 1 capsule (300 mg total) by mouth 3 (three) times daily.   glucose blood (FREESTYLE LITE) test strip Test up to four times a day as needed. DX   insulin aspart (NOVOLOG FLEXPEN) 100 UNIT/ML FlexPen Inject 22 units with breakfast and lunch, inject 14 units with dinner.   insulin glargine (LANTUS SOLOSTAR) 100 UNIT/ML Solostar Pen Inject 60 Units into the skin at bedtime.    Insulin Pen Needle (INSUPEN PEN NEEDLES) 32G X 4 MM MISC Use to inject insulin 4 times daily.   isosorbide mononitrate (IMDUR) 30 MG 24 hr tablet Take 1 tablet (30 mg) by mouth once daily at bedtime   levothyroxine (SYNTHROID) 100 MCG tablet Take 1 tablet by mouth on an empty stomach with water only on Sunday, Monday, and Tuesday. No food or other medications for 30 minutes.   levothyroxine (SYNTHROID) 88 MCG tablet Take 1 tablet by mouth every morning on an empty stomach with water only on Wednesday, Thursday, Friday, Saturday.  No food or other medications for 30 minutes.   meloxicam (MOBIC) 15 MG tablet Take 15 mg by mouth daily.   neomycin-polymyxin b-dexamethasone (MAXITROL) 3.5-10000-0.1 SUSP Place 1 drop into the left eye 3 (three) times daily.   potassium chloride (KLOR-CON) 10 MEQ tablet Only take on days you take your lasix. Take 1 tablet (10 mEq total) by mouth for 1 dose on those days you take your lasix 20mg  pill.   rosuvastatin (CRESTOR) 40 MG tablet Take 1 tablet (40 mg total) by mouth every evening. For cholesterol.   spironolactone (ALDACTONE) 25 MG tablet Take 1 tablet (25 mg total) by mouth daily.   traZODone (DESYREL) 100 MG tablet TAKE 1 TABLET BY MOUTH AT BEDTIME AS NEEDED FOR SLEEP   No facility-administered encounter medications on file as of 05/28/2021.    Recent Relevant Labs:  Lab Results  Component Value Date/Time   HGBA1C 8.4 (A) 02/15/2021 09:17 AM   HGBA1C 8.5 (A) 11/14/2020 11:14 AM   HGBA1C 10.3 (H) 12/24/2018 12:27 PM   HGBA1C 10.4 (H) 09/16/2016 11:53 AM   HGBA1C 10.9 (H) 08/02/2014 04:08 AM   HGBA1C 7.3 (H) 08/19/2013 12:19 PM   MICROALBUR 1.2 05/30/2013 04:48 PM    Kidney Function Lab Results  Component Value Date/Time   CREATININE 1.67 (H) 04/19/2021 09:59 AM   CREATININE 2.24 (H) 04/12/2021 12:58 PM   CREATININE 1.06 (H) 04/04/2016 05:21 PM   CREATININE 1.24 08/02/2014 04:08 AM   CREATININE 1.33 (H) 08/01/2014 12:53 PM   CREATININE 1.07 11/24/2011  03:41 PM   GFR 44.80 (L) 08/03/2020 10:05 AM   GFRNONAA 43 (L) 01/08/2021 11:55 AM   GFRNONAA 46 (L) 08/02/2014 04:08 AM   GFRNONAA 47 (L) 08/20/2013 05:07 AM   GFRNONAA 55 (L) 11/24/2011 03:41 PM   GFRAA 55 (L) 08/30/2020 09:41 AM   GFRAA 56 (L) 08/02/2014 04:08 AM   GFRAA 55 (L) 08/20/2013 05:07 AM   GFRAA 64 11/24/2011 03:41 PM   Contacted patient on 06/05/2021 to discuss diabetes disease state.   Current antihyperglycemic regimen:  Novolog flexpen - Inject 22 before breakfast and lunch and 14 before dinner Lantus solostar - Inject 60 units every evening (11 PM)   Patient verbally confirms she is taking the above medications as directed. Yes  What diet changes have been made to improve diabetes control? Patient eats what she wants, but doesn't eat much.   What recent interventions/DTPs have been made to improve glycemic control:  -Recommended to reduce Novolog before lunch to 15 units and further if continued hypoglycemia. Please keep dietary and insulin log. Avoid sodas.  Have there been any recent hospitalizations or ED visits since last visit with CPP? Yes  Patient denies hypoglycemic symptoms, including Pale, Sweaty, Shaky, Hungry, Nervous/irritable, and Vision changes  Patient denies hyperglycemic symptoms, including blurry vision, excessive thirst, fatigue, polyuria, and weakness  How often are you checking your blood sugar? once daily 11/12  148 before breakfast - 45 before lunch - 119 2 hrs. After lunch 11/11 - 159 before breakfast - 62 before lunch - 128 after lunch 11/10 - 157 before breakfast - 62 before lunch - 128 after lunch 11/09 - 167 before breakfast - 45 2 hours after lunch - 176 2 hours after dinner  During the week, how often does your blood glucose drop below 70?  Daily  Are you checking your feet daily/regularly? Yes  Reviewed the following: The 15-15 rule for low sugars: If sugar reading below 70, have 15 grams of carbohydrate to raise your blood  sugar and check it after 15 minutes. If it's still below 70 mg/dL, have another serving. 15 grams of carbs may be: -Glucose tablets (see instructions) -Gel tube (see instructions) -4 ounces (1/2 cup) of juice or regular soda (not diet) -1 tablespoon of sugar, honey, or corn syrup -Hard candies, jellybeans or gumdrops--see food label for how many to consume  Repeat these steps until your blood sugar is at least 70 mg/dL. Once your blood sugar is back to normal, eat a meal or snack to make sure it doesn't lower again.   Adherence Review: Is the patient currently on a STATIN medication? Yes Is the patient currently on ACE/ARB medication? Yes Does the patient have >5 day gap between last estimated fill dates? No  Care Gaps: Annual wellness visit in last year? Yes Most recent  A1C reading: 8.4 on 02/15/2021 Most Recent BP reading: 121/67 on 05/15/2021  Last eye exam / retinopathy screening: Up to date Last diabetic foot exam: 02/01/2020  Counseled patient on importance of annual eye and foot exam.   Star Rating Drugs:  Medication:  Last Fill: Day Supply Rosuvastatin 40 mg 04/16/2021 90 Enalapril 20 mg 04/16/2021      90                                 Lantus 100 Units 05/01/2021      75 Novolog 100 Units 05/01/2021 77 Verified fill dates with Meds by [facility]   Cardiology appointment on 06/12/2021  [name], CPP notified  [name], [place] Assistant [phone]  See telephone note from [place]. I have reviewed the care management and care coordination activities outlined in this encounter and I am certifying that I agree with the content of this note. No further action required.  [name], PharmD Clinical Pharmacist [facility] at [facility] [phone]

## 2021-05-28 NOTE — Telephone Encounter (Signed)
-----   Message from Theora Gianotti, NP sent at 05/28/2021 10:33 AM EST ----- Overall reassuring monitor showing normal heart rhythm with 3 brief episodes of fast heart rhythm stemming from the top chambers of the heart, the longest of which only lasted 7 beats.  She had rare extra heartbeats from the top and bottom chambers.  Triggered events were rare but were associated with extra beats.  No significant arrhythmias to explain passing out spells.

## 2021-05-29 NOTE — Telephone Encounter (Signed)
Called patient and gave her the result note as documented below. Patient stated that she is still not feeling great, she is having multiple hot flashed a day. Denies having a fever, just gets really hot and has to go outside. She requested to be seen sooner than her scheduled follow up on 06/12/21 with Dr. Rockey Situ. There is no available appointments with any APP or Dr. Rockey Situ available prior to appointment, will forward to scheduling to put patient on a waitlist and forward to Dr. Donivan Scull nurse in case of any cancellations.

## 2021-05-31 DIAGNOSIS — M19012 Primary osteoarthritis, left shoulder: Secondary | ICD-10-CM | POA: Diagnosis not present

## 2021-06-11 ENCOUNTER — Telehealth: Payer: Self-pay

## 2021-06-11 NOTE — Telephone Encounter (Signed)
Attempted to reach patient to discuss hypoglycemia, no answer. Please have patient return call at [phone].  [name], PharmD Clinical Pharmacist [facility] at [facility] [phone]

## 2021-06-11 NOTE — Telephone Encounter (Addendum)
Called and spoke to patient about reported hypoglycemia. Pt states she has not had low BG the past 2 days. BG has been high today since lunch (250s). Reports she has not eaten anything different today (skips breakfast and eats sandwich with fries or chips for lunch). She did not take her Lantus last night due to concern about low BG overnight (her BG was 130 at bedtime).   She reports taking an additional correction dose of Novolog 22 units 1 hour ago due to BG still high (250) 2 hours after lunch. States she has been taking Novolog 22 units TID. We discussed differences between basal-bolus insulin. Importance of keeping insulin dose steady so we can determine cause of lows/highs. She agrees to keep records of insulin dosing and diet for next 2 weeks. She uses freestyle libre. She also reports ongoing "drunk" feeling. Sees cardiology tomorrow. Reports so far cardiology has not identified a underlying cause for her syncope. She states her BP was 100/56 Sunday. Reminded patient to follow up with PCP for diabetes visit. Pt states she is waiting for office to be closer.  [name], PharmD Clinical Pharmacist [facility] at [facility] [phone]

## 2021-06-11 NOTE — Telephone Encounter (Signed)
Multiple attempts have been made to reach patient to discuss low BG readings. No VM available.  [name], PharmD Clinical Pharmacist [facility] at [facility] [phone]

## 2021-06-12 ENCOUNTER — Other Ambulatory Visit: Payer: Self-pay

## 2021-06-12 ENCOUNTER — Other Ambulatory Visit: Payer: Self-pay | Admitting: Cardiovascular Disease

## 2021-06-12 ENCOUNTER — Encounter: Payer: Self-pay | Admitting: Cardiovascular Disease

## 2021-06-12 ENCOUNTER — Ambulatory Visit (INDEPENDENT_AMBULATORY_CARE_PROVIDER_SITE_OTHER): Payer: Medicare PPO | Admitting: Cardiovascular Disease

## 2021-06-12 VITALS — BP 97/56 | HR 73 | Ht 67.0 in | Wt 150.0 lb

## 2021-06-12 DIAGNOSIS — E1169 Type 2 diabetes mellitus with other specified complication: Secondary | ICD-10-CM | POA: Diagnosis not present

## 2021-06-12 DIAGNOSIS — E782 Mixed hyperlipidemia: Secondary | ICD-10-CM

## 2021-06-12 DIAGNOSIS — I10 Essential (primary) hypertension: Secondary | ICD-10-CM | POA: Diagnosis not present

## 2021-06-12 DIAGNOSIS — I25118 Atherosclerotic heart disease of native coronary artery with other forms of angina pectoris: Secondary | ICD-10-CM | POA: Diagnosis not present

## 2021-06-12 DIAGNOSIS — I5032 Chronic diastolic (congestive) heart failure: Secondary | ICD-10-CM

## 2021-06-12 MED ORDER — FUROSEMIDE 20 MG PO TABS: ORAL_TABLET | ORAL | 3 refills | Status: DC

## 2021-06-12 MED ORDER — CARVEDILOL 3.125 MG PO TABS: 3.1250 mg | ORAL_TABLET | Freq: Two times a day (BID) | ORAL | 3 refills | Status: DC

## 2021-06-12 MED ORDER — ENALAPRIL MALEATE 10 MG PO TABS: 10.0000 mg | ORAL_TABLET | Freq: Every day | ORAL | 3 refills | Status: DC

## 2021-06-12 NOTE — Progress Notes (Signed)
Cardiology Office Note  Date:  06/12/2021   ID:  Licia, Harl 03/20/1946, MRN [id number]  PCP:  [name], NP   Chief Complaint  Patient presents with   2 month follow up     Patient c/o dizziness, syncope, weakness, no energy and has shortness of breath with little to no exertion for the past two months. Medications reviewed by the patient verbally.     HPI:  75 year old woman with a long history of  smoking,  coronary artery disease,  prior stent x3 placed to the mid left circumflex with moderate to severe LAD disease ,  stent placed to her distal RCA in February 2013,  stent to the mid left circumflex in March 2013 ,  poorly controlled diabetes with hemoglobin A1c of 10,  depression,  hypertension,  cardiac catheterization 08/19/2012 showing moderate mid LAD disease at the takeoff of the diagonal vessel, severe ostial to mid RCA disease, ejection fraction greater than 55%. She had FFR pressure wire showing severe disease of the RCA with drug-eluting stent x2 placed. C. difficile late 2015 after antibiotics Previous history of running out of her medications cardiac catheterization performed showing patent stents 12/2015 She presents today for follow-up of her coronary artery disease,    Last seen in clinic by myself December 2021 Recent events reviewed lower extremity angiography as detailed below with Dr. Fletcher Anon February 2022 1.  No significant aortoiliac disease. 2.  Left lower extremity: Severe calcified disease in the distal SFA with two-vessel runoff below the knee with an occluded anterior tibial artery.  The posterior tibial artery has moderate diffuse disease in the mid to distal segment. 3.  Successful orbital atherectomy and drug-coated balloon angioplasty to the left SFA.  Very dizzy near syncope for 2 months Low pressures Staying hydrated, Weight stable orthostasis  Sugars dropping, "45" Taking lasix daily, no SOB, no edema  Echo 03/2021:   Normal EF, no pulm htn  Recent thyroid issues,sugars high, hyperthyroidism Dose of thyroid medication adjusted per the patient Now feeling better  Seen several months ago, started on Lasix daily by our group Pumping renal function, cut back to Lasix every other day Still having constipation, dry mouth Denies shortness of breath, leg swelling, abdominal distention  HBA1C 10.3 three months ago, Feels like the numbers will be better on the next check  On crestor, zetia daily Total chol <100  Still smoking, 1 ppd  Left eye tearing "like crazy"  Also with significant left leg weakness with walking, calf gets weak, almost falls down Sx started 2-3 months ago, getting worse  EKG personally reviewed by myself on todays visit Normal sinus rhythm rate 73 bpm, left bundle branch block  Other past medical history reviewed Echo 10/21 reviewed  1. Left ventricular ejection fraction, by estimation, is 50 to 55%. The  left ventricle has low normal function. The left ventricle has no regional  wall motion abnormalities. There is mild left ventricular hypertrophy.  Left ventricular diastolic  parameters are consistent with Grade II diastolic dysfunction  (pseudonormalization).   2. Right ventricular systolic function is normal. The right ventricular  size is normal. Mildly increased right ventricular wall thickness.   3. The mitral valve is normal in structure. No evidence of mitral valve  regurgitation. No evidence of mitral stenosis.   4. The aortic valve is grossly normal. Aortic valve regurgitation is not  visualized. Mild aortic valve sclerosis is present, with no evidence of  aortic valve stenosis.   5.  The inferior vena cava is normal in size with greater than 50%  respiratory variability, suggesting right atrial pressure of 3 mmHg.    Chronic severe back pain, Seen by neurosurgery.  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 as ejection fraction was depressed Echocardiogram June 2017, ejection fraction 25-30% She had follow-up echocardiogram   September 2017 showing normal LV functio   PMH:   has a past medical history of Acute systolic (congestive) heart failure ([place]) (01/01/2016), Cataract, Chronic low back pain, COPD (chronic obstructive pulmonary disease) ([place]), Coronary artery disease, Depression, Diabetes mellitus, Diverticulosis, Frequent PVCs, GERD (gastroesophageal reflux disease), Hyperlipidemia, Hypertension, Hypothyroidism, Impingement syndrome of left shoulder, Marijuana abuse, Myocardial infarction [facility]), Sleep apnea, Takotsubo cardiomyopathy, Tendonitis of left rotator cuff, Tobacco abuse, and Vascular dementia.  PSH:    Past Surgical History:  Procedure Laterality Date   ABDOMINAL AORTOGRAM W/LOWER EXTREMITY N/A 09/12/2020   Procedure: ABDOMINAL AORTOGRAM W/LOWER EXTREMITY;  Surgeon: Wellington Hampshire, MD;  Location: [facility];  Service: Cardiovascular;  Laterality: N/A;   ABDOMINAL HYSTERECTOMY     APPENDECTOMY     BACK SURGERY     CARDIAC CATHETERIZATION  2013   CARDIAC CATHETERIZATION  2016   CARDIAC CATHETERIZATION N/A 01/01/2016   Procedure: Left Heart Cath and Coronary Angiography;  Surgeon: Jettie Booze, MD;  Location: [facility];  Service: Cardiovascular;  Laterality: N/A;   CATARACT EXTRACTION W/PHACO Left 01/30/2015   Procedure: CATARACT EXTRACTION PHACO AND INTRAOCULAR LENS PLACEMENT (IOC);  Surgeon: Birder Robson, MD;  Location: [facility];  Service: Ophthalmology;  Laterality: Left;  [place] 00:47    CATARACT EXTRACTION W/PHACO Right 02/13/2015   Procedure: CATARACT  EXTRACTION PHACO AND INTRAOCULAR LENS PLACEMENT (IOC);  Surgeon: Birder Robson, MD;  Location: [facility];  Service: Ophthalmology;  Laterality: Right;  cassette lot # [id number] H [place]  00:29.9 AP  20.7 CDE  6.20   COLONOSCOPY N/A 11/02/2014   Procedure: COLONOSCOPY;  Surgeon: Inda Castle, MD;  Location: [facility];  Service: Endoscopy;  Laterality: N/A;   CORONARY ANGIOPLASTY  2012   stent x 3    CORONARY ANGIOPLASTY WITH STENT PLACEMENT  2013   ESOPHAGOGASTRODUODENOSCOPY (EGD) WITH PROPOFOL N/A 09/21/2018   Procedure: ESOPHAGOGASTRODUODENOSCOPY (EGD) WITH PROPOFOL;  Surgeon: [name], MD;  Location: [facility];  Service: Gastroenterology;  Laterality: N/A;   PERIPHERAL VASCULAR ATHERECTOMY Left 09/12/2020   Procedure: PERIPHERAL VASCULAR ATHERECTOMY;  Surgeon: Wellington Hampshire, MD;  Location: [facility];  Service: Cardiovascular;  Laterality: Left;   PERIPHERAL VASCULAR BALLOON ANGIOPLASTY  09/12/2020   Procedure: PERIPHERAL VASCULAR BALLOON ANGIOPLASTY;  Surgeon: Wellington Hampshire, MD;  Location: [facility];  Service: Cardiovascular;;   SHOULDER SURGERY Left 2017    Current Outpatient Medications  Medication Sig Dispense Refill   albuterol (VENTOLIN HFA) 108 (90 Base) MCG/ACT inhaler Inhale 2 puffs into the lungs every 6 (six) hours as needed for wheezing or shortness of breath. 8 g 0   amitriptyline (ELAVIL) 50 MG tablet Take 50 mg by mouth at bedtime as needed for sleep.     carvedilol (COREG) 6.25 MG tablet Take 1 tablet (6.25 mg  total) by mouth 2 (two) times daily. 180 tablet 3   clopidogrel (PLAVIX) 75 MG tablet Take 1 tablet (75 mg total) by mouth daily. 90 tablet 3   Continuous Blood Gluc Receiver (FREESTYLE LIBRE 2 READER) DEVI Use with sensor to check blood sugars 6 times daily. 1 each 0   Continuous Blood Gluc Sensor (FREESTYLE LIBRE 2 SENSOR) MISC Use to check blood sugars 6 times daily. 3 each 3   enalapril (VASOTEC) 20 MG tablet Take 1 tablet (20 mg total)  by mouth daily. 90 tablet 3   ezetimibe (ZETIA) 10 MG tablet Take 1 tablet (10 mg total) by mouth daily. For cholesterol. 90 tablet 3   furosemide (LASIX) 20 MG tablet Take 1 tablet (20 mg total) by mouth daily as needed for edema. 90 tablet 3   gabapentin (NEURONTIN) 300 MG capsule Take 1 capsule (300 mg total) by mouth 3 (three) times daily. 90 capsule 5   glucose blood (FREESTYLE LITE) test strip Test up to four times a day as needed. DX 100 each 0   insulin aspart (NOVOLOG FLEXPEN) 100 UNIT/ML FlexPen Inject 22 units with breakfast and lunch, inject 14 units with dinner. 45 mL 3   insulin glargine (LANTUS SOLOSTAR) 100 UNIT/ML Solostar Pen Inject 60 Units into the skin at bedtime. 45 mL 3   Insulin Pen Needle (INSUPEN PEN NEEDLES) 32G X 4 MM MISC Use to inject insulin 4 times daily. 250 each 1   isosorbide mononitrate (IMDUR) 30 MG 24 hr tablet Take 1 tablet (30 mg) by mouth once daily at bedtime 90 tablet 0   levothyroxine (SYNTHROID) 100 MCG tablet Take 1 tablet by mouth on an empty stomach with water only on Sunday, Monday, and Tuesday. No food or other medications for 30 minutes. 36 tablet 3   levothyroxine (SYNTHROID) 88 MCG tablet Take 1 tablet by mouth every morning on an empty stomach with water only on Wednesday, Thursday, Friday, Saturday.  No food or other medications for 30 minutes. 48 tablet 3   meloxicam (MOBIC) 15 MG tablet Take 15 mg by mouth daily.     neomycin-polymyxin b-dexamethasone (MAXITROL) 3.5-10000-0.1 SUSP Place 1 drop into the left eye 3 (three) times daily.     potassium chloride (KLOR-CON) 10 MEQ tablet Only take on days you take your lasix. Take 1 tablet (10 mEq total) by mouth for 1 dose on those days you take your lasix 20mg  pill. 90 tablet 3   rosuvastatin (CRESTOR) 40 MG tablet Take 1 tablet (40 mg total) by mouth every evening. For cholesterol. 90 tablet 3   spironolactone (ALDACTONE) 25 MG tablet Take 1 tablet (25 mg total) by mouth daily. 90 tablet 3    traZODone (DESYREL) 100 MG tablet TAKE 1 TABLET BY MOUTH AT BEDTIME AS NEEDED FOR SLEEP 90 tablet 2   No current facility-administered medications for this visit.     Allergies:   No known allergies   Social History:  The patient  reports that she has been smoking cigarettes. She has a 45.00 pack-year smoking history. She has never used smokeless tobacco. She reports current drug use. Drug: Marijuana. She reports that she does not drink alcohol.   Family History:   family history includes Colon cancer in her sister; Heart attack in her mother; Heart disease in her father and mother; Liver cancer in her brother; Throat cancer in her brother.    Review of Systems: Review of Systems  Constitutional: Negative.   HENT: Negative.  Respiratory: Negative.    Cardiovascular: Negative.   Gastrointestinal: Negative.   Musculoskeletal: Negative.   Neurological: Negative.   Psychiatric/Behavioral: Negative.    All other systems reviewed and are negative.   PHYSICAL EXAM: VS:  BP (!) 97/56 (BP Location: Left Arm, Patient Position: Sitting, Cuff Size: Normal)   Pulse 73   Ht 5\' 7"  (1.702 m)   Wt 150 lb (68 kg)   SpO2 96%   BMI 23.49 kg/m  , BMI Body mass index is 23.49 kg/m. Constitutional:  oriented to person, place, and time. No distress.  HENT:  Head: Grossly normal Eyes:  no discharge. No scleral icterus.  Neck: No JVD, no carotid bruits  Cardiovascular: Regular rate and rhythm, no murmurs appreciated Pulmonary/Chest: Clear to auscultation bilaterally, no wheezes or rails Abdominal: Soft.  no distension.  no tenderness.  Musculoskeletal: Normal range of motion Neurological:  normal muscle tone. Coordination normal. No atrophy Skin: Skin warm and dry Psychiatric: normal affect, pleasant  Recent Labs: 08/03/2020: ALT 22 01/04/2021: TSH 1.140 04/19/2021: BUN 29; Creatinine, Ser 1.67; Hemoglobin 13.6; Platelets 320; Potassium 4.9; Sodium 139    Lipid Panel Lab Results   Component Value Date   CHOL 102 08/03/2020   HDL 38.70 (L) 08/03/2020   LDLCALC 29 08/03/2020   TRIG 171.0 (H) 08/03/2020     Wt Readings from Last 3 Encounters:  06/12/21 150 lb (68 kg)  05/15/21 150 lb (68 kg)  05/13/21 150 lb (68 kg)     ASSESSMENT AND PLAN:  CAD, multiple vessel -  Currently with no symptoms of angina. No further workup at this time. Continue current medication regimen. Long discussion concerning smoking cessation, better diabetes control A1c chronically elevated  Mixed hyperlipidemia -  Stay on Crestor with Zetia, goal LDL less than 70  Diabetes mellitus type 2 with peripheral artery disease ([place]) -  A1c typically running 9 up to 10 Reports she is watching her diet closely In the past has worked with endocrine  Chronic systolic congestive heart failure ([place]) -  on ACE inhibitor, Imdur, carvedilol  Near syncope spells, low blood pressure, changes made as below Blood pressure today 97/56, orthostatics positive down to 77/56 with sitting heart rate 49 Heart rate remained in the high 40s 50 even with standing  Essential hypertension -  Very orthostatic, having near syncope spells Recommend changes as below:  REDUCE Lasix 20 mg to 3 days a week Monday, Wednesday, Friday Do not take anymore this week Start next week (Monday 11/28) Enalapril  Reduce to 10 mg daily  no enalpril on tomorrow (Thursday),  Start 10 mg Friday 11/25 Coreg  Reduce to 3.125 twice a day  None tonight Start 11/24   Tobacco abuse - We have encouraged her to continue to work on weaning her cigarettes and smoking cessation. She will continue to work on this and does not want any assistance with chantix.    COPD, moderate ([place]) - Chronic cough, smoking cessation recommended  PAD February 2022 intervention lower extremities with Dr. Fletcher Anon Denies claudication symptoms    Total encounter time more than 35 minutes  Greater than 50% was spent in counseling and coordination  of care with the patient   Orders Placed This Encounter  Procedures   EKG 12-Lead      Signed, Esmond Plants, M.D., Ph.D. 06/12/2021  [place], [place]

## 2021-06-12 NOTE — Telephone Encounter (Signed)
Please address pharmacy note.

## 2021-06-12 NOTE — Patient Instructions (Addendum)
Medication Instructions:   Please REDUCE Lasix 20 mg to 3 days a week Monday, Wednesday, Friday Do not take anymore this week Start next week (Monday 11/28)  Enalapril  Reduce to 10 mg daily  no enalpril on tomorrow (Thursday),  Start 10 mg Friday 11/25  Coreg  Reduce to 3.125 twice a day  None tonight Start 11/24   If you need a refill on your cardiac medications before your next appointment, please call your pharmacy.    Lab work: No new labs needed  Testing/Procedures: No new testing needed  Follow-Up: At [facility], you and your health needs are our priority.  As part of our continuing mission to provide you with exceptional heart care, we have created designated Provider Care Teams.  These Care Teams include your primary Cardiologist (physician) and Advanced Practice Providers (APPs -  Physician Assistants and Nurse Practitioners) who all work together to provide you with the care you need, when you need it.  You will need a follow up appointment in 1 month  Providers on your designated Care Team:   Murray Hodgkins, NP Christell Faith, PA-C Cadence Kathlen Mody, [place]   COVID-19 Vaccine Information can be found at: [email or web address].uk For questions related to vaccine distribution or appointments, please email vaccine@[email or web address] .[email or web address] or call 8182096870.   Please monitor blood pressures and keep a log of your readings.  Call or upload BP and HR log to MyChart in a week  How to use a home blood pressure monitor. Be still. Measure at the same time every day. It's important to take the readings at the same time each day, such as morning and evening. Take reading approximately 1 1/2 to 2 hours after BP medications.   AVOID these things for 30 minutes before checking your blood pressure: Drinking caffeine. Drinking alcohol. Eating. Smoking. Exercising.

## 2021-06-19 ENCOUNTER — Telehealth: Payer: Self-pay | Admitting: Cardiovascular Disease

## 2021-06-19 NOTE — Telephone Encounter (Signed)
Pt c/o BP issue: STAT if pt c/o blurred vision, one-sided weakness or slurred speech  1. What are your last 5 BP readings? 132/88, 120/97, 152/90, 128/80, 144/85, 85/65  2. Are you having any other symptoms (ex. Dizziness, headache, blurred vision, passed out)? Light headed, nausea, saw white cloud   3. What is your BP issue? Concerned of BP being low today

## 2021-06-19 NOTE — Telephone Encounter (Signed)
Was able to return call to Sarah Payne, she reports after her last OV with Dr. Rockey Situ last week 11/23 she got "really sick", her BP dropped (low at appt as well), seen "white clouds", and got really nauseated with vomiting. After her appt she has been following new med instructions as followed  Please REDUCE Lasix 20 mg to 3 days a week Monday, Wednesday, Friday Do not take anymore this week Start next week (Monday 11/28)   Enalapril  Reduce to 10 mg daily  no enalpril on tomorrow (Thursday),  Start 10 mg Friday 11/25   Coreg  Reduce to 3.125 twice a day  None tonight Start 11/24   Has been doing well and feeling better. Pt reports her daughter cleaned out her pill box the following day on 11/24 and noticed she was "over medicating herself", taking too many pills causing her BP to drop.   This morning she reports her BP was 85/65 HR 64, Has not taken her morning meds today BP yesterday was 144/85 HR 85.  Had pt check her BP while on the phone, currently 180/109 HR 79 having "head numbness" to posterior side. Advised to go ahead and take her morning meds. Coreg 3.125 mg Lasix 20 mg, and Enalapril 10 mg. Recheck BP in 1 1/2-2 hours to see where she at.  If still elevated, having head numbness still, or any other symptoms start then seek ED for stroke r/o. If her BP is low, add salt to diet and hydrate to bring up BP, if still low at evening, then do not take 2nd coreg or IMDUR. Advised if no improvement then to call back with update and further recommendations.  Sarah Payne thankful for the return call and recommendations, will call back if no changes or not getting better.

## 2021-06-21 ENCOUNTER — Telehealth: Payer: Self-pay

## 2021-06-21 NOTE — Progress Notes (Signed)
Chronic Care Management Pharmacy Assistant   Name: Sarah Payne  MRN: [id number] DOB: 1945/09/03  Reason for Encounter: CCM (Appointment Reminder)   Medications: Outpatient Encounter Medications as of 06/21/2021  Medication Sig   albuterol (VENTOLIN HFA) 108 (90 Base) MCG/ACT inhaler Inhale 2 puffs into the lungs every 6 (six) hours as needed for wheezing or shortness of breath.   amitriptyline (ELAVIL) 50 MG tablet Take 50 mg by mouth at bedtime as needed for sleep.   carvedilol (COREG) 3.125 MG tablet Take 1 tablet (3.125 mg total) by mouth 2 (two) times daily.   clopidogrel (PLAVIX) 75 MG tablet Take 1 tablet (75 mg total) by mouth daily.   Continuous Blood Gluc Receiver (FREESTYLE LIBRE 2 READER) DEVI Use with sensor to check blood sugars 6 times daily.   Continuous Blood Gluc Sensor (FREESTYLE LIBRE 2 SENSOR) MISC Use to check blood sugars 6 times daily.   enalapril (VASOTEC) 10 MG tablet TAKE 1 TABLET BY MOUTH ONCE DAILY   ezetimibe (ZETIA) 10 MG tablet Take 1 tablet (10 mg total) by mouth daily. For cholesterol.   furosemide (LASIX) 20 MG tablet Take one tab only on Monday, Wednesday, & Fridays   gabapentin (NEURONTIN) 300 MG capsule Take 1 capsule (300 mg total) by mouth 3 (three) times daily.   glucose blood (FREESTYLE LITE) test strip Test up to four times a day as needed. DX   insulin aspart (NOVOLOG FLEXPEN) 100 UNIT/ML FlexPen Inject 22 units with breakfast and lunch, inject 14 units with dinner.   insulin glargine (LANTUS SOLOSTAR) 100 UNIT/ML Solostar Pen Inject 60 Units into the skin at bedtime.   Insulin Pen Needle (INSUPEN PEN NEEDLES) 32G X 4 MM MISC Use to inject insulin 4 times daily.   isosorbide mononitrate (IMDUR) 30 MG 24 hr tablet Take 1 tablet (30 mg) by mouth once daily at bedtime   levothyroxine (SYNTHROID) 100 MCG tablet Take 1 tablet by mouth on an empty stomach with water only on Sunday, Monday, and Tuesday. No food or other medications for 30 minutes.    levothyroxine (SYNTHROID) 88 MCG tablet Take 1 tablet by mouth every morning on an empty stomach with water only on Wednesday, Thursday, Friday, Saturday.  No food or other medications for 30 minutes.   meloxicam (MOBIC) 15 MG tablet Take 15 mg by mouth daily.   neomycin-polymyxin b-dexamethasone (MAXITROL) 3.5-10000-0.1 SUSP Place 1 drop into the left eye 3 (three) times daily.   potassium chloride (KLOR-CON) 10 MEQ tablet Only take on days you take your lasix. Take 1 tablet (10 mEq total) by mouth for 1 dose on those days you take your lasix 20mg pill.   rosuvastatin (CRESTOR) 40 MG tablet Take 1 tablet (40 mg total) by mouth every evening. For cholesterol.   spironolactone (ALDACTONE) 25 MG tablet Take 1 tablet (25 mg total) by mouth daily.   traZODone (DESYREL) 100 MG tablet TAKE 1 TABLET BY MOUTH AT BEDTIME AS NEEDED FOR SLEEP   No facility-administered encounter medications on file as of 06/21/2021.   Left a message for Richard K Durall to remind her of her upcoming telephone visit with Michelle Adams on 06/25/2021 at 4:00 pm. Patient was reminded to have all medications, supplements and any blood glucose and blood pressure readings available for review at appointment.  Star Rating Drugs: Medication:  Last Fill: Day Supply Rosuvastatin 40 mg    04/16/2021      90  Enalapril 20 mg  04/16/2021      90                                 Lantus 100 Units         05/01/2021      75 Novolog 100 Units      05/01/2021      77  [name], CPP notified  [name], [place] Clinical Pharmacy Assistant [phone]

## 2021-06-25 ENCOUNTER — Ambulatory Visit: Payer: Medicare PPO

## 2021-06-25 ENCOUNTER — Other Ambulatory Visit: Payer: Self-pay

## 2021-06-25 DIAGNOSIS — I10 Essential (primary) hypertension: Secondary | ICD-10-CM

## 2021-06-25 DIAGNOSIS — E1151 Type 2 diabetes mellitus with diabetic peripheral angiopathy without gangrene: Secondary | ICD-10-CM

## 2021-06-25 NOTE — Progress Notes (Signed)
Chronic Care Management Pharmacy Note  06/25/2021 Name:  Sarah Payne MRN:  [id number] DOB:  1945-08-10  Summary: Contacted patient to review detailed history of past 2 weeks for diabetes. She has not felt well. She did not record dietary log or insulin dosing log. She continues to use [employer] for BG monitoring. Her BG have improved some. She is not having as frequent lows and no readings > 200. It is difficult to assess as she is taking a range of 15-22 units Novolog based on her BG readings (self-titrated) before meals and her schedule is variable. I asked her to write down a detailed log of insulin dosing until follow up with PCP.   Recommendations:  - I discussed importance of follow up with PCP routinely with PCP for diabetes management.   Plan:  -Patient will call tomorrow to set up appt with PCP.   Subjective: Sarah Payne is an 75 y.o. year old female who is a primary patient of [name], NP.  The CCM team was consulted for assistance with disease management and care coordination needs.    Engaged with patient by telephone for follow up visit in response to provider referral for pharmacy case management and/or care coordination services.   Consent to Services:  The patient was given information about Chronic Care Management services, agreed to services, and gave verbal consent prior to initiation of services.  Please see initial visit note for detailed documentation.   Patient Care Team: [name], NP as PCP - General (Internal Medicine) Rockey Situ Kathlene November, MD as PCP - Cardiology (Cardiology) Minna Merritts, MD (Cardiology) Inda Castle, MD (Inactive) as Consulting Physician (Gastroenterology) Meade Maw, MD as Consulting Physician (Neurosurgery) Philemon Kingdom, MD as Consulting Physician (Internal Medicine) Minna Merritts, MD as Consulting Physician (Cardiology) [name], [facility] as Pharmacist (Pharmacist) Birder Robson, MD  as Referring Physician (Ophthalmology)  Recent office visits: None since last CCM contact   Recent consult visits:  06/12/21 - Cardiology - Reduce Lasix to 20 mg 3 days/week. Do not resume until 11/28. Reduce enalapril to 10 mg daily. Reduce Coreg to 3.125 mg BID.   Hospital visits: None in previous 6 months  Objective:  Lab Results  Component Value Date   CREATININE 1.67 (H) 04/19/2021   BUN 29 (H) 04/19/2021   GFR 44.80 (L) 08/03/2020   GFRNONAA 43 (L) 01/08/2021   GFRAA 55 (L) 08/30/2020   NA 139 04/19/2021   K 4.9 04/19/2021   CALCIUM 9.5 04/19/2021   CO2 21 04/19/2021    Lab Results  Component Value Date/Time   HGBA1C 8.4 (A) 02/15/2021 09:17 AM   HGBA1C 8.5 (A) 11/14/2020 11:14 AM   HGBA1C 10.3 (H) 12/24/2018 12:27 PM   HGBA1C 10.4 (H) 09/16/2016 11:53 AM   HGBA1C 10.9 (H) 08/02/2014 04:08 AM   HGBA1C 7.3 (H) 08/19/2013 12:19 PM   GFR 44.80 (L) 08/03/2020 10:05 AM   GFR 42.32 (L) 06/04/2020 09:29 AM   MICROALBUR 1.2 05/30/2013 04:48 PM    Last diabetic Eye exam:  Lab Results  Component Value Date/Time   HMDIABEYEEXA No Retinopathy 10/10/2020 12:00 AM    Last diabetic Foot exam:  02/01/20 with PCP   Lab Results  Component Value Date   CHOL 102 08/03/2020   HDL 38.70 (L) 08/03/2020   LDLCALC 29 08/03/2020   LDLDIRECT 143.0 12/24/2018   TRIG 171.0 (H) 08/03/2020   CHOLHDL 3 08/03/2020    Hepatic Function Latest Ref Rng &  Units 08/03/2020 08/29/2019 12/24/2018  Total Protein 6.0 - 8.3 g/dL 6.3 6.3 7.1  Albumin 3.5 - 5.2 g/dL 4.2 4.1 4.3  AST 0 - 37 U/L _0 ALT 0 - 35 U/L _1 Alk Phosphatase 39 - 117 U/L 68 91 80  Total Bilirubin 0.2 - 1.2 mg/dL 0.5 0.4 0.4  Bilirubin, Direct 0.0 - 0.3 mg/dL - - -    Lab Results  Component Value Date/Time   TSH 1.140 01/04/2021 12:11 PM   TSH 0.21 (L) 11/14/2020 11:35 AM   FREET4 1.34 01/04/2021 12:11 PM   FREET4 0.95 01/18/2018 10:40 AM    CBC Latest Ref Rng & Units 04/19/2021 04/12/2021 08/30/2020  WBC  3.4 - 10.8 x10E3/uL 9.4 9.7 8.4  Hemoglobin 11.1 - 15.9 g/dL 13.6 13.6 13.8  Hematocrit 34.0 - 46.6 % 39.6 40.0 41.5  Platelets 150 - 450 x10E3/uL 320 380 242    Lab Results  Component Value Date/Time   VD25OH 41 02/25/2013 11:37 AM    Clinical ASCVD: Yes  The ASCVD Risk score (Arnett DK, et al., 2019) failed to calculate for the following reasons:   The valid total cholesterol range is 130 to 320 mg/dL    Depression screen [facility] 2/9 02/06/2021 04/02/2020 05/12/2018  Decreased Interest 0 3 0  Down, Depressed, Hopeless 0 3 0  PHQ - 2 Score 0 6 0  Altered sleeping 0 3 -  Tired, decreased energy 0 3 -  Change in appetite 0 0 -  Feeling bad or failure about yourself  0 0 -  Trouble concentrating 0 0 -  Moving slowly or fidgety/restless 0 0 -  Suicidal thoughts 0 0 -  PHQ-9 Score 0 12 -  Difficult doing work/chores Not difficult at all Extremely dIfficult -  Some recent data might be hidden    Social History   Tobacco Use  Smoking Status Every Day   Packs/day: 1.00   Years: 45.00   Pack years: 45.00   Types: Cigarettes  Smokeless Tobacco Never  Tobacco Comments   Has cut back, trying to quit.    BP Readings from Last 3 Encounters:  06/12/21 (!) 97/56  05/15/21 121/67  04/12/21 (!) 141/82   Pulse Readings from Last 3 Encounters:  06/12/21 73  05/15/21 83  04/12/21 69   Wt Readings from Last 3 Encounters:  06/12/21 150 lb (68 kg)  05/15/21 150 lb (68 kg)  05/13/21 150 lb (68 kg)    Assessment/Interventions: Review of patient past medical history, allergies, medications, health status, including review of consultants reports, laboratory and other test data, was performed as part of comprehensive evaluation and provision of chronic care management services.   SDOH:  (Social Determinants of Health) assessments and interventions performed: Yes    CCM Care Plan  Allergies  Allergen Reactions   No Known Allergies     Medications Reviewed Today     Reviewed by  Anselm Pancoast, CMA (Certified Medical Assistant) on 06/12/21 at [phone]  Med List Status: <None>   Medication Order Taking? Sig Documenting Provider Last Dose Status Informant  albuterol (VENTOLIN HFA) 108 (90 Base) MCG/ACT inhaler [id number] Yes Inhale 2 puffs into the lungs every 6 (six) hours as needed for wheezing or shortness of breath. Minna Merritts, MD Taking Active Self  amitriptyline (ELAVIL) 50 MG tablet [id number] Yes Take 50 mg by mouth at bedtime as needed for sleep. [provider] Taking Active   carvedilol (COREG) 6.25 MG tablet  [id number] Yes Take 1 tablet (6.25 mg total) by mouth 2 (two) times daily. [name] D, PA-C Taking Active   clopidogrel (PLAVIX) 75 MG tablet [id number] Yes Take 1 tablet (75 mg total) by mouth daily. [name] D, PA-C Taking Active   Continuous Blood Gluc Receiver (FREESTYLE LIBRE 2 READER) [place] [id number] Yes Use with sensor to check blood sugars 6 times daily. [name], NP Taking Active   Continuous Blood Gluc Sensor (FREESTYLE LIBRE 2 SENSOR) [place] [id number] Yes Use to check blood sugars 6 times daily. [name], NP Taking Active   enalapril (VASOTEC) 20 MG tablet [id number] Yes Take 1 tablet (20 mg total) by mouth daily. [name] D, PA-C Taking Active   ezetimibe (ZETIA) 10 MG tablet [id number] Yes Take 1 tablet (10 mg total) by mouth daily. For cholesterol. [name] D, PA-C Taking Active   furosemide (LASIX) 20 MG tablet [id number] Yes Take 1 tablet (20 mg total) by mouth daily as needed for edema. [name] D, PA-C Taking Active   gabapentin (NEURONTIN) 300 MG capsule [id number] Yes Take 1 capsule (300 mg total) by mouth 3 (three) times daily. Gillis Santa, MD Taking Active   glucose blood (FREESTYLE LITE) test strip [id number] Yes Test up to four times a day as needed. DX [name], NP Taking Active   insulin aspart (NOVOLOG FLEXPEN) 100 UNIT/ML FlexPen [id number] Yes Inject 22  units with breakfast and lunch, inject 14 units with dinner. [name], NP Taking Active   insulin glargine (LANTUS SOLOSTAR) 100 UNIT/ML Solostar Pen [id number] Yes Inject 60 Units into the skin at bedtime. [name], NP Taking Active   Insulin Pen Needle (INSUPEN PEN NEEDLES) 32G X 4 MM MISC [id number] Yes Use to inject insulin 4 times daily. [name], NP Taking Active Self  isosorbide mononitrate (IMDUR) 30 MG 24 hr tablet [id number] Yes Take 1 tablet (30 mg) by mouth once daily at bedtime Minna Merritts, MD Taking Active   levothyroxine (SYNTHROID) 100 MCG tablet [id number] Yes Take 1 tablet by mouth on an empty stomach with water only on Sunday, Monday, and Tuesday. No food or other medications for 30 minutes. [name], NP Taking Active   levothyroxine (SYNTHROID) 88 MCG tablet [id number] Yes Take 1 tablet by mouth every morning on an empty stomach with water only on Wednesday, Thursday, Friday, Saturday.  No food or other medications for 30 minutes. [name], NP Taking Active   meloxicam [facility]) 15 MG tablet [id number] Yes Take 15 mg by mouth daily. [provider] Taking Active   neomycin-polymyxin b-dexamethasone (MAXITROL) 3.5-10000-0.1 SUSP [id number] Yes Place 1 drop into the left eye 3 (three) times daily. [provider] Taking Active Self  potassium chloride (KLOR-CON) 10 MEQ tablet [id number] Yes Only take on days you take your lasix. Take 1 tablet (10 mEq total) by mouth for 1 dose on those days you take your lasix 65m pill. VMarrianne MoodD, PA-C Taking Active   rosuvastatin (CRESTOR) 40 MG tablet [id number] Take 1 tablet (40 mg total) by mouth every evening. For cholesterol. VMarrianne MoodD, PA-C Taking Active   spironolactone (ALDACTONE) 25 MG tablet [id number] Take 1 tablet (25 mg total) by mouth daily. VMarrianne MoodD, PA-C Taking Active   traZODone (DESYREL) 100 MG tablet [id number] TAKE 1 TABLET BY  MOUTH AT BEDTIME AS NEEDED FOR SLEEP CPleas Koch NP Taking Active   Med List Note ([place]neta [place]  Carlean Jews, RN 02/11/18 0851): 02-11-18 Message sent to Dr. Rockey Situ in Epic for permission to stop Plavix 7 days. DW            Patient Active Problem List   Diagnosis Date Noted   Preventative health care 02/15/2021   Abdominal bloating 03/22/2019   Vaginal burning 03/22/2019   Foul smelling urine 03/22/2019   Acute pain of left shoulder 01/17/2019   Chronic musculoskeletal pain 11/15/2018   Atherosclerosis of native coronary artery of native heart with stable angina pectoris ([place]) 05/17/2018   Chronic neck pain 02/10/2018   Chronic pain of right upper extremity 02/10/2018   Cervical spondylosis 02/10/2018   Neck pain 01/19/2018   Right shoulder pain 01/19/2018   Chronic pain syndrome 01/19/2018   Lumbar degenerative disc disease 09/29/2017   Lumbar spondylosis with myelopathy 09/29/2017   Chronic bilateral back pain 05/19/2017   Insomnia 01/12/2017   Cannabis use disorder, mild, abuse 09/03/2016   Cognitive changes 09/03/2016   Takotsubo cardiomyopathy 01/02/2016   Frequent PVCs 01/02/2016   HTN (hypertension) 09/20/2015   Benign neoplasm of colon 11/02/2014   Internal hemorrhoids 11/02/2014   Adjustment disorder with mixed anxiety and depressed mood 08/16/2014   Esophageal reflux 05/23/2014   Diverticulosis of colon without hemorrhage 05/17/2014   Peripheral polyneuropathy 05/05/2014   Tobacco dependence 05/05/2014   COPD, moderate ([place]) 03/29/2013   Noncompliance with diabetes treatment 02/27/2013   Obstructive sleep apnea 16/04/9603   Chronic systolic congestive heart failure ([place]) 54/03/8118   Lichen sclerosus et atrophicus 05/20/2012   Bronchitis, chronic obstructive ([place]) 05/20/2012   Diabetes mellitus type 2 with peripheral artery disease ([place]) 08/28/2011   History of lumbar fusion 06/29/2011   Depression 06/24/2011   Hypothyroidism 05/03/2011   CAD, multiple  vessel 02/24/2011   Tobacco abuse 02/24/2011   Mixed hyperlipidemia 02/24/2011   Postural dizziness with near syncope 02/24/2011    Immunization History  Administered Date(s) Administered   Fluad Quad(high Dose 65+) 04/02/2020   Influenza Split 05/20/2012   Influenza, High Dose Seasonal PF 04/17/2016, 04/17/2017, 05/04/2018   Influenza, Seasonal, Injecte, Preservative Fre 05/07/2006   Influenza,inj,Quad PF,6+ Mos 04/20/2013, 04/26/2014, 06/01/2015, 04/25/2019   PFIZER(Purple Top)SARS-COV-2 Vaccination 09/15/2019, 10/11/2019, 06/05/2020   Pneumococcal Conjugate-13 04/26/2014   Pneumococcal Polysaccharide-23 03/28/2013, 04/25/2019   Tdap 03/28/2013    Conditions to be addressed/monitored:  Hypertension and Diabetes  Patient Care Plan: CCM Pharmacy Care Plan     Problem Identified: CHL AMB "PATIENT-SPECIFIC PROBLEM"      Long-Range Goal: Disease Management   Start Date: 09/26/2020  This Visit's Progress: Not on track  Recent Progress: Not on track  Priority: High  Note:   Current Barriers:  Unable to achieve control of diabetes and blood pressure   Pharmacist Clinical Goal(s):  Patient will achieve control of diabetes and blood pressure as evidenced by home BG /BP log through collaboration with PharmD and provider.   Interventions: 1:1 collaboration with [name], NP regarding development and update of comprehensive plan of care as evidenced by provider attestation and co-signature Inter-disciplinary care team collaboration (see longitudinal plan of care) Comprehensive medication review performed; medication list updated in electronic medical record  Hypertension (BP goal <130/80) -Followed by cardiology at this time, recent adjustments made 06/12/21 -Current treatment: Enalapril 10 mg - 1 tablet daily  Carvedilol 3.150m - 1 BID Isosorbide mononitrate 30 mg - 1 tablet daily Lasix 20 mg - 1 tablet three days per week -Medications previously tried: none  reported -Current home readings: following  with cardiology  -See recent cardio notes - pt should home monitor. Recommend tobacco and alcohol cessation. -Counseled to monitor BP at home with symptoms, document, and provide log at future appointments -Recommended to continue current medication; Keep record of home BP daily.  Hyperlipidemia: (LDL goal < 70) -Controlled - LDL 29 -Current treatment: Rosuvastatin 40 mg - 1 tablet daily Zetia 10 mg - 1 tablet daily  -Medications previously tried: none -Adherence affirmed -Recommended to continue current medication  Diabetes (A1c goal <7%) -Uncontrolled - with frequent lows; Asked patient to keep a diet and insulin log for 2 weeks starting 06/11/21 for follow up today. She did not have detailed log today. -Current medications: Novolog flexpen - Inject 22 before breakfast and lunch and 14 before dinner Lantus solostar - Inject 60 units every evening (11 PM) Freestyle Libre Monitor  -Medications previously tried: none -Current home glucose readings: She uses the [employer] 2 system with reader. She has an alarm when it reaches 70. She scans about 6-7/day. 11/26 - 57 fasting (4 AM), 2 hours after meal 130, 2 hours after lunch 68, dinnertime 160 06/22/21 - 93 fasting --> did not check again due to not feeling well 06/23/21 - 77 fasting, 2 hours after breakfast 117, 2 hours after dinner 85 06/24/21 - 178 fasting -Current meal patterns: She reports she has cut out all "sweets". Discussed importance of eating 3 meals per day and keeping carbohydrate levels steady due to her tendency for low BG. -Recommended schedule follow up with PCP. Keep log of insulin dosing.  Patient Goals/Self-Care Activities Over the next 90 days, patient will:  - check glucose before meals and bedtime, document, and provide at future appointments  Follow Up Plan:  -Patient to schedule PCP visit before any further contact with CCM team.     Medication Assistance:  None required.  Patient affirms current coverage meets needs.  Star Rating Drugs:  Medication:                Last Fill:         Day Supply Rosuvastatin 40 mg    04/16/2021      90 Enalapril 20 mg           04/16/2021      90                                  Patient's preferred pharmacy is:  [facility] [address], [place] - [place] [address] Phone: [phone] Fax: [phone]  [place], [place] [address] Phone: [phone] Fax: [phone]  [facility] [place], [place] - [address] [place] [address] Phone: [phone] Fax: [phone]  Reports medication compliance. Most meds from [place] at no cost.   Care Plan and Follow Up Patient Decision:  Patient agrees to Care Plan and Follow-up.  [name], PharmD Clinical Pharmacist [facility] at [facility] [phone]

## 2021-06-25 NOTE — Patient Instructions (Signed)
Dear Sarah Payne,  Below is a summary of the goals we discussed during our follow up appointment on June 25, 2021. Please contact me anytime with questions or concerns.   Visit Information  Patient Care Plan: CCM Pharmacy Care Plan     Problem Identified: CHL AMB "PATIENT-SPECIFIC PROBLEM"      Long-Range Goal: Disease Management   Start Date: 09/26/2020  This Visit's Progress: Not on track  Recent Progress: Not on track  Priority: High  Note:   Current Barriers:  Unable to achieve control of diabetes and blood pressure   Pharmacist Clinical Goal(s):  Patient will achieve control of diabetes and blood pressure as evidenced by home BG /BP log through collaboration with PharmD and provider.   Interventions: 1:1 collaboration with [name], NP regarding development and update of comprehensive plan of care as evidenced by provider attestation and co-signature Inter-disciplinary care team collaboration (see longitudinal plan of care) Comprehensive medication review performed; medication list updated in electronic medical record  Hypertension (BP goal <130/80) -Followed by cardiology at this time, recent adjustments made 06/12/21 -Current treatment: Enalapril 10 mg - 1 tablet daily  Carvedilol 3.125mg  - 1 BID Isosorbide mononitrate 30 mg - 1 tablet daily Lasix 20 mg - 1 tablet three days per week -Medications previously tried: none reported -Current home readings: following with cardiology  -See recent cardio notes - pt should home monitor. Recommend tobacco and alcohol cessation. -Counseled to monitor BP at home with symptoms, document, and provide log at future appointments -Recommended to continue current medication; Keep record of home BP daily.  Hyperlipidemia: (LDL goal < 70) -Controlled - LDL 29 -Current treatment: Rosuvastatin 40 mg - 1 tablet daily Zetia 10 mg - 1 tablet daily  -Medications previously tried: none -Adherence affirmed -Recommended to  continue current medication  Diabetes (A1c goal <7%) -Uncontrolled - with frequent lows; Asked patient to keep a diet and insulin log for 2 weeks starting 06/11/21 for follow up today. She did not have detailed log today. -Current medications: Novolog flexpen - Inject 22 before breakfast and lunch and 14 before dinner Lantus solostar - Inject 60 units every evening (11 PM) Freestyle Libre Monitor  -Medications previously tried: none -Current home glucose readings: She uses the [employer] 2 system with reader. She has an alarm when it reaches 70. She scans about 6-7/day. 11/26 - 57 fasting (4 AM), 2 hours after meal 130, 2 hours after lunch 68, dinnertime 160 06/22/21 - 93 fasting --> did not check again due to not feeling well 06/23/21 - 77 fasting, 2 hours after breakfast 117, 2 hours after dinner 85 06/24/21 - 178 fasting -Current meal patterns: She reports she has cut out all "sweets". Discussed importance of eating 3 meals per day and keeping carbohydrate levels steady due to her tendency for low BG. -Recommended schedule follow up with PCP. Keep log of insulin dosing.  Patient Goals/Self-Care Activities Over the next 90 days, patient will:  - check glucose before meals and bedtime, document, and provide at future appointments  Follow Up Plan:  -Patient to schedule PCP visit before any further contact with CCM team.      Patient verbalizes understanding of instructions provided today and agrees to view in [place].   [name], PharmD Clinical Pharmacist Practitioner [facility] at [facility] [phone]

## 2021-07-05 ENCOUNTER — Telehealth: Payer: Self-pay

## 2021-07-05 DIAGNOSIS — E1151 Type 2 diabetes mellitus with diabetic peripheral angiopathy without gangrene: Secondary | ICD-10-CM

## 2021-07-05 DIAGNOSIS — E039 Hypothyroidism, unspecified: Secondary | ICD-10-CM

## 2021-07-05 DIAGNOSIS — E782 Mixed hyperlipidemia: Secondary | ICD-10-CM

## 2021-07-05 NOTE — Telephone Encounter (Addendum)
Noted, which prescription dose?  Levothyroxine 88 mcg or levothyroxine 100 mcg?  She is on 2 doses.  Patient is overdue for diabetes follow-up, please schedule.

## 2021-07-05 NOTE — Telephone Encounter (Signed)
Request received from pharmacy via fax to change manufacture Euthyrox not available.

## 2021-07-05 NOTE — Telephone Encounter (Signed)
147mcg

## 2021-07-05 NOTE — Telephone Encounter (Addendum)
It looks like I sent in levothyroxine (Synthroid), not Euthyrox. Have the pharmacy change the manufacturer on their end, I sent an the correct one from our end.  Also, did you get in touch with her to schedule a follow-up visit for diabetes?

## 2021-07-09 ENCOUNTER — Encounter: Payer: Self-pay | Admitting: Student in an Organized Health Care Education/Training Program

## 2021-07-09 ENCOUNTER — Ambulatory Visit
Payer: Medicare PPO | Attending: Student in an Organized Health Care Education/Training Program | Admitting: Student in an Organized Health Care Education/Training Program

## 2021-07-09 ENCOUNTER — Other Ambulatory Visit: Payer: Self-pay

## 2021-07-09 VITALS — BP 138/95 | HR 82 | Temp 97.2°F | Resp 16 | Ht 67.0 in | Wt 150.0 lb

## 2021-07-09 DIAGNOSIS — M5136 Other intervertebral disc degeneration, lumbar region: Secondary | ICD-10-CM | POA: Diagnosis not present

## 2021-07-09 DIAGNOSIS — M7918 Myalgia, other site: Secondary | ICD-10-CM | POA: Diagnosis not present

## 2021-07-09 DIAGNOSIS — F17200 Nicotine dependence, unspecified, uncomplicated: Secondary | ICD-10-CM | POA: Insufficient documentation

## 2021-07-09 DIAGNOSIS — G894 Chronic pain syndrome: Secondary | ICD-10-CM | POA: Insufficient documentation

## 2021-07-09 DIAGNOSIS — M47812 Spondylosis without myelopathy or radiculopathy, cervical region: Secondary | ICD-10-CM | POA: Insufficient documentation

## 2021-07-09 DIAGNOSIS — G8929 Other chronic pain: Secondary | ICD-10-CM | POA: Diagnosis not present

## 2021-07-09 DIAGNOSIS — Z720 Tobacco use: Secondary | ICD-10-CM | POA: Insufficient documentation

## 2021-07-09 DIAGNOSIS — G629 Polyneuropathy, unspecified: Secondary | ICD-10-CM

## 2021-07-09 MED ORDER — GABAPENTIN 300 MG PO CAPS: 300.0000 mg | ORAL_CAPSULE | Freq: Three times a day (TID) | ORAL | 5 refills | Status: DC

## 2021-07-09 MED ORDER — AMITRIPTYLINE HCL 50 MG PO TABS: 50.0000 mg | ORAL_TABLET | Freq: Every evening | ORAL | 5 refills | Status: DC | PRN

## 2021-07-09 NOTE — Progress Notes (Signed)
PROVIDER NOTE: Information contained herein reflects review and annotations entered in association with encounter. Interpretation of such information and data should be left to medically-trained personnel. Information provided to patient can be located elsewhere in the medical record under "Patient Instructions". Document created using STT-dictation technology, any transcriptional errors that may result from process are unintentional.    Patient: Sarah Payne  Service Category: E/M  Provider: Gillis Santa, MD  DOB: Jun 18, 1946  DOS: 07/09/2021  Specialty: Interventional Pain Management  MRN: [id number]  Setting: Ambulatory outpatient  PCP: [name], NP  Type: Established Patient    Referring Provider: [name], NP  Location: Office  Delivery: Face-to-face     HPI  [name], a 75 y.o. year old female, is here today because of her Peripheral polyneuropathy [G62.9]. Ms. Dibenedetto primary complain today is Back Pain (lower)  Last encounter: My last encounter with her was on 01/16/21  Pertinent problems: Ms. Jonsson has CAD, multiple vessel; History of lumbar fusion; Diabetes mellitus type 2 with peripheral artery disease ([place]); Bronchitis, chronic obstructive ([place]); Obstructive sleep apnea; COPD, moderate ([place]); Lumbar degenerative disc disease; Lumbar spondylosis with myelopathy; Cannabis use disorder, mild, abuse; Peripheral polyneuropathy; Tobacco dependence; Chronic pain syndrome; Chronic neck pain; Chronic pain of right upper extremity; Cervical spondylosis; and Chronic musculoskeletal pain on their pertinent problem list. Pain Assessment: Severity of Chronic pain is reported as a 1 /10. Location: Back Left, Right/Denies. Onset: More than a month ago. Quality: Burning, Aching, Constant. Timing: Constant. Modifying factor(s): meds. Vitals:  height is _0  (1.702 m) and weight is 150 lb (68 kg). Her temperature is 97.2 F (36.2 C) (abnormal). Her blood pressure is 138/95  (abnormal) and her pulse is 82. Her respiration is 16 and oxygen saturation is 99%.   Reason for encounter: medication management.   No change in medical history since last visit.  Patient's pain is at baseline.  Patient continues multimodal pain regimen as prescribed.  States that it provides pain relief and improvement in functional status. Denies paraesthesias of feet  ROS  Constitutional: Denies any fever or chills Gastrointestinal: No reported hemesis, hematochezia, vomiting, or acute GI distress Musculoskeletal:  Low back pain Neurological: No reported episodes of acute onset apraxia, aphasia, dysarthria, agnosia, amnesia, paralysis, loss of coordination, or loss of consciousness  Medication Review  FreeStyle Libre 2 Reader, FreeStyle Libre 2 Sensor, Insulin Pen Needle, albuterol, amitriptyline, carvedilol, clopidogrel, enalapril, ezetimibe, furosemide, gabapentin, glucose blood, insulin aspart, insulin glargine, isosorbide mononitrate, levothyroxine, meloxicam, neomycin-polymyxin b-dexamethasone, potassium chloride, rosuvastatin, spironolactone, and traZODone  History Review  Allergy: Ms. Kohls is allergic to no known allergies. Drug: Ms. Ricciardi  reports current drug use. Drug: Marijuana. Alcohol:  reports no history of alcohol use. Tobacco:  reports that she has been smoking cigarettes. She has a 45.00 pack-year smoking history. She has never used smokeless tobacco. Social: Ms. Igoe  reports that she has been smoking cigarettes. She has a 45.00 pack-year smoking history. She has never used smokeless tobacco. She reports current drug use. Drug: Marijuana. She reports that she does not drink alcohol. Medical:  has a past medical history of Acute systolic (congestive) heart failure ([place]) (01/01/2016), Cataract, Chronic low back pain, COPD (chronic obstructive pulmonary disease) ([place]), Coronary artery disease, Depression, Diabetes mellitus, Diverticulosis, Frequent PVCs, GERD  (gastroesophageal reflux disease), Hyperlipidemia, Hypertension, Hypothyroidism, Impingement syndrome of left shoulder, Marijuana abuse, Myocardial infarction [facility]), Sleep apnea, Takotsubo cardiomyopathy, Tendonitis of left rotator cuff, Tobacco abuse, and Vascular dementia. Surgical: Ms.  Diver  has a past surgical history that includes Back surgery; Appendectomy; Abdominal hysterectomy; Colonoscopy (N/A, 11/02/2014); Cataract extraction w/PHACO (Left, 01/30/2015); Cardiac catheterization (2013); Coronary angioplasty (2012); Coronary angioplasty with stent (2013); Cardiac catheterization (2016); Cataract extraction w/PHACO (Right, 02/13/2015); Cardiac catheterization (N/A, 01/01/2016); Esophagogastroduodenoscopy (egd) with propofol (N/A, 09/21/2018); Shoulder surgery (Left, 2017); ABDOMINAL AORTOGRAM W/LOWER EXTREMITY (N/A, 09/12/2020); PERIPHERAL VASCULAR ATHERECTOMY (Left, 09/12/2020); and PERIPHERAL VASCULAR BALLOON ANGIOPLASTY (09/12/2020). Family: family history includes Colon cancer in her sister; Heart attack in her mother; Heart disease in her father and mother; Liver cancer in her brother; Throat cancer in her brother.  Laboratory Chemistry Profile   Renal Lab Results  Component Value Date   BUN 29 (H) 04/19/2021   CREATININE 1.67 (H) 04/19/2021   BCR 17 04/19/2021   GFR 44.80 (L) 08/03/2020   GFRAA 55 (L) 08/30/2020   GFRNONAA 43 (L) 01/08/2021     Hepatic Lab Results  Component Value Date   AST 23 08/03/2020   ALT 22 08/03/2020   ALBUMIN 4.2 08/03/2020   ALKPHOS 68 08/03/2020   HCVAB NEGATIVE 03/12/2015   AMYLASE 32 04/11/2016   LIPASE 55.0 04/11/2016     Electrolytes Lab Results  Component Value Date   NA 139 04/19/2021   K 4.9 04/19/2021   CL 99 04/19/2021   CALCIUM 9.5 04/19/2021   MG 1.1 (L) 06/12/2014     Bone Lab Results  Component Value Date   VD25OH 41 02/25/2013     Inflammation (CRP: Acute Phase) (ESR: Chronic Phase) Lab Results  Component Value Date   CRP  2.1 04/11/2013   ESRSEDRATE 58 (H) 04/11/2013       Note: Above Lab results reviewed.  Recent Imaging Review  LONG TERM MONITOR-LIVE TELEMETRY (3-14 DAYS) Event monitor Patch Wear Time:  11 days and 19 hours (2022-09-23T12:53:05-399 to  2022-10-05T07:53:16-0400)  Normal sinus rhythm Patient had a min HR of 53 bpm, max HR of 146 bpm, and avg HR of 80 bpm.   Bundle Branch Block/IVCD was present.  3 Supraventricular Tachycardia runs occurred, the run with the fastest  interval lasting 5 beats with a max rate of 146 bpm, the longest lasting 7  beats with an avg rate of 134 bpm.   Isolated SVEs were rare (<1.0%), SVE Couplets were rare (<1.0%), and SVE  Triplets were rare (<1.0%).  Isolated VEs were rare (<1.0%), VE Couplets were rare (<1.0%), and no VE  Triplets were present. Ventricular Trigeminy was present.  Rare patient triggered events associated with ectopy  Signed, Esmond Plants, MD, Ph.D [facility]  Note: Reviewed        Physical Exam  General appearance: Well nourished, well developed, and well hydrated. In no apparent acute distress Mental status: Alert, oriented x 3 (person, place, & time)       Respiratory: No evidence of acute respiratory distress Eyes: PERLA Vitals: BP (!) 138/95    Pulse 82    Temp (!) 97.2 F (36.2 C)    Resp 16    Ht _0  (1.702 m)    Wt 150 lb (68 kg)    SpO2 99%    BMI 23.49 kg/m  BMI: Estimated body mass index is 23.49 kg/m as calculated from the following:   Height as of this encounter: _1  (1.702 m).   Weight as of this encounter: 150 lb (68 kg). Ideal: Ideal body weight: 61.6 kg (135 lb 12.9 oz) Adjusted ideal body weight: 64.2 kg (141 lb 7.7 oz)  Lumbar Spine  Area Exam  Skin & Axial Inspection: No masses, redness, or swelling Alignment: Symmetrical Functional ROM: Pain restricted ROM       Stability: No instability detected Muscle Tone/Strength: Functionally intact. No obvious neuro-muscular anomalies detected. Sensory  (Neurological): Musculoskeletal pain pattern  Lower Extremity Exam    Side: Right lower extremity  Side: Left lower extremity  Stability: No instability observed          Stability: No instability observed          Skin & Extremity Inspection: Skin color, temperature, and hair growth are WNL. No peripheral edema or cyanosis. No masses, redness, swelling, asymmetry, or associated skin lesions. No contractures.  Skin & Extremity Inspection: Skin color, temperature, and hair growth are WNL. No peripheral edema or cyanosis. No masses, redness, swelling, asymmetry, or associated skin lesions. No contractures.  Functional ROM: Pain restricted ROM of the right hip joint, right SI joint                  Functional ROM: Unrestricted ROM                  Muscle Tone/Strength: Functionally intact. No obvious neuro-muscular anomalies detected.  Muscle Tone/Strength: Functionally intact. No obvious neuro-muscular anomalies detected.  Sensory (Neurological): Musculoskeletal pain pattern        Sensory (Neurological): Unimpaired        DTR: Patellar: deferred today Achilles: deferred today Plantar: deferred today  DTR: Patellar: deferred today Achilles: deferred today Plantar: deferred today  Palpation: No palpable anomalies  Palpation: No palpable anomalies    Assessment   Status Diagnosis  Controlled Controlled Controlled 1. Peripheral polyneuropathy   2. Chronic musculoskeletal pain   3. Tobacco abuse   4. Lumbar degenerative disc disease   5. Osteoarthritis of cervical spine, unspecified spinal osteoarthritis complication status   6. Tobacco dependence   7. Chronic pain syndrome      Plan of Care   [name] has a current medication list which includes the following long-term medication(s): albuterol, carvedilol, enalapril, ezetimibe, furosemide, novolog flexpen, lantus solostar, isosorbide mononitrate, levothyroxine, levothyroxine, potassium chloride, rosuvastatin, spironolactone,  trazodone, amitriptyline, and gabapentin.  Pharmacotherapy (Medications Ordered): Meds ordered this encounter  Medications   gabapentin (NEURONTIN) 300 MG capsule    Sig: Take 1 capsule (300 mg total) by mouth 3 (three) times daily.    Dispense:  90 capsule    Refill:  5   amitriptyline (ELAVIL) 50 MG tablet    Sig: Take 1 tablet (50 mg total) by mouth at bedtime as needed for sleep.    Dispense:  30 tablet    Refill:  5    No orders of the defined types were placed in this encounter.  Smoking cessation instruction/counseling given:  counseled patient on the dangers of tobacco use, advised patient to stop smoking, and reviewed strategies to maximize success  Follow-up plan:   Return in about 6 months (around 01/07/2022) for Medication Management, in person.   Recent Visits No visits were found meeting these conditions. Showing recent visits within past 90 days and meeting all other requirements Today's Visits Date Type Provider Dept  07/09/21 Office Visit Gillis Santa, MD [facility]  Showing today's visits and meeting all other requirements Future Appointments No visits were found meeting these conditions. Showing future appointments within next 90 days and meeting all other requirements  I discussed the assessment and treatment plan with the patient. The patient was provided an opportunity to ask  questions and all were answered. The patient agreed with the plan and demonstrated an understanding of the instructions.  Patient advised to call back or seek an in-person evaluation if the symptoms or condition worsens.  Duration of encounter: 65mnutes.  Note by: BGillis Santa MD Date: 07/09/2021; Time: 11:17 AM

## 2021-07-09 NOTE — Progress Notes (Signed)
Safety precautions to be maintained throughout the outpatient stay will include: orient to surroundings, keep bed in low position, maintain call bell within reach at all times, provide assistance with transfer out of bed and ambulation.  

## 2021-07-10 NOTE — Telephone Encounter (Signed)
Everything has been clarified with pharmacy.   Called patient to make appointment declined coming to our office do you want her to go to [place] and do labs and do virtual with you or hold off until we are back in office?

## 2021-07-11 NOTE — Telephone Encounter (Signed)
No answer no vm

## 2021-07-11 NOTE — Telephone Encounter (Signed)
Labs at [facility] with virtual visit for diabetes with me. Thanks.

## 2021-07-16 NOTE — Addendum Note (Signed)
Addended by: [name] on: 07/16/2021 04:25 PM   Modules accepted: Orders

## 2021-07-16 NOTE — Telephone Encounter (Signed)
Pt made a lab appointment at [place]  but pt stated she don't know her cellphone number and kate has to call her and talk to her for her appointment .. I did not make a virtual visit for pt

## 2021-07-16 NOTE — Telephone Encounter (Addendum)
Can we get some clarification?  If we cannot call her or meet with her virtually, AND she refuses to come to our office then I'm not sure what I can do to help. She used to drive to [facility] when seeing endocrinology, not sure why she cannot drive out to see [place].  She really needs to be seen for diabetes follow up.  If she refuses and we cannot connect via phone or video, then cancel her lab appointment and schedule her for an in office visit for February.   Again, it is my recommendation she be seen.

## 2021-07-16 NOTE — Telephone Encounter (Signed)
This is fine. Lab orders entered.  She will need an in person office visit for 3 months. Let's schedule that when we call her for her phone visit next week.

## 2021-07-16 NOTE — Telephone Encounter (Signed)
Called patient she did not have way to do virtual. She does not have cell phone number or way to get email or check my chart. She is ok with doing phone call on home number that we have called. I have set her up for that for when you are back in the office. Is that ok?

## 2021-07-16 NOTE — Telephone Encounter (Signed)
Please call patient make lab appointment in [place] and virtual follow up 3 days later with Anda Kraft.

## 2021-07-17 ENCOUNTER — Other Ambulatory Visit: Payer: Medicare PPO

## 2021-07-17 NOTE — Telephone Encounter (Signed)
Noted in appointment note for me to make appointment when I call her

## 2021-07-18 ENCOUNTER — Other Ambulatory Visit: Payer: Self-pay

## 2021-07-18 ENCOUNTER — Other Ambulatory Visit (INDEPENDENT_AMBULATORY_CARE_PROVIDER_SITE_OTHER): Payer: Medicare PPO

## 2021-07-18 DIAGNOSIS — E1151 Type 2 diabetes mellitus with diabetic peripheral angiopathy without gangrene: Secondary | ICD-10-CM | POA: Diagnosis not present

## 2021-07-18 DIAGNOSIS — E039 Hypothyroidism, unspecified: Secondary | ICD-10-CM | POA: Diagnosis not present

## 2021-07-18 DIAGNOSIS — E782 Mixed hyperlipidemia: Secondary | ICD-10-CM | POA: Diagnosis not present

## 2021-07-19 LAB — LIPID PANEL
Cholesterol: 112 mg/dL (ref 0–200)
HDL: 36.4 mg/dL — ABNORMAL LOW (ref >39.00)
LDL Cholesterol: 39 mg/dL (ref 0–99)
NonHDL: 75.21
Total CHOL/HDL Ratio: 3
Triglycerides: 182 mg/dL — ABNORMAL HIGH (ref 0.0–149.0)
VLDL: 36.4 mg/dL (ref 0.0–40.0)

## 2021-07-19 LAB — TSH: TSH: 1.89 u[IU]/mL (ref 0.35–5.50)

## 2021-07-19 LAB — HEMOGLOBIN A1C: Hgb A1c MFr Bld: 8.1 % — ABNORMAL HIGH (ref 4.6–6.5)

## 2021-07-23 ENCOUNTER — Ambulatory Visit (INDEPENDENT_AMBULATORY_CARE_PROVIDER_SITE_OTHER): Payer: Medicare PPO | Admitting: Cardiovascular Disease

## 2021-07-23 ENCOUNTER — Encounter: Payer: Self-pay | Admitting: Cardiovascular Disease

## 2021-07-23 ENCOUNTER — Other Ambulatory Visit: Payer: Self-pay

## 2021-07-23 VITALS — BP 110/68 | HR 62 | Ht 67.0 in | Wt 149.0 lb

## 2021-07-23 DIAGNOSIS — R42 Dizziness and giddiness: Secondary | ICD-10-CM

## 2021-07-23 DIAGNOSIS — I10 Essential (primary) hypertension: Secondary | ICD-10-CM | POA: Diagnosis not present

## 2021-07-23 MED ORDER — MECLIZINE HCL 25 MG PO TABS: ORAL_TABLET | ORAL | 1 refills | Status: DC

## 2021-07-23 NOTE — Progress Notes (Signed)
Cardiology Office Note  Date:  07/23/2021   ID:  Sarah Payne, Sarah Payne Jun 01, 1946, MRN [id number]  PCP:  [name], NP   Chief Complaint  Patient presents with   1 month follow up     Patient c/o shortness of breath. Medications reviewed by the patient verbally.     HPI:  76 year old woman with a long history of  smoking,  coronary artery disease,  prior stent x3 placed to the mid left circumflex with moderate to severe LAD disease ,  stent placed to her distal RCA in February 2013,  stent to the mid left circumflex in March 2013 ,  poorly controlled diabetes with hemoglobin A1c of 10,  depression,  hypertension,  cardiac catheterization 08/19/2012 showing moderate mid LAD disease at the takeoff of the diagonal vessel, severe ostial to mid RCA disease, ejection fraction greater than 55%. She had FFR pressure wire showing severe disease of the RCA with drug-eluting stent x2 placed. C. difficile late 2015 after antibiotics Previous history of running out of her medications cardiac catheterization performed showing patent stents 12/2015 She presents today for follow-up of her coronary artery disease,    Last seen in clinic by myself nov 2022 At that time blood pressure low, was orthostatic It was felt her dizzy spells probably secondary to low blood pressure  She continues to have "spells" on today's visit, brings in blood pressure and heart rate recordings when she is having these spells Describes it as her eyes are crossing over, room spinning Likely having vertigo, can have episodes sitting and standing, moving head around Happening for Months Most of her blood pressures 110 up to 130 when she is having episodes heart rates in the 70s  We reduced many of the doses of her medications on last clinic visit and reports that she did fine for 2 weeks then started having "head spinning again"  Denies claudication symptoms Continues to smoke 1 pack/day despite our best  efforts  EKG personally reviewed by myself on todays visit Normal sinus rhythm rate 62 bpm left bundle branch block no change  Other past medical history reviewed lower extremity angiography with Dr. Fletcher Anon February 2022 1.  No significant aortoiliac disease. 2.  Left lower extremity: Severe calcified disease in the distal SFA with two-vessel runoff below the knee with an occluded anterior tibial artery.  The posterior tibial artery has moderate diffuse disease in the mid to distal segment. 3.  Successful orbital atherectomy and drug-coated balloon angioplasty to the left SFA.  Echo 03/2021:  Normal EF, no pulm htn  Recent thyroid issues,sugars high, hyperthyroidism Dose of thyroid medication adjusted per the patient Now feeling better  Echo 10/21   1. Left ventricular ejection fraction, by estimation, is 50 to 55%. The  left ventricle has low normal function. The left ventricle has no regional  wall motion abnormalities. There is mild left ventricular hypertrophy.  Left ventricular diastolic  parameters are consistent with Grade II diastolic dysfunction  (pseudonormalization).   2. Right ventricular systolic function is normal. The right ventricular  size is normal. Mildly increased right ventricular wall thickness.   3. The mitral valve is normal in structure. No evidence of mitral valve  regurgitation. No evidence of mitral stenosis.   4. The aortic valve is grossly normal. Aortic valve regurgitation is not  visualized. Mild aortic valve sclerosis is present, with no evidence of  aortic valve stenosis.   5. The inferior vena cava is normal in size with greater  than 50%  respiratory variability, suggesting right atrial pressure of 3 mmHg.    Chronic severe back pain, Seen by neurosurgery.  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 as ejection fraction was depressed Echocardiogram June 2017, ejection fraction 25-30% She had follow-up echocardiogram   September 2017 showing normal LV functio   PMH:   has a past medical history of Acute systolic (congestive) heart failure ([place]) (01/01/2016), Cataract, Chronic low back pain, COPD (chronic obstructive pulmonary disease) ([place]), Coronary artery disease, Depression, Diabetes mellitus, Diverticulosis, Frequent PVCs, GERD (gastroesophageal reflux disease), Hyperlipidemia, Hypertension, Hypothyroidism, Impingement syndrome of left shoulder, Marijuana abuse, Myocardial infarction [facility]), Sleep apnea, Takotsubo cardiomyopathy, Tendonitis of left rotator cuff, Tobacco abuse, and Vascular dementia.  PSH:    Past Surgical History:  Procedure Laterality Date   ABDOMINAL AORTOGRAM W/LOWER EXTREMITY N/A 09/12/2020   Procedure: ABDOMINAL AORTOGRAM W/LOWER EXTREMITY;  Surgeon: Wellington Hampshire, MD;  Location: [facility];  Service: Cardiovascular;  Laterality: N/A;   ABDOMINAL HYSTERECTOMY     APPENDECTOMY     BACK SURGERY     CARDIAC CATHETERIZATION  2013   CARDIAC CATHETERIZATION  2016   CARDIAC CATHETERIZATION N/A 01/01/2016   Procedure: Left Heart Cath and Coronary Angiography;  Surgeon: Jettie Booze, MD;  Location: [facility];  Service: Cardiovascular;  Laterality: N/A;   CATARACT EXTRACTION W/PHACO Left 01/30/2015   Procedure: CATARACT EXTRACTION PHACO AND INTRAOCULAR LENS PLACEMENT (IOC);  Surgeon: Birder Robson, MD;  Location: [facility];  Service: Ophthalmology;  Laterality: Left;  [place] 00:47    CATARACT EXTRACTION W/PHACO Right 02/13/2015   Procedure: CATARACT EXTRACTION PHACO AND INTRAOCULAR LENS PLACEMENT (IOC);  Surgeon: Birder Robson, MD;   Location: [facility];  Service: Ophthalmology;  Laterality: Right;  cassette lot # [id number] H [place]  00:29.9 AP  20.7 CDE  6.20   COLONOSCOPY N/A 11/02/2014   Procedure: COLONOSCOPY;  Surgeon: Inda Castle, MD;  Location: [facility];  Service: Endoscopy;  Laterality: N/A;   CORONARY ANGIOPLASTY  2012   stent x 3    CORONARY ANGIOPLASTY WITH STENT PLACEMENT  2013   ESOPHAGOGASTRODUODENOSCOPY (EGD) WITH PROPOFOL N/A 09/21/2018   Procedure: ESOPHAGOGASTRODUODENOSCOPY (EGD) WITH PROPOFOL;  Surgeon: [name], MD;  Location[facility];  Service: Gastroenterology;  Laterality: N/A;   PERIPHERAL VASCULAR ATHERECTOMY Left 09/12/2020   Procedure: PERIPHERAL VASCULAR ATHERECTOMY;  Surgeon: Wellington Hampshire, MD;  Location: [place] [facility];  Service: Cardiovascular;  Laterality: Left;   PERIPHERAL VASCULAR BALLOON ANGIOPLASTY  09/12/2020   Procedure: PERIPHERAL VASCULAR BALLOON ANGIOPLASTY;  Surgeon: Wellington Hampshire, MD;  Location: [facility];  Service: Cardiovascular;;   SHOULDER SURGERY Left 2017    Current Outpatient Medications  Medication Sig Dispense Refill   albuterol (VENTOLIN HFA) 108 (90 Base) MCG/ACT inhaler Inhale 2 puffs into the lungs every 6 (six) hours as needed for wheezing or shortness of breath. 8 g 0   amitriptyline (ELAVIL) 50 MG tablet Take 1 tablet (50 mg total) by mouth at bedtime as needed for sleep. 30 tablet 5   carvedilol (COREG) 3.125 MG tablet Take 1 tablet (3.125 mg total) by mouth 2 (two) times  daily. 180 tablet 3   clopidogrel (PLAVIX) 75 MG tablet Take 1 tablet (75 mg total) by mouth daily. 90 tablet 3   Continuous Blood Gluc Receiver (FREESTYLE LIBRE 2 READER) DEVI Use with sensor to check blood sugars 6 times daily. 1 each 0   Continuous Blood Gluc Sensor (FREESTYLE LIBRE 2 SENSOR) MISC Use to check blood sugars 6 times daily. 3 each 3   enalapril (VASOTEC) 10 MG tablet TAKE 1 TABLET BY MOUTH ONCE DAILY 90 tablet 3   ezetimibe (ZETIA) 10 MG tablet Take 1  tablet (10 mg total) by mouth daily. For cholesterol. 90 tablet 3   furosemide (LASIX) 20 MG tablet Take one tab only on Monday, Wednesday, & Fridays 45 tablet 3   gabapentin (NEURONTIN) 300 MG capsule Take 1 capsule (300 mg total) by mouth 3 (three) times daily. 90 capsule 5   glucose blood (FREESTYLE LITE) test strip Test up to four times a day as needed. DX 100 each 0   insulin aspart (NOVOLOG FLEXPEN) 100 UNIT/ML FlexPen Inject 22 units with breakfast and lunch, inject 14 units with dinner. 45 mL 3   insulin glargine (LANTUS SOLOSTAR) 100 UNIT/ML Solostar Pen Inject 60 Units into the skin at bedtime. 45 mL 3   Insulin Pen Needle (INSUPEN PEN NEEDLES) 32G X 4 MM MISC Use to inject insulin 4 times daily. 250 each 1   isosorbide mononitrate (IMDUR) 30 MG 24 hr tablet Take 1 tablet (30 mg) by mouth once daily at bedtime 90 tablet 0   levothyroxine (SYNTHROID) 100 MCG tablet Take 1 tablet by mouth on an empty stomach with water only on Sunday, Monday, and Tuesday. No food or other medications for 30 minutes. 36 tablet 3   levothyroxine (SYNTHROID) 88 MCG tablet Take 1 tablet by mouth every morning on an empty stomach with water only on Wednesday, Thursday, Friday, Saturday.  No food or other medications for 30 minutes. 48 tablet 3   meloxicam (MOBIC) 15 MG tablet Take 15 mg by mouth daily.     neomycin-polymyxin b-dexamethasone (MAXITROL) 3.5-10000-0.1 SUSP Place 1 drop into the left eye 3 (three) times daily.     potassium chloride (KLOR-CON) 10 MEQ tablet Only take on days you take your lasix. Take 1 tablet (10 mEq total) by mouth for 1 dose on those days you take your lasix 20mg  pill. 90 tablet 3   rosuvastatin (CRESTOR) 40 MG tablet Take 1 tablet (40 mg total) by mouth every evening. For cholesterol. 90 tablet 3   spironolactone (ALDACTONE) 25 MG tablet Take 1 tablet (25 mg total) by mouth daily. 90 tablet 3   traZODone (DESYREL) 100 MG tablet TAKE 1 TABLET BY MOUTH AT BEDTIME AS NEEDED FOR SLEEP  90 tablet 2   No current facility-administered medications for this visit.     Allergies:   No known allergies   Social History:  The patient  reports that she has been smoking cigarettes. She has a 45.00 pack-year smoking history. She has never used smokeless tobacco. She reports current drug use. Drug: Marijuana. She reports that she does not drink alcohol.   Family History:   family history includes Colon cancer in her sister; Heart attack in her mother; Heart disease in her father and mother; Liver cancer in her brother; Throat cancer in her brother.    Review of Systems: Review of Systems  Constitutional: Negative.   HENT: Negative.    Respiratory: Negative.    Cardiovascular: Negative.   Gastrointestinal: Negative.  Musculoskeletal: Negative.   Neurological:  Positive for dizziness.  Psychiatric/Behavioral: Negative.    All other systems reviewed and are negative.   PHYSICAL EXAM: VS:  BP 110/68 (BP Location: Left Arm, Patient Position: Sitting, Cuff Size: Normal)    Pulse 62    Ht 5\' 7"  (1.702 m)    Wt 149 lb (67.6 kg)    BMI 23.34 kg/m  , BMI Body mass index is 23.34 kg/m. Constitutional:  oriented to person, place, and time. No distress.  HENT:  Head: Grossly normal Eyes:  no discharge. No scleral icterus.  Neck: No JVD, no carotid bruits  Cardiovascular: Regular rate and rhythm, no murmurs appreciated Pulmonary/Chest: Clear to auscultation bilaterally, no wheezes or rails Abdominal: Soft.  no distension.  no tenderness.  Musculoskeletal: Normal range of motion Neurological:  normal muscle tone. Coordination normal. No atrophy Skin: Skin warm and dry Psychiatric: normal affect, pleasant  Recent Labs: 08/03/2020: ALT 22 04/19/2021: BUN 29; Creatinine, Ser 1.67; Hemoglobin 13.6; Platelets 320; Potassium 4.9; Sodium 139 07/18/2021: TSH 1.89    Lipid Panel Lab Results  Component Value Date   CHOL 112 07/18/2021   HDL 36.40 (L) 07/18/2021   LDLCALC 39  07/18/2021   TRIG 182.0 (H) 07/18/2021     Wt Readings from Last 3 Encounters:  07/23/21 149 lb (67.6 kg)  07/09/21 150 lb (68 kg)  06/12/21 150 lb (68 kg)     ASSESSMENT AND PLAN:  CAD, multiple vessel -  Currently with no symptoms of angina. No further workup at this time. Continue current medication regimen. Smoking cessation recommended Sugar slowly improving  Mixed hyperlipidemia -  Stay on Crestor with Zetia, goal LDL less than 70 Numbers at goal  Diabetes mellitus type 2 with peripheral artery disease (HCC) -  A1c  has slowly improved Reports some labile numbers  Chronic systolic congestive heart failure ([place]) -  Will continue reduced dose of carvedilol 3.125 twice daily enalapril 10 daily Lasix 3 days a week  Essential hypertension -  Blood pressure much more stable on reduced doses  Tobacco abuse - We have encouraged her to continue to work on weaning her cigarettes and smoking cessation. She will continue to work on this and does not want any assistance with chantix.    Dizziness/possible vertigo Recommend she start meclizine, if symptoms persist referral placed for ENT evaluation Reports eyes crossing, room spinning, very remote history of ear surgery Has been going on for months no waxing and waning  COPD, moderate (HCC) - Chronic cough, smoking cessation recommended Currently stable  PAD February 2022 intervention lower extremities with Dr. Fletcher Anon Denies claudication symptoms Smoking cessation recommended   Total encounter time more than 25 minutes  Greater than 50% was spent in counseling and coordination of care with the patient   No orders of the defined types were placed in this encounter.     Signed, Esmond Plants, M.D., Ph.D. 07/23/2021  [place], [place]

## 2021-07-23 NOTE — Patient Instructions (Addendum)
We placed a referral to ENT, they will reach out to you in a couple weeks to a month for first appt.   Medication Instructions:  Please try Meclizine 25 mg pill up to every 6 hrs as needed for vertigo  If you need a refill on your cardiac medications before your next appointment, please call your pharmacy.   Lab work: No new labs needed  Testing/Procedures: No new testing needed  Follow-Up: At [facility], you and your health needs are our priority.  As part of our continuing mission to provide you with exceptional heart care, we have created designated Provider Care Teams.  These Care Teams include your primary Cardiologist (physician) and Advanced Practice Providers (APPs -  Physician Assistants and Nurse Practitioners) who all work together to provide you with the care you need, when you need it.  You will need a follow up appointment in 6 months  Providers on your designated Care Team:   Murray Hodgkins, NP Christell Faith, PA-C Cadence Kathlen Mody, [place]  COVID-19 Vaccine Information can be found at: [email or web address].uk For questions related to vaccine distribution or appointments, please email [email or web address] or call [phone].

## 2021-07-24 ENCOUNTER — Encounter: Payer: Self-pay | Admitting: Primary Care

## 2021-07-24 ENCOUNTER — Ambulatory Visit: Payer: Medicare PPO | Admitting: Cardiovascular Disease

## 2021-07-24 ENCOUNTER — Telehealth (INDEPENDENT_AMBULATORY_CARE_PROVIDER_SITE_OTHER): Payer: Medicare PPO | Admitting: Primary Care

## 2021-07-24 DIAGNOSIS — R55 Syncope and collapse: Secondary | ICD-10-CM | POA: Diagnosis not present

## 2021-07-24 DIAGNOSIS — E782 Mixed hyperlipidemia: Secondary | ICD-10-CM | POA: Diagnosis not present

## 2021-07-24 DIAGNOSIS — G629 Polyneuropathy, unspecified: Secondary | ICD-10-CM | POA: Diagnosis not present

## 2021-07-24 DIAGNOSIS — E1151 Type 2 diabetes mellitus with diabetic peripheral angiopathy without gangrene: Secondary | ICD-10-CM

## 2021-07-24 DIAGNOSIS — R42 Dizziness and giddiness: Secondary | ICD-10-CM | POA: Diagnosis not present

## 2021-07-24 DIAGNOSIS — E039 Hypothyroidism, unspecified: Secondary | ICD-10-CM | POA: Diagnosis not present

## 2021-07-24 MED ORDER — NOVOLOG FLEXPEN 100 UNIT/ML ~~LOC~~ SOPN: PEN_INJECTOR | SUBCUTANEOUS | 3 refills | Status: DC

## 2021-07-24 MED ORDER — LANTUS SOLOSTAR 100 UNIT/ML ~~LOC~~ SOPN: 30.0000 [IU] | PEN_INJECTOR | Freq: Every day | SUBCUTANEOUS | 3 refills | Status: DC

## 2021-07-24 NOTE — Assessment & Plan Note (Signed)
Follows with pain management. Continue gabapentin, she will check with her provider regarding dose increase.  Consider neurology referral.

## 2021-07-24 NOTE — Assessment & Plan Note (Signed)
Complete work up per cardiology which seems like inner ear/vertigo.  Reviewed echocardiogram and carotid duplex studies.  She has been referred for evaluation of vertigo.

## 2021-07-24 NOTE — Progress Notes (Signed)
Patient ID: Sarah Payne, female    DOB: Apr 16, 1946, 76 y.o.   MRN: [id number]  Virtual visit completed through [employer], a video enabled telemedicine application. Due to national recommendations of social distancing due to COVID-19, a virtual visit is felt to be most appropriate for this patient at this time. Reviewed limitations, risks, security and privacy concerns of performing a virtual visit and the availability of in person appointments. I also reviewed that there may be a patient responsible charge related to this service. The patient agreed to proceed.   Patient location: home Provider location: [place] at [facility], office Persons participating in this virtual visit: patient, provider   If any vitals were documented, they were collected by patient at home unless specified below.    Ht 5\' 7"  (1.702 m)    Wt 149 lb (67.6 kg)    BMI 23.34 kg/m    CC: Follow up Subjective:   HPI: Sarah Payne is a 76 y.o. female with a significant medical history including uncontrolled type 2 diabetes, hypertension, takotsubo cardiomyopathy, CAD, COPD, tobacco abuse, GERD, hypothyroidism, OSA, depression who presents today for follow up of diabetes, hypothyroidism, hyperlipidemia.  1) Type 2 Diabetes:  Current medications include: Lantus 30 units BID, but she is injecting 30 units HS only. Novolog 15-16 units TID with meals. She is injecting 15-16 units before lunch only.   She is checking her blood glucose 5-6 times daily and is getting readings ranging 60's-130's. Mostly 120's-130's, dropping once daily in the 60's-70's 2 hours after lunch and sometimes at bedtime. She injects her Lantus at night.   Last A1C: 8.4 in July 2022, 8.20 June 2021 Last Eye Exam: UTD Last Foot Exam: Due Pneumonia Vaccination: 2020 Urine Microalbumin: None. Managed on ACE-I Statin: rosuvastatin   Dietary changes since last visit: Significant reduction in [employer], uses sparingly. Endorses a fair diet.    Continues to struggle with chronic neuropathy. Is following pain management and is managed on gabapentin 300 mg TID. She's not discussed her neuropathy with pain management recently. She has not seen neurology.    2) Hyperlipidemia: Currently managed on rosuvastatin 40 mg. Recent lipid panel with LDL of 39.   3) Hypothyroidism: Managed on TSH from December 2022 of 1.89. she is managed on levothyroxine 100 mcg three days weekly and levothyroxine 88 mcg four days weekly.   She is taking every morning on an empty stomach with water only.   No food or other medications for 30 minutes.   No heartburn medication, iron pills, calcium, vitamin D, or magnesium pills within four hours of taking levothyroxine.    4) Postural Syncope: Chronic and intermittent for the last three months, is following with cardiology who suspects inner ear/vertigo cause. Initially it was suspected to be secondary to her medication regimen. She's undergone adjustments to her blood pressure medications but symptoms have persisted.   She undergone echocardiogram, carotid duplex, heart monitor evaluation, was notified that the cause of her syncope was not cardiac.  She has been referred for evaluation of inner ear.      Relevant past medical, surgical, family and social history reviewed and updated as indicated. Interim medical history since our last visit reviewed. Allergies and medications reviewed and updated. Outpatient Medications Prior to Visit  Medication Sig Dispense Refill   albuterol (VENTOLIN HFA) 108 (90 Base) MCG/ACT inhaler Inhale 2 puffs into the lungs every 6 (six) hours as needed for wheezing or shortness of breath. 8 g 0  amitriptyline (ELAVIL) 50 MG tablet Take 1 tablet (50 mg total) by mouth at bedtime as needed for sleep. 30 tablet 5   carvedilol (COREG) 3.125 MG tablet Take 1 tablet (3.125 mg total) by mouth 2 (two) times daily. 180 tablet 3   clopidogrel (PLAVIX) 75 MG tablet Take 1 tablet (75  mg total) by mouth daily. 90 tablet 3   Continuous Blood Gluc Receiver (FREESTYLE LIBRE 2 READER) DEVI Use with sensor to check blood sugars 6 times daily. 1 each 0   Continuous Blood Gluc Sensor (FREESTYLE LIBRE 2 SENSOR) MISC Use to check blood sugars 6 times daily. 3 each 3   enalapril (VASOTEC) 10 MG tablet TAKE 1 TABLET BY MOUTH ONCE DAILY 90 tablet 3   ezetimibe (ZETIA) 10 MG tablet Take 1 tablet (10 mg total) by mouth daily. For cholesterol. 90 tablet 3   furosemide (LASIX) 20 MG tablet Take one tab only on Monday, Wednesday, & Fridays 45 tablet 3   gabapentin (NEURONTIN) 300 MG capsule Take 1 capsule (300 mg total) by mouth 3 (three) times daily. 90 capsule 5   glucose blood (FREESTYLE LITE) test strip Test up to four times a day as needed. DX 100 each 0   Insulin Pen Needle (INSUPEN PEN NEEDLES) 32G X 4 MM MISC Use to inject insulin 4 times daily. 250 each 1   isosorbide mononitrate (IMDUR) 30 MG 24 hr tablet Take 1 tablet (30 mg) by mouth once daily at bedtime 90 tablet 0   levothyroxine (SYNTHROID) 100 MCG tablet Take 1 tablet by mouth on an empty stomach with water only on Sunday, Monday, and Tuesday. No food or other medications for 30 minutes. 36 tablet 3   levothyroxine (SYNTHROID) 88 MCG tablet Take 1 tablet by mouth every morning on an empty stomach with water only on Wednesday, Thursday, Friday, Saturday.  No food or other medications for 30 minutes. 48 tablet 3   meclizine (ANTIVERT) 25 MG tablet Take one tab up to every 6 hrs as needed for vertigo 30 tablet 1   meloxicam (MOBIC) 15 MG tablet Take 15 mg by mouth daily.     potassium chloride (KLOR-CON) 10 MEQ tablet Only take on days you take your lasix. Take 1 tablet (10 mEq total) by mouth for 1 dose on those days you take your lasix 20mg  pill. 90 tablet 3   traZODone (DESYREL) 100 MG tablet TAKE 1 TABLET BY MOUTH AT BEDTIME AS NEEDED FOR SLEEP 90 tablet 2   insulin aspart (NOVOLOG FLEXPEN) 100 UNIT/ML FlexPen Inject 22 units  with breakfast and lunch, inject 14 units with dinner. 45 mL 3   insulin glargine (LANTUS SOLOSTAR) 100 UNIT/ML Solostar Pen Inject 60 Units into the skin at bedtime. 45 mL 3   neomycin-polymyxin b-dexamethasone (MAXITROL) 3.5-10000-0.1 SUSP Place 1 drop into the left eye 3 (three) times daily. (Patient not taking: Reported on 07/24/2021)     rosuvastatin (CRESTOR) 40 MG tablet Take 1 tablet (40 mg total) by mouth every evening. For cholesterol. (Patient not taking: Reported on 07/24/2021) 90 tablet 3   spironolactone (ALDACTONE) 25 MG tablet Take 1 tablet (25 mg total) by mouth daily. (Patient not taking: Reported on 07/24/2021) 90 tablet 3   No facility-administered medications prior to visit.     Per HPI unless specifically indicated in ROS section below Review of Systems  Eyes:  Negative for visual disturbance.  Respiratory:  Negative for shortness of breath.   Cardiovascular:  Negative for chest pain.  Neurological:  Positive for dizziness and numbness.  Objective:  Ht 5\' 7"  (1.702 m)    Wt 149 lb (67.6 kg)    BMI 23.34 kg/m   Wt Readings from Last 3 Encounters:  07/24/21 149 lb (67.6 kg)  07/23/21 149 lb (67.6 kg)  07/09/21 150 lb (68 kg)       Physical exam: General: Alert and oriented x 3, no distress, does not appear sickly  Pulmonary: Speaks in complete sentences without increased work of breathing, no cough during visit.  Psychiatric: Normal mood, thought content, and behavior.     Results for orders placed or performed in visit on 07/18/21  Hemoglobin A1c  Result Value Ref Range   Hgb A1c MFr Bld 8.1 (H) 4.6 - 6.5 %  TSH  Result Value Ref Range   TSH 1.89 0.35 - 5.50 uIU/mL  Lipid panel  Result Value Ref Range   Cholesterol 112 0 - 200 mg/dL   Triglycerides 182.0 (H) 0.0 - 149.0 mg/dL   HDL 36.40 (L) >39.00 mg/dL   VLDL 36.4 0.0 - 40.0 mg/dL   LDL Cholesterol 39 0 - 99 mg/dL   Total CHOL/HDL Ratio 3    NonHDL 75.21    Assessment & Plan:   Problem List Items  Addressed This Visit       Cardiovascular and Mediastinum   Diabetes mellitus type 2 with peripheral artery disease (HCC) (Chronic)    Improved with A1C of 8.1! She is still above goal, but glucose is dropping in the 60's-70's once daily.  Continue Lantus 30 units HS. Reduce Novolog to 10 units before lunch. Consider changing Novolog to 6 units before breakfast and lunch, but she will update.  Also consider GLP 1 in place of Novlog, unsure if she's tried this before, will review notes.   Follow up in 3 months      Relevant Medications   insulin aspart (NOVOLOG FLEXPEN) 100 UNIT/ML FlexPen   insulin glargine (LANTUS SOLOSTAR) 100 UNIT/ML Solostar Pen   Postural dizziness with near syncope    Complete work up per cardiology which seems like inner ear/vertigo.  Reviewed echocardiogram and carotid duplex studies.  She has been referred for evaluation of vertigo.         Endocrine   Hypothyroidism    Controlled on levothyroxine 100 mcg three days weekly and 88 mcg four days weekly, continue same.  Recent TSH reviewed.         Nervous and Auditory   Peripheral polyneuropathy    Follows with pain management. Continue gabapentin, she will check with her provider regarding dose increase.  Consider neurology referral.        Other   Mixed hyperlipidemia    LDL at goal on rosuvastatin 40 mg, continue same.        Meds ordered this encounter  Medications   insulin aspart (NOVOLOG FLEXPEN) 100 UNIT/ML FlexPen    Sig: Inject 10 units before lunch for diabetes.    Dispense:  45 mL    Refill:  3    Order Specific Question:   Supervising Provider    Answer:   BEDSOLE, AMY E [[id number]]   insulin glargine (LANTUS SOLOSTAR) 100 UNIT/ML Solostar Pen    Sig: Inject 30 Units into the skin at bedtime. For diabetes.    Dispense:  45 mL    Refill:  3    Order Specific Question:   Supervising Provider    Answer:   BEDSOLE, AMY E [[id number]]  No orders of the defined types were placed  in this encounter.   I discussed the assessment and treatment plan with the patient. The patient was provided an opportunity to ask questions and all were answered. The patient agreed with the plan and demonstrated an understanding of the instructions. The patient was advised to call back or seek an in-person evaluation if the symptoms worsen or if the condition fails to improve as anticipated.  Follow up plan:  Continue Lanuts 30 units at night.  Reduce Novolog to 10 units before lunch.  Notify me if your sugars continue to drop and/or if your blood sugars become higher.   Continue to work on [employer].  It was a pleasure to see you today!   [name], NP

## 2021-07-24 NOTE — Assessment & Plan Note (Signed)
LDL at goal on rosuvastatin 40 mg, continue same.

## 2021-07-24 NOTE — Assessment & Plan Note (Signed)
Controlled on levothyroxine 100 mcg three days weekly and 88 mcg four days weekly, continue same.  Recent TSH reviewed.

## 2021-07-24 NOTE — Patient Instructions (Signed)
Continue Lanuts 30 units at night.  Reduce Novolog to 10 units before lunch.  Notify me if your sugars continue to drop and/or if your blood sugars become higher.   Continue to work on [employer].  It was a pleasure to see you today!

## 2021-07-24 NOTE — Assessment & Plan Note (Signed)
Improved with A1C of 8.1! She is still above goal, but glucose is dropping in the 60's-70's once daily.  Continue Lantus 30 units HS. Reduce Novolog to 10 units before lunch. Consider changing Novolog to 6 units before breakfast and lunch, but she will update.  Also consider GLP 1 in place of Novlog, unsure if she's tried this before, will review notes.   Follow up in 3 months

## 2021-08-05 ENCOUNTER — Telehealth: Payer: Self-pay

## 2021-08-05 NOTE — Progress Notes (Addendum)
Chronic Care Management Pharmacy Assistant   Name: Sarah Payne  MRN: [id number] DOB: 05/14/1946  Reason for Encounter: CCM (General Adherence)   Recent office visits:  07/24/2021 - Alma Friendly, NP - Patient presented for virtual visit for diabetes. Change: insulin aspart (NOVOLOG FLEXPEN) 100 UNIT/ML FlexPen - inject 10 units before lunch vs. 22 units with breakfast and lunch and 14 units with dinner. Change: insulin glargine (LANTUS SOLOSTAR) 100 UNIT/ML Solostar Pen - 30 units at bedtime vs. 60 units at bedtime. Consider GLP-1. Follow up 3 months.   Recent consult visits:  07/23/2021 - Cardiology - Patient presented for 1 month follow up for Vertigo. Labs: EKG. Referral to ENT. Start: meclizine (ANTIVERT) 25 MG tablet. 07/09/2021 - Gillis Santa, MD - Pain Medicine - Patient presented for back pain. No medication changes.   Hospital visits:  None since last CCM contact  Medications: Outpatient Encounter Medications as of 08/05/2021  Medication Sig   albuterol (VENTOLIN HFA) 108 (90 Base) MCG/ACT inhaler Inhale 2 puffs into the lungs every 6 (six) hours as needed for wheezing or shortness of breath.   amitriptyline (ELAVIL) 50 MG tablet Take 1 tablet (50 mg total) by mouth at bedtime as needed for sleep.   carvedilol (COREG) 3.125 MG tablet Take 1 tablet (3.125 mg total) by mouth 2 (two) times daily.   clopidogrel (PLAVIX) 75 MG tablet Take 1 tablet (75 mg total) by mouth daily.   Continuous Blood Gluc Receiver (FREESTYLE LIBRE 2 READER) DEVI Use with sensor to check blood sugars 6 times daily.   Continuous Blood Gluc Sensor (FREESTYLE LIBRE 2 SENSOR) MISC Use to check blood sugars 6 times daily.   enalapril (VASOTEC) 10 MG tablet TAKE 1 TABLET BY MOUTH ONCE DAILY   ezetimibe (ZETIA) 10 MG tablet Take 1 tablet (10 mg total) by mouth daily. For cholesterol.   furosemide (LASIX) 20 MG tablet Take one tab only on Monday, Wednesday, & Fridays   gabapentin (NEURONTIN) 300 MG capsule  Take 1 capsule (300 mg total) by mouth 3 (three) times daily.   glucose blood (FREESTYLE LITE) test strip Test up to four times a day as needed. DX   insulin aspart (NOVOLOG FLEXPEN) 100 UNIT/ML FlexPen Inject 10 units before lunch for diabetes.   insulin glargine (LANTUS SOLOSTAR) 100 UNIT/ML Solostar Pen Inject 30 Units into the skin at bedtime. For diabetes.   Insulin Pen Needle (INSUPEN PEN NEEDLES) 32G X 4 MM MISC Use to inject insulin 4 times daily.   isosorbide mononitrate (IMDUR) 30 MG 24 hr tablet Take 1 tablet (30 mg) by mouth once daily at bedtime   levothyroxine (SYNTHROID) 100 MCG tablet Take 1 tablet by mouth on an empty stomach with water only on Sunday, Monday, and Tuesday. No food or other medications for 30 minutes.   levothyroxine (SYNTHROID) 88 MCG tablet Take 1 tablet by mouth every morning on an empty stomach with water only on Wednesday, Thursday, Friday, Saturday.  No food or other medications for 30 minutes.   meclizine (ANTIVERT) 25 MG tablet Take one tab up to every 6 hrs as needed for vertigo   meloxicam (MOBIC) 15 MG tablet Take 15 mg by mouth daily.   neomycin-polymyxin b-dexamethasone (MAXITROL) 3.5-10000-0.1 SUSP Place 1 drop into the left eye 3 (three) times daily. (Patient not taking: Reported on 07/24/2021)   potassium chloride (KLOR-CON) 10 MEQ tablet Only take on days you take your lasix. Take 1 tablet (10 mEq total) by mouth for 1 dose  on those days you take your lasix 20mg  pill.   rosuvastatin (CRESTOR) 40 MG tablet Take 1 tablet (40 mg total) by mouth every evening. For cholesterol. (Patient not taking: Reported on 07/24/2021)   spironolactone (ALDACTONE) 25 MG tablet Take 1 tablet (25 mg total) by mouth daily. (Patient not taking: Reported on 07/24/2021)   traZODone (DESYREL) 100 MG tablet TAKE 1 TABLET BY MOUTH AT BEDTIME AS NEEDED FOR SLEEP   No facility-administered encounter medications on file as of 08/05/2021.   Contacted Sarah Payne on 08/06/2021 for  general disease state and medication adherence call.   Patient is more than 5 days past due for refill on the following medications per chart history:  [facility] Medications: Medication Name/mg Last Fill Days Supply Lantus Solostar 100 units 08/08/2020 25   Novolog 100 units  11/02/2019 33 Rosuvastatin 40 mg  No fill date noted  What concerns do you have about your medications? Patient does not have any concerns at this time.   The patient denies side effects with their medications.   How often do you forget or accidentally miss a dose? Never  Do you use a pillbox? Yes  Are you having any problems getting your medications from your pharmacy? No  Has the cost of your medications been a concern? No  Since last visit with CPP, the following interventions have been made. No recent interventions.  The patient has not had an ED visit since last contact.   The patient reports the following problems with their health. Patient has a stomach virus that started yesterday. She has diarrhea. Patient is not taking anything to manage her symptoms, but feel she is getting better.   Patient denies concerns or questions for [name], PharmD at this time.   Care Gaps: Annual wellness visit in last year? Yes 02/06/2021 Most Recent BP reading: 110/68 on 07/23/2021  If Diabetic: Most recent A1C reading: 8.1 on 07/18/2021 Last eye exam / retinopathy screening: Up to date Last diabetic foot exam: 02/01/2020  Upcoming appointments: No appointments scheduled within the next 30 days. PCP appointment on 10/22/2021 PCP appointment on 02/18/2022 for Physical  [name], CPP notified  [name], [place] Assistant [phone]  I have reviewed the care management and care coordination activities outlined in this encounter and I am certifying that I agree with the content of this note. No further action required.  [name], PharmD Clinical Pharmacist [facility]  at [facility] [phone]

## 2021-09-02 ENCOUNTER — Telehealth: Payer: Self-pay

## 2021-09-02 NOTE — Progress Notes (Signed)
Chronic Care Management Pharmacy Assistant   Name: MURREL FREET  MRN: [id number] DOB: 1945/10/21  Reason for Encounter: CCM (Appointment Reminder)   Medications: Outpatient Encounter Medications as of 09/02/2021  Medication Sig   albuterol (VENTOLIN HFA) 108 (90 Base) MCG/ACT inhaler Inhale 2 puffs into the lungs every 6 (six) hours as needed for wheezing or shortness of breath.   amitriptyline (ELAVIL) 50 MG tablet Take 1 tablet (50 mg total) by mouth at bedtime as needed for sleep.   carvedilol (COREG) 3.125 MG tablet Take 1 tablet (3.125 mg total) by mouth 2 (two) times daily.   clopidogrel (PLAVIX) 75 MG tablet Take 1 tablet (75 mg total) by mouth daily.   Continuous Blood Gluc Receiver (FREESTYLE LIBRE 2 READER) DEVI Use with sensor to check blood sugars 6 times daily.   Continuous Blood Gluc Sensor (FREESTYLE LIBRE 2 SENSOR) MISC Use to check blood sugars 6 times daily.   enalapril (VASOTEC) 10 MG tablet TAKE 1 TABLET BY MOUTH ONCE DAILY   ezetimibe (ZETIA) 10 MG tablet Take 1 tablet (10 mg total) by mouth daily. For cholesterol.   furosemide (LASIX) 20 MG tablet Take one tab only on Monday, Wednesday, & Fridays   gabapentin (NEURONTIN) 300 MG capsule Take 1 capsule (300 mg total) by mouth 3 (three) times daily.   glucose blood (FREESTYLE LITE) test strip Test up to four times a day as needed. DX   insulin aspart (NOVOLOG FLEXPEN) 100 UNIT/ML FlexPen Inject 10 units before lunch for diabetes.   insulin glargine (LANTUS SOLOSTAR) 100 UNIT/ML Solostar Pen Inject 30 Units into the skin at bedtime. For diabetes.   Insulin Pen Needle (INSUPEN PEN NEEDLES) 32G X 4 MM MISC Use to inject insulin 4 times daily.   isosorbide mononitrate (IMDUR) 30 MG 24 hr tablet Take 1 tablet (30 mg) by mouth once daily at bedtime   levothyroxine (SYNTHROID) 100 MCG tablet Take 1 tablet by mouth on an empty stomach with water only on Sunday, Monday, and Tuesday. No food or other medications for 30 minutes.    levothyroxine (SYNTHROID) 88 MCG tablet Take 1 tablet by mouth every morning on an empty stomach with water only on Wednesday, Thursday, Friday, Saturday.  No food or other medications for 30 minutes.   meclizine (ANTIVERT) 25 MG tablet Take one tab up to every 6 hrs as needed for vertigo   meloxicam (MOBIC) 15 MG tablet Take 15 mg by mouth daily.   neomycin-polymyxin b-dexamethasone (MAXITROL) 3.5-10000-0.1 SUSP Place 1 drop into the left eye 3 (three) times daily. (Patient not taking: Reported on 07/24/2021)   potassium chloride (KLOR-CON) 10 MEQ tablet Only take on days you take your lasix. Take 1 tablet (10 mEq total) by mouth for 1 dose on those days you take your lasix 20mg  pill.   rosuvastatin (CRESTOR) 40 MG tablet Take 1 tablet (40 mg total) by mouth every evening. For cholesterol. (Patient not taking: Reported on 07/24/2021)   spironolactone (ALDACTONE) 25 MG tablet Take 1 tablet (25 mg total) by mouth daily. (Patient not taking: Reported on 07/24/2021)   traZODone (DESYREL) 100 MG tablet TAKE 1 TABLET BY MOUTH AT BEDTIME AS NEEDED FOR SLEEP   No facility-administered encounter medications on file as of 09/02/2021.   REJOICE HEATWOLE was contacted to remind of upcoming telephone visit with Charlene Brooke on 09/05/2021 at 11:00 am. Patient was reminded to have all medications, supplements and any blood glucose and blood pressure readings available for review at  appointment. If unable to reach, a voicemail was left for patient.   Star Rating Drugs: Medication:   Last Fill: Day Supply Rosuvastatin 40 mg                No fill date noted  Charlene Brooke, CPP notified  [name], [place]  Time Spent: 10 Minutes

## 2021-09-05 ENCOUNTER — Other Ambulatory Visit: Payer: Self-pay

## 2021-09-05 ENCOUNTER — Ambulatory Visit (INDEPENDENT_AMBULATORY_CARE_PROVIDER_SITE_OTHER): Payer: Medicare PPO | Admitting: Pharmacist

## 2021-09-05 DIAGNOSIS — I25118 Atherosclerotic heart disease of native coronary artery with other forms of angina pectoris: Secondary | ICD-10-CM

## 2021-09-05 DIAGNOSIS — I5022 Chronic systolic (congestive) heart failure: Secondary | ICD-10-CM

## 2021-09-05 DIAGNOSIS — F32A Depression, unspecified: Secondary | ICD-10-CM

## 2021-09-05 DIAGNOSIS — E782 Mixed hyperlipidemia: Secondary | ICD-10-CM

## 2021-09-05 DIAGNOSIS — I10 Essential (primary) hypertension: Secondary | ICD-10-CM

## 2021-09-05 DIAGNOSIS — E1151 Type 2 diabetes mellitus with diabetic peripheral angiopathy without gangrene: Secondary | ICD-10-CM

## 2021-09-05 NOTE — Progress Notes (Signed)
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 She reports husband has PTSD and has not been able to have nurses/caregivers in the home before. Pt has son in [place] who helps occasionally and daughter in [place] who calls to check in often -PHQ-9 today was 15 (moderately severe depression), patient endorses depressed mood, poor sleep, early-morning awakening, poor appetite and frustration with caring for husband. She declined psychiatry/counseling referral stating "talking about it won't help" but is open to starting a new medication -Pt did not bring Freestyle Libre reader to office so unable to verify readings; however pt reports several episodes of low BG in 50s-60s, usually after lunchtime dose of Novolog (15 units) or overnight. She only knows about these episodes because of CGM alarm - she does not have symptoms of low sugars anymore. Of not she only takes Novolog twice daily with lunch and dinner. -Cardiology referred pt to ENT in January for dizziness/vertigo, pt has not heard from them. Per chart referral was to [facility] -Pt requests refill of Clobetasol 0.05% ointment   Recommendations:  -Recommend adding mirtazapine 7.5 mg daily at bedtime (target sleep, appetite and relatively safe to use with amitriptyline at low dose). Discontinue trazodone when starting mirtazapine. (See phone note) -Advised to reduce Novolog to 10 units with lunch and dinner. Pt will bring Freestyle  Libre meter to next CCM appt to download AGP. Can consider GLP-1 (Ozempic preferred at [facility]) in future to replace Novolog -Advised pt to call [facility] [phone] for appt -Coordinate with PCP for refill clobetasol ointment  Plan:  -Margaret will call patient 2 weeks for BG log -Pharmacist follow up televisit scheduled for 1 month -PCP 33-month f/u 10/22/21    Subjective: Sarah MONTERROSOis an 76y.o. year old female who is a primary patient of CPleas Koch NP.  The CCM team was consulted for assistance with disease management and care coordination needs.    Engaged with patient by telephone for follow up visit in response to provider referral for pharmacy case management and/or care coordination services.   Consent to Services:  The patient was given information about Chronic Care Management services, agreed to services, and gave verbal consent prior to initiation of services.  Please see initial visit note for detailed documentation.   Patient Care Team: CPleas Koch NP as PCP - General (Internal Medicine) GRockey SituTKathlene November MD as PCP - Cardiology (Cardiology) GMinna Merritts MD (Cardiology) KInda Castle MD (Inactive) as Consulting Physician (Gastroenterology) YMeade Maw MD as Consulting Physician (Neurosurgery) GPhilemon Kingdom MD as Consulting Physician (Internal Medicine) GMinna Merritts MD as Consulting Physician (Cardiology) ADebbora Dus RWestlake Ophthalmology Asc LPas Pharmacist (Pharmacist) PBirder Robson MD as Referring Physician (Ophthalmology)  Recent office visits: 07/24/21 NP KAlma FriendlyVV: f/u DM, dizziness. Reduce Novolog to 10 units at lunch. Considering GLP-1 in place of Novolog. F/U 3 months.   Recent consult visits:  06/12/21 - Cardiology - Reduce Lasix to 20 mg 3 days/week. Do not resume until 11/28. Reduce enalapril to 10 mg daily. Reduce Coreg to 3.125 mg BID.   Hospital visits: None in previous 6 months  Objective:  Lab Results  Component Value Date   CREATININE 1.67 (H) 04/19/2021   BUN 29 (H) 04/19/2021   GFR 44.80 (L) 08/03/2020   GFRNONAA  43 (L) 01/08/2021   GFRAA 55 (L) 08/30/2020   NA 139 04/19/2021   K 4.9 04/19/2021   CALCIUM 9.5 04/19/2021   CO2 21 04/19/2021    Lab Results  Component Value Date/Time   HGBA1C 8.1 (H) 07/18/2021 02:43 PM   HGBA1C 8.4 (A)  02/15/2021 09:17 AM   HGBA1C 8.5 (A) 11/14/2020 11:14 AM   HGBA1C 10.3 (H) 12/24/2018 12:27 PM   HGBA1C 10.9 (H) 08/02/2014 04:08 AM   HGBA1C 7.3 (H) 08/19/2013 12:19 PM   GFR 44.80 (L) 08/03/2020 10:05 AM   GFR 42.32 (L) 06/04/2020 09:29 AM   MICROALBUR 1.2 05/30/2013 04:48 PM    Last diabetic Eye exam:  Lab Results  Component Value Date/Time   HMDIABEYEEXA No Retinopathy 10/10/2020 12:00 AM    Last diabetic Foot exam:  02/01/20 with PCP   Lab Results  Component Value Date   CHOL 112 07/18/2021   HDL 36.40 (L) 07/18/2021   LDLCALC 39 07/18/2021   LDLDIRECT 143.0 12/24/2018   TRIG 182.0 (H) 07/18/2021   CHOLHDL 3 07/18/2021    Hepatic Function Latest Ref Rng & Units 08/03/2020 08/29/2019 12/24/2018  Total Protein 6.0 - 8.3 g/dL 6.3 6.3 7.1  Albumin 3.5 - 5.2 g/dL 4.2 4.1 4.3  AST 0 - 37 U/L 23 10 12   ALT 0 - 35 U/L 22 14 10   Alk Phosphatase 39 - 117 U/L 68 91 80  Total Bilirubin 0.2 - 1.2 mg/dL 0.5 0.4 0.4  Bilirubin, Direct 0.0 - 0.3 mg/dL - - -    Lab Results  Component Value Date/Time   TSH 1.89 07/18/2021 02:43 PM   TSH 1.140 01/04/2021 12:11 PM   FREET4 1.34 01/04/2021 12:11 PM   FREET4 0.95 01/18/2018 10:40 AM    CBC Latest Ref Rng & Units 04/19/2021 04/12/2021 08/30/2020  WBC 3.4 - 10.8 x10E3/uL 9.4 9.7 8.4  Hemoglobin 11.1 - 15.9 g/dL 13.6 13.6 13.8  Hematocrit 34.0 - 46.6 % 39.6 40.0 41.5  Platelets 150 - 450 x10E3/uL 320 380 242    Lab Results  Component Value Date/Time   VD25OH 41 02/25/2013 11:37 AM    Clinical ASCVD: Yes  The ASCVD Risk score (Arnett DK, et al., 2019) failed to calculate for the following reasons:   The valid total cholesterol range is 130 to 320 mg/dL    Depression screen [facility] 2/9 09/05/2021 02/06/2021 04/02/2020  Decreased Interest 3 0 3  Down, Depressed, Hopeless 3 0 3  PHQ - 2 Score 6 0 6  Altered sleeping 3 0 3  Tired, decreased energy 3 0 3  Change in appetite 3 0 0  Feeling bad or failure about yourself  0 0 0  Trouble  concentrating 0 0 0  Moving slowly or fidgety/restless 0 0 0  Suicidal thoughts 0 0 0  PHQ-9 Score 15 0 12  Difficult doing work/chores Not difficult at all Not difficult at all Extremely dIfficult  Some recent data might be hidden    Social History   Tobacco Use  Smoking Status Every Day   Packs/day: 1.00   Years: 45.00   Pack years: 45.00   Types: Cigarettes  Smokeless Tobacco Never  Tobacco Comments   Has cut back, trying to quit.    BP Readings from Last 3 Encounters:  07/23/21 110/68  07/09/21 (!) 138/95  06/12/21 (!) 97/56   Pulse Readings from Last 3 Encounters:  07/23/21 62  07/09/21 82  06/12/21 73   Wt Readings from Last 3 Encounters:  07/24/21 149 lb (67.6 kg)  07/23/21 149 lb (67.6 kg)  07/09/21 150 lb (68 kg)   BMI Readings from Last 3 Encounters:  07/24/21 23.34 kg/m  07/23/21 23.34 kg/m  07/09/21 23.49 kg/m     Assessment/Interventions: Review of patient past medical history, allergies, medications, health status, including review of consultants reports, laboratory and other test data, was performed as part of comprehensive evaluation and provision of chronic care management services.   SDOH:  (Social Determinants of Health) assessments and interventions performed: Yes SDOH Interventions    Flowsheet Row Most Recent Value  SDOH Interventions   Depression Interventions/Treatment  Medication  [Pt declined pyschiatry or counseling referral, reports "talking won't help"]        CCM Care Plan  Allergies  Allergen Reactions   Metformin And Related Diarrhea   No Known Allergies     Medications Reviewed Today     Reviewed by Charlton Haws, [facility] (Pharmacist) on 09/09/21 at [phone]  Med List Status: <None>   Medication Order Taking? Sig Documenting Provider Last Dose Status Informant  albuterol (VENTOLIN HFA) 108 (90 Base) MCG/ACT inhaler [id number] Yes Inhale 2 puffs into the lungs every 6 (six) hours as needed for wheezing or shortness of  breath. Minna Merritts, MD Taking Active Self  amitriptyline (ELAVIL) 50 MG tablet [id number] Yes Take 1 tablet (50 mg total) by mouth at bedtime as needed for sleep. Gillis Santa, MD Taking Active   carvedilol (COREG) 3.125 MG tablet [id number] Yes Take 1 tablet (3.125 mg total) by mouth 2 (two) times daily. Minna Merritts, MD Taking Active   clobetasol ointment (TEMOVATE) 0.05 % [id number] Yes Apply 1 application topically 2 (two) times daily. [provider] Taking Active   clopidogrel (PLAVIX) 75 MG tablet [id number] Yes Take 1 tablet (75 mg total) by mouth daily. [name] D, PA-C Taking Active   Continuous Blood Gluc Receiver (FREESTYLE LIBRE 2 READER) DEVI [id number] Yes Use with sensor to check blood sugars 6 times daily. [name], NP Taking Active   Continuous Blood Gluc Sensor (FREESTYLE LIBRE 2 SENSOR) [place] [id number] Yes Use to check blood sugars 6 times daily. [name], NP Taking Active   enalapril (VASOTEC) 10 MG tablet [id number] Yes TAKE 1 TABLET BY MOUTH ONCE DAILY Gollan, Kathlene November, MD Taking Active   ezetimibe (ZETIA) 10 MG tablet [id number] Yes Take 1 tablet (10 mg total) by mouth daily. For cholesterol. [name] D, PA-C Taking Active   furosemide (LASIX) 20 MG tablet [id number] Yes Take one tab only on Monday, Wednesday, & Fridays Minna Merritts, MD Taking Active   gabapentin (NEURONTIN) 300 MG capsule [id number] Yes Take 1 capsule (300 mg total) by mouth 3 (three) times daily. Gillis Santa, MD Taking Active   glucose blood (FREESTYLE LITE) test strip [id number] Yes Test up to four times a day as needed. DX [name], NP Taking Active   insulin aspart (NOVOLOG FLEXPEN) 100 UNIT/ML FlexPen [id number] Yes Inject 10 units before lunch for diabetes. [name], NP Taking Active   insulin glargine (LANTUS SOLOSTAR) 100 UNIT/ML Solostar Pen [id number] Yes Inject 30 Units into the skin at bedtime. For diabetes. [name], NP Taking Active   Insulin Pen Needle (INSUPEN PEN NEEDLES) 32G X 4 MM MISC [id number] Yes Use to inject insulin 4 times daily. [name], NP Taking Active Self  isosorbide mononitrate (IMDUR) 30 MG 24 hr tablet [id number] Yes Take 1 tablet (30 mg) by  mouth once daily at bedtime Minna Merritts, MD Taking Active   levothyroxine (SYNTHROID) 100 MCG tablet [id number] Yes Take 1 tablet by mouth on an empty stomach with water only on Sunday, Monday, and Tuesday. No food or other medications for 30 minutes. [name], NP Taking Active   levothyroxine (SYNTHROID) 88 MCG tablet [id number] Yes Take 1 tablet by mouth every morning on an empty stomach with water only on Wednesday, Thursday, Friday, Saturday.  No food or other medications for 30 minutes. [name], NP Taking Active   meclizine (ANTIVERT) 25 MG tablet [id number] Yes Take one tab up to every 6 hrs as needed for vertigo Minna Merritts, MD Taking Active   meloxicam (MOBIC) 15 MG tablet [id number] Yes Take 15 mg by mouth daily. [provider] Taking Active   neomycin-polymyxin b-dexamethasone (MAXITROL) 3.5-10000-0.1 SUSP [id number] Yes Place 1 drop into the left eye 3 (three) times daily. [provider] Taking Active Self  potassium chloride (KLOR-CON) 10 MEQ tablet [id number] Yes Only take on days you take your lasix. Take 1 tablet (10 mEq total) by mouth for 1 dose on those days you take your lasix 13m pill. VMarrianne MoodD, PA-C Taking Active   rosuvastatin (CRESTOR) 40 MG tablet [id number] Take 1 tablet (40 mg total) by mouth every evening. For cholesterol. VMarrianne MoodD, PA-C Taking Active   spironolactone (ALDACTONE) 25 MG tablet [id number] Take 1 tablet (25 mg total) by mouth daily. VMarrianne MoodD, PA-C Taking Active   traZODone (DESYREL) 100 MG tablet [id number] TAKE 1 TABLET BY MOUTH AT BEDTIME AS NEEDED FOR SLEEP CPleas Koch NP Taking Active   Med List Note (Dewayne Shorter RN 07/09/21 1059): 02-11-18 Message sent to Dr. GRockey Situin Epic for permission to stop Plavix 7 days. DW Mr 01-05-2022            Patient Active Problem List   Diagnosis Date Noted   Preventative health care 02/15/2021   Abdominal bloating 03/22/2019   Vaginal burning 03/22/2019   Foul smelling urine 03/22/2019   Acute pain of left shoulder 01/17/2019   Chronic musculoskeletal pain 11/15/2018   Atherosclerosis of native coronary artery of native heart with stable angina pectoris ([place] 05/17/2018   Chronic neck pain 02/10/2018   Chronic pain of right upper extremity 02/10/2018   Cervical spondylosis 02/10/2018   Neck pain 01/19/2018   Right shoulder pain 01/19/2018   Chronic pain syndrome 01/19/2018   Lumbar degenerative disc disease 09/29/2017   Lumbar spondylosis with myelopathy 09/29/2017   Chronic bilateral back pain 05/19/2017   Insomnia 01/12/2017   Cannabis use disorder, mild, abuse 09/03/2016   Cognitive changes 09/03/2016   Takotsubo cardiomyopathy 01/02/2016   Frequent PVCs 01/02/2016   HTN (hypertension) 09/20/2015   Benign neoplasm of colon 11/02/2014   Internal hemorrhoids 11/02/2014   Adjustment disorder with mixed anxiety and depressed mood 08/16/2014   Esophageal reflux 05/23/2014   Diverticulosis of colon without hemorrhage 05/17/2014   Peripheral polyneuropathy 05/05/2014   Tobacco dependence 05/05/2014   COPD, moderate ([place] 03/29/2013   Noncompliance with diabetes treatment 02/27/2013   Obstructive sleep apnea 016/38/4665  Chronic systolic congestive heart failure ([place] 199/35/7017  Lichen sclerosus et atrophicus 05/20/2012   Bronchitis, chronic obstructive ([place] 05/20/2012   Diabetes mellitus type 2 with peripheral artery disease ([place] 08/28/2011   History of lumbar fusion 06/29/2011   Depression 06/24/2011   Hypothyroidism 05/03/2011   CAD, multiple vessel 02/24/2011  Tobacco abuse 02/24/2011   Mixed hyperlipidemia 02/24/2011   Postural  dizziness with near syncope 02/24/2011    Immunization History  Administered Date(s) Administered   Fluad Quad(high Dose 65+) 04/02/2020   Influenza Split 05/20/2012   Influenza, High Dose Seasonal PF 04/17/2016, 04/17/2017, 05/04/2018   Influenza, Seasonal, Injecte, Preservative Fre 05/07/2006   Influenza,inj,Quad PF,6+ Mos 04/20/2013, 04/26/2014, 06/01/2015, 04/25/2019   PFIZER(Purple Top)SARS-COV-2 Vaccination 09/15/2019, 10/11/2019, 06/05/2020   Pneumococcal Conjugate-13 04/26/2014   Pneumococcal Polysaccharide-23 03/28/2013, 04/25/2019   Tdap 03/28/2013    Conditions to be addressed/monitored:  Hypertension, Hyperlipidemia, Diabetes, Heart Failure, Coronary Artery Disease, Chronic Kidney Disease, and Depression  Care Plan : [place]  Updates made by Charlton Haws, [place] since 09/09/2021 12:00 AM     Problem: Hypertension, Hyperlipidemia, Diabetes, Heart Failure, Coronary Artery Disease, Chronic Kidney Disease, and Depression   Priority: High     Long-Range Goal: Disease Management   Start Date: 09/26/2020  Expected End Date: 09/09/2022  This Visit's Progress: Not on track  Recent Progress: Not on track  Priority: High  Note:   Current Barriers:  Unable to achieve control of diabetes Caregiver stress  Pharmacist Clinical Goal(s):  Patient will achieve control of diabetes and blood pressure as evidenced by home BG /BP log through collaboration with PharmD and provider.   Interventions: 1:1 collaboration with [name], NP regarding development and update of comprehensive plan of care as evidenced by provider attestation and co-signature Inter-disciplinary care team collaboration (see longitudinal plan of care) Comprehensive medication review performed; medication list updated in electronic medical record  Hypertension / Heart Failure (BP goal <130/80) -Controlled - BP at goal per clinic readings; pt endorses dizziness and falls since summer  2022, this has been evaluated by cardiology and orthostasis was ruled out, they believe it is vertigo and have referred to ENT, per pt has not heard anything from ENT and it has been ~6 weeks since referral -Last EF 55% (03/2021), previously 25-30% in 2017 -Current home BP readings: n/a -Current treatment: Enalapril 10 mg daily - Appropriate, Effective, Safe, Accessible Carvedilol 3.185m BID - Appropriate, Effective, Safe, Accessible Isosorbide mononitrate 30 mg daily -Appropriate, Effective, Safe, Accessible Furosemide 20 mg 3x weekly - Appropriate, Effective, Safe, Accessible Spironolactone 25 mg daily - Appropriate, Effective, Safe, Accessible -Medications previously tried: none reported -Counseled to monitor BP at home with periodically -[place], advised pt to call [phone] for appt -Recommended to continue current medication  Hyperlipidemia / CAD (LDL goal < 70) -Controlled - LDL 39; pt endorses compliance with statin -Hx NSTEMI 2016; Hx PAD -Current treatment: Rosuvastatin 40 mg daily - Appropriate, Effective, Safe, Accessible Ezetimibe 10 mg daily - Appropriate, Effective, Safe, Accessible -Medications previously tried: none -Recommended to continue current medication  Diabetes (A1c goal <7%) -Uncontrolled, improving - A1c improved from >10 to 8.1% recently; she still has frequent lows; [facility] so unable to verify exact readings, but she reports lows 50s-60s several times a week, usually occurring after lunchtime Novolog dose or overnight, she treats lows with Pepsi. She reports she is no longer aware of hypoglycemia, she only knows about episodes due to CGM alarm -Of note pt reports appetite has been poor lately; she does not take Novolog if she does not eat -Current medications: Novolog - 15 units with lunch and dinner - Appropriate, Effective, Query Safe, Accessible Lantus Solostar - 30 units AM and PM  -Appropriate, Effective, Query Safe,  Accessible Freestyle Libre 2  -Medications previously tried: glipizide, Januvia, metformin (diarrhea), Trulicity (cost) -Current home glucose readings: She uses the [employer] 2 system with reader. She has an alarm when it reaches 70. She scans about 6-7/day. She does not have reader or BG log with her today. -Current meal patterns: She reports she has cut out all "sweets". Discussed importance of eating 3 meals per day and keeping carbohydrate levels steady due to her tendency for low BG. -Would consider GLP-1 agonist instead of Novolog at some point given her tendency for low sugars and hypoglycemia unawareness; cost has been a barrier in the past but she gets meds through [facility] is preferred -Recommended to reduce Novolog to 10 units w/ meals due to frequent lows; consider Ozempic instead of Novolog in future to optimize control  Depression/Anxiety (Goal: manage symptoms) -Uncontrolled - 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 for past ~3 years after his heart attack; he can walk but is mostly bed/chair-bound; she reports significant depressed mood, hopelessness, poor appetite, poor sleep; her 2 children do occasionally help which she is grateful for; she has been unable to use home health nurses/aids because her husband has PTSD and is unable to tolerate strangers in his home -She typically goes to bed at 10-11 pm, and wakes at 3-4 AM -PHQ9: 15 (08/2021 - during this visit) - moderate/severe depression -Connected with PCP for mental health support -Current treatment: Amitriptyline 50 mg HS - Appropriate, Query Effective Trazodone 100 mg HS PRN - Appropriate, Query Effective -Medications previously tried/failed: duloxetine, escitalopram  -Educated on Benefits of cognitive-behavioral therapy with or without medication at length, pt could benefit from individual or group therapy however she declined referral today, stating "talking about it won't  help" -Discussed caregiver stress/burnout, her situation is complicated by her husband's PTSD so only family/friends can help her -Recommend trial of mirtazapine 7.5 mg at bedtime; discontinue trazodone, can continue amitriptyline; monitor closely for side effects  Health Maintenance -Vaccine gaps: Shingrix, covid booster -Pt has CKD stage 3b; she is taking meloxicam - did not address today, would recommend to discontinue this at follow up -Pt requested refill for clobetasol 0.05% ointment (showed empty tube during visit) - she uses this infrequently PRN for rash that flares up occasionally   Patient Goals/Self-Care Activities Patient will:  - take medications as prescribed as evidenced by patient report and record review focus on medication adherence by routine check glucose via CGM, document, and provide at future appointments -Call [facility] for appt -Reduce Novolog to 10 units with meals -Consider counseling for caregiver stress/burnout     Medication Assistance: None required.  Patient affirms current coverage meets needs.  Care gaps: Foot exam (due 01/31/21)  Star Rating Drugs:  Medication:                Last Fill:         Day Supply Rosuvastatin 40 mg    04/16/2021      90 - PDC inaccurate Enalapril 20 mg           04/16/2021      90 - PDC inaccurate                              Reports medication compliance. Most meds from [place] at no cost.   Patient's preferred pharmacy is:  [facility] [address] [place] - [place] [address] [place] [address] Phone:  [phone] Fax: [phone]  [place], [place] [address] Phone: [phone] Fax: [phone]  [facility] [place], [place] - [address] [place] [address] Phone: [phone] Fax: [phone]  Care Plan and Follow Up Patient Decision:  Patient agrees to Care Plan and  Follow-up.  Follow Up Plan: Face to Face appointment with care management team member scheduled for: 1 month The patient has been provided with contact information for the care management team and has been advised to call with any health related questions or concerns.   Charlene Brooke, PharmD, BCACP Clinical Pharmacist [facility] [phone]

## 2021-09-09 ENCOUNTER — Telehealth: Payer: Self-pay | Admitting: Pharmacist

## 2021-09-09 NOTE — Patient Instructions (Signed)
Visit Information  Phone number for Pharmacist: [phone]   Goals Addressed             This Visit's Progress    Monitor and Manage My Blood Sugar-Diabetes Type 2       Timeframe:  Long-Range Goal Priority:  High Start Date:    09/05/21                         Expected End Date: 09/05/22                       Follow Up Date March 2023   - check blood sugar at prescribed times - check blood sugar if I feel it is too high or too low - take the blood sugar log to all doctor visits - take the blood sugar meter to all doctor visits    Why is this important?   Checking your blood sugar at home helps to keep it from getting very high or very low.  Writing the results in a diary or log helps the doctor know how to care for you.  Your blood sugar log should have the time, date and the results.  Also, write down the amount of insulin or other medicine that you take.  Other information, like what you ate, exercise done and how you were feeling, will also be helpful.     Notes:         Care Plan : [place]  Updates made by Charlton Haws, RPH since 09/09/2021 12:00 AM     Problem: Hypertension, Hyperlipidemia, Diabetes, Heart Failure, Coronary Artery Disease, Chronic Kidney Disease, and Depression   Priority: High     Long-Range Goal: Disease Management   Start Date: 09/26/2020  Expected End Date: 09/09/2022  This Visit's Progress: Not on track  Recent Progress: Not on track  Priority: High  Note:   Current Barriers:  Unable to achieve control of diabetes Caregiver stress  Pharmacist Clinical Goal(s):  Patient will achieve control of diabetes and blood pressure as evidenced by home BG /BP log through collaboration with PharmD and provider.   Interventions: 1:1 collaboration with [name], NP regarding development and update of comprehensive plan of care as evidenced by provider attestation and co-signature Inter-disciplinary care team  collaboration (see longitudinal plan of care) Comprehensive medication review performed; medication list updated in electronic medical record  Hypertension / Heart Failure (BP goal <130/80) -Controlled - BP at goal per clinic readings; pt endorses dizziness and falls since summer 2022, this has been evaluated by cardiology and orthostasis was ruled out, they believe it is vertigo and have referred to ENT, per pt has not heard anything from ENT and it has been ~6 weeks since referral -Last EF 55% (03/2021), previously 25-30% in 2017 -Current home BP readings: n/a -Current treatment: Enalapril 10 mg daily - Appropriate, Effective, Safe, Accessible Carvedilol 3.125mg  BID - Appropriate, Effective, Safe, Accessible Isosorbide mononitrate 30 mg daily -Appropriate, Effective, Safe, Accessible Furosemide 20 mg 3x weekly - Appropriate, Effective, Safe, Accessible Spironolactone 25 mg daily - Appropriate, Effective, Safe, Accessible -Medications previously tried: none reported -Counseled to monitor BP at home with periodically -Looked up [facility] phone, advised pt to call [phone] for appt -Recommended to continue current medication  Hyperlipidemia / CAD (LDL goal < 70) -Controlled - LDL 39; pt endorses compliance with statin -Hx NSTEMI 2016; Hx PAD -Current treatment: Rosuvastatin 40 mg daily -  Appropriate, Effective, Safe, Accessible Ezetimibe 10 mg daily - Appropriate, Effective, Safe, Accessible -Medications previously tried: none -Recommended to continue current medication  Diabetes (A1c goal <7%) -Uncontrolled, improving - A1c improved from >10 to 8.1% recently; she still has frequent lows; she did not bring her [facility] with her today so unable to verify exact readings, but she reports lows 50s-60s several times a week, usually occurring after lunchtime Novolog dose or overnight, she treats lows with Pepsi. She reports she is no longer aware of hypoglycemia, she only knows  about episodes due to CGM alarm -Of note pt reports appetite has been poor lately; she does not take Novolog if she does not eat -Current medications: Novolog - 15 units with lunch and dinner - Appropriate, Effective, Query Safe, Accessible Lantus Solostar - 30 units AM and PM -Appropriate, Effective, Query Safe, Accessible Freestyle Libre 2  -Medications previously tried: glipizide, Januvia, metformin (diarrhea), Trulicity (cost) -Current home glucose readings: She uses the [employer] 2 system with reader. She has an alarm when it reaches 70. She scans about 6-7/day. She does not have reader or BG log with her today. -Current meal patterns: She reports she has cut out all "sweets". Discussed importance of eating 3 meals per day and keeping carbohydrate levels steady due to her tendency for low BG. 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; he can walk but is mostly bed/chair-bound; she reports significant depressed mood, hopelessness, poor appetite, poor sleep; her 2 children do occasionally help which she is grateful for; she has been unable to use home health nurses/aids because her husband has PTSD and is unable to tolerate strangers in his home -She typically goes to bed at 10-11 pm, and wakes at 3-4 AM -PHQ9: 15 (08/2021 - during this visit) - moderate/severe depression -Connected with PCP for mental health support -Current treatment: Amitriptyline 50 mg HS - Appropriate, Query Effective Trazodone 100 mg HS PRN - Appropriate, Query  Effective -Medications previously tried/failed: duloxetine, escitalopram  -Educated on Benefits of cognitive-behavioral therapy with or without medication at length, pt could benefit from individual or group therapy however she declined referral today, stating "talking about it won't help" -Discussed caregiver stress/burnout, her situation is complicated by her husband's PTSD so only family/friends can help her -Recommend trial of mirtazapine 7.5 mg at bedtime; discontinue trazodone, can continue amitriptyline; monitor closely for side effects  Health Maintenance -Vaccine gaps: Shingrix, covid booster -Pt has CKD stage 3b; she is taking meloxicam - did not address today, would recommend to discontinue this at follow up   Patient Goals/Self-Care Activities Patient will:  - take medications as prescribed as evidenced by patient report and record review focus on medication adherence by routine check glucose via CGM, document, and provide at future appointments -Call [facility] for appt -Reduce Novolog to 10 units with meals -Consider counseling for caregiver stress/burnout      Patient verbalizes understanding of instructions and care plan provided today and agrees to view in [place]. Active MyChart status confirmed with patient.   Telephone follow up appointment with pharmacy  team member scheduled for: 1 month  Charlene Brooke, PharmD, [facility] Clinical Pharmacist [facility] at [facility] [phone]

## 2021-09-09 NOTE — Telephone Encounter (Signed)
Noted, can discuss at upcoming visit or she may schedule a visit sooner.

## 2021-09-09 NOTE — Telephone Encounter (Signed)
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 PHQ-9 performed during visit was 35, she endorses issues with low energy, sleep and appetite in particular. I recommended referral to psychiatry or counseling but she declines, stating "talking about it won't help". She also reports home health/aid is not on option because her husband has PTSD and has never been able to tolerate strangers in his home.   She requested medication to help her symptoms. Of note she is already taking amitriptyline 50 mg at bedtime mainly for pain.  Recommendation: -Trial mirtazapine 7.5 mg at bedtime to target sleep/appetite issues. Low dose is relatively safe to use with amitriptyline with close monitoring. -Continue to encourage counseling referral  Forwarding to PCP. Next PCP visit is 10/22/21.    Depression screen [facility] 2/9 09/05/2021 02/06/2021 04/02/2020  Decreased Interest 3 0 3  Down, Depressed, Hopeless 3 0 3  PHQ - 2 Score 6 0 6  Altered sleeping 3 0 3  Tired, decreased energy 3 0 3  Change in appetite 3 0 0  Feeling bad or failure about yourself  0 0 0  Trouble concentrating 0 0 0  Moving slowly or fidgety/restless 0 0 0  Suicidal thoughts 0 0 0  PHQ-9 Score 15 0 12  Difficult doing work/chores Not difficult at all Not difficult at all Extremely dIfficult  Some recent data might be hidden

## 2021-09-16 ENCOUNTER — Other Ambulatory Visit: Payer: Self-pay | Admitting: Surgery

## 2021-09-16 DIAGNOSIS — M19012 Primary osteoarthritis, left shoulder: Secondary | ICD-10-CM | POA: Insufficient documentation

## 2021-09-16 DIAGNOSIS — M7582 Other shoulder lesions, left shoulder: Secondary | ICD-10-CM

## 2021-09-16 DIAGNOSIS — E1151 Type 2 diabetes mellitus with diabetic peripheral angiopathy without gangrene: Secondary | ICD-10-CM | POA: Diagnosis not present

## 2021-09-17 ENCOUNTER — Other Ambulatory Visit: Payer: Self-pay | Admitting: Primary Care

## 2021-09-17 DIAGNOSIS — F32A Depression, unspecified: Secondary | ICD-10-CM | POA: Diagnosis not present

## 2021-09-17 DIAGNOSIS — E1151 Type 2 diabetes mellitus with diabetic peripheral angiopathy without gangrene: Secondary | ICD-10-CM

## 2021-09-17 DIAGNOSIS — I25118 Atherosclerotic heart disease of native coronary artery with other forms of angina pectoris: Secondary | ICD-10-CM | POA: Diagnosis not present

## 2021-09-17 DIAGNOSIS — I10 Essential (primary) hypertension: Secondary | ICD-10-CM

## 2021-09-17 DIAGNOSIS — E782 Mixed hyperlipidemia: Secondary | ICD-10-CM

## 2021-09-17 DIAGNOSIS — I5022 Chronic systolic (congestive) heart failure: Secondary | ICD-10-CM | POA: Diagnosis not present

## 2021-09-18 ENCOUNTER — Other Ambulatory Visit: Payer: Self-pay | Admitting: Surgery

## 2021-09-19 ENCOUNTER — Telehealth: Payer: Self-pay

## 2021-09-19 DIAGNOSIS — E039 Hypothyroidism, unspecified: Secondary | ICD-10-CM

## 2021-09-19 NOTE — Telephone Encounter (Signed)
Let patient know that she has called in refill on sensors and she should be able to pick up at pharmacy.  ? ?She is requesting refill on Clobetasol. She is aware this will be addressed with Anda Kraft is back in the office.  ?

## 2021-09-19 NOTE — Telephone Encounter (Signed)
Patient is calling requesting refill on clobetasol ointment. Please advise

## 2021-09-19 NOTE — Telephone Encounter (Signed)
Routing to MA to find out what it uses the medication for. ? ?Does not appear that Sarah Payne has ever filled this for her, would recommend waiting for [name]'s return ?

## 2021-09-20 ENCOUNTER — Telehealth: Payer: Self-pay | Admitting: Cardiovascular Disease

## 2021-09-20 NOTE — Telephone Encounter (Signed)
? ?  Pre-operative Risk Assessment  ?  ?Patient Name: Sarah Payne  ?DOB: [birth year] ?MRN: [id number]  ? ?  ? ?Request for Surgical Clearance   ? 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 ?Date of Surgery:  Clearance 10/01/21                              ?   ?Surgeon:  Dr Elisabeth Pigeon Poggi  ?Surgeon's Group or Practice Name:  [facility]  ?Phone number:  [phone] ?Fax number:  [phone] ?  ?Type of Clearance Requested:   ?- Medical  ?  ?Type of Anesthesia:  Not Indicated ?  ?Additional requests/questions:   ? ?Signed, ?Caryl Pina Gerringer   ?09/20/2021, 3:43 PM  ? ?

## 2021-09-22 NOTE — Telephone Encounter (Signed)
Looks like her GYN was prescribing clobetasol. ?Have her call GYN office for a refill.  ?

## 2021-09-23 ENCOUNTER — Telehealth: Payer: Self-pay

## 2021-09-23 NOTE — Telephone Encounter (Signed)
? ?  Primary Cardiologist: Ida Rogue, MD ? ?Chart reviewed as part of pre-operative protocol coverage. Given past medical history and time since last visit, based on ACC/AHA guidelines, Sarah Payne would be at acceptable risk for the planned procedure without further cardiovascular testing.  ? ?Her RCRI is a class IV risk, 11% risk of major cardiac event. ? ? ?I will route this recommendation to the requesting party via Epic fax function and remove from pre-op pool. ? ?Please call with questions. ? ?Jossie Ng. Noe Pittsley NP-C ? ?  ?09/23/2021, 3:31 PM ?[place] ?[address] ?Office [phone] Fax [phone] ? ? ? ? ?

## 2021-09-23 NOTE — Telephone Encounter (Signed)
No answer no voice mail  

## 2021-09-23 NOTE — Progress Notes (Signed)
? ? ?Chronic Care Management ?Pharmacy Assistant  ? ?Name: Sarah Payne  MRN: [id number] DOB: [birth year] ? ?Reason for Encounter: CCM (Diabetes Disease State) ?  ?Recent office visits:  ?None since last CCM contact ? ?Recent consult visits:  ?09/16/2021 - [facility] - Orthopedic Surgery - Patient presented for primary osteoarthiritis of left shoulder. Surgery date 10/01/2021  ? ?Hospital visits:  ?None since last CCM contact ? ?Medications: ?Outpatient Encounter Medications as of 09/23/2021  ?Medication Sig  ? albuterol (VENTOLIN HFA) 108 (90 Base) MCG/ACT inhaler Inhale 2 puffs into the lungs every 6 (six) hours as needed for wheezing or shortness of breath. (Patient not taking: Reported on 09/20/2021)  ? amitriptyline (ELAVIL) 50 MG tablet Take 1 tablet (50 mg total) by mouth at bedtime as needed for sleep.  ? carvedilol (COREG) 3.125 MG tablet Take 1 tablet (3.125 mg total) by mouth 2 (two) times daily.  ? clopidogrel (PLAVIX) 75 MG tablet Take 1 tablet (75 mg total) by mouth daily.  ? Continuous Blood Gluc Receiver (FREESTYLE LIBRE 2 READER) DEVI Use with sensor to check blood sugars 6 times daily.  ? Continuous Blood Gluc Sensor (FREESTYLE LIBRE 2 SENSOR) MISC USE TO CHECK SUGARS 6 TIMES DAILY  ? enalapril (VASOTEC) 10 MG tablet TAKE 1 TABLET BY MOUTH ONCE DAILY  ? ezetimibe (ZETIA) 10 MG tablet Take 1 tablet (10 mg total) by mouth daily. For cholesterol.  ? furosemide (LASIX) 20 MG tablet Take one tab only on Monday, Wednesday, & Fridays  ? gabapentin (NEURONTIN) 300 MG capsule Take 1 capsule (300 mg total) by mouth 3 (three) times daily.  ? glucose blood (FREESTYLE LITE) test strip Test up to four times a day as needed. DX  ? insulin aspart (NOVOLOG FLEXPEN) 100 UNIT/ML FlexPen Inject 10 units before lunch for diabetes. (Patient taking differently: Inject 10 Units into the skin 2 (two) times daily before a meal.)  ? insulin glargine (LANTUS SOLOSTAR) 100 UNIT/ML Solostar Pen Inject 30 Units into the skin at  bedtime. For diabetes. (Patient taking differently: Inject 60 Units into the skin at bedtime. For diabetes.)  ? Insulin Pen Needle (INSUPEN PEN NEEDLES) 32G X 4 MM MISC Use to inject insulin 4 times daily.  ? isosorbide mononitrate (IMDUR) 30 MG 24 hr tablet Take 1 tablet (30 mg) by mouth once daily at bedtime  ? levothyroxine (SYNTHROID) 100 MCG tablet Take 1 tablet by mouth on an empty stomach with water only on Sunday, Monday, and Tuesday. No food or other medications for 30 minutes.  ? levothyroxine (SYNTHROID) 88 MCG tablet Take 1 tablet by mouth every morning on an empty stomach with water only on Wednesday, Thursday, Friday, Saturday.  No food or other medications for 30 minutes.  ? meclizine (ANTIVERT) 25 MG tablet Take one tab up to every 6 hrs as needed for vertigo (Patient not taking: Reported on 09/20/2021)  ? meloxicam (MOBIC) 15 MG tablet Take 15 mg by mouth daily.  ? potassium chloride (KLOR-CON) 10 MEQ tablet Only take on days you take your lasix. Take 1 tablet (10 mEq total) by mouth for 1 dose on those days you take your lasix '20mg'$  pill.  ? rosuvastatin (CRESTOR) 40 MG tablet Take 1 tablet (40 mg total) by mouth every evening. For cholesterol.  ? spironolactone (ALDACTONE) 25 MG tablet Take 1 tablet (25 mg total) by mouth daily.  ? traZODone (DESYREL) 100 MG tablet TAKE 1 TABLET BY MOUTH AT BEDTIME AS NEEDED FOR SLEEP  ? ?No  facility-administered encounter medications on file as of 09/23/2021.  ? ?Recent Relevant Labs: ?Lab Results  ?Component Value Date/Time  ? HGBA1C 8.1 (H) 07/18/2021 02:43 PM  ? HGBA1C 8.4 (A) 02/15/2021 09:17 AM  ? HGBA1C 8.5 (A) 11/14/2020 11:14 AM  ? HGBA1C 10.3 (H) 12/24/2018 12:27 PM  ? HGBA1C 10.9 (H) 08/02/2014 04:08 AM  ? HGBA1C 7.3 (H) 08/19/2013 12:19 PM  ? MICROALBUR 1.2 05/30/2013 04:48 PM  ?  ?Kidney Function ?Lab Results  ?Component Value Date/Time  ? CREATININE 1.67 (H) 04/19/2021 09:59 AM  ? CREATININE 2.24 (H) 04/12/2021 12:58 PM  ? CREATININE 1.06 (H) 04/04/2016  05:21 PM  ? CREATININE 1.24 08/02/2014 04:08 AM  ? CREATININE 1.33 (H) 08/01/2014 12:53 PM  ? CREATININE 1.07 11/24/2011 03:41 PM  ? GFR 44.80 (L) 08/03/2020 10:05 AM  ? GFRNONAA 43 (L) 01/08/2021 11:55 AM  ? GFRNONAA 46 (L) 08/02/2014 04:08 AM  ? GFRNONAA 47 (L) 08/20/2013 05:07 AM  ? GFRNONAA 55 (L) 11/24/2011 03:41 PM  ? GFRAA 55 (L) 08/30/2020 09:41 AM  ? GFRAA 56 (L) 08/02/2014 04:08 AM  ? GFRAA 55 (L) 08/20/2013 05:07 AM  ? GFRAA 64 11/24/2011 03:41 PM  ? ?Contacted patient on 09/25/2021 to discuss diabetes disease state.  ? ?Current antihyperglycemic regimen:  ?Novolog 10 units with lunch and dinner ?Lantus Solostar - 30 units AM and PM ?Freestyle Libre 2 ?  Patient verbally confirms she is taking the above medications as directed. Yes ? ?What diet changes have been made to improve diabetes control? Patient has not had an appetite.  ? ?What recent interventions/DTPs have been made to improve glycemic control:  ?Reduce Novolog to 10 units with lunch and dinner.  ?Pt will bring Freestyle  Libre meter to next CCM appt to download AGP.  ? ?Have there been any recent hospitalizations or ED visits since last visit with CPP? No ? ?Has patient heard for ENT referral?  ?Call [facility] [phone] if not.  ? ?Patient reports hypoglycemic symptoms, including Pale, Sweaty, Shaky, Hungry, Nervous/irritable, and Vision changes; throwing up and falling down.  ? ?Patient denies hyperglycemic symptoms, including blurry vision, excessive thirst, fatigue, polyuria, and weakness ? ?How often are you checking your blood sugar? Multiple times a day.  ? ?During the week, how often does your blood glucose drop below 70? Patient states her sugar will drop to 45 at least one time a day to a couple times a day. She was so sick on 09/21/2021 when it happened at 2:30 am that patient fell and was throwing up. Patient drank a pepsi and had orange juice to get her numbers up. Patient did not check her numbers when it started or when she  started feeling better. She knows when she went to bed at 10:00 pm on 09/20/2021 her glucose was 110. She stated it happens a lot that it drops to 45. She said she has not been eating a lot. I asked patient for past glucose readings and patient did not have any as she does not know how to read past numbers in her machine.  ? ?Patient is having shoulder replacement surgery next Tuesday 10/01/2021. Patient stated she is in so much pain she can not sleep and does not feel like doing anything.  ? ?Adherence Review: ?Is the patient currently on a STATIN medication? Yes ?Is the patient currently on ACE/ARB medication? Yes ?Does the patient have >5 day gap between last estimated fill dates? Unsure as patient receives Rosuvastatin through the [place] ? ?  Care Gaps: ?Annual wellness visit in last year? Yes 02/06/2021 ?Most recent A1C reading: 8.1 on 07/18/2021 ?Most Recent BP reading: 110/68 on 07/23/2021 ? ?Last eye exam / retinopathy screening: Up to date ?Last diabetic foot exam: 02/01/2020 ? ?Star Rating Drugs:  ?Medication:  Last Fill: Day Supply ?[facility] ?Enalapril 10 mg 09/09/2021 90 ? ?CCM appointment on 10/02/2021 ?PCP appointment on 10/11/2021 ?PCP appointment on 10/22/2021 ? ?Charlene Brooke, CPP notified ? ?[name], RMA ?Clinical Pharmacy Assistant ?[phone] ? ? ? ?

## 2021-09-24 ENCOUNTER — Other Ambulatory Visit: Payer: Self-pay

## 2021-09-24 ENCOUNTER — Other Ambulatory Visit
Admission: RE | Admit: 2021-09-24 | Discharge: 2021-09-24 | Disposition: A | Discharge status: Home / Self Care | Payer: Medicare PPO | Source: Ambulatory Visit | Attending: Surgery | Admitting: Surgery

## 2021-09-24 VITALS — BP 117/69 | HR 66 | Temp 97.4°F | Resp 22 | Ht 67.0 in | Wt 143.4 lb

## 2021-09-24 DIAGNOSIS — Z01812 Encounter for preprocedural laboratory examination: Secondary | ICD-10-CM

## 2021-09-24 DIAGNOSIS — Z01818 Encounter for other preprocedural examination: Secondary | ICD-10-CM | POA: Insufficient documentation

## 2021-09-24 LAB — CBC WITH DIFFERENTIAL/PLATELET
Abs Immature Granulocytes: 0.04 10*3/uL (ref 0.00–0.07)
Basophils Absolute: 0 10*3/uL (ref 0.0–0.1)
Basophils Relative: 0 %
Eosinophils Absolute: 0.2 10*3/uL (ref 0.0–0.5)
Eosinophils Relative: 2 %
HCT: 40.1 % (ref 36.0–46.0)
Hemoglobin: 13.1 g/dL (ref 12.0–15.0)
Immature Granulocytes: 0 %
Lymphocytes Relative: 29 %
Lymphs Abs: 2.7 10*3/uL (ref 0.7–4.0)
MCH: 31.2 pg (ref 26.0–34.0)
MCHC: 32.7 g/dL (ref 30.0–36.0)
MCV: 95.5 fL (ref 80.0–100.0)
Monocytes Absolute: 0.8 10*3/uL (ref 0.1–1.0)
Monocytes Relative: 8 %
Neutro Abs: 5.7 10*3/uL (ref 1.7–7.7)
Neutrophils Relative %: 61 %
Platelets: 287 10*3/uL (ref 150–400)
RBC: 4.2 MIL/uL (ref 3.87–5.11)
RDW: 12.9 % (ref 11.5–15.5)
WBC: 9.5 10*3/uL (ref 4.0–10.5)
nRBC: 0 % (ref 0.0–0.2)

## 2021-09-24 LAB — COMPREHENSIVE METABOLIC PANEL
ALT: 21 U/L (ref 0–44)
AST: 23 U/L (ref 15–41)
Albumin: 3.9 g/dL (ref 3.5–5.0)
Alkaline Phosphatase: 66 U/L (ref 38–126)
Anion gap: 8 (ref 5–15)
BUN: 43 mg/dL — ABNORMAL HIGH (ref 8–23)
CO2: 24 mmol/L (ref 22–32)
Calcium: 9.6 mg/dL (ref 8.9–10.3)
Chloride: 104 mmol/L (ref 98–111)
Creatinine, Ser: 1.43 mg/dL — ABNORMAL HIGH (ref 0.44–1.00)
GFR, Estimated: 39 mL/min — ABNORMAL LOW (ref >60)
Glucose, Bld: 201 mg/dL — ABNORMAL HIGH (ref 70–99)
Potassium: 4.9 mmol/L (ref 3.5–5.1)
Sodium: 136 mmol/L (ref 135–145)
Total Bilirubin: 0.6 mg/dL (ref 0.3–1.2)
Total Protein: 6.9 g/dL (ref 6.5–8.1)

## 2021-09-24 LAB — SURGICAL PCR SCREEN
MRSA, PCR: NEGATIVE
Staphylococcus aureus: NEGATIVE

## 2021-09-24 LAB — URINALYSIS, ROUTINE W REFLEX MICROSCOPIC
Bilirubin Urine: NEGATIVE
Glucose, UA: NEGATIVE mg/dL
Hgb urine dipstick: NEGATIVE
Ketones, ur: NEGATIVE mg/dL
Leukocytes,Ua: NEGATIVE
Nitrite: NEGATIVE
Protein, ur: NEGATIVE mg/dL
Specific Gravity, Urine: 1.019 (ref 1.005–1.030)
pH: 5 (ref 5.0–8.0)

## 2021-09-24 LAB — TYPE AND SCREEN
ABO/RH(D): O POS
Antibody Screen: NEGATIVE

## 2021-09-24 NOTE — Patient Instructions (Addendum)
?Your procedure is scheduled on: Tuesday October 01, 2021. ?Report to Day Surgery inside [facility] 2nd floor, stop by admissions desk before getting on elevator.  ?To find out your arrival time please call [phone] between 1PM - 3PM on Monday Tuesday September 30, 2021. ? ?Remember: Instructions that are not followed completely may result in serious medical risk,  ?up to and including death, or upon the discretion of your surgeon and anesthesiologist your  ?surgery may need to be rescheduled.  ? ?  _X__ 1. Do not eat food after midnight the night before your procedure. ?                No chewing gum or hard candies. You may drink clear liquids up to 2 hours ?                before you are scheduled to arrive for your surgery- DO not drink clear ?                liquids within 2 hours of the start of your surgery. ?                Clear Liquids include:  water, apple juice without pulp, clear Gatorade, G2 or  ?                Gatorade Zero (avoid Red/Purple/Blue), Black Coffee or Tea (Do not add ?                anything to coffee or tea). ? ?__X__2.   Complete the "Ensure Clear Pre-surgery Clear Carbohydrate Drink" provided to you, 2 hours before arrival. **If you are diabetic you will be provided with an alternative drink, Gatorade Zero or G2. ? ?__X__3.  On the morning of surgery brush your teeth with toothpaste and water, you ?               may rinse your mouth with mouthwash if you wish.  Do not swallow any toothpaste of mouthwash. ?   ? _X__ 4.  No Alcohol for 24 hours before or after surgery. ? ? _X__ 5.  Do Not Smoke or use e-cigarettes For 24 Hours Prior to Your Surgery. ?                Do not use any chewable tobacco products for at least 6 hours prior to ?                Surgery. ? ?_X__  6.  Do not use any recreational drugs (marijuana, cocaine, heroin, ecstasy, MDMA or other) ?               For at least one week prior to your surgery.  Combination of these drugs with  anesthesia ?               May have life threatening results. ? ?____  7.  Bring all medications with you on the day of surgery if instructed.  ? ?__X_ 8.  Notify your doctor if there is any change in your medical condition  ?    (cold, fever, infections). ?    ?Do not wear jewelry, make-up, hairpins, clips or nail polish. ?Do not wear lotions, powders, or perfumes. You may wear deodorant. ?Do not shave 48 hours prior to surgery. Men may shave face and neck. ?Do not bring valuables to the hospital.   ? ?[facility] is not responsible for any belongings or valuables. ? ?  Contacts, dentures or bridgework may not be worn into surgery. ?Leave your suitcase in the car. After surgery it may be brought to your room. ?For patients admitted to the hospital, discharge time is determined by your ?treatment team. ?  ?Patients discharged the day of surgery will not be allowed to drive home.   ?Make arrangements for someone to be with you for the first 24 hours of your ?Same Day Discharge. ? ?  ?Please read over the following fact sheets that you were given:  ? Total Joint Packet  ? ? ?__X__ Take these medicines the morning of surgery with A SIP OF WATER:  ? ? 1. carvedilol (COREG) 3.125 MG ? 2. gabapentin (NEURONTIN) 300 MG ? 3. levothyroxine (SYNTHROID) 100 MCG  ? 4. ? 5. ? 6. ? ?____ Fleet Enema (as directed)  ? ?__X__ Use CHG Soap (or wipes) as directed ? ?__X__ Use Benzoyl Peroxide Gel as instructed ? ?____ Use inhalers on the day of surgery ? ?____ Stop metformin 2 days prior to surgery   ? ?__X__ Take 1/2 of usual insulin dose the night before surgery. No insulin the morning ?         of surgery. insulin glargine (LANTUS SOLOSTAR) 100 UNIT/ML Solostar Pen ? ?__X__ Call your PCP, cardiologist, or Pulmonologist if taking clopidogrel (PLAVIX) 75 MG and ask when to stop before your surgery.  ? ?__X__ One Week prior to surgery- Stop Anti-inflammatories such as Ibuprofen, Aleve, Advil, Motrin, meloxicam (MOBIC), diclofenac,  etodolac, ketorolac, Toradol, Daypro, piroxicam, Goody's or BC powders. OK TO USE TYLENOL IF NEEDED ?  ?__X__ Stop supplements until after surgery.   ? ?____ Bring C-Pap to the hospital.  ? ? ?If you have any questions regarding your pre-procedure instructions,  ?Please call Pre-admit Testing at [phone] ?

## 2021-09-25 ENCOUNTER — Encounter: Payer: Self-pay | Admitting: Surgery

## 2021-09-25 NOTE — Progress Notes (Signed)
Perioperative Services  Pre-Admission/Anesthesia Testing Clinical Review  Date: 09/30/21  Patient Demographics:  Name: Sarah Payne DOB:   [birth year] MRN:   [id number]  Planned Surgical Procedure(s):    Case: [id number] Date/Time: 10/01/21 1324   Procedure: REVERSE SHOULDER ARTHROPLASTY WITH BICEPS TENODESIS. (Left: Shoulder)   Anesthesia type: Choice   Pre-op diagnosis:      Primary osteoarthritis of left shoulder M19.012     Diabetes mellitus type 2 with peripheral artery disease  E11.51     Rotator cuff tendinitis, left M75.82   Location: [facility] OR ROOM 03 / [place] [facility]   Surgeons: Corky Mull, MD   NOTE: Available PAT nursing documentation and vital signs have been reviewed. Clinical nursing staff has updated patient's PMH/PSHx, current medication list, and drug allergies/intolerances to ensure comprehensive history available to assist in medical decision making as it pertains to the aforementioned surgical procedure and anticipated anesthetic course. Extensive review of available clinical information performed. [place] [facility] and PSHx updated with any diagnoses/procedures that  may have been inadvertently omitted during her intake with the pre-admission testing department's nursing staff.  Clinical Discussion:  Sarah Payne is a 76 y.o. female who is submitted for pre-surgical anesthesia review and clearance prior to her undergoing the above procedure. Patient is a Current Smoker (45 pack years). 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 She was last seen in the cardiology clinic on 07/23/2021; notes reviewed.  At the time of her clinic  visit, patient doing well overall from a cardiovascular perspective.  She denied any chest pain, shortness of breath, PND, orthopnea, palpitations, significant peripheral edema, or presyncope/syncope.  Patient with episodes of vertiginous symptoms that are been going on for months despite medication changes.  Patient with a past medical history significant for cardiovascular disease and multiple interventions.  Patient has suffered multiple myocardial infarctions per her report (? 5).  She has undergone multiple diagnostic left heart catheterizations and subsequent PCI procedures as follows:  DATE LOCATION SIZE TYPE  02/06/2011 Mid LCx 2.5 x 12 mm Xience V DES  02/06/2011 Mid LCx 2.5 x 8 mm Xience V DES  02/06/2011 Mid LCx 2.25 x 8 mm Xience Nano DES  09/17/2011 Distal RCA 2.75 x 23 mm Xience V DES  10/02/2011 Proximal LCx 2.5 x 15 mm Xience V DES  08/19/2012 Proximal RCA 3.5 x 23 mm Xience Xpedition DES  08/19/2012 Mid RCA 3.0 x 38 mm Xience Xpedition DES   Last diagnostic heart catheterization was performed on 01/01/2016 which revealed patent stents in the RCA and LCx.  LVEF 25-30%. There was severe left ventricular systolic dysfunction in the pattern of Takotsubo cardiomyopathy.  Patient with extreme situational stress prior to event.  Nephew committed suicide 1 week prior and her sister died the morning of the cardiac event.  Patient underwent orbital atherectomy on 09/12/2020 for subtotal occlusion of the LEFT distal SFA.  Last TTE was performed on 04/11/2021 revealing a normal left ventricular systolic function with an EF of 55%.  There was mild LVH.  Diastolic Doppler parameters consistent with impaired relaxation (G1DD).  There was mild aortic sclerosis with no evidence of stenosis; mean transvalvular gradient 3.0 mmHg.  Long-term cardiac event monitor study performed  on 05/08/2021 revealing a predominant underlying normal sinus rhythm with an average heart rate of 80 bpm; range 53-146  bpm.  There were 3 distinct SVT runs with the fastest lasting 5 beats at a maximum rate of 146 bpm, and the longest lasting 7 beats at an average heart rate of 134 bpm.  There was rare atrial and ventricular ectopy noted.  Given patient's significant cardiovascular history and multiple stent placement, patient remains on daily antiplatelet therapy (clopidogrel); compliant with therapy with no evidence of GI bleeding.  Blood pressure well controlled at 110/68 on prescribed beta-blocker, ACEi, diuretic, and nitrate therapies.  In addition to her prescribed nitrate, patient has a supply of SL NTG to use on a as needed basis; denied recent use.  She is on a statin + ezetimibe for her HLD and further ASCVD prevention.  T2DM loosely controlled on currently prescribed regimen; last HgbA1c was 8.1% when checked on 07/18/2021.  Patient has an OSAH diagnosis, however she does not require the use of nocturnal PAP therapy. Functional capacity limited somewhat by underlying cardio vascular/cardiopulmonary diagnoses, ongoing smoking history, and vertiginous symptoms.  With that being said, patient still felt to be able to achieve at least 4 METS of activity without angina/anginal equivalent symptoms.  Patient was started on meclizine for possible vertigo.  No other changes were made to her medication regimen.  Patient to follow-up with outpatient cardiology in 6 months or sooner if needed.  Sarah Payne is scheduled for an elective LEFT REVERSE SHOULDER ARTHROPLASTY WITH BICEPS TENODESIS on 10/01/2021 with Dr. Milagros Evener, MD.  Given patient's past medical history significant for cardiovascular diagnoses, presurgical cardiac clearance was sought by the performing surgeon's office and PAT team. Per cardiology, "RCRI is a class IV, which is an 11% risk of MACE. Based ACC/AHA guidelines, the patient's past medical history, and the amount of time since her last clinic visit, this patient would be at an overall ACCEPTABLE risk for  the planned procedure without further cardiovascular testing or intervention at this time".  Again, this patient is on daily antiplatelet therapy.  She has been instructed on recommendations from her cardiologist for holding her clopidogrel for 5 days prior to her procedure with plans to restart as soon as postoperative bleeding risk felt to be minimized by her primary attending surgeon.  The patient is aware that her last dose of clopidogrel should be on 09/25/2021.    Patient denies previous perioperative complications with anesthesia in the past. In review of the available records, it is noted that patient underwent a general anesthetic course here (ASA III) in 09/2018 without documented complications.   Vitals with BMI 09/24/2021 07/24/2021 07/23/2021  Height '5\' 7"'$  '5\' 7"'$  '5\' 7"'$   Weight 143 lbs 6 oz 149 lbs 149 lbs  BMI 22.45 68.08 81.10  Systolic 315 - 945  Diastolic 69 - 68  Pulse 66 - 62    Providers/Specialists:   NOTE: Primary physician provider listed below. Patient may have been seen by APP or partner within same practice.   PROVIDER ROLE / SPECIALTY LAST OV  Poggi, Marshall Cork, MD Orthopedics (Surgeon) 09/16/2021  [name], NP Primary Care Provider 08/13/2021  Ida Rogue, MD Cardiology 07/23/2021  Gillis Santa, MD Pain Management 07/09/2021   Allergies:  Patient has no known allergies.  Current Home Medications:   No current facility-administered medications for this encounter.    amitriptyline (ELAVIL) 50 MG tablet   carvedilol (COREG) 3.125 MG tablet   clopidogrel (PLAVIX) 75  MG tablet   enalapril (VASOTEC) 10 MG tablet   ezetimibe (ZETIA) 10 MG tablet   furosemide (LASIX) 20 MG tablet   gabapentin (NEURONTIN) 300 MG capsule   insulin aspart (NOVOLOG FLEXPEN) 100 UNIT/ML FlexPen   insulin glargine (LANTUS SOLOSTAR) 100 UNIT/ML Solostar Pen   isosorbide mononitrate (IMDUR) 30 MG 24 hr tablet   meloxicam (MOBIC) 15 MG tablet   potassium chloride (KLOR-CON) 10  MEQ tablet   rosuvastatin (CRESTOR) 40 MG tablet   spironolactone (ALDACTONE) 25 MG tablet   traZODone (DESYREL) 100 MG tablet   albuterol (VENTOLIN HFA) 108 (90 Base) MCG/ACT inhaler   Continuous Blood Gluc Receiver (FREESTYLE LIBRE 2 READER) DEVI   Continuous Blood Gluc Sensor (FREESTYLE LIBRE 2 SENSOR) MISC   glucose blood (FREESTYLE LITE) test strip   Insulin Pen Needle (INSUPEN PEN NEEDLES) 32G X 4 MM MISC   levothyroxine (SYNTHROID) 100 MCG tablet   levothyroxine (SYNTHROID) 88 MCG tablet   meclizine (ANTIVERT) 25 MG tablet   History:   Past Medical History:  Diagnosis Date   Aortic atherosclerosis (HCC)    Bilateral cataracts 01/2015   a.) s/p extraction   CHF (congestive heart failure) ([place]) 09/16/2013   a.) TTE 09/16/2013: EF 55-60%; mod inferior HK, triv TR; G1DD. b.) TTE 01/02/2016: EF 25-30%; mid-apicalateroseptal, lat, inf, inferoseptal, apical akinesis; triv TR; G1DD. c. TTE 04/16/2016: EF 65%; G1DD. d.) TTE 05/02/2020: EF 50-55%; G2DD. e.) TTE 04/11/2021: EF 55%; G1DD.   Chronic low back pain    Clostridioides difficile infection 2015   COPD (chronic obstructive pulmonary disease) (HCC)    Coronary artery disease    a.) PCI 02/06/2011: 100% mLCx - DES x 3. b.) PCI 09/17/2011: 95% dRCA - DES x 1. c.) PCI 10/02/2011: 75% pLCX - DES x 1. d.) PCI 08/19/2012: DES x 2 p-mRCA.  e.) [place] 01/01/16: Takotsubo event 01/01/2016 with patent stents   Depression    Diverticulosis    Frequent PVCs    a. Noted in hospital 12/2015.   GERD (gastroesophageal reflux disease)    History of heart artery stent    a.) TOTAL of 7 stents: a.) 02/06/2011 - overlapping 2.5x12 mm Xience V, 2.5x8 mm Xience V, 2.25x8 mm Xience Nano to mLCx. b.) 09/17/2011 - 2.5x23 Xience V dRCA. c.) 10/02/2011 - 2.5x15 mm Xience V pLCx. d.) 08/09/2012 - overlapping 3.0x49m mRCA and 3.5x23mpRCA   Hyperlipidemia    Hypertension    Hypothyroidism    Impingement syndrome of left shoulder    LBBB (left bundle branch  block)    Long term current use of antithrombotics/antiplatelets    a.) clopidogrel   Marijuana abuse    [facility]   a.) multiple MIs;  5 per her report   OSA (obstructive sleep apnea)    a.) mild; does not require nocturnal PAP therapy   Paroxysmal SVT ([place]ricular tachycardia) ([place]/19/2022   a.)  Zio patch 05/08/2021: 3 distinct SVT runs; fastest 5 beats at a rate of 146 bpm; longest 7 beats at rate of 134 bpm   T2DM (type 2 diabetes mellitus) ([place]   Takotsubo cardiomyopathy    a. 12/2015 - nephew committed suicide 1 week prior, sister died the morning of presentation - initially called a STEMI; cath with patent stents. LVEF 25-30%.   Tendonitis of left rotator cuff    Tobacco abuse    Vascular dementia    Past Surgical History:  Procedure Laterality Date   ABDOMINAL AORTOGRAM W/LOWER EXTREMITY N/A  09/12/2020   Procedure: ABDOMINAL AORTOGRAM W/LOWER EXTREMITY;  Surgeon: Wellington Hampshire, MD;  Location: [place] [facility];  Service: Cardiovascular;  Laterality: N/A;   ABDOMINAL HYSTERECTOMY     APPENDECTOMY     BACK SURGERY     CARDIAC CATHETERIZATION N/A 01/01/2016   Procedure: Left Heart Cath and Coronary Angiography;  Surgeon: Jettie Booze, MD;  Location: [place] [facility];  Service: Cardiovascular;  Laterality: N/A;   CATARACT EXTRACTION W/PHACO Left 01/30/2015   Procedure: CATARACT EXTRACTION PHACO AND INTRAOCULAR LENS PLACEMENT (IOC);  Surgeon: Birder Robson, MD;  Location: [facility];  Service: Ophthalmology;  Laterality: Left;  [place] 00:47    CATARACT EXTRACTION W/PHACO Right 02/13/2015   Procedure: CATARACT EXTRACTION PHACO AND INTRAOCULAR LENS PLACEMENT (IOC);  Surgeon: Birder Robson, MD;  Location: [facility];  Service: Ophthalmology;  Laterality: Right;  cassette lot # [id number] H [id number]  00:29.9 AP  20.7 CDE  6.20   COLONOSCOPY N/A 11/02/2014   Procedure: COLONOSCOPY;  Surgeon: Inda Castle, MD;  Location: [place];  Service:  Endoscopy;  Laterality: N/A;   CORONARY ANGIOPLASTY WITH STENT PLACEMENT Left 02/06/2011   Procedure: CORONARY ANGIOPLASTY WITH STENT PLACEMENT; Location: [place]; Surgeon: Katrine Coho, MD   CORONARY ANGIOPLASTY WITH STENT PLACEMENT Left 09/17/2011   Procedure: CORONARY ANGIOPLASTY WITH STENT PLACEMENT; Location: [place]; Surgeon: Katrine Coho, MD   CORONARY ANGIOPLASTY WITH STENT PLACEMENT Left 10/02/2011   Procedure: CORONARY ANGIOPLASTY WITH STENT PLACEMENT; Location: [place]; Surgeon: Katrine Coho, MD   CORONARY ANGIOPLASTY WITH STENT PLACEMENT Left 08/19/2012   Procedure: CORONARY ANGIOPLASTY WITH STENT PLACEMENT; Location: [place]; Surgeon: Kathlyn Sacramento, MD   ESOPHAGOGASTRODUODENOSCOPY (EGD) WITH PROPOFOL N/A 09/21/2018   Procedure: ESOPHAGOGASTRODUODENOSCOPY (EGD) WITH PROPOFOL;  Surgeon: [name], MD;  Location: [facility] ENDOSCOPY;  Service: Gastroenterology;  Laterality: N/A;   LEFT HEART CATH AND CORONARY ANGIOGRAPHY Left 08/02/2014   Procedure: LEFT HEART CATH AND CORONARY ANGIOGRAPHY; Location: [facility]; Surgeon: Glenetta Hew, MD   PERIPHERAL VASCULAR ATHERECTOMY Left 09/12/2020   Procedure: PERIPHERAL VASCULAR ATHERECTOMY;  Surgeon: Wellington Hampshire, MD;  Location: [place] [facility];  Service: Cardiovascular;  Laterality: Left;   PERIPHERAL VASCULAR BALLOON ANGIOPLASTY  09/12/2020   Procedure: PERIPHERAL VASCULAR BALLOON ANGIOPLASTY;  Surgeon: Wellington Hampshire, MD;  Location: [place] [facility];  Service: Cardiovascular;;   SHOULDER SURGERY Left 2017   Family History  Problem Relation Age of Onset   Heart attack Mother        First MI @ 73 - Died @ 36   Heart disease Mother    Heart disease Father        Died @ 28   Throat cancer Brother    Liver cancer Brother    Colon cancer Sister    Social History   Tobacco Use   Smoking status: Every Day    Packs/day: 1.00    Years: 45.00    Pack years: 45.00    Types: Cigarettes   Smokeless tobacco: Never   Tobacco  comments:    Has cut back, trying to quit.   Vaping Use   Vaping Use: Never used  Substance Use Topics   Alcohol use: No    Alcohol/week: 0.0 standard drinks   Drug use: Yes    Types: Marijuana    Comment: last noc    Pertinent Clinical Results:  LABS: Labs reviewed: Acceptable for surgery.  Hospital Outpatient Visit on 09/24/2021  Component Date Value Ref Range Status   MRSA, PCR 09/24/2021 NEGATIVE  NEGATIVE  Final   Staphylococcus aureus 09/24/2021 NEGATIVE  NEGATIVE Final   Comment: (NOTE) The Xpert SA Assay (FDA approved for NASAL specimens in patients 16 years of age and older), is one component of a comprehensive surveillance program. It is not intended to diagnose infection nor to guide or monitor treatment. Performed at  [facility], [address] [place], [place] [address]   WBC 09/24/2021 9.5  4.0 - 10.5 K/uL Final   RBC 09/24/2021 4.20  3.87 - 5.11 MIL/uL Final   Hemoglobin 09/24/2021 13.1  12.0 - 15.0 g/dL Final   HCT 09/24/2021 40.1  36.0 - 46.0 % Final   MCV 09/24/2021 95.5  80.0 - 100.0 fL Final   MCH 09/24/2021 31.2  26.0 - 34.0 pg Final   MCHC 09/24/2021 32.7  30.0 - 36.0 g/dL Final   RDW 09/24/2021 12.9  11.5 - 15.5 % Final   Platelets 09/24/2021 287  150 - 400 K/uL Final   nRBC 09/24/2021 0.0  0.0 - 0.2 % Final   Neutrophils Relative % 09/24/2021 61  % Final   Neutro Abs 09/24/2021 5.7  1.7 - 7.7 K/uL Final   Lymphocytes Relative 09/24/2021 29  % Final   Lymphs Abs 09/24/2021 2.7  0.7 - 4.0 K/uL Final   Monocytes Relative 09/24/2021 8  % Final   Monocytes Absolute 09/24/2021 0.8  0.1 - 1.0 K/uL Final   Eosinophils Relative 09/24/2021 2  % Final   Eosinophils Absolute 09/24/2021 0.2  0.0 - 0.5 K/uL Final   Basophils Relative 09/24/2021 0  % Final   Basophils Absolute 09/24/2021 0.0  0.0 - 0.1 K/uL Final   Immature Granulocytes 09/24/2021 0  % Final   Abs Immature Granulocytes 09/24/2021 0.04  0.00 - 0.07 K/uL Final   Performed at [facility], [place], [place] [address]   Sodium 09/24/2021 136  135 - 145 mmol/L Final   Potassium 09/24/2021 4.9  3.5 - 5.1 mmol/L Final   Chloride 09/24/2021 104  98 - 111 mmol/L Final   CO2 09/24/2021 24  22 - 32 mmol/L Final   Glucose, Bld 09/24/2021 201 (H)  70 - 99 mg/dL Final   Glucose reference range applies only to samples taken after fasting for at least 8 hours.   BUN 09/24/2021 43 (H)  8 - 23 mg/dL Final   Creatinine, Ser 09/24/2021 1.43 (H)  0.44 - 1.00 mg/dL Final   Calcium 09/24/2021 9.6  8.9 - 10.3 mg/dL Final   Total Protein 09/24/2021 6.9  6.5 - 8.1 g/dL Final   Albumin 09/24/2021 3.9  3.5 - 5.0 g/dL Final   AST 09/24/2021 23  15 - 41 U/L Final   ALT 09/24/2021 21  0 - 44 U/L Final   Alkaline Phosphatase 09/24/2021 66  38 - 126 U/L Final   Total Bilirubin 09/24/2021 0.6  0.3 - 1.2 mg/dL Final   GFR, Estimated 09/24/2021 39 (L)  >60 mL/min Final   Comment: (NOTE) Calculated using the CKD-EPI Creatinine Equation (2021)    Anion gap 09/24/2021 8  5 - 15 Final   Performed at [facility], [place], [place] [address]   Color, Urine 09/24/2021 YELLOW (A)  YELLOW Final   APPearance 09/24/2021 CLEAR (A)  CLEAR Final   Specific Gravity, Urine 09/24/2021 1.019  1.005 - 1.030 Final   pH 09/24/2021 5.0  5.0 - 8.0 Final   Glucose, UA 09/24/2021 NEGATIVE  NEGATIVE mg/dL Final   Hgb urine dipstick  09/24/2021 NEGATIVE  NEGATIVE Final   Bilirubin Urine 09/24/2021 NEGATIVE  NEGATIVE Final   Ketones, ur 09/24/2021 NEGATIVE  NEGATIVE mg/dL Final   Protein, ur 09/24/2021 NEGATIVE  NEGATIVE mg/dL Final   Nitrite 09/24/2021 NEGATIVE  NEGATIVE Final   Leukocytes,Ua 09/24/2021 NEGATIVE  NEGATIVE Final   Performed at [facility], [address] [place], [place] [address]   ABO/RH(D) 09/24/2021 O POS   Final   Antibody Screen 09/24/2021 NEG   Final   Sample Expiration 09/24/2021 10/08/2021,2359   Final   Extend sample reason  09/24/2021    Final                   Value:NO TRANSFUSIONS OR PREGNANCY IN THE PAST 3 MONTHS Performed at [facility], [address] [place], [place] [address]     ECG: Date: 09/24/2021 Time ECG obtained: 0929 AM Rate: 66 bpm Rhythm:  Sinus rhythm with first-degree AV block Axis (leads I and aVF): Normal Intervals: PR 228 ms. QRS 90 ms. QTc 442 ms. ST segment and T wave changes: No evidence of acute ST segment elevation or depression.  Evidence of an age undetermined anteroseptal infarct present. Comparison: Similar to previous tracing obtained on 07/23/2021   IMAGING / PROCEDURES: CT SHOULDER LEFT WO CONTRAST performed on 09/27/2021 Severe glenohumeral osteoarthritis with bulky osteophyte formation, subchondral sclerosis, and cystic change. Essentially there is bone on bone articulation.  Adequate glenoid bone stock with mild retroversion.  Minimal AC joint arthropathy.  No significant muscle atrophy. No focal fluid collection.    LONG TERM CARDIAC EVENT MONITOR STUDY performed on 05/08/2021 Predominant underlying sinus rhythm with an average heart rate of 80 bpm; range 53-146 bpm Bundle branch block/IVCD present 3 SVT runs occurred with the fastest lasting 5 beats at a maximum rate of 146 bpm, and the longest lasting 7 beats with an average rate of 134 bpm. Isolated SVE's, SVE couplets, and SVE triplets were rare (<1.0%) Isolated VE's and VE couplets were rare (<1.0%).  There were no VE triplets present. Ventricular trigeminy was present. Rare patient triggered events associated with ectopy.  TRANSTHORACIC ECHOCARDIOGRAM performed on 04/11/2021 Left ventricular ejection fraction, by estimation, is 55%. Left ventricular ejection fraction by 2D MOD biplane is 54.8%. The left ventricle has normal function. The left ventricle has no regional wall motion abnormalities. There is mild left ventricular hypertrophy. Left ventricular diastolic parameters are consistent with  Grade I diastolic dysfunction (impaired relaxation).  Right ventricular systolic function is normal. The right ventricular size is normal.  The mitral valve was not well visualized. No evidence of mitral valve regurgitation.  The aortic valve is tricuspid. Aortic valve regurgitation is not visualized. Mild aortic valve sclerosis is present, with no evidence of aortic valve stenosis.  The inferior vena cava is normal in size with greater than 50% respiratory variability, suggesting right atrial pressure of 3 mmHg.   LEFT HEART CATHETERIZATION AND CORONARY ANGIOGRAPHY performed on 01/01/2016 Patent RCA and LCx stents LAD, RPDA, and RPAV all with minimal or luminal irregularities Severe left ventricular systolic dysfunction in a pattern of Takotsubo cardiomyopathy; LVEF 25-35% Medical management recommended.    Impression and Plan:  Sarah Payne has been referred for pre-anesthesia review and clearance prior to her undergoing the planned anesthetic and procedural courses. Available labs, pertinent testing, and imaging results were personally reviewed by me. This patient has been appropriately cleared by cardiology with an overall ACCEPTABLE risk of significant perioperative cardiovascular complications.  Based on clinical  review performed today (09/30/21), barring any significant acute changes in the patient's overall condition, it is anticipated that she will be able to proceed with the planned surgical intervention. Any acute changes in clinical condition may necessitate her procedure being postponed and/or cancelled. Patient will meet with anesthesia team (MD and/or CRNA) on the day of her procedure for preoperative evaluation/assessment. Questions regarding anesthetic course will be fielded at that time.   Pre-surgical instructions were reviewed with the patient during her PAT appointment and questions were fielded by PAT clinical staff. Patient was advised that if any questions or concerns  arise prior to her procedure then she should return a call to PAT and/or her surgeon's office to discuss.  Honor Loh, MSN, APRN, FNP-C, CEN [facility]  Peri-operative Services Nurse Practitioner Phone: [phone] Fax: [phone] 09/30/21 1:20 PM  NOTE: This note has been prepared using Dragon dictation software. Despite my best ability to proofread, there is always the potential that unintentional transcriptional errors may still occur from this process.

## 2021-09-26 ENCOUNTER — Telehealth: Payer: Self-pay | Admitting: *Deleted

## 2021-09-26 ENCOUNTER — Encounter: Payer: Self-pay | Admitting: Surgery

## 2021-09-26 NOTE — Telephone Encounter (Signed)
Thank you for speaking with her. ? ?I advise every patient, including her, during each diabetes follow up to update me for reasons such as this.  Unfortunately this is the first time hearing of her hypoglycemic episodes. ? ?Also, patient has not followed up with me as I recommended.  She also refused to drive to [place] at my temporary office location. ? ?She was once following with endocrinology but was dismissed given noncompliance with follow-up ? ?Agree with recommendations. ?We will consider GLP-1 at next office visit. ? ?Appreciate the assistance! ?Allie Bossier, NP-C ? ?

## 2021-09-26 NOTE — Telephone Encounter (Signed)
-----   Message from Karen Kitchens, NP sent at 09/26/2021  9:02 AM EST ----- ?Regarding: Request for pre-operative cardiac clearance ?Request for pre-operative cardiac clearance: ? ?NOTE: Medical clearance has already been issued. Need appropriate clearance from cardiology regarding antiplatelet therapy in the setting of patient who is s/p multiple MIs with 7 cardiac stents.  ?? ?1. What type of surgery is being performed?  ?LEFT REVERSE SHOULDER ARTHROPLASTY WITH BICEPS TENODESIS ? ?2. When is this surgery scheduled?  ?10/01/2021 ?? ?3. Type of clearance being requested (medical, pharmacy, both). ?PHARMACY ?  ?4. Are there any medications that need to be held prior to surgery? ?CLOPIDOGREL ? ?5. Practice name and name of physician performing surgery?  ?Performing surgeon: Dr. Milagros Evener, MD ?Requesting clearance: Honor Loh, FNP-C   ?? ?6. Anesthesia type (none, local, MAC, general)? ?GENERAL ? ?7. What is the office phone and fax number?   ?Phone: [phone] ?Fax: [phone] ? ?ATTENTION: Unable to create telephone message as per your standard workflow. Directed by [facility] providers to send requests for cardiac clearance to this pool for appropriate distribution to provider covering pre-operative clearances.  ? ?Honor Loh, MSN, APRN, FNP-C, CEN ?[place]  ?Peri-operative Services Nurse Practitioner ?Phone: [phone] ?09/26/21 8:58 AM ? ? ?

## 2021-09-26 NOTE — Telephone Encounter (Signed)
Patient with history of CAD, prior stent x 3 placed mid left circumflex w/moderate to severe LAD disease, stent placed in distal RCA February 2013 ?Stent to mid left circumflex March 2013 ?Cardiac cath 07/2012 showed moderate mid LAD disease at the takeoff of the diagonal vessel, severe ostial to mid RCA disease, EF > 55% ?She had FFR pressure wire showing severe disease of RCA w/DES x 2 placed.  ?Cardiac cath 12/2015 revealed patent stents ? ?Additionally, she has PAD with orbital atherectomy and drug-coated balloon angioplasty to the left SFA 08/2020 ? ?Dr. Rockey Situ, can you provide clearance for patient to hold clopidogrel for upcoming surgery scheduled for 10/01/21? ?Please route response to p cv div preop ?

## 2021-09-26 NOTE — Telephone Encounter (Addendum)
Spoke with patient about low sugars. ?Patient has not been taking Lantus at all in over a week (previous dose was 30 units BID). ?She is only taking Novolog 15 units at lunch. Even without Lantus she is experiencing frequent hypoglycemia. ? ?Low sugar starts around lunch time, does not come back up with sodas, candy etc for hours. BG has been 45 several times with nausea, vomiting, weakness and falls. She says her CGM is "constantly beeping at her" due to low sugars.  ? ?Ideally patient should be seen in office for evaluation, however she is not able to drive currently due to shoulder pain (she has surgery next week) and does not think she can make it to the office before surgery on Tuesday. She has a PCP appt scheduled 3/24 after surgery and plans to ask her sister in law to drive her. ? ?I think we need to overhaul her insulin regimen in the meantime - I recommended to stop Novolog altogether and restart Lantus at significantly reduced dose of 10 units at night. Our goal is to eliminate the low sugars first, then we can work on hyperglycemia. Would consider GLP-1 or SGLT2 inhibitor to limit risk for hypoglycemia in future. ? ?I will call her next week after surgery to reassess sugars. ? ? ?

## 2021-09-26 NOTE — Telephone Encounter (Signed)
Request for pre-operative cardiac clearance ?Received: Today ?Karen Kitchens, NP  P Cv Div Preop Callback ?Request for pre-operative cardiac clearance:  ? ?NOTE: Medical clearance has already been issued. Need appropriate clearance from cardiology regarding antiplatelet therapy in the setting of patient who is s/p multiple MIs with 7 cardiac stents.  ?   ?1. What type of surgery is being performed?  ?LEFT REVERSE SHOULDER ARTHROPLASTY WITH BICEPS TENODESIS  ? ?2. When is this surgery scheduled?  ?10/01/2021  ?   ?3. Type of clearance being requested (medical, pharmacy, both).  ?PHARMACY  ?   ?4. Are there any medications that need to be held prior to surgery?  ?CLOPIDOGREL  ? ?5. Practice name and name of physician performing surgery?  ?Performing surgeon: Dr. Milagros Evener, MD  ?Requesting clearance: Honor Loh, FNP-C    ?   ?6. Anesthesia type (none, local, MAC, general)?  ?GENERAL  ? ?7. What is the office phone and fax number?    ?Phone: [phone]  ?Fax: [phone]  ? ?ATTENTION: Unable to create telephone message as per your standard workflow. Directed by [facility] providers to send requests for cardiac clearance to this pool for appropriate distribution to provider covering pre-operative clearances.  ? ?Honor Loh, MSN, APRN, FNP-C, CEN  ?[place]  ?Peri-operative Services Nurse Practitioner  ?Phone: [phone]  ?09/26/21 8:58 AM   ?

## 2021-09-27 ENCOUNTER — Ambulatory Visit
Admission: RE | Admit: 2021-09-27 | Discharge: 2021-09-27 | Disposition: A | Discharge status: Home / Self Care | Payer: Medicare PPO | Source: Ambulatory Visit | Attending: Surgery | Admitting: Surgery

## 2021-09-27 ENCOUNTER — Other Ambulatory Visit
Admission: RE | Admit: 2021-09-27 | Discharge: 2021-09-27 | Disposition: A | Discharge status: Home / Self Care | Payer: Medicare PPO | Source: Ambulatory Visit | Attending: Surgery | Admitting: Surgery

## 2021-09-27 ENCOUNTER — Telehealth: Payer: Self-pay

## 2021-09-27 ENCOUNTER — Other Ambulatory Visit: Payer: Self-pay

## 2021-09-27 ENCOUNTER — Encounter: Payer: Self-pay | Admitting: Surgery

## 2021-09-27 DIAGNOSIS — M19012 Primary osteoarthritis, left shoulder: Secondary | ICD-10-CM | POA: Diagnosis not present

## 2021-09-27 DIAGNOSIS — Z01812 Encounter for preprocedural laboratory examination: Secondary | ICD-10-CM | POA: Insufficient documentation

## 2021-09-27 DIAGNOSIS — M7582 Other shoulder lesions, left shoulder: Secondary | ICD-10-CM | POA: Diagnosis not present

## 2021-09-27 DIAGNOSIS — Z20822 Contact with and (suspected) exposure to covid-19: Secondary | ICD-10-CM | POA: Insufficient documentation

## 2021-09-27 LAB — SARS CORONAVIRUS 2 (TAT 6-24 HRS): SARS Coronavirus 2: NEGATIVE

## 2021-09-27 MED ORDER — LEVOTHYROXINE SODIUM 100 MCG PO TABS: ORAL_TABLET | ORAL | 0 refills | Status: DC

## 2021-09-27 MED ORDER — LEVOTHYROXINE SODIUM 88 MCG PO TABS: ORAL_TABLET | ORAL | 0 refills | Status: DC

## 2021-09-27 NOTE — Telephone Encounter (Signed)
Patient called states that she needs refill on her Levothyroxine 88 and 100. Took last pill of both 3/9.  ? ?Advised about the refill request on her clobetasol. States that she has not been seen by GYN and her GYN does not ever want to see her again. She will not be calling her office for anything.  ? ?[facility] [address]  ?

## 2021-09-27 NOTE — Progress Notes (Signed)
CCM ([place] CCM Appointment) ? ?Patient cancelled CCM appointment on 10/02/2021 due to shoulder surgery scheduled on 10/01/2021. ? ?Charlene Brooke, CPP notified ? ?[name], RMA ?Clinical Pharmacy Assistant ?[phone] ? ? ?

## 2021-09-27 NOTE — Addendum Note (Signed)
Addended by: [name] on: 09/27/2021 05:38 PM ? ? Modules accepted: Orders ? ?

## 2021-09-27 NOTE — Telephone Encounter (Signed)
According to my prescription she should not be out of her levothyroxine. ? ?I contacted her pharmacy who stated that the manufacturer needed to be changed.  New prescription sent to pharmacy ?

## 2021-09-30 MED ORDER — ORAL CARE MOUTH RINSE: 15.0000 mL | Freq: Once | OROMUCOSAL | Status: AC

## 2021-09-30 MED ORDER — SODIUM CHLORIDE 0.9 % IV SOLN: INTRAVENOUS | Status: DC

## 2021-09-30 MED ORDER — CHLORHEXIDINE GLUCONATE 0.12 % MT SOLN: 15.0000 mL | Freq: Once | OROMUCOSAL | Status: AC

## 2021-09-30 MED ORDER — CEFAZOLIN SODIUM-DEXTROSE 2-4 GM/100ML-% IV SOLN
2.0000 g | INTRAVENOUS | Status: AC
  Administered 2021-10-01: 2 g via INTRAVENOUS

## 2021-09-30 MED ORDER — FAMOTIDINE 20 MG PO TABS: 20.0000 mg | ORAL_TABLET | Freq: Once | ORAL | Status: AC

## 2021-10-01 ENCOUNTER — Inpatient Hospital Stay: Payer: Medicare PPO

## 2021-10-01 ENCOUNTER — Encounter: Payer: Self-pay | Admitting: Surgery

## 2021-10-01 ENCOUNTER — Inpatient Hospital Stay
Admission: RE | Admit: 2021-10-01 | Discharge: 2021-10-03 | DRG: 483 | Disposition: A | Discharge status: Home Health | Payer: Medicare PPO | Attending: Internal Medicine | Admitting: Internal Medicine

## 2021-10-01 ENCOUNTER — Other Ambulatory Visit: Payer: Self-pay

## 2021-10-01 ENCOUNTER — Encounter
Admission: RE | Disposition: A | Discharge status: Home Health | Payer: Self-pay | Source: Home / Self Care | Attending: Surgery

## 2021-10-01 ENCOUNTER — Inpatient Hospital Stay: Payer: Medicare PPO | Admitting: Urgent Care

## 2021-10-01 DIAGNOSIS — E1151 Type 2 diabetes mellitus with diabetic peripheral angiopathy without gangrene: Secondary | ICD-10-CM | POA: Diagnosis present

## 2021-10-01 DIAGNOSIS — F1210 Cannabis abuse, uncomplicated: Secondary | ICD-10-CM | POA: Diagnosis present

## 2021-10-01 DIAGNOSIS — Z7984 Long term (current) use of oral hypoglycemic drugs: Secondary | ICD-10-CM | POA: Diagnosis not present

## 2021-10-01 DIAGNOSIS — F17200 Nicotine dependence, unspecified, uncomplicated: Secondary | ICD-10-CM | POA: Diagnosis not present

## 2021-10-01 DIAGNOSIS — Z96612 Presence of left artificial shoulder joint: Secondary | ICD-10-CM | POA: Diagnosis not present

## 2021-10-01 DIAGNOSIS — Z9889 Other specified postprocedural states: Secondary | ICD-10-CM | POA: Diagnosis not present

## 2021-10-01 DIAGNOSIS — Z90710 Acquired absence of both cervix and uterus: Secondary | ICD-10-CM

## 2021-10-01 DIAGNOSIS — Z800 Family history of malignant neoplasm of digestive organs: Secondary | ICD-10-CM | POA: Diagnosis not present

## 2021-10-01 DIAGNOSIS — J449 Chronic obstructive pulmonary disease, unspecified: Secondary | ICD-10-CM | POA: Diagnosis not present

## 2021-10-01 DIAGNOSIS — M19012 Primary osteoarthritis, left shoulder: Principal | ICD-10-CM | POA: Diagnosis present

## 2021-10-01 DIAGNOSIS — I447 Left bundle-branch block, unspecified: Secondary | ICD-10-CM | POA: Diagnosis present

## 2021-10-01 DIAGNOSIS — I130 Hypertensive heart and chronic kidney disease with heart failure and stage 1 through stage 4 chronic kidney disease, or unspecified chronic kidney disease: Secondary | ICD-10-CM | POA: Diagnosis not present

## 2021-10-01 DIAGNOSIS — Z808 Family history of malignant neoplasm of other organs or systems: Secondary | ICD-10-CM | POA: Diagnosis not present

## 2021-10-01 DIAGNOSIS — Z79890 Hormone replacement therapy (postmenopausal): Secondary | ICD-10-CM | POA: Diagnosis not present

## 2021-10-01 DIAGNOSIS — I2510 Atherosclerotic heart disease of native coronary artery without angina pectoris: Secondary | ICD-10-CM | POA: Diagnosis not present

## 2021-10-01 DIAGNOSIS — Z823 Family history of stroke: Secondary | ICD-10-CM

## 2021-10-01 DIAGNOSIS — E1122 Type 2 diabetes mellitus with diabetic chronic kidney disease: Secondary | ICD-10-CM | POA: Diagnosis not present

## 2021-10-01 DIAGNOSIS — E875 Hyperkalemia: Secondary | ICD-10-CM | POA: Diagnosis not present

## 2021-10-01 DIAGNOSIS — Z79899 Other long term (current) drug therapy: Secondary | ICD-10-CM

## 2021-10-01 DIAGNOSIS — R52 Pain, unspecified: Secondary | ICD-10-CM

## 2021-10-01 DIAGNOSIS — J810 Acute pulmonary edema: Secondary | ICD-10-CM | POA: Diagnosis not present

## 2021-10-01 DIAGNOSIS — M25512 Pain in left shoulder: Secondary | ICD-10-CM | POA: Diagnosis not present

## 2021-10-01 DIAGNOSIS — K219 Gastro-esophageal reflux disease without esophagitis: Secondary | ICD-10-CM | POA: Diagnosis present

## 2021-10-01 DIAGNOSIS — Z471 Aftercare following joint replacement surgery: Secondary | ICD-10-CM | POA: Diagnosis not present

## 2021-10-01 DIAGNOSIS — Z8249 Family history of ischemic heart disease and other diseases of the circulatory system: Secondary | ICD-10-CM

## 2021-10-01 DIAGNOSIS — Z7902 Long term (current) use of antithrombotics/antiplatelets: Secondary | ICD-10-CM

## 2021-10-01 DIAGNOSIS — I5031 Acute diastolic (congestive) heart failure: Secondary | ICD-10-CM | POA: Diagnosis not present

## 2021-10-01 DIAGNOSIS — J9811 Atelectasis: Secondary | ICD-10-CM | POA: Diagnosis present

## 2021-10-01 DIAGNOSIS — Z96642 Presence of left artificial hip joint: Secondary | ICD-10-CM | POA: Diagnosis not present

## 2021-10-01 DIAGNOSIS — E039 Hypothyroidism, unspecified: Secondary | ICD-10-CM | POA: Diagnosis not present

## 2021-10-01 DIAGNOSIS — R001 Bradycardia, unspecified: Secondary | ICD-10-CM | POA: Diagnosis present

## 2021-10-01 DIAGNOSIS — I469 Cardiac arrest, cause unspecified: Secondary | ICD-10-CM | POA: Diagnosis not present

## 2021-10-01 DIAGNOSIS — G8918 Other acute postprocedural pain: Secondary | ICD-10-CM | POA: Diagnosis not present

## 2021-10-01 DIAGNOSIS — E785 Hyperlipidemia, unspecified: Secondary | ICD-10-CM | POA: Diagnosis present

## 2021-10-01 DIAGNOSIS — I5033 Acute on chronic diastolic (congestive) heart failure: Secondary | ICD-10-CM | POA: Diagnosis not present

## 2021-10-01 DIAGNOSIS — Z955 Presence of coronary angioplasty implant and graft: Secondary | ICD-10-CM

## 2021-10-01 DIAGNOSIS — F17210 Nicotine dependence, cigarettes, uncomplicated: Secondary | ICD-10-CM | POA: Diagnosis present

## 2021-10-01 DIAGNOSIS — F0153 Vascular dementia, unspecified severity, with mood disturbance: Secondary | ICD-10-CM | POA: Diagnosis not present

## 2021-10-01 DIAGNOSIS — I5032 Chronic diastolic (congestive) heart failure: Secondary | ICD-10-CM | POA: Diagnosis present

## 2021-10-01 DIAGNOSIS — F32A Depression, unspecified: Secondary | ICD-10-CM | POA: Diagnosis present

## 2021-10-01 DIAGNOSIS — Z96619 Presence of unspecified artificial shoulder joint: Secondary | ICD-10-CM

## 2021-10-01 DIAGNOSIS — M7522 Bicipital tendinitis, left shoulder: Secondary | ICD-10-CM | POA: Diagnosis not present

## 2021-10-01 DIAGNOSIS — N1832 Chronic kidney disease, stage 3b: Secondary | ICD-10-CM | POA: Diagnosis not present

## 2021-10-01 DIAGNOSIS — Z833 Family history of diabetes mellitus: Secondary | ICD-10-CM

## 2021-10-01 DIAGNOSIS — I951 Orthostatic hypotension: Secondary | ICD-10-CM

## 2021-10-01 DIAGNOSIS — I252 Old myocardial infarction: Secondary | ICD-10-CM

## 2021-10-01 DIAGNOSIS — Z794 Long term (current) use of insulin: Secondary | ICD-10-CM | POA: Diagnosis present

## 2021-10-01 DIAGNOSIS — M7582 Other shoulder lesions, left shoulder: Secondary | ICD-10-CM | POA: Diagnosis not present

## 2021-10-01 DIAGNOSIS — Z96611 Presence of right artificial shoulder joint: Secondary | ICD-10-CM | POA: Insufficient documentation

## 2021-10-01 DIAGNOSIS — J9600 Acute respiratory failure, unspecified whether with hypoxia or hypercapnia: Secondary | ICD-10-CM | POA: Diagnosis not present

## 2021-10-01 DIAGNOSIS — I25118 Atherosclerotic heart disease of native coronary artery with other forms of angina pectoris: Secondary | ICD-10-CM | POA: Diagnosis not present

## 2021-10-01 DIAGNOSIS — J432 Centrilobular emphysema: Secondary | ICD-10-CM | POA: Diagnosis not present

## 2021-10-01 DIAGNOSIS — I493 Ventricular premature depolarization: Secondary | ICD-10-CM | POA: Diagnosis present

## 2021-10-01 DIAGNOSIS — N183 Chronic kidney disease, stage 3 unspecified: Secondary | ICD-10-CM | POA: Diagnosis present

## 2021-10-01 HISTORY — DX: Obstructive sleep apnea (adult) (pediatric): G47.33

## 2021-10-01 HISTORY — DX: Atherosclerosis of aorta: I70.0

## 2021-10-01 HISTORY — PX: REVERSE SHOULDER ARTHROPLASTY: SHX5054

## 2021-10-01 HISTORY — DX: Long term (current) use of antithrombotics/antiplatelets: Z79.02

## 2021-10-01 HISTORY — DX: Left bundle-branch block, unspecified: I44.7

## 2021-10-01 HISTORY — DX: Presence of coronary angioplasty implant and graft: Z95.5

## 2021-10-01 HISTORY — DX: Type 2 diabetes mellitus without complications: E11.9

## 2021-10-01 LAB — TROPONIN I (HIGH SENSITIVITY)
Troponin I (High Sensitivity): 23 ng/L — ABNORMAL HIGH (ref <18)
Troponin I (High Sensitivity): 41 ng/L — ABNORMAL HIGH (ref <18)

## 2021-10-01 LAB — POCT I-STAT, CHEM 8
BUN: 43 mg/dL — ABNORMAL HIGH (ref 8–23)
BUN: 41 mg/dL — ABNORMAL HIGH (ref 8–23)
Calcium, Ion: 1.16 mmol/L (ref 1.15–1.40)
Calcium, Ion: 1.11 mmol/L — ABNORMAL LOW (ref 1.15–1.40)
Chloride: 109 mmol/L (ref 98–111)
Chloride: 109 mmol/L (ref 98–111)
Creatinine, Ser: 1.5 mg/dL — ABNORMAL HIGH (ref 0.44–1.00)
Creatinine, Ser: 1.6 mg/dL — ABNORMAL HIGH (ref 0.44–1.00)
Glucose, Bld: 226 mg/dL — ABNORMAL HIGH (ref 70–99)
Glucose, Bld: 278 mg/dL — ABNORMAL HIGH (ref 70–99)
HCT: 39 % (ref 36.0–46.0)
HCT: 37 % (ref 36.0–46.0)
Hemoglobin: 13.3 g/dL (ref 12.0–15.0)
Hemoglobin: 12.6 g/dL (ref 12.0–15.0)
Potassium: 5.6 mmol/L — ABNORMAL HIGH (ref 3.5–5.1)
Potassium: 5.9 mmol/L — ABNORMAL HIGH (ref 3.5–5.1)
Sodium: 136 mmol/L (ref 135–145)
Sodium: 135 mmol/L (ref 135–145)
TCO2: 25 mmol/L (ref 22–32)
TCO2: 20 mmol/L — ABNORMAL LOW (ref 22–32)

## 2021-10-01 LAB — BASIC METABOLIC PANEL
Anion gap: 8 (ref 5–15)
Anion gap: 6 (ref 5–15)
BUN: 50 mg/dL — ABNORMAL HIGH (ref 8–23)
BUN: 44 mg/dL — ABNORMAL HIGH (ref 8–23)
CO2: 22 mmol/L (ref 22–32)
CO2: 20 mmol/L — ABNORMAL LOW (ref 22–32)
Calcium: 9.1 mg/dL (ref 8.9–10.3)
Calcium: 8.4 mg/dL — ABNORMAL LOW (ref 8.9–10.3)
Chloride: 105 mmol/L (ref 98–111)
Chloride: 106 mmol/L (ref 98–111)
Creatinine, Ser: 1.7 mg/dL — ABNORMAL HIGH (ref 0.44–1.00)
Creatinine, Ser: 1.45 mg/dL — ABNORMAL HIGH (ref 0.44–1.00)
GFR, Estimated: 31 mL/min — ABNORMAL LOW (ref >60)
GFR, Estimated: 38 mL/min — ABNORMAL LOW (ref >60)
Glucose, Bld: 226 mg/dL — ABNORMAL HIGH (ref 70–99)
Glucose, Bld: 228 mg/dL — ABNORMAL HIGH (ref 70–99)
Potassium: 5 mmol/L (ref 3.5–5.1)
Potassium: 5.6 mmol/L — ABNORMAL HIGH (ref 3.5–5.1)
Sodium: 135 mmol/L (ref 135–145)
Sodium: 132 mmol/L — ABNORMAL LOW (ref 135–145)

## 2021-10-01 LAB — GLUCOSE, CAPILLARY
Glucose-Capillary: 224 mg/dL — ABNORMAL HIGH (ref 70–99)
Glucose-Capillary: 212 mg/dL — ABNORMAL HIGH (ref 70–99)
Glucose-Capillary: 237 mg/dL — ABNORMAL HIGH (ref 70–99)
Glucose-Capillary: 295 mg/dL — ABNORMAL HIGH (ref 70–99)
Glucose-Capillary: 325 mg/dL — ABNORMAL HIGH (ref 70–99)

## 2021-10-01 LAB — TSH: TSH: 0.025 u[IU]/mL — ABNORMAL LOW (ref 0.350–4.500)

## 2021-10-01 LAB — ABO/RH: ABO/RH(D): O POS

## 2021-10-01 SURGERY — ARTHROPLASTY, SHOULDER, TOTAL, REVERSE
Anesthesia: General | Site: Shoulder | Laterality: Left

## 2021-10-01 MED ORDER — PHENYLEPHRINE HCL (PRESSORS) 10 MG/ML IV SOLN
INTRAVENOUS | Status: DC | PRN
  Administered 2021-10-01: 50 ug via INTRAVENOUS
  Administered 2021-10-01: 150 ug via INTRAVENOUS
  Administered 2021-10-01: 50 ug via INTRAVENOUS
  Administered 2021-10-01: 150 ug via INTRAVENOUS
  Administered 2021-10-01: 50 ug via INTRAVENOUS

## 2021-10-01 MED ORDER — GABAPENTIN 300 MG PO CAPS
300.0000 mg | ORAL_CAPSULE | Freq: Three times a day (TID) | ORAL | Status: DC
  Administered 2021-10-01 – 2021-10-03 (×5): 300 mg via ORAL
  Filled 2021-10-01 (×6): qty 1

## 2021-10-01 MED ORDER — CEFAZOLIN SODIUM-DEXTROSE 2-4 GM/100ML-% IV SOLN
2.0000 g | Freq: Four times a day (QID) | INTRAVENOUS | Status: AC
  Filled 2021-10-01: qty 100

## 2021-10-01 MED ORDER — INSULIN ASPART 100 UNIT/ML IJ SOLN
INTRAMUSCULAR | Status: AC
  Administered 2021-10-01: 7 [IU] via SUBCUTANEOUS
  Filled 2021-10-01: qty 1

## 2021-10-01 MED ORDER — IPRATROPIUM-ALBUTEROL 0.5-2.5 (3) MG/3ML IN SOLN: 3.0000 mL | Freq: Once | RESPIRATORY_TRACT | Status: DC

## 2021-10-01 MED ORDER — OXYCODONE HCL 5 MG PO TABS: 5.0000 mg | ORAL_TABLET | ORAL | 0 refills | Status: DC | PRN

## 2021-10-01 MED ORDER — NICOTINE 21 MG/24HR TD PT24
21.0000 mg | MEDICATED_PATCH | Freq: Every day | TRANSDERMAL | Status: DC
  Administered 2021-10-01 – 2021-10-03 (×3): 21 mg via TRANSDERMAL
  Filled 2021-10-01 (×3): qty 1

## 2021-10-01 MED ORDER — METOCLOPRAMIDE HCL 5 MG/ML IJ SOLN: 5.0000 mg | Freq: Three times a day (TID) | INTRAMUSCULAR | Status: DC | PRN

## 2021-10-01 MED ORDER — BUPIVACAINE LIPOSOME 1.3 % IJ SUSP
INTRAMUSCULAR | Status: DC | PRN
  Administered 2021-10-01: 10 mL via PERINEURAL

## 2021-10-01 MED ORDER — ALBUTEROL SULFATE HFA 108 (90 BASE) MCG/ACT IN AERS
2.0000 | INHALATION_SPRAY | Freq: Four times a day (QID) | RESPIRATORY_TRACT | Status: DC | PRN
  Filled 2021-10-01: qty 6.7

## 2021-10-01 MED ORDER — TRAZODONE HCL 100 MG PO TABS
100.0000 mg | ORAL_TABLET | Freq: Every evening | ORAL | Status: DC | PRN
  Administered 2021-10-03: 100 mg via ORAL
  Filled 2021-10-01 (×3): qty 1

## 2021-10-01 MED ORDER — ALBUTEROL SULFATE (2.5 MG/3ML) 0.083% IN NEBU
2.5000 mg | INHALATION_SOLUTION | Freq: Once | RESPIRATORY_TRACT | Status: AC
  Filled 2021-10-01: qty 3

## 2021-10-01 MED ORDER — 0.9 % SODIUM CHLORIDE (POUR BTL) OPTIME
TOPICAL | Status: DC | PRN
  Administered 2021-10-01: 500 mL

## 2021-10-01 MED ORDER — ACETAMINOPHEN 10 MG/ML IV SOLN
INTRAVENOUS | Status: DC | PRN
Start: 2021-10-01 — End: 2021-10-01
  Administered 2021-10-01: 1000 mg via INTRAVENOUS

## 2021-10-01 MED ORDER — INSULIN ASPART 100 UNIT/ML IJ SOLN
0.0000 [IU] | Freq: Three times a day (TID) | INTRAMUSCULAR | Status: DC
  Administered 2021-10-01: 5 [IU] via SUBCUTANEOUS
  Administered 2021-10-02: 11 [IU] via SUBCUTANEOUS
  Administered 2021-10-02: 5 [IU] via SUBCUTANEOUS
  Administered 2021-10-02: 3 [IU] via SUBCUTANEOUS
  Administered 2021-10-03: 5 [IU] via SUBCUTANEOUS
  Administered 2021-10-03: 8 [IU] via SUBCUTANEOUS
  Filled 2021-10-01 (×6): qty 1

## 2021-10-01 MED ORDER — CEFAZOLIN SODIUM-DEXTROSE 2-4 GM/100ML-% IV SOLN
2.0000 g | Freq: Four times a day (QID) | INTRAVENOUS | Status: AC
  Administered 2021-10-01: 2 g via INTRAVENOUS
  Filled 2021-10-01: qty 100

## 2021-10-01 MED ORDER — ACETAMINOPHEN 10 MG/ML IV SOLN: 1000.0000 mg | Freq: Once | INTRAVENOUS | Status: DC | PRN

## 2021-10-01 MED ORDER — IPRATROPIUM-ALBUTEROL 0.5-2.5 (3) MG/3ML IN SOLN
RESPIRATORY_TRACT | Status: AC
Start: 2021-10-01 — End: 2021-10-01
  Filled 2021-10-01: qty 3

## 2021-10-01 MED ORDER — ROSUVASTATIN CALCIUM 20 MG PO TABS
40.0000 mg | ORAL_TABLET | Freq: Every evening | ORAL | Status: DC
  Administered 2021-10-01 – 2021-10-02 (×2): 40 mg via ORAL
  Filled 2021-10-01 (×3): qty 2

## 2021-10-01 MED ORDER — CHLORHEXIDINE GLUCONATE 0.12 % MT SOLN
OROMUCOSAL | Status: AC
  Administered 2021-10-01: 15 mL via OROMUCOSAL
  Filled 2021-10-01: qty 15

## 2021-10-01 MED ORDER — BUPIVACAINE LIPOSOME 1.3 % IJ SUSP
INTRAMUSCULAR | Status: AC
  Filled 2021-10-01: qty 10

## 2021-10-01 MED ORDER — PHENYLEPHRINE 40 MCG/ML (10ML) SYRINGE FOR IV PUSH (FOR BLOOD PRESSURE SUPPORT): PREFILLED_SYRINGE | INTRAVENOUS | Status: DC | PRN

## 2021-10-01 MED ORDER — INSULIN ASPART 100 UNIT/ML IJ SOLN: 10.0000 [IU] | Freq: Once | INTRAMUSCULAR | Status: DC

## 2021-10-01 MED ORDER — MIDAZOLAM HCL 2 MG/2ML IJ SOLN
2.0000 mg | Freq: Once | INTRAMUSCULAR | Status: AC
Start: 2021-10-01 — End: 2021-10-01

## 2021-10-01 MED ORDER — ISOSORBIDE MONONITRATE ER 30 MG PO TB24
30.0000 mg | ORAL_TABLET | Freq: Every day | ORAL | Status: DC
  Administered 2021-10-02 – 2021-10-03 (×2): 30 mg via ORAL
  Filled 2021-10-01 (×2): qty 1

## 2021-10-01 MED ORDER — ROCURONIUM BROMIDE 100 MG/10ML IV SOLN
INTRAVENOUS | Status: DC | PRN
  Administered 2021-10-01: 60 mg via INTRAVENOUS
  Administered 2021-10-01: 40 mg via INTRAVENOUS
  Administered 2021-10-01: 50 mg via INTRAVENOUS

## 2021-10-01 MED ORDER — BUPIVACAINE LIPOSOME 1.3 % IJ SUSP
INTRAMUSCULAR | Status: AC
  Filled 2021-10-01: qty 20

## 2021-10-01 MED ORDER — INSULIN ASPART 100 UNIT/ML IJ SOLN: 5.0000 [IU] | Freq: Once | INTRAMUSCULAR | Status: AC

## 2021-10-01 MED ORDER — ONDANSETRON HCL 4 MG/2ML IJ SOLN
INTRAMUSCULAR | Status: AC
  Filled 2021-10-01: qty 2

## 2021-10-01 MED ORDER — ONDANSETRON HCL 4 MG PO TABS: 4.0000 mg | ORAL_TABLET | Freq: Four times a day (QID) | ORAL | Status: DC | PRN

## 2021-10-01 MED ORDER — TRANEXAMIC ACID 1000 MG/10ML IV SOLN
INTRAVENOUS | Status: DC | PRN
  Administered 2021-10-01: 1000 mg via TOPICAL

## 2021-10-01 MED ORDER — ONDANSETRON HCL 4 MG/2ML IJ SOLN
INTRAMUSCULAR | Status: DC | PRN
  Administered 2021-10-01: 4 mg via INTRAVENOUS

## 2021-10-01 MED ORDER — FUROSEMIDE 10 MG/ML IJ SOLN
40.0000 mg | Freq: Every day | INTRAMUSCULAR | Status: DC
Start: 2021-10-02 — End: 2021-10-02
  Administered 2021-10-02: 40 mg via INTRAVENOUS
  Filled 2021-10-01: qty 4

## 2021-10-01 MED ORDER — ROCURONIUM BROMIDE 10 MG/ML (PF) SYRINGE
PREFILLED_SYRINGE | INTRAVENOUS | Status: AC
  Filled 2021-10-01: qty 20

## 2021-10-01 MED ORDER — AMITRIPTYLINE HCL 50 MG PO TABS
50.0000 mg | ORAL_TABLET | Freq: Every evening | ORAL | Status: DC | PRN
  Filled 2021-10-01: qty 1

## 2021-10-01 MED ORDER — ACETAMINOPHEN 10 MG/ML IV SOLN
INTRAVENOUS | Status: AC
  Filled 2021-10-01: qty 100

## 2021-10-01 MED ORDER — FUROSEMIDE 10 MG/ML IJ SOLN
40.0000 mg | Freq: Once | INTRAMUSCULAR | Status: AC
  Administered 2021-10-01: 40 mg via INTRAVENOUS

## 2021-10-01 MED ORDER — ONDANSETRON HCL 4 MG/2ML IJ SOLN: 4.0000 mg | Freq: Four times a day (QID) | INTRAMUSCULAR | Status: DC | PRN

## 2021-10-01 MED ORDER — FUROSEMIDE 10 MG/ML IJ SOLN
INTRAMUSCULAR | Status: AC
  Filled 2021-10-01: qty 4

## 2021-10-01 MED ORDER — MIDAZOLAM HCL 2 MG/2ML IJ SOLN
INTRAMUSCULAR | Status: AC
  Filled 2021-10-01: qty 2

## 2021-10-01 MED ORDER — FENTANYL CITRATE (PF) 100 MCG/2ML IJ SOLN
INTRAMUSCULAR | Status: DC | PRN
  Administered 2021-10-01 (×2): 50 ug via INTRAVENOUS

## 2021-10-01 MED ORDER — DEXAMETHASONE SODIUM PHOSPHATE 10 MG/ML IJ SOLN
INTRAMUSCULAR | Status: AC
  Filled 2021-10-01: qty 1

## 2021-10-01 MED ORDER — OXYCODONE HCL 5 MG/5ML PO SOLN: 5.0000 mg | Freq: Once | ORAL | Status: DC | PRN

## 2021-10-01 MED ORDER — KETOROLAC TROMETHAMINE 15 MG/ML IJ SOLN
7.5000 mg | Freq: Four times a day (QID) | INTRAMUSCULAR | Status: AC
  Administered 2021-10-02: 7.5 mg via INTRAVENOUS
  Filled 2021-10-01 (×2): qty 1

## 2021-10-01 MED ORDER — DROPERIDOL 2.5 MG/ML IJ SOLN
0.6250 mg | Freq: Once | INTRAMUSCULAR | Status: DC | PRN
  Filled 2021-10-01: qty 2

## 2021-10-01 MED ORDER — ALBUTEROL SULFATE (2.5 MG/3ML) 0.083% IN NEBU
INHALATION_SOLUTION | RESPIRATORY_TRACT | Status: AC
  Filled 2021-10-01: qty 3

## 2021-10-01 MED ORDER — LIDOCAINE HCL (PF) 1 % IJ SOLN
INTRAMUSCULAR | Status: DC | PRN
  Administered 2021-10-01: 1 mL

## 2021-10-01 MED ORDER — SUGAMMADEX SODIUM 200 MG/2ML IV SOLN
INTRAVENOUS | Status: DC | PRN
  Administered 2021-10-01: 200 mg via INTRAVENOUS
  Administered 2021-10-01: 75 mg via INTRAVENOUS
  Administered 2021-10-01: 125 mg via INTRAVENOUS

## 2021-10-01 MED ORDER — CEFAZOLIN SODIUM-DEXTROSE 2-4 GM/100ML-% IV SOLN
INTRAVENOUS | Status: AC
  Filled 2021-10-01: qty 100

## 2021-10-01 MED ORDER — BUPIVACAINE HCL (PF) 0.5 % IJ SOLN
INTRAMUSCULAR | Status: DC | PRN
  Administered 2021-10-01: 10 mL via PERINEURAL

## 2021-10-01 MED ORDER — METOCLOPRAMIDE HCL 10 MG PO TABS
5.0000 mg | ORAL_TABLET | Freq: Three times a day (TID) | ORAL | Status: DC | PRN
  Administered 2021-10-02: 10 mg via ORAL
  Filled 2021-10-01: qty 1

## 2021-10-01 MED ORDER — SODIUM CHLORIDE 0.9 % IV SOLN: 250.0000 mL | INTRAVENOUS | Status: DC | PRN

## 2021-10-01 MED ORDER — PHENYLEPHRINE HCL-NACL 20-0.9 MG/250ML-% IV SOLN
INTRAVENOUS | Status: DC | PRN
  Administered 2021-10-01: 30 ug/min via INTRAVENOUS

## 2021-10-01 MED ORDER — BUPIVACAINE HCL (PF) 0.5 % IJ SOLN
INTRAMUSCULAR | Status: AC
  Filled 2021-10-01: qty 10

## 2021-10-01 MED ORDER — ALBUTEROL SULFATE (2.5 MG/3ML) 0.083% IN NEBU
INHALATION_SOLUTION | RESPIRATORY_TRACT | Status: AC
  Administered 2021-10-01: 2.5 mg via RESPIRATORY_TRACT
  Filled 2021-10-01: qty 3

## 2021-10-01 MED ORDER — MIDAZOLAM HCL 2 MG/2ML IJ SOLN
INTRAMUSCULAR | Status: DC | PRN
Start: 2021-10-01 — End: 2021-10-01
  Administered 2021-10-01: 1 mg via INTRAVENOUS

## 2021-10-01 MED ORDER — ONDANSETRON HCL 4 MG PO TABS
4.0000 mg | ORAL_TABLET | Freq: Four times a day (QID) | ORAL | Status: DC | PRN
  Administered 2021-10-02: 4 mg via ORAL
  Filled 2021-10-01: qty 1

## 2021-10-01 MED ORDER — EZETIMIBE 10 MG PO TABS
10.0000 mg | ORAL_TABLET | Freq: Every day | ORAL | Status: DC
  Administered 2021-10-02 – 2021-10-03 (×2): 10 mg via ORAL
  Filled 2021-10-01 (×3): qty 1

## 2021-10-01 MED ORDER — INSULIN ASPART 100 UNIT/ML IV SOLN
10.0000 [IU] | Freq: Once | INTRAVENOUS | Status: AC
  Administered 2021-10-01: 10 [IU] via INTRAVENOUS
  Filled 2021-10-01: qty 0.1

## 2021-10-01 MED ORDER — PROPOFOL 10 MG/ML IV BOLUS
INTRAVENOUS | Status: AC
  Filled 2021-10-01: qty 20

## 2021-10-01 MED ORDER — ONDANSETRON HCL 4 MG/2ML IJ SOLN
4.0000 mg | Freq: Four times a day (QID) | INTRAMUSCULAR | Status: DC | PRN
  Administered 2021-10-02: 4 mg via INTRAVENOUS
  Filled 2021-10-01: qty 2

## 2021-10-01 MED ORDER — ROCURONIUM BROMIDE 10 MG/ML (PF) SYRINGE
PREFILLED_SYRINGE | INTRAVENOUS | Status: AC
  Filled 2021-10-01: qty 10

## 2021-10-01 MED ORDER — SODIUM ZIRCONIUM CYCLOSILICATE 10 G PO PACK
10.0000 g | PACK | Freq: Every day | ORAL | Status: DC
  Administered 2021-10-02: 10 g via ORAL
  Filled 2021-10-01 (×2): qty 1

## 2021-10-01 MED ORDER — DEXTROSE 50 % IV SOLN
25.0000 g | Freq: Once | INTRAVENOUS | Status: AC
  Administered 2021-10-01: 25 g via INTRAVENOUS
  Filled 2021-10-01: qty 50

## 2021-10-01 MED ORDER — OXYCODONE HCL 5 MG PO TABS: 5.0000 mg | ORAL_TABLET | Freq: Once | ORAL | Status: DC | PRN

## 2021-10-01 MED ORDER — CEFAZOLIN SODIUM-DEXTROSE 2-4 GM/100ML-% IV SOLN: 2.0000 g | Freq: Two times a day (BID) | INTRAVENOUS | Status: DC

## 2021-10-01 MED ORDER — BUPIVACAINE LIPOSOME 1.3 % IJ SUSP
INTRAMUSCULAR | Status: DC | PRN
  Administered 2021-10-01: 40 mL

## 2021-10-01 MED ORDER — LIDOCAINE HCL (PF) 1 % IJ SOLN
INTRAMUSCULAR | Status: AC
Start: 2021-10-01 — End: 2021-10-01
  Filled 2021-10-01: qty 5

## 2021-10-01 MED ORDER — LIDOCAINE HCL 4 % EX SOLN
CUTANEOUS | Status: DC | PRN
  Administered 2021-10-01: 3 mL via TOPICAL

## 2021-10-01 MED ORDER — DEXAMETHASONE SODIUM PHOSPHATE 10 MG/ML IJ SOLN
INTRAMUSCULAR | Status: DC | PRN
  Administered 2021-10-01: 5 mg via INTRAVENOUS

## 2021-10-01 MED ORDER — SODIUM CHLORIDE 0.9 % IR SOLN
Status: DC | PRN
  Administered 2021-10-01: 3000 mL

## 2021-10-01 MED ORDER — INSULIN ASPART 100 UNIT/ML IJ SOLN: 7.0000 [IU] | Freq: Once | INTRAMUSCULAR | Status: AC

## 2021-10-01 MED ORDER — SODIUM CHLORIDE 0.9% FLUSH
3.0000 mL | INTRAVENOUS | Status: DC | PRN
  Administered 2021-10-01 (×2): 3 mL via INTRAVENOUS

## 2021-10-01 MED ORDER — MIDAZOLAM HCL 2 MG/2ML IJ SOLN
INTRAMUSCULAR | Status: AC
  Administered 2021-10-01: 1 mg via INTRAVENOUS
  Filled 2021-10-01: qty 2

## 2021-10-01 MED ORDER — INSULIN ASPART 100 UNIT/ML IV SOLN
10.0000 [IU] | Freq: Once | INTRAVENOUS | Status: DC
  Filled 2021-10-01: qty 0.1

## 2021-10-01 MED ORDER — LEVOTHYROXINE SODIUM 100 MCG PO TABS: 100.0000 ug | ORAL_TABLET | ORAL | Status: DC

## 2021-10-01 MED ORDER — NOVOLOG FLEXPEN 100 UNIT/ML ~~LOC~~ SOPN: 10.0000 [IU] | PEN_INJECTOR | Freq: Two times a day (BID) | SUBCUTANEOUS | Status: DC

## 2021-10-01 MED ORDER — FENTANYL CITRATE (PF) 100 MCG/2ML IJ SOLN
INTRAMUSCULAR | Status: AC
  Filled 2021-10-01: qty 2

## 2021-10-01 MED ORDER — KETOROLAC TROMETHAMINE 15 MG/ML IJ SOLN
INTRAMUSCULAR | Status: AC
  Administered 2021-10-01: 15 mg via INTRAVENOUS
  Filled 2021-10-01: qty 1

## 2021-10-01 MED ORDER — KETOROLAC TROMETHAMINE 15 MG/ML IJ SOLN
15.0000 mg | Freq: Once | INTRAMUSCULAR | Status: AC
Start: 2021-10-01 — End: 2021-10-01

## 2021-10-01 MED ORDER — SODIUM CHLORIDE 0.9% FLUSH
3.0000 mL | Freq: Two times a day (BID) | INTRAVENOUS | Status: DC
  Administered 2021-10-01 – 2021-10-03 (×4): 3 mL via INTRAVENOUS

## 2021-10-01 MED ORDER — FAMOTIDINE 20 MG PO TABS
ORAL_TABLET | ORAL | Status: AC
  Administered 2021-10-01: 20 mg via ORAL
  Filled 2021-10-01: qty 1

## 2021-10-01 MED ORDER — SODIUM CHLORIDE FLUSH 0.9 % IV SOLN
INTRAVENOUS | Status: AC
  Filled 2021-10-01: qty 40

## 2021-10-01 MED ORDER — PROPOFOL 10 MG/ML IV BOLUS
INTRAVENOUS | Status: DC | PRN
  Administered 2021-10-01: 100 mg via INTRAVENOUS

## 2021-10-01 MED ORDER — OXYCODONE HCL 5 MG PO TABS
5.0000 mg | ORAL_TABLET | ORAL | Status: DC | PRN
  Administered 2021-10-02 – 2021-10-03 (×4): 5 mg via ORAL
  Filled 2021-10-01 (×5): qty 1

## 2021-10-01 MED ORDER — INSULIN ASPART 100 UNIT/ML IJ SOLN
INTRAMUSCULAR | Status: AC
  Administered 2021-10-01: 5 [IU] via SUBCUTANEOUS
  Filled 2021-10-01: qty 1

## 2021-10-01 MED ORDER — LANTUS SOLOSTAR 100 UNIT/ML ~~LOC~~ SOPN: 60.0000 [IU] | PEN_INJECTOR | Freq: Every day | SUBCUTANEOUS | Status: DC

## 2021-10-01 MED ORDER — INSULIN GLARGINE-YFGN 100 UNIT/ML ~~LOC~~ SOLN
30.0000 [IU] | Freq: Every day | SUBCUTANEOUS | Status: DC
  Administered 2021-10-01: 30 [IU] via SUBCUTANEOUS
  Filled 2021-10-01 (×2): qty 0.3

## 2021-10-01 MED ORDER — STERILE WATER FOR IRRIGATION IR SOLN
Status: DC | PRN
  Administered 2021-10-01: 1000 mL

## 2021-10-01 MED ORDER — SODIUM CHLORIDE 0.9 % IV SOLN: INTRAVENOUS | Status: DC

## 2021-10-01 MED ORDER — PHENYLEPHRINE HCL-NACL 20-0.9 MG/250ML-% IV SOLN
INTRAVENOUS | Status: AC
  Filled 2021-10-01: qty 250

## 2021-10-01 MED ORDER — BUPIVACAINE-EPINEPHRINE (PF) 0.5% -1:200000 IJ SOLN
INTRAMUSCULAR | Status: AC
  Filled 2021-10-01: qty 30

## 2021-10-01 MED ORDER — FENTANYL CITRATE (PF) 100 MCG/2ML IJ SOLN: 25.0000 ug | INTRAMUSCULAR | Status: DC | PRN

## 2021-10-01 MED ORDER — TRANEXAMIC ACID 1000 MG/10ML IV SOLN
INTRAVENOUS | Status: AC
  Filled 2021-10-01: qty 10

## 2021-10-01 SURGICAL SUPPLY — 70 items
BASEPLATE BOSS DRILL (MISCELLANEOUS) ×1 IMPLANT
BIT DRILL 2.5 (BIT) ×2
BIT DRILL 2.5X4.5XSCR (BIT) IMPLANT
BIT DRILL F/BASEPLATE CENTRAL (BIT) IMPLANT
BIT DRL 2.5X4.5XSCR (BIT) ×1
BLADE SAW SAG 25X90X1.19 (BLADE) ×2 IMPLANT
CHLORAPREP W/TINT 26 (MISCELLANEOUS) ×2 IMPLANT
COOLER POLAR GLACIER W/PUMP (MISCELLANEOUS) ×2 IMPLANT
COVER BACK TABLE REUSABLE LG (DRAPES) ×2 IMPLANT
DRAPE 3/4 80X56 (DRAPES) ×2 IMPLANT
DRAPE INCISE IOBAN 66X45 STRL (DRAPES) ×2 IMPLANT
DRILL BASEPLATE CENTRAL  S (BIT) ×2
DRILL BASEPLATE CENTRAL S (BIT) ×1
DRSG OPSITE POSTOP 4X8 (GAUZE/BANDAGES/DRESSINGS) ×2 IMPLANT
ELECT BLADE 6.5 EXT (BLADE) IMPLANT
ELECT CAUTERY BLADE 6.4 (BLADE) ×2 IMPLANT
ELECT REM PT RETURN 9FT ADLT (ELECTROSURGICAL) ×2
ELECTRODE REM PT RTRN 9FT ADLT (ELECTROSURGICAL) ×1 IMPLANT
GAUZE XEROFORM 1X8 LF (GAUZE/BANDAGES/DRESSINGS) ×2 IMPLANT
GLENOSPHERE RSS 2 CONCENTRIC (Shoulder) ×1 IMPLANT
GLOVE SRG 8 PF TXTR STRL LF DI (GLOVE) ×2 IMPLANT
GLOVE SURG ENC MOIS LTX SZ7.5 (GLOVE) ×8 IMPLANT
GLOVE SURG ENC MOIS LTX SZ8 (GLOVE) ×8 IMPLANT
GLOVE SURG UNDER LTX SZ8 (GLOVE) ×2 IMPLANT
GLOVE SURG UNDER POLY LF SZ8 (GLOVE) ×4
GOWN STRL REUS W/ TWL LRG LVL3 (GOWN DISPOSABLE) ×1 IMPLANT
GOWN STRL REUS W/ TWL XL LVL3 (GOWN DISPOSABLE) ×1 IMPLANT
GOWN STRL REUS W/TWL LRG LVL3 (GOWN DISPOSABLE) ×2
GOWN STRL REUS W/TWL XL LVL3 (GOWN DISPOSABLE) ×2
GUIDE PIN 2.0 X 150 (WIRE) ×1 IMPLANT
IV NS IRRIG 3000ML ARTHROMATIC (IV SOLUTION) ×2 IMPLANT
KIT STABILIZATION SHOULDER (MISCELLANEOUS) ×2 IMPLANT
KIT TURNOVER KIT A (KITS) ×2 IMPLANT
LINER STD +0S RSS HXL (Liner) ×1 IMPLANT
MANIFOLD NEPTUNE II (INSTRUMENTS) ×2 IMPLANT
MASK FACE SPIDER DISP (MASK) ×2 IMPLANT
MAT ABSORB  FLUID 56X50 GRAY (MISCELLANEOUS) ×2
MAT ABSORB FLUID 56X50 GRAY (MISCELLANEOUS) ×1 IMPLANT
NDL MAYO CATGUT SZ1 (NEEDLE) IMPLANT
NDL SAFETY ECLIPSE 18X1.5 (NEEDLE) ×1 IMPLANT
NDL SPNL 20GX3.5 QUINCKE YW (NEEDLE) ×1 IMPLANT
NEEDLE HYPO 18GX1.5 SHARP (NEEDLE) ×2
NEEDLE MAYO CATGUT SZ1 (NEEDLE) IMPLANT
NEEDLE SPNL 20GX3.5 QUINCKE YW (NEEDLE) ×2 IMPLANT
NS IRRIG 500ML POUR BTL (IV SOLUTION) ×2 IMPLANT
PACK ARTHROSCOPY SHOULDER (MISCELLANEOUS) ×2 IMPLANT
PAD ARMBOARD 7.5X6 YLW CONV (MISCELLANEOUS) ×2 IMPLANT
PAD WRAPON POLAR SHDR UNIV (MISCELLANEOUS) ×1 IMPLANT
PLATE BASE REVERSE RSS S (Plate) ×1 IMPLANT
PULSAVAC PLUS IRRIG FAN TIP (DISPOSABLE) ×2
SCREW 4.5X15 RSS W CAP (Screw) ×2 IMPLANT
SCREW 4.5X20 RSS W CAP (Screw) ×1 IMPLANT
SCREW 4.5X25 RSS W CAP (Screw) ×1 IMPLANT
SCREW 4.5X35 RSS W CAP (Screw) ×1 IMPLANT
SCREW BODY REVERSE STD (Screw) ×1 IMPLANT
SLING ULTRA II M (MISCELLANEOUS) ×1 IMPLANT
SPONGE T-LAP 18X18 ~~LOC~~+RFID (SPONGE) ×4 IMPLANT
STAPLER SKIN PROX 35W (STAPLE) ×2 IMPLANT
STEM PRESS FIT SZ 09 TSS (Stem) ×1 IMPLANT
SUT ETHIBOND 0 MO6 C/R (SUTURE) ×2 IMPLANT
SUT FIBERWIRE #2 38 BLUE 1/2 (SUTURE) ×6
SUT VIC AB 0 CT1 36 (SUTURE) ×2 IMPLANT
SUT VIC AB 2-0 CT1 27 (SUTURE) ×4
SUT VIC AB 2-0 CT1 TAPERPNT 27 (SUTURE) ×2 IMPLANT
SUTURE FIBERWR #2 38 BLUE 1/2 (SUTURE) ×4 IMPLANT
SYR 10ML LL (SYRINGE) ×2 IMPLANT
SYR 30ML LL (SYRINGE) ×2 IMPLANT
TIP FAN IRRIG PULSAVAC PLUS (DISPOSABLE) ×1 IMPLANT
WATER STERILE IRR 500ML POUR (IV SOLUTION) ×2 IMPLANT
WRAPON POLAR PAD SHDR UNIV (MISCELLANEOUS) ×2

## 2021-10-01 NOTE — H&P (Signed)
History of Present Illness:  ?Sarah Payne is a 76 y.o. female who presents today as a result of a referral from Rachelle Hora, PA-C, for left shoulder pain and stiffness.  ? ?The patient's symptoms began many years ago and developed without any specific cause or injury. However, her symptoms worsened about 4 months ago. 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 Therefore, the patient was referred to orthopedics. She saw Rachelle Hora, PA-C, in November, 2022 who performed a glenohumeral injection which she states has provided moderate relief of her symptoms, but only lasted a few weeks. Therefore, the patient was sent for an MRI scan and referred to me for further evaluation and treatment.  ? ?The patient describes the symptoms as marked (major pain with significant limitations) and have the quality of being aching, nagging, miserable, stabbing and throbbing. The pain is localized to the lateral arm/shoulder and localized to the anterior shoulder. These symptoms are aggravated constantly, with normal daily activities, with sleeping, with overhead activity, reaching behind the back, getting dressed and activity in general. She has tried non-steroidal anti-inflammatories (Advil) and Gabapentin with no significant benefit. She has tried rest with no significant benefit. She has tried the glenohumeral injection described above which she states provided only short-term partial relief of her symptoms. The patient denies any neck pain, nor does she note any numbness or paresthesias down her arm to her hand. She has under gone prior treatment for her left shoulder, including an arthroscopic debridement of "bone spurs" about 8 to 10 years ago. She is right-handed. ? ?This complaint is not work related. She is a sports non-participant. ? ?Shoulder Surgical History:  ?The patient has had a decompression in the past. ? ?PMH/PSH/Family  History/Social History/Meds/Allergies:  ?I have reviewed past medical, surgical, social and family history, medications and allergies as documented in the EMR. ? ?Current Outpatient Medications: ? albuterol 90 mcg/actuation inhaler Inhale into the lungs Inhale 2 puffs into the lungs every 6 (six) hours as needed for wheezing or shortness of breath  ? aspirin 81 MG EC tablet Take by mouth.  ? atorvastatin (LIPITOR) 80 MG tablet Take 80 mg by mouth once daily.  ? blood glucose diagnostic test strip Check blood sugar 5 times daily to check blood sugars - Uncontrolled diabetes mellitus  ? blood-glucose meter (ACCU-CHEK NANO) Misc 1 each by XX route as directed. 1 each 0  ? calcium and magnesium carbonat (MYLANTA) 311-232 mg per tablet Take 1 tablet by mouth once daily.  ? carvedilol (COREG) 3.125 MG tablet Take 6.25 mg by mouth 2 (two) times daily with meals.  ? carvedilol (COREG) 6.25 MG tablet Take 6.25 mg by mouth 2 (two) times daily with meals.  ? clopidogrel (PLAVIX) 75 mg tablet Take 75 mg by mouth once daily.  ? cyanocobalamin (VITAMIN B12) 1,000 mcg/mL injection Inject 1,000 mcg into the muscle monthly. Reported on 07/09/2015 ? DULoxetine (CYMBALTA) 60 MG DR capsule Take 60 mg by mouth once daily.  ? enalapril (VASOTEC) 10 MG tablet Take 10 mg by mouth once daily.  ? ezetimibe (ZETIA) 10 mg tablet Take by mouth Take 1 tablet (10 mg total) by mouth daily. For cholesterol.  ? FREESTYLE LIBRE 2 READER reader USE WITH SENSOR TO CHECK BLOOD SUGARS 6 TIMES DAILY  ? FREESTYLE LIBRE 2 SENSOR kit USE TO CHECK SUGARS 6 TIMES DAILY  ? FUROsemide (LASIX) 20 MG tablet TAKE ONE TABLET BY MOUTH ONLY  ON MONDAY, [place], AND FRIDAY  ? gabapentin (NEURONTIN) 100 MG capsule Take 100 mg by mouth 3 (three) times daily as needed.  ? glipiZIDE (GLUCOTROL) 10 MG tablet Take 5 mg Before breakfast and supper  ? insulin ASPART (NOVOLOG FLEXPEN) 100 unit/mL pen injector TAKE 12 UNITS THREE TIMES DAILY BEFORE MEALS. 15 mL 5  ? insulin  GLARGINE (LANTUS SOLOSTAR) 100 unit/mL pen injector TAKE 40 UNITS EVERY NIGHT BETWEEN 8-10 PM 45 mL 5  ? isosorbide mononitrate (IMDUR) 30 MG ER tablet Take 30 mg by mouth once daily.  ? levothyroxine (SYNTHROID, LEVOTHROID) 175 MCG tablet Take 1 tablet (175 mcg total) by mouth once daily. Take on an empty stomach with a glass of water at least 30-60 minutes before breakfast. 30 tablet 5  ? meloxicam (MOBIC) 15 MG tablet Take 15 mg by mouth once daily  ? methocarbamol (ROBAXIN) 500 MG tablet Take 1 or 2 tabs by mouth TID prn 60 tablet 5  ? oxyCODONE-acetaminophen (PERCOCET) 5-325 mg tablet Take 1 tab by mouth bid prn 60 tablet 0  ? pantoprazole (PROTONIX) 40 MG DR tablet Take 40 mg by mouth.  ? pen needle, diabetic (PEN NEEDLE) 32 gauge x 5/32" Ndle Use one needle four times daily 100 each 5  ? pen needle, diabetic 32 gauge x 5/32" Ndle Use to inject insulin 4 times daily.  ? potassium chloride (KLOR-CON) 10 mEq ER tablet ONLY TAKE ON DAYS YOU TAKE YOUR LASIX. TAKE ONE TABLET BY MOUTH FOR 1 DOSE ON THOSE DAYS YOU TAKE YOUR LASIX TABLET  ? ranitidine (ZANTAC) 150 MG tablet Take 150 mg by mouth 2 (two) times daily.  ? rosuvastatin (CRESTOR) 40 MG tablet Take by mouth  ? sitaGLIPtin (JANUVIA) 100 MG tablet Take by mouth.  ? spironolactone (ALDACTONE) 25 MG tablet Take 1 tablet by mouth once daily  ? traZODone (DESYREL) 100 MG tablet Take 100 mg by mouth at bedtime as needed  ? ?Allergies: ?No Known Allergies ? ?Past Medical History:  ? CAD (coronary artery disease)  ? Depression  ? GERD (gastroesophageal reflux disease)  ? Heart attack (CMS-HCC) x6  ? Heart block  ? History of NSAID-associated gastropathy (severe, acute)  ? Hyperlipidemia  ? Hypertension  ? Hypothyroidism  ? Ingrowing right great toenail  ? Insomnia  ? Low back pain  ? Myocardial infarction (CMS-HCC) x3  ? Neuropathy  ? Onychocryptosis (right 1st and 2nd toes)   ? Tobacco abuse  ? Type 2 diabetes mellitus (CMS-HCC)  ? Vascular dementia (CMS-HCC)   ? ?Past Surgical History:  ? Debridement of toenail 01/28/2002 (right great toe)  ? Total nail avulsions of 1st and 2nd toes followed by Phenol and alcohol matrixectomy 03/25/2002  ? EGD 12/01/2006 (diffuse gastritis consistent with bile reflux gastritis; biopsies of gastric body negative for Helicobacter pylori)  ? CATARACT EXTRACTION July 2016  ? APPENDECTOMY  ? back surgery (lumbar spine)  ? cardiac cath  ? coronary angioplasty (stents x 3)  ? EXCISION VAGINAL CYST/TUMOR  ? HYSTERECTOMY  ? Muscle alignment surgery  ? ?Family History:  ? Myocardial Infarction (Heart attack) Mother  ? Heart disease Mother  ? Stroke Mother  ? Diabetes Mother  ? High blood pressure (Hypertension) Mother  ? Hypothyroidism Mother  ? Heart disease Father  ? Dementia Father  ? Throat cancer Brother  ? Diabetes Brother  ? Brain cancer Sister  ? Diabetes Sister  ? Cancer Brother  ? Thyroid disease Other  ? Neuropathy Other  ?  Obesity Other  ? Arthritis Other  ? ?Social History:  ? ?Socioeconomic History:  ? Marital status: Married  ?Tobacco Use  ? Smoking status: Every Day  ?Packs/day: 0.75  ?Types: Cigarettes  ? Smokeless tobacco: Never  ?Substance and Sexual Activity  ? Alcohol use: No  ?Alcohol/week: 0.0 standard drinks  ? Drug use: Yes  ?Frequency: 1.0 times per week  ?Types: Marijuana  ? ?Review of Systems:  ?A comprehensive 14 point ROS was performed, reviewed, and the pertinent orthopaedic findings are documented in the HPI. ? ?Physical Exam:  ?Vitals:  ?09/16/21 0925  ?BP: 124/84  ?Weight: 65.8 kg (145 lb)  ?Height: 168.9 cm (5' 6.5")  ?PainSc: 10-Worst pain ever  ?PainLoc: Shoulder  ? ?General/Constitutional: The patient appears to be well-nourished, well-developed, and in no acute distress. ?Neuro/Psych: Normal mood and affect, oriented to person, place and time. ?Eyes: Non-icteric. Pupils are equal, round, and reactive to light, and exhibit synchronous movement. ?ENT: Unremarkable. ?Lymphatic: No palpable  adenopathy. ?Respiratory: Lungs clear to auscultation, Normal chest excursion, No wheezes and Non-labored breathing ?Cardiovascular: Regular rate and rhythm. No murmurs. and No edema, swelling or tenderness, except as noted in detailed exam.

## 2021-10-01 NOTE — Anesthesia Procedure Notes (Signed)
Procedure Name: Intubation ?Date/Time: 10/01/2021 10:46 AM ?Performed by: Fredderick Phenix, CRNA ?Pre-anesthesia Checklist: Patient identified, Emergency Drugs available, Suction available and Patient being monitored ?Patient Re-evaluated:Patient Re-evaluated prior to induction ?Oxygen Delivery Method: Circle system utilized ?Preoxygenation: Pre-oxygenation with 100% oxygen ?Induction Type: IV induction ?Ventilation: Mask ventilation without difficulty ?Laryngoscope Size: McGraph and 3 ?Grade View: Grade I ?Tube type: Oral ?Tube size: 7.0 mm ?Number of attempts: 1 ?Airway Equipment and Method: Stylet and Oral airway ?Placement Confirmation: ETT inserted through vocal cords under direct vision, positive ETCO2 and breath sounds checked- equal and bilateral ?Secured at: 21 cm ?Tube secured with: Tape ?Dental Injury: Teeth and Oropharynx as per pre-operative assessment  ?Comments: Placed by Lucita Ferrara, SRNA under supervision of CRNA and MDA ? ? ? ? ?

## 2021-10-01 NOTE — H&P (Addendum)
?History and Physical  ? ? ?Patient: Sarah Payne:[id number] DOB: 12/01/1945 ?DOA: 10/01/2021 ?DOS: the patient was seen and examined on 10/01/2021 ?PCP: [name], NP  ?Patient coming from: Home ? ?Chief Complaint: No chief complaint on file. ? ?HPI: Sarah Payne is a 76 y.o. female with medical history significant for coronary artery disease status post PCI with stent angioplasty, history of COPD, nicotine dependence, GERD, depression who is status post reverse left total shoulder arthroplasty with biceps tenodesis. ?Postop complaint of shortness of breath and went into respiratory distress.  She received a dose of DuoNeb without any improvement and then became lethargic and unable to follow commands.  Bag mask ventilation with an oral airway was initiated and patient also received Suggamadex to reverse possible respiratory failure from persistent muscle paralysis.  She did regain consciousness and has been weaned down to 2 L of oxygen via nasal cannula. ?Patient was seen in PACU and states that she developed shortness of breath but denies having any chest pain, no chest pressure, no nausea, no vomiting or diaphoresis. ?She has a history of heart failure but did not take any of her medications prior to surgery. ?She is awake and alert and on the monitor is noted to have heart rate in the 40s. ?She denies having any abdominal pain, no urinary symptoms, no changes in her bowel habits, no dizziness, no leg headedness, no headache, no fever, no cough, no chills. ?Stat labs revealed hyperkalemia and patient received insulin, bronchodilator therapy and furosemide while in PACU. ?Review of Systems: As mentioned in the history of present illness. All other systems reviewed and are negative. ?Past Medical History:  ?Diagnosis Date  ? Aortic atherosclerosis ([place])   ? Bilateral cataracts 01/2015  ? a.) s/p extraction  ? CHF (congestive heart failure) ([place]) 09/16/2013  ? a.) TTE 09/16/2013: EF 55-60%; mod inferior  HK, triv TR; G1DD. b.) TTE 01/02/2016: EF 25-30%; mid-apicalateroseptal, lat, inf, inferoseptal, apical akinesis; triv TR; G1DD. c. TTE 04/16/2016: EF 65%; G1DD. d.) TTE 05/02/2020: EF 50-55%; G2DD. e.) TTE 04/11/2021: EF 55%; G1DD.  ? Chronic low back pain   ? Clostridioides difficile infection 2015  ? COPD (chronic obstructive pulmonary disease) ([place])   ? Coronary artery disease   ? a.) PCI 02/06/2011: 100% mLCx - DES x 3. b.) PCI 09/17/2011: 95% dRCA - DES x 1. c.) PCI 10/02/2011: 75% pLCX - DES x 1. d.) PCI 08/19/2012: DES x 2 p-mRCA.  e.) Bernie 01/01/16: Takotsubo event 01/01/2016 with patent stents  ? Depression   ? Diverticulosis   ? Frequent PVCs   ? a. Noted in hospital 12/2015.  ? GERD (gastroesophageal reflux disease)   ? History of heart artery stent   ? a.) TOTAL of 7 stents: a.) 02/06/2011 - overlapping 2.5x12 mm Xience V, 2.5x8 mm Xience V, 2.25x8 mm Xience Nano to mLCx. b.) 09/17/2011 - 2.5x23 Xience V dRCA. c.) 10/02/2011 - 2.5x15 mm Xience V pLCx. d.) 08/09/2012 - overlapping 3.0x99m mRCA and 3.5x224mpRCA  ? Hyperlipidemia   ? Hypertension   ? Hypothyroidism   ? Impingement syndrome of left shoulder   ? LBBB (left bundle branch block)   ? Long term current use of antithrombotics/antiplatelets   ? a.) clopidogrel  ? Marijuana abuse   ? [facility]  ? a.) multiple MIs;  5 per her report  ? OSA (obstructive sleep apnea)   ? a.) mild; does not require nocturnal PAP therapy  ? Paroxysmal SVT (supraventricular  tachycardia) ([place]) 05/08/2021  ? a.)  Zio patch 05/08/2021: 3 distinct SVT runs; fastest 5 beats at a rate of 146 bpm; longest 7 beats at rate of 134 bpm  ? T2DM (type 2 diabetes mellitus) ([place])   ? Takotsubo cardiomyopathy   ? a. 12/2015 - nephew committed suicide 1 week prior, sister died the morning of presentation - initially called a STEMI; cath with patent stents. LVEF 25-30%.  ? Tendonitis of left rotator cuff   ? Tobacco abuse   ? Vascular dementia   ? ?Past Surgical History:   ?Procedure Laterality Date  ? ABDOMINAL AORTOGRAM W/LOWER EXTREMITY N/A 09/12/2020  ? Procedure: ABDOMINAL AORTOGRAM W/LOWER EXTREMITY;  Surgeon: Wellington Hampshire, MD;  Location: [place] [facility];  Service: Cardiovascular;  Laterality: N/A;  ? ABDOMINAL HYSTERECTOMY    ? APPENDECTOMY    ? BACK SURGERY    ? CARDIAC CATHETERIZATION N/A 01/01/2016  ? Procedure: Left Heart Cath and Coronary Angiography;  Surgeon: Jettie Booze, MD;  Location: [place] [facility];  Service: Cardiovascular;  Laterality: N/A;  ? CATARACT EXTRACTION W/PHACO Left 01/30/2015  ? Procedure: CATARACT EXTRACTION PHACO AND INTRAOCULAR LENS PLACEMENT (IOC);  Surgeon: Birder Robson, MD;  Location: [facility];  Service: Ophthalmology;  Laterality: Left;  [place] 00:47 ?  ? CATARACT EXTRACTION W/PHACO Right 02/13/2015  ? Procedure: CATARACT EXTRACTION PHACO AND INTRAOCULAR LENS PLACEMENT (IOC);  Surgeon: Birder Robson, MD;  Location: [facility];  Service: Ophthalmology;  Laterality: Right;  cassette lot # [id number] H [place]  00:29.9 AP  20.7 CDE  6.20  ? COLONOSCOPY N/A 11/02/2014  ? Procedure: COLONOSCOPY;  Surgeon: Inda Castle, MD;  Location: [place];  Service: Endoscopy;  Laterality: N/A;  ? CORONARY ANGIOPLASTY WITH STENT PLACEMENT Left 02/06/2011  ? Procedure: CORONARY ANGIOPLASTY WITH STENT PLACEMENT; [place]: [place]; Surgeon: Katrine Coho, MD  ? CORONARY ANGIOPLASTY WITH STENT PLACEMENT Left 09/17/2011  ? Procedure: CORONARY ANGIOPLASTY WITH STENT PLACEMENT; Location: [place]; Surgeon: Katrine Coho, MD  ? CORONARY ANGIOPLASTY WITH STENT PLACEMENT Left 10/02/2011  ? Procedure: CORONARY ANGIOPLASTY WITH STENT PLACEMENT; Location: [place]; Surgeon: Katrine Coho, MD  ? CORONARY ANGIOPLASTY WITH STENT PLACEMENT Left 08/19/2012  ? Procedure: CORONARY ANGIOPLASTY WITH STENT PLACEMENT; Location: [place]; Surgeon: Kathlyn Sacramento, MD  ? ESOPHAGOGASTRODUODENOSCOPY (EGD) WITH PROPOFOL N/A 09/21/2018  ? Procedure: ESOPHAGOGASTRODUODENOSCOPY (EGD)  WITH PROPOFOL;  Surgeon: [name], MD;  Location[facility];  Service: Gastroenterology;  Laterality: N/A;  ? LEFT HEART CATH AND CORONARY ANGIOGRAPHY Left 08/02/2014  ? Procedure: LEFT HEART CATH AND CORONARY ANGIOGRAPHY; Location: [place]; Surgeon: Glenetta Hew, MD  ? PERIPHERAL VASCULAR ATHERECTOMY Left 09/12/2020  ? Procedure: PERIPHERAL VASCULAR ATHERECTOMY;  Surgeon: Wellington Hampshire, MD;  Location: [place] [facility];  Service: Cardiovascular;  Laterality: Left;  ? PERIPHERAL VASCULAR BALLOON ANGIOPLASTY  09/12/2020  ? Procedure: PERIPHERAL VASCULAR BALLOON ANGIOPLASTY;  Surgeon: Wellington Hampshire, MD;  Location: [place] [facility];  Service: Cardiovascular;;  ? SHOULDER SURGERY Left 2017  ? ?Social History:  reports that she has been smoking cigarettes. She has a 45.00 pack-year smoking history. She has never used smokeless tobacco. She reports current drug use. Drug: Marijuana. She reports that she does not drink alcohol. ? ?No Known Allergies ? ?Family History  ?Problem Relation Age of Onset  ? Heart attack Mother   ?     First MI @ 27 - Died @ 3  ? Heart disease Mother   ? Heart disease Father   ?  Died @ 37  ? Throat cancer Brother   ? Liver cancer Brother   ? Colon cancer Sister   ? ? ?Prior to Admission medications   ?Medication Sig Start Date End Date Taking? Authorizing Provider  ?amitriptyline (ELAVIL) 50 MG tablet Take 1 tablet (50 mg total) by mouth at bedtime as needed for sleep. 07/09/21 01/05/22 Yes Gillis Santa, MD  ?carvedilol (COREG) 3.125 MG tablet Take 1 tablet (3.125 mg total) by mouth 2 (two) times daily. 06/12/21  Yes Minna Merritts, MD  ?clopidogrel (PLAVIX) 75 MG tablet Take 1 tablet (75 mg total) by mouth daily. 04/12/21  Yes Visser, Jacquelyn D, PA-C  ?enalapril (VASOTEC) 10 MG tablet TAKE 1 TABLET BY MOUTH ONCE DAILY 06/12/21  Yes Gollan, Kathlene November, MD  ?ezetimibe (ZETIA) 10 MG tablet Take 1 tablet (10 mg total) by mouth daily. For cholesterol. 04/12/21  Yes  [name] D, PA-C  ?furosemide (LASIX) 20 MG tablet Take one tab only on Monday, Wednesday, & Fridays 06/12/21  Yes Gollan, Kathlene November, MD  ?gabapentin (NEURONTIN) 300 MG capsule Take 1 capsule (300 mg

## 2021-10-01 NOTE — Assessment & Plan Note (Addendum)
-   TSH suppressed, 0.025 ?- Free T4 elevated, 2.92 ?- Follow-up total T3 (148) ?- Currently taking Synthroid 100 mcg on Sunday, Monday, Tuesday and 88 mcg the other 4 days ?- decrease and simplify regimen; I discussed this in great detail with patient.  ?- starting Synthroid 75 mg daily (weekly dose is less than prior weekly dose) ?- repeat TSH and FT4 in 4-6 weeks ?

## 2021-10-01 NOTE — Assessment & Plan Note (Signed)
Not acutely exacerbated ?Continue PRN bronchodilator therapy as well as inhaled steroids ?

## 2021-10-01 NOTE — Anesthesia Preprocedure Evaluation (Addendum)
Anesthesia Evaluation  ?Patient identified by MRN, date of birth, ID band ?Patient awake ? ? ? ?Reviewed: ?Allergy & Precautions, NPO status , Patient's Chart, lab work & pertinent test results ? ?Airway ?Mallampati: III ? ?TM Distance: >3 FB ?Neck ROM: full ? ? ? Dental ? ?(+) Edentulous Upper, Edentulous Lower ?  ?Pulmonary ?sleep apnea , COPD,  COPD inhaler, Current Smoker and Patient abstained from smoking.,  ?  ?Pulmonary exam normal ? ? ? ? ? ? ? Cardiovascular ?Exercise Tolerance: Good ?hypertension, Pt. on medications ?+ CAD, + Past MI ( 5 per her report, last 2017) and +CHF (TTE 04/11/2021: EF 55%; G1DD)  ?Normal cardiovascular exam+ dysrhythmias (frequent PVCs, LBBB)  ? ? PCI 02/06/2011: 100% mLCx - DES x 3. b.) PCI 09/17/2011: 95% dRCA - DES x 1. c.) PCI 10/02/2011: 75% pLCX - DES x 1. d.) PCI 08/19/2012: DES x 2 p-mRCA.  e.) [place] 01/01/16: Takotsubo event 01/01/2016 with patent stents ? ?EKG Sinus rhythm with 1st degree A-V block ?Anteroseptal infarct (cited on or before 02-Jan-2016) ?Abnormal ECG ?When compared with ECG of 27-Feb-2020 10:09, ?Premature ventricular complexes are no longer Present ?Questionable change in QRS axis ?T wave inversion no longer evident in Lateral leads ?Confirmed by Belinda Fisher [phone]) on 09/24/2021 4:31:29 PM ?  ?Neuro/Psych ?PSYCHIATRIC DISORDERS (vascular dementia) Depression Dementia negative neurological ROS ?   ? GI/Hepatic ?GERD (pt report rare episodes of coughing with the sensation foreign material in chest. GERD is on her problem list. )  ,(+)  ?  ? substance abuse ? marijuana use,   ?Endo/Other  ?diabetes, Poorly Controlled, Type 2, Insulin DependentHypothyroidism  ? Renal/GU ?Renal InsufficiencyRenal disease  ? ?  ?Musculoskeletal ? ?(+) Arthritis , Osteoarthritis,   ? Abdominal ?Normal abdominal exam  (+)   ?Peds ? Hematology ?negative hematology ROS ?(+)   ?Anesthesia Other Findings ?Past Medical History: ?No date: Aortic  atherosclerosis ([place]) ?01/2015: Bilateral cataracts ?    Comment:  a.) s/p extraction ?09/16/2013: CHF (congestive heart failure) ([place]) ?    Comment:  a.) TTE 09/16/2013: EF 55-60%; mod inferior HK, triv TR; ?             G1DD. b.) TTE 01/02/2016: EF 25-30%;  ?             mid-apicalateroseptal, lat, inf, inferoseptal, apical  ?             akinesis; triv TR; G1DD. c. TTE 04/16/2016: EF 65%; G1DD. ?             d.) TTE 05/02/2020: EF 50-55%; G2DD. e.) TTE 04/11/2021:  ?             EF 55%; G1DD. ?No date: Chronic low back pain ?2015: Clostridioides difficile infection ?No date: COPD (chronic obstructive pulmonary disease) ([place]) ?No date: Coronary artery disease ?    Comment:  a.) PCI 02/06/2011: 100% mLCx - DES x 3. b.) PCI  ?             09/17/2011: 95% dRCA - DES x 1. c.) PCI 10/02/2011: 75%  ?             pLCX - DES x 1. d.) PCI 08/19/2012: DES x 2 p-mRCA.  e.)  ?             [facility] 01/01/16: Takotsubo event 01/01/2016 with patent stents ?No date: Depression ?No date: Diverticulosis ?No date: Frequent PVCs ?    Comment:  a. Noted in hospital 12/2015. ?No  date: GERD (gastroesophageal reflux disease) ?No date: History of heart artery stent ?    Comment:  a.) TOTAL of 7 stents: a.) 02/06/2011 - overlapping  ?             2.5x12 mm Xience V, 2.5x8 mm Xience V, 2.25x8 mm Xience  ?             [employer] to [employer]. b.) 09/17/2011 - 2.5x23 Xience V dRCA. c.)  ?             10/02/2011 - 2.5x15 mm Xience V pLCx. d.) 08/09/2012 -  ?             overlapping 3.0x32m mRCA and 3.5x247mpRCA ?No date: Hyperlipidemia ?No date: Hypertension ?No date: Hypothyroidism ?No date: Impingement syndrome of left shoulder ?No date: LBBB (left bundle branch block) ?No date: Long term current use of antithrombotics/antiplatelets ?    Comment:  a.) clopidogrel ?No date: Marijuana abuse ?[facility]    Comment:  a.) multiple MIs;  5 per her report ?No date: OSA (obstructive sleep apnea) ?    Comment:  a.) mild; does not require  nocturnal PAP therapy ?05/08/2021: Paroxysmal SVT (supraventricular tachycardia) ([facility]?    Comment:  a.)  Zio patch 05/08/2021: 3 distinct SVT runs; fastest  ?             5 beats at a rate of 146 bpm; longest 7 beats at rate of  ?             134 bpm ?No date: T2DM (type 2 diabetes mellitus) ([facility]?No date: Takotsubo cardiomyopathy ?    Comment:  a. 12/2015 - nephew committed suicide 1 week prior,  ?             sister died the morning of presentation - initially  ?             called a STEMI; cath with patent stents. LVEF 25-30%. ?No date: Tendonitis of left rotator cuff ?No date: Tobacco abuse ?No date: Vascular dementia ? ?Past Surgical History: ?09/12/2020: ABDOMINAL AORTOGRAM W/LOWER EXTREMITY; N/A ?    Comment:  Procedure: ABDOMINAL AORTOGRAM W/LOWER EXTREMITY;   ?             Surgeon: ArWellington HampshireMD;  [facility] ?             LAB;  Service: Cardiovascular;  Laterality: N/A; ?No date: ABDOMINAL HYSTERECTOMY ?No date: APPENDECTOMY ?No date: BACK SURGERY ?01/01/2016: CARDIAC CATHETERIZATION; N/A ?    Comment:  Procedure: Left Heart Cath and Coronary Angiography;   ?             Surgeon: JaJettie BoozeMD;  [facility]?             CV LAB;  Service: Cardiovascular;  Laterality: N/A; ?01/30/2015: CATARACT EXTRACTION W/PHACO; Left ?    Comment:  Procedure: CATARACT EXTRACTION PHACO AND INTRAOCULAR  ?             LENS PLACEMENT ([place]  Surgeon: WiBirder RobsonMD;   ?             Location: [facility];  Service: Ophthalmology;  Laterality: ?             Left;  [id number] ? ?02/13/2015: CATARACT EXTRACTION W/PHACO; Right ?    Comment:  Procedure: CATARACT EXTRACTION PHACO AND INTRAOCULAR  ?  LENS PLACEMENT ([facility]);  Surgeon: Birder Robson, MD;   ?             Location: [facility];  Service: Ophthalmology;  Laterality: ?             Right;  cassette lot # [id number] H [place]  00:29.9 AP  20.7 CDE  ?             6.20 ?11/02/2014: COLONOSCOPY; N/A ?    Comment:  Procedure:  COLONOSCOPY;  Surgeon: Inda Castle, MD;   ?             Location: [place];  Service: Endoscopy;  Laterality: ?             N/A; ?02/06/2011: CORONARY ANGIOPLASTY WITH STENT PLACEMENT; Left ?    Comment:  Procedure: CORONARY ANGIOPLASTY WITH STENT PLACEMENT;  ?             Location: [place]; Surgeon: Katrine Coho, MD ?09/17/2011: CORONARY ANGIOPLASTY WITH STENT PLACEMENT; Left ?    Comment:  Procedure: CORONARY ANGIOPLASTY WITH STENT PLACEMENT;  ?             Location: [place]; Surgeon: Katrine Coho, MD ?10/02/2011: CORONARY ANGIOPLASTY WITH STENT PLACEMENT; Left ?    Comment:  Procedure: CORONARY ANGIOPLASTY WITH STENT PLACEMENT;  ?             Location: [place]; Surgeon: Katrine Coho, MD ?08/19/2012: CORONARY ANGIOPLASTY WITH STENT PLACEMENT; Left ?    Comment:  Procedure: CORONARY ANGIOPLASTY WITH STENT PLACEMENT;  ?             Location: [place]; Surgeon: Kathlyn Sacramento, MD ?09/21/2018: ESOPHAGOGASTRODUODENOSCOPY (EGD) WITH PROPOFOL; N/A ?    Comment:  Procedure: ESOPHAGOGASTRODUODENOSCOPY (EGD) WITH  ?             PROPOFOL;  Surgeon: [name], MD;  Location: [facility]  ?             ENDOSCOPY;  Service: Gastroenterology;  Laterality: N/A; ?08/02/2014: LEFT HEART CATH AND CORONARY ANGIOGRAPHY; Left ?    Comment:  Procedure: LEFT HEART CATH AND CORONARY ANGIOGRAPHY;  ?             Location: [facility]; Surgeon: Glenetta Hew, MD ?09/12/2020: PERIPHERAL VASCULAR ATHERECTOMY; Left ?    Comment:  Procedure: PERIPHERAL VASCULAR ATHERECTOMY;  Surgeon:  ?             Wellington Hampshire, MD;  Location: [place] CV LAB;   ?             Service: Cardiovascular;  Laterality: Left; ?09/12/2020: PERIPHERAL VASCULAR BALLOON ANGIOPLASTY ?    Comment:  Procedure: PERIPHERAL VASCULAR BALLOON ANGIOPLASTY;   ?             Surgeon: Wellington Hampshire, MD;  Location: [place] CV ?             LAB;  Service: Cardiovascular;; ?2017: SHOULDER SURGERY; Left ? ? ? ? Reproductive/Obstetrics ?negative OB ROS ? ?   ? ? ? ? ? ? ? ? ? ? ? ? ? ?  ?  ? ? ? ? ?Anesthesia Physical ?Anesthesia Plan ? ?ASA: 3 ? ?Anesthesia Plan: General ETT  ? ?Post-op Pain Management: Regional block*  ? ?Induction: Intravenous ? ?PONV Risk Score and Plan: 2 and [place]

## 2021-10-01 NOTE — Discharge Instructions (Addendum)
Orthopedic discharge instructions: ?May shower with intact OpSite dressing once nerve block off. ?Apply ice frequently to shoulder or use Polar Care device. ?Take Mobic 15 mg daily OR ibuprofen 600-800 mg TID with meals for 5-7 days, then as necessary. ?Take oxycodone as prescribed when needed.  ?May supplement with ES Tylenol if necessary. ?May resume Plavix tomorrow morning. ?Keep shoulder immobilizer on at all times except may remove for bathing purposes and for exercises. ?Follow-up in 10-14 days or as scheduled. ?

## 2021-10-01 NOTE — Assessment & Plan Note (Addendum)
-   Last A1c 8.1% on 07/18/2021 ?- Continue SSI and CBG monitoring ?- Continue Levemir ? ?

## 2021-10-01 NOTE — Assessment & Plan Note (Addendum)
Most likely related to carvedilol use as well as hyperkalemia on admission ?-Still having intermittent PVCs.  Coreg continued per cardiology ?

## 2021-10-01 NOTE — Assessment & Plan Note (Addendum)
-   last echo 03/2021: EF 55%, Gr 1 DD. No RWMA, mild LVH. ?- currently on coreg, spironolactone, enalapril, imdur (and also complaining of "passing out when standing" at home for several months) ?- see orthostatic hypotension as well ?- CXR showed mild pulmonary edema with small bilateral pleural effusions and bibasilar atelectasis ?- s/p lasix on admission with good response  ?-Echo repeated during hospitalization: EF 55 to 58%, grade 1 diastolic dysfunction. ?

## 2021-10-01 NOTE — Op Note (Signed)
10/01/2021 ? ?1:11 PM ? ?Patient:   Sarah Payne ? 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 ? ?Post-Op Diagnosis:   Same. ? ?Procedure:   Reverse left total shoulder arthroplasty with biceps tenodesis. ? ?Surgeon:   Pascal Lux, MD ? ?Assistant:   Cameron Proud, PA-C ? ?Anesthesia:   General endotracheal with an interscalene block using Exparel placed preoperatively by the anesthesiologist. ? ?Findings:   As above. ? ?Complications:   None ? ?EBL:   75 cc ? ?Fluids:   1000 cc crystalloid ? ?UOP:   None ? ?TT:   None ? ?Drains:   None ? ?Closure:   Staples ? ?Implants:   All press-fit Integra system with a 9 mm stem, a standard metaphyseal body, a +0 mm humeral platform, a mini baseplate, and a 38 mm concentric +2 mm laterally offset glenosphere. ? ?Brief Clinical Note:   The patient is a 76 year old female with a long history of progressively worsening pain and stiffness of her left shoulder. Her symptoms have progressed despite medications, activity modification, etc. Her history and examination are consistent with advanced degenerative joint disease confirmed by plain radiographs. An MRI scan demonstrated rotator cuff tendinopathy with biceps tendinopathy. The patient presents at this time for a reverse left total shoulder arthroplasty. ? ?Procedure:   The patient underwent placement of an interscalene block using Exparel by the anesthesiologist in the preoperative holding area before being brought into the operating room and lain in the supine position. The patient then underwent general endotracheal intubation and anesthesia before the patient was repositioned in the beach chair position using the beach chair positioner. The left shoulder and upper extremity were prepped with ChloraPrep solution before being draped sterilely. Preoperative antibiotics were administered.  ? ?A timeout was performed to verify the appropriate  surgical site before a standard anterior approach to the shoulder was made through an approximately 4-5 inch incision. The incision was carried down through the subcutaneous tissues to expose the deltopectoral fascia. The interval between the deltoid and pectoralis muscles was identified and this plane developed, retracting the cephalic vein laterally with the deltoid muscle. The conjoined tendon was identified. Its lateral margin was dissected and the Kolbel self-retraining retractor inserted. The "three sisters" were identified and cauterized. Bursal tissues were removed to improve visualization.  ? ?The biceps tendon was identified near the inferior aspect of the bicipital groove. A soft tissue tenodesis was performed by attaching the biceps tendon to the adjacent pectoralis major tendon using two #0 Ethibond interrupted sutures. The biceps tendon was then transected just proximal to the tenodesis site. The subscapularis tendon was released from its attachment to the lesser tuberosity 1 cm proximal to its insertion and several tagging sutures placed. The inferior capsule was released with care after identifying and protecting the axillary nerve. The proximal humeral cut was made at approximately 20? of retroversion using the extra-medullary guide.  ? ?Attention was redirected to the glenoid. The labrum was debrided circumferentially before the center of the glenoid was identified. The guidewire was drilled into the glenoid neck using the appropriate guide. After verifying its position, it was overreamed with the mini-baseplate reamer to create a flat surface before the stem reamer was utilized. The superior and inferior peg sites were reamed using the appropriate guide to complete the glenoid preparation. The permanent mini-baseplate was impacted into place. It was stabilized with a 20 x 4.5 mm central screw and four peripheral  screws. Locking caps were placed over the superior and inferior screws. The permanent  38 mm concentric glenosphere with +2 mm of lateral offset was then impacted into place and its Morse taper locking mechanism verified using manual distraction. ? ?Attention was directed to the humeral side. The humeral canal was prepared utilizing the tapered stem reamers sequentially beginning with the 7 mm stem and progressing to a 9 mm stem. This demonstrated a good tight fit. The metaphyseal region was then prepared using the appropriate planar device. The trial stem and first the small and then the standard metaphyseal body were put together on the back table and a trial reduction performed using the +0 mm, +3 mm, and +6 mm inserts. With the +0 mm insert on the standard metaphyseal body, the arm demonstrated excellent range of motion as the hand could be brought across the chest to the opposite shoulder and brought to the top of the patient's head and to the patient's ear. The shoulder appeared stable throughout this range of motion. The joint was dislocated and the trial components removed. The permanent 9 mm stem with the standard body was impacted into place with care taken to maintain the appropriate version. A repeat trial reduction with the +0 mm insert again demonstrated excellent stability with the findings as described above. Therefore, the shoulder was re-dislocated and, after inserting the locking screw to secure the body to the stem, the permanent +0 mm insert impacted into place. After verifying its locking mechanism, the shoulder was relocated using two finger pressure and again placed through a range of motion with the findings as described above. ? ?The wound was copiously irrigated with sterile saline solution using the jet lavage system before a total of 10 cc of Exparel and 30 cc of 0.5% Sensorcaine with epinephrine was injected into the pericapsular and peri-incisional tissues to help with postoperative analgesia. The subscapularis tendon was reapproximated using #2 FiberWire interrupted  sutures. The deltopectoral interval was closed using #0 Vicryl interrupted sutures before the subcutaneous tissues were closed using 2-0 Vicryl interrupted sutures. The skin was closed using staples. Prior to closing the skin, 1 g of transexemic acid in 10 cc of normal saline was injected intra-articularly to help with postoperative bleeding. A sterile occlusive dressing was applied to the wound before the arm was placed into a shoulder immobilizer with an abduction pillow. A Polar Care system also was applied to the shoulder. The patient was then transferred back to a hospital bed before being awakened, extubated, and returned to the recovery room in satisfactory condition after tolerating the procedure well. ?

## 2021-10-01 NOTE — Anesthesia Postprocedure Evaluation (Addendum)
Anesthesia Post Note ? ?Patient: Sarah Payne ? ?Procedure(s) Performed: REVERSE SHOULDER ARTHROPLASTY WITH BICEPS TENODESIS. (Left: Shoulder) ? ?Patient location during evaluation: PACU ?Anesthesia Type: General ?Level of consciousness: awake and alert ?Pain management: pain level controlled ?Vital Signs Assessment: post-procedure vital signs reviewed and stable ?Respiratory status: spontaneous breathing, nonlabored ventilation, respiratory function stable and patient connected to nasal cannula oxygen ?Cardiovascular status: blood pressure returned to baseline and stable ?Postop Assessment: no apparent nausea or vomiting ?Anesthetic complications: yes (pacu respiratory failure, resolved with intervention) ?Comments: Pt decompensated in the pacu approximately 30 minutes after the procedure from respiratory failure. She had tachypnea early in the pacu stay on [place] O2 with saturations high 90's. She mouthed that she could not breath. On ascultation, she was moving air freely with no stridor or obstructive sounds in the trachea/larynx. Breathsounds to anterior chest were faint with wheezing. A DUONEB was administered with thoughts that she could have bronchoconstriction not allowing adequate movement of air. She became lethargic during the duoneb and stopped following commands. Bag-mask ventilation with an oral airway was initiated with preparation to intubate. In the meantime, suggamadex was administered to rule out respiratory failure from persistent muscle paralysis. She did regain consciousness and her respiratory rate normalized. An EKG was performed with a LBBB and T wave changes. Stat Chem-8 showed hyperkalemia to 5.6. A CXR showed mild pulmonary edema and atelectasis. Pt had no complaints once she was responsive and alert and oriented. Further cardiac ischemic work-up was not pursued. Supplemental oxygen was weaned to RA. Pt's hyperglycemia and hyperkalemia was treated with subcutaneous insulin. Dr. Roland Rack was  notified. A consult Dr. Francine Graven, Hospitalist, was made to manage and treat the patient's hyperkalemia and admit patient. '40mg'$  Furosemide was ordered to assist with hyperkalemia. Pt was discharged to pacu in stable condition.  ? ? ?No notable events documented. ? ? ?Last Vitals:  ?Vitals:  ? 10/01/21 1530 10/01/21 1545  ?BP: 117/62 (!) 110/59  ?Pulse: (!) 49 (!) 47  ?Resp: 14 19  ?Temp:    ?SpO2: 100% 99%  ?  ?Last Pain:  ?Vitals:  ? 10/01/21 1445  ?TempSrc:   ?PainSc: 0-No pain  ? ? ?  ?  ?  ?  ?  ?  ? ?Iran Ouch ? ? ? ? ?

## 2021-10-01 NOTE — Assessment & Plan Note (Addendum)
-   Continue trazodone and amitriptyline ?- if still orthostatic after BP regimen adjustment, would need to consider modifying amitriptyline next  ?

## 2021-10-01 NOTE — Assessment & Plan Note (Addendum)
-   Still smoking approximately 1 PPD and she endorses no desire to quit even though I again strongly recommended this which she understands is needed ?-Continue nicotine patch ?

## 2021-10-01 NOTE — Transfer of Care (Signed)
Immediate Anesthesia Transfer of Care Note ? ?Patient: Sarah Payne ? ?Procedure(s) Performed: REVERSE SHOULDER ARTHROPLASTY WITH BICEPS TENODESIS. (Left: Shoulder) ? ?Patient Location: PACU ? ?Anesthesia Type:General and Regional ? ?Level of Consciousness: awake and alert  ? ?Airway & Oxygen Therapy: Patient Spontanous Breathing and Patient connected to nasal cannula oxygen ? ?Post-op Assessment: Report given to RN and Post -op Vital signs reviewed and stable ? ?Post vital signs: Reviewed and stable ? ?Last Vitals:  ?Vitals Value Taken Time  ?BP 143/93 10/01/21 1318  ?Temp    ?Pulse 51 10/01/21 1322  ?Resp 18 10/01/21 1322  ?SpO2 95 % 10/01/21 1322  ?Vitals shown include unvalidated device data. ? ?Last Pain:  ?Vitals:  ? 10/01/21 0819  ?TempSrc: Oral  ?PainSc: 0-No pain  ?   ? ?  ? ?Complications: No notable events documented. ?

## 2021-10-01 NOTE — Assessment & Plan Note (Addendum)
-   Underwent total shoulder arthroplasty with biceps tenodesis on 10/01/2021 due to underlying osteoarthritis and rotator cuff tendinitis ?-Nonweightbearing to left arm per orthopedic surgery.  Sling in place ?-Continue pain control ?

## 2021-10-01 NOTE — Assessment & Plan Note (Addendum)
-  patient has history of CKD3b. Baseline creat ~ 1.3 - 1.4, eGFR 40 ? ?

## 2021-10-01 NOTE — Assessment & Plan Note (Addendum)
Patient has a known history of coronary artery disease status post PCI with stent angioplasty. ?- follow up cardiology consult; suspect may not need all of home regimen given orthostasis and syncope ?

## 2021-10-01 NOTE — Assessment & Plan Note (Addendum)
-   K peaked at 5.9 ?- probable mixed etiology (ACEi, sprionolactone, mild worsened renal function ?- responded well to treatment ?-Spironolactone discontinued at discharge per cardiology ?

## 2021-10-01 NOTE — Anesthesia Procedure Notes (Signed)
Anesthesia Regional Block: Interscalene brachial plexus block  ? ?Pre-Anesthetic Checklist: , timeout performed,  Correct Patient, Correct Site, Correct Laterality,  Correct Procedure, Correct Position, site marked,  Risks and benefits discussed,  Surgical consent,  Pre-op evaluation,  At surgeon's request and post-op pain management ? ?Laterality: Upper and Left ? ?Prep: chloraprep     ?  ?Needles:  ?Injection technique: Single-shot ? ?Needle Type: Stimiplex   ? ? ?Needle Length: 9cm  ?Needle Gauge: 22  ? ? ? ?Additional Needles: ? ? ?Procedures:,,,, ultrasound used (permanent image in chart),,    ?Narrative:  ?Start time: 10/01/2021 9:26 AM ?End time: 10/01/2021 9:27 AM ?Injection made incrementally with aspirations every 20 mL. ? ?Performed by: Personally  ?Anesthesiologist: Iran Ouch, MD ? ?Additional Notes: ?Patient consented for risk and benefits of nerve block including but not limited to nerve damage, failed block, bleeding and infection.  Patient voiced understanding. ? ?Functioning IV was confirmed and monitors were applied.  Timeout done prior to procedure and prior to any sedation being given to the patient.  Patient confirmed procedure site prior to any sedation given to the patient.  A 19m 22ga Stimuplex needle was used. Sterile prep,hand hygiene and sterile gloves were used.  Minimal sedation used for procedure.  No paresthesia endorsed by patient during the procedure.  Negative aspiration and negative test dose prior to incremental administration of local anesthetic. The patient tolerated the procedure well with no immediate complications. ? ? ? ?

## 2021-10-02 ENCOUNTER — Ambulatory Visit: Payer: Medicare PPO

## 2021-10-02 DIAGNOSIS — R001 Bradycardia, unspecified: Secondary | ICD-10-CM

## 2021-10-02 DIAGNOSIS — I951 Orthostatic hypotension: Secondary | ICD-10-CM | POA: Diagnosis not present

## 2021-10-02 DIAGNOSIS — I5033 Acute on chronic diastolic (congestive) heart failure: Secondary | ICD-10-CM

## 2021-10-02 DIAGNOSIS — F17200 Nicotine dependence, unspecified, uncomplicated: Secondary | ICD-10-CM

## 2021-10-02 DIAGNOSIS — I2510 Atherosclerotic heart disease of native coronary artery without angina pectoris: Secondary | ICD-10-CM

## 2021-10-02 DIAGNOSIS — E039 Hypothyroidism, unspecified: Secondary | ICD-10-CM

## 2021-10-02 DIAGNOSIS — E875 Hyperkalemia: Secondary | ICD-10-CM | POA: Diagnosis not present

## 2021-10-02 LAB — BASIC METABOLIC PANEL
Anion gap: 3 — ABNORMAL LOW (ref 5–15)
BUN: 45 mg/dL — ABNORMAL HIGH (ref 8–23)
CO2: 26 mmol/L (ref 22–32)
Calcium: 8.7 mg/dL — ABNORMAL LOW (ref 8.9–10.3)
Chloride: 103 mmol/L (ref 98–111)
Creatinine, Ser: 1.58 mg/dL — ABNORMAL HIGH (ref 0.44–1.00)
GFR, Estimated: 34 mL/min — ABNORMAL LOW (ref >60)
Glucose, Bld: 211 mg/dL — ABNORMAL HIGH (ref 70–99)
Potassium: 4.8 mmol/L (ref 3.5–5.1)
Sodium: 132 mmol/L — ABNORMAL LOW (ref 135–145)

## 2021-10-02 LAB — CBC
HCT: 35.2 % — ABNORMAL LOW (ref 36.0–46.0)
Hemoglobin: 11.8 g/dL — ABNORMAL LOW (ref 12.0–15.0)
MCH: 31.1 pg (ref 26.0–34.0)
MCHC: 33.5 g/dL (ref 30.0–36.0)
MCV: 92.6 fL (ref 80.0–100.0)
Platelets: 211 10*3/uL (ref 150–400)
RBC: 3.8 MIL/uL — ABNORMAL LOW (ref 3.87–5.11)
RDW: 12.5 % (ref 11.5–15.5)
WBC: 14.7 10*3/uL — ABNORMAL HIGH (ref 4.0–10.5)
nRBC: 0 % (ref 0.0–0.2)

## 2021-10-02 LAB — GLUCOSE, CAPILLARY
Glucose-Capillary: 168 mg/dL — ABNORMAL HIGH (ref 70–99)
Glucose-Capillary: 170 mg/dL — ABNORMAL HIGH (ref 70–99)
Glucose-Capillary: 344 mg/dL — ABNORMAL HIGH (ref 70–99)
Glucose-Capillary: 230 mg/dL — ABNORMAL HIGH (ref 70–99)
Glucose-Capillary: 182 mg/dL — ABNORMAL HIGH (ref 70–99)

## 2021-10-02 LAB — TROPONIN I (HIGH SENSITIVITY): Troponin I (High Sensitivity): 49 ng/L — ABNORMAL HIGH (ref <18)

## 2021-10-02 LAB — BRAIN NATRIURETIC PEPTIDE: B Natriuretic Peptide: 265.6 pg/mL — ABNORMAL HIGH (ref 0.0–100.0)

## 2021-10-02 LAB — T4, FREE: Free T4: 2.92 ng/dL — ABNORMAL HIGH (ref 0.61–1.12)

## 2021-10-02 MED ORDER — INSULIN GLARGINE-YFGN 100 UNIT/ML ~~LOC~~ SOLN
30.0000 [IU] | Freq: Every day | SUBCUTANEOUS | Status: DC
  Filled 2021-10-02 (×2): qty 0.3

## 2021-10-02 MED ORDER — LEVOTHYROXINE SODIUM 25 MCG PO TABS: 25.0000 ug | ORAL_TABLET | Freq: Every day | ORAL | Status: DC

## 2021-10-02 MED ORDER — FUROSEMIDE 40 MG PO TABS
40.0000 mg | ORAL_TABLET | Freq: Every day | ORAL | Status: DC
  Administered 2021-10-03: 40 mg via ORAL
  Filled 2021-10-02: qty 1

## 2021-10-02 MED ORDER — LEVOTHYROXINE SODIUM 50 MCG PO TABS
75.0000 ug | ORAL_TABLET | Freq: Every day | ORAL | Status: DC
  Administered 2021-10-03: 75 ug via ORAL
  Filled 2021-10-02: qty 1

## 2021-10-02 MED ORDER — CEFAZOLIN SODIUM-DEXTROSE 2-4 GM/100ML-% IV SOLN
2.0000 g | Freq: Once | INTRAVENOUS | Status: AC
  Administered 2021-10-02: 2 g via INTRAVENOUS
  Filled 2021-10-02: qty 100

## 2021-10-02 NOTE — TOC Progression Note (Signed)
Transition of Care (TOC) - Progression Note  ? ? ?Patient Details  ?Name: Sarah Payne ?MRN: [id number] ?Date of Birth: 10-Oct-1945 ? ?Transition of Care (TOC) CM/SW Contact  ?Conception Oms, RN ?Phone Number: ?10/02/2021, 10:13 AM ? ?Clinical Narrative:   The patient is set up with [facility] for [facility] services prior to surgery by surgeons office ? ? ? ?  ?  ? ?Expected Discharge Plan and Services ?  ?  ?  ?  ?  ?Expected Discharge Date: 10/01/21               ?  ?  ?  ?  ?  ?  ?  ?  ?  ?  ? ? ?Social Determinants of Health (SDOH) Interventions ?  ? ?Readmission Risk Interventions ?No flowsheet data found. ? ?

## 2021-10-02 NOTE — Hospital Course (Addendum)
Ms. Mcenery is a 76 yo female with PMH chronic dCHF, ongoing tobacco use, COPD, depression, GERD, HTN, HLD, hypothyroidism, OSA, DM II who presented to the hospital for left total shoulder arthroplasty with biceps tenodesis.  She had undergone outpatient work-up with orthopedic surgery which revealed osteoarthritis of the left shoulder and left rotator cuff tendinitis. ?Surgery was performed on 10/01/2021 and after surgery she developed acute onset of shortness of breath and respiratory distress.  She did not respond to a DuoNeb and became progressively more lethargic, unable to follow commands.  She was started on oxygen and given sugammadex to reverse any possible ongoing diaphragmatic muscle paralysis. ?Further work-up also revealed hyperkalemia, peaked at 5.9.  She was treated with insulin and also given Lasix for presumed acute volume overload.  A CXR was obtained which showed mild pulmonary edema with small bilateral pleural effusions and bibasilar atelectasis. ?She was admitted for further work-up and evaluation. ?

## 2021-10-02 NOTE — Addendum Note (Signed)
Addendum  created 10/02/21 0655 by Iran Ouch, MD  ? Clinical Note Signed  ?  ?

## 2021-10-02 NOTE — Assessment & Plan Note (Addendum)
-   Patient also complaining of episodes at home feeling dizzy and lightheaded when standing to the point of even becoming diaphoretic, nauseous, and vomiting ?- Does check blood pressure periodically but only when feeling well not during these episodes ?- She has had recent work-up in September 2022 for syncope as well; she was found to be orthostatic positive in September during check also; EtOH use was also felt to be contributing at that time.  Also has known history of prior severe LV dysfunction consistent with Takotsubo and last cath was 2017. ?-Repeat orthostatic blood pressure today is again positive ?-Has only received Imdur so far.  Likely still multiple antihypertensives contributing.  Follow-up cardiology evaluation for med adjustment ?-Meds adjusted at time of discharge per cardiology, appreciate assistance.  Continued on Coreg and Lasix ?

## 2021-10-02 NOTE — Consult Note (Signed)
? ?  Heart Failure Nurse Navigator Note ? ?HFpEF 55%.  Mild LVH.  Grade 1 diastolic dysfunction.  Echocardiogram performed on September 2022. ? ?Sarah Payne was noted after surgery a sudden onset of shortness of breath and respiratory distress.  She states that she remembers that she was getting into trouble and could not even get the words out to make anybody aware.  Chest x-ray showed mild pulmonary edema with small pleural effusions.  BNP 265. ? ?Comorbidities: ? ?Coronary artery disease with PCI ?Peripheral arterial disease ?Hyperlipidemia ?Hypertension ?Diabetes ?Stress cardiomyopathy ?COPD ?Continued tobacco abuse ?Hypothyroidism ?GERD ?She also has a history of syncope. ? ?Medications: ? ?Zetia 10 mg daily ?Furosemide 40 mg IV daily ?Isosorbide mononitrate 30 mg daily ?NicoDerm CQ patch daily ?Crestor 40 mg daily ? ?Home medications include Aldactone 25 mg daily, Coreg 3.125 mg twice daily, enalapril 10 mg daily, Plavix 75 mg daily. ? ?Initial meeting with Sarah Payne who is laying quietly in bed, daughter-in-law was at the bedside and daughter [name] who resides in [place] was on video phone. ? ?Discussed diastolic heart failure. ? ?Instructed on making changes with eating a low-sodium diet, removing the saltshaker from the table, also discussed foods to avoid. ? ?Instructed on daily weights, recording and what to report. ? ?Also discussed changes in symptoms to report. ? ?Explained the outpatient heart failure clinic for which she has an appointment on March 23 at 3 PM.  She has a 13% no-show ratio which is 30 out of 229 appointments. ? ?Also explained the [facility], they were all in agreement and referral form was filled out. ? ?She was given the living with heart failure teaching booklet, zone magnet and information on low sodium, at this time they had no further questions. ? ?Pricilla Riffle RN [facility] ?

## 2021-10-02 NOTE — Plan of Care (Signed)
?  Problem: Health Behavior/Discharge Planning: ?Goal: Ability to manage health-related needs will improve ?Outcome: Progressing ?  ?Problem: Clinical Measurements: ?Goal: Ability to maintain clinical measurements within normal limits will improve ?Outcome: Progressing ?Goal: Will remain free from infection ?Outcome: Progressing ?Goal: Diagnostic test results will improve ?Outcome: Progressing ?Goal: Respiratory complications will improve ?Outcome: Progressing ?Goal: Cardiovascular complication will be avoided ?Outcome: Progressing ?  ?Problem: Education: ?Goal: Knowledge of General Education information will improve ?Description: Including pain rating scale, medication(s)/side effects and non-pharmacologic comfort measures ?Outcome: Completed/Met ?  ?Problem: Activity: ?Goal: Risk for activity intolerance will decrease ?Outcome: Completed/Met ?  ?Problem: Nutrition: ?Goal: Adequate nutrition will be maintained ?Outcome: Completed/Met ?  ?Problem: Coping: ?Goal: Level of anxiety will decrease ?Outcome: Completed/Met ?  ?Problem: Elimination: ?Goal: Will not experience complications related to bowel motility ?Outcome: Completed/Met ?Goal: Will not experience complications related to urinary retention ?Outcome: Completed/Met ?  ?Problem: Pain Managment: ?Goal: General experience of comfort will improve ?Outcome: Completed/Met ?  ?Problem: Safety: ?Goal: Ability to remain free from injury will improve ?Outcome: Completed/Met ?  ?Problem: Skin Integrity: ?Goal: Risk for impaired skin integrity will decrease ?Outcome: Completed/Met ?  ?

## 2021-10-02 NOTE — Plan of Care (Signed)

## 2021-10-02 NOTE — Progress Notes (Signed)
?Progress Note ? ? ? ?Sarah Payne   ?FVC:[id number]  ?DOB: 23-Feb-1946  ?DOA: 10/01/2021     1 ?PCP: [name], NP ? ?Initial CC: respiratory distress ? ?Hospital Course: ?Sarah Payne is a 76 yo female with PMH chronic dCHF, ongoing tobacco use, COPD, depression, GERD, HTN, HLD, hypothyroidism, OSA, DM II who presented to the hospital for left total shoulder arthroplasty with biceps tenodesis.  She had undergone outpatient work-up with orthopedic surgery which revealed osteoarthritis of the left shoulder and left rotator cuff tendinitis. ?Surgery was performed on 10/01/2021 and after surgery she developed acute onset of shortness of breath and respiratory distress.  She did not respond to a DuoNeb and became progressively more lethargic, unable to follow commands.  She was started on oxygen and given sugammadex to reverse any possible ongoing diaphragmatic muscle paralysis. ?Further work-up also revealed hyperkalemia, peaked at 5.9.  She was treated with insulin and also given Lasix for presumed acute volume overload.  A CXR was obtained which showed mild pulmonary edema with small bilateral pleural effusions and bibasilar atelectasis. ?She was admitted for further work-up and evaluation. ? ?Interval History:  ?Seen this morning bedside with daughter present and other daughter on the phone.  Reviewed work-up thus far and ongoing plan for continued diuresis.  Family also requesting for cardiology evaluation while patient hospitalized.  Her breathing has dramatically improved and she is back down to room air.  I again discussed need for smoking cessation with patient and she understands the importance but unfortunately still has no desire to want to quit. ? ?Assessment and Plan: ?* Hyperkalemia ?- K peaked at 5.9 ?- probable mixed etiology (ACEi, sprionolactone, mild worsened renal function ?- responded well to treatment ?- would ideally like to d/c spironolactone but will await cardiology eval too ? ?Acute on  chronic diastolic CHF (congestive heart failure) ([place]) ?- last echo 03/2021: EF 55%, Gr 1 DD. No RWMA, mild LVH. ?- currently on coreg, spironolactone, enalapril, imdur (and also complaining of "passing out when standing" at home for several months) ?- see orthostatic hypotension as well ?- CXR showed mild pulmonary edema with small bilateral pleural effusions and bibasilar atelectasis ?- s/p lasix on admission with good response  ?- strict I&O ?- family requesting cardiology evaluation while patient hospitalized as well ?- flat trop trend; likely demand; follow up repeat EKG today ? ?Sinus bradycardia ?Most likely related to carvedilol use as well as hyperkalemia on admission ?- hold coreg; given normal EF, may not need BB going forward; follow up cardiology consult ? ?Orthostatic hypotension ?- Patient also complaining of episodes at home feeling dizzy and lightheaded when standing to the point of even becoming diaphoretic, nauseous, and vomiting ?- Does check blood pressure periodically but only when feeling well not during these episodes ?- She has had recent work-up in September 2022 for syncope as well; she was found to be orthostatic positive in September during check also; EtOH use was also felt to be contributing at that time.  Also has known history of prior severe LV dysfunction consistent with Takotsubo and last cath was 2017. ?-Repeat orthostatic blood pressure today is again positive ?-Has only received Imdur so far.  Likely still multiple antihypertensives contributing.  Follow-up cardiology evaluation for med adjustment ? ?S/p reverse total shoulder arthroplasty ?- Underwent total shoulder arthroplasty with biceps tenodesis on 10/01/2021 due to underlying osteoarthritis and rotator cuff tendinitis ?-Nonweightbearing to left arm per orthopedic surgery.  Sling in place ?-Continue pain control ? ?  Hypothyroidism ?- TSH suppressed, 0.025 ?- Free T4 elevated, 2.92 ?- Follow-up total T3 ?- Currently taking  Synthroid 100 mcg on Sunday, Monday, Tuesday ?- will clarify regimen with patient and her total weekly dosage will need to be lowered ?- repeat TSH in 4-6 weeks ? ?Diabetes mellitus type 2 with peripheral artery disease ([place]) ?- Last A1c 8.1% on 07/18/2021 ?- Continue SSI and CBG monitoring ?- Continue Levemir ? ? ?Depression ?- Continue trazodone and amitriptyline ?- if still orthostatic after BP regimen adjustment, would need to consider modifying amitriptyline next  ? ?Stage 3b chronic kidney disease (CKD) ([place]) ?- patient has history of CKD3b. Baseline creat ~ 1.3 - 1.4, eGFR 40 ? ? ?Tobacco dependence ?- Still smoking approximately 1 PPD and she endorses no desire to quit even though I again strongly recommended this which she understands is needed ?-Continue nicotine patch ? ?COPD (chronic obstructive pulmonary disease) ([place]) ?Not acutely exacerbated ?Continue PRN bronchodilator therapy as well as inhaled steroids ? ?CAD, multiple vessel ?Patient has a known history of coronary artery disease status post PCI with stent angioplasty. ?- follow up cardiology consult; suspect may not need all of home regimen given orthostasis and syncope ? ? ? ?Old records reviewed in assessment of this patient ? ?Antimicrobials: ? ? ?DVT prophylaxis:  ?SCDs Start: 10/01/21 1616 ? ? ?Code Status:   Code Status: Full Code ? ?Disposition Plan: Home likely tomorrow ?Status is: Inpatient ? ?Objective: ?Blood pressure (!) 143/82, pulse 84, temperature 98.1 ?F (36.7 ?C), resp. rate 18, height _0  (1.702 m), weight 65 kg, SpO2 99 %.  ?Examination:  ?Physical Exam ?Constitutional:   ?   General: She is not in acute distress. ?   Appearance: Normal appearance.  ?HENT:  ?   Head: Normocephalic and atraumatic.  ?   Mouth/Throat:  ?   Mouth: Mucous membranes are moist.  ?Eyes:  ?   Extraocular Movements: Extraocular movements intact.  ?Cardiovascular:  ?   Rate and Rhythm: Regular rhythm. Tachycardia present.  ?Pulmonary:  ?   Effort:  Pulmonary effort is normal.  ?   Comments: Coarse breath sounds bilaterally, no wheezing ?Abdominal:  ?   General: Bowel sounds are normal. There is no distension.  ?   Palpations: Abdomen is soft.  ?   Tenderness: There is no abdominal tenderness.  ?Musculoskeletal:     ?   General: No swelling.  ?   Cervical back: Normal range of motion.  ?   Comments: Left arm in sling  ?Skin: ?   General: Skin is warm and dry.  ?Neurological:  ?   General: No focal deficit present.  ?   Mental Status: She is alert.  ?Psychiatric:     ?   Mood and Affect: Mood normal.     ?   Behavior: Behavior normal.  ?  ? ?Consultants:  ?Cardiology ?Orthopedic surgery ? ?Procedures:  ?Reverse left total shoulder arthroplasty with biceps tenodesis, 10/01/2021 ? ?Data Reviewed: ?Results for orders placed or performed during the hospital encounter of 10/01/21 (from the past 24 hour(s))  ?I-STAT, chem 8     Status: Abnormal  ? Collection Time: 10/01/21  2:25 PM  ?Result Value Ref Range  ? Sodium 136 135 - 145 mmol/L  ? Potassium 5.6 (H) 3.5 - 5.1 mmol/L  ? Chloride 109 98 - 111 mmol/L  ? BUN 43 (H) 8 - 23 mg/dL  ? Creatinine, Ser 1.50 (H) 0.44 - 1.00 mg/dL  ? Glucose, Bld 226 (H)  70 - 99 mg/dL  ? Calcium, Ion 1.16 1.15 - 1.40 mmol/L  ? TCO2 25 22 - 32 mmol/L  ? Hemoglobin 13.3 12.0 - 15.0 g/dL  ? HCT 39.0 36.0 - 46.0 %  ?I-STAT, chem 8     Status: Abnormal  ? Collection Time: 10/01/21  3:06 PM  ?Result Value Ref Range  ? Sodium 135 135 - 145 mmol/L  ? Potassium 5.9 (H) 3.5 - 5.1 mmol/L  ? Chloride 109 98 - 111 mmol/L  ? BUN 41 (H) 8 - 23 mg/dL  ? Creatinine, Ser 1.60 (H) 0.44 - 1.00 mg/dL  ? Glucose, Bld 278 (H) 70 - 99 mg/dL  ? Calcium, Ion 1.11 (L) 1.15 - 1.40 mmol/L  ? TCO2 20 (L) 22 - 32 mmol/L  ? Hemoglobin 12.6 12.0 - 15.0 g/dL  ? HCT 37.0 36.0 - 46.0 %  ?Basic metabolic panel     Status: Abnormal  ? Collection Time: 10/01/21  5:36 PM  ?Result Value Ref Range  ? Sodium 132 (L) 135 - 145 mmol/L  ? Potassium 5.6 (H) 3.5 - 5.1 mmol/L  ? Chloride  106 98 - 111 mmol/L  ? CO2 20 (L) 22 - 32 mmol/L  ? Glucose, Bld 228 (H) 70 - 99 mg/dL  ? BUN 44 (H) 8 - 23 mg/dL  ? Creatinine, Ser 1.45 (H) 0.44 - 1.00 mg/dL  ? Calcium 8.4 (L) 8.9 - 10.3 mg/dL  ? GFR, Est

## 2021-10-02 NOTE — Consult Note (Signed)
?Cardiology Consultation:  ? ?Patient ID: Sarah Payne ?MRN: [id number]; DOB: 12/25/1945 ? ?Admit date: 10/01/2021 ?Date of Consult: 10/02/2021 ? ?PCP:  [name], NP ?  ?[facility] Providers ?Cardiologist:  Ida Rogue, MD   { ? ? ?Patient Profile:  ? ?Sarah Payne is a 76 y.o. female with a hx of CAD s/p prior PCI/DES to the distal RCA 08/3027 and mid left Cx 09/4191, chronic systolic heart failure, PAD, HLD, HTN, poorly controlled diabetes, stress cardiomyopathy, COPD, tobacco use, hypothyroidism, OSA, GERD, PVCs who is being seen 10/02/2021 for the evaluation of acute heart failure at the request of Dr. Roland Rack. ? ?History of Present Illness:  ? ?Sarah Payne is followed by Dr. Rockey Payne for the above cardiac issues. Seen in clinic in September for syncopal episodes. Echo showed EF 55%, no sigificant valvular disease, G1DD. She had a heart monitor that was overall reassuring, it showed predominately normal rhythm with 3 brief episodes of SVT and rare ectopy, BBB/IVCD was present.  ? ?She saw Dr. Rockey Payne 07/23/21 reporting persistent blacking out episodes. Many doses of medications were reduced due to orthostasis. She was started on meclizine.  ? ?She is on lasix 3 times a week, M/W/F. She is a smoker. ? ?The patient had ortho procedure, left shoulder replacement on 10/01/21. Post procedure she became acutely SOB. She had no chest pain. She progressed to respiratory distress and was given duoneb without improvement. She became lethargic and bag mask ventilation was initiated. She did regain consciousness and was weaned down to 2L O2.  ? ? ? ?Past Medical History:  ?Diagnosis Date  ? Aortic atherosclerosis ([place])   ? Bilateral cataracts 01/2015  ? a.) s/p extraction  ? CHF (congestive heart failure) ([place]) 09/16/2013  ? a.) TTE 09/16/2013: EF 55-60%; mod inferior HK, triv TR; G1DD. b.) TTE 01/02/2016: EF 25-30%; mid-apicalateroseptal, lat, inf, inferoseptal, apical akinesis; triv TR; G1DD. c. TTE 04/16/2016: EF 65%;  G1DD. d.) TTE 05/02/2020: EF 50-55%; G2DD. e.) TTE 04/11/2021: EF 55%; G1DD.  ? Chronic low back pain   ? Clostridioides difficile infection 2015  ? COPD (chronic obstructive pulmonary disease) ([place])   ? Coronary artery disease   ? a.) PCI 02/06/2011: 100% mLCx - DES x 3. b.) PCI 09/17/2011: 95% dRCA - DES x 1. c.) PCI 10/02/2011: 75% pLCX - DES x 1. d.) PCI 08/19/2012: DES x 2 p-mRCA.  e.) [place] 01/01/16: Takotsubo event 01/01/2016 with patent stents  ? Depression   ? Diverticulosis   ? Frequent PVCs   ? a. Noted in hospital 12/2015.  ? GERD (gastroesophageal reflux disease)   ? History of heart artery stent   ? a.) TOTAL of 7 stents: a.) 02/06/2011 - overlapping 2.5x12 mm Xience V, 2.5x8 mm Xience V, 2.25x8 mm Xience Nano to mLCx. b.) 09/17/2011 - 2.5x23 Xience V dRCA. c.) 10/02/2011 - 2.5x15 mm Xience V pLCx. d.) 08/09/2012 - overlapping 3.0x71m mRCA and 3.5x269mpRCA  ? Hyperlipidemia   ? Hypertension   ? Hypothyroidism   ? Impingement syndrome of left shoulder   ? LBBB (left bundle branch block)   ? Long term current use of antithrombotics/antiplatelets   ? a.) clopidogrel  ? Marijuana abuse   ? [facility]  ? a.) multiple MIs;  5 per her report  ? OSA (obstructive sleep apnea)   ? a.) mild; does not require nocturnal PAP therapy  ? [place]oxysmal [place]  ? a.)  Zio patch 05/08/2021: 3 distinct  SVT runs; fastest 5 beats at a rate of 146 bpm; longest 7 beats at rate of 134 bpm  ? T2DM (type 2 diabetes mellitus) ([place])   ? Takotsubo cardiomyopathy   ? a. 12/2015 - nephew committed suicide 1 week prior, sister died the morning of presentation - initially called a STEMI; cath with patent stents. LVEF 25-30%.  ? Tendonitis of left rotator cuff   ? Tobacco abuse   ? Vascular dementia   ? ? ?Past Surgical History:  ?Procedure Laterality Date  ? ABDOMINAL AORTOGRAM W/LOWER EXTREMITY N/A 09/12/2020  ? Procedure: ABDOMINAL AORTOGRAM W/LOWER EXTREMITY;  Surgeon: Wellington Hampshire, MD;  Location: [place] [facility];  Service: Cardiovascular;  Laterality: N/A;  ? ABDOMINAL HYSTERECTOMY    ? APPENDECTOMY    ? BACK SURGERY    ? CARDIAC CATHETERIZATION N/A 01/01/2016  ? Procedure: Left Heart Cath and Coronary Angiography;  Surgeon: Jettie Booze, MD;  Location: [place] [facility];  Service: Cardiovascular;  Laterality: N/A;  ? CATARACT EXTRACTION W/PHACO Left 01/30/2015  ? Procedure: CATARACT EXTRACTION PHACO AND INTRAOCULAR LENS PLACEMENT (IOC);  Surgeon: Birder Robson, MD;  Location: [facility];  Service: Ophthalmology;  Laterality: Left;  [place] 00:47 ?  ? CATARACT EXTRACTION W/PHACO Right 02/13/2015  ? Procedure: CATARACT EXTRACTION PHACO AND INTRAOCULAR LENS PLACEMENT (IOC);  Surgeon: Birder Robson, MD;  Location: [facility];  Service: Ophthalmology;  Laterality: Right;  cassette lot # [id number] H [place]  00:29.9 AP  20.7 CDE  6.20  ? COLONOSCOPY N/A 11/02/2014  ? Procedure: COLONOSCOPY;  Surgeon: Inda Castle, MD;  Location: [place];  Service: Endoscopy;  Laterality: N/A;  ? CORONARY ANGIOPLASTY WITH STENT PLACEMENT Left 02/06/2011  ? Procedure: CORONARY ANGIOPLASTY WITH STENT PLACEMENT; Location: [place]; Surgeon: Katrine Coho, MD  ? CORONARY ANGIOPLASTY WITH STENT PLACEMENT Left 09/17/2011  ? Procedure: CORONARY ANGIOPLASTY WITH STENT PLACEMENT; Location: [place]; Surgeon: Katrine Coho, MD  ? CORONARY ANGIOPLASTY WITH STENT PLACEMENT Left 10/02/2011  ? Procedure: CORONARY ANGIOPLASTY WITH STENT PLACEMENT; Location: [place]; Surgeon: Katrine Coho, MD  ? CORONARY ANGIOPLASTY WITH STENT PLACEMENT Left 08/19/2012  ? Procedure: CORONARY ANGIOPLASTY WITH STENT PLACEMENT; Location: [place]; Surgeon: Kathlyn Sacramento, MD  ? ESOPHAGOGASTRODUODENOSCOPY (EGD) WITH PROPOFOL N/A 09/21/2018  ? Procedure: ESOPHAGOGASTRODUODENOSCOPY (EGD) WITH PROPOFOL;  Surgeon: [name], MD;  Location: [facility];  Service: Gastroenterology;  Laterality: N/A;  ? LEFT HEART CATH AND CORONARY  ANGIOGRAPHY Left 08/02/2014  ? Procedure: LEFT HEART CATH AND CORONARY ANGIOGRAPHY; Location: [facility]; Surgeon: Glenetta Hew, MD  ? PERIPHERAL VASCULAR ATHERECTOMY Left 09/12/2020  ? Procedure: PERIPHERAL VASCULAR ATHERECTOMY;  Surgeon: Wellington Hampshire, MD;  Location: [place] [facility];  Service: Cardiovascular;  Laterality: Left;  ? PERIPHERAL VASCULAR BALLOON ANGIOPLASTY  09/12/2020  ? Procedure: PERIPHERAL VASCULAR BALLOON ANGIOPLASTY;  Surgeon: Wellington Hampshire, MD;  Location: [place] [facility];  Service: Cardiovascular;;  ? SHOULDER SURGERY Left 2017  ?  ? ?Home Medications:  ?Prior to Admission medications   ?Medication Sig Start Date End Date Taking? Authorizing Provider  ?amitriptyline (ELAVIL) 50 MG tablet Take 1 tablet (50 mg total) by mouth at bedtime as needed for sleep. 07/09/21 01/05/22 Yes Gillis Santa, MD  ?carvedilol (COREG) 3.125 MG tablet Take 1 tablet (3.125 mg total) by mouth 2 (two) times daily. 06/12/21  Yes Minna Merritts, MD  ?clopidogrel (PLAVIX) 75 MG tablet Take 1 tablet (75 mg total) by mouth daily. 04/12/21  Yes Visser, Jacquelyn D, PA-C  ?enalapril (VASOTEC) 10 MG tablet TAKE  1 TABLET BY MOUTH ONCE DAILY 06/12/21  Yes Gollan, Kathlene November, MD  ?ezetimibe (ZETIA) 10 MG tablet Take 1 tablet (10 mg total) by mouth daily. For cholesterol. 04/12/21  Yes [name] D, PA-C  ?furosemide (LASIX) 20 MG tablet Take one tab only on Monday, Wednesday, & Fridays 06/12/21  Yes Gollan, Kathlene November, MD  ?gabapentin (NEURONTIN) 300 MG capsule Take 1 capsule (300 mg total) by mouth 3 (three) times daily. 07/09/21 01/05/22 Yes Gillis Santa, MD  ?levothyroxine (SYNTHROID) 100 MCG tablet Take 1 tablet by mouth on an empty stomach with water only on Sunday, Monday, and Tuesday. No food or other medications for 30 minutes. 09/27/21  Yes [name], NP  ?meloxicam (MOBIC) 15 MG tablet Take 15 mg by mouth daily.   Yes [provider]  ?oxyCODONE (ROXICODONE) 5 MG immediate release  tablet Take 1-2 tablets (5-10 mg total) by mouth every 4 (four) hours as needed for moderate pain or severe pain. 10/01/21  Yes Poggi, Marshall Cork, MD  ?potassium chloride (KLOR-CON) 10 MEQ tablet Only take on days yo

## 2021-10-02 NOTE — Progress Notes (Signed)
?Subjective: ?1 Day Post-Op Procedure(s) (LRB): ?REVERSE SHOULDER ARTHROPLASTY WITH BICEPS TENODESIS. (Left) ?Patient reports pain as mild.   ?Patient is well, and has had no acute complaints or problems ?Hyperkalemia has resolved. ?Patient developed respiratory distress following surgery. ?Plan is to go Home after hospital stay. ?Negative for chest pain and shortness of breath ?Fever: no ?Gastrointestinal:Negative for nausea and vomiting ? ?Objective: ?Vital signs in last 24 hours: ?Temp:  [97.5 ?F (36.4 ?C)-98.7 ?F (37.1 ?C)] 98.1 ?F (36.7 ?C) (03/15 0848) ?Pulse Rate:  [45-84] 84 (03/15 0848) ?Resp:  [14-27] 18 (03/15 0848) ?BP: (109-186)/(54-104) 143/82 (03/15 0848) ?SpO2:  [92 %-100 %] 99 % (03/15 0848) ? ?Intake/Output from previous day: ? ?Intake/Output Summary (Last 24 hours) at 10/02/2021 1247 ?Last data filed at 10/02/2021 0400 ?Gross per 24 hour  ?Intake 608.17 ml  ?Output 800 ml  ?Net -191.83 ml  ?  ?Intake/Output this shift: ?No intake/output data recorded. ? ?Labs: ?Recent Labs  ?  10/01/21 ?1425 10/01/21 ?1506 10/02/21 ?0410  ?HGB 13.3 12.6 11.8*  ? ?Recent Labs  ?  10/01/21 ?1506 10/02/21 ?0410  ?WBC  --  14.7*  ?RBC  --  3.80*  ?HCT 37.0 35.2*  ?PLT  --  211  ? ?Recent Labs  ?  10/01/21 ?1736 10/02/21 ?0410  ?NA 132* 132*  ?K 5.6* 4.8  ?CL 106 103  ?CO2 20* 26  ?BUN 44* 45*  ?CREATININE 1.45* 1.58*  ?GLUCOSE 228* 211*  ?CALCIUM 8.4* 8.7*  ? ?No results for input(s): LABPT, INR in the last 72 hours. ? ? ?EXAM ?General - Patient is Alert, Appropriate, and Oriented ?Extremity - ABD soft ?Intact pulses distally ?Incision: dressing C/D/I ?No cellulitis present ?Decreased sensation to light touch to the left arm from recent left interscalene brachial plexus block. ?Dressing/Incision - clean, dry, no drainage to the left shoulder ?Motor Function - intact, moving foot and toes well on exam.  ?Abdomen soft with intact bowel sounds. ? ?Past Medical History:  ?Diagnosis Date  ? Aortic atherosclerosis ([place])   ?  Bilateral cataracts 01/2015  ? a.) s/p extraction  ? CHF (congestive heart failure) ([place]) 09/16/2013  ? a.) TTE 09/16/2013: EF 55-60%; mod inferior HK, triv TR; G1DD. b.) TTE 01/02/2016: EF 25-30%; mid-apicalateroseptal, lat, inf, inferoseptal, apical akinesis; triv TR; G1DD. c. TTE 04/16/2016: EF 65%; G1DD. d.) TTE 05/02/2020: EF 50-55%; G2DD. e.) TTE 04/11/2021: EF 55%; G1DD.  ? Chronic low back pain   ? Clostridioides difficile infection 2015  ? COPD (chronic obstructive pulmonary disease) ([place])   ? Coronary artery disease   ? a.) PCI 02/06/2011: 100% mLCx - DES x 3. b.) PCI 09/17/2011: 95% dRCA - DES x 1. c.) PCI 10/02/2011: 75% pLCX - DES x 1. d.) PCI 08/19/2012: DES x 2 p-mRCA.  e.) [place] 01/01/16: Takotsubo event 01/01/2016 with patent stents  ? Depression   ? Diverticulosis   ? Frequent PVCs   ? a. Noted in hospital 12/2015.  ? GERD (gastroesophageal reflux disease)   ? History of heart artery stent   ? a.) TOTAL of 7 stents: a.) 02/06/2011 - overlapping 2.5x12 mm Xience V, 2.5x8 mm Xience V, 2.25x8 mm Xience Nano to mLCx. b.) 09/17/2011 - 2.5x23 Xience V dRCA. c.) 10/02/2011 - 2.5x15 mm Xience V pLCx. d.) 08/09/2012 - overlapping 3.0x20m mRCA and 3.5x243mpRCA  ? Hyperlipidemia   ? Hypertension   ? Hypothyroidism   ? Impingement syndrome of left shoulder   ? LBBB (left bundle branch block)   ? Long  term current use of antithrombotics/antiplatelets   ? a.) clopidogrel  ? Marijuana abuse   ? Myocardial infarction [facility])   ? a.) multiple MIs;  5 per her report  ? OSA (obstructive sleep apnea)   ? a.) mild; does not require nocturnal PAP therapy  ? Paroxysmal SVT (supraventricular tachycardia) ([place]) 05/08/2021  ? a.)  Zio patch 05/08/2021: 3 distinct SVT runs; fastest 5 beats at a rate of 146 bpm; longest 7 beats at rate of 134 bpm  ? T2DM (type 2 diabetes mellitus) ([place])   ? Takotsubo cardiomyopathy   ? a. 12/2015 - nephew committed suicide 1 week prior, sister died the morning of presentation - initially called a  STEMI; cath with patent stents. LVEF 25-30%.  ? Tendonitis of left rotator cuff   ? Tobacco abuse   ? Vascular dementia   ? ? ?Assessment/Plan: ?1 Day Post-Op Procedure(s) (LRB): ?REVERSE SHOULDER ARTHROPLASTY WITH BICEPS TENODESIS. (Left) ?Principal Problem: ?  Hyperkalemia ?Active Problems: ?  CAD, multiple vessel ?  Hypothyroidism ?  Depression ?  Diabetes mellitus type 2 with peripheral artery disease ([place]) ?  COPD (chronic obstructive pulmonary disease) ([place]) ?  Tobacco dependence ?  S/p reverse total shoulder arthroplasty ?  Sinus bradycardia ?  Acute on chronic diastolic CHF (congestive heart failure) ([place]) ?  Stage 3b chronic kidney disease (CKD) ([place]) ? ?Estimated body mass index is 22.46 kg/m? as calculated from the following: ?  Height as of this encounter: '5\' 7"'$  (1.702 m). ?  Weight as of this encounter: 65 kg. ?Advance diet ?Up with therapy ?D/C IV fluids when tolerating po intake. ? ?Labs reviewed, vitals are stable. ?Hg 11.8, WBC 14.7.  Hyperkalemia has resolved.  Bradycardia improved. ?Left shoulder is doing well.  Placed order for PT evaluation. ?Will await recommendations from internal medicine pertaining possible discharge home today. ? ?DVT Prophylaxis - TED hose and SCDs ?Non-weightbearing to the left arm. ? ?J. Cameron Proud, PA-C ?[facility] Orthopaedic Surgery ?10/02/2021, 12:47 PM  ?

## 2021-10-02 NOTE — Evaluation (Signed)
Occupational Therapy Evaluation ?Patient Details ?Name: Sarah Payne ?MRN: [id number] ?DOB: January 07, 1946 ?Today's Date: 10/02/2021 ? ? ?History of Present Illness Sarah Payne is a 76 y.o. female with medical history significant for coronary artery disease status post PCI with stent angioplasty, history of COPD, nicotine dependence, GERD, depression who is status post reverse left total shoulder arthroplasty with biceps tenodesis.  Postop complaint of shortness of breath and went into respiratory distress.  ? ?Clinical Impression ?  ?Ms Minetti was seen for OT evaluation this date. Prior to hospital admission, pt was Independent for mobility and I/ADLs including caring for her husband. Pt lives with family in 2 level home c 3 STE. Pt presents to acute OT demonstrating impaired ADL performance and functional mobility 2/2 decreased activity tolerance , impaired functional use of non-dominant LUE, decreased safety awareness for NWBing precautions. Pt currently requires MAX A don/doff sling and polar care sitting EOB. SUPERVISION toilet t/f and perihygiene in standing. SETUP + SUPERVISION don pants/underwear. ? ?Pt and family instructed in polar care mgt, sling/immobilizer mgt, LUE precautions, adaptive strategies for bathing/dressing, positioning for sleep, and home/routines modifications. Handout provided. Pt would benefit from skilled OT to address noted impairments and functional limitations (see below for any additional details). Upon hospital discharge, recommend following surgeons plan for therapies at d/c.  ?   ? ?Recommendations for follow up therapy are one component of a multi-disciplinary discharge planning process, led by the attending physician.  Recommendations may be updated based on patient status, additional functional criteria and insurance authorization.  ? ?Follow Up Recommendations ? Follow physician's recommendations for discharge plan and follow up therapies  ?  ?Assistance Recommended at Discharge  Intermittent Supervision/Assistance  ?Patient can return home with the following A lot of help with bathing/dressing/bathroom;Assist for transportation ? ?  ?Functional Status Assessment ? Patient has had a recent decline in their functional status and demonstrates the ability to make significant improvements in function in a reasonable and predictable amount of time.  ?Equipment Recommendations ? None recommended by OT  ?  ?Recommendations for Other Services   ? ? ?  ?Precautions / Restrictions Precautions ?Precautions: Shoulder ?Shoulder Interventions: Shoulder sling/immobilizer;Shoulder abduction pillow;Off for dressing/bathing/exercises ?Precaution Booklet Issued: Yes (comment) ?Restrictions ?Weight Bearing Restrictions: Yes ?LUE Weight Bearing: Non weight bearing  ? ?  ? ?Mobility Bed Mobility ?Overal bed mobility: Modified Independent ?  ?  ?  ?  ?  ?  ?  ?  ? ?Transfers ?Overall transfer level: Needs assistance ?  ?Transfers: Sit to/from Stand ?Sit to Stand: Supervision ?  ?  ?  ?  ?  ?  ?  ? ?  ?Balance Overall balance assessment: Needs assistance ?Sitting-balance support: No upper extremity supported, Feet supported ?Sitting balance-Leahy Scale: Normal ?  ?  ?Standing balance support: During functional activity, No upper extremity supported ?Standing balance-Leahy Scale: Good ?  ?  ?  ?  ?  ?  ?  ?  ?  ?  ?  ?  ?   ? ?ADL either performed or assessed with clinical judgement  ? ?ADL Overall ADL's : Needs assistance/impaired ?  ?  ?  ?  ?  ?  ?  ?  ?  ?  ?  ?  ?  ?  ?  ?  ?  ?  ?  ?General ADL Comments: MAX A don/doff sling and polar care sitting EOB. SUPERVISION toilet t/f and perihygiene in standing. SETUP + SUPERVISION don pants/underwear.  ? ? ? ? ?  Pertinent Vitals/Pain Pain Assessment ?Pain Assessment: No/denies pain  ? ? ? ?Hand Dominance Right ?  ?Extremity/Trunk Assessment Upper Extremity Assessment ?Upper Extremity Assessment: LUE deficits/detail ?LUE: Unable to fully assess due to immobilization ?   ?Lower Extremity Assessment ?Lower Extremity Assessment: Overall WFL for tasks assessed ?  ?  ?  ?Communication Communication ?Communication: No difficulties ?  ?Cognition Arousal/Alertness: Awake/alert ?Behavior During Therapy: [facility] for tasks assessed/performed ?Overall Cognitive Status: Within Functional Limits for tasks assessed ?  ?  ?  ?  ?  ?  ?  ?  ?  ?  ?  ?  ?  ?  ?  ?  ?General Comments: cues to maintain NWBing ?  ?  ? ?Home Living Family/patient expects to be discharged to:: Private residence ?Living Arrangements: Spouse/significant other ?Available Help at Discharge: Family;Available 24 hours/day ?Type of Home: House ?Home Access: Stairs to enter ?Entrance Stairs-Number of Steps: 3 ?  ?Home Layout: Two level;Able to live on main level with bedroom/bathroom ?  ?  ?Bathroom Shower/Tub: Gaffer;Tub/shower unit ?  ?  ?  ?  ?Home Equipment: Shower seat;Grab bars - tub/shower ?  ?Additional Comments: reports equipment for husband who pt is caregiver for ?  ? ?  ?Prior Functioning/Environment Prior Level of Function : Independent/Modified Independent;Driving ?  ?  ?  ?  ?  ?  ?  ?  ?  ? ?  ?  ?OT Problem List: Decreased activity tolerance;Impaired balance (sitting and/or standing);Decreased safety awareness;Impaired UE functional use ?  ?   ?OT Treatment/Interventions: Self-care/ADL training;Therapeutic exercise;Energy conservation;DME and/or AE instruction;Therapeutic activities;Patient/family education;Balance training  ?  ?OT Goals(Current goals can be found in the care plan section) Acute Rehab OT Goals ?Patient Stated Goal: to go home ?OT Goal Formulation: With patient/family ?Time For Goal Achievement: 10/16/21 ?Potential to Achieve Goals: Good ?ADL Goals ?Pt Will Perform Grooming: with set-up;with supervision;standing ?Pt Will Perform Upper Body Dressing: with modified independence;with caregiver independent in assisting (no cues to maintain [place]) ?Pt Will Perform Toileting - Clothing  Manipulation and hygiene: sit to/from stand;Independently  ?OT Frequency: Min 2X/week ?  ? ?Co-evaluation   ?  ?  ?  ?  ? ?  ?AM-PAC OT "6 Clicks" Daily Activity     ?Outcome Measure Help from another person eating meals?: A Little ?Help from another person taking care of personal grooming?: A Little ?Help from another person toileting, which includes using toliet, bedpan, or urinal?: None ?Help from another person bathing (including washing, rinsing, drying)?: A Little ?Help from another person to put on and taking off regular upper body clothing?: A Lot ?Help from another person to put on and taking off regular lower body clothing?: A Little ?6 Click Score: 18 ?  ?End of Session Nurse Communication: Mobility status ? ?Activity Tolerance: Patient tolerated treatment well ?Patient left: in bed;with call bell/phone within reach;with bed alarm set;with family/visitor present ? ?OT Visit Diagnosis: Unsteadiness on feet (R26.81)  ?              ?Time: 1478-2956 ?OT Time Calculation (min): 38 min ?Charges:  OT General Charges ?$OT Visit: 1 Visit ?OT Evaluation ?$OT Eval Low Complexity: 1 Low ?OT Treatments ?$Self Care/Home Management : 23-37 mins ? ?Dessie Coma, M.S. OTR/L  ?10/02/21, 1:32 PM  ?ascom [phone] ? ?

## 2021-10-03 ENCOUNTER — Encounter: Payer: Self-pay | Admitting: Surgery

## 2021-10-03 ENCOUNTER — Inpatient Hospital Stay (HOSPITAL_COMMUNITY)
Admission: RE | Admit: 2021-10-03 | Discharge: 2021-10-03 | Disposition: A | Discharge status: Home / Self Care | Payer: Medicare PPO | Source: Home / Self Care | Attending: Internal Medicine | Admitting: Internal Medicine

## 2021-10-03 DIAGNOSIS — I25118 Atherosclerotic heart disease of native coronary artery with other forms of angina pectoris: Secondary | ICD-10-CM

## 2021-10-03 DIAGNOSIS — J432 Centrilobular emphysema: Secondary | ICD-10-CM

## 2021-10-03 DIAGNOSIS — Z96612 Presence of left artificial shoulder joint: Secondary | ICD-10-CM

## 2021-10-03 DIAGNOSIS — E1151 Type 2 diabetes mellitus with diabetic peripheral angiopathy without gangrene: Secondary | ICD-10-CM

## 2021-10-03 DIAGNOSIS — I5031 Acute diastolic (congestive) heart failure: Secondary | ICD-10-CM

## 2021-10-03 DIAGNOSIS — J810 Acute pulmonary edema: Secondary | ICD-10-CM

## 2021-10-03 LAB — ECHOCARDIOGRAM COMPLETE
AR max vel: 2.86 cm2
AV Area VTI: 3.86 cm2
AV Area mean vel: 2.98 cm2
AV Mean grad: 3 mmHg
AV Peak grad: 5 mmHg
Ao pk vel: 1.12 m/s
Area-P 1/2: 7.02 cm2
Height: 67 in
MV VTI: 2.63 cm2
S' Lateral: 2 cm
Weight: 2294.41 [oz_av]

## 2021-10-03 LAB — BASIC METABOLIC PANEL
Anion gap: 11 (ref 5–15)
BUN: 39 mg/dL — ABNORMAL HIGH (ref 8–23)
CO2: 25 mmol/L (ref 22–32)
Calcium: 9.1 mg/dL (ref 8.9–10.3)
Chloride: 99 mmol/L (ref 98–111)
Creatinine, Ser: 1.64 mg/dL — ABNORMAL HIGH (ref 0.44–1.00)
GFR, Estimated: 33 mL/min — ABNORMAL LOW (ref >60)
Glucose, Bld: 230 mg/dL — ABNORMAL HIGH (ref 70–99)
Potassium: 4.3 mmol/L (ref 3.5–5.1)
Sodium: 135 mmol/L (ref 135–145)

## 2021-10-03 LAB — CBC WITH DIFFERENTIAL/PLATELET
Abs Immature Granulocytes: 0.05 10*3/uL (ref 0.00–0.07)
Basophils Absolute: 0 10*3/uL (ref 0.0–0.1)
Basophils Relative: 0 %
Eosinophils Absolute: 0 10*3/uL (ref 0.0–0.5)
Eosinophils Relative: 0 %
HCT: 35.9 % — ABNORMAL LOW (ref 36.0–46.0)
Hemoglobin: 12.1 g/dL (ref 12.0–15.0)
Immature Granulocytes: 0 %
Lymphocytes Relative: 16 %
Lymphs Abs: 2.2 10*3/uL (ref 0.7–4.0)
MCH: 31.1 pg (ref 26.0–34.0)
MCHC: 33.7 g/dL (ref 30.0–36.0)
MCV: 92.3 fL (ref 80.0–100.0)
Monocytes Absolute: 1.3 10*3/uL — ABNORMAL HIGH (ref 0.1–1.0)
Monocytes Relative: 10 %
Neutro Abs: 9.7 10*3/uL — ABNORMAL HIGH (ref 1.7–7.7)
Neutrophils Relative %: 74 %
Platelets: 196 10*3/uL (ref 150–400)
RBC: 3.89 MIL/uL (ref 3.87–5.11)
RDW: 12.6 % (ref 11.5–15.5)
WBC: 13.3 10*3/uL — ABNORMAL HIGH (ref 4.0–10.5)
nRBC: 0 % (ref 0.0–0.2)

## 2021-10-03 LAB — MAGNESIUM: Magnesium: 1.6 mg/dL — ABNORMAL LOW (ref 1.7–2.4)

## 2021-10-03 LAB — HEMOGLOBIN A1C
Hgb A1c MFr Bld: 8.2 % — ABNORMAL HIGH (ref 4.8–5.6)
Mean Plasma Glucose: 189 mg/dL

## 2021-10-03 LAB — T3: T3, Total: 148 ng/dL (ref 71–180)

## 2021-10-03 LAB — SURGICAL PATHOLOGY

## 2021-10-03 LAB — GLUCOSE, CAPILLARY
Glucose-Capillary: 225 mg/dL — ABNORMAL HIGH (ref 70–99)
Glucose-Capillary: 258 mg/dL — ABNORMAL HIGH (ref 70–99)

## 2021-10-03 MED ORDER — MAGNESIUM SULFATE 2 GM/50ML IV SOLN
2.0000 g | Freq: Once | INTRAVENOUS | Status: AC
  Administered 2021-10-03: 2 g via INTRAVENOUS
  Filled 2021-10-03: qty 50

## 2021-10-03 MED ORDER — ACETAMINOPHEN 500 MG PO TABS
1000.0000 mg | ORAL_TABLET | Freq: Four times a day (QID) | ORAL | Status: DC
  Administered 2021-10-03: 1000 mg via ORAL
  Filled 2021-10-03: qty 2

## 2021-10-03 MED ORDER — ONDANSETRON HCL 4 MG PO TABS
4.0000 mg | ORAL_TABLET | Freq: Four times a day (QID) | ORAL | 0 refills | Status: DC | PRN
Start: 2021-10-03 — End: 2022-10-10

## 2021-10-03 MED ORDER — OXYCODONE HCL 5 MG PO TABS: 5.0000 mg | ORAL_TABLET | ORAL | Status: DC | PRN

## 2021-10-03 MED ORDER — CARVEDILOL 3.125 MG PO TABS: 3.1250 mg | ORAL_TABLET | Freq: Two times a day (BID) | ORAL | Status: DC

## 2021-10-03 MED ORDER — LEVOTHYROXINE SODIUM 75 MCG PO TABS
75.0000 ug | ORAL_TABLET | Freq: Every day | ORAL | 3 refills | Status: DC
Start: 2021-10-04 — End: 2022-02-04

## 2021-10-03 NOTE — Progress Notes (Signed)
*  PRELIMINARY RESULTS* ?Echocardiogram ?2D Echocardiogram has been performed. ? ?Sarah Payne, [place] ?10/03/2021, 8:06 AM ?

## 2021-10-03 NOTE — Progress Notes (Addendum)
? ?Progress Note ? ?Patient Name: Sarah Payne ?Date of Encounter: 10/03/2021 ? ?[facility] Cardiologist: Ida Rogue, MD  ? ?Subjective  ? ? ?Reports feeling well today, denies shortness of breath ?Feels that she could go home ?Orthostatics positive yesterday 937 systolic down to 90 with standing ?Heart rate elevated ?Repeat orthostatics today 126 supine down to 100 standing ? ?Inpatient Medications  ?  ?Scheduled Meds: ? acetaminophen  1,000 mg Oral Q6H  ? ezetimibe  10 mg Oral Daily  ? furosemide  40 mg Oral Daily  ? gabapentin  300 mg Oral TID  ? insulin aspart  0-15 Units Subcutaneous TID WC  ? insulin glargine-yfgn  30 Units Subcutaneous Daily  ? ipratropium-albuterol  3 mL Nebulization Once  ? isosorbide mononitrate  30 mg Oral Daily  ? levothyroxine  75 mcg Oral Q0600  ? nicotine  21 mg Transdermal Daily  ? rosuvastatin  40 mg Oral QPM  ? sodium chloride flush  3 mL Intravenous Q12H  ? ?Continuous Infusions: ? sodium chloride    ? ?PRN Meds: ?sodium chloride, albuterol, amitriptyline, metoCLOPramide **OR** metoCLOPramide (REGLAN) injection, ondansetron **OR** ondansetron (ZOFRAN) IV, oxyCODONE, sodium chloride flush, traZODone  ? ?Vital Signs  ?  ?Vitals:  ? 10/02/21 1530 10/02/21 2038 10/03/21 0343 10/03/21 1155  ?BP: 140/70 135/69 (!) 125/57 127/86  ?Pulse: 95 (!) 103 96 (!) 107  ?Resp: '16 18 18 18  '$ ?Temp: 98.4 ?F (36.9 ?C) 98.7 ?F (37.1 ?C) 99.4 ?F (37.4 ?C) 98.7 ?F (37.1 ?C)  ?TempSrc:      ?SpO2: 94% 93% (!) 84% 94%  ?Weight:      ?Height:      ? ?No intake or output data in the 24 hours ending 10/03/21 1239 ?Last 3 Weights 10/01/2021 09/24/2021 07/24/2021  ?Weight (lbs) 143 lb 6.4 oz 143 lb 6.4 oz 149 lb  ?Weight (kg) 65.046 kg 65.046 kg 67.586 kg  ?   ? ?Telemetry  ?  ?Sinus tachycardia, PVCs- Personally Reviewed ? ?ECG  ?  ? Personally Reviewed ? ?Physical Exam  ? ?GEN: No acute distress.   ?Neck: No JVD ?Cardiac: RRR, no murmurs, rubs, or gallops.  ?Respiratory: Clear to auscultation  bilaterally. ?GI: Soft, nontender, non-distended  ?MS: No edema; No deformity. ?Neuro:  Nonfocal  ?Psych: Normal affect  ? ?Labs  ?  ?High Sensitivity Troponin:   ?Recent Labs  ?Lab 10/01/21 ?1736 10/01/21 ?1936 10/02/21 ?1696  ?TROPONINIHS 23* 41* 49*  ?   ?Chemistry ?Recent Labs  ?Lab 10/01/21 ?1736 10/02/21 ?0410 10/03/21 ?7893  ?NA 132* 132* 135  ?K 5.6* 4.8 4.3  ?CL 106 103 99  ?CO2 20* 26 25  ?GLUCOSE 228* 211* 230*  ?BUN 44* 45* 39*  ?CREATININE 1.45* 1.58* 1.64*  ?CALCIUM 8.4* 8.7* 9.1  ?MG  --   --  1.6*  ?GFRNONAA 38* 34* 33*  ?ANIONGAP 6 3* 11  ?  ?Lipids No results for input(s): CHOL, TRIG, HDL, LABVLDL, LDLCALC, CHOLHDL in the last 168 hours.  ?Hematology ?Recent Labs  ?Lab 10/01/21 ?1506 10/02/21 ?0410 10/03/21 ?8101  ?WBC  --  14.7* 13.3*  ?RBC  --  3.80* 3.89  ?HGB 12.6 11.8* 12.1  ?HCT 37.0 35.2* 35.9*  ?MCV  --  92.6 92.3  ?MCH  --  31.1 31.1  ?MCHC  --  33.5 33.7  ?RDW  --  12.5 12.6  ?PLT  --  211 196  ? ?Thyroid  ?Recent Labs  ?Lab 10/01/21 ?1736 10/02/21 ?0928  ?TSH 0.025*  --   ?  FREET4  --  2.92*  ?  ?BNP ?Recent Labs  ?Lab 10/02/21 ?0410  ?BNP 265.6*  ?  ?DDimer No results for input(s): DDIMER in the last 168 hours.  ? ?Radiology  ?  ?DG Chest Port 1 View ? ?Result Date: 10/01/2021 ?CLINICAL DATA:  Cardiac arrest after shoulder surgery. EXAM: PORTABLE CHEST 1 VIEW COMPARISON:  CT chest dated May 13, 2021. Chest x-ray dated August 24, 2018. FINDINGS: The heart size and mediastinal contours are within normal limits. Pulmonary vascular congestion. Mild interstitial thickening and heterogeneous opacity at the right greater than left lung bases. Possible small bilateral pleural effusions. No pneumothorax. No acute osseous abnormality. Left reverse total shoulder arthroplasty. IMPRESSION: 1. Mild pulmonary edema with suspected small bilateral pleural effusions. 2. Bibasilar atelectasis. Electronically Signed   By: Titus Dubin M.D.   On: 10/01/2021 14:30  ? ?DG Shoulder Left Port ? ?Result  Date: 10/01/2021 ?CLINICAL DATA:  Postop EXAM: LEFT SHOULDER COMPARISON:  Left shoulder x-ray 05/15/2021 FINDINGS: Recent postoperative changes of a left total reverse shoulder arthroplasty. The hardware appears aligned and intact. No acute fracture or dislocation identified. Overlying surgical staples. IMPRESSION: Left shoulder arthroplasty surgical changes. Electronically Signed   By: Ofilia Neas M.D.   On: 10/01/2021 13:40  ? ?ECHOCARDIOGRAM COMPLETE ? ?Result Date: 10/03/2021 ?   ECHOCARDIOGRAM REPORT   Patient Name:   Sarah Payne Date of Exam: 10/03/2021 Medical Rec #:  [id number]     Height:       67.0 in Accession #:    [id number]    Weight:       143.4 lb Date of Birth:  July 21, 1946     BSA:          1.756 m? Patient Age:    76 years      BP:           125/57 mmHg Patient Gender: F             HR:           96 bpm. Exam Location:  [facility] Procedure: 2D Echo, Color Doppler and Cardiac Doppler Indications:     CHF-acute diastolic Q73.41  History:         Patient has prior history of Echocardiogram examinations, most                  recent 04/11/2021. CHF, COPD; Risk Factors:Hypertension.  Sonographer:     Sherrie Sport Referring Phys:  [place] Diagnosing Phys: Ida Rogue MD  Sonographer Comments: No parasternal window, suboptimal apical window and no subcostal window. IMPRESSIONS  1. Challenging images  2. Left ventricular ejection fraction, by estimation, is 55 to 60%. The left ventricle has normal function. Septal/anteroseptal wall motion abnormality possibly secondary to LBBB. Left ventricular diastolic parameters are consistent with Grade I diastolic dysfunction (impaired relaxation).  3. Right ventricular systolic function is normal. The right ventricular size is normal. There is normal pulmonary artery systolic pressure. The estimated right ventricular systolic pressure is 93.7 mmHg.  4. The mitral valve is normal in structure. No evidence of mitral valve regurgitation. No evidence of mitral  stenosis.  5. The aortic valve was not well visualized. Aortic valve regurgitation is not visualized. No aortic stenosis is present.  6. The inferior vena cava is normal in size with greater than 50% respiratory variability, suggesting right atrial pressure of 3 mmHg. FINDINGS  Left Ventricle: Left ventricular ejection fraction, by estimation, is 55 to 60%. The left ventricle  has normal function. The left ventricle has no regional wall motion abnormalities. The left ventricular internal cavity size was normal in size. There is  no left ventricular hypertrophy. Left ventricular diastolic parameters are consistent with Grade I diastolic dysfunction (impaired relaxation). Right Ventricle: The right ventricular size is normal. No increase in right ventricular wall thickness. Right ventricular systolic function is normal. There is normal pulmonary artery systolic pressure. The tricuspid regurgitant velocity is 2.09 m/s, and  with an assumed right atrial pressure of 5 mmHg, the estimated right ventricular systolic pressure is 27.0 mmHg. Left Atrium: Left atrial size was normal in size. Right Atrium: Right atrial size was normal in size. Pericardium: There is no evidence of pericardial effusion. Mitral Valve: The mitral valve is normal in structure. No evidence of mitral valve regurgitation. No evidence of mitral valve stenosis. MV peak gradient, 5.9 mmHg. The mean mitral valve gradient is 2.0 mmHg. Tricuspid Valve: The tricuspid valve is normal in structure. Tricuspid valve regurgitation is not demonstrated. No evidence of tricuspid stenosis. Aortic Valve: The aortic valve was not well visualized. Aortic valve regurgitation is not visualized. No aortic stenosis is present. Aortic valve mean gradient measures 3.0 mmHg. Aortic valve peak gradient measures 5.0 mmHg. Aortic valve area, by VTI measures 3.86 cm?. Pulmonic Valve: The pulmonic valve was normal in structure. Pulmonic valve regurgitation is not visualized. No  evidence of pulmonic stenosis. Aorta: The aortic root is normal in size and structure. Venous: The inferior vena cava is normal in size with greater than 50% respiratory variability, suggesting right atrial pressure of

## 2021-10-03 NOTE — Progress Notes (Addendum)
Inpatient Diabetes Program Recommendations ? ?AACE/ADA: New Consensus Statement on Inpatient Glycemic Control (2015) ? ?Target Ranges:  Prepandial:   less than 140 mg/dL ?     Peak postprandial:   less than 180 mg/dL (1-2 hours) ?     Critically ill patients:  140 - 180 mg/dL  ? ?Lab Results  ?Component Value Date  ? GLUCAP 225 (H) 10/03/2021  ? HGBA1C 8.2 (H) 10/02/2021  ? ? ?Review of Glycemic Control ? Latest Reference Range & Units 10/01/21 22:30 10/02/21 05:48 10/02/21 07:47 10/02/21 11:29 10/02/21 16:59 10/02/21 21:26 10/03/21 08:18  ?Glucose-Capillary 70 - 99 mg/dL 325 (H) 168 (H) 170 (H) 344 (H) 230 (H) 182 (H) 225 (H)  ? ?Diabetes history: DM 2 ?Outpatient Diabetes medications:  ?Novolog 10 units bid, Lantus 60 units q HS ?Current orders for Inpatient glycemic control:  ?Novolog moderate tid with meals ?Semglee 30 units daily ? ?Inpatient Diabetes Program Recommendations:   ? ?Please consider adding Novolog meal coverage 6 units tid with meals.  Note fasting CBG>200 mg/dL. Per MD, patient refused Semglee last night?  Unsure why?  Hopefully fasting will improve with basal insulin.   ? ?Thanks,  ?Adah Perl, RN, BC-ADM ?Inpatient Diabetes Coordinator ?Pager [phone]  (8a-5p) ? ? ?

## 2021-10-03 NOTE — Discharge Summary (Signed)
?Physician Discharge Summary ?  ?Patient: Sarah Payne MRN: [id number] DOB: Jun 25, 1946  ?Admit date:     10/01/2021  ?Discharge date: 10/03/2021  ?Discharge Physician: Dwyane Dee  ? ?PCP: [name], NP  ? ?Recommendations at discharge:  ? ? Recheck orthostatic BP ?Repeat BMP ?Repeat TSH and Free T4 in 4-6 weeks ? ?Discharge Diagnoses: ?Active Problems: ?  Orthostatic hypotension ?  Sinus bradycardia ?  Acute on chronic diastolic CHF (congestive heart failure) ([place]) ?  Hypothyroidism ?  S/p reverse total shoulder arthroplasty ?  Diabetes mellitus type 2 with peripheral artery disease ([place]) ?  Depression ?  CAD, multiple vessel ?  COPD (chronic obstructive pulmonary disease) ([place]) ?  Tobacco dependence ?  Stage 3b chronic kidney disease (CKD) ([place]) ? ?Principal Problem (Resolved): ?  Hyperkalemia ? ?Hospital Course: ?Sarah Payne is a 76 yo female with PMH chronic dCHF, ongoing tobacco use, COPD, depression, GERD, HTN, HLD, hypothyroidism, OSA, DM II who presented to the hospital for left total shoulder arthroplasty with biceps tenodesis.  She had undergone outpatient work-up with orthopedic surgery which revealed osteoarthritis of the left shoulder and left rotator cuff tendinitis. ?Surgery was performed on 10/01/2021 and after surgery she developed acute onset of shortness of breath and respiratory distress.  She did not respond to a DuoNeb and became progressively more lethargic, unable to follow commands.  She was started on oxygen and given sugammadex to reverse any possible ongoing diaphragmatic muscle paralysis. ?Further work-up also revealed hyperkalemia, peaked at 5.9.  She was treated with insulin and also given Lasix for presumed acute volume overload.  A CXR was obtained which showed mild pulmonary edema with small bilateral pleural effusions and bibasilar atelectasis. ?She was admitted for further work-up and evaluation. ? ?Assessment and Plan: ?* Hyperkalemia-resolved as of 10/03/2021 ?- K peaked at  5.9 ?- probable mixed etiology (ACEi, sprionolactone, mild worsened renal function ?- responded well to treatment ?-Spironolactone discontinued at discharge per cardiology ? ?Acute on chronic diastolic CHF (congestive heart failure) ([place]) ?- last echo 03/2021: EF 55%, Gr 1 DD. No RWMA, mild LVH. ?- currently on coreg, spironolactone, enalapril, imdur (and also complaining of "passing out when standing" at home for several months) ?- see orthostatic hypotension as well ?- CXR showed mild pulmonary edema with small bilateral pleural effusions and bibasilar atelectasis ?- s/p lasix on admission with good response  ?-Echo repeated during hospitalization: EF 55 to 78%, grade 1 diastolic dysfunction. ? ?Sinus bradycardia ?Most likely related to carvedilol use as well as hyperkalemia on admission ?-Still having intermittent PVCs.  Coreg continued per cardiology ? ?Orthostatic hypotension ?- Patient also complaining of episodes at home feeling dizzy and lightheaded when standing to the point of even becoming diaphoretic, nauseous, and vomiting ?- Does check blood pressure periodically but only when feeling well not during these episodes ?- She has had recent work-up in September 2022 for syncope as well; she was found to be orthostatic positive in September during check also; EtOH use was also felt to be contributing at that time.  Also has known history of prior severe LV dysfunction consistent with Takotsubo and last cath was 2017. ?-Repeat orthostatic blood pressure today is again positive ?-Has only received Imdur so far.  Likely still multiple antihypertensives contributing.  Follow-up cardiology evaluation for med adjustment ?-Meds adjusted at time of discharge per cardiology, appreciate assistance.  Continued on Coreg and Lasix ? ?S/p reverse total shoulder arthroplasty ?- Underwent total shoulder arthroplasty with biceps tenodesis on 10/01/2021  due to underlying osteoarthritis and rotator cuff  tendinitis ?-Nonweightbearing to left arm per orthopedic surgery.  Sling in place ?-Continue pain control ? ?Hypothyroidism ?- TSH suppressed, 0.025 ?- Free T4 elevated, 2.92 ?- Follow-up total T3 (148) ?- Currently taking Synthroid 100 mcg on Sunday, Monday, Tuesday and 88 mcg the other 4 days ?- decrease and simplify regimen; I discussed this in great detail with patient.  ?- starting Synthroid 75 mg daily (weekly dose is less than prior weekly dose) ?- repeat TSH and FT4 in 4-6 weeks ? ?Diabetes mellitus type 2 with peripheral artery disease ([place]) ?- Last A1c 8.1% on 07/18/2021 ?- Continue SSI and CBG monitoring ?- Continue Levemir ? ? ?Depression ?- Continue trazodone and amitriptyline ?- if still orthostatic after BP regimen adjustment, would need to consider modifying amitriptyline next  ? ?Stage 3b chronic kidney disease (CKD) ([place]) ?- patient has history of CKD3b. Baseline creat ~ 1.3 - 1.4, eGFR 40 ? ? ?Tobacco dependence ?- Still smoking approximately 1 PPD and she endorses no desire to quit even though I again strongly recommended this which she understands is needed ?-Continue nicotine patch ? ?COPD (chronic obstructive pulmonary disease) ([place]) ?Not acutely exacerbated ?Continue PRN bronchodilator therapy as well as inhaled steroids ? ?CAD, multiple vessel ?Patient has a known history of coronary artery disease status post PCI with stent angioplasty. ?- follow up cardiology consult; suspect may not need all of home regimen given orthostasis and syncope ? ? ? ? ?  ? ? ?Consultants: Cardiology ?Procedures performed:   ?Disposition: Home ?Diet recommendation:  ?Discharge Diet Orders (From admission, onward)  ? ?  Start     Ordered  ? 10/03/21 0000  Diet Carb Modified       ? 10/03/21 1330  ? ?  ?  ? ?  ? ?Cardiac diet ?DISCHARGE MEDICATION: ?Allergies as of 10/03/2021   ?No Known Allergies ?  ? ?  ?Medication List  ?  ? ?STOP taking these medications   ? ?isosorbide mononitrate 30 MG 24 hr tablet ?Commonly  known as: IMDUR ?  ?meclizine 25 MG tablet ?Commonly known as: ANTIVERT ?  ?spironolactone 25 MG tablet ?Commonly known as: Aldactone ?  ? ?  ? ?TAKE these medications   ? ?albuterol 108 (90 Base) MCG/ACT inhaler ?Commonly known as: VENTOLIN HFA ?Inhale 2 puffs into the lungs every 6 (six) hours as needed for wheezing or shortness of breath. ?  ?amitriptyline 50 MG tablet ?Commonly known as: ELAVIL ?Take 1 tablet (50 mg total) by mouth at bedtime as needed for sleep. ?  ?carvedilol 3.125 MG tablet ?Commonly known as: COREG ?Take 1 tablet (3.125 mg total) by mouth 2 (two) times daily. ?  ?clopidogrel 75 MG tablet ?Commonly known as: PLAVIX ?Take 1 tablet (75 mg total) by mouth daily. ?  ?enalapril 10 MG tablet ?Commonly known as: VASOTEC ?TAKE 1 TABLET BY MOUTH ONCE DAILY ?  ?ezetimibe 10 MG tablet ?Commonly known as: ZETIA ?Take 1 tablet (10 mg total) by mouth daily. For cholesterol. ?  ?FreeStyle Russell 2 Reader Kerrin Mo ?Use with sensor to check blood sugars 6 times daily. ?  ?FreeStyle Libre 2 Sensor Misc ?USE TO CHECK SUGARS 6 TIMES DAILY ?  ?FREESTYLE LITE test strip ?Generic drug: glucose blood ?Test up to four times a day as needed. DX ?  ?furosemide 20 MG tablet ?Commonly known as: LASIX ?Take one tab only on Monday, Wednesday, & Fridays ?  ?gabapentin 300 MG capsule ?Commonly known as: Neurontin ?Take  1 capsule (300 mg total) by mouth 3 (three) times daily. ?  ?Insupen Pen Needles 32G X 4 MM Misc ?Generic drug: Insulin Pen Needle ?Use to inject insulin 4 times daily. ?  ?Lantus SoloStar 100 UNIT/ML Solostar Pen ?Generic drug: insulin glargine ?Inject 60 Units into the skin at bedtime. For diabetes. ?  ?levothyroxine 75 MCG tablet ?Commonly known as: SYNTHROID ?Take 1 tablet (75 mcg total) by mouth daily at 6 (six) AM. ?Start taking on: October 04, 2021 ?What changed:  ?medication strength ?how much to take ?how to take this ?when to take this ?additional instructions ?Another medication with the same name was  removed. Continue taking this medication, and follow the directions you see here. ?  ?meloxicam 15 MG tablet ?Commonly known as: MOBIC ?Take 15 mg by mouth daily. ?  ?NovoLOG FlexPen 100 UNIT/ML FlexPen ?Generic drug: insulin a

## 2021-10-04 DIAGNOSIS — Z96612 Presence of left artificial shoulder joint: Secondary | ICD-10-CM | POA: Insufficient documentation

## 2021-10-04 NOTE — Telephone Encounter (Signed)
Spoke with patient regarding insulin regimen after discharge. ?BG this morning was 257, however pt has not taken any insulin since coming home from the hospital yesterday. ? ?Advised pt to resume Lantus 10 units at night, continue to hold Novolog. Will call patient back on Monday to further adjust Lantus if needed. Pt has PCP appt 3/24 as well. ?

## 2021-10-04 NOTE — Progress Notes (Addendum)
Called patient to see how she is doing after shoulder surgery. I spoke with the patient and the patient's daughter in law [name]). Patient stated after surgery she was unresponsive. Patient stated she was told she had heart failure. Dr. Rockey Situ was called in and reviewed her Echocardiogram and told the patient that she had an acute episode of heart failure. Dr. Rockey Situ stated her Echo look good and she did not have blockage. Patient stated Dr. Rockey Situ told her that the hospital did not pace her fluid and her body couldn't handle as much as they were giving her. Stated they flooded her system and she had fluid build up around her heart and lungs. Patient stated the hospital could not control her sugar despite the patient informing the hospital her instructions to stop Novolog and administer 10 units of Lantus at night. Patient stated her sugars were staying between 200-300 and went as high as 320 while in the hospital. Patient was just discharged from the hospital yesterday on 10/03/2021. Patient's glucose today (03/17) was 257.  ? ?Sarah Payne, CPP notified ? ?[name], RMA ?Clinical Pharmacy Assistant ?[phone] ? ? ?

## 2021-10-07 DIAGNOSIS — Z471 Aftercare following joint replacement surgery: Secondary | ICD-10-CM | POA: Diagnosis not present

## 2021-10-08 ENCOUNTER — Encounter: Payer: Self-pay | Admitting: Surgery

## 2021-10-09 NOTE — Progress Notes (Deleted)
?   Patient ID: Sarah Payne, female    DOB: [birth year], 76 y.o.   MRN: [id number] ? ?HPI ? ?Sarah Payne is a 76 y/o female with a history of ? ?Echo report from 10/03/21 reviewed and showed an EF of 55-60%.  ? ?LHC done 01/01/16 and showed: ?Patent stents in the circumflex and RCA. ?There is severe left ventricular systolic dysfunction in a pattern of Takotsubo cardiomyopathy. ? ?Admitted 10/01/21 due to left total shoulder arthroplasty with biceps tenodesis. After surgery she developed acute onset of shortness of breath and respiratory distress. Cardiology consult obtained. She was started on oxygen and given sugammadex to reverse any possible ongoing diaphragmatic muscle paralysis. ?Further work-up also revealed hyperkalemia, peaked at 5.9.  She was treated with insulin and also given Lasix for presumed acute volume overload.             Discharged after 2 days.  ? ?She presents today for her initial visit with a chief complaint of  ? ?Review of Systems ? ? ? ?Physical Exam ? ?  ? ?Assessment & Plan: ? ?1: Chronic heart failure with preserved ejection fraction without structural changes- ?- NYHA class ?- saw cardiology Rockey Situ) 07/23/21 ?- BNP 10/02/21 was 265.6 ? ?2: HTN- ?- BP ?- video visit with PCP Carlis Abbott) 07/24/21 ?- BMP 10/03/21 reviewed and showed sodium 135, potassium 4.3, creatinine 1.64 and GFR 33 ? ?3: COPD- ? ?4: Tobacco use-  ? ?5: Recent left shoulder surgery- ?- saw orthopaedic (Poggi) 09/16/21 ?

## 2021-10-10 ENCOUNTER — Ambulatory Visit: Payer: Medicare PPO | Admitting: Family

## 2021-10-11 ENCOUNTER — Encounter: Payer: Self-pay | Admitting: Primary Care

## 2021-10-11 ENCOUNTER — Other Ambulatory Visit: Payer: Self-pay

## 2021-10-11 ENCOUNTER — Ambulatory Visit (INDEPENDENT_AMBULATORY_CARE_PROVIDER_SITE_OTHER): Payer: Medicare PPO | Admitting: Primary Care

## 2021-10-11 VITALS — BP 126/78 | HR 78 | Ht 67.0 in | Wt 140.2 lb

## 2021-10-11 DIAGNOSIS — E1151 Type 2 diabetes mellitus with diabetic peripheral angiopathy without gangrene: Secondary | ICD-10-CM

## 2021-10-11 DIAGNOSIS — R42 Dizziness and giddiness: Secondary | ICD-10-CM | POA: Diagnosis not present

## 2021-10-11 DIAGNOSIS — F32A Depression, unspecified: Secondary | ICD-10-CM | POA: Diagnosis not present

## 2021-10-11 DIAGNOSIS — Z96612 Presence of left artificial shoulder joint: Secondary | ICD-10-CM

## 2021-10-11 MED ORDER — MIRTAZAPINE 7.5 MG PO TABS: 7.5000 mg | ORAL_TABLET | Freq: Every day | ORAL | 0 refills | Status: DC

## 2021-10-11 NOTE — Assessment & Plan Note (Addendum)
Recent A1c of 8.2 in March 2023 during admission for shoulder reconstruction surgery. ? ?We will resume Lantus 10 units nightly given glucose increases.  Continue NovoLog 16 units at lunch for now, discussed that she may have to adjust this up or down according to glucose readings once we initiate Lantus. ? ?She will continue to closely monitor her levels and notify me if she sees readings consistently at or above 150 after a few weeks, or if she notices drops in glucose readings below 80. ? ?Follow-up in 3 months, or sooner if needed ?

## 2021-10-11 NOTE — Assessment & Plan Note (Signed)
Deteriorated. ? ?Reviewed pharmacist notes and recommendations from February 2023, will add mirtazapine 7.5 mg at bedtime for appetite, depression, sleep. ? ?I discussed with both she and her daughter-in-law potential risks for drug interactions/drowsiness with addition of mirtazapine to her medication list.  They would both like to proceed with mirtazapine. ? ?Follow-up in 3 months, sooner if needed. ?

## 2021-10-11 NOTE — Progress Notes (Signed)
? ?Subjective:  ? ? Patient ID: Sarah Payne, female    DOB: Jun 28, 1946, 76 y.o.   MRN: [id number] ? ?HPI ? ?Sarah Payne is a very pleasant 76 y.o. female with a significant medical history including type 2 diabetes, CAD, CHF, hypertension, COPD, OSA, chronic back pain, CKD, hyperlipidemia, anxiety depression, hypothyroidism who presents today for follow up of diabetes, to discuss anxiety/depression, and for ENT referral.  Her daughter-in-law joins [place] today. ? ?1) Type 2 Diabetes:  ? ?Current medications include: Lantus insulin 60 units at bedtime, NovoLog insulin 10 units twice daily with meals.  ? ?She's currently injecting Novolog 16 units once daily in the afternoon. She's not injected her Lantus in over 1 week.  ? ?She is checking her blood glucose multiple times daily and is getting readings ranging in the low 200's throughout the day. ? ?Last A1C: 8.1 in December 2023, 8.2 in March 2023 ?Last Eye Exam: Due ?Last Foot Exam: Due ?Pneumonia Vaccination: 2020 ?Urine Microalbumin: None. Managed on ACE-I ?Statin: Crestor ? ?Dietary changes since last visit: None. Decreased appetite.  ? ?Exercise: None ? ?2) Anxiety and Depression: Currently managed on amitriptyline 50 mg at bedtime for pain.  Not currently on treatment for anxiety or depression. ? ?Our pharmacist spoke with patient on 09/09/2021, patient mentioned significant caregiver stress, and symptoms of low energy, sleep disturbance, decrease in appetite.  PHQ-9 score was 15.  Recommendations per our pharmacist was a referral to psychiatry or counseling, but patient declined.  During this call the patient requested medication to help her symptoms, but she was already taking amitriptyline 50 mg at bedtime for pain.  Our pharmacist recommended mirtazapine 7.5 mg at bedtime for sleep and appetite improvements. ? ?She is recently status post shoulder reverse left shoulder replacement as of last week. Her daughter in law endorses a decreased appetite, nothing  tastes good. She agrees and also mentions feeling down and depressed.  She has been under a lot of stress with the care of her husband and her ongoing medical problems. ? ?3) Chronic Dizziness: Chronic since last Summer/Fall.  Gradual increase in symptoms with daily symptoms over the last month.  Symptoms include a warmth over, her body followed by dizziness and sometimes nausea with vomiting.  She has experienced multiple near syncope and syncopal episodes with falls.  She describes her symptoms as room spinning sensation, but also feels imbalanced. She's passed out 6 times since August 2022. Her cardiologist completed an echocardiogram and carotid ultrasound in September 2022.  ? ?During her recent hospital stay for left shoulder reconstruction surgery she had only one episode of dizziness during her hospital stay. This was a significant improvement compared to last several months.  She was in and out of bed frequently as she worked with PT and OT. Her isosorbide was discontinued during her hospital stay.  ? ? ?Review of Systems  ?Respiratory:  Negative for shortness of breath.   ?Cardiovascular:  Negative for chest pain.  ?Neurological:  Positive for dizziness, syncope and light-headedness.  ?Psychiatric/Behavioral:  The patient is nervous/anxious.   ?     See HPI  ? ?   ? ? ?Past Medical History:  ?Diagnosis Date  ? Aortic atherosclerosis ([place])   ? Bilateral cataracts 01/2015  ? a.) s/p extraction  ? CHF (congestive heart failure) ([place]) 09/16/2013  ? a.) TTE 09/16/2013: EF 55-60%; mod inferior HK, triv TR; G1DD. b.) TTE 01/02/2016: EF 25-30%; mid-apicalateroseptal, lat, inf, inferoseptal, apical akinesis; triv TR; G1DD. c.  TTE 04/16/2016: EF 65%; G1DD. d.) TTE 05/02/2020: EF 50-55%; G2DD. e.) TTE 04/11/2021: EF 55%; G1DD.  ? Chronic low back pain   ? Clostridioides difficile infection 2015  ? COPD (chronic obstructive pulmonary disease) ([place])   ? Coronary artery disease   ? a.) PCI 02/06/2011: 100% mLCx - DES x  3. b.) PCI 09/17/2011: 95% dRCA - DES x 1. c.) PCI 10/02/2011: 75% pLCX - DES x 1. d.) PCI 08/19/2012: DES x 2 p-mRCA.  e.) [place] 01/01/16: Takotsubo event 01/01/2016 with patent stents  ? Depression   ? Diverticulosis   ? Frequent PVCs   ? a. Noted in hospital 12/2015.  ? GERD (gastroesophageal reflux disease)   ? History of heart artery stent   ? a.) TOTAL of 7 stents: a.) 02/06/2011 - overlapping 2.5x12 mm Xience V, 2.5x8 mm Xience V, 2.25x8 mm Xience Nano to mLCx. b.) 09/17/2011 - 2.5x23 Xience V dRCA. c.) 10/02/2011 - 2.5x15 mm Xience V pLCx. d.) 08/09/2012 - overlapping 3.0x34m mRCA and 3.5x27mpRCA  ? Hyperlipidemia   ? Hypertension   ? Hypothyroidism   ? Impingement syndrome of left shoulder   ? LBBB (left bundle branch block)   ? Long term current use of antithrombotics/antiplatelets   ? a.) clopidogrel  ? Marijuana abuse   ? [facility]  ? a.) multiple MIs;  5 per her report  ? OSA (obstructive sleep apnea)   ? a.) mild; does not require nocturnal PAP therapy  ? Paroxysmal SVT ([place]raventricular tachycardia) [place]  ? a.)  Zio patch 05/08/2021: 3 distinct SVT runs; fastest 5 beats at a rate of 146 bpm; longest 7 beats at rate of 134 bpm  ? T[place]DM (type 2 diabetes mellitus) [place]  ? Takotsubo cardiomyopathy   ? a. 12/2015 - nephew committed suicide 1 week prior, sister died the morning of presentation - initially called a STEMI; cath with patent stents. LVEF 25-30%.  ? Tendonitis of left rotator cuff   ? Tobacco abuse   ? Vascular dementia   ? ? ?Social History  ? ?Socioeconomic History  ? Marital status: Married  ?  Spouse name: Not on file  ? Number of children: Not on file  ? Years of education: Not on file  ? Highest education level: Not on file  ?Occupational History  ? Not on file  ?Tobacco Use  ? Smoking status: Every Day  ?  Packs/day: 1.00  ?  Years: 45.00  ?  Pack years: 45.00  ?  Types: Cigarettes  ? Smokeless tobacco: Never  ? Tobacco comments:  ?  Has cut back, trying to  quit.   ?Vaping Use  ? Vaping Use: Never used  ?Substance and Sexual Activity  ? Alcohol use: No  ?  Alcohol/week: 0.0 standard drinks  ? Drug use: Yes  ?  Types: Marijuana  ?  Comment: last noc  ? Sexual activity: Not on file  ?Other Topics Concern  ? Not on file  ?Social History Narrative  ? [place] used [place] - quit.  ?   ? Regular exercise: no/ pain from a frozen rotator cuff  ? Caffeine use: coffee daily and pepsi  ?   ? Does not have a living will.  ? Daughters and husband know her wishes- would desire CPR but not prolonged life support if futile  ? ?Social Determinants of Health  ? ?Financial Resource Strain: Low Risk   ?  Difficulty of Paying Living Expenses: Not hard at all  ?Food Insecurity: No Food Insecurity  ? Worried About Charity fundraiser in the Last Year: Never true  ? Ran Out of Food in the Last Year: Never true  ?Transportation Needs: No Transportation Needs  ? Lack of Transportation (Medical): No  ? Lack of Transportation (Non-Medical): No  ?Physical Activity: Inactive  ? Days of Exercise per Week: 0 days  ? Minutes of Exercise per Session: 0 min  ?Stress: No Stress Concern Present  ? Feeling of Stress : Not at all  ?Social Connections: Not on file  ?Intimate Partner Violence: Not At Risk  ? Fear of Current or Ex-Partner: No  ? Emotionally Abused: No  ? Physically Abused: No  ? Sexually Abused: No  ? ? ?Past Surgical History:  ?Procedure Laterality Date  ? ABDOMINAL AORTOGRAM W/LOWER EXTREMITY N/A 09/12/2020  ? Procedure: ABDOMINAL AORTOGRAM W/LOWER EXTREMITY;  Surgeon: Wellington Hampshire, MD;  Location: [facility];  Service: Cardiovascular;  Laterality: N/A;  ? ABDOMINAL HYSTERECTOMY    ? APPENDECTOMY    ? BACK SURGERY    ? CARDIAC CATHETERIZATION N/A 01/01/2016  ? Procedure: Left Heart Cath and Coronary Angiography;  Surgeon: Jettie Booze, MD;  Location: [facility];  Service: Cardiovascular;  Laterality: N/A;  ?  CATARACT EXTRACTION W/PHACO Left 01/30/2015  ? Procedure: CATARACT EXTRACTION PHACO AND INTRAOCULAR LENS PLACEMENT (IOC);  Surgeon: Birder Robson, MD;  Location: [facility];  Service: Ophthalmology;  Laterality

## 2021-10-11 NOTE — Assessment & Plan Note (Signed)
Likely multifactorial given her significant heart disease, also exhibits symptoms of vertigo. ? ?Follows closely with cardiology already. ?Referral placed to ENT for evaluation of vertigo patient request. ? ? ?

## 2021-10-11 NOTE — Assessment & Plan Note (Signed)
Reviewed hospital discharge notes from March 2023. ? ?Following with orthopedic surgery, PT/OT. ?

## 2021-10-11 NOTE — Patient Instructions (Addendum)
You will be contacted regarding your referral to ENT.  Please let us know if you have not been contacted within two weeks.  ? ?You may take mirtazapine 7.5 mg at bedtime for depression, appetite, and sleep. ? ?Resume Lantus insulin at 10 units nightly. ?Continue NovoLog once daily as you have been doing so. ? ?Mostly monitor your sugars, notify me if you see drops again or consistent readings above 150. ? ?Reschedule your appointment for the week of June 26th.  ? ?It was a pleasure to see you today! ? ?

## 2021-10-15 ENCOUNTER — Telehealth: Payer: Self-pay | Admitting: Primary Care

## 2021-10-15 NOTE — Telephone Encounter (Addendum)
-----   Message from [name], NP sent at 05/16/2021  5:44 PM EDT ----- ?Regarding: Lung Nodule ?Due for repeat CT scan for a lung nodule that was noted on CT scan from October 2022. ? ?Is she agreeable to have this repeated? ? ?

## 2021-10-15 NOTE — Telephone Encounter (Signed)
No answer no voice mail  

## 2021-10-16 DIAGNOSIS — R29898 Other symptoms and signs involving the musculoskeletal system: Secondary | ICD-10-CM | POA: Diagnosis not present

## 2021-10-16 DIAGNOSIS — M25612 Stiffness of left shoulder, not elsewhere classified: Secondary | ICD-10-CM | POA: Diagnosis not present

## 2021-10-16 DIAGNOSIS — M25512 Pain in left shoulder: Secondary | ICD-10-CM | POA: Diagnosis not present

## 2021-10-16 DIAGNOSIS — Z96612 Presence of left artificial shoulder joint: Secondary | ICD-10-CM | POA: Diagnosis not present

## 2021-10-17 NOTE — Telephone Encounter (Signed)
Called patient she did not want to make any appointments at this time. Will call if changes her  mind.  ?

## 2021-10-17 NOTE — Telephone Encounter (Signed)
Noted  

## 2021-10-22 ENCOUNTER — Ambulatory Visit: Payer: Medicare PPO | Admitting: Primary Care

## 2021-10-23 ENCOUNTER — Telehealth: Payer: Self-pay

## 2021-10-23 DIAGNOSIS — M25512 Pain in left shoulder: Secondary | ICD-10-CM | POA: Diagnosis not present

## 2021-10-23 DIAGNOSIS — Z96612 Presence of left artificial shoulder joint: Secondary | ICD-10-CM | POA: Diagnosis not present

## 2021-10-23 NOTE — Progress Notes (Signed)
? ? ?  Chronic Care Management ?Pharmacy Assistant  ? ?Name: Sarah Payne  MRN: [id number] DOB: 08-03-1945 ? ?Reason for Encounter: CCM Counsellor) ?  ?Medications: ?Outpatient Encounter Medications as of 10/23/2021  ?Medication Sig  ? albuterol (VENTOLIN HFA) 108 (90 Base) MCG/ACT inhaler Inhale 2 puffs into the lungs every 6 (six) hours as needed for wheezing or shortness of breath.  ? amitriptyline (ELAVIL) 50 MG tablet Take 1 tablet (50 mg total) by mouth at bedtime as needed for sleep.  ? carvedilol (COREG) 3.125 MG tablet Take 1 tablet (3.125 mg total) by mouth 2 (two) times daily.  ? clopidogrel (PLAVIX) 75 MG tablet Take 1 tablet (75 mg total) by mouth daily.  ? Continuous Blood Gluc Receiver (FREESTYLE LIBRE 2 READER) DEVI Use with sensor to check blood sugars 6 times daily.  ? Continuous Blood Gluc Sensor (FREESTYLE LIBRE 2 SENSOR) MISC USE TO CHECK SUGARS 6 TIMES DAILY  ? enalapril (VASOTEC) 10 MG tablet TAKE 1 TABLET BY MOUTH ONCE DAILY  ? ezetimibe (ZETIA) 10 MG tablet Take 1 tablet (10 mg total) by mouth daily. For cholesterol.  ? furosemide (LASIX) 20 MG tablet Take one tab only on Monday, Wednesday, & Fridays  ? gabapentin (NEURONTIN) 300 MG capsule Take 1 capsule (300 mg total) by mouth 3 (three) times daily.  ? glucose blood (FREESTYLE LITE) test strip Test up to four times a day as needed. DX  ? insulin aspart (NOVOLOG FLEXPEN) 100 UNIT/ML FlexPen Inject 10 Units into the skin 2 (two) times daily before a meal.  ? insulin glargine (LANTUS SOLOSTAR) 100 UNIT/ML Solostar Pen Inject 60 Units into the skin at bedtime. For diabetes.  ? Insulin Pen Needle (INSUPEN PEN NEEDLES) 32G X 4 MM MISC Use to inject insulin 4 times daily.  ? levothyroxine (SYNTHROID) 75 MCG tablet Take 1 tablet (75 mcg total) by mouth daily at 6 (six) AM.  ? mirtazapine (REMERON) 7.5 MG tablet Take 1 tablet (7.5 mg total) by mouth at bedtime. For appetite and sleep.  ? ondansetron (ZOFRAN) 4 MG tablet Take 1 tablet (4 mg  total) by mouth every 6 (six) hours as needed for nausea.  ? oxyCODONE (ROXICODONE) 5 MG immediate release tablet Take 1-2 tablets (5-10 mg total) by mouth every 4 (four) hours as needed for moderate pain or severe pain.  ? potassium chloride (KLOR-CON) 10 MEQ tablet Only take on days you take your lasix. Take 1 tablet (10 mEq total) by mouth for 1 dose on those days you take your lasix '20mg'$  pill.  ? rosuvastatin (CRESTOR) 40 MG tablet Take 1 tablet (40 mg total) by mouth every evening. For cholesterol.  ? traZODone (DESYREL) 100 MG tablet TAKE 1 TABLET BY MOUTH AT BEDTIME AS NEEDED FOR SLEEP  ? ?No facility-administered encounter medications on file as of 10/23/2021.  ? ?Sarah Payne was contacted to remind of upcoming telephone visit with Charlene Brooke on 10/28/2021 at 11:00. Patient cancelled appointment as she feels she does not need to speak with Charlene Brooke.  ? ?Are you having any problems with your medications? No  ? ?Do you have any concerns you like to discuss with the pharmacist? No ? ?Star Rating Drugs: ?Medication:  Last Fill: Day Supply ?Enalapril 10 mg 09/09/2021 90 ?Rosuvastatin 40 mg No date given ?Filled by [facility] ? ?Charlene Brooke, CPP notified ? ?[name], RMA ?Clinical Pharmacy Assistant ?[phone] ? ? ? ? ? ?

## 2021-10-28 ENCOUNTER — Telehealth: Payer: Medicare PPO

## 2021-12-02 DIAGNOSIS — R490 Dysphonia: Secondary | ICD-10-CM | POA: Diagnosis not present

## 2021-12-02 DIAGNOSIS — R42 Dizziness and giddiness: Secondary | ICD-10-CM | POA: Diagnosis not present

## 2021-12-08 ENCOUNTER — Telehealth: Payer: Self-pay | Admitting: Primary Care

## 2021-12-08 DIAGNOSIS — E1151 Type 2 diabetes mellitus with diabetic peripheral angiopathy without gangrene: Secondary | ICD-10-CM

## 2021-12-11 MED ORDER — FREESTYLE LIBRE 2 SENSOR MISC: 1 refills | Status: DC

## 2021-12-11 NOTE — Telephone Encounter (Signed)
I sent a 6 month supply to her pharmacy in February 2023 so not sure what's going on there. I will refill anyway.

## 2021-12-11 NOTE — Telephone Encounter (Signed)
Pt said she is out and said the pharmacy was waiting on [place] to respond, call back is [phone]

## 2022-01-07 ENCOUNTER — Other Ambulatory Visit: Payer: Self-pay

## 2022-01-07 ENCOUNTER — Telehealth: Payer: Self-pay | Admitting: Student in an Organized Health Care Education/Training Program

## 2022-01-07 ENCOUNTER — Encounter: Payer: Medicare PPO | Admitting: Student in an Organized Health Care Education/Training Program

## 2022-01-07 NOTE — Telephone Encounter (Signed)
Called the patient and she states she will not be out of medications  today and was fine with the appointment change.

## 2022-01-07 NOTE — Telephone Encounter (Signed)
Patient is out of meds today and had to be rescheduled. Please send to Dr. Holley Raring today.

## 2022-01-14 ENCOUNTER — Encounter: Payer: Self-pay | Admitting: Primary Care

## 2022-01-14 ENCOUNTER — Ambulatory Visit (INDEPENDENT_AMBULATORY_CARE_PROVIDER_SITE_OTHER): Payer: Medicare PPO | Admitting: Primary Care

## 2022-01-14 VITALS — BP 138/64 | HR 62 | Temp 98.6°F | Ht 67.0 in | Wt 141.0 lb

## 2022-01-14 DIAGNOSIS — F4323 Adjustment disorder with mixed anxiety and depressed mood: Secondary | ICD-10-CM | POA: Diagnosis not present

## 2022-01-14 DIAGNOSIS — J432 Centrilobular emphysema: Secondary | ICD-10-CM

## 2022-01-14 DIAGNOSIS — G8929 Other chronic pain: Secondary | ICD-10-CM

## 2022-01-14 DIAGNOSIS — M549 Dorsalgia, unspecified: Secondary | ICD-10-CM | POA: Diagnosis not present

## 2022-01-14 DIAGNOSIS — E1151 Type 2 diabetes mellitus with diabetic peripheral angiopathy without gangrene: Secondary | ICD-10-CM | POA: Diagnosis not present

## 2022-01-14 DIAGNOSIS — I2510 Atherosclerotic heart disease of native coronary artery without angina pectoris: Secondary | ICD-10-CM | POA: Diagnosis not present

## 2022-01-14 DIAGNOSIS — G894 Chronic pain syndrome: Secondary | ICD-10-CM

## 2022-01-14 DIAGNOSIS — M7918 Myalgia, other site: Secondary | ICD-10-CM

## 2022-01-14 DIAGNOSIS — I10 Essential (primary) hypertension: Secondary | ICD-10-CM

## 2022-01-14 DIAGNOSIS — I5022 Chronic systolic (congestive) heart failure: Secondary | ICD-10-CM

## 2022-01-14 DIAGNOSIS — E039 Hypothyroidism, unspecified: Secondary | ICD-10-CM

## 2022-01-14 DIAGNOSIS — E782 Mixed hyperlipidemia: Secondary | ICD-10-CM

## 2022-01-14 LAB — URIC ACID: Uric Acid, Serum: 3.7 mg/dL (ref 2.4–7.0)

## 2022-01-14 LAB — POCT GLYCOSYLATED HEMOGLOBIN (HGB A1C): Hemoglobin A1C: 7.8 % — AB (ref 4.0–5.6)

## 2022-01-14 LAB — TSH: TSH: 1.92 u[IU]/mL (ref 0.35–5.50)

## 2022-01-14 MED ORDER — ALBUTEROL SULFATE HFA 108 (90 BASE) MCG/ACT IN AERS: 2.0000 | INHALATION_SPRAY | Freq: Four times a day (QID) | RESPIRATORY_TRACT | 0 refills | Status: DC | PRN

## 2022-01-14 NOTE — Assessment & Plan Note (Addendum)
Hold rosuvastatin x 2 weeks, if symptoms improve then she will discuss with cardiology, if no improvement then she will resume

## 2022-01-14 NOTE — Assessment & Plan Note (Signed)
Following with pain management. Continue gabapentin 300 mg 3 times daily and continue Tylenol will as needed.

## 2022-01-14 NOTE — Assessment & Plan Note (Signed)
Improved with A1c is 7.8 today!!  Discontinue Lantus for now. Continue NovoLog 20 units in the morning, reduce NovoLog to 15 units in the afternoon, continue NovoLog 20 units in the evening.  Foot exam today. Managed on statin and ACE inhibitor. Pneumonia vaccine up-to-date.  Follow-up in 3 months.

## 2022-01-14 NOTE — Assessment & Plan Note (Signed)
TSH too low in March 2023. Repeat TSH pending.  Continue levothyroxine as 75 mcg daily for now.

## 2022-01-14 NOTE — Assessment & Plan Note (Signed)
Appears euvolemic today.  Continue carvedilol 3.125 mg twice daily, furosemide 20 mg 3 days weekly, potassium chloride 10-20 mEq daily. Following with cardiology.

## 2022-01-16 ENCOUNTER — Encounter: Payer: Medicare PPO | Admitting: Student in an Organized Health Care Education/Training Program

## 2022-01-30 ENCOUNTER — Ambulatory Visit
Payer: Medicare PPO | Attending: Student in an Organized Health Care Education/Training Program | Admitting: Student in an Organized Health Care Education/Training Program

## 2022-01-30 ENCOUNTER — Encounter: Payer: Self-pay | Admitting: Student in an Organized Health Care Education/Training Program

## 2022-01-30 VITALS — BP 152/87 | HR 70 | Temp 97.0°F | Resp 18 | Ht 66.0 in | Wt 143.0 lb

## 2022-01-30 DIAGNOSIS — Z720 Tobacco use: Secondary | ICD-10-CM | POA: Insufficient documentation

## 2022-01-30 DIAGNOSIS — M7918 Myalgia, other site: Secondary | ICD-10-CM | POA: Diagnosis not present

## 2022-01-30 DIAGNOSIS — G8929 Other chronic pain: Secondary | ICD-10-CM | POA: Diagnosis not present

## 2022-01-30 DIAGNOSIS — G629 Polyneuropathy, unspecified: Secondary | ICD-10-CM | POA: Insufficient documentation

## 2022-01-30 DIAGNOSIS — G894 Chronic pain syndrome: Secondary | ICD-10-CM | POA: Insufficient documentation

## 2022-01-30 MED ORDER — GABAPENTIN 600 MG PO TABS: 600.0000 mg | ORAL_TABLET | Freq: Two times a day (BID) | ORAL | 5 refills | Status: DC

## 2022-01-30 NOTE — Progress Notes (Signed)
Safety precautions to be maintained throughout the outpatient stay will include: orient to surroundings, keep bed in low position, maintain call bell within reach at all times, provide assistance with transfer out of bed and ambulation.  

## 2022-01-30 NOTE — Progress Notes (Signed)
PROVIDER NOTE: Information contained herein reflects review and annotations entered in association with encounter. Interpretation of such information and data should be left to medically-trained personnel. Information provided to patient can be located elsewhere in the medical record under "Patient Instructions". Document created using STT-dictation technology, any transcriptional errors that may result from process are unintentional.    Patient: Sarah Payne  Service Category: E/M  Provider: Gillis Santa, MD  DOB: May 28, 1946  DOS: 01/30/2022  Specialty: Interventional Pain Management  MRN: [id number]  Setting: Ambulatory outpatient  PCP: [name], NP  Type: Established Patient    Referring Provider: [name], NP  Location: Office  Delivery: Face-to-face     HPI  [name], a 76 y.o. year old female, is here today because of her Chronic musculoskeletal pain [M79.18, G89.29]. [name] primary complain today is Back Pain  Last encounter: My last encounter with her was on 07/09/21  Pertinent problems: Ms. Aldaba has CAD, multiple vessel; History of lumbar fusion; Diabetes mellitus type 2 with peripheral artery disease ([place]); Bronchitis, chronic obstructive ([place]); Obstructive sleep apnea; COPD (chronic obstructive pulmonary disease) ([place]); Lumbar degenerative disc disease; Lumbar spondylosis with myelopathy; Cannabis use disorder, mild, abuse; Peripheral polyneuropathy; Tobacco dependence; Chronic pain syndrome; Chronic neck pain; Chronic pain of right upper extremity; Cervical spondylosis; and Chronic musculoskeletal pain on their pertinent problem list. Pain Assessment: Severity of Chronic pain is reported as a 0-No pain/10. Location: Back Mid, Lower, Upper/denies. Onset: More than a month ago. Quality: Hervey Ard, Aching. Timing: Constant. Modifying factor(s): gabapentin. Vitals:  height is $RemoveB'5\' 6"'vLtcffPi$  (1.676 m) and weight is 143 lb (64.9 kg). Her temporal temperature is 97 F (36.1  C) (abnormal). Her blood pressure is 152/87 (abnormal) and her pulse is 70. Her respiration is 18 and oxygen saturation is 98%.   Reason for encounter: medication management.   -Patient is status post reverse shoulder arthroplasty with biceps tenodesis with Dr. Arnette Schaumann on 10/01/2021.  She states that the surgery was helpful and that she is recovering well. -She is here for refill of gabapentin which she takes 300 mg 3 times a day.  She has decreased kidney function and given that her GFR is 33, we have to reduce her gabapentin frequency to twice daily.  Maximum daily dose is 1400 mg in 2 divided doses given her GFR.  We will change her gabapentin dosing to 600 mg twice a day and see how she does with that.  ROS  Constitutional: Denies any fever or chills Gastrointestinal: No reported hemesis, hematochezia, vomiting, or acute GI distress Musculoskeletal:  Low back pain, mild, left shoulder pain significantly improved Neurological: No reported episodes of acute onset apraxia, aphasia, dysarthria, agnosia, amnesia, paralysis, loss of coordination, or loss of consciousness  Medication Review  FreeStyle Libre 2 Reader, FreeStyle Libre 2 Sensor, Insulin Pen Needle, albuterol, amitriptyline, carvedilol, clopidogrel, enalapril, ezetimibe, furosemide, gabapentin, glucose blood, insulin aspart, insulin glargine, levothyroxine, mirtazapine, ondansetron, potassium chloride, rosuvastatin, and traZODone  History Review  Allergy: Ms. Ramires has No Known Allergies. Drug: Ms. Porcaro  reports current drug use. Drug: Marijuana. Alcohol:  reports no history of alcohol use. Tobacco:  reports that she has been smoking cigarettes. She has a 45.00 pack-year smoking history. She has never used smokeless tobacco. Social: Ms. Meints  reports that she has been smoking cigarettes. She has a 45.00 pack-year smoking history. She has never used smokeless tobacco. She reports current drug use. Drug: Marijuana. She reports that she  does not drink  alcohol. Medical:  has a past medical history of Aortic atherosclerosis ([place]), Bilateral cataracts (01/2015), CHF (congestive heart failure) ([place]) (09/16/2013), Chronic low back pain, Clostridioides difficile infection (2015), COPD (chronic obstructive pulmonary disease) ([place]), Coronary artery disease, Depression, Diverticulosis, Frequent PVCs, GERD (gastroesophageal reflux disease), History of heart artery stent, Hyperlipidemia, Hypertension, Hypothyroidism, Impingement syndrome of left shoulder, LBBB (left bundle branch block), Long term current use of antithrombotics/antiplatelets, Marijuana abuse, Myocardial infarction ([place]), OSA (obstructive sleep apnea), Paroxysmal SVT (supraventricular tachycardia) ([place]) (05/08/2021), T2DM (type 2 diabetes mellitus) ([place]), Takotsubo cardiomyopathy, Tendonitis of left rotator cuff, Tobacco abuse, and Vascular dementia. Surgical: Ms. Laborde  has a past surgical history that includes Back surgery; Appendectomy; Abdominal hysterectomy; Colonoscopy (N/A, 11/02/2014); Cataract extraction w/PHACO (Left, 01/30/2015); Coronary angioplasty with stent (Left, 02/06/2011); Coronary angioplasty with stent (Left, 09/17/2011); Cataract extraction w/PHACO (Right, 02/13/2015); Cardiac catheterization (N/A, 01/01/2016); Esophagogastroduodenoscopy (egd) with propofol (N/A, 09/21/2018); Shoulder surgery (Left, 2017); ABDOMINAL AORTOGRAM W/LOWER EXTREMITY (N/A, 09/12/2020); PERIPHERAL VASCULAR ATHERECTOMY (Left, 09/12/2020); PERIPHERAL VASCULAR BALLOON ANGIOPLASTY (09/12/2020); Coronary angioplasty with stent (Left, 10/02/2011); Coronary angioplasty with stent (Left, 08/19/2012); LEFT HEART CATH AND CORONARY ANGIOGRAPHY (Left, 08/02/2014); and Reverse shoulder arthroplasty (Left, 10/01/2021). Family: family history includes Colon cancer in her sister; Heart attack in her mother; Heart disease in her father and mother; Liver cancer in her brother; Throat cancer in her  brother.  Laboratory Chemistry Profile   Renal Lab Results  Component Value Date   BUN 39 (H) 10/03/2021   CREATININE 1.64 (H) 10/03/2021   BCR 17 04/19/2021   GFR 44.80 (L) 08/03/2020   GFRAA 55 (L) 08/30/2020   GFRNONAA 33 (L) 10/03/2021     Hepatic Lab Results  Component Value Date   AST 23 09/24/2021   ALT 21 09/24/2021   ALBUMIN 3.9 09/24/2021   ALKPHOS 66 09/24/2021   HCVAB NEGATIVE 03/12/2015   AMYLASE 32 04/11/2016   LIPASE 55.0 04/11/2016     Electrolytes Lab Results  Component Value Date   NA 135 10/03/2021   K 4.3 10/03/2021   CL 99 10/03/2021   CALCIUM 9.1 10/03/2021   MG 1.6 (L) 10/03/2021     Bone Lab Results  Component Value Date   VD25OH 41 02/25/2013     Inflammation (CRP: Acute Phase) (ESR: Chronic Phase) Lab Results  Component Value Date   CRP 2.1 04/11/2013   ESRSEDRATE 58 (H) 04/11/2013       Note: Above Lab results reviewed.  Recent Imaging Review  ECHOCARDIOGRAM COMPLETE    ECHOCARDIOGRAM REPORT       Patient Name:   ALEIDA CRANDELL Date of Exam: 10/03/2021 Medical Rec #:  [id number]     Height:       67.0 in Accession #:    [id number]    Weight:       143.4 lb Date of Birth:  Dec 20, 1945     BSA:          1.756 m Patient Age:    11 years      BP:           125/57 mmHg Patient Gender: F             HR:           96 bpm. Exam Location:  [facility]  Procedure: 2D Echo, Color Doppler and Cardiac Doppler  Indications:     CHF-acute diastolic W29.56   History:         Patient has prior history of Echocardiogram examinations, most  recent 04/11/2021. CHF, COPD; Risk Factors:Hypertension.   Sonographer:     Sherrie Sport Referring Phys:  [place] Diagnosing Phys: Ida Rogue MD    Sonographer Comments: No parasternal window, suboptimal apical window and no subcostal window. IMPRESSIONS   1. Challenging images  2. Left ventricular ejection fraction, by estimation, is 55 to 60%. The left ventricle has normal  function. Septal/anteroseptal wall motion abnormality possibly secondary to LBBB. Left ventricular diastolic parameters are consistent with Grade I  diastolic dysfunction (impaired relaxation).  3. Right ventricular systolic function is normal. The right ventricular size is normal. There is normal pulmonary artery systolic pressure. The estimated right ventricular systolic pressure is 34.1 mmHg.  4. The mitral valve is normal in structure. No evidence of mitral valve regurgitation. No evidence of mitral stenosis.  5. The aortic valve was not well visualized. Aortic valve regurgitation is not visualized. No aortic stenosis is present.  6. The inferior vena cava is normal in size with greater than 50% respiratory variability, suggesting right atrial pressure of 3 mmHg.  FINDINGS  Left Ventricle: Left ventricular ejection fraction, by estimation, is 55 to 60%. The left ventricle has normal function. The left ventricle has no regional wall motion abnormalities. The left ventricular internal cavity size was normal in size. There is  no left ventricular hypertrophy. Left ventricular diastolic parameters are consistent with Grade I diastolic dysfunction (impaired relaxation).  Right Ventricle: The right ventricular size is normal. No increase in right ventricular wall thickness. Right ventricular systolic function is normal. There is normal pulmonary artery systolic pressure. The tricuspid regurgitant velocity is 2.09 m/s, and  with an assumed right atrial pressure of 5 mmHg, the estimated right ventricular systolic pressure is 93.7 mmHg.  Left Atrium: Left atrial size was normal in size.  Right Atrium: Right atrial size was normal in size.  Pericardium: There is no evidence of pericardial effusion.  Mitral Valve: The mitral valve is normal in structure. No evidence of mitral valve regurgitation. No evidence of mitral valve stenosis. MV peak gradient, 5.9 mmHg. The mean mitral valve gradient is 2.0  mmHg.  Tricuspid Valve: The tricuspid valve is normal in structure. Tricuspid valve regurgitation is not demonstrated. No evidence of tricuspid stenosis.  Aortic Valve: The aortic valve was not well visualized. Aortic valve regurgitation is not visualized. No aortic stenosis is present. Aortic valve mean gradient measures 3.0 mmHg. Aortic valve peak gradient measures 5.0 mmHg. Aortic valve area, by VTI  measures 3.86 cm.  Pulmonic Valve: The pulmonic valve was normal in structure. Pulmonic valve regurgitation is not visualized. No evidence of pulmonic stenosis.  Aorta: The aortic root is normal in size and structure.  Venous: The inferior vena cava is normal in size with greater than 50% respiratory variability, suggesting right atrial pressure of 3 mmHg.  IAS/Shunts: No atrial level shunt detected by color flow Doppler.    LEFT VENTRICLE PLAX 2D LVIDd:         2.80 cm   Diastology LVIDs:         2.00 cm   LV e' medial:    4.03 cm/s LV PW:         1.00 cm   LV E/e' medial:  24.3 LV IVS:        1.50 cm   LV e' lateral:   5.66 cm/s LVOT diam:     2.00 cm   LV E/e' lateral: 17.3 LV SV:         56 LV  SV Index:   32 LVOT Area:     3.14 cm    RIGHT VENTRICLE RV S prime:     11.50 cm/s  LEFT ATRIUM           Index        RIGHT ATRIUM           Index LA diam:      2.70 cm 1.54 cm/m   RA Area:     16.50 cm LA Vol (A2C): 15.2 ml 8.66 ml/m   RA Volume:   44.20 ml  25.18 ml/m LA Vol (A4C): 25.8 ml 14.70 ml/m  AORTIC VALVE AV Area (Vmax):    2.86 cm AV Area (Vmean):   2.98 cm AV Area (VTI):     3.86 cm AV Vmax:           112.00 cm/s AV Vmean:          78.200 cm/s AV VTI:            0.144 m AV Peak Grad:      5.0 mmHg AV Mean Grad:      3.0 mmHg LVOT Vmax:         102.00 cm/s LVOT Vmean:        74.200 cm/s LVOT VTI:          0.177 m LVOT/AV VTI ratio: 1.23  MITRAL VALVE               TRICUSPID VALVE MV Area (PHT): 7.02 cm    TR Peak grad:   17.5 mmHg MV Area VTI:    2.63 cm    TR Vmax:        209.00 cm/s MV Peak grad:  5.9 mmHg MV Mean grad:  2.0 mmHg    SHUNTS MV Vmax:       1.21 m/s    Systemic VTI:  0.18 m MV Vmean:      66.1 cm/s   Systemic Diam: 2.00 cm MV Decel Time: 108 msec MV E velocity: 98.10 cm/s MV A velocity: 77.10 cm/s MV E/A ratio:  1.27  Ida Rogue MD Electronically signed by Ida Rogue MD Signature Date/Time: 10/03/2021/12:32:19 PM      Final    Note: Reviewed        Physical Exam  General appearance: Well nourished, well developed, and well hydrated. In no apparent acute distress Mental status: Alert, oriented x 3 (person, place, & time)       Respiratory: No evidence of acute respiratory distress Eyes: PERLA Vitals: BP (!) 152/87   Pulse 70   Temp (!) 97 F (36.1 C) (Temporal)   Resp 18   Ht 5' 6"  (1.676 m)   Wt 143 lb (64.9 kg)   SpO2 98%   BMI 23.08 kg/m  BMI: Estimated body mass index is 23.08 kg/m as calculated from the following:   Height as of this encounter: 5' 6"  (1.676 m).   Weight as of this encounter: 143 lb (64.9 kg). Ideal: Ideal body weight: 59.3 kg (130 lb 11.7 oz) Adjusted ideal body weight: 61.5 kg (135 lb 10.2 oz)  Improvement in left shoulder pain and range of motion  Lumbar Spine Area Exam  Skin & Axial Inspection: No masses, redness, or swelling Alignment: Symmetrical Functional ROM: Pain restricted ROM       Stability: No instability detected Muscle Tone/Strength: Functionally intact. No obvious neuro-muscular anomalies detected. Sensory (Neurological): Musculoskeletal pain pattern  Lower Extremity Exam    Side: Right  lower extremity  Side: Left lower extremity  Stability: No instability observed          Stability: No instability observed          Skin & Extremity Inspection: Skin color, temperature, and hair growth are WNL. No peripheral edema or cyanosis. No masses, redness, swelling, asymmetry, or associated skin lesions. No contractures.  Skin & Extremity Inspection: Skin  color, temperature, and hair growth are WNL. No peripheral edema or cyanosis. No masses, redness, swelling, asymmetry, or associated skin lesions. No contractures.  Functional ROM: Diminished ROM of the right hip joint, right SI joint                  Functional ROM: Unrestricted ROM                  Muscle Tone/Strength: Functionally intact. No obvious neuro-muscular anomalies detected.  Muscle Tone/Strength: Functionally intact. No obvious neuro-muscular anomalies detected.  Sensory (Neurological): Musculoskeletal pain pattern        Sensory (Neurological): Unimpaired        DTR: Patellar: deferred today Achilles: deferred today Plantar: deferred today  DTR: Patellar: deferred today Achilles: deferred today Plantar: deferred today  Palpation: No palpable anomalies  Palpation: No palpable anomalies    Assessment   Status Diagnosis  Controlled Controlled Controlled 1. Chronic musculoskeletal pain   2. Peripheral polyneuropathy   3. Chronic pain syndrome   4. Tobacco abuse      Plan of Care   [name] has a current medication list which includes the following long-term medication(s): albuterol, amitriptyline, carvedilol, enalapril, ezetimibe, furosemide, gabapentin, gabapentin, novolog flexpen, levothyroxine, mirtazapine, potassium chloride, rosuvastatin, trazodone, and lantus solostar.  Pharmacotherapy (Medications Ordered): Meds ordered this encounter  Medications   gabapentin (NEURONTIN) 600 MG tablet    Sig: Take 1 tablet (600 mg total) by mouth every 12 (twelve) hours.    Dispense:  60 tablet    Refill:  5    Fill one day early if pharmacy is closed on scheduled refill date. May substitute for generic if available.    No orders of the defined types were placed in this encounter.  Smoking cessation instruction/counseling given:  counseled patient on the dangers of tobacco use, advised patient to stop smoking, and reviewed strategies to maximize  success  Follow-up plan:   Return in about 6 months (around 08/02/2022) for Medication Management, in person.   Recent Visits No visits were found meeting these conditions. Showing recent visits within past 90 days and meeting all other requirements Today's Visits Date Type Provider Dept  01/30/22 Office Visit Gillis Santa, MD [facility]  Showing today's visits and meeting all other requirements Future Appointments No visits were found meeting these conditions. Showing future appointments within next 90 days and meeting all other requirements  I discussed the assessment and treatment plan with the patient. The patient was provided an opportunity to ask questions and all were answered. The patient agreed with the plan and demonstrated an understanding of the instructions.  Patient advised to call back or seek an in-person evaluation if the symptoms or condition worsens.  Duration of encounter: 72mnutes.  Note by: BGillis Santa MD Date: 01/30/2022; Time: 10:54 AM

## 2022-02-04 ENCOUNTER — Telehealth: Payer: Self-pay | Admitting: Primary Care

## 2022-02-04 DIAGNOSIS — E1151 Type 2 diabetes mellitus with diabetic peripheral angiopathy without gangrene: Secondary | ICD-10-CM

## 2022-02-04 DIAGNOSIS — E039 Hypothyroidism, unspecified: Secondary | ICD-10-CM

## 2022-02-04 MED ORDER — NOVOLOG FLEXPEN 100 UNIT/ML ~~LOC~~ SOPN: PEN_INJECTOR | SUBCUTANEOUS | 0 refills | Status: DC

## 2022-02-04 MED ORDER — LEVOTHYROXINE SODIUM 75 MCG PO TABS: ORAL_TABLET | ORAL | 2 refills | Status: DC

## 2022-02-04 NOTE — Telephone Encounter (Signed)
  Encourage patient to contact the pharmacy for refills or they can request refills through [facility]  Did the patient contact the pharmacy:  no   LAST APPOINTMENT DATE:  Please schedule appointment if longer than 1 year  NEXT APPOINTMENT DATE:02/07/2022  MEDICATION:NovoLOG FlexPen insulin aspart (NOVOLOG FLEXPEN) 100 UNIT/ML FlexPen, levothyroxine (SYNTHROID) 75 MCG tablet  Is the patient out of medication? no  If not, how much is left? 3 flexpens, 2 weeks of levothyroxine  Is this a 90 day supply: yes  PHARMACY: [facility] [address] [place] Phone:  [phone]  Fax:  [phone]     [facility] [place], [place] - [address] Phone:  [phone]  Fax:  [phone]

## 2022-02-04 NOTE — Telephone Encounter (Signed)
Refills sent to [facility]. Tough to know which pharmacy she prefers as there are two listed.

## 2022-02-07 ENCOUNTER — Telehealth: Payer: Self-pay

## 2022-02-07 ENCOUNTER — Ambulatory Visit: Payer: Medicare PPO

## 2022-02-07 NOTE — Telephone Encounter (Signed)
Unsuccessful attempt to reach patient on preferred number listed in notes for scheduled AWV. Left message on voicemail okay to reschedule. 

## 2022-02-11 ENCOUNTER — Encounter: Payer: Self-pay | Admitting: Primary Care

## 2022-02-13 NOTE — Progress Notes (Unsigned)
Cardiology Office Note    Date:  02/13/2022   ID:  ALEZANDRA EGLI, DOB Dec 03, 1945, MRN [id number]  PCP:  [name], NP  Cardiologist:  Ida Rogue, MD  Electrophysiologist:  None   Chief Complaint: ***  History of Present Illness:   DEYCI GESELL is a 76 y.o. female with history of ***  ***   Labs independently reviewed: 12/2021 - TSH normal, A1c 7.8 09/2021 - magnesium 1.6, HGB 12.1, PLT 196, BUN 39, SCr 1.64, potassium 4.3 06/2021 - TC 112, TG 182, HDL 36, LDL 39  Past Medical History:  Diagnosis Date   Aortic atherosclerosis ([place])    Bilateral cataracts 01/2015   a.) s/p extraction   CHF (congestive heart failure) ([place]) 09/16/2013   a.) TTE 09/16/2013: EF 55-60%; mod inferior HK, triv TR; G1DD. b.) TTE 01/02/2016: EF 25-30%; mid-apicalateroseptal, lat, inf, inferoseptal, apical akinesis; triv TR; G1DD. c. TTE 04/16/2016: EF 65%; G1DD. d.) TTE 05/02/2020: EF 50-55%; G2DD. e.) TTE 04/11/2021: EF 55%; G1DD.   Chronic low back pain    Clostridioides difficile infection 2015   COPD (chronic obstructive pulmonary disease) (HCC)    Coronary artery disease    a.) PCI 02/06/2011: 100% mLCx - DES x 3. b.) PCI 09/17/2011: 95% dRCA - DES x 1. c.) PCI 10/02/2011: 75% pLCX - DES x 1. d.) PCI 08/19/2012: DES x 2 p-mRCA.  e.) [place] 01/01/16: Takotsubo event 01/01/2016 with patent stents   Depression    Diverticulosis    Frequent PVCs    a. Noted in hospital 12/2015.   GERD (gastroesophageal reflux disease)    History of heart artery stent    a.) TOTAL of 7 stents: a.) 02/06/2011 - overlapping 2.5x12 mm Xience V, 2.5x8 mm Xience V, 2.25x8 mm Xience Nano to mLCx. b.) 09/17/2011 - 2.5x23 Xience V dRCA. c.) 10/02/2011 - 2.5x15 mm Xience V pLCx. d.) 08/09/2012 - overlapping 3.0x31m mRCA and 3.5x213mpRCA   Hyperlipidemia    Hypertension    Hypothyroidism    Impingement syndrome of left shoulder    LBBB (left bundle branch block)    Long term current use of  antithrombotics/antiplatelets    a.) clopidogrel   Marijuana abuse    [facility]   a.) multiple MIs;  5 per her report   OSA (obstructive sleep apnea)    a.) mild; does not require nocturnal PAP therapy   Paroxysmal SVT (sup[place]ricular [place]cardia) [place]   a.)  Zio patch 05/08/2021: 3 distinct SVT runs; fastest 5 beats at a rate of 146 bpm; longest 7 beats at rate of 134 bpm   T2DM (type 2 diabetes mellitus) ([facility]   Takotsubo cardiomyopathy    a. 12/2015 - nephew committed suicide 1 week prior, sister died the morning of presentation - initially called a STEMI; cath with patent stents. LVEF 25-30%.   Tendonitis of left rotator cuff    Tobacco abuse    Vascular dementia     Past Surgical History:  Procedure Laterality Date   ABDOMINAL AORTOGRAM W/LOWER EXTREMITY N/A 09/12/2020   Procedure: ABDOMINAL AORTOGRAM W/LOWER EXTREMITY;  Surgeon: ArWellington HampshireMD;  [facility];  Service: Cardiovascular;  Laterality: N/A;   ABDOMINAL HYSTERECTOMY     APPENDECTOMY     BACK SURGERY     CARDIAC CATHETERIZATION N/A 01/01/2016   Procedure: Left Heart Cath and Coronary Angiography;  Surgeon: JaJettie BoozeMD;  [facility];  Service: Cardiovascular;  Laterality: N/A;   CATARACT EXTRACTION W/PHACO Left 01/30/2015   Procedure: CATARACT EXTRACTION PHACO AND INTRAOCULAR LENS PLACEMENT (IOC);  Surgeon: Birder Robson, MD;  Location: [facility];  Service: Ophthalmology;  Laterality: Left;  [place] 00:47    CATARACT EXTRACTION W/PHACO Right 02/13/2015   Procedure: CATARACT EXTRACTION PHACO AND INTRAOCULAR LENS PLACEMENT (IOC);  Surgeon: Birder Robson, MD;  Location: [facility];  Service: Ophthalmology;  Laterality: Right;  cassette lot # [id number] H [place]  00:29.9 AP  20.7 CDE  6.20   COLONOSCOPY N/A 11/02/2014   Procedure: COLONOSCOPY;  Surgeon: Inda Castle, MD;  Location: [place];  Service: Endoscopy;  Laterality: N/A;   CORONARY  ANGIOPLASTY WITH STENT PLACEMENT Left 02/06/2011   Procedure: CORONARY ANGIOPLASTY WITH STENT PLACEMENT; Location: [place]; Surgeon: Katrine Coho, MD   CORONARY ANGIOPLASTY WITH STENT PLACEMENT Left 09/17/2011   Procedure: CORONARY ANGIOPLASTY WITH STENT PLACEMENT; Location: [place]; Surgeon: Katrine Coho, MD   CORONARY ANGIOPLASTY WITH STENT PLACEMENT Left 10/02/2011   Procedure: CORONARY ANGIOPLASTY WITH STENT PLACEMENT; Location: [place]; Surgeon: Katrine Coho, MD   CORONARY ANGIOPLASTY WITH STENT PLACEMENT Left 08/19/2012   Procedure: CORONARY ANGIOPLASTY WITH STENT PLACEMENT; Location: [place]; Surgeon: Kathlyn Sacramento, MD   ESOPHAGOGASTRODUODENOSCOPY (EGD) WITH PROPOFOL N/A 09/21/2018   Procedure: ESOPHAGOGASTRODUODENOSCOPY (EGD) WITH PROPOFOL;  Surgeon: [name], MD;  Location[facility];  Service: Gastroenterology;  Laterality: N/A;   LEFT HEART CATH AND CORONARY ANGIOGRAPHY Left 08/02/2014   Procedure: LEFT HEART CATH AND CORONARY ANGIOGRAPHY; Location: [facility]; Surgeon: Glenetta Hew, MD   PERIPHERAL VASCULAR ATHERECTOMY Left 09/12/2020   Procedure: PERIPHERAL VASCULAR ATHERECTOMY;  Surgeon: Wellington Hampshire, MD;  Location: [place] [facility];  Service: Cardiovascular;  Laterality: Left;   PERIPHERAL VASCULAR BALLOON ANGIOPLASTY  09/12/2020   Procedure: PERIPHERAL VASCULAR BALLOON ANGIOPLASTY;  Surgeon: Wellington Hampshire, MD;  Location: [place] [facility];  Service: Cardiovascular;;   REVERSE SHOULDER ARTHROPLASTY Left 10/01/2021   Procedure: REVERSE SHOULDER ARTHROPLASTY WITH BICEPS TENODESIS.;  Surgeon: Corky Mull, MD;  Location: [facility];  Service: Orthopedics;  Laterality: Left;   SHOULDER SURGERY Left 2017    Current Medications: No outpatient medications have been marked as taking for the 02/17/22 encounter (Appointment) with Rise Mu, PA-C.    Allergies:   Patient has no known allergies.   Social History   Socioeconomic History   Marital status: Married     Spouse name: Not on file   Number of children: Not on file   Years of education: Not on file   Highest education level: Not on file  Occupational History   Not on file  Tobacco Use   Smoking status: Every Day    Packs/day: 1.00    Years: 45.00    Total pack years: 45.00    Types: Cigarettes   Smokeless tobacco: Never   Tobacco comments:    Has cut back, trying to quit.   Vaping Use   Vaping Use: Never used  Substance and Sexual Activity   Alcohol use: No    Alcohol/week: 0.0 standard drinks of alcohol   Drug use: Yes    Types: Marijuana    Comment: last noc   Sexual activity: Not on file  Other Topics Concern   Not on file  Social History Narrative   Lives at home with her husband in [place].  Previously used marijuana - quit.      Regular exercise: no/ pain from a frozen rotator cuff   Caffeine use: coffee daily and pepsi  Does not have a living will.   Daughters and husband know her wishes- would desire CPR but not prolonged life support if futile   Social Determinants of Health   Financial Resource Strain: Low Risk  (02/06/2021)   Overall Financial Resource Strain (CARDIA)    Difficulty of Paying Living Expenses: Not hard at all  Food Insecurity: No Food Insecurity (02/06/2021)   Hunger Vital Sign    Worried About Running Out of Food in the Last Year: Never true    [place] in the Last Year: Never true  Transportation Needs: No Transportation Needs (02/06/2021)   PRAPARE - Hydrologist (Medical): No    Lack of Transportation (Non-Medical): No  Physical Activity: Inactive (02/06/2021)   Exercise Vital Sign    Days of Exercise per Week: 0 days    Minutes of Exercise per Session: 0 min  Stress: No Stress Concern Present (02/06/2021)   [place]    Feeling of Stress : Not at all  Social Connections: Not on file     Family History:  The patient's family  history includes Colon cancer in her sister; Heart attack in her mother; Heart disease in her father and mother; Liver cancer in her brother; Throat cancer in her brother.  ROS:   ROS   EKGs/Labs/Other Studies Reviewed:    Studies reviewed were summarized above. The additional studies were reviewed today:  2D echo 10/03/2021: 1. Challenging images   2. Left ventricular ejection fraction, by estimation, is 55 to 60%. The  left ventricle has normal function. Septal/anteroseptal wall motion  abnormality possibly secondary to LBBB. Left ventricular diastolic  parameters are consistent with Grade I  diastolic dysfunction (impaired relaxation).   3. Right ventricular systolic function is normal. The right ventricular  size is normal. There is normal pulmonary artery systolic pressure. The  estimated right ventricular systolic pressure is 92.4 mmHg.   4. The mitral valve is normal in structure. No evidence of mitral valve  regurgitation. No evidence of mitral stenosis.   5. The aortic valve was not well visualized. Aortic valve regurgitation  is not visualized. No aortic stenosis is present.   6. The inferior vena cava is normal in size with greater than 50%  respiratory variability, suggesting right atrial pressure of 3 mmHg. __________  [place]wyn Reach patch 03/2021: Normal sinus rhythm Patient had a min HR of 53 bpm, max HR of 146 bpm, and avg HR of 80 bpm.    Bundle Branch Block/IVCD was present.  3 Supraventricular Tachycardia runs occurred, the run with the fastest interval lasting 5 beats with a max rate of 146 bpm, the longest lasting 7 beats with an avg rate of 134 bpm.    Isolated SVEs were rare (<1.0%), SVE Couplets were rare (<1.0%), and SVE Triplets were rare (<1.0%).  Isolated VEs were rare (<1.0%), VE Couplets were rare (<1.0%), and no VE Triplets were present. Ventricular Trigeminy was present.   Rare patient triggered events associated with ectopy __________  2D echo 04/11/2022:   1. Left ventricular ejection fraction, by estimation, is 55%. Left  ventricular ejection fraction by 2D MOD biplane is 54.8 %. The left  ventricle has normal function. The left ventricle has no regional wall  motion abnormalities. There is mild left  ventricular hypertrophy. Left ventricular diastolic parameters are  consistent with Grade I diastolic dysfunction (impaired relaxation).   2. Right ventricular systolic function is normal.  The right ventricular  size is normal.   3. The mitral valve was not well visualized. No evidence of mitral valve  regurgitation.   4. The aortic valve is tricuspid. Aortic valve regurgitation is not  visualized. Mild aortic valve sclerosis is present, with no evidence of  aortic valve stenosis.   5. The inferior vena cava is normal in size with greater than 50%  respiratory variability, suggesting right atrial pressure of 3 mmHg. __________  Carotid artery ultrasound 04/11/2021: Summary:  Right Carotid: Velocities in the right ICA are consistent with a 1-39%  stenosis. Non-hemodynamically significant plaque <50% noted in the CCA. The ECA appears 50% stenosed.   Left Carotid: Velocities in the left ICA are consistent with a 1-39%  stenosis. Non-hemodynamically significant plaque <50% noted in the  CCA. The ECA appears <50% stenosed.   Vertebrals:  Bilateral vertebral arteries demonstrate antegrade flow.  Subclavians: Normal flow hemodynamics were seen in bilateral subclavian arteries. ___________  Lower extremity arterial ultrasound 09/17/2020: Left:  Marked improvement is noted compared to previous study.  Atherosclerosis in the common femoral, femoral and popliteal arteries.  No signficiant stenosis in the SFA, s/p atherectomy.   Suggest follow up study in 6 months. __________  ABI 09/17/2020:  +-------+-----------+-----------+------------+------------+  ABI/TBIToday's ABIToday's TBIPrevious ABIPrevious TBI   +-------+-----------+-----------+------------+------------+  Right  1.04       0.80       1.08        0.67          +-------+-----------+-----------+------------+------------+  Left   1.08       0.66       0.63        absent        +-------+-----------+-----------+------------+------------+    Right ABIs appear essentially unchanged compared to prior study on 08/2020.  Left ABIs and TBIs appear increased compared to prior study on 08/2020.     Summary:  Right: Resting right ankle-brachial index is within normal range. No  evidence of significant right lower extremity arterial disease. The right  toe-brachial index is normal.   Left: Resting left ankle-brachial index is within normal range. No  evidence of significant left lower extremity arterial disease. The left  toe-brachial index is abnormal. __________  Abdominal aortogram with lower extremity 09/12/2020: 1.  No significant aortoiliac disease. 2.  Left lower extremity: Severe calcified disease in the distal SFA with two-vessel runoff below the knee with an occluded anterior tibial artery.  The posterior tibial artery has moderate diffuse disease in the mid to distal segment. 3.  Successful orbital atherectomy and drug-coated balloon angioplasty to the left SFA.   Recommendations: Continue dual antiplatelet therapy. Aggressive treatment of risk factors. __________  [facility] 01/01/2016: Patent stents in the circumflex and RCA. There is severe left ventricular systolic dysfunction in a pattern of Takotsubo cardiomyopathy.   Medical therapy.   EKG:  EKG is ordered today.  The EKG ordered today demonstrates ***  Recent Labs: 09/24/2021: ALT 21 10/02/2021: B Natriuretic Peptide 265.6 10/03/2021: BUN 39; Creatinine, Ser 1.64; Hemoglobin 12.1; Magnesium 1.6; Platelets 196; Potassium 4.3; Sodium 135 01/14/2022: TSH 1.92  Recent Lipid Panel    Component Value Date/Time   CHOL 112 07/18/2021 1443   CHOL 200 (H) 08/29/2019  0947   CHOL 292 (H) 08/02/2014 0408   TRIG 182.0 (H) 07/18/2021 1443   TRIG 388 (H) 08/02/2014 0408   HDL 36.40 (L) 07/18/2021 1443   HDL 34 (L) 08/29/2019 0947   HDL 34 (L) 08/02/2014 0408  CHOLHDL 3 07/18/2021 1443   VLDL 36.4 07/18/2021 1443   VLDL 78 (H) 08/02/2014 0408   LDLCALC 39 07/18/2021 1443   LDLCALC 131 (H) 08/29/2019 0947   LDLCALC 180 (H) 08/02/2014 0408   LDLDIRECT 143.0 12/24/2018 1227    PHYSICAL EXAM:    VS:  There were no vitals taken for this visit.  BMI: There is no height or weight on file to calculate BMI.  Physical Exam  Wt Readings from Last 3 Encounters:  01/30/22 143 lb (64.9 kg)  01/14/22 141 lb (64 kg)  10/11/21 140 lb 3.2 oz (63.6 kg)     ASSESSMENT & PLAN:   ***   {Are you ordering a CV Procedure (e.g. stress test, cath, DCCV, TEE, etc)?   Press F2        :[id number]}     Disposition: F/u with Dr. Rockey Situ or an APP in ***.   Medication Adjustments/Labs and Tests Ordered: Current medicines are reviewed at length with the patient today.  Concerns regarding medicines are outlined above. Medication changes, Labs and Tests ordered today are summarized above and listed in the Patient Instructions accessible in Encounters.   Signed, Christell Faith, PA-C 02/13/2022 12:57 PM     [address] [place], [place] [address] [phone]

## 2022-02-17 ENCOUNTER — Ambulatory Visit (INDEPENDENT_AMBULATORY_CARE_PROVIDER_SITE_OTHER): Payer: Medicare PPO | Admitting: Physician Assistant

## 2022-02-17 ENCOUNTER — Encounter: Payer: Self-pay | Admitting: Physician Assistant

## 2022-02-17 VITALS — BP 126/78 | HR 76 | Ht 66.5 in | Wt 142.0 lb

## 2022-02-17 DIAGNOSIS — E785 Hyperlipidemia, unspecified: Secondary | ICD-10-CM

## 2022-02-17 DIAGNOSIS — I2510 Atherosclerotic heart disease of native coronary artery without angina pectoris: Secondary | ICD-10-CM | POA: Diagnosis not present

## 2022-02-17 DIAGNOSIS — R42 Dizziness and giddiness: Secondary | ICD-10-CM

## 2022-02-17 DIAGNOSIS — I10 Essential (primary) hypertension: Secondary | ICD-10-CM

## 2022-02-17 DIAGNOSIS — I5032 Chronic diastolic (congestive) heart failure: Secondary | ICD-10-CM | POA: Diagnosis not present

## 2022-02-17 DIAGNOSIS — I739 Peripheral vascular disease, unspecified: Secondary | ICD-10-CM

## 2022-02-17 DIAGNOSIS — Z720 Tobacco use: Secondary | ICD-10-CM

## 2022-02-17 MED ORDER — CLOPIDOGREL BISULFATE 75 MG PO TABS: 75.0000 mg | ORAL_TABLET | Freq: Every day | ORAL | 3 refills | Status: DC

## 2022-02-17 MED ORDER — CARVEDILOL 3.125 MG PO TABS: 3.1250 mg | ORAL_TABLET | Freq: Two times a day (BID) | ORAL | 3 refills | Status: DC

## 2022-02-17 MED ORDER — FUROSEMIDE 20 MG PO TABS: ORAL_TABLET | ORAL | 3 refills | Status: DC

## 2022-02-17 MED ORDER — POTASSIUM CHLORIDE CRYS ER 10 MEQ PO TBCR: EXTENDED_RELEASE_TABLET | ORAL | 3 refills | Status: DC

## 2022-02-17 MED ORDER — ROSUVASTATIN CALCIUM 40 MG PO TABS: 40.0000 mg | ORAL_TABLET | Freq: Every evening | ORAL | 3 refills | Status: DC

## 2022-02-17 MED ORDER — ENALAPRIL MALEATE 10 MG PO TABS: 10.0000 mg | ORAL_TABLET | Freq: Every day | ORAL | 3 refills | Status: DC

## 2022-02-17 NOTE — Patient Instructions (Signed)
Medication Instructions:   Your physician recommends that you continue on your current medications as directed. Please refer to the Current Medication list given to you today.  *If you need a refill on your cardiac medications before your next appointment, please call your pharmacy*   Lab Work:  None ordered  Testing/Procedures:  1) Your physician has requested that you have a lower extremity arterial exercise duplex. During this test, exercise and ultrasound are used to evaluate arterial blood flow in the legs. Allow one hour for this exam. There are no restrictions or special instructions.  2) Your physician has requested that you have an ankle brachial index (ABI). During this test an ultrasound and blood pressure cuff are used to evaluate the arteries that supply the arms and legs with blood. Allow thirty minutes for this exam. There are no restrictions or special instructions.   Follow-Up: At [facility], you and your health needs are our priority.  As part of our continuing mission to provide you with exceptional heart care, we have created designated Provider Care Teams.  These Care Teams include your primary Cardiologist (physician) and Advanced Practice Providers (APPs -  Physician Assistants and Nurse Practitioners) who all work together to provide you with the care you need, when you need it.  We recommend signing up for the patient portal called "MyChart".  Sign up information is provided on this After Visit Summary.  MyChart is used to connect with patients for Virtual Visits (Telemedicine).  Patients are able to view lab/test results, encounter notes, upcoming appointments, etc.  Non-urgent messages can be sent to your provider as well.   To learn more about what you can do with MyChart, go to [email or web address].    Your next appointment:   6 month(s)  The format for your next appointment:   In Person  Provider:   You may see Ida Rogue, MD or one of the  following Advanced Practice Providers on your designated Care Team:    Christell Faith, PA-C     Important Information About Sugar

## 2022-02-18 ENCOUNTER — Encounter: Payer: Self-pay | Admitting: Primary Care

## 2022-02-18 ENCOUNTER — Ambulatory Visit (INDEPENDENT_AMBULATORY_CARE_PROVIDER_SITE_OTHER): Payer: Medicare PPO | Admitting: Primary Care

## 2022-02-18 VITALS — BP 130/80 | HR 72 | Temp 97.5°F | Ht 65.5 in | Wt 142.0 lb

## 2022-02-18 DIAGNOSIS — E2839 Other primary ovarian failure: Secondary | ICD-10-CM

## 2022-02-18 DIAGNOSIS — R350 Frequency of micturition: Secondary | ICD-10-CM | POA: Diagnosis not present

## 2022-02-18 DIAGNOSIS — G629 Polyneuropathy, unspecified: Secondary | ICD-10-CM | POA: Diagnosis not present

## 2022-02-18 DIAGNOSIS — E039 Hypothyroidism, unspecified: Secondary | ICD-10-CM | POA: Diagnosis not present

## 2022-02-18 DIAGNOSIS — Z720 Tobacco use: Secondary | ICD-10-CM

## 2022-02-18 DIAGNOSIS — Z0001 Encounter for general adult medical examination with abnormal findings: Secondary | ICD-10-CM

## 2022-02-18 DIAGNOSIS — N9489 Other specified conditions associated with female genital organs and menstrual cycle: Secondary | ICD-10-CM

## 2022-02-18 DIAGNOSIS — N949 Unspecified condition associated with female genital organs and menstrual cycle: Secondary | ICD-10-CM | POA: Diagnosis not present

## 2022-02-18 LAB — POC URINALSYSI DIPSTICK (AUTOMATED)
Bilirubin, UA: NEGATIVE
Blood, UA: NEGATIVE
Glucose, UA: NEGATIVE
Ketones, UA: NEGATIVE
Leukocytes, UA: NEGATIVE
Nitrite, UA: NEGATIVE
Protein, UA: NEGATIVE
Spec Grav, UA: 1.01 (ref 1.010–1.025)
Urobilinogen, UA: 0.2 E.U./dL
pH, UA: 6 (ref 5.0–8.0)

## 2022-02-18 LAB — TSH: TSH: 3.46 u[IU]/mL (ref 0.35–5.50)

## 2022-02-18 NOTE — Assessment & Plan Note (Signed)
Controlled according to TSH from late June 2023. Patient endorses she's been taking levothyroxine 100 mcg daily, not 75 mcg. This was switched during her hospitalization in March 2023.  Repeat TSH pending. Continue levothyroxine 100 mcg for now.

## 2022-02-18 NOTE — Patient Instructions (Addendum)
Stop by the lab prior to leaving today. I will notify you of your results once received.   Please call me regarding the name of the cream you have at home.  You will be contacted regarding your referral to neurology.  Please let us know if you have not been contacted within two weeks.   It was a pleasure to see you today!

## 2022-02-18 NOTE — Assessment & Plan Note (Addendum)
Acute symptoms for 2-3 weeks. Could be secondary to lichen sclerosis, vaginal atrophy, or cystitis.  UA today negative. Urine culture pending. Glucose levels improved.  She will call regarding which cream she has at home.

## 2022-02-18 NOTE — Assessment & Plan Note (Signed)
Lung cancer screening UTD, due in October 2023.

## 2022-02-18 NOTE — Assessment & Plan Note (Signed)
Referral placed to neurology per patient request.  Continue gabapentin 400 mg BID.

## 2022-02-18 NOTE — Assessment & Plan Note (Addendum)
Acute.  UA today negative. Culture pending.  Glucose levels are improved. Await results.

## 2022-02-18 NOTE — Assessment & Plan Note (Signed)
Immunizations UTD. Declines colonoscopy and mammogram despite recommendations.  Bone density scan due, she agrees to complete. Orders placed. Lung cancer screening UTD.  Discussed the importance of a healthy diet and regular exercise in order for weight loss, and to reduce the risk of further co-morbidity.  Exam as noted. Labs reviewed.  Follow up in 1 year for repeat physical.

## 2022-02-18 NOTE — Progress Notes (Signed)
Subjective:    Patient ID: Sarah Payne, female    DOB: 03-25-1946, 76 y.o.   MRN: [id number]  HPI  Sarah Payne is a very pleasant 76 y.o. female who presents today for complete physical and follow up of chronic conditions.  She would like to be referred to a neurologist for chronic neuropathy. Chronic to the toes with radiation up to her knees bilaterally. She's now noticed tingling to her hands. She's noticed intermittent blisters to her toes, currently one to the right great toe which began about 1 week ago with swelling, redness and pain. Overall the blister and swelling has improved.   She would also like to discuss urinary frequency, vaginal pain with and without urination. Occasional itching. She endorses a "raw" sensation to her vaginal area for the last 2-3 weeks. She denies hematuria, vaginal discharge. She's been using a vaginal cream at home PRN which has helped with symptoms. She does not recall the name.   Immunizations: -Tetanus: 2014 -Influenza: Due this season  -Covid-19: 3 vaccines -Shingles: Completed Shingrix series  -Pneumonia: Prevnar 13 in 2015, Pneumovax in 2020  Diet: [place].  Exercise: No regular exercise.  Eye exam: Completes annually. Due now Dental exam: Completed years ago.  Mammogram: Completed years ago, declined last year and continues to decline.   Colonoscopy: Completed in 2016, due 2019, patient never completed. Patient referred in 2019, no showed. She declines today.  Lung Cancer Screening: Completed in October 2022 Dexa: Completed in 2019   BP Readings from Last 3 Encounters:  02/18/22 130/80  02/17/22 126/78  01/30/22 (!) 152/87       Review of Systems  Constitutional:  Negative for unexpected weight change.  HENT:  Negative for congestion.   Eyes:  Negative for visual disturbance.  Respiratory:  Negative for shortness of breath.   Cardiovascular:  Negative for chest pain.  Gastrointestinal:  Positive for constipation.  Negative for diarrhea.  Genitourinary:  Negative for hematuria.       Vaginal burning, see HPI  Musculoskeletal:  Positive for arthralgias.  Skin:  Positive for wound.  Neurological:  Positive for dizziness and numbness.  Psychiatric/Behavioral:  The patient is nervous/anxious.          Past Medical History:  Diagnosis Date   Aortic atherosclerosis ([place])    Bilateral cataracts 01/2015   a.) s/p extraction   CHF (congestive heart failure) ([place]) 09/16/2013   a.) TTE 09/16/2013: EF 55-60%; mod inferior HK, triv TR; G1DD. b.) TTE 01/02/2016: EF 25-30%; mid-apicalateroseptal, lat, inf, inferoseptal, apical akinesis; triv TR; G1DD. c. TTE 04/16/2016: EF 65%; G1DD. d.) TTE 05/02/2020: EF 50-55%; G2DD. e.) TTE 04/11/2021: EF 55%; G1DD.   Chronic low back pain    Clostridioides difficile infection 2015   COPD (chronic obstructive pulmonary disease) (H[facility])    Coronary artery disease    a.) PCI 02/06/2011: 100% mLCx - DES x 3. b.) PCI 09/17/2011: 95% dRCA - DES x 1. c.) PCI 10/02/2011: 75% pLCX - DES x 1. d.) PCI 08/19/2012: DES x 2 p-mRCA.  e.) [place] 01/01/16: Takotsubo event 01/01/2016 with patent stents   Depression    Diverticulosis    Frequent PVCs    a. Noted in hospital 12/2015.   GERD (gastroesophageal reflux disease)    History of heart artery stent    a.) TOTAL of 7 stents: a.) 02/06/2011 - overlapping 2.5x12 mm Xience V, 2.5x8 mm Xience V, 2.25x8 mm Xience Nano to mLCx. b.) 09/17/2011 - 2.5x23 Xience V  dRCA. c.) 10/02/2011 - 2.5x15 mm Xience V pLCx. d.) 08/09/2012 - overlapping 3.0x73m mRCA and 3.5x268mpRCA   Hyperlipidemia    Hypertension    Hypothyroidism    Impingement syndrome of left shoulder    LBBB (left bundle branch block)    Long term current use of antithrombotics/antiplatelets    a.) clopidogrel   Marijuana abuse    [facility]   a.) multiple MIs;  5 per her report   OSA (obstructive sleep apnea)    a.) mild; does not require nocturnal PAP therapy    Paroxysmal SVT (supra[place]ricular tachycardia) ([facility]   a.)  Zio patch 05/08/2021: 3 distinct SVT runs; fastest 5 beats at a rate of 146 bpm; longest 7 beats at rate of 134 bpm   T[place] (type 2 diabetes mellitus) [place]   Takotsubo cardiomyopathy    a. 12/2015 - nephew committed suicide 1 week prior, sister died the morning of presentation - initially called a STEMI; cath with patent stents. LVEF 25-30%.   Tendonitis of left rotator cuff    Tobacco abuse    Vascular dementia     Social History   Socioeconomic History   Marital status: Married    Spouse name: Not on file   Number of children: Not on file   Years of education: Not on file   Highest education level: Not on file  Occupational History   Not on file  Tobacco Use   Smoking status: Every Day    Packs/day: 1.00    Years: 45.00    Total pack years: 45.00    Types: Cigarettes   Smokeless tobacco: Never   Tobacco comments:    Has cut back, trying to quit.   Vaping Use   Vaping Use: Never used  Substance and Sexual Activity   Alcohol use: No    Alcohol/week: 0.0 standard drinks of alcohol   Drug use: Yes    Types: Marijuana    Comment: last noc   Sexual activity: Not on file  Other Topics Concern   Not on file  Social History Narrative   [place] Previously used marijuana - quit.      Regular exercise: no/ pain from a frozen rotator cuff   Caffeine use: coffee daily and pepsi      Does not have a living will.   Daughters and husband know her wishes- would desire CPR but not prolonged life support if futile   Social Determinants of Health   Financial Resource Strain: Low Risk  (02/06/2021)   Overall Financial Resource Strain (CARDIA)    Difficulty of Paying Living Expenses: Not hard at all  Food Insecurity: No Food Insecurity (02/06/2021)   Hunger Vital Sign    Worried About Running Out of Food in the Last Year: Never true    [place] the Last Year: Never  true  Transportation Needs: No Transportation Needs (02/06/2021)   PRAPARE - TrHydrologistMedical): No    Lack of Transportation (Non-Medical): No  Physical Activity: Inactive (02/06/2021)   Exercise Vital Sign    Days of Exercise per Week: 0 days    Minutes of Exercise per Session: 0 min  Stress: No Stress Concern Present (02/06/2021)   [place]  Feeling of Stress : Not at all  Social Connections: Not on file  Intimate Partner Violence:  Not At Risk (02/06/2021)   Humiliation, Afraid, Rape, and Kick questionnaire    Fear of Current or Ex-Partner: No    Emotionally Abused: No    Physically Abused: No    Sexually Abused: No    Past Surgical History:  Procedure Laterality Date   ABDOMINAL AORTOGRAM W/LOWER EXTREMITY N/A 09/12/2020   Procedure: ABDOMINAL AORTOGRAM W/LOWER EXTREMITY;  Surgeon: Wellington Hampshire, MD;  Location: [place] [facility];  Service: Cardiovascular;  Laterality: N/A;   ABDOMINAL HYSTERECTOMY     APPENDECTOMY     BACK SURGERY     CARDIAC CATHETERIZATION N/A 01/01/2016   Procedure: Left Heart Cath and Coronary Angiography;  Surgeon: Jettie Booze, MD;  Location: [place] [facility];  Service: Cardiovascular;  Laterality: N/A;   CATARACT EXTRACTION W/PHACO Left 01/30/2015   Procedure: CATARACT EXTRACTION PHACO AND INTRAOCULAR LENS PLACEMENT (IOC);  Surgeon: Birder Robson, MD;  Location: [facility];  Service: Ophthalmology;  Laterality: Left;  [place] 00:47    CATARACT EXTRACTION W/PHACO Right 02/13/2015   Procedure: CATARACT EXTRACTION PHACO AND INTRAOCULAR LENS PLACEMENT (IOC);  Surgeon: Birder Robson, MD;  Location: [facility];  Service: Ophthalmology;  Laterality: Right;  cassette lot # [id number] H [place]  00:29.9 AP  20.7 CDE  6.20   COLONOSCOPY N/A 11/02/2014   Procedure: COLONOSCOPY;  Surgeon: Inda Castle, MD;  Location: [place];  Service: Endoscopy;  Laterality:  N/A;   CORONARY ANGIOPLASTY WITH STENT PLACEMENT Left 02/06/2011   Procedure: CORONARY ANGIOPLASTY WITH STENT PLACEMENT; Location: [place]; Surgeon: Katrine Coho, MD   CORONARY ANGIOPLASTY WITH STENT PLACEMENT Left 09/17/2011   Procedure: CORONARY ANGIOPLASTY WITH STENT PLACEMENT; Location: [place]; Surgeon: Katrine Coho, MD   CORONARY ANGIOPLASTY WITH STENT PLACEMENT Left 10/02/2011   Procedure: CORONARY ANGIOPLASTY WITH STENT PLACEMENT; Location: [place]; Surgeon: Katrine Coho, MD   CORONARY ANGIOPLASTY WITH STENT PLACEMENT Left 08/19/2012   Procedure: CORONARY ANGIOPLASTY WITH STENT PLACEMENT; Location: [place]; Surgeon: Kathlyn Sacramento, MD   ESOPHAGOGASTRODUODENOSCOPY (EGD) WITH PROPOFOL N/A 09/21/2018   Procedure: ESOPHAGOGASTRODUODENOSCOPY (EGD) WITH PROPOFOL;  Surgeon: [name], MD;  [facility]: [facility];  Service: Gastroenterology;  Laterality: N/A;   LEFT HEART CATH AND CORONARY ANGIOGRAPHY Left 08/02/2014   Procedure: LEFT HEART CATH AND CORONARY ANGIOGRAPHY; Location: [facility]; Surgeon: Glenetta Hew, MD   PERIPHERAL VASCULAR ATHERECTOMY Left 09/12/2020   Procedure: PERIPHERAL VASCULAR ATHERECTOMY;  Surgeon: Wellington Hampshire, MD;  Location: [place] [facility];  Service: Cardiovascular;  Laterality: Left;   PERIPHERAL VASCULAR BALLOON ANGIOPLASTY  09/12/2020   Procedure: PERIPHERAL VASCULAR BALLOON ANGIOPLASTY;  Surgeon: Wellington Hampshire, MD;  Location: [place] [facility];  Service: Cardiovascular;;   REVERSE SHOULDER ARTHROPLASTY Left 10/01/2021   Procedure: REVERSE SHOULDER ARTHROPLASTY WITH BICEPS TENODESIS.;  Surgeon: Corky Mull, MD;  Location: [facility];  Service: Orthopedics;  Laterality: Left;   SHOULDER SURGERY Left 2017    Family History  Problem Relation Age of Onset   Heart attack Mother        First MI @ 17 - Died @ 39   Heart disease Mother    Heart disease Father        Died @ 36   Throat cancer Brother    Liver cancer Brother    Colon cancer Sister      No Known Allergies  Current Outpatient Medications on File Prior to Visit  Medication Sig Dispense Refill   albuterol (VENTOLIN HFA) 108 (90 Base) MCG/ACT inhaler Inhale 2 puffs into the lungs every 6 (six) hours  as needed for wheezing or shortness of breath. 8 g 0   amitriptyline (ELAVIL) 50 MG tablet Take 1 tablet (50 mg total) by mouth at bedtime as needed for sleep. 30 tablet 5   carvedilol (COREG) 3.125 MG tablet Take 1 tablet (3.125 mg total) by mouth 2 (two) times daily. 180 tablet 3   clopidogrel (PLAVIX) 75 MG tablet Take 1 tablet (75 mg total) by mouth daily. 90 tablet 3   Continuous Blood Gluc Receiver (FREESTYLE LIBRE 2 READER) DEVI Use with sensor to check blood sugars 6 times daily. 1 each 0   Continuous Blood Gluc Sensor (FREESTYLE LIBRE 2 SENSOR) MISC USE TO CHECK SUGARS 6 TIMES DAILY 6 each 1   enalapril (VASOTEC) 10 MG tablet Take 1 tablet (10 mg total) by mouth daily. 90 tablet 3   ezetimibe (ZETIA) 10 MG tablet Take 1 tablet (10 mg total) by mouth daily. For cholesterol. 90 tablet 3   furosemide (LASIX) 20 MG tablet Take one tab only on Monday, Wednesday, & Fridays 45 tablet 3   gabapentin (NEURONTIN) 400 MG capsule Take 400 mg by mouth 2 (two) times daily.     glucose blood (FREESTYLE LITE) test strip Test up to four times a day as needed. DX 100 each 0   insulin aspart (NOVOLOG FLEXPEN) 100 UNIT/ML FlexPen Inject 20 units into the skin with breakfast, 15 units with lunch, and 20 units with dinner for diabetes. (Patient taking differently: Inject 20 units into the skin with breakfast,15 units with lunch, and 20 units with dinner for diabetes.) 45 mL 0   Insulin Pen Needle (INSUPEN PEN NEEDLES) 32G X 4 MM MISC Use to inject insulin 4 times daily. 250 each 1   levothyroxine (SYNTHROID) 100 MCG tablet Take 100 mcg by mouth daily before breakfast.     mirtazapine (REMERON) 7.5 MG tablet Take 1 tablet (7.5 mg total) by mouth at bedtime. For appetite and sleep. 90 tablet 0    ondansetron (ZOFRAN) 4 MG tablet Take 1 tablet (4 mg total) by mouth every 6 (six) hours as needed for nausea. 30 tablet 0   potassium chloride (KLOR-CON M) 10 MEQ tablet Only take on days you take your lasix. Take 1 tablet (10 mEq total) by mouth for 1 dose on those days you take your lasix '20mg'$  pill. 90 tablet 3   rosuvastatin (CRESTOR) 40 MG tablet Take 1 tablet (40 mg total) by mouth every evening. For cholesterol. 90 tablet 3   traZODone (DESYREL) 100 MG tablet TAKE 1 TABLET BY MOUTH AT BEDTIME AS NEEDED FOR SLEEP 90 tablet 2   No current facility-administered medications on file prior to visit.    BP 130/80   Pulse 72   Temp (!) 97.5 F (36.4 C) (Oral)   Ht 5' 5.5" (1.664 m)   Wt 142 lb (64.4 kg)   SpO2 97%   BMI 23.27 kg/m  Objective:   Physical Exam HENT:     Right Ear: Tympanic membrane and ear canal normal.     Left Ear: Tympanic membrane and ear canal normal.     Nose: Nose normal.  Eyes:     Conjunctiva/sclera: Conjunctivae normal.     Pupils: Pupils are equal, round, and reactive to light.  Neck:     Thyroid: No thyromegaly.  Cardiovascular:     Rate and Rhythm: Normal rate and regular rhythm.     Heart sounds: No murmur heard. Pulmonary:     Effort: Pulmonary effort is normal.  Breath sounds: Normal breath sounds. No rales.  Abdominal:     General: Bowel sounds are normal.     Palpations: Abdomen is soft.     Tenderness: There is no abdominal tenderness.  Musculoskeletal:        General: Normal range of motion.     Cervical back: Neck supple.  Lymphadenopathy:     Cervical: No cervical adenopathy.  Skin:    General: Skin is warm and dry.     Comments: Scabbed over wound to tip of great toe measuring 0.25 cm. No ulceration.   Neurological:     Mental Status: She is alert and oriented to person, place, and time.     Cranial Nerves: No cranial nerve deficit.     Deep Tendon Reflexes: Reflexes are normal and symmetric.  Psychiatric:        Mood and  Affect: Mood normal.           Assessment & Plan:   Problem List Items Addressed This Visit       Endocrine   Hypothyroidism    Controlled according to TSH from late June 2023. Patient endorses she's been taking levothyroxine 100 mcg daily, not 75 mcg. This was switched during her hospitalization in March 2023.  Repeat TSH pending. Continue levothyroxine 100 mcg for now.      Relevant Orders   TSH     Nervous and Auditory   Peripheral polyneuropathy    Referral placed to neurology per patient request.  Continue gabapentin 400 mg BID.      Relevant Orders   Ambulatory referral to Neurology     Other   Tobacco abuse    Lung cancer screening UTD, due in October 2023.      Vaginal burning    Acute symptoms for 2-3 weeks. Could be secondary to lichen sclerosis, vaginal atrophy, or cystitis.  UA today negative. Urine culture pending. Glucose levels improved.  She will call regarding which cream she has at home.      Encounter for annual general medical examination with abnormal findings in adult - Primary    Immunizations UTD. Declines colonoscopy and mammogram despite recommendations.  Bone density scan due, she agrees to complete. Orders placed. Lung cancer screening UTD.  Discussed the importance of a healthy diet and regular exercise in order for weight loss, and to reduce the risk of further co-morbidity.  Exam as noted. Labs reviewed.  Follow up in 1 year for repeat physical.       Urinary frequency    Acute.  UA today negative. Culture pending.  Glucose levels are improved. Await results.       Relevant Orders   POCT Urinalysis Dipstick (Automated)   Urine Culture   Other Visit Diagnoses     Estrogen deficiency       Relevant Orders   DG Bone Density          [name], NP

## 2022-02-19 ENCOUNTER — Encounter: Payer: Self-pay | Admitting: *Deleted

## 2022-02-19 LAB — URINE CULTURE
MICRO NUMBER:: 13720695
SPECIMEN QUALITY:: ADEQUATE

## 2022-03-19 ENCOUNTER — Ambulatory Visit: Payer: Medicare PPO

## 2022-03-28 ENCOUNTER — Other Ambulatory Visit: Payer: Medicare PPO

## 2022-04-16 ENCOUNTER — Encounter: Payer: Self-pay | Admitting: Primary Care

## 2022-04-16 ENCOUNTER — Ambulatory Visit (INDEPENDENT_AMBULATORY_CARE_PROVIDER_SITE_OTHER): Payer: Medicare PPO | Admitting: Primary Care

## 2022-04-16 VITALS — BP 128/84 | HR 65 | Temp 97.3°F | Ht 65.5 in | Wt 141.0 lb

## 2022-04-16 DIAGNOSIS — E1151 Type 2 diabetes mellitus with diabetic peripheral angiopathy without gangrene: Secondary | ICD-10-CM | POA: Diagnosis not present

## 2022-04-16 DIAGNOSIS — N949 Unspecified condition associated with female genital organs and menstrual cycle: Secondary | ICD-10-CM | POA: Diagnosis not present

## 2022-04-16 DIAGNOSIS — Z23 Encounter for immunization: Secondary | ICD-10-CM | POA: Diagnosis not present

## 2022-04-16 LAB — POC URINALSYSI DIPSTICK (AUTOMATED)
Bilirubin, UA: NEGATIVE
Blood, UA: NEGATIVE
Glucose, UA: POSITIVE — AB
Ketones, UA: NEGATIVE
Leukocytes, UA: NEGATIVE
Nitrite, UA: NEGATIVE
Protein, UA: NEGATIVE
Spec Grav, UA: 1.015 (ref 1.010–1.025)
Urobilinogen, UA: 0.2 E.U./dL
pH, UA: 6 (ref 5.0–8.0)

## 2022-04-16 LAB — POCT GLYCOSYLATED HEMOGLOBIN (HGB A1C): Hemoglobin A1C: 8.7 % — AB (ref 4.0–5.6)

## 2022-04-16 MED ORDER — NOVOLOG FLEXPEN 100 UNIT/ML ~~LOC~~ SOPN: PEN_INJECTOR | SUBCUTANEOUS | 0 refills | Status: DC

## 2022-04-16 MED ORDER — CLOBETASOL PROPIONATE 0.05 % EX CREA: 1.0000 | TOPICAL_CREAM | Freq: Two times a day (BID) | CUTANEOUS | 0 refills | Status: DC | PRN

## 2022-04-16 NOTE — Assessment & Plan Note (Signed)
Acute on chronic.  UA today with trace glucose, otherwise negative .  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  Recommend wet prep if no improvement. She declines pelvic exam today.  She will update.

## 2022-04-16 NOTE — Patient Instructions (Signed)
Reduce your Novolog to 10 units in the morning. Continue Novolog 18 units in the afternoon and Novolog 16 units with dinner.  Resume Lantus 8 units at bedtime.  Please call me with an update on your blood sugars in 2 weeks.  Please schedule a follow up visit for 3 months.  It was a pleasure to see you today!

## 2022-04-16 NOTE — Assessment & Plan Note (Signed)
Uncontrolled with A1C of 8.7.  Need to work to prevent hypoglycemia.   Reduce Novolog to 10 units in AM, continue Novolog 18 units in afternoon, continue Novolog 16 units with dinner. Add Lantus 8 units HS.  Discussed to contact us in 2 weeks with glucose reading updates.  Follow up in 3 months.

## 2022-04-16 NOTE — Progress Notes (Signed)
Subjective:    Patient ID: Sarah Payne, female    DOB: November 04, 1945, 76 y.o.   MRN: [id number]  HPI  Sarah Payne is a very pleasant 76 y.o. female with a significant medical history including COPD, tobacco abuse, type 2 diabetes, OSA, CAD, CHF who presents today for follow up of diabetes.  1) Type 2 Diabetes: Current medications include: Novolog 20 units with breakfast, 15 units for lunch, 20 units with dinner.  She decreased her Novolog to 18 units in am and afternoon x 3 weeks. She has reduced to 16 units with dinner.   She is checking her blood glucose continuously:  AM fasting: mid to high 100's. She only drinks coffee, does not eat breakfast Before lunch: 40's to 50's Before dinner: 120's    Last A1C: 7.8 in June 2023, 8.7 today Last Eye Exam: UTD Last Foot Exam: UTD Pneumonia Vaccination: 2020 Urine Microalbumin: Due Statin: rosuvastatin   Dietary changes since last visit: She is drinking Pepsi several times weekly. She denies consumption of fast food, fried food. She eats mostly mean and veggies for dinner, sandwich for lunch.    Exercise: No regular   2) Vaginal Burning: Acute on chronic, increased over the last month. Burning occurs to the skin of the labia minora and majora  just after urination. She denies hematuria, vaginal discharge, rash, dysuria. She's been applying an OTC vaginal cream with temporary improvement. She is out of her clobetasol cream which helped historically.     Review of Systems  Cardiovascular:  Negative for chest pain.  Genitourinary:  Negative for dysuria, frequency, urgency, vaginal bleeding and vaginal discharge.       Vaginal skin burning  Neurological:  Positive for numbness.         Past Medical History:  Diagnosis Date   Aortic atherosclerosis ([place])    Bilateral cataracts 01/2015   a.) s/p extraction   CHF (congestive heart failure) ([place]) 09/16/2013   a.) TTE 09/16/2013: EF 55-60%; mod inferior HK, triv TR; G1DD. b.) TTE  01/02/2016: EF 25-30%; mid-apicalateroseptal, lat, inf, inferoseptal, apical akinesis; triv TR; G1DD. c. TTE 04/16/2016: EF 65%; G1DD. d.) TTE 05/02/2020: EF 50-55%; G2DD. e.) TTE 04/11/2021: EF 55%; G1DD.   Chronic low back pain    Clostridioides difficile infection 2015   COPD (chronic obstructive pulmonary disease) (HCC)    Coronary artery disease    a.) PCI 02/06/2011: 100% mLCx - DES x 3. b.) PCI 09/17/2011: 95% dRCA - DES x 1. c.) PCI 10/02/2011: 75% pLCX - DES x 1. d.) PCI 08/19/2012: DES x 2 p-mRCA.  e.) [place] 01/01/16: Takotsubo event 01/01/2016 with patent stents   Depression    Diverticulosis    Frequent PVCs    a. Noted in hospital 12/2015.   GERD (gastroesophageal reflux disease)    History of heart artery stent    a.) TOTAL of 7 stents: a.) 02/06/2011 - overlapping 2.5x12 mm Xience V, 2.5x8 mm Xience V, 2.25x8 mm Xience Nano to mLCx. b.) 09/17/2011 - 2.5x23 Xience V dRCA. c.) 10/02/2011 - 2.5x15 mm Xience V pLCx. d.) 08/09/2012 - overlapping 3.0x27m mRCA and 3.5x267mpRCA   Hyperlipidemia    Hypertension    Hypothyroidism    Impingement syndrome of left shoulder    LBBB (left bundle branch block)    Long term current use of antithrombotics/antiplatelets    a.) clopidogrel   Marijuana abuse    [facility]   a.) multiple MIs;  5 per her  report   OSA (obstructive sleep apnea)    a.) mild; does not require nocturnal PAP therapy   Paroxysmal SVT (supraventricular tachycardia) ([place]) 05/08/2021   a.)  Zio patch 05/08/2021: 3 distinct SVT runs; fastest 5 beats at a rate of 146 bpm; longest 7 beats at rate of 134 bpm   T2DM (type 2 diabetes mellitus) ([place])    Takotsubo cardiomyopathy    a. 12/2015 - nephew committed suicide 1 week prior, sister died the morning of presentation - initially called a STEMI; cath with patent stents. LVEF 25-30%.   Tendonitis of left rotator cuff    Tobacco abuse    Vascular dementia     Social History   Socioeconomic History    Marital status: Married    Spouse name: Not on file   Number of children: Not on file   Years of education: Not on file   Highest education level: Not on file  Occupational History   Not on file  Tobacco Use   Smoking status: Every Day    Packs/day: 1.00    Years: 45.00    Total pack years: 45.00    Types: Cigarettes   Smokeless tobacco: Never   Tobacco comments:    Has cut back, trying to quit.   Vaping Use   Vaping Use: Never used  Substance and Sexual Activity   Alcohol use: No    Alcohol/week: 0.0 standard drinks of alcohol   Drug use: Yes    Types: Marijuana    Comment: last noc   Sexual activity: Not on file  Other Topics Concern   Not on file  Social History Narrative   Lives at home with her husband in [place].  Previously used marijuana - quit.      Regular exercise: no/ pain from a frozen rotator cuff   Caffeine use: coffee daily and pepsi      Does not have a living will.   Daughters and husband know her wishes- would desire CPR but not prolonged life support if futile   Social Determinants of Health   Financial Resource Strain: Low Risk  (02/06/2021)   Overall Financial Resource Strain (CARDIA)    Difficulty of Paying Living Expenses: Not hard at all  Food Insecurity: No Food Insecurity (02/06/2021)   Hunger Vital Sign    Worried About Running Out of Food in the Last Year: Never true    [place] in the Last Year: Never true  Transportation Needs: No Transportation Needs (02/06/2021)   PRAPARE - Hydrologist (Medical): No    Lack of Transportation (Non-Medical): No  Physical Activity: Inactive (02/06/2021)   Exercise Vital Sign    Days of Exercise per Week: 0 days    Minutes of Exercise per Session: 0 min  Stress: No Stress Concern Present (02/06/2021)   [place]    Feeling of Stress : Not at all  Social Connections: Not on file  Intimate Partner  Violence: Not At Risk (02/06/2021)   Humiliation, Afraid, Rape, and Kick questionnaire    Fear of Current or Ex-Partner: No    Emotionally Abused: No    Physically Abused: No    Sexually Abused: No    Past Surgical History:  Procedure Laterality Date   ABDOMINAL AORTOGRAM W/LOWER EXTREMITY N/A 09/12/2020   Procedure: ABDOMINAL AORTOGRAM W/LOWER EXTREMITY;  Surgeon: Wellington [place], MD;  Location: [facility];  Service: Cardiovascular;  Laterality: N/A;   ABDOMINAL HYSTERECTOMY     APPENDECTOMY     BACK SURGERY     CARDIAC CATHETERIZATION N/A 01/01/2016   Procedure: Left Heart Cath and Coronary Angiography;  Surgeon: Jettie Booze, MD;  Location: [place];  Service: Cardiovascular;  Laterality: N/A;   CATARACT EXTRACTION W/PHACO Left 01/30/2015   Procedure: CATARACT EXTRACTION PHACO AND INTRAOCULAR LENS PLACEMENT (IOC);  Surgeon: Birder Robson, MD;  Location: [facility];  Service: Ophthalmology;  Laterality: Left;  [place] 00:47    CATARACT EXTRACTION W/PHACO Right 02/13/2015   Procedure: CATARACT EXTRACTION PHACO AND INTRAOCULAR LENS PLACEMENT (IOC);  Surgeon: Birder Robson, MD;  Location: [facility];  Service: Ophthalmology;  Laterality: Right;  cassette lot # [id number] H [place]  00:29.9 AP  20.7 CDE  6.20   COLONOSCOPY N/A 11/02/2014   Procedure: COLONOSCOPY;  Surgeon: Inda Castle, MD;  Location: [facility];  Service: Endoscopy;  Laterality: N/A;   CORONARY ANGIOPLASTY WITH STENT PLACEMENT Left 02/06/2011   Procedure: CORONARY ANGIOPLASTY WITH STENT PLACEMENT; Location: [facility]; Surgeon: Katrine Coho, MD   CORONARY ANGIOPLASTY WITH STENT PLACEMENT Left 09/17/2011   Procedure: CORONARY ANGIOPLASTY WITH STENT PLACEMENT; Location: [place]; Surgeon: Katrine Coho, MD   CORONARY ANGIOPLASTY WITH STENT PLACEMENT Left 10/02/2011   Procedure: CORONARY ANGIOPLASTY WITH STENT PLACEMENT; Location: [facility]; Surgeon: Katrine Coho, MD   CORONARY ANGIOPLASTY WITH STENT PLACEMENT  Left 08/19/2012   Procedure: CORONARY ANGIOPLASTY WITH STENT PLACEMENT; Location: [place]; Surgeon: Kathlyn Sacramento, MD   ESOPHAGOGASTRODUODENOSCOPY (EGD) WITH PROPOFOL N/A 09/21/2018   Procedure: ESOPHAGOGASTRODUODENOSCOPY (EGD) WITH PROPOFOL;  Surgeon: [name], MD;  [facility];  Service: Gastroenterology;  Laterality: N/A;   LEFT HEART CATH AND CORONARY ANGIOGRAPHY Left 08/02/2014   Procedure: LEFT HEART CATH AND CORONARY ANGIOGRAPHY; Location: [facility]; Surgeon: Glenetta Hew, MD   PERIPHERAL VASCULAR ATHERECTOMY Left 09/12/2020   Procedure: PERIPHERAL VASCULAR ATHERECTOMY;  Surgeon: Wellington [place], MD;  Location: [place] [facility];  Service: Cardiovascular;  Laterality: Left;   PERIPHERAL VASCULAR BALLOON ANGIOPLASTY  09/12/2020   Procedure: PERIPHERAL VASCULAR BALLOON ANGIOPLASTY;  Surgeon: Wellington [place], MD;  Location: [place] [facility];  Service: Cardiovascular;;   REVERSE SHOULDER ARTHROPLASTY Left 10/01/2021   Procedure: REVERSE SHOULDER ARTHROPLASTY WITH BICEPS TENODESIS.;  Surgeon: Corky Mull, MD;  Location: [facility];  Service: Orthopedics;  Laterality: Left;   SHOULDER SURGERY Left 2017    Family History  Problem Relation Age of Onset   Heart attack Mother        First MI @ 21 - Died @ 56   Heart disease Mother    Heart disease Father        Died @ 52   Throat cancer Brother    Liver cancer Brother    Colon cancer Sister     No Known Allergies  Current Outpatient Medications on File Prior to Visit  Medication Sig Dispense Refill   albuterol (VENTOLIN HFA) 108 (90 Base) MCG/ACT inhaler Inhale 2 puffs into the lungs every 6 (six) hours as needed for wheezing or shortness of breath. 8 g 0   carvedilol (COREG) 3.125 MG tablet Take 1 tablet (3.125 mg total) by mouth 2 (two) times daily. 180 tablet 3   clopidogrel (PLAVIX) 75 MG tablet Take 1 tablet (75 mg total) by mouth daily. 90 tablet 3   Continuous Blood Gluc Receiver (FREESTYLE LIBRE 2 READER)  DEVI Use with sensor to check blood sugars 6 times daily. 1 each 0   Continuous Blood  Gluc Sensor (FREESTYLE LIBRE 2 SENSOR) MISC USE TO CHECK SUGARS 6 TIMES DAILY 6 each 1   enalapril (VASOTEC) 10 MG tablet Take 1 tablet (10 mg total) by mouth daily. 90 tablet 3   ezetimibe (ZETIA) 10 MG tablet Take 1 tablet (10 mg total) by mouth daily. For cholesterol. 90 tablet 3   furosemide (LASIX) 20 MG tablet Take one tab only on Monday, Wednesday, & Fridays 45 tablet 3   gabapentin (NEURONTIN) 400 MG capsule Take 400 mg by mouth 2 (two) times daily.     glucose blood (FREESTYLE LITE) test strip Test up to four times a day as needed. DX 100 each 0   insulin aspart (NOVOLOG FLEXPEN) 100 UNIT/ML FlexPen Inject 20 units into the skin with breakfast, 15 units with lunch, and 20 units with dinner for diabetes. (Patient taking differently: Inject 20 units into the skin with breakfast,15 units with lunch, and 20 units with dinner for diabetes.) 45 mL 0   insulin glargine (LANTUS) 100 UNIT/ML Solostar Pen Inject 10 Units into the skin at bedtime.     Insulin Pen Needle (INSUPEN PEN NEEDLES) 32G X 4 MM MISC Use to inject insulin 4 times daily. 250 each 1   levothyroxine (SYNTHROID) 100 MCG tablet Take 100 mcg by mouth daily before breakfast.     mirtazapine (REMERON) 7.5 MG tablet Take 1 tablet (7.5 mg total) by mouth at bedtime. For appetite and sleep. 90 tablet 0   ondansetron (ZOFRAN) 4 MG tablet Take 1 tablet (4 mg total) by mouth every 6 (six) hours as needed for nausea. 30 tablet 0   potassium chloride (KLOR-CON M) 10 MEQ tablet Only take on days you take your lasix. Take 1 tablet (10 mEq total) by mouth for 1 dose on those days you take your lasix '20mg'$  pill. 90 tablet 3   rosuvastatin (CRESTOR) 40 MG tablet Take 1 tablet (40 mg total) by mouth every evening. For cholesterol. 90 tablet 3   traZODone (DESYREL) 100 MG tablet TAKE 1 TABLET BY MOUTH AT BEDTIME AS NEEDED FOR SLEEP 90 tablet 2   amitriptyline (ELAVIL)  50 MG tablet Take 1 tablet (50 mg total) by mouth at bedtime as needed for sleep. 30 tablet 5   No current facility-administered medications on file prior to visit.    BP 128/84   Pulse 65   Temp (!) 97.3 F (36.3 C) (Temporal)   Ht 5' 5.5" (1.664 m)   Wt 141 lb (64 kg)   SpO2 99%   BMI 23.11 kg/m  Objective:   Physical Exam Cardiovascular:     Rate and Rhythm: Normal rate and regular rhythm.  Pulmonary:     Effort: Pulmonary effort is normal.     Breath sounds: Normal breath sounds.  Musculoskeletal:     Cervical back: Neck supple.  Skin:    General: Skin is warm and dry.           Assessment & Plan:   Problem List Items Addressed This Visit       Cardiovascular and Mediastinum   Diabetes mellitus type 2 with peripheral artery disease ([place]) - Primary (Chronic)    Uncontrolled with A1C of 8.7.  Need to work to prevent hypoglycemia.   Reduce Novolog to 10 units in AM, continue Novolog 18 units in afternoon, continue Novolog 16 units with dinner. Add Lantus 8 units HS.  Discussed to contact us in 2 weeks with glucose reading updates.  Follow up in 3 months.  Relevant Medications   insulin glargine (LANTUS) 100 UNIT/ML Solostar Pen   Other Relevant Orders   POCT glycosylated hemoglobin (Hb A1C)     Other   Vaginal burning    Acute on chronic.  UA today with trace glucose, otherwise negative .  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  Recommend wet prep if no improvement. She declines pelvic exam today.  She will update.       Relevant Medications   clobetasol cream (TEMOVATE) 0.05 %   Other Relevant Orders   POCT Urinalysis Dipstick (Automated)       [name], NP

## 2022-05-16 ENCOUNTER — Telehealth: Payer: Self-pay | Admitting: Primary Care

## 2022-05-16 DIAGNOSIS — E1151 Type 2 diabetes mellitus with diabetic peripheral angiopathy without gangrene: Secondary | ICD-10-CM

## 2022-06-09 MED ORDER — NOVOLOG FLEXPEN 100 UNIT/ML ~~LOC~~ SOPN: PEN_INJECTOR | SUBCUTANEOUS | 0 refills | Status: DC

## 2022-06-09 NOTE — Telephone Encounter (Signed)
  Encourage patient to contact the pharmacy for refills or they can request refills through [place]:  Please schedule appointment if longer than 1 year  NEXT APPOINTMENT DATE:  MEDICATION:insulin aspart (NOVOLOG FLEXPEN) 100 UNIT/ML FlexPen   Is the patient out of medication?   PHARMACY: [facility] [address], [place] - [id number]    Let patient know to contact pharmacy at the end of the day to make sure medication is ready.  Please notify patient to allow 48-72 hours to process  CLINICAL FILLS OUT ALL BELOW:   LAST REFILL:  QTY:  REFILL DATE:    OTHER COMMENTS:    Okay for refill?  Please advise

## 2022-06-24 ENCOUNTER — Ambulatory Visit: Payer: Medicare PPO | Attending: Physician Assistant

## 2022-07-16 ENCOUNTER — Ambulatory Visit: Payer: Medicare PPO | Admitting: Primary Care

## 2022-07-24 ENCOUNTER — Encounter: Payer: Medicare PPO | Admitting: Student in an Organized Health Care Education/Training Program

## 2022-07-31 ENCOUNTER — Ambulatory Visit (INDEPENDENT_AMBULATORY_CARE_PROVIDER_SITE_OTHER): Payer: Medicare PPO | Admitting: Primary Care

## 2022-07-31 ENCOUNTER — Encounter: Payer: Self-pay | Admitting: Primary Care

## 2022-07-31 VITALS — BP 128/82 | HR 67 | Temp 98.4°F | Ht 65.5 in | Wt 138.0 lb

## 2022-07-31 DIAGNOSIS — E1151 Type 2 diabetes mellitus with diabetic peripheral angiopathy without gangrene: Secondary | ICD-10-CM

## 2022-07-31 DIAGNOSIS — F330 Major depressive disorder, recurrent, mild: Secondary | ICD-10-CM | POA: Diagnosis not present

## 2022-07-31 LAB — POCT GLYCOSYLATED HEMOGLOBIN (HGB A1C): Hemoglobin A1C: 8.4 % — AB (ref 4.0–5.6)

## 2022-07-31 LAB — MICROALBUMIN / CREATININE URINE RATIO
Creatinine,U: 203.7 mg/dL
Microalb Creat Ratio: 12.2 mg/g (ref 0.0–30.0)
Microalb, Ur: 24.9 mg/dL — ABNORMAL HIGH (ref 0.0–1.9)

## 2022-07-31 MED ORDER — NOVOLOG FLEXPEN 100 UNIT/ML ~~LOC~~ SOPN: PEN_INJECTOR | SUBCUTANEOUS | 0 refills | Status: DC

## 2022-07-31 NOTE — Patient Instructions (Signed)
Stop taking Glipizide for now for diabetes.  Take your insulin as mentioned below:  Novolog 18 units with breakfast Novolog 14 units with lunch Novolog 16 units with dinner.  Resume mirtazipine (Remeron) 7.5 mg capsules for depression, sleep, and appetite.   Please schedule a follow up visit for 3 months.  It was a pleasure to see you today!

## 2022-07-31 NOTE — Progress Notes (Signed)
Subjective:    Patient ID: Sarah Payne, female    DOB: 06/12/1946, 77 y.o.   MRN: [id number]  HPI  Sarah Payne is a very pleasant 77 y.o. female  has a past medical history of Aortic atherosclerosis ([place]), Bilateral cataracts (01/2015), CHF (congestive heart failure) ([place]) (09/16/2013), Chronic low back pain, Clostridioides difficile infection (2015), COPD (chronic obstructive pulmonary disease) ([place]), Coronary artery disease, Depression, Diverticulosis, Frequent PVCs, GERD (gastroesophageal reflux disease), History of heart artery stent, Hyperlipidemia, Hypertension, Hypothyroidism, Impingement syndrome of left shoulder, LBBB (left bundle branch block), Long term current use of antithrombotics/antiplatelets, Marijuana abuse, Myocardial infarction ([place]), OSA (obstructive sleep apnea), Paroxysmal SVT (supraventricular tachycardia) (05/08/2021), T2DM (type 2 diabetes mellitus) ([place]), Takotsubo cardiomyopathy, Tendonitis of left rotator cuff, Tobacco abuse, and Vascular dementia. who presents today for follow up of diabetes.  Current medications include: Novolog 10 units in AM, 18 units in afternoon, 16 units HS.  She is injecting Novolog 18 units in AM before breakfast, 16 units after lunch, 18 units with dinner.   She has been taking Glipizide, she is not sure of the dose, we do not have this on her medication list.   She stopped using Lantus a few months ago.   She is checking her blood glucose continuously daily and is getting readings of  AM fasting: 170's Before Lunch: low 100's Before Dinner: 110's-120's.   She is dropping below 60 once daily, typically in between lunch and dinner. She is struggling with eating in general as "food is not appealing to me". She's been "extremely depressed since christmas" as her daughter stopped speaking with her. She is not waiting 2 hours before checking her glucose levels after a meal.   She has not taken her mirtazapine.   Recently she's waking  during the night with glucose levels in 50's.   Last A1C: 8.7 in December 2023, 8.4  Last Eye Exam: UTD Last Foot Exam: UTD Pneumonia Vaccination: 2020 Urine Microalbumin: Due Statin: None. Intolerant.   Dietary changes since last visit: Decreased appetite.    Exercise: None  BP Readings from Last 3 Encounters:  07/31/22 128/82  04/16/22 128/84  02/18/22 130/80       Review of Systems  Respiratory:  Negative for shortness of breath.   Cardiovascular:  Negative for chest pain.  Neurological:  Positive for dizziness and numbness.  Psychiatric/Behavioral:         Depression         Past Medical History:  Diagnosis Date   Aortic atherosclerosis ([place])    Bilateral cataracts 01/2015   a.) s/p extraction   CHF (congestive heart failure) ([place]) 09/16/2013   a.) TTE 09/16/2013: EF 55-60%; mod inferior HK, triv TR; G1DD. b.) TTE 01/02/2016: EF 25-30%; mid-apicalateroseptal, lat, inf, inferoseptal, apical akinesis; triv TR; G1DD. c. TTE 04/16/2016: EF 65%; G1DD. d.) TTE 05/02/2020: EF 50-55%; G2DD. e.) TTE 04/11/2021: EF 55%; G1DD.   Chronic low back pain    Clostridioides difficile infection 2015   COPD (chronic obstructive pulmonary disease) (H[facility])    Coronary artery disease    a.) PCI 02/06/2011: 100% mLCx - DES x 3. b.) PCI 09/17/2011: 95% dRCA - DES x 1. c.) PCI 10/02/2011: 75% pLCX - DES x 1. d.) PCI 08/19/2012: DES x 2 p-mRCA.  e.) [place] 01/01/16: Takotsubo event 01/01/2016 with patent stents   Depression    Diverticulosis    Frequent PVCs    a. Noted in hospital 12/2015.   GERD (gastroesophageal reflux disease)  History of heart artery stent    a.) TOTAL of 7 stents: a.) 02/06/2011 - overlapping 2.5x12 mm Xience V, 2.5x8 mm Xience V, 2.25x8 mm Xience Nano to mLCx. b.) 09/17/2011 - 2.5x23 Xience V dRCA. c.) 10/02/2011 - 2.5x15 mm Xience V pLCx. d.) 08/09/2012 - overlapping 3.0x23m mRCA and 3.5x270mpRCA   Hyperlipidemia    Hypertension    Hypothyroidism    Impingement  syndrome of left shoulder    LBBB (left bundle branch block)    Long term current use of antithrombotics/antiplatelets    a.) clopidogrel   Marijuana abuse    [facility]   a.) multiple MIs;  5 per her report   OSA (obstructive sleep apnea)    a.) mild; does not require nocturnal PAP therapy   Paroxysmal SVT (supraventricular tachycardia) 05/08/2021   a.)  Zio patch 05/08/2021: 3 distinct SVT runs; fastest 5 beats at a rate of 146 bpm; longest 7 beats at rate of 134 bpm   T2DM (type 2 diabetes mellitus) ([place]   Takotsubo cardiomyopathy    a. 12/2015 - nephew committed suicide 1 week prior, sister died the morning of presentation - initially called a STEMI; cath with patent stents. LVEF 25-30%.   Tendonitis of left rotator cuff    Tobacco abuse    Vascular dementia     Social History   Socioeconomic History   Marital status: Married    Spouse name: Not on file   Number of children: Not on file   Years of education: Not on file   Highest education level: Not on file  Occupational History   Not on file  Tobacco Use   Smoking status: Every Day    Packs/day: 1.00    Years: 45.00    Total pack years: 45.00    Types: Cigarettes   Smokeless tobacco: Never   Tobacco comments:    Has cut back, trying to quit.   Vaping Use   Vaping Use: Never used  Substance and Sexual Activity   Alcohol use: No    Alcohol/week: 0.0 standard drinks of alcohol   Drug use: Yes    Types: Marijuana    Comment: last noc   Sexual activity: Not on file  Other Topics Concern   Not on file  Social History Narrative   [place] Previously used marijuana - quit.      Regular exercise: no/ pain from a frozen rotator cuff   Caffeine use: coffee daily and pepsi      Does not have a living will.   Daughters and husband know her wishes- would desire CPR but not prolonged life support if futile   Social Determinants of Health   Financial Resource  Strain: Low Risk  (02/06/2021)   Overall Financial Resource Strain (CARDIA)    Difficulty of Paying Living Expenses: Not hard at all  Food Insecurity: No Food Insecurity (02/06/2021)   Hunger Vital Sign    Worried About Running Out of Food in the Last Year: Never true    [place] the Last Year: Never true  Transportation Needs: No Transportation Needs (02/06/2021)   PRAPARE - TrHydrologistMedical): No    Lack of Transportation (Non-Medical): No  Physical Activity: Inactive (02/06/2021)   Exercise Vital Sign    Days of Exercise per Week: 0 days    Minutes of Exercise per Session: 0 min  Stress:  No Stress Concern Present (02/06/2021)   [place]    Feeling of Stress : Not at all  Social Connections: Not on file  Intimate Partner Violence: Not At Risk (02/06/2021)   Humiliation, Afraid, Rape, and Kick questionnaire    Fear of Current or Ex-Partner: No    Emotionally Abused: No    Physically Abused: No    Sexually Abused: No    Past Surgical History:  Procedure Laterality Date   ABDOMINAL AORTOGRAM W/LOWER EXTREMITY N/A 09/12/2020   Procedure: ABDOMINAL AORTOGRAM W/LOWER EXTREMITY;  Surgeon: Wellington Hampshire, MD;  Location: [facility];  Service: Cardiovascular;  Laterality: N/A;   ABDOMINAL HYSTERECTOMY     APPENDECTOMY     BACK SURGERY     CARDIAC CATHETERIZATION N/A 01/01/2016   Procedure: Left Heart Cath and Coronary Angiography;  Surgeon: Jettie Booze, MD;  Location: [facility];  Service: Cardiovascular;  Laterality: N/A;   CATARACT EXTRACTION W/PHACO Left 01/30/2015   Procedure: CATARACT EXTRACTION PHACO AND INTRAOCULAR LENS PLACEMENT (IOC);  Surgeon: Birder Robson, MD;  Location: [facility];  Service: Ophthalmology;  Laterality: Left;  [place] 00:47    CATARACT EXTRACTION W/PHACO Right 02/13/2015   Procedure: CATARACT EXTRACTION PHACO AND INTRAOCULAR LENS  PLACEMENT (IOC);  Surgeon: Birder Robson, MD;  Location: [facility];  Service: Ophthalmology;  Laterality: Right;  cassette lot # [id number] H [place]  00:29.9 AP  20.7 CDE  6.20   COLONOSCOPY N/A 11/02/2014   Procedure: COLONOSCOPY;  Surgeon: Inda Castle, MD;  Location: [facility];  Service: Endoscopy;  Laterality: N/A;   CORONARY ANGIOPLASTY WITH STENT PLACEMENT Left 02/06/2011   Procedure: CORONARY ANGIOPLASTY WITH STENT PLACEMENT; Location: [facility]; Surgeon: Katrine Coho, MD   CORONARY ANGIOPLASTY WITH STENT PLACEMENT Left 09/17/2011   Procedure: CORONARY ANGIOPLASTY WITH STENT PLACEMENT; Location: [facility]; Surgeon: Katrine Coho, MD   CORONARY ANGIOPLASTY WITH STENT PLACEMENT Left 10/02/2011   Procedure: CORONARY ANGIOPLASTY WITH STENT PLACEMENT; Location: [facility]; Surgeon: Katrine Coho, MD   CORONARY ANGIOPLASTY WITH STENT PLACEMENT Left 08/19/2012   Procedure: CORONARY ANGIOPLASTY WITH STENT PLACEMENT; Location: [facility]; Surgeon: Kathlyn Sacramento, MD   ESOPHAGOGASTRODUODENOSCOPY (EGD) WITH PROPOFOL N/A 09/21/2018   Procedure: ESOPHAGOGASTRODUODENOSCOPY (EGD) WITH PROPOFOL;  Surgeon: [name], MD;  [facility];  Service: Gastroenterology;  Laterality: N/A;   LEFT HEART CATH AND CORONARY ANGIOGRAPHY Left 08/02/2014   Procedure: LEFT HEART CATH AND CORONARY ANGIOGRAPHY; Location: [facility]; Surgeon: Glenetta Hew, MD   PERIPHERAL VASCULAR ATHERECTOMY Left 09/12/2020   Procedure: PERIPHERAL VASCULAR ATHERECTOMY;  Surgeon: Wellington Hampshire, MD;  Location: [facility];  Service: Cardiovascular;  Laterality: Left;   PERIPHERAL VASCULAR BALLOON ANGIOPLASTY  09/12/2020   Procedure: PERIPHERAL VASCULAR BALLOON ANGIOPLASTY;  Surgeon: Wellington Hampshire, MD;  Location: [facility];  Service: Cardiovascular;;   REVERSE SHOULDER ARTHROPLASTY Left 10/01/2021   Procedure: REVERSE SHOULDER ARTHROPLASTY WITH BICEPS TENODESIS.;  Surgeon: Corky Mull, MD;  Location: [facility];  Service:  Orthopedics;  Laterality: Left;   SHOULDER SURGERY Left 2017    Family History  Problem Relation Age of Onset   Heart attack Mother        First MI @ 77 - Died @ 55   Heart disease Mother    Heart disease Father        Died @ 73   Throat cancer Brother    Liver cancer Brother    Colon cancer Sister     No  Known Allergies  Current Outpatient Medications on File Prior to Visit  Medication Sig Dispense Refill   albuterol (VENTOLIN HFA) 108 (90 Base) MCG/ACT inhaler Inhale 2 puffs into the lungs every 6 (six) hours as needed for wheezing or shortness of breath. 8 g 0   carvedilol (COREG) 3.125 MG tablet Take 1 tablet (3.125 mg total) by mouth 2 (two) times daily. 180 tablet 3   clobetasol cream (TEMOVATE) 3.41 % Apply 1 Application topically 2 (two) times daily as needed. For vaginal itching. 30 g 0   clopidogrel (PLAVIX) 75 MG tablet Take 1 tablet (75 mg total) by mouth daily. 90 tablet 3   Continuous Blood Gluc Receiver (FREESTYLE LIBRE 2 READER) DEVI Use with sensor to check blood sugars 6 times daily. 1 each 0   Continuous Blood Gluc Sensor (FREESTYLE LIBRE 2 SENSOR) MISC USE TO CHECK SUGARS 6 TIMES DAILY - CHANGE OUT EVERY 14 DAYS 6 each 1   enalapril (VASOTEC) 10 MG tablet Take 1 tablet (10 mg total) by mouth daily. 90 tablet 3   ezetimibe (ZETIA) 10 MG tablet Take 1 tablet (10 mg total) by mouth daily. For cholesterol. 90 tablet 3   furosemide (LASIX) 20 MG tablet Take one tab only on Monday, Wednesday, & Fridays 45 tablet 3   gabapentin (NEURONTIN) 400 MG capsule Take 400 mg by mouth 2 (two) times daily.     glucose blood (FREESTYLE LITE) test strip Test up to four times a day as needed. DX 100 each 0   insulin glargine (LANTUS) 100 UNIT/ML Solostar Pen Inject 8 Units into the skin at bedtime.     Insulin Pen Needle (INSUPEN PEN NEEDLES) 32G X 4 MM MISC Use to inject insulin 4 times daily. 250 each 1   levothyroxine (SYNTHROID) 100 MCG tablet Take 100 mcg by mouth daily before  breakfast.     mirtazapine (REMERON) 7.5 MG tablet Take 1 tablet (7.5 mg total) by mouth at bedtime. For appetite and sleep. 90 tablet 0   ondansetron (ZOFRAN) 4 MG tablet Take 1 tablet (4 mg total) by mouth every 6 (six) hours as needed for nausea. 30 tablet 0   rosuvastatin (CRESTOR) 40 MG tablet Take 1 tablet (40 mg total) by mouth every evening. For cholesterol. 90 tablet 3   traZODone (DESYREL) 100 MG tablet TAKE 1 TABLET BY MOUTH AT BEDTIME AS NEEDED FOR SLEEP 90 tablet 2   amitriptyline (ELAVIL) 50 MG tablet Take 1 tablet (50 mg total) by mouth at bedtime as needed for sleep. 30 tablet 5   potassium chloride (KLOR-CON M) 10 MEQ tablet Only take on days you take your lasix. Take 1 tablet (10 mEq total) by mouth for 1 dose on those days you take your lasix '20mg'$  pill. (Patient not taking: Reported on 07/31/2022) 90 tablet 3   No current facility-administered medications on file prior to visit.    BP 128/82   Pulse 67   Temp 98.4 F (36.9 C) (Temporal)   Ht 5' 5.5" (1.664 m)   Wt 138 lb (62.6 kg)   SpO2 98%   BMI 22.62 kg/m  Objective:   Physical Exam Cardiovascular:     Rate and Rhythm: Normal rate and regular rhythm.  Pulmonary:     Effort: Pulmonary effort is normal.     Breath sounds: Normal breath sounds.  Musculoskeletal:     Cervical back: Neck supple.  Skin:    General: Skin is warm and dry.  Assessment & Plan:  Diabetes mellitus type 2 with peripheral artery disease ([place]) Assessment & Plan: Improved but still uncontrolled with A1C of 8.4 today. Difficult case as she is more brittle given her medical history and episodes of hypoglycemia.  She is taking Glipizide which is not on her current medication list. This is likely contributing to periods of hypoglycemia. Stop Glipizide.  Continue Novolog at 18 units in AM, reduce Novolog to 14 units in afternoon, continue Novology 16 units in PM with reduction to 14 units if eating a smaller meal. We discussed  to avoid checking glucose right after a meal, wait 2 hours.  She will closely monitor glucose levels and update.   Follow up in 3 months.  Orders: -     Microalbumin / creatinine urine ratio -     POCT glycosylated hemoglobin (Hb A1C) -     NovoLOG FlexPen; Inject 18 units into the skin with breakfast, 14 units with lunch, and 16 units with dinner for diabetes.  Dispense: 45 mL; Refill: 0  Mild episode of recurrent major depressive disorder [facility]) Assessment & Plan: Active, also not taking any meds on her medication list.  Resume mirtazipine 7.5 mg HS for appetite, depression, and sleep.  Follow up in 3 months, she will update sooner if no improvement.          Sarah Koch, NP

## 2022-07-31 NOTE — Assessment & Plan Note (Signed)
Active, also not taking any meds on her medication list.  Resume mirtazipine 7.5 mg HS for appetite, depression, and sleep.  Follow up in 3 months, she will update sooner if no improvement.

## 2022-07-31 NOTE — Assessment & Plan Note (Signed)
Improved but still uncontrolled with A1C of 8.4 today. Difficult case as she is more brittle given her medical history and episodes of hypoglycemia.  She is taking Glipizide which is not on her current medication list. This is likely contributing to periods of hypoglycemia. Stop Glipizide.  Continue Novolog at 18 units in AM, reduce Novolog to 14 units in afternoon, continue Novology 16 units in PM with reduction to 14 units if eating a smaller meal. We discussed to avoid checking glucose right after a meal, wait 2 hours.  She will closely monitor glucose levels and update.   Follow up in 3 months.

## 2022-08-06 ENCOUNTER — Encounter: Payer: Self-pay | Admitting: Student in an Organized Health Care Education/Training Program

## 2022-08-06 ENCOUNTER — Ambulatory Visit (INDEPENDENT_AMBULATORY_CARE_PROVIDER_SITE_OTHER): Payer: Medicare PPO

## 2022-08-06 ENCOUNTER — Ambulatory Visit
Payer: Medicare PPO | Attending: Student in an Organized Health Care Education/Training Program | Admitting: Student in an Organized Health Care Education/Training Program

## 2022-08-06 VITALS — Wt 138.0 lb

## 2022-08-06 VITALS — BP 152/80 | HR 65 | Temp 97.5°F | Resp 16 | Ht 66.0 in | Wt 138.0 lb

## 2022-08-06 DIAGNOSIS — G894 Chronic pain syndrome: Secondary | ICD-10-CM | POA: Diagnosis not present

## 2022-08-06 DIAGNOSIS — G629 Polyneuropathy, unspecified: Secondary | ICD-10-CM | POA: Insufficient documentation

## 2022-08-06 DIAGNOSIS — Z0000 Encounter for general adult medical examination without abnormal findings: Secondary | ICD-10-CM | POA: Diagnosis not present

## 2022-08-06 DIAGNOSIS — M5136 Other intervertebral disc degeneration, lumbar region: Secondary | ICD-10-CM | POA: Diagnosis not present

## 2022-08-06 DIAGNOSIS — M47812 Spondylosis without myelopathy or radiculopathy, cervical region: Secondary | ICD-10-CM | POA: Diagnosis not present

## 2022-08-06 DIAGNOSIS — Z720 Tobacco use: Secondary | ICD-10-CM | POA: Diagnosis not present

## 2022-08-06 MED ORDER — GABAPENTIN 600 MG PO TABS: 600.0000 mg | ORAL_TABLET | Freq: Two times a day (BID) | ORAL | 5 refills | Status: DC

## 2022-08-06 NOTE — Progress Notes (Signed)
Virtual Visit via Telephone Note  I connected with  Sarah Payne on 08/06/22 at  8:45 AM EST by telephone and verified that I am speaking with the correct person using two identifiers.  Location: Patient: home Provider: [place] Persons participating in the virtual visit: [place]   I discussed the limitations, risks, security and privacy concerns of performing an evaluation and management service by telephone and the availability of in person appointments. The patient expressed understanding and agreed to proceed.  Interactive audio and video telecommunications were attempted between this nurse and patient, however failed, due to patient having technical difficulties OR patient did not have access to video capability.  We continued and completed visit with audio only.  Some vital signs may be absent or patient reported.   Dionisio David, LPN  Subjective:   Sarah Payne is a 77 y.o. female who presents for Medicare Annual (Subsequent) preventive examination.  Review of Systems     Cardiac Risk Factors include: advanced age (>58mn, >>73women);diabetes mellitus;hypertension     Objective:    There were no vitals filed for this visit. There is no height or weight on file to calculate BMI.     08/06/2022    9:02 AM 10/01/2021    6:53 PM 10/01/2021    8:26 AM 09/24/2021    8:25 AM 05/15/2021    2:13 PM 02/06/2021   10:29 AM 09/12/2020    8:25 AM  Advanced Directives  Does Patient Have a Medical Advance Directive? No No No No No No No  Does patient want to make changes to medical advance directive?      No - Patient declined   Would patient like information on creating a medical advance directive? No - Patient declined No - Patient declined No - Patient declined  No - Patient declined  Yes (MAU/Ambulatory/Procedural Areas - Information given)    Current Medications (verified) Outpatient Encounter Medications as of 08/06/2022  Medication Sig    albuterol (VENTOLIN HFA) 108 (90 Base) MCG/ACT inhaler Inhale 2 puffs into the lungs every 6 (six) hours as needed for wheezing or shortness of breath.   carvedilol (COREG) 3.125 MG tablet Take 1 tablet (3.125 mg total) by mouth 2 (two) times daily.   clobetasol cream (TEMOVATE) 00.24% Apply 1 Application topically 2 (two) times daily as needed. For vaginal itching.   clopidogrel (PLAVIX) 75 MG tablet Take 1 tablet (75 mg total) by mouth daily.   Continuous Blood Gluc Receiver (FREESTYLE LIBRE 2 READER) DEVI Use with sensor to check blood sugars 6 times daily.   Continuous Blood Gluc Sensor (FREESTYLE LIBRE 2 SENSOR) MISC USE TO CHECK SUGARS 6 TIMES DAILY - CHANGE OUT EVERY 14 DAYS   enalapril (VASOTEC) 10 MG tablet Take 1 tablet (10 mg total) by mouth daily.   ezetimibe (ZETIA) 10 MG tablet Take 1 tablet (10 mg total) by mouth daily. For cholesterol.   furosemide (LASIX) 20 MG tablet Take one tab only on Monday, Wednesday, & Fridays   gabapentin (NEURONTIN) 400 MG capsule Take 400 mg by mouth 2 (two) times daily.   glucose blood (FREESTYLE LITE) test strip Test up to four times a day as needed. DX   insulin aspart (NOVOLOG FLEXPEN) 100 UNIT/ML FlexPen Inject 18 units into the skin with breakfast, 14 units with lunch, and 16 units with dinner for diabetes.   Insulin Pen Needle (INSUPEN PEN NEEDLES) 32G X 4 MM MISC Use to inject insulin 4  times daily.   levothyroxine (SYNTHROID) 100 MCG tablet Take 100 mcg by mouth daily before breakfast.   mirtazapine (REMERON) 7.5 MG tablet Take 1 tablet (7.5 mg total) by mouth at bedtime. For appetite and sleep.   ondansetron (ZOFRAN) 4 MG tablet Take 1 tablet (4 mg total) by mouth every 6 (six) hours as needed for nausea.   potassium chloride (KLOR-CON M) 10 MEQ tablet Only take on days you take your lasix. Take 1 tablet (10 mEq total) by mouth for 1 dose on those days you take your lasix '20mg'$  pill.   rosuvastatin (CRESTOR) 40 MG tablet Take 1 tablet (40 mg  total) by mouth every evening. For cholesterol.   traZODone (DESYREL) 100 MG tablet TAKE 1 TABLET BY MOUTH AT BEDTIME AS NEEDED FOR SLEEP   amitriptyline (ELAVIL) 50 MG tablet Take 1 tablet (50 mg total) by mouth at bedtime as needed for sleep.   insulin glargine (LANTUS) 100 UNIT/ML Solostar Pen Inject 8 Units into the skin at bedtime. (Patient not taking: Reported on 08/06/2022)   No facility-administered encounter medications on file as of 08/06/2022.    Allergies (verified) Patient has no known allergies.   History: Past Medical History:  Diagnosis Date   Aortic atherosclerosis ([place])    Bilateral cataracts 01/2015   a.) s/p extraction   CHF (congestive heart failure) ([place]) 09/16/2013   a.) TTE 09/16/2013: EF 55-60%; mod inferior HK, triv TR; G1DD. b.) TTE 01/02/2016: EF 25-30%; mid-apicalateroseptal, lat, inf, inferoseptal, apical akinesis; triv TR; G1DD. c. TTE 04/16/2016: EF 65%; G1DD. d.) TTE 05/02/2020: EF 50-55%; G2DD. e.) TTE 04/11/2021: EF 55%; G1DD.   Chronic low back pain    Clostridioides difficile infection 2015   COPD (chronic obstructive pulmonary disease) (HCC)    Coronary artery disease    a.) PCI 02/06/2011: 100% mLCx - DES x 3. b.) PCI 09/17/2011: 95% dRCA - DES x 1. c.) PCI 10/02/2011: 75% pLCX - DES x 1. d.) PCI 08/19/2012: DES x 2 p-mRCA.  e.) [place] 01/01/16: Takotsubo event 01/01/2016 with patent stents   Depression    Diverticulosis    Frequent PVCs    a. Noted in hospital 12/2015.   GERD (gastroesophageal reflux disease)    History of heart artery stent    a.) TOTAL of 7 stents: a.) 02/06/2011 - overlapping 2.5x12 mm Xience V, 2.5x8 mm Xience V, 2.25x8 mm Xience Nano to mLCx. b.) 09/17/2011 - 2.5x23 Xience V dRCA. c.) 10/02/2011 - 2.5x15 mm Xience V pLCx. d.) 08/09/2012 - overlapping 3.0x22m mRCA and 3.5x247mpRCA   Hyperlipidemia    Hypertension    Hypothyroidism    Impingement syndrome of left shoulder    LBBB (left bundle branch block)    Long term current  use of antithrombotics/antiplatelets    a.) clopidogrel   Marijuana abuse    [facility]   a.) multiple MIs;  5 per her report   OSA (obstructive sleep apnea)    a.) mild; does not require nocturnal PAP therapy   Paroxysmal SVT (supraventricular tachycardia) 05/08/2021   a.)  Zio patch 05/08/2021: 3 distinct SVT runs; fastest 5 beats at a rate of 146 bpm; longest 7 beats at rate of 134 bpm   T2DM (type 2 diabetes mellitus) ([facility]   Takotsubo cardiomyopathy    a. 12/2015 - nephew committed suicide 1 week prior, sister died the morning of presentation - initially called a STEMI; cath with patent stents. LVEF 25-30%.   Tendonitis of left rotator  cuff    Tobacco abuse    Vascular dementia    Past Surgical History:  Procedure Laterality Date   ABDOMINAL AORTOGRAM W/LOWER EXTREMITY N/A 09/12/2020   Procedure: ABDOMINAL AORTOGRAM W/LOWER EXTREMITY;  Surgeon: Wellington Hampshire, MD;  Location: [place] [facility];  Service: Cardiovascular;  Laterality: N/A;   ABDOMINAL HYSTERECTOMY     APPENDECTOMY     BACK SURGERY     CARDIAC CATHETERIZATION N/A 01/01/2016   Procedure: Left Heart Cath and Coronary Angiography;  Surgeon: Jettie Booze, MD;  Location: [place] [facility];  Service: Cardiovascular;  Laterality: N/A;   CATARACT EXTRACTION W/PHACO Left 01/30/2015   Procedure: CATARACT EXTRACTION PHACO AND INTRAOCULAR LENS PLACEMENT (IOC);  Surgeon: Birder Robson, MD;  Location: [facility];  Service: Ophthalmology;  Laterality: Left;  [place] 00:47    CATARACT EXTRACTION W/PHACO Right 02/13/2015   Procedure: CATARACT EXTRACTION PHACO AND INTRAOCULAR LENS PLACEMENT (IOC);  Surgeon: Birder Robson, MD;  Location: [facility];  Service: Ophthalmology;  Laterality: Right;  cassette lot # [id number] H [place]  00:29.9 AP  20.7 CDE  6.20   COLONOSCOPY N/A 11/02/2014   Procedure: COLONOSCOPY;  Surgeon: Inda Castle, MD;  Location: [place];  Service: Endoscopy;  Laterality: N/A;   CORONARY  ANGIOPLASTY WITH STENT PLACEMENT Left 02/06/2011   Procedure: CORONARY ANGIOPLASTY WITH STENT PLACEMENT; Location: [place]; Surgeon: Katrine Coho, MD   CORONARY ANGIOPLASTY WITH STENT PLACEMENT Left 09/17/2011   Procedure: CORONARY ANGIOPLASTY WITH STENT PLACEMENT; Location: [place]; Surgeon: Katrine Coho, MD   CORONARY ANGIOPLASTY WITH STENT PLACEMENT Left 10/02/2011   Procedure: CORONARY ANGIOPLASTY WITH STENT PLACEMENT; Location: [place]; Surgeon: Katrine Coho, MD   CORONARY ANGIOPLASTY WITH STENT PLACEMENT Left 08/19/2012   Procedure: CORONARY ANGIOPLASTY WITH STENT PLACEMENT; Location: [place]; Surgeon: Kathlyn Sacramento, MD   ESOPHAGOGASTRODUODENOSCOPY (EGD) WITH PROPOFOL N/A 09/21/2018   Procedure: ESOPHAGOGASTRODUODENOSCOPY (EGD) WITH PROPOFOL;  Surgeon: [name], MD;  [facility];  Service: Gastroenterology;  Laterality: N/A;   LEFT HEART CATH AND CORONARY ANGIOGRAPHY Left 08/02/2014   Procedure: LEFT HEART CATH AND CORONARY ANGIOGRAPHY; Location: [facility] [place]; Surgeon: Glenetta Hew, MD   PERIPHERAL VASCULAR ATHERECTOMY Left 09/12/2020   Procedure: PERIPHERAL VASCULAR ATHERECTOMY;  Surgeon: Wellington Hampshire, MD;  Location: [place] [facility];  Service: Cardiovascular;  Laterality: Left;   PERIPHERAL VASCULAR BALLOON ANGIOPLASTY  09/12/2020   Procedure: PERIPHERAL VASCULAR BALLOON ANGIOPLASTY;  Surgeon: Wellington Hampshire, MD;  Location: [place] [facility];  Service: Cardiovascular;;   REVERSE SHOULDER ARTHROPLASTY Left 10/01/2021   Procedure: REVERSE SHOULDER ARTHROPLASTY WITH BICEPS TENODESIS.;  Surgeon: Corky Mull, MD;  Location: [facility];  Service: Orthopedics;  Laterality: Left;   SHOULDER SURGERY Left 2017   Family History  Problem Relation Age of Onset   Heart attack Mother        First MI @ 63 - Died @ 51   Heart disease Mother    Heart disease Father        Died @ 74   Throat cancer Brother    Liver cancer Brother    Colon cancer Sister    Social History    Socioeconomic History   Marital status: Married    Spouse name: Not on file   Number of children: Not on file   Years of education: Not on file   Highest education level: Not on file  Occupational History   Not on file  Tobacco Use   Smoking status: Every Day    Packs/day: 1.00  Years: 45.00    Total pack years: 45.00    Types: Cigarettes   Smokeless tobacco: Never   Tobacco comments:    Has cut back, trying to quit.   Vaping Use   Vaping Use: Never used  Substance and Sexual Activity   Alcohol use: No    Alcohol/week: 0.0 standard drinks of alcohol   Drug use: Yes    Types: Marijuana    Comment: last noc   Sexual activity: Not on file  Other Topics Concern   Not on file  Social History Narrative   Lives at home with her husband in [place].  Previously used marijuana - quit.      Regular exercise: no/ pain from a frozen rotator cuff   Caffeine use: coffee daily and pepsi      Does not have a living will.   Daughters and husband know her wishes- would desire CPR but not prolonged life support if futile   Social Determinants of Health   Financial Resource Strain: Low Risk  (08/06/2022)   Overall Financial Resource Strain (CARDIA)    Difficulty of Paying Living Expenses: Not hard at all  Food Insecurity: No Food Insecurity (08/06/2022)   Hunger Vital Sign    Worried About Running Out of Food in the Last Year: Never true    Ran Out of Food in the Last Year: Never true  Transportation Needs: No Transportation Needs (08/06/2022)   PRAPARE - Hydrologist (Medical): No    Lack of Transportation (Non-Medical): No  Physical Activity: Inactive (08/06/2022)   Exercise Vital Sign    Days of Exercise per Week: 0 days    Minutes of Exercise per Session: 0 min  Stress: No Stress Concern Present (08/06/2022)   [place]    Feeling of Stress : Only a little  Social Connections:  Socially Isolated (08/06/2022)   Social Connection and Isolation Panel [NHANES]    Frequency of Communication with Friends and Family: Never    Frequency of Social Gatherings with Friends and Family: Never    Attends Religious Services: Never    Marine scientist or Organizations: No    Attends Music therapist: Never    Marital Status: Married    Tobacco Counseling Ready to quit: Not Answered Counseling given: Not Answered Tobacco comments: Has cut back, trying to quit.    Clinical Intake:  Pre-visit preparation completed: Yes  Pain : No/denies pain     Diabetes: Yes CBG done?: No Did pt. bring in CBG monitor from home?: No  How often do you need to have someone help you when you read instructions, pamphlets, or other written materials from your doctor or pharmacy?: 1 - Never  Diabetic?yes Nutrition Risk Assessment:  Has the patient had any N/V/D within the last 2 months?  No  Does the patient have any non-healing wounds?  No  Has the patient had any unintentional weight loss or weight gain?  Yes   Diabetes:  Is the patient diabetic?  Yes  If diabetic, was a CBG obtained today?  No  Did the patient bring in their glucometer from home?  No  How often do you monitor your CBG's? daily.   Financial Strains and Diabetes Management:  Are you having any financial strains with the device, your supplies or your medication? No .  Does the patient want to be seen by Chronic Care Management for management of  their diabetes?  No  Would the patient like to be referred to a Nutritionist or for Diabetic Management?  No   Diabetic Exams:  Diabetic Eye Exam: Completed 10/10/20.  Pt has been advised about the importance in completing this exam.  Diabetic Foot Exam: Completed 01/14/22. Pt has been advised about the importance in completing this exam.     Interpreter Needed?: No  Information entered by :: Kirke Shaggy, LPN   Activities of Daily Living     08/06/2022    9:03 AM 02/18/2022    9:47 AM  In your present state of health, do you have any difficulty performing the following activities:  Hearing? 0 0  Vision? 0 0  Difficulty concentrating or making decisions? 1 0  Walking or climbing stairs? 0 0  Dressing or bathing? 0 0  Doing errands, shopping? 0 0  Preparing Food and eating ? N   Using the Toilet? N   In the past six months, have you accidently leaked urine? N   Do you have problems with loss of bowel control? N   Managing your Medications? N   Managing your Finances? N   Housekeeping or managing your Housekeeping? N     Patient Care Team: [name], NP as PCP - General (Internal Medicine) Rockey Situ Kathlene November, MD as PCP - Cardiology (Cardiology) Minna Merritts, MD (Cardiology) Inda Castle, MD (Inactive) as Consulting Physician (Gastroenterology) Meade Maw, MD as Consulting Physician (Neurosurgery) Philemon Kingdom, MD as Consulting Physician (Internal Medicine) Minna Merritts, MD as Consulting Physician (Cardiology) [name], [facility] as Pharmacist (Pharmacist) Birder Robson, MD as Referring Physician (Ophthalmology)  Indicate any recent Medical Services you may have received from other than [facility] providers in the past year (date may be approximate).     Assessment:   This is a routine wellness examination for [place].  Hearing/Vision screen Hearing Screening - Comments:: No aids Vision Screening - Comments:: Wears glasses-  [facility]  Dietary issues and exercise activities discussed: Current Exercise Habits: The patient does not participate in regular exercise at present   Goals Addressed             This Visit's Progress    DIET - EAT MORE FRUITS AND VEGETABLES         Depression Screen    08/06/2022    8:59 AM 07/31/2022   11:02 AM 02/18/2022    9:46 AM 01/14/2022   10:41 AM 10/11/2021   11:09 AM 09/05/2021   11:15 AM 02/06/2021   10:31 AM  PHQ 2/9 Scores  PHQ - 2  Score '2 6 3 3 3 6 '$ 0  PHQ- 9 Score '7 14 12 13 21 15 '$ 0    Fall Risk    08/06/2022    9:02 AM 07/31/2022   11:02 AM 02/18/2022    9:47 AM 01/30/2022   10:13 AM 01/14/2022   10:42 AM  Fall Risk   Falls in the past year? 0 '1 1 1 '$ 0  Number falls in past yr: 0 '1 1 1 '$ 0  Comment    Pt states she blacks out and falls   Injury with Fall? 0 0 0 0 0  Risk for fall due to : No Fall Risks History of fall(s) History of fall(s)    Follow up Falls prevention discussed;Falls evaluation completed Falls evaluation completed   Falls evaluation completed    FALL RISK PREVENTION PERTAINING TO THE HOME:  Any stairs in or around the home?  Yes  If so, are there any without handrails? No  Home free of loose throw rugs in walkways, pet beds, electrical cords, etc? Yes  Adequate lighting in your home to reduce risk of falls? Yes   ASSISTIVE DEVICES UTILIZED TO PREVENT FALLS:  Life alert? No  Use of a cane, walker or w/c? No  Grab bars in the bathroom? Yes  Shower chair or bench in shower? Yes  Elevated toilet seat or a handicapped toilet? Yes   Cognitive Function:    02/06/2021   10:33 AM 11/17/2017   10:22 AM  MMSE - Mini Mental State Exam  Not completed: Refused   Orientation to time  5  Orientation to Place  5  Registration  3  Attention/ Calculation  0  Recall  3  Language- name 2 objects  0  Language- repeat  1  Language- follow 3 step command  3  Language- read & follow direction  0  Write a sentence  0  Copy design  0  Total score  20        08/06/2022    9:08 AM  6CIT Screen  What Year? 0 points  What month? 0 points  What time? 0 points  Count back from 20 0 points  Months in reverse 0 points  Repeat phrase 0 points  Total Score 0 points    Immunizations Immunization History  Administered Date(s) Administered   COVID-19, mRNA, vaccine(Comirnaty)12 years and older 05/01/2022   Fluad Quad(high Dose 65+) 04/02/2020, 04/16/2022   Influenza Split 05/20/2012   Influenza, High  Dose Seasonal PF 04/17/2016, 04/17/2017, 05/04/2018   Influenza, Seasonal, Injecte, Preservative Fre 05/07/2006   Influenza,inj,Quad PF,6+ Mos 04/20/2013, 04/26/2014, 06/01/2015, 04/25/2019   PFIZER(Purple Top)SARS-COV-2 Vaccination 09/15/2019, 10/11/2019, 06/05/2020   Pneumococcal Conjugate-13 04/26/2014   Pneumococcal Polysaccharide-23 03/28/2013, 04/25/2019   Tdap 03/28/2013   Zoster Recombinat (Shingrix) 02/15/2021, 01/07/2022    TDAP status: Up to date  Flu Vaccine status: Up to date  Pneumococcal vaccine status: Up to date  Covid-19 vaccine status: Completed vaccines  Qualifies for Shingles Vaccine? Yes   Zostavax completed No   Shingrix Completed?: Yes  Screening Tests Health Maintenance  Topic Date Due   OPHTHALMOLOGY EXAM  10/10/2021   Lung Cancer Screening  05/13/2022   Diabetic kidney evaluation - eGFR measurement  10/04/2022   FOOT EXAM  01/15/2023   HEMOGLOBIN A1C  01/29/2023   DTaP/Tdap/Td (2 - Td or Tdap) 03/29/2023   Diabetic kidney evaluation - Urine ACR  08/01/2023   Medicare Annual Wellness (AWV)  08/07/2023   COLONOSCOPY (Pts 45-51yr Insurance coverage will need to be confirmed)  11/01/2024   Pneumonia Vaccine 75 Years old  Completed   INFLUENZA VACCINE  Completed   DEXA SCAN  Completed   Hepatitis C Screening  Completed   Zoster Vaccines- Shingrix  Completed   HPV VACCINES  Aged Out   COVID-19 Vaccine  Discontinued    Health Maintenance  Health Maintenance Due  Topic Date Due   OPHTHALMOLOGY EXAM  10/10/2021   Lung Cancer Screening  05/13/2022    Colorectal cancer screening: Type of screening: Colonoscopy. Completed 11/02/14. Repeat every 10 years  Mammogram status: No longer required due to age.  Bone Density status: Ordered 02/18/22. Pt provided with contact info and advised to call to schedule appt.  Lung Cancer Screening: (Low Dose CT Chest recommended if Age 75-80years, 30 pack-year currently smoking OR have quit w/in 15years.) does  qualify.   Lung Cancer Screening  Referral: declined referral  Additional Screening:  Hepatitis C Screening: does qualify; Completed 03/12/15  Vision Screening: Recommended annual ophthalmology exams for early detection of glaucoma and other disorders of the eye. Is the patient up to date with their annual eye exam?  Yes  Who is the provider or what is the name of the office in which the patient attends annual eye exams? [place] If pt is not established with a provider, would they like to be referred to a provider to establish care? No .   Dental Screening: Recommended annual dental exams for proper oral hygiene  Community Resource Referral / Chronic Care Management: CRR required this visit?  No   CCM required this visit?  No      Plan:     I have personally reviewed and noted the following in the patient's chart:   Medical and social history Use of alcohol, tobacco or illicit drugs  Current medications and supplements including opioid prescriptions. Patient is not currently taking opioid prescriptions. Functional ability and status Nutritional status Physical activity Advanced directives List of other physicians Hospitalizations, surgeries, and ER visits in previous 12 months Vitals Screenings to include cognitive, depression, and falls Referrals and appointments  In addition, I have reviewed and discussed with patient certain preventive protocols, quality metrics, and best practice recommendations. A written personalized care plan for preventive services as well as general preventive health recommendations were provided to patient.     Dionisio David, LPN   09/11/9796   Nurse Notes: none

## 2022-08-06 NOTE — Patient Instructions (Signed)
Sarah Payne , Thank you for taking time to come for your Medicare Wellness Visit. I appreciate your ongoing commitment to your health goals. Please review the following plan we discussed and let me know if I can assist you in the future.   Screening recommendations/referrals: Colonoscopy: 11/02/14 Mammogram: aged out Bone Density: ordered 02/18/22 Recommended yearly ophthalmology/optometry visit for glaucoma screening and checkup Recommended yearly dental visit for hygiene and checkup  Vaccinations: Influenza vaccine: 04/16/22 Pneumococcal vaccine: 04/25/19 Tdap vaccine: 03/28/13 Shingles vaccine: Shingrix 02/15/21, 01/07/22   Covid-19:09/15/19, 10/11/19, 06/05/20  Advanced directives: no  Conditions/risks identified: none  Next appointment: Follow up in one year for your annual wellness visit 08/10/23 @ 8:15 am by phone   Preventive Care 65 Years and Older, Female Preventive care refers to lifestyle choices and visits with your health care provider that can promote health and wellness. What does preventive care include? A yearly physical exam. This is also called an annual well check. Dental exams once or twice a year. Routine eye exams. Ask your health care provider how often you should have your eyes checked. Personal lifestyle choices, including: Daily care of your teeth and gums. Regular physical activity. Eating a healthy diet. Avoiding tobacco and drug use. Limiting alcohol use. Practicing safe sex. Taking low-dose aspirin every day. Taking vitamin and mineral supplements as recommended by your health care provider. What happens during an annual well check? The services and screenings done by your health care provider during your annual well check will depend on your age, overall health, lifestyle risk factors, and family history of disease. Counseling  Your health care provider may ask you questions about your: Alcohol use. Tobacco use. Drug use. Emotional well-being. Home  and relationship well-being. Sexual activity. Eating habits. History of falls. Memory and ability to understand (cognition). Work and work Statistician. Reproductive health. Screening  You may have the following tests or measurements: Height, weight, and BMI. Blood pressure. Lipid and cholesterol levels. These may be checked every 5 years, or more frequently if you are over 59 years old. Skin check. Lung cancer screening. You may have this screening every year starting at age 17 if you have a 30-pack-year history of smoking and currently smoke or have quit within the past 15 years. Fecal occult blood test (FOBT) of the stool. You may have this test every year starting at age 53. Flexible sigmoidoscopy or colonoscopy. You may have a sigmoidoscopy every 5 years or a colonoscopy every 10 years starting at age 67. Hepatitis C blood test. Hepatitis B blood test. Sexually transmitted disease (STD) testing. Diabetes screening. This is done by checking your blood sugar (glucose) after you have not eaten for a while (fasting). You may have this done every 1-3 years. Bone density scan. This is done to screen for osteoporosis. You may have this done starting at age 10. Mammogram. This may be done every 1-2 years. Talk to your health care provider about how often you should have regular mammograms. Talk with your health care provider about your test results, treatment options, and if necessary, the need for more tests. Vaccines  Your health care provider may recommend certain vaccines, such as: Influenza vaccine. This is recommended every year. Tetanus, diphtheria, and acellular pertussis (Tdap, Td) vaccine. You may need a Td booster every 10 years. Zoster vaccine. You may need this after age 88. Pneumococcal 13-valent conjugate (PCV13) vaccine. One dose is recommended after age 43. Pneumococcal polysaccharide (PPSV23) vaccine. One dose is recommended after age 1. Talk  to your health care provider  about which screenings and vaccines you need and how often you need them. This information is not intended to replace advice given to you by your health care provider. Make sure you discuss any questions you have with your health care provider. Document Released: 08/03/2015 Document Revised: 03/26/2016 Document Reviewed: 05/08/2015 Elsevier Interactive Patient Education  2017 [place] Prevention in the Home Falls can cause injuries. They can happen to people of all ages. There are many things you can do to make your home safe and to help prevent falls. What can I do on the outside of my home? Regularly fix the edges of walkways and driveways and fix any cracks. Remove anything that might make you trip as you walk through a door, such as a raised step or threshold. Trim any bushes or trees on the path to your home. Use bright outdoor lighting. Clear any walking paths of anything that might make someone trip, such as rocks or tools. Regularly check to see if handrails are loose or broken. Make sure that both sides of any steps have handrails. Any raised decks and porches should have guardrails on the edges. Have any leaves, snow, or ice cleared regularly. Use sand or salt on walking paths during winter. Clean up any spills in your garage right away. This includes oil or grease spills. What can I do in the bathroom? Use night lights. Install grab bars by the toilet and in the tub and shower. Do not use towel bars as grab bars. Use non-skid mats or decals in the tub or shower. If you need to sit down in the shower, use a plastic, non-slip stool. Keep the floor dry. Clean up any water that spills on the floor as soon as it happens. Remove soap buildup in the tub or shower regularly. Attach bath mats securely with double-sided non-slip rug tape. Do not have throw rugs and other things on the floor that can make you trip. What can I do in the bedroom? Use night lights. Make sure  that you have a light by your bed that is easy to reach. Do not use any sheets or blankets that are too big for your bed. They should not hang down onto the floor. Have a firm chair that has side arms. You can use this for support while you get dressed. Do not have throw rugs and other things on the floor that can make you trip. What can I do in the kitchen? Clean up any spills right away. Avoid walking on wet floors. Keep items that you use a lot in easy-to-reach places. If you need to reach something above you, use a strong step stool that has a grab bar. Keep electrical cords out of the way. Do not use floor polish or wax that makes floors slippery. If you must use wax, use non-skid floor wax. Do not have throw rugs and other things on the floor that can make you trip. What can I do with my stairs? Do not leave any items on the stairs. Make sure that there are handrails on both sides of the stairs and use them. Fix handrails that are broken or loose. Make sure that handrails are as long as the stairways. Check any carpeting to make sure that it is firmly attached to the stairs. Fix any carpet that is loose or worn. Avoid having throw rugs at the top or bottom of the stairs. If you do have throw rugs,  attach them to the floor with carpet tape. Make sure that you have a light switch at the top of the stairs and the bottom of the stairs. If you do not have them, ask someone to add them for you. What else can I do to help prevent falls? Wear shoes that: Do not have high heels. Have rubber bottoms. Are comfortable and fit you well. Are closed at the toe. Do not wear sandals. If you use a stepladder: Make sure that it is fully opened. Do not climb a closed stepladder. Make sure that both sides of the stepladder are locked into place. Ask someone to hold it for you, if possible. Clearly mark and make sure that you can see: Any grab bars or handrails. First and last steps. Where the edge of  each step is. Use tools that help you move around (mobility aids) if they are needed. These include: Canes. Walkers. Scooters. Crutches. Turn on the lights when you go into a dark area. Replace any light bulbs as soon as they burn out. Set up your furniture so you have a clear path. Avoid moving your furniture around. If any of your floors are uneven, fix them. If there are any pets around you, be aware of where they are. Review your medicines with your doctor. Some medicines can make you feel dizzy. This can increase your chance of falling. Ask your doctor what other things that you can do to help prevent falls. This information is not intended to replace advice given to you by your health care provider. Make sure you discuss any questions you have with your health care provider. Document Released: 05/03/2009 Document Revised: 12/13/2015 Document Reviewed: 08/11/2014 Elsevier Interactive Patient Education  2017 [employer].

## 2022-08-06 NOTE — Progress Notes (Signed)
Safety precautions to be maintained throughout the outpatient stay will include: orient to surroundings, keep bed in low position, maintain call bell within reach at all times, provide assistance with transfer out of bed and ambulation.

## 2022-08-06 NOTE — Progress Notes (Signed)
PROVIDER NOTE: Information contained herein reflects review and annotations entered in association with encounter. Interpretation of such information and data should be left to medically-trained personnel. Information provided to patient can be located elsewhere in the medical record under "Patient Instructions". Document created using STT-dictation technology, any transcriptional errors that may result from process are unintentional.    Patient: Sarah Payne  Service Category: E/M  Provider: Gillis Santa, MD  DOB: 1945/10/23  DOS: 08/06/2022  Specialty: Interventional Pain Management  MRN: [id number]  Setting: Ambulatory outpatient  PCP: Sarah Koch, NP  Type: Established Patient    Referring Provider: [name], NP  Location: Office  Delivery: Face-to-face     HPI  [name], a 77 y.o. year old female, is here today because of her Peripheral polyneuropathy [G62.9]. [name] primary complain today is Back Pain (low)  Last encounter: My last encounter with her was on 01/30/22  Pertinent problems: [name] has CAD, multiple vessel; History of lumbar fusion; Diabetes mellitus type 2 with peripheral artery disease (Sarah Payne); Bronchitis, chronic obstructive (Sarah Payne); Obstructive sleep apnea; COPD (chronic obstructive pulmonary disease) (Sarah Payne); Lumbar degenerative disc disease; Lumbar spondylosis with myelopathy; Cannabis use disorder, mild, abuse; Peripheral polyneuropathy; Tobacco dependence; Chronic pain syndrome; Chronic neck pain; Chronic pain of right upper extremity; Cervical spondylosis; and Chronic musculoskeletal pain on their pertinent problem list. Pain Assessment: Severity of Chronic pain is reported as a 4 /10. Location: Back Lower/sometimes goes down both legs to knee. Onset: More than a month ago. Quality: Sharp. Timing: Constant. Modifying factor(s): meds are not helping as much. Vitals:  height is '5\' 6"'$  (1.676 m) and weight is 138 lb (62.6 kg). Her temperature is 97.5 F  (36.4 C) (abnormal). Her blood pressure is 152/80 (abnormal) and her pulse is 65. Her respiration is 16 and oxygen saturation is 98%.   Reason for encounter: medication management.   -MM of Gabapentin. She continues to have decreased kidney function and given that her GFR is 33.  For this reason, we had reduced her gabapentin to twice daily dosing.  At her last clinic visit in July, I recommended that her dose to be 600 mg twice a day however she has been taking 400 mg twice a day and states that she has more neuropathic pain of her legs.  I recommend that she increase her dose to 600 mg twice a day, her maximum daily dose is 1400 mg in 2 divided doses given her GFR.  I will increase her gabapentin to 600 mg twice a day and see how she does with that.  I have instructed her that should she have a further reduction in her kidney function, she is to notify me and we may have to further decrease her gabapentin dose.  Patient endorsed understanding.  ROS  Constitutional: Denies any fever or chills Gastrointestinal: No reported hemesis, hematochezia, vomiting, or acute GI distress Musculoskeletal: +LBP Neurological: No reported episodes of acute onset apraxia, aphasia, dysarthria, agnosia, amnesia, paralysis, loss of coordination, or loss of consciousness  Medication Review  FreeStyle Libre 2 Reader, FreeStyle Libre 2 Sensor, Insulin Pen Needle, albuterol, amitriptyline, carvedilol, clobetasol cream, clopidogrel, enalapril, ezetimibe, furosemide, gabapentin, glucose blood, insulin aspart, insulin glargine, levothyroxine, mirtazapine, ondansetron, potassium chloride, rosuvastatin, and traZODone  History Review  Allergy: [name] has No Known Allergies. Drug: [name]  reports current drug use. Drug: Marijuana. Alcohol:  reports no history of alcohol use. Tobacco:  reports that she has been smoking cigarettes. She has  a 45.00 pack-year smoking history. She has never used smokeless tobacco. Social:  [name]  reports that she has been smoking cigarettes. She has a 45.00 pack-year smoking history. She has never used smokeless tobacco. She reports current drug use. Drug: Marijuana. She reports that she does not drink alcohol. Medical:  has a past medical history of Aortic atherosclerosis ([place]), Bilateral cataracts (01/2015), CHF (congestive heart failure) ([place]) (09/16/2013), Chronic low back pain, Clostridioides difficile infection (2015), COPD (chronic obstructive pulmonary disease) ([place]), Coronary artery disease, Depression, Diverticulosis, Frequent PVCs, GERD (gastroesophageal reflux disease), History of heart artery stent, Hyperlipidemia, Hypertension, Hypothyroidism, Impingement syndrome of left shoulder, LBBB (left bundle branch block), Long term current use of antithrombotics/antiplatelets, Marijuana abuse, Myocardial infarction ([place]), OSA (obstructive sleep apnea), Paroxysmal SVT (supraventricular tachycardia) (05/08/2021), T2DM (type 2 diabetes mellitus) ([place]), Takotsubo cardiomyopathy, Tendonitis of left rotator cuff, Tobacco abuse, and Vascular dementia. Surgical: [name]  has a past surgical history that includes Back surgery; Appendectomy; Abdominal hysterectomy; Colonoscopy (N/A, 11/02/2014); Cataract extraction w/PHACO (Left, 01/30/2015); Coronary angioplasty with stent (Left, 02/06/2011); Coronary angioplasty with stent (Left, 09/17/2011); Cataract extraction w/PHACO (Right, 02/13/2015); Cardiac catheterization (N/A, 01/01/2016); Esophagogastroduodenoscopy (egd) with propofol (N/A, 09/21/2018); Shoulder surgery (Left, 2017); ABDOMINAL AORTOGRAM W/LOWER EXTREMITY (N/A, 09/12/2020); PERIPHERAL VASCULAR ATHERECTOMY (Left, 09/12/2020); PERIPHERAL VASCULAR BALLOON ANGIOPLASTY (09/12/2020); Coronary angioplasty with stent (Left, 10/02/2011); Coronary angioplasty with stent (Left, 08/19/2012); LEFT HEART CATH AND CORONARY ANGIOGRAPHY (Left, 08/02/2014); and Reverse shoulder arthroplasty (Left,  10/01/2021). Family: family history includes Colon cancer in her sister; Heart attack in her mother; Heart disease in her father and mother; Liver cancer in her brother; Throat cancer in her brother.  Laboratory Chemistry Profile   Renal Lab Results  Component Value Date   BUN 39 (H) 10/03/2021   CREATININE 1.64 (H) 10/03/2021   BCR 17 04/19/2021   GFR 44.80 (L) 08/03/2020   GFRAA 55 (L) 08/30/2020   GFRNONAA 33 (L) 10/03/2021     Hepatic Lab Results  Component Value Date   AST 23 09/24/2021   ALT 21 09/24/2021   ALBUMIN 3.9 09/24/2021   ALKPHOS 66 09/24/2021   HCVAB NEGATIVE 03/12/2015   AMYLASE 32 04/11/2016   LIPASE 55.0 04/11/2016     Electrolytes Lab Results  Component Value Date   NA 135 10/03/2021   K 4.3 10/03/2021   CL 99 10/03/2021   CALCIUM 9.1 10/03/2021   MG 1.6 (L) 10/03/2021     Bone Lab Results  Component Value Date   VD25OH 41 02/25/2013     Inflammation (CRP: Acute Phase) (ESR: Chronic Phase) Lab Results  Component Value Date   CRP 2.1 04/11/2013   ESRSEDRATE 58 (H) 04/11/2013       Note: Above Lab results reviewed.  Recent Imaging Review  ECHOCARDIOGRAM COMPLETE    ECHOCARDIOGRAM REPORT       Patient Name:   AIDELIZ GARMANY Date of Exam: 10/03/2021 Medical Rec #:  [id number]     Height:       67.0 in Accession #:    [id number]    Weight:       143.4 lb Date of Birth:  22-Mar-1946     BSA:          1.756 m Patient Age:    77 years      BP:           125/57 mmHg Patient Gender: F             HR:  96 bpm. Exam Location:  [facility]  Procedure: 2D Echo, Color Doppler and Cardiac Doppler  Indications:     CHF-acute diastolic J50.09   History:         Patient has prior history of Echocardiogram examinations, most                  recent 04/11/2021. CHF, COPD; Risk Factors:Hypertension.   Sonographer:     Sherrie Sport Referring Phys:  [place] Diagnosing Phys: Ida Rogue MD    Sonographer Comments: No parasternal window,  suboptimal apical window and no subcostal window. IMPRESSIONS   1. Challenging images  2. Left ventricular ejection fraction, by estimation, is 55 to 60%. The left ventricle has normal function. Septal/anteroseptal wall motion abnormality possibly secondary to LBBB. Left ventricular diastolic parameters are consistent with Grade I  diastolic dysfunction (impaired relaxation).  3. Right ventricular systolic function is normal. The right ventricular size is normal. There is normal pulmonary artery systolic pressure. The estimated right ventricular systolic pressure is 38.1 mmHg.  4. The mitral valve is normal in structure. No evidence of mitral valve regurgitation. No evidence of mitral stenosis.  5. The aortic valve was not well visualized. Aortic valve regurgitation is not visualized. No aortic stenosis is present.  6. The inferior vena cava is normal in size with greater than 50% respiratory variability, suggesting right atrial pressure of 3 mmHg.  FINDINGS  Left Ventricle: Left ventricular ejection fraction, by estimation, is 55 to 60%. The left ventricle has normal function. The left ventricle has no regional wall motion abnormalities. The left ventricular internal cavity size was normal in size. There is  no left ventricular hypertrophy. Left ventricular diastolic parameters are consistent with Grade I diastolic dysfunction (impaired relaxation).  Right Ventricle: The right ventricular size is normal. No increase in right ventricular wall thickness. Right ventricular systolic function is normal. There is normal pulmonary artery systolic pressure. The tricuspid regurgitant velocity is 2.09 m/s, and  with an assumed right atrial pressure of 5 mmHg, the estimated right ventricular systolic pressure is 82.9 mmHg.  Left Atrium: Left atrial size was normal in size.  Right Atrium: Right atrial size was normal in size.  Pericardium: There is no evidence of pericardial effusion.  Mitral Valve: The  mitral valve is normal in structure. No evidence of mitral valve regurgitation. No evidence of mitral valve stenosis. MV peak gradient, 5.9 mmHg. The mean mitral valve gradient is 2.0 mmHg.  Tricuspid Valve: The tricuspid valve is normal in structure. Tricuspid valve regurgitation is not demonstrated. No evidence of tricuspid stenosis.  Aortic Valve: The aortic valve was not well visualized. Aortic valve regurgitation is not visualized. No aortic stenosis is present. Aortic valve mean gradient measures 3.0 mmHg. Aortic valve peak gradient measures 5.0 mmHg. Aortic valve area, by VTI  measures 3.86 cm.  Pulmonic Valve: The pulmonic valve was normal in structure. Pulmonic valve regurgitation is not visualized. No evidence of pulmonic stenosis.  Aorta: The aortic root is normal in size and structure.  Venous: The inferior vena cava is normal in size with greater than 50% respiratory variability, suggesting right atrial pressure of 3 mmHg.  IAS/Shunts: No atrial level shunt detected by color flow Doppler.    LEFT VENTRICLE PLAX 2D LVIDd:         2.80 cm   Diastology LVIDs:         2.00 cm   LV e' medial:    4.03 cm/s LV PW:  1.00 cm   LV E/e' medial:  24.3 LV IVS:        1.50 cm   LV e' lateral:   5.66 cm/s LVOT diam:     2.00 cm   LV E/e' lateral: 17.3 LV SV:         56 LV SV Index:   32 LVOT Area:     3.14 cm    RIGHT VENTRICLE RV S prime:     11.50 cm/s  LEFT ATRIUM           Index        RIGHT ATRIUM           Index LA diam:      2.70 cm 1.54 cm/m   RA Area:     16.50 cm LA Vol (A2C): 15.2 ml 8.66 ml/m   RA Volume:   44.20 ml  25.18 ml/m LA Vol (A4C): 25.8 ml 14.70 ml/m  AORTIC VALVE AV Area (Vmax):    2.86 cm AV Area (Vmean):   2.98 cm AV Area (VTI):     3.86 cm AV Vmax:           112.00 cm/s AV Vmean:          78.200 cm/s AV VTI:            0.144 m AV Peak Grad:      5.0 mmHg AV Mean Grad:      3.0 mmHg LVOT Vmax:         102.00 cm/s LVOT Vmean:         74.200 cm/s LVOT VTI:          0.177 m LVOT/AV VTI ratio: 1.23  MITRAL VALVE               TRICUSPID VALVE MV Area (PHT): 7.02 cm    TR Peak grad:   17.5 mmHg MV Area VTI:   2.63 cm    TR Vmax:        209.00 cm/s MV Peak grad:  5.9 mmHg MV Mean grad:  2.0 mmHg    SHUNTS MV Vmax:       1.21 m/s    Systemic VTI:  0.18 m MV Vmean:      66.1 cm/s   Systemic Diam: 2.00 cm MV Decel Time: 108 msec MV E velocity: 98.10 cm/s MV A velocity: 77.10 cm/s MV E/A ratio:  1.27  Ida Rogue MD Electronically signed by Ida Rogue MD Signature Date/Time: 10/03/2021/12:32:19 PM      Final    Note: Reviewed        Physical Exam  General appearance: Well nourished, well developed, and well hydrated. In no apparent acute distress Mental status: Alert, oriented x 3 (person, place, & time)       Respiratory: No evidence of acute respiratory distress Eyes: PERLA Vitals: BP (!) 152/80   Pulse 65   Temp (!) 97.5 F (36.4 C)   Resp 16   Ht '5\' 6"'$  (1.676 m)   Wt 138 lb (62.6 kg)   SpO2 98%   BMI 22.27 kg/m  BMI: Estimated body mass index is 22.27 kg/m as calculated from the following:   Height as of this encounter: '5\' 6"'$  (1.676 m).   Weight as of this encounter: 138 lb (62.6 kg). Ideal: Ideal body weight: 59.3 kg (130 lb 11.7 oz) Adjusted ideal body weight: 60.6 kg (133 lb 10.2 oz)  Lumbar Spine Area Exam  Skin & Axial Inspection: No  masses, redness, or swelling Alignment: Symmetrical Functional ROM: Pain restricted ROM       Stability: No instability detected Muscle Tone/Strength: Functionally intact. No obvious neuro-muscular anomalies detected. Sensory (Neurological): Musculoskeletal pain pattern  Lower Extremity Exam    Side: Right lower extremity  Side: Left lower extremity  Stability: No instability observed          Stability: No instability observed          Skin & Extremity Inspection: Skin color, temperature, and hair growth are WNL. No peripheral edema or cyanosis. No  masses, redness, swelling, asymmetry, or associated skin lesions. No contractures.  Skin & Extremity Inspection: Skin color, temperature, and hair growth are WNL. No peripheral edema or cyanosis. No masses, redness, swelling, asymmetry, or associated skin lesions. No contractures.  Functional ROM: Diminished ROM of the right hip joint, right SI joint                  Functional ROM: Unrestricted ROM                  Muscle Tone/Strength: Functionally intact. No obvious neuro-muscular anomalies detected.  Muscle Tone/Strength: Functionally intact. No obvious neuro-muscular anomalies detected.  Sensory (Neurological): Musculoskeletal pain pattern        Sensory (Neurological): Unimpaired        DTR: Patellar: deferred today Achilles: deferred today Plantar: deferred today  DTR: Patellar: deferred today Achilles: deferred today Plantar: deferred today  Palpation: No palpable anomalies  Palpation: No palpable anomalies    Assessment   Status Diagnosis  Persistent Controlled Controlled 1. Peripheral polyneuropathy   2. Lumbar degenerative disc disease   3. Tobacco abuse   4. Osteoarthritis of cervical spine, unspecified spinal osteoarthritis complication status   5. Chronic pain syndrome       Plan of Care   [name] has a current medication list which includes the following long-term medication(s): albuterol, amitriptyline, carvedilol, enalapril, ezetimibe, furosemide, gabapentin, novolog flexpen, mirtazapine, potassium chloride, rosuvastatin, and trazodone.  Pharmacotherapy (Medications Ordered): Meds ordered this encounter  Medications   gabapentin (NEURONTIN) 600 MG tablet    Sig: Take 1 tablet (600 mg total) by mouth every 12 (twelve) hours.    Dispense:  60 tablet    Refill:  5    Fill one day early if pharmacy is closed on scheduled refill date. May substitute for generic if available.    Smoking cessation instruction/counseling given:  counseled patient on the  dangers of tobacco use, advised patient to stop smoking, and reviewed strategies to maximize success  Follow-up plan:   Return in about 6 months (around 02/04/2023) for Medication Management, in person.   Recent Visits No visits were found meeting these conditions. Showing recent visits within past 90 days and meeting all other requirements Today's Visits Date Type Provider Dept  08/06/22 Office Visit Sarah Santa, MD [facility]  Showing today's visits and meeting all other requirements Future Appointments No visits were found meeting these conditions. Showing future appointments within next 90 days and meeting all other requirements  I discussed the assessment and treatment plan with the patient. The patient was provided an opportunity to ask questions and all were answered. The patient agreed with the plan and demonstrated an understanding of the instructions.  Patient advised to call back or seek an in-person evaluation if the symptoms or condition worsens.  Duration of encounter: 3mnutes.  Note by: BGillis Santa MD Date: 08/06/2022; Time: 2:37 PM

## 2022-08-28 ENCOUNTER — Other Ambulatory Visit
Admission: RE | Admit: 2022-08-28 | Discharge: 2022-08-28 | Disposition: A | Discharge status: Home / Self Care | Payer: Medicare PPO | Source: Ambulatory Visit | Attending: Physician Assistant | Admitting: Physician Assistant

## 2022-08-28 ENCOUNTER — Ambulatory Visit: Payer: Medicare PPO | Attending: Physician Assistant | Admitting: Physician Assistant

## 2022-08-28 ENCOUNTER — Encounter: Payer: Self-pay | Admitting: Physician Assistant

## 2022-08-28 VITALS — BP 130/80 | HR 77 | Ht 66.5 in | Wt 133.8 lb

## 2022-08-28 DIAGNOSIS — Z720 Tobacco use: Secondary | ICD-10-CM | POA: Diagnosis not present

## 2022-08-28 DIAGNOSIS — E785 Hyperlipidemia, unspecified: Secondary | ICD-10-CM | POA: Diagnosis not present

## 2022-08-28 DIAGNOSIS — I2510 Atherosclerotic heart disease of native coronary artery without angina pectoris: Secondary | ICD-10-CM | POA: Insufficient documentation

## 2022-08-28 DIAGNOSIS — I447 Left bundle-branch block, unspecified: Secondary | ICD-10-CM

## 2022-08-28 DIAGNOSIS — I10 Essential (primary) hypertension: Secondary | ICD-10-CM | POA: Diagnosis not present

## 2022-08-28 DIAGNOSIS — I5181 Takotsubo syndrome: Secondary | ICD-10-CM

## 2022-08-28 DIAGNOSIS — I779 Disorder of arteries and arterioles, unspecified: Secondary | ICD-10-CM | POA: Diagnosis not present

## 2022-08-28 DIAGNOSIS — R55 Syncope and collapse: Secondary | ICD-10-CM | POA: Insufficient documentation

## 2022-08-28 DIAGNOSIS — I739 Peripheral vascular disease, unspecified: Secondary | ICD-10-CM | POA: Insufficient documentation

## 2022-08-28 DIAGNOSIS — R42 Dizziness and giddiness: Secondary | ICD-10-CM

## 2022-08-28 DIAGNOSIS — I5022 Chronic systolic (congestive) heart failure: Secondary | ICD-10-CM | POA: Diagnosis not present

## 2022-08-28 LAB — COMPREHENSIVE METABOLIC PANEL
ALT: 24 U/L (ref 0–44)
AST: 29 U/L (ref 15–41)
Albumin: 4.4 g/dL (ref 3.5–5.0)
Alkaline Phosphatase: 81 U/L (ref 38–126)
Anion gap: 10 (ref 5–15)
BUN: 28 mg/dL — ABNORMAL HIGH (ref 8–23)
CO2: 28 mmol/L (ref 22–32)
Calcium: 9.4 mg/dL (ref 8.9–10.3)
Chloride: 94 mmol/L — ABNORMAL LOW (ref 98–111)
Creatinine, Ser: 1.95 mg/dL — ABNORMAL HIGH (ref 0.44–1.00)
GFR, Estimated: 27 mL/min — ABNORMAL LOW (ref >60)
Glucose, Bld: 301 mg/dL — ABNORMAL HIGH (ref 70–99)
Potassium: 4 mmol/L (ref 3.5–5.1)
Sodium: 132 mmol/L — ABNORMAL LOW (ref 135–145)
Total Bilirubin: 0.7 mg/dL (ref 0.3–1.2)
Total Protein: 7.9 g/dL (ref 6.5–8.1)

## 2022-08-28 LAB — LIPID PANEL
Cholesterol: 117 mg/dL (ref 0–200)
HDL: 42 mg/dL (ref >40)
LDL Cholesterol: 32 mg/dL (ref 0–99)
Total CHOL/HDL Ratio: 2.8 RATIO
Triglycerides: 213 mg/dL — ABNORMAL HIGH (ref <150)
VLDL: 43 mg/dL — ABNORMAL HIGH (ref 0–40)

## 2022-08-28 LAB — LDL CHOLESTEROL, DIRECT: Direct LDL: 52 mg/dL (ref 0–99)

## 2022-08-28 NOTE — Patient Instructions (Signed)
Medication Instructions:  No changes at this time  *If you need a refill on your cardiac medications before your next appointment, please call your pharmacy*   Lab Work: Lipid, Direct LDL & CMP today over at the [facility]. Check in at registration desk.    If you have labs (blood work) drawn today and your tests are completely normal, you will receive your results only by: [place] (if you have MyChart) OR A paper copy in the mail If you have any lab test that is abnormal or we need to change your treatment, we will call you to review the results.   Testing/Procedures: Your physician has requested that you have an echocardiogram. Echocardiography is a painless test that uses sound waves to create images of your heart. It provides your doctor with information about the size and shape of your heart and how well your heart's chambers and valves are working. This procedure takes approximately one hour. There are no restrictions for this procedure. Please do NOT wear cologne, perfume, aftershave, or lotions (deodorant is allowed). Please arrive 15 minutes prior to your appointment time.  Your physician has requested that you have a carotid duplex. This test is an ultrasound of the carotid arteries in your neck. It looks at blood flow through these arteries that supply the brain with blood. Allow one hour for this exam. There are no restrictions or special instructions.  Your physician has requested that you have an ankle brachial index (ABI). During this test an ultrasound and blood pressure cuff are used to evaluate the arteries that supply the arms and legs with blood. Allow thirty minutes for this exam. There are no restrictions or special instructions.  Your physician has requested that you have a lower extremity arterial exercise duplex. During this test, exercise and ultrasound are used to evaluate arterial blood flow in the legs. Allow one hour for this exam. There are no  restrictions or special instructions.    Follow-Up: At [facility], you and your health needs are our priority.  As part of our continuing mission to provide you with exceptional heart care, we have created designated Provider Care Teams.  These Care Teams include your primary Cardiologist (physician) and Advanced Practice Providers (APPs -  Physician Assistants and Nurse Practitioners) who all work together to provide you with the care you need, when you need it.  Your next appointment:   6 month(s)  Provider:   Kathlyn [place], MD Only

## 2022-08-28 NOTE — Progress Notes (Signed)
Cardiology Office Note    Date:  08/28/2022   ID:  Sarah, Payne 10/20/1945, MRN [id number]  PCP:  Sarah Koch, NP  Cardiologist:  Sarah Rogue, MD  Electrophysiologist:  None   Chief Complaint: Follow-up  History of Present Illness:   Sarah Payne is a 77 y.o. female with history of CAD PCI/DES to the distal RCA in 08/2011, mid LCx in 09/2011, and PCI/DES x2 to the RCA in 07/2012, HFimpEF, PAD status post orbital atherectomy and drug-coated balloon angioplasty to the left SFA in 08/2020, DM2, CKD stage IIIb, intermittent LBBB, HTN, HLD, COPD, hypothyroidism, orthostatic dizziness versus vertigo, and sleep apnea who presents for follow-up of her CAD.   LHC in 07/2012 showed moderate mid LAD disease at the takeoff of a diagonal vessel, severe ostial to mid RCA disease, and an EF of 55%.  FFR indicated severe disease involving the RCA which was treated successfully with PCI/DES x2.  She was admitted to the hospital on 12/2015 with a stress-induced cardiomyopathy with echo at that time showing an EF of 25 to 30% with wall motion abnormality concerning for Takotsubo, grade 1 diastolic dysfunction.  LHC in 12/2015 showed patent stents.  Follow-up echo in 03/2016 showed subsequent normalization of LV systolic function with an EF of 60 to 65%, normal wall motion, and grade 1 diastolic dysfunction.   Cardiac monitoring in 03/2021 showed a predominant rhythm of sinus with an average rate of 80 bpm, 3 episodes of SVT with the longest lasting 7 beats, and rare atrial and ventricular ectopy.  No significant arrhythmias to explain symptoms of syncope.  Echo in 03/2021 demonstrated an EF of 55%, no regional wall motion abnormalities, mild LVH, grade 1 diastolic dysfunction, normal RV systolic function and ventricular cavity size, aortic valve sclerosis without evidence of stenosis, and an estimated right atrial pressure of 3 mmHg.  Carotid artery ultrasound in 03/2021 demonstrated 1 to 39% bilateral ICA  stenoses with antegrade flow of the bilateral vertebral arteries and normal flow hemodynamics in the bilateral subclavian arteries.   She underwent shoulder arthroplasty in 09/2021 with postoperative course notable for acute onset of shortness of breath and lethargy.  She was noted to be hyperkalemic with a potassium of 5.9 which improved with treatment.  Chest x-ray showed mild pulmonary edema and mild bilateral pleural effusions.  Symptoms improved with diuresis.  Echo from 09/2021 demonstrated an EF of 55 to 60%, wall motion abnormality possibly secondary to LBBB, grade 1 diastolic dysfunction, normal RV systolic function and ventricular cavity size, normal PASP, no significant valvular abnormalities, and an estimated right atrial pressure of 3 mmHg.  It was recommend spironolactone be held due to hyperkalemia.  Imdur and meclizine were also discontinued at discharge.  She was last seen in the office in 01/2022 and was without symptoms of angina or decompensation.  She continued to note intermittent dizzy spells, though these were improved when compared to prior visits.  Historically, it has been felt these dizzy episodes have been related to orthostatic hypotension versus vertigo.  She was without frank syncope.  She was sedentary at baseline, spending the majority of her day sitting in her living room, watching TV and smoking.  With regards to ABI and lower extremity arterial ultrasound were ordered, though remain pending at this time.  She comes in today and is without symptoms of angina or cardiac decompensation.  No dyspnea, palpitations, presyncope, or syncope.  She does continue to note intermittent dizziness that is described  as a room spinning sensation and is worse with sudden positional changes.  She is also under increased stress at home surrounding a disagreement with her daughter in [place].  With this, the patient is emotional.  She is quite depressed.  No SI or HI.  She does continue to note  lower extremity fatigue/weakness, and neuropathy from the knees down.  No nonhealing wounds.   Labs independently reviewed: 07/2022 - A1c 8.4 02/2022 - TSH normal 09/2021 - magnesium 1.6, HGB 12.1, PLT 196, BUN 39, SCr 1.64, potassium 4.3 06/2021 - TC 112, TG 182, HDL 36, LDL 39  Past Medical History:  Diagnosis Date   Aortic atherosclerosis ([place])    Bilateral cataracts 01/2015   a.) s/p extraction   CHF (congestive heart failure) ([place]) 09/16/2013   a.) TTE 09/16/2013: EF 55-60%; mod inferior HK, triv TR; G1DD. b.) TTE 01/02/2016: EF 25-30%; mid-apicalateroseptal, lat, inf, inferoseptal, apical akinesis; triv TR; G1DD. c. TTE 04/16/2016: EF 65%; G1DD. d.) TTE 05/02/2020: EF 50-55%; G2DD. e.) TTE 04/11/2021: EF 55%; G1DD.   Chronic low back pain    Clostridioides difficile infection 2015   COPD (chronic obstructive pulmonary disease) (HCC)    Coronary artery disease    a.) PCI 02/06/2011: 100% mLCx - DES x 3. b.) PCI 09/17/2011: 95% dRCA - DES x 1. c.) PCI 10/02/2011: 75% pLCX - DES x 1. d.) PCI 08/19/2012: DES x 2 p-mRCA.  e.) [place] 01/01/16: Takotsubo event 01/01/2016 with patent stents   Depression    Diverticulosis    Frequent PVCs    a. Noted in hospital 12/2015.   GERD (gastroesophageal reflux disease)    History of heart artery stent    a.) TOTAL of 7 stents: a.) 02/06/2011 - overlapping 2.5x12 mm Xience V, 2.5x8 mm Xience V, 2.25x8 mm Xience Nano to mLCx. b.) 09/17/2011 - 2.5x23 Xience V dRCA. c.) 10/02/2011 - 2.5x15 mm Xience V pLCx. d.) 08/09/2012 - overlapping 3.0x71m mRCA and 3.5x292mpRCA   Hyperlipidemia    Hypertension    Hypothyroidism    Impingement syndrome of left shoulder    LBBB (left bundle branch block)    Long term current use of antithrombotics/antiplatelets    a.) clopidogrel   Marijuana abuse    [facility]   a.) multiple MIs;  5 per her report   OSA (obstructive sleep apnea)    a.) mild; does not require nocturnal PAP therapy   Paroxysmal  SVT (supraventricular tachycardia) 05/08/2021   a.)  Zio patch 05/08/2021: 3 distinct SVT runs; fastest 5 beats at a rate of 146 bpm; longest 7 beats at rate of 134 bpm   [place]   Takotsubo cardiomyopathy    a. 12/2015 - nephew committed suicide 1 week prior, sister died the morning of presentation - initially called a STEMI; cath with patent stents. LVEF 25-30%.   Tendonitis of left rotator cuff    Tobacco abuse    Vascular dementia     Past Surgical History:  Procedure Laterality Date   ABDOMINAL AORTOGRAM W/LOWER EXTREMITY N/A 09/12/2020   Procedure: ABDOMINAL AORTOGRAM W/LOWER EXTREMITY;  Surgeon: ArWellington HampshireMD;  [facility];  Service: Cardiovascular;  Laterality: N/A;   ABDOMINAL HYSTERECTOMY     APPENDECTOMY     BACK SURGERY     CARDIAC CATHETERIZATION N/A 01/01/2016   Procedure: Left Heart Cath and Coronary Angiography;  Surgeon: JaJettie BoozeMD;  [facility];  Service: Cardiovascular;  Laterality: N/A;   CATARACT EXTRACTION W/PHACO Left 01/30/2015   Procedure: CATARACT EXTRACTION PHACO AND INTRAOCULAR LENS PLACEMENT (IOC);  Surgeon: Birder Robson, MD;  Location: [facility];  Service: Ophthalmology;  Laterality: Left;  [place] 00:47    CATARACT EXTRACTION W/PHACO Right 02/13/2015   Procedure: CATARACT EXTRACTION PHACO AND INTRAOCULAR LENS PLACEMENT (IOC);  Surgeon: Birder Robson, MD;  Location: [facility];  Service: Ophthalmology;  Laterality: Right;  cassette lot # [id number] H [place]  00:29.9 AP  20.7 CDE  6.20   COLONOSCOPY N/A 11/02/2014   Procedure: COLONOSCOPY;  Surgeon: Inda Castle, MD;  Location: [place];  Service: Endoscopy;  Laterality: N/A;   CORONARY ANGIOPLASTY WITH STENT PLACEMENT Left 02/06/2011   Procedure: CORONARY ANGIOPLASTY WITH STENT PLACEMENT; Location: [place]; Surgeon: Katrine Coho, MD   CORONARY ANGIOPLASTY WITH STENT PLACEMENT Left 09/17/2011   Procedure: CORONARY ANGIOPLASTY  WITH STENT PLACEMENT; Location: [place]; Surgeon: Katrine Coho, MD   CORONARY ANGIOPLASTY WITH STENT PLACEMENT Left 10/02/2011   Procedure: CORONARY ANGIOPLASTY WITH STENT PLACEMENT; Location: [place]; Surgeon: Katrine Coho, MD   CORONARY ANGIOPLASTY WITH STENT PLACEMENT Left 08/19/2012   Procedure: CORONARY ANGIOPLASTY WITH STENT PLACEMENT; Location: [place]; Surgeon: Kathlyn Sacramento, MD   ESOPHAGOGASTRODUODENOSCOPY (EGD) WITH PROPOFOL N/A 09/21/2018   Procedure: ESOPHAGOGASTRODUODENOSCOPY (EGD) WITH PROPOFOL;  Surgeon: [name], MD;  [facility];  Service: Gastroenterology;  Laterality: N/A;   LEFT HEART CATH AND CORONARY ANGIOGRAPHY Left 08/02/2014   Procedure: LEFT HEART CATH AND CORONARY ANGIOGRAPHY; Location: [facility]; Surgeon: Glenetta Hew, MD   PERIPHERAL VASCULAR ATHERECTOMY Left 09/12/2020   Procedure: PERIPHERAL VASCULAR ATHERECTOMY;  Surgeon: Wellington Hampshire, MD;  Location: [place] [facility];  Service: Cardiovascular;  Laterality: Left;   PERIPHERAL VASCULAR BALLOON ANGIOPLASTY  09/12/2020   Procedure: PERIPHERAL VASCULAR BALLOON ANGIOPLASTY;  Surgeon: Wellington Hampshire, MD;  Location: [place] [facility] LAB;  Service: Cardiovascular;;   REVERSE SHOULDER ARTHROPLASTY Left 10/01/2021   Procedure: REVERSE SHOULDER ARTHROPLASTY WITH BICEPS TENODESIS.;  Surgeon: Corky Mull, MD;  Location: [facility];  Service: Orthopedics;  Laterality: Left;   SHOULDER SURGERY Left 2017    Current Medications: Current Meds  Medication Sig   albuterol (VENTOLIN HFA) 108 (90 Base) MCG/ACT inhaler Inhale 2 puffs into the lungs every 6 (six) hours as needed for wheezing or shortness of breath.   amitriptyline (ELAVIL) 50 MG tablet Take 1 tablet (50 mg total) by mouth at bedtime as needed for sleep.   carvedilol (COREG) 3.125 MG tablet Take 1 tablet (3.125 mg total) by mouth 2 (two) times daily.   clopidogrel (PLAVIX) 75 MG tablet Take 1 tablet (75 mg total) by mouth daily.   Continuous  Blood Gluc Receiver (FREESTYLE LIBRE 2 READER) DEVI Use with sensor to check blood sugars 6 times daily.   enalapril (VASOTEC) 10 MG tablet Take 1 tablet (10 mg total) by mouth daily.   ezetimibe (ZETIA) 10 MG tablet Take 1 tablet (10 mg total) by mouth daily. For cholesterol.   furosemide (LASIX) 20 MG tablet Take one tab only on Monday, Wednesday, & Fridays   gabapentin (NEURONTIN) 600 MG tablet Take 1 tablet (600 mg total) by mouth every 12 (twelve) hours.   glucose blood (FREESTYLE LITE) test strip Test up to four times a day as needed. DX   insulin aspart (NOVOLOG FLEXPEN) 100 UNIT/ML FlexPen Inject 18 units into the skin with breakfast, 14 units with lunch, and 16 units with dinner for diabetes.   Insulin Pen Needle (INSUPEN PEN  NEEDLES) 32G X 4 MM MISC Use to inject insulin 4 times daily.   levothyroxine (SYNTHROID) 100 MCG tablet Take 100 mcg by mouth daily before breakfast.   mirtazapine (REMERON) 7.5 MG tablet Take 1 tablet (7.5 mg total) by mouth at bedtime. For appetite and sleep.   ondansetron (ZOFRAN) 4 MG tablet Take 1 tablet (4 mg total) by mouth every 6 (six) hours as needed for nausea.   potassium chloride (KLOR-CON M) 10 MEQ tablet Only take on days you take your lasix. Take 1 tablet (10 mEq total) by mouth for 1 dose on those days you take your lasix '20mg'$  pill.   rosuvastatin (CRESTOR) 40 MG tablet Take 1 tablet (40 mg total) by mouth every evening. For cholesterol.   traZODone (DESYREL) 100 MG tablet TAKE 1 TABLET BY MOUTH AT BEDTIME AS NEEDED FOR SLEEP    Allergies:   Patient has no known allergies.   Social History   Socioeconomic History   Marital status: Married    Spouse name: Not on file   Number of children: Not on file   Years of education: Not on file   Highest education level: Not on file  Occupational History   Not on file  Tobacco Use   Smoking status: Every Day    Packs/day: 1.00    Years: 45.00    Total pack years: 45.00    Types: Cigarettes    Smokeless tobacco: Never   Tobacco comments:    Has cut back, trying to quit.   Vaping Use   Vaping Use: Never used  Substance and Sexual Activity   Alcohol use: No    Alcohol/week: 0.0 standard drinks of alcohol   Drug use: Yes    Types: Marijuana    Comment: last noc   Sexual activity: Not on file  Other Topics Concern   Not on file  Social History Narrative   Lives at home with her husband in [place].  Previously used marijuana - quit.      Regular exercise: no/ pain from a frozen rotator cuff   Caffeine use: coffee daily and pepsi      Does not have a living will.   Daughters and husband know her wishes- would desire CPR but not prolonged life support if futile   Social Determinants of Health   Financial Resource Strain: Low Risk  (08/06/2022)   Overall Financial Resource Strain (CARDIA)    Difficulty of Paying Living Expenses: Not hard at all  Food Insecurity: No Food Insecurity (08/06/2022)   Hunger Vital Sign    Worried About Running Out of Food in the Last Year: Never true    Ran Out of Food in the Last Year: Never true  Transportation Needs: No Transportation Needs (08/06/2022)   PRAPARE - Hydrologist (Medical): No    Lack of Transportation (Non-Medical): No  Physical Activity: Inactive (08/06/2022)   Exercise Vital Sign    Days of Exercise per Week: 0 days    Minutes of Exercise per Session: 0 min  Stress: No Stress Concern Present (08/06/2022)   [place]    Feeling of Stress : Only a little  Social Connections: Socially Isolated (08/06/2022)   Social Connection and Isolation Panel [NHANES]    Frequency of Communication with Friends and Family: Never    Frequency of Social Gatherings with Friends and Family: Never    Attends Religious Services: Never    Active  Member of Clubs or Organizations: No    Attends Music therapist: Never    Marital Status:  Married     Family History:  The patient's family history includes Colon cancer in her sister; Heart attack in her mother; Heart disease in her father and mother; Liver cancer in her brother; Throat cancer in her brother.  ROS:   12 point review of systems is negative unless otherwise noted in HPI.   EKGs/Labs/Other Studies Reviewed:    Studies reviewed were summarized above. The additional studies were reviewed today:  2D echo 10/03/2021: 1. Challenging images   2. Left ventricular ejection fraction, by estimation, is 55 to 60%. The  left ventricle has normal function. Septal/anteroseptal wall motion  abnormality possibly secondary to LBBB. Left ventricular diastolic  parameters are consistent with Grade I  diastolic dysfunction (impaired relaxation).   3. Right ventricular systolic function is normal. The right ventricular  size is normal. There is normal pulmonary artery systolic pressure. The  estimated right ventricular systolic pressure is 16.3 mmHg.   4. The mitral valve is normal in structure. No evidence of mitral valve  regurgitation. No evidence of mitral stenosis.   5. The aortic valve was not well visualized. Aortic valve regurgitation  is not visualized. No aortic stenosis is present.   6. The inferior vena cava is normal in size with greater than 50%  respiratory variability, suggesting right atrial pressure of 3 mmHg. __________   [place] Reach patch 03/2021: Normal sinus rhythm Patient had a min HR of 53 bpm, max HR of 146 bpm, and avg HR of 80 bpm.    Bundle Branch Block/IVCD was present.  3 Supraventricular Tachycardia runs occurred, the run with the fastest interval lasting 5 beats with a max rate of 146 bpm, the longest lasting 7 beats with an avg rate of 134 bpm.    Isolated SVEs were rare (<1.0%), SVE Couplets were rare (<1.0%), and SVE Triplets were rare (<1.0%).  Isolated VEs were rare (<1.0%), VE Couplets were rare (<1.0%), and no VE Triplets were present.  Ventricular Trigeminy was present.   Rare patient triggered events associated with ectopy __________   2D echo 04/11/2021:  1. Left ventricular ejection fraction, by estimation, is 55%. Left  ventricular ejection fraction by 2D MOD biplane is 54.8 %. The left  ventricle has normal function. The left ventricle has no regional wall  motion abnormalities. There is mild left  ventricular hypertrophy. Left ventricular diastolic parameters are  consistent with Grade I diastolic dysfunction (impaired relaxation).   2. Right ventricular systolic function is normal. The right ventricular  size is normal.   3. The mitral valve was not well visualized. No evidence of mitral valve  regurgitation.   4. The aortic valve is tricuspid. Aortic valve regurgitation is not  visualized. Mild aortic valve sclerosis is present, with no evidence of  aortic valve stenosis.   5. The inferior vena cava is normal in size with greater than 50%  respiratory variability, suggesting right atrial pressure of 3 mmHg. __________   Carotid artery ultrasound 04/11/2021: Summary:  Right Carotid: Velocities in the right ICA are consistent with a 1-39%  stenosis. Non-hemodynamically significant plaque <50% noted in the CCA. The ECA appears 50% stenosed.   Left Carotid: Velocities in the left ICA are consistent with a 1-39%  stenosis. Non-hemodynamically significant plaque <50% noted in the  CCA. The ECA appears <50% stenosed.   Vertebrals:  Bilateral vertebral arteries demonstrate antegrade flow.  Subclavians:  Normal flow hemodynamics were seen in bilateral subclavian arteries. ___________   Lower extremity arterial ultrasound 09/17/2020: Left:  Marked improvement is noted compared to previous study.  Atherosclerosis in the common femoral, femoral and popliteal arteries.  No signficiant stenosis in the SFA, s/p atherectomy.   Suggest follow up study in 6 months. __________   ABI  09/17/2020:  +-------+-----------+-----------+------------+------------+  ABI/TBIToday's ABIToday's TBIPrevious ABIPrevious TBI  +-------+-----------+-----------+------------+------------+  Right  1.04       0.80       1.08        0.67          +-------+-----------+-----------+------------+------------+  Left   1.08       0.66       0.63        absent        +-------+-----------+-----------+------------+------------+    Right ABIs appear essentially unchanged compared to prior study on 08/2020.  Left ABIs and TBIs appear increased compared to prior study on 08/2020.     Summary:  Right: Resting right ankle-brachial index is within normal range. No  evidence of significant right lower extremity arterial disease. The right  toe-brachial index is normal.   Left: Resting left ankle-brachial index is within normal range. No  evidence of significant left lower extremity arterial disease. The left  toe-brachial index is abnormal. __________   Abdominal aortogram with lower extremity 09/12/2020: 1.  No significant aortoiliac disease. 2.  Left lower extremity: Severe calcified disease in the distal SFA with two-vessel runoff below the knee with an occluded anterior tibial artery.  The posterior tibial artery has moderate diffuse disease in the mid to distal segment. 3.  Successful orbital atherectomy and drug-coated balloon angioplasty to the left SFA.   Recommendations: Continue dual antiplatelet therapy. Aggressive treatment of risk factors. __________   [facility] 01/01/2016: Patent stents in the circumflex and RCA. There is severe left ventricular systolic dysfunction in a pattern of Takotsubo cardiomyopathy.   Medical therapy.    EKG:  EKG is ordered today.  The EKG ordered today demonstrates NSR, 77 bpm, first-degree AV block, LBBB (known)  Recent Labs: 10/02/2021: B Natriuretic Peptide 265.6 10/03/2021: Hemoglobin 12.1; Magnesium 1.6; Platelets 196 02/18/2022: TSH  3.46 08/28/2022: ALT 24; BUN 28; Creatinine, Ser 1.95; Potassium 4.0; Sodium 132  Recent Lipid Panel    Component Value Date/Time   CHOL 117 08/28/2022 1509   CHOL 200 (H) 08/29/2019 0947   CHOL 292 (H) 08/02/2014 0408   TRIG 213 (H) 08/28/2022 1509   TRIG 388 (H) 08/02/2014 0408   HDL 42 08/28/2022 1509   HDL 34 (L) 08/29/2019 0947   HDL 34 (L) 08/02/2014 0408   CHOLHDL 2.8 08/28/2022 1509   VLDL 43 (H) 08/28/2022 1509   VLDL 78 (H) 08/02/2014 0408   LDLCALC 32 08/28/2022 1509   LDLCALC 131 (H) 08/29/2019 0947   LDLCALC 180 (H) 08/02/2014 0408   LDLDIRECT 143.0 12/24/2018 1227    PHYSICAL EXAM:    VS:  BP 130/80 (BP Location: Left Arm, Patient Position: Sitting, Cuff Size: Normal)   Pulse 77   Ht 5' 6.5" (1.689 m)   Wt 133 lb 12.8 oz (60.7 kg)   SpO2 94%   BMI 21.27 kg/m   BMI: Body mass index is 21.27 kg/m.  Physical Exam Vitals reviewed.  Constitutional:      Appearance: She is well-developed.  HENT:     Head: Normocephalic and atraumatic.  Eyes:     General:  Right eye: No discharge.        Left eye: No discharge.  Neck:     Vascular: No JVD.  Cardiovascular:     Rate and Rhythm: Normal rate and regular rhythm.     Pulses:          Posterior tibial pulses are 1+ on the right side and 1+ on the left side.     Heart sounds: Normal heart sounds, S1 normal and S2 normal. Heart sounds not distant. No midsystolic click and no opening snap. No murmur heard.    No friction rub.  Pulmonary:     Effort: Pulmonary effort is normal. No respiratory distress.     Breath sounds: Normal breath sounds. No decreased breath sounds, wheezing or rales.  Chest:     Chest wall: No tenderness.  Abdominal:     General: There is no distension.  Musculoskeletal:     Cervical back: Normal range of motion.     Right lower leg: No edema.     Left lower leg: No edema.  Skin:    General: Skin is warm and dry.     Nails: There is no clubbing.  Neurological:     Mental Status:  She is alert and oriented to person, place, and time.  Psychiatric:        Speech: Speech normal.        Behavior: Behavior normal.        Thought Content: Thought content normal.        Judgment: Judgment normal.     Wt Readings from Last 3 Encounters:  08/28/22 133 lb 12.8 oz (60.7 kg)  08/06/22 138 lb (62.6 kg)  08/06/22 138 lb (62.6 kg)    Orthostatic vital signs: Lying: 128/75, 74 bpm Sitting: 113/71, 73 bpm, dizzy Standing: 122/73, 79 bpm, dizzy Standing time 3 minutes: 118/75, 78 bpm  ASSESSMENT & PLAN:   CAD involving the native coronary arteries without angina: She is without symptoms concerning for angina or cardiac decompensation.  Continue aggressive risk factor modification and secondary prevention including clopidogrel, carvedilol, enalapril, ezetimibe, and rosuvastatin.  No indication for further ischemic testing at this time.  HFimpEF with history of stress-induced cardiomyopathy: She is euvolemic and well compensated on low-dose furosemide 20 mg, Monday, Wednesday, and Friday.  She has a history of Takotsubo cardiomyopathy in the setting of acute family stressors in 2017 with subsequent normalization of LV systolic function by echo.  Given significant family stressors at this time, we will repeat an echo to evaluate for recurrent stress-induced cardiomyopathy.  Check renal function and electrolytes.  No longer on MRA secondary to prior hyperkalemia.  With history of orthostasis, we will defer addition of SGLT2 inhibitor or ARNI.  HTN: Blood pressure is well-controlled in the office today.  She remains on carvedilol.  HLD: LDL 39 in 06/2021.  Update lipid panel and direct LDL.  Check LFT.  She remains on rosuvastatin and ezetimibe.  PAD: Status post intervention to the left FSA in 08/2020 with most recent imaging from 09/2020 showing an improved ABI to normal on the left side with a patent left SFA stent. She is overdue for follow-up imaging.  Walking program is  recommended.  She remains on clopidogrel, ezetimibe, and rosuvastatin.  Complete smoking cessation is recommended.  Schedule ABIs and LAA.  Follow-up with Dr. Fletcher Anon.  Intermittent LBBB: No frank syncope.  Obtain echo.  Carotid artery stenosis: Most recent ultrasound showed less than 40% bilateral ICA stenosis.  Update  carotid artery ultrasound.  She remains on clopidogrel, ezetimibe, and rosuvastatin.  Tobacco use: With increased stress at home, she has increased her tobacco use to 2 packs/day.  Complete cessation is encouraged.  She is not ready to quit at this time.  Dizziness: Consistent with vertigo.  Follow-up with PCP.   Disposition: F/u with Dr. Rockey Situ or an APP in 6 months.   Medication Adjustments/Labs and Tests Ordered: Current medicines are reviewed at length with the patient today.  Concerns regarding medicines are outlined above. Medication changes, Labs and Tests ordered today are summarized above and listed in the Patient Instructions accessible in Encounters.   Signed, Christell Faith, PA-C 08/28/2022 4:12 PM     [facility] [address] [place], [place] [address] [phone]

## 2022-08-29 ENCOUNTER — Telehealth: Payer: Self-pay | Admitting: *Deleted

## 2022-08-29 DIAGNOSIS — I5022 Chronic systolic (congestive) heart failure: Secondary | ICD-10-CM

## 2022-08-29 DIAGNOSIS — R42 Dizziness and giddiness: Secondary | ICD-10-CM

## 2022-08-29 DIAGNOSIS — I5181 Takotsubo syndrome: Secondary | ICD-10-CM

## 2022-08-29 DIAGNOSIS — I10 Essential (primary) hypertension: Secondary | ICD-10-CM

## 2022-08-29 DIAGNOSIS — I2510 Atherosclerotic heart disease of native coronary artery without angina pectoris: Secondary | ICD-10-CM

## 2022-08-29 MED ORDER — FUROSEMIDE 20 MG PO TABS: 20.0000 mg | ORAL_TABLET | ORAL | 3 refills | Status: DC | PRN

## 2022-08-29 NOTE — Telephone Encounter (Signed)
Reviewed results and recommendations with patient. Advised we would like repeat labs in 1-2 weeks over at the [facility]. She read back all instructions and verbalized understanding.

## 2022-08-29 NOTE — Telephone Encounter (Signed)
-----   Message from Rise Mu, PA-C sent at 08/28/2022  3:58 PM EST ----- Corrected sodium normal at 137 Blood glucose significantly elevated at 301 Renal function slightly worse than prior readings Liver function normal Triglycerides mildly elevated, though this was a nonfasting sample LDL well-controlled and at goal  Recommendations: -Schedule appointment with PCP for further management of uncontrolled diabetes -Hold furosemide for 2 days with new instructions to take furosemide 20 mg daily as needed for weight gain greater than 3 pounds overnight or increased shortness of breath -Take KCl only when you take furosemide -Repeat BMP in 1 to 2 weeks to ensure renal function has trended back towards her baseline

## 2022-09-02 ENCOUNTER — Ambulatory Visit (INDEPENDENT_AMBULATORY_CARE_PROVIDER_SITE_OTHER): Payer: Medicare PPO | Admitting: Primary Care

## 2022-09-02 ENCOUNTER — Encounter: Payer: Self-pay | Admitting: Primary Care

## 2022-09-02 VITALS — BP 118/62 | HR 70 | Temp 97.3°F | Ht 66.5 in | Wt 135.0 lb

## 2022-09-02 DIAGNOSIS — R42 Dizziness and giddiness: Secondary | ICD-10-CM | POA: Diagnosis not present

## 2022-09-02 DIAGNOSIS — E1151 Type 2 diabetes mellitus with diabetic peripheral angiopathy without gangrene: Secondary | ICD-10-CM

## 2022-09-02 NOTE — Assessment & Plan Note (Addendum)
Uncontrolled. Symptoms representative of vertigo.  Fortunately, her vomiting symptoms have resolved.   She will call ENT for follow up of uncontrolled vertigo.  She will update if she needs a new referral.   Given her history of diabetes and decrease appetite, offered GI referral to rule out gastroparesis. She declines at this time.  Repeat BMP pending.

## 2022-09-02 NOTE — Patient Instructions (Signed)
Stop by the lab prior to leaving today. I will notify you of your results once received.   Please call ENT and schedule an appointment as soon as possible. If you need a new referral, please call the office.   Continue Insulin and Glipizide for now.   It was a pleasure to see you today!

## 2022-09-02 NOTE — Progress Notes (Signed)
Subjective:    Patient ID: Sarah Payne, female    DOB: Feb 23, 1946, 77 y.o.   MRN: [id number]  Emesis  Associated symptoms include abdominal pain and dizziness. Pertinent negatives include no diarrhea or fever.    Sarah Payne is a very pleasant 77 y.o. female with a significant medical history including CKD, uncontrolled type 2 diabetes, CHF, COPD, Takosubo cardiomyopathy, CAD, hypothyroidism, diverticulosis, chronic back pain, peripheral neuropathy who presents today to discuss nausea and vomiting. She was also sent her by cardiology for further evaluation of her abnormal labs.  Symptom onset four days ago with a sensation of "feeling drunk" with head spinning dizziness, "my head feels dark". Over the last four days she's woken up with one episode of vomiting with generalized abdominal discomfort that lasts temporarily. Also with constipation. She is passing gas. She does not vomiting more than once daily. No diarrhea. She feels like her dizziness has triggered her vomiting as her vomiting occurs when she first gets out of bed and moves. Today she's feeling better than she has. She denies vomiting this morning and abdominal pain.  She was referred to ENT in January 2023, she never responded to the scheduler's phone calls and was not seen. She was referred again in March 2023, doesn't remember what came out of this visit, she does recall not mentioning her ongoing vertigo.  Evaluated by cardiology last week for follow up. SGLT2 inhibitor or ARNI were not added given history of orthostasis. Labs reviewed hyponatremia, hyperglycemia (glucose 301), progressing kidney disease with creatinine of 1.95 and GFR of 27, increased from baseline creatinine of 1.5-1.7. She was deferred to [place] for further management of diabetes.  Unfortunately, diabetes has been uncontrolled for years. Previously following with endocrinology but was non compliant to the prescribed regimen and dietary recommendations. Recent  A1C of 8.4 from January 2024. During this visit her glipizide was discontinued due to recurrent hypoglycemic episodes. Her insulin regimen was tweaked.   Today she mentions that she continues to take Glipizide as she forgot that she was supposed to discontinue.   Fasting readings have been 180's Post prandial readings have been: 120's-130's  Over the last four days glucose readings are in the 200's-300's. She's increased her Novolog to 32 units BID. She denies episodes of hypoglycemia.    Review of Systems  Constitutional:  Negative for fever.  Respiratory:  Negative for shortness of breath.   Gastrointestinal:  Positive for abdominal pain, constipation, nausea and vomiting. Negative for diarrhea.  Neurological:  Positive for dizziness and light-headedness.         Past Medical History:  Diagnosis Date   Aortic atherosclerosis ([place])    Bilateral cataracts 01/2015   a.) s/p extraction   CHF (congestive heart failure) ([place]) 09/16/2013   a.) TTE 09/16/2013: EF 55-60%; mod inferior HK, triv TR; G1DD. b.) TTE 01/02/2016: EF 25-30%; mid-apicalateroseptal, lat, inf, inferoseptal, apical akinesis; triv TR; G1DD. c. TTE 04/16/2016: EF 65%; G1DD. d.) TTE 05/02/2020: EF 50-55%; G2DD. e.) TTE 04/11/2021: EF 55%; G1DD.   Chronic low back pain    Clostridioides difficile infection 2015   COPD (chronic obstructive pulmonary disease) (HCC)    Coronary artery disease    a.) PCI 02/06/2011: 100% mLCx - DES x 3. b.) PCI 09/17/2011: 95% dRCA - DES x 1. c.) PCI 10/02/2011: 75% pLCX - DES x 1. d.) PCI 08/19/2012: DES x 2 p-mRCA.  e.) [place] 01/01/16: Takotsubo event 01/01/2016 with patent stents   Depression  Diverticulosis    Frequent PVCs    a. Noted in hospital 12/2015.   GERD (gastroesophageal reflux disease)    History of heart artery stent    a.) TOTAL of 7 stents: a.) 02/06/2011 - overlapping 2.5x12 mm Xience V, 2.5x8 mm Xience V, 2.25x8 mm Xience Nano to mLCx. b.) 09/17/2011 - 2.5x23 Xience V  dRCA. c.) 10/02/2011 - 2.5x15 mm Xience V pLCx. d.) 08/09/2012 - overlapping 3.0x35m mRCA and 3.5x246mpRCA   Hyperlipidemia    Hypertension    Hypothyroidism    Impingement syndrome of left shoulder    LBBB (left bundle branch block)    Long term current use of antithrombotics/antiplatelets    a.) clopidogrel   Marijuana abuse    [facility]   a.) multiple MIs;  5 per her report   OSA (obstructive sleep apnea)    a.) mild; does not require nocturnal PAP therapy   Paroxysmal SVT (supraventricular tachycardia) 05/08/2021   a.)  Zio patch 05/08/2021: 3 distinct SVT runs; fastest 5 beats at a rate of 146 bpm; longest 7 beats at rate of 134 bpm   [facility] ([facility] 2 diabetes mellitus) [place]   Takotsubo cardiomyopathy    a. 12/2015 - nephew committed suicide 1 week prior, sister died the morning of presentation - initially called a STEMI; cath with patent stents. LVEF 25-30%.   Tendonitis of left rotator cuff    Tobacco abuse    Vascular dementia     Social History   Socioeconomic History   Marital status: Married    Spouse name: Not on file   Number of children: Not on file   Years of education: Not on file   Highest education level: Not on file  Occupational History   Not on file  Tobacco Use   Smoking status: Every Day    Packs/day: 1.00    Years: 45.00    Total pack years: 45.00    Types: Cigarettes   Smokeless tobacco: Never   Tobacco comments:    Has cut back, trying to quit.   Vaping Use   Vaping Use: Never used  Substance and Sexual Activity   Alcohol use: No    Alcohol/week: 0.0 standard drinks of alcohol   Drug use: Yes    Types: Marijuana    Comment: last noc   Sexual activity: Not on file  Other Topics Concern   Not on file  Social History Narrative   [place] Previously used marijuana - quit.      Regular exercise: no/ pain from a frozen rotator cuff   Caffeine use: coffee daily and pepsi      Does not  have a living will.   Daughters and husband know her wishes- would desire CPR but not prolonged life support if futile   Social Determinants of Health   Financial Resource Strain: Low Risk  (08/06/2022)   Overall Financial Resource Strain (CARDIA)    Difficulty of Paying Living Expenses: Not hard at all  Food Insecurity: No Food Insecurity (08/06/2022)   Hunger Vital Sign    Worried About Running Out of Food in the Last Year: Never true    Ran Out of Food in the Last Year: Never true  Transportation Needs: No Transportation Needs (08/06/2022)   PRAPARE - TrHydrologistMedical): No    Lack of Transportation (Non-Medical): No  Physical Activity: Inactive (08/06/2022)   Exercise Vital  Sign    Days of Exercise per Week: 0 days    Minutes of Exercise per Session: 0 min  Stress: No Stress Concern Present (08/06/2022)   [place]    Feeling of Stress : Only a little  Social Connections: Socially Isolated (08/06/2022)   Social Connection and Isolation Panel [NHANES]    Frequency of Communication with Friends and Family: Never    Frequency of Social Gatherings with Friends and Family: Never    Attends Religious Services: Never    Marine scientist or Organizations: No    Attends Archivist Meetings: Never    Marital Status: Married  Human resources officer Violence: Not At Risk (08/06/2022)   Humiliation, Afraid, Rape, and Kick questionnaire    Fear of Current or Ex-Partner: No    Emotionally Abused: No    Physically Abused: No    Sexually Abused: No    Past Surgical History:  Procedure Laterality Date   ABDOMINAL AORTOGRAM W/LOWER EXTREMITY N/A 09/12/2020   Procedure: ABDOMINAL AORTOGRAM W/LOWER EXTREMITY;  Surgeon: Wellington Hampshire, MD;  Location: [facility];  Service: Cardiovascular;  Laterality: N/A;   ABDOMINAL HYSTERECTOMY     APPENDECTOMY     BACK SURGERY     CARDIAC  CATHETERIZATION N/A 01/01/2016   Procedure: Left Heart Cath and Coronary Angiography;  Surgeon: Jettie Booze, MD;  Location: [place] [facility];  Service: Cardiovascular;  Laterality: N/A;   CATARACT EXTRACTION W/PHACO Left 01/30/2015   Procedure: CATARACT EXTRACTION PHACO AND INTRAOCULAR LENS PLACEMENT (IOC);  Surgeon: Birder Robson, MD;  Location: [facility];  Service: Ophthalmology;  Laterality: Left;  [place] 00:47    CATARACT EXTRACTION W/PHACO Right 02/13/2015   Procedure: CATARACT EXTRACTION PHACO AND INTRAOCULAR LENS PLACEMENT (IOC);  Surgeon: Birder Robson, MD;  Location: [facility];  Service: Ophthalmology;  Laterality: Right;  cassette lot # [id number] H [id number]  00:29.9 AP  20.7 CDE  6.20   COLONOSCOPY N/A 11/02/2014   Procedure: COLONOSCOPY;  Surgeon: Inda Castle, MD;  Location: [facility];  Service: Endoscopy;  Laterality: N/A;   CORONARY ANGIOPLASTY WITH STENT PLACEMENT Left 02/06/2011   Procedure: CORONARY ANGIOPLASTY WITH STENT PLACEMENT; Location: [facility]; Surgeon: Katrine Coho, MD   CORONARY ANGIOPLASTY WITH STENT PLACEMENT Left 09/17/2011   Procedure: CORONARY ANGIOPLASTY WITH STENT PLACEMENT; Location: [facility]; Surgeon: Katrine Coho, MD   CORONARY ANGIOPLASTY WITH STENT PLACEMENT Left 10/02/2011   Procedure: CORONARY ANGIOPLASTY WITH STENT PLACEMENT; Location: [place]; Surgeon: Katrine Coho, MD   CORONARY ANGIOPLASTY WITH STENT PLACEMENT Left 08/19/2012   Procedure: CORONARY ANGIOPLASTY WITH STENT PLACEMENT; Location: [facility]; Surgeon: Kathlyn Sacramento, MD   ESOPHAGOGASTRODUODENOSCOPY (EGD) WITH PROPOFOL N/A 09/21/2018   Procedure: ESOPHAGOGASTRODUODENOSCOPY (EGD) WITH PROPOFOL;  Surgeon: [name], MD;  Location: [facility];  Service: Gastroenterology;  Laterality: N/A;   LEFT HEART CATH AND CORONARY ANGIOGRAPHY Left 08/02/2014   Procedure: LEFT HEART CATH AND CORONARY ANGIOGRAPHY; Location: [facility]; Surgeon: Glenetta Hew, MD   PERIPHERAL VASCULAR ATHERECTOMY Left  09/12/2020   Procedure: PERIPHERAL VASCULAR ATHERECTOMY;  Surgeon: Wellington Hampshire, MD;  Location: [facility];  Service: Cardiovascular;  Laterality: Left;   PERIPHERAL VASCULAR BALLOON ANGIOPLASTY  09/12/2020   Procedure: PERIPHERAL VASCULAR BALLOON ANGIOPLASTY;  Surgeon: Wellington Hampshire, MD;  Location: [facility];  Service: Cardiovascular;;   REVERSE SHOULDER ARTHROPLASTY Left 10/01/2021   Procedure: REVERSE SHOULDER ARTHROPLASTY WITH BICEPS TENODESIS.;  Surgeon: Corky Mull, MD;  Location: [facility];  Service: Orthopedics;  Laterality: Left;   SHOULDER SURGERY Left 2017    Family History  Problem Relation Age of Onset   Heart attack Mother        First MI @ 47 - Died @ 33   Heart disease Mother    Heart disease Father        Died @ 55   Throat cancer Brother    Liver cancer Brother    Colon cancer Sister     No Known Allergies  Current Outpatient Medications on File Prior to Visit  Medication Sig Dispense Refill   albuterol (VENTOLIN HFA) 108 (90 Base) MCG/ACT inhaler Inhale 2 puffs into the lungs every 6 (six) hours as needed for wheezing or shortness of breath. 8 g 0   carvedilol (COREG) 3.125 MG tablet Take 1 tablet (3.125 mg total) by mouth 2 (two) times daily. 180 tablet 3   clobetasol cream (TEMOVATE) [id number] % Apply 1 Application topically 2 (two) times daily as needed. For vaginal itching. 30 g 0   clopidogrel (PLAVIX) 75 MG tablet Take 1 tablet (75 mg total) by mouth daily. 90 tablet 3   Continuous Blood Gluc Receiver (FREESTYLE LIBRE 2 READER) DEVI Use with sensor to check blood sugars 6 times daily. 1 each 0   enalapril (VASOTEC) 10 MG tablet Take 1 tablet (10 mg total) by mouth daily. 90 tablet 3   ezetimibe (ZETIA) 10 MG tablet Take 1 tablet (10 mg total) by mouth daily. For cholesterol. 90 tablet 3   furosemide (LASIX) 20 MG tablet Take 1 tablet (20 mg total) by mouth as needed (As needed for weight gain greater than 3 pounds overnight or increased  shortness of breath.). Take one tab only on Monday, Wednesday, & Fridays 90 tablet 3   gabapentin (NEURONTIN) 600 MG tablet Take 1 tablet (600 mg total) by mouth every 12 (twelve) hours. 60 tablet 5   glipiZIDE (GLUCOTROL XL) 10 MG 24 hr tablet Take 10 mg by mouth daily with breakfast.     glucose blood (FREESTYLE LITE) test strip Test up to four times a day as needed. DX 100 each 0   insulin aspart (NOVOLOG FLEXPEN) 100 UNIT/ML FlexPen Inject 18 units into the skin with breakfast, 14 units with lunch, and 16 units with dinner for diabetes. 45 mL 0   Insulin Pen Needle (INSUPEN PEN NEEDLES) 32G X 4 MM MISC Use to inject insulin 4 times daily. 250 each 1   levothyroxine (SYNTHROID) 100 MCG tablet Take 100 mcg by mouth daily before breakfast.     mirtazapine (REMERON) 7.5 MG tablet Take 1 tablet (7.5 mg total) by mouth at bedtime. For appetite and sleep. 90 tablet 0   ondansetron (ZOFRAN) 4 MG tablet Take 1 tablet (4 mg total) by mouth every 6 (six) hours as needed for nausea. 30 tablet 0   potassium chloride (KLOR-CON M) 10 MEQ tablet Only take on days you take your lasix. Take 1 tablet (10 mEq total) by mouth for 1 dose on those days you take your lasix 23m pill. 90 tablet 3   rosuvastatin (CRESTOR) 40 MG tablet Take 1 tablet (40 mg total) by mouth every evening. For cholesterol. 90 tablet 3   traZODone (DESYREL) 100 MG tablet TAKE 1 TABLET BY MOUTH AT BEDTIME AS NEEDED FOR SLEEP 90 tablet 2   amitriptyline (ELAVIL) 50 MG tablet Take 1 tablet (50 mg total) by mouth at bedtime as needed for sleep. 30 tablet 5   Continuous Blood  Gluc Sensor (FREESTYLE LIBRE 2 SENSOR) MISC USE TO CHECK SUGARS 6 TIMES DAILY - CHANGE OUT EVERY 14 DAYS (Patient not taking: Reported on 09/02/2022) 6 each 1   insulin glargine (LANTUS) 100 UNIT/ML Solostar Pen Inject 8 Units into the skin at bedtime. (Patient not taking: Reported on 09/02/2022)     No current facility-administered medications on file prior to visit.    BP  118/62   Pulse 70   Temp (!) 97.3 F (36.3 C) (Temporal)   Ht 5' 6.5" (1.689 m)   Wt 135 lb (61.2 kg)   SpO2 98%   BMI 21.46 kg/m  Objective:   Physical Exam Cardiovascular:     Rate and Rhythm: Normal rate and regular rhythm.  Pulmonary:     Effort: Pulmonary effort is normal.     Breath sounds: Normal breath sounds.  Abdominal:     General: Bowel sounds are normal.     Palpations: Abdomen is soft.     Tenderness: There is no abdominal tenderness.  Musculoskeletal:     Cervical back: Neck supple.  Skin:    General: Skin is warm and dry.           Assessment & Plan:  Dizziness Assessment & Plan: Uncontrolled. Symptoms representative of vertigo.  Fortunately, her vomiting symptoms have resolved.   She will call ENT for follow up of uncontrolled vertigo.  She will update if she needs a new referral.   Given her history of diabetes and decrease appetite, offered GI referral to rule out gastroparesis. She declines at this time.  Repeat BMP pending.  Orders: -     CBC with Differential/Platelet -     Basic metabolic panel  Diabetes mellitus type 2 with peripheral artery disease ([place]) Assessment & Plan: Uncontrolled. Believe her high readings could be from her being sick this week.   Continue Novolog at 16 units in AM, reduce to 14 units in afternoon and 16 units in PM with reduction to 14 units if eating smaller meals.  Continue Glipizide XL 10 mg daily for now.  Not a candidate for GLP1 agonist given her already chronically declined appetite. Consider SGLT2 inhibitor if needed. Will need to consult with cardiology and closely monitor for volume depletion if we decide to pursue,   She will continue to monitor her glucose levels and update.   Follow up in April.   I evaluated patient, was consulted regarding treatment, and agree with assessment and plan per Tinnie Gens, RN, DNP student.   Allie Bossier, NP-C          [name], NP

## 2022-09-02 NOTE — Progress Notes (Signed)
Established Patient Office Visit  Subjective   Patient ID: Sarah Payne, female    DOB: Jun 24, 1946  Age: 77 y.o. MRN: [id number]  Chief Complaint  Patient presents with   Emesis    Throwing up once a day for the past 4 days    Emesis  Associated symptoms include abdominal pain, chills and dizziness. Pertinent negatives include no chest pain, diarrhea, fever or headaches.    Sarah Payne is a 77 year old female with past medical history of type 2 diabetes, hypothyroidism, stage 3b chronic kidney disease, diverticulosis presents today to discuss vomiting.  Symptom onset was four days ago. She reports feeling "drunk" without any alcohol She says that her head feels that its dark and feels that room is spinning. She vomits once a day in the morning and the consistency is all fluid, no blood, feels acidic. She feels nauseous all throughout the day with generalized abdominal pain. She has not had a bowel movement this week. She does not have an appetite. She did eat some oranges and was able to keep that down. She did not have an episode today. Overall, feels that the upset stomach is getting better. She has a history of constipation. She is able to pass gas. She remembers the symptoms that happened last year and it resolved on its own. It lasted for four months. She remembers passing out 5 times. She was referred to ENT in 2023  Current medications include: Glipizide 10 mg daily, Novolog 16 units three times a day.  She is checking her blood glucose 10 times daily and is getting readings of her fasting readings have been 180s and two hours after eating 110s-120s. However, his week fasting readings have been in the 350s. For three days, she gave herself 32 units of Novolog in the morning due to her readings. No hypoglycemic events. Last A1C: 8.4 in January.   Patient Active Problem List   Diagnosis Date Noted   Urinary frequency 02/18/2022   Dizziness 10/11/2021   Status post reverse total  shoulder replacement, left 10/04/2021   S/p reverse total shoulder arthroplasty 10/01/2021   S/P reverse total shoulder arthroplasty, right 10/01/2021   Sinus bradycardia 10/01/2021   Acute on chronic diastolic CHF (congestive heart failure) ([place]) 10/01/2021   Stage 3b chronic kidney disease (CKD) ([place]) 10/01/2021   Primary osteoarthritis of left shoulder 09/16/2021   Rotator cuff tendinitis, left 09/16/2021   Encounter for annual general medical examination with abnormal findings in adult 02/15/2021   Abdominal bloating 03/22/2019   Vaginal burning 03/22/2019   Acute pain of left shoulder 01/17/2019   Chronic musculoskeletal pain 11/15/2018   Atherosclerosis of native coronary artery of native heart with stable angina pectoris ([place]) 05/17/2018   Chronic neck pain 02/10/2018   Chronic pain of right upper extremity 02/10/2018   Cervical spondylosis 02/10/2018   Neck pain 01/19/2018   Right shoulder pain 01/19/2018   Chronic pain syndrome 01/19/2018   Lumbar degenerative disc disease 09/29/2017   Lumbar spondylosis with myelopathy 09/29/2017   Chronic bilateral back pain 05/19/2017   Insomnia 01/12/2017   Cannabis use disorder, mild, abuse 09/03/2016   Cognitive changes 09/03/2016   Takotsubo cardiomyopathy 01/02/2016   Frequent PVCs 01/02/2016   HTN (hypertension) 09/20/2015   Benign neoplasm of colon 11/02/2014   Internal hemorrhoids 11/02/2014   Adjustment disorder with mixed anxiety and depressed mood 08/16/2014   Orthostatic hypotension 08/08/2014   Esophageal reflux 05/23/2014   Diverticulosis of colon without hemorrhage  05/17/2014   Peripheral polyneuropathy 05/05/2014   Tobacco dependence 05/05/2014   COPD (chronic obstructive pulmonary disease) ([place]) 03/29/2013   Noncompliance with diabetes treatment 02/27/2013   Obstructive sleep apnea 99991111   Chronic systolic congestive heart failure ([place]) 99991111   Lichen sclerosus et atrophicus 05/20/2012   Bronchitis,  chronic obstructive ([place]) 05/20/2012   Diabetes mellitus type 2 with peripheral artery disease ([place]) 08/28/2011   History of lumbar fusion 06/29/2011   Depression 06/24/2011   Hypothyroidism 05/03/2011   CAD, multiple vessel 02/24/2011   Tobacco abuse 02/24/2011   Mixed hyperlipidemia 02/24/2011   Postural dizziness with near syncope 02/24/2011   Past Medical History:  Diagnosis Date   Aortic atherosclerosis ([place])    Bilateral cataracts 01/2015   a.) s/p extraction   CHF (congestive heart failure) ([place]) 09/16/2013   a.) TTE 09/16/2013: EF 55-60%; mod inferior HK, triv TR; G1DD. b.) TTE 01/02/2016: EF 25-30%; mid-apicalateroseptal, lat, inf, inferoseptal, apical akinesis; triv TR; G1DD. c. TTE 04/16/2016: EF 65%; G1DD. d.) TTE 05/02/2020: EF 50-55%; G2DD. e.) TTE 04/11/2021: EF 55%; G1DD.   Chronic low back pain    Clostridioides difficile infection 2015   COPD (chronic obstructive pulmonary disease) (HCC)    Coronary artery disease    a.) PCI 02/06/2011: 100% mLCx - DES x 3. b.) PCI 09/17/2011: 95% dRCA - DES x 1. c.) PCI 10/02/2011: 75% pLCX - DES x 1. d.) PCI 08/19/2012: DES x 2 p-mRCA.  e.) [place] 01/01/16: Takotsubo event 01/01/2016 with patent stents   Depression    Diverticulosis    Frequent PVCs    a. Noted in hospital 12/2015.   GERD (gastroesophageal reflux disease)    History of heart artery stent    a.) TOTAL of 7 stents: a.) 02/06/2011 - overlapping 2.5x12 mm Xience V, 2.5x8 mm Xience V, 2.25x8 mm Xience Nano to mLCx. b.) 09/17/2011 - 2.5x23 Xience V dRCA. c.) 10/02/2011 - 2.5x15 mm Xience V pLCx. d.) 08/09/2012 - overlapping 3.0x12m mRCA and 3.5x269mpRCA   Hyperlipidemia    Hypertension    Hypothyroidism    Impingement syndrome of left shoulder    LBBB (left bundle branch block)    Long term current use of antithrombotics/antiplatelets    a.) clopidogrel   Marijuana abuse    [facility]   a.) multiple MIs;  5 per her report   OSA (obstructive sleep  apnea)    a.) mild; does not require nocturnal PAP therapy   Paroxysmal SVT (supraventricular tachycardia) 05/08/2021   a.)  Zio patch 05/08/2021: 3 distinct SVT runs; fastest 5 beats at a rate of 146 bpm; longest 7 beats at rate of 134 bpm   T2[place] (type 2 diabetes mellitus) [place] [place]   Takotsubo cardiomyopathy    a. 12/2015 - nephew committed suicide 1 week prior, sister died the morning of presentation - initially called a STEMI; cath with patent stents. LVEF 25-30%.   Tendonitis of left rotator cuff    Tobacco abuse    Vascular dementia    Past Surgical History:  Procedure Laterality Date   ABDOMINAL AORTOGRAM W/LOWER EXTREMITY N/A 09/12/2020   Procedure: ABDOMINAL AORTOGRAM W/LOWER EXTREMITY;  Surgeon: ArWellington HampshireMD;  [place];  Service: Cardiovascular;  Laterality: N/A;   ABDOMINAL HYSTERECTOMY     APPENDECTOMY     BACK SURGERY     CARDIAC CATHETERIZATION N/A 01/01/2016   Procedure: Left Heart Cath and Coronary Angiography;  Surgeon: JaJettie BoozeMD;  Location:  [place] [facility];  Service: Cardiovascular;  Laterality: N/A;   CATARACT EXTRACTION W/PHACO Left 01/30/2015   Procedure: CATARACT EXTRACTION PHACO AND INTRAOCULAR LENS PLACEMENT (IOC);  Surgeon: Birder Robson, MD;  Location: [facility] ORS;  Service: Ophthalmology;  Laterality: Left;  [place] 00:47    CATARACT EXTRACTION W/PHACO Right 02/13/2015   Procedure: CATARACT EXTRACTION PHACO AND INTRAOCULAR LENS PLACEMENT (IOC);  Surgeon: Birder Robson, MD;  Location: [facility] ORS;  Service: Ophthalmology;  Laterality: Right;  cassette lot # [id number] H [place]  00:29.9 AP  20.7 CDE  6.20   COLONOSCOPY N/A 11/02/2014   Procedure: COLONOSCOPY;  Surgeon: Inda Castle, MD;  Location: [facility];  Service: Endoscopy;  Laterality: N/A;   CORONARY ANGIOPLASTY WITH STENT PLACEMENT Left 02/06/2011   Procedure: CORONARY ANGIOPLASTY WITH STENT PLACEMENT; Location: [place]; Surgeon: Katrine Coho, MD   CORONARY ANGIOPLASTY  WITH STENT PLACEMENT Left 09/17/2011   Procedure: CORONARY ANGIOPLASTY WITH STENT PLACEMENT; Location: [place]; Surgeon: Katrine Coho, MD   CORONARY ANGIOPLASTY WITH STENT PLACEMENT Left 10/02/2011   Procedure: CORONARY ANGIOPLASTY WITH STENT PLACEMENT; Location: [place]; Surgeon: Katrine Coho, MD   CORONARY ANGIOPLASTY WITH STENT PLACEMENT Left 08/19/2012   Procedure: CORONARY ANGIOPLASTY WITH STENT PLACEMENT; Location: [place]; Surgeon: Kathlyn Sacramento, MD   ESOPHAGOGASTRODUODENOSCOPY (EGD) WITH PROPOFOL N/A 09/21/2018   Procedure: ESOPHAGOGASTRODUODENOSCOPY (EGD) WITH PROPOFOL;  Surgeon: [name], MD;  Location: [facility] ENDOSCOPY;  Service: Gastroenterology;  Laterality: N/A;   LEFT HEART CATH AND CORONARY ANGIOGRAPHY Left 08/02/2014   Procedure: LEFT HEART CATH AND CORONARY ANGIOGRAPHY; Location: [facility]; Surgeon: Glenetta Hew, MD   PERIPHERAL VASCULAR ATHERECTOMY Left 09/12/2020   Procedure: PERIPHERAL VASCULAR ATHERECTOMY;  Surgeon: Wellington Hampshire, MD;  Location: [place] [facility];  Service: Cardiovascular;  Laterality: Left;   PERIPHERAL VASCULAR BALLOON ANGIOPLASTY  09/12/2020   Procedure: PERIPHERAL VASCULAR BALLOON ANGIOPLASTY;  Surgeon: Wellington Hampshire, MD;  Location: [place] CV LAB;  Service: Cardiovascular;;   REVERSE SHOULDER ARTHROPLASTY Left 10/01/2021   Procedure: REVERSE SHOULDER ARTHROPLASTY WITH BICEPS TENODESIS.;  Surgeon: Corky Mull, MD;  Location: [facility] ORS;  Service: Orthopedics;  Laterality: Left;   SHOULDER SURGERY Left 2017   Social History   Tobacco Use   Smoking status: Every Day    Packs/day: 1.00    Years: 45.00    Total pack years: 45.00    Types: Cigarettes   Smokeless tobacco: Never   Tobacco comments:    Has cut back, trying to quit.   Vaping Use   Vaping Use: Never used  Substance Use Topics   Alcohol use: No    Alcohol/week: 0.0 standard drinks of alcohol   Drug use: Yes    Types: Marijuana    Comment: last noc   Family History   Problem Relation Age of Onset   Heart attack Mother        First MI @ 25 - 7 @ 43   Heart disease Mother    Heart disease Father        Died @ 8   Throat cancer Brother    Liver cancer Brother    Colon cancer Sister    No Known Allergies    Review of Systems  Constitutional:  Positive for chills. Negative for fever.  Respiratory:  Negative for shortness of breath.   Cardiovascular:  Negative for chest pain.  Gastrointestinal:  Positive for abdominal pain, constipation, nausea and vomiting. Negative for blood in stool, diarrhea and heartburn.       Decreased appetite  Genitourinary:  Positive for frequency. Negative for urgency.  Neurological:  Positive for dizziness and weakness. Negative for headaches.      Objective:     BP 118/62   Pulse 70   Temp (!) 97.3 F (36.3 C) (Temporal)   Ht 5' 6.5" (1.689 m)   Wt 135 lb (61.2 kg)   SpO2 98%   BMI 21.46 kg/m  BP Readings from Last 3 Encounters:  09/02/22 118/62  08/28/22 130/80  08/06/22 (!) 152/80   Wt Readings from Last 3 Encounters:  09/02/22 135 lb (61.2 kg)  08/28/22 133 lb 12.8 oz (60.7 kg)  08/06/22 138 lb (62.6 kg)      Physical Exam Vitals and nursing note reviewed.  Cardiovascular:     Rate and Rhythm: Normal rate and regular rhythm.     Pulses: Normal pulses.     Heart sounds: Normal heart sounds.  Pulmonary:     Effort: Pulmonary effort is normal.     Breath sounds: Normal breath sounds.  Abdominal:     General: Bowel sounds are increased.     Palpations: Abdomen is soft.     Tenderness: There is no abdominal tenderness.  Neurological:     Mental Status: She is alert and oriented to person, place, and time.     Motor: No weakness.  Psychiatric:        Mood and Affect: Mood normal.        Behavior: Behavior normal.      No results found for any visits on 09/02/22.     The ASCVD Risk score (Arnett DK, et al., 2019) failed to calculate for the following reasons:   The valid total  cholesterol range is 130 to 320 mg/dL    Assessment & Plan:   Problem List Items Addressed This Visit       Cardiovascular and Mediastinum   Diabetes mellitus type 2 with peripheral artery disease (HCC) (Chronic)    Uncontrolled. Believe her high readings could be from her being sick this week.   Continue Novolog at 16 units in AM, reduce to 14 units in afternoon and 16 units in PM with reduction to 14 units if eating smaller meals.   She will continue to monitor her glucose levels and update.   Follow up in April.       Relevant Medications   glipiZIDE (GLUCOTROL XL) 10 MG 24 hr tablet     Other   Dizziness - Primary    Uncontrolled.  Differential dx include GERD, Gastroparesis, Vertigo.  Symptoms representative of vertigo.   Has seen ENT in 2023 and she will call to make an appointment. She will update if she needs a new referral.   Given her history of diabetes and decrease appetite, offered GI referral to rule out gastroparesis. She declines at this time.        Relevant Orders   CBC with Differential   Basic Metabolic Panel    No follow-ups on file.    Tinnie Gens, BSN-RN, DNP STUDENT

## 2022-09-02 NOTE — Assessment & Plan Note (Addendum)
Uncontrolled. Believe her high readings could be from her being sick this week.   Continue Novolog at 16 units in AM, reduce to 14 units in afternoon and 16 units in PM with reduction to 14 units if eating smaller meals.  Continue Glipizide XL 10 mg daily for now.  Not a candidate for GLP1 agonist given her already chronically declined appetite. Consider SGLT2 inhibitor if needed. Will need to consult with cardiology and closely monitor for volume depletion if we decide to pursue,   She will continue to monitor her glucose levels and update.   Follow up in April.   I evaluated patient, was consulted regarding treatment, and agree with assessment and plan per Tinnie Gens, RN, DNP student.   Allie Bossier, NP-C

## 2022-09-03 ENCOUNTER — Emergency Department: Payer: Medicare PPO

## 2022-09-03 ENCOUNTER — Observation Stay
Admission: EM | Admit: 2022-09-03 | Discharge: 2022-09-04 | Disposition: A | Discharge status: Home / Self Care | Payer: Medicare PPO | Attending: Internal Medicine | Admitting: Internal Medicine

## 2022-09-03 DIAGNOSIS — R7401 Elevation of levels of liver transaminase levels: Secondary | ICD-10-CM

## 2022-09-03 DIAGNOSIS — R0602 Shortness of breath: Secondary | ICD-10-CM | POA: Diagnosis not present

## 2022-09-03 DIAGNOSIS — I2510 Atherosclerotic heart disease of native coronary artery without angina pectoris: Secondary | ICD-10-CM | POA: Diagnosis not present

## 2022-09-03 DIAGNOSIS — I130 Hypertensive heart and chronic kidney disease with heart failure and stage 1 through stage 4 chronic kidney disease, or unspecified chronic kidney disease: Secondary | ICD-10-CM | POA: Diagnosis not present

## 2022-09-03 DIAGNOSIS — Z794 Long term (current) use of insulin: Secondary | ICD-10-CM | POA: Diagnosis not present

## 2022-09-03 DIAGNOSIS — Z955 Presence of coronary angioplasty implant and graft: Secondary | ICD-10-CM | POA: Insufficient documentation

## 2022-09-03 DIAGNOSIS — I5032 Chronic diastolic (congestive) heart failure: Secondary | ICD-10-CM | POA: Diagnosis present

## 2022-09-03 DIAGNOSIS — I10 Essential (primary) hypertension: Secondary | ICD-10-CM | POA: Diagnosis present

## 2022-09-03 DIAGNOSIS — R42 Dizziness and giddiness: Secondary | ICD-10-CM | POA: Diagnosis not present

## 2022-09-03 DIAGNOSIS — E039 Hypothyroidism, unspecified: Secondary | ICD-10-CM | POA: Diagnosis not present

## 2022-09-03 DIAGNOSIS — E1122 Type 2 diabetes mellitus with diabetic chronic kidney disease: Secondary | ICD-10-CM | POA: Insufficient documentation

## 2022-09-03 DIAGNOSIS — F4323 Adjustment disorder with mixed anxiety and depressed mood: Secondary | ICD-10-CM | POA: Diagnosis present

## 2022-09-03 DIAGNOSIS — E1151 Type 2 diabetes mellitus with diabetic peripheral angiopathy without gangrene: Secondary | ICD-10-CM

## 2022-09-03 DIAGNOSIS — J449 Chronic obstructive pulmonary disease, unspecified: Secondary | ICD-10-CM | POA: Diagnosis present

## 2022-09-03 DIAGNOSIS — I951 Orthostatic hypotension: Secondary | ICD-10-CM | POA: Diagnosis present

## 2022-09-03 DIAGNOSIS — I739 Peripheral vascular disease, unspecified: Secondary | ICD-10-CM | POA: Diagnosis not present

## 2022-09-03 DIAGNOSIS — R9082 White matter disease, unspecified: Secondary | ICD-10-CM | POA: Diagnosis not present

## 2022-09-03 DIAGNOSIS — G4733 Obstructive sleep apnea (adult) (pediatric): Secondary | ICD-10-CM | POA: Diagnosis present

## 2022-09-03 DIAGNOSIS — Z1152 Encounter for screening for COVID-19: Secondary | ICD-10-CM | POA: Diagnosis not present

## 2022-09-03 DIAGNOSIS — Z96612 Presence of left artificial shoulder joint: Secondary | ICD-10-CM | POA: Diagnosis not present

## 2022-09-03 DIAGNOSIS — Z7984 Long term (current) use of oral hypoglycemic drugs: Secondary | ICD-10-CM | POA: Diagnosis not present

## 2022-09-03 DIAGNOSIS — G8929 Other chronic pain: Secondary | ICD-10-CM | POA: Diagnosis present

## 2022-09-03 DIAGNOSIS — N1832 Chronic kidney disease, stage 3b: Secondary | ICD-10-CM

## 2022-09-03 DIAGNOSIS — N179 Acute kidney failure, unspecified: Principal | ICD-10-CM | POA: Diagnosis present

## 2022-09-03 DIAGNOSIS — N281 Cyst of kidney, acquired: Secondary | ICD-10-CM | POA: Diagnosis not present

## 2022-09-03 DIAGNOSIS — F17210 Nicotine dependence, cigarettes, uncomplicated: Secondary | ICD-10-CM | POA: Insufficient documentation

## 2022-09-03 DIAGNOSIS — R638 Other symptoms and signs concerning food and fluid intake: Secondary | ICD-10-CM

## 2022-09-03 DIAGNOSIS — E11649 Type 2 diabetes mellitus with hypoglycemia without coma: Secondary | ICD-10-CM

## 2022-09-03 DIAGNOSIS — R109 Unspecified abdominal pain: Secondary | ICD-10-CM | POA: Insufficient documentation

## 2022-09-03 DIAGNOSIS — Z79899 Other long term (current) drug therapy: Secondary | ICD-10-CM | POA: Diagnosis not present

## 2022-09-03 DIAGNOSIS — Z7902 Long term (current) use of antithrombotics/antiplatelets: Secondary | ICD-10-CM | POA: Diagnosis not present

## 2022-09-03 HISTORY — DX: Chronic kidney disease, stage 3b: N18.32

## 2022-09-03 LAB — CBC WITH DIFFERENTIAL/PLATELET
Basophils Absolute: 0.1 10*3/uL (ref 0.0–0.1)
Basophils Relative: 0.6 % (ref 0.0–3.0)
Eosinophils Absolute: 0.1 10*3/uL (ref 0.0–0.7)
Eosinophils Relative: 1.1 % (ref 0.0–5.0)
HCT: 44.3 % (ref 36.0–46.0)
Hemoglobin: 15 g/dL (ref 12.0–15.0)
Lymphocytes Relative: 29.3 % (ref 12.0–46.0)
Lymphs Abs: 2.5 10*3/uL (ref 0.7–4.0)
MCHC: 33.8 g/dL (ref 30.0–36.0)
MCV: 99.5 fl (ref 78.0–100.0)
Monocytes Absolute: 0.5 10*3/uL (ref 0.1–1.0)
Monocytes Relative: 6.3 % (ref 3.0–12.0)
Neutro Abs: 5.4 10*3/uL (ref 1.4–7.7)
Neutrophils Relative %: 62.7 % (ref 43.0–77.0)
Platelets: 286 10*3/uL (ref 150.0–400.0)
RBC: 4.46 Mil/uL (ref 3.87–5.11)
RDW: 13.5 % (ref 11.5–15.5)
WBC: 8.7 10*3/uL (ref 4.0–10.5)

## 2022-09-03 LAB — CBC
HCT: 44 % (ref 36.0–46.0)
Hemoglobin: 14.5 g/dL (ref 12.0–15.0)
MCH: 32.4 pg (ref 26.0–34.0)
MCHC: 33 g/dL (ref 30.0–36.0)
MCV: 98.2 fL (ref 80.0–100.0)
Platelets: 249 10*3/uL (ref 150–400)
RBC: 4.48 MIL/uL (ref 3.87–5.11)
RDW: 12.9 % (ref 11.5–15.5)
WBC: 10.1 10*3/uL (ref 4.0–10.5)
nRBC: 0 % (ref 0.0–0.2)

## 2022-09-03 LAB — BASIC METABOLIC PANEL
Anion gap: 11 (ref 5–15)
BUN: 35 mg/dL — ABNORMAL HIGH (ref 6–23)
BUN: 40 mg/dL — ABNORMAL HIGH (ref 8–23)
CO2: 28 mEq/L (ref 19–32)
CO2: 27 mmol/L (ref 22–32)
Calcium: 9.2 mg/dL (ref 8.4–10.5)
Calcium: 9.1 mg/dL (ref 8.9–10.3)
Chloride: 99 mEq/L (ref 96–112)
Chloride: 100 mmol/L (ref 98–111)
Creatinine, Ser: 2.39 mg/dL — ABNORMAL HIGH (ref 0.40–1.20)
Creatinine, Ser: 2.32 mg/dL — ABNORMAL HIGH (ref 0.44–1.00)
GFR, Estimated: 22 mL/min — ABNORMAL LOW (ref >60)
GFR: 19.51 mL/min — ABNORMAL LOW (ref >60.00)
Glucose, Bld: 158 mg/dL — ABNORMAL HIGH (ref 70–99)
Glucose, Bld: 65 mg/dL — ABNORMAL LOW (ref 70–99)
Potassium: 4.7 mEq/L (ref 3.5–5.1)
Potassium: 3.9 mmol/L (ref 3.5–5.1)
Sodium: 136 mEq/L (ref 135–145)
Sodium: 138 mmol/L (ref 135–145)

## 2022-09-03 LAB — URINALYSIS, ROUTINE W REFLEX MICROSCOPIC
Bilirubin Urine: NEGATIVE
Glucose, UA: 50 mg/dL — AB
Ketones, ur: NEGATIVE mg/dL
Nitrite: NEGATIVE
Protein, ur: 30 mg/dL — AB
Specific Gravity, Urine: 1.012 (ref 1.005–1.030)
pH: 5 (ref 5.0–8.0)

## 2022-09-03 LAB — RESP PANEL BY RT-PCR (RSV, FLU A&B, COVID)  RVPGX2
Influenza A by PCR: NEGATIVE
Influenza B by PCR: NEGATIVE
Resp Syncytial Virus by PCR: NEGATIVE
SARS Coronavirus 2 by RT PCR: NEGATIVE

## 2022-09-03 LAB — HEPATIC FUNCTION PANEL
ALT: 125 U/L — ABNORMAL HIGH (ref 0–44)
AST: 129 U/L — ABNORMAL HIGH (ref 15–41)
Albumin: 3.7 g/dL (ref 3.5–5.0)
Alkaline Phosphatase: 83 U/L (ref 38–126)
Bilirubin, Direct: <0.1 mg/dL (ref 0.0–0.2)
Total Bilirubin: 0.6 mg/dL (ref 0.3–1.2)
Total Protein: 6.5 g/dL (ref 6.5–8.1)

## 2022-09-03 LAB — ETHANOL: Alcohol, Ethyl (B): <10 mg/dL (ref <10)

## 2022-09-03 LAB — LIPASE, BLOOD: Lipase: 52 U/L — ABNORMAL HIGH (ref 11–51)

## 2022-09-03 LAB — TROPONIN I (HIGH SENSITIVITY)
Troponin I (High Sensitivity): 12 ng/L (ref <18)
Troponin I (High Sensitivity): 14 ng/L (ref <18)

## 2022-09-03 LAB — CBG MONITORING, ED: Glucose-Capillary: 81 mg/dL (ref 70–99)

## 2022-09-03 LAB — BRAIN NATRIURETIC PEPTIDE: B Natriuretic Peptide: 24.1 pg/mL (ref 0.0–100.0)

## 2022-09-03 MED ORDER — SODIUM CHLORIDE 0.9 % IV BOLUS
1000.0000 mL | Freq: Once | INTRAVENOUS | Status: AC
  Administered 2022-09-03: 1000 mL via INTRAVENOUS

## 2022-09-03 MED ORDER — SODIUM CHLORIDE 0.9 % IV BOLUS: 500.0000 mL | Freq: Once | INTRAVENOUS | Status: DC

## 2022-09-03 NOTE — H&P (Incomplete)
History and Physical    Patient: Sarah Payne [id number] DOB: 06-03-1946 DOA: 09/03/2022 DOS: the patient was seen and examined on 09/03/2022 PCP: [name], NP  Patient coming from: Home  Chief Complaint:  Chief Complaint  Patient presents with  . Nausea  . Dizziness    HPI: Sarah Payne is a 77 y.o. female with medical history significant for CAD s/p PCI, HFpEF, PAD status post orbital atherectomy and  balloon angioplasty to the left SFA in 08/2020, DM2, CKD stage IIIb, HTN, HLD, COPD, hypothyroidism, orthostatic dizziness versus vertigo, and sleep apnea who presents to the ED with worsening renal function and worsening of her chronic dizziness.  Patient has been followed by cardiology in the past for dizzy episodes which are felt possibly related to orthostatic hypotension versus vertigo.  She underwent extensive cardiac workup in 2022 including echo, cardiac monitoring with Zio patch, carotid Doppler with no significant abnormal finding.  Over the past week symptoms have been worse and patient has had decreased oral intake and nausea without vomiting.  She denies abdominal pain, diarrhea, fever or chills.  She does endorse mild shortness of breath ED course and data review: BP 140/76 with otherwise normal vitals.  Labs notable for normal CBC2,, BMP with BUN/creatinine 40/2.32.  Above most recent creatinine of 1.62 about 11 months prior.  Blood sugar 65.  Lipase 52, with transaminitis of AST 129 and ALT 125.  Troponin and BNP normal, alcohol level less than 0.10.  Urinalysis not consistent with UTI. EKG G personally viewed and interpreted showing NSR at 66 with nonspecific ST-T wave changes. CT head nonacute CT chest abdomen pelvis without contrast nonacute Patient given an NS bolus and hospitalist consulted for admission.   Review of Systems: As mentioned in the history of present illness. All other systems reviewed and are negative.  Past Medical History:  Diagnosis Date   . Aortic atherosclerosis ([place])   . Bilateral cataracts 01/2015   a.) s/p extraction  . CHF (congestive heart failure) ([place]) 09/16/2013   a.) TTE 09/16/2013: EF 55-60%; mod inferior HK, triv TR; G1DD. b.) TTE 01/02/2016: EF 25-30%; mid-apicalateroseptal, lat, inf, inferoseptal, apical akinesis; triv TR; G1DD. c. TTE 04/16/2016: EF 65%; G1DD. d.) TTE 05/02/2020: EF 50-55%; G2DD. e.) TTE 04/11/2021: EF 55%; G1DD.  Marland Kitchen Chronic low back pain   . Clostridioides difficile infection 2015  . COPD (chronic obstructive pulmonary disease) ([place])   . Coronary artery disease    a.) PCI 02/06/2011: 100% mLCx - DES x 3. b.) PCI 09/17/2011: 95% dRCA - DES x 1. c.) PCI 10/02/2011: 75% pLCX - DES x 1. d.) PCI 08/19/2012: DES x 2 p-mRCA.  e.) [place] 01/01/16: Takotsubo event 01/01/2016 with patent stents  . Depression   . Diverticulosis   . Frequent PVCs    a. Noted in hospital 12/2015.  Marland Kitchen GERD (gastroesophageal reflux disease)   . History of heart artery stent    a.) TOTAL of 7 stents: a.) 02/06/2011 - overlapping 2.5x12 mm Xience V, 2.5x8 mm Xience V, 2.25x8 mm Xience Nano to mLCx. b.) 09/17/2011 - 2.5x23 Xience V dRCA. c.) 10/02/2011 - 2.5x15 mm Xience V pLCx. d.) 08/09/2012 - overlapping 3.0x82m mRCA and 3.5x242mpRCA  . Hyperlipidemia   . Hypertension   . Hypothyroidism   . Impingement syndrome of left shoulder   . LBBB (left bundle branch block)   . Long term current use of antithrombotics/antiplatelets    a.) clopidogrel  . Marijuana abuse   . Myocardial  infarction [facility])    a.) multiple MIs;  5 per her report  . OSA (obstructive sleep apnea)    a.) mild; does not require nocturnal PAP therapy  . Paroxysmal SVT (supraventricular tachycardia) 05/08/2021   a.)  Zio patch 05/08/2021: 3 distinct SVT runs; fastest 5 beats at a rate of 146 bpm; longest 7 beats at rate of 134 bpm  . T2DM (type 2 diabetes mellitus) ([place])   . Takotsubo cardiomyopathy    a. 12/2015 - nephew committed suicide 1 week prior, sister  died the morning of presentation - initially called a STEMI; cath with patent stents. LVEF 25-30%.  . Tendonitis of left rotator cuff   . Tobacco abuse   . Vascular dementia    Past Surgical History:  Procedure Laterality Date  . ABDOMINAL AORTOGRAM W/LOWER EXTREMITY N/A 09/12/2020   Procedure: ABDOMINAL AORTOGRAM W/LOWER EXTREMITY;  Surgeon: Wellington Hampshire, MD;  Location: [place] [facility];  Service: Cardiovascular;  Laterality: N/A;  . ABDOMINAL HYSTERECTOMY    . APPENDECTOMY    . BACK SURGERY    . CARDIAC CATHETERIZATION N/A 01/01/2016   Procedure: Left Heart Cath and Coronary Angiography;  Surgeon: Jettie Booze, MD;  Location: [place] [facility];  Service: Cardiovascular;  Laterality: N/A;  . CATARACT EXTRACTION W/PHACO Left 01/30/2015   Procedure: CATARACT EXTRACTION PHACO AND INTRAOCULAR LENS PLACEMENT (IOC);  Surgeon: Birder Robson, MD;  Location: [facility];  Service: Ophthalmology;  Laterality: Left;  [place] 00:47   . CATARACT EXTRACTION W/PHACO Right 02/13/2015   Procedure: CATARACT EXTRACTION PHACO AND INTRAOCULAR LENS PLACEMENT (IOC);  Surgeon: Birder Robson, MD;  Location: [facility];  Service: Ophthalmology;  Laterality: Right;  cassette lot # [id number] H [place]  00:29.9 AP  20.7 CDE  6.20  . COLONOSCOPY N/A 11/02/2014   Procedure: COLONOSCOPY;  Surgeon: Inda Castle, MD;  Location: [place];  Service: Endoscopy;  Laterality: N/A;  . CORONARY ANGIOPLASTY WITH STENT PLACEMENT Left 02/06/2011   Procedure: CORONARY ANGIOPLASTY WITH STENT PLACEMENT; Location: [place]; Surgeon: Katrine Coho, MD  . [place] Left 09/17/2011   Procedure: CORONARY ANGIOPLASTY WITH STENT PLACEMENT; Location: [place]; Surgeon: Katrine Coho, MD  . [place] Left 10/02/2011   Procedure: CORONARY ANGIOPLASTY WITH STENT PLACEMENT; Location: [place]; Surgeon: Katrine Coho, MD  . CORONARY ANGIOPLASTY WITH STENT PLACEMENT Left 08/19/2012    Procedure: CORONARY ANGIOPLASTY WITH STENT PLACEMENT; Location: [place]; Surgeon: Kathlyn Sacramento, MD  . ESOPHAGOGASTRODUODENOSCOPY (EGD) WITH PROPOFOL N/A 09/21/2018   Procedure: ESOPHAGOGASTRODUODENOSCOPY (EGD) WITH PROPOFOL;  Surgeon: [name], MD;  Location[facility];  Service: Gastroenterology;  Laterality: N/A;  . LEFT HEART CATH AND CORONARY ANGIOGRAPHY Left 08/02/2014   Procedure: LEFT HEART CATH AND CORONARY ANGIOGRAPHY; Location: [facility]; Surgeon: Glenetta Hew, MD  . PERIPHERAL VASCULAR ATHERECTOMY Left 09/12/2020   Procedure: PERIPHERAL VASCULAR ATHERECTOMY;  Surgeon: Wellington Hampshire, MD;  Location: [place] [facility];  Service: Cardiovascular;  Laterality: Left;  . PERIPHERAL VASCULAR BALLOON ANGIOPLASTY  09/12/2020   Procedure: PERIPHERAL VASCULAR BALLOON ANGIOPLASTY;  Surgeon: Wellington Hampshire, MD;  Location: [place] [facility];  Service: Cardiovascular;;  . REVERSE SHOULDER ARTHROPLASTY Left 10/01/2021   Procedure: REVERSE SHOULDER ARTHROPLASTY WITH BICEPS TENODESIS.;  Surgeon: Corky Mull, MD;  Location: [facility];  Service: Orthopedics;  Laterality: Left;  . SHOULDER SURGERY Left 2017   Social History:  reports that she has been smoking cigarettes. She has a 45.00 pack-year smoking history. She has never used smokeless tobacco.  She reports current drug use. Drug: Marijuana. She reports that she does not drink alcohol.  No Known Allergies  Family History  Problem Relation Age of Onset  . Heart attack Mother        First MI @ 11 - Died @ 73  . Heart disease Mother   . Heart disease Father        Died @ 43  . Throat cancer Brother   . Liver cancer Brother   . Colon cancer Sister     Prior to Admission medications   Medication Sig Start Date End Date Taking? Authorizing Provider  albuterol (VENTOLIN HFA) 108 (90 Base) MCG/ACT inhaler Inhale 2 puffs into the lungs every 6 (six) hours as needed for wheezing or shortness of breath. 01/14/22   [name],  NP  amitriptyline (ELAVIL) 50 MG tablet Take 1 tablet (50 mg total) by mouth at bedtime as needed for sleep. 07/09/21 08/28/22  Gillis Santa, MD  carvedilol (COREG) 3.125 MG tablet Take 1 tablet (3.125 mg total) by mouth 2 (two) times daily. 02/17/22   Dunn, Areta Haber, PA-C  clobetasol cream (TEMOVATE) [id number] % Apply 1 Application topically 2 (two) times daily as needed. For vaginal itching. 04/16/22   [name], NP  clopidogrel (PLAVIX) 75 MG tablet Take 1 tablet (75 mg total) by mouth daily. 02/17/22   Rise Mu, PA-C  Continuous Blood Gluc Receiver (FREESTYLE LIBRE 2 READER) DEVI Use with sensor to check blood sugars 6 times daily. 02/16/21   [name], NP  Continuous Blood Gluc Sensor (FREESTYLE LIBRE 2 SENSOR) MISC USE TO CHECK SUGARS 6 TIMES DAILY - CHANGE OUT EVERY 14 DAYS Patient not taking: Reported on 09/02/2022 05/16/22   [name], NP  enalapril (VASOTEC) 10 MG tablet Take 1 tablet (10 mg total) by mouth daily. 02/17/22   Rise Mu, PA-C  ezetimibe (ZETIA) 10 MG tablet Take 1 tablet (10 mg total) by mouth daily. For cholesterol. 04/12/21   [name] D, PA-C  furosemide (LASIX) 20 MG tablet Take 1 tablet (20 mg total) by mouth as needed (As needed for weight gain greater than 3 pounds overnight or increased shortness of breath.). Take one tab only on Monday, Wednesday, & Fridays 08/29/22   Rise Mu, PA-C  gabapentin (NEURONTIN) 600 MG tablet Take 1 tablet (600 mg total) by mouth every 12 (twelve) hours. 08/06/22   Gillis Santa, MD  glipiZIDE (GLUCOTROL XL) 10 MG 24 hr tablet Take 10 mg by mouth daily with breakfast.    [provider]  glucose blood (FREESTYLE LITE) test strip Test up to four times a day as needed. DX 11/27/20   [name], NP  insulin aspart (NOVOLOG FLEXPEN) 100 UNIT/ML FlexPen Inject 18 units into the skin with breakfast, 14 units with lunch, and 16 units with dinner for diabetes. 07/31/22   [name], NP  insulin  glargine (LANTUS) 100 UNIT/ML Solostar Pen Inject 8 Units into the skin at bedtime. Patient not taking: Reported on 09/02/2022    [provider]  Insulin Pen Needle (INSUPEN PEN NEEDLES) 32G X 4 MM MISC Use to inject insulin 4 times daily. 07/17/20   [name], NP  levothyroxine (SYNTHROID) 100 MCG tablet Take 100 mcg by mouth daily before breakfast.    [provider]  mirtazapine (REMERON) 7.5 MG tablet Take 1 tablet (7.5 mg total) by mouth at bedtime. For appetite and sleep. 10/11/21   [place] Friendly  K, NP  ondansetron (ZOFRAN) 4 MG tablet Take 1 tablet (4 mg total) by mouth every 6 (six) hours as needed for nausea. 10/03/21   Lattie Corns, PA-C  potassium chloride (KLOR-CON M) 10 MEQ tablet Only take on days you take your lasix. Take 1 tablet (10 mEq total) by mouth for 1 dose on those days you take your lasix 64m pill. 02/17/22   Dunn, RAreta Haber PA-C  rosuvastatin (CRESTOR) 40 MG tablet Take 1 tablet (40 mg total) by mouth every evening. For cholesterol. 02/17/22   DRise Mu PA-C  traZODone (DESYREL) 100 MG tablet TAKE 1 TABLET BY MOUTH AT BEDTIME AS NEEDED FOR SLEEP 04/10/21   CPleas Koch NP    Physical Exam: Vitals:   09/03/22 1806 09/03/22 1807 09/03/22 2309  BP:  (!) 140/76 111/60  Pulse:  68 65  Resp:  16 18  Temp:  97.6 F (36.4 C) 97.8 F (36.6 C)  TempSrc:  Oral Oral  SpO2:  100% 94%  Weight: 60.3 kg    Height: 5' 6"$  (1.676 m)     Physical Exam  Labs on Admission: I have personally reviewed following labs and imaging studies  CBC: Recent Labs  Lab 09/02/22 1545 09/03/22 1812  WBC 8.7 10.1  NEUTROABS 5.4  --   HGB 15.0 14.5  HCT 44.3 44.0  MCV 99.5 98.2  PLT 286.0 20000000  Basic Metabolic Panel: Recent Labs  Lab 08/28/22 1509 09/02/22 1545 09/03/22 1812  NA 132* 136 138  K 4.0 4.7 3.9  CL 94* 99 100  CO2 28 28 27  $ GLUCOSE 301* 158* 65*  BUN 28* 35* 40*  CREATININE 1.95* 2.39* 2.32*  CALCIUM 9.4 9.2 9.1    GFR: Estimated Creatinine Clearance: 19.9 mL/min (A) (by C-G formula based on SCr of 2.32 mg/dL (H)). Liver Function Tests: Recent Labs  Lab 08/28/22 1509 09/03/22 2108  AST 29 129*  ALT 24 125*  ALKPHOS 81 83  BILITOT 0.7 0.6  PROT 7.9 6.5  ALBUMIN 4.4 3.7   Recent Labs  Lab 09/03/22 2108  LIPASE 52*   No results for input(s): "AMMONIA" in the last 168 hours. Coagulation Profile: No results for input(s): "INR", "PROTIME" in the last 168 hours. Cardiac Enzymes: No results for input(s): "CKTOTAL", "CKMB", "CKMBINDEX", "TROPONINI" in the last 168 hours. BNP (last 3 results) No results for input(s): "PROBNP" in the last 8760 hours. HbA1C: No results for input(s): "HGBA1C" in the last 72 hours. CBG: Recent Labs  Lab 09/03/22 1816  GLUCAP 81   Lipid Profile: No results for input(s): "CHOL", "HDL", "LDLCALC", "TRIG", "CHOLHDL", "LDLDIRECT" in the last 72 hours. Thyroid Function Tests: No results for input(s): "TSH", "T4TOTAL", "FREET4", "T3FREE", "THYROIDAB" in the last 72 hours. Anemia Panel: No results for input(s): "VITAMINB12", "FOLATE", "FERRITIN", "TIBC", "IRON", "RETICCTPCT" in the last 72 hours. Urine analysis:    Component Value Date/Time   COLORURINE YELLOW (A) 09/03/2022 2108   APPEARANCEUR CLEAR (A) 09/03/2022 2108   APPEARANCEUR Clear 06/12/2014 1900   LABSPEC 1.012 09/03/2022 2108   LABSPEC 1.010 06/12/2014 1900   PHURINE 5.0 09/03/2022 2108   GLUCOSEU 50 (A) 09/03/2022 2108   GLUCOSEU Negative 06/12/2014 1900   HGBUR SMALL (A) 09/03/2022 2108   BILIRUBINUR NEGATIVE 09/03/2022 2108   BILIRUBINUR negative 04/16/2022 1152   BILIRUBINUR Negative 06/12/2014 1[place]02/14/2024 2108   PROTEINUR 30 (A) 09/03/2022 2108   UROBILINOGEN 0.2 04/16/2022 1152   NITRITE NEGATIVE 09/03/2022 2108  LEUKOCYTESUR TRACE (A) 09/03/2022 2108   LEUKOCYTESUR 1+ 06/12/2014 1900    Radiological Exams on Admission: CT CHEST ABDOMEN PELVIS WO  CONTRAST  Result Date: 09/03/2022 CLINICAL DATA:  Flank pain. Acute kidney injury. To rule out obstructive. Dizziness and nausea since last week. Back pain and shortness of breath. EXAM: CT CHEST, ABDOMEN AND PELVIS WITHOUT CONTRAST TECHNIQUE: Multidetector CT imaging of the chest, abdomen and pelvis was performed following the standard protocol without IV contrast. RADIATION DOSE REDUCTION: This exam was performed according to the departmental dose-optimization program which includes automated exposure control, adjustment of the mA and/or kV according to patient size and/or use of iterative reconstruction technique. COMPARISON:  CT chest 05/13/2021.  CT abdomen and pelvis 02/06/2016 FINDINGS: CT CHEST FINDINGS Cardiovascular: Normal heart size. No pericardial effusions. Normal caliber thoracic aorta. Calcification in the aorta and coronary arteries. Mediastinum/Nodes: Esophagus is decompressed. No significant lymphadenopathy. Thyroid gland is unremarkable. Lungs/Pleura: Lungs are clear. No pleural effusions. No pneumothorax. Musculoskeletal: Postoperative left shoulder replacement. Degenerative changes in the spine. CT ABDOMEN PELVIS FINDINGS Hepatobiliary: No focal liver abnormality is seen. No gallstones, gallbladder wall thickening, or biliary dilatation. Pancreas: Unremarkable. No pancreatic ductal dilatation or surrounding inflammatory changes. Spleen: Normal in size without focal abnormality. Adrenals/Urinary Tract: No adrenal gland nodules. Left renal cysts measuring up to 2.8 cm diameter, unchanged since prior study. No imaging follow-up is indicated. No hydronephrosis or hydroureter. Central renal calcifications appear to be vascular calcification. No renal or ureteral stone identified. Bladder is normal. Stomach/Bowel: Stomach is mildly distended and filled with ingested material, likely physiologic. No gastric wall thickening is appreciated. Small duodenal diverticulum. Small bowel and colon are not  abnormally distended. No wall thickening or inflammatory changes are appreciated. Colonic diverticulosis without evidence of acute diverticulitis. Appendix is not identified. Vascular/Lymphatic: Aortic atherosclerosis. No enlarged abdominal or pelvic lymph nodes. Reproductive: Status post hysterectomy. No adnexal masses. Other: No abdominal wall hernia or abnormality. No abdominopelvic ascites. Musculoskeletal: Postoperative lumbar fixation from L3 to the sacrum. Degenerative changes in the spine and hips. IMPRESSION: 1. No acute process demonstrated in the chest, abdomen, or pelvis. 2. Aortic atherosclerosis. Electronically Signed   By: Lucienne Capers M.D.   On: 09/03/2022 20:34   CT Head Wo Contrast  Result Date: 09/03/2022 CLINICAL DATA:  Vertigo EXAM: CT HEAD WITHOUT CONTRAST TECHNIQUE: Contiguous axial images were obtained from the base of the skull through the vertex without intravenous contrast. RADIATION DOSE REDUCTION: This exam was performed according to the departmental dose-optimization program which includes automated exposure control, adjustment of the mA and/or kV according to patient size and/or use of iterative reconstruction technique. COMPARISON:  None Available. FINDINGS: Brain: No acute territorial infarction, hemorrhage or intracranial mass. Moderate white matter hypodensity consistent with chronic small vessel disease. Normal ventricle size. Vascular: No hyperdense vessels.  Carotid vascular calcification Skull: Normal. Negative for fracture or focal lesion. Sinuses/Orbits: No acute finding. Other: None IMPRESSION: 1. No CT evidence for acute intracranial abnormality. 2. 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 Electronically Signed   By: Donavan Foil M.D.   On: 09/03/2022 18:51     Data Reviewed: Relevant notes from primary care and specialist visits, past discharge summaries as available in EHR, including Care Everywhere. Prior diagnostic testing as pertinent to  current admission diagnoses Updated medications and problem lists for reconciliation ED course, including vitals, labs, imaging, treatment and response to treatment Triage notes, nursing and pharmacy notes and ED provider's notes Notable results as noted in HPI  Assessment and Plan: No notes have been filed under this hospital service. Service: Hospitalist       DVT prophylaxis: Lovenox***  Consults: none***  Advance Care Planning:   Code Status: Prior ***  Family Communication: none***  Disposition Plan: Back to previous home environment  Severity of Illness: {Observation/Inpatient:21159}  Author: Athena Masse, MD 09/03/2022 11:29 PM  For on call review [email or web address].

## 2022-09-03 NOTE — H&P (Signed)
History and Physical    Patient: Sarah Payne [id number] DOB: 04-23-1946 DOA: 09/03/2022 DOS: the patient was seen and examined on 09/03/2022 PCP: [name], NP  Patient coming from: Home  Chief Complaint:  Chief Complaint  Patient presents with   Nausea   Dizziness    HPI: Sarah Payne is a 77 y.o. female with medical history significant for CAD s/p PCI, HFpEF, PAD status post orbital atherectomy and  balloon angioplasty to the left SFA in 08/2020, DM2, CKD stage IIIb, HTN, HLD, COPD, hypothyroidism, orthostatic dizziness versus vertigo, and sleep apnea who presents to the ED with worsening renal function and worsening of her chronic dizziness.  Patient has been followed by cardiology in the past for dizzy episodes which are felt possibly related to orthostatic hypotension versus vertigo.  She underwent extensive cardiac workup in 2022 including echo, cardiac monitoring with Zio patch, carotid Doppler with no significant abnormal finding.  Over the past week symptoms have been worse and patient has had decreased oral intake and nausea with occasional vomiting.  She denies abdominal pain, diarrhea, fever or chills.  She does endorse mild shortness of breath ED course and data review: BP 140/76 with otherwise normal vitals.  Labs notable for normal CBC2,, BMP with BUN/creatinine 40/2.32.  Above most recent creatinine of 1.62 about 11 months prior.  Blood sugar 65.  Lipase 52, with transaminitis of AST 129 and ALT 125.  Troponin and BNP normal, alcohol level less than 0.10.  Urinalysis not consistent with UTI. EKG G personally viewed and interpreted showing NSR at 66 with nonspecific ST-T wave changes. CT head nonacute CT chest abdomen pelvis without contrast nonacute Patient given an NS bolus and hospitalist consulted for admission.   Review of Systems: As mentioned in the history of present illness. All other systems reviewed and are negative.  Past Medical History:  Diagnosis  Date   Aortic atherosclerosis ([place])    Bilateral cataracts 01/2015   a.) s/p extraction   CHF (congestive heart failure) ([place]) 09/16/2013   a.) TTE 09/16/2013: EF 55-60%; mod inferior HK, triv TR; G1DD. b.) TTE 01/02/2016: EF 25-30%; mid-apicalateroseptal, lat, inf, inferoseptal, apical akinesis; triv TR; G1DD. c. TTE 04/16/2016: EF 65%; G1DD. d.) TTE 05/02/2020: EF 50-55%; G2DD. e.) TTE 04/11/2021: EF 55%; G1DD.   Chronic low back pain    Clostridioides difficile infection 2015   COPD (chronic obstructive pulmonary disease) (HCC)    Coronary artery disease    a.) PCI 02/06/2011: 100% mLCx - DES x 3. b.) PCI 09/17/2011: 95% dRCA - DES x 1. c.) PCI 10/02/2011: 75% pLCX - DES x 1. d.) PCI 08/19/2012: DES x 2 p-mRCA.  e.) [place] 01/01/16: Takotsubo event 01/01/2016 with patent stents   Depression    Diverticulosis    Frequent PVCs    a. Noted in hospital 12/2015.   GERD (gastroesophageal reflux disease)    History of heart artery stent    a.) TOTAL of 7 stents: a.) 02/06/2011 - overlapping 2.5x12 mm Xience V, 2.5x8 mm Xience V, 2.25x8 mm Xience Nano to mLCx. b.) 09/17/2011 - 2.5x23 Xience V dRCA. c.) 10/02/2011 - 2.5x15 mm Xience V pLCx. d.) 08/09/2012 - overlapping 3.0x57m mRCA and 3.5x229mpRCA   Hyperlipidemia    Hypertension    Hypothyroidism    Impingement syndrome of left shoulder    LBBB (left bundle branch block)    Long term current use of antithrombotics/antiplatelets    a.) clopidogrel   Marijuana abuse  Myocardial infarction [facility])    a.) multiple MIs;  5 per her report   OSA (obstructive sleep apnea)    a.) mild; does not require nocturnal PAP therapy   Paroxysmal SVT (supraventricular tachycardia) 05/08/2021   a.)  Zio patch 05/08/2021: 3 distinct SVT runs; fastest 5 beats at a rate of 146 bpm; longest 7 beats at rate of 134 bpm   T2DM (type 2 diabetes mellitus) ([place])    Takotsubo cardiomyopathy    a. 12/2015 - nephew committed suicide 1 week prior, sister died the morning of  presentation - initially called a STEMI; cath with patent stents. LVEF 25-30%.   Tendonitis of left rotator cuff    Tobacco abuse    Vascular dementia    Past Surgical History:  Procedure Laterality Date   ABDOMINAL AORTOGRAM W/LOWER EXTREMITY N/A 09/12/2020   Procedure: ABDOMINAL AORTOGRAM W/LOWER EXTREMITY;  Surgeon: Wellington Hampshire, MD;  Location: [place] [facility];  Service: Cardiovascular;  Laterality: N/A;   ABDOMINAL HYSTERECTOMY     APPENDECTOMY     BACK SURGERY     CARDIAC CATHETERIZATION N/A 01/01/2016   Procedure: Left Heart Cath and Coronary Angiography;  Surgeon: Jettie Booze, MD;  Location: [place] [facility];  Service: Cardiovascular;  Laterality: N/A;   CATARACT EXTRACTION W/PHACO Left 01/30/2015   Procedure: CATARACT EXTRACTION PHACO AND INTRAOCULAR LENS PLACEMENT (IOC);  Surgeon: Birder Robson, MD;  Location: [facility];  Service: Ophthalmology;  Laterality: Left;  [place] 00:47    CATARACT EXTRACTION W/PHACO Right 02/13/2015   Procedure: CATARACT EXTRACTION PHACO AND INTRAOCULAR LENS PLACEMENT (IOC);  Surgeon: Birder Robson, MD;  Location: [facility];  Service: Ophthalmology;  Laterality: Right;  cassette lot # [id number] H [place]  00:29.9 AP  20.7 CDE  6.20   COLONOSCOPY N/A 11/02/2014   Procedure: COLONOSCOPY;  Surgeon: Inda Castle, MD;  Location: [facility];  Service: Endoscopy;  Laterality: N/A;   CORONARY ANGIOPLASTY WITH STENT PLACEMENT Left 02/06/2011   Procedure: CORONARY ANGIOPLASTY WITH STENT PLACEMENT; Location: [place]; Surgeon: Katrine Coho, MD   CORONARY ANGIOPLASTY WITH STENT PLACEMENT Left 09/17/2011   Procedure: CORONARY ANGIOPLASTY WITH STENT PLACEMENT; Location: [place]; Surgeon: Katrine Coho, MD   CORONARY ANGIOPLASTY WITH STENT PLACEMENT Left 10/02/2011   Procedure: CORONARY ANGIOPLASTY WITH STENT PLACEMENT; Location: [place]; Surgeon: Katrine Coho, MD   CORONARY ANGIOPLASTY WITH STENT PLACEMENT Left 08/19/2012   Procedure: CORONARY ANGIOPLASTY  WITH STENT PLACEMENT; Location: [place]; Surgeon: Kathlyn Sacramento, MD   ESOPHAGOGASTRODUODENOSCOPY (EGD) WITH PROPOFOL N/A 09/21/2018   Procedure: ESOPHAGOGASTRODUODENOSCOPY (EGD) WITH PROPOFOL;  Surgeon: [name], MD;  Location: [facility];  Service: Gastroenterology;  Laterality: N/A;   LEFT HEART CATH AND CORONARY ANGIOGRAPHY Left 08/02/2014   Procedure: LEFT HEART CATH AND CORONARY ANGIOGRAPHY; Location: [facility]; Surgeon: Glenetta Hew, MD   PERIPHERAL VASCULAR ATHERECTOMY Left 09/12/2020   Procedure: PERIPHERAL VASCULAR ATHERECTOMY;  Surgeon: Wellington Hampshire, MD;  Location: [place] [facility];  Service: Cardiovascular;  Laterality: Left;   PERIPHERAL VASCULAR BALLOON ANGIOPLASTY  09/12/2020   Procedure: PERIPHERAL VASCULAR BALLOON ANGIOPLASTY;  Surgeon: Wellington Hampshire, MD;  Location: [place] [facility];  Service: Cardiovascular;;   REVERSE SHOULDER ARTHROPLASTY Left 10/01/2021   Procedure: REVERSE SHOULDER ARTHROPLASTY WITH BICEPS TENODESIS.;  Surgeon: Corky Mull, MD;  Location: [facility];  Service: Orthopedics;  Laterality: Left;   SHOULDER SURGERY Left 2017   Social History:  reports that she has been smoking cigarettes. She has a 45.00 pack-year smoking history. She has never used smokeless  tobacco. She reports current drug use. Drug: Marijuana. She reports that she does not drink alcohol.  No Known Allergies  Family History  Problem Relation Age of Onset   Heart attack Mother        First MI @ 44 - Died @ 31   Heart disease Mother    Heart disease Father        Died @ 43   Throat cancer Brother    Liver cancer Brother    Colon cancer Sister     Prior to Admission medications   Medication Sig Start Date End Date Taking? Authorizing Provider  albuterol (VENTOLIN HFA) 108 (90 Base) MCG/ACT inhaler Inhale 2 puffs into the lungs every 6 (six) hours as needed for wheezing or shortness of breath. 01/14/22   [name], NP  amitriptyline (ELAVIL) 50 MG tablet Take  1 tablet (50 mg total) by mouth at bedtime as needed for sleep. 07/09/21 08/28/22  Gillis Santa, MD  carvedilol (COREG) 3.125 MG tablet Take 1 tablet (3.125 mg total) by mouth 2 (two) times daily. 02/17/22   Dunn, Areta Haber, PA-C  clobetasol cream (TEMOVATE) [id number] % Apply 1 Application topically 2 (two) times daily as needed. For vaginal itching. 04/16/22   [name], NP  clopidogrel (PLAVIX) 75 MG tablet Take 1 tablet (75 mg total) by mouth daily. 02/17/22   Rise Mu, PA-C  Continuous Blood Gluc Receiver (FREESTYLE LIBRE 2 READER) DEVI Use with sensor to check blood sugars 6 times daily. 02/16/21   [name], NP  Continuous Blood Gluc Sensor (FREESTYLE LIBRE 2 SENSOR) MISC USE TO CHECK SUGARS 6 TIMES DAILY - CHANGE OUT EVERY 14 DAYS Patient not taking: Reported on 09/02/2022 05/16/22   [name], NP  enalapril (VASOTEC) 10 MG tablet Take 1 tablet (10 mg total) by mouth daily. 02/17/22   Rise Mu, PA-C  ezetimibe (ZETIA) 10 MG tablet Take 1 tablet (10 mg total) by mouth daily. For cholesterol. 04/12/21   [name] D, PA-C  furosemide (LASIX) 20 MG tablet Take 1 tablet (20 mg total) by mouth as needed (As needed for weight gain greater than 3 pounds overnight or increased shortness of breath.). Take one tab only on Monday, Wednesday, & Fridays 08/29/22   Rise Mu, PA-C  gabapentin (NEURONTIN) 600 MG tablet Take 1 tablet (600 mg total) by mouth every 12 (twelve) hours. 08/06/22   Gillis Santa, MD  glipiZIDE (GLUCOTROL XL) 10 MG 24 hr tablet Take 10 mg by mouth daily with breakfast.    [provider]  glucose blood (FREESTYLE LITE) test strip Test up to four times a day as needed. DX 11/27/20   [name], NP  insulin aspart (NOVOLOG FLEXPEN) 100 UNIT/ML FlexPen Inject 18 units into the skin with breakfast, 14 units with lunch, and 16 units with dinner for diabetes. 07/31/22   [name], NP  insulin glargine (LANTUS) 100 UNIT/ML Solostar Pen Inject  8 Units into the skin at bedtime. Patient not taking: Reported on 09/02/2022    [provider]  Insulin Pen Needle (INSUPEN PEN NEEDLES) 32G X 4 MM MISC Use to inject insulin 4 times daily. 07/17/20   [name], NP  levothyroxine (SYNTHROID) 100 MCG tablet Take 100 mcg by mouth daily before breakfast.    [provider]  mirtazapine (REMERON) 7.5 MG tablet Take 1 tablet (7.5 mg total) by mouth at bedtime. For appetite and sleep. 10/11/21   Carlis Abbott,  Leticia Penna, NP  ondansetron (ZOFRAN) 4 MG tablet Take 1 tablet (4 mg total) by mouth every 6 (six) hours as needed for nausea. 10/03/21   Lattie Corns, PA-C  potassium chloride (KLOR-CON M) 10 MEQ tablet Only take on days you take your lasix. Take 1 tablet (10 mEq total) by mouth for 1 dose on those days you take your lasix 51m pill. 02/17/22   Dunn, RAreta Haber PA-C  rosuvastatin (CRESTOR) 40 MG tablet Take 1 tablet (40 mg total) by mouth every evening. For cholesterol. 02/17/22   DRise Mu PA-C  traZODone (DESYREL) 100 MG tablet TAKE 1 TABLET BY MOUTH AT BEDTIME AS NEEDED FOR SLEEP 04/10/21   CPleas Koch NP    Physical Exam: Vitals:   09/03/22 1806 09/03/22 1807 09/03/22 2309  BP:  (!) 140/76 111/60  Pulse:  68 65  Resp:  16 18  Temp:  97.6 F (36.4 C) 97.8 F (36.6 C)  TempSrc:  Oral Oral  SpO2:  100% 94%  Weight: 60.3 kg    Height: 5' 6"$  (1.676 m)     Physical Exam Vitals and nursing note reviewed.  Constitutional:      General: She is not in acute distress.    Comments: Chronically ill-appearing female  HENT:     Head: Normocephalic and atraumatic.  Cardiovascular:     Rate and Rhythm: Normal rate and regular rhythm.     Heart sounds: Normal heart sounds.  Pulmonary:     Effort: Pulmonary effort is normal.     Breath sounds: Normal breath sounds.  Abdominal:     Palpations: Abdomen is soft.     Tenderness: There is no abdominal tenderness.  Neurological:     Mental Status: Mental status is  at baseline.     Labs on Admission: I have personally reviewed following labs and imaging studies  CBC: Recent Labs  Lab 09/02/22 1545 09/03/22 1812  WBC 8.7 10.1  NEUTROABS 5.4  --   HGB 15.0 14.5  HCT 44.3 44.0  MCV 99.5 98.2  PLT 286.0 20000000  Basic Metabolic Panel: Recent Labs  Lab 08/28/22 1509 09/02/22 1545 09/03/22 1812  NA 132* 136 138  K 4.0 4.7 3.9  CL 94* 99 100  CO2 28 28 27  $ GLUCOSE 301* 158* 65*  BUN 28* 35* 40*  CREATININE 1.95* 2.39* 2.32*  CALCIUM 9.4 9.2 9.1   GFR: Estimated Creatinine Clearance: 19.9 mL/min (A) (by C-G formula based on SCr of 2.32 mg/dL (H)). Liver Function Tests: Recent Labs  Lab 08/28/22 1509 09/03/22 2108  AST 29 129*  ALT 24 125*  ALKPHOS 81 83  BILITOT 0.7 0.6  PROT 7.9 6.5  ALBUMIN 4.4 3.7   Recent Labs  Lab 09/03/22 2108  LIPASE 52*   No results for input(s): "AMMONIA" in the last 168 hours. Coagulation Profile: No results for input(s): "INR", "PROTIME" in the last 168 hours. Cardiac Enzymes: No results for input(s): "CKTOTAL", "CKMB", "CKMBINDEX", "TROPONINI" in the last 168 hours. BNP (last 3 results) No results for input(s): "PROBNP" in the last 8760 hours. HbA1C: No results for input(s): "HGBA1C" in the last 72 hours. CBG: Recent Labs  Lab 09/03/22 1816  GLUCAP 81   Lipid Profile: No results for input(s): "CHOL", "HDL", "LDLCALC", "TRIG", "CHOLHDL", "LDLDIRECT" in the last 72 hours. Thyroid Function Tests: No results for input(s): "TSH", "T4TOTAL", "FREET4", "T3FREE", "THYROIDAB" in the last 72 hours. Anemia Panel: No results for input(s): "VITAMINB12", "FOLATE", "FERRITIN", "TIBC", "IRON", "  RETICCTPCT" in the last 72 hours. Urine analysis:    Component Value Date/Time   COLORURINE YELLOW (A) 09/03/2022 2108   APPEARANCEUR CLEAR (A) 09/03/2022 2108   APPEARANCEUR Clear 06/12/2014 1900   LABSPEC 1.012 09/03/2022 2108   LABSPEC 1.010 06/12/2014 1900   PHURINE 5.0 09/03/2022 2108   GLUCOSEU 50  (A) 09/03/2022 2108   GLUCOSEU Negative 06/12/2014 1900   HGBUR SMALL (A) 09/03/2022 2108   BILIRUBINUR NEGATIVE 09/03/2022 2108   BILIRUBINUR negative 04/16/2022 1152   BILIRUBINUR Negative 06/12/2014 1900   KETONESUR NEGATIVE 09/03/2022 2108   PROTEINUR 30 (A) 09/03/2022 2108   UROBILINOGEN 0.2 04/16/2022 1152   NITRITE NEGATIVE 09/03/2022 2108   LEUKOCYTESUR TRACE (A) 09/03/2022 2108   LEUKOCYTESUR 1+ 06/12/2014 1900    Radiological Exams on Admission: CT CHEST ABDOMEN PELVIS WO CONTRAST  Result Date: 09/03/2022 CLINICAL DATA:  Flank pain. Acute kidney injury. To rule out obstructive. Dizziness and nausea since last week. Back pain and shortness of breath. EXAM: CT CHEST, ABDOMEN AND PELVIS WITHOUT CONTRAST TECHNIQUE: Multidetector CT imaging of the chest, abdomen and pelvis was performed following the standard protocol without IV contrast. RADIATION DOSE REDUCTION: This exam was performed according to the departmental dose-optimization program which includes automated exposure control, adjustment of the mA and/or kV according to patient size and/or use of iterative reconstruction technique. COMPARISON:  CT chest 05/13/2021.  CT abdomen and pelvis 02/06/2016 FINDINGS: CT CHEST FINDINGS Cardiovascular: Normal heart size. No pericardial effusions. Normal caliber thoracic aorta. Calcification in the aorta and coronary arteries. Mediastinum/Nodes: Esophagus is decompressed. No significant lymphadenopathy. Thyroid gland is unremarkable. Lungs/Pleura: Lungs are clear. No pleural effusions. No pneumothorax. Musculoskeletal: Postoperative left shoulder replacement. Degenerative changes in the spine. CT ABDOMEN PELVIS FINDINGS Hepatobiliary: No focal liver abnormality is seen. No gallstones, gallbladder wall thickening, or biliary dilatation. Pancreas: Unremarkable. No pancreatic ductal dilatation or surrounding inflammatory changes. Spleen: Normal in size without focal abnormality. Adrenals/Urinary  Tract: No adrenal gland nodules. Left renal cysts measuring up to 2.8 cm diameter, unchanged since prior study. No imaging follow-up is indicated. No hydronephrosis or hydroureter. Central renal calcifications appear to be vascular calcification. No renal or ureteral stone identified. Bladder is normal. Stomach/Bowel: Stomach is mildly distended and filled with ingested material, likely physiologic. No gastric wall thickening is appreciated. Small duodenal diverticulum. Small bowel and colon are not abnormally distended. No wall thickening or inflammatory changes are appreciated. Colonic diverticulosis without evidence of acute diverticulitis. Appendix is not identified. Vascular/Lymphatic: Aortic atherosclerosis. No enlarged abdominal or pelvic lymph nodes. Reproductive: Status post hysterectomy. No adnexal masses. Other: No abdominal wall hernia or abnormality. No abdominopelvic ascites. Musculoskeletal: Postoperative lumbar fixation from L3 to the sacrum. Degenerative changes in the spine and hips. IMPRESSION: 1. No acute process demonstrated in the chest, abdomen, or pelvis. 2. Aortic atherosclerosis. Electronically Signed   By: Lucienne Capers M.D.   On: 09/03/2022 20:34   CT Head Wo Contrast  Result Date: 09/03/2022 CLINICAL DATA:  Vertigo EXAM: CT HEAD WITHOUT CONTRAST TECHNIQUE: Contiguous axial images were obtained from the base of the skull through the vertex without intravenous contrast. RADIATION DOSE REDUCTION: This exam was performed according to the departmental dose-optimization program which includes automated exposure control, adjustment of the mA and/or kV according to patient size and/or use of iterative reconstruction technique. COMPARISON:  None Available. FINDINGS: Brain: No acute territorial infarction, hemorrhage or intracranial mass. Moderate white matter hypodensity consistent with chronic small vessel disease. Normal ventricle size. Vascular: No hyperdense vessels.  Carotid  vascular  calcification Skull: Normal. Negative for fracture or focal lesion. Sinuses/Orbits: No acute finding. Other: None IMPRESSION: 1. No CT evidence for acute intracranial abnormality. 2. 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 Electronically Signed   By: Donavan Foil M.D.   On: 09/03/2022 18:51     Data Reviewed: Relevant notes from primary care and specialist visits, past discharge summaries as available in EHR, including Care Everywhere. Prior diagnostic testing as pertinent to current admission diagnoses Updated medications and problem lists for reconciliation ED course, including vitals, labs, imaging, treatment and response to treatment Triage notes, nursing and pharmacy notes and ED provider's notes Notable results as noted in HPI   Assessment and Plan: * Acute renal failure superimposed on stage 3b chronic kidney disease (HCC) Inadequate oral intake Suspect prerenal AKI related to poor oral intake but from chronic nausea IV hydration with monitoring for fluid overload in view of diagnosis of CHF Avoid nephrotoxins. Hold home enalapril 10, Lasix 20 IV hydration and monitor renal function Consider renal consult if worsening  Orthostatic hypotension Chronic dizziness Patient has been followed by cardiology in the past for dizzy episodes which are felt possibly related to orthostatic hypotension versus vertigo.  She underwent extensive cardiac workup in 2022 including echo, cardiac monitoring with Zio patch, carotid Doppler with no significant abnormal finding -Fall precautions  Transaminitis Elevated transaminases, uncertain etiology Will hydrate and monitor Will send off acute hepatitis panel  Uncontrolled type 2 diabetes mellitus with hypoglycemia, with long-term current use of insulin (HCC) Blood sugar 65 Holding home oral hypoglycemics and insulin's Sliding scale coverage  Chronic diastolic CHF (congestive heart failure) ([place]) Appears compensated.   Patient appears dry Monitor for worsening in view of IV hydration Holding enalapril and Lasix due to suspected dehydration Continue Coreg  CAD S/P percutaneous coronary angioplasty No complaints of chest pain and EKG nonacute Continue Coreg, aspirin and rosuvastatin  COPD (chronic obstructive pulmonary disease) (HCC) Not acutely exacerbated Ventolin as needed  Chronic bilateral back pain Continue gabapentin  Adjustment disorder with mixed anxiety and depressed mood Continue trazodone, Remeron and Elavil  Obstructive sleep apnea CPAP nightly if desired  Hypothyroidism Continue levothyroxine      DVT prophylaxis: Lovenox  Consults: none  Advance Care Planning:   Code Status: Prior   Family Communication: none  Disposition Plan: Back to previous home environment  Severity of Illness: The appropriate patient status for this patient is INPATIENT. Inpatient status is judged to be reasonable and necessary in order to provide the required intensity of service to ensure the patient's safety. The patient's presenting symptoms, physical exam findings, and initial radiographic and laboratory data in the context of their chronic comorbidities is felt to place them at high risk for further clinical deterioration. Furthermore, it is not anticipated that the patient will be medically stable for discharge from the hospital within 2 midnights of admission.   * I certify that at the point of admission it is my clinical judgment that the patient will require inpatient hospital care spanning beyond 2 midnights from the point of admission due to high intensity of service, high risk for further deterioration and high frequency of surveillance required.*  Author: Athena Masse, MD 09/03/2022 11:29 PM  For on call review [email or web address].

## 2022-09-03 NOTE — ED Triage Notes (Signed)
Pt has been having dizziness and nausea since last week. Pt sent from PCP due to Cr 2.36. Endorses back soreness and SOB.

## 2022-09-03 NOTE — ED Provider Notes (Addendum)
[facility] Provider Note    Event Date/Time   First MD Initiated Contact with Patient 09/03/22 2000     (approximate)   History   Nausea and Dizziness   HPI  Sarah Payne is a 77 y.o. female   Past medical history of CHF, COPD, CAD, hypertension, hyperlipidemia, hypothyroid, presents emergency department with 1 week of generalized fatigue, weakness, poor p.o. intake, nausea but no vomiting.  She has had profound lightheadedness upon standing.  No lightheadedness when seated, none currently when I interviewed her while she is in the stretcher.  Denies chest pain or respiratory infectious symptoms.  She was encouraged to come in to the emergency department because of poor renal function on outpatient lab testing by PMD.  Independent Historian contributed to assessment above: Daughter       Physical Exam   Triage Vital Signs: ED Triage Vitals  Enc Vitals Group     BP 09/03/22 1807 (!) 140/76     Pulse Rate 09/03/22 1807 68     Resp 09/03/22 1807 16     Temp 09/03/22 1807 97.6 F (36.4 C)     Temp Source 09/03/22 1807 Oral     SpO2 09/03/22 1807 100 %     Weight 09/03/22 1806 133 lb (60.3 kg)     Height 09/03/22 1806 5' 6"$  (1.676 m)     Head Circumference --      Peak Flow --      Pain Score 09/03/22 1821 7     Pain Loc --      Pain Edu? --      Excl. in [place]? --     Most recent vital signs: Vitals:   09/03/22 1807  BP: (!) 140/76  Pulse: 68  Resp: 16  Temp: 97.6 F (36.4 C)  SpO2: 100%    General: Awake, no distress.  CV:  Good peripheral perfusion.  Resp:  Normal effort.  Abd:  No distention.  Other:  Awake alert oriented no acute distress nontoxic appearance.  Hemodynamics significant for mild hypertension otherwise normal.  She appears clinically dehydrated with dry mucous membranes poor skin turgor and her lungs are clear to auscultation without rales.  She has no peripheral edema.  Abdomen is soft and nontender.   ED  Results / Procedures / Treatments   Labs (all labs ordered are listed, but only abnormal results are displayed) Labs Reviewed  BASIC METABOLIC PANEL - Abnormal; Notable for the following components:      Result Value   Glucose, Bld 65 (*)    BUN 40 (*)    Creatinine, Ser 2.32 (*)    GFR, Estimated 22 (*)    All other components within normal limits  URINALYSIS, ROUTINE W REFLEX MICROSCOPIC - Abnormal; Notable for the following components:   Color, Urine YELLOW (*)    APPearance CLEAR (*)    Glucose, UA 50 (*)    Hgb urine dipstick SMALL (*)    Protein, ur 30 (*)    Leukocytes,Ua TRACE (*)    Bacteria, UA RARE (*)    All other components within normal limits  LIPASE, BLOOD - Abnormal; Notable for the following components:   Lipase 52 (*)    All other components within normal limits  HEPATIC FUNCTION PANEL - Abnormal; Notable for the following components:   AST 129 (*)    ALT 125 (*)    All other components within normal limits  RESP PANEL BY RT-PCR (RSV,  FLU A&B, COVID)  [id number]  CBC  BRAIN NATRIURETIC PEPTIDE  ETHANOL  CBG MONITORING, ED  CBG MONITORING, ED  TROPONIN I (HIGH SENSITIVITY)  TROPONIN I (HIGH SENSITIVITY)     I ordered and reviewed the above labs they are notable for fattening of 2.3, elevated from the ones prior.  EKG  ED ECG REPORT I, Lucillie Garfinkel, the attending physician, personally viewed and interpreted this ECG.   Date: 09/03/2022  EKG Time: 1820  Rate: 66  Rhythm: nsr  Axis: nl  Intervals:lbbb  ST&T Change: no stemi    RADIOLOGY I independently reviewed and interpreted CT head see no obvious bleeding or midline shift   PROCEDURES:  Critical Care performed: No  Procedures   MEDICATIONS ORDERED IN ED: Medications  sodium chloride 0.9 % bolus 1,000 mL (has no administration in time range)    External physician / consultants:  I spoke with hospitalist regarding care plan for this patient.   IMPRESSION / MDM / ASSESSMENT AND PLAN  / ED COURSE  I reviewed the triage vital signs and the nursing notes.                                Patient's presentation is most consistent with acute presentation with potential threat to life or bodily function.  Differential diagnosis includes, but is not limited to, kidney injury due to dehydration, heart failure exacerbation, obstructive uropathy, orthostatic hypotension, infection, electrolyte derangements   The patient is on the cardiac monitor to evaluate for evidence of arrhythmia and/or significant heart rate changes.  MDM: Patient appears dehydrated with AKI.  I gave IV crystalloid bolus.  Initially worked her up for CHF exacerbation including chest x-ray and BNP which were both normal.  CT scan of the chest abdomen pelvis as well did not reveal any intra-abdominal pathology or obstructive uropathy or kidney stone.  Patient has profound lightheadedness upon standing, and acute kidney injury, will admit for fluids and recheck of kidney function and symptoms prior to discharge home.   LFTs are elevated.  Her hepatobiliary imaging was negative on CT scan.  She has no tenderness in her abdomen.        FINAL CLINICAL IMPRESSION(S) / ED DIAGNOSES   Final diagnoses:  AKI (acute kidney injury) ([place])  Orthostatic lightheadedness     Rx / DC Orders   ED Discharge Orders     None        Note:  This document was prepared using Dragon voice recognition software and may include unintentional dictation errors.    Lucillie Garfinkel, MD 09/03/22 [id number]    Lucillie Garfinkel, MD 09/03/22 [phone]

## 2022-09-04 ENCOUNTER — Encounter: Payer: Self-pay | Admitting: Internal Medicine

## 2022-09-04 ENCOUNTER — Other Ambulatory Visit: Payer: Self-pay

## 2022-09-04 DIAGNOSIS — I2510 Atherosclerotic heart disease of native coronary artery without angina pectoris: Secondary | ICD-10-CM

## 2022-09-04 DIAGNOSIS — E039 Hypothyroidism, unspecified: Secondary | ICD-10-CM

## 2022-09-04 DIAGNOSIS — I5032 Chronic diastolic (congestive) heart failure: Secondary | ICD-10-CM

## 2022-09-04 DIAGNOSIS — E11649 Type 2 diabetes mellitus with hypoglycemia without coma: Secondary | ICD-10-CM

## 2022-09-04 DIAGNOSIS — I10 Essential (primary) hypertension: Secondary | ICD-10-CM | POA: Diagnosis not present

## 2022-09-04 DIAGNOSIS — R7401 Elevation of levels of liver transaminase levels: Secondary | ICD-10-CM | POA: Diagnosis not present

## 2022-09-04 DIAGNOSIS — Z794 Long term (current) use of insulin: Secondary | ICD-10-CM

## 2022-09-04 DIAGNOSIS — N179 Acute kidney failure, unspecified: Secondary | ICD-10-CM | POA: Diagnosis not present

## 2022-09-04 DIAGNOSIS — J439 Emphysema, unspecified: Secondary | ICD-10-CM | POA: Diagnosis not present

## 2022-09-04 DIAGNOSIS — N170 Acute kidney failure with tubular necrosis: Secondary | ICD-10-CM

## 2022-09-04 DIAGNOSIS — N1832 Chronic kidney disease, stage 3b: Secondary | ICD-10-CM | POA: Diagnosis not present

## 2022-09-04 DIAGNOSIS — Z9861 Coronary angioplasty status: Secondary | ICD-10-CM

## 2022-09-04 LAB — CBC
HCT: 39 % (ref 36.0–46.0)
HCT: 38.7 % (ref 36.0–46.0)
Hemoglobin: 13 g/dL (ref 12.0–15.0)
Hemoglobin: 12.7 g/dL (ref 12.0–15.0)
MCH: 32.6 pg (ref 26.0–34.0)
MCH: 32.3 pg (ref 26.0–34.0)
MCHC: 33.3 g/dL (ref 30.0–36.0)
MCHC: 32.8 g/dL (ref 30.0–36.0)
MCV: 97.7 fL (ref 80.0–100.0)
MCV: 98.5 fL (ref 80.0–100.0)
Platelets: 215 10*3/uL (ref 150–400)
Platelets: 192 10*3/uL (ref 150–400)
RBC: 3.99 MIL/uL (ref 3.87–5.11)
RBC: 3.93 MIL/uL (ref 3.87–5.11)
RDW: 12.9 % (ref 11.5–15.5)
RDW: 13 % (ref 11.5–15.5)
WBC: 9.4 10*3/uL (ref 4.0–10.5)
WBC: 7.3 10*3/uL (ref 4.0–10.5)
nRBC: 0 % (ref 0.0–0.2)
nRBC: 0 % (ref 0.0–0.2)

## 2022-09-04 LAB — COMPREHENSIVE METABOLIC PANEL
ALT: 125 U/L — ABNORMAL HIGH (ref 0–44)
AST: 125 U/L — ABNORMAL HIGH (ref 15–41)
Albumin: 3.3 g/dL — ABNORMAL LOW (ref 3.5–5.0)
Alkaline Phosphatase: 71 U/L (ref 38–126)
Anion gap: 6 (ref 5–15)
BUN: 34 mg/dL — ABNORMAL HIGH (ref 8–23)
CO2: 23 mmol/L (ref 22–32)
Calcium: 8.1 mg/dL — ABNORMAL LOW (ref 8.9–10.3)
Chloride: 105 mmol/L (ref 98–111)
Creatinine, Ser: 1.89 mg/dL — ABNORMAL HIGH (ref 0.44–1.00)
GFR, Estimated: 28 mL/min — ABNORMAL LOW (ref >60)
Glucose, Bld: 161 mg/dL — ABNORMAL HIGH (ref 70–99)
Potassium: 4.7 mmol/L (ref 3.5–5.1)
Sodium: 134 mmol/L — ABNORMAL LOW (ref 135–145)
Total Bilirubin: 0.5 mg/dL (ref 0.3–1.2)
Total Protein: 5.8 g/dL — ABNORMAL LOW (ref 6.5–8.1)

## 2022-09-04 LAB — HEPATITIS PANEL, ACUTE
HCV Ab: NONREACTIVE
Hep A IgM: NONREACTIVE
Hep B C IgM: NONREACTIVE
Hepatitis B Surface Ag: NONREACTIVE

## 2022-09-04 LAB — CREATININE, SERUM
Creatinine, Ser: 1.97 mg/dL — ABNORMAL HIGH (ref 0.44–1.00)
GFR, Estimated: 26 mL/min — ABNORMAL LOW (ref >60)

## 2022-09-04 LAB — CBG MONITORING, ED
Glucose-Capillary: 104 mg/dL — ABNORMAL HIGH (ref 70–99)
Glucose-Capillary: 151 mg/dL — ABNORMAL HIGH (ref 70–99)

## 2022-09-04 MED ORDER — ENOXAPARIN SODIUM 30 MG/0.3ML IJ SOSY
30.0000 mg | PREFILLED_SYRINGE | INTRAMUSCULAR | Status: DC
  Administered 2022-09-04: 30 mg via SUBCUTANEOUS
  Filled 2022-09-04: qty 0.3

## 2022-09-04 MED ORDER — NICOTINE 21 MG/24HR TD PT24
21.0000 mg | MEDICATED_PATCH | Freq: Every day | TRANSDERMAL | Status: DC
  Administered 2022-09-04: 21 mg via TRANSDERMAL
  Filled 2022-09-04: qty 1

## 2022-09-04 MED ORDER — LACTATED RINGERS IV SOLN: INTRAVENOUS | Status: AC

## 2022-09-04 MED ORDER — ONDANSETRON HCL 4 MG/2ML IJ SOLN: 4.0000 mg | Freq: Four times a day (QID) | INTRAMUSCULAR | Status: DC | PRN

## 2022-09-04 MED ORDER — UMECLIDINIUM-VILANTEROL 62.5-25 MCG/ACT IN AEPB: 1.0000 | INHALATION_SPRAY | Freq: Every day | RESPIRATORY_TRACT | 0 refills | Status: DC

## 2022-09-04 MED ORDER — MIRTAZAPINE 15 MG PO TABS
7.5000 mg | ORAL_TABLET | Freq: Every day | ORAL | Status: DC
  Administered 2022-09-04: 7.5 mg via ORAL
  Filled 2022-09-04: qty 1

## 2022-09-04 MED ORDER — NOVOLOG FLEXPEN 100 UNIT/ML ~~LOC~~ SOPN: PEN_INJECTOR | SUBCUTANEOUS | 0 refills | Status: DC

## 2022-09-04 MED ORDER — ACETAMINOPHEN 325 MG RE SUPP: 650.0000 mg | Freq: Four times a day (QID) | RECTAL | Status: DC | PRN

## 2022-09-04 MED ORDER — GABAPENTIN 300 MG PO CAPS
600.0000 mg | ORAL_CAPSULE | Freq: Two times a day (BID) | ORAL | Status: DC
  Administered 2022-09-04 (×2): 600 mg via ORAL
  Filled 2022-09-04 (×2): qty 2

## 2022-09-04 MED ORDER — INSULIN ASPART 100 UNIT/ML IJ SOLN: 0.0000 [IU] | Freq: Every day | INTRAMUSCULAR | Status: DC

## 2022-09-04 MED ORDER — ACETAMINOPHEN 325 MG PO TABS: 650.0000 mg | ORAL_TABLET | Freq: Four times a day (QID) | ORAL | Status: DC | PRN

## 2022-09-04 MED ORDER — CLOPIDOGREL BISULFATE 75 MG PO TABS
75.0000 mg | ORAL_TABLET | Freq: Every day | ORAL | Status: DC
  Administered 2022-09-04: 75 mg via ORAL
  Filled 2022-09-04: qty 1

## 2022-09-04 MED ORDER — TRAZODONE HCL 100 MG PO TABS
100.0000 mg | ORAL_TABLET | Freq: Every evening | ORAL | Status: DC | PRN
  Administered 2022-09-04: 100 mg via ORAL
  Filled 2022-09-04: qty 1

## 2022-09-04 MED ORDER — CARVEDILOL 6.25 MG PO TABS
3.1250 mg | ORAL_TABLET | Freq: Two times a day (BID) | ORAL | Status: DC
  Administered 2022-09-04: 3.125 mg via ORAL
  Filled 2022-09-04: qty 1

## 2022-09-04 MED ORDER — HYDROCODONE-ACETAMINOPHEN 5-325 MG PO TABS
1.0000 | ORAL_TABLET | ORAL | Status: DC | PRN
  Administered 2022-09-04: 2 via ORAL
  Filled 2022-09-04: qty 2

## 2022-09-04 MED ORDER — ROSUVASTATIN CALCIUM 20 MG PO TABS
40.0000 mg | ORAL_TABLET | Freq: Every evening | ORAL | Status: DC
  Administered 2022-09-04: 40 mg via ORAL
  Filled 2022-09-04: qty 2

## 2022-09-04 MED ORDER — INSULIN ASPART 100 UNIT/ML IJ SOLN
0.0000 [IU] | Freq: Three times a day (TID) | INTRAMUSCULAR | Status: DC
  Filled 2022-09-04: qty 1

## 2022-09-04 MED ORDER — ONDANSETRON HCL 4 MG PO TABS: 4.0000 mg | ORAL_TABLET | Freq: Four times a day (QID) | ORAL | Status: DC | PRN

## 2022-09-04 MED ORDER — POTASSIUM CHLORIDE CRYS ER 20 MEQ PO TBCR
10.0000 meq | EXTENDED_RELEASE_TABLET | Freq: Every day | ORAL | Status: DC
  Administered 2022-09-04: 10 meq via ORAL
  Filled 2022-09-04: qty 1

## 2022-09-04 MED ORDER — AMITRIPTYLINE HCL 50 MG PO TABS: 50.0000 mg | ORAL_TABLET | Freq: Every evening | ORAL | Status: DC | PRN

## 2022-09-04 MED ORDER — LEVOTHYROXINE SODIUM 50 MCG PO TABS
100.0000 ug | ORAL_TABLET | Freq: Every day | ORAL | Status: DC
  Administered 2022-09-04: 100 ug via ORAL
  Filled 2022-09-04: qty 2

## 2022-09-04 MED ORDER — EZETIMIBE 10 MG PO TABS
10.0000 mg | ORAL_TABLET | Freq: Every day | ORAL | Status: DC
  Administered 2022-09-04: 10 mg via ORAL
  Filled 2022-09-04: qty 1

## 2022-09-04 NOTE — Assessment & Plan Note (Addendum)
Hepatitis profile negative.  Hold cholesterol medications.  CT scan of the chest abdomen and pelvis did not show any acute process but this was done without contrast.

## 2022-09-04 NOTE — Assessment & Plan Note (Signed)
CPAP nightly if desired °

## 2022-09-04 NOTE — Assessment & Plan Note (Signed)
Continue levothyroxine 

## 2022-09-04 NOTE — Assessment & Plan Note (Addendum)
Continue trazodone, Remeron and Elavil

## 2022-09-04 NOTE — Assessment & Plan Note (Addendum)
Holding enalapril and Lasix in the hospital. Continue Coreg

## 2022-09-04 NOTE — ED Notes (Signed)
Pt given breakfast tray

## 2022-09-04 NOTE — Discharge Summary (Signed)
Physician Discharge Summary   Patient: Sarah Payne MRN: [id number] DOB: 18-Nov-1945  Admit date:     09/03/2022  Discharge date: 09/04/22  Discharge Physician: Loletha Grayer   PCP: [name], NP   Recommendations at discharge:   Follow-up PCP 5 days  Discharge Diagnoses: Principal Problem:   Acute renal failure superimposed on stage 3b chronic kidney disease (HCC) Active Problems:   Orthostatic hypotension   Dizziness   Inadequate oral intake   Uncontrolled type 2 diabetes mellitus with hypoglycemia, with long-term current use of insulin (HCC)   Transaminitis   Chronic diastolic CHF (congestive heart failure) (HCC)   CAD S/P percutaneous coronary angioplasty   COPD (chronic obstructive pulmonary disease) (HCC)   Hypothyroidism   Obstructive sleep apnea   Adjustment disorder with mixed anxiety and depressed mood   HTN (hypertension)   Chronic bilateral back pain    Hospital Course: 77 y.o. female with medical history significant for CAD s/p PCI, HFpEF, PAD status post orbital atherectomy and  balloon angioplasty to the left SFA in 08/2020, DM2, CKD stage IIIb, HTN, HLD, COPD, hypothyroidism, orthostatic dizziness versus vertigo, and sleep apnea who presents to the ED with worsening renal function and worsening of her chronic dizziness.  Patient has been followed by cardiology in the past for dizzy episodes which are felt possibly related to orthostatic hypotension versus vertigo.  She underwent extensive cardiac workup in 2022 including echo, cardiac monitoring with Zio patch, carotid Doppler with no significant abnormal finding.  Over the past week symptoms have been worse and patient has had decreased oral intake and nausea with occasional vomiting.  She denies abdominal pain, diarrhea, fever or chills.  She does endorse mild shortness of breath ED course and data review: BP 140/76 with otherwise normal vitals.  Labs notable for normal CBC2,, BMP with BUN/creatinine  40/2.32.  Above most recent creatinine of 1.62 about 11 months prior.  Blood sugar 65.  Lipase 52, with transaminitis of AST 129 and ALT 125.  Troponin and BNP normal, alcohol level less than 0.10.  Urinalysis not consistent with UTI. EKG G personally viewed and interpreted showing NSR at 66 with nonspecific ST-T wave changes. CT head nonacute CT chest abdomen pelvis without contrast nonacute Patient given an NS bolus and hospitalist consulted for admission.   Patient seen on 09/04/2022.  She was dressed and she states she is ready to go home.  Patient states that she would be better at home.  Her creatinine did improve from 2.32 down to 1.89.  Advised to hold the enalapril.  Advised to hold the glipizide.  Advised to hold the Crestor.  Patient does not take any long-acting insulin.  She uses short acting insulin prior to meals.   Assessment and Plan: * Acute renal failure superimposed on stage 3b chronic kidney disease (HCC) Inadequate oral intake Acute kidney injury on CKD stage IIIb.  Creatinine 2.32 on presentation.  With IV fluid hydration creatinine down to 1.89.  Held enalapril and Lasix.  Orthostatic hypotension Patient was not orthostatic this was ruled out.  Transaminitis Hepatitis profile negative.  Hold cholesterol medications.  CT scan of the chest abdomen and pelvis did not show any acute process but this was done without contrast.  Uncontrolled type 2 diabetes mellitus with hypoglycemia, with long-term current use of insulin (HCC) Hemoglobin A1c 8.4 on 07/31/2022.  Discontinue glipizide.  Patient only on short acting insulin.  Advised her to follow-up with her physician and may restart lower dose long-acting  insulin and cut back on the short acting insulin  Chronic diastolic CHF (congestive heart failure) (HCC) Holding enalapril and Lasix in the hospital. Continue Coreg  CAD S/P percutaneous coronary angioplasty No complaints of chest pain and EKG nonacute Continue Coreg,  aspirin. Hold Crestor with elevated liver function test.  COPD (chronic obstructive pulmonary disease) ([place]) With some wheeze will add Anoro inhaler. Ventolin as needed  Chronic bilateral back pain Continue gabapentin  Adjustment disorder with mixed anxiety and depressed mood Continue trazodone, Remeron and Elavil  Obstructive sleep apnea CPAP nightly if desired  Hypothyroidism Continue levothyroxine         Consultants: None Procedures performed: None Disposition: Home Diet recommendation:  Cardiac and Carb modified diet DISCHARGE MEDICATION: Allergies as of 09/04/2022   No Known Allergies      Medication List     STOP taking these medications    enalapril 10 MG tablet Commonly known as: VASOTEC   FreeStyle Libre 2 Sensor Misc   glipiZIDE 10 MG 24 hr tablet Commonly known as: GLUCOTROL XL   insulin glargine 100 UNIT/ML Solostar Pen Commonly known as: LANTUS   rosuvastatin 40 MG tablet Commonly known as: CRESTOR       TAKE these medications    albuterol 108 (90 Base) MCG/ACT inhaler Commonly known as: VENTOLIN HFA Inhale 2 puffs into the lungs every 6 (six) hours as needed for wheezing or shortness of breath.   amitriptyline 50 MG tablet Commonly known as: ELAVIL Take 1 tablet (50 mg total) by mouth at bedtime as needed for sleep.   carvedilol 3.125 MG tablet Commonly known as: COREG Take 1 tablet (3.125 mg total) by mouth 2 (two) times daily.   clobetasol cream 0.05 % Commonly known as: TEMOVATE Apply 1 Application topically 2 (two) times daily as needed. For vaginal itching.   clopidogrel 75 MG tablet Commonly known as: PLAVIX Take 1 tablet (75 mg total) by mouth daily.   ezetimibe 10 MG tablet Commonly known as: ZETIA Take 1 tablet (10 mg total) by mouth daily. For cholesterol.   FreeStyle Libre 2 Reader [employer] Use with sensor to check blood sugars 6 times daily.   FREESTYLE LITE test strip Generic drug: glucose blood Test up to  four times a day as needed. DX   furosemide 20 MG tablet Commonly known as: LASIX Take 1 tablet (20 mg total) by mouth as needed (As needed for weight gain greater than 3 pounds overnight or increased shortness of breath.). Take one tab only on Monday, Wednesday, & Fridays   gabapentin 600 MG tablet Commonly known as: Neurontin Take 1 tablet (600 mg total) by mouth every 12 (twelve) hours.   Insupen Pen Needles 32G X 4 MM Misc Generic drug: Insulin Pen Needle Use to inject insulin 4 times daily.   levothyroxine 100 MCG tablet Commonly known as: SYNTHROID Take 100 mcg by mouth daily before breakfast.   mirtazapine 7.5 MG tablet Commonly known as: REMERON Take 1 tablet (7.5 mg total) by mouth at bedtime. For appetite and sleep.   NovoLOG FlexPen 100 UNIT/ML FlexPen Generic drug: insulin aspart Inject 18 units into the skin with breakfast, 14 units with lunch, and 16 units with dinner for diabetes. Sliding scal   ondansetron 4 MG tablet Commonly known as: ZOFRAN Take 1 tablet (4 mg total) by mouth every 6 (six) hours as needed for nausea.   potassium chloride 10 MEQ tablet Commonly known as: KLOR-CON M Only take on days you take your lasix. Take  1 tablet (10 mEq total) by mouth for 1 dose on those days you take your lasix 77m pill.   traZODone 100 MG tablet Commonly known as: DESYREL TAKE 1 TABLET BY MOUTH AT BEDTIME AS NEEDED FOR SLEEP   umeclidinium-vilanterol 62.5-25 MCG/ACT Aepb Commonly known as: ANORO ELLIPTA Inhale 1 puff into the lungs daily.        Follow-up Information     CPleas Koch NP Follow up in 5 day(s).   Specialty: Internal Medicine Contact information: [id number] [phone]               Discharge Exam: FDanley DankerWeights   09/03/22 1806  Weight: 60.3 kg   Physical Exam HENT:     Head: Normocephalic.     Mouth/Throat:     Pharynx: No oropharyngeal exudate.  Eyes:     General: Lids are normal.      Conjunctiva/sclera: Conjunctivae normal.  Cardiovascular:     Rate and Rhythm: Normal rate and regular rhythm.     Heart sounds: Normal heart sounds, S1 normal and S2 normal.  Pulmonary:     Breath sounds: Examination of the right-lower field reveals decreased breath sounds and wheezing. Examination of the left-lower field reveals decreased breath sounds and wheezing. Decreased breath sounds and wheezing present. No rhonchi or rales.  Abdominal:     Palpations: Abdomen is soft.     Tenderness: There is no abdominal tenderness.  Musculoskeletal:     Right lower leg: No swelling.     Left lower leg: No swelling.  Skin:    General: Skin is warm.     Findings: No rash.  Neurological:     Mental Status: She is alert and oriented to person, place, and time.      Condition at discharge: stable  The results of significant diagnostics from this hospitalization (including imaging, microbiology, ancillary and laboratory) are listed below for reference.   Imaging Studies: CT CHEST ABDOMEN PELVIS WO CONTRAST  Result Date: 09/03/2022 CLINICAL DATA:  Flank pain. Acute kidney injury. To rule out obstructive. Dizziness and nausea since last week. Back pain and shortness of breath. EXAM: CT CHEST, ABDOMEN AND PELVIS WITHOUT CONTRAST TECHNIQUE: Multidetector CT imaging of the chest, abdomen and pelvis was performed following the standard protocol without IV contrast. RADIATION DOSE REDUCTION: This exam was performed according to the departmental dose-optimization program which includes automated exposure control, adjustment of the mA and/or kV according to patient size and/or use of iterative reconstruction technique. COMPARISON:  CT chest 05/13/2021.  CT abdomen and pelvis 02/06/2016 FINDINGS: CT CHEST FINDINGS Cardiovascular: Normal heart size. No pericardial effusions. Normal caliber thoracic aorta. Calcification in the aorta and coronary arteries. Mediastinum/Nodes: Esophagus is decompressed. No  significant lymphadenopathy. Thyroid gland is unremarkable. Lungs/Pleura: Lungs are clear. No pleural effusions. No pneumothorax. Musculoskeletal: Postoperative left shoulder replacement. Degenerative changes in the spine. CT ABDOMEN PELVIS FINDINGS Hepatobiliary: No focal liver abnormality is seen. No gallstones, gallbladder wall thickening, or biliary dilatation. Pancreas: Unremarkable. No pancreatic ductal dilatation or surrounding inflammatory changes. Spleen: Normal in size without focal abnormality. Adrenals/Urinary Tract: No adrenal gland nodules. Left renal cysts measuring up to 2.8 cm diameter, unchanged since prior study. No imaging follow-up is indicated. No hydronephrosis or hydroureter. Central renal calcifications appear to be vascular calcification. No renal or ureteral stone identified. Bladder is normal. Stomach/Bowel: Stomach is mildly distended and filled with ingested material, likely physiologic. No gastric wall thickening is appreciated. Small duodenal  diverticulum. Small bowel and colon are not abnormally distended. No wall thickening or inflammatory changes are appreciated. Colonic diverticulosis without evidence of acute diverticulitis. Appendix is not identified. Vascular/Lymphatic: Aortic atherosclerosis. No enlarged abdominal or pelvic lymph nodes. Reproductive: Status post hysterectomy. No adnexal masses. Other: No abdominal wall hernia or abnormality. No abdominopelvic ascites. Musculoskeletal: Postoperative lumbar fixation from L3 to the sacrum. Degenerative changes in the spine and hips. IMPRESSION: 1. No acute process demonstrated in the chest, abdomen, or pelvis. 2. Aortic atherosclerosis. Electronically Signed   By: Lucienne Capers M.D.   On: 09/03/2022 20:34   CT Head Wo Contrast  Result Date: 09/03/2022 CLINICAL DATA:  Vertigo EXAM: CT HEAD WITHOUT CONTRAST TECHNIQUE: Contiguous axial images were obtained from the base of the skull through the vertex without intravenous  contrast. RADIATION DOSE REDUCTION: This exam was performed according to the departmental dose-optimization program which includes automated exposure control, adjustment of the mA and/or kV according to patient size and/or use of iterative reconstruction technique. COMPARISON:  None Available. FINDINGS: Brain: No acute territorial infarction, hemorrhage or intracranial mass. Moderate white matter hypodensity consistent with chronic small vessel disease. Normal ventricle size. Vascular: No hyperdense vessels.  Carotid vascular calcification Skull: Normal. Negative for fracture or focal lesion. Sinuses/Orbits: No acute finding. Other: None IMPRESSION: 1. No CT evidence for acute intracranial abnormality. 2. 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 Electronically Signed   By: Donavan Foil M.D.   On: 09/03/2022 18:51    Microbiology: Results for orders placed or performed during the hospital encounter of 09/03/22  Resp panel by RT-PCR (RSV, Flu A&B, Covid) Anterior Nasal Swab     Status: None   Collection Time: 09/03/22  9:08 PM   Specimen: Anterior Nasal Swab  Result Value Ref Range Status   SARS Coronavirus 2 by RT PCR NEGATIVE NEGATIVE Final    Comment: (NOTE) SARS-CoV-2 target nucleic acids are NOT DETECTED.  The SARS-CoV-2 RNA is generally detectable in upper respiratory specimens during the acute phase of infection. The lowest concentration of SARS-CoV-2 viral copies this assay can detect is 138 copies/mL. A negative result does not preclude SARS-Cov-2 infection and should not be used as the sole basis for treatment or other patient management decisions. A negative result may occur with  improper specimen collection/handling, submission of specimen other than nasopharyngeal swab, presence of viral mutation(s) within the areas targeted by this assay, and inadequate number of viral copies(<138 copies/mL). A negative result must be combined with clinical observations, patient  history, and epidemiological information. The expected result is Negative.  Fact Sheet for Patients:  EntrepreneurPulse.com.au  Fact Sheet for Healthcare Providers:  IncredibleEmployment.be  This test is no t yet approved or cleared by the [place] FDA and  has been authorized for detection and/or diagnosis of SARS-CoV-2 by FDA under an Emergency Use Authorization (EUA). This EUA will remain  in effect (meaning this test can be used) for the duration of the COVID-19 declaration under Section 564(b)(1) of the Act, 21 U.S.C.section 360bbb-3(b)(1), unless the authorization is terminated  or revoked sooner.       Influenza A by PCR NEGATIVE NEGATIVE Final   Influenza B by PCR NEGATIVE NEGATIVE Final    Comment: (NOTE) The Xpert Xpress SARS-CoV-2/FLU/RSV plus assay is intended as an aid in the diagnosis of influenza from Nasopharyngeal swab specimens and should not be used as a sole basis for treatment. Nasal washings and aspirates are unacceptable for Xpert Xpress SARS-CoV-2/FLU/RSV testing.  Fact Sheet  for Patients: EntrepreneurPulse.com.au  Fact Sheet for Healthcare Providers: IncredibleEmployment.be  This test is not yet approved or cleared by the [place] FDA and has been authorized for detection and/or diagnosis of SARS-CoV-2 by FDA under an Emergency Use Authorization (EUA). This EUA will remain in effect (meaning this test can be used) for the duration of the COVID-19 declaration under Section 564(b)(1) of the Act, 21 U.S.C. section 360bbb-3(b)(1), unless the authorization is terminated or revoked.     Resp Syncytial Virus by PCR NEGATIVE NEGATIVE Final    Comment: (NOTE) Fact Sheet for Patients: EntrepreneurPulse.com.au  Fact Sheet for Healthcare Providers: IncredibleEmployment.be  This test is not yet approved or cleared by the [place] FDA  and has been authorized for detection and/or diagnosis of SARS-CoV-2 by FDA under an Emergency Use Authorization (EUA). This EUA will remain in effect (meaning this test can be used) for the duration of the COVID-19 declaration under Section 564(b)(1) of the Act, 21 U.S.C. section 360bbb-3(b)(1), unless the authorization is terminated or revoked.  Performed at [facility], [address] [place], [place] [address]     Labs: CBC: Recent Labs  Lab 09/02/22 1545 09/03/22 1812 09/04/22 0048 09/04/22 0730  WBC 8.7 10.1 9.4 7.3  NEUTROABS 5.4  --   --   --   HGB 15.0 14.5 13.0 12.7  HCT 44.3 44.0 39.0 38.7  MCV 99.5 98.2 97.7 98.5  PLT 286.0 249 215 [id number]   Basic Metabolic Panel: Recent Labs  Lab 09/02/22 1545 09/03/22 1812 09/04/22 0048 09/04/22 0730  NA 136 138  --  134*  K 4.7 3.9  --  4.7  CL 99 100  --  105  CO2 28 27  --  23  GLUCOSE 158* 65*  --  161*  BUN 35* 40*  --  34*  CREATININE 2.39* 2.32* 1.97* 1.89*  CALCIUM 9.2 9.1  --  8.1*   Liver Function Tests: Recent Labs  Lab 09/03/22 2108 09/04/22 0730  AST 129* 125*  ALT 125* 125*  ALKPHOS 83 71  BILITOT 0.6 0.5  PROT 6.5 5.8*  ALBUMIN 3.7 3.3*   CBG: Recent Labs  Lab 09/03/22 1816 09/04/22 0049 09/04/22 0731  GLUCAP 81 104* 151*    Discharge time spent: greater than 30 minutes.  Signed: Loletha Grayer, MD [facility] 09/04/2022

## 2022-09-04 NOTE — Assessment & Plan Note (Addendum)
No complaints of chest pain and EKG nonacute Continue Coreg, aspirin. Hold Crestor with elevated liver function test.

## 2022-09-04 NOTE — Assessment & Plan Note (Addendum)
Hemoglobin A1c 8.4 on 07/31/2022.  Discontinue glipizide.  Patient only on short acting insulin.  Advised her to follow-up with her physician and may restart lower dose long-acting insulin and cut back on the short acting insulin

## 2022-09-04 NOTE — Assessment & Plan Note (Addendum)
Inadequate oral intake Acute kidney injury on CKD stage IIIb.  Creatinine 2.32 on presentation.  With IV fluid hydration creatinine down to 1.89.  Held enalapril and Lasix.

## 2022-09-04 NOTE — Hospital Course (Signed)
77 y.o. female with medical history significant for CAD s/p PCI, HFpEF, PAD status post orbital atherectomy and  balloon angioplasty to the left SFA in 08/2020, DM2, CKD stage IIIb, HTN, HLD, COPD, hypothyroidism, orthostatic dizziness versus vertigo, and sleep apnea who presents to the ED with worsening renal function and worsening of her chronic dizziness.  Patient has been followed by cardiology in the past for dizzy episodes which are felt possibly related to orthostatic hypotension versus vertigo.  She underwent extensive cardiac workup in 2022 including echo, cardiac monitoring with Zio patch, carotid Doppler with no significant abnormal finding.  Over the past week symptoms have been worse and patient has had decreased oral intake and nausea with occasional vomiting.  She denies abdominal pain, diarrhea, fever or chills.  She does endorse mild shortness of breath ED course and data review: BP 140/76 with otherwise normal vitals.  Labs notable for normal CBC2,, BMP with BUN/creatinine 40/2.32.  Above most recent creatinine of 1.62 about 11 months prior.  Blood sugar 65.  Lipase 52, with transaminitis of AST 129 and ALT 125.  Troponin and BNP normal, alcohol level less than 0.10.  Urinalysis not consistent with UTI. EKG G personally viewed and interpreted showing NSR at 66 with nonspecific ST-T wave changes. CT head nonacute CT chest abdomen pelvis without contrast nonacute Patient given an NS bolus and hospitalist consulted for admission.   Patient seen on 09/04/2022.  She was dressed and she states she is ready to go home.  Patient states that she would be better at home.  Her creatinine did improve from 2.32 down to 1.89.  Advised to hold the enalapril.  Advised to hold the glipizide.  Advised to hold the Crestor.  Patient does not take any long-acting insulin.  She uses short acting insulin prior to meals.

## 2022-09-04 NOTE — Assessment & Plan Note (Addendum)
Patient was not orthostatic this was ruled out.

## 2022-09-04 NOTE — Assessment & Plan Note (Signed)
Continue gabapentin.

## 2022-09-04 NOTE — Assessment & Plan Note (Addendum)
With some wheeze will add Anoro inhaler. Ventolin as needed

## 2022-09-04 NOTE — Progress Notes (Signed)
Anticoagulation monitoring(Lovenox):  77 yo female ordered Lovenox 40 mg Q24h    Filed Weights   09/03/22 1806  Weight: 60.3 kg (133 lb)   BMI 21.5   Lab Results  Component Value Date   CREATININE 2.32 (H) 09/03/2022   CREATININE 2.39 (H) 09/02/2022   CREATININE 1.95 (H) 08/28/2022   Estimated Creatinine Clearance: 19.9 mL/min (A) (by C-G formula based on SCr of 2.32 mg/dL (H)). Hemoglobin & Hematocrit     Component Value Date/Time   HGB 14.5 09/03/2022 1812   HGB 13.6 04/19/2021 0959   HCT 44.0 09/03/2022 1812   HCT 39.6 04/19/2021 0959     Per Protocol for Patient with estCrcl < 30 ml/min and BMI < 30, will transition to Lovenox 30 mg Q24h.

## 2022-09-08 ENCOUNTER — Inpatient Hospital Stay: Payer: Medicare PPO | Admitting: Primary Care

## 2022-09-11 ENCOUNTER — Other Ambulatory Visit: Payer: Self-pay

## 2022-09-11 ENCOUNTER — Emergency Department: Payer: Medicare PPO

## 2022-09-11 ENCOUNTER — Emergency Department
Admission: EM | Admit: 2022-09-11 | Discharge: 2022-09-11 | Disposition: A | Discharge status: Home / Self Care | Payer: Medicare PPO | Attending: Emergency Medicine | Admitting: Emergency Medicine

## 2022-09-11 DIAGNOSIS — E119 Type 2 diabetes mellitus without complications: Secondary | ICD-10-CM | POA: Diagnosis not present

## 2022-09-11 DIAGNOSIS — R1084 Generalized abdominal pain: Secondary | ICD-10-CM | POA: Diagnosis not present

## 2022-09-11 DIAGNOSIS — R112 Nausea with vomiting, unspecified: Secondary | ICD-10-CM

## 2022-09-11 DIAGNOSIS — I10 Essential (primary) hypertension: Secondary | ICD-10-CM | POA: Insufficient documentation

## 2022-09-11 DIAGNOSIS — K859 Acute pancreatitis without necrosis or infection, unspecified: Secondary | ICD-10-CM

## 2022-09-11 DIAGNOSIS — J449 Chronic obstructive pulmonary disease, unspecified: Secondary | ICD-10-CM | POA: Diagnosis not present

## 2022-09-11 DIAGNOSIS — R739 Hyperglycemia, unspecified: Secondary | ICD-10-CM | POA: Diagnosis not present

## 2022-09-11 DIAGNOSIS — M549 Dorsalgia, unspecified: Secondary | ICD-10-CM | POA: Diagnosis not present

## 2022-09-11 DIAGNOSIS — R109 Unspecified abdominal pain: Secondary | ICD-10-CM | POA: Diagnosis not present

## 2022-09-11 DIAGNOSIS — N200 Calculus of kidney: Secondary | ICD-10-CM | POA: Diagnosis not present

## 2022-09-11 DIAGNOSIS — N281 Cyst of kidney, acquired: Secondary | ICD-10-CM | POA: Diagnosis not present

## 2022-09-11 LAB — COMPREHENSIVE METABOLIC PANEL
ALT: 61 U/L — ABNORMAL HIGH (ref 0–44)
AST: 35 U/L (ref 15–41)
Albumin: 3.8 g/dL (ref 3.5–5.0)
Alkaline Phosphatase: 70 U/L (ref 38–126)
Anion gap: 13 (ref 5–15)
BUN: 32 mg/dL — ABNORMAL HIGH (ref 8–23)
CO2: 21 mmol/L — ABNORMAL LOW (ref 22–32)
Calcium: 8.8 mg/dL — ABNORMAL LOW (ref 8.9–10.3)
Chloride: 100 mmol/L (ref 98–111)
Creatinine, Ser: 1.45 mg/dL — ABNORMAL HIGH (ref 0.44–1.00)
GFR, Estimated: 38 mL/min — ABNORMAL LOW (ref >60)
Glucose, Bld: 259 mg/dL — ABNORMAL HIGH (ref 70–99)
Potassium: 4.6 mmol/L (ref 3.5–5.1)
Sodium: 134 mmol/L — ABNORMAL LOW (ref 135–145)
Total Bilirubin: 0.7 mg/dL (ref 0.3–1.2)
Total Protein: 6.8 g/dL (ref 6.5–8.1)

## 2022-09-11 LAB — URINALYSIS, COMPLETE (UACMP) WITH MICROSCOPIC
Bacteria, UA: NONE SEEN
Bilirubin Urine: NEGATIVE
Glucose, UA: 150 mg/dL — AB
Hgb urine dipstick: NEGATIVE
Ketones, ur: NEGATIVE mg/dL
Leukocytes,Ua: NEGATIVE
Nitrite: NEGATIVE
Protein, ur: 30 mg/dL — AB
Specific Gravity, Urine: 1.017 (ref 1.005–1.030)
pH: 5 (ref 5.0–8.0)

## 2022-09-11 LAB — CBC
HCT: 44.6 % (ref 36.0–46.0)
Hemoglobin: 14.8 g/dL (ref 12.0–15.0)
MCH: 32 pg (ref 26.0–34.0)
MCHC: 33.2 g/dL (ref 30.0–36.0)
MCV: 96.5 fL (ref 80.0–100.0)
Platelets: 271 10*3/uL (ref 150–400)
RBC: 4.62 MIL/uL (ref 3.87–5.11)
RDW: 13 % (ref 11.5–15.5)
WBC: 9.1 10*3/uL (ref 4.0–10.5)
nRBC: 0 % (ref 0.0–0.2)

## 2022-09-11 LAB — LIPASE, BLOOD: Lipase: 92 U/L — ABNORMAL HIGH (ref 11–51)

## 2022-09-11 MED ORDER — SODIUM CHLORIDE 0.9 % IV BOLUS
1000.0000 mL | Freq: Once | INTRAVENOUS | Status: AC
  Administered 2022-09-11: 1000 mL via INTRAVENOUS

## 2022-09-11 MED ORDER — ONDANSETRON HCL 4 MG/2ML IJ SOLN
4.0000 mg | Freq: Once | INTRAMUSCULAR | Status: AC
  Administered 2022-09-11: 4 mg via INTRAVENOUS
  Filled 2022-09-11: qty 2

## 2022-09-11 MED ORDER — MORPHINE SULFATE (PF) 4 MG/ML IV SOLN
4.0000 mg | Freq: Once | INTRAVENOUS | Status: AC
  Administered 2022-09-11: 4 mg via INTRAVENOUS
  Filled 2022-09-11: qty 1

## 2022-09-11 MED ORDER — ONDANSETRON 4 MG PO TBDP: 4.0000 mg | ORAL_TABLET | Freq: Three times a day (TID) | ORAL | 0 refills | Status: DC | PRN

## 2022-09-11 MED ORDER — HYDROCODONE-ACETAMINOPHEN 5-325 MG PO TABS: 1.0000 | ORAL_TABLET | ORAL | 0 refills | Status: DC | PRN

## 2022-09-11 NOTE — Discharge Instructions (Signed)
As we discussed please avoid fatty/oily/greasy foods, see attached pancreatitis eating plan.  Use your pain and nausea medication as needed but only as prescribed.  Drink plenty of fluids and follow-up with your doctor within the next 2 to 3 days for recheck/reevaluation.  Return to the emergency department for any worsening abdominal pain or any other symptom personally concerning to yourself.

## 2022-09-11 NOTE — ED Provider Notes (Signed)
[facility] Provider Note    Event Date/Time   First MD Initiated Contact with Patient 09/11/22 1114     (approximate)  History   Chief Complaint: Back Pain and Emesis  HPI  Sarah Payne is a 77 y.o. female the past medical history of COPD, depression, gastric reflux, hypertension, hyperlipidemia, diabetes, presents to the emergency department for left flank pain nausea and vomiting.  According to the patient and record review patient was recently admitted to the hospital for nausea vomiting acute kidney injury.  Patient was discharged home on 09/04/2022 with a downtrending creatinine.  Patient states she was doing better however over the past so she has become nauseated once again with vomiting.  Patient states during her hospitalization she had left flank pain and continues to have left flank pain mostly in her left back.  Patient denies any dysuria denies any hematuria.  No fever.  Physical Exam   Triage Vital Signs: ED Triage Vitals  Enc Vitals Group     BP 09/11/22 1110 128/88     Pulse Rate 09/11/22 1110 92     Resp 09/11/22 1110 17     Temp 09/11/22 1110 98.4 F (36.9 C)     Temp Source 09/11/22 1110 Oral     SpO2 09/11/22 1110 96 %     Weight --      Height --      Head Circumference --      Peak Flow --      Pain Score 09/11/22 1111 0     Pain Loc --      Pain Edu? --      Excl. in [place]? --     Most recent vital signs: Vitals:   09/11/22 1110  BP: 128/88  Pulse: 92  Resp: 17  Temp: 98.4 F (36.9 C)  SpO2: 96%    General: Awake, no distress.  CV:  Good peripheral perfusion.  Regular rate and rhythm  Resp:  Normal effort.  Equal breath sounds bilaterally.  Abd:  No distention.  Soft, mild left upper and lower quadrant tenderness to palpation.  No rebound or guarding.  ED Results / Procedures / Treatments   RADIOLOGY  I have reviewed and interpreted the CT images.  I do not see any obvious ureteral stones on my  evaluation. Radiology is read the CT scan as left kidney cyst left renal stone but no hydronephrosis.   MEDICATIONS ORDERED IN ED: Medications  sodium chloride 0.9 % bolus 1,000 mL (has no administration in time range)  ondansetron (ZOFRAN) injection 4 mg (has no administration in time range)  morphine (PF) 4 MG/ML injection 4 mg (has no administration in time range)     IMPRESSION / MDM / ASSESSMENT AND PLAN / ED COURSE  I reviewed the triage vital signs and the nursing notes.  Patient's presentation is most consistent with acute presentation with potential threat to life or bodily function.  Patient presents emergency department for left flank pain nausea and vomiting.  Overall the patient appears well does have mild left-sided abdominal tenderness to palpation on exam.  Patient was recently discharged from the hospital 1 week ago with a downtrending creatinine after admission for acute kidney injury secondary to nausea and vomiting.  I do not see any abdominal imaging performed during this admission.  Given the patient's left flank pain we will obtain a CT renal scan to rule out urinary obstruction or ureterolithiasis.  We will check urinalysis to  evaluate for possible infection or pyelonephritis, we will check labs including renal function and LFTs given her slight LFT elevation during her admission although the patient states she is status post cholecystectomy.  We will treat pain nausea and IV hydrate while awaiting results.  Patient's workup is overall reassuring chemistry shows improving renal function creatinine currently 1.45.  CBC is normal.  Patient's lipase is mildly elevated.  Patient denies any alcohol use.  Discussed with the patient low-fat diet for the next 2 weeks.  Will prescribe pain and nausea medication for the patient.  Discussed return precautions.  FINAL CLINICAL IMPRESSION(S) / ED DIAGNOSES   Left flank pain Pancreatitis  Note:  This document was prepared using  Dragon voice recognition software and may include unintentional dictation errors.   Harvest Dark, MD 09/11/22 [phone]

## 2022-09-19 ENCOUNTER — Other Ambulatory Visit: Payer: Self-pay | Admitting: Primary Care

## 2022-09-19 ENCOUNTER — Ambulatory Visit (INDEPENDENT_AMBULATORY_CARE_PROVIDER_SITE_OTHER): Payer: Medicare PPO | Admitting: Primary Care

## 2022-09-19 ENCOUNTER — Telehealth: Payer: Self-pay | Admitting: Student in an Organized Health Care Education/Training Program

## 2022-09-19 ENCOUNTER — Other Ambulatory Visit (INDEPENDENT_AMBULATORY_CARE_PROVIDER_SITE_OTHER): Payer: Medicare PPO

## 2022-09-19 ENCOUNTER — Telehealth: Payer: Self-pay | Admitting: Primary Care

## 2022-09-19 ENCOUNTER — Encounter: Payer: Self-pay | Admitting: Primary Care

## 2022-09-19 VITALS — BP 116/72 | HR 72 | Temp 97.3°F | Ht 66.0 in | Wt 133.0 lb

## 2022-09-19 DIAGNOSIS — E11649 Type 2 diabetes mellitus with hypoglycemia without coma: Secondary | ICD-10-CM | POA: Diagnosis not present

## 2022-09-19 DIAGNOSIS — R42 Dizziness and giddiness: Secondary | ICD-10-CM

## 2022-09-19 DIAGNOSIS — G8929 Other chronic pain: Secondary | ICD-10-CM

## 2022-09-19 DIAGNOSIS — R748 Abnormal levels of other serum enzymes: Secondary | ICD-10-CM | POA: Diagnosis not present

## 2022-09-19 DIAGNOSIS — R110 Nausea: Secondary | ICD-10-CM

## 2022-09-19 DIAGNOSIS — N1832 Chronic kidney disease, stage 3b: Secondary | ICD-10-CM

## 2022-09-19 DIAGNOSIS — N179 Acute kidney failure, unspecified: Secondary | ICD-10-CM

## 2022-09-19 DIAGNOSIS — K859 Acute pancreatitis without necrosis or infection, unspecified: Secondary | ICD-10-CM | POA: Diagnosis not present

## 2022-09-19 DIAGNOSIS — E1151 Type 2 diabetes mellitus with diabetic peripheral angiopathy without gangrene: Secondary | ICD-10-CM | POA: Diagnosis not present

## 2022-09-19 DIAGNOSIS — R7401 Elevation of levels of liver transaminase levels: Secondary | ICD-10-CM

## 2022-09-19 DIAGNOSIS — M549 Dorsalgia, unspecified: Secondary | ICD-10-CM | POA: Diagnosis not present

## 2022-09-19 DIAGNOSIS — Z794 Long term (current) use of insulin: Secondary | ICD-10-CM

## 2022-09-19 HISTORY — DX: Acute pancreatitis without necrosis or infection, unspecified: K85.90

## 2022-09-19 LAB — COMPREHENSIVE METABOLIC PANEL
ALT: 371 U/L — ABNORMAL HIGH (ref 0–35)
AST: 538 U/L — ABNORMAL HIGH (ref 0–37)
Albumin: 4 g/dL (ref 3.5–5.2)
Alkaline Phosphatase: 106 U/L (ref 39–117)
BUN: 30 mg/dL — ABNORMAL HIGH (ref 6–23)
CO2: 30 mEq/L (ref 19–32)
Calcium: 9.7 mg/dL (ref 8.4–10.5)
Chloride: 100 mEq/L (ref 96–112)
Creatinine, Ser: 1.84 mg/dL — ABNORMAL HIGH (ref 0.40–1.20)
GFR: 26.69 mL/min — ABNORMAL LOW (ref >60.00)
Glucose, Bld: 170 mg/dL — ABNORMAL HIGH (ref 70–99)
Potassium: 4.5 mEq/L (ref 3.5–5.1)
Sodium: 137 mEq/L (ref 135–145)
Total Bilirubin: 0.4 mg/dL (ref 0.2–1.2)
Total Protein: 6.8 g/dL (ref 6.0–8.3)

## 2022-09-19 LAB — LIPASE: Lipase: 75 U/L — ABNORMAL HIGH (ref 11.0–59.0)

## 2022-09-19 LAB — GAMMA GT: GGT: 56 U/L — ABNORMAL HIGH (ref 7–51)

## 2022-09-19 MED ORDER — LANTUS SOLOSTAR 100 UNIT/ML ~~LOC~~ SOPN: 10.0000 [IU] | PEN_INJECTOR | Freq: Every day | SUBCUTANEOUS | 2 refills | Status: DC

## 2022-09-19 MED ORDER — FAMOTIDINE 20 MG PO TABS: 20.0000 mg | ORAL_TABLET | Freq: Every day | ORAL | 0 refills | Status: DC

## 2022-09-19 MED ORDER — GABAPENTIN 300 MG PO CAPS: 300.0000 mg | ORAL_CAPSULE | Freq: Two times a day (BID) | ORAL | 0 refills | Status: DC

## 2022-09-19 NOTE — Assessment & Plan Note (Signed)
Recent ED visit.  ED notes, labs, imaging reviewed.  Repeat lipase pending. She appears stable for outpatient treatment.

## 2022-09-19 NOTE — Patient Instructions (Addendum)
We reduced the dose of your gabapentin to 300 mg twice daily for pain.  Start Lantus insulin, inject 10 units into the skin every morning for diabetes.  Titrate your mealtime insulin as needed as discussed.  Remain off of glipizide.  Start famotidine (Pepcid) 20 mg at bedtime for nausea and acid.  I will see you in April as scheduled.

## 2022-09-19 NOTE — Assessment & Plan Note (Signed)
Remain off glipizide.  Start Lantus 10 units every morning Continue NovoLog 3 times daily with meals.  She understands titration.  Follow-up in April as scheduled.

## 2022-09-19 NOTE — Assessment & Plan Note (Signed)
Will reduce gabapentin to 300 mg twice daily to see if this was contributing. She will update.

## 2022-09-19 NOTE — Telephone Encounter (Signed)
Please call patient's pharmacy:  We are trying a dose reduction of her gabapentin to 300 mg twice daily as she is constantly dizzy/drowsy throughout the day.

## 2022-09-19 NOTE — Progress Notes (Signed)
Subjective:    Patient ID: Sarah Payne, female    DOB: 02/02/1946, 77 y.o.   MRN: [id number]  HPI  Sarah Payne is a very pleasant 77 y.o. female with a significant medical history including CAD, CHF, orthostatic hypotension, hypertension, cardiomyopathy, COPD, sleep apnea, hypothyroidism, uncontrolled type 2 diabetes, anxiety depression, chronic pain syndrome who presents today for hospital follow-up.  Her son joins [place] today.  She originally presented to our office on 09/02/2022 with recurrent nausea and vomiting.  Labs revealed acute dehydration with acute kidney injury so she was referred to the emergency department.  She presented to [facility] ED on 09/03/2022 for lightheadedness, nausea, and AKI secondary to dehydration.  Lab work in the ED confirmed acute dehydration with AKI and lipase of 52 with transaminitis (AST 129 and ALT 125).  CT abdomen pelvis was negative for acute process.  She was admitted for further evaluation.  During her hospital stay she was treated with IV fluids.  Her glipizide was discontinued.  Her enalapril and Lasix were held.  Renal function improved with last creatinine of 1.89 which was down from 2.32.  Hepatitis profile is negative.  It was recommended she resume long-acting insulin.  She was discharged home on 09/04/2022 with recommendations for PCP follow-up.  She then presented to [facility] ED on 09/11/2022 for back pain and vomiting.  Lipase was elevated at 92 which was higher than her recent hospitalization.  She underwent CT renal scan which was negative for pyelonephritis and renal stone.  She was treated with IV fluids and IV antinausea medication.  Labs revealed improved renal function from hospitalization.  She was discharged home later that day.  Since her hospitalization and ED visits she continues to feel "sick" with dizziness and nausea. She is vomiting once daily in the morning, throws up "acid". She continues with chronic nausea throughout the day. She "feels  drunk" almost everyday with dizziness. She is managed on gabapentin 600 mg BID, does not take Trazodone, mirtazapine, or amitriptyline.  She denies diarrhea, fevers. She has not taken her Glipizide.    Review of Systems  Constitutional:  Negative for fever.  Gastrointestinal:  Positive for nausea and vomiting. Negative for diarrhea.  Neurological:  Positive for dizziness and light-headedness.         Past Medical History:  Diagnosis Date   Aortic atherosclerosis ([place])    Bilateral cataracts 01/2015   a.) s/p extraction   CHF (congestive heart failure) ([place]) 09/16/2013   a.) TTE 09/16/2013: EF 55-60%; mod inferior HK, triv TR; G1DD. b.) TTE 01/02/2016: EF 25-30%; mid-apicalateroseptal, lat, inf, inferoseptal, apical akinesis; triv TR; G1DD. c. TTE 04/16/2016: EF 65%; G1DD. d.) TTE 05/02/2020: EF 50-55%; G2DD. e.) TTE 04/11/2021: EF 55%; G1DD.   Chronic low back pain    Clostridioides difficile infection 2015   COPD (chronic obstructive pulmonary disease) (HCC)    Coronary artery disease    a.) PCI 02/06/2011: 100% mLCx - DES x 3. b.) PCI 09/17/2011: 95% dRCA - DES x 1. c.) PCI 10/02/2011: 75% pLCX - DES x 1. d.) PCI 08/19/2012: DES x 2 p-mRCA.  e.) [place] 01/01/16: Takotsubo event 01/01/2016 with patent stents   Depression    Diverticulosis    Frequent PVCs    a. Noted in hospital 12/2015.   GERD (gastroesophageal reflux disease)    History of heart artery stent    a.) TOTAL of 7 stents: a.) 02/06/2011 - overlapping 2.5x12 mm Xience V, 2.5x8 mm Xience V, 2.25x8 mm  Xience Nano to [employer]. b.) 09/17/2011 - 2.5x23 Xience V dRCA. c.) 10/02/2011 - 2.5x15 mm Xience V pLCx. d.) 08/09/2012 - overlapping 3.0x9m mRCA and 3.5x274mpRCA   Hyperlipidemia    Hypertension    Hypothyroidism    Impingement syndrome of left shoulder    LBBB (left bundle branch block)    Long term current use of antithrombotics/antiplatelets    a.) clopidogrel   Marijuana abuse    [facility]   a.)  multiple MIs;  5 per her report   OSA (obstructive sleep apnea)    a.) mild; does not require nocturnal PAP therapy   Paroxysmal SVT (supraventricular tachycardia) 05/08/2021   a.)  Zio patch 05/08/2021: 3 distinct SVT runs; fastest 5 beats at a rate of 146 bpm; longest 7 beats at rate of 134 bpm   [place] mellitus[place]   Takotsubo cardiomyopathy    a. 12/2015 - nephew committed suicide 1 week prior, sister died the morning of presentation - initially called a STEMI; cath with patent stents. LVEF 25-30%.   Tendonitis of left rotator cuff    Tobacco abuse    Vascular dementia     Social History   Socioeconomic History   Marital status: Married    Spouse name: Not on file   Number of children: Not on file   Years of education: Not on file   Highest education level: Not on file  Occupational History   Not on file  Tobacco Use   Smoking status: Every Day    Packs/day: 1.00    Years: 45.00    Total pack years: 45.00    Types: Cigarettes   Smokeless tobacco: Never   Tobacco comments:    Has cut back, trying to quit.   Vaping Use   Vaping Use: Never used  Substance and Sexual Activity   Alcohol use: No    Alcohol/week: 0.0 standard drinks of alcohol   Drug use: Yes    Types: Marijuana    Comment: last noc   Sexual activity: Not on file  Other Topics Concern   Not on file  Social History Narrative   [place] Previously used marijuana - quit.      Regular exercise: no/ pain from a frozen rotator cuff   Caffeine use: coffee daily and pepsi      Does not have a living will.   Daughters and husband know her wishes- would desire CPR but not prolonged life support if futile   Social Determinants of Health   Financial Resource Strain: Low Risk  (08/06/2022)   Overall Financial Resource Strain (CARDIA)    Difficulty of Paying Living Expenses: Not hard at all  Food Insecurity: No Food Insecurity (09/04/2022)   Hunger Vital  Sign    Worried About Running Out of Food in the Last Year: Never true    Ran Out of Food in the Last Year: Never true  Transportation Needs: No Transportation Needs (09/04/2022)   PRAPARE - TrHydrologistMedical): No    Lack of Transportation (Non-Medical): No  Physical Activity: Inactive (08/06/2022)   Exercise Vital Sign    Days of Exercise per Week: 0 days    Minutes of Exercise per Session: 0 min  Stress: No Stress Concern Present (08/06/2022)   [place]  Feeling of Stress : Only a little  Social Connections: Socially Isolated (08/06/2022)   Social Connection and Isolation Panel [NHANES]    Frequency of Communication with Friends and Family: Never    Frequency of Social Gatherings with Friends and Family: Never    Attends Religious Services: Never    Marine scientist or Organizations: No    Attends Archivist Meetings: Never    Marital Status: Married  Human resources officer Violence: Not At Risk (09/04/2022)   Humiliation, Afraid, Rape, and Kick questionnaire    Fear of Current or Ex-Partner: No    Emotionally Abused: No    Physically Abused: No    Sexually Abused: No    Past Surgical History:  Procedure Laterality Date   ABDOMINAL AORTOGRAM W/LOWER EXTREMITY N/A 09/12/2020   Procedure: ABDOMINAL AORTOGRAM W/LOWER EXTREMITY;  Surgeon: Wellington Hampshire, MD;  Location: [place] [facility];  Service: Cardiovascular;  Laterality: N/A;   ABDOMINAL HYSTERECTOMY     APPENDECTOMY     BACK SURGERY     CARDIAC CATHETERIZATION N/A 01/01/2016   Procedure: Left Heart Cath and Coronary Angiography;  Surgeon: Jettie Booze, MD;  Location: [place] [facility];  Service: Cardiovascular;  Laterality: N/A;   CATARACT EXTRACTION W/PHACO Left 01/30/2015   Procedure: CATARACT EXTRACTION PHACO AND INTRAOCULAR LENS PLACEMENT (IOC);  Surgeon: Birder Robson, MD;  Location: [facility];   Service: Ophthalmology;  Laterality: Left;  [place] 00:47    CATARACT EXTRACTION W/PHACO Right 02/13/2015   Procedure: CATARACT EXTRACTION PHACO AND INTRAOCULAR LENS PLACEMENT (IOC);  Surgeon: Birder Robson, MD;  Location: [facility];  Service: Ophthalmology;  Laterality: Right;  cassette lot # [id number] H [place]  00:29.9 AP  20.7 CDE  6.20   COLONOSCOPY N/A 11/02/2014   Procedure: COLONOSCOPY;  Surgeon: Inda Castle, MD;  Location: [place];  Service: Endoscopy;  Laterality: N/A;   CORONARY ANGIOPLASTY WITH STENT PLACEMENT Left 02/06/2011   Procedure: CORONARY ANGIOPLASTY WITH STENT PLACEMENT; Location: [place]; Surgeon: Katrine Coho, MD   CORONARY ANGIOPLASTY WITH STENT PLACEMENT Left 09/17/2011   Procedure: CORONARY ANGIOPLASTY WITH STENT PLACEMENT; Location: [place]; Surgeon: Katrine Coho, MD   CORONARY ANGIOPLASTY WITH STENT PLACEMENT Left 10/02/2011   Procedure: CORONARY ANGIOPLASTY WITH STENT PLACEMENT; Location: [place]; Surgeon: Katrine Coho, MD   CORONARY ANGIOPLASTY WITH STENT PLACEMENT Left 08/19/2012   Procedure: CORONARY ANGIOPLASTY WITH STENT PLACEMENT; Location: [place]; Surgeon: Kathlyn Sacramento, MD   ESOPHAGOGASTRODUODENOSCOPY (EGD) WITH PROPOFOL N/A 09/21/2018   Procedure: ESOPHAGOGASTRODUODENOSCOPY (EGD) WITH PROPOFOL;  Surgeon: [name], MD;  Location: [facility] END[facility];  Service: Gastroenterology;  Laterality: N/A;   LEFT HEART CATH AND CORONARY ANGIOGRAPHY Left 08/02/2014   Procedure: LEFT HEART CATH AND CORONARY ANGIOGRAPHY; Location: [place]; Surgeon: Glenetta Hew, MD   PERIPHERAL VASCULAR ATHERECTOMY Left 09/12/2020   Procedure: PERIPHERAL VASCULAR ATHERECTOMY;  Surgeon: Wellington Hampshire, MD;  Location: [place] [facility];  Service: Cardiovascular;  Laterality: Left;   PERIPHERAL VASCULAR BALLOON ANGIOPLASTY  09/12/2020   Procedure: PERIPHERAL VASCULAR BALLOON ANGIOPLASTY;  Surgeon: Wellington Hampshire, MD;  Location: [place] [facility];  Service: Cardiovascular;;   REVERSE  SHOULDER ARTHROPLASTY Left 10/01/2021   Procedure: REVERSE SHOULDER ARTHROPLASTY WITH BICEPS TENODESIS.;  Surgeon: Corky Mull, MD;  Location: [facility];  Service: Orthopedics;  Laterality: Left;   SHOULDER SURGERY Left 2017    Family History  Problem Relation Age of Onset   Heart attack Mother        First MI @ 56 - Died @ 64   Heart disease Mother  Heart disease Father        Died @ 87   Throat cancer Brother    Liver cancer Brother    Colon cancer Sister     No Known Allergies  Current Outpatient Medications on File Prior to Visit  Medication Sig Dispense Refill   albuterol (VENTOLIN HFA) 108 (90 Base) MCG/ACT inhaler Inhale 2 puffs into the lungs every 6 (six) hours as needed for wheezing or shortness of breath. 8 g 0   carvedilol (COREG) 3.125 MG tablet Take 1 tablet (3.125 mg total) by mouth 2 (two) times daily. 180 tablet 3   clobetasol cream (TEMOVATE) [id number] % Apply 1 Application topically 2 (two) times daily as needed. For vaginal itching. 30 g 0   clopidogrel (PLAVIX) 75 MG tablet Take 1 tablet (75 mg total) by mouth daily. 90 tablet 3   Continuous Blood Gluc Receiver (FREESTYLE LIBRE 2 READER) DEVI Use with sensor to check blood sugars 6 times daily. 1 each 0   ezetimibe (ZETIA) 10 MG tablet Take 1 tablet (10 mg total) by mouth daily. For cholesterol. 90 tablet 3   furosemide (LASIX) 20 MG tablet Take 1 tablet (20 mg total) by mouth as needed (As needed for weight gain greater than 3 pounds overnight or increased shortness of breath.). Take one tab only on Monday, Wednesday, & Fridays 90 tablet 3   glucose blood (FREESTYLE LITE) test strip Test up to four times a day as needed. DX 100 each 0   HYDROcodone-acetaminophen (NORCO/VICODIN) 5-325 MG tablet Take 1 tablet by mouth every 4 (four) hours as needed. 15 tablet 0   insulin aspart (NOVOLOG FLEXPEN) 100 UNIT/ML FlexPen Inject 18 units into the skin with breakfast, 14 units with lunch, and 16 units with dinner for diabetes.  Sliding scal 45 mL 0   Insulin Pen Needle (INSUPEN PEN NEEDLES) 32G X 4 MM MISC Use to inject insulin 4 times daily. 250 each 1   levothyroxine (SYNTHROID) 100 MCG tablet Take 100 mcg by mouth daily before breakfast.     mirtazapine (REMERON) 7.5 MG tablet Take 1 tablet (7.5 mg total) by mouth at bedtime. For appetite and sleep. 90 tablet 0   ondansetron (ZOFRAN-ODT) 4 MG disintegrating tablet Take 1 tablet (4 mg total) by mouth every 8 (eight) hours as needed for nausea or vomiting. 20 tablet 0   potassium chloride (KLOR-CON M) 10 MEQ tablet Only take on days you take your lasix. Take 1 tablet (10 mEq total) by mouth for 1 dose on those days you take your lasix '20mg'$  pill. 90 tablet 3   umeclidinium-vilanterol (ANORO ELLIPTA) 62.5-25 MCG/ACT AEPB Inhale 1 puff into the lungs daily. 60 each 0   ondansetron (ZOFRAN) 4 MG tablet Take 1 tablet (4 mg total) by mouth every 6 (six) hours as needed for nausea. (Patient not taking: Reported on 09/19/2022) 30 tablet 0   No current facility-administered medications on file prior to visit.    BP 116/72   Pulse 72   Temp (!) 97.3 F (36.3 C) (Temporal)   Ht '5\' 6"'$  (1.676 m)   Wt 133 lb (60.3 kg)   SpO2 98%   BMI 21.47 kg/m  Objective:   Physical Exam Cardiovascular:     Rate and Rhythm: Normal rate and regular rhythm.  Pulmonary:     Effort: Pulmonary effort is normal.     Breath sounds: Normal breath sounds.  Musculoskeletal:     Cervical back: Neck supple.  Skin:  General: Skin is warm and dry.           Assessment & Plan:  Diabetes mellitus type 2 with peripheral artery disease (HCC) -     Lipase -     Lantus SoloStar; Inject 10 Units into the skin daily. for diabetes.  Dispense: 15 mL; Refill: 2 -     Comprehensive metabolic panel  Chronic bilateral back pain, unspecified back location Assessment & Plan: It is possible that gabapentin could be contributing to her dizziness, discussed this with patient and her son today.  Reduce  gabapentin to 300 mg twice daily. She will update.  Orders: -     Gabapentin; Take 1 capsule (300 mg total) by mouth 2 (two) times daily. For pain  Dispense: 180 capsule; Refill: 0  Chronic nausea -     Famotidine; Take 1 tablet (20 mg total) by mouth at bedtime. For nausea and acid  Dispense: 90 tablet; Refill: 0 -     Comprehensive metabolic panel  Uncontrolled type 2 diabetes mellitus with hypoglycemia, with long-term current use of insulin (HCC) Assessment & Plan: Remain off glipizide.  Start Lantus 10 units every morning Continue NovoLog 3 times daily with meals.  She understands titration.  Follow-up in April as scheduled.   Transaminitis Assessment & Plan: Repeat liver enzymes pending.   Dizziness Assessment & Plan: Will reduce gabapentin to 300 mg twice daily to see if this was contributing. She will update.   Acute renal failure superimposed on stage 3b chronic kidney disease, unspecified acute renal failure type [facility]) Assessment & Plan: Recent hospital admission, hospital notes/labs/imaging reviewed.  Repeat lipase, liver enzymes, renal function pending.  Will work to address nausea/vomiting. Start famotidine 20 mg at bedtime. She will update.  Consider Prilosec 20 mg at bedtime if no improvement.         [name], NP

## 2022-09-19 NOTE — Telephone Encounter (Signed)
Please give Melissa a call back , she has a question about patient prescription. TY

## 2022-09-19 NOTE — Assessment & Plan Note (Signed)
It is possible that gabapentin could be contributing to her dizziness, discussed this with patient and her son today.  Reduce gabapentin to 300 mg twice daily. She will update.

## 2022-09-19 NOTE — Assessment & Plan Note (Signed)
Recent hospital admission, hospital notes/labs/imaging reviewed.  Repeat lipase, liver enzymes, renal function pending.  Will work to address nausea/vomiting. Start famotidine 20 mg at bedtime. She will update.  Consider Prilosec 20 mg at bedtime if no improvement.

## 2022-09-19 NOTE — Telephone Encounter (Signed)
Called and clarified with the pharmacist that the patient is to now take '300mg'$  tab twice daily as her dizziness may have been caused by the '600mg'$  dose.

## 2022-09-19 NOTE — Telephone Encounter (Signed)
[place], [place] - 3[address]   Called stated pt has another prescription for RX  gabapentin (NEURONTIN) 300 MG capsule   From another Dr office for 600 mg request a call back [phone]

## 2022-09-19 NOTE — Assessment & Plan Note (Signed)
Repeat liver enzymes pending. 

## 2022-09-22 ENCOUNTER — Telehealth: Payer: Self-pay

## 2022-09-22 ENCOUNTER — Telehealth: Payer: Self-pay | Admitting: Student in an Organized Health Care Education/Training Program

## 2022-09-22 DIAGNOSIS — R7989 Other specified abnormal findings of blood chemistry: Secondary | ICD-10-CM

## 2022-09-22 NOTE — Telephone Encounter (Signed)
Called patient to let her know that Dr. Vicente Males would like to see her this Wednesday 09/24/2022 at 2:15 PM. Patient agreed. I also let her know that she will also have an ultrasound scheduled since Dr. Vicente Males would like to know how is her liver and she also agreed on doing it. I told her that she would see it in her MyChart.

## 2022-09-22 NOTE — Telephone Encounter (Signed)
Melissa from [facility] have some questions about prescription that was send in. Please give Melissa a call. TY

## 2022-09-22 NOTE — Telephone Encounter (Signed)
-----   Message from [name], MD sent at 09/19/2022  4:51 PM EST ----- Regarding: RE: Help! Elevated LFTs Hi  Lfts are pretty high I would need to see her   Sharyn Lull / Herb Grays can I see her on Wednesday afternoon before I leave on vacation please . Go ahead and schedule ultrasound for abnormal Lfts and I will order full liver work up on Wednesday   Kiran ----- Message ----- From: [name], NP Sent: 09/19/2022   4:48 PM EST To: [name], MD Subject: Help! Elevated LFTs                            Hi Kiran,  It's me again!  Sorry to bother you, but I need some advice.  Marked elevated LFTs today, previous mild elevation in LFTs from last few weeks. Also with mild pancreatitis (improving) Abdominal pain, nausea, vomiting (persistent) - causing renal injury.  Recent hospitalization and subsequent ED visit.  Recent CT scans do not reveal obvious abdominal abnormality.  I cannot add an acute hepatitis panel to my existing blood work from today, but I can have her come back Monday. I was able to add a GGT and nucleotidase 5. Awaiting results.   I do question if her elevated liver enzymes are secondary to another cause? Should I consider a NM bone scan?  I appreciate your time! [place]

## 2022-09-22 NOTE — Telephone Encounter (Signed)
Called and spoke to pharmacy on 09/19/22. See telephone encounter.   Called and clarified with the pharmacist that the patient is to now take '300mg'$  tab twice daily as her dizziness may have been caused by the '600mg'$  dose.

## 2022-09-22 NOTE — Telephone Encounter (Signed)
[facility]  Pharmacist reported that patient also received a prescription for Gabapentin from here PCP, so they discontinued the script written by you.

## 2022-09-22 NOTE — Telephone Encounter (Signed)
Called patient back. She stated that she did not have any questions about her prescription. Instructed to call with any issues.

## 2022-09-24 ENCOUNTER — Other Ambulatory Visit: Payer: Self-pay

## 2022-09-24 ENCOUNTER — Ambulatory Visit (INDEPENDENT_AMBULATORY_CARE_PROVIDER_SITE_OTHER): Payer: Medicare PPO | Admitting: Gastroenterology

## 2022-09-24 ENCOUNTER — Encounter: Payer: Self-pay | Admitting: Gastroenterology

## 2022-09-24 VITALS — BP 156/95 | HR 89 | Temp 98.8°F | Ht 66.0 in | Wt 130.6 lb

## 2022-09-24 DIAGNOSIS — R634 Abnormal weight loss: Secondary | ICD-10-CM

## 2022-09-24 DIAGNOSIS — K219 Gastro-esophageal reflux disease without esophagitis: Secondary | ICD-10-CM | POA: Diagnosis not present

## 2022-09-24 DIAGNOSIS — R7989 Other specified abnormal findings of blood chemistry: Secondary | ICD-10-CM | POA: Diagnosis not present

## 2022-09-24 MED ORDER — OMEPRAZOLE 40 MG PO CPDR: 40.0000 mg | DELAYED_RELEASE_CAPSULE | Freq: Every day | ORAL | 3 refills | Status: DC

## 2022-09-24 NOTE — Progress Notes (Signed)
Sarah Bellows MD, MRCP(U.K) [address]  [place]  [place], [place] [address]  Main: [phone]  Fax: [phone]   Gastroenterology Consultation v   Referring Provider:     [name], NP Primary Care Physician:  [name], NP Primary Gastroenterologist:  Dr. Jonathon Payne  Reason for Consultation:    abnormal LFT's         HPI:   Sarah Payne is a 77 y.o. y/o female referred for consultation & management  by  [name], NP.    Summary of history : She was last seen at my office back in May 2020 for dysphagia.  I have dilated her empirically to 39 [place] and ruled out eosinophilic esophagitis.  At that point of time she was doing well she had poor dentition and was supposed to get false teeth.   Interval history  Today she has been referred to see me for elevated LFTs.  Right upper quadrant ultrasound has been ordered but not yet obtained  09/19/2022: LFTs AST 538 ALT 371 gradual rise over the past 3 weeks.  GGT is also elevated at 56.  Hepatitis B surface antigen and hepatitis C virus antibody negative. Echo in 2023 showed no elevated pulmonary artery systolic pressure.   She states she has lost 16 pounds of weight recently.  Throws up a lot of bile was recently started on 20 mg of Prilosec which she has taken just before bedtime.  Denies any alcohol use continues to smoke trying to cut down.  Denies any illegal drug use no tattoos.  No new medications except gabapentin.   Past Medical History:  Diagnosis Date   Aortic atherosclerosis ([facility])    Bilateral cataracts 01/2015   a.) s/p extraction   CHF (congestive heart failure) ([facility]) 09/16/2013   a.) TTE 09/16/2013: EF 55-60%; mod inferior HK, triv TR; G1DD. b.) TTE 01/02/2016: EF 25-30%; mid-apicalateroseptal, lat, inf, inferoseptal, apical akinesis; triv TR; G1DD. c. TTE 04/16/2016: EF 65%; G1DD. d.) TTE 05/02/2020: EF 50-55%; G2DD. e.) TTE 04/11/2021: EF 55%; G1DD.   Chronic low back pain     Clostridioides difficile infection 2015   COPD (chronic obstructive pulmonary disease) (HCC)    Coronary artery disease    a.) PCI 02/06/2011: 100% mLCx - DES x 3. b.) PCI 09/17/2011: 95% dRCA - DES x 1. c.) PCI 10/02/2011: 75% pLCX - DES x 1. d.) PCI 08/19/2012: DES x 2 p-mRCA.  e.) [place] 01/01/16: Takotsubo event 01/01/2016 with patent stents   Depression    Diverticulosis    Frequent PVCs    a. Noted in hospital 12/2015.   GERD (gastroesophageal reflux disease)    History of heart artery stent    a.) TOTAL of 7 stents: a.) 02/06/2011 - overlapping 2.5x12 mm Xience V, 2.5x8 mm Xience V, 2.25x8 mm Xience Nano to mLCx. b.) 09/17/2011 - 2.5x23 Xience V dRCA. c.) 10/02/2011 - 2.5x15 mm Xience V pLCx. 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 12/2015 - nephew committed suicide 1 week prior, sister died the morning of presentation - initially called a STEMI; cath with patent stents. LVEF 25-30%.   Tendonitis of left rotator cuff    Tobacco abuse    Vascular dementia     Past Surgical History:  Procedure Laterality Date   ABDOMINAL AORTOGRAM W/LOWER EXTREMITY N/A 09/12/2020   Procedure: ABDOMINAL AORTOGRAM W/LOWER EXTREMITY;  Surgeon: Wellington Hampshire, MD;  Location: [facility];  Service: Cardiovascular;  Laterality: N/A;   ABDOMINAL  HYSTERECTOMY     APPENDECTOMY     BACK SURGERY     CARDIAC CATHETERIZATION N/A 01/01/2016   Procedure: Left Heart Cath and Coronary Angiography;  Surgeon: Jettie Booze, MD;  Location: [facility];  Service: Cardiovascular;  Laterality: N/A;   CATARACT EXTRACTION W/PHACO Left 01/30/2015   Procedure: CATARACT EXTRACTION PHACO AND INTRAOCULAR LENS PLACEMENT (IOC);  Surgeon: Birder Robson, MD;  Location: [facility];  Service: Ophthalmology;  Laterality: Left;  [place] 00:47    CATARACT EXTRACTION W/PHACO Right 02/13/2015   Procedure: CATARACT EXTRACTION PHACO AND INTRAOCULAR LENS PLACEMENT (IOC);  Surgeon: Birder Robson, MD;  Location: [facility];  Service: Ophthalmology;  Laterality: Right;  cassette lot # [id number] H [place]  00:29.9 AP  20.7 CDE  6.20   COLONOSCOPY N/A 11/02/2014   Procedure: COLONOSCOPY;  Surgeon: Inda Castle, MD;  Location: [facility];  Service: Endoscopy;  Laterality: N/A;   CORONARY ANGIOPLASTY WITH STENT PLACEMENT Left 02/06/2011   Procedure: CORONARY ANGIOPLASTY WITH STENT PLACEMENT; Location: [facility]; Surgeon: Katrine Coho, MD   CORONARY ANGIOPLASTY WITH STENT PLACEMENT Left 09/17/2011   Procedure: CORONARY ANGIOPLASTY WITH STENT PLACEMENT; Location: [facility]; Surgeon: Katrine Coho, MD   CORONARY ANGIOPLASTY WITH STENT PLACEMENT Left 10/02/2011   Procedure: CORONARY ANGIOPLASTY WITH STENT PLACEMENT; Location: [facility]; Surgeon: Katrine Coho, MD   CORONARY ANGIOPLASTY WITH STENT PLACEMENT Left 08/19/2012   Procedure: CORONARY ANGIOPLASTY WITH STENT PLACEMENT; Location: [facility]; Surgeon: Kathlyn Sacramento, MD   ESOPHAGOGASTRODUODENOSCOPY (EGD) WITH PROPOFOL N/A 09/21/2018   Procedure: ESOPHAGOGASTRODUODENOSCOPY (EGD) WITH PROPOFOL;  Surgeon: Sarah Bellows, MD;  Location: [facility];  Service: Gastroenterology;  Laterality: N/A;   LEFT HEART CATH AND CORONARY ANGIOGRAPHY Left 08/02/2014   Procedure: LEFT HEART CATH AND CORONARY ANGIOGRAPHY; Location: [facility]; Surgeon:  Glenetta Hew, MD   PERIPHERAL VASCULAR ATHERECTOMY Left 09/12/2020   Procedure: PERIPHERAL VASCULAR ATHERECTOMY;  Surgeon: Wellington Hampshire, MD;  Location: [facility];  Service: Cardiovascular;  Laterality: Left;   PERIPHERAL VASCULAR BALLOON ANGIOPLASTY  09/12/2020   Procedure: PERIPHERAL VASCULAR BALLOON ANGIOPLASTY;  Surgeon: Wellington Hampshire, MD;  Location: [facility];  Service: Cardiovascular;;   REVERSE SHOULDER ARTHROPLASTY Left 10/01/2021   Procedure: REVERSE SHOULDER ARTHROPLASTY WITH BICEPS TENODESIS.;  Surgeon: Corky Mull, MD;  Location: [facility];  Service: Orthopedics;  Laterality: Left;   SHOULDER SURGERY Left 2017    Prior to Admission medications   Medication Sig Start Date End Date Taking? Authorizing Provider  albuterol (VENTOLIN HFA) 108 (90 Base) MCG/ACT inhaler Inhale 2 puffs into the lungs every 6 (six) hours as needed for wheezing or shortness of breath. 01/14/22   [name], NP  carvedilol (COREG) 3.125 MG tablet Take  1 tablet (3.125 mg total) by mouth 2 (two) times daily. 02/17/22   Dunn, Areta Haber, PA-C  clobetasol cream (TEMOVATE) [id number] % Apply 1 Application topically 2 (two) times daily as needed. For vaginal itching. 04/16/22   [name], NP  clopidogrel (PLAVIX) 75 MG tablet Take 1 tablet (75 mg total) by mouth daily. 02/17/22   Rise Mu, PA-C  Continuous Blood Gluc Receiver (FREESTYLE LIBRE 2 READER) DEVI Use with sensor to check blood sugars 6 times daily. 02/16/21   [name], NP  ezetimibe (ZETIA) 10 MG tablet Take 1 tablet (10 mg total) by mouth daily. For cholesterol. 04/12/21   [name] D, PA-C  famotidine (PEPCID) 20 MG tablet Take 1 tablet (20 mg total) by mouth at bedtime. For nausea and acid 09/19/22   [name], NP  furosemide (LASIX) 20 MG tablet Take 1 tablet (20 mg total) by mouth as needed (As needed for weight gain greater than 3 pounds overnight or increased shortness of breath.). Take one tab  only on Monday, Wednesday, & Fridays 08/29/22   Rise Mu, PA-C  gabapentin (NEURONTIN) 300 MG capsule Take 1 capsule (300 mg total) by mouth 2 (two) times daily. For pain 09/19/22   [name], NP  glucose blood (FREESTYLE LITE) test strip Test up to four times a day as needed. DX 11/27/20   [name], NP  HYDROcodone-acetaminophen (NORCO/VICODIN) 5-325 MG tablet Take 1 tablet by mouth every 4 (four) hours as needed. 09/11/22   Harvest Dark, MD  insulin aspart (NOVOLOG FLEXPEN) 100 UNIT/ML FlexPen Inject 18 units into the skin with breakfast, 14 units with lunch, and 16 units with dinner for diabetes. Sliding scal 09/04/22   Loletha Grayer, MD  insulin glargine (LANTUS SOLOSTAR) 100 UNIT/ML Solostar Pen Inject 10 Units into the skin daily. for diabetes. 09/19/22   [name], NP  Insulin Pen Needle (INSUPEN PEN NEEDLES) 32G X 4 MM MISC Use to inject insulin 4 times daily. 07/17/20   [name], NP  levothyroxine (SYNTHROID) 100 MCG tablet Take 100 mcg by mouth daily before breakfast.    [provider]  mirtazapine (REMERON) 7.5 MG tablet Take 1 tablet (7.5 mg total) by mouth at bedtime. For appetite and sleep. 10/11/21   [name], NP  ondansetron (ZOFRAN) 4 MG tablet Take 1 tablet (4 mg total) by mouth every 6 (six) hours as needed for nausea. Patient not taking: Reported on 09/19/2022 10/03/21   Lattie Corns, PA-C  ondansetron (ZOFRAN-ODT) 4 MG disintegrating tablet Take 1 tablet (4 mg total) by mouth every 8 (eight) hours as needed for nausea or vomiting. 09/11/22   Harvest Dark, MD  potassium chloride (KLOR-CON M) 10 MEQ tablet Only take on days you take your lasix. Take 1 tablet (10 mEq total) by mouth for 1 dose on those days you take your lasix '20mg'$  pill. 02/17/22   Dunn, Areta Haber, PA-C  umeclidinium-vilanterol (ANORO ELLIPTA) 62.5-25 MCG/ACT AEPB Inhale 1 puff into the lungs daily. 09/04/22   Loletha Grayer, MD    Family History   Problem Relation Age of Onset   Heart attack Mother        First MI @ 54 - Died @ 73   Heart disease Mother    Heart disease Father        Died @ 51   Throat cancer Brother    Liver cancer Brother    Colon cancer Sister  Social History   Tobacco Use   Smoking status: Every Day    Packs/day: 1.00    Years: 45.00    Total pack years: 45.00    Types: Cigarettes   Smokeless tobacco: Never   Tobacco comments:    Has cut back, trying to quit.   Vaping Use   Vaping Use: Never used  Substance Use Topics   Alcohol use: No    Alcohol/week: 0.0 standard drinks of alcohol   Drug use: Yes    Types: Marijuana    Comment: last noc    Allergies as of 09/24/2022   (No Known Allergies)    Review of Systems:    All systems reviewed and negative except where noted in HPI.   Physical Exam:  BP (!) 156/95   Pulse 89   Temp 98.8 F (37.1 C) (Oral)   Ht '5\' 6"'$  (1.676 m)   Wt 130 lb 9.6 oz (59.2 kg)   BMI 21.08 kg/m  No LMP recorded. Patient has had a hysterectomy. Psych:  Alert and cooperative. Normal mood and affect. General:   Alert,  Well-developed, well-nourished, pleasant and cooperative in NAD Head:  Normocephalic and atraumatic. Lungs:  Respirations even and unlabored.  Clear throughout to auscultation.   No wheezes, crackles, or rhonchi. No acute distress. Heart:  Regular rate and rhythm; no murmurs, clicks, rubs, or gallops. Abdomen:  Normal bowel sounds.  No bruits.  Soft, non-tender and non-distended without masses, hepatosplenomegaly or hernias noted.  No guarding or rebound tenderness.    Neurologic:  Alert and oriented x3;  grossly normal neurologically. Psych:  Alert and cooperative. Normal mood and affect.  Imaging Studies: CT Renal Stone Study  Result Date: 09/11/2022 CLINICAL DATA:  Flank pain EXAM: CT ABDOMEN AND PELVIS WITHOUT CONTRAST TECHNIQUE: Multidetector CT imaging of the abdomen and pelvis was performed following the standard protocol without IV  contrast. RADIATION DOSE REDUCTION: This exam was performed according to the departmental dose-optimization program which includes automated exposure control, adjustment of the mA and/or kV according to patient size and/or use of iterative reconstruction technique. COMPARISON:  09/03/2022 FINDINGS: Lower chest: Dependent subsegmental atelectasis. No pleural or pericardial effusion. Hepatobiliary: No focal liver abnormality is seen. No gallstones, gallbladder wall thickening, or biliary dilatation. Pancreas: Unremarkable. No pancreatic ductal dilatation or surrounding inflammatory changes. Spleen: Normal in size without focal abnormality. Adrenals/Urinary Tract: Several left-sided renal cysts measuring up to 3 cm. This finding does not need to be followed up. Unremarkable adrenal glands. No hydronephrosis. Intrarenal stone left kidney measures 4 mm. No hydronephrosis. Stomach/Bowel: Stomach is within normal limits. Appendix not identified and no evidence of appendicitis. No evidence of bowel wall thickening, distention, or inflammatory changes. Diverticulosis descending and sigmoid. Vascular/Lymphatic: Dense atheromatous calcifications of the aorta and its branches. No suspicious adenopathy identified. Reproductive: Status post hysterectomy. No adnexal masses. Other: No abdominal wall hernia or abnormality. No abdominopelvic ascites. Musculoskeletal: Thoracolumbosacral degenerative disc disease. Postop changes posterior pedicle fusion L3-S1. IMPRESSION: 1. Left kidney cysts. 2. Left kidney stone. 3. No hydronephrosis. 4. Diverticulosis. 5. Thoracolumbosacral degenerative changes and postop L3-S1 fusion. Electronically Signed   By: Sammie Bench M.D.   On: 09/11/2022 12:12   CT CHEST ABDOMEN PELVIS WO CONTRAST  Result Date: 09/03/2022 CLINICAL DATA:  Flank pain. Acute kidney injury. To rule out obstructive. Dizziness and nausea since last week. Back pain and shortness of breath. EXAM: CT CHEST, ABDOMEN AND  PELVIS WITHOUT CONTRAST TECHNIQUE: Multidetector CT imaging of the chest, abdomen and pelvis  was performed following the standard protocol without IV contrast. RADIATION DOSE REDUCTION: This exam was performed according to the departmental dose-optimization program which includes automated exposure control, adjustment of the mA and/or kV according to patient size and/or use of iterative reconstruction technique. COMPARISON:  CT chest 05/13/2021.  CT abdomen and pelvis 02/06/2016 FINDINGS: CT CHEST FINDINGS Cardiovascular: Normal heart size. No pericardial effusions. Normal caliber thoracic aorta. Calcification in the aorta and coronary arteries. Mediastinum/Nodes: Esophagus is decompressed. No significant lymphadenopathy. Thyroid gland is unremarkable. Lungs/Pleura: Lungs are clear. No pleural effusions. No pneumothorax. Musculoskeletal: Postoperative left shoulder replacement. Degenerative changes in the spine. CT ABDOMEN PELVIS FINDINGS Hepatobiliary: No focal liver abnormality is seen. No gallstones, gallbladder wall thickening, or biliary dilatation. Pancreas: Unremarkable. No pancreatic ductal dilatation or surrounding inflammatory changes. Spleen: Normal in size without focal abnormality. Adrenals/Urinary Tract: No adrenal gland nodules. Left renal cysts measuring up to 2.8 cm diameter, unchanged since prior study. No imaging follow-up is indicated. No hydronephrosis or hydroureter. Central renal calcifications appear to be vascular calcification. No renal or ureteral stone identified. Bladder is normal. Stomach/Bowel: Stomach is mildly distended and filled with ingested material, likely physiologic. No gastric wall thickening is appreciated. Small duodenal diverticulum. Small bowel and colon are not abnormally distended. No wall thickening or inflammatory changes are appreciated. Colonic diverticulosis without evidence of acute diverticulitis. Appendix is not identified. Vascular/Lymphatic: Aortic  atherosclerosis. No enlarged abdominal or pelvic lymph nodes. Reproductive: Status post hysterectomy. No adnexal masses. Other: No abdominal wall hernia or abnormality. No abdominopelvic ascites. Musculoskeletal: Postoperative lumbar fixation from L3 to the sacrum. Degenerative changes in the spine and hips. IMPRESSION: 1. No acute process demonstrated in the chest, abdomen, or pelvis. 2. Aortic atherosclerosis. Electronically Signed   By: Lucienne Capers M.D.   On: 09/03/2022 20:34   CT Head Wo Contrast  Result Date: 09/03/2022 CLINICAL DATA:  Vertigo EXAM: CT HEAD WITHOUT CONTRAST TECHNIQUE: Contiguous axial images were obtained from the base of the skull through the vertex without intravenous contrast. RADIATION DOSE REDUCTION: This exam was performed according to the departmental dose-optimization program which includes automated exposure control, adjustment of the mA and/or kV according to patient size and/or use of iterative reconstruction technique. COMPARISON:  None Available. FINDINGS: Brain: No acute territorial infarction, hemorrhage or intracranial mass. Moderate white matter hypodensity consistent with chronic small vessel disease. Normal ventricle size. Vascular: No hyperdense vessels.  Carotid vascular calcification Skull: Normal. Negative for fracture or focal lesion. Sinuses/Orbits: No acute finding. Other: None IMPRESSION: 1. No CT evidence for acute intracranial abnormality. 2. 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 Electronically Signed   By: Donavan Foil M.D.   On: 09/03/2022 18:51    Assessment and Plan:   Sarah Payne is a 77 y.o. y/o female has been referred for abnormal LFTs.  In addition has lost over 16 pounds of weight recently CT chest abdomen pelvis shows no abnormality.  Plan 1.  Await ultrasound results 2.  Full autoimmune and viral hepatitis workup for abnormal LFTs 3.  Abnormal weight loss suggested EGD and colonoscopy she is not keen on a  colonoscopy but is willing to proceed with an EGD 4.  For her regurgitation commence on Prilosec 40 mg twice daily new prescription will be provided 5.  If regurgitation persists next visit and no abnormality seen will proceed with HIDA scan.  Will try to reiterate the importance of a colonoscopy for weight loss at next visit   I have discussed alternative  options, risks & benefits,  which include, but are not limited to, bleeding, infection, perforation,respiratory complication & drug reaction.  The patient agrees with this plan & written consent will be obtained.      Follow up in 3-4 weeks   Dr Sarah Bellows MD,MRCP(U.K)

## 2022-09-25 ENCOUNTER — Telehealth: Payer: Self-pay

## 2022-09-25 ENCOUNTER — Telehealth: Payer: Self-pay | Admitting: Cardiovascular Disease

## 2022-09-25 LAB — NUCLEOTIDASE, 5', BLOOD: 5-Nucleotidase: 10 U/L (ref 0–10)

## 2022-09-25 NOTE — Patient Outreach (Signed)
  Care Coordination   09/25/2022 Name: Sarah Payne MRN: [id number] DOB: 1946/04/25   Care Coordination Outreach Attempts:  An unsuccessful telephone outreach was attempted for a scheduled appointment today. HIPAA compliance message left with call back phone number.   Follow Up Plan:  Additional outreach attempts will be made to offer the patient care coordination information and services.   Encounter Outcome:  No Answer   Care Coordination Interventions:  No, not indicated    Quinn Plowman [facility] [place] [phone] direct line

## 2022-09-25 NOTE — Telephone Encounter (Signed)
   Pre-operative Risk Assessment    Patient Name: Sarah Payne  DOB: 07/08/1946 MRN: [id number]      Request for Surgical Clearance    Procedure:   NOT INDICATED  Date of Surgery:  Clearance 10/06/22                               Surgeon:  DR Bailey Mech ANNA Surgeon's Group or Practice Name:  [facility] Phone number:  [phone] Fax number:  [phone]  Type of Clearance Requested:   - Pharmacy:  Hold Clopidogrel (Plavix) prior and post procedure  Type of Anesthesia:  Not Indicated   Additional requests/questions:    Patsi Sears   09/25/2022, 9:33 AM

## 2022-09-25 NOTE — Telephone Encounter (Signed)
   Primary Cardiologist: Ida Rogue, MD  Chart reviewed as part of pre-operative protocol coverage. Given past medical history and time since last visit, based on ACC/AHA guidelines, Sarah Payne would be at acceptable risk for the planned procedure without further cardiovascular testing.   Patient was advised that if she develops new symptoms prior to surgery to contact our office to arrange a follow-up appointment. She verbalized understanding.  Per Dr Rockey Situ:  Acceptable risk for procedure  Would hold Plavix 5 days prior to surgery  Would take aspirin on days when not taking Plavix  Restart Plavix following procedure, at that point could hold the aspirin   I will route this recommendation to the requesting party via [place] fax function and remove from pre-op pool.  Please call with questions.  Emmaline Life, NP-C  09/25/2022, 1:51 PM [address], [address] Office [phone] Fax [phone]

## 2022-09-29 LAB — COMPREHENSIVE METABOLIC PANEL
ALT: 235 IU/L — ABNORMAL HIGH (ref 0–32)
AST: 94 IU/L — ABNORMAL HIGH (ref 0–40)
Albumin/Globulin Ratio: 1.8 (ref 1.2–2.2)
Albumin: 4.2 g/dL (ref 3.8–4.8)
Alkaline Phosphatase: 134 IU/L — ABNORMAL HIGH (ref 44–121)
BUN/Creatinine Ratio: 14 (ref 12–28)
BUN: 22 mg/dL (ref 8–27)
Bilirubin Total: <0.2 mg/dL (ref 0.0–1.2)
CO2: 21 mmol/L (ref 20–29)
Calcium: 9.4 mg/dL (ref 8.7–10.3)
Chloride: 99 mmol/L (ref 96–106)
Creatinine, Ser: 1.56 mg/dL — ABNORMAL HIGH (ref 0.57–1.00)
Globulin, Total: 2.3 g/dL (ref 1.5–4.5)
Glucose: 338 mg/dL — ABNORMAL HIGH (ref 70–99)
Potassium: 4.3 mmol/L (ref 3.5–5.2)
Sodium: 137 mmol/L (ref 134–144)
Total Protein: 6.5 g/dL (ref 6.0–8.5)
eGFR: 35 mL/min/{1.73_m2} — ABNORMAL LOW (ref >59)

## 2022-09-29 LAB — MITOCHONDRIAL/SMOOTH MUSCLE AB PNL
Mitochondrial Ab: <20 Units (ref 0.0–20.0)
Smooth Muscle Ab: 5 Units (ref 0–19)

## 2022-09-29 LAB — CELIAC DISEASE AB SCREEN W/RFX
Antigliadin Abs, IgA: 6 units (ref 0–19)
Transglutaminase IgA: <2 U/mL (ref 0–3)

## 2022-09-29 LAB — IRON,TIBC AND FERRITIN PANEL
Ferritin: 89 ng/mL (ref 15–150)
Iron Saturation: 20 % (ref 15–55)
Iron: 63 ug/dL (ref 27–139)
Total Iron Binding Capacity: 316 ug/dL (ref 250–450)
UIBC: 253 ug/dL (ref 118–369)

## 2022-09-29 LAB — IMMUNOGLOBULINS A/E/G/M, SERUM
IgA/Immunoglobulin A, Serum: 222 mg/dL (ref 64–422)
IgE (Immunoglobulin E), Serum: <2 IU/mL — ABNORMAL LOW (ref 6–495)
IgG (Immunoglobin G), Serum: 675 mg/dL (ref 586–1602)
IgM (Immunoglobulin M), Srm: 129 mg/dL (ref 26–217)

## 2022-09-29 LAB — HIV ANTIBODY (ROUTINE TESTING W REFLEX): HIV Screen 4th Generation wRfx: NONREACTIVE

## 2022-09-29 LAB — HEPATITIS B E ANTIBODY: Hep B E Ab: NEGATIVE

## 2022-09-29 LAB — CK: Total CK: 108 U/L (ref 32–182)

## 2022-09-29 LAB — HEPATITIS B CORE ANTIBODY, TOTAL: Hep B Core Total Ab: NEGATIVE

## 2022-09-29 LAB — ANA: Anti Nuclear Antibody (ANA): NEGATIVE

## 2022-09-29 LAB — HEPATITIS B E ANTIGEN: Hep B E Ag: NEGATIVE

## 2022-09-29 LAB — ANTI-MICROSOMAL ANTIBODY LIVER / KIDNEY: LKM1 Ab: 0.9 Units (ref 0.0–20.0)

## 2022-09-29 LAB — HEPATITIS B SURFACE ANTIBODY,QUALITATIVE: Hep B Surface Ab, Qual: NONREACTIVE

## 2022-09-29 LAB — ALPHA-1-ANTITRYPSIN: A-1 Antitrypsin: 182 mg/dL (ref 101–187)

## 2022-09-29 LAB — CERULOPLASMIN: Ceruloplasmin: 21.3 mg/dL (ref 19.0–39.0)

## 2022-09-29 LAB — HEPATITIS A ANTIBODY, TOTAL: hep A Total Ab: POSITIVE — AB

## 2022-09-30 ENCOUNTER — Other Ambulatory Visit: Payer: Self-pay | Admitting: Primary Care

## 2022-09-30 DIAGNOSIS — E1151 Type 2 diabetes mellitus with diabetic peripheral angiopathy without gangrene: Secondary | ICD-10-CM

## 2022-09-30 NOTE — Telephone Encounter (Signed)
Pt called checking on the status of the med refill. Pt states he will run out of meds today. Pt also wanted to let Carlis Abbott know she still feels bad. Call back # [phone]

## 2022-09-30 NOTE — Telephone Encounter (Signed)
Called and spoke to patient she's stated that as Anda Kraft requested she was updating her with how she was doing. She states she is not doing well, she was seen by GI and they started her on Prilosec. She has an ultrasound tomorrow and they are doing the ESOPHAGOGASTRODUODENOSCOPY on 10/06/22. She still feels very sick to her stomach and not able to eat. She has stopped vomiting, and she's taking Zofran for that and the nausea. She declines appointment at [facility] at this time, she stated she just wanted to update Anda Kraft with how she's doing.   Matt- Can you refill this prescription for Novolog in [place] absence? Patient states she will run out of this today.

## 2022-10-01 ENCOUNTER — Ambulatory Visit
Admission: RE | Admit: 2022-10-01 | Discharge: 2022-10-01 | Disposition: A | Discharge status: Home / Self Care | Payer: Medicare PPO | Source: Ambulatory Visit | Attending: Gastroenterology | Admitting: Gastroenterology

## 2022-10-01 DIAGNOSIS — R945 Abnormal results of liver function studies: Secondary | ICD-10-CM | POA: Diagnosis not present

## 2022-10-01 DIAGNOSIS — R7989 Other specified abnormal findings of blood chemistry: Secondary | ICD-10-CM | POA: Diagnosis not present

## 2022-10-01 NOTE — Telephone Encounter (Signed)
Refills sent to pharmacy. 

## 2022-10-06 ENCOUNTER — Encounter: Payer: Self-pay | Admitting: Gastroenterology

## 2022-10-06 ENCOUNTER — Ambulatory Visit
Admission: RE | Admit: 2022-10-06 | Discharge: 2022-10-06 | Disposition: A | Discharge status: Home / Self Care | Payer: Medicare PPO | Source: Ambulatory Visit | Attending: Gastroenterology | Admitting: Gastroenterology

## 2022-10-06 ENCOUNTER — Ambulatory Visit: Payer: Medicare PPO | Admitting: Certified Registered"

## 2022-10-06 ENCOUNTER — Encounter
Admission: RE | Disposition: A | Discharge status: Home / Self Care | Payer: Self-pay | Source: Ambulatory Visit | Attending: Gastroenterology

## 2022-10-06 DIAGNOSIS — F0153 Vascular dementia, unspecified severity, with mood disturbance: Secondary | ICD-10-CM | POA: Diagnosis not present

## 2022-10-06 DIAGNOSIS — I110 Hypertensive heart disease with heart failure: Secondary | ICD-10-CM | POA: Diagnosis not present

## 2022-10-06 DIAGNOSIS — Z794 Long term (current) use of insulin: Secondary | ICD-10-CM | POA: Diagnosis not present

## 2022-10-06 DIAGNOSIS — J449 Chronic obstructive pulmonary disease, unspecified: Secondary | ICD-10-CM | POA: Insufficient documentation

## 2022-10-06 DIAGNOSIS — G4733 Obstructive sleep apnea (adult) (pediatric): Secondary | ICD-10-CM | POA: Diagnosis not present

## 2022-10-06 DIAGNOSIS — R634 Abnormal weight loss: Secondary | ICD-10-CM

## 2022-10-06 DIAGNOSIS — E039 Hypothyroidism, unspecified: Secondary | ICD-10-CM | POA: Insufficient documentation

## 2022-10-06 DIAGNOSIS — I2510 Atherosclerotic heart disease of native coronary artery without angina pectoris: Secondary | ICD-10-CM | POA: Diagnosis not present

## 2022-10-06 DIAGNOSIS — K219 Gastro-esophageal reflux disease without esophagitis: Secondary | ICD-10-CM | POA: Diagnosis not present

## 2022-10-06 DIAGNOSIS — F32A Depression, unspecified: Secondary | ICD-10-CM | POA: Insufficient documentation

## 2022-10-06 DIAGNOSIS — E785 Hyperlipidemia, unspecified: Secondary | ICD-10-CM | POA: Diagnosis not present

## 2022-10-06 DIAGNOSIS — E1165 Type 2 diabetes mellitus with hyperglycemia: Secondary | ICD-10-CM | POA: Insufficient documentation

## 2022-10-06 DIAGNOSIS — I509 Heart failure, unspecified: Secondary | ICD-10-CM | POA: Diagnosis not present

## 2022-10-06 DIAGNOSIS — I252 Old myocardial infarction: Secondary | ICD-10-CM | POA: Insufficient documentation

## 2022-10-06 DIAGNOSIS — Z7902 Long term (current) use of antithrombotics/antiplatelets: Secondary | ICD-10-CM | POA: Diagnosis not present

## 2022-10-06 DIAGNOSIS — Z79899 Other long term (current) drug therapy: Secondary | ICD-10-CM | POA: Diagnosis not present

## 2022-10-06 DIAGNOSIS — G4730 Sleep apnea, unspecified: Secondary | ICD-10-CM | POA: Diagnosis not present

## 2022-10-06 DIAGNOSIS — F17210 Nicotine dependence, cigarettes, uncomplicated: Secondary | ICD-10-CM | POA: Insufficient documentation

## 2022-10-06 DIAGNOSIS — Z6821 Body mass index (BMI) 21.0-21.9, adult: Secondary | ICD-10-CM | POA: Diagnosis not present

## 2022-10-06 HISTORY — PX: ESOPHAGOGASTRODUODENOSCOPY (EGD) WITH PROPOFOL: SHX5813

## 2022-10-06 LAB — GLUCOSE, CAPILLARY: Glucose-Capillary: 111 mg/dL — ABNORMAL HIGH (ref 70–99)

## 2022-10-06 SURGERY — ESOPHAGOGASTRODUODENOSCOPY (EGD) WITH PROPOFOL
Anesthesia: General

## 2022-10-06 MED ORDER — SODIUM CHLORIDE 0.9 % IV SOLN: INTRAVENOUS | Status: DC

## 2022-10-06 MED ORDER — PROPOFOL 10 MG/ML IV BOLUS
INTRAVENOUS | Status: DC | PRN
  Administered 2022-10-06: 80 mg via INTRAVENOUS

## 2022-10-06 MED ORDER — DEXMEDETOMIDINE HCL IN NACL 80 MCG/20ML IV SOLN
INTRAVENOUS | Status: DC | PRN
  Administered 2022-10-06: 8 ug via BUCCAL

## 2022-10-06 MED ORDER — DEXMEDETOMIDINE HCL IN NACL 80 MCG/20ML IV SOLN
INTRAVENOUS | Status: AC
  Filled 2022-10-06: qty 20

## 2022-10-06 MED ORDER — LIDOCAINE HCL (CARDIAC) PF 100 MG/5ML IV SOSY
PREFILLED_SYRINGE | INTRAVENOUS | Status: DC | PRN
  Administered 2022-10-06: 100 mg via INTRAVENOUS

## 2022-10-06 MED ORDER — PROPOFOL 10 MG/ML IV BOLUS
INTRAVENOUS | Status: AC
  Filled 2022-10-06: qty 20

## 2022-10-06 NOTE — Progress Notes (Signed)
Hi FYI kidney findings - incidental finding  No liver abnormalities  Bailey Mech

## 2022-10-06 NOTE — Anesthesia Postprocedure Evaluation (Signed)
Anesthesia Post Note  Patient: Sarah Payne  Procedure(s) Performed: ESOPHAGOGASTRODUODENOSCOPY (EGD) WITH PROPOFOL  Patient location during evaluation: Endoscopy Anesthesia Type: General Level of consciousness: awake and alert Pain management: pain level controlled Vital Signs Assessment: post-procedure vital signs reviewed and stable Respiratory status: spontaneous breathing, nonlabored ventilation, respiratory function stable and patient connected to nasal cannula oxygen Cardiovascular status: blood pressure returned to baseline and stable Postop Assessment: no apparent nausea or vomiting Anesthetic complications: no   No notable events documented.   Last Vitals:  Vitals:   10/06/22 1032 10/06/22 1116  BP: 130/75   Pulse: 73   Resp: 18   Temp: (!) 36.3 C (!) 36.2 C  SpO2: 98%     Last Pain:  Vitals:   10/06/22 1126  TempSrc:   PainSc: 0-No pain                 Ilene Qua

## 2022-10-06 NOTE — H&P (Signed)
Sarah Bellows, MD [address], [place], [place], [place], [address] [address], [place], [place], [place], [address] Phone: [phone]  Fax: [phone]  Primary Care Physician:  [name], NP   Pre-Procedure History & Physical: HPI:  Sarah Payne is a 77 y.o. female is here for an endoscopy    Past Medical History:  Diagnosis Date   Aortic atherosclerosis ([place])    Bilateral cataracts 01/2015   a.) s/p extraction   CHF (congestive heart failure) ([place]) 09/16/2013   a.) TTE 09/16/2013: EF 55-60%; mod inferior HK, triv TR; G1DD. b.) TTE 01/02/2016: EF 25-30%; mid-apicalateroseptal, lat, inf, inferoseptal, apical akinesis; triv TR; G1DD. c. TTE 04/16/2016: EF 65%; G1DD. d.) TTE 05/02/2020: EF 50-55%; G2DD. e.) TTE 04/11/2021: EF 55%; G1DD.   Chronic low back pain    Clostridioides difficile infection 2015   COPD (chronic obstructive pulmonary disease) (H[facility])    Coronary artery disease    a.) PCI 02/06/2011: 100% mLCx - DES x 3. b.) PCI 09/17/2011: 95% dRCA - DES x 1. c.) PCI 10/02/2011: 75% pLCX - DES x 1. d.) PCI 08/19/2012: DES x 2 p-mRCA.  e.) [place] 01/01/16: Takotsubo event 01/01/2016 with patent stents   Depression    Diverticulosis    Frequent PVCs    a. Noted in hospital 12/2015.   GERD (gastroesophageal reflux disease)    History of heart artery stent    a.) TOTAL of 7 stents: a.) 02/06/2011 - overlapping 2.5x12 mm Xience V, 2.5x8 mm Xience V, 2.25x8 mm Xience Nano to mLCx. b.) 09/17/2011 - 2.5x23 Xience V dRCA. c.) 10/02/2011 - 2.5x15 mm Xience V pLCx. d.) 08/09/2012 - overlapping 3.0x68mm mRCA and 3.5x28mm pRCA   Hyperlipidemia    Hypertension    Hypothyroidism    Impingement syndrome of left shoulder    LBBB (left bundle branch block)    Long term current use of antithrombotics/antiplatelets    a.) clopidogrel   Marijuana abuse    Myocardial infarction [facility])    a.) multiple MIs;  5 per her report   OSA (obstructive sleep apnea)    a.) mild; does not  require nocturnal PAP therapy   Paroxysmal SVT (supraventricular tachycardia) 05/08/2021   a.)  Zio patch 05/08/2021: 3 distinct SVT runs; fastest 5 beats at a rate of 146 bpm; longest 7 beats at rate of 134 bpm   T2DM (type 2 diabetes mellitus) ([place])    Takotsubo cardiomyopathy    a. 12/2015 - nephew committed suicide 1 week prior, sister died the morning of presentation - initially called a STEMI; cath with patent stents. LVEF 25-30%.   Tendonitis of left rotator cuff    Tobacco abuse    Vascular dementia     Past Surgical History:  Procedure Laterality Date   ABDOMINAL AORTOGRAM W/LOWER EXTREMITY N/A 09/12/2020   Procedure: ABDOMINAL AORTOGRAM W/LOWER EXTREMITY;  Surgeon: Wellington Hampshire, MD;  Location: [facility];  Service: Cardiovascular;  Laterality: N/A;   ABDOMINAL HYSTERECTOMY     APPENDECTOMY     BACK SURGERY     CARDIAC CATHETERIZATION N/A 01/01/2016   Procedure: Left Heart Cath and Coronary Angiography;  Surgeon: Jettie Booze, MD;  Location: [facility];  Service: Cardiovascular;  Laterality: N/A;   CATARACT EXTRACTION W/PHACO Left 01/30/2015   Procedure: CATARACT EXTRACTION PHACO AND INTRAOCULAR LENS PLACEMENT (IOC);  Surgeon: Birder Robson, MD;  Location: [facility];  Service: Ophthalmology;  Laterality: Left;  [place] 00:47  CATARACT EXTRACTION W/PHACO Right 02/13/2015   Procedure: CATARACT EXTRACTION PHACO AND INTRAOCULAR LENS PLACEMENT (IOC);  Surgeon: Birder Robson, MD;  Location: [facility];  Service: Ophthalmology;  Laterality: Right;  cassette lot # [id number] H [place]  00:29.9 AP  20.7 CDE  6.20   COLONOSCOPY N/A 11/02/2014   Procedure: COLONOSCOPY;  Surgeon: Inda Castle, MD;  Location: [facility];  Service: Endoscopy;  Laterality: N/A;   CORONARY ANGIOPLASTY WITH STENT PLACEMENT Left 02/06/2011   Procedure: CORONARY ANGIOPLASTY WITH STENT PLACEMENT; Location: [employer] [facility]; Surgeon: Katrine Coho, MD   CORONARY ANGIOPLASTY WITH STENT PLACEMENT Left  09/17/2011   Procedure: CORONARY ANGIOPLASTY WITH STENT PLACEMENT; Location: [facility]; Surgeon: Katrine Coho, MD   CORONARY ANGIOPLASTY WITH STENT PLACEMENT Left 10/02/2011   Procedure: CORONARY ANGIOPLASTY WITH STENT PLACEMENT; Location: [place]; Surgeon: Katrine Coho, MD   CORONARY ANGIOPLASTY WITH STENT PLACEMENT Left 08/19/2012   Procedure: CORONARY ANGIOPLASTY WITH STENT PLACEMENT; Location: [place]; Surgeon: Kathlyn Sacramento, MD   ESOPHAGOGASTRODUODENOSCOPY (EGD) WITH PROPOFOL N/A 09/21/2018   Procedure: ESOPHAGOGASTRODUODENOSCOPY (EGD) WITH PROPOFOL;  Surgeon: Sarah Bellows, MD;  Location: [facility];  Service: Gastroenterology;  Laterality: N/A;   LEFT HEART CATH AND CORONARY ANGIOGRAPHY Left 08/02/2014   Procedure: LEFT HEART CATH AND CORONARY ANGIOGRAPHY; Location: [facility]; Surgeon: Glenetta Hew, MD   PERIPHERAL VASCULAR ATHERECTOMY Left 09/12/2020   Procedure: PERIPHERAL VASCULAR ATHERECTOMY;  Surgeon: Wellington Hampshire, MD;  Location: [facility];  Service: Cardiovascular;  Laterality: Left;   PERIPHERAL VASCULAR BALLOON ANGIOPLASTY  09/12/2020   Procedure: PERIPHERAL VASCULAR BALLOON ANGIOPLASTY;  Surgeon: Wellington Hampshire, MD;  Location: [facility];  Service: Cardiovascular;;   REVERSE SHOULDER ARTHROPLASTY Left 10/01/2021   Procedure: REVERSE SHOULDER ARTHROPLASTY WITH BICEPS TENODESIS.;  Surgeon: Corky Mull, MD;  Location: [facility];  Service: Orthopedics;  Laterality: Left;   SHOULDER SURGERY Left 2017    Prior to Admission medications   Medication Sig Start Date End Date Taking? Authorizing Provider  albuterol (VENTOLIN HFA) 108 (90 Base) MCG/ACT inhaler Inhale 2 puffs into the lungs every 6 (six) hours as needed for wheezing or shortness of breath. 01/14/22   [name], NP  carvedilol (COREG) 3.125 MG tablet Take 1 tablet (3.125 mg total) by mouth 2 (two) times daily. 02/17/22   Dunn, Areta Haber, PA-C  clobetasol cream (TEMOVATE) [id number] % Apply 1  Application topically 2 (two) times daily as needed. For vaginal itching. 04/16/22   [name], NP  clopidogrel (PLAVIX) 75 MG tablet Take 1 tablet (75 mg total) by mouth daily. 02/17/22   Rise Mu, PA-C  Continuous Blood Gluc Receiver (FREESTYLE LIBRE 2 READER) DEVI Use with sensor to check blood sugars 6 times daily. 02/16/21   [name], NP  ezetimibe (ZETIA) 10 MG tablet Take 1 tablet (10 mg total) by mouth daily. For cholesterol. 04/12/21   [name] D, PA-C  famotidine (PEPCID) 20 MG tablet Take 1 tablet (20 mg total) by mouth at bedtime. For nausea and acid 09/19/22   [name], NP  furosemide (LASIX) 20 MG tablet Take 1 tablet (20 mg total) by mouth as needed (As needed for weight gain greater than 3 pounds overnight or increased shortness of breath.). Take one tab only on Monday, Wednesday, & Fridays 08/29/22   Rise Mu, PA-C  gabapentin (NEURONTIN) 300 MG capsule Take 1 capsule (300 mg total) by mouth 2 (two) times daily. For pain 09/19/22   [name], NP  glucose blood (FREESTYLE  LITE) test strip Test up to four times a day as needed. DX 11/27/20   [name], NP  HYDROcodone-acetaminophen (NORCO/VICODIN) 5-325 MG tablet Take 1 tablet by mouth every 4 (four) hours as needed. 09/11/22   Harvest Dark, MD  insulin aspart (NOVOLOG FLEXPEN) 100 UNIT/ML FlexPen INJECT 10 UNITS INTO THE SKIN WITH BREAKFAST, 18 UNITS WITH LUNCH, AND 16 UNITS WITH DINNER FOR DIABETES 10/01/22   [name], NP  insulin glargine (LANTUS SOLOSTAR) 100 UNIT/ML Solostar Pen Inject 10 Units into the skin daily. for diabetes. 09/19/22   [name], NP  Insulin Pen Needle (INSUPEN PEN NEEDLES) 32G X 4 MM MISC Use to inject insulin 4 times daily. 07/17/20   [name], NP  levothyroxine (SYNTHROID) 100 MCG tablet Take 100 mcg by mouth daily before breakfast.    [provider]  mirtazapine (REMERON) 7.5 MG tablet Take 1 tablet (7.5 mg total) by  mouth at bedtime. For appetite and sleep. 10/11/21   [name], NP  omeprazole (PRILOSEC) 40 MG capsule Take 1 capsule (40 mg total) by mouth daily. 09/24/22   Sarah Bellows, MD  ondansetron (ZOFRAN) 4 MG tablet Take 1 tablet (4 mg total) by mouth every 6 (six) hours as needed for nausea. 10/03/21   Lattie Corns, PA-C  ondansetron (ZOFRAN-ODT) 4 MG disintegrating tablet Take 1 tablet (4 mg total) by mouth every 8 (eight) hours as needed for nausea or vomiting. 09/11/22   Harvest Dark, MD  potassium chloride (KLOR-CON M) 10 MEQ tablet Only take on days you take your lasix. Take 1 tablet (10 mEq total) by mouth for 1 dose on those days you take your lasix 20mg  pill. 02/17/22   Dunn, Areta Haber, PA-C  umeclidinium-vilanterol (ANORO ELLIPTA) 62.5-25 MCG/ACT AEPB Inhale 1 puff into the lungs daily. 09/04/22   Loletha Grayer, MD    Allergies as of 09/24/2022   (No Known Allergies)    Family History  Problem Relation Age of Onset   Heart attack Mother        First MI @ 62 - Died @ 71   Heart disease Mother    Heart disease Father        Died @ 3   Throat cancer Brother    Liver cancer Brother    Colon cancer Sister     Social History   Socioeconomic History   Marital status: Married    Spouse name: Not on file   Number of children: Not on file   Years of education: Not on file   Highest education level: Not on file  Occupational History   Not on file  Tobacco Use   Smoking status: Every Day    Packs/day: 1.00    Years: 45.00    Additional pack years: 0.00    Total pack years: 45.00    Types: Cigarettes   Smokeless tobacco: Never   Tobacco comments:    Has cut back, trying to quit.   Vaping Use   Vaping Use: Never used  Substance and Sexual Activity   Alcohol use: No    Alcohol/week: 0.0 standard drinks of alcohol   Drug use: Yes    Types: Marijuana    Comment: last noc   Sexual activity: Not on file  Other Topics Concern   Not on file  Social History  Narrative   Lives at home with her husband in [place].  Previously used marijuana - quit.      Regular  exercise: no/ pain from a frozen rotator cuff   Caffeine use: coffee daily and pepsi      Does not have a living will.   Daughters and husband know her wishes- would desire CPR but not prolonged life support if futile   Social Determinants of Health   Financial Resource Strain: Low Risk  (08/06/2022)   Overall Financial Resource Strain (CARDIA)    Difficulty of Paying Living Expenses: Not hard at all  Food Insecurity: No Food Insecurity (09/04/2022)   Hunger Vital Sign    Worried About Running Out of Food in the Last Year: Never true    Ran Out of Food in the Last Year: Never true  Transportation Needs: No Transportation Needs (09/04/2022)   PRAPARE - Hydrologist (Medical): No    Lack of Transportation (Non-Medical): No  Physical Activity: Inactive (08/06/2022)   Exercise Vital Sign    Days of Exercise per Week: 0 days    Minutes of Exercise per Session: 0 min  Stress: No Stress Concern Present (08/06/2022)   [place]    Feeling of Stress : Only a little  Social Connections: Socially Isolated (08/06/2022)   Social Connection and Isolation Panel [NHANES]    Frequency of Communication with Friends and Family: Never    Frequency of Social Gatherings with Friends and Family: Never    Attends Religious Services: Never    Marine scientist or Organizations: No    Attends Archivist Meetings: Never    Marital Status: Married  Human resources officer Violence: Not At Risk (09/04/2022)   Humiliation, Afraid, Rape, and Kick questionnaire    Fear of Current or Ex-Partner: No    Emotionally Abused: No    Physically Abused: No    Sexually Abused: No    Review of Systems: See HPI, otherwise negative ROS  Physical Exam: There were no vitals taken for this visit. General:   Alert,   pleasant and cooperative in NAD Head:  Normocephalic and atraumatic. Neck:  Supple; no masses or thyromegaly. Lungs:  Clear throughout to auscultation, normal respiratory effort.    Heart:  +S1, +S2, Regular rate and rhythm, No edema. Abdomen:  Soft, nontender and nondistended. Normal bowel sounds, without guarding, and without rebound.   Neurologic:  Alert and  oriented x4;  grossly normal neurologically.  Impression/Plan: Sarah Payne is here for an endoscopy  to be performed for  evaluation of weight loss    Risks, benefits, limitations, and alternatives regarding endoscopy have been reviewed with the patient.  Questions have been answered.  All parties agreeable.   Sarah Bellows, MD  10/06/2022, 10:13 AM

## 2022-10-06 NOTE — Op Note (Signed)
[facility] Gastroenterology Patient Name: Sarah Payne Procedure Date: 10/06/2022 10:59 AM MRN: [id number] Account #: [id number] Date of Birth: 10-30-1945 Admit Type: Outpatient Age: 77 Room: [facility] ENDO ROOM 1 Gender: Female Note Status: Finalized Instrument Name: Upper Endoscope [id number] Procedure:             Upper GI endoscopy Indications:           Weight loss Providers:             [name] MD, MD Referring MD:          [name] (Referring MD) Medicines:             Monitored Anesthesia Care Complications:         No immediate complications. Procedure:             Pre-Anesthesia Assessment:                        - Prior to the procedure, a History and Physical was                         performed, and patient medications, allergies and                         sensitivities were reviewed. The patient's tolerance                         of previous anesthesia was reviewed.                        - The risks and benefits of the procedure and the                         sedation options and risks were discussed with the                         patient. All questions were answered and informed                         consent was obtained.                        - ASA Grade Assessment: II - A patient with mild                         systemic disease.                        After obtaining informed consent, the endoscope was                         passed under direct vision. Throughout the procedure,                         the patient's blood pressure, pulse, and oxygen                         saturations were monitored continuously. The Endoscope                         was introduced  through the mouth, and advanced to the                         third part of duodenum. The upper GI endoscopy was                         accomplished with ease. The patient tolerated the                         procedure well. Findings:      The esophagus was  normal.      The stomach was normal.      The examined duodenum was normal. Impression:            - Normal esophagus.                        - Normal stomach.                        - Normal examined duodenum.                        - No specimens collected. Recommendation:        - Discharge patient to home (with escort).                        - Resume previous diet.                        - Continue present medications.                        - Return to my office as previously scheduled. Procedure Code(s):     --- Professional ---                        [phone], Esophagogastroduodenoscopy, flexible,                         transoral; diagnostic, including collection of                         specimen(s) by brushing or washing, when performed                         (separate procedure) Diagnosis Code(s):     --- Professional ---                        R63.4, Abnormal weight loss CPT copyright 2022 American Medical Association. All rights reserved. The codes documented in this report are preliminary and upon coder review may  be revised to meet current compliance requirements. [name], MD [name] MD, MD 10/06/2022 11:11:28 AM This report has been signed electronically. Number of Addenda: 0 Note Initiated On: 10/06/2022 10:59 AM Estimated Blood Loss:  Estimated blood loss: none.      [facility]

## 2022-10-06 NOTE — Transfer of Care (Signed)
Immediate Anesthesia Transfer of Care Note  Patient: Sarah Payne  Procedure(s) Performed: ESOPHAGOGASTRODUODENOSCOPY (EGD) WITH PROPOFOL  Patient Location: PACU  Anesthesia Type:General  Level of Consciousness: drowsy  Airway & Oxygen Therapy: Patient Spontanous Breathing  Post-op Assessment: Report given to RN and Post -op Vital signs reviewed and stable  Post vital signs: Reviewed and stable  Last Vitals:  Vitals Value Taken Time  BP 119/68 10/06/22 1116  Temp    Pulse 59 10/06/22 1117  Resp 24 10/06/22 1117  SpO2 100 % 10/06/22 1117  Vitals shown include unvalidated device data.  Last Pain:  Vitals:   10/06/22 1032  TempSrc: Temporal  PainSc: 0-No pain         Complications: No notable events documented.

## 2022-10-06 NOTE — Anesthesia Preprocedure Evaluation (Signed)
Anesthesia Evaluation  Patient identified by MRN, date of birth, ID band Patient awake    Reviewed: Allergy & Precautions, NPO status , Patient's Chart, lab work & pertinent test results  Airway Mallampati: III  TM Distance: >3 FB Neck ROM: full    Dental  (+) Edentulous Upper, Edentulous Lower   Pulmonary sleep apnea , COPD,  COPD inhaler, Current Smoker and Patient abstained from smoking.   Pulmonary exam normal        Cardiovascular Exercise Tolerance: Good hypertension, Pt. on medications + CAD, + Past MI ( 5 per her report, last 2017) and +CHF (TTE 04/11/2021: EF 55%; G1DD)  Normal cardiovascular exam+ dysrhythmias (frequent PVCs, LBBB)   history of CAD PCI/DES to the distal RCA in 08/2011, mid LCx in 09/2011, and PCI/DES x2 to the RCA in 07/2012, HFimpEF, PAD status post orbital atherectomy and drug-coated balloon angioplasty to the left SFA in 08/2020, DM2, CKD stage IIIb, intermittent LBBB, HTN, HLD, COPD, hypothyroidism, orthostatic dizziness versus vertigo, and sleep apnea who presents for follow-up of her CAD.    Neuro/Psych  PSYCHIATRIC DISORDERS (vascular dementia)  Depression   Dementia negative neurological ROS     GI/Hepatic ,GERD (pt report rare episodes of coughing with the sensation foreign material in chest. GERD is on her problem list. )  ,,(+)     substance abuse  marijuana use  Endo/Other  diabetes, Poorly Controlled, Type 2, Insulin DependentHypothyroidism    Renal/GU Renal InsufficiencyRenal disease     Musculoskeletal  (+) Arthritis , Osteoarthritis,    Abdominal Normal abdominal exam  (+)   Peds  Hematology negative hematology ROS (+)   Anesthesia Other Findings Past Medical History: No date: Aortic atherosclerosis ([place]) 01/2015: Bilateral cataracts     Comment:  a.) s/p extraction 09/16/2013: CHF (congestive heart failure) ([place])     Comment:  a.) TTE 09/16/2013: EF 55-60%; mod inferior HK,  triv TR;              G1DD. b.) TTE 01/02/2016: EF 25-30%;               mid-apicalateroseptal, lat, inf, inferoseptal, apical               akinesis; triv TR; G1DD. c. TTE 04/16/2016: EF 65%; G1DD.              d.) TTE 05/02/2020: EF 50-55%; G2DD. e.) TTE 04/11/2021:               EF 55%; G1DD. No date: Chronic low back pain 2015: Clostridioides difficile infection No date: COPD (chronic obstructive pulmonary disease) (HCC) No date: Coronary artery disease     Comment:  a.) PCI 02/06/2011: 100% mLCx - DES x 3. b.) PCI               09/17/2011: 95% dRCA - DES x 1. c.) PCI 10/02/2011: 75%               pLCX - DES x 1. d.) PCI 08/19/2012: DES x 2 p-mRCA.  e.)               LHC 01/01/16: Takotsubo event 01/01/2016 with patent stents No date: Depression No date: Diverticulosis No date: Frequent PVCs     Comment:  a. Noted in hospital 12/2015. No date: GERD (gastroesophageal reflux disease) No date: History of heart artery stent     Comment:  a.) TOTAL of 7 stents: a.) 02/06/2011 - overlapping  2.5x12 mm Xience V, 2.5x8 mm Xience V, 2.25x8 mm Xience               Nano to mLCx. b.) 09/17/2011 - 2.5x23 Xience V dRCA. c.)               10/02/2011 - 2.5x15 mm Xience V pLCx. d.) 08/09/2012 -               overlapping 3.0x70mm mRCA and 3.5x109mm pRCA No date: Hyperlipidemia No date: Hypertension No date: Hypothyroidism No date: Impingement syndrome of left shoulder No date: LBBB (left bundle branch block) No date: Long term current use of antithrombotics/antiplatelets     Comment:  a.) clopidogrel No date: Marijuana abuse No date: Myocardial infarction [facility])     Comment:  a.) multiple MIs;  5 per her report No date: OSA (obstructive sleep apnea)     Comment:  a.) mild; does not require nocturnal PAP therapy 05/08/2021: Paroxysmal SVT (supraventricular tachycardia) ([place])     Comment:  a.)  Zio patch 05/08/2021: 3 distinct SVT runs; fastest               5 beats at a rate of 146  bpm; longest 7 beats at rate of               134 bpm No date: T2DM (type 2 diabetes mellitus) ([place]) No date: Takotsubo cardiomyopathy     Comment:  a. 12/2015 - nephew committed suicide 1 week prior,               sister died the morning of presentation - initially               called a STEMI; cath with patent stents. LVEF 25-30%. No date: Tendonitis of left rotator cuff No date: Tobacco abuse No date: Vascular dementia  Past Surgical History: 09/12/2020: ABDOMINAL AORTOGRAM W/LOWER EXTREMITY; N/A     Comment:  Procedure: ABDOMINAL AORTOGRAM W/LOWER EXTREMITY;                Surgeon: Wellington Hampshire, MD;  Location: [place] [facility]              LAB;  Service: Cardiovascular;  Laterality: N/A; No date: ABDOMINAL HYSTERECTOMY No date: APPENDECTOMY No date: BACK SURGERY 01/01/2016: CARDIAC CATHETERIZATION; N/A     Comment:  Procedure: Left Heart Cath and Coronary Angiography;                Surgeon: Jettie Booze, MD;  Location: [place]               [facility] LAB;  Service: Cardiovascular;  Laterality: N/A; 01/30/2015: CATARACT EXTRACTION W/PHACO; Left     Comment:  Procedure: CATARACT EXTRACTION PHACO AND INTRAOCULAR               LENS PLACEMENT (IOC);  Surgeon: Birder Robson, MD;                Location: [facility];  Service: Ophthalmology;  Laterality:              Left;  [place] 00:47  02/13/2015: CATARACT EXTRACTION W/PHACO; Right     Comment:  Procedure: CATARACT EXTRACTION PHACO AND INTRAOCULAR               LENS PLACEMENT (IOC);  Surgeon: Birder Robson, MD;                Location: [facility];  Service: Ophthalmology;  Laterality:  Right;  cassette lot # [id number] H [place]  00:29.9 AP  20.7 CDE               6.20 11/02/2014: COLONOSCOPY; N/A     Comment:  Procedure: COLONOSCOPY;  Surgeon: Inda Castle, MD;                Location: [place];  Service: Endoscopy;  Laterality:              N/A; 02/06/2011: CORONARY ANGIOPLASTY WITH STENT PLACEMENT; Left      Comment:  Procedure: CORONARY ANGIOPLASTY WITH STENT PLACEMENT;               Location: [place]; Surgeon: Katrine Coho, MD 09/17/2011: CORONARY ANGIOPLASTY WITH STENT PLACEMENT; Left     Comment:  Procedure: CORONARY ANGIOPLASTY WITH STENT PLACEMENT;               Location: [place]; Surgeon: Katrine Coho, MD 10/02/2011: CORONARY ANGIOPLASTY WITH STENT PLACEMENT; Left     Comment:  Procedure: CORONARY ANGIOPLASTY WITH STENT PLACEMENT;               Location: [place]; Surgeon: Katrine Coho, MD 08/19/2012: CORONARY ANGIOPLASTY WITH STENT PLACEMENT; Left     Comment:  Procedure: CORONARY ANGIOPLASTY WITH STENT PLACEMENT;               Location: [place]; Surgeon: Kathlyn Sacramento, MD 09/21/2018: ESOPHAGOGASTRODUODENOSCOPY (EGD) WITH PROPOFOL; N/A     Comment:  Procedure: ESOPHAGOGASTRODUODENOSCOPY (EGD) WITH               PROPOFOL;  Surgeon: [name], MD;  Location: [facility]               ENDOSCOPY;  Service: Gastroenterology;  Laterality: N/A; 08/02/2014: LEFT HEART CATH AND CORONARY ANGIOGRAPHY; Left     Comment:  Procedure: LEFT HEART CATH AND CORONARY ANGIOGRAPHY;               Location: [facility] [place]; Surgeon: Glenetta Hew, MD 09/12/2020: PERIPHERAL VASCULAR ATHERECTOMY; Left     Comment:  Procedure: PERIPHERAL VASCULAR ATHERECTOMY;  Surgeon:               Wellington Hampshire, MD;  Location: [place] [facility];                Service: Cardiovascular;  Laterality: Left; 09/12/2020: PERIPHERAL VASCULAR BALLOON ANGIOPLASTY     Comment:  Procedure: PERIPHERAL VASCULAR BALLOON ANGIOPLASTY;                Surgeon: Wellington Hampshire, MD;  Location: [place] [facility]              LAB;  Service: Cardiovascular;; 2017: SHOULDER SURGERY; Left     Reproductive/Obstetrics negative OB ROS                              Anesthesia Physical Anesthesia Plan  ASA: 3  Anesthesia Plan: General   Post-op Pain Management:    Induction: Intravenous  PONV Risk Score and Plan: Propofol  infusion and TIVA  Airway Management Planned: Natural Airway and Nasal Cannula  Additional Equipment:   Intra-op Plan:   Post-operative Plan:   Informed Consent: I have reviewed the patients History and Physical, chart, labs and discussed the procedure including the risks, benefits and alternatives for the proposed anesthesia with the patient or authorized representative who has indicated his/her understanding and acceptance.     Dental Advisory  Given  Plan Discussed with: Anesthesiologist, CRNA and Surgeon  Anesthesia Plan Comments: (Patient consented for risks of anesthesia including but not limited to:  - adverse reactions to medications - risk of airway placement if required - damage to eyes, teeth, lips or other oral mucosa - nerve damage due to positioning  - sore throat or hoarseness - Damage to heart, brain, nerves, lungs, other parts of body or loss of life  Patient voiced understanding.)         Anesthesia Quick Evaluation

## 2022-10-06 NOTE — Progress Notes (Signed)
Inform lfts are getting better

## 2022-10-07 ENCOUNTER — Encounter: Payer: Self-pay | Admitting: Gastroenterology

## 2022-10-09 ENCOUNTER — Ambulatory Visit: Payer: Medicare PPO | Admitting: Pharmacist

## 2022-10-09 ENCOUNTER — Telehealth: Payer: Self-pay

## 2022-10-09 NOTE — Telephone Encounter (Signed)
-----   Message from [name], MD sent at 10/06/2022  9:40 AM EDT ----- Inform lfts are getting better

## 2022-10-09 NOTE — Progress Notes (Signed)
Care Management & Coordination Services Pharmacy Note  10/09/2022 Name:  Sarah Payne MRN:  [id number] DOB:  09-09-1945  Summary: F/U visit -Reviewed all medications given multiple recent changes; pt affirms compliance with medications as listed in chart -HTN/CKD: PT is not checking BP at home, given recent d/c of enalapril d/t AKI, risk for uncontrolled BP is higher -DM: A1c 8.4% (07/2022); pt is not taking Lantus (only uses PRN, checks BG at bedtime and will not give if BG < 150); discussed role of Lantus vs Novolog, pt has long history of noncompliance with insulin regimen and continues to take Novolog per sliding scale with frequent hypoglycemia episodes despite recommendations to change regimen to basal/bolus -HLD/CAD: LDL 32 (08/2022) while on rosuvastatin, which was stopped 08/2022 due to elevated LFTs  Recommendations/Changes made from today's visit: -Advised to monitor BP at home; contact provider if persistently > 130/80; as kidneys recover would consider restarting enalapril for long term kidney protection -Advised pt to take Lantus daily, decision to take should be based on fasting BG not bedtime BG -Recommend to recheck lipid panel at next OV  Follow up plan: -[place] will call patient 2 months for BP update -Pharmacist follow up televisit scheduled for 3 months -ECHO/US 10/16/22; GI appt 10/16/22; PCP appt 10/30/22    Subjective: Sarah Payne is an 77 y.o. year old female who is a primary patient of [name], NP.  The care coordination team was consulted for assistance with disease management and care coordination needs.    Engaged with patient by telephone for follow up visit.  Recent office visits: 09/19/22 NP Allie Bossier OV: hospital f/u - start Lantus 10 units. D/C amitriptyline, trazodone (not taking). Reduce gabapentin to 300 mg BID.  09/02/22 NP Allie Bossier OV: dizziness - likely vertigo. Advised to call ENT. DM - Novolog 16-14-16. Continue glipizide XL  10 mg.  Referred to ED d/t AKI and dehydration.  07/31/22 NP Allie Bossier OV: f/u DM - A1c 8.4%; she stopped Lantus. She is using glipizide (not on med list); stop glipizide. Novolog 18-14-16  04/16/22 NP Allie Bossier OV: f/u - A1c 8.7%; add Lantus 8 units. Novolog 05-08-15  02/18/22 NP Allie Bossier OV: Annual - declines mammogram, colonoscopy. Referred to neurology for neuropathy. Agrees to DEXA.  01/14/22 NP Allie Bossier OV:  f/u - A1c 7.8%; d/c Lantus. Novolog 20-15-20  10/11/21 NP Allie Bossier OV: f/u depression - rx Mirtazapine 7.5 mg daily HS. Resume Lantus 10 units dialy. Novolog 16 units at lunch.   Recent consult visits: 09/24/22 Dr Vicente Males (GI): elevated LFTs - await [place] results. Full lab panel. Order EGD. Increase omeprazole to 40 mg BID.   08/28/22 PA Dunn (Cardiology): f/u - LDL 32. Ordered ECHO, ABI, Carotid US. Defer SGLT2-I or ARNI d/t hx orthostasis.  08/06/22 Dr Holley Raring (Pain mgmt): f/u - update gabapentin 600 mg BID.  02/17/22 PA Dunn (Cardiology): f/u - update gabapentin 400 mg BID. Levothyroxine 100 mcg daily.   01/30/22 Dr Holley Raring (Pain Mgmt): MSK pain - update gabapentin 600 mg BID  Hospital visits: 10/06/22 planned admission [facility]): EGD  09/11/22 ED visit [facility]): acute pancreatitis. Rx norco, zofran. Found L kidney stoney.  09/03/22 ED visit [facility]): AKI, dehydration. Gave IV fluids. Elevated LFT. Hold enalapril, rosuvastatin, glipizide. Lantus removed from list (not taking). Ordered Anoro.   Objective:  Lab Results  Component Value Date   CREATININE 1.56 (H) 09/24/2022   BUN 22 09/24/2022   GFR 26.69 (L) 09/19/2022   EGFR  35 (L) 09/24/2022   GFRNONAA 38 (L) 09/11/2022   GFRAA 55 (L) 08/30/2020   NA 137 09/24/2022   K 4.3 09/24/2022   CALCIUM 9.4 09/24/2022   CO2 21 09/24/2022   GLUCOSE 338 (H) 09/24/2022    Lab Results  Component Value Date/Time   HGBA1C 8.4 (A) 07/31/2022 11:20 AM   HGBA1C 8.7 (A) 04/16/2022 11:53 AM   HGBA1C 8.2 (H) 10/02/2021 03:11 PM   HGBA1C 8.1 (H)  07/18/2021 02:43 PM   HGBA1C 10.9 (H) 08/02/2014 04:08 AM   HGBA1C 7.3 (H) 08/19/2013 12:19 PM   GFR 26.69 (L) 09/19/2022 10:31 AM   GFR 19.51 (L) 09/02/2022 03:45 PM   MICROALBUR 24.9 (H) 07/31/2022 11:49 AM   MICROALBUR 1.2 05/30/2013 04:48 PM    Last diabetic Eye exam:  Lab Results  Component Value Date/Time   HMDIABEYEEXA No Retinopathy 10/10/2020 12:00 AM   HMDIABEYEEXA No Retinopathy 10/10/2020 12:00 AM    Last diabetic Foot exam:  Lab Results  Component Value Date/Time   HMDIABFOOTEX abnormal 05/30/2013 12:00 AM     Lab Results  Component Value Date   CHOL 117 08/28/2022   HDL 42 08/28/2022   LDLCALC 32 08/28/2022   LDLDIRECT 52 08/28/2022   TRIG 213 (H) 08/28/2022   CHOLHDL 2.8 08/28/2022       Latest Ref Rng & Units 09/24/2022    2:21 PM 09/19/2022   10:31 AM 09/11/2022    1:28 PM  Hepatic Function  Total Protein 6.0 - 8.5 g/dL 6.5  6.8  6.8   Albumin 3.8 - 4.8 g/dL 4.2  4.0  3.8   AST 0 - 40 IU/L 94  538  35   ALT 0 - 32 IU/L 235  371  61   Alk Phosphatase 44 - 121 IU/L 134  106  70   Total Bilirubin 0.0 - 1.2 mg/dL <0.2  0.4  0.7     Lab Results  Component Value Date/Time   TSH 3.46 02/18/2022 10:39 AM   TSH 1.92 01/14/2022 11:21 AM   FREET4 2.92 (H) 10/02/2021 09:28 AM   FREET4 1.34 01/04/2021 12:11 PM       Latest Ref Rng & Units 09/11/2022   11:08 AM 09/04/2022    7:30 AM 09/04/2022   12:48 AM  CBC  WBC 4.0 - 10.5 K/uL 9.1  7.3  9.4   Hemoglobin 12.0 - 15.0 g/dL 14.8  12.7  13.0   Hematocrit 36.0 - 46.0 % 44.6  38.7  39.0   Platelets 150 - 400 K/uL 271  192  215     Lab Results  Component Value Date/Time   VD25OH 41 02/25/2013 11:37 AM   VITAMINB12 542 10/13/2011 04:24 PM    Clinical ASCVD: Yes  The ASCVD Risk score (Arnett DK, et al., 2019) failed to calculate for the following reasons:   The patient has a prior MI or stroke diagnosis        09/19/2022    9:50 AM 08/06/2022    2:25 PM 08/06/2022    8:59 AM  Depression screen PHQ 2/9   Decreased Interest 3 0 0  Down, Depressed, Hopeless 3 0 2  PHQ - 2 Score 6 0 2  Altered sleeping 0  0  Tired, decreased energy 3  2  Change in appetite 3  0  Feeling bad or failure about yourself  0  2  Trouble concentrating 3  1  Moving slowly or fidgety/restless 0  0  Suicidal thoughts  1  0  PHQ-9 Score 16  7  Difficult doing work/chores Very difficult  Not difficult at all     Social History   Tobacco Use  Smoking Status Every Day   Packs/day: 1.00   Years: 45.00   Additional pack years: 0.00   Total pack years: 45.00   Types: Cigarettes  Smokeless Tobacco Never  Tobacco Comments   Has cut back, trying to quit.    BP Readings from Last 3 Encounters:  10/06/22 111/69  09/24/22 (!) 156/95  09/19/22 116/72   Pulse Readings from Last 3 Encounters:  10/06/22 73  09/24/22 89  09/19/22 72   Wt Readings from Last 3 Encounters:  10/06/22 133 lb 4.3 oz (60.5 kg)  09/24/22 130 lb 9.6 oz (59.2 kg)  09/19/22 133 lb (60.3 kg)   BMI Readings from Last 3 Encounters:  10/06/22 21.51 kg/m  09/24/22 21.08 kg/m  09/19/22 21.47 kg/m    No Known Allergies  Medications Reviewed Today     Reviewed by Frederich Balding, RN (Registered Nurse) on 10/06/22 at 36  Med List Status: <None>   Medication Order Taking? Sig Documenting Provider Last Dose Status Informant  albuterol (VENTOLIN HFA) 108 (90 Base) MCG/ACT inhaler [id number] No Inhale 2 puffs into the lungs every 6 (six) hours as needed for wheezing or shortness of breath. [name], NP prn Active Self  carvedilol (COREG) 3.125 MG tablet [id number] Yes Take 1 tablet (3.125 mg total) by mouth 2 (two) times daily. Rise Mu, PA-C 10/05/2022 Active Self  clobetasol cream (TEMOVATE) 0.05 % [id number] Yes Apply 1 Application topically 2 (two) times daily as needed. For vaginal itching. [name], NP 10/05/2022 Active Self  clopidogrel (PLAVIX) 75 MG tablet [id number] No Take 1 tablet (75 mg total) by mouth daily.  Rise Mu, PA-C 10/01/2022 Active Self  Continuous Blood Gluc Receiver (FREESTYLE LIBRE 2 READER) DEVI [id number] Yes Use with sensor to check blood sugars 6 times daily. [name], NP 10/06/2022 Active Self  ezetimibe (ZETIA) 10 MG tablet [id number] Yes Take 1 tablet (10 mg total) by mouth daily. For cholesterol. [name] D, PA-C 10/05/2022 Active Self  famotidine (PEPCID) 20 MG tablet [id number] Yes Take 1 tablet (20 mg total) by mouth at bedtime. For nausea and acid [name], NP 10/05/2022 Active   furosemide (LASIX) 20 MG tablet [id number] Yes Take 1 tablet (20 mg total) by mouth as needed (As needed for weight gain greater than 3 pounds overnight or increased shortness of breath.). Take one tab only on Monday, Wednesday, & Fridays Rise Mu, Hershal Coria 10/05/2022 Active Self  gabapentin (NEURONTIN) 300 MG capsule [id number] Yes Take 1 capsule (300 mg total) by mouth 2 (two) times daily. For pain [name], NP 10/05/2022 Active   glucose blood (FREESTYLE LITE) test strip [id number] Yes Test up to four times a day as needed. DX [name], NP 10/05/2022 Active Self  HYDROcodone-acetaminophen (NORCO/VICODIN) 5-325 MG tablet [id number] Yes Take 1 tablet by mouth every 4 (four) hours as needed. Harvest Dark, MD 10/05/2022 Active   insulin aspart (NOVOLOG FLEXPEN) 100 UNIT/ML FlexPen [id number] Yes INJECT 10 UNITS INTO THE SKIN WITH BREAKFAST, 18 UNITS WITH LUNCH, AND 16 UNITS WITH DINNER FOR DIABETES [name], NP 10/06/2022 0700 Active   insulin glargine (LANTUS SOLOSTAR) 100 UNIT/ML Solostar Pen [id number] No Inject 10 Units into the skin daily. for diabetes.  Patient not taking: Reported on 10/06/2022   Alma Friendly  K, NP Not Taking Active   Insulin Pen Needle (INSUPEN PEN NEEDLES) 32G X 4 MM MISC [id number] Yes Use to inject insulin 4 times daily. [name], NP 10/06/2022 Active Self  levothyroxine (SYNTHROID) 100 MCG tablet [id number] Yes Take 100  mcg by mouth daily before breakfast. [provider] 10/05/2022 Active Self  mirtazapine (REMERON) 7.5 MG tablet [id number] Yes Take 1 tablet (7.5 mg total) by mouth at bedtime. For appetite and sleep. [name], NP 10/05/2022 Active Self  omeprazole (PRILOSEC) 40 MG capsule [id number] Yes Take 1 capsule (40 mg total) by mouth daily. [name], MD 10/05/2022 Active   ondansetron (ZOFRAN) 4 MG tablet [id number] No Take 1 tablet (4 mg total) by mouth every 6 (six) hours as needed for nausea. Lattie Corns, PA-C prn Active Self  ondansetron (ZOFRAN-ODT) 4 MG disintegrating tablet [id number]  Take 1 tablet (4 mg total) by mouth every 8 (eight) hours as needed for nausea or vomiting. Harvest Dark, MD  Active   potassium chloride (KLOR-CON M) 10 MEQ tablet [id number] Yes Only take on days you take your lasix. Take 1 tablet (10 mEq total) by mouth for 1 dose on those days you take your lasix 20mg  pill. Rise Mu, PA-C 10/05/2022 Active Self  umeclidinium-vilanterol (ANORO ELLIPTA) 62.5-25 MCG/ACT AEPB [id number] No Inhale 1 puff into the lungs daily.  Patient not taking: Reported on 10/06/2022   Loletha Grayer, MD Not Taking Active   Med List Note Dewayne Shorter, RN 07/09/21 1059): 02-11-18 Message sent to Dr. Rockey Situ in Epic for permission to stop Plavix 7 days. DW Mr 01-05-2022            SDOH:  (Social Determinants of Health) assessments and interventions performed: No SDOH Interventions    Flowsheet Row Office Visit from 09/19/2022 in [place] at [place] from 08/06/2022 in [place] at [facility] Visit from 07/31/2022 in [place] at [place] Visit from 02/18/2022 in [place] at [place] Visit from 01/14/2022 in [place] at [facility] from 09/05/2021 in [place] at [place] Interventions -- Intervention Not Indicated -- -- -- --  Housing Interventions -- Intervention Not Indicated -- -- -- --  Transportation Interventions -- Intervention Not Indicated -- -- -- --  Utilities Interventions -- Intervention Not Indicated -- -- -- --  Alcohol Usage Interventions -- Intervention Not Indicated (Score <7) -- -- -- --  Depression Interventions/Treatment  Counseling Medication, Counseling Medication Counseling Counseling Medication  [Pt declined pyschiatry or counseling referral, reports "talking won't help"]  Financial Strain Interventions -- Intervention Not Indicated -- -- -- --  Physical Activity Interventions -- Patient Refused -- -- -- --  Stress Interventions -- Intervention Not Indicated -- -- -- --  Social Connections Interventions -- Intervention Not Indicated -- -- -- --       Medication Assistance: None required.  Patient affirms current coverage meets needs.  Medication Access: Within the past 30 days, how often has patient missed a dose of medication? 0 Is a pillbox or other method used to improve adherence? Yes  Factors that may affect medication adherence? nonadherence to medications and lack of understanding of disease management Are meds synced by current pharmacy? No  Are meds delivered by current pharmacy? Yes  Does patient experience delays  in picking up medications due to transportation concerns? No   [facility] Services Reviewed: Is patient disadvantaged to use [facility]?: Yes  Current Rx insurance plan: [employer] Name and location of Current pharmacy:  [facility] [address], [place] - [place] [address] Phone: [phone] Fax: [place], [place] [address] Phone: [phone] Fax: [phone]  [facility] [place], [place] [address] [facility] [address] [place] [address] Phone: [phone] Fax: [phone]  [facility] services reviewed with patient today?: No  Patient requests to transfer care to [facility]?: No  Reason patient declined to change pharmacies: Disadvantaged due to insurance/mail order  Compliance/Adherence/Medication fill history: Care Gaps: Eye exam (due 09/2021)  Star-Rating Drugs: None   Assessment/Plan  Hypertension / Heart Failure (BP goal <130/80) -Query controlled - enalapril was discontinued recently in s/o AKI, pt has not been checking BP at home -Current home BP readings: none available -Current home weights: none available -Last EF 55-60% (09/2021), previously 25-30% in 2017 -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%) -NYHA Class: I-II; AHA HF Stage: C (Heart disease and symptoms present) -Hx orthostasis -Current treatment: Carvedilol 3.125mg  BID - Appropriate, Effective, Safe, Accessible Furosemide 20 mg 3x weekly - Appropriate, Effective, Safe, Accessible -Medications previously tried: enalapril, spironolactone (AKI),  -Educated on Importance of blood pressure control -Counseled to monitor BP at home daily; contact provider if consistently > 130/80 -Recommended to continue current medication  Hyperlipidemia / CAD (LDL goal < 70) -Query Controlled - LDL 32 (08/2022) while on rosuvastatin, which was stopped 08/2022 due to elevated LFTs -Hx NSTEMI 2016; Hx PAD -Current treatment: Ezetimibe 10 mg daily - Appropriate, Effective, Safe, Accessible Clopidogrel 75 mg daily - Appropriate, Effective, Safe, Accessible -Medications previously tried: rosuvastatin (elevated LFT) -Recommended to continue current medication; recheck lipids at next OV  Diabetes (A1c goal <7%) -Uncontrolled, improving - A1c 8.4 (07/2022) improved from >10% previosly; pt is not taking Lantus (only uses PRN, checks BG at bedtime and will not give if BG < 150); discussed role of Lantus vs  Novolog, pt has long history of noncompliance with insulin regimen and continues to take Novolog per sliding scale with frequent hypoglycemia episodes despite recommendations to change regimen to basal/bolus -Current home BG readings: CGM -Fasting BG 120-170 -Current medications: Novolog 05-04-15 TID w/ meals - Query appropriate (as monotherapy) Lantus Solostar 10 units daily - not taking (PRN) Freestyle Libre 2 (reader) -Medications previously tried: glipizide, Januvia, metformin (diarrhea), Trulicity (cost) -Would avoid GLP-1 RA given appetite/wt loss issues; SGLT2-I deferred by cardiology d/t hx orthostasis -Advised pt to take Lantus daily, decision to take should be based on fasting BG not bedtime BG  Depression/Anxiety (Goal: manage symptoms) -Stable - pt reports feeling better lately -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 for past ~4 years after his heart attack; he can walk but is mostly bed/chair-bound; she reports significant depressed mood, hopelessness, poor appetite, poor sleep; her 2 children do occasionally help which she is grateful for; she has been unable to use home health nurses/aids because her husband has PTSD and is unable to tolerate strangers in his home -She typically goes to bed at 10-11 pm, and wakes at 3-4 AM -PHQ9: 16 (09/2022) - moderately severe depression -Connected with PCP for mental health support -Current treatment: Mirtazapine 7.5 mg daily HS - Appropriate, Effective, Safe, Accessible -Medications previously tried/failed:  duloxetine, escitalopram, trazodone, amitriptyline -Previously discussed caregiver stress/burnout, her situation is complicated by her husband's PTSD so only family/friends can help her -Recommend to continue current medication  COPD (Goal: control symptoms and prevent exacerbations) -Controlled - pt reports improvement since starting Anoro; she has not needed rescue inhaler in months -Gold Grade: Gold 2 (FEV1 50-79%) -Current COPD  Classification:  A (low sx, 0-1 moderate exacerbations, no hospitalizations) -Pulmonary function testing: 03/29/2013 simple spirometry- ratio 65%; FEV1 1.54L (60% pred)  -Exacerbations requiring treatment in last 6 months: 0 -Current treatment  Anoro Ellipta 1 puff daily - Appropriate, Effective, Safe, Accessible Albuterol HFA prn - not using -Medications previously tried: n/a  -Patient reports consistent use of maintenance inhaler -Frequency of rescue inhaler use: rare -Counseled on Benefits of consistent maintenance inhaler use -Recommended to continue current medication  Neuropathy (Goal: manage symptoms) -Controlled - pt reports dizziness is improved with lower dose of gabapentin -Current treatment  Gabapentin 300 mg BID - Appropriate, Effective, Safe, Accessible -Medications previously tried: n/a  -Recommended to continue current medication  GERD (Goal: minimize symptoms of reflux ) -Controlled - follows with GI; undergoing workup for elevated LFTs, which are improving;  -Current treatment  Omeprazole 40 mg BID - Appropriate, Effective, Safe, Accessible Famotidine 20 mg PRN -Appropriate, Effective, Safe, Accessible Ondansetron 4 mg -Appropriate, Effective, Safe, Accessible -Medications previously tried: n/a -Counseled on small meals, elevating head, and sleeping on left side -Recommended to continue current medication  Chronic Kidney Disease Stage 3b  -All medications assessed for renal dosing and appropriateness in chronic kidney disease. 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

## 2022-10-09 NOTE — Telephone Encounter (Signed)
Called patient but had to leave her a detailed message getting her know that her lfts are getting better per Dr. Vicente Males. I let her know that if she had further questions, to call us back.

## 2022-10-09 NOTE — Telephone Encounter (Signed)
Patiet called me back and I was able to give her the news and she was happy to hear. Patient also stated that she would want a prescription for Ondansetron for her nausea. Please advise if I could send her a script for how many and if refills. Thank you.

## 2022-10-10 ENCOUNTER — Other Ambulatory Visit: Payer: Self-pay | Admitting: Primary Care

## 2022-10-10 DIAGNOSIS — Z1231 Encounter for screening mammogram for malignant neoplasm of breast: Secondary | ICD-10-CM

## 2022-10-10 MED ORDER — ONDANSETRON HCL 4 MG PO TABS: 4.0000 mg | ORAL_TABLET | Freq: Four times a day (QID) | ORAL | 0 refills | Status: DC | PRN

## 2022-10-10 NOTE — Telephone Encounter (Signed)
Patient was informed that her prescription was sent to her pharmacy.

## 2022-10-10 NOTE — Patient Instructions (Signed)
Visit Information  Phone number for Pharmacist: [phone]  Thank you for meeting with me to discuss your medications! Below is a summary of what we talked about during the visit:   Recommendations/Changes made from today's visit: -Advised to monitor BP at home; contact provider if persistently > 130/80; as kidneys recover would consider restarting enalapril for long term kidney protection -Advised pt to take Lantus daily, decision to take should be based on fasting BG not bedtime BG -Recommend to recheck lipid panel at next OV  Follow up plan: -[place] will call patient 2 months for BP update -Pharmacist follow up televisit scheduled for 3 months -ECHO/US 10/16/22; GI appt 10/16/22; PCP appt 10/30/22   Charlene Brooke, PharmD, BCACP Clinical Pharmacist [facility] at [facility] [phone]

## 2022-10-10 NOTE — Addendum Note (Signed)
Addended by: Wayna Chalet on: 10/10/2022 11:38 AM   Modules accepted: Orders

## 2022-10-16 ENCOUNTER — Ambulatory Visit: Payer: Medicare PPO | Attending: Physician Assistant

## 2022-10-16 ENCOUNTER — Encounter: Payer: Self-pay | Admitting: Gastroenterology

## 2022-10-16 ENCOUNTER — Ambulatory Visit: Payer: Medicare PPO | Admitting: Gastroenterology

## 2022-10-16 ENCOUNTER — Ambulatory Visit (INDEPENDENT_AMBULATORY_CARE_PROVIDER_SITE_OTHER): Payer: Medicare PPO

## 2022-10-16 DIAGNOSIS — I5181 Takotsubo syndrome: Secondary | ICD-10-CM

## 2022-10-16 DIAGNOSIS — I5022 Chronic systolic (congestive) heart failure: Secondary | ICD-10-CM

## 2022-10-16 DIAGNOSIS — I779 Disorder of arteries and arterioles, unspecified: Secondary | ICD-10-CM | POA: Diagnosis not present

## 2022-10-16 DIAGNOSIS — I6523 Occlusion and stenosis of bilateral carotid arteries: Secondary | ICD-10-CM

## 2022-10-16 DIAGNOSIS — I739 Peripheral vascular disease, unspecified: Secondary | ICD-10-CM

## 2022-10-16 LAB — ECHOCARDIOGRAM COMPLETE
AR max vel: 2.89 cm2
AV Area VTI: 3.05 cm2
AV Area mean vel: 2.77 cm2
AV Mean grad: 2 mmHg
AV Peak grad: 3.3 mmHg
Ao pk vel: 0.91 m/s
Area-P 1/2: 2.76 cm2
S' Lateral: 2.9 cm

## 2022-10-16 NOTE — Progress Notes (Deleted)
[name] MD, MRCP(U.K) [address]  [place], [place] [address]  Main: [phone]  Fax: [phone]   Primary Care Physician: [name], NP  Primary Gastroenterologist:  Dr. [name]   No chief complaint on file.   HPI: Sarah Payne is a 77 y.o. female Summary of history : She was last seen at my office back in May 2020 for dysphagia.  I have dilated her empirically to 66 [place] and ruled out eosinophilic esophagitis.  At that point of time she was doing well she had poor dentition and was supposed to get false teeth.   Initially referred and seen in March 2024 for abnormal LFTs   09/19/2022: LFTs AST 538 ALT 371 gradual rise over the past 3 weeks.  GGT is also elevated at 56.  Hepatitis B surface antigen and hepatitis C virus antibody negative. Echo in 2023 showed no elevated pulmonary artery systolic pressure.  Had gained weight recently denies any alcohol use.  No new medications except gabapentin  She also saw me for unintentional weight loss. Interval history-09/24/2022-10/16/2022   09/24/2022: LFTs were improving alkaline phosphatase 134 AST 94 ALT 235, iron studies celiac serology, autoimmune profile and testing for viral hepatitis were negative.  Hepatitis A total antibody positive. 10/01/2022: Ultrasound abdomen shows no acute abnormality increase echotexture of the right kidney suggestive of medical renal disease. 10/06/2022: EGD:  Current Outpatient Medications  Medication Sig Dispense Refill   albuterol (VENTOLIN HFA) 108 (90 Base) MCG/ACT inhaler Inhale 2 puffs into the lungs every 6 (six) hours as needed for wheezing or shortness of breath. 8 g 0   carvedilol (COREG) 3.125 MG tablet Take 1 tablet (3.125 mg total) by mouth 2 (two) times daily. 180 tablet 3   clobetasol cream (TEMOVATE) [id number] % Apply 1 Application topically 2 (two) times daily as needed. For vaginal itching. 30 g 0   clopidogrel (PLAVIX) 75 MG tablet Take 1 tablet (75 mg  total) by mouth daily. 90 tablet 3   Continuous Blood Gluc Receiver (FREESTYLE LIBRE 2 READER) DEVI Use with sensor to check blood sugars 6 times daily. 1 each 0   ezetimibe (ZETIA) 10 MG tablet Take 1 tablet (10 mg total) by mouth daily. For cholesterol. 90 tablet 3   famotidine (PEPCID) 20 MG tablet Take 1 tablet (20 mg total) by mouth at bedtime. For nausea and acid 90 tablet 0   furosemide (LASIX) 20 MG tablet Take 1 tablet (20 mg total) by mouth as needed (As needed for weight gain greater than 3 pounds overnight or increased shortness of breath.). Take one tab only on Monday, Wednesday, & Fridays 90 tablet 3   gabapentin (NEURONTIN) 300 MG capsule Take 1 capsule (300 mg total) by mouth 2 (two) times daily. For pain 180 capsule 0   glucose blood (FREESTYLE LITE) test strip Test up to four times a day as needed. DX 100 each 0   HYDROcodone-acetaminophen (NORCO/VICODIN) 5-325 MG tablet Take 1 tablet by mouth every 4 (four) hours as needed. 15 tablet 0   insulin aspart (NOVOLOG FLEXPEN) 100 UNIT/ML FlexPen INJECT 10 UNITS INTO THE SKIN WITH BREAKFAST, 18 UNITS WITH LUNCH, AND 16 UNITS WITH DINNER FOR DIABETES 45 mL 0   insulin glargine (LANTUS SOLOSTAR) 100 UNIT/ML Solostar Pen Inject 10 Units into the skin daily. for diabetes. (Patient not taking: Reported on 10/10/2022) 15 mL 2   Insulin Pen Needle (INSUPEN PEN NEEDLES) 32G X 4 MM MISC Use to  inject insulin 4 times daily. 250 each 1   levothyroxine (SYNTHROID) 100 MCG tablet Take 100 mcg by mouth daily before breakfast.     mirtazapine (REMERON) 7.5 MG tablet Take 1 tablet (7.5 mg total) by mouth at bedtime. For appetite and sleep. 90 tablet 0   omeprazole (PRILOSEC) 40 MG capsule Take 1 capsule (40 mg total) by mouth daily. 90 capsule 3   ondansetron (ZOFRAN) 4 MG tablet Take 1 tablet (4 mg total) by mouth every 6 (six) hours as needed for nausea. 30 tablet 0   ondansetron (ZOFRAN-ODT) 4 MG disintegrating tablet Take 1 tablet (4 mg total) by mouth  every 8 (eight) hours as needed for nausea or vomiting. 20 tablet 0   potassium chloride (KLOR-CON M) 10 MEQ tablet Only take on days you take your lasix. Take 1 tablet (10 mEq total) by mouth for 1 dose on those days you take your lasix 20mg  pill. 90 tablet 3   umeclidinium-vilanterol (ANORO ELLIPTA) 62.5-25 MCG/ACT AEPB Inhale 1 puff into the lungs daily. 60 each 0   No current facility-administered medications for this visit.    Allergies as of 10/16/2022   (No Known Allergies)    ROS:  General: Negative for anorexia, weight loss, fever, chills, fatigue, weakness. ENT: Negative for hoarseness, difficulty swallowing , nasal congestion. CV: Negative for chest pain, angina, palpitations, dyspnea on exertion, peripheral edema.  Respiratory: Negative for dyspnea at rest, dyspnea on exertion, cough, sputum, wheezing.  GI: See history of present illness. GU:  Negative for dysuria, hematuria, urinary incontinence, urinary frequency, nocturnal urination.  Endo: Negative for unusual weight change.    Physical Examination:   There were no vitals taken for this visit.  General: Well-nourished, well-developed in no acute distress.  Eyes: No icterus. Conjunctivae pink. Mouth: Oropharyngeal mucosa moist and pink , no lesions erythema or exudate. Lungs: Clear to auscultation bilaterally. Non-labored. Heart: Regular rate and rhythm, no murmurs rubs or gallops.  Abdomen: Bowel sounds are normal, nontender, nondistended, no hepatosplenomegaly or masses, no abdominal bruits or hernia , no rebound or guarding.   Extremities: No lower extremity edema. No clubbing or deformities. Neuro: Alert and oriented x 3.  Grossly intact. Skin: Warm and dry, no jaundice.   Psych: Alert and cooperative, normal mood and affect.   Imaging Studies: US ABDOMEN LIMITED RUQ (LIVER/GB)  Result Date: 10/01/2022 CLINICAL DATA:  Abnormal liver function tests. EXAM: ULTRASOUND ABDOMEN LIMITED RIGHT UPPER QUADRANT  COMPARISON:  CT abdomen pelvis September 11, 2022 FINDINGS: Gallbladder: No gallstones or wall thickening visualized. No sonographic Murphy sign noted by sonographer. Common bile duct: Diameter: 3.1 mm Liver: No focal lesion identified. Within normal limits in parenchymal echogenicity. Portal vein is patent on color Doppler imaging with normal direction of blood flow towards the liver. Other: Incidental finding of diffuse increased echotexture of the right kidney with cortical thinning raising the question of medical renal disease. IMPRESSION: 1. No acute abnormality identified. 2. Incidental finding of diffuse increased echotexture of the right kidney with cortical thinning raising the question of medical renal disease. Electronically Signed   By: Abelardo Diesel M.D.   On: 10/01/2022 09:09    Assessment and Plan:   Sarah Payne is a 77 y.o. y/o female here to follow up for abnormal LFTs.  In addition has lost over 16 pounds of weight recently CT chest abdomen pelvis shows no abnormality.   Plan 1.  Abnormal weight loss suggested EGD and colonoscopy she is not keen on a  colonoscopy but is willing to proceed with an EGD 4.  For her regurgitation commence on Prilosec 40 mg twice daily new prescription will be provided 5.  If regurgitation persists next visit and no abnormality seen will proceed with HIDA scan.  Will try to reiterate the importance of a colonoscopy for weight loss at next visit    Dr [name]  MD,MRCP [facility]) Follow up in ***  BP check ***

## 2022-10-17 LAB — VAS US ABI WITH/WO TBI
Left ABI: 1.13
Right ABI: 1.09

## 2022-10-22 ENCOUNTER — Other Ambulatory Visit: Payer: Self-pay | Admitting: Primary Care

## 2022-10-22 DIAGNOSIS — E1151 Type 2 diabetes mellitus with diabetic peripheral angiopathy without gangrene: Secondary | ICD-10-CM

## 2022-10-24 ENCOUNTER — Telehealth: Payer: Self-pay | Admitting: *Deleted

## 2022-10-24 NOTE — Progress Notes (Signed)
  Care Coordination   Note   10/24/2022 Name: Sarah Payne MRN: [id number] DOB: 1945-12-02  DEYNA MARBUT is a 77 y.o. year old female who sees Doreene Nest, NP for primary care. I reached out to [name] by phone today to offer care coordination services.  Ms. Debruler was given information about Care Coordination services today including:   The Care Coordination services include support from the care team which includes your Nurse Coordinator, Clinical Social Worker, or Pharmacist.  The Care Coordination team is here to help remove barriers to the health concerns and goals most important to you. Care Coordination services are voluntary, and the patient may decline or stop services at any time by request to their care team member.   Care Coordination Consent Status: Patient agreed to services and verbal consent obtained.   Follow up plan:  Telephone appointment with care coordination team member scheduled for:  11/04/2022  Encounter Outcome:  Pt. Scheduled  Burman Nieves, CCMA Care Coordination Care Guide Direct Dial: [phone]

## 2022-10-28 ENCOUNTER — Ambulatory Visit (INDEPENDENT_AMBULATORY_CARE_PROVIDER_SITE_OTHER): Payer: Medicare PPO | Admitting: Family Medicine

## 2022-10-28 ENCOUNTER — Encounter: Payer: Self-pay | Admitting: Family Medicine

## 2022-10-28 VITALS — BP 150/82 | HR 75 | Temp 97.7°F | Ht 66.0 in | Wt 132.2 lb

## 2022-10-28 DIAGNOSIS — R7401 Elevation of levels of liver transaminase levels: Secondary | ICD-10-CM

## 2022-10-28 DIAGNOSIS — E782 Mixed hyperlipidemia: Secondary | ICD-10-CM | POA: Diagnosis not present

## 2022-10-28 DIAGNOSIS — R109 Unspecified abdominal pain: Secondary | ICD-10-CM | POA: Diagnosis not present

## 2022-10-28 DIAGNOSIS — K859 Acute pancreatitis without necrosis or infection, unspecified: Secondary | ICD-10-CM | POA: Diagnosis not present

## 2022-10-28 DIAGNOSIS — R350 Frequency of micturition: Secondary | ICD-10-CM

## 2022-10-28 DIAGNOSIS — N1832 Chronic kidney disease, stage 3b: Secondary | ICD-10-CM | POA: Diagnosis not present

## 2022-10-28 HISTORY — DX: Unspecified abdominal pain: R10.9

## 2022-10-28 LAB — CBC WITH DIFFERENTIAL/PLATELET
Basophils Absolute: 0 10*3/uL (ref 0.0–0.1)
Basophils Relative: 0.2 % (ref 0.0–3.0)
Eosinophils Absolute: 0.1 10*3/uL (ref 0.0–0.7)
Eosinophils Relative: 1.5 % (ref 0.0–5.0)
HCT: 40.8 % (ref 36.0–46.0)
Hemoglobin: 13.8 g/dL (ref 12.0–15.0)
Lymphocytes Relative: 31 % (ref 12.0–46.0)
Lymphs Abs: 2.1 10*3/uL (ref 0.7–4.0)
MCHC: 33.7 g/dL (ref 30.0–36.0)
MCV: 98.9 fl (ref 78.0–100.0)
Monocytes Absolute: 0.4 10*3/uL (ref 0.1–1.0)
Monocytes Relative: 6.3 % (ref 3.0–12.0)
Neutro Abs: 4.1 10*3/uL (ref 1.4–7.7)
Neutrophils Relative %: 61 % (ref 43.0–77.0)
Platelets: 240 10*3/uL (ref 150.0–400.0)
RBC: 4.12 Mil/uL (ref 3.87–5.11)
RDW: 13.6 % (ref 11.5–15.5)
WBC: 6.8 10*3/uL (ref 4.0–10.5)

## 2022-10-28 LAB — POCT UA - MICROSCOPIC ONLY
Bacteria, U Microscopic: 0
RBC, Urine, Miroscopic: 0 (ref 0–2)

## 2022-10-28 LAB — BASIC METABOLIC PANEL
BUN: 19 mg/dL (ref 6–23)
CO2: 29 mEq/L (ref 19–32)
Calcium: 9.3 mg/dL (ref 8.4–10.5)
Chloride: 101 mEq/L (ref 96–112)
Creatinine, Ser: 1.25 mg/dL — ABNORMAL HIGH (ref 0.40–1.20)
GFR: 42.42 mL/min — ABNORMAL LOW (ref >60.00)
Glucose, Bld: 148 mg/dL — ABNORMAL HIGH (ref 70–99)
Potassium: 3.4 mEq/L — ABNORMAL LOW (ref 3.5–5.1)
Sodium: 137 mEq/L (ref 135–145)

## 2022-10-28 LAB — HEPATIC FUNCTION PANEL
ALT: 9 U/L (ref 0–35)
AST: 14 U/L (ref 0–37)
Albumin: 4.2 g/dL (ref 3.5–5.2)
Alkaline Phosphatase: 86 U/L (ref 39–117)
Bilirubin, Direct: 0.1 mg/dL (ref 0.0–0.3)
Total Bilirubin: 0.4 mg/dL (ref 0.2–1.2)
Total Protein: 6.7 g/dL (ref 6.0–8.3)

## 2022-10-28 LAB — POC URINALSYSI DIPSTICK (AUTOMATED)
Bilirubin, UA: NEGATIVE
Blood, UA: NEGATIVE
Glucose, UA: NEGATIVE
Ketones, UA: NEGATIVE
Leukocytes, UA: NEGATIVE
Nitrite, UA: NEGATIVE
Protein, UA: NEGATIVE
Spec Grav, UA: 1.01 (ref 1.010–1.025)
Urobilinogen, UA: 0.2 E.U./dL
pH, UA: 6 (ref 5.0–8.0)

## 2022-10-28 LAB — LIPASE: Lipase: 28 U/L (ref 11.0–59.0)

## 2022-10-28 MED ORDER — ONDANSETRON HCL 4 MG PO TABS: 4.0000 mg | ORAL_TABLET | Freq: Four times a day (QID) | ORAL | 0 refills | Status: DC | PRN

## 2022-10-28 NOTE — Progress Notes (Signed)
Subjective:    Patient ID: Sarah Payne, female    DOB: 1946-02-02, 77 y.o.   MRN: [id number]  HPI 77 yo pt of NP Clark presents with urinary symptoms and L flank pain  She is a smoker with copd and chronic pain syndrome and DM2 Also h/o CKD stage 3b Pancreatitis and elevated lfts  Wt Readings from Last 3 Encounters:  10/28/22 132 lb 4 oz (60 kg)  10/06/22 133 lb 4.3 oz (60.5 kg)  09/24/22 130 lb 9.6 oz (59.2 kg)   21.35 kg/m   Left low back - different than usual for past few days  Hurts/ sore pain  Also burning sensation  No rash  Not positional    Frequent urination  Sometimes leaks  No blood in urine  No h/o kidney stones   H/o chronic back pain  Goes to pain clinic  H/o lumbar fusion    Takes omeprazole Alos pepcid  Was on zofran   Took a hydrocodone yesterday from pain man  She had one left     Has been in the hospital recently  Lost weight  A lot of vomiting and light headedness  Has been really sick and cannot eat well  No diagnosis yet   Most recent CT of chest/abd/pelvis  CT CHEST ABDOMEN PELVIS WO CONTRAST (Accession [id number]) (Order [id number]) Imaging Date: 09/03/2022 Department: [facility] at [facility] Released By: Garry Heater, RT Authorizing: Pilar Jarvis, MD   Exam Status  Status  Final [99]   PACS Intelerad Image Link   Show images for CT CHEST ABDOMEN PELVIS WO CONTRAST Study Result  Narrative & Impression  CLINICAL DATA:  Flank pain. Acute kidney injury. To rule out obstructive. Dizziness and nausea since last week. Back pain and shortness of breath.   EXAM: CT CHEST, ABDOMEN AND PELVIS WITHOUT CONTRAST   TECHNIQUE: Multidetector CT imaging of the chest, abdomen and pelvis was performed following the standard protocol without IV contrast.   RADIATION DOSE REDUCTION: This exam was performed according to the departmental dose-optimization program which includes automated exposure  control, adjustment of the mA and/or kV according to patient size and/or use of iterative reconstruction technique.   COMPARISON:  CT chest 05/13/2021.  CT abdomen and pelvis 02/06/2016   FINDINGS: CT CHEST FINDINGS   Cardiovascular: Normal heart size. No pericardial effusions. Normal caliber thoracic aorta. Calcification in the aorta and coronary arteries.   Mediastinum/Nodes: Esophagus is decompressed. No significant lymphadenopathy. Thyroid gland is unremarkable.   Lungs/Pleura: Lungs are clear. No pleural effusions. No pneumothorax.   Musculoskeletal: Postoperative left shoulder replacement. Degenerative changes in the spine.   CT ABDOMEN PELVIS FINDINGS   Hepatobiliary: No focal liver abnormality is seen. No gallstones, gallbladder wall thickening, or biliary dilatation.   Pancreas: Unremarkable. No pancreatic ductal dilatation or surrounding inflammatory changes.   Spleen: Normal in size without focal abnormality.   Adrenals/Urinary Tract: No adrenal gland nodules. Left renal cysts measuring up to 2.8 cm diameter, unchanged since prior study. No imaging follow-up is indicated. No hydronephrosis or hydroureter. Central renal calcifications appear to be vascular calcification. No renal or ureteral stone identified. Bladder is normal.   Stomach/Bowel: Stomach is mildly distended and filled with ingested material, likely physiologic. No gastric wall thickening is appreciated. Small duodenal diverticulum. Small bowel and colon are not abnormally distended. No wall thickening or inflammatory changes are appreciated. Colonic diverticulosis without evidence of acute diverticulitis. Appendix is not identified.   Vascular/Lymphatic: Aortic atherosclerosis. No  enlarged abdominal or pelvic lymph nodes.   Reproductive: Status post hysterectomy. No adnexal masses.   Other: No abdominal wall hernia or abnormality. No abdominopelvic ascites.   Musculoskeletal: Postoperative  lumbar fixation from L3 to the sacrum. Degenerative changes in the spine and hips.   IMPRESSION: 1. No acute process demonstrated in the chest, abdomen, or pelvis. 2. Aortic atherosclerosis  Recent US abd .US ABDOMEN LIMITED RUQ (LIVER/GB) (Accession [id number]) (Order [id number]) Imaging Date: 10/01/2022 [facility] Released By: Landry MellowIsley, Kimberly D Authorizing: Wyline MoodAnna, Kiran, MD   Exam Status  Status  Final [99]   PACS Intelerad Image Link   Show images for US ABDOMEN LIMITED RUQ (LIVER/GB) Study Result  Narrative & Impression  CLINICAL DATA:  Abnormal liver function tests.   EXAM: ULTRASOUND ABDOMEN LIMITED RIGHT UPPER QUADRANT   COMPARISON:  CT abdomen pelvis September 11, 2022   FINDINGS: Gallbladder:   No gallstones or wall thickening visualized. No sonographic Murphy sign noted by sonographer.   Common bile duct:   Diameter: 3.1 mm   Liver:   No focal lesion identified. Within normal limits in parenchymal echogenicity. Portal vein is patent on color Doppler imaging with normal direction of blood flow towards the liver.   Other: Incidental finding of diffuse increased echotexture of the right kidney with cortical thinning raising the question of medical renal disease.   IMPRESSION: 1. No acute abnormality identified. 2. Incidental finding of diffuse increased echotexture of the right kidney with cortical thinning raising the question of medical renal disease.     Entirely nl EGD in late march with Dr Candy SledgeKiran     Sees [name] on 4/11   Results for orders placed or performed in visit on 10/28/22  POCT Urinalysis Dipstick (Automated)  Result Value Ref Range   Color, UA Yellow    Clarity, UA Clear    Glucose, UA Negative Negative   Bilirubin, UA Negative    Ketones, UA Negative    Spec Grav, UA 1.010 1.010 - 1.025   Blood, UA Negative    pH, UA 6.0 5.0 - 8.0   Protein, UA Negative Negative    Urobilinogen, UA 0.2 0.2 or 1.0 E.U./dL   Nitrite, UA Negative    Leukocytes, UA Negative Negative  POCT UA - Microscopic Only  Result Value Ref Range   WBC, Ur, HPF, POC 0-1 0 - 5   RBC, Urine, Miroscopic 0 0 - 2   Bacteria, U Microscopic 0 None - Trace   Mucus, UA few    Epithelial cells, urine per micros few    Crystals, Ur, HPF, POC none    Casts, Ur, LPF, POC none    Yeast, UA none        Lab Results  Component Value Date   CREATININE 1.56 (H) 09/24/2022   BUN 22 09/24/2022   NA 137 09/24/2022   K 4.3 09/24/2022   CL 99 09/24/2022   CO2 21 09/24/2022   GFR 38   Per chart  H/o pancreatitis ?  Lab Results  Component Value Date   ALT 235 (H) 09/24/2022   AST 94 (H) 09/24/2022   ALKPHOS 134 (H) 09/24/2022   BILITOT <0.2 09/24/2022   Lab Results  Component Value Date   LIPASE 75.0 (H) 09/19/2022   Patient Active Problem List   Diagnosis Date Noted   Left flank pain 10/28/2022   Loss of weight 10/06/2022   Pancreatitis 09/19/2022   Acute renal failure superimposed on stage  3b chronic kidney disease 09/03/2022   Transaminitis 09/03/2022   Urinary frequency 02/18/2022   Dizziness 10/11/2021   Status post reverse total shoulder replacement, left 10/04/2021   S/p reverse total shoulder arthroplasty 10/01/2021   S/P reverse total shoulder arthroplasty, right 10/01/2021   Sinus bradycardia 10/01/2021   Chronic diastolic CHF (congestive heart failure) 10/01/2021   Stage 3b chronic kidney disease (CKD) 10/01/2021   Primary osteoarthritis of left shoulder 09/16/2021   Rotator cuff tendinitis, left 09/16/2021   Encounter for annual general medical examination with abnormal findings in adult 02/15/2021   Abdominal bloating 03/22/2019   Vaginal burning 03/22/2019   Acute pain of left shoulder 01/17/2019   Chronic musculoskeletal pain 11/15/2018   Atherosclerosis of native coronary artery of native heart with stable angina pectoris 05/17/2018   Chronic neck pain  02/10/2018   Chronic pain of right upper extremity 02/10/2018   Cervical spondylosis 02/10/2018   Neck pain 01/19/2018   Right shoulder pain 01/19/2018   Chronic pain syndrome 01/19/2018   Lumbar degenerative disc disease 09/29/2017   Lumbar spondylosis with myelopathy 09/29/2017   Chronic bilateral back pain 05/19/2017   Insomnia 01/12/2017   Cannabis use disorder, mild, abuse 09/03/2016   Cognitive changes 09/03/2016   Takotsubo cardiomyopathy 01/02/2016   Frequent PVCs 01/02/2016   HTN (hypertension) 09/20/2015   Benign neoplasm of colon 11/02/2014   Internal hemorrhoids 11/02/2014   Adjustment disorder with mixed anxiety and depressed mood 08/16/2014   Orthostatic hypotension 08/08/2014   Esophageal reflux 05/23/2014   Diverticulosis of colon without hemorrhage 05/17/2014   Peripheral polyneuropathy 05/05/2014   Tobacco dependence 05/05/2014   COPD (chronic obstructive pulmonary disease) 03/29/2013   Noncompliance with diabetes treatment 02/27/2013   Obstructive sleep apnea 02/25/2013   Chronic systolic congestive heart failure 05/21/2012   Lichen sclerosus et atrophicus 05/20/2012   Bronchitis, chronic obstructive (HCC) 05/20/2012   Uncontrolled type 2 diabetes mellitus with hypoglycemia, with long-term current use of insulin 08/28/2011   History of lumbar fusion 06/29/2011   Depression 06/24/2011   Hypothyroidism 05/03/2011   CAD S/P percutaneous coronary angioplasty 02/24/2011   Tobacco abuse 02/24/2011   Mixed hyperlipidemia 02/24/2011   Postural dizziness with near syncope 02/24/2011   Past Medical History:  Diagnosis Date   Aortic atherosclerosis    Bilateral cataracts 01/2015   a.) s/p extraction   CHF (congestive heart failure) 09/16/2013   a.) TTE 09/16/2013: EF 55-60%; mod inferior HK, triv TR; G1DD. b.) TTE 01/02/2016: EF 25-30%; mid-apicalateroseptal, lat, inf, inferoseptal, apical akinesis; triv TR; G1DD. c. TTE 04/16/2016: EF 65%; G1DD. d.) TTE  05/02/2020: EF 50-55%; G2DD. e.) TTE 04/11/2021: EF 55%; G1DD.   Chronic low back pain    Clostridioides difficile infection 2015   COPD (chronic obstructive pulmonary disease)    Coronary artery disease    a.) PCI 02/06/2011: 100% mLCx - DES x 3. b.) PCI 09/17/2011: 95% dRCA - DES x 1. c.) PCI 10/02/2011: 75% pLCX - DES x 1. d.) PCI 08/19/2012: DES x 2 p-mRCA.  e.) LHC 01/01/16: Takotsubo event 01/01/2016 with patent stents   Depression    Diverticulosis    Frequent PVCs    a. Noted in hospital 12/2015.   GERD (gastroesophageal reflux disease)    History of heart artery stent    a.) TOTAL of 7 stents: a.) 02/06/2011 - overlapping 2.5x12 mm Xience V, 2.5x8 mm Xience V, 2.25x8 mm Xience Nano to mLCx. b.) 09/17/2011 - 2.5x23 Xience V dRCA. c.) 10/02/2011 - 2.5x15 mm Xience  V pLCx. d.) 08/09/2012 - overlapping 3.0x56mm mRCA and 3.5x39mm pRCA   Hyperlipidemia    Hypertension    Hypothyroidism    Impingement syndrome of left shoulder    LBBB (left bundle branch block)    Long term current use of antithrombotics/antiplatelets    a.) clopidogrel   Marijuana abuse    Myocardial infarction    a.) multiple MIs;  5 per her report   OSA (obstructive sleep apnea)    a.) mild; does not require nocturnal PAP therapy   Paroxysmal SVT (supraventricular tachycardia) 05/08/2021   a.)  Zio patch 05/08/2021: 3 distinct SVT runs; fastest 5 beats at a rate of 146 bpm; longest 7 beats at rate of 134 bpm   T2DM (type 2 diabetes mellitus)    Takotsubo cardiomyopathy    a. 12/2015 - nephew committed suicide 1 week prior, sister died the morning of presentation - initially called a STEMI; cath with patent stents. LVEF 25-30%.   Tendonitis of left rotator cuff    Tobacco abuse    Vascular dementia    Past Surgical History:  Procedure Laterality Date   ABDOMINAL AORTOGRAM W/LOWER EXTREMITY N/A 09/12/2020   Procedure: ABDOMINAL AORTOGRAM W/LOWER EXTREMITY;  Surgeon: Iran Ouch, MD;  Location: [facility] INVASIVE CV  LAB;  Service: Cardiovascular;  Laterality: N/A;   ABDOMINAL HYSTERECTOMY     APPENDECTOMY     BACK SURGERY     CARDIAC CATHETERIZATION N/A 01/01/2016   Procedure: Left Heart Cath and Coronary Angiography;  Surgeon: Corky Crafts, MD;  Location: [facility] INVASIVE CV LAB;  Service: Cardiovascular;  Laterality: N/A;   CATARACT EXTRACTION W/PHACO Left 01/30/2015   Procedure: CATARACT EXTRACTION PHACO AND INTRAOCULAR LENS PLACEMENT (IOC);  Surgeon: Galen Manila, MD;  Location: [facility] ORS;  Service: Ophthalmology;  Laterality: Left;  [place] 00:47    CATARACT EXTRACTION W/PHACO Right 02/13/2015   Procedure: CATARACT EXTRACTION PHACO AND INTRAOCULAR LENS PLACEMENT (IOC);  Surgeon: Galen Manila, MD;  Location: [facility] ORS;  Service: Ophthalmology;  Laterality: Right;  cassette lot # [id number] H [place]  00:29.9 AP  20.7 CDE  6.20   COLONOSCOPY N/A 11/02/2014   Procedure: COLONOSCOPY;  Surgeon: Louis Meckel, MD;  Location: [facility] ENDOSCOPY;  Service: Endoscopy;  Laterality: N/A;   CORONARY ANGIOPLASTY WITH STENT PLACEMENT Left 02/06/2011   Procedure: CORONARY ANGIOPLASTY WITH STENT PLACEMENT; Location: [facility]; Surgeon: Rudean Hitt, MD   CORONARY ANGIOPLASTY WITH STENT PLACEMENT Left 09/17/2011   Procedure: CORONARY ANGIOPLASTY WITH STENT PLACEMENT; Location: [facility]; Surgeon: Rudean Hitt, MD   CORONARY ANGIOPLASTY WITH STENT PLACEMENT Left 10/02/2011   Procedure: CORONARY ANGIOPLASTY WITH STENT PLACEMENT; Location: [facility]; Surgeon: Rudean Hitt, MD   CORONARY ANGIOPLASTY WITH STENT PLACEMENT Left 08/19/2012   Procedure: CORONARY ANGIOPLASTY WITH STENT PLACEMENT; Location: [facility]; Surgeon: Lorine Bears, MD   ESOPHAGOGASTRODUODENOSCOPY (EGD) WITH PROPOFOL N/A 09/21/2018   Procedure: ESOPHAGOGASTRODUODENOSCOPY (EGD) WITH PROPOFOL;  Surgeon: Wyline Mood, MD;  Location: [facility] ENDOSCOPY;  Service: Gastroenterology;  Laterality: N/A;   ESOPHAGOGASTRODUODENOSCOPY (EGD) WITH PROPOFOL N/A 10/06/2022   Procedure:  ESOPHAGOGASTRODUODENOSCOPY (EGD) WITH PROPOFOL;  Surgeon: Wyline Mood, MD;  Location: [facility] ENDOSCOPY;  Service: Gastroenterology;  Laterality: N/A;   EYE SURGERY     LEFT HEART CATH AND CORONARY ANGIOGRAPHY Left 08/02/2014   Procedure: LEFT HEART CATH AND CORONARY ANGIOGRAPHY; Location: [facility]; Surgeon: Bryan Lemma, MD   PERIPHERAL VASCULAR ATHERECTOMY Left 09/12/2020   Procedure: PERIPHERAL VASCULAR ATHERECTOMY;  Surgeon: Iran Ouch, MD;  Location: [facility] INVASIVE CV LAB;  Service: Cardiovascular;  Laterality:  Left;   PERIPHERAL VASCULAR BALLOON ANGIOPLASTY  09/12/2020   Procedure: PERIPHERAL VASCULAR BALLOON ANGIOPLASTY;  Surgeon: Iran Ouch, MD;  Location: [facility] INVASIVE CV LAB;  Service: Cardiovascular;;   REVERSE SHOULDER ARTHROPLASTY Left 10/01/2021   Procedure: REVERSE SHOULDER ARTHROPLASTY WITH BICEPS TENODESIS.;  Surgeon: Christena Flake, MD;  Location: [facility] ORS;  Service: Orthopedics;  Laterality: Left;   SHOULDER SURGERY Left 2017   Social History   Tobacco Use   Smoking status: Every Day    Packs/day: 1.00    Years: 45.00    Additional pack years: 0.00    Total pack years: 45.00    Types: Cigarettes   Smokeless tobacco: Never   Tobacco comments:    Has cut back, trying to quit.   Vaping Use   Vaping Use: Never used  Substance Use Topics   Alcohol use: No    Alcohol/week: 0.0 standard drinks of alcohol   Drug use: Yes    Types: Marijuana    Comment: last noc   Family History  Problem Relation Age of Onset   Heart attack Mother        First MI @ 75 - Died @ 61   Heart disease Mother    Heart disease Father        Died @ 61   Throat cancer Brother    Liver cancer Brother    Colon cancer Sister    No Known Allergies Current Outpatient Medications on File Prior to Visit  Medication Sig Dispense Refill   albuterol (VENTOLIN HFA) 108 (90 Base) MCG/ACT inhaler Inhale 2 puffs into the lungs every 6 (six) hours as needed for wheezing or shortness of breath.  8 g 0   carvedilol (COREG) 3.125 MG tablet Take 1 tablet (3.125 mg total) by mouth 2 (two) times daily. 180 tablet 3   clobetasol cream (TEMOVATE) 0.05 % Apply 1 Application topically 2 (two) times daily as needed. For vaginal itching. 30 g 0   clopidogrel (PLAVIX) 75 MG tablet Take 1 tablet (75 mg total) by mouth daily. 90 tablet 3   Continuous Blood Gluc Receiver (FREESTYLE LIBRE 2 READER) DEVI Use with sensor to check blood sugars 6 times daily. 1 each 0   Continuous Blood Gluc Sensor (FREESTYLE LIBRE 2 SENSOR) MISC USE 1 SENSOR TO CHECK BLOOD SUGAR 6 TIMES DAILY - REPLACE EVERY 14 DAYS 6 each 0   ezetimibe (ZETIA) 10 MG tablet Take 1 tablet (10 mg total) by mouth daily. For cholesterol. 90 tablet 3   famotidine (PEPCID) 20 MG tablet Take 1 tablet (20 mg total) by mouth at bedtime. For nausea and acid 90 tablet 0   furosemide (LASIX) 20 MG tablet Take 1 tablet (20 mg total) by mouth as needed (As needed for weight gain greater than 3 pounds overnight or increased shortness of breath.). Take one tab only on Monday, Wednesday, & Fridays 90 tablet 3   gabapentin (NEURONTIN) 300 MG capsule Take 1 capsule (300 mg total) by mouth 2 (two) times daily. For pain 180 capsule 0   glucose blood (FREESTYLE LITE) test strip Test up to four times a day as needed. DX 100 each 0   HYDROcodone-acetaminophen (NORCO/VICODIN) 5-325 MG tablet Take 1 tablet by mouth every 4 (four) hours as needed. 15 tablet 0   insulin aspart (NOVOLOG FLEXPEN) 100 UNIT/ML FlexPen INJECT 10 UNITS INTO THE SKIN WITH BREAKFAST, 18 UNITS WITH LUNCH, AND 16 UNITS WITH DINNER FOR DIABETES 45 mL 0   insulin glargine (  LANTUS SOLOSTAR) 100 UNIT/ML Solostar Pen Inject 10 Units into the skin daily. for diabetes. 15 mL 2   Insulin Pen Needle (INSUPEN PEN NEEDLES) 32G X 4 MM MISC Use to inject insulin 4 times daily. 250 each 1   levothyroxine (SYNTHROID) 100 MCG tablet Take 100 mcg by mouth daily before breakfast.     mirtazapine (REMERON) 7.5 MG  tablet Take 1 tablet (7.5 mg total) by mouth at bedtime. For appetite and sleep. 90 tablet 0   omeprazole (PRILOSEC) 40 MG capsule Take 1 capsule (40 mg total) by mouth daily. 90 capsule 3   ondansetron (ZOFRAN-ODT) 4 MG disintegrating tablet Take 1 tablet (4 mg total) by mouth every 8 (eight) hours as needed for nausea or vomiting. 20 tablet 0   potassium chloride (KLOR-CON M) 10 MEQ tablet Only take on days you take your lasix. Take 1 tablet (10 mEq total) by mouth for 1 dose on those days you take your lasix 20mg  pill. 90 tablet 3   umeclidinium-vilanterol (ANORO ELLIPTA) 62.5-25 MCG/ACT AEPB Inhale 1 puff into the lungs daily. 60 each 0   No current facility-administered medications on file prior to visit.    Review of Systems  Constitutional:  Positive for appetite change, fatigue and unexpected weight change. Negative for activity change and fever.  HENT:  Negative for congestion, ear pain, rhinorrhea, sinus pressure, sore throat and trouble swallowing.   Eyes:  Negative for pain, redness and visual disturbance.  Respiratory:  Negative for cough, shortness of breath and wheezing.        Baseline copd   Cardiovascular:  Negative for chest pain, palpitations and leg swelling.  Gastrointestinal:  Positive for abdominal pain and nausea. Negative for abdominal distention, anal bleeding, blood in stool, constipation, diarrhea, rectal pain and vomiting.       Has not vomited in a week  Endocrine: Negative for polydipsia and polyuria.  Genitourinary:  Positive for frequency. Negative for dysuria, hematuria and urgency.  Musculoskeletal:  Positive for back pain. Negative for arthralgias and myalgias.  Skin:  Negative for pallor and rash.  Allergic/Immunologic: Negative for environmental allergies.  Neurological:  Negative for dizziness, syncope and headaches.  Hematological:  Negative for adenopathy. Does not bruise/bleed easily.  Psychiatric/Behavioral:  Negative for decreased concentration and  dysphoric mood. The patient is not nervous/anxious.        Objective:   Physical Exam        Assessment & Plan:   Problem List Items Addressed This Visit       Digestive   Pancreatitis    H/o pancreatitis in past? Elevated LFTs Reviewed past ER notes and lipase   No alcohol   Lab today F/u pcp in 2 d as planned      Relevant Orders   Basic metabolic panel   CBC with Differential/Platelet   Hepatic function panel   Lipase     Genitourinary   Stage 3b chronic kidney disease (CKD)    L flank pain  Recent wt loss  Rev last hosp notes and CT and [place]   Lab today F/u pcp 4/11        Relevant Orders   Basic metabolic panel     Other   Left flank pain    Per pt different than usual pain (she sees pain management)  Very complex medical history incl recent wt loss and inability to eat  Ua clear- dip and micro Tender in L upper abd/ epig and flank Reviewed chart in  detail incl hosp info/ CT / [place]  Reviewed hospital records, lab results and studies in detail  Is on ppi and H2 and zofran (refilled that for nausea short term)  Has baseline ckd and elevated lft  Has remote h/o pancreatitis=(per pt no alcohol)   Today- lab incl cbc/ lipase/renal/hepatic   Cannot tx pain as she sees pain specialist  ER precautions noted          Relevant Orders   Basic metabolic panel   CBC with Differential/Platelet   Hepatic function panel   Transaminitis    L flank pain  Reviewed record ? Of pancreatitis in past  No etoh or gall stones Some nausea  Some wt loss Rev CT and [place]  Lab today      Relevant Orders   Hepatic function panel   Urinary frequency - Primary    Ua today is clear on dip and micro (did micro myself)        Relevant Orders   POCT Urinalysis Dipstick (Automated) (Completed)   POCT UA - Microscopic Only (Completed)

## 2022-10-28 NOTE — Assessment & Plan Note (Signed)
Ua today is clear on dip and micro (did micro myself)

## 2022-10-28 NOTE — Assessment & Plan Note (Addendum)
Per pt different than usual pain (she sees pain management)  Very complex medical history incl recent wt loss and inability to eat  Ua clear- dip and micro Tender in L upper abd/ epig and flank Reviewed chart in detail incl hosp info/ CT / [place]  Reviewed hospital records, lab results and studies in detail  Is on ppi and H2 and zofran (refilled that for nausea short term)  Has baseline ckd and elevated lft  Has remote h/o pancreatitis=(per pt no alcohol)   Today- lab incl cbc/ lipase/renal/hepatic   Cannot tx pain as she sees pain specialist  ER precautions noted

## 2022-10-28 NOTE — Assessment & Plan Note (Signed)
H/o pancreatitis in past? Elevated LFTs Reviewed past ER notes and lipase   No alcohol   Lab today F/u pcp in 2 d as planned

## 2022-10-28 NOTE — Patient Instructions (Signed)
Urine is clear Keep drinking water   Labs today  Keep your appt with Jae Dire on 4/11    I sent in nausea medicine

## 2022-10-28 NOTE — Assessment & Plan Note (Signed)
L flank pain  Recent wt loss  Rev last hosp notes and CT and [place]   Lab today F/u pcp 4/11

## 2022-10-28 NOTE — Assessment & Plan Note (Signed)
L flank pain  Reviewed record ? Of pancreatitis in past  No etoh or gall stones Some nausea  Some wt loss Rev CT and [place]  Lab today

## 2022-10-30 ENCOUNTER — Ambulatory Visit: Payer: Medicare PPO | Admitting: Primary Care

## 2022-10-31 ENCOUNTER — Ambulatory Visit (INDEPENDENT_AMBULATORY_CARE_PROVIDER_SITE_OTHER): Payer: Medicare PPO | Admitting: Primary Care

## 2022-10-31 ENCOUNTER — Encounter: Payer: Self-pay | Admitting: Primary Care

## 2022-10-31 VITALS — BP 162/88 | HR 65 | Temp 97.3°F | Ht 66.0 in | Wt 133.0 lb

## 2022-10-31 DIAGNOSIS — E11649 Type 2 diabetes mellitus with hypoglycemia without coma: Secondary | ICD-10-CM

## 2022-10-31 DIAGNOSIS — Z794 Long term (current) use of insulin: Secondary | ICD-10-CM | POA: Diagnosis not present

## 2022-10-31 DIAGNOSIS — E1151 Type 2 diabetes mellitus with diabetic peripheral angiopathy without gangrene: Secondary | ICD-10-CM | POA: Diagnosis not present

## 2022-10-31 LAB — POCT GLYCOSYLATED HEMOGLOBIN (HGB A1C): Hemoglobin A1C: 7.8 % — AB (ref 4.0–5.6)

## 2022-10-31 MED ORDER — LANTUS SOLOSTAR 100 UNIT/ML ~~LOC~~ SOPN: 14.0000 [IU] | PEN_INJECTOR | Freq: Every day | SUBCUTANEOUS | 2 refills | Status: DC

## 2022-10-31 NOTE — Assessment & Plan Note (Signed)
Improved with A1C today of 7.8!  Increase Lantus to 14 units HS to hopefully avoid her having to take Novolog twice in the AM.  Continue Novlog 10-18 units TID with meals.  Follow up in early August.

## 2022-10-31 NOTE — Progress Notes (Signed)
Subjective:    Patient ID: Sarah Payne, female    DOB: January 14, 1946, 77 y.o.   MRN: [id number]  HPI  Sarah Payne is a very pleasant 77 y.o. female with a significant medical history including CAD, CHF, COPD, OSA, uncontrolled type 2 diabetes, hypothyroidism, chronic pain syndrome, dizziness, anxiety and depression who presents today for follow up of diabetes.  Current medications include: Lantus 10 units daily, Novolog 10-18 units TID with meals.  She has been injecting 2 doses Novolog in the morning, 14-16 units each time. She denies low readings in the 50's.   She is checking her blood glucose continuously.  AM fasting: low 200's Before lunch: 120's Bedtime: 120's  Last A1C: 8.4 in January 2024, 7.8 today  Last Eye Exam: Due Last Foot Exam: UTD Pneumonia Vaccination: 2020 Urine Microalbumin: UTD Statin: None  Dietary changes since last visit: Decreased appetite.    Exercise: None.  BP Readings from Last 3 Encounters:  10/31/22 (!) 162/88  10/28/22 (!) 150/82  10/06/22 111/69         Review of Systems  Eyes:  Negative for visual disturbance.  Cardiovascular:  Negative for chest pain.  Neurological:  Positive for dizziness and numbness.         Past Medical History:  Diagnosis Date   Aortic atherosclerosis    Bilateral cataracts 01/2015   a.) s/p extraction   CHF (congestive heart failure) 09/16/2013   a.) TTE 09/16/2013: EF 55-60%; mod inferior HK, triv TR; G1DD. b.) TTE 01/02/2016: EF 25-30%; mid-apicalateroseptal, lat, inf, inferoseptal, apical akinesis; triv TR; G1DD. c. TTE 04/16/2016: EF 65%; G1DD. d.) TTE 05/02/2020: EF 50-55%; G2DD. e.) TTE 04/11/2021: EF 55%; G1DD.   Chronic low back pain    Clostridioides difficile infection 2015   COPD (chronic obstructive pulmonary disease)    Coronary artery disease    a.) PCI 02/06/2011: 100% mLCx - DES x 3. b.) PCI 09/17/2011: 95% dRCA - DES x 1. c.) PCI 10/02/2011: 75% pLCX - DES x 1. d.) PCI  08/19/2012: DES x 2 p-mRCA.  e.) LHC 01/01/16: Takotsubo event 01/01/2016 with patent stents   Depression    Diverticulosis    Frequent PVCs    a. Noted in hospital 12/2015.   GERD (gastroesophageal reflux disease)    History of heart artery stent    a.) TOTAL of 7 stents: a.) 02/06/2011 - overlapping 2.5x12 mm Xience V, 2.5x8 mm Xience V, 2.25x8 mm Xience Nano to mLCx. b.) 09/17/2011 - 2.5x23 Xience V dRCA. c.) 10/02/2011 - 2.5x15 mm Xience V pLCx. d.) 08/09/2012 - overlapping 3.0x34mm mRCA and 3.5x73mm pRCA   Hyperlipidemia    Hypertension    Hypothyroidism    Impingement syndrome of left shoulder    LBBB (left bundle branch block)    Long term current use of antithrombotics/antiplatelets    a.) clopidogrel   Marijuana abuse    Myocardial infarction    a.) multiple MIs;  5 per her report   OSA (obstructive sleep apnea)    a.) mild; does not require nocturnal PAP therapy   Paroxysmal SVT (supraventricular tachycardia) 05/08/2021   a.)  Zio patch 05/08/2021: 3 distinct SVT runs; fastest 5 beats at a rate of 146 bpm; longest 7 beats at rate of 134 bpm   T2DM (type 2 diabetes mellitus)    Takotsubo cardiomyopathy    a. 12/2015 - nephew committed suicide 1 week prior, sister died the morning of presentation - initially called a STEMI; cath with patent  stents. LVEF 25-30%.   Tendonitis of left rotator cuff    Tobacco abuse    Vascular dementia     Social History   Socioeconomic History   Marital status: Married    Spouse name: Not on file   Number of children: Not on file   Years of education: Not on file   Highest education level: Not on file  Occupational History   Not on file  Tobacco Use   Smoking status: Every Day    Packs/day: 1.00    Years: 45.00    Additional pack years: 0.00    Total pack years: 45.00    Types: Cigarettes   Smokeless tobacco: Never   Tobacco comments:    Has cut back, trying to quit.   Vaping Use   Vaping Use: Never used  Substance and Sexual  Activity   Alcohol use: No    Alcohol/week: 0.0 standard drinks of alcohol   Drug use: Yes    Types: Marijuana    Comment: last noc   Sexual activity: Not on file  Other Topics Concern   Not on file  Social History Narrative   Lives at home with her husband in [place].  Previously used marijuana - quit.      Regular exercise: no/ pain from a frozen rotator cuff   Caffeine use: coffee daily and pepsi      Does not have a living will.   Daughters and husband know her wishes- would desire CPR but not prolonged life support if futile   Social Determinants of Health   Financial Resource Strain: Low Risk  (08/06/2022)   Overall Financial Resource Strain (CARDIA)    Difficulty of Paying Living Expenses: Not hard at all  Food Insecurity: No Food Insecurity (09/04/2022)   Hunger Vital Sign    Worried About Running Out of Food in the Last Year: Never true    Ran Out of Food in the Last Year: Never true  Transportation Needs: No Transportation Needs (09/04/2022)   PRAPARE - Administrator, Civil Service (Medical): No    Lack of Transportation (Non-Medical): No  Physical Activity: Inactive (08/06/2022)   Exercise Vital Sign    Days of Exercise per Week: 0 days    Minutes of Exercise per Session: 0 min  Stress: No Stress Concern Present (08/06/2022)   [employer] of Occupational Health - Occupational Stress Questionnaire    Feeling of Stress : Only a little  Social Connections: Socially Isolated (08/06/2022)   Social Connection and Isolation Panel [NHANES]    Frequency of Communication with Friends and Family: Never    Frequency of Social Gatherings with Friends and Family: Never    Attends Religious Services: Never    Database administrator or Organizations: No    Attends Banker Meetings: Never    Marital Status: Married  Catering manager Violence: Not At Risk (09/04/2022)   Humiliation, Afraid, Rape, and Kick questionnaire    Fear of Current or  Ex-Partner: No    Emotionally Abused: No    Physically Abused: No    Sexually Abused: No    Past Surgical History:  Procedure Laterality Date   ABDOMINAL AORTOGRAM W/LOWER EXTREMITY N/A 09/12/2020   Procedure: ABDOMINAL AORTOGRAM W/LOWER EXTREMITY;  Surgeon: Iran Ouch, MD;  Location: [facility] INVASIVE CV LAB;  Service: Cardiovascular;  Laterality: N/A;   ABDOMINAL HYSTERECTOMY     APPENDECTOMY     BACK SURGERY     CARDIAC  CATHETERIZATION N/A 01/01/2016   Procedure: Left Heart Cath and Coronary Angiography;  Surgeon: Corky Crafts, MD;  Location: [facility] INVASIVE CV LAB;  Service: Cardiovascular;  Laterality: N/A;   CATARACT EXTRACTION W/PHACO Left 01/30/2015   Procedure: CATARACT EXTRACTION PHACO AND INTRAOCULAR LENS PLACEMENT (IOC);  Surgeon: Galen Manila, MD;  Location: [facility] ORS;  Service: Ophthalmology;  Laterality: Left;  [place] 00:47    CATARACT EXTRACTION W/PHACO Right 02/13/2015   Procedure: CATARACT EXTRACTION PHACO AND INTRAOCULAR LENS PLACEMENT (IOC);  Surgeon: Galen Manila, MD;  Location: [facility] ORS;  Service: Ophthalmology;  Laterality: Right;  cassette lot # [id number] H [place]  00:29.9 AP  20.7 CDE  6.20   COLONOSCOPY N/A 11/02/2014   Procedure: COLONOSCOPY;  Surgeon: Louis Meckel, MD;  Location: [facility] ENDOSCOPY;  Service: Endoscopy;  Laterality: N/A;   CORONARY ANGIOPLASTY WITH STENT PLACEMENT Left 02/06/2011   Procedure: CORONARY ANGIOPLASTY WITH STENT PLACEMENT; Location: [facility]; Surgeon: Rudean Hitt, MD   CORONARY ANGIOPLASTY WITH STENT PLACEMENT Left 09/17/2011   Procedure: CORONARY ANGIOPLASTY WITH STENT PLACEMENT; Location: [facility]; Surgeon: Rudean Hitt, MD   CORONARY ANGIOPLASTY WITH STENT PLACEMENT Left 10/02/2011   Procedure: CORONARY ANGIOPLASTY WITH STENT PLACEMENT; Location: [facility]; Surgeon: Rudean Hitt, MD   CORONARY ANGIOPLASTY WITH STENT PLACEMENT Left 08/19/2012   Procedure: CORONARY ANGIOPLASTY WITH STENT PLACEMENT; Location: [facility]; Surgeon: Lorine Bears, MD   ESOPHAGOGASTRODUODENOSCOPY (EGD) WITH PROPOFOL N/A 09/21/2018   Procedure: ESOPHAGOGASTRODUODENOSCOPY (EGD) WITH PROPOFOL;  Surgeon: Wyline Mood, MD;  Location: [facility] ENDOSCOPY;  Service: Gastroenterology;  Laterality: N/A;   ESOPHAGOGASTRODUODENOSCOPY (EGD) WITH PROPOFOL N/A 10/06/2022   Procedure: ESOPHAGOGASTRODUODENOSCOPY (EGD) WITH PROPOFOL;  Surgeon: Wyline Mood, MD;  Location: [facility] ENDOSCOPY;  Service: Gastroenterology;  Laterality: N/A;   EYE SURGERY     LEFT HEART CATH AND CORONARY ANGIOGRAPHY Left 08/02/2014   Procedure: LEFT HEART CATH AND CORONARY ANGIOGRAPHY; Location: [facility]; Surgeon: Bryan Lemma, MD   PERIPHERAL VASCULAR ATHERECTOMY Left 09/12/2020   Procedure: PERIPHERAL VASCULAR ATHERECTOMY;  Surgeon: Iran Ouch, MD;  Location: [facility] INVASIVE CV LAB;  Service: Cardiovascular;  Laterality: Left;   PERIPHERAL VASCULAR BALLOON ANGIOPLASTY  09/12/2020   Procedure: PERIPHERAL VASCULAR BALLOON ANGIOPLASTY;  Surgeon: Iran Ouch, MD;  Location: [facility] INVASIVE CV LAB;  Service: Cardiovascular;;   REVERSE SHOULDER ARTHROPLASTY Left 10/01/2021   Procedure: REVERSE SHOULDER ARTHROPLASTY WITH BICEPS TENODESIS.;  Surgeon: Christena Flake, MD;  Location: [facility] ORS;  Service: Orthopedics;  Laterality: Left;   SHOULDER SURGERY Left 2017    Family History  Problem Relation Age of Onset   Heart attack Mother        First MI @ 56 - Died @ 86   Heart disease Mother    Heart disease Father        Died @ 69   Throat cancer Brother    Liver cancer Brother    Colon cancer Sister     No Known Allergies  Current Outpatient Medications on File Prior to Visit  Medication Sig Dispense Refill   albuterol (VENTOLIN HFA) 108 (90 Base) MCG/ACT inhaler Inhale 2 puffs into the lungs every 6 (six) hours as needed for wheezing or shortness of breath. 8 g 0   carvedilol (COREG) 3.125 MG tablet Take 1 tablet (3.125 mg total) by mouth 2 (two) times daily. 180 tablet 3   clobetasol  cream (TEMOVATE) 0.05 % Apply 1 Application topically 2 (two) times daily as needed. For vaginal itching. 30 g 0   clopidogrel (PLAVIX) 75 MG tablet Take 1 tablet (  75 mg total) by mouth daily. 90 tablet 3   Continuous Blood Gluc Receiver (FREESTYLE LIBRE 2 READER) DEVI Use with sensor to check blood sugars 6 times daily. 1 each 0   Continuous Blood Gluc Sensor (FREESTYLE LIBRE 2 SENSOR) MISC USE 1 SENSOR TO CHECK BLOOD SUGAR 6 TIMES DAILY - REPLACE EVERY 14 DAYS 6 each 0   ezetimibe (ZETIA) 10 MG tablet Take 1 tablet (10 mg total) by mouth daily. For cholesterol. 90 tablet 3   famotidine (PEPCID) 20 MG tablet Take 1 tablet (20 mg total) by mouth at bedtime. For nausea and acid 90 tablet 0   furosemide (LASIX) 20 MG tablet Take 1 tablet (20 mg total) by mouth as needed (As needed for weight gain greater than 3 pounds overnight or increased shortness of breath.). Take one tab only on Monday, Wednesday, & Fridays 90 tablet 3   gabapentin (NEURONTIN) 300 MG capsule Take 1 capsule (300 mg total) by mouth 2 (two) times daily. For pain 180 capsule 0   glucose blood (FREESTYLE LITE) test strip Test up to four times a day as needed. DX 100 each 0   HYDROcodone-acetaminophen (NORCO/VICODIN) 5-325 MG tablet Take 1 tablet by mouth every 4 (four) hours as needed. 15 tablet 0   insulin aspart (NOVOLOG FLEXPEN) 100 UNIT/ML FlexPen INJECT 10 UNITS INTO THE SKIN WITH BREAKFAST, 18 UNITS WITH LUNCH, AND 16 UNITS WITH DINNER FOR DIABETES 45 mL 0   Insulin Pen Needle (INSUPEN PEN NEEDLES) 32G X 4 MM MISC Use to inject insulin 4 times daily. 250 each 1   levothyroxine (SYNTHROID) 100 MCG tablet Take 100 mcg by mouth daily before breakfast.     mirtazapine (REMERON) 7.5 MG tablet Take 1 tablet (7.5 mg total) by mouth at bedtime. For appetite and sleep. 90 tablet 0   omeprazole (PRILOSEC) 40 MG capsule Take 1 capsule (40 mg total) by mouth daily. 90 capsule 3   ondansetron (ZOFRAN) 4 MG tablet Take 1 tablet (4 mg total) by  mouth every 6 (six) hours as needed for nausea. 15 tablet 0   ondansetron (ZOFRAN-ODT) 4 MG disintegrating tablet Take 1 tablet (4 mg total) by mouth every 8 (eight) hours as needed for nausea or vomiting. 20 tablet 0   potassium chloride (KLOR-CON M) 10 MEQ tablet Only take on days you take your lasix. Take 1 tablet (10 mEq total) by mouth for 1 dose on those days you take your lasix 20mg  pill. 90 tablet 3   umeclidinium-vilanterol (ANORO ELLIPTA) 62.5-25 MCG/ACT AEPB Inhale 1 puff into the lungs daily. 60 each 0   No current facility-administered medications on file prior to visit.    BP (!) 162/88   Pulse 65   Temp (!) 97.3 F (36.3 C) (Temporal)   Ht 5\' 6"  (1.676 m)   Wt 133 lb (60.3 kg)   SpO2 99%   BMI 21.47 kg/m  Objective:   Physical Exam Cardiovascular:     Rate and Rhythm: Normal rate and regular rhythm.  Pulmonary:     Effort: Pulmonary effort is normal.     Breath sounds: Normal breath sounds.  Musculoskeletal:     Cervical back: Neck supple.  Skin:    General: Skin is warm and dry.           Assessment & Plan:  Diabetes mellitus type 2 with peripheral artery disease -     POCT glycosylated hemoglobin (Hb A1C) -     Lantus SoloStar; Inject 14 Units  into the skin daily. for diabetes.  Dispense: 15 mL; Refill: 2  Uncontrolled type 2 diabetes mellitus with hypoglycemia, with long-term current use of insulin Assessment & Plan: Improved with A1C today of 7.8!  Increase Lantus to 14 units HS to hopefully avoid her having to take Novolog twice in the AM.  Continue Novlog 10-18 units TID with meals.  Follow up in early August.   Orders: -     POCT glycosylated hemoglobin (Hb A1C)        Doreene Nest, NP

## 2022-11-03 ENCOUNTER — Other Ambulatory Visit (HOSPITAL_COMMUNITY): Payer: Self-pay | Admitting: Physician Assistant

## 2022-11-03 DIAGNOSIS — I739 Peripheral vascular disease, unspecified: Secondary | ICD-10-CM

## 2022-11-04 ENCOUNTER — Ambulatory Visit: Payer: Self-pay

## 2022-11-04 NOTE — Patient Instructions (Signed)
Visit Information  Thank you for taking time to visit with me today. Please don't hesitate to contact me if I can be of assistance to you.   Following are the goals we discussed today:  Weigh daily and record Take Lantas/ Novolog as prescribed by provider and as instructed by practice pharmacist Review education articles sent to you on heart failure action plan and diabetes management Please keep scheduled follow up appointments Reminder:  Your follow up gastroenterology appointment is scheduled for 03/09/23 at 1:00 pm.  You have also been put on a waiting list and therefore may get a call sooner for appointment.  Consider smoking cessation Notify provider for falls.   Our next appointment is by telephone on 11/26/22 at 10:30 am  Please call the care guide team at [phone] if you need to cancel or reschedule your appointment.   If you are experiencing a Mental Health or Behavioral Health Crisis or need someone to talk to, please call the Suicide and Crisis Lifeline: 988 call 1-800-273-TALK (toll free, 24 hour hotline)  The patient verbalized understanding of instructions, educational materials, and care plan provided today and agreed to receive a mailed copy of patient instructions, educational materials, and care plan.   [name] RN,BSN,CCM [facility] Care Coordination [phone] direct line  Type 2 Diabetes Mellitus, Self-Care, Adult Caring for yourself after you have been diagnosed with type 2 diabetes (type 2 diabetes mellitus) means keeping your blood sugar (glucose) under control with a balance of: Nutrition. Exercise. Lifestyle changes. Medicines or insulin, if needed. Support from your team of health care providers and others. What are the risks? Having type 2 diabetes can put you at risk for other long-term (chronic) conditions, such as heart disease and kidney disease. Your health care provider may prescribe medicines to help prevent complications from diabetes. How to  monitor your blood glucose  Check your blood glucose every day or as often as told by your health care provider. Have your A1C (hemoglobin A1C) level checked two or more times a year, or as often as told by your health care provider. Your health care provider will set personalized treatment goals for you. Generally, the goal of treatment is to maintain the following blood glucose levels: Before meals: 80-130 mg/dL (2.9-5.6 mmol/L). After meals: below 180 mg/dL (10 mmol/L). A1C level: less than 7%. How to manage hyperglycemia and hypoglycemia Hyperglycemia symptoms Hyperglycemia, also called high blood glucose, occurs when blood glucose is too high. Make sure you know the early signs of hyperglycemia, such as: Increased thirst. Hunger. Feeling very tired. Needing to urinate more often than usual. Blurry vision. Hypoglycemia symptoms Hypoglycemia, also called low blood glucose, occurs with a blood glucose level at or below 70 mg/dL (3.9 mmol/L). Diabetes medicines lower your blood glucose and can cause hypoglycemia. The risk for hypoglycemia increases during or after exercise, during sleep, during illness, and when skipping meals or not eating for a long time (fasting). It is important to know the symptoms of hypoglycemia and treat it right away. Always have a 15-gram rapid-acting carbohydrate snack with you to treat low blood glucose. Family members and close friends should also know the symptoms and understand how to treat hypoglycemia, in case you are not able to treat yourself. Symptoms may include: Hunger. Anxiety. Sweating and feeling clammy. Dizziness or feeling light-headed. Sleepiness. Increased heart rate. Irritability. Tingling or numbness around the mouth, lips, or tongue. Restless sleep. Severe hypoglycemia is when your blood glucose level is at or below 54 mg/dL (  3 mmol/L). Severe hypoglycemia is an emergency. Do not wait to see if the symptoms will go away. Get medical help  right away. Call your local emergency services (911 in the [place] Do not drive yourself to the hospital. If you have severe hypoglycemia and you cannot eat or drink, you may need glucagon. A family member or close friend should learn how to check your blood glucose and how to give you glucagon. Ask your health care provider if you need to have an emergency glucagon kit available. Follow these instructions at home: Medicines Take prescribed insulin or diabetes medicines as told by your health care provider. Do not run out of insulin or other diabetes medicines. Plan ahead so you always have these available. If you use insulin, adjust your dosage based on your physical activity and what foods you eat. Your health care provider will tell you how to adjust your dosage. Take over-the-counter and prescription medicines only as told by your health care provider. Eating and drinking  What you eat and drink affects your blood glucose and your insulin dosage. Making good choices helps to control your diabetes and prevent other health problems. A healthy meal plan includes eating lean proteins, complex carbohydrates, fresh fruits and vegetables, low-fat dairy products, and healthy fats. Make an appointment to see a registered dietitian to help you create an eating plan that is right for you. Make sure that you: Follow instructions from your health care provider about eating or drinking restrictions. Drink enough fluid to keep your urine pale yellow. Keep a record of the carbohydrates that you eat. Do this by reading food labels and learning the standard serving sizes of foods. Follow your sick-day plan whenever you cannot eat or drink as usual. Make this plan in advance with your health care provider.  Activity Stay active. Exercise regularly, as told by your health care provider. This may include: Stretching and doing strength exercises, such as yoga or weight lifting, two or more times a week. Doing 150  minutes or more of moderate-intensity or vigorous-intensity exercise each week. This could be brisk walking, biking, or water aerobics. Spread out your activity over 3 or more days of the week. Do not go more than 2 days in a row without doing some kind of physical activity. When you start a new exercise or activity, work with your health care provider to adjust your insulin, medicines, or food intake as needed. Lifestyle Do not use any products that contain nicotine or tobacco. These products include cigarettes, chewing tobacco, and vaping devices, such as e-cigarettes. If you need help quitting, ask your health care provider. If you drink alcohol and your health care provider says that it is safe for you: Limit how much you have to: 0-1 drink a day for women who are not pregnant. 0-2 drinks a day for men. Know how much alcohol is in your drink. In the [place] one drink equals one 12 oz bottle of beer (355 mL), one 5 oz glass of wine (148 mL), or one 1 oz glass of hard liquor (44 mL). Learn to manage stress. If you need help with this, ask your health care provider. Take care of your body  Keep your immunizations up to date. In addition to getting vaccinations as told by your health care provider, it is recommended that you get vaccinated against the following illnesses: The flu (influenza). Get a flu shot every year. Pneumonia. Hepatitis B. Schedule an eye exam soon after your diagnosis, and then  one time every year after that. Check your skin and feet every day for cuts, bruises, redness, blisters, or sores. Schedule a foot exam with your health care provider once every year. Brush your teeth and gums two times a day, and floss one or more times a day. Visit your dentist one or more times every 6 months. Maintain a healthy weight. General instructions Share your diabetes management plan with people in your workplace, school, and household. Carry a medical alert card or wear medical alert  jewelry. Keep all follow-up visits. This is important. Questions to ask your health care provider Should I meet with a certified diabetes care and education specialist? Where can I find a support group for people with diabetes? Where to find more information For help and guidance and for more information about diabetes, please visit: American Diabetes Association (ADA): www.diabetes.org American Association of Diabetes Care and Education Specialists (ADCES): www.diabeteseducator.org International Diabetes Federation (IDF): DCOnly.dk Summary Caring for yourself after you have been diagnosed with type 2 diabetes (type 2 diabetes mellitus) means keeping your blood sugar (glucose) under control with a balance of nutrition, exercise, lifestyle changes, and medicine. Check your blood glucose every day, as often as told by your health care provider. Having diabetes can put you at risk for other long-term (chronic) conditions, such as heart disease and kidney disease. Your health care provider may prescribe medicines to help prevent complications from diabetes. Share your diabetes management plan with people in your workplace, school, and household. Keep all follow-up visits. This is important. This information is not intended to replace advice given to you by your health care provider. Make sure you discuss any questions you have with your health care provider. Document Revised: 12/05/2020 Document Reviewed: 12/05/2020 Elsevier Patient Education  2023 Elsevier Inc.  Heart Failure Action Plan A heart failure action plan helps you understand what to do when you have symptoms of heart failure. Your action plan is a color-coded plan that lists the symptoms to watch for and indicates what actions to take. If you have symptoms in the red zone, you need medical care right away. If you have symptoms in the yellow zone, you are having problems. If you have symptoms in the Frederik Standley zone, you are doing  well. Follow the plan that was created by you and your health care provider. Review your plan each time you visit your health care provider. Red zone These signs and symptoms mean you should get medical help right away: You have trouble breathing when resting. You have a dry cough that is getting worse. You have swelling or pain in your legs or abdomen that is getting worse. You suddenly gain more than 2-3 lb (0.9-1.4 kg) in 24 hours, or more than 5 lb (2.3 kg) in a week. This amount may be more or less depending on your condition. You have trouble staying awake or you feel confused. You have chest pain. You do not have an appetite. You pass out. You have worsening sadness or depression. If you have any of these symptoms, call your local emergency services (911 in the [place] right away. Do not drive yourself to the hospital. Yellow zone These signs and symptoms mean your condition may be getting worse and you should make some changes: You have trouble breathing when you are active, or you need to sleep with your head raised on extra pillows to help you breathe. You have swelling in your legs or abdomen. You gain 2-3 lb (0.9-1.4 kg) in 24  hours, or 5 lb (2.3 kg) in a week. This amount may be more or less depending on your condition. You get tired easily. You have trouble sleeping. You have a dry cough. If you have any of these symptoms: Contact your health care provider within the next day. Your health care provider may adjust your medicines. [place]by [place]vard zone These signs mean you are doing well and can continue what you are doing: You do not have shortness of breath. You have very little swelling or no new swelling. Your weight is stable (no gain or loss). You have a normal activity level. You do not have chest pain or any other new symptoms. Follow these instructions at home: Take over-the-counter and prescription medicines only as told by your health care provider. Weigh yourself daily.  Your target weight is __________ lb (__________ kg). Call your health care provider if you gain more than __________ lb (__________ kg) in 24 hours, or more than __________ lb (__________ kg) in a week. Health care provider name: _____________________________________________________ Health care provider phone number: _____________________________________________________ Eat a heart-healthy diet. Work with a diet and nutrition specialist (dietitian) to create an eating plan that is best for you. Keep all follow-up visits. This is important. Where to find more information American Heart Association: Summary A heart failure action plan helps you understand what to do when you have symptoms of heart failure. Follow the action plan that was created by you and your health care provider. Get help right away if you have any symptoms in the red zone. This information is not intended to replace advice given to you by your health care provider. Make sure you discuss any questions you have with your health care provider. Document Revised: 10/15/2021 Document Reviewed: 02/20/2020 Elsevier Patient Education  2023 [employer].

## 2022-11-04 NOTE — Patient Outreach (Signed)
  Care Coordination   Initial Visit Note   11/04/2022 Name: JOLLY Payne MRN: [id number] DOB: 1945-09-17  SHAWNTA Payne is a 77 y.o. year old female who sees Doreene Nest, NP for primary care. I spoke with  [name] by phone today.  What matters to the patients health and wellness today?  Patient reports recent medication dosage increase with Lantas on 10/31/22. She reports taking Lantas daily.  Reinforced importance of taking her Lantas as well as Novolog correctly.  She reports most recent Hgb A1c of 7.8.  She states she checks her blood sugar numerous times a day due to having [employer].  Patient reports blood sugar range of 58 to 170.  She states her blood sugar may drop 2 times per week below 70.   She reports treating low blood sugar with drinking PEPSI.   Patient denies any increase in heart failure symptoms. She reports she is not weighing daily.   Reports current weight is 133 lbs. Patient states she continues to smoke approximately 1 ppd.  She denies readiness to quit smoking. Patient reports having 5 falls over the past year. She states she blacks out.  Patient states her doctors are aware of this and have completed extensive testing with no findings. Patient reports last fall was approximately 6 months ago.  Denies any serious injury with past 5 falls.    Goals Addressed             This Visit's Progress    Management and education regarding health conditions       Interventions Today    Flowsheet Row Most Recent Value  Chronic Disease   Chronic disease during today's visit Diabetes, Congestive Heart Failure (CHF), Other  [elevated LFT's]  General Interventions   General Interventions Discussed/Reviewed General Interventions Discussed, Labs, Doctor Visits  [Evaluation of current treatment plan for DM, HF, elevate LFT's and patients adherence to plan as established by provider. Offered smoking cessation assistance. Assessed for blood sugar range and current  weight.]  Labs Hgb A1c every 6 months  [Discussed Hgb A1c goal and labs reviewed.]  Doctor Visits Discussed/Reviewed Doctor Visits Discussed  [Discussed scheduled/ upcoming appointments. Called Gastroenterology office and rescheduled appointment for patient. Requested patient be put on Gastroenterologist appointment wait list.]  Education Interventions   Education Provided Provided Printed Education, Provided Education  [Education articles on heart failure action plan and diabetes management mailed to patient. Discussed importance of weighing everyday and recording.]  Provided Verbal Education On When to see the doctor  Pharmacy Interventions   Pharmacy Dicussed/Reviewed Pharmacy Topics Discussed  [Discussed importance of weighing daily and taking lasix as recommended by provider for 3 lb weight gain. Discussed importance of taking diabetic medications as prescribed.]  Safety Interventions   Safety Discussed/Reviewed Fall Risk  [assessed for falls. Discussed fall safety and advised to notify provider of injuries especially head injury.]              SDOH assessments and interventions completed:  Yes  SDOH Interventions Today    Flowsheet Row Most Recent Value  SDOH Interventions   Food Insecurity Interventions Intervention Not Indicated  Housing Interventions Intervention Not Indicated  Transportation Interventions Intervention Not Indicated        Care Coordination Interventions:  Yes, provided   Follow up plan: Follow up call scheduled for 11/26/22    Encounter Outcome:  Pt. Visit Completed   [name] RN,BSN,CCM [facility] Care Coordination [phone] direct line

## 2022-11-06 ENCOUNTER — Telehealth: Payer: Self-pay | Admitting: *Deleted

## 2022-11-06 DIAGNOSIS — I739 Peripheral vascular disease, unspecified: Secondary | ICD-10-CM

## 2022-11-06 NOTE — Telephone Encounter (Signed)
Orders for repeat testing.

## 2022-11-17 ENCOUNTER — Telehealth: Payer: Self-pay

## 2022-11-17 NOTE — Progress Notes (Unsigned)
Care Management & Coordination Services Pharmacy Team  Reason for Encounter: Appointment Reminder  Contacted patient to confirm telephone appointment with Al Corpus, PharmD on 11/20/2022 at 2:00.  {[facility] HC Outreach:28874}  Do you have any problems getting your medications? {yes/no:20286} If yes what types of problems are you experiencing? {Problems:27223}  What is your top health concern you would like to discuss at your upcoming visit?   Have you seen any other providers since your last visit with PCP? No  Star Rating Drugs:  Medication:  Last Fill: Day Supply None noted  Care Gaps: Annual wellness visit in last year? Yes 08/06/2022  If Diabetic: Last eye exam / retinopathy screening: Overdue Last diabetic foot exam: Up to date  Al Corpus, PharmD notified  Claudina Lick, [place] Clinical Pharmacy Assistant [phone]

## 2022-11-20 ENCOUNTER — Ambulatory Visit: Payer: Medicare PPO | Admitting: Pharmacist

## 2022-11-20 ENCOUNTER — Telehealth: Payer: Self-pay | Admitting: Pharmacist

## 2022-11-20 DIAGNOSIS — E11649 Type 2 diabetes mellitus with hypoglycemia without coma: Secondary | ICD-10-CM

## 2022-11-20 MED ORDER — GVOKE HYPOPEN 2-PACK 1 MG/0.2ML ~~LOC~~ SOAJ: SUBCUTANEOUS | 1 refills | Status: AC

## 2022-11-20 NOTE — Telephone Encounter (Signed)
Thanks, Mardella Layman. We have discussed correct administration of her NovoLog numerous times.  I am willing to approve the prescription for glucagon treatment.  This patient need teaching for use of this device?

## 2022-11-20 NOTE — Telephone Encounter (Signed)
Spoke with patient about frequent hypoglycemia - she states her glucose drops < 70 almost every day. She reports last night she had a prolonged episode where glcuose was in 53s and would not come up for over an hour despite repeated treatments with juice and candy.   Discussed hypoglycemia prevention - advised to always take Novolog before meals (she has a habit of taking it up to 1 hr after meals because she wants to wait until she sees glucose spike first). Also discussed Novolog sliding scale 10-18 units - advised to err on side of caution and use lower amounts, 10-14 units, unless glucose is > 180 prior to meal.   Also discussed having a glucagon treatment (Gvoke) available for emergencies, pt would like to have one. Routing to PCP for input/approval.

## 2022-11-20 NOTE — Telephone Encounter (Signed)
Yes I think training is necessary. I also think it would be helpful for her to come in to download her Josephine Igo 3 reader, maybe seeing the report will help her see how she is taking her insulin is causing her sugar to drop. I will set up appt with me to do both in the next week or so!

## 2022-11-20 NOTE — Telephone Encounter (Signed)
Thanks, Rx for [employer] sent to pharmacy.

## 2022-11-20 NOTE — Progress Notes (Signed)
Care Management & Coordination Services Pharmacy Note  11/20/2022 Name:  Sarah Payne MRN:  [id number] DOB:  1945-10-19  Summary: F/U visit -DM: A1c 7.8% (10/2022); pt is taking Lantus 10 units HS and Novolog 16-18 units with meals (sliding scale prescribed is 10-18 units); she reports low glucose < 70 almost every day; she reports she sometimes will delay Novolog until ~1 hr after eating  -HTN/CKD: Pt is not checking BP at home, she reports BP at appt last week was 130s/70s. Of note enalapril was stopped earlier this year in s/o AKI. -HLD/CAD: LDL 32 (08/2022) while on rosuvastatin, which was stopped 08/2022 due to elevated LFTs  Recommendations/Changes made from today's visit: -Advised to take Novolog BEFORE meals only; err toward 10-14 unit range if glucose is < 180 before meals -Recommend Gvoke for PRN use for severe hypoglycemia (BG < 55) -Advised to monitor BP at home; contact provider if persistently > 130/80 -Recommend to recheck lipid panel at next OV  Follow up plan: -Pharmacist follow up OV scheduled 5/6    Subjective: Sarah Payne is an 77 y.o. year old female who is a primary patient of Doreene Nest, NP.  The care coordination team was consulted for assistance with disease management and care coordination needs.    Engaged with patient by telephone for follow up visit.  Recent office visits: 10/31/22 NP Mayra Reel OV: A1c 7.8%; Increase Lantus to 14 units, hopefully avoid needing 2 doses of Novolog. F/u 4 months.   10/28/22 Dr Milinda Antis OV: urinary frequency, flank pain - UA clear, labs WNL except for slightly low potassium  09/19/22 NP Mayra Reel OV: hospital f/u - start Lantus 10 units. D/C amitriptyline, trazodone (not taking). Reduce gabapentin to 300 mg BID.  09/02/22 NP Mayra Reel OV: dizziness - likely vertigo. Advised to call ENT. DM - Novolog 16-14-16. Continue glipizide XL 10 mg.  Referred to ED d/t AKI and dehydration.  07/31/22 NP Mayra Reel OV: f/u DM - A1c 8.4%;  she stopped Lantus. She is using glipizide (not on med list); stop glipizide. Novolog 18-14-16  04/16/22 NP Mayra Reel OV: f/u - A1c 8.7%; add Lantus 8 units. Novolog 05-08-15  02/18/22 NP Mayra Reel OV: Annual - declines mammogram, colonoscopy. Referred to neurology for neuropathy. Agrees to DEXA.  01/14/22 NP Mayra Reel OV:  f/u - A1c 7.8%; d/c Lantus. Novolog 20-15-20  10/11/21 NP Mayra Reel OV: f/u depression - rx Mirtazapine 7.5 mg daily HS. Resume Lantus 10 units dialy. Novolog 16 units at lunch.   Recent consult visits: 09/24/22 Dr Tobi Bastos (GI): elevated LFTs - await [place] results. Full lab panel. Order EGD. Increase omeprazole to 40 mg BID.   08/28/22 PA Dunn (Cardiology): f/u - LDL 32. Ordered ECHO, ABI, Carotid US. Defer SGLT2-I or ARNI d/t hx orthostasis.  08/06/22 Dr Cherylann Ratel (Pain mgmt): f/u - update gabapentin 600 mg BID.  02/17/22 PA Dunn (Cardiology): f/u - update gabapentin 400 mg BID. Levothyroxine 100 mcg daily.   01/30/22 Dr Cherylann Ratel (Pain Mgmt): MSK pain - update gabapentin 600 mg BID  Hospital visits: 10/06/22 planned admission [facility]): EGD  09/11/22 ED visit [facility]): acute pancreatitis. Rx norco, zofran. Found L kidney stoney.  09/03/22 ED visit [facility]): AKI, dehydration. Gave IV fluids. Elevated LFT. Hold enalapril, rosuvastatin, glipizide. Lantus removed from list (not taking). Ordered Anoro.   Objective:  Lab Results  Component Value Date   CREATININE 1.25 (H) 10/28/2022   BUN 19 10/28/2022   GFR 42.42 (L) 10/28/2022  EGFR 35 (L) 09/24/2022   GFRNONAA 38 (L) 09/11/2022   GFRAA 55 (L) 08/30/2020   NA 137 10/28/2022   K 3.4 (L) 10/28/2022   CALCIUM 9.3 10/28/2022   CO2 29 10/28/2022   GLUCOSE 148 (H) 10/28/2022    Lab Results  Component Value Date/Time   HGBA1C 7.8 (A) 10/31/2022 10:45 AM   HGBA1C 8.4 (A) 07/31/2022 11:20 AM   HGBA1C 8.2 (H) 10/02/2021 03:11 PM   HGBA1C 8.1 (H) 07/18/2021 02:43 PM   HGBA1C 10.9 (H) 08/02/2014 04:08 AM   HGBA1C 7.3 (H) 08/19/2013  12:19 PM   GFR 42.42 (L) 10/28/2022 11:27 AM   GFR 26.69 (L) 09/19/2022 10:31 AM   MICROALBUR 24.9 (H) 07/31/2022 11:49 AM   MICROALBUR 1.2 05/30/2013 04:48 PM    Last diabetic Eye exam:  Lab Results  Component Value Date/Time   HMDIABEYEEXA No Retinopathy 10/10/2020 12:00 AM   HMDIABEYEEXA No Retinopathy 10/10/2020 12:00 AM    Last diabetic Foot exam:  Lab Results  Component Value Date/Time   HMDIABFOOTEX abnormal 05/30/2013 12:00 AM     Lab Results  Component Value Date   CHOL 117 08/28/2022   HDL 42 08/28/2022   LDLCALC 32 08/28/2022   LDLDIRECT 52 08/28/2022   TRIG 213 (H) 08/28/2022   CHOLHDL 2.8 08/28/2022       Latest Ref Rng & Units 10/28/2022   11:27 AM 09/24/2022    2:21 PM 09/19/2022   10:31 AM  Hepatic Function  Total Protein 6.0 - 8.3 g/dL 6.7  6.5  6.8   Albumin 3.5 - 5.2 g/dL 4.2  4.2  4.0   AST 0 - 37 U/L 14  94  538   ALT 0 - 35 U/L 9  235  371   Alk Phosphatase 39 - 117 U/L 86  134  106   Total Bilirubin 0.2 - 1.2 mg/dL 0.4  <1.6  0.4   Bilirubin, Direct 0.0 - 0.3 mg/dL 0.1       Lab Results  Component Value Date/Time   TSH 3.46 02/18/2022 10:39 AM   TSH 1.92 01/14/2022 11:21 AM   FREET4 2.92 (H) 10/02/2021 09:28 AM   FREET4 1.34 01/04/2021 12:11 PM       Latest Ref Rng & Units 10/28/2022   11:27 AM 09/11/2022   11:08 AM 09/04/2022    7:30 AM  CBC  WBC 4.0 - 10.5 K/uL 6.8  9.1  7.3   Hemoglobin 12.0 - 15.0 g/dL 10.9  60.4  54.0   Hematocrit 36.0 - 46.0 % 40.8  44.6  38.7   Platelets 150.0 - 400.0 K/uL 240.0  271  192     Lab Results  Component Value Date/Time   VD25OH 41 02/25/2013 11:37 AM   VITAMINB12 542 10/13/2011 04:24 PM    Clinical ASCVD: Yes  The ASCVD Risk score (Arnett DK, et al., 2019) failed to calculate for the following reasons:   The patient has a prior MI or stroke diagnosis        10/31/2022   10:41 AM 10/28/2022   10:54 AM 09/19/2022    9:50 AM  Depression screen PHQ 2/9  Decreased Interest 2 3 3   Down, Depressed,  Hopeless 2 2 3   PHQ - 2 Score 4 5 6   Altered sleeping 2 3 0  Tired, decreased energy 3 3 3   Change in appetite 3 3 3   Feeling bad or failure about yourself  1 0 0  Trouble concentrating 1 2 3  Moving slowly or fidgety/restless 0 0 0  Suicidal thoughts 1 0 1  PHQ-9 Score 15 16 16   Difficult doing work/chores Very difficult Very difficult Very difficult       10/31/2022   10:41 AM 10/28/2022   10:54 AM 09/19/2022    9:51 AM 10/11/2021   11:11 AM  GAD 7 : Generalized Anxiety Score  Nervous, Anxious, on Edge 1 0 0 0  Control/stop worrying 0 0 0 3  Worry too much - different things 0 1 0 3  Trouble relaxing 1 3 0 3  Restless 1 0 0 3  Easily annoyed or irritable 1 0 2 3  Afraid - awful might happen 0 2 0 3  Total GAD 7 Score 4 6 2 18   Anxiety Difficulty Somewhat difficult Very difficult Very difficult     Social History   Tobacco Use  Smoking Status Every Day   Packs/day: 1.00   Years: 45.00   Additional pack years: 0.00   Total pack years: 45.00   Types: Cigarettes  Smokeless Tobacco Never  Tobacco Comments   Has cut back, trying to quit.    BP Readings from Last 3 Encounters:  10/31/22 (!) 162/88  10/28/22 (!) 150/82  10/06/22 111/69   Pulse Readings from Last 3 Encounters:  10/31/22 65  10/28/22 75  10/06/22 73   Wt Readings from Last 3 Encounters:  10/31/22 133 lb (60.3 kg)  10/28/22 132 lb 4 oz (60 kg)  10/06/22 133 lb 4.3 oz (60.5 kg)   BMI Readings from Last 3 Encounters:  10/31/22 21.47 kg/m  10/28/22 21.35 kg/m  10/06/22 21.51 kg/m    No Known Allergies  Medications Reviewed Today     Reviewed by Kathyrn Sheriff, [facility] (Pharmacist) on 11/20/22 at 1440  Med List Status: <None>   Medication Order Taking? Sig Documenting Provider Last Dose Status Informant  albuterol (VENTOLIN HFA) 108 (90 Base) MCG/ACT inhaler [id number] Yes Inhale 2 puffs into the lungs every 6 (six) hours as needed for wheezing or shortness of breath. Doreene Nest, NP  Taking Active Self  carvedilol (COREG) 3.125 MG tablet [id number] Yes Take 1 tablet (3.125 mg total) by mouth 2 (two) times daily. Sondra Barges, PA-C Taking Active Self  clobetasol cream (TEMOVATE) 0.05 % [id number] Yes Apply 1 Application topically 2 (two) times daily as needed. For vaginal itching. Doreene Nest, NP Taking Active Self  clopidogrel (PLAVIX) 75 MG tablet [id number] Yes Take 1 tablet (75 mg total) by mouth daily. Sondra Barges, PA-C Taking Active Self  Continuous Blood Gluc Receiver (FREESTYLE LIBRE 2 READER) DEVI [id number] Yes Use with sensor to check blood sugars 6 times daily. Doreene Nest, NP Taking Active Self  Continuous Blood Gluc Sensor (FREESTYLE LIBRE 2 SENSOR) MISC [id number] Yes USE 1 SENSOR TO CHECK BLOOD SUGAR 6 TIMES DAILY - REPLACE EVERY 14 DAYS Doreene Nest, NP Taking Active   ezetimibe (ZETIA) 10 MG tablet [id number] Yes Take 1 tablet (10 mg total) by mouth daily. For cholesterol. Marisue Ivan D, PA-C Taking Active Self  famotidine (PEPCID) 20 MG tablet [id number] Yes Take 1 tablet (20 mg total) by mouth at bedtime. For nausea and acid Doreene Nest, NP Taking Active   furosemide (LASIX) 20 MG tablet [id number] Yes Take 1 tablet (20 mg total) by mouth as needed (As needed for weight gain greater than 3 pounds overnight or increased shortness of breath.). Take one tab only on Monday, Wednesday, & Fridays Shea Evans,  Raymon Mutton, PA-C Taking Active Self  gabapentin (NEURONTIN) 300 MG capsule [id number] Yes Take 1 capsule (300 mg total) by mouth 2 (two) times daily. For pain Doreene Nest, NP Taking Active   glucose blood (FREESTYLE LITE) test strip [id number] Yes Test up to four times a day as needed. DX Doreene Nest, NP Taking Active Self  HYDROcodone-acetaminophen (NORCO/VICODIN) 5-325 MG tablet [id number] Yes Take 1 tablet by mouth every 4 (four) hours as needed. Minna Antis, MD Taking Active   insulin aspart (NOVOLOG FLEXPEN) 100 UNIT/ML FlexPen  [id number] Yes INJECT 10 UNITS INTO THE SKIN WITH BREAKFAST, 18 UNITS WITH LUNCH, AND 16 UNITS WITH DINNER FOR DIABETES Doreene Nest, NP Taking Active   insulin glargine (LANTUS SOLOSTAR) 100 UNIT/ML Solostar Pen [id number] Yes Inject 14 Units into the skin daily. for diabetes. Doreene Nest, NP Taking Active   Insulin Pen Needle (INSUPEN PEN NEEDLES) 32G X 4 MM MISC [id number] Yes Use to inject insulin 4 times daily. Doreene Nest, NP Taking Active Self  levothyroxine (SYNTHROID) 100 MCG tablet [id number] Yes Take 100 mcg by mouth daily before breakfast. [provider] Taking Active Self  mirtazapine (REMERON) 7.5 MG tablet [id number] Yes Take 1 tablet (7.5 mg total) by mouth at bedtime. For appetite and sleep. Doreene Nest, NP Taking Active Self  omeprazole (PRILOSEC) 40 MG capsule [id number] Yes Take 1 capsule (40 mg total) by mouth daily. Wyline Mood, MD Taking Active   ondansetron [facility]) 4 MG tablet [id number] Yes Take 1 tablet (4 mg total) by mouth every 6 (six) hours as needed for nausea. Tower, Audrie Gallus, MD Taking Active   ondansetron (ZOFRAN-ODT) 4 MG disintegrating tablet [id number] Yes Take 1 tablet (4 mg total) by mouth every 8 (eight) hours as needed for nausea or vomiting. Minna Antis, MD Taking Active   potassium chloride (KLOR-CON M) 10 MEQ tablet [id number] Yes Only take on days you take your lasix. Take 1 tablet (10 mEq total) by mouth for 1 dose on those days you take your lasix 20mg  pill. Sondra Barges, PA-C Taking Active Self  umeclidinium-vilanterol [facility] ELLIPTA) 62.5-25 MCG/ACT AEPB [id number] Yes Inhale 1 puff into the lungs daily. Alford Highland, MD Taking Active   Med List Note Valerie Salts, RN 07/09/21 1059): 02-11-18 Message sent to Dr. Mariah Milling in Epic for permission to stop Plavix 7 days. DW Mr 01-05-2022            SDOH:  (Social Determinants of Health) assessments and interventions performed: No SDOH Interventions    Flowsheet Row  Care Coordination from 11/04/2022 in [facility] Care Coordination Office Visit from 10/31/2022 in [facility] [place] [facility] Visit from 09/19/2022 in [facility] [place] [facility] Clinical Support from 08/06/2022 in [facility] [place] [facility] Office Visit from 07/31/2022 in [facility] [place] [facility] Office Visit from 02/18/2022 in [facility] [place] [facility] [place]  SDOH Interventions        Food Insecurity Interventions Intervention Not Indicated -- -- Intervention Not Indicated -- --  Housing Interventions Intervention Not Indicated -- -- Intervention Not Indicated -- --  Transportation Interventions Intervention Not Indicated -- -- Intervention Not Indicated -- --  Utilities Interventions -- -- -- Intervention Not Indicated -- --  Alcohol Usage Interventions -- -- -- Intervention Not Indicated (Score <7) -- --  Depression Interventions/Treatment  -- Counseling Counseling Medication, Counseling Medication Counseling  Financial Strain  Interventions -- -- -- Intervention Not Indicated -- --  Physical Activity Interventions -- -- -- Patient Refused -- --  Stress Interventions -- -- -- Intervention Not Indicated -- --  Social Connections Interventions -- -- -- Intervention Not Indicated -- --       Medication Assistance: None required.  Patient affirms current coverage meets needs.  Medication Access: Within the past 30 days, how often has patient missed a dose of medication? 0 Is a pillbox or other method used to improve adherence? Yes  Factors that may affect medication adherence? nonadherence to medications and lack of understanding of disease management Are meds synced by current pharmacy? No  Are meds delivered by current pharmacy? Yes  Does patient experience delays in picking up medications due to transportation concerns? No   Upstream Services Reviewed: Is patient  disadvantaged to use [facility] Pharmacy?: Yes  Current Rx insurance plan: [employer] Name and location of Current pharmacy:  [facility] [address], [place] - [address] [place] [address] Phone: [phone] Fax: [phone]  [facility] - [place], [place] - [address] [place] [phone] Phone: [phone] Fax: [phone]  [facility] - [place], [place] - [address] [facility] [address] [place] [address] Phone: [phone] Fax: [phone]  [facility] Pharmacy services reviewed with patient today?: No  Patient requests to transfer care to [facility] Pharmacy?: No  Reason patient declined to change pharmacies: Disadvantaged due to insurance/mail order  Compliance/Adherence/Medication fill history: Care Gaps: Eye exam (due 09/2021)  Star-Rating Drugs: None   Assessment/Plan  Hypertension / Heart Failure (BP goal <130/80) -Query controlled - enalapril was discontinued recently in s/o AKI, pt has not been checking BP at home -Current home BP readings: 136/70 at appt last week -Current home weights: none available -Last EF 55-60% (09/2021), previously 25-30% in 2017 -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%) -NYHA Class: I-II; AHA HF Stage: C (Heart disease and symptoms present) -Hx orthostasis -Current treatment: Carvedilol 3.125mg  BID - Appropriate, Effective, Safe, Accessible Furosemide 20 mg 3x weekly - Appropriate, Effective, Safe, Accessible -Medications previously tried: enalapril, spironolactone (AKI) -Educated on Importance of blood pressure control -Counseled to monitor BP at home daily; contact provider if consistently > 130/80 -Recommended to continue current medication  Hyperlipidemia / CAD (LDL goal < 70) -Query Controlled - LDL 32 (08/2022) while on rosuvastatin, which was stopped 08/2022 due to elevated LFTs -Hx NSTEMI 2016; Hx PAD -Current treatment: Ezetimibe  10 mg daily - Appropriate, Effective, Safe, Accessible Clopidogrel 75 mg daily - Appropriate, Effective, Safe, Accessible -Medications previously tried: rosuvastatin (elevated LFT 08/2022) -Recommended to continue current medication; recheck lipids at next OV  Diabetes (A1c goal <7%) -Uncontrolled, improving - A1c 7.8% (10/2022); improved from 8.4 (07/2022), was >10% previosly; pt has long history of noncompliance with insulin regimen and continues to take Novolog per sliding scale with frequent hypoglycemia episodes -Currently patient is using Lantus 10 units and night and Novolog 16-18 units with meals; sometimes she will wait to take Novolog until ~1 hr after meal when she notices glucose is higher -Current home BG readings: Freestyle Libre  Today: Novolog 18 unit this morning with coffee around 7:30 am; 11:00 BG was 118 before lunch, gave 16 units, Ate salad for lunch -Current medications: Novolog 10-18 TID w/ meals - Appropriate, Effective, Query safe Lantus Solostar 10 units daily - Appropriate, Effective, Safe, Accessible Freestyle Libre 2 (reader) -  -Medications previously tried:  glipizide, Januvia, metformin (diarrhea), Trulicity (cost) -Would avoid GLP-1 RA given appetite/wt loss issues; SGLT2-I deferred by cardiology d/t hx orthostasis -For safety patient may benefit from glucagon injection to have on hand for episodes of severe hypoglycemia, discussed with patient and she would like to have one -Advised to always take Novolog before/with a meal (not 1 hr after a meal); given hypoglycemia advised to err towards lower end of SSI (10-14 units) when glucose is < 150 -Recommend Gvoke as needed for severe hypoglycemia (<55) unresponsive to oral glucose treatment  Depression/Anxiety (Goal: manage symptoms) -Stable - pt reports feeling better lately -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 for past ~4 years after his heart attack; he can walk but is mostly bed/chair-bound; she reports significant  depressed mood, hopelessness, poor appetite, poor sleep; her 2 children do occasionally help which she is grateful for; she has been unable to use home health nurses/aids because her husband has PTSD and is unable to tolerate strangers in his home -She typically goes to bed at 10-11 pm, and wakes at 3-4 AM -PHQ9: 15 (10/2022) - moderately severe depression -Connected with PCP for mental health support -Current treatment: Mirtazapine 7.5 mg daily HS - Appropriate, Effective, Safe, Accessible -Medications previously tried/failed: duloxetine, escitalopram, trazodone, amitriptyline -Previously discussed caregiver stress/burnout, her situation is complicated by her husband's PTSD so only family/friends can help her -Recommend to continue current medication  COPD (Goal: control symptoms and prevent exacerbations) -Controlled - pt reports improvement since starting Anoro; she has not needed rescue inhaler in months -Gold Grade: Gold 2 (FEV1 50-79%) -Current COPD Classification:  A (low sx, 0-1 moderate exacerbations, no hospitalizations) -Pulmonary function testing: 03/29/2013 simple spirometry- ratio 65%; FEV1 1.54L (60% pred)  -Exacerbations requiring treatment in last 6 months: 0 -Current treatment  Anoro Ellipta 1 puff daily - Appropriate, Effective, Safe, Accessible Albuterol HFA prn - not using -Medications previously tried: n/a  -Patient reports consistent use of maintenance inhaler -Frequency of rescue inhaler use: rare -Counseled on Benefits of consistent maintenance inhaler use -Recommended to continue current medication  Neuropathy (Goal: manage symptoms) -Controlled - pt reports dizziness is improved with lower dose of gabapentin -Current treatment  Gabapentin 300 mg BID - Appropriate, Effective, Safe, Accessible -Medications previously tried: n/a  -Recommended to continue current medication  GERD (Goal: minimize symptoms of reflux ) -Controlled - follows with GI; undergoing workup  for elevated LFTs, which are improving;  -Current treatment  Omeprazole 40 mg BID - Appropriate, Effective, Safe, Accessible Famotidine 20 mg PRN -Appropriate, Effective, Safe, Accessible Ondansetron 4 mg -Appropriate, Effective, Safe, Accessible -Medications previously tried: n/a -Counseled on small meals, elevating head, and sleeping on left side -Recommended to continue current medication  Chronic Kidney Disease Stage 3b  -All medications assessed for renal dosing and appropriateness in chronic kidney disease. 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

## 2022-11-24 ENCOUNTER — Ambulatory Visit: Payer: Medicare PPO | Admitting: Pharmacist

## 2022-11-24 NOTE — Patient Instructions (Signed)
Visit Information  Phone number for Pharmacist: [phone]  Thank you for meeting with me to discuss your medications! Below is a summary of what we talked about during the visit:   Blood sugar before meal Novolog dose  Less than 130 None  130-160 10 units  160-190 12 units  190-220 14 units  220-250 16 units  250-280 18 units  280-310 20 units   If blood sugar increases to >350 after a meal, WITHIN 4 HOURS of your last dose of insulin, you make up to 5 additional units.   Sarah Payne, PharmD, BCACP Clinical Pharmacist [facility] [phone]

## 2022-11-24 NOTE — Progress Notes (Signed)
Care Management & Coordination Services Pharmacy Note  11/24/2022 Name:  Sarah Payne MRN:  [id number] DOB:  [birth year]  Summary: F/U visit -DM: Pt brought Libre 2 reader for download. Discussed hypoglycemia at length. Yesterday pt took 16 units of Novolog before lunch, glucose went to 370 so she took an additional 14 units within 2 hours, and glucose dropped to 45, and it took a while to come back up. Discussed dangers of insulin stacking. Demonstrated how to use GVOKE Hypopen. -Reviewed AGP report: 11/11/22 to 11/24/22. Sensor active: 90%  Time in range (70-180): 52% (goal > 70%)  High (180-250): 31%  Very high (>250): 11%  Low (< 70): 6% (goal < 4%)  GMI: 7.3%; Average glucose: 168   Recommendations/Changes made from today's visit: -Advised to use GVOKE for severe hypoglycemic emergency, BG < 55 or unresponsive to treatment. Advised she show family/friends how to use it on her as well -Provided Novolog sliding scale. Advised to avoid insulin stacking (Novolog repeat dose within 4 hours) Blood sugar before meal Novolog dose  Less than 130 None  130-160 10 units  160-190 12 units  190-220 14 units  220-250 16 units  250-280 18 units  280-310 20 units    Follow up plan: -Pharmacist follow up OV scheduled 12/08/22 -PCP appt 03/03/23   Subjective: Sarah Payne is an 77 y.o. year old female who is a primary patient of Doreene Nest, NP.  The care coordination team was consulted for assistance with disease management and care coordination needs.    Engaged with patient by telephone for follow up visit.  Recent office visits: 10/31/22 NP Mayra Reel OV: A1c 7.8%; Increase Lantus to 14 units, hopefully avoid needing 2 doses of Novolog. F/u 4 months.   10/28/22 Dr Milinda Antis OV: urinary frequency, flank pain - UA clear, labs WNL except for slightly low potassium  09/19/22 NP Mayra Reel OV: hospital f/u - start Lantus 10 units. D/C amitriptyline, trazodone (not taking). Reduce gabapentin to  300 mg BID.  09/02/22 NP Mayra Reel OV: dizziness - likely vertigo. Advised to call ENT. DM - Novolog 16-14-16. Continue glipizide XL 10 mg.  Referred to ED d/t AKI and dehydration.  07/31/22 NP Mayra Reel OV: f/u DM - A1c 8.4%; she stopped Lantus. She is using glipizide (not on med list); stop glipizide. Novolog 18-14-16  04/16/22 NP Mayra Reel OV: f/u - A1c 8.7%; add Lantus 8 units. Novolog 05-08-15  02/18/22 NP Mayra Reel OV: Annual - declines mammogram, colonoscopy. Referred to neurology for neuropathy. Agrees to DEXA.  01/14/22 NP Mayra Reel OV:  f/u - A1c 7.8%; d/c Lantus. Novolog 20-15-20  10/11/21 NP Mayra Reel OV: f/u depression - rx Mirtazapine 7.5 mg daily HS. Resume Lantus 10 units dialy. Novolog 16 units at lunch.   Recent consult visits: 09/24/22 Dr Tobi Bastos (GI): elevated LFTs - await [place] results. Full lab panel. Order EGD. Increase omeprazole to 40 mg BID.   08/28/22 PA Dunn (Cardiology): f/u - LDL 32. Ordered ECHO, ABI, Carotid US. Defer SGLT2-I or ARNI d/t hx orthostasis.  08/06/22 Dr Cherylann Ratel (Pain mgmt): f/u - update gabapentin 600 mg BID.  02/17/22 PA Dunn (Cardiology): f/u - update gabapentin 400 mg BID. Levothyroxine 100 mcg daily.   01/30/22 Dr Cherylann Ratel (Pain Mgmt): MSK pain - update gabapentin 600 mg BID  Hospital visits: 10/06/22 planned admission [facility]): EGD  09/11/22 ED visit [facility]): acute pancreatitis. Rx norco, zofran. Found L kidney stoney.  09/03/22 ED visit [facility]): AKI, dehydration. Gave  IV fluids. Elevated LFT. Hold enalapril, rosuvastatin, glipizide. Lantus removed from list (not taking). Ordered Anoro.   Objective:  Lab Results  Component Value Date   CREATININE 1.25 (H) 10/28/2022   BUN 19 10/28/2022   GFR 42.42 (L) 10/28/2022   EGFR 35 (L) 09/24/2022   GFRNONAA 38 (L) 09/11/2022   GFRAA 55 (L) 08/30/2020   NA 137 10/28/2022   K 3.4 (L) 10/28/2022   CALCIUM 9.3 10/28/2022   CO2 29 10/28/2022   GLUCOSE 148 (H) 10/28/2022    Lab Results  Component Value  Date/Time   HGBA1C 7.8 (A) 10/31/2022 10:45 AM   HGBA1C 8.4 (A) 07/31/2022 11:20 AM   HGBA1C 8.2 (H) 10/02/2021 03:11 PM   HGBA1C 8.1 (H) 07/18/2021 02:43 PM   HGBA1C 10.9 (H) 08/02/2014 04:08 AM   HGBA1C 7.3 (H) 08/19/2013 12:19 PM   GFR 42.42 (L) 10/28/2022 11:27 AM   GFR 26.69 (L) 09/19/2022 10:31 AM   MICROALBUR 24.9 (H) 07/31/2022 11:49 AM   MICROALBUR 1.2 05/30/2013 04:48 PM    Last diabetic Eye exam:  Lab Results  Component Value Date/Time   HMDIABEYEEXA No Retinopathy 10/10/2020 12:00 AM   HMDIABEYEEXA No Retinopathy 10/10/2020 12:00 AM    Last diabetic Foot exam:  Lab Results  Component Value Date/Time   HMDIABFOOTEX abnormal 05/30/2013 12:00 AM     Lab Results  Component Value Date   CHOL 117 08/28/2022   HDL 42 08/28/2022   LDLCALC 32 08/28/2022   LDLDIRECT 52 08/28/2022   TRIG 213 (H) 08/28/2022   CHOLHDL 2.8 08/28/2022       Latest Ref Rng & Units 10/28/2022   11:27 AM 09/24/2022    2:21 PM 09/19/2022   10:31 AM  Hepatic Function  Total Protein 6.0 - 8.3 g/dL 6.7  6.5  6.8   Albumin 3.5 - 5.2 g/dL 4.2  4.2  4.0   AST 0 - 37 U/L 14  94  538   ALT 0 - 35 U/L 9  235  371   Alk Phosphatase 39 - 117 U/L 86  134  106   Total Bilirubin 0.2 - 1.2 mg/dL 0.4  <1.6  0.4   Bilirubin, Direct 0.0 - 0.3 mg/dL 0.1       Lab Results  Component Value Date/Time   TSH 3.46 02/18/2022 10:39 AM   TSH 1.92 01/14/2022 11:21 AM   FREET4 2.92 (H) 10/02/2021 09:28 AM   FREET4 1.34 01/04/2021 12:11 PM       Latest Ref Rng & Units 10/28/2022   11:27 AM 09/11/2022   11:08 AM 09/04/2022    7:30 AM  CBC  WBC 4.0 - 10.5 K/uL 6.8  9.1  7.3   Hemoglobin 12.0 - 15.0 g/dL 10.9  60.4  54.0   Hematocrit 36.0 - 46.0 % 40.8  44.6  38.7   Platelets 150.0 - 400.0 K/uL 240.0  271  192     Lab Results  Component Value Date/Time   VD25OH 41 02/25/2013 11:37 AM   VITAMINB12 542 10/13/2011 04:24 PM    Clinical ASCVD: Yes  The ASCVD Risk score (Arnett DK, et al., 2019) failed to  calculate for the following reasons:   The patient has a prior MI or stroke diagnosis        10/31/2022   10:41 AM 10/28/2022   10:54 AM 09/19/2022    9:50 AM  Depression screen PHQ 2/9  Decreased Interest 2 3 3   Down, Depressed, Hopeless 2 2 3  PHQ - 2 Score 4 5 6   Altered sleeping 2 3 0  Tired, decreased energy 3 3 3   Change in appetite 3 3 3   Feeling bad or failure about yourself  1 0 0  Trouble concentrating 1 2 3   Moving slowly or fidgety/restless 0 0 0  Suicidal thoughts 1 0 1  PHQ-9 Score 15 16 16   Difficult doing work/chores Very difficult Very difficult Very difficult       10/31/2022   10:41 AM 10/28/2022   10:54 AM 09/19/2022    9:51 AM 10/11/2021   11:11 AM  GAD 7 : Generalized Anxiety Score  Nervous, Anxious, on Edge 1 0 0 0  Control/stop worrying 0 0 0 3  Worry too much - different things 0 1 0 3  Trouble relaxing 1 3 0 3  Restless 1 0 0 3  Easily annoyed or irritable 1 0 2 3  Afraid - awful might happen 0 2 0 3  Total GAD 7 Score 4 6 2 18   Anxiety Difficulty Somewhat difficult Very difficult Very difficult     Social History   Tobacco Use  Smoking Status Every Day   Packs/day: 1.00   Years: 45.00   Additional pack years: 0.00   Total pack years: 45.00   Types: Cigarettes  Smokeless Tobacco Never  Tobacco Comments   Has cut back, trying to quit.    BP Readings from Last 3 Encounters:  10/31/22 (!) 162/88  10/28/22 (!) 150/82  10/06/22 111/69   Pulse Readings from Last 3 Encounters:  10/31/22 65  10/28/22 75  10/06/22 73   Wt Readings from Last 3 Encounters:  10/31/22 133 lb (60.3 kg)  10/28/22 132 lb 4 oz (60 kg)  10/06/22 133 lb 4.3 oz (60.5 kg)   BMI Readings from Last 3 Encounters:  10/31/22 21.47 kg/m  10/28/22 21.35 kg/m  10/06/22 21.51 kg/m    No Known Allergies  Medications Reviewed Today     Reviewed by Kathyrn Sheriff, [facility] (Pharmacist) on 11/20/22 at 1440  Med List Status: <None>   Medication Order Taking? Sig  Documenting Provider Last Dose Status Informant  albuterol (VENTOLIN HFA) 108 (90 Base) MCG/ACT inhaler [id number] Yes Inhale 2 puffs into the lungs every 6 (six) hours as needed for wheezing or shortness of breath. Doreene Nest, NP Taking Active Self  carvedilol (COREG) 3.125 MG tablet [id number] Yes Take 1 tablet (3.125 mg total) by mouth 2 (two) times daily. Sondra Barges, PA-C Taking Active Self  clobetasol cream (TEMOVATE) 0.05 % [id number] Yes Apply 1 Application topically 2 (two) times daily as needed. For vaginal itching. Doreene Nest, NP Taking Active Self  clopidogrel (PLAVIX) 75 MG tablet [id number] Yes Take 1 tablet (75 mg total) by mouth daily. Sondra Barges, PA-C Taking Active Self  Continuous Blood Gluc Receiver (FREESTYLE LIBRE 2 READER) DEVI [id number] Yes Use with sensor to check blood sugars 6 times daily. Doreene Nest, NP Taking Active Self  Continuous Blood Gluc Sensor (FREESTYLE LIBRE 2 SENSOR) MISC [id number] Yes USE 1 SENSOR TO CHECK BLOOD SUGAR 6 TIMES DAILY - REPLACE EVERY 14 DAYS Doreene Nest, NP Taking Active   ezetimibe (ZETIA) 10 MG tablet [id number] Yes Take 1 tablet (10 mg total) by mouth daily. For cholesterol. Marisue Ivan D, PA-C Taking Active Self  famotidine (PEPCID) 20 MG tablet [id number] Yes Take 1 tablet (20 mg total) by mouth at bedtime. For nausea and acid Doreene Nest, NP Taking  Active   furosemide (LASIX) 20 MG tablet [id number] Yes Take 1 tablet (20 mg total) by mouth as needed (As needed for weight gain greater than 3 pounds overnight or increased shortness of breath.). Take one tab only on Monday, Wednesday, & Fridays Sondra Barges, PA-C Taking Active Self  gabapentin (NEURONTIN) 300 MG capsule [id number] Yes Take 1 capsule (300 mg total) by mouth 2 (two) times daily. For pain Doreene Nest, NP Taking Active   glucose blood (FREESTYLE LITE) test strip [id number] Yes Test up to four times a day as needed. DX Doreene Nest, NP  Taking Active Self  HYDROcodone-acetaminophen (NORCO/VICODIN) 5-325 MG tablet [id number] Yes Take 1 tablet by mouth every 4 (four) hours as needed. Minna Antis, MD Taking Active   insulin aspart (NOVOLOG FLEXPEN) 100 UNIT/ML FlexPen [id number] Yes INJECT 10 UNITS INTO THE SKIN WITH BREAKFAST, 18 UNITS WITH LUNCH, AND 16 UNITS WITH DINNER FOR DIABETES Doreene Nest, NP Taking Active   insulin glargine (LANTUS SOLOSTAR) 100 UNIT/ML Solostar Pen [id number] Yes Inject 14 Units into the skin daily. for diabetes. Doreene Nest, NP Taking Active   Insulin Pen Needle (INSUPEN PEN NEEDLES) 32G X 4 MM MISC [id number] Yes Use to inject insulin 4 times daily. Doreene Nest, NP Taking Active Self  levothyroxine (SYNTHROID) 100 MCG tablet [id number] Yes Take 100 mcg by mouth daily before breakfast. [provider] Taking Active Self  mirtazapine (REMERON) 7.5 MG tablet [id number] Yes Take 1 tablet (7.5 mg total) by mouth at bedtime. For appetite and sleep. Doreene Nest, NP Taking Active Self  omeprazole (PRILOSEC) 40 MG capsule [id number] Yes Take 1 capsule (40 mg total) by mouth daily. Wyline Mood, MD Taking Active   ondansetron [facility]) 4 MG tablet [id number] Yes Take 1 tablet (4 mg total) by mouth every 6 (six) hours as needed for nausea. Tower, Audrie Gallus, MD Taking Active   ondansetron (ZOFRAN-ODT) 4 MG disintegrating tablet [id number] Yes Take 1 tablet (4 mg total) by mouth every 8 (eight) hours as needed for nausea or vomiting. Minna Antis, MD Taking Active   potassium chloride (KLOR-CON M) 10 MEQ tablet [id number] Yes Only take on days you take your lasix. Take 1 tablet (10 mEq total) by mouth for 1 dose on those days you take your lasix 20mg  pill. Sondra Barges, PA-C Taking Active Self  umeclidinium-vilanterol [facility] ELLIPTA) 62.5-25 MCG/ACT AEPB [id number] Yes Inhale 1 puff into the lungs daily. Alford Highland, MD Taking Active   Med List Note Valerie Salts, RN 07/09/21 1059):  02-11-18 Message sent to Dr. Mariah Milling in Epic for permission to stop Plavix 7 days. DW Mr 01-05-2022            SDOH:  (Social Determinants of Health) assessments and interventions performed: No SDOH Interventions    Flowsheet Row Care Coordination from 11/04/2022 in [facility] Care Coordination Office Visit from 10/31/2022 in [facility] Visit from 09/19/2022 in [facility] [place] [facility] Clinical Support from 08/06/2022 in [facility] [place] [facility] Office Visit from 07/31/2022 in [facility] Visit from 02/18/2022 in [facility] [place]  SDOH Interventions        Food Insecurity Interventions Intervention Not Indicated -- -- Intervention Not Indicated -- --  Housing Interventions Intervention Not Indicated -- -- Intervention Not Indicated -- --  Transportation Interventions Intervention Not Indicated -- --  Intervention Not Indicated -- --  Utilities Interventions -- -- -- Intervention Not Indicated -- --  Alcohol Usage Interventions -- -- -- Intervention Not Indicated (Score <7) -- --  Depression Interventions/Treatment  -- Counseling Counseling Medication, Counseling Medication Counseling  Financial Strain Interventions -- -- -- Intervention Not Indicated -- --  Physical Activity Interventions -- -- -- Patient Refused -- --  Stress Interventions -- -- -- Intervention Not Indicated -- --  Social Connections Interventions -- -- -- Intervention Not Indicated -- --       Medication Assistance: None required.  Patient affirms current coverage meets needs.  Medication Access: Within the past 30 days, how often has patient missed a dose of medication? 0 Is a pillbox or other method used to improve adherence? Yes  Factors that may affect medication adherence? nonadherence to medications and lack of understanding of disease  management Are meds synced by current pharmacy? No  Are meds delivered by current pharmacy? Yes  Does patient experience delays in picking up medications due to transportation concerns? No   Upstream Services Reviewed: Is patient disadvantaged to use [facility] Pharmacy?: Yes  Current Rx insurance plan: [employer] Name and location of Current pharmacy:  [facility] [address] [place] - [address] [place] [address] Phone: [phone] Fax: [phone]  [facility] - [place], [place] - [address] [place] [phone] Phone: [phone] Fax: [phone]  [facility] - [place], [place] - [address] [facility] [address] [place] [address] Phone: [phone] Fax: [phone]  [facility] Pharmacy services reviewed with patient today?: No  Patient requests to transfer care to [facility] Pharmacy?: No  Reason patient declined to change pharmacies: Disadvantaged due to insurance/mail order  Compliance/Adherence/Medication fill history: Care Gaps: Eye exam (due 09/2021)  Star-Rating Drugs: None   Assessment/Plan  Hypertension / Heart Failure (BP goal <130/80) -Query controlled - enalapril was discontinued recently in s/o AKI, pt has not been checking BP at home -Current home BP readings: 136/70 at appt last week -Current home weights: none available -Last EF 55-60% (09/2021), previously 25-30% in 2017 -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%) -NYHA Class: I-II; AHA HF Stage: C (Heart disease and symptoms present) -Hx orthostasis -Current treatment: Carvedilol 3.125mg  BID - Appropriate, Effective, Safe, Accessible Furosemide 20 mg 3x weekly - Appropriate, Effective, Safe, Accessible -Medications previously tried: enalapril, spironolactone (AKI) -Educated on Importance of blood pressure control -Counseled to monitor BP at home daily; contact provider if consistently > 130/80 -Recommended  to continue current medication  Hyperlipidemia / CAD (LDL goal < 70) -Query Controlled - LDL 32 (08/2022) while on rosuvastatin, which was stopped 08/2022 due to elevated LFTs -Hx NSTEMI 2016; Hx PAD -Current treatment: Ezetimibe 10 mg daily - Appropriate, Effective, Safe, Accessible Clopidogrel 75 mg daily - Appropriate, Effective, Safe, Accessible -Medications previously tried: rosuvastatin (elevated LFT 08/2022) -Recommended to continue current medication; recheck lipids at next OV  Diabetes (A1c goal <7%) -Uncontrolled, improving - A1c 7.8% (10/2022); improved from 8.4 (07/2022), was >10% previosly; pt has long history of noncompliance with insulin regimen and continues to take Novolog per sliding scale with frequent hypoglycemia episodes -Currently patient is using Lantus 10 units and night and Novolog 16-18 units with meals; sometimes she will wait to take Novolog until ~1 hr after meal when she notices glucose is higher -Current home BG readings: Freestyle Libre  Reviewed AGP report: 11/11/22 to 11/24/22. Sensor active: 90%  Time  in range (70-180): 52% (goal > 70%)  High (180-250): 31%  Very high (>250): 11%  Low (< 70): 6% (goal < 4%)  GMI: 7.3%; Average glucose: 168  -Current medications: Novolog 10-18 TID w/ meals - Appropriate, Effective, Query safe Lantus Solostar 10 units daily - Appropriate, Effective, Safe, Accessible Freestyle Libre 2 (reader) -  -Medications previously tried: glipizide, Januvia, metformin (diarrhea), Trulicity (cost) -Would avoid GLP-1 RA given appetite/wt loss issues; SGLT2-I deferred by cardiology d/t hx orthostasis -For safety patient may benefit from glucagon injection to have on hand for episodes of severe hypoglycemia, discussed with patient and she would like to have one -Provided Novolog slide scale. Counseled against insulin stacking (repeat dose within 4 hours). 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 for past ~4 years after his heart attack; he can walk but is mostly bed/chair-bound; she reports significant depressed mood, hopelessness, poor appetite, poor sleep; her 2 children do occasionally help which she is grateful for; she has been unable to use home health nurses/aids because her husband has PTSD and is unable to tolerate strangers in his home -She typically goes to bed at 10-11 pm, and wakes at 3-4 AM -PHQ9: 15 (10/2022) - moderately severe depression -Connected with PCP for mental health support -Current treatment: Mirtazapine 7.5 mg daily HS - Appropriate, Effective, Safe, Accessible -Medications previously tried/failed: duloxetine, escitalopram, trazodone, amitriptyline -Previously discussed caregiver stress/burnout, her situation is complicated by her husband's PTSD so only family/friends can help her -Recommend to continue current medication  COPD (Goal: control symptoms and prevent exacerbations) -Controlled - pt reports improvement since starting Anoro; she has not needed rescue inhaler in months -Gold Grade: Gold 2 (FEV1 50-79%) -Current COPD Classification:  A (low sx, 0-1 moderate exacerbations, no hospitalizations) -Pulmonary function testing: 03/29/2013 simple spirometry- ratio 65%; FEV1 1.54L (60% pred)  -Exacerbations requiring treatment in last 6 months: 0 -Current treatment  Anoro Ellipta 1 puff daily - Appropriate, Effective, Safe, Accessible Albuterol HFA prn - not using -Medications previously tried: n/a  -Patient reports consistent use of maintenance inhaler -Frequency of rescue inhaler use: rare -Counseled on Benefits of consistent maintenance inhaler use -Recommended to continue current medication  Neuropathy (Goal: manage symptoms) -Controlled - pt reports dizziness is improved with lower dose of  gabapentin -Current treatment  Gabapentin 300 mg BID - Appropriate, Effective, Safe, Accessible -Medications previously tried: n/a  -Recommended to continue current medication  GERD (Goal: minimize symptoms of reflux ) -Controlled - follows with GI; undergoing workup for elevated LFTs, which are improving;  -Current treatment  Omeprazole 40 mg BID - Appropriate, Effective, Safe, Accessible Famotidine 20 mg PRN -Appropriate, Effective, Safe, Accessible Ondansetron 4 mg -Appropriate, Effective, Safe, Accessible -Medications previously tried: n/a -Counseled on small meals, elevating head, and sleeping on left side -Recommended to continue current medication  Chronic Kidney Disease Stage 3b  -All medications assessed for renal dosing and appropriateness in chronic kidney disease. 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

## 2022-11-26 ENCOUNTER — Ambulatory Visit: Payer: Self-pay

## 2022-11-26 NOTE — Patient Outreach (Signed)
  Care Coordination   Follow Up Visit Note   11/26/2022 Name: Sarah Payne MRN: [id number] DOB: 04-02-1946  Sarah Payne is a 77 y.o. year old female who sees Doreene Nest, NP for primary care. I spoke with  [name] by phone today.  What matters to the patients health and wellness today?  Patient reports today's fasting blood sugar was 154.  She states she has a low blood sugar  ,70 at least everyday.  Patient reports practice pharmacist is working with her on adjusting her diabetic medication and she will follow up with her in 2 weeks.  Patient denies any heart failure symptoms.   Patient states she is not monitoring her blood pressure because she is unable to find the monitor.  She declined offered assistance with smoking cessation.  Patient reports being under stress due to managing the care of her husband. She states her husband has several medical conditions and has severe pain. She states patient has [employer] coverage. Patient declined social work referral for caregiver stress. She states she is unable to get assistance with the [facility] for spouse because he does not want anyone else in the home or taking care of him but her. She reports having 3 children, one living local and the other 3 living in [place]. She states she does not receive much help from them.     Goals Addressed             This Visit's Progress    Management and education regarding health conditions       Interventions Today    Flowsheet Row Most Recent Value  Chronic Disease   Chronic disease during today's visit Diabetes, Hypertension (HTN), Congestive Heart Failure (CHF)  General Interventions   General Interventions Discussed/Reviewed General Interventions Reviewed, Doctor Visits, Labs  [place] of treatment plan for DM, HTN, HF and patients adherence to plan as established by provider. Assessed for blood sugar / BP readings. Assessed for HF symptoms.]  Labs Hgb A1c every 6 months  [discussed most recent   A1c and liver funtion test results.]  Doctor Visits Discussed/Reviewed Doctor Visits Reviewed  [name] scheduled provider appointments.]  Education Interventions   Education Provided Provided Education  Provided Verbal Education On Other, When to see the doctor  [discussed importance of monitoring blood pressures. Advised to notify provider if consistent blood pressures >130/80.  Assessed for home blood pressure monitor. Provider patient contact number for [employer] to use OTC benefit for BP monitor purchase.]  Mental Health Interventions   Mental Health Discussed/Reviewed Mental Health Reviewed, Coping Strategies, Other  [discussed / offered SW referral due to caregiver stress. Discussed smoking cessation and offered counseling follow up.]  Pharmacy Interventions   Pharmacy Dicussed/Reviewed Pharmacy Topics Reviewed  [medications reviewed and compliance discussed.]              SDOH assessments and interventions completed:  No     Care Coordination Interventions:  Yes, provided   Follow up plan: Follow up call scheduled for 12/29/22    Encounter Outcome:  Pt. Visit Completed   [name] RN,BSN,CCM [facility] Care Coordination [phone] direct line

## 2022-11-26 NOTE — Patient Instructions (Signed)
Visit Information  Thank you for taking time to visit with me today. Please don't hesitate to contact me if I can be of assistance to you.   Following are the goals we discussed today:   Goals Addressed             This Visit's Progress    Management and education regarding health conditions       Interventions Today    Flowsheet Row Most Recent Value  Chronic Disease   Chronic disease during today's visit Diabetes, Hypertension (HTN), Congestive Heart Failure (CHF)  General Interventions   General Interventions Discussed/Reviewed General Interventions Reviewed, Doctor Visits, Labs  [place] of treatment plan for DM, HTN, HF and patients adherence to plan as established by provider. Assessed for blood sugar / BP readings. Assessed for HF symptoms.]  Labs Hgb A1c every 6 months  [discussed most recent  A1c and liver funtion test results.]  Doctor Visits Discussed/Reviewed Doctor Visits Reviewed  [reviewed scheduled provider appointments.]  Education Interventions   Education Provided Provided Education  [discussed hypoglycemic management. Education article sent to patient on managing hypoglycemia.]  Provided Verbal Education On Other, When to see the doctor  [discussed importance of monitoring blood pressures. Advised to notify provider if consistent blood pressures >130/80.  Assessed for home blood pressure monitor. Provider patient contact number for [employer] to use OTC benefit for BP monitor purchase.]  Mental Health Interventions   Mental Health Discussed/Reviewed Mental Health Reviewed, Coping Strategies, Other  [discussed / offered SW referral due to caregiver stress. Discussed smoking cessation and offered counseling follow up.]  Pharmacy Interventions   Pharmacy Dicussed/Reviewed Pharmacy Topics Reviewed  [medications reviewed and compliance discussed. Encouraged consistent use on maintenance inhalers.]              Our next appointment is by telephone on 12/29/22 at 11  am  Please call the care guide team at [phone] if you need to cancel or reschedule your appointment.   If you are experiencing a Mental Health or Behavioral Health Crisis or need someone to talk to, please call the Suicide and Crisis Lifeline: 988 call 1-800-273-TALK (toll free, 24 hour hotline)  Patient verbalizes understanding of instructions and care plan provided today and agrees to view in MyChart. Active MyChart status and patient understanding of how to access instructions and care plan via MyChart confirmed with patient.     [name] RN,BSN,CCM [facility] Care Coordination [phone] direct line   Hypoglycemia Hypoglycemia is when the sugar (glucose) level in your blood is too low. Low blood sugar can happen to people who have diabetes and people who do not have diabetes. Low blood sugar can happen quickly, and it can be an emergency. What are the causes? This condition happens most often in people who have diabetes. It may be caused by: Diabetes medicine. Not eating enough, or not eating often enough. Doing more physical activity. Drinking alcohol on an empty stomach. If you do not have diabetes, this condition may be caused by: A tumor in the pancreas. Not eating enough, or not eating for long periods at a time (fasting). A very bad infection or illness. Problems after having weight loss (bariatric) surgery. Kidney failure or liver failure. Certain medicines. What increases the risk? This condition is more likely to develop in people who: Have diabetes and take medicines to lower their blood sugar. Abuse alcohol. Have a very bad illness. What are the signs or symptoms? Mild Hunger. Sweating and feeling clammy. Feeling dizzy or  light-headed. Being sleepy or having trouble sleeping. Feeling like you may vomit (nauseous). A fast heartbeat. A headache. Blurry vision. Mood changes, such as: Being grouchy. Feeling worried or nervous (anxious). Tingling or loss  of feeling (numbness) around your mouth, lips, or tongue. Moderate Confusion and poor judgment. Behavior changes. Weakness. Uneven heartbeat. Trouble with moving (coordination). Very low Very low blood sugar (severe hypoglycemia) is a medical emergency. It can cause: Fainting. Seizures. Loss of consciousness (coma). Death. How is this treated? Treating low blood sugar Low blood sugar is often treated by eating or drinking something that has sugar in it right away. The food or drink should contain 15 grams of a fast-acting carb (carbohydrate). Options include: 4 oz (120 mL) of fruit juice. 4 oz (120 mL) of regular soda (not diet soda). A few pieces of hard candy. Check food labels to see how many pieces to eat for 15 grams. 1 Tbsp (15 mL) of sugar or honey. 4 glucose tablets. 1 tube of glucose gel. Treating low blood sugar if you have diabetes If you can think clearly and swallow safely, follow the 15:15 rule: Take 15 grams of a fast-acting carb. Talk with your doctor about how much you should take. Always keep a source of fast-acting carb with you, such as: Glucose tablets (take 4 tablets). A few pieces of hard candy. Check food labels to see how many pieces to eat for 15 grams. 4 oz (120 mL) of fruit juice. 4 oz (120 mL) of regular soda (not diet soda). 1 Tbsp (15 mL) of honey or sugar. 1 tube of glucose gel. Check your blood sugar 15 minutes after you take the carb. If your blood sugar is still at or below 70 mg/dL (3.9 mmol/L), take 15 grams of a carb again. If your blood sugar does not go above 70 mg/dL (3.9 mmol/L) after 3 tries, get help right away. After your blood sugar goes back to normal, eat a meal or a snack within 1 hour.  Treating very low blood sugar If your blood sugar is below 54 mg/dL (3 mmol/L), you have very low blood sugar, or severe hypoglycemia. This is an emergency. Get medical help right away. If you have very low blood sugar and you cannot eat or  drink, you will need to be given a hormone called glucagon. A family member or friend should learn how to check your blood sugar and how to give you glucagon. Ask your doctor if you need to have an emergency glucagon kit at home. Very low blood sugar may also need to be treated in a hospital. Follow these instructions at home: General instructions Take over-the-counter and prescription medicines only as told by your doctor. Stay aware of your blood sugar as told by your doctor. If you drink alcohol: Limit how much you have to: 0-1 drink a day for women who are not pregnant. 0-2 drinks a day for men. Know how much alcohol is in your drink. In the [place] one drink equals one 12 oz bottle of beer (355 mL), one 5 oz glass of wine (148 mL), or one 1 oz glass of hard liquor (44 mL). Be sure to eat food when you drink alcohol. Know that your body absorbs alcohol quickly. This may lead to low blood sugar later. Be sure to keep checking your blood sugar. Keep all follow-up visits. If you have diabetes:  Always have a fast-acting carb (15 grams) with you to treat low blood sugar. Follow your diabetes care  plan as told by your doctor. Make sure you: Know the symptoms of low blood sugar. Check your blood sugar as often as told. Always check it before and after exercise. Always check your blood sugar before you drive. Take your medicines as told. Follow your meal plan. Eat on time. Do not skip meals. Share your diabetes care plan with: Your work or school. People you live with. Carry a card or wear jewelry that says you have diabetes. Where to find more information American Diabetes Association: www.diabetes.org Contact a doctor if: You have trouble keeping your blood sugar in your target range. You have low blood sugar often. Get help right away if: You still have symptoms after you eat or drink something that contains 15 grams of fast-acting carb, and you cannot get your blood sugar above 70  mg/dL by following the 16:10 rule. Your blood sugar is below 54 mg/dL (3 mmol/L). You have a seizure. You faint. These symptoms may be an emergency. Get help right away. Call your local emergency services (911 in the [place] Do not wait to see if the symptoms will go away. Do not drive yourself to the hospital. Summary Hypoglycemia happens when the level of sugar (glucose) in your blood is too low. Low blood sugar can happen to people who have diabetes and people who do not have diabetes. Low blood sugar can happen quickly, and it can be an emergency. Make sure you know the symptoms of low blood sugar and know how to treat it. Always keep a source of sugar (fast-acting carb) with you to treat low blood sugar. This information is not intended to replace advice given to you by your health care provider. Make sure you discuss any questions you have with your health care provider. Document Revised: 06/07/2020 Document Reviewed: 06/07/2020 Elsevier Patient Education  2023 [employer].

## 2022-12-03 ENCOUNTER — Telehealth: Payer: Self-pay

## 2022-12-03 NOTE — Progress Notes (Signed)
Care Management & Coordination Services Pharmacy Team  Reason for Encounter: Appointment Reminder  Contacted patient to confirm in office appointment with Al Corpus, PharmD on 12/08/2022 at 9:00.  {US HC Outreach:28874}  Do you have any problems getting your medications? {yes/no:20286} If yes what types of problems are you experiencing? {Problems:27223}  What is your top health concern you would like to discuss at your upcoming visit?   Have you seen any other providers since your last visit with PCP? No  Star Rating Drugs:  Medication:                Last Fill:         Day Supply None noted   Care Gaps: Annual wellness visit in last year? Yes 08/06/2022   If Diabetic: Last eye exam / retinopathy screening: Up to date Last diabetic foot exam: Up to date   Al Corpus, PharmD notified   Claudina Lick, [place] Clinical Pharmacy Assistant [phone]

## 2022-12-08 ENCOUNTER — Ambulatory Visit: Payer: Medicare PPO | Admitting: Pharmacist

## 2022-12-08 NOTE — Patient Instructions (Addendum)
Visit Information  Phone number for Pharmacist: [phone]  Thank you for meeting with me to discuss your medications! Below is a summary of what we talked about during the visit:   Let's reduce your sliding scale as below:  Blood sugar before meal Novolog dose  Less than 130 None  130-160 6 units  160-190 8 units  190-220 10 units  220-250 12 units  250-280 14 units  280-310 16 units   Try to snack on something (peanut butter crackers, pretzels) if you skip a meal. Try Glucerna shakes (test out other flavors)  Al Corpus, PharmD, BCACP Clinical Pharmacist [facility] [phone]

## 2022-12-08 NOTE — Progress Notes (Signed)
Care Management & Coordination Services Pharmacy Note  12/08/2022 Name:  Sarah Payne MRN:  [id number] DOB:  July 31, 1945  Summary: F/U visit -DM: Pt brought Libre 2 reader for download. Pt has been using Novolog sliding scale 10-20 units as directed and skips Novolog if she doesn't eat (which is often). She has stopped insulin stacking. Low glucose has improved but still occurs after she takes Novolog (14-16 units) Reviewed AGP report: 11/25/22 to 12/08/22. Sensor active: 93%  Time in range (70-180): 59% (improved from 52%)  High (180-250): 31%  Very high (>250): 8% (improved from 11%)  Low (< 70): 2% (improved from 6%)  GMI: 7.4%; Average glucose: 170   Recommendations/Changes made from today's visit: -Advised to at least snack if she is going to skip a meal (peanut butter crackers, Glucerna shake) -Reduce Novolog sliding scale to 6-16 units (reduced by 4 units) Blood sugar before meal Novolog dose  Less than 130 None  130-160 6 units  160-190 8 units  190-220 10 units  220-250 12 units  250-280 14 units  280-310 16 units   Follow up plan: -Pharmacist follow up OV scheduled 12/22/22 -PCP appt 03/03/23   Subjective: Sarah Payne is an 77 y.o. year old female who is a primary patient of Doreene Nest, NP.  The care coordination team was consulted for assistance with disease management and care coordination needs.    Engaged with patient by telephone for follow up visit.  Recent office visits: 10/31/22 NP Mayra Reel OV: A1c 7.8%; Increase Lantus to 14 units, hopefully avoid needing 2 doses of Novolog. F/u 4 months.   10/28/22 Dr Milinda Antis OV: urinary frequency, flank pain - UA clear, labs WNL except for slightly low potassium  09/19/22 NP Mayra Reel OV: hospital f/u - start Lantus 10 units. D/C amitriptyline, trazodone (not taking). Reduce gabapentin to 300 mg BID.  09/02/22 NP Mayra Reel OV: dizziness - likely vertigo. Advised to call ENT. DM - Novolog 16-14-16. Continue glipizide XL  10 mg.  Referred to ED d/t AKI and dehydration.  07/31/22 NP Mayra Reel OV: f/u DM - A1c 8.4%; she stopped Lantus. She is using glipizide (not on med list); stop glipizide. Novolog 18-14-16  04/16/22 NP Mayra Reel OV: f/u - A1c 8.7%; add Lantus 8 units. Novolog 05-08-15  02/18/22 NP Mayra Reel OV: Annual - declines mammogram, colonoscopy. Referred to neurology for neuropathy. Agrees to DEXA.  01/14/22 NP Mayra Reel OV:  f/u - A1c 7.8%; d/c Lantus. Novolog 20-15-20  10/11/21 NP Mayra Reel OV: f/u depression - rx Mirtazapine 7.5 mg daily HS. Resume Lantus 10 units dialy. Novolog 16 units at lunch.   Recent consult visits: 09/24/22 Dr Tobi Bastos (GI): elevated LFTs - await [place] results. Full lab panel. Order EGD. Increase omeprazole to 40 mg BID.   08/28/22 PA Dunn (Cardiology): f/u - LDL 32. Ordered ECHO, ABI, Carotid US. Defer SGLT2-I or ARNI d/t hx orthostasis.  08/06/22 Dr Cherylann Ratel (Pain mgmt): f/u - update gabapentin 600 mg BID.  02/17/22 PA Dunn (Cardiology): f/u - update gabapentin 400 mg BID. Levothyroxine 100 mcg daily.   01/30/22 Dr Cherylann Ratel (Pain Mgmt): MSK pain - update gabapentin 600 mg BID  Hospital visits: 10/06/22 planned admission [facility]): EGD  09/11/22 ED visit [facility]): acute pancreatitis. Rx norco, zofran. Found L kidney stoney.  09/03/22 ED visit [facility]): AKI, dehydration. Gave IV fluids. Elevated LFT. Hold enalapril, rosuvastatin, glipizide. Lantus removed from list (not taking). Ordered Anoro.   Objective:  Lab Results  Component  Value Date   CREATININE 1.25 (H) 10/28/2022   BUN 19 10/28/2022   GFR 42.42 (L) 10/28/2022   EGFR 35 (L) 09/24/2022   GFRNONAA 38 (L) 09/11/2022   GFRAA 55 (L) 08/30/2020   NA 137 10/28/2022   K 3.4 (L) 10/28/2022   CALCIUM 9.3 10/28/2022   CO2 29 10/28/2022   GLUCOSE 148 (H) 10/28/2022    Lab Results  Component Value Date/Time   HGBA1C 7.8 (A) 10/31/2022 10:45 AM   HGBA1C 8.4 (A) 07/31/2022 11:20 AM   HGBA1C 8.2 (H) 10/02/2021 03:11 PM   HGBA1C 8.1  (H) 07/18/2021 02:43 PM   HGBA1C 10.9 (H) 08/02/2014 04:08 AM   HGBA1C 7.3 (H) 08/19/2013 12:19 PM   GFR 42.42 (L) 10/28/2022 11:27 AM   GFR 26.69 (L) 09/19/2022 10:31 AM   MICROALBUR 24.9 (H) 07/31/2022 11:49 AM   MICROALBUR 1.2 05/30/2013 04:48 PM    Last diabetic Eye exam:  Lab Results  Component Value Date/Time   HMDIABEYEEXA No Retinopathy 10/10/2020 12:00 AM   HMDIABEYEEXA No Retinopathy 10/10/2020 12:00 AM    Last diabetic Foot exam:  Lab Results  Component Value Date/Time   HMDIABFOOTEX abnormal 05/30/2013 12:00 AM     Lab Results  Component Value Date   CHOL 117 08/28/2022   HDL 42 08/28/2022   LDLCALC 32 08/28/2022   LDLDIRECT 52 08/28/2022   TRIG 213 (H) 08/28/2022   CHOLHDL 2.8 08/28/2022       Latest Ref Rng & Units 10/28/2022   11:27 AM 09/24/2022    2:21 PM 09/19/2022   10:31 AM  Hepatic Function  Total Protein 6.0 - 8.3 g/dL 6.7  6.5  6.8   Albumin 3.5 - 5.2 g/dL 4.2  4.2  4.0   AST 0 - 37 U/L 14  94  538   ALT 0 - 35 U/L 9  235  371   Alk Phosphatase 39 - 117 U/L 86  134  106   Total Bilirubin 0.2 - 1.2 mg/dL 0.4  <1.1  0.4   Bilirubin, Direct 0.0 - 0.3 mg/dL 0.1       Lab Results  Component Value Date/Time   TSH 3.46 02/18/2022 10:39 AM   TSH 1.92 01/14/2022 11:21 AM   FREET4 2.92 (H) 10/02/2021 09:28 AM   FREET4 1.34 01/04/2021 12:11 PM       Latest Ref Rng & Units 10/28/2022   11:27 AM 09/11/2022   11:08 AM 09/04/2022    7:30 AM  CBC  WBC 4.0 - 10.5 K/uL 6.8  9.1  7.3   Hemoglobin 12.0 - 15.0 g/dL 91.4  78.2  95.6   Hematocrit 36.0 - 46.0 % 40.8  44.6  38.7   Platelets 150.0 - 400.0 K/uL 240.0  271  192     Lab Results  Component Value Date/Time   VD25OH 41 02/25/2013 11:37 AM   VITAMINB12 542 10/13/2011 04:24 PM    Clinical ASCVD: Yes  The ASCVD Risk score (Arnett DK, et al., 2019) failed to calculate for the following reasons:   The patient has a prior MI or stroke diagnosis        10/31/2022   10:41 AM 10/28/2022   10:54 AM  09/19/2022    9:50 AM  Depression screen PHQ 2/9  Decreased Interest 2 3 3   Down, Depressed, Hopeless 2 2 3   PHQ - 2 Score 4 5 6   Altered sleeping 2 3 0  Tired, decreased energy 3 3 3   Change in appetite  3 3 3   Feeling bad or failure about yourself  1 0 0  Trouble concentrating 1 2 3   Moving slowly or fidgety/restless 0 0 0  Suicidal thoughts 1 0 1  PHQ-9 Score 15 16 16   Difficult doing work/chores Very difficult Very difficult Very difficult       10/31/2022   10:41 AM 10/28/2022   10:54 AM 09/19/2022    9:51 AM 10/11/2021   11:11 AM  GAD 7 : Generalized Anxiety Score  Nervous, Anxious, on Edge 1 0 0 0  Control/stop worrying 0 0 0 3  Worry too much - different things 0 1 0 3  Trouble relaxing 1 3 0 3  Restless 1 0 0 3  Easily annoyed or irritable 1 0 2 3  Afraid - awful might happen 0 2 0 3  Total GAD 7 Score 4 6 2 18   Anxiety Difficulty Somewhat difficult Very difficult Very difficult     Social History   Tobacco Use  Smoking Status Every Day   Packs/day: 1.00   Years: 45.00   Additional pack years: 0.00   Total pack years: 45.00   Types: Cigarettes  Smokeless Tobacco Never  Tobacco Comments   Has cut back, trying to quit.    BP Readings from Last 3 Encounters:  10/31/22 (!) 162/88  10/28/22 (!) 150/82  10/06/22 111/69   Pulse Readings from Last 3 Encounters:  10/31/22 65  10/28/22 75  10/06/22 73   Wt Readings from Last 3 Encounters:  10/31/22 133 lb (60.3 kg)  10/28/22 132 lb 4 oz (60 kg)  10/06/22 133 lb 4.3 oz (60.5 kg)   BMI Readings from Last 3 Encounters:  10/31/22 21.47 kg/m  10/28/22 21.35 kg/m  10/06/22 21.51 kg/m    No Known Allergies  Medications Reviewed Today     Reviewed by Kathyrn Sheriff, [facility] (Pharmacist) on 12/08/22 at [phone]  Med List Status: <None>   Medication Order Taking? Sig Documenting Provider Last Dose Status Informant  albuterol (VENTOLIN HFA) 108 (90 Base) MCG/ACT inhaler [id number] Yes Inhale 2 puffs into the  lungs every 6 (six) hours as needed for wheezing or shortness of breath. Doreene Nest, NP Taking Active Self  carvedilol (COREG) 3.125 MG tablet [id number] Yes Take 1 tablet (3.125 mg total) by mouth 2 (two) times daily. Sondra Barges, PA-C Taking Active Self  clobetasol cream (TEMOVATE) 0.05 % [id number] Yes Apply 1 Application topically 2 (two) times daily as needed. For vaginal itching. Doreene Nest, NP Taking Active Self  clopidogrel (PLAVIX) 75 MG tablet [id number] Yes Take 1 tablet (75 mg total) by mouth daily. Sondra Barges, PA-C Taking Active Self  Continuous Blood Gluc Receiver (FREESTYLE LIBRE 2 READER) DEVI [id number] Yes Use with sensor to check blood sugars 6 times daily. Doreene Nest, NP Taking Active Self  Continuous Blood Gluc Sensor (FREESTYLE LIBRE 2 SENSOR) MISC [id number] Yes USE 1 SENSOR TO CHECK BLOOD SUGAR 6 TIMES DAILY - REPLACE EVERY 14 DAYS Doreene Nest, NP Taking Active   ezetimibe (ZETIA) 10 MG tablet [id number] Yes Take 1 tablet (10 mg total) by mouth daily. For cholesterol. Marisue Ivan D, PA-C Taking Active Self  famotidine (PEPCID) 20 MG tablet [id number] Yes Take 1 tablet (20 mg total) by mouth at bedtime. For nausea and acid Doreene Nest, NP Taking Active   furosemide (LASIX) 20 MG tablet [id number] Yes Take 1 tablet (20 mg total) by mouth as needed (As needed for weight  gain greater than 3 pounds overnight or increased shortness of breath.). Take one tab only on Monday, Wednesday, & Fridays Sondra Barges, PA-C Taking Active Self  gabapentin (NEURONTIN) 300 MG capsule [id number] Yes Take 1 capsule (300 mg total) by mouth 2 (two) times daily. For pain Doreene Nest, NP Taking Active   Glucagon (GVOKE HYPOPEN 2-PACK) 1 MG/0.2ML Ivory Broad [id number] Yes Inject 1 mg (1 pen) as needed for severe low blood sugar (sustained glucose less than 55 despite oral glucose treatments). May repeat in 15 minutes as needed. Doreene Nest, NP Taking Active    glucose blood (FREESTYLE LITE) test strip [id number] Yes Test up to four times a day as needed. DX Doreene Nest, NP Taking Active Self  HYDROcodone-acetaminophen (NORCO/VICODIN) 5-325 MG tablet [id number] Yes Take 1 tablet by mouth every 4 (four) hours as needed. Minna Antis, MD Taking Active   insulin aspart (NOVOLOG FLEXPEN) 100 UNIT/ML FlexPen [id number] Yes INJECT 10 UNITS INTO THE SKIN WITH BREAKFAST, 18 UNITS WITH LUNCH, AND 16 UNITS WITH DINNER FOR DIABETES  Patient taking differently: 6-16 units per sliding scale   Doreene Nest, NP Taking Active   insulin glargine (LANTUS SOLOSTAR) 100 UNIT/ML Solostar Pen [id number] Yes Inject 14 Units into the skin daily. for diabetes.  Patient taking differently: Inject 10 Units into the skin daily. for diabetes.   Doreene Nest, NP Taking Active   Insulin Pen Needle (INSUPEN PEN NEEDLES) 32G X 4 MM MISC [id number] Yes Use to inject insulin 4 times daily. Doreene Nest, NP Taking Active Self  levothyroxine (SYNTHROID) 100 MCG tablet [id number] Yes Take 100 mcg by mouth daily before breakfast. [provider] Taking Active Self  mirtazapine (REMERON) 7.5 MG tablet [id number] Yes Take 1 tablet (7.5 mg total) by mouth at bedtime. For appetite and sleep. Doreene Nest, NP Taking Active Self  omeprazole (PRILOSEC) 40 MG capsule [id number] Yes Take 1 capsule (40 mg total) by mouth daily. Wyline Mood, MD Taking Active   ondansetron [facility]) 4 MG tablet [id number] Yes Take 1 tablet (4 mg total) by mouth every 6 (six) hours as needed for nausea. Tower, Audrie Gallus, MD Taking Active   ondansetron (ZOFRAN-ODT) 4 MG disintegrating tablet [id number] Yes Take 1 tablet (4 mg total) by mouth every 8 (eight) hours as needed for nausea or vomiting. Minna Antis, MD Taking Active   potassium chloride (KLOR-CON M) 10 MEQ tablet [id number] Yes Only take on days you take your lasix. Take 1 tablet (10 mEq total) by mouth for 1 dose on those days  you take your lasix 20mg  pill. Sondra Barges, PA-C Taking Active Self  umeclidinium-vilanterol [facility] ELLIPTA) 62.5-25 MCG/ACT AEPB [id number] Yes Inhale 1 puff into the lungs daily. Alford Highland, MD Taking Active   Med List Note Valerie Salts, RN 07/09/21 1059): 02-11-18 Message sent to Dr. Mariah Milling in Epic for permission to stop Plavix 7 days. DW Mr 01-05-2022            SDOH:  (Social Determinants of Health) assessments and interventions performed: No SDOH Interventions    Flowsheet Row Care Coordination from 11/04/2022 in [facility] Care Coordination Office Visit from 10/31/2022 in [facility] Visit from 09/19/2022 in [facility] [place] [facility] Clinical Support from 08/06/2022 in [facility] [place] [facility] Visit from 07/31/2022 in [facility] at [facility] Visit from 02/18/2022 in [facility] at [facility]  SDOH Interventions        Food Insecurity Interventions Intervention Not Indicated -- -- Intervention Not Indicated -- --  Housing Interventions Intervention Not Indicated -- -- Intervention Not Indicated -- --  Transportation Interventions Intervention Not Indicated -- -- Intervention Not Indicated -- --  Utilities Interventions -- -- -- Intervention Not Indicated -- --  Alcohol Usage Interventions -- -- -- Intervention Not Indicated (Score <7) -- --  Depression Interventions/Treatment  -- Counseling Counseling Medication, Counseling Medication Counseling  Financial Strain Interventions -- -- -- Intervention Not Indicated -- --  Physical Activity Interventions -- -- -- Patient Refused -- --  Stress Interventions -- -- -- Intervention Not Indicated -- --  Social Connections Interventions -- -- -- Intervention Not Indicated -- --       Medication Assistance: None required.  Patient affirms current coverage meets  needs.  Medication Access: Within the past 30 days, how often has patient missed a dose of medication? 0 Is a pillbox or other method used to improve adherence? Yes  Factors that may affect medication adherence? nonadherence to medications and lack of understanding of disease management Are meds synced by current pharmacy? No  Are meds delivered by current pharmacy? Yes  Does patient experience delays in picking up medications due to transportation concerns? No   Upstream Services Reviewed: Is patient disadvantaged to use [facility] Pharmacy?: Yes  Current Rx insurance plan: [employer] Name and location of Current pharmacy:  [facility] [address], [place] - [address] [place] [address] Phone: [phone] Fax: [phone]  [facility] - [place], [place] - [address] [place] [phone] Phone: [phone] Fax: [phone]  [facility] - [place], [place] - [address] [facility] [address] [place] [address] Phone: [phone] Fax: [phone]  [facility] Pharmacy services reviewed with patient today?: No  Patient requests to transfer care to [facility] Pharmacy?: No  Reason patient declined to change pharmacies: Disadvantaged due to insurance/mail order  Compliance/Adherence/Medication fill history: Care Gaps: Eye exam (due 09/2021)  Star-Rating Drugs: None   Assessment/Plan  Hypertension / Heart Failure (BP goal <130/80) -Query controlled - enalapril was discontinued recently in s/o AKI, pt has not been checking BP at home -Current home BP readings: 136/70 at appt last week -Current home weights: none available -Last EF 55-60% (09/2021), previously 25-30% in 2017 -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%) -NYHA Class: I-II; AHA HF Stage: C (Heart disease and symptoms present) -Hx orthostasis -Current treatment: Carvedilol 3.125mg  BID - Appropriate, Effective, Safe,  Accessible Furosemide 20 mg 3x weekly - Appropriate, Effective, Safe, Accessible -Medications previously tried: enalapril, spironolactone (AKI) -Educated on Importance of blood pressure control -Counseled to monitor BP at home daily; contact provider if consistently > 130/80 -Recommended to continue current medication  Hyperlipidemia / CAD (LDL goal < 70) -Query Controlled - LDL 32 (08/2022) while on rosuvastatin, which was stopped 08/2022 due to elevated LFTs -Hx NSTEMI 2016; Hx PAD -Current treatment: Ezetimibe 10 mg daily - Appropriate, Effective, Safe, Accessible Clopidogrel 75 mg daily - Appropriate, Effective, Safe, Accessible -Medications previously tried: rosuvastatin (elevated LFT 08/2022) -Recommended to continue current medication; recheck lipids at next OV  Diabetes (A1c goal <7%) -Uncontrolled, improving - A1c 7.8% (10/2022); improved from 8.4 (07/2022), was >10% previosly;  -Currently patient is using Lantus 10 units at night and Novolog 10-20 units per SS w/ meals; she has stopped insulin stacking; she  is still experiencing low glucose after taking Novolog or skipping meals; overnight hypoglycemia has improved significantly -Current home BG readings: Freestyle Libre  Reviewed AGP report: 11/25/22 to 12/08/22. Sensor active: 93%  Time in range (70-180): 59% (improved from 52%)  High (180-250): 31%  Very high (>250): 8% (improved from 11%)  Low (< 70): 2% (improved from 6%)  GMI: 7.4%; Average glucose: 170  Previous AGP report: 11/11/22 to 11/24/22. Sensor active: 90%  Time in range (70-180): 52% (goal > 70%)  High (180-250): 31%  Very high (>250): 11%  Low (< 70): 6% (goal < 4%)  GMI: 7.3%; Average glucose: 168  -Current medications: Novolog 10-20 units SSI w/ meals - Appropriate, Effective, Query safe Lantus Solostar 10 units daily - Appropriate, Effective, Safe, Accessible Gvoke Hypopen PRN - Appropriate, Effective, Safe, Accessible Freestyle Libre 2 (reader) -   -Medications previously tried: glipizide, Januvia, metformin (diarrhea), Trulicity (cost) -Would avoid GLP-1 RA given appetite/wt loss issues; SGLT2-I deferred by cardiology d/t hx orthostasis -Advised to have a snack (PB crackers, Glucerna shake) if she is going to skip a meal -Reduce Novolog sliding scale by 4 units (see summary above)  Depression/Anxiety (Goal: manage symptoms) -Stable - pt reports feeling better lately -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 for past ~4 years after his heart attack; he can walk but is mostly bed/chair-bound; she reports significant depressed mood, hopelessness, poor appetite, poor sleep; her 2 children do occasionally help which she is grateful for; she has been unable to use home health nurses/aids because her husband has PTSD and is unable to tolerate strangers in his home -She typically goes to bed at 10-11 pm, and wakes at 3-4 AM -PHQ9: 15 (10/2022) - moderately severe depression -Connected with PCP for mental health support -Current treatment: Mirtazapine 7.5 mg daily HS - Appropriate, Effective, Safe, Accessible -Medications previously tried/failed: duloxetine, escitalopram, trazodone, amitriptyline -Previously discussed caregiver stress/burnout, her situation is complicated by her husband's PTSD so only family/friends can help her -Recommend to continue current medication  COPD (Goal: control symptoms and prevent exacerbations) -Controlled - pt reports improvement since starting Anoro; she has not needed rescue inhaler in months -Gold Grade: Gold 2 (FEV1 50-79%) -Current COPD Classification:  A (low sx, 0-1 moderate exacerbations, no hospitalizations) -Pulmonary function testing: 03/29/2013 simple spirometry- ratio 65%; FEV1 1.54L (60% pred)  -Exacerbations requiring treatment in last 6 months: 0 -Current treatment  Anoro Ellipta 1 puff daily - Appropriate, Effective, Safe, Accessible Albuterol HFA prn - not using -Medications previously tried: n/a   -Patient reports consistent use of maintenance inhaler -Frequency of rescue inhaler use: rare -Counseled on Benefits of consistent maintenance inhaler use -Recommended to continue current medication  Neuropathy (Goal: manage symptoms) -Controlled - pt reports dizziness is improved with lower dose of gabapentin -Current treatment  Gabapentin 300 mg BID - Appropriate, Effective, Safe, Accessible -Medications previously tried: n/a  -Recommended to continue current medication  GERD (Goal: minimize symptoms of reflux ) -Controlled - follows with GI; undergoing workup for elevated LFTs, which are improving;  -Current treatment  Omeprazole 40 mg BID - Appropriate, Effective, Safe, Accessible Famotidine 20 mg PRN -Appropriate, Effective, Safe, Accessible Ondansetron 4 mg -Appropriate, Effective, Safe, Accessible -Medications previously tried: n/a -Counseled on small meals, elevating head, and sleeping on left side -Recommended to continue current medication  Chronic Kidney Disease Stage 3b  -All medications assessed for renal dosing and appropriateness in chronic kidney disease. 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

## 2022-12-17 ENCOUNTER — Telehealth: Payer: Self-pay

## 2022-12-17 NOTE — Progress Notes (Signed)
Care Management & Coordination Services Pharmacy Team  Reason for Encounter: Appointment Reminder  Contacted patient to confirm in office appointment with Al Corpus, PharmD on 12/22/2022 at 9:00.  Spoke with patient on 12/17/2022   Do you have any problems getting your medications? No  What is your top health concern you would like to discuss at your upcoming visit? Patient stated everything was going good right now  Have you seen any other providers since your last visit with PCP? No  Star Rating Drugs:  Medication:  Last Fill: Day Supply None noted  Care Gaps: Annual wellness visit in last year? Yes 08/06/2022  If Diabetic: Last eye exam / retinopathy screening: Up to date Last diabetic foot exam: Up to date  Al Corpus, PharmD notified  Claudina Lick, [place] Clinical Pharmacy Assistant [phone]

## 2022-12-22 ENCOUNTER — Ambulatory Visit: Payer: Medicare PPO | Admitting: Pharmacist

## 2022-12-22 NOTE — Patient Instructions (Signed)
Visit Information  Phone number for Pharmacist: [phone]  Thank you for meeting with me to discuss your medications! Below is a summary of what we talked about during the visit:   If you are eating a meal, use this sliding scale: Blood sugar before meal Novolog dose  Less than 130 None  130-160 6 units  160-190 8 units  190-220 10 units  220-250 12 units  250-280 14 units  280-310 16 units   If your sugar is over 250 and you are going to take Novolog, but you are NOT going to eat, take only 5 UNITS of Novolog. Do not do this more than once a day for now.  Al Corpus, PharmD, BCACP Clinical Pharmacist  Primary Care at [facility] [phone]

## 2022-12-22 NOTE — Progress Notes (Signed)
Care Management & Coordination Services Pharmacy Note  12/22/2022 Name:  Sarah Payne MRN:  [id number] DOB:  01-09-1946  Summary: F/U visit -DM: Pt brought Libre 2 reader for download. Currently patient is taking Lantus 10 units at night and Novolog 6-16 units per SS; she typically eats 1 meal per day 6-7 pm; she takes Novolog 2-3 times per day however, often without eating, to correct for high sugar > 200 Reviewed AGP report: 12/09/22 to 12/22/22. Sensor active: 94%  Time in range (70-180): 58% (goal > 70%)  High (180-250): 33%  Very high (>250): 7%  Low (< 70): 2% (goal < 4%)  GMI: 7.2%; Average glucose: 163  Recommendations/Changes made from today's visit: -Advised to Novolog use sliding scale (6-16 units) only when eating; if glucose is > 250 and she is not planning on eating she will take 5 units only for correction; limit correction dose to once per day for now  Follow up plan: -Pharmacist follow up OV scheduled 01/06/23 -PCP appt 03/03/23    Subjective: Sarah Payne is an 77 y.o. year old female who is a primary patient of Doreene Nest, NP.  The care coordination team was consulted for assistance with disease management and care coordination needs.    Engaged with patient by telephone for follow up visit.  Recent office visits: 10/31/22 NP Mayra Reel OV: A1c 7.8%; Increase Lantus to 14 units, hopefully avoid needing 2 doses of Novolog. F/u 4 months.   10/28/22 Dr Milinda Antis OV: urinary frequency, flank pain - UA clear, labs WNL except for slightly low potassium  09/19/22 NP Mayra Reel OV: hospital f/u - start Lantus 10 units. D/C amitriptyline, trazodone (not taking). Reduce gabapentin to 300 mg BID.  09/02/22 NP Mayra Reel OV: dizziness - likely vertigo. Advised to call ENT. DM - Novolog 16-14-16. Continue glipizide XL 10 mg.  Referred to ED d/t AKI and dehydration.  07/31/22 NP Mayra Reel OV: f/u DM - A1c 8.4%; she stopped Lantus. She is using glipizide (not on med list); stop  glipizide. Novolog 18-14-16  04/16/22 NP Mayra Reel OV: f/u - A1c 8.7%; add Lantus 8 units. Novolog 05-08-15  02/18/22 NP Mayra Reel OV: Annual - declines mammogram, colonoscopy. Referred to neurology for neuropathy. Agrees to DEXA.  01/14/22 NP Mayra Reel OV:  f/u - A1c 7.8%; d/c Lantus. Novolog 20-15-20  10/11/21 NP Mayra Reel OV: f/u depression - rx Mirtazapine 7.5 mg daily HS. Resume Lantus 10 units dialy. Novolog 16 units at lunch.   Recent consult visits: 09/24/22 Dr Tobi Bastos (GI): elevated LFTs - await [place] results. Full lab panel. Order EGD. Increase omeprazole to 40 mg BID.   08/28/22 PA Dunn (Cardiology): f/u - LDL 32. Ordered ECHO, ABI, Carotid US. Defer SGLT2-I or ARNI d/t hx orthostasis.  08/06/22 Dr Cherylann Ratel (Pain mgmt): f/u - update gabapentin 600 mg BID.  02/17/22 PA Dunn (Cardiology): f/u - update gabapentin 400 mg BID. Levothyroxine 100 mcg daily.   01/30/22 Dr Cherylann Ratel (Pain Mgmt): MSK pain - update gabapentin 600 mg BID  Hospital visits: 10/06/22 planned admission [facility]): EGD  09/11/22 ED visit [facility]): acute pancreatitis. Rx norco, zofran. Found L kidney stoney.  09/03/22 ED visit [facility]): AKI, dehydration. Gave IV fluids. Elevated LFT. Hold enalapril, rosuvastatin, glipizide. Lantus removed from list (not taking). Ordered Anoro.   Objective:  Lab Results  Component Value Date   CREATININE 1.25 (H) 10/28/2022   BUN 19 10/28/2022   GFR 42.42 (L) 10/28/2022   EGFR 35 (L) 09/24/2022  GFRNONAA 38 (L) 09/11/2022   GFRAA 55 (L) 08/30/2020   NA 137 10/28/2022   K 3.4 (L) 10/28/2022   CALCIUM 9.3 10/28/2022   CO2 29 10/28/2022   GLUCOSE 148 (H) 10/28/2022    Lab Results  Component Value Date/Time   HGBA1C 7.8 (A) 10/31/2022 10:45 AM   HGBA1C 8.4 (A) 07/31/2022 11:20 AM   HGBA1C 8.2 (H) 10/02/2021 03:11 PM   HGBA1C 8.1 (H) 07/18/2021 02:43 PM   HGBA1C 10.9 (H) 08/02/2014 04:08 AM   HGBA1C 7.3 (H) 08/19/2013 12:19 PM   GFR 42.42 (L) 10/28/2022 11:27 AM   GFR 26.69 (L)  09/19/2022 10:31 AM   MICROALBUR 24.9 (H) 07/31/2022 11:49 AM   MICROALBUR 1.2 05/30/2013 04:48 PM    Last diabetic Eye exam:  Lab Results  Component Value Date/Time   HMDIABEYEEXA No Retinopathy 10/10/2020 12:00 AM   HMDIABEYEEXA No Retinopathy 10/10/2020 12:00 AM    Last diabetic Foot exam:  Lab Results  Component Value Date/Time   HMDIABFOOTEX abnormal 05/30/2013 12:00 AM     Lab Results  Component Value Date   CHOL 117 08/28/2022   HDL 42 08/28/2022   LDLCALC 32 08/28/2022   LDLDIRECT 52 08/28/2022   TRIG 213 (H) 08/28/2022   CHOLHDL 2.8 08/28/2022       Latest Ref Rng & Units 10/28/2022   11:27 AM 09/24/2022    2:21 PM 09/19/2022   10:31 AM  Hepatic Function  Total Protein 6.0 - 8.3 g/dL 6.7  6.5  6.8   Albumin 3.5 - 5.2 g/dL 4.2  4.2  4.0   AST 0 - 37 U/L 14  94  538   ALT 0 - 35 U/L 9  235  371   Alk Phosphatase 39 - 117 U/L 86  134  106   Total Bilirubin 0.2 - 1.2 mg/dL 0.4  <6.5  0.4   Bilirubin, Direct 0.0 - 0.3 mg/dL 0.1       Lab Results  Component Value Date/Time   TSH 3.46 02/18/2022 10:39 AM   TSH 1.92 01/14/2022 11:21 AM   FREET4 2.92 (H) 10/02/2021 09:28 AM   FREET4 1.34 01/04/2021 12:11 PM       Latest Ref Rng & Units 10/28/2022   11:27 AM 09/11/2022   11:08 AM 09/04/2022    7:30 AM  CBC  WBC 4.0 - 10.5 K/uL 6.8  9.1  7.3   Hemoglobin 12.0 - 15.0 g/dL 78.4  69.6  29.5   Hematocrit 36.0 - 46.0 % 40.8  44.6  38.7   Platelets 150.0 - 400.0 K/uL 240.0  271  192     Lab Results  Component Value Date/Time   VD25OH 41 02/25/2013 11:37 AM   VITAMINB12 542 10/13/2011 04:24 PM    Clinical ASCVD: Yes  The ASCVD Risk score (Arnett DK, et al., 2019) failed to calculate for the following reasons:   The patient has a prior MI or stroke diagnosis        10/31/2022   10:41 AM 10/28/2022   10:54 AM 09/19/2022    9:50 AM  Depression screen PHQ 2/9  Decreased Interest 2 3 3   Down, Depressed, Hopeless 2 2 3   PHQ - 2 Score 4 5 6   Altered sleeping 2 3 0   Tired, decreased energy 3 3 3   Change in appetite 3 3 3   Feeling bad or failure about yourself  1 0 0  Trouble concentrating 1 2 3   Moving slowly or fidgety/restless 0 0  0  Suicidal thoughts 1 0 1  PHQ-9 Score 15 16 16   Difficult doing work/chores Very difficult Very difficult Very difficult       10/31/2022   10:41 AM 10/28/2022   10:54 AM 09/19/2022    9:51 AM 10/11/2021   11:11 AM  GAD 7 : Generalized Anxiety Score  Nervous, Anxious, on Edge 1 0 0 0  Control/stop worrying 0 0 0 3  Worry too much - different things 0 1 0 3  Trouble relaxing 1 3 0 3  Restless 1 0 0 3  Easily annoyed or irritable 1 0 2 3  Afraid - awful might happen 0 2 0 3  Total GAD 7 Score 4 6 2 18   Anxiety Difficulty Somewhat difficult Very difficult Very difficult     Social History   Tobacco Use  Smoking Status Every Day   Packs/day: 1.00   Years: 45.00   Additional pack years: 0.00   Total pack years: 45.00   Types: Cigarettes  Smokeless Tobacco Never  Tobacco Comments   Has cut back, trying to quit.    BP Readings from Last 3 Encounters:  10/31/22 (!) 162/88  10/28/22 (!) 150/82  10/06/22 111/69   Pulse Readings from Last 3 Encounters:  10/31/22 65  10/28/22 75  10/06/22 73   Wt Readings from Last 3 Encounters:  10/31/22 133 lb (60.3 kg)  10/28/22 132 lb 4 oz (60 kg)  10/06/22 133 lb 4.3 oz (60.5 kg)   BMI Readings from Last 3 Encounters:  10/31/22 21.47 kg/m  10/28/22 21.35 kg/m  10/06/22 21.51 kg/m    No Known Allergies  Medications Reviewed Today     Reviewed by Kathyrn Sheriff, [facility] (Pharmacist) on 12/08/22 at [phone]  Med List Status: <None>   Medication Order Taking? Sig Documenting Provider Last Dose Status Informant  albuterol (VENTOLIN HFA) 108 (90 Base) MCG/ACT inhaler [id number] Yes Inhale 2 puffs into the lungs every 6 (six) hours as needed for wheezing or shortness of breath. Doreene Nest, NP Taking Active Self  carvedilol (COREG) 3.125 MG tablet [id number]  Yes Take 1 tablet (3.125 mg total) by mouth 2 (two) times daily. Sondra Barges, PA-C Taking Active Self  clobetasol cream (TEMOVATE) 0.05 % [id number] Yes Apply 1 Application topically 2 (two) times daily as needed. For vaginal itching. Doreene Nest, NP Taking Active Self  clopidogrel (PLAVIX) 75 MG tablet [id number] Yes Take 1 tablet (75 mg total) by mouth daily. Sondra Barges, PA-C Taking Active Self  Continuous Blood Gluc Receiver (FREESTYLE LIBRE 2 READER) DEVI [id number] Yes Use with sensor to check blood sugars 6 times daily. Doreene Nest, NP Taking Active Self  Continuous Blood Gluc Sensor (FREESTYLE LIBRE 2 SENSOR) MISC [id number] Yes USE 1 SENSOR TO CHECK BLOOD SUGAR 6 TIMES DAILY - REPLACE EVERY 14 DAYS Doreene Nest, NP Taking Active   ezetimibe (ZETIA) 10 MG tablet [id number] Yes Take 1 tablet (10 mg total) by mouth daily. For cholesterol. Marisue Ivan D, PA-C Taking Active Self  famotidine (PEPCID) 20 MG tablet [id number] Yes Take 1 tablet (20 mg total) by mouth at bedtime. For nausea and acid Doreene Nest, NP Taking Active   furosemide (LASIX) 20 MG tablet [id number] Yes Take 1 tablet (20 mg total) by mouth as needed (As needed for weight gain greater than 3 pounds overnight or increased shortness of breath.). Take one tab only on Monday, Wednesday, & Fridays Sondra Barges, PA-C Taking Active Self  gabapentin (NEURONTIN) 300 MG capsule [id number] Yes Take 1 capsule (300 mg total) by mouth 2 (two) times daily. For pain Doreene Nest, NP Taking Active   Glucagon (GVOKE HYPOPEN 2-PACK) 1 MG/0.2ML Ivory Broad [id number] Yes Inject 1 mg (1 pen) as needed for severe low blood sugar (sustained glucose less than 55 despite oral glucose treatments). May repeat in 15 minutes as needed. Doreene Nest, NP Taking Active   glucose blood (FREESTYLE LITE) test strip [id number] Yes Test up to four times a day as needed. DX Doreene Nest, NP Taking Active Self  HYDROcodone-acetaminophen  (NORCO/VICODIN) 5-325 MG tablet [id number] Yes Take 1 tablet by mouth every 4 (four) hours as needed. Minna Antis, MD Taking Active   insulin aspart (NOVOLOG FLEXPEN) 100 UNIT/ML FlexPen [id number] Yes INJECT 10 UNITS INTO THE SKIN WITH BREAKFAST, 18 UNITS WITH LUNCH, AND 16 UNITS WITH DINNER FOR DIABETES  Patient taking differently: 6-16 units per sliding scale   Doreene Nest, NP Taking Active   insulin glargine (LANTUS SOLOSTAR) 100 UNIT/ML Solostar Pen [id number] Yes Inject 14 Units into the skin daily. for diabetes.  Patient taking differently: Inject 10 Units into the skin daily. for diabetes.   Doreene Nest, NP Taking Active   Insulin Pen Needle (INSUPEN PEN NEEDLES) 32G X 4 MM MISC [id number] Yes Use to inject insulin 4 times daily. Doreene Nest, NP Taking Active Self  levothyroxine (SYNTHROID) 100 MCG tablet [id number] Yes Take 100 mcg by mouth daily before breakfast. [provider] Taking Active Self  mirtazapine (REMERON) 7.5 MG tablet [id number] Yes Take 1 tablet (7.5 mg total) by mouth at bedtime. For appetite and sleep. Doreene Nest, NP Taking Active Self  omeprazole (PRILOSEC) 40 MG capsule [id number] Yes Take 1 capsule (40 mg total) by mouth daily. Wyline Mood, MD Taking Active   ondansetron [facility]) 4 MG tablet [id number] Yes Take 1 tablet (4 mg total) by mouth every 6 (six) hours as needed for nausea. Tower, Audrie Gallus, MD Taking Active   ondansetron (ZOFRAN-ODT) 4 MG disintegrating tablet [id number] Yes Take 1 tablet (4 mg total) by mouth every 8 (eight) hours as needed for nausea or vomiting. Minna Antis, MD Taking Active   potassium chloride (KLOR-CON M) 10 MEQ tablet [id number] Yes Only take on days you take your lasix. Take 1 tablet (10 mEq total) by mouth for 1 dose on those days you take your lasix 20mg  pill. Sondra Barges, PA-C Taking Active Self  umeclidinium-vilanterol [facility] ELLIPTA) 62.5-25 MCG/ACT AEPB [id number] Yes Inhale 1 puff into the  lungs daily. Alford Highland, MD Taking Active   Med List Note Valerie Salts, RN 07/09/21 1059): 02-11-18 Message sent to Dr. Mariah Milling in Epic for permission to stop Plavix 7 days. DW Mr 01-05-2022            SDOH:  (Social Determinants of Health) assessments and interventions performed: No SDOH Interventions    Flowsheet Row Care Coordination from 11/04/2022 in [facility] Care Coordination Office Visit from 10/31/2022 in [facility] Visit from 09/19/2022 in [facility] Clinical Support from 08/06/2022 in [facility] Visit from 07/31/2022 in [facility] Visit from 02/18/2022 in [facility] [place]  SDOH Interventions        Food Insecurity Interventions Intervention Not Indicated -- -- Intervention Not Indicated -- --  Housing Interventions Intervention Not Indicated -- -- Intervention Not Indicated -- --  Transportation Interventions Intervention Not Indicated -- -- Intervention Not Indicated -- --  Utilities Interventions -- -- -- Intervention Not Indicated -- --  Alcohol Usage Interventions -- -- -- Intervention Not Indicated (Score <7) -- --  Depression Interventions/Treatment  -- Counseling Counseling Medication, Counseling Medication Counseling  Financial Strain Interventions -- -- -- Intervention Not Indicated -- --  Physical Activity Interventions -- -- -- Patient Refused -- --  Stress Interventions -- -- -- Intervention Not Indicated -- --  Social Connections Interventions -- -- -- Intervention Not Indicated -- --       Medication Assistance: None required.  Patient affirms current coverage meets needs.  Medication Access: Within the past 30 days, how often has patient missed a dose of medication? 0 Is a pillbox or other method used to improve adherence? Yes  Factors that may  affect medication adherence? nonadherence to medications and lack of understanding of disease management Are meds synced by current pharmacy? No  Are meds delivered by current pharmacy? Yes  Does patient experience delays in picking up medications due to transportation concerns? No  Current Rx insurance plan: [employer] Name and location of Current pharmacy:  [facility] [address] [place] - [address] [place] [address] Phone: [phone] Fax: [phone]  [facility] - [place], [place] - [address] [place] [phone] Phone: [phone] Fax: [phone]  [facility] - [place], [place] - [address] [facility] [address] [place] [address] [place] [address] Phone: [phone] Fax: [phone]   Compliance/Adherence/Medication fill history: Care Gaps: Eye exam (due 09/2021)  Star-Rating Drugs: None   Assessment/Plan  Diabetes (A1c goal <8%) -Uncontrolled, improving - A1c 7.8% (10/2022); improved from 8.4 (07/2022), was >10% previosly;  -Currently patient is using Lantus 10 units at night and Novolog 6-16 units per SS; she typically eats 1 meal per day 6-7 pm; she takes Novolog 2-3 times per day however, often without eating to correct for high sugar > 200 -Current home BG readings: Freestyle Libre  Reviewed AGP report: 12/09/22 to 12/22/22. Sensor active: 94%  Time in range (70-180): 58% (goal > 70%)  High (180-250): 33%  Very high (>250): 7%  Low (< 70): 2% (goal < 4%)  GMI: 7.2%; Average glucose: 163  Previous AGP report: 11/25/22 to 12/08/22. Sensor active: 93%  Time in range (70-180): 59% (improved from 52%)  High (180-250): 31%  Very high (>250): 8% (improved from 11%)  Low (< 70): 2% (improved from 6%)  GMI: 7.4%; Average glucose: 170  -Current medications: Novolog 10-20 units SSI w/ meals - Appropriate, Effective, Query safe Lantus Solostar 10 units daily - Appropriate, Effective, Safe,  Accessible Gvoke Hypopen PRN - Appropriate, Effective, Safe, Accessible Freestyle Libre 2 (reader) - Appropriate, Effective, Safe, Accessible -Medications previously tried: glipizide, Januvia, metformin (diarrhea), Trulicity (cost) -Would avoid GLP-1 RA given appetite/wt loss issues, hx acute pancreatitis; SGLT2-I deferred by cardiology d/t hx orthostasis -Advised to have a snack (PB crackers, Glucerna shake) if she is going to skip a meal -Advised to use sliding scale only when eating; if glucose is > 250 and she is not planning on eating she can take 5 units of Novolog only  Depression/Anxiety (Goal: manage symptoms) -Stable - pt reports feeling better lately -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 for past ~4 years after his heart attack; he can walk but is mostly bed/chair-bound; she reports significant  depressed mood, hopelessness, poor appetite, poor sleep; her 2 children do occasionally help which she is grateful for; she has been unable to use home health nurses/aids because her husband has PTSD and is unable to tolerate strangers in his home -She typically goes to bed at 10-11 pm, and wakes at 3-4 AM -PHQ9: 15 (10/2022) - moderately severe depression -Connected with PCP for mental health support -Current treatment: Mirtazapine 7.5 mg daily HS - Appropriate, Effective, Safe, Accessible -Medications previously tried/failed: duloxetine, escitalopram, trazodone, amitriptyline -Previously discussed caregiver stress/burnout, her situation is complicated by her husband's PTSD so only family/friends can help her -Recommend to continue current medication  Hypertension / Heart Failure (BP goal <130/80) -Query controlled - enalapril was discontinued recently in s/o AKI, pt has not been checking BP at home -Current home BP readings: 136/70 at appt last week -Current home weights: none available -Last EF 55-60% (09/2021), previously 25-30% in 2017 -HF type: HFimpEF (EF 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% to >  40%) -NYHA Class: I-II; AHA HF Stage: C (Heart disease and symptoms present) -Hx orthostasis -Current treatment: Carvedilol 3.125mg  BID - Appropriate, Effective, Safe, Accessible Furosemide 20 mg 3x weekly - Appropriate, Effective, Safe, Accessible -Medications previously tried: enalapril, spironolactone (AKI) -Educated on Importance of blood pressure control -Counseled to monitor BP at home daily; contact provider if consistently > 130/80 -Recommended to continue current medication  Hyperlipidemia / CAD (LDL goal < 70) -Query Controlled - LDL 32 (08/2022) while on rosuvastatin, which was stopped 08/2022 due to elevated LFTs -Hx NSTEMI 2016; Hx PAD -Current treatment: Ezetimibe 10 mg daily - Appropriate, Effective, Safe, Accessible Clopidogrel 75 mg daily - Appropriate, Effective, Safe, Accessible -Medications previously tried: rosuvastatin (elevated LFT 08/2022) -Recommended to continue current medication; recheck lipids at next OV  COPD (Goal: control symptoms and prevent exacerbations) -Controlled - pt reports improvement since starting Anoro; she has not needed rescue inhaler in months -Gold Grade: Gold 2 (FEV1 50-79%) -Current COPD Classification:  A (low sx, 0-1 moderate exacerbations, no hospitalizations) -Pulmonary function testing: 03/29/2013 simple spirometry- ratio 65%; FEV1 1.54L (60% pred)  -Exacerbations requiring treatment in last 6 months: 0 -Current treatment  Anoro Ellipta 1 puff daily - Appropriate, Effective, Safe, Accessible Albuterol HFA prn - not using -Medications previously tried: n/a  -Patient reports consistent use of maintenance inhaler -Frequency of rescue inhaler use: rare -Counseled on Benefits of consistent maintenance inhaler use -Recommended to continue current medication  Chronic Kidney Disease Stage 3b  -All medications assessed for renal dosing and appropriateness in chronic kidney disease. 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

## 2022-12-26 ENCOUNTER — Other Ambulatory Visit: Payer: Self-pay | Admitting: Primary Care

## 2022-12-26 ENCOUNTER — Telehealth: Payer: Self-pay | Admitting: Primary Care

## 2022-12-26 DIAGNOSIS — G8929 Other chronic pain: Secondary | ICD-10-CM

## 2022-12-26 NOTE — Telephone Encounter (Signed)
Refill request received from pharmacy. Please see other encounter.

## 2022-12-26 NOTE — Telephone Encounter (Signed)
Prescription Request  12/26/2022  LOV: 10/31/2022  What is the name of the medication or equipment? gabapentin (NEURONTIN) 300 MG capsule   Have you contacted your pharmacy to request a refill? No   Which pharmacy would you like this sent to?  [facility] [address] [place] - [address] [place] [address] Phone: [phone] Fax: [phone]  Patient notified that their request is being sent to the clinical staff for review and that they should receive a response within 2 business days.   Please advise at Mobile [phone] (mobile)

## 2022-12-29 ENCOUNTER — Ambulatory Visit: Payer: Self-pay

## 2022-12-29 NOTE — Patient Outreach (Signed)
  Care Coordination   Follow Up Visit Note   12/29/2022 Name: Sarah Payne MRN: [id number] DOB: [birth year]  Sarah Payne is a 77 y.o. year old female who sees Sarah Nest, NP for primary care. I spoke with  Sarah Payne by phone today.  What matters to the patients health and wellness today?  Patient states her blood sugars have improved. She states she's had a few low blood sugars <70.  Patient verbalized understanding of insulin coverage adjustment as discussed by practice pharmacist, Sarah Payne.  Patient verbally repeated instructions for insulin coverage adjustment and verbalized understanding.  Patient states she continues to eat 1-2 meals per day. Denies snacking throughout day.  Patient reports right shoulder pain at a level of 5. She states her range of motion is limited due to patient.  Patient states she does not remember the name of the orthopedic surgeon that did surgery to her left shoulder. She states she would like to call and schedule for an evaluation of her right shoulder.    Goals Addressed             This Visit's Progress    Management and education regarding health conditions       Interventions Today    Flowsheet Row Most Recent Value  Chronic Disease   Chronic disease during today's visit Diabetes, Congestive Heart Failure (CHF)  General Interventions   General Interventions Discussed/Reviewed General Interventions Reviewed, Doctor Visits  [evaluation of current treatment plan for DM, HF and patients adherence to plan as established by provider. Assessed blood sugar range and heart failure symptoms. Assessed right shoulder pain level.]  Doctor Visits Discussed/Reviewed Doctor Visits Reviewed  Sarah Payne upcoming/ scheduled provider appointments.  patient provided contact name and phone number to previous orthopedic surgeon.]  Education Interventions   Education Provided Provided Education  Provided Verbal Education On Other  [advised to alternate  ice/ heat to right shoulder for pain management. Reviewed hypoglycemic management. Advised to notify provider for frequent blood sugars <70.]  Pharmacy Interventions   Pharmacy Dicussed/Reviewed Pharmacy Topics Reviewed  [medications reviewed and compliance discussed. Reviewed insulin coverage adjustments. Utilized teach back method to confirm patient understood insulin coverage adjustments]              SDOH assessments and interventions completed:  No     Care Coordination Interventions:  Yes, provided   Follow up plan: Follow up call scheduled for 01/30/23 at 11 am    Encounter Outcome:  Pt. Visit Completed   Sarah Ina RN,BSN,CCM [facility] Care Coordination [phone] direct line

## 2023-01-02 DIAGNOSIS — M19011 Primary osteoarthritis, right shoulder: Secondary | ICD-10-CM | POA: Diagnosis not present

## 2023-01-06 ENCOUNTER — Encounter: Payer: Medicare PPO | Admitting: Pharmacist

## 2023-01-08 ENCOUNTER — Encounter: Payer: Medicare PPO | Admitting: Pharmacist

## 2023-01-14 ENCOUNTER — Other Ambulatory Visit: Payer: Self-pay | Admitting: Primary Care

## 2023-01-14 DIAGNOSIS — E1151 Type 2 diabetes mellitus with diabetic peripheral angiopathy without gangrene: Secondary | ICD-10-CM

## 2023-01-20 ENCOUNTER — Other Ambulatory Visit: Payer: Self-pay | Admitting: Primary Care

## 2023-01-20 DIAGNOSIS — E039 Hypothyroidism, unspecified: Secondary | ICD-10-CM

## 2023-01-20 NOTE — Telephone Encounter (Signed)
Prescription Request  01/20/2023  LOV: 10/31/2022  What is the name of the medication or equipment? levothyroxine (SYNTHROID) 100 MCG tablet   Have you contacted your pharmacy to request a refill? No   Which pharmacy would you like this sent to?  [facility] [address], [place] - [address] [place] [address] Phone: [phone] Fax: [phone]     Patient notified that their request is being sent to the clinical staff for review and that they should receive a response within 2 business days.   Please advise at Mobile [phone] (mobile)

## 2023-01-20 NOTE — Telephone Encounter (Signed)
Called and spoke with patient, she states while she was in the hospital she was switched to be taking the dosage, and has been taking that ever since. She states she feels well on that dosage.  She is completely out now and needs a refill.

## 2023-01-20 NOTE — Addendum Note (Signed)
Addended by: Doreene Nest on: 01/20/2023 01:47 PM   Modules accepted: Orders

## 2023-01-20 NOTE — Telephone Encounter (Signed)
.  Please call patient:  Received refill request for levothyroxine 100 mcg tablets. Has she been taking the 100 mcg dose?  It looks like it may have been changed during her hospital stay in February 2024.  Is she running low on levothyroxine?

## 2023-01-21 MED ORDER — LEVOTHYROXINE SODIUM 100 MCG PO TABS: ORAL_TABLET | ORAL | 0 refills | Status: DC

## 2023-01-21 NOTE — Addendum Note (Signed)
Addended by: Doreene Nest on: 01/21/2023 06:58 AM   Modules accepted: Orders

## 2023-01-21 NOTE — Telephone Encounter (Signed)
Refills sent to pharmacy. 

## 2023-01-27 ENCOUNTER — Ambulatory Visit
Payer: Medicare PPO | Attending: Student in an Organized Health Care Education/Training Program | Admitting: Student in an Organized Health Care Education/Training Program

## 2023-01-27 ENCOUNTER — Encounter: Payer: Self-pay | Admitting: Student in an Organized Health Care Education/Training Program

## 2023-01-27 VITALS — BP 137/97 | HR 69 | Temp 97.3°F | Ht 66.0 in | Wt 133.0 lb

## 2023-01-27 DIAGNOSIS — Z794 Long term (current) use of insulin: Secondary | ICD-10-CM | POA: Diagnosis not present

## 2023-01-27 DIAGNOSIS — M542 Cervicalgia: Secondary | ICD-10-CM | POA: Diagnosis present

## 2023-01-27 DIAGNOSIS — M48062 Spinal stenosis, lumbar region with neurogenic claudication: Secondary | ICD-10-CM | POA: Insufficient documentation

## 2023-01-27 DIAGNOSIS — G629 Polyneuropathy, unspecified: Secondary | ICD-10-CM | POA: Insufficient documentation

## 2023-01-27 DIAGNOSIS — M25511 Pain in right shoulder: Secondary | ICD-10-CM | POA: Diagnosis not present

## 2023-01-27 DIAGNOSIS — G8929 Other chronic pain: Secondary | ICD-10-CM | POA: Diagnosis not present

## 2023-01-27 DIAGNOSIS — M5412 Radiculopathy, cervical region: Secondary | ICD-10-CM | POA: Insufficient documentation

## 2023-01-27 DIAGNOSIS — M12811 Other specific arthropathies, not elsewhere classified, right shoulder: Secondary | ICD-10-CM | POA: Insufficient documentation

## 2023-01-27 DIAGNOSIS — M75101 Unspecified rotator cuff tear or rupture of right shoulder, not specified as traumatic: Secondary | ICD-10-CM | POA: Diagnosis not present

## 2023-01-27 DIAGNOSIS — E1140 Type 2 diabetes mellitus with diabetic neuropathy, unspecified: Secondary | ICD-10-CM | POA: Insufficient documentation

## 2023-01-27 DIAGNOSIS — M19011 Primary osteoarthritis, right shoulder: Secondary | ICD-10-CM | POA: Diagnosis not present

## 2023-01-27 DIAGNOSIS — M5136 Other intervertebral disc degeneration, lumbar region: Secondary | ICD-10-CM | POA: Insufficient documentation

## 2023-01-27 MED ORDER — METHOCARBAMOL 1000 MG/10ML IJ SOLN
200.0000 mg | Freq: Once | INTRAMUSCULAR | Status: AC
  Administered 2023-01-27: 200 mg via INTRAMUSCULAR
  Filled 2023-01-27: qty 10

## 2023-01-27 NOTE — Patient Instructions (Signed)
______________________________________________________________________  Preparing for your procedure  Appointments: If you think you may not be able to keep your appointment, call 24-48 hours in advance to cancel. We need time to make it available to others.  During your procedure appointment there will be: No Prescription Refills. No disability issues to discussed. No medication changes or discussions.  Instructions: Food intake: Avoid eating anything solid for at least 8 hours prior to your procedure. Clear liquid intake: You may take clear liquids such as water up to 2 hours prior to your procedure. (No carbonated drinks. No soda.) Transportation: Unless otherwise stated by your physician, bring a driver. Morning Medicines: Except for blood thinners, take all of your other morning medications with a sip of water. Make sure to take your heart and blood pressure medicines. If your blood pressure's lower number is above 100, the case will be rescheduled. Blood thinners: Make sure to stop your blood thinners as instructed.  If you take a blood thinner, but were not instructed to stop it, call our office [phone] and ask to talk to a nurse. Not stopping a blood thinner prior to certain procedures could lead to serious complications. Diabetics on insulin: Notify the staff so that you can be scheduled 1st case in the morning. If your diabetes requires high dose insulin, take only  of your normal insulin dose the morning of the procedure and notify the staff that you have done so. Preventing infections: Shower with an antibacterial soap the morning of your procedure.  Build-up your immune system: Take 1000 mg of Vitamin C with every meal (3 times a day) the day prior to your procedure. Antibiotics: Inform the nursing staff if you are taking any antibiotics or if you have any conditions that may require antibiotics prior to procedures. (Example: recent joint implants)   Pregnancy: If you are  pregnant make sure to notify the nursing staff. Not doing so may result in injury to the fetus, including death.  Sickness: If you have a cold, fever, or any active infections, call and cancel or reschedule your procedure. Receiving steroids while having an infection may result in complications. Arrival: You must be in the facility at least 30 minutes prior to your scheduled procedure. Tardiness: Your scheduled time is also the cutoff time. If you do not arrive at least 15 minutes prior to your procedure, you will be rescheduled.  Children: Do not bring any children with you. Make arrangements to keep them home. Dress appropriately: There is always a possibility that your clothing may get soiled. Avoid long dresses. Valuables: Do not bring any jewelry or valuables.  Reasons to call and reschedule or cancel your procedure: (Following these recommendations will minimize the risk of a serious complication.) Surgeries: Avoid having procedures within 2 weeks of any surgery. (Avoid for 2 weeks before or after any surgery). Flu Shots: Avoid having procedures within 2 weeks of a flu shots or . (Avoid for 2 weeks before or after immunizations). Barium: Avoid having a procedure within 7-10 days after having had a radiological study involving the use of radiological contrast. (Myelograms, Barium swallow or enema study). Heart attacks: Avoid any elective procedures or surgeries for the initial 6 months after a "Myocardial Infarction" (Heart Attack). Blood thinners: It is imperative that you stop these medications before procedures. Let us know if you if you take any blood thinner.  Infection: Avoid procedures during or within two weeks of an infection (including chest colds or gastrointestinal problems). Symptoms associated with infections   include: Localized redness, fever, chills, night sweats or profuse sweating, burning sensation when voiding, cough, congestion, stuffiness, runny nose, sore throat, diarrhea,  nausea, vomiting, cold or Flu symptoms, recent or current infections. It is specially important if the infection is over the area that we intend to treat. Heart and lung problems: Symptoms that may suggest an active cardiopulmonary problem include: cough, chest pain, breathing difficulties or shortness of breath, dizziness, ankle swelling, uncontrolled high or unusually low blood pressure, and/or palpitations. If you are experiencing any of these symptoms, cancel your procedure and contact your primary care physician for an evaluation.  Remember:  Regular Business hours are:  Monday to Thursday 8:00 AM to 4:00 PM  Provider's Schedule: Francisco Naveira, MD:  Procedure days: Tuesday and Thursday 7:30 AM to 4:00 PM  Bilal Lateef, MD:  Procedure days: Monday and Wednesday 7:30 AM to 4:00 PM  ______________________________________________________________________    

## 2023-01-27 NOTE — Progress Notes (Signed)
PROVIDER NOTE: Information contained herein reflects review and annotations entered in association with encounter. Interpretation of such information and data should be left to medically-trained personnel. Information provided to patient can be located elsewhere in the medical record under "Patient Instructions". Document created using STT-dictation technology, any transcriptional errors that may result from process are unintentional.    Patient: Sarah Payne  Service Category: E/M  Provider: Edward Jolly, MD  DOB: 17-Sep-1945  DOS: 01/27/2023  Referring Provider: Doreene Nest, NP  MRN: [id number]  Specialty: Interventional Pain Management  PCP: Doreene Nest, NP  Type: Established Patient  Setting: Ambulatory outpatient    Location: Office  Delivery: Face-to-face     HPI  Sarah Payne, a 77 y.o. year old female, is here today because of her Chronic painful diabetic neuropathy (HCC) [E11.40]. Sarah Payne primary complain today is Back Pain (lower)  Pertinent problems: Sarah Payne has CAD S/P percutaneous coronary angioplasty; History of lumbar fusion; Uncontrolled type 2 diabetes mellitus with hypoglycemia, with long-term current use of insulin (HCC); Bronchitis, chronic obstructive (HCC); Obstructive sleep apnea; COPD (chronic obstructive pulmonary disease) (HCC); Lumbar degenerative disc disease; Lumbar spondylosis with myelopathy; Cannabis use disorder, mild, abuse; Peripheral polyneuropathy; Tobacco dependence; Chronic pain syndrome; Chronic neck pain; Chronic pain of right upper extremity; Cervical spondylosis; and Chronic musculoskeletal pain on their pertinent problem list. Pain Assessment: Severity of Chronic pain is reported as a 2 /10. Location: Back Lower/radiates in to both legs to knees. Onset: More than a month ago. Quality: Constant, Aching. Timing: Constant. Modifying factor(s): sitting down. Vitals:  height is 5\' 6"  (1.676 m) and weight is 133 lb (60.3 kg). Her temporal  temperature is 97.3 F (36.3 C) (abnormal). Her blood pressure is 137/97 (abnormal) and her pulse is 69. Her oxygen saturation is 100%.  BMI: Estimated body mass index is 21.47 kg/m as calculated from the following:   Height as of this encounter: 5\' 6"  (1.676 m).   Weight as of this encounter: 133 lb (60.3 kg). Last encounter: 08/06/2022. Last procedure: Visit date not found.  Reason for encounter: patient-requested evaluation.   Patient is last visit with me was in January.  Since then, unfortunately she was hospitalized for acute pancreatitis.  That has since resolved.  She states that her primary care provider has decreased her gabapentin and she is having trouble managing her pain.  In addition to her lower extremity neuropathic pain, she is experiencing low back pain and bilateral leg weakness that is worse with standing and walking.  She states that the symptoms are getting worse.  She has to bend forward and flex her knees to be able to complete ADLs.  She is also complaining of right cervical spine pain with radiation into her right shoulder and occasionally her right elbow.  She states that she has numbness and tingling in her right fingers.  She states that she received a ultrasound-guided right acromioclavicular joint injection recently with orthopedics which was not helpful.  ROS  Constitutional: Denies any fever or chills Gastrointestinal: No reported hemesis, hematochezia, vomiting, or acute GI distress Musculoskeletal:  Right neck, right shoulder pain, low back pain, Neurological:  Bilateral leg weakness  Medication Review  DULoxetine, FreeStyle Libre 2 Reader, FreeStyle Libre 2 Sensor, Glucagon, HYDROcodone-acetaminophen, Insulin Pen Needle, albuterol, atorvastatin, carvedilol, clobetasol cream, clopidogrel, ezetimibe, famotidine, furosemide, gabapentin, glipiZIDE, glucose blood, insulin aspart, isosorbide mononitrate, levothyroxine, mirtazapine, omeprazole, ondansetron,  pantoprazole, potassium chloride, sitaGLIPtin, and umeclidinium-vilanterol  History Review  Allergy: Sarah Payne has  No Known Allergies. Drug: Sarah Payne  reports current drug use. Drug: Marijuana. Alcohol:  reports no history of alcohol use. Tobacco:  reports that she has been smoking cigarettes. She has a 45.00 pack-year smoking history. She has never used smokeless tobacco. Social: Sarah Payne  reports that she has been smoking cigarettes. She has a 45.00 pack-year smoking history. She has never used smokeless tobacco. She reports current drug use. Drug: Marijuana. She reports that she does not drink alcohol. Medical:  has a past medical history of Aortic atherosclerosis (HCC), Bilateral cataracts (01/2015), CHF (congestive heart failure) (HCC) (09/16/2013), Chronic low back pain, Clostridioides difficile infection (2015), COPD (chronic obstructive pulmonary disease) (HCC), Coronary artery disease, Depression, Diverticulosis, Frequent PVCs, GERD (gastroesophageal reflux disease), History of heart artery stent, Hyperlipidemia, Hypertension, Hypothyroidism, Impingement syndrome of left shoulder, LBBB (left bundle branch block), Long term current use of antithrombotics/antiplatelets, Marijuana abuse, Myocardial infarction (HCC), OSA (obstructive sleep apnea), Paroxysmal SVT (supraventricular tachycardia) (05/08/2021), T2DM (type 2 diabetes mellitus) (HCC), Takotsubo cardiomyopathy, Tendonitis of left rotator cuff, Tobacco abuse, and Vascular dementia. Surgical: Sarah Payne  has a past surgical history that includes Back surgery; Appendectomy; Abdominal hysterectomy; Colonoscopy (N/A, 11/02/2014); Cataract extraction w/PHACO (Left, 01/30/2015); Coronary angioplasty with stent (Left, 02/06/2011); Coronary angioplasty with stent (Left, 09/17/2011); Cataract extraction w/PHACO (Right, 02/13/2015); Cardiac catheterization (N/A, 01/01/2016); Esophagogastroduodenoscopy (egd) with propofol (N/A, 09/21/2018); Shoulder  surgery (Left, 2017); ABDOMINAL AORTOGRAM W/LOWER EXTREMITY (N/A, 09/12/2020); PERIPHERAL VASCULAR ATHERECTOMY (Left, 09/12/2020); PERIPHERAL VASCULAR BALLOON ANGIOPLASTY (09/12/2020); Coronary angioplasty with stent (Left, 10/02/2011); Coronary angioplasty with stent (Left, 08/19/2012); LEFT HEART CATH AND CORONARY ANGIOGRAPHY (Left, 08/02/2014); Reverse shoulder arthroplasty (Left, 10/01/2021); Eye surgery; and Esophagogastroduodenoscopy (egd) with propofol (N/A, 10/06/2022). Family: family history includes Colon cancer in her sister; Heart attack in her mother; Heart disease in her father and mother; Liver cancer in her brother; Throat cancer in her brother.  Laboratory Chemistry Profile   Renal Lab Results  Component Value Date   BUN 19 10/28/2022   CREATININE 1.25 (H) 10/28/2022   BCR 14 09/24/2022   GFR 42.42 (L) 10/28/2022   GFRAA 55 (L) 08/30/2020   GFRNONAA 38 (L) 09/11/2022    Hepatic Lab Results  Component Value Date   AST 14 10/28/2022   ALT 9 10/28/2022   ALBUMIN 4.2 10/28/2022   ALKPHOS 86 10/28/2022   HCVAB NON REACTIVE 09/04/2022   AMYLASE 32 04/11/2016   LIPASE 28.0 10/28/2022    Electrolytes Lab Results  Component Value Date   NA 137 10/28/2022   K 3.4 (L) 10/28/2022   CL 101 10/28/2022   CALCIUM 9.3 10/28/2022   MG 1.6 (L) 10/03/2021    Bone Lab Results  Component Value Date   VD25OH 41 02/25/2013    Inflammation (CRP: Acute Phase) (ESR: Chronic Phase) Lab Results  Component Value Date   CRP 2.1 04/11/2013   ESRSEDRATE 58 (H) 04/11/2013         Note: Above Lab results reviewed.  Recent Imaging Review  VAS [place] ABI WITH/WO TBI  LOWER EXTREMITY DOPPLER STUDY  Patient Name:  SHRONDA BOEH  Date of Exam:   10/16/2022 Medical Rec #: [id number]      Accession #:    [id number] Date of Birth: 10-02-1945      Patient Gender: F Patient Age:   9 years Exam Location:   Procedure:      VAS [place] ABI WITH/WO TBI Referring  Phys:  --------------------------------------------------------------------------------   Indications: Peripheral artery disease.  High Risk Factors: Hypertension, hyperlipidemia, Diabetes,  current smoker,                    coronary artery disease.   Vascular Interventions: Lt SFA Atherectomy/PTA 08/2020.  Comparison Study: 09/17/20 (see table)  Performing Technologist: Rolland Porter    Examination Guidelines: A complete evaluation includes at minimum, Doppler waveform signals and systolic blood pressure reading at the level of bilateral brachial, anterior tibial, and posterior tibial arteries, when vessel segments are accessible. Bilateral testing is considered an integral part of a complete examination. Photoelectric Plethysmograph (PPG) waveforms and toe systolic pressure readings are included as required and additional duplex testing as needed. Limited examinations for reoccurring indications may be performed as noted.    ABI Findings: +---------+------------------+-----+--------+--------+ Right    Rt Pressure (mmHg)IndexWaveformComment  +---------+------------------+-----+--------+--------+ Brachial 163                                     +---------+------------------+-----+--------+--------+ PTA      178               1.09 biphasic         +---------+------------------+-----+--------+--------+ DP       176               1.08 biphasic         +---------+------------------+-----+--------+--------+ Great Toe102               0.63 Normal           +---------+------------------+-----+--------+--------+  +---------+------------------+-----+--------+-------+ Left     Lt Pressure (mmHg)IndexWaveformComment +---------+------------------+-----+--------+-------+ Brachial 159                                    +---------+------------------+-----+--------+-------+ PTA      184               1.13 biphasic         +---------+------------------+-----+--------+-------+ DP       170               1.04 biphasic        +---------+------------------+-----+--------+-------+ Great Toe107               0.66 Normal          +---------+------------------+-----+--------+-------+  +-------+-----------+-----------+------------+------------+ ABI/TBIToday's ABIToday's TBIPrevious ABIPrevious TBI +-------+-----------+-----------+------------+------------+ Right  1.09       0.63       1.04        0.8          +-------+-----------+-----------+------------+------------+ Left   1.13       0.66       1.08        0.66         +-------+-----------+-----------+------------+------------+     TOES Findings: +----------+---------------+--------+-------+ Right ToesPressure (mmHg)WaveformComment +----------+---------------+--------+-------+ 1st Digit 102            Normal          +----------+---------------+--------+-------+     +---------+---------------+--------+-------+ Left ToesPressure (mmHg)WaveformComment +---------+---------------+--------+-------+ 1st [id number]            Normal          +---------+---------------+--------+-------+        Bilateral ABIs and TBIs appear essentially unchanged.   Summary: Right: Resting right ankle-brachial index is within normal range. The right toe-brachial index is abnormal.  Left: Resting left ankle-brachial index is within normal range. The left toe-brachial  index is abnormal.  *See table(s) above for measurements and observations.   Suggest follow up study in 12 months.  Electronically signed by Dina Rich MD on 10/16/2022 at 1:11:47 PM.      Final (Updated)   Note: Reviewed        Physical Exam  General appearance: Well nourished, well developed, and well hydrated. In no apparent acute distress Mental status: Alert, oriented x 3 (person, place, & time)       Respiratory: No evidence of acute  respiratory distress Eyes: PERLA Vitals: BP (!) 137/97   Pulse 69   Temp (!) 97.3 F (36.3 C) (Temporal)   Ht 5\' 6"  (1.676 m)   Wt 133 lb (60.3 kg)   SpO2 100%   BMI 21.47 kg/m  BMI: Estimated body mass index is 21.47 kg/m as calculated from the following:   Height as of this encounter: 5\' 6"  (1.676 m).   Weight as of this encounter: 133 lb (60.3 kg). Ideal: Ideal body weight: 59.3 kg (130 lb 11.7 oz) Adjusted ideal body weight: 59.7 kg (131 lb 10.2 oz)  Cervical Spine Area Exam  Skin & Axial Inspection: No masses, redness, edema, swelling, or associated skin lesions Alignment: Symmetrical Functional ROM: Pain restricted ROM      Stability: No instability detected Muscle Tone/Strength: Functionally intact. No obvious neuro-muscular anomalies detected. Sensory (Neurological): Dermatomal pain pattern Palpation: No palpable anomalies             Upper Extremity (UE) Exam    Side: Right upper extremity  Side: Left upper extremity  Skin & Extremity Inspection: Skin color, temperature, and hair growth are WNL. No peripheral edema or cyanosis. No masses, redness, swelling, asymmetry, or associated skin lesions. No contractures.  Skin & Extremity Inspection: Skin color, temperature, and hair growth are WNL. No peripheral edema or cyanosis. No masses, redness, swelling, asymmetry, or associated skin lesions. No contractures.  Functional ROM: Pain restricted ROM for shoulder and elbow  Functional ROM: Unrestricted ROM          Muscle Tone/Strength: Functionally intact. No obvious neuro-muscular anomalies detected.  Muscle Tone/Strength: Functionally intact. No obvious neuro-muscular anomalies detected.  Sensory (Neurological): Dermatomal pain pattern          Sensory (Neurological): Unimpaired          Palpation: No palpable anomalies              Palpation: No palpable anomalies              Provocative Test(s):  Phalen's test: deferred Tinel's test: deferred Apley's scratch test (touch  opposite shoulder):  Action 1 (Across chest): Decreased ROM Action 2 (Overhead): Decreased ROM Action 3 (LB reach): Decreased ROM   Provocative Test(s):  Phalen's test: deferred Tinel's test: deferred Apley's scratch test (touch opposite shoulder):  Action 1 (Across chest): deferred Action 2 (Overhead): deferred Action 3 (LB reach): deferred    Lumbar Spine Area Exam  Skin & Axial Inspection: No masses, redness, or swelling Alignment: Symmetrical Functional ROM: Pain restricted ROM       Stability: No instability detected Muscle Tone/Strength: Functionally intact. No obvious neuro-muscular anomalies detected. Sensory (Neurological): Neurogenic pain pattern Palpation: No palpable anomalies        Gait & Posture Assessment  Ambulation: Unassisted Gait: Relatively normal for age and body habitus Posture: WNL  Lower Extremity Exam    Side: Right lower extremity  Side: Left lower extremity  Stability: No instability observed  Stability: No instability observed          Skin & Extremity Inspection: Skin color, temperature, and hair growth are WNL. No peripheral edema or cyanosis. No masses, redness, swelling, asymmetry, or associated skin lesions. No contractures.  Skin & Extremity Inspection: Skin color, temperature, and hair growth are WNL. No peripheral edema or cyanosis. No masses, redness, swelling, asymmetry, or associated skin lesions. No contractures.  Functional ROM: Pain restricted ROM for hip and knee joints          Functional ROM: Pain restricted ROM for hip and knee joints          Muscle Tone/Strength: Functionally intact. No obvious neuro-muscular anomalies detected.  Muscle Tone/Strength: Functionally intact. No obvious neuro-muscular anomalies detected.  Sensory (Neurological): Neurogenic pain pattern        Sensory (Neurological): Neurogenic pain pattern        DTR: Patellar: deferred today Achilles: deferred today Plantar: deferred today  DTR: Patellar:  deferred today Achilles: deferred today Plantar: deferred today  Palpation: No palpable anomalies  Palpation: No palpable anomalies    Assessment   Diagnosis Status  1. Chronic painful diabetic neuropathy (HCC)   2. Peripheral polyneuropathy   3. Spinal stenosis, lumbar region, with neurogenic claudication   4. Lumbar degenerative disc disease   5. Chronic right shoulder pain   6. Localized osteoarthritis of right shoulder   7. Right rotator cuff tear arthropathy   8. Cervical radicular pain   9. Cervicalgia    Persistent Persistent Worsening   Updated Problems: Problem  Chronic Painful Diabetic Neuropathy (Hcc)  Spinal Stenosis, Lumbar Region, With Neurogenic Claudication    Plan of Care    Sarah Payne has a current medication list which includes the following long-term medication(s): albuterol, carvedilol, ezetimibe, famotidine, furosemide, gabapentin, novolog flexpen, levothyroxine, mirtazapine, omeprazole, and potassium chloride.  1. Chronic painful diabetic neuropathy (HCC) - NEUROLYSIS; Future  2. Peripheral polyneuropathy - NEUROLYSIS; Future - SUPRASCAPULAR NERVE BLOCK; Future  3. Spinal stenosis, lumbar region, with neurogenic claudication - MR LUMBAR SPINE WO CONTRAST; Future  4. Lumbar degenerative disc disease - MR LUMBAR SPINE WO CONTRAST; Future  5. Chronic right shoulder pain - SUPRASCAPULAR NERVE BLOCK; Future  6. Localized osteoarthritis of right shoulder - SUPRASCAPULAR NERVE BLOCK; Future  7. Right rotator cuff tear arthropathy - SUPRASCAPULAR NERVE BLOCK; Future  8. Cervical radicular pain - MR CERVICAL SPINE WO CONTRAST; Future  9. Cervicalgia - MR CERVICAL SPINE WO CONTRAST; Future    Pharmacotherapy (Medications Ordered): Meds ordered this encounter  Medications   methocarbamol (ROBAXIN) injection 200 mg   Orders:  Orders Placed This Encounter  Procedures   NEUROLYSIS    Please order Qutenza patches from pharmacy     Standing Status:   Future    Standing Expiration Date:   04/29/2023    Order Specific Question:   Where will this procedure be performed?    Answer:   [facility]   SUPRASCAPULAR NERVE BLOCK    For shoulder pain.    Standing Status:   Future    Standing Expiration Date:   04/29/2023    Scheduling Instructions:     Purpose:RIGHT     Level(s): Suprascapular notch     Sedation: without     Scheduling Timeframe: As permitted by the schedule    Order Specific Question:   Where will this procedure be performed?    Answer:   [facility]   MR LUMBAR SPINE WO CONTRAST  Patient presents with axial pain with possible radicular component. Please assist [place] in identifying specific level(s) and laterality of any additional findings such as: 1. Facet (Zygapophyseal) joint DJD (Hypertrophy, space narrowing, subchondral sclerosis, and/or osteophyte formation) 2. DDD and/or IVDD (Loss of disc height, desiccation, gas patterns, osteophytes, endplate sclerosis, or "Black disc disease") 3. Pars defects 4. Spondylolisthesis, spondylosis, and/or spondyloarthropathies (include Degree/Grade of displacement in mm) (stability) 5. Vertebral body Fractures (acute/chronic) (state percentage of collapse) 6. Demineralization (osteopenia/osteoporotic) 7. Bone pathology 8. Foraminal narrowing  9. Surgical changes 10. Central, Lateral Recess, and/or Foraminal Stenosis (include AP diameter of stenosis in mm) 11. Surgical changes (hardware type, status, and presence of fibrosis) 12. Modic Type Changes (MRI only) 13. IVDD (Disc bulge, protrusion, herniation, extrusion) (Level, laterality, extent)    Standing Status:   Future    Standing Expiration Date:   02/27/2023    Scheduling Instructions:     Please make sure that the patient understands that this needs to be done as soon as possible. Never have the patient do the imaging "just before the next appointment". Inform patient that having the imaging done  within the [facility] will expedite the availability of the results and will provide      imaging availability to the requesting physician. In addition inform the patient that the imaging order has an expiration date and will not be renewed if not done within the active period.    Order Specific Question:   What is the patient's sedation requirement?    Answer:   No Sedation    Order Specific Question:   Does the patient have a pacemaker or implanted devices?    Answer:   No    Order Specific Question:   Preferred imaging location?    Answer:   [facility] (table limit-350lbs)    Order Specific Question:   Call Results- Best Contact Number?    Answer:   [phone] [facility])    Order Specific Question:   Radiology Contrast Protocol - do NOT remove file path    Answer:   \\[email or web address]   MR CERVICAL SPINE WO CONTRAST    Patient presents with axial pain with possible radicular component. Please assist [place] in identifying specific level(s) and laterality of any additional findings such as: 1. Facet (Zygapophyseal) joint DJD (Hypertrophy, space narrowing, subchondral sclerosis, and/or osteophyte formation) 2. DDD and/or IVDD (Loss of disc height, desiccation, gas patterns, osteophytes, endplate sclerosis, or "Black disc disease") 3. Pars defects 4. Spondylolisthesis, spondylosis, and/or spondyloarthropathies (include Degree/Grade of displacement in mm) (stability) 5. Vertebral body Fractures (acute/chronic) (state percentage of collapse) 6. Demineralization (osteopenia/osteoporotic) 7. Bone pathology 8. Foraminal narrowing  9. Surgical changes 10. Central, Lateral Recess, and/or Foraminal Stenosis (include AP diameter of stenosis in mm) 11. Surgical changes (hardware type, status, and presence of fibrosis) 12. Modic Type Changes (MRI only) 13. IVDD (Disc bulge, protrusion, herniation, extrusion) (Level, laterality, extent)    Standing Status:    Future    Standing Expiration Date:   02/27/2023    Scheduling Instructions:     Please make sure that the patient understands that this needs to be done as soon as possible. Never have the patient do the imaging "just before the next appointment". Inform patient that having the imaging done within the [facility] will expedite the availability of the results and will provide      imaging availability to the requesting physician. In addition inform the patient that the imaging order has an  expiration date and will not be renewed if not done within the active period.    Order Specific Question:   What is the patient's sedation requirement?    Answer:   No Sedation    Order Specific Question:   Does the patient have a pacemaker or implanted devices?    Answer:   No    Order Specific Question:   Preferred imaging location?    Answer:   [facility] (table limit-350lbs)    Order Specific Question:   Call Results- Best Contact Number?    Answer:   [phone] [facility])    Order Specific Question:   Radiology Contrast Protocol - do NOT remove file path    Answer:   \\[email or web address]   Follow-up plan:   Return in about 13 days (around 02/09/2023) for Right SSNB + Qutenza.     Recent Visits No visits were found meeting these conditions. Showing recent visits within past 90 days and meeting all other requirements Today's Visits Date Type Provider Dept  01/27/23 Office Visit Edward Jolly, MD [facility]  Showing today's visits and meeting all other requirements Future Appointments No visits were found meeting these conditions. Showing future appointments within next 90 days and meeting all other requirements  I discussed the assessment and treatment plan with the patient. The patient was provided an opportunity to ask questions and all were answered. The patient agreed with the plan and demonstrated an understanding of the  instructions.  Patient advised to call back or seek an in-person evaluation if the symptoms or condition worsens.  Duration of encounter: .  Total time on encounter, as per AMA guidelines included both the face-to-face and non-face-to-face time personally spent by the physician and/or other qualified health care professional(s) on the day of the encounter (includes time in activities that require the physician or other qualified health care professional and does not include time in activities normally performed by clinical staff). Physician's time may include the following activities when performed: Preparing to see the patient (e.g., pre-charting review of records, searching for previously ordered imaging, lab work, and nerve conduction tests) Review of prior analgesic pharmacotherapies. Reviewing PMP Interpreting ordered tests (e.g., lab work, imaging, nerve conduction tests) Performing post-procedure evaluations, including interpretation of diagnostic procedures Obtaining and/or reviewing separately obtained history Performing a medically appropriate examination and/or evaluation Counseling and educating the patient/family/caregiver Ordering medications, tests, or procedures Referring and communicating with other health care professionals (when not separately reported) Documenting clinical information in the electronic or other health record Independently interpreting results (not separately reported) and communicating results to the patient/ family/caregiver Care coordination (not separately reported)  Note by: Edward Jolly, MD Date: 01/27/2023; Time: 10:23 AM

## 2023-01-29 ENCOUNTER — Encounter: Payer: Medicare PPO | Admitting: Student in an Organized Health Care Education/Training Program

## 2023-01-30 ENCOUNTER — Ambulatory Visit: Payer: Self-pay

## 2023-01-30 NOTE — Patient Outreach (Signed)
  Care Coordination   Follow Up Visit Note   01/30/2023 Name: Sarah Payne MRN: [id number] DOB: [birth year]  Sarah Payne is a 77 y.o. year old female who sees Doreene Nest, NP for primary care. I spoke with  Sarah Payne by phone today.  What matters to the patients health and wellness today?  Patient states blood sugars are still up and down. She reports having daily hypoglycemic events around 2-3 hours after lunch.  Patient reports low blood sugar of 45 on yesterday. She states blood sugars are ranging in the 40's to 245.  She is using a CGM and is following current medication regimen as prescribed.   Patient denies LE swelling.  She reports having mild increase in SOB and states she gets more fatigue.  Patient states she  is still losing weight periodically. Reports her most current weight was 130 lbs.  Patient reports having recent injection to right shoulder by orthopedic doctor with some relief.    Goals Addressed             This Visit's Progress    Management and education regarding health conditions       Interventions Today    Flowsheet Row Most Recent Value  Chronic Disease   Chronic disease during today's visit Diabetes, Congestive Heart Failure (CHF), Other  [right shoulder pain]  General Interventions   General Interventions Discussed/Reviewed General Interventions Reviewed, Doctor Visits  [evaluation of current treatment plan for diabetes, HF, right shoulder pain and patients adherence to plan as established by provider.  Assessed for BS readings, HF symptoms, and pain level. Assessed for weight]  Doctor Visits Discussed/Reviewed Doctor Visits Reviewed  [name] upcoming provider visits. Confirmed patient aware of upcoming MRI date/ time.]  Education Interventions   Education Provided Provided Education  [reviewed hypoglycemic treatment]  Provided Verbal Education On Blood Sugar Monitoring  Nutrition Interventions   Nutrition Discussed/Reviewed Nutrition Reviewed   [Discussed need to increase caloric intake.  Provided patient examples of high calorie foods.]  Pharmacy Interventions   Pharmacy Dicussed/Reviewed Pharmacy Topics Reviewed  [medications reviewed.  Message sent to practice pharmacist that patient needs to reschedule appointment date and informed patient having daily low blood sugar readings.]              SDOH assessments and interventions completed:  No     Care Coordination Interventions:  Yes, provided   Follow up plan: Follow up call scheduled for 03/04/23    Encounter Outcome:  Pt. Visit Completed   [name] RN,BSN,CCM [facility] Care Coordination [phone] direct line

## 2023-01-30 NOTE — Patient Instructions (Signed)
Visit Information  Thank you for taking time to visit with me today. Please don't hesitate to contact me if I can be of assistance to you.   Following are the goals we discussed today:   Goals Addressed             This Visit's Progress    Management and education regarding health conditions       Interventions Today    Flowsheet Row Most Recent Value  Chronic Disease   Chronic disease during today's visit Diabetes, Congestive Heart Failure (CHF), Other  [right shoulder pain]  General Interventions   General Interventions Discussed/Reviewed General Interventions Reviewed, Doctor Visits  [evaluation of current treatment plan for diabetes, HF, right shoulder pain and patients adherence to plan as established by provider.  Assessed for BS readings, HF symptoms, and pain level. Assessed for weight]  Doctor Visits Discussed/Reviewed Doctor Visits Reviewed  [name] upcoming provider visits. Confirmed patient aware of upcoming MRI date/ time.]  Education Interventions   Education Provided Provided Education  [reviewed hypoglycemic treatment]  Provided Verbal Education On Blood Sugar Monitoring  Nutrition Interventions   Nutrition Discussed/Reviewed Nutrition Reviewed  [Discussed need to increase caloric intake.  Provided patient examples of high calorie foods.]  Pharmacy Interventions   Pharmacy Dicussed/Reviewed Pharmacy Topics Reviewed  [medications reviewed.  Message sent to practice pharmacist that patient needs to reschedule appointment date and informed patient having daily low blood sugar readings.]              Our next appointment is by telephone on 03/04/23 at 11:30 am  Please call the care guide team at [phone] if you need to cancel or reschedule your appointment.   If you are experiencing a Mental Health or Behavioral Health Crisis or need someone to talk to, please call the Suicide and Crisis Lifeline: 988 call 1-800-273-TALK (toll free, 24 hour  hotline)  Patient verbalizes understanding of instructions and care plan provided today and agrees to view in MyChart. Active MyChart status and patient understanding of how to access instructions and care plan via MyChart confirmed with patient.     [name] RN,BSN,CCM [facility] Care Coordination [phone] direct line

## 2023-02-06 ENCOUNTER — Ambulatory Visit
Admission: RE | Admit: 2023-02-06 | Discharge: 2023-02-06 | Disposition: A | Discharge status: Home / Self Care | Payer: Medicare PPO | Source: Ambulatory Visit | Attending: Student in an Organized Health Care Education/Training Program | Admitting: Student in an Organized Health Care Education/Training Program

## 2023-02-06 DIAGNOSIS — M542 Cervicalgia: Secondary | ICD-10-CM

## 2023-02-06 DIAGNOSIS — M5412 Radiculopathy, cervical region: Secondary | ICD-10-CM

## 2023-02-06 DIAGNOSIS — M4312 Spondylolisthesis, cervical region: Secondary | ICD-10-CM | POA: Diagnosis not present

## 2023-02-06 DIAGNOSIS — M4802 Spinal stenosis, cervical region: Secondary | ICD-10-CM | POA: Diagnosis not present

## 2023-02-06 DIAGNOSIS — M5136 Other intervertebral disc degeneration, lumbar region: Secondary | ICD-10-CM | POA: Diagnosis not present

## 2023-02-06 DIAGNOSIS — M48062 Spinal stenosis, lumbar region with neurogenic claudication: Secondary | ICD-10-CM | POA: Insufficient documentation

## 2023-02-06 DIAGNOSIS — M4722 Other spondylosis with radiculopathy, cervical region: Secondary | ICD-10-CM | POA: Diagnosis not present

## 2023-02-06 DIAGNOSIS — M5011 Cervical disc disorder with radiculopathy,  high cervical region: Secondary | ICD-10-CM | POA: Diagnosis not present

## 2023-02-06 DIAGNOSIS — M48061 Spinal stenosis, lumbar region without neurogenic claudication: Secondary | ICD-10-CM | POA: Diagnosis not present

## 2023-02-06 DIAGNOSIS — Z981 Arthrodesis status: Secondary | ICD-10-CM | POA: Diagnosis not present

## 2023-02-06 DIAGNOSIS — M5126 Other intervertebral disc displacement, lumbar region: Secondary | ICD-10-CM | POA: Diagnosis not present

## 2023-02-09 ENCOUNTER — Other Ambulatory Visit: Payer: Medicare PPO | Admitting: Pharmacist

## 2023-02-09 NOTE — Patient Instructions (Signed)
Hi Sarah Payne,   It was great talking to you today.

## 2023-02-09 NOTE — Progress Notes (Unsigned)
02/09/2023 Name: Sarah Payne MRN: [id number] DOB: 02/23/1946  Chief Complaint  Patient presents with   Medication Management   Diabetes    Sarah Payne is a 77 y.o. year old female who presented for a telephone visit.   They were referred to the pharmacist by their PCP for assistance in managing diabetes.    Subjective:  Care Team: Primary Care Provider: Doreene Nest, NP ; Next Scheduled Visit: 03/03/23   Medication Access/Adherence  Current Pharmacy:  [facility] [address], [place] - [address] [place] [address] Phone: [phone] Fax: [phone]  [facility] - [place], [place] - [address] [place] [id number] Phone: [phone] Fax: [phone]  [facility] - [place], [place] - [address] [facility] [address] [place] [address] Phone: [phone] Fax: [phone]   Patient reports affordability concerns with their medications: No  Patient reports access/transportation concerns to their pharmacy: No  Patient reports adherence concerns with their medications:  No    Reports weight loss, depression - contributing to not eating   Diabetes:  Current medications: Novolog 8 units twice daily (lunch and supper time); Januvia 100 mg daily  Medications previously tried: glipizide, Januvia, metformin IR (diarrhea), Trulicity (cost) -Would avoid GLP-1 RA given appetite/wt loss issues, hx acute pancreatitis; SGLT2-I deferred by cardiology d/t hx orthostasis  Date of Download: 5/21-12/22/22 - reports she does plan to put on a new sensor this week % Time CGM is active: 94% Average Glucose: 163 mg/dL Glucose Management Indicator: 7.2%  Glucose Variability: 32.7 (goal <36%) Time in Goal:  - Time in range 70-180: 58% - Time above range: 40% - Time below range: 2%  Reports she has been having more frequent hypoglycemic episodes in the past several weeks. Reports she has  little appetite and often skips meals. Unclear if she appropriately skips insulin if she skips a meal. Reports she is frustrated by her sugar "constantly dropping"  Patient reports hypoglycemic s/sx including dizziness, shakiness, sweating.   Current meal patterns:  - Breakfast: typically skipping - Lunch: hot dog, hamburger; salad; - Supper: meat and 2 vegetables  Hyperlipidemia/ASCVD Risk Reduction  Current lipid lowering medications: atorvastatin 80 mg daily, ezetimibe 10 mg daily  Antiplatelet regimen: clopidogrel 75 mg daily  ASCVD History:   Heart Failure:  Current medications:  ACEi/ARB/ARNI: none SGLT2i: none Beta blocker: carvedilol 3.125 mg twice daily Mineralocorticoid Receptor Antagonist: none Diuretic regimen: furosemide 20 mg PRN - but has not been taking, and has not been taking potassium  Additionally on isosorbide 30 mg daily  Depression/Insomnia:  Current medications: prescribed mirtazapine, but has not been filled in over a year  Objective:  Lab Results  Component Value Date   HGBA1C 7.8 (A) 10/31/2022    Lab Results  Component Value Date   CREATININE 1.25 (H) 10/28/2022   BUN 19 10/28/2022   NA 137 10/28/2022   K 3.4 (L) 10/28/2022   CL 101 10/28/2022   CO2 29 10/28/2022    Lab Results  Component Value Date   CHOL 117 08/28/2022   HDL 42 08/28/2022   LDLCALC 32 08/28/2022   LDLDIRECT 52 08/28/2022   TRIG 213 (H) 08/28/2022   CHOLHDL 2.8 08/28/2022    Medications Reviewed Today     Reviewed by Alden Hipp, RPH-CPP (Pharmacist) on 02/09/23 at 1415  Med List Status: <None>   Medication Order Taking? Sig Documenting Provider Last Dose Status Informant  albuterol (VENTOLIN HFA) 108 (90 Base) MCG/ACT inhaler [id number] Yes Inhale 2 puffs into the lungs every 6 (six) hours as needed for wheezing or shortness of breath. Doreene Nest, NP Taking Active Self  atorvastatin (LIPITOR) 80 MG tablet [id number] Yes Take 80 mg by mouth  daily. [provider] Taking Active   carvedilol (COREG) 3.125 MG tablet [id number] Yes Take 1 tablet (3.125 mg total) by mouth 2 (two) times daily. Sondra Barges, PA-C Taking Active Self  clobetasol cream (TEMOVATE) 0.05 % [id number] Yes Apply 1 Application topically 2 (two) times daily as needed. For vaginal itching. Doreene Nest, NP Taking Active Self  clopidogrel (PLAVIX) 75 MG tablet [id number] Yes Take 1 tablet (75 mg total) by mouth daily. Sondra Barges, PA-C Taking Active Self  Continuous Blood Gluc Receiver (FREESTYLE LIBRE 2 READER) DEVI [id number]  Use with sensor to check blood sugars 6 times daily. Doreene Nest, NP  Active Self  Continuous Glucose Sensor (FREESTYLE LIBRE 2 SENSOR) MISC [id number]  USE ONE SENSOR EVERY 14 DAYS TO CHECK BLOOD SUGAR SIX TIMES PER DAY Doreene Nest, NP  Active   DULoxetine (CYMBALTA) 60 MG capsule [id number] Yes Take 60 mg by mouth daily. [provider] Taking Active   ezetimibe (ZETIA) 10 MG tablet [id number] Yes Take 1 tablet (10 mg total) by mouth daily. For cholesterol. Marisue Ivan D, PA-C Taking Active Self  famotidine (PEPCID) 20 MG tablet [id number] Yes Take 1 tablet (20 mg total) by mouth at bedtime. For nausea and acid Doreene Nest, NP Taking Active   furosemide (LASIX) 20 MG tablet [id number] No Take 1 tablet (20 mg total) by mouth as needed (As needed for weight gain greater than 3 pounds overnight or increased shortness of breath.). Take one tab only on Monday, Wednesday, & Fridays  Patient not taking: Reported on 02/09/2023   Sondra Barges, PA-C Not Taking Active Self  gabapentin (NEURONTIN) 300 MG capsule [id number] Yes TAKE 1 CAPSULE BY MOUTH TWICE DAILY FOR PAIN Doreene Nest, NP Taking Active   Glucagon (GVOKE HYPOPEN 2-PACK) 1 MG/0.2ML SOAJ [id number]  Inject 1 mg (1 pen) as needed for severe low blood sugar (sustained glucose less than 55 despite oral glucose treatments). May repeat in 15 minutes as  needed. Doreene Nest, NP  Active   insulin aspart (NOVOLOG FLEXPEN) 100 UNIT/ML FlexPen [id number] Yes INJECT 10 UNITS INTO THE SKIN WITH BREAKFAST, 18 UNITS WITH LUNCH, AND 16 UNITS WITH DINNER FOR DIABETES  Patient taking differently: 6-16 units per sliding scale   Doreene Nest, NP Taking Active            Med Note Clearance Coots, Calen Geister T   Mon Feb 09, 2023  2:13 PM) 8 units twice daily  Insulin Pen Needle (INSUPEN PEN NEEDLES) 32G X 4 MM MISC [id number] Yes Use to inject insulin 4 times daily. Doreene Nest, NP Taking Active Self  isosorbide mononitrate (IMDUR) 30 MG 24 hr tablet [id number] Yes Take 30 mg by mouth daily. [provider] Taking Active   levothyroxine (SYNTHROID) 100 MCG tablet [id number] Yes Take 1 tablet by mouth every morning on an empty stomach with water only.  No food or other medications for 30 minutes. Doreene Nest, NP Taking Active   mirtazapine (REMERON) 7.5 MG tablet [id number] No Take 1 tablet (7.5 mg total) by mouth at bedtime. For appetite and sleep.  Patient not taking: Reported on 02/09/2023   Doreene Nest, NP Not  Taking Active Self  omeprazole (PRILOSEC) 40 MG capsule [id number] Yes Take 1 capsule (40 mg total) by mouth daily. Wyline Mood, MD Taking Active   ondansetron [facility]) 4 MG tablet [id number] Yes Take 1 tablet (4 mg total) by mouth every 6 (six) hours as needed for nausea. Tower, Audrie Gallus, MD Taking Active   ondansetron (ZOFRAN-ODT) 4 MG disintegrating tablet [id number] No Take 1 tablet (4 mg total) by mouth every 8 (eight) hours as needed for nausea or vomiting.  Patient not taking: Reported on 02/09/2023   Minna Antis, MD Not Taking Active   potassium chloride (KLOR-CON M) 10 MEQ tablet [id number] No Only take on days you take your lasix. Take 1 tablet (10 mEq total) by mouth for 1 dose on those days you take your lasix 20mg  pill.  Patient not taking: Reported on 02/09/2023   Sondra Barges, PA-C Not Taking Active Self   sitaGLIPtin (JANUVIA) 100 MG tablet [id number] Yes Take 100 mg by mouth daily. [provider] Taking Active   umeclidinium-vilanterol (ANORO ELLIPTA) 62.5-25 MCG/ACT AEPB [id number] No Inhale 1 puff into the lungs daily.  Patient not taking: Reported on 02/09/2023   Alford Highland, MD Not Taking Active   Med List Note Valerie Salts, RN 07/09/21 1059): 02-11-18 Message sent to Dr. Mariah Milling in Epic for permission to stop Plavix 7 days. DW Mr 01-05-2022              Assessment/Plan:   Diabetes: - Currently uncontrolled due to glucose variability, hypoglycemia - Discussed risk of hypoglycemia with insulin, and especially with decreased appetite. See mirtazapine recommendation below. However, does not appear metformin XR had ever been tried. Discussed holding mealtime insulin and trying metformin XR 500 mg daily, continue Januvia 100 mg daily. Patient is amenable to this, will discuss with PCP.  - Recommend to check glucose continuously using CGM  Hyperlipidemia/ASCVD Risk Reduction: - Currently controlled.  - Recommend to continue current regimen   Heart Failure: - Currently managed appropriately given prior orthostasis concerns  - Recommend to continue current regimen   Depression: - Uncontrolled - Discussed that restarting mirtazapine may help mood, appetite. Patient is amenable to this. Will request PCP send a refill to [facility].    Follow Up Plan: phone call in ~ 6 weeks  Catie TClearance Coots, PharmD, BCACP, CPP Clinical Pharmacist [facility] [phone]

## 2023-02-10 ENCOUNTER — Telehealth: Payer: Self-pay | Admitting: Primary Care

## 2023-02-10 DIAGNOSIS — F32A Depression, unspecified: Secondary | ICD-10-CM

## 2023-02-10 MED ORDER — MIRTAZAPINE 7.5 MG PO TABS
7.5000 mg | ORAL_TABLET | Freq: Every day | ORAL | 0 refills | Status: DC
Start: 2023-02-10 — End: 2023-03-03

## 2023-02-10 NOTE — Progress Notes (Signed)
Care Coordination Call  Discussed with PCP. Given eGFR fluctuations previously, hold off on metformin. PCP sent refill on mirtazapine. Will follow up in 2 weeks.   Catie Eppie Gibson, PharmD, BCACP, CPP Clinical Pharmacist [facility] [phone]

## 2023-02-10 NOTE — Telephone Encounter (Signed)
Catie, nevermind! Harvin Hazel already notified patient of our plan. Thanks!

## 2023-02-10 NOTE — Telephone Encounter (Signed)
Called patient and reviewed all information. Patient verbalized understanding. Will call if any further questions.  

## 2023-02-10 NOTE — Telephone Encounter (Addendum)
Response sent back to Catie Clearance Coots today:  Her CKD has prevented metformin use. Will defer other options back to pharmacy. Would avoid metformin for now.  Agree to resume mirtazipine at 7.5 mg HS. Please notify patient of this. Have her update if no improvement.     ----- Message from [facility] sent at 02/09/2023  4:29 PM EDT ----- Hi - see bold, thoughts on trial of metformin XR 500 mg daily (stop bolus insulin since she isn't eating), and more importantly, refill mirtazapine?   Thanks!  Catie

## 2023-02-17 ENCOUNTER — Encounter: Payer: Self-pay | Admitting: Student in an Organized Health Care Education/Training Program

## 2023-02-17 ENCOUNTER — Ambulatory Visit
Payer: Medicare PPO | Attending: Student in an Organized Health Care Education/Training Program | Admitting: Student in an Organized Health Care Education/Training Program

## 2023-02-17 ENCOUNTER — Telehealth: Payer: Self-pay

## 2023-02-17 VITALS — BP 140/89 | HR 73 | Temp 97.2°F | Resp 16 | Ht 66.0 in | Wt 127.0 lb

## 2023-02-17 DIAGNOSIS — M5412 Radiculopathy, cervical region: Secondary | ICD-10-CM | POA: Insufficient documentation

## 2023-02-17 DIAGNOSIS — M542 Cervicalgia: Secondary | ICD-10-CM | POA: Diagnosis not present

## 2023-02-17 DIAGNOSIS — G8929 Other chronic pain: Secondary | ICD-10-CM | POA: Diagnosis not present

## 2023-02-17 DIAGNOSIS — M48062 Spinal stenosis, lumbar region with neurogenic claudication: Secondary | ICD-10-CM | POA: Insufficient documentation

## 2023-02-17 DIAGNOSIS — M5416 Radiculopathy, lumbar region: Secondary | ICD-10-CM | POA: Insufficient documentation

## 2023-02-17 DIAGNOSIS — M961 Postlaminectomy syndrome, not elsewhere classified: Secondary | ICD-10-CM | POA: Insufficient documentation

## 2023-02-17 DIAGNOSIS — M5136 Other intervertebral disc degeneration, lumbar region: Secondary | ICD-10-CM | POA: Insufficient documentation

## 2023-02-17 DIAGNOSIS — M48061 Spinal stenosis, lumbar region without neurogenic claudication: Secondary | ICD-10-CM | POA: Diagnosis not present

## 2023-02-17 NOTE — Patient Instructions (Addendum)
Please get cardiac clearance from Dr Mariah Milling to stop Plavix 7 days prior    ______________________________________________________________________  Preparing for your procedure  Appointments: If you think you may not be able to keep your appointment, call 24-48 hours in advance to cancel. We need time to make it available to others.  During your procedure appointment there will be: No Prescription Refills. No disability issues to discussed. No medication changes or discussions.  Instructions: Food intake: Avoid eating anything solid for at least 8 hours prior to your procedure. Clear liquid intake: You may take clear liquids such as water up to 2 hours prior to your procedure. (No carbonated drinks. No soda.) Transportation: Unless otherwise stated by your physician, bring a driver. Morning Medicines: Except for blood thinners, take all of your other morning medications with a sip of water. Make sure to take your heart and blood pressure medicines. If your blood pressure's lower number is above 100, the case will be rescheduled. Blood thinners: Make sure to stop your blood thinners as instructed.  If you take a blood thinner, but were not instructed to stop it, call our office [phone] and ask to talk to a nurse. Not stopping a blood thinner prior to certain procedures could lead to serious complications. Diabetics on insulin: Notify the staff so that you can be scheduled 1st case in the morning. If your diabetes requires high dose insulin, take only  of your normal insulin dose the morning of the procedure and notify the staff that you have done so. Preventing infections: Shower with an antibacterial soap the morning of your procedure.  Build-up your immune system: Take 1000 mg of Vitamin C with every meal (3 times a day) the day prior to your procedure. Antibiotics: Inform the nursing staff if you are taking any antibiotics or if you have any conditions that may require antibiotics  prior to procedures. (Example: recent joint implants)   Pregnancy: If you are pregnant make sure to notify the nursing staff. Not doing so may result in injury to the fetus, including death.  Sickness: If you have a cold, fever, or any active infections, call and cancel or reschedule your procedure. Receiving steroids while having an infection may result in complications. Arrival: You must be in the facility at least 30 minutes prior to your scheduled procedure. Tardiness: Your scheduled time is also the cutoff time. If you do not arrive at least 15 minutes prior to your procedure, you will be rescheduled.  Children: Do not bring any children with you. Make arrangements to keep them home. Dress appropriately: There is always a possibility that your clothing may get soiled. Avoid long dresses. Valuables: Do not bring any jewelry or valuables.  Reasons to call and reschedule or cancel your procedure: (Following these recommendations will minimize the risk of a serious complication.) Surgeries: Avoid having procedures within 2 weeks of any surgery. (Avoid for 2 weeks before or after any surgery). Flu Shots: Avoid having procedures within 2 weeks of a flu shots or . (Avoid for 2 weeks before or after immunizations). Barium: Avoid having a procedure within 7-10 days after having had a radiological study involving the use of radiological contrast. (Myelograms, Barium swallow or enema study). Heart attacks: Avoid any elective procedures or surgeries for the initial 6 months after a "Myocardial Infarction" (Heart Attack). Blood thinners: It is imperative that you stop these medications before procedures. Let us know if you if you take any blood thinner.  Infection: Avoid procedures during or  within two weeks of an infection (including chest colds or gastrointestinal problems). Symptoms associated with infections include: Localized redness, fever, chills, night sweats or profuse sweating, burning sensation  when voiding, cough, congestion, stuffiness, runny nose, sore throat, diarrhea, nausea, vomiting, cold or Flu symptoms, recent or current infections. It is specially important if the infection is over the area that we intend to treat. Heart and lung problems: Symptoms that may suggest an active cardiopulmonary problem include: cough, chest pain, breathing difficulties or shortness of breath, dizziness, ankle swelling, uncontrolled high or unusually low blood pressure, and/or palpitations. If you are experiencing any of these symptoms, cancel your procedure and contact your primary care physician for an evaluation.  Remember:  Regular Business hours are:  Monday to Thursday 8:00 AM to 4:00 PM  Provider's Schedule: Delano Metz, MD:  Procedure days: Tuesday and Thursday 7:30 AM to 4:00 PM  Edward Jolly, MD:  Procedure days: Monday and Wednesday 7:30 AM to 4:00 PM  ______________________________________________________________________

## 2023-02-17 NOTE — Telephone Encounter (Signed)
Instrructed pt to call back to let her know she has received clearance, per Dr. Cherylann Ratel, from Dr. Mariah Milling to stop Plavix 7 days before procedure and is to substitute with 81 mg Aspiring during that time.

## 2023-02-17 NOTE — Progress Notes (Signed)
PROVIDER NOTE: Information contained herein reflects review and annotations entered in association with encounter. Interpretation of such information and data should be left to medically-trained personnel. Information provided to patient can be located elsewhere in the medical record under "Patient Instructions". Document created using STT-dictation technology, any transcriptional errors that may result from process are unintentional.    Patient: Sarah Payne  Service Category: E/M  Provider: Edward Jolly, MD  DOB: Apr 03, 1946  DOS: 02/17/2023  Referring Provider: Doreene Nest, NP  MRN: [id number]  Specialty: Interventional Pain Management  PCP: Doreene Nest, NP  Type: Established Patient  Setting: Ambulatory outpatient    Location: Office  Delivery: Face-to-face     HPI  [name], a 77 y.o. year old female, is here today because of her Cervical radicular pain [M54.12]. Ms. Vigneault primary complain today is Back Pain (lower) and Shoulder Pain (right)  Pertinent problems: Ms. Sciarra has CAD S/P percutaneous coronary angioplasty; History of lumbar fusion; Uncontrolled type 2 diabetes mellitus with hypoglycemia, with long-term current use of insulin (HCC); Bronchitis, chronic obstructive (HCC); Obstructive sleep apnea; COPD (chronic obstructive pulmonary disease) (HCC); Lumbar degenerative disc disease; Lumbar spondylosis with myelopathy; Cannabis use disorder, mild, abuse; Peripheral polyneuropathy; Tobacco dependence; Chronic pain syndrome; Chronic neck pain; Chronic pain of right upper extremity; Cervical spondylosis; and Chronic musculoskeletal pain on their pertinent problem list. Pain Assessment: Severity of Chronic pain is reported as a 7 /10. Location: Shoulder Right/ . Onset: More than a month ago. Quality: Constant, Aching, Throbbing. Timing: Constant. Modifying factor(s): lying on right side with arm extended, lying on left side with arm extended to the side. Vitals:  height is  5\' 6"  (1.676 m) and weight is 127 lb (57.6 kg). Her temporal temperature is 97.2 F (36.2 C) (abnormal). Her blood pressure is 140/89 (abnormal) and her pulse is 73. Her respiration is 16 and oxygen saturation is 100%.  BMI: Estimated body mass index is 20.5 kg/m as calculated from the following:   Height as of this encounter: 5\' 6"  (1.676 m).   Weight as of this encounter: 127 lb (57.6 kg). Last encounter: 01/27/2023. Last procedure: Visit date not found.  Reason for encounter:  review L-MRI .  Patient presents today to review her cervical and lumbar MRI  She endorses persistent and severe neck pain with radiation into bilateral shoulders and into both arms and hands.  She states that she has trouble holding her iPad and notices numbness and tingling in her fingers.  She also has low back pain that radiates into bilateral legs in a dermatomal fashion.  See MRI results below    ROS  Constitutional: Denies any fever or chills Gastrointestinal: No reported hemesis, hematochezia, vomiting, or acute GI distress Musculoskeletal:  Cervical spine and low back pain Neurological:  Upper and lower extremity paresthesias  Medication Review  DULoxetine, FreeStyle Libre 2 Reader, FreeStyle Libre 2 Sensor, Glucagon, Insulin Pen Needle, albuterol, atorvastatin, carvedilol, clobetasol cream, clopidogrel, ezetimibe, famotidine, furosemide, gabapentin, insulin aspart, isosorbide mononitrate, levothyroxine, mirtazapine, omeprazole, ondansetron, potassium chloride, sitaGLIPtin, and umeclidinium-vilanterol  History Review  Allergy: Ms. Lasswell has No Known Allergies. Drug: Ms. Valladolid  reports current drug use. Drug: Marijuana. Alcohol:  reports no history of alcohol use. Tobacco:  reports that she has been smoking cigarettes. She has a 45 pack-year smoking history. She has never used smokeless tobacco. Social: Ms. Desautel  reports that she has been smoking cigarettes. She has a 45 pack-year smoking history.  She has never used  smokeless tobacco. She reports current drug use. Drug: Marijuana. She reports that she does not drink alcohol. Medical:  has a past medical history of Aortic atherosclerosis (HCC), Bilateral cataracts (01/2015), CHF (congestive heart failure) (HCC) (09/16/2013), Chronic low back pain, Clostridioides difficile infection (2015), COPD (chronic obstructive pulmonary disease) (HCC), Coronary artery disease, Depression, Diverticulosis, Frequent PVCs, GERD (gastroesophageal reflux disease), History of heart artery stent, Hyperlipidemia, Hypertension, Hypothyroidism, Impingement syndrome of left shoulder, LBBB (left bundle branch block), Long term current use of antithrombotics/antiplatelets, Marijuana abuse, Myocardial infarction (HCC), OSA (obstructive sleep apnea), Paroxysmal SVT (supraventricular tachycardia) (05/08/2021), T2DM (type 2 diabetes mellitus) (HCC), Takotsubo cardiomyopathy, Tendonitis of left rotator cuff, Tobacco abuse, and Vascular dementia. Surgical: Ms. Starek  has a past surgical history that includes Back surgery; Appendectomy; Abdominal hysterectomy; Colonoscopy (N/A, 11/02/2014); Cataract extraction w/PHACO (Left, 01/30/2015); Coronary angioplasty with stent (Left, 02/06/2011); Coronary angioplasty with stent (Left, 09/17/2011); Cataract extraction w/PHACO (Right, 02/13/2015); Cardiac catheterization (N/A, 01/01/2016); Esophagogastroduodenoscopy (egd) with propofol (N/A, 09/21/2018); Shoulder surgery (Left, 2017); ABDOMINAL AORTOGRAM W/LOWER EXTREMITY (N/A, 09/12/2020); PERIPHERAL VASCULAR ATHERECTOMY (Left, 09/12/2020); PERIPHERAL VASCULAR BALLOON ANGIOPLASTY (09/12/2020); Coronary angioplasty with stent (Left, 10/02/2011); Coronary angioplasty with stent (Left, 08/19/2012); LEFT HEART CATH AND CORONARY ANGIOGRAPHY (Left, 08/02/2014); Reverse shoulder arthroplasty (Left, 10/01/2021); Eye surgery; and Esophagogastroduodenoscopy (egd) with propofol (N/A, 10/06/2022). Family:  family history includes Colon cancer in her sister; Heart attack in her mother; Heart disease in her father and mother; Liver cancer in her brother; Throat cancer in her brother.  Laboratory Chemistry Profile   Renal Lab Results  Component Value Date   BUN 19 10/28/2022   CREATININE 1.25 (H) 10/28/2022   BCR 14 09/24/2022   GFR 42.42 (L) 10/28/2022   GFRAA 55 (L) 08/30/2020   GFRNONAA 38 (L) 09/11/2022    Hepatic Lab Results  Component Value Date   AST 14 10/28/2022   ALT 9 10/28/2022   ALBUMIN 4.2 10/28/2022   ALKPHOS 86 10/28/2022   HCVAB NON REACTIVE 09/04/2022   AMYLASE 32 04/11/2016   LIPASE 28.0 10/28/2022    Electrolytes Lab Results  Component Value Date   NA 137 10/28/2022   K 3.4 (L) 10/28/2022   CL 101 10/28/2022   CALCIUM 9.3 10/28/2022   MG 1.6 (L) 10/03/2021    Bone Lab Results  Component Value Date   VD25OH 41 02/25/2013    Inflammation (CRP: Acute Phase) (ESR: Chronic Phase) Lab Results  Component Value Date   CRP 2.1 04/11/2013   ESRSEDRATE 58 (H) 04/11/2013         Note: Above Lab results reviewed.  Recent Imaging Review  CLINICAL DATA:  Initial evaluation for chronic neck pain, cervical radiculopathy.   EXAM: MRI CERVICAL SPINE WITHOUT CONTRAST   TECHNIQUE: Multiplanar, multisequence MR imaging of the cervical spine was performed. No intravenous contrast was administered.   COMPARISON:  Prior radiograph from 01/19/2018 as well as previous MRI from 07/09/2012.   FINDINGS: Alignment: Mild straightening of the normal cervical lordosis. Trace degenerative anterolisthesis of C3 on C4.   Vertebrae: Vertebral body height maintained without acute or chronic fracture. Bone marrow signal intensity within normal limits. No discrete or worrisome osseous lesions. Minor reactive endplate changes present about the C4-5 through C7-T1 interspaces. Mild reactive marrow edema present about the right C3-4 facet due to facet arthritis (series 7,  image 2). No other abnormal marrow edema.   Cord: Normal signal and morphology.   Posterior Fossa, vertebral arteries, paraspinal tissues: Unremarkable.   Disc levels:   C2-C3: Small central  disc protrusion mildly indents the ventral thecal sac. Moderate right-sided facet arthrosis. No canal or foraminal stenosis.   C3-C4: Small right paracentral disc protrusion with annular fissure mildly indents the ventral thecal sac (series 9, image 11). Moderate right-sided facet arthrosis. Mild uncovertebral spurring. No more than mild spinal stenosis. Mild right C4 foraminal narrowing. Left neural foramina remains patent.   C4-C5: Degenerative intervertebral disc space narrowing with circumferential disc osteophyte complex. Flattening of the ventral thecal sac with no more than mild spinal stenosis. Severe right worse than left C5 foraminal narrowing.   C5-C6: General vertebral disc space narrowing. Right paracentral disc protrusion with annular fissure indents the ventral thecal sac (series 8, image 19). Right worse than left facet arthrosis. Mild spinal stenosis. Moderate left C6 foraminal narrowing. Right neural foramina remains patent.   C6-C7: Degenerative or acute disc space narrowing with circumferential disc osteophyte complex. Mild facet hypertrophy. No spinal stenosis. Mild bilateral C7 foraminal narrowing.   C7-T1: Degenerative vertebral disc space narrowing with diffuse disc bulge and bilateral uncovertebral spurring. Superimposed small right foraminal disc protrusion (series 8, image 25). Mild right greater left facet hypertrophy. No spinal stenosis. Mild right C8 foraminal narrowing. Left neural foramen remains patent.   IMPRESSION: 1. Multilevel cervical spondylosis with resultant mild diffuse spinal stenosis at C3-4 through C5-6. 2. Multifactorial degenerative changes with resultant multilevel foraminal narrowing as above. Notable findings include severe right worse  than left C5 foraminal stenosis, with moderate left C6 foraminal narrowing. 3. Small right foraminal disc protrusion at C7-T1, potentially affecting the exiting right C8 nerve root. 4. Reactive marrow edema about the right C3-4 facet due to facet arthritis. Finding could serve as a source for neck pain and referred symptoms.     Electronically Signed   By: Rise Mu M.D.   On: 02/12/2023 19:00     MR LUMBAR SPINE WO CONTRAST CLINICAL DATA:  77 year old female with persistent low back pain. Prior surgery.  EXAM: MRI LUMBAR SPINE WITHOUT CONTRAST  TECHNIQUE: Multiplanar, multisequence MR imaging of the lumbar spine was performed. No intravenous contrast was administered.  COMPARISON:  Lumbar radiographs 04/26/2016. Previous MRI 10/14/2012.  FINDINGS: Segmentation: Normal on the comparison radiographs, designating previous L3 through S1 fusion.  Alignment: Degenerative retrolisthesis of L2 on L3 has progressed, up to 5 mm now. Superimposed straightening of lumbar lordosis otherwise stable.  Vertebrae: Chronic fusion hardware susceptibility artifact L3 through S1. Substantially progressed thoracolumbar junction and L1-L2 disc and endplate degeneration from prior exams, with confluent marrow edema now at T12-L1 superimposed on chronic degenerative endplate marrow signal changes there. Background bone marrow signal remains normal. Intact visible sacrum and SI joints. 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  Conus medullaris and cauda equina: Conus extends to the L1 level. No lower spinal cord or conus signal abnormality. Generally normal cauda equina nerve roots.  Paraspinal and other soft tissues: Negative visible abdominal viscera; chronic benign left renal cysts (no follow-up imaging recommended). Postoperative changes to the lower lumbar paraspinal soft tissues with no adverse features.  Disc  levels:  T11-T12: Minimal disc degeneration.  No stenosis.  T12-L1: Substantial disc desiccation and disc space loss since 2014. New circumferential disc bulge or protrusion eccentric to the left. Partially effaced ventral CSF space but no significant spinal stenosis at this time (series 9, image 7). Mild to moderate left and mild right T12 foraminal stenosis is new from the previous MRI.  L1-L2: Substantial disc desiccation and disc space loss from  [employer]. Circumferential disc osteophyte complex. Small L1 inferior endplate Schmorl's node. Mild ligament flavum hypertrophy. No significant spinal stenosis. Mild new bilateral L1 neural foraminal stenosis.  L2-L3: Chronic but progressed disc space loss. Progressed retrolisthesis. Progressed circumferential disc osteophyte complex. Mild facet and ligament flavum hypertrophy. Mild to moderate spinal stenosis (series 9, image 8 and series 12, image 16) is similar to the 2014 MRI. Mild to moderate bilateral L2 neural foraminal stenosis, progressed on the right.  L3-L4:  Chronic decompression and fusion.  No stenosis.  L4-L5:  Chronic decompression and fusion.  No stenosis.  L5-S1: Chronic decompression and fusion. Mild osseous left L5 foraminal stenosis is stable. No other adverse features.  IMPRESSION: 1. Chronic L3 through S1 fusion is stable with no adverse features. 2. Progressed adjacent segment disease at L2-L3 where retrolisthesis and bulky disc osteophyte complex combine for up to moderate spinal and bilateral foraminal stenosis. Mild 3. And moderate to severe chronic disc and endplate degeneration is new T12-L1 and L1-L2. Associated degenerative marrow edema at T12-L1. No significant spinal stenosis at either level but new mild to moderate left T12 and bilateral L1 neural foraminal stenosis.  Electronically Signed   By: Odessa Fleming M.D.   On: 02/16/2023 04:06 Note: Reviewed        Physical Exam  General appearance: Well  nourished, well developed, and well hydrated. In no apparent acute distress Mental status: Alert, oriented x 3 (person, place, & time)       Respiratory: No evidence of acute respiratory distress Eyes: PERLA Vitals: BP (!) 140/89   Pulse 73   Temp (!) 97.2 F (36.2 C) (Temporal)   Resp 16   Ht 5\' 6"  (1.676 m)   Wt 127 lb (57.6 kg)   SpO2 100%   BMI 20.50 kg/m  BMI: Estimated body mass index is 20.5 kg/m as calculated from the following:   Height as of this encounter: 5\' 6"  (1.676 m).   Weight as of this encounter: 127 lb (57.6 kg). Ideal: Ideal body weight: 59.3 kg (130 lb 11.7 oz)  Cervical Spine Area Exam  Skin & Axial Inspection: No masses, redness, edema, swelling, or associated skin lesions Alignment: Symmetrical Functional ROM: Pain restricted ROM      Stability: No instability detected Muscle Tone/Strength: Functionally intact. No obvious neuro-muscular anomalies detected. Sensory (Neurological): Dermatomal pain pattern Palpation: No palpable anomalies             Upper Extremity (UE) Exam    Side: Right upper extremity  Side: Left upper extremity  Skin & Extremity Inspection: Skin color, temperature, and hair growth are WNL. No peripheral edema or cyanosis. No masses, redness, swelling, asymmetry, or associated skin lesions. No contractures.  Skin & Extremity Inspection: Skin color, temperature, and hair growth are WNL. No peripheral edema or cyanosis. No masses, redness, swelling, asymmetry, or associated skin lesions. No contractures.  Functional ROM: Decreased ROM for shoulder  Functional ROM: Decreased ROM for shoulder  Muscle Tone/Strength: Functionally intact. No obvious neuro-muscular anomalies detected.  Muscle Tone/Strength: Functionally intact. No obvious neuro-muscular anomalies detected.  Sensory (Neurological): Dermatomal pain pattern          Sensory (Neurological): Dermatomal pain pattern          Palpation: No palpable anomalies              Palpation: No  palpable anomalies              Provocative Test(s):  Phalen's test: deferred Tinel's test: deferred  Apley's scratch test (touch opposite shoulder):  Action 1 (Across chest): Decreased ROM Action 2 (Overhead): Decreased ROM Action 3 (LB reach): Decreased ROM   Provocative Test(s):  Phalen's test: deferred Tinel's test: deferred Apley's scratch test (touch opposite shoulder):  Action 1 (Across chest): Decreased ROM Action 2 (Overhead): Decreased ROM Action 3 (LB reach): Decreased ROM    Thoracic Spine Area Exam  Skin & Axial Inspection: No masses, redness, or swelling Alignment: Symmetrical Functional ROM: Unrestricted ROM Stability: No instability detected Muscle Tone/Strength: Functionally intact. No obvious neuro-muscular anomalies detected. Sensory (Neurological): Unimpaired Muscle strength & Tone: No palpable anomalies Lumbar Spine Area Exam  Skin & Axial Inspection: Well healed scar from previous spine surgery detected Alignment: Symmetrical Functional ROM: Pain restricted ROM       Stability: No instability detected Muscle Tone/Strength: Functionally intact. No obvious neuro-muscular anomalies detected. Sensory (Neurological): Dermatomal pain pattern Palpation: Complains of area being tender to palpation        Gait & Posture Assessment  Ambulation: Unassisted Gait: Relatively normal for age and body habitus Posture: WNL  Lower Extremity Exam    Side: Right lower extremity  Side: Left lower extremity  Stability: No instability observed          Stability: No instability observed          Skin & Extremity Inspection: Skin color, temperature, and hair growth are WNL. No peripheral edema or cyanosis. No masses, redness, swelling, asymmetry, or associated skin lesions. No contractures.  Skin & Extremity Inspection: Skin color, temperature, and hair growth are WNL. No peripheral edema or cyanosis. No masses, redness, swelling, asymmetry, or associated skin lesions. No  contractures.  Functional ROM: Unrestricted ROM                  Functional ROM: Unrestricted ROM                  Muscle Tone/Strength: Functionally intact. No obvious neuro-muscular anomalies detected.  Muscle Tone/Strength: Functionally intact. No obvious neuro-muscular anomalies detected.  Sensory (Neurological): Neurogenic pain pattern        Sensory (Neurological): Neurogenic pain pattern        DTR: Patellar: deferred today Achilles: deferred today Plantar: deferred today  DTR: Patellar: deferred today Achilles: deferred today Plantar: deferred today  Palpation: No palpable anomalies  Palpation: No palpable anomalies    Assessment   Diagnosis Status  1. Cervical radicular pain   2. Cervicalgia   3. Spinal stenosis, lumbar region, with neurogenic claudication   4. Chronic radicular lumbar pain   5. Failed back surgical syndrome   6. 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  We reviewed her cervical and lumbar MRI in great detail.  We also discussed spinal cord stimulation for her lower back.  Orders:  Orders Placed This Encounter  Procedures   Cervical Epidural Injection    Sedation: Patient's choice. Purpose: Diagnostic/Therapeutic Indication(s): Radiculitis and cervicalgia associater with cervical degenerative disc disease.    Standing Status:   Future    Standing Expiration Date:   05/20/2023    Scheduling Instructions:     Procedure: Cervical Epidural Steroid Injection/Block     Level(s): C7-T1     Laterality: TBD     Timeframe:  As soon as schedule allows    Order Specific Question:   Where will this procedure be performed?    Answer:   [facility]    Comments:   Nakiyah Beverley    Lumbar Epidural Injection    Standing Status:   Future    Standing Expiration Date:   05/20/2023    Scheduling Instructions:     Procedure: Interlaminar Lumbar Epidural Steroid injection (LESI)            Laterality: Midline     Sedation: without     Timeframe: ASAA          Stop Plavix 7 days prior    Order Specific Question:   Where will this procedure be performed?    Answer:   [facility]   Follow-up plan:   Return in about 13 days (around 03/02/2023) for C-ESI and L2/3 ESI, in clinic NS, stop Plavix 7 days prior.      Recent Visits Date Type Provider Dept  01/27/23 Office Visit Edward Jolly, MD [facility]  Showing recent visits within past 90 days and meeting all other requirements Today's Visits Date Type Provider Dept  02/17/23 Office Visit Edward Jolly, MD [facility]  Showing today's visits and meeting all other requirements Future Appointments No visits were found meeting these conditions. Showing future appointments within next 90 days and meeting all other requirements  I discussed the assessment and treatment plan with the patient. The patient was provided an opportunity to ask questions and all were answered. The patient agreed with the plan and demonstrated an understanding of the instructions.  Patient advised to call back or seek an in-person evaluation if the symptoms or condition worsens.  Duration of encounter: .  Total time on encounter, as per AMA guidelines included both the face-to-face and non-face-to-face time personally spent by the physician and/or other qualified health care professional(s) on the day of the encounter (includes time in activities that require the physician or other qualified health care professional and does not include time in activities normally performed by clinical staff). Physician's time may include the following activities when performed: Preparing to see the patient (e.g., pre-charting  review of records, searching for previously ordered imaging, lab work, and nerve conduction tests) Review of prior analgesic pharmacotherapies. Reviewing PMP Interpreting ordered tests (e.g., lab work, imaging, nerve conduction tests) Performing post-procedure evaluations, including interpretation of diagnostic procedures Obtaining and/or reviewing separately obtained history Performing a medically appropriate examination and/or evaluation Counseling and educating the patient/family/caregiver Ordering medications, tests, or procedures Referring and communicating with other health care professionals (when not separately reported) Documenting clinical information in the electronic or other health record Independently interpreting results (not separately reported) and communicating results to the patient/ family/caregiver Care coordination (not separately reported)  Note by: Edward Jolly, MD Date: 02/17/2023; Time: 11:31 AM

## 2023-02-18 ENCOUNTER — Encounter (INDEPENDENT_AMBULATORY_CARE_PROVIDER_SITE_OTHER): Payer: Self-pay

## 2023-02-18 NOTE — Telephone Encounter (Signed)
Spoke with patient, instructed her to stop Plavix 7 days before scheduled procedure.

## 2023-02-20 ENCOUNTER — Other Ambulatory Visit: Payer: Self-pay | Admitting: Primary Care

## 2023-02-20 DIAGNOSIS — E1151 Type 2 diabetes mellitus with diabetic peripheral angiopathy without gangrene: Secondary | ICD-10-CM

## 2023-02-23 NOTE — Telephone Encounter (Signed)
Prescription Request  02/23/2023  LOV: 10/31/2022  What is the name of the medication or equipment? insulin aspart (NOVOLOG FLEXPEN) 100 UNIT/ML FlexPen   Have you contacted your pharmacy to request a refill? Yes  [id number] - [place], [place] - [address]   Which pharmacy would you like this sent to?  Patient notified that their request is being sent to the clinical staff for review and that they should receive a response within 2 business days.   Please advise at [facility] [phone]

## 2023-03-03 ENCOUNTER — Encounter: Payer: Medicare PPO | Admitting: Pharmacist

## 2023-03-03 ENCOUNTER — Ambulatory Visit (INDEPENDENT_AMBULATORY_CARE_PROVIDER_SITE_OTHER): Payer: Medicare PPO | Admitting: Primary Care

## 2023-03-03 ENCOUNTER — Encounter: Payer: Self-pay | Admitting: Primary Care

## 2023-03-03 VITALS — BP 142/80 | HR 68 | Temp 97.8°F | Wt 123.2 lb

## 2023-03-03 DIAGNOSIS — M549 Dorsalgia, unspecified: Secondary | ICD-10-CM | POA: Diagnosis not present

## 2023-03-03 DIAGNOSIS — F4323 Adjustment disorder with mixed anxiety and depressed mood: Secondary | ICD-10-CM

## 2023-03-03 DIAGNOSIS — Z0000 Encounter for general adult medical examination without abnormal findings: Secondary | ICD-10-CM | POA: Diagnosis not present

## 2023-03-03 DIAGNOSIS — M542 Cervicalgia: Secondary | ICD-10-CM | POA: Diagnosis not present

## 2023-03-03 DIAGNOSIS — I2510 Atherosclerotic heart disease of native coronary artery without angina pectoris: Secondary | ICD-10-CM

## 2023-03-03 DIAGNOSIS — I10 Essential (primary) hypertension: Secondary | ICD-10-CM

## 2023-03-03 DIAGNOSIS — E11649 Type 2 diabetes mellitus with hypoglycemia without coma: Secondary | ICD-10-CM

## 2023-03-03 DIAGNOSIS — I5181 Takotsubo syndrome: Secondary | ICD-10-CM

## 2023-03-03 DIAGNOSIS — E782 Mixed hyperlipidemia: Secondary | ICD-10-CM | POA: Diagnosis not present

## 2023-03-03 DIAGNOSIS — E039 Hypothyroidism, unspecified: Secondary | ICD-10-CM | POA: Diagnosis not present

## 2023-03-03 DIAGNOSIS — J432 Centrilobular emphysema: Secondary | ICD-10-CM

## 2023-03-03 DIAGNOSIS — Z794 Long term (current) use of insulin: Secondary | ICD-10-CM

## 2023-03-03 DIAGNOSIS — F330 Major depressive disorder, recurrent, mild: Secondary | ICD-10-CM | POA: Diagnosis not present

## 2023-03-03 DIAGNOSIS — D126 Benign neoplasm of colon, unspecified: Secondary | ICD-10-CM

## 2023-03-03 DIAGNOSIS — G8929 Other chronic pain: Secondary | ICD-10-CM

## 2023-03-03 DIAGNOSIS — I5022 Chronic systolic (congestive) heart failure: Secondary | ICD-10-CM

## 2023-03-03 DIAGNOSIS — G4700 Insomnia, unspecified: Secondary | ICD-10-CM | POA: Diagnosis not present

## 2023-03-03 DIAGNOSIS — L900 Lichen sclerosus et atrophicus: Secondary | ICD-10-CM

## 2023-03-03 DIAGNOSIS — K219 Gastro-esophageal reflux disease without esophagitis: Secondary | ICD-10-CM

## 2023-03-03 DIAGNOSIS — E1151 Type 2 diabetes mellitus with diabetic peripheral angiopathy without gangrene: Secondary | ICD-10-CM

## 2023-03-03 DIAGNOSIS — E2839 Other primary ovarian failure: Secondary | ICD-10-CM

## 2023-03-03 LAB — BASIC METABOLIC PANEL
BUN: 20 mg/dL (ref 6–23)
CO2: 29 mEq/L (ref 19–32)
Calcium: 9.6 mg/dL (ref 8.4–10.5)
Chloride: 101 mEq/L (ref 96–112)
Creatinine, Ser: 1.2 mg/dL (ref 0.40–1.20)
GFR: 44.44 mL/min — ABNORMAL LOW (ref >60.00)
Glucose, Bld: 118 mg/dL — ABNORMAL HIGH (ref 70–99)
Potassium: 4 mEq/L (ref 3.5–5.1)
Sodium: 137 mEq/L (ref 135–145)

## 2023-03-03 LAB — POCT GLYCOSYLATED HEMOGLOBIN (HGB A1C): Hemoglobin A1C: 8.1 % — AB (ref 4.0–5.6)

## 2023-03-03 LAB — TSH: TSH: 0.07 u[IU]/mL — ABNORMAL LOW (ref 0.35–5.50)

## 2023-03-03 LAB — LIPID PANEL
Cholesterol: 171 mg/dL (ref 0–200)
HDL: 46.5 mg/dL (ref >39.00)
LDL Cholesterol: 93 mg/dL (ref 0–99)
NonHDL: 124.38
Total CHOL/HDL Ratio: 4
Triglycerides: 158 mg/dL — ABNORMAL HIGH (ref 0.0–149.0)
VLDL: 31.6 mg/dL (ref 0.0–40.0)

## 2023-03-03 MED ORDER — UMECLIDINIUM-VILANTEROL 62.5-25 MCG/ACT IN AEPB
1.0000 | INHALATION_SPRAY | Freq: Every day | RESPIRATORY_TRACT | 5 refills | Status: DC
Start: 2023-03-03 — End: 2023-09-07

## 2023-03-03 MED ORDER — MIRTAZAPINE 15 MG PO TABS: 15.0000 mg | ORAL_TABLET | Freq: Every day | ORAL | 3 refills | Status: DC

## 2023-03-03 NOTE — Patient Instructions (Addendum)
Stop by the lab prior to leaving today. I will notify you of your results once received.   [facility] density [facility].   We increased the dose of your mirtazapine to 15 mg at night.  This will help with sleep, depression, appetite.  Continue taking 7.5 mg at bedtime for another 2 weeks, then increase to 15 mg thereafter.  Please schedule a follow up visit for 3 months.  It was a pleasure to see you today!

## 2023-03-03 NOTE — Progress Notes (Signed)
Subjective:    Patient ID: Sarah Payne, female    DOB: [birth year], 77 y.o.   MRN: [id number]  HPI  Sarah Payne is a very pleasant 77 y.o. female with a significant medical history including CAD, CHF, uncontrolled type 2 diabetes with hypoglycemia and hyperglycemia, COPD, OSA, hypothyroidism, CKD, chronic pain syndrome, hyperlipidemia, diabetic neuropathy chronic bronchitis, insomnia, tobacco dependence who presents today for complete physical and follow up of chronic conditions.  Immunizations: -Tetanus: Completed in 2014 -Shingles: Completed Shingrix series -Pneumonia: Completed Prevnar 13 in 2015, Pneumovax 23 in 2020  Diet: Fair diet.  Exercise: No regular exercise.  Eye exam: Completes annually  Dental exam: Completes semi-annually    Mammogram: Completed years ago, declines  Bone Density Scan: Completed years ago  Colonoscopy: Completed in 2016, due 2019 and never completed. Declined last year, declines this year. Lung Cancer Screening: Completed in October 2022, underwent CT chest/abdomen/pelvis in February 2024   Wt Readings from Last 3 Encounters:  03/03/23 123 lb 3.2 oz (55.9 kg)  02/17/23 127 lb (57.6 kg)  01/27/23 133 lb (60.3 kg)   BP Readings from Last 3 Encounters:  03/03/23 (!) 142/80  02/17/23 (!) 140/89  01/27/23 (!) 137/97      Review of Systems  Constitutional:  Negative for unexpected weight change.  HENT:  Negative for rhinorrhea.   Respiratory:  Negative for cough and shortness of breath.   Cardiovascular:  Negative for chest pain.  Gastrointestinal:  Negative for constipation and diarrhea.  Genitourinary:  Negative for difficulty urinating.  Musculoskeletal:  Positive for arthralgias.  Skin:  Negative for rash.  Allergic/Immunologic: Negative for environmental allergies.  Neurological:  Negative for dizziness and headaches.  Psychiatric/Behavioral:  The patient is nervous/anxious.          Past Medical History:  Diagnosis Date    Acute pain of left shoulder 01/17/2019   Acute renal failure superimposed on stage 3b chronic kidney disease (HCC) 09/03/2022   Aortic atherosclerosis (HCC)    Bilateral cataracts 01/2015   a.) s/p extraction   CHF (congestive heart failure) (HCC) 09/16/2013   a.) TTE 09/16/2013: EF 55-60%; mod inferior HK, triv TR; G1DD. b.) TTE 01/02/2016: EF 25-30%; mid-apicalateroseptal, lat, inf, inferoseptal, apical akinesis; triv TR; G1DD. c. TTE 04/16/2016: EF 65%; G1DD. d.) TTE 05/02/2020: EF 50-55%; G2DD. e.) TTE 04/11/2021: EF 55%; G1DD.   Chronic low back pain    Clostridioides difficile infection 2015   COPD (chronic obstructive pulmonary disease) (HCC)    Coronary artery disease    a.) PCI 02/06/2011: 100% mLCx - DES x 3. b.) PCI 09/17/2011: 95% dRCA - DES x 1. c.) PCI 10/02/2011: 75% pLCX - DES x 1. d.) PCI 08/19/2012: DES x 2 p-mRCA.  e.) LHC 01/01/16: Takotsubo event 01/01/2016 with patent stents   Depression    Diverticulosis    Frequent PVCs    a. Noted in hospital 12/2015.   GERD (gastroesophageal reflux disease)    History of heart artery stent    a.) TOTAL of 7 stents: a.) 02/06/2011 - overlapping 2.5x12 mm Xience V, 2.5x8 mm Xience V, 2.25x8 mm Xience Nano to mLCx. b.) 09/17/2011 - 2.5x23 Xience V dRCA. c.) 10/02/2011 - 2.5x15 mm Xience V pLCx. d.) 08/09/2012 - overlapping 3.0x22mm mRCA and 3.5x34mm pRCA   Hyperlipidemia    Hypertension    Hypothyroidism    Impingement syndrome of left shoulder    LBBB (left bundle branch block)    Left flank pain 10/28/2022   Long  term current use of antithrombotics/antiplatelets    a.) clopidogrel   Marijuana abuse    Myocardial infarction [facility])    a.) multiple MIs;  5 per her report   OSA (obstructive sleep apnea)    a.) mild; does not require nocturnal PAP therapy   Pancreatitis 09/19/2022   Paroxysmal SVT (supraventricular tachycardia) 05/08/2021   a.)  Zio patch 05/08/2021: 3 distinct SVT runs; fastest 5 beats at a rate of 146 bpm; longest  7 beats at rate of 134 bpm   T2DM (type 2 diabetes mellitus) (HCC)    Takotsubo cardiomyopathy    a. 12/2015 - nephew committed suicide 1 week prior, sister died the morning of presentation - initially called a STEMI; cath with patent stents. LVEF 25-30%.   Tendonitis of left rotator cuff    Tobacco abuse    Vaginal burning 03/22/2019   Vascular dementia     Social History   Socioeconomic History   Marital status: Married    Spouse name: Not on file   Number of children: Not on file   Years of education: Not on file   Highest education level: Not on file  Occupational History   Not on file  Tobacco Use   Smoking status: Every Day    Current packs/day: 1.00    Average packs/day: 1 pack/day for 45.0 years (45.0 ttl pk-yrs)    Types: Cigarettes   Smokeless tobacco: Never   Tobacco comments:    Has cut back, trying to quit.   Vaping Use   Vaping status: Never Used  Substance and Sexual Activity   Alcohol use: No    Alcohol/week: 0.0 standard drinks of alcohol   Drug use: Yes    Types: Marijuana    Comment: last noc   Sexual activity: Not on file  Other Topics Concern   Not on file  Social History Narrative   Lives at home with her husband in [place].  Previously used marijuana - quit.      Regular exercise: no/ pain from a frozen rotator cuff   Caffeine use: coffee daily and pepsi      Does not have a living will.   Daughters and husband know her wishes- would desire CPR but not prolonged life support if futile   Social Determinants of Health   Financial Resource Strain: Low Risk  (08/06/2022)   Overall Financial Resource Strain (CARDIA)    Difficulty of Paying Living Expenses: Not hard at all  Food Insecurity: No Food Insecurity (11/04/2022)   Hunger Vital Sign    Worried About Running Out of Food in the Last Year: Never true    Ran Out of Food in the Last Year: Never true  Transportation Needs: No Transportation Needs (11/04/2022)   PRAPARE - Therapist, art (Medical): No    Lack of Transportation (Non-Medical): No  Physical Activity: Inactive (08/06/2022)   Exercise Vital Sign    Days of Exercise per Week: 0 days    Minutes of Exercise per Session: 0 min  Stress: No Stress Concern Present (08/06/2022)   [employer] of Occupational Health - Occupational Stress Questionnaire    Feeling of Stress : Only a little  Social Connections: Socially Isolated (08/06/2022)   Social Connection and Isolation Panel [NHANES]    Frequency of Communication with Friends and Family: Never    Frequency of Social Gatherings with Friends and Family: Never    Attends Religious Services: Never    Active Member  of Clubs or Organizations: No    Attends Banker Meetings: Never    Marital Status: Married  Catering manager Violence: Not At Risk (09/04/2022)   Humiliation, Afraid, Rape, and Kick questionnaire    Fear of Current or Ex-Partner: No    Emotionally Abused: No    Physically Abused: No    Sexually Abused: No    Past Surgical History:  Procedure Laterality Date   ABDOMINAL AORTOGRAM W/LOWER EXTREMITY N/A 09/12/2020   Procedure: ABDOMINAL AORTOGRAM W/LOWER EXTREMITY;  Surgeon: Iran Ouch, MD;  Location: [facility] INVASIVE CV LAB;  Service: Cardiovascular;  Laterality: N/A;   ABDOMINAL HYSTERECTOMY     APPENDECTOMY     BACK SURGERY     CARDIAC CATHETERIZATION N/A 01/01/2016   Procedure: Left Heart Cath and Coronary Angiography;  Surgeon: Corky Crafts, MD;  Location: [facility] INVASIVE CV LAB;  Service: Cardiovascular;  Laterality: N/A;   CATARACT EXTRACTION W/PHACO Left 01/30/2015   Procedure: CATARACT EXTRACTION PHACO AND INTRAOCULAR LENS PLACEMENT (IOC);  Surgeon: Galen Manila, MD;  Location: [facility] ORS;  Service: Ophthalmology;  Laterality: Left;  [place] 00:47    CATARACT EXTRACTION W/PHACO Right 02/13/2015   Procedure: CATARACT EXTRACTION PHACO AND INTRAOCULAR LENS PLACEMENT (IOC);  Surgeon: Galen Manila, MD;   Location: [facility] ORS;  Service: Ophthalmology;  Laterality: Right;  cassette lot # [id number] H [place]  00:29.9 AP  20.7 CDE  6.20   COLONOSCOPY N/A 11/02/2014   Procedure: COLONOSCOPY;  Surgeon: Louis Meckel, MD;  Location: [facility] ENDOSCOPY;  Service: Endoscopy;  Laterality: N/A;   CORONARY ANGIOPLASTY WITH STENT PLACEMENT Left 02/06/2011   Procedure: CORONARY ANGIOPLASTY WITH STENT PLACEMENT; Location: [facility]; Surgeon: Rudean Hitt, MD   CORONARY ANGIOPLASTY WITH STENT PLACEMENT Left 09/17/2011   Procedure: CORONARY ANGIOPLASTY WITH STENT PLACEMENT; Location: [facility]; Surgeon: Rudean Hitt, MD   CORONARY ANGIOPLASTY WITH STENT PLACEMENT Left 10/02/2011   Procedure: CORONARY ANGIOPLASTY WITH STENT PLACEMENT; Location: [facility]; Surgeon: Rudean Hitt, MD   CORONARY ANGIOPLASTY WITH STENT PLACEMENT Left 08/19/2012   Procedure: CORONARY ANGIOPLASTY WITH STENT PLACEMENT; Location: [facility]; Surgeon: Lorine Bears, MD   ESOPHAGOGASTRODUODENOSCOPY (EGD) WITH PROPOFOL N/A 09/21/2018   Procedure: ESOPHAGOGASTRODUODENOSCOPY (EGD) WITH PROPOFOL;  Surgeon: Wyline Mood, MD;  Location: [facility] ENDOSCOPY;  Service: Gastroenterology;  Laterality: N/A;   ESOPHAGOGASTRODUODENOSCOPY (EGD) WITH PROPOFOL N/A 10/06/2022   Procedure: ESOPHAGOGASTRODUODENOSCOPY (EGD) WITH PROPOFOL;  Surgeon: Wyline Mood, MD;  Location: [facility] ENDOSCOPY;  Service: Gastroenterology;  Laterality: N/A;   EYE SURGERY     LEFT HEART CATH AND CORONARY ANGIOGRAPHY Left 08/02/2014   Procedure: LEFT HEART CATH AND CORONARY ANGIOGRAPHY; Location: [facility]; Surgeon: Bryan Lemma, MD   PERIPHERAL VASCULAR ATHERECTOMY Left 09/12/2020   Procedure: PERIPHERAL VASCULAR ATHERECTOMY;  Surgeon: Iran Ouch, MD;  Location: [facility] INVASIVE CV LAB;  Service: Cardiovascular;  Laterality: Left;   PERIPHERAL VASCULAR BALLOON ANGIOPLASTY  09/12/2020   Procedure: PERIPHERAL VASCULAR BALLOON ANGIOPLASTY;  Surgeon: Iran Ouch, MD;  Location: [facility] INVASIVE CV LAB;  Service:  Cardiovascular;;   REVERSE SHOULDER ARTHROPLASTY Left 10/01/2021   Procedure: REVERSE SHOULDER ARTHROPLASTY WITH BICEPS TENODESIS.;  Surgeon: Christena Flake, MD;  Location: [facility] ORS;  Service: Orthopedics;  Laterality: Left;   SHOULDER SURGERY Left 2017    Family History  Problem Relation Age of Onset   Heart attack Mother        First MI @ 68 - Died @ 56   Heart disease Mother    Heart disease Father        Died @  90   Throat cancer Brother    Liver cancer Brother    Colon cancer Sister     No Known Allergies  Current Outpatient Medications on File Prior to Visit  Medication Sig Dispense Refill   albuterol (VENTOLIN HFA) 108 (90 Base) MCG/ACT inhaler Inhale 2 puffs into the lungs every 6 (six) hours as needed for wheezing or shortness of breath. 8 g 0   atorvastatin (LIPITOR) 80 MG tablet Take 80 mg by mouth daily.     carvedilol (COREG) 3.125 MG tablet Take 1 tablet (3.125 mg total) by mouth 2 (two) times daily. 180 tablet 3   clobetasol cream (TEMOVATE) 0.05 % Apply 1 Application topically 2 (two) times daily as needed. For vaginal itching. 30 g 0   clopidogrel (PLAVIX) 75 MG tablet Take 1 tablet (75 mg total) by mouth daily. 90 tablet 3   Continuous Blood Gluc Receiver (FREESTYLE LIBRE 2 READER) DEVI Use with sensor to check blood sugars 6 times daily. 1 each 0   Continuous Glucose Sensor (FREESTYLE LIBRE 2 SENSOR) MISC USE ONE SENSOR EVERY 14 DAYS TO CHECK BLOOD SUGAR SIX TIMES PER DAY 6 each 1   DULoxetine (CYMBALTA) 60 MG capsule Take 60 mg by mouth daily.     ezetimibe (ZETIA) 10 MG tablet Take 1 tablet (10 mg total) by mouth daily. For cholesterol. 90 tablet 3   famotidine (PEPCID) 20 MG tablet Take 1 tablet (20 mg total) by mouth at bedtime. For nausea and acid 90 tablet 0   furosemide (LASIX) 20 MG tablet Take 1 tablet (20 mg total) by mouth as needed (As needed for weight gain greater than 3 pounds overnight or increased shortness of breath.). Take one tab only on Monday,  Wednesday, & Fridays 90 tablet 3   gabapentin (NEURONTIN) 300 MG capsule TAKE 1 CAPSULE BY MOUTH TWICE DAILY FOR PAIN 180 capsule 0   Glucagon (GVOKE HYPOPEN 2-PACK) 1 MG/0.2ML SOAJ Inject 1 mg (1 pen) as needed for severe low blood sugar (sustained glucose less than 55 despite oral glucose treatments). May repeat in 15 minutes as needed. 0.4 mL 1   insulin aspart (NOVOLOG FLEXPEN) 100 UNIT/ML FlexPen Inject 6 to 16 units three times daily with meals per sliding scale for diabetes 45 mL 0   Insulin Pen Needle (INSUPEN PEN NEEDLES) 32G X 4 MM MISC Use to inject insulin 4 times daily. 250 each 1   isosorbide mononitrate (IMDUR) 30 MG 24 hr tablet Take 30 mg by mouth daily.     levothyroxine (SYNTHROID) 100 MCG tablet Take 1 tablet by mouth every morning on an empty stomach with water only.  No food or other medications for 30 minutes. 90 tablet 0   omeprazole (PRILOSEC) 40 MG capsule Take 1 capsule (40 mg total) by mouth daily. 90 capsule 3   ondansetron (ZOFRAN) 4 MG tablet Take 1 tablet (4 mg total) by mouth every 6 (six) hours as needed for nausea. 15 tablet 0   ondansetron (ZOFRAN-ODT) 4 MG disintegrating tablet Take 1 tablet (4 mg total) by mouth every 8 (eight) hours as needed for nausea or vomiting. 20 tablet 0   potassium chloride (KLOR-CON M) 10 MEQ tablet Only take on days you take your lasix. Take 1 tablet (10 mEq total) by mouth for 1 dose on those days you take your lasix 20mg  pill. 90 tablet 3   sitaGLIPtin (JANUVIA) 100 MG tablet Take 100 mg by mouth daily.     No current facility-administered medications  on file prior to visit.    BP (!) 142/80 (BP Location: Left Arm, Patient Position: Sitting, Cuff Size: Normal)   Pulse 68   Temp 97.8 F (36.6 C) (Temporal)   Wt 123 lb 3.2 oz (55.9 kg)   SpO2 98%   BMI 19.89 kg/m  Objective:   Physical Exam HENT:     Right Ear: Tympanic membrane and ear canal normal.     Left Ear: Tympanic membrane and ear canal normal.     Nose: Nose  normal.  Eyes:     Conjunctiva/sclera: Conjunctivae normal.     Pupils: Pupils are equal, round, and reactive to light.  Neck:     Thyroid: No thyromegaly.  Cardiovascular:     Rate and Rhythm: Normal rate and regular rhythm.     Heart sounds: No murmur heard. Pulmonary:     Effort: Pulmonary effort is normal.     Breath sounds: Normal breath sounds. No rales.  Abdominal:     General: Bowel sounds are normal.     Palpations: Abdomen is soft.     Tenderness: There is no abdominal tenderness.  Musculoskeletal:     Right shoulder: Decreased range of motion.     Cervical back: Neck supple.  Lymphadenopathy:     Cervical: No cervical adenopathy.  Skin:    General: Skin is warm and dry.     Findings: No rash.  Neurological:     Mental Status: She is alert and oriented to person, place, and time.     Cranial Nerves: No cranial nerve deficit.     Deep Tendon Reflexes: Reflexes are normal and symmetric.  Psychiatric:        Mood and Affect: Mood normal.           Assessment & Plan:  Diabetes mellitus type 2 with peripheral artery disease (HCC) -     POCT glycosylated hemoglobin (Hb A1C)  Chronic bilateral back pain, unspecified back location Assessment & Plan: Following with pain management. Reviewed MR cervical and lumbar spine.  Continue gabapentin 300 mg BID, Cymbalta 60 mg daily. Proceed with injection that is pending tomorrow.   Chronic neck pain Assessment & Plan: Following with pain management. Reviewed MR cervical spine from July 2024.  Continue gabapentin 300 mg BID, Cymbalta 60 mg daily. Proceed with cervical spine injection as planned.   Insomnia, unspecified type Assessment & Plan: Improving, not quite at goal.  Increase mirtazapine to 15 mg HS. She will update.  Orders: -     Mirtazapine; Take 1 tablet (15 mg total) by mouth at bedtime. For sleep and depression  Dispense: 90 tablet; Refill: 3  Mixed hyperlipidemia Assessment & Plan: Repeat  lipid panel pending.  Continue atorvastatin 80 mg daily, Zetia 10 mg daily.  Orders: -     Lipid panel -     Basic metabolic panel  Mild episode of recurrent major depressive disorder (HCC) Assessment & Plan: Chronic and continued, mostly family stress.  Continue Cymbalta 60 mg daily. Increase mirtazapine to 15 mg HS.  She will update.    Orders: -     Mirtazapine; Take 1 tablet (15 mg total) by mouth at bedtime. For sleep and depression  Dispense: 90 tablet; Refill: 3  Adjustment disorder with mixed anxiety and depressed mood Assessment & Plan: Chronic and continued. Slightly improved.  Continue Cymbalta 60 mg daily. Increase mirtazapine to 15 mg HS.   She is excited about an upcoming beach trip.  She will update.  Hypothyroidism, unspecified type Assessment & Plan: She is taking levothyroxine correctly. Continue levothyroxine 100 mcg daily. Repeat TSH pending.  Orders: -     TSH  Estrogen deficiency -     DG Bone Density; Future  Centrilobular emphysema (HCC) Assessment & Plan: Controlled.  Reviewed CT chest/abdomen/pelvis from February 2024. Will renew lung cancer screening program next year.  Continue Anoro Ellipta 62.5-25 mcg, 1 puff daily.  Refills provided Continue albuterol inhaler as needed for which she uses sparingly.  Orders: -     Umeclidinium-Vilanterol; Inhale 1 puff into the lungs daily.  Dispense: 60 each; Refill: 5  Gastroesophageal reflux disease, unspecified whether esophagitis present Assessment & Plan: Controlled.  Continue omeprazole 40 mg daily, famotidine 20 mg daily.   Uncontrolled type 2 diabetes mellitus with hypoglycemia, with long-term current use of insulin (HCC) Assessment & Plan: Slight deterioration with A1c of 8.1 today.  Working with pharmacy to prevent recurrent hypoglycemia events. Continue NovoLog 70/30 6 to 16 units 3 times daily per sliding scale, Januvia 100 mg daily.  Close follow-up in 3  months.   Takotsubo cardiomyopathy Assessment & Plan: Follow with cardiology, office notes reviewed from February 2024.     Chronic systolic congestive heart failure [facility]) Assessment & Plan: Follow with cardiology, office notes reviewed from February 2024. Reviewed lower extremity flow studies from March 2024.  Continue furosemide 20 mg as needed. Continue carvedilol 3.125 mg twice daily, continue Imdur 30 mg daily   CAD S/P percutaneous coronary angioplasty Assessment & Plan: Asymptomatic.  Following with cardiology, office notes reviewed from February 2024. Continue Imdur 30 mg daily, carvedilol 3.125 mg twice daily, atorvastatin 80 mg daily, clopidogrel 75 mg daily, Zetia 10 mg daily.   Primary hypertension Assessment & Plan: Above goal today, home readings are controlled per patient.  Continue carvedilol 3.125 mg twice daily.   Lichen sclerosus et atrophicus Assessment & Plan: Controlled.  Continue complete assault 0.05% cream as needed.   Preventative health care Assessment & Plan: Immunizations UTD. Mammogram due, she declines Bone density scan due, orders placed Colonoscopy due, she declines.  Discussed the importance of a healthy diet and regular exercise in order for weight loss, and to reduce the risk of further co-morbidity.  Exam stable. Labs pending.  Follow up in 1 year for repeat physical.    Benign neoplasm of colon, unspecified part of colon Assessment & Plan: Declines colonoscopy despite recommendations. She will follow-up with GI as scheduled.         Doreene Nest, NP

## 2023-03-03 NOTE — Assessment & Plan Note (Signed)
Repeat lipid panel pending.  Continue atorvastatin 80 mg daily, Zetia 10 mg daily.

## 2023-03-03 NOTE — Assessment & Plan Note (Signed)
Following with pain management. Reviewed MR cervical and lumbar spine.  Continue gabapentin 300 mg BID, Cymbalta 60 mg daily. Proceed with injection that is pending tomorrow.

## 2023-03-03 NOTE — Assessment & Plan Note (Signed)
Chronic and continued, mostly family stress.  Continue Cymbalta 60 mg daily. Increase mirtazapine to 15 mg HS.  She will update.

## 2023-03-03 NOTE — Assessment & Plan Note (Signed)
Declines colonoscopy despite recommendations. She will follow-up with GI as scheduled.

## 2023-03-03 NOTE — Assessment & Plan Note (Signed)
Follow with cardiology, office notes reviewed from February 2024. Reviewed lower extremity flow studies from March 2024.  Continue furosemide 20 mg as needed. Continue carvedilol 3.125 mg twice daily, continue Imdur 30 mg daily

## 2023-03-03 NOTE — Assessment & Plan Note (Signed)
Controlled.  Continue omeprazole 40 mg daily, famotidine 20 mg daily.

## 2023-03-03 NOTE — Assessment & Plan Note (Signed)
Immunizations UTD. Mammogram due, she declines Bone density scan due, orders placed Colonoscopy due, she declines.  Discussed the importance of a healthy diet and regular exercise in order for weight loss, and to reduce the risk of further co-morbidity.  Exam stable. Labs pending.  Follow up in 1 year for repeat physical.

## 2023-03-03 NOTE — Assessment & Plan Note (Signed)
Follow with cardiology, office notes reviewed from February 2024.

## 2023-03-03 NOTE — Assessment & Plan Note (Signed)
Above goal today, home readings are controlled per patient.  Continue carvedilol 3.125 mg twice daily.

## 2023-03-03 NOTE — Assessment & Plan Note (Signed)
Following with pain management. Reviewed MR cervical spine from July 2024.  Continue gabapentin 300 mg BID, Cymbalta 60 mg daily. Proceed with cervical spine injection as planned.

## 2023-03-03 NOTE — Assessment & Plan Note (Signed)
Chronic and continued. Slightly improved.  Continue Cymbalta 60 mg daily. Increase mirtazapine to 15 mg HS.   She is excited about an upcoming beach trip.  She will update.

## 2023-03-03 NOTE — Assessment & Plan Note (Signed)
Controlled.  Continue complete assault 0.05% cream as needed.

## 2023-03-03 NOTE — Assessment & Plan Note (Signed)
Slight deterioration with A1c of 8.1 today.  Working with pharmacy to prevent recurrent hypoglycemia events. Continue NovoLog 70/30 6 to 16 units 3 times daily per sliding scale, Januvia 100 mg daily.  Close follow-up in 3 months.

## 2023-03-03 NOTE — Assessment & Plan Note (Signed)
Asymptomatic.  Following with cardiology, office notes reviewed from February 2024. Continue Imdur 30 mg daily, carvedilol 3.125 mg twice daily, atorvastatin 80 mg daily, clopidogrel 75 mg daily, Zetia 10 mg daily.

## 2023-03-03 NOTE — Assessment & Plan Note (Addendum)
Improving, not quite at goal.  Increase mirtazapine to 15 mg HS. She will update.

## 2023-03-03 NOTE — Assessment & Plan Note (Addendum)
Controlled.  Reviewed CT chest/abdomen/pelvis from February 2024. Will renew lung cancer screening program next year.  Continue Anoro Ellipta 62.5-25 mcg, 1 puff daily.  Refills provided Continue albuterol inhaler as needed for which she uses sparingly.

## 2023-03-03 NOTE — Assessment & Plan Note (Signed)
She is taking levothyroxine correctly. Continue levothyroxine 100 mcg daily.  Repeat TSH pending. 

## 2023-03-04 ENCOUNTER — Encounter: Payer: Self-pay | Admitting: Student in an Organized Health Care Education/Training Program

## 2023-03-04 ENCOUNTER — Ambulatory Visit
Payer: Medicare PPO | Attending: Student in an Organized Health Care Education/Training Program | Admitting: Student in an Organized Health Care Education/Training Program

## 2023-03-04 ENCOUNTER — Ambulatory Visit: Admission: RE | Admit: 2023-03-04 | Payer: Medicare PPO | Source: Ambulatory Visit

## 2023-03-04 ENCOUNTER — Encounter: Payer: Medicare PPO | Admitting: Pharmacist

## 2023-03-04 VITALS — BP 151/90 | HR 70 | Temp 97.4°F | Resp 18 | Ht 66.0 in | Wt 123.0 lb

## 2023-03-04 DIAGNOSIS — M5416 Radiculopathy, lumbar region: Secondary | ICD-10-CM | POA: Diagnosis not present

## 2023-03-04 DIAGNOSIS — M48062 Spinal stenosis, lumbar region with neurogenic claudication: Secondary | ICD-10-CM | POA: Diagnosis not present

## 2023-03-04 DIAGNOSIS — E1140 Type 2 diabetes mellitus with diabetic neuropathy, unspecified: Secondary | ICD-10-CM

## 2023-03-04 DIAGNOSIS — G8929 Other chronic pain: Secondary | ICD-10-CM | POA: Diagnosis not present

## 2023-03-04 DIAGNOSIS — M5412 Radiculopathy, cervical region: Secondary | ICD-10-CM | POA: Diagnosis not present

## 2023-03-04 DIAGNOSIS — M542 Cervicalgia: Secondary | ICD-10-CM

## 2023-03-04 MED ORDER — SODIUM CHLORIDE 0.9% FLUSH
2.0000 mL | Freq: Once | INTRAVENOUS | Status: AC
  Administered 2023-03-04: 10 mL

## 2023-03-04 MED ORDER — ROPIVACAINE HCL 2 MG/ML IJ SOLN
1.0000 mL | Freq: Once | INTRAMUSCULAR | Status: AC
  Administered 2023-03-04: 20 mL via EPIDURAL
  Filled 2023-03-04: qty 20

## 2023-03-04 MED ORDER — LIDOCAINE HCL 2 % IJ SOLN
20.0000 mL | Freq: Once | INTRAMUSCULAR | Status: AC
  Administered 2023-03-04: 400 mg
  Filled 2023-03-04: qty 20

## 2023-03-04 MED ORDER — DEXAMETHASONE SODIUM PHOSPHATE 10 MG/ML IJ SOLN
10.0000 mg | Freq: Once | INTRAMUSCULAR | Status: AC
  Administered 2023-03-04: 10 mg
  Filled 2023-03-04: qty 1

## 2023-03-04 MED ORDER — SODIUM CHLORIDE 0.9% FLUSH
1.0000 mL | Freq: Once | INTRAVENOUS | Status: AC
  Administered 2023-03-04: 10 mL

## 2023-03-04 MED ORDER — ROPIVACAINE HCL 2 MG/ML IJ SOLN
2.0000 mL | Freq: Once | INTRAMUSCULAR | Status: AC
  Administered 2023-03-04: 20 mL via EPIDURAL
  Filled 2023-03-04: qty 20

## 2023-03-04 MED ORDER — IOHEXOL 180 MG/ML  SOLN
10.0000 mL | Freq: Once | INTRAMUSCULAR | Status: AC
  Administered 2023-03-04: 10 mL via EPIDURAL
  Filled 2023-03-04: qty 20

## 2023-03-04 MED ORDER — CAPSAICIN-CLEANSING GEL 8 % EX KIT
4.0000 | PACK | Freq: Once | CUTANEOUS | Status: AC
  Administered 2023-03-04: 4 via TOPICAL
  Filled 2023-03-04: qty 4

## 2023-03-04 NOTE — Patient Instructions (Addendum)
Capsaicin Patches What is this medication? CAPSAICIN (cap SAY sin) relieves minor pain in your muscles and joints. It works by making your skin feel warm or cool, which blocks pain signals going to the brain. This medicine may be used for other purposes; ask your health care provider or pharmacist if you have questions. COMMON BRAND NAME(S): Qutenza What should I tell my care team before I take this medication? They need to know if you have any of these conditions: Broken or irritated skin High blood pressure History of heart attack or stroke An unusual or allergic reaction to capsaicin, hot peppers, other medications, foods, dyes, or preservatives Pregnant or trying to get pregnant Breast-feeding How should I use this medication? This medication is for external use only. It is applied by your care team in a hospital or clinic setting. Talk to your care team about the use of this medication in children. Special care may be needed. Overdosage: If you think you have taken too much of this medicine contact a poison control center or emergency room at once. NOTE: This medicine is only for you. Do not share this medicine with others. What if I miss a dose? This does not apply. What may interact with this medication? Interactions are not expected. Do not use any other skin products on the affected area without asking your care team. This list may not describe all possible interactions. Give your health care provider a list of all the medicines, herbs, non-prescription drugs, or dietary supplements you use. Also tell them if you smoke, drink alcohol, or use illegal drugs. Some items may interact with your medicine. What should I watch for while using this medication? Your condition will be monitored carefully while you are receiving this medication. Your blood pressure may go up during the procedure. Do not touch the medication patch during treatment. This medication causes red, burning skin. You  may need pain medication for during and after the procedure. This medication can make you more sensitive to heat for a few days after treatment. Be careful in hot showers or baths. Keep out of the sun. Exercise may make the treated skin feel hotter. Tell your care team if your symptoms do not start to get better or if they get worse. What side effects may I notice from receiving this medication? Side effects that you should report to your care team as soon as possible: Allergic reactions--skin rash, itching, hives, swelling of the face, lips, tongue, or throat Side effects that usually do not require medical attention (report these to your care team if they continue or are bothersome): Mild skin irritation, redness, or dryness This list may not describe all possible side effects. Call your doctor for medical advice about side effects. You may report side effects to FDA at 1-800-FDA-1088. Where should I keep my medication? This medication is given in a hospital or clinic. It will not be stored at home. NOTE: This sheet is a summary. It may not cover all possible information. If you have questions about this medicine, talk to your doctor, pharmacist, or health care provider.  2024 [employer] Standard (2021-05-07 00:00:00)  ____________________________________________________________________________________________  Post-Procedure Discharge Instructions  Instructions: Apply ice:  Purpose: This will minimize any swelling and discomfort after procedure.  When: Day of procedure, as soon as you get home. How: Fill a plastic sandwich bag with crushed ice. Cover it with a small towel and apply to injection site. How long: (15 min on, 15 min off) Apply for 15 minutes  then remove x 15 minutes.  Repeat sequence on day of procedure, until you go to bed. Apply heat:  Purpose: To treat any soreness and discomfort from the procedure. When: Starting the next day after the procedure. How: Apply heat to  procedure site starting the day following the procedure. How long: May continue to repeat daily, until discomfort goes away. Food intake: Start with clear liquids (like water) and advance to regular food, as tolerated.  Physical activities: Keep activities to a minimum for the first 8 hours after the procedure. After that, then as tolerated. Driving: If you have received any sedation, be responsible and do not drive. You are not allowed to drive for 24 hours after having sedation. Blood thinner: (Applies only to those taking blood thinners) You may restart your blood thinner 6 hours after your procedure. Insulin: (Applies only to Diabetic patients taking insulin) As soon as you can eat, you may resume your normal dosing schedule. Infection prevention: Keep procedure site clean and dry. Shower daily and clean area with soap and water. Post-procedure Pain Diary: Extremely important that this be done correctly and accurately. Recorded information will be used to determine the next step in treatment. For the purpose of accuracy, follow these rules: Evaluate only the area treated. Do not report or include pain from an untreated area. For the purpose of this evaluation, ignore all other areas of pain, except for the treated area. After your procedure, avoid taking a long nap and attempting to complete the pain diary after you wake up. Instead, set your alarm clock to go off every hour, on the hour, for the initial 8 hours after the procedure. Document the duration of the numbing medicine, and the relief you are getting from it. Do not go to sleep and attempt to complete it later. It will not be accurate. If you received sedation, it is likely that you were given a medication that may cause amnesia. Because of this, completing the diary at a later time may cause the information to be inaccurate. This information is needed to plan your care. Follow-up appointment: Keep your post-procedure follow-up evaluation  appointment after the procedure (usually 2 weeks for most procedures, 6 weeks for radiofrequencies). DO NOT FORGET to bring you pain diary with you.   Expect: (What should I expect to see with my procedure?) From numbing medicine (AKA: Local Anesthetics): Numbness or decrease in pain. You may also experience some weakness, which if present, could last for the duration of the local anesthetic. Onset: Full effect within 15 minutes of injected. Duration: It will depend on the type of local anesthetic used. On the average, 1 to 8 hours.  From steroids (Applies only if steroids were used): Decrease in swelling or inflammation. Once inflammation is improved, relief of the pain will follow. Onset of benefits: Depends on the amount of swelling present. The more swelling, the longer it will take for the benefits to be seen. In some cases, up to 10 days. Duration: Steroids will stay in the system x 2 weeks. Duration of benefits will depend on multiple posibilities including persistent irritating factors. Side-effects: If present, they may typically last 2 weeks (the duration of the steroids). Frequent: Cramps (if they occur, drink Gatorade and take over-the-counter Magnesium 450-500 mg once to twice a day); water retention with temporary weight gain; increases in blood sugar; decreased immune system response; increased appetite. Occasional: Facial flushing (red, warm cheeks); mood swings; menstrual changes. Uncommon: Long-term decrease or suppression of natural hormones;  bone thinning. (These are more common with higher doses or more frequent use. This is why we prefer that our patients avoid having any injection therapies in other practices.)  Very Rare: Severe mood changes; psychosis; aseptic necrosis. From procedure: Some discomfort is to be expected once the numbing medicine wears off. This should be minimal if ice and heat are applied as instructed.  Call if: (When should I call?) You experience numbness  and weakness that gets worse with time, as opposed to wearing off. New onset bowel or bladder incontinence. (Applies only to procedures done in the spine)  Emergency Numbers: Durning business hours (Monday - Thursday, 8:00 AM - 4:00 PM) (Friday, 9:00 AM - 12:00 Noon): [phone] After hours: [phone] NOTE: If you are having a problem and are unable connect with, or to talk to a provider, then go to your nearest urgent care or emergency department. If the problem is serious and urgent, please call 911. ____________________________________________________________________________________________   \[phone]\

## 2023-03-04 NOTE — Progress Notes (Signed)
PROVIDER NOTE: Interpretation of information contained herein should be left to medically-trained personnel. Specific patient instructions are provided elsewhere under "Patient Instructions" section of medical record. This document was created in part using STT-dictation technology, any transcriptional errors that may result from this process are unintentional.  Patient: Sarah Payne Type: Established DOB: 1945-10-12 MRN: [id number] PCP: Doreene Nest, NP  Service: Procedure DOS: 03/04/2023 Setting: Ambulatory Location: Ambulatory outpatient facility Delivery: Face-to-face Provider: Edward Jolly, MD Specialty: Interventional Pain Management Specialty designation: 09 Location: Outpatient facility Ref. Prov.: Edward Jolly, MD       Interventional Therapy   Procedure: Lumbar epidural steroid injection (LESI) (interlaminar) #1    Laterality: Midline   Level:  L2-3 Level.  Imaging: Fluoroscopic guidance         Anesthesia: Local anesthesia (1-2% Lidocaine) DOS: 03/04/2023  Performed by: Edward Jolly, MD  Purpose: Diagnostic/Therapeutic Indications: Lumbar radicular pain of intraspinal etiology of more than 4 weeks that has failed to respond to conservative therapy and is severe enough to impact quality of life or function.  NAS-11 Pain score:   Pre-procedure: 8 /10   Post-procedure: 0-No pain (cervical and lumbar)/10      Position / Prep / Materials:  Position: Prone w/ head of the table raised (slight reverse trendelenburg) to facilitate breathing.  Prep solution: DuraPrep (Iodine Povacrylex [0.7% available iodine] and Isopropyl Alcohol, 74% w/w) Prep Area: Entire Posterior Lumbar Region from lower scapular tip down to mid buttocks area and from flank to flank. Materials:  Tray: Epidural tray Needle(s):  Type: Epidural needle (Tuohy) Gauge (G):  22 Length: Regular (3.5-in) Qty: 1   H&P (Pre-op Assessment):  Sarah Payne is a 77 y.o. (year old), female patient, seen today  for interventional treatment. She  has a past surgical history that includes Back surgery; Appendectomy; Abdominal hysterectomy; Colonoscopy (N/A, 11/02/2014); Cataract extraction w/PHACO (Left, 01/30/2015); Coronary angioplasty with stent (Left, 02/06/2011); Coronary angioplasty with stent (Left, 09/17/2011); Cataract extraction w/PHACO (Right, 02/13/2015); Cardiac catheterization (N/A, 01/01/2016); Esophagogastroduodenoscopy (egd) with propofol (N/A, 09/21/2018); Shoulder surgery (Left, 2017); ABDOMINAL AORTOGRAM W/LOWER EXTREMITY (N/A, 09/12/2020); PERIPHERAL VASCULAR ATHERECTOMY (Left, 09/12/2020); PERIPHERAL VASCULAR BALLOON ANGIOPLASTY (09/12/2020); Coronary angioplasty with stent (Left, 10/02/2011); Coronary angioplasty with stent (Left, 08/19/2012); LEFT HEART CATH AND CORONARY ANGIOGRAPHY (Left, 08/02/2014); Reverse shoulder arthroplasty (Left, 10/01/2021); Eye surgery; and Esophagogastroduodenoscopy (egd) with propofol (N/A, 10/06/2022). Sarah Payne has a current medication list which includes the following prescription(s): albuterol, atorvastatin, carvedilol, clobetasol cream, freestyle libre 2 reader, freestyle libre 2 sensor, duloxetine, ezetimibe, famotidine, furosemide, gabapentin, gvoke hypopen 2-pack, novolog flexpen, insupen pen needles, isosorbide mononitrate, levothyroxine, mirtazapine, omeprazole, ondansetron, ondansetron, potassium chloride, sitagliptin, umeclidinium-vilanterol, and clopidogrel. Her primarily concern today is the Back Pain (lower), Shoulder Pain (right), and Foot Pain (Bilat neuropathy)  Initial Vital Signs:  Pulse/HCG Rate: 70ECG Heart Rate: 71 Temp: (!) 97.4 F (36.3 C) Resp: 17 BP: (!) 149/73 SpO2: 100 %  BMI: Estimated body mass index is 19.85 kg/m as calculated from the following:   Height as of this encounter: 5\' 6"  (1.676 m).   Weight as of this encounter: 123 lb (55.8 kg).  Risk Assessment: Allergies: Reviewed. She has No Known Allergies.  Allergy  Precautions: None required Coagulopathies: Reviewed. None identified.  Blood-thinner therapy: None at this time Active Infection(s): Reviewed. None identified. Sarah Payne is afebrile  Site Confirmation: Sarah Payne was asked to confirm the procedure and laterality before marking the site Procedure checklist: Completed Consent: Before the procedure and under the influence of no sedative(s), amnesic(s),  or anxiolytics, the patient was informed of the treatment options, risks and possible complications. To fulfill our ethical and legal obligations, as recommended by the American Medical Association's Code of Ethics, I have informed the patient of my clinical impression; the nature and purpose of the treatment or procedure; the risks, benefits, and possible complications of the intervention; the alternatives, including doing nothing; the risk(s) and benefit(s) of the alternative treatment(s) or procedure(s); and the risk(s) and benefit(s) of doing nothing. The patient was provided information about the general risks and possible complications associated with the procedure. These may include, but are not limited to: failure to achieve desired goals, infection, bleeding, organ or nerve damage, allergic reactions, paralysis, and death. In addition, the patient was informed of those risks and complications associated to Spine-related procedures, such as failure to decrease pain; infection (i.e.: Meningitis, epidural or intraspinal abscess); bleeding (i.e.: epidural hematoma, subarachnoid hemorrhage, or any other type of intraspinal or peri-dural bleeding); organ or nerve damage (i.e.: Any type of peripheral nerve, nerve root, or spinal cord injury) with subsequent damage to sensory, motor, and/or autonomic systems, resulting in permanent pain, numbness, and/or weakness of one or several areas of the body; allergic reactions; (i.e.: anaphylactic reaction); and/or death. Furthermore, the patient was informed of those  risks and complications associated with the medications. These include, but are not limited to: allergic reactions (i.e.: anaphylactic or anaphylactoid reaction(s)); adrenal axis suppression; blood sugar elevation that in diabetics may result in ketoacidosis or comma; water retention that in patients with history of congestive heart failure may result in shortness of breath, pulmonary edema, and decompensation with resultant heart failure; weight gain; swelling or edema; medication-induced neural toxicity; particulate matter embolism and blood vessel occlusion with resultant organ, and/or nervous system infarction; and/or aseptic necrosis of one or more joints. Finally, the patient was informed that Medicine is not an exact science; therefore, there is also the possibility of unforeseen or unpredictable risks and/or possible complications that may result in a catastrophic outcome. The patient indicated having understood very clearly. We have given the patient no guarantees and we have made no promises. Enough time was given to the patient to ask questions, all of which were answered to the patient's satisfaction. Ms. Zobrist has indicated that she wanted to continue with the procedure. Attestation: I, the ordering provider, attest that I have discussed with the patient the benefits, risks, side-effects, alternatives, likelihood of achieving goals, and potential problems during recovery for the procedure that I have provided informed consent. Date  Time: 03/04/2023  9:07 AM   Pre-Procedure Preparation:  Monitoring: As per clinic protocol. Respiration, ETCO2, SpO2, BP, heart rate and rhythm monitor placed and checked for adequate function Safety Precautions: Patient was assessed for positional comfort and pressure points before starting the procedure. Time-out: I initiated and conducted the "Time-out" before starting the procedure, as per protocol. The patient was asked to participate by confirming the accuracy  of the "Time Out" information. Verification of the correct person, site, and procedure were performed and confirmed by me, the nursing staff, and the patient. "Time-out" conducted as per Joint Commission's Universal Protocol (UP.01.01.01). Time: 1000 Start Time: 1005 hrs.  Description/Narrative of Procedure:          Target: Epidural space via interlaminar opening, initially targeting the lower laminar border of the superior vertebral body. Region: Lumbar Approach: Percutaneous paravertebral  Rationale (medical necessity): procedure needed and proper for the diagnosis and/or treatment of the patient's medical symptoms and needs. Procedural Technique Safety  Precautions: Aspiration looking for blood return was conducted prior to all injections. At no point did we inject any substances, as a needle was being advanced. No attempts were made at seeking any paresthesias. Safe injection practices and needle disposal techniques used. Medications properly checked for expiration dates. SDV (single dose vial) medications used. Description of the Procedure: Protocol guidelines were followed. The procedure needle was introduced through the skin, ipsilateral to the reported pain, and advanced to the target area. Bone was contacted and the needle walked caudad, until the lamina was cleared. The epidural space was identified using "loss-of-resistance technique" with 2-3 ml of PF-NaCl (0.9% NSS), in a 5cc LOR glass syringe.  5 cc solution made of 2 cc of preservative-free saline, 2 cc of 0.2% ropivacaine, 1 cc of Decadron 10 mg/cc.   Vitals:   03/04/23 0914 03/04/23 0943 03/04/23 0948  BP: (!) 149/73 (!) 153/85 (!) 151/90  Pulse: 70    Resp: 17 12 18   Temp: (!) 97.4 F (36.3 C)    SpO2: 100% 100% 100%  Weight: 123 lb (55.8 kg)    Height: 5\' 6"  (1.676 m)      Start Time: 1005 hrs. End Time: 1058 hrs.  Imaging Guidance (Spinal):          Type of Imaging Technique: Fluoroscopy Guidance  (Spinal) Indication(s): Assistance in needle guidance and placement for procedures requiring needle placement in or near specific anatomical locations not easily accessible without such assistance. Exposure Time: Please see nurses notes. Contrast: Before injecting any contrast, we confirmed that the patient did not have an allergy to iodine, shellfish, or radiological contrast. Once satisfactory needle placement was completed at the desired level, radiological contrast was injected. Contrast injected under live fluoroscopy. No contrast complications. See chart for type and volume of contrast used. Fluoroscopic Guidance: I was personally present during the use of fluoroscopy. "Tunnel Vision Technique" used to obtain the best possible view of the target area. Parallax error corrected before commencing the procedure. "Direction-depth-direction" technique used to introduce the needle under continuous pulsed fluoroscopy. Once target was reached, antero-posterior, oblique, and lateral fluoroscopic projection used confirm needle placement in all planes. Images permanently stored in EMR. Interpretation: I personally interpreted the imaging intraoperatively. Adequate needle placement confirmed in multiple planes. Appropriate spread of contrast into desired area was observed. No evidence of afferent or efferent intravascular uptake. No intrathecal or subarachnoid spread observed. Permanent images saved into the patient's record.  Antibiotic Prophylaxis:   Anti-infectives (From admission, onward)    None      Indication(s): None identified  Post-operative Assessment:  Post-procedure Vital Signs:  Pulse/HCG Rate: 7073 Temp: (!) 97.4 F (36.3 C) Resp: 18 BP: (!) 151/90 SpO2: 100 %  EBL: None  Complications: No immediate post-treatment complications observed by team, or reported by patient.  Note: The patient tolerated the entire procedure well. A repeat set of vitals were taken after the procedure  and the patient was kept under observation following institutional policy, for this type of procedure. Post-procedural neurological assessment was performed, showing return to baseline, prior to discharge. The patient was provided with post-procedure discharge instructions, including a section on how to identify potential problems. Should any problems arise concerning this procedure, the patient was given instructions to immediately contact us, at any time, without hesitation. In any case, we plan to contact the patient by telephone for a follow-up status report regarding this interventional procedure.  Comments:  No additional relevant information.  Plan of Care (POC)  Orders:  Orders Placed This Encounter  Procedures   DG PAIN CLINIC C-ARM 1-60 MIN NO REPORT    Intraoperative interpretation by procedural physician at [facility].    Standing Status:   Standing    Number of Occurrences:   1    Order Specific Question:   Reason for exam:    Answer:   Assistance in needle guidance and placement for procedures requiring needle placement in or near specific anatomical locations not easily accessible without such assistance.     Medications ordered for procedure: Meds ordered this encounter  Medications   iohexol (OMNIPAQUE) 180 MG/ML injection 10 mL    Must be Myelogram-compatible. If not available, you may substitute with a water-soluble, non-ionic, hypoallergenic, myelogram-compatible radiological contrast medium.   lidocaine (XYLOCAINE) 2 % (with pres) injection 400 mg   dexamethasone (DECADRON) injection 10 mg   dexamethasone (DECADRON) injection 10 mg   ropivacaine (PF) 2 mg/mL (0.2%) (NAROPIN) injection 1 mL   sodium chloride flush (NS) 0.9 % injection 1 mL   ropivacaine (PF) 2 mg/mL (0.2%) (NAROPIN) injection 2 mL   sodium chloride flush (NS) 0.9 % injection 2 mL   capsaicin topical system 8 % patch 4 patch   Medications administered: We administered iohexol, lidocaine,  dexamethasone, dexamethasone, ropivacaine (PF) 2 mg/mL (0.2%), sodium chloride flush, ropivacaine (PF) 2 mg/mL (0.2%), sodium chloride flush, and capsaicin topical system.  See the medical record for exact dosing, route, and time of administration.  Follow-up plan:   No follow-ups on file.       Left C-ESI, L-ESI, [place] 03/04/23    Recent Visits Date Type Provider Dept  02/17/23 Office Visit Edward Jolly, MD [facility]  01/27/23 Office Visit Edward Jolly, MD [facility]  Showing recent visits within past 90 days and meeting all other requirements Today's Visits Date Type Provider Dept  03/04/23 Procedure visit Edward Jolly, MD [facility]  Showing today's visits and meeting all other requirements Future Appointments Date Type Provider Dept  04/01/23 Appointment Edward Jolly, MD [facility]  Showing future appointments within next 90 days and meeting all other requirements  Disposition: Discharge home  Discharge (Date  Time): 03/04/2023; 1101 hrs.   Primary Care Physician: Doreene Nest, NP Location: [facility] Note by: Edward Jolly, MD (TTS technology used. I apologize for any typographical errors that were not detected and corrected.) Date: 03/04/2023; Time: 12:09 PM  Disclaimer:  Medicine is not an Visual merchandiser. The only guarantee in medicine is that nothing is guaranteed. It is important to note that the decision to proceed with this intervention was based on the information collected from the patient. The Data and conclusions were drawn from the patient's questionnaire, the interview, and the physical examination. Because the information was provided in large part by the patient, it cannot be guaranteed that it has not been purposely or unconsciously manipulated. Every effort has been made to obtain as much relevant data as possible for this evaluation. It is important to note that the conclusions  that lead to this procedure are derived in large part from the available data. Always take into account that the treatment will also be dependent on availability of resources and existing treatment guidelines, considered by other Pain Management Practitioners as being common knowledge and practice, at the time of the intervention. For Medico-Legal purposes, it is also important to point out that variation in procedural techniques and pharmacological choices are the acceptable norm. The indications,  contraindications, technique, and results of the above procedure should only be interpreted and judged by a Board-Certified Interventional Pain Specialist with extensive familiarity and expertise in the same exact procedure and technique.

## 2023-03-04 NOTE — Progress Notes (Signed)
PROVIDER NOTE: Interpretation of information contained herein should be left to medically-trained personnel. Specific patient instructions are provided elsewhere under "Patient Instructions" section of medical record. This document was created in part using STT-dictation technology, any transcriptional errors that may result from this process are unintentional.  Patient: Sarah Payne Type: Established DOB: 10-01-1945 MRN: [id number] PCP: Doreene Nest, NP  Service: Procedure DOS: 03/04/2023 Setting: Ambulatory Location: Ambulatory outpatient facility Delivery: Face-to-face Provider: Edward Jolly, MD Specialty: Interventional Pain Management Specialty designation: 09 Location: Outpatient facility Ref. Prov.: Edward Jolly, MD       Interventional Therapy   Interventional Treatment:           Type: Qutenza Neurolysis #1  Laterality:  Bilateral Area treated: Feet Imaging Guidance: None Anesthesia/analgesia/anxiolysis/sedation: None required Medication (Right): Qutenza (capsaicin 8%) topical system Medication (Left): Qutenza (capsaicin 8%) topical system Date: 03/04/2023 Performed by: Edward Jolly, MD Rationale (medical necessity): procedure needed and proper for the treatment of [name]'s medical symptoms and needs. Indication: Painful diabetic peripheral neuralgia (DPN) (ICD-10-CM:E11.40) severe enough to impact quality of life or function.     Position / Prep / Materials:  Position: Supine  Materials: Qutenza Kit  H&P (Pre-op Assessment):  [name] is a 77 y.o. (year old), female patient, seen today for interventional treatment. She  has a past surgical history that includes Back surgery; Appendectomy; Abdominal hysterectomy; Colonoscopy (N/A, 11/02/2014); Cataract extraction w/PHACO (Left, 01/30/2015); Coronary angioplasty with stent (Left, 02/06/2011); Coronary angioplasty with stent (Left, 09/17/2011); Cataract extraction w/PHACO (Right, 02/13/2015); Cardiac  catheterization (N/A, 01/01/2016); Esophagogastroduodenoscopy (egd) with propofol (N/A, 09/21/2018); Shoulder surgery (Left, 2017); ABDOMINAL AORTOGRAM W/LOWER EXTREMITY (N/A, 09/12/2020); PERIPHERAL VASCULAR ATHERECTOMY (Left, 09/12/2020); PERIPHERAL VASCULAR BALLOON ANGIOPLASTY (09/12/2020); Coronary angioplasty with stent (Left, 10/02/2011); Coronary angioplasty with stent (Left, 08/19/2012); LEFT HEART CATH AND CORONARY ANGIOGRAPHY (Left, 08/02/2014); Reverse shoulder arthroplasty (Left, 10/01/2021); Eye surgery; and Esophagogastroduodenoscopy (egd) with propofol (N/A, 10/06/2022). [name] has a current medication list which includes the following prescription(s): albuterol, atorvastatin, carvedilol, clobetasol cream, freestyle libre 2 reader, freestyle libre 2 sensor, duloxetine, ezetimibe, famotidine, furosemide, gabapentin, gvoke hypopen 2-pack, novolog flexpen, insupen pen needles, isosorbide mononitrate, levothyroxine, mirtazapine, omeprazole, ondansetron, ondansetron, potassium chloride, sitagliptin, umeclidinium-vilanterol, and clopidogrel. Her primarily concern today is the Back Pain (lower), Shoulder Pain (right), and Foot Pain (Bilat neuropathy)  Initial Vital Signs:  Pulse/HCG Rate: 70ECG Heart Rate: 71 Temp: (!) 97.4 F (36.3 C) Resp: 17 BP: (!) 149/73 SpO2: 100 %  BMI: Estimated body mass index is 19.85 kg/m as calculated from the following:   Height as of this encounter: 5\' 6"  (1.676 m).   Weight as of this encounter: 123 lb (55.8 kg).  Risk Assessment: Allergies: Reviewed. She has No Known Allergies.  Allergy Precautions: None required Coagulopathies: Reviewed. None identified.  Blood-thinner therapy: None at this time Active Infection(s): Reviewed. None identified. [name] is afebrile  Site Confirmation: [name] was asked to confirm the procedure and laterality before marking the site Procedure checklist: Completed Consent: Before the procedure and under the influence  of no sedative(s), amnesic(s), or anxiolytics, the patient was informed of the treatment options, risks and possible complications. To fulfill our ethical and legal obligations, as recommended by the American Medical Association's Code of Ethics, I have informed the patient of my clinical impression; the nature and purpose of the treatment or procedure; the risks, benefits, and possible complications of the intervention; the alternatives, including doing nothing; the risk(s) and benefit(s) of the alternative treatment(s) or procedure(s); and the risk(s)  and benefit(s) of doing nothing. The patient was provided information about the general risks and possible complications associated with the procedure. These may include, but are not limited to: failure to achieve desired goals, infection, bleeding, organ or nerve damage, allergic reactions, paralysis, and death. In addition, the patient was informed of those risks and complications associated to the procedure, such as failure to decrease pain; infection; bleeding; organ or nerve damage with subsequent damage to sensory, motor, and/or autonomic systems, resulting in permanent pain, numbness, and/or weakness of one or several areas of the body; allergic reactions; (i.e.: anaphylactic reaction); and/or death. Furthermore, the patient was informed of those risks and complications associated with the medications. These include, but are not limited to: allergic reactions (i.e.: anaphylactic or anaphylactoid reaction(s)); adrenal axis suppression; blood sugar elevation that in diabetics may result in ketoacidosis or comma; water retention that in patients with history of congestive heart failure may result in shortness of breath, pulmonary edema, and decompensation with resultant heart failure; weight gain; swelling or edema; medication-induced neural toxicity; particulate matter embolism and blood vessel occlusion with resultant organ, and/or nervous system infarction;  and/or aseptic necrosis of one or more joints. Finally, the patient was informed that Medicine is not an exact science; therefore, there is also the possibility of unforeseen or unpredictable risks and/or possible complications that may result in a catastrophic outcome. The patient indicated having understood very clearly. We have given the patient no guarantees and we have made no promises. Enough time was given to the patient to ask questions, all of which were answered to the patient's satisfaction. Ms. Labell has indicated that she wanted to continue with the procedure. Attestation: I, the ordering provider, attest that I have discussed with the patient the benefits, risks, side-effects, alternatives, likelihood of achieving goals, and potential problems during recovery for the procedure that I have provided informed consent. Date  Time: 03/04/2023  9:07 AM  Pre-Procedure Preparation:  Monitoring: As per clinic protocol. Respiration, ETCO2, SpO2, BP, heart rate and rhythm monitor placed and checked for adequate function Safety Precautions: Patient was assessed for positional comfort and pressure points before starting the procedure. Time-out: I initiated and conducted the "Time-out" before starting the procedure, as per protocol. The patient was asked to participate by confirming the accuracy of the "Time Out" information. Verification of the correct person, site, and procedure were performed and confirmed by me, the nursing staff, and the patient. "Time-out" conducted as per Joint Commission's Universal Protocol (UP.01.01.01). Time: 1000 Start Time: 1005 hrs.  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  Description of  the Procedure: Protocol guidelines were followed. The patient was assisted into a comfortable position.  Informed consent was obtained in the patient monitored in the usual manner.  All questions were answered prior to the procedure.  They Qutenza patches were applied to the affected area and then covered with the wrap.  The Patient was kept under observation until the treatment was completed.  The patches were removed and the treated area was inspected.  Vitals:   03/04/23 0914 03/04/23 0943 03/04/23 0948  BP: (!) 149/73 (!) 153/85 (!) 151/90  Pulse: 70    Resp: 17 12 18   Temp: (!) 97.4 F (36.3 C)  SpO2: 100% 100% 100%  Weight: 123 lb (55.8 kg)    Height: 5\' 6"  (1.676 m)       End Time: 1058 hrs.  Type of Imaging Technique: None used Indication(s): N/A Exposure Time: No patient exposure Contrast: None used. Fluoroscopic Guidance: N/A Ultrasound Guidance: N/A Interpretation: N/A  Post-operative Assessment:  Post-procedure Vital Signs:  Pulse/HCG Rate: 7073 Temp: (!) 97.4 F (36.3 C) Resp: 18 BP: (!) 151/90 SpO2: 100 %  EBL: None  Complications: No immediate post-treatment complications observed by team, or reported by patient.  Note: The patient tolerated the entire procedure well. A repeat set of vitals were taken after the procedure and the patient was kept under observation following institutional policy, for this type of procedure. Post-procedural neurological assessment was performed, showing return to baseline, prior to discharge. The patient was provided with post-procedure discharge instructions, including a section on how to identify potential problems. Should any problems arise concerning this procedure, the patient was given instructions to immediately contact us, at any time, without hesitation. In any case, we plan to contact the patient by telephone for a follow-up status report regarding this interventional procedure.  Comments:  No additional relevant  information.  Plan of Care (POC)  Orders:  Orders Placed This Encounter  Procedures   DG PAIN CLINIC C-ARM 1-60 MIN NO REPORT    Intraoperative interpretation by procedural physician at [facility].    Standing Status:   Standing    Number of Occurrences:   1    Order Specific Question:   Reason for exam:    Answer:   Assistance in needle guidance and placement for procedures requiring needle placement in or near specific anatomical locations not easily accessible without such assistance.    Medications ordered for procedure: Meds ordered this encounter  Medications   iohexol (OMNIPAQUE) 180 MG/ML injection 10 mL    Must be Myelogram-compatible. If not available, you may substitute with a water-soluble, non-ionic, hypoallergenic, myelogram-compatible radiological contrast medium.   lidocaine (XYLOCAINE) 2 % (with pres) injection 400 mg   dexamethasone (DECADRON) injection 10 mg   dexamethasone (DECADRON) injection 10 mg   ropivacaine (PF) 2 mg/mL (0.2%) (NAROPIN) injection 1 mL   sodium chloride flush (NS) 0.9 % injection 1 mL   ropivacaine (PF) 2 mg/mL (0.2%) (NAROPIN) injection 2 mL   sodium chloride flush (NS) 0.9 % injection 2 mL   capsaicin topical system 8 % patch 4 patch   Medications administered: We administered iohexol, lidocaine, dexamethasone, dexamethasone, ropivacaine (PF) 2 mg/mL (0.2%), sodium chloride flush, ropivacaine (PF) 2 mg/mL (0.2%), sodium chloride flush, and capsaicin topical system.  See the medical record for exact dosing, route, and time of administration.  Follow-up plan:   Return in about 4 weeks (around 04/01/2023) for Post Procedure Evaluation, in person.       Recent Visits Date Type Provider Dept  02/17/23 Office Visit Edward Jolly, MD [facility]  01/27/23 Office Visit Edward Jolly, MD [facility]  Showing recent visits within past 90 days and meeting all other requirements Today's Visits Date Type Provider  Dept  03/04/23 Procedure visit Edward Jolly, MD [facility]  Showing today's visits and meeting all other requirements Future Appointments Date Type Provider Dept  04/01/23 Appointment Edward Jolly, MD [facility]  Showing future appointments within next 90 days and meeting all other requirements  Disposition: Discharge home  Discharge (Date  Time): 03/04/2023; 1101 hrs.   Primary Care Physician: Vernona Rieger  K, NP Location: [facility] Note by: Edward Jolly, MD (TTS technology used. I apologize for any typographical errors that were not detected and corrected.) Date: 03/04/2023; Time: 12:12 PM  Disclaimer:  Medicine is not an Visual merchandiser. The only guarantee in medicine is that nothing is guaranteed. It is important to note that the decision to proceed with this intervention was based on the information collected from the patient. The Data and conclusions were drawn from the patient's questionnaire, the interview, and the physical examination. Because the information was provided in large part by the patient, it cannot be guaranteed that it has not been purposely or unconsciously manipulated. Every effort has been made to obtain as much relevant data as possible for this evaluation. It is important to note that the conclusions that lead to this procedure are derived in large part from the available data. Always take into account that the treatment will also be dependent on availability of resources and existing treatment guidelines, considered by other Pain Management Practitioners as being common knowledge and practice, at the time of the intervention. For Medico-Legal purposes, it is also important to point out that variation in procedural techniques and pharmacological choices are the acceptable norm. The indications, contraindications, technique, and results of the above procedure should only be interpreted and judged by a Board-Certified  Interventional Pain Specialist with extensive familiarity and expertise in the same exact procedure and technique.

## 2023-03-04 NOTE — Progress Notes (Signed)
PROVIDER NOTE: Interpretation of information contained herein should be left to medically-trained personnel. Specific patient instructions are provided elsewhere under "Patient Instructions" section of medical record. This document was created in part using STT-dictation technology, any transcriptional errors that may result from this process are unintentional.  Patient: Sarah Payne Type: Established DOB: 08-Dec-1945 MRN: [id number] PCP: Doreene Nest, NP  Service: Procedure DOS: 03/04/2023 Setting: Ambulatory Location: Ambulatory outpatient facility Delivery: Face-to-face Provider: Edward Jolly, MD Specialty: Interventional Pain Management Specialty designation: 09 Location: Outpatient facility Ref. Prov.: Edward Jolly, MD       Interventional Therapy   Procedure: Cervical Epidural Steroid injection (CESI) (Interlaminar) #1  Laterality: Left  Level: C7-T1 Imaging: Fluoroscopy-assisted DOS: 03/04/2023  Performed by: Edward Jolly, MD Anesthesia: Local anesthesia (1-2% Lidocaine)   Purpose: Diagnostic/Therapeutic Indications: Cervicalgia, cervical radicular pain, degenerative disc disease, severe enough to impact quality of life or function.  NAS-11 score:   Pre-procedure: 8 /10   Post-procedure: 0-No pain (cervical and lumbar)/10      Position  Prep  Materials:  Location setting: Procedure suite Position: Prone, on modified reverse trendelenburg to facilitate breathing, with head in head-cradle. Pillows positioned under chest (below chin-level) with cervical spine flexed. Safety Precautions: Patient was assessed for positional comfort and pressure points before starting the procedure. Prepping solution: DuraPrep (Iodine Povacrylex [0.7% available iodine] and Isopropyl Alcohol, 74% w/w) Prep Area: Entire  cervicothoracic region Approach: percutaneous, paramedial Intended target: Posterior cervical epidural space Materials Procedure:  Tray: Epidural Needle(s): Epidural  (Tuohy) Qty: 1 Length: (90mm) 3.5-inch Gauge: 22G   H&P (Pre-op Assessment):  Sarah Payne is a 77 y.o. (year old), female patient, seen today for interventional treatment. She  has a past surgical history that includes Back surgery; Appendectomy; Abdominal hysterectomy; Colonoscopy (N/A, 11/02/2014); Cataract extraction w/PHACO (Left, 01/30/2015); Coronary angioplasty with stent (Left, 02/06/2011); Coronary angioplasty with stent (Left, 09/17/2011); Cataract extraction w/PHACO (Right, 02/13/2015); Cardiac catheterization (N/A, 01/01/2016); Esophagogastroduodenoscopy (egd) with propofol (N/A, 09/21/2018); Shoulder surgery (Left, 2017); ABDOMINAL AORTOGRAM W/LOWER EXTREMITY (N/A, 09/12/2020); PERIPHERAL VASCULAR ATHERECTOMY (Left, 09/12/2020); PERIPHERAL VASCULAR BALLOON ANGIOPLASTY (09/12/2020); Coronary angioplasty with stent (Left, 10/02/2011); Coronary angioplasty with stent (Left, 08/19/2012); LEFT HEART CATH AND CORONARY ANGIOGRAPHY (Left, 08/02/2014); Reverse shoulder arthroplasty (Left, 10/01/2021); Eye surgery; and Esophagogastroduodenoscopy (egd) with propofol (N/A, 10/06/2022). Sarah Payne has a current medication list which includes the following prescription(s): albuterol, atorvastatin, carvedilol, clobetasol cream, freestyle libre 2 reader, freestyle libre 2 sensor, duloxetine, ezetimibe, famotidine, furosemide, gabapentin, gvoke hypopen 2-pack, novolog flexpen, insupen pen needles, isosorbide mononitrate, levothyroxine, mirtazapine, omeprazole, ondansetron, ondansetron, potassium chloride, sitagliptin, umeclidinium-vilanterol, and clopidogrel. Her primarily concern today is the Back Pain (lower), Shoulder Pain (right), and Foot Pain (Bilat neuropathy)  Initial Vital Signs:  Pulse/HCG Rate: 70ECG Heart Rate: 71 Temp: (!) 97.4 F (36.3 C) Resp: 17 BP: (!) 149/73 SpO2: 100 %  BMI: Estimated body mass index is 19.85 kg/m as calculated from the following:   Height as of this encounter: 5\' 6"   (1.676 m).   Weight as of this encounter: 123 lb (55.8 kg).  Risk Assessment: Allergies: Reviewed. She has No Known Allergies.  Allergy Precautions: None required Coagulopathies: Reviewed. None identified.  Blood-thinner therapy: None at this time Active Infection(s): Reviewed. None identified. Sarah Payne is afebrile  Site Confirmation: Sarah Payne was asked to confirm the procedure and laterality before marking the site Procedure checklist: Completed Consent: Before the procedure and under the influence of no sedative(s), amnesic(s), or anxiolytics, the patient was informed of the treatment options, risks and  possible complications. To fulfill our ethical and legal obligations, as recommended by the American Medical Association's Code of Ethics, I have informed the patient of my clinical impression; the nature and purpose of the treatment or procedure; the risks, benefits, and possible complications of the intervention; the alternatives, including doing nothing; the risk(s) and benefit(s) of the alternative treatment(s) or procedure(s); and the risk(s) and benefit(s) of doing nothing. The patient was provided information about the general risks and possible complications associated with the procedure. These may include, but are not limited to: failure to achieve desired goals, infection, bleeding, organ or nerve damage, allergic reactions, paralysis, and death. In addition, the patient was informed of those risks and complications associated to Spine-related procedures, such as failure to decrease pain; infection (i.e.: Meningitis, epidural or intraspinal abscess); bleeding (i.e.: epidural hematoma, subarachnoid hemorrhage, or any other type of intraspinal or peri-dural bleeding); organ or nerve damage (i.e.: Any type of peripheral nerve, nerve root, or spinal cord injury) with subsequent damage to sensory, motor, and/or autonomic systems, resulting in permanent pain, numbness, and/or weakness of one or  several areas of the body; allergic reactions; (i.e.: anaphylactic reaction); and/or death. Furthermore, the patient was informed of those risks and complications associated with the medications. These include, but are not limited to: allergic reactions (i.e.: anaphylactic or anaphylactoid reaction(s)); adrenal axis suppression; blood sugar elevation that in diabetics may result in ketoacidosis or comma; water retention that in patients with history of congestive heart failure may result in shortness of breath, pulmonary edema, and decompensation with resultant heart failure; weight gain; swelling or edema; medication-induced neural toxicity; particulate matter embolism and blood vessel occlusion with resultant organ, and/or nervous system infarction; and/or aseptic necrosis of one or more joints. Finally, the patient was informed that Medicine is not an exact science; therefore, there is also the possibility of unforeseen or unpredictable risks and/or possible complications that may result in a catastrophic outcome. The patient indicated having understood very clearly. We have given the patient no guarantees and we have made no promises. Enough time was given to the patient to ask questions, all of which were answered to the patient's satisfaction. Ms. Beckel has indicated that she wanted to continue with the procedure. Attestation: I, the ordering provider, attest that I have discussed with the patient the benefits, risks, side-effects, alternatives, likelihood of achieving goals, and potential problems during recovery for the procedure that I have provided informed consent. Date  Time: 03/04/2023  9:07 AM   Pre-Procedure Preparation:  Monitoring: As per clinic protocol. Respiration, ETCO2, SpO2, BP, heart rate and rhythm monitor placed and checked for adequate function Safety Precautions: Patient was assessed for positional comfort and pressure points before starting the procedure. Time-out: I initiated  and conducted the "Time-out" before starting the procedure, as per protocol. The patient was asked to participate by confirming the accuracy of the "Time Out" information. Verification of the correct person, site, and procedure were performed and confirmed by me, the nursing staff, and the patient. "Time-out" conducted as per Joint Commission's Universal Protocol (UP.01.01.01). Time: 1000 Start Time: 1005 hrs.  Description  Narrative of Procedure:          Rationale (medical necessity): procedure needed and proper for the diagnosis and/or treatment of the patient's medical symptoms and needs. Start Time: 1005 hrs. Safety Precautions: Aspiration looking for blood return was conducted prior to all injections. At no point did we inject any substances, as a needle was being advanced. No attempts were made at  seeking any paresthesias. Safe injection practices and needle disposal techniques used. Medications properly checked for expiration dates. SDV (single dose vial) medications used. Description of procedure: Protocol guidelines were followed. The patient was assisted into a comfortable position. The target area was identified and the area prepped in the usual manner. Skin & deeper tissues infiltrated with local anesthetic. Appropriate amount of time allowed to pass for local anesthetics to take effect. Using fluoroscopic guidance, the epidural needle was introduced through the skin, ipsilateral to the reported pain, and advanced to the target area. Posterior laminar os was contacted and the needle walked caudad, until the lamina was cleared. The ligamentum flavum was engaged and the epidural space identified using "loss-of-resistance technique" with 2-3 ml of PF-NaCl (0.9% NSS), in a 5cc dedicated LOR syringe. (See "Imaging guidance" below for use of contrast details.) Once proper needle placement was secured, and negative aspiration confirmed, the solution was injected in intermittent fashion, asking for  systemic symptoms every 0.5cc. The needles were then removed and the area cleansed, making sure to leave some of the prepping solution back to take advantage of its long term bactericidal properties.  3 cc solution made of 1 cc of preservative-free saline, 1 cc of 0.2% ropivacaine, 1 cc of Decadron 10 mg/cc.   Vitals:   03/04/23 0914 03/04/23 0943 03/04/23 0948  BP: (!) 149/73 (!) 153/85 (!) 151/90  Pulse: 70    Resp: 17 12 18   Temp: (!) 97.4 F (36.3 C)    SpO2: 100% 100% 100%  Weight: 123 lb (55.8 kg)    Height: 5\' 6"  (1.676 m)       End Time: 1058 hrs.  Imaging Guidance (Spinal):          Type of Imaging Technique: Fluoroscopy Guidance (Spinal) Indication(s): Assistance in needle guidance and placement for procedures requiring needle placement in or near specific anatomical locations not easily accessible without such assistance. Exposure Time: Please see nurses notes. Contrast: Before injecting any contrast, we confirmed that the patient did not have an allergy to iodine, shellfish, or radiological contrast. Once satisfactory needle placement was completed at the desired level, radiological contrast was injected. Contrast injected under live fluoroscopy. No contrast complications. See chart for type and volume of contrast used. Fluoroscopic Guidance: I was personally present during the use of fluoroscopy. "Tunnel Vision Technique" used to obtain the best possible view of the target area. Parallax error corrected before commencing the procedure. "Direction-depth-direction" technique used to introduce the needle under continuous pulsed fluoroscopy. Once target was reached, antero-posterior, oblique, and lateral fluoroscopic projection used confirm needle placement in all planes. Images permanently stored in EMR. Interpretation: I personally interpreted the imaging intraoperatively. Adequate needle placement confirmed in multiple planes. Appropriate spread of contrast into desired area was  observed. No evidence of afferent or efferent intravascular uptake. No intrathecal or subarachnoid spread observed. Permanent images saved into the patient's record.  Post-operative Assessment:  Post-procedure Vital Signs:  Pulse/HCG Rate: 7073 Temp: (!) 97.4 F (36.3 C) Resp: 18 BP: (!) 151/90 SpO2: 100 %  EBL: None  Complications: No immediate post-treatment complications observed by team, or reported by patient.  Note: The patient tolerated the entire procedure well. A repeat set of vitals were taken after the procedure and the patient was kept under observation following institutional policy, for this type of procedure. Post-procedural neurological assessment was performed, showing return to baseline, prior to discharge. The patient was provided with post-procedure discharge instructions, including a section on how to identify potential  problems. Should any problems arise concerning this procedure, the patient was given instructions to immediately contact us, at any time, without hesitation. In any case, we plan to contact the patient by telephone for a follow-up status report regarding this interventional procedure.  Comments:  No additional relevant information.  Plan of Care (POC)  Orders:  Orders Placed This Encounter  Procedures   DG PAIN CLINIC C-ARM 1-60 MIN NO REPORT    Intraoperative interpretation by procedural physician at [facility].    Standing Status:   Standing    Number of Occurrences:   1    Order Specific Question:   Reason for exam:    Answer:   Assistance in needle guidance and placement for procedures requiring needle placement in or near specific anatomical locations not easily accessible without such assistance.    Medications ordered for procedure: Meds ordered this encounter  Medications   iohexol (OMNIPAQUE) 180 MG/ML injection 10 mL    Must be Myelogram-compatible. If not available, you may substitute with a water-soluble, non-ionic,  hypoallergenic, myelogram-compatible radiological contrast medium.   lidocaine (XYLOCAINE) 2 % (with pres) injection 400 mg   dexamethasone (DECADRON) injection 10 mg   dexamethasone (DECADRON) injection 10 mg   ropivacaine (PF) 2 mg/mL (0.2%) (NAROPIN) injection 1 mL   sodium chloride flush (NS) 0.9 % injection 1 mL   ropivacaine (PF) 2 mg/mL (0.2%) (NAROPIN) injection 2 mL   sodium chloride flush (NS) 0.9 % injection 2 mL   capsaicin topical system 8 % patch 4 patch   Medications administered: We administered iohexol, lidocaine, dexamethasone, dexamethasone, ropivacaine (PF) 2 mg/mL (0.2%), sodium chloride flush, ropivacaine (PF) 2 mg/mL (0.2%), sodium chloride flush, and capsaicin topical system.  See the medical record for exact dosing, route, and time of administration.  Follow-up plan:   Return in about 4 weeks (around 04/01/2023) for Post Procedure Evaluation, in person.       Recent Visits Date Type Provider Dept  02/17/23 Office Visit Edward Jolly, MD [facility]  01/27/23 Office Visit Edward Jolly, MD [facility]  Showing recent visits within past 90 days and meeting all other requirements Today's Visits Date Type Provider Dept  03/04/23 Procedure visit Edward Jolly, MD [facility]  Showing today's visits and meeting all other requirements Future Appointments Date Type Provider Dept  04/01/23 Appointment Edward Jolly, MD [facility]  Showing future appointments within next 90 days and meeting all other requirements  Disposition: Discharge home  Discharge (Date  Time): 03/04/2023; 1101 hrs.   Primary Care Physician: Doreene Nest, NP Location: [facility] [place] [facility] Note by: Edward Jolly, MD (TTS technology used. I apologize for any typographical errors that were not detected and corrected.) Date: 03/04/2023; Time: 12:11 PM  Disclaimer:  Medicine is not an Visual merchandiser. The only guarantee in medicine  is that nothing is guaranteed. It is important to note that the decision to proceed with this intervention was based on the information collected from the patient. The Data and conclusions were drawn from the patient's questionnaire, the interview, and the physical examination. Because the information was provided in large part by the patient, it cannot be guaranteed that it has not been purposely or unconsciously manipulated. Every effort has been made to obtain as much relevant data as possible for this evaluation. It is important to note that the conclusions that lead to this procedure are derived in large part from the available data. Always take into account  that the treatment will also be dependent on availability of resources and existing treatment guidelines, considered by other Pain Management Practitioners as being common knowledge and practice, at the time of the intervention. For Medico-Legal purposes, it is also important to point out that variation in procedural techniques and pharmacological choices are the acceptable norm. The indications, contraindications, technique, and results of the above procedure should only be interpreted and judged by a Board-Certified Interventional Pain Specialist with extensive familiarity and expertise in the same exact procedure and technique.

## 2023-03-05 ENCOUNTER — Telehealth: Payer: Self-pay | Admitting: *Deleted

## 2023-03-05 NOTE — Telephone Encounter (Signed)
Called for post procedure check. No answer. LVM. 

## 2023-03-06 ENCOUNTER — Ambulatory Visit: Payer: Self-pay

## 2023-03-06 NOTE — Patient Outreach (Signed)
  Care Coordination   Follow Up Visit Note   03/06/2023 Name: Sarah Payne MRN: [id number] DOB: 03-30-1946  Sarah Payne is a 77 y.o. year old female who sees Doreene Nest, NP for primary care. I spoke with  Sarah Payne by phone today.  What matters to the patients health and wellness today?  Patient reports having follow up visit with pain management provider on 02/17/23. She reports receiving steroid injections in shoulder and back.  Patient reports her pain level is 0 at this time.   Patient reports having recent follow up with primary care provider.  She states she has experienced low blood sugars at least 1 time per day. Patient states her primary care provider is aware and adjusted her insulin.  Patient states she is also not taking Lantas anymore.  Patient reports her appetite is not good and reports ongoing weight loss.  Patient states her primary care provider increased her mirtazapine to help with sleep and appetite.  Patient denies any increase symptoms related to HF.    Goals Addressed             This Visit's Progress    Management and education regarding health conditions       Interventions Today    Flowsheet Row Most Recent Value  Chronic Disease   Chronic disease during today's visit Diabetes, Congestive Heart Failure (CHF), Other  [chronic neck/ shoulder/ back pain]  General Interventions   General Interventions Discussed/Reviewed General Interventions Reviewed, Doctor Visits  [evaluation of current treatment plan for DM, HF, chronic pain and patients adherence to plan as established by provider.  Assessed for pain level.]  Doctor Visits Discussed/Reviewed Doctor Visits Reviewed  [name] upcoming provider visits.]  Education Interventions   Education Provided Provided Education  [reviewed heart failure symptoms. Advised to contact provider for mild symptoms and call 911 for severe.]  Provided Verbal Education On Blood Sugar Monitoring  [Assessed blood sugar  readings.  Reviewed hypoglycemic management.  Encouraged to eat smaller meals more frequently.]  Nutrition Interventions   Nutrition Discussed/Reviewed Nutrition Reviewed  [Advised to eat smaller meals more frequently. Discussed relation of not eating much or infrequently and hypoglycemia when taking insulin.]  Pharmacy Interventions   Pharmacy Dicussed/Reviewed Pharmacy Topics Reviewed  [medications reviewed and compliance discussed.  Discussed recent medication changes with insulin and mirtarzipine.  Advised to continue to take pain medication as prescribed.]              SDOH assessments and interventions completed:  No     Care Coordination Interventions:  Yes, provided   Follow up plan: Follow up call scheduled for 04/08/23    Encounter Outcome:  Pt. Visit Completed   [name] RN,BSN,CCM [facility] Care Coordination [phone] direct line

## 2023-03-06 NOTE — Patient Instructions (Signed)
Visit Information  Thank you for taking time to visit with me today. Please don't hesitate to contact me if I can be of assistance to you.   Following are the goals we discussed today:   Goals Addressed             This Visit's Progress    Management and education regarding health conditions       Interventions Today    Flowsheet Row Most Recent Value  Chronic Disease   Chronic disease during today's visit Diabetes, Congestive Heart Failure (CHF), Other  [chronic neck/ shoulder/ back pain]  General Interventions   General Interventions Discussed/Reviewed General Interventions Reviewed, Doctor Visits  [evaluation of current treatment plan for DM, HF, chronic pain and patients adherence to plan as established by provider.  Assessed for pain level.]  Doctor Visits Discussed/Reviewed Doctor Visits Reviewed  [name] upcoming provider visits.]  Education Interventions   Education Provided Provided Education  [reviewed heart failure symptoms. Advised to contact provider for mild symptoms and call 911 for severe.]  Provided Verbal Education On Blood Sugar Monitoring  [Assessed blood sugar readings.  Reviewed hypoglycemic management.  Encouraged to eat smaller meals more frequently.]  Nutrition Interventions   Nutrition Discussed/Reviewed Nutrition Reviewed  [Advised to eat smaller meals more frequently. Discussed relation of not eating much or infrequently and hypoglycemia when taking insulin.]  Pharmacy Interventions   Pharmacy Dicussed/Reviewed Pharmacy Topics Reviewed  [medications reviewed and compliance discussed.  Discussed recent medication changes with insulin and mirtarzipine.  Advised to continue to take pain medication as prescribed.]              Our next appointment is by telephone on 04/08/23 at 11 am  Please call the care guide team at [phone] if you need to cancel or reschedule your appointment.   If you are experiencing a Mental Health or Behavioral Health  Crisis or need someone to talk to, please call the Suicide and Crisis Lifeline: 988 call 1-800-273-TALK (toll free, 24 hour hotline)  Patient verbalizes understanding of instructions and care plan provided today and agrees to view in MyChart. Active MyChart status and patient understanding of how to access instructions and care plan via MyChart confirmed with patient.     [name] RN,BSN,CCM [facility] Care Coordination [phone] direct line

## 2023-03-09 ENCOUNTER — Ambulatory Visit: Payer: Medicare PPO | Admitting: Gastroenterology

## 2023-03-13 ENCOUNTER — Other Ambulatory Visit: Payer: Self-pay | Admitting: Primary Care

## 2023-03-13 DIAGNOSIS — G8929 Other chronic pain: Secondary | ICD-10-CM

## 2023-03-16 ENCOUNTER — Telehealth: Payer: Self-pay | Admitting: Primary Care

## 2023-03-16 DIAGNOSIS — E1151 Type 2 diabetes mellitus with diabetic peripheral angiopathy without gangrene: Secondary | ICD-10-CM

## 2023-03-16 MED ORDER — FREESTYLE LIBRE 2 SENSOR MISC
1 refills | Status: DC
Start: 2023-03-16 — End: 2023-07-23

## 2023-03-16 NOTE — Telephone Encounter (Signed)
Refills sent to pharmacy. 

## 2023-03-16 NOTE — Telephone Encounter (Signed)
Prescription Request  03/16/2023  LOV: 03/03/2023  What is the name of the medication or equipment?  Continuous Glucose Sensor (FREESTYLE LIBRE 2 SENSOR) MISC    Have you contacted your pharmacy to request a refill? No   Which pharmacy would you like this sent to?  [facility] [address], [place] - [address] [place] [place] [address] Phone: [phone] Fax: [phone]    Patient notified that their request is being sent to the clinical staff for review and that they should receive a response within 2 business days.   Please advise at [facility] [phone]

## 2023-04-01 ENCOUNTER — Ambulatory Visit: Payer: Medicare PPO | Admitting: Student in an Organized Health Care Education/Training Program

## 2023-04-06 ENCOUNTER — Other Ambulatory Visit: Payer: Medicare PPO | Admitting: Pharmacist

## 2023-04-08 ENCOUNTER — Ambulatory Visit: Payer: Self-pay

## 2023-04-08 ENCOUNTER — Telehealth: Payer: Self-pay

## 2023-04-08 ENCOUNTER — Other Ambulatory Visit: Payer: Medicare PPO | Admitting: Pharmacist

## 2023-04-08 NOTE — Patient Outreach (Signed)
Care Coordination   04/08/2023 Name: HIDEKO HORNSTEIN MRN: [id number] DOB: [birth year]   Care Coordination Outreach Attempts:  An unsuccessful telephone outreach was attempted for a scheduled appointment today. HIPAA compliant message left with return call number.   Follow Up Plan:  Additional outreach attempts will be made to offer the patient care coordination information and services.   Encounter Outcome:  No Answer   Care Coordination Interventions:  No, not indicated    [name] [facility] Care Coordination [phone] direct line

## 2023-04-08 NOTE — Patient Instructions (Signed)
Visit Information  Thank you for taking time to visit with me today. Please don't hesitate to contact me if I can be of assistance to you.   Following are the goals we discussed today:   Goals Addressed             This Visit's Progress    Management and education regarding health conditions       Interventions Today    Flowsheet Row Most Recent Value  Chronic Disease   Chronic disease during today's visit Diabetes, Congestive Heart Failure (CHF), Other  [chronic neck, shoulder and back pain]  General Interventions   General Interventions Discussed/Reviewed General Interventions Reviewed, Doctor Visits  [evaluation of current treatment plan for mentioned health conditions and patients adherence to plan as established by provider. Assessed for HF symptoms, pain level and blood sugars]  Doctor Visits Discussed/Reviewed Doctor Visits Reviewed  [name] upcoming provider visits. Advised to keep follow up appointments with provider.  Patient provided name and appointment date and time for upcoming gastroenterology appointment on 04/23/23]  Education Interventions   Education Provided Provided Education  [patient advised to weigh daily and record. Heart failure action plan discussed.]  Provided Verbal Education On When to see the doctor  Nutrition Interventions   Nutrition Discussed/Reviewed Nutrition Reviewed, Increasing proteins, Adding fruits and vegetables  Pharmacy Interventions   Pharmacy Dicussed/Reviewed Pharmacy Topics Reviewed  [medications reviewed.  Compliance of medications discussed.   Message sent to practice pharmacist  updating her on patients ongoing daily hypoglycemic events.]              Our next appointment is by telephone on 05/18/23 at 11 am  Please call the care guide team at [phone] if you need to cancel or reschedule your appointment.   If you are experiencing a Mental Health or Behavioral Health Crisis or need someone to talk to, please call the  Suicide and Crisis Lifeline: 988 call 1-800-273-TALK (toll free, 24 hour hotline)  Patient verbalizes understanding of instructions and care plan provided today and agrees to view in MyChart. Active MyChart status and patient understanding of how to access instructions and care plan via MyChart confirmed with patient.     [name] RN,BSN,CCM [facility] Care Coordination [phone] direct line

## 2023-04-08 NOTE — Progress Notes (Unsigned)
04/08/2023 Name: Sarah Payne MRN: [id number] DOB: [birth year]  No chief complaint on file.   Sarah Payne is a 77 y.o. year old female who presented for a telephone visit.   They were referred to the pharmacist by their PCP for assistance in managing diabetes. PMH also significant for CAD, CHF, COPD, OSA, hypothyroidism, CKD, hyperlipidemia, diabetic neuropathy, tobacco dependence.  Last Diabetes-Related Visit: 03/03/23 with PCP, 02/09/23 with Pharmacist Changes at Last Visit: Increase mirtazapine to 15 mg daily (03/03/23)  Care Team: Primary Care Provider: Doreene Nest, NP ; Next Scheduled Visit: 06/03/23 Cardiologist: Julien Nordmann, MD  Subjective: Feels that sleep has greatly improved since starting mirtazapine. No longer waking at 3 am, is able to sleep in easily until 7-8 am.  Also notes that mood has slightly improved as well with less depressive feelings. Appetite has remained the same. Denies adverse effects.   Medication Access/Adherence  Patient reports affordability concerns with their medications: No  Patient reports access/transportation concerns to their pharmacy: No  Patient reports adherence concerns with their medications:  No     Diabetes:  Current medications:  Novolog 8 units twice daily (lunch and supper time) - Reports using 5-6 units Januvia 100 mg daily   Medications previously tried: glipizide, metformin IR (diarrhea), Trulicity (cost)   SMBG: CGM (Libre 2) Average glucose: 7-day average (179 mg/dL); 40-JWJ (191 mg/dL); 47-WGN (562 mg/dL) Fasting usually around 130-140 mg/dL per patient report  Hypoglycemia: Patient reports hypoglycemic s/sx including dizziness, shakiness, sweating. Rarely over night. Most commonly occurring in the afternoon.   Diet:  Breakfast: skips. Coffee with 3 Splenda with sugar-free creamer - Usually uses 5 mg insulin after coffee if BG >200 mg/dL Lunch (13-08:65 pm): sandwich (tomatoes), sometimes chips - Checks BG  (if <170 mg/dL will wait until after eating to give insulin -- wait for BG to go up) Dinner (6:30 pm): meat and 2 vegetables (low appetite, often will make dinner and find she is full after several bites) Snacks: May have small handful of chips  Current Reported Novolog Use: 10 am: Skips breakfast, only coffee with splena/sugar-free creamer. Then, mid-morning gives 5 units without eating because she notices her sugar increases ~200 mg/dL from her morning coffee. Noon: Gives 6-8 units of insulin. Sometimes skips lunch or eats lunch later in the afternoon but generally has sandwich. Waits to give insulin until after lunch once sugar reaches ~200 mg/dL.  Dinner: Usually eats only small portion of dinner and reports drops in sugar in the evening   Hyperlipidemia/ASCVD Risk Reduction (secondary prevention)  Current lipid lowering medications: atorvastatin 80 mg daily, ezetimibe 10 mg daily  Antiplatelet regimen: clopidogrel 75 mg daily (not taking, reports this was stopped for a procedure/surgery and she never restarted it). Reports it is not on her medication list.   ASCVD History: Yes, clinical ASCVD (managed by cardiology)   Heart Failure:  Current medications:  ACEi/ARB/ARNI: none, hypotension/orthostasis at baseline SGLT2i: none; orthostasis Beta blocker: carvedilol 3.125 mg twice daily. Limited by bradycardia Mineralocorticoid Receptor Antagonist: none Diuretic regimen: furosemide 20 mg PRN - has not needed recently    Depression/Insomnia:  Current medications: Mirtazapine 15 mg nightly (increased   Objective:  Lab Results  Component Value Date   HGBA1C 8.1 (A) 03/03/2023    Lab Results  Component Value Date   CREATININE 1.20 03/03/2023   BUN 20 03/03/2023   NA 137 03/03/2023   K 4.0 03/03/2023   CL 101 03/03/2023   CO2 29 03/03/2023  Lab Results  Component Value Date   CHOL 171 03/03/2023   HDL 46.50 03/03/2023   LDLCALC 93 03/03/2023   LDLDIRECT 52  08/28/2022   TRIG 158.0 (H) 03/03/2023   CHOLHDL 4 03/03/2023    Medications Reviewed Today   Medications were not reviewed in this encounter     Assessment/Plan:   Diabetes: uncontrolled with primary concern today of ongoing hypoglycemia. Last A1c 8.1% (03/03/23) with a reasonable goal < 7.5-8% given age, comorbidities, and risk of hypoglycemia at this time. CGM average glucose does show some improvement over the past month: 7-day average (179 mg/dL), 16-XWR (604 mg/dL), 54-UJW (119 mg/dL) suggesting J4N likely at/close to goal. Hypoglycemia is very likely secondary to Novolog administration after or without meals. We spent majority of the visit today reviewing insulin administration timing/onset of action, and how using insulin after meals/when not eating carbs can cause hypoglycemia later on. Continue Januvia 100 mg daily  Novolog: Compromised to decrease Novolog use with morning coffee (using 5 units). Reduce to max 2-3 units. Stop using AM insulin if any lows before lunch (skips breakfast) Stop using insulin mid-morning to 'correct' sugars, do not use insulin if skipping lunch Use 6-8 units of insulin with lunch containing carbs, avoid using insulin long after eating Use 0-4 units insulin with dinner (lean meat/veggie, no carbs, rarely eats more than 3 bites) Patient verbalized understanding of instructions today, and wrote them down in a notebook. We agreed to phone check in in 1 week to tweak insulin as needed, especially around evening meal. Future consideration: Avoiding GLP1-RA given concern for low appetite/wt loss issues, hx acute pancreatitis SGLT2i deferred by cardiology d/t hx orthostasis Low dose metformin XR may be reasonable if renal function remains stable on future labs with eGFR consistently >30 mL/min   Hyperlipidemia/Clinical ASCVD:uncontrolled with last LDL 93 mg/dL on 03/19/55, goal <21 mg/dL. Currently managed by cardiology. Biggest focus at this time is glycemic  control given hypoglycemia, defer further lipid management at this time. Tolerating statin/ezetimibe well. Notably, patient has not been taking any antiplatelet therapy since her procedure ~4 weeks ago. Pre-op procedural notes indicate intent to resume after procedure.  Current Regimen: atorvastatin 80 mg daily, ezetimibe 10 mg daily Not taking clopidogrel (as above) Continue current regimen, messaged pain md regarding antiplatelet resumption clarification   Heart Failure: managed by cardiology. CGMT optimization limited by HR as well as prior orthostasis concerns. No changes warranted at this time.  Current Regimen: carvedilol 3.125 mg BID, furosemide 20 mg (+ potassium chloride 10 mEq), Imdur 30 mg daily Recommend to continue current regimen    Depression/Sleep: Improved with recent increase of mirtazapine to 15 mg daily. Reports sleeping through the night/sleeping in until ~8 am (improved from consistently waking ~3 am). Also reports reduction in depressive mood. No change in appetite. Continue current medications.  Continue Mirtazapine 15 mg daily, duloxetine 60 mg daily    Follow Up Plan: phone call in 1 weeks for insulin adjustment as needed PCP follow up scheduled ~2 months on 06/03/23  Loree Fee, PharmD Clinical Pharmacist [facility] [phone]

## 2023-04-08 NOTE — Patient Outreach (Signed)
Care Coordination   Follow Up Visit Note   04/08/2023 Name: NATYLEE WALLOCK MRN: [id number] DOB: 04/22/1946  DONIESHA RHINES is a 77 y.o. year old female who sees Doreene Nest, NP for primary care. I spoke with  [name] by phone today.  What matters to the patients health and wellness today?  Patient reports cortisone injection to left shoulder has helped with pain level. Patient reports ongoing low back pain.  Patient denies any increase in swelling in LE.  She reports ongoing SOB with activity.  Patient states her weight today is 120 lbs stable within the past couple of weeks.  Patient states she continues to have at least 1-2 low blood sugar readings per day after she eats.  Patient states today she drank her coffee with unsweetened creamer and 3 splenda packs She reports checking her blood sugar which was 120 and taking 6 units of insulin at 10:00 am.  She reports around lunch time having a sandwich and fries then 30 minutes later checking blood sugar which was 115.  Approximately 45 min later blood sugar was 102.  Patient states this occasionally occurs after eating dinner as well.  She reports lowest blood sugar reading being 45.    Goals Addressed             This Visit's Progress    Management and education regarding health conditions       Interventions Today    Flowsheet Row Most Recent Value  Chronic Disease   Chronic disease during today's visit Diabetes, Congestive Heart Failure (CHF), Other  [chronic neck, shoulder and back pain]  General Interventions   General Interventions Discussed/Reviewed General Interventions Reviewed, Doctor Visits  [evaluation of current treatment plan for mentioned health conditions and patients adherence to plan as established by provider. Assessed for HF symptoms, pain level and blood sugars]  Doctor Visits Discussed/Reviewed Doctor Visits Reviewed  [name] upcoming provider visits. Advised to keep follow up appointments with provider.   Patient provided name and appointment date and time for upcoming gastroenterology appointment on 04/23/23]  Education Interventions   Education Provided Provided Education  [patient advised to weigh daily and record. Heart failure action plan discussed.]  Provided Verbal Education On When to see the doctor  Nutrition Interventions   Nutrition Discussed/Reviewed Nutrition Reviewed, Increasing proteins, Adding fruits and vegetables  Pharmacy Interventions   Pharmacy Dicussed/Reviewed Pharmacy Topics Reviewed  [medications reviewed.  Compliance of medications discussed.   Message sent to practice pharmacist  updating her on patients ongoing daily hypoglycemic events.]              SDOH assessments and interventions completed:  No     Care Coordination Interventions:  Yes, provided   Follow up plan: Follow up call scheduled for 05/18/23    Encounter Outcome:  Patient Visit Completed   [name] RN,BSN,CCM [facility] Care Coordination [phone] direct line

## 2023-04-09 ENCOUNTER — Ambulatory Visit
Payer: Medicare PPO | Attending: Student in an Organized Health Care Education/Training Program | Admitting: Student in an Organized Health Care Education/Training Program

## 2023-04-09 ENCOUNTER — Encounter: Payer: Self-pay | Admitting: Student in an Organized Health Care Education/Training Program

## 2023-04-09 VITALS — BP 174/94 | HR 87 | Temp 97.4°F | Resp 17 | Ht 66.5 in | Wt 120.0 lb

## 2023-04-09 DIAGNOSIS — M961 Postlaminectomy syndrome, not elsewhere classified: Secondary | ICD-10-CM

## 2023-04-09 DIAGNOSIS — E1140 Type 2 diabetes mellitus with diabetic neuropathy, unspecified: Secondary | ICD-10-CM

## 2023-04-09 DIAGNOSIS — G894 Chronic pain syndrome: Secondary | ICD-10-CM | POA: Diagnosis not present

## 2023-04-09 DIAGNOSIS — G8929 Other chronic pain: Secondary | ICD-10-CM | POA: Diagnosis not present

## 2023-04-09 DIAGNOSIS — M5412 Radiculopathy, cervical region: Secondary | ICD-10-CM

## 2023-04-09 DIAGNOSIS — M5416 Radiculopathy, lumbar region: Secondary | ICD-10-CM | POA: Diagnosis not present

## 2023-04-09 DIAGNOSIS — Z794 Long term (current) use of insulin: Secondary | ICD-10-CM | POA: Diagnosis not present

## 2023-04-09 NOTE — Progress Notes (Signed)
Safety precautions to be maintained throughout the outpatient stay will include: orient to surroundings, keep bed in low position, maintain call bell within reach at all times, provide assistance with transfer out of bed and ambulation.  

## 2023-04-09 NOTE — Progress Notes (Signed)
PROVIDER NOTE: Information contained herein reflects review and annotations entered in association with encounter. Interpretation of such information and data should be left to medically-trained personnel. Information provided to patient can be located elsewhere in the medical record under "Patient Instructions". Document created using STT-dictation technology, any transcriptional errors that may result from process are unintentional.    Patient: Sarah Payne  Service Category: E/M  Provider: Edward Jolly, MD  DOB: Apr 13, 1946  DOS: 04/09/2023  Referring Provider: Doreene Nest, NP  MRN: [id number]  Specialty: Interventional Pain Management  PCP: Doreene Nest, NP  Type: Established Patient  Setting: Ambulatory outpatient    Location: Office  Delivery: Face-to-face     HPI  [name], a 77 y.o. year old female, is here today because of her Cervical radicular pain [M54.12]. Sarah Payne primary complain today is Neck Pain (RADIATING TO RIGHT SHOULDER)  Pertinent problems: Sarah Payne has CAD S/P percutaneous coronary angioplasty; History of lumbar fusion; Uncontrolled type 2 diabetes mellitus with hypoglycemia, with long-term current use of insulin (HCC); Bronchitis, chronic obstructive (HCC); Obstructive sleep apnea; COPD (chronic obstructive pulmonary disease) (HCC); Lumbar degenerative disc disease; Lumbar spondylosis with myelopathy; Cannabis use disorder, mild, abuse; Peripheral polyneuropathy; Tobacco dependence; Chronic pain syndrome; Chronic neck pain; Chronic pain of right upper extremity; Cervical spondylosis; and Chronic musculoskeletal pain on their pertinent problem list. Pain Assessment: Severity of Chronic pain is reported as a 5 /10. Location: Neck Right, Left/RIGHT SHOULDER. Onset: More than a month ago. Quality: Aching, Constant. Timing: Constant. Modifying factor(s): GABAPENTIN. Vitals:  height is 5' 6.5" (1.689 m) and weight is 120 lb (54.4 kg). Her temporal temperature is  97.4 F (36.3 C) (abnormal). Her blood pressure is 174/94 (abnormal) and her pulse is 87. Her respiration is 17 and oxygen saturation is 98%.  BMI: Estimated body mass index is 19.08 kg/m as calculated from the following:   Height as of this encounter: 5' 6.5" (1.689 m).   Weight as of this encounter: 120 lb (54.4 kg). Last encounter: 02/17/2023. Last procedure: 03/04/2023.  Reason for encounter: post-procedure evaluation and assessment.    Patient presents today for postprocedural evaluation after cervical ESI, lumbar ESI and Qutenza treatment.  She had a left C7-T1 ESI on 03/04/2023 that she is obtaining 70% pain relief from.  She also had a midline L2-L3 ESI which she is obtaining 70% pain relief from.  She had a Qutenza treatment for painful diabetic neuropathic pain from which she is receiving 100% pain relief from.  She is very pleased with the results.  For some reason, she forgot to restart her Plavix the day after her procedure.  Reminded her to go in and do that.  ROS  Constitutional: Denies any fever or chills Gastrointestinal: No reported hemesis, hematochezia, vomiting, or acute GI distress Musculoskeletal: Denies any acute onset joint swelling, redness, loss of ROM, or weakness Neurological: No reported episodes of acute onset apraxia, aphasia, dysarthria, agnosia, amnesia, paralysis, loss of coordination, or loss of consciousness  Medication Review  DULoxetine, FreeStyle Libre 2 Reader, FreeStyle Libre 2 Sensor, Glucagon, Insulin Pen Needle, albuterol, atorvastatin, carvedilol, clobetasol cream, clopidogrel, ezetimibe, famotidine, furosemide, gabapentin, insulin aspart, isosorbide mononitrate, levothyroxine, mirtazapine, omeprazole, ondansetron, potassium chloride, sitaGLIPtin, and umeclidinium-vilanterol  History Review  Allergy: Sarah Payne has No Known Allergies. Drug: Sarah Payne  reports current drug use. Drug: Marijuana. Alcohol:  reports no history of alcohol use. Tobacco:   reports that she has been smoking cigarettes. She has a 45 pack-year smoking  history. She has never used smokeless tobacco. Social: Sarah Payne  reports that she has been smoking cigarettes. She has a 45 pack-year smoking history. She has never used smokeless tobacco. She reports current drug use. Drug: Marijuana. She reports that she does not drink alcohol. Medical:  has a past medical history of Acute pain of left shoulder (01/17/2019), Acute renal failure superimposed on stage 3b chronic kidney disease (HCC) (09/03/2022), Aortic atherosclerosis (HCC), Bilateral cataracts (01/2015), CHF (congestive heart failure) (HCC) (09/16/2013), Chronic low back pain, Clostridioides difficile infection (2015), COPD (chronic obstructive pulmonary disease) (HCC), Coronary artery disease, Depression, Diverticulosis, Frequent PVCs, GERD (gastroesophageal reflux disease), History of heart artery stent, Hyperlipidemia, Hypertension, Hypothyroidism, Impingement syndrome of left shoulder, LBBB (left bundle branch block), Left flank pain (10/28/2022), Long term current use of antithrombotics/antiplatelets, Marijuana abuse, Myocardial infarction (HCC), OSA (obstructive sleep apnea), Pancreatitis (09/19/2022), Paroxysmal SVT (supraventricular tachycardia) (05/08/2021), T2DM (type 2 diabetes mellitus) (HCC), Takotsubo cardiomyopathy, Tendonitis of left rotator cuff, Tobacco abuse, Vaginal burning (03/22/2019), and Vascular dementia. Surgical: Sarah Payne  has a past surgical history that includes Back surgery; Appendectomy; Abdominal hysterectomy; Colonoscopy (N/A, 11/02/2014); Cataract extraction w/PHACO (Left, 01/30/2015); Coronary angioplasty with stent (Left, 02/06/2011); Coronary angioplasty with stent (Left, 09/17/2011); Cataract extraction w/PHACO (Right, 02/13/2015); Cardiac catheterization (N/A, 01/01/2016); Esophagogastroduodenoscopy (egd) with propofol (N/A, 09/21/2018); Shoulder surgery (Left, 2017); ABDOMINAL AORTOGRAM W/LOWER  EXTREMITY (N/A, 09/12/2020); PERIPHERAL VASCULAR ATHERECTOMY (Left, 09/12/2020); PERIPHERAL VASCULAR BALLOON ANGIOPLASTY (09/12/2020); Coronary angioplasty with stent (Left, 10/02/2011); Coronary angioplasty with stent (Left, 08/19/2012); LEFT HEART CATH AND CORONARY ANGIOGRAPHY (Left, 08/02/2014); Reverse shoulder arthroplasty (Left, 10/01/2021); Eye surgery; and Esophagogastroduodenoscopy (egd) with propofol (N/A, 10/06/2022). Family: family history includes Colon cancer in her sister; Heart attack in her mother; Heart disease in her father and mother; Liver cancer in her brother; Throat cancer in her brother.  Laboratory Chemistry Profile   Renal Lab Results  Component Value Date   BUN 20 03/03/2023   CREATININE 1.20 03/03/2023   BCR 14 09/24/2022   GFR 44.44 (L) 03/03/2023   GFRAA 55 (L) 08/30/2020   GFRNONAA 38 (L) 09/11/2022    Hepatic Lab Results  Component Value Date   AST 14 10/28/2022   ALT 9 10/28/2022   ALBUMIN 4.2 10/28/2022   ALKPHOS 86 10/28/2022   HCVAB NON REACTIVE 09/04/2022   AMYLASE 32 04/11/2016   LIPASE 28.0 10/28/2022    Electrolytes Lab Results  Component Value Date   NA 137 03/03/2023   K 4.0 03/03/2023   CL 101 03/03/2023   CALCIUM 9.6 03/03/2023   MG 1.6 (L) 10/03/2021    Bone Lab Results  Component Value Date   VD25OH 41 02/25/2013    Inflammation (CRP: Acute Phase) (ESR: Chronic Phase) Lab Results  Component Value Date   CRP 2.1 04/11/2013   ESRSEDRATE 58 (H) 04/11/2013         Note: Above Lab results reviewed.  Recent Imaging Review  DG PAIN CLINIC C-ARM 1-60 MIN NO REPORT Fluoro was used, but no Radiologist interpretation will be provided.  Please refer to "NOTES" tab for provider progress note. Note: Reviewed        Physical Exam  General appearance: Well nourished, well developed, and well hydrated. In no apparent acute distress Mental status: Alert, oriented x 3 (person, place, & time)       Respiratory: No evidence of acute  respiratory distress Eyes: PERLA Vitals: BP (!) 174/94 Comment: RIGHT WAS 167/847  Pulse 87   Temp (!) 97.4 F (36.3 C) (Temporal)  Resp 17   Ht 5' 6.5" (1.689 m)   Wt 120 lb (54.4 kg)   SpO2 98%   BMI 19.08 kg/m  BMI: Estimated body mass index is 19.08 kg/m as calculated from the following:   Height as of this encounter: 5' 6.5" (1.689 m).   Weight as of this encounter: 120 lb (54.4 kg). Ideal: Ideal body weight: 60.5 kg (133 lb 4.3 oz)  Improvement in neck pain and radiating left arm pain  Improvement in low back pain, improved range of motion  Improvement in paresthesias of both feet  Assessment   Diagnosis Status  1. Cervical radicular pain   2. Chronic radicular lumbar pain   3. Chronic painful diabetic neuropathy (HCC)   4. Failed back surgical syndrome   5. Chronic pain syndrome    Controlled Controlled Controlled     Plan of Care  1. Cervical radicular pain - Cervical Epidural Injection; Standing  2. Chronic radicular lumbar pain - Lumbar Epidural Injection; Standing  3. Chronic painful diabetic neuropathy (HCC) - NEUROLYSIS; Future  4. Failed back surgical syndrome - Lumbar Epidural Injection; Standing  5. Chronic pain syndrome - Cervical Epidural Injection; Standing - Lumbar Epidural Injection; Standing - NEUROLYSIS; Future    Orders:  Orders Placed This Encounter  Procedures   Cervical Epidural Injection    Standing Status:   Standing    Number of Occurrences:   3    Standing Expiration Date:   04/08/2024    Scheduling Instructions:     Procedure: Cervical Epidural Steroid Injection/Block     Level(s): C7-T1     PRN    Order Specific Question:   Where will this procedure be performed?    Answer:   [facility]    Comments:   Amye Grego   Lumbar Epidural Injection    Standing Status:   Standing    Number of Occurrences:   3    Standing Expiration Date:   04/08/2024    Scheduling Instructions:     Procedure: Interlaminar Lumbar  Epidural Steroid injection (LESI)            PRN    Order Specific Question:   Where will this procedure be performed?    Answer:   [facility]   NEUROLYSIS    Please order Qutenza patches from pharmacy    Standing Status:   Future    Standing Expiration Date:   07/09/2023    Order Specific Question:   Where will this procedure be performed?    Answer:   [facility]   Follow-up plan:   Return in about 2 months (around 06/08/2023) for Qutenza #2.      Left C-ESI, L-ESI, Qutenza 03/04/23     Recent Visits Date Type Provider Dept  03/04/23 Procedure visit Edward Jolly, MD [facility]  02/17/23 Office Visit Edward Jolly, MD [facility]  01/27/23 Office Visit Edward Jolly, MD [facility]  Showing recent visits within past 90 days and meeting all other requirements Today's Visits Date Type Provider Dept  04/09/23 Office Visit Edward Jolly, MD [facility]  Showing today's visits and meeting all other requirements Future Appointments No visits were found meeting these conditions. Showing future appointments within next 90 days and meeting all other requirements  I discussed the assessment and treatment plan with the patient. The patient was provided an opportunity to ask questions and all were answered. The patient agreed with the plan and demonstrated an  understanding of the instructions.  Patient advised to call back or seek an in-person evaluation if the symptoms or condition worsens.  Duration of encounter: .  Total time on encounter, as per AMA guidelines included both the face-to-face and non-face-to-face time personally spent by the physician and/or other qualified health care professional(s) on the day of the encounter (includes time in activities that require the physician or other qualified health care professional and does not include time in activities normally performed by clinical staff). Physician's  time may include the following activities when performed: Preparing to see the patient (e.g., pre-charting review of records, searching for previously ordered imaging, lab work, and nerve conduction tests) Review of prior analgesic pharmacotherapies. Reviewing PMP Interpreting ordered tests (e.g., lab work, imaging, nerve conduction tests) Performing post-procedure evaluations, including interpretation of diagnostic procedures Obtaining and/or reviewing separately obtained history Performing a medically appropriate examination and/or evaluation Counseling and educating the patient/family/caregiver Ordering medications, tests, or procedures Referring and communicating with other health care professionals (when not separately reported) Documenting clinical information in the electronic or other health record Independently interpreting results (not separately reported) and communicating results to the patient/ family/caregiver Care coordination (not separately reported)  Note by: Edward Jolly, MD Date: 04/09/2023; Time: 2:18 PM

## 2023-04-09 NOTE — Progress Notes (Addendum)
1331 PATIENT GOT 100% RELIEF WITH THE QUTENZA FOR ABOUT 5 DAYS THEN THE BURNING, TINGLING AND PAIN RETURNED TO BASELINE GRADUALLY.  1351 pre procedure instructions given to patient.

## 2023-04-09 NOTE — Patient Instructions (Addendum)
PRN C-ESI, and L-ESI- call to schedule if/when pain returns F/U 3 months for Qutenza #2   Epidural Steroid Injection Patient Information  Description: The epidural space surrounds the nerves as they exit the spinal cord.  In some patients, the nerves can be compressed and inflamed by a bulging disc or a tight spinal canal (spinal stenosis).  By injecting steroids into the epidural space, we can bring irritated nerves into direct contact with a potentially helpful medication.  These steroids act directly on the irritated nerves and can reduce swelling and inflammation which often leads to decreased pain.  Epidural steroids may be injected anywhere along the spine and from the neck to the low back depending upon the location of your pain.   After numbing the skin with local anesthetic (like Novocaine), a small needle is passed into the epidural space slowly.  You may experience a sensation of pressure while this is being done.  The entire block usually last less than 10 minutes.  Conditions which may be treated by epidural steroids:  Low back and leg pain Neck and arm pain Spinal stenosis Post-laminectomy syndrome Herpes zoster (shingles) pain Pain from compression fractures  Preparation for the injection:  Do not eat any solid food or dairy products within 8 hours of your appointment.  You may drink clear liquids up to 3 hours before appointment.  Clear liquids include water, black coffee, juice or soda.  No milk or cream please. You may take your regular medication, including pain medications, with a sip of water before your appointment  Diabetics should hold regular insulin (if taken separately) and take 1/2 normal NPH dos the morning of the procedure.  Carry some sugar containing items with you to your appointment. A driver must accompany you and be prepared to drive you home after your procedure.  Bring all your current medications with your. An IV may be inserted and sedation may be  given at the discretion of the physician.   A blood pressure cuff, EKG and other monitors will often be applied during the procedure.  Some patients may need to have extra oxygen administered for a short period. You will be asked to provide medical information, including your allergies, prior to the procedure.  We must know immediately if you are taking blood thinners (like Coumadin/Warfarin)  Or if you are allergic to IV iodine contrast (dye). We must know if you could possible be pregnant.  Possible side-effects: Bleeding from needle site Infection (rare, may require surgery) Nerve injury (rare) Numbness & tingling (temporary) Difficulty urinating (rare, temporary) Spinal headache ( a headache worse with upright posture) Light -headedness (temporary) Pain at injection site (several days) Decreased blood pressure (temporary) Weakness in arm/leg (temporary) Pressure sensation in back/neck (temporary)  Call if you experience: Fever/chills associated with headache or increased back/neck pain. Headache worsened by an upright position. New onset weakness or numbness of an extremity below the injection site Hives or difficulty breathing (go to the emergency room) Inflammation or drainage at the infection site Severe back/neck pain Any new symptoms which are concerning to you  Please note:  Although the local anesthetic injected can often make your back or neck feel good for several hours after the injection, the pain will likely return.  It takes 3-7 days for steroids to work in the epidural space.  You may not notice any pain relief for at least that one week.  If effective, we will often do a series of three injections spaced 3-6 weeks  apart to maximally decrease your pain.  After the initial series, we generally will wait several months before considering a repeat injection of the same type.  If you have any questions, please call [phone] [facility]

## 2023-04-10 NOTE — Telephone Encounter (Signed)
Called patient in regard to instructions on her clopidogrel. Per surgeon, she was intended to resume clopidogrel after her procedure as was documented in pre-op notes.  Patient verbalized understanding of confirms that she restarted her clopidogrel today.   Loree Fee, PharmD Clinical Pharmacist [facility] [phone]

## 2023-04-16 ENCOUNTER — Other Ambulatory Visit: Payer: Medicare PPO

## 2023-04-16 NOTE — Progress Notes (Deleted)
04/16/2023 Name: Sarah Payne MRN: [id number] DOB: [birth year]  No chief complaint on file.   Sarah Payne is a 77 y.o. year old female who presented for a telephone visit.   They were referred to the pharmacist by their PCP for assistance in managing diabetes. PMH also significant for CAD, CHF, COPD, OSA, hypothyroidism, CKD, hyperlipidemia, diabetic neuropathy, tobacco dependence.  Last Diabetes-Related Visit: 04/08/23 with Pharmacist' 03/03/23 with PCP  Changes at Last Visit: Increase mirtazapine to 15 mg daily (03/03/23) Resume clopidogrel Compromised to decrease Novolog use with morning coffee (using 5 units). Reduce to max 2-3 units. Stop using AM insulin if any lows before lunch (skips breakfast) Stop using insulin mid-morning to 'correct' sugars, do not use insulin if skipping lunch Use 6-8 units of insulin with lunch containing carbs, avoid using insulin long after eating Use 0-4 units insulin with dinner (lean meat/veggie, no carbs, rarely eats more than 3 bites)  Care Team: Primary Care Provider: Doreene Nest, NP ; Next Scheduled Visit: 06/03/23 Cardiologist: Julien Nordmann, MD  Subjective: Since last visit, patient confirms implementing plan as discussed ***  Feels that sleep has greatly improved since starting mirtazapine. No longer waking at 3 am, is able to sleep in easily until 7-8 am.  Also notes that mood has slightly improved as well with less depressive feelings. Appetite has remained the same. Denies adverse effects.   Medication Access/Adherence  Patient reports affordability concerns with their medications: No  Patient reports access/transportation concerns to their pharmacy: No  Patient reports adherence concerns with their medications:  No     Diabetes:  Current medications:  Novolog 0-2 units (coffee), 6-8 units (lunch); 0-4 units (dinner - 0 units if skipping/only eating a non-starch vegetable)  Januvia 100 mg daily   Medications previously  tried: glipizide, metformin IR (diarrhea), Trulicity (cost)   SMBG: CGM (Libre 2) Average glucose: 7-day average (179 mg/dL); 1/91 - 4/78 (7-day average) 179 mg/dL Fasting usually around 130-140 mg/dL per patient report  Hypoglycemia: Patient reports hypoglycemic s/sx including dizziness, shakiness, sweating. Rarely over night. Most commonly occurring in the afternoon.   Diet:  Breakfast: skips. Coffee with 3 Splenda with sugar-free creamer - Usually uses 5 mg insulin after coffee if BG >200 mg/dL Lunch (29-56:21 pm): sandwich (tomatoes), sometimes chips - Checks BG (if <170 mg/dL will wait until after eating to give insulin -- wait for BG to go up) Dinner (6:30 pm): meat and 2 vegetables (low appetite, often will make dinner and find she is full after several bites) Snacks: May have small handful of chips  Current Reported Novolog Use: 10 am: Skips breakfast, only coffee with splena/sugar-free creamer. Then, mid-morning gives 5 units without eating because she notices her sugar increases ~200 mg/dL from her morning coffee. Noon: Gives 6-8 units of insulin. Sometimes skips lunch or eats lunch later in the afternoon but generally has sandwich. Waits to give insulin until after lunch once sugar reaches ~200 mg/dL.  Dinner: Usually eats only small portion of dinner and reports drops in sugar in the evening   Hyperlipidemia/ASCVD Risk Reduction (secondary prevention)  Current lipid lowering medications: atorvastatin 80 mg daily, ezetimibe 10 mg daily  Antiplatelet regimen: clopidogrel 75 mg daily (not taking, reports this was stopped for a procedure/surgery and she never restarted it). Reports it is not on her medication list.   ASCVD History: Yes, clinical ASCVD (managed by cardiology)   Heart Failure:  Current medications:  ACEi/ARB/ARNI: none, hypotension/orthostasis at baseline SGLT2i: none; orthostasis  Beta blocker: carvedilol 3.125 mg twice daily. Limited by  bradycardia Mineralocorticoid Receptor Antagonist: none Diuretic regimen: furosemide 20 mg PRN - has not needed recently    Depression/Insomnia:  Current medications: Mirtazapine 15 mg nightly (increased   Objective:  Lab Results  Component Value Date   HGBA1C 8.1 (A) 03/03/2023    Lab Results  Component Value Date   CREATININE 1.20 03/03/2023   BUN 20 03/03/2023   NA 137 03/03/2023   K 4.0 03/03/2023   CL 101 03/03/2023   CO2 29 03/03/2023    Lab Results  Component Value Date   CHOL 171 03/03/2023   HDL 46.50 03/03/2023   LDLCALC 93 03/03/2023   LDLDIRECT 52 08/28/2022   TRIG 158.0 (H) 03/03/2023   CHOLHDL 4 03/03/2023    Medications Reviewed Today   Medications were not reviewed in this encounter     Assessment/Plan:   Diabetes: uncontrolled with primary concern today of ongoing hypoglycemia. Last A1c 8.1% (03/03/23) with a reasonable goal < 7.5-8% given age, comorbidities, and risk of hypoglycemia at this time. CGM average glucose does show some improvement over the past month: 7-day average (179 mg/dL), 16-XWR (604 mg/dL), 54-UJW (119 mg/dL) suggesting J4N likely at/close to goal. Hypoglycemia is very likely secondary to Novolog administration after or without meals. We spent majority of the visit today reviewing insulin administration timing/onset of action, and how using insulin after meals/when not eating carbs can cause hypoglycemia later on. Continue Januvia 100 mg daily  Novolog: Compromised to decrease Novolog use with morning coffee (using 5 units). Reduce to max 2-3 units. Stop using AM insulin if any lows before lunch (skips breakfast) Stop using insulin mid-morning to 'correct' sugars, do not use insulin if skipping lunch Use 6-8 units of insulin with lunch containing carbs, avoid using insulin long after eating Use 0-4 units insulin with dinner (lean meat/veggie, no carbs, rarely eats more than 3 bites) Patient verbalized understanding of instructions  today, and wrote them down in a notebook. We agreed to phone check in in 1 week to tweak insulin as needed, especially around evening meal. Future consideration: Avoiding GLP1-RA given concern for low appetite/wt loss issues, hx acute pancreatitis SGLT2i deferred by cardiology d/t hx orthostasis Low dose metformin XR may be reasonable if renal function remains stable on future labs with eGFR consistently >30 mL/min   Hyperlipidemia/Clinical ASCVD:uncontrolled with last LDL 93 mg/dL on 03/19/55, goal <21 mg/dL. Currently managed by cardiology. Biggest focus at this time is glycemic control given hypoglycemia, defer further lipid management at this time. Tolerating statin/ezetimibe well. Confirms resumption of antiplatelet therapy. Current Regimen: atorvastatin 80 mg daily, ezetimibe 10 mg daily, clopidogrel 75 mg daily Continue current regimen   Heart Failure: managed by cardiology. CGMT optimization limited by HR as well as prior orthostasis concerns. No changes warranted at this time.  Current Regimen: carvedilol 3.125 mg BID, furosemide 20 mg (+ potassium chloride 10 mEq), Imdur 30 mg daily Continue current regimen    Depression/Sleep: Improved with recent increase of mirtazapine to 15 mg daily. Reports sleeping through the night/sleeping in until ~8 am (improved from consistently waking ~3 am). Also reports reduction in depressive mood. No change in appetite. Continue current medications.  Continue Mirtazapine 15 mg daily, duloxetine 60 mg daily    Follow Up Plan: phone call in 1 weeks for insulin adjustment as needed PCP follow up scheduled ~2 months on 06/03/23  Loree Fee, PharmD Clinical Pharmacist [facility] [phone]

## 2023-04-20 ENCOUNTER — Other Ambulatory Visit: Payer: Medicare PPO

## 2023-04-20 NOTE — Progress Notes (Deleted)
04/20/2023 Name: Sarah Payne MRN: [id number] DOB: [birth year]  No chief complaint on file.   Sarah Payne is a 77 y.o. year old female who presented for a telephone visit.   They were referred to the pharmacist by their PCP for assistance in managing diabetes. PMH also significant for CAD, CHF, COPD, OSA, hypothyroidism, CKD, hyperlipidemia, diabetic neuropathy, tobacco dependence.  Last Diabetes-Related Visit: 04/08/23 with Pharmacist  Care Team: Primary Care Provider: Doreene Nest, NP ; Next Scheduled Visit: 06/03/23 Cardiologist: Julien Nordmann, MD  Subjective: Appetite has remained the same, which is generally low. She states that this runs in the family. Denies adverse effects with current medications.   Medication Access/Adherence  Patient reports affordability concerns with their medications: No  Patient reports access/transportation concerns to their pharmacy: No  Patient reports adherence concerns with their medications:  No    Diabetes:  Current medications:  Novolog (using 5 units with coffee, 6 units with lunch)  (not taking, unclear why. Not in her medication basket)   Medications previously tried: glipizide, metformin IR (diarrhea), Trulicity (cost)   SMBG: CGM (Libre 2) ABG 7 Days: 179 mg/dL ABG 40-JWJX: 914  Patient-recorded BG log:   Fasting Pre/Post Lunch Pre/Post Dinner Bed  9/19 184 Coffee + 5 units 264 229 Lunch + 8 units 182 No dinner + 0 units    9/20 72 Coffee + 0 units No Lunch + 0 units No dinner + 0 units   9/21 214 Coffee + 8 units 146 241 Lunch + 6 units 145 161 Dinner + 8 units 281   9/23 173      9/24 284 Coffee + 8 units Lunch + 5 units 2 cheese dogs/fries 94 No dinner + 0 units Dropped to 45 103  9/25 178 Coffee + 5 units 207 354 Lunch + 8 units No dinner + 0 units 77  9/27 179 Coffee + 5 units 69 No lunch + 0 units 157 No lunch + 0 units 161    Hypoglycemia: Patient reports hypoglycemic s/sx including  dizziness, shakiness, sweating. Rarely over night. Most commonly occurring in the afternoon.   Diet:  Breakfast: skips. Coffee with 3 Splenda with sugar-free creamer - Usually uses 5 mg insulin after coffee if BG >200 mg/dL Lunch (78-29:56 pm): Skips or sandwich (tomato) Dinner (6:30 pm): Often skips or has meat and 2 vegetables (low appetite, often will make dinner and find she is full after several bites) Snacks: May have small handful of chips Drinks: Sweet tea (drinks a lot throughout the day. Makes this herself with regular sugar. Reports that it does not affect her sugars.)   Hyperlipidemia/ASCVD Risk Reduction (secondary prevention)  Current lipid lowering medications: atorvastatin 80 mg daily, ezetimibe 10 mg daily  Antiplatelet regimen: clopidogrel 75 mg daily   ASCVD History: Yes, clinical ASCVD (managed by cardiology)   Heart Failure:  Current medications:  ACEi/ARB/ARNI: none, hypotension/orthostasis at baseline SGLT2i: none; orthostasis Beta blocker: carvedilol 3.125 mg twice daily. Limited by bradycardia Mineralocorticoid Receptor Antagonist: none Diuretic regimen: furosemide 20 mg PRN - has not needed recently    Depression/Insomnia:  Current medications: Mirtazapine 15 mg nightly (increased   Objective:  Lab Results  Component Value Date   HGBA1C 8.1 (A) 03/03/2023    Lab Results  Component Value Date   CREATININE 1.20 03/03/2023   BUN 20 03/03/2023   NA 137 03/03/2023   K 4.0 03/03/2023   CL 101 03/03/2023   CO2 29 03/03/2023  Lab Results  Component Value Date   CHOL 171 03/03/2023   HDL 46.50 03/03/2023   LDLCALC 93 03/03/2023   LDLDIRECT 52 08/28/2022   TRIG 158.0 (H) 03/03/2023   CHOLHDL 4 03/03/2023    Medications Reviewed Today   Medications were not reviewed in this encounter     Assessment/Plan:   Diabetes: uncontrolled with primary concern today of ongoing hypoglycemia. Last A1c 8.1% (03/03/23) with a reasonable goal <  7.5-8% given age, comorbidities, and risk of hypoglycemia at this time. CGM average glucose does show some improvement over the past month: 7-day average (179 mg/dL), 16-XWR (604 mg/dL), 54-UJW (119 mg/dL) suggesting J4N likely at/close to goal. Hypoglycemia is very likely secondary to Novolog administration after or without meals. We spent majority of the visit today reviewing insulin administration timing/onset of action, and how using insulin after meals/when not eating carbs can cause hypoglycemia later on. Continue Januvia 100 mg daily  Novolog: Compromised to decrease Novolog use with morning coffee (using 5 units). Reduce to max 2-3 units. Stop using AM insulin if any lows before lunch (skips breakfast) Stop using insulin mid-morning to 'correct' sugars, do not use insulin if skipping lunch Use 6-8 units of insulin with lunch containing carbs, avoid using insulin long after eating Use 0-4 units insulin with dinner (lean meat/veggie, no carbs, rarely eats more than 3 bites) Patient verbalized understanding of instructions today, and wrote them down in a notebook. We agreed to phone check in in 1 week to tweak insulin as needed, especially around evening meal. Future consideration: Avoiding GLP1-RA given concern for low appetite/wt loss issues, hx acute pancreatitis SGLT2i deferred by cardiology d/t hx orthostasis Low dose metformin XR may be reasonable if renal function remains stable on future labs with eGFR consistently >30 mL/min   Hyperlipidemia/Clinical ASCVD:uncontrolled with last LDL 93 mg/dL on 03/19/55, goal <21 mg/dL. Currently managed by cardiology. Biggest focus at this time is glycemic control given hypoglycemia, defer further lipid management at this time. Tolerating statin/ezetimibe well. Notably, patient has not been taking any antiplatelet therapy since her procedure ~4 weeks ago. Pre-op procedural notes indicate intent to resume after procedure.  Current Regimen: atorvastatin 80  mg daily, ezetimibe 10 mg daily Not taking clopidogrel (as above) Continue current regimen, messaged pain md regarding antiplatelet resumption clarification   Heart Failure: managed by cardiology. CGMT optimization limited by HR as well as prior orthostasis concerns. No changes warranted at this time.  Current Regimen: carvedilol 3.125 mg BID, furosemide 20 mg (+ potassium chloride 10 mEq), Imdur 30 mg daily Today, patient reports also taking enalapril 10 mg daily and spironolactone 25 mg daily    Depression/Sleep: Improved with recent increase of mirtazapine to 15 mg daily. Reports sleeping through the night/sleeping in until ~8 am (improved from consistently waking ~3 am). Also reports reduction in depressive mood. No change in appetite. Continue current medications.  Continue Mirtazapine 15 mg daily, duloxetine 60 mg daily    Follow Up Plan: phone call in 1 weeks for insulin adjustment as needed PCP follow up scheduled 06/03/23  Loree Fee, PharmD Clinical Pharmacist [facility] [phone]

## 2023-04-23 ENCOUNTER — Ambulatory Visit: Payer: Medicare PPO | Admitting: Gastroenterology

## 2023-04-23 NOTE — Progress Notes (Deleted)
Wyline Mood MD, MRCP(U.K) [address]  [address]  [place], [place] [address]  Main: [phone]  Fax: [phone]   Primary Care Physician: Doreene Nest, NP  Primary Gastroenterologist:  Dr. Wyline Mood   No chief complaint on file.   HPI: Sarah Payne is a 77 y.o. female Summary of history : She was last seen at my office back in May 2020 for dysphagia.  I have dilated her empirically to 53 [place] and ruled out eosinophilic esophagitis.  At that point of time she was doing well she had poor dentition and was supposed to get false teeth.  Seen in 09/2024 for abnormal LFT's 09/19/2022: LFTs AST 538 ALT 371 gradual rise over the past 3 weeks.  GGT is also elevated at 56.  Hepatitis B surface antigen and hepatitis C virus antibody negative. Echo in 2023 showed no elevated pulmonary artery systolic pressure.     Interval history 09/24/2022-04/23/2023   10/01/2022: RUQ USG: no abnormalities of the liver 10/28/2022: LFT's normal  , in 09/2022: viral and autoimmune markers were normal 10/06/2022: EGD:  Normal.     She states she has lost 16 pounds of weight recently.  Throws up a lot of bile was recently started on 20 mg of Prilosec which she has taken just before bedtime.  Denies any alcohol use continues to smoke trying to cut down.  Denies any illegal drug use no tattoos.  No new medications except gabapentin. Current Outpatient Medications  Medication Sig Dispense Refill   albuterol (VENTOLIN HFA) 108 (90 Base) MCG/ACT inhaler Inhale 2 puffs into the lungs every 6 (six) hours as needed for wheezing or shortness of breath. 8 g 0   atorvastatin (LIPITOR) 80 MG tablet Take 80 mg by mouth daily.     carvedilol (COREG) 3.125 MG tablet Take 1 tablet (3.125 mg total) by mouth 2 (two) times daily. 180 tablet 3   clobetasol cream (TEMOVATE) 0.05 % Apply 1 Application topically 2 (two) times daily as needed. For vaginal itching. 30 g 0   clopidogrel (PLAVIX) 75 MG tablet Take 1 tablet  (75 mg total) by mouth daily. 90 tablet 3   Continuous Blood Gluc Receiver (FREESTYLE LIBRE 2 READER) DEVI Use with sensor to check blood sugars 6 times daily. 1 each 0   Continuous Glucose Sensor (FREESTYLE LIBRE 2 SENSOR) MISC USE ONE SENSOR EVERY 14 DAYS TO CHECK BLOOD SUGAR SIX TIMES PER DAY 6 each 1   DULoxetine (CYMBALTA) 60 MG capsule Take 60 mg by mouth daily.     ezetimibe (ZETIA) 10 MG tablet Take 1 tablet (10 mg total) by mouth daily. For cholesterol. (Patient not taking: Reported on 04/20/2023) 90 tablet 3   famotidine (PEPCID) 20 MG tablet Take 1 tablet (20 mg total) by mouth at bedtime. For nausea and acid 90 tablet 0   furosemide (LASIX) 20 MG tablet Take 1 tablet (20 mg total) by mouth as needed (As needed for weight gain greater than 3 pounds overnight or increased shortness of breath.). Take one tab only on Monday, Wednesday, & Fridays 90 tablet 3   gabapentin (NEURONTIN) 300 MG capsule TAKE 1 CAPSULE BY MOUTH TWICE DAILY FOR PAIN 180 capsule 3   Glucagon (GVOKE HYPOPEN 2-PACK) 1 MG/0.2ML SOAJ Inject 1 mg (1 pen) as needed for severe low blood sugar (sustained glucose less than 55 despite oral glucose treatments). May repeat in 15 minutes as needed. 0.4 mL 1   insulin aspart (NOVOLOG FLEXPEN) 100 UNIT/ML  FlexPen Inject 6 to 16 units three times daily with meals per sliding scale for diabetes 45 mL 0   Insulin Pen Needle (INSUPEN PEN NEEDLES) 32G X 4 MM MISC Use to inject insulin 4 times daily. 250 each 1   isosorbide mononitrate (IMDUR) 30 MG 24 hr tablet Take 30 mg by mouth daily.     levothyroxine (SYNTHROID) 100 MCG tablet Take 1 tablet by mouth every morning on an empty stomach with water only.  No food or other medications for 30 minutes. 90 tablet 0   mirtazapine (REMERON) 15 MG tablet Take 1 tablet (15 mg total) by mouth at bedtime. For sleep and depression 90 tablet 3   omeprazole (PRILOSEC) 40 MG capsule Take 1 capsule (40 mg total) by mouth daily. 90 capsule 3   ondansetron  (ZOFRAN) 4 MG tablet Take 1 tablet (4 mg total) by mouth every 6 (six) hours as needed for nausea. 15 tablet 0   ondansetron (ZOFRAN-ODT) 4 MG disintegrating tablet Take 1 tablet (4 mg total) by mouth every 8 (eight) hours as needed for nausea or vomiting. 20 tablet 0   potassium chloride (KLOR-CON M) 10 MEQ tablet Only take on days you take your lasix. Take 1 tablet (10 mEq total) by mouth for 1 dose on those days you take your lasix 20mg  pill. 90 tablet 3   sitaGLIPtin (JANUVIA) 100 MG tablet Take 100 mg by mouth daily.     umeclidinium-vilanterol (ANORO ELLIPTA) 62.5-25 MCG/ACT AEPB Inhale 1 puff into the lungs daily. 60 each 5   No current facility-administered medications for this visit.    Allergies as of 04/23/2023   (No Known Allergies)      ROS:  General: Negative for anorexia, weight loss, fever, chills, fatigue, weakness. ENT: Negative for hoarseness, difficulty swallowing , nasal congestion. CV: Negative for chest pain, angina, palpitations, dyspnea on exertion, peripheral edema.  Respiratory: Negative for dyspnea at rest, dyspnea on exertion, cough, sputum, wheezing.  GI: See history of present illness. GU:  Negative for dysuria, hematuria, urinary incontinence, urinary frequency, nocturnal urination.  Endo: Negative for unusual weight change.    Physical Examination:   There were no vitals taken for this visit.  General: Well-nourished, well-developed in no acute distress.  Eyes: No icterus. Conjunctivae pink. Mouth: Oropharyngeal mucosa moist and pink , no lesions erythema or exudate. Lungs: Clear to auscultation bilaterally. Non-labored. Heart: Regular rate and rhythm, no murmurs rubs or gallops.  Abdomen: Bowel sounds are normal, nontender, nondistended, no hepatosplenomegaly or masses, no abdominal bruits or hernia , no rebound or guarding.   Extremities: No lower extremity edema. No clubbing or deformities. Neuro: Alert and oriented x 3.  Grossly intact. Skin:  Warm and dry, no jaundice.   Psych: Alert and cooperative, normal mood and affect.   Imaging Studies: No results found.  Assessment and Plan:   Sarah Payne is a 77 y.o. y/o female  for abnormal LFTs.  In addition has lost over 16 pounds of weight recently CT chest abdomen pelvis shows no abnormality.   Plan 1.   Abnormal weight loss suggested colonoscopy she is not keen on a colonoscopy 4.  For her regurgitation commence on Prilosec 40 mg twice daily new prescription will be provided 5.  If regurgitation persists next visit and no abnormality seen will proceed with HIDA scan.  Will try to reiterate the importance of a colonoscopy for weight loss at next visit      Dr Wyline Mood  MD,MRCP (  U.K) Follow up in ***

## 2023-04-29 ENCOUNTER — Ambulatory Visit
Payer: Medicare PPO | Attending: Student in an Organized Health Care Education/Training Program | Admitting: Student in an Organized Health Care Education/Training Program

## 2023-05-06 ENCOUNTER — Ambulatory Visit: Payer: Medicare PPO | Admitting: Student in an Organized Health Care Education/Training Program

## 2023-05-06 ENCOUNTER — Encounter: Payer: Self-pay | Admitting: Student in an Organized Health Care Education/Training Program

## 2023-05-06 ENCOUNTER — Ambulatory Visit
Admission: RE | Admit: 2023-05-06 | Discharge: 2023-05-06 | Disposition: A | Discharge status: Home / Self Care | Payer: Medicare PPO | Source: Ambulatory Visit | Attending: Student in an Organized Health Care Education/Training Program | Admitting: Student in an Organized Health Care Education/Training Program

## 2023-05-06 ENCOUNTER — Other Ambulatory Visit: Payer: Self-pay | Admitting: Student in an Organized Health Care Education/Training Program

## 2023-05-06 VITALS — BP 158/88 | HR 75 | Temp 97.3°F | Resp 15 | Ht 66.0 in | Wt 125.0 lb

## 2023-05-06 DIAGNOSIS — M5412 Radiculopathy, cervical region: Secondary | ICD-10-CM | POA: Insufficient documentation

## 2023-05-06 DIAGNOSIS — M48061 Spinal stenosis, lumbar region without neurogenic claudication: Secondary | ICD-10-CM | POA: Insufficient documentation

## 2023-05-06 DIAGNOSIS — M5416 Radiculopathy, lumbar region: Secondary | ICD-10-CM | POA: Insufficient documentation

## 2023-05-06 DIAGNOSIS — Z981 Arthrodesis status: Secondary | ICD-10-CM | POA: Diagnosis not present

## 2023-05-06 DIAGNOSIS — M961 Postlaminectomy syndrome, not elsewhere classified: Secondary | ICD-10-CM

## 2023-05-06 DIAGNOSIS — M48062 Spinal stenosis, lumbar region with neurogenic claudication: Secondary | ICD-10-CM | POA: Diagnosis present

## 2023-05-06 DIAGNOSIS — R52 Pain, unspecified: Secondary | ICD-10-CM

## 2023-05-06 DIAGNOSIS — G8929 Other chronic pain: Secondary | ICD-10-CM | POA: Insufficient documentation

## 2023-05-06 DIAGNOSIS — M5134 Other intervertebral disc degeneration, thoracic region: Secondary | ICD-10-CM | POA: Insufficient documentation

## 2023-05-06 DIAGNOSIS — M51369 Other intervertebral disc degeneration, lumbar region without mention of lumbar back pain or lower extremity pain: Secondary | ICD-10-CM | POA: Insufficient documentation

## 2023-05-06 MED ORDER — DEXAMETHASONE SODIUM PHOSPHATE 10 MG/ML IJ SOLN
INTRAMUSCULAR | Status: AC
  Filled 2023-05-06: qty 2

## 2023-05-06 MED ORDER — LIDOCAINE HCL 2 % IJ SOLN
20.0000 mL | Freq: Once | INTRAMUSCULAR | Status: AC
  Administered 2023-05-06: 400 mg

## 2023-05-06 MED ORDER — SODIUM CHLORIDE (PF) 0.9 % IJ SOLN
INTRAMUSCULAR | Status: AC
  Filled 2023-05-06: qty 10

## 2023-05-06 MED ORDER — LIDOCAINE HCL 2 % IJ SOLN
INTRAMUSCULAR | Status: AC
  Filled 2023-05-06: qty 20

## 2023-05-06 MED ORDER — DEXAMETHASONE SODIUM PHOSPHATE 10 MG/ML IJ SOLN
10.0000 mg | Freq: Once | INTRAMUSCULAR | Status: AC
  Administered 2023-05-06: 10 mg

## 2023-05-06 MED ORDER — IOHEXOL 180 MG/ML  SOLN
10.0000 mL | Freq: Once | INTRAMUSCULAR | Status: AC
  Administered 2023-05-06: 10 mL via EPIDURAL

## 2023-05-06 MED ORDER — IOHEXOL 180 MG/ML  SOLN
INTRAMUSCULAR | Status: AC
  Filled 2023-05-06: qty 20

## 2023-05-06 MED ORDER — ROPIVACAINE HCL 2 MG/ML IJ SOLN
9.0000 mL | Freq: Once | INTRAMUSCULAR | Status: AC
  Administered 2023-05-06: 9 mL via PERINEURAL

## 2023-05-06 MED ORDER — SODIUM CHLORIDE 0.9% FLUSH
2.0000 mL | Freq: Once | INTRAVENOUS | Status: AC
  Administered 2023-05-06: 2 mL

## 2023-05-06 MED ORDER — ROPIVACAINE HCL 2 MG/ML IJ SOLN
INTRAMUSCULAR | Status: AC
  Filled 2023-05-06: qty 20

## 2023-05-06 NOTE — Progress Notes (Signed)
PROVIDER NOTE: Interpretation of information contained herein should be left to medically-trained personnel. Specific patient instructions are provided elsewhere under "Patient Instructions" section of medical record. This document was created in part using STT-dictation technology, any transcriptional errors that may result from this process are unintentional.  Patient: Sarah Payne Type: Established DOB: [birth year] MRN: [id number] PCP: Doreene Nest, NP  Service: Procedure DOS: 05/06/2023 Setting: Ambulatory Location: Ambulatory outpatient facility Delivery: Face-to-face Provider: Edward Jolly, MD Specialty: Interventional Pain Management Specialty designation: 09 Location: Outpatient facility Ref. Prov.: Doreene Nest, NP       Interventional Therapy   Procedure: Cervical Epidural Steroid injection (CESI) (Interlaminar) #2  Laterality: Left  Level: C7-T1 Imaging: Fluoroscopy-assisted DOS: 05/06/2023  Performed by: Edward Jolly, MD Anesthesia: Local anesthesia (1-2% Lidocaine)   Purpose: Therapeutic Indications: Cervicalgia, cervical radicular pain, degenerative disc disease, severe enough to impact quality of life or function.  NAS-11 score:   Pre-procedure: 7 /10   Post-procedure: 7 /10      Position  Prep  Materials:  Location setting: Procedure suite Position: Prone, on modified reverse trendelenburg to facilitate breathing, with head in head-cradle. Pillows positioned under chest (below chin-level) with cervical spine flexed. Safety Precautions: Patient was assessed for positional comfort and pressure points before starting the procedure. Prepping solution: DuraPrep (Iodine Povacrylex [0.7% available iodine] and Isopropyl Alcohol, 74% w/w) Prep Area: Entire  cervicothoracic region Approach: percutaneous, paramedial Intended target: Posterior cervical epidural space Materials Procedure:  Tray: Epidural Needle(s): Epidural (Tuohy) Qty: 1 Length: (90mm)  3.5-inch Gauge: 22G   H&P (Pre-op Assessment):  Sarah Payne is a 77 y.o. (year old), female patient, seen today for interventional treatment. She  has a past surgical history that includes Back surgery; Appendectomy; Abdominal hysterectomy; Colonoscopy (N/A, 11/02/2014); Cataract extraction w/PHACO (Left, 01/30/2015); Coronary angioplasty with stent (Left, 02/06/2011); Coronary angioplasty with stent (Left, 09/17/2011); Cataract extraction w/PHACO (Right, 02/13/2015); Cardiac catheterization (N/A, 01/01/2016); Esophagogastroduodenoscopy (egd) with propofol (N/A, 09/21/2018); Shoulder surgery (Left, 2017); ABDOMINAL AORTOGRAM W/LOWER EXTREMITY (N/A, 09/12/2020); PERIPHERAL VASCULAR ATHERECTOMY (Left, 09/12/2020); PERIPHERAL VASCULAR BALLOON ANGIOPLASTY (09/12/2020); Coronary angioplasty with stent (Left, 10/02/2011); Coronary angioplasty with stent (Left, 08/19/2012); LEFT HEART CATH AND CORONARY ANGIOGRAPHY (Left, 08/02/2014); Reverse shoulder arthroplasty (Left, 10/01/2021); Eye surgery; and Esophagogastroduodenoscopy (egd) with propofol (N/A, 10/06/2022). Sarah Payne has a current medication list which includes the following prescription(s): albuterol, atorvastatin, carvedilol, clobetasol cream, clopidogrel, freestyle libre 2 reader, freestyle libre 2 sensor, duloxetine, ezetimibe, famotidine, furosemide, gabapentin, gvoke hypopen 2-pack, novolog flexpen, insupen pen needles, isosorbide mononitrate, levothyroxine, mirtazapine, omeprazole, ondansetron, ondansetron, potassium chloride, sitagliptin, and umeclidinium-vilanterol. Her primarily concern today is the Back Pain and Neck Pain  Initial Vital Signs:  Pulse/HCG Rate: 74  Temp: (!) 97.3 F (36.3 C) Resp: 18 BP: 139/82 SpO2: 100 %  BMI: Estimated body mass index is 20.18 kg/m as calculated from the following:   Height as of this encounter: 5\' 6"  (1.676 m).   Weight as of this encounter: 125 lb (56.7 kg).  Risk Assessment: Allergies: Reviewed. She  has No Known Allergies.  Allergy Precautions: None required Coagulopathies: Reviewed. None identified.  Blood-thinner therapy: None at this time Active Infection(s): Reviewed. None identified. Sarah Payne is afebrile  Site Confirmation: Ms. Gerardi was asked to confirm the procedure and laterality before marking the site Procedure checklist: Completed Consent: Before the procedure and under the influence of no sedative(s), amnesic(s), or anxiolytics, the patient was informed of the treatment options, risks and possible complications. To fulfill our ethical and legal obligations, as recommended  by the American Medical Association's Code of Ethics, I have informed the patient of my clinical impression; the nature and purpose of the treatment or procedure; the risks, benefits, and possible complications of the intervention; the alternatives, including doing nothing; the risk(s) and benefit(s) of the alternative treatment(s) or procedure(s); and the risk(s) and benefit(s) of doing nothing. The patient was provided information about the general risks and possible complications associated with the procedure. These may include, but are not limited to: failure to achieve desired goals, infection, bleeding, organ or nerve damage, allergic reactions, paralysis, and death. In addition, the patient was informed of those risks and complications associated to Spine-related procedures, such as failure to decrease pain; infection (i.e.: Meningitis, epidural or intraspinal abscess); bleeding (i.e.: epidural hematoma, subarachnoid hemorrhage, or any other type of intraspinal or peri-dural bleeding); organ or nerve damage (i.e.: Any type of peripheral nerve, nerve root, or spinal cord injury) with subsequent damage to sensory, motor, and/or autonomic systems, resulting in permanent pain, numbness, and/or weakness of one or several areas of the body; allergic reactions; (i.e.: anaphylactic reaction); and/or death. Furthermore,  the patient was informed of those risks and complications associated with the medications. These include, but are not limited to: allergic reactions (i.e.: anaphylactic or anaphylactoid reaction(s)); adrenal axis suppression; blood sugar elevation that in diabetics may result in ketoacidosis or comma; water retention that in patients with history of congestive heart failure may result in shortness of breath, pulmonary edema, and decompensation with resultant heart failure; weight gain; swelling or edema; medication-induced neural toxicity; particulate matter embolism and blood vessel occlusion with resultant organ, and/or nervous system infarction; and/or aseptic necrosis of one or more joints. Finally, the patient was informed that Medicine is not an exact science; therefore, there is also the possibility of unforeseen or unpredictable risks and/or possible complications that may result in a catastrophic outcome. The patient indicated having understood very clearly. We have given the patient no guarantees and we have made no promises. Enough time was given to the patient to ask questions, all of which were answered to the patient's satisfaction. Ms. Schellin has indicated that she wanted to continue with the procedure. Attestation: I, the ordering provider, attest that I have discussed with the patient the benefits, risks, side-effects, alternatives, likelihood of achieving goals, and potential problems during recovery for the procedure that I have provided informed consent. Date  Time: 05/06/2023  9:38 AM   Pre-Procedure Preparation:  Monitoring: As per clinic protocol. Respiration, ETCO2, SpO2, BP, heart rate and rhythm monitor placed and checked for adequate function Safety Precautions: Patient was assessed for positional comfort and pressure points before starting the procedure. Time-out: I initiated and conducted the "Time-out" before starting the procedure, as per protocol. The patient was asked to  participate by confirming the accuracy of the "Time Out" information. Verification of the correct person, site, and procedure were performed and confirmed by me, the nursing staff, and the patient. "Time-out" conducted as per Joint Commission's Universal Protocol (UP.01.01.01). Time: 1010 Start Time: 1010 hrs.  Description  Narrative of Procedure:          Rationale (medical necessity): procedure needed and proper for the diagnosis and/or treatment of the patient's medical symptoms and needs. Start Time: 1010 hrs. Safety Precautions: Aspiration looking for blood return was conducted prior to all injections. At no point did we inject any substances, as a needle was being advanced. No attempts were made at seeking any paresthesias. Safe injection practices and needle disposal techniques used.  Medications properly checked for expiration dates. SDV (single dose vial) medications used. Description of procedure: Protocol guidelines were followed. The patient was assisted into a comfortable position. The target area was identified and the area prepped in the usual manner. Skin & deeper tissues infiltrated with local anesthetic. Appropriate amount of time allowed to pass for local anesthetics to take effect. Using fluoroscopic guidance, the epidural needle was introduced through the skin, ipsilateral to the reported pain, and advanced to the target area. Posterior laminar os was contacted and the needle walked caudad, until the lamina was cleared. The ligamentum flavum was engaged and the epidural space identified using "loss-of-resistance technique" with 2-3 ml of PF-NaCl (0.9% NSS), in a 5cc dedicated LOR syringe. (See "Imaging guidance" below for use of contrast details.) Once proper needle placement was secured, and negative aspiration confirmed, the solution was injected in intermittent fashion, asking for systemic symptoms every 0.5cc. The needles were then removed and the area cleansed, making sure to leave  some of the prepping solution back to take advantage of its long term bactericidal properties.  3 cc solution made of 1 cc of preservative-free saline, 1 cc of 0.2% ropivacaine, 1 cc of Decadron 10 mg/cc.   Vitals:   05/06/23 0945 05/06/23 1007 05/06/23 1012 05/06/23 1017  BP: 139/82 (!) 155/99 (!) 161/89 (!) 158/88  Pulse: 74 85 79 75  Resp: 18 17 19 15   Temp: (!) 97.3 F (36.3 C)     SpO2: 100% 100% 97% 99%  Weight: 125 lb (56.7 kg)     Height: 5\' 6"  (1.676 m)        End Time: 1018 hrs.  Imaging Guidance (Spinal):          Type of Imaging Technique: Fluoroscopy Guidance (Spinal) Indication(s): Assistance in needle guidance and placement for procedures requiring needle placement in or near specific anatomical locations not easily accessible without such assistance. Exposure Time: Please see nurses notes. Contrast: Before injecting any contrast, we confirmed that the patient did not have an allergy to iodine, shellfish, or radiological contrast. Once satisfactory needle placement was completed at the desired level, radiological contrast was injected. Contrast injected under live fluoroscopy. No contrast complications. See chart for type and volume of contrast used. Fluoroscopic Guidance: I was personally present during the use of fluoroscopy. "Tunnel Vision Technique" used to obtain the best possible view of the target area. Parallax error corrected before commencing the procedure. "Direction-depth-direction" technique used to introduce the needle under continuous pulsed fluoroscopy. Once target was reached, antero-posterior, oblique, and lateral fluoroscopic projection used confirm needle placement in all planes. Images permanently stored in EMR. Interpretation: I personally interpreted the imaging intraoperatively. Adequate needle placement confirmed in multiple planes. Appropriate spread of contrast into desired area was observed. No evidence of afferent or efferent intravascular uptake.  No intrathecal or subarachnoid spread observed. Permanent images saved into the patient's record.  Post-operative Assessment:  Post-procedure Vital Signs:  Pulse/HCG Rate: 75  Temp: (!) 97.3 F (36.3 C) Resp: 15 BP: (!) 158/88 SpO2: 99 %  EBL: None  Complications: No immediate post-treatment complications observed by team, or reported by patient.  Note: The patient tolerated the entire procedure well. A repeat set of vitals were taken after the procedure and the patient was kept under observation following institutional policy, for this type of procedure. Post-procedural neurological assessment was performed, showing return to baseline, prior to discharge. The patient was provided with post-procedure discharge instructions, including a section on how to identify potential problems.  Should any problems arise concerning this procedure, the patient was given instructions to immediately contact us, at any time, without hesitation. In any case, we plan to contact the patient by telephone for a follow-up status report regarding this interventional procedure.  Comments:  No additional relevant information.  Plan of Care (POC)  Orders:  Orders Placed This Encounter  Procedures   MR THORACIC SPINE WO CONTRAST    Patient presents with axial pain with possible radicular component. Please assist [place] in identifying specific level(s) and laterality of any additional findings such as: 1. Facet (Zygapophyseal) joint DJD (Hypertrophy, space narrowing, subchondral sclerosis, and/or osteophyte formation) 2. DDD and/or IVDD (Loss of disc height, desiccation, gas patterns, osteophytes, endplate sclerosis, or "Black disc disease") 3. Pars defects 4. Spondylolisthesis, spondylosis, and/or spondyloarthropathies (include Degree/Grade of displacement in mm) (stability) 5. Vertebral body Fractures (acute/chronic) (state percentage of collapse) 6. Demineralization (osteopenia/osteoporotic) 7. Bone pathology 8.  Foraminal narrowing  9. Surgical changes 10. Central, Lateral Recess, and/or Foraminal Stenosis (include AP diameter of stenosis in mm) 11. Surgical changes (hardware type, status, and presence of fibrosis) 12. Modic Type Changes (MRI only) 13. IVDD (Disc bulge, protrusion, herniation, extrusion) (Level, laterality, extent)    Standing Status:   Future    Standing Expiration Date:   06/06/2023    Scheduling Instructions:     Please make sure that the patient understands that this needs to be done as soon as possible. Never have the patient do the imaging "just before the next appointment". Inform patient that having the imaging done within the [facility] will expedite the availability of the results and will provide      imaging availability to the requesting physician. In addition inform the patient that the imaging order has an expiration date and will not be renewed if not done within the active period.    Order Specific Question:   What is the patient's sedation requirement?    Answer:   No Sedation    Order Specific Question:   Does the patient have a pacemaker or implanted devices?    Answer:   No    Order Specific Question:   Preferred imaging location?    Answer:   [facility] (table limit-350lbs)    Order Specific Question:   Call Results- Best Contact Number?    Answer:   [phone] [facility])    Order Specific Question:   Radiology Contrast Protocol - do NOT remove file path    Answer:   [email or web address]   Ambulatory referral to Psychology    Referral Priority:   Routine    Referral Type:   Psychiatric    Referral Reason:   Specialty Services Required    Requested Specialty:   Psychology    Number of Visits Requested:   1    Medications ordered for procedure: Meds ordered this encounter  Medications   lidocaine (XYLOCAINE) 2 % (with pres) injection 400 mg   dexamethasone (DECADRON) injection 10 mg   dexamethasone  (DECADRON) injection 10 mg   ropivacaine (PF) 2 mg/mL (0.2%) (NAROPIN) injection 9 mL   sodium chloride flush (NS) 0.9 % injection 2 mL   iohexol (OMNIPAQUE) 180 MG/ML injection 10 mL    Must be Myelogram-compatible. If not available, you may substitute with a water-soluble, non-ionic, hypoallergenic, myelogram-compatible radiological contrast medium.   Medications administered: We administered lidocaine, dexamethasone, dexamethasone, ropivacaine (PF) 2 mg/mL (0.2%), sodium chloride flush, and iohexol.  See the medical record for exact dosing, route,  and time of administration.  Follow-up plan:   Return for keep scheduled appt .       Recent Visits Date Type Provider Dept  04/09/23 Office Visit Edward Jolly, MD [facility]  03/04/23 Procedure visit Edward Jolly, MD [facility]  02/17/23 Office Visit Edward Jolly, MD [facility]  Showing recent visits within past 90 days and meeting all other requirements Today's Visits Date Type Provider Dept  05/06/23 Procedure visit Edward Jolly, MD [facility]  Showing today's visits and meeting all other requirements Future Appointments Date Type Provider Dept  06/08/23 Appointment Edward Jolly, MD [facility]  Showing future appointments within next 90 days and meeting all other requirements  Disposition: Discharge home  Discharge (Date  Time): 05/06/2023;   hrs.   Primary Care Physician: Doreene Nest, NP Location: [facility] Note by: Edward Jolly, MD (TTS technology used. I apologize for any typographical errors that were not detected and corrected.) Date: 05/06/2023; Time: 11:13 AM  Disclaimer:  Medicine is not an Visual merchandiser. The only guarantee in medicine is that nothing is guaranteed. It is important to note that the decision to proceed with this intervention was based on the information collected from the patient. The Data and conclusions  were drawn from the patient's questionnaire, the interview, and the physical examination. Because the information was provided in large part by the patient, it cannot be guaranteed that it has not been purposely or unconsciously manipulated. Every effort has been made to obtain as much relevant data as possible for this evaluation. It is important to note that the conclusions that lead to this procedure are derived in large part from the available data. Always take into account that the treatment will also be dependent on availability of resources and existing treatment guidelines, considered by other Pain Management Practitioners as being common knowledge and practice, at the time of the intervention. For Medico-Legal purposes, it is also important to point out that variation in procedural techniques and pharmacological choices are the acceptable norm. The indications, contraindications, technique, and results of the above procedure should only be interpreted and judged by a Board-Certified Interventional Pain Specialist with extensive familiarity and expertise in the same exact procedure and technique.

## 2023-05-06 NOTE — Progress Notes (Signed)
Safety precautions to be maintained throughout the outpatient stay will include: orient to surroundings, keep bed in low position, maintain call bell within reach at all times, provide assistance with transfer out of bed and ambulation.  

## 2023-05-06 NOTE — Progress Notes (Signed)
PROVIDER NOTE: Interpretation of information contained herein should be left to medically-trained personnel. Specific patient instructions are provided elsewhere under "Patient Instructions" section of medical record. This document was created in part using STT-dictation technology, any transcriptional errors that may result from this process are unintentional.  Patient: Sarah Payne Type: Established DOB: 07-Nov-1945 MRN: [id number] PCP: Doreene Nest, NP  Service: Procedure DOS: 05/06/2023 Setting: Ambulatory Location: Ambulatory outpatient facility Delivery: Face-to-face Provider: Edward Jolly, MD Specialty: Interventional Pain Management Specialty designation: 09 Location: Outpatient facility Ref. Prov.: Doreene Nest, NP       Interventional Therapy   Procedure: Lumbar epidural steroid injection (LESI) (interlaminar) #2    Laterality: Midline   Level:  L2-3 Level.  Imaging: Fluoroscopic guidance         Anesthesia: Local anesthesia (1-2% Lidocaine) DOS: 05/06/2023  Performed by: Edward Jolly, MD  Purpose: Diagnostic/Therapeutic Indications: Lumbar radicular pain of intraspinal etiology of more than 4 weeks that has failed to respond to conservative therapy and is severe enough to impact quality of life or function.  NAS-11 Pain score:   Pre-procedure: 7 /10   Post-procedure: 7 /10      Position / Prep / Materials:  Position: Prone w/ head of the table raised (slight reverse trendelenburg) to facilitate breathing.  Prep solution: DuraPrep (Iodine Povacrylex [0.7% available iodine] and Isopropyl Alcohol, 74% w/w) Prep Area: Entire Posterior Lumbar Region from lower scapular tip down to mid buttocks area and from flank to flank. Materials:  Tray: Epidural tray Needle(s):  Type: Epidural needle (Tuohy) Gauge (G):  22 Length: Regular (3.5-in) Qty: 1   H&P (Pre-op Assessment):  Sarah Payne is a 77 y.o. (year old), female patient, seen today for interventional  treatment. She  has a past surgical history that includes Back surgery; Appendectomy; Abdominal hysterectomy; Colonoscopy (N/A, 11/02/2014); Cataract extraction w/PHACO (Left, 01/30/2015); Coronary angioplasty with stent (Left, 02/06/2011); Coronary angioplasty with stent (Left, 09/17/2011); Cataract extraction w/PHACO (Right, 02/13/2015); Cardiac catheterization (N/A, 01/01/2016); Esophagogastroduodenoscopy (egd) with propofol (N/A, 09/21/2018); Shoulder surgery (Left, 2017); ABDOMINAL AORTOGRAM W/LOWER EXTREMITY (N/A, 09/12/2020); PERIPHERAL VASCULAR ATHERECTOMY (Left, 09/12/2020); PERIPHERAL VASCULAR BALLOON ANGIOPLASTY (09/12/2020); Coronary angioplasty with stent (Left, 10/02/2011); Coronary angioplasty with stent (Left, 08/19/2012); LEFT HEART CATH AND CORONARY ANGIOGRAPHY (Left, 08/02/2014); Reverse shoulder arthroplasty (Left, 10/01/2021); Eye surgery; and Esophagogastroduodenoscopy (egd) with propofol (N/A, 10/06/2022). Sarah Payne has a current medication list which includes the following prescription(s): albuterol, atorvastatin, carvedilol, clobetasol cream, clopidogrel, freestyle libre 2 reader, freestyle libre 2 sensor, duloxetine, ezetimibe, famotidine, furosemide, gabapentin, gvoke hypopen 2-pack, novolog flexpen, insupen pen needles, isosorbide mononitrate, levothyroxine, mirtazapine, omeprazole, ondansetron, ondansetron, potassium chloride, sitagliptin, and umeclidinium-vilanterol. Her primarily concern today is the Back Pain and Neck Pain  Initial Vital Signs:  Pulse/HCG Rate: 74  Temp: (!) 97.3 F (36.3 C) Resp: 18 BP: 139/82 SpO2: 100 %  BMI: Estimated body mass index is 20.18 kg/m as calculated from the following:   Height as of this encounter: 5\' 6"  (1.676 m).   Weight as of this encounter: 125 lb (56.7 kg).  Risk Assessment: Allergies: Reviewed. She has No Known Allergies.  Allergy Precautions: None required Coagulopathies: Reviewed. None identified.  Blood-thinner therapy:  None at this time Active Infection(s): Reviewed. None identified. Ms. Craze is afebrile  Site Confirmation: Ms. Jobst was asked to confirm the procedure and laterality before marking the site Procedure checklist: Completed Consent: Before the procedure and under the influence of no sedative(s), amnesic(s), or anxiolytics, the patient was informed of the treatment options, risks  and possible complications. To fulfill our ethical and legal obligations, as recommended by the American Medical Association's Code of Ethics, I have informed the patient of my clinical impression; the nature and purpose of the treatment or procedure; the risks, benefits, and possible complications of the intervention; the alternatives, including doing nothing; the risk(s) and benefit(s) of the alternative treatment(s) or procedure(s); and the risk(s) and benefit(s) of doing nothing. The patient was provided information about the general risks and possible complications associated with the procedure. These may include, but are not limited to: failure to achieve desired goals, infection, bleeding, organ or nerve damage, allergic reactions, paralysis, and death. In addition, the patient was informed of those risks and complications associated to Spine-related procedures, such as failure to decrease pain; infection (i.e.: Meningitis, epidural or intraspinal abscess); bleeding (i.e.: epidural hematoma, subarachnoid hemorrhage, or any other type of intraspinal or peri-dural bleeding); organ or nerve damage (i.e.: Any type of peripheral nerve, nerve root, or spinal cord injury) with subsequent damage to sensory, motor, and/or autonomic systems, resulting in permanent pain, numbness, and/or weakness of one or several areas of the body; allergic reactions; (i.e.: anaphylactic reaction); and/or death. Furthermore, the patient was informed of those risks and complications associated with the medications. These include, but are not limited to:  allergic reactions (i.e.: anaphylactic or anaphylactoid reaction(s)); adrenal axis suppression; blood sugar elevation that in diabetics may result in ketoacidosis or comma; water retention that in patients with history of congestive heart failure may result in shortness of breath, pulmonary edema, and decompensation with resultant heart failure; weight gain; swelling or edema; medication-induced neural toxicity; particulate matter embolism and blood vessel occlusion with resultant organ, and/or nervous system infarction; and/or aseptic necrosis of one or more joints. Finally, the patient was informed that Medicine is not an exact science; therefore, there is also the possibility of unforeseen or unpredictable risks and/or possible complications that may result in a catastrophic outcome. The patient indicated having understood very clearly. We have given the patient no guarantees and we have made no promises. Enough time was given to the patient to ask questions, all of which were answered to the patient's satisfaction. Ms. Giliberto has indicated that she wanted to continue with the procedure. Attestation: I, the ordering provider, attest that I have discussed with the patient the benefits, risks, side-effects, alternatives, likelihood of achieving goals, and potential problems during recovery for the procedure that I have provided informed consent. Date  Time: 05/06/2023  9:38 AM   Pre-Procedure Preparation:  Monitoring: As per clinic protocol. Respiration, ETCO2, SpO2, BP, heart rate and rhythm monitor placed and checked for adequate function Safety Precautions: Patient was assessed for positional comfort and pressure points before starting the procedure. Time-out: I initiated and conducted the "Time-out" before starting the procedure, as per protocol. The patient was asked to participate by confirming the accuracy of the "Time Out" information. Verification of the correct person, site, and procedure were  performed and confirmed by me, the nursing staff, and the patient. "Time-out" conducted as per Joint Commission's Universal Protocol (UP.01.01.01). Time: 1010 Start Time: 1010 hrs.  Description/Narrative of Procedure:          Target: Epidural space via interlaminar opening, initially targeting the lower laminar border of the superior vertebral body. Region: Lumbar Approach: Percutaneous paravertebral  Rationale (medical necessity): procedure needed and proper for the diagnosis and/or treatment of the patient's medical symptoms and needs. Procedural Technique Safety Precautions: Aspiration looking for blood return was conducted prior to all  injections. At no point did we inject any substances, as a needle was being advanced. No attempts were made at seeking any paresthesias. Safe injection practices and needle disposal techniques used. Medications properly checked for expiration dates. SDV (single dose vial) medications used. Description of the Procedure: Protocol guidelines were followed. The procedure needle was introduced through the skin, ipsilateral to the reported pain, and advanced to the target area. Bone was contacted and the needle walked caudad, until the lamina was cleared. The epidural space was identified using "loss-of-resistance technique" with 2-3 ml of PF-NaCl (0.9% NSS), in a 5cc LOR glass syringe.  5 cc solution made of 2 cc of preservative-free saline, 2 cc of 0.2% ropivacaine, 1 cc of Decadron 10 mg/cc.   Vitals:   05/06/23 0945 05/06/23 1007 05/06/23 1012 05/06/23 1017  BP: 139/82 (!) 155/99 (!) 161/89 (!) 158/88  Pulse: 74 85 79 75  Resp: 18 17 19 15   Temp: (!) 97.3 F (36.3 C)     SpO2: 100% 100% 97% 99%  Weight: 125 lb (56.7 kg)     Height: 5\' 6"  (1.676 m)       Start Time: 1010 hrs. End Time: 1018 hrs.  Imaging Guidance (Spinal):          Type of Imaging Technique: Fluoroscopy Guidance (Spinal) Indication(s): Assistance in needle guidance and placement for  procedures requiring needle placement in or near specific anatomical locations not easily accessible without such assistance. Exposure Time: Please see nurses notes. Contrast: Before injecting any contrast, we confirmed that the patient did not have an allergy to iodine, shellfish, or radiological contrast. Once satisfactory needle placement was completed at the desired level, radiological contrast was injected. Contrast injected under live fluoroscopy. No contrast complications. See chart for type and volume of contrast used. Fluoroscopic Guidance: I was personally present during the use of fluoroscopy. "Tunnel Vision Technique" used to obtain the best possible view of the target area. Parallax error corrected before commencing the procedure. "Direction-depth-direction" technique used to introduce the needle under continuous pulsed fluoroscopy. Once target was reached, antero-posterior, oblique, and lateral fluoroscopic projection used confirm needle placement in all planes. Images permanently stored in EMR. Interpretation: I personally interpreted the imaging intraoperatively. Adequate needle placement confirmed in multiple planes. Appropriate spread of contrast into desired area was observed. No evidence of afferent or efferent intravascular uptake. No intrathecal or subarachnoid spread observed. Permanent images saved into the patient's record.  Antibiotic Prophylaxis:   Anti-infectives (From admission, onward)    None      Indication(s): None identified  Post-operative Assessment:  Post-procedure Vital Signs:  Pulse/HCG Rate: 75  Temp: (!) 97.3 F (36.3 C) Resp: 15 BP: (!) 158/88 SpO2: 99 %  EBL: None  Complications: No immediate post-treatment complications observed by team, or reported by patient.  Note: The patient tolerated the entire procedure well. A repeat set of vitals were taken after the procedure and the patient was kept under observation following institutional policy,  for this type of procedure. Post-procedural neurological assessment was performed, showing return to baseline, prior to discharge. The patient was provided with post-procedure discharge instructions, including a section on how to identify potential problems. Should any problems arise concerning this procedure, the patient was given instructions to immediately contact us, at any time, without hesitation. In any case, we plan to contact the patient by telephone for a follow-up status report regarding this interventional procedure.  Comments:  No additional relevant information.  Plan of Care (POC)  Orders:  Orders Placed This Encounter  Procedures   MR THORACIC SPINE WO CONTRAST    Patient presents with axial pain with possible radicular component. Please assist [place] in identifying specific level(s) and laterality of any additional findings such as: 1. Facet (Zygapophyseal) joint DJD (Hypertrophy, space narrowing, subchondral sclerosis, and/or osteophyte formation) 2. DDD and/or IVDD (Loss of disc height, desiccation, gas patterns, osteophytes, endplate sclerosis, or "Black disc disease") 3. Pars defects 4. Spondylolisthesis, spondylosis, and/or spondyloarthropathies (include Degree/Grade of displacement in mm) (stability) 5. Vertebral body Fractures (acute/chronic) (state percentage of collapse) 6. Demineralization (osteopenia/osteoporotic) 7. Bone pathology 8. Foraminal narrowing  9. Surgical changes 10. Central, Lateral Recess, and/or Foraminal Stenosis (include AP diameter of stenosis in mm) 11. Surgical changes (hardware type, status, and presence of fibrosis) 12. Modic Type Changes (MRI only) 13. IVDD (Disc bulge, protrusion, herniation, extrusion) (Level, laterality, extent)    Standing Status:   Future    Standing Expiration Date:   06/06/2023    Scheduling Instructions:     Please make sure that the patient understands that this needs to be done as soon as possible. Never have  the patient do the imaging "just before the next appointment". Inform patient that having the imaging done within the [facility] will expedite the availability of the results and will provide      imaging availability to the requesting physician. In addition inform the patient that the imaging order has an expiration date and will not be renewed if not done within the active period.    Order Specific Question:   What is the patient's sedation requirement?    Answer:   No Sedation    Order Specific Question:   Does the patient have a pacemaker or implanted devices?    Answer:   No    Order Specific Question:   Preferred imaging location?    Answer:   [facility] (table limit-350lbs)    Order Specific Question:   Call Results- Best Contact Number?    Answer:   [phone] [facility])    Order Specific Question:   Radiology Contrast Protocol - do NOT remove file path    Answer:   [email or web address]   Ambulatory referral to Psychology    Referral Priority:   Routine    Referral Type:   Psychiatric    Referral Reason:   Specialty Services Required    Requested Specialty:   Psychology    Number of Visits Requested:   1     Medications ordered for procedure: Meds ordered this encounter  Medications   lidocaine (XYLOCAINE) 2 % (with pres) injection 400 mg   dexamethasone (DECADRON) injection 10 mg   dexamethasone (DECADRON) injection 10 mg   ropivacaine (PF) 2 mg/mL (0.2%) (NAROPIN) injection 9 mL   sodium chloride flush (NS) 0.9 % injection 2 mL   iohexol (OMNIPAQUE) 180 MG/ML injection 10 mL    Must be Myelogram-compatible. If not available, you may substitute with a water-soluble, non-ionic, hypoallergenic, myelogram-compatible radiological contrast medium.   Medications administered: We administered lidocaine, dexamethasone, dexamethasone, ropivacaine (PF) 2 mg/mL (0.2%), sodium chloride flush, and iohexol.  See the medical record for  exact dosing, route, and time of administration.  Follow-up plan:   Return for keep scheduled appt .       Left C-ESI C7/T1 , L-ESI L2-3, [place] 03/04/23,    Recent Visits Date Type Provider Dept  04/09/23 Office Visit Edward Jolly, MD [facility]  03/04/23 Procedure visit Nathanyel Defenbaugh,  Roselie Skinner, MD [facility]  02/17/23 Office Visit Edward Jolly, MD [facility]  Showing recent visits within past 90 days and meeting all other requirements Today's Visits Date Type Provider Dept  05/06/23 Procedure visit Edward Jolly, MD [facility]  Showing today's visits and meeting all other requirements Future Appointments Date Type Provider Dept  06/08/23 Appointment Edward Jolly, MD [facility]  Showing future appointments within next 90 days and meeting all other requirements  Disposition: Discharge home  Discharge (Date  Time): 05/06/2023;   hrs.   Primary Care Physician: Doreene Nest, NP Location: [facility] Note by: Edward Jolly, MD (TTS technology used. I apologize for any typographical errors that were not detected and corrected.) Date: 05/06/2023; Time: 11:11 AM  Disclaimer:  Medicine is not an Visual merchandiser. The only guarantee in medicine is that nothing is guaranteed. It is important to note that the decision to proceed with this intervention was based on the information collected from the patient. The Data and conclusions were drawn from the patient's questionnaire, the interview, and the physical examination. Because the information was provided in large part by the patient, it cannot be guaranteed that it has not been purposely or unconsciously manipulated. Every effort has been made to obtain as much relevant data as possible for this evaluation. It is important to note that the conclusions that lead to this procedure are derived in large part from the available data. Always take into account that the  treatment will also be dependent on availability of resources and existing treatment guidelines, considered by other Pain Management Practitioners as being common knowledge and practice, at the time of the intervention. For Medico-Legal purposes, it is also important to point out that variation in procedural techniques and pharmacological choices are the acceptable norm. The indications, contraindications, technique, and results of the above procedure should only be interpreted and judged by a Board-Certified Interventional Pain Specialist with extensive familiarity and expertise in the same exact procedure and technique.

## 2023-05-06 NOTE — Patient Instructions (Signed)

## 2023-05-07 ENCOUNTER — Telehealth: Payer: Self-pay

## 2023-05-07 ENCOUNTER — Other Ambulatory Visit: Payer: Self-pay | Admitting: Primary Care

## 2023-05-07 DIAGNOSIS — E039 Hypothyroidism, unspecified: Secondary | ICD-10-CM

## 2023-05-07 NOTE — Telephone Encounter (Signed)
Please call patient:  Please verify that she is taking levothyroxine 100 mcg tablets, 1 full pill, 5 days weekly and 50 mcg tablets, 1/2 pill, 2 days weekly.  Received refill request from pharmacy.  We will recheck her thyroid levels next month when I see her.

## 2023-05-07 NOTE — Telephone Encounter (Signed)
Post procedure follow up.  LM 

## 2023-05-08 NOTE — Telephone Encounter (Signed)
Unable to reach patient. Left voicemail to return call to our office.   

## 2023-05-11 NOTE — Telephone Encounter (Signed)
Called and spoke with patient, she states she is doing 1 full pill, tablet every day of the week.

## 2023-05-11 NOTE — Telephone Encounter (Signed)
She is supposed to be taking levothyroxine 100 mcg 5 days weekly and 50 mcg 2 days weekly.   At this point, I recommend she come for a lab only appointment ASAP to have her levels repeated.

## 2023-05-12 ENCOUNTER — Ambulatory Visit
Admission: RE | Admit: 2023-05-12 | Discharge: 2023-05-12 | Disposition: A | Discharge status: Home / Self Care | Payer: Medicare PPO | Source: Ambulatory Visit | Attending: Student in an Organized Health Care Education/Training Program | Admitting: Student in an Organized Health Care Education/Training Program

## 2023-05-12 DIAGNOSIS — M5125 Other intervertebral disc displacement, thoracolumbar region: Secondary | ICD-10-CM | POA: Diagnosis not present

## 2023-05-12 DIAGNOSIS — M4804 Spinal stenosis, thoracic region: Secondary | ICD-10-CM | POA: Diagnosis not present

## 2023-05-12 DIAGNOSIS — N281 Cyst of kidney, acquired: Secondary | ICD-10-CM | POA: Diagnosis not present

## 2023-05-12 DIAGNOSIS — M5134 Other intervertebral disc degeneration, thoracic region: Secondary | ICD-10-CM | POA: Diagnosis not present

## 2023-05-12 NOTE — Telephone Encounter (Signed)
Called patient and reviewed all information. Patient verbalized understanding.  Scheduled lab appt for 05/13/23.  Will call if any further questions.

## 2023-05-13 ENCOUNTER — Other Ambulatory Visit: Payer: Medicare PPO

## 2023-05-13 ENCOUNTER — Other Ambulatory Visit (INDEPENDENT_AMBULATORY_CARE_PROVIDER_SITE_OTHER): Payer: Medicare PPO

## 2023-05-13 DIAGNOSIS — E039 Hypothyroidism, unspecified: Secondary | ICD-10-CM

## 2023-05-13 LAB — TSH: TSH: 5.85 u[IU]/mL — ABNORMAL HIGH (ref 0.35–5.50)

## 2023-05-18 ENCOUNTER — Ambulatory Visit: Payer: Self-pay

## 2023-05-18 NOTE — Patient Outreach (Signed)
Care Coordination   Follow Up Visit Note   05/18/2023 Name: ALICYN SUON MRN: [id number] DOB: [birth year]  ATALEE CALIP is a 77 y.o. year old female who sees Doreene Nest, NP for primary care. I spoke with  [name] by phone today.  What matters to the patients health and wellness today?  Patient reports having follow up visit at pain management clinic.  She reports receiving epidural infections.  She reports pain level today is 3.  Patient states her blood sugars are doing good.  Patient states she is unable to states range of blood sugar at this time because she is not at home.  Patient request return call on another day to complete assessment with RNCM.     Goals Addressed             This Visit's Progress    Management and education regarding health conditions       Interventions Today    Flowsheet Row Most Recent Value  Chronic Disease   Chronic disease during today's visit Diabetes, Congestive Heart Failure (CHF), Other  [chronic neck/ shoulder pain]  General Interventions   General Interventions Discussed/Reviewed General Interventions Reviewed  [evaluation of current treatment plan for mentioned health conditions and patients adherence to plan as established by provider.  Assessed for DM, HF, and pain level.]  Pharmacy Interventions   Pharmacy Dicussed/Reviewed Pharmacy Topics Reviewed              SDOH assessments and interventions completed:  No     Care Coordination Interventions:  Yes, provided   Follow up plan: Follow up call scheduled for 06/02/23    Encounter Outcome:  Patient Visit Completed [name] RN,BSN,CCM [facility] Care Coordination [phone] direct line

## 2023-05-28 ENCOUNTER — Other Ambulatory Visit: Payer: Medicare PPO

## 2023-06-02 ENCOUNTER — Ambulatory Visit: Payer: Self-pay

## 2023-06-02 NOTE — Patient Outreach (Signed)
  Care Coordination   06/02/2023 Name: Sarah Payne MRN: [id number] DOB: May 24, 1946   Care Coordination Outreach Attempts:  Successful contact made with patient.  Patient states she does not feel like talking today because she doesn't feel well.  RNCM offered to assist patient.  Patient declined and requested call back on another day.    Follow Up Plan:  Additional outreach attempts will be made to offer the patient care coordination information and services.   Encounter Outcome:  Patient Request to Call Back   Care Coordination Interventions:  No, not indicated    [name] RN,BSN,CCM [facility]  [facility], [facility] coordinator / Case Manager Phone: [phone]

## 2023-06-03 ENCOUNTER — Encounter: Payer: Self-pay | Admitting: Primary Care

## 2023-06-03 ENCOUNTER — Ambulatory Visit: Payer: Medicare PPO | Admitting: Primary Care

## 2023-06-03 VITALS — BP 174/88 | HR 88 | Temp 97.8°F | Ht 66.0 in | Wt 127.0 lb

## 2023-06-03 DIAGNOSIS — E1151 Type 2 diabetes mellitus with diabetic peripheral angiopathy without gangrene: Secondary | ICD-10-CM

## 2023-06-03 DIAGNOSIS — E039 Hypothyroidism, unspecified: Secondary | ICD-10-CM

## 2023-06-03 DIAGNOSIS — E11649 Type 2 diabetes mellitus with hypoglycemia without coma: Secondary | ICD-10-CM | POA: Diagnosis not present

## 2023-06-03 DIAGNOSIS — Z794 Long term (current) use of insulin: Secondary | ICD-10-CM

## 2023-06-03 LAB — POCT GLYCOSYLATED HEMOGLOBIN (HGB A1C): Hemoglobin A1C: 8 % — AB (ref 4.0–5.6)

## 2023-06-03 LAB — TSH: TSH: 0.32 u[IU]/mL — ABNORMAL LOW (ref 0.35–5.50)

## 2023-06-03 MED ORDER — SITAGLIPTIN PHOSPHATE 100 MG PO TABS: 100.0000 mg | ORAL_TABLET | Freq: Every day | ORAL | 1 refills | Status: DC

## 2023-06-03 NOTE — Patient Instructions (Signed)
Stop by the lab prior to leaving today. I will notify you of your results once received.   Start sitagliptan (Januvia) 100 mg daily for diabetes.  Continue to monitor your blood sugars, try to wean off the insulin.  Please schedule a follow up visit for 3 months.  It was a pleasure to see you today!

## 2023-06-03 NOTE — Assessment & Plan Note (Signed)
She is taking levothyroxine correctly.  Unsure why such the fluctuation between August and October with TSH levels, especially while on the same dose.  Repeat TSH pending. Continue levothyroxine 100 mcg daily

## 2023-06-03 NOTE — Assessment & Plan Note (Signed)
Uncontrolled with A1C of 8.0 today.  Because of her continued episodes of hypoglycemia, would like to eventually discontinue insulin.  Start Januvia 100 mg daily. Work to wean off Novolog.  Foot exam today.  Follow up in 3 months.

## 2023-06-03 NOTE — Progress Notes (Signed)
Subjective:    Patient ID: Sarah Payne, female    DOB: [birth year], 77 y.o.   MRN: [id number]  HPI  Sarah Payne is a very pleasant 77 y.o. female with significant medical history including CAD, CHF, hypertension, cardiomyopathy, COPD, sleep apnea, chronic bronchitis, uncontrolled type 2 diabetes, hypothyroidism, CKD, chronic pain syndrome, insomnia, anxiety depression who presents today for follow-up of diabetes and hypothyroidism.  1) Type 2 Diabetes:  Current medications include: Januvia 100 mg daily, NovoLog 6 to 16 units 3 times daily with meals.  She never started Januvia. She continues with Novolog, 5 units in the AM, 5 units at lunch, and 5 units at dinner.   She is checking her blood glucose continuously and is getting readings of  AM fasting: 120-130 Before lunch: 110 Bedtime: 110-120  She continues to experience hypoglycemia around 3 pm, drops in the 40-60 range.   Last A1C: 8.1 in August 2024, 8.0 today Last Eye Exam: Up-to-date Last Foot Exam: Due Pneumonia Vaccination: 2020 Urine Microalbumin: Up-to-date Statin: Atorvastatin  Dietary changes since last visit: She continues to have little appetite. She eats cheese dogs for lunch, meat and 2 veggies for dinner.   She drink 1 small can of Pepsi daily  Exercise: None  2) Hypothyroidism: Currently managed on levothyroxine 100 mcg tablets daily.  TSH from August 2024 with level of 0.07 so it was recommended she reduce her levothyroxine to 100 mcg 5 days weekly and 50 mcg 2 days weekly.  Later, it was found that she did not do this and had been continuing 100 mcg daily.  TSH from October 2024 with level 5.85.  She is taking levothyroxine every morning on an empty stomach with water only.   No food or other medications for 30 minutes.   No heartburn medication, iron pills, calcium, vitamin D, or magnesium pills within four hours of taking levothyroxine.      Review of Systems  Respiratory:  Negative for  shortness of breath.   Cardiovascular:  Negative for chest pain.  Gastrointestinal:  Negative for constipation and diarrhea.  Neurological:  Positive for numbness.         Past Medical History:  Diagnosis Date   Acute pain of left shoulder 01/17/2019   Acute renal failure superimposed on stage 3b chronic kidney disease (HCC) 09/03/2022   Aortic atherosclerosis (HCC)    Bilateral cataracts 01/2015   a.) s/p extraction   CHF (congestive heart failure) (HCC) 09/16/2013   a.) TTE 09/16/2013: EF 55-60%; mod inferior HK, triv TR; G1DD. b.) TTE 01/02/2016: EF 25-30%; mid-apicalateroseptal, lat, inf, inferoseptal, apical akinesis; triv TR; G1DD. c. TTE 04/16/2016: EF 65%; G1DD. d.) TTE 05/02/2020: EF 50-55%; G2DD. e.) TTE 04/11/2021: EF 55%; G1DD.   Chronic low back pain    Clostridioides difficile infection 2015   COPD (chronic obstructive pulmonary disease) (HCC)    Coronary artery disease    a.) PCI 02/06/2011: 100% mLCx - DES x 3. b.) PCI 09/17/2011: 95% dRCA - DES x 1. c.) PCI 10/02/2011: 75% pLCX - DES x 1. d.) PCI 08/19/2012: DES x 2 p-mRCA.  e.) LHC 01/01/16: Takotsubo event 01/01/2016 with patent stents   Depression    Diverticulosis    Frequent PVCs    a. Noted in hospital 12/2015.   GERD (gastroesophageal reflux disease)    History of heart artery stent    a.) TOTAL of 7 stents: a.) 02/06/2011 - overlapping 2.5x12 mm Xience V, 2.5x8 mm Xience V, 2.25x8 mm Xience  [employer] to [employer]. b.) 09/17/2011 - 2.5x23 Xience V dRCA. c.) 10/02/2011 - 2.5x15 mm Xience V pLCx. d.) 08/09/2012 - overlapping 3.0x69mm mRCA and 3.5x28mm pRCA   Hyperlipidemia    Hypertension    Hypothyroidism    Impingement syndrome of left shoulder    LBBB (left bundle branch block)    Left flank pain 10/28/2022   Long term current use of antithrombotics/antiplatelets    a.) clopidogrel   Marijuana abuse    Myocardial infarction [facility])    a.) multiple MIs;  5 per her report   OSA (obstructive sleep apnea)    a.) mild;  does not require nocturnal PAP therapy   Pancreatitis 09/19/2022   Paroxysmal SVT (supraventricular tachycardia) (HCC) 05/08/2021   a.)  Zio patch 05/08/2021: 3 distinct SVT runs; fastest 5 beats at a rate of 146 bpm; longest 7 beats at rate of 134 bpm   T2DM (type 2 diabetes mellitus) (HCC)    Takotsubo cardiomyopathy    a. 12/2015 - nephew committed suicide 1 week prior, sister died the morning of presentation - initially called a STEMI; cath with patent stents. LVEF 25-30%.   Tendonitis of left rotator cuff    Tobacco abuse    Vaginal burning 03/22/2019   Vascular dementia     Social History   Socioeconomic History   Marital status: Married    Spouse name: Not on file   Number of children: Not on file   Years of education: Not on file   Highest education level: Not on file  Occupational History   Not on file  Tobacco Use   Smoking status: Every Day    Current packs/day: 1.00    Average packs/day: 1 pack/day for 45.0 years (45.0 ttl pk-yrs)    Types: Cigarettes   Smokeless tobacco: Never   Tobacco comments:    Has cut back, trying to quit.   Vaping Use   Vaping status: Never Used  Substance and Sexual Activity   Alcohol use: No    Alcohol/week: 0.0 standard drinks of alcohol   Drug use: Yes    Types: Marijuana    Comment: last noc   Sexual activity: Not on file  Other Topics Concern   Not on file  Social History Narrative   Lives at home with her husband in [place].  Previously used marijuana - quit.      Regular exercise: no/ pain from a frozen rotator cuff   Caffeine use: coffee daily and pepsi      Does not have a living will.   Daughters and husband know her wishes- would desire CPR but not prolonged life support if futile   Social Determinants of Health   Financial Resource Strain: Low Risk  (08/06/2022)   Overall Financial Resource Strain (CARDIA)    Difficulty of Paying Living Expenses: Not hard at all  Food Insecurity: No Food Insecurity (11/04/2022)    Hunger Vital Sign    Worried About Running Out of Food in the Last Year: Never true    Ran Out of Food in the Last Year: Never true  Transportation Needs: No Transportation Needs (11/04/2022)   PRAPARE - Administrator, Civil Service (Medical): No    Lack of Transportation (Non-Medical): No  Physical Activity: Inactive (08/06/2022)   Exercise Vital Sign    Days of Exercise per Week: 0 days    Minutes of Exercise per Session: 0 min  Stress: No Stress Concern Present (08/06/2022)   [employer] of  Occupational Health - Occupational Stress Questionnaire    Feeling of Stress : Only a little  Social Connections: Socially Isolated (08/06/2022)   Social Connection and Isolation Panel [NHANES]    Frequency of Communication with Friends and Family: Never    Frequency of Social Gatherings with Friends and Family: Never    Attends Religious Services: Never    Database administrator or Organizations: No    Attends Banker Meetings: Never    Marital Status: Married  Catering manager Violence: Not At Risk (09/04/2022)   Humiliation, Afraid, Rape, and Kick questionnaire    Fear of Current or Ex-Partner: No    Emotionally Abused: No    Physically Abused: No    Sexually Abused: No    Past Surgical History:  Procedure Laterality Date   ABDOMINAL AORTOGRAM W/LOWER EXTREMITY N/A 09/12/2020   Procedure: ABDOMINAL AORTOGRAM W/LOWER EXTREMITY;  Surgeon: Iran Ouch, MD;  Location: [facility] INVASIVE CV LAB;  Service: Cardiovascular;  Laterality: N/A;   ABDOMINAL HYSTERECTOMY     APPENDECTOMY     BACK SURGERY     CARDIAC CATHETERIZATION N/A 01/01/2016   Procedure: Left Heart Cath and Coronary Angiography;  Surgeon: Corky Crafts, MD;  Location: [facility] INVASIVE CV LAB;  Service: Cardiovascular;  Laterality: N/A;   CATARACT EXTRACTION W/PHACO Left 01/30/2015   Procedure: CATARACT EXTRACTION PHACO AND INTRAOCULAR LENS PLACEMENT (IOC);  Surgeon: Galen Manila, MD;   Location: [facility] ORS;  Service: Ophthalmology;  Laterality: Left;  [place] 00:47    CATARACT EXTRACTION W/PHACO Right 02/13/2015   Procedure: CATARACT EXTRACTION PHACO AND INTRAOCULAR LENS PLACEMENT (IOC);  Surgeon: Galen Manila, MD;  Location: [facility] ORS;  Service: Ophthalmology;  Laterality: Right;  cassette lot # [id number] H [place]  00:29.9 AP  20.7 CDE  6.20   COLONOSCOPY N/A 11/02/2014   Procedure: COLONOSCOPY;  Surgeon: Louis Meckel, MD;  Location: [facility] ENDOSCOPY;  Service: Endoscopy;  Laterality: N/A;   CORONARY ANGIOPLASTY WITH STENT PLACEMENT Left 02/06/2011   Procedure: CORONARY ANGIOPLASTY WITH STENT PLACEMENT; Location: [facility]; Surgeon: Rudean Hitt, MD   CORONARY ANGIOPLASTY WITH STENT PLACEMENT Left 09/17/2011   Procedure: CORONARY ANGIOPLASTY WITH STENT PLACEMENT; Location: [facility]; Surgeon: Rudean Hitt, MD   CORONARY ANGIOPLASTY WITH STENT PLACEMENT Left 10/02/2011   Procedure: CORONARY ANGIOPLASTY WITH STENT PLACEMENT; Location: [facility]; Surgeon: Rudean Hitt, MD   CORONARY ANGIOPLASTY WITH STENT PLACEMENT Left 08/19/2012   Procedure: CORONARY ANGIOPLASTY WITH STENT PLACEMENT; Location: [facility]; Surgeon: Lorine Bears, MD   ESOPHAGOGASTRODUODENOSCOPY (EGD) WITH PROPOFOL N/A 09/21/2018   Procedure: ESOPHAGOGASTRODUODENOSCOPY (EGD) WITH PROPOFOL;  Surgeon: Wyline Mood, MD;  Location: [facility] ENDOSCOPY;  Service: Gastroenterology;  Laterality: N/A;   ESOPHAGOGASTRODUODENOSCOPY (EGD) WITH PROPOFOL N/A 10/06/2022   Procedure: ESOPHAGOGASTRODUODENOSCOPY (EGD) WITH PROPOFOL;  Surgeon: Wyline Mood, MD;  Location: [facility] ENDOSCOPY;  Service: Gastroenterology;  Laterality: N/A;   EYE SURGERY     LEFT HEART CATH AND CORONARY ANGIOGRAPHY Left 08/02/2014   Procedure: LEFT HEART CATH AND CORONARY ANGIOGRAPHY; Location: [facility]; Surgeon: Bryan Lemma, MD   PERIPHERAL VASCULAR ATHERECTOMY Left 09/12/2020   Procedure: PERIPHERAL VASCULAR ATHERECTOMY;  Surgeon: Iran Ouch, MD;  [facility] INVASIVE  CV LAB;  Service: Cardiovascular;  Laterality: Left;   PERIPHERAL VASCULAR BALLOON ANGIOPLASTY  09/12/2020   Procedure: PERIPHERAL VASCULAR BALLOON ANGIOPLASTY;  Surgeon: Iran Ouch, MD;  Location: [facility] INVASIVE CV LAB;  Service: Cardiovascular;;   REVERSE SHOULDER ARTHROPLASTY Left 10/01/2021   Procedure: REVERSE SHOULDER ARTHROPLASTY WITH BICEPS TENODESIS.;  Surgeon: Christena Flake, MD;  Location: [facility] ORS;  Service: Orthopedics;  Laterality: Left;   SHOULDER SURGERY Left 2017    Family History  Problem Relation Age of Onset   Heart attack Mother        First MI @ 55 - Died @ 74   Heart disease Mother    Heart disease Father        Died @ 33   Throat cancer Brother    Liver cancer Brother    Colon cancer Sister     No Known Allergies  Current Outpatient Medications on File Prior to Visit  Medication Sig Dispense Refill   albuterol (VENTOLIN HFA) 108 (90 Base) MCG/ACT inhaler Inhale 2 puffs into the lungs every 6 (six) hours as needed for wheezing or shortness of breath. 8 g 0   atorvastatin (LIPITOR) 80 MG tablet Take 80 mg by mouth daily.     carvedilol (COREG) 3.125 MG tablet Take 1 tablet (3.125 mg total) by mouth 2 (two) times daily. 180 tablet 3   clobetasol cream (TEMOVATE) 0.05 % Apply 1 Application topically 2 (two) times daily as needed. For vaginal itching. 30 g 0   clopidogrel (PLAVIX) 75 MG tablet Take 1 tablet (75 mg total) by mouth daily. 90 tablet 3   Continuous Blood Gluc Receiver (FREESTYLE LIBRE 2 READER) DEVI Use with sensor to check blood sugars 6 times daily. 1 each 0   Continuous Glucose Sensor (FREESTYLE LIBRE 2 SENSOR) MISC USE ONE SENSOR EVERY 14 DAYS TO CHECK BLOOD SUGAR SIX TIMES PER DAY 6 each 1   DULoxetine (CYMBALTA) 60 MG capsule Take 60 mg by mouth daily.     ezetimibe (ZETIA) 10 MG tablet Take 1 tablet (10 mg total) by mouth daily. For cholesterol. 90 tablet 3   famotidine (PEPCID) 20 MG tablet Take 1 tablet (20 mg total) by mouth at bedtime.  For nausea and acid 90 tablet 0   furosemide (LASIX) 20 MG tablet Take 1 tablet (20 mg total) by mouth as needed (As needed for weight gain greater than 3 pounds overnight or increased shortness of breath.). Take one tab only on Monday, Wednesday, & Fridays 90 tablet 3   gabapentin (NEURONTIN) 300 MG capsule TAKE 1 CAPSULE BY MOUTH TWICE DAILY FOR PAIN 180 capsule 3   Glucagon (GVOKE HYPOPEN 2-PACK) 1 MG/0.2ML SOAJ Inject 1 mg (1 pen) as needed for severe low blood sugar (sustained glucose less than 55 despite oral glucose treatments). May repeat in 15 minutes as needed. 0.4 mL 1   insulin aspart (NOVOLOG FLEXPEN) 100 UNIT/ML FlexPen Inject 6 to 16 units three times daily with meals per sliding scale for diabetes 45 mL 0   Insulin Pen Needle (INSUPEN PEN NEEDLES) 32G X 4 MM MISC Use to inject insulin 4 times daily. 250 each 1   isosorbide mononitrate (IMDUR) 30 MG 24 hr tablet Take 30 mg by mouth daily.     levothyroxine (SYNTHROID) 100 MCG tablet TAKE 1 TABLET BY MOUTH IN THE MORNING ON  AN  EMPTY  STOMACH  WITH  WATER  ONLY.  NO  FOOD  OR  OTHER  MEDICATIONS  FOR  30  MINUTES 90 tablet 0   mirtazapine (REMERON) 15 MG tablet Take 1 tablet (15 mg total) by mouth at bedtime. For sleep and depression 90 tablet 3   omeprazole (PRILOSEC) 40 MG capsule Take 1 capsule (40 mg total) by mouth daily. 90 capsule 3   ondansetron (ZOFRAN) 4 MG tablet Take 1 tablet (4 mg  total) by mouth every 6 (six) hours as needed for nausea. 15 tablet 0   ondansetron (ZOFRAN-ODT) 4 MG disintegrating tablet Take 1 tablet (4 mg total) by mouth every 8 (eight) hours as needed for nausea or vomiting. 20 tablet 0   potassium chloride (KLOR-CON M) 10 MEQ tablet Only take on days you take your lasix. Take 1 tablet (10 mEq total) by mouth for 1 dose on those days you take your lasix 20mg  pill. 90 tablet 3   umeclidinium-vilanterol (ANORO ELLIPTA) 62.5-25 MCG/ACT AEPB Inhale 1 puff into the lungs daily. 60 each 5   No current  facility-administered medications on file prior to visit.    BP (!) 174/88   Pulse 88   Temp 97.8 F (36.6 C) (Temporal)   Ht 5\' 6"  (1.676 m)   Wt 127 lb (57.6 kg)   SpO2 98%   BMI 20.50 kg/m  Objective:   Physical Exam Cardiovascular:     Rate and Rhythm: Normal rate and regular rhythm.  Pulmonary:     Effort: Pulmonary effort is normal.     Breath sounds: Normal breath sounds.  Musculoskeletal:     Cervical back: Neck supple.  Skin:    General: Skin is warm and dry.  Neurological:     Mental Status: She is alert and oriented to person, place, and time.  Psychiatric:        Mood and Affect: Mood normal.           Assessment & Plan:  Diabetes mellitus type 2 with peripheral artery disease (HCC) -     POCT glycosylated hemoglobin (Hb A1C) -     SITagliptin Phosphate; Take 1 tablet (100 mg total) by mouth daily. for diabetes.  Dispense: 90 tablet; Refill: 1  Uncontrolled type 2 diabetes mellitus with hypoglycemia, with long-term current use of insulin (HCC) Assessment & Plan: Uncontrolled with A1C of 8.0 today.  Because of her continued episodes of hypoglycemia, would like to eventually discontinue insulin.  Start Januvia 100 mg daily. Work to wean off Novolog.  Foot exam today.  Follow up in 3 months.   Hypothyroidism, unspecified type Assessment & Plan: She is taking levothyroxine correctly.  Unsure why such the fluctuation between August and October with TSH levels, especially while on the same dose.  Repeat TSH pending. Continue levothyroxine 100 mcg daily  Orders: -     TSH        Doreene Nest, NP

## 2023-06-04 ENCOUNTER — Other Ambulatory Visit: Payer: Self-pay | Admitting: Primary Care

## 2023-06-04 DIAGNOSIS — E1151 Type 2 diabetes mellitus with diabetic peripheral angiopathy without gangrene: Secondary | ICD-10-CM

## 2023-06-04 MED ORDER — SITAGLIPTIN PHOSPHATE 100 MG PO TABS: 100.0000 mg | ORAL_TABLET | Freq: Every day | ORAL | 1 refills | Status: DC

## 2023-06-08 ENCOUNTER — Ambulatory Visit
Payer: Medicare PPO | Attending: Student in an Organized Health Care Education/Training Program | Admitting: Student in an Organized Health Care Education/Training Program

## 2023-06-08 ENCOUNTER — Encounter: Payer: Self-pay | Admitting: Student in an Organized Health Care Education/Training Program

## 2023-06-08 VITALS — BP 134/77 | HR 73 | Temp 97.5°F | Ht 66.0 in | Wt 123.0 lb

## 2023-06-08 DIAGNOSIS — M961 Postlaminectomy syndrome, not elsewhere classified: Secondary | ICD-10-CM | POA: Insufficient documentation

## 2023-06-08 DIAGNOSIS — M48061 Spinal stenosis, lumbar region without neurogenic claudication: Secondary | ICD-10-CM | POA: Insufficient documentation

## 2023-06-08 DIAGNOSIS — M51369 Other intervertebral disc degeneration, lumbar region without mention of lumbar back pain or lower extremity pain: Secondary | ICD-10-CM | POA: Diagnosis not present

## 2023-06-08 DIAGNOSIS — G894 Chronic pain syndrome: Secondary | ICD-10-CM | POA: Insufficient documentation

## 2023-06-08 DIAGNOSIS — M5416 Radiculopathy, lumbar region: Secondary | ICD-10-CM | POA: Diagnosis not present

## 2023-06-08 DIAGNOSIS — Z981 Arthrodesis status: Secondary | ICD-10-CM | POA: Diagnosis not present

## 2023-06-08 DIAGNOSIS — G8929 Other chronic pain: Secondary | ICD-10-CM | POA: Diagnosis not present

## 2023-06-08 DIAGNOSIS — E1140 Type 2 diabetes mellitus with diabetic neuropathy, unspecified: Secondary | ICD-10-CM | POA: Diagnosis not present

## 2023-06-08 MED ORDER — CAPSAICIN-CLEANSING GEL 8 % EX KIT
4.0000 | PACK | Freq: Once | CUTANEOUS | Status: AC
  Administered 2023-06-08: 4 via TOPICAL
  Filled 2023-06-08: qty 4

## 2023-06-08 NOTE — Patient Instructions (Addendum)
Pain Management Discharge Instructions  General Discharge Instructions :  If you need to reach your doctor call: Monday-Friday 8:00 am - 4:00 pm at [phone] or toll free [phone].  After clinic hours [phone] to have operator reach doctor.  Bring all of your medication bottles to all your appointments in the pain clinic.  To cancel or reschedule your appointment with Pain Management please remember to call 24 hours in advance to avoid a fee.  Refer to the educational materials which you have been given on: General Risks, I had my Procedure. Discharge Instructions, Post Sedation.  Post Procedure Instructions:  The drugs you were given will stay in your system until tomorrow, so for the next 24 hours you should not drive, make any legal decisions or drink any alcoholic beverages.  You may eat anything you prefer, but it is better to start with liquids then soups and crackers, and gradually work up to solid foods.  Please notify your doctor immediately if you have any unusual bleeding, trouble breathing or pain that is not related to your normal pain.  Depending on the type of procedure that was done, some parts of your body may feel week and/or numb.  This usually clears up by tonight or the next day.  Walk with the use of an assistive device or accompanied by an adult for the 24 hours.  You may use ice on the affected area for the first 24 hours.  Put ice in a Ziploc bag and cover with a towel and place against area 15 minutes on 15 minutes off.  You may switch to heat after 24 hours.    ______________________________________________________________________    Procedure instructions  Stop blood-thinners  Do not eat or drink fluids (other than water) for 6 hours before your procedure  No water for 2 hours before your procedure  Take your blood pressure medicine with a sip of water  Arrive 30 minutes before your appointment  If sedation is planned, bring suitable  driver. Pennie Banter, Benedetto Goad, & public transportation are NOT APPROVED)  Carefully read the "Preparing for your procedure" detailed instructions  If you have questions call us at [phone]  ______________________________________________________________________

## 2023-06-08 NOTE — Progress Notes (Signed)
PROVIDER NOTE: Interpretation of information contained herein should be left to medically-trained personnel. Specific patient instructions are provided elsewhere under "Patient Instructions" section of medical record. This document was created in part using STT-dictation technology, any transcriptional errors that may result from this process are unintentional.  Patient: Sarah Payne Type: Established DOB: 04/14/1946 MRN: [id number] PCP: Doreene Nest, NP  Service: Procedure DOS: 06/08/2023 Setting: Ambulatory Location: Ambulatory outpatient facility Delivery: Face-to-face Provider: Edward Jolly, MD Specialty: Interventional Pain Management Specialty designation: 09 Location: Outpatient facility Ref. Prov.: Edward Jolly, MD       Interventional Therapy   Interventional Treatment:           Type: Qutenza Neurolysis #2  Laterality:  Bilateral Area treated: Feet Imaging Guidance: None Anesthesia/analgesia/anxiolysis/sedation: None required Medication (Right): Qutenza (capsaicin 8%) topical system Medication (Left): Qutenza (capsaicin 8%) topical system Date: 06/08/2023 Performed by: Edward Jolly, MD Rationale (medical necessity): procedure needed and proper for the treatment of Sarah Payne's medical symptoms and needs. Indication: Painful diabetic peripheral neuralgia (DPN) (ICD-10-CM:E11.40) severe enough to impact quality of life or function.     Position / Prep / Materials:  Position: Supine  Materials: Qutenza Kit  H&P (Pre-op Assessment):  Sarah Payne is a 77 y.o. (year old), female patient, seen today for interventional treatment. She  has a past surgical history that includes Back surgery; Appendectomy; Abdominal hysterectomy; Colonoscopy (N/A, 11/02/2014); Cataract extraction w/PHACO (Left, 01/30/2015); Coronary angioplasty with stent (Left, 02/06/2011); Coronary angioplasty with stent (Left, 09/17/2011); Cataract extraction w/PHACO (Right, 02/13/2015); Cardiac  catheterization (N/A, 01/01/2016); Esophagogastroduodenoscopy (egd) with propofol (N/A, 09/21/2018); Shoulder surgery (Left, 2017); ABDOMINAL AORTOGRAM W/LOWER EXTREMITY (N/A, 09/12/2020); PERIPHERAL VASCULAR ATHERECTOMY (Left, 09/12/2020); PERIPHERAL VASCULAR BALLOON ANGIOPLASTY (09/12/2020); Coronary angioplasty with stent (Left, 10/02/2011); Coronary angioplasty with stent (Left, 08/19/2012); LEFT HEART CATH AND CORONARY ANGIOGRAPHY (Left, 08/02/2014); Reverse shoulder arthroplasty (Left, 10/01/2021); Eye surgery; and Esophagogastroduodenoscopy (egd) with propofol (N/A, 10/06/2022). Sarah Payne has a current medication list which includes the following prescription(s): albuterol, atorvastatin, carvedilol, clobetasol cream, clopidogrel, freestyle libre 2 reader, freestyle libre 2 sensor, duloxetine, ezetimibe, famotidine, furosemide, gabapentin, gvoke hypopen 2-pack, novolog flexpen, insupen pen needles, isosorbide mononitrate, levothyroxine, mirtazapine, omeprazole, ondansetron, ondansetron, potassium chloride, sitagliptin, and umeclidinium-vilanterol. Her primarily concern today is the Back Pain (lower)  Initial Vital Signs:  Pulse/HCG Rate: 73  Temp: (!) 97.5 F (36.4 C) Resp:   BP: 134/77 SpO2: 100 %  BMI: Estimated body mass index is 19.85 kg/m as calculated from the following:   Height as of this encounter: 5\' 6"  (1.676 m).   Weight as of this encounter: 123 lb (55.8 kg).  Risk Assessment: Allergies: Reviewed. She has No Known Allergies.  Allergy Precautions: None required Coagulopathies: Reviewed. None identified.  Blood-thinner therapy: None at this time Active Infection(s): Reviewed. None identified. Sarah Payne is afebrile  Site Confirmation: Sarah Payne was asked to confirm the procedure and laterality before marking the site Procedure checklist: Completed Consent: Before the procedure and under the influence of no sedative(s), amnesic(s), or anxiolytics, the patient was informed of the  treatment options, risks and possible complications. To fulfill our ethical and legal obligations, as recommended by the American Medical Association's Code of Ethics, I have informed the patient of my clinical impression; the nature and purpose of the treatment or procedure; the risks, benefits, and possible complications of the intervention; the alternatives, including doing nothing; the risk(s) and benefit(s) of the alternative treatment(s) or procedure(s); and the risk(s) and benefit(s) of doing nothing. The patient was provided information  about the general risks and possible complications associated with the procedure. These may include, but are not limited to: failure to achieve desired goals, infection, bleeding, organ or nerve damage, allergic reactions, paralysis, and death. In addition, the patient was informed of those risks and complications associated to the procedure, such as failure to decrease pain; infection; bleeding; organ or nerve damage with subsequent damage to sensory, motor, and/or autonomic systems, resulting in permanent pain, numbness, and/or weakness of one or several areas of the body; allergic reactions; (i.e.: anaphylactic reaction); and/or death. Furthermore, the patient was informed of those risks and complications associated with the medications. These include, but are not limited to: allergic reactions (i.e.: anaphylactic or anaphylactoid reaction(s)); adrenal axis suppression; blood sugar elevation that in diabetics may result in ketoacidosis or comma; water retention that in patients with history of congestive heart failure may result in shortness of breath, pulmonary edema, and decompensation with resultant heart failure; weight gain; swelling or edema; medication-induced neural toxicity; particulate matter embolism and blood vessel occlusion with resultant organ, and/or nervous system infarction; and/or aseptic necrosis of one or more joints. Finally, the patient was  informed that Medicine is not an exact science; therefore, there is also the possibility of unforeseen or unpredictable risks and/or possible complications that may result in a catastrophic outcome. The patient indicated having understood very clearly. We have given the patient no guarantees and we have made no promises. Enough time was given to the patient to ask questions, all of which were answered to the patient's satisfaction. Ms. Grassel has indicated that she wanted to continue with the procedure. Attestation: I, the ordering provider, attest that I have discussed with the patient the benefits, risks, side-effects, alternatives, likelihood of achieving goals, and potential problems during recovery for the procedure that I have provided informed consent. Date  Time: 06/08/2023  8:41 AM  Pre-Procedure Preparation:  Monitoring: As per clinic protocol. Respiration, ETCO2, SpO2, BP, heart rate and rhythm monitor placed and checked for adequate function Safety Precautions: Patient was assessed for positional comfort and pressure points before starting the procedure. Time-out: I initiated and conducted the "Time-out" before starting the procedure, as per protocol. The patient was asked to participate by confirming the accuracy of the "Time Out" information. Verification of the correct person, site, and procedure were performed and confirmed by me, the nursing staff, and the patient. "Time-out" conducted as per Joint Commission's Universal Protocol (UP.01.01.01). Time: 0915 Start Time: 0920 hrs.  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  Description of the Procedure: Protocol guidelines were followed. The patient was  assisted into a comfortable position.  Informed consent was obtained in the patient monitored in the usual manner.  All questions were answered prior to the procedure.  They Qutenza patches were applied to the affected area and then covered with the wrap.  The Patient was kept under observation until the treatment was completed.  The patches were removed and the treated area was inspected.  Vitals:   06/08/23 0846  BP: 134/77  Pulse: 73  Temp: (!) 97.5 F (36.4 C)  TempSrc: Temporal  SpO2: 100%  Weight: 123 lb (55.8 kg)  Height: 5\' 6"  (1.676 m)     End Time: 1004 hrs.  Type of  Imaging Technique: None used Indication(s): N/A Exposure Time: No patient exposure Contrast: None used. Fluoroscopic Guidance: N/A Ultrasound Guidance: N/A Interpretation: N/A  Post-operative Assessment:  Post-procedure Vital Signs:  Pulse/HCG Rate: 73  Temp: (!) 97.5 F (36.4 C) Resp:   BP: 134/77 SpO2: 100 %  EBL: None  Complications: No immediate post-treatment complications observed by team, or reported by patient.  Note: The patient tolerated the entire procedure well. A repeat set of vitals were taken after the procedure and the patient was kept under observation following institutional policy, for this type of procedure. Post-procedural neurological assessment was performed, showing return to baseline, prior to discharge. The patient was provided with post-procedure discharge instructions, including a section on how to identify potential problems. Should any problems arise concerning this procedure, the patient was given instructions to immediately contact us, at any time, without hesitation. In any case, we plan to contact the patient by telephone for a follow-up status report regarding this interventional procedure.  Comments:  No additional relevant information.  Plan of Care (POC)   Patient is struggling with radiating bilateral leg pain.  She has a history of L3-L5 lumbar spinal fusion.   She states that she has difficulty walking longer than 2 to 3 minutes and notes weakness in her legs.  She is status post L2-L3 ESI on 05/06/2023 which did provide her with relief until approximately 2 weeks ago when she had family over and had to clean her house afterwards and states that she aggravated her back when she was sweeping and vacuuming.  Will plan for repeating L2-L3 lumbar epidural steroid injection.  Instructed to stop Plavix for 7 days prior to procedure.  We have also discussed spinal cord stimulator trial in the past for failed back surgical syndrome, chronic lumbar radicular pain.  Risk and benefits reviewed and patient would like to proceed with that.  She will need to get a psych eval done.  Orders:  Orders Placed This Encounter  Procedures   Lumbar Epidural Injection    Standing Status:   Future    Standing Expiration Date:   09/08/2023    Scheduling Instructions:     Procedure: Interlaminar Lumbar Epidural Steroid injection (LESI)            Laterality: L2/3     Sedation: Patient's choice.     Timeframe: ASAA    Order Specific Question:   Where will this procedure be performed?    Answer:   [facility]   [place] [facility]    Contact medical implant company representative to notify them of the scheduled case and to make sure they will be available to provide required equipment.    Standing Status:   Future    Standing Expiration Date:   09/08/2023    Scheduling Instructions:     Side: Bilateral     Level: Lumbar     Device: [employer]     Sedation: With sedation     Timeframe: As soon as Designer, industrial/product Question:   Where will this procedure be performed?    Answer:   [facility]    Medications ordered for procedure: Meds ordered this encounter  Medications   capsaicin topical system 8 % patch 4 patch   Medications administered: We administered capsaicin topical system.  See the medical record for exact dosing, route, and time  of administration.  Follow-up plan:   Return in about 1 week (around 06/15/2023) for L2/3 ESI #2,  in clinic NS (stop Plavix for 7 days).       Recent Visits Date Type Provider Dept  05/06/23 Procedure visit Edward Jolly, MD [facility]  04/09/23 Office Visit Edward Jolly, MD [facility]  Showing recent visits within past 90 days and meeting all other requirements Today's Visits Date Type Provider Dept  06/08/23 Procedure visit Edward Jolly, MD [facility]  Showing today's visits and meeting all other requirements Future Appointments No visits were found meeting these conditions. Showing future appointments within next 90 days and meeting all other requirements  Disposition: Discharge home  Discharge (Date  Time): 06/08/2023; 1010 hrs.   Primary Care Physician: Doreene Nest, NP Location: [facility] Note by: Edward Jolly, MD (TTS technology used. I apologize for any typographical errors that were not detected and corrected.) Date: 06/08/2023; Time: 11:47 AM  Disclaimer:  Medicine is not an Visual merchandiser. The only guarantee in medicine is that nothing is guaranteed. It is important to note that the decision to proceed with this intervention was based on the information collected from the patient. The Data and conclusions were drawn from the patient's questionnaire, the interview, and the physical examination. Because the information was provided in large part by the patient, it cannot be guaranteed that it has not been purposely or unconsciously manipulated. Every effort has been made to obtain as much relevant data as possible for this evaluation. It is important to note that the conclusions that lead to this procedure are derived in large part from the available data. Always take into account that the treatment will also be dependent on availability of resources and existing treatment guidelines, considered by other  Pain Management Practitioners as being common knowledge and practice, at the time of the intervention. For Medico-Legal purposes, it is also important to point out that variation in procedural techniques and pharmacological choices are the acceptable norm. The indications, contraindications, technique, and results of the above procedure should only be interpreted and judged by a Board-Certified Interventional Pain Specialist with extensive familiarity and expertise in the same exact procedure and technique.

## 2023-06-08 NOTE — Progress Notes (Signed)
Safety precautions to be maintained throughout the outpatient stay will include: orient to surroundings, keep bed in low position, maintain call bell within reach at all times, provide assistance with transfer out of bed and ambulation.  

## 2023-06-17 ENCOUNTER — Encounter: Payer: Self-pay | Admitting: Student in an Organized Health Care Education/Training Program

## 2023-06-17 ENCOUNTER — Ambulatory Visit
Admission: RE | Admit: 2023-06-17 | Discharge: 2023-06-17 | Disposition: A | Discharge status: Home / Self Care | Payer: Medicare PPO | Source: Ambulatory Visit | Attending: Student in an Organized Health Care Education/Training Program | Admitting: Student in an Organized Health Care Education/Training Program

## 2023-06-17 ENCOUNTER — Ambulatory Visit
Payer: Medicare PPO | Attending: Student in an Organized Health Care Education/Training Program | Admitting: Student in an Organized Health Care Education/Training Program

## 2023-06-17 DIAGNOSIS — Z981 Arthrodesis status: Secondary | ICD-10-CM | POA: Diagnosis not present

## 2023-06-17 DIAGNOSIS — M48061 Spinal stenosis, lumbar region without neurogenic claudication: Secondary | ICD-10-CM | POA: Insufficient documentation

## 2023-06-17 DIAGNOSIS — M5416 Radiculopathy, lumbar region: Secondary | ICD-10-CM | POA: Insufficient documentation

## 2023-06-17 DIAGNOSIS — G8929 Other chronic pain: Secondary | ICD-10-CM | POA: Diagnosis not present

## 2023-06-17 DIAGNOSIS — M51369 Other intervertebral disc degeneration, lumbar region without mention of lumbar back pain or lower extremity pain: Secondary | ICD-10-CM | POA: Insufficient documentation

## 2023-06-17 MED ORDER — IOHEXOL 180 MG/ML  SOLN
10.0000 mL | Freq: Once | INTRAMUSCULAR | Status: AC
  Administered 2023-06-17: 10 mL via EPIDURAL
  Filled 2023-06-17: qty 20

## 2023-06-17 MED ORDER — DEXAMETHASONE SODIUM PHOSPHATE 10 MG/ML IJ SOLN
10.0000 mg | Freq: Once | INTRAMUSCULAR | Status: AC
  Administered 2023-06-17: 10 mg
  Filled 2023-06-17: qty 1

## 2023-06-17 MED ORDER — ROPIVACAINE HCL 2 MG/ML IJ SOLN
2.0000 mL | Freq: Once | INTRAMUSCULAR | Status: AC
  Administered 2023-06-17: 20 mL via EPIDURAL
  Filled 2023-06-17: qty 20

## 2023-06-17 MED ORDER — SODIUM CHLORIDE 0.9% FLUSH
2.0000 mL | Freq: Once | INTRAVENOUS | Status: AC
  Administered 2023-06-17: 10 mL

## 2023-06-17 MED ORDER — LIDOCAINE HCL 2 % IJ SOLN
20.0000 mL | Freq: Once | INTRAMUSCULAR | Status: AC
  Administered 2023-06-17: 400 mg
  Filled 2023-06-17: qty 20

## 2023-06-17 NOTE — Progress Notes (Signed)
Denies symptoms of post procedure headache.

## 2023-06-17 NOTE — Patient Instructions (Addendum)
______________________________________________________________________    Post-Procedure Discharge Instructions  Instructions: Apply ice:  Purpose: This will minimize any swelling and discomfort after procedure.  When: Day of procedure, as soon as you get home. How: Fill a plastic sandwich bag with crushed ice. Cover it with a small towel and apply to injection site. How long: (15 min on, 15 min off) Apply for 15 minutes then remove x 15 minutes.  Repeat sequence on day of procedure, until you go to bed. Apply heat:  Purpose: To treat any soreness and discomfort from the procedure. When: Starting the next day after the procedure. How: Apply heat to procedure site starting the day following the procedure. How long: May continue to repeat daily, until discomfort goes away. Food intake: Start with clear liquids (like water) and advance to regular food, as tolerated.  Physical activities: Keep activities to a minimum for the first 8 hours after the procedure. After that, then as tolerated. Driving: If you have received any sedation, be responsible and do not drive. You are not allowed to drive for 24 hours after having sedation. Blood thinner: (Applies only to those taking blood thinners) You may restart your blood thinner 6 hours after your procedure. Insulin: (Applies only to Diabetic patients taking insulin) As soon as you can eat, you may resume your normal dosing schedule. Infection prevention: Keep procedure site clean and dry. Shower daily and clean area with soap and water. Post-procedure Pain Diary: Extremely important that this be done correctly and accurately. Recorded information will be used to determine the next step in treatment. For the purpose of accuracy, follow these rules: Evaluate only the area treated. Do not report or include pain from an untreated area. For the purpose of this evaluation, ignore all other areas of pain, except for the treated area. After your procedure,  avoid taking a long nap and attempting to complete the pain diary after you wake up. Instead, set your alarm clock to go off every hour, on the hour, for the initial 8 hours after the procedure. Document the duration of the numbing medicine, and the relief you are getting from it. Do not go to sleep and attempt to complete it later. It will not be accurate. If you received sedation, it is likely that you were given a medication that may cause amnesia. Because of this, completing the diary at a later time may cause the information to be inaccurate. This information is needed to plan your care. Follow-up appointment: Keep your post-procedure follow-up evaluation appointment after the procedure (usually 2 weeks for most procedures, 6 weeks for radiofrequencies). DO NOT FORGET to bring you pain diary with you.   Expect: (What should I expect to see with my procedure?) From numbing medicine (AKA: Local Anesthetics): Numbness or decrease in pain. You may also experience some weakness, which if present, could last for the duration of the local anesthetic. Onset: Full effect within 15 minutes of injected. Duration: It will depend on the type of local anesthetic used. On the average, 1 to 8 hours.  From steroids (Applies only if steroids were used): Decrease in swelling or inflammation. Once inflammation is improved, relief of the pain will follow. Onset of benefits: Depends on the amount of swelling present. The more swelling, the longer it will take for the benefits to be seen. In some cases, up to 10 days. Duration: Steroids will stay in the system x 2 weeks. Duration of benefits will depend on multiple posibilities including persistent irritating factors. Side-effects: If  present, they may typically last 2 weeks (the duration of the steroids). Frequent: Cramps (if they occur, drink Gatorade and take over-the-counter Magnesium 450-500 mg once to twice a day); water retention with temporary weight gain;  increases in blood sugar; decreased immune system response; increased appetite. Occasional: Facial flushing (red, warm cheeks); mood swings; menstrual changes. Uncommon: Long-term decrease or suppression of natural hormones; bone thinning. (These are more common with higher doses or more frequent use. This is why we prefer that our patients avoid having any injection therapies in other practices.)  Very Rare: Severe mood changes; psychosis; aseptic necrosis. From procedure: Some discomfort is to be expected once the numbing medicine wears off. This should be minimal if ice and heat are applied as instructed.  Call if: (When should I call?) You experience numbness and weakness that gets worse with time, as opposed to wearing off. New onset bowel or bladder incontinence. (Applies only to procedures done in the spine)  Emergency Numbers: Durning business hours (Monday - Thursday, 8:00 AM - 4:00 PM) (Friday, 9:00 AM - 12:00 Noon): [phone] After hours: [phone] NOTE: If you are having a problem and are unable connect with, or to talk to a provider, then go to your nearest urgent care or emergency department. If the problem is serious and urgent, please call 911. ______________________________________________________________________     Sarah Payne may restart your Plavix tomorrow.

## 2023-06-17 NOTE — Progress Notes (Signed)
Safety precautions to be maintained throughout the outpatient stay will include: orient to surroundings, keep bed in low position, maintain call bell within reach at all times, provide assistance with transfer out of bed and ambulation.  

## 2023-06-17 NOTE — Progress Notes (Signed)
PROVIDER NOTE: Interpretation of information contained herein should be left to medically-trained personnel. Specific patient instructions are provided elsewhere under "Patient Instructions" section of medical record. This document was created in part using STT-dictation technology, any transcriptional errors that may result from this process are unintentional.  Patient: Sarah Payne Type: Established DOB: 12-Feb-1946 MRN: [id number] PCP: Doreene Nest, NP  Service: Procedure DOS: 06/17/2023 Setting: Ambulatory Location: Ambulatory outpatient facility Delivery: Face-to-face Provider: Edward Jolly, MD Specialty: Interventional Pain Management Specialty designation: 09 Location: Outpatient facility Ref. Prov.: Edward Jolly, MD       Interventional Therapy   Procedure: Lumbar epidural steroid injection (LESI) (interlaminar) #2    Laterality: Right   Level:  L2-3 Level.  Imaging: Fluoroscopic guidance         Anesthesia: Local anesthesia (1-2% Lidocaine) DOS: 06/17/2023  Performed by: Edward Jolly, MD  Purpose: Diagnostic/Therapeutic Indications: Lumbar radicular pain of intraspinal etiology of more than 4 weeks that has failed to respond to conservative therapy and is severe enough to impact quality of life or function.  Patient stopped Plavix 7 days prior  NAS-11 Pain score:   Pre-procedure: 8 /10   Post-procedure: 0-No pain/10      Position / Prep / Materials:  Position: Prone w/ head of the table raised (slight reverse trendelenburg) to facilitate breathing.  Prep solution: DuraPrep (Iodine Povacrylex [0.7% available iodine] and Isopropyl Alcohol, 74% w/w) Prep Area: Entire Posterior Lumbar Region from lower scapular tip down to mid buttocks area and from flank to flank. Materials:  Tray: Epidural tray Needle(s):  Type: Epidural needle (Tuohy) Gauge (G):  22 Length: Regular (3.5-in) Qty: 1   H&P (Pre-op Assessment):  Sarah Payne is a 77 y.o. (year old), female  patient, seen today for interventional treatment. She  has a past surgical history that includes Back surgery; Appendectomy; Abdominal hysterectomy; Colonoscopy (N/A, 11/02/2014); Cataract extraction w/PHACO (Left, 01/30/2015); Coronary angioplasty with stent (Left, 02/06/2011); Coronary angioplasty with stent (Left, 09/17/2011); Cataract extraction w/PHACO (Right, 02/13/2015); Cardiac catheterization (N/A, 01/01/2016); Esophagogastroduodenoscopy (egd) with propofol (N/A, 09/21/2018); Shoulder surgery (Left, 2017); ABDOMINAL AORTOGRAM W/LOWER EXTREMITY (N/A, 09/12/2020); PERIPHERAL VASCULAR ATHERECTOMY (Left, 09/12/2020); PERIPHERAL VASCULAR BALLOON ANGIOPLASTY (09/12/2020); Coronary angioplasty with stent (Left, 10/02/2011); Coronary angioplasty with stent (Left, 08/19/2012); LEFT HEART CATH AND CORONARY ANGIOGRAPHY (Left, 08/02/2014); Reverse shoulder arthroplasty (Left, 10/01/2021); Eye surgery; and Esophagogastroduodenoscopy (egd) with propofol (N/A, 10/06/2022). Sarah Payne has a current medication list which includes the following prescription(s): albuterol, atorvastatin, carvedilol, clobetasol cream, freestyle libre 2 reader, freestyle libre 2 sensor, duloxetine, ezetimibe, famotidine, furosemide, gabapentin, novolog flexpen, insupen pen needles, isosorbide mononitrate, levothyroxine, mirtazapine, omeprazole, ondansetron, ondansetron, potassium chloride, sitagliptin, umeclidinium-vilanterol, clopidogrel, and gvoke hypopen 2-pack. Her primarily concern today is the Back Pain  Initial Vital Signs:  Pulse/HCG Rate: 88  Temp: (!) 97 F (36.1 C) Resp: 17 BP: (!) 151/90 SpO2: 97 %  BMI: Estimated body mass index is 19.85 kg/m as calculated from the following:   Height as of this encounter: 5\' 6"  (1.676 m).   Weight as of this encounter: 123 lb (55.8 kg).  Risk Assessment: Allergies: Reviewed. She has No Known Allergies.  Allergy Precautions: None required Coagulopathies: Reviewed. None identified.   Blood-thinner therapy: None at this time Active Infection(s): Reviewed. None identified. Sarah Payne is afebrile  Site Confirmation: Sarah Payne was asked to confirm the procedure and laterality before marking the site Procedure checklist: Completed Consent: Before the procedure and under the influence of no sedative(s), amnesic(s), or anxiolytics, the patient was informed of  the treatment options, risks and possible complications. To fulfill our ethical and legal obligations, as recommended by the American Medical Association's Code of Ethics, I have informed the patient of my clinical impression; the nature and purpose of the treatment or procedure; the risks, benefits, and possible complications of the intervention; the alternatives, including doing nothing; the risk(s) and benefit(s) of the alternative treatment(s) or procedure(s); and the risk(s) and benefit(s) of doing nothing. The patient was provided information about the general risks and possible complications associated with the procedure. These may include, but are not limited to: failure to achieve desired goals, infection, bleeding, organ or nerve damage, allergic reactions, paralysis, and death. In addition, the patient was informed of those risks and complications associated to Spine-related procedures, such as failure to decrease pain; infection (i.e.: Meningitis, epidural or intraspinal abscess); bleeding (i.e.: epidural hematoma, subarachnoid hemorrhage, or any other type of intraspinal or peri-dural bleeding); organ or nerve damage (i.e.: Any type of peripheral nerve, nerve root, or spinal cord injury) with subsequent damage to sensory, motor, and/or autonomic systems, resulting in permanent pain, numbness, and/or weakness of one or several areas of the body; allergic reactions; (i.e.: anaphylactic reaction); and/or death. Furthermore, the patient was informed of those risks and complications associated with the medications. These include,  but are not limited to: allergic reactions (i.e.: anaphylactic or anaphylactoid reaction(s)); adrenal axis suppression; blood sugar elevation that in diabetics may result in ketoacidosis or comma; water retention that in patients with history of congestive heart failure may result in shortness of breath, pulmonary edema, and decompensation with resultant heart failure; weight gain; swelling or edema; medication-induced neural toxicity; particulate matter embolism and blood vessel occlusion with resultant organ, and/or nervous system infarction; and/or aseptic necrosis of one or more joints. Finally, the patient was informed that Medicine is not an exact science; therefore, there is also the possibility of unforeseen or unpredictable risks and/or possible complications that may result in a catastrophic outcome. The patient indicated having understood very clearly. We have given the patient no guarantees and we have made no promises. Enough time was given to the patient to ask questions, all of which were answered to the patient's satisfaction. Ms. Houge has indicated that she wanted to continue with the procedure. Attestation: I, the ordering provider, attest that I have discussed with the patient the benefits, risks, side-effects, alternatives, likelihood of achieving goals, and potential problems during recovery for the procedure that I have provided informed consent. Date  Time: 06/17/2023 12:55 PM   Pre-Procedure Preparation:  Monitoring: As per clinic protocol. Respiration, ETCO2, SpO2, BP, heart rate and rhythm monitor placed and checked for adequate function Safety Precautions: Patient was assessed for positional comfort and pressure points before starting the procedure. Time-out: I initiated and conducted the "Time-out" before starting the procedure, as per protocol. The patient was asked to participate by confirming the accuracy of the "Time Out" information. Verification of the correct person,  site, and procedure were performed and confirmed by me, the nursing staff, and the patient. "Time-out" conducted as per Joint Commission's Universal Protocol (UP.01.01.01). Time: 1338 Start Time: 1338 hrs.  Description/Narrative of Procedure:          Target: Epidural space via interlaminar opening, initially targeting the lower laminar border of the superior vertebral body. Region: Lumbar Approach: Percutaneous paravertebral  Rationale (medical necessity): procedure needed and proper for the diagnosis and/or treatment of the patient's medical symptoms and needs. Procedural Technique Safety Precautions: Aspiration looking for blood return was conducted  prior to all injections. At no point did we inject any substances, as a needle was being advanced. No attempts were made at seeking any paresthesias. Safe injection practices and needle disposal techniques used. Medications properly checked for expiration dates. SDV (single dose vial) medications used. Description of the Procedure: Protocol guidelines were followed. The procedure needle was introduced through the skin, ipsilateral to the reported pain, and advanced to the target area. Bone was contacted and the needle walked caudad, until the lamina was cleared. The epidural space was identified using "loss-of-resistance technique" with 2-3 ml of PF-NaCl (0.9% NSS), in a 5cc LOR glass syringe.  5 cc solution made of 2 cc of preservative-free saline, 2 cc of 0.2% ropivacaine, 1 cc of Decadron 10 mg/cc.   Vitals:   06/17/23 1315 06/17/23 1339 06/17/23 1341  BP: (!) 151/90 (!) 150/91 134/84  Pulse: 88 91 91  Resp: 17 20 12   Temp: (!) 97 F (36.1 C)    TempSrc: Temporal    SpO2: 97% 93% 97%  Weight: 123 lb (55.8 kg)    Height: 5\' 6"  (1.676 m)      Start Time: 1338 hrs. End Time: 1341 hrs.  Imaging Guidance (Spinal):          Type of Imaging Technique: Fluoroscopy Guidance (Spinal) Indication(s): Assistance in needle guidance and placement  for procedures requiring needle placement in or near specific anatomical locations not easily accessible without such assistance. Exposure Time: Please see nurses notes. Contrast: Before injecting any contrast, we confirmed that the patient did not have an allergy to iodine, shellfish, or radiological contrast. Once satisfactory needle placement was completed at the desired level, radiological contrast was injected. Contrast injected under live fluoroscopy. No contrast complications. See chart for type and volume of contrast used. Fluoroscopic Guidance: I was personally present during the use of fluoroscopy. "Tunnel Vision Technique" used to obtain the best possible view of the target area. Parallax error corrected before commencing the procedure. "Direction-depth-direction" technique used to introduce the needle under continuous pulsed fluoroscopy. Once target was reached, antero-posterior, oblique, and lateral fluoroscopic projection used confirm needle placement in all planes. Images permanently stored in EMR. Interpretation: I personally interpreted the imaging intraoperatively. Adequate needle placement confirmed in multiple planes. Appropriate spread of contrast into desired area was observed. No evidence of afferent or efferent intravascular uptake. No intrathecal or subarachnoid spread observed. Permanent images saved into the patient's record.  Antibiotic Prophylaxis:   Anti-infectives (From admission, onward)    None      Indication(s): None identified  Post-operative Assessment:  Post-procedure Vital Signs:  Pulse/HCG Rate: 91  Temp: (!) 97 F (36.1 C) Resp: 12 BP: 134/84 SpO2: 97 %  EBL: None  Complications: No immediate post-treatment complications observed by team, or reported by patient.  Note: The patient tolerated the entire procedure well. A repeat set of vitals were taken after the procedure and the patient was kept under observation following institutional policy, for  this type of procedure. Post-procedural neurological assessment was performed, showing return to baseline, prior to discharge. The patient was provided with post-procedure discharge instructions, including a section on how to identify potential problems. Should any problems arise concerning this procedure, the patient was given instructions to immediately contact us, at any time, without hesitation. In any case, we plan to contact the patient by telephone for a follow-up status report regarding this interventional procedure.  Comments:  No additional relevant information.  Plan of Care (POC)  Orders:  Orders Placed This  Encounter  Procedures   DG PAIN CLINIC C-ARM 1-60 MIN NO REPORT    Intraoperative interpretation by procedural physician at [facility].    Standing Status:   Standing    Number of Occurrences:   1    Order Specific Question:   Reason for exam:    Answer:   Assistance in needle guidance and placement for procedures requiring needle placement in or near specific anatomical locations not easily accessible without such assistance.   Ok to restart Plavix tomorrow  Medications ordered for procedure: Meds ordered this encounter  Medications   iohexol (OMNIPAQUE) 180 MG/ML injection 10 mL    Must be Myelogram-compatible. If not available, you may substitute with a water-soluble, non-ionic, hypoallergenic, myelogram-compatible radiological contrast medium.   lidocaine (XYLOCAINE) 2 % (with pres) injection 400 mg   ropivacaine (PF) 2 mg/mL (0.2%) (NAROPIN) injection 2 mL   sodium chloride flush (NS) 0.9 % injection 2 mL   dexamethasone (DECADRON) injection 10 mg   Medications administered: We administered iohexol, lidocaine, ropivacaine (PF) 2 mg/mL (0.2%), sodium chloride flush, and dexamethasone.  See the medical record for exact dosing, route, and time of administration.  Follow-up plan:   Return in about 5 weeks (around 07/21/2023) for PPE, F2F.       Left  C-ESI, L-ESI, [place] 03/04/23. L2/3 ESI 06/17/23    Recent Visits Date Type Provider Dept  06/08/23 Procedure visit Edward Jolly, MD [facility]  05/06/23 Procedure visit Edward Jolly, MD [facility]  04/09/23 Office Visit Edward Jolly, MD [facility]  Showing recent visits within past 90 days and meeting all other requirements Today's Visits Date Type Provider Dept  06/17/23 Procedure visit Edward Jolly, MD [facility]  Showing today's visits and meeting all other requirements Future Appointments Date Type Provider Dept  07/29/23 Appointment Edward Jolly, MD [facility]  Showing future appointments within next 90 days and meeting all other requirements  Disposition: Discharge home  Discharge (Date  Time): 06/17/2023; 1400 hrs.   Primary Care Physician: Doreene Nest, NP Location: [facility] Note by: Edward Jolly, MD (TTS technology used. I apologize for any typographical errors that were not detected and corrected.) Date: 06/17/2023; Time: 2:42 PM  Disclaimer:  Medicine is not an Visual merchandiser. The only guarantee in medicine is that nothing is guaranteed. It is important to note that the decision to proceed with this intervention was based on the information collected from the patient. The Data and conclusions were drawn from the patient's questionnaire, the interview, and the physical examination. Because the information was provided in large part by the patient, it cannot be guaranteed that it has not been purposely or unconsciously manipulated. Every effort has been made to obtain as much relevant data as possible for this evaluation. It is important to note that the conclusions that lead to this procedure are derived in large part from the available data. Always take into account that the treatment will also be dependent on availability of resources and existing treatment guidelines, considered  by other Pain Management Practitioners as being common knowledge and practice, at the time of the intervention. For Medico-Legal purposes, it is also important to point out that variation in procedural techniques and pharmacological choices are the acceptable norm. The indications, contraindications, technique, and results of the above procedure should only be interpreted and judged by a Board-Certified Interventional Pain Specialist with extensive familiarity and expertise in the same exact procedure and  technique.

## 2023-06-19 ENCOUNTER — Telehealth: Payer: Self-pay

## 2023-06-19 NOTE — Telephone Encounter (Signed)
Post procedure follow up.  Patient states she is doing ok.  

## 2023-06-26 DIAGNOSIS — F3342 Major depressive disorder, recurrent, in full remission: Secondary | ICD-10-CM | POA: Diagnosis not present

## 2023-07-23 ENCOUNTER — Other Ambulatory Visit: Payer: Self-pay | Admitting: Primary Care

## 2023-07-23 DIAGNOSIS — E1151 Type 2 diabetes mellitus with diabetic peripheral angiopathy without gangrene: Secondary | ICD-10-CM

## 2023-07-23 DIAGNOSIS — E039 Hypothyroidism, unspecified: Secondary | ICD-10-CM

## 2023-07-23 MED ORDER — LEVOTHYROXINE SODIUM 100 MCG PO TABS: ORAL_TABLET | ORAL | 1 refills | Status: DC

## 2023-07-23 MED ORDER — FREESTYLE LIBRE 2 READER DEVI: 0 refills | Status: DC

## 2023-07-23 MED ORDER — FREESTYLE LIBRE 2 SENSOR MISC: 1 refills | Status: DC

## 2023-07-23 NOTE — Telephone Encounter (Signed)
 Copied from CRM [id number]. Topic: Clinical - Medication Refill >> Jul 23, 2023 12:36 PM Adaysia C wrote: Most Recent Primary Care Visit:  Provider: CLARK, KATHERINE K  Department: [facility]  Visit Type: OFFICE VISIT  Date: 06/03/2023  Medication: levothyroxine  (SYNTHROID ) 100 MCG tablet, Continuous Glucose Sensor (FREESTYLE LIBRE 2 SENSOR) MISC,   Has the patient contacted their pharmacy? No patient contacted pharmacy to initiate RX refill because she is out of town. (Agent: If no, request that the patient contact the pharmacy for the refill. If patient does not wish to contact the pharmacy document the reason why and proceed with request.) (Agent: If yes, when and what did the pharmacy advise?)  Is this the correct pharmacy for this prescription? Yes If no, delete pharmacy and type the correct one.  This is the patient's preferred pharmacy:  Pharmacy at [facility] [phone]- [address] [place], [place] [address] [phone]    Has the prescription been filled recently? No  Is the patient out of the medication? Yes  Has the patient been seen for an appointment in the last year OR does the patient have an upcoming appointment? Yes  Can we respond through MyChart? No  Agent: Please be advised that Rx refills may take up to 3 business days. We ask that you follow-up with your pharmacy.

## 2023-07-29 ENCOUNTER — Ambulatory Visit: Payer: Self-pay

## 2023-07-29 ENCOUNTER — Ambulatory Visit: Payer: Medicare PPO | Admitting: Student in an Organized Health Care Education/Training Program

## 2023-07-29 ENCOUNTER — Telehealth: Payer: Self-pay | Admitting: Primary Care

## 2023-07-29 NOTE — Patient Outreach (Signed)
  Care Coordination   Follow Up Visit Note   07/29/2023 Name: Sarah Payne MRN: [id number] DOB: 1945-11-04  Sarah Payne is a 78 y.o. year old female who sees Gretta Comer MARLA, NP for primary care. I spoke with  [name] by phone today.  What matters to the patients health and wellness today?  Patient reports ongoing back pain. She reports having follow up visit with orthopedic provider in 05/2023 and receiving steroid injection. She states this has help some and now she is at least able to  get around.  Patient reports pain level today is a 6.  Patient states she is scheduled to have a spinal cord stimulator on 08/12/23.  Patient denies increase in HF symptoms. Patient states her blood sugars have gone up recently. She states she is not really sure why. She reports having follow up with diabetic nurse as well. Patient states her blood sugars are ranging from 100-247 over the last month. She states her Hgb A1c is 8.0 which is up from where it previously was. She states she is working on getting it back down to a goal of 7.0.  Patient reports her blood sugars drop everyday around lunch time. She states her provider is aware and has adjusted her Novolog  to 5 units with each meal.  She states she is not taking the Januvia  because it wasn't working.  She reports her low blood sugars have ranged from 45-60's.    Goals Addressed             This Visit's Progress    Management and education regarding health conditions       Interventions Today    Flowsheet Row Most Recent Value  Chronic Disease   Chronic disease during today's visit Diabetes, Congestive Heart Failure (CHF)  General Interventions   General Interventions Discussed/Reviewed General Interventions Reviewed, Doctor Visits, Labs  [evaluation of current treatment plan for  HF, DM, chronic neck/ back/shoulder pain and patients adherence to plan as established by provider.  Assessed for HF symptoms, BS readings and pain level.]  Labs  Hgb A1c every 3 months  [Discussed TSH lab.]  Doctor Visits Discussed/Reviewed Doctor Visits Reviewed  bethann upcoming provider visits.  Advised to keep follow up appointments with providers as recommended.]  Education Interventions   Education Provided Provided Education  [reviewed heart failure symptoms. Reviewed hypoglycemic treatment. Advised to notify provider for ongoing hypoglycemic events/ BS <70 or elevated BS>250.]  Provided Verbal Education On Other  [discussed ongoing pain management.]  Pharmacy Interventions   Pharmacy Dicussed/Reviewed Pharmacy Topics Reviewed  [medications reviewed. Message sent to primary care provider on clarification for levothyroxine  dosage.]              SDOH assessments and interventions completed:  No     Care Coordination Interventions:  Yes, provided   Follow up plan: Follow up call scheduled for 09/11/23 at 11 am    Encounter Outcome:  Patient Visit Completed   Dameon Soltis RN,BSN,CCM [facility]  [facility], [facility] coordinator / Case Manager Phone: [phone]

## 2023-07-29 NOTE — Patient Instructions (Signed)
 Visit Information  Thank you for taking time to visit with me today. Please don't hesitate to contact me if I can be of assistance to you.   Following are the goals we discussed today:   Goals Addressed             This Visit's Progress    Management and education regarding health conditions       Interventions Today    Flowsheet Row Most Recent Value  Chronic Disease   Chronic disease during today's visit Diabetes, Congestive Heart Failure (CHF)  General Interventions   General Interventions Discussed/Reviewed General Interventions Reviewed, Doctor Visits, Labs  [evaluation of current treatment plan for  HF, DM, chronic neck/ back/shoulder pain and patients adherence to plan as established by provider.  Assessed for HF symptoms, BS readings and pain level.]  Labs Hgb A1c every 3 months  [Discussed TSH lab.]  Doctor Visits Discussed/Reviewed Doctor Visits Reviewed  bethann upcoming provider visits.  Advised to keep follow up appointments with providers as recommended.]  Education Interventions   Education Provided Provided Education  [reviewed heart failure symptoms. Reviewed hypoglycemic treatment. Advised to notify provider for ongoing hypoglycemic events/ BS <70 or elevated BS>250.]  Provided Verbal Education On Other  [discussed ongoing pain management.]  Pharmacy Interventions   Pharmacy Dicussed/Reviewed Pharmacy Topics Reviewed  [medications reviewed. Message sent to primary care provider on clarification for levothyroxine  dosage.]              Our next appointment is by telephone on 09/11/23 at 11 am  Please call the care guide team at [phone] if you need to cancel or reschedule your appointment.   If you are experiencing a Mental Health or Behavioral Health Crisis or need someone to talk to, please call the Suicide and Crisis Lifeline: 988 call 1-800-273-TALK (toll free, 24 hour hotline)  Patient verbalizes understanding of instructions and care plan provided  today and agrees to view in MyChart. Active MyChart status and patient understanding of how to access instructions and care plan via MyChart confirmed with patient.     Arvin Seip RN,BSN,CCM [place]  [facility], [facility] coordinator / Case Manager Phone: [phone]

## 2023-07-29 NOTE — Telephone Encounter (Addendum)
 Thanks, Davina!  Kelli, Please call patient:  She is supposed to take 1 pill of levothyroxine  6 days weekly and 1/2 pill of levothyroxine  1 day weekly.  This is written within a result note from 06/03/2023.    ----- Message from Nurse Davina G sent at 07/29/2023  4:24 PM EST ----- Regarding: medication clarification Good afternoon Comer, I spoke with Ms. Sleep today and she has a question regarding how she is taking her levothyroxine .  Your last office visit with her was on 06/03/23.  Ms. Rake stated she's been taking her levothyroxine  100 mcg 1 per day x 6 days then on the 7th day not taking it.  I was unable to verify that for her in your 06/03/23 note. It appears from your note that you want her to continue taking the levothyroxine  daily.  Can you please have your staff call and clarify this with her.    Thank you, Arvin Seip RN,BSN,CCM [facility]  [facility], [facility] coordinator / Case Manager Phone: [phone]

## 2023-07-30 ENCOUNTER — Telehealth: Payer: Self-pay

## 2023-07-30 NOTE — Patient Outreach (Signed)
  Care Coordination   07/30/2023 Name: AILANIE RUTTAN MRN: [id number] DOB: [birth year]   Care Coordination Outreach Attempts:  An unsuccessful telephone outreach was attempted today to offer the patient information about available complex care management services. HIPAA compliant message left with return call phone number.   Follow Up Plan:  Additional outreach attempts will be made to offer the patient complex care management information and services.   Encounter Outcome:  No Answer   Care Coordination Interventions:  No, not indicated    Arvin Seip RN,BSN,CCM [facility]  [facility] coordinator / Case Manager Phone: [phone]

## 2023-07-30 NOTE — Telephone Encounter (Signed)
 Called patient and reviewed all information. Patient verbalized understanding on how to take levothyroxine. Will call if any further questions.

## 2023-08-05 ENCOUNTER — Ambulatory Visit
Admission: EM | Admit: 2023-08-05 | Discharge: 2023-08-05 | Disposition: A | Discharge status: Home / Self Care | Payer: Medicare PPO | Attending: Emergency Medicine | Admitting: Emergency Medicine

## 2023-08-05 ENCOUNTER — Ambulatory Visit (INDEPENDENT_AMBULATORY_CARE_PROVIDER_SITE_OTHER): Payer: Medicare PPO

## 2023-08-05 DIAGNOSIS — Z981 Arthrodesis status: Secondary | ICD-10-CM | POA: Diagnosis not present

## 2023-08-05 DIAGNOSIS — M533 Sacrococcygeal disorders, not elsewhere classified: Secondary | ICD-10-CM

## 2023-08-05 DIAGNOSIS — M47816 Spondylosis without myelopathy or radiculopathy, lumbar region: Secondary | ICD-10-CM | POA: Diagnosis not present

## 2023-08-05 NOTE — ED Provider Notes (Signed)
 Sarah Payne    CSN: [id number] Arrival date & time: 08/05/23  1231      History   Chief Complaint Chief Complaint  Patient presents with   Back Pain    HPI Sarah Payne is a 78 y.o. female.  Patient presents with pain in her "tailbone" after she fell on the ice 5 days ago.  The pain radiates to her lower back.  It is worse with movement and position changes.  She has been taking Tylenol .  She denies numbness, weakness, saddle anesthesia, loss of bowel/bladder control, abdominal pain.  Her medical history includes chronic pain, history of lumbar fusion, lumbar spondylosis, lumbar degenerative disc disease, peripheral polyneuropathy, chronic musculoskeletal pain.   The history is provided by the patient and medical records.    Past Medical History:  Diagnosis Date   Acute pain of left shoulder 01/17/2019   Acute renal failure superimposed on stage 3b chronic kidney disease (HCC) 09/03/2022   Aortic atherosclerosis (HCC)    Bilateral cataracts 01/2015   a.) s/p extraction   CHF (congestive heart failure) (HCC) 09/16/2013   a.) TTE 09/16/2013: EF 55-60%; mod inferior HK, triv TR; G1DD. b.) TTE 01/02/2016: EF 25-30%; mid-apicalateroseptal, lat, inf, inferoseptal, apical akinesis; triv TR; G1DD. c. TTE 04/16/2016: EF 65%; G1DD. d.) TTE 05/02/2020: EF 50-55%; G2DD. e.) TTE 04/11/2021: EF 55%; G1DD.   Chronic low back pain    Clostridioides difficile infection 2015   COPD (chronic obstructive pulmonary disease) (HCC)    Coronary artery disease    a.) PCI 02/06/2011: 100% mLCx - DES x 3. b.) PCI 09/17/2011: 95% dRCA - DES x 1. c.) PCI 10/02/2011: 75% pLCX - DES x 1. d.) PCI 08/19/2012: DES x 2 p-mRCA.  e.) LHC 01/01/16: Takotsubo event 01/01/2016 with patent stents   Depression    Diverticulosis    Frequent PVCs    a. Noted in hospital 12/2015.   GERD (gastroesophageal reflux disease)    History of heart artery stent    a.) TOTAL of 7 stents: a.) 02/06/2011 - overlapping  2.5x12 mm Xience V, 2.5x8 mm Xience V, 2.25x8 mm Xience Nano to mLCx. b.) 09/17/2011 - 2.5x23 Xience V dRCA. c.) 10/02/2011 - 2.5x15 mm Xience V pLCx. d.) 08/09/2012 - overlapping 3.0x73mm mRCA and 3.5x36mm pRCA   Hyperlipidemia    Hypertension    Hypothyroidism    Impingement syndrome of left shoulder    LBBB (left bundle branch block)    Left flank pain 10/28/2022   Long term current use of antithrombotics/antiplatelets    a.) clopidogrel    Marijuana abuse    Myocardial infarction [facility])    a.) multiple MIs;  5 per her report   OSA (obstructive sleep apnea)    a.) mild; does not require nocturnal PAP therapy   Pancreatitis 09/19/2022   Paroxysmal SVT (supraventricular tachycardia) (HCC) 05/08/2021   a.)  Zio patch 05/08/2021: 3 distinct SVT runs; fastest 5 beats at a rate of 146 bpm; longest 7 beats at rate of 134 bpm   T2DM (type 2 diabetes mellitus) (HCC)    Takotsubo cardiomyopathy    a. 12/2015 - nephew committed suicide 1 week prior, sister died the morning of presentation - initially called a STEMI; cath with patent stents. LVEF 25-30%.   Tendonitis of left rotator cuff    Tobacco abuse    Vaginal burning 03/22/2019   Vascular dementia     Patient Active Problem List   Diagnosis Date Noted   Cervical radicular pain  02/17/2023   Chronic radicular lumbar pain 02/17/2023   Chronic painful diabetic neuropathy (HCC) 01/27/2023   Spinal stenosis, lumbar region, with neurogenic claudication 01/27/2023   Loss of weight 10/06/2022   Transaminitis 09/03/2022   Dizziness 10/11/2021   Status post reverse total shoulder replacement, left 10/04/2021   S/p reverse total shoulder arthroplasty 10/01/2021   S/P reverse total shoulder arthroplasty, right 10/01/2021   Sinus bradycardia 10/01/2021   Chronic diastolic CHF (congestive heart failure) (HCC) 10/01/2021   Stage 3b chronic kidney disease (CKD) (HCC) 10/01/2021   Primary osteoarthritis of left shoulder 09/16/2021   Rotator cuff  tendinitis, left 09/16/2021   Preventative health care 02/15/2021   Abdominal bloating 03/22/2019   Chronic musculoskeletal pain 11/15/2018   Atherosclerosis of native coronary artery of native heart with stable angina pectoris (HCC) 05/17/2018   Chronic neck pain 02/10/2018   Chronic pain of right upper extremity 02/10/2018   Cervical spondylosis 02/10/2018   Cervicalgia 01/19/2018   Right shoulder pain 01/19/2018   Chronic pain syndrome 01/19/2018   Lumbar degenerative disc disease 09/29/2017   Lumbar spondylosis with myelopathy 09/29/2017   Chronic bilateral back pain 05/19/2017   Insomnia 01/12/2017   Cannabis use disorder, mild, abuse 09/03/2016   Cognitive changes 09/03/2016   Takotsubo cardiomyopathy 01/02/2016   Frequent PVCs 01/02/2016   HTN (hypertension) 09/20/2015   Benign neoplasm of colon 11/02/2014   Internal hemorrhoids 11/02/2014   Adjustment disorder with mixed anxiety and depressed mood 08/16/2014   Orthostatic hypotension 08/08/2014   Esophageal reflux 05/23/2014   Diverticulosis of colon without hemorrhage 05/17/2014   Peripheral polyneuropathy 05/05/2014   Tobacco dependence 05/05/2014   COPD (chronic obstructive pulmonary disease) (HCC) 03/29/2013   Noncompliance with diabetes treatment 02/27/2013   Obstructive sleep apnea 02/25/2013   Chronic systolic congestive heart failure (HCC) 05/21/2012   Lichen sclerosus et atrophicus 05/20/2012   Bronchitis, chronic obstructive (HCC) 05/20/2012   Uncontrolled type 2 diabetes mellitus with hypoglycemia, with long-term current use of insulin  (HCC) 08/28/2011   History of lumbar fusion 06/29/2011   Depression 06/24/2011   Hypothyroidism 05/03/2011   CAD S/P percutaneous coronary angioplasty 02/24/2011   Tobacco abuse 02/24/2011   Mixed hyperlipidemia 02/24/2011   Postural dizziness with near syncope 02/24/2011    Past Surgical History:  Procedure Laterality Date   ABDOMINAL AORTOGRAM W/LOWER EXTREMITY N/A  09/12/2020   Procedure: ABDOMINAL AORTOGRAM W/LOWER EXTREMITY;  Surgeon: Wenona Hamilton, MD;  Location: [facility] INVASIVE CV LAB;  Service: Cardiovascular;  Laterality: N/A;   ABDOMINAL HYSTERECTOMY     APPENDECTOMY     BACK SURGERY     CARDIAC CATHETERIZATION N/A 01/01/2016   Procedure: Left Heart Cath and Coronary Angiography;  Surgeon: Lucendia Rusk, MD;  Location: [facility] INVASIVE CV LAB;  Service: Cardiovascular;  Laterality: N/A;   CATARACT EXTRACTION W/PHACO Left 01/30/2015   Procedure: CATARACT EXTRACTION PHACO AND INTRAOCULAR LENS PLACEMENT (IOC);  Surgeon: Clair Crews, MD;  Location: [facility] ORS;  Service: Ophthalmology;  Laterality: Left;  US  00:47    CATARACT EXTRACTION W/PHACO Right 02/13/2015   Procedure: CATARACT EXTRACTION PHACO AND INTRAOCULAR LENS PLACEMENT (IOC);  Surgeon: Clair Crews, MD;  Location: [facility] ORS;  Service: Ophthalmology;  Laterality: Right;  cassette lot # [id number] H US   00:29.9 AP  20.7 CDE  6.20   COLONOSCOPY N/A 11/02/2014   Procedure: COLONOSCOPY;  Surgeon: Claudette Cue, MD;  Location: [facility] ENDOSCOPY;  Service: Endoscopy;  Laterality: N/A;   CORONARY ANGIOPLASTY WITH STENT PLACEMENT Left 02/06/2011   Procedure: CORONARY  ANGIOPLASTY WITH STENT PLACEMENT; Location: [facility]; Surgeon: Thais Fill, MD   CORONARY ANGIOPLASTY WITH STENT PLACEMENT Left 09/17/2011   Procedure: CORONARY ANGIOPLASTY WITH STENT PLACEMENT; Location: [facility]; Surgeon: Thais Fill, MD   CORONARY ANGIOPLASTY WITH STENT PLACEMENT Left 10/02/2011   Procedure: CORONARY ANGIOPLASTY WITH STENT PLACEMENT; Location: [facility]; Surgeon: Thais Fill, MD   CORONARY ANGIOPLASTY WITH STENT PLACEMENT Left 08/19/2012   Procedure: CORONARY ANGIOPLASTY WITH STENT PLACEMENT; Location: [facility]; Surgeon: Antionette Kirks, MD   ESOPHAGOGASTRODUODENOSCOPY (EGD) WITH PROPOFOL  N/A 09/21/2018   Procedure: ESOPHAGOGASTRODUODENOSCOPY (EGD) WITH PROPOFOL ;  Surgeon: Luke Salaam, MD;  Location: [facility] ENDOSCOPY;   Service: Gastroenterology;  Laterality: N/A;   ESOPHAGOGASTRODUODENOSCOPY (EGD) WITH PROPOFOL  N/A 10/06/2022   Procedure: ESOPHAGOGASTRODUODENOSCOPY (EGD) WITH PROPOFOL ;  Surgeon: Luke Salaam, MD;  Location: [facility] ENDOSCOPY;  Service: Gastroenterology;  Laterality: N/A;   EYE SURGERY     LEFT HEART CATH AND CORONARY ANGIOGRAPHY Left 08/02/2014   Procedure: LEFT HEART CATH AND CORONARY ANGIOGRAPHY; Location: [facility]; Surgeon: Randene Bustard, MD   PERIPHERAL VASCULAR ATHERECTOMY Left 09/12/2020   Procedure: PERIPHERAL VASCULAR ATHERECTOMY;  Surgeon: Wenona Hamilton, MD;  Location: [facility] INVASIVE CV LAB;  Service: Cardiovascular;  Laterality: Left;   PERIPHERAL VASCULAR BALLOON ANGIOPLASTY  09/12/2020   Procedure: PERIPHERAL VASCULAR BALLOON ANGIOPLASTY;  Surgeon: Wenona Hamilton, MD;  Location: [facility] INVASIVE CV LAB;  Service: Cardiovascular;;   REVERSE SHOULDER ARTHROPLASTY Left 10/01/2021   Procedure: REVERSE SHOULDER ARTHROPLASTY WITH BICEPS TENODESIS.;  Surgeon: Elner Hahn, MD;  Location: [facility] ORS;  Service: Orthopedics;  Laterality: Left;   SHOULDER SURGERY Left 2017    OB History   No obstetric history on file.      Home Medications    Prior to Admission medications   Medication Sig Start Date End Date Taking? Authorizing Provider  albuterol  (VENTOLIN  HFA) 108 (90 Base) MCG/ACT inhaler Inhale 2 puffs into the lungs every 6 (six) hours as needed for wheezing or shortness of breath. 01/14/22   Gabriel John, NP  atorvastatin  (LIPITOR ) 80 MG tablet Take 80 mg by mouth daily. 06/06/16   [provider]  carvedilol  (COREG ) 3.125 MG tablet Take 1 tablet (3.125 mg total) by mouth 2 (two) times daily. 02/17/22   Dunn, Elvia Hammans, PA-C  clobetasol  cream (TEMOVATE ) 0.05 % Apply 1 Application topically 2 (two) times daily as needed. For vaginal itching. 04/16/22   Clark, Katherine K, NP  clopidogrel  (PLAVIX ) 75 MG tablet Take 1 tablet (75 mg total) by mouth daily. Patient not taking:  Reported on 06/17/2023 02/17/22   Roark Chick, PA-C  Continuous Glucose Receiver (FREESTYLE LIBRE 2 READER) DEVI Use with sensor to check blood sugars 6 times daily. 07/23/23   Gabriel John, NP  Continuous Glucose Sensor (FREESTYLE LIBRE 2 SENSOR) MISC USE ONE SENSOR EVERY 14 DAYS TO CHECK BLOOD SUGAR SIX TIMES PER DAY 07/23/23   Clark, Katherine K, NP  DULoxetine  (CYMBALTA ) 60 MG capsule Take 60 mg by mouth daily.    [provider]  ezetimibe  (ZETIA ) 10 MG tablet Take 1 tablet (10 mg total) by mouth daily. For cholesterol. 04/12/21   Visser, Jacquelyn D, PA-C  famotidine  (PEPCID ) 20 MG tablet Take 1 tablet (20 mg total) by mouth at bedtime. For nausea and acid 09/19/22   Clark, Katherine K, NP  furosemide  (LASIX ) 20 MG tablet Take 1 tablet (20 mg total) by mouth as needed (As needed for weight gain greater than 3 pounds overnight or increased shortness of breath.). Take one tab only  on Monday, Wednesday, & Fridays 08/29/22   Roark Chick, PA-C  gabapentin  (NEURONTIN ) 300 MG capsule TAKE 1 CAPSULE BY MOUTH TWICE DAILY FOR PAIN 03/13/23   Clark, Katherine K, NP  Glucagon  (GVOKE HYPOPEN  2-PACK) 1 MG/0.2ML SOAJ Inject 1 mg (1 pen) as needed for severe low blood sugar (sustained glucose less than 55 despite oral glucose treatments). May repeat in 15 minutes as needed. 11/20/22   Gabriel John, NP  insulin  aspart (NOVOLOG  FLEXPEN) 100 UNIT/ML FlexPen Inject 6 to 16 units three times daily with meals per sliding scale for diabetes 02/20/23   Clark, Katherine K, NP  Insulin  Pen Needle (INSUPEN PEN NEEDLES) 32G X 4 MM MISC Use to inject insulin  4 times daily. 07/17/20   Clark, Katherine K, NP  isosorbide  mononitrate (IMDUR ) 30 MG 24 hr tablet Take 30 mg by mouth daily.    [provider]  levothyroxine  (SYNTHROID ) 100 MCG tablet TAKE 1 TABLET BY MOUTH IN THE MORNING ON  AN  EMPTY  STOMACH  WITH  WATER   ONLY.  NO  FOOD  OR  OTHER  MEDICATIONS  FOR  30  MINUTES 07/23/23   Clark, Katherine K, NP   mirtazapine  (REMERON ) 15 MG tablet Take 1 tablet (15 mg total) by mouth at bedtime. For sleep and depression 03/03/23   Gabriel John, NP  omeprazole  (PRILOSEC) 40 MG capsule Take 1 capsule (40 mg total) by mouth daily. 09/24/22   Luke Salaam, MD  ondansetron  (ZOFRAN ) 4 MG tablet Take 1 tablet (4 mg total) by mouth every 6 (six) hours as needed for nausea. 10/28/22   Tower, Manley Seeds, MD  ondansetron  (ZOFRAN -ODT) 4 MG disintegrating tablet Take 1 tablet (4 mg total) by mouth every 8 (eight) hours as needed for nausea or vomiting. 09/11/22   Ruth Cove, MD  potassium chloride  (KLOR-CON  M) 10 MEQ tablet Only take on days you take your lasix . Take 1 tablet (10 mEq total) by mouth for 1 dose on those days you take your lasix  20mg  pill. 02/17/22   Dunn, Elvia Hammans, PA-C  sitaGLIPtin  (JANUVIA ) 100 MG tablet Take 1 tablet (100 mg total) by mouth daily. for diabetes. Patient not taking: Reported on 07/29/2023 06/04/23   Clark, Katherine K, NP  umeclidinium-vilanterol (ANORO ELLIPTA ) 62.5-25 MCG/ACT AEPB Inhale 1 puff into the lungs daily. 03/03/23   Gabriel John, NP    Family History Family History  Problem Relation Age of Onset   Heart attack Mother        First MI @ 48 - Died @ 81   Heart disease Mother    Heart disease Father        Died @ 40   Throat cancer Brother    Liver cancer Brother    Colon cancer Sister     Social History Social History   Tobacco Use   Smoking status: Every Day    Current packs/day: 1.00    Average packs/day: 1 pack/day for 45.0 years (45.0 ttl pk-yrs)    Types: Cigarettes   Smokeless tobacco: Never   Tobacco comments:    Has cut back, trying to quit.   Vaping Use   Vaping status: Never Used  Substance Use Topics   Alcohol use: No    Alcohol/week: 0.0 standard drinks of alcohol   Drug use: Yes    Types: Marijuana    Comment: last noc     Allergies   Patient has no known allergies.   Review of Systems Review  of Systems  Musculoskeletal:   Positive for back pain and gait problem. Negative for joint swelling.  Skin:  Negative for color change, rash and wound.  Neurological:  Negative for weakness and numbness.     Physical Exam Triage Vital Signs ED Triage Vitals  Encounter Vitals Group     BP      Systolic BP Percentile      Diastolic BP Percentile      Pulse      Resp      Temp      Temp src      SpO2      Weight      Height      Head Circumference      Peak Flow      Pain Score      Pain Loc      Pain Education      Exclude from Growth Chart    No data found.  Updated Vital Signs BP 121/75   Pulse 73   Temp 98 F (36.7 C)   Resp 18   SpO2 96%   Visual Acuity Right Eye Distance:   Left Eye Distance:   Bilateral Distance:    Right Eye Near:   Left Eye Near:    Bilateral Near:     Physical Exam Constitutional:      General: She is not in acute distress. HENT:     Mouth/Throat:     Mouth: Mucous membranes are moist.  Cardiovascular:     Rate and Rhythm: Normal rate and regular rhythm.  Pulmonary:     Effort: Pulmonary effort is normal. No respiratory distress.  Musculoskeletal:        General: Tenderness present. No swelling or deformity. Normal range of motion.  Skin:    General: Skin is warm and dry.     Capillary Refill: Capillary refill takes less than 2 seconds.     Findings: No bruising, erythema, lesion or rash.  Neurological:     General: No focal deficit present.     Mental Status: She is alert and oriented to person, place, and time.     Sensory: No sensory deficit.     Motor: No weakness.     Gait: Gait normal.      UC Treatments / Results  Labs (all labs ordered are listed, but only abnormal results are displayed) Labs Reviewed - No data to display  EKG   Radiology DG Sacrum/Coccyx Result Date: 08/05/2023 CLINICAL DATA:  Pain status post fall. Lower back and tailbone pain after falling 5 days ago. Ground level fall onto ice. EXAM: SACRUM AND COCCYX - 2+ VIEW  COMPARISON:  Sacroiliac radiographs 01/16/2021 FINDINGS: Mild bilateral sacroiliac subchondral sclerosis is similar to prior. The joint spaces are preserved. Moderate pubic symphysis joint space narrowing and subchondral sclerosis, similar to prior. No significant femoroacetabular joint space narrowing. Bowel-gas and stool obscure portions of the mid to lower sacrum on frontal view. Normal sacral and coccyx alignment on lateral view. No definite acute fracture is seen. Partial visualization of bilateral transpedicular rod and screw fusion hardware at L3 through S1. Moderate to high-grade atherosclerotic calcifications. IMPRESSION: 1. No definite acute fracture is seen. 2. 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 Electronically Signed   By: Bertina Broccoli M.D.   On: 08/05/2023 14:37    Procedures Procedures (including critical care time)  Medications Ordered in UC Medications - No data to display  Initial Impression /  Assessment and Plan / UC Course  I have reviewed the triage vital signs and the nursing notes.  Pertinent labs & imaging results that were available during my care of the patient were reviewed by me and considered in my medical decision making (see chart for details).    Coccyx pain.  Xray negative for acute abnormality.  Discussed Tylenol  as needed for discomfort.  Education provided on tailbone injury.  Instructed patient to follow-up with her PCP or orthopedist.  Contact information for on-call Ortho provided.  Patient agrees to plan of care.    Final Clinical Impressions(s) / UC Diagnoses   Final diagnoses:  Pain in the coccyx     Discharge Instructions      Your xray is pending.  Take Tylenol  as needed for discomfort.  Follow-up with your primary care provider or an orthopedist.     ED Prescriptions   None    PDMP not reviewed this encounter.   Wellington Half, NP 08/05/23 [phone]

## 2023-08-05 NOTE — Discharge Instructions (Addendum)
 Your xray is pending.  Take Tylenol  as needed for discomfort.  Follow-up with your primary care provider or an orthopedist.

## 2023-08-05 NOTE — ED Triage Notes (Addendum)
 Patient to Urgent Care with complaints of lower back/ tail bone pain after falling five days ago.  Ground level fall on ice. Pain radiates/ "burning sensation" into lower back. Initially pain radiated into back into right leg but no long has this problem.

## 2023-08-12 ENCOUNTER — Encounter: Payer: Self-pay | Admitting: Student in an Organized Health Care Education/Training Program

## 2023-08-12 ENCOUNTER — Ambulatory Visit
Admission: RE | Admit: 2023-08-12 | Discharge: 2023-08-12 | Disposition: A | Discharge status: Home / Self Care | Payer: Medicare PPO | Source: Ambulatory Visit | Attending: Student in an Organized Health Care Education/Training Program | Admitting: Student in an Organized Health Care Education/Training Program

## 2023-08-12 ENCOUNTER — Ambulatory Visit
Payer: Medicare PPO | Attending: Student in an Organized Health Care Education/Training Program | Admitting: Student in an Organized Health Care Education/Training Program

## 2023-08-12 DIAGNOSIS — G8929 Other chronic pain: Secondary | ICD-10-CM | POA: Insufficient documentation

## 2023-08-12 DIAGNOSIS — M961 Postlaminectomy syndrome, not elsewhere classified: Secondary | ICD-10-CM

## 2023-08-12 DIAGNOSIS — G894 Chronic pain syndrome: Secondary | ICD-10-CM | POA: Diagnosis not present

## 2023-08-12 DIAGNOSIS — M5416 Radiculopathy, lumbar region: Secondary | ICD-10-CM

## 2023-08-12 MED ORDER — FENTANYL CITRATE (PF) 100 MCG/2ML IJ SOLN
25.0000 ug | INTRAMUSCULAR | Status: DC | PRN
Start: 2023-08-12 — End: 2023-08-12
  Administered 2023-08-12: 50 ug via INTRAVENOUS

## 2023-08-12 MED ORDER — MIDAZOLAM HCL 5 MG/5ML IJ SOLN
INTRAMUSCULAR | Status: AC
  Filled 2023-08-12: qty 5

## 2023-08-12 MED ORDER — ROPIVACAINE HCL 2 MG/ML IJ SOLN
INTRAMUSCULAR | Status: AC
  Filled 2023-08-12: qty 20

## 2023-08-12 MED ORDER — CEPHALEXIN 500 MG PO CAPS: 500.0000 mg | ORAL_CAPSULE | Freq: Four times a day (QID) | ORAL | 0 refills | Status: AC

## 2023-08-12 MED ORDER — FENTANYL CITRATE (PF) 100 MCG/2ML IJ SOLN
INTRAMUSCULAR | Status: AC
  Filled 2023-08-12: qty 2

## 2023-08-12 MED ORDER — MIDAZOLAM HCL 5 MG/5ML IJ SOLN
0.5000 mg | Freq: Once | INTRAMUSCULAR | Status: AC
  Administered 2023-08-12: 1 mg via INTRAVENOUS

## 2023-08-12 MED ORDER — LACTATED RINGERS IV SOLN: Freq: Once | INTRAVENOUS | Status: DC

## 2023-08-12 MED ORDER — LIDOCAINE HCL 2 % IJ SOLN
INTRAMUSCULAR | Status: AC
  Filled 2023-08-12: qty 20

## 2023-08-12 MED ORDER — CEFAZOLIN SODIUM 1 G IJ SOLR
INTRAMUSCULAR | Status: AC
  Filled 2023-08-12: qty 10

## 2023-08-12 MED ORDER — ROPIVACAINE HCL 2 MG/ML IJ SOLN
9.0000 mL | Freq: Once | INTRAMUSCULAR | Status: AC
  Administered 2023-08-12: 9 mL via PERINEURAL

## 2023-08-12 MED ORDER — CEFAZOLIN SODIUM-DEXTROSE 2-4 GM/100ML-% IV SOLN
2.0000 g | INTRAVENOUS | Status: AC
  Administered 2023-08-12: 2 g via INTRAVENOUS
  Filled 2023-08-12: qty 100

## 2023-08-12 MED ORDER — LIDOCAINE HCL 2 % IJ SOLN
20.0000 mL | Freq: Once | INTRAMUSCULAR | Status: AC
  Administered 2023-08-12: 400 mg

## 2023-08-12 NOTE — Patient Instructions (Addendum)
Today we did the following -We have done a Spinal Cord Stimulator Trial with [employer]  -STAY OFF PLAVIX for the duration of your trial.  -As long as the leads are in place, do not bathe or shower. You may sponge bathe.  -While the lead is in place, please limit the bending, lifting, or twisting because the lead can move.  -The things we want to see is if your pain improves (and by what percentage), if you can do more activity (don't overdo it), and if you can use less of your "as needed" medicine. Do not stop long acting medicines like methadone, oxycontin, MS Contin, etc without checking with [place].  -It is VERY important that you pick up the antibiotics we prescribed, Keflex, on your way home from the trial and take them as prescribed(4 times a day), starting today, for as long as the lead is in place.  -The Spina Cord Stimulator Representative will be in contact with you while the lead is in place to make sure the trial goes as well as possible.  -Please contact us with any questions or concerns at any time during the trial.   -If you start running a fever over 100 degrees, have severe back pain, or new pain running down the legs, or drainage coming from the lead site, contact us immediately and/or go to the emergency room.  -Please do not restart any sort of medication that can thin your blood such as Aspirin, ibuprofen, motrin, aleve, plavix, coumadin, etc. If you aren't sure, call and ask.  -We will have you return next Wed to have the lead removed. If this is successful, at that point we can go over the details about the permanent implant.

## 2023-08-12 NOTE — Progress Notes (Signed)
Safety precautions to be maintained throughout the outpatient stay will include: orient to surroundings, keep bed in low position, maintain call bell within reach at all times, provide assistance with transfer out of bed and ambulation.  

## 2023-08-12 NOTE — Progress Notes (Signed)
PROVIDER NOTE: Interpretation of information contained herein should be left to medically-trained personnel. Specific patient instructions are provided elsewhere under "Patient Instructions" section of medical record. This document was created in part using STT-dictation technology, any transcriptional errors that may result from this process are unintentional.  Patient: Sarah Payne Type: Established DOB: [birth year] MRN: [id number] PCP: Doreene Nest, NP  Service: Procedure DOS: 08/12/2023 Setting: Ambulatory Location: Ambulatory outpatient facility Delivery: Face-to-face Provider: Edward Jolly, MD Specialty: Interventional Pain Management Specialty designation: 09 Location: Outpatient facility Ref. Prov.: Edward Jolly, MD       Interventional Therapy   Primary Reason for Admission: Surgical management of chronic pain condition.   Procedure:              Type: Electronics engineer Implant (Percutaneous, interlaminar, posterior epidural placement) Laterality: Bilateral (-50)  Level: Lumbar  Imaging: Fluoroscopic guidance Anesthesia: Local anesthesia (1-2% Lidocaine) Sedation: Moderate Sedation                       DOS: 08/12/2023  Performed by: Edward Jolly, MD  Purpose: Diagnostic. To determine if a permanent implant may be effective in controlling some or all of Sarah Payne's chronic pain symptoms.   Rationale (medical necessity): procedure needed and proper for the diagnosis and/or treatment of Sarah Payne's medical symptoms and needs. 1. Chronic pain syndrome   2. Chronic radicular lumbar pain   3. Failed back surgical syndrome    NAS-11 Pain score:   Pre-procedure: 5 /10   Post-procedure: 0-No pain/10     Target: Posterior epidural space over the dorsal columns of the spinal cord. Location: Posterior intraspinal canal Region: Thoracolumbar  Approach: Translaminar percutaneous  Type of procedure: Surgical   Pt stopped Plavix 7 days  prior  Position / Prep / Materials:  Position: Prone  Prep solution: ChloraPrep (2% chlorhexidine gluconate and 70% isopropyl alcohol) Prep Area: Entire  Posterior  Thoracolumbar  Region  Materials:  Tray: Implant tray Needle(s):  Type: Epidural  Gauge (G):  14   Length: Regular (10cm)  Qty: 2  H&P (Pre-op Assessment):  Sarah Payne is a 78 y.o. (year old), female patient, seen today for interventional treatment. She  has a past surgical history that includes Back surgery; Appendectomy; Abdominal hysterectomy; Colonoscopy (N/A, 11/02/2014); Cataract extraction w/PHACO (Left, 01/30/2015); Coronary angioplasty with stent (Left, 02/06/2011); Coronary angioplasty with stent (Left, 09/17/2011); Cataract extraction w/PHACO (Right, 02/13/2015); Cardiac catheterization (N/A, 01/01/2016); Esophagogastroduodenoscopy (egd) with propofol (N/A, 09/21/2018); Shoulder surgery (Left, 2017); ABDOMINAL AORTOGRAM W/LOWER EXTREMITY (N/A, 09/12/2020); PERIPHERAL VASCULAR ATHERECTOMY (Left, 09/12/2020); PERIPHERAL VASCULAR BALLOON ANGIOPLASTY (09/12/2020); Coronary angioplasty with stent (Left, 10/02/2011); Coronary angioplasty with stent (Left, 08/19/2012); LEFT HEART CATH AND CORONARY ANGIOGRAPHY (Left, 08/02/2014); Reverse shoulder arthroplasty (Left, 10/01/2021); Eye surgery; and Esophagogastroduodenoscopy (egd) with propofol (N/A, 10/06/2022).  Initial Vital Signs:  Pulse/EKG Rate: 63ECG Heart Rate: 60 Temp: (!) 97.3 F (36.3 C) Resp: 20 BP: (!) 153/84 SpO2: 99 %  BMI: Estimated body mass index is 19.85 kg/m as calculated from the following:   Height as of this encounter: 5\' 6"  (1.676 m).   Weight as of this encounter: 123 lb (55.8 kg).  Risk Assessment: Allergies: Reviewed. She has no known allergies.  Allergy Precautions: None required Coagulopathies: Reviewed. None identified.  Blood-thinner therapy: None at this time Active Infection(s): Reviewed. None identified. Sarah Payne is afebrile  Site  Confirmation: Sarah Payne was asked to confirm the procedure and laterality before marking the site, which she  did. Procedure checklist: Completed Consent: Before the procedure and under the influence of no sedative(s), amnesic(s), or anxiolytics, the patient was informed of the treatment options, risks and possible complications. To fulfill our ethical and legal obligations, as recommended by the American Medical Association's Code of Ethics, I have informed the patient of my clinical impression; the nature and purpose of the treatment or procedure; the risks, benefits, and possible complications of the intervention; the alternatives, including doing nothing; the risk(s) and benefit(s) of the alternative treatment(s) or procedure(s); and the risk(s) and benefit(s) of doing nothing.  Sarah Payne was provided with information about the general risks and possible complications associated with most interventional procedures. These include, but are not limited to: failure to achieve desired goals, infection, bleeding, organ or nerve damage, allergic reactions, paralysis, and/or death.  In addition, she was informed of those risks and possible complications associated to this particular procedure, which include, but are not limited to: damage to the implant; failure to decrease pain; local, systemic, or serious CNS infections, intraspinal abscess with possible cord compression and paralysis, or life-threatening such as meningitis; intrathecal and/or epidural bleeding with formation of hematoma with possible spinal cord compression and permanent paralysis; organ damage; nerve injury or damage with subsequent sensory, motor, and/or autonomic system dysfunction, resulting in transient or permanent pain, numbness, and/or weakness of one or several areas of the body; allergic reactions, either minor or major life-threatening, such as anaphylactic or anaphylactoid reactions.  Furthermore, Sarah Payne was informed of those  risks and complications associated with the medications. These include, but are not limited to: allergic reactions (i.e.: anaphylactic or anaphylactoid reactions); arrhythmia;  Hypotension/hypertension; cardiovascular collapse; respiratory depression and/or shortness of breath; swelling or edema; medication-induced neural toxicity; particulate matter embolism and blood vessel occlusion with resultant organ, and/or nervous system infarction and permanent paralysis.  Finally, she was informed that Medicine is not an exact science; therefore, there is also the possibility of unforeseen or unpredictable risks and/or possible complications that may result in a catastrophic outcome. The patient indicated having understood very clearly. We have given the patient no guarantees and we have made no promises. Enough time was given to the patient to ask questions, all of which were answered to the patient's satisfaction. Ms. Manka has indicated that she wanted to continue with the procedure. Attestation: I, the ordering provider, attest that I have discussed with the patient the benefits, risks, side-effects, alternatives, likelihood of achieving goals, and potential problems during recovery for the procedure that I have provided informed consent. Date  Time: 08/12/2023 10:07 AM  Pre-Procedure Preparation:  Monitoring: As per clinic protocol. Respiration, ETCO2, SpO2, BP, heart rate and rhythm monitor placed and checked for adequate function Safety Precautions: Patient was assessed for positional comfort and pressure points before starting the procedure. Time-out: I initiated and conducted the "Time-out" before starting the procedure, as per protocol. The patient was asked to participate by confirming the accuracy of the "Time Out" information. Verification of the correct person, site, and procedure were performed and confirmed by me, the nursing staff, and the patient. "Time-out" conducted as per Joint Commission's  Universal Protocol (UP.01.01.01). Time: 1014 Start Time: 1014 hrs.  Description/Narrative of Procedure:          Rationale (medical necessity): procedure needed and proper for the diagnosis and/or treatment of the patient's medical symptoms and needs. Procedural Technique Safety Precautions: Aspiration looking for blood return was conducted prior to all injections. At no point did we inject any substances, as  a needle was being advanced. No attempts were made at seeking any paresthesias. Safe injection practices and needle disposal techniques used. Medications properly checked for expiration dates. SDV (single dose vial) medications used. Description of the Procedure: Protocol guidelines were followed. The patient was assisted into a comfortable position. The target area was identified and the area prepped in the usual manner. Skin & deeper tissues infiltrated with local anesthetic. Appropriate amount of time allowed to pass for local anesthetics to take effect. The procedure needles were then advanced to the target area. Proper needle placement secured. Negative aspiration confirmed. Solution injected in intermittent fashion, asking for systemic symptoms every 0.5cc of injectate. The needles were then removed and the area cleansed, making sure to leave some of the prepping solution back to take advantage of its long term bactericidal properties.  Technical description of procedure: Availability of a responsible, adult driver, and NPO status confirmed. Informed consent was obtained after having discussed risks and possible complications. An IV was started. The patient was then taken to the fluoroscopy suite, where the patient was placed in position for the procedure, over the fluoroscopy table. The patient was then monitored in the usual manner. Fluoroscopy was manipulated to obtain the best possible view of the target. Parallex error was corrected before commencing the procedure. Once a clear view of the  target had been obtained, the skin and deeper tissues over the procedure site were infiltrated using lidocaine, loaded in a 10 cc luer-loc syringe with a 0.5 inch, 25-G needle. The introducer needle(s) was/were then inserted through the skin and deeper tissues. A paramidline approach was used to enter the posterior epidural space at a 30 angle, using "Loss-of-resistance Technique" with 3 ml of PF-NaCl (0.9% NSS). Correct needle placement was confirmed in the antero-posterior and lateral fluoroscopic views. The lead was gently introduced and manipulated under real-time fluoroscopy, constantly assessing for pain, discomfort, or paresthesias, until the tip rested at the desired level. Both sides were done in identical fashion. Electrode placement was tested until appropriate coverage was attained. Once the patient confirmed that the stimulation was over the desired area, the lead(s) was/were secured in place and the introducer needles removed. This was done under real-time fluoroscopy while observing the electrode tip to avoid unintended migration. The area was covered with a non-occlusive dressing and the patient transported to recovery for further programming.  Vitals:   08/12/23 1040 08/12/23 1045 08/12/23 1055 08/12/23 1106  BP: (!) 149/97 (!) 153/76 (!) 140/69 (!) 149/52  Pulse:      Resp: 19 17 16 16   Temp:  (!) 97.3 F (36.3 C)  (!) 97.3 F (36.3 C)  TempSrc:      SpO2: 100% 96% 96% 97%  Weight:      Height:        Start Time: 1014 hrs. End Time: 1040 hrs.  Neurostimulator Details:   Lead(s):  Brand: [employer]         Epidural Access Level:  T12-L1 T12-L1  Lead implant:  Bilateral   No. of Electrodes/Lead:  16 16  Laterality:  Left Right  Top electrode location:  T7 T7  Model No.: [id number] E Same  Length: 50cm Same  Lot No.: [id number]   Imaging Guidance (Spinal):          Type of Imaging Technique: Fluoroscopy Guidance (Spinal) Indication(s): Fluoroscopy guidance  for needle placement to enhance accuracy in procedures requiring precise needle localization for targeted delivery of medication in or near specific anatomical locations not  easily accessible without such real-time imaging assistance. Exposure Time: Please see nurses notes. Contrast: None used. Fluoroscopic Guidance: I was personally present during the use of fluoroscopy. "Tunnel Vision Technique" used to obtain the best possible view of the target area. Parallax error corrected before commencing the procedure. "Direction-depth-direction" technique used to introduce the needle under continuous pulsed fluoroscopy. Once target was reached, antero-posterior, oblique, and lateral fluoroscopic projection used confirm needle placement in all planes. Images permanently stored in EMR. Interpretation: No contrast injected. I personally interpreted the imaging intraoperatively. Adequate needle placement confirmed in multiple planes. Permanent images saved into the patient's record.      Antibiotic Prophylaxis:   Anti-infectives (From admission, onward)    Start     Dose/Rate Route Frequency Ordered Stop   08/12/23 1013  ceFAZolin (ANCEF) IVPB 2g/100 mL premix        2 g 200 mL/hr over 30 Minutes Intravenous 30 min pre-op 08/12/23 1013 08/12/23 0940   08/12/23 0000  cephALEXin (KEFLEX) 500 MG capsule        500 mg Oral 4 times daily 08/12/23 0955 08/22/23 2359      Indication(s): Implant Prophylaxis.  Post-operative Assessment:  Post-procedure Vital Signs:  Pulse/HCG Rate: 63(!) 58 Temp: (!) 97.3 F (36.3 C) Resp: 16 BP: (!) 149/52 SpO2: 97 %  Complications: No immediate post-treatment complications observed by team, or reported by patient.  Note: The patient tolerated the entire procedure well. A repeat set of vitals were taken after the procedure and the patient was kept under observation following institutional policy, for this type of procedure. Post-procedural neurological assessment  was performed, showing return to baseline, prior to discharge. The patient was provided with post-procedure discharge instructions, including a section on how to identify potential problems. Should any problems arise concerning this procedure, the patient was given instructions to immediately contact us, at any time, without hesitation. In any case, we plan to contact the patient by telephone for a follow-up status report regarding this interventional procedure.  Comments:  No additional relevant information.  Plan of Care  Patient instructed to remain off of Plavix for the duration of her SCS trial.  Orders:  Orders Placed This Encounter  Procedures   DG PAIN [facility] C-ARM 1-60 MIN NO REPORT    Intraoperative interpretation by procedural physician at [facility].    Standing Status:   Standing    Number of Occurrences:   1    Reason for exam::   Assistance in needle guidance and placement for procedures requiring needle placement in or near specific anatomical locations not easily accessible without such assistance.    Medications administered: We administered lidocaine, midazolam, fentaNYL, ropivacaine (PF) 2 mg/mL (0.2%), and ceFAZolin.  See the medical record for exact dosing, route, and time of administration.  Follow-up plan:   Return in about 1 week (around 08/19/2023) for SCS lead pull.       Left C-ESI, L-ESI, Qutenza 03/04/23. L2/3 ESI 06/17/23    Recent Visits Date Type Provider Dept  06/17/23 Procedure visit Edward Jolly, MD [facility]  06/08/23 Procedure visit Edward Jolly, MD [facility]  Showing recent visits within past 90 days and meeting all other requirements Today's Visits Date Type Provider Dept  08/12/23 Procedure visit Edward Jolly, MD [facility]  Showing today's visits and meeting all other requirements Future Appointments Date Type Provider Dept  08/19/23 Appointment Edward Jolly, MD [facility]   Showing future appointments within next 90 days and  meeting all other requirements  Disposition: Discharge home  Discharge (Date  Time): 08/12/2023; 1155 hrs.   Primary Care Physician: Doreene Nest, NP Location: [facility] Note by: Edward Jolly, MD (TTS technology used. I apologize for any typographical errors that were not detected and corrected.) Date: 08/12/2023; Time: 12:06 PM

## 2023-08-13 ENCOUNTER — Telehealth: Payer: Self-pay | Admitting: *Deleted

## 2023-08-13 NOTE — Telephone Encounter (Signed)
No problems post procedure. 

## 2023-08-14 ENCOUNTER — Other Ambulatory Visit: Payer: Self-pay | Admitting: Primary Care

## 2023-08-14 DIAGNOSIS — E1151 Type 2 diabetes mellitus with diabetic peripheral angiopathy without gangrene: Secondary | ICD-10-CM

## 2023-08-19 ENCOUNTER — Ambulatory Visit
Admission: RE | Admit: 2023-08-19 | Discharge: 2023-08-19 | Disposition: A | Discharge status: Home / Self Care | Payer: Medicare PPO | Source: Ambulatory Visit | Attending: Student in an Organized Health Care Education/Training Program | Admitting: Student in an Organized Health Care Education/Training Program

## 2023-08-19 ENCOUNTER — Encounter: Payer: Self-pay | Admitting: Student in an Organized Health Care Education/Training Program

## 2023-08-19 ENCOUNTER — Ambulatory Visit
Payer: Medicare PPO | Attending: Student in an Organized Health Care Education/Training Program | Admitting: Student in an Organized Health Care Education/Training Program

## 2023-08-19 VITALS — BP 150/92 | HR 72 | Temp 97.5°F | Resp 16 | Ht 66.0 in | Wt 123.0 lb

## 2023-08-19 DIAGNOSIS — G8929 Other chronic pain: Secondary | ICD-10-CM

## 2023-08-19 DIAGNOSIS — E1140 Type 2 diabetes mellitus with diabetic neuropathy, unspecified: Secondary | ICD-10-CM

## 2023-08-19 DIAGNOSIS — G894 Chronic pain syndrome: Secondary | ICD-10-CM

## 2023-08-19 DIAGNOSIS — M5416 Radiculopathy, lumbar region: Secondary | ICD-10-CM

## 2023-08-19 DIAGNOSIS — M961 Postlaminectomy syndrome, not elsewhere classified: Secondary | ICD-10-CM

## 2023-08-19 NOTE — Patient Instructions (Signed)

## 2023-08-19 NOTE — Progress Notes (Signed)
PROVIDER NOTE: Information contained herein reflects review and annotations entered in association with encounter. Interpretation of such information and data should be left to medically-trained personnel. Information provided to patient can be located elsewhere in the medical record under "Patient Instructions". Document created using STT-dictation technology, any transcriptional errors that may result from process are unintentional.    Patient: Sarah Payne  Service Category: E/M  Provider: Edward Jolly, MD  DOB: [birth year]  DOS: 08/19/2023  Referring Provider: Doreene Nest, NP  MRN: [id number]  Specialty: Interventional Pain Management  PCP: Doreene Nest, NP  Type: Established Patient  Setting: Ambulatory outpatient    Location: Office  Delivery: Face-to-face     HPI  [name], a 78 y.o. year old female, is here today because of her Chronic radicular lumbar pain [M54.16, G89.29]. Ms. Val primary complain today is Back Pain (lower)  Pertinent problems: Ms. Frede has CAD S/P percutaneous coronary angioplasty; History of lumbar fusion; Uncontrolled type 2 diabetes mellitus with hypoglycemia, with long-term current use of insulin (HCC); Bronchitis, chronic obstructive (HCC); Obstructive sleep apnea; COPD (chronic obstructive pulmonary disease) (HCC); Lumbar degenerative disc disease; Lumbar spondylosis with myelopathy; Cannabis use disorder, mild, abuse; Peripheral polyneuropathy; Tobacco dependence; Chronic pain syndrome; Chronic neck pain; Chronic pain of right upper extremity; Cervical spondylosis; and Chronic musculoskeletal pain on their pertinent problem list. Pain Assessment: Severity of Chronic pain is reported as a 0-No pain/10. Location: Back Lower/denies. Onset: More than a month ago. Quality: Sharp. Timing: Intermittent. Modifying factor(s): lying down. Vitals:  height is 5\' 6"  (1.676 m) and weight is 123 lb (55.8 kg). Her temporal temperature is 97.5 F (36.4 C)  (abnormal). Her blood pressure is 150/92 (abnormal) and her pulse is 72. Her respiration is 16 and oxygen saturation is 99%.  BMI: Estimated body mass index is 19.85 kg/m as calculated from the following:   Height as of this encounter: 5\' 6"  (1.676 m).   Weight as of this encounter: 123 lb (55.8 kg). Last encounter: 04/09/2023. Last procedure: 08/12/2023.  Reason for encounter: post-procedure evaluation and assessment.   Status post [employer] spinal cord stimulator trial for failed back surgical syndrome, chronic painful diabetic neuropathic pain, chronic lumbar radicular pain.  Patient states that she experienced 100% pain relief with a spinal cord stimulator leads in place.  She would like to move forward with permanent implant.  I will send a referral to Dr. Katrinka Blazing with neurosurgery.     Medication Review  DULoxetine, FreeStyle Libre 2 Reader, FreeStyle Libre 2 Sensor, Glucagon, Insulin Pen Needle, albuterol, atorvastatin, carvedilol, cephALEXin, clobetasol cream, clopidogrel, ezetimibe, famotidine, furosemide, gabapentin, insulin aspart, isosorbide mononitrate, levothyroxine, mirtazapine, omeprazole, ondansetron, potassium chloride, sitaGLIPtin, and umeclidinium-vilanterol  History Review  Allergy: Ms. Porco has no known allergies. Drug: Ms. Schmuck  reports current drug use. Drug: Marijuana. Alcohol:  reports no history of alcohol use. Tobacco:  reports that she has been smoking cigarettes. She has a 45 pack-year smoking history. She has never used smokeless tobacco. Social: Ms. Abad  reports that she has been smoking cigarettes. She has a 45 pack-year smoking history. She has never used smokeless tobacco. She reports current drug use. Drug: Marijuana. She reports that she does not drink alcohol. Medical:  has a past medical history of Acute pain of left shoulder (01/17/2019), Acute renal failure superimposed on stage 3b chronic kidney disease (HCC) (09/03/2022), Aortic  atherosclerosis (HCC), Bilateral cataracts (01/2015), CHF (congestive heart failure) (HCC) (09/16/2013), Chronic low back pain, Clostridioides difficile infection (  2015), COPD (chronic obstructive pulmonary disease) (HCC), Coronary artery disease, Depression, Diverticulosis, Frequent PVCs, GERD (gastroesophageal reflux disease), History of heart artery stent, Hyperlipidemia, Hypertension, Hypothyroidism, Impingement syndrome of left shoulder, LBBB (left bundle branch block), Left flank pain (10/28/2022), Long term current use of antithrombotics/antiplatelets, Marijuana abuse, Myocardial infarction (HCC), OSA (obstructive sleep apnea), Pancreatitis (09/19/2022), Paroxysmal SVT (supraventricular tachycardia) (HCC) (05/08/2021), T2DM (type 2 diabetes mellitus) (HCC), Takotsubo cardiomyopathy, Tendonitis of left rotator cuff, Tobacco abuse, Vaginal burning (03/22/2019), and Vascular dementia. Surgical: Ms. Cassel  has a past surgical history that includes Back surgery; Appendectomy; Abdominal hysterectomy; Colonoscopy (N/A, 11/02/2014); Cataract extraction w/PHACO (Left, 01/30/2015); Coronary angioplasty with stent (Left, 02/06/2011); Coronary angioplasty with stent (Left, 09/17/2011); Cataract extraction w/PHACO (Right, 02/13/2015); Cardiac catheterization (N/A, 01/01/2016); Esophagogastroduodenoscopy (egd) with propofol (N/A, 09/21/2018); Shoulder surgery (Left, 2017); ABDOMINAL AORTOGRAM W/LOWER EXTREMITY (N/A, 09/12/2020); PERIPHERAL VASCULAR ATHERECTOMY (Left, 09/12/2020); PERIPHERAL VASCULAR BALLOON ANGIOPLASTY (09/12/2020); Coronary angioplasty with stent (Left, 10/02/2011); Coronary angioplasty with stent (Left, 08/19/2012); LEFT HEART CATH AND CORONARY ANGIOGRAPHY (Left, 08/02/2014); Reverse shoulder arthroplasty (Left, 10/01/2021); Eye surgery; and Esophagogastroduodenoscopy (egd) with propofol (N/A, 10/06/2022). Family: family history includes Colon cancer in her sister; Heart attack in her mother; Heart  disease in her father and mother; Liver cancer in her brother; Throat cancer in her brother.  Laboratory Chemistry Profile   Renal Lab Results  Component Value Date   BUN 20 03/03/2023   CREATININE 1.20 03/03/2023   BCR 14 09/24/2022   GFR 44.44 (L) 03/03/2023   GFRAA 55 (L) 08/30/2020   GFRNONAA 38 (L) 09/11/2022    Hepatic Lab Results  Component Value Date   AST 14 10/28/2022   ALT 9 10/28/2022   ALBUMIN 4.2 10/28/2022   ALKPHOS 86 10/28/2022   HCVAB NON REACTIVE 09/04/2022   AMYLASE 32 04/11/2016   LIPASE 28.0 10/28/2022    Electrolytes Lab Results  Component Value Date   NA 137 03/03/2023   K 4.0 03/03/2023   CL 101 03/03/2023   CALCIUM 9.6 03/03/2023   MG 1.6 (L) 10/03/2021    Bone Lab Results  Component Value Date   VD25OH 41 02/25/2013    Inflammation (CRP: Acute Phase) (ESR: Chronic Phase) Lab Results  Component Value Date   CRP 2.1 04/11/2013   ESRSEDRATE 58 (H) 04/11/2013         Note: Above Lab results reviewed.  Recent Imaging Review  DG PAIN CLINIC C-ARM 1-60 MIN NO REPORT Fluoro was used, but no Radiologist interpretation will be provided.  Please refer to "NOTES" tab for provider progress note. Note: Reviewed        Physical Exam  General appearance: Well nourished, well developed, and well hydrated. In no apparent acute distress Mental status: Alert, oriented x 3 (person, place, & time)       Respiratory: No evidence of acute respiratory distress Eyes: PERLA Vitals: BP (!) 150/92   Pulse 72   Temp (!) 97.5 F (36.4 C) (Temporal)   Resp 16   Ht 5\' 6"  (1.676 m)   Wt 123 lb (55.8 kg)   SpO2 99%   BMI 19.85 kg/m  BMI: Estimated body mass index is 19.85 kg/m as calculated from the following:   Height as of this encounter: 5\' 6"  (1.676 m).   Weight as of this encounter: 123 lb (55.8 kg). Ideal: Ideal body weight: 59.3 kg (130 lb 11.7 oz)  Trial leads removed under laparoscopy, tips intact  Assessment   Diagnosis Status  1.  Chronic radicular lumbar pain  2. Failed back surgical syndrome   3. Chronic pain syndrome   4. Chronic painful diabetic neuropathy (HCC)    Controlled Controlled Controlled   Updated Problems: No problems updated.  Plan of Care  Referral to Dr. Katrinka Blazing with neurosurgery to discuss permanent implant.  Orders:  Orders Placed This Encounter  Procedures   DG PAIN CLINIC C-ARM 1-60 MIN NO REPORT    Intraoperative interpretation by procedural physician at [facility].    Standing Status:   Standing    Number of Occurrences:   1    Reason for exam::   Assistance in needle guidance and placement for procedures requiring needle placement in or near specific anatomical locations not easily accessible without such assistance.   Ambulatory referral to Neurosurgery    Referral Priority:   Routine    Referral Type:   Surgical    Referral Reason:   Specialty Services Required    Referred to Provider:   Lovenia Kim, MD    Requested Specialty:   Neurosurgery    Number of Visits Requested:   1   Follow-up plan:   Return for patient will call to schedule F2F appt prn.      Recent Visits Date Type Provider Dept  08/12/23 Procedure visit Edward Jolly, MD [facility]  06/17/23 Procedure visit Edward Jolly, MD [facility]  06/08/23 Procedure visit Edward Jolly, MD [facility]  Showing recent visits within past 90 days and meeting all other requirements Today's Visits Date Type Provider Dept  08/19/23 Procedure visit Edward Jolly, MD [facility]  Showing today's visits and meeting all other requirements Future Appointments No visits were found meeting these conditions. Showing future appointments within next 90 days and meeting all other requirements  I discussed the assessment and treatment plan with the patient. The patient was provided an opportunity to ask questions and all were answered. The patient agreed with the plan and  demonstrated an understanding of the instructions.  Patient advised to call back or seek an in-person evaluation if the symptoms or condition worsens.  Duration of encounter: .  Total time on encounter, as per AMA guidelines included both the face-to-face and non-face-to-face time personally spent by the physician and/or other qualified health care professional(s) on the day of the encounter (includes time in activities that require the physician or other qualified health care professional and does not include time in activities normally performed by clinical staff). Physician's time may include the following activities when performed: Preparing to see the patient (e.g., pre-charting review of records, searching for previously ordered imaging, lab work, and nerve conduction tests) Review of prior analgesic pharmacotherapies. Reviewing PMP Interpreting ordered tests (e.g., lab work, imaging, nerve conduction tests) Performing post-procedure evaluations, including interpretation of diagnostic procedures Obtaining and/or reviewing separately obtained history Performing a medically appropriate examination and/or evaluation Counseling and educating the patient/family/caregiver Ordering medications, tests, or procedures Referring and communicating with other health care professionals (when not separately reported) Documenting clinical information in the electronic or other health record Independently interpreting results (not separately reported) and communicating results to the patient/ family/caregiver Care coordination (not separately reported)  Note by: Edward Jolly, MD Date: 08/19/2023; Time: 10:47 AM

## 2023-08-20 ENCOUNTER — Telehealth: Payer: Self-pay

## 2023-08-20 NOTE — Telephone Encounter (Signed)
Post procedure follow up. lm

## 2023-08-28 NOTE — Progress Notes (Signed)
 Referring Physician:  Cephus Collin, MD [address] [place],  [place] [address]  Primary Physician:  Gabriel John, NP  History of Present Illness: 08/31/2023 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  She has a history of heart disease as well as smoking.  She had a recent spinal cord stimulator trial placement with Dr. Rhesa Celeste and had 100% recovery of her lower extremity pain.  She has had her psychotherapy evaluation as well as her MRI of her thoracic spine.  She is enthusiastic to move forward with a placement of a spinal cord stimulator.  The symptoms are causing a significant impact on the patient's life.   I have utilized the care everywhere function in epic to review the outside records available from external health systems, I have also reviewed the notes from Dr. Rhesa Celeste who has performed an extensive workup.  He is also performed an extensive treatment regimen which led to a spinal cord stimulator trial which had not 100% recovery of her pain.  Review of Systems:  A 10 point review of systems is negative, except for the pertinent positives and negatives detailed in the HPI.  Past Medical History: Past Medical History:  Diagnosis Date   Acute pain of left shoulder 01/17/2019   Acute renal failure superimposed on stage 3b chronic kidney disease (HCC) 09/03/2022   Aortic atherosclerosis (HCC)    Bilateral cataracts 01/2015   a.) s/p extraction   CHF (congestive heart failure) (HCC) 09/16/2013   a.) TTE 09/16/2013: EF 55-60%; mod inferior HK, triv TR; G1DD. b.) TTE 01/02/2016: EF 25-30%; mid-apicalateroseptal, lat, inf, inferoseptal, apical akinesis; triv TR; G1DD. c. TTE 04/16/2016: EF 65%; G1DD. d.) TTE 05/02/2020: EF 50-55%; G2DD. e.) TTE 04/11/2021: EF 55%; G1DD.   Chronic low back pain    Clostridioides difficile infection 2015   COPD  (chronic obstructive pulmonary disease) (HCC)    Coronary artery disease    a.) PCI 02/06/2011: 100% mLCx - DES x 3. b.) PCI 09/17/2011: 95% dRCA - DES x 1. c.) PCI 10/02/2011: 75% pLCX - DES x 1. d.) PCI 08/19/2012: DES x 2 p-mRCA.  e.) LHC 01/01/16: Takotsubo event 01/01/2016 with patent stents   Depression    Diverticulosis    Frequent PVCs    a. Noted in hospital 12/2015.   GERD (gastroesophageal reflux disease)    History of heart artery stent    a.) TOTAL of 7 stents: a.) 02/06/2011 - overlapping 2.5x12 mm Xience V, 2.5x8 mm Xience V, 2.25x8 mm Xience Nano to mLCx. b.) 09/17/2011 - 2.5x23 Xience V dRCA. c.) 10/02/2011 - 2.5x15 mm Xience V pLCx. d.) 08/09/2012 - overlapping 3.0x34mm mRCA and 3.5x61mm pRCA   Hyperlipidemia    Hypertension    Hypothyroidism    Impingement syndrome of left shoulder    LBBB (left bundle branch block)    Left flank pain 10/28/2022   Long term current use of antithrombotics/antiplatelets    a.) clopidogrel    Marijuana abuse    Myocardial infarction [facility])    a.) multiple MIs;  5 per her report   OSA (obstructive sleep apnea)    a.) mild; does not require nocturnal PAP therapy   Pancreatitis 09/19/2022   Paroxysmal SVT (supraventricular tachycardia) (HCC) 05/08/2021   a.)  Zio patch 05/08/2021: 3 distinct SVT runs; fastest 5 beats at a rate of 146 bpm; longest 7 beats at rate of  134 bpm   T2DM (type 2 diabetes mellitus) (HCC)    Takotsubo cardiomyopathy    a. 12/2015 - nephew committed suicide 1 week prior, sister died the morning of presentation - initially called a STEMI; cath with patent stents. LVEF 25-30%.   Tendonitis of left rotator cuff    Tobacco abuse    Vaginal burning 03/22/2019   Vascular dementia     Past Surgical History: Past Surgical History:  Procedure Laterality Date   ABDOMINAL AORTOGRAM W/LOWER EXTREMITY N/A 09/12/2020   Procedure: ABDOMINAL AORTOGRAM W/LOWER EXTREMITY;  Surgeon: Wenona Hamilton, MD;  Location: [facility] INVASIVE CV  LAB;  Service: Cardiovascular;  Laterality: N/A;   ABDOMINAL HYSTERECTOMY     APPENDECTOMY     BACK SURGERY     CARDIAC CATHETERIZATION N/A 01/01/2016   Procedure: Left Heart Cath and Coronary Angiography;  Surgeon: Lucendia Rusk, MD;  Location: [facility] INVASIVE CV LAB;  Service: Cardiovascular;  Laterality: N/A;   CATARACT EXTRACTION W/PHACO Left 01/30/2015   Procedure: CATARACT EXTRACTION PHACO AND INTRAOCULAR LENS PLACEMENT (IOC);  Surgeon: Clair Crews, MD;  Location: [facility] ORS;  Service: Ophthalmology;  Laterality: Left;  US  00:47    CATARACT EXTRACTION W/PHACO Right 02/13/2015   Procedure: CATARACT EXTRACTION PHACO AND INTRAOCULAR LENS PLACEMENT (IOC);  Surgeon: Clair Crews, MD;  Location: [facility] ORS;  Service: Ophthalmology;  Laterality: Right;  cassette lot # [id number] H [id number]   00:29.9 AP  20.7 CDE  6.20   COLONOSCOPY N/A 11/02/2014   Procedure: COLONOSCOPY;  Surgeon: Claudette Cue, MD;  Location: [facility] ENDOSCOPY;  Service: Endoscopy;  Laterality: N/A;   CORONARY ANGIOPLASTY WITH STENT PLACEMENT Left 02/06/2011   Procedure: CORONARY ANGIOPLASTY WITH STENT PLACEMENT; Location: [facility]; Surgeon: Thais Fill, MD   CORONARY ANGIOPLASTY WITH STENT PLACEMENT Left 09/17/2011   Procedure: CORONARY ANGIOPLASTY WITH STENT PLACEMENT; Location: [facility]; Surgeon: Thais Fill, MD   CORONARY ANGIOPLASTY WITH STENT PLACEMENT Left 10/02/2011   Procedure: CORONARY ANGIOPLASTY WITH STENT PLACEMENT; Location: [facility]; Surgeon: Thais Fill, MD   CORONARY ANGIOPLASTY WITH STENT PLACEMENT Left 08/19/2012   Procedure: CORONARY ANGIOPLASTY WITH STENT PLACEMENT; Location: [facility]; Surgeon: Antionette Kirks, MD   ESOPHAGOGASTRODUODENOSCOPY (EGD) WITH PROPOFOL  N/A 09/21/2018   Procedure: ESOPHAGOGASTRODUODENOSCOPY (EGD) WITH PROPOFOL ;  Surgeon: Luke Salaam, MD;  Location: [facility] ENDOSCOPY;  Service: Gastroenterology;  Laterality: N/A;   ESOPHAGOGASTRODUODENOSCOPY (EGD) WITH PROPOFOL  N/A 10/06/2022   Procedure:  ESOPHAGOGASTRODUODENOSCOPY (EGD) WITH PROPOFOL ;  Surgeon: Luke Salaam, MD;  Location: [facility] ENDOSCOPY;  Service: Gastroenterology;  Laterality: N/A;   EYE SURGERY     LEFT HEART CATH AND CORONARY ANGIOGRAPHY Left 08/02/2014   Procedure: LEFT HEART CATH AND CORONARY ANGIOGRAPHY; Location: [facility]; Surgeon: Randene Bustard, MD   PERIPHERAL VASCULAR ATHERECTOMY Left 09/12/2020   Procedure: PERIPHERAL VASCULAR ATHERECTOMY;  Surgeon: Wenona Hamilton, MD;  Location: [facility] INVASIVE CV LAB;  Service: Cardiovascular;  Laterality: Left;   PERIPHERAL VASCULAR BALLOON ANGIOPLASTY  09/12/2020   Procedure: PERIPHERAL VASCULAR BALLOON ANGIOPLASTY;  Surgeon: Wenona Hamilton, MD;  Location: [facility] INVASIVE CV LAB;  Service: Cardiovascular;;   REVERSE SHOULDER ARTHROPLASTY Left 10/01/2021   Procedure: REVERSE SHOULDER ARTHROPLASTY WITH BICEPS TENODESIS.;  Surgeon: Elner Hahn, MD;  Location: [facility] ORS;  Service: Orthopedics;  Laterality: Left;   SHOULDER SURGERY Left 2017    Allergies: Allergies as of 08/31/2023   (No Known Allergies)    Medications:  Current Outpatient Medications:    albuterol  (VENTOLIN  HFA) 108 (90 Base) MCG/ACT inhaler, Inhale 2 puffs into the lungs every 6 (six) hours as  needed for wheezing or shortness of breath., Disp: 8 g, Rfl: 0   atorvastatin  (LIPITOR ) 80 MG tablet, Take 80 mg by mouth daily., Disp: , Rfl:    carvedilol  (COREG ) 3.125 MG tablet, Take 1 tablet (3.125 mg total) by mouth 2 (two) times daily., Disp: 180 tablet, Rfl: 3   clobetasol  cream (TEMOVATE ) 0.05 %, Apply 1 Application topically 2 (two) times daily as needed. For vaginal itching., Disp: 30 g, Rfl: 0   clopidogrel  (PLAVIX ) 75 MG tablet, Take 1 tablet (75 mg total) by mouth daily., Disp: 90 tablet, Rfl: 3   Continuous Glucose Receiver (FREESTYLE LIBRE 2 READER) DEVI, Use with sensor to check blood sugars 6 times daily., Disp: 1 each, Rfl: 0   Continuous Glucose Sensor (FREESTYLE LIBRE 2 SENSOR) MISC, USE ONE SENSOR  EVERY 14 DAYS TO CHECK BLOOD SUGAR SIX TIMES PER DAY, Disp: 6 each, Rfl: 1   DULoxetine  (CYMBALTA ) 60 MG capsule, Take 60 mg by mouth daily., Disp: , Rfl:    ezetimibe  (ZETIA ) 10 MG tablet, Take 1 tablet (10 mg total) by mouth daily. For cholesterol., Disp: 90 tablet, Rfl: 3   famotidine  (PEPCID ) 20 MG tablet, Take 1 tablet (20 mg total) by mouth at bedtime. For nausea and acid, Disp: 90 tablet, Rfl: 0   furosemide  (LASIX ) 20 MG tablet, Take 1 tablet (20 mg total) by mouth as needed (As needed for weight gain greater than 3 pounds overnight or increased shortness of breath.). Take one tab only on Monday, Wednesday, & Fridays, Disp: 90 tablet, Rfl: 3   gabapentin  (NEURONTIN ) 300 MG capsule, TAKE 1 CAPSULE BY MOUTH TWICE DAILY FOR PAIN, Disp: 180 capsule, Rfl: 3   Glucagon  (GVOKE HYPOPEN  2-PACK) 1 MG/0.2ML SOAJ, Inject 1 mg (1 pen) as needed for severe low blood sugar (sustained glucose less than 55 despite oral glucose treatments). May repeat in 15 minutes as needed., Disp: 0.4 mL, Rfl: 1   insulin  aspart (NOVOLOG  FLEXPEN) 100 UNIT/ML FlexPen, INJECT 6-16 UNITS  THREE TIMES DAILY WITH MEALS PER  SLIDING  SCALE  FOR  DIABETES, Disp: 45 mL, Rfl: 0   Insulin  Pen Needle (INSUPEN PEN NEEDLES) 32G X 4 MM MISC, Use to inject insulin  4 times daily., Disp: 250 each, Rfl: 1   isosorbide  mononitrate (IMDUR ) 30 MG 24 hr tablet, Take 30 mg by mouth daily., Disp: , Rfl:    levothyroxine  (SYNTHROID ) 100 MCG tablet, TAKE 1 TABLET BY MOUTH IN THE MORNING ON  AN  EMPTY  STOMACH  WITH  WATER   ONLY.  NO  FOOD  OR  OTHER  MEDICATIONS  FOR  30  MINUTES, Disp: 90 tablet, Rfl: 1   mirtazapine  (REMERON ) 15 MG tablet, Take 1 tablet (15 mg total) by mouth at bedtime. For sleep and depression, Disp: 90 tablet, Rfl: 3   omeprazole  (PRILOSEC) 40 MG capsule, Take 1 capsule (40 mg total) by mouth daily., Disp: 90 capsule, Rfl: 3   ondansetron  (ZOFRAN ) 4 MG tablet, Take 1 tablet (4 mg total) by mouth every 6 (six) hours as needed for  nausea., Disp: 15 tablet, Rfl: 0   ondansetron  (ZOFRAN -ODT) 4 MG disintegrating tablet, Take 1 tablet (4 mg total) by mouth every 8 (eight) hours as needed for nausea or vomiting., Disp: 20 tablet, Rfl: 0   potassium chloride  (KLOR-CON  M) 10 MEQ tablet, Only take on days you take your lasix . Take 1 tablet (10 mEq total) by mouth for 1 dose on those days you take your lasix  20mg  pill., Disp:  90 tablet, Rfl: 3   sitaGLIPtin  (JANUVIA ) 100 MG tablet, Take 1 tablet (100 mg total) by mouth daily. for diabetes., Disp: 90 tablet, Rfl: 1   umeclidinium-vilanterol (ANORO ELLIPTA ) 62.5-25 MCG/ACT AEPB, Inhale 1 puff into the lungs daily., Disp: 60 each, Rfl: 5  Social History: Social History   Tobacco Use   Smoking status: Every Day    Current packs/day: 1.00    Average packs/day: 1 pack/day for 45.0 years (45.0 ttl pk-yrs)    Types: Cigarettes   Smokeless tobacco: Never   Tobacco comments:    Has cut back, trying to quit.   Vaping Use   Vaping status: Never Used  Substance Use Topics   Alcohol use: No    Alcohol/week: 0.0 standard drinks of alcohol   Drug use: Yes    Types: Marijuana    Comment: last noc    Family Medical History: Family History  Problem Relation Age of Onset   Heart attack Mother        First MI @ 25 - Died @ 30   Heart disease Mother    Heart disease Father        Died @ 57   Throat cancer Brother    Liver cancer Brother    Colon cancer Sister     Physical Examination: Vitals:   08/31/23 1001  BP: 134/80    General: Patient is in no apparent distress. Attention to examination is appropriate.  Neck:   Supple.  Full range of motion.  Respiratory: Patient is breathing without any difficulty.   NEUROLOGICAL:     Awake, alert, oriented to person, place, and time.  Speech is clear and fluent.   Cranial Nerves: Pupils equal round and reactive to light.  Facial tone is symmetric.  Facial sensation is symmetric. Shoulder shrug is symmetric. Tongue protrusion  is midline.    Strength:  Side Iliopsoas Quads Hamstring PF DF EHL  R 5 5 5 5 5 5   L 5 5 5 5 5 5    Reflexes are 1+ and symmetric at the biceps, triceps, brachioradialis, patella and achilles.   Hoffman's is absent. Clonus is absent  Bilateral upper and lower extremity sensation decreased throughout the bilateral lower extremities distal to the knee     No evidence of dysmetria noted.  Gait is normal.    Imaging: Narrative & Impression  CLINICAL DATA:  Osteoarthritis. Mid back pain. Question thoracic canal stenosis.   EXAM: MRI THORACIC SPINE WITHOUT CONTRAST   TECHNIQUE: Multiplanar, multisequence MR imaging of the thoracic spine was performed. No intravenous contrast was administered.   COMPARISON:  MRI of the lumbar spine and MRI of the cervical spine 02/06/2023. CT chest 09/03/2022.   FINDINGS: Alignment: No significant listhesis is present. Mild straightening of the normal thoracic kyphosis and lumbar lordosis is noted.   Vertebrae: Extensive type 2 Modic changes are present throughout the T12 vertebral body. Diffuse fatty replacement is also present at L1 and L2. Marrow signal and vertebral body heights are otherwise normal.   Cord: Normal signal and morphology. The conus medullaris terminates at L1-2, within normal limits.   Paraspinal and other soft tissues: The paraspinous soft tissues are unremarkable. The visualized lung fields are clear. Simple cysts are present within the left kidney, measuring up to 3.3 cm at the upper pole. No solid lesions are present. No adenopathy is present.   Disc levels:   Minimal disc bulging is present T1-2 T10-11 without significant central canal stenosis stenosis. Asymmetric right-sided  facet hypertrophy contributes to mild right foraminal narrowing at T10-11.   Slightly more prominent disc bulging and mild bilateral facet hypertrophy is present at T11-12 without significant stenosis.   A broad-based disc protrusion is  again noted at T12-L1. Moderate left and mild right foraminal stenosis is stable.   Additional disc disease at L1-2 and L2-3 is stable from the previous lumbar spine MRI. Scout images demonstrate multilevel disc disease in cervical spine, described on the recent cervical spine MRI.   IMPRESSION: 1. Stable broad-based disc protrusion at T12-L1 with moderate left and mild right foraminal stenosis. 2. Mild right foraminal narrowing at T10-11 secondary to asymmetric right-sided facet hypertrophy. 3. No other significant stenosis in the thoracic spine. 4. Minimal disc bulging at T1-2 and T10-11 without significant central canal stenosis. 5. Additional disc disease at L1-2 and L2-3 is stable from the previous lumbar spine MRI. 6. Scout images demonstrate multilevel disc disease in cervical spine, described on the recent cervical spine MRI.     Electronically Signed   By: Audree Leas M.D.   On: 06/05/2023 10:29   Narrative & Impression  CLINICAL DATA:  78 year old female with persistent low back pain. Prior surgery.   EXAM: MRI LUMBAR SPINE WITHOUT CONTRAST   TECHNIQUE: Multiplanar, multisequence MR imaging of the lumbar spine was performed. No intravenous contrast was administered.   COMPARISON:  Lumbar radiographs 04/26/2016. Previous MRI 10/14/2012.   FINDINGS: Segmentation: Normal on the comparison radiographs, designating previous L3 through S1 fusion.   Alignment: Degenerative retrolisthesis of L2 on L3 has progressed, up to 5 mm now. Superimposed straightening of lumbar lordosis otherwise stable.   Vertebrae: Chronic fusion hardware susceptibility artifact L3 through S1. Substantially progressed thoracolumbar junction and L1-L2 disc and endplate degeneration from prior exams, with confluent marrow edema now at T12-L1 superimposed on chronic degenerative endplate marrow signal changes there. Background bone marrow signal remains normal. Intact visible sacrum  and SI joints. 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   Conus medullaris and cauda equina: Conus extends to the L1 level. No lower spinal cord or conus signal abnormality. Generally normal cauda equina nerve roots.   Paraspinal and other soft tissues: Negative visible abdominal viscera; chronic benign left renal cysts (no follow-up imaging recommended). Postoperative changes to the lower lumbar paraspinal soft tissues with no adverse features.   Disc levels:   T11-T12: Minimal disc degeneration.  No stenosis.   T12-L1: Substantial disc desiccation and disc space loss since 2014. New circumferential disc bulge or protrusion eccentric to the left. Partially effaced ventral CSF space but no significant spinal stenosis at this time (series 9, image 7). Mild to moderate left and mild right T12 foraminal stenosis is new from the previous MRI.   L1-L2: Substantial disc desiccation and disc space loss from priors. Circumferential disc osteophyte complex. Small L1 inferior endplate Schmorl's node. Mild ligament flavum hypertrophy. No significant spinal stenosis. Mild new bilateral L1 neural foraminal stenosis.   L2-L3: Chronic but progressed disc space loss. Progressed retrolisthesis. Progressed circumferential disc osteophyte complex. Mild facet and ligament flavum hypertrophy. Mild to moderate spinal stenosis (series 9, image 8 and series 12, image 16) is similar to the 2014 MRI. Mild to moderate bilateral L2 neural foraminal stenosis, progressed on the right.   L3-L4:  Chronic decompression and fusion.  No stenosis.   L4-L5:  Chronic decompression and fusion.  No stenosis.   L5-S1: Chronic decompression and fusion. Mild osseous left L5 foraminal stenosis is stable.  No other adverse features.   IMPRESSION: 1. Chronic L3 through S1 fusion is stable with no adverse features. 2. Progressed adjacent segment disease at L2-L3 where  retrolisthesis and bulky disc osteophyte complex combine for up to moderate spinal and bilateral foraminal stenosis. Mild 3. And moderate to severe chronic disc and endplate degeneration is new T12-L1 and L1-L2. Associated degenerative marrow edema at T12-L1. No significant spinal stenosis at either level but new mild to moderate left T12 and bilateral L1 neural foraminal stenosis.     Electronically Signed   By: Marlise Simpers M.D.   On: 02/16/2023 04:06     Narrative & Impression  CLINICAL DATA:  Initial evaluation for chronic neck pain, cervical radiculopathy.   EXAM: MRI CERVICAL SPINE WITHOUT CONTRAST   TECHNIQUE: Multiplanar, multisequence MR imaging of the cervical spine was performed. No intravenous contrast was administered.   COMPARISON:  Prior radiograph from 01/19/2018 as well as previous MRI from 07/09/2012.   FINDINGS: Alignment: Mild straightening of the normal cervical lordosis. Trace degenerative anterolisthesis of C3 on C4.   Vertebrae: Vertebral body height maintained without acute or chronic fracture. Bone marrow signal intensity within normal limits. No discrete or worrisome osseous lesions. Minor reactive endplate changes present about the C4-5 through C7-T1 interspaces. Mild reactive marrow edema present about the right C3-4 facet due to facet arthritis (series 7, image 2). No other abnormal marrow edema.   Cord: Normal signal and morphology.   Posterior Fossa, vertebral arteries, paraspinal tissues: Unremarkable.   Disc levels:   C2-C3: Small central disc protrusion mildly indents the ventral thecal sac. Moderate right-sided facet arthrosis. No canal or foraminal stenosis.   C3-C4: Small right paracentral disc protrusion with annular fissure mildly indents the ventral thecal sac (series 9, image 11). Moderate right-sided facet arthrosis. Mild uncovertebral spurring. No more than mild spinal stenosis. Mild right C4 foraminal narrowing. Left neural  foramina remains patent.   C4-C5: Degenerative intervertebral disc space narrowing with circumferential disc osteophyte complex. Flattening of the ventral thecal sac with no more than mild spinal stenosis. Severe right worse than left C5 foraminal narrowing.   C5-C6: General vertebral disc space narrowing. Right paracentral disc protrusion with annular fissure indents the ventral thecal sac (series 8, image 19). Right worse than left facet arthrosis. Mild spinal stenosis. Moderate left C6 foraminal narrowing. Right neural foramina remains patent.   C6-C7: Degenerative or acute disc space narrowing with circumferential disc osteophyte complex. Mild facet hypertrophy. No spinal stenosis. Mild bilateral C7 foraminal narrowing.   C7-T1: Degenerative vertebral disc space narrowing with diffuse disc bulge and bilateral uncovertebral spurring. Superimposed small right foraminal disc protrusion (series 8, image 25). Mild right greater left facet hypertrophy. No spinal stenosis. Mild right C8 foraminal narrowing. Left neural foramen remains patent.   IMPRESSION: 1. Multilevel cervical spondylosis with resultant mild diffuse spinal stenosis at C3-4 through C5-6. 2. Multifactorial degenerative changes with resultant multilevel foraminal narrowing as above. Notable findings include severe right worse than left C5 foraminal stenosis, with moderate left C6 foraminal narrowing. 3. Small right foraminal disc protrusion at C7-T1, potentially affecting the exiting right C8 nerve root. 4. Reactive marrow edema about the right C3-4 facet due to facet arthritis. Finding could serve as a source for neck pain and referred symptoms.     Electronically Signed   By: Virgia Griffins M.D.   On: 02/12/2023 19:00   I have personally reviewed the images and agree with the above interpretation with the exceptions or additions below.  Independently reviewed the thoracic MRI to evaluate for spacing  given the plan for thoracic spinal cord stimulator placement.  She appears to have adequate spacing required for placement of a paddle lead.  Medical Decision Making/Assessment and Plan: Ms. Santellano is a pleasant 78 y.o. 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  She has had 100% improvement with her neuropathic pain and lower extremity pain and including improvement in her back pain with a trial stimulator.  She has had her psychotherapy evaluation as well as had adequate imaging.  Her thoracic spine appears to have good room for placement of a paddle.  Will plan to have her undergo a thoracic spinal cord stimulator placement with an IPG implant.  We discussed the risks and benefits of surgery including bleeding, infection, wound failure, failure to resolve her symptoms as well as neurologic injury.  She would like to go forward with surgery.  We ask for medical and cardiac clearance.  We let her know the risks involved in surgery with continued smoking and she is plans to quit smoking to help with her wound healing.   Thank you for involving me in the care of this patient.    Carroll Clamp MD/MSCR Neurosurgery

## 2023-08-31 ENCOUNTER — Ambulatory Visit (INDEPENDENT_AMBULATORY_CARE_PROVIDER_SITE_OTHER): Payer: Medicare PPO | Admitting: Neurosurgery

## 2023-08-31 ENCOUNTER — Encounter: Payer: Self-pay | Admitting: Neurosurgery

## 2023-08-31 ENCOUNTER — Other Ambulatory Visit: Payer: Self-pay

## 2023-08-31 VITALS — BP 134/80 | Ht 66.0 in | Wt 128.4 lb

## 2023-08-31 DIAGNOSIS — E1140 Type 2 diabetes mellitus with diabetic neuropathy, unspecified: Secondary | ICD-10-CM

## 2023-08-31 DIAGNOSIS — M5416 Radiculopathy, lumbar region: Secondary | ICD-10-CM

## 2023-08-31 DIAGNOSIS — M5441 Lumbago with sciatica, right side: Secondary | ICD-10-CM

## 2023-08-31 DIAGNOSIS — M961 Postlaminectomy syndrome, not elsewhere classified: Secondary | ICD-10-CM | POA: Insufficient documentation

## 2023-08-31 DIAGNOSIS — M5442 Lumbago with sciatica, left side: Secondary | ICD-10-CM | POA: Diagnosis not present

## 2023-08-31 DIAGNOSIS — G8929 Other chronic pain: Secondary | ICD-10-CM

## 2023-08-31 DIAGNOSIS — Z01818 Encounter for other preprocedural examination: Secondary | ICD-10-CM

## 2023-08-31 DIAGNOSIS — Z981 Arthrodesis status: Secondary | ICD-10-CM | POA: Diagnosis not present

## 2023-08-31 DIAGNOSIS — G629 Polyneuropathy, unspecified: Secondary | ICD-10-CM

## 2023-08-31 NOTE — Patient Instructions (Signed)
 Please see below for information in regards to your upcoming surgery:   Planned surgery: thoracic laminectomy for spinal cord stimulator placement [facility])   Surgery date: 09/22/23 at [facility] (Medical Mall: [address] [place], [place] [address]) - you will find out your arrival time the business day before your surgery.   Pre-op appointment at [facility] Pre-admit Testing: we will call you with a date/time for this. If you are scheduled for an in person appointment, Pre-admit Testing is located on the first floor of the [facility] building, [address], [address]. Please bring all prescriptions in the original prescription bottles to your appointment. During this appointment, they will advise you which medications you can take the morning of surgery, and which medications you will need to hold for surgery. Labs (such as blood work, EKG) may be done at your pre-op appointment. You are not required to fast for these labs. Should you need to change your pre-op appointment, please call Pre-admit testing at [phone].      Blood thinners:   Plavix :     Stop Plavix  7 days prior, resume Plavix  14 days after      Surgical clearance: we will send a clearance form to Tretha Fu, NP & Dr Jerelene Monday. They may wish to see you in their office prior to signing the clearance form. If so, they may call you to schedule an appointment.      Common restrictions after surgery: No bending, lifting, or twisting ("BLT"). Avoid lifting objects heavier than 10 pounds for the first 6 weeks after surgery. Where possible, avoid household activities that involve lifting, bending, reaching, pushing, or pulling such as laundry, vacuuming, grocery shopping, and childcare. Try to arrange for help from friends and family for these activities while you heal. Do not drive while taking prescription pain medication. Weeks 6 through 12 after surgery: avoid lifting  more than 25 pounds.     How to contact us :  If you have any questions/concerns before or after surgery, you can reach us  at [phone], or you can send a mychart message. We can be reached by phone or mychart 8am-4pm, Monday-Friday.  *Please note: Calls after 4pm are forwarded to a third party answering service. Mychart messages are not routinely monitored during evenings, weekends, and holidays. Please call our office to contact the answering service for urgent concerns during non-business hours.     If you have FMLA/disability paperwork, please drop it off or fax it to [phone], attention Patty.   Appointments/FMLA & disability paperwork: Gerlean Kocher, & Maryann Smalls Registered Nurse/Surgery scheduler: Lavelle Posey Medical Assistants: Donnajean Fuse Physician Assistants: Ludwig Safer, PA-C, Anastacio Karvonen, PA-C & Lucetta Russel, PA-C Surgeons: Jodeen Munch, MD & Henderson Lock, MD

## 2023-09-01 ENCOUNTER — Telehealth: Payer: Self-pay | Admitting: Cardiovascular Disease

## 2023-09-01 NOTE — Telephone Encounter (Signed)
   Pre-operative Risk Assessment    Patient Name: Sarah Payne  DOB: February 23, 1946 MRN: [id number]   Date of last office visit: 08/28/22 Date of next office visit: n/a   Request for Surgical Clearance    Procedure:  Thoracic Laminectomy for spinal cord stimulator placement   Date of Surgery:  Clearance 09/22/23                                Surgeon:  Ernestine Mcmurray, MD Surgeon's Group or Practice Name:  [facility] Phone number:  [phone] Fax number:  [phone]   Type of Clearance Requested:  Hold Plavix 7 days prior, Resume Plavix 14 days after    Type of Anesthesia:  General    Additional requests/questions:    Signed, Narda Amber   09/01/2023, 8:06 AM

## 2023-09-01 NOTE — Telephone Encounter (Signed)
I have confirmed with requesting office they will need both cardiac and pharm clearance.

## 2023-09-01 NOTE — Telephone Encounter (Signed)
I s/w the office and I have been informed that they are going to reach out to the anesthesiologist if pharm clearance alone is acceptable or if needing medical/cardiac as well. Their office will call back with an update.

## 2023-09-03 ENCOUNTER — Encounter: Payer: Self-pay | Admitting: Primary Care

## 2023-09-03 ENCOUNTER — Telehealth: Payer: Self-pay

## 2023-09-03 ENCOUNTER — Ambulatory Visit (INDEPENDENT_AMBULATORY_CARE_PROVIDER_SITE_OTHER): Payer: Medicare PPO | Admitting: Primary Care

## 2023-09-03 VITALS — BP 148/80 | HR 79 | Temp 97.3°F | Ht 66.0 in | Wt 126.0 lb

## 2023-09-03 DIAGNOSIS — F4323 Adjustment disorder with mixed anxiety and depressed mood: Secondary | ICD-10-CM

## 2023-09-03 DIAGNOSIS — E039 Hypothyroidism, unspecified: Secondary | ICD-10-CM | POA: Diagnosis not present

## 2023-09-03 DIAGNOSIS — Z794 Long term (current) use of insulin: Secondary | ICD-10-CM | POA: Diagnosis not present

## 2023-09-03 DIAGNOSIS — E1151 Type 2 diabetes mellitus with diabetic peripheral angiopathy without gangrene: Secondary | ICD-10-CM | POA: Diagnosis not present

## 2023-09-03 DIAGNOSIS — E11649 Type 2 diabetes mellitus with hypoglycemia without coma: Secondary | ICD-10-CM | POA: Diagnosis not present

## 2023-09-03 LAB — POCT GLYCOSYLATED HEMOGLOBIN (HGB A1C): Hemoglobin A1C: 8.5 % — AB (ref 4.0–5.6)

## 2023-09-03 LAB — MICROALBUMIN / CREATININE URINE RATIO
Creatinine,U: 30.2 mg/dL
Microalb Creat Ratio: 259 mg/g — ABNORMAL HIGH (ref 0.0–30.0)
Microalb, Ur: 7.8 mg/dL — ABNORMAL HIGH (ref 0.0–1.9)

## 2023-09-03 LAB — TSH: TSH: 4.89 u[IU]/mL (ref 0.35–5.50)

## 2023-09-03 MED ORDER — SITAGLIPTIN PHOSPHATE 100 MG PO TABS: 100.0000 mg | ORAL_TABLET | Freq: Every day | ORAL | 1 refills | Status: AC

## 2023-09-03 NOTE — Progress Notes (Signed)
Subjective:    Patient ID: Sarah Payne, female    DOB: [birth year], 78 y.o.   MRN: [id number]  HPI  Sarah Payne is a very pleasant 78 y.o. female with a significant medical history including uncontrolled type 2 diabetes, hypoglycemia, hypertension, cardiomyopathy, CAD, CHF, tobacco use, COPD who presents today for follow-up of diabetes. She would also like to discuss anxiety and depression.  She is also due for repeat TSH.  1) Type 2 Diabetes: Current medications include: Januvia 100 mg daily, NovoLog insulin sliding scale. She never started Januvia.   She is injecting 5 units TID with meals. She never began taking   She is checking her blood glucose continuously and is getting readings of:  AM fasting: 140-240 Before lunch: low 100s  Will inject Novolog after eating lunch due to increase glucose levels of 240, then will drop into the 40s.   Bedtime: 110s  Daily hypoglycemia: 40-50s, will drink orange juice and soda to increase glucose levels.  She skips lunch often. Will not inject Novolog when not eating.   Last A1C: 8.0 in November 2024, 8.5 today Last Eye Exam: UTD Last Foot Exam:  UTD Pneumonia Vaccination: 2020 Urine Microalbumin: Due Statin: atorvastatin   Dietary changes since last visit: Potatao chips, french onion dips, Pepsi or orange juice daily, sweet tea.    Exercise: None.   BP Readings from Last 3 Encounters:  09/03/23 (!) 148/80  08/31/23 134/80  08/19/23 (!) 150/92   Wt Readings from Last 3 Encounters:  09/03/23 126 lb (57.2 kg)  08/31/23 128 lb 6.4 oz (58.2 kg)  08/19/23 123 lb (55.8 kg)   2) Anxiety and Depression: Prior history, overall controlled until recently.  About 1 month ago her husband had a PTSD attack and kicked her out of her home.  She lives with her sister for a few weeks until she moved back home.  Her husband is dealing with PTSD from the [place] War, she is unsure how to help him.  Anytime she tries to help he pushes her away.   This has caused a lot of stress for her and strain on their marriage.  She would like to meet with therapy.  Review of Systems  Cardiovascular:  Negative for palpitations.  Gastrointestinal:  Positive for constipation.  Endocrine: Positive for polydipsia. Negative for polyphagia and polyuria.  Neurological:  Positive for dizziness and numbness.         Past Medical History:  Diagnosis Date   Acute pain of left shoulder 01/17/2019   Acute renal failure superimposed on stage 3b chronic kidney disease (HCC) 09/03/2022   Aortic atherosclerosis (HCC)    Bilateral cataracts 01/2015   a.) s/p extraction   CHF (congestive heart failure) (HCC) 09/16/2013   a.) TTE 09/16/2013: EF 55-60%; mod inferior HK, triv TR; G1DD. b.) TTE 01/02/2016: EF 25-30%; mid-apicalateroseptal, lat, inf, inferoseptal, apical akinesis; triv TR; G1DD. c. TTE 04/16/2016: EF 65%; G1DD. d.) TTE 05/02/2020: EF 50-55%; G2DD. e.) TTE 04/11/2021: EF 55%; G1DD.   Chronic low back pain    Clostridioides difficile infection 2015   COPD (chronic obstructive pulmonary disease) (HCC)    Coronary artery disease    a.) PCI 02/06/2011: 100% mLCx - DES x 3. b.) PCI 09/17/2011: 95% dRCA - DES x 1. c.) PCI 10/02/2011: 75% pLCX - DES x 1. d.) PCI 08/19/2012: DES x 2 p-mRCA.  e.) LHC 01/01/16: Takotsubo event 01/01/2016 with patent stents   Depression    Diverticulosis  Frequent PVCs    a. Noted in hospital 12/2015.   GERD (gastroesophageal reflux disease)    History of heart artery stent    a.) TOTAL of 7 stents: a.) 02/06/2011 - overlapping 2.5x12 mm Xience V, 2.5x8 mm Xience V, 2.25x8 mm Xience Nano to mLCx. b.) 09/17/2011 - 2.5x23 Xience V dRCA. c.) 10/02/2011 - 2.5x15 mm Xience V pLCx. d.) 08/09/2012 - overlapping 3.0x66mm mRCA and 3.5x19mm pRCA   Hyperlipidemia    Hypertension    Hypothyroidism    Impingement syndrome of left shoulder    LBBB (left bundle branch block)    Left flank pain 10/28/2022   Long term current use of  antithrombotics/antiplatelets    a.) clopidogrel   Marijuana abuse    Myocardial infarction [facility])    a.) multiple MIs;  5 per her report   OSA (obstructive sleep apnea)    a.) mild; does not require nocturnal PAP therapy   Pancreatitis 09/19/2022   Paroxysmal SVT (supraventricular tachycardia) (HCC) 05/08/2021   a.)  Zio patch 05/08/2021: 3 distinct SVT runs; fastest 5 beats at a rate of 146 bpm; longest 7 beats at rate of 134 bpm   T2DM (type 2 diabetes mellitus) (HCC)    Takotsubo cardiomyopathy    a. 12/2015 - nephew committed suicide 1 week prior, sister died the morning of presentation - initially called a STEMI; cath with patent stents. LVEF 25-30%.   Tendonitis of left rotator cuff    Tobacco abuse    Vaginal burning 03/22/2019   Vascular dementia     Social History   Socioeconomic History   Marital status: Married    Spouse name: Not on file   Number of children: Not on file   Years of education: Not on file   Highest education level: Not on file  Occupational History   Not on file  Tobacco Use   Smoking status: Every Day    Current packs/day: 1.00    Average packs/day: 1 pack/day for 45.0 years (45.0 ttl pk-yrs)    Types: Cigarettes   Smokeless tobacco: Never   Tobacco comments:    Has cut back, trying to quit.   Vaping Use   Vaping status: Never Used  Substance and Sexual Activity   Alcohol use: No    Alcohol/week: 0.0 standard drinks of alcohol   Drug use: Yes    Types: Marijuana    Comment: last noc   Sexual activity: Not on file  Other Topics Concern   Not on file  Social History Narrative   Lives at home with her husband in [place].  Previously used marijuana - quit.      Regular exercise: no/ pain from a frozen rotator cuff   Caffeine use: coffee daily and pepsi      Does not have a living will.   Daughters and husband know her wishes- would desire CPR but not prolonged life support if futile   Social Drivers of Corporate investment banker  Strain: Low Risk  (08/06/2022)   Overall Financial Resource Strain (CARDIA)    Difficulty of Paying Living Expenses: Not hard at all  Food Insecurity: No Food Insecurity (11/04/2022)   Hunger Vital Sign    Worried About Running Out of Food in the Last Year: Never true    Ran Out of Food in the Last Year: Never true  Transportation Needs: No Transportation Needs (11/04/2022)   PRAPARE - Administrator, Civil Service (Medical): No  Lack of Transportation (Non-Medical): No  Physical Activity: Inactive (08/06/2022)   Exercise Vital Sign    Days of Exercise per Week: 0 days    Minutes of Exercise per Session: 0 min  Stress: No Stress Concern Present (08/06/2022)   [employer] of Occupational Health - Occupational Stress Questionnaire    Feeling of Stress : Only a little  Social Connections: Socially Isolated (08/06/2022)   Social Connection and Isolation Panel [NHANES]    Frequency of Communication with Friends and Family: Never    Frequency of Social Gatherings with Friends and Family: Never    Attends Religious Services: Never    Database administrator or Organizations: No    Attends Banker Meetings: Never    Marital Status: Married  Catering manager Violence: Not At Risk (09/04/2022)   Humiliation, Afraid, Rape, and Kick questionnaire    Fear of Current or Ex-Partner: No    Emotionally Abused: No    Physically Abused: No    Sexually Abused: No    Past Surgical History:  Procedure Laterality Date   ABDOMINAL AORTOGRAM W/LOWER EXTREMITY N/A 09/12/2020   Procedure: ABDOMINAL AORTOGRAM W/LOWER EXTREMITY;  Surgeon: Iran Ouch, MD;  Location: [facility] INVASIVE CV LAB;  Service: Cardiovascular;  Laterality: N/A;   ABDOMINAL HYSTERECTOMY     APPENDECTOMY     BACK SURGERY     CARDIAC CATHETERIZATION N/A 01/01/2016   Procedure: Left Heart Cath and Coronary Angiography;  Surgeon: Corky Crafts, MD;  Location: [facility] INVASIVE CV LAB;  Service: Cardiovascular;   Laterality: N/A;   CATARACT EXTRACTION W/PHACO Left 01/30/2015   Procedure: CATARACT EXTRACTION PHACO AND INTRAOCULAR LENS PLACEMENT (IOC);  Surgeon: Galen Manila, MD;  Location: [facility] ORS;  Service: Ophthalmology;  Laterality: Left;  [place] 00:47    CATARACT EXTRACTION W/PHACO Right 02/13/2015   Procedure: CATARACT EXTRACTION PHACO AND INTRAOCULAR LENS PLACEMENT (IOC);  Surgeon: Galen Manila, MD;  Location: [facility] ORS;  Service: Ophthalmology;  Laterality: Right;  cassette lot # [id number] H [place]  00:29.9 AP  20.7 CDE  6.20   COLONOSCOPY N/A 11/02/2014   Procedure: COLONOSCOPY;  Surgeon: Louis Meckel, MD;  Location: [facility] ENDOSCOPY;  Service: Endoscopy;  Laterality: N/A;   CORONARY ANGIOPLASTY WITH STENT PLACEMENT Left 02/06/2011   Procedure: CORONARY ANGIOPLASTY WITH STENT PLACEMENT; Location: [facility]; Surgeon: Rudean Hitt, MD   CORONARY ANGIOPLASTY WITH STENT PLACEMENT Left 09/17/2011   Procedure: CORONARY ANGIOPLASTY WITH STENT PLACEMENT; Location: [facility]; Surgeon: Rudean Hitt, MD   CORONARY ANGIOPLASTY WITH STENT PLACEMENT Left 10/02/2011   Procedure: CORONARY ANGIOPLASTY WITH STENT PLACEMENT; Location: [facility]; Surgeon: Rudean Hitt, MD   CORONARY ANGIOPLASTY WITH STENT PLACEMENT Left 08/19/2012   Procedure: CORONARY ANGIOPLASTY WITH STENT PLACEMENT; Location: [facility]; Surgeon: Lorine Bears, MD   ESOPHAGOGASTRODUODENOSCOPY (EGD) WITH PROPOFOL N/A 09/21/2018   Procedure: ESOPHAGOGASTRODUODENOSCOPY (EGD) WITH PROPOFOL;  Surgeon: Wyline Mood, MD;  Location: [facility] ENDOSCOPY;  Service: Gastroenterology;  Laterality: N/A;   ESOPHAGOGASTRODUODENOSCOPY (EGD) WITH PROPOFOL N/A 10/06/2022   Procedure: ESOPHAGOGASTRODUODENOSCOPY (EGD) WITH PROPOFOL;  Surgeon: Wyline Mood, MD;  Location: [facility] ENDOSCOPY;  Service: Gastroenterology;  Laterality: N/A;   EYE SURGERY     LEFT HEART CATH AND CORONARY ANGIOGRAPHY Left 08/02/2014   Procedure: LEFT HEART CATH AND CORONARY ANGIOGRAPHY; Location: [facility];  Surgeon: Bryan Lemma, MD   PERIPHERAL VASCULAR ATHERECTOMY Left 09/12/2020   Procedure: PERIPHERAL VASCULAR ATHERECTOMY;  Surgeon: Iran Ouch, MD;  Location[facility] INVASIVE CV LAB;  Service: Cardiovascular;  Laterality: Left;   PERIPHERAL VASCULAR BALLOON ANGIOPLASTY  09/12/2020   Procedure: PERIPHERAL VASCULAR BALLOON ANGIOPLASTY;  Surgeon: Iran Ouch, MD;  Location: [facility] INVASIVE CV LAB;  Service: Cardiovascular;;   REVERSE SHOULDER ARTHROPLASTY Left 10/01/2021   Procedure: REVERSE SHOULDER ARTHROPLASTY WITH BICEPS TENODESIS.;  Surgeon: Christena Flake, MD;  Location: [facility] ORS;  Service: Orthopedics;  Laterality: Left;   SHOULDER SURGERY Left 2017    Family History  Problem Relation Age of Onset   Heart attack Mother        First MI @ 43 - Died @ 70   Heart disease Mother    Heart disease Father        Died @ 29   Throat cancer Brother    Liver cancer Brother    Colon cancer Sister     No Known Allergies  Current Outpatient Medications on File Prior to Visit  Medication Sig Dispense Refill   albuterol (VENTOLIN HFA) 108 (90 Base) MCG/ACT inhaler Inhale 2 puffs into the lungs every 6 (six) hours as needed for wheezing or shortness of breath. 8 g 0   atorvastatin (LIPITOR) 80 MG tablet Take 80 mg by mouth daily.     carvedilol (COREG) 3.125 MG tablet Take 1 tablet (3.125 mg total) by mouth 2 (two) times daily. 180 tablet 3   clobetasol cream (TEMOVATE) 0.05 % Apply 1 Application topically 2 (two) times daily as needed. For vaginal itching. 30 g 0   clopidogrel (PLAVIX) 75 MG tablet Take 1 tablet (75 mg total) by mouth daily. 90 tablet 3   Continuous Glucose Receiver (FREESTYLE LIBRE 2 READER) DEVI Use with sensor to check blood sugars 6 times daily. 1 each 0   Continuous Glucose Sensor (FREESTYLE LIBRE 2 SENSOR) MISC USE ONE SENSOR EVERY 14 DAYS TO CHECK BLOOD SUGAR SIX TIMES PER DAY 6 each 1   DULoxetine (CYMBALTA) 60 MG capsule Take 60 mg by mouth daily.     ezetimibe  (ZETIA) 10 MG tablet Take 1 tablet (10 mg total) by mouth daily. For cholesterol. 90 tablet 3   famotidine (PEPCID) 20 MG tablet Take 1 tablet (20 mg total) by mouth at bedtime. For nausea and acid 90 tablet 0   furosemide (LASIX) 20 MG tablet Take 1 tablet (20 mg total) by mouth as needed (As needed for weight gain greater than 3 pounds overnight or increased shortness of breath.). Take one tab only on Monday, Wednesday, & Fridays 90 tablet 3   gabapentin (NEURONTIN) 300 MG capsule TAKE 1 CAPSULE BY MOUTH TWICE DAILY FOR PAIN 180 capsule 3   Glucagon (GVOKE HYPOPEN 2-PACK) 1 MG/0.2ML SOAJ Inject 1 mg (1 pen) as needed for severe low blood sugar (sustained glucose less than 55 despite oral glucose treatments). May repeat in 15 minutes as needed. 0.4 mL 1   insulin aspart (NOVOLOG FLEXPEN) 100 UNIT/ML FlexPen INJECT 6-16 UNITS  THREE TIMES DAILY WITH MEALS PER  SLIDING  SCALE  FOR  DIABETES 45 mL 0   Insulin Pen Needle (INSUPEN PEN NEEDLES) 32G X 4 MM MISC Use to inject insulin 4 times daily. 250 each 1   isosorbide mononitrate (IMDUR) 30 MG 24 hr tablet Take 30 mg by mouth daily.     levothyroxine (SYNTHROID) 100 MCG tablet TAKE 1 TABLET BY MOUTH IN THE MORNING ON  AN  EMPTY  STOMACH  WITH  WATER  ONLY.  NO  FOOD  OR  OTHER  MEDICATIONS  FOR  30  MINUTES 90 tablet 1   mirtazapine (REMERON) 15 MG tablet  Take 1 tablet (15 mg total) by mouth at bedtime. For sleep and depression 90 tablet 3   omeprazole (PRILOSEC) 40 MG capsule Take 1 capsule (40 mg total) by mouth daily. 90 capsule 3   ondansetron (ZOFRAN) 4 MG tablet Take 1 tablet (4 mg total) by mouth every 6 (six) hours as needed for nausea. 15 tablet 0   ondansetron (ZOFRAN-ODT) 4 MG disintegrating tablet Take 1 tablet (4 mg total) by mouth every 8 (eight) hours as needed for nausea or vomiting. 20 tablet 0   potassium chloride (KLOR-CON M) 10 MEQ tablet Only take on days you take your lasix. Take 1 tablet (10 mEq total) by mouth for 1 dose on those  days you take your lasix 20mg  pill. 90 tablet 3   umeclidinium-vilanterol (ANORO ELLIPTA) 62.5-25 MCG/ACT AEPB Inhale 1 puff into the lungs daily. 60 each 5   No current facility-administered medications on file prior to visit.    BP (!) 148/80   Pulse 79   Temp (!) 97.3 F (36.3 C) (Temporal)   Ht 5\' 6"  (1.676 m)   Wt 126 lb (57.2 kg)   SpO2 98%   BMI 20.34 kg/m  Objective:   Physical Exam Cardiovascular:     Rate and Rhythm: Normal rate and regular rhythm.  Pulmonary:     Effort: Pulmonary effort is normal.     Breath sounds: Normal breath sounds.  Musculoskeletal:     Cervical back: Neck supple.  Skin:    General: Skin is warm and dry.  Neurological:     Mental Status: She is alert and oriented to person, place, and time.  Psychiatric:        Mood and Affect: Mood normal.           Assessment & Plan:  Diabetes mellitus type 2 with peripheral artery disease (HCC) Assessment & Plan: Uncontrolled with A1c today at 8.5% (previously 8.0% 05/2023) Daily episodes of hypoglycemia   Continue novolog sliding scale TID with meals Instructed to take novolog before meals  Start Januvia 100mg  once daily  Peripheral neuropathy controlled on gabapentin 300mg  BID   Microalbumin/creatinine ration, urine - results pending  Follow up in 3 months   Orders: -     POCT glycosylated hemoglobin (Hb A1C) -     Microalbumin / creatinine urine ratio -     SITagliptin Phosphate; Take 1 tablet (100 mg total) by mouth daily. for diabetes.  Dispense: 90 tablet; Refill: 1  Hypothyroidism, unspecified type Assessment & Plan: TSH 0.32 (05/2023) Continue levothyroxine 100 mcg 6 days weekly and once weekly  Recheck TSH today - results pending  I evaluated patient, was consulted regarding treatment, and agree with assessment and plan per Julaine Fusi, MSN, FNP student.   Sarah Reel, NP-C   Orders: -     TSH  Adjustment disorder with mixed anxiety and depressed  mood Assessment & Plan: Depressed and anxious today related to spouse PTSD  Interested in therapy/counseling  Referral to psychology ordered  I evaluated patient, was consulted regarding treatment, and agree with assessment and plan per Julaine Fusi, MSN, FNP student.   Sarah Reel, NP-C   Orders: -     Ambulatory referral to Psychology  Uncontrolled type 2 diabetes mellitus with hypoglycemia, with long-term current use of insulin (HCC) Assessment & Plan: Remains uncontrolled with A1c of 8.5 today.  We also discussed today, as well as numerous prior visits, that she should be injecting her mealtime insulin before she eats.  She verbalized understanding and will start doing so.  We also discussed how counterproductive it can be if she is treating hypoglycemia with soda, juice, etc.  Goal will be to prevent hypoglycemic episodes.  Again this comes with correct administration of insulin.  Recent prescription for Januvia 100 mg to mail order pharmacy.  Start Januvia 100 mg daily.  Recommended referral to pharmacy for better management and education, she declines.  Follow-up in 3 months.         Doreene Nest, NP

## 2023-09-03 NOTE — Telephone Encounter (Signed)
I have submitted this additional information to [facility].

## 2023-09-03 NOTE — Assessment & Plan Note (Addendum)
Uncontrolled with A1c today at 8.5% (previously 8.0% 05/2023) Daily episodes of hypoglycemia   Continue novolog sliding scale TID with meals Instructed to take novolog before meals  Start Januvia 100mg  once daily  Peripheral neuropathy controlled on gabapentin 300mg  BID   Microalbumin/creatinine ration, urine - results pending  Follow up in 3 months

## 2023-09-03 NOTE — Assessment & Plan Note (Addendum)
Depressed and anxious today related to spouse PTSD  Interested in therapy/counseling  Referral to psychology ordered  I evaluated patient, was consulted regarding treatment, and agree with assessment and plan per Julaine Fusi, MSN, FNP student.   Mayra Reel, NP-C

## 2023-09-03 NOTE — Patient Instructions (Signed)
Medications: -- START januvia (sitagliptin) once daily  -- TAKE novolog insulin before each meal   Labs: -- Please stop by lab on the way out today   Follow up: -- 3 months for diabetes follow up   Referral: -- Psychologist -- you will get a call or message in MyChart about an appointment    MyChart username: [id number]  Password: [id number]

## 2023-09-03 NOTE — Telephone Encounter (Signed)
I received the following message from [facility]:  "Pending: Missing information (clinical) Tracking #[id number]  Note:  Authorization pended for missing information. Please provide documentation with patient's name and date of birth for the following:   - Please provide documentation of improved function from the SCS Trial (e.g. Documentation that patient had improvement in ADLs, walking, cooking, able to perform household chores, gardening, sleep etc.)"

## 2023-09-03 NOTE — Progress Notes (Signed)
Established Patient Office Visit  Subjective   Patient ID: Sarah Payne, female    DOB: [birth year]  Age: 78 y.o. MRN: [id number]  Chief Complaint  Patient presents with   Medical Management of Chronic Issues    3 mo DM f/u    HPI Sarah Payne is a 78 year-old female with history of type 2 diabetes mellitus, HTN, CHF, CAD, cardiomyopathy, COPD, hypothyroidism, diabetic neuropathy, chronic pain syndrome, mixed hyperlipidemia, tobacco abuse, depression who presents today for 3 month diabetes follow up.   Type 2 diabetes mellitus   Current medications: Januvia 100mg  daily, Novolog sliding scale 6-16units TID with meals  She current takes: Novolog 5units TID with/after meals Januvia - never started   She has a CGM and checks sugars regularly with readings of: AM fasting: 140s (increases to 240s with coffee) Before lunch: 110s (increases to 240s, drops to 40s/50s) Bedtime: 110s  She has daily hypoglycemia episodes - blood sugars in 40s/50s  For treatment of this: Pepsi or OJ  >> sugars increase to 110-120s  Diet currently consists of:  Breakfast: coffee with half/half and splenda Lunch: skips almost every day, occasional hamburger Dinner: meat and vegetables (pintos, stewed potatoes, peas, beans, okra) Snacks: none, potato chips/french onion dip Desserts: Pepsi (if PM blood sugar low) Beverages: sweet tea mostly, rare water intake  Exercise: no regular exercise   She has a self-reported 25 lb unintentional weight loss over several months   2. Hypothyroidism   Last TSH 05/2023 - 0.32 Levothyroxine dose decreased to once weekly  3. Anxiety and Depression   Expressed concerns about husband's PTSD. Spouse recently asked her to leave their home for a period of time and she stayed with daughter for 2 weeks. Since returning, she has been unable to communicate with spouse about PTSD and she reports he is uninterested in seeking help. She is requesting personal  therapy/counseling to help her work through this.   Last A1c: 05/2023 -- 8.0% Current A1c: 09/03/2023 -- 8.5% Last foot exam: 05/2023 Last microalbumin/creatinine ratio (urine) 07/2022 - due today Pneumonia vaccine: up to date Influenza: up to date Statin: atorvastatin 80mg  daily  Wt Readings from Last 3 Encounters:  09/03/23 57.2 kg  08/31/23 58.2 kg  08/19/23 55.8 kg    BP Readings from Last 3 Encounters:  09/03/23 (!) 148/80  08/31/23 134/80  08/19/23 (!) 150/92       Review of Systems  Constitutional:  Positive for malaise/fatigue and weight loss.       25lbs unintentional weight loss, chronic fatigue that is not worse  Eyes:  Negative for blurred vision, double vision and photophobia.  Cardiovascular:  Negative for chest pain and palpitations.  Gastrointestinal:  Positive for constipation. Negative for nausea and vomiting.  Genitourinary:  Positive for frequency. Negative for dysuria and urgency.       Frequency unchanged  Neurological:  Positive for dizziness. Negative for tingling and sensory change.  Endo/Heme/Allergies:  Positive for polydipsia.  Psychiatric/Behavioral:  Positive for depression. The patient is nervous/anxious.       Objective:     BP (!) 148/80   Pulse 79   Temp (!) 97.3 F (36.3 C) (Temporal)   Ht 5\' 6"  (1.676 m)   Wt 57.2 kg   SpO2 98%   BMI 20.34 kg/m    Physical Exam Constitutional:      General: She is not in acute distress.    Appearance: Normal appearance.  Cardiovascular:     Rate and Rhythm:  Normal rate and regular rhythm.     Pulses: Normal pulses.     Heart sounds: Normal heart sounds.  Pulmonary:     Effort: Pulmonary effort is normal.     Breath sounds: Normal breath sounds.  Skin:    General: Skin is warm and dry.  Neurological:     General: No focal deficit present.     Mental Status: She is alert and oriented to person, place, and time.  Psychiatric:        Behavior: Behavior normal.     Comments: Depressed,  anxious      Results for orders placed or performed in visit on 09/03/23  POCT glycosylated hemoglobin (Hb A1C)  Result Value Ref Range   Hemoglobin A1C 8.5 (A) 4.0 - 5.6 %   HbA1c POC (<> result, manual entry)     HbA1c, POC (prediabetic range)     HbA1c, POC (controlled diabetic range)        The 10-year ASCVD risk score (Arnett DK, et al., 2019) is: 58.8%    Assessment & Plan:   Problem List Items Addressed This Visit       Cardiovascular and Mediastinum   Diabetes mellitus type 2 with peripheral artery disease (HCC) - Primary   Uncontrolled with A1c today at 8.5% (previously 8.0% 05/2023) Daily episodes of hypoglycemia   Continue novolog sliding scale TID with meals Instructed to take novolog before meals  Start Januvia 100mg  once daily  Peripheral neuropathy controlled on gabapentin 300mg  BID   Microalbumin/creatinine ration, urine - results pending  Follow up in 3 months       Relevant Medications   sitaGLIPtin (JANUVIA) 100 MG tablet   Other Relevant Orders   POCT glycosylated hemoglobin (Hb A1C) (Completed)   Microalbumin/Creatinine Ratio, Urine     Endocrine   Hypothyroidism   TSH 0.32 (05/2023) On levothyroxine once weekly  Recheck TSH today - results pending      Relevant Orders   TSH     Other   Adjustment disorder with mixed anxiety and depressed mood   Depressed and anxious today related to spouse PTSD  Interested in therapy/counseling  Referral to psychology ordered      Relevant Orders   Ambulatory referral to Psychology    Disposition: Follow up in 3 months for diabetes check     Lindell Spar, RN

## 2023-09-03 NOTE — Assessment & Plan Note (Addendum)
TSH 0.32 (05/2023) Continue levothyroxine 100 mcg 6 days weekly and once weekly  Recheck TSH today - results pending  I evaluated patient, was consulted regarding treatment, and agree with assessment and plan per Julaine Fusi, MSN, FNP student.   Mayra Reel, NP-C

## 2023-09-03 NOTE — Telephone Encounter (Signed)
As stated in Dr Katrinka Blazing and Dr Garnett Farm previously submitted notes state she received 100% relief from the trial. I spoke with Sarah Payne to discuss specific details about her relief from the trial. She reports that it helped eased the pain in her lower back and tingling in her legs from her neuropathy.  Specifically: It helped her walk further She was able to clean her house, sweep the floors, run the vacuum (which is something she cannot normally do) She was able to sleep better/longer She takes care of her husband, which requires her to get up and down a lot; she was able to do this with more ease She normally would have to stand for 2-3 mins, sit down for a break, then get back up. During the trial, she was able to get a lot more done with less frequent breaks.

## 2023-09-03 NOTE — Assessment & Plan Note (Signed)
Remains uncontrolled with A1c of 8.5 today.  We also discussed today, as well as numerous prior visits, that she should be injecting her mealtime insulin before she eats.  She verbalized understanding and will start doing so.  We also discussed how counterproductive it can be if she is treating hypoglycemia with soda, juice, etc.  Goal will be to prevent hypoglycemic episodes.  Again this comes with correct administration of insulin.  Recent prescription for Januvia 100 mg to mail order pharmacy.  Start Januvia 100 mg daily.  Recommended referral to pharmacy for better management and education, she declines.  Follow-up in 3 months.

## 2023-09-07 NOTE — Telephone Encounter (Signed)
   Name: Sarah Payne  DOB: 02/13/1946  MRN: [id number]  Primary Cardiologist: Julien Nordmann, MD  Chart reviewed as part of pre-operative protocol coverage. Because of [name]'s past medical history and time since last visit, she will require a follow-up in-office visit in order to better assess preoperative cardiovascular risk.  Patient has an office visit scheduled on 09/11/2023 with Dr. Mariah Milling. Appointment notes have been updated to reflect need for pre-op evaluation.    Carlos Levering, NP  09/07/2023, 2:19 PM

## 2023-09-09 ENCOUNTER — Ambulatory Visit: Payer: Self-pay

## 2023-09-09 ENCOUNTER — Other Ambulatory Visit: Payer: Self-pay

## 2023-09-09 DIAGNOSIS — Z01818 Encounter for other preprocedural examination: Secondary | ICD-10-CM

## 2023-09-09 NOTE — Patient Outreach (Unsigned)
  Care Coordination   Follow Up Visit Note   09/10/2023 Name: Sarah Payne MRN: [id number] DOB: [birth year]  Sarah Payne is a 78 y.o. year old female who sees Doreene Nest, NP for primary care. I spoke with  [name] by phone today.  What matters to the patients health and wellness today?  Patient states she is doing better regarding back pain. She states current pain level is 6.  Patient states she did the trial stimulater for her chronic back pain which seemed to really help. She states she is scheduled to have actual stimulater placed on 09/22/23.  Patient states she was having frequent daily low blood sugars in the afternoon and late at night. She states she skips lunch often therefore does not take novolog when not eating. Per chart review patients current Hgb A1c is 8.5.  She reports having a follow up visit with her primary care provider on 09/03/23.  Per primary provider assessment / plan from 2/13 25 visit patient to continue novolog sliding scal TID with meals taking before meals and start taking Januvia as prescribed. Patient states she was informed by her provider that her kidneys were not doing to good.   Patient verbally agreed to referral to practice pharmacist.     Goals Addressed             This Visit's Progress    Management and education regarding health conditions       Interventions Today    Flowsheet Row Most Recent Value  Chronic Disease   Chronic disease during today's visit Diabetes, Other  [back pain]  General Interventions   General Interventions Discussed/Reviewed General Interventions Reviewed, Doctor Visits, Labs  [evaluation of current treatment plan for listed health conditions and patients adherence to plan as established by provider. Assessed for blood sugar reading and pain]  Labs Kidney Function  [discussed patients microalbumin/ creatinine results from 09/03/23]  Doctor Visits Discussed/Reviewed Doctor Visits Reviewed  [name] upcoming  provider visits. Advised to keep follow up visits with providers as recommended.]  Education Interventions   Education Provided Provided Education  [Discussed patients upcoming treatment plan for chronic back pain/ leg tingling.]  Provided Verbal Education On Blood Sugar Monitoring  [Advised frequent blood sugar monitoring. Advised to notify provider for ongoing frequent low blood sugar readings. Reviewed Rule of 15 hypoglycemic treatment.]  Nutrition Interventions   Nutrition Discussed/Reviewed Nutrition Reviewed  [encouraged patient to eat 3 balanced low carbohydrate meals per day / low carbohydrate snack.]  Pharmacy Interventions   Pharmacy Dicussed/Reviewed Pharmacy Topics Reviewed  [medications reviewed and  compliance discussed. Referral to pharmacist regarding patient concerns of medication interaction/ kidney health.]              SDOH assessments and interventions completed:  No     Care Coordination Interventions:  Yes, provided   Follow up plan: Follow up call scheduled for 09/25/23 at 10:15 am    Encounter Outcome:  Patient Visit Completed   [name] RN, BSN, CCM [place]  [facility], Population Health Case Manager Phone: [phone]

## 2023-09-10 ENCOUNTER — Telehealth: Payer: Self-pay

## 2023-09-10 NOTE — Telephone Encounter (Signed)
Pt's most recent A1c on 09/03/23 was 8.5. This has been discussed with Dr Katrinka Blazing. We cannot proceed with elective surgery with an A1c that high.   If she can have 3 weeks of blood sugars consistently under 180, she can go to the lab for a fructosamine test. If the fructosamine test results are appropriate, we can reschedule her surgery.   Left message for pt to return call to discuss

## 2023-09-10 NOTE — Patient Instructions (Signed)
Visit Information  Thank you for taking time to visit with me today. Please don't hesitate to contact me if I can be of assistance to you.   Following are the goals we discussed today:   Goals Addressed             This Visit's Progress    Management and education regarding health conditions       Interventions Today    Flowsheet Row Most Recent Value  Chronic Disease   Chronic disease during today's visit Diabetes, Other  [back pain]  General Interventions   General Interventions Discussed/Reviewed General Interventions Reviewed, Doctor Visits, Labs  [evaluation of current treatment plan for listed health conditions and patients adherence to plan as established by provider. Assessed for blood sugar reading and pain]  Labs Kidney Function  [discussed patients microalbumin/ creatinine results from 09/03/23]  Doctor Visits Discussed/Reviewed Doctor Visits Reviewed  [name] upcoming provider visits. Advised to keep follow up visits with providers as recommended.]  Education Interventions   Education Provided Provided Education  [Discussed patients upcoming treatment plan for chronic back pain/ leg tingling.]  Provided Verbal Education On Blood Sugar Monitoring  [Advised frequent blood sugar monitoring. Advised to notify provider for ongoing frequent low blood sugar readings. Reviewed Rule of 15 hypoglycemic treatment.]  Nutrition Interventions   Nutrition Discussed/Reviewed Nutrition Reviewed  [encouraged patient to eat 3 balanced low carbohydrate meals per day / low carbohydrate snack.]  Pharmacy Interventions   Pharmacy Dicussed/Reviewed Pharmacy Topics Reviewed  [medications reviewed and  compliance discussed. Offered referral to practice pharmacist.  Referral to pharmacist regarding patient concerns of medication interaction/ kidney health.]              Our next appointment is by telephone on 09/25/23 at 10:15 am   Please call the care guide team at [phone] if you need  to cancel or reschedule your appointment.   If you are experiencing a Mental Health or Behavioral Health Crisis or need someone to talk to, please call the Suicide and Crisis Lifeline: 988 call 1-800-273-TALK (toll free, 24 hour hotline)  Patient verbalizes understanding of instructions and care plan provided today and agrees to view in MyChart. Active MyChart status and patient understanding of how to access instructions and care plan via MyChart confirmed with patient.     [name] RN, BSN, CCM [employer], Population Health Case Manager Phone: [phone]

## 2023-09-10 NOTE — Telephone Encounter (Signed)
I spoke with Sarah Payne regarding her A1c and the need for good blood sugar control for surgery. I advised her proceed to the lab for a fructosamine test once she has had 3 weeks of consistent blood sugar control under 180. She verbalized understanding and stated that she will work on this. She is aware that surgery is canceled at this time.

## 2023-09-11 ENCOUNTER — Ambulatory Visit: Payer: Medicare PPO | Attending: Cardiovascular Disease | Admitting: Cardiovascular Disease

## 2023-09-11 ENCOUNTER — Other Ambulatory Visit: Payer: Medicare PPO

## 2023-09-11 ENCOUNTER — Inpatient Hospital Stay: Admission: RE | Admit: 2023-09-11 | Payer: Medicare PPO | Source: Ambulatory Visit

## 2023-09-11 ENCOUNTER — Encounter: Payer: Self-pay | Admitting: Cardiovascular Disease

## 2023-09-11 VITALS — BP 120/72 | HR 71 | Ht 66.5 in | Wt 128.1 lb

## 2023-09-11 DIAGNOSIS — E785 Hyperlipidemia, unspecified: Secondary | ICD-10-CM

## 2023-09-11 DIAGNOSIS — E1151 Type 2 diabetes mellitus with diabetic peripheral angiopathy without gangrene: Secondary | ICD-10-CM

## 2023-09-11 DIAGNOSIS — I2510 Atherosclerotic heart disease of native coronary artery without angina pectoris: Secondary | ICD-10-CM | POA: Diagnosis not present

## 2023-09-11 DIAGNOSIS — I10 Essential (primary) hypertension: Secondary | ICD-10-CM | POA: Diagnosis not present

## 2023-09-11 DIAGNOSIS — Z720 Tobacco use: Secondary | ICD-10-CM | POA: Diagnosis not present

## 2023-09-11 DIAGNOSIS — I779 Disorder of arteries and arterioles, unspecified: Secondary | ICD-10-CM

## 2023-09-11 DIAGNOSIS — I739 Peripheral vascular disease, unspecified: Secondary | ICD-10-CM | POA: Diagnosis not present

## 2023-09-11 DIAGNOSIS — I5032 Chronic diastolic (congestive) heart failure: Secondary | ICD-10-CM

## 2023-09-11 DIAGNOSIS — I5181 Takotsubo syndrome: Secondary | ICD-10-CM

## 2023-09-11 NOTE — Progress Notes (Addendum)
Cardiology Office Note  Date:  09/11/2023   ID:  Sarah Payne, Sarah Payne 01-May-1946, MRN [id number]  PCP:  Doreene Nest, NP   Chief Complaint  Patient presents with   Follow-up    Cardiac clearance for a THORACIC LAMINECTOMY FOR SPINAL CORD STIMULATOR PLACEMENT WITH INTERNAL PULSE GENERATOR IMPLANT; not scheduled yet.  "Doing well."     HPI:  78 year old woman with a long history of  smoking,  coronary artery disease,  prior stent x3 placed to the mid left circumflex with moderate to severe LAD disease  stent placed to her distal RCA in February 2013,  stent to the mid left circumflex in March 2013 ,  poorly controlled diabetes with hemoglobin A1c of 10,  Depression/anxiety hypertension,  cardiac catheterization 08/19/2012 showing moderate mid LAD disease at the takeoff of the diagonal vessel, severe ostial to mid RCA disease, ejection fraction greater than 55%. She had FFR pressure wire showing severe disease of the RCA with drug-eluting stent x2 placed. C. difficile late 2015 after antibiotics Previous history of running out of her medications cardiac catheterization performed showing patent stents 12/2015 She presents today for follow-up of her coronary artery disease,    Last seen in clinic by myself January 2023 Seen in the hospital March 2023 flash pulmonary edema Several medications held for orthostasis  Presenting for preop evaluation for thoracic laminectomy for spinal cord stimulator placement September 22, 2023 Needs to hold Plavix 7 days prior, resume Plavix 14 days later She reports that her spinal cord stimulator placement has been put on hold in the setting of high A1c  Reports that she is working hard with primary care to get her sugars down in fact has been having low sugars at times, has lost 25 pounds over the past year  She did not has any significant chest pain or shortness of breath concerning for angina Denies leg swelling, no PND orthopnea  Significant  stress at home, husband with PTSD  Lab work reviewed A1c 8.5  EKG personally reviewed by myself on todays visit EKG Interpretation Date/Time:  Friday September 11 2023 10:01:31 EST Ventricular Rate:  71 PR Interval:  184 QRS Duration:  114 QT Interval:  392 QTC Calculation: 425 R Axis:   -19  Text Interpretation: Sinus rhythm with occasional Premature ventricular complexes Minimal voltage criteria for LVH, may be normal variant ( Cornell product ) Possible Anterior infarct , age undetermined ST & T wave abnormality, consider lateral ischemia When compared with ECG of 03-Sep-2022 18:20, Premature ventricular complexes are now Present Left bundle branch block is no longer Present Borderline criteria for Anterior infarct are now Present Confirmed by Julien Nordmann [phone]) on 09/11/2023 10:06:05 AM    Prior studies reviewed Last cardiac catheterization June 2017 At that time with patent stents in the left circumflex and RCA  Echocardiogram March 2024 EF 55 to 60%  lower extremity angiography with Dr. Kirke Corin February 2022 1.  No significant aortoiliac disease. 2.  Left lower extremity: Severe calcified disease in the distal SFA with two-vessel runoff below the knee with an occluded anterior tibial artery.  The posterior tibial artery has moderate diffuse disease in the mid to distal segment. 3.  Successful orbital atherectomy and drug-coated balloon angioplasty to the left SFA.  Echo 03/2021:  Normal EF, no pulm htn  Recent thyroid issues,sugars high, hyperthyroidism Dose of thyroid medication adjusted per the patient Now feeling better  Echo 10/21   1. Left ventricular ejection fraction, by estimation, is 50  to 55%. The  left ventricle has low normal function. The left ventricle has no regional  wall motion abnormalities. There is mild left ventricular hypertrophy.  Left ventricular diastolic  parameters are consistent with Grade II diastolic dysfunction  (pseudonormalization).    2. Right ventricular systolic function is normal. The right ventricular  size is normal. Mildly increased right ventricular wall thickness.   3. The mitral valve is normal in structure. No evidence of mitral valve  regurgitation. No evidence of mitral stenosis.   4. The aortic valve is grossly normal. Aortic valve regurgitation is not  visualized. Mild aortic valve sclerosis is present, with no evidence of  aortic valve stenosis.   5. The inferior vena cava is normal in size with greater than 50%  respiratory variability, suggesting right atrial pressure of 3 mmHg.    Chronic severe back pain, Seen by neurosurgery.  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 as ejection fraction was depressed Echocardiogram June 2017, ejection fraction 25-30% She had follow-up echocardiogram   September 2017 showing normal LV functio   PMH:   has a past medical history of Acute pain of left shoulder (01/17/2019), Acute renal failure superimposed on stage 3b chronic kidney disease (HCC) (09/03/2022), Aortic atherosclerosis (HCC), Bilateral cataracts (01/2015), CHF (congestive heart failure) (HCC) (09/16/2013), Chronic low back pain, Clostridioides difficile infection (2015), COPD (chronic obstructive pulmonary disease) (HCC), Coronary artery disease, Depression, Diverticulosis, Frequent PVCs, GERD (gastroesophageal reflux disease), History of heart artery stent, Hyperlipidemia, Hypertension, Hypothyroidism, Impingement syndrome of left shoulder, LBBB (left bundle branch block), Left flank pain (10/28/2022), Long term current use of antithrombotics/antiplatelets,  Marijuana abuse, Myocardial infarction (HCC), OSA (obstructive sleep apnea), Pancreatitis (09/19/2022), Paroxysmal SVT (supraventricular tachycardia) (HCC) (05/08/2021), T2DM (type 2 diabetes mellitus) (HCC), Takotsubo cardiomyopathy, Tendonitis of left rotator cuff, Tobacco abuse, Vaginal burning (03/22/2019), and Vascular dementia.  PSH:    Past Surgical History:  Procedure Laterality Date   ABDOMINAL AORTOGRAM W/LOWER EXTREMITY N/A 09/12/2020   Procedure: ABDOMINAL AORTOGRAM W/LOWER EXTREMITY;  Surgeon: Iran Ouch, MD;  Location: [facility] INVASIVE CV LAB;  Service: Cardiovascular;  Laterality: N/A;   ABDOMINAL HYSTERECTOMY     APPENDECTOMY     BACK SURGERY     CARDIAC CATHETERIZATION N/A 01/01/2016   Procedure: Left Heart Cath and Coronary Angiography;  Surgeon: Corky Crafts, MD;  Location: [facility] INVASIVE CV LAB;  Service: Cardiovascular;  Laterality: N/A;   CATARACT EXTRACTION W/PHACO Left 01/30/2015   Procedure: CATARACT EXTRACTION PHACO AND INTRAOCULAR LENS PLACEMENT (IOC);  Surgeon: Galen Manila, MD;  Location: [facility] ORS;  Service: Ophthalmology;  Laterality: Left;  [place] 00:47    CATARACT EXTRACTION W/PHACO Right 02/13/2015   Procedure: CATARACT EXTRACTION PHACO AND INTRAOCULAR LENS PLACEMENT (IOC);  Surgeon: Galen Manila, MD;  Location: [facility] ORS;  Service: Ophthalmology;  Laterality: Right;  cassette lot # [id number] H [place]  00:29.9 AP  20.7 CDE  6.20   COLONOSCOPY N/A 11/02/2014   Procedure: COLONOSCOPY;  Surgeon: Louis Meckel, MD;  Location: [facility] ENDOSCOPY;  Service: Endoscopy;  Laterality: N/A;   CORONARY ANGIOPLASTY WITH STENT PLACEMENT Left 02/06/2011   Procedure: CORONARY ANGIOPLASTY WITH STENT PLACEMENT; Location: [facility]; Surgeon: Rudean Hitt, MD  CORONARY ANGIOPLASTY WITH STENT PLACEMENT Left 09/17/2011   Procedure: CORONARY ANGIOPLASTY WITH STENT PLACEMENT; Location: [facility]; Surgeon: Rudean Hitt, MD   CORONARY ANGIOPLASTY WITH STENT PLACEMENT Left 10/02/2011    Procedure: CORONARY ANGIOPLASTY WITH STENT PLACEMENT; Location: [facility]; Surgeon: Rudean Hitt, MD   CORONARY ANGIOPLASTY WITH STENT PLACEMENT Left 08/19/2012   Procedure: CORONARY ANGIOPLASTY WITH STENT PLACEMENT; Location: [facility]; Surgeon: Lorine Bears, MD   ESOPHAGOGASTRODUODENOSCOPY (EGD) WITH PROPOFOL N/A 09/21/2018   Procedure: ESOPHAGOGASTRODUODENOSCOPY (EGD) WITH PROPOFOL;  Surgeon: Wyline Mood, MD;  Location: [facility] ENDOSCOPY;  Service: Gastroenterology;  Laterality: N/A;   ESOPHAGOGASTRODUODENOSCOPY (EGD) WITH PROPOFOL N/A 10/06/2022   Procedure: ESOPHAGOGASTRODUODENOSCOPY (EGD) WITH PROPOFOL;  Surgeon: Wyline Mood, MD;  Location: [facility] ENDOSCOPY;  Service: Gastroenterology;  Laterality: N/A;   EYE SURGERY     LEFT HEART CATH AND CORONARY ANGIOGRAPHY Left 08/02/2014   Procedure: LEFT HEART CATH AND CORONARY ANGIOGRAPHY; Location: [facility]; Surgeon: Bryan Lemma, MD   PERIPHERAL VASCULAR ATHERECTOMY Left 09/12/2020   Procedure: PERIPHERAL VASCULAR ATHERECTOMY;  Surgeon: Iran Ouch, MD;  Location: [facility] INVASIVE CV LAB;  Service: Cardiovascular;  Laterality: Left;   PERIPHERAL VASCULAR BALLOON ANGIOPLASTY  09/12/2020   Procedure: PERIPHERAL VASCULAR BALLOON ANGIOPLASTY;  Surgeon: Iran Ouch, MD;  Location: [facility] INVASIVE CV LAB;  Service: Cardiovascular;;   REVERSE SHOULDER ARTHROPLASTY Left 10/01/2021   Procedure: REVERSE SHOULDER ARTHROPLASTY WITH BICEPS TENODESIS.;  Surgeon: Christena Flake, MD;  Location: [facility] ORS;  Service: Orthopedics;  Laterality: Left;   SHOULDER SURGERY Left 2017    Current Outpatient Medications  Medication Sig Dispense Refill   acetaminophen (TYLENOL) 650 MG CR tablet Take 1,300 mg by mouth 2 (two) times daily. Take with gabapentin     atorvastatin (LIPITOR) 80 MG tablet Take 80 mg by mouth daily.     carvedilol (COREG) 3.125 MG tablet Take 1 tablet (3.125 mg total) by mouth 2 (two) times daily. 180 tablet 3   clobetasol cream (TEMOVATE) 0.05 % Apply 1  Application topically 2 (two) times daily as needed. For vaginal itching. 30 g 0   clopidogrel (PLAVIX) 75 MG tablet Take 1 tablet (75 mg total) by mouth daily. 90 tablet 3   Continuous Glucose Receiver (FREESTYLE LIBRE 2 READER) DEVI Use with sensor to check blood sugars 6 times daily. 1 each 0   Continuous Glucose Sensor (FREESTYLE LIBRE 2 SENSOR) MISC USE ONE SENSOR EVERY 14 DAYS TO CHECK BLOOD SUGAR SIX TIMES PER DAY 6 each 1   DULoxetine (CYMBALTA) 60 MG capsule Take 60 mg by mouth daily.     ezetimibe (ZETIA) 10 MG tablet Take 1 tablet (10 mg total) by mouth daily. For cholesterol. 90 tablet 3   famotidine (PEPCID) 20 MG tablet Take 1 tablet (20 mg total) by mouth at bedtime. For nausea and acid 90 tablet 0   furosemide (LASIX) 20 MG tablet Take 1 tablet (20 mg total) by mouth as needed (As needed for weight gain greater than 3 pounds overnight or increased shortness of breath.). Take one tab only on Monday, Wednesday, & Fridays (Patient taking differently: Take 20 mg by mouth every Monday, Wednesday, and Friday.) 90 tablet 3   gabapentin (NEURONTIN) 300 MG capsule TAKE 1 CAPSULE BY MOUTH TWICE DAILY FOR PAIN 180 capsule 3   Glucagon (GVOKE HYPOPEN 2-PACK) 1 MG/0.2ML SOAJ Inject 1 mg (1 pen) as needed for severe low blood sugar (sustained glucose less than 55 despite oral glucose treatments). May repeat in 15 minutes as needed. 0.4 mL 1  insulin aspart (NOVOLOG FLEXPEN) 100 UNIT/ML FlexPen INJECT 6-16 UNITS  THREE TIMES DAILY WITH MEALS PER  SLIDING  SCALE  FOR  DIABETES (Patient taking differently: Inject 5 Units into the skin 3 (three) times daily with meals.) 45 mL 0   Insulin Pen Needle (INSUPEN PEN NEEDLES) 32G X 4 MM MISC Use to inject insulin 4 times daily. 250 each 1   isosorbide mononitrate (IMDUR) 30 MG 24 hr tablet Take 30 mg by mouth daily.     levothyroxine (SYNTHROID) 100 MCG tablet TAKE 1 TABLET BY MOUTH IN THE MORNING ON  AN  EMPTY  STOMACH  WITH  WATER  ONLY.  NO  FOOD  OR  OTHER   MEDICATIONS  FOR  30  MINUTES 90 tablet 1   mirtazapine (REMERON) 15 MG tablet Take 1 tablet (15 mg total) by mouth at bedtime. For sleep and depression (Patient taking differently: Take 15 mg by mouth at bedtime as needed (sleep).) 90 tablet 3   omeprazole (PRILOSEC) 40 MG capsule Take 1 capsule (40 mg total) by mouth daily. 90 capsule 3   ondansetron (ZOFRAN) 4 MG tablet Take 1 tablet (4 mg total) by mouth every 6 (six) hours as needed for nausea. 15 tablet 0   potassium chloride (KLOR-CON M) 10 MEQ tablet Only take on days you take your lasix. Take 1 tablet (10 mEq total) by mouth for 1 dose on those days you take your lasix 20mg  pill. (Patient taking differently: Take 10 mEq by mouth every Monday, Wednesday, and Friday.) 90 tablet 3   sitaGLIPtin (JANUVIA) 100 MG tablet Take 1 tablet (100 mg total) by mouth daily. for diabetes. 90 tablet 1   No current facility-administered medications for this visit.     Allergies:   Patient has no known allergies.   Social History:  The patient  reports that she has been smoking cigarettes. She has a 45 pack-year smoking history. She has never used smokeless tobacco. She reports current drug use. Drug: Marijuana. She reports that she does not drink alcohol.   Family History:   family history includes Colon cancer in her sister; Heart attack in her mother; Heart disease in her father and mother; Liver cancer in her brother; Throat cancer in her brother.    Review of Systems: Review of Systems  Constitutional: Negative.   HENT: Negative.    Respiratory: Negative.    Cardiovascular: Negative.   Gastrointestinal: Negative.   Musculoskeletal: Negative.   Neurological: Negative.   Psychiatric/Behavioral: Negative.    All other systems reviewed and are negative.    PHYSICAL EXAM: VS:  BP 120/72 (BP Location: Left Arm, Patient Position: Sitting, Cuff Size: Normal)   Pulse 71   Ht 5' 6.5" (1.689 m)   Wt 128 lb 2 oz (58.1 kg)   SpO2 98%   BMI 20.37  kg/m  , BMI Body mass index is 20.37 kg/m. Constitutional:  oriented to person, place, and time. No distress.  HENT:  Head: Grossly normal Eyes:  no discharge. No scleral icterus.  Neck: No JVD, no carotid bruits  Cardiovascular: Regular rate and rhythm, no murmurs appreciated Pulmonary/Chest: Clear to auscultation bilaterally, no wheezes or rails Abdominal: Soft.  no distension.  no tenderness.  Musculoskeletal: Normal range of motion Neurological:  normal muscle tone. Coordination normal. No atrophy Skin: Skin warm and dry Psychiatric: normal affect, pleasant  Recent Labs: 10/28/2022: ALT 9; Hemoglobin 13.8; Platelets 240.0 03/03/2023: BUN 20; Creatinine, Ser 1.20; Potassium 4.0; Sodium 137 09/03/2023: TSH  4.89    Lipid Panel Lab Results  Component Value Date   CHOL 171 03/03/2023   HDL 46.50 03/03/2023   LDLCALC 93 03/03/2023   TRIG 158.0 (H) 03/03/2023     Wt Readings from Last 3 Encounters:  09/11/23 128 lb 2 oz (58.1 kg)  09/03/23 126 lb (57.2 kg)  08/31/23 128 lb 6.4 oz (58.2 kg)     ASSESSMENT AND PLAN:  Preop cardiovascular evaluation for spinal cord stimulator Acceptable risk for procedure Would be able to stop Plavix 7 days prior to surgery, would take aspirin 81 mg daily when not on Plavix.  Okay to hold Plavix 14 days following procedure No further cardiac testing needed  CAD, multiple vessel -  Currently with no symptoms of angina. No further workup at this time. Continue current medication regimen. Smoking cessation recommended Sugar running high, she is medication compliant  Mixed hyperlipidemia -  on Crestor with Zetia, goal LDL less than 70 Lipid panel today  Diabetes mellitus type 2 with peripheral artery disease (HCC) -  A1c running high end of her range Reports some labile numbers  Chronic systolic congestive heart failure (HCC) -  Appears euvolemic  Continue carvedilol 3.125 twice daily Imdur, Jardiance Lasix 3 days a week  Essential  hypertension -  Blood pressure is well controlled on today's visit. No changes made to the medications.  Tobacco abuse - We have encouraged her to continue to work on weaning her cigarettes and smoking cessation. She will continue to work on this and does not want any assistance with chantix.    Dizziness/possible vertigo Reports symptoms have resolved  COPD, moderate (HCC) - Chronic cough, smoking cessation recommended, discussed again on today's visit  PAD February 2022 intervention lower extremities with Dr. Kirke Corin Denies claudication symptoms Smoking cessation recommended Check lipids today, goal LDL less than 55   Orders Placed This Encounter  Procedures   Lipid panel   EKG 12-Lead      Signed, Dossie Arbour, M.D., Ph.D. 09/11/2023  [facility] [place], [place] [phone]

## 2023-09-11 NOTE — Telephone Encounter (Signed)
   Patient Name: Sarah Payne  DOB: 09-29-1945 MRN: [id number]  Primary Cardiologist: Julien Nordmann, MD  Chart reviewed as part of pre-operative protocol coverage. Given past medical history and time since last visit, based on ACC/AHA guidelines, Sarah Payne is at acceptable risk for the planned procedure without further cardiovascular testing.   Per Dr. Mariah Milling "Seen in clinic today, Acceptable risk for procedure from cardiac perspective Okay to hold Plavix 5 days prior to procedure and 14 days after No further cardiac testing needed Thx TGollan "  Per office protocol, if patient is without any new symptoms or concerns at the time of their virtual visit, he/she may hold Plavix  for 5 days prior to procedure. Please resume Plavix as soon as possible postprocedure, at the discretion of the surgeon.    The patient was advised that if she develops new symptoms prior to surgery to contact our office to arrange for a follow-up visit, and she verbalized understanding.  I will route this recommendation to the requesting party via Epic fax function and remove from pre-op pool.  Please call with questions.  Joni Reining, NP 09/11/2023, 1:31 PM

## 2023-09-11 NOTE — Patient Instructions (Signed)
 Medication Instructions:  No changes  If you need a refill on your cardiac medications before your next appointment, please call your pharmacy.   Lab work: Lipids today  Testing/Procedures: No new testing needed  Follow-Up: At [facility], you and your health needs are our priority.  As part of our continuing mission to provide you with exceptional heart care, we have created designated Provider Care Teams.  These Care Teams include your primary Cardiologist (physician) and Advanced Practice Providers (APPs -  Physician Assistants and Nurse Practitioners) who all work together to provide you with the care you need, when you need it.  You will need a follow up appointment in 12 months  Providers on your designated Care Team:   Nicolasa Ducking, NP Eula Listen, PA-C Cadence Fransico [place], [place]  COVID-19 Vaccine Information can be found at: [email or web address] For questions related to vaccine distribution or appointments, please email [email or web address] or call [phone].

## 2023-09-12 LAB — LIPID PANEL
Chol/HDL Ratio: 3.3 {ratio} (ref 0.0–4.4)
Cholesterol, Total: 221 mg/dL — ABNORMAL HIGH (ref 100–199)
HDL: 67 mg/dL (ref >39)
LDL Chol Calc (NIH): 134 mg/dL — ABNORMAL HIGH (ref 0–99)
Triglycerides: 112 mg/dL (ref 0–149)
VLDL Cholesterol Cal: 20 mg/dL (ref 5–40)

## 2023-09-14 ENCOUNTER — Other Ambulatory Visit (INDEPENDENT_AMBULATORY_CARE_PROVIDER_SITE_OTHER): Payer: Medicare PPO | Admitting: Pharmacist

## 2023-09-14 DIAGNOSIS — E11649 Type 2 diabetes mellitus with hypoglycemia without coma: Secondary | ICD-10-CM

## 2023-09-14 NOTE — Progress Notes (Signed)
 09/14/2023 Name: Sarah Payne MRN: [id number] DOB: 21-Aug-1945  Chief Complaint  Patient presents with   Diabetes   Chronic Kidney Disease   Hyperlipidemia   Hypertension    Sarah Payne is a 78 y.o. year old female who presented for a pharmacotherapy telephone visit. They were referred to the pharmacist by their PCP for assistance in managing diabetes.  PMH also significant for CAD, CHF/takotsubo, HTN, hx postural dizziness/near-syncope, OSA, HLD, COPD, OSA, hypothyroidism, CKD, hyperlipidemia, diabetic neuropathy, tobacco dependence.  Diabetes Complications: peripheral neuropathy, DKD  Last Diabetes-Related Visit: 09/03/23 with PCP, 03/03/23 with Pharmacist Summary of Recent Change:  2/13: FGB 140 >240 s/p coffee); pre-lunch 110>240 s/p lunch followed by 40s-50s; HS 110. Reports unintentional wt loss -25lb. Start Januvia 100 mg  Care Team: Primary Care Provider: Doreene Nest, NP  Cardiologist: Julien Nordmann, MD; Last visit 09/11/23  Medication Access/Adherence Patient reports affordability concerns with their medications: No  Patient reports access/transportation concerns to their pharmacy: No  Patient reports adherence concerns with their medications:  No     Diabetes: Subjective: Since last visit with PCP, patient reports implementing plan as discussed. Has adhered to taking Novolog before meals tohugh has been using 30 minutes before eating. Since starting Januvia, reports she feels her BG levels have decreased overall but are dropping less. Denies side effects. No cost concern.   Current medications:  Januvia 100 mg daily Novolog 5 units once-twice daily (lunch if eating and supper time) - Using 30 minutes before eating.  Medications previously tried: glipizide, metformin IR (diarrhea), Trulicity (cost)  SMBG: CGM (Libre 2) - With reader Average glucose (Last 7 Days): 162 mg/dL (782 last 14 days) Average glucose by time of day:     12 am - 6 am: 150 mg/dL (0  lows)       6 am - 12 pm: 164 mg/dL (1 low)      12 pm - 6 pm: 166 mg/dL (2 lows)      6 pm - 12 pm: 170 mg/dL (3 lows)  Total hypoglycemia events in the last 14 days: 10 Total hypoglycemia events in the last 7 days: 6  Hypoglycemia: Patient reports hypoglycemic s/sx including dizziness, shakiness, sweating. Rarely over night. Most commonly occurring in the afternoon. Treats with a Pepsi or orange juice.   Diet:  Breakfast: skips. Coffee with 3 Splenda and half/half  Lunch (12-12:30 pm): Usually skips (estimates eats lunch ~3 days per week) "Something small": sandwich (tomatoes), sometimes chips. Dinner (6:30 pm): meat and 2 vegetables (low appetite, often will make dinner and find she is full after several bites - Never finishes a full meal) Snacks: May have small handful of chips Beverages: Sweet tea (makes her own w regular sugar)   DM Prevention:  Statin: Taking; high intensity.?  History of chronic kidney disease? yes History of albuminuria? yes, last UACR on 09/03/23 = 259 mg/g (increased from 2024 <30) ACE/ARB - Not taking; Urine MA/CR Ratio - elevated urinary albumin excretion.  Last eye exam: 2022; No retinopathy present - DUE (patient confirms no eye exam since 2022) Last foot exam: 06/03/2023 Tobacco Use: Current every day smoker (declines pharmacotherapy - discussed w cardiology)  Immunizations:? Flu: Up to date (Last: 06/03/2023); Pneumococcal: Up to Date; Shingrix: Up to date (Last: 01/07/2022, 02/15/2021)  Cardiovascular Risk Reduction History of clinical ASCVD? yes The 10-year ASCVD risk score (Arnett DK, et al., 2019) is: 45% History of heart failure? yes (cardiology follows) History of hyperlipidemia? yes (cardiology follows)  Current BMI: 20.3 kg/m2 (Ht 5' 6.5", Wt 58.1 kg) Taking statin? yes; high intensity (atorvastatin 80 mg) Taking aspirin? indicated (secondary prevention);  Taking Plavix 75 mg daily (cardiology follows)    Taking SGLT-2i? no Taking GLP- 1  RA? no   Heart Failure: - cardiology Current medications:  ACEi/ARB/ARNI: none, hypotension/orthostasis at baseline SGLT2i: None, c/f hypotension/orthostasis Beta blocker: carvedilol 3.125 mg twice daily. Titration limited by bradycardia Mineralocorticoid Receptor Antagonist: none Diuretic regimen: furosemide 20 mg MWF + K MWF  Reports checking BP at home regularly. Home readings usually 120-130s / 75 mmHg. No systolics <100.  No dizziness since last PCP visit.         _______________________________________________  Objective    Review of Systems:? Limited in the setting of virtual visit  Constitutional:? No fever, chills Cardiovascular:? No chest pain or pressure, shortness of breath, dyspnea on exertion, orthopnea or LE edema  Pulmonary:? Cough (chronic), No shortness of breath  GI:? No nausea, vomiting, constipation, diarrhea, abdominal pain, dyspepsia, change in bowel habits  Endocrine:? No polyuria, polyphagia or blurred vision    Physical Examination:  Vitals:  Wt Readings from Last 3 Encounters:  09/11/23 128 lb 2 oz (58.1 kg)  09/03/23 126 lb (57.2 kg)  08/31/23 128 lb 6.4 oz (58.2 kg)   BP Readings from Last 3 Encounters:  09/11/23 120/72  09/03/23 (!) 148/80  08/31/23 134/80   Pulse Readings from Last 3 Encounters:  09/11/23 71  09/03/23 79  08/19/23 72     Labs:?  Lab Results  Component Value Date   HGBA1C 8.5 (A) 09/03/2023   HGBA1C 8.0 (A) 06/03/2023   HGBA1C 8.1 (A) 03/03/2023   GLUCOSE 118 (H) 03/03/2023   MICRALBCREAT 259.0 (H) 09/03/2023   MICRALBCREAT 12.2 07/31/2022   MICRALBCREAT 1.0 05/30/2013   CREATININE 1.20 03/03/2023   CREATININE 1.25 (H) 10/28/2022   CREATININE 1.56 (H) 09/24/2022   GFR 44.44 (L) 03/03/2023   GFR 42.42 (L) 10/28/2022   GFR 26.69 (L) 09/19/2022    Lab Results  Component Value Date   CHOL 221 (H) 09/11/2023   LDLCALC 134 (H) 09/11/2023   LDLCALC 93 03/03/2023   LDLCALC 32 08/28/2022   LDLDIRECT 52  08/28/2022   LDLDIRECT 143.0 12/24/2018   HDL 67 09/11/2023   TRIG 112 09/11/2023   TRIG 158.0 (H) 03/03/2023   TRIG 213 (H) 08/28/2022   ALT 9 10/28/2022   ALT 235 (H) 09/24/2022   AST 14 10/28/2022   AST 94 (H) 09/24/2022      Chemistry      Component Value Date/Time   NA 137 03/03/2023 1011   NA 137 09/24/2022 1421   NA 136 08/02/2014 0408   K 4.0 03/03/2023 1011   K 3.1 (L) 08/02/2014 0408   CL 101 03/03/2023 1011   CL 100 08/02/2014 0408   CO2 29 03/03/2023 1011   CO2 28 08/02/2014 0408   BUN 20 03/03/2023 1011   BUN 22 09/24/2022 1421   BUN 15 08/02/2014 0408   CREATININE 1.20 03/03/2023 1011   CREATININE 1.06 (H) 04/04/2016 1721      Component Value Date/Time   CALCIUM 9.6 03/03/2023 1011   CALCIUM 8.5 08/02/2014 0408   ALKPHOS 86 10/28/2022 1127   ALKPHOS 73 06/12/2014 1703   AST 14 10/28/2022 1127   AST 21 06/12/2014 1703   ALT 9 10/28/2022 1127   ALT 16 06/12/2014 1703   BILITOT 0.4 10/28/2022 1127   BILITOT <0.2 09/24/2022 1421   BILITOT  0.3 06/12/2014 1703         Assessment and Plan:   1. Diabetes, type 2: uncontrolled per last A1c of 8.5% (09/03/23), increased from previous 8.0%. Reasonable goal < 7.5-8% given age, comorbidities, and risk of hypoglycemia. Surgical procedure rescheduled due to worsened glycemic control. Main glycemic concern at this time remains significant variability in BG (consistent pattern of <70 and >200 throughout the day). Ongoing lows though patient perceives less lows since starting Januvia and working on her insulin timing before meals rather than after.  CGM data shows improvement in avg sugar this past week (174 ?162 mg/dL) though w ongoing lows after meals.  Hypoglycemia: Avg 5 instances/week. Concentrated in afternoon 2/2 low intake + Novolog use Current Regimen: Januvia 100 mg daily, Novolog 5 units AC (1-2 times per day) Diet: Very low intake (does not eat full meals, reports only bites - 1-2 times per day) Exercise:  None  HCM: Due for eye exam. Encouraged patient to schedule esp since worsening UACR.   Continue medications today without changes.  Advised to inject Novolog 15 minutes before meals (currently doing 30 minutes before) Strongly encouraged reducing amount of sugar in her sweet tea  discussed slowly reducing sugar and mixing in a little bit of Stevia - avoid artificial sweeteners in the setting of CKD Reviewed risk of BG spike followed by crash with highly concentrated simple sugars like this.  Increase protein intake (low-sugar protein drink if cannot eat snacks)  Reviewed s/sx/tx hypoglycemia (1/2 can soda, 3-4 glucose tabs) Future Consideration: GLP1-RA: Not ideal in the setting of ongoing weight loss w very low food intake at baseline SGLT2i: Hx orthostatic dizziness has been a barrier historically Metformin: Not appropriate at this time given renal function  .  SU: Avoid in the setting of ongoing hypoglycemia  TZD: Avoiding int eh setting of diagnosed CHF Insulin: Not ideal per her minimal food intake. Options remain quite limited.     2. Kidney Disease (G3:A2): untreated/uncontrolled. CKD staging warrants Nephrology referral if UACR remains elevated on repeat as ordered by PCP. Not currently on RAAS or SGLT2i due to complex history of positional dizziness with pre-syncope. Needs to be renal protective therapies given evidence of decline in renal health per drastic increase in proteinuria + chronic reduced eGFR though would likely require adjustment in other CHF medications as to avoid hypotension (managed by cards). Current Regimen: N/A Continue medications without changes.  Future Consideration: Nephrology Referral RAASi: Strong recommendation to slow decline of renal function and to reduce albuminuria.  SGLT2i: Consider risk/benefit if UACR remains elevated despite max tolerated RAAS. SGLT2i beneficial regardless given incontrolled DM though c/f orthostatic intolerance noted  previously. Patient does not drink a lot of water, skips meals frequently. C/f risk euglycemic DKA on SGKT2i.    2. HTN/CHF: BP controlled based on last clinic BP of 120/72 mmHg (09/11/23), goal <130/80 mmHg. Does not monitor BP at home. Denies lightheadedness, dizziness, SOB, CP, vision changes. Normotensive on current CHF regimen though renal disease untreated.  Future Consideration: RAASi: Strong recommendation SGLT2i: Patient does not drink a lot of water, skips meals frequently. C/f risk euglycemic DKA on SGKT2i. Consider risk/benefit if UACR remains elevated despite max tolerated RAAS. SGLT2i beneficial regardless given uncontrolled DM though c/f orthostatic intolerance noted previously.     Follow Up Follow up with clinical pharmacist via phone in 3 weeks to review SMBG/hypoglycemia   Future Appointments  Date Time Provider Department Center  09/25/2023 10:15 AM Otho Ket, RN [facility]  None  10/07/2023 10:00 AM [facility] CCM PHARMACIST [facility]  12/01/2023 12:00 PM Doreene Nest, NP [facility]    Loree Fee, PharmD Clinical Pharmacist [facility] [phone]

## 2023-09-16 ENCOUNTER — Other Ambulatory Visit: Payer: Medicare PPO

## 2023-09-19 ENCOUNTER — Encounter: Payer: Self-pay | Admitting: Cardiovascular Disease

## 2023-09-21 ENCOUNTER — Telehealth: Payer: Self-pay | Admitting: Neurosurgery

## 2023-09-21 ENCOUNTER — Telehealth: Payer: Self-pay | Admitting: Student in an Organized Health Care Education/Training Program

## 2023-09-21 NOTE — Telephone Encounter (Signed)
 Patient was unable to do SCS Trial due to high blood sugar. She is in a lot of pain and asking if she can get some injections. Please advise patient

## 2023-09-21 NOTE — Telephone Encounter (Signed)
 Called patient and explained to her that Dr. Cherylann Ratel would probably not want to do injections at this point due to the use of steroids and it's effect on blood sugars. Patient states she will call Dr. Michaelle Copas office and see if they can help her. She is going to have a follow-up glucose check in 2 weeks to see if she can reschedule her SCS implant. I also offered her a VV with Dr. Cherylann Ratel if she wishes. Patient states she will call back if she wishes to make that appointment.

## 2023-09-21 NOTE — Telephone Encounter (Signed)
*  post op thoracic laminectomy for spinal cord stimulator placement [facility]) on 09/22/23-cancelled Since her surgery was cancelled because of her A1c being high. She needs pain medication to help her function due to her back pain.  She has had blood sugars under 160 for a week. She does not want another injection so the pain clinic told her to ask Dr. Katrinka Blazing for pain medication. [address]

## 2023-09-22 ENCOUNTER — Ambulatory Visit: Admit: 2023-09-22 | Payer: Medicare PPO | Admitting: Neurosurgery

## 2023-09-22 SURGERY — THORACIC LAMINECTOMY FOR SPINAL CORD STIMULATOR
Anesthesia: General

## 2023-09-23 NOTE — Telephone Encounter (Signed)
 Patient is aware that we can not prescribe pain medication. She will just take OTC medication.

## 2023-09-25 ENCOUNTER — Ambulatory Visit: Payer: Self-pay

## 2023-09-25 ENCOUNTER — Other Ambulatory Visit: Payer: Self-pay | Admitting: Emergency Medicine

## 2023-09-25 MED ORDER — REPATHA SURECLICK 140 MG/ML ~~LOC~~ SOAJ: 140.0000 mg | SUBCUTANEOUS | 3 refills | Status: DC

## 2023-09-25 NOTE — Patient Outreach (Signed)
 Care Coordination   09/25/2023 Name: Sarah Payne MRN: [id number] DOB: Nov 02, 1945   Care Coordination Outreach Attempts:  An unsuccessful outreach was attempted for an appointment today. HIPAA compliant message left with return call phone number.   Follow Up Plan:  Additional outreach attempts will be made to offer the patient complex care management information and services.   Encounter Outcome:  No Answer   Care Coordination Interventions:  No, not indicated    [name] RN, BSN, CCM [employer], Population Health Case Manager Phone: [phone]

## 2023-10-01 ENCOUNTER — Other Ambulatory Visit
Admission: RE | Admit: 2023-10-01 | Discharge: 2023-10-01 | Disposition: A | Discharge status: Home / Self Care | Attending: Neurosurgery | Admitting: Neurosurgery

## 2023-10-01 DIAGNOSIS — Z01812 Encounter for preprocedural laboratory examination: Secondary | ICD-10-CM | POA: Insufficient documentation

## 2023-10-01 DIAGNOSIS — Z01818 Encounter for other preprocedural examination: Secondary | ICD-10-CM | POA: Insufficient documentation

## 2023-10-03 LAB — FRUCTOSAMINE: Fructosamine: 295 umol/L — ABNORMAL HIGH (ref 0–285)

## 2023-10-04 ENCOUNTER — Other Ambulatory Visit: Payer: Self-pay | Admitting: Primary Care

## 2023-10-04 ENCOUNTER — Other Ambulatory Visit: Payer: Self-pay | Admitting: Physician Assistant

## 2023-10-04 DIAGNOSIS — E039 Hypothyroidism, unspecified: Secondary | ICD-10-CM

## 2023-10-04 DIAGNOSIS — I5033 Acute on chronic diastolic (congestive) heart failure: Secondary | ICD-10-CM

## 2023-10-04 DIAGNOSIS — E876 Hypokalemia: Secondary | ICD-10-CM

## 2023-10-04 DIAGNOSIS — I10 Essential (primary) hypertension: Secondary | ICD-10-CM

## 2023-10-05 ENCOUNTER — Encounter: Payer: Medicare PPO | Admitting: Physician Assistant

## 2023-10-05 ENCOUNTER — Telehealth: Payer: Self-pay

## 2023-10-05 ENCOUNTER — Other Ambulatory Visit: Payer: Self-pay

## 2023-10-05 DIAGNOSIS — Z01818 Encounter for other preprocedural examination: Secondary | ICD-10-CM

## 2023-10-05 NOTE — Telephone Encounter (Signed)
 I discussed her recent fructosamine test with Dr Katrinka Blazing - it is appropriate to reschedule her surgery now.

## 2023-10-05 NOTE — Telephone Encounter (Addendum)
 I spoke with Sarah Payne. I offered to schedule her surgery for next week, but she requested to wait until April. We discussed the following:   Planned surgery: thoracic laminectomy for spinal cord stimulator placement with internal pulse generator implant [facility])    Surgery date: 10/27/23 at [facility] (Medical Mall: [address] [place], [place] [address]) - you will find out your arrival time the business day before your surgery.   Pre-op appointment at [facility] Pre-admit Testing: we will call you with a date/time for this. If you are scheduled for an in person appointment, Pre-admit Testing is located on the first floor of the [facility] building, [address] [facility], [address]. Please bring all prescriptions in the original prescription bottles to your appointment. During this appointment, they will advise you which medications you can take the morning of surgery, and which medications you will need to hold for surgery. Labs (such as blood work, EKG) may be done at your pre-op appointment. You are not required to fast for these labs. Should you need to change your pre-op appointment, please call Pre-admit testing at [phone].     Blood thinners:   Plavix:     Stop Plavix 7 days prior, resume Plavix 14 days after   Aspirin 81mg : take daily when not on Plavix per Dr Mariah Milling     Common restrictions after surgery: No bending, lifting, or twisting ("BLT"). Avoid lifting objects heavier than 10 pounds for the first 6 weeks after surgery. Where possible, avoid household activities that involve lifting, bending, reaching, pushing, or pulling such as laundry, vacuuming, grocery shopping, and childcare. Try to arrange for help from friends and family for these activities while you heal. Do not drive while taking prescription pain medication. Weeks 6 through 12 after surgery: avoid lifting more than 25 pounds.     How to contact us:  If you  have any questions/concerns before or after surgery, you can reach [place] at [phone], or you can send a mychart message. We can be reached by phone or mychart 8am-4pm, Monday-Friday.  *Please note: Calls after 4pm are forwarded to a third party answering service. Mychart messages are not routinely monitored during evenings, weekends, and holidays. Please call our office to contact the answering service for urgent concerns during non-business hours.    If you have FMLA/disability paperwork, please drop it off or fax it to [phone], attention Patty.   Appointments/FMLA & disability paperwork: Joycelyn Rua, & Flonnie Hailstone Registered Nurse/Surgery scheduler: Royston Cowper Medical Assistants: Nash Mantis Physician Assistants: Joan Flores, PA-C, Manning Charity, PA-C & Drake Leach, PA-C Surgeons: Venetia Night, MD & Ernestine Mcmurray, MD

## 2023-10-06 ENCOUNTER — Other Ambulatory Visit (INDEPENDENT_AMBULATORY_CARE_PROVIDER_SITE_OTHER): Admitting: Pharmacist

## 2023-10-06 DIAGNOSIS — E1151 Type 2 diabetes mellitus with diabetic peripheral angiopathy without gangrene: Secondary | ICD-10-CM

## 2023-10-06 NOTE — Progress Notes (Signed)
 10/06/2023 Name: Sarah Payne MRN: [id number] DOB: [birth year]  Chief Complaint  Patient presents with   Diabetes    Sarah Payne is a 78 y.o. year old female who presented for a pharmacotherapy telephone visit. They were referred to the pharmacist by their PCP for assistance in managing diabetes.  PMH also significant for CAD, CHF/takotsubo, HTN, hx postural dizziness/near-syncope, OSA, HLD, COPD, OSA, hypothyroidism, CKD, hyperlipidemia, diabetic neuropathy, tobacco dependence.  Diabetes Complications: peripheral neuropathy, DKD  Last Diabetes-Related Visit: 09/03/23 with PCP, 03/03/23 with Pharmacist Summary of Recent Change:  2/13: FGB 140 >240 s/p coffee; pre-lunch 110>240 s/p lunch followed by 40s-50s; HS 110. Reports unintentional wt loss -25lb. Start Januvia 100 mg  Care Team: Primary Care Provider: Doreene Nest, NP  Cardiologist: Julien Nordmann, MD; Last visit 09/11/23  Medication Access/Adherence Patient reports affordability concerns with their medications: No  Patient reports access/transportation concerns to their pharmacy: No  Patient reports adherence concerns with their medications:  No     Diabetes: Subjective: Since last visit, reports doing well. Feels Januvia is working well to stabilize her sugars. Notes she has been skipping dinner Novolog. Has been waiting to injection lunchtime Novolog until she sees her sugar spike (~1 hour after eating lunch)  Current medications:  Januvia 100 mg daily Novolog 5 units TIDAC - Has been using only 5 units ~1 hour after lunch  Medications previously tried: glipizide, metformin IR (diarrhea), Trulicity (cost)  SMBG: CGM (Libre 2) - With reader Average glucose (Last 7 Days): 157 mg/dL  Average glucose by time of day:     12 am - 6 am: 134 mg/dL (1 low)       6 am - 12 pm: 158 mg/dL (0 lows)      12 pm - 6 pm: 183 mg/dL (0 lows)      6 pm - 12 pm: 157 mg/dL (1 low)  Total hypoglycemia events in the last 7 days:  2  Hypoglycemia: Patient reports hypoglycemic s/sx including dizziness, shakiness, sweating. Significantly less frequent since last visit. Treats with a Pepsi or orange juice.   Diet:  Breakfast: skips. Coffee with 3 Splenda and half/half  Lunch (12-12:30 pm): Skips or has a sandwich (estimates eats lunch ~3 days per week) sandwich (tomatoes), sometimes chips, bologna sandwich Dinner (6:30 pm): meat and 2 vegetables (low appetite, often will make dinner and find she is full after several bites, no carb side - Never finishes a full meal) Snacks: May have small handful of chips Beverages: Sweet tea (makes her own w regular sugar)   DM Prevention:  Statin: Taking; high intensity.?  History of chronic kidney disease? yes History of albuminuria? yes, last UACR on 09/03/23 = 259 mg/g (increased from 2024 <30) ACE/ARB - Not taking; Urine MA/CR Ratio - elevated urinary albumin excretion.  Last eye exam: 2022; No retinopathy present - DUE (patient confirms no eye exam since 2022) Last foot exam: 06/03/2023 Tobacco Use: Current every day smoker (declines pharmacotherapy - discussed w cardiology)  Immunizations:? Flu: Up to date (Last: 06/03/2023); Pneumococcal: Up to Date; Shingrix: Up to date (Last: 01/07/2022, 02/15/2021)  Cardiovascular Risk Reduction History of clinical ASCVD? yes The 10-year ASCVD risk score (Arnett DK, et al., 2019) is: 45% History of heart failure? yes (cardiology follows) History of hyperlipidemia? yes (cardiology follows) Current BMI: 20.3 kg/m2 (Ht 5' 6.5", Wt 58.1 kg) Taking statin? yes; high intensity (atorvastatin 80 mg) Taking aspirin? indicated (secondary prevention);  Taking Plavix 75 mg daily (cardiology follows)  Taking SGLT-2i? no Taking GLP- 1 RA? no   Heart Failure: - cardiology Current medications:  ACEi/ARB/ARNI: none, hypotension/orthostasis at baseline SGLT2i: None, c/f hypotension/orthostasis Beta blocker: carvedilol 3.125 mg twice daily.  Titration limited by bradycardia Mineralocorticoid Receptor Antagonist: none Diuretic regimen: furosemide 20 mg MWF + K MWF  Reports checking BP at home regularly with all readings <130/80 mmHg.  No dizziness since last PCP visit.      _______________________________________________  Objective    Review of Systems:? Limited in the setting of virtual visit  Constitutional:? No fever, chills Cardiovascular:? No chest pain or pressure, shortness of breath, dyspnea on exertion, orthopnea or LE edema  Pulmonary:? No shortness of breath  GI:? No nausea, vomiting, constipation, diarrhea, abdominal pain, dyspepsia, change in bowel habits  Endocrine:? No polyuria, polyphagia or blurred vision    Physical Examination:  Vitals:  Wt Readings from Last 3 Encounters:  09/11/23 128 lb 2 oz (58.1 kg)  09/03/23 126 lb (57.2 kg)  08/31/23 128 lb 6.4 oz (58.2 kg)   BP Readings from Last 3 Encounters:  09/11/23 120/72  09/03/23 (!) 148/80  08/31/23 134/80   Pulse Readings from Last 3 Encounters:  09/11/23 71  09/03/23 79  08/19/23 72    Labs:?  Lab Results  Component Value Date   HGBA1C 8.5 (A) 09/03/2023   HGBA1C 8.0 (A) 06/03/2023   HGBA1C 8.1 (A) 03/03/2023   GLUCOSE 118 (H) 03/03/2023   MICRALBCREAT 259.0 (H) 09/03/2023   MICRALBCREAT 12.2 07/31/2022   MICRALBCREAT 1.0 05/30/2013   CREATININE 1.20 03/03/2023   CREATININE 1.25 (H) 10/28/2022   CREATININE 1.56 (H) 09/24/2022   GFR 44.44 (L) 03/03/2023   GFR 42.42 (L) 10/28/2022   GFR 26.69 (L) 09/19/2022    Lab Results  Component Value Date   CHOL 221 (H) 09/11/2023   LDLCALC 134 (H) 09/11/2023   LDLCALC 93 03/03/2023   LDLCALC 32 08/28/2022   LDLDIRECT 52 08/28/2022   LDLDIRECT 143.0 12/24/2018   HDL 67 09/11/2023   TRIG 112 09/11/2023   TRIG 158.0 (H) 03/03/2023   TRIG 213 (H) 08/28/2022   ALT 9 10/28/2022   ALT 235 (H) 09/24/2022   AST 14 10/28/2022   AST 94 (H) 09/24/2022      Chemistry      Component  Value Date/Time   NA 137 03/03/2023 1011   NA 137 09/24/2022 1421   NA 136 08/02/2014 0408   K 4.0 03/03/2023 1011   K 3.1 (L) 08/02/2014 0408   CL 101 03/03/2023 1011   CL 100 08/02/2014 0408   CO2 29 03/03/2023 1011   CO2 28 08/02/2014 0408   BUN 20 03/03/2023 1011   BUN 22 09/24/2022 1421   BUN 15 08/02/2014 0408   CREATININE 1.20 03/03/2023 1011   CREATININE 1.06 (H) 04/04/2016 1721      Component Value Date/Time   CALCIUM 9.6 03/03/2023 1011   CALCIUM 8.5 08/02/2014 0408   ALKPHOS 86 10/28/2022 1127   ALKPHOS 73 06/12/2014 1703   AST 14 10/28/2022 1127   AST 21 06/12/2014 1703   ALT 9 10/28/2022 1127   ALT 16 06/12/2014 1703   BILITOT 0.4 10/28/2022 1127   BILITOT <0.2 09/24/2022 1421   BILITOT 0.3 06/12/2014 1703         Assessment and Plan:   1. Diabetes, type 2: uncontrolled per last A1c of 8.5% (09/03/23), increased from 8.0%. Reasonable goal < 7.5% (age, comorbidities). Frequent hypoglycemia has significantly improved since stopping dinnertime Novolog.  Hyperglycemia concentrated to s/p lunch meal 2/2 waiting to inject Novolog until 1h after meal once sugar spikes.   CGM - continued improvement (Avg: 174 ?162 ?157 mg/dL - correlates to estimated A1c 7.1%). FBG mostly at goal <130, avg 134 mg/dL.  Current Regimen: Januvia 100 mg daily, Novolog 5 units AC lunch (skipping dinner dose) Diet: Very low intake (does not eat full meals, reports only bites - 1-2 times per day. Low/no carb dinners) Exercise: None  HCM: Due for eye exam. Encouraged patient to schedule esp since worsening UACR.   Continue Novolog use with lunch meal only unless having carb for dinner Emphasized importance of using Novolog before/up to time of lunch meal to prevent spikes rather than treat them Diet: Strongly encouraged reducing amount of sugar in her sweet tea  discussed slowly reducing sugar and mixing in a little bit of Stevia - avoid artificial sweeteners in the setting of  CKD Reviewed risk of BG spike followed by crash with highly concentrated simple sugars like this.  Increase protein intake (low-sugar protein drink if cannot eat snacks)  Reviewed s/sx/tx hypoglycemia (1/2 can soda, 3-4 glucose tabs) Future Consideration: GLP1-RA: Not ideal in the setting of ongoing weight loss w very low food intake at baseline SGLT2i: Hx orthostatic dizziness has been a barrier historically Metformin: Not appropriate at this time given renal function  .  SU: Avoid in the setting of ongoing hypoglycemia  TZD: Avoiding int eh setting of diagnosed CHF Insulin: Conservative use - titration not ideal per her minimal food intake. Options remain quite limited.     2. Kidney Disease (G3:A2): untreated/uncontrolled. CKD staging warrants Nephrology referral if UACR remains elevated on repeat as ordered by PCP. Not currently on RAAS or SGLT2i due to complex history of positional dizziness with pre-syncope. Needs to be renal protective therapies given evidence of decline in renal health per drastic increase in proteinuria + chronic reduced eGFR though would likely require adjustment in other CHF medications as to avoid hypotension (managed by cards). Current Regimen: N/A Continue medications without changes.  Future Consideration: Nephrology Referral RAASi: Strong recommendation to slow decline of renal function and to reduce albuminuria.  SGLT2i: Consider risk/benefit if UACR remains elevated despite max tolerated RAAS. SGLT2i beneficial regardless given incontrolled DM though c/f orthostatic intolerance noted previously. Patient does not drink a lot of water, skips meals frequently. C/f risk euglycemic DKA on SGKT2i.    2. HTN/CHF: BP controlled based on last clinic BP of 120/72 mmHg, goal <130/80 mmHg. Denies lightheadedness, dizziness, SOB, CP, vision changes. Normotensive on current CHF regimen though renal disease untreated.  Future Consideration: RAASi: Strong  recommendation SGLT2i: Patient does not drink a lot of water, skips meals frequently. C/f risk euglycemic DKA on SGKT2i. Consider risk/benefit if UACR remains elevated despite max tolerated RAAS. SGLT2i beneficial regardless given uncontrolled DM though c/f orthostatic intolerance noted previously.    Follow Up Follow up with clinical pharmacist via phone in 4 weeks to review SMBG/hypoglycemia  PCP visit scheduled 2 months   Future Appointments  Date Time Provider Department Center  10/22/2023  1:00 PM [facility] ANNUAL WELLNESS VISIT 2 [facility]  11/04/2023  9:00 AM [facility] PHARMACIST [facility]  11/12/2023 10:00 AM Susanne Borders, PA [facility] None  12/01/2023 12:00 PM Doreene Nest, NP [facility]  12/07/2023 10:45 AM Lovenia Kim, MD [facility] None   Loree Fee, PharmD Clinical Pharmacist [facility] [phone]

## 2023-10-07 ENCOUNTER — Other Ambulatory Visit: Payer: Medicare PPO

## 2023-10-19 ENCOUNTER — Encounter
Admission: RE | Admit: 2023-10-19 | Discharge: 2023-10-19 | Disposition: A | Discharge status: Home / Self Care | Source: Ambulatory Visit | Attending: Neurosurgery | Admitting: Neurosurgery

## 2023-10-19 ENCOUNTER — Encounter: Payer: Self-pay | Admitting: Neurosurgery

## 2023-10-19 ENCOUNTER — Other Ambulatory Visit: Payer: Self-pay

## 2023-10-19 VITALS — Ht 66.0 in | Wt 125.0 lb

## 2023-10-19 DIAGNOSIS — I110 Hypertensive heart disease with heart failure: Secondary | ICD-10-CM | POA: Insufficient documentation

## 2023-10-19 DIAGNOSIS — Z01812 Encounter for preprocedural laboratory examination: Secondary | ICD-10-CM | POA: Insufficient documentation

## 2023-10-19 DIAGNOSIS — I5032 Chronic diastolic (congestive) heart failure: Secondary | ICD-10-CM | POA: Diagnosis not present

## 2023-10-19 DIAGNOSIS — Z01818 Encounter for other preprocedural examination: Secondary | ICD-10-CM | POA: Diagnosis present

## 2023-10-19 DIAGNOSIS — I10 Essential (primary) hypertension: Secondary | ICD-10-CM

## 2023-10-19 DIAGNOSIS — I5181 Takotsubo syndrome: Secondary | ICD-10-CM

## 2023-10-19 DIAGNOSIS — E1151 Type 2 diabetes mellitus with diabetic peripheral angiopathy without gangrene: Secondary | ICD-10-CM | POA: Insufficient documentation

## 2023-10-19 LAB — CBC
HCT: 37.2 % (ref 36.0–46.0)
Hemoglobin: 12.4 g/dL (ref 12.0–15.0)
MCH: 33.2 pg (ref 26.0–34.0)
MCHC: 33.3 g/dL (ref 30.0–36.0)
MCV: 99.7 fL (ref 80.0–100.0)
Platelets: 274 10*3/uL (ref 150–400)
RBC: 3.73 MIL/uL — ABNORMAL LOW (ref 3.87–5.11)
RDW: 12.8 % (ref 11.5–15.5)
WBC: 6.4 10*3/uL (ref 4.0–10.5)
nRBC: 0 % (ref 0.0–0.2)

## 2023-10-19 LAB — TYPE AND SCREEN
ABO/RH(D): O POS
Antibody Screen: NEGATIVE

## 2023-10-19 LAB — BASIC METABOLIC PANEL WITH GFR
Anion gap: 10 (ref 5–15)
BUN: 37 mg/dL — ABNORMAL HIGH (ref 8–23)
CO2: 24 mmol/L (ref 22–32)
Calcium: 9.2 mg/dL (ref 8.9–10.3)
Chloride: 104 mmol/L (ref 98–111)
Creatinine, Ser: 1.45 mg/dL — ABNORMAL HIGH (ref 0.44–1.00)
GFR, Estimated: 38 mL/min — ABNORMAL LOW (ref >60)
Glucose, Bld: 200 mg/dL — ABNORMAL HIGH (ref 70–99)
Potassium: 4.3 mmol/L (ref 3.5–5.1)
Sodium: 138 mmol/L (ref 135–145)

## 2023-10-19 LAB — SURGICAL PCR SCREEN
MRSA, PCR: NEGATIVE
Staphylococcus aureus: NEGATIVE

## 2023-10-19 NOTE — Patient Instructions (Addendum)
 Your procedure is scheduled on:   Tuesday April 8  Report to the Registration Desk on the 1st floor of the [facility] To find out your arrival time, please call [phone] between 1PM - 3PM on:   Monday April 7  If your arrival time is 6:00 am, do not arrive before that time as the [facility] entrance doors do not open until 6:00 am.  REMEMBER: Instructions that are not followed completely may result in serious medical risk, up to and including death; or upon the discretion of your surgeon and anesthesiologist your surgery may need to be rescheduled.  Do not eat food after midnight the night before surgery.  No gum chewing or hard candies.  You may however, drink WATER up to 2 hours before you are scheduled to arrive for your surgery. Do not drink anything within 2 hours of your scheduled arrival time.   One week prior to surgery:TUESDAY APRIL 1 Stop Anti-inflammatories (NSAIDS) such as Advil, Aleve, Ibuprofen, Motrin, Naproxen, Naprosyn and Aspirin based products such as Excedrin, Goody's Powder, BC Powder. Stop ANY OVER THE COUNTER supplements until after surgery.  You may however, continue to take Tylenol if needed for pain up until the day of surgery.  **Follow guidelines for insulin and diabetes medications.** insulin aspart (NOVOLOG FLEXPEN) take 1/2 dose (3 units) evening dose the night before surgery                                                                 DO NOT TAKE ANY THE MORNING OF SURGERY  Patient instructed to contact Diabetic Coordinator regarding instruction for the insulin pump setting during the surgical procedure  sitaGLIPtin (JANUVIA) do not take the morning of surgery   **Follow recommendations regarding stopping blood thinners.** clopidogrel (PLAVIX) hold 7 days prior to surgery last dose Monday March 31  Aspirin 81mg : take daily when not on Plavix. Do not take the day of surgery.   Continue taking all of your other prescription medications up  until the day of surgery.  ON THE DAY OF SURGERY ONLY TAKE THESE MEDICATIONS WITH SIPS OF WATER:  DULoxetine (CYMBALTA)  omeprazole (PRILOSEC)  levothyroxine (SYNTHROID)  gabapentin (NEURONTIN)  carvedilol (COREG)   No Alcohol for 24 hours before or after surgery.  No Smoking including e-cigarettes for 24 hours before surgery.  No chewable tobacco products for at least 6 hours before surgery.  No nicotine patches on the day of surgery.  Do not use any "recreational" drugs for at least a week (preferably 2 weeks) before your surgery.  Please be advised that the combination of cocaine and anesthesia may have negative outcomes, up to and including death. If you test positive for cocaine, your surgery will be cancelled.  On the morning of surgery brush your teeth with toothpaste and water, you may rinse your mouth with mouthwash if you wish. Do not swallow any toothpaste or mouthwash.  Use CHG Soap as directed on instruction sheet.  Do not wear jewelry, make-up, hairpins, clips or nail polish.  For welded (permanent) jewelry: bracelets, anklets, waist bands, etc.  Please have this removed prior to surgery.  If it is not removed, there is a chance that hospital personnel will need to cut it off on the day of  surgery.  Do not wear lotions, powders, or perfumes.   Do not shave body hair from the neck down 48 hours before surgery.  Contact lenses, hearing aids and dentures may not be worn into surgery.  Do not bring valuables to the hospital. [facility] is not responsible for any missing/lost belongings or valuables.   Notify your doctor if there is any change in your medical condition (cold, fever, infection).  Wear comfortable clothing (specific to your surgery type) to the hospital.  After surgery, you can help prevent lung complications by doing breathing exercises.  Take deep breaths and cough every 1-2 hours.   If you are being discharged the day of surgery, you will not be  allowed to drive home. You will need a responsible individual to drive you home and stay with you for 24 hours after surgery.   If you are taking public transportation, you will need to have a responsible individual with you.  Please call the Pre-admissions Testing Dept. at [phone] if you have any questions about these instructions.  Surgery Visitation Policy:  Patients having surgery or a procedure may have two visitors.  Children under the age of 18 must have an adult with them who is not the patient.      Pre-operative 5 CHG Bath Instructions   You can play a key role in reducing the risk of infection after surgery. Your skin needs to be as free of germs as possible. You can reduce the number of germs on your skin by washing with CHG (chlorhexidine gluconate) soap before surgery. CHG is an antiseptic soap that kills germs and continues to kill germs even after washing.   DO NOT use if you have an allergy to chlorhexidine/CHG or antibacterial soaps. If your skin becomes reddened or irritated, stop using the CHG and notify one of our RNs at [phone].   Please shower with the CHG soap starting 4 days before surgery using the following schedule:  START FRIDAY APRIL 4     Please keep in mind the following:  DO NOT shave, including legs and underarms, starting the day of your first shower.   You may shave your face at any point before/day of surgery.  Place clean sheets on your bed the day you start using CHG soap. Use a clean washcloth (not used since being washed) for each shower. DO NOT sleep with pets once you start using the CHG.   CHG Shower Instructions:  If you choose to wash your hair and private area, wash first with your normal shampoo/soap.  After you use shampoo/soap, rinse your hair and body thoroughly to remove shampoo/soap residue.  Turn the water OFF and apply about 3 tablespoons (45 ml) of CHG soap to a CLEAN washcloth.  Apply CHG soap ONLY FROM YOUR  NECK DOWN TO YOUR TOES (washing for 3-5 minutes)  DO NOT use CHG soap on face, private areas, open wounds, or sores.  Pay special attention to the area where your surgery is being performed.  If you are having back surgery, having someone wash your back for you may be helpful. Wait 2 minutes after CHG soap is applied, then you may rinse off the CHG soap.  Pat dry with a clean towel  Put on clean clothes/pajamas   If you choose to wear lotion, please use ONLY the CHG-compatible lotions on the back of this paper.     Additional instructions for the day of surgery: DO NOT APPLY any lotions, deodorants,  cologne, or perfumes.   Put on clean/comfortable clothes.  Brush your teeth.  Ask your nurse before applying any prescription medications to the skin.      CHG Compatible Lotions   Aveeno Moisturizing lotion  Cetaphil Moisturizing Cream  Cetaphil Moisturizing Lotion  Clairol Herbal Essence Moisturizing Lotion, Dry Skin  Clairol Herbal Essence Moisturizing Lotion, Extra Dry Skin  Clairol Herbal Essence Moisturizing Lotion, Normal Skin  Curel Age Defying Therapeutic Moisturizing Lotion with Alpha Hydroxy  Curel Extreme Care Body Lotion  Curel Soothing Hands Moisturizing Hand Lotion  Curel Therapeutic Moisturizing Cream, Fragrance-Free  Curel Therapeutic Moisturizing Lotion, Fragrance-Free  Curel Therapeutic Moisturizing Lotion, Original Formula  Eucerin Daily Replenishing Lotion  Eucerin Dry Skin Therapy Plus Alpha Hydroxy Crme  Eucerin Dry Skin Therapy Plus Alpha Hydroxy Lotion  Eucerin Original Crme  Eucerin Original Lotion  Eucerin Plus Crme Eucerin Plus Lotion  Eucerin TriLipid Replenishing Lotion  Keri Anti-Bacterial Hand Lotion  Keri Deep Conditioning Original Lotion Dry Skin Formula Softly Scented  Keri Deep Conditioning Original Lotion, Fragrance Free Sensitive Skin Formula  Keri Lotion Fast Absorbing Fragrance Free Sensitive Skin Formula  Keri Lotion Fast Absorbing  Softly Scented Dry Skin Formula  Keri Original Lotion  Keri Skin Renewal Lotion Keri Silky Smooth Lotion  Keri Silky Smooth Sensitive Skin Lotion  Nivea Body Creamy Conditioning Oil  Nivea Body Extra Enriched Teacher, adult education Moisturizing Lotion Nivea Crme  Nivea Skin Firming Lotion  NutraDerm 30 Skin Lotion  NutraDerm Skin Lotion  NutraDerm Therapeutic Skin Cream  NutraDerm Therapeutic Skin Lotion  ProShield Protective Hand Cream  Provon moisturizing lotion

## 2023-10-20 ENCOUNTER — Encounter: Payer: Self-pay | Admitting: Neurosurgery

## 2023-10-20 NOTE — Progress Notes (Signed)
 Perioperative / Anesthesia Services  Pre-Admission Testing Clinical Review / Pre-Operative Anesthesia Consult  Date: 10/20/23  Patient Demographics:  Name: Sarah Payne DOB: [birth year] MRN:   [id number]  Planned Surgical Procedure(s):    Case: [id number] Date/Time: 10/27/23 0715   Procedure: THORACIC LAMINECTOMY FOR SPINAL CORD STIMULATOR PLACEMENT WITH INTERNAL PULSE GENERATOR IMPLANT - THORACIC LAMINECTOMY FOR SPINAL CORD STIMULATOR PLACEMENT WITH INTERNAL PULSE GENERATOR IMPLANT   Anesthesia type: General   Diagnosis:      Failed back surgical syndrome [M96.1]     History of lumbar fusion [Z98.1]     Chronic radicular lumbar pain [M54.16, G89.29]     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 Clinical nursing staff has updated patient's PMH/PSHx, current medication list, and drug allergies/intolerances to ensure comprehensive history available to assist in medical decision making as it pertains to the aforementioned surgical procedure and anticipated anesthetic course. Extensive review of available clinical information personally performed. Sarah Payne PMH and PSHx updated with any diagnoses/procedures that  may have been inadvertently omitted during his intake with the pre-admission testing department's nursing staff.  Clinical Discussion:  Sarah Payne is a 78 y.o. female who is submitted for pre-surgical anesthesia review and clearance  prior to her undergoing the above procedure. Patient is a Current Smoker (45 pack years). Pertinent PMH includes: CAD, multiple MIs (? 5), Takotsubo cardiomyopathy, CHF, PSVT, aortic atherosclerosis, frequent PVCs, LBBB, HTN, HLD, T2DM, hypothyroidism, COPD, OSAH (does not require nocturnal PAP therapy), GERD (no daily Tx), chronic radicular lower back pain with BILATERAL sciatica, nephrolithiasis, polysubstance abuse (marijuana and tobacco), vascular dementia, depression, increased home stress (husband has PTSD).   Patient is followed by cardiology Mariah Milling, MD). She was last seen in the cardiology clinic on 09/11/2023; notes reviewed. At the time of her clinic visit, patient doing well overall from a cardiovascular perspective. Patient denied any chest pain, shortness of breath, PND, orthopnea, palpitations, significant peripheral edema, weakness, fatigue, vertiginous symptoms, or presyncope/syncope. Patient with a past medical history significant for cardiovascular diagnoses. Documented physical exam was grossly benign, providing no evidence of acute exacerbation and/or decompensation of the patient's known cardiovascular conditions.  Patient has suffered multiple (? 5) myocardial infarctions in the past per her report. These cardiovascular events, coupled with a history of unstable angina, has necessitated patient undergoing multiple invasive diagnostic LEFT heart catheterization for further evaluation of her coronary anatomy. Given her history of extensive coronary artery disease, patient has required multiple percutaneous interventions with stent placement as follows:    DATE LOCATION SIZE TYPE  02/06/2011 Mid LCx 2.5 x 12 mm Xience V DES  02/06/2011 Mid LCx 2.5 x 8 mm Xience V DES  02/06/2011 Mid LCx 2.25 x 8 mm Xience Nano DES  09/17/2011 Distal RCA 2.75 x 23 mm Xience V DES  10/02/2011 Proximal LCx 2.5 x 15 mm Xience  V DES  08/19/2012 Proximal RCA 3.5 x 23 mm Xience Xpedition DES  08/19/2012 Mid  RCA 3.0 x 38 mm Xience Xpedition DES    Last diagnostic heart catheterization was performed on 01/01/2016 which revealed patent stents in the RCA and LCx.  LVEF 25-30%. There was severe left ventricular systolic dysfunction in the pattern of Takotsubo cardiomyopathy.  Patient with extreme situational stress prior to event.  Nephew committed suicide 1 week prior and her sister died the morning of the cardiac event.   Patient underwent orbital atherectomy on 09/12/2020 for subtotal occlusion of the LEFT distal SFA.    Long-term cardiac event monitor study performed on 05/08/2021 revealing a predominant underlying normal sinus rhythm with an average heart rate of 80 bpm; range 53-146 bpm.  There were 3 distinct SVT runs with the fastest lasting 5 beats at a maximum rate of 146 bpm, and the longest lasting 7 beats at an average heart rate of 134 bpm.  There was rare atrial and ventricular ectopy noted.  Most recent TTE performed on 10/16/2022 revealed a normal left ventricular systolic function with an EF of 55-60%. Septal wall dyskinesis secondary to bundle branch block was observed. There is moderate LVH. Left ventricular diastolic Doppler parameters consistent with abnormal relaxation (G1DD). GLS - 9.5%,. Right ventricular size and function normal with a TAPSE measuring 1.9 cm  (normal range >/= 1.6 cm).  Aortic valve sclerosis/calcification present. There was mild valve regurgitation. All transvalvular gradients were noted to be normal providing no evidence suggestive of valvular stenosis. Aorta normal in size with no evidence of ectasia or aneurysmal dilatation.  Given patient's history of PVD and multiple PCI's with stent placement, patient remains on daily antithrombotic therapy using clopidogrel.  Patient is reportedly compliant with therapy with no evidence or reports of GI/GU related bleeding. Blood pressure well controlled at 120/72 mmHg on currently prescribed  beta-blocker (carvedilol), diuretic  (furosemide), and nitrate (isosorbide mononitrate) therapies.  Patient is on atorvastatin + ezetimibe + PCSK9i (evolocumab) for her HLD diagnosis and ASCVD prevention.  T2DM loosely controlled on currently prescribed regimen; last HgbA1c was 8.5% when checked on 09/03/2023.  Of note, since patient was seen by cardiology, a fructosamine level has been obtained and found to be elevated at 295 mol/L indicating poor control of her diabetes.  Patient does have an OSAH diagnosis, however she does not require the use of nocturnal PAP therapy.  Functional capacity somewhat limited by patient's age and chronic back pain.  With that said, patient able to complete all of her ADLs/IADLs without cardiovascular limitation.  Per the [facility], patient felt to be able to achieve at least 4 METS of physical activity without experiencing any significant degrees of angina/anginal equivalent symptoms. No changes were made to her medication regimen during her visit with cardiology.  Patient scheduled to follow-up with outpatient cardiology in 1 year or sooner if needed.  Sarah Payne is scheduled for an elective THORACIC LAMINECTOMY FOR SPINAL CORD STIMULATOR PLACEMENT WITH INTERNAL PULSE GENERATOR IMPLANT on 10/27/2023 with Dr. Ernestine Mcmurray, MD.  Given patient's past medical history significant for cardiovascular diagnoses, presurgical cardiac clearance was sought by the PAT team. Per cardiology, "based ACC/AHA guidelines, the patient's past medical history, and the amount of time since her last clinic visit, this patient would be at an overall ACCEPTABLE risk for the planned procedure without further cardiovascular testing or intervention at this time".   Again, this patient is on daily oral antithrombotic therapy using clopidogrel. She has been  instructed on recommendations for holding her clopidogrel for 7 days prior to her procedure with plans to restart as soon as postoperative bleeding risk felt to be minimized by his attending  surgeon. The patient has been instructed that her last dose of clopidogrel should be on 10/19/2023.  Patient denies previous perioperative complications with anesthesia in the past. In review her EMR, it is noted that patient underwent a general anesthetic course here at [facility] ([facility]) in 09/2022 without documented complications.      10/19/2023    8:41 AM 09/11/2023    9:56 AM 09/03/2023    9:38 AM  Vitals with BMI  Height 5\' 6"  5' 6.5" 5\' 6"   Weight 125 lbs 128 lbs 2 oz 126 lbs  BMI 20.19 20.37 20.35  Systolic  120 148  Diastolic  72 80  Pulse  71 79   Providers/Specialists:  NOTE: Primary physician provider listed below. Patient may have been seen by APP or partner within same practice.   PROVIDER ROLE / SPECIALTY LAST OV  Lovenia Kim, MD Neurosurgery (Surgeon) 08/31/2023  Doreene Nest, NP Primary Care Provider 09/03/2023  Julien Nordmann, MD Cardiology 09/11/2023  Edward Jolly, MD Pain Management 08/19/2023   Allergies:  No Known Allergies Current Home Medications:   No current facility-administered medications for this encounter.    acetaminophen (TYLENOL) 650 MG CR tablet   atorvastatin (LIPITOR) 80 MG tablet   carvedilol (COREG) 3.125 MG tablet   clobetasol cream (TEMOVATE) 0.05 %   clopidogrel (PLAVIX) 75 MG tablet   DULoxetine (CYMBALTA) 60 MG capsule   Evolocumab (REPATHA SURECLICK) 140 MG/ML SOAJ   ezetimibe (ZETIA) 10 MG tablet   famotidine (PEPCID) 20 MG tablet   furosemide (LASIX) 20 MG tablet   gabapentin (NEURONTIN) 300 MG capsule   Glucagon (GVOKE HYPOPEN 2-PACK) 1 MG/0.2ML SOAJ   isosorbide mononitrate (IMDUR) 30 MG 24 hr tablet   levothyroxine (SYNTHROID) 100 MCG tablet   mirtazapine (REMERON) 15 MG tablet   omeprazole (PRILOSEC) 40 MG capsule   ondansetron (ZOFRAN) 4 MG tablet   potassium chloride (KLOR-CON M) 10 MEQ tablet   sitaGLIPtin (JANUVIA) 100 MG tablet   Continuous Glucose Receiver  (FREESTYLE LIBRE 2 READER) DEVI   Continuous Glucose Sensor (FREESTYLE LIBRE 2 SENSOR) MISC   insulin aspart (NOVOLOG FLEXPEN) 100 UNIT/ML FlexPen   Insulin Pen Needle (INSUPEN PEN NEEDLES) 32G X 4 MM MISC   History:   Past Medical History:  Diagnosis Date   Acute pain of left shoulder 01/17/2019   Aortic atherosclerosis (HCC)    CHF (congestive heart failure) (HCC) 09/16/2013   a.) TTE 09/16/2013: EF 55-60%; mod inferior HK, triv TR; G1DD. b.) TTE 01/02/2016: EF 25-30%; mid-apicalateroseptal, lat, inf, inferoseptal, apical akinesis; triv TR; G1DD. c. TTE 04/16/2016: EF 65%; G1DD. d.) TTE 05/02/2020: EF 50-55%; G2DD. e.) TTE 04/11/2021: EF 55%; G1DD.   Chronic low back pain with bilateral sciatica    Chronic painful diabetic neuropathy (HCC)    Chronic radicular lumbar pain    CKD (chronic kidney disease), stage III (HCC)    Clostridioides difficile infection 2015   COPD (chronic obstructive pulmonary disease) (HCC)    Coronary artery disease    a.) PCI 02/06/2011: 100% mLCx - DES x 3. b.) PCI 09/17/2011: 95% dRCA - DES x 1. c.) PCI 10/02/2011: 75% pLCX - DES x 1. d.) PCI 08/19/2012: DES x 2 p-mRCA.  e.) LHC 01/01/16: Takotsubo event 01/01/2016 with patent stents  Depression    Diverticulosis    Failed back syndrome    Frequent PVCs    a. Noted in hospital 12/2015.   GERD (gastroesophageal reflux disease)    History of bilateral cataract extraction 01/2015   History of heart artery stent    a.) TOTAL of 7 stents --> 02/06/2011 - overlapping 2.5 x 12 mm Xience V, 2.5 x 8 mm Xience V, 2.25 x 8 mm Xience Nano to mLCx; 09/17/2011 - 2.5 x 23 Xience V dRCA; 10/02/2011 - 2.5 x 15 mm Xience V pLCx; 08/09/2012 - overlapping 3.0 x 38mm mRCA and 3.5 x 23mm pRCA   Hyperlipidemia    Hypertension    Hypothyroidism    Impingement syndrome of left shoulder    LBBB (left bundle branch block)    Long term current use of clopidogrel    Marijuana abuse    Myocardial infarction [facility])    a.) multiple  MIs;  5 per her report   OSA (obstructive sleep apnea)    a.) mild; does not require nocturnal PAP therapy   Pancreatitis 09/19/2022   Paroxysmal SVT (supraventricular tachycardia) (HCC) 05/08/2021   a.)  Zio patch 05/08/2021: 3 distinct SVT runs; fastest 5 beats at a rate of 146 bpm; longest 7 beats at rate of 134 bpm   T2DM (type 2 diabetes mellitus) (HCC)    Takotsubo cardiomyopathy    a. 12/2015 - nephew committed suicide 1 week prior, sister died the morning of presentation - initially called a STEMI; cath with patent stents. LVEF 25-30%.   Tendonitis of left rotator cuff    Tobacco abuse    Vaginal burning 03/22/2019   Vascular dementia    Past Surgical History:  Procedure Laterality Date   ABDOMINAL AORTOGRAM W/LOWER EXTREMITY N/A 09/12/2020   Procedure: ABDOMINAL AORTOGRAM W/LOWER EXTREMITY;  Surgeon: Iran Ouch, MD;  Location: [facility] INVASIVE CV LAB;  Service: Cardiovascular;  Laterality: N/A;   ABDOMINAL HYSTERECTOMY     APPENDECTOMY     BACK SURGERY     CARDIAC CATHETERIZATION N/A 01/01/2016   Procedure: Left Heart Cath and Coronary Angiography;  Surgeon: Corky Crafts, MD;  Location: [facility] INVASIVE CV LAB;  Service: Cardiovascular;  Laterality: N/A;   CATARACT EXTRACTION W/PHACO Left 01/30/2015   Procedure: CATARACT EXTRACTION PHACO AND INTRAOCULAR LENS PLACEMENT (IOC);  Surgeon: Galen Manila, MD;  Location: [facility] ORS;  Service: Ophthalmology;  Laterality: Left;  [place] 00:47    CATARACT EXTRACTION W/PHACO Right 02/13/2015   Procedure: CATARACT EXTRACTION PHACO AND INTRAOCULAR LENS PLACEMENT (IOC);  Surgeon: Galen Manila, MD;  Location: [facility] ORS;  Service: Ophthalmology;  Laterality: Right;  cassette lot # [id number] H [place]  00:29.9 AP  20.7 CDE  6.20   COLONOSCOPY N/A 11/02/2014   Procedure: COLONOSCOPY;  Surgeon: Louis Meckel, MD;  Location: [facility] ENDOSCOPY;  Service: Endoscopy;  Laterality: N/A;   CORONARY ANGIOPLASTY WITH STENT PLACEMENT Left 02/06/2011   Procedure:  CORONARY ANGIOPLASTY WITH STENT PLACEMENT; Location: [facility]; Surgeon: Rudean Hitt, MD   CORONARY ANGIOPLASTY WITH STENT PLACEMENT Left 09/17/2011   Procedure: CORONARY ANGIOPLASTY WITH STENT PLACEMENT; Location: [facility]; Surgeon: Rudean Hitt, MD   CORONARY ANGIOPLASTY WITH STENT PLACEMENT Left 10/02/2011   Procedure: CORONARY ANGIOPLASTY WITH STENT PLACEMENT; Location: [facility]; Surgeon: Rudean Hitt, MD   CORONARY ANGIOPLASTY WITH STENT PLACEMENT Left 08/19/2012   Procedure: CORONARY ANGIOPLASTY WITH STENT PLACEMENT; Location: [facility]; Surgeon: Lorine Bears, MD   ESOPHAGOGASTRODUODENOSCOPY (EGD) WITH PROPOFOL N/A 09/21/2018   Procedure: ESOPHAGOGASTRODUODENOSCOPY (EGD) WITH PROPOFOL;  Surgeon: Tobi Bastos,  Sharlet Salina, MD;  Location: [facility] ENDOSCOPY;  Service: Gastroenterology;  Laterality: N/A;   ESOPHAGOGASTRODUODENOSCOPY (EGD) WITH PROPOFOL N/A 10/06/2022   Procedure: ESOPHAGOGASTRODUODENOSCOPY (EGD) WITH PROPOFOL;  Surgeon: Wyline Mood, MD;  Location: [facility] ENDOSCOPY;  Service: Gastroenterology;  Laterality: N/A;   EYE SURGERY     LEFT HEART CATH AND CORONARY ANGIOGRAPHY Left 08/02/2014   Procedure: LEFT HEART CATH AND CORONARY ANGIOGRAPHY; Location: [facility]; Surgeon: Jadalyn Oliveri Lemma, MD   PERIPHERAL VASCULAR ATHERECTOMY Left 09/12/2020   Procedure: PERIPHERAL VASCULAR ATHERECTOMY;  Surgeon: Iran Ouch, MD;  Location: [facility] INVASIVE CV LAB;  Service: Cardiovascular;  Laterality: Left;   PERIPHERAL VASCULAR BALLOON ANGIOPLASTY  09/12/2020   Procedure: PERIPHERAL VASCULAR BALLOON ANGIOPLASTY;  Surgeon: Iran Ouch, MD;  Location: [facility] INVASIVE CV LAB;  Service: Cardiovascular;;   REVERSE SHOULDER ARTHROPLASTY Left 10/01/2021   Procedure: REVERSE SHOULDER ARTHROPLASTY WITH BICEPS TENODESIS.;  Surgeon: Christena Flake, MD;  Location: [facility] ORS;  Service: Orthopedics;  Laterality: Left;   SHOULDER SURGERY Left 2017   Family History  Problem Relation Age of Onset   Heart attack Mother        First MI  @ 18 - Died @ 37   Heart disease Mother    Heart disease Father        Died @ 48   Throat cancer Brother    Liver cancer Brother    Colon cancer Sister    Social History   Tobacco Use   Smoking status: Every Day    Current packs/day: 1.00    Average packs/day: 1 pack/day for 45.0 years (45.0 ttl pk-yrs)    Types: Cigarettes   Smokeless tobacco: Never   Tobacco comments:    Has cut back, trying to quit.   Substance Use Topics   Alcohol use: No    Alcohol/week: 0.0 standard drinks of alcohol   Pertinent Clinical Results:  LABS:  Hospital Outpatient Visit on 10/19/2023  Component Date Value Ref Range Status   MRSA, PCR 10/19/2023 NEGATIVE  NEGATIVE Final   Staphylococcus aureus 10/19/2023 NEGATIVE  NEGATIVE Final   Comment: (NOTE) The Xpert SA Assay (FDA approved for NASAL specimens in patients 23 years of age and older), is one component of a comprehensive surveillance program. It is not intended to diagnose infection nor to guide or monitor treatment. Performed at [facility], [address] [place], [place] [address]    Sodium 10/19/2023 138  135 - 145 mmol/L Final   Potassium 10/19/2023 4.3  3.5 - 5.1 mmol/L Final   Chloride 10/19/2023 104  98 - 111 mmol/L Final   CO2 10/19/2023 24  22 - 32 mmol/L Final   Glucose, Bld 10/19/2023 200 (H)  70 - 99 mg/dL Final   Glucose reference range applies only to samples taken after fasting for at least 8 hours.   BUN 10/19/2023 37 (H)  8 - 23 mg/dL Final   Creatinine, Ser 10/19/2023 1.45 (H)  0.44 - 1.00 mg/dL Final   Calcium 60/45/4098 9.2  8.9 - 10.3 mg/dL Final   GFR, Estimated 10/19/2023 38 (L)  >60 mL/min Final   Comment: (NOTE) Calculated using the CKD-EPI Creatinine Equation (2021)    Anion gap 10/19/2023 10  5 - 15 Final   Performed at [facility], [address] [place], [place] [address]   WBC 10/19/2023 6.4  4.0 - 10.5 K/uL Final   RBC 10/19/2023 3.73 (L)  3.87 - 5.11 MIL/uL Final    Hemoglobin 10/19/2023 12.4  12.0 -  15.0 g/dL Final   HCT 16/04/9603 37.2  36.0 - 46.0 % Final   MCV 10/19/2023 99.7  80.0 - 100.0 fL Final   MCH 10/19/2023 33.2  26.0 - 34.0 pg Final   MCHC 10/19/2023 33.3  30.0 - 36.0 g/dL Final   RDW 54/03/8118 12.8  11.5 - 15.5 % Final   Platelets 10/19/2023 274  150 - 400 K/uL Final   nRBC 10/19/2023 0.0  0.0 - 0.2 % Final   Performed at [facility], [address] [place], [place] [address]   ABO/RH(D) 10/19/2023 O POS   Final   Antibody Screen 10/19/2023 NEG   Final   Sample Expiration 10/19/2023 11/02/2023,2359   Final   Extend sample reason 10/19/2023    Final                   Value:NO TRANSFUSIONS OR PREGNANCY IN THE PAST 3 MONTHS Performed at [facility], [address] [place], [place] [address]   Hospital Outpatient Visit on 10/01/2023  Component Date Value Ref Range Status   Fructosamine 10/01/2023 295 (H)  0 - 285 umol/L Final   Comment: (NOTE) Published reference interval for apparently healthy subjects between age 24 and 66 is 80 - 285 umol/L and in a poorly controlled diabetic population is 228 - 563 umol/L with a mean of 396 umol/L. Performed At: [facility] [address] [place], [place] [id number] Jolene Schimke MD NG:[phone]     ECG: Date: 09/11/2023 Time ECG obtained: 1001 AM Rate: 71 bpm Rhythm:  Sinus rhythm with occasional PVCs Axis (leads I and aVF): left Intervals: PR 184 ms. QRS 114 ms. QTc 425 ms. ST segment and T wave changes: Lateral ST/T wave abnormality.  Evidence of a possible, age undetermined, prior infarct:  Yes; anterior Comparison: Similar to previous tracing obtained on 09/03/2022   IMAGING / PROCEDURES: MR THORACIC SPINE WO CONTRAST performed on 05/12/2023 Stable broad-based disc protrusion at T12-L1 with moderate left and mild right foraminal stenosis. Mild right foraminal narrowing at T10-11 secondary to asymmetric right-sided facet hypertrophy. No other  significant stenosis in the thoracic spine. Minimal disc bulging at T1-2 and T10-11 without significant central canal stenosis. Additional disc disease at L1-2 and L2-3 is stable from the previous lumbar spine MRI. Scout images demonstrate multilevel disc disease in cervical spine, described on the recent cervical spine MRI.  TRANSTHORACIC ECHOCARDIOGRAM performed on 10/16/2022 Left ventricular ejection fraction, by estimation, is 55 to 60%. Left ventricular ejection fraction by PLAX is 54 %. The left ventricle has normal function. Septal wall dyskinesis secondary to bundle branch block. There is moderate left ventricular hypertrophy. Left ventricular diastolic parameters are consistent with Grade I diastolic dysfunction (impaired relaxation). The average left ventricular global longitudinal strain is -9.5 %.  Right ventricular systolic function is normal. The right ventricular size is normal. Tricuspid regurgitation signal is inadequate for assessing PA pressure.  The mitral valve is normal in structure. Mild mitral valve regurgitation. No evidence of mitral stenosis.  The aortic valve has an indeterminant number of cusps. Aortic valve regurgitation is not visualized. Aortic valve sclerosis/calcification is present, without any evidence of aortic stenosis.  The inferior vena cava is normal in size with greater than 50% respiratory variability, suggesting right atrial pressure of 3 mmHg.   MR CERVICAL SPINE WO CONTRAST performed on 02/06/2023 Multilevel cervical spondylosis with resultant mild diffuse spinal stenosis at C3-4 through C5-6. Multifactorial degenerative changes with resultant multilevel foraminal narrowing as above. Notable findings  include severe right worse than left C5 foraminal stenosis, with moderate left C6 foraminal narrowing. Small right foraminal disc protrusion at C7-T1, potentially affecting the exiting right C8 nerve root. Reactive marrow edema about the right C3-4 facet due  to facet arthritis. Finding could serve as a source for neck pain and referred symptoms.  CT RENAL STONE STUDY performed on 09/11/2022 Left kidney cysts. Left kidney stone. No hydronephrosis. Diverticulosis. Thoracolumbosacral degenerative changes and postop L3-S1 fusion.  LONG TERM CARDIAC EVENT MONITOR STUDY performed on 05/08/2021 Predominant underlying sinus rhythm with an average heart rate of 80 bpm; range 53-146 bpm Bundle branch block/IVCD present 3 SVT runs occurred with the fastest lasting 5 beats at a maximum rate of 146 bpm, and the longest lasting 7 beats with an average rate of 134 bpm. Isolated SVE's, SVE couplets, and SVE triplets were rare (<1.0%) Isolated VE's and VE couplets were rare (<1.0%).  There were no VE triplets present. Ventricular trigeminy was present. Rare patient triggered events associated with ectopy.  LEFT HEART CATHETERIZATION AND CORONARY ANGIOGRAPHY performed on 01/01/2016 Patent RCA and LCx stents LAD, RPDA, and RPAV all with minimal or luminal irregularities Severe left ventricular systolic dysfunction in a pattern of Takotsubo cardiomyopathy; LVEF 25-35% Medical management recommended.    Impression and Plan:  Sarah Payne has been referred for pre-anesthesia review and clearance prior to her undergoing the planned anesthetic and procedural courses. Available labs, pertinent testing, and imaging results were personally reviewed by me in preparation for upcoming operative/procedural course. [facility] medical record has been updated following extensive record review and patient interview with PAT staff.   This patient has been appropriately cleared by cardiology with an overall ACCEPTABLE risk of experiencing significant perioperative cardiovascular complications. Based on clinical review performed today (10/20/23), barring any significant acute changes in the patient's overall condition, it is anticipated that she will be able to proceed with  the planned surgical intervention. Any acute changes in clinical condition may necessitate her procedure being postponed and/or cancelled. Patient will meet with anesthesia team (MD and/or CRNA) on the day of her procedure for preoperative evaluation/assessment. Questions regarding anesthetic course will be fielded at that time.   Pre-surgical instructions were reviewed with the patient during his PAT appointment, and questions were fielded to satisfaction by PAT clinical staff. She has been instructed on which medications that she will need to hold prior to surgery, as well as the ones that have been deemed safe/appropriate to take on the day of her procedure. As part of the general education provided by PAT, patient made aware both verbally and in writing, that she would need to abstain from the use of any illegal substances during her perioperative course. She was advised that failure to follow the provided instructions could necessitate case cancellation or result in serious perioperative complications up to and including death. Patient encouraged to contact PAT and/or her surgeon's office to discuss any questions or concerns that may arise prior to surgery; verbalized understanding.   Quentin Mulling, MSN, APRN, FNP-C, CEN [facility]  Perioperative Services Nurse Practitioner Phone: [phone] Fax: [phone] 10/20/23 12:32 PM  NOTE: This note has been prepared using Dragon dictation software. Despite my best ability to proofread, there is always the potential that unintentional transcriptional errors may still occur from this process.

## 2023-10-22 ENCOUNTER — Ambulatory Visit

## 2023-10-22 VITALS — BP 120/72 | Ht 66.0 in | Wt 125.0 lb

## 2023-10-22 DIAGNOSIS — Z0000 Encounter for general adult medical examination without abnormal findings: Secondary | ICD-10-CM

## 2023-10-22 DIAGNOSIS — Z5320 Procedure and treatment not carried out because of patient's decision for unspecified reasons: Secondary | ICD-10-CM

## 2023-10-22 NOTE — Patient Instructions (Signed)
 Sarah Payne , Thank you for taking time to come for your Medicare Wellness Visit. I appreciate your ongoing commitment to your health goals. Please review the following plan we discussed and let me know if I can assist you in the future.   Referrals/Orders/Follow-Ups/Clinician Recommendations: follow up in 1 year   This is a list of the screening recommended for you and due dates:  Health Maintenance  Topic Date Due   Screening for Lung Cancer  09/04/2023   Eye exam for diabetics  11/20/2023*   Flu Shot  02/19/2024   Hemoglobin A1C  03/02/2024   Complete foot exam   06/02/2024   Yearly kidney health urinalysis for diabetes  09/02/2024   Yearly kidney function blood test for diabetes  10/18/2024   Medicare Annual Wellness Visit  10/21/2024   Colon Cancer Screening  11/01/2024   DTaP/Tdap/Td vaccine (3 - Td or Tdap) 06/02/2033   Pneumonia Vaccine  Completed   DEXA scan (bone density measurement)  Completed   Hepatitis C Screening  Completed   Zoster (Shingles) Vaccine  Completed   HPV Vaccine  Aged Out   COVID-19 Vaccine  Discontinued  *Topic was postponed. The date shown is not the original due date.    Advanced directives: (Declined) Advance directive discussed with you today. Even though you declined this today, please call our office should you change your mind, and we can give you the proper paperwork for you to fill out.  Next Medicare Annual Wellness Visit scheduled for next year: Yes

## 2023-10-22 NOTE — Progress Notes (Signed)
 Because this visit was a virtual/telehealth visit,  certain criteria was not obtained, such a blood pressure, CBG if applicable, and timed get up and go. Any medications not marked as "taking" were not mentioned during the medication reconciliation part of the visit. Any vitals not documented were not able to be obtained due to this being a telehealth visit or patient was unable to self-report a recent blood pressure reading due to a lack of equipment at home via telehealth. Vitals that have been documented are verbally provided by the patient.   Subjective:   Sarah Payne is a 78 y.o. who presents for a Medicare Wellness preventive visit.  Visit Complete: Virtual I connected with  Sarah Payne on 10/22/23 by a audio enabled telemedicine application and verified that I am speaking with the correct person using two identifiers.  Patient Location: Home  Provider Location: Home Office  I discussed the limitations of evaluation and management by telemedicine. The patient expressed understanding and agreed to proceed.  Vital Signs: Because this visit was a virtual/telehealth visit, some criteria may be missing or patient reported. Any vitals not documented were not able to be obtained and vitals that have been documented are patient reported.  VideoDeclined- This patient declined Librarian, academic. Therefore the visit was completed with audio only.  Persons Participating in Visit: Patient.  AWV Questionnaire: No: Patient Medicare AWV questionnaire was not completed prior to this visit.  Cardiac Risk Factors include: advanced age (>45men, >90 women);diabetes mellitus;hypertension;Other (see comment), Risk factor comments: COPD     Objective:    Today's Vitals   10/22/23 1302 10/22/23 1303  BP: 120/72   Weight: 125 lb (56.7 kg)   Height: 5\' 6"  (1.676 m)   PainSc:  6    Body mass index is 20.18 kg/m.     10/22/2023    1:02 PM 10/19/2023    8:50 AM  08/19/2023    8:51 AM 08/12/2023    9:06 AM 08/05/2023   12:44 PM 06/08/2023    8:48 AM 05/06/2023    9:49 AM  Advanced Directives  Does Patient Have a Medical Advance Directive? No No No No No Yes No  Type of Advance Directive      Healthcare Power of Attorney   Would patient like information on creating a medical advance directive? No - Patient declined No - Patient declined     Yes (MAU/Ambulatory/Procedural Areas - Information given)    Current Medications (verified) Outpatient Encounter Medications as of 10/22/2023  Medication Sig   acetaminophen (TYLENOL) 650 MG CR tablet Take 1,300 mg by mouth 2 (two) times daily.   atorvastatin (LIPITOR) 80 MG tablet Take 80 mg by mouth daily.   carvedilol (COREG) 3.125 MG tablet Take 1 tablet (3.125 mg total) by mouth 2 (two) times daily.   clobetasol cream (TEMOVATE) 0.05 % Apply 1 Application topically 2 (two) times daily as needed. For vaginal itching.   clopidogrel (PLAVIX) 75 MG tablet Take 1 tablet (75 mg total) by mouth daily.   Continuous Glucose Receiver (FREESTYLE LIBRE 2 READER) DEVI Use with sensor to check blood sugars 6 times daily.   Continuous Glucose Sensor (FREESTYLE LIBRE 2 SENSOR) MISC USE ONE SENSOR EVERY 14 DAYS TO CHECK BLOOD SUGAR SIX TIMES PER DAY   DULoxetine (CYMBALTA) 60 MG capsule Take 60 mg by mouth daily.   Evolocumab (REPATHA SURECLICK) 140 MG/ML SOAJ Inject 140 mg into the skin every 14 (fourteen) days.   ezetimibe (ZETIA) 10  MG tablet Take 1 tablet (10 mg total) by mouth daily. For cholesterol.   famotidine (PEPCID) 20 MG tablet Take 1 tablet (20 mg total) by mouth at bedtime. For nausea and acid   furosemide (LASIX) 20 MG tablet Take 1 tablet (20 mg total) by mouth as needed (As needed for weight gain greater than 3 pounds overnight or increased shortness of breath.). Take one tab only on Monday, Wednesday, & Fridays (Patient taking differently: Take 20 mg by mouth every Monday, Wednesday, and Friday.)   gabapentin  (NEURONTIN) 300 MG capsule TAKE 1 CAPSULE BY MOUTH TWICE DAILY FOR PAIN   Glucagon (GVOKE HYPOPEN 2-PACK) 1 MG/0.2ML SOAJ Inject 1 mg (1 pen) as needed for severe low blood sugar (sustained glucose less than 55 despite oral glucose treatments). May repeat in 15 minutes as needed.   insulin aspart (NOVOLOG FLEXPEN) 100 UNIT/ML FlexPen INJECT 6-16 UNITS  THREE TIMES DAILY WITH MEALS PER  SLIDING  SCALE  FOR  DIABETES (Patient taking differently: Inject 5 Units into the skin 2 (two) times daily with a meal.)   Insulin Pen Needle (INSUPEN PEN NEEDLES) 32G X 4 MM MISC Use to inject insulin 4 times daily.   isosorbide mononitrate (IMDUR) 30 MG 24 hr tablet Take 30 mg by mouth daily.   levothyroxine (SYNTHROID) 100 MCG tablet TAKE 1 TABLET BY MOUTH IN THE MORNING ON  AN  EMPTY  STOMACH  WITH  WATER  ONLY.  NO  FOOD  OR  OTHER  MEDICATIONS  FOR  30  MINUTES   mirtazapine (REMERON) 15 MG tablet Take 1 tablet (15 mg total) by mouth at bedtime. For sleep and depression (Patient taking differently: Take 15 mg by mouth at bedtime as needed (sleep).)   omeprazole (PRILOSEC) 40 MG capsule Take 1 capsule (40 mg total) by mouth daily.   ondansetron (ZOFRAN) 4 MG tablet Take 1 tablet (4 mg total) by mouth every 6 (six) hours as needed for nausea.   potassium chloride (KLOR-CON M) 10 MEQ tablet Only take on days you take your lasix. Take 1 tablet (10 mEq total) by mouth for 1 dose on those days you take your lasix 20mg  pill. (Patient taking differently: Take 10 mEq by mouth every Monday, Wednesday, and Friday.)   sitaGLIPtin (JANUVIA) 100 MG tablet Take 1 tablet (100 mg total) by mouth daily. for diabetes.   No facility-administered encounter medications on file as of 10/22/2023.    Allergies (verified) Patient has no known allergies.   History: Past Medical History:  Diagnosis Date   Acute pain of left shoulder 01/17/2019   Aortic atherosclerosis (HCC)    CHF (congestive heart failure) (HCC) 09/16/2013   a.) TTE  09/16/2013: EF 55-60%; mod inferior HK, triv TR; G1DD. b.) TTE 01/02/2016: EF 25-30%; mid-apicalateroseptal, lat, inf, inferoseptal, apical akinesis; triv TR; G1DD. c. TTE 04/16/2016: EF 65%; G1DD. d.) TTE 05/02/2020: EF 50-55%; G2DD. e.) TTE 04/11/2021: EF 55%; G1DD.   Chronic low back pain with bilateral sciatica    Chronic painful diabetic neuropathy (HCC)    Chronic radicular lumbar pain    CKD (chronic kidney disease), stage III (HCC)    Clostridioides difficile infection 2015   COPD (chronic obstructive pulmonary disease) (HCC)    Coronary artery disease    a.) PCI 02/06/2011: 100% mLCx - DES x 3. b.) PCI 09/17/2011: 95% dRCA - DES x 1. c.) PCI 10/02/2011: 75% pLCX - DES x 1. d.) PCI 08/19/2012: DES x 2 p-mRCA.  e.) LHC 01/01/16:  Takotsubo event 01/01/2016 with patent stents   Depression    Diverticulosis    Failed back syndrome    Frequent PVCs    a. Noted in hospital 12/2015.   GERD (gastroesophageal reflux disease)    History of bilateral cataract extraction 01/2015   History of heart artery stent    a.) TOTAL of 7 stents --> 02/06/2011 - overlapping 2.5 x 12 mm Xience V, 2.5 x 8 mm Xience V, 2.25 x 8 mm Xience Nano to mLCx; 09/17/2011 - 2.5 x 23 Xience V dRCA; 10/02/2011 - 2.5 x 15 mm Xience V pLCx; 08/09/2012 - overlapping 3.0 x 38mm mRCA and 3.5 x 23mm pRCA   Hyperlipidemia    Hypertension    Hypothyroidism    Impingement syndrome of left shoulder    LBBB (left bundle branch block)    Long term current use of clopidogrel    Marijuana abuse    Myocardial infarction 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 12/2015 - nephew committed suicide 1 week prior, sister died the morning of presentation - initially called a STEMI; cath with patent stents. LVEF 25-30%.   Tendonitis of left rotator cuff    Tobacco abuse    Vaginal burning 03/22/2019   Vascular dementia    Past Surgical History:  Procedure Laterality Date   ABDOMINAL AORTOGRAM W/LOWER EXTREMITY N/A 09/12/2020   Procedure: ABDOMINAL AORTOGRAM W/LOWER EXTREMITY;  Surgeon: Iran Ouch, MD;  Location: [facility] INVASIVE CV LAB;  Service: Cardiovascular;  Laterality: N/A;   ABDOMINAL HYSTERECTOMY     APPENDECTOMY     BACK SURGERY     CARDIAC CATHETERIZATION N/A 01/01/2016   Procedure: Left Heart Cath and Coronary Angiography;  Surgeon: Corky Crafts, MD;  Location: [facility] INVASIVE CV LAB;  Service: Cardiovascular;  Laterality: N/A;   CATARACT EXTRACTION W/PHACO Left 01/30/2015   Procedure: CATARACT EXTRACTION PHACO AND INTRAOCULAR LENS PLACEMENT (IOC);  Surgeon: Galen Manila, MD;  Location: [facility] ORS;  Service: Ophthalmology;  Laterality: Left;  [place] 00:47    CATARACT EXTRACTION W/PHACO Right 02/13/2015   Procedure: CATARACT EXTRACTION PHACO AND INTRAOCULAR LENS PLACEMENT (IOC);  Surgeon: Galen Manila, MD;  Location: [facility] ORS;  Service: Ophthalmology;  Laterality: Right;  cassette lot # [id number] H [place]  00:29.9 AP  20.7 CDE  6.20   COLONOSCOPY N/A 11/02/2014   Procedure: COLONOSCOPY;  Surgeon: Louis Meckel, MD;  Location: [facility] ENDOSCOPY;  Service: Endoscopy;  Laterality: N/A;   CORONARY ANGIOPLASTY WITH STENT PLACEMENT Left 02/06/2011   Procedure: CORONARY ANGIOPLASTY WITH STENT PLACEMENT; Location: [facility]; Surgeon: Rudean Hitt, MD   CORONARY ANGIOPLASTY WITH STENT PLACEMENT Left 09/17/2011   Procedure: CORONARY ANGIOPLASTY WITH STENT PLACEMENT; Location: [facility]; Surgeon: Rudean Hitt, MD   CORONARY ANGIOPLASTY WITH STENT PLACEMENT Left 10/02/2011    Procedure: CORONARY ANGIOPLASTY WITH STENT PLACEMENT; Location: [facility]; Surgeon: Rudean Hitt, MD   CORONARY ANGIOPLASTY WITH STENT PLACEMENT Left 08/19/2012  Procedure: CORONARY ANGIOPLASTY WITH STENT PLACEMENT; Location: [facility]; Surgeon: Lorine Bears, MD   ESOPHAGOGASTRODUODENOSCOPY (EGD) WITH PROPOFOL N/A 09/21/2018   Procedure: ESOPHAGOGASTRODUODENOSCOPY (EGD) WITH PROPOFOL;  Surgeon: Wyline Mood, MD;  Location: [facility] ENDOSCOPY;  Service: Gastroenterology;  Laterality: N/A;   ESOPHAGOGASTRODUODENOSCOPY (EGD) WITH PROPOFOL N/A 10/06/2022   Procedure: ESOPHAGOGASTRODUODENOSCOPY (EGD) WITH PROPOFOL;  Surgeon: Wyline Mood, MD;  Location: [facility] ENDOSCOPY;  Service: Gastroenterology;  Laterality: N/A;   EYE SURGERY     LEFT HEART CATH AND CORONARY ANGIOGRAPHY Left 08/02/2014   Procedure: LEFT HEART CATH AND CORONARY ANGIOGRAPHY; Location: [facility]; Surgeon: Bryan Lemma, MD   PERIPHERAL VASCULAR ATHERECTOMY Left 09/12/2020   Procedure: PERIPHERAL VASCULAR ATHERECTOMY;  Surgeon: Iran Ouch, MD;  Location: [facility] INVASIVE CV LAB;  Service: Cardiovascular;  Laterality: Left;   PERIPHERAL VASCULAR BALLOON ANGIOPLASTY  09/12/2020   Procedure: PERIPHERAL VASCULAR BALLOON ANGIOPLASTY;  Surgeon: Iran Ouch, MD;  Location: [facility] INVASIVE CV LAB;  Service: Cardiovascular;;   REVERSE SHOULDER ARTHROPLASTY Left 10/01/2021   Procedure: REVERSE SHOULDER ARTHROPLASTY WITH BICEPS TENODESIS.;  Surgeon: Christena Flake, MD;  Location: [facility] ORS;  Service: Orthopedics;  Laterality: Left;   SHOULDER SURGERY Left 2017   Family History  Problem Relation Age of Onset   Heart attack Mother        First MI @ 44 - Died @ 66   Heart disease Mother    Heart disease Father        Died @ 75   Throat cancer Brother    Liver cancer Brother    Colon cancer Sister    Social History   Socioeconomic History   Marital status: Married    Spouse name: Alfredo Bach   Number of children: Not on file   Years of education: Not  on file   Highest education level: Not on file  Occupational History   Not on file  Tobacco Use   Smoking status: Every Day    Current packs/day: 1.00    Average packs/day: 1 pack/day for 45.0 years (45.0 ttl pk-yrs)    Types: Cigarettes   Smokeless tobacco: Never   Tobacco comments:    Has cut back, trying to quit.   Vaping Use   Vaping status: Some Days   Substances: Nicotine  Substance and Sexual Activity   Alcohol use: No    Alcohol/week: 0.0 standard drinks of alcohol   Drug use: Yes    Types: Marijuana    Comment: last noc   Sexual activity: Not on file  Other Topics Concern   Not on file  Social History Narrative   Lives at home with her husband in [place].  Previously used marijuana - quit.      Regular exercise: no/ pain from a frozen rotator cuff   Caffeine use: coffee daily and pepsi      Does not have a living will.   Daughters and husband know her wishes- would desire CPR but not prolonged life support if futile   Social Drivers of Corporate investment banker Strain: Low Risk  (10/22/2023)   Overall Financial Resource Strain (CARDIA)    Difficulty of Paying Living Expenses: Not hard at all  Food Insecurity: No Food Insecurity (10/22/2023)   Hunger Vital Sign    Worried About Running Out of Food in the Last Year: Never true    Ran Out of Food in the Last Year: Never true  Transportation Needs: No Transportation Needs (10/22/2023)   PRAPARE - Transportation  Lack of Transportation (Medical): No    Lack of Transportation (Non-Medical): No  Physical Activity: Insufficiently Active (10/22/2023)   Exercise Vital Sign    Days of Exercise per Week: 3 days    Minutes of Exercise per Session: 20 min  Stress: No Stress Concern Present (10/22/2023)   [employer] of Occupational Health - Occupational Stress Questionnaire    Feeling of Stress : Only a little  Social Connections: Moderately Isolated (10/22/2023)   Social Connection and Isolation Panel [NHANES]     Frequency of Communication with Friends and Family: Twice a week    Frequency of Social Gatherings with Friends and Family: Once a week    Attends Religious Services: Never    Database administrator or Organizations: No    Attends Engineer, structural: Never    Marital Status: Married    Tobacco Counseling Ready to quit: Not Answered Counseling given: Not Answered Tobacco comments: Has cut back, trying to quit.     Clinical Intake:  Pre-visit preparation completed: Yes  Pain : 0-10 Pain Score: 6  Pain Type: Chronic pain Pain Location: Back Pain Orientation: Lower, Mid Pain Descriptors / Indicators: Constant Pain Onset: Today Pain Frequency: Constant Effect of Pain on Daily Activities: medication     BMI - recorded: 20.18 Nutritional Status: BMI of 19-24  Normal Nutritional Risks: None Diabetes: Yes CBG done?: No Did pt. bring in CBG monitor from home?: No  Lab Results  Component Value Date   HGBA1C 8.5 (A) 09/03/2023   HGBA1C 8.0 (A) 06/03/2023   HGBA1C 8.1 (A) 03/03/2023     How often do you need to have someone help you when you read instructions, pamphlets, or other written materials from your doctor or pharmacy?: 1 - Never What is the last grade level you completed in school?: high school diploma  Interpreter Needed?: No  Information entered by :: [employer]   Activities of Daily Living     10/22/2023    1:08 PM 10/19/2023    8:55 AM  In your present state of health, do you have any difficulty performing the following activities:  Hearing? 0   Vision? 0   Difficulty concentrating or making decisions? 1   Walking or climbing stairs? 0   Dressing or bathing? 0   Doing errands, shopping? 0 0  Preparing Food and eating ? N   Using the Toilet? N   In the past six months, have you accidently leaked urine? Y   Do you have problems with loss of bowel control? Y   Managing your Medications? N   Managing your Finances? N   Housekeeping  or managing your Housekeeping? N     Patient Care Team: Doreene Nest, NP as PCP - General (Internal Medicine) Mariah Milling Tollie Pizza, MD as PCP - Cardiology (Cardiology) Antonieta Iba, MD (Cardiology) Louis Meckel, MD (Inactive) as Consulting Physician (Gastroenterology) Venetia Night, MD as Consulting Physician (Neurosurgery) Carlus Pavlov, MD as Consulting Physician (Internal Medicine) Mariah Milling Tollie Pizza, MD as Consulting Physician (Cardiology) Galen Manila, MD as Referring Physician (Ophthalmology) Otho Ket, RN as [facility] Care Management  Indicate any recent Medical Services you may have received from other than [facility] providers in the past year (date may be approximate).     Assessment:   This is a routine wellness examination for Sarah Payne.  Hearing/Vision screen Hearing Screening - Comments:: No problems hearing  Vision Screening - Comments:: Patient wears glasses    Goals  Addressed             This Visit's Progress    Patient Stated       Patient wants to not have so much pain       Depression Screen     10/22/2023    4:09 PM 09/03/2023    9:37 AM 08/19/2023    8:51 AM 06/17/2023    1:14 PM 06/03/2023    9:35 AM 05/06/2023    9:49 AM 04/09/2023    1:31 PM  PHQ 2/9 Scores  PHQ - 2 Score 0 1 0 3 3 0 0  PHQ- 9 Score 0   13 14      Fall Risk     10/22/2023    1:07 PM 09/03/2023    9:37 AM 08/19/2023    8:51 AM 08/12/2023    9:05 AM 06/17/2023    1:14 PM  Fall Risk   Falls in the past year? 0 0 0 1 0  Number falls in past yr: 0 0  1   Injury with Fall? 0 0  1   Comment    bruised tailbone. Had x-ray and nothing was broken- per patient.   Risk for fall due to : No Fall Risks No Fall Risks  Impaired balance/gait   Risk for fall due to: Comment    slipped on ice   Follow up Falls prevention discussed;Falls evaluation completed Falls evaluation completed       MEDICARE RISK AT HOME:  Medicare Risk at Home Any stairs in  or around the home?: Yes If so, are there any without handrails?: No Home free of loose throw rugs in walkways, pet beds, electrical cords, etc?: Yes Adequate lighting in your home to reduce risk of falls?: Yes Life alert?: No Use of a cane, walker or w/c?: No Grab bars in the bathroom?: Yes Shower chair or bench in shower?: Yes Elevated toilet seat or a handicapped toilet?: Yes  TIMED UP AND GO:  Was the test performed?  No  Cognitive Function: 6CIT completed    02/06/2021   10:33 AM 11/17/2017   10:22 AM  MMSE - Mini Mental State Exam  Not completed: Refused   Orientation to time  5  Orientation to Place  5  Registration  3  Attention/ Calculation  0  Recall  3  Language- name 2 objects  0  Language- repeat  1  Language- follow 3 step command  3  Language- read & follow direction  0  Write a sentence  0  Copy design  0  Total score  20        10/22/2023    1:05 PM 08/06/2022    9:08 AM  6CIT Screen  What Year? 0 points 0 points  What month? 0 points 0 points  What time? 0 points 0 points  Count back from 20 0 points 0 points  Months in reverse 0 points 0 points  Repeat phrase 8 points 0 points  Total Score 8 points 0 points    Immunizations Immunization History  Administered Date(s) Administered   Fluad Quad(high Dose 65+) 04/02/2020, 04/16/2022   Influenza Split 05/20/2012   Influenza, High Dose Seasonal PF 04/17/2016, 04/17/2017, 05/04/2018, 06/03/2023   Influenza, Seasonal, Injecte, Preservative Fre 05/07/2006   Influenza,inj,Quad PF,6+ Mos 04/20/2013, 04/26/2014, 06/01/2015, 04/25/2019   Moderna Covid-19 Fall Seasonal Vaccine 73yrs & older 05/01/2022, 06/03/2023   PFIZER(Purple Top)SARS-COV-2 Vaccination 09/15/2019, 10/11/2019, 06/05/2020   Pfizer(Comirnaty)Fall Seasonal Vaccine 12 years  and older 05/01/2022   Pneumococcal Conjugate-13 04/26/2014   Pneumococcal Polysaccharide-23 03/28/2013, 04/25/2019   Tdap 03/28/2013, 06/03/2023   Zoster  Recombinant(Shingrix) 02/15/2021, 01/07/2022    Screening Tests Health Maintenance  Topic Date Due   Lung Cancer Screening  09/04/2023   OPHTHALMOLOGY EXAM  11/20/2023 (Originally 10/10/2021)   INFLUENZA VACCINE  02/19/2024   HEMOGLOBIN A1C  03/02/2024   FOOT EXAM  06/02/2024   Diabetic kidney evaluation - Urine ACR  09/02/2024   Diabetic kidney evaluation - eGFR measurement  10/18/2024   Medicare Annual Wellness (AWV)  10/21/2024   Colonoscopy  11/01/2024   DTaP/Tdap/Td (3 - Td or Tdap) 06/02/2033   Pneumonia Vaccine 48+ Years old  Completed   DEXA SCAN  Completed   Hepatitis C Screening  Completed   Zoster Vaccines- Shingrix  Completed   HPV VACCINES  Aged Out   COVID-19 Vaccine  Discontinued    Health Maintenance  Health Maintenance Due  Topic Date Due   Lung Cancer Screening  09/04/2023   Health Maintenance Items Addressed:lung screening declined   Additional Screening:  Vision Screening: Recommended annual ophthalmology exams for early detection of glaucoma and other disorders of the eye.  Dental Screening: Recommended annual dental exams for proper oral hygiene  Community Resource Referral / Chronic Care Management: CRR required this visit?  No   CCM required this visit?  No     Plan:     I have personally reviewed and noted the following in the patient's chart:   Medical and social history Use of alcohol, tobacco or illicit drugs  Current medications and supplements including opioid prescriptions. Patient is not currently taking opioid prescriptions. Functional ability and status Nutritional status Physical activity Advanced directives List of other physicians Hospitalizations, surgeries, and ER visits in previous 12 months Vitals Screenings to include cognitive, depression, and falls Referrals and appointments  In addition, I have reviewed and discussed with patient certain preventive protocols, quality metrics, and best practice recommendations.  A written personalized care plan for preventive services as well as general preventive health recommendations were provided to patient.     Rudi Heap, [place]   10/22/2023   After Visit Summary: (MyChart) Due to this being a telephonic visit, the after visit summary with patients personalized plan was offered to patient via MyChart   Notes: Nothing significant to report at this time.

## 2023-10-27 ENCOUNTER — Ambulatory Visit
Admission: RE | Admit: 2023-10-27 | Discharge: 2023-10-27 | Disposition: A | Discharge status: Home / Self Care | Attending: Neurosurgery | Admitting: Neurosurgery

## 2023-10-27 ENCOUNTER — Ambulatory Visit: Payer: Self-pay | Admitting: Urgent Care

## 2023-10-27 ENCOUNTER — Other Ambulatory Visit: Payer: Self-pay

## 2023-10-27 ENCOUNTER — Encounter: Payer: Self-pay | Admitting: Neurosurgery

## 2023-10-27 ENCOUNTER — Encounter
Admission: RE | Disposition: A | Discharge status: Home / Self Care | Payer: Self-pay | Source: Home / Self Care | Attending: Neurosurgery

## 2023-10-27 ENCOUNTER — Ambulatory Visit

## 2023-10-27 DIAGNOSIS — I252 Old myocardial infarction: Secondary | ICD-10-CM | POA: Insufficient documentation

## 2023-10-27 DIAGNOSIS — E785 Hyperlipidemia, unspecified: Secondary | ICD-10-CM | POA: Insufficient documentation

## 2023-10-27 DIAGNOSIS — I25110 Atherosclerotic heart disease of native coronary artery with unstable angina pectoris: Secondary | ICD-10-CM | POA: Diagnosis not present

## 2023-10-27 DIAGNOSIS — I493 Ventricular premature depolarization: Secondary | ICD-10-CM | POA: Insufficient documentation

## 2023-10-27 DIAGNOSIS — I447 Left bundle-branch block, unspecified: Secondary | ICD-10-CM | POA: Diagnosis not present

## 2023-10-27 DIAGNOSIS — M5441 Lumbago with sciatica, right side: Secondary | ICD-10-CM

## 2023-10-27 DIAGNOSIS — I509 Heart failure, unspecified: Secondary | ICD-10-CM | POA: Diagnosis not present

## 2023-10-27 DIAGNOSIS — Z7902 Long term (current) use of antithrombotics/antiplatelets: Secondary | ICD-10-CM | POA: Insufficient documentation

## 2023-10-27 DIAGNOSIS — E1151 Type 2 diabetes mellitus with diabetic peripheral angiopathy without gangrene: Secondary | ICD-10-CM

## 2023-10-27 DIAGNOSIS — N1832 Chronic kidney disease, stage 3b: Secondary | ICD-10-CM | POA: Diagnosis not present

## 2023-10-27 DIAGNOSIS — I700 Atherosclerosis of aorta: Secondary | ICD-10-CM | POA: Insufficient documentation

## 2023-10-27 DIAGNOSIS — K219 Gastro-esophageal reflux disease without esophagitis: Secondary | ICD-10-CM | POA: Diagnosis not present

## 2023-10-27 DIAGNOSIS — E1140 Type 2 diabetes mellitus with diabetic neuropathy, unspecified: Secondary | ICD-10-CM | POA: Diagnosis not present

## 2023-10-27 DIAGNOSIS — E1122 Type 2 diabetes mellitus with diabetic chronic kidney disease: Secondary | ICD-10-CM | POA: Diagnosis not present

## 2023-10-27 DIAGNOSIS — F0153 Vascular dementia, unspecified severity, with mood disturbance: Secondary | ICD-10-CM | POA: Insufficient documentation

## 2023-10-27 DIAGNOSIS — M5416 Radiculopathy, lumbar region: Secondary | ICD-10-CM

## 2023-10-27 DIAGNOSIS — I130 Hypertensive heart and chronic kidney disease with heart failure and stage 1 through stage 4 chronic kidney disease, or unspecified chronic kidney disease: Secondary | ICD-10-CM | POA: Insufficient documentation

## 2023-10-27 DIAGNOSIS — E039 Hypothyroidism, unspecified: Secondary | ICD-10-CM | POA: Diagnosis not present

## 2023-10-27 DIAGNOSIS — M5116 Intervertebral disc disorders with radiculopathy, lumbar region: Secondary | ICD-10-CM | POA: Insufficient documentation

## 2023-10-27 DIAGNOSIS — J449 Chronic obstructive pulmonary disease, unspecified: Secondary | ICD-10-CM | POA: Diagnosis not present

## 2023-10-27 DIAGNOSIS — M5442 Lumbago with sciatica, left side: Secondary | ICD-10-CM

## 2023-10-27 DIAGNOSIS — M961 Postlaminectomy syndrome, not elsewhere classified: Secondary | ICD-10-CM | POA: Diagnosis present

## 2023-10-27 DIAGNOSIS — F17210 Nicotine dependence, cigarettes, uncomplicated: Secondary | ICD-10-CM | POA: Insufficient documentation

## 2023-10-27 DIAGNOSIS — Z01812 Encounter for preprocedural laboratory examination: Secondary | ICD-10-CM

## 2023-10-27 DIAGNOSIS — E1142 Type 2 diabetes mellitus with diabetic polyneuropathy: Secondary | ICD-10-CM | POA: Insufficient documentation

## 2023-10-27 DIAGNOSIS — Z79899 Other long term (current) drug therapy: Secondary | ICD-10-CM | POA: Insufficient documentation

## 2023-10-27 DIAGNOSIS — Z01818 Encounter for other preprocedural examination: Secondary | ICD-10-CM

## 2023-10-27 DIAGNOSIS — Z794 Long term (current) use of insulin: Secondary | ICD-10-CM | POA: Insufficient documentation

## 2023-10-27 DIAGNOSIS — I471 Supraventricular tachycardia, unspecified: Secondary | ICD-10-CM | POA: Insufficient documentation

## 2023-10-27 DIAGNOSIS — Z981 Arthrodesis status: Secondary | ICD-10-CM

## 2023-10-27 DIAGNOSIS — G8929 Other chronic pain: Secondary | ICD-10-CM | POA: Diagnosis present

## 2023-10-27 DIAGNOSIS — G4733 Obstructive sleep apnea (adult) (pediatric): Secondary | ICD-10-CM | POA: Diagnosis not present

## 2023-10-27 DIAGNOSIS — M545 Low back pain, unspecified: Secondary | ICD-10-CM | POA: Diagnosis not present

## 2023-10-27 HISTORY — DX: Calculus of kidney: N20.0

## 2023-10-27 HISTORY — DX: Type 2 diabetes mellitus with diabetic neuropathy, unspecified: E11.40

## 2023-10-27 HISTORY — DX: Reaction to severe stress, unspecified: F43.9

## 2023-10-27 HISTORY — PX: THORACIC LAMINECTOMY FOR SPINAL CORD STIMULATOR: SHX6887

## 2023-10-27 HISTORY — DX: Other chronic pain: G89.29

## 2023-10-27 HISTORY — DX: Postlaminectomy syndrome, not elsewhere classified: M96.1

## 2023-10-27 HISTORY — DX: Long term (current) use of antithrombotics/antiplatelets: Z79.02

## 2023-10-27 HISTORY — DX: Chronic kidney disease, stage 3 unspecified: N18.30

## 2023-10-27 HISTORY — DX: Cyst of kidney, acquired: N28.1

## 2023-10-27 LAB — GLUCOSE, CAPILLARY
Glucose-Capillary: 165 mg/dL — ABNORMAL HIGH (ref 70–99)
Glucose-Capillary: 197 mg/dL — ABNORMAL HIGH (ref 70–99)

## 2023-10-27 SURGERY — THORACIC LAMINECTOMY FOR SPINAL CORD STIMULATOR
Anesthesia: General

## 2023-10-27 MED ORDER — CEFAZOLIN SODIUM-DEXTROSE 2-4 GM/100ML-% IV SOLN
2.0000 g | INTRAVENOUS | Status: AC
  Administered 2023-10-27: 2 g via INTRAVENOUS

## 2023-10-27 MED ORDER — ORAL CARE MOUTH RINSE: 15.0000 mL | Freq: Once | OROMUCOSAL | Status: AC

## 2023-10-27 MED ORDER — CEFAZOLIN IN SODIUM CHLORIDE 2-0.9 GM/100ML-% IV SOLN
2.0000 g | Freq: Once | INTRAVENOUS | Status: DC
  Filled 2023-10-27: qty 100

## 2023-10-27 MED ORDER — SODIUM CHLORIDE 0.9 % IV SOLN: INTRAVENOUS | Status: DC

## 2023-10-27 MED ORDER — SUCCINYLCHOLINE CHLORIDE 200 MG/10ML IV SOSY
PREFILLED_SYRINGE | INTRAVENOUS | Status: DC | PRN
  Administered 2023-10-27: 60 mg via INTRAVENOUS

## 2023-10-27 MED ORDER — SODIUM CHLORIDE 0.9 % IV SOLN
INTRAVENOUS | Status: DC | PRN
  Administered 2023-10-27: .1 ug/kg/min via INTRAVENOUS

## 2023-10-27 MED ORDER — LIDOCAINE HCL (CARDIAC) PF 100 MG/5ML IV SOSY
PREFILLED_SYRINGE | INTRAVENOUS | Status: DC | PRN
  Administered 2023-10-27: 100 mg via INTRAVENOUS

## 2023-10-27 MED ORDER — DROPERIDOL 2.5 MG/ML IJ SOLN
0.6250 mg | Freq: Once | INTRAMUSCULAR | Status: AC
  Administered 2023-10-27: 0.625 mg via INTRAVENOUS

## 2023-10-27 MED ORDER — ACETAMINOPHEN 10 MG/ML IV SOLN
INTRAVENOUS | Status: DC | PRN
  Administered 2023-10-27: 1000 mg via INTRAVENOUS

## 2023-10-27 MED ORDER — BUPIVACAINE-EPINEPHRINE (PF) 0.5% -1:200000 IJ SOLN
INTRAMUSCULAR | Status: AC
  Filled 2023-10-27: qty 20

## 2023-10-27 MED ORDER — OXYCODONE HCL 5 MG/5ML PO SOLN: 5.0000 mg | Freq: Once | ORAL | Status: AC | PRN

## 2023-10-27 MED ORDER — OXYCODONE HCL 5 MG PO TABS
5.0000 mg | ORAL_TABLET | Freq: Once | ORAL | Status: AC | PRN
  Administered 2023-10-27: 5 mg via ORAL

## 2023-10-27 MED ORDER — OXYCODONE HCL 5 MG PO TABS
ORAL_TABLET | ORAL | Status: AC
  Filled 2023-10-27: qty 1

## 2023-10-27 MED ORDER — DROPERIDOL 2.5 MG/ML IJ SOLN
INTRAMUSCULAR | Status: AC
  Filled 2023-10-27: qty 2

## 2023-10-27 MED ORDER — SODIUM CHLORIDE (PF) 0.9 % IJ SOLN
INTRAMUSCULAR | Status: DC | PRN
  Administered 2023-10-27: 40 mL

## 2023-10-27 MED ORDER — PHENYLEPHRINE HCL-NACL 20-0.9 MG/250ML-% IV SOLN
INTRAVENOUS | Status: DC | PRN
  Administered 2023-10-27: 10 ug/min via INTRAVENOUS

## 2023-10-27 MED ORDER — VANCOMYCIN HCL IN DEXTROSE 1-5 GM/200ML-% IV SOLN
INTRAVENOUS | Status: AC
  Filled 2023-10-27: qty 200

## 2023-10-27 MED ORDER — SODIUM CHLORIDE (PF) 0.9 % IJ SOLN
INTRAMUSCULAR | Status: AC
  Filled 2023-10-27: qty 20

## 2023-10-27 MED ORDER — 0.9 % SODIUM CHLORIDE (POUR BTL) OPTIME
TOPICAL | Status: DC | PRN
  Administered 2023-10-27: 500 mL

## 2023-10-27 MED ORDER — FENTANYL CITRATE (PF) 100 MCG/2ML IJ SOLN
25.0000 ug | INTRAMUSCULAR | Status: DC | PRN
  Administered 2023-10-27 (×4): 25 ug via INTRAVENOUS

## 2023-10-27 MED ORDER — LACTATED RINGERS IV SOLN: INTRAVENOUS | Status: DC | PRN

## 2023-10-27 MED ORDER — REMIFENTANIL HCL 1 MG IV SOLR
INTRAVENOUS | Status: AC
  Filled 2023-10-27: qty 1000

## 2023-10-27 MED ORDER — VANCOMYCIN HCL IN DEXTROSE 1-5 GM/200ML-% IV SOLN
1000.0000 mg | Freq: Once | INTRAVENOUS | Status: AC
  Administered 2023-10-27: 1000 mg via INTRAVENOUS

## 2023-10-27 MED ORDER — ACETAMINOPHEN 10 MG/ML IV SOLN: 1000.0000 mg | Freq: Once | INTRAVENOUS | Status: DC | PRN

## 2023-10-27 MED ORDER — CHLORHEXIDINE GLUCONATE 0.12 % MT SOLN
15.0000 mL | Freq: Once | OROMUCOSAL | Status: AC
  Administered 2023-10-27: 15 mL via OROMUCOSAL

## 2023-10-27 MED ORDER — SURGIFLO WITH THROMBIN (HEMOSTATIC MATRIX KIT) OPTIME
TOPICAL | Status: DC | PRN
  Administered 2023-10-27: 1 via TOPICAL

## 2023-10-27 MED ORDER — ONDANSETRON HCL 4 MG/2ML IJ SOLN
INTRAMUSCULAR | Status: AC
  Filled 2023-10-27: qty 2

## 2023-10-27 MED ORDER — DOCUSATE SODIUM 100 MG PO CAPS: 100.0000 mg | ORAL_CAPSULE | Freq: Two times a day (BID) | ORAL | 0 refills | Status: DC

## 2023-10-27 MED ORDER — CHLORHEXIDINE GLUCONATE 0.12 % MT SOLN
OROMUCOSAL | Status: AC
  Filled 2023-10-27: qty 15

## 2023-10-27 MED ORDER — DEXAMETHASONE SODIUM PHOSPHATE 10 MG/ML IJ SOLN
INTRAMUSCULAR | Status: DC | PRN
  Administered 2023-10-27: 10 mg via INTRAVENOUS

## 2023-10-27 MED ORDER — BUPIVACAINE LIPOSOME 1.3 % IJ SUSP
INTRAMUSCULAR | Status: AC
  Filled 2023-10-27: qty 20

## 2023-10-27 MED ORDER — KETAMINE HCL 50 MG/5ML IJ SOSY
PREFILLED_SYRINGE | INTRAMUSCULAR | Status: DC | PRN
  Administered 2023-10-27: 20 mg via INTRAVENOUS

## 2023-10-27 MED ORDER — FENTANYL CITRATE (PF) 100 MCG/2ML IJ SOLN
INTRAMUSCULAR | Status: AC
  Filled 2023-10-27: qty 2

## 2023-10-27 MED ORDER — ONDANSETRON HCL 4 MG/2ML IJ SOLN
INTRAMUSCULAR | Status: DC | PRN
  Administered 2023-10-27: 4 mg via INTRAVENOUS

## 2023-10-27 MED ORDER — BUPIVACAINE HCL (PF) 0.5 % IJ SOLN
INTRAMUSCULAR | Status: AC
  Filled 2023-10-27: qty 30

## 2023-10-27 MED ORDER — GLYCOPYRROLATE 0.2 MG/ML IJ SOLN
INTRAMUSCULAR | Status: DC | PRN
  Administered 2023-10-27: .2 mg via INTRAVENOUS

## 2023-10-27 MED ORDER — IRRISEPT - 450ML BOTTLE WITH 0.05% CHG IN STERILE WATER, USP 99.95% OPTIME
TOPICAL | Status: DC | PRN
  Administered 2023-10-27: 450 mL via TOPICAL

## 2023-10-27 MED ORDER — OXYCODONE HCL 5 MG PO TABS: 5.0000 mg | ORAL_TABLET | ORAL | 0 refills | Status: DC | PRN

## 2023-10-27 MED ORDER — PROPOFOL 1000 MG/100ML IV EMUL
INTRAVENOUS | Status: AC
  Filled 2023-10-27: qty 100

## 2023-10-27 MED ORDER — SENNA 8.6 MG PO TABS: 1.0000 | ORAL_TABLET | Freq: Every day | ORAL | 0 refills | Status: DC

## 2023-10-27 MED ORDER — FENTANYL CITRATE (PF) 100 MCG/2ML IJ SOLN
INTRAMUSCULAR | Status: DC | PRN
  Administered 2023-10-27 (×2): 50 ug via INTRAVENOUS

## 2023-10-27 MED ORDER — PROPOFOL 10 MG/ML IV BOLUS
INTRAVENOUS | Status: DC | PRN
Start: 2023-10-27 — End: 2023-10-27
  Administered 2023-10-27: 150 ug/kg/min via INTRAVENOUS
  Administered 2023-10-27: 100 mg via INTRAVENOUS
  Administered 2023-10-27: 40 mg via INTRAVENOUS

## 2023-10-27 MED ORDER — BUPIVACAINE-EPINEPHRINE (PF) 0.5% -1:200000 IJ SOLN
INTRAMUSCULAR | Status: DC | PRN
  Administered 2023-10-27: 10 mL

## 2023-10-27 MED ORDER — ONDANSETRON HCL 4 MG/2ML IJ SOLN
4.0000 mg | Freq: Once | INTRAMUSCULAR | Status: AC | PRN
  Administered 2023-10-27: 4 mg via INTRAVENOUS

## 2023-10-27 MED ORDER — CEFAZOLIN SODIUM-DEXTROSE 2-4 GM/100ML-% IV SOLN
INTRAVENOUS | Status: AC
  Filled 2023-10-27: qty 100

## 2023-10-27 MED ORDER — KETAMINE HCL 50 MG/5ML IJ SOSY
PREFILLED_SYRINGE | INTRAMUSCULAR | Status: AC
  Filled 2023-10-27: qty 5

## 2023-10-27 MED ORDER — PHENYLEPHRINE HCL-NACL 20-0.9 MG/250ML-% IV SOLN
INTRAVENOUS | Status: AC
  Filled 2023-10-27: qty 250

## 2023-10-27 SURGICAL SUPPLY — 41 items
BUR NEURO DRILL SOFT 3.0X3.8M (BURR) ×1 IMPLANT
CABLE OR STIMULATOR 2X8 61 (WIRE) IMPLANT
CONTROL REMOTE FREELINK ALPHA (NEUROSURGERY SUPPLIES) IMPLANT
DERMABOND ADVANCED .7 DNX12 (GAUZE/BANDAGES/DRESSINGS) ×2 IMPLANT
DRAPE C-ARM XRAY 36X54 (DRAPES) ×1 IMPLANT
DRAPE LAPAROTOMY 100X77 ABD (DRAPES) ×1 IMPLANT
DRSG OPSITE POSTOP 4X6 (GAUZE/BANDAGES/DRESSINGS) IMPLANT
DRSG OPSITE POSTOP 4X8 (GAUZE/BANDAGES/DRESSINGS) IMPLANT
ELECT REM PT RETURN 9FT ADLT (ELECTROSURGICAL) ×1 IMPLANT
ELECTRODE REM PT RTRN 9FT ADLT (ELECTROSURGICAL) ×1 IMPLANT
ELEVATER PASSER (SPINAL CORD STIMULATOR) ×1 IMPLANT
FEE INTRAOP CADWELL SUPPLY NCS (MISCELLANEOUS) ×1 IMPLANT
FEE INTRAOP MONITOR IMPULS NCS (MISCELLANEOUS) ×1 IMPLANT
GLOVE BIOGEL PI IND STRL 8 (GLOVE) ×1 IMPLANT
GLOVE SRG 8 PF TXTR STRL LF DI (GLOVE) ×1 IMPLANT
GLOVE SURG SYN 7.5 E (GLOVE) ×1 IMPLANT
GLOVE SURG SYN 7.5 PF PI (GLOVE) ×1 IMPLANT
GOWN SRG XL LVL 3 NONREINFORCE (GOWNS) ×1 IMPLANT
INTRAOP CADWELL SUPPLY FEE NCS (MISCELLANEOUS) ×1 IMPLANT
INTRAOP MONITOR FEE IMPULS NCS (MISCELLANEOUS) ×1 IMPLANT
JET LAVAGE IRRISEPT WOUND (IRRIGATION / IRRIGATOR) ×1 IMPLANT
KIT CHARGING PRECISION NEURO (KITS) IMPLANT
KIT IPG ALPHA WAVEWRITER (Stimulator) IMPLANT
KIT SPINAL PRONEVIEW (KITS) ×1 IMPLANT
LAVAGE JET IRRISEPT WOUND (IRRIGATION / IRRIGATOR) ×1 IMPLANT
LEAD COVER EDGE 50CM STIM KIT (Lead) IMPLANT
MANIFOLD NEPTUNE II (INSTRUMENTS) ×1 IMPLANT
NDL SAFETY ECLIPSE 18X1.5 (NEEDLE) ×1 IMPLANT
NS IRRIG 500ML POUR BTL (IV SOLUTION) ×1 IMPLANT
PACK LAMINECTOMY ARMC (PACKS) ×1 IMPLANT
PAD ARMBOARD POSITIONER FOAM (MISCELLANEOUS) ×1 IMPLANT
PASSER ELEVATOR (SPINAL CORD STIMULATOR) IMPLANT
STAPLER SKIN PROX 35W (STAPLE) IMPLANT
SURGIFLO W/THROMBIN 8M KIT (HEMOSTASIS) ×1 IMPLANT
SUT SILK 2 0SH CR/8 30 (SUTURE) ×1 IMPLANT
SUT STRATA 3-0 15 PS-2 (SUTURE) IMPLANT
SUT VIC AB 0 CT1 18XCR BRD 8 (SUTURE) ×2 IMPLANT
SUT VIC AB 2-0 CT1 18 (SUTURE) ×2 IMPLANT
SYR 30ML LL (SYRINGE) ×2 IMPLANT
TOOL LONG TUNNEL (SPINAL CORD STIMULATOR) IMPLANT
TRAP FLUID SMOKE EVACUATOR (MISCELLANEOUS) ×1 IMPLANT

## 2023-10-27 NOTE — Op Note (Signed)
 Indications: the patient is a 78 yo female who was diagnosed with . The patient had a successful trial for spinal cord stimulation, so was consented for placement of a permanent device   Findings: successful placement of a thoracic paddle spinal cord stimulator.    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     Preoperative Note:    Risks of surgery discussed in clinic.   Operative Note:      The patient was then brought from the preoperative center with intravenous access established.  The patient underwent general anesthesia and endotracheal tube intubation, then was rotated on the [facility] table where all pressure points were appropriately padded.  An incision was marked with flouroscopy at T10/11, and on the right flank. The skin was then thoroughly cleansed.  Perioperative antibiotic prophylaxis was administered.  Sterile prep and drapes were then applied and a timeout was then observed.     Once this was complete an incision was opened with the use of a #10 blade knife in the midline at the thoracic incision.  The paraspinus muscled were subperiosteally dissected until the laminae of T10 and T11 were visualized. Flouroscopy was used to confirm the level. A self-retaining retractor was placed.   The rongeur was used to remove the spinous process of  T10.  The drill was used to thin the bone until the ligamentum flavum was visualized.  The ligamentum was then removed and the dura visualized. This was widened until placement of the paddle lead was possible.     The lead was then advanced to the T8 Body at the top of the lead. IONM showed that we had good lateralization with bilateral coverage. The lead was secured with a 2-0 silk suture.     The incision on the flank was then opened and a pocket formed until it was large enough for the pulse generator.  The tunneler was used to connect between the pocket and the incision.  The lead was inserted into the tunneler and tunneled to the buttock.  The leads were attached to the IPG and impedances checked.  The leads were then tightened.  The IPG was then inserted into the pouch.   Both sites were irrigated first irricept with then with irrigation  The wounds were closed in layers with 0 and 2-0 vicryl.  The skin was approximated with monocryl and glue. A sterile dressing was applied.   Monitoring was stable throughout.   Patient was then rotated back to the preoperative bed awakened from anesthesia and taken to recovery. All counts are correct in this case.  I performed this procedure without an assistant surgeon  Implant Name Type Inv. Item Serial No. Manufacturer Lot No. LRB No. Used Action  KIT IPG ALPHA WAVEWRITER - [id number] Stimulator KIT IPG ALPHA WAVEWRITER  [employer] [id number] N/A 1 Implanted  LEAD COVER EDGE 50CM STIM KIT - [id number] Lead LEAD COVER  EDGE 50CM STIM KIT  [employer] [id number] N/A 1 Implanted    Lovenia Kim, MD

## 2023-10-27 NOTE — Discharge Instructions (Addendum)
  Your surgeon has performed an operation on your spine to place a stimulator to help with your back pain.   Even if you feel well, it is important that you follow these activity guidelines. If you do not let your back heal properly from the surgery, you can increase the chance of hardware complications and/or return of your symptoms. The following are instructions to help in your recovery once you have been discharged from the hospital.   Activity    No bending, lifting, or twisting ("BLT"). Avoid lifting objects heavier than 10 pounds (gallon milk jug).  Where possible, avoid household activities that involve lifting, bending, pushing, or pulling such as laundry, vacuuming, grocery shopping, and childcare. Try to arrange for help from friends and family for these activities while your back heals.  Increase physical activity slowly as tolerated.  Taking short walks is encouraged, but avoid strenuous exercise. Do not jog, run, bicycle, lift weights, or participate in any other exercises unless specifically allowed by your doctor. Avoid prolonged sitting, including car rides.  Talk to your doctor before resuming sexual activity.  You should not drive until cleared by your doctor.  Until released by your doctor, you should not return to work or school.  You should rest at home and let your body heal.   You may shower three days after your surgery.  After showering, lightly dab your incision dry. Do not take a tub bath or go swimming for 3 weeks, or until approved by your doctor at your follow-up appointment.  If you smoke, we strongly recommend that you quit.  Smoking has been proven to interfere with normal healing in your back and will dramatically reduce the success rate of your surgery. Please contact [facility] (800-QUIT-NOW) and use the resources at www.[facility].com for assistance in stopping smoking.  Surgical Incision   If you have a dressing on your incision, you may remove it three  days after your surgery. Keep your incision area clean and dry.  Your incision was closed with Dermabond glue. The glue should begin to peel away within about a week.  Diet            You may return to your usual diet. Be sure to stay hydrated.  Your pain medication may cause constipation. Please use a stool softener and laxative to help prevent this.  When to Contact us  Although your surgery and recovery will likely be uneventful, you may have some residual numbness, aches, and pains in your back and/or legs. This is normal and should improve in the next few weeks.  However, should you experience any of the following, contact us immediately: New numbness or weakness Pain that is progressively getting worse, and is not relieved by your pain medications or rest Bleeding, redness, swelling, pain, or drainage from surgical incision Chills or flu-like symptoms Fever greater than 101.0 F (38.3 C) Problems with bowel or bladder functions Difficulty breathing or shortness of breath Warmth, tenderness, or swelling in your calf  Contact Information How to contact us:  If you have any questions/concerns before or after surgery, you can reach [place] at [phone], or you can send a mychart message. We can be reached by phone or mychart 8am-4pm, Monday-Friday.  *Please note: Calls after 4pm are forwarded to a third party answering service. Mychart messages are not routinely monitored during evenings, weekends, and holidays. Please call our office to contact the answering service for urgent concerns during non-business hours.

## 2023-10-27 NOTE — Progress Notes (Signed)
 Postop Note: Pt rec'd from PACU. Up to BR to void. C/O nausea and pain.  Msg'd Dr. Katrinka Blazing to assess dsg's on back.  Confirmed lower dsg was the stimulator and informed family it would be a little puffy.  Droperidol given per Dr. Alexis Frock order and teaching about pain control at home given.  Stimulator rep at bs interrogating device.

## 2023-10-27 NOTE — Transfer of Care (Signed)
 Immediate Anesthesia Transfer of Care Note  Patient: Sarah Payne  Procedure(s) Performed: THORACIC LAMINECTOMY FOR SPINAL CORD STIMULATOR PLACEMENT WITH INTERNAL PULSE GENERATOR IMPLANT  Patient Location: PACU  Anesthesia Type:General  Level of Consciousness: drowsy and patient cooperative  Airway & Oxygen Therapy: Patient Spontanous Breathing and Patient connected to face mask oxygen  Post-op Assessment: Report given to RN and Post -op Vital signs reviewed and stable  Post vital signs: stable  Last Vitals:  Vitals Value Taken Time  BP 191/86 10/27/23 0911  Temp    Pulse 70 10/27/23 0914  Resp 18 10/27/23 0914  SpO2 100 % 10/27/23 0914  Vitals shown include unfiled device data.  Last Pain:  Vitals:   10/27/23 0647  PainSc: 5          Complications: No notable events documented.

## 2023-10-27 NOTE — Consult Note (Signed)
 History of Present Illness: 10/27/23 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  She has a history of heart disease as well as smoking.  She had a recent spinal cord stimulator trial placement with Dr. Cherylann Ratel and had 100% recovery of her lower extremity pain.  She has had her psychotherapy evaluation as well as her MRI of her thoracic spine.  She is enthusiastic to move forward with a placement of a spinal cord stimulator.   The symptoms are causing a significant impact on the patient's life.    I have utilized the care everywhere function in epic to review the outside records available from external health systems, I have also reviewed the notes from Dr. Cherylann Ratel who has performed an extensive workup.  He is also performed an extensive treatment regimen which led to a spinal cord stimulator trial which had not 100% recovery of her pain.   Review of Systems:  A 10 point review of systems is negative, except for the pertinent positives and negatives detailed in the HPI.   Past Medical History: Past Medical History: Diagnosis Date  Acute pain of left shoulder 01/17/2019  Acute renal failure superimposed on stage 3b chronic kidney disease (HCC) 09/03/2022  Aortic atherosclerosis (HCC)    Bilateral cataracts 01/2015   a.) s/p extraction  CHF (congestive heart failure) (HCC) 09/16/2013   a.) TTE 09/16/2013: EF 55-60%; mod inferior HK, triv TR; G1DD. b.) TTE 01/02/2016: EF 25-30%; mid-apicalateroseptal, lat, inf, inferoseptal, apical akinesis; triv TR; G1DD. c. TTE 04/16/2016: EF 65%; G1DD. d.) TTE 05/02/2020: EF 50-55%; G2DD. e.) TTE 04/11/2021: EF 55%; G1DD.  Chronic low back pain    Clostridioides difficile infection 2015  COPD (chronic obstructive pulmonary disease) (HCC)    Coronary artery disease     a.) PCI 02/06/2011: 100% mLCx - DES x 3. b.)  PCI 09/17/2011: 95% dRCA - DES x 1. c.) PCI 10/02/2011: 75% pLCX - DES x 1. d.) PCI 08/19/2012: DES x 2 p-mRCA.  e.) LHC 01/01/16: Takotsubo event 01/01/2016 with patent stents  Depression    Diverticulosis    Frequent PVCs     a. Noted in hospital 12/2015.  GERD (gastroesophageal reflux disease)    History of heart artery stent     a.) TOTAL of 7 stents: a.) 02/06/2011 - overlapping 2.5x12 mm Xience V, 2.5x8 mm Xience V, 2.25x8 mm Xience Nano to mLCx. b.) 09/17/2011 - 2.5x23 Xience V dRCA. c.) 10/02/2011 - 2.5x15 mm Xience V pLCx. d.) 08/09/2012 - overlapping 3.0x8mm mRCA and 3.5x64mm pRCA  Hyperlipidemia    Hypertension    Hypothyroidism    Impingement syndrome of left shoulder    LBBB (left bundle branch block)    Left flank pain 10/28/2022  Long term current use of antithrombotics/antiplatelets     a.) clopidogrel  Marijuana abuse    Myocardial infarction [facility])     a.) multiple MIs;  5 per her report  OSA (obstructive sleep apnea)     a.) mild; does not require nocturnal PAP therapy  Pancreatitis 09/19/2022  Paroxysmal SVT (supraventricular tachycardia) (HCC) 05/08/2021   a.)  Zio patch 05/08/2021: 3 distinct SVT runs; fastest 5 beats at a rate of 146 bpm; longest 7 beats at rate of 134 bpm  T2DM (type 2 diabetes mellitus) (HCC)    Takotsubo cardiomyopathy     a. 12/2015 - nephew committed suicide 1 week prior, sister died the  morning of presentation - initially called a STEMI; cath with patent stents. LVEF 25-30%.  Tendonitis of left rotator cuff    Tobacco abuse    Vaginal burning 03/22/2019  Vascular dementia          Past Surgical History: Past Surgical History: Procedure Laterality Date  ABDOMINAL AORTOGRAM W/LOWER EXTREMITY N/A 09/12/2020   Procedure: ABDOMINAL AORTOGRAM W/LOWER EXTREMITY;  Surgeon: Iran Ouch, MD;  Location: [facility] INVASIVE CV LAB;  Service: Cardiovascular;  Laterality: N/A;  ABDOMINAL  HYSTERECTOMY      APPENDECTOMY      BACK SURGERY      CARDIAC CATHETERIZATION N/A 01/01/2016   Procedure: Left Heart Cath and Coronary Angiography;  Surgeon: Corky Crafts, MD;  Location: [facility] INVASIVE CV LAB;  Service: Cardiovascular;  Laterality: N/A;  CATARACT EXTRACTION W/PHACO Left 01/30/2015   Procedure: CATARACT EXTRACTION PHACO AND INTRAOCULAR LENS PLACEMENT (IOC);  Surgeon: Galen Manila, MD;  Location: [facility] ORS;  Service: Ophthalmology;  Laterality: Left;  [place] 00:47    CATARACT EXTRACTION W/PHACO Right 02/13/2015   Procedure: CATARACT EXTRACTION PHACO AND INTRAOCULAR LENS PLACEMENT (IOC);  Surgeon: Galen Manila, MD;  Location: [facility] ORS;  Service: Ophthalmology;  Laterality: Right;  cassette lot # [id number] H [place]  00:29.9 AP  20.7 CDE  6.20  COLONOSCOPY N/A 11/02/2014   Procedure: COLONOSCOPY;  Surgeon: Louis Meckel, MD;  Location: [facility] ENDOSCOPY;  Service: Endoscopy;  Laterality: N/A;  CORONARY ANGIOPLASTY WITH STENT PLACEMENT Left 02/06/2011   Procedure: CORONARY ANGIOPLASTY WITH STENT PLACEMENT; Location: [facility]; Surgeon: Rudean Hitt, MD  CORONARY ANGIOPLASTY WITH STENT PLACEMENT Left 09/17/2011   Procedure: CORONARY ANGIOPLASTY WITH STENT PLACEMENT; Location: [facility]; Surgeon: Rudean Hitt, MD  CORONARY ANGIOPLASTY WITH STENT PLACEMENT Left 10/02/2011   Procedure: CORONARY ANGIOPLASTY WITH STENT PLACEMENT; Location: [facility]; Surgeon: Rudean Hitt, MD  CORONARY ANGIOPLASTY WITH STENT PLACEMENT Left 08/19/2012   Procedure: CORONARY ANGIOPLASTY WITH STENT PLACEMENT; Location: [facility]; Surgeon: Lorine Bears, MD  ESOPHAGOGASTRODUODENOSCOPY (EGD) WITH PROPOFOL N/A 09/21/2018   Procedure: ESOPHAGOGASTRODUODENOSCOPY (EGD) WITH PROPOFOL;  Surgeon: Wyline Mood, MD;  Location: [facility] ENDOSCOPY;  Service: Gastroenterology;  Laterality: N/A;  ESOPHAGOGASTRODUODENOSCOPY (EGD) WITH PROPOFOL N/A 10/06/2022   Procedure:  ESOPHAGOGASTRODUODENOSCOPY (EGD) WITH PROPOFOL;  Surgeon: Wyline Mood, MD;  Location: [facility] ENDOSCOPY;  Service: Gastroenterology;  Laterality: N/A;  EYE SURGERY      LEFT HEART CATH AND CORONARY ANGIOGRAPHY Left 08/02/2014   Procedure: LEFT HEART CATH AND CORONARY ANGIOGRAPHY; Location: [facility]; Surgeon: Bryan Lemma, MD  PERIPHERAL VASCULAR ATHERECTOMY Left 09/12/2020   Procedure: PERIPHERAL VASCULAR ATHERECTOMY;  Surgeon: Iran Ouch, MD;  [facility] INVASIVE CV LAB;  Service: Cardiovascular;  Laterality: Left;  PERIPHERAL VASCULAR BALLOON ANGIOPLASTY   09/12/2020   Procedure: PERIPHERAL VASCULAR BALLOON ANGIOPLASTY;  Surgeon: Iran Ouch, MD;  Location: [facility] INVASIVE CV LAB;  Service: Cardiovascular;;  REVERSE SHOULDER ARTHROPLASTY Left 10/01/2021   Procedure: REVERSE SHOULDER ARTHROPLASTY WITH BICEPS TENODESIS.;  Surgeon: Christena Flake, MD;  Location: [facility] ORS;  Service: Orthopedics;  Laterality: Left;  SHOULDER SURGERY Left 2017        Allergies: Allergies as of 08/31/2023  (No Known Allergies)     Medications: Current Medications      Social History: Social History        Family Medical History: Family History Problem Relation Age of Onset  Heart attack Mother         First MI @ 85 - Died @ 58  Heart disease Mother    Heart disease Father  Died @ 90  Throat cancer Brother    Liver cancer Brother    Colon cancer Sister          Physical Examination: Vitals:   08/31/23 1001 BP: 134/80     General:Patient is in no apparent distress. Attention to examination is appropriate.   Neck:               Supple.  Full range of motion.   Respiratory:Patient is breathing without any difficulty.     NEUROLOGICAL:     Awake, alert, oriented to person, place, and time.  Speech is clear and fluent.    Cranial Nerves: Pupils equal round and reactive to light.  Facial tone is symmetric.  Facial  sensation is symmetric. Shoulder shrug is symmetric. Tongue protrusion is midline.     Strength:   Side Iliopsoas Quads Hamstring PF DF EHL R 5 5 5 5 5 5  L 5 5 5 5 5 5    Reflexes are 1+ and symmetric at the biceps, triceps, brachioradialis, patella and achilles.   Hoffman's is absent. Clonus is absent   Bilateral upper and lower extremity sensation decreased throughout the bilateral lower extremities distal to the knee     No evidence of dysmetria noted.   Gait is normal.     Imaging: Narrative & Impression CLINICAL DATA:  Osteoarthritis. Mid back pain. Question thoracic canal stenosis.   EXAM: MRI THORACIC SPINE WITHOUT CONTRAST   TECHNIQUE: Multiplanar, multisequence MR imaging of the thoracic spine was performed. No intravenous contrast was administered.   COMPARISON:  MRI of the lumbar spine and MRI of the cervical spine 02/06/2023. CT chest 09/03/2022.   FINDINGS: Alignment: No significant listhesis is present. Mild straightening of the normal thoracic kyphosis and lumbar lordosis is noted.   Vertebrae: Extensive type 2 Modic changes are present throughout the T12 vertebral body. Diffuse fatty replacement is also present at L1 and L2. Marrow signal and vertebral body heights are otherwise normal.   Cord: Normal signal and morphology. The conus medullaris terminates at L1-2, within normal limits.   Paraspinal and other soft tissues: The paraspinous soft tissues are unremarkable. The visualized lung fields are clear. Simple cysts are present within the left kidney, measuring up to 3.3 cm at the upper pole. No solid lesions are present. No adenopathy is present.   Disc levels:   Minimal disc bulging is present T1-2 T10-11 without significant central canal stenosis stenosis. Asymmetric right-sided facet hypertrophy contributes to mild right foraminal narrowing at T10-11.   Slightly more prominent disc bulging and mild bilateral  facet hypertrophy is present at T11-12 without significant stenosis.   A broad-based disc protrusion is again noted at T12-L1. Moderate left and mild right foraminal stenosis is stable.   Additional disc disease at L1-2 and L2-3 is stable from the previous lumbar spine MRI. Scout images demonstrate multilevel disc disease in cervical spine, described on the recent cervical spine MRI.   IMPRESSION: 1. Stable broad-based disc protrusion at T12-L1 with moderate left and mild right foraminal stenosis. 2. Mild right foraminal narrowing at T10-11 secondary to asymmetric right-sided facet hypertrophy. 3. No other significant stenosis in the thoracic spine. 4. Minimal disc bulging at T1-2 and T10-11 without significant central canal stenosis. 5. Additional disc disease at L1-2 and L2-3 is stable from the previous lumbar spine MRI. 6. Scout images demonstrate multilevel disc disease in cervical spine, described on the recent cervical spine MRI.     Electronically Signed   By: Cristal Deer  Mattern M.D.   On: 06/05/2023 10:29   Narrative & Impression CLINICAL DATA:  78 year old female with persistent low back pain. Prior surgery.   EXAM: MRI LUMBAR SPINE WITHOUT CONTRAST   TECHNIQUE: Multiplanar, multisequence MR imaging of the lumbar spine was performed. No intravenous contrast was administered.   COMPARISON:  Lumbar radiographs 04/26/2016. Previous MRI 10/14/2012.   FINDINGS: Segmentation: Normal on the comparison radiographs, designating previous L3 through S1 fusion.   Alignment: Degenerative retrolisthesis of L2 on L3 has progressed, up to 5 mm now. Superimposed straightening of lumbar lordosis otherwise stable.   Vertebrae: Chronic fusion hardware susceptibility artifact L3 through S1. Substantially progressed thoracolumbar junction and L1-L2 disc and endplate degeneration from prior exams, with confluent marrow edema now at T12-L1 superimposed on chronic degenerative  endplate marrow signal changes there. Background bone marrow signal remains normal. Intact visible sacrum and SI joints. 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   Conus medullaris and cauda equina: Conus extends to the L1 level. No lower spinal cord or conus signal abnormality. Generally normal cauda equina nerve roots.   Paraspinal and other soft tissues: Negative visible abdominal viscera; chronic benign left renal cysts (no follow-up imaging recommended). Postoperative changes to the lower lumbar paraspinal soft tissues with no adverse features.   Disc levels:   T11-T12: Minimal disc degeneration.  No stenosis.   T12-L1: Substantial disc desiccation and disc space loss since 2014. New circumferential disc bulge or protrusion eccentric to the left. Partially effaced ventral CSF space but no significant spinal stenosis at this time (series 9, image 7). Mild to moderate left and mild right T12 foraminal stenosis is new from the previous MRI.   L1-L2: Substantial disc desiccation and disc space loss from priors. Circumferential disc osteophyte complex. Small L1 inferior endplate Schmorl's node. Mild ligament flavum hypertrophy. No significant spinal stenosis. Mild new bilateral L1 neural foraminal stenosis.   L2-L3: Chronic but progressed disc space loss. Progressed retrolisthesis. Progressed circumferential disc osteophyte complex. Mild facet and ligament flavum hypertrophy. Mild to moderate spinal stenosis (series 9, image 8 and series 12, image 16) is similar to the 2014 MRI. Mild to moderate bilateral L2 neural foraminal stenosis, progressed on the right.   L3-L4:  Chronic decompression and fusion.  No stenosis.   L4-L5:  Chronic decompression and fusion.  No stenosis.   L5-S1: Chronic decompression and fusion. Mild osseous left L5 foraminal stenosis is stable. No other adverse features.   IMPRESSION: 1. Chronic L3  through S1 fusion is stable with no adverse features. 2. Progressed adjacent segment disease at L2-L3 where retrolisthesis and bulky disc osteophyte complex combine for up to moderate spinal and bilateral foraminal stenosis. Mild 3. And moderate to severe chronic disc and endplate degeneration is new T12-L1 and L1-L2. Associated degenerative marrow edema at T12-L1. No significant spinal stenosis at either level but new mild to moderate left T12 and bilateral L1 neural foraminal stenosis.     Electronically Signed   By: Odessa Fleming M.D.   On: 02/16/2023 04:06     Narrative & Impression CLINICAL DATA:  Initial evaluation for chronic neck pain, cervical radiculopathy.   EXAM: MRI CERVICAL SPINE WITHOUT CONTRAST   TECHNIQUE: Multiplanar, multisequence MR imaging of the cervical spine was performed. No intravenous contrast was administered.   COMPARISON:  Prior radiograph from 01/19/2018 as well as previous MRI from 07/09/2012.   FINDINGS: Alignment: Mild straightening of the normal cervical lordosis. Trace degenerative anterolisthesis of C3 on C4.  Vertebrae: Vertebral body height maintained without acute or chronic fracture. Bone marrow signal intensity within normal limits. No discrete or worrisome osseous lesions. Minor reactive endplate changes present about the C4-5 through C7-T1 interspaces. Mild reactive marrow edema present about the right C3-4 facet due to facet arthritis (series 7, image 2). No other abnormal marrow edema.   Cord: Normal signal and morphology.   Posterior Fossa, vertebral arteries, paraspinal tissues: Unremarkable.   Disc levels:   C2-C3: Small central disc protrusion mildly indents the ventral thecal sac. Moderate right-sided facet arthrosis. No canal or foraminal stenosis.   C3-C4: Small right paracentral disc protrusion with annular fissure mildly indents the ventral thecal sac (series 9, image 11). Moderate right-sided facet arthrosis. Mild  uncovertebral spurring. No more than mild spinal stenosis. Mild right C4 foraminal narrowing. Left neural foramina remains patent.   C4-C5: Degenerative intervertebral disc space narrowing with circumferential disc osteophyte complex. Flattening of the ventral thecal sac with no more than mild spinal stenosis. Severe right worse than left C5 foraminal narrowing.   C5-C6: General vertebral disc space narrowing. Right paracentral disc protrusion with annular fissure indents the ventral thecal sac (series 8, image 19). Right worse than left facet arthrosis. Mild spinal stenosis. Moderate left C6 foraminal narrowing. Right neural foramina remains patent.   C6-C7: Degenerative or acute disc space narrowing with circumferential disc osteophyte complex. Mild facet hypertrophy. No spinal stenosis. Mild bilateral C7 foraminal narrowing.   C7-T1: Degenerative vertebral disc space narrowing with diffuse disc bulge and bilateral uncovertebral spurring. Superimposed small right foraminal disc protrusion (series 8, image 25). Mild right greater left facet hypertrophy. No spinal stenosis. Mild right C8 foraminal narrowing. Left neural foramen remains patent.   IMPRESSION: 1. Multilevel cervical spondylosis with resultant mild diffuse spinal stenosis at C3-4 through C5-6. 2. Multifactorial degenerative changes with resultant multilevel foraminal narrowing as above. Notable findings include severe right worse than left C5 foraminal stenosis, with moderate left C6 foraminal narrowing. 3. Small right foraminal disc protrusion at C7-T1, potentially affecting the exiting right C8 nerve root. 4. Reactive marrow edema about the right C3-4 facet due to facet arthritis. Finding could serve as a source for neck pain and referred symptoms.     Electronically Signed   By: Rise Mu M.D.   On: 02/12/2023 19:00   I have personally reviewed the images and agree with the above interpretation  with the exceptions or additions below.   Independently reviewed the thoracic MRI to evaluate for spacing given the plan for thoracic spinal cord stimulator placement.  She appears to have adequate spacing required for placement of a paddle lead.   Medical Decision Making/Assessment and Plan: Ms. Klumpp is a pleasant 78 y.o. 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  She has had 100% improvement with her neuropathic pain and lower extremity pain and including improvement in her back pain with a trial stimulator.  She has had her psychotherapy evaluation as well as had adequate imaging.  Her thoracic spine appears to have good room for placement of a paddle.  Will plan to have her undergo a thoracic spinal cord stimulator placement with an IPG implant.  We discussed the risks and benefits of surgery including bleeding, infection, wound failure, failure to resolve her symptoms as well as neurologic injury.  She would like to go forward with surgery.  We ask  for medical and cardiac clearance.  We let her know the risks involved in surgery with continued smoking and she is plans to quit smoking to help with her wound healing.     Thank you for involving me in the care of this patient.      Lovenia Kim MD/MSCR Neurosurgery

## 2023-10-27 NOTE — Anesthesia Postprocedure Evaluation (Signed)
 Anesthesia Post Note  Patient: Sarah Payne  Procedure(s) Performed: THORACIC LAMINECTOMY FOR SPINAL CORD STIMULATOR PLACEMENT WITH INTERNAL PULSE GENERATOR IMPLANT  Patient location during evaluation: PACU Anesthesia Type: General Level of consciousness: awake and alert Pain management: pain level controlled Vital Signs Assessment: post-procedure vital signs reviewed and stable Respiratory status: spontaneous breathing, nonlabored ventilation, respiratory function stable and patient connected to nasal cannula oxygen Cardiovascular status: blood pressure returned to baseline and stable Postop Assessment: no apparent nausea or vomiting Anesthetic complications: no   There were no known notable events for this encounter.   Last Vitals:  Vitals:   10/27/23 1030 10/27/23 1036  BP: (!) 179/78 (!) 179/75  Pulse:    Resp:    Temp:    SpO2: 99%     Last Pain:  Vitals:   10/27/23 1000  PainSc: 5                  Corinda Gubler

## 2023-10-27 NOTE — Interval H&P Note (Signed)
 History and Physical Interval Note:  10/27/2023 7:19 AM  Sarah Payne  has presented today for surgery, with the diagnosis of M96.1 Failed back surgical syndrome   Z98.1 History of lumbar fusion  M54.16, G89.29 Chronic radicular lumbar pain  E11.40  Chronic painful diabetic neuropathy M54.42, M54.41, G89.29 Chronic bilateral low back pain with bilateral sciatica.  The various methods of treatment have been discussed with the patient and family. After consideration of risks, benefits and other options for treatment, the patient has consented to  Procedure(s) with comments: THORACIC LAMINECTOMY FOR SPINAL CORD STIMULATOR PLACEMENT WITH INTERNAL PULSE GENERATOR IMPLANT (N/A) - THORACIC LAMINECTOMY FOR SPINAL CORD STIMULATOR PLACEMENT WITH INTERNAL PULSE GENERATOR IMPLANT as a surgical intervention.  The patient's history has been reviewed, patient examined, no change in status, stable for surgery.  I have reviewed the patient's chart and labs.  Questions were answered to the patient's satisfaction.    Heart and lungs clear  Lovenia Kim

## 2023-10-27 NOTE — H&P (View-Only) (Signed)
 History of Present Illness: 10/27/23 Sarah Payne is here today with a chief complaint of failed back syndrome, chronic lower extremity pain secondary to radiculopathy and diabetic peripheral neuropathy.  She has a history of heart disease as well as smoking.  She had a recent spinal cord stimulator trial placement with Dr. Cherylann Ratel and had 100% recovery of her lower extremity pain.  She has had her psychotherapy evaluation as well as her MRI of her thoracic spine.  She is enthusiastic to move forward with a placement of a spinal cord stimulator.   The symptoms are causing a significant impact on the patient's life.    I have utilized the care everywhere function in epic to review the outside records available from external health systems, I have also reviewed the notes from Dr. Cherylann Ratel who has performed an extensive workup.  He is also performed an extensive treatment regimen which led to a spinal cord stimulator trial which had not 100% recovery of her pain.   Review of Systems:  A 10 point review of systems is negative, except for the pertinent positives and negatives detailed in the HPI.   Past Medical History: Past Medical History: Diagnosis Date  Acute pain of left shoulder 01/17/2019  Acute renal failure superimposed on stage 3b chronic kidney disease (HCC) 09/03/2022  Aortic atherosclerosis (HCC)    Bilateral cataracts 01/2015   a.) s/p extraction  CHF (congestive heart failure) (HCC) 09/16/2013   a.) TTE 09/16/2013: EF 55-60%; mod inferior HK, triv TR; G1DD. b.) TTE 01/02/2016: EF 25-30%; mid-apicalateroseptal, lat, inf, inferoseptal, apical akinesis; triv TR; G1DD. c. TTE 04/16/2016: EF 65%; G1DD. d.) TTE 05/02/2020: EF 50-55%; G2DD. e.) TTE 04/11/2021: EF 55%; G1DD.  Chronic low back pain    Clostridioides difficile infection 2015  COPD (chronic obstructive pulmonary disease) (HCC)    Coronary artery disease     a.) PCI 02/06/2011: 100% mLCx - DES x 3. b.)  PCI 09/17/2011: 95% dRCA - DES x 1. c.) PCI 10/02/2011: 75% pLCX - DES x 1. d.) PCI 08/19/2012: DES x 2 p-mRCA.  e.) LHC 01/01/16: Takotsubo event 01/01/2016 with patent stents  Depression    Diverticulosis    Frequent PVCs     a. Noted in hospital 12/2015.  GERD (gastroesophageal reflux disease)    History of heart artery stent     a.) TOTAL of 7 stents: a.) 02/06/2011 - overlapping 2.5x12 mm Xience V, 2.5x8 mm Xience V, 2.25x8 mm Xience Nano to mLCx. b.) 09/17/2011 - 2.5x23 Xience V dRCA. c.) 10/02/2011 - 2.5x15 mm Xience V pLCx. d.) 08/09/2012 - overlapping 3.0x8mm mRCA and 3.5x64mm pRCA  Hyperlipidemia    Hypertension    Hypothyroidism    Impingement syndrome of left shoulder    LBBB (left bundle branch block)    Left flank pain 10/28/2022  Long term current use of antithrombotics/antiplatelets     a.) clopidogrel  Marijuana abuse    Myocardial infarction [facility])     a.) multiple MIs;  5 per her report  OSA (obstructive sleep apnea)     a.) mild; does not require nocturnal PAP therapy  Pancreatitis 09/19/2022  Paroxysmal SVT (supraventricular tachycardia) (HCC) 05/08/2021   a.)  Zio patch 05/08/2021: 3 distinct SVT runs; fastest 5 beats at a rate of 146 bpm; longest 7 beats at rate of 134 bpm  T2DM (type 2 diabetes mellitus) (HCC)    Takotsubo cardiomyopathy     a. 12/2015 - nephew committed suicide 1 week prior, sister died the  morning of presentation - initially called a STEMI; cath with patent stents. LVEF 25-30%.  Tendonitis of left rotator cuff    Tobacco abuse    Vaginal burning 03/22/2019  Vascular dementia          Past Surgical History: Past Surgical History: Procedure Laterality Date  ABDOMINAL AORTOGRAM W/LOWER EXTREMITY N/A 09/12/2020   Procedure: ABDOMINAL AORTOGRAM W/LOWER EXTREMITY;  Surgeon: Iran Ouch, MD;  Location: [facility] INVASIVE CV LAB;  Service: Cardiovascular;  Laterality: N/A;  ABDOMINAL  HYSTERECTOMY      APPENDECTOMY      BACK SURGERY      CARDIAC CATHETERIZATION N/A 01/01/2016   Procedure: Left Heart Cath and Coronary Angiography;  Surgeon: Corky Crafts, MD;  Location: [facility] INVASIVE CV LAB;  Service: Cardiovascular;  Laterality: N/A;  CATARACT EXTRACTION W/PHACO Left 01/30/2015   Procedure: CATARACT EXTRACTION PHACO AND INTRAOCULAR LENS PLACEMENT (IOC);  Surgeon: Galen Manila, MD;  Location: [facility] ORS;  Service: Ophthalmology;  Laterality: Left;  [place] 00:47    CATARACT EXTRACTION W/PHACO Right 02/13/2015   Procedure: CATARACT EXTRACTION PHACO AND INTRAOCULAR LENS PLACEMENT (IOC);  Surgeon: Galen Manila, MD;  Location: [facility] ORS;  Service: Ophthalmology;  Laterality: Right;  cassette lot # [id number] H [place]  00:29.9 AP  20.7 CDE  6.20  COLONOSCOPY N/A 11/02/2014   Procedure: COLONOSCOPY;  Surgeon: Louis Meckel, MD;  Location: [facility] ENDOSCOPY;  Service: Endoscopy;  Laterality: N/A;  CORONARY ANGIOPLASTY WITH STENT PLACEMENT Left 02/06/2011   Procedure: CORONARY ANGIOPLASTY WITH STENT PLACEMENT; Location: [facility]; Surgeon: Rudean Hitt, MD  CORONARY ANGIOPLASTY WITH STENT PLACEMENT Left 09/17/2011   Procedure: CORONARY ANGIOPLASTY WITH STENT PLACEMENT; Location: [facility]; Surgeon: Rudean Hitt, MD  CORONARY ANGIOPLASTY WITH STENT PLACEMENT Left 10/02/2011   Procedure: CORONARY ANGIOPLASTY WITH STENT PLACEMENT; Location: [facility]; Surgeon: Rudean Hitt, MD  CORONARY ANGIOPLASTY WITH STENT PLACEMENT Left 08/19/2012   Procedure: CORONARY ANGIOPLASTY WITH STENT PLACEMENT; Location: [facility]; Surgeon: Lorine Bears, MD  ESOPHAGOGASTRODUODENOSCOPY (EGD) WITH PROPOFOL N/A 09/21/2018   Procedure: ESOPHAGOGASTRODUODENOSCOPY (EGD) WITH PROPOFOL;  Surgeon: Wyline Mood, MD;  Location: [facility] ENDOSCOPY;  Service: Gastroenterology;  Laterality: N/A;  ESOPHAGOGASTRODUODENOSCOPY (EGD) WITH PROPOFOL N/A 10/06/2022   Procedure:  ESOPHAGOGASTRODUODENOSCOPY (EGD) WITH PROPOFOL;  Surgeon: Wyline Mood, MD;  Location: [facility] ENDOSCOPY;  Service: Gastroenterology;  Laterality: N/A;  EYE SURGERY      LEFT HEART CATH AND CORONARY ANGIOGRAPHY Left 08/02/2014   Procedure: LEFT HEART CATH AND CORONARY ANGIOGRAPHY; Location: [facility]; Surgeon: Bryan Lemma, MD  PERIPHERAL VASCULAR ATHERECTOMY Left 09/12/2020   Procedure: PERIPHERAL VASCULAR ATHERECTOMY;  Surgeon: Iran Ouch, MD;  [facility] INVASIVE CV LAB;  Service: Cardiovascular;  Laterality: Left;  PERIPHERAL VASCULAR BALLOON ANGIOPLASTY   09/12/2020   Procedure: PERIPHERAL VASCULAR BALLOON ANGIOPLASTY;  Surgeon: Iran Ouch, MD;  Location: [facility] INVASIVE CV LAB;  Service: Cardiovascular;;  REVERSE SHOULDER ARTHROPLASTY Left 10/01/2021   Procedure: REVERSE SHOULDER ARTHROPLASTY WITH BICEPS TENODESIS.;  Surgeon: Christena Flake, MD;  Location: [facility] ORS;  Service: Orthopedics;  Laterality: Left;  SHOULDER SURGERY Left 2017        Allergies: Allergies as of 08/31/2023  (No Known Allergies)     Medications: Current Medications      Social History: Social History        Family Medical History: Family History Problem Relation Age of Onset  Heart attack Mother         First MI @ 85 - Died @ 58  Heart disease Mother    Heart disease Father  Died @ 90  Throat cancer Brother    Liver cancer Brother    Colon cancer Sister          Physical Examination: Vitals:   08/31/23 1001 BP: 134/80     General:Patient is in no apparent distress. Attention to examination is appropriate.   Neck:               Supple.  Full range of motion.   Respiratory:Patient is breathing without any difficulty.     NEUROLOGICAL:     Awake, alert, oriented to person, place, and time.  Speech is clear and fluent.    Cranial Nerves: Pupils equal round and reactive to light.  Facial tone is symmetric.  Facial  sensation is symmetric. Shoulder shrug is symmetric. Tongue protrusion is midline.     Strength:   Side Iliopsoas Quads Hamstring PF DF EHL R 5 5 5 5 5 5  L 5 5 5 5 5 5    Reflexes are 1+ and symmetric at the biceps, triceps, brachioradialis, patella and achilles.   Hoffman's is absent. Clonus is absent   Bilateral upper and lower extremity sensation decreased throughout the bilateral lower extremities distal to the knee     No evidence of dysmetria noted.   Gait is normal.     Imaging: Narrative & Impression CLINICAL DATA:  Osteoarthritis. Mid back pain. Question thoracic canal stenosis.   EXAM: MRI THORACIC SPINE WITHOUT CONTRAST   TECHNIQUE: Multiplanar, multisequence MR imaging of the thoracic spine was performed. No intravenous contrast was administered.   COMPARISON:  MRI of the lumbar spine and MRI of the cervical spine 02/06/2023. CT chest 09/03/2022.   FINDINGS: Alignment: No significant listhesis is present. Mild straightening of the normal thoracic kyphosis and lumbar lordosis is noted.   Vertebrae: Extensive type 2 Modic changes are present throughout the T12 vertebral body. Diffuse fatty replacement is also present at L1 and L2. Marrow signal and vertebral body heights are otherwise normal.   Cord: Normal signal and morphology. The conus medullaris terminates at L1-2, within normal limits.   Paraspinal and other soft tissues: The paraspinous soft tissues are unremarkable. The visualized lung fields are clear. Simple cysts are present within the left kidney, measuring up to 3.3 cm at the upper pole. No solid lesions are present. No adenopathy is present.   Disc levels:   Minimal disc bulging is present T1-2 T10-11 without significant central canal stenosis stenosis. Asymmetric right-sided facet hypertrophy contributes to mild right foraminal narrowing at T10-11.   Slightly more prominent disc bulging and mild bilateral  facet hypertrophy is present at T11-12 without significant stenosis.   A broad-based disc protrusion is again noted at T12-L1. Moderate left and mild right foraminal stenosis is stable.   Additional disc disease at L1-2 and L2-3 is stable from the previous lumbar spine MRI. Scout images demonstrate multilevel disc disease in cervical spine, described on the recent cervical spine MRI.   IMPRESSION: 1. Stable broad-based disc protrusion at T12-L1 with moderate left and mild right foraminal stenosis. 2. Mild right foraminal narrowing at T10-11 secondary to asymmetric right-sided facet hypertrophy. 3. No other significant stenosis in the thoracic spine. 4. Minimal disc bulging at T1-2 and T10-11 without significant central canal stenosis. 5. Additional disc disease at L1-2 and L2-3 is stable from the previous lumbar spine MRI. 6. Scout images demonstrate multilevel disc disease in cervical spine, described on the recent cervical spine MRI.     Electronically Signed   By: Cristal Deer  Mattern M.D.   On: 06/05/2023 10:29   Narrative & Impression CLINICAL DATA:  78 year old female with persistent low back pain. Prior surgery.   EXAM: MRI LUMBAR SPINE WITHOUT CONTRAST   TECHNIQUE: Multiplanar, multisequence MR imaging of the lumbar spine was performed. No intravenous contrast was administered.   COMPARISON:  Lumbar radiographs 04/26/2016. Previous MRI 10/14/2012.   FINDINGS: Segmentation: Normal on the comparison radiographs, designating previous L3 through S1 fusion.   Alignment: Degenerative retrolisthesis of L2 on L3 has progressed, up to 5 mm now. Superimposed straightening of lumbar lordosis otherwise stable.   Vertebrae: Chronic fusion hardware susceptibility artifact L3 through S1. Substantially progressed thoracolumbar junction and L1-L2 disc and endplate degeneration from prior exams, with confluent marrow edema now at T12-L1 superimposed on chronic degenerative  endplate marrow signal changes there. Background bone marrow signal remains normal. Intact visible sacrum and SI joints. 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   Conus medullaris and cauda equina: Conus extends to the L1 level. No lower spinal cord or conus signal abnormality. Generally normal cauda equina nerve roots.   Paraspinal and other soft tissues: Negative visible abdominal viscera; chronic benign left renal cysts (no follow-up imaging recommended). Postoperative changes to the lower lumbar paraspinal soft tissues with no adverse features.   Disc levels:   T11-T12: Minimal disc degeneration.  No stenosis.   T12-L1: Substantial disc desiccation and disc space loss since 2014. New circumferential disc bulge or protrusion eccentric to the left. Partially effaced ventral CSF space but no significant spinal stenosis at this time (series 9, image 7). Mild to moderate left and mild right T12 foraminal stenosis is new from the previous MRI.   L1-L2: Substantial disc desiccation and disc space loss from priors. Circumferential disc osteophyte complex. Small L1 inferior endplate Schmorl's node. Mild ligament flavum hypertrophy. No significant spinal stenosis. Mild new bilateral L1 neural foraminal stenosis.   L2-L3: Chronic but progressed disc space loss. Progressed retrolisthesis. Progressed circumferential disc osteophyte complex. Mild facet and ligament flavum hypertrophy. Mild to moderate spinal stenosis (series 9, image 8 and series 12, image 16) is similar to the 2014 MRI. Mild to moderate bilateral L2 neural foraminal stenosis, progressed on the right.   L3-L4:  Chronic decompression and fusion.  No stenosis.   L4-L5:  Chronic decompression and fusion.  No stenosis.   L5-S1: Chronic decompression and fusion. Mild osseous left L5 foraminal stenosis is stable. No other adverse features.   IMPRESSION: 1. Chronic L3  through S1 fusion is stable with no adverse features. 2. Progressed adjacent segment disease at L2-L3 where retrolisthesis and bulky disc osteophyte complex combine for up to moderate spinal and bilateral foraminal stenosis. Mild 3. And moderate to severe chronic disc and endplate degeneration is new T12-L1 and L1-L2. Associated degenerative marrow edema at T12-L1. No significant spinal stenosis at either level but new mild to moderate left T12 and bilateral L1 neural foraminal stenosis.     Electronically Signed   By: Odessa Fleming M.D.   On: 02/16/2023 04:06     Narrative & Impression CLINICAL DATA:  Initial evaluation for chronic neck pain, cervical radiculopathy.   EXAM: MRI CERVICAL SPINE WITHOUT CONTRAST   TECHNIQUE: Multiplanar, multisequence MR imaging of the cervical spine was performed. No intravenous contrast was administered.   COMPARISON:  Prior radiograph from 01/19/2018 as well as previous MRI from 07/09/2012.   FINDINGS: Alignment: Mild straightening of the normal cervical lordosis. Trace degenerative anterolisthesis of C3 on C4.  Vertebrae: Vertebral body height maintained without acute or chronic fracture. Bone marrow signal intensity within normal limits. No discrete or worrisome osseous lesions. Minor reactive endplate changes present about the C4-5 through C7-T1 interspaces. Mild reactive marrow edema present about the right C3-4 facet due to facet arthritis (series 7, image 2). No other abnormal marrow edema.   Cord: Normal signal and morphology.   Posterior Fossa, vertebral arteries, paraspinal tissues: Unremarkable.   Disc levels:   C2-C3: Small central disc protrusion mildly indents the ventral thecal sac. Moderate right-sided facet arthrosis. No canal or foraminal stenosis.   C3-C4: Small right paracentral disc protrusion with annular fissure mildly indents the ventral thecal sac (series 9, image 11). Moderate right-sided facet arthrosis. Mild  uncovertebral spurring. No more than mild spinal stenosis. Mild right C4 foraminal narrowing. Left neural foramina remains patent.   C4-C5: Degenerative intervertebral disc space narrowing with circumferential disc osteophyte complex. Flattening of the ventral thecal sac with no more than mild spinal stenosis. Severe right worse than left C5 foraminal narrowing.   C5-C6: General vertebral disc space narrowing. Right paracentral disc protrusion with annular fissure indents the ventral thecal sac (series 8, image 19). Right worse than left facet arthrosis. Mild spinal stenosis. Moderate left C6 foraminal narrowing. Right neural foramina remains patent.   C6-C7: Degenerative or acute disc space narrowing with circumferential disc osteophyte complex. Mild facet hypertrophy. No spinal stenosis. Mild bilateral C7 foraminal narrowing.   C7-T1: Degenerative vertebral disc space narrowing with diffuse disc bulge and bilateral uncovertebral spurring. Superimposed small right foraminal disc protrusion (series 8, image 25). Mild right greater left facet hypertrophy. No spinal stenosis. Mild right C8 foraminal narrowing. Left neural foramen remains patent.   IMPRESSION: 1. Multilevel cervical spondylosis with resultant mild diffuse spinal stenosis at C3-4 through C5-6. 2. Multifactorial degenerative changes with resultant multilevel foraminal narrowing as above. Notable findings include severe right worse than left C5 foraminal stenosis, with moderate left C6 foraminal narrowing. 3. Small right foraminal disc protrusion at C7-T1, potentially affecting the exiting right C8 nerve root. 4. Reactive marrow edema about the right C3-4 facet due to facet arthritis. Finding could serve as a source for neck pain and referred symptoms.     Electronically Signed   By: Rise Mu M.D.   On: 02/12/2023 19:00   I have personally reviewed the images and agree with the above interpretation  with the exceptions or additions below.   Independently reviewed the thoracic MRI to evaluate for spacing given the plan for thoracic spinal cord stimulator placement.  She appears to have adequate spacing required for placement of a paddle lead.   Medical Decision Making/Assessment and Plan: Sarah Payne is a pleasant 78 y.o. 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  She has had 100% improvement with her neuropathic pain and lower extremity pain and including improvement in her back pain with a trial stimulator.  She has had her psychotherapy evaluation as well as had adequate imaging.  Her thoracic spine appears to have good room for placement of a paddle.  Will plan to have her undergo a thoracic spinal cord stimulator placement with an IPG implant.  We discussed the risks and benefits of surgery including bleeding, infection, wound failure, failure to resolve her symptoms as well as neurologic injury.  She would like to go forward with surgery.  We ask  for medical and cardiac clearance.  We let her know the risks involved in surgery with continued smoking and she is plans to quit smoking to help with her wound healing.     Thank you for involving me in the care of this patient.      Lovenia Kim MD/MSCR Neurosurgery

## 2023-10-27 NOTE — Anesthesia Procedure Notes (Signed)
 Procedure Name: Intubation Date/Time: 10/27/2023 7:36 AM  Performed by: Maryla Morrow., CRNAPre-anesthesia Checklist: Patient identified, Patient being monitored, Timeout performed, Emergency Drugs available and Suction available Patient Re-evaluated:Patient Re-evaluated prior to induction Oxygen Delivery Method: Circle system utilized Preoxygenation: Pre-oxygenation with 100% oxygen Induction Type: IV induction Ventilation: Mask ventilation without difficulty Laryngoscope Size: 3 and McGrath Grade View: Grade I Tube type: Oral Tube size: 6.5 mm Number of attempts: 1 Airway Equipment and Method: Stylet Placement Confirmation: ETT inserted through vocal cords under direct vision, positive ETCO2 and breath sounds checked- equal and bilateral Secured at: 20 cm Tube secured with: Tape Dental Injury: Teeth and Oropharynx as per pre-operative assessment

## 2023-10-27 NOTE — Anesthesia Preprocedure Evaluation (Signed)
 Anesthesia Evaluation  Patient identified by MRN, date of birth, ID band Patient awake    Reviewed: Allergy & Precautions, NPO status , Patient's Chart, lab work & pertinent test results  Airway Mallampati: III  TM Distance: >3 FB Neck ROM: full    Dental  (+) Edentulous Upper, Edentulous Lower   Pulmonary sleep apnea , COPD,  COPD inhaler, Current Smoker and Patient abstained from smoking.   breath sounds clear to auscultation       Cardiovascular Exercise Tolerance: Good hypertension, Pt. on medications + CAD, + Past MI ( 5 per her report, last 2017), + Peripheral Vascular Disease and +CHF (TTE 04/11/2021: EF 55%; G1DD)  Normal cardiovascular exam+ dysrhythmias (frequent PVCs, LBBB)  Rhythm:Regular Rate:Normal - Systolic murmurs history of CAD PCI/DES to the distal RCA in 08/2011, mid LCx in 09/2011, and PCI/DES x2 to the RCA in 07/2012, HFimpEF, PAD status post orbital atherectomy and drug-coated balloon angioplasty to the left SFA in 08/2020, DM2, CKD stage IIIb, intermittent LBBB, HTN, HLD, COPD, hypothyroidism, orthostatic dizziness versus vertigo, and sleep apnea who presents for follow-up of her CAD.    Neuro/Psych  PSYCHIATRIC DISORDERS (vascular dementia)  Depression   Dementia negative neurological ROS     GI/Hepatic ,GERD (pt report rare episodes of coughing with the sensation foreign material in chest. GERD is on her problem list. )  ,,(+)     substance abuse  marijuana use  Endo/Other  diabetes, Poorly Controlled, Type 2, Insulin DependentHypothyroidism    Renal/GU Renal InsufficiencyRenal disease     Musculoskeletal  (+) Arthritis , Osteoarthritis,    Abdominal Normal abdominal exam  (+)   Peds  Hematology negative hematology ROS (+)   Anesthesia Other Findings Past Medical History: No date: Aortic atherosclerosis (HCC) 01/2015: Bilateral cataracts     Comment:  a.) s/p extraction 09/16/2013: CHF  (congestive heart failure) (HCC)     Comment:  a.) TTE 09/16/2013: EF 55-60%; mod inferior HK, triv TR;              G1DD. b.) TTE 01/02/2016: EF 25-30%;               mid-apicalateroseptal, lat, inf, inferoseptal, apical               akinesis; triv TR; G1DD. c. TTE 04/16/2016: EF 65%; G1DD.              d.) TTE 05/02/2020: EF 50-55%; G2DD. e.) TTE 04/11/2021:               EF 55%; G1DD. No date: Chronic low back pain 2015: Clostridioides difficile infection No date: COPD (chronic obstructive pulmonary disease) (HCC) No date: Coronary artery disease     Comment:  a.) PCI 02/06/2011: 100% mLCx - DES x 3. b.) PCI               09/17/2011: 95% dRCA - DES x 1. c.) PCI 10/02/2011: 75%               pLCX - DES x 1. d.) PCI 08/19/2012: DES x 2 p-mRCA.  e.)               LHC 01/01/16: Takotsubo event 01/01/2016 with patent stents No date: Depression No date: Diverticulosis No date: Frequent PVCs     Comment:  a. Noted in hospital 12/2015. No date: GERD (gastroesophageal reflux disease) No date: History of heart artery stent     Comment:  a.) TOTAL of 7 stents: a.)  02/06/2011 - overlapping               2.5x12 mm Xience V, 2.5x8 mm Xience V, 2.25x8 mm Xience               Nano to mLCx. b.) 09/17/2011 - 2.5x23 Xience V dRCA. c.)               10/02/2011 - 2.5x15 mm Xience V pLCx. d.) 08/09/2012 -               overlapping 3.0x57mm mRCA and 3.5x37mm pRCA No date: Hyperlipidemia No date: Hypertension No date: Hypothyroidism No date: Impingement syndrome of left shoulder No date: LBBB (left bundle branch block) No date: Long term current use of antithrombotics/antiplatelets     Comment:  a.) clopidogrel No date: Marijuana abuse No date: Myocardial infarction [facility])     Comment:  a.) multiple MIs;  5 per her report No date: OSA (obstructive sleep apnea)     Comment:  a.) mild; does not require nocturnal PAP therapy 05/08/2021: Paroxysmal SVT (supraventricular tachycardia) (HCC)     Comment:   a.)  Zio patch 05/08/2021: 3 distinct SVT runs; fastest               5 beats at a rate of 146 bpm; longest 7 beats at rate of               134 bpm No date: T2DM (type 2 diabetes mellitus) (HCC) No date: Takotsubo cardiomyopathy     Comment:  a. 12/2015 - nephew committed suicide 1 week prior,               sister died the morning of presentation - initially               called a STEMI; cath with patent stents. LVEF 25-30%. No date: Tendonitis of left rotator cuff No date: Tobacco abuse No date: Vascular dementia  Past Surgical History: 09/12/2020: ABDOMINAL AORTOGRAM W/LOWER EXTREMITY; N/A     Comment:  Procedure: ABDOMINAL AORTOGRAM W/LOWER EXTREMITY;                Surgeon: Iran Ouch, MD;  Location: [facility] CV              LAB;  Service: Cardiovascular;  Laterality: N/A; No date: ABDOMINAL HYSTERECTOMY No date: APPENDECTOMY No date: BACK SURGERY 01/01/2016: CARDIAC CATHETERIZATION; N/A     Comment:  Procedure: Left Heart Cath and Coronary Angiography;                Surgeon: Corky Crafts, MD;  Location: [facility]               CV LAB;  Service: Cardiovascular;  Laterality: N/A; 01/30/2015: CATARACT EXTRACTION W/PHACO; Left     Comment:  Procedure: CATARACT EXTRACTION PHACO AND INTRAOCULAR               LENS PLACEMENT (IOC);  Surgeon: Galen Manila, MD;                Location: [facility] ORS;  Service: Ophthalmology;  Laterality:              Left;  [place] 00:47  02/13/2015: CATARACT EXTRACTION W/PHACO; Right     Comment:  Procedure: CATARACT EXTRACTION PHACO AND INTRAOCULAR               LENS PLACEMENT (IOC);  Surgeon: Galen Manila, MD;  Location: [facility] ORS;  Service: Ophthalmology;  Laterality:              Right;  cassette lot # [id number] H [place]  00:29.9 AP  20.7 CDE               6.20 11/02/2014: COLONOSCOPY; N/A     Comment:  Procedure: COLONOSCOPY;  Surgeon: Louis Meckel, MD;                Location: [facility] ENDOSCOPY;  Service: Endoscopy;   Laterality:              N/A; 02/06/2011: CORONARY ANGIOPLASTY WITH STENT PLACEMENT; Left     Comment:  Procedure: CORONARY ANGIOPLASTY WITH STENT PLACEMENT;               Location: [facility]; Surgeon: Rudean Hitt, MD 09/17/2011: CORONARY ANGIOPLASTY WITH STENT PLACEMENT; Left     Comment:  Procedure: CORONARY ANGIOPLASTY WITH STENT PLACEMENT;               Location: [facility]; Surgeon: Rudean Hitt, MD 10/02/2011: CORONARY ANGIOPLASTY WITH STENT PLACEMENT; Left     Comment:  Procedure: CORONARY ANGIOPLASTY WITH STENT PLACEMENT;               Location: [facility]; Surgeon: Rudean Hitt, MD 08/19/2012: CORONARY ANGIOPLASTY WITH STENT PLACEMENT; Left     Comment:  Procedure: CORONARY ANGIOPLASTY WITH STENT PLACEMENT;               Location: [facility]; Surgeon: Lorine Bears, MD 09/21/2018: ESOPHAGOGASTRODUODENOSCOPY (EGD) WITH PROPOFOL; N/A     Comment:  Procedure: ESOPHAGOGASTRODUODENOSCOPY (EGD) WITH               PROPOFOL;  Surgeon: Wyline Mood, MD;  Location: [facility]               ENDOSCOPY;  Service: Gastroenterology;  Laterality: N/A; 08/02/2014: LEFT HEART CATH AND CORONARY ANGIOGRAPHY; Left     Comment:  Procedure: LEFT HEART CATH AND CORONARY ANGIOGRAPHY;               Location: [facility]; Surgeon: Bryan Lemma, MD 09/12/2020: PERIPHERAL VASCULAR ATHERECTOMY; Left     Comment:  Procedure: PERIPHERAL VASCULAR ATHERECTOMY;  Surgeon:               Iran Ouch, MD;  [facility];                Service: Cardiovascular;  Laterality: Left; 09/12/2020: PERIPHERAL VASCULAR BALLOON ANGIOPLASTY     Comment:  Procedure: PERIPHERAL VASCULAR BALLOON ANGIOPLASTY;                Surgeon: Iran Ouch, MD;  [facility]              LAB;  Service: Cardiovascular;; 2017: SHOULDER SURGERY; Left     Reproductive/Obstetrics negative OB ROS                             Anesthesia Physical Anesthesia Plan  ASA: 3  Anesthesia Plan: General    Post-op Pain Management:    Induction: Intravenous  PONV Risk Score and Plan: 3 and Propofol infusion, TIVA, Ondansetron, Dexamethasone and Treatment may vary due to age or medical condition  Airway Management Planned: Natural Airway, Nasal Cannula, Oral ETT and Video Laryngoscope Planned  Additional Equipment: None  Intra-op Plan:   Post-operative Plan: Extubation in OR  Informed Consent: I have reviewed  the patients History and Physical, chart, labs and discussed the procedure including the risks, benefits and alternatives for the proposed anesthesia with the patient or authorized representative who has indicated his/her understanding and acceptance.     Dental Advisory Given  Plan Discussed with: Anesthesiologist, CRNA and Surgeon  Anesthesia Plan Comments: (Discussed risks of anesthesia with patient, including PONV, sore throat, lip/dental/eye damage. Rare risks discussed as well, such as cardiorespiratory and neurological sequelae, and allergic reactions. Discussed the role of CRNA in patient's perioperative care. Patient understands. Patient counseled on benefits of smoking cessation, and increased perioperative risks associated with continued smoking. )        Anesthesia Quick Evaluation

## 2023-10-29 ENCOUNTER — Emergency Department
Admission: EM | Admit: 2023-10-29 | Discharge: 2023-10-29 | Disposition: A | Discharge status: Home / Self Care | Attending: Emergency Medicine | Admitting: Emergency Medicine

## 2023-10-29 ENCOUNTER — Encounter: Payer: Self-pay | Admitting: Neurosurgery

## 2023-10-29 ENCOUNTER — Other Ambulatory Visit: Payer: Self-pay

## 2023-10-29 DIAGNOSIS — R630 Anorexia: Secondary | ICD-10-CM | POA: Diagnosis present

## 2023-10-29 DIAGNOSIS — G8918 Other acute postprocedural pain: Secondary | ICD-10-CM | POA: Diagnosis not present

## 2023-10-29 DIAGNOSIS — E860 Dehydration: Secondary | ICD-10-CM | POA: Diagnosis not present

## 2023-10-29 DIAGNOSIS — M549 Dorsalgia, unspecified: Secondary | ICD-10-CM | POA: Diagnosis not present

## 2023-10-29 DIAGNOSIS — E119 Type 2 diabetes mellitus without complications: Secondary | ICD-10-CM | POA: Insufficient documentation

## 2023-10-29 DIAGNOSIS — I110 Hypertensive heart disease with heart failure: Secondary | ICD-10-CM | POA: Diagnosis not present

## 2023-10-29 DIAGNOSIS — J449 Chronic obstructive pulmonary disease, unspecified: Secondary | ICD-10-CM | POA: Diagnosis not present

## 2023-10-29 DIAGNOSIS — R000 Tachycardia, unspecified: Secondary | ICD-10-CM | POA: Diagnosis not present

## 2023-10-29 DIAGNOSIS — R0902 Hypoxemia: Secondary | ICD-10-CM | POA: Diagnosis not present

## 2023-10-29 DIAGNOSIS — R112 Nausea with vomiting, unspecified: Secondary | ICD-10-CM | POA: Diagnosis not present

## 2023-10-29 DIAGNOSIS — R739 Hyperglycemia, unspecified: Secondary | ICD-10-CM | POA: Diagnosis not present

## 2023-10-29 DIAGNOSIS — I509 Heart failure, unspecified: Secondary | ICD-10-CM | POA: Diagnosis not present

## 2023-10-29 LAB — CBC WITH DIFFERENTIAL/PLATELET
Abs Immature Granulocytes: 0.06 10*3/uL (ref 0.00–0.07)
Basophils Absolute: 0 10*3/uL (ref 0.0–0.1)
Basophils Relative: 0 %
Eosinophils Absolute: 0 10*3/uL (ref 0.0–0.5)
Eosinophils Relative: 0 %
HCT: 39.7 % (ref 36.0–46.0)
Hemoglobin: 13.7 g/dL (ref 12.0–15.0)
Immature Granulocytes: 0 %
Lymphocytes Relative: 9 %
Lymphs Abs: 1.3 10*3/uL (ref 0.7–4.0)
MCH: 33.3 pg (ref 26.0–34.0)
MCHC: 34.5 g/dL (ref 30.0–36.0)
MCV: 96.4 fL (ref 80.0–100.0)
Monocytes Absolute: 1.3 10*3/uL — ABNORMAL HIGH (ref 0.1–1.0)
Monocytes Relative: 9 %
Neutro Abs: 11.5 10*3/uL — ABNORMAL HIGH (ref 1.7–7.7)
Neutrophils Relative %: 82 %
Platelets: 311 10*3/uL (ref 150–400)
RBC: 4.12 MIL/uL (ref 3.87–5.11)
RDW: 12.4 % (ref 11.5–15.5)
WBC: 14.2 10*3/uL — ABNORMAL HIGH (ref 4.0–10.5)
nRBC: 0 % (ref 0.0–0.2)

## 2023-10-29 LAB — BASIC METABOLIC PANEL WITH GFR
Anion gap: 14 (ref 5–15)
BUN: 39 mg/dL — ABNORMAL HIGH (ref 8–23)
CO2: 22 mmol/L (ref 22–32)
Calcium: 9.3 mg/dL (ref 8.9–10.3)
Chloride: 92 mmol/L — ABNORMAL LOW (ref 98–111)
Creatinine, Ser: 1.34 mg/dL — ABNORMAL HIGH (ref 0.44–1.00)
GFR, Estimated: 41 mL/min — ABNORMAL LOW (ref >60)
Glucose, Bld: 275 mg/dL — ABNORMAL HIGH (ref 70–99)
Potassium: 4 mmol/L (ref 3.5–5.1)
Sodium: 128 mmol/L — ABNORMAL LOW (ref 135–145)

## 2023-10-29 LAB — LACTIC ACID, PLASMA: Lactic Acid, Venous: 1.5 mmol/L (ref 0.5–1.9)

## 2023-10-29 MED ORDER — SODIUM CHLORIDE 0.9 % IV BOLUS
1000.0000 mL | Freq: Once | INTRAVENOUS | Status: AC
  Administered 2023-10-29: 1000 mL via INTRAVENOUS

## 2023-10-29 MED ORDER — HYDROMORPHONE HCL 1 MG/ML IJ SOLN
1.0000 mg | Freq: Once | INTRAMUSCULAR | Status: AC
  Administered 2023-10-29: 1 mg via INTRAVENOUS
  Filled 2023-10-29: qty 1

## 2023-10-29 MED ORDER — HYDROMORPHONE HCL 2 MG PO TABS: 2.0000 mg | ORAL_TABLET | ORAL | 0 refills | Status: AC | PRN

## 2023-10-29 NOTE — ED Provider Notes (Addendum)
 [facility] Provider Note    Event Date/Time   First MD Initiated Contact with Patient 10/29/23 1930     (approximate)   History   Chief Complaint: Back Pain   HPI  Sarah Payne is a 78 y.o. 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  Feels like the spinal stimulator is not working adequately.  She has been taking gabapentin and oxycodone at home.          Physical Exam   Triage Vital Signs: ED Triage Vitals  Encounter Vitals Group     BP 10/29/23 1935 (!) 145/106     Systolic BP Percentile --      Diastolic BP Percentile --      Pulse Rate 10/29/23 1935 (!) 114     Resp 10/29/23 1935 20     Temp 10/29/23 1935 99.1 F (37.3 C)     Temp Source 10/29/23 1935 Oral     SpO2 10/29/23 1935 93 %     Weight 10/29/23 1936 125 lb (56.7 kg)     Height 10/29/23 1936 5\' 6"  (1.676 m)     Head Circumference --      Peak Flow --      Pain Score 10/29/23 1935 10     Pain Loc --      Pain Education --      Exclude from Growth Chart --     Most recent vital signs: Vitals:   10/29/23 2130 10/29/23 2200  BP: (!) 145/63 133/78  Pulse: (!) 103 (!) 103  Resp:  18  Temp:    SpO2: (!) 88% 91%    General: Awake, no distress.  CV:  Good peripheral perfusion.  Tachycardia heart rate 110, normal distal pulses Resp:  Normal effort.  Clear to auscultation bilaterally Abd:  No distention.  Soft nontender Other:  2 surgical incisions over the lower thoracic spine and right lower back are intact, noninflamed.  There is some tenderness over the lower thoracic spine in the surgical area, no swelling or inflammatory  soft tissue changes.  No drainage.  No lower extremity swelling.  Moving all extremities   ED Results / Procedures / Treatments   Labs (all labs ordered are listed, but only abnormal results are displayed) Labs Reviewed  BASIC METABOLIC PANEL WITH GFR - Abnormal; Notable for the following components:      Result Value   Sodium 128 (*)    Chloride 92 (*)    Glucose, Bld 275 (*)    BUN 39 (*)    Creatinine, Ser 1.34 (*)    GFR, Estimated 41 (*)    All other components within normal limits  CBC WITH DIFFERENTIAL/PLATELET - Abnormal; Notable for the following components:   WBC 14.2 (*)    Neutro Abs 11.5 (*)    Monocytes Absolute 1.3 (*)    All other components within normal limits  CULTURE, BLOOD (ROUTINE X 2)  CULTURE, BLOOD (ROUTINE X 2)  LACTIC ACID, PLASMA  URINALYSIS, W/ REFLEX TO CULTURE (INFECTION SUSPECTED)     EKG    RADIOLOGY    PROCEDURES:  Procedures  MEDICATIONS ORDERED IN ED: Medications  sodium chloride 0.9 % bolus 1,000 mL (0 mLs Intravenous Stopped 10/29/23 2136)  HYDROmorphone (DILAUDID) injection 1 mg (1 mg Intravenous Given 10/29/23 2124)     IMPRESSION / MDM / ASSESSMENT AND PLAN / ED COURSE  I reviewed the triage vital signs and the nursing notes.  DDx: Dehydration, AKI, UTI, viral illness, doubt postop infection  Patient's presentation is most consistent with acute presentation with potential threat to life or bodily function.  Patient presents with low back pain after back surgery 2 days ago.  She is tachycardic and appears to be dehydrated.  Doubt sepsis.  Doubt ACS PE pneumonia.  Will check labs, give IV fluids for hydration, IV analgesics.  Will discuss with neurosurgery team when available (currently in OR with emergency case) for further recommendations.   ----------------------------------------- 11:29 PM on 10/29/2023 ----------------------------------------- Care discussed with neurosurgery, no specific recommendations at this  time.  Patient is feeling much better after Dilaudid.  Tolerating oral intake.  Feels comfortable with discharge home.  She will discontinue her oxycodone, I will prescribe a limited supply of Dilaudid tablets since this is working a lot better for pain relief and not causing severe nausea as a side effect which resulted in p.o. intolerance and dehydration.  Return precautions discussed      FINAL CLINICAL IMPRESSION(S) / ED DIAGNOSES   Final diagnoses:  Post-operative pain  Dehydration     Rx / DC Orders   ED Discharge Orders          Ordered    HYDROmorphone (DILAUDID) 2 MG tablet  Every 4 hours PRN        10/29/23 2328             Note:  This document was prepared using Dragon voice recognition software and may include unintentional dictation errors.   Sharman Cheek, MD 10/29/23 2330    Sharman Cheek, MD 10/29/23 2330

## 2023-10-29 NOTE — ED Triage Notes (Signed)
  Patient BIB EMS for lower back pain after spinal stimulator surgery on Tuesday.  Patient states she has been unable to eat since surgery and feels like it is not working.  Has been using gabapentin and oxy at home for pain control.  Pain 10/10, sharp.

## 2023-10-29 NOTE — ED Notes (Signed)
 Family member at beside ask to speak to MD stafford with regard to what the next steps will be,  MD made area of same and will be stopping by to speak with family member.

## 2023-10-30 ENCOUNTER — Telehealth: Payer: Self-pay | Admitting: Neurosurgery

## 2023-10-30 NOTE — Telephone Encounter (Signed)
  Media Information   Document Information  thoracic laminectomy for spinal cord stimulator placement [facility]) on 10/27/23/  Patient went to the ER yesterday.

## 2023-11-03 LAB — CULTURE, BLOOD (ROUTINE X 2)
Culture: NO GROWTH
Culture: NO GROWTH
Special Requests: ADEQUATE

## 2023-11-04 ENCOUNTER — Encounter: Payer: Medicare PPO | Admitting: Neurosurgery

## 2023-11-04 ENCOUNTER — Other Ambulatory Visit (INDEPENDENT_AMBULATORY_CARE_PROVIDER_SITE_OTHER): Admitting: Pharmacist

## 2023-11-04 DIAGNOSIS — E1151 Type 2 diabetes mellitus with diabetic peripheral angiopathy without gangrene: Secondary | ICD-10-CM

## 2023-11-04 NOTE — Progress Notes (Signed)
 11/04/2023 Name: Sarah Payne MRN: [id number] DOB: 1946-01-15  Chief Complaint  Patient presents with   Diabetes   Sarah Payne is a 78 y.o. year old female who presented for a pharmacotherapy telephone visit though presented to clinic mistakenly, accompanied by her daughter-in-law. Seen in office. They were referred to the pharmacist by their PCP for assistance in managing diabetes.   Diabetes Complications: peripheral neuropathy, DKD  PMH also significant for CAD, CHF/takotsubo, HTN, hx postural dizziness/near-syncope, OSA, HLD, COPD, OSA, hypothyroidism, CKD, hyperlipidemia, diabetic neuropathy, tobacco dependence.  Last Diabetes-Related Visit: 09/03/23 with PCP, 10/06/23 with Pharmacist Summary of Recent Change:  3/18: Due to hypoglycemia, use Novolog  w lunch meal only unless having carb for dinner. Emphasized importance of using Novolog  before/up to time of lunch meal to prevent spikes rather than treat them 2/13: FGB 140 >240 s/p coffee; pre-lunch 110>240 s/p lunch followed by 40s-50s; HS 110. Reports unintentional wt loss -25lb. Start Januvia  100 mg  Care Team: Primary Care Provider: Gabriel John, NP  Cardiologist: Belva Boyden, MD; Last visit 09/11/23  Medication Access/Adherence Patient reports affordability concerns with their medications: No  Patient reports access/transportation concerns to their pharmacy: No  Patient reports adherence concerns with their medications:  No    Diabetes: Subjective: Since last visit, reports doing well aside from her spinal surgery last week. Has been in significant pain leading to ED visit this week. Pain somewhat improved with hydromorphone  though cannot tolerate due to significant drowsiness and has been using only at night to sleep. Continues taking tylenol .   Feels Januvia  is working well to stabilize her sugars. Notes she received a letter in the mail from her insurance stating they prefer her to switch to something  cheaper. Has not had issues filling this at the pharmacy yet.   Reports sugars following surgery had been a bit higher initially though at present are close to her usual range. Patient continues to use Novolog  twice daily (lunch/dinner) despite only eating ~1/4 of her dinner meal consisting of vegetables/meat). Denies lows though does not have her Jerrilyn Moras reader with her today.   Current medications:  Januvia  100 mg daily Novolog  5 units AClunch - Has been using 5 unit BID (lunch, dinner)  Medications previously tried: glipizide , metformin  IR (diarrhea), Trulicity (cost)  SMBG: CGM (Libre 2) - With reader Does not have Libre reader today. Unable to review BG.   Hypoglycemia: Denies instances of hypoglycemia since procedure despite reduced appetite  Diet:  Breakfast: skips. Coffee with 3 Splenda and half/half  Lunch (12-12:30 pm): Skips or has a sandwich (estimates eats lunch ~3 days per week) sandwich (tomatoes), sometimes chips, bologna sandwich Dinner (6:30 pm): meat and 2 vegetables (low appetite, often will make dinner and find she is full after several bites, no carb side - Never finishes a full meal) Snacks: May have small handful of chips Beverages: Sweet tea (makes her own w regular sugar)   DM Prevention:  Statin: Taking; high intensity.?  History of chronic kidney disease? yes History of albuminuria? yes, last UACR on 09/03/23 = 259 mg/g (increased from 2024 <30) ACE/ARB - Not taking; Urine MA/CR Ratio - elevated urinary albumin excretion.  Last eye exam: 2022; No retinopathy present - DUE (patient confirms no eye exam since 2022) Last foot exam: 06/03/2023 Tobacco Use: Current every day smoker (declines pharmacotherapy - discussed w cardiology)  Immunizations:? Flu: Up to date (Last: 06/03/2023); Pneumococcal: Up to Date; Shingrix: Up to date (Last: 01/07/2022, 02/15/2021)  Cardiovascular  Risk Reduction History of clinical ASCVD? yes The ASCVD Risk score (Arnett DK, et al.,  2019) failed to calculate for the following reasons:   Risk score cannot be calculated because patient has a medical history suggesting prior/existing ASCVD History of heart failure? yes (cardiology follows) History of hyperlipidemia? yes (cardiology follows) Current BMI: 20.3 kg/m2 (Ht 5' 6.5", Wt 58.1 kg) Taking statin? yes; high intensity (atorvastatin  80 mg) Taking aspirin ? indicated (secondary prevention);  Taking Plavix  75 mg daily (cardiology follows)    Taking SGLT-2i? no Taking GLP- 1 RA? no   Heart Failure: - cardiology Current medications:  ACEi/ARB/ARNI: none, hypotension/orthostasis at baseline SGLT2i: None, c/f hypotension/orthostasis Beta blocker: carvedilol  3.125 mg twice daily. Titration limited by bradycardia Mineralocorticoid Receptor Antagonist: none Diuretic regimen: furosemide  20 mg MWF + K 10mEq MWF     _______________________________________________  Objective    Review of Systems:? Limited in the setting of virtual visit  Constitutional:? No fever, chills. Cardiovascular:? No chest pain or pressure, shortness of breath, dyspnea on exertion, orthopnea or LE edema  Pulmonary:? No shortness of breath  GI:? Difficulty swallowing solid food. No nausea, vomiting, constipation, diarrhea, abdominal pain, dyspepsia, change in bowel habits  Endocrine:? No polyuria, polyphagia or blurred vision    Physical Examination:  Vitals:  Wt Readings from Last 3 Encounters:  10/29/23 125 lb (56.7 kg)  10/27/23 125 lb (56.7 kg)  10/22/23 125 lb (56.7 kg)   BP Readings from Last 3 Encounters:  10/29/23 (!) 142/79  10/27/23 (!) 179/75  10/22/23 120/72   Pulse Readings from Last 3 Encounters:  10/29/23 98  10/27/23 (!) 52  09/11/23 71    Labs:?  Lab Results  Component Value Date   HGBA1C 8.5 (A) 09/03/2023   HGBA1C 8.0 (A) 06/03/2023   HGBA1C 8.1 (A) 03/03/2023   GLUCOSE 275 (H) 10/29/2023   MICRALBCREAT 259.0 (H) 09/03/2023   MICRALBCREAT 12.2 07/31/2022    MICRALBCREAT 1.0 05/30/2013   CREATININE 1.34 (H) 10/29/2023   CREATININE 1.45 (H) 10/19/2023   CREATININE 1.20 03/03/2023   GFR 44.44 (L) 03/03/2023   GFR 42.42 (L) 10/28/2022   GFR 26.69 (L) 09/19/2022    Lab Results  Component Value Date   CHOL 221 (H) 09/11/2023   LDLCALC 134 (H) 09/11/2023   LDLCALC 93 03/03/2023   LDLCALC 32 08/28/2022   LDLDIRECT 52 08/28/2022   LDLDIRECT 143.0 12/24/2018   HDL 67 09/11/2023   TRIG 112 09/11/2023   TRIG 158.0 (H) 03/03/2023   TRIG 213 (H) 08/28/2022   ALT 9 10/28/2022   ALT 235 (H) 09/24/2022   AST 14 10/28/2022   AST 94 (H) 09/24/2022      Chemistry      Component Value Date/Time   NA 128 (L) 10/29/2023 2015   NA 137 09/24/2022 1421   NA 136 08/02/2014 0408   K 4.0 10/29/2023 2015   K 3.1 (L) 08/02/2014 0408   CL 92 (L) 10/29/2023 2015   CL 100 08/02/2014 0408   CO2 22 10/29/2023 2015   CO2 28 08/02/2014 0408   BUN 39 (H) 10/29/2023 2015   BUN 22 09/24/2022 1421   BUN 15 08/02/2014 0408   CREATININE 1.34 (H) 10/29/2023 2015   CREATININE 1.06 (H) 04/04/2016 1721      Component Value Date/Time   CALCIUM  9.3 10/29/2023 2015   CALCIUM  8.5 08/02/2014 0408   ALKPHOS 86 10/28/2022 1127   ALKPHOS 73 06/12/2014 1703   AST 14 10/28/2022 1127   AST 21 06/12/2014 1703  ALT 9 10/28/2022 1127   ALT 16 06/12/2014 1703   BILITOT 0.4 10/28/2022 1127   BILITOT <0.2 09/24/2022 1421   BILITOT 0.3 06/12/2014 1703        Assessment and Plan:   # Diabetes, type 2: uncontrolled per last A1c of 8.5% (09/03/23), increased from 8.0%. Reasonable goal < 7.5% (age, comorbidities). Has started taking evening Novolog  again (previously stopped d/t frequent overnight hypoglycemia). No reader today, unable to evaluate BG.  Notes concerning letter in the mail stating Januvia  may no longer be covered.  - Current Regimen: Januvia  100 mg daily, Novolog  5 units AC (using twice daily) - Diet: Very low intake (does not eat full meals, reports only bites  - 1-2 times per day. Low/no carb dinners) - Discussed adding protein shakes between meals. Historically has not liked. - - Recommended trying Fairlife (30g protein, no added sugar, 2g total sugar, consistency of milk/more palatable) - Exercise: None  - HCM: Due for eye exam. Encouraged patient to schedule No BG readings today. Continue current regimen.  Reviewed s/sx/tx hypoglycemia (1/2 can soda, 3-4 glucose tabs) PA: CMM Key [id number] (Januvia  100 mg)   # Kidney Disease (G3:A2): untreated/uncontrolled. CKD staging warrants Nephrology referral if UACR remains elevated on repeat as ordered by PCP. Not currently on RAAS or SGLT2i due to complex history of positional dizziness with pre-syncope. Needs to be renal protective therapies given evidence of decline in renal health per drastic increase in proteinuria + chronic reduced eGFR though would likely require adjustment in other CHF medications as to avoid hypotension (managed by cards). Current Regimen: N/A Continue medications without changes.  Future Consideration: Nephrology Referral RAASi: Strong recommendation to slow decline of renal function and to reduce albuminuria.  SGLT2i: Consider risk/benefit if UACR remains elevated despite max tolerated RAAS. SGLT2i beneficial regardless given incontrolled DM though c/f orthostatic intolerance noted previously. Patient does not drink a lot of water , skips meals frequently. C/f risk euglycemic DKA on SGKT2i.    # HTN/CHF: BP controlled based on last clinic BP of 120/72 mmHg, acutely elevated in ED 2/2 pain. Goal <130/80 mmHg. Denies lightheadedness, dizziness, SOB, CP, vision changes. Normotensive historically on current CHF regimen. Future Consideration: RAASi: Strong recommendation SGLT2i: Patient does not drink a lot of water , skips meals frequently. C/f risk euglycemic DKA on SGKT2i. Consider risk/benefit if UACR remains elevated despite max tolerated RAAS. SGLT2i beneficial regardless given  uncontrolled DM though c/f orthostatic intolerance noted previously.    Follow Up PCP visit scheduled 1 month Phar f/u 1 month after PCP visit and PRN  Future Appointments  Date Time Provider Department Center  11/12/2023 10:00 AM Noble Bateman, PA [facility] None  11/13/2023 11:00 AM Sheria Dills, RN [facility] None  12/01/2023 12:00 PM Gabriel John, NP [facility]  12/07/2023 10:45 AM Carroll Clamp, MD [facility] None  10/25/2024  1:00 PM [facility] ANNUAL WELLNESS VISIT 2 [facility]   Daron Ellen, PharmD Clinical Pharmacist [facility] [phone]

## 2023-11-12 ENCOUNTER — Ambulatory Visit (INDEPENDENT_AMBULATORY_CARE_PROVIDER_SITE_OTHER): Admitting: Neurosurgery

## 2023-11-12 ENCOUNTER — Encounter: Payer: Self-pay | Admitting: Neurosurgery

## 2023-11-12 VITALS — BP 124/66 | Ht 66.0 in | Wt 125.0 lb

## 2023-11-12 DIAGNOSIS — E1140 Type 2 diabetes mellitus with diabetic neuropathy, unspecified: Secondary | ICD-10-CM

## 2023-11-12 DIAGNOSIS — Z9689 Presence of other specified functional implants: Secondary | ICD-10-CM

## 2023-11-12 DIAGNOSIS — Z09 Encounter for follow-up examination after completed treatment for conditions other than malignant neoplasm: Secondary | ICD-10-CM

## 2023-11-12 DIAGNOSIS — M961 Postlaminectomy syndrome, not elsewhere classified: Secondary | ICD-10-CM

## 2023-11-12 MED ORDER — METHOCARBAMOL 500 MG PO TABS: 500.0000 mg | ORAL_TABLET | Freq: Three times a day (TID) | ORAL | 0 refills | Status: DC | PRN

## 2023-11-12 MED ORDER — TRAMADOL HCL 50 MG PO TABS: 50.0000 mg | ORAL_TABLET | Freq: Four times a day (QID) | ORAL | 0 refills | Status: DC | PRN

## 2023-11-12 NOTE — Progress Notes (Signed)
   REFERRING PHYSICIAN:  Gabriel John, [address]p [address] [place],  [place] [address]  DOS: 10/27/23 SCS placement [facility])  HISTORY OF PRESENT ILLNESS: Sarah Payne is approximately 2 weeks status post SCS placement. She has had significant difficulty with incisional pain and back pain resulting in an ER visit on 4/10. She was discharged home with oxycodone  however could not tolerate this due to confusion.  She was changed to Dilaudid  in the ER which she has been taking only at night as it causes significant dizziness issues. Today she reports significant pain in her mid back and dysesthetic pain in her skin as well as pain around her right flank near her battery site.  She has not had any fevers, chills, or drainage from her incision. She does report resolution of her lower extremity pain.   PHYSICAL EXAMINATION:  General: Patient is well developed, well nourished, calm, collected, and in no apparent distress.   NEUROLOGICAL:  General: In no acute distress.   Awake, alert, oriented to person, place, and time.  Pupils equal round and reactive to light.  Facial tone is symmetric.    Strength:            Side Iliopsoas Quads Hamstring PF DF EHL  R 5 5 5 5 5 5   L 5 5 5 5 5 5    Incisions c/d/i   ROS (Neurologic):  Negative except as noted above  IMAGING: No interval imaging to review  ASSESSMENT/PLAN:  Sarah Payne is doing fair approximately 2 weeks after SCS.  Due to medication side effects she has only been taking Dilaudid  at night but is having significant pain during the day.  She continues to take her home gabapentin  and Tylenol  which are chronic for her and have not provided significant improvement.  She recalls taking tramadol  after her shoulder surgery and recalls tolerating this well.  We will start her on a low-dose of tramadol  to take as needed throughout the day.  We discussed holding the Dilaudid  for now.  She can continue to take half of a Dilaudid  at night as needed  for severe pain.  I have also sent in Robaxin  to take as needed for muscle pain.  We will hold off on any NSAID medications due to her kidney history.  She will contact our office tomorrow to update us  on how she is doing.  We discussed medication side effects in detail and she knows to stop medicines should they cause adverse side effects.  she will follow up with Dr. Felipe Horton in 4 weeks or sooner with any questions or concerns.  Advised to contact the office if any questions or concerns arise.  Anastacio Karvonen PA-C Department of neurosurgery

## 2023-11-16 ENCOUNTER — Encounter: Payer: Self-pay | Admitting: Neurosurgery

## 2023-12-01 ENCOUNTER — Ambulatory Visit: Payer: Medicare PPO | Admitting: Primary Care

## 2023-12-03 ENCOUNTER — Other Ambulatory Visit: Payer: Self-pay

## 2023-12-03 NOTE — Patient Outreach (Unsigned)
 Complex Care Management   Visit Note  12/04/2023  Name:  Sarah Payne MRN: [id number] DOB: May 17, 1946  Situation: Referral received for Complex Care Management related to diabetes/ back pain I obtained verbal consent from Patient.  Visit completed with patient  on the phone  Background:   Past Medical History:  Diagnosis Date   Acute pain of left shoulder 01/17/2019   Aortic atherosclerosis (HCC)    CHF (congestive heart failure) (HCC) 09/16/2013   a.) TTE 09/16/2013: EF 55-60%; mod inferior HK, triv TR; G1DD. b.) TTE 01/02/2016: EF 25-30%; mid-apicalateroseptal, lat, inf, inferoseptal, apical akinesis; triv TR; G1DD. c. TTE 04/16/2016: EF 65%; G1DD. d.) TTE 05/02/2020: EF 50-55%; G2DD. e.) TTE 04/11/2021: EF 55%; G1DD.   Chronic low back pain with bilateral sciatica    Chronic painful diabetic neuropathy (HCC)    Chronic radicular lumbar pain    CKD (chronic kidney disease), stage III (HCC)    Clostridioides difficile infection 2015   COPD (chronic obstructive pulmonary disease) (HCC)    Coronary artery disease    a.) PCI 02/06/2011: 100% mLCx - DES x 3. b.) PCI 09/17/2011: 95% dRCA - DES x 1. c.) PCI 10/02/2011: 75% pLCX - DES x 1. d.) PCI 08/19/2012: DES x 2 p-mRCA.  e.) LHC 01/01/16: Takotsubo event 01/01/2016 with patent stents   Depression    Diverticulosis    Failed back syndrome    Frequent PVCs    a. Noted in hospital 12/2015.   GERD (gastroesophageal reflux disease)    History of bilateral cataract extraction 01/2015   History of heart artery stent    a.) TOTAL of 7 stents --> 02/06/2011 - overlapping 2.5 x 12 mm Xience V, 2.5 x 8 mm Xience V, 2.25 x 8 mm Xience Nano to mLCx; 09/17/2011 - 2.5 x 23 Xience V dRCA; 10/02/2011 - 2.5 x 15 mm Xience V pLCx; 08/09/2012 - overlapping 3.0 x 38mm mRCA and 3.5 x 23mm pRCA   Hyperlipidemia    Hypertension    Hypothyroidism    Impingement syndrome of left shoulder    LBBB (left bundle branch block)    Long term current use of  clopidogrel     Marijuana abuse    Myocardial infarction [facility])    a.) multiple MIs;  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 12/2015 - nephew committed suicide 1 week prior, sister died the morning of presentation - initially called a STEMI; cath with patent stents. LVEF 25-30%.   Tendonitis of left rotator cuff    Tobacco abuse    Vaginal burning 03/22/2019   Vascular dementia     Assessment: Patient Reported Symptoms:  Cognitive Cognitive Status: Alert and oriented to person, place, and time, Insightful and able to interpret abstract concepts, Normal speech and language skills Cognitive/Intellectual Conditions Management [RPT]: None reported or documented in medical history or problem list   Health Maintenance Behaviors: Annual physical exam Healing Pattern: Average Health Facilitated by: Pain control, Rest  Neurological Neurological Review of Symptoms: Weakness, Other: [place]her Neurological Symptoms/Conditions [RPT]: Per chart review - failed back surgery syndrome stats post spinal stimulator implantation on 10/27/23.  Back pain patient describes as 10 on pain scale ongoing / persistent Neurological Management Strategies: Medication therapy, Routine screening, Medical device Neurological Self-Management Outcome: 3 (uncertain) Neurological Comment: Patient states pain has totally debilitated her. She states she was independent / driving prior to procedure on 10/27/23 of spinal stimulator implantation as well as taking care of her husband. She states she is now unable  to do things for herself due to pain. She states she is unable to lean back and apply pressure to surgical area, walk far, cook for herself, etc.  HEENT HEENT Symptoms Reported: No symptoms reported      Cardiovascular Cardiovascular Symptoms Reported: Dizziness Does patient have uncontrolled Hypertension?: No Cardiovascular Conditions: Hypertension, Heart failure (orthostatic hypertension) Cardiovascular Management Strategies: Routine screening, Medication therapy Weight: 112 lb (50.8 kg) Cardiovascular Comment: Patient states she gets dizzy when she stands up and starts walking.  Reports having a history of this.  Respiratory Respiratory Symptoms Reported: Shortness of breath Additional Respiratory Details: patient states has mild shortness of breath with exertion Respiratory Conditions: Shortness of breath, COPD (history of bronchitis)  Endocrine Patient reports the following symptoms related to hypoglycemia or hyperglycemia : No symptoms reported Is patient diabetic?: Yes Is patient checking blood sugars at home?: Yes Endocrine Conditions: Diabetes Endocrine Management Strategies: Routine screening, Medication therapy Endocrine Comment: Patient reports her blood sugar ranges from 68- 245.  She states her blood sugar drops 1 time per day to the 70's or to 68. She reports treating the blood sugar by drinking or eating something sweet.  Patient states she contributes the drop in blood sugar to a decrease in her appetite. Patient reports only eating a few bites of her food 1-2 times per day.   Per chart review most recent Hgb A1 c 8.5 % on 09/03/23. Patient states she's not had an eye exam in a few years.  Gastrointestinal Gastrointestinal Symptoms Reported: Change in appetite Additional Gastrointestinal Details: patient states appetite has decreased since having back sugery 10/27/23 Gastrointestinal Management Strategies: Medication therapy Gastrointestinal Comment: Per chart review patient is  taking mitarzipine.  Patient states she has lost approximately 12 lbs since having back surgery 10/27/23. She states she's had a lack of appetite due to the ongoing pain she has been in since her back surgery. Patient states she was scheduled to see her primary care provider on 12/01/23 but had to cancel because she was in to much pain to go. Nutrition Risk Screen (CP): Reduced oral intake over the last month, Unintentional loss of 10 lbs or more in the past 2 months  Genitourinary Genitourinary Symptoms Reported: No symptoms reported    Integumentary Integumentary Symptoms Reported: No symptoms reported    Musculoskeletal Musculoskelatal Symptoms Reviewed: Difficulty walking, Unsteady gait, Weakness Musculoskeletal Conditions: Back pain, Unsteady gait Musculoskeletal Comment: patient states ongoing back pain status post surgery.  States patient is all the time and unrelieved by prescribed medications and / or treatment Falls in the past year?: Yes Number of falls in past year: 2 or more Was there an injury with Fall?: No Fall Risk Category Calculator: 2 Patient Fall Risk Level: Moderate Fall Risk    Psychosocial Psychosocial Symptoms Reported: Depression - if selected complete PHQ 2-9 Behavioral Health Conditions: Depression Major Change/Loss/Stressor/Fears (CP): Medical condition, self Techniques to Cope with Loss/Stress/Change: Diversional activities Quality of Family Relationships: supportive  Do you feel physically threatened by others?: No      12/03/2023   10:51 AM  Depression screen PHQ 2/9  Decreased Interest 2  Down, Depressed, Hopeless 2  PHQ - 2 Score 4  Altered sleeping 1  Tired, decreased energy 3  Change in appetite 3  Feeling bad or failure about yourself  0  Trouble concentrating 0  Moving slowly or fidgety/restless 0  Suicidal thoughts 0  PHQ-9 Score 11  Difficult doing work/chores Very difficult    There were no vitals filed for this visit.  Medications  Reviewed Today     Reviewed by Mee Macdonnell E, RN (Registered Nurse) on 12/03/23 at 1058  Med List Status: <None>   Medication Order Taking? Sig Documenting Provider Last Dose Status Informant  acetaminophen  (TYLENOL ) 650 MG CR tablet [id number] Yes Take 1,300 mg by mouth 2 (two) times daily. [provider] Taking Active Self  atorvastatin  (LIPITOR ) 80 MG tablet [id number] Yes Take 80 mg by mouth daily. [provider] Taking Active Self  carvedilol  (COREG ) 3.125 MG tablet [id number] Yes Take 1 tablet (3.125 mg total) by mouth 2 (two) times daily. Roark Chick, PA-C Taking Active Self  clobetasol  cream (TEMOVATE ) 0.05 % [id number] Yes Apply 1 Application topically 2 (two) times daily as needed. For vaginal itching. Gabriel John, NP Taking Active Self  clopidogrel  (PLAVIX ) 75 MG tablet [id number] Yes Take 1 tablet (75 mg total) by mouth daily. Roark Chick, PA-C Taking Active Self           Med Note Alvera Jock, DENA L   Wed Aug 19, 2023  8:51 AM)    Continuous Glucose Receiver (FREESTYLE LIBRE 2 READER) DEVI [id number]  Use with sensor to check blood sugars 6 times daily. Gabriel John, NP  Active Self  Continuous Glucose Sensor (FREESTYLE LIBRE 2 SENSOR) MISC [id number]  USE ONE SENSOR EVERY 14 DAYS TO CHECK BLOOD SUGAR SIX TIMES PER DAY Gabriel John, NP  Active Self  docusate sodium  (COLACE) 100 MG capsule [id number] Yes Take 1 capsule (100 mg total) by mouth 2 (two) times daily. Ludwig Safer, PA-C Taking Active   DULoxetine  (CYMBALTA ) 60 MG capsule [id number] Yes Take 60 mg by mouth daily. [provider] Taking Active Self  Evolocumab  (REPATHA  SURECLICK) 140 MG/ML SOAJ [id number] Yes Inject 140 mg into the skin every 14 (fourteen) days. Gollan, Timothy J, MD Taking Active Self  ezetimibe  (ZETIA ) 10 MG tablet [id number] Yes Take 1 tablet (10 mg total) by mouth daily. For cholesterol. Visser, Jacquelyn D, PA-C Taking Active Self  famotidine  (PEPCID ) 20 MG  tablet [id number] Yes Take 1 tablet (20 mg total) by mouth at bedtime. For nausea and acid Clark, Katherine K, NP Taking Active Self  furosemide  (LASIX ) 20 MG tablet [id number] Yes Take 1 tablet (20 mg total) by mouth as needed (As needed for weight gain greater than 3 pounds overnight or increased shortness of breath.). Take one tab only on Monday, Wednesday, & Fridays  Patient taking differently: Take 20 mg by mouth every Monday, Wednesday, and Friday.   Roark Chick, PA-C Taking Active Self  gabapentin  (NEURONTIN ) 300 MG capsule [id number] Yes TAKE 1 CAPSULE BY MOUTH TWICE DAILY FOR PAIN Clark, Katherine K, NP Taking Active Self           Med Note Marrie Sizer, Lucretia Pendley E   Thu Dec 03, 2023 10:36 AM) Patient states she is taking 3 per day  Glucagon  (GVOKE HYPOPEN  2-PACK) 1 MG/0.2ML SOAJ  [id number] Yes Inject 1 mg (1 pen) as needed for severe low blood sugar (sustained glucose less than 55 despite oral glucose treatments). May repeat in 15 minutes as needed. Gabriel John, NP Taking Active Self  HYDROmorphone  (DILAUDID ) 2 MG tablet [id number] No Take by mouth every 4 (four) hours as needed for severe pain (pain score 7-10).  Patient not taking: Reported on 12/03/2023   [provider] Not Taking Active   insulin  aspart (NOVOLOG  FLEXPEN) 100 UNIT/ML FlexPen [id number] Yes INJECT 6-16 UNITS  THREE TIMES DAILY WITH MEALS PER  SLIDING  SCALE  FOR  DIABETES  Patient taking differently: Inject 5 Units into the skin 2 (two) times daily with a meal.   Gabriel John, NP Taking Active Self           Med Note Kolleen Perone   Mon Oct 12, 2023  4:51 PM)    Insulin  Pen Needle (INSUPEN PEN NEEDLES) 32G X 4 MM MISC [id number]  Use to inject insulin  4 times daily. Gabriel John, NP  Active Self  isosorbide  mononitrate (IMDUR ) 30 MG 24 hr tablet [id number] Yes Take 30 mg by mouth daily. [provider] Taking Active Self  levothyroxine  (SYNTHROID ) 100 MCG tablet [id number] Yes TAKE 1 TABLET BY  MOUTH IN THE MORNING ON  AN  EMPTY  STOMACH  WITH  WATER   ONLY.  NO  FOOD  OR  OTHER  MEDICATIONS  FOR  30  MINUTES Clark, Katherine K, NP Taking Active Self  methocarbamol  (ROBAXIN ) 500 MG tablet [id number] Yes Take 1 tablet (500 mg total) by mouth every 8 (eight) hours as needed for muscle spasms. Noble Bateman, PA Taking Active   mirtazapine  (REMERON ) 15 MG tablet [id number] Yes Take 1 tablet (15 mg total) by mouth at bedtime. For sleep and depression  Patient taking differently: Take 15 mg by mouth at bedtime as needed (sleep).   Gabriel John, NP Taking Active Self  omeprazole  (PRILOSEC) 40 MG capsule [id number] Yes Take 1 capsule (40 mg total) by mouth daily. Luke Salaam, MD Taking Active Self  ondansetron  (ZOFRAN ) 4 MG tablet [id number] Yes Take 1 tablet (4 mg total) by mouth every 6 (six) hours as needed for nausea. Tower, Manley Seeds, MD Taking Active Self  potassium chloride  (KLOR-CON  M) 10 MEQ tablet [id number] Yes Only take on days you take your lasix . Take 1 tablet (10 mEq total) by mouth for 1 dose on those days you take your lasix  20mg  pill.  Patient taking differently: Take 10 mEq by mouth every Monday, Wednesday, and Friday.   Roark Chick, PA-C Taking Active Self  senna (SENOKOT) 8.6 MG TABS tablet [id number] Yes Take 1 tablet (8.6 mg total) by mouth daily. Ludwig Safer, PA-C Taking Active   sitaGLIPtin  (JANUVIA ) 100 MG tablet [id number] Yes Take 1 tablet (100 mg total) by mouth daily. for diabetes. Gabriel John, NP Taking Active Self           Med Note Talbot Factor, LINDSAY R   Tue Oct 06, 2023  8:22 AM)    traMADol  (ULTRAM ) 50 MG tablet [id number] No Take 1 tablet (50 mg total) by mouth every 6 (six) hours as needed.  Patient not taking: Reported on 12/03/2023   Noble Bateman, PA Not Taking Active   traZODone  (DESYREL ) 50 MG tablet [id number] Yes Take 50 mg by mouth at bedtime. [provider] Taking Active Self  Med List Note Redmond Candle 02/17/23 1405):  02-17-23 Message  sent to Dr. Gollan in Epic for permission to stop Plavix  7 days. DW             Recommendation:   PCP Follow-up  Follow Up Plan:   Telephone follow-up in 1 month with RN care manager  Verba Girt RN, BSN, CCM [place]  [facility], Population Health Case Manager Phone: [phone]

## 2023-12-04 NOTE — Patient Instructions (Signed)
 Visit Information  Thank you for taking time to visit with me today. Please don't hesitate to contact me if I can be of assistance to you before our next scheduled appointment.  Your next care management appointment is by telephone on 12/16/23 at 9:30 am  Telephone follow-up in 1 month with RN care manager  Please call the care guide team at [phone] if you need to cancel, schedule, or reschedule an appointment.   Please call the Suicide and Crisis Lifeline: 988 call 1-800-273-TALK (toll free, 24 hour hotline) if you are experiencing a Mental Health or Behavioral Health Crisis or need someone to talk to.  Verba Girt RN, BSN, CCM [employer], Population Health Case Manager Phone: [phone]

## 2023-12-07 ENCOUNTER — Ambulatory Visit: Admitting: Neurosurgery

## 2023-12-07 ENCOUNTER — Encounter: Payer: Self-pay | Admitting: Neurosurgery

## 2023-12-07 VITALS — BP 138/88 | Ht 66.0 in | Wt 112.0 lb

## 2023-12-07 DIAGNOSIS — Z9689 Presence of other specified functional implants: Secondary | ICD-10-CM

## 2023-12-07 DIAGNOSIS — Z09 Encounter for follow-up examination after completed treatment for conditions other than malignant neoplasm: Secondary | ICD-10-CM

## 2023-12-07 DIAGNOSIS — M961 Postlaminectomy syndrome, not elsewhere classified: Secondary | ICD-10-CM

## 2023-12-07 NOTE — Progress Notes (Signed)
   REFERRING PHYSICIAN:  Gabriel John, [address] [place],  [place] [address]  DOS: 10/27/23 SCS placement [facility]  HISTORY OF PRESENT ILLNESS: Sarah Payne is approximately 6 weeks status post SCS placement.  She was unable to be programmed at her last visit given some postoperative pain and swelling, because of this she has not yet had any considerable amount of therapy from her stimulator.  She is frustrated currently as expected as she has had the placement but has not yet been able to work with the stimulation.  PHYSICAL EXAMINATION:  General: Patient is well developed, well nourished, calm, collected, and in no apparent distress.   NEUROLOGICAL:  General: In no acute distress.   Awake, alert, oriented to person, place, and time.  Pupils equal round and reactive to light.  Facial tone is symmetric.    Strength:            Side Iliopsoas Quads Hamstring PF DF EHL  R 5 5 5 5 5 5   L 5 5 5 5 5 5    Incisions c/d/i   ROS (Neurologic):  Negative except as noted above  IMAGING: No interval imaging to review  ASSESSMENT/PLAN:  Sarah Payne is doing fair approximately 6 weeks after SCS.  She has had the placement, but unfortunately because of some postoperative skin tenderness she was unable to be programmed at her last visit, she is here today for programming.  Overall she is quite displeased as she has not been able to utilize the stimulation at all yet, I did discuss with her that the procedure itself was not therapeutic without the use of the stimulator and that we will not know whether or not her stimulator has been successful until she is able to receive the therapy that its intended to deliver.  We discussed this in detail.  She is planning on working with the device rep's to get a ideal coverage, I did explain to her that it will likely take multiple weeks to get this into a correct position and setting for her and will often take several titrations to get to its  optimal point.  I would like to see her back in approximately 4 to 6 weeks to follow-up on her pain control.  Advised to contact the office if any questions or concerns arise.  Carroll Clamp, MD Department of neurosurgery

## 2023-12-16 ENCOUNTER — Telehealth: Payer: Self-pay

## 2023-12-23 ENCOUNTER — Ambulatory Visit: Payer: Self-pay | Admitting: Primary Care

## 2023-12-23 ENCOUNTER — Encounter: Payer: Self-pay | Admitting: Primary Care

## 2023-12-23 ENCOUNTER — Ambulatory Visit (INDEPENDENT_AMBULATORY_CARE_PROVIDER_SITE_OTHER): Admitting: Primary Care

## 2023-12-23 VITALS — BP 146/84 | HR 72 | Temp 97.2°F | Ht 66.0 in | Wt 127.0 lb

## 2023-12-23 DIAGNOSIS — E782 Mixed hyperlipidemia: Secondary | ICD-10-CM | POA: Diagnosis not present

## 2023-12-23 DIAGNOSIS — Z9861 Coronary angioplasty status: Secondary | ICD-10-CM | POA: Diagnosis not present

## 2023-12-23 DIAGNOSIS — I5032 Chronic diastolic (congestive) heart failure: Secondary | ICD-10-CM | POA: Diagnosis not present

## 2023-12-23 DIAGNOSIS — I2510 Atherosclerotic heart disease of native coronary artery without angina pectoris: Secondary | ICD-10-CM | POA: Diagnosis not present

## 2023-12-23 DIAGNOSIS — M5442 Lumbago with sciatica, left side: Secondary | ICD-10-CM

## 2023-12-23 DIAGNOSIS — Z7984 Long term (current) use of oral hypoglycemic drugs: Secondary | ICD-10-CM

## 2023-12-23 DIAGNOSIS — E785 Hyperlipidemia, unspecified: Secondary | ICD-10-CM

## 2023-12-23 DIAGNOSIS — M5441 Lumbago with sciatica, right side: Secondary | ICD-10-CM | POA: Diagnosis not present

## 2023-12-23 DIAGNOSIS — E039 Hypothyroidism, unspecified: Secondary | ICD-10-CM

## 2023-12-23 DIAGNOSIS — E1151 Type 2 diabetes mellitus with diabetic peripheral angiopathy without gangrene: Secondary | ICD-10-CM

## 2023-12-23 DIAGNOSIS — G8929 Other chronic pain: Secondary | ICD-10-CM

## 2023-12-23 LAB — HEMOGLOBIN A1C: Hgb A1c MFr Bld: 7.7 % — ABNORMAL HIGH (ref 4.6–6.5)

## 2023-12-23 LAB — TSH: TSH: 10.94 u[IU]/mL — ABNORMAL HIGH (ref 0.35–5.50)

## 2023-12-23 MED ORDER — CLOPIDOGREL BISULFATE 75 MG PO TABS: 75.0000 mg | ORAL_TABLET | Freq: Every day | ORAL | 0 refills | Status: DC

## 2023-12-23 MED ORDER — ATORVASTATIN CALCIUM 80 MG PO TABS: 80.0000 mg | ORAL_TABLET | Freq: Every day | ORAL | 0 refills | Status: DC

## 2023-12-23 MED ORDER — EZETIMIBE 10 MG PO TABS: 10.0000 mg | ORAL_TABLET | Freq: Every day | ORAL | 0 refills | Status: DC

## 2023-12-23 NOTE — Assessment & Plan Note (Signed)
 Repeat A1c pending.  Continue Januvia  100 mg daily, NovoLog  sliding scale with meals.

## 2023-12-23 NOTE — Assessment & Plan Note (Addendum)
 Resume clopidogrel  75 mg daily, atorvastatin  80 mg daily, Zetia  10 mg daily.  Refills provided.

## 2023-12-23 NOTE — Assessment & Plan Note (Signed)
 Repeat TSH pending. Continue levothyroxine  100 mcg daily.

## 2023-12-23 NOTE — Patient Instructions (Addendum)
 Stop by the lab prior to leaving today. I will notify you of your results once received.   Ask your heart doctor for a refill of your isosorbide  mononitrite (Imdur ) heart pill.   I refilled your cholesterol medications atorvastatin  and Zetia . Do not take the Repatha  injection at this time.  Please schedule a physical to meet with me in 3 months. Make this a 40 minute slot.  It was a pleasure to see you today!

## 2023-12-23 NOTE — Assessment & Plan Note (Signed)
 Improving slightly with spinal cord stimulator. Follow-up as scheduled.

## 2023-12-23 NOTE — Assessment & Plan Note (Signed)
 Confirmed that patient was not taking atorvastatin  or Zetia  for lipid panel drawn February 2025.  Resume atorvastatin  80 mg daily, Zetia  10 mg daily. Refrain from Repatha  use for now, she has not started anyway.  Repeat lipid panel during next visit.  If LDL remains above goal then start Repatha .

## 2023-12-23 NOTE — Progress Notes (Signed)
 Subjective:    Patient ID: Sarah Payne, female    DOB: 1945/10/08, 78 y.o.   MRN: [id number]  Back Pain Associated symptoms include numbness. Pertinent negatives include no chest pain.    Sarah Payne is a very pleasant 78 y.o. female with significant medical history including CAD, CHF, hypertension, type 2 diabetes, COPD, OSA, lumbar spondylosis with myelopathy, chronic bilateral lower back pain with bilateral sciatica who presents today to discuss back pain and medication regimen.  She is also due for repeat TSH and A1c.  1) Chronic Back Pain: Following with neurosurgery through [facility] health.  Last evaluated on 12/07/2023 for follow-up of spinal cord stimulator placement from 10/27/2023.  During this visit she was frustrated as she had yet to be programmed for stimulation treatment given postoperative pain and swelling.  Will plan was for her to work with the device rep on programming and settings.  Recommendations were for follow-up 4 to 6 weeks later.  Today she continues to experience bilateral lower back pain which has improved some. Her back pain to the right side has improved with the stimulator vibration but she needs vibration to the left side. She's had one adjustment thus far, but has an appointment scheduled next week for another adjustment. She regrets having the stimulator cord placed.  2) Medication Regimen: She's confused about her prescribed medication regimen.    Follows with cardiology and is prescribed atorvastatin  80 mg daily, carvedilol  3.25 mg twice daily, clopidogrel  75 mg daily, Zetia  10 mg daily, furosemide  20 mg 3 days weekly, isosorbide  mononitrate 30 mg daily, potassium chloride , Repatha  every 2 weeks.  She has not been taking atorvastatin  80 mg, clopidogrel  75 mg, Zetia  10 mg, Repatha  every 2 weeks, isosorbide  mononitrate 30 mg.  Lipid panel in February 2025 with LDL of 134 which was a significant crease from norm.  She was contacted by cardiology and endorsed that she  was taking atorvastatin  and Zetia  so Repatha  was prescribed.  Today she discusses that she was not taking atorvastatin  and Zetia  at the time her labs were drawn.  She is needing refills today of atorvastatin , Zetia , clopidogrel , Imdur .  She denies chest pain.  She is also not been taking famotidine , omeprazole , duloxetine .  BP Readings from Last 3 Encounters:  12/23/23 (!) 146/84  12/07/23 138/88  11/12/23 124/66     Review of Systems  Respiratory:  Negative for shortness of breath.   Cardiovascular:  Negative for chest pain.  Musculoskeletal:  Positive for arthralgias and back pain.  Neurological:  Positive for numbness.         Past Medical History:  Diagnosis Date   Acute pain of left shoulder 01/17/2019   Aortic atherosclerosis (HCC)    CHF (congestive heart failure) (HCC) 09/16/2013   a.) TTE 09/16/2013: EF 55-60%; mod inferior HK, triv TR; G1DD. b.) TTE 01/02/2016: EF 25-30%; mid-apicalateroseptal, lat, inf, inferoseptal, apical akinesis; triv TR; G1DD. c. TTE 04/16/2016: EF 65%; G1DD. d.) TTE 05/02/2020: EF 50-55%; G2DD. e.) TTE 04/11/2021: EF 55%; G1DD.   Chronic low back pain with bilateral sciatica    Chronic painful diabetic neuropathy (HCC)    Chronic radicular lumbar pain    CKD (chronic kidney disease), stage III (HCC)    Clostridioides difficile infection 2015   COPD (chronic obstructive pulmonary disease) (HCC)    Coronary artery disease    a.) PCI 02/06/2011: 100% mLCx - DES x 3. b.) PCI 09/17/2011: 95% dRCA - DES x 1. c.) PCI 10/02/2011: 75% pLCX -  DES x 1. d.) PCI 08/19/2012: DES x 2 p-mRCA.  e.) LHC 01/01/16: Takotsubo event 01/01/2016 with patent stents   Depression    Diverticulosis    Failed back syndrome    Frequent PVCs    a. Noted in hospital 12/2015.   GERD (gastroesophageal reflux disease)    History of bilateral cataract extraction 01/2015   History of heart artery stent    a.) TOTAL of 7 stents --> 02/06/2011 - overlapping 2.5 x 12 mm Xience V,  2.5 x 8 mm Xience V, 2.25 x 8 mm Xience Nano to mLCx; 09/17/2011 - 2.5 x 23 Xience V dRCA; 10/02/2011 - 2.5 x 15 mm Xience V pLCx; 08/09/2012 - overlapping 3.0 x 38mm mRCA and 3.5 x 23mm pRCA   Hyperlipidemia    Hypertension    Hypothyroidism    Impingement syndrome of left shoulder    LBBB (left bundle branch block)    Long term current use of clopidogrel     Marijuana abuse    Myocardial infarction [facility])    a.) multiple MIs;  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 12/2015 - nephew committed suicide 1 week prior, sister died the morning of presentation - initially called a STEMI; cath with patent stents. LVEF 25-30%.   Tendonitis of left rotator cuff    Tobacco abuse    Vaginal burning 03/22/2019   Vascular dementia     Social History   Socioeconomic History   Marital status: Married    Spouse name: Sarah Payne   Number of children: Not on file   Years of education: Not on file   Highest education level: Not on file  Occupational History   Not on file  Tobacco Use   Smoking status: Every Day    Current packs/day: 1.00    Average packs/day: 1 pack/day for 45.0 years (45.0 ttl pk-yrs)    Types: Cigarettes   Smokeless tobacco: Never   Tobacco comments:    Has cut back, trying to quit.   Vaping Use   Vaping status: Some Days   Substances: Nicotine   Substance and Sexual Activity   Alcohol use: No    Alcohol/week: 0.0 standard drinks of alcohol   Drug use: Yes    Types: Marijuana    Comment: last noc   Sexual activity: Not on file  Other Topics Concern    Not on file  Social History Narrative   Lives at home with her husband in [place].  Previously used marijuana - quit.      Regular exercise: no/ pain from a frozen rotator cuff   Caffeine use: coffee daily and pepsi      Does not have a living will.   Daughters and husband know her wishes- would desire CPR but not prolonged life support if futile   Social Drivers of Corporate investment banker Strain: Low Risk  (10/22/2023)   Overall Financial Resource Strain (CARDIA)    Difficulty of Paying  Living Expenses: Not hard at all  Food Insecurity: No Food Insecurity (12/03/2023)   Hunger Vital Sign    Worried About Running Out of Food in the Last Year: Never true    Ran Out of Food in the Last Year: Never true  Transportation Needs: No Transportation Needs (12/03/2023)   PRAPARE - Administrator, Civil Service (Medical): No    Lack of Transportation (Non-Medical): No  Physical Activity: Insufficiently Active (10/22/2023)   Exercise Vital Sign    Days of Exercise per Week: 3 days    Minutes of Exercise per Session: 20 min  Stress: No Stress Concern Present (10/22/2023)   [employer] of Occupational Health - Occupational Stress Questionnaire    Feeling of Stress : Only a little  Social Connections: Moderately Isolated (10/22/2023)   Social Connection and Isolation Panel [NHANES]    Frequency of Communication with Friends and Family: Twice a week    Frequency of Social Gatherings with Friends and Family: Once a week    Attends Religious Services: Never    Database administrator or Organizations: No    Attends Banker Meetings: Never    Marital Status: Married  Catering manager Violence: Not At Risk (12/03/2023)   Humiliation, Afraid, Rape, and Kick questionnaire    Fear of Current or Ex-Partner: No    Emotionally Abused: No    Physically Abused: No    Sexually Abused: No    Past Surgical History:  Procedure Laterality Date   ABDOMINAL AORTOGRAM W/LOWER  EXTREMITY N/A 09/12/2020   Procedure: ABDOMINAL AORTOGRAM W/LOWER EXTREMITY;  Surgeon: Wenona Hamilton, MD;  Location: [facility] INVASIVE CV LAB;  Service: Cardiovascular;  Laterality: N/A;   ABDOMINAL HYSTERECTOMY     APPENDECTOMY     BACK SURGERY     CARDIAC CATHETERIZATION N/A 01/01/2016   Procedure: Left Heart Cath and Coronary Angiography;  Surgeon: Lucendia Rusk, MD;  Location: [facility] INVASIVE CV LAB;  Service: Cardiovascular;  Laterality: N/A;   CATARACT EXTRACTION W/PHACO Left 01/30/2015   Procedure: CATARACT EXTRACTION PHACO AND INTRAOCULAR LENS PLACEMENT (IOC);  Surgeon: Clair Crews, MD;  Location: [facility] ORS;  Service: Ophthalmology;  Laterality: Left;  US  00:47    CATARACT EXTRACTION W/PHACO Right 02/13/2015   Procedure: CATARACT EXTRACTION PHACO AND INTRAOCULAR LENS PLACEMENT (IOC);  Surgeon: Clair Crews, MD;  Location: [facility] ORS;  Service: Ophthalmology;  Laterality: Right;  cassette lot # [id number] H US   00:29.9 AP  20.7 CDE  6.20   COLONOSCOPY N/A 11/02/2014   Procedure: COLONOSCOPY;  Surgeon: Claudette Cue, MD;  Location: [facility] ENDOSCOPY;  Service: Endoscopy;  Laterality: N/A;   CORONARY ANGIOPLASTY WITH STENT PLACEMENT Left 02/06/2011   Procedure: CORONARY ANGIOPLASTY WITH STENT PLACEMENT; Location: [facility]; Surgeon: Thais Fill, MD   CORONARY ANGIOPLASTY WITH STENT PLACEMENT Left 09/17/2011   Procedure: CORONARY ANGIOPLASTY WITH STENT PLACEMENT; Location: [facility]; Surgeon: Thais Fill, MD   CORONARY ANGIOPLASTY WITH STENT PLACEMENT Left 10/02/2011   Procedure: CORONARY ANGIOPLASTY WITH STENT PLACEMENT; Location: [facility]; Surgeon: Thais Fill, MD   CORONARY ANGIOPLASTY WITH STENT PLACEMENT Left 08/19/2012   Procedure: CORONARY ANGIOPLASTY WITH STENT PLACEMENT; Location: [facility]; Surgeon: Antionette Kirks, MD   ESOPHAGOGASTRODUODENOSCOPY (EGD) WITH PROPOFOL  N/A 09/21/2018   Procedure: ESOPHAGOGASTRODUODENOSCOPY (EGD) WITH PROPOFOL ;  Surgeon: Luke Salaam, MD;  Location: [facility]  ENDOSCOPY;  Service: Gastroenterology;  Laterality: N/A;   ESOPHAGOGASTRODUODENOSCOPY (EGD) WITH PROPOFOL  N/A 10/06/2022   Procedure: ESOPHAGOGASTRODUODENOSCOPY (EGD) WITH PROPOFOL ;  Surgeon: Luke Salaam, MD;  Location: [facility] ENDOSCOPY;  Service: Gastroenterology;  Laterality: N/A;   EYE SURGERY     LEFT HEART CATH AND CORONARY ANGIOGRAPHY Left 08/02/2014   Procedure: LEFT HEART CATH AND CORONARY ANGIOGRAPHY; Location: [facility]; Surgeon: Randene Bustard, MD   PERIPHERAL VASCULAR ATHERECTOMY Left 09/12/2020   Procedure: PERIPHERAL VASCULAR ATHERECTOMY;  Surgeon: Wenona Hamilton, MD;  Location: [facility] INVASIVE CV LAB;  Service: Cardiovascular;  Laterality: Left;   PERIPHERAL VASCULAR BALLOON ANGIOPLASTY  09/12/2020   Procedure: PERIPHERAL VASCULAR BALLOON ANGIOPLASTY;  Surgeon: Wenona Hamilton, MD;  Location: [facility] INVASIVE CV LAB;  Service: Cardiovascular;;   REVERSE SHOULDER ARTHROPLASTY Left 10/01/2021   Procedure: REVERSE SHOULDER ARTHROPLASTY WITH BICEPS TENODESIS.;  Surgeon: Elner Hahn, MD;  Location: [facility] ORS;  Service: Orthopedics;  Laterality: Left;   SHOULDER SURGERY Left 2017   THORACIC LAMINECTOMY FOR SPINAL CORD STIMULATOR N/A 10/27/2023   Procedure: THORACIC LAMINECTOMY FOR SPINAL CORD STIMULATOR PLACEMENT WITH INTERNAL PULSE GENERATOR IMPLANT;  Surgeon: Carroll Clamp, MD;  Location: [facility] ORS;  Service: Neurosurgery;  Laterality: N/A;  THORACIC LAMINECTOMY FOR SPINAL CORD STIMULATOR PLACEMENT WITH INTERNAL PULSE GENERATOR IMPLANT    Family History  Problem Relation Age of Onset   Heart attack Mother        First MI @ 89 - Died @ 45   Heart disease Mother    Heart disease Father        Died @ 55   Throat cancer Brother    Liver cancer Brother    Colon cancer Sister     No Known Allergies  Current Outpatient Medications on File Prior to Visit  Medication Sig Dispense Refill   carvedilol  (COREG ) 3.125 MG tablet Take 1 tablet (3.125 mg total) by mouth 2 (two) times daily. 180  tablet 3   clobetasol  cream (TEMOVATE ) 0.05 % Apply 1 Application topically 2 (two) times daily as needed. For vaginal itching. 30 g 0   Continuous Glucose Receiver (FREESTYLE LIBRE 2 READER) DEVI Use with sensor to check blood sugars 6 times daily. 1 each 0   Continuous Glucose Sensor (FREESTYLE LIBRE 2 SENSOR) MISC USE ONE SENSOR EVERY 14 DAYS TO CHECK BLOOD SUGAR SIX TIMES PER DAY 6 each 1   docusate sodium  (COLACE) 100 MG capsule Take 1 capsule (100 mg total) by mouth 2 (two) times daily. 10 capsule 0   Evolocumab  (REPATHA  SURECLICK) 140 MG/ML SOAJ Inject 140 mg into the skin every 14 (fourteen) days. 6 mL 3   furosemide  (LASIX ) 20 MG tablet Take 1 tablet (20 mg total) by mouth as needed (As needed for weight gain greater than 3 pounds overnight or increased shortness of breath.). Take one tab only on Monday, Wednesday, & Fridays (Patient taking differently: Take 20 mg by mouth every Monday, Wednesday, and Friday.) 90 tablet 3   gabapentin  (NEURONTIN ) 300 MG capsule TAKE 1 CAPSULE BY MOUTH TWICE DAILY FOR PAIN 180 capsule 3   Glucagon  (GVOKE HYPOPEN  2-PACK) 1 MG/0.2ML SOAJ Inject 1 mg (1 pen) as needed for severe low blood sugar (sustained glucose less than 55 despite oral glucose treatments). May repeat in 15 minutes as needed. 0.4 mL 1   insulin  aspart (NOVOLOG  FLEXPEN) 100 UNIT/ML FlexPen INJECT 6-16 UNITS  THREE TIMES DAILY WITH MEALS PER  SLIDING  SCALE  FOR  DIABETES (Patient taking differently: Inject 5 Units into the skin 2 (two) times daily with a meal.) 45 mL 0   Insulin  Pen Needle (INSUPEN PEN NEEDLES) 32G X 4 MM MISC Use to inject insulin  4  times daily. 250 each 1   levothyroxine  (SYNTHROID ) 100 MCG tablet TAKE 1 TABLET BY MOUTH IN THE MORNING ON  AN  EMPTY  STOMACH  WITH  WATER   ONLY.  NO  FOOD  OR  OTHER  MEDICATIONS  FOR  30  MINUTES 90 tablet 1   mirtazapine  (REMERON ) 15 MG tablet Take 1 tablet (15 mg total) by mouth at bedtime. For sleep and depression (Patient taking differently:  Take 15 mg by mouth at bedtime as needed (sleep).) 90 tablet 3   ondansetron  (ZOFRAN ) 4 MG tablet Take 1 tablet (4 mg total) by mouth every 6 (six) hours as needed for nausea. 15 tablet 0   potassium chloride  (KLOR-CON  M) 10 MEQ tablet Only take on days you take your lasix . Take 1 tablet (10 mEq total) by mouth for 1 dose on those days you take your lasix  20mg  pill. (Patient taking differently: Take 10 mEq by mouth every Monday, Wednesday, and Friday.) 90 tablet 3   senna (SENOKOT) 8.6 MG TABS tablet Take 1 tablet (8.6 mg total) by mouth daily. 120 tablet 0   sitaGLIPtin  (JANUVIA ) 100 MG tablet Take 1 tablet (100 mg total) by mouth daily. for diabetes. 90 tablet 1   acetaminophen  (TYLENOL ) 650 MG CR tablet Take 1,300 mg by mouth 2 (two) times daily. (Patient not taking: Reported on 12/23/2023)     isosorbide  mononitrate (IMDUR ) 30 MG 24 hr tablet Take 30 mg by mouth daily. (Patient not taking: Reported on 12/23/2023)     No current facility-administered medications on file prior to visit.    BP (!) 146/84   Pulse 72   Temp (!) 97.2 F (36.2 C) (Temporal)   Ht 5\' 6"  (1.676 m)   Wt 127 lb (57.6 kg)   SpO2 98%   BMI 20.50 kg/m  Objective:   Physical Exam Cardiovascular:     Rate and Rhythm: Normal rate and regular rhythm.  Pulmonary:     Effort: Pulmonary effort is normal.     Breath sounds: Normal breath sounds.  Musculoskeletal:     Cervical back: Neck supple.  Skin:    General: Skin is warm and dry.  Neurological:     Mental Status: She is alert and oriented to person, place, and time.  Psychiatric:        Mood and Affect: Mood normal.           Assessment & Plan:  Hypothyroidism, unspecified type Assessment & Plan: Repeat TSH pending. Continue levothyroxine  100 mcg daily.  Orders: -     TSH  Chronic heart failure with preserved ejection fraction (HCC) -     Clopidogrel  Bisulfate; Take 1 tablet (75 mg total) by mouth daily.  Dispense: 90 tablet; Refill:  0  Hyperlipidemia LDL goal <70 -     Atorvastatin  Calcium ; Take 1 tablet (80 mg total) by mouth daily. for cholesterol.  Dispense: 90 tablet; Refill: 0 -     Ezetimibe ; Take 1 tablet (10 mg total) by mouth daily. For cholesterol.  Dispense: 90 tablet; Refill: 0  Diabetes mellitus type 2 with peripheral artery disease (HCC) Assessment & Plan: Repeat A1c pending.  Continue Januvia  100 mg daily, NovoLog  sliding scale with meals.  Orders: -     Hemoglobin A1c  CAD S/P percutaneous coronary angioplasty Assessment & Plan: Resume clopidogrel  75 mg daily, atorvastatin  80 mg daily, Zetia  10 mg daily.  Refills provided.   Mixed hyperlipidemia Assessment & Plan: Confirmed that patient was not taking atorvastatin  or  Zetia  for lipid panel drawn February 2025.  Resume atorvastatin  80 mg daily, Zetia  10 mg daily. Refrain from Repatha  use for now, she has not started anyway.  Repeat lipid panel during next visit.  If LDL remains above goal then start Repatha .         Gabriel John, NP

## 2024-01-02 ENCOUNTER — Other Ambulatory Visit: Payer: Self-pay | Admitting: Primary Care

## 2024-01-02 DIAGNOSIS — E039 Hypothyroidism, unspecified: Secondary | ICD-10-CM

## 2024-01-02 DIAGNOSIS — E1151 Type 2 diabetes mellitus with diabetic peripheral angiopathy without gangrene: Secondary | ICD-10-CM

## 2024-01-05 ENCOUNTER — Other Ambulatory Visit: Payer: Self-pay | Admitting: Emergency Medicine

## 2024-01-05 DIAGNOSIS — Z79899 Other long term (current) drug therapy: Secondary | ICD-10-CM

## 2024-01-13 ENCOUNTER — Encounter: Payer: Self-pay | Admitting: Emergency Medicine

## 2024-01-14 ENCOUNTER — Other Ambulatory Visit: Payer: Self-pay | Admitting: Primary Care

## 2024-01-14 DIAGNOSIS — E1151 Type 2 diabetes mellitus with diabetic peripheral angiopathy without gangrene: Secondary | ICD-10-CM

## 2024-01-18 ENCOUNTER — Ambulatory Visit: Admitting: Neurosurgery

## 2024-01-18 ENCOUNTER — Encounter: Payer: Self-pay | Admitting: Neurosurgery

## 2024-01-18 VITALS — BP 134/84 | Temp 98.2°F | Ht 66.0 in | Wt 122.0 lb

## 2024-01-18 DIAGNOSIS — Z9689 Presence of other specified functional implants: Secondary | ICD-10-CM

## 2024-01-18 DIAGNOSIS — M961 Postlaminectomy syndrome, not elsewhere classified: Secondary | ICD-10-CM

## 2024-01-18 NOTE — Progress Notes (Signed)
   REFERRING PHYSICIAN:  Gretta Comer POUR, Np [address] [place],  [place] [address]  DOS: 10/27/23 SCS placement [facility])  HISTORY OF PRESENT ILLNESS: [name] is approximately 9 weeks status post SCS placement.  She was able to be programmed at her last visit.  Since that time she has had a significant improvement in her symptoms.  Overall she is pleased.  Her pain level is down to approximately a 2 or 3.  PHYSICAL EXAMINATION:  General: Patient is well developed, well nourished, calm, collected, and in no apparent distress.   NEUROLOGICAL:  General: In no acute distress.   Awake, alert, oriented to person, place, and time.  Pupils equal round and reactive to light.  Facial tone is symmetric.    Strength:            Side Iliopsoas Quads Hamstring PF DF EHL  R 5 5 5 5 5 5   L 5 5 5 5 5 5    Incisions c/d/i   ROS (Neurologic):  Negative except as noted above  IMAGING: No interval imaging to review  ASSESSMENT/PLAN:  Sarah Payne is doing fair approximately 9 weeks after SCS.  Thankfully she has had the ability to get her programming.  And she is improving significantly from her pain overall.  She states that she is down to approximately a 2 or 3 overall and is quite pleased.  Will continue to follow.  She is healing well.  She looks forward to traveling to see her daughter soon.  Advised to contact the office if any questions or concerns arise.  Penne MICAEL Sharps, MD Department of neurosurgery

## 2024-01-20 ENCOUNTER — Other Ambulatory Visit: Admitting: Pharmacist

## 2024-01-20 DIAGNOSIS — E119 Type 2 diabetes mellitus without complications: Secondary | ICD-10-CM

## 2024-01-20 NOTE — Progress Notes (Signed)
 01/25/2024 Name: Sarah Payne MRN: [id number] DOB: Jul 20, 1946  Chief Complaint  Patient presents with   Diabetes   Sarah Payne is a 78 y.o. year old female who presented for a pharmacotherapy telephone visit though presented for a phone visit. Seen in office. They were referred to the pharmacist by their PCP for assistance in managing diabetes.   Diabetes Complications: peripheral neuropathy, DKD  PMH also significant for CAD, CHF/takotsubo, HTN, hx postural dizziness/near-syncope, OSA, HLD, COPD, OSA, hypothyroidism, CKD, hyperlipidemia, diabetic neuropathy, tobacco dependence.  Last Diabetes-Related Visit: 12/23/23 with PCP, 11/04/23 with Pharmacist Summary of Recent Change:  3/18: Due to hypoglycemia, use Novolog  w lunch meal only unless having carb for dinner. Emphasized importance of using Novolog  before/up to time of lunch meal to prevent spikes rather than treat them 2/13: FGB 140 >240 s/p coffee; pre-lunch 110>240 s/p lunch followed by 40s-50s; HS 110. Reports unintentional wt loss -25lb. Start Januvia  100 mg  Care Team: Primary Care Provider: Gretta Comer MARLA, NP  Cardiologist: Sarah Lunger, MD; Last visit 09/11/23  Medication Access/Adherence Patient reports affordability concerns with their medications: No  Patient reports access/transportation concerns to their pharmacy: No  Patient reports adherence concerns with their medications: No Last PCP visit 12/23/23, reported confusion with cardiology regimen. Reported not taking: atorvastatin , ezetimibe , Imdur , clopidogrel , Repatha . Also was not taking famotidine , omeprazole , duloxetine   Today, reports no further concerns. Daughter and patient report all medications have been filled/obtained. No further questions/concerns.    Diabetes: Subjective: Notes doing well over the last month. Yesterday, has changed the way she uses her Novolog  per previous discussions. Previously had habit of using after her meal a meal rather  than prior resulting in intermittent low sugars.   Current medications:  Januvia  100 mg daily Novolog  5 units AC (reports using w lunch, dinner)  Medications previously tried: glipizide , metformin  IR (diarrhea), Trulicity (cost)  SMBG: CGM (Libre 2) - With reader Time in target (Last 7 Days): Above 33%; In target 65%; Below 2% Average glucose (Last 7 Days): 162 mg/dL Average glucose by time of day:     12 am - 6 am: 128        6 am - 12 pm: 182       6 am - 12 pm: 181      12 pm - 6 pm: 164  Low Glucose Events (7 days): 2 total events 1 (12pm-6pm) 1 (6pm-12am)  Diet:  Breakfast: skips. Coffee with 3 Splenda and half/half  Lunch (12-12:30 pm): Skips or has a sandwich (estimates eats lunch ~3 days per week) sandwich (tomatoes), sometimes chips, bologna sandwich Dinner (6:30 pm): meat and 2 vegetables (low appetite, often will make dinner and find she is full after several bites, no carb side - Never finishes a full meal) Snacks: May have small handful of chips Beverages: Sweet tea (makes her own w regular sugar)   DM Prevention:  Statin: Taking; high intensity.?  History of chronic kidney disease? yes History of albuminuria? yes, last UACR on 09/03/23 = 259 mg/g (increased from 2024 <30) ACE/ARB - Not taking; Urine MA/CR Ratio - elevated urinary albumin excretion.  Last eye exam: 2022; No retinopathy present - DUE (patient confirms no eye exam since 2022) Last foot exam: 06/03/2023 Tobacco Use: Current every day smoker (declines pharmacotherapy - discussed w cardiology)  Immunizations:? Flu: Up to date (Last: 06/03/2023); Pneumococcal: Up to Date; Shingrix: Up to date (Last: 01/07/2022, 02/15/2021)  Cardiovascular Risk Reduction History of clinical ASCVD? yes  The ASCVD Risk score (Arnett DK, et al., 2019) failed to calculate for the following reasons:   Risk score cannot be calculated because patient has a medical history suggesting prior/existing ASCVD History of heart  failure? yes (cardiology follows) History of hyperlipidemia? yes (cardiology follows) Current BMI: 20.3 kg/m2 (Ht 5' 6.5, Wt 58.1 kg) Taking statin? yes; high intensity (atorvastatin  80 mg) Taking aspirin ? indicated (secondary prevention); Taking Plavix  75 mg daily (cardiology follows)   Taking SGLT-2i? no Taking GLP- 1 RA? no   Heart Failure: - cardiology Current medications:  ACEi/ARB/ARNI: none, hypotension/orthostasis at baseline SGLT2i: None, c/f hypotension/orthostasis Beta blocker: carvedilol  3.125 mg twice daily. Titration limited by bradycardia Mineralocorticoid Receptor Antagonist: none Diuretic regimen: furosemide  20 mg MWF + K 10mEq MWF     _______________________________________________  Objective    Review of Systems:? Limited in the setting of virtual visit  Constitutional:? No fever, chills. Cardiovascular:? No chest pain or pressure, shortness of breath, dyspnea on exertion, orthopnea or LE edema  Pulmonary:? No shortness of breath  GI:? No nausea, vomiting, constipation, diarrhea, abdominal pain, dyspepsia, change in bowel habits  Endocrine:? No polyuria, polyphagia or blurred vision    Physical Examination:  Vitals:  Wt Readings from Last 3 Encounters:  01/18/24 122 lb (55.3 kg)  12/23/23 127 lb (57.6 kg)  12/07/23 112 lb (50.8 kg)   BP Readings from Last 3 Encounters:  01/18/24 134/84  12/23/23 (!) 146/84  12/07/23 138/88   Pulse Readings from Last 3 Encounters:  12/23/23 72  10/29/23 98  10/27/23 (!) 52    Labs:?  Lab Results  Component Value Date   HGBA1C 7.7 (H) 12/23/2023   HGBA1C 8.5 (A) 09/03/2023   HGBA1C 8.0 (A) 06/03/2023   GLUCOSE 275 (H) 10/29/2023   MICRALBCREAT 259.0 (H) 09/03/2023   CREATININE 1.34 (H) 10/29/2023   CREATININE 1.45 (H) 10/19/2023   CREATININE 1.20 03/03/2023   GFR 44.44 (L) 03/03/2023   GFR 42.42 (L) 10/28/2022   GFR 26.69 (L) 09/19/2022    Lab Results  Component Value Date   CHOL 221 (H) 09/11/2023    LDLCALC 134 (H) 09/11/2023   LDLCALC 93 03/03/2023   LDLCALC 32 08/28/2022   LDLDIRECT 52 08/28/2022   LDLDIRECT 143.0 12/24/2018   HDL 67 09/11/2023   TRIG 112 09/11/2023   TRIG 158.0 (H) 03/03/2023   TRIG 213 (H) 08/28/2022   ALT 9 10/28/2022   ALT 235 (H) 09/24/2022   AST 14 10/28/2022   AST 94 (H) 09/24/2022      Chemistry      Component Value Date/Time   NA 128 (L) 10/29/2023 2015   NA 137 09/24/2022 1421   NA 136 08/02/2014 0408   K 4.0 10/29/2023 2015   K 3.1 (L) 08/02/2014 0408   CL 92 (L) 10/29/2023 2015   CL 100 08/02/2014 0408   CO2 22 10/29/2023 2015   CO2 28 08/02/2014 0408   BUN 39 (H) 10/29/2023 2015   BUN 22 09/24/2022 1421   BUN 15 08/02/2014 0408   CREATININE 1.34 (H) 10/29/2023 2015   CREATININE 1.06 (H) 04/04/2016 1721      Component Value Date/Time   CALCIUM  9.3 10/29/2023 2015   CALCIUM  8.5 08/02/2014 0408   ALKPHOS 86 10/28/2022 1127   ALKPHOS 73 06/12/2014 1703   AST 14 10/28/2022 1127   AST 21 06/12/2014 1703   ALT 9 10/28/2022 1127   ALT 16 06/12/2014 1703   BILITOT 0.4 10/28/2022 1127   BILITOT <0.2 09/24/2022 1421  BILITOT 0.3 06/12/2014 1703        Assessment and Plan:   # Diabetes, type 2: uncontrolled per last A1c of 7.7% (12/23/23), improved from from 8.5%. Reasonable goal < 7.5% (age, comorbidities). Reports personal goal to start using Novolog  prior to meals rather than after as previously discussed. Is starting to recognize lows occurring only after using Novolog  after meals. CGM report today shows no c/f lows overnight. No lows yesterday with taking her Novolog  as instructed.   - Current Regimen: Januvia  100 mg daily, Novolog  5 units AC (using twice daily) - Diet: low intake (does not eat full meals, reports only bites - 1-2 times per day. Low/no carb dinners) - Exercise: None  - HCM: Due for eye exam. Encouraged patient to schedule Continue current medications. Emphasis on Novolog  only before meals Reviewed s/sx/tx  hypoglycemia (1/2 can soda, 3-4 glucose tabs)   # Kidney Disease (G3:A2): untreated/uncontrolled. CKD staging warrants Nephrology referral if UACR remains elevated on repeat as ordered by PCP. Not currently on RAAS or SGLT2i due to complex history of positional dizziness with pre-syncope. Needs to be on renal protective therapies given evidence of decline in renal health per drastic increase in proteinuria + chronic reduced eGFR though would likely require adjustment in other CHF medications as to avoid hypotension (managed by cards). Current Regimen: N/A Continue medications without changes.  Future Consideration: Nephrology Referral RAASi: Strong recommendation to slow decline of renal function and to reduce albuminuria.  SGLT2i: Consider risk/benefit if UACR remains elevated despite max tolerated RAAS. SGLT2i beneficial regardless given incontrolled DM though c/f orthostatic intolerance noted previously. Patient does not drink a lot of water , skips meals frequently. C/f risk euglycemic DKA.    Follow Up PCP visit scheduled 2 months Pharm f/u ~1 month via telephone   Future Appointments  Date Time Provider Department Center  01/27/2024 11:00 AM Devra Lands, RN [facility] None  02/24/2024  9:00 AM [facility] PHARMACIST [facility]  03/24/2024  9:00 AM Sarah Comer POUR, NP [facility]  10/25/2024  1:00 PM [facility] ANNUAL WELLNESS VISIT 2 [facility]   Manuelita FABIENE Kobs, PharmD Clinical Pharmacist [facility] [phone]

## 2024-01-27 ENCOUNTER — Other Ambulatory Visit: Payer: Self-pay

## 2024-01-27 NOTE — Patient Instructions (Signed)
 Visit Information  Thank you for taking time to visit with me today. Please don't hesitate to contact me if I can be of assistance to you before our next scheduled appointment.  Your next care management appointment is no further scheduled appointments.    Patient has met all care management goals. Care Management case will be closed. Patient has been provided contact information should new needs arise.   Please call the care guide team at [phone] if you need to cancel, schedule, or reschedule an appointment.   Please call the Suicide and Crisis Lifeline: 988 call the [place]  National Suicide Prevention Lifeline: 952-566-7162 or TTY: 220 697 9543 TTY 732-289-1145) to talk to a trained counselor call 1-800-273-TALK (toll free, 24 hour hotline) go to [facility] [address], [place] [phone]) call 911 if you are experiencing a Mental Health or Behavioral Health Crisis or need someone to talk to.  Nestora Duos, MSN, RN [facility], [facility] RN Care Manager Direct Dial: [phone] Fax: [phone]

## 2024-02-24 ENCOUNTER — Other Ambulatory Visit (INDEPENDENT_AMBULATORY_CARE_PROVIDER_SITE_OTHER): Admitting: Pharmacist

## 2024-02-24 DIAGNOSIS — E11649 Type 2 diabetes mellitus with hypoglycemia without coma: Secondary | ICD-10-CM

## 2024-02-24 DIAGNOSIS — Z794 Long term (current) use of insulin: Secondary | ICD-10-CM

## 2024-02-24 NOTE — Progress Notes (Signed)
 02/29/2024 Name: Sarah Payne MRN: [id number] DOB: 05/30/1946  Chief Complaint  Patient presents with   Diabetes    Sarah Payne is a 78 y.o. year old female who presented for a pharmacotherapy telephone visit though presented for a phone visit. Seen in office. They were referred to the pharmacist by their PCP for assistance in managing diabetes.   Diabetes Complications: peripheral neuropathy, DKD  PMH also significant for CAD, CHF/takotsubo, HTN, hx postural dizziness/near-syncope, OSA, HLD, COPD, OSA, hypothyroidism, CKD, hyperlipidemia, diabetic neuropathy, tobacco dependence.  Last Diabetes-Related Visit: 12/23/23 with PCP, 11/04/23 with Pharmacist Summary of Recent Change:  6/4: A1c improved to 7.7% 3/18: Due to hypoglycemia, use Novolog  w lunch meal only unless having carb for dinner. Emphasized importance of using Novolog  before/up to time of lunch meal to prevent spikes rather than treat them 2/13: FGB 140 >240 s/p coffee; pre-lunch 110>240 s/p lunch followed by 40s-50s; HS 110. Reports unintentional wt loss -25lb. Start Januvia  100 mg  Care Team: Primary Care Provider: Gretta Comer MARLA, NP  Cardiologist: Evalene Lunger, MD; Last visit 09/11/23  Medication Access/Adherence Patient reports affordability concerns with their medications: No  Patient reports access/transportation concerns to their pharmacy: No  Patient reports adherence concerns with their medications: No Last PCP visit 12/23/23, reported confusion with cardiology regimen. Reported not taking: atorvastatin , ezetimibe , Imdur , clopidogrel , Repatha . Also was not taking famotidine , omeprazole , duloxetine   Today, reports no further concerns. Daughter and patient report all medications have been filled/obtained. No further questions/concerns.    Diabetes: Subjective: Notes doing well over the last month. Continues using Novolog  only prior to meals and reports no further concerns for low sugar alarms.   Current  medications:  Januvia  100 mg daily Novolog  8 units AC (lunch, dinner)  Medications previously tried: glipizide , metformin  IR (diarrhea), Trulicity (cost)  SMBG: CGM (Libre 2)  Time in target (Last 7 Days): Above 49%; In target 51%; Below 0% Average glucose (Last 7 Days): 187 mg/dL Average glucose by time of day:     12 am - 6 am: 188       6 am - 12 pm: 171      12 pm - 6 pm: 205      6 pm - 12 am: 185   Diet:  Breakfast: skips. Coffee with 3 Splenda and half/half  Lunch (12-12:30 pm): Skips or has a sandwich (estimates eats lunch ~3 days per week) sandwich (tomatoes), sometimes chips, bologna sandwich Dinner (6:30 pm): meat and 2 vegetables (low appetite, often will make dinner and find she is full after several bites, no carb side - Never finishes a full meal) Snacks: May have small handful of chips Beverages: Sweet tea (makes her own w regular sugar)   DM Prevention:  Statin: Taking; high intensity.?  History of chronic kidney disease? yes History of albuminuria? yes, last UACR on 09/03/23 = 259 mg/g (increased from 2024 <30) ACE/ARB - Not taking; Urine MA/CR Ratio - elevated urinary albumin excretion.  Last eye exam: 2022; No retinopathy present - DUE (patient confirms no eye exam since 2022) Last foot exam: 06/03/2023 Tobacco Use: Current every day smoker (declines pharmacotherapy - discussed w cardiology)  Immunizations:? Flu: Up to date (Last: 06/03/2023); Pneumococcal: Up to Date; Shingrix: Up to date (Last: 01/07/2022, 02/15/2021)  Cardiovascular Risk Reduction History of clinical ASCVD? yes History of heart failure? yes (cardiology follows) History of hyperlipidemia? yes (cardiology follows) Current BMI: 19.4 kg/m2 (Ht 5' 6.5, Wt 55.3 kg) Taking statin? yes; high  intensity (atorvastatin  80 mg) Taking aspirin ? indicated (secondary prevention); Taking Plavix  75 mg daily (cardiology follows)   Taking SGLT-2i? no Taking GLP- 1 RA? no      _______________________________________________  Objective    Review of Systems:? Limited in the setting of virtual visit  Constitutional:? No fever, chills. GI:? No nausea, vomiting, constipation, diarrhea, abdominal pain, dyspepsia, change in bowel habits  Endocrine:? No polyuria, polyphagia or blurred vision    Physical Examination:  Vitals:  Wt Readings from Last 3 Encounters:  01/18/24 122 lb (55.3 kg)  12/23/23 127 lb (57.6 kg)  12/07/23 112 lb (50.8 kg)   BP Readings from Last 3 Encounters:  01/18/24 134/84  12/23/23 (!) 146/84  12/07/23 138/88   Pulse Readings from Last 3 Encounters:  12/23/23 72  10/29/23 98  10/27/23 (!) 52    Labs:?  Lab Results  Component Value Date   HGBA1C 7.7 (H) 12/23/2023   HGBA1C 8.5 (A) 09/03/2023   HGBA1C 8.0 (A) 06/03/2023   GLUCOSE 275 (H) 10/29/2023   MICRALBCREAT 259.0 (H) 09/03/2023   CREATININE 1.34 (H) 10/29/2023   CREATININE 1.45 (H) 10/19/2023   CREATININE 1.20 03/03/2023   GFR 44.44 (L) 03/03/2023   GFR 42.42 (L) 10/28/2022   GFR 26.69 (L) 09/19/2022    Lab Results  Component Value Date   CHOL 221 (H) 09/11/2023   LDLCALC 134 (H) 09/11/2023   LDLCALC 93 03/03/2023   LDLCALC 32 08/28/2022   LDLDIRECT 52 08/28/2022   LDLDIRECT 143.0 12/24/2018   HDL 67 09/11/2023   TRIG 112 09/11/2023   TRIG 158.0 (H) 03/03/2023   TRIG 213 (H) 08/28/2022   ALT 9 10/28/2022   ALT 235 (H) 09/24/2022   AST 14 10/28/2022   AST 94 (H) 09/24/2022      Chemistry      Component Value Date/Time   NA 128 (L) 10/29/2023 2015   NA 137 09/24/2022 1421   NA 136 08/02/2014 0408   K 4.0 10/29/2023 2015   K 3.1 (L) 08/02/2014 0408   CL 92 (L) 10/29/2023 2015   CL 100 08/02/2014 0408   CO2 22 10/29/2023 2015   CO2 28 08/02/2014 0408   BUN 39 (H) 10/29/2023 2015   BUN 22 09/24/2022 1421   BUN 15 08/02/2014 0408   CREATININE 1.34 (H) 10/29/2023 2015   CREATININE 1.06 (H) 04/04/2016 1721      Component Value Date/Time   CALCIUM   9.3 10/29/2023 2015   CALCIUM  8.5 08/02/2014 0408   ALKPHOS 86 10/28/2022 1127   ALKPHOS 73 06/12/2014 1703   AST 14 10/28/2022 1127   AST 21 06/12/2014 1703   ALT 9 10/28/2022 1127   ALT 16 06/12/2014 1703   BILITOT 0.4 10/28/2022 1127   BILITOT <0.2 09/24/2022 1421   BILITOT 0.3 06/12/2014 1703        Assessment and Plan:   # Diabetes, type 2: uncontrolled per last A1c of 7.7% (12/23/23), improved from from 8.5%. Reasonable goal < 7.5% (age, comorbidities). Reports personal goal to start using Novolog  prior to meals rather than after as previously discussed. Is starting to recognize lows occurring only after using Novolog  after meals. CGM report today shows no c/f lows overnight. No lows yesterday with taking her Novolog  as instructed.   - Current Regimen: Januvia  100 mg daily, Novolog  5 units AC (using twice daily) - Diet: low intake (does not eat full meals, reports only bites - 1-2 times per day. Low/no carb dinners) - Exercise: None  -  HCM: Due for eye exam. Encouraged patient to schedule Continue current medications. Emphasis on Novolog  only before meals Discussed dietary goals as to help stabilize BG through the day including ensuring meals are balanced w macronutrients. Ensure pairing carb-rich foods with healthy protein source. Stevia in place of sugar in sweet tea. Reviewed s/sx/tx hypoglycemia (1/2 can soda, 3-4 glucose tabs)  Future Consideration: PCP goal has to been to slowly wean off insulin . Januvia  added previously to help with this though BG continue to worsen. Suspect primary 2/2 diet. Not eating a lot of food, though snack/meal choices still drive hyperglycemia.  Basal Insulin : Ideal agent in the setting of renal dysfunction though goal has been to get off of insulin . If dietary patterns do not change, will likely need additional of small basal dose.  Metformin : Not appropriate in the setting of renal dysfunction.  GLP1: Not appropriate in the setting of low intake w  continued weight loss.  TZD: Not appropriate in the setting of CHF SU: Risk>benefit for hypoglycemia while on insulin  + regularly skips meals.    # Kidney Disease (G3:A2): untreated/uncontrolled. CKD staging warrants Nephrology referral if UACR remains elevated on repeat as ordered by PCP. Not currently on RAAS or SGLT2i due to complex history of positional dizziness with pre-syncope. Needs to be on renal protective therapies given evidence of decline in renal health per drastic increase in proteinuria + chronic reduced eGFR though would likely require adjustment in other CHF medications as to avoid hypotension (managed by cards). Current Regimen: N/A Continue medications without changes.  Future Consideration: Nephrology Referral RAASi: Strong recommendation to slow decline of renal function and to reduce albuminuria though limited in the setting of low pressures/dizziness/near syncope SGLT2i: SGLT2i beneficial regardless given uncontrolled DM though c/f orthostatic intolerance noted previously. Patient does not drink a lot of water , skips meals frequently. C/f risk euglycemic DKA.    Follow Up PCP visit scheduled 1 month Pharm f/u ~2 month via telephone   Future Appointments  Date Time Provider Department Center  03/24/2024  9:00 AM Gretta Comer POUR, NP [facility]  04/21/2024  1:00 PM [facility] PHARMACIST [facility]  10/25/2024  1:00 PM [facility] ANNUAL WELLNESS VISIT 2 [facility]   Manuelita FABIENE Kobs, PharmD Clinical Pharmacist [facility] [phone]

## 2024-03-24 ENCOUNTER — Encounter: Payer: Self-pay | Admitting: Primary Care

## 2024-03-24 ENCOUNTER — Ambulatory Visit: Payer: Self-pay | Admitting: Primary Care

## 2024-03-24 ENCOUNTER — Ambulatory Visit: Admitting: Primary Care

## 2024-03-24 VITALS — BP 132/84 | HR 83 | Temp 97.6°F | Ht 66.0 in | Wt 125.0 lb

## 2024-03-24 DIAGNOSIS — Z0001 Encounter for general adult medical examination with abnormal findings: Secondary | ICD-10-CM | POA: Diagnosis not present

## 2024-03-24 DIAGNOSIS — K219 Gastro-esophageal reflux disease without esophagitis: Secondary | ICD-10-CM

## 2024-03-24 DIAGNOSIS — E1151 Type 2 diabetes mellitus with diabetic peripheral angiopathy without gangrene: Secondary | ICD-10-CM

## 2024-03-24 DIAGNOSIS — I5181 Takotsubo syndrome: Secondary | ICD-10-CM

## 2024-03-24 DIAGNOSIS — N9489 Other specified conditions associated with female genital organs and menstrual cycle: Secondary | ICD-10-CM | POA: Diagnosis not present

## 2024-03-24 DIAGNOSIS — L299 Pruritus, unspecified: Secondary | ICD-10-CM | POA: Insufficient documentation

## 2024-03-24 DIAGNOSIS — G8929 Other chronic pain: Secondary | ICD-10-CM

## 2024-03-24 DIAGNOSIS — G4700 Insomnia, unspecified: Secondary | ICD-10-CM

## 2024-03-24 DIAGNOSIS — F4323 Adjustment disorder with mixed anxiety and depressed mood: Secondary | ICD-10-CM

## 2024-03-24 DIAGNOSIS — E039 Hypothyroidism, unspecified: Secondary | ICD-10-CM

## 2024-03-24 DIAGNOSIS — I110 Hypertensive heart disease with heart failure: Secondary | ICD-10-CM | POA: Diagnosis not present

## 2024-03-24 DIAGNOSIS — J432 Centrilobular emphysema: Secondary | ICD-10-CM

## 2024-03-24 DIAGNOSIS — E785 Hyperlipidemia, unspecified: Secondary | ICD-10-CM

## 2024-03-24 DIAGNOSIS — L900 Lichen sclerosus et atrophicus: Secondary | ICD-10-CM | POA: Diagnosis not present

## 2024-03-24 DIAGNOSIS — E782 Mixed hyperlipidemia: Secondary | ICD-10-CM | POA: Diagnosis not present

## 2024-03-24 DIAGNOSIS — I5022 Chronic systolic (congestive) heart failure: Secondary | ICD-10-CM

## 2024-03-24 DIAGNOSIS — F17200 Nicotine dependence, unspecified, uncomplicated: Secondary | ICD-10-CM

## 2024-03-24 DIAGNOSIS — G4733 Obstructive sleep apnea (adult) (pediatric): Secondary | ICD-10-CM

## 2024-03-24 DIAGNOSIS — I10 Essential (primary) hypertension: Secondary | ICD-10-CM

## 2024-03-24 DIAGNOSIS — G629 Polyneuropathy, unspecified: Secondary | ICD-10-CM

## 2024-03-24 DIAGNOSIS — M549 Dorsalgia, unspecified: Secondary | ICD-10-CM

## 2024-03-24 LAB — COMPREHENSIVE METABOLIC PANEL WITH GFR
ALT: 18 U/L (ref 0–35)
AST: 18 U/L (ref 0–37)
Albumin: 4.2 g/dL (ref 3.5–5.2)
Alkaline Phosphatase: 79 U/L (ref 39–117)
BUN: 19 mg/dL (ref 6–23)
CO2: 34 meq/L — ABNORMAL HIGH (ref 19–32)
Calcium: 9.3 mg/dL (ref 8.4–10.5)
Chloride: 99 meq/L (ref 96–112)
Creatinine, Ser: 1.35 mg/dL — ABNORMAL HIGH (ref 0.40–1.20)
GFR: 38.29 mL/min — ABNORMAL LOW (ref >60.00)
Glucose, Bld: 61 mg/dL — ABNORMAL LOW (ref 70–99)
Potassium: 3.3 meq/L — ABNORMAL LOW (ref 3.5–5.1)
Sodium: 141 meq/L (ref 135–145)
Total Bilirubin: 0.5 mg/dL (ref 0.2–1.2)
Total Protein: 6.7 g/dL (ref 6.0–8.3)

## 2024-03-24 LAB — LIPID PANEL
Cholesterol: 102 mg/dL (ref 0–200)
HDL: 43.8 mg/dL (ref >39.00)
LDL Cholesterol: 44 mg/dL (ref 0–99)
NonHDL: 58.63
Total CHOL/HDL Ratio: 2
Triglycerides: 75 mg/dL (ref 0.0–149.0)
VLDL: 15 mg/dL (ref 0.0–40.0)

## 2024-03-24 LAB — HEMOGLOBIN A1C: Hgb A1c MFr Bld: 9.2 % — ABNORMAL HIGH (ref 4.6–6.5)

## 2024-03-24 LAB — TSH: TSH: 1.21 u[IU]/mL (ref 0.35–5.50)

## 2024-03-24 MED ORDER — FREESTYLE LIBRE 2 READER DEVI: 0 refills | Status: AC

## 2024-03-24 MED ORDER — CLOBETASOL PROPIONATE 0.05 % EX CREA
1.0000 | TOPICAL_CREAM | Freq: Two times a day (BID) | CUTANEOUS | 0 refills | Status: AC | PRN
Start: 2024-03-24 — End: ?

## 2024-03-24 NOTE — Assessment & Plan Note (Signed)
 No concerns today.  Never connected with therapy, declines referral today.

## 2024-03-24 NOTE — Assessment & Plan Note (Signed)
 No use of CPAP in years. Declines further evaluation.

## 2024-03-24 NOTE — Assessment & Plan Note (Signed)
 Appears euvolemic today.  Follow with cardiology, office notes reviewed from February 2025. Continue carvedilol  3.125 mg twice daily, furosemide  20 mg as needed.

## 2024-03-24 NOTE — Assessment & Plan Note (Signed)
 Stable, no concerns today.  She declines lung cancer screening program.

## 2024-03-24 NOTE — Progress Notes (Signed)
 Subjective:    Patient ID: Sarah Payne, female    DOB: [birth year], 78 y.o.   MRN: [id number]  Sarah Payne is a very pleasant 78 y.o. female who presents today for complete physical and follow up of chronic conditions.  She would also like to discuss insomnia. She has difficulty falling asleep. She goes to bed around 10 pm each night, will lay in bed awake until 12 am or 1 am. She will also wake up at 3 am and 4 am, sometimes 5 am. She was managed on Trazodone  which helps but she cannot stand the taste. She is also managed on mirtazapine  15 mg which doesn't help. She is managed on gabapentin  300 mg in the AM which does not cause drowsiness.   She would also like to discuss scalp itching. Chronic itching for the last 3 months. She denies seeing skin flakes and dandruff. She's been using Head and Shoulders without improvement. She's also noticed a bump to the scalp. She denies changes in soaps, detergents, medications, new supplements.   Immunizations: -Tetanus: Completed in 2024 -Influenza: completed today -Shingles: Completed Shingrix series -Pneumonia: Completed Prevnar 13 in 2015, Pneumovax 23 in 2020  Diet: Fair diet.  Exercise: No regular exercise.  Eye exam: Completed several years ago  Dental exam: Completed this year   Mammogram: Completed years ago, declines. Bone Density Scan: Completed years ago  Colonoscopy: Completed in 2016, due 2019 and never completed. Declined several times over the years. Declines today given age.   Lung Cancer Screening: Completed in October 2022, underwent CT chest/abdomen/pelvis in February 2024. Declines today.  BP Readings from Last 3 Encounters:  03/24/24 132/84  01/18/24 134/84  12/23/23 (!) 146/84    Wt Readings from Last 3 Encounters:  03/24/24 125 lb (56.7 kg)  01/18/24 122 lb (55.3 kg)  12/23/23 127 lb (57.6 kg)        Review of Systems  Constitutional:  Negative for unexpected weight change.  HENT:  Negative for  rhinorrhea.   Respiratory:  Positive for shortness of breath. Negative for cough.   Cardiovascular:  Negative for chest pain.  Gastrointestinal:  Negative for constipation and diarrhea.  Genitourinary:  Negative for difficulty urinating.  Musculoskeletal:  Positive for arthralgias and back pain.  Skin:  Negative for rash.  Allergic/Immunologic: Negative for environmental allergies.  Neurological:  Negative for dizziness and headaches.  Psychiatric/Behavioral:  The patient is not nervous/anxious.          Past Medical History:  Diagnosis Date   Acute pain of left shoulder 01/17/2019   Aortic atherosclerosis (HCC)    CHF (congestive heart failure) (HCC) 09/16/2013   a.) TTE 09/16/2013: EF 55-60%; mod inferior HK, triv TR; G1DD. b.) TTE 01/02/2016: EF 25-30%; mid-apicalateroseptal, lat, inf, inferoseptal, apical akinesis; triv TR; G1DD. c. TTE 04/16/2016: EF 65%; G1DD. d.) TTE 05/02/2020: EF 50-55%; G2DD. e.) TTE 04/11/2021: EF 55%; G1DD.   Chronic low back pain with bilateral sciatica    Chronic painful diabetic neuropathy (HCC)    Chronic radicular lumbar pain    CKD (chronic kidney disease), stage III (HCC)    Clostridioides difficile infection 2015   COPD (chronic obstructive pulmonary disease) (HCC)    Coronary artery disease    a.) PCI 02/06/2011: 100% mLCx - DES x 3. b.) PCI 09/17/2011: 95% dRCA - DES x 1. c.) PCI 10/02/2011: 75% pLCX - DES x 1. d.) PCI 08/19/2012: DES x 2 p-mRCA.  e.) LHC 01/01/16: Takotsubo event 01/01/2016 with patent  stents   Depression    Diverticulosis    Failed back syndrome    Frequent PVCs    a. Noted in hospital 12/2015.   GERD (gastroesophageal reflux disease)    History of bilateral cataract extraction 01/2015   History of heart artery stent    a.) TOTAL of 7 stents --> 02/06/2011 - overlapping 2.5 x 12 mm Xience V, 2.5 x 8 mm Xience V, 2.25 x 8 mm Xience Nano to mLCx; 09/17/2011 - 2.5 x 23 Xience V dRCA; 10/02/2011 - 2.5 x 15 mm Xience V pLCx;  08/09/2012 - overlapping 3.0 x 38mm mRCA and 3.5 x 23mm pRCA   Hyperlipidemia    Hypertension    Hypothyroidism    Impingement syndrome of left shoulder    LBBB (left bundle branch block)    Long term current use of clopidogrel     Marijuana abuse    Myocardial infarction [facility])    a.) multiple MIs;  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 12/2015 - nephew committed suicide 1 week prior, sister died the morning of presentation - initially called a STEMI; cath with patent stents. LVEF 25-30%.   Tendonitis of left rotator cuff    Tobacco abuse    Vaginal burning 03/22/2019   Vascular dementia     Social History   Socioeconomic History   Marital status: Married    Spouse name: Laurier   Number of children: Not on file   Years of education: Not on file   Highest education level: Not on file  Occupational History   Not on file  Tobacco Use   Smoking status: Every Day    Current packs/day: 1.00    Average packs/day: 1 pack/day for 45.0 years (45.0 ttl pk-yrs)    Types: Cigarettes   Smokeless tobacco: Never   Tobacco comments:    Has cut back, trying to quit.   Vaping Use   Vaping status: Some Days   Substances: Nicotine   Substance and Sexual Activity   Alcohol use: No    Alcohol/week: 0.0 standard drinks of alcohol   Drug use: Yes    Types: Marijuana    Comment: last noc   Sexual activity: Not on file  Other Topics Concern   Not on file  Social History Narrative   Lives at home with her husband in [place].  Previously used marijuana - quit.       Regular exercise: no/ pain from a frozen rotator cuff   Caffeine use: coffee daily and pepsi      Does not have a living will.   Daughters and husband know her wishes- would desire CPR but not prolonged life support if futile   Social Drivers of Corporate investment banker Strain: Low Risk  (10/22/2023)   Overall Financial Resource Strain (CARDIA)    Difficulty of Paying Living Expenses: Not hard at all  Food Insecurity: No Food Insecurity (01/27/2024)   Hunger Vital Sign  Worried About Programme researcher, broadcasting/film/video in the Last Year: Never true    Ran Out of Food in the Last Year: Never true  Transportation Needs: No Transportation Needs (01/27/2024)   PRAPARE - Administrator, Civil Service (Medical): No    Lack of Transportation (Non-Medical): No  Physical Activity: Insufficiently Active (10/22/2023)   Exercise Vital Sign    Days of Exercise per Week: 3 days    Minutes of Exercise per Session: 20 min  Stress: No Stress Concern Present (10/22/2023)   [employer] of Occupational Health - Occupational Stress Questionnaire    Feeling of Stress : Only a little  Social Connections: Moderately Isolated (10/22/2023)   Social Connection and Isolation Panel    Frequency of Communication with Friends and Family: Twice a week    Frequency of Social Gatherings with Friends and Family: Once a week    Attends Religious Services: Never    Database administrator or Organizations: No    Attends Banker Meetings: Never    Marital Status: Married  Catering manager Violence: Unknown (01/27/2024)   Humiliation, Afraid, Rape, and Kick questionnaire    Fear of Current or Ex-Partner: No    Emotionally Abused: No    Physically Abused: Not on file    Sexually Abused: No    Past Surgical History:  Procedure Laterality Date   ABDOMINAL AORTOGRAM W/LOWER EXTREMITY N/A 09/12/2020   Procedure: ABDOMINAL AORTOGRAM W/LOWER EXTREMITY;  Surgeon: Darron Deatrice LABOR, MD;  Location: [facility] INVASIVE CV  LAB;  Service: Cardiovascular;  Laterality: N/A;   ABDOMINAL HYSTERECTOMY     APPENDECTOMY     BACK SURGERY     CARDIAC CATHETERIZATION N/A 01/01/2016   Procedure: Left Heart Cath and Coronary Angiography;  Surgeon: Candyce GORMAN Reek, MD;  Location: [facility] INVASIVE CV LAB;  Service: Cardiovascular;  Laterality: N/A;   CATARACT EXTRACTION W/PHACO Left 01/30/2015   Procedure: CATARACT EXTRACTION PHACO AND INTRAOCULAR LENS PLACEMENT (IOC);  Surgeon: Elsie Carmine, MD;  Location: [facility] ORS;  Service: Ophthalmology;  Laterality: Left;  US  00:47    CATARACT EXTRACTION W/PHACO Right 02/13/2015   Procedure: CATARACT EXTRACTION PHACO AND INTRAOCULAR LENS PLACEMENT (IOC);  Surgeon: Elsie Carmine, MD;  Location: [facility] ORS;  Service: Ophthalmology;  Laterality: Right;  cassette lot # [id number] H US   00:29.9 AP  20.7 CDE  6.20   COLONOSCOPY N/A 11/02/2014   Procedure: COLONOSCOPY;  Surgeon: Lamar JONETTA Aho, MD;  Location: [facility] ENDOSCOPY;  Service: Endoscopy;  Laterality: N/A;   CORONARY ANGIOPLASTY WITH STENT PLACEMENT Left 02/06/2011   Procedure: CORONARY ANGIOPLASTY WITH STENT PLACEMENT; Location: [facility]; Surgeon: Margie Lovelace, MD   CORONARY ANGIOPLASTY WITH STENT PLACEMENT Left 09/17/2011   Procedure: CORONARY ANGIOPLASTY WITH STENT PLACEMENT; Location: [facility]; Surgeon: Margie Lovelace, MD   CORONARY ANGIOPLASTY WITH STENT PLACEMENT Left 10/02/2011   Procedure: CORONARY ANGIOPLASTY WITH STENT PLACEMENT; Location: [facility]; Surgeon: Margie Lovelace, MD   CORONARY ANGIOPLASTY WITH STENT PLACEMENT Left 08/19/2012   Procedure: CORONARY ANGIOPLASTY WITH STENT PLACEMENT; Location: [facility]; Surgeon: Deatrice Darron, MD   ESOPHAGOGASTRODUODENOSCOPY (EGD) WITH PROPOFOL  N/A 09/21/2018   Procedure: ESOPHAGOGASTRODUODENOSCOPY (EGD) WITH PROPOFOL ;  Surgeon: Therisa Bi, MD;  Location: [facility] ENDOSCOPY;  Service: Gastroenterology;  Laterality: N/A;   ESOPHAGOGASTRODUODENOSCOPY (EGD) WITH PROPOFOL  N/A 10/06/2022   Procedure:  ESOPHAGOGASTRODUODENOSCOPY (EGD) WITH PROPOFOL ;  Surgeon: Therisa Bi, MD;  Location: [facility] ENDOSCOPY;  Service: Gastroenterology;  Laterality: N/A;   EYE SURGERY     LEFT HEART CATH AND CORONARY ANGIOGRAPHY Left 08/02/2014  Procedure: LEFT HEART CATH AND CORONARY ANGIOGRAPHY; Location: [facility]; Surgeon: Alm Clay, MD   PERIPHERAL VASCULAR ATHERECTOMY Left 09/12/2020   Procedure: PERIPHERAL VASCULAR ATHERECTOMY;  Surgeon: Darron Deatrice LABOR, MD;  Location: [facility] INVASIVE CV LAB;  Service: Cardiovascular;  Laterality: Left;   PERIPHERAL VASCULAR BALLOON ANGIOPLASTY  09/12/2020   Procedure: PERIPHERAL VASCULAR BALLOON ANGIOPLASTY;  Surgeon: Darron Deatrice LABOR, MD;  Location: [facility] INVASIVE CV LAB;  Service: Cardiovascular;;   REVERSE SHOULDER ARTHROPLASTY Left 10/01/2021   Procedure: REVERSE SHOULDER ARTHROPLASTY WITH BICEPS TENODESIS.;  Surgeon: Edie Norleen PARAS, MD;  Location: [facility] ORS;  Service: Orthopedics;  Laterality: Left;   SHOULDER SURGERY Left 2017   THORACIC LAMINECTOMY FOR SPINAL CORD STIMULATOR N/A 10/27/2023   Procedure: THORACIC LAMINECTOMY FOR SPINAL CORD STIMULATOR PLACEMENT WITH INTERNAL PULSE GENERATOR IMPLANT;  Surgeon: Claudene Penne ORN, MD;  Location: [facility] ORS;  Service: Neurosurgery;  Laterality: N/A;  THORACIC LAMINECTOMY FOR SPINAL CORD STIMULATOR PLACEMENT WITH INTERNAL PULSE GENERATOR IMPLANT    Family History  Problem Relation Age of Onset   Heart attack Mother        First MI @ 67 - Died @ 24   Heart disease Mother    Heart disease Father        Died @ 37   Throat cancer Brother    Liver cancer Brother    Colon cancer Sister     No Known Allergies  Current Outpatient Medications on File Prior to Visit  Medication Sig Dispense Refill   acetaminophen  (TYLENOL ) 650 MG CR tablet Take 1,300 mg by mouth 2 (two) times daily.     atorvastatin  (LIPITOR ) 80 MG tablet Take 1 tablet (80 mg total) by mouth daily. for cholesterol. 90 tablet 0   carvedilol  (COREG ) 3.125 MG  tablet Take 1 tablet (3.125 mg total) by mouth 2 (two) times daily. 180 tablet 3   clopidogrel  (PLAVIX ) 75 MG tablet Take 1 tablet (75 mg total) by mouth daily. 90 tablet 0   Continuous Glucose Sensor (FREESTYLE LIBRE 2 SENSOR) MISC USE TO CHECK BLOOD SUGAR CHANGE EVERY 14 DAYS 6 each 0   docusate sodium  (COLACE) 100 MG capsule Take 1 capsule (100 mg total) by mouth 2 (two) times daily. 10 capsule 0   Evolocumab  (REPATHA  SURECLICK) 140 MG/ML SOAJ Inject 140 mg into the skin every 14 (fourteen) days. 6 mL 3   ezetimibe  (ZETIA ) 10 MG tablet Take 1 tablet (10 mg total) by mouth daily. For cholesterol. 90 tablet 0   furosemide  (LASIX ) 20 MG tablet Take 1 tablet (20 mg total) by mouth as needed (As needed for weight gain greater than 3 pounds overnight or increased shortness of breath.). Take one tab only on Monday, Wednesday, & Fridays 90 tablet 3   gabapentin  (NEURONTIN ) 300 MG capsule TAKE 1 CAPSULE BY MOUTH TWICE DAILY FOR PAIN 180 capsule 3   Glucagon  (GVOKE HYPOPEN  2-PACK) 1 MG/0.2ML SOAJ Inject 1 mg (1 pen) as needed for severe low blood sugar (sustained glucose less than 55 despite oral glucose treatments). May repeat in 15 minutes as needed. 0.4 mL 1   insulin  aspart (NOVOLOG  FLEXPEN) 100 UNIT/ML FlexPen INJECT 6-16 UNITS SUBCUTANEOUSLY THREE TIMES DAILY WITH MEALS PER  SLIDING  SCALE  FOR  DIABETES 45 mL 0   Insulin  Pen Needle (INSUPEN PEN NEEDLES) 32G X 4 MM MISC Use to inject insulin  4 times daily. 250 each 1   isosorbide  mononitrate (IMDUR ) 30 MG 24 hr tablet Take 30 mg by mouth daily.  levothyroxine  (SYNTHROID ) 100 MCG tablet TAKE 1 TABLET BY MOUTH IN THE MORNING ON  AN  EMPTY  STOMACH  WITH  WATER   ONLY.  NO  FOOD  OR  OTHER  MEDICATIONS  FOR  30  MINUTES 90 tablet 0   mirtazapine  (REMERON ) 15 MG tablet Take 1 tablet (15 mg total) by mouth at bedtime. For sleep and depression (Patient taking differently: Take 15 mg by mouth at bedtime as needed (sleep).) 90 tablet 3   ondansetron  (ZOFRAN )  4 MG tablet Take 1 tablet (4 mg total) by mouth every 6 (six) hours as needed for nausea. 15 tablet 0   potassium chloride  (KLOR-CON  M) 10 MEQ tablet Only take on days you take your lasix . Take 1 tablet (10 mEq total) by mouth for 1 dose on those days you take your lasix  20mg  pill. (Patient taking differently: Take 10 mEq by mouth every Monday, Wednesday, and Friday.) 90 tablet 3   senna (SENOKOT) 8.6 MG TABS tablet Take 1 tablet (8.6 mg total) by mouth daily. 120 tablet 0   sitaGLIPtin  (JANUVIA ) 100 MG tablet Take 1 tablet (100 mg total) by mouth daily. for diabetes. 90 tablet 1   No current facility-administered medications on file prior to visit.    BP 132/84   Pulse 83   Temp 97.6 F (36.4 C) (Temporal)   Ht 5' 6 (1.676 m)   Wt 125 lb (56.7 kg)   SpO2 97%   BMI 20.18 kg/m  Objective:   Physical Exam HENT:     Right Ear: Tympanic membrane and ear canal normal.     Left Ear: Tympanic membrane and ear canal normal.  Eyes:     Pupils: Pupils are equal, round, and reactive to light.  Cardiovascular:     Rate and Rhythm: Normal rate and regular rhythm.  Pulmonary:     Effort: Pulmonary effort is normal.     Breath sounds: Normal breath sounds.  Abdominal:     General: Bowel sounds are normal.     Palpations: Abdomen is soft.     Tenderness: There is no abdominal tenderness.  Musculoskeletal:        General: Normal range of motion.     Cervical back: Neck supple.  Skin:    General: Skin is warm and dry.     Findings: No erythema or rash.  Neurological:     Mental Status: She is alert and oriented to person, place, and time.     Cranial Nerves: No cranial nerve deficit.     Deep Tendon Reflexes:     Reflex Scores:      Patellar reflexes are 2+ on the right side and 2+ on the left side. Psychiatric:        Mood and Affect: Mood normal.     Physical Exam        Assessment & Plan:  Takotsubo cardiomyopathy Assessment & Plan: Following with cardiology, office notes  reviewed from February 2025.    Diabetes mellitus type 2 with peripheral artery disease (HCC) Assessment & Plan: Repeat A1c pending.  Reviewed pharmacy notes from August 2025.  Continue Novolog  5-10 units per sliding scale TID with meals. Continue Januvia  100 mg daily.  Follow-up in 3 to 6 months based on A1c result.   Orders: -     FreeStyle Libre 2 Reader; Use with sensor to check blood sugars 6 times daily.  Dispense: 1 each; Refill: 0 -     Hemoglobin A1c  Vaginal burning -  Clobetasol  Propionate; Apply 1 Application topically 2 (two) times daily as needed. For vaginal itching.  Dispense: 30 g; Refill: 0  Hyperlipidemia LDL goal <70 -     Lipid panel -     Comprehensive metabolic panel with GFR  Hypothyroidism, unspecified type Assessment & Plan: She is taking levothyroxine  correctly.  Continue levothyroxine  100 mcg daily. Repeat TSH pending.  Orders: -     TSH  Primary hypertension Assessment & Plan: Controlled.  Continue carvedilol  3.125 mg twice daily.   Chronic systolic congestive heart failure (HCC) Assessment & Plan: Appears euvolemic today.  Follow with cardiology, office notes reviewed from February 2025. Continue carvedilol  3.125 mg twice daily, furosemide  20 mg as needed.   Obstructive sleep apnea Assessment & Plan: No use of CPAP in years. Declines further evaluation.   Centrilobular emphysema (HCC) Assessment & Plan: Stable, no concerns today.  She declines lung cancer screening program.    Gastroesophageal reflux disease, unspecified whether esophagitis present Assessment & Plan: Controlled, no concerns today  Remain off treatment.   Peripheral polyneuropathy Assessment & Plan: No concerns today.  Continue gabapentin  300 mg once to twice daily.   Chronic bilateral low back pain with bilateral sciatica Assessment & Plan: Ongoing, no improvement with spinal cord stimulator.  Reviewed neurosurgery notes from June  2025. Continue gabapentin  300 mg once to twice daily.   Tobacco dependence Assessment & Plan: Declines lung cancer screening referral   Mixed hyperlipidemia Assessment & Plan: Repeat lipid panel pending.  Continue atorvastatin  80 mg daily, Zetia  10 mg daily. No longer on Repatha  due to injection pain.   Lichen sclerosus et atrophicus Assessment & Plan: Controlled.  Continue clobetasol  0.05% cream daily as needed.   Insomnia, unspecified type Assessment & Plan: Uncontrolled.  Increase mirtazapine  to 30 mg HS. Consider resuming trazodone  vs increase mirtazapine  to 45 mg at bedtime if needed.  She will update.   Encounter for annual general medical examination with abnormal findings in adult Assessment & Plan: Immunizations UTD. Influenza vaccine provided today.  Mammogram and bone density scan due, declines Colonoscopy declined. Now also over age.  Discussed the importance of a healthy diet and regular exercise in order for weight loss, and to reduce the risk of further co-morbidity.  Exam stable. Labs pending.  Follow up in 1 year for repeat physical.    Chronic bilateral back pain, unspecified back location Assessment & Plan: Ongoing.  Continue gabapentin  300 mg once to twice daily   Adjustment disorder with mixed anxiety and depressed mood Assessment & Plan: No concerns today.  Never connected with therapy, declines referral today.   Itchy scalp Assessment & Plan: Unclear etiology as there is no evidence of psoriasis/rashes/lice/etc on exam.  Will have her start retaking Claritin 10 mg daily. If no improvement then consider other topical agents.     Assessment and Plan Assessment & Plan         Comer MARLA Gaskins, NP     History of Present Illness

## 2024-03-24 NOTE — Assessment & Plan Note (Signed)
 Following with cardiology, office notes reviewed from February 2025.

## 2024-03-24 NOTE — Assessment & Plan Note (Signed)
 Unclear etiology as there is no evidence of psoriasis/rashes/lice/etc on exam.  Will have her start retaking Claritin 10 mg daily. If no improvement then consider other topical agents.

## 2024-03-24 NOTE — Assessment & Plan Note (Signed)
 Repeat lipid panel pending.  Continue atorvastatin  80 mg daily, Zetia  10 mg daily. No longer on Repatha  due to injection pain.

## 2024-03-24 NOTE — Assessment & Plan Note (Signed)
 Controlled.  Continue carvedilol  3.125 mg twice daily.

## 2024-03-24 NOTE — Assessment & Plan Note (Signed)
She is taking levothyroxine correctly. Continue levothyroxine 100 mcg daily.  Repeat TSH pending. 

## 2024-03-24 NOTE — Assessment & Plan Note (Addendum)
 Repeat A1c pending.  Reviewed pharmacy notes from August 2025.  Continue Novolog  5-10 units per sliding scale TID with meals. Continue Januvia  100 mg daily.  Follow-up in 3 to 6 months based on A1c result.

## 2024-03-24 NOTE — Assessment & Plan Note (Signed)
 Ongoing, no improvement with spinal cord stimulator.  Reviewed neurosurgery notes from June 2025. Continue gabapentin  300 mg once to twice daily.

## 2024-03-24 NOTE — Patient Instructions (Addendum)
 Stop by the lab prior to leaving today. I will notify you of your results once received.   Increase the mirtazapine  to 2 pills every evening at bedtime for sleep.   Start taking Claritin once daily for head itching.  Please schedule a follow up visit for 6 months for a diabetes check.  It was a pleasure to see you today!

## 2024-03-24 NOTE — Assessment & Plan Note (Signed)
 No concerns today.  Continue gabapentin  300 mg once to twice daily.

## 2024-03-24 NOTE — Assessment & Plan Note (Signed)
 Controlled.  Continue clobetasol  0.05% cream daily as needed.

## 2024-03-24 NOTE — Assessment & Plan Note (Signed)
 Immunizations UTD. Influenza vaccine provided today.  Mammogram and bone density scan due, declines Colonoscopy declined. Now also over age.  Discussed the importance of a healthy diet and regular exercise in order for weight loss, and to reduce the risk of further co-morbidity.  Exam stable. Labs pending.  Follow up in 1 year for repeat physical.

## 2024-03-24 NOTE — Assessment & Plan Note (Signed)
 Uncontrolled.  Increase mirtazapine  to 30 mg HS. Consider resuming trazodone  vs increase mirtazapine  to 45 mg at bedtime if needed.  She will update.

## 2024-03-24 NOTE — Assessment & Plan Note (Signed)
 Declines lung cancer screening referral

## 2024-03-24 NOTE — Assessment & Plan Note (Signed)
 Controlled, no concerns today  Remain off treatment.

## 2024-03-24 NOTE — Assessment & Plan Note (Signed)
 Ongoing.  Continue gabapentin  300 mg once to twice daily

## 2024-03-28 MED ORDER — TRESIBA FLEXTOUCH 100 UNIT/ML ~~LOC~~ SOPN: 5.0000 [IU] | PEN_INJECTOR | Freq: Every day | SUBCUTANEOUS | 0 refills | Status: DC

## 2024-04-01 ENCOUNTER — Other Ambulatory Visit: Payer: Self-pay | Admitting: Pharmacist

## 2024-04-01 DIAGNOSIS — E11649 Type 2 diabetes mellitus with hypoglycemia without coma: Secondary | ICD-10-CM

## 2024-04-01 NOTE — Progress Notes (Signed)
 Brief Telephone Documentation Reason for Call: New medication prescribed  Summary of Call: Patient confirms that Tresiba  insulin  was covered at [facility] for ~$30.  She used her first injection this morning, 5 units as instructed by PCP. Continues Novolog  at 10 units TIDAC.   Did not use Novolog  with breakfast today as she wasn't sure if she should use the two insulins so close together.   Denies low sugars in the past several weeks. Continues to use [place] with reader without issues/concerns.   Reviewed differences between basal and bolus insulins including onset of action. Assured her the Novolog  can be taken in the morning prior to breakfast as usual, even if she has given the Tresiba .   Follow Up: Phone f/u with PharmD next week to assess CGM s/p basal start today.   Manuelita FABIENE Kobs, PharmD Clinical Pharmacist [facility] [phone]

## 2024-04-06 ENCOUNTER — Other Ambulatory Visit (INDEPENDENT_AMBULATORY_CARE_PROVIDER_SITE_OTHER): Admitting: Pharmacist

## 2024-04-06 ENCOUNTER — Ambulatory Visit

## 2024-04-06 DIAGNOSIS — E1151 Type 2 diabetes mellitus with diabetic peripheral angiopathy without gangrene: Secondary | ICD-10-CM

## 2024-04-06 NOTE — Progress Notes (Signed)
 04/06/2024 Name: Sarah Payne MRN: [id number] DOB: October 16, 1945  Chief Complaint  Patient presents with   Diabetes    Sarah Payne is a 78 y.o. year old female who presented for a follow up pharmacotherapy telephone visit. They were referred to the pharmacist by their PCP for assistance in managing diabetes.   Diabetes Complications: peripheral neuropathy, DKD  PMH also significant for CAD, CHF/takotsubo, HTN, hx postural dizziness/near-syncope, OSA, HLD, COPD, OSA, hypothyroidism, CKD, hyperlipidemia, diabetic neuropathy, tobacco dependence.  Last Diabetes-Related Visit: 12/23/23 with PCP, 11/04/23 with Pharmacist Summary of Recent Change:  9/12: Start Tresiba  5 units daily 6/4: A1c improved to 7.7% 3/18: Due to hypoglycemia, use Novolog  w lunch meal only unless having carb for dinner. Emphasized importance of using Novolog  before/up to time of lunch meal to prevent spikes rather than treat them 2/13: FGB 140 >240 s/p coffee; pre-lunch 110>240 s/p lunch followed by 40s-50s; HS 110. Reports unintentional wt loss -25lb. Start Januvia  100 mg  Care Team: Primary Care Provider: Gretta Comer MARLA, NP  Cardiologist: Evalene Lunger, MD; Last visit 09/11/23  Medication Access/Adherence Patient reports affordability concerns with their medications: No  Patient reports access/transportation concerns to their pharmacy: No  Patient reports adherence concerns with their medications: No  Diabetes: Subjective: Notes doing well with new Tresiba  insulin  though denies changes to her blood sugar. Feels they remain elevated. Denies missed doses.    Current medications:  Tresiba  5 units each morning (first dose 9/12) Novolog  5 units AC (lunch, dinner) Januvia  100 mg daily  Medications previously tried: glipizide , metformin  IR (diarrhea), Trulicity (cost)  SMBG: CGM (Libre 2)  168 this morning Average glucose (Last 7 Days. 9/11-9/17): 181 mg/dL Average glucose by time of day:     12 am - 6  am: 162       6 am - 12 pm: 186      12 pm - 6 pm: 197      6 pm - 12 am: 184  Diet:  Breakfast: skips. Coffee with 3 Splenda and half/half  Lunch (12-12:30 pm): Skips or has a sandwich (estimates eats lunch ~3 days per week) sandwich (tomatoes), sometimes chips, bologna sandwich Dinner (6:30 pm): meat and 2 vegetables (low appetite, often will make dinner and find she is full after several bites, no carb side - Never finishes a full meal) Snacks: May have small handful of chips Beverages: Sweet tea (makes her own w regular sugar)    DM Prevention:  Statin: Taking; high intensity.?  History of chronic kidney disease? yes History of albuminuria? yes, last UACR on 09/03/23 = 259 mg/g (increased from 2024 <30) ACE/ARB - Not taking; Urine MA/CR Ratio - elevated urinary albumin excretion.  Last eye exam: 2022; No retinopathy present - DUE (patient confirms no eye exam since 2022) Last foot exam: 06/03/2023 Tobacco Use: Current every day smoker (declines pharmacotherapy - discussed w cardiology)  Immunizations:? Flu: Up to date (Last: 06/03/2023); Pneumococcal: Up to Date; Shingrix: Up to date (Last: 01/07/2022, 02/15/2021)  Cardiovascular Risk Reduction History of clinical ASCVD? yes History of heart failure? yes (cardiology follows) History of hyperlipidemia? yes (cardiology follows) Current BMI: 19.4 kg/m2 (Ht 5' 6.5, Wt 55.3 kg) Taking statin? yes; high intensity (atorvastatin  80 mg) Taking aspirin ? indicated (secondary prevention); Taking Plavix  75 mg daily (cardiology follows)   Taking SGLT-2i? no Taking GLP- 1 RA? no     _______________________________________________  Objective    Review of Systems:? Limited in the setting of virtual visit  Constitutional:? No fever, chills. GI:? No nausea, vomiting, constipation, diarrhea, abdominal pain, dyspepsia, change in bowel habits  Endocrine:? No polyuria, polyphagia or blurred vision    Physical Examination:  Vitals:  Wt  Readings from Last 3 Encounters:  03/24/24 125 lb (56.7 kg)  01/18/24 122 lb (55.3 kg)  12/23/23 127 lb (57.6 kg)   BP Readings from Last 3 Encounters:  03/24/24 132/84  01/18/24 134/84  12/23/23 (!) 146/84   Pulse Readings from Last 3 Encounters:  03/24/24 83  12/23/23 72  10/29/23 98    Labs:?  Lab Results  Component Value Date   HGBA1C 9.2 (H) 03/24/2024   HGBA1C 7.7 (H) 12/23/2023   HGBA1C 8.5 (A) 09/03/2023   GLUCOSE 61 (L) 03/24/2024   MICRALBCREAT 259.0 (H) 09/03/2023   CREATININE 1.35 (H) 03/24/2024   CREATININE 1.34 (H) 10/29/2023   CREATININE 1.45 (H) 10/19/2023   GFR 38.29 (L) 03/24/2024   GFR 44.44 (L) 03/03/2023   GFR 42.42 (L) 10/28/2022    Lab Results  Component Value Date   CHOL 102 03/24/2024   LDLCALC 44 03/24/2024   LDLCALC 134 (H) 09/11/2023   LDLCALC 93 03/03/2023   LDLDIRECT 52 08/28/2022   LDLDIRECT 143.0 12/24/2018   HDL 43.80 03/24/2024   TRIG 75.0 03/24/2024   TRIG 112 09/11/2023   TRIG 158.0 (H) 03/03/2023   ALT 18 03/24/2024   ALT 9 10/28/2022   AST 18 03/24/2024   AST 14 10/28/2022      Chemistry      Component Value Date/Time   NA 141 03/24/2024 0919   NA 137 09/24/2022 1421   NA 136 08/02/2014 0408   K 3.3 (L) 03/24/2024 0919   K 3.1 (L) 08/02/2014 0408   CL 99 03/24/2024 0919   CL 100 08/02/2014 0408   CO2 34 (H) 03/24/2024 0919   CO2 28 08/02/2014 0408   BUN 19 03/24/2024 0919   BUN 22 09/24/2022 1421   BUN 15 08/02/2014 0408   CREATININE 1.35 (H) 03/24/2024 0919   CREATININE 1.06 (H) 04/04/2016 1721      Component Value Date/Time   CALCIUM  9.3 03/24/2024 0919   CALCIUM  8.5 08/02/2014 0408   ALKPHOS 79 03/24/2024 0919   ALKPHOS 73 06/12/2014 1703   AST 18 03/24/2024 0919   AST 21 06/12/2014 1703   ALT 18 03/24/2024 0919   ALT 16 06/12/2014 1703   BILITOT 0.5 03/24/2024 0919   BILITOT <0.2 09/24/2022 1421   BILITOT 0.3 06/12/2014 1703        Assessment and Plan:   # Diabetes, type 2: uncontrolled per  last A1c of 9.2% (9/4), worsened from 7.7%. Reasonable goal < 7.5% (age, comorbidities) if unable to achieve <7% w/o hypoglycemia. Hx lows 2/2 incorrect prandial administration timing though not a concern over the past month. Pt confirms compliance w Tresiba  though notes has not changed her sugars. Fasting remaining ~160s or so. This morning 168. Will proceed with basal titration, 3 units.   - Current Regimen: Januvia  100 mg daily, Novolog  5 u ACBID, Tresiba  5 u/d - Diet: low intake (does not eat full meals, reports only bites - 1-2 times per day. Low/no carb dinners) - Exercise: None  - HCM: Due for eye exam.  Increase Tresiba  to 8 units once daily (+3 units) Discussed dietary goals as to help stabilize BG through the day including ensuring meals are balanced w macronutrients. Ensure pairing carb-rich foods with healthy protein source. Stevia in place of sugar in sweet tea.  Reviewed s/sx/tx hypoglycemia (1/2 can soda, 3-4 glucose tabs)  Future Consideration: Metformin : Not appropriate in the setting of renal dysfunction.  GLP1: Not appropriate in the setting of low intake w continued weight loss.  TZD: Not appropriate in the setting of CHF SU: Risk>benefit for hypoglycemia while on insulin  + regularly skips meals.    # Kidney Disease (G3:A2): untreated/uncontrolled. CKD staging warrants Nephrology referral if UACR remains significantly elevated on repeat as ordered by PCP. Not currently on RAAS or SGLT2i due to complex history of positional dizziness with pre-syncope. Needs to be on renal protective therapies given evidence of decline in renal health per drastic increase in proteinuria + chronic reduced eGFR though would likely require adjustment in other CHF medications as to avoid hypotension (managed by cards). Current Regimen: N/A Continue medications without changes.  Future Consideration: Nephrology Referral RAASi: Strong recommendation to slow decline of renal function and to reduce  albuminuria though limited in the setting of low pressures/dizziness/near syncope SGLT2i: SGLT2i beneficial regardless given uncontrolled DM though c/f orthostatic intolerance noted previously. Patient does not drink a lot of water , skips meals frequently. C/f risk euglycemic DKA.    Follow Up Pharm f/u via phone <1 week for insulin  titration   Future Appointments  Date Time Provider Department Center  04/11/2024 11:00 AM [facility] PHARMACIST [facility] [address]  06/24/2024 10:20 AM Gretta Comer POUR, NP [facility] [address]  09/21/2024  9:00 AM Gretta Comer POUR, NP [facility] [address]  10/25/2024  1:00 PM [facility] ANNUAL WELLNESS VISIT 2 [facility] [address]   Manuelita FABIENE Kobs, PharmD Clinical Pharmacist [facility] [phone]

## 2024-04-11 ENCOUNTER — Other Ambulatory Visit (INDEPENDENT_AMBULATORY_CARE_PROVIDER_SITE_OTHER): Admitting: Pharmacist

## 2024-04-11 DIAGNOSIS — E11649 Type 2 diabetes mellitus with hypoglycemia without coma: Secondary | ICD-10-CM

## 2024-04-11 DIAGNOSIS — Z794 Long term (current) use of insulin: Secondary | ICD-10-CM

## 2024-04-11 NOTE — Progress Notes (Signed)
 04/11/2024 Name: Sarah Payne MRN: [id number] DOB: [birth year]  Brief Insulin  Titration Visit  PMH also significant for CAD, CHF/takotsubo, HTN, hx postural dizziness/near-syncope, OSA, HLD, COPD, OSA, hypothyroidism, CKD, hyperlipidemia, diabetic neuropathy, tobacco dependence.  Summary of Recent Change:  9/17: ??Tresiba  8 u/d 9/12: Start Tresiba  5 units daily  Care Team: Primary Care Provider: Gretta Comer MARLA, NP  Cardiologist: Evalene Lunger, MD; Last visit 09/11/23  Diabetes: Subjective: Notes doing well with Tresiba  increase.  Notably, patient states she increased to 10 units once daily though feels sugars have been better controlled.  Current medications:  Tresiba   each morning (using 10 units) Novolog  5 units AC (lunch, dinner) Januvia  100 mg daily  SMBG: CGM (Libre 2)  Today: Time in target (Last 7 Days): Above 34%; In target 65%; Below 1% Average glucose (Last 7 Days): 161 mg/dL Average glucose by time of day:     12 am - 6 am: 142       6 am - 12 pm: 163      12 pm - 6 pm: 183      6 pm - 12 am: 158  Total Low events: 1 (12 am - 6am) - reports using Novolog  5 the evening before though skipped dinner  Last Visit: Average glucose (Last 7 Days. 9/11-9/17): 181 mg/dL Average glucose by time of day:     12 am - 6 am: 162       6 am - 12 pm: 186      12 pm - 6 pm: 197      6 pm - 12 am: 184  Diet:  Breakfast: skips. Coffee with 3 Splenda and half/half  Lunch (12-12:30 pm): Skips or has a sandwich (estimates eats lunch ~3 days per week) sandwich (tomatoes), sometimes chips, bologna sandwich Dinner (6:30 pm): meat and 2 vegetables (low appetite, often will make dinner and find she is full after several bites, no carb side - Never finishes a full meal) Snacks: May have small handful of chips Beverages: Sweet tea (makes her own w regular sugar)      _______________________________________________  Objective    Review of Systems:? Limited in the setting of  virtual visit  Constitutional:? No fever, chills. GI:? No nausea, vomiting, constipation, diarrhea, abdominal pain, dyspepsia, change in bowel habits  Endocrine:? No polyuria, polyphagia or blurred vision    Physical Examination:   Labs:?  Lab Results  Component Value Date   HGBA1C 9.2 (H) 03/24/2024   HGBA1C 7.7 (H) 12/23/2023   HGBA1C 8.5 (A) 09/03/2023   GLUCOSE 61 (L) 03/24/2024   MICRALBCREAT 259.0 (H) 09/03/2023   CREATININE 1.35 (H) 03/24/2024   CREATININE 1.34 (H) 10/29/2023   CREATININE 1.45 (H) 10/19/2023   GFR 38.29 (L) 03/24/2024   GFR 44.44 (L) 03/03/2023   GFR 42.42 (L) 10/28/2022   Lab Results  Component Value Date   CHOL 102 03/24/2024   LDLCALC 44 03/24/2024   LDLCALC 134 (H) 09/11/2023   LDLCALC 93 03/03/2023   LDLDIRECT 52 08/28/2022   LDLDIRECT 143.0 12/24/2018   HDL 43.80 03/24/2024   TRIG 75.0 03/24/2024   TRIG 112 09/11/2023   TRIG 158.0 (H) 03/03/2023   ALT 18 03/24/2024   ALT 9 10/28/2022   AST 18 03/24/2024   AST 14 10/28/2022     Chemistry      Component Value Date/Time   NA 141 03/24/2024 0919   NA 137 09/24/2022 1421   NA 136 08/02/2014 0408   K 3.3 (  L) 03/24/2024 0919   K 3.1 (L) 08/02/2014 0408   CL 99 03/24/2024 0919   CL 100 08/02/2014 0408   CO2 34 (H) 03/24/2024 0919   CO2 28 08/02/2014 0408   BUN 19 03/24/2024 0919   BUN 22 09/24/2022 1421   BUN 15 08/02/2014 0408   CREATININE 1.35 (H) 03/24/2024 0919   CREATININE 1.06 (H) 04/04/2016 1721      Component Value Date/Time   CALCIUM  9.3 03/24/2024 0919   CALCIUM  8.5 08/02/2014 0408   ALKPHOS 79 03/24/2024 0919   ALKPHOS 73 06/12/2014 1703   AST 18 03/24/2024 0919   AST 21 06/12/2014 1703   ALT 18 03/24/2024 0919   ALT 16 06/12/2014 1703   BILITOT 0.5 03/24/2024 0919   BILITOT <0.2 09/24/2022 1421   BILITOT 0.3 06/12/2014 1703        Assessment and Plan:   # Diabetes, type 2: uncontrolled per last A1c of 9.2% (9/4), worsened from 7.7%. Reasonable goal < 7.5%  (age, comorbidities) if unable to achieve <7% w/o hypoglycemia. Hx lows 2/2 incorrect prandial administration timing though not a concern over the past month.  Pt confirms compliance w Tresiba  though increased dose to 10 rather than 8. FBG have improved to 140 on average. Only 1 low noted on CGM which was 2/2 skipping dinner and still giving Novolog  5 units.  - Current Regimen: Januvia  100 mg daily, Novolog  5 u ACBID, Tresiba  10 u/d - Diet: low intake (does not eat full meals, reports only bites - 1-2 times per day. Low/no carb dinners) - Exercise: None  - HCM: Due for eye exam.  Continue Tresiba  10 units daily, Humalog 5 units BIDAC Reviewed importance of skipping Novolog  if skipping a meal. Only to be given prior to meals.   Future Consideration: Highest average between noon and 6pm each day suggesting possible benefit from increase in first dose of prandial. Meal patterns inconsistent  and Libre not connected to clinic making titration slightly trickier. At next visit, if afternoon sugars remain elevated on average, plan for slight prandial increase with first meal of the day. Main goal at this time = avoidance of hypoglycemia.     Follow Up Pharm f/u via phone 2 weeks for insulin  titration   Future Appointments  Date Time Provider Department Center  04/27/2024 11:00 AM [facility] PHARMACIST [facility] [address]  06/24/2024 10:20 AM Gretta Comer POUR, NP [facility] [address]  09/21/2024  9:00 AM Gretta Comer POUR, NP [facility] [address]  10/25/2024  1:00 PM [facility] ANNUAL WELLNESS VISIT 2 [facility] [address]   Manuelita FABIENE Kobs, PharmD Clinical Pharmacist [facility] [phone]

## 2024-04-13 ENCOUNTER — Emergency Department
Admission: EM | Admit: 2024-04-13 | Discharge: 2024-04-13 | Disposition: A | Discharge status: Home / Self Care | Attending: Emergency Medicine | Admitting: Emergency Medicine

## 2024-04-13 ENCOUNTER — Other Ambulatory Visit: Payer: Self-pay

## 2024-04-13 ENCOUNTER — Telehealth: Payer: Self-pay | Admitting: Cardiovascular Disease

## 2024-04-13 ENCOUNTER — Emergency Department

## 2024-04-13 DIAGNOSIS — E039 Hypothyroidism, unspecified: Secondary | ICD-10-CM | POA: Diagnosis not present

## 2024-04-13 DIAGNOSIS — I130 Hypertensive heart and chronic kidney disease with heart failure and stage 1 through stage 4 chronic kidney disease, or unspecified chronic kidney disease: Secondary | ICD-10-CM | POA: Insufficient documentation

## 2024-04-13 DIAGNOSIS — I2510 Atherosclerotic heart disease of native coronary artery without angina pectoris: Secondary | ICD-10-CM | POA: Insufficient documentation

## 2024-04-13 DIAGNOSIS — E119 Type 2 diabetes mellitus without complications: Secondary | ICD-10-CM | POA: Insufficient documentation

## 2024-04-13 DIAGNOSIS — Z955 Presence of coronary angioplasty implant and graft: Secondary | ICD-10-CM | POA: Diagnosis not present

## 2024-04-13 DIAGNOSIS — I509 Heart failure, unspecified: Secondary | ICD-10-CM | POA: Insufficient documentation

## 2024-04-13 DIAGNOSIS — N183 Chronic kidney disease, stage 3 unspecified: Secondary | ICD-10-CM | POA: Insufficient documentation

## 2024-04-13 DIAGNOSIS — I10 Essential (primary) hypertension: Secondary | ICD-10-CM | POA: Diagnosis not present

## 2024-04-13 DIAGNOSIS — J449 Chronic obstructive pulmonary disease, unspecified: Secondary | ICD-10-CM | POA: Insufficient documentation

## 2024-04-13 DIAGNOSIS — E1165 Type 2 diabetes mellitus with hyperglycemia: Secondary | ICD-10-CM | POA: Diagnosis not present

## 2024-04-13 DIAGNOSIS — I6782 Cerebral ischemia: Secondary | ICD-10-CM | POA: Diagnosis not present

## 2024-04-13 DIAGNOSIS — R519 Headache, unspecified: Secondary | ICD-10-CM | POA: Diagnosis present

## 2024-04-13 LAB — TROPONIN I (HIGH SENSITIVITY)
Troponin I (High Sensitivity): 31 ng/L — ABNORMAL HIGH (ref <18)
Troponin I (High Sensitivity): 31 ng/L — ABNORMAL HIGH (ref <18)

## 2024-04-13 LAB — COMPREHENSIVE METABOLIC PANEL WITH GFR
ALT: 21 U/L (ref 0–44)
AST: 27 U/L (ref 15–41)
Albumin: 3.7 g/dL (ref 3.5–5.0)
Alkaline Phosphatase: 71 U/L (ref 38–126)
Anion gap: 12 (ref 5–15)
BUN: 17 mg/dL (ref 8–23)
CO2: 24 mmol/L (ref 22–32)
Calcium: 9 mg/dL (ref 8.9–10.3)
Chloride: 101 mmol/L (ref 98–111)
Creatinine, Ser: 1.18 mg/dL — ABNORMAL HIGH (ref 0.44–1.00)
GFR, Estimated: 48 mL/min — ABNORMAL LOW (ref >60)
Glucose, Bld: 189 mg/dL — ABNORMAL HIGH (ref 70–99)
Potassium: 3.4 mmol/L — ABNORMAL LOW (ref 3.5–5.1)
Sodium: 137 mmol/L (ref 135–145)
Total Bilirubin: 1 mg/dL (ref 0.0–1.2)
Total Protein: 6.8 g/dL (ref 6.5–8.1)

## 2024-04-13 LAB — CBC WITH DIFFERENTIAL/PLATELET
Abs Immature Granulocytes: 0.01 K/uL (ref 0.00–0.07)
Basophils Absolute: 0 K/uL (ref 0.0–0.1)
Basophils Relative: 0 %
Eosinophils Absolute: 0.2 K/uL (ref 0.0–0.5)
Eosinophils Relative: 3 %
HCT: 40.5 % (ref 36.0–46.0)
Hemoglobin: 13.2 g/dL (ref 12.0–15.0)
Immature Granulocytes: 0 %
Lymphocytes Relative: 26 %
Lymphs Abs: 2.1 K/uL (ref 0.7–4.0)
MCH: 30.6 pg (ref 26.0–34.0)
MCHC: 32.6 g/dL (ref 30.0–36.0)
MCV: 93.8 fL (ref 80.0–100.0)
Monocytes Absolute: 0.5 K/uL (ref 0.1–1.0)
Monocytes Relative: 6 %
Neutro Abs: 5.2 K/uL (ref 1.7–7.7)
Neutrophils Relative %: 65 %
Platelets: 241 K/uL (ref 150–400)
RBC: 4.32 MIL/uL (ref 3.87–5.11)
RDW: 13 % (ref 11.5–15.5)
WBC: 7.9 K/uL (ref 4.0–10.5)
nRBC: 0 % (ref 0.0–0.2)

## 2024-04-13 MED ORDER — HYDRALAZINE HCL 50 MG PO TABS
25.0000 mg | ORAL_TABLET | Freq: Once | ORAL | Status: AC
  Administered 2024-04-13: 25 mg via ORAL
  Filled 2024-04-13: qty 1

## 2024-04-13 MED ORDER — AMLODIPINE BESYLATE 10 MG PO TABS: 10.0000 mg | ORAL_TABLET | Freq: Every day | ORAL | 0 refills | Status: DC

## 2024-04-13 MED ORDER — DIAZEPAM 5 MG PO TABS: 5.0000 mg | ORAL_TABLET | Freq: Two times a day (BID) | ORAL | 0 refills | Status: DC | PRN

## 2024-04-13 MED ORDER — AMLODIPINE BESYLATE 5 MG PO TABS
10.0000 mg | ORAL_TABLET | Freq: Once | ORAL | Status: AC
  Administered 2024-04-13: 10 mg via ORAL
  Filled 2024-04-13: qty 2

## 2024-04-13 NOTE — Telephone Encounter (Signed)
 Pt c/o BP issue: STAT if pt c/o blurred vision, one-sided weakness or slurred speech.  STAT if BP is GREATER than 180/120 TODAY.  STAT if BP is LESS than 90/60 and SYMPTOMATIC TODAY  1. What is your BP concern?   High BP readings and SOB  2. Have you taken any BP medication today?  Not yet  3. What are your last 5 BP readings?  181/133 this morning 233/135 last night  4. Are you having any other symptoms (ex. Dizziness, headache, blurred vision, passed out)?   Headache for the last 2 weeks, SOB,   Patient is concerned about next steps.

## 2024-04-13 NOTE — ED Notes (Signed)
 Pt reports being under a lot of extra stress recently dealing with family issues. Pt thinks that this extra stress has contributed to her uncontrolled HTN.

## 2024-04-13 NOTE — ED Notes (Signed)
 Pt given crackers and juice

## 2024-04-13 NOTE — Telephone Encounter (Signed)
 Received stat call. Patient with complaint of headache for 2 weeks. Patient states that her blood pressure last night was 233/135 and this morning 181/133. Patient also complains of intermittent shortness of breath. Recommended to patient to be evaluated in the Emergency Department. Patient verbalizes understanding.

## 2024-04-13 NOTE — ED Triage Notes (Signed)
 To ED for HTN. Pt takes Enalapril  and and took this AM.  233/125 last night. This morning it was 187/130 this morning. Then she checked before leaving the house and it was lower than the morning reading but doesn't remember what it was.  Pt states has had HA for about 1 week. Also tension in shoulders. Pt denies pain at this time.   Pt takes Plavix .

## 2024-04-13 NOTE — ED Provider Notes (Signed)
 [facility] Provider Note    Event Date/Time   First MD Initiated Contact with Patient 04/13/24 1014     (approximate)   History   Chief Complaint: Hypertension   HPI  Sarah Payne is a 78 y.o. female with a history of hypertension, diabetes, COPD, GERD who comes ED complaining of high blood pressure, 220/110 at home, associated with generalized headache, been symptomatic for the past week.  No neck pain.  Not thunderclap onset, but rather a slow gradual onset.  No vision changes, no dizziness or syncope, no fever.  Does also report some intermittent sharp chest discomfort which is not exertional, not pleuritic, not associated with shortness of breath diaphoresis vomiting or radiation.  Notes that she has been very stressed lately due to being in conflict with her 2 adult sons over the past couple of months.  They are not currently speaking with her, and she feels very upset.  She has been compliant with her medication regimen including Imdur , enalapril , Coreg .        Past Medical History:  Diagnosis Date   Acute pain of left shoulder 01/17/2019   Aortic atherosclerosis    CHF (congestive heart failure) (HCC) 09/16/2013   a.) TTE 09/16/2013: EF 55-60%; mod inferior HK, triv TR; G1DD. b.) TTE 01/02/2016: EF 25-30%; mid-apicalateroseptal, lat, inf, inferoseptal, apical akinesis; triv TR; G1DD. c. TTE 04/16/2016: EF 65%; G1DD. d.) TTE 05/02/2020: EF 50-55%; G2DD. e.) TTE 04/11/2021: EF 55%; G1DD.   Chronic low back pain with bilateral sciatica    Chronic painful diabetic neuropathy (HCC)    Chronic radicular lumbar pain    CKD (chronic kidney disease), stage III (HCC)    Clostridioides difficile infection 2015   COPD (chronic obstructive pulmonary disease) (HCC)    Coronary artery disease    a.) PCI 02/06/2011: 100% mLCx - DES x 3. b.) PCI 09/17/2011: 95% dRCA - DES x 1. c.) PCI 10/02/2011: 75% pLCX - DES x 1. d.) PCI 08/19/2012: DES x 2 p-mRCA.  e.) LHC  01/01/16: Takotsubo event 01/01/2016 with patent stents   Depression    Diverticulosis    Failed back syndrome    Frequent PVCs    a. Noted in hospital 12/2015.   GERD (gastroesophageal reflux disease)    History of bilateral cataract extraction 01/2015   History of heart artery stent    a.) TOTAL of 7 stents --> 02/06/2011 - overlapping 2.5 x 12 mm Xience V, 2.5 x 8 mm Xience V, 2.25 x 8 mm Xience Nano to mLCx; 09/17/2011 - 2.5 x 23 Xience V dRCA; 10/02/2011 - 2.5 x 15 mm Xience V pLCx; 08/09/2012 - overlapping 3.0 x 38mm mRCA and 3.5 x 23mm pRCA   Hyperlipidemia    Hypertension    Hypothyroidism    Impingement syndrome of left shoulder    LBBB (left bundle branch block)    Long term current use of clopidogrel     Marijuana abuse    Myocardial infarction [facility])    a.) multiple MIs;  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 12/2015 - nephew committed suicide 1 week prior, sister died the morning of presentation - initially called a STEMI; cath with patent stents. LVEF 25-30%.   Tendonitis of left rotator cuff    Tobacco abuse    Vaginal burning 03/22/2019   Vascular dementia     Current Outpatient Rx   Order #: [id number] Class: Normal   Order #: [id number] Class: Normal   Order #: [id number] Class: Historical Med   Order #: [id number] Class: Normal   Order #: [id number] Class: Normal   Order #: [id number] Class: Normal   Order #: [id number] Class: Normal   Order #: [id number] Class: Normal   Order #: [id number] Class: Normal   Order #: [id number] Class: Normal   Order #:  [id number] Class: Normal   Order #: [id number] Class: Normal   Order #: [id number] Class: Normal   Order #: [id number] Class: Normal   Order #: [id number] Class: Normal   Order #: [id number] Class: Normal   Order #: [id number] Class: Normal   Order #: [id number] Class: Normal   Order #: [id number] Class: Historical Med   Order #: [id number] Class: Normal   Order #: [id number] Class: Normal   Order #: [id number] Class: Normal   Order #: [id number] Class: Normal   Order #: [id number] Class: Normal   Order #: [id number] Class: Normal    Past Surgical History:  Procedure Laterality Date   ABDOMINAL AORTOGRAM W/LOWER EXTREMITY N/A 09/12/2020   Procedure: ABDOMINAL AORTOGRAM W/LOWER EXTREMITY;  Surgeon: Darron Deatrice LABOR, MD;  Location: [facility] INVASIVE CV LAB;  Service: Cardiovascular;  Laterality: N/A;   ABDOMINAL HYSTERECTOMY     APPENDECTOMY     BACK SURGERY     CARDIAC CATHETERIZATION N/A 01/01/2016   Procedure: Left Heart Cath and Coronary Angiography;  Surgeon: Candyce GORMAN Reek, MD;  Location: [facility] INVASIVE CV LAB;  Service: Cardiovascular;  Laterality: N/A;   CATARACT EXTRACTION W/PHACO Left 01/30/2015   Procedure: CATARACT EXTRACTION PHACO AND INTRAOCULAR LENS PLACEMENT (IOC);  Surgeon: Elsie Carmine, MD;  Location: [facility] ORS;  Service: Ophthalmology;  Laterality: Left;  US  00:47    CATARACT EXTRACTION W/PHACO Right 02/13/2015   Procedure: CATARACT EXTRACTION PHACO AND INTRAOCULAR LENS PLACEMENT (IOC);  Surgeon: Elsie Carmine, MD;  Location: [facility] ORS;  Service: Ophthalmology;  Laterality: Right;  cassette lot # [id number] H US   00:29.9 AP  20.7 CDE  6.20   COLONOSCOPY N/A 11/02/2014   Procedure: COLONOSCOPY;  Surgeon: Lamar JONETTA Aho, MD;  Location: [facility] ENDOSCOPY;  Service: Endoscopy;  Laterality: N/A;   CORONARY ANGIOPLASTY WITH STENT PLACEMENT Left 02/06/2011   Procedure: CORONARY ANGIOPLASTY WITH STENT PLACEMENT; Location: [facility]; Surgeon: Margie Lovelace, MD   CORONARY ANGIOPLASTY WITH STENT PLACEMENT Left  09/17/2011   Procedure: CORONARY ANGIOPLASTY WITH STENT PLACEMENT; Location: [facility]; Surgeon: Margie Lovelace, MD   CORONARY ANGIOPLASTY WITH STENT PLACEMENT Left 10/02/2011   Procedure: CORONARY ANGIOPLASTY WITH STENT PLACEMENT; Location: [facility]; Surgeon: Margie Lovelace, MD   CORONARY ANGIOPLASTY WITH STENT PLACEMENT Left 08/19/2012   Procedure: CORONARY ANGIOPLASTY WITH STENT PLACEMENT; Location: [facility]; Surgeon: Deatrice Darron, MD   ESOPHAGOGASTRODUODENOSCOPY (EGD) WITH PROPOFOL  N/A 09/21/2018   Procedure: ESOPHAGOGASTRODUODENOSCOPY (EGD) WITH PROPOFOL ;  Surgeon: Therisa Bi, MD;  Location: [facility] ENDOSCOPY;  Service: Gastroenterology;  Laterality: N/A;   ESOPHAGOGASTRODUODENOSCOPY (EGD) WITH PROPOFOL  N/A 10/06/2022   Procedure: ESOPHAGOGASTRODUODENOSCOPY (EGD) WITH PROPOFOL ;  Surgeon: Therisa Bi, MD;  Location: [facility] ENDOSCOPY;  Service: Gastroenterology;  Laterality: N/A;   EYE SURGERY     LEFT HEART CATH AND CORONARY ANGIOGRAPHY Left 08/02/2014  Procedure: LEFT HEART CATH AND CORONARY ANGIOGRAPHY; Location: [facility]; Surgeon: Alm Clay, MD   PERIPHERAL VASCULAR ATHERECTOMY Left 09/12/2020   Procedure: PERIPHERAL VASCULAR ATHERECTOMY;  Surgeon: Darron Deatrice LABOR, MD;  Location: [facility] INVASIVE CV LAB;  Service: Cardiovascular;  Laterality: Left;   PERIPHERAL VASCULAR BALLOON ANGIOPLASTY  09/12/2020   Procedure: PERIPHERAL VASCULAR BALLOON ANGIOPLASTY;  Surgeon: Darron Deatrice LABOR, MD;  Location: [facility] INVASIVE CV LAB;  Service: Cardiovascular;;   REVERSE SHOULDER ARTHROPLASTY Left 10/01/2021   Procedure: REVERSE SHOULDER ARTHROPLASTY WITH BICEPS TENODESIS.;  Surgeon: Edie Norleen PARAS, MD;  Location: [facility] ORS;  Service: Orthopedics;  Laterality: Left;   SHOULDER SURGERY Left 2017   THORACIC LAMINECTOMY FOR SPINAL CORD STIMULATOR N/A 10/27/2023   Procedure: THORACIC LAMINECTOMY FOR SPINAL CORD STIMULATOR PLACEMENT WITH INTERNAL PULSE GENERATOR IMPLANT;  Surgeon: Claudene Penne ORN, MD;  Location: [facility] ORS;   Service: Neurosurgery;  Laterality: N/A;  THORACIC LAMINECTOMY FOR SPINAL CORD STIMULATOR PLACEMENT WITH INTERNAL PULSE GENERATOR IMPLANT    Physical Exam   Triage Vital Signs: ED Triage Vitals  Encounter Vitals Group     BP 04/13/24 0944 (!) 172/100     Girls Systolic BP Percentile --      Girls Diastolic BP Percentile --      Boys Systolic BP Percentile --      Boys Diastolic BP Percentile --      Pulse Rate 04/13/24 0944 80     Resp 04/13/24 0944 20     Temp 04/13/24 0944 98.1 F (36.7 C)     Temp Source 04/13/24 0944 Oral     SpO2 04/13/24 0944 99 %     Weight 04/13/24 0946 125 lb (56.7 kg)     Height 04/13/24 0946 5' 6 (1.676 m)     Head Circumference --      Peak Flow --      Pain Score 04/13/24 0944 0     Pain Loc --      Pain Education --      Exclude from Growth Chart --     Most recent vital signs: Vitals:   04/13/24 1230 04/13/24 1300  BP: (!) 182/98 (!) 169/74  Pulse: (!) 59 61  Resp: 18   Temp:    SpO2: 96% 94%    General: Awake, no distress.  CV:  Good peripheral perfusion.  Regular rate rhythm Resp:  Normal effort.  Clear lungs Abd:  No distention.  Soft nontender Other:  Cranial nerves III through XII intact.  No acute neurodeficits.   ED Results / Procedures / Treatments   Labs (all labs ordered are listed, but only abnormal results are displayed) Labs Reviewed  COMPREHENSIVE METABOLIC PANEL WITH GFR - Abnormal; Notable for the following components:      Result Value   Potassium 3.4 (*)    Glucose, Bld 189 (*)    Creatinine, Ser 1.18 (*)    GFR, Estimated 48 (*)    All other components within normal limits  TROPONIN I (HIGH SENSITIVITY) - Abnormal; Notable for the following components:   Troponin I (High Sensitivity) 31 (*)    All other components within normal limits  TROPONIN I (HIGH SENSITIVITY) - Abnormal; Notable for the following components:   Troponin I (High Sensitivity) 31 (*)    All other components within normal limits  CBC  WITH DIFFERENTIAL/PLATELET     EKG     RADIOLOGY CT head interpreted by me, negative for intracranial hemorrhage.  Radiology report reviewed  PROCEDURES:  Procedures   MEDICATIONS ORDERED IN ED: Medications  hydrALAZINE  (APRESOLINE ) tablet 25 mg (25 mg Oral Given by Other 04/13/24 1032)  amLODipine  (NORVASC ) tablet 10 mg (10 mg Oral Given 04/13/24 1324)     IMPRESSION / MDM / ASSESSMENT AND PLAN / ED COURSE  I reviewed the triage vital signs and the nursing notes.  DDx: Intraparenchymal hemorrhage, AKI, electrolyte derangement, non-STEMI, symptomatic/uncontrolled hypertension, anxiety  Patient's presentation is most consistent with acute presentation with potential threat to life or bodily function.  Patient presents with nonspecific chest pain as well as headache in the setting of uncontrolled hypertension.  Better with oral hydralazine  in the ED, and symptoms improved.  Workup reassuring, serial troponins flat, other labs and CT head unremarkable.  She does request Xanax  for anxiety, I think Valium  could be a better choice.  She does note that her PCP was reluctant to prescribe this for her, I will provide a very limited course for now, counseled her that this is not a good long-term medication.  Will start Norvasc  for better blood pressure control, follow-up with PCP within the next week.       FINAL CLINICAL IMPRESSION(S) / ED DIAGNOSES   Final diagnoses:  Uncontrolled hypertension  Type 2 diabetes mellitus without complication, with long-term current use of insulin  (HCC)     Rx / DC Orders   ED Discharge Orders          Ordered    amLODipine  (NORVASC ) 10 MG tablet  Daily        04/13/24 1325    diazepam  (VALIUM ) 5 MG tablet  Every 12 hours PRN        04/13/24 1326             Note:  This document was prepared using Dragon voice recognition software and may include unintentional dictation errors.   Viviann Pastor, MD 04/13/24 1330

## 2024-04-15 ENCOUNTER — Ambulatory Visit: Payer: Self-pay

## 2024-04-15 NOTE — Telephone Encounter (Signed)
 Hosptial follow up scheduled with Dr. Watt on 04/18/2024 at 11:00 am

## 2024-04-15 NOTE — Telephone Encounter (Signed)
 Called and spoke with patient. Patient reports blood pressure 176/118 with heart rate of 77 about 30 minutes after taking medication. Patient current blood pressure 165/95 and heart rate 70. Patient scheduled to be seen in clinic 04/19/24. Recommended to patient to keep a log of blood pressures and bring to her appointment. Patient verbalizes understanding.

## 2024-04-15 NOTE — Telephone Encounter (Signed)
 FYI Only or Action Required?: FYI only for provider.  Patient was last seen in primary care on 03/24/2024 by Gretta Comer POUR, NP.  Called Nurse Triage reporting Hypertension.  Symptoms began several days ago.  Interventions attempted: Prescription medications: Amlodipine .  Symptoms are: gradually improving.  Triage Disposition: Urgent Home Treatment With Follow-up Call  Patient/caregiver understands and will follow disposition?: Yes Reason for Disposition  [1] Systolic BP >= 180 OR Diastolic >= 110 AND [2] missed most recent dose of blood pressure medication  Answer Assessment - Initial Assessment Questions 235/130 last Tuesday, called cardiologist Wednesday morning, went to ED. Was given Amlodipine  in ED for better blood pressure control, missed dose, not taking it at same time everyday. Educated patient on importance of taking BP every day, at the same time. Hosp f/u 9/29.  1. BLOOD PRESSURE: What is your blood pressure? Did you take at least two measurements 5 minutes apart?     176/118 30 mins ago  2. ONSET: When did you take your blood pressure?     Been going on for a week  3. HOW: How did you take your blood pressure? (e.g., automatic home BP monitor, visiting nurse)     Automatic bp monitor  4. HISTORY: Do you have a history of high blood pressure?     Dealing with high BP for awhile, hx of previous heart attack  5. MEDICINES: Are you taking any medicines for blood pressure? Have you missed any doses recently?     Started taking Amlodipine , unsure what else she takes   6. OTHER SYMPTOMS: Do you have any symptoms? (e.g., blurred vision, chest pain, difficulty breathing, headache, weakness)     SOB, headache (not new)  Protocols used: Blood Pressure - High-A-AH  Copied from CRM #[id number]. Topic: Clinical - Red Word Triage >> Apr 15, 2024  9:38 AM Harlene ORN wrote: Red Word that prompted transfer to Nurse Triage: high bp  patient's latest BP reading  taken 04/15/2024: 176 over 118

## 2024-04-17 NOTE — Progress Notes (Signed)
 Sarah Payne T. Alezander Dimaano, MD, CAQ Sports Medicine [facility] [address] [place], [address]  Phone: [phone]  FAX: [phone]  Sarah Payne - 78 y.o. female  MRN [id number]  Date of Birth: [birth year]  Date: 04/18/2024  PCP: Gretta Comer MARLA, NP  Referral: Gretta Comer MARLA, NP  No chief complaint on file.  Subjective:   Sarah Payne is a 78 y.o. very pleasant female patient with There is no height or weight on file to calculate BMI. who presents with the following:  Discussed the use of AI scribe software for clinical note transcription with the patient, who gave verbal consent to proceed.  Patient presents for hospital follow-up for hypertension.  She also has a history of diabetes, smoking, stage IIIb chronic kidney disease, COPD, CHF, and Takotsubo cardiomyopathy.  In the emergency room, they started amlodipine  10 mg.  Blood pressure was as high as 220/110 at home. - She normally takes Coreg  3.125 mg p.o. twice daily, Imdur  30 mg.  Lasix  20 mg.  She was also having a lot of psychosocial stress, and they gave her some Valium . History of Present Illness     Review of Systems is noted in the HPI, as appropriate  Objective:   There were no vitals taken for this visit.  GEN: No acute distress; alert,appropriate. PULM: Breathing comfortably in no respiratory distress PSYCH: Normally interactive.   Laboratory and Imaging Data:  Assessment and Plan:   No diagnosis found. Assessment & Plan   Medication Management during today's office visit: No orders of the defined types were placed in this encounter.  There are no discontinued medications.  Orders placed today for conditions managed today: No orders of the defined types were placed in this encounter.   Disposition: No follow-ups on file.  Dragon [employer] One speech-to-text software was used for transcription in this dictation.  Possible transcriptional errors  can occur using Animal nutritionist.   Signed,  Jacques DASEN. Zadiel Leyh, MD   Outpatient Encounter Medications as of 04/18/2024  Medication Sig   acetaminophen  (TYLENOL ) 650 MG CR tablet Take 1,300 mg by mouth 2 (two) times daily.   amLODipine  (NORVASC ) 10 MG tablet Take 1 tablet (10 mg total) by mouth daily.   atorvastatin  (LIPITOR ) 80 MG tablet Take 1 tablet (80 mg total) by mouth daily. for cholesterol.   carvedilol  (COREG ) 3.125 MG tablet Take 1 tablet (3.125 mg total) by mouth 2 (two) times daily.   clobetasol  cream (TEMOVATE ) 0.05 % Apply 1 Application topically 2 (two) times daily as needed. For vaginal itching.   clopidogrel  (PLAVIX ) 75 MG tablet Take 1 tablet (75 mg total) by mouth daily.   Continuous Glucose Receiver (FREESTYLE LIBRE 2 READER) DEVI Use with sensor to check blood sugars 6 times daily.   Continuous Glucose Sensor (FREESTYLE LIBRE 2 SENSOR) MISC USE TO CHECK BLOOD SUGAR CHANGE EVERY 14 DAYS   diazepam  (VALIUM ) 5 MG tablet Take 1 tablet (5 mg total) by mouth every 12 (twelve) hours as needed for muscle spasms.   docusate sodium  (COLACE) 100 MG capsule Take 1 capsule (100 mg total) by mouth 2 (two) times daily.   Evolocumab  (REPATHA  SURECLICK) 140 MG/ML SOAJ Inject 140 mg into the skin every 14 (fourteen) days.   ezetimibe  (ZETIA ) 10 MG tablet Take 1 tablet (10 mg total) by mouth daily. For cholesterol.   furosemide  (LASIX ) 20 MG tablet Take 1 tablet (20 mg total) by mouth as needed (As needed  for weight gain greater than 3 pounds overnight or increased shortness of breath.). Take one tab only on Monday, Wednesday, & Fridays   gabapentin  (NEURONTIN ) 300 MG capsule TAKE 1 CAPSULE BY MOUTH TWICE DAILY FOR PAIN   Glucagon  (GVOKE HYPOPEN  2-PACK) 1 MG/0.2ML SOAJ Inject 1 mg (1 pen) as needed for severe low blood sugar (sustained glucose less than 55 despite oral glucose treatments). May repeat in 15 minutes as needed.   insulin  aspart (NOVOLOG  FLEXPEN) 100 UNIT/ML FlexPen INJECT 6-16  UNITS SUBCUTANEOUSLY THREE TIMES DAILY WITH MEALS PER  SLIDING  SCALE  FOR  DIABETES   insulin  degludec (TRESIBA  FLEXTOUCH) 100 UNIT/ML FlexTouch Pen Inject 5 Units into the skin daily. for diabetes. (Patient taking differently: Inject 10 Units into the skin daily. for diabetes.)   Insulin  Pen Needle (INSUPEN PEN NEEDLES) 32G X 4 MM MISC Use to inject insulin  4 times daily.   isosorbide  mononitrate (IMDUR ) 30 MG 24 hr tablet Take 30 mg by mouth daily.   levothyroxine  (SYNTHROID ) 100 MCG tablet TAKE 1 TABLET BY MOUTH IN THE MORNING ON  AN  EMPTY  STOMACH  WITH  WATER   ONLY.  NO  FOOD  OR  OTHER  MEDICATIONS  FOR  30  MINUTES   mirtazapine  (REMERON ) 15 MG tablet Take 1 tablet (15 mg total) by mouth at bedtime. For sleep and depression (Patient taking differently: Take 15 mg by mouth at bedtime as needed (sleep).)   ondansetron  (ZOFRAN ) 4 MG tablet Take 1 tablet (4 mg total) by mouth every 6 (six) hours as needed for nausea.   potassium chloride  (KLOR-CON  M) 10 MEQ tablet Only take on days you take your lasix . Take 1 tablet (10 mEq total) by mouth for 1 dose on those days you take your lasix  20mg  pill. (Patient taking differently: Take 10 mEq by mouth every Monday, Wednesday, and Friday.)   senna (SENOKOT) 8.6 MG TABS tablet Take 1 tablet (8.6 mg total) by mouth daily.   sitaGLIPtin  (JANUVIA ) 100 MG tablet Take 1 tablet (100 mg total) by mouth daily. for diabetes.   No facility-administered encounter medications on file as of 04/18/2024.

## 2024-04-18 ENCOUNTER — Encounter: Payer: Self-pay | Admitting: Family Medicine

## 2024-04-18 ENCOUNTER — Ambulatory Visit: Admitting: Family Medicine

## 2024-04-18 VITALS — BP 170/88 | HR 72 | Temp 98.2°F | Ht 66.0 in | Wt 128.1 lb

## 2024-04-18 DIAGNOSIS — I5022 Chronic systolic (congestive) heart failure: Secondary | ICD-10-CM

## 2024-04-18 DIAGNOSIS — I5181 Takotsubo syndrome: Secondary | ICD-10-CM | POA: Diagnosis not present

## 2024-04-18 DIAGNOSIS — I10 Essential (primary) hypertension: Secondary | ICD-10-CM

## 2024-04-18 DIAGNOSIS — J431 Panlobular emphysema: Secondary | ICD-10-CM

## 2024-04-19 ENCOUNTER — Ambulatory Visit: Attending: Cardiology | Admitting: Physician Assistant

## 2024-04-19 ENCOUNTER — Encounter: Payer: Self-pay | Admitting: Cardiology

## 2024-04-19 VITALS — BP 166/78 | HR 76 | Ht 66.0 in | Wt 129.6 lb

## 2024-04-19 DIAGNOSIS — I779 Disorder of arteries and arterioles, unspecified: Secondary | ICD-10-CM

## 2024-04-19 DIAGNOSIS — I2510 Atherosclerotic heart disease of native coronary artery without angina pectoris: Secondary | ICD-10-CM | POA: Diagnosis not present

## 2024-04-19 DIAGNOSIS — E785 Hyperlipidemia, unspecified: Secondary | ICD-10-CM

## 2024-04-19 DIAGNOSIS — I5032 Chronic diastolic (congestive) heart failure: Secondary | ICD-10-CM

## 2024-04-19 DIAGNOSIS — Z720 Tobacco use: Secondary | ICD-10-CM

## 2024-04-19 DIAGNOSIS — R0609 Other forms of dyspnea: Secondary | ICD-10-CM

## 2024-04-19 DIAGNOSIS — I739 Peripheral vascular disease, unspecified: Secondary | ICD-10-CM

## 2024-04-19 DIAGNOSIS — I447 Left bundle-branch block, unspecified: Secondary | ICD-10-CM

## 2024-04-19 DIAGNOSIS — I10 Essential (primary) hypertension: Secondary | ICD-10-CM | POA: Diagnosis not present

## 2024-04-19 DIAGNOSIS — I5181 Takotsubo syndrome: Secondary | ICD-10-CM

## 2024-04-19 DIAGNOSIS — I5020 Unspecified systolic (congestive) heart failure: Secondary | ICD-10-CM

## 2024-04-19 MED ORDER — CARVEDILOL 12.5 MG PO TABS: 12.5000 mg | ORAL_TABLET | Freq: Two times a day (BID) | ORAL | 3 refills | Status: DC

## 2024-04-19 NOTE — Patient Instructions (Signed)
 Medication Instructions:  Your physician recommends the following medication changes.  INCREASE: Carvedilol  12.5 mg two times daily  *If you need a refill on your cardiac medications before your next appointment, please call your pharmacy*  Lab Work: No labs ordered today  If you have labs (blood work) drawn today and your tests are completely normal, you will receive your results only by: MyChart Message (if you have MyChart) OR A paper copy in the mail If you have any lab test that is abnormal or we need to change your treatment, we will call you to review the results.  Testing/Procedures: Your physician has requested that you have an echocardiogram. Echocardiography is a painless test that uses sound waves to create images of your heart. It provides your doctor with information about the size and shape of your heart and how well your heart's chambers and valves are working.   You may receive an ultrasound enhancing agent through an IV if needed to better visualize your heart during the echo. This procedure takes approximately one hour.  There are no restrictions for this procedure.  This will take place at [address] [facility]) [address], [place] [address]  Please note: We ask at that you not bring children with you during ultrasound (echo/ vascular) testing. Due to room size and safety concerns, children are not allowed in the ultrasound rooms during exams. Our front office staff cannot provide observation of children in our lobby area while testing is being conducted. An adult accompanying a patient to their appointment will only be allowed in the ultrasound room at the discretion of the ultrasound technician under special circumstances. We apologize for any inconvenience.   Follow-Up: At [facility], you and your health needs are our priority.  As part of our continuing mission to provide you with exceptional heart care, our providers are all part of one  team.  This team includes your primary Cardiologist (physician) and Advanced Practice Providers or APPs (Physician Assistants and Nurse Practitioners) who all work together to provide you with the care you need, when you need it.  Your next appointment:   2 week(s)  Provider:   You may see Timothy Gollan, MD or one of the following Advanced Practice Providers on your designated Care Team:   Lonni Meager, NP Lesley Maffucci, PA-C Bernardino Bring, PA-C Cadence Brazoria, PA-C Tylene Lunch, NP Barnie Hila, NP    We recommend signing up for the patient portal called MyChart.  Sign up information is provided on this After Visit Summary.  MyChart is used to connect with patients for Virtual Visits (Telemedicine).  Patients are able to view lab/test results, encounter notes, upcoming appointments, etc.  Non-urgent messages can be sent to your provider as well.   To learn more about what you can do with MyChart, go to [email or web address].

## 2024-04-19 NOTE — Progress Notes (Signed)
 Cardiology Office Note    Date:  04/19/2024   ID:  Taylan, Mayhan 1945-08-09, MRN [id number]  PCP:  Gretta Comer MARLA, NP  Cardiologist:  Evalene Lunger, MD  Electrophysiologist:  None   Chief Complaint: ED follow up  History of Present Illness:   TORIN WHISNER is a 78 y.o. female with history of CAD s/p PCI/DES to the distal RCA in 08/2011, mid LCx in 09/2011, and PCI/DES x 2 to the RCA in 07/2012, HFimpEF, PAD s/p orbital arthrectomy and drug-coated balloon angioplasty to the left SFA in 08/2020, type 2 diabetes, CKD stage IIIb, intermittent LBBB, hypertension, hyperlipidemia, COPD, hypothyroidism, orthostatic dizziness versus vertigo, and sleep apnea who presents for ED follow up.    LHC in 07/2012 showed moderate mid LAD disease at the takeoff of the diagonal vessel, severe ostial to mid RCA disease, and an EF of 55% with FFR indicating severe disease involving the RCA which was treated successfully with PCI/DES x 2.  Patient was admitted 12/2015 with stress induced cardiomyopathy.  Echo at that time showed EF 25 to 30% with wall motion abnormality concerning for Takotsubo, G1 DD.  LHC 12/2015 showed patent stents.  Follow-up echo in 03/2016 showed normalization of LV systolic function with EF 60 to 65%, normal wall motion, and G1 DD.  Cardiac monitoring in 03/2021 showed predominant sinus rhythm with average heart rate of 80 bpm, 3 episodes of SVT with the longest lasting 7 beats, and rare atrial and ventricular ectopy.  No significant arrhythmias to explain symptoms of syncope.  Echo 03/2021 demonstrated EF of 55%, no RWMA, mild LVH, and G1 DD.  Carotid artery ultrasound 03/2021 demonstrated 1 to 39% bilateral ICA stenoses with antegrade flow the 5 bilateral vertebral arteries and normal flow hemodynamics in the bilateral subclavian arteries.  Patient underwent shoulder arthroplasty 09/2021 with postoperative course complicated by acute onset shortness of breath and lethargy.  She was noted to  be hyperkalemic with potassium of 5.9 which improved with treatment.  Chest x-ray showed mild pulmonary edema mild bilateral pleural effusions with improvement with diuresis.  Echo 09/2021 showed EF 55 to 60%, wall motion abnormality possible secondary to LBBB, G1 DD.  Spironolactone  was held due to hyperkalemia.  Imdur  and meclizine  were also discontinued at discharge.  Seen in office follow-up 08/2022 and noted recent stress due to conflict with daughter.  Given history of Takotsubo in the setting of acute family stressors in 2017, repeat echo was ordered to evaluate for recurrent stress-induced cardiomyopathy.  Echo completed 09/2022 showed EF 55 to 60% with G1 DD, mild MR moderate LVH and wall motion abnormality consistent with LBBB.  Carotid ultrasound, left lower extremity arterial duplex, and ABIs were ordered and stable when compared to prior.   Patient was most recently seen in office 08/2023 overall doing well from a cardiac perspective.  She was seen for preoperative cardiovascular evaluation for spinal cord stimulator and deemed to be acceptable risk for procedure without further testing.  She continued to have significant stress at home.  Patient had recent ED visit for elevated blood pressure with readings up to 220/110 at home associated with generalized headache x 1 week.  She also reported intermittent fleeting episodes of chest discomfort, not related to exertion.  Patient reported increased stress recently due to conflict with her adult sons.  Reported compliance with her antihypertensive medications.  BP 172/100 in the ED with otherwise normal vital signs.  Labs overall reassuring.  Troponin minimally elevated and flat  trending.  She was given hydralazine  in the ED with relief of symptoms.  She was started on amlodipine  10 mg daily on discharge in addition to her PTA antihypertensive regimen.  She was also provided Valium  for anxiety.  She was seen in follow-up by her PCP yesterday 9/29 with BP  170/88.  Additional titration of antihypertensives were deferred.  Patient presents today with ongoing concern for elevated blood pressure for the past week. She reports improvement in home readings since starting amlodipine  without improvement in symptoms. Her BP at home continues to run in the 160s-180s mmHg. She has associated headache and fatigue. She also reports worsening dyspnea on exertion since the elevated blood pressure started. She reports one fleeting episode of chest discomfort which was not associated with exertion and resolved spontaneously after a few minutes. She is without symptoms of angina and cardiac decompensation. She denies lightheadedness, dizziness, lower extremity swelling, and orthopnea. Denies bleeding and hematochezia. Reports ongoing life stressors due to conflict with her adult sons.   Labs independently reviewed: 03/2024-sodium 137, potassium 3.4, BUN 17, creatinine 1.18, normal LFTs, Hgb 13.2, HCT 40.5, platelets 241, TSH wnl, A1c 9.2, TC 182, TG 75, HDL 43, LDL 44  Objective   Past Medical History:  Diagnosis Date   Acute pain of left shoulder 01/17/2019   Aortic atherosclerosis    CHF (congestive heart failure) (HCC) 09/16/2013   a.) TTE 09/16/2013: EF 55-60%; mod inferior HK, triv TR; G1DD. b.) TTE 01/02/2016: EF 25-30%; mid-apicalateroseptal, lat, inf, inferoseptal, apical akinesis; triv TR; G1DD. c. TTE 04/16/2016: EF 65%; G1DD. d.) TTE 05/02/2020: EF 50-55%; G2DD. e.) TTE 04/11/2021: EF 55%; G1DD.   Chronic low back pain with bilateral sciatica    Chronic painful diabetic neuropathy (HCC)    Chronic radicular lumbar pain    CKD (chronic kidney disease), stage III (HCC)    Clostridioides difficile infection 2015   COPD (chronic obstructive pulmonary disease) (HCC)    Coronary artery disease    a.) PCI 02/06/2011: 100% mLCx - DES x 3. b.) PCI 09/17/2011: 95% dRCA - DES x 1. c.) PCI 10/02/2011: 75% pLCX - DES x 1. d.) PCI 08/19/2012: DES x 2 p-mRCA.  e.) LHC  01/01/16: Takotsubo event 01/01/2016 with patent stents   Depression    Diverticulosis    Failed back syndrome    Frequent PVCs    a. Noted in hospital 12/2015.   GERD (gastroesophageal reflux disease)    History of bilateral cataract extraction 01/2015   History of heart artery stent    a.) TOTAL of 7 stents --> 02/06/2011 - overlapping 2.5 x 12 mm Xience V, 2.5 x 8 mm Xience V, 2.25 x 8 mm Xience Nano to mLCx; 09/17/2011 - 2.5 x 23 Xience V dRCA; 10/02/2011 - 2.5 x 15 mm Xience V pLCx; 08/09/2012 - overlapping 3.0 x 38mm mRCA and 3.5 x 23mm pRCA   Hyperlipidemia    Hypertension    Hypothyroidism    Impingement syndrome of left shoulder    LBBB (left bundle branch block)    Long term current use of clopidogrel     Marijuana abuse    Myocardial infarction [facility])    a.) multiple MIs;  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 12/2015 - nephew committed suicide 1 week prior, sister died the morning of presentation - initially called a STEMI; cath with patent stents. LVEF 25-30%.   Tendonitis of left rotator cuff    Tobacco abuse    Vaginal burning 03/22/2019   Vascular dementia     Current Medications: Current Meds  Medication Sig   acetaminophen  (TYLENOL ) 650 MG CR tablet Take 1,300 mg by mouth 2 (two) times daily.   amLODipine  (NORVASC ) 10 MG tablet Take 1 tablet (10 mg total) by mouth daily.   atorvastatin  (LIPITOR ) 80 MG tablet Take 1 tablet (80 mg total) by mouth daily. for cholesterol.   clobetasol  cream (TEMOVATE ) 0.05 % Apply 1 Application topically 2 (two)  times daily as needed. For vaginal itching.   clopidogrel  (PLAVIX ) 75 MG tablet Take 1 tablet (75 mg total) by mouth daily.   Continuous Glucose Receiver (FREESTYLE LIBRE 2 READER) DEVI Use with sensor to check blood sugars 6 times daily.   Continuous Glucose Sensor (FREESTYLE LIBRE 2 SENSOR) MISC USE TO CHECK BLOOD SUGAR CHANGE EVERY 14 DAYS   diazepam  (VALIUM ) 5 MG tablet Take 1 tablet (5 mg total) by mouth every 12 (twelve) hours as needed for muscle spasms.   docusate sodium  (COLACE) 100 MG capsule Take 1 capsule (100 mg total) by mouth 2 (two) times daily.   Evolocumab  (REPATHA  SURECLICK) 140 MG/ML SOAJ Inject 140 mg into the skin every 14 (fourteen) days.   ezetimibe  (ZETIA ) 10 MG tablet Take 1 tablet (10 mg total) by mouth daily. For cholesterol.   furosemide  (LASIX ) 20 MG tablet Take 1 tablet (20 mg total) by mouth as needed (As needed for weight gain greater than 3 pounds overnight or increased shortness of breath.). Take one tab only on Monday, Wednesday, & Fridays   gabapentin  (NEURONTIN ) 300 MG capsule TAKE 1 CAPSULE BY MOUTH TWICE DAILY FOR PAIN   Glucagon  (GVOKE HYPOPEN  2-PACK) 1 MG/0.2ML SOAJ Inject 1 mg (1 pen) as needed for severe low blood sugar (sustained glucose less than 55 despite oral glucose treatments). May repeat in 15 minutes as needed.   insulin  aspart (NOVOLOG  FLEXPEN) 100 UNIT/ML FlexPen INJECT 6-16 UNITS SUBCUTANEOUSLY THREE TIMES DAILY WITH MEALS PER  SLIDING  SCALE  FOR  DIABETES   insulin  degludec (TRESIBA  FLEXTOUCH) 100 UNIT/ML FlexTouch Pen Inject 10 Units into the skin daily.   Insulin  Pen Needle (INSUPEN PEN NEEDLES) 32G X 4 MM MISC Use to inject insulin  4 times daily.   isosorbide  mononitrate (IMDUR ) 30 MG 24 hr tablet Take 30 mg by mouth daily.   levothyroxine  (SYNTHROID ) 100 MCG tablet TAKE 1 TABLET BY MOUTH IN THE MORNING ON  AN  EMPTY  STOMACH  WITH  WATER   ONLY.  NO  FOOD  OR  OTHER  MEDICATIONS  FOR  30  MINUTES   mirtazapine  (REMERON ) 15 MG tablet Take 15  mg by mouth at bedtime as needed.   ondansetron  (ZOFRAN ) 4 MG tablet Take 1 tablet (4 mg total) by mouth every 6 (six) hours as needed for nausea.   potassium chloride  (KLOR-CON  M) 10 MEQ tablet Take 10 mEq by mouth every Monday, Wednesday, and Friday.   senna (SENOKOT) 8.6 MG TABS tablet Take 1 tablet (  8.6 mg total) by mouth daily.   sitaGLIPtin  (JANUVIA ) 100 MG tablet Take 1 tablet (100 mg total) by mouth daily. for diabetes.   [DISCONTINUED] carvedilol  (COREG ) 3.125 MG tablet Take 1 tablet (3.125 mg total) by mouth 2 (two) times daily.    Allergies:   Patient has no known allergies.   Social History   Socioeconomic History   Marital status: Married    Spouse name: Laurier   Number of children: Not on file   Years of education: Not on file   Highest education level: Not on file  Occupational History   Not on file  Tobacco Use   Smoking status: Every Day    Current packs/day: 1.00    Average packs/day: 1 pack/day for 45.0 years (45.0 ttl pk-yrs)    Types: Cigarettes   Smokeless tobacco: Never   Tobacco comments:    Has cut back, trying to quit.   Vaping Use   Vaping status: Some Days   Substances: Nicotine   Substance and Sexual Activity   Alcohol use: No    Alcohol/week: 0.0 standard drinks of alcohol   Drug use: Yes    Types: Marijuana    Comment: last noc   Sexual activity: Not on file  Other Topics Concern   Not on file  Social History Narrative   Lives at home with her husband in [place].  Previously used marijuana - quit.      Regular exercise: no/ pain from a frozen rotator cuff   Caffeine use: coffee daily and pepsi      Does not have a living will.   Daughters and husband know her wishes- would desire CPR but not prolonged life support if futile   Social Drivers of Corporate investment banker Strain: Low Risk  (10/22/2023)   Overall Financial Resource Strain (CARDIA)    Difficulty of Paying Living Expenses: Not hard at all  Food Insecurity: No Food  Insecurity (01/27/2024)   Hunger Vital Sign    Worried About Running Out of Food in the Last Year: Never true    Ran Out of Food in the Last Year: Never true  Transportation Needs: No Transportation Needs (01/27/2024)   PRAPARE - Administrator, Civil Service (Medical): No    Lack of Transportation (Non-Medical): No  Physical Activity: Insufficiently Active (10/22/2023)   Exercise Vital Sign    Days of Exercise per Week: 3 days    Minutes of Exercise per Session: 20 min  Stress: No Stress Concern Present (10/22/2023)   [employer] of Occupational Health - Occupational Stress Questionnaire    Feeling of Stress : Only a little  Social Connections: Moderately Isolated (10/22/2023)   Social Connection and Isolation Panel    Frequency of Communication with Friends and Family: Twice a week    Frequency of Social Gatherings with Friends and Family: Once a week    Attends Religious Services: Never    Database administrator or Organizations: No    Attends Engineer, structural: Never    Marital Status: Married     Family History:  The patient's family history includes Colon cancer in her sister; Heart attack in her mother; Heart disease in her father and mother; Liver cancer in her brother; Throat cancer in her brother.  ROS:   12-point review of systems is negative unless otherwise noted in the HPI.  EKGs/Other Studies Reviewed:    Studies reviewed were summarized above. The additional studies were reviewed today:  09/2022 Carotid doppler 1 to 39% narrowing of the bilateral internal carotid arteries   09/2022 LLE arterial doppler Left: No significant change as compared to previous study. Patent  PTA/Atherectomy site without stenosis.   09/2022 ABI Right: Resting right ankle-brachial index is within normal range. The right toe-brachial index is abnormal.   Left: Resting left ankle-brachial index is within normal range. The left toe-brachial index is abnormal.    09/2022 Echo complete 1. Left ventricular ejection fraction, by estimation, is 55 to 60%. Left  ventricular ejection fraction by PLAX is 54 %. The left ventricle has  normal function. Septal wall dyskinesis secondary to bundle branch block.  There is moderate left ventricular  hypertrophy. Left ventricular diastolic parameters are consistent with  Grade I diastolic dysfunction (impaired relaxation). The average left  ventricular global longitudinal strain is -9.5 %.   2. Right ventricular systolic function is normal. The right ventricular  size is normal. Tricuspid regurgitation signal is inadequate for assessing  PA pressure.   3. The mitral valve is normal in structure. Mild mitral valve  regurgitation. No evidence of mitral stenosis.   4. The aortic valve has an indeterminant number of cusps. Aortic valve  regurgitation is not visualized. Aortic valve sclerosis/calcification is  present, without any evidence of aortic stenosis.   5. The inferior vena cava is normal in size with greater than 50%  respiratory variability, suggesting right atrial pressure of 3 mmHg.    EKG:  EKG personally reviewed by me today EKG Interpretation Date/Time:  Tuesday April 19 2024 10:56:17 EDT Ventricular Rate:  76 PR Interval:  136 QRS Duration:  124 QT Interval:  434 QTC Calculation: 488 R Axis:   6  Text Interpretation: Sinus rhythm with multifocal PVCs and PACs Left bundle branch block When compared with ECG of 11-Sep-2023 10:01, Current undetermined rhythm precludes rhythm comparison, needs review Left bundle branch block is now Present Borderline criteria for Anterior infarct are no longer Present Confirmed by Lorene Sinclair ([id number]) on 04/19/2024 11:02:14 AM  PHYSICAL EXAM:    VS:  BP (!) 166/78 (BP Location: Left Arm, Patient Position: Sitting, Cuff Size: Normal)   Pulse 76   Ht 5' 6 (1.676 m)   Wt 129 lb 9.6 oz (58.8 kg)   SpO2 98%   BMI 20.92 kg/m   BMI: Body mass index is 20.92  kg/m.  GEN: Well nourished, well developed in no acute distress NECK: No JVD; No carotid bruits CARDIAC: RRR, no murmurs, rubs, gallops RESPIRATORY:  Clear to auscultation without rales, wheezing or rhonchi  ABDOMEN: Soft, non-tender, non-distended EXTREMITIES: No edema; No deformity  Wt Readings from Last 3 Encounters:  04/19/24 129 lb 9.6 oz (58.8 kg)  04/18/24 128 lb 2 oz (58.1 kg)  04/13/24 125 lb (56.7 kg)                  ASSESSMENT & PLAN:   Hypertension - Recent ED visit for hypertensive urgency with BP up to 220/110 at home. K was 3.4 with otherwise reassuring lab work. Troponin minimally elevated and flat trending. Patient continues to have headache, fatigue, and DOE. BP at home has been trending 160s-180s mmHg systolic. Increase carvedilol  to 12.5 mg twice daily. Continue amlodipine  10 mg daily, lasix  10 mg three times per week, and Imdur  30 mg daily. Recommend continuing to log BP ~30 minutes after taking medication. If headache persists despite BP control, may need to consider discontinuing Imdur . If BP remains elevated, consider addition of ARB.  Coronary artery disease - No symptoms concerning for angina and cardiac decompensation. Continue clopidogrel , ezetimibe , rosuvastatin , and Repatha . Increase carvedilol  as above.   HfimpEF Hx stress induced cardiomyopathy - Appears euvolemic on exam. She has a history of Takotsubo cardiomyomathy in the setting of significant life stressors in 2017. Given recent increase in life stressors recently and worsening dyspnea on exertion, recommend updating echo. Previously did not tolerate MRA due to hyperkalemia. She is continued on lasix  20 mg three times per week. Consider ARB at follow up as above.   Hypokalemia - Noted in ED visit. She is on supplemental potassium 10 mEq three time weekly. Anticipate updating BMP and checking mag at follow up.   Hyperlipidemia - Most recent lipid panel 03/2024 with LDL 44, at goal. She is  continued on statin, ezetimibe , and Repatha .   PAD - S/p intervention to the left FSA in 08/2020.  Most recent ABI and LAA 09/2022 were stable.  She is overdue for follow-up imaging.  Anticipate ordering at follow-up.  She is continued on clopidogrel , ezetimibe , rosuvastatin , and Repatha .  Intermittent LBBB - Noted on EKG today.  Update echo as above.  Carotid artery stenosis - Carotid artery ultrasound 09/2022 showed 1 to 39% narrowing of the bilateral internal carotid arteries.  Tobacco use - Ongoing use although she reports cutting back significantly from previous.  Recommend complete cessation.     Disposition: Increase carvedilol  to 12.5 mg twice daily.  Continue logging blood pressure.  Update echo.  F/u with Dr. Gollan or an APP in 1-2 weeks.   Medication Adjustments/Labs and Tests Ordered: Current medicines are reviewed at length with the patient today.  Concerns regarding medicines are outlined above. Medication changes, Labs and Tests ordered today are summarized above and listed in the Patient Instructions accessible in Encounters.   Bonney Lesley Maffucci, PA-C 04/19/2024 12:36 PM     [facility] [place] [address] [place], [place] [address] [phone]

## 2024-04-20 ENCOUNTER — Telehealth: Payer: Self-pay

## 2024-04-20 DIAGNOSIS — E039 Hypothyroidism, unspecified: Secondary | ICD-10-CM

## 2024-04-21 ENCOUNTER — Ambulatory Visit

## 2024-04-21 MED ORDER — LEVOTHYROXINE SODIUM 100 MCG PO TABS: ORAL_TABLET | ORAL | 2 refills | Status: AC

## 2024-04-21 NOTE — Telephone Encounter (Signed)
 Refills sent to pharmacy.

## 2024-04-26 ENCOUNTER — Telehealth: Payer: Self-pay | Admitting: Cardiovascular Disease

## 2024-04-26 ENCOUNTER — Emergency Department
Admission: EM | Admit: 2024-04-26 | Discharge: 2024-04-26 | Disposition: A | Discharge status: Home / Self Care | Attending: Emergency Medicine | Admitting: Emergency Medicine

## 2024-04-26 ENCOUNTER — Emergency Department

## 2024-04-26 ENCOUNTER — Other Ambulatory Visit: Payer: Self-pay

## 2024-04-26 ENCOUNTER — Encounter: Payer: Self-pay | Admitting: *Deleted

## 2024-04-26 DIAGNOSIS — E1122 Type 2 diabetes mellitus with diabetic chronic kidney disease: Secondary | ICD-10-CM | POA: Insufficient documentation

## 2024-04-26 DIAGNOSIS — I2510 Atherosclerotic heart disease of native coronary artery without angina pectoris: Secondary | ICD-10-CM | POA: Insufficient documentation

## 2024-04-26 DIAGNOSIS — J449 Chronic obstructive pulmonary disease, unspecified: Secondary | ICD-10-CM | POA: Diagnosis not present

## 2024-04-26 DIAGNOSIS — N189 Chronic kidney disease, unspecified: Secondary | ICD-10-CM | POA: Insufficient documentation

## 2024-04-26 DIAGNOSIS — I259 Chronic ischemic heart disease, unspecified: Secondary | ICD-10-CM | POA: Diagnosis not present

## 2024-04-26 DIAGNOSIS — Z96611 Presence of right artificial shoulder joint: Secondary | ICD-10-CM | POA: Diagnosis not present

## 2024-04-26 DIAGNOSIS — R600 Localized edema: Secondary | ICD-10-CM | POA: Diagnosis not present

## 2024-04-26 DIAGNOSIS — I10 Essential (primary) hypertension: Secondary | ICD-10-CM | POA: Diagnosis not present

## 2024-04-26 DIAGNOSIS — M25473 Effusion, unspecified ankle: Secondary | ICD-10-CM

## 2024-04-26 DIAGNOSIS — R519 Headache, unspecified: Secondary | ICD-10-CM | POA: Diagnosis not present

## 2024-04-26 DIAGNOSIS — R2681 Unsteadiness on feet: Secondary | ICD-10-CM | POA: Diagnosis not present

## 2024-04-26 DIAGNOSIS — R0602 Shortness of breath: Secondary | ICD-10-CM | POA: Diagnosis not present

## 2024-04-26 LAB — CBC
HCT: 36.4 % (ref 36.0–46.0)
Hemoglobin: 12 g/dL (ref 12.0–15.0)
MCH: 30.7 pg (ref 26.0–34.0)
MCHC: 33 g/dL (ref 30.0–36.0)
MCV: 93.1 fL (ref 80.0–100.0)
Platelets: 253 K/uL (ref 150–400)
RBC: 3.91 MIL/uL (ref 3.87–5.11)
RDW: 13.7 % (ref 11.5–15.5)
WBC: 8.2 K/uL (ref 4.0–10.5)
nRBC: 0 % (ref 0.0–0.2)

## 2024-04-26 LAB — BASIC METABOLIC PANEL WITH GFR
Anion gap: 12 (ref 5–15)
BUN: 18 mg/dL (ref 8–23)
CO2: 24 mmol/L (ref 22–32)
Calcium: 8.5 mg/dL — ABNORMAL LOW (ref 8.9–10.3)
Chloride: 101 mmol/L (ref 98–111)
Creatinine, Ser: 1.45 mg/dL — ABNORMAL HIGH (ref 0.44–1.00)
GFR, Estimated: 37 mL/min — ABNORMAL LOW (ref >60)
Glucose, Bld: 192 mg/dL — ABNORMAL HIGH (ref 70–99)
Potassium: 3.4 mmol/L — ABNORMAL LOW (ref 3.5–5.1)
Sodium: 137 mmol/L (ref 135–145)

## 2024-04-26 LAB — TROPONIN I (HIGH SENSITIVITY)
Troponin I (High Sensitivity): 29 ng/L — ABNORMAL HIGH (ref <18)
Troponin I (High Sensitivity): 31 ng/L — ABNORMAL HIGH (ref <18)

## 2024-04-26 LAB — PROTIME-INR
INR: 0.9 (ref 0.8–1.2)
Prothrombin Time: 12.4 s (ref 11.4–15.2)

## 2024-04-26 LAB — BRAIN NATRIURETIC PEPTIDE: B Natriuretic Peptide: 262.5 pg/mL — ABNORMAL HIGH (ref 0.0–100.0)

## 2024-04-26 MED ORDER — SODIUM CHLORIDE 0.9 % IV BOLUS
500.0000 mL | Freq: Once | INTRAVENOUS | Status: AC
  Administered 2024-04-26: 500 mL via INTRAVENOUS

## 2024-04-26 MED ORDER — ACETAMINOPHEN 500 MG PO TABS
1000.0000 mg | ORAL_TABLET | Freq: Once | ORAL | Status: AC
  Administered 2024-04-26: 1000 mg via ORAL
  Filled 2024-04-26: qty 2

## 2024-04-26 MED ORDER — METOCLOPRAMIDE HCL 5 MG/ML IJ SOLN
10.0000 mg | Freq: Once | INTRAMUSCULAR | Status: AC
  Administered 2024-04-26: 10 mg via INTRAVENOUS
  Filled 2024-04-26: qty 2

## 2024-04-26 NOTE — ED Triage Notes (Addendum)
 Pt to triage via wheelchair.  Pt has htn for 3 weeks.  Pt has redness to right eye.  Pt reports headaches for 3 weeks.  No chest pain  hx cardiac stents.  Pt reports intermittent sob.  Pt alert  speech clear.  Pt was seen here last week with similar sx in the ER.

## 2024-04-26 NOTE — Telephone Encounter (Signed)
 Called patient back with more recommendations. Patient to report to ED for further evaluation.

## 2024-04-26 NOTE — Discharge Instructions (Addendum)
 Please make sure to follow-up with neurology for further management of your headaches.  Please see your primary care doctor tomorrow the day after the get reassessed.  Please take your medications as prescribed.  For ankle swelling and mildly elevated BNP, you can follow-up with cardiology for further assessment.

## 2024-04-26 NOTE — Telephone Encounter (Signed)
 Pt calling in regarding high blood pressure irregardless of medication. States she has been walking into walls, dizzy and headache x 3 weeks. Tension in both shoulders. Blurred vision. 168/99 last bp, hr 79. She took her blood pressure in the morning before medication and took it later that evening and it was still high. Sunday am 169/95 before meds, evening was 160/96. Stressors from life regarding two sons may be playing roll in blood pressure changes. Patient taking 300 mg gabapentin  and two 500 mg tylenol  daily for headaches. Also endorses vision changes from blood piling up in the bottom of eye, recommending patient to go to hospital for evaluation especially with blood in eye. Patient would like me to reach out to provider for further recommendations.

## 2024-04-26 NOTE — ED Provider Notes (Addendum)
 [name] Provider Note    Event Date/Time   First MD Initiated Contact with Patient 04/26/24 2004     (approximate)   History   Hypertension   HPI  Sarah Payne is a 78 y.o. female with history of CAD, diabetes, chronic pain, CKD, COPD, presenting with high blood pressure.  States that she noted her blood pressures to be high is been keeping a log that shows blood pressures in the 160s to 180s.  Denies any chest pain or shortness of breath at this time, states that she has a right posterior headache that has been ongoing for several weeks.  Also noticed a burst of blood vessels to her right eye, states that she intermittently will have blurry vision with some scratchiness, it improves with blinking and lubricating eyedrops.  States that for the past week, she has noticed that she is more unsteady on her feet.  No trauma or falls.  She denies any slurred speech, no visual field deficits, no dizziness or weakness or numbness.  States that she does not use a walker or cane typically.  Did note that her ankles were slightly puffy recently.  On independent chart review, she was seen in the ED on the 24th for similar symptoms, had also been noted to be very stressed lately due to conflicts with her adult sons.  Had a CT head done at that time that did not show any acute intracranial abnormalities.  She was seen by primary care on 29 September, headaches were thought to be chronic secondary to elevated blood pressure.  Symptoms were also thought to be exacerbated by familial stress.  Was also seen by cardiology on the 30th, they increased her carvedilol  to 12.5 mg daily and continued her amlodipine  10 mg daily.     Physical Exam   Triage Vital Signs: ED Triage Vitals  Encounter Vitals Group     BP 04/26/24 1920 (!) 166/89     Girls Systolic BP Percentile --      Girls Diastolic BP Percentile --      Boys Systolic BP Percentile --      Boys Diastolic BP Percentile  --      Pulse Rate 04/26/24 1920 78     Resp 04/26/24 1920 18     Temp 04/26/24 1920 99 F (37.2 C)     Temp Source 04/26/24 1920 Oral     SpO2 04/26/24 1920 95 %     Weight 04/26/24 1921 125 lb (56.7 kg)     Height 04/26/24 1921 5' 6 (1.676 m)     Head Circumference --      Peak Flow --      Pain Score 04/26/24 1921 8     Pain Loc --      Pain Education --      Exclude from Growth Chart --     Most recent vital signs: Vitals:   04/26/24 1920 04/26/24 2130  BP: (!) 166/89 (!) 163/71  Pulse: 78 77  Resp: 18   Temp: 99 F (37.2 C)   SpO2: 95% 96%     General: Awake, no distress.  CV:  Good peripheral perfusion.  Resp:  Normal effort.  No tachypnea or respiratory distress Abd:  No distention.  Soft nontender Other:  Pupils are equal and reactive, extraocular movements are intact, denies blurry vision at this time, she has some subconjunctival hemorrhage to the right eye that is mild.  No periorbital swelling,  no proptosis.  No cranial nerve deficits, no focal weakness or numbness, she has her gait is slightly unsteady, mild edema to her bilateral ankles without any unilateral calf swelling or tenderness   ED Results / Procedures / Treatments   Labs (all labs ordered are listed, but only abnormal results are displayed) Labs Reviewed  BASIC METABOLIC PANEL WITH GFR - Abnormal; Notable for the following components:      Result Value   Potassium 3.4 (*)    Glucose, Bld 192 (*)    Creatinine, Ser 1.45 (*)    Calcium  8.5 (*)    GFR, Estimated 37 (*)    All other components within normal limits  BRAIN NATRIURETIC PEPTIDE - Abnormal; Notable for the following components:   B Natriuretic Peptide 262.5 (*)    All other components within normal limits  TROPONIN I (HIGH SENSITIVITY) - Abnormal; Notable for the following components:   Troponin I (High Sensitivity) 29 (*)    All other components within normal limits  TROPONIN I (HIGH SENSITIVITY) - Abnormal; Notable for the  following components:   Troponin I (High Sensitivity) 31 (*)    All other components within normal limits  CBC  PROTIME-INR     EKG  EKG shows, sinus rhythm, rate of 79, widened QRS, normal QTc, left bundle branch block, PVCs noted, does not meet Sgarbossa's criteria, not significantly changed compared to prior   RADIOLOGY On my independent interpretation, chest x-ray without obvious consolidation   PROCEDURES:  Critical Care performed: No  Procedures   MEDICATIONS ORDERED IN ED: Medications  sodium chloride  0.9 % bolus 500 mL (0 mLs Intravenous Stopped 04/26/24 2206)  metoCLOPramide  (REGLAN ) injection 10 mg (10 mg Intravenous Given 04/26/24 2052)  acetaminophen  (TYLENOL ) tablet 1,000 mg (1,000 mg Oral Given 04/26/24 2047)     IMPRESSION / MDM / ASSESSMENT AND PLAN / ED COURSE  I reviewed the triage vital signs and the nursing notes.                              Differential diagnosis includes, but is not limited to, did consider CVA, deconditioning, anxiety, hypertension, migraine headache, tension headache, did consider sinus venous thrombosis, headache is not thunderclap, not worst headache of her life, did consider aneurysm but this is less likely.  Will get labs, EKG, troponin, MRI head, MRA MRV.  Will give her some fluids as well as migraine cocktail.  Patient's presentation is most consistent with acute presentation with potential threat to life or bodily function.  Independent interpretation of labs and imaging below.  Patient is unable to get her MRI done because she does not have her spinal stimulator charger with her.  On reassessment after migraine cocktail, headache had resolved, patient states that she is feeling a lot better, has steady ambulation now, states that her gait is better, no focal deficits on exam.  Did discuss with her about CT imaging but she would like to hold off for now, would like to go home and follow-up outpatient even that her symptoms had  resolved.  I believe this is reasonable.  Will give her a number to call for neurology to follow-up as well, did tell her to follow-up with primary care doctor tomorrow or the day after to get reassessed and to return if she has any neuro symptoms.  She can also follow-up with cardiology for further assessment of her mild ankle edema and mildly elevated BNP.  Considered but no indication for inpatient admission at this time, she safe for outpatient management.  Will discharge with strict return precautions.  Shared decision making done with patient and she is agreeable with this plan.  The patient is on the cardiac monitor to evaluate for evidence of arrhythmia and/or significant heart rate changes.   Clinical Course as of 04/26/24 2300  Tue Apr 26, 2024  2008 Independent review of labs, no leukocytosis, troponin is mildly elevated, mild AKI, rest of electrolytes not severely deranged [TT]  2014 DG Chest 2 View No active cardiopulmonary disease.  [TT]  2120 Patient has a spinal stimulator, states her son can bring it to her and she just lives 5 minutes away.  Will leave MRIs in. [TT]    Clinical Course User Index [TT] Waymond, Lorelle Cummins, MD     FINAL CLINICAL IMPRESSION(S) / ED DIAGNOSES   Final diagnoses:  Nonintractable headache, unspecified chronicity pattern, unspecified headache type  Unsteadiness  Ankle swelling, unspecified laterality     Rx / DC Orders   ED Discharge Orders     None        Note:  This document was prepared using Dragon voice recognition software and may include unintentional dictation errors.     Waymond Lorelle Cummins, MD 04/26/24 2248    Waymond Lorelle Cummins, MD 04/26/24 2300

## 2024-04-26 NOTE — Telephone Encounter (Signed)
 Pt c/o BP issue: STAT if pt c/o blurred vision, one-sided weakness or slurred speech  1. What are your last 5 BP readings? 107/87 HR76; 106/85 HR 77; 106/96 HR 71; 177/99 HR 83; 168/99 HR 79  2. Are you having any other symptoms (ex. Dizziness, headache, blurred vision, passed out)? Dizziness, blurred vision, headaches (had for 3 weeks )  3. What is your BP issue?

## 2024-04-27 ENCOUNTER — Other Ambulatory Visit (INDEPENDENT_AMBULATORY_CARE_PROVIDER_SITE_OTHER): Admitting: Pharmacist

## 2024-04-27 DIAGNOSIS — E1151 Type 2 diabetes mellitus with diabetic peripheral angiopathy without gangrene: Secondary | ICD-10-CM

## 2024-04-27 NOTE — Progress Notes (Signed)
 04/27/2024 Name: Sarah Payne MRN: [id number] DOB: July 17, 1946  Brief Insulin  Titration Visit PMH also significant for CAD, CHF/takotsubo, HTN, hx postural dizziness/near-syncope, OSA, HLD, COPD, OSA, hypothyroidism, CKD, hyperlipidemia, diabetic neuropathy, tobacco dependence.  Summary of Recent Change:  9/17: ??Tresiba  8 u/d (patient increased to 10u) 9/12: Start Tresiba  5 units daily  Care Team: Primary Care Provider: Gretta Comer MARLA, NP  Cardiologist: Evalene Lunger, MD  Diabetes: Subjective: Denies changes to blood sugars in recent weeks. No low alarms per patient report.   Current medications:  Tresiba  10 units daily Novolog  8 units AC (lunch, dinner) Januvia  100 mg daily  SMBG: CGM (Libre 2)  Today: Time in target (Last 7 Days): Above 36%; In target 63%; Below <1% Average glucose (Last 7 Days): 165 mg/dL Average glucose by time of day:     12 am - 6 am: 155      6 am - 12 pm: 162      12 pm - 6 pm: 173 (1 low)       6 pm - 12 am: 173  Last Visit: Time in target (Last 7 Days): Above 34%; In target 65%; Below 1% Average glucose (Last 7 Days): 161 mg/dL Average glucose by time of day:     12 am - 6 am: 142       6 am - 12 pm: 163      12 pm - 6 pm: 183      6 pm - 12 am: 158  Diet:  Breakfast: skips. Coffee with 3 Splenda and half/half  Lunch (12-12:30 pm): Skips or has a sandwich (estimates eats lunch ~3 days per week) sandwich (tomatoes), sometimes chips, bologna sandwich Dinner (6:30 pm): meat and 2 vegetables (low appetite, often will make dinner and find she is full after several bites, no carb side - Rarely finishes a full meal Snacks: May have small handful of chips Beverages: Sweet tea (makes her own w regular sugar)      _______________________________________________  Objective    Review of Systems:? Limited in the setting of virtual visit  Endocrine:? No polyuria, polyphagia or blurred vision    Physical Examination:   Labs:?  Lab Results   Component Value Date   HGBA1C 9.2 (H) 03/24/2024   HGBA1C 7.7 (H) 12/23/2023   HGBA1C 8.5 (A) 09/03/2023   GLUCOSE 192 (H) 04/26/2024   MICRALBCREAT 259.0 (H) 09/03/2023   CREATININE 1.45 (H) 04/26/2024   CREATININE 1.18 (H) 04/13/2024   CREATININE 1.35 (H) 03/24/2024   GFR 38.29 (L) 03/24/2024   GFR 44.44 (L) 03/03/2023   GFR 42.42 (L) 10/28/2022   Lab Results  Component Value Date   CHOL 102 03/24/2024   LDLCALC 44 03/24/2024   LDLCALC 134 (H) 09/11/2023   LDLCALC 93 03/03/2023   LDLDIRECT 52 08/28/2022   LDLDIRECT 143.0 12/24/2018   HDL 43.80 03/24/2024   TRIG 75.0 03/24/2024   TRIG 112 09/11/2023   TRIG 158.0 (H) 03/03/2023   ALT 21 04/13/2024   ALT 18 03/24/2024   AST 27 04/13/2024   AST 18 03/24/2024     Chemistry      Component Value Date/Time   NA 137 04/26/2024 1923   NA 137 09/24/2022 1421   NA 136 08/02/2014 0408   K 3.4 (L) 04/26/2024 1923   K 3.1 (L) 08/02/2014 0408   CL 101 04/26/2024 1923   CL 100 08/02/2014 0408   CO2 24 04/26/2024 1923   CO2 28 08/02/2014 0408  BUN 18 04/26/2024 1923   BUN 22 09/24/2022 1421   BUN 15 08/02/2014 0408   CREATININE 1.45 (H) 04/26/2024 1923   CREATININE 1.06 (H) 04/04/2016 1721      Component Value Date/Time   CALCIUM  8.5 (L) 04/26/2024 1923   CALCIUM  8.5 08/02/2014 0408   ALKPHOS 71 04/13/2024 0947   ALKPHOS 73 06/12/2014 1703   AST 27 04/13/2024 0947   AST 21 06/12/2014 1703   ALT 21 04/13/2024 0947   ALT 16 06/12/2014 1703   BILITOT 1.0 04/13/2024 0947   BILITOT <0.2 09/24/2022 1421   BILITOT 0.3 06/12/2014 1703        Assessment and Plan:   # Diabetes, type 2: uncontrolled per last A1c of 9.2% (9/4), worsened from 7.7%. Reasonable goal < 7.5% (age, comorbidities) if unable to achieve <7% w/o hypoglycemia. Hx lows 2/2 incorrect prandial administration timing though not a concern over the past several months with correct administration.  FBG remain steady. Often at or slightly above goal of <130  without c/f overnight lows. Lowest AM reading mid 110s.  - Current Regimen: Januvia  100 mg daily, Novolog  8 u ACBID, Tresiba  10 u/d - HCM: Due for eye exam.  Increase Tresiba  from 10 to 11 units daily Continue Novolog  without changes   Follow Up PCP and Cardiology follow up scheduled next week PharmD check-in ~2 weeks after PCP visit   Future Appointments  Date Time Provider Department Center  05/04/2024  1:30 PM Gerard Frederick, NP [facility] None  05/05/2024 11:40 AM Gretta Comer POUR, NP [facility] [address]  05/31/2024  4:00 PM [facility] US  1 [facility] None  06/24/2024 10:20 AM Gretta Comer POUR, NP [facility] [address]  09/21/2024  9:00 AM Gretta Comer POUR, NP [facility] [address]  10/25/2024  1:00 PM [facility] ANNUAL WELLNESS VISIT 2 [facility] [address]   Manuelita FABIENE Kobs, PharmD Clinical Pharmacist [facility] [phone]

## 2024-04-28 ENCOUNTER — Telehealth: Payer: Self-pay | Admitting: Primary Care

## 2024-04-28 ENCOUNTER — Telehealth: Payer: Self-pay | Admitting: Cardiovascular Disease

## 2024-04-28 DIAGNOSIS — I5032 Chronic diastolic (congestive) heart failure: Secondary | ICD-10-CM

## 2024-04-28 DIAGNOSIS — R42 Dizziness and giddiness: Secondary | ICD-10-CM

## 2024-04-28 DIAGNOSIS — E785 Hyperlipidemia, unspecified: Secondary | ICD-10-CM

## 2024-04-28 DIAGNOSIS — R110 Nausea: Secondary | ICD-10-CM

## 2024-04-28 DIAGNOSIS — G4700 Insomnia, unspecified: Secondary | ICD-10-CM

## 2024-04-28 MED ORDER — CLOPIDOGREL BISULFATE 75 MG PO TABS: 75.0000 mg | ORAL_TABLET | Freq: Every day | ORAL | 2 refills | Status: DC

## 2024-04-28 MED ORDER — CARVEDILOL 12.5 MG PO TABS: 12.5000 mg | ORAL_TABLET | Freq: Two times a day (BID) | ORAL | 3 refills | Status: DC

## 2024-04-28 MED ORDER — EZETIMIBE 10 MG PO TABS: 10.0000 mg | ORAL_TABLET | Freq: Every day | ORAL | 2 refills | Status: AC

## 2024-04-28 MED ORDER — ATORVASTATIN CALCIUM 80 MG PO TABS: 80.0000 mg | ORAL_TABLET | Freq: Every day | ORAL | 2 refills | Status: AC

## 2024-04-28 MED ORDER — MIRTAZAPINE 15 MG PO TABS: 15.0000 mg | ORAL_TABLET | Freq: Every day | ORAL | 2 refills | Status: AC

## 2024-04-28 MED ORDER — ONDANSETRON HCL 4 MG PO TABS
4.0000 mg | ORAL_TABLET | Freq: Four times a day (QID) | ORAL | 0 refills | Status: AC | PRN
Start: 2024-04-28 — End: ?

## 2024-04-28 MED ORDER — FUROSEMIDE 20 MG PO TABS: 20.0000 mg | ORAL_TABLET | Freq: Every day | ORAL | 3 refills | Status: DC | PRN

## 2024-04-28 NOTE — Telephone Encounter (Signed)
 I was able to provide refill everything except for the isosorbide  mononitrate.  I will defer that to cardiology.

## 2024-04-28 NOTE — Telephone Encounter (Signed)
*  STAT* If patient is at the pharmacy, call can be transferred to refill team.   1. Which medications need to be refilled? (please list name of each medication and dose if known) carvedilol  (COREG ) 12.5 MG tablet   furosemide  (LASIX ) 20 MG tablet    2. Would you like to learn more about the convenience, safety, & potential cost savings by using the [facility]? No    3. Are you open to using the [facility] (Type [facility]. No    4. Which pharmacy/location (including street and city if local pharmacy) is medication to be sent to?[facility] - [place], [place] - [address] [facility]    5. Do they need a 30 day or 90 day supply? 90 day

## 2024-04-28 NOTE — Telephone Encounter (Signed)
 Copied from CRM #[id number]. Topic: General - Other >> Apr 28, 2024 11:04 AM Donna BRAVO wrote: Reason for CRM: patient called the wrong office for medication refill patient will be calling Dr Gregor office to get these refilled.    ----------------------------------------------------------------------- From previous Reason for Contact - Medication Refill: Medication:  Patient called [facility] and was told to call primary care provider  atorvastatin  (LIPITOR ) 80 MG tablet  carvedilol  (COREG ) 12.5 MG tablet  clopidogrel  (PLAVIX ) 75 MG tablet ezetimibe  (ZETIA ) 10 MG tablet  isosorbide  mononitrate (IMDUR ) 30 MG 24 hr tablet mirtazapine  (REMERON ) 15 MG tablet  ondansetron  (ZOFRAN ) 4 MG tablet     Has the patient contacted their pharmacy?   (Agent: If no, request that the patient contact the pharmacy for the refill. If patient does not wish to contact the pharmacy document the reason why and proceed with request.) (Agent: If yes, when and what did the pharmacy advise?)  This is the patient's preferred pharmacy:  [facility] [address] [place] - [address] [place] [address] Phone: [phone] Fax: [phone]  [facility] - [place], [place] - [address] [place] [phone] Phone: [phone] Fax: [phone]  [facility] - [place], [place] - [address] [facility] [address] [place] [address] Phone: [phone] Fax: [phone]  [facility] [address] [place], [place] - [address] [place] [address] Phone: [phone] Fax: [phone]  Is this the correct pharmacy for this prescription?   If no, delete pharmacy and type the correct one.   Has the prescription been filled recently?    Is the patient out of the medication?    Has the patient been seen for an appointment in the last year OR does the patient have an  upcoming appointment?    Can we respond through MyChart?    Agent: Please be advised that Rx refills may take up to 3 business days. We ask that you follow-up with your pharmacy.

## 2024-04-28 NOTE — Telephone Encounter (Signed)
 Pt's medications were sent to pt's pharmacy as requested. Confirmation received.

## 2024-04-29 ENCOUNTER — Other Ambulatory Visit: Payer: Self-pay | Admitting: Cardiovascular Disease

## 2024-04-29 MED ORDER — ISOSORBIDE MONONITRATE ER 30 MG PO TB24: 30.0000 mg | ORAL_TABLET | Freq: Every day | ORAL | 3 refills | Status: DC

## 2024-05-04 ENCOUNTER — Other Ambulatory Visit: Payer: Self-pay | Admitting: Primary Care

## 2024-05-04 ENCOUNTER — Ambulatory Visit: Attending: Cardiology | Admitting: Cardiology

## 2024-05-04 DIAGNOSIS — E1151 Type 2 diabetes mellitus with diabetic peripheral angiopathy without gangrene: Secondary | ICD-10-CM

## 2024-05-04 NOTE — Progress Notes (Deleted)
 Cardiology Office Note   Date:  05/04/2024  ID:  HLEE FRINGER, DOB 01-07-1946, MRN [id number] PCP: Gretta Comer MARLA, NP  [facility] Providers Cardiologist:  Evalene Lunger, MD { Click to update primary MD,subspecialty MD or APP then REFRESH:1}    History of Present Illness Sarah Payne is a 78 y.o. female with a past medical history of coronary artery disease status post PCI/DES to the distal RCA (08/2011), mid left circumflex (09/2011), PCI/DES x 2 to the RCA (07/2012), HFimpEF, peripheral arterial disease status post orbital atherectomy and drug-coated balloon angioplasty to the left SFA (08/2020), type 2 diabetes, CKD stage IIIb, intermittent left bundle branch block, hypertension, hyperlipidemia, COPD, hypothyroidism, orthostatic dizziness versus vertigo, and sleep apnea, who presents today for follow-up.   LHC in 07/2012 showed moderate mid LAD disease at the takeoff of the diagonal vessel, severe ostial to mid RCA disease, and an EF of 55% with FFR indicating severe disease involving the RCA which was treated successfully with PCI/DES x 2.   Patient was admitted 12/2015 with stress induced cardiomyopathy.  Echo at that time showed EF 25 to 30% with wall motion abnormality concerning for Takotsubo, G1 DD.  LHC 12/2015 showed patent stents.  Follow-up echo in 03/2016 showed normalization of LV systolic function with EF 60 to 65%, normal wall motion, and G1 DD.   Cardiac monitoring in 03/2021 showed predominant sinus rhythm with average heart rate of 80 bpm, 3 episodes of SVT with the longest lasting 7 beats, and rare atrial and ventricular ectopy.  No significant arrhythmias to explain symptoms of syncope.  Echo 03/2021 demonstrated EF of 55%, no RWMA, mild LVH, and G1 DD.  Carotid artery ultrasound 03/2021 demonstrated 1 to 39% bilateral ICA stenoses with antegrade flow the 5 bilateral vertebral arteries and normal flow hemodynamics in the bilateral subclavian arteries.  Patient underwent  shoulder arthroplasty 09/2021 with postoperative course complicated by acute onset shortness of breath and lethargy.  She was noted to be hyperkalemic with potassium of 5.9 which improved with treatment.  Chest x-ray showed mild pulmonary edema with bilateral pleural effusions with improvement with diuresis.  Echocardiogram revealed LVEF 55 to 60%, wall motion abnormality possible secondary to LBBB, G1 DD, spironolactone  was held due to hypokalemia.  Imdur  meclizine  was also discontinued at discharge.  Seen in the office for follow-up 08/2022 and noted recent stress due to conflict with daughter.  Given history of Takotsubo in the setting of acute family stressors in 2017 repeat echocardiogram was ordered to evaluate for recurrent stress-induced cardiomyopathy.  Echocardiogram completed in 09/2022 showed an EF of 55 to 60%, G1 DD, mild MR, Moderate LVH, wall motion abnormality consistent with LBBB.  Carotid ultrasound left lower extremity arterial duplex and ABIs were ordered and stable when compared to prior.  Evaluated in the emergency department high blood pressure readings of 220/110 at home associated with generalized headache x 1 week.  She also reported intermittent fleeting episodes of chest discomfort not related to exertion.  She was started on amlodipine  10 mg daily on discharge in addition to her PTA antihypertensive regimen.  She was provided Valium  for anxiety.  She was seen and followed by her PCP on 9/29 with a blood pressure 170/88.  Additional trial titration of antihypertensive medication will defer to cardiology.   She was seen in clinic 04/19/2024 with ongoing concern for elevated blood pressures for the past week.  Home blood pressures continue to run 160s to 180s systolic.  She also reported  worsening dyspnea on exertion since elevated blood pressures.  She reported 1 fleeting episode of chest discomfort which was not associated with exertion and resolved spontaneously after a few minutes.   She was continued on amlodipine  10 mg daily, Lasix  10 mg 3 times per week, and Imdur  30 mg daily.  Recommend continuing to log blood pressure approximately 30 minutes after taking medications.  If headache persist despite blood pressure control may need to consider discontinuing Imdur .  If blood pressure remain elevated consider addition of ARB therapy.  Her carvedilol  was increased to 12.5 mg twice daily she was counseled for an updated echocardiogram as scheduled for follow-up.  She returns to clinic today   ROS: 10 point review of system has been reviewed and considered negative with exception with listed in HPI  Studies Reviewed      *** Risk Assessment/Calculations   No BP recorded.  {Refresh Note OR Click here to enter BP  :1}***       Physical Exam VS:  There were no vitals taken for this visit.       Wt Readings from Last 3 Encounters:  04/26/24 125 lb (56.7 kg)  04/19/24 129 lb 9.6 oz (58.8 kg)  04/18/24 128 lb 2 oz (58.1 kg)    GEN: Well nourished, well developed in no acute distress NECK: No JVD; No carotid bruits CARDIAC: ***RRR, no murmurs, rubs, gallops RESPIRATORY:  Clear to auscultation without rales, wheezing or rhonchi  ABDOMEN: Soft, non-tender, non-distended EXTREMITIES:  No edema; No deformity   ASSESSMENT AND PLAN Hypertension Coronary artery disease HFimpEF/stress-induced cardiomyopathy Hyperlipidemia Peripheral arterial disease Intermittent left bundle branch block Carotid artery stenosis Tobacco abuse    {Are you ordering a CV Procedure (e.g. stress test, cath, DCCV, TEE, etc)?   Press F2        :[id number]}  Dispo: ***  Signed, Natara Monfort, NP

## 2024-05-05 ENCOUNTER — Encounter: Payer: Self-pay | Admitting: Primary Care

## 2024-05-05 ENCOUNTER — Ambulatory Visit: Admitting: Primary Care

## 2024-05-05 VITALS — BP 118/66 | HR 54 | Temp 97.0°F | Ht 66.0 in | Wt 138.0 lb

## 2024-05-05 DIAGNOSIS — I110 Hypertensive heart disease with heart failure: Secondary | ICD-10-CM

## 2024-05-05 DIAGNOSIS — I5032 Chronic diastolic (congestive) heart failure: Secondary | ICD-10-CM

## 2024-05-05 DIAGNOSIS — I10 Essential (primary) hypertension: Secondary | ICD-10-CM

## 2024-05-05 NOTE — Progress Notes (Signed)
 Subjective:    Patient ID: [name], female    DOB: 10-Feb-1946, 78 y.o.   MRN: [id number]  NAASIA WEILBACHER is a very pleasant 78 y.o. female with a significant medical history including CAD, CHF, cardiomyopathy, type 2 diabetes, OSA, COPD, hypothyroidism, chronic back pain, hyperlipidemia who presents today for ED follow-up.  Evaluated at [facility] ED on 04/26/2024 for elevated blood pressure readings at home.  Readings were ranging 160-180 systolic.  Also with symptoms of blurry vision, instability. Evaluated on 04/13/24 for similar symptoms.  She underwent CT head which was negative for acute abnormalities.  During this visit MRI brain was recommended but she did not have her spinal cord stimulator charger with her.  Labs showed mildly elevated BNP, negative troponins.  She was treated with migraine cocktail and felt much better.  She was discharged home later that day.  No medication changes were made.  Today she is compliant to carvedilol  12.5 twice daily, amlodipine  10 mg daily. She is checking her BP at home which is running higher. Her headache has improved. She has taken a gabapentin  300 mg tablet this morning which has helped. She is not weighing herself at home. She is compliant to her furosemide  20 mg daily.   BP Readings from Last 3 Encounters:  05/05/24 118/66  04/26/24 (!) 159/71  04/19/24 (!) 166/78   Wt Readings from Last 3 Encounters:  05/05/24 138 lb (62.6 kg)  04/26/24 125 lb (56.7 kg)  04/19/24 129 lb 9.6 oz (58.8 kg)      Review of Systems  Constitutional:  Positive for fatigue.  Respiratory:  Positive for shortness of breath.   Cardiovascular:  Negative for chest pain.  Musculoskeletal:  Positive for back pain.  Neurological:  Negative for headaches.         Past Medical History:  Diagnosis Date   Acute pain of left shoulder 01/17/2019   Aortic atherosclerosis    CHF (congestive heart failure) (HCC) 09/16/2013   a.) TTE 09/16/2013: EF 55-60%; mod inferior  HK, triv TR; G1DD. b.) TTE 01/02/2016: EF 25-30%; mid-apicalateroseptal, lat, inf, inferoseptal, apical akinesis; triv TR; G1DD. c. TTE 04/16/2016: EF 65%; G1DD. d.) TTE 05/02/2020: EF 50-55%; G2DD. e.) TTE 04/11/2021: EF 55%; G1DD.   Chronic low back pain with bilateral sciatica    Chronic painful diabetic neuropathy (HCC)    Chronic radicular lumbar pain    CKD (chronic kidney disease), stage III (HCC)    Clostridioides difficile infection 2015   COPD (chronic obstructive pulmonary disease) (HCC)    Coronary artery disease    a.) PCI 02/06/2011: 100% mLCx - DES x 3. b.) PCI 09/17/2011: 95% dRCA - DES x 1. c.) PCI 10/02/2011: 75% pLCX - DES x 1. d.) PCI 08/19/2012: DES x 2 p-mRCA.  e.) LHC 01/01/16: Takotsubo event 01/01/2016 with patent stents   Depression    Diverticulosis    Failed back syndrome    Frequent PVCs    a. Noted in hospital 12/2015.   GERD (gastroesophageal reflux disease)    History of bilateral cataract extraction 01/2015   History of heart artery stent    a.) TOTAL of 7 stents --> 02/06/2011 - overlapping 2.5 x 12 mm Xience V, 2.5 x 8 mm Xience V, 2.25 x 8 mm Xience Nano to mLCx; 09/17/2011 - 2.5 x 23 Xience V dRCA; 10/02/2011 - 2.5 x 15 mm Xience V pLCx; 08/09/2012 - overlapping 3.0 x 38mm mRCA and 3.5 x 23mm pRCA   Hyperlipidemia  Hypertension    Hypothyroidism    Impingement syndrome of left shoulder    LBBB (left bundle branch block)    Long term current use of clopidogrel     Marijuana abuse    Myocardial infarction [facility])    a.) multiple MIs;  5 per her report   Nephrolithiasis    OSA (obstructive sleep apnea)    a.) mild; does not require nocturnal PAP therapy   Pancreatitis 09/19/2022   Paroxysmal SVT (supraventricular tachycardia) 05/08/2021   a.)  Zio patch 05/08/2021: 3 distinct SVT runs; fastest 5 beats at a rate of 146 bpm; longest 7 beats at rate of 134 bpm   Renal cyst, left    Stress at home    a.) husband has PTSD   T2DM (type 2 diabetes mellitus)  (HCC)    Takotsubo cardiomyopathy    a. 12/2015 - nephew committed suicide 1 week prior, sister died the morning of presentation - initially called a STEMI; cath with patent stents. LVEF 25-30%.   Tendonitis of left rotator cuff    Tobacco abuse    Vaginal burning 03/22/2019   Vascular dementia     Social History   Socioeconomic History   Marital status: Married    Spouse name: Laurier   Number of children: Not on file   Years of education: Not on file   Highest education level: Not on file  Occupational History   Not on file  Tobacco Use   Smoking status: Every Day    Current packs/day: 1.00    Average packs/day: 1 pack/day for 45.0 years (45.0 ttl pk-yrs)    Types: Cigarettes   Smokeless tobacco: Never   Tobacco comments:    Has cut back, trying to quit.   Vaping Use   Vaping status: Some Days   Substances: Nicotine   Substance and Sexual Activity   Alcohol use: No    Alcohol/week: 0.0 standard drinks of alcohol   Drug use: Yes    Types: Marijuana    Comment: last noc   Sexual activity: Not on file  Other Topics Concern   Not on file  Social History Narrative   Lives at home with her husband in [place].  Previously used marijuana - quit.      Regular exercise: no/ pain from a frozen rotator cuff   Caffeine use: coffee daily and pepsi      Does not have a living will.   Daughters and husband know her wishes- would desire CPR but not prolonged life support if futile   Social Drivers of Corporate investment banker Strain: Low Risk  (10/22/2023)   Overall Financial Resource Strain (CARDIA)    Difficulty of Paying Living Expenses: Not hard at all  Food Insecurity: No Food Insecurity (01/27/2024)   Hunger Vital Sign    Worried About Running Out of Food in the Last Year: Never true    Ran Out of Food in the Last Year: Never true  Transportation Needs: No Transportation Needs (01/27/2024)   PRAPARE - Administrator, Civil Service (Medical): No    Lack of  Transportation (Non-Medical): No  Physical Activity: Insufficiently Active (10/22/2023)   Exercise Vital Sign    Days of Exercise per Week: 3 days    Minutes of Exercise per Session: 20 min  Stress: No Stress Concern Present (10/22/2023)   [employer] of Occupational Health - Occupational Stress Questionnaire    Feeling of Stress : Only a little  Social Connections:  Moderately Isolated (10/22/2023)   Social Connection and Isolation Panel    Frequency of Communication with Friends and Family: Twice a week    Frequency of Social Gatherings with Friends and Family: Once a week    Attends Religious Services: Never    Database administrator or Organizations: No    Attends Banker Meetings: Never    Marital Status: Married  Catering manager Violence: Unknown (01/27/2024)   Humiliation, Afraid, Rape, and Kick questionnaire    Fear of Current or Ex-Partner: No    Emotionally Abused: No    Physically Abused: Not on file    Sexually Abused: No    Past Surgical History:  Procedure Laterality Date   ABDOMINAL AORTOGRAM W/LOWER EXTREMITY N/A 09/12/2020   Procedure: ABDOMINAL AORTOGRAM W/LOWER EXTREMITY;  Surgeon: Darron Deatrice LABOR, MD;  Location: [facility] INVASIVE CV LAB;  Service: Cardiovascular;  Laterality: N/A;   ABDOMINAL HYSTERECTOMY     APPENDECTOMY     BACK SURGERY     CARDIAC CATHETERIZATION N/A 01/01/2016   Procedure: Left Heart Cath and Coronary Angiography;  Surgeon: Candyce GORMAN Reek, MD;  Location[facility] INVASIVE CV LAB;  Service: Cardiovascular;  Laterality: N/A;   CATARACT EXTRACTION W/PHACO Left 01/30/2015   Procedure: CATARACT EXTRACTION PHACO AND INTRAOCULAR LENS PLACEMENT (IOC);  Surgeon: Elsie Carmine, MD;  Location: [facility] ORS;  Service: Ophthalmology;  Laterality: Left;  US  00:47    CATARACT EXTRACTION W/PHACO Right 02/13/2015   Procedure: CATARACT EXTRACTION PHACO AND INTRAOCULAR LENS PLACEMENT (IOC);  Surgeon: Elsie Carmine, MD;  Location: [facility] ORS;  Service:  Ophthalmology;  Laterality: Right;  cassette lot # [id number] H US   00:29.9 AP  20.7 CDE  6.20   COLONOSCOPY N/A 11/02/2014   Procedure: COLONOSCOPY;  Surgeon: Lamar JONETTA Aho, MD;  Location: [facility] ENDOSCOPY;  Service: Endoscopy;  Laterality: N/A;   CORONARY ANGIOPLASTY WITH STENT PLACEMENT Left 02/06/2011   Procedure: CORONARY ANGIOPLASTY WITH STENT PLACEMENT; Location: [facility]; Surgeon: Margie Lovelace, MD   CORONARY ANGIOPLASTY WITH STENT PLACEMENT Left 09/17/2011   Procedure: CORONARY ANGIOPLASTY WITH STENT PLACEMENT; Location: [facility]; Surgeon: Margie Lovelace, MD   CORONARY ANGIOPLASTY WITH STENT PLACEMENT Left 10/02/2011   Procedure: CORONARY ANGIOPLASTY WITH STENT PLACEMENT; Location: [facility]; Surgeon: Margie Lovelace, MD   CORONARY ANGIOPLASTY WITH STENT PLACEMENT Left 08/19/2012   Procedure: CORONARY ANGIOPLASTY WITH STENT PLACEMENT; Location: [facility]; Surgeon: Deatrice Darron, MD   ESOPHAGOGASTRODUODENOSCOPY (EGD) WITH PROPOFOL  N/A 09/21/2018   Procedure: ESOPHAGOGASTRODUODENOSCOPY (EGD) WITH PROPOFOL ;  Surgeon: Therisa Bi, MD;  Location: [facility] ENDOSCOPY;  Service: Gastroenterology;  Laterality: N/A;   ESOPHAGOGASTRODUODENOSCOPY (EGD) WITH PROPOFOL  N/A 10/06/2022   Procedure: ESOPHAGOGASTRODUODENOSCOPY (EGD) WITH PROPOFOL ;  Surgeon: Therisa Bi, MD;  Location: [facility] ENDOSCOPY;  Service: Gastroenterology;  Laterality: N/A;   EYE SURGERY     LEFT HEART CATH AND CORONARY ANGIOGRAPHY Left 08/02/2014   Procedure: LEFT HEART CATH AND CORONARY ANGIOGRAPHY; Location: [facility]; Surgeon: Alm Clay, MD   PERIPHERAL VASCULAR ATHERECTOMY Left 09/12/2020   Procedure: PERIPHERAL VASCULAR ATHERECTOMY;  Surgeon: Darron Deatrice LABOR, MD;  Location: [facility] INVASIVE CV LAB;  Service: Cardiovascular;  Laterality: Left;   PERIPHERAL VASCULAR BALLOON ANGIOPLASTY  09/12/2020   Procedure: PERIPHERAL VASCULAR BALLOON ANGIOPLASTY;  Surgeon: Darron Deatrice LABOR, MD;  Location: [facility] INVASIVE CV LAB;  Service: Cardiovascular;;   REVERSE  SHOULDER ARTHROPLASTY Left 10/01/2021   Procedure: REVERSE SHOULDER ARTHROPLASTY WITH BICEPS TENODESIS.;  Surgeon: Edie Norleen PARAS, MD;  Location: [facility] ORS;  Service: Orthopedics;  Laterality: Left;   SHOULDER SURGERY Left 2017  THORACIC LAMINECTOMY FOR SPINAL CORD STIMULATOR N/A 10/27/2023   Procedure: THORACIC LAMINECTOMY FOR SPINAL CORD STIMULATOR PLACEMENT WITH INTERNAL PULSE GENERATOR IMPLANT;  Surgeon: Claudene Penne ORN, MD;  Location: [facility] ORS;  Service: Neurosurgery;  Laterality: N/A;  THORACIC LAMINECTOMY FOR SPINAL CORD STIMULATOR PLACEMENT WITH INTERNAL PULSE GENERATOR IMPLANT    Family History  Problem Relation Age of Onset   Heart attack Mother        First MI @ 35 - Died @ 39   Heart disease Mother    Heart disease Father        Died @ 19   Throat cancer Brother    Liver cancer Brother    Colon cancer Sister     No Known Allergies  Current Outpatient Medications on File Prior to Visit  Medication Sig Dispense Refill   acetaminophen  (TYLENOL ) 650 MG CR tablet Take 1,300 mg by mouth 2 (two) times daily.     amLODipine  (NORVASC ) 10 MG tablet Take 1 tablet (10 mg total) by mouth daily. 90 tablet 0   atorvastatin  (LIPITOR ) 80 MG tablet Take 1 tablet (80 mg total) by mouth daily. for cholesterol. 90 tablet 2   carvedilol  (COREG ) 12.5 MG tablet Take 1 tablet (12.5 mg total) by mouth 2 (two) times daily. 180 tablet 3   clobetasol  cream (TEMOVATE ) 0.05 % Apply 1 Application topically 2 (two) times daily as needed. For vaginal itching. 30 g 0   clopidogrel  (PLAVIX ) 75 MG tablet Take 1 tablet (75 mg total) by mouth daily. 90 tablet 2   Continuous Glucose Receiver (FREESTYLE LIBRE 2 READER) DEVI Use with sensor to check blood sugars 6 times daily. 1 each 0   Continuous Glucose Sensor (FREESTYLE LIBRE 2 SENSOR) MISC USE TO CHECK BLOOD SUGAR. CHANGE EVERY 14 DAYS 6 each 0   diazepam  (VALIUM ) 5 MG tablet Take 1 tablet (5 mg total) by mouth every 12 (twelve) hours as needed for muscle  spasms. 10 tablet 0   docusate sodium  (COLACE) 100 MG capsule Take 1 capsule (100 mg total) by mouth 2 (two) times daily. 10 capsule 0   Evolocumab  (REPATHA  SURECLICK) 140 MG/ML SOAJ Inject 140 mg into the skin every 14 (fourteen) days. 6 mL 3   ezetimibe  (ZETIA ) 10 MG tablet Take 1 tablet (10 mg total) by mouth daily. For cholesterol. 90 tablet 2   furosemide  (LASIX ) 20 MG tablet Take 1 tablet (20 mg total) by mouth daily as needed (As needed for weight gain greater than 3 pounds overnight or increased shortness of breath.). Take one tab only on Monday, Wednesday, & Fridays 36 tablet 3   gabapentin  (NEURONTIN ) 300 MG capsule TAKE 1 CAPSULE BY MOUTH TWICE DAILY FOR PAIN 180 capsule 3   Glucagon  (GVOKE HYPOPEN  2-PACK) 1 MG/0.2ML SOAJ Inject 1 mg (1 pen) as needed for severe low blood sugar (sustained glucose less than 55 despite oral glucose treatments). May repeat in 15 minutes as needed. 0.4 mL 1   insulin  aspart (NOVOLOG  FLEXPEN) 100 UNIT/ML FlexPen INJECT 6-16 UNITS SUBCUTANEOUSLY THREE TIMES DAILY WITH MEALS PER  SLIDING  SCALE  FOR  DIABETES 45 mL 0   insulin  degludec (TRESIBA  FLEXTOUCH) 100 UNIT/ML FlexTouch Pen Inject 10 Units into the skin daily.     Insulin  Pen Needle (INSUPEN PEN NEEDLES) 32G X 4 MM MISC Use to inject insulin  4 times daily. 250 each 1   isosorbide  mononitrate (IMDUR ) 30 MG 24 hr tablet Take 1 tablet (30 mg total) by mouth daily. 90 tablet  3   levothyroxine  (SYNTHROID ) 100 MCG tablet TAKE 1 TABLET BY MOUTH IN THE MORNING ON  AN  EMPTY  STOMACH  WITH  WATER   ONLY.  NO  FOOD  OR  OTHER  MEDICATIONS  FOR  30  MINUTES 90 tablet 2   mirtazapine  (REMERON ) 15 MG tablet Take 1 tablet (15 mg total) by mouth at bedtime. For sleep 90 tablet 2   ondansetron  (ZOFRAN ) 4 MG tablet Take 1 tablet (4 mg total) by mouth every 6 (six) hours as needed for nausea. 15 tablet 0   potassium chloride  (KLOR-CON  M) 10 MEQ tablet Take 10 mEq by mouth every Monday, Wednesday, and Friday.     senna  (SENOKOT) 8.6 MG TABS tablet Take 1 tablet (8.6 mg total) by mouth daily. 120 tablet 0   sitaGLIPtin  (JANUVIA ) 100 MG tablet Take 1 tablet (100 mg total) by mouth daily. for diabetes. 90 tablet 1   No current facility-administered medications on file prior to visit.    BP 118/66   Pulse (!) 54   Temp (!) 97 F (36.1 C) (Temporal)   Ht 5' 6 (1.676 m)   Wt 138 lb (62.6 kg)   SpO2 100%   BMI 22.27 kg/m  Objective:   Physical Exam Cardiovascular:     Rate and Rhythm: Normal rate and regular rhythm.     Comments: Facial puffiness and abdominal distension noted on exam Pulmonary:     Effort: Pulmonary effort is normal.     Breath sounds: Normal breath sounds.  Abdominal:     General: There is distension.  Musculoskeletal:     Cervical back: Neck supple.  Skin:    General: Skin is warm and dry.  Neurological:     Mental Status: She is alert and oriented to person, place, and time.  Psychiatric:        Mood and Affect: Mood normal.     Physical Exam        Assessment & Plan:  Chronic heart failure with preserved ejection fraction (HCC) -     Brain natriuretic peptide  Chronic diastolic CHF (congestive heart failure) (HCC) Assessment & Plan: Suspect acute exacerbation. ED notes, labs reviewed.  Repeat BMP today. Increase furosemide  to 40 mg daily x 3 to 5 days. We also discussed to start weighing daily.  Hospital precautions provided.    Primary hypertension Assessment & Plan: Controlled. ED notes and labs reviewed  Continue carvedilol  12.5 mg twice daily, amlodipine  5 mg daily.     Assessment and Plan Assessment & Plan         Comer MARLA Gaskins, NP    History of Present Illness

## 2024-05-05 NOTE — Assessment & Plan Note (Signed)
 Controlled. ED notes and labs reviewed  Continue carvedilol  12.5 mg twice daily, amlodipine  5 mg daily.

## 2024-05-05 NOTE — Patient Instructions (Signed)
 Stop by the lab prior to leaving today. I will notify you of your results once received.   It was a pleasure to see you today!

## 2024-05-05 NOTE — Assessment & Plan Note (Signed)
 Suspect acute exacerbation. ED notes, labs reviewed.  Repeat BMP today. Increase furosemide  to 40 mg daily x 3 to 5 days. We also discussed to start weighing daily.  Hospital precautions provided.

## 2024-05-06 ENCOUNTER — Telehealth: Payer: Self-pay | Admitting: Primary Care

## 2024-05-06 NOTE — Telephone Encounter (Signed)
 Noted.  Please check on patient on Monday to see how her breathing and symptoms are going since I had her increase her fluid pill for 3 to 5 days.  If her shortness of breath and symptoms are improving then okay to hold off on lab.  If not then she will need to come in for BNP.

## 2024-05-06 NOTE — Telephone Encounter (Signed)
 [facility] lost the patients sample for the BNP.  They said even if they found it, it wouldn't be a good sample to run.

## 2024-05-09 ENCOUNTER — Ambulatory Visit: Attending: Cardiology | Admitting: Cardiology

## 2024-05-09 ENCOUNTER — Other Ambulatory Visit
Admission: RE | Admit: 2024-05-09 | Discharge: 2024-05-09 | Disposition: A | Discharge status: Home / Self Care | Source: Ambulatory Visit | Attending: Cardiology | Admitting: Cardiology

## 2024-05-09 ENCOUNTER — Encounter: Payer: Self-pay | Admitting: Cardiology

## 2024-05-09 VITALS — BP 130/50 | HR 56 | Ht 66.0 in | Wt 137.4 lb

## 2024-05-09 DIAGNOSIS — E1151 Type 2 diabetes mellitus with diabetic peripheral angiopathy without gangrene: Secondary | ICD-10-CM

## 2024-05-09 DIAGNOSIS — E785 Hyperlipidemia, unspecified: Secondary | ICD-10-CM

## 2024-05-09 DIAGNOSIS — I5020 Unspecified systolic (congestive) heart failure: Secondary | ICD-10-CM

## 2024-05-09 DIAGNOSIS — I779 Disorder of arteries and arterioles, unspecified: Secondary | ICD-10-CM | POA: Diagnosis not present

## 2024-05-09 DIAGNOSIS — E876 Hypokalemia: Secondary | ICD-10-CM

## 2024-05-09 DIAGNOSIS — R072 Precordial pain: Secondary | ICD-10-CM | POA: Insufficient documentation

## 2024-05-09 DIAGNOSIS — I739 Peripheral vascular disease, unspecified: Secondary | ICD-10-CM | POA: Diagnosis not present

## 2024-05-09 DIAGNOSIS — I2510 Atherosclerotic heart disease of native coronary artery without angina pectoris: Secondary | ICD-10-CM

## 2024-05-09 DIAGNOSIS — I10 Essential (primary) hypertension: Secondary | ICD-10-CM

## 2024-05-09 DIAGNOSIS — Z720 Tobacco use: Secondary | ICD-10-CM | POA: Diagnosis not present

## 2024-05-09 DIAGNOSIS — I5181 Takotsubo syndrome: Secondary | ICD-10-CM

## 2024-05-09 LAB — COMPREHENSIVE METABOLIC PANEL WITH GFR
ALT: 42 U/L (ref 0–44)
AST: 31 U/L (ref 15–41)
Albumin: 3.6 g/dL (ref 3.5–5.0)
Alkaline Phosphatase: 103 U/L (ref 38–126)
Anion gap: 11 (ref 5–15)
BUN: 22 mg/dL (ref 8–23)
CO2: 27 mmol/L (ref 22–32)
Calcium: 8.8 mg/dL — ABNORMAL LOW (ref 8.9–10.3)
Chloride: 104 mmol/L (ref 98–111)
Creatinine, Ser: 1.39 mg/dL — ABNORMAL HIGH (ref 0.44–1.00)
GFR, Estimated: 39 mL/min — ABNORMAL LOW (ref >60)
Glucose, Bld: 117 mg/dL — ABNORMAL HIGH (ref 70–99)
Potassium: 3.5 mmol/L (ref 3.5–5.1)
Sodium: 142 mmol/L (ref 135–145)
Total Bilirubin: 0.3 mg/dL (ref 0.0–1.2)
Total Protein: 7 g/dL (ref 6.5–8.1)

## 2024-05-09 LAB — CBC
HCT: 34.2 % — ABNORMAL LOW (ref 36.0–46.0)
Hemoglobin: 11.4 g/dL — ABNORMAL LOW (ref 12.0–15.0)
MCH: 31.2 pg (ref 26.0–34.0)
MCHC: 33.3 g/dL (ref 30.0–36.0)
MCV: 93.7 fL (ref 80.0–100.0)
Platelets: 276 K/uL (ref 150–400)
RBC: 3.65 MIL/uL — ABNORMAL LOW (ref 3.87–5.11)
RDW: 14.8 % (ref 11.5–15.5)
WBC: 7.2 K/uL (ref 4.0–10.5)
nRBC: 0 % (ref 0.0–0.2)

## 2024-05-09 LAB — TROPONIN I (HIGH SENSITIVITY): Troponin I (High Sensitivity): 15 ng/L (ref <18)

## 2024-05-09 NOTE — H&P (View-Only) (Signed)
 Cardiology Office Note   Date:  05/09/2024  ID:  Mel, Tadros 1945/12/07, MRN [id number] PCP: Gretta Comer MARLA, NP  [facility] Providers Cardiologist:  Evalene Lunger, MD     History of Present Illness Sarah Payne is a 78 y.o. female with a past medical history of coronary artery disease status post PCI/DES to the distal RCA (08/2011), mid left circumflex (09/2011), PCI/DES x 2 to the RCA (07/2012), HFimpEF, peripheral arterial disease status post orbital atherectomy and drug-coated balloon angioplasty to the left SFA (08/2020), type 2 diabetes, CKD stage IIIb, intermittent left bundle branch block, hypertension, hyperlipidemia, COPD, hypothyroidism, orthostatic dizziness versus vertigo, and sleep apnea, who presents today for follow-up.   LHC in 07/2012 showed moderate mid LAD disease at the takeoff of the diagonal vessel, severe ostial to mid RCA disease, and an EF of 55% with FFR indicating severe disease involving the RCA which was treated successfully with PCI/DES x 2.   Patient was admitted 12/2015 with stress induced cardiomyopathy.  Echo at that time showed EF 25 to 30% with wall motion abnormality concerning for Takotsubo, G1 DD.  LHC 12/2015 showed patent stents.  Follow-up echo in 03/2016 showed normalization of LV systolic function with EF 60 to 65%, normal wall motion, and G1 DD.   Cardiac monitoring in 03/2021 showed predominant sinus rhythm with average heart rate of 80 bpm, 3 episodes of SVT with the longest lasting 7 beats, and rare atrial and ventricular ectopy.  No significant arrhythmias to explain symptoms of syncope.  Echo 03/2021 demonstrated EF of 55%, no RWMA, mild LVH, and G1 DD.  Carotid artery ultrasound 03/2021 demonstrated 1 to 39% bilateral ICA stenoses with antegrade flow the 5 bilateral vertebral arteries and normal flow hemodynamics in the bilateral subclavian arteries.  Patient underwent shoulder arthroplasty 09/2021 with postoperative course complicated  by acute onset shortness of breath and lethargy.  She was noted to be hyperkalemic with potassium of 5.9 which improved with treatment.  Chest x-ray showed mild pulmonary edema with bilateral pleural effusions with improvement with diuresis.  Echocardiogram revealed LVEF 55 to 60%, wall motion abnormality possible secondary to LBBB, G1 DD, spironolactone  was held due to hypokalemia.  Imdur  meclizine  was also discontinued at discharge.  Seen in the office for follow-up 08/2022 and noted recent stress due to conflict with daughter.  Given history of Takotsubo in the setting of acute family stressors in 2017 repeat echocardiogram was ordered to evaluate for recurrent stress-induced cardiomyopathy.  Echocardiogram completed in 09/2022 showed an EF of 55 to 60%, G1 DD, mild MR, Moderate LVH, wall motion abnormality consistent with LBBB.  Carotid ultrasound left lower extremity arterial duplex and ABIs were ordered and stable when compared to prior.  Evaluated in the emergency department high blood pressure readings of 220/110 at home associated with generalized headache x 1 week.  She also reported intermittent fleeting episodes of chest discomfort not related to exertion.  She was started on amlodipine  10 mg daily on discharge in addition to her PTA antihypertensive regimen.  She was provided Valium  for anxiety.  She was seen and followed by her PCP on 9/29 with a blood pressure 170/88.  Additional trial titration of antihypertensive medication will defer to cardiology.   She was seen in clinic 04/19/2024 with ongoing concern for elevated blood pressures for the past week.  Home blood pressures continue to run 160s to 180s systolic.  She also reported worsening dyspnea on exertion since elevated blood pressures.  She  reported 1 fleeting episode of chest discomfort which was not associated with exertion and resolved spontaneously after a few minutes.  She was continued on amlodipine  10 mg daily, Lasix  10 mg 3 times  per week, and Imdur  30 mg daily.  Recommend continuing to log blood pressure approximately 30 minutes after taking medications.  If headache persist despite blood pressure control may need to consider discontinuing Imdur .  If blood pressure remain elevated consider addition of ARB therapy.  Her carvedilol  was increased to 12.5 mg twice daily she was counseled for an updated echocardiogram as scheduled for follow-up.  She returns to clinic today with recent complaints of chest discomfort and chest pain that has left her with continuing fatigue, shortness of breath, and weakness.  She states that it was a tightness and heaviness in her chest that was different from previous episodes that she had previously.  Previously when she stated she had 5 stents in the past and had been tremendous back pain that required her to lie down.  Afterwards she had incredible fatigue and shortness of breath which has not been prevalent. These episodes where more related to exertion than with rest. She has had recent issues with uncontrollable hypertension and issues with her blood sugar.  She has had multiple emergency department visits.  She has had several changes to recent medications.  She has also had recent swelling to her bilateral lower extremities which was new for her.  Unfortunately she was scheduled for an echocardiogram but cannot be scheduled until the middle of November due to scheduling issues.  She stated that with her swelling to bilateral lower extremities her primary care provider recently increased her furosemide .  She states that she has been compliant with her current medication regimen without any undue side effects.  ROS: 10 point review of system has been reviewed and considered negative with exception with listed in HPI  Studies Reviewed EKG Interpretation Date/Time:  Monday May 09 2024 15:23:27 EDT Ventricular Rate:  56 PR Interval:  216 QRS Duration:  98 QT Interval:  456 QTC  Calculation: 440 R Axis:   -18  Text Interpretation: Sinus bradycardia with sinus arrhythmia with 1st degree A-V block Serial changes of evolving Anteroseptal infarct Present Artifact Confirmed by Gerard Frederick ([id number]) on 05/09/2024 4:01:17 PM    09/2022 Carotid doppler 1 to 39% narrowing of the bilateral internal carotid arteries    09/2022 LLE arterial doppler Left: No significant change as compared to previous study. Patent  PTA/Atherectomy site without stenosis.    09/2022 ABI Right: Resting right ankle-brachial index is within normal range. The right toe-brachial index is abnormal.   Left: Resting left ankle-brachial index is within normal range. The left toe-brachial index is abnormal.    09/2022 Echo complete 1. Left ventricular ejection fraction, by estimation, is 55 to 60%. Left  ventricular ejection fraction by PLAX is 54 %. The left ventricle has  normal function. Septal wall dyskinesis secondary to bundle branch block.  There is moderate left ventricular  hypertrophy. Left ventricular diastolic parameters are consistent with  Grade I diastolic dysfunction (impaired relaxation). The average left  ventricular global longitudinal strain is -9.5 %.   2. Right ventricular systolic function is normal. The right ventricular  size is normal. Tricuspid regurgitation signal is inadequate for assessing  PA pressure.   3. The mitral valve is normal in structure. Mild mitral valve  regurgitation. No evidence of mitral stenosis.   4. The aortic valve has an indeterminant number of  cusps. Aortic valve  regurgitation is not visualized. Aortic valve sclerosis/calcification is  present, without any evidence of aortic stenosis.   5. The inferior vena cava is normal in size with greater than 50%  respiratory variability, suggesting right atrial pressure of 3 mmHg.  Risk Assessment/Calculations         Physical Exam VS:  BP (!) 130/50 (BP Location: Left Arm, Patient Position: Sitting, Cuff  Size: Normal)   Pulse (!) 56 Comment: 58 oximeter  Ht 5' 6 (1.676 m)   Wt 137 lb 6.4 oz (62.3 kg)   SpO2 95%   BMI 22.18 kg/m        Wt Readings from Last 3 Encounters:  05/09/24 137 lb 6.4 oz (62.3 kg)  05/05/24 138 lb (62.6 kg)  04/26/24 125 lb (56.7 kg)    GEN: Well nourished, well developed in no acute distress NECK: No JVD; No carotid bruits CARDIAC: RRR, bradycardic, no murmurs, rubs, gallops RESPIRATORY:  Clear to auscultation without rales, wheezing or rhonchi  ABDOMEN: Soft, non-tender, non-distended EXTREMITIES:  No edema; No deformity   ASSESSMENT AND PLAN Coronary artery disease status post multiple PCI/DES.  Chest pain concerning for unstable angina.  She had PCI/DES to the distal RCA 08/2011, mid left circumflex in 09/2011, PCI/DES x 2 to RCA in 07/2012.  EKG today showed sinus bradycardia with first-degree AV block with a rate of 56 with ST and T wave changes in anterolateral leads.  She is complaining of chest discomfort, shortness of breath, and worsening fatigue.  Shortness of breath fatigue with prior signs for previous episodes of CAD prior to stent placement.  With EKG changes and complaints of chest pain and shortness of breath, weakness, and fatigue, in a diabetic female, she is being scheduled for right and left heart catheterization for concerns of unstable angina.  She has been sent to the medical mall for labs today of CBC, BMP, troponin.  For any critical labs she has been advised she will be called and advised to report to the emergency department for further evaluation.  She has also been advised on ED precautions if she continues to have ongoing chest discomfort.  She has been continued on clopidogrel  75 mg daily, ezetimibe  10 mg daily, Repatha  140 mg injection every 2 weeks and carvedilol .  HFimpEF/stress-induced cardiomyopathy echocardiogram pending.  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  Given recent  increase in life stressors recently worsening dyspnea on exertion updated echocardiogram was ordered.  Previously did not tolerate MRA due to hyperkalemia.  She is continued on furosemide  20 mg 3 times a week.  She has swelling into the bilateral lower extremities that is trace.  No additional medication changes today.  Hypertension with a blood pressure of 130/58 blood pressure has been better controlled on current medication regimen.  She has been continued on amlodipine  10 mg daily, carvedilol  12.5 mg twice daily, furosemide  20 mg 3 times weekly, Imdur  30 mg daily.  She has been encouraged to monitor her pressure 1 to 2 hours postmedication administration at home as well.  Hyperlipidemia with an LDL 44 that remains at goal in 03/2024.  She is continued on statin, ezetimibe , and Repatha .  Peripheral arterial disease status post intervention to the left SFA in 08/2020.  Most recent ABI in LAD 09/2022 was stable.  He will likely need follow-up after cardiovascular workup.  She is continued on clopidogrel  75 mg daily, ezetimibe  10 mg daily, Repatha  140 mg  every 2 weeks.  Intermittent left bundle branch block not noted on EKG today.  Previously noted on EKG at last appointment.  Carotid artery stenosis carotid artery ultrasound 3/24 showed 1 to 39% bilateral internal carotid arteries.   Tobacco abuse with total cessation continued to be recommended.  Type 2 diabetes she continues to be on insulin  therapy.  States her blood sugars have been high and all over the place.  Ongoing management per PCP.  Hypokalemia noted on ED visit where she was supplemented with potassium 10 mEq 3 times weekly.  Sent for updated labs today as recently furosemide  was increased by PCP.    Informed Consent   Shared Decision Making/Informed Consent The risks [stroke (1 in 1000), death (1 in 1000), kidney failure [usually temporary] (1 in 500), bleeding (1 in 200), allergic reaction [possibly serious] (1 in 200)], benefits  (diagnostic support and management of coronary artery disease) and alternatives of a cardiac catheterization were discussed in detail with Ms. Yost and she is willing to proceed.     Dispo: Patient to return to clinic to see MD/APP 2 to 3 weeks postprocedure or sooner if needed for further evaluation.  Signed, Katelinn Justice, NP

## 2024-05-09 NOTE — Telephone Encounter (Signed)
 Noted and glad to hear this.

## 2024-05-09 NOTE — Telephone Encounter (Signed)
 Called and spoke with patient, states she is feeling much better. Her SOB has improved and she does not feel as exhausted.

## 2024-05-09 NOTE — Progress Notes (Signed)
 Cardiology Office Note   Date:  05/09/2024  ID:  Mel, Tadros 1945/12/07, MRN [id number] PCP: Gretta Comer MARLA, NP  [facility] Providers Cardiologist:  Evalene Lunger, MD     History of Present Illness Sarah Payne is a 79 y.o. female with a past medical history of coronary artery disease status post PCI/DES to the distal RCA (08/2011), mid left circumflex (09/2011), PCI/DES x 2 to the RCA (07/2012), HFimpEF, peripheral arterial disease status post orbital atherectomy and drug-coated balloon angioplasty to the left SFA (08/2020), type 2 diabetes, CKD stage IIIb, intermittent left bundle branch block, hypertension, hyperlipidemia, COPD, hypothyroidism, orthostatic dizziness versus vertigo, and sleep apnea, who presents today for follow-up.   LHC in 07/2012 showed moderate mid LAD disease at the takeoff of the diagonal vessel, severe ostial to mid RCA disease, and an EF of 55% with FFR indicating severe disease involving the RCA which was treated successfully with PCI/DES x 2.   Patient was admitted 12/2015 with stress induced cardiomyopathy.  Echo at that time showed EF 25 to 30% with wall motion abnormality concerning for Takotsubo, G1 DD.  LHC 12/2015 showed patent stents.  Follow-up echo in 03/2016 showed normalization of LV systolic function with EF 60 to 65%, normal wall motion, and G1 DD.   Cardiac monitoring in 03/2021 showed predominant sinus rhythm with average heart rate of 80 bpm, 3 episodes of SVT with the longest lasting 7 beats, and rare atrial and ventricular ectopy.  No significant arrhythmias to explain symptoms of syncope.  Echo 03/2021 demonstrated EF of 55%, no RWMA, mild LVH, and G1 DD.  Carotid artery ultrasound 03/2021 demonstrated 1 to 39% bilateral ICA stenoses with antegrade flow the 5 bilateral vertebral arteries and normal flow hemodynamics in the bilateral subclavian arteries.  Patient underwent shoulder arthroplasty 09/2021 with postoperative course complicated  by acute onset shortness of breath and lethargy.  She was noted to be hyperkalemic with potassium of 5.9 which improved with treatment.  Chest x-ray showed mild pulmonary edema with bilateral pleural effusions with improvement with diuresis.  Echocardiogram revealed LVEF 55 to 60%, wall motion abnormality possible secondary to LBBB, G1 DD, spironolactone  was held due to hypokalemia.  Imdur  meclizine  was also discontinued at discharge.  Seen in the office for follow-up 08/2022 and noted recent stress due to conflict with daughter.  Given history of Takotsubo in the setting of acute family stressors in 2017 repeat echocardiogram was ordered to evaluate for recurrent stress-induced cardiomyopathy.  Echocardiogram completed in 09/2022 showed an EF of 55 to 60%, G1 DD, mild MR, Moderate LVH, wall motion abnormality consistent with LBBB.  Carotid ultrasound left lower extremity arterial duplex and ABIs were ordered and stable when compared to prior.  Evaluated in the emergency department high blood pressure readings of 220/110 at home associated with generalized headache x 1 week.  She also reported intermittent fleeting episodes of chest discomfort not related to exertion.  She was started on amlodipine  10 mg daily on discharge in addition to her PTA antihypertensive regimen.  She was provided Valium  for anxiety.  She was seen and followed by her PCP on 9/29 with a blood pressure 170/88.  Additional trial titration of antihypertensive medication will defer to cardiology.   She was seen in clinic 04/19/2024 with ongoing concern for elevated blood pressures for the past week.  Home blood pressures continue to run 160s to 180s systolic.  She also reported worsening dyspnea on exertion since elevated blood pressures.  She  reported 1 fleeting episode of chest discomfort which was not associated with exertion and resolved spontaneously after a few minutes.  She was continued on amlodipine  10 mg daily, Lasix  10 mg 3 times  per week, and Imdur  30 mg daily.  Recommend continuing to log blood pressure approximately 30 minutes after taking medications.  If headache persist despite blood pressure control may need to consider discontinuing Imdur .  If blood pressure remain elevated consider addition of ARB therapy.  Her carvedilol  was increased to 12.5 mg twice daily she was counseled for an updated echocardiogram as scheduled for follow-up.  She returns to clinic today with recent complaints of chest discomfort and chest pain that has left her with continuing fatigue, shortness of breath, and weakness.  She states that it was a tightness and heaviness in her chest that was different from previous episodes that she had previously.  Previously when she stated she had 5 stents in the past and had been tremendous back pain that required her to lie down.  Afterwards she had incredible fatigue and shortness of breath which has not been prevalent. These episodes where more related to exertion than with rest. She has had recent issues with uncontrollable hypertension and issues with her blood sugar.  She has had multiple emergency department visits.  She has had several changes to recent medications.  She has also had recent swelling to her bilateral lower extremities which was new for her.  Unfortunately she was scheduled for an echocardiogram but cannot be scheduled until the middle of November due to scheduling issues.  She stated that with her swelling to bilateral lower extremities her primary care provider recently increased her furosemide .  She states that she has been compliant with her current medication regimen without any undue side effects.  ROS: 10 point review of system has been reviewed and considered negative with exception with listed in HPI  Studies Reviewed EKG Interpretation Date/Time:  Monday May 09 2024 15:23:27 EDT Ventricular Rate:  56 PR Interval:  216 QRS Duration:  98 QT Interval:  456 QTC  Calculation: 440 R Axis:   -18  Text Interpretation: Sinus bradycardia with sinus arrhythmia with 1st degree A-V block Serial changes of evolving Anteroseptal infarct Present Artifact Confirmed by Gerard Frederick ([id number]) on 05/09/2024 4:01:17 PM    09/2022 Carotid doppler 1 to 39% narrowing of the bilateral internal carotid arteries    09/2022 LLE arterial doppler Left: No significant change as compared to previous study. Patent  PTA/Atherectomy site without stenosis.    09/2022 ABI Right: Resting right ankle-brachial index is within normal range. The right toe-brachial index is abnormal.   Left: Resting left ankle-brachial index is within normal range. The left toe-brachial index is abnormal.    09/2022 Echo complete 1. Left ventricular ejection fraction, by estimation, is 55 to 60%. Left  ventricular ejection fraction by PLAX is 54 %. The left ventricle has  normal function. Septal wall dyskinesis secondary to bundle branch block.  There is moderate left ventricular  hypertrophy. Left ventricular diastolic parameters are consistent with  Grade I diastolic dysfunction (impaired relaxation). The average left  ventricular global longitudinal strain is -9.5 %.   2. Right ventricular systolic function is normal. The right ventricular  size is normal. Tricuspid regurgitation signal is inadequate for assessing  PA pressure.   3. The mitral valve is normal in structure. Mild mitral valve  regurgitation. No evidence of mitral stenosis.   4. The aortic valve has an indeterminant number of  cusps. Aortic valve  regurgitation is not visualized. Aortic valve sclerosis/calcification is  present, without any evidence of aortic stenosis.   5. The inferior vena cava is normal in size with greater than 50%  respiratory variability, suggesting right atrial pressure of 3 mmHg.  Risk Assessment/Calculations         Physical Exam VS:  BP (!) 130/50 (BP Location: Left Arm, Patient Position: Sitting, Cuff  Size: Normal)   Pulse (!) 56 Comment: 58 oximeter  Ht 5' 6 (1.676 m)   Wt 137 lb 6.4 oz (62.3 kg)   SpO2 95%   BMI 22.18 kg/m        Wt Readings from Last 3 Encounters:  05/09/24 137 lb 6.4 oz (62.3 kg)  05/05/24 138 lb (62.6 kg)  04/26/24 125 lb (56.7 kg)    GEN: Well nourished, well developed in no acute distress NECK: No JVD; No carotid bruits CARDIAC: RRR, bradycardic, no murmurs, rubs, gallops RESPIRATORY:  Clear to auscultation without rales, wheezing or rhonchi  ABDOMEN: Soft, non-tender, non-distended EXTREMITIES:  No edema; No deformity   ASSESSMENT AND PLAN Coronary artery disease status post multiple PCI/DES.  Chest pain concerning for unstable angina.  She had PCI/DES to the distal RCA 08/2011, mid left circumflex in 09/2011, PCI/DES x 2 to RCA in 07/2012.  EKG today showed sinus bradycardia with first-degree AV block with a rate of 56 with ST and T wave changes in anterolateral leads.  She is complaining of chest discomfort, shortness of breath, and worsening fatigue.  Shortness of breath fatigue with prior signs for previous episodes of CAD prior to stent placement.  With EKG changes and complaints of chest pain and shortness of breath, weakness, and fatigue, in a diabetic female, she is being scheduled for right and left heart catheterization for concerns of unstable angina.  She has been sent to the medical mall for labs today of CBC, BMP, troponin.  For any critical labs she has been advised she will be called and advised to report to the emergency department for further evaluation.  She has also been advised on ED precautions if she continues to have ongoing chest discomfort.  She has been continued on clopidogrel  75 mg daily, ezetimibe  10 mg daily, Repatha  140 mg injection every 2 weeks and carvedilol .  HFimpEF/stress-induced cardiomyopathy echocardiogram pending.  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  Given recent  increase in life stressors recently worsening dyspnea on exertion updated echocardiogram was ordered.  Previously did not tolerate MRA due to hyperkalemia.  She is continued on furosemide  20 mg 3 times a week.  She has swelling into the bilateral lower extremities that is trace.  No additional medication changes today.  Hypertension with a blood pressure of 130/58 blood pressure has been better controlled on current medication regimen.  She has been continued on amlodipine  10 mg daily, carvedilol  12.5 mg twice daily, furosemide  20 mg 3 times weekly, Imdur  30 mg daily.  She has been encouraged to monitor her pressure 1 to 2 hours postmedication administration at home as well.  Hyperlipidemia with an LDL 44 that remains at goal in 03/2024.  She is continued on statin, ezetimibe , and Repatha .  Peripheral arterial disease status post intervention to the left SFA in 08/2020.  Most recent ABI in LAD 09/2022 was stable.  He will likely need follow-up after cardiovascular workup.  She is continued on clopidogrel  75 mg daily, ezetimibe  10 mg daily, Repatha  140 mg  every 2 weeks.  Intermittent left bundle branch block not noted on EKG today.  Previously noted on EKG at last appointment.  Carotid artery stenosis carotid artery ultrasound 3/24 showed 1 to 39% bilateral internal carotid arteries.   Tobacco abuse with total cessation continued to be recommended.  Type 2 diabetes she continues to be on insulin  therapy.  States her blood sugars have been high and all over the place.  Ongoing management per PCP.  Hypokalemia noted on ED visit where she was supplemented with potassium 10 mEq 3 times weekly.  Sent for updated labs today as recently furosemide  was increased by PCP.    Informed Consent   Shared Decision Making/Informed Consent The risks [stroke (1 in 1000), death (1 in 1000), kidney failure [usually temporary] (1 in 500), bleeding (1 in 200), allergic reaction [possibly serious] (1 in 200)], benefits  (diagnostic support and management of coronary artery disease) and alternatives of a cardiac catheterization were discussed in detail with Ms. Yost and she is willing to proceed.     Dispo: Patient to return to clinic to see MD/APP 2 to 3 weeks postprocedure or sooner if needed for further evaluation.  Signed, Katelinn Justice, NP

## 2024-05-09 NOTE — Patient Instructions (Signed)
 Medication Instructions:  Your physician recommends that you continue on your current medications as directed. Please refer to the Current Medication list given to you today.   *If you need a refill on your cardiac medications before your next appointment, please call your pharmacy*  Lab Work: Your provider would like for you to have following labs drawn today CMP, CBC, Troponin.   If you have labs (blood work) drawn today and your tests are completely normal, you will receive your results only by: MyChart Message (if you have MyChart) OR A paper copy in the mail If you have any lab test that is abnormal or we need to change your treatment, we will call you to review the results.  Testing/Procedures:  [facility] A DEPT OF [facility] [place] [address] [place] [address] Dept: [phone] Loc: [phone]  Sarah Payne  05/09/2024  You are scheduled for a Right and Left Cardiac Catheterization on Monday, October 27 with Dr. Deatrice Cage.  1. Please arrive at the [facility] Entrance of [facility], [address], [place] [address] at 11:30 AM (This is 1 hour(s) prior to your procedure time).  Proceed to the Check-In Desk directly inside the entrance.  Procedure Parking: Use the entrance off of the [facility] [address] side of the hospital. Turn right upon entering and follow the driveway to parking that is directly in front of the [facility] Vascular Center. There is no valet parking available at this entrance, however there is an awning directly in front of the Heart & Vascular Center for drop off/ pick up for patients.  Special note: Every effort is made to have your procedure done on time. Please understand that emergencies sometimes delay scheduled procedures.  2. Diet: Light meals may be eaten up to 6 hours before scheduled procedures from 12N and after; please stop eating at 6 hours  prior to test.   Light meal consist of plain toast, fruit, light soups, crackers.   3. Hydration: You need to be well hydrated before your procedure. On October 27, you may drink approved liquids (see below) until 2 hours before the procedure, with 16 oz of water  as your last intake.   List of approved liquids water , clear juice, clear tea, black coffee, fruit juices, non-citric and without pulp, carbonated beverages, Gatorade, Kool -Aid, plain Jello-O and plain ice popsicles.  4. Labs: You will need to have blood drawn on Monday, October 20 at [facility], Go to 1st desk on your right to register.  Address: [address] [place], [place] [address]  Open: 8am - 5pm  Phone: [phone]. You do not need to be fasting.  5. Medication instructions in preparation for your procedure:   Contrast Allergy: No  Stop taking, potassium the day prior to your test Sunday, October 26,  Take only 1/2 dose units of insulin  the night before your procedure. Do not take any insulin  on the day of the procedure.  Do not take Diabetes Med containing metformin  on the day of the procedure and HOLD 48 HOURS AFTER THE PROCEDURE.  On the morning of your procedure, take your any morning medicines NOT listed above.  You may use sips of water .  6. Plan to go home the same day, you will only stay overnight if medically necessary. 7. Bring a current list of your medications and current insurance cards. 8. You MUST have a responsible  person to drive you home. 9. Someone MUST be with you the first 24 hours after you arrive home or your discharge will be delayed. 10. Please wear clothes that are easy to get on and off and wear slip-on shoes.  Thank you for allowing us  to care for you!   -- [facility]   Follow-Up: At [facility], you and your health needs are our priority.  As part of our continuing mission to provide you with exceptional heart care, our  providers are all part of one team.  This team includes your primary Cardiologist (physician) and Advanced Practice Providers or APPs (Physician Assistants and Nurse Practitioners) who all work together to provide you with the care you need, when you need it.  Your next appointment:   3 week(s)  Provider:   You may see Timothy Gollan, MD or one of the following Advanced Practice Providers on your designated Care Team:   Lonni Meager, NP Lesley Maffucci, PA-C Bernardino Bring, PA-C Cadence Lewisburg, PA-C Tylene Lunch, NP Barnie Hila, NP   We recommend signing up for the patient portal called MyChart.  Sign up information is provided on this After Visit Summary.  MyChart is used to connect with patients for Virtual Visits (Telemedicine).  Patients are able to view lab/test results, encounter notes, upcoming appointments, etc.  Non-urgent messages can be sent to your provider as well.   To learn more about what you can do with MyChart, go to [email or web address].

## 2024-05-10 ENCOUNTER — Ambulatory Visit: Payer: Self-pay | Admitting: Cardiology

## 2024-05-10 NOTE — Progress Notes (Signed)
 Kidney function has improved potassium is stable and high-sensitivity troponin is negative.  Continue with current medication regimen as previously prescribed.

## 2024-05-16 ENCOUNTER — Encounter: Payer: Self-pay | Admitting: Cardiovascular Disease

## 2024-05-16 ENCOUNTER — Ambulatory Visit
Admission: RE | Admit: 2024-05-16 | Discharge: 2024-05-16 | Disposition: A | Discharge status: Home / Self Care | Attending: Cardiovascular Disease | Admitting: Cardiovascular Disease

## 2024-05-16 ENCOUNTER — Other Ambulatory Visit: Payer: Self-pay

## 2024-05-16 ENCOUNTER — Encounter
Admission: RE | Disposition: A | Discharge status: Home / Self Care | Payer: Self-pay | Source: Home / Self Care | Attending: Cardiovascular Disease

## 2024-05-16 DIAGNOSIS — E785 Hyperlipidemia, unspecified: Secondary | ICD-10-CM | POA: Insufficient documentation

## 2024-05-16 DIAGNOSIS — R072 Precordial pain: Secondary | ICD-10-CM

## 2024-05-16 DIAGNOSIS — I5032 Chronic diastolic (congestive) heart failure: Secondary | ICD-10-CM | POA: Diagnosis not present

## 2024-05-16 DIAGNOSIS — Z955 Presence of coronary angioplasty implant and graft: Secondary | ICD-10-CM | POA: Insufficient documentation

## 2024-05-16 DIAGNOSIS — I6523 Occlusion and stenosis of bilateral carotid arteries: Secondary | ICD-10-CM | POA: Diagnosis not present

## 2024-05-16 DIAGNOSIS — Z794 Long term (current) use of insulin: Secondary | ICD-10-CM | POA: Diagnosis not present

## 2024-05-16 DIAGNOSIS — I2510 Atherosclerotic heart disease of native coronary artery without angina pectoris: Secondary | ICD-10-CM | POA: Diagnosis not present

## 2024-05-16 DIAGNOSIS — I447 Left bundle-branch block, unspecified: Secondary | ICD-10-CM | POA: Insufficient documentation

## 2024-05-16 DIAGNOSIS — E1151 Type 2 diabetes mellitus with diabetic peripheral angiopathy without gangrene: Secondary | ICD-10-CM | POA: Diagnosis not present

## 2024-05-16 DIAGNOSIS — R079 Chest pain, unspecified: Secondary | ICD-10-CM | POA: Diagnosis present

## 2024-05-16 DIAGNOSIS — E039 Hypothyroidism, unspecified: Secondary | ICD-10-CM | POA: Insufficient documentation

## 2024-05-16 DIAGNOSIS — I25118 Atherosclerotic heart disease of native coronary artery with other forms of angina pectoris: Secondary | ICD-10-CM | POA: Diagnosis not present

## 2024-05-16 DIAGNOSIS — Z7902 Long term (current) use of antithrombotics/antiplatelets: Secondary | ICD-10-CM | POA: Diagnosis not present

## 2024-05-16 DIAGNOSIS — E1122 Type 2 diabetes mellitus with diabetic chronic kidney disease: Secondary | ICD-10-CM | POA: Diagnosis not present

## 2024-05-16 DIAGNOSIS — Z79899 Other long term (current) drug therapy: Secondary | ICD-10-CM | POA: Insufficient documentation

## 2024-05-16 DIAGNOSIS — G4730 Sleep apnea, unspecified: Secondary | ICD-10-CM | POA: Insufficient documentation

## 2024-05-16 DIAGNOSIS — E876 Hypokalemia: Secondary | ICD-10-CM | POA: Diagnosis not present

## 2024-05-16 DIAGNOSIS — N1832 Chronic kidney disease, stage 3b: Secondary | ICD-10-CM | POA: Diagnosis not present

## 2024-05-16 DIAGNOSIS — Z720 Tobacco use: Secondary | ICD-10-CM | POA: Insufficient documentation

## 2024-05-16 DIAGNOSIS — J449 Chronic obstructive pulmonary disease, unspecified: Secondary | ICD-10-CM | POA: Insufficient documentation

## 2024-05-16 DIAGNOSIS — I130 Hypertensive heart and chronic kidney disease with heart failure and stage 1 through stage 4 chronic kidney disease, or unspecified chronic kidney disease: Secondary | ICD-10-CM | POA: Insufficient documentation

## 2024-05-16 HISTORY — PX: RIGHT/LEFT HEART CATH AND CORONARY ANGIOGRAPHY: CATH118266

## 2024-05-16 LAB — POCT I-STAT EG7
Acid-Base Excess: 1 mmol/L (ref 0.0–2.0)
Bicarbonate: 26.9 mmol/L (ref 20.0–28.0)
Calcium, Ion: 1.18 mmol/L (ref 1.15–1.40)
HCT: 37 % (ref 36.0–46.0)
Hemoglobin: 12.6 g/dL (ref 12.0–15.0)
O2 Saturation: 70 %
Potassium: 3.2 mmol/L — ABNORMAL LOW (ref 3.5–5.1)
Sodium: 139 mmol/L (ref 135–145)
TCO2: 28 mmol/L (ref 22–32)
pCO2, Ven: 45.6 mmHg (ref 44–60)
pH, Ven: 7.378 (ref 7.25–7.43)
pO2, Ven: 38 mmHg (ref 32–45)

## 2024-05-16 LAB — POCT I-STAT 7, (LYTES, BLD GAS, ICA,H+H)
Acid-Base Excess: 1 mmol/L (ref 0.0–2.0)
Bicarbonate: 26.2 mmol/L (ref 20.0–28.0)
Calcium, Ion: 1.18 mmol/L (ref 1.15–1.40)
HCT: 36 % (ref 36.0–46.0)
Hemoglobin: 12.2 g/dL (ref 12.0–15.0)
O2 Saturation: 94 %
Potassium: 3.3 mmol/L — ABNORMAL LOW (ref 3.5–5.1)
Sodium: 138 mmol/L (ref 135–145)
TCO2: 28 mmol/L (ref 22–32)
pCO2 arterial: 42.7 mmHg (ref 32–48)
pH, Arterial: 7.397 (ref 7.35–7.45)
pO2, Arterial: 70 mmHg — ABNORMAL LOW (ref 83–108)

## 2024-05-16 SURGERY — RIGHT/LEFT HEART CATH AND CORONARY ANGIOGRAPHY
Anesthesia: Moderate Sedation | Laterality: Bilateral

## 2024-05-16 MED ORDER — SODIUM CHLORIDE 0.9% FLUSH: 3.0000 mL | INTRAVENOUS | Status: DC | PRN

## 2024-05-16 MED ORDER — IOHEXOL 300 MG/ML  SOLN
INTRAMUSCULAR | Status: DC | PRN
  Administered 2024-05-16: 64 mL

## 2024-05-16 MED ORDER — VERAPAMIL HCL 2.5 MG/ML IV SOLN
INTRAVENOUS | Status: AC
  Filled 2024-05-16: qty 2

## 2024-05-16 MED ORDER — ASPIRIN 81 MG PO TBEC: 81.0000 mg | DELAYED_RELEASE_TABLET | Freq: Every day | ORAL | Status: AC

## 2024-05-16 MED ORDER — LIDOCAINE HCL 1 % IJ SOLN
INTRAMUSCULAR | Status: AC
  Filled 2024-05-16: qty 20

## 2024-05-16 MED ORDER — HEPARIN SODIUM (PORCINE) 1000 UNIT/ML IJ SOLN
INTRAMUSCULAR | Status: AC
  Filled 2024-05-16: qty 10

## 2024-05-16 MED ORDER — VERAPAMIL HCL 2.5 MG/ML IV SOLN
INTRAVENOUS | Status: DC | PRN
  Administered 2024-05-16 (×2): 2.5 mg via INTRAVENOUS

## 2024-05-16 MED ORDER — ASPIRIN 81 MG PO CHEW
81.0000 mg | CHEWABLE_TABLET | ORAL | Status: AC
  Administered 2024-05-16: 81 mg via ORAL

## 2024-05-16 MED ORDER — SODIUM CHLORIDE 0.9 % IV SOLN
INTRAVENOUS | Status: DC
Start: 2024-05-16 — End: 2024-05-16

## 2024-05-16 MED ORDER — NITROGLYCERIN 0.4 MG SL SUBL: 0.4000 mg | SUBLINGUAL_TABLET | SUBLINGUAL | 3 refills | Status: AC | PRN

## 2024-05-16 MED ORDER — FENTANYL CITRATE (PF) 100 MCG/2ML IJ SOLN
INTRAMUSCULAR | Status: DC | PRN
  Administered 2024-05-16: 25 ug via INTRAVENOUS

## 2024-05-16 MED ORDER — FENTANYL CITRATE (PF) 100 MCG/2ML IJ SOLN
INTRAMUSCULAR | Status: AC
  Filled 2024-05-16: qty 2

## 2024-05-16 MED ORDER — LIDOCAINE HCL (PF) 1 % IJ SOLN
INTRAMUSCULAR | Status: DC | PRN
  Administered 2024-05-16: 2 mL
  Administered 2024-05-16: 5 mL

## 2024-05-16 MED ORDER — SODIUM CHLORIDE 0.9 % IV SOLN: 250.0000 mL | INTRAVENOUS | Status: DC | PRN

## 2024-05-16 MED ORDER — HEPARIN SODIUM (PORCINE) 1000 UNIT/ML IJ SOLN
INTRAMUSCULAR | Status: DC | PRN
  Administered 2024-05-16: 3000 [IU] via INTRAVENOUS

## 2024-05-16 MED ORDER — ONDANSETRON HCL 4 MG/2ML IJ SOLN: 4.0000 mg | Freq: Four times a day (QID) | INTRAMUSCULAR | Status: DC | PRN

## 2024-05-16 MED ORDER — SODIUM CHLORIDE 0.9% FLUSH: 3.0000 mL | Freq: Two times a day (BID) | INTRAVENOUS | Status: DC

## 2024-05-16 MED ORDER — MIDAZOLAM HCL 2 MG/2ML IJ SOLN
INTRAMUSCULAR | Status: AC
  Filled 2024-05-16: qty 2

## 2024-05-16 MED ORDER — ACETAMINOPHEN 325 MG PO TABS: 650.0000 mg | ORAL_TABLET | ORAL | Status: DC | PRN

## 2024-05-16 MED ORDER — MIDAZOLAM HCL (PF) 2 MG/2ML IJ SOLN
INTRAMUSCULAR | Status: DC | PRN
  Administered 2024-05-16: 1 mg via INTRAVENOUS

## 2024-05-16 MED ORDER — HEPARIN (PORCINE) IN NACL 1000-0.9 UT/500ML-% IV SOLN
INTRAVENOUS | Status: AC
  Filled 2024-05-16: qty 500

## 2024-05-16 MED ORDER — SODIUM CHLORIDE 0.9 % IV SOLN: INTRAVENOUS | Status: DC

## 2024-05-16 MED ORDER — HEPARIN (PORCINE) IN NACL 2000-0.9 UNIT/L-% IV SOLN
INTRAVENOUS | Status: DC | PRN
  Administered 2024-05-16: 1000 mL

## 2024-05-16 MED ORDER — ASPIRIN 81 MG PO CHEW
CHEWABLE_TABLET | ORAL | Status: AC
  Filled 2024-05-16: qty 1

## 2024-05-16 SURGICAL SUPPLY — 11 items
CATH BALLN WEDGE 5F 110CM (CATHETERS) IMPLANT
CATH INFINITI 5 FR JL3.5 (CATHETERS) IMPLANT
CATH INFINITI AMBI 5FR JK (CATHETERS) IMPLANT
DEVICE RAD TR BAND REGULAR (VASCULAR PRODUCTS) IMPLANT
DRAPE BRACHIAL (DRAPES) IMPLANT
GUIDEWIRE .025 260CM (WIRE) IMPLANT
GUIDEWIRE INQWIRE 1.5J.035X260 (WIRE) IMPLANT
PACK CARDIAC CATH (CUSTOM PROCEDURE TRAY) ×1 IMPLANT
SET ATX-X65L (MISCELLANEOUS) IMPLANT
SHEATH GLIDE SLENDER 4/5FR (SHEATH) IMPLANT
STATION PROTECTION PRESSURIZED (MISCELLANEOUS) IMPLANT

## 2024-05-16 NOTE — Interval H&P Note (Signed)
 History and Physical Interval Note:  05/16/2024 11:20 AM  Sarah Payne  has presented today for surgery, with the diagnosis of R and L Cath    Chest pain.  The various methods of treatment have been discussed with the patient and family. After consideration of risks, benefits and other options for treatment, the patient has consented to  Procedure(s): RIGHT/LEFT HEART CATH AND CORONARY ANGIOGRAPHY (Bilateral) as a surgical intervention.  The patient's history has been reviewed, patient examined, no change in status, stable for surgery.  I have reviewed the patient's chart and labs.  Questions were answered to the patient's satisfaction.     Orra Nolde

## 2024-05-17 ENCOUNTER — Other Ambulatory Visit: Payer: Self-pay

## 2024-05-17 ENCOUNTER — Telehealth: Payer: Self-pay

## 2024-05-17 DIAGNOSIS — R0602 Shortness of breath: Secondary | ICD-10-CM

## 2024-05-17 NOTE — Telephone Encounter (Signed)
 Copied from CRM [id number]. Topic: General - Other >> May 17, 2024 10:32 AM Dedra B wrote: Reason for CRM: Pt wanted to let Comer Gaskins know that she has to have open heart surgery.

## 2024-05-18 ENCOUNTER — Other Ambulatory Visit

## 2024-05-18 NOTE — Telephone Encounter (Signed)
 Noted and I'm sorry to hear this! Will await cardiology notes.

## 2024-05-24 ENCOUNTER — Other Ambulatory Visit: Payer: Self-pay | Admitting: Primary Care

## 2024-05-24 DIAGNOSIS — F17200 Nicotine dependence, unspecified, uncomplicated: Secondary | ICD-10-CM | POA: Diagnosis not present

## 2024-05-24 DIAGNOSIS — I25119 Atherosclerotic heart disease of native coronary artery with unspecified angina pectoris: Secondary | ICD-10-CM | POA: Diagnosis not present

## 2024-05-24 DIAGNOSIS — G8929 Other chronic pain: Secondary | ICD-10-CM

## 2024-05-24 DIAGNOSIS — Z01818 Encounter for other preprocedural examination: Secondary | ICD-10-CM | POA: Diagnosis not present

## 2024-05-24 DIAGNOSIS — Z951 Presence of aortocoronary bypass graft: Secondary | ICD-10-CM | POA: Diagnosis not present

## 2024-05-25 ENCOUNTER — Encounter: Payer: Self-pay | Admitting: Student in an Organized Health Care Education/Training Program

## 2024-05-25 ENCOUNTER — Ambulatory Visit

## 2024-05-25 ENCOUNTER — Other Ambulatory Visit: Payer: Self-pay | Admitting: Primary Care

## 2024-05-25 ENCOUNTER — Ambulatory Visit (INDEPENDENT_AMBULATORY_CARE_PROVIDER_SITE_OTHER): Admitting: Student in an Organized Health Care Education/Training Program

## 2024-05-25 VITALS — BP 130/78 | HR 84 | Temp 97.6°F | Ht 66.0 in | Wt 129.2 lb

## 2024-05-25 DIAGNOSIS — J449 Chronic obstructive pulmonary disease, unspecified: Secondary | ICD-10-CM | POA: Diagnosis not present

## 2024-05-25 DIAGNOSIS — R0602 Shortness of breath: Secondary | ICD-10-CM

## 2024-05-25 DIAGNOSIS — Z01818 Encounter for other preprocedural examination: Secondary | ICD-10-CM

## 2024-05-25 DIAGNOSIS — Z01811 Encounter for preprocedural respiratory examination: Secondary | ICD-10-CM

## 2024-05-25 DIAGNOSIS — F17210 Nicotine dependence, cigarettes, uncomplicated: Secondary | ICD-10-CM

## 2024-05-25 DIAGNOSIS — G8929 Other chronic pain: Secondary | ICD-10-CM

## 2024-05-25 DIAGNOSIS — F17200 Nicotine dependence, unspecified, uncomplicated: Secondary | ICD-10-CM

## 2024-05-25 LAB — PULMONARY FUNCTION TEST
DL/VA % pred: 104 %
DL/VA: 4.28 ml/min/mmHg/L
DLCO unc % pred: 84 %
DLCO unc: 16.98 ml/min/mmHg
FEF 25-75 Post: 1.65 L/s
FEF 25-75 Pre: 0.94 L/s
FEF2575-%Change-Post: 75 %
FEF2575-%Pred-Post: 95 %
FEF2575-%Pred-Pre: 54 %
FEV1-%Change-Post: 18 %
FEV1-%Pred-Post: 86 %
FEV1-%Pred-Pre: 73 %
FEV1-Post: 1.96 L
FEV1-Pre: 1.66 L
FEV1FVC-%Change-Post: 16 %
FEV1FVC-%Pred-Pre: 86 %
FEV6-%Change-Post: 0 %
FEV6-%Pred-Post: 87 %
FEV6-%Pred-Pre: 88 %
FEV6-Post: 2.52 L
FEV6-Pre: 2.53 L
FEV6FVC-%Change-Post: 0 %
FEV6FVC-%Pred-Post: 105 %
FEV6FVC-%Pred-Pre: 104 %
FVC-%Change-Post: 1 %
FVC-%Pred-Post: 85 %
FVC-%Pred-Pre: 85 %
FVC-Post: 2.6 L
FVC-Pre: 2.57 L
Post FEV1/FVC ratio: 75 %
Post FEV6/FVC ratio: 100 %
Pre FEV1/FVC ratio: 65 %
Pre FEV6/FVC Ratio: 100 %
RV % pred: 37 %
RV: 0.9 L
TLC % pred: 78 %
TLC: 4.22 L

## 2024-05-25 MED ORDER — NICOTINE 14 MG/24HR TD PT24: 14.0000 mg | MEDICATED_PATCH | TRANSDERMAL | 0 refills | Status: AC

## 2024-05-25 MED ORDER — NICOTINE POLACRILEX 2 MG MT LOZG: 2.0000 mg | LOZENGE | OROMUCOSAL | 3 refills | Status: DC | PRN

## 2024-05-25 MED ORDER — NICOTINE 21 MG/24HR TD PT24: 21.0000 mg | MEDICATED_PATCH | TRANSDERMAL | 0 refills | Status: AC

## 2024-05-25 MED ORDER — UMECLIDINIUM-VILANTEROL 62.5-25 MCG/ACT IN AEPB: 1.0000 | INHALATION_SPRAY | Freq: Every day | RESPIRATORY_TRACT | 11 refills | Status: AC

## 2024-05-25 MED ORDER — NICOTINE 7 MG/24HR TD PT24: 7.0000 mg | MEDICATED_PATCH | TRANSDERMAL | 0 refills | Status: AC

## 2024-05-25 NOTE — Patient Instructions (Addendum)
 VISIT SUMMARY: Today, you were seen for a preoperative risk assessment due to your shortness of breath and history of coronary artery disease. We discussed your chronic obstructive pulmonary disease (COPD), your long history of smoking, and the upcoming coronary artery bypass grafting (CABG) surgery. We reviewed your pulmonary function tests and made adjustments to your medications to help manage your symptoms and improve your overall health before surgery.  YOUR PLAN: -CHRONIC OBSTRUCTIVE PULMONARY DISEASE (COPD): COPD is a lung condition that causes chronic dry cough and shortness of breath. Your pulmonary function tests show airway obstruction consistent with COPD, and you have smoking-related lung damage. We have prescribed an Anoro inhaler, which you should use one puff once daily. This inhaler will help manage your COPD symptoms. Please follow the instructions on how to use the inhaler and understand its benefits. It is also important to continue working on quitting smoking to improve your lung health.  -NICOTINE  DEPENDENCE AND SMOKING CESSATION: You have a long history of smoking, which has contributed to your lung condition. Quitting smoking is crucial for your upcoming surgery and overall health. We discussed using nicotine  replacement therapy, including patches and lozenges. You will start with a 21 mg nicotine  patch for 6 weeks, then reduce to 14 mg for 2 weeks, and finally 7 mg for 2 weeks. Nicotine  lozenges can be used as needed for cravings. Be aware of potential side effects like vivid dreams and skin irritation. Proper application of the patch is important, and you should contact the pharmacy if there are any issues with your inhaler prescription.  -PREOPERATIVE PULMONARY RISK ASSESSMENT FOR CABG: You are undergoing a preoperative pulmonary risk assessment for your planned coronary artery bypass grafting (CABG) surgery in December. This assessment is necessary due to your history of  coronary artery disease and recent findings of multiple blockages. Your pulmonary function tests indicate that you are fit for surgery, but it is essential to continue with smoking cessation and use your inhaler as prescribed. Ensure that all preoperative requirements, including an echocardiogram and smoking cessation, are completed.  INSTRUCTIONS: Please follow up with us  to ensure all preoperative requirements are met, including completing your echocardiogram and continuing with smoking cessation. If you have any issues with your medications or need further assistance, do not hesitate to contact our office.          The [place]  Quitline: Call 1-800-QUIT-NOW (505-722-4043). The [place] Quitline is a free service for [place]  residents. Trained counselors are available from 8 am until 3 am, 365 days per year. Services are available in both English and Spanish.   Web Resources Free online support programs can help you track your progress and share experiences with others who are quitting. These are examples: www.becomeanex.org www.trytostop.org  www.smokefree.gov  www.https://www.vargas.com/.aspx   Tobacco Cessation Medications  Nicotine  Replacement Therapy (NRT)  Nicotine  is the addictive part of tobacco smoke, but not the most dangerous part. There are 7000 other toxins in cigarettes, including carbon monoxide, that cause disease. People do not generally become addicted to medication. Common problems: People don't use enough medication or stop too early. Medications are safe and effective. Overdose is very uncommon. Use medications as long as needed (3 months minimum). Some combinations work better than single medications. Long acting medications like the NRT patch and bupropion provide continuous treatment for withdrawal symptoms.  PLUS  Short acting medications like the NRT gum, lozenge, inhaler, and nasal spray help people to cope with  breakthrough cravings.  ? Nicotine  Patch  Place patch on hairless skin on upper body, including arms and back. Each day: discard old patch, shower, apply new patch to a different site. Apply hydrocortisone cream to mildly red/irritated areas. Call provider if rash develops. If patch causes sleep disturbance, remove patch at bedtime and replace each morning after shower. Side effects may include: skin irritation, headache, insomnia, abnormal/vivid dreams.  ? Nicotine  Gum  Chew gum slowly, park in cheek when peppery taste or tingling sensation begins (about 15-30 chews). When taste or tingling goes away, begin chewing again. Use until nicotine  is gone (taste or tingle does not return, usually 30 minutes). Park in different areas of mouth. Nicotine  is absorbed through the lining of the mouth. Use enough to control cravings, up to 24 pieces per day (if used alone). Avoid eating or drinking for 15 minutes before using and during use. Side effects may include: mouth/jaw soreness, hiccups, indigestion, hypersalivation.  If gum is not chewed correctly, additional side effects may include lightheadedness, nausea/vomiting, throat and mouth irritation.  ? Nicotine  Lozenge  Allow to dissolve slowly in mouth (20-30 minutes). Do not chew or swallow. Nicotine  release may cause a warm tingling sensation. Occasionally rotate to different areas of the mouth. Use enough to control cravings, up to 20 lozenges per day (if used alone). Avoid eating or drinking for 15 minutes before using and during use. Side effects may include: nausea, hiccups, cough, heartburn, headache, gas, insomnia.  ? Nicotine  Nasal Spray Use 1 spray in each nostril (1 dose) and tilt head back for 1 minute. Do not sniff, swallow, or inhale through nose.  Use at least 8 doses (1 spray in each nostril) , up to 40 doses per day (if used alone). To reduce nasal irritation, spray on cotton swab and insert into nose. Side effects may  include: nasal and/or throat irritation (hot, peppery, or burning sensation), nasal irritation, tearing, sneezing, cough, headache.  ? Nicotine  Oral Inhaler (puffer) Inhale into the back of the throat or puff in short breaths. Do not inhale into the lungs.  Puff continuously for 20 minutes (about 80 puffs) until cartridge is empty. Change cartridge when it loses the "burning in throat" sensation (feels like air only). Open cartridges can be saved and used again within 24 hours. Use at least 6 and up to 16 cartridges per day (if used alone).  Avoid eating or drinking for 15 minutes before using and during use. Side effects may include: mouth and/or throat irritation, unpleasant taste, cough, nasal irritation, indigestion, hiccups, headache.  ? Chantix  (varenicline ) Days 1-3: Take one 0.5 mg white pill each morning for 3 days, one week before quit date. Days 4-7: Increase to one 0.5 mg white pill twice a day in morning and evening for 4 days.  On Day 8 (target quit date), increase to one 1 mg blue pill twice a day. Maintain this dose for a minimum of 3 months. Take with food and a full glass of water  to reduce nausea. Be sure that the two doses are at least 8 hours apart, but try to take second dose early in the evening (i.e. 6 pm) to avoid sleep problems. Common side effects include: nausea, insomnia, headache, abnormal/vivid dreams. Tell your doctor if you have any history of psychiatric illness prior to starting Chantix .  STOP taking CHANTIX  and contact a healthcare provider immediately if you experience agitation, hostility, depressed mood, changes in thoughts or behavior that are not typical for you, thinking about or attempting suicide, allergic or skin reactions  including swelling, rash, redness, or peeling of the skin.  For patients who have heart disease: Smoking is a major risk factor for cardiovascular disease, and Chantix  can help you quit smoking. Chantix  may be associated with a  small, increased risk of certain heart events in patients who have heart disease. If you have any new or worsening symptoms of heart disease while taking Chantix , such as shortness of breath or trouble breathing, new or worsening chest pain, or new or worsening pain in your legs when walking, call your doctor or get emergency medical help immediately.  ? Wellbutrin / Zyban (bupropion) Take one 150 mg pill each morning for 3 days, one week before target quit date. On Day 4, increase to one 150 mg pill twice a day, morning and evening.  Maintain this dose for a minimum of 3 months. Be sure that the two doses are at least 8 hours apart, but try to take second dose early in the evening (i.e. 6 pm) to avoid sleep problems. Avoid or minimize use of alcohol when taking this medication. Common side effects include: dry mouth, headache, insomnia, nausea, weight loss.  Risk of seizure is 07/998. STOP taking BUPROPION and contact a healthcare provider immediately if you experience agitation, hostility, depressed mood, changes in thoughts or behavior that are not typical for you, thinking about or attempting suicide, allergic or skin reactions including swelling, rash, redness, or peeling of the skin.

## 2024-05-25 NOTE — Telephone Encounter (Unsigned)
 Copied from CRM #[id number]. Topic: Clinical - Medication Refill >> May 25, 2024  8:31 AM Rea ORN wrote: Medication:  gabapentin  (NEURONTIN ) 300 MG capsule  *Pt needs to pick this rx up today. She is having open heart surgery at the end of the month  Has the patient contacted their pharmacy? Yes (Agent: If no, request that the patient contact the pharmacy for the refill. If patient does not wish to contact the pharmacy document the reason why and proceed with request.) (Agent: If yes, when and what did the pharmacy advise?)  This is the patient's preferred pharmacy:  [facility] [address] [place] - [address] [place] [address] Phone: [phone] Fax: [phone]    Is this the correct pharmacy for this prescription?  Yes If no, delete pharmacy and type the correct one.   Has the prescription been filled recently? No  Is the patient out of the medication? Yes  Has the patient been seen for an appointment in the last year OR does the patient have an upcoming appointment? Yes  Can we respond through MyChart? No  Agent: Please be advised that Rx refills may take up to 3 business days. We ask that you follow-up with your pharmacy.

## 2024-05-25 NOTE — Progress Notes (Unsigned)
 Synopsis: Referred in *** by Sarah Payne POUR, NP  Assessment & Plan:   1. Chronic obstructive pulmonary disease, unspecified COPD type (HCC) (Primary)    COPD is characterized by a chronic dry cough and shortness of breath. Pulmonary function tests show airway obstruction consistent with COPD, with significant improvement post-albuterol . There is mildly decreased lung volume and reduced oxygen extraction, consistent with emphysema and smoking-related damage. No wheezing is present on examination. Albuterol  provides symptomatic relief but is short-acting. Prescribe Anoro inhaler, one puff once daily. Educate on inhaler use and its benefits for COPD management. Advise on the importance of smoking cessation for long-term lung health.  Nicotine  dependence, active tobacco use, and smoking cessation management    She has long-standing nicotine  dependence with a 58-year smoking history, currently reduced to one cigarette per day. Smoking cessation is critical for surgical eligibility and overall health improvement. Discuss nicotine  replacement therapy with patches and lozenges. Inform about potential side effects of nicotine  patches, including vivid dreams. Prescribe a 21 mg nicotine  patch for 6 weeks, then 14 mg for 2 weeks, and 7 mg for 2 weeks. Prescribe nicotine  lozenges for use as needed for cravings. Educate on proper patch application and potential side effects, including skin irritation and vivid dreams. Advise contacting the pharmacy for alternative inhalers if Anoro is not covered by insurance.  Preoperative pulmonary risk assessment and optimization for planned coronary artery bypass grafting (CABG)    Preoperative pulmonary risk assessment is conducted for CABG due to multivessel coronary artery disease and prior stents. Recent cardiac catheterization revealed multiple blockages, and CABG is planned for December. Pulmonary function tests and evaluation indicate no contraindications for  surgery. Smoking cessation is essential for surgical clearance. Continue with preoperative optimization, including smoking cessation and inhaler use. Ensure completion of all preoperative requirements, including echocardiogram and smoking cessation.       - umeclidinium-vilanterol (ANORO ELLIPTA ) 62.5-25 MCG/ACT AEPB; Inhale 1 puff into the lungs daily.  Dispense: 30 each; Refill: 11  2. Tobacco use disorder *** - nicotine  (NICODERM CQ  - DOSED IN MG/24 HOURS) 21 mg/24hr patch; Place 1 patch (21 mg total) onto the skin daily.  Dispense: 42 patch; Refill: 0 - nicotine  (NICODERM CQ  - DOSED IN MG/24 HOURS) 14 mg/24hr patch; Place 1 patch (14 mg total) onto the skin daily for 14 days.  Dispense: 14 patch; Refill: 0 - nicotine  (NICODERM CQ  - DOSED IN MG/24 HR) 7 mg/24hr patch; Place 1 patch (7 mg total) onto the skin daily for 14 days.  Dispense: 14 patch; Refill: 0 - nicotine  polacrilex (NICOTINE  MINI) 2 MG lozenge; Take 1 lozenge (2 mg total) by mouth every 2 (two) hours as needed for smoking cessation.  Dispense: 72 lozenge; Refill: 3   Return in about 3 months (around 08/25/2024).  Sarah November, MD [facility] 05/25/2024 2:39 PM   I spent *** minutes caring for this patient today, including {EM billing:28027}  End of visit medications:  Meds ordered this encounter  Medications   umeclidinium-vilanterol (ANORO ELLIPTA ) 62.5-25 MCG/ACT AEPB    Sig: Inhale 1 puff into the lungs daily.    Dispense:  30 each    Refill:  11   nicotine  (NICODERM CQ  - DOSED IN MG/24 HOURS) 21 mg/24hr patch    Sig: Place 1 patch (21 mg total) onto the skin daily.    Dispense:  42 patch    Refill:  0   nicotine  (NICODERM CQ  - DOSED IN MG/24 HOURS) 14 mg/24hr patch  Sig: Place 1 patch (14 mg total) onto the skin daily for 14 days.    Dispense:  14 patch    Refill:  0   nicotine  (NICODERM CQ  - DOSED IN MG/24 HR) 7 mg/24hr patch    Sig: Place 1 patch (7 mg total) onto the skin daily for 14  days.    Dispense:  14 patch    Refill:  0   nicotine  polacrilex (NICOTINE  MINI) 2 MG lozenge    Sig: Take 1 lozenge (2 mg total) by mouth every 2 (two) hours as needed for smoking cessation.    Dispense:  72 lozenge    Refill:  3     Current Outpatient Medications:    acetaminophen  (TYLENOL ) 650 MG CR tablet, Take 1,300 mg by mouth daily. Takes with Gabapentin , Disp: , Rfl:    albuterol  (VENTOLIN  HFA) 108 (90 Base) MCG/ACT inhaler, Inhale 1-2 puffs into the lungs every 6 (six) hours as needed for wheezing or shortness of breath., Disp: , Rfl:    amLODipine  (NORVASC ) 10 MG tablet, Take 1 tablet (10 mg total) by mouth daily., Disp: 90 tablet, Rfl: 0   aspirin  EC 81 MG tablet, Take 1 tablet (81 mg total) by mouth daily. Swallow whole., Disp: , Rfl:    atorvastatin  (LIPITOR ) 80 MG tablet, Take 1 tablet (80 mg total) by mouth daily. for cholesterol., Disp: 90 tablet, Rfl: 2   clobetasol  cream (TEMOVATE ) 0.05 %, Apply 1 Application topically 2 (two) times daily as needed. For vaginal itching., Disp: 30 g, Rfl: 0   Continuous Glucose Receiver (FREESTYLE LIBRE 2 READER) DEVI, Use with sensor to check blood sugars 6 times daily., Disp: 1 each, Rfl: 0   Continuous Glucose Sensor (FREESTYLE LIBRE 2 SENSOR) MISC, USE TO CHECK BLOOD SUGAR. CHANGE EVERY 14 DAYS, Disp: 6 each, Rfl: 0   ezetimibe  (ZETIA ) 10 MG tablet, Take 1 tablet (10 mg total) by mouth daily. For cholesterol., Disp: 90 tablet, Rfl: 2   furosemide  (LASIX ) 20 MG tablet, Take 1 tablet (20 mg total) by mouth daily as needed (As needed for weight gain greater than 3 pounds overnight or increased shortness of breath.). Take one tab only on Monday, Wednesday, & Fridays (Patient taking differently: Take 20 mg by mouth every Monday, Wednesday, and Friday. Take one tab only on Monday, Wednesday, & Fridays), Disp: 36 tablet, Rfl: 3   gabapentin  (NEURONTIN ) 300 MG capsule, Take 1 capsule (300 mg total) by mouth daily. May take an additional dose  throughout the day if needed for pain, Disp: 180 capsule, Rfl: 2   Glucagon  (GVOKE HYPOPEN  2-PACK) 1 MG/0.2ML SOAJ, Inject 1 mg (1 pen) as needed for severe low blood sugar (sustained glucose less than 55 despite oral glucose treatments). May repeat in 15 minutes as needed., Disp: 0.4 mL, Rfl: 1   insulin  aspart (NOVOLOG  FLEXPEN) 100 UNIT/ML FlexPen, INJECT 6-16 UNITS SUBCUTANEOUSLY THREE TIMES DAILY WITH MEALS PER  SLIDING  SCALE  FOR  DIABETES, Disp: 45 mL, Rfl: 0   insulin  degludec (TRESIBA  FLEXTOUCH) 100 UNIT/ML FlexTouch Pen, Inject 10 Units into the skin daily., Disp: , Rfl:    Insulin  Pen Needle (INSUPEN PEN NEEDLES) 32G X 4 MM MISC, Use to inject insulin  4 times daily., Disp: 250 each, Rfl: 1   isosorbide  mononitrate (IMDUR ) 30 MG 24 hr tablet, Take 1 tablet (30 mg total) by mouth daily., Disp: 90 tablet, Rfl: 3   levothyroxine  (SYNTHROID ) 100 MCG tablet, TAKE 1 TABLET BY MOUTH IN THE MORNING  ON  AN  EMPTY  STOMACH  WITH  WATER   ONLY.  NO  FOOD  OR  OTHER  MEDICATIONS  FOR  30  MINUTES, Disp: 90 tablet, Rfl: 2   mirtazapine  (REMERON ) 15 MG tablet, Take 1 tablet (15 mg total) by mouth at bedtime. For sleep (Patient taking differently: Take 15 mg by mouth at bedtime as needed (sleep).), Disp: 90 tablet, Rfl: 2   nicotine  (NICODERM CQ  - DOSED IN MG/24 HOURS) 14 mg/24hr patch, Place 1 patch (14 mg total) onto the skin daily for 14 days., Disp: 14 patch, Rfl: 0   nicotine  (NICODERM CQ  - DOSED IN MG/24 HOURS) 21 mg/24hr patch, Place 1 patch (21 mg total) onto the skin daily., Disp: 42 patch, Rfl: 0   nicotine  (NICODERM CQ  - DOSED IN MG/24 HR) 7 mg/24hr patch, Place 1 patch (7 mg total) onto the skin daily for 14 days., Disp: 14 patch, Rfl: 0   nicotine  polacrilex (NICOTINE  MINI) 2 MG lozenge, Take 1 lozenge (2 mg total) by mouth every 2 (two) hours as needed for smoking cessation., Disp: 72 lozenge, Rfl: 3   nitroGLYCERIN  (NITROSTAT ) 0.4 MG SL tablet, Place 1 tablet (0.4 mg total) under the tongue  every 5 (five) minutes as needed for chest pain., Disp: 25 tablet, Rfl: 3   ondansetron  (ZOFRAN ) 4 MG tablet, Take 1 tablet (4 mg total) by mouth every 6 (six) hours as needed for nausea., Disp: 15 tablet, Rfl: 0   potassium chloride  (KLOR-CON  M) 10 MEQ tablet, Take 10 mEq by mouth every Monday, Wednesday, and Friday., Disp: , Rfl:    sitaGLIPtin  (JANUVIA ) 100 MG tablet, Take 1 tablet (100 mg total) by mouth daily. for diabetes., Disp: 90 tablet, Rfl: 1   umeclidinium-vilanterol (ANORO ELLIPTA ) 62.5-25 MCG/ACT AEPB, Inhale 1 puff into the lungs daily., Disp: 30 each, Rfl: 11   Subjective:   PATIENT ID: Sarah Payne, Sarah Payne  Chief Complaint  Patient presents with   Shortness of Breath    Shortness of breath on exertion and dry cough.    HPI  Discussed the use of AI scribe software for clinical note transcription with the patient, who gave verbal consent to proceed.  Sarah Payne is a 78 year old female with multivessel coronary artery disease who presents with shortness of breath for preoperative risk assessment.  She has been experiencing shortness of breath for approximately six weeks, with a sudden onset initially occurring one night accompanied by a sensation of numbness in her head. At that time, her blood pressure was 235/135 mmHg. The shortness of breath is consistent throughout the day, but she reports that her cough is less at night and she feels okay once in bed. No wheezing is present, and she has a chronic dry cough that has been present for years.  She has a history of coronary artery disease, having suffered a myocardial infarction ten years ago, which resulted in the placement of five stents. Recently, a cardiac catheterization revealed multivessel disease, leading to a recommendation for coronary artery bypass grafting (CABG). She is undergoing preoperative assessments, including pulmonary function tests (PFTs).  Her past medical  history includes a diagnosis of COPD, for which she was prescribed albuterol . She uses it infrequently but finds it helpful in alleviating her cough. She has been a smoker since the age of 42, smoking approximately one pack per day for 58 years, though she has recently reduced her consumption significantly in preparation for surgery.  Family history is notable for asthma in her granddaughter and grandson. She has no personal history of asthma or other significant respiratory conditions during her youth.  Social history reveals a long history of smoking and occupational exposure to cleaning chemicals and textile dust, though she reports minimal exposure to the latter. 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      Ancillary information including prior medications, full medical/surgical/family/social histories, and PFTs (when available) are listed below and have been reviewed.   {PULM QUESTIONNAIRES (Optional):33196}  ROS   Objective:   Vitals:   05/25/24 1345  BP: 130/78  Pulse: 84  Temp: 97.6 F (36.4 C)  TempSrc: Temporal  SpO2: 98%  Weight: 129 lb 3.2 oz (58.6 kg)  Height: 5' 6 (1.676 m)   98% on *** LPM *** RA BMI Readings from Last 3 Encounters:  05/25/24 20.85 kg/m  05/25/24 20.85 kg/m  05/09/24 22.18 kg/m   Wt Readings from Last 3 Encounters:  05/25/24 129 lb 3.2 oz (58.6 kg)  05/25/24 129 lb 3.2 oz (58.6 kg)  05/09/24 137 lb 6.4 oz (62.3 kg)    Physical Exam    Ancillary Information    Past Medical History:  Diagnosis Date   Acute pain of left shoulder 01/17/2019   Aortic atherosclerosis    CHF (congestive heart failure) (HCC) 09/16/2013   a.) TTE 09/16/2013: EF 55-60%; mod inferior HK, triv TR; G1DD. b.) TTE 01/02/2016: EF 25-30%; mid-apicalateroseptal, lat, inf, inferoseptal, apical akinesis; triv TR; G1DD. c. TTE 04/16/2016: EF 65%; G1DD. d.) TTE 05/02/2020: EF 50-55%; G2DD. e.) TTE 04/11/2021: EF 55%; G1DD.    Chronic low back pain with bilateral sciatica    Chronic painful diabetic neuropathy (HCC)    Chronic radicular lumbar pain    CKD (chronic kidney disease), stage III (HCC)    Clostridioides difficile infection 2015   COPD (chronic obstructive pulmonary disease) (HCC)    Coronary artery disease    a.) PCI 02/06/2011: 100% mLCx - DES x 3. b.) PCI 09/17/2011: 95% dRCA - DES x 1. c.) PCI 10/02/2011: 75% pLCX - DES x 1. d.) PCI 08/19/2012: DES x 2 p-mRCA.  e.) LHC 01/01/16: Takotsubo event 01/01/2016 with patent stents   Depression    Diverticulosis    Failed back syndrome    Frequent PVCs    a. Noted in hospital 12/2015.   GERD (gastroesophageal reflux disease)    History of bilateral cataract extraction 01/2015   History of heart artery stent    a.) TOTAL of 7 stents --> 02/06/2011 - overlapping 2.5 x 12 mm Xience V, 2.5 x 8 mm Xience V, 2.25 x 8 mm Xience Nano to mLCx; 09/17/2011 - 2.5 x 23 Xience V dRCA; 10/02/2011 - 2.5 x 15 mm Xience V pLCx; 08/09/2012 - overlapping 3.0 x 38mm mRCA and 3.5 x 23mm pRCA   Hyperlipidemia    Hypertension    Hypothyroidism    Impingement syndrome of left shoulder    LBBB (left bundle branch block)    Long term current use of clopidogrel     Marijuana abuse    Myocardial infarction [employer])    a.) multiple MIs;  5 per her report   Nephrolithiasis    OSA (obstructive sleep apnea)    a.) mild; does not require nocturnal PAP therapy   Pancreatitis 09/19/2022   Paroxysmal SVT (supraventricular tachycardia) 05/08/2021   a.)  Zio patch 05/08/2021: 3 distinct SVT runs; fastest 5 beats at a rate  of 146 bpm; longest 7 beats at rate of 134 bpm   Renal cyst, left    Stress at home    a.) husband has PTSD   T2DM (type 2 diabetes mellitus) (HCC)    Takotsubo cardiomyopathy    a. 12/2015 - nephew committed suicide 1 week prior, sister died the morning of presentation - initially called a STEMI; cath with patent stents. LVEF 25-30%.   Tendonitis of left rotator cuff     Tobacco abuse    Vaginal burning 03/22/2019   Vascular dementia      Family History  Problem Relation Age of Onset   Heart attack Mother        First MI @ 81 - Died @ 70   Heart disease Mother    Heart disease Father        Died @ 65   Throat cancer Brother    Liver cancer Brother    Colon cancer Sister      Past Surgical History:  Procedure Laterality Date   ABDOMINAL AORTOGRAM W/LOWER EXTREMITY N/A 09/12/2020   Procedure: ABDOMINAL AORTOGRAM W/LOWER EXTREMITY;  Surgeon: Darron Deatrice LABOR, MD;  Location: [facility] INVASIVE CV LAB;  Service: Cardiovascular;  Laterality: N/A;   ABDOMINAL HYSTERECTOMY     APPENDECTOMY     BACK SURGERY     CARDIAC CATHETERIZATION N/A 01/01/2016   Procedure: Left Heart Cath and Coronary Angiography;  Surgeon: Candyce GORMAN Reek, MD;  Location: [facility]ASIVE CV LAB;  Service: Cardiovascular;  Laterality: N/A;   CATARACT EXTRACTION W/PHACO Left 01/30/2015   Procedure: CATARACT EXTRACTION PHACO AND INTRAOCULAR LENS PLACEMENT (IOC);  Surgeon: Elsie Carmine, MD;  Location: [facility] ORS;  Service: Ophthalmology;  Laterality: Left;  US  00:47    CATARACT EXTRACTION W/PHACO Right 02/13/2015   Procedure: CATARACT EXTRACTION PHACO AND INTRAOCULAR LENS PLACEMENT (IOC);  Surgeon: Elsie Carmine, MD;  Location: [facility];  Service: Ophthalmology;  Laterality: Right;  cassette lot # [id number] H US   00:29.9 AP  20.7 CDE  6.20   COLONOSCOPY N/A 11/02/2014   Procedure: COLONOSCOPY;  Surgeon: Lamar JONETTA Aho, MD;  Location: [facility] ENDOSCOPY;  Service: Endoscopy;  Laterality: N/A;   CORONARY ANGIOPLASTY WITH STENT PLACEMENT Left 02/06/2011   Procedure: CORONARY ANGIOPLASTY WITH STENT PLACEMENT; Location: [facility]; Surgeon: Margie Lovelace, MD   CORONARY ANGIOPLASTY WITH STENT PLACEMENT Left 09/17/2011   Procedure: CORONARY ANGIOPLASTY WITH STENT PLACEMENT; Location: [facility]; Surgeon: Margie Lovelace, MD   CORONARY ANGIOPLASTY WITH STENT PLACEMENT Left 10/02/2011   Procedure: CORONARY  ANGIOPLASTY WITH STENT PLACEMENT; Location: [facility]; Surgeon: Margie Lovelace, MD   CORONARY ANGIOPLASTY WITH STENT PLACEMENT Left 08/19/2012   Procedure: CORONARY ANGIOPLASTY WITH STENT PLACEMENT; Location: [facility]; Surgeon: Deatrice Darron, MD   ESOPHAGOGASTRODUODENOSCOPY (EGD) WITH PROPOFOL  N/A 09/21/2018   Procedure: ESOPHAGOGASTRODUODENOSCOPY (EGD) WITH PROPOFOL ;  Surgeon: Therisa Bi, MD;  Location: [facility] ENDOSCOPY;  Service: Gastroenterology;  Laterality: N/A;   ESOPHAGOGASTRODUODENOSCOPY (EGD) WITH PROPOFOL  N/A 10/06/2022   Procedure: ESOPHAGOGASTRODUODENOSCOPY (EGD) WITH PROPOFOL ;  Surgeon: Therisa Bi, MD;  [facility] ENDOSCOPY;  Service: Gastroenterology;  Laterality: N/A;   EYE SURGERY     LEFT HEART CATH AND CORONARY ANGIOGRAPHY Left 08/02/2014   Procedure: LEFT HEART CATH AND CORONARY ANGIOGRAPHY; Location: [facility]; Surgeon: Alm Clay, MD   PERIPHERAL VASCULAR ATHERECTOMY Left 09/12/2020   Procedure: PERIPHERAL VASCULAR ATHERECTOMY;  Surgeon: Darron Deatrice LABOR, MD;  [facility] INVASIVE CV LAB;  Service: Cardiovascular;  Laterality: Left;   PERIPHERAL VASCULAR BALLOON ANGIOPLASTY  09/12/2020   Procedure: PERIPHERAL VASCULAR BALLOON ANGIOPLASTY;  Surgeon: Darron Deatrice LABOR, MD;  Location: [facility] INVASIVE CV LAB;  Service: Cardiovascular;;   REVERSE SHOULDER ARTHROPLASTY Left 10/01/2021   Procedure: REVERSE SHOULDER ARTHROPLASTY WITH BICEPS TENODESIS.;  Surgeon: Edie Norleen PARAS, MD;  Location: [facility];  Service: Orthopedics;  Laterality: Left;   RIGHT/LEFT HEART CATH AND CORONARY ANGIOGRAPHY Bilateral 05/16/2024   Procedure: RIGHT/LEFT HEART CATH AND CORONARY ANGIOGRAPHY;  Surgeon: Darron Deatrice LABOR, MD;  Location: [facility] INVASIVE CV LAB;  Service: Cardiovascular;  Laterality: Bilateral;   SHOULDER SURGERY Left 2017   THORACIC LAMINECTOMY FOR SPINAL CORD STIMULATOR N/A 10/27/2023   Procedure: THORACIC LAMINECTOMY FOR SPINAL CORD STIMULATOR PLACEMENT WITH INTERNAL PULSE GENERATOR IMPLANT;   Surgeon: Claudene Penne ORN, MD;  Location: [facility] ORS;  Service: Neurosurgery;  Laterality: N/A;  THORACIC LAMINECTOMY FOR SPINAL CORD STIMULATOR PLACEMENT WITH INTERNAL PULSE GENERATOR IMPLANT    Social History   Socioeconomic History   Marital status: Married    Spouse name: Laurier   Number of children: Not on file   Years of education: Not on file   Highest education level: Not on file  Occupational History   Not on file  Tobacco Use   Smoking status: Every Day    Current packs/day: 1.00    Average packs/day: 1 pack/day for 45.0 years (45.0 ttl pk-yrs)    Types: Cigarettes   Smokeless tobacco: Never   Tobacco comments:    Has cut back, trying to quit. 1 pack/day per patient.  Vaping Use   Vaping status: Some Days   Substances: Nicotine   Substance and Sexual Activity   Alcohol use: No    Alcohol/week: 0.0 standard drinks of alcohol   Drug use: Not Currently   Sexual activity: Not on file  Other Topics Concern   Not on file  Social History Narrative   Lives at home with her husband in [place].  Previously used marijuana - quit.      Regular exercise: no/ pain from a frozen rotator cuff   Caffeine use: coffee daily and pepsi      Does not have a living will.   Daughters and husband know her wishes- would desire CPR but not prolonged life support if futile   Social Drivers of Corporate Investment Banker Strain: Low Risk  (10/22/2023)   Overall Financial Resource Strain (CARDIA)    Difficulty of Paying Living Expenses: Not hard at all  Food Insecurity: No Food Insecurity (01/27/2024)   Hunger Vital Sign    Worried About Running Out of Food in the Last Year: Never true    Ran Out of Food in the Last Year: Never true  Transportation Needs: No Transportation Needs (01/27/2024)   PRAPARE - Administrator, Civil Service (Medical): No    Lack of Transportation (Non-Medical): No  Physical Activity: Insufficiently Active (10/22/2023)   Exercise Vital Sign    Days of  Exercise per Week: 3 days    Minutes of Exercise per Session: 20 min  Stress: No Stress Concern Present (10/22/2023)   [employer] of Occupational Health - Occupational Stress Questionnaire    Feeling of Stress : Only a little  Social Connections: Moderately Isolated (10/22/2023)   Social Connection and Isolation Panel    Frequency of Communication with Friends and Family: Twice a week    Frequency of Social Gatherings with Friends and Family: Once a week    Attends Religious Services: Never    Database Administrator or Organizations: No    Attends [employer]  or Organization Meetings: Never    Marital Status: Married  Catering Manager Violence: Unknown (01/27/2024)   Humiliation, Afraid, Rape, and Kick questionnaire    Fear of Current or Ex-Partner: No    Emotionally Abused: No    Physically Abused: Not on file    Sexually Abused: No     No Known Allergies   CBC    Component Value Date/Time   WBC 7.2 05/09/2024 1652   RBC 3.65 (L) 05/09/2024 1652   HGB 12.6 05/16/2024 1142   HGB 13.6 04/19/2021 0959   HCT 37.0 05/16/2024 1142   HCT 39.6 04/19/2021 0959   PLT 276 05/09/2024 1652   PLT 320 04/19/2021 0959   MCV 93.7 05/09/2024 1652   MCV 91 04/19/2021 0959   MCV 96 08/02/2014 0408   MCH 31.2 05/09/2024 1652   MCHC 33.3 05/09/2024 1652   RDW 14.8 05/09/2024 1652   RDW 13.1 04/19/2021 0959   RDW 15.6 (H) 08/02/2014 0408   LYMPHSABS 2.1 04/13/2024 0947   LYMPHSABS 2.6 08/30/2020 0941   LYMPHSABS 2.4 08/02/2014 0408   MONOABS 0.5 04/13/2024 0947   MONOABS 0.5 08/02/2014 0408   EOSABS 0.2 04/13/2024 0947   EOSABS 0.1 08/30/2020 0941   EOSABS 0.0 08/02/2014 0408   BASOSABS 0.0 04/13/2024 0947   BASOSABS 0.0 08/30/2020 0941   BASOSABS 0.0 08/02/2014 0408    Pulmonary Functions Testing Results:    Latest Ref Rng & Units 05/25/2024   12:32 PM  PFT Results  FVC-Pre L 2.57   FVC-Predicted Pre % 85   FVC-Post L 2.60   FVC-Predicted Post % 85   Pre FEV1/FVC % % 65   Post  FEV1/FCV % % 75   FEV1-Pre L 1.66   FEV1-Predicted Pre % 73   FEV1-Post L 1.96   DLCO uncorrected ml/min/mmHg 16.98   DLCO UNC% % 84   DLVA Predicted % 104   TLC L 4.22   TLC % Predicted % 78   RV % Predicted % 37     Outpatient Medications Prior to Visit  Medication Sig Dispense Refill   acetaminophen  (TYLENOL ) 650 MG CR tablet Take 1,300 mg by mouth daily. Takes with Gabapentin      albuterol  (VENTOLIN  HFA) 108 (90 Base) MCG/ACT inhaler Inhale 1-2 puffs into the lungs every 6 (six) hours as needed for wheezing or shortness of breath.     amLODipine  (NORVASC ) 10 MG tablet Take 1 tablet (10 mg total) by mouth daily. 90 tablet 0   aspirin  EC 81 MG tablet Take 1 tablet (81 mg total) by mouth daily. Swallow whole.     atorvastatin  (LIPITOR ) 80 MG tablet Take 1 tablet (80 mg total) by mouth daily. for cholesterol. 90 tablet 2   clobetasol  cream (TEMOVATE ) 0.05 % Apply 1 Application topically 2 (two) times daily as needed. For vaginal itching. 30 g 0   Continuous Glucose Receiver (FREESTYLE LIBRE 2 READER) DEVI Use with sensor to check blood sugars 6 times daily. 1 each 0   Continuous Glucose Sensor (FREESTYLE LIBRE 2 SENSOR) MISC USE TO CHECK BLOOD SUGAR. CHANGE EVERY 14 DAYS 6 each 0   ezetimibe  (ZETIA ) 10 MG tablet Take 1 tablet (10 mg total) by mouth daily. For cholesterol. 90 tablet 2   furosemide  (LASIX ) 20 MG tablet Take 1 tablet (20 mg total) by mouth daily as needed (As needed for weight gain greater than 3 pounds overnight or increased shortness of breath.). Take one tab only on Monday, Wednesday, & Fridays (Patient  taking differently: Take 20 mg by mouth every Monday, Wednesday, and Friday. Take one tab only on Monday, Wednesday, & Fridays) 36 tablet 3   gabapentin  (NEURONTIN ) 300 MG capsule Take 1 capsule (300 mg total) by mouth daily. May take an additional dose throughout the day if needed for pain 180 capsule 2   Glucagon  (GVOKE HYPOPEN  2-PACK) 1 MG/0.2ML SOAJ Inject 1 mg (1 pen) as  needed for severe low blood sugar (sustained glucose less than 55 despite oral glucose treatments). May repeat in 15 minutes as needed. 0.4 mL 1   insulin  aspart (NOVOLOG  FLEXPEN) 100 UNIT/ML FlexPen INJECT 6-16 UNITS SUBCUTANEOUSLY THREE TIMES DAILY WITH MEALS PER  SLIDING  SCALE  FOR  DIABETES 45 mL 0   insulin  degludec (TRESIBA  FLEXTOUCH) 100 UNIT/ML FlexTouch Pen Inject 10 Units into the skin daily.     Insulin  Pen Needle (INSUPEN PEN NEEDLES) 32G X 4 MM MISC Use to inject insulin  4 times daily. 250 each 1   isosorbide  mononitrate (IMDUR ) 30 MG 24 hr tablet Take 1 tablet (30 mg total) by mouth daily. 90 tablet 3   levothyroxine  (SYNTHROID ) 100 MCG tablet TAKE 1 TABLET BY MOUTH IN THE MORNING ON  AN  EMPTY  STOMACH  WITH  WATER   ONLY.  NO  FOOD  OR  OTHER  MEDICATIONS  FOR  30  MINUTES 90 tablet 2   mirtazapine  (REMERON ) 15 MG tablet Take 1 tablet (15 mg total) by mouth at bedtime. For sleep (Patient taking differently: Take 15 mg by mouth at bedtime as needed (sleep).) 90 tablet 2   nitroGLYCERIN  (NITROSTAT ) 0.4 MG SL tablet Place 1 tablet (0.4 mg total) under the tongue every 5 (five) minutes as needed for chest pain. 25 tablet 3   ondansetron  (ZOFRAN ) 4 MG tablet Take 1 tablet (4 mg total) by mouth every 6 (six) hours as needed for nausea. 15 tablet 0   potassium chloride  (KLOR-CON  M) 10 MEQ tablet Take 10 mEq by mouth every Monday, Wednesday, and Friday.     sitaGLIPtin  (JANUVIA ) 100 MG tablet Take 1 tablet (100 mg total) by mouth daily. for diabetes. 90 tablet 1   carvedilol  (COREG ) 12.5 MG tablet Take 1 tablet (12.5 mg total) by mouth 2 (two) times daily. (Patient not taking: Reported on 05/25/2024) 180 tablet 3   No facility-administered medications prior to visit.

## 2024-05-25 NOTE — Telephone Encounter (Signed)
 Please call patient:  I sent plenty of refills to lumbar pharmacy yesterday.  She needs to call her pharmacy.

## 2024-05-25 NOTE — Patient Instructions (Signed)
 Full PFT completed today ? ?

## 2024-05-25 NOTE — Progress Notes (Signed)
 Full PFT completed today ? ?

## 2024-05-26 ENCOUNTER — Telehealth: Payer: Self-pay | Admitting: Primary Care

## 2024-05-26 ENCOUNTER — Other Ambulatory Visit: Payer: Self-pay | Admitting: Primary Care

## 2024-05-26 DIAGNOSIS — E1151 Type 2 diabetes mellitus with diabetic peripheral angiopathy without gangrene: Secondary | ICD-10-CM

## 2024-05-26 NOTE — Telephone Encounter (Signed)
 Left message on voicemail, per dpr, relaying [name]'s message.

## 2024-05-26 NOTE — Telephone Encounter (Signed)
 Copied from CRM [id number]. Topic: Clinical - Medication Refill >> May 26, 2024 10:01 AM Alfonso HERO wrote: Medication: insulin  aspart (NOVOLOG  FLEXPEN) 100 UNIT/ML FlexPen  Has the patient contacted their pharmacy? Yes (Agent: If no, request that the patient contact the pharmacy for the refill. If patient does not wish to contact the pharmacy document the reason why and proceed with request.) (Agent: If yes, when and what did the pharmacy advise?)  This is the patient's preferred pharmacy:  [facility] [address], [place] - [address] [place] [address] Phone: [phone] Fax: [phone]  Is this the correct pharmacy for this prescription? Yes If no, delete pharmacy and type the correct one.   Has the prescription been filled recently? Yes  Is the patient out of the medication? Yes  Has the patient been seen for an appointment in the last year OR does the patient have an upcoming appointment? Yes  Can we respond through MyChart? Yes  Agent: Please be advised that Rx refills may take up to 3 business days. We ask that you follow-up with your pharmacy.  Patient states she is leaving this afternoon for [place] . Stating she has to go have open heart surgery there and needs her medication before she leaves this afternoon.

## 2024-05-26 NOTE — Telephone Encounter (Signed)
 Refills sent to pharmacy.

## 2024-05-31 ENCOUNTER — Ambulatory Visit: Attending: Physician Assistant

## 2024-06-02 ENCOUNTER — Telehealth: Payer: Self-pay | Admitting: *Deleted

## 2024-06-02 NOTE — Telephone Encounter (Signed)
 Called placed to the patient concerning an app[ointment with Dr. Arida for a possible PCI. The daughter stated that the patient be meeting with [facility] to discuss a possible CABG.   The patient has been set up with Dr. Darron on 06/21/24. If the patient ends up having a CABG, the daughter will call back to cancel the appointment.

## 2024-06-07 ENCOUNTER — Ambulatory Visit: Admitting: Physician Assistant

## 2024-06-08 DIAGNOSIS — I25119 Atherosclerotic heart disease of native coronary artery with unspecified angina pectoris: Secondary | ICD-10-CM | POA: Diagnosis not present

## 2024-06-08 DIAGNOSIS — I340 Nonrheumatic mitral (valve) insufficiency: Secondary | ICD-10-CM | POA: Diagnosis not present

## 2024-06-08 DIAGNOSIS — F17200 Nicotine dependence, unspecified, uncomplicated: Secondary | ICD-10-CM | POA: Diagnosis not present

## 2024-06-08 DIAGNOSIS — I119 Hypertensive heart disease without heart failure: Secondary | ICD-10-CM | POA: Diagnosis not present

## 2024-06-13 DIAGNOSIS — I25119 Atherosclerotic heart disease of native coronary artery with unspecified angina pectoris: Secondary | ICD-10-CM | POA: Diagnosis not present

## 2024-06-21 ENCOUNTER — Encounter: Payer: Self-pay | Admitting: Cardiovascular Disease

## 2024-06-21 ENCOUNTER — Ambulatory Visit: Attending: Cardiovascular Disease | Admitting: Cardiovascular Disease

## 2024-06-21 VITALS — BP 130/74 | HR 83 | Ht 66.0 in | Wt 130.2 lb

## 2024-06-21 DIAGNOSIS — I739 Peripheral vascular disease, unspecified: Secondary | ICD-10-CM

## 2024-06-21 DIAGNOSIS — I25118 Atherosclerotic heart disease of native coronary artery with other forms of angina pectoris: Secondary | ICD-10-CM | POA: Diagnosis not present

## 2024-06-21 DIAGNOSIS — I2510 Atherosclerotic heart disease of native coronary artery without angina pectoris: Secondary | ICD-10-CM

## 2024-06-21 DIAGNOSIS — E785 Hyperlipidemia, unspecified: Secondary | ICD-10-CM | POA: Diagnosis not present

## 2024-06-21 DIAGNOSIS — I10 Essential (primary) hypertension: Secondary | ICD-10-CM

## 2024-06-21 MED ORDER — CLOPIDOGREL BISULFATE 75 MG PO TABS: 75.0000 mg | ORAL_TABLET | Freq: Every day | ORAL | 3 refills | Status: AC

## 2024-06-21 NOTE — Progress Notes (Unsigned)
 Cardiology Office Note   Date:  06/21/2024   ID:  Sarah, Payne 14-Dec-1945, MRN [id number]  PCP:  Gretta Comer MARLA, NP  Cardiologist:  Dr. Perla  Chief Complaint  Patient presents with   Follow-up    Possible PCI no complaints today. Pt hx of transmitter unable to cut off for EKG. Pt requesting to call daughter on phone to listen in on visit. Meds reviewed verbally with pt.      History of Present Illness: Sarah Payne is a 78 y.o. female who is here today for follow-up visit regarding peripheral arterial disease.   She has known history of coronary artery disease status post multiple PCIs, chronic systolic heart failure, tobacco use, poorly controlled diabetes, essential hypertension and hyperlipidemia.  She continues to smoke 1 pack/day. She was seen recently for severe left calf claudication with some rest pain at night.   Noninvasive vascular studies showed an ABI of 1.08 on the right and 0.63 on the left with absent toe pressure. Duplex showed no significant aortoiliac disease. There was evidence of subtotal occlusion in the distal left SFA. I proceeded with angiography last month which showed no significant aortoiliac disease.  On the left side, there was severe calcified disease in the distal SFA with two-vessel runoff below the knee with occluded anterior tibial artery.  Posterior tibial artery had moderate diffuse disease in the mid to distal segment.  I performed successful orbital atherectomy and drug-coated balloon angioplasty to the left SFA.  Postprocedure ABI improved to normal with patent left SFA. She reports complete resolution of left calf claudication.  She is doing very well with no chest pain or shortness of breath.  Past Medical History:  Diagnosis Date   Acute pain of left shoulder 01/17/2019   Aortic atherosclerosis    CHF (congestive heart failure) (HCC) 09/16/2013   a.) TTE 09/16/2013: EF 55-60%; mod inferior HK, triv TR; G1DD. b.) TTE 01/02/2016:  EF 25-30%; mid-apicalateroseptal, lat, inf, inferoseptal, apical akinesis; triv TR; G1DD. c. TTE 04/16/2016: EF 65%; G1DD. d.) TTE 05/02/2020: EF 50-55%; G2DD. e.) TTE 04/11/2021: EF 55%; G1DD.   Chronic low back pain with bilateral sciatica    Chronic painful diabetic neuropathy (HCC)    Chronic radicular lumbar pain    CKD (chronic kidney disease), stage III (HCC)    Clostridioides difficile infection 2015   COPD (chronic obstructive pulmonary disease) (HCC)    Coronary artery disease    a.) PCI 02/06/2011: 100% mLCx - DES x 3. b.) PCI 09/17/2011: 95% dRCA - DES x 1. c.) PCI 10/02/2011: 75% pLCX - DES x 1. d.) PCI 08/19/2012: DES x 2 p-mRCA.  e.) LHC 01/01/16: Takotsubo event 01/01/2016 with patent stents   Depression    Diverticulosis    Failed back syndrome    Frequent PVCs    a. Noted in hospital 12/2015.   GERD (gastroesophageal reflux disease)    History of bilateral cataract extraction 01/2015   History of heart artery stent    a.) TOTAL of 7 stents --> 02/06/2011 - overlapping 2.5 x 12 mm Xience V, 2.5 x 8 mm Xience V, 2.25 x 8 mm Xience Nano to mLCx; 09/17/2011 - 2.5 x 23 Xience V dRCA; 10/02/2011 - 2.5 x 15 mm Xience V pLCx; 08/09/2012 - overlapping 3.0 x 38mm mRCA and 3.5 x 23mm pRCA   Hyperlipidemia    Hypertension    Hypothyroidism    Impingement syndrome of left shoulder    LBBB (left  bundle branch block)    Long term current use of clopidogrel     Marijuana abuse    Myocardial infarction [facility])    a.) multiple MIs;  5 per her report   Nephrolithiasis    OSA (obstructive sleep apnea)    a.) mild; does not require nocturnal PAP therapy   Pancreatitis 09/19/2022   Paroxysmal SVT (supraventricular tachycardia) 05/08/2021   a.)  Zio patch 05/08/2021: 3 distinct SVT runs; fastest 5 beats at a rate of 146 bpm; longest 7 beats at rate of 134 bpm   Renal cyst, left    Stress at home    a.) husband has PTSD   T2DM (type 2 diabetes mellitus) (HCC)    Takotsubo cardiomyopathy     a. 12/2015 - nephew committed suicide 1 week prior, sister died the morning of presentation - initially called a STEMI; cath with patent stents. LVEF 25-30%.   Tendonitis of left rotator cuff    Tobacco abuse    Vaginal burning 03/22/2019   Vascular dementia     Past Surgical History:  Procedure Laterality Date   ABDOMINAL AORTOGRAM W/LOWER EXTREMITY N/A 09/12/2020   Procedure: ABDOMINAL AORTOGRAM W/LOWER EXTREMITY;  Surgeon: Darron Deatrice LABOR, MD;  Location: [facility] INVASIVE CV LAB;  Service: Cardiovascular;  Laterality: N/A;   ABDOMINAL HYSTERECTOMY     APPENDECTOMY     BACK SURGERY     CARDIAC CATHETERIZATION N/A 01/01/2016   Procedure: Left Heart Cath and Coronary Angiography;  Surgeon: Candyce GORMAN Reek, MD;  Location: [facility] INVASIVE CV LAB;  Service: Cardiovascular;  Laterality: N/A;   CATARACT EXTRACTION W/PHACO Left 01/30/2015   Procedure: CATARACT EXTRACTION PHACO AND INTRAOCULAR LENS PLACEMENT (IOC);  Surgeon: Elsie Carmine, MD;  Location: [facility] ORS;  Service: Ophthalmology;  Laterality: Left;  US  00:47    CATARACT EXTRACTION W/PHACO Right 02/13/2015   Procedure: CATARACT EXTRACTION PHACO AND INTRAOCULAR LENS PLACEMENT (IOC);  Surgeon: Elsie Carmine, MD;  Location: [facility] ORS;  Service: Ophthalmology;  Laterality: Right;  cassette lot # [id number] H US   00:29.9 AP  20.7 CDE  6.20   COLONOSCOPY N/A 11/02/2014   Procedure: COLONOSCOPY;  Surgeon: Lamar JONETTA Aho, MD;  Location: [facility] ENDOSCOPY;  Service: Endoscopy;  Laterality: N/A;   CORONARY ANGIOPLASTY WITH STENT PLACEMENT Left 02/06/2011   Procedure: CORONARY ANGIOPLASTY WITH STENT PLACEMENT; Location: [facility]; Surgeon: Margie Lovelace, MD   CORONARY ANGIOPLASTY WITH STENT PLACEMENT Left 09/17/2011   Procedure: CORONARY ANGIOPLASTY WITH STENT PLACEMENT; Location: [facility]; Surgeon: Margie Lovelace, MD   CORONARY ANGIOPLASTY WITH STENT PLACEMENT Left 10/02/2011   Procedure: CORONARY ANGIOPLASTY WITH STENT PLACEMENT; Location: [facility]; Surgeon:  Margie Lovelace, MD   CORONARY ANGIOPLASTY WITH STENT PLACEMENT Left 08/19/2012   Procedure: CORONARY ANGIOPLASTY WITH STENT PLACEMENT; Location: [facility]; Surgeon: Deatrice Darron, MD   ESOPHAGOGASTRODUODENOSCOPY (EGD) WITH PROPOFOL  N/A 09/21/2018   Procedure: ESOPHAGOGASTRODUODENOSCOPY (EGD) WITH PROPOFOL ;  Surgeon: Therisa Bi, MD;  Location: [facility] ENDOSCOPY;  Service: Gastroenterology;  Laterality: N/A;   ESOPHAGOGASTRODUODENOSCOPY (EGD) WITH PROPOFOL  N/A 10/06/2022   Procedure: ESOPHAGOGASTRODUODENOSCOPY (EGD) WITH PROPOFOL ;  Surgeon: Therisa Bi, MD;  Location: [facility] ENDOSCOPY;  Service: Gastroenterology;  Laterality: N/A;   EYE SURGERY     LEFT HEART CATH AND CORONARY ANGIOGRAPHY Left 08/02/2014   Procedure: LEFT HEART CATH AND CORONARY ANGIOGRAPHY; Location: [facility]; Surgeon: Alm Clay, MD   PERIPHERAL VASCULAR ATHERECTOMY Left 09/12/2020   Procedure: PERIPHERAL VASCULAR ATHERECTOMY;  Surgeon: Darron Deatrice LABOR, MD;  Location: [facility] INVASIVE CV LAB;  Service: Cardiovascular;  Laterality: Left;   PERIPHERAL VASCULAR  BALLOON ANGIOPLASTY  09/12/2020   Procedure: PERIPHERAL VASCULAR BALLOON ANGIOPLASTY;  Surgeon: Darron Deatrice LABOR, MD;  Location: [facility] INVASIVE CV LAB;  Service: Cardiovascular;;   REVERSE SHOULDER ARTHROPLASTY Left 10/01/2021   Procedure: REVERSE SHOULDER ARTHROPLASTY WITH BICEPS TENODESIS.;  Surgeon: Edie Norleen PARAS, MD;  Location: [facility] ORS;  Service: Orthopedics;  Laterality: Left;   RIGHT/LEFT HEART CATH AND CORONARY ANGIOGRAPHY Bilateral 05/16/2024   Procedure: RIGHT/LEFT HEART CATH AND CORONARY ANGIOGRAPHY;  Surgeon: Darron Deatrice LABOR, MD;  Location: [facility] INVASIVE CV LAB;  Service: Cardiovascular;  Laterality: Bilateral;   SHOULDER SURGERY Left 2017   THORACIC LAMINECTOMY FOR SPINAL CORD STIMULATOR N/A 10/27/2023   Procedure: THORACIC LAMINECTOMY FOR SPINAL CORD STIMULATOR PLACEMENT WITH INTERNAL PULSE GENERATOR IMPLANT;  Surgeon: Claudene Penne ORN, MD;  Location: [facility] ORS;   Service: Neurosurgery;  Laterality: N/A;  THORACIC LAMINECTOMY FOR SPINAL CORD STIMULATOR PLACEMENT WITH INTERNAL PULSE GENERATOR IMPLANT     Current Outpatient Medications  Medication Sig Dispense Refill   acetaminophen  (TYLENOL ) 650 MG CR tablet Take 1,300 mg by mouth daily. Takes with Gabapentin      albuterol  (VENTOLIN  HFA) 108 (90 Base) MCG/ACT inhaler Inhale 1-2 puffs into the lungs every 6 (six) hours as needed for wheezing or shortness of breath.     amLODipine  (NORVASC ) 10 MG tablet Take 1 tablet (10 mg total) by mouth daily. 90 tablet 0   aspirin  EC 81 MG tablet Take 1 tablet (81 mg total) by mouth daily. Swallow whole.     atorvastatin  (LIPITOR ) 80 MG tablet Take 1 tablet (80 mg total) by mouth daily. for cholesterol. 90 tablet 2   clobetasol  cream (TEMOVATE ) 0.05 % Apply 1 Application topically 2 (two) times daily as needed. For vaginal itching. 30 g 0   Continuous Glucose Receiver (FREESTYLE LIBRE 2 READER) DEVI Use with sensor to check blood sugars 6 times daily. 1 each 0   Continuous Glucose Sensor (FREESTYLE LIBRE 2 SENSOR) MISC USE TO CHECK BLOOD SUGAR. CHANGE EVERY 14 DAYS 6 each 0   ezetimibe  (ZETIA ) 10 MG tablet Take 1 tablet (10 mg total) by mouth daily. For cholesterol. 90 tablet 2   furosemide  (LASIX ) 20 MG tablet Take 1 tablet (20 mg total) by mouth daily as needed (As needed for weight gain greater than 3 pounds overnight or increased shortness of breath.). Take one tab only on Monday, Wednesday, & Fridays (Patient taking differently: Take 20 mg by mouth every Monday, Wednesday, and Friday. Take one tab only on Monday, Wednesday, & Fridays) 36 tablet 3   gabapentin  (NEURONTIN ) 300 MG capsule Take 1 capsule (300 mg total) by mouth daily. May take an additional dose throughout the day if needed for pain 180 capsule 2   Glucagon  (GVOKE HYPOPEN  2-PACK) 1 MG/0.2ML SOAJ Inject 1 mg (1 pen) as needed for severe low blood sugar (sustained glucose less than 55 despite oral glucose  treatments). May repeat in 15 minutes as needed. 0.4 mL 1   insulin  aspart (NOVOLOG  FLEXPEN) 100 UNIT/ML FlexPen INJECT 5 UNITS SUBCUTANEOUSLY THREE TIMES DAILY WITH MEALS PER SLIDING SCALE (FOR DIABETES) 45 mL 1   insulin  degludec (TRESIBA  FLEXTOUCH) 100 UNIT/ML FlexTouch Pen Inject 10 Units into the skin daily.     Insulin  Pen Needle (INSUPEN PEN NEEDLES) 32G X 4 MM MISC Use to inject insulin  4 times daily. 250 each 1   isosorbide  mononitrate (IMDUR ) 30 MG 24 hr tablet Take 1 tablet (30 mg total) by mouth daily. 90 tablet 3   levothyroxine  (SYNTHROID ) 100  MCG tablet TAKE 1 TABLET BY MOUTH IN THE MORNING ON  AN  EMPTY  STOMACH  WITH  WATER   ONLY.  NO  FOOD  OR  OTHER  MEDICATIONS  FOR  30  MINUTES 90 tablet 2   mirtazapine  (REMERON ) 15 MG tablet Take 1 tablet (15 mg total) by mouth at bedtime. For sleep (Patient taking differently: Take 15 mg by mouth at bedtime as needed (sleep).) 90 tablet 2   nicotine  (NICODERM CQ  - DOSED IN MG/24 HOURS) 21 mg/24hr patch Place 1 patch (21 mg total) onto the skin daily. 42 patch 0   nitroGLYCERIN  (NITROSTAT ) 0.4 MG SL tablet Place 1 tablet (0.4 mg total) under the tongue every 5 (five) minutes as needed for chest pain. 25 tablet 3   ondansetron  (ZOFRAN ) 4 MG tablet Take 1 tablet (4 mg total) by mouth every 6 (six) hours as needed for nausea. 15 tablet 0   potassium chloride  (KLOR-CON  M) 10 MEQ tablet Take 10 mEq by mouth every Monday, Wednesday, and Friday.     sitaGLIPtin  (JANUVIA ) 100 MG tablet Take 1 tablet (100 mg total) by mouth daily. for diabetes. 90 tablet 1   umeclidinium-vilanterol (ANORO ELLIPTA ) 62.5-25 MCG/ACT AEPB Inhale 1 puff into the lungs daily. 30 each 11   nicotine  polacrilex (NICOTINE  MINI) 2 MG lozenge Take 1 lozenge (2 mg total) by mouth every 2 (two) hours as needed for smoking cessation. (Patient not taking: Reported on 06/21/2024) 72 lozenge 3   No current facility-administered medications for this visit.    Allergies:   Patient has no  known allergies.    Social History:  The patient  reports that she has been smoking cigarettes. She has a 45 pack-year smoking history. She has never used smokeless tobacco. She reports that she does not currently use drugs. She reports that she does not drink alcohol.   Family History:  The patient's family history includes Colon cancer in her sister; Heart attack in her mother; Heart disease in her father and mother; Liver cancer in her brother; Throat cancer in her brother.    ROS:  Please see the history of present illness.   Otherwise, review of systems are positive for none.   All other systems are reviewed and negative.    PHYSICAL EXAM: VS:  BP 130/74 (BP Location: Left Arm, Patient Position: Sitting, Cuff Size: Normal)   Pulse 83   Ht 5' 6 (1.676 m)   Wt 130 lb 4 oz (59.1 kg)   SpO2 98%   BMI 21.02 kg/m  , BMI Body mass index is 21.02 kg/m. GEN: Well nourished, well developed, in no acute distress  HEENT: normal  Neck: no JVD, carotid bruits, or masses Cardiac: RRR; no murmurs, rubs, or gallops,no edema  Respiratory:  clear to auscultation bilaterally, normal work of breathing GI: soft, nontender, nondistended, + BS MS: no deformity or atrophy  Skin: warm and dry, no rash Neuro:  Strength and sensation are intact Psych: euthymic mood, full affect Vascular: Femoral pulses normal bilaterally.  No groin hematoma.  She has palpable posterior tibial pulse on the left.   EKG:  EKG is ordered today.  Normal sinus rhythm Left bundle branch block When compared with ECG of 09-May-2024 15:23, Vent. rate has increased BY  27 BPM Left bundle branch block is now Present Criteria for Anteroseptal infarct are no longer Present   Recent Labs: 03/24/2024: TSH 1.21 04/26/2024: B Natriuretic Peptide 262.5 05/09/2024: ALT 42; BUN 22; Creatinine, Ser 1.39; Platelets  276 05/16/2024: Hemoglobin 12.6; Potassium 3.2; Sodium 139    Lipid Panel    Component Value Date/Time   CHOL 102  03/24/2024 0919   CHOL 221 (H) 09/11/2023 1027   CHOL 292 (H) 08/02/2014 0408   TRIG 75.0 03/24/2024 0919   TRIG 388 (H) 08/02/2014 0408   HDL 43.80 03/24/2024 0919   HDL 67 09/11/2023 1027   HDL 34 (L) 08/02/2014 0408   CHOLHDL 2 03/24/2024 0919   VLDL 15.0 03/24/2024 0919   VLDL 78 (H) 08/02/2014 0408   LDLCALC 44 03/24/2024 0919   LDLCALC 134 (H) 09/11/2023 1027   LDLCALC 180 (H) 08/02/2014 0408   LDLDIRECT 52 08/28/2022 1509      Wt Readings from Last 3 Encounters:  06/21/24 130 lb 4 oz (59.1 kg)  05/25/24 129 lb 3.2 oz (58.6 kg)  05/25/24 129 lb 3.2 oz (58.6 kg)            No data to display            ASSESSMENT AND PLAN:  1. Peripheral arterial disease: Status post recent orbital atherectomy and drug-coated balloon angioplasty to the left SFA with excellent results.  Complete resolution of left calf claudication.  She is already on dual antiplatelet therapy with aspirin  and clopidogrel .  Repeat vascular studies in 6 months.  2. Coronary artery disease involving native coronary arteries: No anginal symptoms at the present time.  3. Hyperlipidemia: Continue treatment with rosuvastatin  and Zetia .  Most recent lipid profile showed an LDL of 29.  4. Chronic systolic heart failure: She appears to be euvolemic  5. Essential hypertension: Blood pressure is mildly elevated.  Might need to consider another antihypertensive medication such as amlodipine  or a thiazide diuretic.  Not able to increase carvedilol  given baseline bradycardia.  6. Tobacco use: Smoking cessation is advised    Disposition:   FU with me in 6 months  Signed,  Deatrice Cage, MD  06/21/2024 3:32 PM    [facility]

## 2024-06-21 NOTE — H&P (View-Only) (Signed)
 Cardiology Office Note   Date:  06/21/2024   ID:  Sarah Payne, Sarah Payne April 06, 1946, MRN [id number]  PCP:  Gretta Comer MARLA, NP  Cardiologist:  Dr. Perla  Chief Complaint  Patient presents with   Follow-up    Possible PCI no complaints today. Pt hx of transmitter unable to cut off for EKG. Pt requesting to call daughter on phone to listen in on visit. Meds reviewed verbally with pt.      History of Present Illness: Sarah Payne is a 78 y.o. female who is here today for follow-up visit regarding coronary artery disease and peripheral arterial disease.   She has known history of coronary artery disease status post multiple PCIs, chronic systolic heart failure, tobacco use, poorly controlled diabetes, intermittent left bundle branch block, essential hypertension and hyperlipidemia.    She was seen by me in 2022 for severe left calf claudication with some rest pain at night.   Noninvasive vascular studies showed an ABI of 1.08 on the right and 0.63 on the left with absent toe pressure. Duplex showed no significant aortoiliac disease. There was evidence of subtotal occlusion in the distal left SFA. Angiography in February 2022 showed no significant aortoiliac disease.  On the left side, there was severe calcified disease in the distal SFA with two-vessel runoff below the knee with occluded anterior tibial artery.  Posterior tibial artery had moderate diffuse disease in the mid to distal segment.  I performed successful orbital atherectomy and drug-coated balloon angioplasty to the left SFA.  Postprocedure ABI improved to normal with patent left SFA. Most recent lower extremity Doppler studies in March 2024 showed normal ABI with normal velocities in the left lower extremity.  She was seen in October for chest pain with EKG changes.  Cardiac catheterization was done by me in October.  It showed significant diffuse three-vessel coronary artery disease and normal LV systolic function.  Right  heart catheterization showed normal right and left-sided filling pressures, normal pulmonary pressure and normal cardiac output.  She had significant right radial artery spasm which made the procedure very difficult.  The patient was referred to cardiothoracic surgery for evaluation of CABG but she was felt to be not a good candidate considering her comorbidities.  She underwent an echocardiogram there which showed an EF of 50% with no significant valvular abnormalities.  Carotid Doppler showed mild nonobstructive disease bilaterally.  She is now referred back to me to consider multivessel PCI.  She continues to have chest pain.  Past Medical History:  Diagnosis Date   Acute pain of left shoulder 01/17/2019   Aortic atherosclerosis    CHF (congestive heart failure) (HCC) 09/16/2013   a.) TTE 09/16/2013: EF 55-60%; mod inferior HK, triv TR; G1DD. b.) TTE 01/02/2016: EF 25-30%; mid-apicalateroseptal, lat, inf, inferoseptal, apical akinesis; triv TR; G1DD. c. TTE 04/16/2016: EF 65%; G1DD. d.) TTE 05/02/2020: EF 50-55%; G2DD. e.) TTE 04/11/2021: EF 55%; G1DD.   Chronic low back pain with bilateral sciatica    Chronic painful diabetic neuropathy (HCC)    Chronic radicular lumbar pain    CKD (chronic kidney disease), stage III (HCC)    Clostridioides difficile infection 2015   COPD (chronic obstructive pulmonary disease) (HCC)    Coronary artery disease    a.) PCI 02/06/2011: 100% mLCx - DES x 3. b.) PCI 09/17/2011: 95% dRCA - DES x 1. c.) PCI 10/02/2011: 75% pLCX - DES x 1. d.) PCI 08/19/2012: DES x 2 p-mRCA.  e.) LHC 01/01/16:  Takotsubo event 01/01/2016 with patent stents   Depression    Diverticulosis    Failed back syndrome    Frequent PVCs    a. Noted in hospital 12/2015.   GERD (gastroesophageal reflux disease)    History of bilateral cataract extraction 01/2015   History of heart artery stent    a.) TOTAL of 7 stents --> 02/06/2011 - overlapping 2.5 x 12 mm Xience V, 2.5 x 8 mm Xience V,  2.25 x 8 mm Xience Nano to mLCx; 09/17/2011 - 2.5 x 23 Xience V dRCA; 10/02/2011 - 2.5 x 15 mm Xience V pLCx; 08/09/2012 - overlapping 3.0 x 38mm mRCA and 3.5 x 23mm pRCA   Hyperlipidemia    Hypertension    Hypothyroidism    Impingement syndrome of left shoulder    LBBB (left bundle branch block)    Long term current use of clopidogrel     Marijuana abuse    Myocardial infarction [facility])    a.) multiple MIs;  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 12/2015 - nephew committed suicide 1 week prior, sister died the morning of presentation - initially called a STEMI; cath with patent stents. LVEF 25-30%.   Tendonitis of left rotator cuff    Tobacco abuse    Vaginal burning 03/22/2019   Vascular dementia     Past Surgical History:  Procedure Laterality Date   ABDOMINAL AORTOGRAM W/LOWER EXTREMITY N/A 09/12/2020   Procedure: ABDOMINAL AORTOGRAM W/LOWER EXTREMITY;  Surgeon: Darron Deatrice LABOR, MD;  Location: [facility] INVASIVE CV LAB;  Service: Cardiovascular;  Laterality: N/A;   ABDOMINAL HYSTERECTOMY     APPENDECTOMY     BACK SURGERY     CARDIAC CATHETERIZATION N/A 01/01/2016   Procedure: Left Heart Cath and Coronary Angiography;  Surgeon: Candyce GORMAN Reek, MD;  Location: [facility] INVASIVE CV LAB;  Service: Cardiovascular;  Laterality: N/A;   CATARACT EXTRACTION W/PHACO Left 01/30/2015   Procedure: CATARACT EXTRACTION PHACO AND INTRAOCULAR LENS PLACEMENT (IOC);  Surgeon: Elsie Carmine, MD;  Location: [facility] ORS;  Service: Ophthalmology;  Laterality: Left;  US  00:47    CATARACT  EXTRACTION W/PHACO Right 02/13/2015   Procedure: CATARACT EXTRACTION PHACO AND INTRAOCULAR LENS PLACEMENT (IOC);  Surgeon: Elsie Carmine, MD;  Location: [facility] ORS;  Service: Ophthalmology;  Laterality: Right;  cassette lot # [id number] H US   00:29.9 AP  20.7 CDE  6.20   COLONOSCOPY N/A 11/02/2014   Procedure: COLONOSCOPY;  Surgeon: Lamar JONETTA Aho, MD;  Location: [facility] ENDOSCOPY;  Service: Endoscopy;  Laterality: N/A;   CORONARY ANGIOPLASTY WITH STENT PLACEMENT Left 02/06/2011   Procedure: CORONARY ANGIOPLASTY WITH STENT PLACEMENT; Location: [facility]; Surgeon: Margie Lovelace, MD   CORONARY ANGIOPLASTY WITH STENT PLACEMENT Left 09/17/2011   Procedure: CORONARY ANGIOPLASTY WITH STENT PLACEMENT; Location: [facility]; Surgeon: Margie Lovelace, MD   CORONARY ANGIOPLASTY WITH STENT PLACEMENT Left 10/02/2011   Procedure: CORONARY ANGIOPLASTY WITH STENT PLACEMENT; Location: [facility]; Surgeon: Margie Lovelace, MD   CORONARY ANGIOPLASTY WITH STENT PLACEMENT Left 08/19/2012  Procedure: CORONARY ANGIOPLASTY WITH STENT PLACEMENT; Location: [facility]; Surgeon: Deatrice Cage, MD   ESOPHAGOGASTRODUODENOSCOPY (EGD) WITH PROPOFOL  N/A 09/21/2018   Procedure: ESOPHAGOGASTRODUODENOSCOPY (EGD) WITH PROPOFOL ;  Surgeon: Therisa Bi, MD;  Location: [facility] ENDOSCOPY;  Service: Gastroenterology;  Laterality: N/A;   ESOPHAGOGASTRODUODENOSCOPY (EGD) WITH PROPOFOL  N/A 10/06/2022   Procedure: ESOPHAGOGASTRODUODENOSCOPY (EGD) WITH PROPOFOL ;  Surgeon: Therisa Bi, MD;  Location: [facility] ENDOSCOPY;  Service: Gastroenterology;  Laterality: N/A;   EYE SURGERY     LEFT HEART CATH AND CORONARY ANGIOGRAPHY Left 08/02/2014   Procedure: LEFT HEART CATH AND CORONARY ANGIOGRAPHY; Location: [facility]; Surgeon: Alm Clay, MD   PERIPHERAL VASCULAR ATHERECTOMY Left 09/12/2020   Procedure: PERIPHERAL VASCULAR ATHERECTOMY;  Surgeon: Cage Deatrice LABOR, MD;  Location: [facility] INVASIVE CV LAB;  Service: Cardiovascular;  Laterality: Left;   PERIPHERAL VASCULAR BALLOON  ANGIOPLASTY  09/12/2020   Procedure: PERIPHERAL VASCULAR BALLOON ANGIOPLASTY;  Surgeon: Cage Deatrice LABOR, MD;  Location: [facility] INVASIVE CV LAB;  Service: Cardiovascular;;   REVERSE SHOULDER ARTHROPLASTY Left 10/01/2021   Procedure: REVERSE SHOULDER ARTHROPLASTY WITH BICEPS TENODESIS.;  Surgeon: Edie Norleen PARAS, MD;  Location: [facility] ORS;  Service: Orthopedics;  Laterality: Left;   RIGHT/LEFT HEART CATH AND CORONARY ANGIOGRAPHY Bilateral 05/16/2024   Procedure: RIGHT/LEFT HEART CATH AND CORONARY ANGIOGRAPHY;  Surgeon: Cage Deatrice LABOR, MD;  Location: [facility] INVASIVE CV LAB;  Service: Cardiovascular;  Laterality: Bilateral;   SHOULDER SURGERY Left 2017   THORACIC LAMINECTOMY FOR SPINAL CORD STIMULATOR N/A 10/27/2023   Procedure: THORACIC LAMINECTOMY FOR SPINAL CORD STIMULATOR PLACEMENT WITH INTERNAL PULSE GENERATOR IMPLANT;  Surgeon: Claudene Penne ORN, MD;  Location: [facility] ORS;  Service: Neurosurgery;  Laterality: N/A;  THORACIC LAMINECTOMY FOR SPINAL CORD STIMULATOR PLACEMENT WITH INTERNAL PULSE GENERATOR IMPLANT     Current Outpatient Medications  Medication Sig Dispense Refill   acetaminophen  (TYLENOL ) 650 MG CR tablet Take 1,300 mg by mouth daily. Takes with Gabapentin      albuterol  (VENTOLIN  HFA) 108 (90 Base) MCG/ACT inhaler Inhale 1-2 puffs into the lungs every 6 (six) hours as needed for wheezing or shortness of breath.     amLODipine  (NORVASC ) 10 MG tablet Take 1 tablet (10 mg total) by mouth daily. 90 tablet 0   aspirin  EC 81 MG tablet Take 1 tablet (81 mg total) by mouth daily. Swallow whole.     atorvastatin  (LIPITOR ) 80 MG tablet Take 1 tablet (80 mg total) by mouth daily. for cholesterol. 90 tablet 2   clobetasol  cream (TEMOVATE ) 0.05 % Apply 1 Application topically 2 (two) times daily as needed. For vaginal itching. 30 g 0   Continuous Glucose Receiver (FREESTYLE LIBRE 2 READER) DEVI Use with sensor to check blood sugars 6 times daily. 1 each 0   Continuous Glucose Sensor (FREESTYLE LIBRE 2  SENSOR) MISC USE TO CHECK BLOOD SUGAR. CHANGE EVERY 14 DAYS 6 each 0   ezetimibe  (ZETIA ) 10 MG tablet Take 1 tablet (10 mg total) by mouth daily. For cholesterol. 90 tablet 2   furosemide  (LASIX ) 20 MG tablet Take 1 tablet (20 mg total) by mouth daily as needed (As needed for weight gain greater than 3 pounds overnight or increased shortness of breath.). Take one tab only on Monday, Wednesday, & Fridays (Patient taking differently: Take 20 mg by mouth every Monday, Wednesday, and Friday. Take one tab only on Monday, Wednesday, & Fridays) 36 tablet 3   gabapentin  (NEURONTIN ) 300 MG capsule Take 1 capsule (300 mg total) by mouth daily. May take an additional dose throughout the day if needed for  pain 180 capsule 2   Glucagon  (GVOKE HYPOPEN  2-PACK) 1 MG/0.2ML SOAJ Inject 1 mg (1 pen) as needed for severe low blood sugar (sustained glucose less than 55 despite oral glucose treatments). May repeat in 15 minutes as needed. 0.4 mL 1   insulin  aspart (NOVOLOG  FLEXPEN) 100 UNIT/ML FlexPen INJECT 5 UNITS SUBCUTANEOUSLY THREE TIMES DAILY WITH MEALS PER SLIDING SCALE (FOR DIABETES) 45 mL 1   insulin  degludec (TRESIBA  FLEXTOUCH) 100 UNIT/ML FlexTouch Pen Inject 10 Units into the skin daily.     Insulin  Pen Needle (INSUPEN PEN NEEDLES) 32G X 4 MM MISC Use to inject insulin  4 times daily. 250 each 1   isosorbide  mononitrate (IMDUR ) 30 MG 24 hr tablet Take 1 tablet (30 mg total) by mouth daily. 90 tablet 3   levothyroxine  (SYNTHROID ) 100 MCG tablet TAKE 1 TABLET BY MOUTH IN THE MORNING ON  AN  EMPTY  STOMACH  WITH  WATER   ONLY.  NO  FOOD  OR  OTHER  MEDICATIONS  FOR  30  MINUTES 90 tablet 2   mirtazapine  (REMERON ) 15 MG tablet Take 1 tablet (15 mg total) by mouth at bedtime. For sleep (Patient taking differently: Take 15 mg by mouth at bedtime as needed (sleep).) 90 tablet 2   nicotine  (NICODERM CQ  - DOSED IN MG/24 HOURS) 21 mg/24hr patch Place 1 patch (21 mg total) onto the skin daily. 42 patch 0   nitroGLYCERIN   (NITROSTAT ) 0.4 MG SL tablet Place 1 tablet (0.4 mg total) under the tongue every 5 (five) minutes as needed for chest pain. 25 tablet 3   ondansetron  (ZOFRAN ) 4 MG tablet Take 1 tablet (4 mg total) by mouth every 6 (six) hours as needed for nausea. 15 tablet 0   potassium chloride  (KLOR-CON  M) 10 MEQ tablet Take 10 mEq by mouth every Monday, Wednesday, and Friday.     sitaGLIPtin  (JANUVIA ) 100 MG tablet Take 1 tablet (100 mg total) by mouth daily. for diabetes. 90 tablet 1   umeclidinium-vilanterol (ANORO ELLIPTA ) 62.5-25 MCG/ACT AEPB Inhale 1 puff into the lungs daily. 30 each 11   nicotine  polacrilex (NICOTINE  MINI) 2 MG lozenge Take 1 lozenge (2 mg total) by mouth every 2 (two) hours as needed for smoking cessation. (Patient not taking: Reported on 06/21/2024) 72 lozenge 3   No current facility-administered medications for this visit.    Allergies:   Patient has no known allergies.    Social History:  The patient  reports that she has been smoking cigarettes. She has a 45 pack-year smoking history. She has never used smokeless tobacco. She reports that she does not currently use drugs. She reports that she does not drink alcohol.   Family History:  The patient's family history includes Colon cancer in her sister; Heart attack in her mother; Heart disease in her father and mother; Liver cancer in her brother; Throat cancer in her brother.    ROS:  Please see the history of present illness.   Otherwise, review of systems are positive for none.   All other systems are reviewed and negative.    PHYSICAL EXAM: VS:  BP 130/74 (BP Location: Left Arm, Patient Position: Sitting, Cuff Size: Normal)   Pulse 83   Ht 5' 6 (1.676 m)   Wt 130 lb 4 oz (59.1 kg)   SpO2 98%   BMI 21.02 kg/m  , BMI Body mass index is 21.02 kg/m. GEN: Well nourished, well developed, in no acute distress  HEENT: normal  Neck: no JVD,  carotid bruits, or masses Cardiac: RRR; no murmurs, rubs, or gallops,no edema   Respiratory:  clear to auscultation bilaterally, normal work of breathing GI: soft, nontender, nondistended, + BS MS: no deformity or atrophy  Skin: warm and dry, no rash Neuro:  Strength and sensation are intact Psych: euthymic mood, full affect Vascular: Femoral pulses normal bilaterally.     EKG:  EKG is ordered today.  Normal sinus rhythm Left bundle branch block When compared with ECG of 09-May-2024 15:23, Vent. rate has increased BY  27 BPM Left bundle branch block is now Present Criteria for Anteroseptal infarct are no longer Present   Recent Labs: 03/24/2024: TSH 1.21 04/26/2024: B Natriuretic Peptide 262.5 05/09/2024: ALT 42; BUN 22; Creatinine, Ser 1.39; Platelets 276 05/16/2024: Hemoglobin 12.6; Potassium 3.2; Sodium 139    Lipid Panel    Component Value Date/Time   CHOL 102 03/24/2024 0919   CHOL 221 (H) 09/11/2023 1027   CHOL 292 (H) 08/02/2014 0408   TRIG 75.0 03/24/2024 0919   TRIG 388 (H) 08/02/2014 0408   HDL 43.80 03/24/2024 0919   HDL 67 09/11/2023 1027   HDL 34 (L) 08/02/2014 0408   CHOLHDL 2 03/24/2024 0919   VLDL 15.0 03/24/2024 0919   VLDL 78 (H) 08/02/2014 0408   LDLCALC 44 03/24/2024 0919   LDLCALC 134 (H) 09/11/2023 1027   LDLCALC 180 (H) 08/02/2014 0408   LDLDIRECT 52 08/28/2022 1509      Wt Readings from Last 3 Encounters:  06/21/24 130 lb 4 oz (59.1 kg)  05/25/24 129 lb 3.2 oz (58.6 kg)  05/25/24 129 lb 3.2 oz (58.6 kg)            No data to display            ASSESSMENT AND PLAN:  1.  Coronary artery disease involving native coronary arteries with other forms of angina: She continues to have significant chest pain and has multivessel disease.  Recommend proceeding with PCI at least of the LAD and likely the right coronary artery.  I discussed the procedure in details as well as risks and benefits.  She had significant radial artery spasm with discomfort and thus we will plan on femoral access. Planned access is via the  right femoral artery. I elected to start her on clopidogrel  75 mg once daily.  Continue aspirin .  2.  Peripheral arterial disease: Status post orbital atherectomy and drug-coated balloon angioplasty to the left SFA with excellent results.  No recurrent claudication.  Most recent Doppler studies showed normal ABI and velocities.  3. Hyperlipidemia: Continue treatment with rosuvastatin  and Zetia .  Most recent lipid profile showed an LDL of 44.  4. Chronic systolic heart failure: Most recent ejection fraction was 50%.  5. Essential hypertension: Blood pressure is controlled on current medications.  6. Tobacco use: She is in the process of quitting.  Informed Consent   Shared Decision Making/Informed Consent The risks [stroke (1 in 1000), death (1 in 1000), kidney failure [usually temporary] (1 in 500), bleeding (1 in 200), allergic reaction [possibly serious] (1 in 200)], benefits (diagnostic support and management of coronary artery disease) and alternatives of a cardiac catheterization were discussed in detail with Ms. Gao and she is willing to proceed.      Disposition:   Proceed with PCI next week and follow-up after.  Signed,  Deatrice Cage, MD  06/21/2024 3:32 PM    [facility]

## 2024-06-21 NOTE — Patient Instructions (Addendum)
 Medication Instructions:  START Plavix  (Clopidogrel ) 75 mg once daily  *If you need a refill on your cardiac medications before your next appointment, please call your pharmacy*  Lab Work: Your provider would like for you to have the following labs today: BMET and CBC  If you have labs (blood work) drawn today and your tests are completely normal, you will receive your results only by: MyChart Message (if you have MyChart) OR A paper copy in the mail If you have any lab test that is abnormal or we need to change your treatment, we will call you to review the results.  p: At [facility], you and your health needs are our priority.  As part of our continuing mission to provide you with exceptional heart care, our providers are all part of one team.  This team includes your primary Cardiologist (physician) and Advanced Practice Providers or APPs (Physician Assistants and Nurse Practitioners) who all work together to provide you with the care you need, when you need it.  Your next appointment:   1 month(s)  Provider:   You may see Dr. Darron or one of the following Advanced Practice Providers on your designated Care Team:   Lonni Meager, NP Lesley Maffucci, PA-C Bernardino Bring, PA-C Cadence [place], PA-C Tylene Lunch, NP Barnie Hila, NP    We recommend signing up for the patient portal called MyChart.  Sign up information is provided on this After Visit Summary.  MyChart is used to connect with patients for Virtual Visits (Telemedicine).  Patients are able to view lab/test results, encounter notes, upcoming appointments, etc.  Non-urgent messages can be sent to your provider as well.   To learn more about what you can do with MyChart, go to [email or web address].   Other Instructions  [facility] A DEPT OF [place] AT [facility] [address] [place] [address] Dept: [phone] Loc:  [phone]  Sarah Payne  06/21/2024  You are scheduled for a Cardiac Catheterization on Thursday, December 11 with Dr. Deatrice Darron.  1. Please arrive at the [facility] (Main Entrance A) at [facility]: [address] [place], [place] [address] at 5:30 AM (This time is 2 hour(s) before your procedure to ensure your preparation).   Free valet parking service is available. You will check in at ADMITTING. The support person will be asked to wait in the waiting room.  It is OK to have someone drop you off and come back when you are ready to be discharged.    Special note: Every effort is made to have your procedure done on time. Please understand that emergencies sometimes delay scheduled procedures.  2. Diet: Nothing to eat after midnight.   3. Hydration: You need to be well hydrated before your procedure. On December 11, you may drink approved liquids (see below) until 2 hours before the procedure, with 16 oz of water  as your last intake.   List of approved liquids water , clear juice, clear tea, black coffee, fruit juices, non-citric and without pulp, carbonated beverages, Gatorade, Kool -Aid, plain Jello-O and plain ice popsicles.  4. Labs: You will need to have blood drawn on 06/21/24 You do not need to be fasting.  5. Medication instructions in preparation for your procedure: Hold the Furosemide  the morning of Hold all diabetic medication the morning of the procedure:  -take half the dose of the Tresiba  the night before  On the morning of  your procedure, take your Aspirin  81 mg and Plavix /Clopidogrel  and any morning medicines NOT listed above.  You may use sips of water .  6. Plan to go home the same day, you will only stay overnight if medically necessary. 7. Bring a current list of your medications and current insurance cards. 8. You MUST have a responsible person to drive you home. 9. Someone MUST be with you the first 24 hours after you arrive home or your discharge  will be delayed. 10. Please wear clothes that are easy to get on and off and wear slip-on shoes.  Thank you for allowing us  to care for you!   -- [facility]

## 2024-06-22 LAB — CBC
Hematocrit: 43.5 % (ref 34.0–46.6)
Hemoglobin: 13.9 g/dL (ref 11.1–15.9)
MCH: 29.8 pg (ref 26.6–33.0)
MCHC: 32 g/dL (ref 31.5–35.7)
MCV: 93 fL (ref 79–97)
Platelets: 290 x10E3/uL (ref 150–450)
RBC: 4.67 x10E6/uL (ref 3.77–5.28)
RDW: 13.2 % (ref 11.7–15.4)
WBC: 9.6 x10E3/uL (ref 3.4–10.8)

## 2024-06-22 LAB — BASIC METABOLIC PANEL WITH GFR
BUN/Creatinine Ratio: 11 — ABNORMAL LOW (ref 12–28)
BUN: 17 mg/dL (ref 8–27)
CO2: 26 mmol/L (ref 20–29)
Calcium: 9.6 mg/dL (ref 8.7–10.3)
Chloride: 97 mmol/L (ref 96–106)
Creatinine, Ser: 1.49 mg/dL — ABNORMAL HIGH (ref 0.57–1.00)
Glucose: 176 mg/dL — ABNORMAL HIGH (ref 70–99)
Potassium: 3.6 mmol/L (ref 3.5–5.2)
Sodium: 138 mmol/L (ref 134–144)
eGFR: 36 mL/min/1.73 — ABNORMAL LOW (ref >59)

## 2024-06-24 ENCOUNTER — Ambulatory Visit: Admitting: Primary Care

## 2024-06-24 ENCOUNTER — Ambulatory Visit: Payer: Self-pay | Admitting: Cardiovascular Disease

## 2024-06-24 ENCOUNTER — Encounter: Payer: Self-pay | Admitting: Primary Care

## 2024-06-24 ENCOUNTER — Ambulatory Visit: Payer: Self-pay | Admitting: Primary Care

## 2024-06-24 VITALS — BP 138/80 | HR 80 | Temp 97.2°F | Ht 66.0 in | Wt 131.0 lb

## 2024-06-24 DIAGNOSIS — E1151 Type 2 diabetes mellitus with diabetic peripheral angiopathy without gangrene: Secondary | ICD-10-CM

## 2024-06-24 DIAGNOSIS — Z794 Long term (current) use of insulin: Secondary | ICD-10-CM | POA: Diagnosis not present

## 2024-06-24 LAB — POCT GLYCOSYLATED HEMOGLOBIN (HGB A1C): Hemoglobin A1C: 8 % — AB (ref 4.0–5.6)

## 2024-06-24 MED ORDER — NOVOLOG FLEXPEN RELION 100 UNIT/ML ~~LOC~~ SOPN: PEN_INJECTOR | SUBCUTANEOUS | 1 refills | Status: AC

## 2024-06-24 MED ORDER — FREESTYLE LIBRE 2 SENSOR MISC: 3 refills | Status: DC

## 2024-06-24 NOTE — Progress Notes (Signed)
 Subjective:    Patient ID: Sarah Payne, female    DOB: 01/21/1946, 78 y.o.   MRN: [id number]  [name] is a very pleasant 78 y.o. female with a history of CAD, CHF, cardiomyopathy, type 2 diabetes, chronic bronchitis, OSA, hypothyroidism, CKD, chronic back pain who presents today for follow-up of diabetes.  1) Type 2 Diabetes:  Current medications include: Januvia  100 mg daily, Tresiba  10 units daily, NovoLog  8-10 units 3 times daily per sliding scale with meals.  She follows closely with our pharmacy team, last evaluated on 04/27/2024.  Tresiba  initiated at 5 units on 04/01/2024, increased to 8 units 04/06/2024 but patient increased to 10 units.  She stopped taking Tresiba  after one week of use because she was seeing readings consistently in the 400s. She is compliant to all other medications.   She is checking her blood glucose continuously and is getting readings ranging 160-320s.   Time in target over the last 7 days is 37% Time in target over the last 14 days is 45% Time in target over the last 30 days is 51% 2 hypoglycemic episodes in 30 days  Last A1C: 9.2 in September 2025; 8.0 today Last Eye Exam: Due Last Foot Exam: Due Pneumonia Vaccination: UTD Urine Microalbumin: UTD Statin: Atorvastatin   Dietary changes since last visit: She quit smoking. She is eating more during the day since she stopped    Exercise: None     Review of Systems  Respiratory:  Positive for shortness of breath.   Cardiovascular:  Negative for chest pain.  Neurological:  Positive for numbness.         Past Medical History:  Diagnosis Date   Acute pain of left shoulder 01/17/2019   Aortic atherosclerosis    CHF (congestive heart failure) (HCC) 09/16/2013   a.) TTE 09/16/2013: EF 55-60%; mod inferior HK, triv TR; G1DD. b.) TTE 01/02/2016: EF 25-30%; mid-apicalateroseptal, lat, inf, inferoseptal, apical akinesis; triv TR; G1DD. c. TTE 04/16/2016: EF 65%; G1DD. d.) TTE 05/02/2020: EF  50-55%; G2DD. e.) TTE 04/11/2021: EF 55%; G1DD.   Chronic low back pain with bilateral sciatica    Chronic painful diabetic neuropathy (HCC)    Chronic radicular lumbar pain    CKD (chronic kidney disease), stage III (HCC)    Clostridioides difficile infection 2015   COPD (chronic obstructive pulmonary disease) (HCC)    Coronary artery disease    a.) PCI 02/06/2011: 100% mLCx - DES x 3. b.) PCI 09/17/2011: 95% dRCA - DES x 1. c.) PCI 10/02/2011: 75% pLCX - DES x 1. d.) PCI 08/19/2012: DES x 2 p-mRCA.  e.) LHC 01/01/16: Takotsubo event 01/01/2016 with patent stents   Depression    Diverticulosis    Failed back syndrome    Frequent PVCs    a. Noted in hospital 12/2015.   GERD (gastroesophageal reflux disease)    History of bilateral cataract extraction 01/2015   History of heart artery stent    a.) TOTAL of 7 stents --> 02/06/2011 - overlapping 2.5 x 12 mm Xience V, 2.5 x 8 mm Xience V, 2.25 x 8 mm Xience Nano to mLCx; 09/17/2011 - 2.5 x 23 Xience V dRCA; 10/02/2011 - 2.5 x 15 mm Xience V pLCx; 08/09/2012 - overlapping 3.0 x 38mm mRCA and 3.5 x 23mm pRCA   Hyperlipidemia    Hypertension    Hypothyroidism    Impingement syndrome of left shoulder    LBBB (left bundle branch block)    Long term current use  of clopidogrel     Marijuana abuse    Myocardial infarction [facility])    a.) multiple MIs;  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 12/2015 - nephew committed suicide 1 week prior, sister died the morning of presentation - initially called a STEMI; cath with patent stents.  LVEF 25-30%.   Tendonitis of left rotator cuff    Tobacco abuse    Vaginal burning 03/22/2019   Vascular dementia     Social History   Socioeconomic History   Marital status: Married    Spouse name: Laurier   Number of children: Not on file   Years of education: Not on file   Highest education level: Not on file  Occupational History   Not on file  Tobacco Use   Smoking status: Every Day    Current packs/day: 1.00    Average packs/day: 1 pack/day for 45.0 years (45.0 ttl pk-yrs)    Types: Cigarettes   Smokeless tobacco: Never   Tobacco comments:    Has cut back, trying to quit. 1 pack/day per patient.  Vaping Use   Vaping status: Some Days   Substances: Nicotine   Substance and Sexual Activity   Alcohol use: No    Alcohol/week: 0.0 standard drinks of alcohol   Drug use: Not Currently   Sexual activity: Not on file  Other Topics Concern   Not on file  Social History Narrative   Lives at home with her husband in [place].  Previously used marijuana - quit.      Regular exercise: no/ pain from a frozen rotator cuff   Caffeine use: coffee daily and pepsi      Does not have a living will.   Daughters and husband know her wishes- would desire CPR but not prolonged life support if futile   Social Drivers of Corporate Investment Banker Strain: Low Risk  (10/22/2023)   Overall Financial Resource Strain (CARDIA)    Difficulty of Paying Living Expenses: Not hard at all  Food Insecurity: No Food Insecurity (01/27/2024)   Hunger Vital Sign    Worried About Running Out of Food in the Last Year: Never true    Ran Out of Food in the Last Year: Never true  Transportation Needs: No Transportation Needs (01/27/2024)   PRAPARE - Administrator, Civil Service (Medical): No    Lack of Transportation (Non-Medical): No  Physical Activity: Insufficiently Active (10/22/2023)   Exercise Vital Sign    Days of Exercise per Week: 3 days    Minutes of Exercise per Session: 20 min   Stress: No Stress Concern Present (10/22/2023)   [employer] of Occupational Health - Occupational Stress Questionnaire    Feeling of Stress : Only a little  Social Connections: Moderately Isolated (10/22/2023)   Social Connection and Isolation Panel    Frequency of Communication with Friends and Family: Twice a week    Frequency of Social Gatherings with Friends and Family: Once  a week    Attends Religious Services: Never    Active Member of Clubs or Organizations: No    Attends Banker Meetings: Never    Marital Status: Married  Catering Manager Violence: Unknown (01/27/2024)   Humiliation, Afraid, Rape, and Kick questionnaire    Fear of Current or Ex-Partner: No    Emotionally Abused: No    Physically Abused: Not on file    Sexually Abused: No    Past Surgical History:  Procedure Laterality Date   ABDOMINAL AORTOGRAM W/LOWER EXTREMITY N/A 09/12/2020   Procedure: ABDOMINAL AORTOGRAM W/LOWER EXTREMITY;  Surgeon: Darron Deatrice LABOR, MD;  Location: [facility] INVASIVE CV LAB;  Service: Cardiovascular;  Laterality: N/A;   ABDOMINAL HYSTERECTOMY     APPENDECTOMY     BACK SURGERY     CARDIAC CATHETERIZATION N/A 01/01/2016   Procedure: Left Heart Cath and Coronary Angiography;  Surgeon: Candyce GORMAN Reek, MD;  Location: [facility] INVASIVE CV LAB;  Service: Cardiovascular;  Laterality: N/A;   CATARACT EXTRACTION W/PHACO Left 01/30/2015   Procedure: CATARACT EXTRACTION PHACO AND INTRAOCULAR LENS PLACEMENT (IOC);  Surgeon: Elsie Carmine, MD;  Location: [facility] ORS;  Service: Ophthalmology;  Laterality: Left;  US  00:47    CATARACT EXTRACTION W/PHACO Right 02/13/2015   Procedure: CATARACT EXTRACTION PHACO AND INTRAOCULAR LENS PLACEMENT (IOC);  Surgeon: Elsie Carmine, MD;  Location: [facility] ORS;  Service: Ophthalmology;  Laterality: Right;  cassette lot # [id number] H US   00:29.9 AP  20.7 CDE  6.20   COLONOSCOPY N/A 11/02/2014   Procedure: COLONOSCOPY;  Surgeon: Lamar JONETTA Aho, MD;  Location: [facility]  ENDOSCOPY;  Service: Endoscopy;  Laterality: N/A;   CORONARY ANGIOPLASTY WITH STENT PLACEMENT Left 02/06/2011   Procedure: CORONARY ANGIOPLASTY WITH STENT PLACEMENT; Location: [facility]; Surgeon: Margie Lovelace, MD   CORONARY ANGIOPLASTY WITH STENT PLACEMENT Left 09/17/2011   Procedure: CORONARY ANGIOPLASTY WITH STENT PLACEMENT; Location: [facility]; Surgeon: Margie Lovelace, MD   CORONARY ANGIOPLASTY WITH STENT PLACEMENT Left 10/02/2011   Procedure: CORONARY ANGIOPLASTY WITH STENT PLACEMENT; Location: [facility]; Surgeon: Margie Lovelace, MD   CORONARY ANGIOPLASTY WITH STENT PLACEMENT Left 08/19/2012   Procedure: CORONARY ANGIOPLASTY WITH STENT PLACEMENT; Location: [facility]; Surgeon: Deatrice Darron, MD   ESOPHAGOGASTRODUODENOSCOPY (EGD) WITH PROPOFOL  N/A 09/21/2018   Procedure: ESOPHAGOGASTRODUODENOSCOPY (EGD) WITH PROPOFOL ;  Surgeon: Therisa Bi, MD;  Location: [facility] ENDOSCOPY;  Service: Gastroenterology;  Laterality: N/A;   ESOPHAGOGASTRODUODENOSCOPY (EGD) WITH PROPOFOL  N/A 10/06/2022   Procedure: ESOPHAGOGASTRODUODENOSCOPY (EGD) WITH PROPOFOL ;  Surgeon: Therisa Bi, MD;  Location: [facility] ENDOSCOPY;  Service: Gastroenterology;  Laterality: N/A;   EYE SURGERY     LEFT HEART CATH AND CORONARY ANGIOGRAPHY Left 08/02/2014   Procedure: LEFT HEART CATH AND CORONARY ANGIOGRAPHY; Location: [facility]; Surgeon: Alm Clay, MD   PERIPHERAL VASCULAR ATHERECTOMY Left 09/12/2020   Procedure: PERIPHERAL VASCULAR ATHERECTOMY;  Surgeon: Darron Deatrice LABOR, MD;  [facility] INVASIVE CV LAB;  Service: Cardiovascular;  Laterality: Left;   PERIPHERAL VASCULAR BALLOON ANGIOPLASTY  09/12/2020   Procedure: PERIPHERAL VASCULAR BALLOON ANGIOPLASTY;  Surgeon: Darron Deatrice LABOR, MD;  Location: [facility] INVASIVE CV LAB;  Service: Cardiovascular;;   REVERSE SHOULDER ARTHROPLASTY Left 10/01/2021   Procedure: REVERSE SHOULDER ARTHROPLASTY WITH BICEPS TENODESIS.;  Surgeon: Edie Norleen PARAS, MD;  Location: [facility] ORS;  Service: Orthopedics;  Laterality:  Left;   RIGHT/LEFT HEART CATH AND CORONARY ANGIOGRAPHY Bilateral 05/16/2024   Procedure: RIGHT/LEFT HEART CATH AND CORONARY ANGIOGRAPHY;  Surgeon: Darron Deatrice LABOR, MD;  Location: [facility] INVASIVE CV LAB;  Service: Cardiovascular;  Laterality: Bilateral;   SHOULDER SURGERY Left  2017   THORACIC LAMINECTOMY FOR SPINAL CORD STIMULATOR N/A 10/27/2023   Procedure: THORACIC LAMINECTOMY FOR SPINAL CORD STIMULATOR PLACEMENT WITH INTERNAL PULSE GENERATOR IMPLANT;  Surgeon: Claudene Penne ORN, MD;  Location: [facility] ORS;  Service: Neurosurgery;  Laterality: N/A;  THORACIC LAMINECTOMY FOR SPINAL CORD STIMULATOR PLACEMENT WITH INTERNAL PULSE GENERATOR IMPLANT    Family History  Problem Relation Age of Onset   Heart attack Mother        First MI @ 52 - Died @ 80   Heart disease Mother    Heart disease Father        Died @ 13   Throat cancer Brother    Liver cancer Brother    Colon cancer Sister     No Known Allergies  Current Outpatient Medications on File Prior to Visit  Medication Sig Dispense Refill   acetaminophen  (TYLENOL ) 650 MG CR tablet Take 1,300 mg by mouth daily. Takes with Gabapentin      albuterol  (VENTOLIN  HFA) 108 (90 Base) MCG/ACT inhaler Inhale 1-2 puffs into the lungs every 6 (six) hours as needed for wheezing or shortness of breath.     amLODipine  (NORVASC ) 10 MG tablet Take 1 tablet (10 mg total) by mouth daily. 90 tablet 0   aspirin  EC 81 MG tablet Take 1 tablet (81 mg total) by mouth daily. Swallow whole.     atorvastatin  (LIPITOR ) 80 MG tablet Take 1 tablet (80 mg total) by mouth daily. for cholesterol. 90 tablet 2   clobetasol  cream (TEMOVATE ) 0.05 % Apply 1 Application topically 2 (two) times daily as needed. For vaginal itching. 30 g 0   clopidogrel  (PLAVIX ) 75 MG tablet Take 1 tablet (75 mg total) by mouth daily. 90 tablet 3   Continuous Glucose Receiver (FREESTYLE LIBRE 2 READER) DEVI Use with sensor to check blood sugars 6 times daily. 1 each 0   ezetimibe  (ZETIA ) 10 MG tablet  Take 1 tablet (10 mg total) by mouth daily. For cholesterol. 90 tablet 2   furosemide  (LASIX ) 20 MG tablet Take 1 tablet (20 mg total) by mouth daily as needed (As needed for weight gain greater than 3 pounds overnight or increased shortness of breath.). Take one tab only on Monday, Wednesday, & Fridays (Patient taking differently: Take 20 mg by mouth every Monday, Wednesday, and Friday. Take one tab only on Monday, Wednesday, & Fridays) 36 tablet 3   gabapentin  (NEURONTIN ) 300 MG capsule Take 1 capsule (300 mg total) by mouth daily. May take an additional dose throughout the day if needed for pain 180 capsule 2   Glucagon  (GVOKE HYPOPEN  2-PACK) 1 MG/0.2ML SOAJ Inject 1 mg (1 pen) as needed for severe low blood sugar (sustained glucose less than 55 despite oral glucose treatments). May repeat in 15 minutes as needed. 0.4 mL 1   insulin  degludec (TRESIBA  FLEXTOUCH) 100 UNIT/ML FlexTouch Pen Inject 10 Units into the skin daily.     Insulin  Pen Needle (INSUPEN PEN NEEDLES) 32G X 4 MM MISC Use to inject insulin  4 times daily. 250 each 1   isosorbide  mononitrate (IMDUR ) 30 MG 24 hr tablet Take 1 tablet (30 mg total) by mouth daily. 90 tablet 3   levothyroxine  (SYNTHROID ) 100 MCG tablet TAKE 1 TABLET BY MOUTH IN THE MORNING ON  AN  EMPTY  STOMACH  WITH  WATER   ONLY.  NO  FOOD  OR  OTHER  MEDICATIONS  FOR  30  MINUTES 90 tablet 2   mirtazapine  (REMERON ) 15 MG tablet Take 1 tablet (  15 mg total) by mouth at bedtime. For sleep (Patient taking differently: Take 15 mg by mouth at bedtime as needed (sleep).) 90 tablet 2   nicotine  (NICODERM CQ  - DOSED IN MG/24 HOURS) 21 mg/24hr patch Place 1 patch (21 mg total) onto the skin daily. 42 patch 0   nicotine  polacrilex (NICOTINE  MINI) 2 MG lozenge Take 1 lozenge (2 mg total) by mouth every 2 (two) hours as needed for smoking cessation. 72 lozenge 3   nitroGLYCERIN  (NITROSTAT ) 0.4 MG SL tablet Place 1 tablet (0.4 mg total) under the tongue every 5 (five) minutes as needed  for chest pain. 25 tablet 3   ondansetron  (ZOFRAN ) 4 MG tablet Take 1 tablet (4 mg total) by mouth every 6 (six) hours as needed for nausea. 15 tablet 0   potassium chloride  (KLOR-CON  M) 10 MEQ tablet Take 10 mEq by mouth every Monday, Wednesday, and Friday.     sitaGLIPtin  (JANUVIA ) 100 MG tablet Take 1 tablet (100 mg total) by mouth daily. for diabetes. 90 tablet 1   umeclidinium-vilanterol (ANORO ELLIPTA ) 62.5-25 MCG/ACT AEPB Inhale 1 puff into the lungs daily. 30 each 11   No current facility-administered medications on file prior to visit.    BP 138/80   Pulse 80   Temp (!) 97.2 F (36.2 C) (Temporal)   Ht 5' 6 (1.676 m)   Wt 131 lb (59.4 kg)   SpO2 96%   BMI 21.14 kg/m  Objective:   Physical Exam Cardiovascular:     Rate and Rhythm: Normal rate and regular rhythm.  Pulmonary:     Effort: Pulmonary effort is normal.     Breath sounds: Normal breath sounds.  Musculoskeletal:     Cervical back: Neck supple.  Skin:    General: Skin is warm and dry.  Neurological:     Mental Status: She is alert and oriented to person, place, and time.  Psychiatric:        Mood and Affect: Mood normal.     Physical Exam        Assessment & Plan:  Diabetes mellitus type 2 with peripheral artery disease (HCC) Assessment & Plan: Improved with A1C of 8.0 today, would like to see <7.5.   I expect glucose levels to increase as she has discontinued her Tresiba , we see this during her last 7 days on CGM.  We discussed that Tresiba  is likely not the cause of her hyperglycemia, rather her increased appetite from smoking cessation.  Recommended that she resume Tresiba  at 5 units daily. Continue NovoLog  sliding scale 8 to 10 units. Continue Januvia  100 mg daily.  Foot exam today. Follow-up in 3 months  Will also have pharmacy team follow.  Orders: -     POCT glycosylated hemoglobin (Hb A1C) -     NovoLOG  FlexPen ReliOn; INJECT 5 UNITS SUBCUTANEOUSLY THREE TIMES DAILY WITH MEALS PER  SLIDING SCALE (FOR DIABETES)  Dispense: 45 mL; Refill: 1 -     FreeStyle Libre 2 Sensor; USE TO CHECK BLOOD SUGAR CHANGE EVERY 14 DAYS  Dispense: 6 each; Refill: 3    Assessment and Plan Assessment & Plan         Comer MARLA Gaskins, NP

## 2024-06-24 NOTE — Assessment & Plan Note (Addendum)
 Improved with A1C of 8.0 today, would like to see <7.5.   I expect glucose levels to increase as she has discontinued her Tresiba , we see this during her last 7 days on CGM.  We discussed that Tresiba  is likely not the cause of her hyperglycemia, rather her increased appetite from smoking cessation.  Recommended that she resume Tresiba  at 5 units daily. Continue NovoLog  sliding scale 8 to 10 units. Continue Januvia  100 mg daily.  Foot exam today. Follow-up in 3 months  Will also have pharmacy team follow.

## 2024-06-24 NOTE — Patient Instructions (Signed)
 Resume Tresiba  at 5 units daily.  Continue NovoLog  with meals.  We will see you back in 3 months for follow-up as scheduled.  It was a pleasure to see you today!

## 2024-06-28 ENCOUNTER — Telehealth: Payer: Self-pay | Admitting: *Deleted

## 2024-06-28 NOTE — Telephone Encounter (Signed)
 Cardiac Catheterization scheduled at [facility] for: Thursday June 30, 2024 10:30 AM Arrival time [facility] Main Entrance A at: 5:30 AM-pre-procedure hydration   Diet: -Nothing to eat after midnight.  Hydration: -May drink clear liquids until 2 hours before the procedure.  Approved liquids: Water , clear tea, black coffee, fruit juices-non-citric and without pulp,Gatorade, plain Jello/popsicles.  Medication instructions: -Hold:  Insulin /Januvia -AM of procedure-pt tells me she does not take Insulin  HS  Lasix /KCl-day before and day of procedure-eGFR < 60 (36) -Other usual morning medications can be taken including aspirin  81 mg and Plavix  75 mg.  Plan to go home the same day, you will only stay overnight if medically necessary.  You must have responsible adult to drive you home.  Someone must be with you the first 24 hours after you arrive home.  Reviewed procedure instructions/pre-procedure hydration with patient.

## 2024-06-30 ENCOUNTER — Other Ambulatory Visit: Payer: Self-pay

## 2024-06-30 ENCOUNTER — Ambulatory Visit (HOSPITAL_COMMUNITY)
Admission: RE | Admit: 2024-06-30 | Discharge: 2024-07-01 | Disposition: A | Attending: Cardiovascular Disease | Admitting: Cardiovascular Disease

## 2024-06-30 ENCOUNTER — Encounter (HOSPITAL_COMMUNITY): Payer: Self-pay | Admitting: Cardiovascular Disease

## 2024-06-30 ENCOUNTER — Encounter (HOSPITAL_COMMUNITY): Admission: RE | Disposition: A | Payer: Self-pay | Attending: Cardiovascular Disease

## 2024-06-30 DIAGNOSIS — I2089 Other forms of angina pectoris: Secondary | ICD-10-CM | POA: Diagnosis present

## 2024-06-30 DIAGNOSIS — Z955 Presence of coronary angioplasty implant and graft: Secondary | ICD-10-CM

## 2024-06-30 DIAGNOSIS — E1151 Type 2 diabetes mellitus with diabetic peripheral angiopathy without gangrene: Secondary | ICD-10-CM | POA: Insufficient documentation

## 2024-06-30 DIAGNOSIS — I130 Hypertensive heart and chronic kidney disease with heart failure and stage 1 through stage 4 chronic kidney disease, or unspecified chronic kidney disease: Secondary | ICD-10-CM | POA: Insufficient documentation

## 2024-06-30 DIAGNOSIS — I25118 Atherosclerotic heart disease of native coronary artery with other forms of angina pectoris: Secondary | ICD-10-CM | POA: Insufficient documentation

## 2024-06-30 DIAGNOSIS — Z794 Long term (current) use of insulin: Secondary | ICD-10-CM | POA: Insufficient documentation

## 2024-06-30 DIAGNOSIS — I2510 Atherosclerotic heart disease of native coronary artery without angina pectoris: Secondary | ICD-10-CM | POA: Diagnosis not present

## 2024-06-30 DIAGNOSIS — I2584 Coronary atherosclerosis due to calcified coronary lesion: Secondary | ICD-10-CM | POA: Insufficient documentation

## 2024-06-30 DIAGNOSIS — Z7984 Long term (current) use of oral hypoglycemic drugs: Secondary | ICD-10-CM | POA: Insufficient documentation

## 2024-06-30 DIAGNOSIS — I493 Ventricular premature depolarization: Secondary | ICD-10-CM | POA: Diagnosis present

## 2024-06-30 DIAGNOSIS — Z79899 Other long term (current) drug therapy: Secondary | ICD-10-CM | POA: Insufficient documentation

## 2024-06-30 DIAGNOSIS — F17210 Nicotine dependence, cigarettes, uncomplicated: Secondary | ICD-10-CM | POA: Insufficient documentation

## 2024-06-30 DIAGNOSIS — N183 Chronic kidney disease, stage 3 unspecified: Secondary | ICD-10-CM | POA: Insufficient documentation

## 2024-06-30 DIAGNOSIS — E785 Hyperlipidemia, unspecified: Secondary | ICD-10-CM | POA: Insufficient documentation

## 2024-06-30 DIAGNOSIS — I5022 Chronic systolic (congestive) heart failure: Secondary | ICD-10-CM | POA: Insufficient documentation

## 2024-06-30 DIAGNOSIS — Z7982 Long term (current) use of aspirin: Secondary | ICD-10-CM | POA: Insufficient documentation

## 2024-06-30 DIAGNOSIS — E1122 Type 2 diabetes mellitus with diabetic chronic kidney disease: Secondary | ICD-10-CM | POA: Insufficient documentation

## 2024-06-30 HISTORY — PX: CORONARY IMAGING/OCT: CATH118326

## 2024-06-30 HISTORY — PX: CORONARY STENT INTERVENTION: CATH118234

## 2024-06-30 HISTORY — PX: CORONARY LITHOTRIPSY: CATH118330

## 2024-06-30 LAB — GLUCOSE, CAPILLARY
Glucose-Capillary: 191 mg/dL — ABNORMAL HIGH (ref 70–99)
Glucose-Capillary: 236 mg/dL — ABNORMAL HIGH (ref 70–99)
Glucose-Capillary: 210 mg/dL — ABNORMAL HIGH (ref 70–99)

## 2024-06-30 LAB — POCT ACTIVATED CLOTTING TIME
Activated Clotting Time: 353 s
Activated Clotting Time: 245 s
Activated Clotting Time: 276 s
Activated Clotting Time: 527 s
Activated Clotting Time: 430 s

## 2024-06-30 MED ORDER — LEVOTHYROXINE SODIUM 100 MCG PO TABS
100.0000 ug | ORAL_TABLET | Freq: Every day | ORAL | Status: DC
  Administered 2024-07-01: 100 ug via ORAL
  Filled 2024-06-30: qty 1

## 2024-06-30 MED ORDER — LIDOCAINE HCL (PF) 1 % IJ SOLN
INTRAMUSCULAR | Status: AC
  Filled 2024-06-30: qty 30

## 2024-06-30 MED ORDER — HEPARIN SODIUM (PORCINE) 1000 UNIT/ML IJ SOLN
INTRAMUSCULAR | Status: AC
  Filled 2024-06-30: qty 10

## 2024-06-30 MED ORDER — ASPIRIN 81 MG PO TBEC
81.0000 mg | DELAYED_RELEASE_TABLET | Freq: Every day | ORAL | Status: DC
  Administered 2024-07-01: 81 mg via ORAL
  Filled 2024-06-30: qty 1

## 2024-06-30 MED ORDER — SODIUM CHLORIDE 0.9 % IV SOLN: 250.0000 mL | INTRAVENOUS | Status: DC | PRN

## 2024-06-30 MED ORDER — MIDAZOLAM HCL 2 MG/2ML IJ SOLN
INTRAMUSCULAR | Status: AC
  Filled 2024-06-30: qty 2

## 2024-06-30 MED ORDER — IOHEXOL 350 MG/ML SOLN
INTRAVENOUS | Status: DC | PRN
  Administered 2024-06-30: 180 mL

## 2024-06-30 MED ORDER — ACETAMINOPHEN 325 MG PO TABS
650.0000 mg | ORAL_TABLET | ORAL | Status: DC | PRN
  Administered 2024-07-01: 650 mg via ORAL
  Filled 2024-06-30: qty 2

## 2024-06-30 MED ORDER — UMECLIDINIUM-VILANTEROL 62.5-25 MCG/ACT IN AEPB
1.0000 | INHALATION_SPRAY | Freq: Every day | RESPIRATORY_TRACT | Status: DC
  Administered 2024-07-01: 1 via RESPIRATORY_TRACT
  Filled 2024-06-30: qty 14

## 2024-06-30 MED ORDER — SODIUM CHLORIDE 0.9 % WEIGHT BASED INFUSION
1.0000 mL/kg/h | INTRAVENOUS | Status: AC
  Administered 2024-06-30: 1 mL/kg/h via INTRAVENOUS

## 2024-06-30 MED ORDER — INSULIN ASPART 100 UNIT/ML IJ SOLN
0.0000 [IU] | INTRAMUSCULAR | Status: DC
  Administered 2024-06-30: 5 [IU] via SUBCUTANEOUS
  Administered 2024-07-01: 2 [IU] via SUBCUTANEOUS
  Administered 2024-07-01: 3 [IU] via SUBCUTANEOUS
  Filled 2024-06-30: qty 5
  Filled 2024-06-30: qty 2
  Filled 2024-06-30: qty 3

## 2024-06-30 MED ORDER — NITROGLYCERIN 1 MG/10 ML FOR IR/CATH LAB
INTRA_ARTERIAL | Status: AC
  Filled 2024-06-30: qty 10

## 2024-06-30 MED ORDER — NITROGLYCERIN 1 MG/10 ML FOR IR/CATH LAB
INTRA_ARTERIAL | Status: DC | PRN
  Administered 2024-06-30 (×2): 200 ug
  Administered 2024-06-30: 300 ug
  Administered 2024-06-30 (×2): 200 ug

## 2024-06-30 MED ORDER — FENTANYL CITRATE (PF) 100 MCG/2ML IJ SOLN
INTRAMUSCULAR | Status: DC | PRN
  Administered 2024-06-30: 25 ug via INTRAVENOUS
  Administered 2024-06-30: 50 ug via INTRAVENOUS

## 2024-06-30 MED ORDER — SODIUM CHLORIDE 0.9% FLUSH: 3.0000 mL | INTRAVENOUS | Status: DC | PRN

## 2024-06-30 MED ORDER — HEPARIN SODIUM (PORCINE) 1000 UNIT/ML IJ SOLN
INTRAMUSCULAR | Status: DC | PRN
  Administered 2024-06-30: 6000 [IU] via INTRAVENOUS
  Administered 2024-06-30: 3000 [IU] via INTRAVENOUS
  Administered 2024-06-30: 2000 [IU] via INTRAVENOUS

## 2024-06-30 MED ORDER — ONDANSETRON HCL 4 MG/2ML IJ SOLN
4.0000 mg | Freq: Four times a day (QID) | INTRAMUSCULAR | Status: DC | PRN
  Administered 2024-06-30 – 2024-07-01 (×2): 4 mg via INTRAVENOUS
  Filled 2024-06-30 (×2): qty 2

## 2024-06-30 MED ORDER — SODIUM CHLORIDE 0.9 % IV SOLN: INTRAVENOUS | Status: DC

## 2024-06-30 MED ORDER — AMLODIPINE BESYLATE 10 MG PO TABS
10.0000 mg | ORAL_TABLET | Freq: Every day | ORAL | Status: DC
  Administered 2024-06-30 – 2024-07-01 (×2): 10 mg via ORAL
  Filled 2024-06-30 (×2): qty 1

## 2024-06-30 MED ORDER — MIDAZOLAM HCL (PF) 2 MG/2ML IJ SOLN
INTRAMUSCULAR | Status: DC | PRN
  Administered 2024-06-30 (×2): 1 mg via INTRAVENOUS

## 2024-06-30 MED ORDER — HEPARIN (PORCINE) IN NACL 1000-0.9 UT/500ML-% IV SOLN
INTRAVENOUS | Status: DC | PRN
  Administered 2024-06-30: 500 mL
  Administered 2024-06-30: 1000 mL

## 2024-06-30 MED ORDER — ISOSORBIDE MONONITRATE ER 30 MG PO TB24
30.0000 mg | ORAL_TABLET | Freq: Every day | ORAL | Status: DC
  Administered 2024-06-30 – 2024-07-01 (×2): 30 mg via ORAL
  Filled 2024-06-30 (×2): qty 1

## 2024-06-30 MED ORDER — ASPIRIN 81 MG PO CHEW: 81.0000 mg | CHEWABLE_TABLET | ORAL | Status: DC

## 2024-06-30 MED ORDER — CLOPIDOGREL BISULFATE 75 MG PO TABS
75.0000 mg | ORAL_TABLET | Freq: Every day | ORAL | Status: DC
  Administered 2024-07-01: 75 mg via ORAL
  Filled 2024-06-30: qty 1

## 2024-06-30 MED ORDER — ALBUTEROL SULFATE (2.5 MG/3ML) 0.083% IN NEBU: 3.0000 mL | INHALATION_SOLUTION | Freq: Four times a day (QID) | RESPIRATORY_TRACT | Status: DC | PRN

## 2024-06-30 MED ORDER — NITROGLYCERIN 0.4 MG SL SUBL: 0.4000 mg | SUBLINGUAL_TABLET | SUBLINGUAL | Status: DC | PRN

## 2024-06-30 MED ORDER — GABAPENTIN 300 MG PO CAPS
300.0000 mg | ORAL_CAPSULE | Freq: Every day | ORAL | Status: DC | PRN
  Administered 2024-06-30: 300 mg via ORAL
  Filled 2024-06-30: qty 1

## 2024-06-30 MED ORDER — LIDOCAINE HCL (PF) 1 % IJ SOLN
INTRAMUSCULAR | Status: DC | PRN
  Administered 2024-06-30 (×2): 5 mL via INTRADERMAL

## 2024-06-30 MED ORDER — EZETIMIBE 10 MG PO TABS
10.0000 mg | ORAL_TABLET | Freq: Every day | ORAL | Status: DC
  Administered 2024-06-30 – 2024-07-01 (×2): 10 mg via ORAL
  Filled 2024-06-30 (×2): qty 1

## 2024-06-30 MED ORDER — MIRTAZAPINE 15 MG PO TABS: 15.0000 mg | ORAL_TABLET | Freq: Every evening | ORAL | Status: DC | PRN

## 2024-06-30 MED ORDER — FENTANYL CITRATE (PF) 100 MCG/2ML IJ SOLN
INTRAMUSCULAR | Status: AC
  Filled 2024-06-30: qty 2

## 2024-06-30 MED ORDER — ATORVASTATIN CALCIUM 80 MG PO TABS
80.0000 mg | ORAL_TABLET | Freq: Every day | ORAL | Status: DC
  Administered 2024-06-30 – 2024-07-01 (×2): 80 mg via ORAL
  Filled 2024-06-30 (×2): qty 1

## 2024-06-30 MED ORDER — CLOPIDOGREL BISULFATE 300 MG PO TABS
ORAL_TABLET | ORAL | Status: AC
  Filled 2024-06-30: qty 1

## 2024-06-30 MED ORDER — SODIUM CHLORIDE 0.9% FLUSH
3.0000 mL | Freq: Two times a day (BID) | INTRAVENOUS | Status: DC
  Administered 2024-06-30 – 2024-07-01 (×2): 3 mL via INTRAVENOUS

## 2024-06-30 MED ORDER — CLOPIDOGREL BISULFATE 300 MG PO TABS
ORAL_TABLET | ORAL | Status: DC | PRN
  Administered 2024-06-30: 300 mg via ORAL

## 2024-06-30 NOTE — Progress Notes (Signed)
 Notified Arida, MD of pt BP=178/117. Norvasc  given, Follow up BP 153/77.

## 2024-06-30 NOTE — Interval H&P Note (Signed)
 History and Physical Interval Note:  06/30/2024 10:48 AM  Sarah Payne  has presented today for surgery, with the diagnosis of CAD.  The various methods of treatment have been discussed with the patient and family. After consideration of risks, benefits and other options for treatment, the patient has consented to  Procedures: CORONARY STENT INTERVENTION (N/A) as a surgical intervention.  The patient's history has been reviewed, patient examined, no change in status, stable for surgery.  I have reviewed the patient's chart and labs.  Questions were answered to the patient's satisfaction.     Coralee Edberg

## 2024-06-30 NOTE — Plan of Care (Signed)
    Problem: Education: Goal: Understanding of CV disease, CV risk reduction, and recovery process will improve Outcome: Progressing Goal: Individualized Educational Video(s) Outcome: Progressing   Problem: Activity: Goal: Ability to return to baseline activity level will improve Outcome: Progressing   Problem: Cardiovascular: Goal: Ability to achieve and maintain adequate cardiovascular perfusion will improve Outcome: Progressing Goal: Vascular access site(s) Level 0-1 will be maintained Outcome: Progressing   Problem: Health Behavior/Discharge Planning: Goal: Ability to safely manage health-related needs after discharge will improve Outcome: Progressing   Problem: Education: Goal: Knowledge of General Education information will improve Description: Including pain rating scale, medication(s)/side effects and non-pharmacologic comfort measures Outcome: Progressing   Problem: Health Behavior/Discharge Planning: Goal: Ability to manage health-related needs will improve Outcome: Progressing   Problem: Clinical Measurements: Goal: Ability to maintain clinical measurements within normal limits will improve Outcome: Progressing Goal: Will remain free from infection Outcome: Progressing Goal: Diagnostic test results will improve Outcome: Progressing Goal: Respiratory complications will improve Outcome: Progressing Goal: Cardiovascular complication will be avoided Outcome: Progressing   Problem: Activity: Goal: Risk for activity intolerance will decrease Outcome: Progressing   Problem: Nutrition: Goal: Adequate nutrition will be maintained Outcome: Progressing   Problem: Coping: Goal: Level of anxiety will decrease Outcome: Progressing   Problem: Elimination: Goal: Will not experience complications related to bowel motility Outcome: Progressing Goal: Will not experience complications related to urinary retention Outcome: Progressing   Problem: Pain  Managment: Goal: General experience of comfort will improve and/or be controlled Outcome: Progressing   Problem: Safety: Goal: Ability to remain free from injury will improve Outcome: Progressing   Problem: Skin Integrity: Goal: Risk for impaired skin integrity will decrease Outcome: Progressing

## 2024-07-01 ENCOUNTER — Other Ambulatory Visit (HOSPITAL_COMMUNITY): Payer: Self-pay

## 2024-07-01 ENCOUNTER — Encounter (HOSPITAL_COMMUNITY): Payer: Self-pay | Admitting: Cardiovascular Disease

## 2024-07-01 LAB — BASIC METABOLIC PANEL WITH GFR
Anion gap: 8 (ref 5–15)
BUN: 13 mg/dL (ref 8–23)
CO2: 24 mmol/L (ref 22–32)
Calcium: 8.4 mg/dL — ABNORMAL LOW (ref 8.9–10.3)
Chloride: 105 mmol/L (ref 98–111)
Creatinine, Ser: 1.3 mg/dL — ABNORMAL HIGH (ref 0.44–1.00)
GFR, Estimated: 43 mL/min — ABNORMAL LOW (ref >60)
Glucose, Bld: 130 mg/dL — ABNORMAL HIGH (ref 70–99)
Potassium: 3.4 mmol/L — ABNORMAL LOW (ref 3.5–5.1)
Sodium: 137 mmol/L (ref 135–145)

## 2024-07-01 LAB — CBC
HCT: 36.6 % (ref 36.0–46.0)
Hemoglobin: 12.3 g/dL (ref 12.0–15.0)
MCH: 30.2 pg (ref 26.0–34.0)
MCHC: 33.6 g/dL (ref 30.0–36.0)
MCV: 89.9 fL (ref 80.0–100.0)
Platelets: 256 K/uL (ref 150–400)
RBC: 4.07 MIL/uL (ref 3.87–5.11)
RDW: 13.8 % (ref 11.5–15.5)
WBC: 8.8 K/uL (ref 4.0–10.5)
nRBC: 0 % (ref 0.0–0.2)

## 2024-07-01 LAB — GLUCOSE, CAPILLARY
Glucose-Capillary: 129 mg/dL — ABNORMAL HIGH (ref 70–99)
Glucose-Capillary: 152 mg/dL — ABNORMAL HIGH (ref 70–99)
Glucose-Capillary: 167 mg/dL — ABNORMAL HIGH (ref 70–99)

## 2024-07-01 MED ORDER — METOPROLOL SUCCINATE ER 50 MG PO TB24
50.0000 mg | ORAL_TABLET | Freq: Every day | ORAL | Status: DC
  Administered 2024-07-01: 50 mg via ORAL
  Filled 2024-07-01: qty 1

## 2024-07-01 MED ORDER — ENSURE PLUS HIGH PROTEIN PO LIQD: 237.0000 mL | Freq: Two times a day (BID) | ORAL | Status: DC

## 2024-07-01 MED ORDER — INSULIN ASPART 100 UNIT/ML IJ SOLN
0.0000 [IU] | Freq: Three times a day (TID) | INTRAMUSCULAR | Status: DC
  Administered 2024-07-01: 3 [IU] via SUBCUTANEOUS
  Filled 2024-07-01: qty 2

## 2024-07-01 MED ORDER — METOPROLOL SUCCINATE ER 50 MG PO TB24
50.0000 mg | ORAL_TABLET | Freq: Every day | ORAL | 3 refills | Status: DC
  Filled 2024-07-01: qty 30, 30d supply, fill #0

## 2024-07-01 NOTE — Progress Notes (Signed)
 DISCHARGE NOTE HOME Sarah Payne to be discharged Home per MD order. Discussed prescriptions and follow up appointments with the patient. Prescriptions given to patient; medication list explained in detail. Patient verbalized understanding.  Skin clean, dry and intact without evidence of skin break down, no evidence of skin tears noted. IV catheter discontinued intact. Site without signs and symptoms of complications. Dressing and pressure applied. Pt denies pain at the site currently. No complaints noted.  Patient free of lines, drains, and wounds.   An After Visit Summary (AVS) was printed and given to the patient. Patient escorted via wheelchair, and discharged home via private auto.  Laurena Valko K Erionna Strum, RN

## 2024-07-01 NOTE — Plan of Care (Signed)
  Problem: Education: Goal: Understanding of CV disease, CV risk reduction, and recovery process will improve Outcome: Progressing   Problem: Activity: Goal: Ability to return to baseline activity level will improve Outcome: Progressing   

## 2024-07-01 NOTE — Plan of Care (Signed)
  Problem: Cardiovascular: Goal: Ability to achieve and maintain adequate cardiovascular perfusion will improve Outcome: Progressing Goal: Vascular access site(s) Level 0-1 will be maintained Outcome: Completed/Met   Problem: Clinical Measurements: Goal: Ability to maintain clinical measurements within normal limits will improve Outcome: Progressing Goal: Will remain free from infection Outcome: Progressing Goal: Diagnostic test results will improve Outcome: Progressing Goal: Respiratory complications will improve Outcome: Progressing Goal: Cardiovascular complication will be avoided Outcome: Progressing

## 2024-07-01 NOTE — Discharge Summary (Signed)
 Discharge Summary   Patient ID: Sarah Payne MRN: [id number]; DOB: Oct 11, 1945  Admit date: 06/30/2024 Discharge date: 07/01/2024  PCP:  Gretta Comer MARLA, NP   [facility] Providers Cardiologist:  Evalene Lunger, MD     Discharge Diagnoses  Principal Problem:   Stable angina Active Problems:   Frequent PVCs   Diagnostic Studies/Procedures   Cath: 06/30/2024    Mid LAD to Dist LAD lesion is 40% stenosed.   Prox Cx to Dist Cx lesion is 30% stenosed.   Dist Cx lesion is 40% stenosed.   RPAV lesion is 80% stenosed.   1st Mrg lesion is 70% stenosed.   1st Diag lesion is 55% stenosed.   Prox LAD to Mid LAD lesion is 90% stenosed.   RPDA-1 lesion is 90% stenosed.   RPDA-2 lesion is 50% stenosed.   Dist RCA lesion is 90% stenosed.   Prox RCA to Mid RCA lesion is 40% stenosed.   Mid RCA to Dist RCA lesion is 30% stenosed.   A drug-eluting stent was successfully placed using a STENT SYNERGY XD 3.50X20.   A drug-eluting stent was successfully placed using a STENT ONYX FRONTIER 2.5X34.   A drug-eluting stent was successfully placed using a STENT ONYX FRONTIER [id number].   Post intervention, there is a 0% residual stenosis.   Post intervention, there is a 0% residual stenosis.   Post intervention, there is a 0% residual stenosis.   In the absence of any other complications or medical issues, we expect the patient to be ready for discharge from an interventional cardiology perspective on 07/01/2024.   Recommend dual antiplatelet therapy with Aspirin  81mg  daily and Clopidogrel  75mg  daily.   1.  Successful OCT guided PCI with intravascular lithotripsy and drug-eluting stent placement to the proximal LAD. 2.  Successful PCI and 2 overlapped drug-eluting stents to the right AV groove and distal right coronary artery.  Extremely difficult and challenging procedure due to difficulty crossing with a wire and balloons.   Recommendations: Continue lifelong dual antiplatelet  therapy as tolerated. This was a complex procedure and required 180 mL of contrast.  Will hydrate overnight and check renal function in the morning.  Diagnostic Dominance: Right  Intervention   _____________   History of Present Illness   Sarah Payne is a 78 y.o. female with past medical history of CAD status post multiple stents, chronic systolic heart failure, tobacco use, poorly controlled diabetes, hypertension, hyperlipidemia and peripheral vascular disease.  She is followed by Dr. Darron and Dr. Gollan as an outpatient.  She was seen in 2022 by Dr. Darron for severe left calf pain and underwent ABIs which were abnormal.  Follow-up peripheral angiography 08/2020 showed no significant aortoiliac disease. On the left side, there was severe calcified disease in the distal SFA with two-vessel runoff below the knee with occluded anterior tibial artery. Posterior tibial artery had moderate diffuse disease in the mid to distal segment.  Underwent successful orbital atherectomy and drug-coated balloon angioplasty of the left SFA.  Seen 04/2024 with chest pain and EKG changes and underwent cardiac catheterization with Dr. Darron significant diffuse three-vessel CAD.  Right heart cath with normal right and left-sided filling pressures and normal cardiac output.  She was referred to cardiothoracic surgery for evaluation for possible CABG but felt to not be a good candidate given her comorbidities.  She was referred back to consider multivessel PCI.   Hospital Course    Underwent cardiac catheterization 12/11 with successful OTC guided PCI  with intravascular lithotripsy and DES x 1 to proximal LAD as well as PCI/DES x 2 with overlapping stents to the right AV groove and distal right coronary artery.  Recommendations for long-term DAPT with aspirin /Plavix .  Admitted overnight for IV hydration.  Renal function stable the following morning. Ambulated with no issues. Telemetry with notable PVCs overnight.  Added Toprol 50mg  daily at discharge.   General: Well developed, well nourished, female appearing in no acute distress. Head: Normocephalic, atraumatic.  Neck: Supple without bruits, JVD. Lungs:  Resp regular and unlabored, CTA. Heart: RRR, S1, S2, no S3, S4, or murmur; no rub. Abdomen: Soft, non-tender, non-distended with normoactive bowel sounds.  Extremities: No clubbing, cyanosis, edema. Distal pedal pulses are 2+ bilaterally. Right femoral cath site stable without bruising or hematoma Neuro: Alert and oriented X 3. Moves all extremities spontaneously. Psych: Normal affect.  Patient seen by myself and Dr. Elmira and deemed stable for discharge.  Follow-up arranged in the office.  Did the patient have an acute coronary syndrome (MI, NSTEMI, STEMI, etc) this admission?:  No                               Did the patient have a percutaneous coronary intervention (stent / angioplasty)?:  No.    _____________  Discharge Vitals Blood pressure (!) 149/72, pulse 61, temperature 98.1 F (36.7 C), temperature source Oral, resp. rate 15, height 5' 6 (1.676 m), weight 58.1 kg, SpO2 94%.  Filed Weights   06/30/24 0558  Weight: 58.1 kg    Labs & Radiologic Studies  CBC Recent Labs    07/01/24 0321  WBC 8.8  HGB 12.3  HCT 36.6  MCV 89.9  PLT 256   Basic Metabolic Panel Recent Labs    87/87/74 0321  NA 137  K 3.4*  CL 105  CO2 24  GLUCOSE 130*  BUN 13  CREATININE 1.30*  CALCIUM  8.4*   Liver Function Tests No results for input(s): AST, ALT, ALKPHOS, BILITOT, PROT, ALBUMIN in the last 72 hours. No results for input(s): LIPASE, AMYLASE in the last 72 hours. High Sensitivity Troponin:   No results for input(s): TROPONINIHS in the last 720 hours.  No results for input(s): TRNPT in the last 720 hours.  BNP Invalid input(s): POCBNP No results for input(s): PROBNP in the last 72 hours.  No results for input(s): BNP in the last 72 hours.   D-Dimer No results for input(s): DDIMER in the last 72 hours. Hemoglobin A1C No results for input(s): HGBA1C in the last 72 hours. Fasting Lipid Panel No results for input(s): CHOL, HDL, LDLCALC, TRIG, CHOLHDL, LDLDIRECT in the last 72 hours. No results found for: LIPOA  Thyroid  Function Tests No results for input(s): TSH, T4TOTAL, T3FREE, THYROIDAB in the last 72 hours.  Invalid input(s): FREET3 _____________  CARDIAC CATHETERIZATION Result Date: 07/01/2024   Mid LAD to Dist LAD lesion is 40% stenosed.   Prox Cx to Dist Cx lesion is 30% stenosed.   Dist Cx lesion is 40% stenosed.   RPAV lesion is 80% stenosed.   1st Mrg lesion is 70% stenosed.   1st Diag lesion is 55% stenosed.   Prox LAD to Mid LAD lesion is 90% stenosed.   RPDA-1 lesion is 90% stenosed.   RPDA-2 lesion is 50% stenosed.   Dist RCA lesion is 90% stenosed.   Prox RCA to Mid RCA lesion is 40% stenosed.   Mid RCA to [facility]  RCA lesion is 30% stenosed.   A drug-eluting stent was successfully placed using a STENT SYNERGY XD 3.50X20.   A drug-eluting stent was successfully placed using a STENT ONYX FRONTIER 2.5X34.   A drug-eluting stent was successfully placed using a STENT ONYX FRONTIER [id number].   Post intervention, there is a 0% residual stenosis.   Post intervention, there is a 0% residual stenosis.   Post intervention, there is a 0% residual stenosis.   In the absence of any other complications or medical issues, we expect the patient to be ready for discharge from an interventional cardiology perspective on 07/01/2024.   Recommend dual antiplatelet therapy with Aspirin  81mg  daily and Clopidogrel  75mg  daily. 1.  Successful OCT guided PCI with intravascular lithotripsy and drug-eluting stent placement to the proximal LAD. 2.  Successful PCI and 2 overlapped drug-eluting stents to the right AV groove and distal right coronary artery.  Extremely difficult and challenging procedure due to difficulty crossing with  a wire and balloons. Recommendations: Continue lifelong dual antiplatelet therapy as tolerated. This was a complex procedure and required 180 mL of contrast.  Will hydrate overnight and check renal function in the morning.    Disposition Pt is being discharged home today in good condition.  Follow-up Plans & Appointments  Discharge Instructions     AMB Referral to Cardiac Rehabilitation - Phase II   Complete by: As directed    Diagnosis: Coronary Stents   After initial evaluation and assessments completed: Virtual Based Care may be provided alone or in conjunction with Phase 2 Cardiac Rehab based on patient barriers.: Yes   Intensive Cardiac Rehabilitation (ICR) [facility] location only OR Traditional Cardiac Rehabilitation (TCR) *If criteria for ICR are not met will enroll in TCR [facility] only): Yes   Call MD for:  difficulty breathing, headache or visual disturbances   Complete by: As directed    Call MD for:  persistant dizziness or light-headedness   Complete by: As directed    Call MD for:  redness, tenderness, or signs of infection (pain, swelling, redness, odor or green/yellow discharge around incision site)   Complete by: As directed    Discharge instructions   Complete by: As directed    Groin Site Care Refer to this sheet in the next few weeks. These instructions provide you with information on caring for yourself after your procedure. Your caregiver may also give you more specific instructions. Your treatment has been planned according to current medical practices, but problems sometimes occur. Call your caregiver if you have any problems or questions after your procedure. HOME CARE INSTRUCTIONS You may shower 24 hours after the procedure. Remove the bandage (dressing) and gently wash the site with plain soap and water . Gently pat the site dry.  Do not apply powder or lotion to the site.  Do not sit in a bathtub, swimming pool, or whirlpool for 5 to 7 days.  No bending, squatting, or  lifting anything over 10 pounds (4.5 kg) as directed by your caregiver.  Inspect the site at least twice daily.  Do not drive home if you are discharged the same day of the procedure. Have someone else drive you.  You may drive 24 hours after the procedure unless otherwise instructed by your caregiver.  What to expect: Any bruising will usually fade within 1 to 2 weeks.  Blood that collects in the tissue (hematoma) may be painful to the touch. It should usually decrease in size and tenderness within 1 to 2 weeks.  SEEK IMMEDIATE MEDICAL CARE IF: You have unusual pain at the groin site or down the affected leg.  You have redness, warmth, swelling, or pain at the groin site.  You have drainage (other than a small amount of blood on the dressing).  You have chills.  You have a fever or persistent symptoms for more than 72 hours.  You have a fever and your symptoms suddenly get worse.  Your leg becomes pale, cool, tingly, or numb.  You have heavy bleeding from the site. Hold pressure on the site. SABRA  PLEASE DO NOT MISS ANY DOSES OF YOUR PLAVIX !!!!! Also keep a log of you blood pressures and bring back to your follow up appt. Please call the office with any questions.   Patients taking blood thinners should generally stay away from medicines like ibuprofen, Advil, Motrin, naproxen, and Aleve due to risk of stomach bleeding. You may take Tylenol  as directed or talk to your primary doctor about alternatives.   PLEASE ENSURE THAT YOU DO NOT RUN OUT OF YOUR PLAVIX . IF you have issues obtaining this medication due to cost please CALL the office 3-5 business days prior to running out in order to prevent missing doses of this medication.   Increase activity slowly   Complete by: As directed        Discharge Medications Allergies as of 07/01/2024   No Known Allergies      Medication List     TAKE these medications    acetaminophen  650 MG CR tablet Commonly known as: TYLENOL  Take 1,300 mg  by mouth every 8 (eight) hours as needed for pain.   albuterol  108 (90 Base) MCG/ACT inhaler Commonly known as: VENTOLIN  HFA Inhale 1-2 puffs into the lungs every 6 (six) hours as needed for wheezing or shortness of breath.   amLODipine  10 MG tablet Commonly known as: NORVASC  Take 1 tablet (10 mg total) by mouth daily.   aspirin  EC 81 MG tablet Take 1 tablet (81 mg total) by mouth daily. Swallow whole.   atorvastatin  80 MG tablet Commonly known as: LIPITOR  Take 1 tablet (80 mg total) by mouth daily. for cholesterol.   clobetasol  cream 0.05 % Commonly known as: TEMOVATE  Apply 1 Application topically 2 (two) times daily as needed. For vaginal itching.   clopidogrel  75 MG tablet Commonly known as: PLAVIX  Take 1 tablet (75 mg total) by mouth daily.   ezetimibe  10 MG tablet Commonly known as: ZETIA  Take 1 tablet (10 mg total) by mouth daily. For cholesterol.   FreeStyle Libre 2 Reader [employer] Use with sensor to check blood sugars 6 times daily.   FreeStyle Libre 2 Sensor Misc USE TO CHECK BLOOD SUGAR CHANGE EVERY 14 DAYS   furosemide  20 MG tablet Commonly known as: LASIX  Take 1 tablet (20 mg total) by mouth daily as needed (As needed for weight gain greater than 3 pounds overnight or increased shortness of breath.). Take one tab only on Monday, Wednesday, & Fridays What changed:  when to take this additional instructions   gabapentin  300 MG capsule Commonly known as: NEURONTIN  Take 1 capsule (300 mg total) by mouth daily. May take an additional dose throughout the day if needed for pain What changed:  when to take this reasons to take this additional instructions   Gvoke HypoPen  2-Pack 1 MG/0.2ML Soaj Generic drug: Glucagon  Inject 1 mg (1 pen) as needed for severe low blood sugar (sustained glucose less than 55 despite oral glucose treatments). May repeat in 15 minutes as  needed.   Insupen Pen Needles 32G X 4 MM Misc Generic drug: Insulin  Pen Needle Use to inject  insulin  4 times daily.   isosorbide  mononitrate 30 MG 24 hr tablet Commonly known as: IMDUR  Take 1 tablet (30 mg total) by mouth daily.   levothyroxine  100 MCG tablet Commonly known as: SYNTHROID  TAKE 1 TABLET BY MOUTH IN THE MORNING ON  AN  EMPTY  STOMACH  WITH  WATER   ONLY.  NO  FOOD  OR  OTHER  MEDICATIONS  FOR  30  MINUTES   metoprolol succinate 50 MG 24 hr tablet Commonly known as: TOPROL-XL Take 1 tablet (50 mg total) by mouth daily. Take with or immediately following a meal.   mirtazapine  15 MG tablet Commonly known as: REMERON  Take 1 tablet (15 mg total) by mouth at bedtime. For sleep What changed:  when to take this reasons to take this additional instructions   nicotine  21 mg/24hr patch Commonly known as: NICODERM CQ  - dosed in mg/24 hours Place 1 patch (21 mg total) onto the skin daily.   nicotine  polacrilex 2 MG lozenge Commonly known as: Nicotine  Mini Take 1 lozenge (2 mg total) by mouth every 2 (two) hours as needed for smoking cessation.   nitroGLYCERIN  0.4 MG SL tablet Commonly known as: Nitrostat  Place 1 tablet (0.4 mg total) under the tongue every 5 (five) minutes as needed for chest pain.   NovoLOG  FlexPen 100 UNIT/ML FlexPen Generic drug: insulin  aspart INJECT 5 UNITS SUBCUTANEOUSLY THREE TIMES DAILY WITH MEALS PER SLIDING SCALE (FOR DIABETES)   ondansetron  4 MG tablet Commonly known as: ZOFRAN  Take 1 tablet (4 mg total) by mouth every 6 (six) hours as needed for nausea.   potassium chloride  10 MEQ tablet Commonly known as: KLOR-CON  M Take 10 mEq by mouth every Monday, Wednesday, and Friday.   sitaGLIPtin  100 MG tablet Commonly known as: Januvia  Take 1 tablet (100 mg total) by mouth daily. for diabetes.   umeclidinium-vilanterol 62.5-25 MCG/ACT Aepb Commonly known as: Anoro Ellipta  Inhale 1 puff into the lungs daily.         Outstanding Labs/Studies  N/a   Duration of Discharge Encounter: APP Time: 18 minutes   Signed, Manuelita Rummer, NP 07/01/2024, 10:35 AM

## 2024-07-01 NOTE — Progress Notes (Signed)
 Pt ambulated hall with daughter earlier. Feels well. Discussed with pt and daughter stents, restrictions, Plavix  importance, smoking cessation (congratulated and encouraged her as she quit smoking 1 month ago), diet, exercise, NTG, and CRPII. Pt receptive. Will refer to [facility] CRPII.  [id number] Aliene Aris BS, ACSM-CEP 07/01/2024 12:05 PM

## 2024-07-03 LAB — LIPOPROTEIN A (LPA): Lipoprotein (a): 214.2 nmol/L — ABNORMAL HIGH (ref <75.0)

## 2024-07-21 ENCOUNTER — Other Ambulatory Visit: Payer: Self-pay

## 2024-07-21 ENCOUNTER — Inpatient Hospital Stay
Admit: 2024-07-21 | Discharge: 2024-07-21 | Disposition: A | Discharge status: Home / Self Care | Attending: Cardiovascular Disease | Admitting: Cardiovascular Disease

## 2024-07-21 ENCOUNTER — Emergency Department

## 2024-07-21 ENCOUNTER — Inpatient Hospital Stay
Admission: EM | Admit: 2024-07-21 | Discharge: 2024-07-26 | DRG: 286 | Disposition: A | Discharge status: Home / Self Care | Attending: Internal Medicine | Admitting: Internal Medicine

## 2024-07-21 DIAGNOSIS — R42 Dizziness and giddiness: Secondary | ICD-10-CM | POA: Diagnosis not present

## 2024-07-21 DIAGNOSIS — J441 Chronic obstructive pulmonary disease with (acute) exacerbation: Secondary | ICD-10-CM | POA: Diagnosis present

## 2024-07-21 DIAGNOSIS — F17210 Nicotine dependence, cigarettes, uncomplicated: Secondary | ICD-10-CM | POA: Diagnosis present

## 2024-07-21 DIAGNOSIS — Z7982 Long term (current) use of aspirin: Secondary | ICD-10-CM

## 2024-07-21 DIAGNOSIS — R7989 Other specified abnormal findings of blood chemistry: Secondary | ICD-10-CM | POA: Diagnosis present

## 2024-07-21 DIAGNOSIS — J189 Pneumonia, unspecified organism: Secondary | ICD-10-CM | POA: Diagnosis present

## 2024-07-21 DIAGNOSIS — I2510 Atherosclerotic heart disease of native coronary artery without angina pectoris: Secondary | ICD-10-CM | POA: Diagnosis present

## 2024-07-21 DIAGNOSIS — I2489 Other forms of acute ischemic heart disease: Secondary | ICD-10-CM | POA: Diagnosis present

## 2024-07-21 DIAGNOSIS — I5023 Acute on chronic systolic (congestive) heart failure: Secondary | ICD-10-CM | POA: Diagnosis present

## 2024-07-21 DIAGNOSIS — J440 Chronic obstructive pulmonary disease with acute lower respiratory infection: Secondary | ICD-10-CM | POA: Diagnosis present

## 2024-07-21 DIAGNOSIS — E1151 Type 2 diabetes mellitus with diabetic peripheral angiopathy without gangrene: Secondary | ICD-10-CM | POA: Diagnosis present

## 2024-07-21 DIAGNOSIS — I252 Old myocardial infarction: Secondary | ICD-10-CM

## 2024-07-21 DIAGNOSIS — I700 Atherosclerosis of aorta: Secondary | ICD-10-CM | POA: Diagnosis present

## 2024-07-21 DIAGNOSIS — I4892 Unspecified atrial flutter: Secondary | ICD-10-CM | POA: Diagnosis present

## 2024-07-21 DIAGNOSIS — Z8679 Personal history of other diseases of the circulatory system: Secondary | ICD-10-CM | POA: Diagnosis not present

## 2024-07-21 DIAGNOSIS — I160 Hypertensive urgency: Secondary | ICD-10-CM | POA: Diagnosis present

## 2024-07-21 DIAGNOSIS — Z7902 Long term (current) use of antithrombotics/antiplatelets: Secondary | ICD-10-CM

## 2024-07-21 DIAGNOSIS — Z1152 Encounter for screening for COVID-19: Secondary | ICD-10-CM

## 2024-07-21 DIAGNOSIS — G4733 Obstructive sleep apnea (adult) (pediatric): Secondary | ICD-10-CM | POA: Diagnosis present

## 2024-07-21 DIAGNOSIS — I25118 Atherosclerotic heart disease of native coronary artery with other forms of angina pectoris: Secondary | ICD-10-CM

## 2024-07-21 DIAGNOSIS — G894 Chronic pain syndrome: Secondary | ICD-10-CM | POA: Diagnosis present

## 2024-07-21 DIAGNOSIS — Z7984 Long term (current) use of oral hypoglycemic drugs: Secondary | ICD-10-CM

## 2024-07-21 DIAGNOSIS — Z79899 Other long term (current) drug therapy: Secondary | ICD-10-CM

## 2024-07-21 DIAGNOSIS — E1140 Type 2 diabetes mellitus with diabetic neuropathy, unspecified: Secondary | ICD-10-CM | POA: Diagnosis present

## 2024-07-21 DIAGNOSIS — I5031 Acute diastolic (congestive) heart failure: Secondary | ICD-10-CM

## 2024-07-21 DIAGNOSIS — E1122 Type 2 diabetes mellitus with diabetic chronic kidney disease: Secondary | ICD-10-CM | POA: Diagnosis present

## 2024-07-21 DIAGNOSIS — N1832 Chronic kidney disease, stage 3b: Secondary | ICD-10-CM | POA: Diagnosis present

## 2024-07-21 DIAGNOSIS — J432 Centrilobular emphysema: Secondary | ICD-10-CM | POA: Diagnosis not present

## 2024-07-21 DIAGNOSIS — I5021 Acute systolic (congestive) heart failure: Secondary | ICD-10-CM | POA: Diagnosis not present

## 2024-07-21 DIAGNOSIS — Z9981 Dependence on supplemental oxygen: Secondary | ICD-10-CM

## 2024-07-21 DIAGNOSIS — I214 Non-ST elevation (NSTEMI) myocardial infarction: Secondary | ICD-10-CM | POA: Diagnosis not present

## 2024-07-21 DIAGNOSIS — E039 Hypothyroidism, unspecified: Secondary | ICD-10-CM | POA: Diagnosis present

## 2024-07-21 DIAGNOSIS — E11649 Type 2 diabetes mellitus with hypoglycemia without coma: Secondary | ICD-10-CM | POA: Diagnosis not present

## 2024-07-21 DIAGNOSIS — E876 Hypokalemia: Secondary | ICD-10-CM | POA: Diagnosis present

## 2024-07-21 DIAGNOSIS — I130 Hypertensive heart and chronic kidney disease with heart failure and stage 1 through stage 4 chronic kidney disease, or unspecified chronic kidney disease: Principal | ICD-10-CM | POA: Diagnosis present

## 2024-07-21 DIAGNOSIS — I10 Essential (primary) hypertension: Secondary | ICD-10-CM | POA: Diagnosis present

## 2024-07-21 DIAGNOSIS — Z794 Long term (current) use of insulin: Secondary | ICD-10-CM

## 2024-07-21 DIAGNOSIS — I509 Heart failure, unspecified: Secondary | ICD-10-CM | POA: Insufficient documentation

## 2024-07-21 DIAGNOSIS — E785 Hyperlipidemia, unspecified: Secondary | ICD-10-CM | POA: Diagnosis present

## 2024-07-21 DIAGNOSIS — E782 Mixed hyperlipidemia: Secondary | ICD-10-CM | POA: Diagnosis present

## 2024-07-21 DIAGNOSIS — G4700 Insomnia, unspecified: Secondary | ICD-10-CM

## 2024-07-21 DIAGNOSIS — Z90710 Acquired absence of both cervix and uterus: Secondary | ICD-10-CM

## 2024-07-21 DIAGNOSIS — J9601 Acute respiratory failure with hypoxia: Secondary | ICD-10-CM | POA: Diagnosis not present

## 2024-07-21 DIAGNOSIS — Z961 Presence of intraocular lens: Secondary | ICD-10-CM | POA: Diagnosis present

## 2024-07-21 DIAGNOSIS — I272 Pulmonary hypertension, unspecified: Secondary | ICD-10-CM | POA: Diagnosis present

## 2024-07-21 DIAGNOSIS — N189 Chronic kidney disease, unspecified: Secondary | ICD-10-CM | POA: Diagnosis not present

## 2024-07-21 DIAGNOSIS — I5041 Acute combined systolic (congestive) and diastolic (congestive) heart failure: Secondary | ICD-10-CM | POA: Diagnosis not present

## 2024-07-21 DIAGNOSIS — Z96612 Presence of left artificial shoulder joint: Secondary | ICD-10-CM | POA: Diagnosis present

## 2024-07-21 DIAGNOSIS — Z8249 Family history of ischemic heart disease and other diseases of the circulatory system: Secondary | ICD-10-CM

## 2024-07-21 DIAGNOSIS — I5022 Chronic systolic (congestive) heart failure: Secondary | ICD-10-CM | POA: Diagnosis present

## 2024-07-21 DIAGNOSIS — N183 Chronic kidney disease, stage 3 unspecified: Secondary | ICD-10-CM | POA: Diagnosis present

## 2024-07-21 DIAGNOSIS — I447 Left bundle-branch block, unspecified: Secondary | ICD-10-CM | POA: Diagnosis present

## 2024-07-21 DIAGNOSIS — J449 Chronic obstructive pulmonary disease, unspecified: Secondary | ICD-10-CM | POA: Diagnosis not present

## 2024-07-21 DIAGNOSIS — Z955 Presence of coronary angioplasty implant and graft: Secondary | ICD-10-CM

## 2024-07-21 DIAGNOSIS — F17290 Nicotine dependence, other tobacco product, uncomplicated: Secondary | ICD-10-CM | POA: Diagnosis present

## 2024-07-21 DIAGNOSIS — J9621 Acute and chronic respiratory failure with hypoxia: Secondary | ICD-10-CM | POA: Diagnosis present

## 2024-07-21 DIAGNOSIS — Z79890 Hormone replacement therapy (postmenopausal): Secondary | ICD-10-CM

## 2024-07-21 LAB — COMPREHENSIVE METABOLIC PANEL WITH GFR
ALT: 14 U/L (ref 0–44)
AST: 24 U/L (ref 15–41)
Albumin: 3.9 g/dL (ref 3.5–5.0)
Alkaline Phosphatase: 128 U/L — ABNORMAL HIGH (ref 38–126)
Anion gap: 16 — ABNORMAL HIGH (ref 5–15)
BUN: 19 mg/dL (ref 8–23)
CO2: 20 mmol/L — ABNORMAL LOW (ref 22–32)
Calcium: 9.7 mg/dL (ref 8.9–10.3)
Chloride: 102 mmol/L (ref 98–111)
Creatinine, Ser: 1.67 mg/dL — ABNORMAL HIGH (ref 0.44–1.00)
GFR, Estimated: 31 mL/min — ABNORMAL LOW
Glucose, Bld: 296 mg/dL — ABNORMAL HIGH (ref 70–99)
Potassium: 4.3 mmol/L (ref 3.5–5.1)
Sodium: 138 mmol/L (ref 135–145)
Total Bilirubin: 0.3 mg/dL (ref 0.0–1.2)
Total Protein: 7.6 g/dL (ref 6.5–8.1)

## 2024-07-21 LAB — CBC WITH DIFFERENTIAL/PLATELET
Abs Immature Granulocytes: 0.1 K/uL — ABNORMAL HIGH (ref 0.00–0.07)
Basophils Absolute: 0.1 K/uL (ref 0.0–0.1)
Basophils Relative: 0 %
Eosinophils Absolute: 0.2 K/uL (ref 0.0–0.5)
Eosinophils Relative: 1 %
HCT: 43.2 % (ref 36.0–46.0)
Hemoglobin: 13.8 g/dL (ref 12.0–15.0)
Immature Granulocytes: 1 %
Lymphocytes Relative: 42 %
Lymphs Abs: 6.7 K/uL — ABNORMAL HIGH (ref 0.7–4.0)
MCH: 29.4 pg (ref 26.0–34.0)
MCHC: 31.9 g/dL (ref 30.0–36.0)
MCV: 92.1 fL (ref 80.0–100.0)
Monocytes Absolute: 0.6 K/uL (ref 0.1–1.0)
Monocytes Relative: 4 %
Neutro Abs: 8.2 K/uL — ABNORMAL HIGH (ref 1.7–7.7)
Neutrophils Relative %: 52 %
Platelets: 404 K/uL — ABNORMAL HIGH (ref 150–400)
RBC: 4.69 MIL/uL (ref 3.87–5.11)
RDW: 14.3 % (ref 11.5–15.5)
WBC: 15.9 K/uL — ABNORMAL HIGH (ref 4.0–10.5)
nRBC: 0 % (ref 0.0–0.2)

## 2024-07-21 LAB — PRO BRAIN NATRIURETIC PEPTIDE: Pro Brain Natriuretic Peptide: 8644 pg/mL — ABNORMAL HIGH

## 2024-07-21 LAB — ECHOCARDIOGRAM COMPLETE
AR max vel: 2.34 cm2
AV Area VTI: 2.58 cm2
AV Area mean vel: 2.23 cm2
AV Mean grad: 2 mmHg
AV Peak grad: 3.5 mmHg
Ao pk vel: 0.93 m/s
Area-P 1/2: 3.54 cm2
Calc EF: 17.9 %
Height: 66 in
MV VTI: 1.71 cm2
S' Lateral: 5 cm
Single Plane A2C EF: 4.9 %
Single Plane A4C EF: 25.8 %
Weight: 1915.36 [oz_av]

## 2024-07-21 LAB — RESP PANEL BY RT-PCR (RSV, FLU A&B, COVID)  RVPGX2
Influenza A by PCR: NEGATIVE
Influenza B by PCR: NEGATIVE
Resp Syncytial Virus by PCR: NEGATIVE
SARS Coronavirus 2 by RT PCR: NEGATIVE

## 2024-07-21 LAB — BLOOD GAS, VENOUS
Acid-base deficit: 4.3 mmol/L — ABNORMAL HIGH (ref 0.0–2.0)
Bicarbonate: 23 mmol/L (ref 20.0–28.0)
O2 Saturation: 79 %
Patient temperature: 37
pCO2, Ven: 50 mmHg (ref 44–60)
pH, Ven: 7.27 (ref 7.25–7.43)
pO2, Ven: 53 mmHg — ABNORMAL HIGH (ref 32–45)

## 2024-07-21 LAB — TROPONIN T, HIGH SENSITIVITY
Troponin T High Sensitivity: 162 ng/L (ref 0–19)
Troponin T High Sensitivity: 134 ng/L (ref 0–19)
Troponin T High Sensitivity: 662 ng/L (ref 0–19)
Troponin T High Sensitivity: 795 ng/L (ref 0–19)

## 2024-07-21 LAB — CBG MONITORING, ED
Glucose-Capillary: 421 mg/dL — ABNORMAL HIGH (ref 70–99)
Glucose-Capillary: 336 mg/dL — ABNORMAL HIGH (ref 70–99)
Glucose-Capillary: 317 mg/dL — ABNORMAL HIGH (ref 70–99)
Glucose-Capillary: 268 mg/dL — ABNORMAL HIGH (ref 70–99)

## 2024-07-21 LAB — PROTIME-INR
INR: 1 (ref 0.8–1.2)
Prothrombin Time: 13.5 s (ref 11.4–15.2)

## 2024-07-21 LAB — APTT: aPTT: 36 s (ref 24–36)

## 2024-07-21 LAB — HEPARIN LEVEL (UNFRACTIONATED): Heparin Unfractionated: 0.34 [IU]/mL (ref 0.30–0.70)

## 2024-07-21 MED ORDER — HEPARIN (PORCINE) 25000 UT/250ML-% IV SOLN
1100.0000 [IU]/h | INTRAVENOUS | Status: DC
  Administered 2024-07-21: 650 [IU]/h via INTRAVENOUS
  Administered 2024-07-23: 1000 [IU]/h via INTRAVENOUS
  Administered 2024-07-24: 1100 [IU]/h via INTRAVENOUS
  Filled 2024-07-21 (×4): qty 250

## 2024-07-21 MED ORDER — ONDANSETRON HCL 4 MG PO TABS: 4.0000 mg | ORAL_TABLET | Freq: Four times a day (QID) | ORAL | Status: DC | PRN

## 2024-07-21 MED ORDER — METHYLPREDNISOLONE SODIUM SUCC 125 MG IJ SOLR
125.0000 mg | Freq: Once | INTRAMUSCULAR | Status: AC
  Administered 2024-07-21: 125 mg via INTRAVENOUS
  Filled 2024-07-21: qty 2

## 2024-07-21 MED ORDER — ONDANSETRON HCL 4 MG/2ML IJ SOLN: 4.0000 mg | Freq: Four times a day (QID) | INTRAMUSCULAR | Status: DC | PRN

## 2024-07-21 MED ORDER — ALBUTEROL SULFATE (2.5 MG/3ML) 0.083% IN NEBU: 2.5000 mg | INHALATION_SOLUTION | RESPIRATORY_TRACT | Status: DC | PRN

## 2024-07-21 MED ORDER — EZETIMIBE 10 MG PO TABS
10.0000 mg | ORAL_TABLET | Freq: Every day | ORAL | Status: DC
  Administered 2024-07-21 – 2024-07-26 (×6): 10 mg via ORAL
  Filled 2024-07-21 (×6): qty 1

## 2024-07-21 MED ORDER — FUROSEMIDE 10 MG/ML IJ SOLN
40.0000 mg | Freq: Two times a day (BID) | INTRAMUSCULAR | Status: DC
  Administered 2024-07-21 – 2024-07-22 (×2): 40 mg via INTRAVENOUS
  Filled 2024-07-21 (×2): qty 4

## 2024-07-21 MED ORDER — HYDRALAZINE HCL 20 MG/ML IJ SOLN: 10.0000 mg | Freq: Four times a day (QID) | INTRAMUSCULAR | Status: DC | PRN

## 2024-07-21 MED ORDER — ASPIRIN 81 MG PO TBEC
81.0000 mg | DELAYED_RELEASE_TABLET | Freq: Every day | ORAL | Status: DC
  Administered 2024-07-21 – 2024-07-26 (×6): 81 mg via ORAL
  Filled 2024-07-21 (×6): qty 1

## 2024-07-21 MED ORDER — MORPHINE SULFATE (PF) 2 MG/ML IV SOLN
2.0000 mg | INTRAVENOUS | Status: DC | PRN
  Administered 2024-07-21: 2 mg via INTRAVENOUS
  Filled 2024-07-21 (×3): qty 1

## 2024-07-21 MED ORDER — OXYCODONE HCL 5 MG PO TABS: 5.0000 mg | ORAL_TABLET | Freq: Four times a day (QID) | ORAL | Status: DC | PRN

## 2024-07-21 MED ORDER — ISOSORBIDE MONONITRATE ER 30 MG PO TB24
30.0000 mg | ORAL_TABLET | Freq: Every day | ORAL | Status: DC
  Administered 2024-07-21 – 2024-07-25 (×5): 30 mg via ORAL
  Filled 2024-07-21 (×5): qty 1

## 2024-07-21 MED ORDER — FUROSEMIDE 10 MG/ML IJ SOLN: 40.0000 mg | Freq: Two times a day (BID) | INTRAMUSCULAR | Status: DC

## 2024-07-21 MED ORDER — CLOPIDOGREL BISULFATE 75 MG PO TABS
75.0000 mg | ORAL_TABLET | Freq: Every day | ORAL | Status: DC
  Administered 2024-07-21 – 2024-07-26 (×6): 75 mg via ORAL
  Filled 2024-07-21 (×6): qty 1

## 2024-07-21 MED ORDER — IPRATROPIUM-ALBUTEROL 0.5-2.5 (3) MG/3ML IN SOLN
3.0000 mL | Freq: Once | RESPIRATORY_TRACT | Status: AC
  Administered 2024-07-21: 3 mL via RESPIRATORY_TRACT
  Filled 2024-07-21: qty 3

## 2024-07-21 MED ORDER — METOPROLOL SUCCINATE ER 50 MG PO TB24
50.0000 mg | ORAL_TABLET | Freq: Every day | ORAL | Status: DC
  Administered 2024-07-21 – 2024-07-26 (×6): 50 mg via ORAL
  Filled 2024-07-21 (×6): qty 1

## 2024-07-21 MED ORDER — ATORVASTATIN CALCIUM 80 MG PO TABS
80.0000 mg | ORAL_TABLET | Freq: Every day | ORAL | Status: DC
  Administered 2024-07-21 – 2024-07-26 (×6): 80 mg via ORAL
  Filled 2024-07-21: qty 1
  Filled 2024-07-21: qty 4
  Filled 2024-07-21 (×4): qty 1

## 2024-07-21 MED ORDER — MIRTAZAPINE 15 MG PO TABS
15.0000 mg | ORAL_TABLET | Freq: Every evening | ORAL | Status: DC | PRN
  Administered 2024-07-21 – 2024-07-25 (×3): 15 mg via ORAL
  Filled 2024-07-21 (×3): qty 1

## 2024-07-21 MED ORDER — NITROGLYCERIN 0.4 MG SL SUBL: 0.4000 mg | SUBLINGUAL_TABLET | SUBLINGUAL | Status: DC | PRN

## 2024-07-21 MED ORDER — UMECLIDINIUM-VILANTEROL 62.5-25 MCG/ACT IN AEPB
1.0000 | INHALATION_SPRAY | Freq: Every day | RESPIRATORY_TRACT | Status: DC
  Administered 2024-07-23 – 2024-07-26 (×4): 1 via RESPIRATORY_TRACT
  Filled 2024-07-21 (×3): qty 14

## 2024-07-21 MED ORDER — ACETAMINOPHEN 325 MG PO TABS
650.0000 mg | ORAL_TABLET | Freq: Four times a day (QID) | ORAL | Status: DC | PRN
  Administered 2024-07-24: 650 mg via ORAL
  Filled 2024-07-21: qty 2

## 2024-07-21 MED ORDER — FUROSEMIDE 10 MG/ML IJ SOLN
40.0000 mg | Freq: Once | INTRAMUSCULAR | Status: AC
  Administered 2024-07-21: 40 mg via INTRAVENOUS
  Filled 2024-07-21: qty 4

## 2024-07-21 MED ORDER — ACETAMINOPHEN 650 MG RE SUPP: 650.0000 mg | Freq: Four times a day (QID) | RECTAL | Status: DC | PRN

## 2024-07-21 MED ORDER — INSULIN ASPART 100 UNIT/ML IJ SOLN
0.0000 [IU] | Freq: Every day | INTRAMUSCULAR | Status: DC
  Administered 2024-07-23: 3 [IU] via SUBCUTANEOUS
  Administered 2024-07-24: 4 [IU] via SUBCUTANEOUS
  Administered 2024-07-25: 3 [IU] via SUBCUTANEOUS
  Filled 2024-07-21: qty 2
  Filled 2024-07-21: qty 3
  Filled 2024-07-21: qty 4
  Filled 2024-07-21: qty 3

## 2024-07-21 MED ORDER — LEVOTHYROXINE SODIUM 100 MCG PO TABS
100.0000 ug | ORAL_TABLET | Freq: Every day | ORAL | Status: DC
  Administered 2024-07-21 – 2024-07-26 (×6): 100 ug via ORAL
  Filled 2024-07-21: qty 1
  Filled 2024-07-21: qty 2
  Filled 2024-07-21 (×4): qty 1

## 2024-07-21 MED ORDER — NITROGLYCERIN IN D5W 200-5 MCG/ML-% IV SOLN
0.0000 ug/min | INTRAVENOUS | Status: DC
  Administered 2024-07-21: 10 ug/min via INTRAVENOUS
  Administered 2024-07-22: 16.667 ug/min via INTRAVENOUS
  Filled 2024-07-21 (×4): qty 250

## 2024-07-21 MED ORDER — ENOXAPARIN SODIUM 30 MG/0.3ML IJ SOSY
30.0000 mg | PREFILLED_SYRINGE | INTRAMUSCULAR | Status: DC
  Administered 2024-07-21: 30 mg via SUBCUTANEOUS
  Filled 2024-07-21: qty 0.3

## 2024-07-21 MED ORDER — AMLODIPINE BESYLATE 10 MG PO TABS
10.0000 mg | ORAL_TABLET | Freq: Every day | ORAL | Status: DC
  Administered 2024-07-21 – 2024-07-25 (×5): 10 mg via ORAL
  Filled 2024-07-21: qty 1
  Filled 2024-07-21: qty 2
  Filled 2024-07-21 (×3): qty 1

## 2024-07-21 MED ORDER — IPRATROPIUM-ALBUTEROL 0.5-2.5 (3) MG/3ML IN SOLN: 3.0000 mL | Freq: Four times a day (QID) | RESPIRATORY_TRACT | Status: DC | PRN

## 2024-07-21 MED ORDER — INSULIN ASPART 100 UNIT/ML IJ SOLN
0.0000 [IU] | Freq: Three times a day (TID) | INTRAMUSCULAR | Status: DC
  Administered 2024-07-21 (×2): 9 [IU] via SUBCUTANEOUS
  Administered 2024-07-21: 7 [IU] via SUBCUTANEOUS
  Administered 2024-07-22: 3 [IU] via SUBCUTANEOUS
  Administered 2024-07-22: 9 [IU] via SUBCUTANEOUS
  Administered 2024-07-22: 5 [IU] via SUBCUTANEOUS
  Administered 2024-07-23 – 2024-07-24 (×4): 3 [IU] via SUBCUTANEOUS
  Filled 2024-07-21: qty 9
  Filled 2024-07-21: qty 7
  Filled 2024-07-21: qty 9
  Filled 2024-07-21 (×2): qty 3
  Filled 2024-07-21: qty 9
  Filled 2024-07-21 (×2): qty 3
  Filled 2024-07-21: qty 5
  Filled 2024-07-21: qty 3

## 2024-07-21 NOTE — Progress Notes (Signed)
 PHARMACY - ANTICOAGULATION CONSULT NOTE  Pharmacy Consult for [facility] Indication: chest pain/ACS  Allergies[1]  Patient Measurements: Height: 5' 6 (167.6 cm) Weight: 54.3 kg (119 lb 11.4 oz) IBW/kg (Calculated) : 59.3 HEPARIN  DW (KG): 54.3  Vital Signs: Temp: 97.4 F (36.3 C) (01/01 1320) Temp Source: Oral (01/01 1320) BP: 105/72 (01/01 1415) Pulse Rate: 70 (01/01 1415)  Labs: Recent Labs    07/21/24 0243  HGB 13.8  HCT 43.2  PLT 404*  CREATININE 1.67*    Estimated Creatinine Clearance: 24.6 mL/min (A) (by C-G formula based on SCr of 1.67 mg/dL (H)).   Medical History: Past Medical History:  Diagnosis Date   Acute pain of left shoulder 01/17/2019   Aortic atherosclerosis    CHF (congestive heart failure) (HCC) 09/16/2013   a.) TTE 09/16/2013: EF 55-60%; mod inferior HK, triv TR; G1DD. b.) TTE 01/02/2016: EF 25-30%; mid-apicalateroseptal, lat, inf, inferoseptal, apical akinesis; triv TR; G1DD. c. TTE 04/16/2016: EF 65%; G1DD. d.) TTE 05/02/2020: EF 50-55%; G2DD. e.) TTE 04/11/2021: EF 55%; G1DD.   Chronic low back pain with bilateral sciatica    Chronic painful diabetic neuropathy (HCC)    Chronic radicular lumbar pain    CKD (chronic kidney disease), stage III (HCC)    Clostridioides difficile infection 2015   COPD (chronic obstructive pulmonary disease) (HCC)    Coronary artery disease    a.) PCI 02/06/2011: 100% mLCx - DES x 3. b.) PCI 09/17/2011: 95% dRCA - DES x 1. c.) PCI 10/02/2011: 75% pLCX - DES x 1. d.) PCI 08/19/2012: DES x 2 p-mRCA.  e.) LHC 01/01/16: Takotsubo event 01/01/2016 with patent stents   Depression    Diverticulosis    Failed back syndrome    Frequent PVCs    a. Noted in hospital 12/2015.   GERD (gastroesophageal reflux disease)    History of bilateral cataract extraction 01/2015   History of heart artery stent    a.) TOTAL of 7 stents --> 02/06/2011 - overlapping 2.5 x 12 mm Xience V, 2.5 x 8 mm Xience V, 2.25 x 8 mm Xience Nano to mLCx;  09/17/2011 - 2.5 x 23 Xience V dRCA; 10/02/2011 - 2.5 x 15 mm Xience V pLCx; 08/09/2012 - overlapping 3.0 x 38mm mRCA and 3.5 x 23mm pRCA   Hyperlipidemia    Hypertension    Hypothyroidism    Impingement syndrome of left shoulder    LBBB (left bundle branch block)    Long term current use of clopidogrel     Marijuana abuse    Myocardial infarction [facility])    a.) multiple MIs;  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 12/2015 - nephew committed suicide 1 week prior, sister died the morning of presentation - initially called a STEMI; cath with patent stents. LVEF 25-30%.   Tendonitis of left rotator cuff    Tobacco abuse    Vaginal burning 03/22/2019   Vascular dementia     Medications:  (Not in a hospital  admission)   Assessment: 79 y/o F with a known h/o CAD and recent stent to LAD this month admitted with CP. Patient received Lovenox  this morning.   Goal of Therapy:  Heparin  level 0.3-0.7 units/ml Monitor platelets by anticoagulation protocol: Yes   Plan:  No bolus with recent Lovenox  Initiate heparin  infusion at 650 units/hr HL 8 hours after infusion start Daily HL/CBC  Bari Hamilton D 07/21/2024,2:16 PM      [1] No Known Allergies

## 2024-07-21 NOTE — ED Notes (Signed)
 Pt given a cup of ice water

## 2024-07-21 NOTE — ED Notes (Signed)
 Respiratory called to wean pt off of bipap per MD Paudel.

## 2024-07-21 NOTE — ED Notes (Signed)
 Permission from patient  to update son on patients condition

## 2024-07-21 NOTE — Progress Notes (Signed)
 Anticoagulation monitoring(Lovenox ):  79 yo female ordered Lovenox  40 mg Q24h    Filed Weights   07/21/24 0228 07/21/24 0311  Weight: 54.3 kg (119 lb 11.4 oz) 54.3 kg (119 lb 11.4 oz)   BMI 19.3    Lab Results  Component Value Date   CREATININE 1.67 (H) 07/21/2024   CREATININE 1.30 (H) 07/01/2024   CREATININE 1.49 (H) 06/21/2024   Estimated Creatinine Clearance: 24.6 mL/min (A) (by C-G formula based on SCr of 1.67 mg/dL (H)). Hemoglobin & Hematocrit     Component Value Date/Time   HGB 13.8 07/21/2024 0243   HGB 13.9 06/21/2024 1626   HCT 43.2 07/21/2024 0243   HCT 43.5 06/21/2024 1626     Per Protocol for Patient with estCrcl < 30 ml/min and BMI > 30, will transition to Lovenox  30 mg Q24h.

## 2024-07-21 NOTE — ED Provider Notes (Signed)
 "  [facility] Provider Note    Event Date/Time   First MD Initiated Contact with Patient 07/21/24 [phone]     (approximate)   History   Shortness of Breath (Patient states she has been sob all day and also had indigestion, has a respiratory virus for several days, s/p 3 cardiac stents 3 weeks ago, arrives on 15L )2 NR o2 sat 85%  Dr Gordan called to bed Resp called)  Level 5 caveat:  history/ROS limited by acute/critical illness  HPI Sarah Payne is a 79 y.o. female whose medical history includes both COPD and CHF and who just recently had some cardiac stents placed (about 3 weeks ago).  She presents for evaluation of shortness of breath that has gotten worse over the last day but got much worse about 30 minutes prior to arrival.  EMS reports that she is usually on 2 L but was satting in the 70s.  On 15 L they can get her up only to about 85% and the patient says she is feeling worse.  She is denying chest pain, just says that she cannot breathe.     Physical Exam   Triage Vital Signs: ED Triage Vitals  Encounter Vitals Group     BP 07/21/24 0229 (!) 204/143     Girls Systolic BP Percentile --      Girls Diastolic BP Percentile --      Boys Systolic BP Percentile --      Boys Diastolic BP Percentile --      Pulse Rate 07/21/24 0229 (!) 120     Resp 07/21/24 0229 (!) 35     Temp --      Temp src --      SpO2 07/21/24 0229 (!) 80 %     Weight 07/21/24 0228 54.3 kg (119 lb 11.4 oz)     Height --      Head Circumference --      Peak Flow --      Pain Score --      Pain Loc --      Pain Education --      Exclude from Growth Chart --     Most recent vital signs: Vitals:   07/21/24 0358 07/21/24 0400  BP: (!) 179/118 (!) 170/107  Pulse: 99 80  Resp: (!) 28 (!) 28  Temp: 98.1 F (36.7 C)   SpO2: 98%     General: Awake, alert, severe distress CV:  Tachycardia in the 120s, adequate peripheral perfusion Resp:  Severely increased respiratory effort  with intercostal retractions and accessory muscle usage.  Lung sounds are both tight with minimal air movement and sounded very wet with audible crackles even without the use of the stethoscope consistent with volume overload Abd:  No distention.  No tenderness to palpation of the abdomen.   ED Results / Procedures / Treatments   Labs (all labs ordered are listed, but only abnormal results are displayed) Labs Reviewed  BLOOD GAS, VENOUS - Abnormal; Notable for the following components:      Result Value   pO2, Ven 53 (*)    Acid-base deficit 4.3 (*)    All other components within normal limits  PRO BRAIN NATRIURETIC PEPTIDE - Abnormal; Notable for the following components:   Pro Brain Natriuretic Peptide 8,644.0 (*)    All other components within normal limits  CBC WITH DIFFERENTIAL/PLATELET - Abnormal; Notable for the following components:   WBC 15.9 (*)  Platelets 404 (*)    Neutro Abs 8.2 (*)    Lymphs Abs 6.7 (*)    Abs Immature Granulocytes 0.10 (*)    All other components within normal limits  COMPREHENSIVE METABOLIC PANEL WITH GFR - Abnormal; Notable for the following components:   CO2 20 (*)    Glucose, Bld 296 (*)    Creatinine, Ser 1.67 (*)    Alkaline Phosphatase 128 (*)    GFR, Estimated 31 (*)    Anion gap 16 (*)    All other components within normal limits  TROPONIN T, HIGH SENSITIVITY - Abnormal; Notable for the following components:   Troponin T High Sensitivity 162 (*)    All other components within normal limits  RESP PANEL BY RT-PCR (RSV, FLU A&B, COVID)  [id number]  TROPONIN T, HIGH SENSITIVITY     EKG  ED ECG REPORT I, Darleene Dome, the attending physician, personally viewed and interpreted this ECG.  Date: 07/21/2024 EKG Time: 2:26 AM Rate: 120 Rhythm: A-fib with RVR QRS Axis: normal Intervals: normal ST/T Wave abnormalities: Some ST depression in the setting of atypical left bundle branch block, worrisome for demand ischemia Narrative  Interpretation: Suggestive of ischemia; does not meet STEMI criteria.  ED ECG REPORT I, Darleene Dome, the attending physician, personally viewed and interpreted this ECG.  Date: 07/21/2024 EKG Time: 3:56 AM Rate: 99 Rhythm: A-fib with regular rate QRS Axis: normal Intervals: IVCD, likely atypical left bundle branch block ST/T Wave abnormalities: Non-specific ST segment / T-wave changes, but no clear evidence of acute ischemia. Narrative Interpretation: no definitive evidence of acute ischemia; does not meet STEMI criteria.   RADIOLOGY See ED course for details   PROCEDURES:  Critical Care performed: Yes, see critical care procedure note(s)  .Critical Care  Performed by: Dome Darleene, MD Authorized by: Dome Darleene, MD   Critical care provider statement:    Critical care time (minutes):  45   Critical care time was exclusive of:  Separately billable procedures and treating other patients   Critical care was necessary to treat or prevent imminent or life-threatening deterioration of the following conditions:  Respiratory failure   Critical care was time spent personally by me on the following activities:  Development of treatment plan with patient or surrogate, evaluation of patient's response to treatment, examination of patient, obtaining history from patient or surrogate, ordering and performing treatments and interventions, ordering and review of laboratory studies, ordering and review of radiographic studies, pulse oximetry, re-evaluation of patient's condition and review of old charts .1-3 Lead EKG Interpretation  Performed by: Dome Darleene, MD Authorized by: Dome Darleene, MD     Interpretation: abnormal     ECG rate:  120   ECG rate assessment: tachycardic     Rhythm: atrial fibrillation     Ectopy: none     Conduction: normal       IMPRESSION / MDM / ASSESSMENT AND PLAN / ED COURSE  I reviewed the triage vital signs and the nursing notes.                               Differential diagnosis includes, but is not limited to, CHF exacerbation/pulmonary edema, COPD exacerbation, ACS.  Patient's presentation is most consistent with acute presentation with potential threat to life or bodily function.  Labs/studies ordered: Chest x-ray, EKG, VBG, BNP, high-sensitivity troponin, CMP, CBC with differential, respiratory viral panel  Interventions/Medications given:  Medications  nitroGLYCERIN   50 mg in dextrose  5 % 250 mL (0.2 mg/mL) infusion (120 mcg/min Intravenous Infusion Verify 07/21/24 0315)  aspirin  EC tablet 81 mg (has no administration in time range)  atorvastatin  (LIPITOR ) tablet 80 mg (has no administration in time range)  ezetimibe  (ZETIA ) tablet 10 mg (has no administration in time range)  isosorbide  mononitrate (IMDUR ) 24 hr tablet 30 mg (has no administration in time range)  metoprolol  succinate (TOPROL -XL) 24 hr tablet 50 mg (has no administration in time range)  nitroGLYCERIN  (NITROSTAT ) SL tablet 0.4 mg (has no administration in time range)  mirtazapine  (REMERON ) tablet 15 mg (has no administration in time range)  levothyroxine  (SYNTHROID ) tablet 100 mcg (has no administration in time range)  clopidogrel  (PLAVIX ) tablet 75 mg (has no administration in time range)  umeclidinium-vilanterol (ANORO ELLIPTA ) 62.5-25 MCG/ACT 1 puff (has no administration in time range)  enoxaparin  (LOVENOX ) injection 40 mg (has no administration in time range)  acetaminophen  (TYLENOL ) tablet 650 mg (has no administration in time range)    Or  acetaminophen  (TYLENOL ) suppository 650 mg (has no administration in time range)  ondansetron  (ZOFRAN ) tablet 4 mg (has no administration in time range)    Or  ondansetron  (ZOFRAN ) injection 4 mg (has no administration in time range)  furosemide  (LASIX ) injection 40 mg (has no administration in time range)  insulin  aspart (novoLOG ) injection 0-9 Units (has no administration in time range)  insulin  aspart (novoLOG )  injection 0-5 Units (has no administration in time range)  albuterol  (PROVENTIL ) (2.5 MG/3ML) 0.083% nebulizer solution 2.5 mg (has no administration in time range)  ipratropium-albuterol  (DUONEB) 0.5-2.5 (3) MG/3ML nebulizer solution 3 mL (3 mLs Nebulization Given 07/21/24 0249)  ipratropium-albuterol  (DUONEB) 0.5-2.5 (3) MG/3ML nebulizer solution 3 mL (3 mLs Nebulization Given 07/21/24 0250)  ipratropium-albuterol  (DUONEB) 0.5-2.5 (3) MG/3ML nebulizer solution 3 mL (3 mLs Nebulization Given 07/21/24 0250)  methylPREDNISolone  sodium succinate (SOLU-MEDROL ) 125 mg/2 mL injection 125 mg (125 mg Intravenous Given 07/21/24 0250)  furosemide  (LASIX ) injection 40 mg (40 mg Intravenous Given 07/21/24 0419)    (Note:  hospital course my include additional interventions and/or labs/studies not listed above.)   Strongly suspect flash pulmonary edema and CHF exacerbation, but COPD exacerbation is very possible also given the tightness of her breath sounds in her history.  Blood pressure is 204/143, verified with several measurements, and she sounds wet with crackles throughout.  I am ordering nitroglycerin  infusion to be started at 100 mcg/min and titrated up to reduce her preload.  I have also started her on BiPAP as she was satting in the low 80s with a nonrebreather which she was not tolerating well.  I verified with her that she is full code and wants to be intubated if necessary.  Hopefully she can turn around with BiPAP but she may require intubation and we are preparing the airway equipment.  I will hold off on furosemide  until seeing her chest x-ray but I think reducing preload is imperative right now, but I will stop the nitroglycerin  if she requires intubation.  I have ordered 3 DuoNebs to be given inline with BiPAP as well as Solu-Medrol  125 mg IV.  The patient is on the cardiac monitor to evaluate for evidence of arrhythmia and/or significant heart rate changes.   Clinical Course as of 07/21/24 0428  Thu  Jul 21, 2024  0317 Reassessed patient and she is feeling much better on BiPAP, breathing easily and comfortably, and able to speak in full sentences through the BiPAP.  Nitroglycerin  has been  initiated. [CF]  0348 WBC(!): 15.9 [CF]  0348 Blood gas, venous(!) Reassuring VBG with no severe acidosis nor CO2 retention [CF]  0349 DG Chest Portable 1 View I independently viewed and interpreted the patient's chest x-ray(s), and I also reviewed the radiologist's report.   [CF]  0349 Resp panel by RT-PCR (RSV, Flu A&B, Covid) Anterior Nasal Swab Negative respiratory viral panel [CF]  0350 Ordering IV Lasix  40 mg IV [CF]  0350 Troponin T High Sensitivity(!!): 162 Given the patient's lack of chest pain (which I verified when I reassessed her), I think that most likely this represents demand ischemia.  I will make the hospitalist aware but will not treat as NSTEMI [CF]  0351 Pro Brain Natriuretic Peptide(!): 8,644.0 Substantially elevated BNP consistent with a working diagnosis of CHF exacerbation [CF]  0351 Comprehensive metabolic panel(!) Reassuring CMP [CF]  9648 I am consulting the hospitalist team for admission.   [CF]  [phone] I consulted by phone with the admitting hospitalist, and they will admit the patient - Dr. Cleatus [CF]    Clinical Course User Index [CF] Gordan Huxley, MD     FINAL CLINICAL IMPRESSION(S) / ED DIAGNOSES   Final diagnoses:  Acute respiratory failure with hypoxia (HCC)  Acute on chronic congestive heart failure, unspecified heart failure type (HCC)  Demand ischemia (HCC)  Elevated troponin level     Rx / DC Orders   ED Discharge Orders     None        Note:  This document was prepared using Dragon voice recognition software and may include unintentional dictation errors.   Gordan Huxley, MD 07/21/24 [phone]  "

## 2024-07-21 NOTE — Progress Notes (Signed)
 Pt was taken off of Bipap per MD.  Pt was placed on 6L/Rich-Bubble.  RN informed.  Pt is in no respiratory distress at this time

## 2024-07-21 NOTE — ED Triage Notes (Signed)
 Patient states she has been sob all day and also had indigestion, has a respiratory virus for several days, s/p 3 cardiac stents 3 weeks ago, arrives on 15L )2 NR o2 sat 85%  Dr Gordan called to bed Resp called

## 2024-07-21 NOTE — Consult Note (Addendum)
 "  Cardiology Consultation   Patient ID: Sarah Payne MRN: [id number]; DOB: April 26, 1946  Admit date: 07/21/2024 Date of Consult: 07/21/2024  PCP:  Gretta Comer MARLA, NP   [facility] Providers Cardiologist:  Evalene Lunger, MD  Physician requesting consult: Dr. Roann Reason for consult: Shortness of breath  Patient Profile: Sarah Payne is a 79 y.o. female with a hx of smoking,  coronary artery disease,  prior stent x3 placed to the mid left circumflex with moderate to severe LAD disease  stent placed to her distal RCA in February 2013,  stent to the mid left circumflex in March 2013 ,  poorly controlled diabetes with hemoglobin A1c of 10,  Depression/anxiety hypertension,  cardiac catheterization 08/19/2012 showing moderate mid LAD disease at the takeoff of the diagonal vessel, severe ostial to mid RCA disease, ejection fraction greater than 55%. She had FFR pressure wire showing severe disease of the RCA with drug-eluting stent x2 placed. C. difficile late 2015 after antibiotics Previous history of running out of her medications cardiac catheterization performed showing patent stents 12/2015 Cardiac catheterization October 2025, three-vessel disease noted June 21, 2024 stent placed to proximal LAD Who presents with worsening shortness of breath    History of Present Illness: Sarah Payne reports worsening shortness of breath for several days, respiratory virus Presented to the emergency room on 15 L nasal cannula saturations 85% Prior history COPD exacerbation, long smoking history Denied chest pain on arrival, main complaint was shortness of breath Blood pressure 200 systolic on arrival with heart rate 120s BNP 8600, WBC 15.9 creatinine 1.67 Troponin 162 Limited respiratory panel negative  EKG showing wide-complex tachycardia concerning for atrial fib/flutter rate 120 bpm left bundle branch block Compared to prior EKG July 04, 2024, rhythm has changed Repeat  EKG showing normal sinus rhythm with left bundle  Was started on BiPAP in the ER, wean to nasal cannula Received IV Lasix  in the ER  Past Medical History:  Diagnosis Date   Acute pain of left shoulder 01/17/2019   Aortic atherosclerosis    CHF (congestive heart failure) (HCC) 09/16/2013   a.) TTE 09/16/2013: EF 55-60%; mod inferior HK, triv TR; G1DD. b.) TTE 01/02/2016: EF 25-30%; mid-apicalateroseptal, lat, inf, inferoseptal, apical akinesis; triv TR; G1DD. c. TTE 04/16/2016: EF 65%; G1DD. d.) TTE 05/02/2020: EF 50-55%; G2DD. e.) TTE 04/11/2021: EF 55%; G1DD.   Chronic low back pain with bilateral sciatica    Chronic painful diabetic neuropathy (HCC)    Chronic radicular lumbar pain    CKD (chronic kidney disease), stage III (HCC)    Clostridioides difficile infection 2015   COPD (chronic obstructive pulmonary disease) (HCC)    Coronary artery disease    a.) PCI 02/06/2011: 100% mLCx - DES x 3. b.) PCI 09/17/2011: 95% dRCA - DES x 1. c.) PCI 10/02/2011: 75% pLCX - DES x 1. d.) PCI 08/19/2012: DES x 2 p-mRCA.  e.) LHC 01/01/16: Takotsubo event 01/01/2016 with patent stents   Depression    Diverticulosis    Failed back syndrome    Frequent PVCs    a. Noted in hospital 12/2015.   GERD (gastroesophageal reflux disease)    History of bilateral cataract extraction 01/2015   History of heart artery stent    a.) TOTAL of 7 stents --> 02/06/2011 - overlapping 2.5 x 12 mm Xience V, 2.5 x 8 mm Xience V, 2.25 x 8 mm Xience Nano to mLCx; 09/17/2011 - 2.5 x 23 Xience V dRCA; 10/02/2011 - 2.5 x  15 mm Xience V pLCx; 08/09/2012 - overlapping 3.0 x 38mm mRCA and 3.5 x 23mm pRCA   Hyperlipidemia    Hypertension    Hypothyroidism    Impingement syndrome of left shoulder    LBBB (left bundle branch block)    Long term current use of clopidogrel     Marijuana abuse    Myocardial infarction [facility])    a.) multiple MIs;  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 12/2015 - nephew committed suicide 1 week prior, sister died the morning of presentation - initially called a STEMI; cath with patent stents. LVEF 25-30%.   Tendonitis of left rotator cuff    Tobacco abuse    Vaginal burning 03/22/2019   Vascular dementia     Past Surgical History:  Procedure Laterality Date   ABDOMINAL AORTOGRAM W/LOWER EXTREMITY N/A 09/12/2020   Procedure: ABDOMINAL AORTOGRAM W/LOWER EXTREMITY;  Surgeon: Darron Deatrice LABOR, MD;  Location: [facility] INVASIVE CV LAB;  Service: Cardiovascular;  Laterality: N/A;   ABDOMINAL HYSTERECTOMY     APPENDECTOMY     BACK SURGERY     CARDIAC CATHETERIZATION N/A 01/01/2016   Procedure: Left Heart Cath and Coronary Angiography;  Surgeon: Candyce GORMAN Reek, MD;  Location: [facility] INVASIVE CV LAB;  Service: Cardiovascular;  Laterality: N/A;   CATARACT EXTRACTION W/PHACO Left 01/30/2015   Procedure: CATARACT EXTRACTION PHACO AND INTRAOCULAR LENS PLACEMENT (IOC);  Surgeon: Elsie Carmine, MD;  Location: [facility] ORS;  Service: Ophthalmology;  Laterality: Left;  US  00:47    CATARACT EXTRACTION W/PHACO Right 02/13/2015   Procedure: CATARACT EXTRACTION PHACO AND INTRAOCULAR LENS PLACEMENT (IOC);  Surgeon: Elsie Carmine, MD;  Location: [facility] ORS;  Service: Ophthalmology;  Laterality: Right;  cassette lot # [id number] H US   00:29.9 AP  20.7 CDE  6.20   COLONOSCOPY N/A 11/02/2014   Procedure: COLONOSCOPY;  Surgeon: Lamar JONETTA Aho, MD;  Location: [facility] ENDOSCOPY;  Service: Endoscopy;  Laterality: N/A;   CORONARY ANGIOPLASTY WITH STENT PLACEMENT Left 02/06/2011   Procedure: CORONARY  ANGIOPLASTY WITH STENT PLACEMENT; Location: [facility]; Surgeon: Margie Lovelace, MD   CORONARY ANGIOPLASTY WITH STENT PLACEMENT Left 09/17/2011   Procedure: CORONARY ANGIOPLASTY WITH STENT PLACEMENT; Location: [facility]; Surgeon: Margie Lovelace, MD   CORONARY ANGIOPLASTY WITH STENT PLACEMENT Left 10/02/2011   Procedure: CORONARY ANGIOPLASTY WITH STENT PLACEMENT; Location: [facility]; Surgeon: Margie Lovelace, MD   CORONARY ANGIOPLASTY WITH STENT PLACEMENT Left 08/19/2012   Procedure: CORONARY ANGIOPLASTY WITH STENT PLACEMENT; Location: [facility]; Surgeon: Deatrice Darron, MD   CORONARY IMAGING/OCT N/A 06/30/2024   Procedure: CORONARY IMAGING/OCT;  Surgeon: Darron Deatrice LABOR, MD;  Location: [facility];  Service: Cardiovascular;  Laterality: N/A;   CORONARY LITHOTRIPSY N/A 06/30/2024   Procedure: CORONARY LITHOTRIPSY;  Surgeon: Darron Deatrice LABOR, MD;  Location: [facility];  Service: Cardiovascular;  Laterality: N/A;   CORONARY STENT INTERVENTION N/A 06/30/2024   Procedure: CORONARY STENT INTERVENTION;  Surgeon: Darron Deatrice LABOR, MD;  Location: [facility];  Service: Cardiovascular;  Laterality: N/A;   ESOPHAGOGASTRODUODENOSCOPY (EGD) WITH PROPOFOL  N/A 09/21/2018   Procedure: ESOPHAGOGASTRODUODENOSCOPY (EGD) WITH PROPOFOL ;  Surgeon: Therisa Bi, MD;  Location: [facility] ENDOSCOPY;  Service: Gastroenterology;  Laterality: N/A;   ESOPHAGOGASTRODUODENOSCOPY (EGD) WITH PROPOFOL  N/A 10/06/2022   Procedure: ESOPHAGOGASTRODUODENOSCOPY (EGD) WITH PROPOFOL ;  Surgeon: Therisa Bi, MD;  Location: [facility]COPY;  Service: Gastroenterology;  Laterality: N/A;   EYE SURGERY     LEFT HEART CATH AND CORONARY ANGIOGRAPHY Left 08/02/2014   Procedure: LEFT HEART CATH AND CORONARY ANGIOGRAPHY; Location: [facility]; Surgeon: Alm Clay, MD   PERIPHERAL VASCULAR ATHERECTOMY Left 09/12/2020   Procedure: PERIPHERAL VASCULAR ATHERECTOMY;  Surgeon: Darron Deatrice LABOR, MD;  Location[facility] INVASIVE CV LAB;  Service:  Cardiovascular;  Laterality: Left;   PERIPHERAL VASCULAR BALLOON ANGIOPLASTY  09/12/2020   Procedure: PERIPHERAL VASCULAR BALLOON ANGIOPLASTY;  Surgeon: Darron Deatrice LABOR, MD;  Location: [facility];  Service: Cardiovascular;;   REVERSE SHOULDER ARTHROPLASTY Left 10/01/2021   Procedure: REVERSE SHOULDER ARTHROPLASTY WITH BICEPS TENODESIS.;  Surgeon: Edie Norleen PARAS, MD;  Location: [facility] ORS;  Service: Orthopedics;  Laterality: Left;   RIGHT/LEFT HEART CATH AND CORONARY ANGIOGRAPHY Bilateral 05/16/2024   Procedure: RIGHT/LEFT HEART CATH AND CORONARY ANGIOGRAPHY;  Surgeon: Darron Deatrice LABOR, MD;  Location: [facility] INVASIVE CV LAB;  Service: Cardiovascular;  Laterality: Bilateral;   SHOULDER SURGERY Left 2017   THORACIC LAMINECTOMY FOR SPINAL CORD STIMULATOR N/A 10/27/2023   Procedure: THORACIC LAMINECTOMY FOR SPINAL CORD STIMULATOR PLACEMENT WITH INTERNAL PULSE GENERATOR IMPLANT;  Surgeon: Claudene Penne ORN, MD;  Location: [facility] ORS;  Service: Neurosurgery;  Laterality: N/A;  THORACIC LAMINECTOMY FOR SPINAL CORD STIMULATOR PLACEMENT WITH INTERNAL PULSE GENERATOR IMPLANT     Home Medications:  Prior to Admission medications  Medication Sig Start Date End Date Taking? Authorizing Provider  acetaminophen  (TYLENOL ) 650 MG CR tablet Take 1,300 mg by mouth every 8 (eight) hours as needed for pain.   Yes [provider]  albuterol  (VENTOLIN  HFA) 108 (90 Base) MCG/ACT inhaler Inhale 1-2 puffs into the lungs every 6 (six) hours as needed for wheezing or shortness of breath.   Yes [provider]  amLODipine  (NORVASC ) 10 MG tablet Take 1 tablet (10 mg total) by mouth daily. 04/13/24 04/13/25 Yes Viviann Pastor, MD  aspirin  EC 81 MG tablet Take 1 tablet (81 mg total) by mouth daily. Swallow whole. 05/16/24 05/16/25 Yes Darron Deatrice LABOR, MD  atorvastatin  (LIPITOR ) 80 MG tablet Take 1 tablet (80 mg total) by mouth daily. for cholesterol. 04/28/24  Yes Clark, Katherine K, NP  clobetasol  cream  (TEMOVATE ) 0.05 % Apply 1 Application topically 2 (two) times daily as needed. For vaginal itching. 03/24/24  Yes Clark, Katherine K, NP  clopidogrel  (PLAVIX ) 75 MG tablet Take 1 tablet (75 mg total) by mouth daily. 06/21/24  Yes Darron Deatrice LABOR, MD  ezetimibe  (ZETIA ) 10 MG tablet Take 1 tablet (10 mg total) by mouth daily. For cholesterol. 04/28/24  Yes Clark, Katherine K, NP  furosemide  (LASIX ) 20 MG tablet Take 1 tablet (20 mg total) by mouth daily as needed (As needed for weight gain greater than 3 pounds overnight or increased shortness of breath.). Take one tab only on Monday, Wednesday, & Fridays Patient taking differently: Take 20 mg by mouth every Monday, Wednesday, and Friday. 04/28/24  Yes Lorene Sinclair L, PA-C  gabapentin  (NEURONTIN ) 300 MG capsule Take 1 capsule (300 mg total) by mouth daily. May take an additional dose throughout the day if needed for pain Patient taking differently: Take 300  mg by mouth daily as needed (Pain). 05/24/24  Yes Gretta Comer POUR, NP  Glucagon  (GVOKE HYPOPEN  2-PACK) 1 MG/0.2ML SOAJ Inject 1 mg (1 pen) as needed for severe low blood sugar (sustained glucose less than 55 despite oral glucose treatments). May repeat in 15 minutes as needed. 11/20/22  Yes Clark, Katherine K, NP  insulin  aspart (NOVOLOG  FLEXPEN) 100 UNIT/ML FlexPen INJECT 5 UNITS SUBCUTANEOUSLY THREE TIMES DAILY WITH MEALS PER SLIDING SCALE (FOR DIABETES) 06/24/24  Yes Gretta Comer POUR, NP  isosorbide  mononitrate (IMDUR ) 30 MG 24 hr tablet Take 1 tablet (30 mg total) by mouth daily. 04/29/24  Yes Romanita Fager J, MD  levothyroxine  (SYNTHROID ) 100 MCG tablet TAKE 1 TABLET BY MOUTH IN THE MORNING ON  AN  EMPTY  STOMACH  WITH  WATER   ONLY.  NO  FOOD  OR  OTHER  MEDICATIONS  FOR  30  MINUTES 04/21/24  Yes Clark, Katherine K, NP  metoprolol  succinate (TOPROL -XL) 50 MG 24 hr tablet Take 1 tablet (50 mg total) by mouth daily. Take with or immediately following a meal. 07/01/24  Yes Henry Manuelita NOVAK, NP   mirtazapine  (REMERON ) 15 MG tablet Take 1 tablet (15 mg total) by mouth at bedtime. For sleep Patient taking differently: Take 15 mg by mouth at bedtime as needed (sleep). 04/28/24  Yes Clark, Katherine K, NP  nitroGLYCERIN  (NITROSTAT ) 0.4 MG SL tablet Place 1 tablet (0.4 mg total) under the tongue every 5 (five) minutes as needed for chest pain. 05/16/24 05/16/25 Yes Darron Deatrice LABOR, MD  ondansetron  (ZOFRAN ) 4 MG tablet Take 1 tablet (4 mg total) by mouth every 6 (six) hours as needed for nausea. 04/28/24  Yes Clark, Katherine K, NP  potassium chloride  (KLOR-CON  M) 10 MEQ tablet Take 10 mEq by mouth every Monday, Wednesday, and Friday.   Yes [provider]  sitaGLIPtin  (JANUVIA ) 100 MG tablet Take 1 tablet (100 mg total) by mouth daily. for diabetes. 09/03/23  Yes Clark, Katherine K, NP  umeclidinium-vilanterol (ANORO ELLIPTA ) 62.5-25 MCG/ACT AEPB Inhale 1 puff into the lungs daily. 05/25/24 05/25/25 Yes Dgayli, Belva, MD  Continuous Glucose Receiver (FREESTYLE LIBRE 2 READER) DEVI Use with sensor to check blood sugars 6 times daily. 03/24/24   Gretta Comer POUR, NP  Continuous Glucose Sensor (FREESTYLE LIBRE 2 SENSOR) MISC USE TO CHECK BLOOD SUGAR CHANGE EVERY 14 DAYS 06/24/24   Clark, Katherine K, NP  Insulin  Pen Needle (INSUPEN PEN NEEDLES) 32G X 4 MM MISC Use to inject insulin  4 times daily. 07/17/20   Clark, Katherine K, NP  nicotine  polacrilex (NICOTINE  MINI) 2 MG lozenge Take 1 lozenge (2 mg total) by mouth every 2 (two) hours as needed for smoking cessation. Patient not taking: Reported on 06/27/2024 05/25/24 08/23/24  Isadora Belva, MD    Scheduled Meds:  amLODipine   10 mg Oral Daily   aspirin  EC  81 mg Oral Daily   atorvastatin   80 mg Oral Daily   clopidogrel   75 mg Oral Daily   enoxaparin  (LOVENOX ) injection  30 mg Subcutaneous Q24H   ezetimibe   10 mg Oral Daily   furosemide   40 mg Intravenous BID   insulin  aspart  0-5 Units Subcutaneous QHS   insulin  aspart  0-9 Units  Subcutaneous TID WC   isosorbide  mononitrate  30 mg Oral Daily   levothyroxine   100 mcg Oral Q0600   metoprolol  succinate  50 mg Oral Daily   umeclidinium-vilanterol  1 puff Inhalation Daily   Continuous Infusions:  nitroGLYCERIN  30 mcg/min (  07/21/24 1115)   PRN Meds: acetaminophen  **OR** acetaminophen , hydrALAZINE , ipratropium-albuterol , mirtazapine , morphine  injection, nitroGLYCERIN , ondansetron  **OR** ondansetron  (ZOFRAN ) IV, oxyCODONE   Allergies:   Allergies[1]  Social History:   Social History   Socioeconomic History   Marital status: Married    Spouse name: Laurier   Number of children: Not on file   Years of education: Not on file   Highest education level: Not on file  Occupational History   Not on file  Tobacco Use   Smoking status: Every Day    Current packs/day: 1.00    Average packs/day: 1 pack/day for 45.0 years (45.0 ttl pk-yrs)    Types: Cigarettes   Smokeless tobacco: Never   Tobacco comments:    Has cut back, trying to quit. 1 pack/day per patient.  Vaping Use   Vaping status: Some Days   Substances: Nicotine   Substance and Sexual Activity   Alcohol use: No    Alcohol/week: 0.0 standard drinks of alcohol   Drug use: Not Currently   Sexual activity: Not on file  Other Topics Concern   Not on file  Social History Narrative   Lives at home with her husband in [place].  Previously used marijuana - quit.      Regular exercise: no/ pain from a frozen rotator cuff   Caffeine use: coffee daily and pepsi      Does not have a living will.   Daughters and husband know her wishes- would desire CPR but not prolonged life support if futile   Social Drivers of Health   Tobacco Use: High Risk (07/21/2024)   Patient History    Smoking Tobacco Use: Every Day    Smokeless Tobacco Use: Never    Passive Exposure: Not on file  Financial Resource Strain: Low Risk (10/22/2023)   Overall Financial Resource Strain (CARDIA)    Difficulty of Paying Living Expenses: Not  hard at all  Food Insecurity: No Food Insecurity (06/30/2024)   Epic    Worried About Programme Researcher, Broadcasting/film/video in the Last Year: Never true    Ran Out of Food in the Last Year: Never true  Transportation Needs: No Transportation Needs (06/30/2024)   Epic    Lack of Transportation (Medical): No    Lack of Transportation (Non-Medical): No  Physical Activity: Insufficiently Active (10/22/2023)   Exercise Vital Sign    Days of Exercise per Week: 3 days    Minutes of Exercise per Session: 20 min  Stress: No Stress Concern Present (10/22/2023)   [employer] of Occupational Health - Occupational Stress Questionnaire    Feeling of Stress : Only a little  Social Connections: Moderately Isolated (06/30/2024)   Social Connection and Isolation Panel    Frequency of Communication with Friends and Family: More than three times a week    Frequency of Social Gatherings with Friends and Family: Once a week    Attends Religious Services: Never    Database Administrator or Organizations: No    Attends Banker Meetings: Never    Marital Status: Married  Catering Manager Violence: Not At Risk (06/30/2024)   Epic    Fear of Current or Ex-Partner: No    Emotionally Abused: No    Physically Abused: No    Sexually Abused: No  Depression (PHQ2-9): Medium Risk (06/24/2024)   Depression (PHQ2-9)    PHQ-2 Score: 10  Alcohol Screen: Low Risk (10/22/2023)   Alcohol Screen    Last Alcohol Screening Score (AUDIT): 0  Housing:  Low Risk (06/30/2024)   Epic    Unable to Pay for Housing in the Last Year: No    Number of Times Moved in the Last Year: 0    Homeless in the Last Year: No  Utilities: Not At Risk (06/30/2024)   Epic    Threatened with loss of utilities: No  Health Literacy: Adequate Health Literacy (10/22/2023)   B1300 Health Literacy    Frequency of need for help with medical instructions: Never    Family History:    Family History  Problem Relation Age of Onset   Heart attack Mother         First MI @ 67 - Died @ 59   Heart disease Mother    Heart disease Father        Died @ 30   Throat cancer Brother    Liver cancer Brother    Colon cancer Sister      ROS:  Please see the history of present illness.  Review of Systems  Constitutional: Negative.   HENT: Negative.    Respiratory:  Positive for shortness of breath.   Cardiovascular: Negative.   Gastrointestinal: Negative.   Musculoskeletal: Negative.   Neurological: Negative.   Psychiatric/Behavioral: Negative.    All other systems reviewed and are negative.   Physical Exam/Data: Vitals:   07/21/24 0832 07/21/24 0917 07/21/24 1020 07/21/24 1021  BP: (!) 154/89  (!) 138/99   Pulse: 94  81 85  Resp: (!) 31  18 (!) 21  Temp:  (!) 97.4 F (36.3 C)    TempSrc:  Oral    SpO2:      Weight:      Height:        Intake/Output Summary (Last 24 hours) at 07/21/2024 1140 Last data filed at 07/21/2024 0315 Gross per 24 hour  Intake 0.28 ml  Output --  Net 0.28 ml      07/21/2024    3:11 AM 07/21/2024    2:28 AM 06/30/2024    5:58 AM  Last 3 Weights  Weight (lbs) 119 lb 11.4 oz 119 lb 11.4 oz 128 lb  Weight (kg) 54.3 kg 54.3 kg 58.06 kg     Body mass index is 19.32 kg/m.  General:  Well nourished, well developed, in no acute distress HEENT: normal Neck: no JVD Vascular: No carotid bruits; Distal pulses 2+ bilaterally Cardiac:  normal S1, S2; RRR; no murmur  Lungs: Coarse breath sounds Abd: soft, nontender, no hepatomegaly  Ext: no edema Musculoskeletal:  No deformities, BUE and BLE strength normal and equal Skin: warm and dry  Neuro:  CNs 2-12 intact, no focal abnormalities noted Psych:  Normal affect   EKG:  The EKG was personally reviewed and demonstrates: Wide-complex tachycardia concerning for atrial fibs/flutter rate 120 Repeat EKG with normal sinus rhythm Telemetry:  Telemetry was personally reviewed and demonstrates: Normal sinus rhythm  Relevant CV Studies: Echo pending  Laboratory  Data: High Sensitivity Troponin:  No results for input(s): TROPONINIHS in the last 720 hours.  Recent Labs  Lab 07/21/24 0243 07/21/24 0441  TRNPT 162* 134*      Chemistry Recent Labs  Lab 07/21/24 0243  NA 138  K 4.3  CL 102  CO2 20*  GLUCOSE 296*  BUN 19  CREATININE 1.67*  CALCIUM  9.7  GFRNONAA 31*  ANIONGAP 16*    Recent Labs  Lab 07/21/24 0243  PROT 7.6  ALBUMIN 3.9  AST 24  ALT 14  ALKPHOS 128*  BILITOT  0.3   Lipids No results for input(s): CHOL, TRIG, HDL, LABVLDL, LDLCALC, CHOLHDL in the last 168 hours.  Hematology Recent Labs  Lab 07/21/24 0243  WBC 15.9*  RBC 4.69  HGB 13.8  HCT 43.2  MCV 92.1  MCH 29.4  MCHC 31.9  RDW 14.3  PLT 404*   Thyroid  No results for input(s): TSH, FREET4 in the last 168 hours.  BNP Recent Labs  Lab 07/21/24 0243  PROBNP 8,644.0*    DDimer No results for input(s): DDIMER in the last 168 hours.  Radiology/Studies:  DG Chest Portable 1 View Result Date: 07/21/2024 EXAM: 1 VIEW(S) XRAY OF THE CHEST 07/21/2024 02:51:00 AM COMPARISON: 04/26/2024 CLINICAL HISTORY: acute dyspnea with hypoxia, suspect CHF FINDINGS: LINES, TUBES AND DEVICES: Stable thoracic spinal stimulator device. LUNGS AND PLEURA: Diffuse bilateral interstitial infiltrate with superimposed extensive airspace infiltrate within the perihilar and infrahilar right lung is in keeping with moderate-to-severe, asymmetric pulmonary edema. No pleural effusion. No pneumothorax. HEART AND MEDIASTINUM: Mild cardiomegaly. Calcified aortic atherosclerosis. BONES AND SOFT TISSUES: Left shoulder arthroplasty. No acute osseous abnormality. IMPRESSION: 1. Moderate-to-severe asymmetric pulmonary edema, more severe within the right lung. 2. Mild cardiomegaly. 3. Stable thoracic spinal stimulator device. 4. Left shoulder arthroplasty. Electronically signed by: Dorethia Molt MD 07/21/2024 03:27 AM EST RP Workstation: [id number]     Assessment and Plan: Acute on  chronic respiratory failure with hypoxia Long smoking history, no COPD -Recent viral syndrome - Unable to exclude component of acute on chronic CHF - Continue IV Lasix  twice daily Unclear medication compliance, presenting with hypertensive urgency, blood pressure improving  Coronary artery disease with stable angina Recent stent placement to LAD December 2025 -On nitro infusion Reports having mild chest discomfort - elevated   troponin in the setting of hypoxia, tachycardia, known three-vessel disease Tnt 421, 336 - Continue aspirin  Plavix  statin metoprolol  succinate, isosorbide  - Repeat troponin ordered  Addendum------> Echocardiogram detailing severe drop in ejection fraction 25 to 30% - Will start heparin  infusion - Discussed with interventional cardiology with a repeat cardiac catheterization may be indicated  COPD exacerbation On inhalers, nebulizers If symptoms persist may benefit from steroids  For questions or updates, please contact [facility] Please consult [email or web address] for contact info under    Signed, Mikhaela Zaugg, MD  07/21/2024 11:40 AM      [1] No Known Allergies  "

## 2024-07-21 NOTE — ED Notes (Signed)
 Respiratory called at 0230, patient placed on BiPap 100%, O2 sat up to 92%

## 2024-07-21 NOTE — ED Notes (Signed)
MD Paudel at bedside.

## 2024-07-21 NOTE — Progress Notes (Signed)
*  PRELIMINARY RESULTS* Echocardiogram 2D Echocardiogram has been performed.  Sarah Payne 07/21/2024, 1:44 PM

## 2024-07-21 NOTE — H&P (Signed)
 "  History and Physical    Sarah Payne FMW:[id number] DOB: [birth year] DOA: 07/21/2024  DOS: the patient was seen and examined on 07/21/2024  PCP: Gretta Comer MARLA, NP   Patient coming from: Home  I have personally briefly reviewed patient's old medical records in [facility]  Chief Complaint: Shortness of breath  HPI: Sarah Payne is a pleasant 79 y.o. female with medical history significant for CAD s/p recent PCI, COPD on 2 L home oxygen, CHF, diabetes, GERD, HTN, HLD, hypothyroidism who came into ED complaining of shortness of breath that has gotten worse over 1 to 2 days.  She stated that this got worse within 1 hour of arrival to emergency room.  EMS reported that she usually wears 2 L of oxygen but was saturating only 70% and 15 L nonrebreather was placed saturating only 85%.  She denied any chest pain, palpitations, fever, chills, nausea, vomiting, hematuria, dysuria.  ED Course: Upon arrival to the ED, patient is found to be hypoxemic with only 85% on nonrebreather 15 L, hypertensive at 204/143, tachycardic at 120, tachypneic at 35, white count was 15.9, proBNP was 8644, bicarb was 20, creatinine 1.67, anion gap of 16 , Troponin was 162, COVID flu and RSV negative.  Chest x-ray showed flash pulmonary edema.  Patient was given IV diuretics Lasix  40 mg x 1, placed on BiPAP, oxygen, hospitalist service was consulted for evaluation for admission for acute hypoxemic respiratory failure due to pulmonary edema. Review of Systems:  ROS  All other systems negative except as noted in the HPI.  Past Medical History:  Diagnosis Date   Acute pain of left shoulder 01/17/2019   Aortic atherosclerosis    CHF (congestive heart failure) (HCC) 09/16/2013   a.) TTE 09/16/2013: EF 55-60%; mod inferior HK, triv TR; G1DD. b.) TTE 01/02/2016: EF 25-30%; mid-apicalateroseptal, lat, inf, inferoseptal, apical akinesis; triv TR; G1DD. c. TTE 04/16/2016: EF 65%; G1DD. d.) TTE 05/02/2020: EF 50-55%; G2DD.  e.) TTE 04/11/2021: EF 55%; G1DD.   Chronic low back pain with bilateral sciatica    Chronic painful diabetic neuropathy (HCC)    Chronic radicular lumbar pain    CKD (chronic kidney disease), stage III (HCC)    Clostridioides difficile infection 2015   COPD (chronic obstructive pulmonary disease) (HCC)    Coronary artery disease    a.) PCI 02/06/2011: 100% mLCx - DES x 3. b.) PCI 09/17/2011: 95% dRCA - DES x 1. c.) PCI 10/02/2011: 75% pLCX - DES x 1. d.) PCI 08/19/2012: DES x 2 p-mRCA.  e.) LHC 01/01/16: Takotsubo event 01/01/2016 with patent stents   Depression    Diverticulosis    Failed back syndrome    Frequent PVCs    a. Noted in hospital 12/2015.   GERD (gastroesophageal reflux disease)    History of bilateral cataract extraction 01/2015   History of heart artery stent    a.) TOTAL of 7 stents --> 02/06/2011 - overlapping 2.5 x 12 mm Xience V, 2.5 x 8 mm Xience V, 2.25 x 8 mm Xience Nano to mLCx; 09/17/2011 - 2.5 x 23 Xience V dRCA; 10/02/2011 - 2.5 x 15 mm Xience V pLCx; 08/09/2012 - overlapping 3.0 x 38mm mRCA and 3.5 x 23mm pRCA   Hyperlipidemia    Hypertension    Hypothyroidism    Impingement syndrome of left shoulder    LBBB (left bundle branch block)    Long term current use of clopidogrel     Marijuana abuse  Myocardial infarction [facility])    a.) multiple MIs;  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 12/2015 - nephew committed suicide 1 week prior, sister died the morning of presentation - initially called a STEMI; cath with patent stents. LVEF 25-30%.    Tendonitis of left rotator cuff    Tobacco abuse    Vaginal burning 03/22/2019   Vascular dementia     Past Surgical History:  Procedure Laterality Date   ABDOMINAL AORTOGRAM W/LOWER EXTREMITY N/A 09/12/2020   Procedure: ABDOMINAL AORTOGRAM W/LOWER EXTREMITY;  Surgeon: Darron Deatrice LABOR, MD;  Location: [facility] INVASIVE CV LAB;  Service: Cardiovascular;  Laterality: N/A;   ABDOMINAL HYSTERECTOMY     APPENDECTOMY     BACK SURGERY     CARDIAC CATHETERIZATION N/A 01/01/2016   Procedure: Left Heart Cath and Coronary Angiography;  Surgeon: Candyce GORMAN Reek, MD;  Location: [facility] INVASIVE CV LAB;  Service: Cardiovascular;  Laterality: N/A;   CATARACT EXTRACTION W/PHACO Left 01/30/2015   Procedure: CATARACT EXTRACTION PHACO AND INTRAOCULAR LENS PLACEMENT (IOC);  Surgeon: Elsie Carmine, MD;  Location: [facility] ORS;  Service: Ophthalmology;  Laterality: Left;  US  00:47    CATARACT EXTRACTION W/PHACO Right 02/13/2015   Procedure: CATARACT EXTRACTION PHACO AND INTRAOCULAR LENS PLACEMENT (IOC);  Surgeon: Elsie Carmine, MD;  Location: [facility] ORS;  Service: Ophthalmology;  Laterality: Right;  cassette lot # [id number] H US   00:29.9 AP  20.7 CDE  6.20   COLONOSCOPY N/A 11/02/2014   Procedure: COLONOSCOPY;  Surgeon: Lamar JONETTA Aho, MD;  Location: [facility] ENDOSCOPY;  Service: Endoscopy;  Laterality: N/A;   CORONARY ANGIOPLASTY WITH STENT PLACEMENT Left 02/06/2011   Procedure: CORONARY ANGIOPLASTY WITH STENT PLACEMENT; Location: [facility]; Surgeon: Margie Lovelace, MD   CORONARY ANGIOPLASTY WITH STENT PLACEMENT Left 09/17/2011   Procedure: CORONARY ANGIOPLASTY WITH STENT PLACEMENT; Location: [facility]; Surgeon: Margie Lovelace, MD   CORONARY ANGIOPLASTY WITH STENT PLACEMENT Left 10/02/2011   Procedure: CORONARY ANGIOPLASTY WITH STENT PLACEMENT; Location: [facility]; Surgeon: Margie Lovelace, MD   CORONARY ANGIOPLASTY WITH STENT PLACEMENT Left 08/19/2012   Procedure: CORONARY ANGIOPLASTY WITH STENT PLACEMENT; Location: [facility]; Surgeon:  Deatrice Darron, MD   CORONARY IMAGING/OCT N/A 06/30/2024   Procedure: CORONARY IMAGING/OCT;  Surgeon: Darron Deatrice LABOR, MD;  Location: [facility]VASIVE CV LAB;  Service: Cardiovascular;  Laterality: N/A;   CORONARY LITHOTRIPSY N/A 06/30/2024   Procedure: CORONARY LITHOTRIPSY;  Surgeon: Darron Deatrice LABOR, MD;  Location: [facility] INVASIVE CV LAB;  Service: Cardiovascular;  Laterality: N/A;   CORONARY STENT INTERVENTION N/A 06/30/2024   Procedure: CORONARY STENT INTERVENTION;  Surgeon: Darron Deatrice LABOR, MD;  Location: [facility] INVASIVE CV LAB;  Service: Cardiovascular;  Laterality: N/A;   ESOPHAGOGASTRODUODENOSCOPY (EGD) WITH PROPOFOL  N/A 09/21/2018   Procedure: ESOPHAGOGASTRODUODENOSCOPY (EGD) WITH PROPOFOL ;  Surgeon: Therisa Bi, MD;  Location: [facility] ENDOSCOPY;  Service: Gastroenterology;  Laterality: N/A;   ESOPHAGOGASTRODUODENOSCOPY (EGD) WITH PROPOFOL  N/A 10/06/2022   Procedure: ESOPHAGOGASTRODUODENOSCOPY (EGD) WITH PROPOFOL ;  Surgeon: Therisa Bi, MD;  Location: [facility] ENDOSCOPY;  Service: Gastroenterology;  Laterality: N/A;   EYE SURGERY     LEFT HEART CATH AND CORONARY ANGIOGRAPHY Left 08/02/2014   Procedure: LEFT HEART CATH AND CORONARY ANGIOGRAPHY; Location: [facility]; Surgeon: Alm Clay, MD   PERIPHERAL VASCULAR ATHERECTOMY Left 09/12/2020   Procedure: PERIPHERAL VASCULAR ATHERECTOMY;  Surgeon: Darron Deatrice LABOR, MD;  Location: [facility] INVASIVE CV LAB;  Service: Cardiovascular;  Laterality: Left;   PERIPHERAL VASCULAR BALLOON ANGIOPLASTY  09/12/2020   Procedure: PERIPHERAL VASCULAR BALLOON ANGIOPLASTY;  Surgeon: Darron Deatrice LABOR, MD;  Location: [facility]VASIVE CV LAB;  Service: Cardiovascular;;   REVERSE SHOULDER ARTHROPLASTY Left 10/01/2021   Procedure: REVERSE SHOULDER ARTHROPLASTY WITH BICEPS TENODESIS.;  Surgeon: Edie Norleen PARAS, MD;  Location: [facility] ORS;  Service: Orthopedics;  Laterality: Left;   RIGHT/LEFT HEART CATH AND CORONARY ANGIOGRAPHY Bilateral 05/16/2024   Procedure: RIGHT/LEFT HEART CATH AND  CORONARY ANGIOGRAPHY;  Surgeon: Darron Deatrice LABOR, MD;  Location: [facility] INVASIVE CV LAB;  Service: Cardiovascular;  Laterality: Bilateral;   SHOULDER SURGERY Left 2017   THORACIC LAMINECTOMY FOR SPINAL CORD STIMULATOR N/A 10/27/2023   Procedure: THORACIC LAMINECTOMY FOR SPINAL CORD STIMULATOR PLACEMENT WITH INTERNAL PULSE GENERATOR IMPLANT;  Surgeon: Claudene Penne ORN, MD;  Location: [facility] ORS;  Service: Neurosurgery;  Laterality: N/A;  THORACIC LAMINECTOMY FOR SPINAL CORD STIMULATOR PLACEMENT WITH INTERNAL PULSE GENERATOR IMPLANT     reports that she has been smoking cigarettes. She has a 45 pack-year smoking history. She has never used smokeless tobacco. She reports that she does not currently use drugs. She reports that she does not drink alcohol.  Allergies[1]  Family History  Problem Relation Age of Onset   Heart attack Mother        First MI @ 8 - Died @ 71   Heart disease Mother    Heart disease Father        Died @ 1   Throat cancer Brother    Liver cancer Brother    Colon cancer Sister     Prior to Admission medications  Medication Sig Start Date End Date Taking? Authorizing Provider  acetaminophen  (TYLENOL ) 650 MG CR tablet Take 1,300 mg by mouth every 8 (eight) hours as needed for pain.   Yes [provider]  albuterol  (VENTOLIN  HFA) 108 (90 Base) MCG/ACT inhaler Inhale 1-2 puffs into the lungs every 6 (six) hours as needed for wheezing or shortness of breath.   Yes [provider]  amLODipine  (NORVASC ) 10 MG tablet Take 1 tablet (10 mg total) by mouth daily. 04/13/24 04/13/25 Yes Viviann Pastor, MD  aspirin  EC 81 MG tablet Take 1 tablet (81 mg total) by mouth daily. Swallow whole. 05/16/24 05/16/25 Yes Darron Deatrice LABOR, MD  atorvastatin  (LIPITOR ) 80 MG tablet Take 1 tablet (80 mg total) by mouth daily. for cholesterol. 04/28/24  Yes Clark, Katherine K, NP  clobetasol  cream (TEMOVATE ) 0.05 % Apply 1 Application topically 2 (two) times daily as needed. For  vaginal itching. 03/24/24  Yes Clark, Katherine K, NP  clopidogrel  (PLAVIX ) 75 MG tablet Take 1 tablet (75 mg total) by mouth daily. 06/21/24  Yes Darron Deatrice LABOR, MD  ezetimibe  (ZETIA ) 10 MG tablet Take 1 tablet (10 mg total) by mouth daily. For cholesterol. 04/28/24  Yes Gretta Comer POUR, NP  furosemide  (LASIX ) 20 MG tablet Take 1 tablet (20 mg total) by mouth daily as needed (As needed for weight gain greater than 3 pounds overnight or increased shortness of breath.). Take one tab only on Monday, Wednesday, & Fridays Patient taking differently: Take 20  mg by mouth every Monday, Wednesday, and Friday. 04/28/24  Yes Lorene Sinclair L, PA-C  gabapentin  (NEURONTIN ) 300 MG capsule Take 1 capsule (300 mg total) by mouth daily. May take an additional dose throughout the day if needed for pain Patient taking differently: Take 300 mg by mouth daily as needed (Pain). 05/24/24  Yes Clark, Katherine K, NP  Glucagon  (GVOKE HYPOPEN  2-PACK) 1 MG/0.2ML SOAJ Inject 1 mg (1 pen) as needed for severe low blood sugar (sustained glucose less than 55 despite oral glucose treatments). May repeat in 15 minutes as needed. 11/20/22  Yes Clark, Katherine K, NP  insulin  aspart (NOVOLOG  FLEXPEN) 100 UNIT/ML FlexPen INJECT 5 UNITS SUBCUTANEOUSLY THREE TIMES DAILY WITH MEALS PER SLIDING SCALE (FOR DIABETES) 06/24/24  Yes Gretta Comer POUR, NP  isosorbide  mononitrate (IMDUR ) 30 MG 24 hr tablet Take 1 tablet (30 mg total) by mouth daily. 04/29/24  Yes Gollan, Timothy J, MD  levothyroxine  (SYNTHROID ) 100 MCG tablet TAKE 1 TABLET BY MOUTH IN THE MORNING ON  AN  EMPTY  STOMACH  WITH  WATER   ONLY.  NO  FOOD  OR  OTHER  MEDICATIONS  FOR  30  MINUTES 04/21/24  Yes Clark, Katherine K, NP  metoprolol  succinate (TOPROL -XL) 50 MG 24 hr tablet Take 1 tablet (50 mg total) by mouth daily. Take with or immediately following a meal. 07/01/24  Yes Henry Shaver B, NP  mirtazapine  (REMERON ) 15 MG tablet Take 1 tablet (15 mg total) by mouth at bedtime.  For sleep Patient taking differently: Take 15 mg by mouth at bedtime as needed (sleep). 04/28/24  Yes Clark, Katherine K, NP  nitroGLYCERIN  (NITROSTAT ) 0.4 MG SL tablet Place 1 tablet (0.4 mg total) under the tongue every 5 (five) minutes as needed for chest pain. 05/16/24 05/16/25 Yes Darron Deatrice LABOR, MD  ondansetron  (ZOFRAN ) 4 MG tablet Take 1 tablet (4 mg total) by mouth every 6 (six) hours as needed for nausea. 04/28/24  Yes Clark, Katherine K, NP  potassium chloride  (KLOR-CON  M) 10 MEQ tablet Take 10 mEq by mouth every Monday, Wednesday, and Friday.   Yes [provider]  sitaGLIPtin  (JANUVIA ) 100 MG tablet Take 1 tablet (100 mg total) by mouth daily. for diabetes. 09/03/23  Yes Clark, Katherine K, NP  umeclidinium-vilanterol (ANORO ELLIPTA ) 62.5-25 MCG/ACT AEPB Inhale 1 puff into the lungs daily. 05/25/24 05/25/25 Yes Dgayli, Belva, MD  Continuous Glucose Receiver (FREESTYLE LIBRE 2 READER) DEVI Use with sensor to check blood sugars 6 times daily. 03/24/24   Gretta Comer POUR, NP  Continuous Glucose Sensor (FREESTYLE LIBRE 2 SENSOR) MISC USE TO CHECK BLOOD SUGAR CHANGE EVERY 14 DAYS 06/24/24   Clark, Katherine K, NP  Insulin  Pen Needle (INSUPEN PEN NEEDLES) 32G X 4 MM MISC Use to inject insulin  4 times daily. 07/17/20   Clark, Katherine K, NP  nicotine  polacrilex (NICOTINE  MINI) 2 MG lozenge Take 1 lozenge (2 mg total) by mouth every 2 (two) hours as needed for smoking cessation. Patient not taking: Reported on 06/27/2024 05/25/24 08/23/24  Isadora Belva, MD    Physical Exam: Vitals:   07/21/24 0832 07/21/24 0917 07/21/24 1020 07/21/24 1021  BP: (!) 154/89  (!) 138/99   Pulse: 94  81 85  Resp: (!) 31  18 (!) 21  Temp:  (!) 97.4 F (36.3 C)    TempSrc:  Oral    SpO2:      Weight:      Height:        Physical Exam  Constitutional: Alert, awake, calm, comfortable HEENT: Neck supple Respiratory: Bilateral basal rales, scattered wheezing present Cardiovascular: Regular rate and  rhythm, no murmurs / rubs / gallops. No extremity edema. 2+ pedal pulses. No carotid bruits.  Abdomen: Soft, no tenderness, Bowel sounds positive.  Musculoskeletal: no clubbing / cyanosis. Good ROM, no contractures. Normal muscle tone.  Skin: no rashes, lesions, ulcers. Neurologic: CN 2-12 grossly intact. Sensation intact, No focal deficit identified Psychiatric: Alert and oriented x 3. Normal mood.    Labs on Admission: I have personally reviewed following labs and imaging studies  CBC: Recent Labs  Lab 07/21/24 0243  WBC 15.9*  NEUTROABS 8.2*  HGB 13.8  HCT 43.2  MCV 92.1  PLT 404*   Basic Metabolic Panel: Recent Labs  Lab 07/21/24 0243  NA 138  K 4.3  CL 102  CO2 20*  GLUCOSE 296*  BUN 19  CREATININE 1.67*  CALCIUM  9.7   GFR: Estimated Creatinine Clearance: 24.6 mL/min (A) (by C-G formula based on SCr of 1.67 mg/dL (H)). Liver Function Tests: Recent Labs  Lab 07/21/24 0243  AST 24  ALT 14  ALKPHOS 128*  BILITOT 0.3  PROT 7.6  ALBUMIN 3.9   No results for input(s): LIPASE, AMYLASE in the last 168 hours. No results for input(s): AMMONIA in the last 168 hours. Coagulation Profile: No results for input(s): INR, PROTIME in the last 168 hours. Cardiac Enzymes: No results for input(s): CKTOTAL, CKMB, CKMBINDEX, TROPONINI, TROPONINIHS in the last 168 hours. BNP (last 3 results) Recent Labs    04/26/24 1923  BNP 262.5*   HbA1C: No results for input(s): HGBA1C in the last 72 hours. CBG: Recent Labs  Lab 07/21/24 0752  GLUCAP 421*   Lipid Profile: No results for input(s): CHOL, HDL, LDLCALC, TRIG, CHOLHDL, LDLDIRECT in the last 72 hours. Thyroid  Function Tests: No results for input(s): TSH, T4TOTAL, FREET4, T3FREE, THYROIDAB in the last 72 hours. Anemia Panel: No results for input(s): VITAMINB12, FOLATE, FERRITIN, TIBC, IRON, RETICCTPCT in the last 72 hours. Urine analysis:    Component Value  Date/Time   COLORURINE YELLOW (A) 09/11/2022 1428   APPEARANCEUR CLEAR (A) 09/11/2022 1428   APPEARANCEUR Clear 06/12/2014 1900   LABSPEC 1.017 09/11/2022 1428   LABSPEC 1.010 06/12/2014 1900   PHURINE 5.0 09/11/2022 1428   GLUCOSEU 150 (A) 09/11/2022 1428   GLUCOSEU Negative 06/12/2014 1900   HGBUR NEGATIVE 09/11/2022 1428   BILIRUBINUR Negative 10/28/2022 1059   BILIRUBINUR Negative 06/12/2014 1900   KETONESUR NEGATIVE 09/11/2022 1428   PROTEINUR Negative 10/28/2022 1059   PROTEINUR 30 (A) 09/11/2022 1428   UROBILINOGEN 0.2 10/28/2022 1059   NITRITE Negative 10/28/2022 1059   NITRITE NEGATIVE 09/11/2022 1428   LEUKOCYTESUR Negative 10/28/2022 1059   LEUKOCYTESUR NEGATIVE 09/11/2022 1428   LEUKOCYTESUR 1+ 06/12/2014 1900    Radiological Exams on Admission: I have personally reviewed images DG Chest Portable 1 View Result Date: 07/21/2024 EXAM: 1 VIEW(S) XRAY OF THE CHEST 07/21/2024 02:51:00 AM COMPARISON: 04/26/2024 CLINICAL HISTORY: acute dyspnea with hypoxia, suspect CHF FINDINGS: LINES, TUBES AND DEVICES: Stable thoracic spinal stimulator device. LUNGS AND PLEURA: Diffuse bilateral interstitial infiltrate with superimposed extensive airspace infiltrate within the perihilar and infrahilar right lung is in keeping with moderate-to-severe, asymmetric pulmonary edema. No pleural effusion. No pneumothorax. HEART AND MEDIASTINUM: Mild cardiomegaly. Calcified aortic atherosclerosis. BONES AND SOFT TISSUES: Left shoulder arthroplasty. No acute osseous abnormality. IMPRESSION: 1. Moderate-to-severe asymmetric pulmonary edema, more severe within the right lung. 2. Mild cardiomegaly. 3. Stable thoracic spinal stimulator  device. 4. Left shoulder arthroplasty. Electronically signed by: Dorethia Molt MD 07/21/2024 03:27 AM EST RP Workstation: [id number]    EKG: My personal interpretation of EKG shows: Sinus rhythm with a left bundle branch block    Assessment/Plan Principal Problem:   Acute  hypoxemic respiratory failure (HCC) Active Problems:   Chronic pain syndrome   Uncontrolled type 2 diabetes mellitus with hypoglycemia, with long-term current use of insulin  (HCC)   COPD (chronic obstructive pulmonary disease) (HCC)   Mixed hyperlipidemia   Hypothyroidism   Chronic systolic congestive heart failure (HCC)   Acute on chronic systolic CHF (congestive heart failure) (HCC)    Assessment and Plan: 79 year old female who has a history of CAD s/p recent PCI, chronic congestive heart failure, COPD on home oxygen 2 L, chronic pain syndrome, HTN, HLD, diabetes who came into ED complaining of sudden onset of shortness of breath, has pulmonary edema.   1.  Acute on chronic hypoxemic respiratory failure in the setting of CHF and COPD - She had a severe hypoxemic respiratory failure requiring BiPAP. - She will be admitted to hospital as inpatient. - Continue BiPAP and try to wean as soon as we can, maintain saturation more than 90% - Patient received IV Lasix  with some improvement. - Continue Lasix , input output charting, daily weight, cardiology evaluation.  2.  Acute on chronic congestive heart failure likely diastolic - Last EF was 55 to 60% - Patient received Lasix  in the emergency room and I will continue Lasix  40 mg twice daily. - Daily weight, input output charting - CHF pathway - Cardiology evaluation as mentioned above  3.  COPD without any exacerbation - She does not have much wheezing. - Will continue her on BiPAP, give her nebulization, monitor oxygen to maintain saturation more than 90%  4.  CAD s/p PCI - Continue aspirin  Plavix , statin atorvastatin , nitrate and Zetia  - Follow-up cardiology recommendations  5.  Hypothyroidism - Resume her home dose of levothyroxine   6.  HTN - Continue metoprolol  50 mg and amlodipine  - Hydralazine  for as needed  7.  Type 2 diabetes - Continue insulin  sliding scale - Adjust insulin  as needed    DVT prophylaxis:  Lovenox  Code Status: Full Code Family Communication: Family at bedside Disposition Plan: Home Consults called: Cardiology Admission status: Inpatient, progressive   Nena Rebel, MD [facility] 07/21/2024, 10:25 AM       [1] No Known Allergies  "

## 2024-07-21 NOTE — ED Notes (Signed)
 MD Paudel notified of pt's elevated BGC of 421 face to face in department. MD Paudel recommends pt get 9 units of insulin .

## 2024-07-22 ENCOUNTER — Other Ambulatory Visit (HOSPITAL_COMMUNITY): Payer: Self-pay

## 2024-07-22 ENCOUNTER — Telehealth (HOSPITAL_COMMUNITY): Payer: Self-pay | Admitting: Pharmacy Technician

## 2024-07-22 DIAGNOSIS — I5021 Acute systolic (congestive) heart failure: Secondary | ICD-10-CM | POA: Diagnosis not present

## 2024-07-22 DIAGNOSIS — N189 Chronic kidney disease, unspecified: Secondary | ICD-10-CM

## 2024-07-22 DIAGNOSIS — I25118 Atherosclerotic heart disease of native coronary artery with other forms of angina pectoris: Secondary | ICD-10-CM | POA: Diagnosis not present

## 2024-07-22 DIAGNOSIS — I10 Essential (primary) hypertension: Secondary | ICD-10-CM | POA: Diagnosis not present

## 2024-07-22 DIAGNOSIS — J9601 Acute respiratory failure with hypoxia: Secondary | ICD-10-CM | POA: Diagnosis not present

## 2024-07-22 DIAGNOSIS — I214 Non-ST elevation (NSTEMI) myocardial infarction: Secondary | ICD-10-CM

## 2024-07-22 LAB — CBC
HCT: 35.5 % — ABNORMAL LOW (ref 36.0–46.0)
Hemoglobin: 11.5 g/dL — ABNORMAL LOW (ref 12.0–15.0)
MCH: 29.3 pg (ref 26.0–34.0)
MCHC: 32.4 g/dL (ref 30.0–36.0)
MCV: 90.6 fL (ref 80.0–100.0)
Platelets: 326 K/uL (ref 150–400)
RBC: 3.92 MIL/uL (ref 3.87–5.11)
RDW: 14.1 % (ref 11.5–15.5)
WBC: 17.3 K/uL — ABNORMAL HIGH (ref 4.0–10.5)
nRBC: 0 % (ref 0.0–0.2)

## 2024-07-22 LAB — COMPREHENSIVE METABOLIC PANEL WITH GFR
ALT: 19 U/L (ref 0–44)
AST: 136 U/L — ABNORMAL HIGH (ref 15–41)
Albumin: 3.4 g/dL — ABNORMAL LOW (ref 3.5–5.0)
Alkaline Phosphatase: 85 U/L (ref 38–126)
Anion gap: 15 (ref 5–15)
BUN: 22 mg/dL (ref 8–23)
CO2: 24 mmol/L (ref 22–32)
Calcium: 9 mg/dL (ref 8.9–10.3)
Chloride: 97 mmol/L — ABNORMAL LOW (ref 98–111)
Creatinine, Ser: 1.47 mg/dL — ABNORMAL HIGH (ref 0.44–1.00)
GFR, Estimated: 37 mL/min — ABNORMAL LOW
Glucose, Bld: 226 mg/dL — ABNORMAL HIGH (ref 70–99)
Potassium: 3.8 mmol/L (ref 3.5–5.1)
Sodium: 136 mmol/L (ref 135–145)
Total Bilirubin: 0.4 mg/dL (ref 0.0–1.2)
Total Protein: 6.2 g/dL — ABNORMAL LOW (ref 6.5–8.1)

## 2024-07-22 LAB — TROPONIN T, HIGH SENSITIVITY: Troponin T High Sensitivity: 3076 ng/L (ref 0–19)

## 2024-07-22 LAB — PROCALCITONIN: Procalcitonin: 4.19 ng/mL

## 2024-07-22 LAB — GLUCOSE, CAPILLARY
Glucose-Capillary: 266 mg/dL — ABNORMAL HIGH (ref 70–99)
Glucose-Capillary: 219 mg/dL — ABNORMAL HIGH (ref 70–99)
Glucose-Capillary: 372 mg/dL — ABNORMAL HIGH (ref 70–99)
Glucose-Capillary: 125 mg/dL — ABNORMAL HIGH (ref 70–99)

## 2024-07-22 LAB — HEPARIN LEVEL (UNFRACTIONATED)
Heparin Unfractionated: 0.27 [IU]/mL — ABNORMAL LOW (ref 0.30–0.70)
Heparin Unfractionated: 0.16 [IU]/mL — ABNORMAL LOW (ref 0.30–0.70)

## 2024-07-22 LAB — MAGNESIUM: Magnesium: 2 mg/dL (ref 1.7–2.4)

## 2024-07-22 MED ORDER — GLUCERNA SHAKE PO LIQD: 237.0000 mL | Freq: Three times a day (TID) | ORAL | Status: DC

## 2024-07-22 MED ORDER — IPRATROPIUM-ALBUTEROL 0.5-2.5 (3) MG/3ML IN SOLN
3.0000 mL | Freq: Four times a day (QID) | RESPIRATORY_TRACT | Status: AC
  Administered 2024-07-22 – 2024-07-23 (×2): 3 mL via RESPIRATORY_TRACT
  Filled 2024-07-22 (×2): qty 3

## 2024-07-22 MED ORDER — SODIUM CHLORIDE 0.9 % IV SOLN
2.0000 g | INTRAVENOUS | Status: DC
  Administered 2024-07-22 – 2024-07-25 (×4): 2 g via INTRAVENOUS
  Filled 2024-07-22 (×5): qty 20

## 2024-07-22 MED ORDER — HEPARIN BOLUS VIA INFUSION
1600.0000 [IU] | Freq: Once | INTRAVENOUS | Status: AC
  Administered 2024-07-22: 1600 [IU] via INTRAVENOUS
  Filled 2024-07-22: qty 1600

## 2024-07-22 MED ORDER — HEPARIN BOLUS VIA INFUSION
1000.0000 [IU] | Freq: Once | INTRAVENOUS | Status: AC
  Administered 2024-07-22: 1000 [IU] via INTRAVENOUS
  Filled 2024-07-22: qty 1000

## 2024-07-22 MED ORDER — BOOST / RESOURCE BREEZE PO LIQD CUSTOM
1.0000 | Freq: Three times a day (TID) | ORAL | Status: DC
  Administered 2024-07-22: 1 via ORAL

## 2024-07-22 MED ORDER — FUROSEMIDE 10 MG/ML IJ SOLN
60.0000 mg | Freq: Two times a day (BID) | INTRAMUSCULAR | Status: DC
  Administered 2024-07-22 – 2024-07-24 (×5): 60 mg via INTRAVENOUS
  Filled 2024-07-22 (×5): qty 6

## 2024-07-22 MED ORDER — SODIUM CHLORIDE 0.9 % IV SOLN
500.0000 mg | INTRAVENOUS | Status: DC
  Administered 2024-07-22 – 2024-07-23 (×2): 500 mg via INTRAVENOUS
  Filled 2024-07-22 (×3): qty 5

## 2024-07-22 MED ORDER — IPRATROPIUM-ALBUTEROL 0.5-2.5 (3) MG/3ML IN SOLN
3.0000 mL | RESPIRATORY_TRACT | Status: DC
  Administered 2024-07-22: 3 mL via RESPIRATORY_TRACT

## 2024-07-22 NOTE — Inpatient Diabetes Management (Signed)
 Inpatient Diabetes Program Recommendations  AACE/ADA: New Consensus Statement on Inpatient Glycemic Control (2015)  Target Ranges:  Prepandial:   less than 140 mg/dL      Peak postprandial:   less than 180 mg/dL (1-2 hours)      Critically ill patients:  140 - 180 mg/dL   Lab Results  Component Value Date   GLUCAP 266 (H) 07/22/2024   HGBA1C 8.0 (A) 06/24/2024    Review of Glycemic Control  Diabetes history: DM 2 Outpatient Diabetes medications: Novolog  5 units tid meal coverage, Januvia  100 mg Daily Current orders for Inpatient glycemic control: Novolog  0-9 units tid + hs  A1c 8% on 12/5 Solumedrol 125 mg x1 dose given on 1/1 0250 am.  Inpatient Diabetes Program Recommendations:    -   May consider adding Novolog  2 units tid meal coverage if eating >50% of meals ( on meal coverage at home)  Thanks,  Clotilda Bull RN, MSN, BC-ADM Inpatient Diabetes Coordinator Team Pager [phone] (8a-5p)

## 2024-07-22 NOTE — Progress Notes (Signed)
 When this patient came to the floor, she was on heparin  drip at 6.5 ml/hr,nitroglycerin  drip at 20 ml/hr and was on 6L [place]. VSS, no pain or discomfort, will continue to monitor.

## 2024-07-22 NOTE — Plan of Care (Signed)
   Problem: Education: Goal: Ability to describe self-care measures that may prevent or decrease complications (Diabetes Survival Skills Education) will improve Outcome: Progressing Goal: Individualized Educational Video(s) Outcome: Progressing   Problem: Coping: Goal: Ability to adjust to condition or change in health will improve Outcome: Progressing

## 2024-07-22 NOTE — Progress Notes (Signed)
 "  Rounding Note   Patient Name: Sarah Payne Date of Encounter: 07/22/2024  [facility] Cardiologist: Evalene Lunger, MD   Subjective Patient seen on a.m. rounds.  States that breathing has improved.  Continues to have some chest discomfort.  She is able to lie flat in the bed.  She has -700 mL output since arriving to the floor from the emergency department.  She has several questions today related to labs and potential right and left heart catheterization.  Scheduled Meds:  amLODipine   10 mg Oral Daily   aspirin  EC  81 mg Oral Daily   atorvastatin   80 mg Oral Daily   clopidogrel   75 mg Oral Daily   ezetimibe   10 mg Oral Daily   feeding supplement  1 Container Oral TID BM   furosemide   40 mg Intravenous BID   heparin   1,000 Units Intravenous Once   insulin  aspart  0-5 Units Subcutaneous QHS   insulin  aspart  0-9 Units Subcutaneous TID WC   isosorbide  mononitrate  30 mg Oral Daily   levothyroxine   100 mcg Oral Q0600   metoprolol  succinate  50 mg Oral Daily   umeclidinium-vilanterol  1 puff Inhalation Daily   Continuous Infusions:  heparin  650 Units/hr (07/21/24 1444)   nitroGLYCERIN  16.667 mcg/min (07/22/24 0157)   PRN Meds: acetaminophen  **OR** acetaminophen , hydrALAZINE , ipratropium-albuterol , mirtazapine , morphine  injection, nitroGLYCERIN , ondansetron  **OR** ondansetron  (ZOFRAN ) IV, oxyCODONE    Vital Signs  Vitals:   07/22/24 0158 07/22/24 0404 07/22/24 0500 07/22/24 0608  BP: 120/88 126/83  121/70  Pulse: 69 86  80  Resp: 18 20  18   Temp:  98.2 F (36.8 C)  99.7 F (37.6 C)  TempSrc:  Oral    SpO2: 95% 93%    Weight:   60.2 kg   Height:        Intake/Output Summary (Last 24 hours) at 07/22/2024 0748 Last data filed at 07/21/2024 1222 Gross per 24 hour  Intake --  Output 700 ml  Net -700 ml      07/22/2024    5:00 AM 07/21/2024    3:11 AM 07/21/2024    2:28 AM  Last 3 Weights  Weight (lbs) 132 lb 11.5 oz 119 lb 11.4 oz 119 lb 11.4 oz  Weight (kg) 60.2  kg 54.3 kg 54.3 kg      Telemetry Sinus rhythm with a left bundle branch block and unifocal PVCs- Personally Reviewed  ECG  No new tracings- Personally Reviewed  Physical Exam  GEN: No acute distress.   Neck: + JVD Cardiac: RRR, no murmurs, rubs, or gallops.  Respiratory: Rhonchorous throughout from the bases to mid-lung to auscultation bilaterally.  Respirations are unlabored at rest on 6 L of O2 via nasal cannula (patient is not on oxygen at home) GI: Soft, nontender, non-distended  MS: No edema; No deformity. Neuro:  Nonfocal  Psych: Normal affect   Labs High Sensitivity Troponin:  No results for input(s): TROPONINIHS in the last 720 hours.  Recent Labs  Lab 07/21/24 0243 07/21/24 0441 07/21/24 1259 07/21/24 1519  TRNPT 162* 134* 662* 795*       Chemistry Recent Labs  Lab 07/21/24 0243 07/22/24 0348  NA 138 136  K 4.3 3.8  CL 102 97*  CO2 20* 24  GLUCOSE 296* 226*  BUN 19 22  CREATININE 1.67* 1.47*  CALCIUM  9.7 9.0  MG  --  2.0  PROT 7.6 6.2*  ALBUMIN 3.9 3.4*  AST 24 136*  ALT 14 19  ALKPHOS 128* 85  BILITOT 0.3 0.4  GFRNONAA 31* 37*  ANIONGAP 16* 15    Lipids No results for input(s): CHOL, TRIG, HDL, LABVLDL, LDLCALC, CHOLHDL in the last 168 hours.  Hematology Recent Labs  Lab 07/21/24 0243 07/22/24 0348  WBC 15.9* 17.3*  RBC 4.69 3.92  HGB 13.8 11.5*  HCT 43.2 35.5*  MCV 92.1 90.6  MCH 29.4 29.3  MCHC 31.9 32.4  RDW 14.3 14.1  PLT 404* 326   Thyroid  No results for input(s): TSH, FREET4 in the last 168 hours.  BNP Recent Labs  Lab 07/21/24 0243  PROBNP 8,644.0*    DDimer No results for input(s): DDIMER in the last 168 hours.   Radiology    Cardiac Studies 2d echo 07/22/2023 1. Left ventricular ejection fraction, by estimation, is 25 to 30%. The  left ventricle has severely decreased function. The left ventricle  demonstrates global hypokinesis. Left ventricular diastolic parameters are  consistent with Grade  I diastolic  dysfunction (impaired relaxation). The average left ventricular global  longitudinal strain is -7.5 %. The global longitudinal strain is abnormal.   2. Right ventricular systolic function is normal. The right ventricular  size is normal.   3. The mitral valve is normal in structure. Mild to moderate mitral valve  regurgitation. No evidence of mitral stenosis.   4. The aortic valve is normal in structure. Aortic valve regurgitation is  not visualized. Aortic valve sclerosis is present, with no evidence of  aortic valve stenosis.   5. The inferior vena cava is normal in size with greater than 50%  respiratory variability, suggesting right atrial pressure of 3 mmHg.   Patient Profile   79 y.o. female with a past medical history significant for coronary artery disease with prior stent x 3 placed to the mid left circumflex and moderate to severe LAD disease with a last heart catheterization 06/21/2024 with stent placement to the proximal LAD, depression/anxiety, hypertension, smoking history, COPD, poorly controlled diabetes with hemoglobin A1c of 10, who is being seen and evaluated for progressive worsening shortness of breath and chest discomfort.  Assessment & Plan  Acute on chronic respiratory failure with hypoxia -Long smoking history  -Recent viral syndrome concerning for flu -Currently maintaining oxygen saturations on 6 L of O2 via nasal cannula, of note patient has not required oxygen at home -Titrate FiO2 to maintain oxygen saturations of greater than equal to 92% -Previously required BiPAP in the emergency department -Respiratory panel negative for flu, RSV, COVID -Chest x-ray revealing moderate to severe pulmonary edema more severe within the right lung, mild cardiomegaly, stable thoracic spinal stimulator device, and left shoulder arthroplasty -Supportive care  Acute HFrEF -Patient presented with progressive worsening shortness of breath to the emergency  department -Chest x-ray consistent with pulmonary edema -ProBNP 8644 -Continued on furosemide  40 mg IV twice daily -Continued on Toprol -XL -Echocardiogram revealed significant drop in LVEF of 25-30% -Previous echo revealed LVEF 55-60% --700 mL output since arrival to the floor from the emergency department -Heart failure education -Daily weights and I's and O's -Escalate GDMT as tolerated by kidney function -Will need further ischemic evaluation with significant drop in LVEF, tentatively scheduled for right and left heart catheterization today but continues to appear to be volume up would likely benefit from procedure Monday and to continue to be diuresed over the weekend  Coronary artery disease with stable angina -Recent stent placement to the LAD earlier in December 2025 - Nitro drip has been weaned off -Reports continued mild  chest discomfort -High-sensitivity troponins peaked at 795, additional one ordered to assess trend - Continued on heparin  infusion -Continued on aspirin  81 mg daily, atorvastatin  80 mg daily, clopidogrel  75 mg daily, ezetimibe  10 mg daily, Imdur  30 mg daily, -Will need further ischemic evaluation with drop in LVEF once breathing has improved -Continue on telemetry monitoring -EKG as needed for pain or changes  Hypertension -Presented with hypertensive urgency -Nitroglycerin  has been weaned off -BP 117/67 -Continued on current medication regimen -Vital signs per unit protocol  Hyperlipidemia -Last LDL 44 -Continue on atorvastatin  80, ezetimibe  10 mg daily, PTA PCSK9 inhibitor on hold for hospitalization  Chronic kidney disease stage 3B -Serum creatinine 1.47 -Baseline serum creatinine around 1.3 -Slight improvement from yesterday 1.67 - Daily BMP while on IV diuretic therapy monitoring kidney function closely -Monitor urine output -Monitor/trend/replete electrolytes -Avoid nephrotoxic agents were able  Longstanding history of COPD not currently in  exacerbation and a former smoker -Continued on inhalers -Has DuoNebs  Peripheral arterial disease/carotid artery stenosis -Patient has previously had intervention to the left SFA most recent ABI in 09/2022 was stable -Carotid ultrasound completed 3/24 showed 1-39% bilateral ICA stenosis -Continued on DAPT and statin therapy  Type 2 diabetes -Continued on sliding scale insulin  -Poorly controlled with A1c of 10 -Ongoing management per IM  Intermittent left bundle branch block -Chronic finding on EKGs - Noted on telemetry monitoring - Continue with telemetry monitoring     For questions or updates, please contact [facility] Please consult [email or web address] for contact info under       Signed, Kaspar Albornoz, NP  07/22/2024, 7:48 AM    "

## 2024-07-22 NOTE — Progress Notes (Signed)
 PHARMACY - ANTICOAGULATION CONSULT NOTE  Pharmacy Consult for [facility] Indication: chest pain/ACS  Allergies[1]  Patient Measurements: Height: 5' 6 (167.6 cm) Weight: 54.3 kg (119 lb 11.4 oz) IBW/kg (Calculated) : 59.3 HEPARIN  DW (KG): 54.3  Vital Signs: Temp: 98.4 F (36.9 C) (01/01 2210) Temp Source: Oral (01/01 1902) BP: 130/79 (01/01 2210) Pulse Rate: 73 (01/01 2210)  Labs: Recent Labs    07/21/24 0243 07/21/24 1417 07/21/24 2249  HGB 13.8  --   --   HCT 43.2  --   --   PLT 404*  --   --   APTT  --  36  --   LABPROT  --  13.5  --   INR  --  1.0  --   HEPARINUNFRC  --   --  0.34  CREATININE 1.67*  --   --     Estimated Creatinine Clearance: 24.6 mL/min (A) (by C-G formula based on SCr of 1.67 mg/dL (H)).   Medical History: Past Medical History:  Diagnosis Date   Acute pain of left shoulder 01/17/2019   Aortic atherosclerosis    CHF (congestive heart failure) (HCC) 09/16/2013   a.) TTE 09/16/2013: EF 55-60%; mod inferior HK, triv TR; G1DD. b.) TTE 01/02/2016: EF 25-30%; mid-apicalateroseptal, lat, inf, inferoseptal, apical akinesis; triv TR; G1DD. c. TTE 04/16/2016: EF 65%; G1DD. d.) TTE 05/02/2020: EF 50-55%; G2DD. e.) TTE 04/11/2021: EF 55%; G1DD.   Chronic low back pain with bilateral sciatica    Chronic painful diabetic neuropathy (HCC)    Chronic radicular lumbar pain    CKD (chronic kidney disease), stage III (HCC)    Clostridioides difficile infection 2015   COPD (chronic obstructive pulmonary disease) (HCC)    Coronary artery disease    a.) PCI 02/06/2011: 100% mLCx - DES x 3. b.) PCI 09/17/2011: 95% dRCA - DES x 1. c.) PCI 10/02/2011: 75% pLCX - DES x 1. d.) PCI 08/19/2012: DES x 2 p-mRCA.  e.) LHC 01/01/16: Takotsubo event 01/01/2016 with patent stents   Depression    Diverticulosis    Failed back syndrome    Frequent PVCs    a. Noted in hospital 12/2015.   GERD (gastroesophageal reflux disease)    History of bilateral cataract extraction 01/2015    History of heart artery stent    a.) TOTAL of 7 stents --> 02/06/2011 - overlapping 2.5 x 12 mm Xience V, 2.5 x 8 mm Xience V, 2.25 x 8 mm Xience Nano to mLCx; 09/17/2011 - 2.5 x 23 Xience V dRCA; 10/02/2011 - 2.5 x 15 mm Xience V pLCx; 08/09/2012 - overlapping 3.0 x 38mm mRCA and 3.5 x 23mm pRCA   Hyperlipidemia    Hypertension    Hypothyroidism    Impingement syndrome of left shoulder    LBBB (left bundle branch block)    Long term current use of clopidogrel     Marijuana abuse    Myocardial infarction [facility])    a.) multiple MIs;  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 12/2015 - nephew committed suicide 1 week prior, sister died the morning of presentation - initially called a STEMI; cath with patent stents. LVEF 25-30%.   Tendonitis of left rotator cuff    Tobacco abuse    Vaginal burning 03/22/2019   Vascular dementia     Medications:  Medications Prior to Admission  Medication Sig Dispense Refill Last Dose/Taking   acetaminophen  (TYLENOL ) 650 MG CR tablet Take 1,300 mg by mouth every 8 (eight) hours as needed for pain.   Unknown   albuterol  (VENTOLIN  HFA) 108 (90 Base) MCG/ACT inhaler Inhale 1-2 puffs into the lungs every 6 (six) hours as needed for wheezing or shortness of breath.   Unknown   amLODipine  (NORVASC ) 10 MG tablet Take 1 tablet (10 mg total) by mouth daily. 90 tablet 0 07/20/2024 Morning   aspirin  EC 81 MG tablet Take 1 tablet (81 mg total) by mouth daily. Swallow whole.   07/20/2024 Morning   atorvastatin  (LIPITOR ) 80 MG tablet Take 1 tablet (80 mg  total) by mouth daily. for cholesterol. 90 tablet 2 07/20/2024 Morning   clobetasol  cream (TEMOVATE ) 0.05 % Apply 1 Application topically 2 (two) times daily as needed. For vaginal itching. 30 g 0 Unknown   clopidogrel  (PLAVIX ) 75 MG tablet Take 1 tablet (75 mg total) by mouth daily. 90 tablet 3 07/20/2024 Morning   ezetimibe  (ZETIA ) 10 MG tablet Take 1 tablet (10 mg total) by mouth daily. For cholesterol. 90 tablet 2 07/20/2024 Morning   furosemide  (LASIX ) 20 MG tablet Take 1 tablet (20 mg total) by mouth daily as needed (As needed for weight gain greater than 3 pounds overnight or increased shortness of breath.). Take one tab only on Monday, Wednesday, & Fridays (Patient taking differently: Take 20 mg by mouth every Monday, Wednesday, and Friday.) 36 tablet 3 07/20/2024 Morning   gabapentin  (NEURONTIN ) 300 MG capsule Take 1 capsule (300 mg total) by mouth daily. May take an additional dose throughout the day if needed for pain (Patient taking differently: Take 300 mg by mouth daily as needed (Pain).) 180 capsule 2 Unknown   Glucagon  (GVOKE HYPOPEN  2-PACK) 1 MG/0.2ML SOAJ Inject 1 mg (1 pen) as needed for severe low blood sugar (sustained glucose less than 55 despite oral glucose treatments). May repeat in 15 minutes as needed. 0.4 mL 1 Unknown   insulin  aspart (NOVOLOG  FLEXPEN) 100 UNIT/ML FlexPen INJECT 5 UNITS SUBCUTANEOUSLY THREE TIMES DAILY WITH MEALS PER SLIDING SCALE (FOR DIABETES) 45 mL 1 07/20/2024 Evening   isosorbide  mononitrate (IMDUR ) 30 MG 24 hr tablet Take 1 tablet (30 mg total) by mouth daily. 90 tablet 3 07/20/2024 Morning   levothyroxine  (SYNTHROID ) 100 MCG tablet TAKE 1 TABLET BY MOUTH IN THE MORNING ON  AN  EMPTY  STOMACH  WITH  WATER   ONLY.  NO  FOOD  OR  OTHER  MEDICATIONS  FOR  30  MINUTES 90 tablet 2 07/20/2024 Morning   metoprolol  succinate (TOPROL -XL) 50 MG 24 hr tablet Take 1 tablet (50 mg total) by mouth daily. Take with or immediately following a meal. 30 tablet 3 07/20/2024  Morning   mirtazapine  (REMERON ) 15 MG tablet Take 1 tablet (15 mg total) by mouth at bedtime. For sleep (Patient taking differently: Take 15 mg by mouth at bedtime as needed (sleep).) 90 tablet 2 Unknown   nitroGLYCERIN  (NITROSTAT ) 0.4 MG SL tablet Place 1 tablet (0.4 mg total) under the tongue every 5 (five) minutes as needed for chest pain.  25 tablet 3 Unknown   ondansetron  (ZOFRAN ) 4 MG tablet Take 1 tablet (4 mg total) by mouth every 6 (six) hours as needed for nausea. 15 tablet 0 Unknown   potassium chloride  (KLOR-CON  M) 10 MEQ tablet Take 10 mEq by mouth every Monday, Wednesday, and Friday.   07/20/2024 Morning   sitaGLIPtin  (JANUVIA ) 100 MG tablet Take 1 tablet (100 mg total) by mouth daily. for diabetes. 90 tablet 1 07/20/2024 Morning   umeclidinium-vilanterol (ANORO ELLIPTA ) 62.5-25 MCG/ACT AEPB Inhale 1 puff into the lungs daily. 30 each 11 07/20/2024 Morning   Continuous Glucose Receiver (FREESTYLE LIBRE 2 READER) DEVI Use with sensor to check blood sugars 6 times daily. 1 each 0    Continuous Glucose Sensor (FREESTYLE LIBRE 2 SENSOR) MISC USE TO CHECK BLOOD SUGAR CHANGE EVERY 14 DAYS 6 each 3    Insulin  Pen Needle (INSUPEN PEN NEEDLES) 32G X 4 MM MISC Use to inject insulin  4 times daily. 250 each 1    nicotine  polacrilex (NICOTINE  MINI) 2 MG lozenge Take 1 lozenge (2 mg total) by mouth every 2 (two) hours as needed for smoking cessation. (Patient not taking: Reported on 06/27/2024) 72 lozenge 3 Not Taking    Assessment: 79 y/o F with a known h/o CAD and recent stent to LAD this month admitted with CP. Patient received Lovenox  this morning.   Goal of Therapy:  Heparin  level 0.3-0.7 units/ml Monitor platelets by anticoagulation protocol: Yes   Plan:  1/02:  HL @ 2249 = 0.34, therapeutic X 1 - Will continue pt on current rate and recheck HL in 8 hrs. - Daily HL/CBC  Mackenna Kamer D 07/22/2024,12:04 AM       [1] No Known Allergies

## 2024-07-22 NOTE — TOC CM/SW Note (Deleted)
 Transition of Care [facility]) - Inpatient Brief Assessment   Patient Details  Name: Sarah Payne MRN: [id number] Date of Birth: December 23, 1945  Transition of Care [facility]) CM/SW Contact:    Grayce JAYSON Perfect, RN Phone Number: 07/22/2024, 11:41 AM   Clinical Narrative: Transition of Care Department [facility]) has reviewed patient and no TOC needs have been identified at this time.  If new patient transition needs arise, please place a TOC consult.  Patient plans to discharge home with family, has oxygen set up in home, but she cannot remember the name of company.  Patient has a PCP.  She drives and obtains her medications from pharmacy, no issues with paying for medications at this time.   She does not receive any homecare services at this time other than oxygen set up.   She has needed DME in the home at this time.  Transition of Care Asessment:

## 2024-07-22 NOTE — Progress Notes (Signed)
 " Progress Note   Patient: Sarah Payne FMW:[id number] DOB: 1945/08/22 DOA: 07/21/2024     1 DOS: the patient was seen and examined on 07/22/2024   Brief hospital course: The patient is a 79 yr old woman who presented to [facility] ED on 07/21/2024 with complaints of shortness of breath that has worsened over the previous 2 days. Upon presentation to the ED the patient who ordinarily requires 2L O2 was requiring 15L NRB to bring her saturations up to 85%.   The patient carries a past medical history significant for CAD (s/p recent PCI), COPD with chronic hypoxic respiratory failure (2L O2 chronically), CHF, DM II , GERD, HTN, Hyperlipidemia, and hypothyroidism.  In the ED she is found to be hypoxemic with only 85% on non-rebreather 15L, BP 2-4/143, tachycardia at 120, tachypnea at 35, WBC of 15.9, BNP of 8644, Creatinine of 1.67, AG of 16, Troponin of 162. COVID, RSV, and Flu A & B were negative. CXR demonstrated flash pulmonary edema. EKG demonstrated AF/Flutter with a rate of 120 with a left bundle branch block. Repeat EKG demonstrated SNF with left bundle.   The patient was admitted to a progressive bed with lasix  for diuresis. She was on BIPAP  with oxygen. Cardiology was consulted. She has been placed on a nitroglycerin  drip as well as a heparin  infusion. Echocardiogram has demonstrated a decrease in the patient's EF from a previous 55% in mid December to 25%. She will likely require a LHC once stabilized.  She has been placed on inhalers, nebulizers for her COPD exacerbation. CXR has demonstrated moderate to severe asymmetric pulmonary edema right greater than left. This may also represent an infiltrate/consolidate. The patient has been started on Rocephin  and azithromycin .  The patient is resting comfortably. No new complaints.   Assessment and Plan:  Principal Problem:   Acute hypoxemic respiratory failure (HCC) Active Problems:   Chronic pain syndrome   Uncontrolled type 2 diabetes mellitus with  hypoglycemia, with long-term current use of insulin  (HCC)   COPD (chronic obstructive pulmonary disease) (HCC)   Mixed hyperlipidemia   Hypothyroidism   Chronic systolic congestive heart failure (HCC)   Acute on chronic systolic CHF (congestive heart failure) (HCC)     Assessment and Plan: 79 year old female who has a history of CAD s/p recent PCI, chronic congestive heart failure, COPD on home oxygen 2 L, chronic pain syndrome, HTN, HLD, diabetes who came into ED complaining of sudden onset of shortness of breath, has pulmonary edema.     1.  Acute on chronic hypoxemic respiratory failure in the setting of CHF and COPD - She had a severe hypoxemic respiratory failure requiring BiPAP. - She will be admitted to hospital as inpatient. - Continue BiPAP and try to wean as soon as we can, maintain saturation more than 90% - Patient received IV Lasix  with some improvement. - Continue Lasix , input output charting, daily weight, cardiology evaluation.  2. Possible CAP as a source of the patient's COPD exacerbation. The patient has been started on Rocephin  and Azithromycin . Patient presented with a leukocytosis of 17, possible infiltrate vs pulmonary edema on CXR, and procalcitonin of 4/19. Blood cultures x 2 ordered.   2.  Acute on chronic congestive heart failure likely diastolic - Last EF was 55 to 60% - Patient received Lasix  in the emergency room and I will continue Lasix  40 mg twice daily. - BNP of 8644 - CHF pathway - Cardiology evaluation as mentioned above   3.  COPD without any  exacerbation - She does not have much wheezing. - Will continue her on BiPAP, give her nebulization, monitor oxygen to maintain saturation more than 90%   4.  CAD s/p PCI - Continue aspirin  Plavix , statin atorvastatin , nitrate and Zetia  - Follow-up cardiology recommendations   5.  Hypothyroidism - Resume her home dose of levothyroxine    6.  HTN - Continue metoprolol  50 mg and amlodipine  - Hydralazine   for as needed   7.  Type 2 diabetes - Continue insulin  sliding scale - Adjust insulin  as needed       DVT prophylaxis: Lovenox  Code Status: Full Code Family Communication: Family at bedside Disposition Plan: Home Consults called: Cardiology Admission status: Inpatient, progressive       Subjective: The patient is resting comfortably. No new complaints. One daughter is at bedside, the other is on the phone.  Physical Exam: Vitals:   07/22/24 1026 07/22/24 1122 07/22/24 1254 07/22/24 1651  BP: 117/67  127/70 125/74  Pulse: 70  71 76  Resp: 16  19 17   Temp: 98.8 F (37.1 C)  98.2 F (36.8 C) 98.9 F (37.2 C)  TempSrc:      SpO2: 96% 92% 95% 98%  Weight:      Height:       Exam:  Constitutional:  The patient is awake, alert, and oriented x 3. No acute distress. Respiratory:  No increased work of breathing. Coarse rales throughout No tactile fremitus Cardiovascular:  Regular rate and rhythm No murmurs, ectopy, or gallups. No lateral PMI. No thrills. Abdomen:  Abdomen is soft, non-tender, non-distended No hernias, masses, or organomegaly Normoactive bowel sounds.  Musculoskeletal:  No cyanosis, clubbing, or edema Skin:  No rashes, lesions, ulcers palpation of skin: no induration or nodules Neurologic:  CN 2-12 intact Sensation all 4 extremities intact Psychiatric:  Mental status Mood, affect appropriate Orientation to person, place, time  judgment and insight appear intact  Data Reviewed:  CBC, BMP, CXR, Echocardiogram.  Family Communication: Family at bedside and on phone during visit.  Disposition: Status is: Inpatient Remains inpatient appropriate because: Acutely ill with CAP, COPD exac, and CHF exacerbation. Likely will need left heart catheterization.  Planned Discharge Destination: tbd    Time spent: 46 minutes  Author: Souleymane Saiki, DO 07/22/2024 7:09 PM  For on call review [email or web address].  "

## 2024-07-22 NOTE — Progress Notes (Signed)
 PHARMACY - ANTICOAGULATION CONSULT NOTE  Pharmacy Consult for UFH Indication: chest pain/ACS  Allergies[1]  Patient Measurements: Height: 5' 6 (167.6 cm) Weight: 60.2 kg (132 lb 11.5 oz) IBW/kg (Calculated) : 59.3 HEPARIN  DW (KG): 54.3  Vital Signs: Temp: 98.9 F (37.2 C) (01/02 1651) BP: 125/74 (01/02 1651) Pulse Rate: 76 (01/02 1651)  Labs: Recent Labs    07/21/24 0243 07/21/24 1417 07/21/24 2249 07/22/24 0348 07/22/24 0640 07/22/24 1646  HGB 13.8  --   --  11.5*  --   --   HCT 43.2  --   --  35.5*  --   --   PLT 404*  --   --  326  --   --   APTT  --  36  --   --   --   --   LABPROT  --  13.5  --   --   --   --   INR  --  1.0  --   --   --   --   HEPARINUNFRC  --   --  0.34  --  0.27* 0.16*  CREATININE 1.67*  --   --  1.47*  --   --     Estimated Creatinine Clearance: 30.5 mL/min (A) (by C-G formula based on SCr of 1.47 mg/dL (H)).   Medical History: Past Medical History:  Diagnosis Date   Acute pain of left shoulder 01/17/2019   Aortic atherosclerosis    CHF (congestive heart failure) (HCC) 09/16/2013   a.) TTE 09/16/2013: EF 55-60%; mod inferior HK, triv TR; G1DD. b.) TTE 01/02/2016: EF 25-30%; mid-apicalateroseptal, lat, inf, inferoseptal, apical akinesis; triv TR; G1DD. c. TTE 04/16/2016: EF 65%; G1DD. d.) TTE 05/02/2020: EF 50-55%; G2DD. e.) TTE 04/11/2021: EF 55%; G1DD.   Chronic low back pain with bilateral sciatica    Chronic painful diabetic neuropathy (HCC)    Chronic radicular lumbar pain    CKD (chronic kidney disease), stage III (HCC)    Clostridioides difficile infection 2015   COPD (chronic obstructive pulmonary disease) (HCC)    Coronary artery disease    a.) PCI 02/06/2011: 100% mLCx - DES x 3. b.) PCI 09/17/2011: 95% dRCA - DES x 1. c.) PCI 10/02/2011: 75% pLCX - DES x 1. d.) PCI 08/19/2012: DES x 2 p-mRCA.  e.) LHC 01/01/16: Takotsubo event 01/01/2016 with patent stents   Depression    Diverticulosis    Failed back syndrome    Frequent  PVCs    a. Noted in hospital 12/2015.   GERD (gastroesophageal reflux disease)    History of bilateral cataract extraction 01/2015   History of heart artery stent    a.) TOTAL of 7 stents --> 02/06/2011 - overlapping 2.5 x 12 mm Xience V, 2.5 x 8 mm Xience V, 2.25 x 8 mm Xience Nano to mLCx; 09/17/2011 - 2.5 x 23 Xience V dRCA; 10/02/2011 - 2.5 x 15 mm Xience V pLCx; 08/09/2012 - overlapping 3.0 x 38mm mRCA and 3.5 x 23mm pRCA   Hyperlipidemia    Hypertension    Hypothyroidism    Impingement syndrome of left shoulder    LBBB (left bundle branch block)    Long term current use of clopidogrel     Marijuana abuse    Myocardial infarction [facility])    a.) multiple MIs;  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 12/2015 - nephew committed suicide 1 week prior, sister died the morning of presentation - initially called a STEMI; cath with patent stents. LVEF 25-30%.   Tendonitis of left rotator cuff    Tobacco abuse    Vaginal burning 03/22/2019   Vascular dementia     Medications:  Medications Prior to Admission  Medication Sig Dispense Refill Last Dose/Taking   acetaminophen  (TYLENOL ) 650 MG CR tablet Take 1,300 mg by mouth every 8 (eight) hours as needed for pain.   Unknown   albuterol  (VENTOLIN  HFA) 108 (90 Base) MCG/ACT inhaler Inhale 1-2 puffs into the lungs every 6 (six) hours as needed for wheezing or shortness of breath.   Unknown   amLODipine  (NORVASC ) 10 MG tablet Take 1 tablet (10 mg total) by mouth daily. 90 tablet 0 07/20/2024 Morning   aspirin  EC 81 MG tablet Take  1 tablet (81 mg total) by mouth daily. Swallow whole.   07/20/2024 Morning   atorvastatin  (LIPITOR ) 80 MG tablet Take 1 tablet (80 mg total) by mouth daily. for cholesterol. 90 tablet 2 07/20/2024 Morning   clobetasol  cream (TEMOVATE ) 0.05 % Apply 1 Application topically 2 (two) times daily as needed. For vaginal itching. 30 g 0 Unknown   clopidogrel  (PLAVIX ) 75 MG tablet Take 1 tablet (75 mg total) by mouth daily. 90 tablet 3 07/20/2024 Morning   ezetimibe  (ZETIA ) 10 MG tablet Take 1 tablet (10 mg total) by mouth daily. For cholesterol. 90 tablet 2 07/20/2024 Morning   furosemide  (LASIX ) 20 MG tablet Take 1 tablet (20 mg total) by mouth daily as needed (As needed for weight gain greater than 3 pounds overnight or increased shortness of breath.). Take one tab only on Monday, Wednesday, & Fridays (Patient taking differently: Take 20 mg by mouth every Monday, Wednesday, and Friday.) 36 tablet 3 07/20/2024 Morning   gabapentin  (NEURONTIN ) 300 MG capsule Take 1 capsule (300 mg total) by mouth daily. May take an additional dose throughout the day if needed for pain (Patient taking differently: Take 300 mg by mouth daily as needed (Pain).) 180 capsule 2 Unknown   Glucagon  (GVOKE HYPOPEN  2-PACK) 1 MG/0.2ML SOAJ Inject 1 mg (1 pen) as needed for severe low blood sugar (sustained glucose less than 55 despite oral glucose treatments). May repeat in 15 minutes as needed. 0.4 mL 1 Unknown   insulin  aspart (NOVOLOG  FLEXPEN) 100 UNIT/ML FlexPen INJECT 5 UNITS SUBCUTANEOUSLY THREE TIMES DAILY WITH MEALS PER SLIDING SCALE (FOR DIABETES) 45 mL 1 07/20/2024 Evening   isosorbide  mononitrate (IMDUR ) 30 MG 24 hr tablet Take 1 tablet (30 mg total) by mouth daily. 90 tablet 3 07/20/2024 Morning   levothyroxine  (SYNTHROID ) 100 MCG tablet TAKE 1 TABLET BY MOUTH IN THE MORNING ON  AN  EMPTY  STOMACH  WITH  WATER   ONLY.  NO  FOOD  OR  OTHER  MEDICATIONS  FOR  30  MINUTES 90 tablet 2 07/20/2024 Morning   metoprolol  succinate  (TOPROL -XL) 50 MG 24 hr tablet Take 1 tablet (50 mg total) by mouth daily. Take with or immediately following a meal. 30 tablet 3 07/20/2024 Morning  mirtazapine  (REMERON ) 15 MG tablet Take 1 tablet (15 mg total) by mouth at bedtime. For sleep (Patient taking differently: Take 15 mg by mouth at bedtime as needed (sleep).) 90 tablet 2 Unknown   nitroGLYCERIN  (NITROSTAT ) 0.4 MG SL tablet Place 1 tablet (0.4 mg total) under the tongue every 5 (five) minutes as needed for chest pain. 25 tablet 3 Unknown   ondansetron  (ZOFRAN ) 4 MG tablet Take 1 tablet (4 mg total) by mouth every 6 (six) hours as needed for nausea. 15 tablet 0 Unknown   potassium chloride  (KLOR-CON  M) 10 MEQ tablet Take 10 mEq by mouth every Monday, Wednesday, and Friday.   07/20/2024 Morning   sitaGLIPtin  (JANUVIA ) 100 MG tablet Take 1 tablet (100 mg total) by mouth daily. for diabetes. 90 tablet 1 07/20/2024 Morning   umeclidinium-vilanterol (ANORO ELLIPTA ) 62.5-25 MCG/ACT AEPB Inhale 1 puff into the lungs daily. 30 each 11 07/20/2024 Morning   Continuous Glucose Receiver (FREESTYLE LIBRE 2 READER) DEVI Use with sensor to check blood sugars 6 times daily. 1 each 0    Continuous Glucose Sensor (FREESTYLE LIBRE 2 SENSOR) MISC USE TO CHECK BLOOD SUGAR CHANGE EVERY 14 DAYS 6 each 3    Insulin  Pen Needle (INSUPEN PEN NEEDLES) 32G X 4 MM MISC Use to inject insulin  4 times daily. 250 each 1    nicotine  polacrilex (NICOTINE  MINI) 2 MG lozenge Take 1 lozenge (2 mg total) by mouth every 2 (two) hours as needed for smoking cessation. (Patient not taking: Reported on 06/27/2024) 72 lozenge 3 Not Taking    Assessment: 79 y/o F with a known h/o CAD and recent stent to LAD this month admitted with CP. Patient received Lovenox  this morning.   1/2@0640 : HL 0.27, subtherapeutic  1/2@1646 : HL 0.16, subtherepeutic  Goal of Therapy:  Heparin  level 0.3-0.7 units/ml Monitor platelets by anticoagulation protocol: Yes   Plan:  - Will bolus heparin  1600  units iv x 1 and increase infusion to 900 units/hr and repeat HL in 8 hours - Target higher end of range in setting of ACS with recent stent - Daily HL/CBC  Aleana Fifita A Orrie Lascano, PharmD Clinical Pharmacist 07/22/2024 5:35 PM          [1] No Known Allergies

## 2024-07-22 NOTE — Progress Notes (Signed)
 PHARMACY - ANTICOAGULATION CONSULT NOTE  Pharmacy Consult for UFH Indication: chest pain/ACS  Allergies[1]  Patient Measurements: Height: 5' 6 (167.6 cm) Weight: 60.2 kg (132 lb 11.5 oz) IBW/kg (Calculated) : 59.3 HEPARIN  DW (KG): 54.3  Vital Signs: Temp: 99.7 F (37.6 C) (01/02 0608) Temp Source: Oral (01/02 0404) BP: 121/70 (01/02 0608) Pulse Rate: 80 (01/02 0608)  Labs: Recent Labs    07/21/24 0243 07/21/24 1417 07/21/24 2249 07/22/24 0348 07/22/24 0640  HGB 13.8  --   --  11.5*  --   HCT 43.2  --   --  35.5*  --   PLT 404*  --   --  326  --   APTT  --  36  --   --   --   LABPROT  --  13.5  --   --   --   INR  --  1.0  --   --   --   HEPARINUNFRC  --   --  0.34  --  0.27*  CREATININE 1.67*  --   --  1.47*  --     Estimated Creatinine Clearance: 30.5 mL/min (A) (by C-G formula based on SCr of 1.47 mg/dL (H)).   Medical History: Past Medical History:  Diagnosis Date   Acute pain of left shoulder 01/17/2019   Aortic atherosclerosis    CHF (congestive heart failure) (HCC) 09/16/2013   a.) TTE 09/16/2013: EF 55-60%; mod inferior HK, triv TR; G1DD. b.) TTE 01/02/2016: EF 25-30%; mid-apicalateroseptal, lat, inf, inferoseptal, apical akinesis; triv TR; G1DD. c. TTE 04/16/2016: EF 65%; G1DD. d.) TTE 05/02/2020: EF 50-55%; G2DD. e.) TTE 04/11/2021: EF 55%; G1DD.   Chronic low back pain with bilateral sciatica    Chronic painful diabetic neuropathy (HCC)    Chronic radicular lumbar pain    CKD (chronic kidney disease), stage III (HCC)    Clostridioides difficile infection 2015   COPD (chronic obstructive pulmonary disease) (HCC)    Coronary artery disease    a.) PCI 02/06/2011: 100% mLCx - DES x 3. b.) PCI 09/17/2011: 95% dRCA - DES x 1. c.) PCI 10/02/2011: 75% pLCX - DES x 1. d.) PCI 08/19/2012: DES x 2 p-mRCA.  e.) LHC 01/01/16: Takotsubo event 01/01/2016 with patent stents   Depression    Diverticulosis    Failed back syndrome    Frequent PVCs    a. Noted in  hospital 12/2015.   GERD (gastroesophageal reflux disease)    History of bilateral cataract extraction 01/2015   History of heart artery stent    a.) TOTAL of 7 stents --> 02/06/2011 - overlapping 2.5 x 12 mm Xience V, 2.5 x 8 mm Xience V, 2.25 x 8 mm Xience Nano to mLCx; 09/17/2011 - 2.5 x 23 Xience V dRCA; 10/02/2011 - 2.5 x 15 mm Xience V pLCx; 08/09/2012 - overlapping 3.0 x 38mm mRCA and 3.5 x 23mm pRCA   Hyperlipidemia    Hypertension    Hypothyroidism    Impingement syndrome of left shoulder    LBBB (left bundle branch block)    Long term current use of clopidogrel     Marijuana abuse    Myocardial infarction [facility])    a.) multiple MIs;  5 per her report   Nephrolithiasis    OSA (obstructive sleep apnea)    a.) mild; does not require nocturnal PAP therapy   Pancreatitis 09/19/2022   Paroxysmal SVT (supraventricular tachycardia) 05/08/2021   a.)  Zio patch 05/08/2021: 3 distinct SVT runs; fastest 5 beats  at a rate of 146 bpm; longest 7 beats at rate of 134 bpm   Renal cyst, left    Stress at home    a.) husband has PTSD   T2DM (type 2 diabetes mellitus) (HCC)    Takotsubo cardiomyopathy    a. 12/2015 - nephew committed suicide 1 week prior, sister died the morning of presentation - initially called a STEMI; cath with patent stents. LVEF 25-30%.   Tendonitis of left rotator cuff    Tobacco abuse    Vaginal burning 03/22/2019   Vascular dementia     Medications:  Medications Prior to Admission  Medication Sig Dispense Refill Last Dose/Taking   acetaminophen  (TYLENOL ) 650 MG CR tablet Take 1,300 mg by mouth every 8 (eight) hours as needed for pain.   Unknown   albuterol  (VENTOLIN  HFA) 108 (90 Base) MCG/ACT inhaler Inhale 1-2 puffs into the lungs every 6 (six) hours as needed for wheezing or shortness of breath.   Unknown   amLODipine  (NORVASC ) 10 MG tablet Take 1 tablet (10 mg total) by mouth daily. 90 tablet 0 07/20/2024 Morning   aspirin  EC 81 MG tablet Take 1 tablet (81 mg  total) by mouth daily. Swallow whole.   07/20/2024 Morning   atorvastatin  (LIPITOR ) 80 MG tablet Take 1 tablet (80 mg total) by mouth daily. for cholesterol. 90 tablet 2 07/20/2024 Morning   clobetasol  cream (TEMOVATE ) 0.05 % Apply 1 Application topically 2 (two) times daily as needed. For vaginal itching. 30 g 0 Unknown   clopidogrel  (PLAVIX ) 75 MG tablet Take 1 tablet (75 mg total) by mouth daily. 90 tablet 3 07/20/2024 Morning   ezetimibe  (ZETIA ) 10 MG tablet Take 1 tablet (10 mg total) by mouth daily. For cholesterol. 90 tablet 2 07/20/2024 Morning   furosemide  (LASIX ) 20 MG tablet Take 1 tablet (20 mg total) by mouth daily as needed (As needed for weight gain greater than 3 pounds overnight or increased shortness of breath.). Take one tab only on Monday, Wednesday, & Fridays (Patient taking differently: Take 20 mg by mouth every Monday, Wednesday, and Friday.) 36 tablet 3 07/20/2024 Morning   gabapentin  (NEURONTIN ) 300 MG capsule Take 1 capsule (300 mg total) by mouth daily. May take an additional dose throughout the day if needed for pain (Patient taking differently: Take 300 mg by mouth daily as needed (Pain).) 180 capsule 2 Unknown   Glucagon  (GVOKE HYPOPEN  2-PACK) 1 MG/0.2ML SOAJ Inject 1 mg (1 pen) as needed for severe low blood sugar (sustained glucose less than 55 despite oral glucose treatments). May repeat in 15 minutes as needed. 0.4 mL 1 Unknown   insulin  aspart (NOVOLOG  FLEXPEN) 100 UNIT/ML FlexPen INJECT 5 UNITS SUBCUTANEOUSLY THREE TIMES DAILY WITH MEALS PER SLIDING SCALE (FOR DIABETES) 45 mL 1 07/20/2024 Evening   isosorbide  mononitrate (IMDUR ) 30 MG 24 hr tablet Take 1 tablet (30 mg total) by mouth daily. 90 tablet 3 07/20/2024 Morning   levothyroxine  (SYNTHROID ) 100 MCG tablet TAKE 1 TABLET BY MOUTH IN THE MORNING ON  AN  EMPTY  STOMACH  WITH  WATER   ONLY.  NO  FOOD  OR  OTHER  MEDICATIONS  FOR  30  MINUTES 90 tablet 2 07/20/2024 Morning   metoprolol  succinate (TOPROL -XL) 50 MG 24 hr  tablet Take 1 tablet (50 mg total) by mouth daily. Take with or immediately following a meal. 30 tablet 3 07/20/2024 Morning   mirtazapine  (REMERON ) 15 MG tablet Take 1 tablet (15 mg total) by mouth at bedtime. For sleep (  Patient taking differently: Take 15 mg by mouth at bedtime as needed (sleep).) 90 tablet 2 Unknown   nitroGLYCERIN  (NITROSTAT ) 0.4 MG SL tablet Place 1 tablet (0.4 mg total) under the tongue every 5 (five) minutes as needed for chest pain. 25 tablet 3 Unknown   ondansetron  (ZOFRAN ) 4 MG tablet Take 1 tablet (4 mg total) by mouth every 6 (six) hours as needed for nausea. 15 tablet 0 Unknown   potassium chloride  (KLOR-CON  M) 10 MEQ tablet Take 10 mEq by mouth every Monday, Wednesday, and Friday.   07/20/2024 Morning   sitaGLIPtin  (JANUVIA ) 100 MG tablet Take 1 tablet (100 mg total) by mouth daily. for diabetes. 90 tablet 1 07/20/2024 Morning   umeclidinium-vilanterol (ANORO ELLIPTA ) 62.5-25 MCG/ACT AEPB Inhale 1 puff into the lungs daily. 30 each 11 07/20/2024 Morning   Continuous Glucose Receiver (FREESTYLE LIBRE 2 READER) DEVI Use with sensor to check blood sugars 6 times daily. 1 each 0    Continuous Glucose Sensor (FREESTYLE LIBRE 2 SENSOR) MISC USE TO CHECK BLOOD SUGAR CHANGE EVERY 14 DAYS 6 each 3    Insulin  Pen Needle (INSUPEN PEN NEEDLES) 32G X 4 MM MISC Use to inject insulin  4 times daily. 250 each 1    nicotine  polacrilex (NICOTINE  MINI) 2 MG lozenge Take 1 lozenge (2 mg total) by mouth every 2 (two) hours as needed for smoking cessation. (Patient not taking: Reported on 06/27/2024) 72 lozenge 3 Not Taking    Assessment: 79 y/o F with a known h/o CAD and recent stent to LAD this month admitted with CP. Patient received Lovenox  this morning.   1/2: HL slightly below goal at 0.27   Goal of Therapy:  Heparin  level 0.3-0.7 units/ml Monitor platelets by anticoagulation protocol: Yes   Plan:  - Will bolus heparin  1000 units iv x 1 and increase infusion to 750 units/hr and  repeat HL in 8 hours - Target higher end of range in setting of ACS with recent stent - Daily HL/CBC  Bari Hamilton D 07/22/2024,7:30 AM        [1] No Known Allergies

## 2024-07-22 NOTE — Telephone Encounter (Signed)
 Patient Product/process Development Scientist completed.    The patient is insured through [employer].     Ran test claim for sacubitril-valsartan 24-26 mg and the current 30 day co-pay is $61.13.  Ran test claim for Farxiga 10 mg and the current 30 day co-pay is $103.34.  Ran test claim for Jardiance 10 mg and the current 30 day co-pay is $98.51.  This test claim was processed through [facility]- copay amounts may vary at other pharmacies due to pharmacy/plan contracts, or as the patient moves through the different stages of their insurance plan.     Reyes Sharps, CPHT Pharmacy Technician Patient Advocate Specialist Lead [facility] Patient Advocate Team Direct Number: [phone]  Fax: [phone]

## 2024-07-22 NOTE — Progress Notes (Signed)
 Heart Failure Stewardship Pharmacy Note  PCP: Gretta Comer POUR, NP PCP-Cardiologist: Evalene Lunger, MD  HPI: Sarah Payne is a 79 y.o. female with CAD, COPD on 2L home O2, CHF, T2DM, GERD, HTN, HLD, hypothyroidism, and OSA who presented with progressive shortness of breath. On admission, proBNP was 8644, HS-troponin was 162, and negative respirator panel. Chest x-ray noted moderate-to-severe asymmetric pulmonary edema, more severe within the right lung.   Pertinent cardiac history: 01/2011 PCI performed with DES to mid Lcx. In 08/2011 PCI with DES to proximal distal RCA. In 09/2011 PCI with DES to proximal Lcx. In 07/2012 PCI with DES to proximal RCA. TTE 08/2013 with LVEF of 55-60%. TTE 06/017 showed LVEf down to 25-30% with severe focal basal hypertrophy of the septum with akinesis of the mid-apicalanteroseptal, lateral, inferior, inferoseptal, and apical myocardium consistent with Takotsubo cardiomyopathy. TTE 03/2016 showed LVEF improved to 60-65%. LHC 04/2024 showed significant 3 vessel disease and evaluation for CABG was recommended. Underwent PCI with DES to proximal PAD, right AV groove, and distal RCA. LVEF was stable on multiple echoes prior to to this admission where TTE showed LVEF of 25-30%, G1DD, mild to moderate MR.  Pertinent Lab Values: Creat  Date Value Ref Range Status  04/04/2016 1.06 (H) 0.50 - 0.99 mg/dL Final    Comment:      For patients > or = 77 years of age: The upper reference limit for Creatinine is approximately 13% higher for people identified as African-American.      Creatinine, Ser  Date Value Ref Range Status  07/22/2024 1.47 (H) 0.44 - 1.00 mg/dL Final   BUN  Date Value Ref Range Status  07/22/2024 22 8 - 23 mg/dL Final  87/97/7974 17 8 - 27 mg/dL Final  98/86/7983 15 7 - 18 mg/dL Final   Potassium  Date Value Ref Range Status  07/22/2024 3.8 3.5 - 5.1 mmol/L Final  08/02/2014 3.1 (L) 3.5 - 5.1 mmol/L Final   Sodium  Date Value Ref Range  Status  07/22/2024 136 135 - 145 mmol/L Final    Comment:    Electrolytes repeated to verify   06/21/2024 138 134 - 144 mmol/L Final  08/02/2014 136 136 - 145 mmol/L Final   B Natriuretic Peptide  Date Value Ref Range Status  04/26/2024 262.5 (H) 0.0 - 100.0 pg/mL Final    Comment:    Performed at [facility], [address] [place], [place] [address]   Magnesium   Date Value Ref Range Status  07/22/2024 2.0 1.7 - 2.4 mg/dL Final    Comment:    Performed at [facility], [address] [place], [place] [address]  06/12/2014 1.1 (L) mg/dL Final    Comment:    8.1-7.5 THERAPEUTIC RANGE: 4-7 mg/dL TOXIC: > 10 mg/dL  ----------------------- MAGNESIUM  - Slight hemolysis, interpret results with  - caution.    Hemoglobin A1C  Date Value Ref Range Status  06/24/2024 8.0 (A) 4.0 - 5.6 % Final  08/02/2014 10.9 (H) 4.2 - 6.3 % Final    Comment:    The American Diabetes Association recommends that a primary goal of therapy should be <7% and that physicians should reevaluate the treatment regimen in patients with HbA1c values consistently >8%.    Hgb A1c MFr Bld  Date Value Ref Range Status  03/24/2024 9.2 (H) 4.6 - 6.5 % Final    Comment:    Glycemic Control Guidelines for People with Diabetes:Non Diabetic:  <6%Goal of Therapy: <7%Additional Action Suggested:  >  8%    TSH  Date Value Ref Range Status  03/24/2024 1.21 0.35 - 5.50 uIU/mL Final    Vital Signs:  Temp:  [97.4 F (36.3 C)-99.7 F (37.6 C)] 98.8 F (37.1 C) (01/02 0751) Pulse Rate:  [60-94] 80 (01/02 0751) Cardiac Rhythm: Normal sinus rhythm;Bundle branch block (01/02 0700) Resp:  [16-32] 17 (01/02 0751) BP: (103-154)/(63-99) 132/75 (01/02 0751) SpO2:  [93 %-100 %] 95 % (01/02 0751) FiO2 (%):  [45 %] 45 % (01/01 0832) Weight:  [60.2 kg (132 lb 11.5 oz)] 60.2 kg (132 lb 11.5 oz) (01/02 0500)  Intake/Output Summary (Last 24 hours) at 07/22/2024 9247 Last data filed at 07/21/2024 1222 Gross  per 24 hour  Intake --  Output 700 ml  Net -700 ml    Current Heart Failure Medications:  Loop diuretic: furosemide  40 mg IV BID Beta-Blocker: metoprolol  succinate 50 mg daily ACEI/ARB/ARNI: MRA: SGLT2i: Other: amlodipine  10 mg daily  Prior to admission Heart Failure Medications:  Loop diuretic: furosemide  20 mg MWF Beta-Blocker: metoprolol  succinate 50 mg dialy ACEI/ARB/ARNI: none MRA: none SGLT2i: none Other: none  Assessment: 1. Acute on chronic systolic heart failure (LVEF 25-30%)  , due to probable ICM with history of Taktotsubo cardiomyopathy. NYHA class III symptoms.  -Symptoms: Reports symptoms have overall improved aside from chest pain which has been persistent. Ranexa may be reasonable adjunct treatment. Reports mild, but improved orthopnea. Reports moderate but improved shortness of breath.  -Volume: Likely still has some additional volume on board. Chloride trending down, CO2 trending up, and symptoms improved - likely nearing euvolemia. Creatinine is trending down with diuretics, urine color is still lighter with reportedly good output. I/Os inaccurate. Weight charted as up 13 lbs, which is also inaccurate. Continue furosemide  40 mg IV BID today. -Hemodynamics: BP is slightly elevated. HR 80s. -BB: Continue metoprolol  succinate 50 mg daily. Can consider increasing when euvolemic. -ACEI/ARB/ARNI: Consider adding prior to discharge when creatinine is stable. Could stop or decrease amlodipine  if needed to accommodate.  -MRA: May be a good candidate for spironolactone , can consider adding 12.5 mg daily tomorrow pending renal function. -SGLT2i: Consider adding SGLT2i prior to discharge.  Plan: 1) Medication changes recommended at this time: -None at this time, can consider adding losartan 25 mg daily tomorrow if creatinine is stable.  2) Patient assistance: -Pending  3) Education: - Patient has been educated on current HF medications and potential additions to HF  medication regimen - Patient verbalizes understanding that over the next few months, these medication doses may change and more medications may be added to optimize HF regimen - Patient has been educated on basic disease state pathophysiology and goals of therapy  Please do not hesitate to reach out with questions or concerns,  Benicio Manna, PharmD, CPP, BCPS, [facility] Heart Failure Pharmacist  Phone - [phone] 07/22/2024 9:07 AM

## 2024-07-22 NOTE — Plan of Care (Signed)
" °  Problem: Education: Goal: Ability to describe self-care measures that may prevent or decrease complications (Diabetes Survival Skills Education) will improve Outcome: Progressing   Problem: Coping: Goal: Ability to adjust to condition or change in health will improve Outcome: Progressing   Problem: Fluid Volume: Goal: Ability to maintain a balanced intake and output will improve Outcome: Progressing   Problem: Health Behavior/Discharge Planning: Goal: Ability to identify and utilize available resources and services will improve Outcome: Progressing Goal: Ability to manage health-related needs will improve Outcome: Progressing   Problem: Metabolic: Goal: Ability to maintain appropriate glucose levels will improve Outcome: Progressing   Problem: Nutritional: Goal: Maintenance of adequate nutrition will improve Outcome: Progressing Goal: Progress toward achieving an optimal weight will improve Outcome: Progressing   Problem: Skin Integrity: Goal: Risk for impaired skin integrity will decrease Outcome: Progressing   Problem: Tissue Perfusion: Goal: Adequacy of tissue perfusion will improve Outcome: Progressing   Problem: Education: Goal: Ability to demonstrate management of disease process will improve Outcome: Progressing Goal: Ability to verbalize understanding of medication therapies will improve Outcome: Progressing Goal: Individualized Educational Video(s) Outcome: Progressing   Problem: Activity: Goal: Capacity to carry out activities will improve Outcome: Progressing   Problem: Cardiac: Goal: Ability to achieve and maintain adequate cardiopulmonary perfusion will improve Outcome: Progressing   Problem: Education: Goal: Knowledge of General Education information will improve Description: Including pain rating scale, medication(s)/side effects and non-pharmacologic comfort measures Outcome: Progressing   Problem: Health Behavior/Discharge Planning: Goal:  Ability to manage health-related needs will improve Outcome: Progressing   Problem: Clinical Measurements: Goal: Ability to maintain clinical measurements within normal limits will improve Outcome: Progressing Goal: Will remain free from infection Outcome: Progressing Goal: Diagnostic test results will improve Outcome: Progressing Goal: Respiratory complications will improve Outcome: Progressing Goal: Cardiovascular complication will be avoided Outcome: Progressing   Problem: Activity: Goal: Risk for activity intolerance will decrease Outcome: Progressing   Problem: Nutrition: Goal: Adequate nutrition will be maintained Outcome: Progressing   Problem: Coping: Goal: Level of anxiety will decrease Outcome: Progressing   Problem: Elimination: Goal: Will not experience complications related to bowel motility Outcome: Progressing Goal: Will not experience complications related to urinary retention Outcome: Progressing   Problem: Pain Managment: Goal: General experience of comfort will improve and/or be controlled Outcome: Progressing   Problem: Safety: Goal: Ability to remain free from injury will improve Outcome: Progressing   Problem: Skin Integrity: Goal: Risk for impaired skin integrity will decrease Outcome: Progressing   "

## 2024-07-22 NOTE — TOC Initial Note (Addendum)
 Transition of Care [facility]) - Initial/Assessment Note    Patient Details  Name: Sarah Payne MRN: [id number] Date of Birth: 1945-09-01  Transition of Care [facility]) CM/SW Contact:    Grayce JAYSON Perfect, RN Phone Number: 07/22/2024, 12:59 PM  Clinical Narrative:   RN met with patient in room, granddaughter at bedside.  Patient plans to return home once discharged from hospital .  Patient has a PCP.  Patient does drive and picks up her medications at pharmacy, no issues with paying for medications.  No Home Health Services at this time.  Patient has needed DME in the home.  Transition of Care Department [facility]) has reviewed patient and no TOC needs have been identified at this time.  If new patient transition needs arise, please place a TOC consult.                  Expected Discharge Plan: Home/Self Care Barriers to Discharge: No Barriers Identified   Patient Goals and CMS Choice            Expected Discharge Plan and Services   Discharge Planning Services: CM Consult   Living arrangements for the past 2 months: Single Family Home                         Representative spoke with at DME Agency: Patient cannot remember name of oxygen company - in [place]            Prior Living Arrangements/Services Living arrangements for the past 2 months: Single Family Home Lives with:: Self Patient language and need for interpreter reviewed:: Yes Do you feel safe going back to the place where you live?: Yes      Need for Family Participation in Patient Care: Yes (Comment) Care giver support system in place?: Yes (comment) Current home services: DME Criminal Activity/Legal Involvement Pertinent to Current Situation/Hospitalization: No - Comment as needed  Activities of Daily Living      Permission Sought/Granted                  Emotional Assessment Appearance:: Appears stated age Attitude/Demeanor/Rapport: Engaged Affect (typically observed): Adaptable Orientation: :  Oriented to Self, Oriented to Place, Oriented to  Time, Oriented to Situation Alcohol / Substance Use: Not Applicable    Admission diagnosis:  Elevated troponin level [R79.89] Hyperlipidemia LDL goal <70 [E78.5] Demand ischemia (HCC) [I24.89] Acute CHF (congestive heart failure) (HCC) [I50.9] Acute respiratory failure with hypoxia (HCC) [J96.01] Acquired hypothyroidism [E03.9] Acute hypoxemic respiratory failure (HCC) [J96.01] Insomnia, unspecified type [G47.00] Chronic obstructive pulmonary disease, unspecified COPD type (HCC) [J44.9] Acute on chronic congestive heart failure, unspecified heart failure type [facility]) [I50.9] Patient Active Problem List   Diagnosis Date Noted   Acute on chronic systolic CHF (congestive heart failure) (HCC) 07/21/2024   Acute respiratory failure with hypoxia (HCC) 07/21/2024   Acute on chronic congestive heart failure (HCC) 07/21/2024   Demand ischemia (HCC) 07/21/2024   Elevated troponin level 07/21/2024   Stable angina 06/30/2024   S/P insertion of spinal cord stimulator 12/07/2023   Chronic bilateral low back pain with bilateral sciatica 10/27/2023   Diabetes mellitus type 2 with peripheral artery disease (HCC) 09/03/2023   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   Cervical radicular pain 02/17/2023   Chronic radicular lumbar pain 02/17/2023   Chronic painful diabetic neuropathy (HCC) 01/27/2023   Spinal stenosis, lumbar region, with neurogenic claudication 01/27/2023   Loss of weight 10/06/2022   Transaminitis 09/03/2022  Status post reverse total shoulder replacement, left 10/04/2021   S/P reverse total shoulder arthroplasty, right 10/01/2021   Sinus bradycardia 10/01/2021   Chronic diastolic heart failure (HCC) 10/01/2021   Stage 3b chronic kidney disease (CKD) (HCC) 10/01/2021   Encounter for annual general medical examination with abnormal findings in adult 02/15/2021   Chronic musculoskeletal pain 11/15/2018   Atherosclerosis of native  coronary artery of native heart with stable angina pectoris 05/17/2018   Chronic neck pain 02/10/2018   Chronic pain of right upper extremity 02/10/2018   Cervical spondylosis 02/10/2018   Cervicalgia 01/19/2018   Chronic pain syndrome 01/19/2018   Lumbar degenerative disc disease 09/29/2017   Lumbar spondylosis with myelopathy 09/29/2017   Chronic bilateral back pain 05/19/2017   Insomnia 01/12/2017   Cannabis use disorder, mild, abuse 09/03/2016   Cognitive changes 09/03/2016   Takotsubo cardiomyopathy 01/02/2016   Frequent PVCs 01/02/2016   HTN (hypertension) 09/20/2015   Benign neoplasm of colon 11/02/2014   Internal hemorrhoids 11/02/2014   Adjustment disorder with mixed anxiety and depressed mood 08/16/2014   Orthostatic hypotension 08/08/2014   Esophageal reflux 05/23/2014   Diverticulosis of colon without hemorrhage 05/17/2014   Peripheral polyneuropathy 05/05/2014   Tobacco dependence 05/05/2014   COPD (chronic obstructive pulmonary disease) (HCC) 03/29/2013   Noncompliance with diabetes treatment 02/27/2013   Obstructive sleep apnea 02/25/2013   Chronic systolic congestive heart failure (HCC) 05/21/2012   Lichen sclerosus et atrophicus 05/20/2012   Bronchitis, chronic obstructive (HCC) 05/20/2012   Uncontrolled type 2 diabetes mellitus with hypoglycemia, with long-term current use of insulin  (HCC) 08/28/2011   History of lumbar fusion 06/29/2011   Depression 06/24/2011   Hypothyroidism 05/03/2011   Coronary artery disease 02/24/2011   Hyperlipidemia LDL goal <70 02/24/2011   Postural dizziness with near syncope 02/24/2011   PCP:  Gretta Comer POUR, NP Pharmacy:   [facility] [address], [place] - [address] [place] [address] Phone: [phone] Fax: [phone]  [facility] - [place], [place] - [address] [place] [phone] Phone: [phone] Fax: [phone]  [facility] -  [place], [place] - [address] [facility] [address] [place] [address] Phone: [phone] Fax: [phone]  [facility] [address] [place], [place] - [address] [place] [address] Phone: [phone] Fax: [phone]     Social Drivers of Health (SDOH) Social History: SDOH Screenings   Food Insecurity: No Food Insecurity (06/30/2024)  Housing: Low Risk (06/30/2024)  Transportation Needs: No Transportation Needs (06/30/2024)  Utilities: Not At Risk (06/30/2024)  Alcohol Screen: Low Risk (10/22/2023)  Depression (PHQ2-9): Medium Risk (06/24/2024)  Financial Resource Strain: Low Risk (10/22/2023)  Physical Activity: Insufficiently Active (10/22/2023)  Social Connections: Moderately Isolated (06/30/2024)  Stress: No Stress Concern Present (10/22/2023)  Tobacco Use: High Risk (07/21/2024)  Health Literacy: Adequate Health Literacy (10/22/2023)   SDOH Interventions:     Readmission Risk Interventions    07/22/2024   11:37 AM 07/22/2024   11:04 AM  Readmission Risk Prevention Plan  Transportation Screening Complete Complete  PCP or Specialist Appt within 3-5 Days Complete Not Complete  HRI or Home Care Consult Complete Complete  Social Work Consult for Recovery Care Planning/Counseling Complete Complete  Palliative Care Screening Not Applicable Not Applicable  Medication Review Oceanographer) Complete Complete

## 2024-07-23 DIAGNOSIS — J9601 Acute respiratory failure with hypoxia: Secondary | ICD-10-CM | POA: Diagnosis not present

## 2024-07-23 DIAGNOSIS — I160 Hypertensive urgency: Secondary | ICD-10-CM

## 2024-07-23 DIAGNOSIS — I5041 Acute combined systolic (congestive) and diastolic (congestive) heart failure: Secondary | ICD-10-CM | POA: Diagnosis not present

## 2024-07-23 DIAGNOSIS — I25118 Atherosclerotic heart disease of native coronary artery with other forms of angina pectoris: Secondary | ICD-10-CM | POA: Diagnosis not present

## 2024-07-23 DIAGNOSIS — N1832 Chronic kidney disease, stage 3b: Secondary | ICD-10-CM | POA: Diagnosis not present

## 2024-07-23 LAB — BASIC METABOLIC PANEL WITH GFR
Anion gap: 12 (ref 5–15)
BUN: 25 mg/dL — ABNORMAL HIGH (ref 8–23)
CO2: 28 mmol/L (ref 22–32)
Calcium: 8.6 mg/dL — ABNORMAL LOW (ref 8.9–10.3)
Chloride: 97 mmol/L — ABNORMAL LOW (ref 98–111)
Creatinine, Ser: 1.58 mg/dL — ABNORMAL HIGH (ref 0.44–1.00)
GFR, Estimated: 34 mL/min — ABNORMAL LOW
Glucose, Bld: 175 mg/dL — ABNORMAL HIGH (ref 70–99)
Potassium: 3.4 mmol/L — ABNORMAL LOW (ref 3.5–5.1)
Sodium: 137 mmol/L (ref 135–145)

## 2024-07-23 LAB — CBC
HCT: 31.6 % — ABNORMAL LOW (ref 36.0–46.0)
Hemoglobin: 10.3 g/dL — ABNORMAL LOW (ref 12.0–15.0)
MCH: 29.5 pg (ref 26.0–34.0)
MCHC: 32.6 g/dL (ref 30.0–36.0)
MCV: 90.5 fL (ref 80.0–100.0)
Platelets: 296 K/uL (ref 150–400)
RBC: 3.49 MIL/uL — ABNORMAL LOW (ref 3.87–5.11)
RDW: 14.1 % (ref 11.5–15.5)
WBC: 11.3 K/uL — ABNORMAL HIGH (ref 4.0–10.5)
nRBC: 0 % (ref 0.0–0.2)

## 2024-07-23 LAB — GLUCOSE, CAPILLARY
Glucose-Capillary: 247 mg/dL — ABNORMAL HIGH (ref 70–99)
Glucose-Capillary: 207 mg/dL — ABNORMAL HIGH (ref 70–99)
Glucose-Capillary: 267 mg/dL — ABNORMAL HIGH (ref 70–99)
Glucose-Capillary: 281 mg/dL — ABNORMAL HIGH (ref 70–99)

## 2024-07-23 LAB — TROPONIN T, HIGH SENSITIVITY: Troponin T High Sensitivity: 2880 ng/L (ref 0–19)

## 2024-07-23 LAB — HEPARIN LEVEL (UNFRACTIONATED)
Heparin Unfractionated: 0.27 [IU]/mL — ABNORMAL LOW (ref 0.30–0.70)
Heparin Unfractionated: 0.35 [IU]/mL (ref 0.30–0.70)
Heparin Unfractionated: 0.29 [IU]/mL — ABNORMAL LOW (ref 0.30–0.70)

## 2024-07-23 MED ORDER — HEPARIN BOLUS VIA INFUSION
800.0000 [IU] | Freq: Once | INTRAVENOUS | Status: AC
  Administered 2024-07-23: 800 [IU] via INTRAVENOUS
  Filled 2024-07-23: qty 800

## 2024-07-23 MED ORDER — POTASSIUM CHLORIDE CRYS ER 20 MEQ PO TBCR
20.0000 meq | EXTENDED_RELEASE_TABLET | Freq: Every day | ORAL | Status: DC
  Administered 2024-07-23 – 2024-07-25 (×3): 20 meq via ORAL
  Filled 2024-07-23 (×3): qty 1

## 2024-07-23 NOTE — Progress Notes (Signed)
 Patient and family refused to wear purewick during day and request to wear it at night. Patient and family prefer to use [facility] as long as staff is present. Output hat placed in [facility].

## 2024-07-23 NOTE — Plan of Care (Signed)
" °  Problem: Education: Goal: Ability to describe self-care measures that may prevent or decrease complications (Diabetes Survival Skills Education) will improve Outcome: Progressing   Problem: Coping: Goal: Ability to adjust to condition or change in health will improve Outcome: Progressing   Problem: Fluid Volume: Goal: Ability to maintain a balanced intake and output will improve Outcome: Progressing   Problem: Health Behavior/Discharge Planning: Goal: Ability to identify and utilize available resources and services will improve Outcome: Progressing Goal: Ability to manage health-related needs will improve Outcome: Progressing   Problem: Metabolic: Goal: Ability to maintain appropriate glucose levels will improve Outcome: Progressing   Problem: Nutritional: Goal: Maintenance of adequate nutrition will improve Outcome: Progressing Goal: Progress toward achieving an optimal weight will improve Outcome: Progressing   Problem: Skin Integrity: Goal: Risk for impaired skin integrity will decrease Outcome: Progressing   Problem: Tissue Perfusion: Goal: Adequacy of tissue perfusion will improve Outcome: Progressing   Problem: Education: Goal: Ability to demonstrate management of disease process will improve Outcome: Progressing Goal: Ability to verbalize understanding of medication therapies will improve Outcome: Progressing Goal: Individualized Educational Video(s) Outcome: Progressing   Problem: Activity: Goal: Capacity to carry out activities will improve Outcome: Progressing   Problem: Cardiac: Goal: Ability to achieve and maintain adequate cardiopulmonary perfusion will improve Outcome: Progressing   Problem: Education: Goal: Knowledge of General Education information will improve Description: Including pain rating scale, medication(s)/side effects and non-pharmacologic comfort measures Outcome: Progressing   Problem: Health Behavior/Discharge Planning: Goal:  Ability to manage health-related needs will improve Outcome: Progressing   Problem: Clinical Measurements: Goal: Ability to maintain clinical measurements within normal limits will improve Outcome: Progressing Goal: Will remain free from infection Outcome: Progressing Goal: Diagnostic test results will improve Outcome: Progressing Goal: Respiratory complications will improve Outcome: Progressing Goal: Cardiovascular complication will be avoided Outcome: Progressing   Problem: Activity: Goal: Risk for activity intolerance will decrease Outcome: Progressing   Problem: Nutrition: Goal: Adequate nutrition will be maintained Outcome: Progressing   Problem: Coping: Goal: Level of anxiety will decrease Outcome: Progressing   Problem: Elimination: Goal: Will not experience complications related to bowel motility Outcome: Progressing Goal: Will not experience complications related to urinary retention Outcome: Progressing   Problem: Pain Managment: Goal: General experience of comfort will improve and/or be controlled Outcome: Progressing   Problem: Safety: Goal: Ability to remain free from injury will improve Outcome: Progressing   Problem: Skin Integrity: Goal: Risk for impaired skin integrity will decrease Outcome: Progressing   "

## 2024-07-23 NOTE — Progress Notes (Signed)
 "  Rounding Note   Patient Name: Sarah Payne Date of Encounter: 07/23/2024  [facility] Cardiologist: Evalene Lunger, MD   Subjective Patient seen on a.m. rounds.  States that breathing has improved.  Continues to have some chest discomfort.  She is able to lie flat in the bed.  She has -1.3 L output in the last 24 hours.  Family remains at the bedside with concerns related to patient having to get up and down out of bed constantly to void with minimal assistance from staff.  Scheduled Meds:  amLODipine   10 mg Oral Daily   aspirin  EC  81 mg Oral Daily   atorvastatin   80 mg Oral Daily   clopidogrel   75 mg Oral Daily   ezetimibe   10 mg Oral Daily   feeding supplement (GLUCERNA SHAKE)  237 mL Oral TID BM   furosemide   60 mg Intravenous BID   insulin  aspart  0-5 Units Subcutaneous QHS   insulin  aspart  0-9 Units Subcutaneous TID WC   isosorbide  mononitrate  30 mg Oral Daily   levothyroxine   100 mcg Oral Q0600   metoprolol  succinate  50 mg Oral Daily   umeclidinium-vilanterol  1 puff Inhalation Daily   Continuous Infusions:  azithromycin  500 mg (07/22/24 1825)   cefTRIAXone  (ROCEPHIN )  IV 2 g (07/22/24 1748)   heparin  1,000 Units/hr (07/23/24 0353)   nitroGLYCERIN  15 mcg/min (07/23/24 0009)   PRN Meds: acetaminophen  **OR** acetaminophen , hydrALAZINE , ipratropium-albuterol , mirtazapine , morphine  injection, nitroGLYCERIN , ondansetron  **OR** ondansetron  (ZOFRAN ) IV, oxyCODONE    Vital Signs  Vitals:   07/22/24 2318 07/23/24 0013 07/23/24 0354 07/23/24 0500  BP: (!) 112/47 131/69 124/62   Pulse: 78 74 72   Resp: 20 18 18    Temp: 99.5 F (37.5 C)  98.6 F (37 C)   TempSrc: Oral  Oral   SpO2: 96% 98% 98%   Weight:    60.1 kg  Height:        Intake/Output Summary (Last 24 hours) at 07/23/2024 0743 Last data filed at 07/23/2024 0500 Gross per 24 hour  Intake 1059.52 ml  Output 2400 ml  Net -1340.48 ml      07/23/2024    5:00 AM 07/22/2024    5:00 AM 07/21/2024    3:11  AM  Last 3 Weights  Weight (lbs) 132 lb 7.9 oz 132 lb 11.5 oz 119 lb 11.4 oz  Weight (kg) 60.1 kg 60.2 kg 54.3 kg      Telemetry Sinus rhythm with a left bundle branch block and unifocal PVCs- Personally Reviewed  ECG  No new tracings- Personally Reviewed  Physical Exam  GEN: No acute distress.   Neck: + JVD Cardiac: RRR, no murmurs, rubs, or gallops.  Respiratory: Coarse throughout from the bases to mid-lung to auscultation bilaterally.  Respirations are unlabored at rest on 6 L of O2 via nasal cannula (patient is not on oxygen at home) GI: Soft, nontender, non-distended  MS: No edema; No deformity. Neuro:  Nonfocal  Psych: Normal affect   Labs High Sensitivity Troponin:  No results for input(s): TROPONINIHS in the last 720 hours.  Recent Labs  Lab 07/21/24 0243 07/21/24 0441 07/21/24 1259 07/21/24 1519 07/22/24 1355  TRNPT 162* 134* 662* 795* 3,076*       Chemistry Recent Labs  Lab 07/21/24 0243 07/22/24 0348 07/23/24 0226  NA 138 136 137  K 4.3 3.8 3.4*  CL 102 97* 97*  CO2 20* 24 28  GLUCOSE 296* 226* 175*  BUN 19  22 25*  CREATININE 1.67* 1.47* 1.58*  CALCIUM  9.7 9.0 8.6*  MG  --  2.0  --   PROT 7.6 6.2*  --   ALBUMIN 3.9 3.4*  --   AST 24 136*  --   ALT 14 19  --   ALKPHOS 128* 85  --   BILITOT 0.3 0.4  --   GFRNONAA 31* 37* 34*  ANIONGAP 16* 15 12    Lipids No results for input(s): CHOL, TRIG, HDL, LABVLDL, LDLCALC, CHOLHDL in the last 168 hours.  Hematology Recent Labs  Lab 07/21/24 0243 07/22/24 0348 07/23/24 0230  WBC 15.9* 17.3* 11.3*  RBC 4.69 3.92 3.49*  HGB 13.8 11.5* 10.3*  HCT 43.2 35.5* 31.6*  MCV 92.1 90.6 90.5  MCH 29.4 29.3 29.5  MCHC 31.9 32.4 32.6  RDW 14.3 14.1 14.1  PLT 404* 326 296   Thyroid  No results for input(s): TSH, FREET4 in the last 168 hours.  BNP Recent Labs  Lab 07/21/24 0243  PROBNP 8,644.0*    DDimer No results for input(s): DDIMER in the last 168 hours.   Radiology     Cardiac Studies 2d echo 07/22/2023 1. Left ventricular ejection fraction, by estimation, is 25 to 30%. The  left ventricle has severely decreased function. The left ventricle  demonstrates global hypokinesis. Left ventricular diastolic parameters are  consistent with Grade I diastolic  dysfunction (impaired relaxation). The average left ventricular global  longitudinal strain is -7.5 %. The global longitudinal strain is abnormal.   2. Right ventricular systolic function is normal. The right ventricular  size is normal.   3. The mitral valve is normal in structure. Mild to moderate mitral valve  regurgitation. No evidence of mitral stenosis.   4. The aortic valve is normal in structure. Aortic valve regurgitation is  not visualized. Aortic valve sclerosis is present, with no evidence of  aortic valve stenosis.   5. The inferior vena cava is normal in size with greater than 50%  respiratory variability, suggesting right atrial pressure of 3 mmHg.   Patient Profile   79 y.o. female with a past medical history significant for coronary artery disease with prior stent x 3 placed to the mid left circumflex and moderate to severe LAD disease with a last heart catheterization 06/21/2024 with stent placement to the proximal LAD, depression/anxiety, hypertension, smoking history, COPD, poorly controlled diabetes with hemoglobin A1c of 10, who is being seen and evaluated for progressive worsening shortness of breath and chest discomfort.  Assessment & Plan  Acute on chronic respiratory failure with hypoxia -Long smoking history  -Recent viral syndrome concerning for flu -Currently maintaining oxygen saturations on 6 L of O2 via nasal cannula, of note patient has not required oxygen at home -Titrate FiO2 to maintain oxygen saturations of greater than equal to 92% -Previously required BiPAP in the emergency department -Respiratory panel negative for flu, RSV, COVID -Chest x-ray revealing moderate  to severe pulmonary edema more severe within the right lung, mild cardiomegaly, stable thoracic spinal stimulator device, and left shoulder arthroplasty -Supportive care  Acute HFrEF -Patient presented with progressive worsening shortness of breath to the emergency department -Chest x-ray consistent with pulmonary edema -ProBNP 8644 -Continued on furosemide  60 mg IV twice daily -Continued on Toprol -XL -Echocardiogram revealed significant drop in LVEF of 25-30% -Previous echo revealed LVEF 55-60% - -1.3L output in the last 24 hours -Heart failure education -Asked nursing to replace pure wick to help with voiding, with patient on IV furosemide  and to  high risk medication drips it would be best for her to have minimal activity to prevent escalation of chest pain -Daily weights and I's and O's -Escalate GDMT as tolerated by kidney function -Will need further ischemic evaluation with significant drop in LVEF, tentatively scheduled for right and left heart catheterization today but continues to appear to be volume up would likely benefit from procedure Monday and to continue to be diuresed over the weekend  Coronary artery disease with stable angina -Recent stent placement to the LAD earlier in December 2025 - Nitro drip currently infusing at 15 mics per minute -Reports she has been chest pain-free this morning -High-sensitivity troponins peaked at 3,076, additional one ordered to assess trend - Continued on heparin  infusion -Continued on aspirin  81 mg daily, atorvastatin  80 mg daily, clopidogrel  75 mg daily, ezetimibe  10 mg daily, Imdur  30 mg daily, -Will need further ischemic evaluation with drop in LVEF once breathing has improved -Continue on telemetry monitoring -EKG as needed for pain or changes  Hypertension -Presented with hypertensive urgency -Nitroglycerin  drip currently at 15 mcg/min -BP 124/62 -Continued on current medication regimen -Vital signs per unit protocol  Mild  hypokalemia -Potassium 3.4 -Potassium supplementation ordered -Monitor/trend/replete electrolytes as needed -Daily BMP  Hyperlipidemia -Last LDL 44 -Continue on atorvastatin  80, ezetimibe  10 mg daily, PTA PCSK9 inhibitor on hold for hospitalization  Chronic kidney disease stage 3B -Serum creatinine 1.58 -Baseline serum creatinine around 1.3 -Slight improvement from yesterday 1.67 - Daily BMP while on IV diuretic therapy monitoring kidney function closely -Monitor urine output -Monitor/trend/replete electrolytes -Avoid nephrotoxic agents were able  Longstanding history of COPD not currently in exacerbation and a former smoker -Continued on inhalers -Has DuoNebs  Peripheral arterial disease/carotid artery stenosis -Patient has previously had intervention to the left SFA most recent ABI in 09/2022 was stable -Carotid ultrasound completed 3/24 showed 1-39% bilateral ICA stenosis -Continued on DAPT and statin therapy  Type 2 diabetes -Continued on sliding scale insulin  -Poorly controlled with A1c of 10 -Ongoing management per IM  Intermittent left bundle branch block -Chronic finding on EKGs - Noted on telemetry monitoring - Continue with telemetry monitoring     For questions or updates, please contact [facility] Please consult [email or web address] for contact info under       Signed, Briante Loveall, NP  07/23/2024, 7:43 AM    "

## 2024-07-23 NOTE — Progress Notes (Signed)
 PHARMACY - ANTICOAGULATION CONSULT NOTE  Pharmacy Consult for [facility] Indication: chest pain/ACS  Allergies[1]  Patient Measurements: Height: 5' 6 (167.6 cm) Weight: 60.1 kg (132 lb 7.9 oz) IBW/kg (Calculated) : 59.3 HEPARIN  DW (KG): 54.3  Labs: Recent Labs    07/21/24 0243 07/21/24 1417 07/21/24 2249 07/22/24 0348 07/22/24 0640 07/22/24 1646 07/23/24 0226 07/23/24 0230 07/23/24 1204  HGB 13.8  --   --  11.5*  --   --   --  10.3*  --   HCT 43.2  --   --  35.5*  --   --   --  31.6*  --   PLT 404*  --   --  326  --   --   --  296  --   APTT  --  36  --   --   --   --   --   --   --   LABPROT  --  13.5  --   --   --   --   --   --   --   INR  --  1.0  --   --   --   --   --   --   --   HEPARINUNFRC  --   --    < >  --    < > 0.16*  --  0.27* 0.35  CREATININE 1.67*  --   --  1.47*  --   --  1.58*  --   --    < > = values in this interval not displayed.    Estimated Creatinine Clearance: 28.4 mL/min (A) (by C-G formula based on SCr of 1.58 mg/dL (H)).   Medical History: Past Medical History:  Diagnosis Date   Acute pain of left shoulder 01/17/2019   Aortic atherosclerosis    CHF (congestive heart failure) (HCC) 09/16/2013   a.) TTE 09/16/2013: EF 55-60%; mod inferior HK, triv TR; G1DD. b.) TTE 01/02/2016: EF 25-30%; mid-apicalateroseptal, lat, inf, inferoseptal, apical akinesis; triv TR; G1DD. c. TTE 04/16/2016: EF 65%; G1DD. d.) TTE 05/02/2020: EF 50-55%; G2DD. e.) TTE 04/11/2021: EF 55%; G1DD.   Chronic low back pain with bilateral sciatica    Chronic painful diabetic neuropathy (HCC)    Chronic radicular lumbar pain    CKD (chronic kidney disease), stage III (HCC)    Clostridioides difficile infection 2015   COPD (chronic obstructive pulmonary disease) (HCC)    Coronary artery disease    a.) PCI 02/06/2011: 100% mLCx - DES x 3. b.) PCI 09/17/2011: 95% dRCA - DES x 1. c.) PCI 10/02/2011: 75% pLCX - DES x 1. d.) PCI 08/19/2012: DES x 2 p-mRCA.  e.) LHC 01/01/16: Takotsubo  event 01/01/2016 with patent stents   Depression    Diverticulosis    Failed back syndrome    Frequent PVCs    a. Noted in hospital 12/2015.   GERD (gastroesophageal reflux disease)    History of bilateral cataract extraction 01/2015   History of heart artery stent    a.) TOTAL of 7 stents --> 02/06/2011 - overlapping 2.5 x 12 mm Xience V, 2.5 x 8 mm Xience V, 2.25 x 8 mm Xience Nano to mLCx; 09/17/2011 - 2.5 x 23 Xience V dRCA; 10/02/2011 - 2.5 x 15 mm Xience V pLCx; 08/09/2012 - overlapping 3.0 x 38mm mRCA and 3.5 x 23mm pRCA   Hyperlipidemia    Hypertension    Hypothyroidism    Impingement syndrome of left shoulder  LBBB (left bundle branch block)    Long term current use of clopidogrel     Marijuana abuse    Myocardial infarction 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 12/2015 - nephew committed suicide 1 week prior, sister died the morning of presentation - initially called a STEMI; cath with patent stents. LVEF 25-30%.   Tendonitis of left rotator cuff    Tobacco abuse    Vaginal burning 03/22/2019   Vascular dementia    Assessment: 79 y/o F with a known h/o CAD and recent stent to LAD this month admitted with CP. Patient received Lovenox  this morning.   1/2@0640 : HL 0.27, subtherapeutic  1/2@1646 : HL 0.16, subtherepeutic 1/3@0230 : HL 0.27, SUBtherapeutic  1/3@1204 : HL 0.35, therapeutic x 1  Goal of Therapy:  Heparin  level 0.3-0.7 units/ml Monitor platelets by anticoagulation protocol: Yes   Plan:  --Heparin  level is  therapeutic x 1 --Continue heparin  infusion at 1000 units/hr --Re-check confirmatory HL in 8 hours --Daily CBC per protocol while on IV heparin   Kedarius Aloisi B Wilmot Quevedo 07/23/2024 1:09 PM    [1] No Known Allergies

## 2024-07-23 NOTE — Progress Notes (Signed)
 PHARMACY - ANTICOAGULATION CONSULT NOTE  Pharmacy Consult for UFH Indication: chest pain/ACS  Allergies[1]  Patient Measurements: Height: 5' 6 (167.6 cm) Weight: 60.1 kg (132 lb 7.9 oz) IBW/kg (Calculated) : 59.3 HEPARIN  DW (KG): 54.3  Labs: Recent Labs    07/21/24 0243 07/21/24 1417 07/21/24 2249 07/22/24 0348 07/22/24 0640 07/23/24 0226 07/23/24 0230 07/23/24 1204 07/23/24 1957  HGB 13.8  --   --  11.5*  --   --  10.3*  --   --   HCT 43.2  --   --  35.5*  --   --  31.6*  --   --   PLT 404*  --   --  326  --   --  296  --   --   APTT  --  36  --   --   --   --   --   --   --   LABPROT  --  13.5  --   --   --   --   --   --   --   INR  --  1.0  --   --   --   --   --   --   --   HEPARINUNFRC  --   --    < >  --    < >  --  0.27* 0.35 0.29*  CREATININE 1.67*  --   --  1.47*  --  1.58*  --   --   --    < > = values in this interval not displayed.    Estimated Creatinine Clearance: 28.4 mL/min (A) (by C-G formula based on SCr of 1.58 mg/dL (H)).   Medical History: Past Medical History:  Diagnosis Date   Acute pain of left shoulder 01/17/2019   Aortic atherosclerosis    CHF (congestive heart failure) (HCC) 09/16/2013   a.) TTE 09/16/2013: EF 55-60%; mod inferior HK, triv TR; G1DD. b.) TTE 01/02/2016: EF 25-30%; mid-apicalateroseptal, lat, inf, inferoseptal, apical akinesis; triv TR; G1DD. c. TTE 04/16/2016: EF 65%; G1DD. d.) TTE 05/02/2020: EF 50-55%; G2DD. e.) TTE 04/11/2021: EF 55%; G1DD.   Chronic low back pain with bilateral sciatica    Chronic painful diabetic neuropathy (HCC)    Chronic radicular lumbar pain    CKD (chronic kidney disease), stage III (HCC)    Clostridioides difficile infection 2015   COPD (chronic obstructive pulmonary disease) (HCC)    Coronary artery disease    a.) PCI 02/06/2011: 100% mLCx - DES x 3. b.) PCI 09/17/2011: 95% dRCA - DES x 1. c.) PCI 10/02/2011: 75% pLCX - DES x 1. d.) PCI 08/19/2012: DES x 2 p-mRCA.  e.) LHC 01/01/16: Takotsubo  event 01/01/2016 with patent stents   Depression    Diverticulosis    Failed back syndrome    Frequent PVCs    a. Noted in hospital 12/2015.   GERD (gastroesophageal reflux disease)    History of bilateral cataract extraction 01/2015   History of heart artery stent    a.) TOTAL of 7 stents --> 02/06/2011 - overlapping 2.5 x 12 mm Xience V, 2.5 x 8 mm Xience V, 2.25 x 8 mm Xience Nano to mLCx; 09/17/2011 - 2.5 x 23 Xience V dRCA; 10/02/2011 - 2.5 x 15 mm Xience V pLCx; 08/09/2012 - overlapping 3.0 x 38mm mRCA and 3.5 x 23mm pRCA   Hyperlipidemia    Hypertension    Hypothyroidism    Impingement syndrome of left shoulder  LBBB (left bundle branch block)    Long term current use of clopidogrel     Marijuana abuse    Myocardial infarction [facility])    a.) multiple MIs;  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 12/2015 - nephew committed suicide 1 week prior, sister died the morning of presentation - initially called a STEMI; cath with patent stents. LVEF 25-30%.   Tendonitis of left rotator cuff    Tobacco abuse    Vaginal burning 03/22/2019   Vascular dementia    Assessment: 79 y/o F with a known h/o CAD and recent stent to LAD this month admitted with CP. Patient received Lovenox  this morning.   1/2@0640 : HL 0.27, subtherapeutic  1/2@1646 : HL 0.16, subtherepeutic 1/3@0230 : HL 0.27, SUBtherapeutic  1/3@1204 : HL 0.35, therapeutic x 1 1/3@1957 : HL 0.29, SUBtherapeutic   Goal of Therapy:  Heparin  level 0.3-0.7 units/ml Monitor platelets by anticoagulation protocol:  Yes   Plan:  1/3:  HL @ 1957 = 0.29, SUBtherapeutic - Will order heparin  800 units IV X 1 bolus and increase drip rate to 1100 units/hr - recheck HL 8 hrs after rate change  --Daily CBC per protocol while on IV heparin   Sarah Payne D 07/23/2024 10:21 PM     [1] No Known Allergies

## 2024-07-23 NOTE — Progress Notes (Signed)
 PHARMACY - ANTICOAGULATION CONSULT NOTE  Pharmacy Consult for UFH Indication: chest pain/ACS  Allergies[1]  Patient Measurements: Height: 5' 6 (167.6 cm) Weight: 60.2 kg (132 lb 11.5 oz) IBW/kg (Calculated) : 59.3 HEPARIN  DW (KG): 54.3  Vital Signs: Temp: 99.5 F (37.5 C) (01/02 2318) Temp Source: Oral (01/02 2318) BP: 131/69 (01/03 0013) Pulse Rate: 74 (01/03 0013)  Labs: Recent Labs    07/21/24 0243 07/21/24 1417 07/21/24 2249 07/22/24 0348 07/22/24 0640 07/22/24 1646 07/23/24 0226 07/23/24 0230  HGB 13.8  --   --  11.5*  --   --   --  10.3*  HCT 43.2  --   --  35.5*  --   --   --  31.6*  PLT 404*  --   --  326  --   --   --  296  APTT  --  36  --   --   --   --   --   --   LABPROT  --  13.5  --   --   --   --   --   --   INR  --  1.0  --   --   --   --   --   --   HEPARINUNFRC  --   --    < >  --  0.27* 0.16*  --  0.27*  CREATININE 1.67*  --   --  1.47*  --   --  1.58*  --    < > = values in this interval not displayed.    Estimated Creatinine Clearance: 28.4 mL/min (A) (by C-G formula based on SCr of 1.58 mg/dL (H)).   Medical History: Past Medical History:  Diagnosis Date   Acute pain of left shoulder 01/17/2019   Aortic atherosclerosis    CHF (congestive heart failure) (HCC) 09/16/2013   a.) TTE 09/16/2013: EF 55-60%; mod inferior HK, triv TR; G1DD. b.) TTE 01/02/2016: EF 25-30%; mid-apicalateroseptal, lat, inf, inferoseptal, apical akinesis; triv TR; G1DD. c. TTE 04/16/2016: EF 65%; G1DD. d.) TTE 05/02/2020: EF 50-55%; G2DD. e.) TTE 04/11/2021: EF 55%; G1DD.   Chronic low back pain with bilateral sciatica    Chronic painful diabetic neuropathy (HCC)    Chronic radicular lumbar pain    CKD (chronic kidney disease), stage III (HCC)    Clostridioides difficile infection 2015   COPD (chronic obstructive pulmonary disease) (HCC)    Coronary artery disease    a.) PCI 02/06/2011: 100% mLCx - DES x 3. b.) PCI 09/17/2011: 95% dRCA - DES x 1. c.) PCI  10/02/2011: 75% pLCX - DES x 1. d.) PCI 08/19/2012: DES x 2 p-mRCA.  e.) LHC 01/01/16: Takotsubo event 01/01/2016 with patent stents   Depression    Diverticulosis    Failed back syndrome    Frequent PVCs    a. Noted in hospital 12/2015.   GERD (gastroesophageal reflux disease)    History of bilateral cataract extraction 01/2015   History of heart artery stent    a.) TOTAL of 7 stents --> 02/06/2011 - overlapping 2.5 x 12 mm Xience V, 2.5 x 8 mm Xience V, 2.25 x 8 mm Xience Nano to mLCx; 09/17/2011 - 2.5 x 23 Xience V dRCA; 10/02/2011 - 2.5 x 15 mm Xience V pLCx; 08/09/2012 - overlapping 3.0 x 38mm mRCA and 3.5 x 23mm pRCA   Hyperlipidemia    Hypertension    Hypothyroidism    Impingement syndrome of left shoulder    LBBB (left  bundle branch block)    Long term current use of clopidogrel     Marijuana abuse    Myocardial infarction [facility])    a.) multiple MIs;  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 12/2015 - nephew committed suicide 1 week prior, sister died the morning of presentation - initially called a STEMI; cath with patent stents. LVEF 25-30%.   Tendonitis of left rotator cuff    Tobacco abuse    Vaginal burning 03/22/2019   Vascular dementia     Medications:  Medications Prior to Admission  Medication Sig Dispense Refill Last Dose/Taking   acetaminophen  (TYLENOL ) 650 MG CR tablet Take 1,300 mg by mouth every 8 (eight) hours as needed for pain.   Unknown   albuterol  (VENTOLIN  HFA) 108 (90 Base) MCG/ACT inhaler Inhale 1-2 puffs into the lungs every 6 (six)  hours as needed for wheezing or shortness of breath.   Unknown   amLODipine  (NORVASC ) 10 MG tablet Take 1 tablet (10 mg total) by mouth daily. 90 tablet 0 07/20/2024 Morning   aspirin  EC 81 MG tablet Take 1 tablet (81 mg total) by mouth daily. Swallow whole.   07/20/2024 Morning   atorvastatin  (LIPITOR ) 80 MG tablet Take 1 tablet (80 mg total) by mouth daily. for cholesterol. 90 tablet 2 07/20/2024 Morning   clobetasol  cream (TEMOVATE ) 0.05 % Apply 1 Application topically 2 (two) times daily as needed. For vaginal itching. 30 g 0 Unknown   clopidogrel  (PLAVIX ) 75 MG tablet Take 1 tablet (75 mg total) by mouth daily. 90 tablet 3 07/20/2024 Morning   ezetimibe  (ZETIA ) 10 MG tablet Take 1 tablet (10 mg total) by mouth daily. For cholesterol. 90 tablet 2 07/20/2024 Morning   furosemide  (LASIX ) 20 MG tablet Take 1 tablet (20 mg total) by mouth daily as needed (As needed for weight gain greater than 3 pounds overnight or increased shortness of breath.). Take one tab only on Monday, Wednesday, & Fridays (Patient taking differently: Take 20 mg by mouth every Monday, Wednesday, and Friday.) 36 tablet 3 07/20/2024 Morning   gabapentin  (NEURONTIN ) 300 MG capsule Take 1 capsule (300 mg total) by mouth daily. May take an additional dose throughout the day if needed for pain (Patient taking differently: Take 300 mg by mouth daily as needed (Pain).) 180 capsule 2 Unknown   Glucagon  (GVOKE HYPOPEN  2-PACK) 1 MG/0.2ML SOAJ Inject 1 mg (1 pen) as needed for severe low blood sugar (sustained glucose less than 55 despite oral glucose treatments). May repeat in 15 minutes as needed. 0.4 mL 1 Unknown   insulin  aspart (NOVOLOG  FLEXPEN) 100 UNIT/ML FlexPen INJECT 5 UNITS SUBCUTANEOUSLY THREE TIMES DAILY WITH MEALS PER SLIDING SCALE (FOR DIABETES) 45 mL 1 07/20/2024 Evening   isosorbide  mononitrate (IMDUR ) 30 MG 24 hr tablet Take 1 tablet (30 mg total) by mouth daily. 90 tablet 3 07/20/2024 Morning   levothyroxine  (SYNTHROID ) 100  MCG tablet TAKE 1 TABLET BY MOUTH IN THE MORNING ON  AN  EMPTY  STOMACH  WITH  WATER   ONLY.  NO  FOOD  OR  OTHER  MEDICATIONS  FOR  30  MINUTES 90 tablet 2 07/20/2024 Morning   metoprolol  succinate (TOPROL -XL) 50 MG 24 hr tablet Take 1 tablet (50 mg total) by mouth daily. Take with or immediately following a meal. 30 tablet 3 07/20/2024 Morning   mirtazapine  (REMERON ) 15 MG tablet Take 1 tablet (15 mg total) by mouth at bedtime. For sleep (Patient taking differently: Take 15 mg by mouth at bedtime as needed (sleep).) 90 tablet 2 Unknown   nitroGLYCERIN  (NITROSTAT ) 0.4 MG SL tablet Place 1 tablet (0.4 mg total) under the tongue every 5 (five) minutes as needed for chest pain. 25 tablet 3 Unknown   ondansetron  (ZOFRAN ) 4 MG tablet Take 1 tablet (4 mg total) by mouth every 6 (six) hours as needed for nausea. 15 tablet 0 Unknown   potassium chloride  (KLOR-CON  M) 10 MEQ tablet Take 10 mEq by mouth every Monday, Wednesday, and Friday.   07/20/2024 Morning   sitaGLIPtin  (JANUVIA ) 100 MG tablet Take 1 tablet (100 mg total) by mouth daily. for diabetes. 90 tablet 1 07/20/2024 Morning   umeclidinium-vilanterol (ANORO ELLIPTA ) 62.5-25 MCG/ACT AEPB Inhale 1 puff into the lungs daily. 30 each 11 07/20/2024 Morning   Continuous Glucose Receiver (FREESTYLE LIBRE 2 READER) DEVI Use with sensor to check blood sugars 6 times daily. 1 each 0    Continuous Glucose Sensor (FREESTYLE LIBRE 2 SENSOR) MISC USE TO CHECK BLOOD SUGAR CHANGE EVERY 14 DAYS 6 each 3    Insulin  Pen Needle (INSUPEN PEN NEEDLES) 32G X 4 MM MISC Use to inject insulin  4 times daily. 250 each 1    nicotine  polacrilex (NICOTINE  MINI) 2 MG lozenge Take 1 lozenge (2 mg total) by mouth every 2 (two) hours as needed for smoking cessation. (Patient not taking: Reported on 06/27/2024) 72 lozenge 3 Not Taking    Assessment: 79 y/o F with a known h/o CAD and recent stent to LAD this month admitted with CP. Patient received Lovenox  this morning.   1/2@0640 : HL  0.27, subtherapeutic  1/2@1646 : HL 0.16, subtherepeutic 1/3@0230 : HL 0.27, SUBtherapeutic   Goal of Therapy:  Heparin  level 0.3-0.7 units/ml Monitor platelets by anticoagulation protocol: Yes   Plan:  1/3: HL @ 0230 = 0.27, SUBtherapeutic - Will order heparin  800 units IV X 1 bolus and increase drip rate to 1000 units/hr  - Will recheck HL 8 hrs after rate change  - Daily HL/CBC  Nayan Proch D, PharmD Clinical Pharmacist 07/23/2024 3:39 AM           [1] No Known Allergies

## 2024-07-23 NOTE — Progress Notes (Signed)
 " Progress Note   Patient: Sarah Payne FMW:[id number] DOB: [birth year] DOA: 07/21/2024     2 DOS: the patient was seen and examined on 07/23/2024   Brief hospital course: The patient is a 79 yr old woman who presented to [facility] ED on 07/21/2024 with complaints of shortness of breath that has worsened over the previous 2 days. Upon presentation to the ED the patient who ordinarily requires 2L O2 was requiring 15L NRB to bring her saturations up to 85%.   The patient carries a past medical history significant for CAD (s/p recent PCI), COPD with chronic hypoxic respiratory failure (2L O2 chronically), CHF, DM II , GERD, HTN, Hyperlipidemia, and hypothyroidism.  In the ED she is found to be hypoxemic with only 85% on non-rebreather 15L, BP 2-4/143, tachycardia at 120, tachypnea at 35, WBC of 15.9, BNP of 8644, Creatinine of 1.67, AG of 16, Troponin of 162. COVID, RSV, and Flu A & B were negative. CXR demonstrated flash pulmonary edema. EKG demonstrated AF/Flutter with a rate of 120 with a left bundle branch block. Repeat EKG demonstrated SNF with left bundle.   The patient was admitted to a progressive bed with lasix  for diuresis. She was on BIPAP  with oxygen. Cardiology was consulted. She has been placed on a nitroglycerin  drip as well as a heparin  infusion. Echocardiogram has demonstrated a decrease in the patient's EF from a previous 55% in mid December to 25%. She will likely require a LHC once stabilized.  She has been placed on inhalers, nebulizers for her COPD exacerbation. CXR has demonstrated moderate to severe asymmetric pulmonary edema right greater than left. This may also represent an infiltrate/consolidate. The patient has been started on Rocephin  and azithromycin .  The patient's echocardiogram demonstrated an EF of 20-25%. Cardiology was consulted. She will undergo right and left heart catheterization on Monday.  Assessment and Plan:  Principal Problem:   Acute hypoxemic respiratory failure  (HCC) Active Problems:   Chronic pain syndrome   Uncontrolled type 2 diabetes mellitus with hypoglycemia, with long-term current use of insulin  (HCC)   COPD (chronic obstructive pulmonary disease) (HCC)   Mixed hyperlipidemia   Hypothyroidism   Chronic systolic congestive heart failure (HCC)   Acute on chronic systolic CHF (congestive heart failure) (HCC)     Assessment and Plan: 79 year old female who has a history of CAD s/p recent PCI, chronic congestive heart failure, COPD on home oxygen 2 L, chronic pain syndrome, HTN, HLD, diabetes who came into ED complaining of sudden onset of shortness of breath, has pulmonary edema.     1.  Acute on chronic hypoxemic respiratory failure in the setting of CHF and COPD - She had a severe hypoxemic respiratory failure requiring BiPAP. - She will be admitted to hospital as inpatient. - Continue BiPAP and try to wean as soon as we can, maintain saturation more than 90% - Patient received IV Lasix  with some improvement. - Continue Lasix , input output charting, daily weight, cardiology evaluation.  2. Possible CAP as a source of the patient's COPD exacerbation. The patient has been started on Rocephin  and Azithromycin . Patient presented with a leukocytosis of 17, possible infiltrate vs pulmonary edema on CXR, and procalcitonin of 4/19. Blood cultures x 2 ordered.   2.  Acute on chronic congestive heart failure likely diastolic - Last EF was 55 to 60%, it has declined to 20-25%. - Patient received Lasix  in the emergency room and I will continue Lasix  40 mg twice daily. - BNP of  8644 - CHF pathway - Cardiology was consulted. The patient will go for a right and left heart catheterization on Monday.   3.  COPD without any exacerbation - She does not have much wheezing. - Will continue her on BiPAP, give her nebulization, monitor oxygen to maintain saturation more than 90%   4.  CAD s/p PCI - Continue aspirin  Plavix , statin atorvastatin , nitrate and  Zetia  - Cardiology was consulted. The patient will go for a right and left heart catheterization on Monday.   5.  Hypothyroidism - Resume her home dose of levothyroxine    6.  HTN - Continue metoprolol  50 mg and amlodipine  - Hydralazine  for as needed   7.  Type 2 diabetes - Continue insulin  sliding scale - Adjust insulin  as needed       DVT prophylaxis: Lovenox  Code Status: Full Code Family Communication: Family at bedside Disposition Plan: Home Consults called: Cardiology Admission status: Inpatient, progressive       Subjective: The patient is resting comfortably. No new complaints.   Physical Exam: Vitals:   07/23/24 0500 07/23/24 0932 07/23/24 1238 07/23/24 1717  BP:  130/82 115/87 125/73  Pulse:  62 73 65  Resp:  18 18 14   Temp:  98.1 F (36.7 C) 98.2 F (36.8 C) 98.5 F (36.9 C)  TempSrc:   Oral Oral  SpO2:  100% 99% 96%  Weight: 60.1 kg     Height:       Exam:  Constitutional:  The patient is awake, alert, and oriented x 3. No acute distress. Respiratory:  No increased work of breathing. Coarse rales throughout No tactile fremitus Cardiovascular:  Regular rate and rhythm No murmurs, ectopy, or gallups. No lateral PMI. No thrills. Abdomen:  Abdomen is soft, non-tender, non-distended No hernias, masses, or organomegaly Normoactive bowel sounds.  Musculoskeletal:  No cyanosis, clubbing, or edema Skin:  No rashes, lesions, ulcers palpation of skin: no induration or nodules Neurologic:  CN 2-12 intact Sensation all 4 extremities intact Psychiatric:  Mental status Mood, affect appropriate Orientation to person, place, time  judgment and insight appear intact  Data Reviewed:  CBC, BMP, CXR, Echocardiogram.  Family Communication: Family at bedside and on phone during visit.  Disposition: Status is: Inpatient Remains inpatient appropriate because: Acutely ill with CAP, COPD exac, and CHF exacerbation. Likely will need left heart  catheterization.  Planned Discharge Destination: tbd    Time spent: 46 minutes  Author: Kinga Cassar, DO 07/23/2024 5:41 PM  For on call review [email or web address].  "

## 2024-07-24 DIAGNOSIS — R7989 Other specified abnormal findings of blood chemistry: Secondary | ICD-10-CM | POA: Diagnosis not present

## 2024-07-24 DIAGNOSIS — J432 Centrilobular emphysema: Secondary | ICD-10-CM | POA: Diagnosis not present

## 2024-07-24 DIAGNOSIS — Z794 Long term (current) use of insulin: Secondary | ICD-10-CM

## 2024-07-24 DIAGNOSIS — I2489 Other forms of acute ischemic heart disease: Secondary | ICD-10-CM

## 2024-07-24 DIAGNOSIS — J9601 Acute respiratory failure with hypoxia: Secondary | ICD-10-CM

## 2024-07-24 DIAGNOSIS — J189 Pneumonia, unspecified organism: Secondary | ICD-10-CM | POA: Diagnosis not present

## 2024-07-24 DIAGNOSIS — E785 Hyperlipidemia, unspecified: Secondary | ICD-10-CM

## 2024-07-24 DIAGNOSIS — N1832 Chronic kidney disease, stage 3b: Secondary | ICD-10-CM

## 2024-07-24 DIAGNOSIS — E11649 Type 2 diabetes mellitus with hypoglycemia without coma: Secondary | ICD-10-CM

## 2024-07-24 DIAGNOSIS — I5023 Acute on chronic systolic (congestive) heart failure: Secondary | ICD-10-CM | POA: Diagnosis not present

## 2024-07-24 DIAGNOSIS — I10 Essential (primary) hypertension: Secondary | ICD-10-CM

## 2024-07-24 DIAGNOSIS — I25118 Atherosclerotic heart disease of native coronary artery with other forms of angina pectoris: Secondary | ICD-10-CM | POA: Diagnosis not present

## 2024-07-24 DIAGNOSIS — Z8679 Personal history of other diseases of the circulatory system: Secondary | ICD-10-CM | POA: Diagnosis not present

## 2024-07-24 DIAGNOSIS — I5041 Acute combined systolic (congestive) and diastolic (congestive) heart failure: Secondary | ICD-10-CM | POA: Diagnosis not present

## 2024-07-24 DIAGNOSIS — E039 Hypothyroidism, unspecified: Secondary | ICD-10-CM

## 2024-07-24 LAB — BASIC METABOLIC PANEL WITH GFR
Anion gap: 11 (ref 5–15)
BUN: 27 mg/dL — ABNORMAL HIGH (ref 8–23)
CO2: 29 mmol/L (ref 22–32)
Calcium: 8.6 mg/dL — ABNORMAL LOW (ref 8.9–10.3)
Chloride: 99 mmol/L (ref 98–111)
Creatinine, Ser: 1.57 mg/dL — ABNORMAL HIGH (ref 0.44–1.00)
GFR, Estimated: 34 mL/min — ABNORMAL LOW
Glucose, Bld: 155 mg/dL — ABNORMAL HIGH (ref 70–99)
Potassium: 3.4 mmol/L — ABNORMAL LOW (ref 3.5–5.1)
Sodium: 139 mmol/L (ref 135–145)

## 2024-07-24 LAB — CBC
HCT: 31.5 % — ABNORMAL LOW (ref 36.0–46.0)
Hemoglobin: 10.4 g/dL — ABNORMAL LOW (ref 12.0–15.0)
MCH: 29.6 pg (ref 26.0–34.0)
MCHC: 33 g/dL (ref 30.0–36.0)
MCV: 89.7 fL (ref 80.0–100.0)
Platelets: 283 K/uL (ref 150–400)
RBC: 3.51 MIL/uL — ABNORMAL LOW (ref 3.87–5.11)
RDW: 13.9 % (ref 11.5–15.5)
WBC: 7.8 K/uL (ref 4.0–10.5)
nRBC: 0 % (ref 0.0–0.2)

## 2024-07-24 LAB — GLUCOSE, CAPILLARY
Glucose-Capillary: 231 mg/dL — ABNORMAL HIGH (ref 70–99)
Glucose-Capillary: 400 mg/dL — ABNORMAL HIGH (ref 70–99)
Glucose-Capillary: 123 mg/dL — ABNORMAL HIGH (ref 70–99)
Glucose-Capillary: 323 mg/dL — ABNORMAL HIGH (ref 70–99)

## 2024-07-24 LAB — HEPARIN LEVEL (UNFRACTIONATED)
Heparin Unfractionated: 0.43 [IU]/mL (ref 0.30–0.70)
Heparin Unfractionated: 0.35 [IU]/mL (ref 0.30–0.70)

## 2024-07-24 MED ORDER — INSULIN ASPART 100 UNIT/ML IJ SOLN: 0.0000 [IU] | Freq: Three times a day (TID) | INTRAMUSCULAR | Status: DC

## 2024-07-24 MED ORDER — INSULIN ASPART 100 UNIT/ML IJ SOLN
0.0000 [IU] | Freq: Three times a day (TID) | INTRAMUSCULAR | Status: DC
  Administered 2024-07-24: 15 [IU] via SUBCUTANEOUS
  Administered 2024-07-24: 2 [IU] via SUBCUTANEOUS
  Administered 2024-07-25 – 2024-07-26 (×3): 5 [IU] via SUBCUTANEOUS
  Filled 2024-07-24 (×2): qty 5
  Filled 2024-07-24: qty 15
  Filled 2024-07-24: qty 2
  Filled 2024-07-24: qty 5

## 2024-07-24 MED ORDER — AZITHROMYCIN 250 MG PO TABS
500.0000 mg | ORAL_TABLET | Freq: Every day | ORAL | Status: DC
  Administered 2024-07-24: 500 mg via ORAL
  Filled 2024-07-24: qty 2

## 2024-07-24 MED ORDER — INSULIN ASPART 100 UNIT/ML IJ SOLN: 3.0000 [IU] | Freq: Three times a day (TID) | INTRAMUSCULAR | Status: DC

## 2024-07-24 MED ORDER — POTASSIUM CHLORIDE CRYS ER 20 MEQ PO TBCR
40.0000 meq | EXTENDED_RELEASE_TABLET | Freq: Once | ORAL | Status: AC
  Administered 2024-07-24: 40 meq via ORAL
  Filled 2024-07-24: qty 2

## 2024-07-24 MED ORDER — INSULIN ASPART 100 UNIT/ML IJ SOLN
3.0000 [IU] | Freq: Three times a day (TID) | INTRAMUSCULAR | Status: DC
  Administered 2024-07-24 – 2024-07-26 (×3): 3 [IU] via SUBCUTANEOUS
  Filled 2024-07-24 (×3): qty 3

## 2024-07-24 NOTE — Assessment & Plan Note (Signed)
 Echocardiogram with new reduced EF of 20 to 25% with global hypokinesis, prior EF was 55 to 60%.  Cardiology is on board. Elevated proBNP. Patient is going for right and left cardiac catheterization on Monday. - Continue with IV Lasix  -Daily weight and BMP -Strict intake and output -GDMT to be added by cardiology slowly

## 2024-07-24 NOTE — Assessment & Plan Note (Signed)
 Blood glucose elevated. - Making SSI moderate -Add 3 units with meal -Continue to monitor

## 2024-07-24 NOTE — Assessment & Plan Note (Signed)
 Continue home Lipitor

## 2024-07-24 NOTE — Assessment & Plan Note (Signed)
 S/p PCI.  Mildly elevated troponin likely secondary to demand ischemia with pulmonary edema and HFrEF. No chest pain. - Continue home aspirin  and Plavix  -Continuing home statin and Imdur  -Going for right and left cardiac cath on Monday, 07/25/2024

## 2024-07-24 NOTE — Assessment & Plan Note (Signed)
 Seems like underlying CKD stage IIIb. Creatinine within baseline -Monitor renal function while she is being diuresed -Avoid nephrotoxins

## 2024-07-24 NOTE — Assessment & Plan Note (Signed)
 Some concern of pneumonia with leukocytosis and procalcitonin of 4.19. Blood cultures remain negative. - Continue with ceftriaxone  and Zithromax  to complete a 5-day course -Continue with supportive care

## 2024-07-24 NOTE — Hospital Course (Addendum)
 Partly taken from prior notes.   Sarah Payne is a pleasant 79 y.o. female with medical history significant for CAD s/p recent PCI, COPD on 2 L home oxygen, CHF, diabetes, GERD, HTN, HLD, hypothyroidism who came into ED complaining of shortness of breath that has gotten worse over 1 to 2 days.  Per EMT report patient was desaturating in 70s and was placed on 1 breather.  On presentation patient was hypoxic up to 85% on nonrebreather, she was placed on BiPAP.  Blood pressure was elevated at 204/143, tachycardic and tachypneic.  Labs with leukocytosis at 15.9,proBNP was 8644, bicarb was 20, creatinine 1.67, anion gap of 16, Troponin was 162, COVID flu and RSV negative.  Chest x-ray showed flash pulmonary edema.   Patient was given IV Lasix  and was admitted for concern of acute on chronic hypoxic respiratory failure with pulmonary edema.  Echocardiogram was obtained which shows a new diagnosis of HFrEF with EF of 20 to 25%, prior EF of 55%.  Cardiology was consulted and they were recommending continuation of IV diuresis, will get right and left cardiac cath on Monday.  Patient also received antibiotics for concern of pneumonia with leukocytosis and procalcitonin of 4.19.  Although 2 L of home oxygen was mentioned everywhere, per patient she was not on any oxygen at home.  She was able to wean back to room air.  1/4: Hemodynamically stable, now on room air.  Mild hypokalemia which is being repleted, stable renal function, net negative of about 4 L.  1/5: Remained hemodynamically stable, cardiac cath today with no ischemic reason for new diagnosis of HFrEF.  Did had patent prior stents and subtotally occluded vessels with left-to-right collaterals which were unchanged. Cardiology is going to start low-dose Entresto  and will introduce p.o. diuretic from tomorrow.  1/6: Patient remained hemodynamically stable, improving creatinine to 1.53 which is around her baseline.  Patient was started on p.o.  Lasix , cardiology made some more changes to add GDMT to her medications which they will continue as outpatient.  Patient is being discharged on current medications and need to have a close follow-up with her providers for further assistance.

## 2024-07-24 NOTE — Assessment & Plan Note (Signed)
 Continue home amlodipine . Patient is also getting IV Lasix . - Cardiology to introduce GDMT slowly

## 2024-07-24 NOTE — Plan of Care (Signed)
" °  Problem: Coping: Goal: Ability to adjust to condition or change in health will improve Outcome: Progressing   Problem: Clinical Measurements: Goal: Respiratory complications will improve Outcome: Progressing Goal: Cardiovascular complication will be avoided Outcome: Progressing   Problem: Activity: Goal: Risk for activity intolerance will decrease Outcome: Progressing   Problem: Safety: Goal: Ability to remain free from injury will improve Outcome: Progressing   "

## 2024-07-24 NOTE — Plan of Care (Signed)
" °  Problem: Education: Goal: Ability to describe self-care measures that may prevent or decrease complications (Diabetes Survival Skills Education) will improve Outcome: Progressing   Problem: Coping: Goal: Ability to adjust to condition or change in health will improve Outcome: Progressing   Problem: Fluid Volume: Goal: Ability to maintain a balanced intake and output will improve Outcome: Progressing   Problem: Health Behavior/Discharge Planning: Goal: Ability to identify and utilize available resources and services will improve Outcome: Progressing Goal: Ability to manage health-related needs will improve Outcome: Progressing   Problem: Metabolic: Goal: Ability to maintain appropriate glucose levels will improve Outcome: Progressing   Problem: Nutritional: Goal: Maintenance of adequate nutrition will improve Outcome: Progressing Goal: Progress toward achieving an optimal weight will improve Outcome: Progressing   Problem: Skin Integrity: Goal: Risk for impaired skin integrity will decrease Outcome: Progressing   Problem: Tissue Perfusion: Goal: Adequacy of tissue perfusion will improve Outcome: Progressing   Problem: Education: Goal: Ability to demonstrate management of disease process will improve Outcome: Progressing Goal: Ability to verbalize understanding of medication therapies will improve Outcome: Progressing Goal: Individualized Educational Video(s) Outcome: Progressing   Problem: Activity: Goal: Capacity to carry out activities will improve Outcome: Progressing   Problem: Cardiac: Goal: Ability to achieve and maintain adequate cardiopulmonary perfusion will improve Outcome: Progressing   Problem: Education: Goal: Knowledge of General Education information will improve Description: Including pain rating scale, medication(s)/side effects and non-pharmacologic comfort measures Outcome: Progressing   Problem: Health Behavior/Discharge Planning: Goal:  Ability to manage health-related needs will improve Outcome: Progressing   Problem: Clinical Measurements: Goal: Ability to maintain clinical measurements within normal limits will improve Outcome: Progressing Goal: Will remain free from infection Outcome: Progressing Goal: Diagnostic test results will improve Outcome: Progressing Goal: Respiratory complications will improve Outcome: Progressing Goal: Cardiovascular complication will be avoided Outcome: Progressing   Problem: Activity: Goal: Risk for activity intolerance will decrease Outcome: Progressing   Problem: Nutrition: Goal: Adequate nutrition will be maintained Outcome: Progressing   Problem: Coping: Goal: Level of anxiety will decrease Outcome: Progressing   Problem: Elimination: Goal: Will not experience complications related to bowel motility Outcome: Progressing Goal: Will not experience complications related to urinary retention Outcome: Progressing   Problem: Pain Managment: Goal: General experience of comfort will improve and/or be controlled Outcome: Progressing   Problem: Safety: Goal: Ability to remain free from injury will improve Outcome: Progressing   Problem: Skin Integrity: Goal: Risk for impaired skin integrity will decrease Outcome: Progressing   "

## 2024-07-24 NOTE — Progress Notes (Signed)
 "  Rounding Note   Patient Name: Sarah Payne Date of Encounter: 07/24/2024  [facility] Cardiologist: Evalene Lunger, MD   Subjective Patient seen on a.m. rounds.  States that breathing has improved.    She has -1.7 L output in the last 24 hours.  Patient had order placed for pure wick yesterday unfortunately was not placed.  She states that she did not have a good night and did not sleep.  Oxygen was able to be weaned down from 6 L to 4 L.  Her primary nurse for today was in the room and all issues were discussed with plan for resolution.  Scheduled Meds:  amLODipine   10 mg Oral Daily   aspirin  EC  81 mg Oral Daily   atorvastatin   80 mg Oral Daily   clopidogrel   75 mg Oral Daily   ezetimibe   10 mg Oral Daily   feeding supplement (GLUCERNA SHAKE)  237 mL Oral TID BM   furosemide   60 mg Intravenous BID   insulin  aspart  0-5 Units Subcutaneous QHS   insulin  aspart  0-9 Units Subcutaneous TID WC   isosorbide  mononitrate  30 mg Oral Daily   levothyroxine   100 mcg Oral Q0600   metoprolol  succinate  50 mg Oral Daily   potassium chloride   20 mEq Oral Daily   umeclidinium-vilanterol  1 puff Inhalation Daily   Continuous Infusions:  azithromycin  500 mg (07/23/24 1759)   cefTRIAXone  (ROCEPHIN )  IV 2 g (07/23/24 1713)   heparin  1,100 Units/hr (07/24/24 0400)   nitroGLYCERIN  15 mcg/min (07/24/24 0400)   PRN Meds: acetaminophen  **OR** acetaminophen , hydrALAZINE , ipratropium-albuterol , mirtazapine , morphine  injection, nitroGLYCERIN , ondansetron  **OR** ondansetron  (ZOFRAN ) IV, oxyCODONE    Vital Signs  Vitals:   07/23/24 1935 07/23/24 2315 07/24/24 0445 07/24/24 0500  BP: (!) 104/53 120/65 119/85   Pulse: 64 65 70   Resp: 20 (!) 21 18   Temp: 98.9 F (37.2 C) 98.6 F (37 C) 98.5 F (36.9 C)   TempSrc:   Oral   SpO2: 98% 100% 97%   Weight:    59.2 kg  Height:        Intake/Output Summary (Last 24 hours) at 07/24/2024 0746 Last data filed at 07/24/2024 0400 Gross per 24 hour   Intake 1066.87 ml  Output 2850 ml  Net -1783.13 ml      07/24/2024    5:00 AM 07/23/2024    5:00 AM 07/22/2024    5:00 AM  Last 3 Weights  Weight (lbs) 130 lb 8.2 oz 132 lb 7.9 oz 132 lb 11.5 oz  Weight (kg) 59.2 kg 60.1 kg 60.2 kg      Telemetry Sinus rhythm with a left bundle branch block and unifocal PVCs- Personally Reviewed  ECG  No new tracings- Personally Reviewed  Physical Exam  GEN: No acute distress.   Neck: + JVD Cardiac: RRR, no murmurs, rubs, or gallops.  Respiratory: Coarse throughout from the bases to mid-lung to auscultation bilaterally.  Respirations are unlabored at rest on 4 L of O2 via nasal cannula (patient is not on oxygen at home) GI: Soft, nontender, non-distended  MS: No edema; No deformity. Neuro:  Nonfocal  Psych: Normal affect   Labs High Sensitivity Troponin:  No results for input(s): TROPONINIHS in the last 720 hours.  Recent Labs  Lab 07/21/24 0441 07/21/24 1259 07/21/24 1519 07/22/24 1355 07/23/24 1204  TRNPT 134* 662* 795* 3,076* 2,880*       Chemistry Recent Labs  Lab 07/21/24 0243 07/22/24  9651 07/23/24 0226  NA 138 136 137  K 4.3 3.8 3.4*  CL 102 97* 97*  CO2 20* 24 28  GLUCOSE 296* 226* 175*  BUN 19 22 25*  CREATININE 1.67* 1.47* 1.58*  CALCIUM  9.7 9.0 8.6*  MG  --  2.0  --   PROT 7.6 6.2*  --   ALBUMIN 3.9 3.4*  --   AST 24 136*  --   ALT 14 19  --   ALKPHOS 128* 85  --   BILITOT 0.3 0.4  --   GFRNONAA 31* 37* 34*  ANIONGAP 16* 15 12    Lipids No results for input(s): CHOL, TRIG, HDL, LABVLDL, LDLCALC, CHOLHDL in the last 168 hours.  Hematology Recent Labs  Lab 07/22/24 0348 07/23/24 0230 07/24/24 0447  WBC 17.3* 11.3* 7.8  RBC 3.92 3.49* 3.51*  HGB 11.5* 10.3* 10.4*  HCT 35.5* 31.6* 31.5*  MCV 90.6 90.5 89.7  MCH 29.3 29.5 29.6  MCHC 32.4 32.6 33.0  RDW 14.1 14.1 13.9  PLT 326 296 283   Thyroid  No results for input(s): TSH, FREET4 in the last 168 hours.  BNP Recent Labs  Lab  07/21/24 0243  PROBNP 8,644.0*    DDimer No results for input(s): DDIMER in the last 168 hours.   Radiology    Cardiac Studies 2d echo 07/22/2023 1. Left ventricular ejection fraction, by estimation, is 25 to 30%. The  left ventricle has severely decreased function. The left ventricle  demonstrates global hypokinesis. Left ventricular diastolic parameters are  consistent with Grade I diastolic  dysfunction (impaired relaxation). The average left ventricular global  longitudinal strain is -7.5 %. The global longitudinal strain is abnormal.   2. Right ventricular systolic function is normal. The right ventricular  size is normal.   3. The mitral valve is normal in structure. Mild to moderate mitral valve  regurgitation. No evidence of mitral stenosis.   4. The aortic valve is normal in structure. Aortic valve regurgitation is  not visualized. Aortic valve sclerosis is present, with no evidence of  aortic valve stenosis.   5. The inferior vena cava is normal in size with greater than 50%  respiratory variability, suggesting right atrial pressure of 3 mmHg.   Patient Profile   79 y.o. female with a past medical history significant for coronary artery disease with prior stent x 3 placed to the mid left circumflex and moderate to severe LAD disease with a last heart catheterization 06/21/2024 with stent placement to the proximal LAD, depression/anxiety, hypertension, smoking history, COPD, poorly controlled diabetes with hemoglobin A1c of 10, who is being seen and evaluated for progressive worsening shortness of breath and chest discomfort.  Assessment & Plan  Acute on chronic respiratory failure with hypoxia -Long smoking history  -Recent viral syndrome concerning for flu -Currently maintaining oxygen saturations on 4 L of O2 via nasal cannula, of note patient has not required oxygen at home -Titrate FiO2 to maintain oxygen saturations of greater than equal to 92% -Previously required  BiPAP in the emergency department -Respiratory panel negative for flu, RSV, COVID -Chest x-ray revealing moderate to severe pulmonary edema more severe within the right lung, mild cardiomegaly, stable thoracic spinal stimulator device, and left shoulder arthroplasty, concerning for CAP, ongoing antibiotic management per IM -Supportive care  Acute HFrEF -Patient presented with progressive worsening shortness of breath to the emergency department -Chest x-ray consistent with pulmonary edema -ProBNP 8644 -Continued on furosemide  60 mg IV twice daily -Continued on Toprol -XL -Echocardiogram revealed  significant drop in LVEF of 25-30% -Previous echo revealed LVEF 55-60% - -1.7L output in the last 24 hours -Heart failure education -Asked nursing to replace pure wick to help with voiding, with patient on IV furosemide  and to high risk medication drips it would be best for her to have minimal activity to prevent escalation of chest pain -Daily weights and I's and O's -Escalate GDMT as tolerated by kidney function -Will need further ischemic evaluation with significant drop in LVEF, tentatively scheduled for right and left heart catheterization today but continues to appear to be volume up would likely benefit from procedure Monday and to continue to be diuresed over the weekend  Coronary artery disease with stable angina -Recent stent placement to the LAD earlier in December 2025 - Nitro drip currently infusing at 15 mics per minute continues to remain chest pain free on exam, discussed weaning nitroglycerin  and increasing Imdur  if needed -Reports she has been chest pain-free this morning -High-sensitivity troponins peaked at 3,076 -Continued on heparin  infusion -Continued on aspirin  81 mg daily, atorvastatin  80 mg daily, clopidogrel  75 mg daily, ezetimibe  10 mg daily, Imdur  30 mg daily, -Will need further ischemic evaluation with drop in LVEF once breathing has improved -Continue on telemetry  monitoring -EKG as needed for pain or changes  Hypertension -Presented with hypertensive urgency -Nitroglycerin  drip currently at 15 mcg/min -BP 138/75 -Continued on current medication regimen -Vital signs per unit protocol  Mild hypokalemia -Potassium 3.4 -Potassium supplementation ordered -Monitor/trend/replete electrolytes as needed -Daily BMP  Hyperlipidemia -Last LDL 44 -Continue on atorvastatin  80, ezetimibe  10 mg daily, PTA PCSK9 inhibitor on hold for hospitalization  Chronic kidney disease stage 3B -Serum creatinine 1.57 -Baseline serum creatinine around 1.3 -has remained stable -Daily BMP while on IV diuretic therapy monitoring kidney function closely -Monitor urine output -Monitor/trend/replete electrolytes -Avoid nephrotoxic agents were able  Longstanding history of COPD not currently in exacerbation and a former smoker -Continued on inhalers -Has DuoNebs  Peripheral arterial disease/carotid artery stenosis -Patient has previously had intervention to the left SFA most recent ABI in 09/2022 was stable -Carotid ultrasound completed 3/24 showed 1-39% bilateral ICA stenosis -Continued on DAPT and statin therapy  Type 2 diabetes -Continued on sliding scale insulin  -Poorly controlled with A1c of 10 -Ongoing management per IM  Intermittent left bundle branch block -Chronic finding on EKGs - Noted on telemetry monitoring - Continue with telemetry monitoring     For questions or updates, please contact [facility] Please consult [email or web address] for contact info under       Signed, Reese Senk, NP  07/24/2024, 7:46 AM    "

## 2024-07-24 NOTE — H&P (View-Only) (Signed)
 "  Rounding Note   Patient Name: Sarah Payne Date of Encounter: 07/24/2024  [facility] Cardiologist: Evalene Lunger, MD   Subjective Patient seen on a.m. rounds.  States that breathing has improved.    She has -1.7 L output in the last 24 hours.  Patient had order placed for pure wick yesterday unfortunately was not placed.  She states that she did not have a good night and did not sleep.  Oxygen was able to be weaned down from 6 L to 4 L.  Her primary nurse for today was in the room and all issues were discussed with plan for resolution.  Scheduled Meds:  amLODipine   10 mg Oral Daily   aspirin  EC  81 mg Oral Daily   atorvastatin   80 mg Oral Daily   clopidogrel   75 mg Oral Daily   ezetimibe   10 mg Oral Daily   feeding supplement (GLUCERNA SHAKE)  237 mL Oral TID BM   furosemide   60 mg Intravenous BID   insulin  aspart  0-5 Units Subcutaneous QHS   insulin  aspart  0-9 Units Subcutaneous TID WC   isosorbide  mononitrate  30 mg Oral Daily   levothyroxine   100 mcg Oral Q0600   metoprolol  succinate  50 mg Oral Daily   potassium chloride   20 mEq Oral Daily   umeclidinium-vilanterol  1 puff Inhalation Daily   Continuous Infusions:  azithromycin  500 mg (07/23/24 1759)   cefTRIAXone  (ROCEPHIN )  IV 2 g (07/23/24 1713)   heparin  1,100 Units/hr (07/24/24 0400)   nitroGLYCERIN  15 mcg/min (07/24/24 0400)   PRN Meds: acetaminophen  **OR** acetaminophen , hydrALAZINE , ipratropium-albuterol , mirtazapine , morphine  injection, nitroGLYCERIN , ondansetron  **OR** ondansetron  (ZOFRAN ) IV, oxyCODONE    Vital Signs  Vitals:   07/23/24 1935 07/23/24 2315 07/24/24 0445 07/24/24 0500  BP: (!) 104/53 120/65 119/85   Pulse: 64 65 70   Resp: 20 (!) 21 18   Temp: 98.9 F (37.2 C) 98.6 F (37 C) 98.5 F (36.9 C)   TempSrc:   Oral   SpO2: 98% 100% 97%   Weight:    59.2 kg  Height:        Intake/Output Summary (Last 24 hours) at 07/24/2024 0746 Last data filed at 07/24/2024 0400 Gross per 24 hour   Intake 1066.87 ml  Output 2850 ml  Net -1783.13 ml      07/24/2024    5:00 AM 07/23/2024    5:00 AM 07/22/2024    5:00 AM  Last 3 Weights  Weight (lbs) 130 lb 8.2 oz 132 lb 7.9 oz 132 lb 11.5 oz  Weight (kg) 59.2 kg 60.1 kg 60.2 kg      Telemetry Sinus rhythm with a left bundle branch block and unifocal PVCs- Personally Reviewed  ECG  No new tracings- Personally Reviewed  Physical Exam  GEN: No acute distress.   Neck: + JVD Cardiac: RRR, no murmurs, rubs, or gallops.  Respiratory: Coarse throughout from the bases to mid-lung to auscultation bilaterally.  Respirations are unlabored at rest on 4 L of O2 via nasal cannula (patient is not on oxygen at home) GI: Soft, nontender, non-distended  MS: No edema; No deformity. Neuro:  Nonfocal  Psych: Normal affect   Labs High Sensitivity Troponin:  No results for input(s): TROPONINIHS in the last 720 hours.  Recent Labs  Lab 07/21/24 0441 07/21/24 1259 07/21/24 1519 07/22/24 1355 07/23/24 1204  TRNPT 134* 662* 795* 3,076* 2,880*       Chemistry Recent Labs  Lab 07/21/24 0243 07/22/24  9651 07/23/24 0226  NA 138 136 137  K 4.3 3.8 3.4*  CL 102 97* 97*  CO2 20* 24 28  GLUCOSE 296* 226* 175*  BUN 19 22 25*  CREATININE 1.67* 1.47* 1.58*  CALCIUM  9.7 9.0 8.6*  MG  --  2.0  --   PROT 7.6 6.2*  --   ALBUMIN 3.9 3.4*  --   AST 24 136*  --   ALT 14 19  --   ALKPHOS 128* 85  --   BILITOT 0.3 0.4  --   GFRNONAA 31* 37* 34*  ANIONGAP 16* 15 12    Lipids No results for input(s): CHOL, TRIG, HDL, LABVLDL, LDLCALC, CHOLHDL in the last 168 hours.  Hematology Recent Labs  Lab 07/22/24 0348 07/23/24 0230 07/24/24 0447  WBC 17.3* 11.3* 7.8  RBC 3.92 3.49* 3.51*  HGB 11.5* 10.3* 10.4*  HCT 35.5* 31.6* 31.5*  MCV 90.6 90.5 89.7  MCH 29.3 29.5 29.6  MCHC 32.4 32.6 33.0  RDW 14.1 14.1 13.9  PLT 326 296 283   Thyroid  No results for input(s): TSH, FREET4 in the last 168 hours.  BNP Recent Labs  Lab  07/21/24 0243  PROBNP 8,644.0*    DDimer No results for input(s): DDIMER in the last 168 hours.   Radiology    Cardiac Studies 2d echo 07/22/2023 1. Left ventricular ejection fraction, by estimation, is 25 to 30%. The  left ventricle has severely decreased function. The left ventricle  demonstrates global hypokinesis. Left ventricular diastolic parameters are  consistent with Grade I diastolic  dysfunction (impaired relaxation). The average left ventricular global  longitudinal strain is -7.5 %. The global longitudinal strain is abnormal.   2. Right ventricular systolic function is normal. The right ventricular  size is normal.   3. The mitral valve is normal in structure. Mild to moderate mitral valve  regurgitation. No evidence of mitral stenosis.   4. The aortic valve is normal in structure. Aortic valve regurgitation is  not visualized. Aortic valve sclerosis is present, with no evidence of  aortic valve stenosis.   5. The inferior vena cava is normal in size with greater than 50%  respiratory variability, suggesting right atrial pressure of 3 mmHg.   Patient Profile   79 y.o. female with a past medical history significant for coronary artery disease with prior stent x 3 placed to the mid left circumflex and moderate to severe LAD disease with a last heart catheterization 06/21/2024 with stent placement to the proximal LAD, depression/anxiety, hypertension, smoking history, COPD, poorly controlled diabetes with hemoglobin A1c of 10, who is being seen and evaluated for progressive worsening shortness of breath and chest discomfort.  Assessment & Plan  Acute on chronic respiratory failure with hypoxia -Long smoking history  -Recent viral syndrome concerning for flu -Currently maintaining oxygen saturations on 4 L of O2 via nasal cannula, of note patient has not required oxygen at home -Titrate FiO2 to maintain oxygen saturations of greater than equal to 92% -Previously required  BiPAP in the emergency department -Respiratory panel negative for flu, RSV, COVID -Chest x-ray revealing moderate to severe pulmonary edema more severe within the right lung, mild cardiomegaly, stable thoracic spinal stimulator device, and left shoulder arthroplasty, concerning for CAP, ongoing antibiotic management per IM -Supportive care  Acute HFrEF -Patient presented with progressive worsening shortness of breath to the emergency department -Chest x-ray consistent with pulmonary edema -ProBNP 8644 -Continued on furosemide  60 mg IV twice daily -Continued on Toprol -XL -Echocardiogram revealed  significant drop in LVEF of 25-30% -Previous echo revealed LVEF 55-60% - -1.7L output in the last 24 hours -Heart failure education -Asked nursing to replace pure wick to help with voiding, with patient on IV furosemide  and to high risk medication drips it would be best for her to have minimal activity to prevent escalation of chest pain -Daily weights and I's and O's -Escalate GDMT as tolerated by kidney function -Will need further ischemic evaluation with significant drop in LVEF, tentatively scheduled for right and left heart catheterization today but continues to appear to be volume up would likely benefit from procedure Monday and to continue to be diuresed over the weekend  Coronary artery disease with stable angina -Recent stent placement to the LAD earlier in December 2025 - Nitro drip currently infusing at 15 mics per minute continues to remain chest pain free on exam, discussed weaning nitroglycerin  and increasing Imdur  if needed -Reports she has been chest pain-free this morning -High-sensitivity troponins peaked at 3,076 -Continued on heparin  infusion -Continued on aspirin  81 mg daily, atorvastatin  80 mg daily, clopidogrel  75 mg daily, ezetimibe  10 mg daily, Imdur  30 mg daily, -Will need further ischemic evaluation with drop in LVEF once breathing has improved -Continue on telemetry  monitoring -EKG as needed for pain or changes  Hypertension -Presented with hypertensive urgency -Nitroglycerin  drip currently at 15 mcg/min -BP 138/75 -Continued on current medication regimen -Vital signs per unit protocol  Mild hypokalemia -Potassium 3.4 -Potassium supplementation ordered -Monitor/trend/replete electrolytes as needed -Daily BMP  Hyperlipidemia -Last LDL 44 -Continue on atorvastatin  80, ezetimibe  10 mg daily, PTA PCSK9 inhibitor on hold for hospitalization  Chronic kidney disease stage 3B -Serum creatinine 1.57 -Baseline serum creatinine around 1.3 -has remained stable -Daily BMP while on IV diuretic therapy monitoring kidney function closely -Monitor urine output -Monitor/trend/replete electrolytes -Avoid nephrotoxic agents were able  Longstanding history of COPD not currently in exacerbation and a former smoker -Continued on inhalers -Has DuoNebs  Peripheral arterial disease/carotid artery stenosis -Patient has previously had intervention to the left SFA most recent ABI in 09/2022 was stable -Carotid ultrasound completed 3/24 showed 1-39% bilateral ICA stenosis -Continued on DAPT and statin therapy  Type 2 diabetes -Continued on sliding scale insulin  -Poorly controlled with A1c of 10 -Ongoing management per IM  Intermittent left bundle branch block -Chronic finding on EKGs - Noted on telemetry monitoring - Continue with telemetry monitoring     For questions or updates, please contact [facility] Please consult [email or web address] for contact info under       Signed, Reese Senk, NP  07/24/2024, 7:46 AM    "

## 2024-07-24 NOTE — Progress Notes (Signed)
 PHARMACY - ANTICOAGULATION CONSULT NOTE  Pharmacy Consult for [facility] Indication: chest pain/ACS  Allergies[1]  Patient Measurements: Height: 5' 6 (167.6 cm) Weight: 59.2 kg (130 lb 8.2 oz) IBW/kg (Calculated) : 59.3 HEPARIN  DW (KG): 54.3  Labs: Recent Labs    07/21/24 1417 07/21/24 2249 07/22/24 0348 07/22/24 0640 07/23/24 0226 07/23/24 0230 07/23/24 1204 07/23/24 1957 07/24/24 0447  HGB  --    < > 11.5*  --   --  10.3*  --   --  10.4*  HCT  --   --  35.5*  --   --  31.6*  --   --  31.5*  PLT  --   --  326  --   --  296  --   --  283  APTT 36  --   --   --   --   --   --   --   --   LABPROT 13.5  --   --   --   --   --   --   --   --   INR 1.0  --   --   --   --   --   --   --   --   HEPARINUNFRC  --    < >  --    < >  --  0.27* 0.35 0.29* 0.43  CREATININE  --   --  1.47*  --  1.58*  --   --   --   --    < > = values in this interval not displayed.    Estimated Creatinine Clearance: 28.3 mL/min (A) (by C-G formula based on SCr of 1.58 mg/dL (H)).   Medical History: Past Medical History:  Diagnosis Date   Acute pain of left shoulder 01/17/2019   Aortic atherosclerosis    CHF (congestive heart failure) (HCC) 09/16/2013   a.) TTE 09/16/2013: EF 55-60%; mod inferior HK, triv TR; G1DD. b.) TTE 01/02/2016: EF 25-30%; mid-apicalateroseptal, lat, inf, inferoseptal, apical akinesis; triv TR; G1DD. c. TTE 04/16/2016: EF 65%; G1DD. d.) TTE 05/02/2020: EF 50-55%; G2DD. e.) TTE 04/11/2021: EF 55%; G1DD.   Chronic low back pain with bilateral sciatica    Chronic painful diabetic neuropathy (HCC)    Chronic radicular lumbar pain    CKD (chronic kidney disease), stage III (HCC)    Clostridioides difficile infection 2015   COPD (chronic obstructive pulmonary disease) (HCC)    Coronary artery disease    a.) PCI 02/06/2011: 100% mLCx - DES x 3. b.) PCI 09/17/2011: 95% dRCA - DES x 1. c.) PCI 10/02/2011: 75% pLCX - DES x 1. d.) PCI 08/19/2012: DES x 2 p-mRCA.  e.) LHC 01/01/16:  Takotsubo event 01/01/2016 with patent stents   Depression    Diverticulosis    Failed back syndrome    Frequent PVCs    a. Noted in hospital 12/2015.   GERD (gastroesophageal reflux disease)    History of bilateral cataract extraction 01/2015   History of heart artery stent    a.) TOTAL of 7 stents --> 02/06/2011 - overlapping 2.5 x 12 mm Xience V, 2.5 x 8 mm Xience V, 2.25 x 8 mm Xience Nano to mLCx; 09/17/2011 - 2.5 x 23 Xience V dRCA; 10/02/2011 - 2.5 x 15 mm Xience V pLCx; 08/09/2012 - overlapping 3.0 x 38mm mRCA and 3.5 x 23mm pRCA   Hyperlipidemia    Hypertension    Hypothyroidism    Impingement syndrome of left shoulder  LBBB (left bundle branch block)    Long term current use of clopidogrel     Marijuana abuse    Myocardial infarction [facility])    a.) multiple MIs;  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 12/2015 - nephew committed suicide 1 week prior, sister died the morning of presentation - initially called a STEMI; cath with patent stents. LVEF 25-30%.   Tendonitis of left rotator cuff    Tobacco abuse    Vaginal burning 03/22/2019   Vascular dementia    Assessment: 79 y/o F with a known h/o CAD and recent stent to LAD this month admitted with CP. Patient received Lovenox  this morning.   1/2@0640 : HL 0.27, subtherapeutic  1/2@1646 : HL 0.16, subtherepeutic 1/3@0230 : HL 0.27, SUBtherapeutic  1/3@1204 : HL 0.35, therapeutic x 1 1/3@1957 : HL 0.29, SUBtherapeutic  1/4@0447 : HL 0.43, therapeutic X 1  Goal of Therapy:  Heparin  level 0.3-0.7  units/ml Monitor platelets by anticoagulation protocol: Yes   Plan:  1/4: HL @ 0447 = 0.43, therapeutic X 1 - will continue pt on current rate and recheck HL in 8 hrs  - Daily CBC per protocol while on IV heparin   Tyrihanna Wingert D 07/24/2024 6:20 AM      [1] No Known Allergies

## 2024-07-24 NOTE — Progress Notes (Signed)
 PHARMACY - ANTICOAGULATION CONSULT NOTE  Pharmacy Consult for [facility] Indication: chest pain/ACS  Allergies[1]  Patient Measurements: Height: 5' 6 (167.6 cm) Weight: 59.2 kg (130 lb 8.2 oz) IBW/kg (Calculated) : 59.3 HEPARIN  DW (KG): 54.3  Labs: Recent Labs    07/21/24 1417 07/21/24 2249 07/22/24 0348 07/22/24 0640 07/23/24 0226 07/23/24 0230 07/23/24 1204 07/23/24 1957 07/24/24 0447 07/24/24 1248  HGB  --    < > 11.5*  --   --  10.3*  --   --  10.4*  --   HCT  --   --  35.5*  --   --  31.6*  --   --  31.5*  --   PLT  --   --  326  --   --  296  --   --  283  --   APTT 36  --   --   --   --   --   --   --   --   --   LABPROT 13.5  --   --   --   --   --   --   --   --   --   INR 1.0  --   --   --   --   --   --   --   --   --   HEPARINUNFRC  --    < >  --    < >  --  0.27*   < > 0.29* 0.43 0.35  CREATININE  --   --  1.47*  --  1.58*  --   --   --  1.57*  --    < > = values in this interval not displayed.    Estimated Creatinine Clearance: 28.5 mL/min (A) (by C-G formula based on SCr of 1.57 mg/dL (H)).   Medical History: Past Medical History:  Diagnosis Date   Acute pain of left shoulder 01/17/2019   Aortic atherosclerosis    CHF (congestive heart failure) (HCC) 09/16/2013   a.) TTE 09/16/2013: EF 55-60%; mod inferior HK, triv TR; G1DD. b.) TTE 01/02/2016: EF 25-30%; mid-apicalateroseptal, lat, inf, inferoseptal, apical akinesis; triv TR; G1DD. c. TTE 04/16/2016: EF 65%; G1DD. d.) TTE 05/02/2020: EF 50-55%; G2DD. e.) TTE 04/11/2021: EF 55%; G1DD.   Chronic low back pain with bilateral sciatica    Chronic painful diabetic neuropathy (HCC)    Chronic radicular lumbar pain    CKD (chronic kidney disease), stage III (HCC)    Clostridioides difficile infection 2015   COPD (chronic obstructive pulmonary disease) (HCC)    Coronary artery disease    a.) PCI 02/06/2011: 100% mLCx - DES x 3. b.) PCI 09/17/2011: 95% dRCA - DES x 1. c.) PCI 10/02/2011: 75% pLCX - DES x 1. d.)  PCI 08/19/2012: DES x 2 p-mRCA.  e.) LHC 01/01/16: Takotsubo event 01/01/2016 with patent stents   Depression    Diverticulosis    Failed back syndrome    Frequent PVCs    a. Noted in hospital 12/2015.   GERD (gastroesophageal reflux disease)    History of bilateral cataract extraction 01/2015   History of heart artery stent    a.) TOTAL of 7 stents --> 02/06/2011 - overlapping 2.5 x 12 mm Xience V, 2.5 x 8 mm Xience V, 2.25 x 8 mm Xience Nano to mLCx; 09/17/2011 - 2.5 x 23 Xience V dRCA; 10/02/2011 - 2.5 x 15 mm Xience V pLCx; 08/09/2012 - overlapping 3.0 x 38mm mRCA  and 3.5 x 23mm pRCA   Hyperlipidemia    Hypertension    Hypothyroidism    Impingement syndrome of left shoulder    LBBB (left bundle branch block)    Long term current use of clopidogrel     Marijuana abuse    Myocardial infarction [facility])    a.) multiple MIs;  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 12/2015 - nephew committed suicide 1 week prior, sister died the morning of presentation - initially called a STEMI; cath with patent stents. LVEF 25-30%.   Tendonitis of left rotator cuff    Tobacco abuse    Vaginal burning 03/22/2019   Vascular dementia    Assessment: 79 y/o F with a known h/o CAD and recent stent to LAD this month admitted with CP. Patient received Lovenox  this morning.   1/2@0640 : HL 0.27, subtherapeutic  1/2@1646 : HL 0.16, subtherepeutic 1/3@0230 : HL 0.27, SUBtherapeutic  1/3@1204 : HL 0.35, therapeutic x 1 1/3@1957 : HL 0.29, SUBtherapeutic  1/4@0447 : HL 0.43, therapeutic x  1 1/4@1248 : HL 0.35, therapeutic x 2  Goal of Therapy:  Heparin  level 0.3-0.7 units/ml Monitor platelets by anticoagulation protocol: Yes   Plan:  --Heparin  level is therapeutic x 2 --Continue heparin  infusion at 1100 units/hr --Re-check HL and CBC tomorrow AM  Marolyn KATHEE Mare 07/24/2024 1:35 PM    [1] No Known Allergies

## 2024-07-24 NOTE — Assessment & Plan Note (Signed)
 Initially required BiPAP and nonrebreather, likely secondary to pulmonary edema with acute HFrEF.  Now on room air.  Home use of 2 L mentioned in her chart but according to patient she was not using any oxygen at home.  - Continue to monitor and use oxygen if needed

## 2024-07-24 NOTE — Assessment & Plan Note (Signed)
 No wheezing today. -As needed bronchodilator

## 2024-07-24 NOTE — Assessment & Plan Note (Signed)
 Continue home Synthroid

## 2024-07-24 NOTE — Progress Notes (Signed)
 " Progress Note   Patient: Sarah Payne FMW:[id number] DOB: May 09, 1946 DOA: 07/21/2024     3 DOS: the patient was seen and examined on 07/24/2024   Brief hospital course: Partly taken from prior notes.   Sarah Payne is a pleasant 79 y.o. female with medical history significant for CAD s/p recent PCI, COPD on 2 L home oxygen, CHF, diabetes, GERD, HTN, HLD, hypothyroidism who came into ED complaining of shortness of breath that has gotten worse over 1 to 2 days.  Per EMT report patient was desaturating in 70s and was placed on 1 breather.  On presentation patient was hypoxic up to 85% on nonrebreather, she was placed on BiPAP.  Blood pressure was elevated at 204/143, tachycardic and tachypneic.  Labs with leukocytosis at 15.9,proBNP was 8644, bicarb was 20, creatinine 1.67, anion gap of 16, Troponin was 162, COVID flu and RSV negative.  Chest x-ray showed flash pulmonary edema.   Patient was given IV Lasix  and was admitted for concern of acute on chronic hypoxic respiratory failure with pulmonary edema.  Echocardiogram was obtained which shows a new diagnosis of HFrEF with EF of 20 to 25%, prior EF of 55%.  Cardiology was consulted and they were recommending continuation of IV diuresis, will get right and left cardiac cath on Monday.  Patient also received antibiotics for concern of pneumonia with leukocytosis and procalcitonin of 4.19.  Although 2 L of home oxygen was mentioned everywhere, per patient she was not on any oxygen at home.  She was able to wean back to room air.  1/4: Hemodynamically stable, now on room air.  Mild hypokalemia which is being repleted, stable renal function, net negative of about 4 L.  Assessment and Plan: * Acute respiratory failure with hypoxia (HCC) Initially required BiPAP and nonrebreather, likely secondary to pulmonary edema with acute HFrEF.  Now on room air.  Home use of 2 L mentioned in her chart but according to patient she was not using any oxygen at  home.  - Continue to monitor and use oxygen if needed  Acute on chronic systolic CHF (congestive heart failure) (HCC) Echocardiogram with new reduced EF of 20 to 25% with global hypokinesis, prior EF was 55 to 60%.  Cardiology is on board. Elevated proBNP. Patient is going for right and left cardiac catheterization on Monday. - Continue with IV Lasix  -Daily weight and BMP -Strict intake and output -GDMT to be added by cardiology slowly  CAP (community acquired pneumonia) Some concern of pneumonia with leukocytosis and procalcitonin of 4.19. Blood cultures remain negative. - Continue with ceftriaxone  and Zithromax  to complete a 5-day course -Continue with supportive care  Uncontrolled type 2 diabetes mellitus with hypoglycemia, with long-term current use of insulin  (HCC) Blood glucose elevated. - Making SSI moderate -Add 3 units with meal -Continue to monitor  History of CAD (coronary artery disease) S/p PCI.  Mildly elevated troponin likely secondary to demand ischemia with pulmonary edema and HFrEF. No chest pain. - Continue home aspirin  and Plavix  -Continuing home statin and Imdur  -Going for right and left cardiac cath on Monday, 07/25/2024  COPD (chronic obstructive pulmonary disease) (HCC) No wheezing today. -As needed bronchodilator  CKD (chronic kidney disease), stage III (HCC) Seems like underlying CKD stage IIIb. Creatinine within baseline -Monitor renal function while she is being diuresed -Avoid nephrotoxins  Hyperlipidemia LDL goal <70 - Continue home Lipitor   Hypertension Continue home amlodipine . Patient is also getting IV Lasix . - Cardiology to introduce GDMT slowly  Hypothyroidism - Continue home Synthroid    Subjective: Patient was seen and examined today.  Patient denies any home use of oxygen.  No chest pain or shortness of breath.  Physical Exam: Vitals:   07/24/24 0445 07/24/24 0500 07/24/24 0815 07/24/24 1314  BP: 119/85  138/75 139/73   Pulse: 70  67 66  Resp: 18   19  Temp: 98.5 F (36.9 C)  98.4 F (36.9 C) 97.8 F (36.6 C)  TempSrc: Oral  Oral Axillary  SpO2: 97%  97% 99%  Weight:  59.2 kg    Height:       General. Frail elderly lady, in no acute distress. Pulmonary.  Mild scattered crackles bilaterally, normal respiratory effort. CV.  Regular rate and rhythm, no JVD, rub or murmur. Abdomen.  Soft, nontender, nondistended, BS positive. CNS.  Alert and oriented .  No focal neurologic deficit. Extremities.  No edema,  pulses intact and symmetrical. Psychiatry.  Judgment and insight appears normal.   Data Reviewed: Prior data reviewed  Family Communication: Discussed with patient  Disposition: Status is: Inpatient Remains inpatient appropriate because: Severity of illness  Planned Discharge Destination: Home  DVT prophylaxis.  Subcu heparin  Time spent: 50 minutes  This record has been created using Conservation officer, historic buildings. Errors have been sought and corrected,but may not always be located. Such creation errors do not reflect on the standard of care.   Author: Amaryllis Dare, MD 07/24/2024 2:14 PM  For on call review [email or web address].  "

## 2024-07-25 ENCOUNTER — Encounter
Admission: EM | Disposition: A | Discharge status: Home Health | Payer: Self-pay | Source: Home / Self Care | Attending: Internal Medicine

## 2024-07-25 ENCOUNTER — Encounter: Payer: Self-pay | Admitting: Hospitalist

## 2024-07-25 DIAGNOSIS — J189 Pneumonia, unspecified organism: Secondary | ICD-10-CM | POA: Diagnosis not present

## 2024-07-25 DIAGNOSIS — J9601 Acute respiratory failure with hypoxia: Secondary | ICD-10-CM | POA: Diagnosis not present

## 2024-07-25 DIAGNOSIS — I2510 Atherosclerotic heart disease of native coronary artery without angina pectoris: Secondary | ICD-10-CM

## 2024-07-25 DIAGNOSIS — I272 Pulmonary hypertension, unspecified: Secondary | ICD-10-CM

## 2024-07-25 DIAGNOSIS — I5023 Acute on chronic systolic (congestive) heart failure: Secondary | ICD-10-CM | POA: Diagnosis not present

## 2024-07-25 DIAGNOSIS — I5021 Acute systolic (congestive) heart failure: Secondary | ICD-10-CM | POA: Diagnosis not present

## 2024-07-25 DIAGNOSIS — E11649 Type 2 diabetes mellitus with hypoglycemia without coma: Secondary | ICD-10-CM | POA: Diagnosis not present

## 2024-07-25 DIAGNOSIS — I10 Essential (primary) hypertension: Secondary | ICD-10-CM | POA: Diagnosis not present

## 2024-07-25 HISTORY — PX: RIGHT AND LEFT HEART CATH: CATH118262

## 2024-07-25 LAB — POCT I-STAT 7, (LYTES, BLD GAS, ICA,H+H)
Acid-Base Excess: 5 mmol/L — ABNORMAL HIGH (ref 0.0–2.0)
Bicarbonate: 29.1 mmol/L — ABNORMAL HIGH (ref 20.0–28.0)
Calcium, Ion: 1.16 mmol/L (ref 1.15–1.40)
HCT: 31 % — ABNORMAL LOW (ref 36.0–46.0)
Hemoglobin: 10.5 g/dL — ABNORMAL LOW (ref 12.0–15.0)
O2 Saturation: 90 %
Potassium: 3.9 mmol/L (ref 3.5–5.1)
Sodium: 136 mmol/L (ref 135–145)
TCO2: 30 mmol/L (ref 22–32)
pCO2 arterial: 40.3 mmHg (ref 32–48)
pH, Arterial: 7.467 — ABNORMAL HIGH (ref 7.35–7.45)
pO2, Arterial: 56 mmHg — ABNORMAL LOW (ref 83–108)

## 2024-07-25 LAB — GLUCOSE, CAPILLARY
Glucose-Capillary: 216 mg/dL — ABNORMAL HIGH (ref 70–99)
Glucose-Capillary: 251 mg/dL — ABNORMAL HIGH (ref 70–99)
Glucose-Capillary: 202 mg/dL — ABNORMAL HIGH (ref 70–99)
Glucose-Capillary: 295 mg/dL — ABNORMAL HIGH (ref 70–99)

## 2024-07-25 LAB — BASIC METABOLIC PANEL WITH GFR
Anion gap: 10 (ref 5–15)
BUN: 26 mg/dL — ABNORMAL HIGH (ref 8–23)
CO2: 29 mmol/L (ref 22–32)
Calcium: 8.9 mg/dL (ref 8.9–10.3)
Chloride: 99 mmol/L (ref 98–111)
Creatinine, Ser: 1.68 mg/dL — ABNORMAL HIGH (ref 0.44–1.00)
GFR, Estimated: 31 mL/min — ABNORMAL LOW
Glucose, Bld: 162 mg/dL — ABNORMAL HIGH (ref 70–99)
Potassium: 3.5 mmol/L (ref 3.5–5.1)
Sodium: 139 mmol/L (ref 135–145)

## 2024-07-25 LAB — CBC
HCT: 31.8 % — ABNORMAL LOW (ref 36.0–46.0)
Hemoglobin: 10.3 g/dL — ABNORMAL LOW (ref 12.0–15.0)
MCH: 29.9 pg (ref 26.0–34.0)
MCHC: 32.4 g/dL (ref 30.0–36.0)
MCV: 92.2 fL (ref 80.0–100.0)
Platelets: 287 K/uL (ref 150–400)
RBC: 3.45 MIL/uL — ABNORMAL LOW (ref 3.87–5.11)
RDW: 13.7 % (ref 11.5–15.5)
WBC: 7.9 K/uL (ref 4.0–10.5)
nRBC: 0 % (ref 0.0–0.2)

## 2024-07-25 LAB — POCT I-STAT EG7
Acid-Base Excess: 6 mmol/L — ABNORMAL HIGH (ref 0.0–2.0)
Bicarbonate: 30.9 mmol/L — ABNORMAL HIGH (ref 20.0–28.0)
Calcium, Ion: 1.17 mmol/L (ref 1.15–1.40)
HCT: 31 % — ABNORMAL LOW (ref 36.0–46.0)
Hemoglobin: 10.5 g/dL — ABNORMAL LOW (ref 12.0–15.0)
O2 Saturation: 57 %
Potassium: 3.7 mmol/L (ref 3.5–5.1)
Sodium: 136 mmol/L (ref 135–145)
TCO2: 32 mmol/L (ref 22–32)
pCO2, Ven: 45.1 mmHg (ref 44–60)
pH, Ven: 7.443 — ABNORMAL HIGH (ref 7.25–7.43)
pO2, Ven: 29 mmHg — CL (ref 32–45)

## 2024-07-25 LAB — HEPARIN LEVEL (UNFRACTIONATED): Heparin Unfractionated: 0.36 [IU]/mL (ref 0.30–0.70)

## 2024-07-25 MED ORDER — FREE WATER: 250.0000 mL | Freq: Once | Status: DC

## 2024-07-25 MED ORDER — SODIUM CHLORIDE 0.9% FLUSH: 3.0000 mL | INTRAVENOUS | Status: DC | PRN

## 2024-07-25 MED ORDER — BOOST / RESOURCE BREEZE PO LIQD CUSTOM
1.0000 | Freq: Three times a day (TID) | ORAL | Status: DC
  Administered 2024-07-25: 1 via ORAL

## 2024-07-25 MED ORDER — SODIUM CHLORIDE 0.9 % IV SOLN
250.0000 mL | INTRAVENOUS | Status: DC | PRN
  Administered 2024-07-25: 250 mL via INTRAVENOUS

## 2024-07-25 MED ORDER — GLUCERNA SHAKE PO LIQD: 237.0000 mL | Freq: Three times a day (TID) | ORAL | Status: DC

## 2024-07-25 MED ORDER — MIDAZOLAM HCL (PF) 2 MG/2ML IJ SOLN
INTRAMUSCULAR | Status: DC | PRN
  Administered 2024-07-25: 1 mg via INTRAVENOUS

## 2024-07-25 MED ORDER — FENTANYL CITRATE (PF) 100 MCG/2ML IJ SOLN
INTRAMUSCULAR | Status: DC | PRN
  Administered 2024-07-25: 25 ug via INTRAVENOUS

## 2024-07-25 MED ORDER — SODIUM CHLORIDE 0.9% FLUSH
3.0000 mL | Freq: Two times a day (BID) | INTRAVENOUS | Status: DC
  Administered 2024-07-25: 3 mL via INTRAVENOUS

## 2024-07-25 MED ORDER — MIDAZOLAM HCL 2 MG/2ML IJ SOLN
INTRAMUSCULAR | Status: AC
  Filled 2024-07-25: qty 2

## 2024-07-25 MED ORDER — HEPARIN (PORCINE) IN NACL 1000-0.9 UT/500ML-% IV SOLN
INTRAVENOUS | Status: DC | PRN
  Administered 2024-07-25: 1000 mL

## 2024-07-25 MED ORDER — SODIUM CHLORIDE 0.9% FLUSH
3.0000 mL | Freq: Two times a day (BID) | INTRAVENOUS | Status: DC
  Administered 2024-07-25 – 2024-07-26 (×2): 3 mL via INTRAVENOUS

## 2024-07-25 MED ORDER — SODIUM CHLORIDE 0.9 % IV SOLN: 250.0000 mL | INTRAVENOUS | Status: DC | PRN

## 2024-07-25 MED ORDER — LIDOCAINE HCL (PF) 1 % IJ SOLN
INTRAMUSCULAR | Status: DC | PRN
  Administered 2024-07-25 (×2): 5 mL

## 2024-07-25 MED ORDER — LIDOCAINE HCL 1 % IJ SOLN
INTRAMUSCULAR | Status: AC
  Filled 2024-07-25: qty 20

## 2024-07-25 MED ORDER — FENTANYL CITRATE (PF) 100 MCG/2ML IJ SOLN
INTRAMUSCULAR | Status: AC
  Filled 2024-07-25: qty 2

## 2024-07-25 MED ORDER — ISOSORBIDE MONONITRATE ER 30 MG PO TB24
60.0000 mg | ORAL_TABLET | Freq: Every day | ORAL | Status: DC
  Administered 2024-07-26: 60 mg via ORAL
  Filled 2024-07-25 (×2): qty 2

## 2024-07-25 MED ORDER — HEPARIN (PORCINE) IN NACL 1000-0.9 UT/500ML-% IV SOLN
INTRAVENOUS | Status: AC
  Filled 2024-07-25: qty 1000

## 2024-07-25 MED ORDER — IOHEXOL 300 MG/ML  SOLN
INTRAMUSCULAR | Status: DC | PRN
  Administered 2024-07-25: 34 mL

## 2024-07-25 MED ORDER — SACUBITRIL-VALSARTAN 24-26 MG PO TABS
1.0000 | ORAL_TABLET | Freq: Two times a day (BID) | ORAL | Status: DC
  Administered 2024-07-25 – 2024-07-26 (×2): 1 via ORAL
  Filled 2024-07-25 (×3): qty 1

## 2024-07-25 NOTE — Assessment & Plan Note (Signed)
 Initially required BiPAP and nonrebreather, likely secondary to pulmonary edema with acute HFrEF.  Now on room air.  Home use of 2 L mentioned in her chart but according to patient she was not using any oxygen at home.  - Continue to monitor and use oxygen if needed

## 2024-07-25 NOTE — Progress Notes (Signed)
 PHARMACY - ANTICOAGULATION CONSULT NOTE  Pharmacy Consult for [facility] Indication: chest pain/ACS  Allergies[1]  Patient Measurements: Height: 5' 6 (167.6 cm) Weight: 59.9 kg (132 lb 0.9 oz) IBW/kg (Calculated) : 59.3 HEPARIN  DW (KG): 54.3  Labs: Recent Labs    07/22/24 1646 07/23/24 0226 07/23/24 0230 07/23/24 1204 07/24/24 0447 07/24/24 1248 07/25/24 0447  HGB   < >  --  10.3*  --  10.4*  --  10.3*  HCT  --   --  31.6*  --  31.5*  --  31.8*  PLT  --   --  296  --  283  --  287  HEPARINUNFRC  --   --  0.27*   < > 0.43 0.35 0.36  CREATININE  --  1.58*  --   --  1.57*  --  1.68*   < > = values in this interval not displayed.    Estimated Creatinine Clearance: 26.7 mL/min (A) (by C-G formula based on SCr of 1.68 mg/dL (H)).   Medical History: Past Medical History:  Diagnosis Date   Acute pain of left shoulder 01/17/2019   Aortic atherosclerosis    CHF (congestive heart failure) (HCC) 09/16/2013   a.) TTE 09/16/2013: EF 55-60%; mod inferior HK, triv TR; G1DD. b.) TTE 01/02/2016: EF 25-30%; mid-apicalateroseptal, lat, inf, inferoseptal, apical akinesis; triv TR; G1DD. c. TTE 04/16/2016: EF 65%; G1DD. d.) TTE 05/02/2020: EF 50-55%; G2DD. e.) TTE 04/11/2021: EF 55%; G1DD.   Chronic low back pain with bilateral sciatica    Chronic painful diabetic neuropathy (HCC)    Chronic radicular lumbar pain    CKD (chronic kidney disease), stage III (HCC)    Clostridioides difficile infection 2015   COPD (chronic obstructive pulmonary disease) (HCC)    Coronary artery disease    a.) PCI 02/06/2011: 100% mLCx - DES x 3. b.) PCI 09/17/2011: 95% dRCA - DES x 1. c.) PCI 10/02/2011: 75% pLCX - DES x 1. d.) PCI 08/19/2012: DES x 2 p-mRCA.  e.) LHC 01/01/16: Takotsubo event 01/01/2016 with patent stents   Depression    Diverticulosis    Failed back syndrome    Frequent PVCs    a. Noted in hospital 12/2015.   GERD (gastroesophageal reflux disease)    History of bilateral cataract extraction  01/2015   History of heart artery stent    a.) TOTAL of 7 stents --> 02/06/2011 - overlapping 2.5 x 12 mm Xience V, 2.5 x 8 mm Xience V, 2.25 x 8 mm Xience Nano to mLCx; 09/17/2011 - 2.5 x 23 Xience V dRCA; 10/02/2011 - 2.5 x 15 mm Xience V pLCx; 08/09/2012 - overlapping 3.0 x 38mm mRCA and 3.5 x 23mm pRCA   Hyperlipidemia    Hypertension    Hypothyroidism    Impingement syndrome of left shoulder    LBBB (left bundle branch block)    Long term current use of clopidogrel     Marijuana abuse    [facility] infarction [facility])    a.) multiple MIs;  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 12/2015 - nephew committed suicide 1 week prior, sister died the morning of presentation - initially called a STEMI; cath with patent stents. LVEF 25-30%.   Tendonitis of left rotator cuff    Tobacco abuse    Vaginal burning 03/22/2019   Vascular dementia    Assessment: 79 y/o F with a known h/o CAD and recent stent to LAD this month admitted with CP. Patient received Lovenox  this morning.   1/2@0640 : HL 0.27, subtherapeutic  1/2@1646 : HL 0.16, subtherepeutic 1/3@0230 : HL 0.27, SUBtherapeutic  1/3@1204 : HL 0.35, therapeutic x 1 1/3@1957 : HL 0.29, SUBtherapeutic  1/4@0447 : HL 0.43, therapeutic x 1 1/4@1248 : HL 0.35, therapeutic x 2 1/5@0447 : HL 0.36, therapeutic x 3  Goal of Therapy:  Heparin  level 0.3-0.7 units/ml Monitor platelets by anticoagulation protocol: Yes   Plan:  --Heparin  level is therapeutic x 3 --Continue heparin  infusion at 1100 units/hr --Re-check HL and CBC  tomorrow AM  Haniah Penny D 07/25/2024 6:24 AM     [1] No Known Allergies

## 2024-07-25 NOTE — Plan of Care (Signed)
?  Problem: Clinical Measurements: ?Goal: Respiratory complications will improve ?Outcome: Progressing ?  ?Problem: Activity: ?Goal: Risk for activity intolerance will decrease ?Outcome: Progressing ?  ?Problem: Elimination: ?Goal: Will not experience complications related to urinary retention ?Outcome: Progressing ?  ?

## 2024-07-25 NOTE — Progress Notes (Signed)
" °   07/25/24 1115  Spiritual Encounters  Type of Visit Initial  Care provided to: Pt and family  Conversation partners present during encounter Nurse  Referral source Chaplain assessment  Reason for visit Routine spiritual support  Interventions  Spiritual Care Interventions Made Established relationship of care and support;Compassionate presence;Prayer  Intervention Outcomes  Outcomes Connection to spiritual care;Awareness around self/spiritual resourses  Spiritual Care Plan  Spiritual Care Issues Still Outstanding No further spiritual care needs at this time (see row info)   Chaplain assisted daughter to help find her mother who had been taken to [facility] and the daughter could not get to her. Chaplain then prayed for pt's procedure "

## 2024-07-25 NOTE — Progress Notes (Addendum)
 " Progress Note   Patient: Sarah Payne FMW:[id number] DOB: 04/12/1946 DOA: 07/21/2024     4 DOS: the patient was seen and examined on 07/25/2024   Brief hospital course: Partly taken from prior notes.   Sarah Payne is a pleasant 79 y.o. female with medical history significant for CAD s/p recent PCI, COPD on 2 L home oxygen, CHF, diabetes, GERD, HTN, HLD, hypothyroidism who came into ED complaining of shortness of breath that has gotten worse over 1 to 2 days.  Per EMT report patient was desaturating in 70s and was placed on 1 breather.  On presentation patient was hypoxic up to 85% on nonrebreather, she was placed on BiPAP.  Blood pressure was elevated at 204/143, tachycardic and tachypneic.  Labs with leukocytosis at 15.9,proBNP was 8644, bicarb was 20, creatinine 1.67, anion gap of 16, Troponin was 162, COVID flu and RSV negative.  Chest x-ray showed flash pulmonary edema.   Patient was given IV Lasix  and was admitted for concern of acute on chronic hypoxic respiratory failure with pulmonary edema.  Echocardiogram was obtained which shows a new diagnosis of HFrEF with EF of 20 to 25%, prior EF of 55%.  Cardiology was consulted and they were recommending continuation of IV diuresis, will get right and left cardiac cath on Monday.  Patient also received antibiotics for concern of pneumonia with leukocytosis and procalcitonin of 4.19.  Although 2 L of home oxygen was mentioned everywhere, per patient she was not on any oxygen at home.  She was able to wean back to room air.  1/4: Hemodynamically stable, now on room air.  Mild hypokalemia which is being repleted, stable renal function, net negative of about 4 L.  1/5: Remained hemodynamically stable, cardiac cath today with no ischemic reason for new diagnosis of HFrEF.  Did had patent prior stents and subtotally occluded vessels with left-to-right collaterals which were unchanged. Cardiology is going to start low-dose Entresto  and will  introduce p.o. diuretic from tomorrow.  Assessment and Plan: * Acute respiratory failure with hypoxia (HCC) Initially required BiPAP and nonrebreather, likely secondary to pulmonary edema with acute HFrEF.  Now on room air.  Home use of 2 L mentioned in her chart but according to patient she was not using any oxygen at home.  - Continue to monitor and use oxygen if needed  Acute systolic heart failure (HCC) Echocardiogram with new reduced EF of 20 to 25% with global hypokinesis, prior EF was 55 to 60%.  Cardiology is on board. Elevated proBNP. Right and left cath was without any significant new ischemic changes which can explain new diagnosis of HFrEF -IV Lasix  was discontinued due to increasing creatinine, patient will be started on p.o. Lasix  from tomorrow per cardiology -Patient was also started on low-dose Entresto  -Daily weight and BMP -Strict intake and output - Rest of GDMT to be added by cardiology slowly  CAP (community acquired pneumonia) Some concern of pneumonia with leukocytosis and procalcitonin of 4.19. Blood cultures remain negative. - Continue with ceftriaxone  and Zithromax  to complete a 5-day course -Continue with supportive care  Uncontrolled type 2 diabetes mellitus with hypoglycemia, with long-term current use of insulin  (HCC) Blood glucose elevated. - Making SSI moderate -Add 3 units with meal -Continue to monitor  History of CAD (coronary artery disease) S/p PCI.  Mildly elevated troponin likely secondary to demand ischemia with pulmonary edema and HFrEF. No chest pain. - Continue home aspirin  and Plavix  -Continuing home statin and Imdur  - Cardiac cath  today was negative for any new abnormality.  Did had prior significant disease.  COPD (chronic obstructive pulmonary disease) (HCC) No wheezing today. -As needed bronchodilator  CKD (chronic kidney disease), stage III (HCC) Seems like underlying CKD stage IIIb. Creatinine within baseline with slight  worsening.  IV Lasix  was discontinued -Monitor renal function while she is being diuresed -Avoid nephrotoxins  Hyperlipidemia LDL goal <70 - Continue home Lipitor   Hypertension Continue home amlodipine . Patient is also getting IV Lasix . - Cardiology to introduce GDMT slowly  Hypothyroidism - Continue home Synthroid    Subjective: Patient denies any chest pain or shortness of breath when seen today.  She was on room air.  Awaiting cardiac cath.  Physical Exam: Vitals:   07/25/24 1443 07/25/24 1448 07/25/24 1500 07/25/24 1515  BP: 136/75 (!) 144/80 125/71 139/65  Pulse: 68 74 60 61  Resp: 16 (!) 26 13 (!) 21  Temp:      TempSrc:      SpO2: 97% 97% 96% 97%  Weight:      Height:       General.  Frail elderly lady, in no acute distress. Pulmonary.  Lungs clear bilaterally, normal respiratory effort. CV.  Regular rate and rhythm, no JVD, rub or murmur. Abdomen.  Soft, nontender, nondistended, BS positive. CNS.  Alert and oriented .  No focal neurologic deficit. Extremities.  No edema, no cyanosis, pulses intact and symmetrical. Psychiatry.  Judgment and insight appears normal.   Data Reviewed: Prior data reviewed  Family Communication: Talked with daughter on phone.  Disposition: Status is: Inpatient Remains inpatient appropriate because: Severity of illness  Planned Discharge Destination: Home  DVT prophylaxis.  Subcu heparin  Time spent: 50 minutes  This record has been created using Conservation officer, historic buildings. Errors have been sought and corrected,but may not always be located. Such creation errors do not reflect on the standard of care.   Author: Amaryllis Dare, MD 07/25/2024 3:29 PM  For on call review [email or web address].  "

## 2024-07-25 NOTE — Interval H&P Note (Signed)
 History and Physical Interval Note:  07/25/2024 2:04 PM  Sarah Payne  has presented today for surgery, with the diagnosis of new onset heart failure with reduced ejection fraction and elevated high sensitivity troponin.  The various methods of treatment have been discussed with the patient and family. After consideration of risks, benefits and other options for treatment, the patient has consented to  Procedures: RIGHT AND LEFT HEART CATH (N/A) as a surgical intervention.  The patient's history has been reviewed, patient examined, no change in status, stable for surgery.  I have reviewed the patient's chart and labs.  Questions were answered to the patient's satisfaction.     Roxan Yamamoto

## 2024-07-25 NOTE — Assessment & Plan Note (Signed)
 S/p PCI.  Mildly elevated troponin likely secondary to demand ischemia with pulmonary edema and HFrEF. No chest pain. - Continue home aspirin  and Plavix  -Continuing home statin and Imdur  - Cardiac cath today was negative for any new abnormality.  Did had prior significant disease.

## 2024-07-25 NOTE — Inpatient Diabetes Management (Addendum)
 Inpatient Diabetes Program Recommendations  AACE/ADA: New Consensus Statement on Inpatient Glycemic Control  Target Ranges:  Prepandial:   less than 140 mg/dL      Peak postprandial:   less than 180 mg/dL (1-2 hours)      Critically ill patients:  140 - 180 mg/dL    Latest Reference Range & Units 07/24/24 08:47 07/24/24 12:48 07/24/24 16:44 07/24/24 21:49 07/25/24 08:11  Glucose-Capillary 70 - 99 mg/dL 768 (H) 599 (H) 876 (H) 323 (H) 216 (H)   Review of Glycemic Control  Diabetes history: DM2 Outpatient Diabetes medications: Novolog  5 units TID with meals, Januvia  100 mg daily Current orders for Inpatient glycemic control: Novolog  0-15 units TID with meals, Novolog  0-5 units at bedtime, Novolog  3 units TID with meals  Inpatient Diabetes Program Recommendations:    Insulin : Patient is currently NPO and in cath lab. Once diet resumed, please consider increasing meal coverage to Novolog  5 units TID with meals.  Thanks, Earnie Gainer, RN, MSN, CDCES Diabetes Coordinator Inpatient Diabetes Program [phone] (Team Pager from 8am to 5pm)

## 2024-07-25 NOTE — Assessment & Plan Note (Signed)
 Seems like underlying CKD stage IIIb. Creatinine within baseline with slight worsening.  IV Lasix  was discontinued -Monitor renal function while she is being diuresed -Avoid nephrotoxins

## 2024-07-25 NOTE — Assessment & Plan Note (Signed)
 Echocardiogram with new reduced EF of 20 to 25% with global hypokinesis, prior EF was 55 to 60%.  Cardiology is on board. Elevated proBNP. Right and left cath was without any significant new ischemic changes which can explain new diagnosis of HFrEF -IV Lasix  was discontinued due to increasing creatinine, patient will be started on p.o. Lasix  from tomorrow per cardiology -Patient was also started on low-dose Entresto  -Daily weight and BMP -Strict intake and output - Rest of GDMT to be added by cardiology slowly

## 2024-07-25 NOTE — Assessment & Plan Note (Signed)
 Some concern of pneumonia with leukocytosis and procalcitonin of 4.19. Blood cultures remain negative. - Continue with ceftriaxone  and Zithromax  to complete a 5-day course -Continue with supportive care

## 2024-07-25 NOTE — TOC Progression Note (Signed)
 Transition of Care [facility]) - Progression Note    Patient Details  Name: Sarah Payne MRN: [id number] Date of Birth: [birth year]  Transition of Care [facility]) CM/SW Contact  Shasta DELENA Daring, RN Phone Number: 07/25/2024, 3:12 PM  Clinical Narrative:    Patient in procedure.  Chart reviewed. No new TOC needs identified at this time.    Expected Discharge Plan: Home/Self Care Barriers to Discharge: No Barriers Identified               Expected Discharge Plan and Services   Discharge Planning Services: CM Consult   Living arrangements for the past 2 months: Single Family Home                         Representative spoke with at DME Agency: Patient cannot remember name of oxygen company - in [place]             Social Drivers of Health (SDOH) Interventions SDOH Screenings   Food Insecurity: No Food Insecurity (07/23/2024)  Housing: Low Risk (07/23/2024)  Transportation Needs: No Transportation Needs (07/23/2024)  Utilities: Not At Risk (07/23/2024)  Alcohol Screen: Low Risk (10/22/2023)  Depression (PHQ2-9): Medium Risk (06/24/2024)  Financial Resource Strain: Low Risk (10/22/2023)  Physical Activity: Insufficiently Active (10/22/2023)  Social Connections: Moderately Isolated (07/23/2024)  Stress: No Stress Concern Present (10/22/2023)  Tobacco Use: High Risk (07/25/2024)  Health Literacy: Adequate Health Literacy (10/22/2023)    Readmission Risk Interventions    07/22/2024   11:37 AM 07/22/2024   11:04 AM  Readmission Risk Prevention Plan  Transportation Screening Complete Complete  PCP or Specialist Appt within 3-5 Days Complete Not Complete  HRI or Home Care Consult Complete Complete  Social Work Consult for Recovery Care Planning/Counseling Complete Complete  Palliative Care Screening Not Applicable Not Applicable  Medication Review Oceanographer) Complete Complete

## 2024-07-25 NOTE — Plan of Care (Signed)
 " Problem: Education: Goal: Ability to describe self-care measures that may prevent or decrease complications (Diabetes Survival Skills Education) will improve 07/25/2024 0446 by Vicci Verla CROME, RN Outcome: Progressing 07/25/2024 0444 by Vicci Verla CROME, RN Outcome: Progressing   Problem: Coping: Goal: Ability to adjust to condition or change in health will improve 07/25/2024 0446 by Vicci Verla CROME, RN Outcome: Progressing 07/25/2024 0444 by Vicci Verla CROME, RN Outcome: Progressing   Problem: Fluid Volume: Goal: Ability to maintain a balanced intake and output will improve 07/25/2024 0446 by Vicci Verla CROME, RN Outcome: Progressing 07/25/2024 0444 by Vicci Verla CROME, RN Outcome: Progressing   Problem: Health Behavior/Discharge Planning: Goal: Ability to identify and utilize available resources and services will improve 07/25/2024 0446 by Vicci Verla CROME, RN Outcome: Progressing 07/25/2024 0444 by Vicci Verla CROME, RN Outcome: Progressing Goal: Ability to manage health-related needs will improve 07/25/2024 0446 by Vicci Verla CROME, RN Outcome: Progressing 07/25/2024 0444 by Vicci Verla CROME, RN Outcome: Progressing   Problem: Metabolic: Goal: Ability to maintain appropriate glucose levels will improve 07/25/2024 0446 by Vicci Verla CROME, RN Outcome: Progressing 07/25/2024 0444 by Vicci Verla CROME, RN Outcome: Progressing   Problem: Nutritional: Goal: Maintenance of adequate nutrition will improve 07/25/2024 0446 by Vicci Verla CROME, RN Outcome: Progressing 07/25/2024 0444 by Vicci Verla CROME, RN Outcome: Progressing Goal: Progress toward achieving an optimal weight will improve 07/25/2024 0446 by Vicci Verla CROME, RN Outcome: Progressing 07/25/2024 0444 by Vicci Verla CROME, RN Outcome: Progressing   Problem: Skin Integrity: Goal: Risk for impaired skin integrity will decrease 07/25/2024 0446 by Vicci Verla CROME, RN Outcome: Progressing 07/25/2024 0444 by  Vicci Verla CROME, RN Outcome: Progressing   Problem: Tissue Perfusion: Goal: Adequacy of tissue perfusion will improve 07/25/2024 0446 by Vicci Verla CROME, RN Outcome: Progressing 07/25/2024 0444 by Vicci Verla CROME, RN Outcome: Progressing   Problem: Education: Goal: Ability to demonstrate management of disease process will improve 07/25/2024 0446 by Vicci Verla CROME, RN Outcome: Progressing 07/25/2024 0444 by Vicci Verla CROME, RN Outcome: Progressing Goal: Ability to verbalize understanding of medication therapies will improve 07/25/2024 0446 by Vicci Verla CROME, RN Outcome: Progressing 07/25/2024 0444 by Vicci Verla CROME, RN Outcome: Progressing Goal: Individualized Educational Video(s) 07/25/2024 0446 by Vicci Verla CROME, RN Outcome: Progressing 07/25/2024 0444 by Vicci Verla CROME, RN Outcome: Progressing   Problem: Activity: Goal: Capacity to carry out activities will improve 07/25/2024 0446 by Vicci Verla CROME, RN Outcome: Progressing 07/25/2024 0444 by Vicci Verla CROME, RN Outcome: Progressing   Problem: Cardiac: Goal: Ability to achieve and maintain adequate cardiopulmonary perfusion will improve 07/25/2024 0446 by Vicci Verla CROME, RN Outcome: Progressing 07/25/2024 0444 by Vicci Verla CROME, RN Outcome: Progressing   Problem: Education: Goal: Knowledge of General Education information will improve Description: Including pain rating scale, medication(s)/side effects and non-pharmacologic comfort measures 07/25/2024 0446 by Vicci Verla CROME, RN Outcome: Progressing 07/25/2024 0444 by Vicci Verla CROME, RN Outcome: Progressing   Problem: Health Behavior/Discharge Planning: Goal: Ability to manage health-related needs will improve 07/25/2024 0446 by Vicci Verla CROME, RN Outcome: Progressing 07/25/2024 0444 by Vicci Verla CROME, RN Outcome: Progressing   Problem: Clinical Measurements: Goal: Ability to maintain clinical measurements within normal limits  will improve 07/25/2024 0446 by Vicci Verla CROME, RN Outcome: Progressing 07/25/2024 0444 by Vicci Verla CROME, RN Outcome: Progressing Goal: Will remain free from infection 07/25/2024 0446 by Vicci Verla CROME, RN Outcome: Progressing 07/25/2024 0444 by Vicci Verla CROME, RN Outcome: Progressing Goal: Diagnostic test results will  improve 07/25/2024 0446 by Vicci Verla CROME, RN Outcome: Progressing 07/25/2024 0444 by Vicci Verla CROME, RN Outcome: Progressing Goal: Respiratory complications will improve 07/25/2024 0446 by Vicci Verla CROME, RN Outcome: Progressing 07/25/2024 0444 by Vicci Verla CROME, RN Outcome: Progressing Goal: Cardiovascular complication will be avoided 07/25/2024 0446 by Vicci Verla CROME, RN Outcome: Progressing 07/25/2024 0444 by Vicci Verla CROME, RN Outcome: Progressing   Problem: Activity: Goal: Risk for activity intolerance will decrease 07/25/2024 0446 by Vicci Verla CROME, RN Outcome: Progressing 07/25/2024 0444 by Vicci Verla CROME, RN Outcome: Progressing   Problem: Nutrition: Goal: Adequate nutrition will be maintained 07/25/2024 0446 by Vicci Verla CROME, RN Outcome: Progressing 07/25/2024 0444 by Vicci Verla CROME, RN Outcome: Progressing   Problem: Coping: Goal: Level of anxiety will decrease 07/25/2024 0446 by Vicci Verla CROME, RN Outcome: Progressing 07/25/2024 0444 by Vicci Verla CROME, RN Outcome: Progressing   Problem: Elimination: Goal: Will not experience complications related to bowel motility 07/25/2024 0446 by Vicci Verla CROME, RN Outcome: Progressing 07/25/2024 0444 by Vicci Verla CROME, RN Outcome: Progressing Goal: Will not experience complications related to urinary retention 07/25/2024 0446 by Vicci Verla CROME, RN Outcome: Progressing 07/25/2024 0444 by Vicci Verla CROME, RN Outcome: Progressing   Problem: Pain Managment: Goal: General experience of comfort will improve and/or be controlled 07/25/2024 0446 by Vicci Verla CROME, RN Outcome: Progressing 07/25/2024 0444 by Vicci Verla CROME, RN Outcome: Progressing   Problem: Safety: Goal: Ability to remain free from injury will improve 07/25/2024 0446 by Vicci Verla CROME, RN Outcome: Progressing 07/25/2024 0444 by Vicci Verla CROME, RN Outcome: Progressing   Problem: Skin Integrity: Goal: Risk for impaired skin integrity will decrease 07/25/2024 0446 by Vicci Verla CROME, RN Outcome: Progressing 07/25/2024 0444 by Vicci Verla CROME, RN Outcome: Progressing   Problem: Education: Goal: Understanding of CV disease, CV risk reduction, and recovery process will improve 07/25/2024 0446 by Vicci Verla CROME, RN Outcome: Progressing 07/25/2024 0444 by Vicci Verla CROME, RN Outcome: Progressing Goal: Individualized Educational Video(s) 07/25/2024 0446 by Vicci Verla CROME, RN Outcome: Progressing 07/25/2024 0444 by Vicci Verla CROME, RN Outcome: Progressing   Problem: Activity: Goal: Ability to return to baseline activity level will improve 07/25/2024 0446 by Vicci Verla CROME, RN Outcome: Progressing 07/25/2024 0444 by Vicci Verla CROME, RN Outcome: Progressing   Problem: Cardiovascular: Goal: Ability to achieve and maintain adequate cardiovascular perfusion will improve 07/25/2024 0446 by Vicci Verla CROME, RN Outcome: Progressing 07/25/2024 0444 by Vicci Verla CROME, RN Outcome: Progressing Goal: Vascular access site(s) Level 0-1 will be maintained 07/25/2024 0446 by Vicci Verla CROME, RN Outcome: Progressing 07/25/2024 0444 by Vicci Verla CROME, RN Outcome: Progressing   Problem: Health Behavior/Discharge Planning: Goal: Ability to safely manage health-related needs after discharge will improve 07/25/2024 0446 by Vicci Verla CROME, RN Outcome: Progressing 07/25/2024 0444 by Vicci Verla CROME, RN Outcome: Progressing   "

## 2024-07-25 NOTE — Care Management Important Message (Signed)
 Important Message  Patient Details  Name: Sarah Payne MRN: [id number] Date of Birth: December 30, 1945   Important Message Given:  Yes - Medicare IM     [name] 07/25/2024, 2:39 PM

## 2024-07-25 NOTE — Progress Notes (Signed)
 "  Rounding Note   Patient Name: Sarah Payne Date of Encounter: 07/25/2024  [facility] Cardiologist: Evalene Lunger, MD   Subjective She is feeling well with resolution of chest pain and shortness of breath.  Cardiac catheterization was performed via the right femoral artery and showed patent stents with no new obstructive disease. Right heart catheterization showed normal filling pressures and normal cardiac output.  Scheduled Meds:  [MAR Hold] aspirin  EC  81 mg Oral Daily   [MAR Hold] atorvastatin   80 mg Oral Daily   [MAR Hold] clopidogrel   75 mg Oral Daily   [MAR Hold] ezetimibe   10 mg Oral Daily   feeding supplement (GLUCERNA SHAKE)  237 mL Oral TID BM   free water   250 mL Oral Once   [MAR Hold] insulin  aspart  0-15 Units Subcutaneous TID WC   [MAR Hold] insulin  aspart  0-5 Units Subcutaneous QHS   [MAR Hold] insulin  aspart  3 Units Subcutaneous TID WC   [START ON 07/26/2024] isosorbide  mononitrate  60 mg Oral Daily   [MAR Hold] levothyroxine   100 mcg Oral Q0600   [MAR Hold] metoprolol  succinate  50 mg Oral Daily   sacubitril -valsartan   1 tablet Oral BID   sodium chloride  flush  3 mL Intravenous Q12H   [MAR Hold] umeclidinium-vilanterol  1 puff Inhalation Daily   Continuous Infusions:  sodium chloride      [MAR Hold] cefTRIAXone  (ROCEPHIN )  IV Stopped (07/24/24 1814)   PRN Meds: sodium chloride , [MAR Hold] acetaminophen  **OR** [MAR Hold] acetaminophen , [MAR Hold] hydrALAZINE , [MAR Hold] ipratropium-albuterol , [MAR Hold] mirtazapine , [MAR Hold]  morphine  injection, [MAR Hold] nitroGLYCERIN , [MAR Hold] ondansetron  **OR** [MAR Hold] ondansetron  (ZOFRAN ) IV, [MAR Hold] oxyCODONE , sodium chloride  flush   Vital Signs  Vitals:   07/25/24 1448 07/25/24 1500 07/25/24 1515 07/25/24 1530  BP: (!) 144/80 125/71 139/65 (!) 140/68  Pulse: 74 60 61 62  Resp: (!) 26 13 (!) 21 19  Temp:      TempSrc:      SpO2: 97% 96% 97% 94%  Weight:      Height:        Intake/Output  Summary (Last 24 hours) at 07/25/2024 1535 Last data filed at 07/25/2024 9178 Gross per 24 hour  Intake 1390.24 ml  Output 1050 ml  Net 340.24 ml      07/25/2024   10:53 AM 07/25/2024    5:00 AM 07/24/2024    5:00 AM  Last 3 Weights  Weight (lbs) 132 lb 0.9 oz 132 lb 0.9 oz 130 lb 8.2 oz  Weight (kg) 59.9 kg 59.9 kg 59.2 kg      Telemetry Sinus rhythm with a left bundle branch block and unifocal PVCs- Personally Reviewed  ECG  No new tracings- Personally Reviewed  Physical Exam  GEN: No acute distress.   Neck: no JVD Cardiac: RRR, no murmurs, rubs, or gallops.  Respiratory: Clear to auscultation GI: Soft, nontender, non-distended  MS: No edema; No deformity. Neuro:  Nonfocal  Psych: Normal affect   Labs High Sensitivity Troponin:  No results for input(s): TROPONINIHS in the last 720 hours.  Recent Labs  Lab 07/21/24 0441 07/21/24 1259 07/21/24 1519 07/22/24 1355 07/23/24 1204  TRNPT 134* 662* 795* 3,076* 2,880*       Chemistry Recent Labs  Lab 07/21/24 0243 07/22/24 0348 07/23/24 0226 07/24/24 0447 07/25/24 0447 07/25/24 1424 07/25/24 1430  NA 138 136 137 139 139 136 136  K 4.3 3.8 3.4* 3.4* 3.5 3.7 3.9  CL 102  97* 97* 99 99  --   --   CO2 20* 24 28 29 29   --   --   GLUCOSE 296* 226* 175* 155* 162*  --   --   BUN 19 22 25* 27* 26*  --   --   CREATININE 1.67* 1.47* 1.58* 1.57* 1.68*  --   --   CALCIUM  9.7 9.0 8.6* 8.6* 8.9  --   --   MG  --  2.0  --   --   --   --   --   PROT 7.6 6.2*  --   --   --   --   --   ALBUMIN 3.9 3.4*  --   --   --   --   --   AST 24 136*  --   --   --   --   --   ALT 14 19  --   --   --   --   --   ALKPHOS 128* 85  --   --   --   --   --   BILITOT 0.3 0.4  --   --   --   --   --   GFRNONAA 31* 37* 34* 34* 31*  --   --   ANIONGAP 16* 15 12 11 10   --   --     Lipids No results for input(s): CHOL, TRIG, HDL, LABVLDL, LDLCALC, CHOLHDL in the last 168 hours.  Hematology Recent Labs  Lab 07/23/24 0230 07/24/24 0447  07/25/24 0447 07/25/24 1424 07/25/24 1430  WBC 11.3* 7.8 7.9  --   --   RBC 3.49* 3.51* 3.45*  --   --   HGB 10.3* 10.4* 10.3* 10.5* 10.5*  HCT 31.6* 31.5* 31.8* 31.0* 31.0*  MCV 90.5 89.7 92.2  --   --   MCH 29.5 29.6 29.9  --   --   MCHC 32.6 33.0 32.4  --   --   RDW 14.1 13.9 13.7  --   --   PLT 296 283 287  --   --    Thyroid  No results for input(s): TSH, FREET4 in the last 168 hours.  BNP Recent Labs  Lab 07/21/24 0243  PROBNP 8,644.0*    DDimer No results for input(s): DDIMER in the last 168 hours.   Radiology    Cardiac Studies 2d echo 07/22/2023 1. Left ventricular ejection fraction, by estimation, is 25 to 30%. The  left ventricle has severely decreased function. The left ventricle  demonstrates global hypokinesis. Left ventricular diastolic parameters are  consistent with Grade I diastolic  dysfunction (impaired relaxation). The average left ventricular global  longitudinal strain is -7.5 %. The global longitudinal strain is abnormal.   2. Right ventricular systolic function is normal. The right ventricular  size is normal.   3. The mitral valve is normal in structure. Mild to moderate mitral valve  regurgitation. No evidence of mitral stenosis.   4. The aortic valve is normal in structure. Aortic valve regurgitation is  not visualized. Aortic valve sclerosis is present, with no evidence of  aortic valve stenosis.   5. The inferior vena cava is normal in size with greater than 50%  respiratory variability, suggesting right atrial pressure of 3 mmHg.   Patient Profile   79 y.o. female with a past medical history significant for coronary artery disease with prior stent x 3 placed to the mid left circumflex and moderate to severe LAD disease  with a last heart catheterization 06/21/2024 with stent placement to the proximal LAD, depression/anxiety, hypertension, smoking history, COPD, poorly controlled diabetes with hemoglobin A1c of 10, who is being seen and  evaluated for progressive worsening shortness of breath and chest discomfort.  Assessment & Plan  Acute systolic heart failure: Unclear etiology but could be due to left ventricular dyssynchrony related to left bundle branch block.  Cardiac catheterization this morning showed patent stents with no new obstructive disease.  Right heart catheterization showed that she is now euvolemic.  Recommend starting oral furosemide  40 mg once daily tomorrow.  I stopped amlodipine  and added small dose Entresto .  Monitor renal function closely.  Continue Toprol .  Hopefully, we can add an SGLT2 inhibitor and small dose spironolactone  in the near future.  Recommend repeat echocardiogram in 3 months on medical therapy.  If ejection fraction does not improve, consider CRT for underlying left bundle branch block. Coronary artery disease: Patent recent stents with no new obstructive disease.  Continue dual antiplatelet therapy and atorvastatin .  I stopped nitroglycerin  drip but increased oral Imdur  to 60 mg daily.  Essential hypertension: Amlodipine  was switched to Entresto  after cardiac catheterization.  Monitor renal function closely and consider adding small dose spironolactone .  Hyperlipidemia -Last LDL 44 -Continue on atorvastatin  80, ezetimibe  10 mg daily, PTA PCSK9 inhibitor on hold for hospitalization  Chronic kidney disease stage 3B -Baseline serum creatinine around 1.3 -34 mL of contrast was used for cardiac catheterization.  Entresto  was added today and we will have to monitor renal function closely.  Longstanding history of COPD not currently in exacerbation and a former smoker  Peripheral arterial disease/carotid artery stenosis -Patient has previously had intervention to the left SFA most recent ABI in 09/2022 was stable -Continued on DAPT and statin therapy  Physical deconditioning: The patient has not been contacted by cardiac rehab.  And will send them a message.  She does benefit from home health  to help with heart failure management as a new diagnosis.  Her daughter lives in [place] .     For questions or updates, please contact [facility] Please consult [email or web address] for contact info under       Signed, Deatrice Cage, MD  07/25/2024, 3:35 PM    "

## 2024-07-26 ENCOUNTER — Other Ambulatory Visit (HOSPITAL_COMMUNITY): Payer: Self-pay

## 2024-07-26 ENCOUNTER — Telehealth (HOSPITAL_COMMUNITY): Payer: Self-pay | Admitting: Pharmacy Technician

## 2024-07-26 ENCOUNTER — Other Ambulatory Visit: Payer: Self-pay

## 2024-07-26 DIAGNOSIS — R7989 Other specified abnormal findings of blood chemistry: Secondary | ICD-10-CM | POA: Diagnosis not present

## 2024-07-26 DIAGNOSIS — Z8679 Personal history of other diseases of the circulatory system: Secondary | ICD-10-CM | POA: Diagnosis not present

## 2024-07-26 DIAGNOSIS — J449 Chronic obstructive pulmonary disease, unspecified: Secondary | ICD-10-CM | POA: Diagnosis not present

## 2024-07-26 DIAGNOSIS — I509 Heart failure, unspecified: Secondary | ICD-10-CM | POA: Diagnosis not present

## 2024-07-26 DIAGNOSIS — I5021 Acute systolic (congestive) heart failure: Secondary | ICD-10-CM

## 2024-07-26 DIAGNOSIS — E11649 Type 2 diabetes mellitus with hypoglycemia without coma: Secondary | ICD-10-CM | POA: Diagnosis not present

## 2024-07-26 DIAGNOSIS — J9601 Acute respiratory failure with hypoxia: Secondary | ICD-10-CM | POA: Diagnosis not present

## 2024-07-26 DIAGNOSIS — E785 Hyperlipidemia, unspecified: Secondary | ICD-10-CM | POA: Diagnosis not present

## 2024-07-26 DIAGNOSIS — J189 Pneumonia, unspecified organism: Secondary | ICD-10-CM | POA: Diagnosis not present

## 2024-07-26 DIAGNOSIS — R42 Dizziness and giddiness: Secondary | ICD-10-CM | POA: Diagnosis not present

## 2024-07-26 DIAGNOSIS — E039 Hypothyroidism, unspecified: Secondary | ICD-10-CM | POA: Diagnosis not present

## 2024-07-26 DIAGNOSIS — I2489 Other forms of acute ischemic heart disease: Secondary | ICD-10-CM | POA: Diagnosis not present

## 2024-07-26 LAB — GLUCOSE, CAPILLARY
Glucose-Capillary: 250 mg/dL — ABNORMAL HIGH (ref 70–99)
Glucose-Capillary: 415 mg/dL — ABNORMAL HIGH (ref 70–99)

## 2024-07-26 LAB — BASIC METABOLIC PANEL WITH GFR
Anion gap: 11 (ref 5–15)
BUN: 23 mg/dL (ref 8–23)
CO2: 29 mmol/L (ref 22–32)
Calcium: 8.9 mg/dL (ref 8.9–10.3)
Chloride: 99 mmol/L (ref 98–111)
Creatinine, Ser: 1.53 mg/dL — ABNORMAL HIGH (ref 0.44–1.00)
GFR, Estimated: 35 mL/min — ABNORMAL LOW
Glucose, Bld: 198 mg/dL — ABNORMAL HIGH (ref 70–99)
Potassium: 4 mmol/L (ref 3.5–5.1)
Sodium: 139 mmol/L (ref 135–145)

## 2024-07-26 MED ORDER — ENOXAPARIN SODIUM 30 MG/0.3ML IJ SOSY
30.0000 mg | PREFILLED_SYRINGE | INTRAMUSCULAR | Status: DC
  Administered 2024-07-26: 30 mg via SUBCUTANEOUS
  Filled 2024-07-26: qty 0.3

## 2024-07-26 MED ORDER — GLUCERNA SHAKE PO LIQD
237.0000 mL | Freq: Three times a day (TID) | ORAL | 2 refills | Status: DC
  Filled 2024-07-26: qty 10000, 14d supply, fill #0

## 2024-07-26 MED ORDER — REPATHA SURECLICK 140 MG/ML ~~LOC~~ SOAJ: 140.0000 mg | SUBCUTANEOUS | 2 refills | Status: AC

## 2024-07-26 MED ORDER — INSULIN ASPART 100 UNIT/ML IJ SOLN
20.0000 [IU] | Freq: Once | INTRAMUSCULAR | Status: AC
  Administered 2024-07-26: 20 [IU] via SUBCUTANEOUS
  Filled 2024-07-26: qty 20

## 2024-07-26 MED ORDER — SACUBITRIL-VALSARTAN 24-26 MG PO TABS
1.0000 | ORAL_TABLET | Freq: Two times a day (BID) | ORAL | 2 refills | Status: AC
  Filled 2024-07-26: qty 60, 30d supply, fill #0

## 2024-07-26 MED ORDER — DAPAGLIFLOZIN PROPANEDIOL 10 MG PO TABS
10.0000 mg | ORAL_TABLET | Freq: Every day | ORAL | 2 refills | Status: AC
  Filled 2024-07-26: qty 30, 30d supply, fill #0

## 2024-07-26 MED ORDER — DAPAGLIFLOZIN PROPANEDIOL 10 MG PO TABS
10.0000 mg | ORAL_TABLET | Freq: Every day | ORAL | Status: DC
  Administered 2024-07-26: 10 mg via ORAL
  Filled 2024-07-26: qty 1

## 2024-07-26 MED ORDER — ISOSORBIDE MONONITRATE ER 60 MG PO TB24
60.0000 mg | ORAL_TABLET | Freq: Every day | ORAL | 2 refills | Status: AC
  Filled 2024-07-26: qty 30, 30d supply, fill #0

## 2024-07-26 MED ORDER — FUROSEMIDE 40 MG PO TABS
40.0000 mg | ORAL_TABLET | ORAL | 2 refills | Status: AC
  Filled 2024-07-26: qty 30, 70d supply, fill #0

## 2024-07-26 NOTE — Progress Notes (Signed)
 "  Rounding Note   Patient Name: Sarah Payne Date of Encounter: 07/26/2024  [facility] Cardiologist: Evalene Lunger, MD   Subjective She feels well this morning and is without symptoms of angina or cardiac decompensation.  Breathing back to baseline.  Feels ready to go home.  Scheduled Meds:  aspirin  EC  81 mg Oral Daily   atorvastatin   80 mg Oral Daily   clopidogrel   75 mg Oral Daily   dapagliflozin  propanediol  10 mg Oral Daily   enoxaparin  (LOVENOX ) injection  30 mg Subcutaneous Q24H   ezetimibe   10 mg Oral Daily   feeding supplement (GLUCERNA SHAKE)  237 mL Oral TID BM   free water   250 mL Oral Once   insulin  aspart  0-15 Units Subcutaneous TID WC   insulin  aspart  0-5 Units Subcutaneous QHS   insulin  aspart  3 Units Subcutaneous TID WC   isosorbide  mononitrate  60 mg Oral Daily   levothyroxine   100 mcg Oral Q0600   metoprolol  succinate  50 mg Oral Daily   sacubitril -valsartan   1 tablet Oral BID   sodium chloride  flush  3 mL Intravenous Q12H   umeclidinium-vilanterol  1 puff Inhalation Daily   Continuous Infusions:  sodium chloride      cefTRIAXone  (ROCEPHIN )  IV Stopped (07/25/24 1812)   PRN Meds: sodium chloride , acetaminophen  **OR** acetaminophen , hydrALAZINE , ipratropium-albuterol , mirtazapine , morphine  injection, nitroGLYCERIN , ondansetron  **OR** ondansetron  (ZOFRAN ) IV, oxyCODONE , sodium chloride  flush   Vital Signs  Vitals:   07/26/24 0016 07/26/24 0445 07/26/24 0509 07/26/24 0755  BP: (!) 141/65 127/70  133/63  Pulse: 61 (!) 58  66  Resp:  20  18  Temp: 97.7 F (36.5 C) 98.4 F (36.9 C)  98.3 F (36.8 C)  TempSrc: Oral Oral  Oral  SpO2: 99% 98%  97%  Weight:   59 kg   Height:       No intake or output data in the 24 hours ending 07/26/24 1130     07/26/2024    5:09 AM 07/25/2024   10:53 AM 07/25/2024    5:00 AM  Last 3 Weights  Weight (lbs) 130 lb 1.1 oz 132 lb 0.9 oz 132 lb 0.9 oz  Weight (kg) 59 kg 59.9 kg 59.9 kg      Telemetry Sinus  rhythm with bundle branch block and unifocal PVCs with first-degree AV block- Personally Reviewed  ECG  No new tracings - Personally Reviewed  Physical Exam  GEN: No acute distress.   Neck: no JVD Cardiac: RRR, no murmurs, rubs, or gallops.  Right femoral arteriotomy site without active bleeding or hematoma.  No tenderness to palpation or swelling.  No bruit. Respiratory: Clear to auscultation GI: Soft, nontender, non-distended  MS: No edema; No deformity. Neuro:  Nonfocal  Psych: Normal affect   Labs High Sensitivity Troponin:  No results for input(s): TROPONINIHS in the last 720 hours.  Recent Labs  Lab 07/21/24 0441 07/21/24 1259 07/21/24 1519 07/22/24 1355 07/23/24 1204  TRNPT 134* 662* 795* 3,076* 2,880*       Chemistry Recent Labs  Lab 07/21/24 0243 07/22/24 0348 07/23/24 0226 07/24/24 0447 07/25/24 0447 07/25/24 1424 07/25/24 1430 07/26/24 0417  NA 138 136   < > 139 139 136 136 139  K 4.3 3.8   < > 3.4* 3.5 3.7 3.9 4.0  CL 102 97*   < > 99 99  --   --  99  CO2 20* 24   < > 29 29  --   --  29  GLUCOSE 296* 226*   < > 155* 162*  --   --  198*  BUN 19 22   < > 27* 26*  --   --  23  CREATININE 1.67* 1.47*   < > 1.57* 1.68*  --   --  1.53*  CALCIUM  9.7 9.0   < > 8.6* 8.9  --   --  8.9  MG  --  2.0  --   --   --   --   --   --   PROT 7.6 6.2*  --   --   --   --   --   --   ALBUMIN 3.9 3.4*  --   --   --   --   --   --   AST 24 136*  --   --   --   --   --   --   ALT 14 19  --   --   --   --   --   --   ALKPHOS 128* 85  --   --   --   --   --   --   BILITOT 0.3 0.4  --   --   --   --   --   --   GFRNONAA 31* 37*   < > 34* 31*  --   --  35*  ANIONGAP 16* 15   < > 11 10  --   --  11   < > = values in this interval not displayed.    Lipids No results for input(s): CHOL, TRIG, HDL, LABVLDL, LDLCALC, CHOLHDL in the last 168 hours.  Hematology Recent Labs  Lab 07/23/24 0230 07/24/24 0447 07/25/24 0447 07/25/24 1424 07/25/24 1430  WBC 11.3* 7.8  7.9  --   --   RBC 3.49* 3.51* 3.45*  --   --   HGB 10.3* 10.4* 10.3* 10.5* 10.5*  HCT 31.6* 31.5* 31.8* 31.0* 31.0*  MCV 90.5 89.7 92.2  --   --   MCH 29.5 29.6 29.9  --   --   MCHC 32.6 33.0 32.4  --   --   RDW 14.1 13.9 13.7  --   --   PLT 296 283 287  --   --    Thyroid  No results for input(s): TSH, FREET4 in the last 168 hours.  BNP Recent Labs  Lab 07/21/24 0243  PROBNP 8,644.0*    DDimer No results for input(s): DDIMER in the last 168 hours.   Radiology    Cardiac Studies 2d echo 07/22/2023 1. Left ventricular ejection fraction, by estimation, is 25 to 30%. The  left ventricle has severely decreased function. The left ventricle  demonstrates global hypokinesis. Left ventricular diastolic parameters are  consistent with Grade I diastolic  dysfunction (impaired relaxation). The average left ventricular global  longitudinal strain is -7.5 %. The global longitudinal strain is abnormal.   2. Right ventricular systolic function is normal. The right ventricular  size is normal.   3. The mitral valve is normal in structure. Mild to moderate mitral valve  regurgitation. No evidence of mitral stenosis.   4. The aortic valve is normal in structure. Aortic valve regurgitation is  not visualized. Aortic valve sclerosis is present, with no evidence of  aortic valve stenosis.   5. The inferior vena cava is normal in size with greater than 50%  respiratory variability, suggesting right atrial pressure of 3 mmHg.  __________  R/LHC 07/25/2024:   Mid RCA to Dist RCA lesion is 30% stenosed.   Mid LAD to Dist LAD lesion is 40% stenosed.   Prox Cx to Dist Cx lesion is 30% stenosed.   RPDA-1 lesion is 90% stenosed.   RPDA-2 lesion is 50% stenosed.   1st Diag lesion is 55% stenosed.   1st Mrg lesion is 70% stenosed.   Dist Cx lesion is 50% stenosed.   Prox RCA to Mid RCA lesion is 30% stenosed.   Non-stenotic Dist RCA lesion was previously treated.   Non-stenotic Prox LAD to Mid  LAD lesion was previously treated.   Non-stenotic RPAV lesion was previously treated.   1.  Patent LAD and RCA stents with no significant change since most recent PCI.  The ostial right PDA is subtotally occluded and jailed by previous stents with faint left-to-right collaterals.  This is unchanged. 2.  Left ventricular angiography was not performed due to chronic kidney disease.  EF was moderately severely reduced by echo. 3.  Right heart catheterization showed normal right and left-sided filling pressures, mild pulmonary hypertension and normal cardiac output.   Recommendations: Drop in ejection fraction does not seem to be due to an ischemic etiology.  Recommend continuing medical therapy of coronary artery disease. Regarding heart failure, I elected to stop amlodipine  and introduce a small dose of Entresto  to see if she can tolerate.  Recommend starting a small dose oral diuretic tomorrow.  Patient Profile   79 y.o. female with a past medical history significant for coronary artery disease with prior stent x 3 placed to the mid left circumflex and moderate to severe LAD disease with a last heart catheterization 06/21/2024 with stent placement to the proximal LAD, depression/anxiety, hypertension, smoking history, COPD, poorly controlled diabetes with hemoglobin A1c of 10, who is being seen and evaluated for progressive worsening shortness of breath and chest discomfort.  Assessment & Plan  Acute systolic heart failure: Unclear etiology but could be due to left ventricular dyssynchrony related to left bundle branch block versus possible Takotsubo.  Cardiac catheterization this admission showed patent stents with no new obstructive disease.  Right heart catheterization showed that she is now euvolemic.  Now on furosemide  40 mg once daily tomorrow and Entresto  24/26 mg twice daily in place of amlodipine  along with continuation of Toprol -XL 50 mg daily.  Add Farxiga  10 mg today.  Defer addition of MRA  at this time given underlying renal dysfunction.  Monitor renal function closely.  Recommend repeat echocardiogram in 3 months on medical therapy.  If ejection fraction does not improve, consider CRT for underlying left bundle branch block.  Coronary artery disease: Patent recent stents with no new obstructive disease by cardiac cath this admission.  Continue dual antiplatelet therapy and atorvastatin . Now on increased dose of Imdur  60 mg daily.  Post-cath instructions.  Cardiac rehab.  Essential hypertension: Amlodipine  was switched to Entresto  after cardiac catheterization.  Monitor renal function closely and consider adding small dose spironolactone .  Hyperlipidemia: Last LDL 44.  Continue on atorvastatin  80, ezetimibe  10 mg daily.  PTA PCSK9 inhibitor on hold for hospitalization, resume at discharge.  Chronic kidney disease stage IIIb: Baseline serum creatinine around 1.3.  34 mL of contrast was used for cardiac catheterization.  Entresto  was added this admission and we will have to monitor renal function closely.  Longstanding history of COPD: Not currently in exacerbation and a former smoker.  Peripheral arterial disease/carotid artery stenosis: Patient has previously had intervention to the left  SFA most recent ABI in 09/2022 was stable.  Continued on DAPT and statin therapy.  Follow-up as outpatient.  Physical deconditioning: The patient has not been contacted by cardiac rehab.  Message has been sent.  She does benefit from home health to help with heart failure management as a new diagnosis.  Her daughter lives in [place] .     For questions or updates, please contact [facility] Please consult [email or web address] for contact info under       Signed, Bernardino Bring, PA-C  07/26/2024, 11:30 AM    "

## 2024-07-26 NOTE — TOC Progression Note (Addendum)
 Transition of Care [facility]) - Progression Note    Patient Details  Name: Sarah Payne MRN: [id number] Date of Birth: 01-Sep-1945  [facility] CM/SW Contact  Shasta DELENA Daring, RN Phone Number: 07/26/2024, 10:36 AM  Clinical Narrative:    Patient lives with husband in single family home. Patient drives self to appointments Will call family or friends for a ride when necessary. [facility] on [address] is pharmacy. Denies any difficulty affording medications. Does not currently have HH services. Is agreeable to starting. TOC will initiate search. DME in home includes walker, shower chair, cane, bedside commode.  Daughter at bedside and will provide transportation home.  Requested walker from [employer] at patient request.   Expected Discharge Plan: Home/Self Care Barriers to Discharge: No Barriers Identified               Expected Discharge Plan and Services   Discharge Planning Services: CM Consult   Living arrangements for the past 2 months: Single Family Home                         Representative spoke with at DME Agency: Patient cannot remember name of oxygen company - in [place]             Social Drivers of Health (SDOH) Interventions SDOH Screenings   Food Insecurity: No Food Insecurity (07/23/2024)  Housing: Low Risk (07/23/2024)  Transportation Needs: No Transportation Needs (07/23/2024)  Utilities: Not At Risk (07/23/2024)  Alcohol Screen: Low Risk (10/22/2023)  Depression (PHQ2-9): Medium Risk (06/24/2024)  Financial Resource Strain: Low Risk (10/22/2023)  Physical Activity: Insufficiently Active (10/22/2023)  Social Connections: Moderately Isolated (07/23/2024)  Stress: No Stress Concern Present (10/22/2023)  Tobacco Use: High Risk (07/25/2024)  Health Literacy: Adequate Health Literacy (10/22/2023)    Readmission Risk Interventions    07/22/2024   11:37 AM 07/22/2024   11:04 AM  Readmission Risk Prevention Plan  Transportation Screening Complete Complete  PCP  or Specialist Appt within 3-5 Days Complete Not Complete  HRI or Home Care Consult Complete Complete  Social Work Consult for Recovery Care Planning/Counseling Complete Complete  Palliative Care Screening Not Applicable Not Applicable  Medication Review Oceanographer) Complete Complete

## 2024-07-26 NOTE — Discharge Summary (Signed)
 " Physician Discharge Summary   Patient: Sarah Payne MRN: [id number] DOB: [birth year]  Admit date:     07/21/2024  Discharge date: 07/26/2024  Discharge Physician: Amaryllis Dare   PCP: Gretta Comer MARLA, NP   Recommendations at discharge:  Please obtain CBC and BMP on follow-up Follow-up with primary care provider Follow-up with cardiology  Discharge Diagnoses: Principal Problem:   Acute respiratory failure with hypoxia [facility]) Active Problems:   Dizziness   Acute systolic heart failure (HCC)   CAP (community acquired pneumonia)   Uncontrolled type 2 diabetes mellitus with hypoglycemia, with long-term current use of insulin  (HCC)   History of CAD (coronary artery disease)   COPD (chronic obstructive pulmonary disease) (HCC)   CKD (chronic kidney disease), stage III (HCC)   Hyperlipidemia LDL goal <70   Hypothyroidism   Hypertension   Chronic pain syndrome   Demand ischemia (HCC)   Elevated troponin level  Resolved Problems:   * No resolved hospital problems. [facility] Course: Partly taken from prior notes.   SABREA Sarah Payne is a pleasant 79 y.o. female with medical history significant for CAD s/p recent PCI, COPD on 2 L home oxygen, CHF, diabetes, GERD, HTN, HLD, hypothyroidism who came into ED complaining of shortness of breath that has gotten worse over 1 to 2 days.  Per EMT report patient was desaturating in 70s and was placed on 1 breather.  On presentation patient was hypoxic up to 85% on nonrebreather, she was placed on BiPAP.  Blood pressure was elevated at 204/143, tachycardic and tachypneic.  Labs with leukocytosis at 15.9,proBNP was 8644, bicarb was 20, creatinine 1.67, anion gap of 16, Troponin was 162, COVID flu and RSV negative.  Chest x-ray showed flash pulmonary edema.   Patient was given IV Lasix  and was admitted for concern of acute on chronic hypoxic respiratory failure with pulmonary edema.  Echocardiogram was obtained which shows a new diagnosis of HFrEF with  EF of 20 to 25%, prior EF of 55%.  Cardiology was consulted and they were recommending continuation of IV diuresis, will get right and left cardiac cath on Monday.  Patient also received antibiotics for concern of pneumonia with leukocytosis and procalcitonin of 4.19.  Although 2 L of home oxygen was mentioned everywhere, per patient she was not on any oxygen at home.  She was able to wean back to room air.  1/4: Hemodynamically stable, now on room air.  Mild hypokalemia which is being repleted, stable renal function, net negative of about 4 L.  1/5: Remained hemodynamically stable, cardiac cath today with no ischemic reason for new diagnosis of HFrEF.  Did had patent prior stents and subtotally occluded vessels with left-to-right collaterals which were unchanged. Cardiology is going to start low-dose Entresto  and will introduce p.o. diuretic from tomorrow.  1/6: Patient remained hemodynamically stable, improving creatinine to 1.53 which is around her baseline.  Patient was started on p.o. Lasix , cardiology made some more changes to add GDMT to her medications which they will continue as outpatient.  Patient is being discharged on current medications and need to have a close follow-up with her providers for further assistance.  Assessment and Plan: * Acute respiratory failure with hypoxia (HCC) Initially required BiPAP and nonrebreather, likely secondary to pulmonary edema with acute HFrEF.  Now on room air.  Home use of 2 L mentioned in her chart but according to patient she was not using any oxygen at home.  - Continue to monitor and use oxygen  if needed  Acute systolic heart failure (HCC) Echocardiogram with new reduced EF of 20 to 25% with global hypokinesis, prior EF was 55 to 60%.  Cardiology is on board. Elevated proBNP. Right and left cath was without any significant new ischemic changes which can explain new diagnosis of HFrEF Patient received IV diuresis while in the hospital,  will give her a break of 1 day due to increasing creatinine which started improving, she is being discharged on p.o. Lasix . -Patient was also started on low-dose Entresto  - Rest of GDMT to be added by cardiology slowly  CAP (community acquired pneumonia) Some concern of pneumonia with leukocytosis and procalcitonin of 4.19. Blood cultures remain negative. - Patient completed a course of antibiotics before discharge. -Continue with supportive care  Uncontrolled type 2 diabetes mellitus with hypoglycemia, with long-term current use of insulin  (HCC) Blood glucose elevated. Patient received insulin  while in the hospital and will resume home medications on discharge.  History of CAD (coronary artery disease) S/p PCI.  Mildly elevated troponin likely secondary to demand ischemia with pulmonary edema and HFrEF. No chest pain. - Continue home aspirin  and Plavix  -Continuing home statin and Imdur  - Cardiac cath today was negative for any new abnormality.  Did had prior significant disease.  COPD (chronic obstructive pulmonary disease) (HCC) No wheezing today. -As needed bronchodilator  CKD (chronic kidney disease), stage III (HCC) Seems like underlying CKD stage IIIb. Creatinine within baseline with slight worsening.  IV Lasix  was discontinued -Monitor renal function while she is being diuresed -Avoid nephrotoxins  Hyperlipidemia LDL goal <70 - Continue home Lipitor   Hypertension Home amlodipine  was discontinued and low-dose Entresto  was added. - Cardiology to introduce GDMT slowly  Hypothyroidism - Continue home Synthroid   Consultants: Cardiology Procedures performed: Right and left cardiac catheterization Disposition: Home health Diet recommendation:  Cardiac and Carb modified diet DISCHARGE MEDICATION: Allergies as of 07/26/2024   No Known Allergies      Medication List     STOP taking these medications    amLODipine  10 MG tablet Commonly known as: NORVASC         TAKE these medications    acetaminophen  650 MG CR tablet Commonly known as: TYLENOL  Take 1,300 mg by mouth every 8 (eight) hours as needed for pain.   albuterol  108 (90 Base) MCG/ACT inhaler Commonly known as: VENTOLIN  HFA Inhale 1-2 puffs into the lungs every 6 (six) hours as needed for wheezing or shortness of breath.   aspirin  EC 81 MG tablet Take 1 tablet (81 mg total) by mouth daily. Swallow whole.   atorvastatin  80 MG tablet Commonly known as: LIPITOR  Take 1 tablet (80 mg total) by mouth daily. for cholesterol.   clobetasol  cream 0.05 % Commonly known as: TEMOVATE  Apply 1 Application topically 2 (two) times daily as needed. For vaginal itching.   clopidogrel  75 MG tablet Commonly known as: PLAVIX  Take 1 tablet (75 mg total) by mouth daily.   dapagliflozin  propanediol 10 MG Tabs tablet Commonly known as: FARXIGA  Take 1 tablet (10 mg total) by mouth daily.   ezetimibe  10 MG tablet Commonly known as: ZETIA  Take 1 tablet (10 mg total) by mouth daily. For cholesterol.   feeding supplement (GLUCERNA SHAKE) Liqd Take 237 mLs by mouth 3 (three) times daily between meals.   FreeStyle Libre 2 Reader [employer] Use with sensor to check blood sugars 6 times daily.   FreeStyle Libre 2 Sensor Misc USE TO CHECK BLOOD SUGAR CHANGE EVERY 14 DAYS   furosemide  40 MG tablet  Commonly known as: LASIX  Take 1 tablet (40 mg total) by mouth every Monday, Wednesday, and Friday. Start taking on: July 27, 2024 What changed:  medication strength how much to take when to take this reasons to take this additional instructions   gabapentin  300 MG capsule Commonly known as: NEURONTIN  Take 1 capsule (300 mg total) by mouth daily. May take an additional dose throughout the day if needed for pain What changed:  when to take this reasons to take this additional instructions   Gvoke HypoPen  2-Pack 1 MG/0.2ML Soaj Generic drug: Glucagon  Inject 1 mg (1 pen) as needed for severe low blood  sugar (sustained glucose less than 55 despite oral glucose treatments). May repeat in 15 minutes as needed.   Insupen Pen Needles 32G X 4 MM Misc Generic drug: Insulin  Pen Needle Use to inject insulin  4 times daily.   isosorbide  mononitrate 60 MG 24 hr tablet Commonly known as: IMDUR  Take 1 tablet (60 mg total) by mouth daily. Start taking on: July 27, 2024 What changed:  medication strength how much to take   levothyroxine  100 MCG tablet Commonly known as: SYNTHROID  TAKE 1 TABLET BY MOUTH IN THE MORNING ON  AN  EMPTY  STOMACH  WITH  WATER   ONLY.  NO  FOOD  OR  OTHER  MEDICATIONS  FOR  30  MINUTES   metoprolol  succinate 50 MG 24 hr tablet Commonly known as: TOPROL -XL Take 1 tablet (50 mg total) by mouth daily. Take with or immediately following a meal.   mirtazapine  15 MG tablet Commonly known as: REMERON  Take 1 tablet (15 mg total) by mouth at bedtime. For sleep What changed:  when to take this reasons to take this additional instructions   nicotine  polacrilex 2 MG lozenge Commonly known as: Nicotine  Mini Take 1 lozenge (2 mg total) by mouth every 2 (two) hours as needed for smoking cessation.   nitroGLYCERIN  0.4 MG SL tablet Commonly known as: Nitrostat  Place 1 tablet (0.4 mg total) under the tongue every 5 (five) minutes as needed for chest pain.   NovoLOG  FlexPen 100 UNIT/ML FlexPen Generic drug: insulin  aspart INJECT 5 UNITS SUBCUTANEOUSLY THREE TIMES DAILY WITH MEALS PER SLIDING SCALE (FOR DIABETES)   ondansetron  4 MG tablet Commonly known as: ZOFRAN  Take 1 tablet (4 mg total) by mouth every 6 (six) hours as needed for nausea.   potassium chloride  10 MEQ tablet Commonly known as: KLOR-CON  M Take 10 mEq by mouth every Monday, Wednesday, and Friday.   Repatha  SureClick 140 MG/ML Soaj Generic drug: Evolocumab  Inject 140 mg into the skin every 14 (fourteen) days.   sacubitril -valsartan  24-26 MG Commonly known as: ENTRESTO  Take 1 tablet by mouth 2 (two)  times daily.   sitaGLIPtin  100 MG tablet Commonly known as: Januvia  Take 1 tablet (100 mg total) by mouth daily. for diabetes.   umeclidinium-vilanterol 62.5-25 MCG/ACT Aepb Commonly known as: Anoro Ellipta  Inhale 1 puff into the lungs daily.               Durable Medical Equipment  (From admission, onward)           Start     Ordered   07/26/24 1000  For home use only DME Walker rolling  Once       Question Answer Comment  Walker: With 5 Inch Wheels   Patient needs a walker to treat with the following condition Generalized weakness      07/26/24 0959  Follow-up Information     Gretta Comer POUR, NP. Schedule an appointment as soon as possible for a visit in 1 week(s).   Specialty: Internal Medicine Contact information: [address] [place] [address] [phone]         Perla Evalene PARAS, MD. Schedule an appointment as soon as possible for a visit in 1 week(s).   Specialty: Cardiology Contact information: [address] [place] [address] [phone]                Discharge Exam: Fredricka Weights   07/25/24 0500 07/25/24 1053 07/26/24 0509  Weight: 59.9 kg 59.9 kg 59 kg   General.  Frail elderly lady, in no acute distress. Pulmonary.  Lungs clear bilaterally, normal respiratory effort. CV.  Regular rate and rhythm, no JVD, rub or murmur. Abdomen.  Soft, nontender, nondistended, BS positive. CNS.  Alert and oriented .  No focal neurologic deficit. Extremities.  No edema, no cyanosis, pulses intact and symmetrical. Psychiatry.  Judgment and insight appears normal.   Condition at discharge: stable  The results of significant diagnostics from this hospitalization (including imaging, microbiology, ancillary and laboratory) are listed below for reference.   Imaging Studies: CARDIAC CATHETERIZATION Result Date: 07/25/2024   Mid RCA to Dist RCA lesion is 30% stenosed.   Mid LAD to Dist LAD lesion is 40%  stenosed.   Prox Cx to Dist Cx lesion is 30% stenosed.   RPDA-1 lesion is 90% stenosed.   RPDA-2 lesion is 50% stenosed.   1st Diag lesion is 55% stenosed.   1st Mrg lesion is 70% stenosed.   Dist Cx lesion is 50% stenosed.   Prox RCA to Mid RCA lesion is 30% stenosed.   Non-stenotic Dist RCA lesion was previously treated.   Non-stenotic Prox LAD to Mid LAD lesion was previously treated.   Non-stenotic RPAV lesion was previously treated. 1.  Patent LAD and RCA stents with no significant change since most recent PCI.  The ostial right PDA is subtotally occluded and jailed by previous stents with faint left-to-right collaterals.  This is unchanged. 2.  Left ventricular angiography was not performed due to chronic kidney disease.  EF was moderately severely reduced by echo. 3.  Right heart catheterization showed normal right and left-sided filling pressures, mild pulmonary hypertension and normal cardiac output. Recommendations: Drop in ejection fraction does not seem to be due to an ischemic etiology.  Recommend continuing medical therapy of coronary artery disease. Regarding heart failure, I elected to stop amlodipine  and introduce a small dose of Entresto  to see if she can tolerate.  Recommend starting a small dose oral diuretic tomorrow.   ECHOCARDIOGRAM COMPLETE Result Date: 07/21/2024    ECHOCARDIOGRAM REPORT   Patient Name:   Sarah Payne Date of Exam: 07/21/2024 Medical Rec #:  [id number]     Height:       66.0 in Accession #:    [id number]    Weight:       119.7 lb Date of Birth:  [birth year]     BSA:          1.608 m Patient Age:    78 years      BP:           138/99 mmHg Patient Gender: F             HR:           85 bpm. Exam Location:  [facility] Procedure: 2D Echo, 3D  Echo, Cardiac Doppler, Color Doppler and Strain Analysis            (Both Spectral and Color Flow Doppler were utilized during            procedure). Indications:     CHF-acute diastolic I50.31  History:         Patient has prior history of  Echocardiogram examinations, most                  recent 10/16/2022. CHF, CAD and Previous Myocardial Infarction,                  COPD; Arrythmias:LBBB.  Sonographer:     Christopher Furnace Referring Phys:  [id number] EVALENE JINNY LUNGER Diagnosing Phys: Evalene Lunger MD  Sonographer Comments: Global longitudinal strain was attempted. IMPRESSIONS  1. Left ventricular ejection fraction, by estimation, is 25 to 30%. The left ventricle has severely decreased function. The left ventricle demonstrates global hypokinesis. Left ventricular diastolic parameters are consistent with Grade I diastolic dysfunction (impaired relaxation). The average left ventricular global longitudinal strain is -7.5 %. The global longitudinal strain is abnormal.  2. Right ventricular systolic function is normal. The right ventricular size is normal.  3. The mitral valve is normal in structure. Mild to moderate mitral valve regurgitation. No evidence of mitral stenosis.  4. The aortic valve is normal in structure. Aortic valve regurgitation is not visualized. Aortic valve sclerosis is present, with no evidence of aortic valve stenosis.  5. The inferior vena cava is normal in size with greater than 50% respiratory variability, suggesting right atrial pressure of 3 mmHg. FINDINGS  Left Ventricle: Left ventricular ejection fraction, by estimation, is 25 to 30%. The left ventricle has severely decreased function. The left ventricle demonstrates global hypokinesis. The average left ventricular global longitudinal strain is -7.5 %. Strain was performed and the global longitudinal strain is abnormal. The left ventricular internal cavity size was normal in size. There is no left ventricular hypertrophy. Left ventricular diastolic parameters are consistent with Grade I diastolic dysfunction (impaired relaxation). Right Ventricle: The right ventricular size is normal. No increase in right ventricular wall thickness. Right ventricular systolic function is normal. Left  Atrium: Left atrial size was normal in size. Right Atrium: Right atrial size was normal in size. Pericardium: There is no evidence of pericardial effusion. Mitral Valve: The mitral valve is normal in structure. Mild to moderate mitral valve regurgitation. No evidence of mitral valve stenosis. MV peak gradient, 4.8 mmHg. The mean mitral valve gradient is 2.0 mmHg. Tricuspid Valve: The tricuspid valve is normal in structure. Tricuspid valve regurgitation is not demonstrated. No evidence of tricuspid stenosis. Aortic Valve: The aortic valve is normal in structure. Aortic valve regurgitation is not visualized. Aortic valve sclerosis is present, with no evidence of aortic valve stenosis. Aortic valve mean gradient measures 2.0 mmHg. Aortic valve peak gradient measures 3.5 mmHg. Aortic valve area, by VTI measures 2.58 cm. Pulmonic Valve: The pulmonic valve was normal in structure. Pulmonic valve regurgitation is not visualized. No evidence of pulmonic stenosis. Aorta: The aortic root is normal in size and structure. Venous: The inferior vena cava is normal in size with greater than 50% respiratory variability, suggesting right atrial pressure of 3 mmHg. IAS/Shunts: No atrial level shunt detected by color flow Doppler. Additional Comments: 3D was performed not requiring image post processing on an independent workstation and was indeterminate.  LEFT VENTRICLE PLAX 2D LVIDd:         5.40 cm  Diastology LVIDs:         5.00 cm      LV e' medial:    2.50 cm/s LV PW:         1.10 cm      LV E/e' medial:  26.6 LV IVS:        1.00 cm      LV e' lateral:   5.87 cm/s LVOT diam:     2.10 cm      LV E/e' lateral: 11.3 LV SV:         48 LV SV Index:   30           2D Longitudinal Strain LVOT Area:     3.46 cm     2D Strain GLS Avg:     -7.5 %  LV Volumes (MOD) LV vol d, MOD A2C: 83.3 ml LV vol d, MOD A4C: 151.0 ml LV vol s, MOD A2C: 79.2 ml LV vol s, MOD A4C: 112.0 ml LV SV MOD A2C:     4.1 ml LV SV MOD A4C:     151.0 ml LV SV  MOD BP:      21.0 ml RIGHT VENTRICLE RV Basal diam:  2.70 cm RV Mid diam:    2.70 cm LEFT ATRIUM             Index        RIGHT ATRIUM           Index LA diam:        3.20 cm 1.99 cm/m   RA Area:     11.50 cm LA Vol (A2C):   19.5 ml 12.13 ml/m  RA Volume:   25.40 ml  15.80 ml/m LA Vol (A4C):   13.8 ml 8.58 ml/m LA Biplane Vol: 16.5 ml 10.26 ml/m  AORTIC VALVE AV Area (Vmax):    2.34 cm AV Area (Vmean):   2.23 cm AV Area (VTI):     2.58 cm AV Vmax:           93.00 cm/s AV Vmean:          61.000 cm/s AV VTI:            0.185 m AV Peak Grad:      3.5 mmHg AV Mean Grad:      2.0 mmHg LVOT Vmax:         62.90 cm/s LVOT Vmean:        39.300 cm/s LVOT VTI:          0.138 m LVOT/AV VTI ratio: 0.75  AORTA Ao Root diam: 3.20 cm MITRAL VALVE               TRICUSPID VALVE MV Area (PHT): 3.54 cm    TR Peak grad:   8.8 mmHg MV Area VTI:   1.71 cm    TR Vmax:        148.00 cm/s MV Peak grad:  4.8 mmHg MV Mean grad:  2.0 mmHg    SHUNTS MV Vmax:       1.09 m/s    Systemic VTI:  0.14 m MV Vmean:      73.1 cm/s   Systemic Diam: 2.10 cm MV Decel Time: 214 msec MV E velocity: 66.60 cm/s MV A velocity: 82.30 cm/s MV E/A ratio:  0.81 Evalene Lunger MD Electronically signed by Evalene Lunger MD Signature Date/Time: 07/21/2024/2:11:45 PM    Final    DG Chest Portable 1 View Result Date: 07/21/2024 EXAM:  1 VIEW(S) XRAY OF THE CHEST 07/21/2024 02:51:00 AM COMPARISON: 04/26/2024 CLINICAL HISTORY: acute dyspnea with hypoxia, suspect CHF FINDINGS: LINES, TUBES AND DEVICES: Stable thoracic spinal stimulator device. LUNGS AND PLEURA: Diffuse bilateral interstitial infiltrate with superimposed extensive airspace infiltrate within the perihilar and infrahilar right lung is in keeping with moderate-to-severe, asymmetric pulmonary edema. No pleural effusion. No pneumothorax. HEART AND MEDIASTINUM: Mild cardiomegaly. Calcified aortic atherosclerosis. BONES AND SOFT TISSUES: Left shoulder arthroplasty. No acute osseous abnormality.  IMPRESSION: 1. Moderate-to-severe asymmetric pulmonary edema, more severe within the right lung. 2. Mild cardiomegaly. 3. Stable thoracic spinal stimulator device. 4. Left shoulder arthroplasty. Electronically signed by: Dorethia Molt MD 07/21/2024 03:27 AM EST RP Workstation: [id number]   CARDIAC CATHETERIZATION Result Date: 07/01/2024   Mid LAD to Dist LAD lesion is 40% stenosed.   Prox Cx to Dist Cx lesion is 30% stenosed.   Dist Cx lesion is 40% stenosed.   RPAV lesion is 80% stenosed.   1st Mrg lesion is 70% stenosed.   1st Diag lesion is 55% stenosed.   Prox LAD to Mid LAD lesion is 90% stenosed.   RPDA-1 lesion is 90% stenosed.   RPDA-2 lesion is 50% stenosed.   Dist RCA lesion is 90% stenosed.   Prox RCA to Mid RCA lesion is 40% stenosed.   Mid RCA to Dist RCA lesion is 30% stenosed.   A drug-eluting stent was successfully placed using a STENT SYNERGY XD 3.50X20.   A drug-eluting stent was successfully placed using a STENT ONYX FRONTIER 2.5X34.   A drug-eluting stent was successfully placed using a STENT ONYX FRONTIER [id number].   Post intervention, there is a 0% residual stenosis.   Post intervention, there is a 0% residual stenosis.   Post intervention, there is a 0% residual stenosis.   In the absence of any other complications or medical issues, we expect the patient to be ready for discharge from an interventional cardiology perspective on 07/01/2024.   Recommend dual antiplatelet therapy with Aspirin  81mg  daily and Clopidogrel  75mg  daily. 1.  Successful OCT guided PCI with intravascular lithotripsy and drug-eluting stent placement to the proximal LAD. 2.  Successful PCI and 2 overlapped drug-eluting stents to the right AV groove and distal right coronary artery.  Extremely difficult and challenging procedure due to difficulty crossing with a wire and balloons. Recommendations: Continue lifelong dual antiplatelet therapy as tolerated. This was a complex procedure and required 180 mL of contrast.  Will  hydrate overnight and check renal function in the morning.    Microbiology: Results for orders placed or performed during the hospital encounter of 07/21/24  Resp panel by RT-PCR (RSV, Flu A&B, Covid) Anterior Nasal Swab     Status: None   Collection Time: 07/21/24  2:43 AM   Specimen: Anterior Nasal Swab  Result Value Ref Range Status   SARS Coronavirus 2 by RT PCR NEGATIVE NEGATIVE Final    Comment: (NOTE) SARS-CoV-2 target nucleic acids are NOT DETECTED.  The SARS-CoV-2 RNA is generally detectable in upper respiratory specimens during the acute phase of infection. The lowest concentration of SARS-CoV-2 viral copies this assay can detect is 138 copies/mL. A negative result does not preclude SARS-Cov-2 infection and should not be used as the sole basis for treatment or other patient management decisions. A negative result may occur with  improper specimen collection/handling, submission of specimen other than nasopharyngeal swab, presence of viral mutation(s) within the areas targeted by this assay, and inadequate number of viral copies(<138 copies/mL). A negative result  must be combined with clinical observations, patient history, and epidemiological information. The expected result is Negative.  Fact Sheet for Patients:  bloggercourse.com  Fact Sheet for Healthcare Providers:  seriousbroker.it  This test is no t yet approved or cleared by the [place]  FDA and  has been authorized for detection and/or diagnosis of SARS-CoV-2 by FDA under an Emergency Use Authorization (EUA). This EUA will remain  in effect (meaning this test can be used) for the duration of the COVID-19 declaration under Section 564(b)(1) of the Act, 21 U.S.C.section 360bbb-3(b)(1), unless the authorization is terminated  or revoked sooner.       Influenza A by PCR NEGATIVE NEGATIVE Final   Influenza B by PCR NEGATIVE NEGATIVE Final    Comment:  (NOTE) The Xpert Xpress SARS-CoV-2/FLU/RSV plus assay is intended as an aid in the diagnosis of influenza from Nasopharyngeal swab specimens and should not be used as a sole basis for treatment. Nasal washings and aspirates are unacceptable for Xpert Xpress SARS-CoV-2/FLU/RSV testing.  Fact Sheet for Patients: bloggercourse.com  Fact Sheet for Healthcare Providers: seriousbroker.it  This test is not yet approved or cleared by the [place]  FDA and has been authorized for detection and/or diagnosis of SARS-CoV-2 by FDA under an Emergency Use Authorization (EUA). This EUA will remain in effect (meaning this test can be used) for the duration of the COVID-19 declaration under Section 564(b)(1) of the Act, 21 U.S.C. section 360bbb-3(b)(1), unless the authorization is terminated or revoked.     Resp Syncytial Virus by PCR NEGATIVE NEGATIVE Final    Comment: (NOTE) Fact Sheet for Patients: bloggercourse.com  Fact Sheet for Healthcare Providers: seriousbroker.it  This test is not yet approved or cleared by the [place]  FDA and has been authorized for detection and/or diagnosis of SARS-CoV-2 by FDA under an Emergency Use Authorization (EUA). This EUA will remain in effect (meaning this test can be used) for the duration of the COVID-19 declaration under Section 564(b)(1) of the Act, 21 U.S.C. section 360bbb-3(b)(1), unless the authorization is terminated or revoked.  Performed at [facility], [address] [place], [place] [address]   Culture, blood (Routine X 2) w Reflex to ID Panel     Status: None (Preliminary result)   Collection Time: 07/22/24  7:42 PM   Specimen: BLOOD  Result Value Ref Range Status   Specimen Description BLOOD LEFT ANTECUBITAL  Final   Special Requests   Final    BOTTLES DRAWN AEROBIC AND ANAEROBIC Blood Culture adequate volume    Culture   Final    NO GROWTH 4 DAYS Performed at [facility], [address] [place], [place] [address]    Report Status PENDING  Incomplete  Culture, blood (Routine X 2) w Reflex to ID Panel     Status: None (Preliminary result)   Collection Time: 07/22/24  7:47 PM   Specimen: BLOOD  Result Value Ref Range Status   Specimen Description BLOOD BLOOD RIGHT HAND  Final   Special Requests   Final    BOTTLES DRAWN AEROBIC AND ANAEROBIC Blood Culture results may not be optimal due to an inadequate volume of blood received in culture bottles   Culture   Final    NO GROWTH 4 DAYS Performed at [facility], [address] [place], [place] [address]    Report Status PENDING  Incomplete   *Note: Due to a large number of results and/or encounters for the requested time period, some results have not been displayed.  A complete set of results can be found in Results Review.    Labs: CBC: Recent Labs  Lab 07/21/24 0243 07/22/24 0348 07/23/24 0230 07/24/24 0447 07/25/24 0447 07/25/24 1424 07/25/24 1430  WBC 15.9* 17.3* 11.3* 7.8 7.9  --   --   NEUTROABS 8.2*  --   --   --   --   --   --   HGB 13.8 11.5* 10.3* 10.4* 10.3* 10.5* 10.5*  HCT 43.2 35.5* 31.6* 31.5* 31.8* 31.0* 31.0*  MCV 92.1 90.6 90.5 89.7 92.2  --   --   PLT 404* 326 296 283 287  --   --    Basic Metabolic Panel: Recent Labs  Lab 07/22/24 0348 07/23/24 0226 07/24/24 0447 07/25/24 0447 07/25/24 1424 07/25/24 1430 07/26/24 0417  NA 136 137 139 139 136 136 139  K 3.8 3.4* 3.4* 3.5 3.7 3.9 4.0  CL 97* 97* 99 99  --   --  99  CO2 24 28 29 29   --   --  29  GLUCOSE 226* 175* 155* 162*  --   --  198*  BUN 22 25* 27* 26*  --   --  23  CREATININE 1.47* 1.58* 1.57* 1.68*  --   --  1.53*  CALCIUM  9.0 8.6* 8.6* 8.9  --   --  8.9  MG 2.0  --   --   --   --   --   --    Liver Function Tests: Recent Labs  Lab 07/21/24 0243 07/22/24 0348  AST 24 136*  ALT 14 19  ALKPHOS 128* 85  BILITOT 0.3 0.4   PROT 7.6 6.2*  ALBUMIN 3.9 3.4*   CBG: Recent Labs  Lab 07/25/24 0811 07/25/24 1218 07/25/24 1614 07/25/24 2237 07/26/24 0757  GLUCAP 216* 251* 202* 295* 250*    Discharge time spent: greater than 30 minutes.  This record has been created using Conservation officer, historic buildings. Errors have been sought and corrected,but may not always be located. Such creation errors do not reflect on the standard of care.   Signed: Amaryllis Dare, MD [facility] 07/26/2024 "

## 2024-07-26 NOTE — TOC Transition Note (Signed)
 Transition of Care [facility]) - Discharge Note   Patient Details  Name: Sarah Payne MRN: [id number] Date of Birth: December 25, 1945  [facility]) CM/SW Contact:  Shasta DELENA Daring, RN Phone Number: 07/26/2024, 12:06 PM   Clinical Narrative:    Patient has discharge orders. Patient selected [facility] health with PT and RN services.  Rolling walker ordered from [employer]. Notified agency rep. Confirmed order received by tech.  No additional TOC needs. RNCM Signing off.    Final next level of care: Home w Home Health Services Barriers to Discharge: Barriers Resolved   Patient Goals and CMS Choice            Discharge Placement                    Patient and family notified of of transfer: 07/26/24  Discharge Plan and Services Additional resources added to the After Visit Summary for     Discharge Planning Services: CM Consult                    Representative spoke with at DME Agency: Patient cannot remember name of oxygen company - in [place] HH Arranged: PT, RN [facility] Agency: [facility] Date [facility] Agency Contacted: 07/26/24 Time HH Agency Contacted: 1206    Social Drivers of Health (SDOH) Interventions SDOH Screenings   Food Insecurity: No Food Insecurity (07/23/2024)  Housing: Low Risk (07/23/2024)  Transportation Needs: No Transportation Needs (07/23/2024)  Utilities: Not At Risk (07/23/2024)  Alcohol Screen: Low Risk (10/22/2023)  Depression (PHQ2-9): Medium Risk (06/24/2024)  Financial Resource Strain: Low Risk (10/22/2023)  Physical Activity: Insufficiently Active (10/22/2023)  Social Connections: Moderately Isolated (07/23/2024)  Stress: No Stress Concern Present (10/22/2023)  Tobacco Use: High Risk (07/25/2024)  Health Literacy: Adequate Health Literacy (10/22/2023)     Readmission Risk Interventions    07/22/2024   11:37 AM 07/22/2024   11:04 AM  Readmission Risk Prevention Plan  Transportation Screening Complete Complete  PCP or Specialist Appt  within 3-5 Days Complete Not Complete  HRI or Home Care Consult Complete Complete  Social Work Consult for Recovery Care Planning/Counseling Complete Complete  Palliative Care Screening Not Applicable Not Applicable  Medication Review Oceanographer) Complete Complete

## 2024-07-26 NOTE — Progress Notes (Signed)
 Heart Failure Stewardship Pharmacy Note  PCP: Gretta Comer POUR, NP PCP-Cardiologist: Evalene Lunger, MD  HPI: Sarah Payne is a 79 y.o. female with CAD s/p recent PCI, COPD on 2 L home oxygen, CHF, diabetes, CKD stage 3, GERD, HTN, HLD, hypothyroidism who presented with shortness of breath that has gotten worse over 1 to 2 days. On admission, proBNP was 8644, HS-troponin was 162>3076>2880, bicarb 20, anion gap 16, AlkPhos 128, and WBC 15.9. Chest x-ray noted mod to severe asymmetric pulmonary edema, more severe within the right lung and calcified aortic atherosclerosis. TTE 07/2024 noted LVEF 25-30% with global hypokinesis, grade I diastolic dysfunction, mild to mod mitral valve regurgitation.  Pertinent cardiac history: 01/2011 PCI performed with DES to mid Lcx. In 08/2011 PCI with DES to proximal distal RCA. In 09/2011 PCI with DES to proximal Lcx. In 07/2012 PCI with DES to proximal RCA. TTE 08/2013 with LVEF of 55-60%. TTE 06/017 showed LVEf down to 25-30% with severe focal basal hypertrophy of the septum with akinesis of the mid-apicalanteroseptal, lateral, inferior, inferoseptal, and apical myocardium consistent with Takotsubo cardiomyopathy. TTE 03/2016 showed LVEF improved to 60-65%. LHC 04/2024 showed significant 3 vessel disease and evaluation for CABG was recommended. Underwent PCI with DES to proximal PAD, right AV groove, and distal RCA. LVEF was stable on multiple echoes prior to to this admission where TTE showed LVEF of 25-30%, G1DD, mild to moderate MR.   RHC/LHC 07/25/24  1.  Patent LAD and RCA stents with no significant change since most recent PCI.  The ostial right PDA is subtotally occluded and jailed by previous stents with faint left-to-right collaterals.  This is unchanged. 2.  Left ventricular angiography was not performed due to chronic kidney disease.  EF was moderately severely reduced by echo. 3.  Right heart catheterization showed normal right and left-sided filling  pressures, mild pulmonary hypertension and normal cardiac output.  Pertinent Lab Values: Creat  Date Value Ref Range Status  04/04/2016 1.06 (H) 0.50 - 0.99 mg/dL Final    Comment:      For patients > or = 77 years of age: The upper reference limit for Creatinine is approximately 13% higher for people identified as African-American.      Creatinine, Ser  Date Value Ref Range Status  07/26/2024 1.53 (H) 0.44 - 1.00 mg/dL Final   BUN  Date Value Ref Range Status  07/26/2024 23 8 - 23 mg/dL Final  87/97/7974 17 8 - 27 mg/dL Final  98/86/7983 15 7 - 18 mg/dL Final   Potassium  Date Value Ref Range Status  07/26/2024 4.0 3.5 - 5.1 mmol/L Final  08/02/2014 3.1 (L) 3.5 - 5.1 mmol/L Final   Sodium  Date Value Ref Range Status  07/26/2024 139 135 - 145 mmol/L Final  06/21/2024 138 134 - 144 mmol/L Final  08/02/2014 136 136 - 145 mmol/L Final   B Natriuretic Peptide  Date Value Ref Range Status  04/26/2024 262.5 (H) 0.0 - 100.0 pg/mL Final    Comment:    Performed at [facility], [address] [place], [place] [address]   Magnesium   Date Value Ref Range Status  07/22/2024 2.0 1.7 - 2.4 mg/dL Final    Comment:    Performed at [facility], [address] [place], [place] [address]  06/12/2014 1.1 (L) mg/dL Final    Comment:    8.1-7.5 THERAPEUTIC RANGE: 4-7 mg/dL TOXIC: > 10 mg/dL  ----------------------- MAGNESIUM  - Slight hemolysis, interpret results with  -  caution.    Hemoglobin A1C  Date Value Ref Range Status  06/24/2024 8.0 (A) 4.0 - 5.6 % Final  08/02/2014 10.9 (H) 4.2 - 6.3 % Final    Comment:    The American Diabetes Association recommends that a primary goal of therapy should be <7% and that physicians should reevaluate the treatment regimen in patients with HbA1c values consistently >8%.    Hgb A1c MFr Bld  Date Value Ref Range Status  03/24/2024 9.2 (H) 4.6 - 6.5 % Final    Comment:    Glycemic Control Guidelines for  People with Diabetes:Non Diabetic:  <6%Goal of Therapy: <7%Additional Action Suggested:  >8%    TSH  Date Value Ref Range Status  03/24/2024 1.21 0.35 - 5.50 uIU/mL Final    Vital Signs: Temp:  [97.6 F (36.4 C)-98.4 F (36.9 C)] 98.3 F (36.8 C) (01/06 0755) Pulse Rate:  [58-74] 66 (01/06 0755) Cardiac Rhythm: Junctional rhythm (01/05 1900) Resp:  [13-26] 18 (01/06 0755) BP: (110-144)/(63-80) 133/63 (01/06 0755) SpO2:  [93 %-99 %] 97 % (01/06 0755) Weight:  [59 kg (130 lb 1.1 oz)-59.9 kg (132 lb 0.9 oz)] 59 kg (130 lb 1.1 oz) (01/06 0509) No intake or output data in the 24 hours ending 07/26/24 9178   Current Heart Failure Medications:  Loop diuretic: none Beta-Blocker: metoprolol  succ 50 mg daily ACEI/ARB/ARNI: Entresto  24/26 mg BID (started 07/25/24) MRA: none SGLT2i: Jardiance 10 mg daily (started 07/26/24) Other: isosorbide  mononitrate 60 mg daily (increased from 30 mg 07/26/24), amlodipine  10 mg (stopped 07/25/24)  Prior to admission Heart Failure Medications:  Loop diuretic: furosemide  20 mg MWF Beta-Blocker: metoprolol  succ 50 mg daily ACEI/ARB/ARNI: none MRA: none SGLT2i: none Other: amlodipine  10 mg daily, isosorbide  mononitrate 30 mg daily  Assessment: 1. 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 NYHA class III symptoms.   Cardiac catheterization 07/25/24: MD suspects drop in EF not due to ischemic etiology. Recommends continuing medical therapy of CAD. RHC showed normal right and left-sided filling pressures, mild pulmonary HTN, and normal cardiac output.  -Symptoms: Reports SOB, chest pain, and orthopnea have resolved.  -Volume: Euvolemic per cardiac catheterization results.   -Hemodynamics: BP is slightly elevated. HR 60s. -BB: Continue metoprolol  succinate 50 mg daily. Can consider optimizing to  target dose 200 mg daily in future pending HR. -ACEI/ARB/ARNI: Continue Entresto  24/26 mg BID. Uptrending K 3.5>4.0 since initiating, Scr elevated but stable. Continue to monitor renal function. -MRA: May be a good candidate for spironolactone , can consider adding 12.5 mg daily in near future pending renal function. -SGLT2i: Agree with addition of Farxiga    Plan: 1) Medication changes recommended at this time: - Agree with addition of Farxiga   - None at this time, can consider adding low dose spironolactone  in near future pending renal function.  2) Patient assistance: - Test claim for sacubitril -valsartan  24-26 mg current 30 day co-pay is $61.13. - Test claim for Farxiga  10 mg current 30 day co-pay is $103.34. - Test claim for Jardiance 10 mg current 30 day co-pay is $98.51.  3) Education: - Patient has been educated on current HF medications and potential additions to HF medication regimen - Patient verbalizes understanding that over the next few months, these medication doses may change and more medications may be added to optimize HF regimen - Patient has been educated on basic disease state pathophysiology and goals  of therapy  Calton Nash, [place] PharmD Candidate

## 2024-07-26 NOTE — Plan of Care (Signed)
" °  Problem: Education: Goal: Ability to describe self-care measures that may prevent or decrease complications (Diabetes Survival Skills Education) will improve Outcome: Progressing   Problem: Coping: Goal: Ability to adjust to condition or change in health will improve Outcome: Progressing   Problem: Fluid Volume: Goal: Ability to maintain a balanced intake and output will improve Outcome: Progressing   Problem: Health Behavior/Discharge Planning: Goal: Ability to identify and utilize available resources and services will improve Outcome: Progressing Goal: Ability to manage health-related needs will improve Outcome: Progressing   Problem: Metabolic: Goal: Ability to maintain appropriate glucose levels will improve Outcome: Progressing   Problem: Nutritional: Goal: Maintenance of adequate nutrition will improve Outcome: Progressing Goal: Progress toward achieving an optimal weight will improve Outcome: Progressing   Problem: Skin Integrity: Goal: Risk for impaired skin integrity will decrease Outcome: Progressing   Problem: Tissue Perfusion: Goal: Adequacy of tissue perfusion will improve Outcome: Progressing   Problem: Education: Goal: Ability to demonstrate management of disease process will improve Outcome: Progressing Goal: Ability to verbalize understanding of medication therapies will improve Outcome: Progressing Goal: Individualized Educational Video(s) Outcome: Progressing   Problem: Activity: Goal: Capacity to carry out activities will improve Outcome: Progressing   Problem: Cardiac: Goal: Ability to achieve and maintain adequate cardiopulmonary perfusion will improve Outcome: Progressing   Problem: Education: Goal: Knowledge of General Education information will improve Description: Including pain rating scale, medication(s)/side effects and non-pharmacologic comfort measures Outcome: Progressing   Problem: Health Behavior/Discharge Planning: Goal:  Ability to manage health-related needs will improve Outcome: Progressing   Problem: Clinical Measurements: Goal: Ability to maintain clinical measurements within normal limits will improve Outcome: Progressing Goal: Will remain free from infection Outcome: Progressing Goal: Diagnostic test results will improve Outcome: Progressing Goal: Respiratory complications will improve Outcome: Progressing Goal: Cardiovascular complication will be avoided Outcome: Progressing   Problem: Activity: Goal: Risk for activity intolerance will decrease Outcome: Progressing   Problem: Nutrition: Goal: Adequate nutrition will be maintained Outcome: Progressing   Problem: Coping: Goal: Level of anxiety will decrease Outcome: Progressing   Problem: Elimination: Goal: Will not experience complications related to bowel motility Outcome: Progressing Goal: Will not experience complications related to urinary retention Outcome: Progressing   Problem: Pain Managment: Goal: General experience of comfort will improve and/or be controlled Outcome: Progressing   Problem: Safety: Goal: Ability to remain free from injury will improve Outcome: Progressing   Problem: Skin Integrity: Goal: Risk for impaired skin integrity will decrease Outcome: Progressing   Problem: Education: Goal: Understanding of CV disease, CV risk reduction, and recovery process will improve Outcome: Progressing Goal: Individualized Educational Video(s) Outcome: Progressing   Problem: Activity: Goal: Ability to return to baseline activity level will improve Outcome: Progressing   Problem: Cardiovascular: Goal: Ability to achieve and maintain adequate cardiovascular perfusion will improve Outcome: Progressing Goal: Vascular access site(s) Level 0-1 will be maintained Outcome: Progressing   Problem: Health Behavior/Discharge Planning: Goal: Ability to safely manage health-related needs after discharge will  improve Outcome: Progressing   "

## 2024-07-26 NOTE — Plan of Care (Signed)
  Problem: Nutritional: Goal: Maintenance of adequate nutrition will improve Outcome: Progressing   Problem: Clinical Measurements: Goal: Respiratory complications will improve Outcome: Progressing Goal: Cardiovascular complication will be avoided Outcome: Progressing   Problem: Activity: Goal: Risk for activity intolerance will decrease Outcome: Progressing

## 2024-07-26 NOTE — Telephone Encounter (Signed)
 Patient Product/process Development Scientist completed.    The patient is insured through [employer].     Ran test claim for Repatha  Sureclick 140mg  and the current 30 day co-pay is $137.24.   This test claim was processed through [facility]- copay amounts may vary at other pharmacies due to pharmacy/plan contracts, or as the patient moves through the different stages of their insurance plan.     Reyes Sharps, CPHT Pharmacy Technician Patient Advocate Specialist Lead [facility] Patient Advocate Team Direct Number: [phone]  Fax: [phone]

## 2024-07-26 NOTE — Evaluation (Signed)
 Physical Therapy Evaluation Patient Details Name: Sarah Payne MRN: [id number] DOB: August 19, 1945 Today's Date: 07/26/2024  History of Present Illness  Pt is a 79 y/o F admitted on 07/21/24 after presenting with c/o worsening SOB. Pt is being treated for acute respiratory failure with hypoxia, acute systolic heart failure, CAP. Pt is s/o heart cath on 07/25/24. PMH: CAD s/p recent PCI, COPD on 2L home O2, CHF, DM, GERD, HTN, HLD, hypothyroidism  Clinical Impression  Pt seen for PT evaluation with pt agreeable, daughter present in room. Pt reports prior to admission she was living with husband in home with laundry area in basement, ambulatory with PRN use of rollator, denies falls. On this date, pt is able to ambulate around unit without AD with CGA<>very close supervision 2/2 decreased balance, negotiate stairs with R rail & mod I. Pt would benefit from ongoing PT services to address balance to reduce fall risk & for BLE strengthening. Encouraged pt to use RW at home to reduce fall risk & pt/family agreeable.        If plan is discharge home, recommend the following: A little help with bathing/dressing/bathroom;A little help with walking and/or transfers;Assistance with cooking/housework;Assist for transportation;Help with stairs or ramp for entrance   Can travel by private vehicle        Equipment Recommendations Rolling walker (2 wheels)  Recommendations for Other Services       Functional Status Assessment Patient has had a recent decline in their functional status and demonstrates the ability to make significant improvements in function in a reasonable and predictable amount of time.     Precautions / Restrictions Precautions Precautions: Fall Restrictions Weight Bearing Restrictions Per Provider Order: No      Mobility  Bed Mobility               General bed mobility comments: not tested, pt received in recliner, left sitting EOB    Transfers Overall transfer level:  Modified independent Equipment used: None               General transfer comment: sit>stand from recliner, stand>sit on EOB    Ambulation/Gait Ambulation/Gait assistance: Contact guard assist, Supervision Gait Distance (Feet): 220 Feet Assistive device: None Gait Pattern/deviations: Decreased step length - left, Decreased step length - right, Decreased stride length, Step-through pattern, Decreased dorsiflexion - right, Decreased dorsiflexion - left Gait velocity: decreased     General Gait Details: cuing re: need to focus on increased heel strike BLE with fair return demo, pt with lateral sway L<>R, pt's daughter reports she always walks like she's drunk & has LOB but uses wall to correct herself, denies falls  Stairs Stairs: Yes Stairs assistance: Modified independent (Device/Increase time) Stair Management: One rail Right (R ascending rail) Number of Stairs: 6 (6)    Wheelchair Mobility     Tilt Bed    Modified Rankin (Stroke Patients Only)       Balance Overall balance assessment: Needs assistance Sitting-balance support: Feet supported Sitting balance-Leahy Scale: Good     Standing balance support: During functional activity, No upper extremity supported Standing balance-Leahy Scale: Fair                               Pertinent Vitals/Pain Pain Assessment Pain Assessment: No/denies pain    Home Living Family/patient expects to be discharged to:: Private residence Living Arrangements: Spouse/significant other;Children Available Help at Discharge: Family (daughter planning to stay with  pt the rest of this week, son lives nearby) Type of Home: House Home Access: Stairs to enter Entrance Stairs-Rails: Right Entrance Stairs-Number of Steps: 2   Home Layout: One level;Laundry or work area in [employer]: Rollator (4 wheels);Cane - single point;Grab bars - tub/shower;Grab bars - toilet;Tub bench;BSC/3in1;Standard Walker       Prior Function Prior Level of Function : Independent/Modified Independent             Mobility Comments: ambulatory without AD, denies falls ADLs Comments: bathing & dressing without assistance     Extremity/Trunk Assessment   Upper Extremity Assessment Upper Extremity Assessment: Overall WFL for tasks assessed    Lower Extremity Assessment Lower Extremity Assessment: Generalized weakness;Overall [facility] for tasks assessed    Cervical / Trunk Assessment Cervical / Trunk Assessment: Normal  Communication   Communication Communication: No apparent difficulties    Cognition Arousal: Alert Behavior During Therapy: WFL for tasks assessed/performed   PT - Cognitive impairments: No apparent impairments                         Following commands: Intact       Cueing Cueing Techniques: Verbal cues     General Comments General comments (skin integrity, edema, etc.): VSS    Exercises Other Exercises Other Exercises: Pt performs 5x sit<>stand without BUE support from EOB with focus on BLE strengthening, cuing to decrease posterior lean onto EOB with BLE.   Assessment/Plan    PT Assessment Patient needs continued PT services  PT Problem List Decreased strength;Decreased activity tolerance;Decreased balance;Decreased mobility;Decreased knowledge of use of DME       PT Treatment Interventions DME instruction;Therapeutic exercise;Gait training;Balance training;Stair training;Neuromuscular re-education;Functional mobility training;Patient/family education;Therapeutic activities    PT Goals (Current goals can be found in the Care Plan section)  Acute Rehab PT Goals Patient Stated Goal: d/c home PT Goal Formulation: With patient/family Time For Goal Achievement: 08/09/24 Potential to Achieve Goals: Good    Frequency Min 2X/week     Co-evaluation               AM-PAC PT 6 Clicks Mobility  Outcome Measure Help needed turning from your back to your  side while in a flat bed without using bedrails?: None Help needed moving from lying on your back to sitting on the side of a flat bed without using bedrails?: None Help needed moving to and from a bed to a chair (including a wheelchair)?: None Help needed standing up from a chair using your arms (e.g., wheelchair or bedside chair)?: None Help needed to walk in hospital room?: A Little Help needed climbing 3-5 steps with a railing? : A Little 6 Click Score: 22    End of Session   Activity Tolerance: Patient tolerated treatment well Patient left: in bed;with call bell/phone within reach;with family/visitor present (sitting EOB) Nurse Communication: Mobility status PT Visit Diagnosis: Unsteadiness on feet (R26.81);Muscle weakness (generalized) (M62.81)    Time: 9058-9043 PT Time Calculation (min) (ACUTE ONLY): 15 min   Charges:   PT Evaluation $PT Eval Low Complexity: 1 Low   PT General Charges $$ ACUTE PT VISIT: 1 Visit         Richerd Pinal, PT, DPT 07/26/2024, 10:03 AM    Richerd CHRISTELLA Pinal 07/26/2024, 10:01 AM

## 2024-07-26 NOTE — Progress Notes (Signed)
 Heart Failure Nurse Navigator Progress Note  PCP: Gretta Comer POUR, NP PCP-Cardiologist: Evalene Lunger, MD  Admission Diagnosis: Acute respiratory failure with hypoxia (HCC) Acute on chronic congestive heart failure, unspecified heart failure type [facility]) Demand ischemia (HCC) Elevated troponin level  Admitted from: Home  Presentation:   Sarah Payne is a 79 y.o. female. She presented with shortness of breath.  and indigestion.  She recently had a respiratory virus for several days.  Had 3 Cardiac Stents 3 weeks prior.  Arrived to the ED on 15L with O2 Sats 85%.  Past medical history COPD, CHF.  BP was 204/143, Pulse 120 Respirations 35. [phone]. HS-Troponin-162. Chest x-ray: Moderate-to-severe asymmetric pulmonary edema, more severe within the right lung.Mild cardiomegaly.Stable thoracic spinal stimulator device.  ECHO/ LVEF: 25-30%  Clinical Course:  Past Medical History:  Diagnosis Date   Acute pain of left shoulder 01/17/2019   Aortic atherosclerosis    CHF (congestive heart failure) (HCC) 09/16/2013   a.) TTE 09/16/2013: EF 55-60%; mod inferior HK, triv TR; G1DD. b.) TTE 01/02/2016: EF 25-30%; mid-apicalateroseptal, lat, inf, inferoseptal, apical akinesis; triv TR; G1DD. c. TTE 04/16/2016: EF 65%; G1DD. d.) TTE 05/02/2020: EF 50-55%; G2DD. e.) TTE 04/11/2021: EF 55%; G1DD.   Chronic low back pain with bilateral sciatica    Chronic painful diabetic neuropathy (HCC)    Chronic radicular lumbar pain    CKD (chronic kidney disease), stage III (HCC)    Clostridioides difficile infection 2015   COPD (chronic obstructive pulmonary disease) (HCC)    Coronary artery disease    a.) PCI 02/06/2011: 100% mLCx - DES x 3. b.) PCI 09/17/2011: 95% dRCA - DES x 1. c.) PCI 10/02/2011: 75% pLCX - DES x 1. d.) PCI 08/19/2012: DES x 2 p-mRCA.  e.) LHC 01/01/16: Takotsubo event 01/01/2016 with patent stents   Depression    Diverticulosis    Failed back syndrome    Frequent PVCs    a. Noted  in hospital 12/2015.   GERD (gastroesophageal reflux disease)    History of bilateral cataract extraction 01/2015   History of heart artery stent    a.) TOTAL of 7 stents --> 02/06/2011 - overlapping 2.5 x 12 mm Xience V, 2.5 x 8 mm Xience V, 2.25 x 8 mm Xience Nano to mLCx; 09/17/2011 - 2.5 x 23 Xience V dRCA; 10/02/2011 - 2.5 x 15 mm Xience V pLCx; 08/09/2012 - overlapping 3.0 x 38mm mRCA and 3.5 x 23mm pRCA   Hyperlipidemia    Hypertension    Hypothyroidism    Impingement syndrome of left shoulder    LBBB (left bundle branch block)    Long term current use of clopidogrel     Marijuana abuse    Myocardial infarction [facility])    a.) multiple MIs;  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 12/2015 - nephew committed suicide 1 week prior, sister died the morning of presentation - initially called a STEMI; cath with patent stents. LVEF 25-30%.  Tendonitis of left rotator cuff    Tobacco abuse    Vaginal burning 03/22/2019   Vascular dementia      Social History   Socioeconomic History   Marital status: Married    Spouse name: Laurier   Number of children: Not on file   Years of education: Not on file   Highest education level: Not on file  Occupational History   Not on file  Tobacco Use   Smoking status: Every Day    Current packs/day: 1.00    Average packs/day: 1 pack/day for 45.0 years (45.0 ttl pk-yrs)    Types: Cigarettes   Smokeless tobacco: Never   Tobacco comments:    Has cut back, trying to quit. 1 pack/day per patient.  Vaping Use   Vaping status: Some  Days   Substances: Nicotine   Substance and Sexual Activity   Alcohol use: No    Alcohol/week: 0.0 standard drinks of alcohol   Drug use: Not Currently   Sexual activity: Not on file  Other Topics Concern   Not on file  Social History Narrative   Lives at home with her husband in [place].  Previously used marijuana - quit.      Regular exercise: no/ pain from a frozen rotator cuff   Caffeine use: coffee daily and pepsi      Does not have a living will.   Daughters and husband know her wishes- would desire CPR but not prolonged life support if futile   Social Drivers of Health   Tobacco Use: High Risk (07/25/2024)   Patient History    Smoking Tobacco Use: Every Day    Smokeless Tobacco Use: Never    Passive Exposure: Not on file  Financial Resource Strain: Low Risk (10/22/2023)   Overall Financial Resource Strain (CARDIA)    Difficulty of Paying Living Expenses: Not hard at all  Food Insecurity: No Food Insecurity (07/23/2024)   Epic    Worried About Programme Researcher, Broadcasting/film/video in the Last Year: Never true    Ran Out of Food in the Last Year: Never true  Transportation Needs: No Transportation Needs (07/23/2024)   Epic    Lack of Transportation (Medical): No    Lack of Transportation (Non-Medical): No  Physical Activity: Insufficiently Active (10/22/2023)   Exercise Vital Sign    Days of Exercise per Week: 3 days    Minutes of Exercise per Session: 20 min  Stress: No Stress Concern Present (10/22/2023)   [employer] of Occupational Health - Occupational Stress Questionnaire    Feeling of Stress : Only a little  Social Connections: Moderately Isolated (07/23/2024)   Social Connection and Isolation Panel    Frequency of Communication with Friends and Family: More than three times a week    Frequency of Social Gatherings with Friends and Family: Once a week    Attends Religious Services: Never    Database Administrator or Organizations: No    Attends Banker Meetings:  Never    Marital Status: Married  Depression (PHQ2-9): Medium Risk (06/24/2024)   Depression (PHQ2-9)    PHQ-2 Score: 10  Alcohol Screen: Low Risk (10/22/2023)   Alcohol Screen    Last Alcohol Screening Score (AUDIT): 0  Housing: Low Risk (07/23/2024)   Epic    Unable to Pay for Housing in the Last Year: No    Number of Times Moved in the Last Year: 0    Homeless in the Last Year: No  Utilities: Not  At Risk (07/23/2024)   Epic    Threatened with loss of utilities: No  Health Literacy: Adequate Health Literacy (10/22/2023)   B1300 Health Literacy    Frequency of need for help with medical instructions: Never  Education Assessment and Provision:  Detailed education and instructions provided on heart failure disease management including the following:  Signs and symptoms of Heart Failure When to call the physician Importance of daily weights Low sodium diet Fluid restriction Medication management Anticipated future follow-up appointments  Patient education given on each of the above topics.  Patient acknowledges understanding via teach back method and acceptance of all instructions.  Education Materials:  Living Better With Heart Failure Booklet, HF zone tool, & Daily Weight Tracker Tool.  Patient has scale at home: Yes Patient has pill box at home: Yes    High Risk Criteria for Readmission and/or Poor Patient Outcomes: Heart failure hospital admissions (last 6 months): 1  No Show rate: 15% Difficult social situation: None Determined Demonstrates medication adherence: Yes Primary Language: English Literacy level: Reading, Writing & Comprehension  Barriers of Care:   Daily Weights Diet & Fluid Restrictions  Considerations/Referrals:  Referral made to Heart Failure Pharmacist Stewardship: Yes Referral made to Heart Failure CSW/NCM TOC: No Referral made to Heart & Vascular TOC clinic: [facility] 08/12/24 @ 3:30 PM  Items for Follow-up on DC/TOC: Daily Weights Diet &  Fluid Restrictions Continued Heart Failure Education  Charmaine Pines, RN, BSN [facility] Heart Failure Navigator Secure Chat Only

## 2024-07-27 ENCOUNTER — Telehealth: Payer: Self-pay | Admitting: *Deleted

## 2024-07-27 LAB — CULTURE, BLOOD (ROUTINE X 2)
Culture: NO GROWTH
Culture: NO GROWTH
Special Requests: ADEQUATE

## 2024-07-27 NOTE — Transitions of Care (Post Inpatient/ED Visit) (Signed)
 "  07/27/2024  Name: Sarah Payne MRN: [id number] DOB: 1946/02/23  Today's TOC FU Call Status: Today's TOC FU Call Status:: Successful TOC FU Call Completed TOC FU Call Complete Date: 07/27/24  Patient's Name and Date of Birth confirmed. Name, DOB  Transition Care Management Follow-up Telephone Call Date of Discharge: 07/26/24 Discharge Facility: [facility]) Type of Discharge: Inpatient Admission Primary Inpatient Discharge Diagnosis:: Acute respiratory failure with hypoxia How have you been since you were released from the hospital?: Better Any questions or concerns?: Yes Patient Questions/Concerns:: Patient daughter is wanting to know if any of the new medications could cause that. Patient Questions/Concerns Addressed:  (RN went over the medications. Daughter will monitor and see if the Farxiga  is a possible side effect)  Items Reviewed: Did you receive and understand the discharge instructions provided?: Yes Medications obtained,verified, and reconciled?: Yes (Medications Reviewed) Any new allergies since your discharge?: No Dietary orders reviewed?: No Do you have support at home?: Yes People in Home [RPT]: child(ren), adult Name of Support/Comfort Primary Source: Melanie  Medications Reviewed Today: Medications Reviewed Today     Reviewed by Kennieth Cathlean DEL, RN (Case Manager) on 07/27/24 at 1153  Med List Status: <None>   Medication Order Taking? Sig Documenting Provider Last Dose Status Informant  acetaminophen  (TYLENOL ) 650 MG CR tablet [id number] Yes Take 1,300 mg by mouth every 8 (eight) hours as needed for pain. [provider]  Active Self  albuterol  (VENTOLIN  HFA) 108 (90 Base) MCG/ACT inhaler [id number] Yes Inhale 1-2 puffs into the lungs every 6 (six) hours as needed for wheezing or shortness of breath. [provider]  Active Self  aspirin  EC 81 MG tablet [id number] Yes Take 1 tablet (81 mg total) by mouth daily.  Swallow whole. Darron Deatrice LABOR, MD  Active Self  atorvastatin  (LIPITOR ) 80 MG tablet [id number] Yes Take 1 tablet (80 mg total) by mouth daily. for cholesterol. Clark, Katherine K, NP  Active Self  clobetasol  cream (TEMOVATE ) 0.05 % [id number] Yes Apply 1 Application topically 2 (two) times daily as needed. For vaginal itching. Clark, Katherine K, NP  Active Self  clopidogrel  (PLAVIX ) 75 MG tablet [id number] Yes Take 1 tablet (75 mg total) by mouth daily. Darron Deatrice LABOR, MD  Active Self  Continuous Glucose Receiver (FREESTYLE LIBRE 2 READER) DEVI [id number] Yes Use with sensor to check blood sugars 6 times daily. Gretta Comer MARLA, NP  Active Self  Continuous Glucose Sensor (FREESTYLE LIBRE 2 SENSOR) [place] [id number] Yes USE TO CHECK BLOOD SUGAR CHANGE EVERY 14 DAYS Clark, Katherine K, NP  Active Self  dapagliflozin  propanediol (FARXIGA ) 10 MG TABS tablet [id number] Yes Take 1 tablet (10 mg total) by mouth daily. Caleen Qualia, MD  Active   Evolocumab  (REPATHA  SURECLICK) 140 MG/ML EMMANUEL [id number] Yes Inject 140 mg into the skin every 14 (fourteen) days. Darron Deatrice LABOR, MD  Active   ezetimibe  (ZETIA ) 10 MG tablet [id number] Yes Take 1 tablet (10 mg total) by mouth daily. For cholesterol. Clark, Katherine K, NP  Active Self  feeding supplement, GLUCERNA SHAKE, (GLUCERNA SHAKE) LIQD [id number]  Take 237 mLs by mouth 3 (three) times daily between meals.  Patient not taking: Reported on 07/27/2024   Amin, Sumayya, MD  Active   furosemide  (LASIX ) 40 MG tablet [id number] Yes Take 1 tablet (40 mg total) by mouth every Monday, Wednesday, and Friday. Caleen Qualia, MD  Active   gabapentin  (NEURONTIN ) 300 MG capsule [id number]  Take 1 capsule (300 mg total) by  mouth daily. May take an additional dose throughout the day if needed for pain  Patient taking differently: Take 300 mg by mouth daily as needed (Pain).   Gretta Comer POUR, NP  Active Self  Glucagon  (GVOKE HYPOPEN  2-PACK) 1 MG/0.2ML EMMANUEL [id number] Yes Inject  1 mg (1 pen) as needed for severe low blood sugar (sustained glucose less than 55 despite oral glucose treatments). May repeat in 15 minutes as needed. Gretta Comer POUR, NP  Active Self  insulin  aspart (NOVOLOG  FLEXPEN) 100 UNIT/ML FlexPen [id number] Yes INJECT 5 UNITS SUBCUTANEOUSLY THREE TIMES DAILY WITH MEALS PER SLIDING SCALE (FOR DIABETES) Gretta Comer POUR, NP  Active Self  Insulin  Pen Needle (INSUPEN PEN NEEDLES) 32G X 4 MM MISC [id number] Yes Use to inject insulin  4 times daily. Clark, Katherine K, NP  Active Self  isosorbide  mononitrate (IMDUR ) 60 MG 24 hr tablet [id number] Yes Take 1 tablet (60 mg total) by mouth daily. Caleen Qualia, MD  Active   levothyroxine  (SYNTHROID ) 100 MCG tablet [id number] Yes TAKE 1 TABLET BY MOUTH IN THE MORNING ON  AN  EMPTY  STOMACH  WITH  WATER   ONLY.  NO  FOOD  OR  OTHER  MEDICATIONS  FOR  30  MINUTES Clark, Katherine K, NP  Active Self  metoprolol  succinate (TOPROL -XL) 50 MG 24 hr tablet [id number] Yes Take 1 tablet (50 mg total) by mouth daily. Take with or immediately following a meal. Henry Manuelita NOVAK, NP  Active Self  mirtazapine  (REMERON ) 15 MG tablet [id number] Yes Take 1 tablet (15 mg total) by mouth at bedtime. For sleep  Patient taking differently: Take 15 mg by mouth at bedtime as needed (sleep).   Clark, Katherine K, NP  Active Self  nicotine  polacrilex (NICOTINE  MINI) 2 MG lozenge [id number]  Take 1 lozenge (2 mg total) by mouth every 2 (two) hours as needed for smoking cessation.  Patient not taking: Reported on 07/27/2024   Isadora Hose, MD  Active Self  nitroGLYCERIN  (NITROSTAT ) 0.4 MG SL tablet [id number] Yes Place 1 tablet (0.4 mg total) under the tongue every 5 (five) minutes as needed for chest pain. Darron Deatrice LABOR, MD  Active Self  ondansetron  (ZOFRAN ) 4 MG tablet [id number] Yes Take 1 tablet (4 mg total) by mouth every 6 (six) hours as needed for nausea. Clark, Katherine K, NP  Active Self  potassium chloride  (KLOR-CON  M) 10 MEQ tablet  [id number] Yes Take 10 mEq by mouth every Monday, Wednesday, and Friday. [provider]  Active Self  sacubitril -valsartan  (ENTRESTO ) 24-26 MG [id number] Yes Take 1 tablet by mouth 2 (two) times daily. Amin, Sumayya, MD  Active   sitaGLIPtin  (JANUVIA ) 100 MG tablet [id number] Yes Take 1 tablet (100 mg total) by mouth daily. for diabetes. Gretta Comer POUR, NP  Active Self           Med Note CARALYN, LINDSAY R   Tue Oct 06, 2023  8:22 AM)    umeclidinium-vilanterol (ANORO ELLIPTA ) 62.5-25 MCG/ACT AEPB [id number] Yes Inhale 1 puff into the lungs daily. Isadora Hose, MD  Active Self  Med List Note Kerrin Eleanor SAUNDERS, CPhT 05/12/24 1346):              Home Care and Equipment/Supplies: Were Home Health Services Ordered?: Yes Name of Home Health Agency:: [facility] Has Agency set up a time to come to your home?: No EMR reviewed for Home Health Orders: Orders present/patient has not received call (refer to CM for follow-up) Any new equipment or medical supplies  ordered?: Yes Name of Medical supply agency?: [facility] Were you able to get the equipment/medical supplies?: Yes Do you have any questions related to the use of the equipment/supplies?: No  Functional Questionnaire: Do you need assistance with bathing/showering or dressing?: Yes Do you need assistance with meal preparation?: Yes Do you need assistance with eating?: No Do you have difficulty maintaining continence: Yes (Patient is incont of diarrhea) Do you need assistance with getting out of bed/getting out of a chair/moving?: Yes (uses a rollator walker) Do you have difficulty managing or taking your medications?: Yes (Daughter prepares)  Follow up appointments reviewed: PCP Follow-up appointment confirmed?: No MD Provider Line Number:[phone] Given: Yes (Daughte is calling for appointment) Specialist Hospital Follow-up appointment confirmed?: Yes Date of Specialist follow-up appointment?: 08/04/24 Follow-Up Specialty  Provider:: Deatrice Cage, MD Thursday Aug 04, 2024 9:40 AM/Tina Donette [id number] 2:30 heartcare Do you need transportation to your follow-up appointment?: No Do you understand care options if your condition(s) worsen?: Yes-patient verbalized understanding  SDOH Interventions Today    Flowsheet Row Most Recent Value  SDOH Interventions   Food Insecurity Interventions Intervention Not Indicated  Housing Interventions Intervention Not Indicated  Transportation Interventions Intervention Not Indicated, Patient Resources (Friends/Family)  Utilities Interventions Intervention Not Indicated  Depression Interventions/Treatment  Counseling    Goals Addressed             This Visit's Progress    VBCI Transitions of Care (TOC) Care Plan       Problems:  Recent Hospitalization for treatment of Acute respiratory failure with hypoxia Knowledge Deficit Related to Acute respiratory failure with hypoxia  Goal:  Over the next 30 days, the patient will not experience hospital readmission  Interventions:  Transitions of Care: Doctor Visits  - discussed the importance of doctor visits Referral to Longitudinal Nurse Case Manager for Ongoing follow-up  Patient Self Care Activities:  Attend all scheduled provider appointments Call pharmacy for medication refills 3-7 days in advance of running out of medications Call provider office for new concerns or questions  Notify RN Care Manager of TOC call rescheduling needs Participate in Transition of Care Program/Attend TOC scheduled calls Take medications as prescribed   Monitor weight and document to take to DR Monitor skin for breakdown with diarrhea  Plan:  An initial telephone outreach has been scheduled for: [id number] Follow up with provider re: diarrhea and Coreg  that was discontinued from medication list Next PCP appointment scheduled for: Patient daughter will call for appointment Telephone follow up appointment with care management team  member scheduled for: Davina Green 08/03/2024 1:00       Discussed and offered 30 day TOC program.  Patient consented.  The patient has been provided with contact information for the care management team and has been advised to call with any health -related questions or concerns.  The patient verbalized understanding with current plan of care.  The patient is directed to their insurance card regarding availability of benefits coverage  RN discussed incontinent care  Cathlean Headland BSN RN [facility] Health Care Management Coordinator [email or web address] Direct Dial: [phone]  Fax: [phone] Website: [email or web address]  "

## 2024-08-01 ENCOUNTER — Encounter: Payer: Self-pay | Admitting: Family

## 2024-08-01 ENCOUNTER — Ambulatory Visit: Admitting: Family

## 2024-08-01 ENCOUNTER — Telehealth: Payer: Self-pay | Admitting: Family

## 2024-08-01 ENCOUNTER — Other Ambulatory Visit
Admission: RE | Admit: 2024-08-01 | Discharge: 2024-08-01 | Disposition: A | Discharge status: Home / Self Care | Source: Ambulatory Visit | Attending: Family | Admitting: Family

## 2024-08-01 VITALS — BP 128/69 | HR 50 | Wt 131.2 lb

## 2024-08-01 DIAGNOSIS — N1832 Chronic kidney disease, stage 3b: Secondary | ICD-10-CM | POA: Diagnosis not present

## 2024-08-01 DIAGNOSIS — Z79899 Other long term (current) drug therapy: Secondary | ICD-10-CM | POA: Insufficient documentation

## 2024-08-01 DIAGNOSIS — Z794 Long term (current) use of insulin: Secondary | ICD-10-CM | POA: Insufficient documentation

## 2024-08-01 DIAGNOSIS — Z87891 Personal history of nicotine dependence: Secondary | ICD-10-CM | POA: Insufficient documentation

## 2024-08-01 DIAGNOSIS — R0602 Shortness of breath: Secondary | ICD-10-CM | POA: Diagnosis present

## 2024-08-01 DIAGNOSIS — G4719 Other hypersomnia: Secondary | ICD-10-CM | POA: Diagnosis not present

## 2024-08-01 DIAGNOSIS — I739 Peripheral vascular disease, unspecified: Secondary | ICD-10-CM | POA: Insufficient documentation

## 2024-08-01 DIAGNOSIS — I2510 Atherosclerotic heart disease of native coronary artery without angina pectoris: Secondary | ICD-10-CM | POA: Insufficient documentation

## 2024-08-01 DIAGNOSIS — I10 Essential (primary) hypertension: Secondary | ICD-10-CM | POA: Diagnosis not present

## 2024-08-01 DIAGNOSIS — Z7984 Long term (current) use of oral hypoglycemic drugs: Secondary | ICD-10-CM | POA: Diagnosis not present

## 2024-08-01 DIAGNOSIS — E039 Hypothyroidism, unspecified: Secondary | ICD-10-CM | POA: Diagnosis not present

## 2024-08-01 DIAGNOSIS — E1122 Type 2 diabetes mellitus with diabetic chronic kidney disease: Secondary | ICD-10-CM

## 2024-08-01 DIAGNOSIS — Z955 Presence of coronary angioplasty implant and graft: Secondary | ICD-10-CM | POA: Diagnosis not present

## 2024-08-01 DIAGNOSIS — G4730 Sleep apnea, unspecified: Secondary | ICD-10-CM | POA: Diagnosis not present

## 2024-08-01 DIAGNOSIS — I5022 Chronic systolic (congestive) heart failure: Secondary | ICD-10-CM

## 2024-08-01 DIAGNOSIS — I130 Hypertensive heart and chronic kidney disease with heart failure and stage 1 through stage 4 chronic kidney disease, or unspecified chronic kidney disease: Secondary | ICD-10-CM | POA: Insufficient documentation

## 2024-08-01 DIAGNOSIS — Z95820 Peripheral vascular angioplasty status with implants and grafts: Secondary | ICD-10-CM | POA: Diagnosis not present

## 2024-08-01 DIAGNOSIS — J449 Chronic obstructive pulmonary disease, unspecified: Secondary | ICD-10-CM | POA: Diagnosis not present

## 2024-08-01 DIAGNOSIS — I447 Left bundle-branch block, unspecified: Secondary | ICD-10-CM | POA: Diagnosis not present

## 2024-08-01 DIAGNOSIS — E785 Hyperlipidemia, unspecified: Secondary | ICD-10-CM | POA: Insufficient documentation

## 2024-08-01 LAB — BASIC METABOLIC PANEL WITH GFR
Anion gap: 10 (ref 5–15)
BUN: 25 mg/dL — ABNORMAL HIGH (ref 8–23)
CO2: 27 mmol/L (ref 22–32)
Calcium: 8.8 mg/dL — ABNORMAL LOW (ref 8.9–10.3)
Chloride: 102 mmol/L (ref 98–111)
Creatinine, Ser: 2.07 mg/dL — ABNORMAL HIGH (ref 0.44–1.00)
GFR, Estimated: 24 mL/min — ABNORMAL LOW
Glucose, Bld: 201 mg/dL — ABNORMAL HIGH (ref 70–99)
Potassium: 4.6 mmol/L (ref 3.5–5.1)
Sodium: 139 mmol/L (ref 135–145)

## 2024-08-01 NOTE — Patient Instructions (Signed)
 Medication Changes:  No medication changes today!  Lab Work:  Go over to the [facility]. Go pass the gift shop and have your blood work completed.   We will only call you if the results are abnormal or if the provider would like to make medication changes.  No news is good news.     Testing/Procedures:  Your provider has recommended that you have a home sleep study (Itamar Test).  We have provided you with the equipment in our office today. Please go ahead and download the app. DO NOT OPEN OR TAMPER WITH THE BOX UNTIL WE ADVISE YOU TO DO SO. Once insurance has approved the test our office will call you with PIN number and approval to proceed with testing. Once you have completed the test you just dispose of the equipment, the information is automatically uploaded to us  via blue-tooth technology. If your test is positive for sleep apnea and you need a home CPAP machine you will be contacted by Dr Dorine office [facility]) to set this up.  Your pin number is : [id number]    Follow-Up in: Please follow up with the [facility] in 3-4 weeks with Ellouise Class, FNP.   Thank you for choosing [facility].    At the [facility], you and your health needs are our priority. We have a designated team specialized in the treatment of Heart Failure. This Care Team includes your primary Heart Failure Specialized Cardiologist (physician), Advanced Practice Providers (APPs- Physician Assistants and Nurse Practitioners), and Pharmacist who all work together to provide you with the care you need, when you need it.   You may see any of the following providers on your designated Care Team at your next follow up:  Dr. Toribio Fuel Dr. Ezra Shuck Dr. Ria Commander Dr. Morene Brownie Ellouise Class, FNP Jaun Bash, RPH-CPP  Please be sure to bring in all your medications bottles to every appointment.   Need to Contact  Us :  If you have any questions or concerns before your next appointment please send us  a message through [place] or call our office at [phone].    TO LEAVE A MESSAGE FOR THE NURSE SELECT OPTION 2, PLEASE LEAVE A MESSAGE INCLUDING: YOUR NAME DATE OF BIRTH CALL BACK NUMBER REASON FOR CALL**this is important as we prioritize the call backs  YOU WILL RECEIVE A CALL BACK THE SAME DAY AS LONG AS YOU CALL BEFORE 4:00 PM

## 2024-08-01 NOTE — Progress Notes (Signed)
 "  Advanced Heart Failure Clinic Note   Referring Physician: 01/26 admission PCP: Gretta Comer POUR, NP Cardiologist: Evalene Lunger, MD   Chief Complaint: shortness of breath   HPI:  Sarah Payne is a 79 y.o. female with a past medical history of coronary artery disease status post PCI/DES to the distal RCA (08/2011), mid left circumflex (09/2011), PCI/DES x 2 to the RCA (07/2012), HFimpEF, peripheral arterial disease status post orbital atherectomy and drug-coated balloon angioplasty to the left SFA (08/2020), type 2 diabetes, CKD stage IIIb, intermittent left bundle branch block, hypertension, hyperlipidemia, COPD, hypothyroidism, orthostatic dizziness versus vertigo, and sleep apnea, who presents today for follow-up.   LHC in 07/2012 showed moderate mid LAD disease at the takeoff of the diagonal vessel, severe ostial to mid RCA disease, and an EF of 55% with FFR indicating severe disease involving the RCA which was treated successfully with PCI/DES x 2.   Admitted 12/2015 with stress induced cardiomyopathy. Echo at that time showed EF 25 to 30% with wall motion abnormality concerning for Takotsubo, G1 DD.  LHC 12/2015 showed patent stents. Follow-up echo 03/2016 showed normalization of LV systolic function with EF 60 to 65%, normal wall motion, and G1 DD.   Cardiac monitoring in 03/2021 showed predominant sinus rhythm with average heart rate of 80 bpm, 3 episodes of SVT with the longest lasting 7 beats, and rare atrial and ventricular ectopy.  No significant arrhythmias to explain symptoms of syncope.  Echo 03/2021 demonstrated EF of 55%, no RWMA, mild LVH, and G1 DD. Carotid artery ultrasound 03/2021 demonstrated 1 to 39% bilateral ICA stenoses with antegrade flow the 5 bilateral vertebral arteries and normal flow hemodynamics in the bilateral subclavian arteries.  Underwent shoulder arthroplasty 09/2021 with postoperative course complicated by acute onset shortness of breath and lethargy. Noted to  be hyperkalemic with potassium of 5.9 which improved with treatment. Chest x-ray showed mild pulmonary edema with bilateral pleural effusions with improvement with diuresis. Echocardiogram revealed LVEF 55 to 60%, wall motion abnormality possible secondary to LBBB, G1 DD, spironolactone  was held due to hypokalemia.  Imdur  was also discontinued at discharge.  Seen by cardiology for follow-up 08/2022 and noted recent stress due to conflict with daughter.  Given history of Takotsubo in the setting of acute family stressors in 2017 repeat echocardiogram was ordered to evaluate for recurrent stress-induced cardiomyopathy.  Echocardiogram completed in 09/2022 showed an EF of 55 to 60%, G1 DD, mild MR, Moderate LVH, wall motion abnormality consistent with LBBB.  Carotid ultrasound left lower extremity arterial duplex and ABIs were ordered and stable when compared to prior.  Seen in the ED 09/25 with elevated blood pressure readings of 220/110 at home associated with generalized headache x 1 week.  She also reported intermittent fleeting episodes of chest discomfort not related to exertion.  She was started on amlodipine  10 mg daily on discharge in addition to her PTA antihypertensive regimen.  She was provided Valium  for anxiety.    She was seen by cardiology 04/19/2024 with ongoing concern for elevated blood pressures for the past week.  Home blood pressures continue to run 160s to 180s systolic.  She also reported worsening dyspnea on exertion since elevated blood pressures.  She reported 1 fleeting episode of chest discomfort which was not associated with exertion and resolved spontaneously after a few minutes.  She was continued on amlodipine  10 mg daily, Lasix  10 mg 3 times per week, and Imdur  30 mg daily.  Her carvedilol  was increased to  12.5 mg twice daily she was counseled for an updated echocardiogram as scheduled for follow-up.  Cardiac catheterization 05/16/24. Showed significant diffuse three-vessel  coronary artery disease and normal LV systolic function. Right heart catheterization showed normal right and left-sided filling pressures, normal pulmonary pressure and normal cardiac output.  She had significant right radial artery spasm which made the procedure very difficult. The patient was referred to cardiothoracic surgery for evaluation of CABG but she was felt to be not a good candidate considering her comorbidities. Echocardiogram 05/24/24: EF of 50% with no significant valvular abnormalities. Carotid Doppler 11/25 showed mild nonobstructive disease bilaterally.  Seen by cardiology 06/21/24 and continued with significant chest pain with multivessel disease. Recommend proceeding with PCI at least of the LAD and likely the right coronary artery. Due to significant radial artery spasm with discomfort with previous cath, will plan on femoral access. Started her on clopidogrel  75 mg once daily.   Admitted 06/30/24 for cardiac catheterization with successful OTC guided PCI with intravascular lithotripsy and DES x 1 to proximal LAD as well as PCI/DES x 2 with overlapping stents to the right AV groove and distal right coronary artery.  Recommendations for long-term DAPT with aspirin /Plavix . Admitted overnight for IV hydration. Renal function stable the following morning. Ambulated with no issues. Telemetry with notable PVCs overnight. Added Toprol  50mg  daily at discharge   Admitted 07/21/24 due to worsening SOB over last 1-2 days. On presentation patient was hypoxic up to 85% on nonrebreather, placed on BiPAP. Blood pressure was elevated at 204/143, tachycardic and tachypneic. Labs with leukocytosis at 15.9, proBNP was 8644, bicarb was 20, creatinine 1.67, anion gap of 16, Troponin was 162, COVID flu and RSV negative. Chest x-ray showed flash pulmonary edema. IV diuresed. Echo 07/21/24: EF down to 20 to 25%, prior EF of 55%. Cardiology consulted. Antibiotics given for concern of pneumonia. Weaned off oxygen. R/ LHC  07/25/24: no ischemic reason for new diagnosis of HFrEF. Did had patent prior stents and subtotally occluded vessels with left-to-right collaterals which were unchanged. Low dose entresto  was started. Mildly elevated troponin thought to be due to demand ischemia.   She presents today, with her niece in person and her daughter via telephone, for initial TOC HF visit with a chief complaint of moderate shortness of breath with exertion. Has associated fatigue, abdominal distention (improving), snoring, reflux. Sleeping well on 1 pillow. Had sleep study done 2014 but has never worn CPAP and she's uncertain if she could tolerate it. Denies chest pain, palpitations, dizziness, pedal edema. Called this morning to mover up her new appointment to today because she was concerned that she was gaining weight. Her daughter, Andrea, does medications for the patient.   Last smoked cigarettes ~3 months. No alcohol, infrequent marijuana use. Eating ~ 1500 sodium / day, drinking 2L. Weighing at home. Has home health nurse coming starting tomorrow.   Review of Systems: [y] = yes, [ ]  = no   General: Weight gain [ ] ; Weight loss [ ] ; Anorexia [ ] ; Fatigue [ y]; Fever [ ] ; Chills [ ] ; Weakness [ ]   Cardiac: Chest pain/pressure [ ] ; Resting SOB [email or web address] ]; Exertional SOB [email or web address] ]; Orthopnea [ ] ; Pedal Edema [ ] ; Palpitations [ ] ; Syncope [ ] ; Presyncope [ ] ; Paroxysmal nocturnal dyspnea[ ]   Pulmonary: Cough [ ] ; Wheezing[ ] ; Hemoptysis[ ] ; Sputum [ ] ; Snoring [ y]  GI: Vomiting[ ] ; Dysphagia[ ] ; Melena[ ] ; Hematochezia [ ] ; Heartburny[ ] ; Abdominal pain [ ] ; Constipation [ ] ; Diarrhea [ ] ;  BRBPR [ ]   GU: Hematuria[ ] ; Dysuria [ ] ; Nocturia[ ]   Vascular: Pain in legs with walking [ ] ; Pain in feet with lying flat [ ] ; Non-healing sores [ ] ; Stroke [ ] ; TIA [ ] ; Slurred speech [ ] ;  Neuro: Headaches[ ] ; Vertigo[ ] ; Seizures[ ] ; Paresthesias[ ] ;Blurred vision [ ] ; Diplopia [ ] ; Vision changes [ ]   Ortho/Skin: Arthritis [ ] ; Joint pain  [ ] ; Muscle pain [ ] ; Joint swelling [ ] ; Back Pain [ ] ; Rash [ ]   Psych: Depression[ ] ; Anxiety[ ]   Heme: Bleeding problems [ ] ; Clotting disorders [ ] ; Anemia [ ]   Endocrine: Diabetes davis.dad ]; Thyroid  dysfunction[ y]   Past Medical History:  Diagnosis Date   Acute pain of left shoulder 01/17/2019   Aortic atherosclerosis    CHF (congestive heart failure) (HCC) 09/16/2013   a.) TTE 09/16/2013: EF 55-60%; mod inferior HK, triv TR; G1DD. b.) TTE 01/02/2016: EF 25-30%; mid-apicalateroseptal, lat, inf, inferoseptal, apical akinesis; triv TR; G1DD. c. TTE 04/16/2016: EF 65%; G1DD. d.) TTE 05/02/2020: EF 50-55%; G2DD. e.) TTE 04/11/2021: EF 55%; G1DD.   Chronic low back pain with bilateral sciatica    Chronic painful diabetic neuropathy (HCC)    Chronic radicular lumbar pain    CKD (chronic kidney disease), stage III (HCC)    Clostridioides difficile infection 2015   COPD (chronic obstructive pulmonary disease) (HCC)    Coronary artery disease    a.) PCI 02/06/2011: 100% mLCx - DES x 3. b.) PCI 09/17/2011: 95% dRCA - DES x 1. c.) PCI 10/02/2011: 75% pLCX - DES x 1. d.) PCI 08/19/2012: DES x 2 p-mRCA.  e.) LHC 01/01/16: Takotsubo event 01/01/2016 with patent stents   Depression    Diverticulosis    Failed back syndrome    Frequent PVCs    a. Noted in hospital 12/2015.   GERD (gastroesophageal reflux disease)    History of bilateral cataract extraction 01/2015   History of heart artery stent    a.) TOTAL of 7 stents --> 02/06/2011 - overlapping 2.5 x 12 mm Xience V, 2.5 x 8 mm Xience V, 2.25 x 8 mm Xience Nano to mLCx; 09/17/2011 - 2.5 x 23 Xience V dRCA; 10/02/2011 - 2.5 x 15 mm Xience V pLCx; 08/09/2012 - overlapping 3.0 x 38mm mRCA and 3.5 x 23mm pRCA   Hyperlipidemia    Hypertension    Hypothyroidism    Impingement syndrome of left shoulder    LBBB (left bundle branch block)    Long term current use of clopidogrel     Marijuana abuse    Myocardial infarction [facility])    a.) multiple MIs;  5  per her report   Nephrolithiasis    OSA (obstructive sleep apnea)    a.) mild; does not require nocturnal PAP therapy   Pancreatitis 09/19/2022   Paroxysmal SVT (supraventricular tachycardia) 05/08/2021   a.)  Zio patch 05/08/2021: 3 distinct SVT runs; fastest 5 beats at a rate of 146 bpm; longest 7 beats at rate of 134 bpm   Renal cyst, left    Stress at home    a.) husband has PTSD   T2DM (type 2 diabetes mellitus) (HCC)    Takotsubo cardiomyopathy    a. 12/2015 - nephew committed suicide 1 week prior, sister died the morning of presentation - initially called a STEMI; cath with patent stents. LVEF 25-30%.   Tendonitis of left rotator cuff    Tobacco abuse    Vaginal burning 03/22/2019   Vascular  dementia     Current Outpatient Medications  Medication Sig Dispense Refill   acetaminophen  (TYLENOL ) 650 MG CR tablet Take 1,300 mg by mouth every 8 (eight) hours as needed for pain.     albuterol  (VENTOLIN  HFA) 108 (90 Base) MCG/ACT inhaler Inhale 1-2 puffs into the lungs every 6 (six) hours as needed for wheezing or shortness of breath.     aspirin  EC 81 MG tablet Take 1 tablet (81 mg total) by mouth daily. Swallow whole.     atorvastatin  (LIPITOR ) 80 MG tablet Take 1 tablet (80 mg total) by mouth daily. for cholesterol. 90 tablet 2   clobetasol  cream (TEMOVATE ) 0.05 % Apply 1 Application topically 2 (two) times daily as needed. For vaginal itching. 30 g 0   clopidogrel  (PLAVIX ) 75 MG tablet Take 1 tablet (75 mg total) by mouth daily. 90 tablet 3   Continuous Glucose Receiver (FREESTYLE LIBRE 2 READER) DEVI Use with sensor to check blood sugars 6 times daily. 1 each 0   Continuous Glucose Sensor (FREESTYLE LIBRE 2 SENSOR) MISC USE TO CHECK BLOOD SUGAR CHANGE EVERY 14 DAYS 6 each 3   dapagliflozin  propanediol (FARXIGA ) 10 MG TABS tablet Take 1 tablet (10 mg total) by mouth daily. 30 tablet 2   Evolocumab  (REPATHA  SURECLICK) 140 MG/ML SOAJ Inject 140 mg into the skin every 14 (fourteen) days.  2 mL 2   ezetimibe  (ZETIA ) 10 MG tablet Take 1 tablet (10 mg total) by mouth daily. For cholesterol. 90 tablet 2   feeding supplement, GLUCERNA SHAKE, (GLUCERNA SHAKE) LIQD Take 237 mLs by mouth 3 (three) times daily between meals. (Patient not taking: Reported on 07/27/2024) 10000 mL 2   furosemide  (LASIX ) 40 MG tablet Take 1 tablet (40 mg total) by mouth every Monday, Wednesday, and Friday. 30 tablet 2   gabapentin  (NEURONTIN ) 300 MG capsule Take 1 capsule (300 mg total) by mouth daily. May take an additional dose throughout the day if needed for pain (Patient taking differently: Take 300 mg by mouth daily as needed (Pain).) 180 capsule 2   Glucagon  (GVOKE HYPOPEN  2-PACK) 1 MG/0.2ML SOAJ Inject 1 mg (1 pen) as needed for severe low blood sugar (sustained glucose less than 55 despite oral glucose treatments). May repeat in 15 minutes as needed. 0.4 mL 1   insulin  aspart (NOVOLOG  FLEXPEN) 100 UNIT/ML FlexPen INJECT 5 UNITS SUBCUTANEOUSLY THREE TIMES DAILY WITH MEALS PER SLIDING SCALE (FOR DIABETES) 45 mL 1   Insulin  Pen Needle (INSUPEN PEN NEEDLES) 32G X 4 MM MISC Use to inject insulin  4 times daily. 250 each 1   isosorbide  mononitrate (IMDUR ) 60 MG 24 hr tablet Take 1 tablet (60 mg total) by mouth daily. 30 tablet 2   levothyroxine  (SYNTHROID ) 100 MCG tablet TAKE 1 TABLET BY MOUTH IN THE MORNING ON  AN  EMPTY  STOMACH  WITH  WATER   ONLY.  NO  FOOD  OR  OTHER  MEDICATIONS  FOR  30  MINUTES 90 tablet 2   metoprolol  succinate (TOPROL -XL) 50 MG 24 hr tablet Take 1 tablet (50 mg total) by mouth daily. Take with or immediately following a meal. 30 tablet 3   mirtazapine  (REMERON ) 15 MG tablet Take 1 tablet (15 mg total) by mouth at bedtime. For sleep (Patient taking differently: Take 15 mg by mouth at bedtime as needed (sleep).) 90 tablet 2   nicotine  polacrilex (NICOTINE  MINI) 2 MG lozenge Take 1 lozenge (2 mg total) by mouth every 2 (two) hours as needed for smoking  cessation. (Patient not taking: Reported on  07/27/2024) 72 lozenge 3   nitroGLYCERIN  (NITROSTAT ) 0.4 MG SL tablet Place 1 tablet (0.4 mg total) under the tongue every 5 (five) minutes as needed for chest pain. 25 tablet 3   ondansetron  (ZOFRAN ) 4 MG tablet Take 1 tablet (4 mg total) by mouth every 6 (six) hours as needed for nausea. 15 tablet 0   potassium chloride  (KLOR-CON  M) 10 MEQ tablet Take 10 mEq by mouth every Monday, Wednesday, and Friday.     sacubitril -valsartan  (ENTRESTO ) 24-26 MG Take 1 tablet by mouth 2 (two) times daily. 60 tablet 2   sitaGLIPtin  (JANUVIA ) 100 MG tablet Take 1 tablet (100 mg total) by mouth daily. for diabetes. 90 tablet 1   umeclidinium-vilanterol (ANORO ELLIPTA ) 62.5-25 MCG/ACT AEPB Inhale 1 puff into the lungs daily. 30 each 11   No current facility-administered medications for this visit.    Allergies[1]    Social History   Socioeconomic History   Marital status: Married    Spouse name: Laurier   Number of children: Not on file   Years of education: Not on file   Highest education level: Not on file  Occupational History   Not on file  Tobacco Use   Smoking status: Every Day    Current packs/day: 1.00    Average packs/day: 1 pack/day for 45.0 years (45.0 ttl pk-yrs)    Types: Cigarettes   Smokeless tobacco: Never   Tobacco comments:    Has cut back, trying to quit. 1 pack/day per patient.  Vaping Use   Vaping status: Some Days   Substances: Nicotine   Substance and Sexual Activity   Alcohol use: No    Alcohol/week: 0.0 standard drinks of alcohol   Drug use: Not Currently   Sexual activity: Not on file  Other Topics Concern   Not on file  Social History Narrative   Lives at home with her husband in [place].  Previously used marijuana - quit.      Regular exercise: no/ pain from a frozen rotator cuff   Caffeine use: coffee daily and pepsi      Does not have a living will.   Daughters and husband know her wishes- would desire CPR but not prolonged life support if futile   Social  Drivers of Health   Tobacco Use: High Risk (07/25/2024)   Patient History    Smoking Tobacco Use: Every Day    Smokeless Tobacco Use: Never    Passive Exposure: Not on file  Financial Resource Strain: Low Risk (10/22/2023)   Overall Financial Resource Strain (CARDIA)    Difficulty of Paying Living Expenses: Not hard at all  Food Insecurity: No Food Insecurity (07/27/2024)   Epic    Worried About Programme Researcher, Broadcasting/film/video in the Last Year: Never true    Ran Out of Food in the Last Year: Never true  Transportation Needs: No Transportation Needs (07/27/2024)   Epic    Lack of Transportation (Medical): No    Lack of Transportation (Non-Medical): No  Physical Activity: Insufficiently Active (10/22/2023)   Exercise Vital Sign    Days of Exercise per Week: 3 days    Minutes of Exercise per Session: 20 min  Stress: No Stress Concern Present (10/22/2023)   [employer] of Occupational Health - Occupational Stress Questionnaire    Feeling of Stress : Only a little  Social Connections: Moderately Isolated (07/23/2024)   Social Connection and Isolation Panel    Frequency of Communication with Friends  and Family: More than three times a week    Frequency of Social Gatherings with Friends and Family: Once a week    Attends Religious Services: Never    Database Administrator or Organizations: No    Attends Banker Meetings: Never    Marital Status: Married  Catering Manager Violence: Not At Risk (07/27/2024)   Epic    Fear of Current or Ex-Partner: No    Emotionally Abused: No    Physically Abused: No    Sexually Abused: No  Depression (PHQ2-9): Medium Risk (07/27/2024)   Depression (PHQ2-9)    PHQ-2 Score: 10  Alcohol Screen: Low Risk (10/22/2023)   Alcohol Screen    Last Alcohol Screening Score (AUDIT): 0  Housing: Low Risk (07/27/2024)   Epic    Unable to Pay for Housing in the Last Year: No    Number of Times Moved in the Last Year: 0    Homeless in the Last Year: No  Utilities: Not At  Risk (07/27/2024)   Epic    Threatened with loss of utilities: No  Health Literacy: Adequate Health Literacy (10/22/2023)   B1300 Health Literacy    Frequency of need for help with medical instructions: Never      Family History  Problem Relation Age of Onset   Heart attack Mother        First MI @ 33 - Died @ 66   Heart disease Mother    Heart disease Father        Died @ 48   Throat cancer Brother    Liver cancer Brother    Colon cancer Sister    Vitals:   08/01/24 1541  BP: 128/69  Pulse: (!) 50  SpO2: 99%  Weight: 131 lb 3.2 oz (59.5 kg)   Wt Readings from Last 3 Encounters:  08/01/24 131 lb 3.2 oz (59.5 kg)  07/26/24 130 lb 1.1 oz (59 kg)  06/30/24 128 lb (58.1 kg)   Lab Results  Component Value Date   CREATININE 1.53 (H) 07/26/2024   CREATININE 1.68 (H) 07/25/2024   CREATININE 1.57 (H) 07/24/2024     PHYSICAL EXAM: General: Well appearing.  Cor: No JVD. Regular rhythm, bradycardic.  Lungs: clear Abdomen: soft, nontender, nondistended. Extremities: no edema Neuro:. Affect pleasant   ECG: not done   ASSESSMENT & PLAN:  1: HFrEF (previously preserved)- - ischemic etiology with extensive CAD - NYHA Payne III - euvolemic - weighing daily. Reviewed parameters to call about - Echo 06/17: EF 25 to 30% with wall motion abnormality concerning for Takotsubo, G1 DD.   - Echo 03/2016 showed normalization of LV systolic function with EF 60 to 65%, normal wall motion, and G1 DD. - Echo 03/2021: EF of 55%, no RWMA, mild LVH, and G1 DD. - Echo 03/23: LVEF 55 to 60%, wall motion abnormality possible secondary to LBBB, G1 DD, - Echo 09/2022: EF of 55 to 60%, G1 DD, mild MR, Moderate LVH, wall motion abnormality consistent with LBBB. - Echo 05/24/24: EF of 50% with no significant valvular abnormalities. - Echo 07/21/24: EF down to 20 to 25%, prior EF of 55%. - continue farxiga  10mg  daily - continue lasix  40mg  M, W, F / potassium M, W, F - continue toprol  50mg  daily.  HR does not allow for titration - continue entresto  24/26mg  BID - BMET today. Discussed possibly adding MRA with watching renal function closely. If we start MRA, may decrease lasix  / potassium to twice weekly. Will  get lab results back first.  - Reviewed our options if fluid retention starts to worsen such as metolazone, IV lasix  etc - Daughter is going to check with home health agency to see if they would be able to do IV lasix  in the home if it was ordered - If abdominal distention worsens, could change diuretic to torsemide - proBNP 07/21/24 was 8644.0  2: CAD- - last saw [facility] cardiology 12/25. Returns later this week.  - extensive CAD and not candidate for CABG due to co-morbidities - LHC 12/2015 showed patent stents. - R/ LHC 05/16/24. Showed significant diffuse three-vessel coronary artery disease and normal LV systolic function. Normal right and left-sided filling pressures, normal pulmonary pressure and normal cardiac output. Had significant right radial artery spasm  - R/ LHC 07/25/24: no ischemic reason for new diagnosis of HFrEF. Did had patent prior stents and subtotally occluded vessels with left-to-right collaterals which were unchanged. - HS-trop 07/23/24 was 2880 - continue plavix  75mg  daily - continue isosorbide  60mg  daily  3: HTN- - BP 128/69 - BMET 07/26/24 reviewed: K+ 4.0, creatinine 1.53, GFR 35 - BMET today  4: T2DM (managed by PCP)- - A1c 06/24/24 was 8% - continue farxiga , humalog, januvia   5: HLD- - LDL 03/24/24 was 44 - lipo (a) 07/01/24 was 214.2 - continue atorvastatin  80mg  daily - continue repatha  every 2 weeks - continue zetia  10mg  daily  6: Fatigue/ snoring- - sleep study done 2014 - she is unsure if she could tolerate CPAP but is willing to do [place] sleep study at home to see if she has OSA.    Return in 3-4 weeks, sooner if needed.   I spent 65 minutes reviewing records, interviewing/ examing patient and managing plan/ orders.   Sarah DELENA Class,  FNP 08/01/2024     [1] No Known Allergies  "

## 2024-08-01 NOTE — Telephone Encounter (Signed)
 Called and spoke with the pt. Pt states that she is feeling better than when she was in the hospital but she did have an overnight weight gain of 2lbs. Offered to move up her appt date to this week. Pt to call her daughter and call us  back with availability.

## 2024-08-02 ENCOUNTER — Ambulatory Visit: Payer: Self-pay | Admitting: Family

## 2024-08-03 ENCOUNTER — Telehealth: Payer: Self-pay

## 2024-08-03 NOTE — Patient Instructions (Signed)
 Visit Information  Thank you for taking time to visit with me today. Please don't hesitate to contact me if I can be of assistance to you before our next scheduled telephone appointment.  Our next appointment is by telephone on 08/10/24 at 10 am  Following is a copy of your care plan:   Goals Addressed             This Visit's Progress    [facility] Transitions of Care (TOC) Care Plan       Problems:  Recent Hospitalization for treatment of Acute respiratory failure with hypoxia Knowledge Deficit Related to Acute respiratory failure with hypoxia  Goal:  Over the next 30 days, the patient will not experience hospital readmission  Interventions:  Transitions of Care: Doctor Visits  - discussed the importance of doctor visits Reviewed upcoming provider visits Attempted to review medications with patient. Patient request medications be reviewed with daughter.  Attempted to reach daughter however unsuccessful . Assessed breathing status Reviewed and assessed for signs of HF symptoms. Assessed for weight Assessed blood sugar readings SDOH assessed.  Confirmed home health services active.  Discussed recent BMP.  Assessed for ongoing diarrhea symptoms.   Patient Self Care Activities:  Attend all scheduled provider appointments Call pharmacy for medication refills 3-7 days in advance of running out of medications Call provider office for new concerns or questions  Notify RN Care Manager of TOC call rescheduling needs Participate in Transition of Care Program/Attend TOC scheduled calls Take medications as prescribed   Monitor weight and document to take to DR Monitor skin for breakdown with diarrhea Advised to monitor for HF symptoms Advised to call EMS for increase respiratory symptoms.   Plan:  Telephone follow up appointment with care management team member scheduled for: Jalynn Waddell 08/10/2024 10 am        Patient verbalizes understanding of instructions and care plan  provided today and agrees to view in MyChart. Active MyChart status and patient understanding of how to access instructions and care plan via MyChart confirmed with patient.     The patient has been provided with contact information for the care management team and has been advised to call with any health related questions or concerns.   Please call the care guide team at [phone] if you need to cancel or reschedule your appointment.   Please call the Suicide and Crisis Lifeline: 988 call the [place]  National Suicide Prevention Lifeline: 786-750-2457 or TTY: 715-203-4207 TTY 340-207-7850) to talk to a trained counselor call 1-800-273-TALK (toll free, 24 hour hotline) if you are experiencing a Mental Health or Behavioral Health Crisis or need someone to talk to.  Arvin Seip RN, BSN, CCM [employer], Population Health Case Manager Phone: [phone]

## 2024-08-03 NOTE — Transitions of Care (Post Inpatient/ED Visit) (Signed)
 " Transition of Care week 2  Visit Note  08/03/2024  Name: Sarah Payne MRN: [id number]          DOB: Aug 01, 1945  Situation: Patient enrolled in [facility] 30-day program. Visit completed with patient by telephone.   Background:   Initial Transition Care Management Follow-up Telephone Call Discharge Date and Diagnosis: 07/26/24, Acute respiratory failure with hypoxia   Past Medical History:  Diagnosis Date   Acute pain of left shoulder 01/17/2019   Aortic atherosclerosis    CHF (congestive heart failure) (HCC) 09/16/2013   a.) TTE 09/16/2013: EF 55-60%; mod inferior HK, triv TR; G1DD. b.) TTE 01/02/2016: EF 25-30%; mid-apicalateroseptal, lat, inf, inferoseptal, apical akinesis; triv TR; G1DD. c. TTE 04/16/2016: EF 65%; G1DD. d.) TTE 05/02/2020: EF 50-55%; G2DD. e.) TTE 04/11/2021: EF 55%; G1DD.   Chronic low back pain with bilateral sciatica    Chronic painful diabetic neuropathy (HCC)    Chronic radicular lumbar pain    CKD (chronic kidney disease), stage III (HCC)    Clostridioides difficile infection 2015   COPD (chronic obstructive pulmonary disease) (HCC)    Coronary artery disease    a.) PCI 02/06/2011: 100% mLCx - DES x 3. b.) PCI 09/17/2011: 95% dRCA - DES x 1. c.) PCI 10/02/2011: 75% pLCX - DES x 1. d.) PCI 08/19/2012: DES x 2 p-mRCA.  e.) LHC 01/01/16: Takotsubo event 01/01/2016 with patent stents   Depression    Diverticulosis    Failed back syndrome    Frequent PVCs    a. Noted in hospital 12/2015.   GERD (gastroesophageal reflux disease)    History of bilateral cataract extraction 01/2015   History of heart artery stent    a.) TOTAL of 7 stents --> 02/06/2011 - overlapping 2.5 x 12 mm Xience V, 2.5 x 8 mm Xience V, 2.25 x 8 mm Xience Nano to mLCx; 09/17/2011 - 2.5 x 23 Xience V dRCA; 10/02/2011 - 2.5 x 15 mm Xience V pLCx; 08/09/2012 - overlapping 3.0 x 38mm mRCA and 3.5 x 23mm pRCA   Hyperlipidemia    Hypertension    Hypothyroidism    Impingement syndrome of left shoulder     LBBB (left bundle branch block)    Long term current use of clopidogrel     Marijuana abuse    Myocardial infarction [facility])    a.) multiple MIs;  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 12/2015 - nephew committed suicide 1 week prior, sister died the morning of presentation - initially called a STEMI; cath with patent stents. LVEF 25-30%.   Tendonitis of left rotator cuff    Tobacco abuse    Vaginal burning 03/22/2019   Vascular dementia     Assessment: Patient Reported Symptoms: Cognitive Cognitive Status: No symptoms reported, Alert and oriented to person, place, and time, Insightful and able to interpret abstract concepts, Normal speech and language skills      Neurological Neurological Review of Symptoms: No symptoms reported  HEENT HEENT Symptoms Reported: No symptoms reported      Cardiovascular Cardiovascular Symptoms Reported: No symptoms reported Does patient have uncontrolled Hypertension?: No Cardiovascular Management Strategies: Routine screening, Medication therapy, Adequate rest Cardiovascular Comment: Per chart review patient had follow up visit at heart failure clinic on 08/01/24. next cardiology visit scheduled for 08/04/24.  patient denies symptoms.  Respiratory Respiratory Symptoms Reported: No symptoms reported Additional Respiratory Details: Patient states having SOB with exertion however feels this is at her baseline. Respiratory Management Strategies: Routine screening, Medication therapy, Adequate rest  Endocrine Endocrine Symptoms  Reported: No symptoms reported Is patient diabetic?: Yes Is patient checking blood sugars at home?: Yes List most recent blood sugar readings, include date and time of day: patient reports blood sugar ranging from 140-160's fasting. She states she is not able to remember her fasting blood sugar for today.    Gastrointestinal Gastrointestinal Symptoms Reported: No symptoms reported Additional Gastrointestinal Details: patient reports diarrhea has resolved. Gastrointestinal Management Strategies: Incontinence garment/pad, Diet modification, Medication therapy    Genitourinary Genitourinary Symptoms Reported: No symptoms reported    Integumentary Integumentary Symptoms Reported: No symptoms reported    Musculoskeletal Musculoskelatal Symptoms Reviewed: No symptoms reported        Psychosocial Psychosocial Symptoms Reported: No symptoms reported         There were no vitals filed for this visit. Pain Scale: 0-10 Pain Score: 0-No pain  Medications Reviewed Today   Medications were not reviewed in this encounter   Unable to review medication with patient. Patient request medication be reviewed with her daughter. Andrea Daring.  Attempted to contact Melanie. Unable to reach and HIPAA compliant message left with return call request.    Goals Addressed             This Visit's Progress    VBCI Transitions of Care (TOC) Care Plan       Problems:  Recent Hospitalization for treatment of Acute respiratory failure with hypoxia Knowledge Deficit Related to Acute respiratory failure with hypoxia  Goal:  Over the next 30 days, the patient will not experience hospital readmission  Interventions:  Transitions of Care: Doctor Visits  - discussed the importance of doctor visits Reviewed upcoming provider visits Attempted to review medications with patient. Patient request medications be reviewed with daughter.  Attempted to reach daughter however unsuccessful . Assessed breathing  status Reviewed and assessed for signs of HF symptoms. Assessed for weight Assessed blood sugar readings SDOH assessed.  Confirmed home health services active.  Discussed recent BMP.  Assessed for ongoing diarrhea symptoms.   Patient Self Care Activities:  Attend all scheduled provider appointments Call pharmacy for medication refills 3-7 days in advance of running out of medications Call provider office for new concerns or questions  Notify RN Care Manager of TOC call rescheduling needs Participate in Transition of Care Program/Attend TOC scheduled calls Take medications as prescribed   Monitor weight and document to take to DR Monitor skin for breakdown with diarrhea Advised to monitor for HF symptoms Advised to call EMS for increase respiratory symptoms.   Plan:  Telephone follow up appointment with care management team member scheduled for: Justene Jensen 08/10/2024 10 am         Recommendation:   Continue Current Plan of Care  Follow Up Plan:   Telephone follow-up in 1 week  Arvin Seip RN, BSN, CCM [place]  [employer], Population Health Case Manager Phone: [phone]     "

## 2024-08-04 ENCOUNTER — Ambulatory Visit: Attending: Cardiovascular Disease | Admitting: Cardiovascular Disease

## 2024-08-04 ENCOUNTER — Encounter: Payer: Self-pay | Admitting: Cardiovascular Disease

## 2024-08-04 VITALS — BP 118/64 | HR 53 | Ht 66.0 in | Wt 132.1 lb

## 2024-08-04 DIAGNOSIS — Z720 Tobacco use: Secondary | ICD-10-CM | POA: Diagnosis not present

## 2024-08-04 DIAGNOSIS — I10 Essential (primary) hypertension: Secondary | ICD-10-CM | POA: Diagnosis not present

## 2024-08-04 DIAGNOSIS — E785 Hyperlipidemia, unspecified: Secondary | ICD-10-CM | POA: Diagnosis not present

## 2024-08-04 DIAGNOSIS — I739 Peripheral vascular disease, unspecified: Secondary | ICD-10-CM | POA: Diagnosis not present

## 2024-08-04 DIAGNOSIS — I5022 Chronic systolic (congestive) heart failure: Secondary | ICD-10-CM | POA: Diagnosis not present

## 2024-08-04 DIAGNOSIS — I2510 Atherosclerotic heart disease of native coronary artery without angina pectoris: Secondary | ICD-10-CM | POA: Diagnosis not present

## 2024-08-04 MED ORDER — CARVEDILOL 6.25 MG PO TABS: 6.2500 mg | ORAL_TABLET | Freq: Two times a day (BID) | ORAL | 3 refills | Status: AC

## 2024-08-04 NOTE — Progress Notes (Signed)
 "    Cardiology Office Note   Date:  08/04/2024   ID:  Sarah Payne, DOB 11/25/1945, MRN [id number]  PCP:  Sarah Comer MARLA, NP  Cardiologist:  Dr. Darron  Chief Complaint  Patient presents with   Follow-up    Post Cath pt would like to discuss carvedilol  pt no longer taking would like to know if she should be taking medication. Meds reviewed verbally with pt.      History of Present Illness: Sarah Payne is a 79 y.o. female who is here today for follow-up visit regarding coronary artery disease and peripheral arterial disease.   She has known history of coronary artery disease status post multiple PCIs, chronic systolic heart failure, tobacco use, poorly controlled diabetes, intermittent left bundle branch block, essential hypertension and hyperlipidemia.    She was seen by me in 2022 for severe left calf claudication with some rest pain at night.   Noninvasive vascular studies showed an ABI of 1.08 on the right and 0.63 on the left with absent toe pressure. Duplex showed no significant aortoiliac disease. There was evidence of subtotal occlusion in the distal left SFA. Angiography in February 2022 showed no significant aortoiliac disease.  On the left side, there was severe calcified disease in the distal SFA with two-vessel runoff below the knee with occluded anterior tibial artery.  Posterior tibial artery had moderate diffuse disease in the mid to distal segment.  I performed successful orbital atherectomy and drug-coated balloon angioplasty to the left SFA.  Postprocedure ABI improved to normal with patent left SFA. Most recent lower extremity Doppler studies in March 2024 showed normal ABI with normal velocities in the left lower extremity.  She was seen in October for chest pain with EKG changes.  Cardiac catheterization was done by me in October.  It showed significant diffuse three-vessel coronary artery disease and normal LV systolic function.  Right heart catheterization showed  normal right and left-sided filling pressures, normal pulmonary pressure and normal cardiac output.  She had significant right radial artery spasm which made the procedure very difficult.  The patient was referred to cardiothoracic surgery for evaluation of CABG but she was felt to be not a good candidate considering her comorbidities.  She underwent an echocardiogram there which showed an EF of 50% with no significant valvular abnormalities.  Carotid Doppler showed mild nonobstructive disease bilaterally.  I proceeded with PCI on December 2 and performed OCT guided intravascular lithotripsy and drug-eluting stent placement to the proximal LAD as well as successful PCI and 2 overlapped drug-eluting stents to the right AV groove and distal right coronary artery.  She presented back in early January with heart failure with significant volume overload.  Echocardiogram showed a drop in her EF to 25 to 30% with mild to moderate mitral regurgitation. I proceeded with cardiac catheterization which showed patent stents with no new obstructive disease.  Right heart catheterization showed euvolemia after diuresis. Amlodipine  was switched to Entresto .  She does have underlying left bundle branch block and cardiomyopathy might be related to left ventricular dyssynchrony.  She is feeling significantly better with no chest pain or shortness of breath.  She was seen by the heart failure clinic.  Labs were done and showed worsening renal function with a creatinine of 2.  Thus, furosemide  was switched to 40 mg 3 times weekly.  Past Medical History:  Diagnosis Date   Acute pain of left shoulder 01/17/2019   Aortic atherosclerosis    CHF (congestive  heart failure) (HCC) 09/16/2013   a.) TTE 09/16/2013: EF 55-60%; mod inferior HK, triv TR; G1DD. b.) TTE 01/02/2016: EF 25-30%; mid-apicalateroseptal, lat, inf, inferoseptal, apical akinesis; triv TR; G1DD. c. TTE 04/16/2016: EF 65%; G1DD. d.) TTE 05/02/2020: EF 50-55%;  G2DD. e.) TTE 04/11/2021: EF 55%; G1DD.   Chronic low back pain with bilateral sciatica    Chronic painful diabetic neuropathy (HCC)    Chronic radicular lumbar pain    CKD (chronic kidney disease), stage III (HCC)    Clostridioides difficile infection 2015   COPD (chronic obstructive pulmonary disease) (HCC)    Coronary artery disease    a.) PCI 02/06/2011: 100% mLCx - DES x 3. b.) PCI 09/17/2011: 95% dRCA - DES x 1. c.) PCI 10/02/2011: 75% pLCX - DES x 1. d.) PCI 08/19/2012: DES x 2 p-mRCA.  e.) LHC 01/01/16: Takotsubo event 01/01/2016 with patent stents   Depression    Diverticulosis    Failed back syndrome    Frequent PVCs    a. Noted in hospital 12/2015.   GERD (gastroesophageal reflux disease)    History of bilateral cataract extraction 01/2015   History of heart artery stent    a.) TOTAL of 7 stents --> 02/06/2011 - overlapping 2.5 x 12 mm Xience V, 2.5 x 8 mm Xience V, 2.25 x 8 mm Xience Nano to mLCx; 09/17/2011 - 2.5 x 23 Xience V dRCA; 10/02/2011 - 2.5 x 15 mm Xience V pLCx; 08/09/2012 - overlapping 3.0 x 38mm mRCA and 3.5 x 23mm pRCA   Hyperlipidemia    Hypertension    Hypothyroidism    Impingement syndrome of left shoulder    LBBB (left bundle branch block)    Long term current use of clopidogrel     Marijuana abuse    Myocardial infarction [facility])    a.) multiple MIs;  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 12/2015 - nephew committed suicide 1 week prior, sister died the morning of presentation - initially called a STEMI; cath with patent stents. LVEF  25-30%.   Tendonitis of left rotator cuff    Tobacco abuse    Vaginal burning 03/22/2019   Vascular dementia     Past Surgical History:  Procedure Laterality Date   ABDOMINAL AORTOGRAM W/LOWER EXTREMITY N/A 09/12/2020   Procedure: ABDOMINAL AORTOGRAM W/LOWER EXTREMITY;  Surgeon: Darron Deatrice LABOR, MD;  [facility] INVAS[facility] CV LAB;  Service: Cardiovascular;  Laterality: N/A;   ABDOMINAL HYSTERECTOMY     APPENDECTOMY     BACK SURGERY     CARDIAC CATHETERIZATION N/A 01/01/2016   Procedure: Left Heart Cath and Coronary Angiography;  Surgeon: Candyce GORMAN Reek, MD;  Location: [facility] INVASIVE CV LAB;  Service: Cardiovascular;  Laterality: N/A;   CATARACT EXTRACTION W/PHACO Left 01/30/2015   Procedure: CATARACT EXTRACTION PHACO AND INTRAOCULAR LENS PLACEMENT (IOC);  Surgeon: Elsie Carmine, MD;  Location: [facility] ORS;  Service: Ophthalmology;  Laterality: Left;  US  00:47    CATARACT EXTRACTION W/PHACO Right 02/13/2015  Procedure: CATARACT EXTRACTION PHACO AND INTRAOCULAR LENS PLACEMENT (IOC);  Surgeon: Elsie Carmine, MD;  Location: [facility] ORS;  Service: Ophthalmology;  Laterality: Right;  cassette lot # [id number] H US   00:29.9 AP  20.7 CDE  6.20   COLONOSCOPY N/A 11/02/2014   Procedure: COLONOSCOPY;  Surgeon: Lamar JONETTA Aho, MD;  Location: [facility] ENDOSCOPY;  Service: Endoscopy;  Laterality: N/A;   CORONARY ANGIOPLASTY WITH STENT PLACEMENT Left 02/06/2011   Procedure: CORONARY ANGIOPLASTY WITH STENT PLACEMENT; Location: [facility]; Surgeon: Margie Lovelace, MD   CORONARY ANGIOPLASTY WITH STENT PLACEMENT Left 09/17/2011   Procedure: CORONARY ANGIOPLASTY WITH STENT PLACEMENT; Location: [facility]; Surgeon: Margie Lovelace, MD   CORONARY ANGIOPLASTY WITH STENT PLACEMENT Left 10/02/2011   Procedure: CORONARY ANGIOPLASTY WITH STENT PLACEMENT; Location: [facility]; Surgeon: Margie Lovelace, MD   CORONARY ANGIOPLASTY WITH STENT PLACEMENT Left 08/19/2012   Procedure: CORONARY ANGIOPLASTY WITH STENT PLACEMENT; Location: [facility];  Surgeon: Deatrice Cage, MD   CORONARY IMAGING/OCT N/A 06/30/2024   Procedure: CORONARY IMAGING/OCT;  Surgeon: Cage Deatrice LABOR, MD;  Location: [facility] INVASIVE CV LAB;  Service: Cardiovascular;  Laterality: N/A;   CORONARY LITHOTRIPSY N/A 06/30/2024   Procedure: CORONARY LITHOTRIPSY;  Surgeon: Cage Deatrice LABOR, MD;  Location: [facility] INVASIVE CV LAB;  Service: Cardiovascular;  Laterality: N/A;   CORONARY STENT INTERVENTION N/A 06/30/2024   Procedure: CORONARY STENT INTERVENTION;  Surgeon: Cage Deatrice LABOR, MD;  Location: [facility]IVE CV LAB;  Service: Cardiovascular;  Laterality: N/A;   ESOPHAGOGASTRODUODENOSCOPY (EGD) WITH PROPOFOL  N/A 09/21/2018   Procedure: ESOPHAGOGASTRODUODENOSCOPY (EGD) WITH PROPOFOL ;  Surgeon: Therisa Bi, MD;  Location: [facility] ENDOSCOPY;  Service: Gastroenterology;  Laterality: N/A;   ESOPHAGOGASTRODUODENOSCOPY (EGD) WITH PROPOFOL  N/A 10/06/2022   Procedure: ESOPHAGOGASTRODUODENOSCOPY (EGD) WITH PROPOFOL ;  Surgeon: Therisa Bi, MD;  Location: [facility] ENDOSCOPY;  Service: Gastroenterology;  Laterality: N/A;   EYE SURGERY     LEFT HEART CATH AND CORONARY ANGIOGRAPHY Left 08/02/2014   Procedure: LEFT HEART CATH AND CORONARY ANGIOGRAPHY; Location: [facility]; Surgeon: Alm Clay, MD   PERIPHERAL VASCULAR ATHERECTOMY Left 09/12/2020   Procedure: PERIPHERAL VASCULAR ATHERECTOMY;  Surgeon: Cage Deatrice LABOR, MD;  Location: [facility] INVASIVE CV LAB;  Service: Cardiovascular;  Laterality: Left;   PERIPHERAL VASCULAR BALLOON ANGIOPLASTY  09/12/2020   Procedure: PERIPHERAL VASCULAR BALLOON ANGIOPLASTY;  Surgeon: Cage Deatrice LABOR, MD;  [facility]: [facility];  Service: Cardiovascular;;   REVERSE SHOULDER ARTHROPLASTY Left 10/01/2021   Procedure: REVERSE SHOULDER ARTHROPLASTY WITH BICEPS TENODESIS.;  Surgeon: Edie Norleen PARAS, MD;  Location: [facility] ORS;  Service: Orthopedics;  Laterality: Left;   RIGHT AND LEFT HEART CATH N/A 07/25/2024   Procedure: RIGHT AND LEFT HEART CATH;  Surgeon: Cage Deatrice LABOR, MD;  Location: [facility] INVASIVE CV LAB;  Service: Cardiovascular;  Laterality: N/A;   RIGHT/LEFT HEART CATH AND CORONARY ANGIOGRAPHY Bilateral 05/16/2024   Procedure: RIGHT/LEFT HEART CATH AND CORONARY ANGIOGRAPHY;  Surgeon: Cage Deatrice LABOR, MD;  Location: [facility] INVASIVE CV LAB;  Service: Cardiovascular;  Laterality: Bilateral;   SHOULDER SURGERY Left 2017   THORACIC LAMINECTOMY FOR SPINAL CORD STIMULATOR N/A 10/27/2023   Procedure: THORACIC LAMINECTOMY FOR SPINAL CORD STIMULATOR PLACEMENT WITH INTERNAL PULSE GENERATOR IMPLANT;  Surgeon: Claudene Penne ORN, MD;  Location: [facility] ORS;  Service: Neurosurgery;  Laterality: N/A;  THORACIC LAMINECTOMY FOR SPINAL CORD STIMULATOR PLACEMENT WITH INTERNAL PULSE GENERATOR IMPLANT     Current Outpatient Medications  Medication Sig Dispense Refill   acetaminophen  (TYLENOL ) 650 MG CR tablet Take 1,300 mg by mouth every 8 (eight) hours as needed for pain.  albuterol  (VENTOLIN  HFA) 108 (90 Base) MCG/ACT inhaler Inhale 1-2 puffs into the lungs every 6 (six) hours as needed for wheezing or shortness of breath.     aspirin  EC 81 MG tablet Take 1 tablet (81 mg total) by mouth daily. Swallow whole.     atorvastatin  (LIPITOR ) 80 MG tablet Take 1 tablet (80 mg total) by mouth daily. for cholesterol. 90 tablet 2   clobetasol  cream (TEMOVATE ) 0.05 % Apply 1 Application topically 2 (two) times daily as needed. For vaginal itching. 30 g 0   clopidogrel  (PLAVIX ) 75 MG tablet Take 1 tablet (75 mg total) by mouth daily. 90 tablet 3   Continuous Glucose Receiver (FREESTYLE LIBRE 2 READER) DEVI Use with sensor to check blood sugars 6 times daily. 1 each 0   Continuous Glucose Sensor (FREESTYLE LIBRE 2 SENSOR) MISC USE TO CHECK BLOOD SUGAR CHANGE EVERY 14 DAYS 6 each 3   dapagliflozin  propanediol (FARXIGA ) 10 MG TABS tablet Take 1 tablet (10 mg total) by mouth daily. 30 tablet 2   Evolocumab  (REPATHA  SURECLICK) 140 MG/ML SOAJ Inject 140 mg into the skin every 14  (fourteen) days. 2 mL 2   ezetimibe  (ZETIA ) 10 MG tablet Take 1 tablet (10 mg total) by mouth daily. For cholesterol. 90 tablet 2   furosemide  (LASIX ) 40 MG tablet Take 1 tablet (40 mg total) by mouth every Monday, Wednesday, and Friday. 30 tablet 2   gabapentin  (NEURONTIN ) 300 MG capsule Take 1 capsule (300 mg total) by mouth daily. May take an additional dose throughout the day if needed for pain (Patient taking differently: Take 300 mg by mouth daily as needed (Pain).) 180 capsule 2   Glucagon  (GVOKE HYPOPEN  2-PACK) 1 MG/0.2ML SOAJ Inject 1 mg (1 pen) as needed for severe low blood sugar (sustained glucose less than 55 despite oral glucose treatments). May repeat in 15 minutes as needed. 0.4 mL 1   insulin  aspart (NOVOLOG  FLEXPEN) 100 UNIT/ML FlexPen INJECT 5 UNITS SUBCUTANEOUSLY THREE TIMES DAILY WITH MEALS PER SLIDING SCALE (FOR DIABETES) 45 mL 1   Insulin  Pen Needle (INSUPEN PEN NEEDLES) 32G X 4 MM MISC Use to inject insulin  4 times daily. 250 each 1   isosorbide  mononitrate (IMDUR ) 60 MG 24 hr tablet Take 1 tablet (60 mg total) by mouth daily. 30 tablet 2   levothyroxine  (SYNTHROID ) 100 MCG tablet TAKE 1 TABLET BY MOUTH IN THE MORNING ON  AN  EMPTY  STOMACH  WITH  WATER   ONLY.  NO  FOOD  OR  OTHER  MEDICATIONS  FOR  30  MINUTES 90 tablet 2   metoprolol  succinate (TOPROL -XL) 50 MG 24 hr tablet Take 1 tablet (50 mg total) by mouth daily. Take with or immediately following a meal. 30 tablet 3   mirtazapine  (REMERON ) 15 MG tablet Take 1 tablet (15 mg total) by mouth at bedtime. For sleep (Patient taking differently: Take 15 mg by mouth at bedtime as needed (sleep).) 90 tablet 2   nitroGLYCERIN  (NITROSTAT ) 0.4 MG SL tablet Place 1 tablet (0.4 mg total) under the tongue every 5 (five) minutes as needed for chest pain. 25 tablet 3   ondansetron  (ZOFRAN ) 4 MG tablet Take 1 tablet (4 mg total) by mouth every 6 (six) hours as needed for nausea. 15 tablet 0   potassium chloride  (KLOR-CON  M) 10 MEQ tablet  Take 10 mEq by mouth every Monday, Wednesday, and Friday.     sacubitril -valsartan  (ENTRESTO ) 24-26 MG Take 1 tablet by mouth 2 (two) times daily.  60 tablet 2   sitaGLIPtin  (JANUVIA ) 100 MG tablet Take 1 tablet (100 mg total) by mouth daily. for diabetes. 90 tablet 1   umeclidinium-vilanterol (ANORO ELLIPTA ) 62.5-25 MCG/ACT AEPB Inhale 1 puff into the lungs daily. 30 each 11   No current facility-administered medications for this visit.    Allergies:   Patient has no known allergies.    Social History:  The patient  reports that she has quit smoking. Her smoking use included cigarettes. She has a 45 pack-year smoking history. She has never used smokeless tobacco. She reports that she does not currently use drugs. She reports that she does not drink alcohol.   Family History:  The patient's family history includes Colon cancer in her sister; Heart attack in her mother; Heart disease in her father and mother; Liver cancer in her brother; Throat cancer in her brother.    ROS:  Please see the history of present illness.   Otherwise, review of systems are positive for none.   All other systems are reviewed and negative.    PHYSICAL EXAM: VS:  BP 118/64 (BP Location: Left Arm, Patient Position: Sitting, Cuff Size: Normal)   Pulse (!) 53   Ht 5' 6 (1.676 m)   Wt 132 lb 2 oz (59.9 kg)   SpO2 99%   BMI 21.33 kg/m  , BMI Body mass index is 21.33 kg/m. GEN: Well nourished, well developed, in no acute distress  HEENT: normal  Neck: no JVD, carotid bruits, or masses Cardiac: RRR; no murmurs, rubs, or gallops,no edema  Respiratory:  clear to auscultation bilaterally, normal work of breathing GI: soft, nontender, nondistended, + BS MS: no deformity or atrophy  Skin: warm and dry, no rash Neuro:  Strength and sensation are intact Psych: euthymic mood, full affect Vascular: Femoral pulses normal bilaterally.     EKG:  EKG is ordered today. Sinus bradycardia Left bundle branch block      Recent Labs: 03/24/2024: TSH 1.21 04/26/2024: B Natriuretic Peptide 262.5 07/21/2024: Pro Brain Natriuretic Peptide 8,644.0 07/22/2024: ALT 19; Magnesium  2.0 07/25/2024: Hemoglobin 10.5; Platelets 287 08/01/2024: BUN 25; Creatinine, Ser 2.07; Potassium 4.6; Sodium 139    Lipid Panel    Component Value Date/Time   CHOL 102 03/24/2024 0919   CHOL 221 (H) 09/11/2023 1027   CHOL 292 (H) 08/02/2014 0408   TRIG 75.0 03/24/2024 0919   TRIG 388 (H) 08/02/2014 0408   HDL 43.80 03/24/2024 0919   HDL 67 09/11/2023 1027   HDL 34 (L) 08/02/2014 0408   CHOLHDL 2 03/24/2024 0919   VLDL 15.0 03/24/2024 0919   VLDL 78 (H) 08/02/2014 0408   LDLCALC 44 03/24/2024 0919   LDLCALC 134 (H) 09/11/2023 1027   LDLCALC 180 (H) 08/02/2014 0408   LDLDIRECT 52 08/28/2022 1509      Wt Readings from Last 3 Encounters:  08/04/24 132 lb 2 oz (59.9 kg)  08/01/24 131 lb 3.2 oz (59.5 kg)  07/26/24 130 lb 1.1 oz (59 kg)            No data to display            ASSESSMENT AND PLAN:  1.  Coronary artery disease involving native coronary arteries without angina: Recent cardiac catheterization showed patent LAD and RCA stent with no new obstructive disease.  Continue long-term dual antiplatelet therapy as tolerated.  2.  Chronic systolic heart failure: Recent drop of ejection fraction to 25 to 30% could be due to stress-induced cardiomyopathy or left ventricular  dyssynchrony related to left bundle branch block. She appears to be euvolemic now on furosemide  was decreased to 40 mg 3 times weekly due to worsening renal function.  Continue Entresto  and Farxiga . Given underlying bradycardia, I elected to switch her from Toprol  to carvedilol  6.25 mg twice daily.  This should have better metabolic profile anyway in diabetics.  No plans to add spironolactone  at the present time given recent worsening of renal function. I requested a follow-up echocardiogram to be done in 3 months from now.  If EF is less than 35%, we  will refer her for ICD CRT.  3.  Peripheral arterial disease: Status post orbital atherectomy and drug-coated balloon angioplasty to the left SFA. No recurrent claudication.  Most recent Doppler studies showed normal ABI and velocities.  4. Hyperlipidemia: Continue treatment with rosuvastatin  and Zetia .  Most recent lipid profile showed an LDL of 44.  5. Essential hypertension: Blood pressure is controlled on current medications.  6.  Previous tobacco use: She quit smoking few months ago.    Disposition:   Follow-up in 3 months after echocardiogram.  Signed,  Deatrice Cage, MD  08/04/2024 9:45 AM    [facility] "

## 2024-08-04 NOTE — Patient Instructions (Addendum)
 Medication Instructions:  STOP the Metoprolol  (Toprol )  START Carvedilol  (Coreg ) 6.25 mg twice daily  *If you need a refill on your cardiac medications before your next appointment, please call your pharmacy*  Lab Work: None ordered If you have labs (blood work) drawn today and your tests are completely normal, you will receive your results only by: MyChart Message (if you have MyChart) OR A paper copy in the mail If you have any lab test that is abnormal or we need to change your treatment, we will call you to review the results.  Testing/Procedures: Your physician has requested that you have an echocardiogram in 3 months. Echocardiography is a painless test that uses sound waves to create images of your heart. It provides your doctor with information about the size and shape of your heart and how well your hearts chambers and valves are working.   You may receive an ultrasound enhancing agent through an IV if needed to better visualize your heart during the echo. This procedure takes approximately one hour.  There are no restrictions for this procedure.  This will take place at [address] [facility]) [address], [place] [address]  Please note: We ask at that you not bring children with you during ultrasound (echo/ vascular) testing. Due to room size and safety concerns, children are not allowed in the ultrasound rooms during exams. Our front office staff cannot provide observation of children in our lobby area while testing is being conducted. An adult accompanying a patient to their appointment will only be allowed in the ultrasound room at the discretion of the ultrasound technician under special circumstances. We apologize for any inconvenience.   Follow-Up: At [facility], you and your health needs are our priority.  As part of our continuing mission to provide you with exceptional heart care, our providers are all part of one team.  This team includes your  primary Cardiologist (physician) and Advanced Practice Providers or APPs (Physician Assistants and Nurse Practitioners) who all work together to provide you with the care you need, when you need it.  Your next appointment:   3 month(s) after the echo  Provider:   Dr. Darron   We recommend signing up for the patient portal called MyChart.  Sign up information is provided on this After Visit Summary.  MyChart is used to connect with patients for Virtual Visits (Telemedicine).  Patients are able to view lab/test results, encounter notes, upcoming appointments, etc.  Non-urgent messages can be sent to your provider as well.   To learn more about what you can do with MyChart, go to [email or web address].

## 2024-08-10 ENCOUNTER — Telehealth: Payer: Self-pay

## 2024-08-10 NOTE — Patient Instructions (Signed)
 Visit Information  Thank you for taking time to visit with me today. Please don't hesitate to contact me if I can be of assistance to you before our next scheduled telephone appointment.  Our next appointment is by telephone on 08/17/24 at 1000am  Following is a copy of your care plan:   Goals Addressed             This Visit's Progress    VBCI Transitions of Care (TOC) Care Plan       Problems:  Recent Hospitalization for treatment of Acute respiratory failure with hypoxia Knowledge Deficit Related to Acute respiratory failure with hypoxia  Goal:  Over the next 30 days, the patient will not experience hospital readmission  Interventions:  Transitions of Care: Doctor Visits  - discussed the importance of doctor visits Reviewed upcoming provider visits Reviewed medications  Assessed breathing status Reviewed and assessed for signs of HF symptoms. Assessed for weight Assessed blood sugar readings Confirmed home health services active. Last visit 1/20 Discussed recent BMP.  Assessed for ongoing diarrhea symptoms. No diarrhea  Patient Self Care Activities:  Attend all scheduled provider appointments Call pharmacy for medication refills 3-7 days in advance of running out of medications Call provider office for new concerns or questions  Notify RN Care Manager of TOC call rescheduling needs Participate in Transition of Care Program/Attend TOC scheduled calls Take medications as prescribed   Monitor weight and document to take to DR Monitor skin for breakdown with diarrhea Advised to monitor for HF symptoms Advised to call EMS for increase respiratory symptoms.   Plan:  Telephone follow up appointment with care management team member scheduled for: Davina Green 08/17/2024 10 am        Patient verbalizes understanding of instructions and care plan provided today and agrees to view in MyChart. Active MyChart status and patient understanding of how to access instructions  and care plan via MyChart confirmed with patient.     The patient has been provided with contact information for the care management team and has been advised to call with any health related questions or concerns.   Please call the care guide team at [phone] if you need to cancel or reschedule your appointment.   Please call the Suicide and Crisis Lifeline: 988 if you are experiencing a Mental Health or Behavioral Health Crisis or need someone to talk to.  Carlinda Ohlson J. Smera Guyette RN, MSN [facility], [facility] RN Care Manager Direct Dial: [phone]  Fax: [phone] Website: [email or web address]

## 2024-08-10 NOTE — Transitions of Care (Post Inpatient/ED Visit) (Signed)
 " Transition of Care week 3  Visit Note  08/10/2024  Name: Sarah Payne MRN: [id number]          DOB: 1946-01-24  Situation: Patient enrolled in [facility] 30-day program. Visit completed with patient by telephone.   Background:   Initial Transition Care Management Follow-up Telephone Call Discharge Date and Diagnosis: 07/26/24, Acute respiratory failure with hypoxia   Past Medical History:  Diagnosis Date   Acute pain of left shoulder 01/17/2019   Aortic atherosclerosis    CHF (congestive heart failure) (HCC) 09/16/2013   a.) TTE 09/16/2013: EF 55-60%; mod inferior HK, triv TR; G1DD. b.) TTE 01/02/2016: EF 25-30%; mid-apicalateroseptal, lat, inf, inferoseptal, apical akinesis; triv TR; G1DD. c. TTE 04/16/2016: EF 65%; G1DD. d.) TTE 05/02/2020: EF 50-55%; G2DD. e.) TTE 04/11/2021: EF 55%; G1DD.   Chronic low back pain with bilateral sciatica    Chronic painful diabetic neuropathy (HCC)    Chronic radicular lumbar pain    CKD (chronic kidney disease), stage III (HCC)    Clostridioides difficile infection 2015   COPD (chronic obstructive pulmonary disease) (HCC)    Coronary artery disease    a.) PCI 02/06/2011: 100% mLCx - DES x 3. b.) PCI 09/17/2011: 95% dRCA - DES x 1. c.) PCI 10/02/2011: 75% pLCX - DES x 1. d.) PCI 08/19/2012: DES x 2 p-mRCA.  e.) LHC 01/01/16: Takotsubo event 01/01/2016 with patent stents   Depression    Diverticulosis    Failed back syndrome    Frequent PVCs    a. Noted in hospital 12/2015.   GERD (gastroesophageal reflux disease)    History of bilateral cataract extraction 01/2015   History of heart artery stent    a.) TOTAL of 7 stents --> 02/06/2011 - overlapping 2.5 x 12 mm Xience V, 2.5 x 8 mm Xience V, 2.25 x 8 mm Xience Nano to mLCx; 09/17/2011 - 2.5 x 23 Xience V dRCA; 10/02/2011 - 2.5 x 15 mm Xience V pLCx; 08/09/2012 - overlapping 3.0 x 38mm mRCA and 3.5 x 23mm pRCA   Hyperlipidemia    Hypertension    Hypothyroidism    Impingement syndrome of left shoulder     LBBB (left bundle branch block)    Long term current use of clopidogrel     Marijuana abuse    Myocardial infarction [facility])    a.) multiple MIs;  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 12/2015 - nephew committed suicide 1 week prior, sister died the morning of presentation - initially called a STEMI; cath with patent stents. LVEF 25-30%.   Tendonitis of left rotator cuff    Tobacco abuse    Vaginal burning 03/22/2019   Vascular dementia     Assessment: Patient Reported Symptoms: Cognitive Cognitive Status: Alert and oriented to person, place, and time, Normal speech and language skills      Neurological Neurological Review of Symptoms: No symptoms reported    HEENT HEENT Symptoms Reported: No symptoms reported  Cardiovascular Cardiovascular Symptoms Reported: No symptoms reported Does patient have uncontrolled Hypertension?: No Cardiovascular Management Strategies: Routine screening, Medication therapy, Adequate rest Weight: 130 lb (59 kg) Cardiovascular Comment: Seen by heart MD on 1/15.  No changes  Respiratory Respiratory Symptoms Reported: No symptoms reported Additional Respiratory Details: Patient states she has some shortness of breath at times but reports able to go to the gorcery store this morning and did well. Respiratory Management Strategies: Routine screening, Medication therapy, Adequate rest Respiratory Self-Management Outcome: 4 (good)  Endocrine Endocrine Symptoms Reported: No symptoms reported Is patient diabetic?: Yes Is patient  checking blood sugars at home?: Yes List most recent blood sugar readings, include date and time of day: Patient reports blood sugar average is 150.  Discused diabetes management Endocrine Self-Management Outcome: 4 (good)  Gastrointestinal Gastrointestinal Symptoms Reported: No symptoms reported      Genitourinary Genitourinary Symptoms Reported: No symptoms reported    Integumentary Integumentary Symptoms Reported: No symptoms reported    Musculoskeletal Musculoskelatal Symptoms Reviewed: No symptoms reported        Psychosocial Psychosocial Symptoms Reported: No symptoms reported         Today's Vitals   08/10/24 1120  Weight: 130 lb (59 kg)      Medications Reviewed Today     Reviewed by Areesha Dehaven, RN (Case Manager) on 08/10/24 at 1107  Med List Status: <None>   Medication Order Taking? Sig Documenting Provider Last Dose Status Informant  acetaminophen  (TYLENOL ) 650 MG CR tablet [id number] Yes Take 1,300 mg by mouth every 8 (eight) hours as needed for pain. [provider]  Active Self  albuterol  (VENTOLIN  HFA) 108 (90 Base) MCG/ACT inhaler [id number] Yes Inhale 1-2 puffs into the lungs every 6 (six) hours as needed for wheezing or shortness of breath. [provider]  Active Self  aspirin  EC 81 MG tablet [id number] Yes Take 1 tablet (81 mg total) by mouth daily. Swallow whole. Darron Deatrice LABOR, MD  Active Self  atorvastatin  (LIPITOR ) 80 MG tablet [id number] Yes Take 1 tablet (80 mg total) by mouth daily. for cholesterol. Gretta Comer POUR, NP  Active Self  carvedilol  (COREG ) 6.25 MG tablet [id number]  Take 1 tablet (6.25 mg total) by mouth 2 (two) times daily. Darron Deatrice LABOR, MD  Active   clobetasol  cream (TEMOVATE ) 0.05 % [id number] Yes Apply 1 Application topically 2 (two) times daily as needed. For vaginal itching. Clark, Katherine K, NP  Active Self  clopidogrel  (PLAVIX ) 75 MG tablet [id number] Yes Take 1 tablet (75 mg total) by mouth daily. Darron Deatrice LABOR, MD  Active Self  Continuous Glucose Receiver (FREESTYLE LIBRE 2 READER) [place]VI [id number] Yes Use with sensor to check blood sugars 6 times daily. Gretta Comer POUR, NP  Active Self  Continuous Glucose Sensor (FREESTYLE LIBRE 2 SENSOR) [place] [id number] Yes USE TO CHECK BLOOD SUGAR CHANGE EVERY 14 DAYS Gretta Comer POUR, NP  Active Self  dapagliflozin  propanediol (FARXIGA ) 10 MG TABS tablet [id number] Yes Take 1 tablet (10 mg total) by mouth daily. Caleen Qualia, MD  Active   Evolocumab  (REPATHA  SURECLICK) 140 MG/ML EMMANUEL [id number] Yes Inject 140 mg into the skin every 14 (fourteen) days. Darron Deatrice LABOR, MD  Active   ezetimibe  (ZETIA ) 10 MG tablet [id number] Yes Take 1 tablet (10 mg total) by mouth daily. For cholesterol. Clark, Katherine K, NP  Active Self  furosemide  (LASIX ) 40 MG tablet [id number] Yes Take 1 tablet (40 mg total) by mouth every Monday, Wednesday, and Friday. Amin, Sumayya,  MD  Active   gabapentin  (NEURONTIN ) 300 MG capsule [id number] Yes Take 1 capsule (300 mg total) by mouth daily. May take an additional dose throughout the day if needed for pain  Patient taking differently: Take 300 mg by mouth daily as needed (Pain).   Gretta Comer POUR, NP  Active Self  Glucagon  (GVOKE HYPOPEN  2-PACK) 1 MG/0.2ML EMMANUEL [id number] Yes Inject 1 mg (1 pen) as needed for severe low blood sugar (sustained glucose less than 55 despite oral glucose treatments). May repeat in 15 minutes as needed. Gretta Comer POUR, NP  Active Self  insulin  aspart (NOVOLOG  FLEXPEN) 100 UNIT/ML FlexPen [id number] Yes INJECT 5 UNITS SUBCUTANEOUSLY THREE TIMES DAILY WITH MEALS PER SLIDING SCALE (FOR DIABETES) Gretta Comer POUR, NP  Active Self  Insulin  Pen Needle (INSUPEN PEN NEEDLES) 32G X 4 MM MISC [id number] Yes Use to inject insulin  4 times daily. Clark, Katherine K, NP  Active Self  isosorbide  mononitrate (IMDUR ) 60 MG 24 hr tablet [id number] Yes Take 1 tablet (60 mg total) by mouth daily. Amin, Sumayya, MD  Active    levothyroxine  (SYNTHROID ) 100 MCG tablet [id number] Yes TAKE 1 TABLET BY MOUTH IN THE MORNING ON  AN  EMPTY  STOMACH  WITH  WATER   ONLY.  NO  FOOD  OR  OTHER  MEDICATIONS  FOR  30  MINUTES Clark, Katherine K, NP  Active Self  mirtazapine  (REMERON ) 15 MG tablet [id number] Yes Take 1 tablet (15 mg total) by mouth at bedtime. For sleep  Patient taking differently: Take 15 mg by mouth at bedtime as needed (sleep).   Clark, Katherine K, NP  Active Self  nitroGLYCERIN  (NITROSTAT ) 0.4 MG SL tablet [id number] Yes Place 1 tablet (0.4 mg total) under the tongue every 5 (five) minutes as needed for chest pain. Darron Deatrice LABOR, MD  Active Self  ondansetron  (ZOFRAN ) 4 MG tablet [id number] Yes Take 1 tablet (4 mg total) by mouth every 6 (six) hours as needed for nausea. Clark, Katherine K, NP  Active Self  potassium chloride  (KLOR-CON  M) 10 MEQ tablet [id number] Yes Take 10 mEq by mouth every Monday, Wednesday, and Friday. [provider]  Active Self  sacubitril -valsartan  (ENTRESTO ) 24-26 MG [id number] Yes Take 1 tablet by mouth 2 (two) times daily. Caleen Qualia, MD  Active   sitaGLIPtin  (JANUVIA ) 100 MG tablet [id number] Yes Take 1 tablet (100 mg total) by mouth daily. for diabetes. Clark, Katherine K, NP  Active Self           Med Note CARALYN, LINDSAY R   Tue Oct 06, 2023  8:22 AM)    umeclidinium-vilanterol (ANORO ELLIPTA ) 62.5-25 MCG/ACT AEPB [id number] Yes Inhale 1 puff into the lungs daily. Isadora Hose, MD  Active Self  Med List Note Kerrin Eleanor SAUNDERS, CPhT 05/12/24 1346):              Goals Addressed             This Visit's Progress    VBCI Transitions of Care (TOC) Care Plan       Problems:  Recent Hospitalization for treatment of Acute respiratory failure with hypoxia Knowledge Deficit Related to Acute respiratory failure with hypoxia  Goal:  Over the next 30 days, the patient will not experience hospital readmission  Interventions:  Transitions of Care: Doctor Visits  -  discussed the importance of doctor visits Reviewed upcoming provider visits Reviewed medications  Assessed breathing status Reviewed and assessed for signs of HF symptoms. Assessed for weight Assessed blood  sugar readings Confirmed home health services active. Last visit 1/20 Discussed recent BMP.  Assessed for ongoing diarrhea symptoms. No diarrhea  Patient Self Care Activities:  Attend all scheduled provider appointments Call pharmacy for medication refills 3-7 days in advance of running out of medications Call provider office for new concerns or questions  Notify RN Care Manager of TOC call rescheduling needs Participate in Transition of Care Program/Attend TOC scheduled calls Take medications as prescribed   Monitor weight and document to take to DR Monitor skin for breakdown with diarrhea Advised to monitor for HF symptoms Advised to call EMS for increase respiratory symptoms.   Plan:  Telephone follow up appointment with care management team member scheduled for: Davina Green 08/17/2024 10 am        Recommendation:   Continue Current Plan of Care  Follow Up Plan:   Telephone follow-up in 1 week  Madason Rauls J. Sohum Delillo RN, MSN [facility], [facility] RN Care Manager Direct Dial: [phone]  Fax: [phone] Website: [email or web address]      "

## 2024-08-12 ENCOUNTER — Ambulatory Visit: Admitting: Family

## 2024-08-16 ENCOUNTER — Ambulatory Visit: Admitting: Family

## 2024-08-17 ENCOUNTER — Telehealth: Payer: Self-pay

## 2024-08-17 NOTE — Patient Instructions (Signed)
 Visit Information  Thank you for taking time to visit with me today. Please don't hesitate to contact me if I can be of assistance to you before our next scheduled telephone appointment.  Our next appointment is by telephone on 08/25/24 at 11 am  Following is a copy of your care plan:   Goals Addressed             This Visit's Progress    VBCI Transitions of Care (TOC) Care Plan       Problems:  Recent Hospitalization for treatment of Acute respiratory failure with hypoxia Knowledge Deficit Related to Acute respiratory failure with hypoxia  Diabetes management.   Goal:  Over the next 30 days, the patient will not experience hospital readmission  Interventions:  Transitions of Care: Doctor Visits  - discussed the importance of doctor visits Reviewed upcoming provider visits Medications reviewed and compliance with medications discussed. Assessed breathing status Reviewed and assessed for signs of HF symptoms. Assessed for weight Assessed blood sugar readings Confirmed home health services active Advised to monitor blood pressure when experiencing lightheadedness and record readings.  Advised if having low blood pressures to notify provider Discussed hypoglycemia/ hyperglycemia management. Discussed rule of 15.   Patient Self Care Activities:  Attend all scheduled provider appointments Call pharmacy for medication refills 3-7 days in advance of running out of medications Call provider office for new concerns or questions  Notify RN Care Manager of TOC call rescheduling needs Participate in Transition of Care Program/Attend TOC scheduled calls Take medications as prescribed   Monitor blood pressure and record readings if experiencing lightheadedness.  Notify provider for blood pressures under 100/70 monitor for HF symptoms.  Notify provider for increase in heart failure symptoms.  Seek emergency medical services for severe heart failure symptoms such as shortness of breath and/  or chest pain FOLLOW RULE OF 15 HYPOGLYCEMIC MANAGEMENT   Check blood glucose If it's below 70?mg/dL (or under 44-29?fh/iO depending on guidelines), you should treat.   Consume 15?grams of fast-acting carbohydrates Options include:  3-4 glucose tablets  cup (4?oz) of fruit juice or regular soda 1 tablespoon (about 15?g) of sugar, honey, or corn syrup 1 tube of glucose gel.   Wait 15 minutes, then recheck blood glucose.   If still low (<70?mg/dL): Repeat the process--another 15?g fast carbs and recheck after 15 minutes.   Once blood glucose is normalized (>70?mg/dL): Eat a balanced snack or meal with carbs and protein (e.g., crackers with cheese, yogurt with fruit, sandwich) to stabilize levels.    Plan:  Telephone follow up appointment with care management team member scheduled for: Sarah Payne 08/25/24/2026 11 am        Patient verbalizes understanding of instructions and care plan provided today and agrees to view in MyChart. Active MyChart status and patient understanding of how to access instructions and care plan via MyChart confirmed with patient.     The patient has been provided with contact information for the care management team and has been advised to call with any health related questions or concerns.   Please call the care guide team at [phone] if you need to cancel or reschedule your appointment.   Please call the Suicide and Crisis Lifeline: 988 call the [place]  National Suicide Prevention Lifeline: 3070332649 or TTY: 602-409-1361 TTY 807-396-9073) to talk to a trained counselor call 1-800-273-TALK (toll free, 24 hour hotline) if you are experiencing a Mental Health or Behavioral Health Crisis or need someone to talk to.  Sarah Payne  Sarah Payne CHARITY FUNDRAISER, SCIENTIST, RESEARCH (PHYSICAL SCIENCES), CCM [employer], Population Health Case Manager Phone: [phone]

## 2024-08-17 NOTE — Transitions of Care (Post Inpatient/ED Visit) (Signed)
 " Transition of Care week 4  Visit Note  08/17/2024  Name: Sarah Payne MRN: [id number]          DOB: 15-Sep-1945  Situation: Patient enrolled in [facility] 30-day program. Visit completed with patient by telephone.   Background:   Initial Transition Care Management Follow-up Telephone Call Discharge Date and Diagnosis: 07/26/24, Acute respiratory failure with hypoxia   Past Medical History:  Diagnosis Date   Acute pain of left shoulder 01/17/2019   Aortic atherosclerosis    CHF (congestive heart failure) (HCC) 09/16/2013   a.) TTE 09/16/2013: EF 55-60%; mod inferior HK, triv TR; G1DD. b.) TTE 01/02/2016: EF 25-30%; mid-apicalateroseptal, lat, inf, inferoseptal, apical akinesis; triv TR; G1DD. c. TTE 04/16/2016: EF 65%; G1DD. d.) TTE 05/02/2020: EF 50-55%; G2DD. e.) TTE 04/11/2021: EF 55%; G1DD.   Chronic low back pain with bilateral sciatica    Chronic painful diabetic neuropathy (HCC)    Chronic radicular lumbar pain    CKD (chronic kidney disease), stage III (HCC)    Clostridioides difficile infection 2015   COPD (chronic obstructive pulmonary disease) (HCC)    Coronary artery disease    a.) PCI 02/06/2011: 100% mLCx - DES x 3. b.) PCI 09/17/2011: 95% dRCA - DES x 1. c.) PCI 10/02/2011: 75% pLCX - DES x 1. d.) PCI 08/19/2012: DES x 2 p-mRCA.  e.) LHC 01/01/16: Takotsubo event 01/01/2016 with patent stents   Depression    Diverticulosis    Failed back syndrome    Frequent PVCs    a. Noted in hospital 12/2015.   GERD (gastroesophageal reflux disease)    History of bilateral cataract extraction 01/2015   History of heart artery stent    a.) TOTAL of 7 stents --> 02/06/2011 - overlapping 2.5 x 12 mm Xience V, 2.5 x 8 mm Xience V, 2.25 x 8 mm Xience Nano to mLCx; 09/17/2011 - 2.5 x 23 Xience V dRCA; 10/02/2011 - 2.5 x 15 mm Xience V pLCx; 08/09/2012 - overlapping 3.0 x 38mm mRCA and 3.5 x 23mm pRCA   Hyperlipidemia    Hypertension    Hypothyroidism    Impingement syndrome of left shoulder     LBBB (left bundle branch block)    Long term current use of clopidogrel     Marijuana abuse    Myocardial infarction [facility])    a.) multiple MIs;  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 12/2015 - nephew committed suicide 1 week prior, sister died the morning of presentation - initially called a STEMI; cath with patent stents. LVEF 25-30%.   Tendonitis of left rotator cuff    Tobacco abuse    Vaginal burning 03/22/2019   Vascular dementia     Assessment: Patient Reported Symptoms: Cognitive Cognitive Status: No symptoms reported, Alert and oriented to person, place, and time, Insightful and able to interpret abstract concepts, Normal speech and language skills      Neurological Neurological Review of Symptoms: No symptoms reported  HEENT HEENT Symptoms Reported: No symptoms reported      Cardiovascular Cardiovascular Symptoms Reported: Lightheadness Does patient have uncontrolled Hypertension?:  (patient states she is unsure) Cardiovascular Management Strategies: Routine screening, Medication therapy Cardiovascular Comment: patient reports having lightheadedness when standing for the past week. She denies any additional symptoms. She reports her breathing is fine. patient states she has a blood pressure monitor however has not checked it.  Respiratory Respiratory Symptoms Reported: No symptoms reported    Endocrine Endocrine Symptoms Reported: No symptoms reported Is patient diabetic?: Yes Is patient checking blood sugars at home?: Yes List most recent blood  sugar readings, include date and time of day: Patient reports her fasting blood sugar today is 176. She reports wearing the [facility] for monitoring.  Patient states her 14 day average is 181. She states she may have a blood sugar <70 1-2 times per week and reports her highest blood sugar has been 181.    Gastrointestinal Gastrointestinal Symptoms Reported: No symptoms reported      Genitourinary Genitourinary Symptoms Reported: No symptoms reported    Integumentary Integumentary Symptoms Reported: No symptoms reported    Musculoskeletal Musculoskelatal Symptoms Reviewed: No symptoms reported        Psychosocial Psychosocial Symptoms Reported: No symptoms reported         There were no vitals filed for this visit. Pain Scale: 0-10 Pain Score: 0-No pain  Medications Reviewed Today     Reviewed by Stevan Eberwein E, RN (Registered Nurse) on 08/17/24 at 1038  Med List Status: <None>   Medication Order Taking? Sig Documenting Provider Last Dose Status Informant  acetaminophen  (TYLENOL ) 650 MG CR tablet [id number] Yes Take 1,300 mg by mouth every 8 (eight) hours as needed for pain. [provider]  Active Self  albuterol  (VENTOLIN  HFA) 108 (90 Base) MCG/ACT inhaler [id number] Yes Inhale 1-2 puffs into the lungs every 6 (six) hours as needed for wheezing or shortness of breath. [provider]  Active Self  aspirin  EC 81 MG tablet [id number] Yes Take 1 tablet (81 mg total) by mouth daily. Swallow whole. Darron Deatrice LABOR, MD  Active Self  atorvastatin  (LIPITOR ) 80 MG tablet [id number] Yes Take 1 tablet (80 mg total) by mouth daily. for cholesterol. Clark, Katherine K, NP  Active Self  carvedilol  (COREG ) 6.25 MG tablet [id number] Yes Take 1 tablet (6.25 mg total) by mouth 2 (two) times daily. Darron Deatrice LABOR, MD  Active   clobetasol  cream (TEMOVATE ) 0.05 % [id number] Yes Apply 1 Application topically 2 (two) times daily as needed. For vaginal itching. Clark, Katherine K, NP   Active Self  clopidogrel  (PLAVIX ) 75 MG tablet [id number] Yes Take 1 tablet (75 mg total) by mouth daily. Darron Deatrice LABOR, MD  Active Self  Continuous Glucose Receiver (FREESTYLE LIBRE 2 READER) DEVI [id number] Yes Use with sensor to check blood sugars 6 times daily. Gretta Comer POUR, NP  Active Self  Continuous Glucose Sensor (FREESTYLE LIBRE 2 SENSOR) [place] [id number] Yes USE TO CHECK BLOOD SUGAR CHANGE EVERY 14 DAYS Clark, Katherine K, NP  Active Self  dapagliflozin  propanediol (FARXIGA ) 10 MG TABS tablet [id number] Yes Take 1 tablet (10 mg total) by mouth daily. Caleen Qualia, MD  Active   Evolocumab  (REPATHA  SURECLICK) 140 MG/ML EMMANUEL [id number] Yes Inject 140 mg into the skin every 14 (fourteen) days. Darron Deatrice LABOR, MD  Active   ezetimibe  (ZETIA ) 10 MG tablet [id number] Yes Take 1 tablet (10 mg total) by mouth daily. For cholesterol. Gretta Comer  K, NP  Active Self  furosemide  (LASIX ) 40 MG tablet [id number] Yes Take 1 tablet (40 mg total) by mouth every Monday, Wednesday, and Friday. Amin, Sumayya, MD  Active   gabapentin  (NEURONTIN ) 300 MG capsule [id number] Yes Take 1 capsule (300 mg total) by mouth daily. May take an additional dose throughout the day if needed for pain  Patient taking differently: Take 300 mg by mouth daily as needed (Pain).   Clark, Katherine K, NP  Active Self  Glucagon  (GVOKE HYPOPEN  2-PACK) 1 MG/0.2ML SOAJ [id number] Yes Inject 1 mg (1 pen) as needed for severe low blood sugar (sustained glucose less than 55 despite oral glucose treatments). May repeat in 15 minutes as needed. Gretta Comer POUR, NP  Active Self  insulin  aspart (NOVOLOG  FLEXPEN) 100 UNIT/ML FlexPen [id number] Yes INJECT 5 UNITS SUBCUTANEOUSLY THREE TIMES DAILY WITH MEALS PER SLIDING SCALE (FOR DIABETES) Gretta Comer POUR, NP  Active Self  Insulin  Pen Needle (INSUPEN PEN NEEDLES) 32G X 4 MM MISC [id number] Yes Use to inject insulin  4 times daily. Clark, Katherine K, NP  Active Self  isosorbide   mononitrate (IMDUR ) 60 MG 24 hr tablet [id number] Yes Take 1 tablet (60 mg total) by mouth daily. Caleen Qualia, MD  Active   levothyroxine  (SYNTHROID ) 100 MCG tablet [id number] Yes TAKE 1 TABLET BY MOUTH IN THE MORNING ON  AN  EMPTY  STOMACH  WITH  WATER   ONLY.  NO  FOOD  OR  OTHER  MEDICATIONS  FOR  30  MINUTES Clark, Katherine K, NP  Active Self  mirtazapine  (REMERON ) 15 MG tablet [id number] Yes Take 1 tablet (15 mg total) by mouth at bedtime. For sleep  Patient taking differently: Take 15 mg by mouth at bedtime as needed (sleep).   Clark, Katherine K, NP  Active Self  nitroGLYCERIN  (NITROSTAT ) 0.4 MG SL tablet [id number] Yes Place 1 tablet (0.4 mg total) under the tongue every 5 (five) minutes as needed for chest pain. Darron Deatrice LABOR, MD  Active Self  ondansetron  (ZOFRAN ) 4 MG tablet [id number] Yes Take 1 tablet (4 mg total) by mouth every 6 (six) hours as needed for nausea. Clark, Katherine K, NP  Active Self  potassium chloride  (KLOR-CON  M) 10 MEQ tablet [id number] Yes Take 10 mEq by mouth every Monday, Wednesday, and Friday. [provider]  Active Self  sacubitril -valsartan  (ENTRESTO ) 24-26 MG [id number] Yes Take 1 tablet by mouth 2 (two) times daily. Caleen Qualia, MD  Active   sitaGLIPtin  (JANUVIA ) 100 MG tablet [id number] Yes Take 1 tablet (100 mg total) by mouth daily. for diabetes. Clark, Katherine K, NP  Active Self           Med Note CARALYN, LINDSAY R   Tue Oct 06, 2023  8:22 AM)    umeclidinium-vilanterol (ANORO ELLIPTA ) 62.5-25 MCG/ACT AEPB [id number] Yes Inhale 1 puff into the lungs daily. Isadora Hose, MD  Active Self  Med List Note Kerrin Eleanor SAUNDERS, CPhT 05/12/24 1346):              Goals Addressed             This Visit's Progress    VBCI Transitions of Care (TOC) Care Plan       Problems:  Recent Hospitalization for treatment of Acute respiratory failure with hypoxia Knowledge Deficit Related to Acute respiratory failure with hypoxia  Diabetes management.    Goal:  Over the next 30 days, the patient will not experience hospital readmission  Interventions:  Transitions of Care: Doctor  Visits  - discussed the importance of doctor visits Reviewed upcoming provider visits Medications reviewed and compliance with medications discussed. Assessed breathing status Reviewed and assessed for signs of HF symptoms. Assessed for weight Assessed blood sugar readings Confirmed home health services active Advised to monitor blood pressure when experiencing lightheadedness and record readings.  Advised if having low blood pressures to notify provider Discussed hypoglycemia/ hyperglycemia management. Discussed rule of 15.   Patient Self Care Activities:  Attend all scheduled provider appointments Call pharmacy for medication refills 3-7 days in advance of running out of medications Call provider office for new concerns or questions  Notify RN Care Manager of TOC call rescheduling needs Participate in Transition of Care Program/Attend TOC scheduled calls Take medications as prescribed   Monitor blood pressure and record readings if experiencing lightheadedness.  Notify provider for blood pressures under 100/70 monitor for HF symptoms.  Notify provider for increase in heart failure symptoms.  Seek emergency medical services for severe heart failure symptoms such as shortness of breath and/ or chest pain FOLLOW RULE OF 15 HYPOGLYCEMIC MANAGEMENT   Check blood glucose If it's below 70?mg/dL (or under 44-29?fh/iO depending on guidelines), you should treat.   Consume 15?grams of fast-acting carbohydrates Options include:  3-4 glucose tablets  cup (4?oz) of fruit juice or regular soda 1 tablespoon (about 15?g) of sugar, honey, or corn syrup 1 tube of glucose gel.   Wait 15 minutes, then recheck blood glucose.   If still low (<70?mg/dL): Repeat the process--another 15?g fast carbs and recheck after 15 minutes.   Once blood glucose is normalized  (>70?mg/dL): Eat a balanced snack or meal with carbs and protein (e.g., crackers with cheese, yogurt with fruit, sandwich) to stabilize levels.    Plan:  Telephone follow up appointment with care management team member scheduled for: Skylar Priest 08/25/24/2026 11 am        Recommendation:   PCP Follow-up Specialty provider follow-up 08/24/24 Continue Current Plan of Care  Follow Up Plan:   Telephone follow-up in 1 week  Arvin Seip RN, BSN, CCM [place]  [facility], Population Health Case Manager Phone: [phone]     "

## 2024-08-25 ENCOUNTER — Telehealth: Payer: Self-pay

## 2024-08-25 NOTE — Transitions of Care (Post Inpatient/ED Visit) (Signed)
 " Transition of Care week#5  Visit Note  08/25/2024  Name: Sarah Payne MRN: [id number]          DOB: 27-Oct-1945  Situation: Patient enrolled in [facility] 30-day program. Visit completed with patient by telephone.   Background:   Initial Transition Care Management Follow-up Telephone Call Discharge Date and Diagnosis: 07/26/24, Acute respiratory failure with hypoxia   Past Medical History:  Diagnosis Date   Acute pain of left shoulder 01/17/2019   Aortic atherosclerosis    CHF (congestive heart failure) (HCC) 09/16/2013   a.) TTE 09/16/2013: EF 55-60%; mod inferior HK, triv TR; G1DD. b.) TTE 01/02/2016: EF 25-30%; mid-apicalateroseptal, lat, inf, inferoseptal, apical akinesis; triv TR; G1DD. c. TTE 04/16/2016: EF 65%; G1DD. d.) TTE 05/02/2020: EF 50-55%; G2DD. e.) TTE 04/11/2021: EF 55%; G1DD.   Chronic low back pain with bilateral sciatica    Chronic painful diabetic neuropathy (HCC)    Chronic radicular lumbar pain    CKD (chronic kidney disease), stage III (HCC)    Clostridioides difficile infection 2015   COPD (chronic obstructive pulmonary disease) (HCC)    Coronary artery disease    a.) PCI 02/06/2011: 100% mLCx - DES x 3. b.) PCI 09/17/2011: 95% dRCA - DES x 1. c.) PCI 10/02/2011: 75% pLCX - DES x 1. d.) PCI 08/19/2012: DES x 2 p-mRCA.  e.) LHC 01/01/16: Takotsubo event 01/01/2016 with patent stents   Depression    Diverticulosis    Failed back syndrome    Frequent PVCs    a. Noted in hospital 12/2015.   GERD (gastroesophageal reflux disease)    History of bilateral cataract extraction 01/2015   History of heart artery stent    a.) TOTAL of 7 stents --> 02/06/2011 - overlapping 2.5 x 12 mm Xience V, 2.5 x 8 mm Xience V, 2.25 x 8 mm Xience Nano to mLCx; 09/17/2011 - 2.5 x 23 Xience V dRCA; 10/02/2011 - 2.5 x 15 mm Xience V pLCx; 08/09/2012 - overlapping 3.0 x 38mm mRCA and 3.5 x 23mm pRCA   Hyperlipidemia    Hypertension    Hypothyroidism    Impingement syndrome of left shoulder     LBBB (left bundle branch block)    Long term current use of clopidogrel     Marijuana abuse    Myocardial infarction [facility])    a.) multiple MIs;  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 12/2015 - nephew committed suicide 1 week prior, sister died the morning of presentation - initially called a STEMI; cath with patent stents. LVEF 25-30%.   Tendonitis of left rotator cuff    Tobacco abuse    Vaginal burning 03/22/2019   Vascular dementia     Assessment: Patient Reported Symptoms: Cognitive Cognitive Status: Alert and oriented to person, place, and time, Insightful and able to interpret abstract concepts, Normal speech and language skills, No symptoms reported      Neurological Neurological Review of Symptoms: No symptoms reported  HEENT HEENT Symptoms Reported: No symptoms reported      Cardiovascular Cardiovascular Symptoms Reported: No symptoms reported Cardiovascular Comment: patient reports lightheadedness has resolved.  Respiratory Respiratory Symptoms Reported: No symptoms reported    Endocrine Endocrine Symptoms Reported: No symptoms reported Is patient diabetic?: Yes Is patient checking blood sugars at home?: Yes List most recent blood sugar readings, include date and time of day: patient reports her fasting blood sugar today 08/25/24 was 140    Gastrointestinal Gastrointestinal Symptoms Reported: No symptoms reported      Genitourinary Genitourinary Symptoms Reported: No symptoms reported    Integumentary Integumentary Symptoms Reported: No  symptoms reported    Musculoskeletal Musculoskelatal Symptoms Reviewed: No symptoms reported        Psychosocial Psychosocial Symptoms Reported: No symptoms reported         There were no vitals filed for this visit. Pain Scale: 0-10 Pain Score: 0-No pain  Medications Reviewed Today     Reviewed by Dontez Hauss E, RN (Registered Nurse) on 08/25/24 at 1034  Med List Status: <None>   Medication Order Taking? Sig Documenting Provider Last Dose Status Informant  acetaminophen  (TYLENOL ) 650 MG CR tablet [id number] Yes Take 1,300 mg by mouth every 8 (eight) hours as needed for pain. [provider]  Active Self  albuterol  (VENTOLIN  HFA) 108 (90 Base) MCG/ACT inhaler [id number] Yes Inhale 1-2 puffs into the lungs every 6 (six) hours as needed for wheezing or shortness of breath. [provider]  Active Self  aspirin  EC 81 MG tablet [id number] Yes Take 1 tablet (81 mg total) by mouth daily. Swallow whole. Darron Deatrice LABOR, MD  Active Self  atorvastatin  (LIPITOR ) 80 MG tablet [id number] Yes Take 1 tablet (80 mg total) by mouth daily. for cholesterol. Clark, Katherine K, NP  Active Self  carvedilol  (COREG ) 6.25 MG tablet [id number] Yes Take 1 tablet (6.25 mg total) by mouth 2 (two) times daily. Darron Deatrice LABOR, MD  Active   clobetasol  cream (TEMOVATE ) 0.05 % [id number] Yes Apply 1 Application topically 2 (two) times daily as needed. For vaginal itching. Clark, Katherine K, NP  Active Self  clopidogrel  (PLAVIX ) 75 MG tablet [id number] Yes Take 1 tablet (75 mg total) by mouth daily. Darron Deatrice LABOR, MD  Active Self  Continuous Glucose Receiver (FREESTYLE LIBRE 2 READER) [place]VI [id number] Yes Use with sensor to check blood sugars 6 times daily. Gretta Comer POUR, NP  Active Self  Continuous Glucose Sensor (FREESTYLE LIBRE 2 SENSOR) [place] [id number] Yes USE TO CHECK BLOOD SUGAR CHANGE EVERY 14 DAYS Clark, Katherine K, NP  Active Self  dapagliflozin  propanediol (FARXIGA ) 10 MG TABS tablet  [id number] Yes Take 1 tablet (10 mg total) by mouth daily. Caleen Qualia, MD  Active   Evolocumab  (REPATHA  SURECLICK) 140 MG/ML EMMANUEL [id number] Yes Inject 140 mg into the skin every 14 (fourteen) days. Darron Deatrice LABOR, MD  Active   ezetimibe  (ZETIA ) 10 MG tablet [id number] Yes Take 1 tablet (10 mg total) by mouth daily. For cholesterol. Clark, Katherine K, NP  Active Self  furosemide  (LASIX ) 40 MG tablet [id number] Yes Take 1 tablet (40 mg total) by mouth every Monday, Wednesday, and Friday. Amin, Sumayya, MD  Active   gabapentin  (NEURONTIN ) 300 MG capsule [id number] Yes Take 1 capsule (300 mg total) by mouth daily. May take an additional dose throughout the day if needed for pain  Patient taking differently: Take 300 mg by mouth daily as needed (Pain).   Clark, Katherine K, NP  Active Self  Glucagon  (GVOKE HYPOPEN  2-PACK) 1 MG/0.2ML SOAJ [id number] Yes Inject 1 mg (1 pen) as needed for severe low blood sugar (sustained glucose less than 55 despite oral glucose treatments). May repeat in 15 minutes as needed. Gretta Comer POUR, NP  Active Self  insulin  aspart (NOVOLOG  FLEXPEN) 100 UNIT/ML FlexPen [id number] Yes INJECT 5 UNITS SUBCUTANEOUSLY THREE TIMES DAILY WITH MEALS PER SLIDING SCALE (FOR DIABETES) Gretta Comer POUR, NP  Active Self  Insulin  Pen Needle (INSUPEN PEN NEEDLES) 32G X 4 MM MISC [id number] Yes Use to inject insulin  4 times daily. Clark, Katherine K, NP  Active Self  isosorbide  mononitrate (IMDUR ) 60 MG 24 hr tablet [id number] Yes Take 1 tablet (60 mg total) by mouth daily. Caleen Qualia, MD  Active   levothyroxine  (SYNTHROID ) 100 MCG tablet [id number] Yes TAKE 1 TABLET BY MOUTH IN THE MORNING ON  AN  EMPTY  STOMACH  WITH  WATER   ONLY.  NO  FOOD  OR  OTHER  MEDICATIONS  FOR  30  MINUTES Clark, Katherine K, NP  Active Self  mirtazapine  (REMERON ) 15 MG tablet [id number] Yes Take 1 tablet (15 mg total) by mouth at bedtime. For sleep  Patient taking differently: Take 15 mg by mouth at bedtime as  needed (sleep).   Clark, Katherine K, NP  Active Self  nitroGLYCERIN  (NITROSTAT ) 0.4 MG SL tablet [id number] Yes Place 1 tablet (0.4 mg total) under the tongue every 5 (five) minutes as needed for chest pain. Darron Deatrice LABOR, MD  Active Self  ondansetron  (ZOFRAN ) 4 MG tablet [id number] Yes Take 1 tablet (4 mg total) by mouth every 6 (six) hours as needed for nausea. Clark, Katherine K, NP  Active Self  potassium chloride  (KLOR-CON  M) 10 MEQ tablet [id number] Yes Take 10 mEq by mouth every Monday, Wednesday, and Friday. [provider]  Active Self  sacubitril -valsartan  (ENTRESTO ) 24-26 MG [id number] Yes Take 1 tablet by mouth 2 (two) times daily. Amin, Sumayya, MD  Active   sitaGLIPtin  (JANUVIA ) 100 MG tablet [id number] Yes Take 1 tablet (100 mg total) by mouth daily. for diabetes. Clark, Katherine K, NP  Active Self           Med Note CARALYN, LINDSAY R   Tue Oct 06, 2023  8:22 AM)    umeclidinium-vilanterol (ANORO ELLIPTA ) 62.5-25 MCG/ACT AEPB [id number] Yes Inhale 1 puff into the lungs daily. Isadora Hose, MD  Active Self  Med List Note Kerrin Eleanor SAUNDERS, CPhT 05/12/24 1346):              Goals Addressed             This Visit's Progress    COMPLETED: VBCI Transitions of Care (TOC) Care Plan       Problems:  Recent Hospitalization for treatment of Acute respiratory failure with hypoxia Knowledge Deficit Related to Acute respiratory failure with hypoxia  Diabetes management.   Goal:  Over the next 30 days, the patient will not experience hospital readmission  Interventions:  Transitions of Care: Doctor Visits  - discussed the importance of doctor visits Reviewed upcoming provider visits Medications reviewed and compliance with medications discussed. Assessed breathing status Reviewed and assessed for signs of HF symptoms. Assessed blood sugar readings Re-Advised to monitor blood pressure when experiencing lightheadedness and record readings.  Advised if having low  blood pressures  and/ or ongoing lightheadedness to notify provider- patient reports she has a blood pressure monitor however is not monitoring her blood pressure at this time.   Patient Self  Care Activities:  Attend all scheduled provider appointments Call pharmacy for medication refills 3-7 days in advance of running out of medications Call provider office for new concerns or questions  Take medications as prescribed   Monitor blood pressure and record readings if experiencing lightheadedness.  Notify provider for blood pressures under 100/70 monitor for HF symptoms.  Notify provider for increase in heart failure symptoms.  Seek emergency medical services for severe heart failure symptoms such as shortness of breath and/ or chest pain FOLLOW RULE OF 15 HYPOGLYCEMIC MANAGEMENT   Check blood glucose If it's below 70?mg/dL (or under 44-29?fh/iO depending on guidelines), you should treat.   Consume 15?grams of fast-acting carbohydrates Options include:  3-4 glucose tablets  cup (4?oz) of fruit juice or regular soda 1 tablespoon (about 15?g) of sugar, honey, or corn syrup 1 tube of glucose gel.   Wait 15 minutes, then recheck blood glucose.   If still low (<70?mg/dL): Repeat the process--another 15?g fast carbs and recheck after 15 minutes.   Once blood glucose is normalized (>70?mg/dL): Eat a balanced snack or meal with carbs and protein (e.g., crackers with cheese, yogurt with fruit, sandwich) to stabilize levels.    Plan:  No further follow up calls with patient at this time.  Patient completed the 30 day TOC program and met program goals.          Recommendation:   Continue Current Plan of Care  Follow Up Plan:   Closing From:  Transitions of Care Program Patient has completed the 30 day TOC program and met her program goals.   Arvin Seip RN, BSN, CCM [employer], Population Health Case Manager Phone: [phone]     "

## 2024-08-25 NOTE — Patient Instructions (Signed)
 Visit Information  Thank you for taking time to visit with me today. You have completed the 30 day TOC program and met your program goals. Please contact your primary care provider if you have any further needs or concerns.   Following is a copy of your care plan:   Goals Addressed             This Visit's Progress    COMPLETED: VBCI Transitions of Care (TOC) Care Plan       Problems:  Recent Hospitalization for treatment of Acute respiratory failure with hypoxia Knowledge Deficit Related to Acute respiratory failure with hypoxia  Diabetes management.   Goal:  Over the next 30 days, the patient will not experience hospital readmission  Interventions:  Transitions of Care: Doctor Visits  - discussed the importance of doctor visits Reviewed upcoming provider visits Medications reviewed and compliance with medications discussed. Assessed breathing status Reviewed and assessed for signs of HF symptoms. Assessed blood sugar readings Re-Advised to monitor blood pressure when experiencing lightheadedness and record readings.  Advised if having low blood pressures  and/ or ongoing lightheadedness to notify provider- patient reports she has a blood pressure monitor however is not monitoring her blood pressure at this time.   Patient Self Care Activities:  Attend all scheduled provider appointments Call pharmacy for medication refills 3-7 days in advance of running out of medications Call provider office for new concerns or questions  Take medications as prescribed   Monitor blood pressure and record readings if experiencing lightheadedness.  Notify provider for blood pressures under 100/70 monitor for HF symptoms.  Notify provider for increase in heart failure symptoms.  Seek emergency medical services for severe heart failure symptoms such as shortness of breath and/ or chest pain FOLLOW RULE OF 15 HYPOGLYCEMIC MANAGEMENT   Check blood glucose If it's below 70?mg/dL (or under  44-29?fh/iO depending on guidelines), you should treat.   Consume 15?grams of fast-acting carbohydrates Options include:  3-4 glucose tablets  cup (4?oz) of fruit juice or regular soda 1 tablespoon (about 15?g) of sugar, honey, or corn syrup 1 tube of glucose gel.   Wait 15 minutes, then recheck blood glucose.   If still low (<70?mg/dL): Repeat the process--another 15?g fast carbs and recheck after 15 minutes.   Once blood glucose is normalized (>70?mg/dL): Eat a balanced snack or meal with carbs and protein (e.g., crackers with cheese, yogurt with fruit, sandwich) to stabilize levels.    Plan:  No further follow up calls with patient at this time.  Patient completed the 30 day TOC program and met program goals.         Patient verbalizes understanding of instructions and care plan provided today and agrees to view in MyChart. Active MyChart status and patient understanding of how to access instructions and care plan via MyChart confirmed with patient.     No further follow up required: Completed the 30 day TOC program.   Please call the care guide team at [phone] if you need to cancel or reschedule your appointment.   Please call the Suicide and Crisis Lifeline: 988 call the [place]  National Suicide Prevention Lifeline: (303) 049-0558 or TTY: (858)258-8259 TTY 951-337-0524) to talk to a trained counselor call 1-800-273-TALK (toll free, 24 hour hotline) if you are experiencing a Mental Health or Behavioral Health Crisis or need someone to talk to.  Arvin Seip RN, BSN, CCM [employer], Population Health Case Manager Phone: [phone]

## 2024-08-26 ENCOUNTER — Ambulatory Visit: Admitting: Primary Care

## 2024-08-26 ENCOUNTER — Encounter: Payer: Self-pay | Admitting: Primary Care

## 2024-08-26 VITALS — BP 132/64 | HR 50 | Temp 97.9°F | Ht 66.0 in | Wt 133.5 lb

## 2024-08-26 DIAGNOSIS — E1151 Type 2 diabetes mellitus with diabetic peripheral angiopathy without gangrene: Secondary | ICD-10-CM

## 2024-08-26 DIAGNOSIS — I5022 Chronic systolic (congestive) heart failure: Secondary | ICD-10-CM

## 2024-08-26 MED ORDER — FREESTYLE LIBRE 2 SENSOR MISC: 3 refills | Status: AC

## 2024-08-26 NOTE — Assessment & Plan Note (Signed)
 Acute on chronic with recent hospitalization Hospital notes, labs, imaging reviewed.  Fortunately, today she appears euvolemic and nonseptic. She appears much better than she has previously during our visits.  Continue Entresto  24-26, carvedilol  6.25 mg BID, furosemide  40 mg three days weekly. Follow-up with heart failure clinic next week is scheduled  We will plan to see her for follow-up in 1 month

## 2024-08-26 NOTE — Progress Notes (Signed)
 "  Subjective:    Patient ID: Sarah Payne, female    DOB: 1946-01-17, 79 y.o.   MRN: [id number]  Sarah Payne is a very pleasant 79 y.o. female with a significant medical history including CAD, CHF, cardiomyopathy, type 2 diabetes, COPD, sleep apnea, community-acquired pneumonia, chronic pain syndrome, CKD, tobacco use who presents today for hospital follow-up. She is also needing a refill of her freestyle libre sensors  She presented to [facility] ED on 07/21/2024 via EMS for progressing shortness of breath and an oxygen saturation in the 70s with 2 L of oxygen per nasal cannula.  EMS had difficulty increasing her saturation was able to get her up to 85% on 15 L.  She had a cardiac stent placed 3 weeks earlier.  While in the emergency department her proBNP was 8644, troponin 162, chest x-ray with flash pulmonary edema.  She was initiated on BiPAP.  She was treated with IV Lasix  and admitted for further evaluation for acute hypoxia secondary to viral illness and acute CHF  Cardiology consulted for wide-complex tachycardia concerning for atrial fibs/flutter, left bundle branch block.  New compared to prior EKG.  She underwent echocardiogram which showed severe drop in ejection fraction to 25 to 30%.  She was initiated on heparin  infusion, nitroglycerin  infusion, IV Lasix  twice daily, IV antibiotics.  She underwent cardiac catheterization which was negative for new abnormality.  She was initiated on low-dose Entresto , oral furosemide .  Plan is to gradually add GDMT per cardiology.  She was discharged home on 07/26/2024 with recommendations for outpatient cardiology follow-up and home health nursing  Since her hospital stay she was evaluated by the heart failure team on 08/01/2024.  There were no medication changes.  Evaluated by cardiology on 08/04/2024.  Her carvedilol  was switched to carvedilol  from metoprolol  succinate without plans to add spironolactone  due to worsening renal function.   Today she's feeling a  lot better. Her cough and shortness of breath has improved. She's not on supplemental oxygen. She has been visited by home health nursing and PT. She is also followed by nursing case management with [facility]. She now has hired help in her home for household chores.     Review of Systems  Respiratory:  Positive for cough. Negative for shortness of breath.   Cardiovascular:  Negative for chest pain and leg swelling.         Past Medical History:  Diagnosis Date   Acute pain of left shoulder 01/17/2019   Aortic atherosclerosis    CHF (congestive heart failure) (HCC) 09/16/2013   a.) TTE 09/16/2013: EF 55-60%; mod inferior HK, triv TR; G1DD. b.) TTE 01/02/2016: EF 25-30%; mid-apicalateroseptal, lat, inf, inferoseptal, apical akinesis; triv TR; G1DD. c. TTE 04/16/2016: EF 65%; G1DD. d.) TTE 05/02/2020: EF 50-55%; G2DD. e.) TTE 04/11/2021: EF 55%; G1DD.   Chronic low back pain with bilateral sciatica    Chronic painful diabetic neuropathy (HCC)    Chronic radicular lumbar pain    CKD (chronic kidney disease), stage III (HCC)    Clostridioides difficile infection 2015   COPD (chronic obstructive pulmonary disease) (HCC)    Coronary artery disease    a.) PCI 02/06/2011: 100% mLCx - DES x 3. b.) PCI 09/17/2011: 95% dRCA - DES x 1. c.) PCI 10/02/2011: 75% pLCX - DES x 1. d.) PCI 08/19/2012: DES x 2 p-mRCA.  e.) LHC 01/01/16: Takotsubo event 01/01/2016 with patent stents   Depression    Diverticulosis    Failed back syndrome  Frequent PVCs    a. Noted in hospital 12/2015.   GERD (gastroesophageal reflux disease)    History of bilateral cataract extraction 01/2015   History of heart artery stent    a.) TOTAL of 7 stents --> 02/06/2011 - overlapping 2.5 x 12 mm Xience V, 2.5 x 8 mm Xience V, 2.25 x 8 mm Xience Nano to mLCx; 09/17/2011 - 2.5 x 23 Xience V dRCA; 10/02/2011 - 2.5 x 15 mm Xience V pLCx; 08/09/2012 - overlapping 3.0 x 38mm mRCA and 3.5 x 23mm pRCA   Hyperlipidemia    Hypertension     Hypothyroidism    Impingement syndrome of left shoulder    LBBB (left bundle branch block)    Long term current use of clopidogrel     Marijuana abuse    Myocardial infarction [facility])    a.) multiple MIs;  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 12/2015 - nephew committed suicide 1 week prior, sister died the morning of presentation - initially called a STEMI; cath with patent stents. LVEF 25-30%.   Tendonitis of left rotator cuff    Tobacco abuse    Vaginal burning 03/22/2019   Vascular dementia     Social History   Socioeconomic History   Marital status: Married    Spouse name: Laurier   Number of children: Not on file   Years of education: Not on file   Highest education level: Not on file  Occupational History   Not on file  Tobacco Use   Smoking status: Former    Current packs/day: 1.00    Average packs/day: 1 pack/day for 45.0 years (45.0 ttl pk-yrs)    Types: Cigarettes   Smokeless tobacco: Never   Tobacco comments:    Has cut back, trying to quit. 1 pack/day per patient.  Vaping Use   Vaping status: Some Days   Substances: Nicotine   Substance and Sexual Activity   Alcohol use: No    Alcohol/week: 0.0 standard drinks of alcohol   Drug use: Not Currently   Sexual activity: Not on file  Other Topics Concern   Not on file  Social History Narrative   Lives at home with her husband in [place].  Previously used marijuana - quit.      Regular exercise: no/ pain from a frozen rotator cuff   Caffeine use: coffee daily and pepsi      Does not  have a living will.   Daughters and husband know her wishes- would desire CPR but not prolonged life support if futile   Social Drivers of Health   Tobacco Use: Medium Risk (08/26/2024)   Patient History    Smoking Tobacco Use: Former    Smokeless Tobacco Use: Never    Passive Exposure: Not on file  Financial Resource Strain: Low Risk (10/22/2023)   Overall Financial Resource Strain (CARDIA)    Difficulty of Paying Living Expenses: Not hard at all  Food Insecurity: No Food Insecurity (07/27/2024)  Epic    Worried About Programme Researcher, Broadcasting/film/video in the Last Year: Never true    [employer] of Food in the Last Year: Never true  Transportation Needs: No Transportation Needs (07/27/2024)   Epic    Lack of Transportation (Medical): No    Lack of Transportation (Non-Medical): No  Physical Activity: Insufficiently Active (10/22/2023)   Exercise Vital Sign    Days of Exercise per Week: 3 days    Minutes of Exercise per Session: 20 min  Stress: No Stress Concern Present (10/22/2023)   [employer] of Occupational Health - Occupational Stress Questionnaire    Feeling of Stress : Only a little  Social Connections: Moderately Isolated (07/23/2024)   Social Connection and Isolation Panel    Frequency of Communication with Friends and Family: More than three times a week    Frequency of Social Gatherings with Friends and Family: Once a week    Attends Religious Services: Never    Database Administrator or Organizations: No    Attends Banker Meetings: Never    Marital Status: Married  Catering Manager Violence: Not At Risk (07/27/2024)   Epic    Fear of Current or Ex-Partner: No    Emotionally Abused: No    Physically Abused: No    Sexually Abused: No  Depression (PHQ2-9): Medium Risk (08/26/2024)   Depression (PHQ2-9)    PHQ-2 Score: 9  Alcohol Screen: Low Risk (10/22/2023)   Alcohol Screen    Last Alcohol Screening Score (AUDIT): 0  Housing: Low Risk (07/27/2024)   Epic    Unable to Pay for  Housing in the Last Year: No    Number of Times Moved in the Last Year: 0    Homeless in the Last Year: No  Utilities: Not At Risk (07/27/2024)   Epic    Threatened with loss of utilities: No  Health Literacy: Adequate Health Literacy (10/22/2023)   B1300 Health Literacy    Frequency of need for help with medical instructions: Never    Past Surgical History:  Procedure Laterality Date   ABDOMINAL AORTOGRAM W/LOWER EXTREMITY N/A 09/12/2020   Procedure: ABDOMINAL AORTOGRAM W/LOWER EXTREMITY;  Surgeon: Darron Deatrice LABOR, MD;  Location: [facility] INVASIVE CV LAB;  Service: Cardiovascular;  Laterality: N/A;   ABDOMINAL HYSTERECTOMY     APPENDECTOMY     BACK SURGERY     CARDIAC CATHETERIZATION N/A 01/01/2016   Procedure: Left Heart Cath and Coronary Angiography;  Surgeon: Candyce GORMAN Reek, MD;  Location: [facility] INVASIVE CV LAB;  Service: Cardiovascular;  Laterality: N/A;   CATARACT EXTRACTION W/PHACO Left 01/30/2015   Procedure: CATARACT EXTRACTION PHACO AND INTRAOCULAR LENS PLACEMENT (IOC);  Surgeon: Elsie Carmine, MD;  Location: [facility] ORS;  Service: Ophthalmology;  Laterality: Left;  US  00:47    CATARACT EXTRACTION W/PHACO Right 02/13/2015   Procedure: CATARACT EXTRACTION PHACO AND INTRAOCULAR LENS PLACEMENT (IOC);  Surgeon: Elsie Carmine, MD;  Location: [facility] ORS;  Service: Ophthalmology;  Laterality: Right;  cassette lot # [id number] H US   00:29.9 AP  20.7 CDE  6.20   COLONOSCOPY N/A 11/02/2014   Procedure: COLONOSCOPY;  Surgeon: Lamar JONETTA Aho, MD;  Location: [facility] ENDOSCOPY;  Service: Endoscopy;  Laterality: N/A;   CORONARY ANGIOPLASTY WITH STENT PLACEMENT Left 02/06/2011   Procedure: CORONARY ANGIOPLASTY WITH STENT PLACEMENT; Location: [facility]; Surgeon: Margie Lovelace, MD   CORONARY ANGIOPLASTY WITH STENT PLACEMENT Left 09/17/2011   Procedure: CORONARY ANGIOPLASTY WITH STENT PLACEMENT; Location: [facility]; Surgeon: Margie Lovelace, MD   CORONARY ANGIOPLASTY WITH  STENT PLACEMENT Left 10/02/2011    Procedure: CORONARY ANGIOPLASTY WITH STENT PLACEMENT; Location: [facility]; Surgeon: Margie Lovelace, MD   CORONARY ANGIOPLASTY WITH STENT PLACEMENT Left 08/19/2012   Procedure: CORONARY ANGIOPLASTY WITH STENT PLACEMENT; Location: [facility]; Surgeon: Deatrice Cage, MD   CORONARY IMAGING/OCT N/A 06/30/2024   Procedure: CORONARY IMAGING/OCT;  Surgeon: Cage Deatrice LABOR, MD;  Location: [facility]VASIVE CV LAB;  Service: Cardiovascular;  Laterality: N/A;   CORONARY LITHOTRIPSY N/A 06/30/2024   Procedure: CORONARY LITHOTRIPSY;  Surgeon: Cage Deatrice LABOR, MD;  Location: [facility] INVASIVE CV LAB;  Service: Cardiovascular;  Laterality: N/A;   CORONARY STENT INTERVENTION N/A 06/30/2024   Procedure: CORONARY STENT INTERVENTION;  Surgeon: Cage Deatrice LABOR, MD;  Location: [facility]IVE [facility] LAB;  Service: Cardiovascular;  Laterality: N/A;   ESOPHAGOGASTRODUODENOSCOPY (EGD) WITH PROPOFOL  N/A 09/21/2018   Procedure: ESOPHAGOGASTRODUODENOSCOPY (EGD) WITH PROPOFOL ;  Surgeon: Therisa Bi, MD;  Location: [facility] ENDOSCOPY;  Service: Gastroenterology;  Laterality: N/A;   ESOPHAGOGASTRODUODENOSCOPY (EGD) WITH PROPOFOL  N/A 10/06/2022   Procedure: ESOPHAGOGASTRODUODENOSCOPY (EGD) WITH PROPOFOL ;  Surgeon: Therisa Bi, MD;  Location: [facility] ENDOSCOPY;  Service: Gastroenterology;  Laterality: N/A;   EYE SURGERY     LEFT HEART CATH AND CORONARY ANGIOGRAPHY Left 08/02/2014   Procedure: LEFT HEART CATH AND CORONARY ANGIOGRAPHY; Location: [facility]; Surgeon: Alm Clay, MD   PERIPHERAL VASCULAR ATHERECTOMY Left 09/12/2020   Procedure: PERIPHERAL VASCULAR ATHERECTOMY;  Surgeon: Cage Deatrice LABOR, MD;  Location: [facility] INVASIVE CV LAB;  Service: Cardiovascular;  Laterality: Left;   PERIPHERAL VASCULAR BALLOON ANGIOPLASTY  09/12/2020   Procedure: PERIPHERAL VASCULAR BALLOON ANGIOPLASTY;  Surgeon: Cage Deatrice LABOR, MD;  Location: [facility]ASIVE CV LAB;  Service: Cardiovascular;;   REVERSE SHOULDER ARTHROPLASTY Left 10/01/2021   Procedure: REVERSE SHOULDER  ARTHROPLASTY WITH BICEPS TENODESIS.;  Surgeon: Edie Norleen PARAS, MD;  Location: [facility] ORS;  Service: Orthopedics;  Laterality: Left;   RIGHT AND LEFT HEART CATH N/A 07/25/2024   Procedure: RIGHT AND LEFT HEART CATH;  Surgeon: Cage Deatrice LABOR, MD;  Location: [facility] INVASIVE CV LAB;  Service: Cardiovascular;  Laterality: N/A;   RIGHT/LEFT HEART CATH AND CORONARY ANGIOGRAPHY Bilateral 05/16/2024   Procedure: RIGHT/LEFT HEART CATH AND CORONARY ANGIOGRAPHY;  Surgeon: Cage Deatrice LABOR, MD;  Location: [facility] INVASIVE CV LAB;  Service: Cardiovascular;  Laterality: Bilateral;   SHOULDER SURGERY Left 2017   THORACIC LAMINECTOMY FOR SPINAL CORD STIMULATOR N/A 10/27/2023   Procedure: THORACIC LAMINECTOMY FOR SPINAL CORD STIMULATOR PLACEMENT WITH INTERNAL PULSE GENERATOR IMPLANT;  Surgeon: Claudene Penne ORN, MD;  Location: [facility] ORS;  Service: Neurosurgery;  Laterality: N/A;  THORACIC LAMINECTOMY FOR SPINAL CORD STIMULATOR PLACEMENT WITH INTERNAL PULSE GENERATOR IMPLANT    Family History  Problem Relation Age of Onset   Heart attack Mother        First MI @ 70 - Died @ 43   Heart disease Mother    Heart disease Father        Died @ 60   Throat cancer Brother    Liver cancer Brother    Colon cancer Sister     Allergies[1]  Medications Ordered Prior to Encounter[2]  BP 132/64   Pulse (!) 50   Temp 97.9 F (36.6 C) (Oral)   Ht 5' 6 (1.676 m)   Wt 133 lb 8 oz (60.6 kg)   SpO2 97%   BMI 21.55 kg/m  Objective:   Physical Exam Constitutional:      Appearance: She is not ill-appearing.  Cardiovascular:     Rate and Rhythm: Normal rate and regular  rhythm.  Pulmonary:     Effort: Pulmonary effort is normal.     Breath sounds: Normal breath sounds.     Comments: Congested cough noted during visit  Musculoskeletal:     Cervical back: Neck supple.     Right lower leg: No edema.     Left lower leg: No edema.  Skin:    General: Skin is warm and dry.  Neurological:     Mental Status: She is alert and  oriented to person, place, and time.  Psychiatric:        Mood and Affect: Mood normal.     Physical Exam        Assessment & Plan:  Chronic systolic congestive heart failure [facility]) Assessment & Plan: Acute on chronic with recent hospitalization Hospital notes, labs, imaging reviewed.  Fortunately, today she appears euvolemic and nonseptic. She appears much better than she has previously during our visits.  Continue Entresto  24-26, carvedilol  6.25 mg BID, furosemide  40 mg three days weekly. Follow-up with heart failure clinic next week is scheduled  We will plan to see her for follow-up in 1 month    Diabetes mellitus type 2 with peripheral artery disease (HCC) -     FreeStyle Libre 2 Sensor; USE TO CHECK BLOOD SUGAR CHANGE EVERY 14 DAYS  Dispense: 6 each; Refill: 3    Assessment and Plan Assessment & Plan         Sarah MARLA Gaskins, NP       [1] No Known Allergies [2]  Current Outpatient Medications on File Prior to Visit  Medication Sig Dispense Refill   acetaminophen  (TYLENOL ) 650 MG CR tablet Take 1,300 mg by mouth every 8 (eight) hours as needed for pain.     albuterol  (VENTOLIN  HFA) 108 (90 Base) MCG/ACT inhaler Inhale 1-2 puffs into the lungs every 6 (six) hours as needed for wheezing or shortness of breath.     aspirin  EC 81 MG tablet Take 1 tablet (81 mg total) by mouth daily. Swallow whole.     atorvastatin  (LIPITOR ) 80 MG tablet Take 1 tablet (80 mg total) by mouth daily. for cholesterol. 90 tablet 2   carvedilol  (COREG ) 6.25 MG tablet Take 1 tablet (6.25 mg total) by mouth 2 (two) times daily. 180 tablet 3   clobetasol  cream (TEMOVATE ) 0.05 % Apply 1 Application topically 2 (two) times daily as needed. For vaginal itching. 30 g 0   clopidogrel  (PLAVIX ) 75 MG tablet Take 1 tablet (75 mg total) by mouth daily. 90 tablet 3   Continuous Glucose Receiver (FREESTYLE LIBRE 2 READER) DEVI Use with sensor to check blood sugars 6 times daily. 1 each 0    dapagliflozin  propanediol (FARXIGA ) 10 MG TABS tablet Take 1 tablet (10 mg total) by mouth daily. 30 tablet 2   Evolocumab  (REPATHA  SURECLICK) 140 MG/ML SOAJ Inject 140 mg into the skin every 14 (fourteen) days. 2 mL 2   ezetimibe  (ZETIA ) 10 MG tablet Take 1 tablet (10 mg total) by mouth daily. For cholesterol. 90 tablet 2   furosemide  (LASIX ) 40 MG tablet Take 1 tablet (40 mg total) by mouth every Monday, Wednesday, and Friday. 30 tablet 2   gabapentin  (NEURONTIN ) 300 MG capsule Take 1 capsule (300 mg total) by mouth daily. May take an additional dose throughout the day if needed for pain (Patient taking differently: Take 300 mg by mouth daily as needed (Pain).) 180 capsule 2   Glucagon  (GVOKE HYPOPEN  2-PACK) 1 MG/0.2ML SOAJ Inject 1 mg (1  pen) as needed for severe low blood sugar (sustained glucose less than 55 despite oral glucose treatments). May repeat in 15 minutes as needed. 0.4 mL 1   insulin  aspart (NOVOLOG  FLEXPEN) 100 UNIT/ML FlexPen INJECT 5 UNITS SUBCUTANEOUSLY THREE TIMES DAILY WITH MEALS PER SLIDING SCALE (FOR DIABETES) 45 mL 1   Insulin  Pen Needle (INSUPEN PEN NEEDLES) 32G X 4 MM MISC Use to inject insulin  4 times daily. 250 each 1   isosorbide  mononitrate (IMDUR ) 60 MG 24 hr tablet Take 1 tablet (60 mg total) by mouth daily. 30 tablet 2   levothyroxine  (SYNTHROID ) 100 MCG tablet TAKE 1 TABLET BY MOUTH IN THE MORNING ON  AN  EMPTY  STOMACH  WITH  WATER   ONLY.  NO  FOOD  OR  OTHER  MEDICATIONS  FOR  30  MINUTES 90 tablet 2   mirtazapine  (REMERON ) 15 MG tablet Take 1 tablet (15 mg total) by mouth at bedtime. For sleep (Patient taking differently: Take 15 mg by mouth at bedtime as needed (sleep).) 90 tablet 2   nitroGLYCERIN  (NITROSTAT ) 0.4 MG SL tablet Place 1 tablet (0.4 mg total) under the tongue every 5 (five) minutes as needed for chest pain. 25 tablet 3   ondansetron  (ZOFRAN ) 4 MG tablet Take 1 tablet (4 mg total) by mouth every 6 (six) hours as needed for nausea. 15 tablet 0    potassium chloride  (KLOR-CON  M) 10 MEQ tablet Take 10 mEq by mouth every Monday, Wednesday, and Friday.     sacubitril -valsartan  (ENTRESTO ) 24-26 MG Take 1 tablet by mouth 2 (two) times daily. 60 tablet 2   sitaGLIPtin  (JANUVIA ) 100 MG tablet Take 1 tablet (100 mg total) by mouth daily. for diabetes. 90 tablet 1   umeclidinium-vilanterol (ANORO ELLIPTA ) 62.5-25 MCG/ACT AEPB Inhale 1 puff into the lungs daily. 30 each 11   No current facility-administered medications on file prior to visit.   "

## 2024-08-26 NOTE — Patient Instructions (Signed)
 We will see you back in the office in 1 month as scheduled  It was a pleasure to see you today!

## 2024-08-30 ENCOUNTER — Ambulatory Visit: Admitting: Family

## 2024-09-21 ENCOUNTER — Ambulatory Visit: Admitting: Primary Care

## 2024-10-25 ENCOUNTER — Ambulatory Visit

## 2024-11-02 ENCOUNTER — Ambulatory Visit

## 2024-11-08 ENCOUNTER — Ambulatory Visit: Admitting: Cardiovascular Disease
